# Patient Record
Sex: Male | Born: 1945 | Race: Black or African American | Hispanic: No | Marital: Married | Smoking: Never smoker
Health system: Southern US, Community
[De-identification: ages and names within clinical notes are randomized; demographics above are authoritative.]

## PROBLEM LIST (undated history)

## (undated) DIAGNOSIS — E119 Type 2 diabetes mellitus without complications: Secondary | ICD-10-CM

## (undated) DIAGNOSIS — F32A Depression, unspecified: Secondary | ICD-10-CM

## (undated) DIAGNOSIS — R062 Wheezing: Secondary | ICD-10-CM

## (undated) DIAGNOSIS — K219 Gastro-esophageal reflux disease without esophagitis: Secondary | ICD-10-CM

## (undated) DIAGNOSIS — I255 Ischemic cardiomyopathy: Secondary | ICD-10-CM

## (undated) DIAGNOSIS — Z95810 Presence of automatic (implantable) cardiac defibrillator: Secondary | ICD-10-CM

## (undated) DIAGNOSIS — I10 Essential (primary) hypertension: Secondary | ICD-10-CM

## (undated) DIAGNOSIS — R55 Syncope and collapse: Secondary | ICD-10-CM

## (undated) DIAGNOSIS — I429 Cardiomyopathy, unspecified: Secondary | ICD-10-CM

## (undated) DIAGNOSIS — J449 Chronic obstructive pulmonary disease, unspecified: Secondary | ICD-10-CM

## (undated) DIAGNOSIS — I214 Non-ST elevation (NSTEMI) myocardial infarction: Secondary | ICD-10-CM

## (undated) DIAGNOSIS — N289 Disorder of kidney and ureter, unspecified: Secondary | ICD-10-CM

## (undated) DIAGNOSIS — Z9289 Personal history of other medical treatment: Secondary | ICD-10-CM

## (undated) DIAGNOSIS — E785 Hyperlipidemia, unspecified: Secondary | ICD-10-CM

## (undated) DIAGNOSIS — I509 Heart failure, unspecified: Secondary | ICD-10-CM

## (undated) DIAGNOSIS — I2510 Atherosclerotic heart disease of native coronary artery without angina pectoris: Secondary | ICD-10-CM

## (undated) DIAGNOSIS — F419 Anxiety disorder, unspecified: Secondary | ICD-10-CM

## (undated) DIAGNOSIS — I428 Other cardiomyopathies: Secondary | ICD-10-CM

## (undated) HISTORY — PX: HERNIA REPAIR: SHX51

## (undated) HISTORY — PX: FRACTURE SURGERY: SHX138

## (undated) HISTORY — DX: Personal history of other medical treatment: Z92.89

## (undated) HISTORY — PX: REPAIR, HAMMERTOE: SHX5250

## (undated) HISTORY — PX: OTHER SURGICAL HISTORY: SHX169

## (undated) HISTORY — PX: CORRECTION HAMMER TOE: SUR317

## (undated) HISTORY — DX: Disorder of kidney and ureter, unspecified: N28.9

## (undated) HISTORY — DX: Other cardiomyopathies: I42.8

## (undated) HISTORY — DX: Gastro-esophageal reflux disease without esophagitis: K21.9

## (undated) HISTORY — PX: ECHOCARDIOGRAM, TRANSTHORACIC: SHX3784

## (undated) HISTORY — DX: Non-ST elevation (NSTEMI) myocardial infarction: I21.4

## (undated) HISTORY — PX: CIRCUMCISION: SUR203

## (undated) HISTORY — DX: Cardiomyopathy, unspecified: I42.9

---
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
## 1971-10-20 HISTORY — PX: OTHER SURGICAL HISTORY: SHX169

## 1973-10-19 HISTORY — PX: EGD: SHX3789

## 2007-10-20 HISTORY — PX: COLONOSCOPY: SHX174

## 2009-10-19 DIAGNOSIS — I219 Acute myocardial infarction, unspecified: Secondary | ICD-10-CM

## 2009-10-19 DIAGNOSIS — I2510 Atherosclerotic heart disease of native coronary artery without angina pectoris: Secondary | ICD-10-CM

## 2009-10-19 HISTORY — DX: Atherosclerotic heart disease of native coronary artery without angina pectoris: I25.10

## 2009-10-19 HISTORY — DX: Acute myocardial infarction, unspecified: I21.9

## 2010-03-18 ENCOUNTER — Emergency Department
Admission: EM | Admit: 2010-03-18 | Disposition: A | Discharge status: Home / Self Care | Payer: Self-pay | Source: Emergency Department | Admitting: Emergency Medicine

## 2010-03-19 ENCOUNTER — Inpatient Hospital Stay
Admission: EM | Admit: 2010-03-19 | Disposition: A | Discharge status: Home / Self Care | Payer: Self-pay | Source: Emergency Department

## 2010-03-19 HISTORY — PX: CARDIAC CATHETERIZATION: SHX172

## 2010-03-19 LAB — CK: Creatine Kinase (CK): 162 U/L (ref 19–216)

## 2010-03-19 LAB — COMPREHENSIVE METABOLIC PANEL
ALT: 20 U/L (ref 7–56)
AST (SGOT): 24 U/L (ref 5–40)
Albumin, Synovial: 4.5 g/dL (ref 3.9–5.0)
Alkaline Phosphatase: 108 U/L (ref 38–126)
BUN / Creatinine Ratio: 15 (ref 8–20)
BUN: 16 mg/dL (ref 6–20)
Bilirubin, Total: 0.4 mg/dL (ref 0.2–1.3)
CO2: 28 mmol/L (ref 21.0–31.0)
Calcium: 9.6 mg/dL (ref 8.4–10.2)
Chloride: 98 mmol/L — ABNORMAL LOW (ref 101–111)
Creatinine: 1.04 mg/dL (ref 0.66–1.25)
EGFR: >60 mL/min/{1.73_m2}
EGFR: >60 mL/min/{1.73_m2}
Glucose: 242 mg/dL — ABNORMAL HIGH (ref 70–100)
Potassium: 4.9 mmol/L (ref 3.6–5.0)
Protein, Total: 7.5 g/dL (ref 6.3–8.2)
Sodium: 138 mmol/L (ref 135–145)

## 2010-03-19 LAB — CBC AND DIFFERENTIAL
BASOPHILS %: 0.1 % (ref 0.0–2.0)
Baso(Absolute): 0.01 10*3/uL (ref 0.00–0.20)
Eosinophils %: 2.4 % (ref 0.0–6.0)
Eosinophils Absolute: 0.19 10*3/uL (ref 0.10–0.30)
Hematocrit: 42.8 % (ref 39.0–49.0)
Hemoglobin: 14.8 g/dL (ref 13.2–17.3)
Immature Granulocytes #: 0.01 10*3/uL (ref 0.00–0.05)
Immature Granulocytes %: 0.1 % — ABNORMAL HIGH (ref 0.0–0.0)
Lymphocytes Absolute: 3.5 10*3/uL (ref 1.00–4.80)
Lymphocytes Relative: 44.5 % (ref 25.0–55.0)
MCH: 31.3 pg (ref 27.0–34.0)
MCHC: 34.6 g/dL (ref 32.0–36.0)
MCV: 90.5 fL (ref 80–100)
MPV: 11.2 fL (ref 9.0–13.0)
Monocytes Absolute: 0.4 10*3/uL (ref 0.10–1.20)
Monocytes Relative %: 5.1 % (ref 1.0–8.0)
Neutrophils Absolute: 3.77 10*3/uL (ref 1.80–7.70)
Neutrophils Relative %: 47.9 % — ABNORMAL LOW (ref 49.0–69.0)
Nucleated RBC %: 0 /100WBC (ref 0.0–0.0)
Nucleted RBC #: 0 10*3/uL (ref 0.00–0.00)
Platelets: 158 10*3/uL (ref 150–400)
RBC: 4.73 M/uL (ref 3.80–5.40)
RDW: 12.5 % (ref 11.0–14.0)
WBC: 7.87 10*3/uL (ref 4.80–10.80)

## 2010-03-19 LAB — TROPONIN I: Troponin I: 0.25 ng/mL (ref 0.000–0.034)

## 2010-03-23 LAB — CK
Creatine Kinase (CK): 173 U/L (ref 19–216)
Creatine Kinase (CK): 120 U/L (ref 19–216)
Creatine Kinase (CK): 98 U/L (ref 19–216)
Creatine Kinase (CK): 81 U/L (ref 19–216)

## 2010-03-23 LAB — BASIC METABOLIC PANEL
BUN / Creatinine Ratio: 14 (ref 8–20)
BUN: 12 mg/dL (ref 6–20)
CO2: 25 mmol/L (ref 21.0–31.0)
Calcium: 8.8 mg/dL (ref 8.4–10.2)
Chloride: 103 mmol/L (ref 101–111)
Creatinine: 0.87 mg/dL (ref 0.66–1.25)
EGFR: >60 mL/min/{1.73_m2}
EGFR: >60 mL/min/{1.73_m2}
Glucose: 207 mg/dL — ABNORMAL HIGH (ref 70–100)
Potassium: 4.7 mmol/L (ref 3.6–5.0)
Sodium: 138 mmol/L (ref 135–145)

## 2010-03-23 LAB — GLYCOHEMOGLOBIN
Estimated Average Glucose: 249 mg/dL
Hemoglobin A1C: 10.3 % — ABNORMAL HIGH (ref 4.0–5.6)

## 2010-03-23 LAB — CBC AND DIFFERENTIAL
BASOPHILS %: 0.2 % (ref 0.0–2.0)
Baso(Absolute): 0.01 10*3/uL (ref 0.00–0.20)
Eosinophils %: 1.1 % (ref 0.0–6.0)
Eosinophils Absolute: 0.07 10*3/uL — ABNORMAL LOW (ref 0.10–0.30)
Hematocrit: 42.4 % (ref 39.0–49.0)
Hemoglobin: 14.5 g/dL (ref 13.2–17.3)
Immature Granulocytes #: 0.01 10*3/uL (ref 0.00–0.05)
Immature Granulocytes %: 0.2 % — ABNORMAL HIGH (ref 0.0–0.0)
Lymphocytes Absolute: 2.09 10*3/uL (ref 1.00–4.80)
Lymphocytes Relative: 32.1 % (ref 25.0–55.0)
MCH: 30.9 pg (ref 27.0–34.0)
MCHC: 34.2 g/dL (ref 32.0–36.0)
MCV: 90.4 fL (ref 80–100)
MPV: 12 fL (ref 9.0–13.0)
Monocytes Absolute: 0.4 10*3/uL (ref 0.10–1.20)
Monocytes Relative %: 6.1 % (ref 1.0–8.0)
Neutrophils Absolute: 3.95 10*3/uL (ref 1.80–7.70)
Neutrophils Relative %: 60.5 % (ref 49.0–69.0)
Nucleated RBC %: 0 /100WBC (ref 0.0–0.0)
Nucleted RBC #: 0 10*3/uL (ref 0.00–0.00)
Platelets: 172 10*3/uL (ref 150–400)
RBC: 4.69 M/uL (ref 3.80–5.40)
RDW: 12.8 % (ref 11.0–14.0)
WBC: 6.52 10*3/uL (ref 4.80–10.80)

## 2010-03-23 LAB — COMPREHENSIVE METABOLIC PANEL
ALT: 11 U/L (ref 7–56)
AST (SGOT): 38 U/L (ref 5–40)
Albumin, Synovial: 4.6 g/dL (ref 3.9–5.0)
Alkaline Phosphatase: 104 U/L (ref 38–126)
BUN / Creatinine Ratio: 20 (ref 8–20)
BUN: 16 mg/dL (ref 6–20)
Bilirubin, Total: 0.8 mg/dL (ref 0.2–1.3)
CO2: 26 mmol/L (ref 21.0–31.0)
Calcium: 9.7 mg/dL (ref 8.4–10.2)
Chloride: 100 mmol/L — ABNORMAL LOW (ref 101–111)
Creatinine: 0.81 mg/dL (ref 0.66–1.25)
EGFR: >60 mL/min/{1.73_m2}
EGFR: >60 mL/min/{1.73_m2}
Glucose: 381 mg/dL — ABNORMAL HIGH (ref 70–100)
Potassium: 5.5 mmol/L — ABNORMAL HIGH (ref 3.6–5.0)
Protein, Total: 7.6 g/dL (ref 6.3–8.2)
Sodium: 138 mmol/L (ref 135–145)

## 2010-03-23 LAB — TSH, 3RD GENERATION: TSH, 3rd Generation: 0.533 mIU/L (ref 0.465–4.680)

## 2010-03-23 LAB — LIPID PANEL
Cholesterol: 127 mg/dL — ABNORMAL LOW (ref 140–200)
HDL Cholesterol,  Direct: 36 mg/dL — ABNORMAL LOW (ref 40–200)
LDL: 68 mg/dL (ref 66–178)
Triglycerides: 115 mg/dL (ref 27–125)
VLDL: 23 mg/dL (ref 2–38)

## 2010-03-23 LAB — POTASSIUM: Potassium: 4.8 mmol/L (ref 3.6–5.0)

## 2010-03-23 LAB — TROPONIN I
Troponin I: 0.25 ng/mL (ref 0.000–0.034)
Troponin I: 0.211 ng/mL (ref 0.000–0.034)

## 2010-03-23 LAB — MYOGLOBIN, SERUM: Myoglobin: 57 ng/mL (ref 0–121)

## 2010-04-11 DIAGNOSIS — I214 Non-ST elevation (NSTEMI) myocardial infarction: Secondary | ICD-10-CM | POA: Insufficient documentation

## 2010-04-11 DIAGNOSIS — I119 Hypertensive heart disease without heart failure: Secondary | ICD-10-CM | POA: Insufficient documentation

## 2010-04-11 DIAGNOSIS — I2510 Atherosclerotic heart disease of native coronary artery without angina pectoris: Secondary | ICD-10-CM | POA: Insufficient documentation

## 2010-04-11 DIAGNOSIS — E7800 Pure hypercholesterolemia, unspecified: Secondary | ICD-10-CM | POA: Insufficient documentation

## 2010-05-20 ENCOUNTER — Observation Stay
Admission: EM | Admit: 2010-05-20 | Disposition: A | Discharge status: Home / Self Care | Payer: Self-pay | Source: Emergency Department | Admitting: Hospitalist

## 2010-05-20 LAB — GFR: EGFR: >60

## 2010-05-20 LAB — COMPREHENSIVE METABOLIC PANEL
ALT: 29 U/L (ref 3–36)
AST (SGOT): 26 U/L (ref 10–41)
Albumin/Globulin Ratio: 1.4 (ref 1.1–1.8)
Albumin: 4 g/dL (ref 3.4–4.9)
Alkaline Phosphatase: 97 U/L (ref 43–112)
BUN: 16 mg/dL (ref 8–20)
Bilirubin, Total: 0.9 mg/dL (ref 0.1–1.0)
CO2: 26 mEq/L (ref 21–30)
Calcium: 9 mg/dL (ref 8.6–10.2)
Chloride: 103 mEq/L (ref 98–107)
Creatinine: 0.8 mg/dL (ref 0.6–1.5)
Globulin: 2.9 g/dL (ref 2.0–3.7)
Glucose: 210 mg/dL — ABNORMAL HIGH (ref 70–100)
Potassium: 5.5 mEq/L — ABNORMAL HIGH (ref 3.6–5.0)
Protein, Total: 6.9 g/dL (ref 6.0–8.0)
Sodium: 139 mEq/L (ref 136–146)

## 2010-05-20 LAB — CBC AND DIFFERENTIAL
Baso(Absolute): 0.01 10*3/uL (ref 0.00–0.20)
Basophils: 0 % (ref 0–2)
Eosinophils Absolute: 0.04 10*3/uL (ref 0.00–0.70)
Eosinophils: 1 % (ref 0–5)
Hematocrit: 43 % (ref 42.0–52.0)
Hgb: 14.4 g/dL (ref 13.0–17.0)
Immature Granulocytes Absolute: 0.05 10*3/uL
Immature Granulocytes: 1 % (ref 0–1)
Lymphocytes Absolute: 0.29 10*3/uL — ABNORMAL LOW (ref 0.50–4.40)
Lymphocytes: 5 % — ABNORMAL LOW (ref 15–41)
MCH: 31 pg (ref 28.0–32.0)
MCHC: 33.5 g/dL (ref 32.0–36.0)
MCV: 92.5 fL (ref 80.0–100.0)
MPV: 11.6 fL (ref 9.4–12.3)
Monocytes Absolute: 0.28 10*3/uL (ref 0.00–1.20)
Monocytes: 5 % (ref 0–11)
Neutrophils Absolute: 5.44 10*3/uL
Neutrophils: 89 % — ABNORMAL HIGH (ref 52–75)
Platelets: 179 10*3/uL (ref 140–400)
RBC: 4.65 10*6/uL — ABNORMAL LOW (ref 4.70–6.00)
RDW: 13 % (ref 12–15)
WBC: 6.11 10*3/uL (ref 3.50–10.80)

## 2010-05-20 LAB — CK
Creatine Kinase (CK): 111 U/L (ref 20–297)
Creatine Kinase (CK): 81 U/L (ref 20–297)

## 2010-05-20 LAB — TROPONIN I: Troponin I: 0.01 ng/mL (ref 0.00–0.09)

## 2010-05-20 LAB — URINALYSIS, REFLEX TO MICROSCOPIC EXAM IF INDICATED
Bilirubin, UA: NEGATIVE
Blood, UA: NEGATIVE
Ketones UA: NEGATIVE
Leukocyte Esterase, UA: NEGATIVE
Nitrite, UA: NEGATIVE
Specific Gravity UA POCT: 1.024 (ref 1.001–1.035)
Urine pH: 5.5 (ref 5.0–8.0)
Urobilinogen, UA: NORMAL mg/dL

## 2010-05-20 LAB — PT AND APTT F/C
PT INR: 1 (ref 0.9–1.1)
PT: 13.8 s (ref 12.6–15.0)
PTT: 29 s (ref 23–37)

## 2010-05-20 LAB — I-STAT TROPONIN: i-STAT Troponin: 0.02 ng/mL (ref 0.00–0.09)

## 2010-05-21 LAB — TROPONIN I: Troponin I: 0.02 ng/mL (ref 0.00–0.09)

## 2010-05-21 LAB — CK: Creatine Kinase (CK): 65 U/L (ref 20–297)

## 2010-05-21 LAB — HEMOGLOBIN A1C: Hemoglobin A1C: 10.3 % — ABNORMAL HIGH (ref 0.0–6.0)

## 2010-10-19 DIAGNOSIS — I214 Non-ST elevation (NSTEMI) myocardial infarction: Secondary | ICD-10-CM

## 2010-10-19 HISTORY — DX: Non-ST elevation (NSTEMI) myocardial infarction: I21.4

## 2010-10-27 ENCOUNTER — Observation Stay
Admission: EM | Admit: 2010-10-27 | Disposition: A | Discharge status: Home / Self Care | Payer: Self-pay | Source: Emergency Department | Admitting: Gastroenterology

## 2010-10-27 LAB — GFR
EGFR: >60
EGFR: >60

## 2010-10-27 LAB — BASIC METABOLIC PANEL
BUN: 19 mg/dL (ref 8–20)
BUN: 15 mg/dL (ref 8–20)
CO2: 23 mEq/L (ref 21–30)
CO2: 25 mEq/L (ref 21–30)
Calcium: 9.1 mg/dL (ref 8.6–10.2)
Calcium: 8.4 mg/dL — ABNORMAL LOW (ref 8.6–10.2)
Chloride: 99 mEq/L (ref 98–107)
Chloride: 102 mEq/L (ref 98–107)
Creatinine: 0.9 mg/dL (ref 0.6–1.5)
Creatinine: 0.9 mg/dL (ref 0.6–1.5)
Glucose: 470 mg/dL — ABNORMAL HIGH (ref 70–100)
Glucose: 458 mg/dL — ABNORMAL HIGH (ref 70–100)
Potassium: 5.9 mEq/L — ABNORMAL HIGH (ref 3.6–5.0)
Potassium: 4.6 mEq/L (ref 3.6–5.0)
Sodium: 133 mEq/L — ABNORMAL LOW (ref 136–146)
Sodium: 136 mEq/L (ref 136–146)

## 2010-10-27 LAB — URINALYSIS, REFLEX TO MICROSCOPIC EXAM IF INDICATED
Bilirubin, UA: NEGATIVE
Blood, UA: NEGATIVE
Glucose, UA: >1000 — AB
Ketones UA: NEGATIVE
Leukocyte Esterase, UA: NEGATIVE
Nitrite, UA: NEGATIVE
Protein, UR: NEGATIVE
Specific Gravity UA POCT: 1.029 (ref 1.001–1.035)
Urine pH: 6 (ref 5.0–8.0)
Urobilinogen, UA: NORMAL mg/dL

## 2010-10-27 LAB — CBC AND DIFFERENTIAL
Baso(Absolute): 0.03 10*3/uL (ref 0.00–0.20)
Basophils: 0 % (ref 0–2)
Eosinophils Absolute: 0.09 10*3/uL (ref 0.00–0.70)
Eosinophils: 1 % (ref 0–5)
Hematocrit: 42.6 % (ref 42.0–52.0)
Hgb: 14.3 g/dL (ref 13.0–17.0)
Immature Granulocytes Absolute: 0.02 10*3/uL
Immature Granulocytes: 0 % (ref 0–1)
Lymphocytes Absolute: 1.79 10*3/uL (ref 0.50–4.40)
Lymphocytes: 28 % (ref 15–41)
MCH: 30.6 pg (ref 28.0–32.0)
MCHC: 33.6 g/dL (ref 32.0–36.0)
MCV: 91 fL (ref 80.0–100.0)
MPV: 12.2 fL (ref 9.4–12.3)
Monocytes Absolute: 0.3 10*3/uL (ref 0.00–1.20)
Monocytes: 5 % (ref 0–11)
Neutrophils Absolute: 4.11 10*3/uL
Neutrophils: 65 % (ref 52–75)
Platelets: 167 10*3/uL (ref 140–400)
RBC: 4.68 10*6/uL — ABNORMAL LOW (ref 4.70–6.00)
RDW: 13 % (ref 12–15)
WBC: 6.34 10*3/uL (ref 3.50–10.80)

## 2010-10-27 LAB — I-STAT TROPONIN: i-STAT Troponin: 0.01 ng/mL (ref 0.00–0.09)

## 2010-10-27 LAB — CK: Creatine Kinase (CK): 111 U/L (ref 20–297)

## 2010-10-28 LAB — BASIC METABOLIC PANEL
BUN: 16 mg/dL (ref 8–20)
CO2: 25 mEq/L (ref 21–30)
Calcium: 8.3 mg/dL — ABNORMAL LOW (ref 8.6–10.2)
Chloride: 101 mEq/L (ref 98–107)
Creatinine: 0.8 mg/dL (ref 0.6–1.5)
Glucose: 316 mg/dL — ABNORMAL HIGH (ref 70–100)
Potassium: 3.7 mEq/L (ref 3.6–5.0)
Sodium: 136 mEq/L (ref 136–146)

## 2010-10-28 LAB — CBC
Hematocrit: 39 % — ABNORMAL LOW (ref 42.0–52.0)
Hgb: 12.9 g/dL — ABNORMAL LOW (ref 13.0–17.0)
MCH: 30.4 pg (ref 28.0–32.0)
MCHC: 33.1 g/dL (ref 32.0–36.0)
MCV: 92 fL (ref 80.0–100.0)
MPV: 12.1 fL (ref 9.4–12.3)
Platelets: 150 10*3/uL (ref 140–400)
RBC: 4.24 10*6/uL — ABNORMAL LOW (ref 4.70–6.00)
RDW: 13 % (ref 12–15)
WBC: 6.13 10*3/uL (ref 3.50–10.80)

## 2010-10-28 LAB — PHOSPHORUS: Phosphorus: 3.4 mg/dL (ref 2.5–4.5)

## 2010-10-28 LAB — MAGNESIUM: Magnesium: 1.7 mg/dL (ref 1.6–2.3)

## 2010-10-28 LAB — HEMOGLOBIN A1C: Hemoglobin A1C: 11.2 % — ABNORMAL HIGH (ref 0.0–6.0)

## 2010-10-28 LAB — GFR: EGFR: >60

## 2010-10-29 LAB — CBC
Hematocrit: 39.2 % — ABNORMAL LOW (ref 42.0–52.0)
Hgb: 13.1 g/dL (ref 13.0–17.0)
MCH: 30.5 pg (ref 28.0–32.0)
MCHC: 33.4 g/dL (ref 32.0–36.0)
MCV: 91.2 fL (ref 80.0–100.0)
MPV: 12.5 fL — ABNORMAL HIGH (ref 9.4–12.3)
Platelets: 157 10*3/uL (ref 140–400)
RBC: 4.3 10*6/uL — ABNORMAL LOW (ref 4.70–6.00)
RDW: 13 % (ref 12–15)
WBC: 5.48 10*3/uL (ref 3.50–10.80)

## 2010-10-29 LAB — GFR: EGFR: >60

## 2010-10-29 LAB — BASIC METABOLIC PANEL
BUN: 9 mg/dL (ref 8–20)
CO2: 24 mEq/L (ref 21–30)
Calcium: 8.6 mg/dL (ref 8.6–10.2)
Chloride: 106 mEq/L (ref 98–107)
Creatinine: 0.7 mg/dL (ref 0.6–1.5)
Glucose: 210 mg/dL — ABNORMAL HIGH (ref 70–100)
Potassium: 4.3 mEq/L (ref 3.6–5.0)
Sodium: 138 mEq/L (ref 136–146)

## 2010-10-29 LAB — MAGNESIUM: Magnesium: 1.8 mg/dL (ref 1.6–2.3)

## 2011-06-29 LAB — ECG 12-LEAD
Atrial Rate: 78 {beats}/min
P Axis: 61 degrees
P-R Interval: 138 ms
Q-T Interval: 370 ms
QRS Duration: 86 ms
QTC Calculation (Bezet): 421 ms
R Axis: 16 degrees
T Axis: 31 degrees
Ventricular Rate: 78 {beats}/min

## 2011-07-07 LAB — ECG 12-LEAD
Atrial Rate: 108 {beats}/min
Atrial Rate: 82 {beats}/min
Atrial Rate: 96 {beats}/min
P Axis: 50 degrees
P Axis: 53 degrees
P Axis: 57 degrees
P-R Interval: 128 ms
P-R Interval: 132 ms
P-R Interval: 138 ms
Q-T Interval: 310 ms
Q-T Interval: 324 ms
Q-T Interval: 374 ms
QRS Duration: 82 ms
QRS Duration: 84 ms
QRS Duration: 90 ms
QTC Calculation (Bezet): 391 ms
QTC Calculation (Bezet): 434 ms
QTC Calculation (Bezet): 436 ms
R Axis: -11 degrees
R Axis: 4 degrees
R Axis: 8 degrees
T Axis: 106 degrees
T Axis: 69 degrees
T Axis: 71 degrees
Ventricular Rate: 108 {beats}/min
Ventricular Rate: 82 {beats}/min
Ventricular Rate: 96 {beats}/min

## 2011-07-22 NOTE — Discharge Summary (Signed)
RONITH, BERTI                                                    MRN:          [id number]                                                          Account:      [id number]                                                     Document ID:  [id number]                                                                                                                                   MRN: [id number]  Document ID: [id number]  Admit Date: 03/19/2010  Discharge Date: 03/21/2010     ATTENDING PHYSICIAN:  Sabino Gasser, MD        DISCHARGE DIAGNOSES:  1.  Non ST-segment myocardial infarction.  2.  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  3.  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  4.  Hypertension, uncontrolled.  5.  Diabetes, uncontrolled.  6.  Hypercholesterolemia, Zocor started.     HOSPITAL COURSE:  This is a 65 year old male who was admitted with a complaint of neck pain,  some dizziness and was found to have abnormal EKG and positive troponin.   He complained of dizziness, no vertiginous feeling; no chest pain,  palpitations, numbness, tingling, or any weakness.  He does complain of  posterior neck pain.  He thought he might have strained something but  symptoms persisted.  He came to the ER.  He also had slight fatigue.  On  initial evaluation, he had an abnormal EKG as well as a positive troponin.   He was admitted.       He was started on treatment.  He was treated with beta blocker,  nitroglycerin paste, aspirin, and Lovenox.  He was seen in consultation by  [facility].  He underwent a cardiac catheterization with the above  findings.  In the hospital, he remained asymptomatic only complaining of  mild right shoulder pain with movement; no limitation of activity, no chest  pain, shortness of breath, or any dizziness.  The patient is being  managed  medically with the addition of beta blocker, aspirin, as well as routine  medications and statins.  He is going to be followed by cardiology as an  outpatient.  Cardiology has already seen him and they have cleared him for  discharge.     Regarding hypertension, diabetes mellitus:  His medicines were changed  while in the hospital to sliding scale insulin along with Lantus and Coreg  was added.  I will start him back on Exforge along with glipizide.  I asked  him to hold his metformin for 48 hours before resuming it.  I will also  start him on Zocor for coronary disease.  His lipids:  Total cholesterol                                                                                                           Page 1 of 2  HANEEF, HALLQUIST                                                    MRN:          [id number]                                                          Account:      [id number]                                                     Document ID:  [id number]                                                                                                                                   was 127, LDL 68 and triglyceride 115.      DISCHARGE LABORATORIES:  BMP is unremarkable except glucose 207.  TSH was normal.  CBC was  unremarkable.     DISCHARGE MEDICATIONS:  See medication reconciliation form.     DISPOSITION:  1.  Discharge home and follow up with Dr. Zorita Pang, the patient's PCP,  in 1 week.  2.  Dr. Dorthula Nettles in 2 weeks with [facility].              D:  03/21/2010 11:35 AM by Alisa Graff, MD [phone])  T:  03/22/2010 10:20 AM by Georges Lynch      Everlean Cherry: [id number]) (Doc ID: [id number])           cc: Zorita Pang MD   Dorthula Nettles MD                                                                                                           Page 2 of 2  Authenticated by Alisa Graff, MD On 03/24/2010 11:27:27 AM

## 2011-07-22 NOTE — Op Note (Signed)
[name] [name], [name] [name]                                                    MRN:          [id number]                                                          Account:      [id number]                                                     Document ID:  [id number]                                               Procedure Date: 03/20/2010                                                                                    MRN: [id number]  Document ID: [id number]  Admit Date: 03/19/2010  Order #:      Patient Location: [id number]   Patient Type: I     ATTENDING PHYSICIAN: Sabino Gasser, MD     PROCEDURE PERFORMED BY: Debbe Odea MD        COMPLICATIONS:  None.     TITLE OF PROCEDURE:  Left heart catheterization, coronary arteriography, and left  ventriculography.     PREOPERATIVE DIAGNOSIS:  Coronary disease.     POSTOPERATIVE DIAGNOSIS:  Cardiomyopathy.     HISTORY:  This patient is a 66 year old gentleman without previous cardiac problems.   He does have multiple medical problems including hypertension, diabetes,  hyperlipidemia, and depression.  He was admitted with a several-day history  of intermittent dizziness, vague chest and back discomfort.  He was seen in  the emergency room and was discharged.  After his discharge, it was noted  that his troponin was abnormal.  The patient was called and returned to the  emergency room for admission.  Because of the abnormal troponin, cardiac  catheterization was recommended.     PAST MEDICAL HISTORY:  As noted above.  He has a history of a hernia repair, hammertoe surgery,  gastroesophageal reflux disease, and depression.     CARDIAC RISK FACTORS:  Diabetes, hypertension, hyperlipidemia.     ALLERGIES:  None.  Page 1 of 3  [name] [name], [name] [name]                                                    MRN:          [id number]                                                          Account:      [id number]                                                      Document ID:  [id number]                                               Procedure Date: 03/20/2010                                                                                       HOME MEDICATIONS:  Exforge 10/320 mg daily, Niaspan 500 mg daily, glipizide 10 mg daily,  metformin 1 gram b.i.d.     PHYSICAL EXAMINATION:  VITAL SIGNS:  Stable.  LUNGS:  Clear.  HEART:  S4.  EXTREMITIES:  Negative.     DESCRIPTION OF PROCEDURE:  After informed consent had been obtained, the patient was taken to the  cardiac catheterization lab where the right groin was prepped and draped in  the usual fashion.  After anesthetizing with local Xylocaine, a 6-French  Cordis sheath was placed in the right femoral artery using the Seldinger  technique.  A Judkins #4 left and right coronary catheters were utilized to  obtain selective cineangiograms of the left and right coronaries in several  projections.  A 6-French pigtail catheter was utilized for an RAO  ventriculogram, following which a pullback of pressures was obtained.  The  catheter and sheath were removed and hemostasis obtained using the  Angio-Seal technique.  The patient tolerated the procedure well and  returned to his room in good condition.     SEDATION:    The patient was sedated with Versed and fentanyl.       FINDINGS:  HEMODYNAMICS:  Left ventricular end-diastolic pressure is normal.  There is no gradient  across the aortic valve.     LEFT VENTRICLE:  The left ventricle is mildly enlarged with mild global left ventricular  systolic dysfunction.  Estimated ejection fraction is 40%.  There is no  evidence of mitral regurgitation.     RIGHT CORONARY:  The right groin is a small nondominant  vessel which is free of  atherosclerotic narrowings.     LEFT CORONARY ARTERY:  The left main trunk is normal.  The left anterior descending has a 70% to  80% lesion at the distal end of the vessel at the apex.  There  are  otherwise minor luminal irregularities.  The circumflex and its marginal                                                                                                           Page 2 of 3  [name] [name], [name] [name]                                                    MRN:          [id number]                                                          Account:      [id number]                                                     Document ID:  [id number]                                               Procedure Date: 03/20/2010                                                                                    branches have mild lumen irregularities.  There is a 20% lesion in the mid  circumflex.     FINAL DIAGNOSES:  1.  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  2.  Hypertension.  3.  Diabetes.  4.  Small-vessel coronary artery disease.  5.  Positive troponin due to \"demand ischemia.\"  6.  The patient has a history of poor compliance with his medical therapy.         RECOMMENDATIONS:   Strict blood pressure control.     PLAN:  The patient will continue his current medications.  We will add Coreg  6.25  mg b.i.d.  If all goes well, he will be discharged from the hospital in the  morning to follow up in the office in 2 to 3 weeks.  He will also follow up  with his primary physician, Dr. Zorita Pang.              D:  03/20/2010 15:45 PM by Debbe Odea, MD ([id number])  T:  03/21/2010 10:55 AM by Gwenyth Bender      Everlean Cherry: [id number]) (Doc ID: [id number])        cc: Zorita Pang MD                                                                                                           Page 3 of 3  Authenticated by Gilmer Mor, MD On 03/24/2010 04:20:33 PM

## 2011-07-22 NOTE — H&P (Signed)
WALTON, DIGILIO                                                    MRN:          [id number]                                                          Account:      [id number]                                                     Document ID:  [id number] 11 000000                                                                                                                                   MRN: [id number]  Document ID: [id number]  Admit Date: 03/19/2010     Patient Location: ERLL   Patient Type: E     ATTENDING PHYSICIAN: Melvern Sample, DO        HISTORY OF PRESENT ILLNESS:  The patient is a 65 year old male who comes in with a complaint of neck  pain and some dizziness and was found to have a positive troponin and  abnormal EKG.     The patient reports he was in his usual state of health this weekend when  he started having some dizziness, no vertigo, no chest pain, palpitations,  numbness, tingling, weakness.  He also noticed some posterior neck pain.   He thought it might have strained something, but symptoms persisted and  came to the emergency room yesterday.  He also had slight fatigue.  They  did some laboratories and initial evaluation was notable for abnormal EKG.   CK was negative.  Troponin was pending.  The patient was discharged;  however, troponin came back and was positive, and the patient was called to  return to the emergency room.     PAST MEDICAL HISTORY:  Diabetes, hypertension, depression.  No history of previous heart disease,  stroke, cancer, or lung disease.  He does have a history of peptic ulcer  disease diagnosed in 1973.     PAST SURGICAL HISTORY:  Includes left inguinal hernia repair and right hammertoe surgery.  He also  was treated with EGD and cauterization.     SOCIAL HISTORY:  He is married, works as a Airline pilot at [employer].  No tobacco, alcohol or drug  use.     ALLERGIES:  No known drug allergies.     MEDICATIONS ON ADMISSION:  Exforge 10/320 once a day, Niaspan at 500 mg at bedtime, glipizide 10 mg  at  bedtime, metformin 1000 mg twice a day.                                                                                                              Page 1 of 3  BREYDAN, SHILLINGBURG                                                    MRN:          [id number]                                                          Account:      [id number]                                                     Document ID:  [id number] 11 000000                                                                                                                                   FAMILY HISTORY:  Mother passed away at age 40 from heart disease.  She also had breast  cancer and DVTs.  Father passed away at age 3 secondary to complications  from Alcohol.  One brother had diabetes and calm and died from  complications at age 32.     REVIEW OF SYSTEMS:  A 12-point review of systems is essentially negative.  The patient denies  any recent loss of energy, fever, chills, sweats, nausea, vomiting,  diarrhea.  She has been feeling well until the symptoms this weekend.      PHYSICAL EXAMINATION:  GENERAL:  Well-developed, well-nourished male in no acute distress.  VITAL SIGNS:  Blood pressure 136/87, pulse 84, respiratory rate 18,  temperature is 96.8, pulse oximetry 97%.  HEENT:  Normocephalic, atraumatic.  Extraocular muscles intact.  Sclerae  anicteric.  Conjunctivae pink.  Oropharynx is clear.  NECK:  Supple, full range of motion, no bruit, thyromegaly, or adenopathy.  LUNGS:  Clear bilaterally to auscultation.  CARDIAC:  Regular rate and rhythm, normal S1, S2, no murmurs, rubs or  gallops.  ABDOMEN:  Soft, nontender, nondistended, positive bowel sounds, no  guarding, no rebound, no costovertebral angle tenderness.  EXTREMITIES:  No peripheral edema.  2+ peripheral pulses.  SKIN:  Warm and dry.  NEUROLOGIC:  Nonfocal.     LABORATORY DATA:  EKG done on Mar 18, 2010, shows normal sinus rhythm with inverted T-waves  in V3 through V6.  Nonspecific changes in 1  and aVL.  Cardiac enzymes  notable for troponin of 0.25.  CK and myoglobin negative.  Sodium was  elevated at 5.5, and a glucose of 381, BUN 16, creatinine 0.81.  CBC was  unremarkable.  Chest x-ray showed a questionable air under the right  hemidiaphragm examination is completely unremarkable.     ASSESSMENT AND PLAN:  The patient is a 65 year old male with positive troponin and abnormal EKG  despite no chest pain or other traditional cardiac symptoms which is not so  unusual in diabetic patient's.  He is currently pain free.  He does not  report any back pain, neck pain, dizziness, or other symptoms that he had  over the weekend.  We will admit him to telemetry, complete the serial  enzymes.  Start him on low-dose beta blocker if tolerated, nitro paste,  aspirin, and Lovenox.  Add Protonix for upper GI protection.   [facility] has already been notified by the  emergency room physician, Dr. Ree Edman and they will see him in the                                                                                                           Page 2 of 3  JOVAHN, BREIT                                                    MRN:          [id number]                                                          Account:      [id number]                                                     Document ID:  [id number] 11 000000  morning.  In terms of his diabetes, continue with the glipizide but hold  the dosage in the morning and check his blood sugars q.a.c. and adjust  according to sliding scale of NovoLog.  For risk stratification, check a  TSH and lipid profile in the morning and half schedule patient a 2D  echocardiogram to evaluate left ventricular function.  Blood pressure is  currently stable with current regimen.     No other issues at this time.              D:  03/19/2010 18:10 PM by  Aida Puffer, MD ([id number])  T:  03/19/2010 19:05 PM by Lynnell Grain      Everlean Cherry: [id number]) (Doc ID: [id number])        cc:                                                                                                            Page 3 of 3  Authenticated by Sabino Gasser, MD On 04/11/2010 02:50:14 PM

## 2011-07-22 NOTE — Consults (Signed)
[name] [name], [name] [name]                                                    MRN:          [id number]                                                          Account:      [id number]                                                     Document ID:  [id number]                                               Service Date: 03/20/2010                                                                                    MRN: [id number]  Document ID: [id number]  Admit Date: 03/19/2010     Patient Location: [id number]   Patient Type: I     CONSULTING PHYSICIAN: Joni Reining MD     REFERRING PHYSICIAN:         HISTORY OF PRESENT ILLNESS:  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  Some of it is exertional, while a good bit of it is atypical and  nonexertional.  He has also been experiencing excessive fatigue and  dizziness.  He ran out of his blood pressure medicines and presented to the  emergency room at [facility] on Mar 18, 2010 with significant  hypertension.  He was sent home with a troponin pending.  Subsequently, the  troponin came back negative.  Eventually, he was contacted and he came back  to the emergency department.  He currently is asymptomatic.  He denies any  PND, orthopnea, pedal edema, palpitations, syncope or presyncope.     MEDICATIONS:  At home include Exforge 10/320 daily, Niaspan 500 nightly, glipizide 10  nightly, metformin 1000 b.i.d.     PAST SURGICAL HISTORY:  Hernia repair, hammertoe surgery.     RISK FACTORS:  Type 2 diabetes, hypertension, hyperlipidemia.     FAMILY HISTORY:  Mother had coronary disease in her 64s.     SOCIAL HISTORY:  Occasional alcohol.     ALLERGIES:  None.     REVIEW OF SYSTEMS:  See history of present illness.  Other systems including constitutional,  Page 1 of  3  [name] [name]                                                    MRN:          [id number]                                                          Account:      [id number]                                                     Document ID:  [id number]                                               Service Date: 03/20/2010                                                                                    head, eyes, ears, nose and throat, GI, GU, musculoskeletal, skin,  respiratory, neurologic, psychiatric, endocrinologic systems negative.     PHYSICAL EXAMINATION:  VITAL SIGNS:  Blood pressure 138/84, pulse 62 and regular, respirations 16  and unlabored.  GENERAL:  Well-developed, well-nourished, alert and oriented x3.  HEENT:  No jugular venous distention or carotid bruits.  Conjunctivae pink.  LUNGS:  Clear.  CARDIAC:  PMI normal.  No murmurs, rubs, clicks or gallops.  ABDOMEN:  Nontender.  No hepatosplenomegaly or pulsatile mass.  EXTREMITIES:  No edema.  SKIN:  No cyanosis.  PULSES:  Radial, dorsalis pedis, and posterior tibial pulses 3+ and equal  bilaterally.  MUSCULOSKELETAL:  No deformities.     LABORATORY DATA:  EKG:  Normal sinus rhythm with anterior ST and T-wave changes consistent  with LVH and strain or possibly ischemia.  Chest x-ray:  No acute changes.     LABORATORY DATA:  White count 6,520, hematocrit 42.4, platelets 272,000.  Sodium 138,  potassium 5.5, chloride 100, bicarbonate 26, BUN 16, creatinine 0.81.   Troponin 0.25, 0.25, and 0.211.     IMPRESSION:  1.  Non-ST-segment elevation myocardial  infarction.  2.  Hypertension.  3.  Hyperlipidemia.  4.  Type 2 diabetes.       RECOMMENDATIONS:   In view of his clinical presentation and abnormal blood work and EKG, we  will proceed with cardiac catheterization and possible percutaneous  coronary intervention.  The risks including, but not limited to, death,  myocardial infarction, need for urgent CABG or vascular complications were  discussed  with the patient and he has  agreed to proceed.     Thank you for allowing me to participate in the care of this gentleman.              D:  03/20/2010 09:45 AM by Joni Reining, MD ([id number])                                                                                                           Page 2 of 3  [name] [name], [name] [name]                                                    MRN:          [id number]                                                          Account:      [id number]                                                     Document ID:  [id number]                                               Service Date: 03/20/2010                                                                                    T:  03/20/2010 17:35 PM by Nada Maclachlan      (Conf: [id number]) (Doc ID: [id number])        cc: Zorita Pang MD  Page 3 of 3  Authenticated by Gilmer Mor, MD On 03/24/2010 04:20:31 PM
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
## 2011-08-19 NOTE — H&P (Signed)
[name] [name], [name] [name]      MRN:          [id number]      Account:      [id number]      Document ID:  [id number]                  Admit Date: 10/27/2010            Patient Location: [facility]-01      Patient Type: V            ATTENDING PHYSICIAN: Malissa Hippo, MD                  HISTORY OF PRESENT ILLNESS:      This is a 65 year old male from [place] with a several-year history of      type 2 diabetes mellitus on oral medications, hypertension, hyperlipidemia,      coronary artery disease status post MI in July 2011, status post non-ST      elevation MI 2009, presented to [facility] with a 1-day history      of generalized headache and increased thirst.  The patient first woke up      this morning with a headache mainly frontal then becoming generalized and      also complained of having increased thirst with increased urination.  He is      known to have a history of type 2 diabetes mellitus on Janumet 1 tablet      p.o. every day, so he came to the ER and was noted to have a fingerstick of      411 mg/dL.  Repeat was 470 mg/dL.  He reports missing his Janumet      medication approximately once every week.  He did take his Janumet      yesterday, but none today.  His blood sugars are usually in the 150s.  He      has also been having sodas recently.  He had chocolate milk this morning      and last night and apple juice as well last night.  He received calcium      gluconate 1 gram IV.  Kayexalate 60 p.o. sodium bicarbonate, 6 units of      Novolin R and 50 cc D5W in the ER.            PAST MEDICAL HISTORY:      History of hyperlipidemia for several years on Zocor, dose unknown, takes      once at night.  Hypertension for several years on carvedilol, exact dose      unknown, noted to be b.i.d.  Also history of non-ST elevation MI ___ 2011,      on conservative management, carvedilol and simvastatin.  Diabetes mellitus      type 2 for several years on Janumet 1 tablet p.o. b.i.d.  He takes it once      a  day, and he misses about 1 day's dose a week.  He takes low dose aspirin      and Exforge.            ALLERGIES:      No known allergies.            DRUG HISTORY:      As above, nil else.            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                                         Page 1 of 3      [name], [name]      MRN:          [id number]      Account:      [id number]      Document ID:  [id number]                  PAST SURGICAL HISTORY:      Hernia at age 11, had ventral hernia repaired.  He had bilateral bilateral      leg fracture surgeries in his 24s.            FAMILY HISTORY:      Type 1 diabetes mellitus in brothers, died.  No hypertension,      hyperlipidemia, coronary artery disease he is aware of.            REVIEW OF SYSTEMS:      Reviewed negative except as in HPI.            PHYSICAL EXAMINATION:      GENERAL:  This is a middle-aged male, comfortable.      HEENT:  Mucous membranes pink and dry.      VITAL SIGNS:  Pulse rate was 76 per minute regular, respiratory rate 14 per      minute, blood pressure 158/80, temperature 98 degrees F.      CVS: S1, S2, no S3, no S4, no murmurs. Apex normal. JVP normal.      ABDOMEN:  Soft nontender, bowel sounds normal.      CENTRAL NERVOUS SYSTEM:  5/5 power in all 4 limbs, normal reflexes.            LABORATORY DATA:      WBC 6.2, hemoglobin 14.3, hematocrit 42.6, platelets 167.  Sodium 132,      potassium 4.9, chloride 99, bicarbonate 23, BUN 19 mg/dL, creatinine 0.9      mg/dL, calcium 9.1 mg/dL.  UA is negative.  Marked glucosuria.  EKG showed a      sinus rhythm and T-wave inversion in V4.  CT of head was done without contrast      and was normal.            ASSESSMENT:      This is a middle-aged male, history of type 2 diabetes mellitus,      hypertension, hyperlipidemia, coronary artery disease, status post not      taking his medicine, presenting with uncontrolled  hyperglycemia secondary      to poor diet-control.            PLAN:      We will admit this patient to regular medicine floor and on vital signs      routine.  Two gram sodium, low-fat, 1600 kilocalorie ADA carbohydrate      consistent cardiac diet.  Diabetic teaching to be done.  Nutrition consult.       He will be on Lantus 10 units subcutaneously nightly starting from tonight      with low-dose insulin sliding scale, continue his carvedilol 6.25 p.o.      every day, simvastatin, Zocor 40 p.o. nightly.  Kayexalate was given.  We  will repeat his chem-8 in 4 hours. Intravenous fluids 0.9% normal saline @ 100      to 125 cc an hour.  PT, OT consultation please. Continue aspirin 325 mg 1 p.o.      daily.  Deep venous thrombosis and gastrointestinal prophylaxis, sequential      compression devices.  Lovenox 40 mg subcutaneously daily and Protonix 40 mg      p.o. daily.  His primary medical                                   Page 2 of 3      ELIA, KEENUM      MRN:          [id number]      Account:      [id number]      Document ID:  [id number]                  doctor is Dr. Zorita Pang in [place].            R2:  jg 10/28/2010                        Electronic Signing Provider            D:  10/27/2010 20:00 PM by Dr. Therisa Doyne, MD ([id number])      T:  10/27/2010 20:45 PM by [id number]                  cc:                                   Page 3 of 3      Authenticated and Edited by Olena Leatherwood, MD ([id number]) On 10/29/10 5:55:15 PM

## 2012-07-14 LAB — ECG 12-LEAD
Atrial Rate: 61 {beats}/min
Atrial Rate: 63 {beats}/min
Atrial Rate: 86 {beats}/min
P Axis: 48 degrees
P Axis: 48 degrees
P Axis: 53 degrees
P-R Interval: 106 ms
P-R Interval: 108 ms
P-R Interval: 110 ms
Q-T Interval: 362 ms
Q-T Interval: 398 ms
Q-T Interval: 430 ms
QRS Duration: 80 ms
QRS Duration: 80 ms
QRS Duration: 84 ms
QTC Calculation (Bezet): 400 ms
QTC Calculation (Bezet): 433 ms
QTC Calculation (Bezet): 440 ms
R Axis: 2 degrees
R Axis: 6 degrees
R Axis: 6 degrees
T Axis: -28 degrees
T Axis: 126 degrees
T Axis: 179 degrees
Ventricular Rate: 61 {beats}/min
Ventricular Rate: 63 {beats}/min
Ventricular Rate: 86 {beats}/min

## 2012-10-19 DIAGNOSIS — Z950 Presence of cardiac pacemaker: Secondary | ICD-10-CM

## 2012-10-19 HISTORY — DX: Presence of cardiac pacemaker: Z95.0

## 2012-10-19 HISTORY — PX: CARDIAC PACEMAKER PLACEMENT: SHX583

## 2012-12-17 HISTORY — PX: CARDIAC DEFIBRILLATOR PLACEMENT: SHX171

## 2012-12-17 HISTORY — PX: CARDIAC CATHETERIZATION: SHX172

## 2013-02-14 DIAGNOSIS — I4729 Other ventricular tachycardia: Secondary | ICD-10-CM | POA: Insufficient documentation

## 2013-02-14 DIAGNOSIS — I472 Ventricular tachycardia: Secondary | ICD-10-CM | POA: Insufficient documentation

## 2013-12-29 ENCOUNTER — Observation Stay: Payer: Commercial Managed Care - POS | Admitting: Internal Medicine

## 2013-12-29 ENCOUNTER — Observation Stay
Admission: EM | Admit: 2013-12-29 | Discharge: 2013-12-30 | Disposition: A | Discharge status: Home / Self Care | Payer: Commercial Managed Care - POS | Attending: Internal Medicine | Admitting: Internal Medicine

## 2013-12-29 ENCOUNTER — Emergency Department: Payer: Self-pay

## 2013-12-29 DIAGNOSIS — E119 Type 2 diabetes mellitus without complications: Secondary | ICD-10-CM | POA: Insufficient documentation

## 2013-12-29 DIAGNOSIS — I252 Old myocardial infarction: Secondary | ICD-10-CM | POA: Insufficient documentation

## 2013-12-29 DIAGNOSIS — E118 Type 2 diabetes mellitus with unspecified complications: Secondary | ICD-10-CM | POA: Diagnosis present

## 2013-12-29 DIAGNOSIS — I2589 Other forms of chronic ischemic heart disease: Secondary | ICD-10-CM | POA: Insufficient documentation

## 2013-12-29 DIAGNOSIS — Z8249 Family history of ischemic heart disease and other diseases of the circulatory system: Secondary | ICD-10-CM | POA: Insufficient documentation

## 2013-12-29 DIAGNOSIS — I2510 Atherosclerotic heart disease of native coronary artery without angina pectoris: Secondary | ICD-10-CM | POA: Insufficient documentation

## 2013-12-29 DIAGNOSIS — Z23 Encounter for immunization: Secondary | ICD-10-CM | POA: Insufficient documentation

## 2013-12-29 DIAGNOSIS — R0789 Other chest pain: Principal | ICD-10-CM | POA: Insufficient documentation

## 2013-12-29 DIAGNOSIS — Z833 Family history of diabetes mellitus: Secondary | ICD-10-CM | POA: Insufficient documentation

## 2013-12-29 DIAGNOSIS — I10 Essential (primary) hypertension: Secondary | ICD-10-CM | POA: Insufficient documentation

## 2013-12-29 DIAGNOSIS — R079 Chest pain, unspecified: Secondary | ICD-10-CM | POA: Diagnosis present

## 2013-12-29 DIAGNOSIS — Z794 Long term (current) use of insulin: Secondary | ICD-10-CM | POA: Insufficient documentation

## 2013-12-29 DIAGNOSIS — Z95810 Presence of automatic (implantable) cardiac defibrillator: Secondary | ICD-10-CM | POA: Insufficient documentation

## 2013-12-29 HISTORY — DX: Atherosclerotic heart disease of native coronary artery without angina pectoris: I25.10

## 2013-12-29 HISTORY — DX: Ischemic cardiomyopathy: I25.5

## 2013-12-29 HISTORY — DX: Essential (primary) hypertension: I10

## 2013-12-29 LAB — CK: Creatine Kinase (CK): 224 U/L — ABNORMAL HIGH (ref 30–200)

## 2013-12-29 LAB — CBC AND DIFFERENTIAL
Basophils Absolute Automated: 0.01 10*3/uL (ref 0.00–0.20)
Basophils Automated: 0 %
Eosinophils Absolute Automated: 0.24 10*3/uL (ref 0.00–0.70)
Eosinophils Automated: 3 %
Hematocrit: 41.6 % — ABNORMAL LOW (ref 42.0–52.0)
Hgb: 13.8 g/dL (ref 13.0–17.0)
Immature Granulocytes Absolute: 0.01 10*3/uL
Immature Granulocytes: 0 %
Lymphocytes Absolute Automated: 2.7 10*3/uL (ref 0.50–4.40)
Lymphocytes Automated: 39 %
MCH: 31.3 pg (ref 28.0–32.0)
MCHC: 33.2 g/dL (ref 32.0–36.0)
MCV: 94.3 fL (ref 80.0–100.0)
MPV: 12.7 fL — ABNORMAL HIGH (ref 9.4–12.3)
Monocytes Absolute Automated: 0.67 10*3/uL (ref 0.00–1.20)
Monocytes: 10 %
Neutrophils Absolute: 3.34 10*3/uL (ref 1.80–8.10)
Neutrophils: 48 %
Nucleated RBC: 0 /100 WBC (ref 0–1)
Platelets: 164 10*3/uL (ref 140–400)
RBC: 4.41 10*6/uL — ABNORMAL LOW (ref 4.70–6.00)
RDW: 13 % (ref 12–15)
WBC: 6.97 10*3/uL (ref 3.50–10.80)

## 2013-12-29 LAB — ECG 12-LEAD
Atrial Rate: 69 {beats}/min
P Axis: 54 degrees
P-R Interval: 134 ms
Q-T Interval: 402 ms
QRS Duration: 88 ms
QTC Calculation (Bezet): 430 ms
R Axis: 1 degrees
T Axis: 91 degrees
Ventricular Rate: 69 {beats}/min

## 2013-12-29 LAB — BASIC METABOLIC PANEL
BUN: 12 mg/dL (ref 9.0–21.0)
CO2: 25 mEq/L (ref 22–29)
Calcium: 9.5 mg/dL (ref 8.5–10.5)
Chloride: 101 mEq/L (ref 98–107)
Creatinine: 1 mg/dL (ref 0.7–1.3)
Glucose: 176 mg/dL — ABNORMAL HIGH (ref 70–100)
Potassium: 4.1 mEq/L (ref 3.5–5.1)
Sodium: 134 mEq/L — ABNORMAL LOW (ref 136–145)

## 2013-12-29 LAB — I-STAT TROPONIN: i-STAT Troponin: 0.02 ng/mL (ref 0.00–0.09)

## 2013-12-29 LAB — GLUCOSE WHOLE BLOOD - POCT: Whole Blood Glucose POCT: 241 mg/dL — ABNORMAL HIGH (ref 70–100)

## 2013-12-29 LAB — GFR: EGFR: >60

## 2013-12-29 LAB — TSH: TSH: 0.45 u[IU]/mL (ref 0.35–4.94)

## 2013-12-29 LAB — CKMB: Creatinine Kinase MB (CKMB): 5.4 ng/mL — ABNORMAL HIGH (ref 0.0–4.9)

## 2013-12-29 LAB — TROPONIN I: Troponin I: 0.03 ng/mL (ref 0.00–0.09)

## 2013-12-29 MED ORDER — ACETAMINOPHEN 325 MG PO TABS
650.0000 mg | ORAL_TABLET | ORAL | Status: DC | PRN
Start: 2013-12-29 — End: 2013-12-30
  Administered 2013-12-30: 650 mg via ORAL
  Filled 2013-12-29: qty 2

## 2013-12-29 MED ORDER — INSULIN GLARGINE 100 UNIT/ML SC SOLN
40.0000 [IU] | Freq: Every evening | SUBCUTANEOUS | Status: DC
Start: 2013-12-29 — End: 2013-12-30
  Administered 2013-12-29: 40 [IU] via SUBCUTANEOUS
  Filled 2013-12-29: qty 40

## 2013-12-29 MED ORDER — SODIUM CHLORIDE 0.9 % IJ SOLN
3.00 mL | Freq: Three times a day (TID) | INTRAMUSCULAR | Status: DC
Start: 2013-12-29 — End: 2013-12-30
  Administered 2013-12-29 – 2013-12-30 (×2): 3 mL via INTRAVENOUS

## 2013-12-29 MED ORDER — SITAGLIPTIN PHOSPHATE 50 MG PO TABS
50.0000 mg | ORAL_TABLET | Freq: Every day | ORAL | Status: DC
Start: 2013-12-29 — End: 2013-12-30
  Administered 2013-12-29 – 2013-12-30 (×2): 50 mg via ORAL
  Filled 2013-12-29 (×2): qty 1

## 2013-12-29 MED ORDER — DEXTROSE 50 % IV SOLN
25.0000 mL | INTRAVENOUS | Status: DC | PRN
Start: 2013-12-29 — End: 2013-12-30

## 2013-12-29 MED ORDER — FAMOTIDINE 20 MG PO TABS
20.0000 mg | ORAL_TABLET | Freq: Two times a day (BID) | ORAL | Status: DC
Start: 2013-12-29 — End: 2013-12-30
  Administered 2013-12-29 – 2013-12-30 (×2): 20 mg via ORAL
  Filled 2013-12-29 (×2): qty 1

## 2013-12-29 MED ORDER — PNEUMOCOCCAL VAC POLYVALENT 25 MCG/0.5ML IJ INJ
0.5000 mL | INJECTION | INTRAMUSCULAR | Status: AC | PRN
Start: 2013-12-29 — End: 2013-12-29
  Administered 2013-12-29: 0.5 mL via INTRAMUSCULAR
  Filled 2013-12-29: qty 0.5

## 2013-12-29 MED ORDER — INSULIN ASPART 100 UNIT/ML SC SOLN
1.0000 [IU] | Freq: Three times a day (TID) | SUBCUTANEOUS | Status: DC | PRN
Start: 2013-12-29 — End: 2013-12-30
  Administered 2013-12-29: 2 [IU] via SUBCUTANEOUS

## 2013-12-29 MED ORDER — NITROGLYCERIN 2 % TD OINT
0.50 [in_us] | TOPICAL_OINTMENT | Freq: Once | TRANSDERMAL | Status: AC
Start: 2013-12-29 — End: 2013-12-29
  Administered 2013-12-29: 0.5 [in_us] via TOPICAL
  Filled 2013-12-29: qty 1

## 2013-12-29 MED ORDER — GLUCAGON HCL (RDNA) 1 MG IJ SOLR
1.0000 mg | INTRAMUSCULAR | Status: DC | PRN
Start: 2013-12-29 — End: 2013-12-30

## 2013-12-29 MED ORDER — AMLODIPINE BESYLATE 5 MG PO TABS
10.0000 mg | ORAL_TABLET | Freq: Every day | ORAL | Status: DC
Start: 2013-12-29 — End: 2013-12-30
  Administered 2013-12-30: 10 mg via ORAL
  Filled 2013-12-29 (×2): qty 2

## 2013-12-29 MED ORDER — ASPIRIN 81 MG PO CHEW
81.0000 mg | CHEWABLE_TABLET | Freq: Every day | ORAL | Status: DC
Start: 2013-12-29 — End: 2013-12-30
  Administered 2013-12-29 – 2013-12-30 (×2): 81 mg via ORAL
  Filled 2013-12-29 (×2): qty 1

## 2013-12-29 MED ORDER — VALSARTAN 160 MG PO TABS
320.0000 mg | ORAL_TABLET | Freq: Every day | ORAL | Status: DC
Start: 2013-12-29 — End: 2013-12-30
  Administered 2013-12-29 – 2013-12-30 (×2): 320 mg via ORAL
  Filled 2013-12-29 (×2): qty 2

## 2013-12-29 MED ORDER — GLUCOSE 40 % PO GEL
15.0000 g | ORAL | Status: DC | PRN
Start: 2013-12-29 — End: 2013-12-30

## 2013-12-29 MED ORDER — CARVEDILOL 6.25 MG PO TABS
6.2500 mg | ORAL_TABLET | Freq: Two times a day (BID) | ORAL | Status: DC
Start: 2013-12-29 — End: 2013-12-30
  Administered 2013-12-29 – 2013-12-30 (×2): 6.25 mg via ORAL
  Filled 2013-12-29 (×2): qty 1

## 2013-12-29 NOTE — Progress Notes (Signed)
Received report from noc nurse, states pt was admitted after 6pm.  A/O x4 no noted resp or cardiac distress denies CP/paplapitaqtions/angina 0/10. Went over PTA med list with patient, and versus current meds he is on here. Educated on meds. Is aware will ne NPO after midnight for cardiac consult. To have EKG in AM. Cont troponins and lipid profile.

## 2013-12-29 NOTE — ED Provider Notes (Signed)
Initial brief evaluation done in triage to expedite care. Pt presents to triage with lightheadedness and some SOB, with some chest pain earlier this morning. Recent changes to meds, including insulin at night. Followed by [place] [facility], although multiple procedures including AICD done in [place].     Juliane Poot, MD  12/29/13 [phone]

## 2013-12-29 NOTE — ED Notes (Signed)
Pt states he was in class at 1100 this morning and got light headed and states,  'felt a distinct punch, pt states it did not feel like his HA that he had last year'. Pt presents with stable vitals, alert and oriented x4, no chest pain at this time

## 2013-12-29 NOTE — ED Provider Notes (Signed)
Physician/Midlevel provider first contact with patient: 12/29/13 1401         History     Chief Complaint   Patient presents with   . Chest Pain   . Dizziness     HPI    [name] [name] is a 68 y.o. 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 Last year, he also had mild dizziness and diaphoresis with chest pain, but none today. Today, pain is non-radiating. No palpitations, diaphoresis, headache, new vision changes, dizziness, lightheadedness, or any other concerns. Currently asymptomatic at the ED. He had full strength ASA en route.     ZOX:WRUEAV, [name] Barry, MD  Cardiology: [name] [name]    Past Medical History   Diagnosis Date   . [name] attack    . Diabetes mellitus    . Hypertension        Past Surgical History   Procedure Date   . Bring back open [name]    . Hernia repair    . Duodenal ulcer    . Orthopedic surgery        History reviewed. No pertinent family history.    Social  History   Substance Use Topics   . Smoking status: Never Smoker    . Smokeless tobacco: Not on file   . Alcohol Use: No       .     No Known Allergies    Current/Home Medications    No medications on file        Review of Systems   Constitutional: Negative for fever, chills and diaphoresis.   HENT: Negative for congestion, hearing loss, rhinorrhea and sore throat.    Eyes: Negative for visual disturbance.   Respiratory: Positive for shortness of breath. Negative for cough.    Cardiovascular: Positive for chest pain.   Gastrointestinal: Positive for nausea. Negative for vomiting, abdominal pain, diarrhea and blood in stool.   Genitourinary: Negative for dysuria, frequency and hematuria.    Musculoskeletal: Negative for back pain and neck pain.   Skin: Negative for color change and rash.   Neurological: Negative for dizziness, weakness, light-headedness, numbness and headaches.       Physical Exam    BP: 160/93 mmHg, [name] Rate: 71 , Temp: 98 F (36.7 C), Resp Rate: 18 , SpO2: 98 %, Weight: 97.977 kg    Physical Exam   Nursing note and vitals reviewed.  Constitutional: He is oriented to person, place, and time. He appears well-developed and well-nourished. No distress.   HENT:   Head: Normocephalic and atraumatic.   Mouth/Throat: Oropharynx is clear and moist. No oropharyngeal exudate.   Eyes: EOM are normal. Pupils are equal, round, and reactive to light. Right eye exhibits no discharge. Left eye exhibits no discharge.   Neck: Normal range of motion. Neck supple.        No carotid bruits   Cardiovascular: Normal rate, regular rhythm, normal [name] sounds and intact distal pulses.  Exam reveals no gallop and no friction rub.    No murmur heard.  Pulmonary/Chest: Effort normal and breath sounds normal. No respiratory distress. He has no wheezes. He has no rales.  defib left chest   Abdominal: Soft. Bowel sounds are normal. He exhibits no distension. There is no tenderness. There is no rebound and no guarding.   Musculoskeletal: Normal range of motion. He exhibits no edema and no tenderness.   Neurological: He is alert and oriented to person, place, and time. He exhibits normal muscle tone. Coordination normal.   Skin: Skin is warm and dry. He is not diaphoretic.       MDM and ED Course     ED Medication Orders      Start     Status Ordering Provider    12/29/13 1518   nitroglycerin (NITRO-BID) 2 % ointment 0.5 inch   Once      Route: Topical  Ordered Dose: 0.5 inch         Last MAR action:  Given [name] [name]                 MDM    CP in man w hx cad  Concern for angina/mi.  Other poss etiologies include (not completely inclusive) gerd, pna, ptx,pneumomediastinum, arrhythmia (defibrillator  did not fire per pt)  Cardia monitor, ekg, card enzymes  cxr  nitropasate   anticipate hsopitalization  And this was discussed w patient    EKG  I have reviewed and interpreted the EKG 3:13 PM  NSR at rate of 69  Q waves in inferior leads  Non-Specific ST changes  No significant changes from 10/27/2010    Cardiac monitor  interpreted by me at 3:16 PM:  Sinus bradycardia at rate of 52.    Procedures    Clinical Impression & Disposition     Clinical Impression  Final diagnoses:   Chest pain        ED Disposition     Observation Admitting Physician: [name] [name] [[id number]]  Diagnosis: Chest pain [1190359]  Estimated Length of Stay: < 2 midnights  Tentative Discharge Plan?: Home or Self Care [1]  Patient Class: Observation [104]             New Prescriptions    No medications on file        Treatment Team: Scribe: [name] [name]    I was acting as a Neurosurgeon for [name] Larsson, MD on [facility] E  Treatment Team: Scribe: [name] [name]   I am the first provider for this patient and I personally performed the services documented. Treatment Team: Scribe: [name] [name], Wynelle Link is scribing for me on [facility] E. This note accurately reflects work and decisions made by me.  [name] Larsson, MD        [name] Larsson, MD  01/01/14 [phone]

## 2013-12-29 NOTE — ED Notes (Signed)
Trop: 0.02

## 2013-12-29 NOTE — H&P (Signed)
HISTORY & PHYSICAL     Date Time: 12/29/2013 6:14 PM  Patient Name: [name] [name], [name] [name]  DOB: 18-Nov-1945  MRN: [id number]  ADULT OBSERVATION UNIT [id number]  Attending Physician: Marikay Alar, MD  Primary Care Physician: Zorita Pang, MD  CHIEF COMPLAINT: chest pain  ASSESSMENT:    Atypical chest pain  PLAN:   Admit to Observation Services  CE x 3, telemetry, repeat ecg in AM  Cardiology consult-ML with [facility]  Resume regular medications  Add pepcid  NPO p MN                                                                                       Please See attending internal medicine note that follows this mid-level encounter note  Disposition:   Today's date: 12/29/2013  Admit Date: 12/29/2013  2:35 PM  Anticipated medical stability for discharge:Yellow - maybe tomorrow  Service status: Observation:patient is stable and improving.  Reason for ongoing hospitalization: trend CE, telemetry, [facility] cardiology consult (msg left)  Anticipated discharge needs: none  History of Presenting Illness:   [name] [name] is a 68 y.o. male with DM, HTN, HLP, MI x 2, implanted defibrillator and h/o demand ischemia who presents to the hospital with heartburn like chest pain about 10 min after eating followed by feeling dizzy, drowsy, weak and increased urination. Sx not like MI in past. Had not taken meds this AM. Had pacemaker interrogated by [facility] in Jan, is due for recheck and stress test in May.  Per pt recent labs few wks ago with A1c<7 and nl lipids.   Patient's cardiac risk factors are diabetes mellitus, dyslipidemia, hypertension, male gender.    Patient's risk factors for DVT/PE: none.    Previous cardiac testing includes: cardiac cath 2012.  Patient Active Problem List   Diagnosis   . Chest pain     Past Medical History   Diagnosis Date   . Heart attack 2011     and 2014   . Diabetes mellitus    . Hypertension    . Cardiac defibrillator in situ      Available old records reviewed, including:  EPIC    Past  Surgical History   Procedure Date   . Bring back open heart    . Hernia repair      LIH   . Duodenal ulcer 1973   . Orthopedic surgery      R foot corrective   . Tonsillectomy and adenoidectomy 1954     Family History   Problem Relation Age of Onset   . Breast cancer Mother    . Heart attack Mother 41   . Diabetes Brother      History     Social History   . Marital Status: Unknown     Spouse Name: Octavia     Number of Children: 0   . Years of Education: N/A     Occupational History   . 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 Smoking status: Never Smoker    . Smokeless tobacco: Never Used   . Alcohol Use:  No   . Drug Use: No   . Sexually Active: Not on file     Other Topics Concern   . Not on file     Social History Narrative   . No narrative on file     Allergies:   No Known Allergies  Medications:     Prior to Admission medications    Not on File     Review of Systems:   A comprehensive review of systems was negative except for: still feeling weak  All other systems were reviewed and negative  Physical Exam:   BP 141/90  Pulse 68  Temp 98 F (36.7 C)  Resp 18  Ht 1.829 m (6')  Wt 97.977 kg (216 lb)  BMI 29.29 kg/m2  SpO2 96%  General: awake, alert, oriented x 3; no acute distress.  Cardiovascular: regular rate and rhythm, no murmurs, rubs or gallops  Lungs: clear to auscultation bilaterally, without wheezing, rhonchi, or rales  Abdomen: soft, non-tender, non-distended; normoactive bowel sounds, no rebound or guarding  Extremities: no clubbing, cyanosis, or edema  Neuro: cranial nerves grossly intact, strength 5/5 in upper and lower extremities  Skin: no rashes or lesions noted  Labs/Imaging:   I have Personally Reviewed   Results     Procedure Component Value Units Date/Time    i-Stat Troponin [[id number]] Collected:12/29/13 1458     i-STAT Troponin 0.02 ng/mL Updated:12/29/13 1651    CK-MB [[id number]]  (Abnormal) Collected:12/29/13 1500     Creatinine Kinase MB (CKMB) 5.4 (H) ng/mL  Updated:12/29/13 1559    Basic Metabolic Panel (BMP) [[id number]]  (Abnormal) Collected:12/29/13 1500    Specimen Information:Blood Updated:12/29/13 1538     Glucose 176 (H) mg/dL      BUN 28.4 mg/dL      Creatinine 1.0 mg/dL      CALCIUM 9.5 mg/dL      Sodium 132 (L) mEq/L      Potassium 4.1 mEq/L      Chloride 101 mEq/L      CO2 25 mEq/L     CK with MB if indicated [[id number]]  (Abnormal) Collected:12/29/13 1500    Specimen Information:Blood Updated:12/29/13 1538     Creatine Kinase (CK) 224 (H) U/L     GFR [[id number]] Collected:12/29/13 1500     EGFR >60.0 Updated:12/29/13 1538    CBC with differential [[id number]]  (Abnormal) Collected:12/29/13 1500    Specimen Information:Blood / Blood Updated:12/29/13 1519     WBC 6.97 x10 3/uL      RBC 4.41 (L) x10 6/uL      Hgb 13.8 g/dL      Hematocrit 56.4 (L) %      MCV 94.3 fL      MCH 31.3 pg      MCHC 33.2 g/dL      RDW 13 %      Platelets 164 x10 3/uL      MPV 12.7 (H) fL      Neutrophils 48 %      Lymphocytes Automated 39 %      Monocytes 10 %      Eosinophils Automated 3 %      Basophils Automated 0 %      Immature Granulocyte 0 %      Nucleated RBC 0 /100 WBC      Neutrophils Absolute 3.34 x10 3/uL      Abs Lymph Automated 2.70 x10 3/uL      Abs Mono Automated 0.67 x10 3/uL  Abs Eos Automated 0.24 x10 3/uL      Absolute Baso Automated 0.01 x10 3/uL      Absolute Immature Granulocyte 0.01 x10 3/uL         ECG: NSR  Radiology:   Radiology Results (24 Hour)     Procedure Component Value Units Date/Time    Chest AP Portable [[id number]] Collected:12/29/13 1505    Order Status:Completed  Updated:12/29/13 1519    Narrative:    History: Chest pain.    FINDINGS: Portable chest was compared with 10/27/2010. Heart size and  pulmonary vascularity are normal. Left-sided transvenous pacemaking  device identified with a single lead at the SVC. No focal infiltrates or  pleural effusions are seen. No pneumothorax.      Impression:     No acute process in the chest.    Will Bonnet, MD   12/29/2013 3:15 PM          I have discussed with Attending: Dr Albina Billet    Rockwell [place], NP  Adult Observation Unit Nurse Practitioner  [phone] or [phone]    Attending Attestation:     I have seen and personally examined the patient.  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  It felt different than prior MI.  Was presyncope, and had to sit down.  Friend brought water and he improved over ~10 minutes.      Exam: NAD, AOx3, MMM, RRR s1/s2 no m/g/r, CTAB no w/r/r, abd nt/nd, no LE edema    Assessment:  1. Chest pain, atypical   2. Diabetes, insulin dependent  3. Hypertension  4. CAD s/p MI x2  5. Chronic systolic heart failure - no current EF available to review  6. AICD for low EF     Plan:  #R/o acs with serial CE  #cardiac telemetry  #[facility] overnight line called  #Was previosuly due for stress test in coming weeks.  Will make NPO after midnight in case cardiology wants to complete here.  #EKG in AM    Today's date: 12/29/2013    Marikay Alar, MD

## 2013-12-30 ENCOUNTER — Encounter: Payer: Self-pay | Admitting: Nurse Practitioner

## 2013-12-30 DIAGNOSIS — I219 Acute myocardial infarction, unspecified: Secondary | ICD-10-CM | POA: Insufficient documentation

## 2013-12-30 DIAGNOSIS — Z794 Long term (current) use of insulin: Secondary | ICD-10-CM | POA: Diagnosis present

## 2013-12-30 DIAGNOSIS — I2510 Atherosclerotic heart disease of native coronary artery without angina pectoris: Secondary | ICD-10-CM | POA: Diagnosis present

## 2013-12-30 DIAGNOSIS — E118 Type 2 diabetes mellitus with unspecified complications: Secondary | ICD-10-CM | POA: Diagnosis present

## 2013-12-30 LAB — LIPID PANEL
Cholesterol / HDL Ratio: 4.9
Cholesterol: 152 mg/dL (ref 0–199)
HDL: 31 mg/dL — ABNORMAL LOW (ref >40)
LDL Calculated: 87 mg/dL (ref 0–99)
Triglycerides: 172 mg/dL — ABNORMAL HIGH (ref 34–149)
VLDL Calculated: 34 mg/dL (ref 10–40)

## 2013-12-30 LAB — TROPONIN I: Troponin I: 0.03 ng/mL (ref 0.00–0.09)

## 2013-12-30 LAB — HEMOLYSIS INDEX: Hemolysis Index: 19 — ABNORMAL HIGH (ref 0–18)

## 2013-12-30 LAB — GLUCOSE WHOLE BLOOD - POCT: Whole Blood Glucose POCT: 134 mg/dL — ABNORMAL HIGH (ref 70–100)

## 2013-12-30 MED ORDER — ASPIRIN 81 MG PO CHEW
81.0000 mg | CHEWABLE_TABLET | Freq: Every day | ORAL | Status: DC
Start: 2013-12-30 — End: 2017-08-30

## 2013-12-30 NOTE — Progress Notes (Signed)
A/O x4 no noted resp or cardiac distress. Cont to be SR/paced/AICD, denies CP/paplapitaqtions/angina 0/10.

## 2013-12-30 NOTE — Discharge Instructions (Signed)
Diet: Diabetic Diet  Activity: As tolerated     Lifestyle Recommendations  American Heart Association (AHA)/American College of Cardiology [facility]) guidelines stress the importance of lifestyle modifications to lower cardiovascular disease risk. This includes eating a heart-healthy diet, regular aerobic exercises, maintenance of desirable body weight and avoidance of tobacco products.     May take ibuprofen, Aleve or Advil for chest  discomfort. Follow directions on label.    If you have recurrent severe chest pain, especially if associated with nausea, vomiting or shortness of breath then go to nearest Emergency Room.    Please bring your medication in bottles or a list of medications, with name, dosage, and frequency to all your follow up appointments   Please Follow up with PCP It's important to be seen by a healthcare provider for annual age related screenings.    ]

## 2013-12-30 NOTE — Progress Notes (Signed)
Patient complained of 3/10 headache. Tylenol 650 mg po given. Will monitor for relief. Needs attended to. Advised on NPO for a cardiology consult

## 2013-12-30 NOTE — Consults (Signed)
[facility] CARDIOLOGY CONSULTATION REPORT  [facility]    Date Time: 12/30/2013 8:42 AM  Patient Name: [name] [name]  Requesting Physician: Arizona Constable, MD       Reason for Consultation:   CP      History:   [name] [name] is a 68 y.o. male admitted on 12/29/2013, for whom we are asked to provide cardiac consultation, regarding CP.  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  At the time he had an ICD placed due to syncope.  He also has DM and HTN.  Yesterday while at work, around early afternoon he developed dizziness, lightheadedness, and some burning chest pressure.  Symptoms lasted on and off for around 30 minutes before 911 could be called.  He felt better after NTG and ASA, as well as after he drank some juice.  Currently he is CP free and feeling better.  Overnight he was stable and ruled out for an MI.       Past Medical History:     Past Medical History   Diagnosis Date   . Heart attack 2011     and 2014   . Diabetes mellitus    . Hypertension    . Cardiac defibrillator in situ        Past Surgical History:     Past Surgical History   Procedure Date   . Bring back open heart    . Hernia repair      LIH   . Duodenal ulcer 1973   . Orthopedic surgery      R foot corrective   . Tonsillectomy and adenoidectomy 1954       Family History:     Family History   Problem Relation Age of Onset   . Breast cancer Mother    . Heart attack Mother 8   . Diabetes Brother        Social History:     History     Social History   . Marital Status: Unknown     Spouse Name: Octavia     Number of Children: 0   . Years of Education: N/A     Occupational History   . 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 Smoking status: Never Smoker    . Smokeless tobacco: Never Used   . Alcohol Use: No   . Drug Use: No   . Sexually Active: No     Other Topics Concern   . Not on file     Social History Narrative   . No narrative on file        Allergies:   No Known Allergies    Medications:     Prescriptions prior to admission   Medication Sig   . amlodipine-valsartan (EXFORGE) 10-160 MG per tablet Take 1 tablet by mouth daily.   . carvedilol (COREG) 6.25 MG tablet Take 6.25 mg by mouth 2 (two) times daily with meals.   . insulin glargine (LANTUS) 100 UNIT/ML injection Inject 45 Units into the skin nightly.   . metFORMIN (GLUCOPHAGE) 500 MG tablet Take 500 mg by mouth 2 (two) times daily with meals.       Current Facility-Administered Medications   Medication Dose Route Frequency Provider Last Rate Last Dose   . acetaminophen (TYLENOL) tablet 650 mg  650 mg Oral Q4H PRN Arne [place],  Lance Morin, NP   650 mg at 12/30/13 [phone]   . amLODIPine (NORVASC) tablet 10 mg  10 mg Oral Daily Rockwell [place], NP       . aspirin chewable tablet 81 mg  81 mg Oral Daily Rockwell [place], NP   81 mg at 12/29/13 2250   . carvedilol (COREG) tablet 6.25 mg  6.25 mg Oral Q12H [facility] Rockwell [place], NP   6.25 mg at 12/29/13 2247   . dextrose (GLUCOSE) 40 % oral gel 15 g  15 g Oral PRN Rockwell [place], NP       . dextrose 50 % bolus 25 mL  25 mL Intravenous PRN Rockwell [place], NP       . famotidine (PEPCID) tablet 20 mg  20 mg Oral Q12H [facility] Rockwell [place], NP   20 mg at 12/29/13 2249   . glucagon (rDNA) (GLUCAGEN) injection 1 mg  1 mg Intramuscular PRN Rockwell [place], NP       . insulin aspart (NovoLOG) injection 1-5 Units  1-5 Units Subcutaneous TID AC PRN Rockwell [place], NP   2 Units at 12/29/13 2255   . insulin glargine (LANTUS) injection 40 Units  40 Units Subcutaneous QHS Rockwell [place], NP   40 Units at 12/29/13 2252   . [COMPLETED] nitroglycerin (NITRO-BID) 2 % ointment 0.5 inch  0.5 inch Topical Once Noreene Larsson, MD   0.5 inch at 12/29/13 1540   . [COMPLETED] pneumococcal vaccine-23 (PNEUMOVAX 23) injection 0.5 mL  0.5 mL Intramuscular Prior to discharge Marikay Alar, MD   0.5 mL at 12/29/13 2302   . sitaGLIPtin  (JANUVIA) tablet 50 mg  50 mg Oral Daily Rockwell [place], NP   50 mg at 12/29/13 2249   . sodium chloride (PF) 0.9 % flush 3 mL  3 mL Intravenous Q8H Rockwell , NP   3 mL at 12/29/13 2303   . valsartan (DIOVAN) tablet 320 mg  320 mg Oral Daily Rockwell , NP   320 mg at 12/29/13 2247         Review of Systems:    Comprehensive review of systems including constitutional, eyes, ears, nose, mouth, throat, cardiovascular, GI, GU, musculoskeletal, integumentary, respiratory, neurologic, psychiatric, and endocrine is negative other than what is mentioned already in the history of present illness    Physical Exam:     Filed Vitals:    12/30/13 0320   BP: 138/81   Pulse: 65   Temp: 97.2 F (36.2 C)   Resp: 18   SpO2: 96%     Temp (24hrs), Avg:96.8 F (36 C), Min:95.2 F (35.1 C), Max:98 F (36.7 C)      Intake and Output Summary (Last 24 hours) at Date Time  No intake or output data in the 24 hours ending 12/30/13 0842    GENERAL: Patient is in no acute distress   HEENT: No scleral icterus or conjunctival pallor, moist mucous membranes   NECK: No jugular venous distention or thyromegaly, normal carotid upstrokes without bruits   CARDIAC: Normal apical impulse, regular rate and rhythm, with normal S1 and S2, and no murmurs, rubs, or gallops   CHEST: Clear to auscultation bilaterally, normal respiratory effort  ABDOMEN: No abdominal bruits, masses, or hepatosplenomegaly, nontender, non-distended, good bowel sounds   EXTREMITIES: No clubbing, cyanosis, or edema, 2+ DP, PT, and femoral pulses bilaterally without bruits  SKIN: No rash or jaundice   NEUROLOGIC: Alert and oriented to  time, place and person, normal mood and affect  MUSCULOSKELETAL: Normal muscle strength and tone.      Labs Reviewed:       Lab 12/30/13 0324 12/29/13 2108 12/29/13 1500   CK -- -- 224*   TROPI 0.03 0.03 --   TROPT -- -- --   CKMBINDEX -- -- --     No results found for this basename: DIG in the last 168 hours    Lab  12/30/13 0324   CHOL 152   TRIG 172*   HDL 31*   LDL 87     No results found for this basename: BILITOTAL,BILIDIRECT,PROT,ALB,ALT,AST in the last 168 hours  No results found for this basename: MG in the last 168 hours  No results found for this basename: PT,INR,PTT in the last 168 hours    Lab 12/29/13 1500   WBC 6.97   HGB 13.8   HCT 41.6*   PLT 164       Lab 12/29/13 1500   NA 134*   K 4.1   CL 101   CO2 25   BUN 12.0   CREAT 1.0   EGFR >60.0   GLU 176*   CA 9.5         Radiology   Radiological Procedure reviewed.      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  No changes to medications for now            Signed by: Coralyn Pear, MD      [facility]  NP Spectralink [phone] (8am-5pm)  MD Spectralink [phone] (8am-5pm)  After hours, non urgent consult line [phone]  After Hours, urgent consults [phone]

## 2013-12-30 NOTE — Discharge Summary (Signed)
Pt seen and examined seen by cardiology cleared agree with the plan

## 2013-12-30 NOTE — Progress Notes (Signed)
Pt d/c home today with ASA 81 mg daily. D/c instructions given and pt  verbalized understanding. Tele, PIV d/c'ed. W\c requested to escort to lobby and driven home by spouse.

## 2013-12-30 NOTE — Discharge Summary (Signed)
PROGRESS NOTE/DISCHARGE SUMMARY  Date Time  12/30/2013 9:29 AM   Patient Name  [name] [name], [name] [name]   DOB  03-08-1946   [id number]   MRN  [id number]   Attending Physician:  Ari[place] Constable, MD   Primary Care Physician  Zorita Pang, MD     Date of Admission:   12/29/2013  Date of Discharge:   12/30/2013   Discharge Dx:    Principal Problem:   *Chest pain  Active Problems:   Diabetes mellitus   Ischemic cardiomyopathy   CAD (coronary artery disease)   Hypertension    Disposition:   home with family  Current Discharge Medication List      START taking these medications    Details   aspirin 81 MG chewable tablet Chew 1 tablet (81 mg total) by mouth daily.  Refills: 0         CONTINUE these medications which have NOT CHANGED    Details   amlodipine-valsartan (EXFORGE) 10-160 MG per tablet Take 1 tablet by mouth daily.      carvedilol (COREG) 6.25 MG tablet Take 6.25 mg by mouth 2 (two) times daily with meals.      insulin glargine (LANTUS) 100 UNIT/ML injection Inject 45 Units into the skin nightly.      metFORMIN (GLUCOPHAGE) 500 MG tablet Take 500 mg by mouth 2 (two) times daily with meals.                             Consultations:   Treatment Team: Attending Provider: Arizona Constable, MD; Registered Nurse: Delmer Islam, RN   Pending Results, Recommendations & Instructions to providers after discharge:   1. Cardiology Follow up For Stress Testing  Procedures/Radiology performed:     Results     Procedure Component Value Units Date/Time    Glucose Whole Blood - POCT [[id number]]  (Abnormal) Collected:12/30/13 0839     POCT - Glucose Whole blood 134 (H) mg/dL MVHQION:62/95/28 4132    Lipid panel [[id number]]  (Abnormal) Collected:12/30/13 0324    Specimen Information:Blood Updated:12/30/13 0545     Cholesterol 152 mg/dL      Triglycerides 366 (H) mg/dL      HDL 31 (L) mg/dL      LDL Calculated 87 mg/dL      VLDL Cholesterol Cal 34 mg/dL      CHOL/HDL Ratio 4.9     Hemolysis index [[id number]]  (Abnormal) Collected:12/30/13  0324     Hemolysis Index 19 (H) Updated:12/30/13 0545    Troponin I [[id number]] Collected:12/30/13 0324    Specimen Information:Blood Updated:12/30/13 0418     Troponin I 0.03 ng/mL     Glucose Whole Blood - POCT [[id number]]  (Abnormal) Collected:12/29/13 2242     POCT - Glucose Whole blood 241 (H) mg/dL ZYSAYTK:16/01/09 3235    Troponin I [[id number]] Collected:12/29/13 2108    Specimen Information:Blood Updated:12/29/13 2159     Troponin I 0.03 ng/mL     TSH [[id number]] Collected:12/29/13 1500    Specimen Information:Blood Updated:12/29/13 1907     Thyroid Stimulating Hormone 0.45 uIU/mL     i-Stat Troponin [[id number]] Collected:12/29/13 1458     i-STAT Troponin 0.02 ng/mL Updated:12/29/13 1651    CK-MB [[id number]]  (Abnormal) Collected:12/29/13 1500     Creatinine Kinase MB (CKMB) 5.4 (H) ng/mL Updated:12/29/13 1559    Basic Metabolic Panel (BMP) [[id number]]  (Abnormal) Collected:12/29/13 1500    Specimen Information:Blood Updated:12/29/13 1538     Glucose  176 (H) mg/dL      BUN 28.4 mg/dL      Creatinine 1.0 mg/dL      CALCIUM 9.5 mg/dL      Sodium 132 (L) mEq/L      Potassium 4.1 mEq/L      Chloride 101 mEq/L      CO2 25 mEq/L     CK with MB if indicated [[id number]]  (Abnormal) Collected:12/29/13 1500    Specimen Information:Blood Updated:12/29/13 1538     Creatine Kinase (CK) 224 (H) U/L     GFR [[id number]] Collected:12/29/13 1500     EGFR >60.0 Updated:12/29/13 1538    CBC with differential [[id number]]  (Abnormal) Collected:12/29/13 1500    Specimen Information:Blood / Blood Updated:12/29/13 1519     WBC 6.97 x10 3/uL      RBC 4.41 (L) x10 6/uL      Hgb 13.8 g/dL      Hematocrit 56.4 (L) %      MCV 94.3 fL      MCH 31.3 pg      MCHC 33.2 g/dL      RDW 13 %      Platelets 164 x10 3/uL      MPV 12.7 (H) fL      Neutrophils 48 %      Lymphocytes Automated 39 %      Monocytes 10 %      Eosinophils Automated 3 %      Basophils Automated 0 %      Immature Granulocyte 0 %      Nucleated RBC 0 /100 WBC       Neutrophils Absolute 3.34 x10 3/uL      Abs Lymph Automated 2.70 x10 3/uL      Abs Mono Automated 0.67 x10 3/uL      Abs Eos Automated 0.24 x10 3/uL      Absolute Baso Automated 0.01 x10 3/uL      Absolute Immature Granulocyte 0.01 x10 3/uL          IMAGING  Radiology Results (24 Hour)     Procedure Component Value Units Date/Time    Chest AP Portable [[id number]] Collected:12/29/13 1505    Order Status:Completed  Updated:12/29/13 1519    Narrative:    History: Chest pain.    FINDINGS: Portable chest was compared with 10/27/2010. Heart size and  pulmonary vascularity are normal. Left-sided transvenous pacemaking  device identified with a single lead at the SVC. No focal infiltrates or  pleural effusions are seen. No pneumothorax.      Impression:     No acute process in the chest.    Will Bonnet, MD   12/29/2013 3:15 PM                Hospital Course:   Reason for admission/ HPI:  SRIRAM FEBLES is a 68 y.o. male was admitted under observation for chest pain  Hospital Course:Camara E Gama he had serial cardiac enzymes which were negative for ischemia. He was sen in consult by cardiology who recommended outpatient follow up for stress testing.   Currently the patient is hemodynamically stable for discharge with outpatient follow up as outlined below.  DISCHARGE DAY EXAM:  BP 124/84  Pulse 74  Temp 97 F (36.1 C) (Oral)  Resp 18  Ht 1.829 m (6')  Wt 97.977 kg (216 lb)  BMI 29.29 kg/m2  SpO2 98%  Body mass index is 29.29 kg/(m^2).   General: awake, alert, oriented x 3;  no acute distress.     Discharge Instructions:   Diet: Cardiac Diet  Activity: as tolerated  Follow up with Zorita Pang, MD. In 1 week. Specialty: Family Medicine Contact information: [phone]   [address]   [place] [address]   [phone]   Follow up with Coralyn Pear, MD. (office will contact you for follow up testing) Specialties: Cardiology, Internal Medicine Contact information: [phone] [facility]   [address]   [place] [address]    [phone]   Signed by: Silvano Rusk, ACNP  [facility]   ADULT OBSERVATION UNIT [phone] F: [phone]  CC: Zorita Pang, MD  Please see attending note that follows this mid-level encounter note.

## 2014-03-31 IMAGING — CR DG CHEST 2V
2 series · 2 of 2 positions shown · non-contrast
Comparison: Chest x-ray 12/31/2012.

CLINICAL DATA: Pacemaker recently placed.

CHEST - 2 VIEW

[w chest pa]
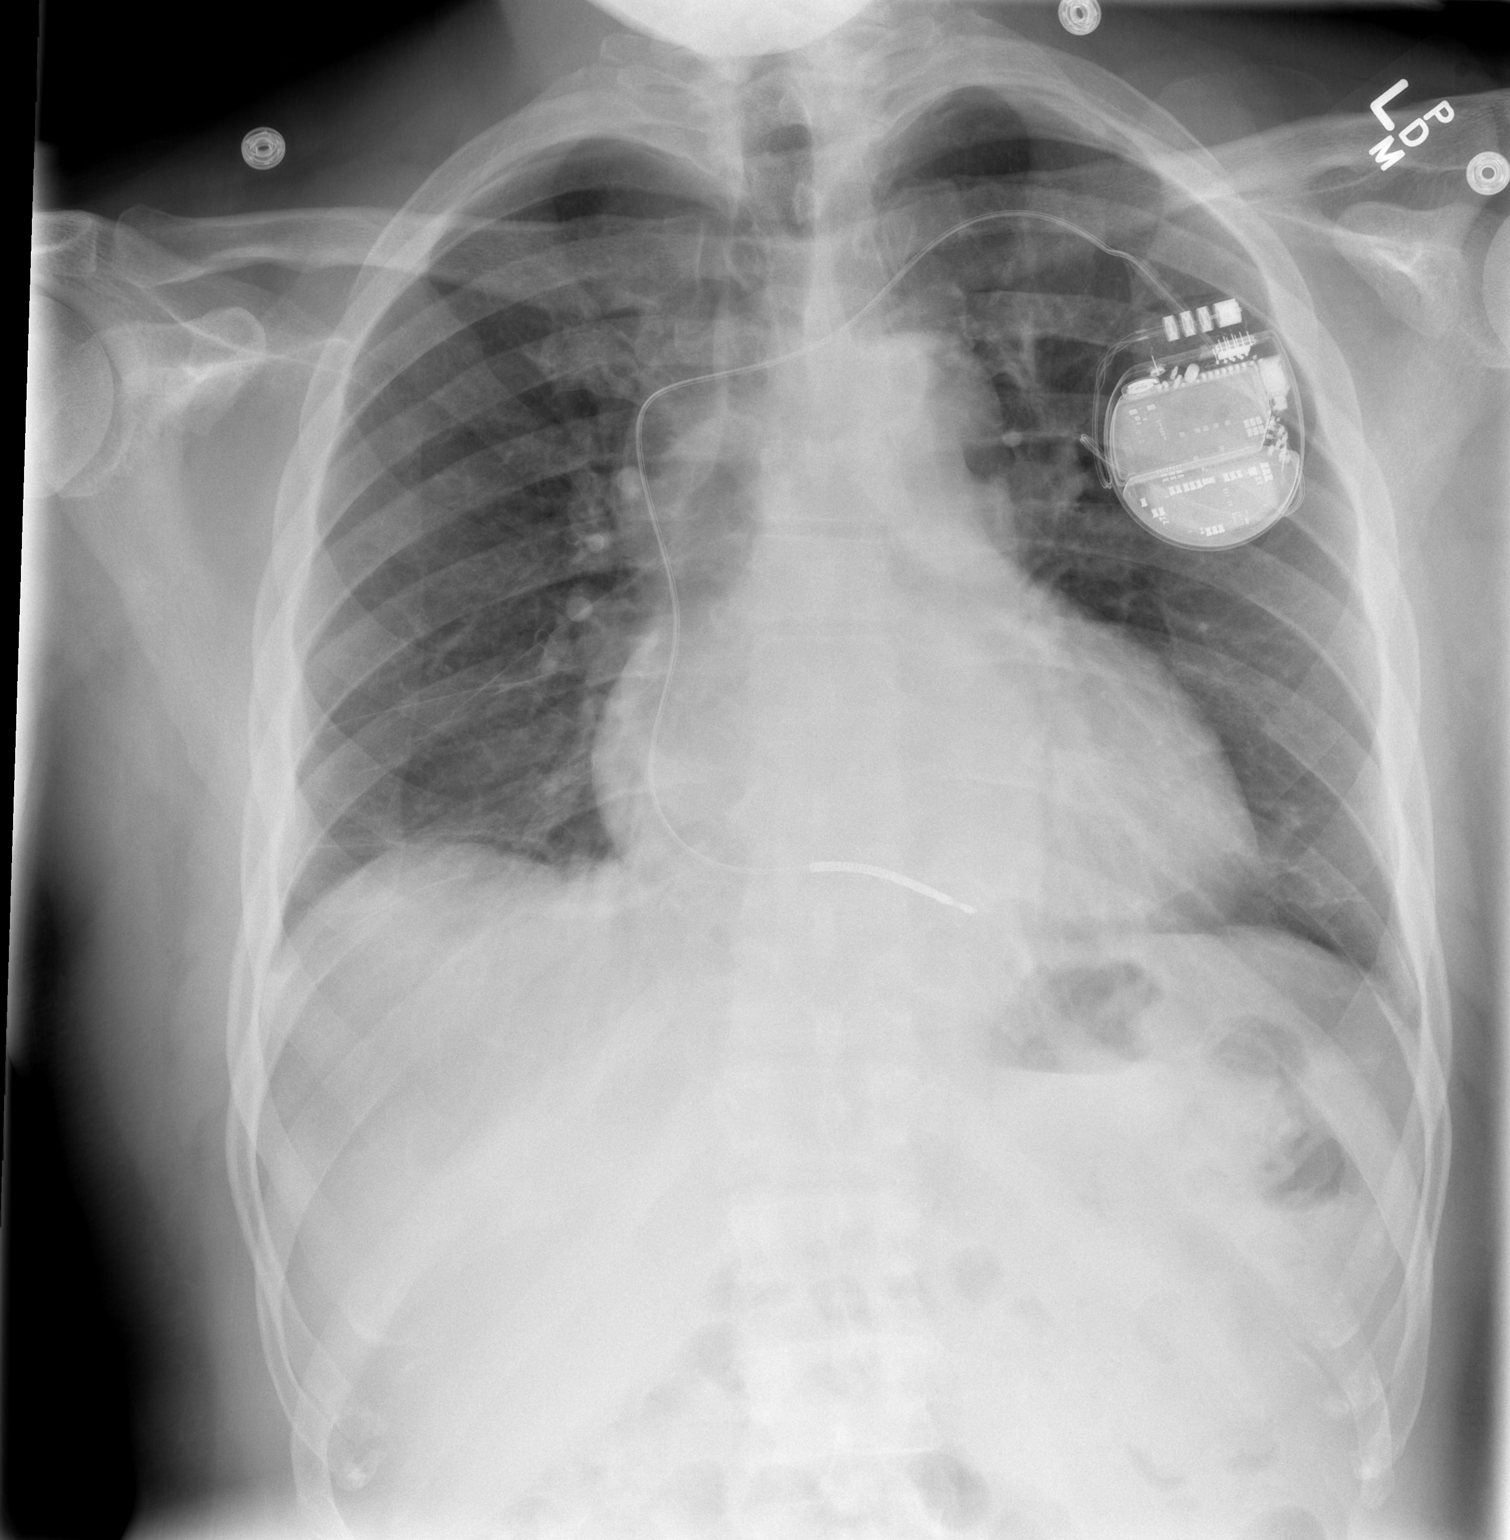

[w chest lat *]
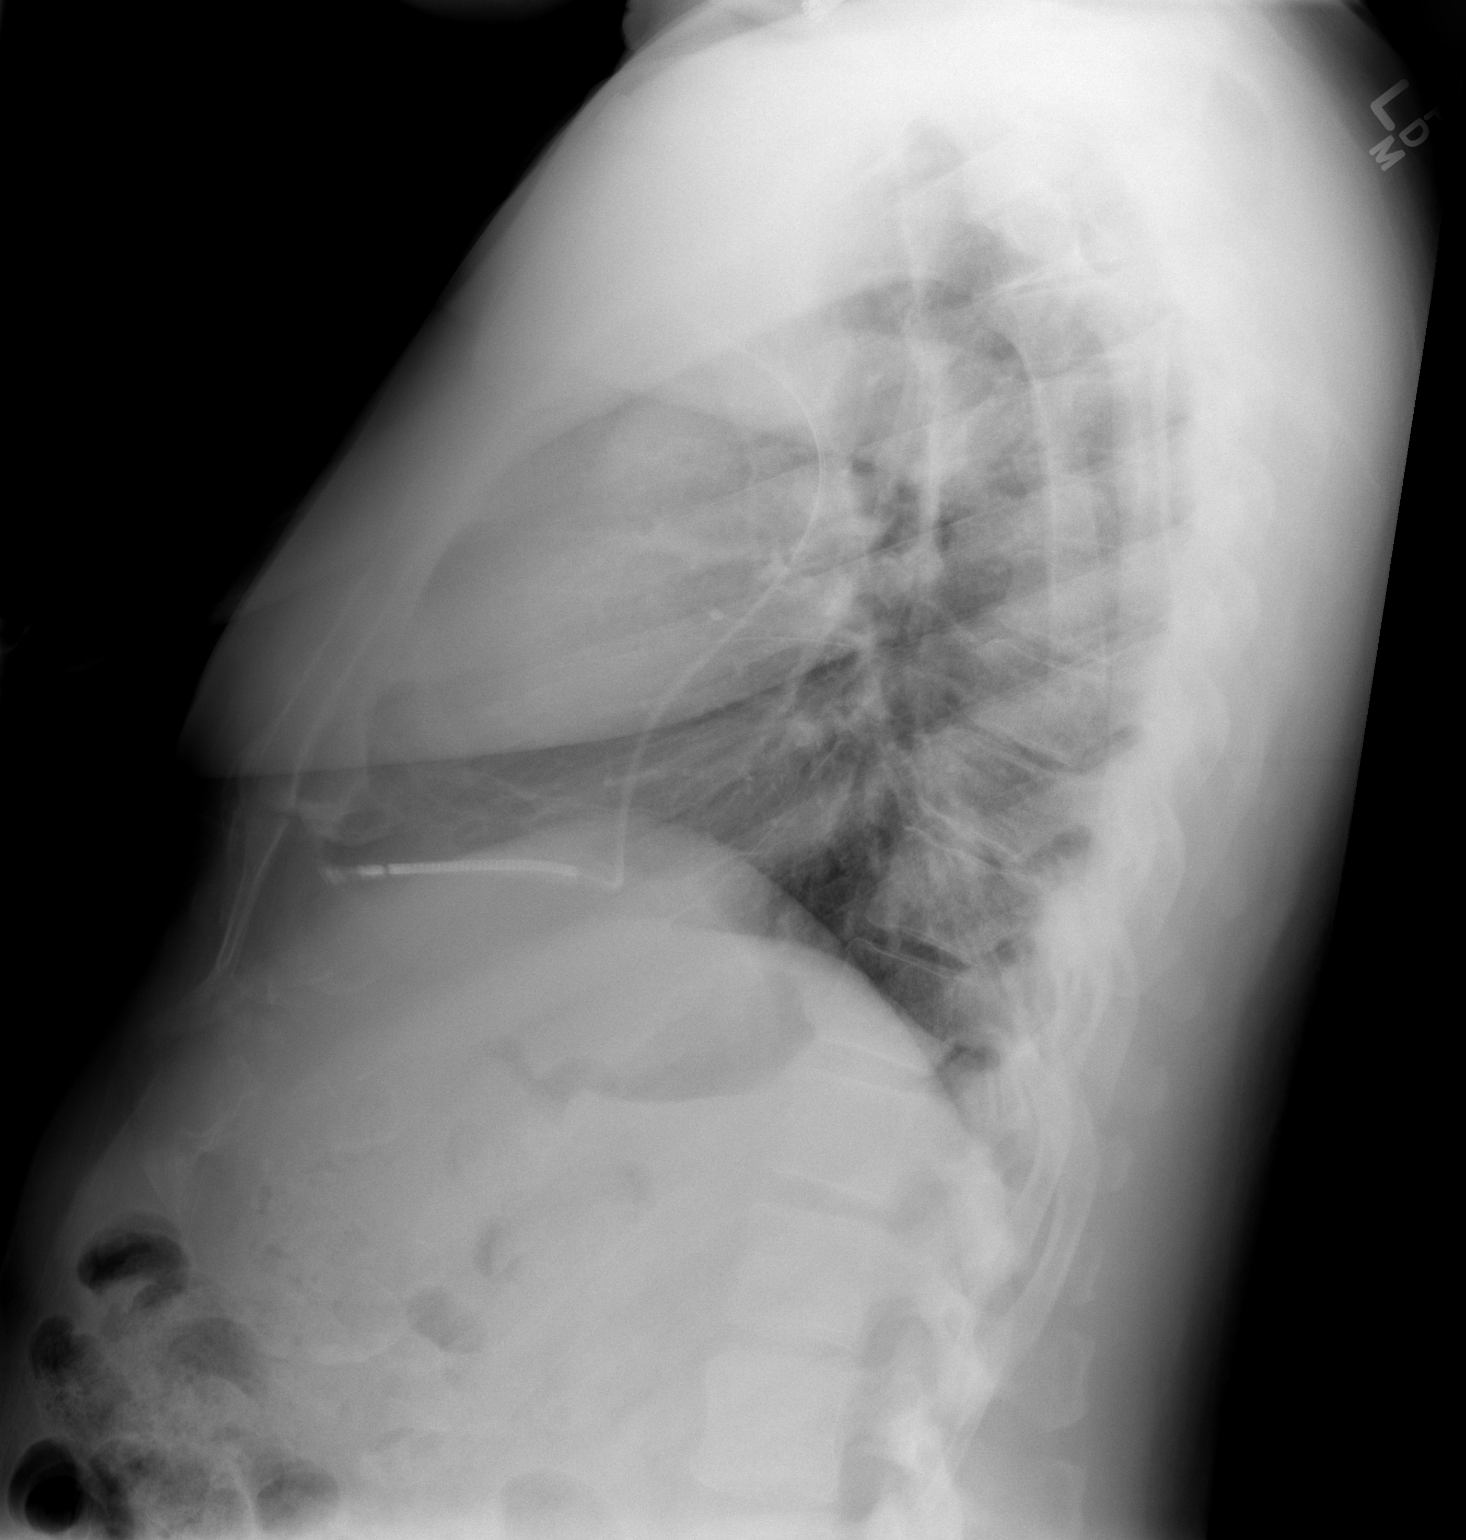

[2 of 2 positions shown; findings below may reference images not displayed]

FINDINGS: There is a new left sided pacemaker/AICD with lead tip
projecting in the expected location of the right ventricular apex.
No definite pneumothorax.  No acute consolidative air space
disease.  No pleural effusions.  Linear opacities in the lung bases
bilaterally are most compatible with subsegmental atelectasis.
Mild cardiomegaly is unchanged.  Pulmonary vasculature is normal.
The patient is rotated to the right on today's exam, resulting in
distortion of the mediastinal contours and reduced diagnostic
sensitivity and specificity for mediastinal pathology.
IMPRESSION: 1.  New left-sided pacemaker / AICD appears appropriately
positioned, and there are no immediate complicating features.
2.  Mild bibasilar subsegmental atelectasis.
3.  Mild cardiomegaly.

## 2014-06-21 ENCOUNTER — Other Ambulatory Visit: Payer: Self-pay | Admitting: Family Medicine

## 2014-06-21 DIAGNOSIS — R109 Unspecified abdominal pain: Secondary | ICD-10-CM

## 2014-06-21 DIAGNOSIS — R197 Diarrhea, unspecified: Secondary | ICD-10-CM

## 2014-06-26 ENCOUNTER — Ambulatory Visit: Payer: Commercial Managed Care - POS

## 2014-06-28 ENCOUNTER — Ambulatory Visit
Admission: RE | Admit: 2014-06-28 | Discharge: 2014-06-28 | Disposition: A | Discharge status: Home / Self Care | Payer: Commercial Managed Care - POS | Source: Ambulatory Visit | Attending: Family Medicine | Admitting: Family Medicine

## 2014-06-28 DIAGNOSIS — R109 Unspecified abdominal pain: Secondary | ICD-10-CM | POA: Insufficient documentation

## 2014-06-28 DIAGNOSIS — IMO0002 Reserved for concepts with insufficient information to code with codable children: Secondary | ICD-10-CM | POA: Insufficient documentation

## 2014-06-28 DIAGNOSIS — N281 Cyst of kidney, acquired: Secondary | ICD-10-CM | POA: Insufficient documentation

## 2014-06-28 DIAGNOSIS — R197 Diarrhea, unspecified: Secondary | ICD-10-CM | POA: Insufficient documentation

## 2014-06-28 DIAGNOSIS — K7689 Other specified diseases of liver: Secondary | ICD-10-CM | POA: Insufficient documentation

## 2014-06-28 DIAGNOSIS — K8020 Calculus of gallbladder without cholecystitis without obstruction: Secondary | ICD-10-CM | POA: Insufficient documentation

## 2014-06-28 MED ORDER — IODIXANOL 320 MG/ML IV SOLN
100.0000 mL | Freq: Once | INTRAVENOUS | Status: AC | PRN
Start: 2014-06-28 — End: 2014-06-28
  Administered 2014-06-28: 100 mL via INTRAVENOUS

## 2014-08-12 ENCOUNTER — Inpatient Hospital Stay: Payer: Commercial Managed Care - POS | Admitting: Family Medicine

## 2014-08-12 ENCOUNTER — Observation Stay: Payer: Medicare Other

## 2014-08-12 ENCOUNTER — Emergency Department: Payer: Medicare Other

## 2014-08-12 ENCOUNTER — Inpatient Hospital Stay
Admission: EM | Admit: 2014-08-12 | Discharge: 2014-08-16 | DRG: 292 | Disposition: A | Discharge status: Home Health | Payer: Medicare Other | Attending: Internal Medicine | Admitting: Internal Medicine

## 2014-08-12 ENCOUNTER — Other Ambulatory Visit: Payer: Commercial Managed Care - POS

## 2014-08-12 DIAGNOSIS — I2510 Atherosclerotic heart disease of native coronary artery without angina pectoris: Secondary | ICD-10-CM | POA: Diagnosis present

## 2014-08-12 DIAGNOSIS — K279 Peptic ulcer, site unspecified, unspecified as acute or chronic, without hemorrhage or perforation: Secondary | ICD-10-CM

## 2014-08-12 DIAGNOSIS — K761 Chronic passive congestion of liver: Secondary | ICD-10-CM | POA: Diagnosis present

## 2014-08-12 DIAGNOSIS — R197 Diarrhea, unspecified: Secondary | ICD-10-CM | POA: Diagnosis present

## 2014-08-12 DIAGNOSIS — Z95810 Presence of automatic (implantable) cardiac defibrillator: Secondary | ICD-10-CM

## 2014-08-12 DIAGNOSIS — I081 Rheumatic disorders of both mitral and tricuspid valves: Secondary | ICD-10-CM | POA: Diagnosis present

## 2014-08-12 DIAGNOSIS — R0902 Hypoxemia: Secondary | ICD-10-CM | POA: Diagnosis present

## 2014-08-12 DIAGNOSIS — I10 Essential (primary) hypertension: Secondary | ICD-10-CM | POA: Diagnosis present

## 2014-08-12 DIAGNOSIS — K208 Other esophagitis: Secondary | ICD-10-CM | POA: Diagnosis present

## 2014-08-12 DIAGNOSIS — Z8249 Family history of ischemic heart disease and other diseases of the circulatory system: Secondary | ICD-10-CM

## 2014-08-12 DIAGNOSIS — E119 Type 2 diabetes mellitus without complications: Secondary | ICD-10-CM

## 2014-08-12 DIAGNOSIS — R1033 Periumbilical pain: Secondary | ICD-10-CM | POA: Diagnosis present

## 2014-08-12 DIAGNOSIS — Z7982 Long term (current) use of aspirin: Secondary | ICD-10-CM

## 2014-08-12 DIAGNOSIS — E785 Hyperlipidemia, unspecified: Secondary | ICD-10-CM | POA: Diagnosis present

## 2014-08-12 DIAGNOSIS — I428 Other cardiomyopathies: Secondary | ICD-10-CM | POA: Diagnosis present

## 2014-08-12 DIAGNOSIS — K8020 Calculus of gallbladder without cholecystitis without obstruction: Secondary | ICD-10-CM | POA: Diagnosis present

## 2014-08-12 DIAGNOSIS — I5021 Acute systolic (congestive) heart failure: Principal | ICD-10-CM | POA: Diagnosis present

## 2014-08-12 DIAGNOSIS — I472 Ventricular tachycardia: Secondary | ICD-10-CM | POA: Diagnosis not present

## 2014-08-12 DIAGNOSIS — I252 Old myocardial infarction: Secondary | ICD-10-CM

## 2014-08-12 DIAGNOSIS — I509 Heart failure, unspecified: Secondary | ICD-10-CM

## 2014-08-12 DIAGNOSIS — R071 Chest pain on breathing: Secondary | ICD-10-CM | POA: Diagnosis present

## 2014-08-12 DIAGNOSIS — Z794 Long term (current) use of insulin: Secondary | ICD-10-CM

## 2014-08-12 DIAGNOSIS — R7989 Other specified abnormal findings of blood chemistry: Secondary | ICD-10-CM | POA: Diagnosis present

## 2014-08-12 DIAGNOSIS — R1084 Generalized abdominal pain: Secondary | ICD-10-CM

## 2014-08-12 DIAGNOSIS — Z833 Family history of diabetes mellitus: Secondary | ICD-10-CM

## 2014-08-12 DIAGNOSIS — E1165 Type 2 diabetes mellitus with hyperglycemia: Secondary | ICD-10-CM | POA: Diagnosis present

## 2014-08-12 DIAGNOSIS — Z8711 Personal history of peptic ulcer disease: Secondary | ICD-10-CM

## 2014-08-12 DIAGNOSIS — K828 Other specified diseases of gallbladder: Secondary | ICD-10-CM | POA: Diagnosis present

## 2014-08-12 DIAGNOSIS — R0600 Dyspnea, unspecified: Secondary | ICD-10-CM | POA: Diagnosis present

## 2014-08-12 DIAGNOSIS — I420 Dilated cardiomyopathy: Secondary | ICD-10-CM | POA: Diagnosis present

## 2014-08-12 DIAGNOSIS — R1010 Upper abdominal pain, unspecified: Secondary | ICD-10-CM | POA: Diagnosis present

## 2014-08-12 HISTORY — DX: Heart failure, unspecified: I50.9

## 2014-08-12 LAB — COMPREHENSIVE METABOLIC PANEL
ALT: 75 U/L — ABNORMAL HIGH (ref 0–55)
AST (SGOT): 44 U/L — ABNORMAL HIGH (ref 5–34)
Albumin/Globulin Ratio: 1.4 (ref 0.9–2.2)
Albumin: 3.7 g/dL (ref 3.5–5.0)
Alkaline Phosphatase: 64 U/L (ref 38–106)
Anion Gap: 11 (ref 5.0–15.0)
BUN: 15.2 mg/dL (ref 9.0–28.0)
Bilirubin, Total: 1.8 mg/dL — ABNORMAL HIGH (ref 0.2–1.2)
CO2: 22 meq/L (ref 22–29)
Calcium: 9 mg/dL (ref 8.5–10.5)
Chloride: 108 meq/L (ref 100–111)
Creatinine: 0.9 mg/dL (ref 0.7–1.3)
Globulin: 2.7 g/dL (ref 2.0–3.6)
Glucose: 142 mg/dL — ABNORMAL HIGH (ref 70–100)
Potassium: 5 meq/L (ref 3.5–5.1)
Protein, Total: 6.4 g/dL (ref 6.0–8.3)
Sodium: 141 meq/L (ref 136–145)

## 2014-08-12 LAB — PT AND APTT
PT INR: 1.1
PT: 14.4 s (ref 12.6–15.0)
PTT: 30 s (ref 23–37)

## 2014-08-12 LAB — CBC AND DIFFERENTIAL
Basophils Absolute Automated: 0.01 10*3/uL (ref 0.00–0.20)
Basophils Automated: 0 %
Eosinophils Absolute Automated: 0.2 10*3/uL (ref 0.00–0.70)
Eosinophils Automated: 2 %
Hematocrit: 46.8 % (ref 42.0–52.0)
Hgb: 15.5 g/dL (ref 13.0–17.0)
Immature Granulocytes Absolute: 0.03 10*3/uL
Immature Granulocytes: 0 %
Lymphocytes Absolute Automated: 2.92 10*3/uL (ref 0.50–4.40)
Lymphocytes Automated: 35 %
MCH: 30.9 pg (ref 28.0–32.0)
MCHC: 33.1 g/dL (ref 32.0–36.0)
MCV: 93.2 fL (ref 80.0–100.0)
MPV: 12.2 fL (ref 9.4–12.3)
Monocytes Absolute Automated: 0.55 10*3/uL (ref 0.00–1.20)
Monocytes: 7 %
Neutrophils Absolute: 4.59 10*3/uL (ref 1.80–8.10)
Neutrophils: 56 %
Platelets: 143 10*3/uL (ref 140–400)
RBC: 5.02 10*6/uL (ref 4.70–6.00)
RDW: 14 % (ref 12–15)
WBC: 8.27 10*3/uL (ref 3.50–10.80)

## 2014-08-12 LAB — GFR: EGFR: >60

## 2014-08-12 LAB — HEMOGLOBIN A1C: Hemoglobin A1C: 7.5 % — ABNORMAL HIGH (ref 0.0–6.0)

## 2014-08-12 LAB — URINALYSIS
Glucose, UA: NEGATIVE
Ketones UA: 15 — AB
Leukocyte Esterase, UA: NEGATIVE
Nitrite, UA: NEGATIVE
Protein, UR: 100 — AB
Specific Gravity UA: 1.033 (ref 1.001–1.035)
Urine pH: 6 (ref 5.0–8.0)
Urobilinogen, UA: 0.2 mg/dL (ref 0.2–2.0)

## 2014-08-12 LAB — HEPATIC FUNCTION PANEL
ALT: 74 U/L — ABNORMAL HIGH (ref 0–55)
AST (SGOT): 41 U/L — ABNORMAL HIGH (ref 5–34)
Albumin/Globulin Ratio: 1.5 (ref 0.9–2.2)
Albumin: 3.7 g/dL (ref 3.5–5.0)
Alkaline Phosphatase: 64 U/L (ref 38–106)
Bilirubin Direct: 0.6 mg/dL — ABNORMAL HIGH (ref 0.0–0.5)
Bilirubin Indirect: 1.3 mg/dL — ABNORMAL HIGH (ref 0.0–1.1)
Bilirubin, Total: 1.9 mg/dL — ABNORMAL HIGH (ref 0.2–1.2)
Globulin: 2.5 g/dL (ref 2.0–3.6)
Protein, Total: 6.2 g/dL (ref 6.0–8.3)

## 2014-08-12 LAB — MAGNESIUM: Magnesium: 1.8 mg/dL (ref 1.6–2.6)

## 2014-08-12 LAB — GLUCOSE WHOLE BLOOD - POCT
Whole Blood Glucose POCT: 109 mg/dL — ABNORMAL HIGH (ref 70–100)
Whole Blood Glucose POCT: 166 mg/dL — ABNORMAL HIGH (ref 70–100)
Whole Blood Glucose POCT: 213 mg/dL — ABNORMAL HIGH (ref 70–100)

## 2014-08-12 LAB — ECG 12-LEAD
Atrial Rate: 83 {beats}/min
P Axis: 72 degrees
P-R Interval: 148 ms
Q-T Interval: 396 ms
QRS Duration: 88 ms
QTC Calculation (Bezet): 465 ms
R Axis: 17 degrees
T Axis: 51 degrees
Ventricular Rate: 83 {beats}/min

## 2014-08-12 LAB — TSH: TSH: 0.74 u[IU]/mL (ref 0.35–4.94)

## 2014-08-12 LAB — B-TYPE NATRIURETIC PEPTIDE: B-Natriuretic Peptide: 1229.5 pg/mL — ABNORMAL HIGH (ref 0.0–100.0)

## 2014-08-12 LAB — LIPASE: Lipase: 21 U/L (ref 8–78)

## 2014-08-12 LAB — TROPONIN I
Troponin I: 0.04 ng/mL (ref 0.00–0.09)
Troponin I: 0.04 ng/mL (ref 0.00–0.09)
Troponin I: 0.04 ng/mL (ref 0.00–0.09)

## 2014-08-12 LAB — URINE MICROSCOPIC

## 2014-08-12 LAB — IHS D-DIMER: D-Dimer: 0.61 ug/mL FEU — ABNORMAL HIGH (ref 0.00–0.51)

## 2014-08-12 LAB — LACTIC ACID, PLASMA
Lactic Acid: 1.2 mmol/L (ref 0.5–2.2)
Lactic Acid: 1 mmol/L (ref 0.5–2.2)

## 2014-08-12 MED ORDER — FUROSEMIDE 10 MG/ML IJ SOLN
40.0000 mg | Freq: Once | INTRAMUSCULAR | Status: AC
Start: 2014-08-12 — End: 2014-08-12
  Administered 2014-08-12: 40 mg via INTRAVENOUS
  Filled 2014-08-12: qty 4

## 2014-08-12 MED ORDER — SODIUM CHLORIDE 0.9 % IV BOLUS
1000.0000 mL | Freq: Once | INTRAVENOUS | Status: AC
Start: 2014-08-12 — End: 2014-08-12
  Administered 2014-08-12: 1000 mL via INTRAVENOUS

## 2014-08-12 MED ORDER — ENOXAPARIN SODIUM 100 MG/ML SC SOLN
1.0000 mg/kg | Freq: Once | SUBCUTANEOUS | Status: AC
Start: 2014-08-12 — End: 2014-08-12
  Administered 2014-08-12: 100 mg via SUBCUTANEOUS
  Filled 2014-08-12: qty 1

## 2014-08-12 MED ORDER — AMLODIPINE BESYLATE 5 MG PO TABS
10.0000 mg | ORAL_TABLET | Freq: Every day | ORAL | Status: DC
Start: 2014-08-12 — End: 2014-08-16
  Administered 2014-08-12 – 2014-08-14 (×3): 10 mg via ORAL
  Filled 2014-08-12 (×5): qty 2

## 2014-08-12 MED ORDER — IOHEXOL 240 MG/ML IJ SOLN
50.0000 mL | Freq: Once | INTRAMUSCULAR | Status: AC
Start: 2014-08-12 — End: 2014-08-12
  Administered 2014-08-12: 50 mL via ORAL

## 2014-08-12 MED ORDER — NITROGLYCERIN 2 % TD OINT
0.5000 [in_us] | TOPICAL_OINTMENT | TRANSDERMAL | Status: DC
Start: 2014-08-12 — End: 2014-08-13
  Administered 2014-08-12 – 2014-08-13 (×2): 0.5 [in_us] via TOPICAL
  Filled 2014-08-12 (×2): qty 1

## 2014-08-12 MED ORDER — MORPHINE SULFATE 2 MG/ML IJ/IV SOLN (WRAP)
2.0000 mg | Status: DC | PRN
Start: 2014-08-12 — End: 2014-08-16

## 2014-08-12 MED ORDER — NITROGLYCERIN 2 % TD OINT
1.0000 [in_us] | TOPICAL_OINTMENT | Freq: Once | TRANSDERMAL | Status: AC
Start: 2014-08-12 — End: 2014-08-12
  Administered 2014-08-12: 1 [in_us] via TOPICAL
  Filled 2014-08-12: qty 1

## 2014-08-12 MED ORDER — GLUCAGON 1 MG IJ SOLR (WRAP)
1.0000 mg | INTRAMUSCULAR | Status: DC | PRN
Start: 2014-08-12 — End: 2014-08-12

## 2014-08-12 MED ORDER — ONDANSETRON HCL 4 MG/2ML IJ SOLN
4.0000 mg | Freq: Four times a day (QID) | INTRAMUSCULAR | Status: DC | PRN
Start: 2014-08-12 — End: 2014-08-16

## 2014-08-12 MED ORDER — ACETAMINOPHEN 325 MG PO TABS
650.0000 mg | ORAL_TABLET | ORAL | Status: DC | PRN
Start: 2014-08-12 — End: 2014-08-16
  Administered 2014-08-14 – 2014-08-15 (×4): 650 mg via ORAL

## 2014-08-12 MED ORDER — INSULIN ASPART 100 UNIT/ML SC SOLN
1.0000 [IU] | SUBCUTANEOUS | Status: DC | PRN
Start: 2014-08-12 — End: 2014-08-16
  Administered 2014-08-12: 1 [IU] via SUBCUTANEOUS
  Administered 2014-08-12: 2 [IU] via SUBCUTANEOUS
  Administered 2014-08-13 – 2014-08-14 (×5): 1 [IU] via SUBCUTANEOUS
  Administered 2014-08-14: 2 [IU] via SUBCUTANEOUS
  Administered 2014-08-16 (×2): 1 [IU] via SUBCUTANEOUS
  Filled 2014-08-12 (×5): qty 1
  Filled 2014-08-12: qty 2
  Filled 2014-08-12: qty 1
  Filled 2014-08-12: qty 2
  Filled 2014-08-12 (×2): qty 1

## 2014-08-12 MED ORDER — ALBUTEROL-IPRATROPIUM 2.5-0.5 (3) MG/3ML IN SOLN
3.0000 mL | RESPIRATORY_TRACT | Status: DC | PRN
Start: 2014-08-12 — End: 2014-08-16
  Administered 2014-08-12 – 2014-08-14 (×2): 3 mL via RESPIRATORY_TRACT
  Filled 2014-08-12 (×2): qty 3

## 2014-08-12 MED ORDER — DEXTROSE 50 % IV SOLN
25.0000 mL | INTRAVENOUS | Status: DC | PRN
Start: 2014-08-12 — End: 2014-08-16

## 2014-08-12 MED ORDER — CARVEDILOL 3.125 MG PO TABS
6.2500 mg | ORAL_TABLET | Freq: Two times a day (BID) | ORAL | Status: DC
Start: 2014-08-12 — End: 2014-08-12

## 2014-08-12 MED ORDER — ONDANSETRON HCL 4 MG/2ML IJ SOLN
4.0000 mg | Freq: Once | INTRAMUSCULAR | Status: DC | PRN
Start: 2014-08-12 — End: 2014-08-16

## 2014-08-12 MED ORDER — FUROSEMIDE 10 MG/ML IJ SOLN
40.0000 mg | Freq: Two times a day (BID) | INTRAMUSCULAR | Status: AC
Start: 2014-08-12 — End: 2014-08-13
  Administered 2014-08-12 – 2014-08-13 (×3): 40 mg via INTRAVENOUS
  Filled 2014-08-12 (×3): qty 4

## 2014-08-12 MED ORDER — GLUCAGON 1 MG IJ SOLR (WRAP)
1.0000 mg | INTRAMUSCULAR | Status: DC | PRN
Start: 2014-08-12 — End: 2014-08-16

## 2014-08-12 MED ORDER — INSULIN GLARGINE 100 UNIT/ML SC SOLN
20.0000 [IU] | Freq: Every evening | SUBCUTANEOUS | Status: DC
Start: 2014-08-12 — End: 2014-08-13
  Administered 2014-08-12: 20 [IU] via SUBCUTANEOUS
  Filled 2014-08-12: qty 20

## 2014-08-12 MED ORDER — PANTOPRAZOLE SODIUM 40 MG IV SOLR
40.0000 mg | Freq: Every day | INTRAVENOUS | Status: DC
Start: 2014-08-12 — End: 2014-08-13
  Administered 2014-08-12 – 2014-08-13 (×2): 40 mg via INTRAVENOUS
  Filled 2014-08-12 (×2): qty 40

## 2014-08-12 MED ORDER — VALSARTAN 160 MG PO TABS
160.0000 mg | ORAL_TABLET | Freq: Every day | ORAL | Status: DC
Start: 2014-08-12 — End: 2014-08-16
  Administered 2014-08-12 – 2014-08-14 (×3): 160 mg via ORAL
  Filled 2014-08-12 (×5): qty 1

## 2014-08-12 MED ORDER — ASPIRIN 81 MG PO CHEW
81.0000 mg | CHEWABLE_TABLET | Freq: Every day | ORAL | Status: DC
Start: 2014-08-12 — End: 2014-08-16
  Administered 2014-08-12 – 2014-08-16 (×5): 81 mg via ORAL
  Filled 2014-08-12 (×5): qty 1

## 2014-08-12 MED ORDER — CARVEDILOL 12.5 MG PO TABS
12.5000 mg | ORAL_TABLET | Freq: Two times a day (BID) | ORAL | Status: DC
Start: 2014-08-12 — End: 2014-08-13
  Administered 2014-08-12 – 2014-08-13 (×2): 12.5 mg via ORAL
  Filled 2014-08-12 (×2): qty 1

## 2014-08-12 MED ORDER — ACETAMINOPHEN 325 MG PO TABS
650.0000 mg | ORAL_TABLET | ORAL | Status: DC | PRN
Start: 2014-08-12 — End: 2014-08-15
  Administered 2014-08-12: 650 mg via ORAL
  Filled 2014-08-12 (×5): qty 2

## 2014-08-12 MED ORDER — DEXTROSE 50 % IV SOLN
25.0000 mL | INTRAVENOUS | Status: DC | PRN
Start: 2014-08-12 — End: 2014-08-12

## 2014-08-12 NOTE — Consults (Addendum)
[place] [facility] CARDIOLOGY CONSULTATION REPORT  [facility]    Date Time: 08/12/2014 1:46 PM  Patient Name: [name] [name]  Requesting Physician: Anselmo Pickler, MD           History:   CAIDE CAMPI is a 68 y.o. male admitted on 08/12/2014, for whom we are asked to provide cardiac consultation, regarding chf. Patient presents with symptoms of shortness of breath for 2 days. He denies edema but has complained of abdominal bloating and diarrhea for 3 days. He also describes chest pressure like it is difficult taking a deep breath. He is no longer on exforge. He states his bps are usually around 130s. He does not complain of fevers, chills, palpitations, or icd shocks.     Past Medical History:     Past Medical History   Diagnosis Date   . Heart attack 2011     and 2014   . Diabetes mellitus    . Hypertension    . Cardiac defibrillator in situ    . CAD (coronary artery disease)    . Ischemic cardiomyopathy      20%   . CHF (congestive heart failure) 08/12/2014       Past Surgical History:     Past Surgical History   Procedure Laterality Date   . Bring back open heart     . Hernia repair       LIH   . Duodenal ulcer  1973   . Orthopedic surgery       R foot corrective   . Tonsillectomy and adenoidectomy  1954       Family History:     Family History   Problem Relation Age of Onset   . Breast cancer Mother    . Heart attack Mother 8   . Diabetes Brother        Social History:     History     Social History   . Marital Status: Married     Spouse Name: Lajoyce Corners     Number of Children: 0   . Years of Education: N/A     Occupational History   . 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 Smoking status: Never Smoker    . Smokeless tobacco: Never Used   . Alcohol Use: No   . Drug Use: No   . Sexual Activity: No     Other Topics Concern   . Not on file     Social History Narrative       Allergies:   No Known Allergies    Medications:     No current facility-administered medications on file  prior to encounter.     Current Outpatient Prescriptions on File Prior to Encounter   Medication Sig Dispense Refill   . amlodipine-valsartan (EXFORGE) 10-160 MG per tablet Take 1 tablet by mouth daily.     Marland Kitchen aspirin 81 MG chewable tablet Chew 1 tablet (81 mg total) by mouth daily.  0   . carvedilol (COREG) 6.25 MG tablet Take 6.25 mg by mouth 2 (two) times daily with meals.     . insulin glargine (LANTUS) 100 UNIT/ML injection Inject 45 Units into the skin nightly.     . metFORMIN (GLUCOPHAGE) 500 MG tablet Take 500 mg by mouth 2 (two) times daily with meals.           Review of Systems:    Comprehensive review of systems including  constitutional, eyes, ears, nose, mouth, throat, cardiovascular, GI, GU, musculoskeletal, integumentary, respiratory, neurologic, psychiatric, and endocrine is negative other than what is mentioned already in the history of present illness    Physical Exam:     Filed Vitals:    08/12/14 1314   BP: 138/88   Pulse: 82   Temp: 97.5 F (36.4 C)   Resp: 18   SpO2: 96%     Temp (24hrs), Avg:97.4 F (36.3 C), Min:97.3 F (36.3 C), Max:97.5 F (36.4 C)      GENERAL: Patient is in no acute distress   HEENT: No conjunctival lesions, no pallor of the oral mucosa  NECK:+ jugular venous distention, normal carotid upstrokes without bruits   CARDIAC: Normal apical impulse, regular rate and rhythm, with normal S1 and S2, no murmurs, rubs, or gallops   CHEST: Clear to auscultation bilaterally, normal respiratory effort  ABDOMEN: No abdominal bruits, distended, nontender, no increased abdominal aorta  EXTREMITIES: No clubbing, cyanosis, 1+ bilateral pretibial edema, 2+ femoral pulses bilaterally without bruits  SKIN: No lesions  NEUROLOGIC: Alert and oriented to time, place and person, normal mood and affect  MUSCULOSKELETAL: Normal muscle strength and tone.      Labs Reviewed:       Recent Labs  Lab 08/12/14  1004   TROPONIN I 0.04       Recent Labs  Lab 08/12/14  1150   ALT 74*   AST (SGOT) 41*        Recent Labs  Lab 08/12/14  1150   MAGNESIUM 1.8       Recent Labs  Lab 08/12/14  1150   PT 14.4   PT INR 1.1   PTT 30       Recent Labs  Lab 08/12/14  0843   WBC 8.27   HEMOGLOBIN 15.5   HEMATOCRIT 46.8   PLATELETS 143       Recent Labs  Lab 08/12/14  0843   SODIUM 141   POTASSIUM 5.0   CHLORIDE 108   CO2 22   BUN 15.2   CREATININE 0.9   EGFR >60.0   GLUCOSE 142*     Lab Results   Component Value Date    BNP 1229.5* 08/12/2014     Estimated Creatinine Clearance: 102.7 mL/min (based on Cr of 0.9).  EKG: normal sinus rhythm, lae, nonspecific t wave changes.  Radiology      Radiology Results (24 Hour)     Procedure Component Value Units Date/Time    US Venous Low Extrem Duplx Dopp Comp Bilat [[id number]] Collected:  08/12/14 1313    Order Status:  Completed Updated:  08/12/14 1318    Narrative:      INDICATION: Dyspnea. Elevated d-dimer.    PROCEDURE: Ultrasound of the deep venous systems of both lower  extremities.    FINDINGS: The deep venous systems of the lower extremities were imaged  from the groins to the popliteal fossae. The veins were compressible at  all levels. Normal waveforms were seen with normal respiratory variation  of blood flow and normal augmentation of blood flow with calf  compression. Limited evaluation of the calf veins was also performed.   Visualized calf veins were completely compressible.      Impression:       No evidence of lower extremity venous thrombosis.    Wynema Birch, MD   08/12/2014 1:14 PM      CT Abd/Pelvis with PO Contrast [[id number]] Collected:  08/12/14 1241  Order Status:  Completed Updated:  08/12/14 1251    Narrative:      Indication: Abdominal pain.    Procedure: Unenhanced CT of the abdomen and pelvis. Oral contrast  material is present. No IV contrast material.    Comparison: Previous CT September 9.    Findings: Small bilateral pleural effusions. Mild cardiomegaly. No  pericardial effusion. Small foci of atelectasis are seen in the  posterior lung bases. No  ascites. Mild edema in the subcutaneous fat of  the back and in the intra-abdominal fat. Several large exophytic renal  cysts, unchanged from previous scan. No hydronephrosis. No urinary tract  calculi. Sensitivity for some abnormalities is limited without IV  contrast material. No focal liver lesions are visible. Unremarkable  gallbladder. No dilatation of the biliary tree. Unremarkable spleen,  pancreas, adrenals, and abdominal aorta. No dilated bowel loops or bowel  wall thickening. No free intraperitoneal gas. Normal appendix. No  definite colonic diverticula. Degenerative changes of the spine are  seen.      Impression:      Impression:  1. Bilateral pleural effusions.  2. Bilateral renal cysts.  3. No acute abnormal findings are seen in the abdomen.    Wynema Birch, MD   08/12/2014 12:47 PM      Chest 2 Views [[id number]] Collected:  08/12/14 0906    Order Status:  Completed Updated:  08/12/14 0912    Narrative:      Indication: Shortness of breath.    Procedure: PA and lateral views of the chest.    Comparison: Previous exam March 13.    Findings: There is a left-sided defibrillator generator with lead  extending into the right atrium, unchanged in position compared with  previous exam. There is mild enlargement of the heart shadow, slightly  increased from previous exam. Trace by lateral pleural effusions. Mild  evidence of interstitial edema including thickening of the fissures and  indistinctness of the pulmonary vasculature.      Impression:      Impression:  1. Mildly increased size of the heart shadow consistent with  cardiomegaly or pericardial effusion.  2. New pulmonary edema and small bilateral pleural effusions.    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

## 2014-08-12 NOTE — ED Provider Notes (Signed)
Physician/Midlevel provider first contact with patient: 08/12/14 0719         History     Chief Complaint   Patient presents with   . Abdominal Pain   . Diarrhea     Patient presents with two month history of abdominal pain.  He states that it onset of signs and symptoms, he was evaluated at urgent care and treated with an unknown antibiotic.  He was evaluated by primary care and had CT scan of abdomen and pelvis on June 28, 2014 which showed no evidence of acute pathology.  Signs and symptoms have been mostly intermittent, but are more persistent over the last several days.  He states that he feels bloated and with abdominal distention.  He also reports diarrhea.  Patient has history of diabetes, coronary artery disease and ischemic cardiomyopathy states that he is also had increasing shortness of breath over the past 2 days.  He denies chest pain, dizziness, fevers, chills, nausea, vomiting, or other signs or symptoms.    Past Medical History   Diagnosis Date   . Heart attack 2011     and 2014   . Diabetes mellitus    . Hypertension    . Cardiac defibrillator in situ    . CAD (coronary artery disease)    . Ischemic cardiomyopathy      20%       Past Surgical History   Procedure Laterality Date   . Bring back open heart     . Hernia repair       LIH   . Duodenal ulcer  1973   . Orthopedic surgery       R foot corrective   . Tonsillectomy and adenoidectomy  1954       Family History   Problem Relation Age of Onset   . Breast cancer Mother    . Heart attack Mother 2   . Diabetes Brother        Social  History   Substance Use Topics   . Smoking status: Never Smoker    . Smokeless tobacco: Never Used   . Alcohol Use: No       .     No Known Allergies    Current/Home Medications    AMLODIPINE-VALSARTAN (EXFORGE) 10-160 MG PER TABLET    Take 1 tablet by mouth daily.    ASPIRIN 81 MG CHEWABLE TABLET    Chew 1 tablet (81 mg total) by mouth daily.    CARVEDILOL (COREG) 6.25 MG TABLET    Take 6.25 mg by mouth 2  (two) times daily with meals.    INSULIN GLARGINE (LANTUS) 100 UNIT/ML INJECTION    Inject 45 Units into the skin nightly.    METFORMIN (GLUCOPHAGE) 500 MG TABLET    Take 500 mg by mouth 2 (two) times daily with meals.    NIACIN PO    Take by mouth.    SITAGLIPTIN-METFORMIN HCL (JANUMET PO)    Take by mouth.        Review of Systems   Constitutional: Negative for fever, chills and fatigue.   Respiratory: Positive for shortness of breath. Negative for cough.    Cardiovascular: Negative for chest pain, palpitations and leg swelling.   Gastrointestinal: Positive for abdominal pain and diarrhea. Negative for nausea and vomiting.   Genitourinary: Negative for dysuria, urgency, hematuria and flank pain.   Musculoskeletal: Negative for myalgias, back pain, arthralgias and neck pain.   Skin: Negative for color change and rash.  Neurological: Negative for dizziness and headaches.   Psychiatric/Behavioral: Negative for confusion and agitation.   All other systems reviewed and are negative.      Physical Exam    BP: (!) 148/102 mmHg, Heart Rate: 93, Temp: 97.3 F (36.3 C), Resp Rate: (!) 24, SpO2: 98 %, Weight: 102.513 kg     Physical Exam   Constitutional: He is oriented to person, place, and time. He appears well-developed and well-nourished. No distress.   Neck: Normal range of motion. Neck supple.   Cardiovascular: Normal rate, regular rhythm and normal heart sounds.    Pulmonary/Chest: Effort normal and breath sounds normal.   Conversational dyspnea   Abdominal: Soft. Bowel sounds are normal. He exhibits distension. He exhibits no pulsatile midline mass and no mass. There is tenderness (central abdominal tenderness). There is no rebound and no guarding.   Musculoskeletal: He exhibits no edema or tenderness.   Neurological: He is alert and oriented to person, place, and time. He exhibits normal muscle tone. Coordination normal.   Skin: Skin is warm and dry. No rash noted.   Psychiatric: He has a normal mood and affect.  His behavior is normal. Thought content normal.   Nursing note and vitals reviewed.      MDM and ED Course     ED Medication Orders     Start     Status Ordering Provider    08/12/14 1053  iohexol (OMNIPAQUE) 240 MG/ML IV/ORAL solution 50 mL   Once     Route: Oral  Ordered Dose: 50 mL     Ordered Natallia Stellmach C    08/12/14 1045  furosemide (LASIX) injection 40 mg   Once     Route: Intravenous  Ordered Dose: 40 mg     Ordered Harish Bram C    08/12/14 1045  nitroglycerin (NITRO-BID) 2 % ointment 1 inch   Once     Route: Topical  Ordered Dose: 1 inch     Ordered Thora Scherman C    08/12/14 0745  sodium chloride 0.9 % bolus 1,000 mL   Once     Route: Intravenous  Ordered Dose: 1,000 mL     Last MAR action:  Discontinued Comfort Iversen C       Patient received approximately 400 mL IV fluids.       MDM  Number of Diagnoses or Management Options  Acute on chronic congestive heart failure, unspecified congestive heart failure type:   Generalized abdominal pain:   Diagnosis management comments: I, Alasia Enge C. Kizzie Bane, MD, have been the primary provider for Bevely Palmer during this Emergency Dept visit.    Oxygen saturation by pulse oximetry is 95%-100%, Normal.  Interventions: Oxygen Administration    EKG Interpretation  EKG interpreted by ED physician    Rate: Normal  Rhythm: sinus rhythm  Axis: Normal  ST Segments: Normal ST segments  T waves: Non-specific T Wave change(s)   Conduction: No blocks  Impression: Non-specific EKG  Erling Conte, MD    10:57 AM   Patient presents with primary complaint of abdominal pain and diarrhea.  The signs and symptoms of been occurring over the past 2 months.  Workup thus far has been negative.  Patient appears short of breath with conversational dyspnea on initial evaluation.  Chest x-ray shows pulmonary edema and BNP is greater than 1200.  This is consistent with acute congestive heart failure.  Patient is treated with Lasix and nitroglycerin and is admitted for further supportive care.  CT scan of the abdomen and pelvis is pending.    Discussed with Dr. Dwaine Gale for admission.               Amount and/or Complexity of Data Reviewed  Clinical lab tests: ordered and reviewed  Tests in the radiology section of CPT: ordered and reviewed           Procedures    Clinical Impression & Disposition     Clinical Impression  Final diagnoses:   Acute on chronic congestive heart failure, unspecified congestive heart failure type   Generalized abdominal pain        ED Disposition     Admit Bed Type: Telemetry [5]  Admitting Physician: Anselmo Pickler [[id number]]  Patient Class: Observation [104]             Current Discharge Medication List                  Erling Conte, MD  08/12/14 2122

## 2014-08-12 NOTE — ED Notes (Signed)
LUQ Abd pain, Diarrhea, since Wed, Pain geetting worse, seen at Dr Tawana Scale,

## 2014-08-12 NOTE — Plan of Care (Signed)
Problem: Safety  Goal: Patient will be free from injury during hospitalization  Outcome: Progressing  Maintain a safe environment. Use appropriate transfer devices. Implement fall risk precautions. Hourly rounding.    Problem: Pain  Goal: Patient's pain/discomfort is manageable  Outcome: Progressing  Assess and reassess for pain. Offer non pharmacological pain management PRN.

## 2014-08-12 NOTE — H&P (Signed)
ADMISSION HISTORY AND PHYSICAL EXAM    Date Time: 08/12/2014 11:58 AM  Patient Name: [name] [name]  Admitting Physician:   Primary Care Physician: Zorita Pang, MD    CC: abdominal pain, diarrhea, shortness of breath    History of Present Illness:   This patient  is a 68 y.o. 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 He presented to the ED today complaints of acute on chronic periumbilical pain, X 2 months, shortness of breath X 2 days. He has had pain, intermittently, had CT abd/pelvis w IV contrast, no acute pathology. He was prescribed antobiotic (unknown) by PCP; then developed diarrhea this week; increasing abdominal distention; pain does not radiate, no associated nausea, vomiting. He has had fair appetite, but food tastes "metallic". He then has had worsening shortness of breath, central chest pressure with taking deep breath, X 2 days. He finally presented to ED today, CXR showing pulmonary vascular congestion, B small pleural effusions; BNP >1100; A d-dimer was drawn, slightly elevated at 0.6. He states he had a a follow up ICD appointment scheduled this week. He has had EGD and colonoscopy at [facility], cannot recall which physician. He denies headache, dizziness, chest pain radiating to arm, neck or jaw, palpitations, diaphoresis, hemoptysis, hematemesis, hematochezia, melena, dysuria, hematuria, joint pain, fever, chills, rash, recent travel, sick contacts, unusual foods.          Past Medical History   Diagnosis Date   . Heart attack 2011     and 2014   . Diabetes mellitus    . Hypertension    . Cardiac defibrillator in situ    . CAD (coronary artery disease)    . Ischemic cardiomyopathy      20%   . CHF (congestive heart failure) 08/12/2014         Past Surgical History   Procedure Laterality Date   . Bring back open heart     . Hernia repair       [facility]   . Duodenal ulcer  1973   . Orthopedic surgery        R foot corrective   . Tonsillectomy and adenoidectomy  1954       Allergies:   No Known Allergies      Prior to Admission medications    Medication Sig Start Date End Date Taking? Authorizing Provider   amlodipine-valsartan (EXFORGE) 10-160 MG per tablet Take 1 tablet by mouth daily.   Yes [provider]   aspirin 81 MG chewable tablet Chew 1 tablet (81 mg total) by mouth daily. 12/30/13  Yes McCoy, Dorice Lamas, NP   carvedilol (COREG) 6.25 MG tablet Take 6.25 mg by mouth 2 (two) times daily with meals.   Yes [provider]   insulin glargine (LANTUS) 100 UNIT/ML injection Inject 45 Units into the skin nightly.   Yes [provider]   metFORMIN (GLUCOPHAGE) 500 MG tablet Take 500 mg by mouth 2 (two) times daily with meals.   Yes [provider]   NIACIN PO Take by mouth.   Yes [provider]   SitaGLIPtin-MetFORMIN HCl (JANUMET PO) Take by mouth.   Yes [provider]       Family History:   Mother with Breast CA, CAD; bother DM    Social History:   Denies tobacco alcohol or illicit drugs; lives with wife    Review of Systems:   All other systems  were reviewed and are negative    Physical Exam:   Patient Vitals for the past 24 hrs:   BP Temp Pulse Resp SpO2 Height Weight   08/12/14 1143 (!) 157/97 mmHg - 92 16 97 % - -   08/12/14 1103 (!) 145/92 mmHg - 92 18 97 % - -   08/12/14 1043 (!) 152/95 mmHg - 94 17 97 % - -   08/12/14 1023 (!) 148/117 mmHg - 92 16 96 % - -   08/12/14 0729 (!) 148/102 mmHg 97.3 F (36.3 C) 93 (!) 24 98 % 1.829 m (6') 102.513 kg (226 lb)       General: very pleasant, slighly distressed [place] American male, well-developed, well-nourished, in mild respiratory distress  HEENT: perrla, eomi , sclera anicteric, conjunctivae clear,  oropharynx clear without lesions, mucous membranes moist  Neck: supple, non-tender, full range of motion, no lymphadenopathy, no nuchal rigidity,  no JVD, no carotid bruits, no thyromegaly    Cardiovascular: tachycardic, regular rate and rhythm, S1 S2,  no murmurs, rubs or gallops  Lungs: diminished bilaterally, upper airway expiratory wheezing, bibasilar crackles, moderate-labored respirations  Abdomen: tenderness in middle quadrant, periumbilical region, distended; no palpable masses, no hepatosplenomegaly, tympanic bowel sounds, no rebound tenderness or guarding  Extremities: no clubbing, cyanosis,  edema or calf tenderness, normal muscle tone and strength  Neuro:Alert and oriented X 3, cranial nerves II-XII intact,  normal strength upper and lower extremities, sensation intact, no motor or focal deficits  Skin: no rashes, or lesions noted, adequate turgor  Psych: Good eye contact, normal mood, affect, speech, well- groomed      Diagnostics:     Results     Procedure Component Value Units Date/Time    Hepatic function panel (LFT) [[id number]] Collected:  08/12/14 1150    Specimen Information:  Blood Updated:  08/12/14 1155    TSH [[id number]] Collected:  08/12/14 1150    Specimen Information:  Blood Updated:  08/12/14 1155    Magnesium [[id number]] Collected:  08/12/14 1150    Specimen Information:  Blood Updated:  08/12/14 1155    PT/APTT [[id number]] Collected:  08/12/14 1150     Updated:  08/12/14 1155    Hemoglobin A1C [[id number]] Collected:  08/12/14 1150    Specimen Information:  Blood Updated:  08/12/14 1155    Lactic acid, plasma [[id number]] Collected:  08/12/14 1150    Specimen Information:  Blood Updated:  08/12/14 1154    Narrative:      If not done in the ER  No tourniquet;on ice    Troponin I [[id number]] Collected:  08/12/14 1004    Specimen Information:  Blood Updated:  08/12/14 1037     Troponin I 0.04 ng/mL     Urinalysis [[id number]]  (Abnormal) Collected:  08/12/14 0943    Specimen Information:  Urine Updated:  08/12/14 1000     Urine Type Clean Catch      Color, UA YELLOW      Clarity, UA CLEAR      Specific Gravity UA 1.033      Urine pH 6.0      Leukocyte Esterase, UA NEGATIVE       Nitrite, UA NEGATIVE      Protein, UR 100 (A)      Glucose, UA NEGATIVE      Ketones UA 15 (A)      Urobilinogen, UA 0.2 mg/dL      Bilirubin, UA SMALL (A)      Blood, UA TRACE-LYSED (  A)     Microscopic, Urine [[id number]]  (Abnormal) Collected:  08/12/14 0943     RBC, UA 0 - 5 /hpf Updated:  08/12/14 1000     WBC, UA 0 - 5 /hpf      Squamous Epithelial Cells, Urine 0 - 5 /hpf      Urine Bacteria Occasional (A) /hpf      Urine Mucus Present     Comprehensive metabolic panel [[id number]]  (Abnormal) Collected:  08/12/14 0843    Specimen Information:  Blood Updated:  08/12/14 0915     Glucose 142 (H) mg/dL      BUN 78.2 mg/dL      Creatinine 0.9 mg/dL      Sodium 956 mEq/L      Potassium 5.0 mEq/L      Chloride 108 mEq/L      CO2 22 mEq/L      CALCIUM 9.0 mg/dL      Protein, Total 6.4 g/dL      Albumin 3.7 g/dL      AST (SGOT) 44 (H) U/L      ALT 75 (H) U/L      Alkaline Phosphatase 64 U/L      Bilirubin, Total 1.8 (H) mg/dL      Globulin 2.7 g/dL      Albumin/Globulin Ratio 1.4      Anion Gap 11.0     Lipase [[id number]] Collected:  08/12/14 0843    Specimen Information:  Blood Updated:  08/12/14 0915     Lipase 21 U/L     B-type Natriuretic Peptide (BNP) [[id number]]  (Abnormal) Collected:  08/12/14 0843    Specimen Information:  Blood Updated:  08/12/14 0915     B-Natriuretic Peptide 1229.5 (H) pg/mL     GFR [[id number]] Collected:  08/12/14 0843     EGFR >60.0 Updated:  08/12/14 0915    D-Dimer [[id number]]  (Abnormal) Collected:  08/12/14 0843     D-Dimer 0.61 (H) ug/mL FEU Updated:  08/12/14 0904    CBC with differential [[id number]] Collected:  08/12/14 0843    Specimen Information:  Blood / Blood Updated:  08/12/14 0852     WBC 8.27 x10 3/uL      RBC 5.02 x10 6/uL      Hgb 15.5 g/dL      Hematocrit 63.8 %      MCV 93.2 fL      MCH 30.9 pg      MCHC 33.1 g/dL      RDW 14 %      Platelets 143 x10 3/uL      MPV 12.2 fL      Neutrophils 56 %      Lymphocytes Automated 35 %      Monocytes 7 %      Eosinophils Automated 2  %      Basophils Automated 0 %      Immature Granulocyte 0 %      Neutrophils Absolute 4.59 x10 3/uL      Abs Lymph Automated 2.92 x10 3/uL      Abs Mono Automated 0.55 x10 3/uL      Abs Eos Automated 0.20 x10 3/uL      Absolute Baso Automated 0.01 x10 3/uL      Absolute Immature Granulocyte 0.03 x10 3/uL          Radiology:   CXR, reviewed by me:   Impression:  1. Mildly increased size of the heart shadow consistent with  cardiomegaly  or pericardial effusion.  2. New pulmonary edema and small bilateral pleural effusions.    CT abd/pelvis, pending    12-lead EKG: NSR, non specific st seg abnormalities      Assessment/Plan:   Admit to observation status because of acute CHF exacerbation, IV diuresis, abdominal pain/diarrhea, frequent labs and tele    Acute CHF exacerbation, secondary to non-ischemic cardiomyopathy, ED 20%; BNP>1100, pulmonary edema and B pleural effusions on CXR; IV Lasix 40 mg given in ED; cont daily Lasix 40 mg (not previously on diuretics, renal function wnl) or per Cardiology rec. Cont BB, ARB, Norvasc, Nitro paste for now; strict I+O, daily weights; telemetry; appreciate consultation from [facility].     Abdominal pain, acute on chronic; worsening symptoms with associated diarrhea, distention. Unclear etiology; colitis possibly infectious or ischemic; possible cholecystitis. CT pending. Check lactic acid; Stool studies C diff, O+P, guiac, culture. Monitor LFTS and CBC closely. Keep NPO until results of CT; Possible GI consult; hold off further Abx for now, no fever/leukocytosis.     Acute dyspnea, no hypoxia; sec to CHF exacerbation; 02 NC, IS, lasix given. Due to elevated ddimer, will give empiric Lovenox X1 dose now; no CTA due to poor IV access, possible VQ scan if able to tolerate, check BLE [place] r/o DVT    Chest pain, upper sternal pressure w respirations; r/o ACS, possible GI etiology, pleuritic component; Nitro prn, serial CE, cont med management; Cardiology to assess; telemetry, full dose  Lovenox given X1.     HTN with hypertensive urgency, sec to pain CHF exacerbation, missed meds; restart home meds w holding param; Nitro paste    NIDDM2, controlled, hold metformin; cont Lantus at 1/2 dose 20 units qhs; if NPO. Low dose insulin ss q4H accu checks if NPO    H/O PUD, GI proph PPI    VTE proph w Lovenox      Signed by: Zetta Bills, NP      ZO:[id number], Bonnetta Barry, MD

## 2014-08-13 ENCOUNTER — Observation Stay: Payer: Medicare Other

## 2014-08-13 DIAGNOSIS — I509 Heart failure, unspecified: Secondary | ICD-10-CM | POA: Diagnosis present

## 2014-08-13 LAB — ECG 12-LEAD
Atrial Rate: 81 {beats}/min
P Axis: 71 degrees
P-R Interval: 142 ms
Q-T Interval: 410 ms
QRS Duration: 90 ms
QTC Calculation (Bezet): 476 ms
R Axis: 22 degrees
T Axis: 108 degrees
Ventricular Rate: 81 {beats}/min

## 2014-08-13 LAB — COMPREHENSIVE METABOLIC PANEL
ALT: 59 U/L — ABNORMAL HIGH (ref 0–55)
AST (SGOT): 30 U/L (ref 5–34)
Albumin/Globulin Ratio: 1.2 (ref 0.9–2.2)
Albumin: 3.2 g/dL — ABNORMAL LOW (ref 3.5–5.0)
Alkaline Phosphatase: 54 U/L (ref 38–106)
Anion Gap: 11 (ref 5.0–15.0)
BUN: 12.8 mg/dL (ref 9.0–28.0)
Bilirubin, Total: 1.2 mg/dL (ref 0.2–1.2)
CO2: 23 mEq/L (ref 22–29)
Calcium: 8.7 mg/dL (ref 8.5–10.5)
Chloride: 107 mEq/L (ref 100–111)
Creatinine: 1.1 mg/dL (ref 0.7–1.3)
Globulin: 2.6 g/dL (ref 2.0–3.6)
Glucose: 140 mg/dL — ABNORMAL HIGH (ref 70–100)
Potassium: 4.2 mEq/L (ref 3.5–5.1)
Protein, Total: 5.8 g/dL — ABNORMAL LOW (ref 6.0–8.3)
Sodium: 141 mEq/L (ref 136–145)

## 2014-08-13 LAB — LIPID PANEL
Cholesterol / HDL Ratio: 5.4
Cholesterol: 174 mg/dL (ref 0–199)
HDL: 32 mg/dL — ABNORMAL LOW (ref >40)
LDL Calculated: 122 mg/dL — ABNORMAL HIGH (ref 0–99)
Triglycerides: 98 mg/dL (ref 34–149)
VLDL Calculated: 20 mg/dL (ref 10–40)

## 2014-08-13 LAB — CBC AND DIFFERENTIAL
Basophils Absolute Automated: 0.02 10*3/uL (ref 0.00–0.20)
Basophils Automated: 0 %
Eosinophils Absolute Automated: 0.32 10*3/uL (ref 0.00–0.70)
Eosinophils Automated: 4 %
Hematocrit: 45.5 % (ref 42.0–52.0)
Hgb: 15 g/dL (ref 13.0–17.0)
Immature Granulocytes Absolute: 0.02 10*3/uL
Immature Granulocytes: 0 %
Lymphocytes Absolute Automated: 3.91 10*3/uL (ref 0.50–4.40)
Lymphocytes Automated: 47 %
MCH: 30.6 pg (ref 28.0–32.0)
MCHC: 33 g/dL (ref 32.0–36.0)
MCV: 92.9 fL (ref 80.0–100.0)
MPV: 12.8 fL — ABNORMAL HIGH (ref 9.4–12.3)
Monocytes Absolute Automated: 0.64 10*3/uL (ref 0.00–1.20)
Monocytes: 8 %
Neutrophils Absolute: 3.36 10*3/uL (ref 1.80–8.10)
Neutrophils: 41 %
Platelets: 152 10*3/uL (ref 140–400)
RBC: 4.9 10*6/uL (ref 4.70–6.00)
RDW: 14 % (ref 12–15)
WBC: 8.25 10*3/uL (ref 3.50–10.80)

## 2014-08-13 LAB — GLUCOSE WHOLE BLOOD - POCT
Whole Blood Glucose POCT: 194 mg/dL — ABNORMAL HIGH (ref 70–100)
Whole Blood Glucose POCT: 170 mg/dL — ABNORMAL HIGH (ref 70–100)
Whole Blood Glucose POCT: 200 mg/dL — ABNORMAL HIGH (ref 70–100)
Whole Blood Glucose POCT: 197 mg/dL — ABNORMAL HIGH (ref 70–100)

## 2014-08-13 LAB — HEMOLYSIS INDEX: Hemolysis Index: 48 — ABNORMAL HIGH (ref 0–18)

## 2014-08-13 LAB — B-TYPE NATRIURETIC PEPTIDE: B-Natriuretic Peptide: 971.2 pg/mL — ABNORMAL HIGH (ref 0.0–100.0)

## 2014-08-13 LAB — GFR: EGFR: >60

## 2014-08-13 LAB — STOOL OCCULT BLOOD: Stool Occult Blood: NEGATIVE

## 2014-08-13 LAB — STOOL FOR WBC: Neutrophils Wright Stain: NONE SEEN

## 2014-08-13 LAB — MAGNESIUM: Magnesium: 2.1 mg/dL (ref 1.6–2.6)

## 2014-08-13 MED ORDER — HYDRALAZINE HCL 20 MG/ML IJ SOLN
10.0000 mg | Freq: Four times a day (QID) | INTRAMUSCULAR | Status: DC | PRN
Start: 2014-08-13 — End: 2014-08-16

## 2014-08-13 MED ORDER — FAMOTIDINE 10 MG/ML IV SOLN (WRAP)
20.0000 mg | Freq: Two times a day (BID) | INTRAVENOUS | Status: DC
Start: 2014-08-13 — End: 2014-08-16
  Administered 2014-08-13 – 2014-08-16 (×6): 20 mg via INTRAVENOUS
  Filled 2014-08-13 (×6): qty 2

## 2014-08-13 MED ORDER — CARVEDILOL 12.5 MG PO TABS
25.0000 mg | ORAL_TABLET | Freq: Two times a day (BID) | ORAL | Status: DC
Start: 2014-08-13 — End: 2014-08-16
  Administered 2014-08-13 – 2014-08-16 (×4): 25 mg via ORAL
  Filled 2014-08-13 (×4): qty 2

## 2014-08-13 MED ORDER — INSULIN GLARGINE 100 UNIT/ML SC SOLN
10.0000 [IU] | Freq: Once | SUBCUTANEOUS | Status: AC
Start: 2014-08-13 — End: 2014-08-13
  Administered 2014-08-13: 10 [IU] via SUBCUTANEOUS
  Filled 2014-08-13: qty 10

## 2014-08-13 MED ORDER — CARVEDILOL 12.5 MG PO TABS
12.5000 mg | ORAL_TABLET | Freq: Once | ORAL | Status: AC
Start: 2014-08-13 — End: 2014-08-13
  Administered 2014-08-13: 12.5 mg via ORAL
  Filled 2014-08-13: qty 1

## 2014-08-13 MED ORDER — INSULIN GLARGINE 100 UNIT/ML SC SOLN
30.0000 [IU] | Freq: Every evening | SUBCUTANEOUS | Status: DC
Start: 2014-08-13 — End: 2014-08-16
  Administered 2014-08-13 – 2014-08-15 (×3): 30 [IU] via SUBCUTANEOUS
  Filled 2014-08-13 (×3): qty 30

## 2014-08-13 NOTE — Consults (Signed)
Case Management Initial Discharge Planning Assessment     Psychosocial/Demographic Information   Name of interviewee/s: Patient.    Orientation and decision making abilities of patient (ie a&ox3 able to make decisions, demented patient, patient on vent, etc)   A & O X 3 and able to make his own decisions.    Does the patient have an Advance Directive? Location? (home/on chart, if home-advised to bring in copy?) <no information>  Advance Directive: Patient does not have advance directive], "Right to Decide" booklet provided.    Healthcare Decision Maker (HDM) (if other than the patient) Include relationship and contact information.  Self.    Any additional emergency contacts? Extended Emergency Contact Information  Primary Emergency Contact: [facility]  Address: [address]           [place], [place] [address] [place]  Mobile Phone: [phone]  Relation: Spouse   Pt lives with:  Living Arrangements: Spouse/significant other]lives with wife.    Type of residence where patient lives:    ]   ]   ]3 level TH with 10-15 steps to enter the middle level and about the same to go to the BR level on the 3rd level and the lower level.    ]   ]   Prior level of functioning (ambulation & ADL's)   ]independent in all spheres, including ambulation, ADLs, driving and working.    Support system-list  (i.e.church, friends, extended family, friends?) Wife. His kids are all out of state but has another family member in the area that can help if needed.      Do you want to designate an individual who will care for or assist you upon discharge? No    If yes: Please list the name, relationship, phone number, and address of the designated individual. Name:  Relationship:  Phone Number:  Address:       Correct Insurance listed on face sheet - verified with the patient/HDM Yes- [employer].       Discharge Planning Services in Place  Current LACE score? 9   Name of Primary Care Physician verified in patient banner  (update in patient banner if not listed) Zorita Pang, MD  [phone]   PCP Follow up apptmt offered/set up Patient prefers to schedule his own appt.    What DME does the patient currently own? (rolling walker, hospital bed, home O2, BiPAP/CPAP, bedside commode, cane, hoyer lift)    ]   ]   ]none.    Are PT/OT services indicated? If so, has it been ordered?  No, no    Has the patient been to an Acute Rehab or SNF in the past?  If so, where?   No    Does the patient currently have home health or hospice/palliative services in place?  If so, list agency name.   No    Does the patient already have community dialysis set up?  If so, where? No       Readmission Assessment  Is this patient an inpatient to inpatient 30 day readmission?  no    Previous admission discharge diagnosis N/A    Was patient readmitted from a facility?        Patient active with Home Health?        Patient active with Home Hospice?       Contributing factors to readmission (i.e., no follow up appt on previous d/c, unable to get meds, no insurance, no social support, etc.)  Did patient/family understand what medication was for and how to administer, symptoms to indicate worsening condition, activity and diet restrictions at time of previous d/c?               Anticipated Discharge Plan  Anticipated Disposition: Option A Home with no anticipated d/c needs at this time. CM will cont to f/u.    Anticipated Disposition: Option B     Who will transport the patient upon discharge? Wife.    If applicable, were SNF or Hospice choices provided?     Palliative Care Consult needed? (if yes, contact attending MD)   ([facility], [facility], [facility], [facility]) Consider Palliative Care Consult?: YES (08/12/14 1400)     ([facility] ONLY) Consider Palliative Care Consult?: NO (08/12/14 1400)      IInpatient Medicare/Medicare HMO Patients Only  Was an initial IMM signed within 24 hours of admission?  (Look in Media Tab, Documents Table or Shadow Chart)  n/A

## 2014-08-13 NOTE — Progress Notes (Signed)
[facility] Progress Note    Name:  [name] [name]  [birth year]  Medical Record Number:  [id number]    Date:  08/13/2014        SUBJECTIVE:   [name] [name]     Patient Active Problem List   Diagnosis   . Chest pain   . Diabetes mellitus   . Heart attack   . CAD (coronary artery disease)   . Hypertension   . Diarrhea   . Non-ischemic cardiomyopathy   . Acute exacerbation of CHF (congestive heart failure)   . Periumbilical abdominal pain   . PUD (peptic ulcer disease)   . Cholelithiasis   . Elevated LFTs   . Acute dyspnea   . Elevated d-dimer   . Chest pain on breathing   . Acute on chronic congestive heart failure, unspecified congestive heart failure type   . CHF (congestive heart failure)       68 yo male admitted with abdominal pain and diarrhea for almost 1 week prior to arrival.  He also has a history of 2 previous MI's and CHF.  Cardiac rhythm disturbance with placement of ICD.  Pain is described as generalized to left mid and LUQ.  Stool has been collected for studies.  US of the abdomen was neg. For cholelithiasis or signs of cholecystitis.  HIDA pending tomorrow.  His symptoms have slowly improved with less pain and diarrhea since he was admitted.       OBJECTIVE:      Scheduled Meds: PRN Meds:      amLODIPine 10 mg Oral Daily   aspirin 81 mg Oral Daily   carvedilol 25 mg Oral BID Meals   famotidine 20 mg Intravenous Q12H SCH   insulin glargine 30 Units Subcutaneous QHS   valsartan 160 mg Oral Daily       Continuous Infusions:      acetaminophen 650 mg Q4H PRN   acetaminophen 650 mg Q4H PRN   albuterol-ipratropium 3 mL Q4H PRN   dextrose 25 mL PRN   glucagon (rDNA) 1 mg PRN   hydrALAZINE 10 mg Q6H PRN   insulin aspart 1-5 Units PRN   morphine 2 mg Q4H PRN   morphine 2 mg Q4H PRN   ondansetron 4 mg Once PRN   ondansetron 4 mg Q6H PRN       BP 141/97 mmHg  Pulse 81  Temp(Src) 95.2 F (35.1 C) (Temporal Artery)  Resp 22  Ht 1.899 m (6' 2.76")  Wt 102.059 kg (225 lb)  BMI 28.30 kg/m2  SpO2 98%      Results     Procedure Component Value Units Date/Time    Glucose Whole Blood - POCT [[id number]]  (Abnormal) Collected:  08/13/14 1646     POCT - Glucose Whole blood 200 (H) mg/dL Updated:  47/82/95 6213    Ova and parasite screen [[id number]] Collected:  08/13/14 1116    Specimen Information:  Stool / Stool Updated:  08/13/14 1730    Narrative:      ORDER#: [id number]                                    ORDERED BY: Willow Ora, HEATH  SOURCE: Stool  COLLECTED:  08/13/14 11:16  ANTIBIOTICS AT COLL.:                                RECEIVED :  08/13/14 14:29  O and P Screen-Cryptosporidium/Giardia Ag  FINAL       08/13/14 17:30  08/13/14   Negative for Cryptosporidium parvum antigen             Negative for Giardia lamblia antigen      Magnesium [[id number]] Collected:  08/13/14 0416    Specimen Information:  Blood Updated:  08/13/14 1620     Magnesium 2.1 mg/dL     Clostridium difficile toxin B PCR [[id number]] Collected:  08/13/14 1116    Specimen Information:  Stool / Stool Updated:  08/13/14 1429    Narrative:      Patient's current admission began:->less than or equal to 3  days ago  Indication:->History of antibiotic use within the past 3  months  ??called todd he is sending soon,  08/13/2014  10:51 bw  DELAY AFFECTS RESULT    Culture, Stool, SALM/SHIGELLA/CAMPY/EHEC [[id number]] Collected:  08/13/14 1116    Specimen Information:  Stool / Stool Updated:  08/13/14 1429    Narrative:      DELAY AFFECTS RESULT    Glucose Whole Blood - POCT [[id number]]  (Abnormal) Collected:  08/13/14 1106     POCT - Glucose Whole blood 170 (H) mg/dL Updated:  62/95/28 4132    WBC, Stool [[id number]] Collected:  08/13/14 1116    Specimen Information:  Stool Updated:  08/13/14 1202     Segs Wright Stain None Seen     Narrative:      Q6H x 3 or if chest pain continues.  Send first STAT if last troponin was done greater than 4  hours ago.    Stool Occult Blood [[id number]] Collected:  08/13/14 1116     Specimen Information:  Stool Updated:  08/13/14 1141     Stool Occult Blood Negative     Narrative:      Q6H x 3 or if chest pain continues.  Send first STAT if last troponin was done greater than 4  hours ago.    Glucose Whole Blood - POCT [[id number]]  (Abnormal) Collected:  08/13/14 0734     POCT - Glucose Whole blood 194 (H) mg/dL Updated:  56/43/32 9518    Lipid panel  [[id number]]  (Abnormal) Collected:  08/13/14 0417    Specimen Information:  Blood Updated:  08/13/14 0717     Cholesterol 174 mg/dL      Triglycerides 98 mg/dL      HDL 32 (L) mg/dL      LDL Calculated 160 (H) mg/dL      VLDL Cholesterol Cal 20 mg/dL      CHOL/HDL Ratio 5.4     Hemolysis index [[id number]]  (Abnormal) Collected:  08/13/14 0417     Hemolysis Index 48 (H) Updated:  08/13/14 0717    Comprehensive metabolic panel [[id number]]  (Abnormal) Collected:  08/13/14 0417    Specimen Information:  Blood Updated:  08/13/14 0549     Glucose 140 (H) mg/dL      BUN 06.2 mg/dL      Creatinine 1.1 mg/dL      Sodium 376 mEq/L      Potassium 4.2 mEq/L      Chloride 107 mEq/L      CO2 23 mEq/L  CALCIUM 8.7 mg/dL      Protein, Total 5.8 (L) g/dL      Albumin 3.2 (L) g/dL      AST (SGOT) 30 U/L      ALT 59 (H) U/L      Alkaline Phosphatase 54 U/L      Bilirubin, Total 1.2 mg/dL      Globulin 2.6 g/dL      Albumin/Globulin Ratio 1.2      Anion Gap 11.0     GFR [[id number]] Collected:  08/13/14 0417     EGFR >60.0 Updated:  08/13/14 0549    B-type Natriuretic Peptide [[id number]]  (Abnormal) Collected:  08/13/14 0417    Specimen Information:  Blood Updated:  08/13/14 0545     B-Natriuretic Peptide 971.2 (H) pg/mL     CBC with differential [[id number]]  (Abnormal) Collected:  08/13/14 0417    Specimen Information:  Blood / Blood Updated:  08/13/14 0518     WBC 8.25 x10 3/uL      RBC 4.90 x10 6/uL      Hgb 15.0 g/dL      Hematocrit 84.6 %      MCV 92.9 fL      MCH 30.6 pg      MCHC 33.0 g/dL      RDW 14 %      Platelets 152 x10 3/uL      MPV 12.8 (H) fL       Neutrophils 41 %      Lymphocytes Automated 47 %      Monocytes 8 %      Eosinophils Automated 4 %      Basophils Automated 0 %      Immature Granulocyte 0 %      Neutrophils Absolute 3.36 x10 3/uL      Abs Lymph Automated 3.91 x10 3/uL      Abs Mono Automated 0.64 x10 3/uL      Abs Eos Automated 0.32 x10 3/uL      Absolute Baso Automated 0.02 x10 3/uL      Absolute Immature Granulocyte 0.02 x10 3/uL     Glucose Whole Blood - POCT [[id number]]  (Abnormal) Collected:  08/12/14 2209     POCT - Glucose Whole blood 213 (H) mg/dL Updated:  32/44/01 0272    Troponin I (Q6H x 3) [[id number]] Collected:  08/12/14 2014    Specimen Information:  Blood Updated:  08/12/14 2109     Troponin I 0.04 ng/mL     Narrative:      Q6H x 3 or if chest pain continues.          Radiology Results (24 Hour)     Procedure Component Value Units Date/Time    US Abdomen Limited Ruq [[id number]] Collected:  08/13/14 0358    Order Status:  Completed Updated:  08/13/14 0405    Narrative:      HISTORY: 2 months of upper abdominal pain.    PROCEDURE: Limited sonographic examination of the upper outer pelvis  performed using transabdominal real-time grayscale imaging.    FINDINGS: Proximal aorta is normal in caliber. Mid and distal aorta are  secured by bowel gas shadowing. Pancreas is obscured by bowel gas  shadowing. Included portion of the inferior vena cava is normal in  caliber. Right pleural effusion is present. There is no ascites. The  right kidney measures 12.7 cm in length. There is normal cortical  medullary differentiation. Multiple renal cysts are present. Largest  measures 4.6  cm in diameter. There are no focal liver lesions or  abnormally dilated biliary ducts. There is no cholelithiasis,  gallbladder wall thickening or pericholecystic fluid. Common bile duct  is normal in caliber. No sonographic Murphy's sign was elicited.      Impression:        1. Limited study due to bowel gas shadowing as described above.  2. No evidence of  cholelithiasis or acute cholecystitis.  3. Right pleural effusion.    Olen Pel, MD   08/13/2014 4:01 AM           @PHYSICAL  JXBJ@    Abd: mild/mod. Distention. Active BS and passing gas. Mild tenderness diffusely.  NO focal tenderness over GB. No peritoneal signs.   ASSESSMENT:   Abd. Pain, diarrhea, ?etiology.  His symptoms and presentation initially suggest GB disease is a remote possibility, atypical at best.  Would favor continuing management as you are, await HIDA results, although if he is requiring analgesics, and in the setting of CHF, the study may result in a false positive.        PLAN:   Will follow with you.

## 2014-08-13 NOTE — Progress Notes (Addendum)
[name] [name]        Date Time: 08/13/2014  2:28 PM  Patient Name:[name] [name]  ZOX:[id number]  PCP: [name] Pang, MD  Attending Physician:[name] Delgadillo Glenis Smoker M.D.      Chief Complaint:      Chief Complaint   Patient presents with   . Abdominal Pain   . Diarrhea       Subjective:     [name] [name] states that his shortness of breath is significantly improved.  However, he continues to have an abdominal pain, more precisely in the right upper quadrant and epigastric area  He denies any chest pain,  No new weakness tingling or numbness, No Cough  Assessment/Plan   Acute on chronic systolic congestive heart failure. 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  Patient negative by 850 mL   Appreciate cardiology input; await further recommendations  Strict intake and output and daily weights  Abdominal pain for 2 months; right upper quadrant/epigastric   Ultrasound of gallbladder and CT scan of the abdomen negative  Patient still wife is frustrated 20 Consult.  Therefore, Patient Will Be Seen by Dr. Welton Flakes; I have discussed the case briefly with him.  He recommends HIDA scan which is being ordered  Consult surgery to assess for possible cholecystectomy  Abnormal LFTs   Most likely secondary to hepatic congestion from congestive heart failure  Trending down  Continue to monitor  Hypertension  Resume current medications IV hydralazine as needed  Hyperlipidemia  Resume current medication  Insulin-dependent diabetes. 2  Hemoglobin A1c 7.5.  Continue Lantus at 35 minutes (patient takes 45 units at home) with insulin sliding scale and titrate dose as needed  Oral hypoglycemics on hold  History of peptic ulcer disease  Proton pump inhibitor    DVT Prohylaxis: Sequential compression devices pending surgical evaluation    Code Status: Full Code   Prognosis:guarded  Type of Admission: Inpatient   Estimated Length of Stay (including stay in the ER receiving treatment): Greater than 2 midnights  Medical Necessity for stay: Acute on chronic systolic congestive heart failure, acute cholecystitis, abnormal LFTs    Allergies:   No Known Allergies    Physical Exam:   Vitals reviewed:   height is 1.899 m (6' 2.76") and weight is 102.059 kg (225 lb). His temporal artery temperature is 98.8 F (37.1 C). His blood pressure is 114/76 and his pulse is 83. His respiration is 18 and oxygen saturation is 96%.   Body mass index is 28.3 kg/(m^2).  Filed Vitals:    08/13/14 0516 08/13/14 0833 08/13/14 0944 08/13/14 1413   BP: 118/78 146/107 155/95 114/76   Pulse: 79  95 83   Temp: 98.1 F (36.7 C)  97.3 F (36.3 C) 98.8 F (37.1 C)   TempSrc: Temporal Artery  Temporal Artery    Resp: 16  22 18    Height:       Weight:       SpO2: 96%  96% 96%     Intake and Output Summary (Last 24 hours) at Date Time    Intake/Output Summary (Last 24 hours) at 08/13/14 1428  Last data filed at 08/13/14 0516   Gross per 24 hour   Intake      0 ml   Output    850 ml   Net   -850 ml  Exam  Awake Alert, Oriented *3, No new F.N deficits, Normal affect  NC.AT,PERRAL  Supple Neck,No JVD, No cervical lymphadenopathy appriciated.   Symmetrical Chest wall movement, Good air movement bilaterally, crackles at lung bases  RRR,No Gallops,Rubs or new Murmurs, No Parasternal Heave  +ve B.Sounds, Abd Soft, right upper quadrant tenderness, No rebound -guarding or rigidity.  Trace edema; No new Rash or bruise    Consult Input/Plan     Plan  IP CONSULT TO CARDIOLOGY  IP CONSULT TO GENERAL SURGERY    Medications:     Current Facility-Administered Medications   Medication Dose Route Frequency Last Rate Last Dose   . acetaminophen (TYLENOL) tablet 650 mg  650 mg Oral Q4H PRN   650 mg at 08/12/14 1755   . acetaminophen (TYLENOL) tablet 650 mg  650 mg Oral Q4H PRN       .  albuterol-ipratropium (DUO-NEB) 2.5-0.5(3) mg/3 mL nebulizer 3 mL  3 mL Nebulization Q4H PRN   3 mL at 08/12/14 1157   . amLODIPine (NORVASC) tablet 10 mg  10 mg Oral Daily   10 mg at 08/13/14 1015   . aspirin chewable tablet 81 mg  81 mg Oral Daily   81 mg at 08/13/14 1015   . carvedilol (COREG) tablet 25 mg  25 mg Oral BID Meals       . dextrose 50 % bolus 25 mL  25 mL Intravenous PRN       . furosemide (LASIX) injection 40 mg  40 mg Intravenous BID   40 mg at 08/13/14 1015   . glucagon (rDNA) (GLUCAGEN) injection 1 mg  1 mg Intramuscular PRN       . hydrALAZINE (APRESOLINE) injection 10 mg  10 mg Intravenous Q6H PRN       . insulin aspart (NovoLOG) injection 1-5 Units  1-5 Units Subcutaneous PRN   1 Units at 08/13/14 1205   . insulin glargine (LANTUS) injection 30 Units  30 Units Subcutaneous QHS       . morphine injection 2 mg  2 mg Intravenous Q4H PRN       . morphine injection 2 mg  2 mg Intravenous Q4H PRN       . ondansetron (ZOFRAN) injection 4 mg  4 mg Intravenous Once PRN       . ondansetron (ZOFRAN) injection 4 mg  4 mg Intravenous Q6H PRN       . pantoprazole (PROTONIX) injection 40 mg  40 mg Intravenous Daily   40 mg at 08/13/14 1015   . valsartan (DIOVAN) tablet 160 mg  160 mg Oral Daily   160 mg at 08/13/14 1015          Labs:reviewed     Results     Procedure Component Value Units Date/Time    Glucose Whole Blood - POCT [[id number]]  (Abnormal) Collected:  08/13/14 1106     POCT - Glucose Whole blood 170 (H) mg/dL Updated:  52/84/13 2440    WBC, Stool [[id number]] Collected:  08/13/14 1116    Specimen Information:  Stool Updated:  08/13/14 1202     Segs Wright Stain None Seen     Narrative:      Q6H x 3 or if chest pain continues.  Send first STAT if last troponin was done greater than 4  hours ago.    Stool Occult Blood [[id number]] Collected:  08/13/14 1116    Specimen Information:  Stool Updated:  08/13/14 1141     Stool Occult  Blood Negative     Narrative:      Q6H x 3 or if chest pain  continues.  Send first STAT if last troponin was done greater than 4  hours ago.    Clostridium difficile toxin B PCR [[id number]] Collected:  08/13/14 1116    Specimen Information:  Stool / Stool Updated:  08/13/14 1130    Narrative:      Patient's current admission began:->less than or equal to 3  days ago  Indication:->History of antibiotic use within the past 3  months  ??called todd he is sending soon,  08/13/2014  10:51 bw  DELAY AFFECTS RESULT    Culture, Stool, SALM/SHIGELLA/CAMPY/EHEC [[id number]] Collected:  08/13/14 1116    Specimen Information:  Stool / Stool Updated:  08/13/14 1130    Narrative:      DELAY AFFECTS RESULT    Ova and parasite screen [[id number]] Collected:  08/13/14 1116    Specimen Information:  Stool / Stool Updated:  08/13/14 1127    Narrative:      Parasite Screen    Glucose Whole Blood - POCT [[id number]]  (Abnormal) Collected:  08/13/14 0734     POCT - Glucose Whole blood 194 (H) mg/dL Updated:  28/41/32 4401    Lipid panel  [[id number]]  (Abnormal) Collected:  08/13/14 0417    Specimen Information:  Blood Updated:  08/13/14 0717     Cholesterol 174 mg/dL      Triglycerides 98 mg/dL      HDL 32 (L) mg/dL      LDL Calculated 403 (H) mg/dL      VLDL Cholesterol Cal 20 mg/dL      CHOL/HDL Ratio 5.4     Hemolysis index [[id number]]  (Abnormal) Collected:  08/13/14 0417     Hemolysis Index 48 (H) Updated:  08/13/14 0717    Comprehensive metabolic panel [[id number]]  (Abnormal) Collected:  08/13/14 0417    Specimen Information:  Blood Updated:  08/13/14 0549     Glucose 140 (H) mg/dL      BUN 51.8 mg/dL      Creatinine 1.1 mg/dL      Sodium 841 mEq/L      Potassium 4.2 mEq/L      Chloride 107 mEq/L      CO2 23 mEq/L      CALCIUM 8.7 mg/dL      Protein, Total 5.8 (L) g/dL      Albumin 3.2 (L) g/dL      AST (SGOT) 30 U/L      ALT 59 (H) U/L      Alkaline Phosphatase 54 U/L      Bilirubin, Total 1.2 mg/dL      Globulin 2.6 g/dL      Albumin/Globulin Ratio 1.2      Anion Gap 11.0     GFR [[id number]]  Collected:  08/13/14 0417     EGFR >60.0 Updated:  08/13/14 0549    B-type Natriuretic Peptide [[id number]]  (Abnormal) Collected:  08/13/14 0417    Specimen Information:  Blood Updated:  08/13/14 0545     B-Natriuretic Peptide 971.2 (H) pg/mL     CBC with differential [[id number]]  (Abnormal) Collected:  08/13/14 0417    Specimen Information:  Blood / Blood Updated:  08/13/14 0518     WBC 8.25 x10 3/uL      RBC 4.90 x10 6/uL      Hgb 15.0 g/dL      Hematocrit 06.2 %  MCV 92.9 fL      MCH 30.6 pg      MCHC 33.0 g/dL      RDW 14 %      Platelets 152 x10 3/uL      MPV 12.8 (H) fL      Neutrophils 41 %      Lymphocytes Automated 47 %      Monocytes 8 %      Eosinophils Automated 4 %      Basophils Automated 0 %      Immature Granulocyte 0 %      Neutrophils Absolute 3.36 x10 3/uL      Abs Lymph Automated 3.91 x10 3/uL      Abs Mono Automated 0.64 x10 3/uL      Abs Eos Automated 0.32 x10 3/uL      Absolute Baso Automated 0.02 x10 3/uL      Absolute Immature Granulocyte 0.02 x10 3/uL     Glucose Whole Blood - POCT [[id number]]  (Abnormal) Collected:  08/12/14 2209     POCT - Glucose Whole blood 213 (H) mg/dL Updated:  40/98/11 9147    Troponin I (Q6H x 3) [[id number]] Collected:  08/12/14 2014    Specimen Information:  Blood Updated:  08/12/14 2109     Troponin I 0.04 ng/mL     Narrative:      Q6H x 3 or if chest pain continues.    Glucose Whole Blood - POCT [[id number]]  (Abnormal) Collected:  08/12/14 1646     POCT - Glucose Whole blood 166 (H) mg/dL Updated:  29/52/84 1324    Glucose Whole Blood - POCT [[id number]]  (Abnormal) Collected:  08/12/14 1446     POCT - Glucose Whole blood 109 (H) mg/dL Updated:  66/44/03 4742    Troponin I (Q6H x 3) [[id number]] Collected:  08/12/14 1417    Specimen Information:  Blood Updated:  08/12/14 1451     Troponin I 0.04 ng/mL     Narrative:      Q6H x 3 or if chest pain continues.  Send first STAT if last troponin was done greater than 4  hours ago.    Lactic acid, plasma [[id number]]  Collected:  08/12/14 1417    Specimen Information:  Blood Updated:  08/12/14 1435     Lactic acid 1.0 mmol/L     Hemoglobin A1C [[id number]]  (Abnormal) Collected:  08/12/14 1150    Specimen Information:  Blood Updated:  08/12/14 1433     Hemoglobin A1C 7.5 (H) %           Rads:reviewed   Radiological Procedure reviewed.  Radiology Results (24 Hour)     Procedure Component Value Units Date/Time    US Abdomen Limited Ruq [[id number]] Collected:  08/13/14 0358    Order Status:  Completed Updated:  08/13/14 0405    Narrative:      HISTORY: 2 months of upper abdominal pain.    PROCEDURE: Limited sonographic examination of the upper outer pelvis  performed using transabdominal real-time grayscale imaging.    FINDINGS: Proximal aorta is normal in caliber. Mid and distal aorta are  secured by bowel gas shadowing. Pancreas is obscured by bowel gas  shadowing. Included portion of the inferior vena cava is normal in  caliber. Right pleural effusion is present. There is no ascites. The  right kidney measures 12.7 cm in length. There is normal cortical  medullary differentiation. Multiple renal cysts are present. Largest  measures 4.6 cm in  diameter. There are no focal liver lesions or  abnormally dilated biliary ducts. There is no cholelithiasis,  gallbladder wall thickening or pericholecystic fluid. Common bile duct  is normal in caliber. No sonographic Murphy's sign was elicited.      Impression:        1. Limited study due to bowel gas shadowing as described above.  2. No evidence of cholelithiasis or acute cholecystitis.  3. Right pleural effusion.    Olen Pel, MD   08/13/2014 4:01 AM            Multi-Disciplinary Care Input:   Case management notes: No Patient Care Coordination [name] on file.     PT/OT/ST notes:          Signed by: Anselmo Pickler  08/13/2014 2:28 PM

## 2014-08-13 NOTE — Progress Notes (Addendum)
[place] [facility]  PROGRESS NOTE  [facility]    Date Time: 08/13/2014 4:35 PM  Patient Name: [facility] E       Patient Active Problem List   Diagnosis   . Chest pain   . Diabetes mellitus   . Heart attack   . CAD (coronary artery disease)   . Hypertension   . Diarrhea   . Non-ischemic cardiomyopathy   . Acute exacerbation of CHF (congestive heart failure)   . Periumbilical abdominal pain   . PUD (peptic ulcer disease)   . Cholelithiasis   . Elevated LFTs   . Acute dyspnea   . Elevated d-dimer   . Chest pain on breathing   . Acute on chronic congestive heart failure, unspecified congestive heart failure type   . CHF (congestive heart failure)       Assessment:      Acute systolic CHF   Nonischemic cmp ef of 20% by cath 3/14   abd pain   Mild cad without angina by cath 3/14   ICD, last interrogation 6/15   DM   HTN   Hyperlipidemia, intolerant of statins   H/o pud   Elevated lfts due to liver congestion from chf   Diarrhea    Recommendations:    Pt clinically is improving with IV diuretics, LFTs and BNP trending down nicely   For HIDA scan tomorrow to eval abd pain   Continue ASA and coreg, diovan.  On Norvasc as well for BP.    Coreg just increased for HTN, if still elevated tomorrow Will add aldactone   Pt with NSVT, ICD in place.  Electrolytes this am where adequate.  Follow, pt is asymptomatic didn't require ATP.    Medications:      Scheduled Meds: PRN Meds:      amLODIPine 10 mg Oral Daily   aspirin 81 mg Oral Daily   carvedilol 25 mg Oral BID Meals   furosemide 40 mg Intravenous BID   insulin glargine 30 Units Subcutaneous QHS   pantoprazole 40 mg Intravenous Daily   valsartan 160 mg Oral Daily       Continuous Infusions:      acetaminophen 650 mg Q4H PRN   acetaminophen 650 mg Q4H PRN   albuterol-ipratropium 3 mL Q4H PRN   dextrose 25 mL PRN   glucagon (rDNA) 1 mg PRN   hydrALAZINE 10 mg Q6H PRN   insulin aspart 1-5 Units PRN   morphine 2 mg Q4H PRN   morphine 2 mg Q4H PRN   ondansetron  4 mg Once PRN   ondansetron 4 mg Q6H PRN             Subjective:   Feels better, although still wheezing.        Physical Exam:     Filed Vitals:    08/13/14 1413   BP: 114/76   Pulse: 83   Temp: 98.8 F (37.1 C)   Resp: 18   SpO2: 96%     Temp (24hrs), Avg:97.8 F (36.6 C), Min:97.2 F (36.2 C), Max:98.8 F (37.1 C)      Telemetry reviewed no changes.  SR with 10 beat run of NSVT this am     Intake and Output Summary (Last 24 hours) at Date Time    Intake/Output Summary (Last 24 hours) at 08/13/14 1635  Last data filed at 08/13/14 0516   Gross per 24 hour   Intake      0 ml   Output  850 ml   Net   -850 ml     General Appearance:  Breathing comfortable, no acute distress  Neck:  No carotid bruit or jugular venous distension, brisk carotid upstroke  Lungs:  Decreased BS at right base.  clear to auscultation in upper fields bilaterally, some crackles at the L base.  good respiratory effort; wheezing bilaterally    Heart:  S1, S2 normal, no S3, no S4, no murmur, PMI not displaced, no rub   Abdomen:  Soft, non-tender, positive bowel sounds, no hepatojugular reflux  Extremities:  No cyanosis, clubbing or edema  Pulses:  Equal radial pulses, 4/4 symmetric  Neurologic:  Alert and oriented x3, mood and affect normal    Labs:     Recent Labs  Lab 08/12/14  2014 08/12/14  1417 08/12/14  1004   TROPONIN I 0.04 0.04 0.04     No results for input(s): DIG in the last 168 hours.    Recent Labs  Lab 08/13/14  0417   CHOLESTEROL 174   TRIGLYCERIDES 98   HDL 32*   CALCULATED LDL 122*       Recent Labs  Lab 08/13/14  0417 08/12/14  1150   BILIRUBIN, TOTAL 1.2 1.9*   BILIRUBIN, DIRECT  --  0.6*   PROTEIN, TOTAL 5.8* 6.2   ALBUMIN 3.2* 3.7   ALT 59* 74*   AST (SGOT) 30 41*       Recent Labs  Lab 08/13/14  0416   MAGNESIUM 2.1       Recent Labs  Lab 08/12/14  1150   PT 14.4   PT INR 1.1   PTT 30       Recent Labs  Lab 08/13/14  0417 08/12/14  0843   WBC 8.25 8.27   HEMOGLOBIN 15.0 15.5   HEMATOCRIT 45.5 46.8   PLATELETS 152 143        Recent Labs  Lab 08/13/14  0417 08/12/14  0843   SODIUM 141 141   POTASSIUM 4.2 5.0   CHLORIDE 107 108   CO2 23 22   BUN 12.8 15.2   CREATININE 1.1 0.9   EGFR >60.0 >60.0   GLUCOSE 140* 142*   CALCIUM 8.7 9.0       Recent Labs  Lab 08/12/14  1150   THYROID-STIMULATING HORMONE (TSH) BASELINE 0.74     Estimated Creatinine Clearance: 84.1 mL/min (based on Cr of 1.1).      Lab Results   Component Value Date    BNP 971.2* 08/13/2014      Imaging:   Radiological Procedure reviewed.        Signed by: Amado Nash, PA      Patient seen and examined, my assessment and plan as above.      [place] [facility]  NP Spectral[facility] [phone] (8am-5pm)  MD [employer] [phone] (8am-5pm)  After hours, non urgent consult line [phone]  After Hours, urgent consults [phone]

## 2014-08-13 NOTE — Consults (Signed)
GASTROENTEROLOGY CONSULTATION    Date Time: 08/13/2014 5:35 PM  Patient Name: [name] [name]  Attending Physician: Anselmo Pickler, MD  PCP:  Zorita Pang, MD    Assessment:    Diffuse,episodic abdominal pain of uncertain etiology with prior history of peptic ulcer disease. Recurrent peptic ulcer,gallbladder disease, low-flow state related to cardiomyopathy and low perfusion and lower GI allergies cannot be excluded.   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   Multiple other medical problems including diabetes, hypertension, hyperlipidemia.    Recommendations:    Agree with optimization of cardiac status and treatment of congestive heart failure/pleural effusions   Stool workup for infectious causes of diarrhea including stool for C. Difficile and enteric pathogens.   Switch PPI to Pepcid as the patient has history of diarrhea   [facility] HIDA scan was ordered to further evaluate gallbladder disease as an etiology of his symptoms.    If he has persistent symptoms of recurrent abdominal pain, further evaluation with upper endoscopy would be recommended, after the optimization of her cardiac status.   Obtain hepatitis profile    Problem List:   Principal Problem:    Acute exacerbation of CHF (congestive heart failure)  Active Problems:    Diarrhea    Non-ischemic cardiomyopathy    Periumbilical abdominal pain    PUD (peptic ulcer disease)    Cholelithiasis    Elevated LFTs    Acute dyspnea    Elevated d-dimer    Chest pain on breathing    Acute on chronic congestive heart failure, unspecified congestive heart failure type    CHF (congestive heart failure)    History of Present Illness:   [name] [name] is a 68 y.o. male who presents to the hospital with about 2 months  history of abdominal pain, discomfort and change in his bowel habits. He underwent workup including stool studies and apparently was treated with ciprofloxacin for about 10 days without significant relief in her symptoms. Subsequent CT scan in September of 2015 revealed no acute disease. The patient has prior history of gastric ulcer/duodenal ulcer diagnosed by upper GI series many years ago. He is complaining of episodic abdominal pain involving the mid abdomen and sometimes radiating to the upper abdomen, associated with loose stool and at times diarrhea. He denies any melena or rectal bleeding. He has no fever or chills. He denies any dysphagia or odynophagia. He does complain of some dyspepsia and occasional heartburn symptoms. He denies any hematemesis. He has no cough or sputum production. He denies any urinary complaints. The patient has significant cardiac history with nonischemic cardiomyopathy and ejection fraction of 20%. He does complain of shortness of breath and exertional dyspnea and orthopnea.  Additional information may have been entered in the problem list and other structured lists imported into this document.    Past Medical History:     Past Medical History   Diagnosis Date   . Heart attack 2011     and 2014   . Diabetes mellitus    . Hypertension    . Cardiac defibrillator in situ    . CAD (coronary artery disease)    . Ischemic cardiomyopathy      20%   . CHF (congestive heart failure) 08/12/2014     Past Surgical History:  Past Surgical History   Procedure Laterality Date   . Bring back open heart     . Hernia repair       LIH   . Duodenal ulcer  1973   . Orthopedic surgery       R foot corrective   . Tonsillectomy and adenoidectomy  1954     Family History:     Family History   Problem Relation Age of Onset   . Breast cancer Mother    . Heart attack Mother 18   . Diabetes Brother      Social History:     History     Social History   . Marital Status: Married     Spouse Name: Lajoyce Corners      Number of Children: 0   . Years of Education: N/A     Occupational History   . 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 Smoking status: Never Smoker    . Smokeless tobacco: Never Used   . Alcohol Use: No   . Drug Use: No   . Sexual Activity: No     Other Topics Concern   . Not on file     Social History Narrative     Allergies:   No Known Allergies  Medications:     Prescriptions prior to admission   Medication Sig   . amlodipine-valsartan (EXFORGE) 10-160 MG per tablet Take 1 tablet by mouth daily.   Marland Kitchen aspirin 81 MG chewable tablet Chew 1 tablet (81 mg total) by mouth daily.   . carvedilol (COREG) 6.25 MG tablet Take 6.25 mg by mouth 2 (two) times daily with meals.   . insulin glargine (LANTUS) 100 UNIT/ML injection Inject 45 Units into the skin nightly.   . metFORMIN (GLUCOPHAGE) 500 MG tablet Take 500 mg by mouth 2 (two) times daily with meals.   Marland Kitchen NIACIN PO Take 500 mg by mouth daily.      . SitaGLIPtin-MetFORMIN HCl (JANUMET PO) Take by mouth.     Review of Systems:   Psychological: negative for - depression, memory difficulties, mood swings or sleep disturbances  Ophthalmic: negative for - eye pain or loss of vision  ENT: negative for - oral lesions, sore throat or vocal changes  Respiratory:shortness of breath with minimal exertion and laying down.  Cardiovascular: negative for - chest pain, dyspnea on exertion, edema or rapid heart rate  Genito-Urinary: negative for - change in urinary stream, hematuria, incontinence or urinary frequency/urgency  Musculoskeletal: negative for - joint stiffness, joint swelling, joint or back pain  Neurological: negative for - confusion, dizziness, headaches, memory loss, seizures or weakness  Dermatological: negative for rash  Physical Exam:     Filed Vitals:    08/13/14 1413   BP: 114/76   Pulse: 83   Temp: 98.8 F (37.1 C)   Resp: 18   SpO2: 96%     Intake and Output Summary (Last 24 hours) at Date Time    Intake/Output Summary (Last 24 hours) at  08/13/14 1735  Last data filed at 08/13/14 0516   Gross per 24 hour   Intake      0 ml   Output    850 ml   Net   -850 ml     Mental status - alert, oriented to person, place, and time, normal mood, behavior, speech, dress, motor activity, and thought processes  Eyes - pupils equal and reactive, extraocular  eye movements intact  Mouth - mucous membranes moist, pharynx normal without lesions  Neck - supple, no significant adenopathy, mild JVD  Lymphatics - no palpable lymphadenopathy  Chest - clear to auscultation, with the bilateral crackles and decreased breath sound at the bases  Heart - normal rate, regular rhythm, normal S1, S2, no murmurs, rubs, clicks or gallops  Abdomen - soft, obese,nontender, nondistended, no masses or organomegaly. There is no masses no free fluid.  Neurological - alert, oriented, normal speech, no focal findings or movement disorder noted  Musculoskeletal - no joint tenderness, deformity or swelling  Extremities - peripheral pulses normal, no pedal edema, no clubbing or cyanosis  Skin - normal coloration and turgor, no rashes, no suspicious skin lesions noted  Labs:     Recent Labs      08/13/14   0417  08/12/14   1150  08/12/14   0843   WBC  8.25   --   8.27   HEMOGLOBIN  15.0   --   15.5   HEMATOCRIT  45.5   --   46.8   PLATELETS  152   --   143   PT   --   14.4   --    PT INR   --   1.1   --    PTT   --   30   --    ALBUMIN  3.2*  3.7  3.7   CALCIUM  8.7   --   9.0   LIPASE   --    --   21    Recent Labs      08/13/14   0417  08/12/14   1150  08/12/14   0843   SODIUM  141   --   141   POTASSIUM  4.2   --   5.0   CHLORIDE  107   --   108   CO2  23   --   22   BUN  12.8   --   15.2   CREATININE  1.1   --   0.9   GLUCOSE  140*   --   142*   ALBUMIN  3.2*  3.7  3.7   ALT  59*  74*  75*   AST (SGOT)  30  41*  44*   ALKALINE PHOSPHATASE  54  64  64   BILIRUBIN, TOTAL  1.2  1.9*  1.8*               Rads:   Echocardiogram Adult Complete W Clr/ Dopp Waveform    08/13/2014   ECHOCARDIOGRAM   Sonographer:  Steele Berg  Indications:  Dilated cardiomyopathy  Height (in):  74 Weight (lb):  225 Blood Pressure:  146/107    2-D Measurements  Left Ventricle                                           Diastolic Dimension:  68  (40-56 mm) Systolic Dimension:  62  (25-40 mm)      Septal Diastolic Thickness:  9  (6-11 mm)     Posterior Wall Thickness:  9  (6-11 mm)  Fractional Shortening Percentage:  9%  (24-46 %)                        Visually Estimated Ejection  Fraction:  15-20%  (55-75 %)                           Right Ventricle Diastolic Dimension:  30  (7-26 mm)                             Left Atrium End Systolic Dimension:  44  (19-40 mm)                      Aortic Root:  30  (20-37 mm)                         Doppler Measurements and Color Flow Imaging Valves                                          Aortic Valve:  1.1  (0.9-1.8 m/s).          Regurgitation:       Pulmonic Valve:  0.6  (0.6-0.9 m/s).      Regurgitation:  Mild  Mitral Valve:  0.9  (0.6-1.4 m/s).           Regurgitation:  Moderate  Tricuspid Valve:  0.5  (0.4-0.8 m/s.      Regurgitation:  30  Left Ventricular Outflow Tract Velocity:  0.8 m/s.   E/A Ratio:  0.8 Est. PASP:  37 Est. RA Mean Pressure:  5  Echocardiogram M-mode, 2D, spectral Doppler and color flow imaging were performed and interpreted.     08/13/2014     1.  The quality of the study is technically adequate for interpretation. 2.  The left ventricle is enlarged in size. Left ventricular systolic function is severely reduced. There is global hypokinesis. Estimated EF is 15-20%. There is no left ventricular hypertrophy. There is  evidence of abnormal diastolic LV function, consistent with at least grade 3 diastolic dysfunction 3.  The left atrium is enlarged in size. 4.  The aortic valve is trileaflet. Normal valve excursion is present. There is no significant aortic insufficiency. No aortic stenosis is present. The aortic root is normal in size. 5.  The mitral valve is structurally  normal. Moderate mitral insufficiency is present. 6.  The right ventricle is normal in size and function. 7.  The right atrium is normal in size. 8. The tricuspid valve is structurally normal. There is moderate tricuspid insufficiency. 9. The pulmonic valve is structurally normal. There is no significant pulmonic insufficiency. 10. There is no evidence of pulmonary hypertension. 11. No pericardial effusion, intracardiac thrombi, or masses are seen. 12. The atrial septum is well visualized.   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  Moderate mitral and tricuspid regurgitation.  Grade 3 diastolic dysfunction.   Audley Hose, MD  08/13/2014 4:46 PM     Chest 2 Views    08/12/2014   Indication: Shortness of breath.  Procedure: PA and lateral views of the chest.  Comparison: Previous exam March 13.  Findings: There is a left-sided defibrillator generator with lead extending into the right atrium, unchanged in position compared with previous exam. There is mild enlargement of the heart shadow, slightly increased from previous exam. Trace by lateral pleural effusions. Mild evidence of interstitial edema  including thickening of the fissures and indistinctness of the pulmonary vasculature.     08/12/2014   Impression: 1. Mildly increased size of the heart shadow consistent with cardiomegaly or pericardial effusion. 2. New pulmonary edema and small bilateral pleural effusions.  Wynema Birch, MD  08/12/2014 9:08 AM     US Abdomen Limited Ruq    08/13/2014   HISTORY: 2 months of upper abdominal pain.  PROCEDURE: Limited sonographic examination of the upper outer pelvis performed using transabdominal real-time grayscale imaging.  FINDINGS: Proximal aorta is normal in caliber. Mid and distal aorta are secured by bowel gas shadowing. Pancreas is obscured by bowel gas shadowing. Included portion of the inferior vena cava is normal in caliber.  Right pleural effusion is present. There is no ascites. The right kidney measures 12.7 cm in length. There is normal cortical medullary differentiation. Multiple renal cysts are present. Largest measures 4.6 cm in diameter. There are no focal liver lesions or abnormally dilated biliary ducts. There is no cholelithiasis, gallbladder wall thickening or pericholecystic fluid. Common bile duct is normal in caliber. No sonographic Murphy's sign was elicited.     08/13/2014    1. Limited study due to bowel gas shadowing as described above. 2. No evidence of cholelithiasis or acute cholecystitis. 3. Right pleural effusion.  Olen Pel, MD  08/13/2014 4:01 AM     US Venous Low Extrem Duplx Dopp Comp Bilat    08/12/2014   INDICATION: Dyspnea. Elevated d-dimer.  PROCEDURE: Ultrasound of the deep venous systems of both lower extremities.  FINDINGS: The deep venous systems of the lower extremities were imaged from the groins to the popliteal fossae. The veins were compressible at all levels. Normal waveforms were seen with normal respiratory variation of blood flow and normal augmentation of blood flow with calf compression. Limited evaluation of the calf veins was also performed.  Visualized calf veins were completely compressible.     08/12/2014    No evidence of lower extremity venous thrombosis.  Wynema Birch, MD  08/12/2014 1:14 PM     Ct Abd/pelvis With Po Contrast    08/12/2014   Indication: Abdominal pain.  Procedure: Unenhanced CT of the abdomen and pelvis. Oral contrast material is present. No IV contrast material.  Comparison: Previous CT September 9.  Findings: Small bilateral pleural effusions. Mild cardiomegaly. No pericardial effusion. Small foci of atelectasis are seen in the posterior lung bases. No ascites. Mild edema in the subcutaneous fat of the back and in the intra-abdominal fat. Several large exophytic renal cysts, unchanged from previous scan. No hydronephrosis. No urinary tract calculi.  Sensitivity for some abnormalities is limited without IV contrast material. No focal liver lesions are visible. Unremarkable gallbladder. No dilatation of the biliary tree. Unremarkable spleen, pancreas, adrenals, and abdominal aorta. No dilated bowel loops or bowel wall thickening. No free intraperitoneal gas. Normal appendix. No definite colonic diverticula. Degenerative changes of the spine are seen.     08/12/2014   Impression: 1. Bilateral pleural effusions. 2. Bilateral renal cysts. 3. No acute abnormal findings are seen in the abdomen.  Wynema Birch, MD  08/12/2014 12:47 PM     Signed by: Colon Branch, M.D.

## 2014-08-13 NOTE — Plan of Care (Signed)
Received pt from [facility], pt alert & verbally responsive, denies chest pain, no sob. Vitals stable at this time. Wife at the bedside. Needs attended. Call light within reach. Will continue to monitor.

## 2014-08-13 NOTE — Plan of Care (Signed)
Problem: Safety  Goal: Patient will be free from injury during hospitalization  Outcome: Progressing  Intervention: Assess patient's risk for falls and implement fall prevention plan of care per policy  Refuses bed alarm  Intervention: Provide and maintain safe environment  .      Intervention: Use appropriate transfer methods  independent  Intervention: Ensure appropriate safety devices are available at the bedside  Call bell in reach, bedside table in reach, bed in lowest position  Intervention: Include patient/family/caregiver in decisions related to safety  Wife at bedside  Intervention: Hourly rounding.  Marland Kitchen

## 2014-08-14 ENCOUNTER — Inpatient Hospital Stay: Payer: Medicare Other

## 2014-08-14 DIAGNOSIS — I429 Cardiomyopathy, unspecified: Secondary | ICD-10-CM

## 2014-08-14 DIAGNOSIS — R1033 Periumbilical pain: Secondary | ICD-10-CM

## 2014-08-14 DIAGNOSIS — R7989 Other specified abnormal findings of blood chemistry: Secondary | ICD-10-CM

## 2014-08-14 DIAGNOSIS — R0600 Dyspnea, unspecified: Secondary | ICD-10-CM

## 2014-08-14 LAB — COMPREHENSIVE METABOLIC PANEL
ALT: 40 U/L (ref 0–55)
AST (SGOT): 17 U/L (ref 5–34)
Albumin/Globulin Ratio: 1.3 (ref 0.9–2.2)
Albumin: 3 g/dL — ABNORMAL LOW (ref 3.5–5.0)
Alkaline Phosphatase: 57 U/L (ref 38–106)
Anion Gap: 9 (ref 5.0–15.0)
BUN: 14 mg/dL (ref 9.0–28.0)
Bilirubin, Total: 1 mg/dL (ref 0.2–1.2)
CO2: 27 mEq/L (ref 22–29)
Calcium: 8.6 mg/dL (ref 8.5–10.5)
Chloride: 103 mEq/L (ref 100–111)
Creatinine: 1.1 mg/dL (ref 0.7–1.3)
Globulin: 2.3 g/dL (ref 2.0–3.6)
Glucose: 139 mg/dL — ABNORMAL HIGH (ref 70–100)
Potassium: 4.1 mEq/L (ref 3.5–5.1)
Protein, Total: 5.3 g/dL — ABNORMAL LOW (ref 6.0–8.3)
Sodium: 139 mEq/L (ref 136–145)

## 2014-08-14 LAB — CBC AND DIFFERENTIAL
Basophils Absolute Automated: 0.01 10*3/uL (ref 0.00–0.20)
Basophils Automated: 0 %
Eosinophils Absolute Automated: 0.31 10*3/uL (ref 0.00–0.70)
Eosinophils Automated: 4 %
Hematocrit: 41.6 % — ABNORMAL LOW (ref 42.0–52.0)
Hgb: 13.4 g/dL (ref 13.0–17.0)
Immature Granulocytes Absolute: 0.01 10*3/uL
Immature Granulocytes: 0 %
Lymphocytes Absolute Automated: 3.12 10*3/uL (ref 0.50–4.40)
Lymphocytes Automated: 45 %
MCH: 30 pg (ref 28.0–32.0)
MCHC: 32.2 g/dL (ref 32.0–36.0)
MCV: 93.1 fL (ref 80.0–100.0)
MPV: 12.3 fL (ref 9.4–12.3)
Monocytes Absolute Automated: 0.47 10*3/uL (ref 0.00–1.20)
Monocytes: 7 %
Neutrophils Absolute: 3.08 10*3/uL (ref 1.80–8.10)
Neutrophils: 44 %
Platelets: 156 10*3/uL (ref 140–400)
RBC: 4.47 10*6/uL — ABNORMAL LOW (ref 4.70–6.00)
RDW: 14 % (ref 12–15)
WBC: 6.99 10*3/uL (ref 3.50–10.80)

## 2014-08-14 LAB — GFR: EGFR: >60

## 2014-08-14 LAB — GLUCOSE WHOLE BLOOD - POCT
Whole Blood Glucose POCT: 120 mg/dL — ABNORMAL HIGH (ref 70–100)
Whole Blood Glucose POCT: 228 mg/dL — ABNORMAL HIGH (ref 70–100)
Whole Blood Glucose POCT: 161 mg/dL — ABNORMAL HIGH (ref 70–100)

## 2014-08-14 LAB — HEMOLYSIS INDEX: Hemolysis Index: 7 (ref 0–18)

## 2014-08-14 LAB — HEPATITIS B SURFACE ANTIGEN W/ REFLEX TO CONFIRMATION: Hepatitis B Surface Antigen: NONREACTIVE

## 2014-08-14 LAB — B-TYPE NATRIURETIC PEPTIDE: B-Natriuretic Peptide: 568.1 pg/mL — ABNORMAL HIGH (ref 0.0–100.0)

## 2014-08-14 LAB — HEPATITIS C ANTIBODY: Hepatitis C, AB: NONREACTIVE

## 2014-08-14 LAB — HEPATITIS B CORE ANTIBODY, IGM: Hepatitis B Core IgM: NONREACTIVE

## 2014-08-14 MED ORDER — TECHNETIUM TC 99M MEBROFENIN
6.0000 | Freq: Once | Status: AC | PRN
Start: 2014-08-14 — End: 2014-08-14
  Administered 2014-08-14: 6 via INTRAVENOUS
  Filled 2014-08-14: qty 15

## 2014-08-14 MED ORDER — SPIRONOLACTONE 25 MG PO TABS
25.0000 mg | ORAL_TABLET | Freq: Every day | ORAL | Status: DC
Start: 2014-08-14 — End: 2014-08-16
  Administered 2014-08-14 – 2014-08-16 (×3): 25 mg via ORAL
  Filled 2014-08-14 (×3): qty 1

## 2014-08-14 MED ORDER — SINCALIDE 5 MCG IJ SOLR
0.0200 ug/kg | Freq: Once | INTRAMUSCULAR | Status: AC | PRN
Start: 2014-08-14 — End: 2014-08-14
  Administered 2014-08-14: 2 ug via INTRAVENOUS
  Filled 2014-08-14: qty 5

## 2014-08-14 NOTE — Progress Notes (Signed)
Patient glucose before lunch was 108.

## 2014-08-14 NOTE — Progress Notes (Signed)
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  Grade 2 diastolic dysfunction.  Came in with shortness of breath, abdominal pain surgery be consulted gastroenterology been consulted.  So far, does not look like acute cholecystitis, possible EGD tomorrow morning.  Nothing by mouth from midnight.    MEDICATIONS     Current Facility-Administered Medications   Medication Dose Route Frequency   . amLODIPine  10 mg Oral Daily   . aspirin  81 mg Oral Daily   . carvedilol  25 mg Oral BID Meals   . famotidine  20 mg Intravenous Q12H SCH   . insulin glargine  30 Units Subcutaneous QHS   . spironolactone  25 mg Oral Daily   . valsartan  160 mg Oral Daily          ROS   No abdominal pain no nausea vomiting, no diarrhea   No chest pain no palpitation  No fever no chills  No dysuria no hematuria  No dizziness no lightheadedness   No hallucinations or delusions   Shortness of breath improved.  No  PHYSICAL EXAM     Objective:  I/O last 3 completed shifts:  In: 1010 [P.O.:1010]  Out: 2250 [Urine:2250]   Patient reviewed by myself  Filed Vitals:    08/14/14 0939 08/14/14 1400 08/14/14 1455 08/14/14 1737   BP: 127/87 130/82 112/76 150/91   Pulse: 82 82  81   Temp: 97 F (36.1 C) 97.1 F (36.2 C)  97.2 F (36.2 C)   TempSrc: Temporal Artery      Resp: 18 18  18    Height:       Weight:       SpO2: 95% 95%  95%       Exam: Patient is awake, alert and oriented x 3, in no acute distress  HEENT: PERRLA, EOMI, Oral cavity well hydrated.  Neck: supple, without JVD  Chest: Mild basal crackles, no rhonchi  CVS: S1, S2 normal, no rubs, no murmurs, no gallops.  Regular rate and rhythm.  Abdomen: soft and non tender with good bowel sounds.  Musculoskeletal: No pitting edema,  no cyanosis/clubbing  NEURO:  No Focal neurological deficits      LABS   Patient labs reviewed by myself     Recent Labs  Lab 08/14/14  0454 08/13/14  0417 08/12/14  0843   WBC 6.99 8.25 8.27   RBC 4.47* 4.90 5.02   HEMOGLOBIN 13.4 15.0 15.5   HEMATOCRIT 41.6* 45.5 46.8   MCV 93.1 92.9 93.2   PLATELETS 156 152 143       Recent Labs  Lab 08/14/14  0454 08/13/14  0417 08/13/14  0416 08/12/14  1150 08/12/14  0843   SODIUM 139 141  --   --  141   POTASSIUM 4.1 4.2  --   --  5.0   CHLORIDE 103 107  --   --  108   CO2 27 23  --   --  22   BUN 14.0 12.8  --   --  15.2   CREATININE 1.1 1.1  --   --  0.9   GLUCOSE 139* 140*  --   --  142*   CALCIUM 8.6 8.7  --   --  9.0   MAGNESIUM  --   --  2.1 1.8  --      No results for input(s): OSMOLUR, NAUR in the last 168 hours.    Invalid input(s): OSMOL    Recent Labs  Lab 08/14/14  0454 08/13/14  0417 08/12/14  1150   ALT 40 59* 74*   AST (SGOT) 17 30 41*   BILIRUBIN, TOTAL 1.0 1.2 1.9*   BILIRUBIN, DIRECT  --   --  0.6*   ALBUMIN 3.0* 3.2* 3.7   ALKALINE PHOSPHATASE 57 54 64       Recent Labs  Lab 08/12/14  2014 08/12/14  1417 08/12/14  1004   TROPONIN I 0.04 0.04 0.04       Recent Labs  Lab 08/12/14  1150   PT INR 1.1   PT 14.4   PTT 30     228  Invalid input(s): BLOODCULTURE      RADIOLOGY   Patient radiological tests reviewed by myself  Radiological Procedure reviewed.  NM Hepatobiliary W CCK   Final Result   CLINICAL HISTORY: Persistent right upper quadrant pain.      FINDINGS: The patient was injected with 6 mCi of Tc 35m Choletec   intravenously. Subsequently, sequential imaging of the abdomen was   performed in the anterior projection, supine position over the course of   60 minutes.      There is normal hepatic uptake of radiotracer with normal excretion into   the biliary system. Tracer is visualized within the intrahepatic bile   ducts, common hepatic duct, and common bile duct within 15 minutes after   tracer administration. Gallbladder activity progresses throughout  the   remainder of the acquisition. There is no small bowel activity at 60   minutes. The clearance of activity from the liver parenchyma is normal.      The patient then received 2.0 mcg of CCK over a slow intravenous   infusion. The patient experienced no abdominal discomfort during CCK   administration. Sequential imaging obtained of the gallbladder over the   course of 60 minutes demonstrates a gallbladder ejection fraction of 13%   which is below normal. Normal is at least 38%. There is small bowel   activity shortly after CCK administration.      IMPRESSION:       1. No scintigraphic evidence for acute cholecystitis.   2. Below normal GBEF of 13%. This may be indicative of biliary   dyskinesia in the appropriate clinical setting.      Collene Schlichter, MD    08/14/2014 2:57 PM         Echocardiogram Adult Complete W Clr/ Dopp Waveform   Final Result   ECHOCARDIOGRAM      Sonographer:  Steele Berg      Indications:  Dilated cardiomyopathy      Height (in):  74   Weight (lb):  225   Blood Pressure:  146/107        2-D Measurements      Left Ventricle                                             Diastolic Dimension:  68  (40-56 mm)   Systolic Dimension:  62  (25-40 mm)  Septal Diastolic Thickness:  9  (6-11 mm)       Posterior Wall Thickness:  9  (6-11 mm)    Fractional Shortening Percentage:  9%  (24-46 %)                          Visually Estimated Ejection Fraction:  15-20%  (55-75 %)                                 Right Ventricle   Diastolic Dimension:  30  (7-26 mm)                                 Left Atrium   End Systolic Dimension:  44  (19-40 mm)                          Aortic Root:  30  (20-37 mm)                             Doppler Measurements and Color Flow Imaging   Valves                                              Aortic Valve:  1.1  (0.9-1.8 m/s).            Regurgitation:           Pulmonic Valve:  0.6  (0.6-0.9 m/s).        Regurgitation:  Mild      Mitral Valve:  0.9  (0.6-1.4 m/s).              Regurgitation:  Moderate      Tricuspid Valve:  0.5  (0.4-0.8 m/s.        Regurgitation:  30      Left Ventricular Outflow Tract Velocity:  0.8 m/s.       E/A Ratio:  0.8   Est. PASP:  37   Est. RA Mean Pressure:  5      Echocardiogram M-mode, 2D, spectral Doppler and color flow imaging were   performed and interpreted.      IMPRESSION:          1.  The quality of the study is technically adequate for interpretation.   2.  The left ventricle is enlarged in size. Left ventricular systolic   function is severely reduced. There is global hypokinesis. Estimated EF   is 15-20%. There is no left ventricular hypertrophy. There is  evidence   of abnormal diastolic LV function, consistent with at least grade 3   diastolic dysfunction   3.  The left atrium is enlarged in size.   4.  The aortic valve is trileaflet. Normal valve excursion is present.   There is no significant aortic insufficiency. No aortic stenosis is   present. The aortic root is normal in size.   5.  The mitral valve is structurally normal. Moderate mitral   insufficiency is present.   6.  The right ventricle is normal in size and function.   7.  The right atrium is normal in size.   8. The tricuspid valve is  structurally normal. There is moderate   tricuspid insufficiency.   9. The pulmonic valve is structurally normal. There is no significant   pulmonic insufficiency.   10. There is no evidence of pulmonary hypertension.   11. No pericardial effusion, intracardiac thrombi, or masses are seen.   12. The atrial septum is well visualized.       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      Moderate mitral and tricuspid regurgitation.      Grade 3 diastolic dysfunction.         Audley Hose, MD    08/13/2014 4:46 PM         US Abdomen Limited Ruq   Final Result   HISTORY: 2 months of upper abdominal pain.      PROCEDURE: Limited sonographic examination of the upper  outer pelvis   performed using transabdominal real-time grayscale imaging.      FINDINGS: Proximal aorta is normal in caliber. Mid and distal aorta are   secured by bowel gas shadowing. Pancreas is obscured by bowel gas   shadowing. Included portion of the inferior vena cava is normal in   caliber. Right pleural effusion is present. There is no ascites. The   right kidney measures 12.7 cm in length. There is normal cortical   medullary differentiation. Multiple renal cysts are present. Largest   measures 4.6 cm in diameter. There are no focal liver lesions or   abnormally dilated biliary ducts. There is no cholelithiasis,   gallbladder wall thickening or pericholecystic fluid. Common bile duct   is normal in caliber. No sonographic Murphy's sign was elicited.      IMPRESSION:       1. Limited study due to bowel gas shadowing as described above.   2. No evidence of cholelithiasis or acute cholecystitis.   3. Right pleural effusion.      Olen Pel, MD    08/13/2014 4:01 AM         US Venous Low Extrem Duplx Dopp Comp Bilat   Final Result   INDICATION: Dyspnea. Elevated d-dimer.      PROCEDURE: Ultrasound of the deep venous systems of both lower   extremities.      FINDINGS: The deep venous systems of the lower extremities were imaged   from the groins to the popliteal fossae. The veins were compressible at   all levels. Normal waveforms were seen with normal respiratory variation   of blood flow and normal augmentation of blood flow with calf   compression. Limited evaluation of the calf veins was also performed.    Visualized calf veins were completely compressible.      IMPRESSION:     No evidence of lower extremity venous thrombosis.      Wynema Birch, MD    08/12/2014 1:14 PM         CT Abd/Pelvis with PO Contrast   Final Result   Indication: Abdominal pain.      Procedure: Unenhanced CT of the abdomen and pelvis. Oral contrast   material is present. No IV contrast material.      Comparison: Previous CT  September 9.      Findings: Small bilateral pleural effusions. Mild cardiomegaly. No   pericardial effusion. Small foci of atelectasis are seen in the   posterior lung bases. No ascites. Mild edema in the subcutaneous fat of  the back and in the intra-abdominal fat. Several large exophytic renal   cysts, unchanged from previous scan. No hydronephrosis. No urinary tract   calculi. Sensitivity for some abnormalities is limited without IV   contrast material. No focal liver lesions are visible. Unremarkable   gallbladder. No dilatation of the biliary tree. Unremarkable spleen,   pancreas, adrenals, and abdominal aorta. No dilated bowel loops or bowel   wall thickening. No free intraperitoneal gas. Normal appendix. No   definite colonic diverticula. Degenerative changes of the spine are   seen.      IMPRESSION:    Impression:   1. Bilateral pleural effusions.   2. Bilateral renal cysts.   3. No acute abnormal findings are seen in the abdomen.      Wynema Birch, MD    08/12/2014 12:47 PM         Chest 2 Views   Final Result   Indication: Shortness of breath.      Procedure: PA and lateral views of the chest.      Comparison: Previous exam March 13.      Findings: There is a left-sided defibrillator generator with lead   extending into the right atrium, unchanged in position compared with   previous exam. There is mild enlargement of the heart shadow, slightly   increased from previous exam. Trace by lateral pleural effusions. Mild   evidence of interstitial edema including thickening of the fissures and   indistinctness of the pulmonary vasculature.      IMPRESSION:    Impression:   1. Mildly increased size of the heart shadow consistent with   cardiomegaly or pericardial effusion.   2. New pulmonary edema and small bilateral pleural effusions.      Wynema Birch, MD    08/12/2014 9:08 AM               Patient Active Problem List    Diagnosis Date Noted   . CHF (congestive heart failure) 08/13/2014   . Diarrhea  08/12/2014   . 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 Acute exacerbation of CHF (congestive heart failure) 08/12/2014   . Periumbilical abdominal pain 08/12/2014   . PUD (peptic ulcer disease) 08/12/2014   . Cholelithiasis 08/12/2014   . Elevated LFTs 08/12/2014   . Acute dyspnea 08/12/2014   . Elevated d-dimer 08/12/2014   . Chest pain on breathing 08/12/2014   . Acute on chronic congestive heart failure, unspecified congestive heart failure type    . Diabetes mellitus    . Heart attack      and 2014     . CAD (coronary artery disease)    . Hypertension    . Chest pain 12/29/2013       Assessment and Plan:       Patient was admitted with acute systolic congestive heart failure, hypoxia.  He was on nasal Oxygenation.  He Was Not Able to Lay down Flat.  Today, Patient Is off Oxygen.  He Can Lay down Flat without Any Problem.  He Did Walk around in His Room and Also Slightly in the hallway with no significant shortness of breath.  Continue Coreg, spironolactone, Diovan-patient will be nothing by mouth from midnight for possible EGD.  Clinically patient seems to be cleared cardiac wise with moderate risk.    -Abdominal pain going on for 2 months surgeon being consulted.  Imaging done along with HIDA scan does not look like  patient has acute cholecystitis.  Patient will go for EGD tomorrow.  Nothing by mouth from midnight.  -And normal LFTs secondary to congestion.  Congestive heart failure related.  -.  Hypertension, controlled.  Continue home medications.  -Dyslipidemia.  Statin on hold secondary to elevated LFTs.  -Diabetes mellitus, uncontrolled on Lantus 30 units at bedtime.  I will decrease it to 20 units.  Secondary patient will be nothing by mouth from midnight.  Continue sliding scale insulin.  Patient will need to be on DVT prophylaxis.  I will start from tomorrow.  Patient going for EGD tomorrow.    DVT prophylaxis:  SCDs  GI Prophylaxis:  pepcid   Type of admission..inpatient  Estimate  length of stay 1-2 days  Code status full    Zada Finders, MD  08/14/2014 5:45 PM

## 2014-08-14 NOTE — Consults (Signed)
Called [facility] and spoke with Shanda Bumps and referral made for SN due to LS 10 and CHF.

## 2014-08-14 NOTE — Plan of Care (Signed)
Problem: Health Promotion  Goal: Knowledge - disease process  Extent of understanding conveyed about a specific disease process.   Outcome: Progressing  Pt alert & verbally responsive. Assessment done, denies chest pain, no sob. Vitals stable at this time. Due meds given & well tolerated. Needs attended. Call light within reach. Will continue to monitor.

## 2014-08-14 NOTE — Progress Notes (Signed)
Received referral from CM Clista Bernhardt for home health services.  Attempted to see patient several times; however patient unavailable, down for HIDA scan.  Will continue to follow patient at a later time.

## 2014-08-14 NOTE — Plan of Care (Signed)
Problem: Safety  Goal: Patient will be free from injury during hospitalization  Outcome: Progressing  Patient free from injury this shift.  Fall precautions in place. Bed kept in low position. Call bell and patient items within reach at all times.  Hourly rounding in place.    Intervention: Assess patient's risk for falls and implement fall prevention plan of care per policy  .  Intervention: Provide and maintain safe environment  .  Intervention: Use appropriate transfer methods  .  Intervention: Ensure appropriate safety devices are available at the bedside  .  Intervention: Include patient/family/caregiver in decisions related to safety  .  Intervention: Hourly rounding.  .  Intervention: Assess for patient's risk for elopement and implement Elopement Risk plan per policy  .      Problem: Pain  Goal: Patient's pain/discomfort is manageable  Outcome: Progressing  Patient reports mild pain relieved with medications given per orders.  No pain medications needed this shift so far.  Will monitor closely and will administer medications per orders as needed.   Intervention: Include patient/family/caregiver in decisions related to pain management  .  Intervention: Offer non-pharmocological pain management interventions  .

## 2014-08-14 NOTE — Progress Notes (Signed)
[facility]  PROGRESS NOTE  [facility]    Date Time: 08/14/2014 1:19 PM  Patient Name: [facility]       Patient Active Problem List   Diagnosis   . Chest pain   . Diabetes mellitus   . Heart attack   . CAD (coronary artery disease)   . Hypertension   . Diarrhea   . Non-ischemic cardiomyopathy   . Acute exacerbation of CHF (congestive heart failure)   . Periumbilical abdominal pain   . PUD (peptic ulcer disease)   . Cholelithiasis   . Elevated LFTs   . Acute dyspnea   . Elevated d-dimer   . Chest pain on breathing   . Acute on chronic congestive heart failure, unspecified congestive heart failure type   . CHF (congestive heart failure)       Assessment:      Acute systolic CHF, improved - EF 15-20% on echo 08/13/14   Moderate MR, TR; grade 3 diastolic dysfunction   abd pain   Mild cad without angina by cath 3/14   ICD, last interrogation 6/15   DM   HTN   Hyperlipidemia, intolerant of statins   H/o pud   Elevated lfts due to liver congestion from chf   Diarrhea    Recommendations:    Continue ASA and coreg, diovan.  On Norvasc as well for BP.    Add aldactone    Medications:      Scheduled Meds: PRN Meds:        amLODIPine 10 mg Oral Daily   aspirin 81 mg Oral Daily   carvedilol 25 mg Oral BID Meals   famotidine 20 mg Intravenous Q12H SCH   insulin glargine 30 Units Subcutaneous QHS   valsartan 160 mg Oral Daily       Continuous Infusions:      acetaminophen 650 mg Q4H PRN   acetaminophen 650 mg Q4H PRN   albuterol-ipratropium 3 mL Q4H PRN   dextrose 25 mL PRN   glucagon (rDNA) 1 mg PRN   hydrALAZINE 10 mg Q6H PRN   insulin aspart 1-5 Units PRN   morphine 2 mg Q4H PRN   morphine 2 mg Q4H PRN   ondansetron 4 mg Once PRN   ondansetron 4 mg Q6H PRN             Subjective:   Feels well. No chest pain or dyspnea.        Physical Exam:     Filed Vitals:    08/14/14 0939   BP: 127/87   Pulse: 82   Temp: 97 F (36.1 C)   Resp: 18   SpO2: 95%     Temp (24hrs), Avg:96.9 F (36.1 C), Min:95.2 F  (35.1 C), Max:98.8 F (37.1 C)      Telemetry reviewed no changes.  SR with 10 beat run of NSVT this am     Intake and Output Summary (Last 24 hours) at Date Time    Intake/Output Summary (Last 24 hours) at 08/14/14 1319  Last data filed at 08/14/14 0600   Gross per 24 hour   Intake    890 ml   Output   1400 ml   Net   -510 ml     General Appearance:  Breathing comfortable, no acute distress  Neck:  No carotid bruit or jugular venous distension, brisk carotid upstroke  Lungs:  Decreased BS at right base.  clear to auscultation bilaterally,good respiratory effort    Heart:  S1, S2 normal, no S3, no S4, no murmur, PMI not displaced, no rub   Abdomen:  Soft, non-tender, positive bowel sounds  Extremities:  No cyanosis, clubbing or edema  Pulses:  Equal radial pulses, 4/4 symmetric  Neurologic:  Alert and oriented x3, mood and affect normal    Labs:       Recent Labs  Lab 08/12/14  2014 08/12/14  1417 08/12/14  1004   TROPONIN I 0.04 0.04 0.04     No results for input(s): DIG in the last 168 hours.    Recent Labs  Lab 08/13/14  0417   CHOLESTEROL 174   TRIGLYCERIDES 98   HDL 32*   CALCULATED LDL 122*       Recent Labs  Lab 08/14/14  0454  08/12/14  1150   BILIRUBIN, TOTAL 1.0  < > 1.9*   BILIRUBIN, DIRECT  --   --  0.6*   PROTEIN, TOTAL 5.3*  < > 6.2   ALBUMIN 3.0*  < > 3.7   ALT 40  < > 74*   AST (SGOT) 17  < > 41*   < > = values in this interval not displayed.    Recent Labs  Lab 08/13/14  0416   MAGNESIUM 2.1       Recent Labs  Lab 08/12/14  1150   PT 14.4   PT INR 1.1   PTT 30       Recent Labs  Lab 08/14/14  0454 08/13/14  0417 08/12/14  0843   WBC 6.99 8.25 8.27   HEMOGLOBIN 13.4 15.0 15.5   HEMATOCRIT 41.6* 45.5 46.8   PLATELETS 156 152 143       Recent Labs  Lab 08/14/14  0454 08/13/14  0417 08/12/14  0843   SODIUM 139 141 141   POTASSIUM 4.1 4.2 5.0   CHLORIDE 103 107 108   CO2 27 23 22    BUN 14.0 12.8 15.2   CREATININE 1.1 1.1 0.9   EGFR >60.0 >60.0 >60.0   GLUCOSE 139* 140* 142*   CALCIUM 8.6 8.7 9.0        Recent Labs  Lab 08/12/14  1150   THYROID-STIMULATING HORMONE (TSH) BASELINE 0.74     Estimated Creatinine Clearance: 84.1 mL/min (based on Cr of 1.1).      Lab Results   Component Value Date    BNP 568.1* 08/14/2014      Imaging:   Radiological Procedure reviewed.        Signed by: Encarnacion Slates, MD      [facility]  NP [employer] [phone] (8am-5pm)  MD [employer] [phone] (8am-5pm)  After hours, non urgent consult line [phone]  After Hours, urgent consults [phone]

## 2014-08-14 NOTE — Progress Notes (Signed)
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 This is less likely etiology of his abdominal pain symptoms.   Intermittent abdominal pain with diarrhea and prior history of peptic ulcer disease. Recurrent peptic ulcer disease need to be ruled out.   Congestive heart failure with nonischemic cardiomyopathy   Multiple other medical problems including diabetes, hypertension and hyperlipidemia    Plan:    Agree with conservative approach as recommended by surgical evaluation.   Upper endoscopy if cleared by cardiology to rule out recurrent peptic ulcer disease as an etiology of abdominal pain.   Continue with Pepcid   Low-fat diet as tolerable   I discussed the risks, benefits and alternatives of procedure upper anoscopy in detail with the patient and he was agreeable.    Problem List:     Patient Active Problem List   Diagnosis   . Chest pain   . Diabetes mellitus   . Heart attack   . CAD (coronary artery disease)   . Hypertension   . Diarrhea   . Non-ischemic cardiomyopathy   . Acute exacerbation of CHF (congestive heart failure)   . Periumbilical abdominal pain   . PUD (peptic ulcer disease)   . Cholelithiasis   . Elevated LFTs   . Acute dyspnea   . Elevated d-dimer   . Chest pain on breathing   . Acute on chronic congestive heart failure, unspecified congestive heart failure type   . CHF (congestive heart failure)       Subjective:   The patient had no further symptoms of diarrhea. He is feeling better today and was able to eat well without significant abdominal pain. His breathing is better and he denies any wheezing or cough. He denies any fever or chills there he has had 2 bowel movements which was solid.    Medications:     Current Facility-Administered Medications   Medication Dose Route  Frequency   . amLODIPine  10 mg Oral Daily   . aspirin  81 mg Oral Daily   . carvedilol  25 mg Oral BID Meals   . famotidine  20 mg Intravenous Q12H SCH   . insulin glargine  30 Units Subcutaneous QHS   . spironolactone  25 mg Oral Daily   . valsartan  160 mg Oral Daily         Physical Exam:     Filed Vitals:    08/14/14 1737   BP: 150/91   Pulse: 81   Temp: 97.2 F (36.2 C)   Resp: 18   SpO2: 95%       Intake and Output Summary (Last 24 hours) at Date Time    Intake/Output Summary (Last 24 hours) at 08/14/14 1924  Last data filed at 08/14/14 1400   Gross per 24 hour   Intake    950 ml   Output   1450 ml   Net   -500 ml       Temp (24hrs), Avg:97 F (36.1 C), Min:96.8 F (36 C), Max:97.2 F (36.2 C)      GENERAL: Patient is in no acute distress   HEENT: No scleral icterus or conjunctival pallor, moist mucous membranes   NECK: No jugular venous distention or thyromegaly, normal carotid upstrokes without bruits   CARDIAC:  S1 & S2 regular,   CHEST: Clear to auscultation bilaterally, normal respiratory effort  ABDOMEN:  Nontender,obese, non-distended, good bowel sounds, no masses, or hepatosplenomegaly, No abdominal bruits  EXTREMITIES: No clubbing, cyanosis, positive trace edema   SKIN: No rash or jaundice   NEUROLOGIC: Alert and oriented to time, place and person, normal mood and affect   MUSCULOSKELETAL: Normal muscle strength and tone.    Labs:     Results     Procedure Component Value Units Date/Time    Glucose Whole Blood - POCT [[id number]]  (Abnormal) Collected:  08/14/14 1710     POCT - Glucose Whole blood 228 (H) mg/dL Updated:  40/98/11 9147    Hepatitis B (HBV) Core antibody, IgM [[id number]] Collected:  08/14/14 0454    Specimen Information:  Blood Updated:  08/14/14 1423     Hep B C IgM NonReactive     Culture, Stool, SALM/SHIGELLA/CAMPY/EHEC [[id number]] Collected:  08/13/14 1116    Specimen Information:  Stool / Stool Updated:  08/14/14 1328    Narrative:      ORDER#: [id number]                                     ORDERED BY: Willow Ora, HEATH  SOURCE: Stool                                        COLLECTED:  08/13/14 11:16  ANTIBIOTICS AT COLL.:                                RECEIVED :  08/13/14 14:29  Culture, Stool, SALM/SHIGELLA/CAMPY/EHEC   FINAL       08/14/14 11:09  08/14/14   Normal enteric flora             No Salmonella or Shigella isolated  Shiga Toxin Assay                          FINAL       08/14/14 13:28  08/14/14   Negative for Shiga toxin 1 and 2  Campylobacter Antigen Assay                FINAL       08/13/14 21:40  08/13/14   Negative for Campylobacter Antigen      Hepatitis C (HCV) antibody, Total [[id number]] Collected:  08/14/14 0454    Specimen Information:  Blood Updated:  08/14/14 1101     Hepatitis C, AB Non-Reactive     Hepatitis B (HBV) Surface Antigen [[id number]] Collected:  08/14/14 0454    Specimen Information:  Blood Updated:  08/14/14 1101     Hepatitis B Surface AG Non-Reactive     Hemolysis index [[id number]] Collected:  08/14/14 0454     Hemolysis Index 7 Updated:  08/14/14 1032    Glucose Whole Blood - POCT [[id number]]  (Abnormal) Collected:  08/14/14 0725     POCT - Glucose Whole blood 120 (H) mg/dL Updated:  88/41/66 0630    Comprehensive metabolic panel [[id number]]  (Abnormal) Collected:  08/14/14 0454    Specimen Information:  Blood Updated:  08/14/14 0602     Glucose 139 (H) mg/dL      BUN 55.7 mg/dL  Creatinine 1.1 mg/dL      Sodium 161 mEq/L      Potassium 4.1 mEq/L      Chloride 103 mEq/L      CO2 27 mEq/L      CALCIUM 8.6 mg/dL      Protein, Total 5.3 (L) g/dL      Albumin 3.0 (L) g/dL      AST (SGOT) 17 U/L      ALT 40 U/L      Alkaline Phosphatase 57 U/L      Bilirubin, Total 1.0 mg/dL      Globulin 2.3 g/dL      Albumin/Globulin Ratio 1.3      Anion Gap 9.0     GFR [[id number]] Collected:  08/14/14 0454     EGFR >60.0 Updated:  08/14/14 0602    B-type Natriuretic Peptide [[id number]]  (Abnormal) Collected:  08/14/14 0454    Specimen Information:  Blood Updated:   08/14/14 0602     B-Natriuretic Peptide 568.1 (H) pg/mL     CBC with differential [[id number]]  (Abnormal) Collected:  08/14/14 0454    Specimen Information:  Blood / Blood Updated:  08/14/14 0529     WBC 6.99 x10 3/uL      RBC 4.47 (L) x10 6/uL      Hgb 13.4 g/dL      Hematocrit 57.8 (L) %      MCV 93.1 fL      MCH 30.0 pg      MCHC 32.2 g/dL      RDW 14 %      Platelets 156 x10 3/uL      MPV 12.3 fL      Neutrophils 44 %      Lymphocytes Automated 45 %      Monocytes 7 %      Eosinophils Automated 4 %      Basophils Automated 0 %      Immature Granulocyte 0 %      Neutrophils Absolute 3.08 x10 3/uL      Abs Lymph Automated 3.12 x10 3/uL      Abs Mono Automated 0.47 x10 3/uL      Abs Eos Automated 0.31 x10 3/uL      Absolute Baso Automated 0.01 x10 3/uL      Absolute Immature Granulocyte 0.01 x10 3/uL     Clostridium difficile toxin B PCR [[id number]] Collected:  08/13/14 1116    Specimen Information:  Stool / Stool Updated:  08/13/14 2303    Narrative:      Patient's current admission began:->less than or equal to 3  days ago  Indication:->History of antibiotic use within the past 3  months  ORDER#: [id number]                                    ORDERED BY: [facility], HEATH  SOURCE: Stool                                        COLLECTED:  08/13/14 11:16  ANTIBIOTICS AT COLL.:                                RECEIVED :  08/13/14 14:29  Clostridium difficile toxin B PCR  FINAL       08/13/14 23:03  08/13/14   Negative             Test performed using a BDMax PCR assay.             Due to the high sensitivity of the assay, duplicate testing             is not recommended. Testing will be restricted to one             specimen per 7 days (contact the laboratory to request             additional testing on a case by case basis).             Molecular detection of the C. difficile toxin B gene is not             recommended as a test of cure or for surveillance purposes.             Testing will not be performed on  formed stool.      Glucose Whole Blood - POCT [[id number]]  (Abnormal) Collected:  08/13/14 2103     POCT - Glucose Whole blood 197 (H) mg/dL Updated:  47/82/95 6213              Rads:   Radiological Procedure reviewed.    Signed by: Colon Branch

## 2014-08-14 NOTE — Progress Notes (Addendum)
Home Health Referral          Referral from Clista Bernhardt (Case Manager) for home health care upon discharge.    By [employer], the patient has the right to freely choose a home care provider.  Arrangements have been made with:     A company of the patients choosing. We have supplied the patient with a listing of providers in your area who asked to be included and participate in Medicare.   [facility], a home care agency that provides both adult home care services which is a wholly owned and operated by [employer] and participates in [employer]   The preferred provider of your insurance company. Choosing a home care provider other than your insurance company's preferred provider may affect your insurance coverage.    The Home Health Care Referral Form acknowledging the voluntary selection of the home care company has been completed, signed, and is on file.      Home Health Discharge Information     Your doctor has ordered Skilled Nursing in-home service(s) for you while you recuperate at home, to assist you in the transition from hospital to home.      The agency that you or your representative chose to provide the service:  Name of Home Health Agency: [facility] [phone]      The above services were set up by:  Julien Girt  [facility] Liaison)   Phone         [phone]                                    Additional comments: If you have not heard from your home health agency within 24-48 hours after discharge please call your agency to arrange a time for your first visit.  For any scheduling concerns or questions related to home health, such as time or date please contact your home health agency at the number listed above.       Signed by: Berton Mount Uy-Le  Date Time: 08/14/2014 4:15 PM

## 2014-08-15 ENCOUNTER — Inpatient Hospital Stay: Payer: Medicare Other | Admitting: Anesthesiology

## 2014-08-15 ENCOUNTER — Encounter
Admission: EM | Disposition: A | Discharge status: Home Health | Payer: Self-pay | Source: Home / Self Care | Attending: Internal Medicine

## 2014-08-15 HISTORY — PX: EGD: SHX3789

## 2014-08-15 LAB — GFR: EGFR: >60

## 2014-08-15 LAB — CBC AND DIFFERENTIAL
Basophils Absolute Automated: 0.02 10*3/uL (ref 0.00–0.20)
Basophils Automated: 0 %
Eosinophils Absolute Automated: 0.27 10*3/uL (ref 0.00–0.70)
Eosinophils Automated: 4 %
Hematocrit: 44.8 % (ref 42.0–52.0)
Hgb: 14.4 g/dL (ref 13.0–17.0)
Immature Granulocytes Absolute: 0.02 10*3/uL
Immature Granulocytes: 0 %
Lymphocytes Absolute Automated: 3.13 10*3/uL (ref 0.50–4.40)
Lymphocytes Automated: 43 %
MCH: 30.3 pg (ref 28.0–32.0)
MCHC: 32.1 g/dL (ref 32.0–36.0)
MCV: 94.1 fL (ref 80.0–100.0)
MPV: 12.5 fL — ABNORMAL HIGH (ref 9.4–12.3)
Monocytes Absolute Automated: 0.45 10*3/uL (ref 0.00–1.20)
Monocytes: 6 %
Neutrophils Absolute: 3.4 10*3/uL (ref 1.80–8.10)
Neutrophils: 47 %
Platelets: 163 10*3/uL (ref 140–400)
RBC: 4.76 10*6/uL (ref 4.70–6.00)
RDW: 14 % (ref 12–15)
WBC: 7.27 10*3/uL (ref 3.50–10.80)

## 2014-08-15 LAB — GLUCOSE WHOLE BLOOD - POCT
Whole Blood Glucose POCT: 155 mg/dL — ABNORMAL HIGH (ref 70–100)
Whole Blood Glucose POCT: 148 mg/dL — ABNORMAL HIGH (ref 70–100)
Whole Blood Glucose POCT: 132 mg/dL — ABNORMAL HIGH (ref 70–100)
Whole Blood Glucose POCT: 80 mg/dL (ref 70–100)
Whole Blood Glucose POCT: 134 mg/dL — ABNORMAL HIGH (ref 70–100)

## 2014-08-15 LAB — COMPREHENSIVE METABOLIC PANEL
ALT: 37 U/L (ref 0–55)
AST (SGOT): 23 U/L (ref 5–34)
Albumin/Globulin Ratio: 1.2 (ref 0.9–2.2)
Albumin: 3.3 g/dL — ABNORMAL LOW (ref 3.5–5.0)
Alkaline Phosphatase: 61 U/L (ref 38–106)
Anion Gap: 11 (ref 5.0–15.0)
BUN: 14 mg/dL (ref 9.0–28.0)
Bilirubin, Total: 1 mg/dL (ref 0.2–1.2)
CO2: 24 mEq/L (ref 22–29)
Calcium: 8.8 mg/dL (ref 8.5–10.5)
Chloride: 105 mEq/L (ref 100–111)
Creatinine: 1 mg/dL (ref 0.7–1.3)
Globulin: 2.8 g/dL (ref 2.0–3.6)
Glucose: 145 mg/dL — ABNORMAL HIGH (ref 70–100)
Potassium: 4.9 mEq/L (ref 3.5–5.1)
Protein, Total: 6.1 g/dL (ref 6.0–8.3)
Sodium: 140 mEq/L (ref 136–145)

## 2014-08-15 LAB — B-TYPE NATRIURETIC PEPTIDE: B-Natriuretic Peptide: 594.6 pg/mL — ABNORMAL HIGH (ref 0.0–100.0)

## 2014-08-15 SURGERY — DONT USE, USE 1095-ESOPHAGOGASTRODUODENOSCOPY (EGD), DIAGNOSTIC
Anesthesia: Anesthesia General | Site: Anus | Wound class: Clean Contaminated

## 2014-08-15 MED ORDER — BUTALBITAL-APAP-CAFFEINE 50-325-40 MG PO TABS
1.0000 | ORAL_TABLET | ORAL | Status: DC | PRN
Start: 2014-08-15 — End: 2014-08-16
  Administered 2014-08-15: 1 via ORAL
  Filled 2014-08-15: qty 1

## 2014-08-15 MED ORDER — EPHEDRINE SULFATE 50 MG/ML IJ SOLN
INTRAMUSCULAR | Status: DC | PRN
Start: 2014-08-15 — End: 2014-08-15
  Administered 2014-08-15: 5 mg via INTRAVENOUS

## 2014-08-15 MED ORDER — PHENYLEPHRINE 10 MCG/ML IV BOLUS (ANESTHESIA)
INTRAVENOUS | Status: DC | PRN
Start: 2014-08-15 — End: 2014-08-15
  Administered 2014-08-15 (×2): 100 ug via INTRAVENOUS

## 2014-08-15 MED ORDER — FUROSEMIDE 10 MG/ML IJ SOLN
20.0000 mg | Freq: Once | INTRAMUSCULAR | Status: AC
Start: 2014-08-15 — End: 2014-08-15
  Administered 2014-08-15: 20 mg via INTRAVENOUS
  Filled 2014-08-15: qty 2

## 2014-08-15 MED ORDER — HYDROMORPHONE HCL 1 MG/ML IJ SOLN
0.5000 mg | INTRAMUSCULAR | Status: DC | PRN
Start: 2014-08-15 — End: 2014-08-15

## 2014-08-15 MED ORDER — MEPERIDINE HCL 25 MG/ML IJ SOLN
25.0000 mg | INTRAMUSCULAR | Status: DC | PRN
Start: 2014-08-15 — End: 2014-08-15

## 2014-08-15 MED ORDER — SPIRONOLACTONE 25 MG PO TABS
25.0000 mg | ORAL_TABLET | Freq: Every day | ORAL | Status: DC
Start: 2014-08-15 — End: 2014-08-15

## 2014-08-15 MED ORDER — CARVEDILOL 25 MG PO TABS
25.0000 mg | ORAL_TABLET | Freq: Two times a day (BID) | ORAL | Status: DC
Start: 2014-08-15 — End: 2015-05-08

## 2014-08-15 MED ORDER — ACETAMINOPHEN 325 MG PO TABS
650.0000 mg | ORAL_TABLET | Freq: Once | ORAL | Status: DC | PRN
Start: 2014-08-15 — End: 2014-08-15

## 2014-08-15 MED ORDER — FAMOTIDINE 20 MG PO TABS
20.0000 mg | ORAL_TABLET | Freq: Two times a day (BID) | ORAL | Status: DC
Start: 2014-08-15 — End: 2014-08-15

## 2014-08-15 MED ORDER — SPIRONOLACTONE 25 MG PO TABS
25.0000 mg | ORAL_TABLET | Freq: Every day | ORAL | Status: DC
Start: 2014-08-15 — End: 2015-05-30

## 2014-08-15 MED ORDER — CARVEDILOL 25 MG PO TABS
25.0000 mg | ORAL_TABLET | Freq: Two times a day (BID) | ORAL | Status: DC
Start: 2014-08-15 — End: 2014-08-15

## 2014-08-15 MED ORDER — LACTATED RINGERS IV SOLN
INTRAVENOUS | Status: DC
Start: 2014-08-15 — End: 2014-08-15
  Administered 2014-08-15: 1000 mL via INTRAVENOUS

## 2014-08-15 MED ORDER — PROMETHAZINE HCL 25 MG/ML IJ SOLN
6.2500 mg | Freq: Once | INTRAMUSCULAR | Status: DC | PRN
Start: 2014-08-15 — End: 2014-08-15

## 2014-08-15 MED ORDER — PROPOFOL INFUSION 10 MG/ML
INTRAVENOUS | Status: DC | PRN
Start: 2014-08-15 — End: 2014-08-15
  Administered 2014-08-15: 50 mg via INTRAVENOUS
  Administered 2014-08-15 (×2): 10 mg via INTRAVENOUS

## 2014-08-15 MED ORDER — FUROSEMIDE 40 MG PO TABS
40.0000 mg | ORAL_TABLET | Freq: Every day | ORAL | Status: DC
Start: 2014-08-15 — End: 2016-02-21

## 2014-08-15 MED ORDER — LIDOCAINE HCL 2 % IJ SOLN
INTRAMUSCULAR | Status: DC | PRN
Start: 2014-08-15 — End: 2014-08-15
  Administered 2014-08-15: 100 mg via INTRAVENOUS

## 2014-08-15 MED ORDER — LACTATED RINGERS IV SOLN
INTRAVENOUS | Status: DC | PRN
Start: 2014-08-15 — End: 2014-08-15

## 2014-08-15 MED ORDER — FAMOTIDINE 20 MG PO TABS
20.0000 mg | ORAL_TABLET | Freq: Two times a day (BID) | ORAL | Status: DC
Start: 2014-08-15 — End: 2015-04-09

## 2014-08-15 MED ORDER — FUROSEMIDE 40 MG PO TABS
40.0000 mg | ORAL_TABLET | Freq: Every day | ORAL | Status: DC
Start: 2014-08-15 — End: 2014-08-15

## 2014-08-15 SURGICAL SUPPLY — 45 items
BASIN EME PLS 700ML LF GRAD TRNLU PGMNT (Patient Supply) ×2
BASIN EMESIS 700 ML GRADUATED TRANSLUCENT PIGMENT FREE PLASTIC (Patient Supply) ×1 IMPLANT
CATHETER BALLOON DILATATION CRE PEBAX (Balloon) IMPLANT
CATHETER OD6 FR ODSEC12-13.5-15 MM L180 CM CREâ„¢ BALLOON DILATATION L8 (Balloon) IMPLANT
CLIP QUICKCLIP2 GI FIXATN LONG (Clip) IMPLANT
CONTAINER HISTOLOGY 60 ML 30 ML GRADUATE LEAK RESISTANT O RING PREFILL (Procedure Accessories) IMPLANT
DILATOR ESCP PEBAX CRE 6FR 12-13.5-15MM (Balloon) IMPLANT
FORCEPS BIOPSY L240 CM +2.8 MM HOT OD2.2 (Endoscopic Supplies)
FORCEPS BIOPSY L240 CM +2.8 MM HOT OD2.2 MM RADIAL JAW (Endoscopic Supplies) IMPLANT
FORCEPS BIOPSY L240 CM JUMBO MICROMESH (Instrument)
FORCEPS BIOPSY L240 CM JUMBO MICROMESH TEETH STREAMLINE CATHETER (Instrument) IMPLANT
FORCEPS BIOPSY L240 CM LARGE CAPACITY (Instrument)
FORCEPS BIOPSY L240 CM MICROMESH TEETH STREAMLINE CATHETER NEEDLE (Instrument) ×1 IMPLANT
FORCEPS BIOPSY L240 CM STANDARD CAPACITY (Instrument) ×1
FORCEPS BX +2.8MM RJ 4 2.2MM 240CM HOT (Endoscopic Supplies)
FORCEPS BX SS JMB RJ 4 2.8MM 240CM STRL (Instrument)
FORCEPS BX SS LG CPC RJ 4 2.4MM 240CM (Instrument)
FORCEPS BX STD CPC RJ 4 2.2MM 240CM STRL (Instrument) ×1
FORCEPS RADIAL JAW 3 W/ NEEDLE (Endoscopic Supplies) IMPLANT
GOWN ISO YELLOW UNIVERSAL (Gown) ×4 IMPLANT
KIT SMARTPREP APC 30 30ML (Kits) IMPLANT
LINER SCT 1500CC THNWL MDVC FLX ADV LF (Suction) IMPLANT
MARKER ENDOSCOPIC PERMANENT INDICATION (Syringes, Needles)
MARKER ENDOSCOPIC PERMANENT INDICATION DARK SYRINGE SPOT EX 5 ML (Syringes, Needles) IMPLANT
MARKER ESCP 5ML SPOT EX PERM INDCT DRK (Syringes, Needles)
NEEDLE INJC DISP NM LOWER GI (Needles) IMPLANT
NET SPEC RTRVL STD RTHNT 2.5MM 230CM LF (Urology Supply)
NET SPECIMEN RETRIEVAL L230 CM STANDARD (Urology Supply)
NET SPECIMEN RETRIEVAL L230 CM STANDARD SHEATH OD2.5 MM L6 CM X W3 CM (Urology Supply) IMPLANT
PAD ELECTROSRG GRND REM W CRD (Procedure Accessories) IMPLANT
SNARE ESCP MED OVL CPTFLX 2.4MM 240CM (Endoscopic Supplies)
SNARE ESCP MIC CPTVTR 13MM 240IN STRL (GE Lab Supplies)
SNARE MD OVAL 240CM 2.4 MM CPTFLX LP FLXBL ENDOSCOPIC POLYPECTOMY 27MM (Endoscopic Supplies) IMPLANT
SNARE MEDIUM OVAL L240 CM OD2.4 MM (Endoscopic Supplies)
SNARE SMALL HEXAGON CAPTIVATOR STIFF ENDOSCOPIC POLYPECTOMY (GE Lab Supplies) IMPLANT
SOL FORMALIN 10% PREFILL 30ML (Procedure Accessories)
SOLN LUBRICATING JELLY 4.25OZ (Irrigation Solutions) ×2 IMPLANT
SPEEDBAND SUPERVIEW SUPER (Endoscopic Supplies) IMPLANT
SPONGE GAUZE L4 IN X W4 IN 4 PLY HIGH (Sponge) ×1
SPONGE GAUZE L4 IN X W4 IN 4 PLY NONWOVEN LINT FREE CURITY RAYON (Sponge) ×1 IMPLANT
SPONGE GZE RYN PLSTR CRTY 4X4IN LF NS 4 (Sponge) ×1
SYRINGE 50 ML GRADUATE NONPYROGENIC DEHP (Syringes, Needles)
SYRINGE 50 ML GRADUATE NONPYROGENIC DEHP FREE PVC FREE BD MEDICAL (Syringes, Needles) IMPLANT
SYRINGE MED 50ML LF STRL GRAD N-PYRG (Syringes, Needles)
TRAP SPECIMEN LF (Procedure Accessories) IMPLANT

## 2014-08-15 NOTE — Anesthesia Preprocedure Evaluation (Addendum)
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

## 2014-08-15 NOTE — PACU (Signed)
Pt going back to original room. Report given to receiving rn. Will continue to monitor closely.

## 2014-08-15 NOTE — Interval H&P Note (Signed)
The risks, benefits and alternatives of the procedure were discussed with the patient and family members .  Their questions were answered to their satisfaction and informed consent was obtained and witnessed. The patient was seen and evaluated and was deemed suitable for the test.

## 2014-08-15 NOTE — H&P (View-Only) (Signed)
GASTROENTEROLOGY CONSULTATION    Date Time: 08/13/2014 5:35 PM  Patient Name: [name],[name]  Attending Physician: Jadoon, Muhammad S, MD  PCP:  Noelle, Holger, MD    Assessment:    Diffuse,episodic abdominal pain of uncertain etiology with prior history of peptic ulcer disease. Recurrent peptic ulcer,gallbladder disease, low-flow state related to cardiomyopathy and low perfusion and lower GI allergies cannot be excluded.   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   Multiple other medical problems including diabetes, hypertension, hyperlipidemia.    Recommendations:    Agree with optimization of cardiac status and treatment of congestive heart failure/pleural effusions   Stool workup for infectious causes of diarrhea including stool for C. Difficile and enteric pathogens.   Switch PPI to Pepcid as the patient has history of diarrhea   [facility] HIDA scan was ordered to further evaluate gallbladder disease as an etiology of his symptoms.    If he has persistent symptoms of recurrent abdominal pain, further evaluation with upper endoscopy would be recommended, after the optimization of her cardiac status.   Obtain hepatitis profile    Problem List:   Principal Problem:    Acute exacerbation of CHF (congestive heart failure)  Active Problems:    Diarrhea    Non-ischemic cardiomyopathy    Periumbilical abdominal pain    PUD (peptic ulcer disease)    Cholelithiasis    Elevated LFTs    Acute dyspnea    Elevated d-dimer    Chest pain on breathing    Acute on chronic congestive heart failure, unspecified congestive heart failure type    CHF (congestive heart failure)    History of Present Illness:   [name] [name] is a 68 y.o. male who presents to the hospital with about 2 months  history of abdominal pain, discomfort and change in his bowel habits. He underwent workup including stool studies and apparently was treated with ciprofloxacin for about 10 days without significant relief in her symptoms. Subsequent CT scan in September of 2015 revealed no acute disease. The patient has prior history of gastric ulcer/duodenal ulcer diagnosed by upper GI series many years ago. He is complaining of episodic abdominal pain involving the mid abdomen and sometimes radiating to the upper abdomen, associated with loose stool and at times diarrhea. He denies any melena or rectal bleeding. He has no fever or chills. He denies any dysphagia or odynophagia. He does complain of some dyspepsia and occasional heartburn symptoms. He denies any hematemesis. He has no cough or sputum production. He denies any urinary complaints. The patient has significant cardiac history with nonischemic cardiomyopathy and ejection fraction of 20%. He does complain of shortness of breath and exertional dyspnea and orthopnea.  Additional information may have been entered in the problem list and other structured lists imported into this document.    Past Medical History:     Past Medical History   Diagnosis Date   . Heart attack 2011     and 2014   . Diabetes mellitus    . Hypertension    . Cardiac defibrillator in situ    . CAD (coronary artery disease)    . Ischemic cardiomyopathy      20%   . CHF (congestive heart failure) 08/12/2014     Past Surgical History:       Past Surgical History   Procedure Laterality Date   . Bring back open heart     . Hernia repair       LIH   . Duodenal ulcer  1973   . Orthopedic surgery       R foot corrective   . Tonsillectomy and adenoidectomy  1954     Family History:     Family History   Problem Relation Age of Onset   . Breast cancer Mother    . Heart attack Mother 61   . Diabetes Brother      Social History:     History     Social History   . Marital Status: Married     Spouse Name: Octavia      Number of Children: 0   . Years of Education: N/A     Occupational History   . 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 Smoking status: Never Smoker    . Smokeless tobacco: Never Used   . Alcohol Use: No   . Drug Use: No   . Sexual Activity: No     Other Topics Concern   . Not on file     Social History Narrative     Allergies:   No Known Allergies  Medications:     Prescriptions prior to admission   Medication Sig   . amlodipine-valsartan (EXFORGE) 10-160 MG per tablet Take 1 tablet by mouth daily.   . aspirin 81 MG chewable tablet Chew 1 tablet (81 mg total) by mouth daily.   . carvedilol (COREG) 6.25 MG tablet Take 6.25 mg by mouth 2 (two) times daily with meals.   . insulin glargine (LANTUS) 100 UNIT/ML injection Inject 45 Units into the skin nightly.   . metFORMIN (GLUCOPHAGE) 500 MG tablet Take 500 mg by mouth 2 (two) times daily with meals.   . NIACIN PO Take 500 mg by mouth daily.      . SitaGLIPtin-MetFORMIN HCl (JANUMET PO) Take by mouth.     Review of Systems:   Psychological: negative for - depression, memory difficulties, mood swings or sleep disturbances  Ophthalmic: negative for - eye pain or loss of vision  ENT: negative for - oral lesions, sore throat or vocal changes  Respiratory:shortness of breath with minimal exertion and laying down.  Cardiovascular: negative for - chest pain, dyspnea on exertion, edema or rapid heart rate  Genito-Urinary: negative for - change in urinary stream, hematuria, incontinence or urinary frequency/urgency  Musculoskeletal: negative for - joint stiffness, joint swelling, joint or back pain  Neurological: negative for - confusion, dizziness, headaches, memory loss, seizures or weakness  Dermatological: negative for rash  Physical Exam:     Filed Vitals:    08/13/14 1413   BP: 114/76   Pulse: 83   Temp: 98.8 F (37.1 C)   Resp: 18   SpO2: 96%     Intake and Output Summary (Last 24 hours) at Date Time    Intake/Output Summary (Last 24 hours) at  08/13/14 1735  Last data filed at 08/13/14 0516   Gross per 24 hour   Intake      0 ml   Output    850 ml   Net   -850 ml     Mental status - alert, oriented to person, place, and time, normal mood, behavior, speech, dress, motor activity, and thought processes  Eyes - pupils equal and reactive, extraocular   eye movements intact  Mouth - mucous membranes moist, pharynx normal without lesions  Neck - supple, no significant adenopathy, mild JVD  Lymphatics - no palpable lymphadenopathy  Chest - clear to auscultation, with the bilateral crackles and decreased breath sound at the bases  Heart - normal rate, regular rhythm, normal S1, S2, no murmurs, rubs, clicks or gallops  Abdomen - soft, obese,nontender, nondistended, no masses or organomegaly. There is no masses no free fluid.  Neurological - alert, oriented, normal speech, no focal findings or movement disorder noted  Musculoskeletal - no joint tenderness, deformity or swelling  Extremities - peripheral pulses normal, no pedal edema, no clubbing or cyanosis  Skin - normal coloration and turgor, no rashes, no suspicious skin lesions noted  Labs:     Recent Labs      08/13/14   0417  08/12/14   1150  08/12/14   0843   WBC  8.25   --   8.27   HEMOGLOBIN  15.0   --   15.5   HEMATOCRIT  45.5   --   46.8   PLATELETS  152   --   143   PT   --   14.4   --    PT INR   --   1.1   --    PTT   --   30   --    ALBUMIN  3.2*  3.7  3.7   CALCIUM  8.7   --   9.0   LIPASE   --    --   21    Recent Labs      08/13/14   0417  08/12/14   1150  08/12/14   0843   SODIUM  141   --   141   POTASSIUM  4.2   --   5.0   CHLORIDE  107   --   108   CO2  23   --   22   BUN  12.8   --   15.2   CREATININE  1.1   --   0.9   GLUCOSE  140*   --   142*   ALBUMIN  3.2*  3.7  3.7   ALT  59*  74*  75*   AST (SGOT)  30  41*  44*   ALKALINE PHOSPHATASE  54  64  64   BILIRUBIN, TOTAL  1.2  1.9*  1.8*               Rads:   Echocardiogram Adult Complete W Clr/ Dopp Waveform    08/13/2014   ECHOCARDIOGRAM   Sonographer:  Stuart Levy  Indications:  Dilated cardiomyopathy  Height (in):  74 Weight (lb):  225 Blood Pressure:  146/107    2-D Measurements  Left Ventricle                                           Diastolic Dimension:  68  (40-56 mm) Systolic Dimension:  62  (25-40 mm)      Septal Diastolic Thickness:  9  (6-11 mm)     Posterior Wall Thickness:  9  (6-11 mm)  Fractional Shortening Percentage:  9%  (24-46 %)                        Visually Estimated Ejection   Fraction:  15-20%  (55-75 %)                           Right Ventricle Diastolic Dimension:  30  (7-26 mm)                             Left Atrium End Systolic Dimension:  44  (19-40 mm)                      Aortic Root:  30  (20-37 mm)                         Doppler Measurements and Color Flow Imaging Valves                                          Aortic Valve:  1.1  (0.9-1.8 m/s).          Regurgitation:       Pulmonic Valve:  0.6  (0.6-0.9 m/s).      Regurgitation:  Mild  Mitral Valve:  0.9  (0.6-1.4 m/s).           Regurgitation:  Moderate  Tricuspid Valve:  0.5  (0.4-0.8 m/s.      Regurgitation:  30  Left Ventricular Outflow Tract Velocity:  0.8 m/s.   E/A Ratio:  0.8 Est. PASP:  37 Est. RA Mean Pressure:  5  Echocardiogram M-mode, 2D, spectral Doppler and color flow imaging were performed and interpreted.     08/13/2014     1.  The quality of the study is technically adequate for interpretation. 2.  The left ventricle is enlarged in size. Left ventricular systolic function is severely reduced. There is global hypokinesis. Estimated EF is 15-20%. There is no left ventricular hypertrophy. There is  evidence of abnormal diastolic LV function, consistent with at least grade 3 diastolic dysfunction 3.  The left atrium is enlarged in size. 4.  The aortic valve is trileaflet. Normal valve excursion is present. There is no significant aortic insufficiency. No aortic stenosis is present. The aortic root is normal in size. 5.  The mitral valve is structurally  normal. Moderate mitral insufficiency is present. 6.  The right ventricle is normal in size and function. 7.  The right atrium is normal in size. 8. The tricuspid valve is structurally normal. There is moderate tricuspid insufficiency. 9. The pulmonic valve is structurally normal. There is no significant pulmonic insufficiency. 10. There is no evidence of pulmonary hypertension. 11. No pericardial effusion, intracardiac thrombi, or masses are seen. 12. The atrial septum is well visualized.   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  Moderate mitral and tricuspid regurgitation.  Grade 3 diastolic dysfunction.   Jeffrey  Luy, MD  08/13/2014 4:46 PM     Chest 2 Views    08/12/2014   Indication: Shortness of breath.  Procedure: PA and lateral views of the chest.  Comparison: Previous exam March 13.  Findings: There is a left-sided defibrillator generator with lead extending into the right atrium, unchanged in position compared with previous exam. There is mild enlargement of the heart shadow, slightly increased from previous exam. Trace by lateral pleural effusions. Mild evidence of interstitial edema   including thickening of the fissures and indistinctness of the pulmonary vasculature.     08/12/2014   Impression: 1. Mildly increased size of the heart shadow consistent with cardiomegaly or pericardial effusion. 2. New pulmonary edema and small bilateral pleural effusions.  Paul  Cunningham, MD  08/12/2014 9:08 AM     Us Abdomen Limited Ruq    08/13/2014   HISTORY: 2 months of upper abdominal pain.  PROCEDURE: Limited sonographic examination of the upper outer pelvis performed using transabdominal real-time grayscale imaging.  FINDINGS: Proximal aorta is normal in caliber. Mid and distal aorta are secured by bowel gas shadowing. Pancreas is obscured by bowel gas shadowing. Included portion of the inferior vena cava is normal in caliber.  Right pleural effusion is present. There is no ascites. The right kidney measures 12.7 cm in length. There is normal cortical medullary differentiation. Multiple renal cysts are present. Largest measures 4.6 cm in diameter. There are no focal liver lesions or abnormally dilated biliary ducts. There is no cholelithiasis, gallbladder wall thickening or pericholecystic fluid. Common bile duct is normal in caliber. No sonographic Murphy's sign was elicited.     08/13/2014    1. Limited study due to bowel gas shadowing as described above. 2. No evidence of cholelithiasis or acute cholecystitis. 3. Right pleural effusion.  Donald  Cerva, MD  08/13/2014 4:01 AM     Us Venous Low Extrem Duplx Dopp Comp Bilat    08/12/2014   INDICATION: Dyspnea. Elevated d-dimer.  PROCEDURE: Ultrasound of the deep venous systems of both lower extremities.  FINDINGS: The deep venous systems of the lower extremities were imaged from the groins to the popliteal fossae. The veins were compressible at all levels. Normal waveforms were seen with normal respiratory variation of blood flow and normal augmentation of blood flow with calf compression. Limited evaluation of the calf veins was also performed.  Visualized calf veins were completely compressible.     08/12/2014    No evidence of lower extremity venous thrombosis.  Paul  Cunningham, MD  08/12/2014 1:14 PM     Ct Abd/pelvis With Po Contrast    08/12/2014   Indication: Abdominal pain.  Procedure: Unenhanced CT of the abdomen and pelvis. Oral contrast material is present. No IV contrast material.  Comparison: Previous CT September 9.  Findings: Small bilateral pleural effusions. Mild cardiomegaly. No pericardial effusion. Small foci of atelectasis are seen in the posterior lung bases. No ascites. Mild edema in the subcutaneous fat of the back and in the intra-abdominal fat. Several large exophytic renal cysts, unchanged from previous scan. No hydronephrosis. No urinary tract calculi.  Sensitivity for some abnormalities is limited without IV contrast material. No focal liver lesions are visible. Unremarkable gallbladder. No dilatation of the biliary tree. Unremarkable spleen, pancreas, adrenals, and abdominal aorta. No dilated bowel loops or bowel wall thickening. No free intraperitoneal gas. Normal appendix. No definite colonic diverticula. Degenerative changes of the spine are seen.     08/12/2014   Impression: 1. Bilateral pleural effusions. 2. Bilateral renal cysts. 3. No acute abnormal findings are seen in the abdomen.  Paul  Cunningham, MD  08/12/2014 12:47 PM     Signed by: Ryu Cerreta A Marykatherine Sherwood, M.D.

## 2014-08-15 NOTE — Plan of Care (Signed)
Problem: Health Promotion  Goal: Knowledge - disease process  Extent of understanding conveyed about a specific disease process.   Outcome: Progressing  Pt alert & verbally responsive. Assessment done, deneis chest pain, no sob. Vital stable at this time. duie emsd given & well tolerated. Needs attended. Call light within reach. Will continue to monitor.

## 2014-08-15 NOTE — Discharge Instructions (Signed)
Home Health Discharge Information    Your doctor has ordered Skilled Nursing in-home service(s) for you while you recuperate at home, to assist you in the transition from hospital to home.      The agency that you or your representative chose to provide the service:  Name of Home Health Agency: [facility] [phone]      The above services were set up by:  Julien Girt  [facility] Liaison) Phone [phone]     Additional comments: If you have not heard from your home health agency within 24-48 hours after discharge please call your agency to arrange a time for your first visit. For any scheduling concerns or questions related to home health, such as time or date please contact your home health agency at the number listed above.

## 2014-08-15 NOTE — Progress Notes (Signed)
Pt feeling wobbly after EGD. Pt is also very SOB. Crackles ausculated. Pt was on LR during procedure. Stopped fluid. Called MD. MD stated to hold . IV lasix ordered.

## 2014-08-15 NOTE — Plan of Care (Incomplete)
Problem: Heart Failure  Goal: Stable vital signs and fluid balance  Outcome: Progressing  Intervention: Weight on admission and record.  .  Intervention: Monitor intake and output every shift  .  Intervention: Monitor and assess vital signs and oxygen saturation.  Patient was 96% on RA while ambulating.  Intervention: Decreased shortness of breath  SOB noted on exertion.     Goal: Mobility/activity is maintained at optimum level for patient  Outcome: Progressing  Intervention: Encourage independent activity per ability.  .  Intervention: Maintain proper body alignment.  Marland Kitchen

## 2014-08-15 NOTE — Discharge Summary (Addendum)
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 Grade 2 diastolic dysfunction. Came in with shortness of breath, abdominal pain surgery be consulted gastroenterology been consulted. So far, does not look like acute cholecystitis,egd done no acute process continue Pepcid bid follow with GI 1-2 weeks.  Patient was admitted with acute systolic congestive heart failure, hypoxia. He was on nasal Oxygenation. He Was Not Able to Lay down Flat. I will discharge him on lasix and aldactone follow with cardiologist in 1 week  -Abdominal pain going on for 2 months surgeon being consulted. Imaging done along with HIDA scan does not look like patient has acute cholecystitis.patient did get EGD also no more pain looks like from sob musculoskeletal   -abnormal LFTs secondary to congestion. Congestive heart failure related.  -. Hypertension, controlled. Continue home medications.  -Dyslipidemia. Statin on hold secondary to elevated LFTs.  -Diabetes mellitus  We will discharge him in stable condition  Discharge Medications:     Medication List      START taking these medications          famotidine 20 MG tablet   Commonly known as:  PEPCID   Take 1 tablet (20 mg total) by mouth 2 (two) times daily.       furosemide 40 MG tablet   Commonly known as:  LASIX   Take 1 tablet (40 mg total) by mouth daily.       spironolactone 25 MG tablet   Commonly known as:  ALDACTONE   Take 1 tablet (25 mg total) by mouth daily.         CHANGE how you take these medications          carvedilol 25 MG tablet   Commonly known as:  COREG   Take 1 tablet (25 mg total)  by mouth 2 (two) times daily with meals.   What changed:    - medication strength  - how much to take         CONTINUE taking these medications          amlodipine-valsartan 10-160 MG per tablet   Commonly known as:  EXFORGE       aspirin 81 MG chewable tablet   Chew 1 tablet (81 mg total) by mouth daily.       insulin glargine 100 UNIT/ML injection   Commonly known as:  LANTUS       JANUMET PO       metFORMIN 500 MG tablet   Commonly known as:  GLUCOPHAGE       NIACIN PO         Where to Get Your Medications     These are the prescriptions that you need to pick up.         You may get the following medications from any pharmacy   -  carvedilol 25 MG tablet   -  famotidine 20 MG tablet   -  furosemide 40 MG tablet   -  spironolactone 25 MG tablet  Hospital Course   Hospital Course (3 Days)     [name] [name] is a 68 y.o. male with the following    Past Medical History   Diagnosis Date   . Heart attack 2011     and 2014   . Diabetes mellitus    . Hypertension    . Cardiac defibrillator in situ    . CAD (coronary artery disease)    . Ischemic cardiomyopathy      20%   . CHF (congestive heart failure) 08/12/2014   . AICD (automatic cardioverter/defibrillator) present 2014       Past Surgical History   Procedure Laterality Date   . Bring back open heart     . Hernia repair       [facility]   . Duodenal ulcer  1973   . Orthopedic surgery       R foot corrective   . Tonsillectomy and adenoidectomy  1954        who presented with .      Procedures/Imaging:   NM Hepatobiliary W CCK   Final Result   CLINICAL HISTORY: Persistent right upper quadrant pain.      FINDINGS: The patient was injected with 6 mCi of Tc 45m Choletec   intravenously. Subsequently, sequential imaging of the abdomen was   performed in the anterior projection, supine position over the course of   60 minutes.      There is normal hepatic uptake of radiotracer with normal excretion into   the biliary system. Tracer is visualized within  the intrahepatic bile   ducts, common hepatic duct, and common bile duct within 15 minutes after   tracer administration. Gallbladder activity progresses throughout the   remainder of the acquisition. There is no small bowel activity at 60   minutes. The clearance of activity from the liver parenchyma is normal.      The patient then received 2.0 mcg of CCK over a slow intravenous   infusion. The patient experienced no abdominal discomfort during CCK   administration. Sequential imaging obtained of the gallbladder over the   course of 60 minutes demonstrates a gallbladder ejection fraction of 13%   which is below normal. Normal is at least 38%. There is small bowel   activity shortly after CCK administration.      IMPRESSION:       1. No scintigraphic evidence for acute cholecystitis.   2. Below normal GBEF of 13%. This may be indicative of biliary   dyskinesia in the appropriate clinical setting.      Collene Schlichter, MD    08/14/2014 2:57 PM         Echocardiogram Adult Complete W Clr/ Dopp Waveform   Final Result   ECHOCARDIOGRAM      Sonographer:  Steele Berg      Indications:  Dilated cardiomyopathy      Height (in):  74   Weight (lb):  225   Blood Pressure:  146/107        2-D Measurements      Left Ventricle                                             Diastolic Dimension:  68  (40-56 mm)   Systolic Dimension:  62  (25-40 mm)        Septal Diastolic Thickness:  9  (6-11 mm)       Posterior Wall Thickness:  9  (6-11 mm)    Fractional Shortening Percentage:  9%  (24-46 %)                          Visually Estimated Ejection Fraction:  15-20%  (55-75 %)                                 Right Ventricle   Diastolic Dimension:  30  (7-26 mm)                                 Left Atrium   End Systolic Dimension:  44  (19-40 mm)                          Aortic Root:  30  (20-37 mm)                             Doppler Measurements and Color Flow Imaging   Valves                                              Aortic Valve:   1.1  (0.9-1.8 m/s).            Regurgitation:           Pulmonic Valve:  0.6  (0.6-0.9 m/s).        Regurgitation:  Mild      Mitral Valve:  0.9  (0.6-1.4 m/s).             Regurgitation:  Moderate      Tricuspid Valve:  0.5  (0.4-0.8 m/s.        Regurgitation:  30      Left Ventricular Outflow Tract Velocity:  0.8 m/s.       E/A Ratio:  0.8   Est. PASP:  37   Est. RA Mean Pressure:  5      Echocardiogram M-mode, 2D, spectral Doppler and color flow imaging were   performed and interpreted.      IMPRESSION:          1.  The quality of the study is technically adequate for interpretation.   2.  The left ventricle is enlarged in size. Left ventricular systolic   function is severely reduced. There is global hypokinesis. Estimated EF   is 15-20%. There is no left ventricular hypertrophy. There is  evidence   of abnormal diastolic LV function, consistent with at least grade 3   diastolic dysfunction   3.  The left atrium is enlarged in size.   4.  The aortic valve is trileaflet. Normal valve excursion is present.   There is no significant aortic insufficiency. No aortic stenosis is   present. The aortic root is normal in size.   5.  The mitral valve is structurally normal. Moderate mitral   insufficiency is present.   6.  The right ventricle is normal in size and function.   7.  The right atrium is normal in size.   8. The tricuspid valve is structurally normal. There is  moderate   tricuspid insufficiency.   9. The pulmonic valve is structurally normal. There is no significant   pulmonic insufficiency.   10. There is no evidence of pulmonary hypertension.   11. No pericardial effusion, intracardiac thrombi, or masses are seen.   12. The atrial septum is well visualized.       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      Moderate mitral and tricuspid regurgitation.      Grade 3 diastolic dysfunction.         Audley Hose, MD     08/13/2014 4:46 PM         US Abdomen Limited Ruq   Final Result   HISTORY: 2 months of upper abdominal pain.      PROCEDURE: Limited sonographic examination of the upper outer pelvis   performed using transabdominal real-time grayscale imaging.      FINDINGS: Proximal aorta is normal in caliber. Mid and distal aorta are   secured by bowel gas shadowing. Pancreas is obscured by bowel gas   shadowing. Included portion of the inferior vena cava is normal in   caliber. Right pleural effusion is present. There is no ascites. The   right kidney measures 12.7 cm in length. There is normal cortical   medullary differentiation. Multiple renal cysts are present. Largest   measures 4.6 cm in diameter. There are no focal liver lesions or   abnormally dilated biliary ducts. There is no cholelithiasis,   gallbladder wall thickening or pericholecystic fluid. Common bile duct   is normal in caliber. No sonographic Murphy's sign was elicited.      IMPRESSION:       1. Limited study due to bowel gas shadowing as described above.   2. No evidence of cholelithiasis or acute cholecystitis.   3. Right pleural effusion.      Olen Pel, MD    08/13/2014 4:01 AM         US Venous Low Extrem Duplx Dopp Comp Bilat   Final Result   INDICATION: Dyspnea. Elevated d-dimer.      PROCEDURE: Ultrasound of the deep venous systems of both lower   extremities.      FINDINGS: The deep venous systems of the lower extremities were imaged   from the groins to the popliteal fossae. The veins were compressible at   all levels. Normal waveforms were seen with normal respiratory variation   of blood flow and normal augmentation of blood flow with calf   compression. Limited evaluation of the calf veins was also performed.    Visualized calf veins were completely compressible.      IMPRESSION:     No evidence of lower extremity venous thrombosis.      Wynema Birch, MD    08/12/2014 1:14 PM         CT Abd/Pelvis with PO Contrast   Final Result    Indication: Abdominal pain.      Procedure: Unenhanced CT of the abdomen and pelvis. Oral contrast   material is present. No IV contrast material.      Comparison: Previous CT September 9.      Findings: Small bilateral pleural effusions. Mild cardiomegaly. No   pericardial effusion. Small foci of atelectasis are seen in the   posterior lung bases. No ascites. Mild edema in the subcutaneous fat of   the back and in  the intra-abdominal fat. Several large exophytic renal   cysts, unchanged from previous scan. No hydronephrosis. No urinary tract   calculi. Sensitivity for some abnormalities is limited without IV   contrast material. No focal liver lesions are visible. Unremarkable   gallbladder. No dilatation of the biliary tree. Unremarkable spleen,   pancreas, adrenals, and abdominal aorta. No dilated bowel loops or bowel   wall thickening. No free intraperitoneal gas. Normal appendix. No   definite colonic diverticula. Degenerative changes of the spine are   seen.      IMPRESSION:    Impression:   1. Bilateral pleural effusions.   2. Bilateral renal cysts.   3. No acute abnormal findings are seen in the abdomen.      Wynema Birch, MD    08/12/2014 12:47 PM         Chest 2 Views   Final Result   Indication: Shortness of breath.      Procedure: PA and lateral views of the chest.      Comparison: Previous exam March 13.      Findings: There is a left-sided defibrillator generator with lead   extending into the right atrium, unchanged in position compared with   previous exam. There is mild enlargement of the heart shadow, slightly   increased from previous exam. Trace by lateral pleural effusions. Mild   evidence of interstitial edema including thickening of the fissures and   indistinctness of the pulmonary vasculature.      IMPRESSION:    Impression:   1. Mildly increased size of the heart shadow consistent with   cardiomegaly or pericardial effusion.   2. New pulmonary edema and small bilateral pleural  effusions.      Wynema Birch, MD    08/12/2014 9:08 AM             Treatment Team:   Attending Provider: Zada Finders, MD  Consulting Physician: Amado Nash, PA  Consulting Physician: Buelah Manis., MD  Consulting Physician: Colon Branch, MD         Progress Note/Physical Exam at Discharge     Subjective:     Filed Vitals:    08/15/14 1349 08/15/14 1525 08/15/14 1711 08/15/14 1720   BP: 111/76  105/62 108/66   Pulse: 76  75 72   Temp: 97.5 F (36.4 C)  97.7 F (36.5 C)    TempSrc: Temporal Artery  Temporal Artery    Resp: 16  15 20    Height:  1.829 m (6')     Weight:  98.431 kg (217 lb)     SpO2: 93%  99% 99%       General: NAD, AAOx3  HEENT: perrla, eomi, sclera anicteric, OP: Clear, MMM  Neck: supple, FROM, no LAD  Cardiovascular: RRR, no m/r/g  Lungs: CTAB, no w/r/r  Abdomen: soft, +BS, NT/ND, no masses, no g/r  Extremities: no C/C/E  Skin: no rashes or lesions noted       Diagnostics     Labs/Studies Pending at Discharge: No        Unresulted Labs     None        Last Labs     Recent Labs  Lab 08/15/14  0730 08/14/14  0454 08/13/14  0417   WBC 7.27 6.99 8.25   RBC 4.76 4.47* 4.90   HEMOGLOBIN 14.4 13.4 15.0   HEMATOCRIT 44.8 41.6* 45.5   MCV 94.1 93.1 92.9   PLATELETS 163 156  152         Recent Labs  Lab 08/15/14  0730 08/14/14  0454 08/13/14  0417 08/13/14  0416 08/12/14  1150 08/12/14  0843   SODIUM 140 139 141  --   --  141   POTASSIUM 4.9 4.1 4.2  --   --  5.0   CHLORIDE 105 103 107  --   --  108   CO2 24 27 23   --   --  22   BUN 14.0 14.0 12.8  --   --  15.2   CREATININE 1.0 1.1 1.1  --   --  0.9   GLUCOSE 145* 139* 140*  --   --  142*   CALCIUM 8.8 8.6 8.7  --   --  9.0   MAGNESIUM  --   --   --  2.1 1.8  --        Echocardiogram Adult Complete W Clr/ Dopp Waveform    08/13/2014   ECHOCARDIOGRAM  Sonographer:  Steele Berg  Indications:  Dilated cardiomyopathy  Height (in):  74 Weight (lb):  225 Blood Pressure:  146/107    2-D Measurements  Left Ventricle                                            Diastolic Dimension:  68  (40-56 mm) Systolic Dimension:  62  (25-40 mm)      Septal Diastolic Thickness:  9  (6-11 mm)     Posterior Wall Thickness:  9  (6-11 mm)  Fractional Shortening Percentage:  9%  (24-46 %)                        Visually Estimated Ejection Fraction:  15-20%  (55-75 %)                           Right Ventricle Diastolic Dimension:  30  (7-26 mm)                             Left Atrium End Systolic Dimension:  44  (19-40 mm)                      Aortic Root:  30  (20-37 mm)                         Doppler Measurements and Color Flow Imaging Valves                                          Aortic Valve:  1.1  (0.9-1.8 m/s).          Regurgitation:       Pulmonic Valve:  0.6  (0.6-0.9 m/s).      Regurgitation:  Mild  Mitral Valve:  0.9  (0.6-1.4 m/s).           Regurgitation:  Moderate  Tricuspid Valve:  0.5  (0.4-0.8 m/s.      Regurgitation:  30  Left Ventricular Outflow Tract Velocity:  0.8 m/s.   E/A Ratio:  0.8 Est. PASP:  37 Est. RA Mean Pressure:  5  Echocardiogram M-mode, 2D, spectral Doppler and color flow imaging were performed and interpreted.     08/13/2014     1.  The quality of the study is technically adequate for interpretation. 2.  The left ventricle is enlarged in size. Left ventricular systolic function is severely reduced. There is global hypokinesis. Estimated EF is 15-20%. There is no left ventricular hypertrophy. There is  evidence of abnormal diastolic LV function, consistent with at least grade 3 diastolic dysfunction 3.  The left atrium is enlarged in size. 4.  The aortic valve is trileaflet. Normal valve excursion is present. There is no significant aortic insufficiency. No aortic stenosis is present. The aortic root is normal in size. 5.  The mitral valve is structurally normal. Moderate mitral insufficiency is present. 6.  The right ventricle is normal in size and function. 7.  The right atrium is normal in size. 8. The tricuspid valve is structurally  normal. There is moderate tricuspid insufficiency. 9. The pulmonic valve is structurally normal. There is no significant pulmonic insufficiency. 10. There is no evidence of pulmonary hypertension. 11. No pericardial effusion, intracardiac thrombi, or masses are seen. 12. The atrial septum is well visualized.   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  Moderate mitral and tricuspid regurgitation.  Grade 3 diastolic dysfunction.   Audley Hose, MD  08/13/2014 4:46 PM     Chest 2 Views    08/12/2014   Indication: Shortness of breath.  Procedure: PA and lateral views of the chest.  Comparison: Previous exam March 13.  Findings: There is a left-sided defibrillator generator with lead extending into the right atrium, unchanged in position compared with previous exam. There is mild enlargement of the heart shadow, slightly increased from previous exam. Trace by lateral pleural effusions. Mild evidence of interstitial edema including thickening of the fissures and indistinctness of the pulmonary vasculature.     08/12/2014   Impression: 1. Mildly increased size of the heart shadow consistent with cardiomegaly or pericardial effusion. 2. New pulmonary edema and small bilateral pleural effusions.  Wynema Birch, MD  08/12/2014 9:08 AM     US Abdomen Limited Ruq    08/13/2014   HISTORY: 2 months of upper abdominal pain.  PROCEDURE: Limited sonographic examination of the upper outer pelvis performed using transabdominal real-time grayscale imaging.  FINDINGS: Proximal aorta is normal in caliber. Mid and distal aorta are secured by bowel gas shadowing. Pancreas is obscured by bowel gas shadowing. Included portion of the inferior vena cava is normal in caliber. Right pleural effusion is present. There is no ascites. The right kidney measures 12.7 cm in length. There is normal cortical medullary differentiation. Multiple renal cysts are present.  Largest measures 4.6 cm in diameter. There are no focal liver lesions or abnormally dilated biliary ducts. There is no cholelithiasis, gallbladder wall thickening or pericholecystic fluid. Common bile duct is normal in caliber. No sonographic Murphy's sign was elicited.     08/13/2014    1. Limited study due to bowel gas shadowing as described above. 2. No evidence of cholelithiasis or acute cholecystitis. 3. Right pleural effusion.  Olen Pel, MD  08/13/2014 4:01 AM     Nm Hepatobiliary W Cck    08/14/2014   CLINICAL HISTORY: Persistent right upper quadrant pain.  FINDINGS: The patient was injected with 6 mCi of Tc 24m Choletec intravenously. Subsequently, sequential imaging of the abdomen was performed in the anterior projection,  supine position over the course of 60 minutes.  There is normal hepatic uptake of radiotracer with normal excretion into the biliary system. Tracer is visualized within the intrahepatic bile ducts, common hepatic duct, and common bile duct within 15 minutes after tracer administration. Gallbladder activity progresses throughout the remainder of the acquisition. There is no small bowel activity at 60 minutes. The clearance of activity from the liver parenchyma is normal.  The patient then received 2.0 mcg of CCK over a slow intravenous infusion. The patient experienced no abdominal discomfort during CCK administration. Sequential imaging obtained of the gallbladder over the course of 60 minutes demonstrates a gallbladder ejection fraction of 13% which is below normal. Normal is at least 38%. There is small bowel activity shortly after CCK administration.     08/14/2014    1. No scintigraphic evidence for acute cholecystitis. 2. Below normal GBEF of 13%. This may be indicative of biliary dyskinesia in the appropriate clinical setting.  Collene Schlichter, MD  08/14/2014 2:57 PM     US Venous Low Extrem Duplx Dopp Comp Bilat    08/12/2014   INDICATION: Dyspnea. Elevated d-dimer.   PROCEDURE: Ultrasound of the deep venous systems of both lower extremities.  FINDINGS: The deep venous systems of the lower extremities were imaged from the groins to the popliteal fossae. The veins were compressible at all levels. Normal waveforms were seen with normal respiratory variation of blood flow and normal augmentation of blood flow with calf compression. Limited evaluation of the calf veins was also performed.  Visualized calf veins were completely compressible.     08/12/2014    No evidence of lower extremity venous thrombosis.  Wynema Birch, MD  08/12/2014 1:14 PM     Ct Abd/pelvis With Po Contrast    08/12/2014   Indication: Abdominal pain.  Procedure: Unenhanced CT of the abdomen and pelvis. Oral contrast material is present. No IV contrast material.  Comparison: Previous CT September 9.  Findings: Small bilateral pleural effusions. Mild cardiomegaly. No pericardial effusion. Small foci of atelectasis are seen in the posterior lung bases. No ascites. Mild edema in the subcutaneous fat of the back and in the intra-abdominal fat. Several large exophytic renal cysts, unchanged from previous scan. No hydronephrosis. No urinary tract calculi. Sensitivity for some abnormalities is limited without IV contrast material. No focal liver lesions are visible. Unremarkable gallbladder. No dilatation of the biliary tree. Unremarkable spleen, pancreas, adrenals, and abdominal aorta. No dilated bowel loops or bowel wall thickening. No free intraperitoneal gas. Normal appendix. No definite colonic diverticula. Degenerative changes of the spine are seen.     08/12/2014   Impression: 1. Bilateral pleural effusions. 2. Bilateral renal cysts. 3. No acute abnormal findings are seen in the abdomen.  Wynema Birch, MD  08/12/2014 12:47 PM        Patient Instructions   Discharge Diet: cardiac diet  Discharge Activity:  activity as tolerated    Follow Up Appointment:  Follow-up Information     Follow up with [facility]. Call in 4 weeks.    Contact information:    [address]  [place] [address]  [phone]        Follow up with Zorita Pang, MD.    Specialty:  Family Medicine    Contact information:    [phone]  [address]  [place] [address]  [phone]          Follow up with Zorita Pang, MD. Schedule  an appointment as soon as possible for a visit in 3 days.    Specialty:  Family Medicine    Contact information:    [phone]  [address]  [place] [address]  [phone]          Follow up with Encarnacion Slates, MD. Schedule an appointment as soon as possible for a visit in 1 week.    Specialty:  Cardiology    Contact information:    [address]  [address]  [place] [address]  [phone]             Time spent examining patient, discussing with patient/family regarding hospital course, chart review, reconciling medications and discharge planning: 40 minutes.    Signed,  Euretha Najarro  11:00 AM 08/16/2014     Cc: PCP and all consultants on listed above

## 2014-08-15 NOTE — Transfer of Care (Signed)
Anesthesia Transfer of Care Note    Patient: [name] [name]    Procedures performed: Procedure(s) with comments:  EGD - egd w/ bx    Anesthesia type: General TIVA    Patient location:Phase I PACU    Last vitals:   Filed Vitals:    08/15/14 1711   BP: 105/62   Pulse: 75   Temp: 36.5 C (97.7 F)   Resp: 15   SpO2: 99%       Post pain: Patient not complaining of pain, continue current therapy      Mental Status:sedated    Respiratory Function: tolerating nasal cannula    Cardiovascular: 5 mg of ephedrine given in PACU for a pressure of 90/57    Nausea/Vomiting: patient not complaining of nausea or vomiting    Hydration Status: adequate    Post assessment: no apparent anesthetic complications

## 2014-08-15 NOTE — Anesthesia Postprocedure Evaluation (Signed)
At the conclusion of the procedure, the patient was uneventfully transported to the PACU in stable condition with good ventilatory exchange. Uneventful transition to PACU care.    At this time the patient is awake/easily arousable. The patient's respirations, and cardiovascular status have been evaluated and deemed stable. Post op nausea, vomiting and pain are being evaluated, treated and controlled as effectively as possible without compromising the patients respiratory and cardiovascular status.    Review of input and output information, assessment of cardiovascular course and current physical findings are consistent with adequate hydrational support. Please refer to PACU nursing documentation for confirmation of attainment of normothermia.    There were no anesthetic-related complications evident at this time.    The patient is recovering well and a smooth transition to the next phase of care is anticipated.

## 2014-08-15 NOTE — Brief Op Note (Signed)
FINDINGS:   Esophagitis   Antral Erosions   No Active ulcer disease   No Gastric outlet obstruction  Recommendations:   Avoid NSAIDs   F/U Bx results   Cont. Pepcid   F/U GI as outpatient.

## 2014-08-15 NOTE — Plan of Care (Signed)
Problem: Heart Failure  Goal: Stable vital signs and fluid balance  Outcome: Progressing  .  Intervention: Weight on admission and record.  .  Intervention: Monitor intake and output every shift  .  Intervention: Monitor and assess vital signs and oxygen saturation.  Pt o2 is 96% on RA  Intervention: Decreased shortness of breath  Slight SOB noted on exertion.     Goal: Mobility/activity is maintained at optimum level for patient  Outcome: Progressing  .  Goal: Nutritional Intake is Adequate  Outcome: Progressing  .  Intervention: Assist patient with eating.  Marland Kitchen

## 2014-08-15 NOTE — Consults (Signed)
Called [facility] and left a msg to make sure we have a Home Health Agency prior to his d/c possibly today if his EGD is negative.     CM will cont to f/u.

## 2014-08-15 NOTE — Progress Notes (Signed)
Pt o2 sat is 96% while ambulating on RA.

## 2014-08-16 ENCOUNTER — Encounter: Payer: Self-pay | Admitting: Gastroenterology

## 2014-08-16 LAB — BASIC METABOLIC PANEL
Anion Gap: 9 (ref 5.0–15.0)
BUN: 15.6 mg/dL (ref 9.0–28.0)
CO2: 24 mEq/L (ref 22–29)
Calcium: 8.9 mg/dL (ref 8.5–10.5)
Chloride: 104 mEq/L (ref 100–111)
Creatinine: 1.1 mg/dL (ref 0.7–1.3)
Glucose: 164 mg/dL — ABNORMAL HIGH (ref 70–100)
Potassium: 4.5 mEq/L (ref 3.5–5.1)
Sodium: 137 mEq/L (ref 136–145)

## 2014-08-16 LAB — ECG 12-LEAD
Atrial Rate: 76 {beats}/min
Atrial Rate: 71 {beats}/min
P Axis: 67 degrees
P Axis: 77 degrees
P-R Interval: 144 ms
P-R Interval: 146 ms
Q-T Interval: 418 ms
Q-T Interval: 422 ms
QRS Duration: 88 ms
QRS Duration: 90 ms
QTC Calculation (Bezet): 470 ms
QTC Calculation (Bezet): 458 ms
R Axis: 16 degrees
R Axis: 13 degrees
T Axis: 150 degrees
T Axis: 118 degrees
Ventricular Rate: 76 {beats}/min
Ventricular Rate: 71 {beats}/min

## 2014-08-16 LAB — GLUCOSE WHOLE BLOOD - POCT
Whole Blood Glucose POCT: 161 mg/dL — ABNORMAL HIGH (ref 70–100)
Whole Blood Glucose POCT: 190 mg/dL — ABNORMAL HIGH (ref 70–100)

## 2014-08-16 LAB — MAGNESIUM: Magnesium: 2.6 mg/dL (ref 1.6–2.6)

## 2014-08-16 LAB — GFR: EGFR: >60

## 2014-08-16 MED ORDER — BUTALBITAL-APAP-CAFFEINE 50-325-40 MG PO TABS
1.0000 | ORAL_TABLET | ORAL | Status: DC | PRN
Start: 2014-08-16 — End: 2016-10-01

## 2014-08-16 NOTE — Progress Notes (Addendum)
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  Grade 2 diastolic dysfunction.  Came in with shortness of breath, abdominal pain surgery be consulted gastroenterology been consulted.  So far, does not look like acute cholecystitis, possible EGD today evening    MEDICATIONS     Current Facility-Administered Medications   Medication Dose Route Frequency   . amLODIPine  10 mg Oral Daily   . aspirin  81 mg Oral Daily   . carvedilol  25 mg Oral BID Meals   . famotidine  20 mg Intravenous Q12H SCH   . insulin glargine  30 Units Subcutaneous QHS   . spironolactone  25 mg Oral Daily   . valsartan  160 mg Oral Daily          ROS   No abdominal pain no nausea vomiting, no diarrhea   No chest pain no palpitation  No fever no chills  No dysuria no hematuria  No dizziness no lightheadedness   No hallucinations or delusions   Shortness of breath improved.  No  PHYSICAL EXAM     Objective:  I/O last 3 completed shifts:  In: 1262 [P.O.:762; I.V.:500]  Out: 600 [Urine:600]   Patient reviewed by myself  Filed Vitals:    08/16/14 0107 08/16/14 0522 08/16/14 0735 08/16/14 0914   BP: 107/67 133/95  110/79   Pulse: 65 70  82   Temp: 98.1 F (36.7 C) 98.1 F (36.7 C)  96.1 F (35.6 C)   TempSrc: Temporal Artery Temporal Artery  Temporal Artery   Resp: 19 20  20    Height:       Weight:       SpO2: 99% 98% 95% 96%       Exam: Patient is awake, alert and oriented x 3, in no acute distress  HEENT: PERRLA, EOMI, Oral cavity well hydrated.  Neck: supple, without JVD  Chest: Mild basal crackles, no rhonchi  CVS: S1, S2 normal, no rubs, no murmurs, no gallops.  Regular rate and rhythm.  Abdomen: soft and non tender with good bowel sounds.  Musculoskeletal: No pitting edema, no  cyanosis/clubbing  NEURO:  No Focal neurological deficits      LABS   Patient labs reviewed by myself     Recent Labs  Lab 08/15/14  0730 08/14/14  0454 08/13/14  0417 08/12/14  0843   WBC 7.27 6.99 8.25 8.27   RBC 4.76 4.47* 4.90 5.02   HEMOGLOBIN 14.4 13.4 15.0 15.5   HEMATOCRIT 44.8 41.6* 45.5 46.8   MCV 94.1 93.1 92.9 93.2   PLATELETS 163 156 152 143       Recent Labs  Lab 08/16/14  0615 08/15/14  0730 08/14/14  0454 08/13/14  0417 08/13/14  0416 08/12/14  1150 08/12/14  0843   SODIUM 137 140 139 141  --   --  141   POTASSIUM 4.5 4.9 4.1 4.2  --   --  5.0   CHLORIDE 104 105 103 107  --   --  108   CO2 24 24 27 23   --   --  22   BUN 15.6 14.0 14.0 12.8  --   --  15.2   CREATININE 1.1 1.0 1.1 1.1  --   --  0.9   GLUCOSE 164* 145* 139* 140*  --   --  142*   CALCIUM 8.9 8.8 8.6 8.7  --   --  9.0   MAGNESIUM 2.6  --   --   --  2.1 1.8  --      No results for input(s): OSMOLUR, NAUR in the last 168 hours.    Invalid input(s): OSMOL    Recent Labs  Lab 08/15/14  0730 08/14/14  0454 08/13/14  0417 08/12/14  1150   ALT 37 40 59* 74*   AST (SGOT) 23 17 30  41*   BILIRUBIN, TOTAL 1.0 1.0 1.2 1.9*   BILIRUBIN, DIRECT  --   --   --  0.6*   ALBUMIN 3.3* 3.0* 3.2* 3.7   ALKALINE PHOSPHATASE 61 57 54 64       Recent Labs  Lab 08/12/14  2014 08/12/14  1417 08/12/14  1004   TROPONIN I 0.04 0.04 0.04       Recent Labs  Lab 08/12/14  1150   PT INR 1.1   PT 14.4   PTT 30     161  Invalid input(s): BLOODCULTURE      RADIOLOGY   Patient radiological tests reviewed by myself  Radiological Procedure reviewed.  NM Hepatobiliary W CCK   Final Result   CLINICAL HISTORY: Persistent right upper quadrant pain.      FINDINGS: The patient was injected with 6 mCi of Tc 49m Choletec   intravenously. Subsequently, sequential imaging of the abdomen was   performed in the anterior projection, supine position over the course of   60 minutes.      There is normal hepatic uptake of radiotracer with normal excretion into   the biliary system. Tracer is  visualized within the intrahepatic bile   ducts, common hepatic duct, and common bile duct within 15 minutes after   tracer administration. Gallbladder activity progresses throughout the   remainder of the acquisition. There is no small bowel activity at 60   minutes. The clearance of activity from the liver parenchyma is normal.      The patient then received 2.0 mcg of CCK over a slow intravenous   infusion. The patient experienced no abdominal discomfort during CCK   administration. Sequential imaging obtained of the gallbladder over the   course of 60 minutes demonstrates a gallbladder ejection fraction of 13%   which is below normal. Normal is at least 38%. There is small bowel   activity shortly after CCK administration.      IMPRESSION:       1. No scintigraphic evidence for acute cholecystitis.   2. Below normal GBEF of 13%. This may be indicative of biliary   dyskinesia in the appropriate clinical setting.      Collene Schlichter, MD    08/14/2014 2:57 PM         Echocardiogram Adult Complete W Clr/ Dopp Waveform   Final Result   ECHOCARDIOGRAM      Sonographer:  Steele Berg      Indications:  Dilated cardiomyopathy      Height (in):  74   Weight (lb):  225   Blood Pressure:  146/107        2-D Measurements      Left Ventricle  Diastolic Dimension:  68  (40-56 mm)   Systolic Dimension:  62  (25-40 mm)        Septal Diastolic Thickness:  9  (6-11 mm)       Posterior Wall Thickness:  9  (6-11 mm)    Fractional Shortening Percentage:  9%  (24-46 %)                          Visually Estimated Ejection Fraction:  15-20%  (55-75 %)                                 Right Ventricle   Diastolic Dimension:  30  (7-26 mm)                                 Left Atrium   End Systolic Dimension:  44  (19-40 mm)                          Aortic Root:  30  (20-37 mm)                             Doppler Measurements and Color Flow Imaging   Valves                                               Aortic Valve:  1.1  (0.9-1.8 m/s).            Regurgitation:           Pulmonic Valve:  0.6  (0.6-0.9 m/s).        Regurgitation:  Mild      Mitral Valve:  0.9  (0.6-1.4 m/s).             Regurgitation:  Moderate      Tricuspid Valve:  0.5  (0.4-0.8 m/s.        Regurgitation:  30      Left Ventricular Outflow Tract Velocity:  0.8 m/s.       E/A Ratio:  0.8   Est. PASP:  37   Est. RA Mean Pressure:  5      Echocardiogram M-mode, 2D, spectral Doppler and color flow imaging were   performed and interpreted.      IMPRESSION:          1.  The quality of the study is technically adequate for interpretation.   2.  The left ventricle is enlarged in size. Left ventricular systolic   function is severely reduced. There is global hypokinesis. Estimated EF   is 15-20%. There is no left ventricular hypertrophy. There is  evidence   of abnormal diastolic LV function, consistent with at least grade 3   diastolic dysfunction   3.  The left atrium is enlarged in size.   4.  The aortic valve is trileaflet. Normal valve excursion is present.   There is no significant aortic insufficiency. No aortic stenosis is   present. The aortic root is normal in size.   5.  The mitral valve is structurally normal. Moderate mitral   insufficiency is present.   6.  The right ventricle is  normal in size and function.   7.  The right atrium is normal in size.   8. The tricuspid valve is structurally normal. There is moderate   tricuspid insufficiency.   9. The pulmonic valve is structurally normal. There is no significant   pulmonic insufficiency.   10. There is no evidence of pulmonary hypertension.   11. No pericardial effusion, intracardiac thrombi, or masses are seen.   12. The atrial septum is well visualized.       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      Moderate mitral and tricuspid regurgitation.      Grade 3 diastolic dysfunction.         Audley Hose, MD    08/13/2014 4:46 PM         US Abdomen Limited Ruq   Final Result   HISTORY: 2 months of upper abdominal pain.      PROCEDURE: Limited sonographic examination of the upper outer pelvis   performed using transabdominal real-time grayscale imaging.      FINDINGS: Proximal aorta is normal in caliber. Mid and distal aorta are   secured by bowel gas shadowing. Pancreas is obscured by bowel gas   shadowing. Included portion of the inferior vena cava is normal in   caliber. Right pleural effusion is present. There is no ascites. The   right kidney measures 12.7 cm in length. There is normal cortical   medullary differentiation. Multiple renal cysts are present. Largest   measures 4.6 cm in diameter. There are no focal liver lesions or   abnormally dilated biliary ducts. There is no cholelithiasis,   gallbladder wall thickening or pericholecystic fluid. Common bile duct   is normal in caliber. No sonographic Murphy's sign was elicited.      IMPRESSION:       1. Limited study due to bowel gas shadowing as described above.   2. No evidence of cholelithiasis or acute cholecystitis.   3. Right pleural effusion.      Olen Pel, MD    08/13/2014 4:01 AM         US Venous Low Extrem Duplx Dopp Comp Bilat   Final Result   INDICATION: Dyspnea. Elevated d-dimer.      PROCEDURE: Ultrasound of the deep venous systems of both lower   extremities.      FINDINGS: The deep venous systems of the lower extremities were imaged   from the groins to the popliteal fossae. The veins were compressible at   all levels. Normal waveforms were seen with normal respiratory variation   of blood flow and normal augmentation of blood flow with calf   compression. Limited evaluation of the calf veins was also performed.    Visualized calf veins were completely compressible.      IMPRESSION:     No evidence of lower extremity venous thrombosis.      Wynema Birch, MD    08/12/2014 1:14 PM         CT Abd/Pelvis with PO Contrast   Final  Result   Indication: Abdominal pain.      Procedure: Unenhanced CT of the abdomen and pelvis. Oral contrast   material is present. No IV contrast material.      Comparison: Previous CT September 9.      Findings: Small bilateral pleural effusions. Mild cardiomegaly. No   pericardial effusion. Small  foci of atelectasis are seen in the   posterior lung bases. No ascites. Mild edema in the subcutaneous fat of   the back and in the intra-abdominal fat. Several large exophytic renal   cysts, unchanged from previous scan. No hydronephrosis. No urinary tract   calculi. Sensitivity for some abnormalities is limited without IV   contrast material. No focal liver lesions are visible. Unremarkable   gallbladder. No dilatation of the biliary tree. Unremarkable spleen,   pancreas, adrenals, and abdominal aorta. No dilated bowel loops or bowel   wall thickening. No free intraperitoneal gas. Normal appendix. No   definite colonic diverticula. Degenerative changes of the spine are   seen.      IMPRESSION:    Impression:   1. Bilateral pleural effusions.   2. Bilateral renal cysts.   3. No acute abnormal findings are seen in the abdomen.      Wynema Birch, MD    08/12/2014 12:47 PM         Chest 2 Views   Final Result   Indication: Shortness of breath.      Procedure: PA and lateral views of the chest.      Comparison: Previous exam March 13.      Findings: There is a left-sided defibrillator generator with lead   extending into the right atrium, unchanged in position compared with   previous exam. There is mild enlargement of the heart shadow, slightly   increased from previous exam. Trace by lateral pleural effusions. Mild   evidence of interstitial edema including thickening of the fissures and   indistinctness of the pulmonary vasculature.      IMPRESSION:    Impression:   1. Mildly increased size of the heart shadow consistent with   cardiomegaly or pericardial effusion.   2. New pulmonary edema and small bilateral pleural  effusions.      Wynema Birch, MD    08/12/2014 9:08 AM               Patient Active Problem List    Diagnosis Date Noted   . CHF (congestive heart failure) 08/13/2014   . Diarrhea 08/12/2014   . 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 Acute exacerbation of CHF (congestive heart failure) 08/12/2014   . Periumbilical abdominal pain 08/12/2014   . PUD (peptic ulcer disease) 08/12/2014   . Cholelithiasis 08/12/2014   . Elevated LFTs 08/12/2014   . Acute dyspnea 08/12/2014   . Elevated d-dimer 08/12/2014   . Chest pain on breathing 08/12/2014   . Acute on chronic congestive heart failure, unspecified congestive heart failure type    . Diabetes mellitus    . Heart attack      and 2014     . CAD (coronary artery disease)    . Hypertension    . Chest pain 12/29/2013       Assessment and Plan:       Patient was admitted with acute systolic congestive heart failure, hypoxia.  He was on nasal Oxygenation.  He was not able to lay down Flat.  Today, Patient Is off Oxygen.  He Can Lay down Flat without Any Problem.  Looks like patient will need oral Lasix 40 mg upon discharge.  Once he is done with his EGD.  -Abdominal pain going on for 2 months surgeon being consulted.  Imaging done along with HIDA scan does not look like patient has acute cholecystitis.  Patient  will go for EGD today.  Nothing by mouth  -Abnormal LFTs secondary to congestion.  Congestive heart failure related.  -.  Hypertension, controlled.  Continue home medications.  -Dyslipidemia.  Statin on hold secondary to elevated LFTs.  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

## 2014-08-16 NOTE — Plan of Care (Signed)
[id number] Program    Education about New Medications and their Side effects    Dear [name] [name],    Its been a pleasure taking care of you during your hospitalization here at [facility]. We have initiated a new program to educate our patients and/or their family members or designated personnel about the new medications started by your physicians and their indications along with the possible side effects. Multiple studies have shown that patients started on new medications are often unaware of the names of the medication along with the indications and their side effects which leads to decreased compliance with the medications.    During our conversation today on 08/16/2014  1:02 PM I have explained to you the name of the new medication and the indication along with some possible common side effects. Listed below are some of the new medications started during this hospitalization.     Please call the Nurse if you have any side effects while in hospital.     Please call [phone] if you have any life threatening symptoms after you are discharged from the hospital.    Please inform your Primary care physician for common side effects which are not life threatening after discharge.    Medication Name: Spironolactone(Aldactone)   This Medication is used for:   Excess fluid   Heart failure   High blood pressure  Common Side Effects are:   Increased potassium  A note from your nurse:  Call your nurse immediately if you notice itching, hives, swelling or trouble breathing     Medication Name: Furosemide(Lasix)   This Medication is used for:   Congestive Heart Failure   Excess Fluid     Common Side Effects are:   Dizziness/Low Blood Pressure(especially when standing up)    Frequent Urination   Low Potassium Levels   Muscle Cramps   Dehydration     A note from your nurse:  Call your nurse immediately if you notice itching, hives, swelling or trouble breathing         Medication Name: Carvedilol(Coreg)    This Medication is used for:   High Blood Pressure   Congestive Heart Failure   Heart attack   Atrial Fibrillation(irregular heart beat)  Common Side Effects are:   Dizziness/Low Blood Pressure    High Blood Sugar    Worsening of Shortness of Breath with Asthma or COPD   Fatigue    Change in Heart Rhythm (Heart Block)  A note from your nurse:  Call your nurse immediately if you notice itching, hives, swelling or trouble breathing     Medication Name: Famotidine(pepcid)   This Medication is used for:   Reduce stomach acidity   Reflux disease(GERD)   Peptic ulcer disease    Common Side Effects are:   Dizziness   Headache   Constipation    A note from your nurse:  Call your nurse immediately if you notice itching, hives, swelling or trouble breathing       Thank you for your time.    Myrna Blazer, RN  08/16/2014  1:02 PM  [facility] [place] [facility]  [address]  [place], [place]  [address]

## 2014-08-16 NOTE — Plan of Care (Signed)
Problem: Safety  Goal: Patient will be free from injury during hospitalization  Outcome: Progressing  Patient is alert and oriented. Ambulatory and independent. Denies any c/p, SOB or abdominal pain. Demonstrates good safety awareness. No distress noted. Hourly rounding ongoing. Will continue to monitor. Call bell within reach.    Problem: Heart Failure  Goal: Stable vital signs and fluid balance  Outcome: Progressing  VSS, patient denies SOB, breathing unlabored. 95%on RA at rest. Still being diuresed with IV lasix. Will continue to monitor urine output.

## 2014-08-16 NOTE — Progress Notes (Signed)
[employer] Jude single chamber ICD was interrogated today for routine follow up as pt missed his apt due to hospitalization.      Interrogation showed NL function, no events, slightly higher RV/ICD lead threshold, however, no programming changes where required.  Good battery.      Noted plans for D/C per Dr Alinda Sierras.  We will arrange hospital follow up in our office in 7-10 days.    [name] Nash, PA  [phone]

## 2014-08-16 NOTE — Plan of Care (Signed)
Problem: Pain  Goal: Patient's pain/discomfort is manageable  Outcome: Progressing  Problem: Pain   Goal: Patient's pain/discomfort is manageable   Outcome: Progressing   Pt. Alert and oriented x 3. Pain management at a tolerable level.

## 2014-08-16 NOTE — Discharge Summary -  Nursing (Signed)
Patient discharged to home via wheelchair. Discussed d/c instructions with patient and wife, verbalized understanding. Rx given to patient.

## 2014-09-10 LAB — GLUCOSE WHOLE BLOOD - POCT: Whole Blood Glucose POCT: 108 mg/dL — ABNORMAL HIGH (ref 70–100)

## 2015-02-23 ENCOUNTER — Other Ambulatory Visit (INDEPENDENT_AMBULATORY_CARE_PROVIDER_SITE_OTHER): Payer: Self-pay | Admitting: Family Medicine

## 2015-03-14 ENCOUNTER — Other Ambulatory Visit (INDEPENDENT_AMBULATORY_CARE_PROVIDER_SITE_OTHER): Payer: Self-pay | Admitting: Family Medicine

## 2015-03-14 NOTE — Telephone Encounter (Signed)
Patient entered [name] [name] requesting refill. Call placed to Dr. Altamease Oiler in [name].  Received verbal authorization from Dr. Altamease Oiler to call in this medication as written above.  Rx called into [facility] (pharmacist) at [facility] [phone]. Pt aware of same.

## 2015-03-14 NOTE — Telephone Encounter (Signed)
Sign only Dr. Altamease Oiler, thanks.

## 2015-04-05 ENCOUNTER — Encounter (INDEPENDENT_AMBULATORY_CARE_PROVIDER_SITE_OTHER): Payer: Self-pay | Admitting: Family Medicine

## 2015-04-09 ENCOUNTER — Other Ambulatory Visit (INDEPENDENT_AMBULATORY_CARE_PROVIDER_SITE_OTHER): Payer: Self-pay | Admitting: Family Medicine

## 2015-05-08 ENCOUNTER — Other Ambulatory Visit (INDEPENDENT_AMBULATORY_CARE_PROVIDER_SITE_OTHER): Payer: Self-pay | Admitting: Family Medicine

## 2015-05-29 ENCOUNTER — Other Ambulatory Visit (INDEPENDENT_AMBULATORY_CARE_PROVIDER_SITE_OTHER): Payer: Self-pay | Admitting: Family Medicine

## 2015-05-29 NOTE — Telephone Encounter (Signed)
lv 01/14/15

## 2015-05-30 MED ORDER — AMLODIPINE BESYLATE-VALSARTAN 10-320 MG PO TABS
1.0000 | ORAL_TABLET | Freq: Every day | ORAL | Status: DC
Start: 2015-05-30 — End: 2016-02-21

## 2015-06-03 ENCOUNTER — Other Ambulatory Visit (INDEPENDENT_AMBULATORY_CARE_PROVIDER_SITE_OTHER): Payer: Self-pay

## 2015-06-03 MED ORDER — SPIRONOLACTONE 25 MG PO TABS
25.0000 mg | ORAL_TABLET | Freq: Every day | ORAL | Status: DC
Start: 2015-06-03 — End: 2016-04-11

## 2015-06-03 NOTE — Telephone Encounter (Signed)
lv 01/14/15

## 2015-06-05 ENCOUNTER — Encounter (INDEPENDENT_AMBULATORY_CARE_PROVIDER_SITE_OTHER): Payer: Commercial Managed Care - POS | Admitting: Family Medicine

## 2015-06-06 ENCOUNTER — Other Ambulatory Visit (INDEPENDENT_AMBULATORY_CARE_PROVIDER_SITE_OTHER): Payer: Self-pay | Admitting: Family Medicine

## 2015-06-21 ENCOUNTER — Other Ambulatory Visit (INDEPENDENT_AMBULATORY_CARE_PROVIDER_SITE_OTHER): Payer: Self-pay | Admitting: Family Medicine

## 2015-07-15 ENCOUNTER — Other Ambulatory Visit (INDEPENDENT_AMBULATORY_CARE_PROVIDER_SITE_OTHER): Payer: Self-pay | Admitting: Family Medicine

## 2015-07-21 ENCOUNTER — Other Ambulatory Visit (INDEPENDENT_AMBULATORY_CARE_PROVIDER_SITE_OTHER): Payer: Self-pay | Admitting: Family Medicine

## 2015-07-29 ENCOUNTER — Ambulatory Visit (INDEPENDENT_AMBULATORY_CARE_PROVIDER_SITE_OTHER): Payer: Commercial Managed Care - POS | Admitting: Family

## 2015-07-29 ENCOUNTER — Encounter (INDEPENDENT_AMBULATORY_CARE_PROVIDER_SITE_OTHER): Payer: Self-pay

## 2015-07-29 VITALS — BP 146/91 | HR 64 | Temp 97.4°F | Resp 16 | Ht 72.0 in | Wt 287.0 lb

## 2015-07-29 DIAGNOSIS — R51 Headache: Secondary | ICD-10-CM

## 2015-07-29 DIAGNOSIS — Z794 Long term (current) use of insulin: Secondary | ICD-10-CM

## 2015-07-29 DIAGNOSIS — E118 Type 2 diabetes mellitus with unspecified complications: Secondary | ICD-10-CM

## 2015-07-29 DIAGNOSIS — R519 Headache, unspecified: Secondary | ICD-10-CM

## 2015-07-29 LAB — UC POCT URINALYSIS DIPSTIX (10) (MULTI-TEST)
Bilirubin, UA POCT: NEGATIVE
Blood, UA POCT: NEGATIVE
Glucose, UA POCT: 100 mg/dL — AB
Ketones, UA POCT: NEGATIVE mg/dL
Nitrite, UA POCT: NEGATIVE
POCT Leukocytes, UA: NEGATIVE
POCT Spec Gravity, UA: 1.025 (ref 1.001–1.035)
POCT pH, UA: 6 (ref 5–8)
Urobilinogen, UA: 2 mg/dL

## 2015-07-29 LAB — POCT GLUCOSE: Whole Blood Glucose POCT: 182 mg/dL — AB (ref 70–100)

## 2015-07-29 NOTE — Progress Notes (Signed)
Quick Note:    Reviewed with pt.  ______
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
## 2015-08-07 ENCOUNTER — Ambulatory Visit (INDEPENDENT_AMBULATORY_CARE_PROVIDER_SITE_OTHER): Payer: Commercial Managed Care - POS | Admitting: Family Medicine

## 2015-08-07 VITALS — BP 154/73 | HR 69 | Temp 97.9°F | Ht 72.0 in | Wt 220.6 lb

## 2015-08-07 DIAGNOSIS — I25700 Atherosclerosis of coronary artery bypass graft(s), unspecified, with unstable angina pectoris: Secondary | ICD-10-CM

## 2015-08-07 DIAGNOSIS — Z23 Encounter for immunization: Secondary | ICD-10-CM

## 2015-08-07 DIAGNOSIS — S0990XS Unspecified injury of head, sequela: Secondary | ICD-10-CM

## 2015-08-07 DIAGNOSIS — I10 Essential (primary) hypertension: Secondary | ICD-10-CM

## 2015-08-07 DIAGNOSIS — G44309 Post-traumatic headache, unspecified, not intractable: Secondary | ICD-10-CM

## 2015-08-07 DIAGNOSIS — E119 Type 2 diabetes mellitus without complications: Secondary | ICD-10-CM

## 2015-08-07 DIAGNOSIS — Z794 Long term (current) use of insulin: Secondary | ICD-10-CM

## 2015-08-07 NOTE — Patient Instructions (Signed)
Check BP from time to time . If always below 140/90, than BP is considered controlled .If above f/u. Concentrate on exercise and diet .    Recommend to continue to push exercise and stay on a good diet . Try to get to the perfect BMI of 25. Start your day with a high fiber diet and  involve the family in your exercise regiment .
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
## 2015-08-07 NOTE — Addendum Note (Signed)
Addended by: Aretta Nip on: 08/07/2015 05:05 PM     Modules accepted: Orders

## 2015-08-08 NOTE — Addendum Note (Signed)
Addended byZorita Pang on: 08/08/2015 12:04 AM     Modules accepted: Orders

## 2015-08-10 ENCOUNTER — Ambulatory Visit (INDEPENDENT_AMBULATORY_CARE_PROVIDER_SITE_OTHER): Payer: Commercial Managed Care - POS

## 2015-08-10 DIAGNOSIS — I10 Essential (primary) hypertension: Secondary | ICD-10-CM

## 2015-08-10 DIAGNOSIS — I25700 Atherosclerosis of coronary artery bypass graft(s), unspecified, with unstable angina pectoris: Secondary | ICD-10-CM

## 2015-08-10 NOTE — Progress Notes (Signed)
Patient presented to the office for FASTING blood work.  Labs completed by me, Benjie Karvonen, RN.  Patient tolerated procedure well and left in good condition.

## 2015-08-11 ENCOUNTER — Other Ambulatory Visit (INDEPENDENT_AMBULATORY_CARE_PROVIDER_SITE_OTHER): Payer: Self-pay | Admitting: Family Medicine

## 2015-08-11 DIAGNOSIS — E7800 Pure hypercholesterolemia, unspecified: Secondary | ICD-10-CM

## 2015-08-11 DIAGNOSIS — R7309 Other abnormal glucose: Secondary | ICD-10-CM

## 2015-08-11 LAB — LIPID PANEL
Cholesterol / HDL Ratio: 4.3 (calc) (ref 0.0–5.0)
Cholesterol: 146 mg/dL (ref 125–200)
HDL: 34 mg/dL — AB (ref >=40)
LDL Calculated: 79 mg/dL (ref <130)
Non HDL Cholesterol (LDL and VLDL): 112 mg/dL
Triglycerides: 167 mg/dL — ABNORMAL HIGH (ref <150)

## 2015-08-11 LAB — COMPREHENSIVE METABOLIC PANEL
ALT: 11 U/L (ref 9–46)
AST (SGOT): 10 U/L (ref 10–35)
Albumin/Globulin Ratio: 1.6 (ref 1.0–2.5)
Albumin: 4.5 G/DL (ref 3.6–5.1)
Alkaline Phosphatase: 78 U/L (ref 40–115)
BUN: 15 MG/DL (ref 7–25)
Bilirubin, Total: 0.7 MG/DL (ref 0.2–1.2)
CO2: 30 mmol/L (ref 20–31)
Calcium: 9.4 MG/DL (ref 8.6–10.3)
Chloride: 99 mmol/L (ref 98–110)
Creatinine: 1.1 mg/dL (ref 0.70–1.25)
EGFR African American: 80 mL/min/{1.73_m2} (ref >=60)
EGFR: 69 mL/min/{1.73_m2} (ref >=60)
Globulin: 2.8 G/DL (ref 1.9–3.7)
Glucose: 263 MG/DL — ABNORMAL HIGH (ref 65–99)
Potassium: 5.2 mmol/L (ref 3.5–5.3)
Protein, Total: 7.3 G/DL (ref 6.1–8.1)
Sodium: 135 mmol/L (ref 135–146)

## 2015-08-12 ENCOUNTER — Encounter (INDEPENDENT_AMBULATORY_CARE_PROVIDER_SITE_OTHER): Payer: Self-pay | Admitting: Family Medicine

## 2015-08-14 ENCOUNTER — Encounter (INDEPENDENT_AMBULATORY_CARE_PROVIDER_SITE_OTHER): Payer: Self-pay | Admitting: Family Medicine

## 2015-08-23 ENCOUNTER — Encounter (INDEPENDENT_AMBULATORY_CARE_PROVIDER_SITE_OTHER): Payer: Self-pay | Admitting: Family Medicine

## 2015-09-10 ENCOUNTER — Encounter (INDEPENDENT_AMBULATORY_CARE_PROVIDER_SITE_OTHER): Payer: Self-pay | Admitting: Family Medicine

## 2015-09-12 ENCOUNTER — Other Ambulatory Visit (INDEPENDENT_AMBULATORY_CARE_PROVIDER_SITE_OTHER): Payer: Self-pay | Admitting: Family Medicine

## 2015-10-08 ENCOUNTER — Other Ambulatory Visit (INDEPENDENT_AMBULATORY_CARE_PROVIDER_SITE_OTHER): Payer: Self-pay | Admitting: Family Medicine

## 2015-10-23 ENCOUNTER — Ambulatory Visit (INDEPENDENT_AMBULATORY_CARE_PROVIDER_SITE_OTHER): Payer: Commercial Managed Care - POS | Admitting: Family Medicine

## 2015-10-23 ENCOUNTER — Encounter (INDEPENDENT_AMBULATORY_CARE_PROVIDER_SITE_OTHER): Payer: Self-pay | Admitting: Family Medicine

## 2015-10-23 VITALS — BP 128/77 | HR 79 | Temp 97.5°F | Resp 18 | Wt 222.0 lb

## 2015-10-23 DIAGNOSIS — I10 Essential (primary) hypertension: Secondary | ICD-10-CM

## 2015-10-23 DIAGNOSIS — I25700 Atherosclerosis of coronary artery bypass graft(s), unspecified, with unstable angina pectoris: Secondary | ICD-10-CM

## 2015-10-23 DIAGNOSIS — E119 Type 2 diabetes mellitus without complications: Secondary | ICD-10-CM

## 2015-10-23 MED ORDER — NIACIN 500 MG PO TABS
500.0000 mg | ORAL_TABLET | Freq: Every day | ORAL | Status: DC
Start: 2015-10-23 — End: 2015-12-26

## 2015-10-23 MED ORDER — SITAGLIPTIN PHOS-METFORMIN HCL 50-1000 MG PO TABS
1.0000 | ORAL_TABLET | Freq: Two times a day (BID) | ORAL | Status: DC
Start: 2015-10-23 — End: 2016-11-22

## 2015-10-23 NOTE — Progress Notes (Signed)
Subjective:      Date: 10/23/2015 8:51 AM   Patient ID: [name] [name] is a 70 y.o. male.    Chief Complaint:  Chief Complaint   Patient presents with   . Diabetes       HPI:  Diabetes  He presents for his follow-up diabetic visit. He has type 2 diabetes mellitus. Hypoglycemia symptoms include confusion, headaches and tremors. Pertinent negatives for hypoglycemia include no hunger, mood changes, nervousness/anxiousness, seizures, sleepiness, speech difficulty or sweats. Associated symptoms include fatigue. Pertinent negatives for diabetes include no blurred vision, no chest pain, no foot paresthesias, no foot ulcerations, no polydipsia, no polyphagia, no polyuria and no weakness. There are no hypoglycemic complications. There are no diabetic complications. Risk factors for coronary artery disease include diabetes mellitus, family history and hypertension. Current diabetic treatment includes oral agent (dual therapy) and insulin injections. He is compliant with treatment all of the time. His weight is stable. When asked about meal planning, he reported none. He has not had a previous visit with a dietitian. He rarely participates in exercise. His breakfast blood glucose is taken between 6-7 am. His breakfast blood glucose range is generally 70-90 mg/dl. He does not see a podiatrist.Eye exam is not current.       Problem List:  Patient Active Problem List   Diagnosis   . Chest pain   . Diabetes mellitus   . Heart attack   . CAD (coronary artery disease)   . Hypertension   . Diarrhea   . Non-ischemic cardiomyopathy   . Acute exacerbation of CHF (congestive heart failure)   . Periumbilical abdominal pain   . PUD (peptic ulcer disease)   . Cholelithiasis   . Elevated LFTs   . Acute dyspnea   . Elevated d-dimer   . Chest pain on breathing   . Acute on chronic congestive heart failure, unspecified congestive heart failure type   . CHF (congestive heart failure)   . Headaches due to old head injury   . Essential  hypertension       Current Medications:  Current Outpatient Prescriptions   Medication Sig Dispense Refill   . amlodipine-valsartan (EXFORGE) 10-320 MG per tablet Take 1 tablet by mouth daily. 90 tablet 3   . aspirin 81 MG chewable tablet Chew 1 tablet (81 mg total) by mouth daily.  0   . butalbital-acetaminophen-caffeine (FIORICET, ESGIC) 50-325-40 MG per tablet Take 1 tablet by mouth every 4 (four) hours as needed. 10 tablet 0   . carvedilol (COREG) 25 MG tablet TAKE 1 TABLET BY MOUTH TWICE A DAY WITH MEALS 60 tablet 0   . famotidine (PEPCID) 20 MG tablet TAKE 1 TABLET BY MOUTH TWICE A DAY 60 tablet 0   . furosemide (LASIX) 40 MG tablet Take 1 tablet (40 mg total) by mouth daily. 30 tablet 0   . LANTUS SOLOSTAR 100 UNIT/ML injection pen INJECT 40 TO 50 UNITS DAILY PER TESTS AS DIRECTED ONCE A DAY SUBCUTANEOUS 90 DAYS  3   . niacin 500 MG tablet Take 1 tablet (500 mg total) by mouth daily. 90 tablet 3   . ONE TOUCH ULTRA TEST test strip TEST BLOOD SUGAR 2-3 TIMES A DAY 300 each 1   . SITagliptin-metFORMIN (JANUMET) 50-1000 MG per tablet Take 1 tablet by mouth 2 (two) times daily with meals. 180 tablet 3     No current facility-administered medications for this visit.       Allergies:  No Known Allergies  Past Medical History:  Past Medical History   Diagnosis Date   . Heart attack 2011     and 2014   . Diabetes mellitus    . Hypertension    . Cardiac defibrillator in situ    . CAD (coronary artery disease)    . Ischemic cardiomyopathy      20%   . CHF (congestive heart failure) 08/12/2014   . AICD (automatic cardioverter/defibrillator) present 2014       Past Surgical History:  Past Surgical History   Procedure Laterality Date   . Bring back open heart     . Hernia repair       LIH   . Duodenal ulcer  1973   . Orthopedic surgery       R foot corrective   . Tonsillectomy and adenoidectomy  1954   . Egd N/A 08/15/2014     Procedure: EGD;  Surgeon: Colon Branch, MD;  Location: [facility];  Service:  Gastroenterology;  Laterality: N/A;  egd w/ bx       Family History:  Family History   Problem Relation Age of Onset   . Breast cancer Mother    . Heart attack Mother 58   . Diabetes Brother        Social History:  Social History     Social History   . Marital Status: Married     Spouse Name: Lajoyce Corners   . Number of Children: 0   . Years of Education: N/A     Occupational History   . 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 Smoking status: Never Smoker    . Smokeless tobacco: Never Used   . Alcohol Use: No   . Drug Use: No   . Sexual Activity: No     Other Topics Concern   . Not on file     Social History Narrative       The following portions of the patient's history were reviewed and updated as appropriate: allergies, current medications, past family history, past medical history, past social history, past surgical history and problem list.    Vitals:  BP 128/77 mmHg  Pulse 79  Temp(Src) 97.5 F (36.4 C) (Oral)  Resp 18  Wt 100.699 kg (222 lb)  SpO2 97%         ROS:  Constitutional: Negative for fever, chills and malaise/fatigue.   Respiratory: Negative for cough, shortness of breath and wheezing.    Cardiovascular: Negative for chest pain and palpitations.   Gastrointestinal: Negative for abdominal pain, diarrhea and constipation.   Musculoskeletal: Negative for joint pain.   Skin: Negative for rash.   Neurological: Negative for dizziness and headaches.   Psychiatric/Behavioral: The patient does not have insomnia.        Objective:     Physical Exam:  Constitutional: Patient is oriented to person, place, and time. Appears well-developed and well-nourished.   HENT:   Head: Normocephalic and atraumatic.   Eyes: Conjunctivae and EOM are normal. Pupils are equal, round, and reactive to light.   Cardiovascular: Normal rate, regular rhythm and normal heart sounds.  Exam reveals no gallop and no friction rub.    No murmur heard.  Pulmonary/Chest: Effort normal and breath sounds normal. Has no  wheezes.   Musculoskeletal: Normal range of motion. Exhibits no edema or tenderness.   Neurological: Patient is alert and oriented to person, place, and time.  Psychiatric: Patient has a normal mood and affect.       Assessment/Plan:       1. Essential hypertension  - Comprehensive metabolic panel; [place]  Well controlled with exforge     2. Coronary artery disease involving coronary bypass graft of native heart with unstable angina pectoris  - Lipid panel; Future  - niacin 500 MG tablet; Take 1 tablet (500 mg total) by mouth daily.  Dispense: 90 tablet; Refill: 3  Chol is well controlled     3. Type 2 diabetes mellitus without complication, without long-term current use of insulin  - Hemoglobin A1C; Future  - Protein / creatinine ratio, urine; Future  - SITagliptin-metFORMIN (JANUMET) 50-1000 MG per tablet; Take 1 tablet by mouth 2 (two) times daily with meals.  Dispense: 180 tablet; Refill: 3  Patient tells me that he took Janumet Q day. Recommend to take it bid , considering patient checks his glucose 3 x day and is in the 150-200 range . Please call me if BG goes below 100         Zorita Pang, MD

## 2015-10-23 NOTE — Patient Instructions (Signed)
Recommend to continue to push exercise and stay on a good diet . Try to get to the perfect BMI of 25. Start your day with a high fiber diet and  involve the family in your exercise regiment .

## 2015-11-08 ENCOUNTER — Other Ambulatory Visit (INDEPENDENT_AMBULATORY_CARE_PROVIDER_SITE_OTHER): Payer: Self-pay | Admitting: Family Medicine

## 2015-11-09 ENCOUNTER — Encounter (INDEPENDENT_AMBULATORY_CARE_PROVIDER_SITE_OTHER): Payer: Self-pay | Admitting: Family Medicine

## 2015-11-09 DIAGNOSIS — R7309 Other abnormal glucose: Secondary | ICD-10-CM

## 2015-11-09 DIAGNOSIS — E7800 Pure hypercholesterolemia, unspecified: Secondary | ICD-10-CM

## 2015-12-26 ENCOUNTER — Ambulatory Visit (INDEPENDENT_AMBULATORY_CARE_PROVIDER_SITE_OTHER): Payer: Commercial Managed Care - POS | Admitting: Family Medicine

## 2015-12-26 ENCOUNTER — Encounter (INDEPENDENT_AMBULATORY_CARE_PROVIDER_SITE_OTHER): Payer: Self-pay | Admitting: Family Medicine

## 2015-12-26 VITALS — BP 128/87 | HR 88 | Temp 97.9°F | Resp 16 | Ht 73.0 in | Wt 217.0 lb

## 2015-12-26 DIAGNOSIS — J020 Streptococcal pharyngitis: Secondary | ICD-10-CM

## 2015-12-26 LAB — POCT RAPID STREP A: Rapid Strep A Screen POCT: POSITIVE — AB

## 2015-12-26 LAB — POCT INFLUENZA A/B
POCT Rapid Influenza A AG: NEGATIVE
POCT Rapid Influenza B AG: NEGATIVE

## 2015-12-26 MED ORDER — AMOXICILLIN-POT CLAVULANATE 875-125 MG PO TABS
1.0000 | ORAL_TABLET | Freq: Two times a day (BID) | ORAL | Status: AC
Start: 2015-12-26 — End: 2016-01-02

## 2015-12-26 MED ORDER — HYDROCOD POLST-CPM POLST ER 10-8 MG/5ML PO SUER
5.0000 mL | Freq: Every evening | ORAL | Status: DC | PRN
Start: 2015-12-26 — End: 2016-01-06

## 2015-12-26 NOTE — Progress Notes (Signed)
Subjective:      Date: 12/26/2015 7:57 AM   Patient ID: [name] [name] is a 70 y.o. male.    Chief Complaint:  Chief Complaint   Patient presents with   . URI       HPI:  Nursing Documentation:  [name] alert status: Patient asked and denied any [name] restrictions for blood pressure/blood draws.  Has the patient seen any other providers since their last visit: no  The patient is due for nothing at this time, HM is up-to-date.    URI   This is a new problem. Episode onset: 2 weeks  The problem has been gradually worsening. Maximum temperature: not checked. Associated symptoms include chest pain, congestion, coughing, headaches, nausea, neck pain, sinus pain, a sore throat and vomiting. Pertinent negatives include no ear pain. Associated symptoms comments: vertigo. Treatments tried: otc medication. The treatment provided no relief.       Problem List:  Patient Active Problem List   Diagnosis   . Chest pain   . Diabetes mellitus   . Heart attack   . CAD (coronary artery disease)   . Hypertension   . Diarrhea   . Non-ischemic cardiomyopathy   . Acute exacerbation of CHF (congestive heart failure)   . Periumbilical abdominal pain   . PUD (peptic ulcer disease)   . Cholelithiasis   . Elevated LFTs   . Acute dyspnea   . Elevated d-dimer   . Chest pain on breathing   . Acute on chronic congestive heart failure, unspecified congestive heart failure type   . CHF (congestive heart failure)   . Headaches due to old head injury   . Essential hypertension       Current Medications:  Current Outpatient Prescriptions   Medication Sig Dispense Refill   . amlodipine-valsartan (EXFORGE) 10-320 MG per tablet Take 1 tablet by mouth daily. 90 tablet 3   . aspirin 81 MG chewable tablet Chew 1 tablet (81 mg total) by mouth daily.  0   . butalbital-acetaminophen-caffeine (FIORICET, ESGIC) 50-325-40 MG per tablet Take 1 tablet by mouth every 4 (four) hours as needed. 10 tablet 0   . carvedilol (COREG) 25 MG tablet TAKE 1 TABLET BY MOUTH TWICE A  DAY WITH MEALS 60 tablet 0   . famotidine (PEPCID) 20 MG tablet TAKE 1 TABLET BY MOUTH TWICE A DAY 60 tablet 0   . furosemide (LASIX) 40 MG tablet Take 1 tablet (40 mg total) by mouth daily. 30 tablet 0   . LANTUS SOLOSTAR 100 UNIT/ML injection pen INJECT 40 TO 50 UNITS DAILY PER TESTS AS DIRECTED ONCE A DAY SUBCUTANEOUS 90 DAYS 15 pen 3   . niacin (NIASPAN) 500 MG CR tablet TAKE 1 TABLET BY MOUTH EVERY DAY AT BEDTIME  3   . niacin 500 MG tablet Take 1 tablet (500 mg total) by mouth daily. 90 tablet 3   . ONE TOUCH ULTRA TEST test strip TEST BLOOD SUGAR 2-3 TIMES A DAY 300 each 1   . SITagliptin-metFORMIN (JANUMET) 50-1000 MG per tablet Take 1 tablet by mouth 2 (two) times daily with meals. 180 tablet 3   . spironolactone (ALDACTONE) 25 MG tablet        No current facility-administered medications for this visit.       Allergies:  No Known Allergies    Past Medical History:  Past Medical History   Diagnosis Date   . Heart attack 2011     and 2014   . Diabetes  mellitus    . Hypertension    . Cardiac defibrillator in situ    . CAD (coronary artery disease)    . Ischemic cardiomyopathy      20%   . CHF (congestive heart failure) 08/12/2014   . AICD (automatic cardioverter/defibrillator) present 2014       Past Surgical History:  Past Surgical History   Procedure Laterality Date   . Bring back open heart     . Hernia repair       LIH   . Duodenal ulcer  1973   . Orthopedic surgery       R foot corrective   . Tonsillectomy and adenoidectomy  1954   . Egd N/A 08/15/2014     Procedure: EGD;  Surgeon: Colon Branch, MD;  Location: [facility];  Service: Gastroenterology;  Laterality: N/A;  egd w/ bx       Family History:  Family History   Problem Relation Age of Onset   . Breast cancer Mother    . Heart attack Mother 52   . Diabetes Brother        Social History:  Social History     Social History   . Marital Status: Married     Spouse Name: Lajoyce Corners   . Number of Children: 0   . Years of Education: N/A      Occupational History   . 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 Smoking status: Never Smoker    . Smokeless tobacco: Never Used   . Alcohol Use: No   . Drug Use: No   . Sexual Activity: No     Other Topics Concern   . Not on file     Social History Narrative       The following portions of the patient's history were reviewed and updated as appropriate: allergies, current medications, past family history, past medical history, past social history, past surgical history and problem list.    ROS:   General/Constitutional:   Denies Chills. Admits Fatigue. Admits Fever.   Ophthalmologic:   Denies Eye Discharge. Denies Eye Redness.   ENT:   Admits Nasal Discharge. Denies Ear pain. Admits Sinus pain.   Respiratory:   Admits Cough. Denies Shortness of breath. Denies Wheezing.   Cardiovascular:   Denies Chest pain. Denies Swelling in hands/feet.   Gastrointestinal:   Denies Diarrhea. Denies Nausea. Admits post tussive emesis.  Hematology:   Admits Swollen glands.   Skin:   Denies Rash. Denies Skin lesion(s).   Neurologic:   Denies Dizziness. Admits Headache.        Objective:   BP 128/87 mmHg  Pulse 88  Temp(Src) 97.9 F (36.6 C) (Oral)  Resp 16  Ht 1.854 m (6\' 1" )  Wt 98.431 kg (217 lb)  BMI 28.64 kg/m2  SpO2 95%    Examination:   General Examination:   GENERAL APPEARANCE: alert, well hydrated, , uncomfortable due to sore throat.   HEAD: normal appearance, no sinus tenderness.   EYES: conjunctiva clear, no discharge.   EARS: EAC's intact B/L, tympanic membranes normal bilaterally no erythema.   NOSE: nares patent, no lesions.   THROAT: tonsils red, swollen, white exudate on tonsils.   NECK/THYROID: neck supple.   LYMPH NODES: palpable, tender cervical adenopathy.   SKIN: no rashes.   HEART: S1, S2 normal, no murmurs, rubs, gallops, regular rate and rhythm.   LUNGS: normal effort / no  distress, normal breath sounds, clear to auscultation bilaterally, no wheezes, rales, rhonchi.    EXTREMITIES: no clubbing, cyanosis, or edema.   NEUROLOGIC: alert and oriented.         Assessment/Plan:       1. Strep pharyngitis  - POCT Rapid Group A Strep  - POCT Influenza A/B  - amoxicillin-clavulanate (AUGMENTIN) 875-125 MG per tablet; Take 1 tablet by mouth 2 (two) times daily.  Dispense: 14 tablet; Refill: 0  - hydrocodone-chlorpheniramine (TUSSIONEX) 10-8 MG/5ML suspension; Take 5 mLs by mouth nightly as needed.  Dispense: 115 mL; Refill: 0  Notes: Positive Rapid Strep test reviewed with patient. Wash hands frequently. Change toothbrush after 3 days of taking antibiotic. Acetaminophen ( Tylenol ) as needed for fever.  Recommend initiating Amoxicillin/Clavulanate antibiotic therapy.  Take the full course of antibiotic therapy. Side effects of Amoxicillin/Clavulanate  were discussed with the patient including rash, nausea, GI upset, or diarrhea. Please do not take cough syrup prior to driving or operating heavy machinery.  Use Zyrtec x 7 days to help clear up congestion. Follow-up if any worsening or persistent fever, vomiting, shortness or breath, neck pain or profound headache.     Clinical Notes: The patient was instructed to complete entire course of antibiotic to reduce transmission of infection to others and reduce the risk of complications including post-Strep GN, Rheumatic Fever, otitis media or sinusitis.     Carney Bern, MD

## 2015-12-30 ENCOUNTER — Other Ambulatory Visit (INDEPENDENT_AMBULATORY_CARE_PROVIDER_SITE_OTHER): Payer: Self-pay | Admitting: Family Medicine

## 2016-01-06 ENCOUNTER — Encounter (INDEPENDENT_AMBULATORY_CARE_PROVIDER_SITE_OTHER): Payer: Self-pay | Admitting: Family Medicine

## 2016-01-06 ENCOUNTER — Ambulatory Visit (INDEPENDENT_AMBULATORY_CARE_PROVIDER_SITE_OTHER): Payer: Commercial Managed Care - POS | Admitting: Family Medicine

## 2016-01-06 ENCOUNTER — Ambulatory Visit (INDEPENDENT_AMBULATORY_CARE_PROVIDER_SITE_OTHER): Payer: Commercial Managed Care - POS

## 2016-01-06 VITALS — BP 122/79 | HR 86 | Temp 97.7°F | Resp 18 | Ht 72.0 in | Wt 207.0 lb

## 2016-01-06 DIAGNOSIS — R059 Cough, unspecified: Secondary | ICD-10-CM

## 2016-01-06 DIAGNOSIS — J020 Streptococcal pharyngitis: Secondary | ICD-10-CM

## 2016-01-06 DIAGNOSIS — R05 Cough: Secondary | ICD-10-CM

## 2016-01-06 DIAGNOSIS — R062 Wheezing: Secondary | ICD-10-CM

## 2016-01-06 DIAGNOSIS — R0981 Nasal congestion: Secondary | ICD-10-CM

## 2016-01-06 DIAGNOSIS — R0602 Shortness of breath: Secondary | ICD-10-CM

## 2016-01-06 DIAGNOSIS — J029 Acute pharyngitis, unspecified: Secondary | ICD-10-CM

## 2016-01-06 DIAGNOSIS — Z0000 Encounter for general adult medical examination without abnormal findings: Secondary | ICD-10-CM

## 2016-01-06 LAB — POCT INFLUENZA A/B
POCT Rapid Influenza A AG: NEGATIVE
POCT Rapid Influenza B AG: NEGATIVE

## 2016-01-06 LAB — POCT RAPID STREP A: Rapid Strep A Screen POCT: POSITIVE — AB

## 2016-01-06 MED ORDER — AMOXICILLIN-POT CLAVULANATE 875-125 MG PO TABS
1.0000 | ORAL_TABLET | Freq: Two times a day (BID) | ORAL | Status: AC
Start: 2016-01-06 — End: 2016-01-13

## 2016-01-06 MED ORDER — LEVALBUTEROL HCL 1.25 MG/3ML IN NEBU
1.2500 mg | INHALATION_SOLUTION | Freq: Once | RESPIRATORY_TRACT | Status: AC
Start: 2016-01-06 — End: 2016-01-06
  Administered 2016-01-06: 1.25 mg via RESPIRATORY_TRACT

## 2016-01-06 MED ORDER — HYDROCOD POLST-CPM POLST ER 10-8 MG/5ML PO SUER
5.0000 mL | Freq: Two times a day (BID) | ORAL | Status: DC
Start: 2016-01-06 — End: 2016-02-14

## 2016-01-06 MED ORDER — FLUTICASONE-SALMETEROL 100-50 MCG/DOSE IN AEPB
1.0000 | INHALATION_SPRAY | Freq: Two times a day (BID) | RESPIRATORY_TRACT | Status: DC
Start: 2016-01-06 — End: 2016-11-10

## 2016-01-06 NOTE — Progress Notes (Signed)
Subjective:      Date: 01/06/2016 11:48 AM   Patient ID: [name] [name] is a 70 y.o. male.    Chief Complaint:  Chief Complaint   Patient presents with   . Cough     x 3 weeks    Nursing Documentation:  Limb alert status: Patient asked and denied any limb restrictions for blood pressure/blood draws.  Has the patient seen any other providers since their last visit: no  The patient is due for eye exam, foot exam and pneumonia vaccine    HPI:  Cough  This is a new problem. Episode onset: 3 wekks ago. The problem has been gradually worsening. The problem occurs constantly. The cough is productive of sputum. Associated symptoms include chest pain, chills, headaches, postnasal drip, rhinorrhea, shortness of breath, sweats and wheezing. Associated symptoms comments: Dizziness. The symptoms are aggravated by lying down. He has tried prescription cough suppressant (Pt was perscribed antibiotics he did not finish course) for the symptoms. The treatment provided moderate relief.       Patient feels miserable for the last 3 weeks . Was  Started on on tibiotics for strep throat on the the first but did not make a big difference .    Problem List:  Patient Active Problem List   Diagnosis   . Chest pain   . Diabetes mellitus   . Heart attack   . CAD (coronary artery disease)   . Hypertension   . Diarrhea   . Non-ischemic cardiomyopathy   . Acute exacerbation of CHF (congestive heart failure)   . Periumbilical abdominal pain   . PUD (peptic ulcer disease)   . Cholelithiasis   . Elevated LFTs   . Acute dyspnea   . Elevated d-dimer   . Chest pain on breathing   . Acute on chronic congestive heart failure, unspecified congestive heart failure type   . CHF (congestive heart failure)   . Headaches due to old head injury   . Essential hypertension       Current Medications:  Current Outpatient Prescriptions   Medication Sig Dispense Refill   . amlodipine-valsartan (EXFORGE) 10-320 MG per tablet Take 1 tablet by mouth daily. 90 tablet 3    . aspirin 81 MG chewable tablet Chew 1 tablet (81 mg total) by mouth daily.  0   . carvedilol (COREG) 25 MG tablet TAKE 1 TABLET BY MOUTH TWICE A DAY WITH MEALS 60 tablet 0   . famotidine (PEPCID) 20 MG tablet TAKE 1 TABLET BY MOUTH TWICE A DAY 60 tablet 0   . furosemide (LASIX) 40 MG tablet Take 1 tablet (40 mg total) by mouth daily. 30 tablet 0   . LANTUS SOLOSTAR 100 UNIT/ML injection pen INJECT 40 TO 50 UNITS DAILY PER TESTS AS DIRECTED ONCE A DAY SUBCUTANEOUS 90 DAYS 15 pen 3   . niacin (NIASPAN) 500 MG CR tablet TAKE 1 TABLET BY MOUTH EVERY DAY AT BEDTIME  3   . ONE TOUCH ULTRA TEST test strip TEST BLOOD SUGAR 2-3 TIMES A DAY 300 each 1   . SITagliptin-metFORMIN (JANUMET) 50-1000 MG per tablet Take 1 tablet by mouth 2 (two) times daily with meals. 180 tablet 3   . spironolactone (ALDACTONE) 25 MG tablet      . amoxicillin-clavulanate (AUGMENTIN) 875-125 MG per tablet Take 1 tablet by mouth 2 (two) times daily. 14 tablet 0   . butalbital-acetaminophen-caffeine (FIORICET, ESGIC) 50-325-40 MG per tablet Take 1 tablet by mouth every 4 (four) hours  as needed. 10 tablet 0   . fluticasone-salmeterol (ADVAIR DISKUS) 100-50 MCG/DOSE Aerosol Powder, Breath Activtivatede Inhale 1 puff into the lungs 2 (two) times daily. 1 each 5   . hydrocodone-chlorpheniramine (TUSSIONEX) 10-8 MG/5ML suspension Take 5 mLs by mouth 2 (two) times daily. 115 mL 0     No current facility-administered medications for this visit.       Allergies:  No Known Allergies    Past Medical History:  Past Medical History   Diagnosis Date   . Heart attack 2011     and 2014   . Diabetes mellitus    . Hypertension    . Cardiac defibrillator in situ    . CAD (coronary artery disease)    . Ischemic cardiomyopathy      20%   . CHF (congestive heart failure) 08/12/2014   . AICD (automatic cardioverter/defibrillator) present 2014       Past Surgical History:  Past Surgical History   Procedure Laterality Date   . Bring back open heart     . Hernia repair        LIH   . Duodenal ulcer  1973   . Orthopedic surgery       R foot corrective   . Tonsillectomy and adenoidectomy  1954   . Egd N/A 08/15/2014     Procedure: EGD;  Surgeon: Colon Branch, MD;  Location: [facility];  Service: Gastroenterology;  Laterality: N/A;  egd w/ bx       Family History:  Family History   Problem Relation Age of Onset   . Breast cancer Mother    . Heart attack Mother 35   . Diabetes Brother        Social History:  Social History     Social History   . Marital Status: Married     Spouse Name: Lajoyce Corners   . Number of Children: 0   . Years of Education: N/A     Occupational History   . 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 Smoking status: Never Smoker    . Smokeless tobacco: Never Used   . Alcohol Use: No   . Drug Use: No   . Sexual Activity: No     Other Topics Concern   . Not on file     Social History Narrative       The following portions of the patient's history were reviewed and updated as appropriate: allergies, current medications, past family history, past medical history, past social history, past surgical history and problem list.    Vitals:  BP 122/79 mmHg  Pulse 86  Temp(Src) 97.7 F (36.5 C) (Oral)  Resp 18  Ht 1.829 m (6')  Wt 93.895 kg (207 lb)  BMI 28.07 kg/m2  SpO2 95%       ROS:  Constitutional: Pos for fever, chills and malaise/fatigue.   Respiratory: Pos for cough, shortness of breath and wheezing.    Cardiovascular: Negative for chest pain and palpitations.   Gastrointestinal: Pos for abdominal pain, diarrhea .   Musculoskeletal: Negative for joint pain.   Skin: Negative for rash.   Neurological: Pos for dizziness and headaches.   Psychiatric/Behavioral: The patient does not have insomnia.        Objective:     Physical Exam:  Constitutional: Patient is oriented to person, place, and time. Appears well-developed and well-nourished.   HENT:   Head: Normocephalic and atraumatic.  Eyes: Conjunctivae and EOM are normal. Pupils are  equal, round, and reactive to light.   Cardiovascular: Normal rate, regular rhythm and normal heart sounds.  Exam reveals no gallop and no friction rub.    No murmur heard.  Pulmonary/Chest: Effort normal and breath sounds wheezing and rhonchi both sides.  Musculoskeletal: Normal range of motion. Exhibits no edema or tenderness.   Neurological: Patient is alert and oriented to person, place, and time.   Psychiatric: Patient has a normal mood and affect.   HIgh BMI Follow Up   Encouragement to Exercise      Assessment/Plan:       1. Cough  - POCT Rapid Group A Strep  - POCT Influenza A/B  - XR Chest 2 Views  - levalbuterol (XOPENEX) nebulizer solution 1.25 mg; Take 3 mLs (1.25 mg total) by nebulization once.    - amoxicillin-clavulanate (AUGMENTIN) 875-125 MG per tablet; Take 1 tablet by mouth 2 (two) times daily.  Dispense: 14 tablet; Refill: 0  Start Vitamin C 500 mg up to 6 tablets every day, for the next 3-4 days. Place humidifier right in front of bed, and rest.  Please f/u if symptoms persist or getting worse .  2. Strep throat  - amoxicillin-clavulanate (AUGMENTIN) 875-125 MG per tablet; Take 1 tablet by mouth 2 (two) times daily.  Dispense: 14 tablet; Refill: 0        Zorita Pang, MD

## 2016-01-06 NOTE — Patient Instructions (Signed)
Adult Self-Care for Colds  Colds are caused by viruses. They can't be cured with antibiotics. However, you can relieve symptoms and support your body's efforts to heal itself. No matter which symptoms you have, be sure to drink plenty of fluids (water or clear soup); stop smoking and drinking alcohol; and get plenty of rest.   Understand a fever   Take your temperature several times a day. If your fever is100.4F(38.0C) for more than a day, call your doctor.   Relax, lie down. Go to bed if you want. Just get off your feet and rest. Also, drink plenty of fluids to avoid dehydration.   Take acetaminophen or a nonsteroidal anti-inflammatory agent (NSAID), such as ibuprofen.  Treat a troubled nose kindly   Breathe steam or heated humidified air to open blocked nasal passages. Stand in a hot shower or use a vaporizer. Be careful not to get burned by the steam.   Saline nasal sprays and decongestant tablets help open a stuffy nose. Antihistamines can also help, but they can cause side effects such as drowsiness and drying of the eyes, nose, and mouth.  Soothe a sore throat and cough   Gargle every2hours with1/4teaspoon of salt dissolved in1/2 cup of warm water. Suck on throat lozenges and cough drops to moisten your throat.   Cough medicines are available but it is unclear how effective they actually are.   Take acetaminophen or an NSAID, such as ibuprofen to ease throat pain  Ease digestive problems   Put fluid back into your body. Take frequent sips of clear liquids such as water or broth. Do not drink beverages with a lot of sugar in them, such as juices and sodas. These can make diarrhea worse. Older children and adults can drink sports drinks.   As your appetite returns, you can resume your normal diet. Ask your doctor whether there are any foods you should avoid.    When to seek medical care  When you first notice symptoms, ask your health care provider if antiviral medications are  appropriate.Antibiotics should not be taken for colds or flu. Also, call your doctor if you have any of the following symptoms or if you aren't feeling better after 7days:   Shortness of breath   Pain or pressure in the chest or abdomen   Worsening symptoms, especially after a period of improvement   Fever of100.4F (38.0C) or higher, or fever that doesn't go down with medication   Sudden dizziness or confusion   Severe or continued vomiting   Signs of dehydration, including extreme thirst, dark urine, infrequent urination, dry mouth   Spotted, red, or very sore throat   Date Last Reviewed: 04/06/2013   2000-2016 [employer] [address], [place] [address]. All rights reserved. This information is not intended as a substitute for professional medical care. Always follow your healthcare professional's instructions.

## 2016-01-06 NOTE — Addendum Note (Signed)
Addended byZorita Pang on: 01/06/2016 11:51 AM     Modules accepted: Orders

## 2016-01-11 ENCOUNTER — Ambulatory Visit (INDEPENDENT_AMBULATORY_CARE_PROVIDER_SITE_OTHER): Payer: Commercial Managed Care - POS

## 2016-01-13 ENCOUNTER — Ambulatory Visit (INDEPENDENT_AMBULATORY_CARE_PROVIDER_SITE_OTHER): Payer: Commercial Managed Care - POS

## 2016-01-15 ENCOUNTER — Encounter (INDEPENDENT_AMBULATORY_CARE_PROVIDER_SITE_OTHER): Payer: Self-pay | Admitting: Family Medicine

## 2016-01-15 ENCOUNTER — Ambulatory Visit (INDEPENDENT_AMBULATORY_CARE_PROVIDER_SITE_OTHER): Payer: Commercial Managed Care - POS

## 2016-01-15 ENCOUNTER — Ambulatory Visit (INDEPENDENT_AMBULATORY_CARE_PROVIDER_SITE_OTHER): Payer: Commercial Managed Care - POS | Admitting: Family Medicine

## 2016-01-15 VITALS — BP 120/76 | HR 75 | Temp 98.7°F | Resp 18 | Ht 72.0 in | Wt 204.0 lb

## 2016-01-15 DIAGNOSIS — R059 Cough, unspecified: Secondary | ICD-10-CM

## 2016-01-15 DIAGNOSIS — R05 Cough: Secondary | ICD-10-CM

## 2016-01-15 DIAGNOSIS — Z0000 Encounter for general adult medical examination without abnormal findings: Secondary | ICD-10-CM

## 2016-01-15 NOTE — Progress Notes (Signed)
Subjective:      Date: 01/15/2016 9:01 AM   Patient ID: [name] [name] is a 70 y.o. male.    Chief Complaint:  Chief Complaint   Patient presents with   . Annual Exam     blood work    Patient feels overall well . Is still coughing a lot . Cough is going on for the last 4-6 weeks . Otherwise is on good diet and is active .      Nursing Documentation:  Limb alert status: Patient asked and denied any limb restrictions for blood pressure/blood draws.  Has the patient seen any other providers since their last visit: yes  The patient is due for nothing at this time, HM is up-to-date.  HPI  Visit Type: Health Maintenance Visit  Work Status: working full-time  Reported Health: good health  Diet: compliant with  Exercise: regularly  Dental: regular dental visits twice a year  Vision: glasses and regular eye exams   Hearing: normal hearing  Immunization Status: immunizations up to date  Reproductive Health: sexually active  Prior Screening Tests: unsure last PSA testing and colonoscopy within past 10 years  General Health Risks: no family history of prostate cancer, no family history of colon cancer and family history of breast cancer  Safety Elements Used: uses seat belts, smoke detectors in household, carbon monoxide detectors in household and sunscreen use    Problem List:  Patient Active Problem List   Diagnosis   . Chest pain   . Diabetes mellitus   . Heart attack   . CAD (coronary artery disease)   . Hypertension   . Diarrhea   . Non-ischemic cardiomyopathy   . Acute exacerbation of CHF (congestive heart failure)   . Periumbilical abdominal pain   . PUD (peptic ulcer disease)   . Cholelithiasis   . Elevated LFTs   . Acute dyspnea   . Elevated d-dimer   . Chest pain on breathing   . Acute on chronic congestive heart failure, unspecified congestive heart failure type   . CHF (congestive heart failure)   . Headaches due to old head injury   . Essential hypertension       Current Medications:  Current Outpatient  Prescriptions   Medication Sig Dispense Refill   . amlodipine-valsartan (EXFORGE) 10-320 MG per tablet Take 1 tablet by mouth daily. 90 tablet 3   . aspirin 81 MG chewable tablet Chew 1 tablet (81 mg total) by mouth daily.  0   . butalbital-acetaminophen-caffeine (FIORICET, ESGIC) 50-325-40 MG per tablet Take 1 tablet by mouth every 4 (four) hours as needed. 10 tablet 0   . carvedilol (COREG) 25 MG tablet TAKE 1 TABLET BY MOUTH TWICE A DAY WITH MEALS 60 tablet 0   . famotidine (PEPCID) 20 MG tablet TAKE 1 TABLET BY MOUTH TWICE A DAY 60 tablet 0   . fluticasone-salmeterol (ADVAIR DISKUS) 100-50 MCG/DOSE Aerosol Powder, Breath Activtivatede Inhale 1 puff into the lungs 2 (two) times daily. 1 each 5   . furosemide (LASIX) 40 MG tablet Take 1 tablet (40 mg total) by mouth daily. 30 tablet 0   . hydrocodone-chlorpheniramine (TUSSIONEX) 10-8 MG/5ML suspension Take 5 mLs by mouth 2 (two) times daily. 115 mL 0   . LANTUS SOLOSTAR 100 UNIT/ML injection pen INJECT 40 TO 50 UNITS DAILY PER TESTS AS DIRECTED ONCE A DAY SUBCUTANEOUS 90 DAYS 15 pen 3   . niacin (NIASPAN) 500 MG CR tablet TAKE 1 TABLET BY MOUTH EVERY DAY  AT BEDTIME  3   . ONE TOUCH ULTRA TEST test strip TEST BLOOD SUGAR 2-3 TIMES A DAY 300 each 1   . SITagliptin-metFORMIN (JANUMET) 50-1000 MG per tablet Take 1 tablet by mouth 2 (two) times daily with meals. 180 tablet 3   . spironolactone (ALDACTONE) 25 MG tablet        No current facility-administered medications for this visit.       Allergies:  No Known Allergies    Past Medical History:  Past Medical History   Diagnosis Date   . Heart attack 2011     and 2014   . Diabetes mellitus    . Hypertension    . Cardiac defibrillator in situ    . CAD (coronary artery disease)    . Ischemic cardiomyopathy      20%   . CHF (congestive heart failure) 08/12/2014   . AICD (automatic cardioverter/defibrillator) present 2014       Past Surgical History:  Past Surgical History   Procedure Laterality Date   . Bring back open  heart     . Hernia repair       LIH   . Duodenal ulcer  1973   . Orthopedic surgery       R foot corrective   . Tonsillectomy and adenoidectomy  1954   . Egd N/A 08/15/2014     Procedure: EGD;  Surgeon: Colon Branch, MD;  Location: [facility];  Service: Gastroenterology;  Laterality: N/A;  egd w/ bx       Family History:  Family History   Problem Relation Age of Onset   . Breast cancer Mother    . Heart attack Mother 63   . Diabetes Brother        Social History:  Social History     Social History   . Marital Status: Married     Spouse Name: Lajoyce Corners   . Number of Children: 0   . Years of Education: N/A     Occupational History   . 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 Smoking status: Never Smoker    . Smokeless tobacco: Never Used   . Alcohol Use: No   . Drug Use: No   . Sexual Activity: No     Other Topics Concern   . Not on file     Social History Narrative       The following portions of the patient's history were reviewed and updated as appropriate: allergies, current medications, past family history, past medical history, past social history, past surgical history and problem list.    Vitals:  BP 120/76 mmHg  Pulse 75  Temp(Src) 98.7 F (37.1 C) (Oral)  Resp 18  Ht 1.829 m (6')  Wt 92.534 kg (204 lb)  BMI 27.66 kg/m2  SpO2 94%    ROS:   General/Constitutional:   Denies Change in appetite. Denies Chills. Denies Fatigue. Denies Fever. Denies Weight gain. Denies Weight loss.   Ophthalmologic:   Denies Blurred vision. Denies Diminished visual acuity. Denies Eye Pain.   ENT:   Denies Hearing Loss. Denies Nasal Discharge. Denies Hoarseness. Denies Ear pain. Denies Nosebleed. Denies Sinus pain. Denies Sore throat. Denies Sneezing.   Endocrine:   Denies Polydipsia. Denies Polyuria.   Cardiovascular:   Denies Chest pain. Denies Chest pain with exertion. Denies Leg Claudication. Denies Palpitations. Denies Swelling in hands/feet.   Respiratory:   Denies Paroxysmal Nocturnal  Dyspnea. Denies Cough. Denies Orthopnea. Denies Shortness of breath. Denies Daytime Hypersomnolence. Denies Snoring. Denies Witness Apnea. Denies Wheezing.   Gastrointestinal:   Denies Abdominal pain. Denies Blood in stool. Denies Constipation. Denies Diarrhea. Denies Heartburn. Denies Nausea. Denies Vomiting.   Hematology:   Denies Easy bruising. Denies Easy Bleeding.   Genitourinary:   Denies Blood in urine. Denies Nocturia.   Musculoskeletal:   Denies Joint pain. Denies Leg cramps. Denies Weakness in LE. Denies Swollen joints.   Peripheral Vascular:   Denies Cold extremities. Denies Decreased sensation in extremities. Denies Painful extremities.   Skin:   Denies Itching. Denies Change in Mole(s). Denies Rash.   Neurologic:   Denies Balance difficulty. Denies Dizziness. Denies Headache. Denies Pre-Syncope. Denies Memory loss.   Psychiatric:   Denies Anxiety. Denies Depressed mood. Denies Difficulty sleeping. Denies Mood Swings.       Objective:     Examination:   General Examination:   GENERAL APPEARANCE: alert, in no acute distress, well developed, well nourished, oriented to time, place, and person.   HEAD: normal appearance, atraumatic.   EYES: extraocular movement intact (EOMI), pupils equal, round, reactive to light and accommodation, sclera anicteric, conjunctiva clear.   EARS: tympanic membranes normal bilaterally, external canals normal .   NOSE: normal nasal mucosa, no lesions.   ORAL CAVITY: normal oropharynx, normal lips, mucosa moist, no lesions.   THROAT: normal appearance, clear, no erythema.   NECK/THYROID: neck supple, no carotid bruit, carotid pulse 2+ bilaterally, no cervical lymphadenopathy, no neck mass palpated, no jugular venous distention, no thyromegaly.   LYMPH NODES: no palpable adenopathy.   SKIN: good turgor, no rashes, no suspicious lesions.   HEART: S1, S2 normal, no murmurs, rubs, gallops, regular rate and rhythm.   LUNGS: normal effort / no distress, normal breath sounds, clear to  auscultation bilaterally, no wheezes, rales, rhonchi.   ABDOMEN: bowel sounds present, no hepatosplenomegaly, soft, nontender, nondistended.   MALE GENITOURINARY: no penile lesions or discharge, no testicular mass.   MUSCULOSKELETAL: full range of motion, no swelling or deformity.   EXTREMITIES: no edema, no clubbing, cyanosis, or edema.   PERIPHERAL PULSES: 2+ dorsalis pedis, 2+ posterior tibial.   NEUROLOGIC: nonfocal, cranial nerves 2-12 grossly intact, deep tendon reflexes 2+ symmetrical, normal strength, tone and reflexes, sensory exam intact.   PSYCH: cognitive function intact, mood/affect full range, speech clear.       Assessment/Plan:       1. Well adult exam  Recommend to continue to push exercise and stay on a good diet . Try to get to the perfect BMI of 25. Start your day with a high fiber diet and  involve the family in your exercise regiment .          Zorita Pang, MD

## 2016-01-15 NOTE — Patient Instructions (Signed)
Recommend to continue to push exercise and stay on a good diet . Try to get to the perfect BMI of 25. Start your day with a high fiber diet and  involve the family in your exercise regiment .

## 2016-01-16 LAB — COMPREHENSIVE METABOLIC PANEL
ALT: 13 U/L (ref 9–46)
AST (SGOT): 16 U/L (ref 10–35)
Albumin/Globulin Ratio: 1.2 (ref 1.0–2.5)
Albumin: 4.2 G/DL (ref 3.6–5.1)
Alkaline Phosphatase: 84 U/L (ref 40–115)
BUN: 14 MG/DL (ref 7–25)
Bilirubin, Total: 0.8 MG/DL (ref 0.2–1.2)
CO2: 25 mmol/L (ref 20–31)
Calcium: 9.6 MG/DL (ref 8.6–10.3)
Chloride: 106 mmol/L (ref 98–110)
Creatinine: 1.06 mg/dL (ref 0.70–1.25)
EGFR African American: 83 mL/min/{1.73_m2} (ref >=60)
EGFR: 71 mL/min/{1.73_m2} (ref >=60)
Globulin: 3.5 G/DL (ref 1.9–3.7)
Glucose: 169 MG/DL — ABNORMAL HIGH (ref 65–99)
Potassium: 5.2 mmol/L (ref 3.5–5.3)
Protein, Total: 7.7 G/DL (ref 6.1–8.1)
Sodium: 142 mmol/L (ref 135–146)

## 2016-01-16 LAB — CBC AND DIFFERENTIAL
Atypical Lymphocytes %: 0 %
Baso(Absolute): 33 cells/uL (ref 0–200)
Basophils: 0.3 %
Eosinophils Absolute: 1034 cells/uL — ABNORMAL HIGH (ref 15–500)
Eosinophils: 9.4 %
Hematocrit: 43.6 % (ref 38.5–50.0)
Hemoglobin: 13.6 g/dL (ref 13.2–17.1)
Lymphocytes Absolute: 4631 cells/uL — ABNORMAL HIGH (ref 850–3900)
Lymphocytes: 42.1 %
MCH: 29.3 pg (ref 27–33)
MCHC: 31.2 g/dL — ABNORMAL LOW (ref 32–36)
MCV: 94 fL (ref 80–100)
MPV: 10.2 fL (ref 7.5–12.5)
Monocytes Absolute: 638 cells/uL (ref 200–950)
Monocytes: 5.8 %
Neutrophils Absolute: 4664 cells/uL (ref 1500–7800)
Neutrophils: 42.4 %
Platelets: 174 10*3/uL (ref 140–400)
RBC: 4.64 10*6/uL (ref 4.20–5.80)
RDW: 15.6 % — ABNORMAL HIGH (ref 11.0–15.0)
WBC: 11 10*3/uL — ABNORMAL HIGH (ref 3.8–10.8)

## 2016-01-16 LAB — LIPID PANEL
Cholesterol / HDL Ratio: 4.1 (calc) (ref 0.0–5.0)
Cholesterol: 146 mg/dL (ref 125–200)
HDL: 36 mg/dL — AB (ref >=40)
LDL Calculated: 90 mg/dL (ref <130)
Non HDL Cholesterol (LDL and VLDL): 110 mg/dL
Triglycerides: 102 mg/dL (ref <150)

## 2016-01-16 LAB — HEMOGLOBIN A1C: Hemoglobin A1C: 8.6 % of total Hgb — ABNORMAL HIGH (ref <5.7)

## 2016-01-21 ENCOUNTER — Encounter (INDEPENDENT_AMBULATORY_CARE_PROVIDER_SITE_OTHER): Payer: Self-pay | Admitting: Family Medicine

## 2016-01-21 DIAGNOSIS — I10 Essential (primary) hypertension: Secondary | ICD-10-CM

## 2016-01-21 DIAGNOSIS — I25700 Atherosclerosis of coronary artery bypass graft(s), unspecified, with unstable angina pectoris: Secondary | ICD-10-CM

## 2016-01-21 DIAGNOSIS — E119 Type 2 diabetes mellitus without complications: Secondary | ICD-10-CM

## 2016-02-03 ENCOUNTER — Other Ambulatory Visit (INDEPENDENT_AMBULATORY_CARE_PROVIDER_SITE_OTHER): Payer: Self-pay | Admitting: Family Medicine

## 2016-02-03 DIAGNOSIS — I10 Essential (primary) hypertension: Secondary | ICD-10-CM

## 2016-02-03 DIAGNOSIS — I25700 Atherosclerosis of coronary artery bypass graft(s), unspecified, with unstable angina pectoris: Secondary | ICD-10-CM

## 2016-02-03 DIAGNOSIS — E119 Type 2 diabetes mellitus without complications: Secondary | ICD-10-CM

## 2016-02-05 ENCOUNTER — Encounter (INDEPENDENT_AMBULATORY_CARE_PROVIDER_SITE_OTHER): Payer: Self-pay | Admitting: Family Medicine

## 2016-02-08 ENCOUNTER — Other Ambulatory Visit (FREE_STANDING_LABORATORY_FACILITY): Payer: Commercial Managed Care - POS

## 2016-02-08 DIAGNOSIS — E119 Type 2 diabetes mellitus without complications: Secondary | ICD-10-CM

## 2016-02-08 LAB — PROTEIN / CREATININE RATIO, URINE
Urine Creatinine, Random: 186.2 mg/dL
Urine Protein Random: 27.5 mg/dL — ABNORMAL HIGH (ref 1.0–14.0)
Urine Protein/Creatinine Ratio: 0.1

## 2016-02-09 NOTE — Progress Notes (Signed)
Quick Note:    Result is in normal range. Please f/u as recommended at last visit. Earlier if any symptome's persist or getting worse .  ______

## 2016-02-10 ENCOUNTER — Encounter (INDEPENDENT_AMBULATORY_CARE_PROVIDER_SITE_OTHER): Payer: Self-pay

## 2016-02-14 ENCOUNTER — Ambulatory Visit (INDEPENDENT_AMBULATORY_CARE_PROVIDER_SITE_OTHER): Payer: Commercial Managed Care - POS | Admitting: Family Medicine

## 2016-02-14 ENCOUNTER — Encounter (INDEPENDENT_AMBULATORY_CARE_PROVIDER_SITE_OTHER): Payer: Self-pay | Admitting: Family Medicine

## 2016-02-14 ENCOUNTER — Ambulatory Visit (INDEPENDENT_AMBULATORY_CARE_PROVIDER_SITE_OTHER): Payer: Commercial Managed Care - POS

## 2016-02-14 VITALS — BP 134/71 | HR 90 | Temp 98.8°F | Resp 18 | Ht 72.0 in | Wt 200.0 lb

## 2016-02-14 DIAGNOSIS — R05 Cough: Secondary | ICD-10-CM

## 2016-02-14 DIAGNOSIS — J189 Pneumonia, unspecified organism: Secondary | ICD-10-CM

## 2016-02-14 DIAGNOSIS — R059 Cough, unspecified: Secondary | ICD-10-CM

## 2016-02-14 MED ORDER — HYDROCOD POLST-CPM POLST ER 10-8 MG/5ML PO SUER
5.0000 mL | Freq: Two times a day (BID) | ORAL | Status: DC
Start: 2016-02-14 — End: 2016-02-25

## 2016-02-14 MED ORDER — LEVOFLOXACIN 500 MG PO TABS
500.0000 mg | ORAL_TABLET | Freq: Every day | ORAL | Status: AC
Start: 2016-02-14 — End: 2016-02-21

## 2016-02-14 NOTE — Progress Notes (Addendum)
Subjective:      Date: 02/14/2016 5:08 PM   Patient ID: [name] [name] is a 70 y.o. male.    Chief Complaint:  Chief Complaint   Patient presents with   . Sinusitis     Nursing Documentation:  Limb alert status: Patient asked and denied any limb restrictions for blood pressure/blood draws.  Has the patient seen any other providers since their last visit: no  The patient is due for colonoscopy  And ophthalmology.  HPI:  Sinusitis  This is a chronic problem. Episode onset: 3 months  The problem has been gradually worsening since onset. There has been no fever. His pain is at a severity of 10/10. The pain is severe. Associated symptoms include chills, congestion, coughing, headaches, a hoarse voice, neck pain, shortness of breath, sinus pressure, sneezing and a sore throat. Pertinent negatives include no swollen glands. Past treatments include oral decongestants. The treatment provided no relief.     Patient has severe cough since over TWO week . Problems breathing and very fatigue.  Problem List:  Patient Active Problem List   Diagnosis   . Chest pain   . Diabetes mellitus   . Heart attack   . CAD (coronary artery disease)   . Hypertension   . Diarrhea   . Non-ischemic cardiomyopathy   . Acute exacerbation of CHF (congestive heart failure)   . Periumbilical abdominal pain   . PUD (peptic ulcer disease)   . Cholelithiasis   . Elevated LFTs   . Acute dyspnea   . Elevated d-dimer   . Chest pain on breathing   . Acute on chronic congestive heart failure, unspecified congestive heart failure type   . CHF (congestive heart failure)   . Headaches due to old head injury   . Essential hypertension       Current Medications:  Current Outpatient Prescriptions   Medication Sig Dispense Refill   . amlodipine-valsartan (EXFORGE) 10-320 MG per tablet Take 1 tablet by mouth daily. 90 tablet 3   . aspirin 81 MG chewable tablet Chew 1 tablet (81 mg total) by mouth daily.  0   . butalbital-acetaminophen-caffeine (FIORICET, ESGIC)  50-325-40 MG per tablet Take 1 tablet by mouth every 4 (four) hours as needed. 10 tablet 0   . carvedilol (COREG) 25 MG tablet TAKE 1 TABLET BY MOUTH TWICE A DAY WITH MEALS 60 tablet 0   . fluticasone-salmeterol (ADVAIR DISKUS) 100-50 MCG/DOSE Aerosol Powder, Breath Activtivatede Inhale 1 puff into the lungs 2 (two) times daily. 1 each 5   . furosemide (LASIX) 40 MG tablet Take 1 tablet (40 mg total) by mouth daily. 30 tablet 0   . LANTUS SOLOSTAR 100 UNIT/ML injection pen INJECT 40 TO 50 UNITS DAILY PER TESTS AS DIRECTED ONCE A DAY SUBCUTANEOUS 90 DAYS 15 pen 3   . niacin (NIASPAN) 500 MG CR tablet TAKE 1 TABLET BY MOUTH EVERY DAY AT BEDTIME  3   . ONE TOUCH ULTRA TEST test strip TEST BLOOD SUGAR 2-3 TIMES A DAY 300 each 1   . SITagliptin-metFORMIN (JANUMET) 50-1000 MG per tablet Take 1 tablet by mouth 2 (two) times daily with meals. 180 tablet 3   . spironolactone (ALDACTONE) 25 MG tablet      . famotidine (PEPCID) 20 MG tablet TAKE 1 TABLET BY MOUTH TWICE A DAY 60 tablet 0   . hydrocodone-chlorpheniramine (TUSSIONEX) 10-8 MG/5ML suspension Take 5 mLs by mouth 2 (two) times daily. 115 mL 0   . levoFLOXacin (LEVAQUIN)  500 MG tablet Take 1 tablet (500 mg total) by mouth daily. 7 tablet 0     No current facility-administered medications for this visit.       Allergies:  No Known Allergies    Past Medical History:  Past Medical History   Diagnosis Date   . Heart attack 2011     and 2014   . Diabetes mellitus    . Hypertension    . Cardiac defibrillator in situ    . CAD (coronary artery disease)    . Ischemic cardiomyopathy      20%   . CHF (congestive heart failure) 08/12/2014   . AICD (automatic cardioverter/defibrillator) present 2014       Past Surgical History:  Past Surgical History   Procedure Laterality Date   . Bring back open heart     . Hernia repair       LIH   . Duodenal ulcer  1973   . Orthopedic surgery       R foot corrective   . Tonsillectomy and adenoidectomy  1954   . Egd N/A 08/15/2014      Procedure: EGD;  Surgeon: Colon Branch, MD;  Location: [facility];  Service: Gastroenterology;  Laterality: N/A;  egd w/ bx       Family History:  Family History   Problem Relation Age of Onset   . Breast cancer Mother    . Heart attack Mother 87   . Diabetes Brother        Social History:  Social History     Social History   . Marital Status: Married     Spouse Name: Lajoyce Corners   . Number of Children: 0   . Years of Education: N/A     Occupational History   . 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 Smoking status: Never Smoker    . Smokeless tobacco: Never Used   . Alcohol Use: No   . Drug Use: No   . Sexual Activity: No     Other Topics Concern   . Not on file     Social History Narrative       The following portions of the patient's history were reviewed and updated as appropriate: allergies, current medications, past family history, past medical history, past social history, past surgical history and problem list.    Vitals:  BP 134/71 mmHg  Pulse 90  Temp(Src) 98.8 F (37.1 C) (Oral)  Resp 18  Ht 1.829 m (6')  Wt 90.719 kg (200 lb)  BMI 27.12 kg/m2  SpO2 95%       ROS:  Constitutional: Negative for fever, chills and malaise/fatigue.   Respiratory: Pos for cough, shortness of breath and wheezing.    Cardiovascular: Negative for chest pain and palpitations.   Gastrointestinal: Negative for abdominal pain, slight diarrhea and no constipation.   Musculoskeletal: Negative for joint pain.   Skin: Negative for rash.   Neurological: POS for dizziness and headaches.   Psychiatric/Behavioral: The patient does  have insomnia.   HIgh BMI Follow Up  Encouragement to Exercise Foot Exam: 1 PODIATRIC: Inspection: B/L feet - normal appearance, normal coloration, no fissures or  macerations, No evidence of nail dystrophy, No evidence of paronychia, Normal  skin pigmentation / coloration, Monofilament testing demonstrates normal  sensation B/L feet.         Objective:     Physical  Exam:  Constitutional: Patient  is oriented to person, place, and time. Appears well-developed and well-nourished.   HENT:   Head: Normocephalic and atraumatic.   Eyes: Conjunctivae and EOM are normal. Pupils are equal, round, and reactive to light.   Cardiovascular: Normal rate, regular rhythm and normal heart sounds.  Exam reveals no gallop and no friction rub.    No murmur heard.  Pulmonary/Chest: Effort normal and breath sounds normal. Has no wheezes.   Musculoskeletal: Normal range of motion. Exhibits no edema or tenderness.   Neurological: Patient is alert and oriented to person, place, and time.   Psychiatric: Patient has a normal mood and affect.       Assessment/Plan:       1. Cough  - XR Chest 2 Views  - hydrocodone-chlorpheniramine (TUSSIONEX) 10-8 MG/5ML suspension; Take 5 mLs by mouth 2 (two) times daily.  Dispense: 115 mL; Refill: 0    2. Pneumonia of left lower lobe due to infectious organism  - levoFLOXacin (LEVAQUIN) 500 MG tablet; Take 1 tablet (500 mg total) by mouth daily.  Dispense: 7 tablet; Refill: 0        Zorita Pang, MD

## 2016-02-19 ENCOUNTER — Ambulatory Visit (INDEPENDENT_AMBULATORY_CARE_PROVIDER_SITE_OTHER): Payer: Commercial Managed Care - POS | Admitting: Family Medicine

## 2016-02-19 ENCOUNTER — Encounter (INDEPENDENT_AMBULATORY_CARE_PROVIDER_SITE_OTHER): Payer: Self-pay | Admitting: Family Medicine

## 2016-02-19 VITALS — BP 109/68 | HR 80 | Temp 98.6°F | Resp 18 | Wt 201.0 lb

## 2016-02-19 DIAGNOSIS — E119 Type 2 diabetes mellitus without complications: Secondary | ICD-10-CM

## 2016-02-19 DIAGNOSIS — J189 Pneumonia, unspecified organism: Secondary | ICD-10-CM | POA: Insufficient documentation

## 2016-02-19 NOTE — Patient Instructions (Signed)
Adult Self-Care for Colds  Colds are caused by viruses. They can't be cured with antibiotics. However, you can relieve symptoms and support your body's efforts to heal itself. No matter which symptoms you have, be sure to drink plenty of fluids (water or clear soup); stop smoking and drinking alcohol; and get plenty of rest.   Understand a fever   Take your temperature several times a day. If your fever is100.4F(38.0C) for more than a day, call your doctor.   Relax, lie down. Go to bed if you want. Just get off your feet and rest. Also, drink plenty of fluids to avoid dehydration.   Take acetaminophen or a nonsteroidal anti-inflammatory agent (NSAID), such as ibuprofen.  Treat a troubled nose kindly   Breathe steam or heated humidified air to open blocked nasal passages. Stand in a hot shower or use a vaporizer. Be careful not to get burned by the steam.   Saline nasal sprays and decongestant tablets help open a stuffy nose. Antihistamines can also help, but they can cause side effects such as drowsiness and drying of the eyes, nose, and mouth.  Soothe a sore throat and cough   Gargle every2hours with1/4teaspoon of salt dissolved in1/2 cup of warm water. Suck on throat lozenges and cough drops to moisten your throat.   Cough medicines are available but it is unclear how effective they actually are.   Take acetaminophen or an NSAID, such as ibuprofen to ease throat pain  Ease digestive problems   Put fluid back into your body. Take frequent sips of clear liquids such as water or broth. Do not drink beverages with a lot of sugar in them, such as juices and sodas. These can make diarrhea worse. Older children and adults can drink sports drinks.   As your appetite returns, you can resume your normal diet. Ask your doctor whether there are any foods you should avoid.    When to seek medical care  When you first notice symptoms, ask your health care provider if antiviral medications are  appropriate.Antibiotics should not be taken for colds or flu. Also, call your doctor if you have any of the following symptoms or if you aren't feeling better after 7days:   Shortness of breath   Pain or pressure in the chest or abdomen   Worsening symptoms, especially after a period of improvement   Fever of100.4F (38.0C) or higher, or fever that doesn't go down with medication   Sudden dizziness or confusion   Severe or continued vomiting   Signs of dehydration, including extreme thirst, dark urine, infrequent urination, dry mouth   Spotted, red, or very sore throat   Date Last Reviewed: 04/06/2013   2000-2016 [employer] [address], [place] [address]. All rights reserved. This information is not intended as a substitute for professional medical care. Always follow your healthcare professional's instructions.

## 2016-02-19 NOTE — Progress Notes (Signed)
Subjective:      Date: 02/19/2016 9:51 AM   Patient ID: [name] [name] is a 70 y.o. male.    Chief Complaint:  Chief Complaint   Patient presents with   . Diabetes      Nursing Documentation:  Limb alert status: Patient asked and denied any limb restrictions for blood pressure/blood draws.  Has the patient seen any other providers since their last visit: no  The patient is due for colonoscopy  HPI:  Diabetes  He presents for his follow-up diabetic visit. He has type 2 diabetes mellitus. His disease course has been improving. There are no hypoglycemic associated symptoms. There are no diabetic associated symptoms. There are no hypoglycemic complications. Risk factors for coronary artery disease include diabetes mellitus, family history and hypertension. Current diabetic treatment includes oral agent (triple therapy). He is compliant with treatment all of the time. His weight is stable. When asked about meal planning, he reported none. He has not had a previous visit with a dietitian. He participates in exercise three times a week. His breakfast blood glucose range is generally 90-110 mg/dl. Eye exam is current.    Since patient is on antibiotics feels fare better .  Still sweating , but all other symptomes are getting better .    Problem List:  Patient Active Problem List   Diagnosis   . Chest pain   . Diabetes mellitus   . Heart attack   . CAD (coronary artery disease)   . Hypertension   . Diarrhea   . Non-ischemic cardiomyopathy   . Acute exacerbation of CHF (congestive heart failure)   . Periumbilical abdominal pain   . PUD (peptic ulcer disease)   . Cholelithiasis   . Elevated LFTs   . Acute dyspnea   . Elevated d-dimer   . Chest pain on breathing   . Acute on chronic congestive heart failure, unspecified congestive heart failure type   . CHF (congestive heart failure)   . Headaches due to old head injury   . Essential hypertension   . Pneumonia due to infectious organism, unspecified laterality, unspecified part  of lung       Current Medications:  Current Outpatient Prescriptions   Medication Sig Dispense Refill   . amlodipine-valsartan (EXFORGE) 10-320 MG per tablet Take 1 tablet by mouth daily. 90 tablet 3   . aspirin 81 MG chewable tablet Chew 1 tablet (81 mg total) by mouth daily.  0   . butalbital-acetaminophen-caffeine (FIORICET, ESGIC) 50-325-40 MG per tablet Take 1 tablet by mouth every 4 (four) hours as needed. 10 tablet 0   . carvedilol (COREG) 25 MG tablet TAKE 1 TABLET BY MOUTH TWICE A DAY WITH MEALS 60 tablet 0   . famotidine (PEPCID) 20 MG tablet TAKE 1 TABLET BY MOUTH TWICE A DAY 60 tablet 0   . fluticasone-salmeterol (ADVAIR DISKUS) 100-50 MCG/DOSE Aerosol Powder, Breath Activtivatede Inhale 1 puff into the lungs 2 (two) times daily. 1 each 5   . furosemide (LASIX) 40 MG tablet Take 1 tablet (40 mg total) by mouth daily. 30 tablet 0   . hydrocodone-chlorpheniramine (TUSSIONEX) 10-8 MG/5ML suspension Take 5 mLs by mouth 2 (two) times daily. 115 mL 0   . LANTUS SOLOSTAR 100 UNIT/ML injection pen INJECT 40 TO 50 UNITS DAILY PER TESTS AS DIRECTED ONCE A DAY SUBCUTANEOUS 90 DAYS 15 pen 3   . levoFLOXacin (LEVAQUIN) 500 MG tablet Take 1 tablet (500 mg total) by mouth daily. 7 tablet 0   .  niacin (NIASPAN) 500 MG CR tablet TAKE 1 TABLET BY MOUTH EVERY DAY AT BEDTIME  3   . ONE TOUCH ULTRA TEST test strip TEST BLOOD SUGAR 2-3 TIMES A DAY 300 each 1   . SITagliptin-metFORMIN (JANUMET) 50-1000 MG per tablet Take 1 tablet by mouth 2 (two) times daily with meals. 180 tablet 3   . spironolactone (ALDACTONE) 25 MG tablet        No current facility-administered medications for this visit.       Allergies:  No Known Allergies    Past Medical History:  Past Medical History   Diagnosis Date   . Heart attack 2011     and 2014   . Diabetes mellitus    . Hypertension    . Cardiac defibrillator in situ    . CAD (coronary artery disease)    . Ischemic cardiomyopathy      20%   . CHF (congestive heart failure) 08/12/2014   . AICD  (automatic cardioverter/defibrillator) present 2014       Past Surgical History:  Past Surgical History   Procedure Laterality Date   . Bring back open heart     . Hernia repair       LIH   . Duodenal ulcer  1973   . Orthopedic surgery       R foot corrective   . Tonsillectomy and adenoidectomy  1954   . Egd N/A 08/15/2014     Procedure: EGD;  Surgeon: Colon Branch, MD;  Location: [facility];  Service: Gastroenterology;  Laterality: N/A;  egd w/ bx       Family History:  Family History   Problem Relation Age of Onset   . Breast cancer Mother    . Heart attack Mother 47   . Diabetes Brother        Social History:  Social History     Social History   . Marital Status: Married     Spouse Name: Lajoyce Corners   . Number of Children: 0   . Years of Education: N/A     Occupational History   . 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 Smoking status: Never Smoker    . Smokeless tobacco: Never Used   . Alcohol Use: No   . Drug Use: No   . Sexual Activity: Yes     Other Topics Concern   . Not on file     Social History Narrative       The following portions of the patient's history were reviewed and updated as appropriate: allergies, current medications, past family history, past medical history, past social history, past surgical history and problem list.    Vitals:  BP 109/68 mmHg  Pulse 80  Temp(Src) 98.6 F (37 C) (Oral)  Resp 18  Wt 91.173 kg (201 lb)  SpO2 96%       ROS:  Constitutional: Negative for fever, chills and malaise/fatigue.   Respiratory: Pos for cough, shortness of breath and wheezing. But is all getting better .    Cardiovascular: Negative for chest pain and palpitations.   Gastrointestinal: Negative for abdominal pain, diarrhea and constipation.   Musculoskeletal: Negative for joint pain.   Skin: Negative for rash.   Neurological: Negative for dizziness and headaches. Better   Psychiatric/Behavioral: The patient does not have insomnia.  Better .      Objective:      Physical Exam:  Constitutional:  Patient is oriented to person, place, and time. Appears well-developed and well-nourished.   HENT:   Head: Normocephalic and atraumatic.   Eyes: Conjunctivae and EOM are normal. Pupils are equal, round, and reactive to light.   Cardiovascular: Normal rate, regular rhythm and normal heart sounds.  Exam reveals no gallop and no friction rub.    No murmur heard.  Pulmonary/Chest: Effort normal and breath sounds normal. Has no wheezes.   Musculoskeletal: Normal range of motion. Exhibits no edema or tenderness.   Neurological: Patient is alert and oriented to person, place, and time.   Psychiatric: Patient has a normal mood and affect.   HIgh BMI Follow Up   Encouragement to Exercise    Assessment/Plan:       1. Type 2 diabetes mellitus without complication, without long-term current use of insulin  At home in 100 range over the last week     2. Pneumonia due to infectious organism, unspecified laterality, unspecified part of lung  Patient is getting better . Do not recommend to go to work this week .          Zorita Pang, MD

## 2016-02-20 ENCOUNTER — Observation Stay: Payer: Commercial Managed Care - POS | Admitting: Internal Medicine

## 2016-02-20 ENCOUNTER — Observation Stay
Admission: EM | Admit: 2016-02-20 | Discharge: 2016-02-21 | Disposition: A | Discharge status: Home / Self Care | Payer: Commercial Managed Care - POS | Attending: Internal Medicine | Admitting: Internal Medicine

## 2016-02-20 ENCOUNTER — Emergency Department: Payer: Commercial Managed Care - POS

## 2016-02-20 ENCOUNTER — Encounter: Payer: Self-pay | Admitting: Nurse Practitioner

## 2016-02-20 DIAGNOSIS — I5022 Chronic systolic (congestive) heart failure: Secondary | ICD-10-CM

## 2016-02-20 DIAGNOSIS — J219 Acute bronchiolitis, unspecified: Secondary | ICD-10-CM | POA: Insufficient documentation

## 2016-02-20 DIAGNOSIS — I255 Ischemic cardiomyopathy: Secondary | ICD-10-CM | POA: Insufficient documentation

## 2016-02-20 DIAGNOSIS — Z7982 Long term (current) use of aspirin: Secondary | ICD-10-CM | POA: Insufficient documentation

## 2016-02-20 DIAGNOSIS — I272 Other secondary pulmonary hypertension: Secondary | ICD-10-CM | POA: Insufficient documentation

## 2016-02-20 DIAGNOSIS — R55 Syncope and collapse: Secondary | ICD-10-CM | POA: Diagnosis present

## 2016-02-20 DIAGNOSIS — Z7984 Long term (current) use of oral hypoglycemic drugs: Secondary | ICD-10-CM | POA: Insufficient documentation

## 2016-02-20 DIAGNOSIS — I340 Nonrheumatic mitral (valve) insufficiency: Secondary | ICD-10-CM | POA: Insufficient documentation

## 2016-02-20 DIAGNOSIS — I6782 Cerebral ischemia: Secondary | ICD-10-CM | POA: Insufficient documentation

## 2016-02-20 DIAGNOSIS — Z794 Long term (current) use of insulin: Secondary | ICD-10-CM | POA: Diagnosis present

## 2016-02-20 DIAGNOSIS — I252 Old myocardial infarction: Secondary | ICD-10-CM | POA: Insufficient documentation

## 2016-02-20 DIAGNOSIS — I509 Heart failure, unspecified: Secondary | ICD-10-CM | POA: Diagnosis present

## 2016-02-20 DIAGNOSIS — Z95810 Presence of automatic (implantable) cardiac defibrillator: Secondary | ICD-10-CM

## 2016-02-20 DIAGNOSIS — I428 Other cardiomyopathies: Secondary | ICD-10-CM | POA: Diagnosis present

## 2016-02-20 DIAGNOSIS — E119 Type 2 diabetes mellitus without complications: Secondary | ICD-10-CM | POA: Insufficient documentation

## 2016-02-20 DIAGNOSIS — I219 Acute myocardial infarction, unspecified: Secondary | ICD-10-CM | POA: Diagnosis present

## 2016-02-20 DIAGNOSIS — I2510 Atherosclerotic heart disease of native coronary artery without angina pectoris: Secondary | ICD-10-CM | POA: Diagnosis present

## 2016-02-20 DIAGNOSIS — I10 Essential (primary) hypertension: Secondary | ICD-10-CM | POA: Diagnosis present

## 2016-02-20 DIAGNOSIS — I517 Cardiomegaly: Secondary | ICD-10-CM | POA: Insufficient documentation

## 2016-02-20 DIAGNOSIS — E118 Type 2 diabetes mellitus with unspecified complications: Secondary | ICD-10-CM | POA: Diagnosis present

## 2016-02-20 DIAGNOSIS — J189 Pneumonia, unspecified organism: Secondary | ICD-10-CM | POA: Diagnosis present

## 2016-02-20 DIAGNOSIS — S0990XA Unspecified injury of head, initial encounter: Principal | ICD-10-CM | POA: Insufficient documentation

## 2016-02-20 LAB — CBC AND DIFFERENTIAL
Basophils Absolute Automated: 0.03 10*3/uL (ref 0.00–0.20)
Basophils Automated: 0 %
Eosinophils Absolute Automated: 0.19 10*3/uL (ref 0.00–0.70)
Eosinophils Automated: 2 %
Hematocrit: 40.4 % — ABNORMAL LOW (ref 42.0–52.0)
Hgb: 13.3 g/dL (ref 13.0–17.0)
Lymphocytes Absolute Automated: 2.99 10*3/uL (ref 0.50–4.40)
Lymphocytes Automated: 34 %
MCH: 30.4 pg (ref 28.0–32.0)
MCHC: 32.9 g/dL (ref 32.0–36.0)
MCV: 92.2 fL (ref 80.0–100.0)
MPV: 10.9 fL (ref 9.4–12.3)
Monocytes Absolute Automated: 0.88 10*3/uL (ref 0.00–1.20)
Monocytes: 10 %
Neutrophils Absolute: 4.67 10*3/uL (ref 1.80–8.10)
Neutrophils: 53 %
Platelets: 287 10*3/uL (ref 140–400)
RBC: 4.38 10*6/uL — ABNORMAL LOW (ref 4.70–6.00)
RDW: 14 % (ref 12–15)
WBC: 8.76 10*3/uL (ref 3.50–10.80)

## 2016-02-20 LAB — ECG 12-LEAD
Atrial Rate: 72 {beats}/min
P Axis: 77 degrees
P-R Interval: 130 ms
Q-T Interval: 416 ms
QRS Duration: 94 ms
QTC Calculation (Bezet): 455 ms
R Axis: 32 degrees
T Axis: 152 degrees
Ventricular Rate: 72 {beats}/min

## 2016-02-20 LAB — COMPREHENSIVE METABOLIC PANEL
ALT: 23 U/L (ref 0–55)
AST (SGOT): 30 U/L (ref 5–34)
Albumin/Globulin Ratio: 0.7 — ABNORMAL LOW (ref 0.9–2.2)
Albumin: 3.4 g/dL — ABNORMAL LOW (ref 3.5–5.0)
Alkaline Phosphatase: 87 U/L (ref 38–106)
Anion Gap: 15 (ref 5.0–15.0)
BUN: 12 mg/dL (ref 9.0–28.0)
Bilirubin, Total: 0.6 mg/dL (ref 0.2–1.2)
CO2: 21 mEq/L — ABNORMAL LOW (ref 22–29)
Calcium: 9.7 mg/dL (ref 8.5–10.5)
Chloride: 102 mEq/L (ref 100–111)
Creatinine: 1 mg/dL (ref 0.7–1.3)
Globulin: 4.9 g/dL — ABNORMAL HIGH (ref 2.0–3.6)
Glucose: 127 mg/dL — ABNORMAL HIGH (ref 70–100)
Potassium: 5.3 mEq/L — ABNORMAL HIGH (ref 3.5–5.1)
Protein, Total: 8.3 g/dL (ref 6.0–8.3)
Sodium: 138 mEq/L (ref 136–145)

## 2016-02-20 LAB — GFR: EGFR: >60

## 2016-02-20 LAB — CBC
Hematocrit: 39.4 % — ABNORMAL LOW (ref 42.0–52.0)
Hgb: 13 g/dL (ref 13.0–17.0)
MCH: 30.6 pg (ref 28.0–32.0)
MCHC: 33 g/dL (ref 32.0–36.0)
MCV: 92.7 fL (ref 80.0–100.0)
MPV: 10.6 fL (ref 9.4–12.3)
Platelets: 305 10*3/uL (ref 140–400)
RBC: 4.25 10*6/uL — ABNORMAL LOW (ref 4.70–6.00)
RDW: 15 % (ref 12–15)
WBC: 9.81 10*3/uL (ref 3.50–10.80)

## 2016-02-20 LAB — PT AND APTT
PT INR: 1.1
PT: 14.3 s (ref 12.6–15.0)
PTT: 53 s — ABNORMAL HIGH (ref 23–37)

## 2016-02-20 LAB — GLUCOSE WHOLE BLOOD - POCT: Whole Blood Glucose POCT: 150 mg/dL — ABNORMAL HIGH (ref 70–100)

## 2016-02-20 LAB — B-TYPE NATRIURETIC PEPTIDE: B-Natriuretic Peptide: 311 pg/mL — ABNORMAL HIGH (ref 0–100)

## 2016-02-20 LAB — TROPONIN I
Troponin I: 0.03 ng/mL (ref 0.00–0.09)
Troponin I: 0.03 ng/mL (ref 0.00–0.09)

## 2016-02-20 LAB — CELL MORPHOLOGY
Cell Morphology: NORMAL
Platelet Estimate: NORMAL

## 2016-02-20 MED ORDER — DEXTROSE 10 % IV BOLUS
125.0000 mL | INTRAVENOUS | Status: DC | PRN
Start: 2016-02-20 — End: 2016-02-21

## 2016-02-20 MED ORDER — ASPIRIN 81 MG PO CHEW
81.0000 mg | CHEWABLE_TABLET | Freq: Every day | ORAL | Status: DC
Start: 2016-02-21 — End: 2016-02-21
  Administered 2016-02-21: 81 mg via ORAL
  Filled 2016-02-20: qty 1

## 2016-02-20 MED ORDER — ENOXAPARIN SODIUM 40 MG/0.4ML SC SOLN
40.0000 mg | Freq: Every day | SUBCUTANEOUS | Status: DC
Start: 2016-02-20 — End: 2016-02-21
  Administered 2016-02-21: 40 mg via SUBCUTANEOUS
  Filled 2016-02-20: qty 0.4

## 2016-02-20 MED ORDER — AMLODIPINE BESYLATE 5 MG PO TABS
10.0000 mg | ORAL_TABLET | Freq: Every day | ORAL | Status: DC
Start: 2016-02-21 — End: 2016-02-21
  Administered 2016-02-21: 10 mg via ORAL
  Filled 2016-02-20: qty 2

## 2016-02-20 MED ORDER — SODIUM CHLORIDE 0.9 % IJ SOLN
3.0000 mL | Freq: Three times a day (TID) | INTRAMUSCULAR | Status: DC
Start: 2016-02-20 — End: 2016-02-21

## 2016-02-20 MED ORDER — GLUCOSE 40 % PO GEL
15.0000 g | ORAL | Status: DC | PRN
Start: 2016-02-20 — End: 2016-02-21

## 2016-02-20 MED ORDER — FUROSEMIDE 40 MG PO TABS
40.0000 mg | ORAL_TABLET | Freq: Every day | ORAL | Status: DC
Start: 2016-02-21 — End: 2016-02-21
  Administered 2016-02-21: 40 mg via ORAL
  Filled 2016-02-20: qty 1

## 2016-02-20 MED ORDER — ALBUTEROL-IPRATROPIUM 2.5-0.5 (3) MG/3ML IN SOLN
3.0000 mL | Freq: Four times a day (QID) | RESPIRATORY_TRACT | Status: DC | PRN
Start: 2016-02-20 — End: 2016-02-21

## 2016-02-20 MED ORDER — FAMOTIDINE 20 MG PO TABS
20.0000 mg | ORAL_TABLET | Freq: Every day | ORAL | Status: DC
Start: 2016-02-21 — End: 2016-02-21
  Administered 2016-02-21: 20 mg via ORAL
  Filled 2016-02-20: qty 1

## 2016-02-20 MED ORDER — FLUTICASONE-SALMETEROL 100-50 MCG/DOSE IN AEPB
1.0000 | INHALATION_SPRAY | Freq: Two times a day (BID) | RESPIRATORY_TRACT | Status: DC
Start: 2016-02-21 — End: 2016-02-21
  Administered 2016-02-21: 1 via RESPIRATORY_TRACT
  Filled 2016-02-20: qty 14

## 2016-02-20 MED ORDER — CARBAMIDE PEROXIDE 6.5 % OT SOLN
5.0000 [drp] | Freq: Two times a day (BID) | OTIC | Status: DC
Start: 2016-02-20 — End: 2016-02-21
  Administered 2016-02-21: 5 [drp] via OTIC
  Filled 2016-02-20: qty 15

## 2016-02-20 MED ORDER — VALSARTAN 160 MG PO TABS
320.0000 mg | ORAL_TABLET | Freq: Every day | ORAL | Status: DC
Start: 2016-02-21 — End: 2016-02-21
  Administered 2016-02-21: 320 mg via ORAL
  Filled 2016-02-20: qty 2

## 2016-02-20 MED ORDER — NALOXONE HCL 0.4 MG/ML IJ SOLN (WRAP)
0.2000 mg | INTRAMUSCULAR | Status: DC | PRN
Start: 2016-02-20 — End: 2016-02-20

## 2016-02-20 MED ORDER — CARVEDILOL 12.5 MG PO TABS
25.0000 mg | ORAL_TABLET | Freq: Two times a day (BID) | ORAL | Status: DC
Start: 2016-02-21 — End: 2016-02-21
  Administered 2016-02-21 (×2): 25 mg via ORAL
  Filled 2016-02-20 (×2): qty 2

## 2016-02-20 MED ORDER — INSULIN REGULAR HUMAN 100 UNIT/ML IJ SOLN
1.0000 [IU] | Freq: Every evening | INTRAMUSCULAR | Status: DC | PRN
Start: 2016-02-20 — End: 2016-02-21

## 2016-02-20 MED ORDER — INSULIN REGULAR HUMAN 100 UNIT/ML IJ SOLN
1.0000 [IU] | Freq: Three times a day (TID) | INTRAMUSCULAR | Status: DC | PRN
Start: 2016-02-20 — End: 2016-02-21
  Administered 2016-02-21 (×2): 2 [IU] via SUBCUTANEOUS
  Filled 2016-02-20 (×2): qty 6

## 2016-02-20 MED ORDER — BUTALBITAL-APAP-CAFFEINE 50-325-40 MG PO TABS
1.0000 | ORAL_TABLET | ORAL | Status: DC | PRN
Start: 2016-02-20 — End: 2016-02-21

## 2016-02-20 MED ORDER — INSULIN GLARGINE 100 UNIT/ML SC SOLN
40.0000 [IU] | Freq: Every evening | SUBCUTANEOUS | Status: DC
Start: 2016-02-21 — End: 2016-02-21

## 2016-02-20 MED ORDER — NIACIN ER (ANTIHYPERLIPIDEMIC) 500 MG PO TBCR
500.0000 mg | EXTENDED_RELEASE_TABLET | Freq: Every evening | ORAL | Status: DC
Start: 2016-02-21 — End: 2016-02-21
  Filled 2016-02-20 (×3): qty 1

## 2016-02-20 MED ORDER — MORPHINE SULFATE 2 MG/ML IJ/IV SOLN (WRAP)
1.0000 mg | Status: DC | PRN
Start: 2016-02-20 — End: 2016-02-21

## 2016-02-20 MED ORDER — ACETAMINOPHEN 325 MG PO TABS
325.0000 mg | ORAL_TABLET | Freq: Four times a day (QID) | ORAL | Status: DC | PRN
Start: 2016-02-20 — End: 2016-02-21
  Administered 2016-02-21: 325 mg via ORAL
  Filled 2016-02-20: qty 1

## 2016-02-20 MED ORDER — GLUCAGON 1 MG IJ SOLR (WRAP)
1.0000 mg | INTRAMUSCULAR | Status: DC | PRN
Start: 2016-02-20 — End: 2016-02-21

## 2016-02-20 MED ORDER — NALOXONE HCL 0.4 MG/ML IJ SOLN (WRAP)
0.2000 mg | INTRAMUSCULAR | Status: DC | PRN
Start: 2016-02-20 — End: 2016-02-21

## 2016-02-20 MED ORDER — CARBAMIDE PEROXIDE 6.5 % OT SOLN
OTIC | Status: AC
Start: 2016-02-20 — End: 2016-02-20
  Administered 2016-02-20: 5 [drp] via OTIC
  Filled 2016-02-20: qty 15

## 2016-02-20 NOTE — ED Provider Notes (Signed)
Physician/Midlevel provider first contact with patient: 02/20/16 1315         History     Chief Complaint   Patient presents with   . Loss of Consciousness   . Dizziness     HPI     Nursing (triage) note reviewed for the following pertinent information:  pt states that while removing wax from ear last night, apparently had LOC, witnessed by spouse. Also had similar episode of dizziness with LOC @ 0230. States that he believes he struck head  with both falls.     Patient Name: [name] [name], [name] [name], 70 y.o., male      DR. Aryana Wonnacott  is the primary attending for this patient and performed the HPI, PE, and medical decision making for this patient.    History obtained by: patient  Chief Complaint/Onset/Duration/Quality/Location/Severity/Context: Patient with multiple bouts of syncope the 1st one occurring at 8:30 PM yesterday while he was trying to clean his left ear.  He states he felt vertiginous and that had a syncope for approximately 3-4 minutes with head trauma.  There are 2:30 AM he tried cleaning his left ear out again and felt dizzy and syncopized again for approximately 3-4 minutes again.  Reported head trauma again.  Close primary care doctor today and was told to come in for evaluation.  Component of a mild headache only.  Also states he has some difficulty hearing out of his left ear.  Aggravating Factors/Alleviating Factors: Aggravated by cleaning out his left ear. Alleviated by laying flat.  Associated Symptoms: Denies chest pain, shortness of breath, nausea, vomiting, focal neurological deficits.        *This note was generated by the Epic EMR system/ Dragon speech recognition and may contain inherent errors or omissions not intended by the user. Grammatical errors, random word insertions, deletions, pronoun errors and incomplete sentences are occasional consequences of this technology due to software limitations. Not all errors are caught or corrected. If there are questions or concerns about the  content of this note or information contained within the body of this dictation they should be addressed directly with the author for clarification          Past Medical History   Diagnosis Date   . Heart attack 2011     and 2014   . Diabetes mellitus    . Hypertension    . Cardiac defibrillator in situ    . CAD (coronary artery disease)    . Ischemic cardiomyopathy      20%   . CHF (congestive heart failure) 08/12/2014   . AICD (automatic cardioverter/defibrillator) present 2014       Past Surgical History   Procedure Laterality Date   . Bring back open heart     . Hernia repair       LIH   . Duodenal ulcer  1973   . Orthopedic surgery       R foot corrective   . Tonsillectomy and adenoidectomy  1954   . Egd N/A 08/15/2014     Procedure: EGD;  Surgeon: Colon Branch, MD;  Location: [facility];  Service: Gastroenterology;  Laterality: N/A;  egd w/ bx       Family History   Problem Relation Age of Onset   . Breast cancer Mother    . Heart attack Mother 88   . Diabetes Brother        Social  Social History   Substance Use Topics   . Smoking  status: Never Smoker    . Smokeless tobacco: Never Used   . Alcohol Use: No       .     No Known Allergies    Home Medications                   amlodipine-valsartan (EXFORGE) 10-320 MG per tablet     Take 1 tablet by mouth daily.     aspirin 81 MG chewable tablet     Chew 1 tablet (81 mg total) by mouth daily.     butalbital-acetaminophen-caffeine (FIORICET, ESGIC) 50-325-40 MG per tablet     Take 1 tablet by mouth every 4 (four) hours as needed.     carvedilol (COREG) 25 MG tablet     TAKE 1 TABLET BY MOUTH TWICE A DAY WITH MEALS     famotidine (PEPCID) 20 MG tablet     TAKE 1 TABLET BY MOUTH TWICE A DAY     fluticasone-salmeterol (ADVAIR DISKUS) 100-50 MCG/DOSE Aerosol Powder, Breath Activtivatede     Inhale 1 puff into the lungs 2 (two) times daily.     furosemide (LASIX) 40 MG tablet     Take 1 tablet (40 mg total) by mouth daily.     hydrocodone-chlorpheniramine  (TUSSIONEX) 10-8 MG/5ML suspension     Take 5 mLs by mouth 2 (two) times daily.     LANTUS SOLOSTAR 100 UNIT/ML injection pen     INJECT 40 TO 50 UNITS DAILY PER TESTS AS DIRECTED ONCE A DAY SUBCUTANEOUS 90 DAYS     levoFLOXacin (LEVAQUIN) 500 MG tablet     Take 1 tablet (500 mg total) by mouth daily.     niacin (NIASPAN) 500 MG CR tablet     TAKE 1 TABLET BY MOUTH EVERY DAY AT BEDTIME     ONE TOUCH ULTRA TEST test strip     TEST BLOOD SUGAR 2-3 TIMES A DAY     SITagliptin-metFORMIN (JANUMET) 50-1000 MG per tablet     Take 1 tablet by mouth 2 (two) times daily with meals.     spironolactone (ALDACTONE) 25 MG tablet                Review of Systems   Constitutional: Negative for fever and chills.   HENT: Negative for sore throat.    Respiratory: Negative for shortness of breath.    Cardiovascular: Negative for chest pain.   Gastrointestinal: Negative for nausea, vomiting and abdominal pain.   Musculoskeletal: Negative for back pain and neck pain.   Skin: Negative for pallor and rash.   Neurological: Positive for syncope. Negative for headaches.   All other systems reviewed and are negative.      Physical Exam    BP: 131/83 mmHg, Heart Rate: 81, Temp: 97.5 F (36.4 C), Resp Rate: 18, SpO2: 97 %    Physical Exam   Constitutional: He is oriented to person, place, and time. He appears distressed.   HENT:   Head: Normocephalic and atraumatic.   Mouth/Throat: Oropharynx is clear and moist.   Right tympanic membrane and external canal normal.  Left tympanic membrane obscured with wax located deep.  Left tympanic canal appears uninfected.   Eyes: Conjunctivae and EOM are normal.   Neck: Normal range of motion. Neck supple.   Cardiovascular: Normal rate, regular rhythm, normal heart sounds and intact distal pulses.    Pulmonary/Chest: Effort normal and breath sounds normal. No respiratory distress.   Abdominal: Soft. Bowel sounds are normal. He  exhibits no distension. There is no tenderness. There is no CVA tenderness.    Musculoskeletal: Normal range of motion. He exhibits no edema.   Neurological: He is alert and oriented to person, place, and time. He has normal strength. No cranial nerve deficit. Coordination normal.   No pronator drift.  No facial droop.  Finger-nose normal bilateral upper extremities.  No significant gait abnormality appreciated.    Skin: Skin is warm and dry. No rash noted.   Nursing note and vitals reviewed.        MDM and ED Course     ED Medication Orders     Start Ordered     Status Ordering Provider    02/20/16 1800 02/20/16 1325  carbamide peroxide (DEBROX) otic solution 5 drop   2 times daily     Route: Left Ear  Ordered Dose: 5 drop     Last MAR action:  Hold Renaud Celli             MDM  Number of Diagnoses or Management Options  Diagnosis management comments: DR. Helayne Seminole  is the primary attending for this patient and has obtained and performed the history, PE, and medical decision making for this patient.    Differential diagnosis to include but not limited to: vasovagal, arrythmia, dehydration, cad.      Oxygen saturation by pulse oximetry is 95%-100%, Normal.  Interventions: None Needed.    Attending Dr. Helayne Seminole           Amount and/or Complexity of Data Reviewed  Clinical lab tests: ordered and reviewed  Tests in the radiology section of CPT: ordered and reviewed  Independent visualization of images, tracings, or specimens: yes    Risk of Complications, Morbidity, and/or Mortality  Presenting problems: high  Diagnostic procedures: high  Management options: high        Oxygen saturation by pulse oximetry is 95%-100%, Normal.  Interventions: None Needed.    Attending Dr. Helayne Seminole    EKG Interpretation  EKG interpreted independently by Dr. Helayne Seminole    Rate: Normal  Rhythm: sinus rhythm  Axis: Normal  ST-T Segments: no st elevation, nonspec lateral changes   Conduction: No blocks  Impression: Non-specific EKG    No significant change from previous EKG on  record.    Helayne Seminole, MD               signout to dr. Nonda Lou at 2p, labs, ct head, cxr pending. Pt stable. Multiple risk factors with 2 episodes of syncope. Admission for overnight cardiac monitoring. Has seen [facility] in the past.     Results     Procedure Component Value Units Date/Time    Basic Metabolic Panel [[id number]]  (Abnormal) Collected:  02/21/16 0535    Specimen Information:  Blood Updated:  02/21/16 0603     Glucose 178 (H) mg/dL      BUN 40.9 mg/dL      Creatinine 0.9 mg/dL      Calcium 8.8 mg/dL      Sodium 811 mEq/L      Potassium 4.4 mEq/L      Chloride 104 mEq/L      CO2 23 mEq/L      Anion Gap 9.0     Magnesium [[id number]] Collected:  02/21/16 0535    Specimen Information:  Blood Updated:  02/21/16 0603     Magnesium 1.9 mg/dL     GFR [[id number]] Collected:  02/21/16 0535  EGFR >60.0 Updated:  02/21/16 0603    CBC with differential [[id number]]  (Abnormal) Collected:  02/21/16 0535    Specimen Information:  Blood from Blood Updated:  02/21/16 0547     WBC 9.34 x10 3/uL      Hgb 12.3 (L) g/dL      Hematocrit 40.9 (L) %      Platelets 264 x10 3/uL      RBC 4.11 (L) x10 6/uL      MCV 93.2 fL      MCH 29.9 pg      MCHC 32.1 g/dL      RDW 15 %      MPV 10.4 fL      Neutrophils 51 %      Lymphocytes Automated 38 %      Monocytes 9 %      Eosinophils Automated 2 %      Basophils Automated 0 %      Immature Granulocyte 0 %      Neutrophils Absolute 4.74 x10 3/uL      Abs Lymph Automated 3.53 x10 3/uL      Abs Mono Automated 0.86 x10 3/uL      Abs Eos Automated 0.19 x10 3/uL      Absolute Baso Automated 0.02 x10 3/uL      Absolute Immature Granulocyte 0.03 x10 3/uL     Lipid panel (Fasting) [[id number]] Collected:  02/21/16 0535    Specimen Information:  Blood Updated:  02/21/16 0535    Narrative:      Fasting specimen    Troponin I [[id number]] Collected:  02/21/16 0012    Specimen Information:  Blood Updated:  02/21/16 0110     Troponin I 0.03 ng/mL     Glucose Whole Blood - POCT [[id number]]   (Abnormal) Collected:  02/20/16 2124     POCT - Glucose Whole blood 150 (H) mg/dL Updated:  52/84/13 2440    Troponin I [[id number]] Collected:  02/20/16 2148    Specimen Information:  Blood Updated:  02/20/16 2223     Troponin I 0.03 ng/mL     CBC [[id number]]  (Abnormal) Collected:  02/20/16 2148    Specimen Information:  Blood from Blood Updated:  02/20/16 2155     WBC 9.81 x10 3/uL      Hgb 13.0 g/dL      Hematocrit 95.6 (L) %      Platelets 305 x10 3/uL      RBC 4.25 (L) x10 6/uL      MCV 92.7 fL      MCH 30.6 pg      MCHC 33.0 g/dL      RDW 15 %      MPV 10.6 fL     Narrative:      If not ordered in ED    PT/APTT [[id number]]  (Abnormal) Collected:  02/20/16 1425     PT 14.3 sec Updated:  02/20/16 1445     PT INR 1.1      PT Anticoag. Given Within 48 hrs. Unknown      PTT 53 (H) sec     Cell MorpHology [[id number]] Collected:  02/20/16 1340     Cell Morphology: Normal Updated:  02/20/16 1431     Platelet Estimate Normal     CBC with differential [[id number]]  (Abnormal) Collected:  02/20/16 1340    Specimen Information:  Blood from Blood Updated:  02/20/16 1431     WBC 8.76 x10 3/uL  Hgb 13.3 g/dL      Hematocrit 24.2 (L) %      Platelets 287 x10 3/uL      RBC 4.38 (L) x10 6/uL      MCV 92.2 fL      MCH 30.4 pg      MCHC 32.9 g/dL      RDW 14 %      MPV 10.9 fL      Neutrophils 53 %      Lymphocytes Automated 34 %      Monocytes 10 %      Eosinophils Automated 2 %      Basophils Automated 0 %      Immature Granulocyte Unmeasured %      Nucleated RBC Unmeasured /100 WBC      Neutrophils Absolute 4.67 x10 3/uL      Abs Lymph Automated 2.99 x10 3/uL      Abs Mono Automated 0.88 x10 3/uL      Abs Eos Automated 0.19 x10 3/uL      Absolute Baso Automated 0.03 x10 3/uL      Absolute Immature Granulocyte Unmeasured x10 3/uL     B-type Natriuretic Peptide [[id number]]  (Abnormal) Collected:  02/20/16 1340    Specimen Information:  Blood Updated:  02/20/16 1421     B-Natriuretic Peptide 311 (H) pg/mL     Troponin I  [[id number]] Collected:  02/20/16 1340    Specimen Information:  Blood Updated:  02/20/16 1421     Troponin I 0.03 ng/mL     Comprehensive metabolic panel [[id number]]  (Abnormal) Collected:  02/20/16 1340    Specimen Information:  Blood Updated:  02/20/16 1421     Glucose 127 (H) mg/dL      BUN 24.5 mg/dL      Creatinine 1.0 mg/dL      Sodium 809 mEq/L      Potassium 5.3 (H) mEq/L      Chloride 102 mEq/L      CO2 21 (L) mEq/L      Calcium 9.7 mg/dL      Protein, Total 8.3 g/dL      Albumin 3.4 (L) g/dL      AST (SGOT) 30 U/L      ALT 23 U/L      Alkaline Phosphatase 87 U/L      Bilirubin, Total 0.6 mg/dL      Globulin 4.9 (H) g/dL      Albumin/Globulin Ratio 0.7 (L)      Anion Gap 15.0     GFR [[id number]] Collected:  02/20/16 1340     EGFR >60.0 Updated:  02/20/16 1421          Radiology Results (24 Hour)     Procedure Component Value Units Date/Time    XR Chest 2 Views [[id number]] Collected:  02/20/16 1427    Order Status:  Completed Updated:  02/20/16 1434    Narrative:      Clinical History: Syncope.    Findings: PA and lateral views of the chest. Compared with prior studies  including 12/29/2013 and 02/14/2016.    Mild tortuosity of the thoracic aorta. Single lead pacemaker wire tip  lies over or just beyond the level of the cavoatrial junction and is  unchanged in position. Cardiac silhouette is not enlarged. Pulmonary  vascularity is normal.     Bilateral peribronchial thickening compatible with nonspecific  bronchiolitis. New patchy bilateral lower lobe airspace disease, more  pronounced on the left than right, suspicious  for pneumonia. No pleural  effusion.    Mild rightward thoracolumbar spinal curvature and multilevel  spondylosis.      Impression:       Bronchiolitis and new lower lobe airspace disease suspicious  for pneumonia. Recommend short-term follow study to confirm resolution.    Please see above for additional findings.    Darra Lis, MD   02/20/2016 2:30 PM      CT Head WO Contrast  [[id number]] Collected:  02/20/16 1404    Order Status:  Completed Updated:  02/20/16 1411    Narrative:      HISTORY: Syncope, trauma    TECHNIQUE: Axial CT images of the head were obtained, without  intravenous contrast.    The following dose reduction techniques were utilized: automated  exposure control and/or adjustment of the mA and/or kV according to  patient size, and the use of iterative reconstruction technique.    COMPARISON: 10/27/2010    FINDINGS: There are mild periventricular hypodensities, suggesting  chronic small vessel ischemic changes. Atherosclerotic vascular  calcifications are identified. There is no mass effect or midline shift.  There is no evidence of an acute intracranial hemorrhage or territorial  infarct. The osseous structures are unremarkable. There are inflammatory  changes in the visualized paranasal sinuses.      Impression:         1. No acute intracranial hemorrhage or mass effect.  2. Mild chronic appearing small vessel ischemic changes.    Nicoletta Dress, MD   02/20/2016 2:07 PM                  Procedures    Clinical Impression & Disposition     Clinical Impression  Final diagnoses:   Syncope and collapse   Ischemic cardiomyopathy   Head injury, initial encounter   AICD (automatic cardioverter/defibrillator) present        ED Disposition     Observation Admitting Physician: Judson Roch [[id number]]  Diagnosis: Syncope and collapse [780.2.ICD-9-CM]  Estimated Length of Stay: < 2 midnights  Tentative Discharge Plan?: Home or Self Care [1]  Patient Class: Observation [104]             Current Discharge Medication List                      Helayne Seminole, MD  02/21/16 [phone]

## 2016-02-20 NOTE — ED Provider Notes (Addendum)
EMERGENCY DEPARTMENT TRANSITION OF CARE    70 y.o. male presents to the ED with syncope X 2 over the last 24 hours.  Patient signed out to me by Dr. Drue Second pending further eval.  Please see his note for full details.    FOCUSED PHYSICAL EXAM      Filed Vitals:    02/20/16 1724   BP: 138/92   Pulse: 79   Temp:    Resp: 16   SpO2: 96%   Nursing note and vitals reviewed.  Constitutional:  Well developed, well nourished.  Awake & alert.    Head:  Atraumatic.  Normocephalic.    Eyes:  PERRL.  EOMI.  Conjunctivae are not pale.  ENT:  Mucous membranes are dry but intact.  Oropharynx is clear and symmetric.  Patent airway.  Neck:  Supple.  Full ROM.  No JVD.  No lymphadenopathy. No carotid bruit bilaterally. No c-spine ttp or bony deformity.  Cardiovascular:  Regular rate.  Regular rhythm.   Pulmonary/Chest:  No evidence of respiratory distress.  Clear to auscultation bilaterally.  No wheezing, rales or rhonchi. Chest non-tender.  Abdominal:  Soft and non-distended.  There is no tenderness.  No rebound, guarding, or rigidity.  No pulsatile mass.  No abdominal bruit.  Back:  No CVA tenderness. No TL spine ttp or bony deformity.  Extremities:  No edema.   No cyanosis.  No clubbing.  No ttp or deformity. No calf tenderness.  Skin:  Skin is warm and dry.  No diaphoresis.  Neurological:  B/l UE and LE motor function 5/5, B/l LE and UE sensation intact, cranial nerves 2-12 intact, normal speech. Normal gate.  Psychiatric:  Good eye contact.  Normal interaction, affect, and behavior    MEDICAL DECISION MAKING      DISCUSSION    Plan to admit patient.  Patient initially refusing.  D/w Dr. Marnette Burgess [facility] who recommends patient have his ACID interrogated.  I spoke to [facility] Judes rep and it ill be some hours before he can get here and he will still not have a cardiology consult at [facility]. Dr. Marnette Burgess further states patient has not been seen by them for over 2 years which given ischemic cardiomyopathy with EF of 20% that is  certainly concerning. CXR with questionable PNA but patient just completed course of abx and now with no cough/fever likely pending radiographic resolution but clinically not with PNA.  This is NOT sepsis. On further d/w patient he is agreeable to admission for further eval. Dr. Murvin Natal accepts to his service at [facility] on tele obs for further care.  Patient stable for transfer and agrees to transfer via PTS.    RESULTS    FOR FULL LABORATORY AND RADIOLOGY RESULTS PLEASE SEE CHART  Results for orders placed or performed during the hospital encounter of 02/20/16   CBC with differential   Result Value Ref Range    WBC 8.76 3.50 - 10.80 x10 3/uL    Hgb 13.3 13.0 - 17.0 g/dL    Hematocrit 86.5 (L) 42.0 - 52.0 %    Platelets 287 140 - 400 x10 3/uL    RBC 4.38 (L) 4.70 - 6.00 x10 6/uL    MCV 92.2 80.0 - 100.0 fL    MCH 30.4 28.0 - 32.0 pg    MCHC 32.9 32.0 - 36.0 g/dL    RDW 14 12 - 15 %    MPV 10.9 9.4 - 12.3 fL    Neutrophils 53 None %  Lymphocytes Automated 34 None %    Monocytes 10 None %    Eosinophils Automated 2 None %    Basophils Automated 0 None %    Immature Granulocyte Unmeasured None %    Nucleated RBC Unmeasured 0 - 1 /100 WBC    Neutrophils Absolute 4.67 1.80 - 8.10 x10 3/uL    Abs Lymph Automated 2.99 0.50 - 4.40 x10 3/uL    Abs Mono Automated 0.88 0.00 - 1.20 x10 3/uL    Abs Eos Automated 0.19 0.00 - 0.70 x10 3/uL    Absolute Baso Automated 0.03 0.00 - 0.20 x10 3/uL    Absolute Immature Granulocyte Unmeasured 0 x10 3/uL   Comprehensive metabolic panel   Result Value Ref Range    Glucose 127 (H) 70 - 100 mg/dL    BUN 16.1 9.0 - 09.6 mg/dL    Creatinine 1.0 0.7 - 1.3 mg/dL    Sodium 045 409 - 811 mEq/L    Potassium 5.3 (H) 3.5 - 5.1 mEq/L    Chloride 102 100 - 111 mEq/L    CO2 21 (L) 22 - 29 mEq/L    Calcium 9.7 8.5 - 10.5 mg/dL    Protein, Total 8.3 6.0 - 8.3 g/dL    Albumin 3.4 (L) 3.5 - 5.0 g/dL    AST (SGOT) 30 5 - 34 U/L    ALT 23 0 - 55 U/L    Alkaline Phosphatase 87 38 - 106 U/L    Bilirubin, Total 0.6  0.2 - 1.2 mg/dL    Globulin 4.9 (H) 2.0 - 3.6 g/dL    Albumin/Globulin Ratio 0.7 (L) 0.9 - 2.2    Anion Gap 15.0 5.0 - 15.0   B-type Natriuretic Peptide   Result Value Ref Range    B-Natriuretic Peptide 311 (H) 0 - 100 pg/mL   Troponin I   Result Value Ref Range    Troponin I 0.03 0.00 - 0.09 ng/mL   GFR   Result Value Ref Range    EGFR >60.0    PT/APTT   Result Value Ref Range    PT 14.3 12.6 - 15.0 sec    PT INR 1.1     PT Anticoag. Given Within 48 hrs. Unknown     PTT 53 (H) 23 - 37 sec   Cell MorpHology   Result Value Ref Range    Cell Morphology: Normal     Platelet Estimate Normal    ECG 12 Lead   Result Value Ref Range    Ventricular Rate 72 BPM    Atrial Rate 72 BPM    P-R Interval 130 ms    QRS Duration 94 ms    Q-T Interval 416 ms    QTC Calculation (Bezet) 455 ms    P Axis 77 degrees    R Axis 32 degrees    T Axis 152 degrees           DISPOSITION    DIAGNOSIS:  1. Syncope and collapse    2. Ischemic cardiomyopathy    3. Head injury, initial encounter    4. AICD (automatic cardioverter/defibrillator) present        DISPOSITION:  ED Disposition     Observation Admitting Physician: Judson Roch [[id number]]  Diagnosis: Syncope and collapse [780.2.ICD-9-CM]  Estimated Length of Stay: < 2 midnights  Tentative Discharge Plan?: Home or Self Care [1]  Patient Class: Observation [104]            PRESCRIPTIONS:  Patient's Medications  New Prescriptions    No medications on file   Previous Medications    AMLODIPINE-VALSARTAN (EXFORGE) 10-320 MG PER TABLET    Take 1 tablet by mouth daily.    ASPIRIN 81 MG CHEWABLE TABLET    Chew 1 tablet (81 mg total) by mouth daily.    BUTALBITAL-ACETAMINOPHEN-CAFFEINE (FIORICET, ESGIC) 50-325-40 MG PER TABLET    Take 1 tablet by mouth every 4 (four) hours as needed.    CARVEDILOL (COREG) 25 MG TABLET    TAKE 1 TABLET BY MOUTH TWICE A DAY WITH MEALS    FAMOTIDINE (PEPCID) 20 MG TABLET    TAKE 1 TABLET BY MOUTH TWICE A DAY    FLUTICASONE-SALMETEROL (ADVAIR DISKUS) 100-50 MCG/DOSE  AEROSOL POWDER, BREATH ACTIVTIVATEDE    Inhale 1 puff into the lungs 2 (two) times daily.    FUROSEMIDE (LASIX) 40 MG TABLET    Take 1 tablet (40 mg total) by mouth daily.    HYDROCODONE-CHLORPHENIRAMINE (TUSSIONEX) 10-8 MG/5ML SUSPENSION    Take 5 mLs by mouth 2 (two) times daily.    LANTUS SOLOSTAR 100 UNIT/ML INJECTION PEN    INJECT 40 TO 50 UNITS DAILY PER TESTS AS DIRECTED ONCE A DAY SUBCUTANEOUS 90 DAYS    LEVOFLOXACIN (LEVAQUIN) 500 MG TABLET    Take 1 tablet (500 mg total) by mouth daily.    NIACIN (NIASPAN) 500 MG CR TABLET    TAKE 1 TABLET BY MOUTH EVERY DAY AT BEDTIME    ONE TOUCH ULTRA TEST TEST STRIP    TEST BLOOD SUGAR 2-3 TIMES A DAY    SITAGLIPTIN-METFORMIN (JANUMET) 50-1000 MG PER TABLET    Take 1 tablet by mouth 2 (two) times daily with meals.    SPIRONOLACTONE (ALDACTONE) 25 MG TABLET       Modified Medications    No medications on file   Discontinued Medications    No medications on file         Dr. Altamease Oiler  Dr. Marnette Burgess recommends [facility]. Jude interrogation  [phone]    Shela Nevin, MD  02/20/16 1755    Shela Nevin, MD  02/20/16 1800

## 2016-02-20 NOTE — Plan of Care (Signed)
Problem: Safety  Goal: Patient will be free from injury during hospitalization  Outcome: Progressing  Hourly rounding. Call light and side table in reaching distance. Nonskid socks on. Side rails up. Wife at bedside  Intervention: Assess patient's risk for falls and implement fall prevention plan of care per policy  .  Intervention: Provide and maintain safe environment  .  Intervention: Include patient/family/caregiver in decisions related to safety  .  Intervention: Hourly rounding.  Marland Kitchen

## 2016-02-21 ENCOUNTER — Other Ambulatory Visit (INDEPENDENT_AMBULATORY_CARE_PROVIDER_SITE_OTHER): Payer: Self-pay

## 2016-02-21 ENCOUNTER — Observation Stay: Payer: Commercial Managed Care - POS

## 2016-02-21 DIAGNOSIS — I10 Essential (primary) hypertension: Secondary | ICD-10-CM

## 2016-02-21 DIAGNOSIS — Z794 Long term (current) use of insulin: Secondary | ICD-10-CM

## 2016-02-21 DIAGNOSIS — E119 Type 2 diabetes mellitus without complications: Secondary | ICD-10-CM

## 2016-02-21 DIAGNOSIS — I5022 Chronic systolic (congestive) heart failure: Secondary | ICD-10-CM

## 2016-02-21 DIAGNOSIS — R55 Syncope and collapse: Secondary | ICD-10-CM

## 2016-02-21 LAB — CBC AND DIFFERENTIAL
Basophils Absolute Automated: 0.02 10*3/uL (ref 0.00–0.20)
Basophils Automated: 0 %
Eosinophils Absolute Automated: 0.19 10*3/uL (ref 0.00–0.70)
Eosinophils Automated: 2 %
Hematocrit: 38.3 % — ABNORMAL LOW (ref 42.0–52.0)
Hgb: 12.3 g/dL — ABNORMAL LOW (ref 13.0–17.0)
Immature Granulocytes Absolute: 0.03 10*3/uL
Immature Granulocytes: 0 %
Lymphocytes Absolute Automated: 3.53 10*3/uL (ref 0.50–4.40)
Lymphocytes Automated: 38 %
MCH: 29.9 pg (ref 28.0–32.0)
MCHC: 32.1 g/dL (ref 32.0–36.0)
MCV: 93.2 fL (ref 80.0–100.0)
MPV: 10.4 fL (ref 9.4–12.3)
Monocytes Absolute Automated: 0.86 10*3/uL (ref 0.00–1.20)
Monocytes: 9 %
Neutrophils Absolute: 4.74 10*3/uL (ref 1.80–8.10)
Neutrophils: 51 %
Platelets: 264 10*3/uL (ref 140–400)
RBC: 4.11 10*6/uL — ABNORMAL LOW (ref 4.70–6.00)
RDW: 15 % (ref 12–15)
WBC: 9.34 10*3/uL (ref 3.50–10.80)

## 2016-02-21 LAB — LIPID PANEL
Cholesterol / HDL Ratio: 4.8
Cholesterol: 121 mg/dL (ref 0–199)
HDL: 25 mg/dL — ABNORMAL LOW (ref 40–9999)
LDL Calculated: 74 mg/dL (ref 0–99)
Triglycerides: 109 mg/dL (ref 34–149)
VLDL Calculated: 22 mg/dL (ref 10–40)

## 2016-02-21 LAB — GLUCOSE WHOLE BLOOD - POCT
Whole Blood Glucose POCT: 215 mg/dL — ABNORMAL HIGH (ref 70–100)
Whole Blood Glucose POCT: 227 mg/dL — ABNORMAL HIGH (ref 70–100)

## 2016-02-21 LAB — BASIC METABOLIC PANEL
Anion Gap: 9 (ref 5.0–15.0)
BUN: 10.8 mg/dL (ref 9.0–28.0)
CO2: 23 mEq/L (ref 22–29)
Calcium: 8.8 mg/dL (ref 8.5–10.5)
Chloride: 104 mEq/L (ref 100–111)
Creatinine: 0.9 mg/dL (ref 0.7–1.3)
Glucose: 178 mg/dL — ABNORMAL HIGH (ref 70–100)
Potassium: 4.4 mEq/L (ref 3.5–5.1)
Sodium: 136 mEq/L (ref 136–145)

## 2016-02-21 LAB — MAGNESIUM: Magnesium: 1.9 mg/dL (ref 1.6–2.6)

## 2016-02-21 LAB — GFR: EGFR: >60

## 2016-02-21 LAB — HEMOLYSIS INDEX: Hemolysis Index: 44 — ABNORMAL HIGH (ref 0–18)

## 2016-02-21 LAB — TROPONIN I: Troponin I: 0.03 ng/mL (ref 0.00–0.09)

## 2016-02-21 MED ORDER — CARBAMIDE PEROXIDE 6.5 % OT SOLN
5.0000 [drp] | Freq: Two times a day (BID) | OTIC | Status: DC
Start: 2016-02-21 — End: 2016-11-09

## 2016-02-21 MED ORDER — ALBUTEROL-IPRATROPIUM 2.5-0.5 (3) MG/3ML IN SOLN
3.0000 mL | Freq: Four times a day (QID) | RESPIRATORY_TRACT | Status: DC
Start: 2016-02-21 — End: 2016-02-21
  Administered 2016-02-21 (×2): 3 mL via RESPIRATORY_TRACT
  Filled 2016-02-21: qty 3

## 2016-02-21 MED ORDER — VALSARTAN 160 MG PO TABS
160.0000 mg | ORAL_TABLET | Freq: Two times a day (BID) | ORAL | Status: DC
Start: 2016-02-21 — End: 2016-07-05

## 2016-02-21 MED ORDER — NIACIN ER (ANTIHYPERLIPIDEMIC) 500 MG PO TBCR
500.0000 mg | EXTENDED_RELEASE_TABLET | Freq: Once | ORAL | Status: AC
Start: 2016-02-21 — End: 2016-02-21
  Administered 2016-02-21: 500 mg via ORAL

## 2016-02-21 MED ORDER — LEVOFLOXACIN 500 MG PO TABS
750.0000 mg | ORAL_TABLET | Freq: Every day | ORAL | Status: DC
Start: 2016-02-21 — End: 2016-02-21
  Administered 2016-02-21: 750 mg via ORAL
  Filled 2016-02-21 (×2): qty 1

## 2016-02-21 MED ORDER — PATIENT SUPPLIED NON FORMULARY
500.0000 mg | Freq: Once | Status: DC
Start: 2016-02-21 — End: 2016-02-21

## 2016-02-21 MED ORDER — RISAQUAD PO CAPS
1.0000 | ORAL_CAPSULE | Freq: Every day | ORAL | Status: DC
Start: 2016-02-21 — End: 2016-02-21
  Administered 2016-02-21: 1 via ORAL

## 2016-02-21 MED ORDER — RISAQUAD PO CAPS
1.0000 | ORAL_CAPSULE | Freq: Every day | ORAL | Status: DC
Start: 2016-02-21 — End: 2016-02-25

## 2016-02-21 NOTE — Consults (Signed)
[facility] CARDIOLOGY CONSULTATION REPORT  [facility]    Date Time: 02/21/2016 9:34 AM  Patient Name: [name] [name]  Requesting Physician: Brynda Rim, MD           History:   [name] [name] is a 70 y.o. male admitted on 02/20/2016, for whom we are asked to provide cardiac consultation, regarding syncope. He presented with 2 episodes of syncope preceded by lightheadedness. He did not have chest pain, sob, palpitations, nausea, vomiting, diarrhea, icd shocks, low bp or poor appetite. He has been treated for a pneumonia recently but is still sob and wheezy. His last icd check is 2015.     Past Medical History:     Past Medical History   Diagnosis Date   . Heart attack 2011     and 2014   . Diabetes mellitus    . Hypertension    . Cardiac defibrillator in situ    . CAD (coronary artery disease)    . Ischemic cardiomyopathy      20%   . CHF (congestive heart failure) 08/12/2014   . AICD (automatic cardioverter/defibrillator) present 2014       Past Surgical History:     Past Surgical History   Procedure Laterality Date   . Bring back open heart     . Hernia repair       LIH   . Duodenal ulcer  1973   . Orthopedic surgery       R foot corrective   . Tonsillectomy and adenoidectomy  1954   . Egd N/A 08/15/2014     Procedure: EGD;  Surgeon: Colon Branch, MD;  Location[facility];  Service: Gastroenterology;  Laterality: N/A;  egd w/ bx       Family History:     Family History   Problem Relation Age of Onset   . Breast cancer Mother    . Heart attack Mother 70   . Diabetes Brother        Social History:     Social History     Social History   . Marital Status: Married     Spouse Name: Lajoyce Corners   . Number of Children: 0   . Years of Education: N/A     Occupational History   . 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 Smoking status: Never Smoker    . Smokeless tobacco: Never Used   . Alcohol Use: No   . Drug Use: No   . Sexual Activity: Yes     Other Topics Concern   .  Not on file     Social History Narrative       Allergies:   No Known Allergies    Medications:     No current facility-administered medications on file prior to encounter.     Current Outpatient Prescriptions on File Prior to Encounter   Medication Sig Dispense Refill   . carvedilol (COREG) 25 MG tablet TAKE 1 TABLET BY MOUTH TWICE A DAY WITH MEALS 60 tablet 0   . hydrocodone-chlorpheniramine (TUSSIONEX) 10-8 MG/5ML suspension Take 5 mLs by mouth 2 (two) times daily. 115 mL 0   . levoFLOXacin (LEVAQUIN) 500 MG tablet Take 1 tablet (500 mg total) by mouth daily. 7 tablet 0   . SITagliptin-metFORMIN (JANUMET) 50-1000 MG per tablet Take 1 tablet by mouth 2 (two) times daily with meals. 180 tablet 3   . spironolactone (ALDACTONE)  25 MG tablet      . amlodipine-valsartan (EXFORGE) 10-320 MG per tablet Take 1 tablet by mouth daily. 90 tablet 3   . aspirin 81 MG chewable tablet Chew 1 tablet (81 mg total) by mouth daily.  0   . butalbital-acetaminophen-caffeine (FIORICET, ESGIC) 50-325-40 MG per tablet Take 1 tablet by mouth every 4 (four) hours as needed. 10 tablet 0   . famotidine (PEPCID) 20 MG tablet TAKE 1 TABLET BY MOUTH TWICE A DAY 60 tablet 0   . fluticasone-salmeterol (ADVAIR DISKUS) 100-50 MCG/DOSE Aerosol Powder, Breath Activtivatede Inhale 1 puff into the lungs 2 (two) times daily. 1 each 5   . furosemide (LASIX) 40 MG tablet Take 1 tablet (40 mg total) by mouth daily. 30 tablet 0   . LANTUS SOLOSTAR 100 UNIT/ML injection pen INJECT 40 TO 50 UNITS DAILY PER TESTS AS DIRECTED ONCE A DAY SUBCUTANEOUS 90 DAYS 15 pen 3   . niacin (NIASPAN) 500 MG CR tablet TAKE 1 TABLET BY MOUTH EVERY DAY AT BEDTIME  3   . ONE TOUCH ULTRA TEST test strip TEST BLOOD SUGAR 2-3 TIMES A DAY 300 each 1         Review of Systems:    Comprehensive review of systems including constitutional, eyes, ears, nose, mouth, throat, cardiovascular, GI, GU, musculoskeletal, integumentary, respiratory, neurologic, psychiatric, and endocrine is  negative other than what is mentioned already in the history of present illness    Physical Exam:     Filed Vitals:    02/21/16 0929   BP: 117/70   Pulse: 73   Temp: 96.2 F (35.7 C)   Resp: 20   SpO2: 97%     Temp (24hrs), Avg:97.2 F (36.2 C), Min:96.2 F (35.7 C), Max:97.8 F (36.6 C)      GENERAL: Patient is in no acute distress   HEENT: No conjunctival lesions, no pallor of the oral mucosa  NECK: No jugular venous distention, normal carotid upstrokes without bruits   CARDIAC: Normal apical impulse, regular rate and rhythm, with normal S1 and S2, no murmurs, rubs, or gallops   CHEST: wheezes bilaterally,  normal respiratory effort  ABDOMEN: No abdominal bruits, soft, nontender, no increased abdominal aorta  EXTREMITIES: No clubbing, cyanosis, or edema, 2+ femoral pulses bilaterally without bruits  SKIN: No lesions  NEUROLOGIC: Alert and oriented to time, place and person, normal mood and affect  MUSCULOSKELETAL: Normal muscle strength and tone.      Labs Reviewed:       Recent Labs  Lab 02/21/16  0012 02/20/16  2148 02/20/16  1340   TROPONIN I 0.03 0.03 0.03               Recent Labs  Lab 02/20/16  1340   ALT 23   AST (SGOT) 30       Recent Labs  Lab 02/21/16  0535   MAGNESIUM 1.9       Recent Labs  Lab 02/20/16  1425   PT 14.3   PT INR 1.1   PTT 53*       Recent Labs  Lab 02/21/16  0535 02/20/16  2148 02/20/16  1340   WBC 9.34 9.81 8.76   HGB 12.3* 13.0 13.3   HEMATOCRIT 38.3* 39.4* 40.4*   PLATELETS 264 305 287       Recent Labs  Lab 02/21/16  0535 02/20/16  1340   SODIUM 136 138   POTASSIUM 4.4 5.3*   CHLORIDE 104 102  CO2 23 21*   BUN 10.8 12.0   CREATININE 0.9 1.0   EGFR >60.0 >60.0   GLUCOSE 178* 127*     Lab Results   Component Value Date    BNP 311* 02/20/2016     Estimated Creatinine Clearance: 85 mL/min (based on Cr of 0.9).  EKG: normal sinus rhythm, 1st degree AV block, lateral t wave changes. .  Radiology      Radiology Results (24 Hour)     Procedure Component Value Units Date/Time    XR Chest  2 Views [[id number]] Collected:  02/20/16 1427    Order Status:  Completed Updated:  02/20/16 1434    Narrative:      Clinical History: Syncope.    Findings: PA and lateral views of the chest. Compared with prior studies  including 12/29/2013 and 02/14/2016.    Mild tortuosity of the thoracic aorta. Single lead pacemaker wire tip  lies over or just beyond the level of the cavoatrial junction and is  unchanged in position. Cardiac silhouette is not enlarged. Pulmonary  vascularity is normal.     Bilateral peribronchial thickening compatible with nonspecific  bronchiolitis. New patchy bilateral lower lobe airspace disease, more  pronounced on the left than right, suspicious for pneumonia. No pleural  effusion.    Mild rightward thoracolumbar spinal curvature and multilevel  spondylosis.      Impression:       Bronchiolitis and new lower lobe airspace disease suspicious  for pneumonia. Recommend short-term follow study to confirm resolution.    Please see above for additional findings.    Darra Lis, MD   02/20/2016 2:30 PM      CT Head WO Contrast [[id number]] Collected:  02/20/16 1404    Order Status:  Completed Updated:  02/20/16 1411    Narrative:      HISTORY: Syncope, trauma    TECHNIQUE: Axial CT images of the head were obtained, without  intravenous contrast.    The following dose reduction techniques were utilized: automated  exposure control and/or adjustment of the mA and/or kV according to  patient size, and the use of iterative reconstruction technique.    COMPARISON: 10/27/2010    FINDINGS: There are mild periventricular hypodensities, suggesting  chronic small vessel ischemic changes. Atherosclerotic vascular  calcifications are identified. There is no mass effect or midline shift.  There is no evidence of an acute intracranial hemorrhage or territorial  infarct. The osseous structures are unremarkable. There are inflammatory  changes in the visualized paranasal sinuses.      Impression:         1.  No acute intracranial hemorrhage or mass effect.  2. Mild chronic appearing small vessel ischemic changes.    Nicoletta Dress, MD   02/20/2016 2:07 PM          Assessment:    Syncope: etiology?   [employer] icd with last check 6/15   nicmp ef of 25%: euvolemic   Dm   htn   Nonocclusive cad by casth 3/14   Pneumonia    Recommendations:    ICD  Check   Check echo   Iv abx            Signed by: Marian Sorrow, M.D., [facility]      [facility]  NP Spectral[employer] [phone] (8am-5pm)  MD Spectralink [phone] (8am-5pm)  After hours, non urgent consult line [phone]  After Hours, urgent consults [phone]

## 2016-02-21 NOTE — H&P (Signed)
[name] HOSPITALIST  H&P    Patient Info:   Date Time: 02/21/2016  12:03 AM   Patient Name:[name] [name]   ZOX:[id number]    PCP: Zorita Pang, MD   Admit Date:02/20/2016   Attending Physician:Horani, Rami, MD      Assessment and Plan:   1.  Syncope and Collapse, patient has had 2 separate episodes of syncope once Wednesday evening and again Thursday afternoon, possibly secondary to cardiac etiology verses orthostatic hypotension versus vasovagal.  Patient has automatic implantable cardiac defibrillator in place.  Has not had interrogation for approximately 2 years.    [facility] consulted by ED,  appreciate recommendations.   Fall precautions   Orthostatic blood pressures    2.  History of recent pneumonia.  Per patient, he initially was treated for bronchitis with Augmentin in March, however, symptoms have persisted and he was recently placed on Levaquin.  A chest x-ray shows Bronchiolitis and new lower lobe airspace disease suspicious for pneumonia, patient's white blood cell count is within normal limits.  He denies fever or chills.     We will continue to monitor for now.   Duo nebulizers every 6 hours as needed     3.  Type 2 diabetes mellitus, patient's most recent hemoglobin A1c 8.6 on January 15, 2016.  History takes 40 units of Lantus at home, we will continue while here and start low-dose sliding scale insulin and monitor blood sugars before meals, at bedtime.  We will hold metformin, sitagliptin for now     4.  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  Continue Coreg, Lasix, Norvasc and valsartan.  We will consult  heart, appreciate recommendations.    5.  Hypertension, chronic.  Patient's blood pressure appears well controlled at this time, currently 139/68.  Patient takes Exforge at home, however we do not carry combination dose.  We will continue Norvasc 10 mg by mouth daily,  Diovan 320 mg by mouth daily    6.  History of congestive heart failure, patient does not appear to be in acute exacerbation at this time.  BNP is elevated at 311, continue Lasix 40 mg by mouth daily .  Monitor I's and O's, daily weights. Potassium is elevated at 5.3.  We will hold spirolactone for now     7.  History of an Coronary artery disease, and non-STEMI in 2011.  Continue Coreg, aspirin      Hospital Problems:  Active Problems:    Diabetes mellitus    Heart attack    CAD (coronary artery disease)    Non-ischemic cardiomyopathy    CHF (congestive heart failure)    Essential hypertension    Pneumonia due to infectious organism, unspecified laterality, unspecified part of lung    Syncope and collapse    AICD (automatic cardioverter/defibrillator) present    Syncope, unspecified syncope type     DVT Prohylaxis:lovenox    Code Status: Full Code   Disposition:home   Condition on admission and Prognosis: Stable    Type of Admission:Observation   Estimated Length of Stay (including stay in the ER receiving treatment): Less than 2 midnights    Medical Necessity for stay: Syncope        Clinical Presentation   History of Presenting Illness:   [name] [name] is a 70 y.o. male who has history of   Past Surgical History   Procedure Laterality Date   . Bring back open heart     .  Hernia repair       LIH   . Duodenal ulcer  1973   . Orthopedic surgery       R foot corrective   . Tonsillectomy and adenoidectomy  1954   . Egd N/A 08/15/2014     Procedure: EGD;  Surgeon: Colon Branch, MD;  [facility];  Service: Gastroenterology;  Laterality: N/A;  egd w/ bx    Past Medical History   Diagnosis Date   . Heart attack 2011     and 2014   . Diabetes mellitus    . Hypertension    . Cardiac defibrillator in situ    . CAD (coronary artery disease)    . Ischemic cardiomyopathy      20%   . CHF (congestive heart failure) 08/12/2014   . AICD (automatic cardioverter/defibrillator) present 2014     This  70 year old male with past medical history of hypertension, diabetes mellitus non-STEMI.  Coronary artery disease and nonischemic cardiomyopathy with automatic implantable cardiac defibrillator placement, presents to the ED with syncope.  Patient reports he has had 2 episodes of syncope.  The 1st episode of syncope ws Wednesday evening, at that time patient was standing up from the laying down position in the bed and syncopized, he is unsure for how long his was down, he recalls he woke up and wondered what happened.  Prior to syncopal episode patient reports he did feel dizzy.  The 2nd episode occurred this afternoon while in the bathroom, at that time, he was cleaning his left ear out with a Q-tip when he had a syncopal episode, prior to syncopal episode patient recalls he again felt dizzy .  During both episode patient believes he may have hit his head.  Patient denies chest pain or palpitations, he does have automatic implantable cardiac defibrillator in place, denies any shocks.  Patient does have past medical history of Congestive heart failure, patient denies recent weight gain, swelling to bilateral lower extremities or increased shortness of breath  Patient has been recently treated for pneumonia.  Patient was diagnosed with bronchitis in March and treated with Augmentin, however, patient's symptoms of cough persisted, he was diagnosed with pneumonia April 28 and started on a course of Levaquin as well as Tussionex for the cough.  Patient reports taking medications as ordered.  Patient reports his cough has been getting better but does report a productive cough producing thick gray sputum.    Patient denies chest pain, shortness of breath, nausea, vomiting, diarrhea, frequency, urgency, hematuria, melena, hematochezia, numbness, tingling, weakness, focal deficits, fever, chills, recent travel     Review of Systems:  Review of Systems   Constitutional: Negative for fever, chills, weight loss, malaise/fatigue  and diaphoresis.   HENT: Negative for congestion, ear pain and sore throat.    Eyes: Negative for blurred vision, double vision and pain.   Respiratory: Positive for cough and sputum production. Negative for hemoptysis, shortness of breath, wheezing and stridor.    Cardiovascular: Negative for chest pain, palpitations, orthopnea, claudication, leg swelling and PND.   Gastrointestinal: Negative for heartburn, nausea, vomiting, abdominal pain, diarrhea, constipation, blood in stool and melena.   Genitourinary: Negative for dysuria, urgency, frequency, hematuria and flank pain.   Musculoskeletal: Negative for myalgias, back pain, joint pain, falls and neck pain.   Neurological: Positive for headaches. Negative for dizziness, tingling, tremors, sensory change, speech change, focal weakness, seizures, loss of consciousness and weakness.   Psychiatric/Behavioral: Negative for depression and substance  abuse. The patient is not nervous/anxious.       Vitals:   Vitals reviewed temporal artery temperature is 97.3 F (36.3 C). His blood pressure is 139/68 and his pulse is 80. His respiration is 19 and oxygen saturation is 98%. There is no weight on file to calculate BMI.  Filed Vitals:    02/20/16 1534 02/20/16 1724 02/20/16 1950 02/20/16 2300   BP: 134/90 138/92 134/88 139/68   Pulse: 88 79 77 80   Temp:   97.8 F (36.6 C) 97.3 F (36.3 C)   TempSrc:   Temporal Artery Temporal Artery   Resp: 15 16 16 19    SpO2: 96% 96% 97% 98%     Intake and Output Summary (Last 24 hours) at Date Time   Intake/Output Summary (Last 24 hours) at 02/21/16 0003  Last data filed at 02/20/16 2300   Gross per 24 hour   Intake      0 ml   Output    200 ml   Net   -200 ml      Physical Exam:   Physical Exam   Constitutional: He is oriented to person, place, and time and well-developed, well-nourished, and in no distress. No distress.   HENT:   Head: Normocephalic and atraumatic.   Right Ear: External ear normal.   Left Ear: External ear normal.  Decreased hearing is noted.   Nose: Nose normal.   Mouth/Throat: Oropharynx is clear and moist. No oropharyngeal exudate.   Eyes: Conjunctivae and EOM are normal. Pupils are equal, round, and reactive to light. Right eye exhibits no discharge. Left eye exhibits no discharge. No scleral icterus.   Neck: Normal range of motion. Neck supple. No JVD present. No tracheal deviation present.   Cardiovascular: Normal rate, regular rhythm, normal heart sounds and intact distal pulses.  Exam reveals no gallop and no friction rub.    No murmur heard.  Pulmonary/Chest: Effort normal. No stridor. No respiratory distress. He has no wheezes. He has rhonchi in the right middle field, the right lower field, the left middle field and the left lower field. He has no rales. He exhibits no tenderness.   Abdominal: Soft. Bowel sounds are normal. He exhibits no distension and no mass. There is no tenderness. There is no rebound and no guarding.   Musculoskeletal: Normal range of motion. He exhibits no edema or tenderness.   Lymphadenopathy:     He has no cervical adenopathy.   Neurological: He is alert and oriented to person, place, and time. He has normal reflexes. He displays normal reflexes. No cranial nerve deficit. He exhibits normal muscle tone. Coordination normal. GCS score is 15.   Skin: Skin is warm and dry. No rash noted. He is not diaphoretic. No erythema. No pallor.   Psychiatric: Mood, memory, affect and judgment normal.          Clinical Information   Chief Complaint:  Chief Complaint   Patient presents with   . Loss of Consciousness   . Dizziness      Past Medical History:  Past Medical History   Diagnosis Date   . Heart attack 2011     and 2014   . Diabetes mellitus    . Hypertension    . Cardiac defibrillator in situ    . CAD (coronary artery disease)    . Ischemic cardiomyopathy      20%   . CHF (congestive heart failure) 08/12/2014   . AICD (automatic cardioverter/defibrillator) present 2014  Past Surgical  History:  Past Surgical History   Procedure Laterality Date   . Bring back open heart     . Hernia repair       LIH   . Duodenal ulcer  1973   . Orthopedic surgery       R foot corrective   . Tonsillectomy and adenoidectomy  1954   . Egd N/A 08/15/2014     Procedure: EGD;  Surgeon: Colon Branch, MD;  Location: [facility];  Service: Gastroenterology;  Laterality: N/A;  egd w/ bx      Family History:  Family History   Problem Relation Age of Onset   . Breast cancer Mother    . Heart attack Mother 53   . Diabetes Brother       Social History:  History   Alcohol Use No     History   Drug Use No     History   Smoking status   . Never Smoker    Smokeless tobacco   . Never Used     Social History     Social History   . Marital Status: Married     Spouse Name: Lajoyce Corners   . Number of Children: 0   . Years of Education: N/A     Occupational History   . 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 Smoking status: Never Smoker    . Smokeless tobacco: Never Used   . Alcohol Use: No   . Drug Use: No   . Sexual Activity: Yes     Other Topics Concern   . None     Social History Narrative      Allergies:No Known Allergies   Medications:  Prescriptions prior to admission   Medication Sig Dispense Refill Last Dose   . carvedilol (COREG) 25 MG tablet TAKE 1 TABLET BY MOUTH TWICE A DAY WITH MEALS 60 tablet 0 02/20/2016 at Unknown time   . hydrocodone-chlorpheniramine (TUSSIONEX) 10-8 MG/5ML suspension Take 5 mLs by mouth 2 (two) times daily. 115 mL 0 02/20/2016 at Unknown time   . levoFLOXacin (LEVAQUIN) 500 MG tablet Take 1 tablet (500 mg total) by mouth daily. 7 tablet 0 02/20/2016 at Unknown time   . SITagliptin-metFORMIN (JANUMET) 50-1000 MG per tablet Take 1 tablet by mouth 2 (two) times daily with meals. 180 tablet 3 02/20/2016 at Unknown time   . spironolactone (ALDACTONE) 25 MG tablet    02/20/2016 at Unknown time   . amlodipine-valsartan (EXFORGE) 10-320 MG per tablet Take 1 tablet by mouth daily. 90 tablet  3 Taking   . aspirin 81 MG chewable tablet Chew 1 tablet (81 mg total) by mouth daily.  0 Taking   . butalbital-acetaminophen-caffeine (FIORICET, ESGIC) 50-325-40 MG per tablet Take 1 tablet by mouth every 4 (four) hours as needed. 10 tablet 0 Taking   . famotidine (PEPCID) 20 MG tablet TAKE 1 TABLET BY MOUTH TWICE A DAY 60 tablet 0 Taking   . fluticasone-salmeterol (ADVAIR DISKUS) 100-50 MCG/DOSE Aerosol Powder, Breath Activtivatede Inhale 1 puff into the lungs 2 (two) times daily. 1 each 5 Taking   . furosemide (LASIX) 40 MG tablet Take 1 tablet (40 mg total) by mouth daily. 30 tablet 0 Taking   . LANTUS SOLOSTAR 100 UNIT/ML injection pen INJECT 40 TO 50 UNITS DAILY PER TESTS AS DIRECTED ONCE A DAY SUBCUTANEOUS 90 DAYS 15 pen 3 Taking   . niacin (NIASPAN) 500  MG CR tablet TAKE 1 TABLET BY MOUTH EVERY DAY AT BEDTIME  3 Taking   . ONE TOUCH ULTRA TEST test strip TEST BLOOD SUGAR 2-3 TIMES A DAY 300 each 1 Taking          Results of Labs/imaging   Labs have been reviewed:   Coagulation Profile:   Recent Labs  Lab 02/20/16  1425   PT 14.3   PT INR 1.1   PTT 53*        CBC review:   Recent Labs  Lab 02/20/16  2148 02/20/16  1340   WBC 9.81 8.76   HGB 13.0 13.3   HEMATOCRIT 39.4* 40.4*   PLATELETS 305 287   MCV 92.7 92.2   RDW 15 14   NEUTROPHILS  --  53   LYMPHOCYTES AUTOMATED  --  34   EOSINOPHILS AUTOMATED  --  2   IMMATURE GRANULOCYTE  --  Unmeasured   NEUTROPHILS ABSOLUTE  --  4.67   ABSOLUTE IMMATURE GRANULOCYTE  --  Unmeasured      Chem Review:  Recent Labs  Lab 02/20/16  1340   SODIUM 138   POTASSIUM 5.3*   CHLORIDE 102   CO2 21*   BUN 12.0   CREATININE 1.0   GLUCOSE 127*   CALCIUM 9.7   BILIRUBIN, TOTAL 0.6   AST (SGOT) 30   ALT 23   ALKALINE PHOSPHATASE 87      Results     Procedure Component Value Units Date/Time    Glucose Whole Blood - POCT [[id number]]  (Abnormal) Collected:  02/20/16 2124     POCT - Glucose Whole blood 150 (H) mg/dL Updated:  40/98/11 9147    Troponin I [[id number]] Collected:  02/20/16  2148    Specimen Information:  Blood Updated:  02/20/16 2223     Troponin I 0.03 ng/mL     CBC [[id number]]  (Abnormal) Collected:  02/20/16 2148    Specimen Information:  Blood from Blood Updated:  02/20/16 2155     WBC 9.81 x10 3/uL      Hgb 13.0 g/dL      Hematocrit 29.5 (L) %      Platelets 305 x10 3/uL      RBC 4.25 (L) x10 6/uL      MCV 92.7 fL      MCH 30.6 pg      MCHC 33.0 g/dL      RDW 15 %      MPV 10.6 fL     Narrative:      If not ordered in ED    PT/APTT [[id number]]  (Abnormal) Collected:  02/20/16 1425     PT 14.3 sec Updated:  02/20/16 1445     PT INR 1.1      PT Anticoag. Given Within 48 hrs. Unknown      PTT 53 (H) sec     Cell MorpHology [[id number]] Collected:  02/20/16 1340     Cell Morphology: Normal Updated:  02/20/16 1431     Platelet Estimate Normal     CBC with differential [[id number]]  (Abnormal) Collected:  02/20/16 1340    Specimen Information:  Blood from Blood Updated:  02/20/16 1431     WBC 8.76 x10 3/uL      Hgb 13.3 g/dL      Hematocrit 95.6 (L) %      Platelets 287 x10 3/uL      RBC 4.38 (L) x10 6/uL      MCV  92.2 fL      MCH 30.4 pg      MCHC 32.9 g/dL      RDW 14 %      MPV 10.9 fL      Neutrophils 53 %      Lymphocytes Automated 34 %      Monocytes 10 %      Eosinophils Automated 2 %      Basophils Automated 0 %      Immature Granulocyte Unmeasured %      Nucleated RBC Unmeasured /100 WBC      Neutrophils Absolute 4.67 x10 3/uL      Abs Lymph Automated 2.99 x10 3/uL      Abs Mono Automated 0.88 x10 3/uL      Abs Eos Automated 0.19 x10 3/uL      Absolute Baso Automated 0.03 x10 3/uL      Absolute Immature Granulocyte Unmeasured x10 3/uL     B-type Natriuretic Peptide [[id number]]  (Abnormal) Collected:  02/20/16 1340    Specimen Information:  Blood Updated:  02/20/16 1421     B-Natriuretic Peptide 311 (H) pg/mL     Troponin I [[id number]] Collected:  02/20/16 1340    Specimen Information:  Blood Updated:  02/20/16 1421     Troponin I 0.03 ng/mL     Comprehensive metabolic panel  [[id number]]  (Abnormal) Collected:  02/20/16 1340    Specimen Information:  Blood Updated:  02/20/16 1421     Glucose 127 (H) mg/dL      BUN 08.6 mg/dL      Creatinine 1.0 mg/dL      Sodium 578 mEq/L      Potassium 5.3 (H) mEq/L      Chloride 102 mEq/L      CO2 21 (L) mEq/L      Calcium 9.7 mg/dL      Protein, Total 8.3 g/dL      Albumin 3.4 (L) g/dL      AST (SGOT) 30 U/L      ALT 23 U/L      Alkaline Phosphatase 87 U/L      Bilirubin, Total 0.6 mg/dL      Globulin 4.9 (H) g/dL      Albumin/Globulin Ratio 0.7 (L)      Anion Gap 15.0     GFR [[id number]] Collected:  02/20/16 1340     EGFR >60.0 Updated:  02/20/16 1421         Radiology reports have been reviewed:  Radiology Results (24 Hour)     Procedure Component Value Units Date/Time    XR Chest 2 Views [[id number]] Collected:  02/20/16 1427    Order Status:  Completed Updated:  02/20/16 1434    Narrative:      Clinical History: Syncope.    Findings: PA and lateral views of the chest. Compared with prior studies  including 12/29/2013 and 02/14/2016.    Mild tortuosity of the thoracic aorta. Single lead pacemaker wire tip  lies over or just beyond the level of the cavoatrial junction and is  unchanged in position. Cardiac silhouette is not enlarged. Pulmonary  vascularity is normal.     Bilateral peribronchial thickening compatible with nonspecific  bronchiolitis. New patchy bilateral lower lobe airspace disease, more  pronounced on the left than right, suspicious for pneumonia. No pleural  effusion.    Mild rightward thoracolumbar spinal curvature and multilevel  spondylosis.      Impression:       Bronchiolitis and  new lower lobe airspace disease suspicious  for pneumonia. Recommend short-term follow study to confirm resolution.    Please see above for additional findings.    Darra Lis, MD   02/20/2016 2:30 PM      CT Head WO Contrast [[id number]] Collected:  02/20/16 1404    Order Status:  Completed Updated:  02/20/16 1411    Narrative:      HISTORY:  Syncope, trauma    TECHNIQUE: Axial CT images of the head were obtained, without  intravenous contrast.    The following dose reduction techniques were utilized: automated  exposure control and/or adjustment of the mA and/or kV according to  patient size, and the use of iterative reconstruction technique.    COMPARISON: 10/27/2010    FINDINGS: There are mild periventricular hypodensities, suggesting  chronic small vessel ischemic changes. Atherosclerotic vascular  calcifications are identified. There is no mass effect or midline shift.  There is no evidence of an acute intracranial hemorrhage or territorial  infarct. The osseous structures are unremarkable. There are inflammatory  changes in the visualized paranasal sinuses.      Impression:         1. No acute intracranial hemorrhage or mass effect.  2. Mild chronic appearing small vessel ischemic changes.    Nicoletta Dress, MD   02/20/2016 2:07 PM           EKG: EKG reviewed , normal sinus rhythm, 72 bpm, no acute ST changes noted           Hospitalist   Signed by: Patria Mane Tamieka Rancourt   02/21/2016 12:03 AM

## 2016-02-21 NOTE — Discharge Instructions (Signed)
For the next few days, please have someone with you all the time. I have held one of your BP medicines. You need to follow up with your PCP on Monday.   Make sure you do not dehydrate yourself yet dont drink too much fluids. May be 1.5 L per day.   You need to follow up with your cardiologist for your low BP when you are standing up.     [email or web address] Program    Education about New Medications and their Side effects    Dear [name] [name],    Its been a pleasure taking care of you during your hospitalization here at [facility]. We have initiated a new program to educate our patients and/or their family members or designated personnel about the new medications started by your physicians and their indications along with the possible side effects. Multiple studies have shown that patients started on new medications are often unaware of the names of the medication along with the indications and their side effects which leads to decreased compliance with the medications.    During our conversation today on 02/21/2016  3:57 PM I have explained to you the name of the new medication and the indication along with some possible common side effects. Listed below are some of the new medications started during this hospitalization.     Please call the Nurse if you have any side effects while in hospital.     Please call [phone] if you have any life threatening symptoms after you are discharged from the hospital.    Please inform your Primary care physician for common side effects which are not life threatening after discharge.    Medication Name: Valsartan(Diovan)   This Medication is used for:   Blood Pressure   Congestive Heart Failure     Common Side Effects are:   Dizziness   Low Blood Pressure    Increased Potassium     A note from your nurse:  Call your nurse immediately if you notice itching, hives, swelling or trouble breathing     Medication: Lactobacillus(Risaquad)   This Medication is used for:   To  promote good bacteria in the intestines    Common Side Effects are:   Gas   Bloating    A note from your nurse:  Call your nurse immediately if you notice itching, hives, swelling or trouble breathing       Thank you for your time.    Donnisha Besecker [name], RN  02/21/2016  3:57 PM  [facility]  [address]  [place], [place]  [address]

## 2016-02-21 NOTE — Progress Notes (Signed)
02/21/16 0100 02/21/16 0105 02/21/16 0110   Vital Signs   Heart Rate 90 80 82   BP 148/90 mmHg (!) 148/91 mmHg 105/71 mmHg       Orthostatics done by tech. Pt denies dizziness or lightheadedness.

## 2016-02-21 NOTE — UM Notes (Signed)
Hello    REF# [id number]  Admission clinicals for OBSERVATION stay 02/20/16 - 02/21/16  C/b to [name] [phone]    Thank-you!      70 yo male to ED 02/20/16 1315. Pt states that while removing wax from ear last night, apparently had LOC, witnessed by spouse. Also had similar episode of dizziness with LOC @ 0230.   97.5, HR 88, RR 18, BP 138/92, sats 96%  PMH : heart attack, DM, HTN, CAD, cardiac defib, CHF  HCrit 40.4, RBC 4.38, Glucose 127, K 5.3, Co2 21, BNP 311  CT head : no acute abn  CXRY : Bronchiolitis and new lower lobe airspace disease suspicious for pneumonia.  ABN EKG : NORMAL SINUS RHYTHM WITH SINUS ARRHYTHMIA. ST & T WAVE ABNORMALITY, CONSIDER LATERAL ISCHEMIA.  On exam : + wheezing  In ED : 5 drops Carbamide peroxide L ear  Admit : Syncope and collapse w/ LOC, poss PNA    MD assessment/plan :  -Syncope-patient with cardiac risk factors. We will consult cardiology. Patient does have pulmonary issues as well. Part of it may be related to his coughing.  -Pneumonia-patient states he feels better once they switched his antibiotics to levofloxacin. We will continue the hospital. As patient is having wheezing on the L, will have the patient on breathing treatments.  -Type 2 diabetes mellitus-we will have the patient on sliding scale insulin the hospital  -Nonischemic cardiomyopathy-patient's last ejection fraction was 20 percent.  Patient has a automatic implantable cardiac defibrillator. We will continue his carvedilol, furosemide, amlodipine, valsartan. [place] heart will be involved. He is not showing any signs of overload, and his BNP is 311  -Hypertension-we will continue the above medications.      OBS admit order 02/20/16 1711, tele, DuoNeb q6, po Amlodipine, ASA, 5 drop L ear Carbamide peroxide BID, po Coreg, SQ Lovenox 40mg  qd, po Pepcid, Lasix, Risaquad, Levaquin, Niacin, Diovan, prn Tylenol, prn IV Morphine, prn IV Narcan, sliding scale insulin, Cardio          On tele 02/21/16 :  +  Orthostatics this am : SBP 148 lying - 148 sitting - to 105 standing  Still SOB and wheezy  C/o severe HA pain 7/10    96.2, HR 90, RR 21, BP to 140s/90s, sats 97% O2 2L NC  Glucose to 220s, H&H 12.3/38.3, RBC 4.11 - 2units + 2units SQ HumuLin given this am    Cardio consult 02/21/16 :  Assessment:    Syncope: etiology?   [employer] Jude icd with last check 6/15   nicmp ef of 25%: euvolemic   Dm   htn   Nonocclusive cad by casth 3/14   Pneumonia    Recommendations:    ICD Check   Check echo   Iv abx             ECHO 02/21/16 :  Dilated left ventricle with reduced left ventricular systolic function.  Estimated ejection fraction of 20%.  Mild to moderate mitral regurgitation.  Grade 2 diastolic dysfunction.  Mild pulmonary hypertension.

## 2016-02-21 NOTE — Plan of Care (Signed)
Tele removed. Saline loc removed. Reviewed discharge instructions with patient. Patient verbalized understanding of all discharge instructions. Copy of all discharge instructions given to patient. Patient discharged to home via private vehicle.

## 2016-02-21 NOTE — Progress Notes (Signed)
02/21/16 1256   Patient Type   Within 30 Days of Previous Admission? No   Medicare focused diagnosis patient? Not a Medicare focused diagnosis patient   Bundle patient? Not a bundle patient   Healthcare Decisions   Interviewed: Patient   Orientation/Decision Making Abilities of Patient Alert and Oriented x3, able to make decisions   Advance Directive Patient does not have advance directive   Healthcare Agent Appointed Yes   Healthcare Agent's Name Vitaliy Eisenhour wife   Healthcare Agent's Phone Number [phone]   Prior to admission   Prior level of function Independent with ADLs;Ambulates independently   Type of Residence Private residence   Home Layout Two level;Bed/bath upstairs   Living Arrangements Spouse/significant other   How do you get to your MD appointments? self; patient works full time, drives and is independent in all spheres   Dressing Independent   Grooming Independent   Feeding Independent   Bathing Independent   Toileting Independent   Home Care/Community Services None   Prior SNF admission? (Detail) no   Prior Rehab admission? (Detail) no   Discharge Planning   Support Systems Spouse/significant other   Patient expects to be discharged to: home with pcp appt   Anticipated [place] plan discussed with: Same as interviewed   Follow up appointment scheduled? Yes   Follow up appointment scheduled with: PCP   Mode of transportation: Private car (family member)   Consults/Providers   PT Evaluation Needed 2  (no)   OT Evalulation Needed 2  (no)   SLP Evaluation Needed 2  (no)   Outcome Palliative Care Screen Screened but did not meet criteria for intervention   Correct PCP listed in Epic? Yes   Important Message from [facility] Notice   Patient received 1st IMM Letter? Yes   Date of most recent IMM given: 02/20/16

## 2016-02-21 NOTE — Progress Notes (Signed)
02/21/16 1533   Discharge Disposition   Patient preference/choice provided? Yes   Physical Discharge Disposition Home, No Needs   Mode of Transportation Car   Patient/Family/POA notified of transfer plan Patient informed only   Patient agreeable to discharge plan/expected d/c date? Yes   Bedside nurse notified of transport plan? Yes   CM Interventions   Follow up appointment scheduled? Yes   Follow up appointment scheduled with: PCP   Referral made for home health RN visit? Does not meet home bound criteria   Multidisciplinary rounds/family meeting before d/c? Yes   Medicare Checklist   Is this a Medicare patient? Yes   Patient received 1st IMM Letter? Yes   If LOS 3 days or greater, did patient received 2nd IMM Letter? Yes

## 2016-02-21 NOTE — Discharge Summary (Signed)
[name] HOSPITALIST   [place] Summary   Patient Info:   Date/Time: 02/21/2016 / 2:48 PM   Admit Date:02/20/2016  Patient Name:[name] [name]   ZOX:[id number]   PCP: Zorita Pang, MD  Attending Physician:Ruth Kovich, Michiel Sites, MD     Hospital Course:   Please see H&P for complete details of HPI and ROS. The patient was admitted to [facility] and has been diagnosed with Hospital Problems:  Active Problems:    Diabetes mellitus    Heart attack    CAD (coronary artery disease)    Non-ischemic cardiomyopathy    CHF (congestive heart failure)    Essential hypertension    Pneumonia due to infectious organism, unspecified laterality, unspecified part of lung    Syncope and collapse    AICD (automatic cardioverter/defibrillator) present    Syncope, unspecified syncope type   and has been taken care as mentioned below.   70 year old male with past history of cardiomyopathy, status post automatic implantable cardiac defibrillator placement came in with a syncopal attack.  Patient was admitted, seen by cardiology, had the automatic implantable cardiac defibrillator interrogated cleared by cardiology.  Patient was hemodynamically stable and was stable to be discharged home with close outpatient follow-up with his cardiology.   Syncope: The 1st episode appeared to be most likely orthostatic.  Given his position.  The 2nd one, most likely related to positional.  Patient was hemodynamically stable during his hospital stay.  He did not have anymore episodes of the syncope.  He was ambulating without difficulty and was stable to be discharged home with close outpatient follow-up.   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  His ACD interrogation was unremarkable and he was deemed stable to be discharged home.   Diabetes mellitus type 2: Continued on his home medications.   Hypertension:  Blood pressure was fairly controlled and he was continued on his home medications.  Given the mild orthostatic changes.  He was asked to change the blood pressure medication from amlodipine was stopped on 10/322, just wants that done one 60 mg twice a day.  He was asked to follow-up with his PCP upon discharge for adjustment of the medications.   Recent history of pneumonia: Continue on the Levaquin for 7 more days.     Admission Date:02/20/2016  Discharge Date: 02/21/2016  Type of Admission:Observation  Code Status: Full Code  Subjective at the time of discharge:   Patient denied having any new complaints.  He denied having any dizziness while ambulating  Chief Complaint:  Loss of Consciousness and Dizziness    Objective:     Filed Vitals:    02/21/16 0300 02/21/16 0600 02/21/16 0929 02/21/16 1321   BP: 136/52 146/92 117/70 119/72   Pulse: 81 80 73 76   Temp: 97.4 F (36.3 C) 97.2 F (36.2 C) 96.2 F (35.7 C) 96.5 F (35.8 C)   TempSrc: Temporal Artery Temporal Artery Temporal Artery Temporal Artery   Resp: 20 21 20 20    Height:  1.829 m (6')     Weight:  91.178 kg (201 lb 0.2 oz)     SpO2: 97% 98% 97% 96%     Physical Exam:   Physical Exam patient seen and examined.  Vital signs reviewed  Clinical Presentation:   History of Presenting Illness: Please refer to HPI in the Detailed H&P  Discharge Diagnosis and Instructions:   Hospital Problems:Active Problems:  Diabetes mellitus    Heart attack    CAD (coronary artery disease)    Non-ischemic cardiomyopathy    CHF (congestive heart failure)    Essential hypertension    Pneumonia due to infectious organism, unspecified laterality, unspecified part of lung    Syncope and collapse    AICD (automatic cardioverter/defibrillator) present    Syncope, unspecified syncope type    Lists the present on admission hospital problems:Present on Admission:   . Syncope and collapse  . CAD (coronary artery disease)  . Diabetes mellitus  . Essential hypertension  . Non-ischemic  cardiomyopathy  . Pneumonia due to infectious organism, unspecified laterality, unspecified part of lung  . Heart attack  . CHF (congestive heart failure)  Consultants:Plan IP CONSULT TO CARDIOLOGY  Discharge Medications:   Discharge Medications      Discharge Medication List      Taking          aspirin 81 MG chewable tablet   Dose:  81 mg   Chew 1 tablet (81 mg total) by mouth daily.       butalbital-acetaminophen-caffeine 50-325-40 MG per tablet   Dose:  1 tablet   Commonly known as:  FIORICET, ESGIC   Take 1 tablet by mouth every 4 (four) hours as needed.       carbamide peroxide 6.5 % otic solution   Dose:  5 drop   Commonly known as:  DEBROX   Place 5 drops into the left ear 2 (two) times daily. Follow up with your PCP for cleaning on Monday.       carvedilol 25 MG tablet   Commonly known as:  COREG   TAKE 1 TABLET BY MOUTH TWICE A DAY WITH MEALS       famotidine 20 MG tablet   Commonly known as:  PEPCID   TAKE 1 TABLET BY MOUTH TWICE A DAY       fluticasone-salmeterol 100-50 MCG/DOSE Aepb   Dose:  1 puff   What changed:  when to take this   Commonly known as:  ADVAIR DISKUS   Inhale 1 puff into the lungs 2 (two) times daily.       hydrocodone-chlorpheniramine 10-8 MG/5ML suspension   Dose:  5 mL   Commonly known as:  TUSSIONEX   Take 5 mLs by mouth 2 (two) times daily.       insulin glargine 100 UNIT/ML injection pen   INJECT 40 TO 50 UNITS DAILY PER TESTS AS DIRECTED ONCE A DAY SUBCUTANEOUS 90 DAYS       lactobacillus/streptococcus Caps   Dose:  1 capsule   Take 1 capsule by mouth daily.       levoFLOXacin 500 MG tablet   Dose:  500 mg   Commonly known as:  LEVAQUIN   Take 1 tablet (500 mg total) by mouth daily.       niacin 500 MG CR tablet   Commonly known as:  NIASPAN   TAKE 1 TABLET BY MOUTH EVERY DAY AT BEDTIME       ONE TOUCH ULTRA TEST test strip   Generic drug:  glucose blood   TEST BLOOD SUGAR 2-3 TIMES A DAY       SITagliptin-metFORMIN 50-1000 MG per tablet   Dose:  1 tablet   Commonly known as:   JANUMET   Take 1 tablet by mouth 2 (two) times daily with meals.       spironolactone 25 MG tablet   Dose:  25 mg  Commonly known as:  ALDACTONE   Take 25 mg by mouth daily.       valsartan 160 MG tablet   Dose:  160 mg   Commonly known as:  DIOVAN   Take 1 tablet (160 mg total) by mouth 2 (two) times daily.         STOP taking these medications          amlodipine-valsartan 10-320 MG per tablet   Commonly known as:  EXFORGE           Follow up recommendations:   Follow up:         Follow-up Information     Follow up with Zorita Pang, MD. Go on 02/25/2016.    Specialty:  Family Medicine    Why:  This appointment has been scheduled for Tuesday at 5:30 pm    Contact information:    [address]  [address]  [place] [address]  [phone]          Follow up with Coralyn Pear, MD In 1 week.    Specialty:  Cardiology    Contact information:    [address]  [address]  [place] [address]  [phone]           Results of Labs/imaging:   Labs have been reviewed:   Coagulation Profile:   Recent Labs  Lab 02/20/16  1425   PT 14.3   PT INR 1.1   PTT 53*       CBC review:   Recent Labs  Lab 02/21/16  0535 02/20/16  2148 02/20/16  1340   WBC 9.34 9.81 8.76   HGB 12.3* 13.0 13.3   HEMATOCRIT 38.3* 39.4* 40.4*   PLATELETS 264 305 287   MCV 93.2 92.7 92.2   RDW 15 15 14    NEUTROPHILS 51  --  53   LYMPHOCYTES AUTOMATED 38  --  34   EOSINOPHILS AUTOMATED 2  --  2   IMMATURE GRANULOCYTE 0  --  Unmeasured   NEUTROPHILS ABSOLUTE 4.74  --  4.67   ABSOLUTE IMMATURE GRANULOCYTE 0.03  --  Unmeasured     Chem Review:  Recent Labs  Lab 02/21/16  0535 02/20/16  1340   SODIUM 136 138   POTASSIUM 4.4 5.3*   CHLORIDE 104 102   CO2 23 21*   BUN 10.8 12.0   CREATININE 0.9 1.0   GLUCOSE 178* 127*   CALCIUM 8.8 9.7   MAGNESIUM 1.9  --    BILIRUBIN, TOTAL  --  0.6   AST (SGOT)  --  30   ALT  --  23   ALKALINE PHOSPHATASE  --  87     Results     Procedure Component Value Units Date/Time    Lipid panel (Fasting) [[id number]]  (Abnormal)  Collected:  02/21/16 0535    Specimen Information:  Blood Updated:  02/21/16 1241     Cholesterol 121 mg/dL      Triglycerides 528 mg/dL      HDL 25 (L) mg/dL      LDL Calculated 74 mg/dL      VLDL Cholesterol Cal 22 mg/dL      CHOL/HDL Ratio 4.8     Glucose Whole Blood - POCT [[id number]]  (Abnormal) Collected:  02/21/16 1126     POCT - Glucose Whole blood 227 (H) mg/dL Updated:  27/25/36 6440    Glucose Whole Blood - POCT [[id number]]  (Abnormal) Collected:  02/21/16 3875  POCT - Glucose Whole blood 215 (H) mg/dL Updated:  16/10/96 0454    Hemolysis index [[id number]]  (Abnormal) Collected:  02/21/16 0535     Hemolysis Index 44 (H) Updated:  02/21/16 0744    Basic Metabolic Panel [[id number]]  (Abnormal) Collected:  02/21/16 0535    Specimen Information:  Blood Updated:  02/21/16 0603     Glucose 178 (H) mg/dL      BUN 86.5 mg/dL      Creatinine 0.9 mg/dL      Calcium 8.8 mg/dL      Sodium 784 mEq/L      Potassium 4.4 mEq/L      Chloride 104 mEq/L      CO2 23 mEq/L      Anion Gap 9.0     Magnesium [[id number]] Collected:  02/21/16 0535    Specimen Information:  Blood Updated:  02/21/16 0603     Magnesium 1.9 mg/dL     GFR [[id number]] Collected:  02/21/16 0535     EGFR >60.0 Updated:  02/21/16 0603    CBC with differential [[id number]]  (Abnormal) Collected:  02/21/16 0535    Specimen Information:  Blood from Blood Updated:  02/21/16 0547     WBC 9.34 x10 3/uL      Hgb 12.3 (L) g/dL      Hematocrit 34.7 (L) %      Platelets 264 x10 3/uL      RBC 4.11 (L) x10 6/uL      MCV 93.2 fL      MCH 29.9 pg      MCHC 32.1 g/dL      RDW 15 %      MPV 10.4 fL      Neutrophils 51 %      Lymphocytes Automated 38 %      Monocytes 9 %      Eosinophils Automated 2 %      Basophils Automated 0 %      Immature Granulocyte 0 %      Neutrophils Absolute 4.74 x10 3/uL      Abs Lymph Automated 3.53 x10 3/uL      Abs Mono Automated 0.86 x10 3/uL      Abs Eos Automated 0.19 x10 3/uL      Absolute Baso Automated 0.02 x10 3/uL      Absolute  Immature Granulocyte 0.03 x10 3/uL     Troponin I [[id number]] Collected:  02/21/16 0012    Specimen Information:  Blood Updated:  02/21/16 0110     Troponin I 0.03 ng/mL     Glucose Whole Blood - POCT [[id number]]  (Abnormal) Collected:  02/20/16 2124     POCT - Glucose Whole blood 150 (H) mg/dL Updated:  88/41/66 0630    Troponin I [[id number]] Collected:  02/20/16 2148    Specimen Information:  Blood Updated:  02/20/16 2223     Troponin I 0.03 ng/mL     CBC [[id number]]  (Abnormal) Collected:  02/20/16 2148    Specimen Information:  Blood from Blood Updated:  02/20/16 2155     WBC 9.81 x10 3/uL      Hgb 13.0 g/dL      Hematocrit 42.7 (L) %      Platelets 305 x10 3/uL      RBC 4.25 (L) x10 6/uL      MCV 92.7 fL      MCH 30.6 pg      MCHC 33.0 g/dL      RDW 15 %  MPV 10.6 fL     Narrative:      If not ordered in ED        Radiology reports have been reviewed:  Radiology Results (24 Hour)     ** No results found for the last 24 hours. **        Echocardiogram Adult Complete W Clr/ Dopp Waveform    02/21/2016  ECHOCARDIOGRAM Sonographer:  Steele Berg Indications:  Cardiomyopathy Height (in):  72 Weight (lb):  201 Blood Pressure:  117/70   2-D Measurements Left Ventricle                                          Diastolic Dimension:  66  (40-56 mm) Systolic Dimension:  59  (25-40 mm)     Septal Diastolic Thickness:  10  (6-11 mm)    Posterior Wall Thickness:  10  (6-11 mm) Fractional Shortening Percentage:  10%  (24-46 %)                       Visually Estimated Ejection Fraction:  20%  (55-75 %)                        Right Ventricle Diastolic Dimension:  31  (7-26 mm)                           Left Atrium End Systolic Dimension:  37  (19-40 mm)                    Aortic Root:  36  (20-37 mm)                       Doppler Measurements and Color Flow Imaging Valves                                        Aortic Valve:  1.3  (0.9-1.8 m/s).         Regurgitation:     Pulmonic Valve:  0.8  (0.6-0.9 m/s).     Regurgitation:      Mitral Valve:  0.7  (0.6-1.4 m/s).          Regurgitation:  Mild to moderate Tricuspid Valve:  0.4  (0.4-0.8 m/s.     Regurgitation:  Mild Left Ventricular Outflow Tract Velocity:  .0 m/s. E/A Ratio:  0.7 Est. PASP:  40 Est. RA Mean Pressure:  5 LA Volume     (18-58 ml) LA Volume Index     (16-28 ml/m2) TAPSE 18 (> or =16 mm) Echocardiogram M-mode, 2D, spectral Doppler and color flow imaging were performed and interpreted.     02/21/2016  1.  The quality of the study is technically adequate for interpretation. 2.  The left ventricle is enlarged in size. Left ventricular systolic function is severely reduced.. There is global hypokinesis.. Estimated EF is 20%. There is no left ventricular hypertrophy. There is  evidence of abnormal diastolic LV function, consistent with grade 2 diastolic dysfunction 3.  The left atrium is normal in size. 4.  The aortic valve is trileaflet. Normal valve excursion is present. There is no significant aortic insufficiency. No aortic stenosis is present. The aortic  root is normal in size. 5.  The mitral valve is structurally normal. Mild-to-moderate mitral insufficiency is present. 6.  The right ventricle is normal in size and function. 7.  The right atrium is normal in size. 8. The tricuspid valve is structurally normal. There is no significant tricuspid insufficiency. 9. The pulmonic valve is structurally normal. There is no significant pulmonic insufficiency. 10. There is  evidence of pulmonary hypertension. 11. No pericardial effusion, intracardiac thrombi, or masses are seen. 12. The atrial septum is structurally normal. No shunt by color flow doppler. CONCLUSION: Dilated left ventricle with reduced left ventricular systolic function. Estimated ejection fraction of 20%. Mild to moderate mitral regurgitation. 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 Audley Hose, MD 02/21/2016 2:00 PM     Xr Chest 2 Views    02/20/2016  Clinical History: Syncope. Findings: PA and  lateral views of the chest. Compared with prior studies including 12/29/2013 and Feb 20, 2016. Mild tortuosity of the thoracic aorta. Single lead pacemaker wire tip lies over or just beyond the level of the cavoatrial junction and is unchanged in position. Cardiac silhouette is not enlarged. Pulmonary vascularity is normal. Bilateral peribronchial thickening compatible with nonspecific bronchiolitis. New patchy bilateral lower lobe airspace disease, more pronounced on the left than right, suspicious for pneumonia. No pleural effusion. Mild rightward thoracolumbar spinal curvature and multilevel spondylosis.     02/20/2016   Bronchiolitis and new lower lobe airspace disease suspicious for pneumonia. Recommend short-term follow study to confirm resolution. Please see above for additional findings. Darra Lis, MD 02/20/2016 2:30 PM     Xr Chest 2 Views    Feb 20, 2016  History: cough Technique: PA and Lateral Comparison: 01/15/2016 Findings: There is mild patchy interstitial lung disease seen in the retrocardiac region on the lateral film which was not noted on the prior study. There is increasing inferior bibasilar peribronchial thickening There is no pneumothorax. The heart is normal in size.    The mediastinum is within normal limits. Pacemaker overlies left hemithorax with a single lead terminating over the SVC without change from prior     Feb 20, 2016   Increasing bibasilar peribronchial thickening with patchy airspace disease in the retrocardiac region of the left lower lobe this may represent a subacute bronchitis with left lower lobe atelectasis or ongoing bronchitis with left lower lobe bronchopneumonia Laurena Slimmer, MD 2016-02-20 9:51 AM     Ct Head Wo Contrast    02/20/2016  HISTORY: Syncope, trauma TECHNIQUE: Axial CT images of the head were obtained, without intravenous contrast. The following dose reduction techniques were utilized: automated exposure control and/or adjustment of the mA and/or kV according to  patient size, and the use of iterative reconstruction technique. COMPARISON: 10/27/2010 FINDINGS: There are mild periventricular hypodensities, suggesting chronic small vessel ischemic changes. Atherosclerotic vascular calcifications are identified. There is no mass effect or midline shift. There is no evidence of an acute intracranial hemorrhage or territorial infarct. The osseous structures are unremarkable. There are inflammatory changes in the visualized paranasal sinuses.     02/20/2016   1. No acute intracranial hemorrhage or mass effect. 2. Mild chronic appearing small vessel ischemic changes. Nicoletta Dress, MD 02/20/2016 2:07 PM     Pathology:   Specimens     None        Pending Lab Results:   Labs/Images to be followed at your PCP office: Unresulted Labs     None         Hospitalist:   Signed  by: Brynda Rim  02/21/2016 2:48 PM      *This note was generated by the Epic EMR system/ Dragon speech recognition and may contain inherent errors or omissions not intended by the user. Grammatical errors, random word insertions, deletions, pronoun errors and incomplete sentences are occasional consequences of this technology due to software limitations. Not all errors are caught or corrected. If there are questions or concerns about the content of this note or information contained within the body of this dictation they should be addressed directly with the author for clarification

## 2016-02-24 ENCOUNTER — Telehealth (INDEPENDENT_AMBULATORY_CARE_PROVIDER_SITE_OTHER): Payer: Self-pay | Admitting: Family Medicine

## 2016-02-24 NOTE — Telephone Encounter (Signed)
Post Acute Discharge Call    Hospital /  ED Discharge Date: 02/21/16  Primary Discharge Dx: Syncope and collapse   Secondary Dx:  Hospital / ED Discharge Appt with PCP: 02/25/16.    LPN II placed call to patient to follow up on recent hospital discharge, assess current status, address questions/concerns regarding discharge instructions / medications and encourage patient to schedule Hospital Discharge follow up appt with PCP.Unable to get through to number in chart. Left message at emergency contact number [facility] [address]) with contact information and request for patient to return call. Patient already scheduled to see Dr. Nedra Hai on 02/25/16. Not TCM eligible because call was not made within 2 business days of being discharged.

## 2016-02-25 ENCOUNTER — Encounter (INDEPENDENT_AMBULATORY_CARE_PROVIDER_SITE_OTHER): Payer: Self-pay | Admitting: Internal Medicine

## 2016-02-25 ENCOUNTER — Ambulatory Visit (INDEPENDENT_AMBULATORY_CARE_PROVIDER_SITE_OTHER): Payer: Commercial Managed Care - POS | Admitting: Internal Medicine

## 2016-02-25 VITALS — BP 122/83 | HR 81 | Temp 98.5°F | Resp 15 | Ht 72.0 in | Wt 198.0 lb

## 2016-02-25 DIAGNOSIS — J189 Pneumonia, unspecified organism: Secondary | ICD-10-CM

## 2016-02-25 DIAGNOSIS — I509 Heart failure, unspecified: Secondary | ICD-10-CM

## 2016-02-25 NOTE — Progress Notes (Signed)
Subjective:      Date: 02/25/2016 5:19 PM   Patient ID: [name] [name] is a 70 y.o. male.  Nursing Documentation:  Limb alert status: Patient asked and denied any limb restrictions for blood pressure/blood draws.  Has the patient seen any other providers since their last visit: no  The patient is due for nothing at this time, HM is up-to-date.  Chief Complaint:  Chief Complaint   Patient presents with   . Pneumonia     er f/u refill on spironolactin       Here for follow-up from recent hospitalization.   Inpatient facility: [facility]  Date of admission: May 4  Date of discharge: May 5  Discharge diagnosis: pneumonia  Hospital course: IV antibioitics  Prior hospitalization within past 30 days: No   Prior hospitalization within past 6 months: No  Comorbid conditions: diabetes and hypertension  Medical procedure(s) performed: IV hydration  Surgical procedure(s) performed: none  Diagnostic tests performed: chest x-ray  Pending tests: none  Home care: family assisting with home health needs  Specialist follow-up: cardiology  Post-hospital monitoring: none  Mobility status: normal mobility     Medication Reconciliation(required):  Hospital discharge medications reviewed and reconciled with current medication list.      Problem List:  Patient Active Problem List   Diagnosis   . Chest pain   . Diabetes mellitus   . Heart attack   . CAD (coronary artery disease)   . Non-ischemic cardiomyopathy   . Acute exacerbation of CHF (congestive heart failure)   . PUD (peptic ulcer disease)   . Elevated LFTs   . Acute dyspnea   . Elevated d-dimer   . CHF (congestive heart failure)   . Headaches due to old head injury   . Essential hypertension   . Pneumonia due to infectious organism, unspecified laterality, unspecified part of lung   . Syncope and collapse   . AICD (automatic cardioverter/defibrillator) present   . Syncope, unspecified syncope type       Current Medications:  Current Outpatient Prescriptions   Medication  Sig Dispense Refill   . aspirin 81 MG chewable tablet Chew 1 tablet (81 mg total) by mouth daily.  0   . butalbital-acetaminophen-caffeine (FIORICET, ESGIC) 50-325-40 MG per tablet Take 1 tablet by mouth every 4 (four) hours as needed. 10 tablet 0   . carbamide peroxide (DEBROX) 6.5 % otic solution Place 5 drops into the left ear 2 (two) times daily. Follow up with your PCP for cleaning on Monday. 15 mL 0   . carvedilol (COREG) 25 MG tablet TAKE 1 TABLET BY MOUTH TWICE A DAY WITH MEALS 60 tablet 0   . famotidine (PEPCID) 20 MG tablet TAKE 1 TABLET BY MOUTH TWICE A DAY 60 tablet 0   . fluticasone-salmeterol (ADVAIR DISKUS) 100-50 MCG/DOSE Aerosol Powder, Breath Activtivatede Inhale 1 puff into the lungs 2 (two) times daily. (Patient taking differently: Inhale 1 puff into the lungs daily.   ) 1 each 5   . LANTUS SOLOSTAR 100 UNIT/ML injection pen INJECT 40 TO 50 UNITS DAILY PER TESTS AS DIRECTED ONCE A DAY SUBCUTANEOUS 90 DAYS 15 pen 3   . niacin (NIASPAN) 500 MG CR tablet TAKE 1 TABLET BY MOUTH EVERY DAY AT BEDTIME  3   . ONE TOUCH ULTRA TEST test strip TEST BLOOD SUGAR 2-3 TIMES A DAY 300 each 1   . SITagliptin-metFORMIN (JANUMET) 50-1000 MG per tablet Take 1 tablet by mouth 2 (two) times daily  with meals. 180 tablet 3   . spironolactone (ALDACTONE) 25 MG tablet Take 25 mg by mouth daily.        . valsartan (DIOVAN) 160 MG tablet Take 1 tablet (160 mg total) by mouth 2 (two) times daily. 60 tablet 0     No current facility-administered medications for this visit.       Allergies:  No Known Allergies    Past Medical History:  Past Medical History   Diagnosis Date   . Heart attack 2011     and 2014   . Diabetes mellitus    . Hypertension    . Cardiac defibrillator in situ    . CAD (coronary artery disease)    . Ischemic cardiomyopathy      20%   . CHF (congestive heart failure) 08/12/2014   . AICD (automatic cardioverter/defibrillator) present 2014       Past Surgical History:  Past Surgical History   Procedure  Laterality Date   . Bring back open heart     . Hernia repair       LIH   . Duodenal ulcer  1973   . Orthopedic surgery       R foot corrective   . Tonsillectomy and adenoidectomy  1954   . Egd N/A 08/15/2014     Procedure: EGD;  Surgeon: Colon Branch, MD;  [facility];  Service: Gastroenterology;  Laterality: N/A;  egd w/ bx       Family History:  Family History   Problem Relation Age of Onset   . Breast cancer Mother    . Heart attack Mother 96   . Diabetes Brother        Social History:  Social History     Social History   . Marital Status: Married     Spouse Name: Lajoyce Corners   . Number of Children: 0   . Years of Education: N/A     Occupational History   . 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 Smoking status: Never Smoker    . Smokeless tobacco: Never Used   . Alcohol Use: No   . Drug Use: No   . Sexual Activity: Yes     Other Topics Concern   . Not on file     Social History Narrative       The following portions of the patient's history were reviewed and updated as appropriate: allergies, current medications, past family history, past medical history, past social history, past surgical history and problem list.    Vitals:  BP 122/83 mmHg  Pulse 81  Temp(Src) 98.5 F (36.9 C) (Oral)  Resp 15  Ht 1.829 m (6')  Wt 89.812 kg (198 lb)  BMI 26.85 kg/m2  SpO2 93%    Review of Systems   Constitutional: Negative for fever.   Respiratory: Positive for cough. Negative for shortness of breath.    Cardiovascular: Negative for chest pain and palpitations.   Gastrointestinal: Negative for nausea, vomiting and diarrhea.     General Examination:   GENERAL APPEARANCE: alert, in no acute distress, well developed, well nourished, oriented to time, place, and person.   HEAD: normal appearance, atraumatic.   EYES: extraocular movement intact (EOMI), sclera anicteric, conjunctiva clear.   ORAL CAVITY: normal oropharynx, normal lips, mucosa moist, no lesions.   THROAT: normal  appearance, clear, no erythema.   NECK/THYROID: neck supple,  no cervical lymphadenopathy, no  neck mass palpated, no thyromegaly.   LYMPH NODES: no palpable adenopathy.   SKIN: good turgor, no rashes, no suspicious lesions.   HEART: S1, S2 normal, no murmurs, rubs, gallops, regular rate and rhythm.   LUNGS: normal effort / no distress, normal breath sounds, clear to auscultation bilaterally, no wheezes, rales, rhonchi.   MUSCULOSKELETAL: full range of motion, no swelling or deformity.   EXTREMITIES: no edema  NEUROLOGIC: nonfocal, cranial nerves 2-12 grossly intact.   PSYCH: cognitive function intact, mood/affect full range, speech clear.    1. Pneumonia due to infectious organism, unspecified laterality, unspecified part of lung  Doing well s/p antibiotics    2. Chronic heart failure, unspecified heart failure type  Pt does not have appt with cardiology.  Advised pt to call cardiology and make f/u appt.  Addressed pt questions re: reduced EF. 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  Advised pt to avoid extensive physical activity, pt will discuss with cardiologist    Reviewed hospital records.    Beacher May, MD

## 2016-02-26 ENCOUNTER — Other Ambulatory Visit (INDEPENDENT_AMBULATORY_CARE_PROVIDER_SITE_OTHER): Payer: Self-pay | Admitting: Family Medicine

## 2016-02-27 NOTE — Telephone Encounter (Signed)
He doesn't need them. This must have been an automatic request from the pharmacy. Patient doing better after first dose of antibiotics.      Disregard request.

## 2016-02-27 NOTE — Telephone Encounter (Signed)
Why does he need more antibiotics ??

## 2016-03-18 ENCOUNTER — Other Ambulatory Visit (INDEPENDENT_AMBULATORY_CARE_PROVIDER_SITE_OTHER): Payer: Self-pay | Admitting: Family Medicine

## 2016-03-29 ENCOUNTER — Other Ambulatory Visit (INDEPENDENT_AMBULATORY_CARE_PROVIDER_SITE_OTHER): Payer: Self-pay | Admitting: Family Medicine

## 2016-04-03 LAB — VAHRT HISTORIC LVEF: Ejection Fraction: 20 %

## 2016-04-06 ENCOUNTER — Ambulatory Visit (INDEPENDENT_AMBULATORY_CARE_PROVIDER_SITE_OTHER): Payer: Self-pay | Admitting: Cardiovascular Disease

## 2016-04-06 ENCOUNTER — Encounter (INDEPENDENT_AMBULATORY_CARE_PROVIDER_SITE_OTHER): Payer: Self-pay | Admitting: Internal Medicine

## 2016-04-06 DIAGNOSIS — I428 Other cardiomyopathies: Secondary | ICD-10-CM | POA: Insufficient documentation

## 2016-04-07 ENCOUNTER — Encounter (INDEPENDENT_AMBULATORY_CARE_PROVIDER_SITE_OTHER): Payer: Self-pay | Admitting: Family Medicine

## 2016-04-08 ENCOUNTER — Other Ambulatory Visit: Payer: Self-pay | Admitting: Cardiovascular Disease

## 2016-04-08 DIAGNOSIS — I2510 Atherosclerotic heart disease of native coronary artery without angina pectoris: Secondary | ICD-10-CM

## 2016-04-11 ENCOUNTER — Other Ambulatory Visit (INDEPENDENT_AMBULATORY_CARE_PROVIDER_SITE_OTHER): Payer: Self-pay | Admitting: Family Medicine

## 2016-04-15 ENCOUNTER — Other Ambulatory Visit: Payer: Commercial Managed Care - POS

## 2016-04-28 ENCOUNTER — Encounter (INDEPENDENT_AMBULATORY_CARE_PROVIDER_SITE_OTHER): Payer: Self-pay

## 2016-05-04 ENCOUNTER — Other Ambulatory Visit (INDEPENDENT_AMBULATORY_CARE_PROVIDER_SITE_OTHER): Payer: Self-pay | Admitting: Family Medicine

## 2016-05-06 ENCOUNTER — Other Ambulatory Visit: Payer: Commercial Managed Care - POS

## 2016-05-14 ENCOUNTER — Other Ambulatory Visit: Payer: Commercial Managed Care - POS

## 2016-05-26 ENCOUNTER — Ambulatory Visit (INDEPENDENT_AMBULATORY_CARE_PROVIDER_SITE_OTHER): Payer: Self-pay

## 2016-05-29 ENCOUNTER — Other Ambulatory Visit (INDEPENDENT_AMBULATORY_CARE_PROVIDER_SITE_OTHER): Payer: Self-pay | Admitting: Family Medicine

## 2016-06-19 DIAGNOSIS — Z9289 Personal history of other medical treatment: Secondary | ICD-10-CM

## 2016-06-19 HISTORY — PX: OTHER SURGICAL HISTORY: SHX169

## 2016-06-19 HISTORY — DX: Personal history of other medical treatment: Z92.89

## 2016-06-26 ENCOUNTER — Other Ambulatory Visit: Payer: Commercial Managed Care - POS

## 2016-06-29 ENCOUNTER — Ambulatory Visit (INDEPENDENT_AMBULATORY_CARE_PROVIDER_SITE_OTHER): Payer: Self-pay | Admitting: Cardiovascular Disease

## 2016-06-29 ENCOUNTER — Inpatient Hospital Stay
Admission: RE | Admit: 2016-06-29 | Discharge: 2016-06-29 | Disposition: A | Discharge status: Home / Self Care | Payer: Commercial Managed Care - POS | Source: Ambulatory Visit | Attending: Cardiovascular Disease | Admitting: Cardiovascular Disease

## 2016-06-29 ENCOUNTER — Other Ambulatory Visit (INDEPENDENT_AMBULATORY_CARE_PROVIDER_SITE_OTHER): Payer: Self-pay

## 2016-06-29 DIAGNOSIS — I5189 Other ill-defined heart diseases: Secondary | ICD-10-CM | POA: Insufficient documentation

## 2016-06-29 DIAGNOSIS — I2510 Atherosclerotic heart disease of native coronary artery without angina pectoris: Secondary | ICD-10-CM | POA: Insufficient documentation

## 2016-06-29 DIAGNOSIS — I420 Dilated cardiomyopathy: Secondary | ICD-10-CM | POA: Insufficient documentation

## 2016-06-29 MED ORDER — TECHNETIUM TC 99M TETROFOSMIN INJECTION
1.0000 | Freq: Once | Status: AC | PRN
Start: 2016-06-29 — End: 2016-06-29
  Administered 2016-06-29: 1 via INTRAVENOUS
  Filled 2016-06-29: qty 1

## 2016-06-29 MED ORDER — TECHNETIUM TC 99M TETROFOSMIN INJECTION
1.0000 | Freq: Once | Status: AC | PRN
Start: 2016-06-29 — End: 2016-06-30
  Administered 2016-06-29 – 2016-06-30 (×2): 1 via INTRAVENOUS
  Filled 2016-06-29: qty 1

## 2016-06-30 MED ORDER — TECHNETIUM TC 99M TETROFOSMIN INJECTION
1.0000 | Freq: Once | Status: DC | PRN
Start: 2016-06-30 — End: 2016-07-01
  Filled 2016-06-30: qty 1

## 2016-07-05 ENCOUNTER — Other Ambulatory Visit (INDEPENDENT_AMBULATORY_CARE_PROVIDER_SITE_OTHER): Payer: Self-pay | Admitting: Family Medicine

## 2016-07-11 ENCOUNTER — Encounter (INDEPENDENT_AMBULATORY_CARE_PROVIDER_SITE_OTHER): Payer: Self-pay | Admitting: Internal Medicine

## 2016-07-23 ENCOUNTER — Other Ambulatory Visit (INDEPENDENT_AMBULATORY_CARE_PROVIDER_SITE_OTHER): Payer: Self-pay | Admitting: Family Medicine

## 2016-08-05 ENCOUNTER — Encounter (INDEPENDENT_AMBULATORY_CARE_PROVIDER_SITE_OTHER): Payer: Self-pay | Admitting: Family Medicine

## 2016-08-14 ENCOUNTER — Other Ambulatory Visit (INDEPENDENT_AMBULATORY_CARE_PROVIDER_SITE_OTHER): Payer: Self-pay

## 2016-08-14 ENCOUNTER — Telehealth (INDEPENDENT_AMBULATORY_CARE_PROVIDER_SITE_OTHER): Payer: Self-pay | Admitting: Family Medicine

## 2016-08-14 DIAGNOSIS — E0800 Diabetes mellitus due to underlying condition with hyperosmolarity without nonketotic hyperglycemic-hyperosmolar coma (NKHHC): Secondary | ICD-10-CM

## 2016-08-14 MED ORDER — GLUCOSE BLOOD VI STRP
1.0000 | ORAL_STRIP | Freq: Four times a day (QID) | 1 refills | Status: DC
Start: 2016-08-14 — End: 2016-08-15

## 2016-08-14 NOTE — Telephone Encounter (Signed)
Name Dose/Strength (i.e. mg) Directions (how patient is taking medication currently - i.e. One tab daily) 30 or 90 day supply requested?   Medication 1 ONE TOUCH ULTRA TEST test strip  Twice a Day 90 Day Supply   Medication 2       Medication 3       Medication 4       Medication 5       Medication 6         Prescription Management:  Local pharmacy: [facility]    Please mark "X" next to the preferred call back number:  Mobile:   Telephone Information:   Mobile [phone]       Home: [phone] X    Work:       Last visit: 08/05/2016     Next visit: Visit date not found     Patient calling for refill on his glucose test strips. Stated that [facility] faxed in a refill request a week ago. Patient currently out of strips

## 2016-08-15 ENCOUNTER — Other Ambulatory Visit (INDEPENDENT_AMBULATORY_CARE_PROVIDER_SITE_OTHER): Payer: Self-pay | Admitting: Family Medicine

## 2016-08-15 DIAGNOSIS — E0800 Diabetes mellitus due to underlying condition with hyperosmolarity without nonketotic hyperglycemic-hyperosmolar coma (NKHHC): Secondary | ICD-10-CM

## 2016-08-15 MED ORDER — GLUCOSE BLOOD VI STRP
1.0000 | ORAL_STRIP | Freq: Two times a day (BID) | 1 refills | Status: DC
Start: 2016-08-15 — End: 2018-12-20

## 2016-08-15 NOTE — Telephone Encounter (Signed)
Done

## 2016-08-21 ENCOUNTER — Other Ambulatory Visit (INDEPENDENT_AMBULATORY_CARE_PROVIDER_SITE_OTHER): Payer: Self-pay | Admitting: Family Medicine

## 2016-09-04 LAB — VAHRT HISTORIC LVEF: Ejection Fraction: 28 %

## 2016-09-08 ENCOUNTER — Encounter (INDEPENDENT_AMBULATORY_CARE_PROVIDER_SITE_OTHER): Payer: Self-pay | Admitting: Family Medicine

## 2016-09-18 DIAGNOSIS — J189 Pneumonia, unspecified organism: Secondary | ICD-10-CM

## 2016-09-18 HISTORY — DX: Pneumonia, unspecified organism: J18.9

## 2016-09-24 ENCOUNTER — Telehealth (INDEPENDENT_AMBULATORY_CARE_PROVIDER_SITE_OTHER): Payer: Self-pay | Admitting: Family Medicine

## 2016-09-24 NOTE — Telephone Encounter (Signed)
Sorry but if my schedule is full there is nothing I can do because it is not fair to double book . I do not do those visits over the phone. If there is an urgent problem please have him f/u with any Dr. Claiborne Billings why we have this great service where you can just walk in  . Otherwise I will get him in as soon as I have an opening.

## 2016-09-24 NOTE — Telephone Encounter (Signed)
Patient called requesting an appointment for tomorrow with you but your schedule is Full; he wants to see you and you only because you take care of his heart medications.  Can you please call patient back if you can accommodate him on your busy schedule. He has a cold.  Thank you!

## 2016-09-28 ENCOUNTER — Ambulatory Visit (INDEPENDENT_AMBULATORY_CARE_PROVIDER_SITE_OTHER): Payer: Commercial Managed Care - POS | Admitting: Family Medicine

## 2016-09-28 ENCOUNTER — Encounter (INDEPENDENT_AMBULATORY_CARE_PROVIDER_SITE_OTHER): Payer: Self-pay | Admitting: Family Medicine

## 2016-09-28 VITALS — BP 144/82 | HR 90 | Temp 97.5°F | Resp 16 | Ht 72.0 in | Wt 212.0 lb

## 2016-09-28 DIAGNOSIS — R059 Cough, unspecified: Secondary | ICD-10-CM

## 2016-09-28 DIAGNOSIS — Z23 Encounter for immunization: Secondary | ICD-10-CM

## 2016-09-28 DIAGNOSIS — J189 Pneumonia, unspecified organism: Secondary | ICD-10-CM | POA: Insufficient documentation

## 2016-09-28 DIAGNOSIS — R05 Cough: Secondary | ICD-10-CM

## 2016-09-28 MED ORDER — AMOXICILLIN-POT CLAVULANATE 875-125 MG PO TABS
1.0000 | ORAL_TABLET | Freq: Two times a day (BID) | ORAL | 0 refills | Status: DC
Start: 2016-09-28 — End: 2016-10-05

## 2016-09-28 MED ORDER — LEVALBUTEROL HCL 1.25 MG/3ML IN NEBU
1.2500 mg | INHALATION_SOLUTION | Freq: Three times a day (TID) | RESPIRATORY_TRACT | Status: DC
Start: 2016-09-28 — End: 2018-03-31

## 2016-09-28 MED ORDER — HYDROCOD POLST-CPM POLST ER 10-8 MG/5ML PO SUER
5.0000 mL | Freq: Two times a day (BID) | ORAL | 0 refills | Status: DC
Start: 2016-09-28 — End: 2016-10-22

## 2016-09-28 MED ORDER — ALBUTEROL SULFATE HFA 108 (90 BASE) MCG/ACT IN AERS
2.0000 | INHALATION_SPRAY | RESPIRATORY_TRACT | 5 refills | Status: DC | PRN
Start: 2016-09-28 — End: 2016-10-01

## 2016-09-28 NOTE — Progress Notes (Signed)
Subjective:      Date: 09/28/2016 5:13 PM   Patient ID: [name] [name] is a 70 y.o. male.    Chief Complaint:  Chief Complaint   Patient presents with   . Wheezing   . Cough       HPI:  HPI   Nursing Documentation:  Limb alert status: Patient asked and denied any limb restrictions for blood pressure/blood draws.  Has the patient seen any other providers since their last visit: no  The patient is due for eye exam and influenza vaccine     Pt states symptoms began three weeks ago - coughing, SOB and wheezing and difficulty walking up stairs. Pt states he blacks out when coughs.  Patient is wheesing and gets dizzy when walking up stairs.      Problem List:  Patient Active Problem List   Diagnosis   . Chest pain   . Diabetes mellitus   . Heart attack   . CAD (coronary artery disease)   . Non-ischemic cardiomyopathy   . Acute exacerbation of CHF (congestive heart failure)   . PUD (peptic ulcer disease)   . Elevated LFTs   . Acute dyspnea   . Elevated d-dimer   . CHF (congestive heart failure)   . Headaches due to old head injury   . Essential hypertension   . Pneumonia due to infectious organism, unspecified laterality, unspecified part of lung   . Syncope and collapse   . AICD (automatic cardioverter/defibrillator) present   . Syncope, unspecified syncope type       Current Medications:  Current Outpatient Prescriptions   Medication Sig Dispense Refill   . amlodipine-valsartan (EXFORGE) 10-320 MG per tablet TAKE 1 TABLET BY MOUTH DAILY. 90 tablet 2   . aspirin 81 MG chewable tablet Chew 1 tablet (81 mg total) by mouth daily.  0   . atorvastatin (LIPITOR) 40 MG tablet Take 40 mg by mouth daily.     . butalbital-acetaminophen-caffeine (FIORICET, ESGIC) 50-325-40 MG per tablet Take 1 tablet by mouth every 4 (four) hours as needed. 10 tablet 0   . carbamide peroxide (DEBROX) 6.5 % otic solution Place 5 drops into the left ear 2 (two) times daily. Follow up with your PCP for cleaning on Monday. 15 mL 0   . carvedilol  (COREG) 25 MG tablet TAKE 1 TABLET BY MOUTH TWICE A DAY WITH MEALS 60 tablet 0   . famotidine (PEPCID) 20 MG tablet TAKE 1 TABLET BY MOUTH TWICE A DAY 60 tablet 0   . fluticasone-salmeterol (ADVAIR DISKUS) 100-50 MCG/DOSE Aerosol Powder, Breath Activtivatede Inhale 1 puff into the lungs 2 (two) times daily. (Patient taking differently: Inhale 1 puff into the lungs daily.   ) 1 each 5   . glucose blood (ONE TOUCH ULTRA TEST) test strip 1 each by Other route 2 (two) times daily.Use as instructed 300 each 1   . ibuprofen (ADVIL,MOTRIN) 200 MG tablet Take 200 mg by mouth every 6 (six) hours as needed for Pain.     Marland Kitchen LANTUS SOLOSTAR 100 UNIT/ML injection pen INJECT 40 TO 50 UNITS DAILY PER TESTS AS DIRECTED ONCE A DAY SUBCUTANEOUS 90 DAYS (Patient taking differently: 50 units SQ at HS per pt) 15 pen 3   . niacin (NIASPAN) 500 MG CR tablet TAKE 1 TABLET BY MOUTH EVERY DAY AT BEDTIME  3   . SITagliptin-metFORMIN (JANUMET) 50-1000 MG per tablet Take 1 tablet by mouth 2 (two) times daily with meals. 180 tablet 3   .  spironolactone (ALDACTONE) 25 MG tablet Take 25 mg by mouth 2 (two) times daily.         Marland Kitchen zolpidem (AMBIEN) 10 MG tablet Take 10 mg by mouth nightly as needed for Sleep.     Marland Kitchen albuterol (PROAIR HFA) 108 (90 Base) MCG/ACT inhaler Inhale 2 puffs into the lungs every 4 (four) hours as needed for Wheezing or Shortness of Breath. 1 Inhaler 5   . amoxicillin-clavulanate (AUGMENTIN) 875-125 MG per tablet Take 1 tablet by mouth 2 (two) times daily.for 7 days 14 tablet 0   . hydrocodone-chlorpheniramine (TUSSIONEX) 10-8 MG/5ML suspension Take 5 mLs by mouth 2 (two) times daily. 115 mL 0     Current Facility-Administered Medications   Medication Dose Route Frequency Provider Last Rate Last Dose   . levalbuterol (XOPENEX) nebulizer solution 1.25 mg  1.25 mg Nebulization TID Zorita Pang, MD           Allergies:  No Known Allergies    Past Medical History:  Past Medical History:   Diagnosis Date   . AICD (automatic  cardioverter/defibrillator) present 2014   . CAD (coronary artery disease)    . Cardiac defibrillator in situ    . CHF (congestive heart failure) 08/12/2014   . Diabetes mellitus    . Heart attack 2011    and 2014   . Hypertension    . Ischemic cardiomyopathy     20%       Past Surgical History:  Past Surgical History:   Procedure Laterality Date   . BRING BACK OPEN HEART     . CARDIAC CATHETERIZATION     . duodenal ulcer  1973   . EGD N/A 08/15/2014    Procedure: EGD;  Surgeon: Colon Branch, MD;  Location: [facility];  Service: Gastroenterology;  Laterality: N/A;  egd w/ bx   . HERNIA REPAIR      LIH   . orthopedic surgery      R foot corrective   . TONSILLECTOMY AND ADENOIDECTOMY  1954       Family History:  Family History   Problem Relation Age of Onset   . Breast cancer Mother    . Heart attack Mother 63   . Diabetes Brother        Social History:  Social History     Social History   . Marital status: Married     Spouse name: Lajoyce Corners   . Number of children: 0   . Years of education: N/A     Occupational History   . 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 Smoking status: Never Smoker   . Smokeless tobacco: Never Used   . Alcohol use No   . Drug use: No   . Sexual activity: Yes     Other Topics Concern   . Not on file     Social History Narrative   . No narrative on file       The following sections were reviewed this encounter by the provider:        Vitals:  BP 144/82   Pulse 90   Temp 97.5 F (36.4 C) (Oral)   Resp 16   Ht 1.829 m (6')   Wt 96.2 kg (212 lb)   SpO2 92%   BMI 28.75 kg/m         ROS:  Constitutional: Negative for fever, chills and malaise/fatigue.   Respiratory: Pos for  cough, shortness of breath and wheezing.    Cardiovascular: Negative for chest pain and palpitations.   Gastrointestinal: Negative for abdominal pain, diarrhea and constipation.   Musculoskeletal: Negative for joint pain.   Skin: Negative for rash.   Neurological: Negative for dizziness  and headaches.   Psychiatric/Behavioral: The patient does not have insomnia.        Objective:     Physical Exam:  Constitutional: Patient is oriented to person, place, and time. Appears well-developed and well-nourished.   HENT:   Head: Normocephalic and atraumatic.   Eyes: Conjunctivae and EOM are normal. Pupils are equal, round, and reactive to light.   Cardiovascular: Normal rate, regular rhythm and normal heart sounds.  Exam reveals no gallop and no friction rub.    No murmur heard.  Pulmonary/Chest: Effort normal and breath sounds normal. Has no wheezes.   Musculoskeletal: Normal range of motion. Exhibits no edema or tenderness.   Neurological: Patient is alert and oriented to person, place, and time.   Psychiatric: Patient has a normal mood and affect.       Assessment/Plan:       1. Flu vaccine need    2. Cough  - albuterol (PROAIR HFA) 108 (90 Base) MCG/ACT inhaler; Inhale 2 puffs into the lungs every 4 (four) hours as needed for Wheezing or Shortness of Breath.  Dispense: 1 Inhaler; Refill: 5  - amoxicillin-clavulanate (AUGMENTIN) 875-125 MG per tablet; Take 1 tablet by mouth 2 (two) times daily.for 7 days  Dispense: 14 tablet; Refill: 0  - hydrocodone-chlorpheniramine (TUSSIONEX) 10-8 MG/5ML suspension; Take 5 mLs by mouth 2 (two) times daily.  Dispense: 115 mL; Refill: 0  - levalbuterol (XOPENEX) nebulizer solution 1.25 mg; Take 3 mLs (1.25 mg total) by nebulization 3 (three) times daily.              Zorita Pang, MD

## 2016-09-28 NOTE — Telephone Encounter (Signed)
Unable to find an app with PCP/pt wanted only DR Altamease Oiler on Friday.

## 2016-09-28 NOTE — Addendum Note (Signed)
Addended byZorita Pang on: 09/28/2016 06:04 PM     Modules accepted: Orders

## 2016-09-28 NOTE — Patient Instructions (Signed)
Adult Self-Care for Colds  Colds are caused by viruses. They can't be cured with antibiotics. However, you can relieve symptoms and support your body's efforts to heal itself. No matter which symptoms you have, be sure to drink plenty of fluids (water or clear soup); stop smoking and drinking alcohol; and get plenty of rest.   Understand a fever   Take your temperature several times a day. If your fever is100.4F(38.0C) for more than a day, call your doctor.   Relax, lie down. Go to bed if you want. Just get off your feet and rest. Also, drink plenty of fluids to avoid dehydration.   Take acetaminophen or a nonsteroidal anti-inflammatory agent (NSAID), such as ibuprofen.  Treat a troubled nose kindly   Breathe steam or heated humidified air to open blocked nasal passages. Stand in a hot shower or use a vaporizer. Be careful not to get burned by the steam.   Saline nasal sprays and decongestant tablets help open a stuffy nose. Antihistamines can also help, but they can cause side effects such as drowsiness and drying of the eyes, nose, and mouth.  Soothe a sore throat and cough   Gargle every2hours with1/4teaspoon of salt dissolved in1/2 cup of warm water. Suck on throat lozenges and cough drops to moisten your throat.   Cough medicines are available but it is unclear how effective they actually are.   Take acetaminophen or an NSAID, such as ibuprofen to ease throat pain  Ease digestive problems   Put fluid back into your body. Take frequent sips of clear liquids such as water or broth. Do not drink beverages with a lot of sugar in them, such as juices and sodas. These can make diarrhea worse. Older children and adults can drink sports drinks.   As your appetite returns, you can resume your normal diet. Ask your doctor whether there are any foods you should avoid.    When to seek medical care  When you first notice symptoms, ask your health care provider if antiviral medications are  appropriate.Antibiotics should not be taken for colds or flu. Also, call your doctor if you have any of the following symptoms or if you aren't feeling better after 7days:   Shortness of breath   Pain or pressure in the chest or abdomen   Worsening symptoms, especially after a period of improvement   Fever of100.4F (38.0C) or higher, or fever that doesn't go down with medication   Sudden dizziness or confusion   Severe or continued vomiting   Signs of dehydration, including extreme thirst, dark urine, infrequent urination, dry mouth   Spotted, red, or very sore throat   Date Last Reviewed: 04/06/2013   2000-2016 [employer] [address], [place] [address]. All rights reserved. This information is not intended as a substitute for professional medical care. Always follow your healthcare professional's instructions.

## 2016-09-30 ENCOUNTER — Telehealth (INDEPENDENT_AMBULATORY_CARE_PROVIDER_SITE_OTHER): Payer: Self-pay | Admitting: Family Medicine

## 2016-09-30 ENCOUNTER — Ambulatory Visit
Admission: RE | Admit: 2016-09-30 | Discharge: 2016-09-30 | Disposition: A | Discharge status: Home / Self Care | Payer: Commercial Managed Care - POS | Source: Ambulatory Visit | Attending: Family Medicine | Admitting: Family Medicine

## 2016-09-30 DIAGNOSIS — R05 Cough: Secondary | ICD-10-CM | POA: Insufficient documentation

## 2016-09-30 DIAGNOSIS — R059 Cough, unspecified: Secondary | ICD-10-CM

## 2016-09-30 DIAGNOSIS — J189 Pneumonia, unspecified organism: Secondary | ICD-10-CM | POA: Insufficient documentation

## 2016-09-30 NOTE — Telephone Encounter (Signed)
LM for Dr Gershon Crane to call back x 1 (09/30/2016)

## 2016-09-30 NOTE — Telephone Encounter (Signed)
Dr. Gershon Crane from [facility] called to speak directly with Dr. Altamease Oiler in reference to the pt's abnormal chest xray. He can be reached at [phone].

## 2016-09-30 NOTE — Telephone Encounter (Signed)
Dr Gershon Crane from [facility] called and talked to Dr Altamease Oiler this evening on 09/30/2016 around 1645

## 2016-10-01 ENCOUNTER — Emergency Department: Payer: Commercial Managed Care - POS

## 2016-10-01 ENCOUNTER — Inpatient Hospital Stay
Admission: EM | Admit: 2016-10-01 | Discharge: 2016-10-05 | DRG: 194 | Disposition: A | Discharge status: Home / Self Care | Payer: Commercial Managed Care - POS | Attending: Internal Medicine | Admitting: Internal Medicine

## 2016-10-01 ENCOUNTER — Inpatient Hospital Stay: Payer: Commercial Managed Care - POS | Admitting: Internal Medicine

## 2016-10-01 DIAGNOSIS — T380X5A Adverse effect of glucocorticoids and synthetic analogues, initial encounter: Secondary | ICD-10-CM | POA: Diagnosis not present

## 2016-10-01 DIAGNOSIS — I272 Pulmonary hypertension, unspecified: Secondary | ICD-10-CM | POA: Diagnosis present

## 2016-10-01 DIAGNOSIS — I110 Hypertensive heart disease with heart failure: Secondary | ICD-10-CM | POA: Diagnosis present

## 2016-10-01 DIAGNOSIS — J189 Pneumonia, unspecified organism: Principal | ICD-10-CM | POA: Diagnosis present

## 2016-10-01 DIAGNOSIS — E860 Dehydration: Secondary | ICD-10-CM | POA: Diagnosis present

## 2016-10-01 DIAGNOSIS — J040 Acute laryngitis: Secondary | ICD-10-CM | POA: Diagnosis present

## 2016-10-01 DIAGNOSIS — R0600 Dyspnea, unspecified: Secondary | ICD-10-CM | POA: Diagnosis present

## 2016-10-01 DIAGNOSIS — J40 Bronchitis, not specified as acute or chronic: Secondary | ICD-10-CM | POA: Diagnosis present

## 2016-10-01 DIAGNOSIS — N179 Acute kidney failure, unspecified: Secondary | ICD-10-CM | POA: Diagnosis present

## 2016-10-01 DIAGNOSIS — I252 Old myocardial infarction: Secondary | ICD-10-CM

## 2016-10-01 DIAGNOSIS — J45909 Unspecified asthma, uncomplicated: Secondary | ICD-10-CM | POA: Diagnosis present

## 2016-10-01 DIAGNOSIS — I255 Ischemic cardiomyopathy: Secondary | ICD-10-CM | POA: Diagnosis present

## 2016-10-01 DIAGNOSIS — I509 Heart failure, unspecified: Secondary | ICD-10-CM | POA: Diagnosis present

## 2016-10-01 DIAGNOSIS — E119 Type 2 diabetes mellitus without complications: Secondary | ICD-10-CM | POA: Diagnosis present

## 2016-10-01 DIAGNOSIS — E875 Hyperkalemia: Secondary | ICD-10-CM | POA: Diagnosis not present

## 2016-10-01 DIAGNOSIS — E872 Acidosis: Secondary | ICD-10-CM | POA: Diagnosis not present

## 2016-10-01 DIAGNOSIS — Z95810 Presence of automatic (implantable) cardiac defibrillator: Secondary | ICD-10-CM

## 2016-10-01 DIAGNOSIS — I2510 Atherosclerotic heart disease of native coronary artery without angina pectoris: Secondary | ICD-10-CM | POA: Diagnosis present

## 2016-10-01 DIAGNOSIS — R0902 Hypoxemia: Secondary | ICD-10-CM

## 2016-10-01 LAB — CBC AND DIFFERENTIAL
Absolute NRBC: 0 10*3/uL
Hematocrit: 41.5 % — ABNORMAL LOW (ref 42.0–52.0)
Hgb: 14.8 g/dL (ref 13.0–17.0)
MCH: 34.7 pg — ABNORMAL HIGH (ref 28.0–32.0)
MCHC: 35.7 g/dL (ref 32.0–36.0)
MCV: 97.2 fL (ref 80.0–100.0)
MPV: 11.5 fL (ref 9.4–12.3)
Nucleated RBC: 0 /100 WBC (ref 0.0–1.0)
Platelets: 253 10*3/uL (ref 140–400)
RBC: 4.27 10*6/uL — ABNORMAL LOW (ref 4.70–6.00)
RDW: 15 % (ref 12–15)
WBC: 10.38 10*3/uL (ref 3.50–10.80)

## 2016-10-01 LAB — URINALYSIS, REFLEX TO MICROSCOPIC EXAM IF INDICATED
Bilirubin, UA: NEGATIVE
Blood, UA: NEGATIVE
Glucose, UA: 50 — AB
Ketones UA: NEGATIVE
Leukocyte Esterase, UA: NEGATIVE
Nitrite, UA: NEGATIVE
Protein, UR: 30 — AB
Specific Gravity UA: 1.029 (ref 1.001–1.035)
Urine pH: 5 (ref 5.0–8.0)
Urobilinogen, UA: 2 mg/dL

## 2016-10-01 LAB — GLUCOSE WHOLE BLOOD - POCT
Whole Blood Glucose POCT: 230 mg/dL — ABNORMAL HIGH (ref 70–100)
Whole Blood Glucose POCT: 270 mg/dL — ABNORMAL HIGH (ref 70–100)
Whole Blood Glucose POCT: 234 mg/dL — ABNORMAL HIGH (ref 70–100)

## 2016-10-01 LAB — COMPREHENSIVE METABOLIC PANEL
ALT: 15 U/L (ref 0–55)
AST (SGOT): 17 U/L (ref 5–34)
Albumin/Globulin Ratio: 1 (ref 0.9–2.2)
Albumin: 3.8 g/dL (ref 3.5–5.0)
Alkaline Phosphatase: 93 U/L (ref 38–106)
Anion Gap: 10 (ref 5.0–15.0)
BUN: 20.1 mg/dL (ref 9.0–28.0)
Bilirubin, Total: 0.7 mg/dL (ref 0.2–1.2)
CO2: 25 mEq/L (ref 22–29)
Calcium: 9.5 mg/dL (ref 8.5–10.5)
Chloride: 104 mEq/L (ref 100–111)
Creatinine: 1.4 mg/dL — ABNORMAL HIGH (ref 0.7–1.3)
Globulin: 3.9 g/dL — ABNORMAL HIGH (ref 2.0–3.6)
Glucose: 191 mg/dL — ABNORMAL HIGH (ref 70–100)
Potassium: 5 mEq/L (ref 3.5–5.1)
Protein, Total: 7.7 g/dL (ref 6.0–8.3)
Sodium: 139 mEq/L (ref 136–145)

## 2016-10-01 LAB — MAN DIFF ONLY
Atypical Lymphocytes %: 3 %
Atypical Lymphocytes Absolute: 0.31 10*3/uL — ABNORMAL HIGH
Band Neutrophils Absolute: 0.52 10*3/uL (ref 0.00–1.00)
Band Neutrophils: 5 %
Basophils Absolute Manual: 0.1 10*3/uL (ref 0.00–0.20)
Basophils Manual: 1 %
Eosinophils Absolute Manual: 0.62 10*3/uL (ref 0.00–0.70)
Eosinophils Manual: 6 %
Lymphocytes Absolute Manual: 2.28 10*3/uL (ref 0.50–4.40)
Lymphocytes Manual: 22 %
Monocytes Absolute: 1.25 10*3/uL — ABNORMAL HIGH (ref 0.00–1.20)
Monocytes Manual: 12 %
Neutrophils Absolute Manual: 5.29 10*3/uL (ref 1.80–8.10)
Segmented Neutrophils: 51 %

## 2016-10-01 LAB — GROUP A STREP, RAPID ANTIGEN: Group A Strep, Rapid Antigen: NEGATIVE

## 2016-10-01 LAB — LACTIC ACID, PLASMA
Lactic Acid: 1.6 mmol/L (ref 0.2–2.0)
Lactic Acid: 2.2 mmol/L — ABNORMAL HIGH (ref 0.2–2.0)

## 2016-10-01 LAB — TROPONIN I
Troponin I: 0.03 ng/mL (ref 0.00–0.09)
Troponin I: 0.03 ng/mL (ref 0.00–0.09)
Troponin I: 0.02 ng/mL (ref 0.00–0.09)

## 2016-10-01 LAB — B-TYPE NATRIURETIC PEPTIDE: B-Natriuretic Peptide: 195.7 pg/mL — ABNORMAL HIGH (ref 0.0–100.0)

## 2016-10-01 LAB — CELL MORPHOLOGY
Cell Morphology: NORMAL
Platelet Estimate: NORMAL

## 2016-10-01 LAB — GFR: EGFR: >60

## 2016-10-01 MED ORDER — GLUCOSE 40 % PO GEL
15.0000 g | ORAL | Status: DC | PRN
Start: 2016-10-01 — End: 2016-10-02

## 2016-10-01 MED ORDER — CEFTRIAXONE SODIUM-DEXTROSE 1-3.74 GM-% IV SOLR
1.0000 g | INTRAVENOUS | Status: DC
Start: 2016-10-02 — End: 2016-10-05
  Administered 2016-10-02 – 2016-10-05 (×4): 1 g via INTRAVENOUS
  Filled 2016-10-01 (×5): qty 50

## 2016-10-01 MED ORDER — METHYLPREDNISOLONE SODIUM SUCC 125 MG IJ SOLR
80.0000 mg | Freq: Three times a day (TID) | INTRAMUSCULAR | Status: DC
Start: 2016-10-01 — End: 2016-10-01
  Administered 2016-10-01: 80 mg via INTRAVENOUS
  Filled 2016-10-01: qty 2

## 2016-10-01 MED ORDER — SODIUM CHLORIDE 0.9 % IV SOLN
500.0000 mg | INTRAVENOUS | Status: DC
Start: 2016-10-02 — End: 2016-10-05
  Administered 2016-10-02 – 2016-10-05 (×4): 500 mg via INTRAVENOUS
  Filled 2016-10-01 (×5): qty 500

## 2016-10-01 MED ORDER — ATORVASTATIN CALCIUM 20 MG PO TABS
40.0000 mg | ORAL_TABLET | Freq: Every day | ORAL | Status: DC
Start: 2016-10-01 — End: 2016-10-01
  Filled 2016-10-01: qty 2

## 2016-10-01 MED ORDER — SITAGLIPTIN PHOSPHATE 50 MG PO TABS
50.0000 mg | ORAL_TABLET | Freq: Every day | ORAL | Status: DC
Start: 2016-10-01 — End: 2016-10-05
  Administered 2016-10-01 – 2016-10-05 (×5): 50 mg via ORAL
  Filled 2016-10-01 (×6): qty 1

## 2016-10-01 MED ORDER — ALBUTEROL-IPRATROPIUM 2.5-0.5 (3) MG/3ML IN SOLN
3.0000 mL | Freq: Four times a day (QID) | RESPIRATORY_TRACT | Status: DC
Start: 2016-10-01 — End: 2016-10-01
  Administered 2016-10-01: 3 mL via RESPIRATORY_TRACT

## 2016-10-01 MED ORDER — NALOXONE HCL 0.4 MG/ML IJ SOLN (WRAP)
0.2000 mg | INTRAMUSCULAR | Status: DC | PRN
Start: 2016-10-01 — End: 2016-10-05

## 2016-10-01 MED ORDER — ENOXAPARIN SODIUM 40 MG/0.4ML SC SOLN
40.0000 mg | Freq: Every day | SUBCUTANEOUS | Status: DC
Start: 2016-10-01 — End: 2016-10-05
  Administered 2016-10-01 – 2016-10-05 (×5): 40 mg via SUBCUTANEOUS
  Filled 2016-10-01 (×5): qty 0.4

## 2016-10-01 MED ORDER — CEFTRIAXONE SODIUM 1 G IJ SOLR
1.0000 g | INTRAMUSCULAR | Status: DC
Start: 2016-10-01 — End: 2016-10-01

## 2016-10-01 MED ORDER — IPRATROPIUM BROMIDE 0.02 % IN SOLN
0.5000 mg | Freq: Once | RESPIRATORY_TRACT | Status: AC
Start: 2016-10-01 — End: 2016-10-01
  Administered 2016-10-01: 0.5 mg via RESPIRATORY_TRACT
  Filled 2016-10-01: qty 2.5

## 2016-10-01 MED ORDER — DEXTROSE 10 % IV BOLUS
125.0000 mL | INTRAVENOUS | Status: DC | PRN
Start: 2016-10-01 — End: 2016-10-02

## 2016-10-01 MED ORDER — SODIUM CHLORIDE 0.9 % IV MBP
1.0000 g | INTRAVENOUS | Status: DC
Start: 2016-10-01 — End: 2016-10-01
  Administered 2016-10-01: 1 g via INTRAVENOUS
  Filled 2016-10-01: qty 100
  Filled 2016-10-01: qty 1000

## 2016-10-01 MED ORDER — SODIUM CHLORIDE 0.9 % IV SOLN
500.0000 mg | INTRAVENOUS | Status: DC
Start: 2016-10-01 — End: 2016-10-01
  Administered 2016-10-01: 500 mg via INTRAVENOUS
  Filled 2016-10-01: qty 500

## 2016-10-01 MED ORDER — RISAQUAD PO CAPS
1.0000 | ORAL_CAPSULE | Freq: Every day | ORAL | Status: DC
Start: 2016-10-01 — End: 2016-10-05
  Administered 2016-10-01 – 2016-10-05 (×5): 1 via ORAL
  Filled 2016-10-01 (×5): qty 1

## 2016-10-01 MED ORDER — ALBUTEROL SULFATE (2.5 MG/3ML) 0.083% IN NEBU
2.5000 mg | INHALATION_SOLUTION | Freq: Once | RESPIRATORY_TRACT | Status: AC
Start: 2016-10-01 — End: 2016-10-01
  Administered 2016-10-01: 2.5 mg via RESPIRATORY_TRACT
  Filled 2016-10-01: qty 3

## 2016-10-01 MED ORDER — AMLODIPINE BESYLATE 5 MG PO TABS
ORAL_TABLET | Freq: Every day | ORAL | Status: DC
Start: 2016-10-01 — End: 2016-10-05
  Filled 2016-10-01 (×10): qty 1

## 2016-10-01 MED ORDER — ASPIRIN 81 MG PO CHEW
81.0000 mg | CHEWABLE_TABLET | Freq: Every day | ORAL | Status: DC
Start: 2016-10-01 — End: 2016-10-05
  Administered 2016-10-02 – 2016-10-05 (×4): 81 mg via ORAL
  Filled 2016-10-01 (×5): qty 1

## 2016-10-01 MED ORDER — INSULIN REGULAR HUMAN 100 UNIT/ML IJ SOLN
1.0000 [IU] | Freq: Every evening | INTRAMUSCULAR | Status: DC | PRN
Start: 2016-10-01 — End: 2016-10-02
  Administered 2016-10-01: 1 [IU] via SUBCUTANEOUS
  Filled 2016-10-01: qty 3

## 2016-10-01 MED ORDER — FAMOTIDINE 20 MG PO TABS
20.0000 mg | ORAL_TABLET | Freq: Two times a day (BID) | ORAL | Status: DC
Start: 2016-10-01 — End: 2016-10-05
  Administered 2016-10-01 – 2016-10-05 (×9): 20 mg via ORAL
  Filled 2016-10-01 (×9): qty 1

## 2016-10-01 MED ORDER — INSULIN REGULAR HUMAN 100 UNIT/ML IJ SOLN
1.0000 [IU] | Freq: Three times a day (TID) | INTRAMUSCULAR | Status: DC | PRN
Start: 2016-10-01 — End: 2016-10-02
  Administered 2016-10-01: 3 [IU] via SUBCUTANEOUS
  Administered 2016-10-02: 2 [IU] via SUBCUTANEOUS
  Administered 2016-10-02: 5 [IU] via SUBCUTANEOUS
  Filled 2016-10-01: qty 9
  Filled 2016-10-01: qty 6
  Filled 2016-10-01: qty 15

## 2016-10-01 MED ORDER — ZOLPIDEM TARTRATE 5 MG PO TABS
10.0000 mg | ORAL_TABLET | Freq: Every evening | ORAL | Status: DC | PRN
Start: 2016-10-01 — End: 2016-10-05
  Administered 2016-10-04: 10 mg via ORAL
  Filled 2016-10-01: qty 2

## 2016-10-01 MED ORDER — NIACIN ER (ANTIHYPERLIPIDEMIC) 500 MG PO TBCR
500.0000 mg | EXTENDED_RELEASE_TABLET | Freq: Every evening | ORAL | Status: DC
Start: 2016-10-01 — End: 2016-10-05
  Administered 2016-10-01 – 2016-10-04 (×4): 500 mg via ORAL
  Filled 2016-10-01 (×6): qty 1

## 2016-10-01 MED ORDER — GLUCAGON 1 MG IJ SOLR (WRAP)
1.0000 mg | INTRAMUSCULAR | Status: DC | PRN
Start: 2016-10-01 — End: 2016-10-02

## 2016-10-01 MED ORDER — ALBUTEROL-IPRATROPIUM 2.5-0.5 (3) MG/3ML IN SOLN
3.0000 mL | RESPIRATORY_TRACT | Status: DC
Start: 2016-10-01 — End: 2016-10-05
  Administered 2016-10-01 – 2016-10-05 (×16): 3 mL via RESPIRATORY_TRACT
  Filled 2016-10-01 (×16): qty 3

## 2016-10-01 MED ORDER — METHYLPREDNISOLONE SODIUM SUCC 40 MG IJ SOLR
40.0000 mg | Freq: Four times a day (QID) | INTRAMUSCULAR | Status: DC
Start: 2016-10-02 — End: 2016-10-02
  Administered 2016-10-02 (×4): 40 mg via INTRAVENOUS
  Filled 2016-10-01 (×4): qty 1

## 2016-10-01 MED ORDER — CARVEDILOL 12.5 MG PO TABS
25.0000 mg | ORAL_TABLET | Freq: Two times a day (BID) | ORAL | Status: DC
Start: 2016-10-01 — End: 2016-10-05
  Administered 2016-10-01 – 2016-10-05 (×8): 25 mg via ORAL
  Filled 2016-10-01 (×8): qty 2

## 2016-10-01 MED ORDER — ALBUTEROL-IPRATROPIUM 2.5-0.5 (3) MG/3ML IN SOLN
3.0000 mL | Freq: Four times a day (QID) | RESPIRATORY_TRACT | Status: DC | PRN
Start: 2016-10-01 — End: 2016-10-01

## 2016-10-01 MED ORDER — ALBUTEROL-IPRATROPIUM 2.5-0.5 (3) MG/3ML IN SOLN
3.0000 mL | RESPIRATORY_TRACT | Status: DC
Start: 2016-10-01 — End: 2016-10-01
  Filled 2016-10-01: qty 3

## 2016-10-01 MED ORDER — SPIRONOLACTONE 25 MG PO TABS
25.0000 mg | ORAL_TABLET | Freq: Two times a day (BID) | ORAL | Status: DC
Start: 2016-10-01 — End: 2016-10-03
  Administered 2016-10-01 – 2016-10-02 (×3): 25 mg via ORAL
  Filled 2016-10-01 (×3): qty 1

## 2016-10-01 MED ORDER — ALBUTEROL-IPRATROPIUM 2.5-0.5 (3) MG/3ML IN SOLN
3.0000 mL | RESPIRATORY_TRACT | Status: DC | PRN
Start: 2016-10-01 — End: 2016-10-05
  Administered 2016-10-01 – 2016-10-02 (×4): 3 mL via RESPIRATORY_TRACT
  Filled 2016-10-01 (×4): qty 3

## 2016-10-01 NOTE — ED Triage Notes (Signed)
Patient presents with EMS with general SOB that has gotten worse in the last week and with exertion. He called EMS at his job when he developed some chest pain in his sternum, he took 162mg  of ASA prior to EMS. He denies any chest pain. Room air sat 90%, 95% with 2 liters.

## 2016-10-01 NOTE — ED Provider Notes (Signed)
Physician/Midlevel provider first contact with patient: 10/01/16 1610         History     Chief Complaint   Patient presents with   . Chest Pain   . Shortness of Breath     Historian: patient    Chief Complaint:  Dyspnea, chest tightness  Location: chest  Timing: dyspnea over the past week, CP this AM  Severity: none at present  Quality:  tightness  Modifying Factors:  Worsened by activity   Associated signs and symptoms:  No lower extremity edema, no fever, no chills  Context:      HPI:  70 year old male with extensive cardiac history presenting with complaint of dyspnea and exertion over the past week as well as chest tightness noted today after going upstairs.  Patient reports having had a stress test approximately 2 months ago.  No fever.  No pain at present.          Past Medical History:   Diagnosis Date   . AICD (automatic cardioverter/defibrillator) present 2014   . CAD (coronary artery disease)    . Cardiac defibrillator in situ    . CHF (congestive heart failure) 08/12/2014   . Diabetes mellitus    . Heart attack 2011    and 2014   . Hypertension    . Ischemic cardiomyopathy     20%       Past Surgical History:   Procedure Laterality Date   . BRING BACK OPEN HEART     . CARDIAC CATHETERIZATION     . duodenal ulcer  1973   . EGD N/A 08/15/2014    Procedure: EGD;  Surgeon: Colon Branch, MD;  Location: [facility];  Service: Gastroenterology;  Laterality: N/A;  egd w/ bx   . HERNIA REPAIR      [facility]   . orthopedic surgery      R foot corrective   . TONSILLECTOMY AND ADENOIDECTOMY  1954       Family History   Problem Relation Age of Onset   . Breast cancer Mother    . Heart attack Mother 77   . Diabetes Brother        Social  Social History   Substance Use Topics   . Smoking status: Never Smoker   . Smokeless tobacco: Never Used   . Alcohol use No       .     No Known Allergies    Home Medications     Med List Status:  Pharmacy Completed Set By: Barnet Glasgow at 10/01/2016  2:39 PM         Status Comment         10/01/2016  9:26 AM    Has not taken any of his daily meds yet                amlodipine-valsartan (EXFORGE) 10-320 MG per tablet     TAKE 1 TABLET BY MOUTH DAILY.     amoxicillin-clavulanate (AUGMENTIN) 875-125 MG per tablet     Take 1 tablet by mouth 2 (two) times daily.for 7 days     aspirin 81 MG chewable tablet     Chew 1 tablet (81 mg total) by mouth daily.     carbamide peroxide (DEBROX) 6.5 % otic solution     Place 5 drops into the left ear 2 (two) times daily. Follow up with your PCP for cleaning on Monday.     carvedilol (COREG) 25 MG tablet  TAKE 1 TABLET BY MOUTH TWICE A DAY WITH MEALS     famotidine (PEPCID) 20 MG tablet     TAKE 1 TABLET BY MOUTH TWICE A DAY     fluticasone-salmeterol (ADVAIR DISKUS) 100-50 MCG/DOSE Aerosol Powder, Breath Activtivatede     Inhale 1 puff into the lungs 2 (two) times daily.     glucose blood (ONE TOUCH ULTRA TEST) test strip     1 each by Other route 2 (two) times daily.Use as instructed     hydrocodone-chlorpheniramine (TUSSIONEX) 10-8 MG/5ML suspension     Take 5 mLs by mouth 2 (two) times daily.     ibuprofen (ADVIL,MOTRIN) 200 MG tablet     Take 200 mg by mouth every 6 (six) hours as needed for Pain.     LANTUS SOLOSTAR 100 UNIT/ML injection pen     INJECT 40 TO 50 UNITS DAILY PER TESTS AS DIRECTED ONCE A DAY SUBCUTANEOUS 90 DAYS     Patient taking differently:  50 units SQ at HS per pt     levalbuterol (XOPENEX) nebulizer solution 1.25 mg     1.25 mg, Nebulization, RT - 3 times daily, First dose on Mon 09/28/16 at 2000     niacin (NIASPAN) 500 MG CR tablet     TAKE 1 TABLET BY MOUTH EVERY DAY AT BEDTIME     SITagliptin-metFORMIN (JANUMET) 50-1000 MG per tablet     Take 1 tablet by mouth 2 (two) times daily with meals.     Patient taking differently:  Take 1 tablet by mouth daily.         spironolactone (ALDACTONE) 25 MG tablet     Take 25 mg by mouth 2 (two) times daily.         zolpidem (AMBIEN) 10 MG tablet     Take 10 mg by mouth  nightly as needed for Sleep.                     Review of Systems   Respiratory: Positive for chest tightness and shortness of breath. Negative for cough.    Cardiovascular: Positive for chest pain. Negative for palpitations and leg swelling.   Gastrointestinal: Negative for abdominal pain, nausea and vomiting.   Musculoskeletal: Negative for back pain and myalgias.   All other systems reviewed and are negative.      Physical Exam    BP: 155/90, Heart Rate: (!) 102, Temp: 98 F (36.7 C), Resp Rate: 20, SpO2: 90 %, Weight: 99 kg     Physical Exam   Constitutional: He is oriented to person, place, and time. He appears well-developed and well-nourished.   HENT:   Head: Normocephalic and atraumatic.   Eyes: Conjunctivae and EOM are normal. Right eye exhibits no discharge. Left eye exhibits no discharge. No scleral icterus.   Neck: No JVD present. No tracheal deviation present.   Cardiovascular: Regular rhythm and normal heart sounds.  Tachycardia present.  Exam reveals no gallop and no friction rub.    No murmur heard.  Pulmonary/Chest: Effort normal. No stridor. No respiratory distress. He has no wheezes. He has rales (bilateral).   Abdominal: Soft. Bowel sounds are normal. He exhibits no distension and no mass. There is no tenderness. There is no rebound and no guarding.   Musculoskeletal: Normal range of motion. He exhibits no edema or tenderness.   Neurological: He is alert and oriented to person, place, and time. He has normal strength. No sensory deficit. GCS eye subscore  is 4. GCS verbal subscore is 5. GCS motor subscore is 6.   Skin: Skin is warm, dry and intact.   Psychiatric: He has a normal mood and affect. His speech is normal and behavior is normal. Judgment and thought content normal.         MDM and ED Course     ED Medication Orders     Start Ordered     Status Ordering Provider    10/01/16 1143 10/01/16 1142  cefTRIAXone (ROCEPHIN) 1 g in sodium chloride 0.9 % 100 mL IVPB mini-bag plus  Every 24 hours  scheduled     Route: Intravenous  Ordered Dose: 1 g     Last MAR action:  Stopped Malu Pellegrini    10/01/16 1143 10/01/16 1142  azithromycin (ZITHROMAX) 500 mg in sodium chloride 0.9 % 250 mL IVPB  Every 24 hours scheduled     Route: Intravenous  Ordered Dose: 500 mg     Last [facility] action:  New Bag Chardai Gangemi    10/01/16 0932 10/01/16 0931  albuterol (PROVENTIL) nebulizer solution 2.5 mg  RT - Once     Route: Nebulization  Ordered Dose: 2.5 mg     Last MAR action:  Given Kristyne Woodring    10/01/16 0932 10/01/16 0931  ipratropium (ATROVENT) 0.02 % nebulizer solution 0.5 mg  RT - Once     Route: Nebulization  Ordered Dose: 0.5 mg     Last MAR action:  Given Ramiyah Mcclenahan             MDM  Number of Diagnoses or Management Options  Diagnosis management comments: I, Earline Mayotte MD, have been the primary provider for [name] during this Emergency Dept visit.      DDx includes, but is not limited to: CHF, bronchitis, pneumonia, angina    Initial Plan: Labs, EKG, chest x-ray, albuterol, Atrovent    EKG Interpretation    This EKG Interpretation was documented by Earline Mayotte, the ED physician    Rhythm: Sinus tachycardia  Rate: 101  Axis: normal  Ectopy: none  Conduction: normal  ST Segments: no acute change  T Waves: Nonspecific T-wave flattening and inversions noted in V5 and V6  Q Waves: none    Clinical Impression: Nonspecific EKG, no change from Feb 19, 2069      ED Course      Patient with clinical evidence of pneumonia.  Requires admission for supplemental oxygen, IV antibiotics.  Patient is at risk for respiratory decompensation and sepsis.    Results     Procedure Component Value Units Date/Time    Procalcitonin [[id number]] Collected:  10/01/16 0943     Updated:  10/01/16 1242    Blood Culture Aerobic/Anaerobic #1 [[id number]] Collected:  10/01/16 0958    Specimen:  Arm from Blood Updated:  10/01/16 1212    Narrative:       1 BLUE+1 PURPLE    Blood Culture Aerobic/Anaerobic #2  [[id number]] Collected:  10/01/16 0949    Specimen:  Arm from Blood Updated:  10/01/16 1212    Narrative:       1 BLUE+1 PURPLE    Comprehensive metabolic panel [[id number]]  (Abnormal) Collected:  10/01/16 0949    Specimen:  Blood Updated:  10/01/16 1104     Glucose 191 (H) mg/dL      BUN 62.9 mg/dL      Creatinine 1.4 (H) mg/dL      Sodium 528 mEq/L  Potassium 5.0 mEq/L      Chloride 104 mEq/L      CO2 25 mEq/L      Calcium 9.5 mg/dL      Protein, Total 7.7 g/dL      Albumin 3.8 g/dL      AST (SGOT) 17 U/L      ALT 15 U/L      Alkaline Phosphatase 93 U/L      Bilirubin, Total 0.7 mg/dL      Globulin 3.9 (H) g/dL      Albumin/Globulin Ratio 1.0     Anion Gap 10.0    Manual Differential [[id number]]  (Abnormal) Collected:  10/01/16 0949     Updated:  10/01/16 1100     Segmented Neutrophils 51 %      Band Neutrophils 5 %      Lymphocytes Manual 22 %      Monocytes Manual 12 %      Eosinophils Manual 6 %      Basophils Manual 1 %      Atypical Lymph % 3 %      Abs Seg Manual 5.29 x10 3/uL      Bands Absolute 0.52 x10 3/uL      Absolute Lymph Manual 2.28 x10 3/uL      Monocytes Absolute 1.25 (H) x10 3/uL      Absolute Eos Manual 0.62 x10 3/uL      Absolute Baso Manual 0.10 x10 3/uL      Atypical Lymph Absolute 0.31 (H) x10 3/uL     Cell MorpHology [[id number]] Collected:  10/01/16 0949     Updated:  10/01/16 1100     Cell Morphology: Normal     Platelet Estimate Normal    CBC with differential [[id number]]  (Abnormal) Collected:  10/01/16 0949    Specimen:  Blood from Blood Updated:  10/01/16 1059     WBC 10.38 x10 3/uL      Hgb 14.8 g/dL      Hematocrit 08.6 (L) %      Platelets 253 x10 3/uL      RBC 4.27 (L) x10 6/uL      MCV 97.2 fL      MCH 34.7 (H) pg      MCHC 35.7 g/dL      RDW 15 %      MPV 11.5 fL      Nucleated RBC 0.0 /100 WBC      Absolute NRBC 0.00 x10 3/uL     Lactic acid x2, sepsis [[id number]] Collected:  10/01/16 1028    Specimen:  Blood Updated:  10/01/16 1046     Lactic acid 1.6 mmol/L     Narrative:        Cancel if the initial lactate level is < 2.0 mmol/L.    Troponin I [[id number]] Collected:  10/01/16 0949    Specimen:  Blood Updated:  10/01/16 1037     Troponin I 0.03 ng/mL     B-type Natriuretic Peptide [[id number]]  (Abnormal) Collected:  10/01/16 0949    Specimen:  Blood Updated:  10/01/16 1036     B-Natriuretic Peptide 195.7 (H) pg/mL     GFR [[id number]] Collected:  10/01/16 0949     Updated:  10/01/16 1035     EGFR >60.0    Rapid influenza A/B antigens [[id number]] Collected:  10/01/16 0949    Specimen:  Nasopharyngeal from Nasal Aspirate Updated:  10/01/16 1014    Narrative:       ORDER#:  [id number]                                    ORDERED BY: Willodean Leven, JAGADI  SOURCE: Nasal Aspirate                               COLLECTED:  10/01/16 09:49  ANTIBIOTICS AT COLL.:                                RECEIVED :  10/01/16 09:57  Influenza Rapid Antigen A&B                FINAL       10/01/16 10:14  10/01/16   Negative for Influenza A and B             Reference Range: Negative          Radiology Results (24 Hour)     Procedure Component Value Units Date/Time    XR Chest  AP Portable [[id number]] Collected:  10/01/16 0959    Order Status:  Completed Updated:  10/01/16 1004    Narrative:       CLINICAL HISTORY: Chest pain    COMPARISON: Chest x-ray dated 09/30/2016    AP view of the chest was performed.    FINDINGS:   There is a left-sided pacemaker with the lead wires in appropriate  position.    The lungs are clear.  The heart size and bones are within normal limits.      Impression:         No acute cardiopulmonary disease.    Annamary Carolin, MD   10/01/2016 10:00 AM                Procedures    Clinical Impression & Disposition     Clinical Impression  Final diagnoses:   Dyspnea, unspecified type   Hypoxia   Pneumonia due to infectious organism, unspecified laterality, unspecified part of lung        ED Disposition     ED Disposition Condition Date/Time Comment    Admit  Thu Oct 01, 2016  1:23 PM Admitting  Physician: Marguerite Olea THAW DAR [[id number]]   Diagnosis: Dyspnea [621308]   Estimated Length of Stay: > or = to 2 midnights   Tentative Discharge Plan?: Home or Self Care [1]   Patient Class: Inpatient [101]             New Prescriptions    No medications on file                 Earline Mayotte, MD  10/01/16 1446

## 2016-10-01 NOTE — Consults (Signed)
PULMONARY CONSULTATION                              "[facility]"    Date Time: 10/01/16 5:42 PM  Patient Name: [name] [name]  Attending Physician: Sunny Schlein, MD  Reason for Consultation: Wheezing and suspected pneumonia    Assessment:     -Suspect pneumonia, likely CAP.  -Bronchospasm and significant wheezing and dyspnea related to above.  Baseline reactive airway disease/asthma cannot be excluded.  -Tested negative for influenza and reports has not received flu shot.  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  Clinically seems to be euvolemic.  -Currently afebrile  -Mildly elevated lactic acid.  -History of nonocclusive coronary artery disease.  -Hypertension  -Previous pneumonia and bronchospasm over a year ago.  -Lifetime nonsmoker.  -Status post automatic implantable cardiac defibrillator placement.  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  -Type 2 diabetes    Plan:     -Continue daily antibiotic regimen including Rocephin and azithromycin, and follow-up cultures.  -Check pro-calcitonin level.  -Sputum culture  -DuoNeb every 4 hours while awake and every 2 hours as needed.  -IV steroids, I lowered the dose due to the patient's history of diabetes  -Wean O2 as tolerated.  -Bedside spirometry  -Eventually he will benefit from outpatient evaluation for possible asthma and having a pulmonary function test.    -Suspect possible discharge in the next 48-72 hours   -We will follow.    History of Present Illness:   [name] [name] is a 70 y.o. male who presents to the hospital with 2 weeks history of productive cough with brown color sputum, reports gradual worsening of dyspnea, low-grade temperature of 104 days ago, progressive increase in wheezing and dyspnea on exertion.  Patient was evaluated by primary care physician as outpatient yesterday.  Chest x-ray showed  right lower lobe infiltrate, patient was started on Augmentin and was given prescription for codeine cough syrup.  Due to the worsening of symptoms, patient presented to the ED.  Chest x-ray showed no acute disease, patient has no leukocytosis and has been afebrile.  Per report, patient has been wheezing significantly with significant dyspnea which improved after receiving DuoNeb.  Patient was given empirically azithromycin and ceftriaxone.  He was placed on 2 L of nasal cannula oxygen, IV steroids, and admitted to the medical floor.  At the time of my evaluation, patient is feeling better but continues to have wheezing and dyspnea.  Denies any current chest pain.  Denies any contact with sick individuals.  Patient reports vomiting and diarrhea last week which resolved spontaneously.  Denies any recent traveling or taking antibiotics.  Patient has no previous documented history of asthma, chronic obstructive pulmonary disease, but has had previous pneumonia over a year ago with similar presentation that required treatment with albuterol inhaler.  Patient has past medical history of nonischemic cardiomyopathy with ejection fraction of 20 percent, reports compliance with salt intake and follow-up with cardiology.  He has automatic implantable cardiac defibrillator in place.  He is a lifetime nonsmoker and denies history of exposure to environmental hazards such as asbestosis, secondhand smoke, or significant occupational exposure.  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  He is originally from [place], [place] and has  been living in [place] since 2002, living with his family, he does not drink alcohol, is a lifetime nonsmoker, and does not have a pet.  His family history notable for history of asthma in mother and diabetes in brother.  He denies any history of sleep apnea or non-refreshing sleep.  Denies any loud snoring or witnessed apnea.    Past  Medical History:     Past Medical History:   Diagnosis Date   . AICD (automatic cardioverter/defibrillator) present 2014   . CAD (coronary artery disease)    . Cardiac defibrillator in situ    . CHF (congestive heart failure) 08/12/2014   . Diabetes mellitus    . Heart attack 2011    and 2014   . Hypertension    . Ischemic cardiomyopathy     20%       Past Surgical History:     Past Surgical History:   Procedure Laterality Date   . BRING BACK OPEN HEART     . CARDIAC CATHETERIZATION     . duodenal ulcer  1973   . EGD N/A 08/15/2014    Procedure: EGD;  Surgeon: Colon Branch, MD;  Location: [facility];  Service: Gastroenterology;  Laterality: N/A;  egd w/ bx   . HERNIA REPAIR      [facility]   . orthopedic surgery      R foot corrective   . TONSILLECTOMY AND ADENOIDECTOMY  1954       Family History:     Family History   Problem Relation Age of Onset   . Breast cancer Mother    . Heart attack Mother 63   . Diabetes Brother        Social History:     Social History     Social History   . Marital status: Married     Spouse name: Lajoyce Corners   . Number of children: 0   . Years of education: N/A     Occupational History   . 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 Smoking status: Never Smoker   . Smokeless tobacco: Never Used   . Alcohol use No   . Drug use: No   . Sexual activity: Yes     Other Topics Concern   . Not on file     Social History Narrative   . No narrative on file       Allergies:   No Known Allergies    Medications:     Facility-Administered Medications Prior to Admission   Medication   . levalbuterol (XOPENEX) nebulizer solution 1.25 mg     Prescriptions Prior to Admission   Medication Sig   . amlodipine-valsartan (EXFORGE) 10-320 MG per tablet TAKE 1 TABLET BY MOUTH DAILY.   Marland Kitchen amoxicillin-clavulanate (AUGMENTIN) 875-125 MG per tablet Take 1 tablet by mouth 2 (two) times daily.for 7 days   . aspirin 81 MG chewable tablet Chew 1 tablet (81 mg total) by mouth daily.   . carbamide  peroxide (DEBROX) 6.5 % otic solution Place 5 drops into the left ear 2 (two) times daily. Follow up with your PCP for cleaning on Monday.   . carvedilol (COREG) 25 MG tablet TAKE 1 TABLET BY MOUTH TWICE A DAY WITH MEALS   . famotidine (PEPCID) 20 MG tablet TAKE 1 TABLET BY MOUTH TWICE A DAY   . fluticasone-salmeterol (ADVAIR DISKUS) 100-50 MCG/DOSE Aerosol Powder, Breath Activtivatede Inhale 1 puff  into the lungs 2 (two) times daily.   . hydrocodone-chlorpheniramine (TUSSIONEX) 10-8 MG/5ML suspension Take 5 mLs by mouth 2 (two) times daily.   Marland Kitchen ibuprofen (ADVIL,MOTRIN) 200 MG tablet Take 200 mg by mouth every 6 (six) hours as needed for Pain.   Marland Kitchen LANTUS SOLOSTAR 100 UNIT/ML injection pen INJECT 40 TO 50 UNITS DAILY PER TESTS AS DIRECTED ONCE A DAY SUBCUTANEOUS 90 DAYS (Patient taking differently: 50 units SQ at HS per pt)   . niacin (NIASPAN) 500 MG CR tablet TAKE 1 TABLET BY MOUTH EVERY DAY AT BEDTIME   . SITagliptin-metFORMIN (JANUMET) 50-1000 MG per tablet Take 1 tablet by mouth 2 (two) times daily with meals. (Patient taking differently: Take 1 tablet by mouth daily.    )   . spironolactone (ALDACTONE) 25 MG tablet Take 25 mg by mouth 2 (two) times daily.       Marland Kitchen zolpidem (AMBIEN) 10 MG tablet Take 10 mg by mouth nightly as needed for Sleep.   Marland Kitchen glucose blood (ONE TOUCH ULTRA TEST) test strip 1 each by Other route 2 (two) times daily.Use as instructed       Review of Systems:     CONSTITUTIONAL:Reports minimal decrease in appetite, had fever and chills a few days ago.  Denies any recent weight change.  EYES: No Visual changes; No Eye pain   ENT: Denies any hearing loss, reports postnasal drainage and runny nose which is mostly clear.  CARDIOVASCULAR: No chest pain; No palpitation; No Peripheral edema   RESPIRATORY:Positive for productive cough, brown color sputum, dyspnea on exertion and significant wheezing as per HPI.  Negative for hemoptysis.  ABDOMINAL: No Abdominal pain; reports diarrhea and vomiting last  week which is now resolved.  GENITOURINARY: No Dysuria; No Urgency; No Incontinence.  MUSCULOSKELETAL: No Joint pain/swelling; No Musculoskeletal deformities   SKIN: No Rash   NEUROLOGICAL: No Mental status changes; No Motor weakness; No Sensory changes   PSYCHOLOGICAL: No Depression; No Anxiety   ENDOCRINE: No Polyuria/polydipsia; No Heat/Cold intolerance   HEMATOLOGY/LYMPHATIC: No Easy bleeding/bruising; No Swollen nodes   ALLERGY/IMMUNOLOGY: No Allergic reaction      Physical Exam:     Vitals:    10/01/16 1543   BP:    Pulse: 97   Resp: 19   Temp:    SpO2: 93%     Body mass index is 29.6 kg/m.    GENERAL: No acute distress.Patient has audible wheezing and has difficulty speaking in full sentences.    HEENT: Neck supple, oropharynx clear, No lymphadenopathy.   LUNGS: Patient has inspiratory crackles predominantly on the right side, moderate to significant expiratory wheezing and overall slightly diminished breath sounds throughout the lungs.  CARDIOVASCULAR: Regular rate without audible murmurs. He has no JVD   ABDOMEN: Bowel sounds present. Soft, non-tender, no organomegaly noted.   EXTREMITIES: no edema, no cyanosis or clubbing noted.   SKIN: No rashes.   NEURO: Unremarkable   MENTAL STATUS: Alert   LYMPHATIC: No enlarged nodes    Intake and Output Summary (Last 24 hours) at Date Time  No intake or output data in the 24 hours ending 10/01/16 1742    Labs:     Results     Procedure Component Value Units Date/Time    Troponin I [[id number]] Collected:  10/01/16 1541    Specimen:  Blood Updated:  10/01/16 1633     Troponin I 0.03 ng/mL     Glucose Whole Blood - POCT [[id number]]  (Abnormal) Collected:  10/01/16  1607     Updated:  10/01/16 1619     POCT - Glucose Whole blood 270 (H) mg/dL     Lactic acid, plasma [[id number]]  (Abnormal) Collected:  10/01/16 1541     Updated:  10/01/16 1617     Lactic acid 2.2 (H) mmol/L     Narrative:       Cancel if the initial lactate level is < 2.0 mmol/L.  Specimen slightly  hemolyzed    Glucose Whole Blood - POCT [[id number]]  (Abnormal) Collected:  10/01/16 1500     Updated:  10/01/16 1506     POCT - Glucose Whole blood 230 (H) mg/dL     Procalcitonin [[id number]] Collected:  10/01/16 0943     Updated:  10/01/16 1242    Blood Culture Aerobic/Anaerobic #1 [[id number]] Collected:  10/01/16 0958    Specimen:  Arm from Blood Updated:  10/01/16 1212    Narrative:       1 BLUE+1 PURPLE    Blood Culture Aerobic/Anaerobic #2 [[id number]] Collected:  10/01/16 0949    Specimen:  Arm from Blood Updated:  10/01/16 1212    Narrative:       1 BLUE+1 PURPLE    Comprehensive metabolic panel [[id number]]  (Abnormal) Collected:  10/01/16 0949    Specimen:  Blood Updated:  10/01/16 1104     Glucose 191 (H) mg/dL      BUN 17.6 mg/dL      Creatinine 1.4 (H) mg/dL      Sodium 160 mEq/L      Potassium 5.0 mEq/L      Chloride 104 mEq/L      CO2 25 mEq/L      Calcium 9.5 mg/dL      Protein, Total 7.7 g/dL      Albumin 3.8 g/dL      AST (SGOT) 17 U/L      ALT 15 U/L      Alkaline Phosphatase 93 U/L      Bilirubin, Total 0.7 mg/dL      Globulin 3.9 (H) g/dL      Albumin/Globulin Ratio 1.0     Anion Gap 10.0    Manual Differential [[id number]]  (Abnormal) Collected:  10/01/16 0949     Updated:  10/01/16 1100     Segmented Neutrophils 51 %      Band Neutrophils 5 %      Lymphocytes Manual 22 %      Monocytes Manual 12 %      Eosinophils Manual 6 %      Basophils Manual 1 %      Atypical Lymph % 3 %      Abs Seg Manual 5.29 x10 3/uL      Bands Absolute 0.52 x10 3/uL      Absolute Lymph Manual 2.28 x10 3/uL      Monocytes Absolute 1.25 (H) x10 3/uL      Absolute Eos Manual 0.62 x10 3/uL      Absolute Baso Manual 0.10 x10 3/uL      Atypical Lymph Absolute 0.31 (H) x10 3/uL     Cell MorpHology [[id number]] Collected:  10/01/16 0949     Updated:  10/01/16 1100     Cell Morphology: Normal     Platelet Estimate Normal    CBC with differential [[id number]]  (Abnormal) Collected:  10/01/16 0949    Specimen:  Blood from Blood  Updated:  10/01/16 1059     WBC 10.38 x10 3/uL  Hgb 14.8 g/dL      Hematocrit 60.4 (L) %      Platelets 253 x10 3/uL      RBC 4.27 (L) x10 6/uL      MCV 97.2 fL      MCH 34.7 (H) pg      MCHC 35.7 g/dL      RDW 15 %      MPV 11.5 fL      Nucleated RBC 0.0 /100 WBC      Absolute NRBC 0.00 x10 3/uL     Lactic acid x2, sepsis [[id number]] Collected:  10/01/16 1028    Specimen:  Blood Updated:  10/01/16 1046     Lactic acid 1.6 mmol/L     Narrative:       Cancel if the initial lactate level is < 2.0 mmol/L.    Troponin I [[id number]] Collected:  10/01/16 0949    Specimen:  Blood Updated:  10/01/16 1037     Troponin I 0.03 ng/mL     B-type Natriuretic Peptide [[id number]]  (Abnormal) Collected:  10/01/16 0949    Specimen:  Blood Updated:  10/01/16 1036     B-Natriuretic Peptide 195.7 (H) pg/mL     GFR [[id number]] Collected:  10/01/16 0949     Updated:  10/01/16 1035     EGFR >60.0    Rapid influenza A/B antigens [[id number]] Collected:  10/01/16 0949    Specimen:  Nasopharyngeal from Nasal Aspirate Updated:  10/01/16 1014    Narrative:       ORDER#: [id number]                                    ORDERED BY: Tiajuana Amass  SOURCE: Nasal Aspirate                               COLLECTED:  10/01/16 09:49  ANTIBIOTICS AT COLL.:                                RECEIVED :  10/01/16 09:57  Influenza Rapid Antigen A&B                FINAL       10/01/16 10:14  10/01/16   Negative for Influenza A and B             Reference Range: Negative            Rads:   Radiological Procedure reviewed.   Radiology Results (24 Hour)     Procedure Component Value Units Date/Time    XR Chest  AP Portable [[id number]] Collected:  10/01/16 0959    Order Status:  Completed Updated:  10/01/16 1004    Narrative:       CLINICAL HISTORY: Chest pain    COMPARISON: Chest x-ray dated 09/30/2016    AP view of the chest was performed.    FINDINGS:   There is a left-sided pacemaker with the lead wires in appropriate  position.    The lungs are clear.  The  heart size and bones are within normal limits.      Impression:         No acute cardiopulmonary disease.    Annamary Carolin, MD   10/01/2016 10:00 AM  Signed by: Tyna Jaksch, MD 10/01/2016 5:42 PM

## 2016-10-01 NOTE — UM Notes (Signed)
70y/o male came into ER on 12/14 and admitted IP to medical on same day   Pnt w cough for a few days with exertional shortness of breath, cough, which is not very productive and chest tightness.  He also noticed husky voice, nasal drip and congestion, wheezing.  Patient reported vomiting last week after coughing spell.  The time of examination Patient went to see primary care physician, Dr. Altamease Oiler and started on Augmentin, inhaler and cough syrup with less tightness of the chest.  However, symptoms persisted and patient decided to come into the emergency department.  Patient denied any orthopnea or PND.  Patient stated that he was away at primary care physician office 213 pounds, which pretty much at his baseline.    Past Medical History:   Diagnosis Date   . AICD (automatic cardioverter/defibrillator) present 2014   . CAD (coronary artery disease)    . Cardiac defibrillator in situ    . CHF (congestive heart failure) 08/12/2014   . Diabetes mellitus    . Heart attack 2011    and 2014   . Hypertension    . Ischemic cardiomyopathy     20%     Past Surgical History:   Procedure Laterality Date   . BRING BACK OPEN HEART     . CARDIAC CATHETERIZATION     . duodenal ulcer  1973   . EGD N/A 08/15/2014    Procedure: EGD;  Surgeon: Colon Branch, MD;  Location: [facility];  Service: Gastroenterology;  Laterality: N/A;  egd w/ bx   . HERNIA REPAIR      [facility]   . orthopedic surgery      R foot corrective   . TONSILLECTOMY AND ADENOIDECTOMY  1954     VS: 98; p102; 90%RA currently on 2L; r20; 155/90; 0/10 pain     ER meds  Neb albuteral   Neb atrovent   IV rocephin   IV zithromax   PO risaquad   Sq lovenox     Abn Labs  hct 41.5   Rbc 4.27  Glu 191   Creat 1.4   glo 3.9  bnp 195.7  CXR: no acute abn     SINUS TACHYCARDIA  POSSIBLE ANTERIOR MYOCARDIAL INFARCTION , AGE UNDETERMINED  T WAVE ABNORMALITY, CONSIDER LATERAL ISCHEMIA  ABNORMAL ECG  WHEN COMPARED WITH ECG OF 20-Feb-2016 13:19,  NO SIGNIFICANT CHANGE WAS  FOUND    Pnt being admitted IP to medical for Bronchitis w possible CAP w mild respiratory insufficiency; AKI; elevated lactic acid level likely r/t dehydration giving AKI  duoneb q4   IV zithromax   IV rocephin   Sq lovenox  IV solumedral   Telemetry 24h   vsq4

## 2016-10-01 NOTE — Progress Notes (Signed)
patient admitted from ER at 1500. VSS. Patient wheezing. Neb tx done. Pul consult. No pain issues. Call bell within reach. All needs address. Wife at bedside and updated with plan of care which includes IV abx, nebs, steroids.

## 2016-10-01 NOTE — H&P (Signed)
[name] HOSPITALIST  H&P  Patient Info:   Date/Time: 10/01/2016 / 1:25 PM   Admit Date:10/01/2016  Patient Name:[name] [name]   KGU:[id number]   PCP: Zorita Pang, MD  Attending Physician:Potluri, Laroy Apple, MD     Assessment and Plan:   1. Bronchitis with possible community-acquired pneumonia with mild respiratory insufficiency  2 L nasal cannula.  No fever or leukocytosis  --- Continue antibiotic with Rocephin and azithromycin, probiotics  --- IV Solu-Medrol  --- Influenza test negative.  We will send for sputum culture, urine for Legionella antigen, mycoplasma titer  --- Pulmonary recommending outpatient pulmonary function tests to evaluate for asthma  --- Flonase nasal spray, Zyrtec  --- Check pro-calcitonin  --- Check troponins.  BNP only slightly elevated at 195  ---EKG showed T wave inversion on V5, appear chronic  --- Chest x-ray on September 30, 2016 showed upper lobe vascular prominence with peribronchial cuffing.  Asymmetry hazy right lower lung as type obesity may represent pneumonia.  Repeat chest x-ray on October 01, 2016 showed no acute pulmonary cardio process    2.  Acute kidney injury.  Creatinine of 1.4.  Patient baseline creatinine is 0.9  --We will monitor    3.  Elevated lactic acid level, likely due to dehydration giving acute kidney injury  --- Blood culture drawn  --- Check urinalysis  --- Follow-up lactic acid level  ---.  Encourage by mouth fluid.  Will be cautious with IV fluid giving his very low ejection fraction    4.  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  Mild to moderate mitral regurgitation.  Grade 2 diastolic dysfunction.  Mild pulmonary hypertension.  --- Check troponins  --On Coreg, spironolectone    5.  History of diabetes mellitus.  Patient take Janumet at home  --- We will continue Januvia 50 mg with Lantus  50 units evening  --- 1800 ADA diet and Accu-Chek.  HEENT chest slightly scaly insulin    6.  History of myocardial infarction in 2011.  Patient currently on aspirin, statin and beta blocker    7.  History of hypertension.  Blood pressure stays between 140-150  --- Continue normal max Coreg    Disposition continue steroids, antibiotics.  Follow-up lactic acid level.  Monitor renal function  Finding and plan discussed with patient          DVT Prohylaxis: Lovenox  Code Status: Prior  Disposition: home possible rehab  Type of Admission:Inpatient  Estimated Length of Stay (including stay in the ER receiving treatment): > 2 midnights  Medical Necessity for stay: Acute bronchitis with possible pneumonia  Hospital Problems:   Active Problems:    Dyspnea    Clinical Presentation:   History of Presenting Illness:   [name] [name] is a 70 y.o. male who has history of   Past Surgical History:   Procedure Laterality Date   . BRING BACK OPEN HEART     . CARDIAC CATHETERIZATION     . duodenal ulcer  1973   . EGD N/A 08/15/2014    Procedure: EGD;  Surgeon: Colon Branch, MD;  Location: [facility];  Service: Gastroenterology;  Laterality: N/A;  egd w/ bx   . HERNIA REPAIR      [facility]   . orthopedic surgery      R foot corrective   . TONSILLECTOMY AND ADENOIDECTOMY  1954  Past Medical History:   Diagnosis Date   . AICD (automatic cardioverter/defibrillator) present 2014   . CAD (coronary artery disease)    . Cardiac defibrillator in situ    . CHF (congestive heart failure) 08/12/2014   . Diabetes mellitus    . Heart attack 2011    and 2014   . Hypertension    . Ischemic cardiomyopathy     20%    came with the chief complaint of Chest Pain and Shortness of Breath    This is a very pleasant 70 year old gentleman with past medical history of 2.  Myopathy, ejection fraction of 20 percent status post implantable cardiac defibrillator placement, diabetes mellitus, coronary artery disease status post MI in 2011 and 2014,  hypertension, came in with cough for a few days with exertional shortness of breath, cough, which is not very productive and chest tightness.  He also noticed husky voice, nasal drip and congestion, wheezing.  Deny any fevers or chills, sore throat, chest pain.  Patient reported vomiting last week after coughing spell.  The time of examination Patient went to see primary care physician, Dr. Altamease Oiler and started on Augmentin, inhaler and cough syrup with less tightness of the chest.  However, symptoms persisted and patient decided to come into the emergency department.  Patient denied any orthopnea or PND.  Patient stated that he was away at primary care physician office 213 pounds, which pretty much at his baseline.  No sick contact or recent travel.  No leg swelling or pain.  Patient had a pneumonia shot last year, but has not have any flu shot just what this ER.  No dizziness, headache.  Syncope.  No more vomiting, abdominal pain, urinary symptom, diarrhea          Review of Systems:   Review of Systems   Constitutional: Negative for chills, fever and malaise/fatigue.   Respiratory: Positive for cough, sputum production, shortness of breath and wheezing.    Cardiovascular: Negative for chest pain, orthopnea and leg swelling.   Gastrointestinal: Negative for abdominal pain, constipation, diarrhea, nausea and vomiting.   Genitourinary: Negative for dysuria.   Neurological: Negative for dizziness, tingling, tremors, sensory change, speech change and headaches.   Psychiatric/Behavioral: Negative for depression and suicidal ideas. The patient is not nervous/anxious.      Physical Exam:     Vitals:    10/01/16 1053 10/01/16 1123 10/01/16 1225 10/01/16 1235   BP: 148/85 147/87 143/80 143/80   Pulse: 96 94 92 92   Resp:   18 16   Temp:    98 F (36.7 C)   TempSrc:       SpO2: 94% 94% 95% 95%   Weight:       Height:         Physical Exam   Constitutional: He is oriented to person, place, and time. No distress.   Eyes: No  scleral icterus.   Neck: No JVD present. No tracheal deviation present.   Cardiovascular: Normal rate and regular rhythm.    Murmur heard.  Pulmonary/Chest: He has wheezes. He has no rales.   Mild respiratory effort   Abdominal: Soft. Bowel sounds are normal. He exhibits no distension. There is no tenderness. There is no rebound.   Musculoskeletal: He exhibits no edema or tenderness.   Neurological: He is alert and oriented to person, place, and time. No cranial nerve deficit. GCS score is 15.   Psychiatric: Mood, memory, affect and judgment normal.  Clinical Information and History:   Chief Complaint:  Chief Complaint   Patient presents with   . Chest Pain   . Shortness of Breath     Past Medical History:  Past Medical History:   Diagnosis Date   . AICD (automatic cardioverter/defibrillator) present 2014   . CAD (coronary artery disease)    . Cardiac defibrillator in situ    . CHF (congestive heart failure) 08/12/2014   . Diabetes mellitus    . Heart attack 2011    and 2014   . Hypertension    . Ischemic cardiomyopathy     20%     Past Surgical History:  Past Surgical History:   Procedure Laterality Date   . BRING BACK OPEN HEART     . CARDIAC CATHETERIZATION     . duodenal ulcer  1973   . EGD N/A 08/15/2014    Procedure: EGD;  Surgeon: Colon Branch, MD;  Location: [facility];  Service: Gastroenterology;  Laterality: N/A;  egd w/ bx   . HERNIA REPAIR      [facility]   . orthopedic surgery      R foot corrective   . TONSILLECTOMY AND ADENOIDECTOMY  1954     Family History:  Family History   Problem Relation Age of Onset   . Breast cancer Mother    . Heart attack Mother 50   . Diabetes Brother      Social History:  History   Alcohol Use No     History   Drug Use No     History   Smoking Status   . Never Smoker   Smokeless Tobacco   . Never Used     Social History     Social History   . Marital status: Married     Spouse name: Lajoyce Corners   . Number of children: 0   . Years of education: N/A     Occupational  History   . 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 Smoking status: Never Smoker   . Smokeless tobacco: Never Used   . Alcohol use No   . Drug use: No   . Sexual activity: Yes     Other Topics Concern   . None     Social History Narrative   . None     Allergies:No Known Allergies  Medications:  (Not in a hospital admission)  Results of Labs/imaging   Labs have been reviewed:   Coagulation Profile:       CBC review:   Recent Labs  Lab 10/01/16  0949   WBC 10.38   Hgb 14.8   Hematocrit 41.5*   Platelets 253   MCV 97.2   RDW 15   Segmented Neutrophils 51     Chem Review:  Recent Labs  Lab 10/01/16  0949   Sodium 139   Potassium 5.0   Chloride 104   CO2 25   BUN 20.1   Creatinine 1.4*   Glucose 191*   Calcium 9.5   Bilirubin, Total 0.7   AST (SGOT) 17   ALT 15   Alkaline Phosphatase 93     Results     Procedure Component Value Units Date/Time    Procalcitonin [[id number]] Collected:  10/01/16 0943     Updated:  10/01/16 1242    Blood Culture Aerobic/Anaerobic #1 [[id number]] Collected:  10/01/16 0958    Specimen:  Arm from Blood Updated:  10/01/16 1212    Narrative:       1 BLUE+1 PURPLE    Blood Culture Aerobic/Anaerobic #2 [[id number]] Collected:  10/01/16 0949    Specimen:  Arm from Blood Updated:  10/01/16 1212    Narrative:       1 BLUE+1 PURPLE    Comprehensive metabolic panel [[id number]]  (Abnormal) Collected:  10/01/16 0949    Specimen:  Blood Updated:  10/01/16 1104     Glucose 191 (H) mg/dL      BUN 30.8 mg/dL      Creatinine 1.4 (H) mg/dL      Sodium 657 mEq/L      Potassium 5.0 mEq/L      Chloride 104 mEq/L      CO2 25 mEq/L      Calcium 9.5 mg/dL      Protein, Total 7.7 g/dL      Albumin 3.8 g/dL      AST (SGOT) 17 U/L      ALT 15 U/L      Alkaline Phosphatase 93 U/L      Bilirubin, Total 0.7 mg/dL      Globulin 3.9 (H) g/dL      Albumin/Globulin Ratio 1.0     Anion Gap 10.0    Manual Differential [[id number]]  (Abnormal) Collected:  10/01/16 0949     Updated:  10/01/16 1100      Segmented Neutrophils 51 %      Band Neutrophils 5 %      Lymphocytes Manual 22 %      Monocytes Manual 12 %      Eosinophils Manual 6 %      Basophils Manual 1 %      Atypical Lymph % 3 %      Abs Seg Manual 5.29 x10 3/uL      Bands Absolute 0.52 x10 3/uL      Absolute Lymph Manual 2.28 x10 3/uL      Monocytes Absolute 1.25 (H) x10 3/uL      Absolute Eos Manual 0.62 x10 3/uL      Absolute Baso Manual 0.10 x10 3/uL      Atypical Lymph Absolute 0.31 (H) x10 3/uL     Cell MorpHology [[id number]] Collected:  10/01/16 0949     Updated:  10/01/16 1100     Cell Morphology: Normal     Platelet Estimate Normal    CBC with differential [[id number]]  (Abnormal) Collected:  10/01/16 0949    Specimen:  Blood from Blood Updated:  10/01/16 1059     WBC 10.38 x10 3/uL      Hgb 14.8 g/dL      Hematocrit 40.3 (L) %      Platelets 253 x10 3/uL      RBC 4.27 (L) x10 6/uL      MCV 97.2 fL      MCH 34.7 (H) pg      MCHC 35.7 g/dL      RDW 15 %      MPV 11.5 fL      Nucleated RBC 0.0 /100 WBC      Absolute NRBC 0.00 x10 3/uL     Lactic acid x2, sepsis [[id number]] Collected:  10/01/16 1028    Specimen:  Blood Updated:  10/01/16 1046     Lactic acid 1.6 mmol/L     Narrative:       Cancel if the initial lactate level is < 2.0 mmol/L.    Troponin I [[id number]] Collected:  10/01/16 0949    Specimen:  Blood Updated:  10/01/16 1037     Troponin I 0.03 ng/mL     B-type Natriuretic Peptide [[id number]]  (Abnormal) Collected:  10/01/16 0949    Specimen:  Blood Updated:  10/01/16 1036     B-Natriuretic Peptide 195.7 (H) pg/mL     GFR [[id number]] Collected:  10/01/16 0949     Updated:  10/01/16 1035     EGFR >60.0    Rapid influenza A/B antigens [[id number]] Collected:  10/01/16 0949    Specimen:  Nasopharyngeal from Nasal Aspirate Updated:  10/01/16 1014    Narrative:       ORDER#: [id number]                                    ORDERED BY: Tiajuana Amass  SOURCE: Nasal Aspirate                               COLLECTED:  10/01/16 09:49  ANTIBIOTICS AT  COLL.:                                RECEIVED :  10/01/16 09:57  Influenza Rapid Antigen A&B                FINAL       10/01/16 10:14  10/01/16   Negative for Influenza A and B             Reference Range: Negative          Radiology reports have been reviewed:  Radiology Results (24 Hour)     Procedure Component Value Units Date/Time    XR Chest  AP Portable [[id number]] Collected:  10/01/16 0959    Order Status:  Completed Updated:  10/01/16 1004    Narrative:       CLINICAL HISTORY: Chest pain    COMPARISON: Chest x-ray dated 09/30/2016    AP view of the chest was performed.    FINDINGS:   There is a left-sided pacemaker with the lead wires in appropriate  position.    The lungs are clear.  The heart size and bones are within normal limits.      Impression:         No acute cardiopulmonary disease.    Annamary Carolin, MD   10/01/2016 10:00 AM        EKG: EKG reviewed , Normal sinus rhythm, T-wave abnormality in V5 and V6, but appeared to be chronic  Last EKG Result     Procedure Component Value Units Date/Time    ECG 12 Lead [[id number]] Collected:  10/01/16 0923     Updated:  10/01/16 0923     Ventricular Rate 101 BPM      Atrial Rate 101 BPM      P-R Interval 128 ms      QRS Duration 92 ms      Q-T Interval 368 ms      QTC Calculation (Bezet) 477 ms      P Axis 75 degrees      R Axis 28 degrees      T Axis 104 degrees     Narrative:       SINUS TACHYCARDIA  POSSIBLE ANTERIOR MYOCARDIAL INFARCTION , AGE UNDETERMINED  T WAVE ABNORMALITY, CONSIDER  LATERAL ISCHEMIA  ABNORMAL ECG  WHEN COMPARED WITH ECG OF 20-Feb-2016 13:19,  NO SIGNIFICANT CHANGE WAS FOUND    EKG SCAN [[id number]] Resulted:  10/01/16 1008     Updated:  10/01/16 1008           Hospitalist   Signed by:   Woodridge Dar Olsen Mccutchan  10/01/2016 1:25 PM    *This note was generated by the Epic EMR system/ Dragon speech recognition and may contain inherent errors or omissions not intended by the user. Grammatical errors, random word insertions, deletions, pronoun  errors and incomplete sentences are occasional consequences of this technology due to software limitations. Not all errors are caught or corrected. If there are questions or concerns about the content of this note or information contained within the body of this dictation they should be addressed directly with the author for clarification

## 2016-10-02 ENCOUNTER — Ambulatory Visit (INDEPENDENT_AMBULATORY_CARE_PROVIDER_SITE_OTHER): Payer: Commercial Managed Care - POS | Admitting: Family Medicine

## 2016-10-02 ENCOUNTER — Encounter (INDEPENDENT_AMBULATORY_CARE_PROVIDER_SITE_OTHER): Payer: Self-pay

## 2016-10-02 LAB — ECG 12-LEAD
Atrial Rate: 101 {beats}/min
P Axis: 75 degrees
P-R Interval: 128 ms
Q-T Interval: 368 ms
QRS Duration: 92 ms
QTC Calculation (Bezet): 477 ms
R Axis: 28 degrees
T Axis: 104 degrees
Ventricular Rate: 101 {beats}/min

## 2016-10-02 LAB — GLUCOSE WHOLE BLOOD - POCT
Whole Blood Glucose POCT: 277 mg/dL — ABNORMAL HIGH (ref 70–100)
Whole Blood Glucose POCT: 406 mg/dL — ABNORMAL HIGH (ref 70–100)
Whole Blood Glucose POCT: 258 mg/dL — ABNORMAL HIGH (ref 70–100)
Whole Blood Glucose POCT: 402 mg/dL — ABNORMAL HIGH (ref 70–100)

## 2016-10-02 LAB — PROCALCITONIN
Procalcitonin: 0.1 (ref 0.0–0.1)
Procalcitonin: 4.5 — ABNORMAL HIGH (ref 0.0–0.1)

## 2016-10-02 MED ORDER — GLUCOSE 40 % PO GEL
15.0000 g | ORAL | Status: DC | PRN
Start: 2016-10-02 — End: 2016-10-05

## 2016-10-02 MED ORDER — DEXTROSE 10 % IV BOLUS
125.0000 mL | INTRAVENOUS | Status: DC | PRN
Start: 2016-10-02 — End: 2016-10-05

## 2016-10-02 MED ORDER — GUAIFENESIN-DM 100-10 MG/5ML PO SYRP
5.0000 mL | ORAL_SOLUTION | ORAL | Status: DC | PRN
Start: 2016-10-02 — End: 2016-10-02
  Administered 2016-10-02: 5 mL via ORAL
  Filled 2016-10-02: qty 5

## 2016-10-02 MED ORDER — BENZOCAINE-MENTHOL 15-3.6 MG MT LOZG
1.0000 | LOZENGE | OROMUCOSAL | Status: DC | PRN
Start: 2016-10-02 — End: 2016-10-05
  Administered 2016-10-02 – 2016-10-04 (×3): 1 via BUCCAL
  Filled 2016-10-02 (×3): qty 1

## 2016-10-02 MED ORDER — GLUCAGON 1 MG IJ SOLR (WRAP)
1.0000 mg | INTRAMUSCULAR | Status: DC | PRN
Start: 2016-10-02 — End: 2016-10-05

## 2016-10-02 MED ORDER — INSULIN GLARGINE 100 UNIT/ML SC SOLN
15.0000 [IU] | Freq: Every evening | SUBCUTANEOUS | Status: DC
Start: 2016-10-02 — End: 2016-10-03
  Administered 2016-10-02: 15 [IU] via SUBCUTANEOUS
  Filled 2016-10-02: qty 15

## 2016-10-02 MED ORDER — INSULIN LISPRO 100 UNIT/ML SC SOLN
2.0000 [IU] | Freq: Three times a day (TID) | SUBCUTANEOUS | Status: DC | PRN
Start: 2016-10-02 — End: 2016-10-05
  Administered 2016-10-02: 6 [IU] via SUBCUTANEOUS
  Administered 2016-10-03 – 2016-10-04 (×3): 8 [IU] via SUBCUTANEOUS
  Administered 2016-10-04: 4 [IU] via SUBCUTANEOUS
  Administered 2016-10-05 (×2): 6 [IU] via SUBCUTANEOUS
  Administered 2016-10-05: 2 [IU] via SUBCUTANEOUS
  Filled 2016-10-02: qty 24
  Filled 2016-10-02: qty 18
  Filled 2016-10-02: qty 24
  Filled 2016-10-02: qty 12
  Filled 2016-10-02: qty 6
  Filled 2016-10-02: qty 24
  Filled 2016-10-02 (×2): qty 18

## 2016-10-02 MED ORDER — HYDROCODONE-HOMATROPINE 5-1.5 MG/5ML PO SYRP
5.0000 mL | ORAL_SOLUTION | Freq: Four times a day (QID) | ORAL | Status: AC
Start: 2016-10-02 — End: 2016-10-04
  Administered 2016-10-02 – 2016-10-04 (×7): 5 mL via ORAL
  Filled 2016-10-02 (×6): qty 5

## 2016-10-02 MED ORDER — GUAIFENESIN ER 600 MG PO TB12
600.0000 mg | ORAL_TABLET | Freq: Two times a day (BID) | ORAL | Status: DC
Start: 2016-10-02 — End: 2016-10-05
  Administered 2016-10-02 – 2016-10-05 (×6): 600 mg via ORAL
  Filled 2016-10-02 (×6): qty 1

## 2016-10-02 MED ORDER — INSULIN LISPRO 100 UNIT/ML SC SOLN
1.0000 [IU] | Freq: Every evening | SUBCUTANEOUS | Status: DC | PRN
Start: 2016-10-02 — End: 2016-10-05
  Administered 2016-10-02: 6 [IU] via SUBCUTANEOUS
  Administered 2016-10-03: 5 [IU] via SUBCUTANEOUS
  Administered 2016-10-04: 6 [IU] via SUBCUTANEOUS
  Filled 2016-10-02: qty 15
  Filled 2016-10-02 (×2): qty 18

## 2016-10-02 MED ORDER — METHYLPREDNISOLONE SODIUM SUCC 40 MG IJ SOLR
40.0000 mg | Freq: Three times a day (TID) | INTRAMUSCULAR | Status: DC
Start: 2016-10-03 — End: 2016-10-03
  Administered 2016-10-03 (×2): 40 mg via INTRAVENOUS
  Filled 2016-10-02 (×2): qty 1

## 2016-10-02 NOTE — Plan of Care (Signed)
Problem: Safety  Goal: Patient will be free from injury during hospitalization  Outcome: Progressing  Sr's up x2. BA on. Call bell within reach. Hourly rounding for safety.     Problem: Pain  Goal: Pain at adequate level as identified by patient  Pt denies pain. Continuing to monitor pt for s/s of pain and discomfort.

## 2016-10-02 NOTE — UM Notes (Signed)
Hello    REF# [id number]  Admission clinicals 10/01/16 - 10/02/16  C/b to [name] [phone]    Thank-you!        70y/o male came into ER on 12/14 and admitted IP to medical on same day   Pnt w cough for a few days with exertional shortness of breath, cough, which is not very productive and chest tightness. He also noticed husky voice, nasal drip and congestion, wheezing.  Patient reported vomiting last week after coughing spell.  The time of examination Patient went to see primary care physician, Dr. Lelon Frohlich started on Augmentin, inhaler and cough syrup with less tightness of the chest. However, symptoms persisted and patient decided to come into the emergency department. Patient denied any orthopnea or PND.    Past Medical History        Past Medical History:   Diagnosis Date   . AICD (automatic cardioverter/defibrillator) present 2014   . CAD (coronary artery disease)    . Cardiac defibrillator in situ    . CHF (congestive heart failure) 08/12/2014   . Diabetes mellitus    . Heart attack 2011    and 2014   . Hypertension    . Ischemic cardiomyopathy     20%        Past Surgical History         Past Surgical History:   Procedure Laterality Date   . BRING BACK OPEN HEART     . CARDIAC CATHETERIZATION     . duodenal ulcer  1973   . EGD N/A 08/15/2014    Procedure: EGD;  Surgeon: Colon Branch, MD;  Location: [facility];  Service: Gastroenterology;  Laterality: N/A;  egd w/ bx   . HERNIA REPAIR      [facility]   . orthopedic surgery      R foot corrective   . TONSILLECTOMY AND ADENOIDECTOMY  1954        VS: 98; p102; 90%RA currently on 2L; r20; 155/90; 0/10 pain     Height : 6' / Weight : 218 lbs    ER meds  Neb albuteral 2.5mg   Neb atrovent .5mg   IV rocephin 1g  IV zithromax 500mg   PO risaquad   Sq lovenox 40mg   02 2L NC    Abn Labs  hct 41.5   Rbc 4.27  Glu 191   Creat 1.4   glo 3.9  bnp 195.7  Lactic acid 2.2  CXR: no acute abn     Blood cx xs 2 : pending    SINUS  TACHYCARDIA  POSSIBLE ANTERIOR MYOCARDIAL INFARCTION , AGE UNDETERMINED  T WAVE ABNORMALITY, CONSIDER LATERAL ISCHEMIA  ABNORMAL ECG  WHEN COMPARED WITH ECG OF 20-Feb-2016 13:19,  NO SIGNIFICANT CHANGE WAS FOUND    Pnt being admitted IP to medical for Bronchitis w possible CAP w mild respiratory insufficiency; AKI; elevated lactic acid level likely r/t dehydration giving AKI  duoneb q4   IV zithromax 500mg  q24  IV rocephin 1g q24  Sq lovenox 40mg  daily  IV solumedral 40mg  QID  Telemetry 24h   vsq4  Diet as tol  supp O2  I/O  VS q4  Po Amlodipine, ASA, Coreg, Pepcid, Risaquad, Januvia, Aldactone  Prn DuoNeb, SS Insulin, prn IV Narcan  PULM consult          On tele 10/02/16 :  A/o, + coughing and SOB worse with exertion.    98.6, HR 89, RR 21, BP 126/86, sats 93% O2 2L NC  Glucose 406- 2units SQ HumuLin    DuoNeb given xs 2 so far today      Per PULM 10/02/16 :  Still has severe non productive cough with light headedness. Has less wheezing.   Plan:   Will decrease steroids.  Add Hycodan 5 ml q 6h scheduled x 48 hours.  Would switch him to Ceftin and Zithromax.  Wean of prednisone.   Hycodan prn after 48 hours.       MD assessment/plan 10/02/16 :  1. Bronchitis with community-acquired pneumonia with mild respiratory insufficiency 2 L nasal cannula. No fever or leukocytosis  ---Continue antibiotic with Rocephin and azithromycin, probiotics  ---IV Solu-Medrol  ---Influenza test negative. Sputum culture showed a few mixed respiratory flora, urine for Legionella antigen, mycoplasma titer  ---Pulmonary recommending outpatient pulmonary function tests to evaluate for asthma  ---Flonase nasal spray, Zyrtec, add Cepacol lozenges with Robitussin DM and mucinex for cough  ---Elevated  pro-calcitonin 4.5  ---Negative troponins. BNP only slightly elevated at 195  ---EKG showed T wave inversion on V5, appear chronic  ---Chest x-ray on September 30, 2016 showed upper lobe vascular prominence with peribronchial cuffing.  Asymmetry hazy right lower lung as type obesity may represent pneumonia. Repeat chest x-ray on October 01, 2016 showed no acute pulmonary cardio process    2. Acute kidney injury. Creatinine of 1.4. Patient baseline creatinine is 0.9  --We will monitor    3. Elevated lactic acid level, likely due to dehydration giving acute kidney injury  ---Blood culture drawn  ---Negative urinalysis  ---Follow-up lactic acid level  ---. Encourage by mouth fluid. Will be cautious with IV fluid giving his very low ejection fraction    4. 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 Mild to moderate mitral regurgitation. 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  ---negative troponins  --On Coreg, spironolectone    5. History of diabetes mellitus. Patient take Janumet at home  ---continue Januvia 50 mg with Lantus 50 units evening  ---1800 ADA diet and Accu-Chek. HEENT chest slightly scaly insulin    6. History of myocardial infarction in 2011. Patient currently on aspirin, statin and beta blocker    7. History of hypertension. Blood pressure stays between 140-150  ---Continue normal max Coreg

## 2016-10-02 NOTE — Plan of Care (Signed)
Found pt to be awake, alert and oriented sitting upright in bed. Pt coughing this morning noted, especially with exertion. Non-prod cough. Pt sob with exertion. O2 on 2l/m. Am assessment completed. LS CTA, no wheezing at this time.

## 2016-10-02 NOTE — Plan of Care (Signed)
Problem: Safety  Goal: Patient will be free from injury during hospitalization  Outcome: Progressing   10/02/16 0053   Goal/Interventions addressed this shift   Patient will be free from injury during hospitalization  Provide and maintain safe environment;Use appropriate transfer methods;Assess patient's risk for falls and implement fall prevention plan of care per policy;Ensure appropriate safety devices are available at the bedside;Hourly rounding;Include patient/ family/ care giver in decisions related to safety;Assess for patients risk for elopement and implement Elopement Risk Plan per policy   Bed alarm on. Call bell and bedside table within reach. Bed at lowest position. Hourly rounding ongoing.

## 2016-10-02 NOTE — Progress Notes (Signed)
Pulmonary Progress Note  "[facility]"    Date Time: 10/02/16 5:43 PM  Patient Name: [name] [name]  Attending Physician: Sunny Schlein, MD      Assessment:     Patient Active Problem List   Diagnosis   . Chest pain   . Diabetes mellitus   . Heart attack   . CAD (coronary artery disease)   . Non-ischemic cardiomyopathy   . Acute exacerbation of CHF (congestive heart failure)   . PUD (peptic ulcer disease)   . Elevated LFTs   . Acute dyspnea   . Elevated d-dimer   . CHF (congestive heart failure)   . Headaches due to old head injury   . Essential hypertension   . Pneumonia due to infectious organism, unspecified laterality, unspecified part of lung   . Syncope and collapse   . AICD (automatic cardioverter/defibrillator) present   . Syncope, unspecified syncope type   . Pneumonia due to infectious organism   . Dyspnea, unspecified type           Plan:   Will decrease steroids.  Add Hycodan 5 ml q 6h scheduled x 48 hours.  May be ready for discharge in 24- 48 hours if continues to improve.  Would switch him to Ceftin and Zithromax.  Wean of prednisone.   Hycodan prn after 48 hours.       Interval History   HPI: Subjectively better. Remains afebrile. Still has severe non productive cough with light headedness. Has less wheezing.           Physical Exam:   Vital signs for last 24 hours:  Temp:  [97.9 F (36.6 C)-98.6 F (37 C)] 97.9 F (36.6 C)  Heart Rate:  [81-100] 95  Resp Rate:  [18-21] 20  BP: (123-134)/(73-86) 130/73    Intake and Output Summary (Last 24 hours) at Date Time    Intake/Output Summary (Last 24 hours) at 10/02/16 1743  Last data filed at 10/01/16 2100   Gross per 24 hour   Intake              118 ml   Output              450 ml   Net             -332 ml       General: awake, alert, oriented x 3; no acute distress; able to speak in complete sentences without accessory muscle use; in   HEENT: pupils equal with EOMI; no thyromegaly, no cervical LN, no  thrush.  Cardiovascular: regular rate and rhythm, no murmurs, rubs or gallops. No P2/S2 present. No R sided heave. No JVD, no HJR.  Lungs: few bilateral  Rhonchi and  rales.  Abdomen: soft, non-tender, non-distended; no palpable masses, no hepatosplenomegaly, normoactive bowel sounds, no rebound or guarding  Extremities: no clubbing, cyanosis, or edema.  Neuro: Normal sensory and motor systems. Able to ambulate.  Derm: No rashes.  Musculoskeletal: nl ROM, no muscle weakness.      Meds:   Scheduled Meds:  Current Facility-Administered Medications   Medication Dose Route Frequency   . albuterol-ipratropium  3 mL Nebulization Q4H WA   . amLODIPine-valsartan (EXFORGE) combination tablet 10-320 mg   Oral Daily   . aspirin  81 mg Oral Daily   . azithromycin  500 mg Intravenous Q24H SCH   . carvedilol  25 mg Oral Q12H SCH   . cefTRIAXone  1 g Intravenous Q24H  SCH   . enoxaparin  40 mg Subcutaneous Daily   . famotidine  20 mg Oral Q12H SCH   . guaiFENesin  600 mg Oral Q12H SCH   . HYDROcodone-homatropine  5 mL Oral 4 times per day   . insulin glargine  15 Units Subcutaneous QHS   . lactobacillus/streptococcus  1 capsule Oral Daily   . [START ON 10/03/2016] methylPREDNISolone  40 mg Intravenous Q8H   . niacin  500 mg Oral QHS   . SITagliptin  50 mg Oral Daily   . spironolactone  25 mg Oral BID     Continuous Infusions:   PRN Meds:.albuterol-ipratropium, benzocaine-menthol, Nursing communication: Adult Hypoglycemia Treatment Algorithm **AND** dextrose **AND** dextrose **AND** glucagon (rDNA), insulin lispro, insulin lispro, naloxone, zolpidem      Labs:     Results     Procedure Component Value Units Date/Time    Glucose Whole Blood - POCT [[id number]]  (Abnormal) Collected:  10/02/16 1656     Updated:  10/02/16 1713     POCT - Glucose Whole blood 258 (H) mg/dL     Procalcitonin [[id number]] Collected:  10/01/16 0943     Updated:  10/02/16 1413     Procalcitonin 0.1    Procalcitonin [[id number]]  (Abnormal) Collected:  10/02/16  0501     Updated:  10/02/16 1410     Procalcitonin 4.5 (H)    Blood Culture Aerobic/Anaerobic #1 [[id number]] Collected:  10/01/16 0958    Specimen:  Arm from Blood Updated:  10/02/16 1221    Narrative:       ORDER#: [id number]                                    ORDERED BY: POTLURI, JAGADI  SOURCE: Blood LFA                                    COLLECTED:  10/01/16 09:58  ANTIBIOTICS AT COLL.:                                RECEIVED :  10/01/16 12:12  Culture Blood Aerobic and Anaerobic        PRELIM      10/02/16 12:21  10/02/16   No Growth after 1 day/s of incubation.      Blood Culture Aerobic/Anaerobic #2 [[id number]] Collected:  10/01/16 0949    Specimen:  Arm from Blood Updated:  10/02/16 1221    Narrative:       ORDER#: [id number]                                    ORDERED BY: POTLURI, JAGADI  SOURCE: Blood RAC                                    COLLECTED:  10/01/16 09:49  ANTIBIOTICS AT COLL.:                                RECEIVED :  10/01/16 12:12  Culture Blood Aerobic and Anaerobic        PRELIM  10/02/16 12:21  10/02/16   No Growth after 1 day/s of incubation.      Glucose Whole Blood - POCT [[id number]]  (Abnormal) Collected:  10/02/16 1146     Updated:  10/02/16 1203     POCT - Glucose Whole blood 406 (H) mg/dL     Glucose Whole Blood - POCT [[id number]]  (Abnormal) Collected:  10/02/16 0715     Updated:  10/02/16 0742     POCT - Glucose Whole blood 277 (H) mg/dL     Legionella antigen, urine [[id number]] Collected:  10/01/16 2224    Specimen:  Urine from Urine, Clean Catch Updated:  10/02/16 0619    Narrative:       ORDER#: [id number]                                    ORDERED BY: AYE, [place] D  SOURCE: Urine, Clean Catch                           COLLECTED:  10/01/16 22:24  ANTIBIOTICS AT COLL.:                                RECEIVED :  10/02/16 02:41  Legionella, Rapid Urinary Antigen          FINAL       10/02/16 06:19  10/02/16   Negative for Legionella pneumophila Serogroup 1 Antigen              Limitations of Test:             1. Negative results do not exclude infection with Legionella                pneumophila Serogroup 1.             2. Does not detect other serogroups of L. pneumophila                or other Legionella species.             Test Reference Range: Negative      MYCOPLASMA PNEUMONIAE AB IGM, EIA [[id number]] Collected:  10/02/16 0456     Updated:  10/02/16 0619    STREP PNEUMONIA, RAPID URINARY ANTIGEN [[id number]] Collected:  10/01/16 2223    Specimen:  Urine, Clean Catch Updated:  10/02/16 0619    Narrative:       ORDER#: [id number]                                    ORDERED BY: Faythe Casa, [place] D  SOURCE: Urine, Clean Catch                           COLLECTED:  10/01/16 22:23  ANTIBIOTICS AT COLL.:                                RECEIVED :  10/02/16 02:41  S. pneumoniae, Rapid Urinary Antigen       FINAL       10/02/16 06:18  10/02/16   Negative for Streptococcus pneumoniae Urinary Antigen  Note:             This is a presumptive test for the direct qualitative             detection of bacterial antigen. This test is not intended as             a substitute for a gram stain and bacterial culture. Samples             with extremely low levels of antigen may yield negative             results.             Reference Range: Negative      CULTURE + Dierdre Forth [[id number]] Collected:  10/01/16 2110    Specimen:  Sputum from Sputum, Expectorated Updated:  10/02/16 0346    Narrative:       ORDER#: [id number]                                    ORDERED BY: Sharen Counter  SOURCE: Sputum, Expectorated lungs                   COLLECTED:  10/01/16 21:10  ANTIBIOTICS AT COLL.:                                RECEIVED :  10/02/16 02:41  Stain, Gram (Respiratory)                  FINAL       10/02/16 03:46  10/02/16   Few WBC's             No Epithelial cells             Few Mixed Respiratory Flora  Culture and Gram Stain, Aerobic, RespiratorPENDING      Troponin I [[id number]]  Collected:  10/01/16 2221    Specimen:  Blood Updated:  10/01/16 2251     Troponin I 0.02 ng/mL     UA, Reflex to Microscopic [[id number]]  (Abnormal) Collected:  10/01/16 2223     Updated:  10/01/16 2245     Urine Type UNKNOWN     Color, UA Yellow     Clarity, UA Sl Cloudy (A)     Specific Gravity UA 1.029     Urine pH 5.0     Leukocyte Esterase, UA Negative     Nitrite, UA Negative     Protein, UR 30 (A)     Glucose, UA 50 (A)     Ketones UA Negative     Urobilinogen, UA 2.0 mg/dL      Bilirubin, UA Negative     Blood, UA Negative     RBC, UA 3 - 5 /hpf      WBC, UA 0-5 /hpf      Squamous Epithelial Cells, Urine 0-5 /hpf     GROUP A STREP, RAPID ANTIGEN [[id number]] Collected:  10/01/16 2110    Specimen:  Throat Updated:  10/01/16 2122     Group A Strep, Rapid Antigen Negative    Glucose Whole Blood - POCT [[id number]]  (Abnormal) Collected:  10/01/16 2020     Updated:  10/01/16 2044     POCT - Glucose Whole blood 234 (H) mg/dL             Radiology:  Radiology Results (24 Hour)     ** No results found for the last 24 hours. **            Signed by: Kendra Opitz, MD  Date/Time: 10/02/2016 5:43 PM

## 2016-10-02 NOTE — Progress Notes (Signed)
10/02/16 1513   Patient Type   Within 30 Days of Previous Admission? No   Healthcare Decisions   Interviewed: Patient   Orientation/Decision Making Abilities of Patient Alert and Oriented x3, able to make decisions   Additional Emergency Contacts? same on file   Prior to admission   Prior level of function Independent with ADLs;Ambulates independently   Home Layout Two level   Have running water, electricity, heat, etc? Yes   How do you get to your MD appointments? Pateint drives   Adult Management consultant (APS) involved? No   Discharge Planning   Patient expects to be discharged to: home with no need.    Anticipated [place] plan discussed with: Patient   Potential barriers to discharge: Testing/procedure   Mode of transportation: Private car (family member)   Consults/Providers   Correct PCP listed in Epic? Yes   Important Message from [facility] Notice   Patient received 1st IMM Letter? n/a

## 2016-10-02 NOTE — Progress Notes (Signed)
[name]  Progress Note  Patient Info:   Date/Time: 10/02/2016 / 6:01 PM   Admit Date:10/01/2016  Patient Name:[name] [name]   ZHY:[id number]   PCP: Zorita Pang, MD  Attending Physician:Karely Hurtado, Betsey Holiday, MD     Subjective:   10/02/16     Shortness of breath much better but coughing more. No chest pain.      Chief Complaint:  Chest Pain and Shortness of Breath  Update of Review of Systems:  Review of Systems   Constitutional: Negative for chills and fever.   Respiratory: Positive for cough and shortness of breath. Negative for wheezing.    Cardiovascular: Negative for chest pain and leg swelling.   Gastrointestinal: Negative for abdominal pain, nausea and vomiting.     Assessment and Plan:   1. Bronchitis with community-acquired pneumonia with mild respiratory insufficiency  2 L nasal cannula.  No fever or leukocytosis  --- Continue antibiotic with Rocephin and azithromycin, probiotics  --- IV Solu-Medrol  --- Influenza test negative.  Sputum culture showed a few mixed respiratory flora, urine for Legionella antigen, mycoplasma titer  --- Pulmonary recommending outpatient pulmonary function tests to evaluate for asthma  --- Flonase nasal spray, Zyrtec, add Cepacol lozenges with Robitussin DM and mucinex for cough  --- Elevated  pro-calcitonin 4.5  --- Negative troponins.  BNP only slightly elevated at 195  ---EKG showed T wave inversion on V5, appear chronic  --- Chest x-ray on September 30, 2016 showed upper lobe vascular prominence with peribronchial cuffing.  Asymmetry hazy right lower lung as type obesity may represent pneumonia.  Repeat chest x-ray on October 01, 2016 showed no acute pulmonary cardio process    2.  Acute kidney injury.  Creatinine of 1.4.  Patient baseline creatinine is 0.9  --We will monitor    3.  Elevated lactic acid level, likely due to dehydration giving acute kidney injury  --- Blood culture drawn  --- Negative urinalysis  --- Follow-up lactic acid level  ---.   Encourage by mouth fluid.  Will be cautious with IV fluid giving his very low ejection fraction    4.  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  Mild to moderate mitral regurgitation.  Grade 2 diastolic dysfunction.  Mild pulmonary hypertension.  --- negative troponins  --On Coreg, spironolectone    5.  History of diabetes mellitus.  Patient take Janumet at home  ---continue Januvia 50 mg with Lantus 50 units evening  --- 1800 ADA diet and Accu-Chek.  HEENT chest slightly scaly insulin    6.  History of myocardial infarction in 2011.  Patient currently on aspirin, statin and beta blocker    7.  History of hypertension.  Blood pressure stays between 140-150  --- Continue normal max Coreg    Disposition continue steroids, antibiotics.  Follow-up lactic acid level.  Monitor renal function  Finding and plan discussed with patient      DVT Prohylaxis:lovvenox  Central Line/Foley Catheter/PICC line status: none  Code Status: Full Code  Disposition:home  Type of Admission:Inpatient  Anticipated Length of Stay:  > 2 midnights  Medical Necessity for stay: Pneumonia with bronchitis  Hospital Problems:   Active Problems:    Dyspnea, unspecified type    Objective:     Vitals:    10/02/16 0934 10/02/16 1124 10/02/16 1424 10/02/16 1534   BP: 126/82  130/73  Pulse: 83  95    Resp: 20  20    Temp: 97.9 F (36.6 C)  97.9 F (36.6 C)    TempSrc:       SpO2: 93% 97% 94% 94%   Weight:       Height:         Physical Exam:   Physical Exam   Constitutional: He is oriented to person, place, and time.   HENT:   Mouth/Throat: No oropharyngeal exudate.   Cardiovascular: Normal rate and regular rhythm.    No murmur heard.  Pulmonary/Chest: Effort normal. No respiratory distress. He has wheezes. He has no rales.   Abdominal: Soft. Bowel sounds are normal. He exhibits no distension. There is no  tenderness. There is no rebound and no guarding.   Musculoskeletal: He exhibits no edema or tenderness.   Neurological: He is alert and oriented to person, place, and time. No cranial nerve deficit. Coordination normal. GCS score is 15.   Skin: Skin is warm.   Psychiatric: Memory, affect and judgment normal.     Results of Labs/imaging   Labs and radiology reports have been reviewed.    Hospitalist   Signed by:   [place] Dar Nina Mondor  10/02/2016 6:01 PM    *This note was generated by the Epic EMR system/ Dragon speech recognition and may contain inherent errors or omissions not intended by the user. Grammatical errors, random word insertions, deletions, pronoun errors and incomplete sentences are occasional consequences of this technology due to software limitations. Not all errors are caught or corrected. If there are questions or concerns about the content of this note or information contained within the body of this dictation they should be addressed directly with the author for clarification

## 2016-10-03 LAB — CELL MORPHOLOGY
Cell Morphology: ABNORMAL — AB
Platelet Estimate: NORMAL

## 2016-10-03 LAB — CBC AND DIFFERENTIAL
Absolute NRBC: 0 10*3/uL
Hematocrit: 40.4 % — ABNORMAL LOW (ref 42.0–52.0)
Hgb: 13.8 g/dL (ref 13.0–17.0)
MCH: 31.9 pg (ref 28.0–32.0)
MCHC: 34.2 g/dL (ref 32.0–36.0)
MCV: 93.5 fL (ref 80.0–100.0)
MPV: 12.3 fL (ref 9.4–12.3)
Nucleated RBC: 0 /100 WBC (ref 0.0–1.0)
Platelets: 264 10*3/uL (ref 140–400)
RBC: 4.32 10*6/uL — ABNORMAL LOW (ref 4.70–6.00)
RDW: 13 % (ref 12–15)
WBC: 15.76 10*3/uL — ABNORMAL HIGH (ref 3.50–10.80)

## 2016-10-03 LAB — BASIC METABOLIC PANEL
Anion Gap: 9 (ref 5.0–15.0)
BUN: 35.1 mg/dL — ABNORMAL HIGH (ref 9.0–28.0)
CO2: 19 mEq/L — ABNORMAL LOW (ref 22–29)
Calcium: 8.9 mg/dL (ref 8.5–10.5)
Chloride: 106 mEq/L (ref 100–111)
Creatinine: 1.4 mg/dL — ABNORMAL HIGH (ref 0.7–1.3)
Glucose: 343 mg/dL — ABNORMAL HIGH (ref 70–100)
Potassium: 5.6 mEq/L — ABNORMAL HIGH (ref 3.5–5.1)
Sodium: 134 mEq/L — ABNORMAL LOW (ref 136–145)

## 2016-10-03 LAB — GLUCOSE WHOLE BLOOD - POCT
Whole Blood Glucose POCT: 336 mg/dL — ABNORMAL HIGH (ref 70–100)
Whole Blood Glucose POCT: 344 mg/dL — ABNORMAL HIGH (ref 70–100)
Whole Blood Glucose POCT: 413 mg/dL — ABNORMAL HIGH (ref 70–100)
Whole Blood Glucose POCT: 342 mg/dL — ABNORMAL HIGH (ref 70–100)

## 2016-10-03 LAB — MAN DIFF ONLY
Band Neutrophils Absolute: 0.47 10*3/uL (ref 0.00–1.00)
Band Neutrophils: 3 %
Basophils Absolute Manual: 0 10*3/uL (ref 0.00–0.20)
Basophils Manual: 0 %
Eosinophils Absolute Manual: 0 10*3/uL (ref 0.00–0.70)
Eosinophils Manual: 0 %
Lymphocytes Absolute Manual: 2.21 10*3/uL (ref 0.50–4.40)
Lymphocytes Manual: 14 %
Monocytes Absolute: 0 10*3/uL (ref 0.00–1.20)
Monocytes Manual: 0 %
Neutrophils Absolute Manual: 13.08 10*3/uL — ABNORMAL HIGH (ref 1.80–8.10)
Segmented Neutrophils: 83 %

## 2016-10-03 LAB — MAGNESIUM: Magnesium: 2.2 mg/dL (ref 1.6–2.6)

## 2016-10-03 LAB — GFR: EGFR: >60

## 2016-10-03 LAB — POTASSIUM: Potassium: 5.4 mEq/L — ABNORMAL HIGH (ref 3.5–5.1)

## 2016-10-03 MED ORDER — METHYLPREDNISOLONE SODIUM SUCC 40 MG IJ SOLR
40.0000 mg | Freq: Two times a day (BID) | INTRAMUSCULAR | Status: DC
Start: 2016-10-03 — End: 2016-10-05
  Administered 2016-10-03 – 2016-10-04 (×3): 40 mg via INTRAVENOUS
  Filled 2016-10-03 (×3): qty 1

## 2016-10-03 MED ORDER — FUROSEMIDE 10 MG/ML IJ SOLN
20.0000 mg | Freq: Once | INTRAMUSCULAR | Status: DC
Start: 2016-10-03 — End: 2016-10-03

## 2016-10-03 MED ORDER — INSULIN GLARGINE 100 UNIT/ML SC SOLN
15.0000 [IU] | Freq: Two times a day (BID) | SUBCUTANEOUS | Status: DC
Start: 2016-10-03 — End: 2016-10-03
  Administered 2016-10-03: 15 [IU] via SUBCUTANEOUS
  Filled 2016-10-03: qty 15

## 2016-10-03 MED ORDER — INSULIN LISPRO 100 UNIT/ML SC SOLN
10.0000 [IU] | Freq: Once | SUBCUTANEOUS | Status: AC
Start: 2016-10-03 — End: 2016-10-03
  Administered 2016-10-03: 10 [IU] via SUBCUTANEOUS

## 2016-10-03 MED ORDER — INSULIN GLARGINE 100 UNIT/ML SC SOLN
50.0000 [IU] | Freq: Every evening | SUBCUTANEOUS | Status: DC
Start: 2016-10-03 — End: 2016-10-05
  Administered 2016-10-03 – 2016-10-04 (×2): 50 [IU] via SUBCUTANEOUS
  Filled 2016-10-03 (×2): qty 50

## 2016-10-03 MED ORDER — SODIUM POLYSTYRENE SULFONATE 15 GM/60ML PO SUSP
45.0000 g | Freq: Once | ORAL | Status: AC
Start: 2016-10-03 — End: 2016-10-03
  Administered 2016-10-03: 45 g via ORAL
  Filled 2016-10-03: qty 180

## 2016-10-03 NOTE — Plan of Care (Signed)
Problem: Safety  Goal: Patient will be free from injury during hospitalization  Outcome: Progressing   10/03/16 0119   Goal/Interventions addressed this shift   Patient will be free from injury during hospitalization  Assess patient's risk for falls and implement fall prevention plan of care per policy;Provide and maintain safe environment;Use appropriate transfer methods;Ensure appropriate safety devices are available at the bedside;Include patient/ family/ care giver in decisions related to safety;Hourly rounding   Call bell and personal belongings within reach, bed alarms on, non-skid socks on. Family at bedside.    Problem: Pain  Goal: Pain at adequate level as identified by patient   10/03/16 0119   Goal/Interventions addressed this shift   Pain at adequate level as identified by patient Identify patient comfort function goal;Assess for risk of opioid induced respiratory depression, including snoring/sleep apnea. Alert healthcare team of risk factors identified.;Assess pain on admission, during daily assessment and/or before any "as needed" intervention(s);Reassess pain within 30-60 minutes of any procedure/intervention, per Pain Assessment, Intervention, Reassessment (AIR) Cycle;Evaluate if patient comfort function goal is met;Evaluate patient's satisfaction with pain management progress       Problem: Inadequate Gas Exchange  Goal: Adequate oxygenation and improved ventilation  Outcome: Progressing   10/03/16 0119   Goal/Interventions addressed this shift   Adequate oxygenation and improved ventilation Assess lung sounds;Monitor SpO2 and treat as needed;Provide mechanical and oxygen support to facilitate gas exchange;Position for maximum ventilatory efficiency;Plan activities to conserve energy: plan rest periods   Maintained on 2L NC to maintain O2 saturation above 94%, nebulizer treatment as scheduled.

## 2016-10-03 NOTE — Progress Notes (Addendum)
[name]  Progress Note  Patient Info:   Date/Time: 10/03/2016 / 6:05 PM   Admit Date:10/01/2016  Patient Name:[name] [name]   GLO:[id number]   PCP: Zorita Pang, MD  Attending Physician:Araf Clugston, Betsey Holiday, MD     Subjective:   10/03/16     Shortness of breath much better but coughing more. No chest pain.      Chief Complaint:  Chest Pain and Shortness of Breath  Update of Review of Systems:  Review of Systems   Constitutional: Negative for chills and fever.   Respiratory: Positive for cough and shortness of breath. Negative for wheezing.    Cardiovascular: Negative for chest pain and leg swelling.   Gastrointestinal: Negative for abdominal pain, nausea and vomiting.     Assessment and Plan:   1. Bronchitis with community-acquired pneumonia with mild respiratory insufficiency  2 L nasal cannula.  No fever. WBC trending up, likely steroid induced  --- Continue antibiotic with Rocephin and azithromycin, probiotics  --- IV Solu-Medrol.  Reduce dose to 40 mg 3 times a day given.  Patient is better and blood sugar remained high.   --- Influenza test negative.  Sputum culture showed a few mixed respiratory flora, urine for Legionella antigen negative mycoplasma titer pending  --- Pulmonary recommending outpatient pulmonary function tests to evaluate for asthma  --- Flonase nasal spray, Zyrtec, add Cepacol lozenges with Robitussin DM and mucinex for cough, Hycodan  --- Elevated  pro-calcitonin 4.5  --- Negative troponins.  BNP only slightly elevated at 195  ---EKG showed T wave inversion on V5, appear chronic  --- Chest x-ray on September 30, 2016 showed upper lobe vascular prominence with peribronchial cuffing.  Asymmetry hazy right lower lung as type obesity may represent pneumonia.  Repeat chest x-ray on October 01, 2016 showed no acute pulmonary cardio process.  Repeat chest x-ray tomorrow    2.  Hoarseness of voice.  Patient and wife thinks it is his baseline  --- Asked patient and wife to  follow up with ENT to rule out any vocal cord or laryngeal pathology    3.  Acute kidney injury.  Creatinine of 1.4.  Creatinine has been the same at 1.4  Patient baseline creatinine is 0.9  --We will monitor    4.  Hyperkalemia of 5.6.  Mild metabolic acidosis of bicarbonate of 19.  --- Stop Spironolactone  --- Give Kayexalate  --- Repeat level    5.  Elevated lactic acid level, likely due to dehydration giving acute kidney injury  --- Blood culture update negative  --- Negative urinalysis  --- Follow-up lactic acid level  ---.  Encourage by mouth fluid.  Will be cautious with IV fluid giving his very low ejection fraction    6.  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  Mild to moderate mitral regurgitation.  Grade 2 diastolic dysfunction.  Mild pulmonary hypertension.  --- negative troponins  --On Coreg, spironolectone  --- No sign of fluid overload at this time    7.  History of diabetes mellitus.  Uncontrolled as patient is on steroid    ---continue Januvia 50 mg with Lantus 50 units evening.  High scale.  Sliding scale insulin  --- 1800 ADA diet and Accu-Chek.     8.  History of myocardial infarction in 2011.  Patient currently on aspirin, statin and beta blocker  9.  History of hypertension.  Blood pressure stays between 140-150  --- Continue normal max Coreg    Disposition continue steroids, antibiotics.  Follow-up lactic acid level.  Monitor renal function  Finding and plan discussed with patient and wife at bedside.       DVT Prohylaxis:lovenox  Central Line/Foley Catheter/PICC line status: none  Code Status: Full Code  Disposition:home  Type of Admission:Inpatient  Anticipated Length of Stay:  > 2 midnights  Medical Necessity for stay: Pneumonia with bronchitis  Hospital Problems:   Active Problems:    Dyspnea, unspecified type    Objective:     Vitals:     10/03/16 0725 10/03/16 0852 10/03/16 1406 10/03/16 1759   BP:  136/87 123/79 145/81   Pulse: 89 94 84 99   Resp: 20 22 20 18    Temp:  97.9 F (36.6 C) 97.7 F (36.5 C) 97.9 F (36.6 C)   TempSrc:       SpO2: 95% 95% 95% 95%   Weight:       Height:         Physical Exam:   Physical Exam   Constitutional: He is oriented to person, place, and time.   HENT:   Mouth/Throat: No oropharyngeal exudate.   Hoarseness of voice, direct throat exam no sign of acute inflammation   Cardiovascular: Normal rate and regular rhythm.    No murmur heard.  Pulmonary/Chest: Effort normal. No respiratory distress. He has no wheezes. He has rales.   Abdominal: Soft. Bowel sounds are normal. He exhibits no distension. There is no tenderness. There is no rebound and no guarding.   Musculoskeletal: He exhibits no edema or tenderness.   Neurological: He is alert and oriented to person, place, and time. No cranial nerve deficit. Coordination normal. GCS score is 15.   Skin: Skin is warm.   Psychiatric: Memory, affect and judgment normal.     Results of Labs/imaging   Labs and radiology reports have been reviewed.    Hospitalist   Signed by:   [id number] [place] Else Jonovan Boedecker  10/03/2016 6:05 PM    *This note was generated by the Epic EMR system/ Dragon speech recognition and may contain inherent errors or omissions not intended by the user. Grammatical errors, random word insertions, deletions, pronoun errors and incomplete sentences are occasional consequences of this technology due to software limitations. Not all errors are caught or corrected. If there are questions or concerns about the content of this note or information contained within the body of this dictation they should be addressed directly with the author for clarification

## 2016-10-03 NOTE — Plan of Care (Signed)
Problem: Inadequate Gas Exchange  Goal: Adequate oxygenation and improved ventilation  Outcome: Progressing   10/03/16 0848   Goal/Interventions addressed this shift   Adequate oxygenation and improved ventilation Assess lung sounds;Provide mechanical and oxygen support to facilitate gas exchange;Monitor SpO2 and treat as needed;Plan activities to conserve energy: plan rest periods;Position for maximum ventilatory efficiency   Pt stated that he has less pain in his throat and that his cough is improving

## 2016-10-03 NOTE — Progress Notes (Signed)
Pulmonary Progress Note  "[facility]"    Date Time: 10/03/16 3:53 PM  Patient Name: [name] [name]  Attending Physician: Sunny Schlein, MD      Assessment:   -Suspect  CAP.  -Bronchospasm and  wheezing and dyspnea related to above.  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 No known h/o lung disease.   -Hypoxia: on 3 LPM. 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    Patient Active Problem List   Diagnosis   . Chest pain   . Diabetes mellitus   . Heart attack   . CAD (coronary artery disease)   . Non-ischemic cardiomyopathy   . Acute exacerbation of CHF (congestive heart failure)   . PUD (peptic ulcer disease)   . Elevated LFTs   . Acute dyspnea   . Elevated d-dimer   . CHF (congestive heart failure)   . Headaches due to old head injury   . Essential hypertension   . Pneumonia due to infectious organism, unspecified laterality, unspecified part of lung   . Syncope and collapse   . AICD (automatic cardioverter/defibrillator) present   . Syncope, unspecified syncope type   . Pneumonia due to infectious organism   . Dyspnea, unspecified type       Plan:   Taper solumderol to 40 mg iv bid.   Assess for home oxygen  Wean O2 to keep Sat>90%  Give hycodan prn.   PFT as an outpt  Interval History     Interval History/24 hr. Events: less cough and SOB  I spoke with the nursing staff and RT about his condition.     Physical Exam:   Vital signs for last 24 hours:  Temp:  [97.7 F (36.5 C)-97.9 F (36.6 C)] 97.7 F (36.5 C)  Heart Rate:  [84-105] 84  Resp Rate:  [18-22] 20  BP: (107-151)/(67-96) 123/79    Intake and Output Summary (Last 24 hours) at Date Time  No intake or output data in the 24 hours ending 10/03/16 1553    General: awake, alert, oriented x 3; no acute  distress;  HEENT: PERR; no cervical LN, no thrush.  Cardiovascular: regular rate and rhythm, no murmurs, rubs or gallops.  Lungs: Has unlabored breathing. Reduced air entry, + wheezing, crackles in bases  Abdomen: soft, non-tender, non-distended;  no hepatosplenomegaly,   Extremities: no  edema.  Neuro: Normal sensory and motor systems.         Meds:   Scheduled Meds:  Current Facility-Administered Medications   Medication Dose Route Frequency   . albuterol-ipratropium  3 mL Nebulization Q4H WA   . amLODIPine-valsartan (EXFORGE) combination tablet 10-320 mg   Oral Daily   . aspirin  81 mg Oral Daily   . azithromycin  500 mg Intravenous Q24H SCH   . carvedilol  25 mg Oral Q12H SCH   . cefTRIAXone  1 g Intravenous Q24H SCH   . enoxaparin  40 mg Subcutaneous Daily   . famotidine  20 mg Oral Q12H SCH   . guaiFENesin  600 mg Oral Q12H SCH   . HYDROcodone-homatropine  5 mL Oral 4 times per day   . insulin glargine  15 Units Subcutaneous Q12H   . lactobacillus/streptococcus  1 capsule Oral Daily   . methylPREDNISolone  40 mg Intravenous Q8H SCH   . niacin  500 mg Oral QHS   . SITagliptin  50 mg Oral Daily     Continuous Infusions:   PRN Meds:.albuterol-ipratropium, benzocaine-menthol, Nursing communication: Adult Hypoglycemia Treatment Algorithm **AND** dextrose **AND** dextrose **AND** glucagon (rDNA), insulin lispro, insulin lispro, naloxone, zolpidem      Labs:     Results     Procedure Component Value Units Date/Time    Blood Culture Aerobic/Anaerobic #1 [[id number]] Collected:  10/01/16 0958    Specimen:  Arm from Blood Updated:  10/03/16 1221    Narrative:       ORDER#: [id number]                                    ORDERED BY: POTLURI, JAGADI  SOURCE: Blood LFA                                    COLLECTED:  10/01/16 09:58  ANTIBIOTICS AT COLL.:                                RECEIVED :  10/01/16 12:12  Culture Blood Aerobic and Anaerobic        PRELIM      10/03/16 12:21  10/02/16   No Growth after 1 day/s of  incubation.  10/03/16   No Growth after 2 day/s of incubation.      Blood Culture Aerobic/Anaerobic #2 [[id number]] Collected:  10/01/16 0949    Specimen:  Arm from Blood Updated:  10/03/16 1221    Narrative:       ORDER#: [id number]                                    ORDERED BY: POTLURI, JAGADI  SOURCE: Blood RAC                                    COLLECTED:  10/01/16 09:49  ANTIBIOTICS AT COLL.:                                RECEIVED :  10/01/16 12:12  Culture Blood Aerobic and Anaerobic        PRELIM      10/03/16 12:21  10/02/16   No Growth after 1 day/s of incubation.  10/03/16   No Growth after 2 day/s of incubation.      Glucose Whole Blood - POCT [[id number]]  (Abnormal) Collected:  10/03/16 1127     Updated:  10/03/16 1132     POCT - Glucose Whole blood 344 (H) mg/dL     CULTURE + Dierdre Forth [[id number]] Collected:  10/01/16 2110    Specimen:  Sputum from Sputum, Expectorated Updated:  10/03/16 1118    Narrative:       ORDER#: [id number]                                    ORDERED BY: Sharen Counter  SOURCE: Sputum, Expectorated lungs  COLLECTED:  10/01/16 21:10  ANTIBIOTICS AT COLL.:                                RECEIVED :  10/02/16 02:41  Stain, Gram (Respiratory)                  FINAL       10/02/16 03:46  10/02/16   Few WBC's             No Epithelial cells             Few Mixed Respiratory Flora  Culture and Gram Stain, Aerobic, RespiratorPRELIM      10/03/16 11:18  10/03/16   Moderate growth of mixed upper respiratory flora             Further report to follow      Throat Culture [[id number]] Collected:  10/01/16 2110    Specimen:  Throat Updated:  10/03/16 1117    Narrative:       ORDER#: [id number]                                    ORDERED BY: Faythe Casa, [place]  SOURCE: Throat                                       COLLECTED:  10/01/16 21:10  ANTIBIOTICS AT COLL.:                                RECEIVED :  10/02/16 02:43  Urinalysis, with Microscopic if indicated was  cancelled on 10/01/16 at 19:21 by HIS; RBS rule  Throat Culture                             PRELIM      10/03/16 11:17  10/03/16   Culture in progress, final to follow      Cell MorpHology [[id number]]  (Abnormal) Collected:  10/03/16 0735     Updated:  10/03/16 0931     Cell Morphology: Abnormal (A)     Platelet Estimate Normal     Dual RBC population Present (A)     Platelet Clumps Present (A)    Manual Differential [[id number]]  (Abnormal) Collected:  10/03/16 0735     Updated:  10/03/16 0931     Segmented Neutrophils 83 %      Band Neutrophils 3 %      Lymphocytes Manual 14 %      Monocytes Manual 0 %      Eosinophils Manual 0 %      Basophils Manual 0 %      Abs Seg Manual 13.08 (H) x10 3/uL      Bands Absolute 0.47 x10 3/uL      Absolute Lymph Manual 2.21 x10 3/uL      Monocytes Absolute 0.00 x10 3/uL      Absolute Eos Manual 0.00 x10 3/uL      Absolute Baso Manual 0.00 x10 3/uL     CBC and differential [[id number]]  (Abnormal) Collected:  10/03/16 0735     Updated:  10/03/16 0931     WBC 15.76 (H) x10  3/uL      Hgb 13.8 g/dL      Hematocrit 62.9 (L) %      Platelets 264 x10 3/uL      RBC 4.32 (L) x10 6/uL      MCV 93.5 fL      MCH 31.9 pg      MCHC 34.2 g/dL      RDW 13 %      MPV 12.3 fL      Nucleated RBC 0.0 /100 WBC      Absolute NRBC 0.00 x10 3/uL     Glucose Whole Blood - POCT [[id number]]  (Abnormal) Collected:  10/03/16 0731     Updated:  10/03/16 0738     POCT - Glucose Whole blood 336 (H) mg/dL     GFR [[id number]] Collected:  10/03/16 0607     Updated:  10/03/16 0643     EGFR >60.0    Basic Metabolic Panel [[id number]]  (Abnormal) Collected:  10/03/16 0607    Specimen:  Blood Updated:  10/03/16 0643     Glucose 343 (H) mg/dL      BUN 59.5 (H) mg/dL      Creatinine 1.4 (H) mg/dL      Calcium 8.9 mg/dL      Sodium 638 (L) mEq/L      Potassium 5.6 (H) mEq/L      Chloride 106 mEq/L      CO2 19 (L) mEq/L      Anion Gap 9.0    Magnesium [[id number]] Collected:  10/03/16 0607    Specimen:  Blood Updated:   10/03/16 0643     Magnesium 2.2 mg/dL     Glucose Whole Blood - POCT [[id number]]  (Abnormal) Collected:  10/02/16 2056     Updated:  10/02/16 2149     POCT - Glucose Whole blood 402 (H) mg/dL     Glucose Whole Blood - POCT [[id number]]  (Abnormal) Collected:  10/02/16 1656     Updated:  10/02/16 1713     POCT - Glucose Whole blood 258 (H) mg/dL           Radiology:     Radiology Results (24 Hour)     ** No results found for the last 24 hours. **            Signed by: Arthur Holms, MD  Date/Time: 10/03/2016 3:53 PM

## 2016-10-04 ENCOUNTER — Inpatient Hospital Stay: Payer: Commercial Managed Care - POS

## 2016-10-04 LAB — COMPREHENSIVE METABOLIC PANEL
ALT: 9 U/L (ref 0–55)
AST (SGOT): 8 U/L (ref 5–34)
Albumin/Globulin Ratio: 0.9 (ref 0.9–2.2)
Albumin: 3.2 g/dL — ABNORMAL LOW (ref 3.5–5.0)
Alkaline Phosphatase: 81 U/L (ref 38–106)
Anion Gap: 8 (ref 5.0–15.0)
BUN: 27.8 mg/dL (ref 9.0–28.0)
Bilirubin, Total: 0.4 mg/dL (ref 0.2–1.2)
CO2: 24 mEq/L (ref 22–29)
Calcium: 8.8 mg/dL (ref 8.5–10.5)
Chloride: 105 mEq/L (ref 100–111)
Creatinine: 1.3 mg/dL (ref 0.7–1.3)
Globulin: 3.4 g/dL (ref 2.0–3.6)
Glucose: 283 mg/dL — ABNORMAL HIGH (ref 70–100)
Potassium: 5 mEq/L (ref 3.5–5.1)
Protein, Total: 6.6 g/dL (ref 6.0–8.3)
Sodium: 137 mEq/L (ref 136–145)

## 2016-10-04 LAB — CBC
Absolute NRBC: 0 10*3/uL
Hematocrit: 36.2 % — ABNORMAL LOW (ref 42.0–52.0)
Hgb: 13 g/dL (ref 13.0–17.0)
MCH: 35.3 pg — ABNORMAL HIGH (ref 28.0–32.0)
MCHC: 35.9 g/dL (ref 32.0–36.0)
MCV: 98.4 fL (ref 80.0–100.0)
MPV: 12.3 fL (ref 9.4–12.3)
Nucleated RBC: 0 /100 WBC (ref 0.0–1.0)
Platelets: 266 10*3/uL (ref 140–400)
RBC: 3.68 10*6/uL — ABNORMAL LOW (ref 4.70–6.00)
RDW: 14 % (ref 12–15)
WBC: 15.12 10*3/uL — ABNORMAL HIGH (ref 3.50–10.80)

## 2016-10-04 LAB — CELL MORPHOLOGY
Cell Morphology: ABNORMAL — AB
Platelet Estimate: NORMAL

## 2016-10-04 LAB — GLUCOSE WHOLE BLOOD - POCT
Whole Blood Glucose POCT: 245 mg/dL — ABNORMAL HIGH (ref 70–100)
Whole Blood Glucose POCT: 310 mg/dL — ABNORMAL HIGH (ref 70–100)
Whole Blood Glucose POCT: 303 mg/dL — ABNORMAL HIGH (ref 70–100)
Whole Blood Glucose POCT: 378 mg/dL — ABNORMAL HIGH (ref 70–100)

## 2016-10-04 LAB — M. PNEUMONIAE AB IGM, EIA: M. pneumoniae Ab IgM, EIA: 306 (ref <770)

## 2016-10-04 LAB — GFR: EGFR: >60

## 2016-10-04 LAB — LACTIC ACID, PLASMA: Lactic Acid: 2 mmol/L (ref 0.2–2.0)

## 2016-10-04 MED ORDER — INSULIN LISPRO 100 UNIT/ML SC SOLN
5.0000 [IU] | Freq: Three times a day (TID) | SUBCUTANEOUS | Status: DC
Start: 2016-10-04 — End: 2016-10-05
  Administered 2016-10-04: 5 [IU] via SUBCUTANEOUS
  Filled 2016-10-04: qty 15

## 2016-10-04 MED ORDER — FUROSEMIDE 10 MG/ML IJ SOLN
20.0000 mg | Freq: Every day | INTRAMUSCULAR | Status: DC
Start: 2016-10-04 — End: 2016-10-05
  Administered 2016-10-04 – 2016-10-05 (×2): 20 mg via INTRAVENOUS
  Filled 2016-10-04 (×2): qty 2

## 2016-10-04 NOTE — Progress Notes (Signed)
[name]  Progress Note  Patient Info:   Date/Time: 10/04/2016 / 3:52 PM   Admit Date:10/01/2016  Patient Name:[name] [name]   ZOX:[id number]   PCP: Zorita Pang, MD  Attending Physician:Jazmine Longshore, Betsey Holiday, MD     Subjective:   10/04/16     Patient thinks breathing better. Exertional shortness of breath on ambulating as per nurse. Cough and hoarseness voice better. No diarrhea.     Chief Complaint:  Chest Pain and Shortness of Breath  Update of Review of Systems:  Review of Systems   Constitutional: Negative for chills and fever.   HENT:        Hoarseness of voice   Respiratory: Positive for cough and shortness of breath. Negative for wheezing.    Cardiovascular: Negative for chest pain and leg swelling.   Gastrointestinal: Negative for abdominal pain, nausea and vomiting.   Genitourinary: Negative for dysuria.   Neurological: Negative for dizziness, sensory change and focal weakness.     Assessment and Plan:   1. Bronchitis with community-acquired pneumonia with mild respiratory insufficiency  2 L nasal cannula.  No fever. WBC remain elevated, likely steroid induced.  --- Continue antibiotic with Rocephin and azithromycin, probiotics  --- IV Solu-Medrol.  Reduce dose to 40 mg bid. Patient is better and blood sugar remained high.   --- Influenza test negative.  Sputum culture showed a few mixed respiratory flora, urine for Legionella antigen negative mycoplasma titer pending  --- Pulmonary recommending outpatient pulmonary function tests to evaluate for asthma  --- Flonase nasal spray, Zyrtec, add Cepacol lozenges with Robitussin DM and mucinex for cough, Hycodan  --- Elevated  pro-calcitonin 4.5  --- Negative troponins.  BNP only slightly elevated at 195  ---EKG showed T wave inversion on V5, appear chronic  --- Chest x-ray on September 30, 2016 showed upper lobe vascular prominence with peribronchial cuffing.  Asymmetry hazy right lower lung as type obesity may represent pneumonia.  Repeat  chest x-ray on October 01, 2016 showed no acute pulmonary cardio process.  Repeat chest x-ray today showed new borderline cardiomegaly. Mildly improved bilateral Insterstitial /aorspace disease which may represent edema or infiltrate.     2.  Hoarseness of voice.  Patient and wife thinks it is his baseline. May have component of laryngitis with bronchitis as well. Sounds better than admission. No inspiratory stridor.   --- Asked patient and wife to follow up with ENT to rule out any vocal cord or laryngeal pathology    3.  Acute kidney injury.  Creatinine of 1.3 from 1.4.   Patient baseline creatinine is 0.9  --We will monitor    4.  Hyperkalemia of 5.6.  Mild metabolic acidosis of bicarbonate of 19. Resolved.   ---  Will restart Spironolactone tomorrow.   --- Monitor.     5.  Elevated lactic acid level, likely due to dehydration giving acute kidney injury. Normalized.   --- Blood culture update negative  --- Negative urinalysis  ---Encourage by mouth fluid.  Will be cautious with IV fluids    6.  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  Mild to moderate mitral regurgitation.  Grade 2 diastolic dysfunction.  Mild pulmonary hypertension.  --- negative troponins  --On Coreg, spironolectone  --- Now have rales. Will start lasix 20 mg daily and monitor renal function    7.  History of diabetes mellitus.  Uncontrolled as patient is on steroid    ---continue Januvia 50 mg with Lantus 50 units evening.  High scale.  Sliding scale insulin. Add bolus insulin Novolog 5 units with meals.  --- 1800 ADA diet and Accu-Chek.     8.  History of myocardial infarction in 2011.  Patient currently on aspirin, statin and beta blocker    9.  History of hypertension.  Blood pressure stays between 140-150  --- Continue normal max Coreg    Disposition continue steroids, antibiotics.  Monitor  renal function  Finding and plan discussed with patient .       DVT Prohylaxis:lovenox  Central Line/Foley Catheter/PICC line status: none  Code Status: Full Code  Disposition:home  Type of Admission:Inpatient  Anticipated Length of Stay:  > 2 midnights  Medical Necessity for stay: Pneumonia with bronchitis  Hospital Problems:   Active Problems:    Dyspnea, unspecified type    Objective:     Vitals:    10/04/16 0731 10/04/16 0801 10/04/16 0851 10/04/16 1310   BP: 130/88  120/78 115/78   Pulse:  91 79 87   Resp: 16 18 16 19    Temp:   97.9 F (36.6 C) 97.9 F (36.6 C)   TempSrc:       SpO2:  95% 94% 93%   Weight:       Height:         Physical Exam:   Physical Exam   Constitutional: He is oriented to person, place, and time.   HENT:   Mouth/Throat: No oropharyngeal exudate.   Hoarseness of voice, direct throat exam no sign of acute inflammation   Cardiovascular: Normal rate and regular rhythm.    No murmur heard.  Pulmonary/Chest: Effort normal. No respiratory distress. He has no wheezes. He has rales.   Abdominal: Soft. Bowel sounds are normal. He exhibits no distension. There is no tenderness. There is no rebound and no guarding.   Musculoskeletal: He exhibits no edema or tenderness.   Neurological: He is alert and oriented to person, place, and time. No cranial nerve deficit. Coordination normal. GCS score is 15.   Skin: Skin is warm.   Psychiatric: Memory, affect and judgment normal.     Results of Labs/imaging   Labs and radiology reports have been reviewed.    Hospitalist   Signed by:   Clinton Dar Harmani Neto  10/04/2016 3:52 PM    *This note was generated by the Epic EMR system/ Dragon speech recognition and may contain inherent errors or omissions not intended by the user. Grammatical errors, random word insertions, deletions, pronoun errors and incomplete sentences are occasional consequences of this technology due to software limitations. Not all errors are caught or corrected. If there are questions or  concerns about the content of this note or information contained within the body of this dictation they should be addressed directly with the author for clarification

## 2016-10-04 NOTE — Plan of Care (Signed)
Problem: Safety  Goal: Patient will be free from injury during hospitalization   10/03/16 0119   Goal/Interventions addressed this shift   Patient will be free from injury during hospitalization  Assess patient's risk for falls and implement fall prevention plan of care per policy;Provide and maintain safe environment;Use appropriate transfer methods;Ensure appropriate safety devices are available at the bedside;Include patient/ family/ care giver in decisions related to safety;Hourly rounding

## 2016-10-04 NOTE — Progress Notes (Signed)
Tried weaning off O2    -At rest with O2 94%    -walking without O2 91%    -walking with O2 93%  I put the pt back on 2L O2 NC due to worsening SOB and wheezing.

## 2016-10-04 NOTE — Plan of Care (Signed)
Problem: Safety  Goal: Patient will be free from infection during hospitalization  Outcome: Progressing   10/04/16 0128   Goal/Interventions addressed this shift   Free from Infection during hospitalization Assess and monitor for signs and symptoms of infection;Monitor lab/diagnostic results       Problem: Inadequate Gas Exchange  Goal: Adequate oxygenation and improved ventilation  Outcome: Progressing   10/03/16 0848   Goal/Interventions addressed this shift   Adequate oxygenation and improved ventilation Assess lung sounds;Provide mechanical and oxygen support to facilitate gas exchange;Monitor SpO2 and treat as needed;Plan activities to conserve energy: plan rest periods;Position for maximum ventilatory efficiency

## 2016-10-04 NOTE — Plan of Care (Signed)
Problem: Safety  Goal: Patient will be free from injury during hospitalization  Outcome: Progressing   10/03/16 0119   Goal/Interventions addressed this shift   Patient will be free from injury during hospitalization  Assess patient's risk for falls and implement fall prevention plan of care per policy;Provide and maintain safe environment;Use appropriate transfer methods;Ensure appropriate safety devices are available at the bedside;Include patient/ family/ care giver in decisions related to safety;Hourly rounding     Call bell and personal belongings within reach, bed alarm on. Pt needs assist, hourly rounding done    Problem: Pain  Goal: Pain at adequate level as identified by patient  Outcome: Progressing   10/03/16 0119   Goal/Interventions addressed this shift   Pain at adequate level as identified by patient Identify patient comfort function goal;Assess for risk of opioid induced respiratory depression, including snoring/sleep apnea. Alert healthcare team of risk factors identified.;Assess pain on admission, during daily assessment and/or before any "as needed" intervention(s);Reassess pain within 30-60 minutes of any procedure/intervention, per Pain Assessment, Intervention, Reassessment (AIR) Cycle;Evaluate if patient comfort function goal is met;Evaluate patient's satisfaction with pain management progress     Pt denies pain at this time, will continue to monitor.    Problem: Moderate/High Fall Risk Score >5  Goal: Patient will remain free of falls  Outcome: Progressing   10/04/16 0800   OTHER   Moderate Risk (6-13) MOD-([facility] Only) Yellow "Fall Risk" signage     Nonskid socks on, bed at lowest level and locked. Bed alarm on. Pt needs assist, hourly rounding done

## 2016-10-04 NOTE — Progress Notes (Signed)
Pulmonary Progress Note  "[facility]"    Date Time: 10/04/16 1:56 PM  Patient Name: [name] [name]  Attending Physician: Sunny Schlein, MD      Assessment:   -Suspect  CAP.  -Bronchospasm and  wheezing and dyspnea related to above. 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 No known h/o lung disease. Has no wheezing today.   -Hypoxia: on 2 LPM today. . 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  Patient Active Problem List   Diagnosis   . Chest pain   . Diabetes mellitus   . Heart attack   . CAD (coronary artery disease)   . Non-ischemic cardiomyopathy   . Acute exacerbation of CHF (congestive heart failure)   . PUD (peptic ulcer disease)   . Elevated LFTs   . Acute dyspnea   . Elevated d-dimer   . CHF (congestive heart failure)   . Headaches due to old head injury   . Essential hypertension   . Pneumonia due to infectious organism, unspecified laterality, unspecified part of lung   . Syncope and collapse   . AICD (automatic cardioverter/defibrillator) present   . Syncope, unspecified syncope type   . Pneumonia due to infectious organism   . Dyspnea, unspecified type       Plan:   Taper steroid starting tomorrow  Diuretics  Keep net negative  Interval History   Interval History/24 hr. Events:stays  less SOB, still has cough but less.   I spoke with the nursing staff about his condition. Per RN he has increase WOB with activity but sat remains at 92%    Physical Exam:   Vital signs for last 24 hours:  Temp:  [97.3 F (36.3 C)-97.9 F (36.6 C)] 97.9 F (36.6 C)  Heart Rate:  [64-99] 87  Resp Rate:  [16-20] 19  BP: (115-148)/(75-88) 115/78    Intake and Output Summary (Last 24 hours) at Date Time    Intake/Output Summary (Last 24 hours) at 10/04/16 1356  Last data  filed at 10/04/16 0220   Gross per 24 hour   Intake              900 ml   Output                0 ml   Net              900 ml       General: awake, alert, oriented x 3; no acute distress;  HEENT: PERR; no cervical LN, no thrush.  Cardiovascular: regular rate and rhythm, no murmurs, rubs or gallops.  Lungs: Has unlabored breathing. Reduced air entry, no wheezing today, has crackles in bases  Abdomen: soft, non-tender, non-distended;  no hepatosplenomegaly,   Extremities:trace edema.  Neuro: Normal sensory and motor systems.       Meds:   Scheduled Meds:  Current Facility-Administered Medications   Medication Dose Route Frequency   . albuterol-ipratropium  3 mL Nebulization Q4H WA   . amLODIPine-valsartan (EXFORGE) combination tablet 10-320 mg   Oral Daily   . aspirin  81 mg Oral Daily   . azithromycin  500 mg Intravenous Q24H SCH   . carvedilol  25 mg Oral Q12H SCH   . cefTRIAXone  1 g Intravenous Q24H SCH   .  enoxaparin  40 mg Subcutaneous Daily   . famotidine  20 mg Oral Q12H SCH   . guaiFENesin  600 mg Oral Q12H SCH   . HYDROcodone-homatropine  5 mL Oral 4 times per day   . insulin glargine  50 Units Subcutaneous QHS   . lactobacillus/streptococcus  1 capsule Oral Daily   . methylPREDNISolone  40 mg Intravenous BID   . niacin  500 mg Oral QHS   . SITagliptin  50 mg Oral Daily     Continuous Infusions:   PRN Meds:.albuterol-ipratropium, benzocaine-menthol, Nursing communication: Adult Hypoglycemia Treatment Algorithm **AND** dextrose **AND** dextrose **AND** glucagon (rDNA), insulin lispro, insulin lispro, naloxone, zolpidem      Labs:     Results     Procedure Component Value Units Date/Time    Blood Culture Aerobic/Anaerobic #2 [[id number]] Collected:  10/01/16 0949    Specimen:  Arm from Blood Updated:  10/04/16 1221    Narrative:       ORDER#: [id number]                                    ORDERED BY: POTLURI, JAGADI  SOURCE: Blood RAC                                    COLLECTED:  10/01/16 09:49  ANTIBIOTICS AT  COLL.:                                RECEIVED :  10/01/16 12:12  Culture Blood Aerobic and Anaerobic        PRELIM      10/04/16 12:21  10/02/16   No Growth after 1 day/s of incubation.  10/03/16   No Growth after 2 day/s of incubation.  10/04/16   No Growth after 3 day/s of incubation.      Blood Culture Aerobic/Anaerobic #1 [[id number]] Collected:  10/01/16 0958    Specimen:  Arm from Blood Updated:  10/04/16 1221    Narrative:       ORDER#: [id number]                                    ORDERED BY: POTLURI, JAGADI  SOURCE: Blood LFA                                    COLLECTED:  10/01/16 09:58  ANTIBIOTICS AT COLL.:                                RECEIVED :  10/01/16 12:12  Culture Blood Aerobic and Anaerobic        PRELIM      10/04/16 12:21  10/02/16   No Growth after 1 day/s of incubation.  10/03/16   No Growth after 2 day/s of incubation.  10/04/16   No Growth after 3 day/s of incubation.      Glucose Whole Blood - POCT [[id number]]  (Abnormal) Collected:  10/04/16 1112     Updated:  10/04/16 1142     POCT - Glucose Whole blood 310 (H) mg/dL  Throat Culture [[id number]] Collected:  10/01/16 2110    Specimen:  Throat Updated:  10/04/16 1046    Narrative:       ORDER#: [id number]                                    ORDERED BY: Faythe Casa, [place]  SOURCE: Throat                                       COLLECTED:  10/01/16 21:10  ANTIBIOTICS AT COLL.:                                RECEIVED :  10/02/16 02:43  Urinalysis, with Microscopic if indicated was cancelled on 10/01/16 at 19:21 by HIS; RBS rule  Throat Culture                             FINAL       10/04/16 10:46  10/04/16   No Beta Hemolytic Streptococcus Group A, C, or G isolated,             no further work.      CULTURE + Dierdre Forth [[id number]] Collected:  10/01/16 2110    Specimen:  Sputum from Sputum, Expectorated Updated:  10/04/16 1038    Narrative:       ORDER#: [id number]                                    ORDERED BY: Sharen Counter  SOURCE: Sputum, Expectorated lungs                   COLLECTED:  10/01/16 21:10  ANTIBIOTICS AT COLL.:                                RECEIVED :  10/02/16 02:41  Stain, Gram (Respiratory)                  FINAL       10/02/16 03:46  10/02/16   Few WBC's             No Epithelial cells             Few Mixed Respiratory Flora  Culture and Gram Stain, Aerobic, RespiratorFINAL       10/04/16 10:38  10/03/16   Moderate growth of mixed upper respiratory flora      Glucose Whole Blood - POCT [[id number]]  (Abnormal) Collected:  10/04/16 0714     Updated:  10/04/16 0744     POCT - Glucose Whole blood 245 (H) mg/dL     Cell MorpHology [[id number]]  (Abnormal) Collected:  10/04/16 0553     Updated:  10/04/16 6387     Cell Morphology: Abnormal (A)     Platelet Estimate Normal     Dual RBC population Present (A)     Platelet Clumps Present (A)    CBC without differential [[id number]]  (Abnormal) Collected:  10/04/16 0553    Specimen:  Blood from Blood Updated:  10/04/16 0721     WBC 15.12 (  H) x10 3/uL      Hgb 13.0 g/dL      Hematocrit 16.1 (L) %      Platelets 266 x10 3/uL      RBC 3.68 (L) x10 6/uL      MCV 98.4 fL      MCH 35.3 (H) pg      MCHC 35.9 g/dL      RDW 14 %      MPV 12.3 fL      Nucleated RBC 0.0 /100 WBC      Absolute NRBC 0.00 x10 3/uL     Comprehensive metabolic panel [[id number]]  (Abnormal) Collected:  10/04/16 0553    Specimen:  Blood Updated:  10/04/16 0641     Glucose 283 (H) mg/dL      BUN 81.1 mg/dL      Creatinine 1.3 mg/dL      Sodium 914 mEq/L      Potassium 5.0 mEq/L      Chloride 105 mEq/L      CO2 24 mEq/L      Calcium 8.8 mg/dL      Protein, Total 6.6 g/dL      Albumin 3.2 (L) g/dL      AST (SGOT) 8 U/L      ALT 9 U/L      Alkaline Phosphatase 81 U/L      Bilirubin, Total 0.4 mg/dL      Globulin 3.4 g/dL      Albumin/Globulin Ratio 0.9     Anion Gap 8.0    GFR [[id number]] Collected:  10/04/16 0553     Updated:  10/04/16 0641     EGFR >60.0    Lactic acid, plasma [[id number]] Collected:  10/04/16  0553    Specimen:  Blood Updated:  10/04/16 0623     Lactic acid 2.0 mmol/L     Glucose Whole Blood - POCT [[id number]]  (Abnormal) Collected:  10/03/16 2112     Updated:  10/03/16 2126     POCT - Glucose Whole blood 342 (H) mg/dL     Potassium [[id number]]  (Abnormal) Collected:  10/03/16 1910    Specimen:  Blood Updated:  10/03/16 1933     Potassium 5.4 (H) mEq/L     Glucose Whole Blood - POCT [[id number]]  (Abnormal) Collected:  10/03/16 1640     Updated:  10/03/16 1932     POCT - Glucose Whole blood 413 (H) mg/dL           Radiology:     Radiology Results (24 Hour)     Procedure Component Value Units Date/Time    XR Chest 2 Views [[id number]] Collected:  10/04/16 1103    Order Status:  Completed Updated:  10/04/16 1110    Narrative:       Clinical History: Follow-up.    Findings: PA and lateral views of the chest. Compared with prior studies  including 10/01/2016.     Calcification and mild tortuosity of the thoracic aorta. Single lead  pacemaker wire lies over the cavoatrial junction region. Cardiac  silhouette is borderline enlarged. Cardiac silhouette appears larger  than previously. Patchy bilateral interstitial/airspace disease with  perihilar and basilar predominance has mildly improved.      Impression:         1. New borderline cardiomegaly.  2. Mildly improved bilateral interstitial/airspace disease which may  represent edema or infiltrate.    Darra Lis, MD   10/04/2016 11:06 AM    PULMONARY FUNCTION TESTS  SCAN [[id number]] Resulted:  10/03/16 1737    Order Status:  Completed Updated:  10/03/16 1737            Signed by: Arthur Holms, MD  Date/Time: 10/04/2016 1:56 PM

## 2016-10-05 LAB — CBC
Absolute NRBC: 0 10*3/uL
Hematocrit: 39.3 % — ABNORMAL LOW (ref 42.0–52.0)
Hgb: 13.7 g/dL (ref 13.0–17.0)
MCH: 34.5 pg — ABNORMAL HIGH (ref 28.0–32.0)
MCHC: 34.9 g/dL (ref 32.0–36.0)
MCV: 99 fL (ref 80.0–100.0)
MPV: 12.2 fL (ref 9.4–12.3)
Nucleated RBC: 0 /100 WBC (ref 0.0–1.0)
Platelets: 320 10*3/uL (ref 140–400)
RBC: 3.97 10*6/uL — ABNORMAL LOW (ref 4.70–6.00)
RDW: 15 % (ref 12–15)
WBC: 14.99 10*3/uL — ABNORMAL HIGH (ref 3.50–10.80)

## 2016-10-05 LAB — BASIC METABOLIC PANEL
Anion Gap: 9 (ref 5.0–15.0)
BUN: 26.1 mg/dL (ref 9.0–28.0)
CO2: 27 mEq/L (ref 22–29)
Calcium: 9.5 mg/dL (ref 8.5–10.5)
Chloride: 102 mEq/L (ref 100–111)
Creatinine: 1.2 mg/dL (ref 0.7–1.3)
Glucose: 135 mg/dL — ABNORMAL HIGH (ref 70–100)
Potassium: 5 mEq/L (ref 3.5–5.1)
Sodium: 138 mEq/L (ref 136–145)

## 2016-10-05 LAB — CELL MORPHOLOGY
Cell Morphology: ABNORMAL — AB
Platelet Estimate: NORMAL

## 2016-10-05 LAB — GFR: EGFR: >60

## 2016-10-05 LAB — GLUCOSE WHOLE BLOOD - POCT
Whole Blood Glucose POCT: 275 mg/dL — ABNORMAL HIGH (ref 70–100)
Whole Blood Glucose POCT: 260 mg/dL — ABNORMAL HIGH (ref 70–100)
Whole Blood Glucose POCT: 154 mg/dL — ABNORMAL HIGH (ref 70–100)

## 2016-10-05 LAB — MAGNESIUM: Magnesium: 2.2 mg/dL (ref 1.6–2.6)

## 2016-10-05 MED ORDER — GUAIFENESIN ER 600 MG PO TB12
600.0000 mg | ORAL_TABLET | Freq: Two times a day (BID) | ORAL | 0 refills | Status: DC
Start: 2016-10-05 — End: 2016-12-14

## 2016-10-05 MED ORDER — PREDNISONE 10 MG PO TABS
ORAL_TABLET | ORAL | 0 refills | Status: DC
Start: 2016-10-05 — End: 2016-11-09

## 2016-10-05 MED ORDER — LANTUS SOLOSTAR 100 UNIT/ML SC SOPN
PEN_INJECTOR | SUBCUTANEOUS | 3 refills | Status: DC
Start: 2016-10-05 — End: 2016-12-14

## 2016-10-05 MED ORDER — PREDNISONE 20 MG PO TABS
20.0000 mg | ORAL_TABLET | Freq: Every day | ORAL | Status: DC
Start: 2016-10-05 — End: 2016-10-05
  Administered 2016-10-05: 20 mg via ORAL
  Filled 2016-10-05: qty 1

## 2016-10-05 MED ORDER — HYDROCODONE-HOMATROPINE 5-1.5 MG/5ML PO SYRP
5.0000 mL | ORAL_SOLUTION | ORAL | Status: DC | PRN
Start: 2016-10-05 — End: 2016-10-05

## 2016-10-05 MED ORDER — PREDNISONE 10 MG PO TABS
10.0000 mg | ORAL_TABLET | Freq: Every day | ORAL | Status: DC
Start: 2016-10-11 — End: 2016-10-05

## 2016-10-05 MED ORDER — INSULIN LISPRO 100 UNIT/ML SC SOLN
8.0000 [IU] | Freq: Three times a day (TID) | SUBCUTANEOUS | Status: DC
Start: 2016-10-05 — End: 2016-10-05
  Administered 2016-10-05 (×3): 8 [IU] via SUBCUTANEOUS
  Filled 2016-10-05 (×3): qty 24

## 2016-10-05 MED ORDER — PREDNISONE 5 MG PO TABS
15.0000 mg | ORAL_TABLET | Freq: Every day | ORAL | Status: DC
Start: 2016-10-08 — End: 2016-10-05

## 2016-10-05 MED ORDER — RISAQUAD PO CAPS
1.0000 | ORAL_CAPSULE | Freq: Every day | ORAL | 0 refills | Status: AC
Start: 2016-10-06 — End: 2016-11-05

## 2016-10-05 MED ORDER — FUROSEMIDE 20 MG PO TABS
20.0000 mg | ORAL_TABLET | Freq: Every day | ORAL | 0 refills | Status: DC
Start: 2016-10-05 — End: 2016-11-09

## 2016-10-05 MED ORDER — ALBUTEROL SULFATE HFA 108 (90 BASE) MCG/ACT IN AERS
2.0000 | INHALATION_SPRAY | RESPIRATORY_TRACT | 0 refills | Status: DC | PRN
Start: 2016-10-05 — End: 2016-12-02

## 2016-10-05 MED ORDER — FLUTICASONE FUROATE-VILANTEROL 100-25 MCG/INH IN AEPB
1.0000 | INHALATION_SPRAY | Freq: Every morning | RESPIRATORY_TRACT | Status: DC
Start: 2016-10-05 — End: 2016-10-05
  Filled 2016-10-05: qty 14

## 2016-10-05 MED ORDER — CEFUROXIME AXETIL 500 MG PO TABS
500.0000 mg | ORAL_TABLET | Freq: Two times a day (BID) | ORAL | 0 refills | Status: AC
Start: 2016-10-05 — End: 2016-10-10

## 2016-10-05 MED ORDER — PREDNISONE 5 MG PO TABS
5.0000 mg | ORAL_TABLET | Freq: Every day | ORAL | Status: DC
Start: 2016-10-14 — End: 2016-10-05

## 2016-10-05 NOTE — UM Notes (Signed)
[place]    REF# [id number]  Hospital course overview for 10/03/16 - 10/04/16  C/b to [name] [phone]    Thank-you!            10/03/16 : Tried wean off O2, Sat ok but SOB and wheezing worsens, put him back on 2L O2 NC.   10/03/16 : per PULM : taper Solu Medrol to 40mg  BID. Wean O2 as tol to keep sats > 90%.  10/03/16 : K 5.6, Co2 19 - give Kayexelate, stop Spironolactone per MD.          10/04/16 : Less SOB, still has cough but less. Continues to have increased WOB and SOB with activity when tried off O2.  10/04/16 : CXRY today showed new borderline cardiomegaly. Mildly improved bilateral Insterstitial /aorspace disease which may represent edema or infiltrate. Also, now with rales on exam - per MD, start Lasix 20mg  IV daily and monitor renal function.  10/04/16 : 98.2, HR 97, RR to 22, BP 136/87, sats 93% RA - to 95% O2 2L NC  10/04/16 : Cr 1.4 from 1.3, K 5.0, WBC 15.12, Glucose 283      MD assessment/plan 10/04/16 :  1. Bronchitis with community-acquired pneumonia with mild respiratory insufficiency 2 L nasal cannula. No fever. WBC remain elevated, likely steroid induced.  ---Continue antibiotic with Rocephin and azithromycin, probiotics  ---IV Solu-Medrol.  Reduce dose to 40 mg bid. Patient is better and blood sugar remained high.   ---Influenza test negative. Sputum culture showed a few mixed respiratory flora, urine for Legionella antigen negative mycoplasma titer pending  ---Pulmonary recommending outpatient pulmonary function tests to evaluate for asthma  ---Flonase nasal spray, Zyrtec, add Cepacol lozenges with Robitussin DM and mucinex for cough, Hycodan  ---Elevated  pro-calcitonin 4.5  ---Negative troponins. BNP only slightly elevated at 195  ---EKG showed T wave inversion on V5, appear chronic  ---Chest x-ray on September 30, 2016 showed upper lobe vascular prominence with peribronchial cuffing. Asymmetry hazy right lower lung as type obesity may represent pneumonia. Repeat chest  x-ray on October 01, 2016 showed no acute pulmonary cardio process.  Repeat chest x-ray today showed new borderline cardiomegaly. Mildly improved bilateral Insterstitial /aorspace disease which may represent edema or infiltrate.   2.  Hoarseness of voice.  Patient and wife thinks it is his baseline. May have component of laryngitis with bronchitis as well. Sounds better than admission. No inspiratory stridor.   --- Asked patient and wife to follow up with ENT to rule out any vocal cord or laryngeal pathology  3. Acute kidney injury. Creatinine of 1.3 from 1.4.  Patient baseline creatinine is 0.9  --We will monitor  4.  Hyperkalemia of 5.6.  Mild metabolic acidosis of bicarbonate of 19. Resolved.   ---  Will restart Spironolactone tomorrow.   --- Monitor.   5. Elevated lactic acid level, likely due to dehydration giving acute kidney injury. Normalized.   ---Blood culture update negative  ---Negative urinalysis  ---Encourage by mouth fluid. Will be cautious with IV fluids  6. 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 Mild to moderate mitral regurgitation. 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  ---negative troponins  --On Coreg, spironolectone  --- Now have rales. Will start lasix 20 mg daily and monitor renal function  7. History of diabetes mellitus.  Uncontrolled as patient is on steroid   ---continue Januvia 50 mg with  Lantus 50 units evening.  High scale.  Sliding scale insulin. Add bolus insulin Novolog 5 units with meals.  ---1800 ADA diet and Accu-Chek.   8. History of myocardial infarction in 2011. Patient currently on aspirin, statin and beta blocker  9. History of hypertension. Blood pressure stays between 140-150  ---Continue normal max Coreg          ORDERS : tele, PULM, DuoNeb q4, po Amlodipine, ASA, 500mg  IV Zithromax q24,  1g IV Rocephin q24, SQ Lovenox 40mg  daily, po Pepcid, Breo IH, 20mg  IV Lasix daily, po Mucinex, SC Lantus qHS, po Risaquad, Niacin, Prednisone, Januvia, prn Cepacol, prn Hydrocodone, SS Insulin, prn IV Narcan, supp O2, diet as tol, I/O, VS q4

## 2016-10-05 NOTE — Progress Notes (Signed)
Pulmonary Progress Note   "[facility]"    Date Time: 10/05/16 7:42 AM  Patient Name: [name] [name]  Attending Physician: Sunny Schlein, MD      Assessment:     Patient Active Problem List   Diagnosis   . Diabetes mellitus   . CAD (coronary artery disease)   . Non-ischemic cardiomyopathy   . Pneumonia due to infectious organism: patient is slowly improving but able to [facility] without difficulty. No bronchospasm evident on exam. Will convert Solumedrol to prednisone and begin Breo. Will check exercise pulse oximetry to evaluate home oxygen needs. The patient was instructed to f/u in our office in 1-2 weeks.             Plan:   See above      Interval History   Interval History/24 hr. Events: feels better except for persistent dry cough    Physical Exam:   Vital signs for last 24 hours:  Temp:  [97.2 F (36.2 C)-98.2 F (36.8 C)] 97.2 F (36.2 C)  Heart Rate:  [73-97] 83  Resp Rate:  [16-22] 22  BP: (115-136)/(77-87) 117/77    Intake and Output Summary (Last 24 hours) at Date Time    Intake/Output Summary (Last 24 hours) at 10/05/16 0742  Last data filed at 10/04/16 2300   Gross per 24 hour   Intake              500 ml   Output             1500 ml   Net            -1000 ml       GENERAL: No acute distress.  HEENT: Neck supple, oropharynx clear, No lymphadenopathy.   LUNGS: Clear to auscultation bilaterally, no wheezes with forced expiration, respirations unlabored.   CARDIOVASCULAR: Regular rate without audible murmurs.   ABDOMEN: Bowel sounds present. Soft, non-tender, no organomegaly noted.   EXTREMITIES: no edema, no cyanosis or clubbing noted.   SKIN: No rashes.   NEURO: Unremarkable   MENTAL STATUS: Alert   LYMPHATIC: No enlarged nodes      Meds:   Scheduled Meds:  Current Facility-Administered Medications   Medication Dose Route Frequency   . albuterol-ipratropium  3 mL Nebulization Q4H WA   . amLODIPine-valsartan (EXFORGE) combination tablet 10-320 mg   Oral  Daily   . aspirin  81 mg Oral Daily   . azithromycin  500 mg Intravenous Q24H SCH   . carvedilol  25 mg Oral Q12H SCH   . cefTRIAXone  1 g Intravenous Q24H SCH   . enoxaparin  40 mg Subcutaneous Daily   . famotidine  20 mg Oral Q12H SCH   . fluticasone furoate-vilanterol  1 puff Inhalation QAM   . furosemide  20 mg Intravenous Daily   . guaiFENesin  600 mg Oral Q12H SCH   . insulin glargine  50 Units Subcutaneous QHS   . insulin lispro  8 Units Subcutaneous TID MEALS   . lactobacillus/streptococcus  1 capsule Oral Daily   . niacin  500 mg Oral QHS   . predniSONE  20 mg Oral Daily    Followed by   . [START ON 10/08/2016] predniSONE  15 mg Oral Daily    Followed by   . [START ON 10/11/2016] predniSONE  10 mg Oral Daily    Followed by   . [START ON 10/14/2016] predniSONE  5 mg Oral Daily   .  SITagliptin  50 mg Oral Daily     Continuous Infusions:   PRN Meds:.albuterol-ipratropium, benzocaine-menthol, Nursing communication: Adult Hypoglycemia Treatment Algorithm **AND** dextrose **AND** dextrose **AND** glucagon (rDNA), insulin lispro, insulin lispro, naloxone, zolpidem      Labs:     Results     Procedure Component Value Units Date/Time    Glucose Whole Blood - POCT [[id number]]  (Abnormal) Collected:  10/04/16 2021     Updated:  10/04/16 2052     POCT - Glucose Whole blood 378 (H) mg/dL     MYCOPLASMA PNEUMONIAE AB IGM, EIA [[id number]] Collected:  10/02/16 0456     Updated:  10/04/16 1854     M. pneumoniae Ab IgM, EIA 306    Glucose Whole Blood - POCT [[id number]]  (Abnormal) Collected:  10/04/16 1626     Updated:  10/04/16 1633     POCT - Glucose Whole blood 303 (H) mg/dL     Blood Culture Aerobic/Anaerobic #2 [[id number]] Collected:  10/01/16 0949    Specimen:  Arm from Blood Updated:  10/04/16 1221    Narrative:       ORDER#: [id number]                                    ORDERED BY: POTLURI, JAGADI  SOURCE: Blood RAC                                    COLLECTED:  10/01/16 09:49  ANTIBIOTICS AT COLL.:                                 RECEIVED :  10/01/16 12:12  Culture Blood Aerobic and Anaerobic        PRELIM      10/04/16 12:21  10/02/16   No Growth after 1 day/s of incubation.  10/03/16   No Growth after 2 day/s of incubation.  10/04/16   No Growth after 3 day/s of incubation.      Blood Culture Aerobic/Anaerobic #1 [[id number]] Collected:  10/01/16 0958    Specimen:  Arm from Blood Updated:  10/04/16 1221    Narrative:       ORDER#: [id number]                                    ORDERED BY: POTLURI, JAGADI  SOURCE: Blood LFA                                    COLLECTED:  10/01/16 09:58  ANTIBIOTICS AT COLL.:                                RECEIVED :  10/01/16 12:12  Culture Blood Aerobic and Anaerobic        PRELIM      10/04/16 12:21  10/02/16   No Growth after 1 day/s of incubation.  10/03/16   No Growth after 2 day/s of incubation.  10/04/16   No Growth after 3 day/s of incubation.      Glucose Whole Blood - POCT [[id number]]  (Abnormal) Collected:  10/04/16 1112     Updated:  10/04/16 1142     POCT - Glucose Whole blood 310 (H) mg/dL     Throat Culture [[id number]] Collected:  10/01/16 2110    Specimen:  Throat Updated:  10/04/16 1046    Narrative:       ORDER#: [id number]                                    ORDERED BY: Faythe Casa, [place]  SOURCE: Throat                                       COLLECTED:  10/01/16 21:10  ANTIBIOTICS AT COLL.:                                RECEIVED :  10/02/16 02:43  Urinalysis, with Microscopic if indicated was cancelled on 10/01/16 at 19:21 by HIS; RBS rule  Throat Culture                             FINAL       10/04/16 10:46  10/04/16   No Beta Hemolytic Streptococcus Group A, C, or G isolated,             no further work.      CULTURE + Dierdre Forth [[id number]] Collected:  10/01/16 2110    Specimen:  Sputum from Sputum, Expectorated Updated:  10/04/16 1038    Narrative:       ORDER#: [id number]                                    ORDERED BY: Sharen Counter  SOURCE: Sputum,  Expectorated lungs                   COLLECTED:  10/01/16 21:10  ANTIBIOTICS AT COLL.:                                RECEIVED :  10/02/16 02:41  Stain, Gram (Respiratory)                  FINAL       10/02/16 03:46  10/02/16   Few WBC's             No Epithelial cells             Few Mixed Respiratory Flora  Culture and Gram Stain, Aerobic, RespiratorFINAL       10/04/16 10:38  10/03/16   Moderate growth of mixed upper respiratory flora      Glucose Whole Blood - POCT [[id number]]  (Abnormal) Collected:  10/04/16 0714     Updated:  10/04/16 0744     POCT - Glucose Whole blood 245 (H) mg/dL             Radiology:     Radiology Results (24 Hour)     Procedure Component Value Units Date/Time    XR Chest 2 Views [[id number]] Collected:  10/04/16 1103    Order Status:  Completed Updated:  10/04/16 1110    Narrative:  Clinical History: Follow-up.    Findings: PA and lateral views of the chest. Compared with prior studies  including 10/01/2016.     Calcification and mild tortuosity of the thoracic aorta. Single lead  pacemaker wire lies over the cavoatrial junction region. Cardiac  silhouette is borderline enlarged. Cardiac silhouette appears larger  than previously. Patchy bilateral interstitial/airspace disease with  perihilar and basilar predominance has mildly improved.      Impression:         1. New borderline cardiomegaly.  2. Mildly improved bilateral interstitial/airspace disease which may  represent edema or infiltrate.    Darra Lis, MD   10/04/2016 11:06 AM            Signed by: Elvera Lennox, MD  Date/Time: 10/05/2016 7:42 AM

## 2016-10-05 NOTE — Discharge Summary (Signed)
[name] HOSPITALIST   [place] Summary   Patient Info:   Date/Time: 10/05/2016 / 6:05 PM   Admit Date:10/01/2016  Patient Name:[name] [name]   MWN:[id number]   PCP: Zorita Pang, MD  Attending Physician:Jawana Reagor, Betsey Holiday, MD     Hospital Course:   Please see H&P for complete details of HPI and ROS. The patient was admitted to [facility] and has been diagnosed with Hospital Problems:  Active Problems:    Dyspnea, unspecified type   and has been taken care as mentioned below.    1. Bronchitis with community-acquired pneumonia with mild respiratory insufficiency   2.  Hoarseness of voice possible component of laryngitis with bronchitis   3. Acute kidney injury  4.  Hyperkalemia   5. Elevated lactic acid level, likely due to dehydration giving acute kidney injury. Normalized.   6. Cardiomyopathy, ejection fraction of 20 percent status post implantable cardiac defibrillator placement.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  7. History of diabetes mellitus  8. History of myocardial infarction in 2011  9. History of hypertension  10. Incidental CT finding in October 2015 showed several large exophytic kidney cysts  All of the above condition present on admission     This is a very pleasant 70 year old gentleman with past medical history of cardiomyopathy, ejection fraction of 20 percent status post implantable cardiac defibrillator placement, diabetes mellitus, coronary artery disease status post MI in 2011 and 2014, hypertension, came in with cough for a few days with exertional shortness of breath, cough, which is not very productive and chest tightness.  He also noticed husky voice, nasal drip and congestion, wheezing.  Deny any fevers or chills, sore throat, chest pain.  Patient reported vomiting last week after coughing spell.  The time of examination Patient went to see primary care physician, Dr. Altamease Oiler and started on Augmentin, inhaler and cough syrup with less  tightness of the chest.  However, symptoms persisted and patient decided to come into the emergency department.  Patient denied any orthopnea or PND.  Patient stated that he was away at primary care physician office 213 pounds, which pretty much at his baseline.  No sick contact or recent travel.  No leg swelling or pain.  Patient had a pneumonia shot last year, but has not have any flu shot just what this ER.  No dizziness, headache.  Syncope.  No more vomiting, abdominal pain, urinary symptom, diarrhea      1. Bronchitis with community-acquired pneumonia with mild respiratory insufficiency. Now stable on room air. No fever. WBC remain elevated, likely steroid induced.  --- Received  Rocephin and azithromycin, probiotics. Will be discharged with Ceftin.   ---IV Solu-Medrol to taper PO Prednisone.    ---Influenza test negative. Sputum culture showed a few mixed respiratory flora, urine for Legionella antigen, pneumococcal antigen negative mycoplasma IgM not elevated. Adenovirus, Bordetella pertussis, Chlamydophila pneumoniae, Corona virus HKU1, NL63, OC 43, Human RHina virus, Influenza A, A/H1, AH1-2009, Influenza B, Parainfluenza 1,2,3,4 not detected.   ---Pulmonary recommending outpatient pulmonary function tests to evaluate for asthma  ---Flonase nasal spray, Zyrtec, add Cepacol lozenges with Robitussin DM and mucinex for cough, Hycodan  ---Elevated  pro-calcitonin 4.5  ---Negative troponins. BNP only slightly elevated at 195  ---EKG showed T wave inversion on V5, appear chronic  ---Chest x-ray on September 30, 2016 showed upper lobe vascular prominence with peribronchial cuffing. Asymmetry hazy right lower lung as type obesity may represent pneumonia. Repeat chest x-ray on October 01, 2016  showed no acute pulmonary cardio process.  Repeat chest x-ray today showed new borderline cardiomegaly. Mildly improved bilateral Insterstitial /aorspace disease which may represent edema or infiltrate.     2.   Hoarseness of voice.  Patient and wife thinks it is his baseline. May have component of laryngitis with bronchitis as well. Sounds better than admission. No inspiratory stridor.   --- Asked patient and wife to follow up with ENT to rule out any vocal cord or laryngeal pathology if persist.     3. Acute kidney injury. Creatinine of 1.3 from 1.4.  Patient baseline creatinine is 0.9  --Discharge creatinine 1.2    4.  Hyperkalemia of 5.6.  Mild metabolic acidosis of bicarbonate of 19. Resolved.   ---  Hold Spironolactone. Discussed with patient and wife to recheck potassium level again with PCP and could restart if stable.      5. Elevated lactic acid level, likely due to dehydration giving acute kidney injury. Normalized.   ---Blood culture update negative  ---Negative urinalysis  ---Encourage by mouth fluid. Will be cautious with IV fluids    6. 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 Mild to moderate mitral regurgitation. 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  ---negative troponins  --On Coreg, spironolectone  --- Now have rales. Will start lasix 20 mg daily and monitor renal function    7. History of diabetes mellitus.  Uncontrolled as patient is on steroid   ---continue Januvia 50 mg with Lantus 50 units evening.  High scale.  Sliding scale insulin. Add bolus insulin Novolog 5 units with meals.  ---1800 ADA diet and Accu-Chek.     8. History of myocardial infarction in 2011. Patient currently on aspirin, statin and beta blocker    9. History of hypertension. Blood pressure stays between 140-150  ---Continue normal max Coreg    10. Incidental CT finding in October 2015 showed several large exophytic kidney cysts. Findings discussed with patient and wife at bedside to follow up closely with PCP Dr. Altamease Oiler as cancer could not be  excluded.     Disposition continue steroids, antibiotics. Monitor renal function Finding and plan discussed with patient . Patient high risk for clinical decompensation and readmission.    Disposition: Home  Condition at Discharge and Prognosis: stable, prognosis guarded  Admission Date:10/01/2016  Discharge Date: 10/05/16  Type of Admission:Inpatient  Medical Necessity for stay: Acute bronchitis and pneumonia  Code Status: Full Code  Subjective at the time of discharge:     Chief Complaint:  Chest Pain and Shortness of Breath  Appear comfortable. Cough much better. No wheezing. No diarrhea  Objective:     Vitals:    10/05/16 0827 10/05/16 0931 10/05/16 1251 10/05/16 1747   BP:  107/70 126/83 129/86   Pulse:  96 86 85   Resp: 20 19 22 20    Temp:  97.7 F (36.5 C) 97.9 F (36.6 C) 98.2 F (36.8 C)   TempSrc:       SpO2: 92% 93% 94% 91%   Weight:       Height:         Physical Exam:   Physical Exam   Constitutional: He is oriented to person, place, and time. No distress.   Eyes: No scleral icterus.   Cardiovascular: Normal rate and regular rhythm.    No murmur heard.  Pulmonary/Chest: Effort normal and breath sounds  normal. No respiratory distress. He has no wheezes. He has no rales.   Abdominal: Soft. Bowel sounds are normal. He exhibits no distension. There is no tenderness. There is no rebound and no guarding.   Musculoskeletal: He exhibits no edema.   Neurological: He is alert and oriented to person, place, and time. No cranial nerve deficit. GCS score is 15.   Skin: Skin is warm.   Psychiatric: Mood, memory, affect and judgment normal.     Clinical Presentation:   History of Presenting Illness: Please refer to HPI in the Detailed H&P  Discharge Diagnosis and Instructions:   Hospital Problems:Active Problems:    Dyspnea, unspecified type    Lists the present on admission hospital problems:Present on Admission:  . Dyspnea, unspecified type    Consultants: Dr. Tyna Jaksch, Pulmonary      Discharge  Medications:   Discharge Medications:    Chirag, Krueger   Home Medication Instructions ZOX:[id number]    Printed on:10/12/16 2202   Medication Information                      albuterol (PROVENTIL HFA;VENTOLIN HFA) 108 (90 Base) MCG/ACT inhaler  Inhale 2 puffs into the lungs every 4 (four) hours as needed for Wheezing.             amlodipine-valsartan (EXFORGE) 10-320 MG per tablet  TAKE 1 TABLET BY MOUTH DAILY.             aspirin 81 MG chewable tablet  Chew 1 tablet (81 mg total) by mouth daily.             carbamide peroxide (DEBROX) 6.5 % otic solution  Place 5 drops into the left ear 2 (two) times daily. Follow up with your PCP for cleaning on Monday.             famotidine (PEPCID) 20 MG tablet  TAKE 1 TABLET BY MOUTH TWICE A DAY             fluticasone-salmeterol (ADVAIR DISKUS) 100-50 MCG/DOSE Aerosol Powder, Breath Activtivatede  Inhale 1 puff into the lungs 2 (two) times daily.             furosemide (LASIX) 20 MG tablet  Take 1 tablet (20 mg total) by mouth daily.             glucose blood (ONE TOUCH ULTRA TEST) test strip  1 each by Other route 2 (two) times daily.Use as instructed             guaiFENesin (MUCINEX) 600 MG 12 hr tablet  Take 1 tablet (600 mg total) by mouth every 12 (twelve) hours.             hydrocodone-chlorpheniramine (TUSSIONEX) 10-8 MG/5ML suspension  Take 5 mLs by mouth 2 (two) times daily.             insulin glargine (LANTUS SOLOSTAR) 100 UNIT/ML injection pen  50 units SQ at HS per pt             lactobacillus/streptococcus (RISAQUAD) Cap  Take 1 capsule by mouth daily.             niacin (NIASPAN) 500 MG CR tablet  TAKE 1 TABLET BY MOUTH EVERY DAY AT BEDTIME             predniSONE (DELTASONE) 10 MG tablet  Take 4 tablet daily by mouthx2 day then 3 tab dailyx3 day then 2 tab dailyx3day then 1  tab dailyx3 day then half tab dailyx3 day then stop             SITagliptin-metFORMIN (JANUMET) 50-1000 MG per tablet  Take 1 tablet by mouth 2 (two) times daily with meals.              zolpidem (AMBIEN) 10 MG tablet  Take 10 mg by mouth nightly as needed for Sleep.               Follow up recommendations:   Follow up:   Follow-up Information     Zorita Pang, MD. Schedule an appointment as soon as possible for a visit in 3 day(s).    Specialty:  Family Medicine  Why:  check CBC, BMP, magnesium  Contact information:  [address]  [address]  [place] [address]  [phone]             Coralyn Pear, MD. Schedule an appointment as soon as possible for a visit in 7 day(s).    Specialty:  Cardiology  Why:  Cardiology  Contact information:  [address]  [address]  [place] [address]  [phone]             Elvera Lennox, MD. Schedule an appointment as soon as possible for a visit in 7 day(s).    Specialties:  Pulmonary Disease, Critical Care Medicine, Internal Medicine  Why:  Lung doctor  Contact information:  [address]  [address]  [place] [address]  [phone]                  Results of Labs/imaging:   Labs have been reviewed:   Coagulation Profile:       CBC review:   Recent Labs  Lab 10/05/16  1559 10/04/16  0553 10/03/16  0735 10/01/16  0949   WBC 14.99* 15.12* 15.76* 10.38   Hgb 13.7 13.0 13.8 14.8   Hematocrit 39.3* 36.2* 40.4* 41.5*   Platelets 320 266 264 253   MCV 99.0 98.4 93.5 97.2   RDW 15 14 13 15    Segmented Neutrophils  --   --  83 51     Chem Review:  Recent Labs  Lab 10/05/16  1559 10/04/16  0553 10/03/16  1910 10/03/16  0607 10/01/16  0949   Sodium 138 137  --  134* 139   Potassium 5.0 5.0 5.4* 5.6* 5.0   Chloride 102 105  --  106 104   CO2 27 24  --  19* 25   BUN 26.1 27.8  --  35.1* 20.1   Creatinine 1.2 1.3  --  1.4* 1.4*   Glucose 135* 283*  --  343* 191*   Calcium 9.5 8.8  --  8.9 9.5   Magnesium 2.2  --   --  2.2  --    Bilirubin, Total  --  0.4  --   --  0.7   AST (SGOT)  --  8  --   --  17   ALT  --  9  --   --  15   Alkaline Phosphatase  --  81  --   --  93     Results     Procedure Component Value Units Date/Time    Respiratory Pathogen Panel, PCR  (Film Array) [[id number]] Collected:  10/05/16 1731     Updated:  10/05/16 1731    Narrative:       Viral Transport Media (VTM)    Cell  MorpHology [[id number]]  (Abnormal) Collected:  10/05/16 1559     Updated:  10/05/16 1720     Cell Morphology: Abnormal (A)     Platelet Estimate Normal     Dual RBC population Present (A)    CBC without differential [[id number]]  (Abnormal) Collected:  10/05/16 1559    Specimen:  Blood from Blood Updated:  10/05/16 1719     WBC 14.99 (H) x10 3/uL      Hgb 13.7 g/dL      Hematocrit 78.2 (L) %      Platelets 320 x10 3/uL      RBC 3.97 (L) x10 6/uL      MCV 99.0 fL      MCH 34.5 (H) pg      MCHC 34.9 g/dL      RDW 15 %      MPV 12.2 fL      Nucleated RBC 0.0 /100 WBC      Absolute NRBC 0.00 x10 3/uL     Magnesium [[id number]] Collected:  10/05/16 1559    Specimen:  Blood Updated:  10/05/16 1650     Magnesium 2.2 mg/dL     GFR [[id number]] Collected:  10/05/16 1559     Updated:  10/05/16 1650     EGFR >60.0    Basic Metabolic Panel [[id number]]  (Abnormal) Collected:  10/05/16 1559    Specimen:  Blood Updated:  10/05/16 1650     Glucose 135 (H) mg/dL      BUN 01.0 mg/dL      Creatinine 1.2 mg/dL      Calcium 9.5 mg/dL      Sodium 272 mEq/L      Potassium 5.0 mEq/L      Chloride 102 mEq/L      CO2 27 mEq/L      Anion Gap 9.0    Glucose Whole Blood - POCT [[id number]]  (Abnormal) Collected:  10/05/16 1602     Updated:  10/05/16 1621     POCT - Glucose Whole blood 154 (H) mg/dL     Blood Culture Aerobic/Anaerobic #2 [[id number]] Collected:  10/01/16 0949    Specimen:  Arm from Blood Updated:  10/05/16 1221    Narrative:       ORDER#: [id number]                                    ORDERED BY: POTLURI, JAGADI  SOURCE: Blood RAC                                    COLLECTED:  10/01/16 09:49  ANTIBIOTICS AT COLL.:                                RECEIVED :  10/01/16 12:12  Culture Blood Aerobic and Anaerobic        PRELIM      10/05/16 12:21  10/02/16   No Growth after 1 day/s of incubation.  10/03/16    No Growth after 2 day/s of incubation.  10/04/16   No Growth after 3 day/s of incubation.  10/05/16   No Growth after 4 day/s of incubation.      Blood Culture Aerobic/Anaerobic #1 [[id number]] Collected:  10/01/16 0958    Specimen:  Arm from Blood Updated:  10/05/16 1221    Narrative:       ORDER#: [id number]                                    ORDERED BY: POTLURI, JAGADI  SOURCE: Blood LFA                                    COLLECTED:  10/01/16 09:58  ANTIBIOTICS AT COLL.:                                RECEIVED :  10/01/16 12:12  Culture Blood Aerobic and Anaerobic        PRELIM      10/05/16 12:21  10/02/16   No Growth after 1 day/s of incubation.  10/03/16   No Growth after 2 day/s of incubation.  10/04/16   No Growth after 3 day/s of incubation.  10/05/16   No Growth after 4 day/s of incubation.      Glucose Whole Blood - POCT [[id number]]  (Abnormal) Collected:  10/05/16 1112     Updated:  10/05/16 1119     POCT - Glucose Whole blood 260 (H) mg/dL     Glucose Whole Blood - POCT [[id number]]  (Abnormal) Collected:  10/05/16 0730     Updated:  10/05/16 0746     POCT - Glucose Whole blood 275 (H) mg/dL     Glucose Whole Blood - POCT [[id number]]  (Abnormal) Collected:  10/04/16 2021     Updated:  10/04/16 2052     POCT - Glucose Whole blood 378 (H) mg/dL     MYCOPLASMA PNEUMONIAE AB IGM, EIA [[id number]] Collected:  10/02/16 0456     Updated:  10/04/16 1854     M. pneumoniae Ab IgM, EIA 306        Radiology reports have been reviewed:  Radiology Results (24 Hour)     ** No results found for the last 24 hours. **        Xr Chest 2 Views    Result Date: 10/04/2016  Clinical History: Follow-up. Findings: PA and lateral views of the chest. Compared with prior studies including 10/01/2016. Calcification and mild tortuosity of the thoracic aorta. Single lead pacemaker wire lies over the cavoatrial junction region. Cardiac silhouette is borderline enlarged. Cardiac silhouette appears larger than previously. Patchy bilateral  interstitial/airspace disease with perihilar and basilar predominance has mildly improved.     1. New borderline cardiomegaly. 2. Mildly improved bilateral interstitial/airspace disease which may represent edema or infiltrate. Darra Lis, MD 10/04/2016 11:06 AM    Xr Chest 2 Views    Result Date: 09/30/2016  CLINICAL INFORMATION: 70 year old. Cough and shortness of breath for one week. TECHNIQUE AND FINDINGS: Chest: PA and lateral. Comparison with 02/14/2016. Stable ICD with a single lead terminating in the SVC. Heart size is within normal limits. Upper lobe veins are prominent and there is mild peribronchial cuffing such a there could be mild CHF with early interstitial edema. There is a focal hazy asymmetric groundglass type opacity in the right lower lung medially on the frontal film which is difficult to precisely localize on the lateral film but likely is in the lower lobe. This could represent pneumonia in the proper clinical setting. Given the history of underlying  heart issues the finding could also represent asymmetric edema. Clinical correlation will be required in this regard. No Pleural effusion or evidence of adenopathy. No significant abnormality of the aorta. No subdiaphragmatic free air.     1. Upper lobe vascular prominence with peribronchial cuffing in the setting of an ICD raises the possibility of mild CHF with interstitial edema. 2. Asymmetric hazy right lower lung airspace type opacity may represent pneumonia in the proper clinical setting or asymmetric edema. Clinical correlation required for differentiation. Annabell Sabal, MD 09/30/2016 3:20 PM    Xr Chest  Ap Portable    Result Date: 10/01/2016  CLINICAL HISTORY: Chest pain COMPARISON: Chest x-ray dated 09/30/2016 AP view of the chest was performed. FINDINGS: There is a left-sided pacemaker with the lead wires in appropriate position. The lungs are clear.  The heart size and bones are within normal limits.       No acute  cardiopulmonary disease. Annamary Carolin, MD 10/01/2016 10:00 AM    Pulmonary Function Tests Scan    Result Date: 10/03/2016  Ordered by an unspecified provider.    Pathology:   Specimens     None        Pending Lab Results:   Labs/Images to be followed at your PCP office: Unresulted Labs     Procedure . . . Date/Time    Respiratory Pathogen Panel, PCR (Film Array) [[id number]] Collected:  10/05/16 1731     Updated:  10/05/16 1731    Narrative:       Viral Transport Media (VTM)         Hospitalist:   Signed by: Venancio Chenier Elmon Else Zenola Dezarn  10/05/2016 6:05 PM  Time spent for discharge: 42 minutes      *This note was generated by the Epic EMR system/ Dragon speech recognition and may contain inherent errors or omissions not intended by the user. Grammatical errors, random word insertions, deletions, pronoun errors and incomplete sentences are occasional consequences of this technology due to software limitations. Not all errors are caught or corrected. If there are questions or concerns about the content of this note or information contained within the body of this dictation they should be addressed directly with the author for clarification

## 2016-10-05 NOTE — Plan of Care (Signed)
Problem: Safety  Goal: Patient will be free from injury during hospitalization  Outcome: Progressing   10/05/16 1808   Goal/Interventions addressed this shift   Patient will be free from injury during hospitalization  Assess patient's risk for falls and implement fall prevention plan of care per policy;Provide and maintain safe environment;Use appropriate transfer methods;Ensure appropriate safety devices are available at the bedside;Include patient/ family/ care giver in decisions related to safety;Hourly rounding;Assess for patients risk for elopement and implement Elopement Risk Plan per policy     Call bell within reach. Bed in low locked position with bed alarm set. RN instructed pt to call for assistance.     Problem: Discharge Barriers  Goal: Patient will be discharged home or other facility with appropriate resources  Outcome: Progressing   10/05/16 1808   Goal/Interventions addressed this shift   Discharge to home or other facility with appropriate resources Provide appropriate patient education;Provide information on available health resources;Initiate discharge planning       Problem: Psychosocial and Spiritual Needs  Goal: Demonstrates ability to cope with hospitalization/illness  Outcome: Progressing   10/05/16 1808   Goal/Interventions addressed this shift   Demonstrates ability to cope with hospitalizations/illness Encourage verbalization of feelings/concerns/expectations;Provide quiet environment;Assist patient to identify own strengths and abilities;Encourage patient to set small goals for self;Encourage participation in diversional activity;Include patient/ patient care companion in decisions       Problem: Inadequate Gas Exchange  Goal: Adequate oxygenation and improved ventilation  Outcome: Progressing   10/05/16 1808   Goal/Interventions addressed this shift   Adequate oxygenation and improved ventilation Assess lung sounds;Monitor SpO2 and treat as needed;Teach/reinforce use of incentive  spirometer 10 times per hour while awake, cough and deep breath as needed;Plan activities to conserve energy: plan rest periods;Increase activity as tolerated/progressive mobility;Consult/collaborate with Respiratory Therapy

## 2016-10-05 NOTE — Progress Notes (Signed)
Home O2 eval  At rest on RA - 94%  O2 sat with ambulation 95% on RA  Ambulation with O2 97% on 2L NC

## 2016-10-05 NOTE — Discharge Instr - AVS First Page (Signed)
Reason for your Hospital Admission:  Pneumonia with acute bronchitis  Hyperkalemia      Instructions for after your discharge:  Activity as tolerated  Diet: Low salt, low sugar, low carbohydrate, fluid restriction not more than 1.8 liters per day  Daily weight, if you gain weight rapidly could be sign of fluid building up and call your doctor  Spironolactone temporally stop due to high potassium. Discuss with your doctor when to restart  Please return to emergency department if you have symptom including but not limited to fever, worsening cough, sputum, shortness of breath, chest pain, profuse diarrhea, swelling of legs, low urine output with rapid weight gain or any other concerning symptoms

## 2016-10-06 ENCOUNTER — Encounter (INDEPENDENT_AMBULATORY_CARE_PROVIDER_SITE_OTHER): Payer: Self-pay | Admitting: Family Medicine

## 2016-10-06 ENCOUNTER — Telehealth: Payer: Self-pay

## 2016-10-06 LAB — RESPIRATORY PATHOGEN PANEL, PCR (FILMARRAY) (SOFT)
Adenovirus: NOT DETECTED
Bordetella pertussis: NOT DETECTED
Chlamydophila pneumoniae: NOT DETECTED
Coronavirus 229E: NOT DETECTED
Coronavirus HKU1: NOT DETECTED
Coronavirus NL63: NOT DETECTED
Coronavirus OC43: NOT DETECTED
Human Metapneumovirus: NOT DETECTED
Human Rhinovirus/Enterovirus: NOT DETECTED
Influenza A/H1: NOT DETECTED
Influenza A/H3: NOT DETECTED
Influenza A: NOT DETECTED
Influenza AH1 - 2009: NOT DETECTED
Influenza B: NOT DETECTED
Mycoplasma pneumoniae: NOT DETECTED
Parainfluenza Virus 1: NOT DETECTED
Parainfluenza Virus 2: NOT DETECTED
Parainfluenza Virus 3: NOT DETECTED
Parainfluenza Virus 4: NOT DETECTED
Respiratory Syncytial Virus: NOT DETECTED

## 2016-10-06 NOTE — Telephone Encounter (Signed)
Post Acute Discharge Call    Hospital /  ED Discharge Date: [facility] / 10/05/16  Primary Discharge Dx: Hypoxia  Secondary Dx:   Hospital / ED Discharge Appt with PCP: NO       Placed call to patient to follow up on recent hospital discharge, assess current status, address questions/concerns regarding discharge instructions / medications and encourage patient to schedule Hospital Discharge follow up appt with PCP; No Answer; LVMM with contact information and request for return call.  Will continue to follow.

## 2016-10-06 NOTE — Procedures (Signed)
Service Date: 10/03/2016     Patient Type: I     PHYSICIAN/PROVIDER: Elvera Lennox MD     REFERRING PHYSICIAN:      REASON FOR PULMONARY FUNCTION TEST REPORT:  Shortness of breath.     Initial spirometry showed a severe reduction in the FEV1 and FVC with a  mild reduction in the FEV1/FVC ratio.  Review of the flow volume loop  revealed markedly suboptimal effort.  Post-bronchodilator, the patient had  a severe reduction in the FEV1, a moderate reduction in the FVC, and a mild  to moderate reduction in the FEV1/FVC ratio.  The post-bronchodilator  effort was much improved.       In conclusion, we have a patient who at the very least has severe air flow  obstruction (post-bronchodilator).  Given the poor effort pre-bronchodilator,  the bronchodilator response could not be reliably evaluated. A full study is  recommended to further evaluate the patient's obstruction and restriction.           D:  10/05/2016 13:07 PM by Dr. Riley Lam A. Lanora Manis, MD [phone])  T:  10/06/2016 13:04 PM by Susa Day      (Conf: [id number]) (Doc ID: [id number])

## 2016-10-07 ENCOUNTER — Telehealth: Payer: Self-pay

## 2016-10-07 NOTE — Telephone Encounter (Signed)
Placed a call again to follow up with pt. But no one answered the phone again so left a message with a request to call us back.

## 2016-10-11 ENCOUNTER — Other Ambulatory Visit (INDEPENDENT_AMBULATORY_CARE_PROVIDER_SITE_OTHER): Payer: Self-pay | Admitting: Family Medicine

## 2016-10-21 ENCOUNTER — Telehealth (INDEPENDENT_AMBULATORY_CARE_PROVIDER_SITE_OTHER): Payer: Self-pay | Admitting: Family Medicine

## 2016-10-21 ENCOUNTER — Ambulatory Visit (INDEPENDENT_AMBULATORY_CARE_PROVIDER_SITE_OTHER): Payer: Self-pay | Admitting: Cardiovascular Disease

## 2016-10-21 NOTE — Telephone Encounter (Signed)
Pt called for a refill for hydrocodone-chlorpheniramine (TUSSIONEX) 10-8 MG/5ML suspension         Preferred Pharmacies     [facility] - [place], [place] - [address] AT [phone]  ([phone] (Fax)

## 2016-10-22 ENCOUNTER — Other Ambulatory Visit (INDEPENDENT_AMBULATORY_CARE_PROVIDER_SITE_OTHER): Payer: Self-pay

## 2016-10-22 ENCOUNTER — Other Ambulatory Visit (INDEPENDENT_AMBULATORY_CARE_PROVIDER_SITE_OTHER): Payer: Self-pay | Admitting: Family Medicine

## 2016-10-22 ENCOUNTER — Telehealth (INDEPENDENT_AMBULATORY_CARE_PROVIDER_SITE_OTHER): Payer: Self-pay | Admitting: Family Medicine

## 2016-10-22 DIAGNOSIS — R059 Cough, unspecified: Secondary | ICD-10-CM

## 2016-10-22 MED ORDER — HYDROCOD POLST-CPM POLST ER 10-8 MG/5ML PO SUER
5.0000 mL | Freq: Two times a day (BID) | ORAL | 0 refills | Status: DC
Start: 2016-10-22 — End: 2016-10-23

## 2016-10-22 MED ORDER — HYDROCOD POLST-CPM POLST ER 10-8 MG/5ML PO SUER
5.0000 mL | Freq: Two times a day (BID) | ORAL | 0 refills | Status: DC
Start: 2016-10-22 — End: 2016-10-22

## 2016-10-22 NOTE — Telephone Encounter (Signed)
tussinex RX needs to be e-scripted to [facility] - cannot be faxed.

## 2016-10-22 NOTE — Addendum Note (Signed)
Addended byZorita Pang on: 10/22/2016 02:24 PM     Modules accepted: Orders

## 2016-10-22 NOTE — Telephone Encounter (Signed)
Pt is requesting a refill on his Tussionex.  Last office visit for his cough was 09/28/16.

## 2016-10-22 NOTE — Telephone Encounter (Signed)
Rx faxed to [facility]. 

## 2016-10-23 ENCOUNTER — Other Ambulatory Visit (INDEPENDENT_AMBULATORY_CARE_PROVIDER_SITE_OTHER): Payer: Self-pay

## 2016-10-23 DIAGNOSIS — R059 Cough, unspecified: Secondary | ICD-10-CM

## 2016-10-23 MED ORDER — HYDROCOD POLST-CPM POLST ER 10-8 MG/5ML PO SUER
5.0000 mL | Freq: Two times a day (BID) | ORAL | 0 refills | Status: DC
Start: 2016-10-23 — End: 2016-10-26

## 2016-10-23 NOTE — Telephone Encounter (Signed)
LOV was on 09/28/2016

## 2016-10-26 ENCOUNTER — Ambulatory Visit (INDEPENDENT_AMBULATORY_CARE_PROVIDER_SITE_OTHER): Payer: Commercial Managed Care - POS | Admitting: Family Medicine

## 2016-10-26 ENCOUNTER — Encounter (INDEPENDENT_AMBULATORY_CARE_PROVIDER_SITE_OTHER): Payer: Self-pay | Admitting: Family Medicine

## 2016-10-26 VITALS — BP 110/65 | HR 75 | Temp 96.6°F | Resp 24 | Wt 202.4 lb

## 2016-10-26 DIAGNOSIS — N281 Cyst of kidney, acquired: Secondary | ICD-10-CM

## 2016-10-26 DIAGNOSIS — R059 Cough, unspecified: Secondary | ICD-10-CM

## 2016-10-26 DIAGNOSIS — R05 Cough: Secondary | ICD-10-CM

## 2016-10-26 MED ORDER — HYDROCOD POLST-CPM POLST ER 10-8 MG/5ML PO SUER
5.0000 mL | Freq: Two times a day (BID) | ORAL | 0 refills | Status: DC
Start: 2016-10-26 — End: 2016-11-10

## 2016-10-26 NOTE — Progress Notes (Signed)
Subjective:      Date: 10/26/2016 5:57 PM   Patient ID: [name] [name] is a 71 y.o. male.    Chief Complaint:  Chief Complaint   Patient presents with   . Cough     ER follow up        Here for follow-up from recent hospitalization.   Inpatient facility: [facility]  Date of admission: December 14  Date of discharge: December 18  Discharge diagnosis: bronchitis and pneumonia  Hospital course: bronchitis, hoarseness of voice, kidney injury, hyperkalemia, elevated lactic acid level, nonischemic cardiomyopathy,  Prior hospitalization within past 30 days: No   Prior hospitalization within past 6 months: No  Comorbid conditions: diabetes and hypertension  Medical procedure(s) performed: IV hydration and IV antibiotic  Surgical procedure(s) performed: none  Diagnostic tests performed: chest x-ray  Pending tests: none  Home care: ADL's intact  Specialist follow-up: PCP, Dr Danton Clap Tressie Stalker- cardiologist, Dr Lang Snow - Pulmonary   Post-hospital monitoring: none  Mobility status: normal mobility     Medication Reconciliation(required):  Hospital discharge medications reviewed and reconciled with current medication list.     Patient feels fare better . Still coughing ,but better than before . Cough keeps him up at night .  BP and DM is controlled well at home .    Problem List:  Patient Active Problem List   Diagnosis   . Chest pain   . Diabetes mellitus   . Heart attack   . CAD (coronary artery disease)   . Non-ischemic cardiomyopathy   . Acute exacerbation of CHF (congestive heart failure)   . PUD (peptic ulcer disease)   . Elevated LFTs   . Acute dyspnea   . Elevated d-dimer   . CHF (congestive heart failure)   . Headaches due to old head injury   . Essential hypertension   . Pneumonia due to infectious organism, unspecified laterality, unspecified part of lung   . Syncope and collapse   . AICD (automatic cardioverter/defibrillator) present   . Syncope, unspecified syncope type   . Pneumonia due  to infectious organism   . Dyspnea, unspecified type       Current Medications:  Current Outpatient Prescriptions   Medication Sig Dispense Refill   . albuterol (PROVENTIL HFA;VENTOLIN HFA) 108 (90 Base) MCG/ACT inhaler Inhale 2 puffs into the lungs every 4 (four) hours as needed for Wheezing. 1 Inhaler 0   . amlodipine-valsartan (EXFORGE) 10-320 MG per tablet TAKE 1 TABLET BY MOUTH DAILY. 90 tablet 2   . aspirin 81 MG chewable tablet Chew 1 tablet (81 mg total) by mouth daily.  0   . carbamide peroxide (DEBROX) 6.5 % otic solution Place 5 drops into the left ear 2 (two) times daily. Follow up with your PCP for cleaning on Monday. 15 mL 0   . carvedilol (COREG) 25 MG tablet TAKE 1 TABLET BY MOUTH TWICE A DAY WITH MEALS 60 tablet 0   . famotidine (PEPCID) 20 MG tablet TAKE 1 TABLET BY MOUTH TWICE A DAY 60 tablet 0   . fluticasone-salmeterol (ADVAIR DISKUS) 100-50 MCG/DOSE Aerosol Powder, Breath Activtivatede Inhale 1 puff into the lungs 2 (two) times daily. 1 each 5   . furosemide (LASIX) 20 MG tablet Take 1 tablet (20 mg total) by mouth daily. 10 tablet 0   . glucose blood (ONE TOUCH ULTRA TEST) test strip 1 each by Other route 2 (two) times daily.Use as instructed 300 each 1   . guaiFENesin (MUCINEX)  600 MG 12 hr tablet Take 1 tablet (600 mg total) by mouth every 12 (twelve) hours. 20 tablet 0   . insulin glargine (LANTUS SOLOSTAR) 100 UNIT/ML injection pen 50 units SQ at HS per pt 15 pen 3   . lactobacillus/streptococcus (RISAQUAD) Cap Take 1 capsule by mouth daily. 30 capsule 0   . niacin (NIASPAN) 500 MG CR tablet TAKE 1 TABLET BY MOUTH EVERY DAY AT BEDTIME  3   . predniSONE (DELTASONE) 10 MG tablet Take 4 tablet daily by mouthx2 day then 3 tab dailyx3 day then 2 tab dailyx3day then 1 tab dailyx3 day then half tab dailyx3 day then stop 30 tablet 0   . SITagliptin-metFORMIN (JANUMET) 50-1000 MG per tablet Take 1 tablet by mouth 2 (two) times daily with meals. (Patient taking differently: Take 1 tablet by mouth  daily.    ) 180 tablet 3   . zolpidem (AMBIEN) 10 MG tablet Take 10 mg by mouth nightly as needed for Sleep.     . hydrocodone-chlorpheniramine (TUSSIONEX) 10-8 MG/5ML suspension Take 5 mLs by mouth 2 (two) times daily. 115 mL 0     Current Facility-Administered Medications   Medication Dose Route Frequency Provider Last Rate Last Dose   . levalbuterol (XOPENEX) nebulizer solution 1.25 mg  1.25 mg Nebulization TID Zorita Pang, MD           Allergies:  No Known Allergies    Past Medical History:  Past Medical History:   Diagnosis Date   . AICD (automatic cardioverter/defibrillator) present 2014   . CAD (coronary artery disease)    . Cardiac defibrillator in situ    . CHF (congestive heart failure) 08/12/2014   . Diabetes mellitus    . Heart attack 2011    and 2014   . Hypertension    . Ischemic cardiomyopathy     20%       Past Surgical History:  Past Surgical History:   Procedure Laterality Date   . BRING BACK OPEN HEART     . CARDIAC CATHETERIZATION     . duodenal ulcer  1973   . EGD N/A 08/15/2014    Procedure: EGD;  Surgeon: Colon Branch, MD;  [facility];  Service: Gastroenterology;  Laterality: N/A;  egd w/ bx   . HERNIA REPAIR      [facility]   . orthopedic surgery      R foot corrective   . TONSILLECTOMY AND ADENOIDECTOMY  1954       Family History:  Family History   Problem Relation Age of Onset   . Breast cancer Mother    . Heart attack Mother 59   . Diabetes Brother        Social History:  Social History     Social History   . Marital status: Married     Spouse name: Lajoyce Corners   . Number of children: 0   . Years of education: N/A     Occupational History   . 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 Smoking status: Never Smoker   . Smokeless tobacco: Never Used   . Alcohol use No   . Drug use: No   . Sexual activity: Yes     Other Topics Concern   . Not on file     Social History Narrative   . No narrative on file       The following sections were reviewed  this  encounter by the provider:   Tobacco  Allergies  Meds  Problems  Med Hx  Surg Hx  Fam Hx  Soc Hx            Vitals:  BP 110/65   Pulse 75   Temp (!) 96.6 F (35.9 C) (Tympanic)   Resp (!) 24   Wt 91.8 kg (202 lb 6.4 oz)   SpO2 94%   BMI 27.45 kg/m         ROS:  Constitutional: Negative for fever, chills and malaise/fatigue.   Respiratory: Negative for cough, shortness of breath and wheezing.    Cardiovascular: Negative for chest pain and palpitations.   Gastrointestinal: Negative for abdominal pain, diarrhea and constipation.   Musculoskeletal: Negative for joint pain.   Skin: Negative for rash.   Neurological: Negative for dizziness and headaches.   Psychiatric/Behavioral: The patient does have insomnia.  Mostly because of cough .       Objective:     Physical Exam:  Constitutional: Patient is oriented to person, place, and time. Appears well-developed and well-nourished.   HENT:   Head: Normocephalic and atraumatic.   Eyes: Conjunctivae and EOM are normal. Pupils are equal, round, and reactive to light.   Cardiovascular: Normal rate, regular rhythm and normal heart sounds.  Exam reveals no gallop and no friction rub.    No murmur heard.  Pulmonary/Chest: Effort normal and breath sounds normal. Has no wheezes.   Musculoskeletal: Normal range of motion. Exhibits no edema or tenderness.   Neurological: Patient is alert and oriented to person, place, and time.   Psychiatric: Patient has a normal mood and affect.        Assessment/Plan:       1. Cough  - hydrocodone-chlorpheniramine (TUSSIONEX) 10-8 MG/5ML suspension; Take 5 mLs by mouth 2 (two) times daily.  Dispense: 115 mL; Refill: 0  Start Vitamin C 500 mg up to 6 tablets every day, for the next 3-4 days. Place humidifier right in front of bed, and rest.  Please f/u if symptoms persist or getting worse .        Zorita Pang, MD

## 2016-10-26 NOTE — Patient Instructions (Signed)
Start Vitamin C 500 mg up to 6 tablets every day, for the next 3-4 days. Place humidifier right in front of bed, and rest.  Please f/u if symptoms persist or getting worse .

## 2016-10-28 ENCOUNTER — Other Ambulatory Visit (FREE_STANDING_LABORATORY_FACILITY): Payer: Commercial Managed Care - POS

## 2016-10-28 DIAGNOSIS — Z4502 Encounter for adjustment and management of automatic implantable cardiac defibrillator: Secondary | ICD-10-CM

## 2016-10-28 LAB — CBC AND DIFFERENTIAL
Absolute NRBC: 0 10*3/uL
Basophils Absolute Automated: 0.02 10*3/uL (ref 0.00–0.20)
Basophils Automated: 0.3 %
Eosinophils Absolute Automated: 0.65 10*3/uL (ref 0.00–0.70)
Eosinophils Automated: 8.9 %
Hematocrit: 39.3 % — ABNORMAL LOW (ref 42.0–52.0)
Hgb: 13.7 g/dL (ref 13.0–17.0)
Immature Granulocytes Absolute: 0.03 10*3/uL
Immature Granulocytes: 0.4 %
Lymphocytes Absolute Automated: 3.7 10*3/uL (ref 0.50–4.40)
Lymphocytes Automated: 50.8 %
MCH: 34.8 pg — ABNORMAL HIGH (ref 28.0–32.0)
MCHC: 34.9 g/dL (ref 32.0–36.0)
MCV: 99.7 fL (ref 80.0–100.0)
MPV: 11.6 fL (ref 9.4–12.3)
Monocytes Absolute Automated: 0.56 10*3/uL (ref 0.00–1.20)
Monocytes: 7.7 %
Neutrophils Absolute: 2.32 10*3/uL (ref 1.80–8.10)
Neutrophils: 31.9 %
Nucleated RBC: 0 /100 WBC (ref 0.0–1.0)
Platelets: 138 10*3/uL — ABNORMAL LOW (ref 140–400)
RBC: 3.94 10*6/uL — ABNORMAL LOW (ref 4.70–6.00)
RDW: 15 % (ref 12–15)
WBC: 7.28 10*3/uL (ref 3.50–10.80)

## 2016-10-28 LAB — GFR: EGFR: >60

## 2016-10-28 LAB — BASIC METABOLIC PANEL
BUN: 15 mg/dL (ref 9.0–28.0)
CO2: 26 mEq/L (ref 21–29)
Calcium: 9.4 mg/dL (ref 8.5–10.5)
Chloride: 102 mEq/L (ref 100–111)
Creatinine: 1.2 mg/dL (ref 0.5–1.5)
Glucose: 250 mg/dL — ABNORMAL HIGH (ref 70–100)
Potassium: 5 mEq/L (ref 3.5–5.1)
Sodium: 137 mEq/L (ref 136–145)

## 2016-10-28 LAB — HEMOLYSIS INDEX: Hemolysis Index: 10 (ref 0–18)

## 2016-10-29 ENCOUNTER — Other Ambulatory Visit (INDEPENDENT_AMBULATORY_CARE_PROVIDER_SITE_OTHER): Payer: Self-pay | Admitting: Family Medicine

## 2016-10-29 DIAGNOSIS — R059 Cough, unspecified: Secondary | ICD-10-CM

## 2016-11-03 ENCOUNTER — Encounter (INDEPENDENT_AMBULATORY_CARE_PROVIDER_SITE_OTHER): Payer: Self-pay | Admitting: Family Medicine

## 2016-11-03 ENCOUNTER — Ambulatory Visit (INDEPENDENT_AMBULATORY_CARE_PROVIDER_SITE_OTHER): Payer: Commercial Managed Care - POS | Admitting: Family Medicine

## 2016-11-03 VITALS — BP 114/83 | HR 72 | Temp 98.4°F | Resp 16 | Ht 72.0 in | Wt 202.0 lb

## 2016-11-03 DIAGNOSIS — Z794 Long term (current) use of insulin: Secondary | ICD-10-CM

## 2016-11-03 DIAGNOSIS — R059 Cough, unspecified: Secondary | ICD-10-CM

## 2016-11-03 DIAGNOSIS — R05 Cough: Secondary | ICD-10-CM

## 2016-11-03 DIAGNOSIS — E119 Type 2 diabetes mellitus without complications: Secondary | ICD-10-CM

## 2016-11-03 LAB — POCT HEMOGLOBIN A1C: POCT Hgb A1C: 8.9 — AB (ref 3.9–5.9)

## 2016-11-03 NOTE — Patient Instructions (Signed)
Recommend to continue to push exercise and stay on a good diet . Try to get to the perfect BMI of 25. Start your day with a high fiber diet and  involve the family in your exercise regiment .

## 2016-11-03 NOTE — Addendum Note (Signed)
Addended byZorita Pang on: 11/03/2016 04:29 PM     Modules accepted: Orders

## 2016-11-03 NOTE — Progress Notes (Signed)
Subjective:      Date: 11/03/2016 4:21 PM   Patient ID: [name] [name] is a 71 y.o. male.    Chief Complaint:  Chief Complaint   Patient presents with   . Cough       HPI:  Cough   The current episode started more than 1 month ago. The problem has been gradually improving. The cough is productive of sputum. Pertinent negatives include no fever, nasal congestion, shortness of breath or wheezing. Nothing aggravates the symptoms. He has tried prescription cough suppressant and OTC cough suppressant for the symptoms. The treatment provided moderate relief. His past medical history is significant for bronchitis and pneumonia.     Patient feels overall good . Cough is nearly gone . Checking glucose every day . Lately down to 120    Problem List:  Patient Active Problem List   Diagnosis   . Chest pain   . Diabetes mellitus   . Heart attack   . CAD (coronary artery disease)   . Non-ischemic cardiomyopathy   . Acute exacerbation of CHF (congestive heart failure)   . PUD (peptic ulcer disease)   . Elevated LFTs   . Acute dyspnea   . Elevated d-dimer   . CHF (congestive heart failure)   . Headaches due to old head injury   . Essential hypertension   . Pneumonia due to infectious organism, unspecified laterality, unspecified part of lung   . Syncope and collapse   . AICD (automatic cardioverter/defibrillator) present   . Syncope, unspecified syncope type   . Pneumonia due to infectious organism   . Dyspnea, unspecified type       Current Medications:  Current Outpatient Prescriptions   Medication Sig Dispense Refill   . albuterol (PROVENTIL HFA;VENTOLIN HFA) 108 (90 Base) MCG/ACT inhaler Inhale 2 puffs into the lungs every 4 (four) hours as needed for Wheezing. 1 Inhaler 0   . amlodipine-valsartan (EXFORGE) 10-320 MG per tablet TAKE 1 TABLET BY MOUTH DAILY. 90 tablet 2   . amoxicillin-clavulanate (AUGMENTIN) 875-125 MG per tablet TAKE 1 TABLET BY MOUTH TWICE DAILY FOR 7 DAYS 14 tablet 0   . aspirin 81 MG chewable tablet  Chew 1 tablet (81 mg total) by mouth daily.  0   . carbamide peroxide (DEBROX) 6.5 % otic solution Place 5 drops into the left ear 2 (two) times daily. Follow up with your PCP for cleaning on Monday. 15 mL 0   . carvedilol (COREG) 25 MG tablet TAKE 1 TABLET BY MOUTH TWICE A DAY WITH MEALS 60 tablet 0   . famotidine (PEPCID) 20 MG tablet TAKE 1 TABLET BY MOUTH TWICE A DAY 60 tablet 0   . fluticasone-salmeterol (ADVAIR DISKUS) 100-50 MCG/DOSE Aerosol Powder, Breath Activtivatede Inhale 1 puff into the lungs 2 (two) times daily. 1 each 5   . furosemide (LASIX) 20 MG tablet Take 1 tablet (20 mg total) by mouth daily. 10 tablet 0   . glucose blood (ONE TOUCH ULTRA TEST) test strip 1 each by Other route 2 (two) times daily.Use as instructed 300 each 1   . guaiFENesin (MUCINEX) 600 MG 12 hr tablet Take 1 tablet (600 mg total) by mouth every 12 (twelve) hours. 20 tablet 0   . hydrocodone-chlorpheniramine (TUSSIONEX) 10-8 MG/5ML suspension Take 5 mLs by mouth 2 (two) times daily. 115 mL 0   . insulin glargine (LANTUS SOLOSTAR) 100 UNIT/ML injection pen 50 units SQ at HS per pt 15 pen 3   . lactobacillus/streptococcus (RISAQUAD)  Cap Take 1 capsule by mouth daily. 30 capsule 0   . niacin (NIASPAN) 500 MG CR tablet TAKE 1 TABLET BY MOUTH EVERY DAY AT BEDTIME  3   . predniSONE (DELTASONE) 10 MG tablet Take 4 tablet daily by mouthx2 day then 3 tab dailyx3 day then 2 tab dailyx3day then 1 tab dailyx3 day then half tab dailyx3 day then stop 30 tablet 0   . SITagliptin-metFORMIN (JANUMET) 50-1000 MG per tablet Take 1 tablet by mouth 2 (two) times daily with meals. (Patient taking differently: Take 1 tablet by mouth daily.    ) 180 tablet 3   . zolpidem (AMBIEN) 10 MG tablet Take 10 mg by mouth nightly as needed for Sleep.       Current Facility-Administered Medications   Medication Dose Route Frequency Provider Last Rate Last Dose   . levalbuterol (XOPENEX) nebulizer solution 1.25 mg  1.25 mg Nebulization TID Zorita Pang, MD            Allergies:  No Known Allergies    Past Medical History:  Past Medical History:   Diagnosis Date   . AICD (automatic cardioverter/defibrillator) present 2014   . CAD (coronary artery disease)    . Cardiac defibrillator in situ    . CHF (congestive heart failure) 08/12/2014   . Diabetes mellitus    . Heart attack 2011    and 2014   . Hypertension    . Ischemic cardiomyopathy     20%       Past Surgical History:  Past Surgical History:   Procedure Laterality Date   . BRING BACK OPEN HEART     . CARDIAC CATHETERIZATION     . duodenal ulcer  1973   . EGD N/A 08/15/2014    Procedure: EGD;  Surgeon: Colon Branch, MD;  Location[facility];  Service: Gastroenterology;  Laterality: N/A;  egd w/ bx   . HERNIA REPAIR      LIH   . orthopedic surgery      R foot corrective   . TONSILLECTOMY AND ADENOIDECTOMY  1954       Family History:  Family History   Problem Relation Age of Onset   . Breast cancer Mother    . Heart attack Mother 41   . Diabetes Brother        Social History:  Social History     Social History   . Marital status: Married     Spouse name: Lajoyce Corners   . Number of children: 0   . Years of education: N/A     Occupational History   . 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 Smoking status: Never Smoker   . Smokeless tobacco: Never Used   . Alcohol use No   . Drug use: No   . Sexual activity: Yes     Other Topics Concern   . Not on file     Social History Narrative   . No narrative on file       The following sections were reviewed this encounter by the provider:   Tobacco  Allergies  Meds  Problems  Med Hx  Surg Hx  Fam Hx  Soc Hx          Vitals:  BP 114/83   Pulse 72   Temp 98.4 F (36.9 C) (Oral)   Resp 16   Ht 1.829 m (6')   Wt 91.6 kg (  202 lb)   SpO2 97%   BMI 27.40 kg/m        ROS:  Constitutional: Negative for fever, chills and malaise/fatigue.   Respiratory: Negative for cough, shortness of breath and wheezing.    Cardiovascular: Negative for chest  pain and palpitations.   Gastrointestinal: Negative for abdominal pain, diarrhea and constipation.   Musculoskeletal: Negative for joint pain.   Skin: Negative for rash.   Neurological: Negative for dizziness and headaches.   Psychiatric/Behavioral: The patient does not have insomnia.         Objective:     Physical Exam:  Constitutional: Patient is oriented to person, place, and time. Appears well-developed and well-nourished.   HENT:   Head: Normocephalic and atraumatic.   Eyes: Conjunctivae and EOM are normal. Pupils are equal, round, and reactive to light.   Cardiovascular: Normal rate, regular rhythm and normal heart sounds.  Exam reveals no gallop and no friction rub.    No murmur heard.  Pulmonary/Chest: Effort normal and breath sounds normal. Has no wheezes.   Musculoskeletal: Normal range of motion. Exhibits no edema or tenderness.   Neurological: Patient is alert and oriented to person, place, and time.   Psychiatric: Patient has a normal mood and affect.        Assessment/Plan:       1. Cough  Mostly resolved .        Zorita Pang, MD

## 2016-11-10 ENCOUNTER — Ambulatory Visit
Admission: RE | Admit: 2016-11-10 | Discharge: 2016-11-11 | Disposition: A | Discharge status: Home / Self Care | Payer: Commercial Managed Care - POS | Source: Ambulatory Visit | Attending: Cardiovascular Disease | Admitting: Cardiovascular Disease

## 2016-11-10 ENCOUNTER — Ambulatory Visit (INDEPENDENT_AMBULATORY_CARE_PROVIDER_SITE_OTHER): Payer: Self-pay

## 2016-11-10 ENCOUNTER — Ambulatory Visit: Payer: Commercial Managed Care - POS | Admitting: Certified Registered"

## 2016-11-10 ENCOUNTER — Encounter
Admission: RE | Disposition: A | Discharge status: Home / Self Care | Payer: Self-pay | Source: Ambulatory Visit | Attending: Cardiovascular Disease

## 2016-11-10 ENCOUNTER — Ambulatory Visit: Payer: Commercial Managed Care - POS

## 2016-11-10 ENCOUNTER — Ambulatory Visit: Payer: Commercial Managed Care - POS | Admitting: Cardiovascular Disease

## 2016-11-10 DIAGNOSIS — E669 Obesity, unspecified: Secondary | ICD-10-CM | POA: Insufficient documentation

## 2016-11-10 DIAGNOSIS — I2510 Atherosclerotic heart disease of native coronary artery without angina pectoris: Secondary | ICD-10-CM | POA: Insufficient documentation

## 2016-11-10 DIAGNOSIS — R059 Cough, unspecified: Secondary | ICD-10-CM

## 2016-11-10 DIAGNOSIS — Z794 Long term (current) use of insulin: Secondary | ICD-10-CM | POA: Insufficient documentation

## 2016-11-10 DIAGNOSIS — Z8249 Family history of ischemic heart disease and other diseases of the circulatory system: Secondary | ICD-10-CM | POA: Insufficient documentation

## 2016-11-10 DIAGNOSIS — I5022 Chronic systolic (congestive) heart failure: Secondary | ICD-10-CM | POA: Insufficient documentation

## 2016-11-10 DIAGNOSIS — Z7982 Long term (current) use of aspirin: Secondary | ICD-10-CM | POA: Insufficient documentation

## 2016-11-10 DIAGNOSIS — Z6827 Body mass index (BMI) 27.0-27.9, adult: Secondary | ICD-10-CM | POA: Insufficient documentation

## 2016-11-10 DIAGNOSIS — I429 Cardiomyopathy, unspecified: Secondary | ICD-10-CM

## 2016-11-10 DIAGNOSIS — I252 Old myocardial infarction: Secondary | ICD-10-CM | POA: Insufficient documentation

## 2016-11-10 DIAGNOSIS — K219 Gastro-esophageal reflux disease without esophagitis: Secondary | ICD-10-CM | POA: Insufficient documentation

## 2016-11-10 DIAGNOSIS — T82120A Displacement of cardiac electrode, initial encounter: Secondary | ICD-10-CM | POA: Insufficient documentation

## 2016-11-10 DIAGNOSIS — I472 Ventricular tachycardia: Secondary | ICD-10-CM | POA: Insufficient documentation

## 2016-11-10 DIAGNOSIS — E7800 Pure hypercholesterolemia, unspecified: Secondary | ICD-10-CM | POA: Insufficient documentation

## 2016-11-10 DIAGNOSIS — I428 Other cardiomyopathies: Secondary | ICD-10-CM | POA: Insufficient documentation

## 2016-11-10 DIAGNOSIS — E119 Type 2 diabetes mellitus without complications: Secondary | ICD-10-CM | POA: Insufficient documentation

## 2016-11-10 DIAGNOSIS — Y831 Surgical operation with implant of artificial internal device as the cause of abnormal reaction of the patient, or of later complication, without mention of misadventure at the time of the procedure: Secondary | ICD-10-CM | POA: Insufficient documentation

## 2016-11-10 DIAGNOSIS — I110 Hypertensive heart disease with heart failure: Secondary | ICD-10-CM | POA: Insufficient documentation

## 2016-11-10 HISTORY — DX: Hyperlipidemia, unspecified: E78.5

## 2016-11-10 HISTORY — PX: OTHER SURGICAL HISTORY: SHX169

## 2016-11-10 LAB — GLUCOSE WHOLE BLOOD - POCT
Whole Blood Glucose POCT: 108 mg/dL — ABNORMAL HIGH (ref 70–100)
Whole Blood Glucose POCT: 103 mg/dL — ABNORMAL HIGH (ref 70–100)
Whole Blood Glucose POCT: 148 mg/dL — ABNORMAL HIGH (ref 70–100)
Whole Blood Glucose POCT: 184 mg/dL — ABNORMAL HIGH (ref 70–100)

## 2016-11-10 SURGERY — ICD UGRADE TO ICD BIV
Anesthesia: Anesthesia General

## 2016-11-10 MED ORDER — ONDANSETRON HCL 4 MG/2ML IJ SOLN
INTRAMUSCULAR | Status: DC | PRN
Start: 2016-11-10 — End: 2016-11-10
  Administered 2016-11-10: 4 mg via INTRAVENOUS

## 2016-11-10 MED ORDER — FENTANYL CITRATE (PF) 50 MCG/ML IJ SOLN (WRAP)
INTRAMUSCULAR | Status: AC
Start: 2016-11-10 — End: 2016-11-10
  Filled 2016-11-10: qty 2

## 2016-11-10 MED ORDER — FENTANYL CITRATE (PF) 50 MCG/ML IJ SOLN (WRAP)
INTRAMUSCULAR | Status: DC | PRN
Start: 2016-11-10 — End: 2016-11-10
  Administered 2016-11-10 (×2): 50 ug via INTRAVENOUS

## 2016-11-10 MED ORDER — ATORVASTATIN CALCIUM 20 MG PO TABS
40.0000 mg | ORAL_TABLET | Freq: Every day | ORAL | Status: DC
Start: 2016-11-10 — End: 2016-11-11
  Administered 2016-11-10 – 2016-11-11 (×2): 40 mg via ORAL
  Filled 2016-11-10 (×2): qty 2

## 2016-11-10 MED ORDER — AMOXICILLIN-POT CLAVULANATE 875-125 MG PO TABS
1.0000 | ORAL_TABLET | Freq: Two times a day (BID) | ORAL | Status: DC
Start: 2016-11-10 — End: 2016-11-11
  Administered 2016-11-10 – 2016-11-11 (×2): 1 via ORAL
  Filled 2016-11-10 (×5): qty 1

## 2016-11-10 MED ORDER — ZOLPIDEM TARTRATE 5 MG PO TABS
10.0000 mg | ORAL_TABLET | Freq: Every evening | ORAL | Status: DC | PRN
Start: 2016-11-10 — End: 2016-11-11

## 2016-11-10 MED ORDER — ONDANSETRON HCL 4 MG/2ML IJ SOLN
INTRAMUSCULAR | Status: AC
Start: 2016-11-10 — End: 2016-11-10
  Filled 2016-11-10: qty 2

## 2016-11-10 MED ORDER — IODIXANOL 320 MG/ML IV SOLN
30.0000 mL | Freq: Once | INTRAVENOUS | Status: AC | PRN
Start: 2016-11-10 — End: 2016-11-10
  Administered 2016-11-10: 30 mL via INTRAVENOUS

## 2016-11-10 MED ORDER — ACETAMINOPHEN 325 MG PO TABS
650.0000 mg | ORAL_TABLET | Freq: Four times a day (QID) | ORAL | Status: DC | PRN
Start: 2016-11-10 — End: 2016-11-11

## 2016-11-10 MED ORDER — PROPOFOL 10 MG/ML IV EMUL (WRAP)
INTRAVENOUS | Status: DC | PRN
Start: 2016-11-10 — End: 2016-11-10
  Administered 2016-11-10 (×2): 50 mg via INTRAVENOUS

## 2016-11-10 MED ORDER — MIDAZOLAM HCL 2 MG/2ML IJ SOLN
INTRAMUSCULAR | Status: DC | PRN
Start: 2016-11-10 — End: 2016-11-10
  Administered 2016-11-10: 2 mg via INTRAVENOUS

## 2016-11-10 MED ORDER — INSULIN REGULAR HUMAN 100 UNIT/ML IJ SOLN
1.0000 [IU] | Freq: Four times a day (QID) | INTRAMUSCULAR | Status: DC | PRN
Start: 2016-11-10 — End: 2016-11-11
  Filled 2016-11-10: qty 0.05

## 2016-11-10 MED ORDER — GUAIFENESIN ER 600 MG PO TB12
600.0000 mg | ORAL_TABLET | Freq: Two times a day (BID) | ORAL | Status: DC
Start: 2016-11-10 — End: 2016-11-11
  Administered 2016-11-10 – 2016-11-11 (×3): 600 mg via ORAL
  Filled 2016-11-10 (×3): qty 1

## 2016-11-10 MED ORDER — ALBUTEROL SULFATE (2.5 MG/3ML) 0.083% IN NEBU
2.5000 mg | INHALATION_SOLUTION | Freq: Four times a day (QID) | RESPIRATORY_TRACT | Status: DC
Start: 2016-11-10 — End: 2016-11-11
  Administered 2016-11-10 – 2016-11-11 (×4): 2.5 mg via RESPIRATORY_TRACT
  Filled 2016-11-10 (×4): qty 3

## 2016-11-10 MED ORDER — EPHEDRINE SULFATE 50 MG/ML IJ/IV SOLN (WRAP)
Status: AC
Start: 2016-11-10 — End: 2016-11-10
  Filled 2016-11-10: qty 1

## 2016-11-10 MED ORDER — ALBUTEROL SULFATE (2.5 MG/3ML) 0.083% IN NEBU
INHALATION_SOLUTION | RESPIRATORY_TRACT | Status: AC
Start: 2016-11-10 — End: 2016-11-10
  Administered 2016-11-10: 2.5 mg via RESPIRATORY_TRACT
  Filled 2016-11-10: qty 3

## 2016-11-10 MED ORDER — MIDAZOLAM HCL 2 MG/2ML IJ SOLN
INTRAMUSCULAR | Status: AC
Start: 2016-11-10 — End: 2016-11-10
  Filled 2016-11-10: qty 2

## 2016-11-10 MED ORDER — GLUCOSE 40 % PO GEL
15.0000 g | ORAL | Status: DC | PRN
Start: 2016-11-10 — End: 2016-11-11

## 2016-11-10 MED ORDER — CEFAZOLIN SODIUM-DEXTROSE 1-4 GM-% IV SOLR
1.0000 g | Freq: Three times a day (TID) | INTRAVENOUS | Status: AC
Start: 2016-11-10 — End: 2016-11-11
  Administered 2016-11-10 – 2016-11-11 (×3): 1 g via INTRAVENOUS
  Filled 2016-11-10 (×3): qty 50

## 2016-11-10 MED ORDER — EPHEDRINE SULFATE 50 MG/ML IJ/IV SOLN (WRAP)
Status: DC | PRN
Start: 2016-11-10 — End: 2016-11-10
  Administered 2016-11-10: 5 mg via INTRAVENOUS
  Administered 2016-11-10: 10 mg via INTRAVENOUS
  Administered 2016-11-10: 5 mg via INTRAVENOUS
  Administered 2016-11-10: 10 mg via INTRAVENOUS

## 2016-11-10 MED ORDER — BACITRACIN 50000 UNITS IM SOLR
INTRAMUSCULAR | Status: AC
Start: 2016-11-10 — End: 2016-11-10
  Administered 2016-11-10: 50000 [IU]
  Filled 2016-11-10: qty 50000

## 2016-11-10 MED ORDER — SODIUM CHLORIDE 0.9 % IV SOLN
INTRAVENOUS | Status: DC | PRN
Start: 2016-11-10 — End: 2016-11-10

## 2016-11-10 MED ORDER — ASPIRIN 81 MG PO CHEW
81.0000 mg | CHEWABLE_TABLET | Freq: Every day | ORAL | Status: DC
Start: 2016-11-10 — End: 2016-11-11
  Administered 2016-11-10 – 2016-11-11 (×2): 81 mg via ORAL
  Filled 2016-11-10 (×2): qty 1

## 2016-11-10 MED ORDER — ALBUTEROL SULFATE (2.5 MG/3ML) 0.083% IN NEBU
2.5000 mg | INHALATION_SOLUTION | Freq: Once | RESPIRATORY_TRACT | Status: AC
Start: 2016-11-10 — End: 2016-11-10

## 2016-11-10 MED ORDER — SPIRONOLACTONE 25 MG PO TABS
25.0000 mg | ORAL_TABLET | Freq: Every day | ORAL | Status: DC
Start: 2016-11-10 — End: 2016-11-11
  Administered 2016-11-10 – 2016-11-11 (×2): 25 mg via ORAL
  Filled 2016-11-10 (×2): qty 1

## 2016-11-10 MED ORDER — VALSARTAN 160 MG PO TABS
320.0000 mg | ORAL_TABLET | Freq: Every day | ORAL | Status: DC
Start: 2016-11-10 — End: 2016-11-11
  Administered 2016-11-10 – 2016-11-11 (×2): 320 mg via ORAL
  Filled 2016-11-10 (×2): qty 2

## 2016-11-10 MED ORDER — CEFAZOLIN SODIUM 1 G IJ SOLR
INTRAMUSCULAR | Status: DC | PRN
Start: 2016-11-10 — End: 2016-11-10
  Administered 2016-11-10: 2 g via INTRAVENOUS

## 2016-11-10 MED ORDER — FAMOTIDINE 20 MG PO TABS
20.0000 mg | ORAL_TABLET | Freq: Two times a day (BID) | ORAL | Status: DC
Start: 2016-11-10 — End: 2016-11-11
  Administered 2016-11-10 – 2016-11-11 (×3): 20 mg via ORAL
  Filled 2016-11-10 (×3): qty 1

## 2016-11-10 MED ORDER — CARVEDILOL 12.5 MG PO TABS
25.0000 mg | ORAL_TABLET | Freq: Two times a day (BID) | ORAL | Status: DC
Start: 2016-11-10 — End: 2016-11-11
  Administered 2016-11-10 – 2016-11-11 (×2): 25 mg via ORAL
  Filled 2016-11-10 (×2): qty 2

## 2016-11-10 MED ORDER — ONDANSETRON HCL 4 MG/2ML IJ SOLN
4.0000 mg | Freq: Every day | INTRAMUSCULAR | Status: DC | PRN
Start: 2016-11-10 — End: 2016-11-11

## 2016-11-10 MED ORDER — LIDOCAINE HCL (PF) 1 % IJ SOLN
INTRAMUSCULAR | Status: AC
Start: 2016-11-10 — End: 2016-11-10
  Administered 2016-11-10: 15 mg via SUBCONJUNCTIVAL
  Filled 2016-11-10: qty 30

## 2016-11-10 MED ORDER — ALBUTEROL SULFATE (2.5 MG/3ML) 0.083% IN NEBU
2.5000 mg | INHALATION_SOLUTION | Freq: Four times a day (QID) | RESPIRATORY_TRACT | Status: DC
Start: 2016-11-10 — End: 2016-11-10

## 2016-11-10 MED ORDER — ACETAMINOPHEN-CODEINE #3 300-30 MG PO TABS
1.0000 | ORAL_TABLET | Freq: Four times a day (QID) | ORAL | Status: DC | PRN
Start: 2016-11-10 — End: 2016-11-11
  Administered 2016-11-10 – 2016-11-11 (×2): 1 via ORAL
  Filled 2016-11-10 (×2): qty 1

## 2016-11-10 MED ORDER — GLUCAGON 1 MG IJ SOLR (WRAP)
1.0000 mg | INTRAMUSCULAR | Status: DC | PRN
Start: 2016-11-10 — End: 2016-11-11

## 2016-11-10 MED ORDER — DEXTROSE 50 % IV SOLN
12.5000 g | INTRAVENOUS | Status: DC | PRN
Start: 2016-11-10 — End: 2016-11-11

## 2016-11-10 MED ORDER — NIACIN ER (ANTIHYPERLIPIDEMIC) 500 MG PO TBCR
500.0000 mg | EXTENDED_RELEASE_TABLET | Freq: Every evening | ORAL | Status: DC
Start: 2016-11-10 — End: 2016-11-11
  Administered 2016-11-10: 500 mg via ORAL
  Filled 2016-11-10 (×3): qty 1

## 2016-11-10 MED ORDER — CEFAZOLIN 1 GM MBP (CNR)
Status: AC
Start: 2016-11-10 — End: 2016-11-10
  Filled 2016-11-10: qty 200

## 2016-11-10 MED ORDER — AMLODIPINE BESYLATE 5 MG PO TABS
10.0000 mg | ORAL_TABLET | Freq: Every day | ORAL | Status: DC
Start: 2016-11-10 — End: 2016-11-11
  Administered 2016-11-10 – 2016-11-11 (×2): 10 mg via ORAL
  Filled 2016-11-10 (×2): qty 2

## 2016-11-10 MED ORDER — PROPOFOL 10 MG/ML IV EMUL (WRAP)
INTRAVENOUS | Status: AC
Start: 2016-11-10 — End: 2016-11-10
  Filled 2016-11-10: qty 20

## 2016-11-10 NOTE — Anesthesia Preprocedure Evaluation (Signed)
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 Reportedly pronounced improving by PCP Dr. Altamease Oiler. Some expiratory wheezes on auscultation.               Anesthesia Plan    ASA 3     general               (Albuterol nebulizer to be administered prior to induction of anesthesia)              Post op pain management: per surgeon    informed consent obtained                   Signed by: Verdis Prime 11/10/16 8:19 AM

## 2016-11-10 NOTE — Plan of Care (Signed)
Pain Management Plan    Education about your Pain Management.    Dear [name] [name],    It is my pleasure to care for you during your hospitalization here at [facility]. We have initiated a new program to educate our patients and/or your family members about your pain management plan.    At [facility], we are committed to providing patients the best patient care possible.  We are seeking every opportunity to meet your unique needs.     Pain is the most common medical problem that requires urgent attention.We understand how difficult it is to have pain and will work with you and your physicians to develop an effective pain management solution.    You are experiencing pain due to your defibrillator placement  and our goal is to control your pain to a tolerable level where you are able to perform activities of daily living.    This is what you have identified that helps you manage pain at home:  Not applicable    We will administer medications to control your pain as prescribed by your provider.  We will offer to provide other comfort measures as needed. We will follow up frequently to assess your pain and the effectiveness of your  pain management plan. We will communicate with your Physicians frequently to change the medications if needed.    Thank you for your time.    Rosemary Holms, RN  11/10/2016  2:45 PM  [facility]  [address]  [place], [place]  [address]

## 2016-11-10 NOTE — Brief Op Note (Signed)
[facility] EP PROCEDURE NOTE    Date Time: 11/10/16 11:13 AM    Patient Name:   [name] [name]    Date of Procedure:   11/10/2016    Provider(s) Performing:   Surgeon(s):  Shelbie Ammons, MD    Assistant (s):   * No surgical staff found *    Procedure Title:   Procedures:  BiV ICD Implantation  ICD Generator Removal    Preoperative Diagnosis:   1) ICD Malfunction, 2) CM, 3) CHF, 4) ICD Battery Depletion    Postoperative Diagnosis:   1) Chronic ICD Lead Dislodgement, 2) CM, 3) CHF, 4) ICD Battery Depletion      Anesthesia:   GA    Vascular Access:   Left SCV    Findings:   Fluoroscopy revealed that the chronic ICD lead had dislodged and the tip was all the way up in the superior vena cava. This lead was attempted to be extracted, but it was too adherent to the SVC from scarring so the lead was cut in the pocket and capped.  A new BiV ICD system was implanted. Good lead parameters.  Settings: VF 200, VT monitor 171 bpm, DDDR 60-130 bpm    Estimated Blood Loss:   10-20 mL    Complications:   None    Recommendations:   1) 24 hrs IV Abx, 2) Home on Ceftin 500 mg po bid x7 days    Signed by: Shelbie Ammons, MD                                                                           [facility] CATH/EP

## 2016-11-10 NOTE — Transfer of Care (Signed)
Anesthesia Transfer of Care Note    Patient: [name] [name]    Procedures performed: Procedure(s):  ICD Ugrade to ICD BiV  ICD Pocket Revision-(LEAD REVISION)    Anesthesia type: General LMA    Patient location:Radiology PACU    Last vitals:   Vitals:    11/10/16 1134   BP: 123/89   Pulse: 78   Resp: 18   Temp: (!) 35.9 C (96.6 F)   SpO2: 98%       Post pain: Patient not complaining of pain, continue current therapy      Mental Status:alert     Respiratory Function: tolerating nasal cannula    Cardiovascular: stable    Nausea/Vomiting: patient not complaining of nausea or vomiting    Hydration Status: adequate    Post assessment: no apparent anesthetic complications    Signed by: Jill Side  11/10/16 11:34 AM

## 2016-11-10 NOTE — Progress Notes (Signed)
Admitted from heart and vascular Dept via stretcher accompanied by transporter. Oriented to the unit and plan of care.

## 2016-11-10 NOTE — Plan of Care (Signed)
Problem: Hemodynamic Status: Cardiac  Goal: Stable vital signs and fluid balance  Able to use the call light for assistance when needed    Problem: Impaired Mobility  Goal: Mobility/Activity is maintained at optimal level for patient  V paced. Bedrest until 14:20 Able to move all extremities. Left arm sling on.

## 2016-11-10 NOTE — Anesthesia Postprocedure Evaluation (Addendum)
Anesthesia Post Evaluation    Patient: [name] [name]    Procedure(s):  ICD Ugrade to ICD BiV  ICD Pocket Revision-(LEAD REVISION)    Anesthesia type: General LMA    Last Vitals:   Vitals:    11/10/16 1134   BP: 123/89   Pulse: 78   Resp: 18   Temp: (!) 35.9 C (96.6 F)   SpO2: 98%         Patient Location: Radiology PACU    Post Pain: Patient not complaining of pain, continue current therapy    Mental Status: alert    Respiratory Function: tolerating nasal cannula    Cardiovascular: stable    Nausea/Vomiting: patient not complaining of nausea or vomiting    Hydration Status: adequate    Post Assessment: no apparent anesthetic complications, no reportable events and no evidence of recall          Anesthesia Qualified Clinical Data Registry 2018    PACU Reintubation  Did the Patient have general anesthesia with intubation: No        PONV Adult  Is the patient aged 70 or older: Yes  Did the patient receive recieve a general anesthestic: Yes  Does the patient have 3 or more risk factors for PONV? No        PONV Pediatric  Is the patient aged 6-17? No            PACU Transfer Checklist Protocol  Was the patient transferred to the PACU at the conclusion of surgery? Yes  Was a checklist or transfer protocol used? Yes    ICU Transfer Checklist Protocol  Was the patient transferred to the ICU at the conclusion of surgery? No      Post-op Pain Assessment Prior to Anesthesia Care End  Age >=18 and assessed for pain in PACU: Yes  Pacu pain score <7/10: Yes      Perioperative Mortality  Perioperative mortality prior to Anesthesia end time: No    Perioperative Cardiac Arrest  Did the patient have an unanticipated intraoperative cardiac arrest between anesthesia start time and anesthesia end time? No    Unplanned Admission to ICU  Did the patient have an unplanned admission to the ICU (not initially anticipated at anesthesia start time)? No      Signed by: Jill Side, 11/10/2016 11:35 AM
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
## 2016-11-10 NOTE — Plan of Care (Signed)
Problem: Safety  Goal: Patient will be free from injury during hospitalization  Outcome: Progressing  Call bell within reach   11/10/16 2030   Goal/Interventions addressed this shift   Patient will be free from injury during hospitalization  Assess patient's risk for falls and implement fall prevention plan of care per policy;Provide and maintain safe environment;Use appropriate transfer methods;Ensure appropriate safety devices are available at the bedside;Include patient/ family/ care giver in decisions related to safety;Hourly rounding       Problem: Pain  Goal: Pain at adequate level as identified by patient  Outcome: Progressing  Pt denies any pain or discomfort at this time,sling kept on left arm,will monitor   11/10/16 2030   Goal/Interventions addressed this shift   Pain at adequate level as identified by patient Identify patient comfort function goal;Assess for risk of opioid induced respiratory depression, including snoring/sleep apnea. Alert healthcare team of risk factors identified.;Assess pain on admission, during daily assessment and/or before any "as needed" intervention(s);Reassess pain within 30-60 minutes of any procedure/intervention, per Pain Assessment, Intervention, Reassessment (AIR) Cycle;Evaluate if patient comfort function goal is met;Offer non-pharmacological pain management interventions;Include patient/patient care companion in decisions related to pain management as needed

## 2016-11-10 NOTE — Plan of Care (Signed)
Ask3Teach3 Program    Education about New Medications and their Side effects    Dear [name] [name],    Its been a pleasure taking care of you during your hospitalization here at [facility]. We have initiated a new program to educate our patients and/or their family members or designated personnel about the new medications started by your physicians and their indications along with the possible side effects. Multiple studies have shown that patients started on new medications are often unaware of the names of the medication along with the indications and their side effects which leads to decreased compliance with the medications.    During our conversation today on 11/10/2016  3:52 PM I have explained to you the name of the new medication and the indication along with some possible common side effects. Listed below are some of the new medications started during this hospitalization.     Please call the Nurse if you have any side effects while in hospital.     Please call 911 if you have any life threatening symptoms after you are discharged from the hospital.    Please inform your Primary care physician for common side effects which are not life threatening after discharge.    Medication:Albuterol(Ventolin/Proventil)   This Medication is used for:   COPD   Asthma   Bronchospasm    Common Side Effects are:   Headache   Tremor   Nervousness   Sleeplessness    A note from your nurse:  Call your nurse immediately if you notice itching, hives, swelling or trouble breathing       Thank you for your time.    Alice Reichert, RT  11/10/2016  3:52 PM  [facility]  [address]  [place], [place]  [address]

## 2016-11-10 NOTE — Plan of Care (Signed)
Problem: Hemodynamic Status: Cardiac  Goal: Stable vital signs and fluid balance  Outcome: Progressing   11/10/16 2030   Goal/Interventions addressed this shift   Stable vital signs and fluid balance Monitor/assess vital signs and telemetry per unit protocol;Weigh on admission and record weight daily;Assess signs and symptoms associated with cardiac rhythm changes;Monitor intake/output per unit protocol and/or LIP order;Monitor for leg swelling/edema and report to LIP if abnormal;Monitor lab values

## 2016-11-10 NOTE — UM Notes (Signed)
[facility] Utilization Review  NPI # [id number]  Please call Latwan Luchsinger at [phone] with any questions or concerns.   Fax final authorization and request for additional information to [phone].    Planned procedures i42.9 [phone] [phone] [id number]    [facility] EP PROCEDURE NOTE    Date Time: 11/10/16 11:13 AM    Patient Name:   [name] [name]    Date of Procedure:   11/10/2016    Provider(s) Performing:   Surgeon(s):  Shelbie Ammons, MD    Assistant (s):   * No surgical staff found *    Procedure Title:   Procedures:  BiV ICD Implantation  ICD Generator Removal    Preoperative Diagnosis:   1) ICD Malfunction, 2) CM, 3) CHF, 4) ICD Battery Depletion    Postoperative Diagnosis:   1) Chronic ICD Lead Dislodgement, 2) CM, 3) CHF, 4) ICD Battery Depletion      Anesthesia:   GA    Vascular Access:   Left SCV    Findings:   Fluoroscopy revealed that the chronic ICD lead had dislodged and the tip was all the way up in the superior vena cava. This lead was attempted to be extracted, but it was too adherent to the SVC from scarring so the lead was cut in the pocket and capped.  A new BiV ICD system was implanted. Good lead parameters.  Settings: VF 200, VT monitor 171 bpm, DDDR 60-130 bpm    Estimated Blood Loss:   10-20 mL    Complications:   None    Recommendations:   1) 24 hrs IV Abx, 2) Home on Ceftin 500 mg po bid x7 days

## 2016-11-11 LAB — ECG 12-LEAD
Atrial Rate: 81 {beats}/min
P Axis: 70 degrees
P-R Interval: 142 ms
Q-T Interval: 362 ms
QRS Duration: 86 ms
QTC Calculation (Bezet): 420 ms
R Axis: 7 degrees
T Axis: 106 degrees
Ventricular Rate: 81 {beats}/min

## 2016-11-11 LAB — GLUCOSE WHOLE BLOOD - POCT: Whole Blood Glucose POCT: 184 mg/dL — ABNORMAL HIGH (ref 70–100)

## 2016-11-11 MED ORDER — ACETAMINOPHEN-CODEINE #3 300-30 MG PO TABS
1.0000 | ORAL_TABLET | Freq: Four times a day (QID) | ORAL | 0 refills | Status: DC | PRN
Start: 2016-11-11 — End: 2016-12-14

## 2016-11-11 MED ORDER — CEFUROXIME AXETIL 500 MG PO TABS
500.0000 mg | ORAL_TABLET | Freq: Two times a day (BID) | ORAL | 0 refills | Status: AC
Start: 2016-11-11 — End: 2016-11-18

## 2016-11-11 MED ORDER — ACETAMINOPHEN 325 MG PO TABS
650.0000 mg | ORAL_TABLET | Freq: Four times a day (QID) | ORAL | Status: DC | PRN
Start: 2016-11-11 — End: 2016-12-14

## 2016-11-11 NOTE — Plan of Care (Signed)
Problem: Safety  Goal: Patient will be free from injury during hospitalization  Outcome: Progressing   11/11/16 0821   Goal/Interventions addressed this shift   Patient will be free from injury during hospitalization  Assess patient's risk for falls and implement fall prevention plan of care per policy;Provide and maintain safe environment;Use appropriate transfer methods;Ensure appropriate safety devices are available at the bedside;Include patient/ family/ care giver in decisions related to safety;Hourly rounding;Assess for patients risk for elopement and implement Elopement Risk Plan per policy;Provide alternative method of communication if needed (communication boards, writing)     Patient alert and oriented x4 with good judgement and safety awareness. Bed in lowest position and room is clean and clutter free. Call bell in reach and hourly rounding ongoing. Will continue to monitor      Problem: Pain  Goal: Pain at adequate level as identified by patient  Outcome: Progressing   11/11/16 0821   Goal/Interventions addressed this shift   Pain at adequate level as identified by patient Identify patient comfort function goal;Assess pain on admission, during daily assessment and/or before any "as needed" intervention(s);Assess for risk of opioid induced respiratory depression, including snoring/sleep apnea. Alert healthcare team of risk factors identified.;Reassess pain within 30-60 minutes of any procedure/intervention, per Pain Assessment, Intervention, Reassessment (AIR) Cycle;Evaluate if patient comfort function goal is met;Evaluate patient's satisfaction with pain management progress;Offer non-pharmacological pain management interventions     Patient denies pain at this time. Will monitor and help manage.       Problem: Compromised Tissue integrity  Goal: Damaged tissue is healing and protected  Outcome: Progressing   11/11/16 0821   Goal/Interventions addressed this shift   Damaged tissue is healing and  protected  Monitor/assess Braden scale every shift;Keep intact skin clean and dry     Incision site clean, dry and intact. Will monitor.

## 2016-11-11 NOTE — Discharge Instructions (Signed)
Discharge Instructions for Implantable Cardioverter-Defibrillator (ICD)  You had a procedure to insert an implantable cardioverter-defibrillator (ICD). Once inside your body, an ICD monitors your heart rhythm (the speed and pattern of your heartbeat). If this rhythm becomes too fast or too slow, the ICD sends out electrical signals. These help bring the rhythm back to normal.As you recover, follow the instructions below. Also follow any other directions you're given.  Activity   Don't drive until your doctor says it's OK. It is recommended that you avoid driving for 6 months after a defibrillator is implanted or if the device fires. The life threatening heart rhythms these devices treat can cause you to lose consciousness, which would be very dangerous if you are driving.   Limit your activity as instructed.   If you are fitted with an arm sling, keep your arm in the sling for as long as your doctor tells you to. However, make sure that your arm is not completely immobilized for more than a week. This can lead to a stiff shoulder.   Do not raise your arm on the incision side above shoulder level or stretch the arm behind your back for as long as your doctor recommends. This gives the device lead wires time to attach securely inside your heart.   Ask your doctor when you can expect to return to work and if you will have any restrictions in your work duties for any period of time. 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  Other precautions   Every day, take your temperature and check your incision for signs of infection (redness, swelling, drainage, or warmth). Do this for7days.   Take your medicines exactly as directed. Don't skip doses or stop medicines without discussing this first with your doctor. Tell your doctor if you are having any new symptoms that might be a side effect.   Carry an ID  card that contains information about your ICD. You should have been given a temporary ID card with information about your ICD on it. You will get a permanent onein 4 to 6 weeks. Carry this card with you.You can show this card if your ICD sets off a metal detector. You should also show it to avoid screening with a hand-held security wand.   Before you receive any treatment, tell all healthcare providers (including your dentist) that you have an ICD.   Keep your cell phone away from your ICD. Don'tcarry your phone in your shirt pocket, even when it's turned off.   Avoid strong magnets. Examples are those used in MRIs or in hand-held security wands.   Avoid strong electrical fields. Examples are those made by radio transmitting towers,"ham"radios, and heavy-duty electrical equipment.   Avoid leaning over the open hood of a running car. A running engine creates an electrical field. Other than your car, most items around the house, such as your microwave, are perfectly safe. Most common yard work equipment, such as your lawn mower, are safe. If you use commercial-grade tools, such as an arc welder, check with your doctor for recommendations.   Make regular appointments with your doctor. He or she will check the device to make sure it continues to work properly.  Follow-up care  Follow up with your healthcare provider, or as advised.  Ask your doctor about remote monitoring of your ICD. Your ICD may have a remote monitoring   system that can transmit information over the phone or internet to your doctor.  If you are not able to have your device monitored remotely, you will have periodic checkups in your cardiologist's office to check the function and battery life of your ICD. On average, plan to have your device checked every 6 months. The generator battery can last as long as 5 to 7 years.  When to call your healthcare provider  Call your healthcare provider right away if you have any of these:   A "shock"  sensation from your ICD. This may feel like being kicked in the chest.   Fever above100.4F (38C)   Signs of infection at your incision site (redness, swelling, drainage, or warmth)   Twitching in your chest or abdominal muscles   Increased pain around your ICD   Bleeding atthe incision site   Arm swelling on the side of the incision   Uncontrolled hiccups   Date Last Reviewed: 02/17/2015   2000-2017 [employer] [address], [place] [address]. All rights reserved. This information is not intended as a substitute for professional medical care. Always follow your healthcare professional's instructions.

## 2016-11-11 NOTE — Progress Notes (Signed)
Patient discharged to home in stable condition. Discharge instructions reviewed with patient and his wife at the bedside. Discussed medication list, including new medications- why, how, and when to take them in addition to possible side effects. Patient verbalizes understanding. We also talked about how and when he should follow up. Again, he understands and agrees to the plan.     IV removed with catheter intact. Telemetry discontinued.     All personal belongings with patient at time of discharge. He was wheel-chaired to lobby and will drive home with wife.

## 2016-11-11 NOTE — Plan of Care (Signed)
Pt being discharged home, status post AICD placement, tolerating well, instructions given

## 2016-11-11 NOTE — Discharge Summary (Addendum)
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   ICD lead fracture   Chronic systolic congestive heart failure with NYHA Functional Class II-III symptoms.   Recurrent syncope, but the etiology is unclear since the diagnostics of his device had been cleared since the events in March 2017.   Hypertension.   Diabetes mellitus.   Coronary artery disease with prior cardiac catheterization in June 2011 showing a 70% to 80% distal LAD lesion at the apex with otherwise no significant obstructive disease.    Hospital course:   Pt was admitted via EP lab s/p successful upgrade to Medtronic BIV ICD on 11/10/16.  Original RV ICD lead had dislodged and was scared into the wall of the SVC so an entire new system was placed.  Pt tolerated procedure well without complication.  Post procedure CXR 11/10/16 shows no PTX, R basilar scarring with bibasilar atelectasis.  Post procedure device interrogation 11/11/16 shows NL lead parameters, NL function.      Pts pain is well controlled with tylenol #3, he will be given an Rx for this 20 tabs, no refills.      Home on 7 days of BID ancef (home augmentin course has been completed)       Procedures performed:   BIV ICD upgrade (medtronic) 11/10/16    Discharge Instructions:   ceftin 500 BID x 7 days  Outpatient augmentin course has been completed  Tylenol #3 for pain (20 tabs)   No change in PTA meds  Site check in 7-10  days with device check in 4-6 weeks     Physical Exam:     Vitals:    11/11/16 0549   BP: 119/73   Pulse: 62   Resp: 16   Temp: (!) 96.8 F (36 C)   SpO2: 97%     Temp (24hrs), Avg:97.4 F (36.3 C), Min:96.6 F (35.9 C), Max:98.6 F (37 C)      Telemetry reviewed no changes.   SR, V paced, rare couplets     Intake and Output Summary (Last 24 hours) at Date Time    Intake/Output Summary (Last 24 hours) at 11/11/16 0840  Last data filed at 11/11/16 0600   Gross per 24 hour   Intake             1450 ml   Output              940 ml   Net              510 ml       General Appearance:  Breathing comfortable, no acute distress  Neck:  No carotid bruit or jugular venous distension, brisk carotid upstroke  Chest Wall: incision is C/D/I, mild edema of the device pocket, it is soft, no evidence of hematoma.  No erythema, minimal ecchymosis   Lungs:  Clear to auscultation throughout, no wheezes, rhonchi  or rales, good respiratory effort   Heart:  S1, S2 normal, no S3, no S4, no murmur, PMI not displaced, no rub   Abdomen:  Soft, non-tender, positive bowel sounds, no hepatojugular reflux  Extremities:  No cyanosis, clubbing or edema  Pulses:  Equal pulses, 4/4 symmetric  Neurologic:  Alert and oriented x3, mood and affect normal        Discharge Medications:     Current Discharge Medication List      START taking these medications    Details   acetaminophen (TYLENOL) 325 MG tablet Take 2 tablets (650 mg total) by mouth every 6 (six) hours as needed for Pain.      acetaminophen-codeine (TYLENOL #3) 300-30 MG per tablet Take 1-2 tablets by mouth every 6 (six) hours as needed for Pain.  Qty: 20 tablet, Refills: 0      cefuroxime (CEFTIN) 500 MG tablet Take 1 tablet (500 mg total) by mouth 2 (two) times daily.for 7 days  Qty: 14 tablet, Refills: 0         CONTINUE these medications which have NOT CHANGED    Details   albuterol (PROVENTIL HFA;VENTOLIN HFA) 108 (90 Base) MCG/ACT inhaler Inhale 2 puffs into the lungs every 4 (four)  hours as needed for Wheezing.  Qty: 1 Inhaler, Refills: 0      amlodipine-valsartan (EXFORGE) 10-320 MG per tablet TAKE 1 TABLET BY MOUTH DAILY.  Qty: 90 tablet, Refills: 2      aspirin 81 MG chewable tablet Chew 1 tablet (81 mg total) by mouth daily.  Refills: 0      atorvastatin (LIPITOR) 40 MG tablet Take 40 mg by mouth daily.      carvedilol (COREG) 25 MG tablet TAKE 1 TABLET BY MOUTH TWICE A DAY WITH MEALS  Qty: 60 tablet, Refills: 0      famotidine (PEPCID) 20 MG tablet TAKE 1 TABLET BY MOUTH TWICE A DAY  Qty: 60 tablet, Refills: 0      guaiFENesin (MUCINEX) 600 MG 12 hr tablet Take 1 tablet (600 mg total) by mouth every 12 (twelve) hours.  Qty: 20 tablet, Refills: 0      insulin glargine (LANTUS SOLOSTAR) 100 UNIT/ML injection pen 50 units SQ at HS per pt  Qty: 15 pen, Refills: 3      niacin (NIASPAN) 500 MG CR tablet TAKE 1 TABLET BY MOUTH EVERY DAY AT BEDTIME  Refills: 3      SITagliptin-metFORMIN (JANUMET) 50-1000 MG per tablet Take 1 tablet by mouth 2 (two) times daily with meals.  Qty: 180 tablet, Refills: 3    Associated Diagnoses: Type 2 diabetes mellitus without complication, without long-term current use of insulin      spironolactone (ALDACTONE) 25 MG tablet Take 25 mg by mouth daily.      zolpidem (AMBIEN) 10 MG tablet Take 10 mg by mouth nightly as needed for Sleep.      glucose blood (ONE TOUCH ULTRA TEST) test strip 1 each by Other route 2 (two) times daily.Use as instructed  Qty: 300 each, Refills: 1    Associated Diagnoses: Diabetes mellitus due to underlying condition with hyperosmolarity without coma, without long-term current use of insulin         STOP taking these medications       amoxicillin-clavulanate (AUGMENTIN) 875-125 MG per tablet                Signed by: Amado Nash, PA-C    .  Patient seen and examined, my assessment and plan as above.

## 2016-11-11 NOTE — Discharge Instr - AVS First Page (Signed)
Reason for your Hospital Admission:  For ICD revision and upgrade to BIV device      Instructions for after your discharge:         [facility]   [facility]    Discharge Instructions  after Pacemaker or Defibrillator Insertion    Before you leave the hospital, be sure you have your new device's Temporary ID Card and Information Booklet.  Ask your nurse if you do not have them. You will receive the Permanent ID Card through the mail within 4-6 weeks. Carry your device ID Card with you at all times.     If the large bandage has not been removed, it should be removed when you arrive home.  Do not remove the steri-strips (strips of tape) on the incision.  No new bandages should be placed over the site; it should be left open to air.     If you have received a Medtronic Device, either Pacemaker or Defibrillator a remote monitoring box will automatically be sent to you, via mail.  The remote monitor may arrive prior to your first follow up appointment with [facility].  All questions regarding remote monitoring will be addressed at the first follow up appointment.    Restrictions:   . Do not raise elbow on the affected side of your body over your shoulder, for at least 4 weeks.  Marland Kitchen No swimming, golfing, or exercises that require extensive arm range of motion, or lifting more than 10 lbs with the affected side for at least 4 weeks.  . You are advised not to drive for 48 hours after anesthesia, or while on narcotic pain meds  . Avoid creams, ointments, etc. on the skin at the wound site.   . If you have Dermabond (surgical glue), you may occasionally and briefly wet the site.  However, DO NOT wet with a direct stream of water, soak or scrub.  Do not scratch, rub or pull at the glue.      Follow-Up Appointment:   When you arrive home, call your [facility] cardiologist at [phone], to schedule a visit for a "site-check" in 7 to 14 days after your procedure.  You will see a nurse at this visit.       Medications:   []  Tylenol for pain, if needed         []  Resume all medications     [x]  See separate hospital discharge medications   []  New Medication(s)      Call Our Office For:  Please note the symptoms that you should call the office for immediately:  . Pain, redness, swelling, drainage or bleeding at the wound site.  . Significant chest discomfort, shortness of breath, dizziness, or lightheadedness.  . Fever greater than 100 degrees.           In addition, please call the [facility] at [phone], if you have questions or concerns about your device or procedure.     Please log on to our website at [email or web address] to learn more about your conditions, tests, treatments, and our practice.   Practice code: [email or web address]

## 2016-11-17 ENCOUNTER — Other Ambulatory Visit (INDEPENDENT_AMBULATORY_CARE_PROVIDER_SITE_OTHER): Payer: Self-pay | Admitting: Family Medicine

## 2016-11-18 ENCOUNTER — Other Ambulatory Visit (INDEPENDENT_AMBULATORY_CARE_PROVIDER_SITE_OTHER): Payer: Self-pay | Admitting: Family Medicine

## 2016-11-18 ENCOUNTER — Telehealth (INDEPENDENT_AMBULATORY_CARE_PROVIDER_SITE_OTHER): Payer: Self-pay | Admitting: Family Medicine

## 2016-11-18 NOTE — Telephone Encounter (Signed)
I dont see this medication in his medication list . ??

## 2016-11-18 NOTE — Telephone Encounter (Signed)
Name Dose/Strength (i.e. mg) Directions (how patient is taking medication currently - i.e. One tab daily) 30 or 90 day supply requested?   Medication 1 hydrocodone 5 ml     Medication 2       Medication 3       Medication 4       Medication 5       Medication 6         Prescription Management:  Local pharmacy: [facility] at [address]    Please mark "X" next to the preferred call back number:  Mobile:   Telephone Information:   Mobile [phone]       Home: [phone]    Work:       Last visit: 11/17/2016     Next visit: Visit date not found   Pt is requesting refill on the above med.Please have nurse call pt if there is any issue with renewing it.Pt wants it soon.  Thanks

## 2016-11-22 ENCOUNTER — Other Ambulatory Visit (INDEPENDENT_AMBULATORY_CARE_PROVIDER_SITE_OTHER): Payer: Self-pay | Admitting: Family Medicine

## 2016-11-22 DIAGNOSIS — E119 Type 2 diabetes mellitus without complications: Secondary | ICD-10-CM

## 2016-11-24 NOTE — Telephone Encounter (Signed)
Ok to call in one refill , considering his age.,

## 2016-11-24 NOTE — Telephone Encounter (Signed)
Spoke with patient and he states that he is requesting a refill of Tussinex. He states that he was given an rx for this by you back in December for cough and that cough is still present. He was being given this when he was in the hospital for pneumonia recently as well.     Advised patient that since this is a controlled substance and this cough has been present now and persisting for over 1 month, you may want to see him before refilling. He understands, but would like to see if you will refill without an appointment first.

## 2016-11-25 ENCOUNTER — Ambulatory Visit (INDEPENDENT_AMBULATORY_CARE_PROVIDER_SITE_OTHER): Payer: Self-pay

## 2016-11-30 ENCOUNTER — Ambulatory Visit (INDEPENDENT_AMBULATORY_CARE_PROVIDER_SITE_OTHER): Payer: Self-pay | Admitting: Cardiovascular Disease

## 2016-11-30 ENCOUNTER — Other Ambulatory Visit (INDEPENDENT_AMBULATORY_CARE_PROVIDER_SITE_OTHER): Payer: Self-pay | Admitting: Family Medicine

## 2016-11-30 ENCOUNTER — Ambulatory Visit (INDEPENDENT_AMBULATORY_CARE_PROVIDER_SITE_OTHER): Payer: Commercial Managed Care - POS

## 2016-11-30 DIAGNOSIS — R059 Cough, unspecified: Secondary | ICD-10-CM

## 2016-11-30 DIAGNOSIS — Z23 Encounter for immunization: Secondary | ICD-10-CM

## 2016-11-30 NOTE — Progress Notes (Signed)
Patient presents to office for flu vaccine.     High dose flu shot given as patient is 71 years old.     Patient tolerated injection well. bandaid applied to site.

## 2016-11-30 NOTE — Telephone Encounter (Signed)
Notified patient.

## 2016-12-02 ENCOUNTER — Encounter (INDEPENDENT_AMBULATORY_CARE_PROVIDER_SITE_OTHER): Payer: Self-pay | Admitting: Family Medicine

## 2016-12-02 ENCOUNTER — Ambulatory Visit (INDEPENDENT_AMBULATORY_CARE_PROVIDER_SITE_OTHER): Payer: Commercial Managed Care - POS | Admitting: Family Medicine

## 2016-12-02 DIAGNOSIS — R059 Cough, unspecified: Secondary | ICD-10-CM | POA: Insufficient documentation

## 2016-12-02 DIAGNOSIS — R05 Cough: Secondary | ICD-10-CM

## 2016-12-02 MED ORDER — HYDROCOD POLST-CPM POLST ER 10-8 MG/5ML PO SUER
5.0000 mL | Freq: Two times a day (BID) | ORAL | 0 refills | Status: DC
Start: 2016-12-02 — End: 2017-01-22

## 2016-12-02 NOTE — Patient Instructions (Signed)
Recommend to continue to push exercise and stay on a good diet . Try to get to the perfect BMI of 25. Start your day with a high fiber diet and  involve the family in your exercise regiment .

## 2016-12-02 NOTE — Progress Notes (Signed)
Subjective:      Date: 12/02/2016 1:56 PM   Patient ID: [name] [name] is a 71 y.o. male.    Chief Complaint:  Chief Complaint   Patient presents with   . Hypertension     medication questions concerning cough        HPI:  Patient reports recurrent cough. Patient is seeing a cardiologist, and vascular doctor. Patient states Dr Eliseo Gum is requesting patient to take blood pressure medication, HTCZ, but need approval from primary care physician. Patient was recently seen at [facility] ED for recent bronchitis and pneumonia.     Patient reports cough since 09/2016. Symptoms include productive cough, wheezing. Symptoms are aggravated when laying down. Symptoms are unchanged since onset.  Patient has tried mucinex and tussinex with moderate relief, patient also tried albuterol neb treatment which helps with relief, patient has a nebulizer machine at home. Patient is requesting to have chest xray done.     Patient has lots of night sweats .    Problem List:  Patient Active Problem List   Diagnosis   . Chest pain   . Diabetes mellitus   . Heart attack   . CAD (coronary artery disease)   . Non-ischemic cardiomyopathy   . Acute exacerbation of CHF (congestive heart failure)   . PUD (peptic ulcer disease)   . Elevated LFTs   . Acute dyspnea   . Elevated d-dimer   . CHF (congestive heart failure)   . Headaches due to old head injury   . Essential hypertension   . Pneumonia due to infectious organism, unspecified laterality, unspecified part of lung   . Syncope and collapse   . AICD (automatic cardioverter/defibrillator) present   . Syncope, unspecified syncope type   . Pneumonia due to infectious organism   . Dyspnea, unspecified type   . Cardiomyopathy       Current Medications:  Current Outpatient Prescriptions   Medication Sig Dispense Refill   . acetaminophen (TYLENOL) 325 MG tablet Take 2 tablets (650 mg total) by mouth every 6 (six) hours as needed for Pain.     Marland Kitchen acetaminophen-codeine (TYLENOL #3) 300-30 MG per tablet  Take 1-2 tablets by mouth every 6 (six) hours as needed for Pain. 20 tablet 0   . amlodipine-valsartan (EXFORGE) 10-320 MG per tablet TAKE 1 TABLET BY MOUTH DAILY. 90 tablet 2   . aspirin 81 MG chewable tablet Chew 1 tablet (81 mg total) by mouth daily.  0   . atorvastatin (LIPITOR) 40 MG tablet Take 40 mg by mouth daily.     . carvedilol (COREG) 25 MG tablet TAKE 1 TABLET BY MOUTH TWICE A DAY WITH MEALS 60 tablet 0   . glucose blood (ONE TOUCH ULTRA TEST) test strip 1 each by Other route 2 (two) times daily.Use as instructed 300 each 1   . guaiFENesin (MUCINEX) 600 MG 12 hr tablet Take 1 tablet (600 mg total) by mouth every 12 (twelve) hours. 20 tablet 0   . insulin glargine (LANTUS SOLOSTAR) 100 UNIT/ML injection pen 50 units SQ at HS per pt 15 pen 3   . JANUMET 50-1000 MG tablet TAKE 1 TABLET BY MOUTH 2 (TWO) TIMES DAILY WITH MEALS. 180 tablet 3   . niacin (NIASPAN) 500 MG CR tablet TAKE 1 TABLET BY MOUTH EVERY DAY AT BEDTIME  3   . spironolactone (ALDACTONE) 25 MG tablet Take 25 mg by mouth daily.     . famotidine (PEPCID) 20 MG tablet TAKE 1 TABLET  BY MOUTH TWICE A DAY 60 tablet 0   . zolpidem (AMBIEN) 10 MG tablet Take 10 mg by mouth nightly as needed for Sleep.       Current Facility-Administered Medications   Medication Dose Route Frequency Provider Last Rate Last Dose   . levalbuterol (XOPENEX) nebulizer solution 1.25 mg  1.25 mg Nebulization TID Zorita Pang, MD           Allergies:  No Known Allergies    Past Medical History:  Past Medical History:   Diagnosis Date   . AICD (automatic cardioverter/defibrillator) present 2014   . CAD (coronary artery disease)    . Cardiac defibrillator in situ    . CHF (congestive heart failure) 08/12/2014   . Diabetes mellitus    . Heart attack 2011    and 2014   . Hyperlipemia    . Hypertension    . Ischemic cardiomyopathy     20%   . Pneumonia 09/2016       Past Surgical History:  Past Surgical History:   Procedure Laterality Date   . CARDIAC CATHETERIZATION     .  CARDIAC DEFIBRILLATOR PLACEMENT     . CORONARY STENT PLACEMENT  2011   . duodenal ulcer  1973   . EGD N/A 08/15/2014    Procedure: EGD;  Surgeon: Colon Branch, MD;  Location[facility];  Service: Gastroenterology;  Laterality: N/A;  egd w/ bx   . HERNIA REPAIR      [facility]   . orthopedic surgery      R foot corrective   . TONSILLECTOMY AND ADENOIDECTOMY  1954       Family History:  Family History   Problem Relation Age of Onset   . Breast cancer Mother    . Heart attack Mother 36   . Diabetes Brother        Social History:  Social History     Social History   . Marital status: Married     Spouse name: Lajoyce Corners   . Number of children: 0   . Years of education: N/A     Occupational History   . 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 Smoking status: Never Smoker   . Smokeless tobacco: Never Used   . Alcohol use No   . Drug use: No   . Sexual activity: Yes     Other Topics Concern   . Not on file     Social History Narrative   . No narrative on file       The following sections were reviewed this encounter by the provider:        Vitals:  BP 126/77   Pulse 84   Temp 97.7 F (36.5 C) (Oral)   Resp 22   Wt 93.6 kg (206 lb 6.4 oz)   SpO2 97%   BMI 27.99 kg/m         ROS:  Constitutional: Negative for fever, chills and malaise/fatigue.   Respiratory: Negative for cough, shortness of breath and wheezing.    Cardiovascular: Negative for chest pain and palpitations.   Gastrointestinal: Negative for abdominal pain, diarrhea and constipation.   Musculoskeletal: Negative for joint pain.   Skin: Negative for rash.   Neurological: Negative for dizziness and headaches.   Psychiatric/Behavioral: The patient does not have insomnia.         Objective:     Physical Exam:  Constitutional: Patient  is oriented to person, place, and time. Appears well-developed and well-nourished.   HENT:   Head: Normocephalic and atraumatic.   Eyes: Conjunctivae and EOM are normal. Pupils are equal, round, and  reactive to light.   Cardiovascular: Normal rate, regular rhythm and normal heart sounds.  Exam reveals no gallop and no friction rub.    No murmur heard.  Pulmonary/Chest: Effort normal and breath sounds normal. Has no wheezes.   Musculoskeletal: Normal range of motion. Exhibits no edema or tenderness.   Neurological: Patient is alert and oriented to person, place, and time.   Psychiatric: Patient has a normal mood and affect.        Assessment/Plan:       1. Cough  - hydrocodone-chlorpheniramine (TUSSIONEX) 10-8 MG/5ML suspension; Take 5 mLs by mouth 2 (two) times daily.  Dispense: 115 mL; Refill: 0  - XR Chest 2 Views; Future  Recommend to continue to push exercise and stay on a good diet . Try to get to the perfect BMI of 25. Start your day with a high fiber diet and  involve the family in your exercise regiment .          Zorita Pang, MD

## 2016-12-03 ENCOUNTER — Other Ambulatory Visit: Payer: Self-pay | Admitting: Cardiovascular Disease

## 2016-12-03 DIAGNOSIS — I429 Cardiomyopathy, unspecified: Secondary | ICD-10-CM

## 2016-12-04 LAB — QUANTIFERON(R)-TB GOLD ITM (HLAB)
Mitogen-NIL: >10
NIL: 0.03
Quantiferon: NEGATIVE
TB Ag-Nil: 0.01

## 2016-12-07 ENCOUNTER — Encounter (INDEPENDENT_AMBULATORY_CARE_PROVIDER_SITE_OTHER): Payer: Self-pay

## 2016-12-07 NOTE — Progress Notes (Signed)
Result is in normal range. Please f/u as recommended at  last visit. This basically r/o Tb

## 2016-12-12 ENCOUNTER — Ambulatory Visit
Admission: RE | Admit: 2016-12-12 | Discharge: 2016-12-12 | Disposition: A | Discharge status: Home / Self Care | Payer: Commercial Managed Care - POS | Source: Ambulatory Visit | Attending: Family Medicine | Admitting: Family Medicine

## 2016-12-12 ENCOUNTER — Other Ambulatory Visit (INDEPENDENT_AMBULATORY_CARE_PROVIDER_SITE_OTHER): Payer: Self-pay | Admitting: Family Medicine

## 2016-12-12 DIAGNOSIS — R059 Cough, unspecified: Secondary | ICD-10-CM

## 2016-12-12 DIAGNOSIS — R05 Cough: Secondary | ICD-10-CM | POA: Insufficient documentation

## 2016-12-14 ENCOUNTER — Ambulatory Visit (INDEPENDENT_AMBULATORY_CARE_PROVIDER_SITE_OTHER): Payer: Commercial Managed Care - POS | Admitting: Family Medicine

## 2016-12-14 ENCOUNTER — Encounter (INDEPENDENT_AMBULATORY_CARE_PROVIDER_SITE_OTHER): Payer: Self-pay | Admitting: Family Medicine

## 2016-12-14 ENCOUNTER — Encounter (INDEPENDENT_AMBULATORY_CARE_PROVIDER_SITE_OTHER): Payer: Self-pay

## 2016-12-14 VITALS — BP 126/79 | HR 92 | Temp 98.0°F | Resp 20 | Ht 72.0 in | Wt 204.2 lb

## 2016-12-14 DIAGNOSIS — Z794 Long term (current) use of insulin: Secondary | ICD-10-CM

## 2016-12-14 DIAGNOSIS — Z0000 Encounter for general adult medical examination without abnormal findings: Secondary | ICD-10-CM

## 2016-12-14 DIAGNOSIS — E119 Type 2 diabetes mellitus without complications: Secondary | ICD-10-CM

## 2016-12-14 DIAGNOSIS — R059 Cough, unspecified: Secondary | ICD-10-CM

## 2016-12-14 MED ORDER — LANTUS SOLOSTAR 100 UNIT/ML SC SOPN
PEN_INJECTOR | SUBCUTANEOUS | 3 refills | Status: DC
Start: 2016-12-14 — End: 2017-07-12

## 2016-12-14 NOTE — Progress Notes (Signed)
Subjective:      Date: 12/14/2016 5:49 PM   Patient ID: [name] [name] is a 71 y.o. male.    Chief Complaint:  Chief Complaint   Patient presents with   . Annual Exam     patient is NOT fasting        HPI  Visit Type: Health Maintenance Visit  Work Status: part-time, Runner, 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Reported Health: good health  Diet: moderate compliance with and recommended diet  Exercise: 1-2x/week, 15-30 minutes/day and walking  Dental: regular dental visits twice a year  Vision: glasses and regular eye exams   Hearing: normal hearing  Immunization Status: immunizations up to date  Reproductive Health: sexually active  Prior Screening Tests: last PSA 1 year ago and colonoscopy within past 10 years  General Health Risks: no family history of prostate cancer, no family history of colon cancer, family history of breast cancer and mother had breast cancer  Safety Elements Used: uses seat belts and smoke detectors in household       Additional concerns: patient still complains of cough. Patient was diagnosed with bronchitis and pneumonia in December 2017      Problem List:  Patient Active Problem List   Diagnosis   . Chest pain   . Diabetes mellitus   . Heart attack   . CAD (coronary artery disease)   . Non-ischemic cardiomyopathy   . Acute exacerbation of CHF (congestive heart failure)   . PUD (peptic ulcer disease)   . Elevated LFTs   . Acute dyspnea   . Elevated d-dimer   . CHF (congestive heart failure)   . Headaches due to old head injury   . Essential hypertension   . Pneumonia due to infectious organism, unspecified laterality, unspecified part of lung   . Syncope and collapse   . AICD (automatic cardioverter/defibrillator) present   . Syncope, unspecified syncope type   . Pneumonia due to infectious organism   . Dyspnea, unspecified type   . Cardiomyopathy   . Cough       Current Medications:  Current Outpatient Prescriptions   Medication Sig Dispense Refill   . amlodipine-valsartan (EXFORGE) 10-320 MG per tablet TAKE 1 TABLET  BY MOUTH DAILY. 90 tablet 2   . aspirin 81 MG chewable tablet Chew 1 tablet (81 mg total) by mouth daily.  0   . atorvastatin (LIPITOR) 40 MG tablet Take 40 mg by mouth daily.     . carvedilol (COREG) 25 MG tablet TAKE 1 TABLET BY MOUTH TWICE A DAY WITH MEALS 60 tablet 0   . famotidine (PEPCID) 20 MG tablet TAKE 1 TABLET BY MOUTH TWICE A DAY 60 tablet 0   . glucose blood (ONE TOUCH ULTRA TEST) test strip 1 each by Other route 2 (two) times daily.Use as instructed 300 each 1   . hydrocodone-chlorpheniramine (TUSSIONEX) 10-8 MG/5ML suspension Take 5 mLs by mouth 2 (two) times daily. 115 mL 0   . insulin glargine (LANTUS SOLOSTAR) 100 UNIT/ML injection pen 50 units SQ at HS per pt 15 pen 3   . JANUMET 50-1000 MG tablet TAKE 1 TABLET BY MOUTH 2 (TWO) TIMES DAILY WITH MEALS. 180 tablet 3   . niacin (NIASPAN) 500 MG CR tablet TAKE 1 TABLET BY MOUTH EVERY DAY AT BEDTIME  3   . spironolactone (ALDACTONE) 25 MG tablet Take 25 mg by mouth daily.     Marland Kitchen zolpidem (AMBIEN) 10 MG tablet Take 10 mg by mouth nightly as needed for Sleep.  Current Facility-Administered Medications   Medication Dose Route Frequency Provider Last Rate Last Dose   . levalbuterol (XOPENEX) nebulizer solution 1.25 mg  1.25 mg Nebulization TID Zorita Pang, MD           Allergies:  No Known Allergies    Past Medical History:  Past Medical History:   Diagnosis Date   . AICD (automatic cardioverter/defibrillator) present 2014   . CAD (coronary artery disease)    . Cardiac defibrillator in situ    . CHF (congestive heart failure) 08/12/2014   . Diabetes mellitus    . Heart attack 2011    and 2014   . Hyperlipemia    . Hypertension    . Ischemic cardiomyopathy     20%   . Pneumonia 09/2016       Past Surgical History:  Past Surgical History:   Procedure Laterality Date   . CARDIAC CATHETERIZATION     . CARDIAC DEFIBRILLATOR PLACEMENT     . CORONARY STENT PLACEMENT  2011   . duodenal ulcer  1973   . EGD N/A 08/15/2014    Procedure: EGD;  Surgeon: Colon Branch, MD;  Location: [facility];  Service: Gastroenterology;  Laterality: N/A;  egd w/ bx   . HERNIA REPAIR      [facility]   . orthopedic surgery      R foot corrective   . TONSILLECTOMY AND ADENOIDECTOMY  1954       Family History:  Family History   Problem Relation Age of Onset   . Breast cancer Mother    . Heart attack Mother 52   . Diabetes Brother        Social History:  Social History     Social History   . Marital status: Married     Spouse name: Lajoyce Corners   . Number of children: 0   . Years of education: N/A     Occupational History   . 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 Smoking status: Never Smoker   . Smokeless tobacco: Never Used   . Alcohol use No   . Drug use: No   . Sexual activity: Yes     Other Topics Concern   . Not on file     Social History Narrative   . No narrative on file       The following sections were reviewed this encounter by the provider:   Tobacco  Med Hx  Surg Hx  Fam Hx  Soc Hx          Vitals:  BP 126/79   Pulse 92   Temp 98 F (36.7 C) (Oral)   Resp 20   Ht 1.829 m (6')   Wt 92.6 kg (204 lb 3.2 oz)   SpO2 95%   BMI 27.69 kg/m     ROS:   General/Constitutional:   Denies Change in appetite. Denies Chills. Denies Fatigue. Denies Fever. Denies Weight gain. Denies Weight loss.   Ophthalmologic:   Denies Blurred vision. Denies Diminished visual acuity. Denies Eye Pain.   ENT:   Denies Hearing Loss. Denies Nasal Discharge. Denies Hoarseness. Denies Ear pain. Denies Nosebleed. Denies Sinus pain. Denies Sore throat. Denies Sneezing.   Endocrine:   Denies Polydipsia. Denies Polyuria.   Cardiovascular:   Denies Chest pain. Denies Chest pain with exertion. Denies Leg Claudication. Denies Palpitations. Denies Swelling in hands/feet.   Respiratory:   Denies Paroxysmal  Nocturnal Dyspnea. Denies Cough. Denies Orthopnea. Denies Shortness of breath. Denies Daytime Hypersomnolence. Denies Snoring. Denies Witness Apnea. Denies Wheezing.    Gastrointestinal:   Denies Abdominal pain. Denies Blood in stool. Denies Constipation. Denies Diarrhea. Denies Heartburn. Denies Nausea. Denies Vomiting.   Hematology:   Denies Easy bruising. Denies Easy Bleeding.   Genitourinary:   Denies Blood in urine. Denies Nocturia.   Musculoskeletal:   Denies Joint pain. Denies Leg cramps. Denies Weakness in LE. Denies Swollen joints.   Peripheral Vascular:   Denies Cold extremities. Denies Decreased sensation in extremities. Denies Painful extremities.   Skin:   Denies Itching. Denies Change in Mole(s). Denies Rash.   Neurologic:   Denies Balance difficulty. Denies Dizziness. Denies Headache. Denies Pre-Syncope. Denies Memory loss.   Psychiatric:   Denies Anxiety. Denies Depressed mood. Denies Difficulty sleeping. Denies Mood Swings.        Objective:     Examination:   General Examination:   GENERAL APPEARANCE: alert, in no acute distress, well developed, well nourished, oriented to time, place, and person.   HEAD: normal appearance, atraumatic.   EYES: extraocular movement intact (EOMI), pupils equal, round, reactive to light and accommodation, sclera anicteric, conjunctiva clear.   EARS: tympanic membranes normal bilaterally, external canals normal .   NOSE: normal nasal mucosa, no lesions.   ORAL CAVITY: normal oropharynx, normal lips, mucosa moist, no lesions.   THROAT: normal appearance, clear, no erythema.   NECK/THYROID: neck supple, no carotid bruit, carotid pulse 2+ bilaterally, no cervical lymphadenopathy, no neck mass palpated, no jugular venous distention, no thyromegaly.   LYMPH NODES: no palpable adenopathy.   SKIN: good turgor, no rashes, no suspicious lesions.   HEART: S1, S2 normal, no murmurs, rubs, gallops, regular rate and rhythm.   LUNGS: normal effort / no distress, normal breath sounds, clear to auscultation bilaterally, no wheezes, rales, rhonchi.   ABDOMEN: bowel sounds present, no hepatosplenomegaly, soft, nontender, nondistended.   RECTAL: not  examined.   MALE GENITOURINARY: no penile lesions or discharge, no testicular mass.   MUSCULOSKELETAL: full range of motion, no swelling or deformity.   EXTREMITIES: no edema, no clubbing, cyanosis, or edema.   PERIPHERAL PULSES: 2+ dorsalis pedis, 2+ posterior tibial.   NEUROLOGIC: nonfocal, cranial nerves 2-12 grossly intact, deep tendon reflexes 2+ symmetrical, normal strength, tone and reflexes, sensory exam intact.   PSYCH: cognitive function intact, mood/affect full range, speech clear.        Assessment/Plan:       1. Well adult exam  - CBC and differential; Future  - Comprehensive metabolic panel; Future  - Lipid panel; Future  - Hemoglobin A1C; Future  - Microalbumin, Random Urine; Future  Recommend to continue to push exercise and stay on a good diet . Try to get to the perfect BMI of 25. Start your day with a high fiber diet and  involve the family in your exercise regiment .

## 2016-12-14 NOTE — Patient Instructions (Signed)
Recommend to continue to push exercise and stay on a good diet . Try to get to the perfect BMI of 25. Start your day with a high fiber diet and  involve the family in your exercise regiment .

## 2016-12-15 ENCOUNTER — Ambulatory Visit
Admission: RE | Admit: 2016-12-15 | Discharge: 2016-12-15 | Disposition: A | Discharge status: Home / Self Care | Payer: Commercial Managed Care - POS | Source: Ambulatory Visit | Attending: Cardiovascular Disease | Admitting: Cardiovascular Disease

## 2016-12-15 DIAGNOSIS — I429 Cardiomyopathy, unspecified: Secondary | ICD-10-CM | POA: Insufficient documentation

## 2016-12-16 ENCOUNTER — Other Ambulatory Visit (INDEPENDENT_AMBULATORY_CARE_PROVIDER_SITE_OTHER): Payer: Self-pay

## 2016-12-16 LAB — VAHRT HISTORIC LVEF: Ejection Fraction: 30 %

## 2016-12-29 ENCOUNTER — Other Ambulatory Visit (FREE_STANDING_LABORATORY_FACILITY): Payer: Commercial Managed Care - POS

## 2016-12-29 DIAGNOSIS — Z0000 Encounter for general adult medical examination without abnormal findings: Secondary | ICD-10-CM

## 2016-12-29 LAB — CBC AND DIFFERENTIAL
Absolute NRBC: 0 10*3/uL
Basophils Absolute Automated: 0.01 10*3/uL (ref 0.00–0.20)
Basophils Automated: 0.1 %
Eosinophils Absolute Automated: 0.42 10*3/uL (ref 0.00–0.70)
Eosinophils Automated: 5.9 %
Hematocrit: 44.1 % (ref 42.0–52.0)
Hgb: 14.2 g/dL (ref 13.0–17.0)
Immature Granulocytes Absolute: 0.01 10*3/uL
Immature Granulocytes: 0.1 %
Lymphocytes Absolute Automated: 3.79 10*3/uL (ref 0.50–4.40)
Lymphocytes Automated: 53.2 %
MCH: 33.1 pg — ABNORMAL HIGH (ref 28.0–32.0)
MCHC: 32.2 g/dL (ref 32.0–36.0)
MCV: 102.8 fL — ABNORMAL HIGH (ref 80.0–100.0)
MPV: 12.8 fL — ABNORMAL HIGH (ref 9.4–12.3)
Monocytes Absolute Automated: 0.38 10*3/uL (ref 0.00–1.20)
Monocytes: 5.3 %
Neutrophils Absolute: 2.51 10*3/uL (ref 1.80–8.10)
Neutrophils: 35.4 %
Nucleated RBC: 0 /100 WBC (ref 0.0–1.0)
Platelets: 137 10*3/uL — ABNORMAL LOW (ref 140–400)
RBC: 4.29 10*6/uL — ABNORMAL LOW (ref 4.70–6.00)
RDW: 13 % (ref 12–15)
WBC: 7.12 10*3/uL (ref 3.50–10.80)

## 2016-12-29 LAB — COMPREHENSIVE METABOLIC PANEL
ALT: 9 U/L (ref 0–55)
AST (SGOT): 12 U/L (ref 5–34)
Albumin/Globulin Ratio: 1.3 (ref 0.9–2.2)
Albumin: 4.2 g/dL (ref 3.5–5.0)
Alkaline Phosphatase: 83 U/L (ref 38–106)
BUN: 11 mg/dL (ref 9.0–28.0)
Bilirubin, Total: 0.7 mg/dL (ref 0.1–1.2)
CO2: 25 mEq/L (ref 21–29)
Calcium: 9.4 mg/dL (ref 7.9–10.2)
Chloride: 106 mEq/L (ref 100–111)
Creatinine: 1 mg/dL (ref 0.5–1.5)
Globulin: 3.2 g/dL (ref 2.0–3.7)
Glucose: 148 mg/dL — ABNORMAL HIGH (ref 70–100)
Potassium: 4.7 mEq/L (ref 3.5–5.1)
Protein, Total: 7.4 g/dL (ref 6.0–8.3)
Sodium: 141 mEq/L (ref 136–145)

## 2016-12-29 LAB — LIPID PANEL
Cholesterol / HDL Ratio: 2.8
Cholesterol: 129 mg/dL (ref 0–199)
HDL: 46 mg/dL (ref 40–9999)
LDL Calculated: 72 mg/dL (ref 0–99)
Triglycerides: 57 mg/dL (ref 34–149)
VLDL Calculated: 11 mg/dL (ref 10–40)

## 2016-12-29 LAB — GFR: EGFR: >60

## 2016-12-29 LAB — MICROALBUMIN, RANDOM URINE
Urine Creatinine, Random: 120 mg/dL
Urine Microalbumin, Random: 40 — ABNORMAL HIGH (ref 0.0–30.0)
Urine Microalbumin/Creatinine Ratio: 33 ug/mg — ABNORMAL HIGH (ref 0–30)

## 2016-12-29 LAB — HEMOGLOBIN A1C
Average Estimated Glucose: 151.3 mg/dL
Hemoglobin A1C: 6.9 % — ABNORMAL HIGH (ref 4.6–5.9)

## 2016-12-29 LAB — HEMOLYSIS INDEX: Hemolysis Index: 14 (ref 0–18)

## 2016-12-29 NOTE — Progress Notes (Signed)
Excellent improvement . Results are not in normal range,but fare better than last time . Please continue current treatment and follow up  in 3 month

## 2016-12-30 ENCOUNTER — Encounter (INDEPENDENT_AMBULATORY_CARE_PROVIDER_SITE_OTHER): Payer: Self-pay

## 2017-01-04 ENCOUNTER — Encounter (INDEPENDENT_AMBULATORY_CARE_PROVIDER_SITE_OTHER): Payer: Self-pay | Admitting: Family Medicine

## 2017-01-18 ENCOUNTER — Other Ambulatory Visit (INDEPENDENT_AMBULATORY_CARE_PROVIDER_SITE_OTHER): Payer: Self-pay | Admitting: Family Medicine

## 2017-01-18 DIAGNOSIS — R059 Cough, unspecified: Secondary | ICD-10-CM

## 2017-01-20 ENCOUNTER — Telehealth (INDEPENDENT_AMBULATORY_CARE_PROVIDER_SITE_OTHER): Payer: Self-pay | Admitting: Family Medicine

## 2017-01-20 NOTE — Telephone Encounter (Signed)
Spoke with patient. Patient wanted to get into see Dr Altamease Oiler earlier for recurrent cough. appt booked for Friday 01/22/2017 at 4 pm as a follow up. Pt verbalized understanding.      As for refill, patient was requesting refill on ABX. Patient needs to follow up with PCP prior to refill.

## 2017-01-22 ENCOUNTER — Ambulatory Visit (INDEPENDENT_AMBULATORY_CARE_PROVIDER_SITE_OTHER): Payer: Commercial Managed Care - POS | Admitting: Family Medicine

## 2017-01-22 ENCOUNTER — Encounter (INDEPENDENT_AMBULATORY_CARE_PROVIDER_SITE_OTHER): Payer: Self-pay | Admitting: Family Medicine

## 2017-01-22 VITALS — BP 126/78 | HR 84 | Temp 97.3°F | Resp 22 | Wt 212.8 lb

## 2017-01-22 DIAGNOSIS — R05 Cough: Secondary | ICD-10-CM

## 2017-01-22 DIAGNOSIS — R059 Cough, unspecified: Secondary | ICD-10-CM

## 2017-01-22 MED ORDER — HYDROCOD POLST-CPM POLST ER 10-8 MG/5ML PO SUER
5.0000 mL | Freq: Two times a day (BID) | ORAL | 0 refills | Status: DC
Start: 2017-01-22 — End: 2017-02-10

## 2017-01-22 NOTE — Patient Instructions (Signed)
Adult Self-Care for Colds    Colds are caused by viruses. They can't be cured with antibiotics. However, you can ease symptoms and support your body's efforts to heal itself. No matter which symptoms you have, be sure to:   Drink plenty of fluids (water or clear soup)   Stop smoking and drinking alcohol   Get plenty of rest  Understand a fever   Take your temperature several times a day. If your fever is100.4F(38.0C) for more than a day, call your healthcare provider.   Relax, lie down. Go to bed if you want. Just get off your feet and rest. Also, drink plenty of fluids to avoid dehydration.   Take acetaminophen or a nonsteroidal anti-inflammatory agent (NSAID), such as ibuprofen.  Treat a troubled nose kindly   Breathe steam or heated humidified air to open blocked nasal passages. Stand in a hot shower or use a vaporizer. Be careful not to get burned by the steam.   Saline nasal sprays and decongestant tablets help open a stuffy nose. Antihistamines can also help, but they can cause side effects such as drowsiness and drying of the eyes, nose, and mouth.  Soothe a sore throat and cough   Gargle every2hours with1/4teaspoon of salt dissolved in1/2 cup of warm water. Suck on throat lozenges and cough drops to moisten your throat.   Cough medicines are available but it is unclear how well they actually work.   Take acetaminophen or an NSAID, such as ibuprofen, to ease throat pain  Ease digestive problems   Put fluids back into your body. Take frequent sips of clear liquids such as water or broth. Avoid drinks that have a lot of sugar in them, such as juices and sodas. These can make diarrhea worse. Older children and adults can drink sports drinks.   As your appetite returns, you can resume your normal diet. Ask your healthcare provider if there are any foods you should avoid.  When to seek medical care  When you first notice symptoms, ask your healthcare provider if antiviral medicines are  appropriate.Antibiotics should not be taken for colds or flu. Also, call your healthcare provider if you have any of the following symptoms or if you aren't feeling better after 7 days:   Shortness of breath   Pain or pressure in the chest or belly (abdomen)   Worsening symptoms, especially after a period of improvement   Fever of100.4F (38.0C) or higher, or fever that doesn't go down with medicine   Sudden dizziness or confusion   Severe or continued vomiting   Signs of dehydration, including extreme thirst, dark urine, infrequent urination, dry mouth   Spotted, red, or very sore throat   Date Last Reviewed: 09/19/2015   2000-2017 [employer] [address], [place] [address]. All rights reserved. This information is not intended as a substitute for professional medical care. Always follow your healthcare professional's instructions.

## 2017-01-22 NOTE — Progress Notes (Signed)
Subjective:      Date: 01/22/2017 4:16 PM   Patient ID: [name] [name] is a 71 y.o. male.    Chief Complaint:  Chief Complaint   Patient presents with   . Cough     since 09/2016, pnuemonia and brochitis        HPI:  Patient presents for cough follow up.  Back in 09/2016 patient was diagnosed with pneumonia and bronchitis. Patient states his cough is improving and has an appointment with Dr Pamelia Hoit on 02/01/2017. Patient is also requesting a medication refill for Tussionex. Patient states he doesn't know if mold is present in his home and could be contributing to his cough. Patient states laying down/sleeping aggravates his cough and sleeps downstairs on the couch sitting up, so he doesn't bother his wife with his cough.       Cough   This is a recurrent problem. Episode onset: since 09/2015. The problem has been gradually improving. The problem occurs constantly. The cough is productive of sputum. Associated symptoms include nasal congestion and wheezing. The symptoms are aggravated by lying down. Treatments tried: tussionex  The treatment provided significant relief. His past medical history is significant for bronchitis and pneumonia.       Problem List:  Patient Active Problem List   Diagnosis   . Chest pain   . Diabetes mellitus   . Heart attack   . CAD (coronary artery disease)   . Non-ischemic cardiomyopathy   . Acute exacerbation of CHF (congestive heart failure)   . PUD (peptic ulcer disease)   . Elevated LFTs   . Acute dyspnea   . Elevated d-dimer   . CHF (congestive heart failure)   . Headaches due to old head injury   . Essential hypertension   . Pneumonia due to infectious organism, unspecified laterality, unspecified part of lung   . Syncope and collapse   . AICD (automatic cardioverter/defibrillator) present   . Syncope, unspecified syncope type   . Pneumonia due to infectious organism   . Dyspnea, unspecified type   . Cardiomyopathy   . Cough       Current Medications:  Current Outpatient  Prescriptions   Medication Sig Dispense Refill   . amlodipine-valsartan (EXFORGE) 10-320 MG per tablet TAKE 1 TABLET BY MOUTH DAILY. 90 tablet 2   . aspirin 81 MG chewable tablet Chew 1 tablet (81 mg total) by mouth daily.  0   . atorvastatin (LIPITOR) 40 MG tablet Take 40 mg by mouth daily.     . carvedilol (COREG) 25 MG tablet TAKE 1 TABLET BY MOUTH TWICE A DAY WITH MEALS 60 tablet 0   . famotidine (PEPCID) 20 MG tablet TAKE 1 TABLET BY MOUTH TWICE A DAY 60 tablet 0   . glucose blood (ONE TOUCH ULTRA TEST) test strip 1 each by Other route 2 (two) times daily.Use as instructed 300 each 1   . hydrocodone-chlorpheniramine (TUSSIONEX) 10-8 MG/5ML suspension Take 5 mLs by mouth 2 (two) times daily. 115 mL 0   . insulin glargine (LANTUS SOLOSTAR) 100 UNIT/ML injection pen 50 units SQ at HS per pt 15 pen 3   . JANUMET 50-1000 MG tablet TAKE 1 TABLET BY MOUTH 2 (TWO) TIMES DAILY WITH MEALS. 180 tablet 3   . niacin (NIASPAN) 500 MG CR tablet TAKE 1 TABLET BY MOUTH EVERY DAY AT BEDTIME  3   . spironolactone (ALDACTONE) 25 MG tablet Take 25 mg by mouth daily.     Marland Kitchen zolpidem (AMBIEN)  10 MG tablet Take 10 mg by mouth nightly as needed for Sleep.       Current Facility-Administered Medications   Medication Dose Route Frequency Provider Last Rate Last Dose   . levalbuterol (XOPENEX) nebulizer solution 1.25 mg  1.25 mg Nebulization TID Zorita Pang, MD           Allergies:  No Known Allergies    Past Medical History:  Past Medical History:   Diagnosis Date   . AICD (automatic cardioverter/defibrillator) present 2014   . CAD (coronary artery disease)    . Cardiac defibrillator in situ    . CHF (congestive heart failure) 08/12/2014   . Diabetes mellitus    . Heart attack 2011    and 2014   . Hyperlipemia    . Hypertension    . Ischemic cardiomyopathy     20%   . Pneumonia 09/2016       Past Surgical History:  Past Surgical History:   Procedure Laterality Date   . CARDIAC CATHETERIZATION     . CARDIAC DEFIBRILLATOR PLACEMENT     .  CORONARY STENT PLACEMENT  2011   . duodenal ulcer  1973   . EGD N/A 08/15/2014    Procedure: EGD;  Surgeon: Colon Branch, MD;  Location: [facility];  Service: Gastroenterology;  Laterality: N/A;  egd w/ bx   . HERNIA REPAIR      [facility]   . orthopedic surgery      R foot corrective   . TONSILLECTOMY AND ADENOIDECTOMY  1954       Family History:  Family History   Problem Relation Age of Onset   . Breast cancer Mother    . Heart attack Mother 65   . Diabetes Brother        Social History:  Social History     Social History   . Marital status: Married     Spouse name: Lajoyce Corners   . Number of children: 0   . Years of education: N/A     Occupational History   . 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 Smoking status: Never Smoker   . Smokeless tobacco: Never Used   . Alcohol use No   . Drug use: No   . Sexual activity: Yes     Other Topics Concern   . Not on file     Social History Narrative   . No narrative on file       The following sections were reviewed this encounter by the provider:   Tobacco  Med Hx  Surg Hx  Fam Hx  Soc Hx        Vitals:  BP 126/78   Pulse 84   Temp 97.3 F (36.3 C) (Oral)   Resp 22   Wt 96.5 kg (212 lb 12.8 oz)   SpO2 96%   BMI 28.86 kg/m         ROS:  Constitutional: Negative for fever, chills and malaise/fatigue.   Respiratory: Pos for cough, shortness of breath and wheezing.    Cardiovascular: Negative for chest pain and palpitations.   Gastrointestinal: Negative for abdominal pain, diarrhea and constipation.   Musculoskeletal: Negative for joint pain.   Skin: Negative for rash.   Neurological: Negative for dizziness and headaches.   Psychiatric/Behavioral: The patient does not have insomnia.         Objective:     Physical Exam:  Constitutional: Patient is oriented to person, place, and time. Appears well-developed and well-nourished.   HENT:   Head: Normocephalic and atraumatic.   Eyes: Conjunctivae and EOM are normal. Pupils are equal, round, and  reactive to light.   Cardiovascular: Normal rate, regular rhythm and normal heart sounds.  Exam reveals no gallop and no friction rub.    No murmur heard.  Pulmonary/Chest: Effort normal and breath sounds normal. Has no wheezes.   Musculoskeletal: Normal range of motion. Exhibits no edema or tenderness.   Neurological: Patient is alert and oriented to person, place, and time.   Psychiatric: Patient has a normal mood and affect.        Assessment/Plan:       1. Cough  - hydrocodone-chlorpheniramine (TUSSIONEX) 10-8 MG/5ML suspension; Take 5 mLs by mouth 2 (two) times daily.  Dispense: 115 mL; Refill: 0  Will see pulmonologist on 16        Zorita Pang, MD

## 2017-01-28 ENCOUNTER — Other Ambulatory Visit (INDEPENDENT_AMBULATORY_CARE_PROVIDER_SITE_OTHER): Payer: Self-pay | Admitting: Family Medicine

## 2017-01-28 DIAGNOSIS — R059 Cough, unspecified: Secondary | ICD-10-CM

## 2017-01-31 NOTE — Telephone Encounter (Signed)
Patient sees pulmonologist on the 16 . Would discuss with pulmonologist if it makes sense to take an antibiotic.

## 2017-02-01 ENCOUNTER — Encounter (INDEPENDENT_AMBULATORY_CARE_PROVIDER_SITE_OTHER): Payer: Self-pay

## 2017-02-02 ENCOUNTER — Encounter (INDEPENDENT_AMBULATORY_CARE_PROVIDER_SITE_OTHER): Payer: Self-pay | Admitting: Family Medicine

## 2017-02-10 ENCOUNTER — Ambulatory Visit (INDEPENDENT_AMBULATORY_CARE_PROVIDER_SITE_OTHER): Payer: Commercial Managed Care - POS | Admitting: Family Medicine

## 2017-02-10 ENCOUNTER — Encounter (INDEPENDENT_AMBULATORY_CARE_PROVIDER_SITE_OTHER): Payer: Self-pay | Admitting: Family Medicine

## 2017-02-10 VITALS — BP 121/77 | HR 84 | Temp 97.8°F | Resp 20 | Wt 209.2 lb

## 2017-02-10 DIAGNOSIS — R05 Cough: Secondary | ICD-10-CM

## 2017-02-10 DIAGNOSIS — R062 Wheezing: Secondary | ICD-10-CM

## 2017-02-10 DIAGNOSIS — R059 Cough, unspecified: Secondary | ICD-10-CM

## 2017-02-10 MED ORDER — HYDROCOD POLST-CPM POLST ER 10-8 MG/5ML PO SUER
5.0000 mL | Freq: Two times a day (BID) | ORAL | 0 refills | Status: DC
Start: 2017-02-10 — End: 2017-07-12

## 2017-02-10 MED ORDER — FLUTICASONE-SALMETEROL 100-50 MCG/DOSE IN AEPB
1.0000 | INHALATION_SPRAY | Freq: Two times a day (BID) | RESPIRATORY_TRACT | 2 refills | Status: DC
Start: 2017-02-10 — End: 2018-03-31

## 2017-02-10 NOTE — Patient Instructions (Signed)
Recommend to continue to push exercise and stay on a good diet . Try to get to the perfect BMI of 25. Start your day with a high fiber diet and  involve the family in your exercise regiment .

## 2017-02-10 NOTE — Progress Notes (Signed)
Subjective:      Date: 02/10/2017 3:17 PM   Patient ID: [name] [name] is a 71 y.o. male.    Chief Complaint:  Chief Complaint   Patient presents with   . Cough     follow up        HPI:  Cough   This is a recurrent problem. Episode onset: since 11/2016. The problem has been gradually improving. The problem occurs hourly. The cough is productive of sputum. Associated symptoms include wheezing. The symptoms are aggravated by lying down. His past medical history is significant for bronchitis and pneumonia.       Patient states he was seen by Dr Teena Dunk, cardiologist and Dr Shawnee Knapp, ENT doctor. Dr Teena Dunk and Dr Shawnee Knapp recommended the patient to be seen by his primary care doctor to follow up on cough. Patient states his cough is improving but still has some wheezing.       Problem List:  Patient Active Problem List   Diagnosis   . Chest pain   . Diabetes mellitus   . Heart attack   . CAD (coronary artery disease)   . Non-ischemic cardiomyopathy   . Acute exacerbation of CHF (congestive heart failure)   . PUD (peptic ulcer disease)   . Elevated LFTs   . Acute dyspnea   . Elevated d-dimer   . CHF (congestive heart failure)   . Headaches due to old head injury   . Essential hypertension   . Pneumonia due to infectious organism, unspecified laterality, unspecified part of lung   . Syncope and collapse   . AICD (automatic cardioverter/defibrillator) present   . Syncope, unspecified syncope type   . Pneumonia due to infectious organism   . Dyspnea, unspecified type   . Cardiomyopathy   . Cough       Current Medications:  Current Outpatient Prescriptions   Medication Sig Dispense Refill   . amlodipine-valsartan (EXFORGE) 10-320 MG per tablet TAKE 1 TABLET BY MOUTH DAILY. 90 tablet 2   . aspirin 81 MG chewable tablet Chew 1 tablet (81 mg total) by mouth daily.  0   . atorvastatin (LIPITOR) 40 MG tablet Take 40 mg by mouth daily.     . carvedilol (COREG) 25 MG tablet TAKE 1 TABLET BY MOUTH TWICE A DAY WITH MEALS 60 tablet 0   .  glucose blood (ONE TOUCH ULTRA TEST) test strip 1 each by Other route 2 (two) times daily.Use as instructed 300 each 1   . hydrocodone-chlorpheniramine (TUSSIONEX) 10-8 MG/5ML suspension Take 5 mLs by mouth 2 (two) times daily. 115 mL 0   . insulin glargine (LANTUS SOLOSTAR) 100 UNIT/ML injection pen 50 units SQ at HS per pt 15 pen 3   . JANUMET 50-1000 MG tablet TAKE 1 TABLET BY MOUTH 2 (TWO) TIMES DAILY WITH MEALS. 180 tablet 3   . niacin (NIASPAN) 500 MG CR tablet TAKE 1 TABLET BY MOUTH EVERY DAY AT BEDTIME  3   . zolpidem (AMBIEN) 10 MG tablet Take 10 mg by mouth nightly as needed for Sleep.     . fluticasone-salmeterol (ADVAIR DISKUS) 100-50 MCG/DOSE Aerosol Powder, Breath Activtivatede Inhale 1 puff into the lungs 2 (two) times daily. 1 each 2     Current Facility-Administered Medications   Medication Dose Route Frequency Provider Last Rate Last Dose   . levalbuterol (XOPENEX) nebulizer solution 1.25 mg  1.25 mg Nebulization TID Zorita Pang, MD           Allergies:  No  Known Allergies    Past Medical History:  Past Medical History:   Diagnosis Date   . AICD (automatic cardioverter/defibrillator) present 2014   . CAD (coronary artery disease)    . Cardiac defibrillator in situ    . CHF (congestive heart failure) 08/12/2014   . Diabetes mellitus    . Heart attack 2011    and 2014   . Hyperlipemia    . Hypertension    . Ischemic cardiomyopathy     20%   . Pneumonia 09/2016       Past Surgical History:  Past Surgical History:   Procedure Laterality Date   . CARDIAC CATHETERIZATION     . CARDIAC DEFIBRILLATOR PLACEMENT     . CORONARY STENT PLACEMENT  2011   . duodenal ulcer  1973   . EGD N/A 08/15/2014    Procedure: EGD;  Surgeon: Colon Branch, MD;  Location: [facility];  Service: Gastroenterology;  Laterality: N/A;  egd w/ bx   . HERNIA REPAIR      [facility]   . orthopedic surgery      R foot corrective   . TONSILLECTOMY AND ADENOIDECTOMY  1954       Family History:  Family History   Problem Relation Age  of Onset   . Breast cancer Mother    . Heart attack Mother 66   . Diabetes Brother        Social History:  Social History     Social History   . Marital status: Married     Spouse name: Lajoyce Corners   . Number of children: 0   . Years of education: N/A     Occupational History   . 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 Smoking status: Never Smoker   . Smokeless tobacco: Never Used   . Alcohol use No   . Drug use: No   . Sexual activity: Yes     Other Topics Concern   . Not on file     Social History Narrative   . No narrative on file       The following sections were reviewed this encounter by the provider:        Vitals:  BP 121/77   Pulse 84   Temp 97.8 F (36.6 C) (Oral)   Resp 20   Wt 94.9 kg (209 lb 3.2 oz)   SpO2 96%   BMI 28.37 kg/m         ROS:  Constitutional: Negative for fever, chills and malaise/fatigue.   Respiratory: POS for cough, shortness of breath and wheezing.    Cardiovascular: Negative for chest pain and palpitations.   Gastrointestinal: Negative for abdominal pain, diarrhea and constipation.   Musculoskeletal: Negative for joint pain.   Skin: Negative for rash.   Neurological: Negative for dizziness and headaches.   Psychiatric/Behavioral: The patient does not have insomnia.         Objective:     Physical Exam:  Constitutional: Patient is oriented to person, place, and time. Appears well-developed and well-nourished.   HENT:   Head: Normocephalic and atraumatic.   Eyes: Conjunctivae and EOM are normal. Pupils are equal, round, and reactive to light.   Cardiovascular: Normal rate, regular rhythm and normal heart sounds.  Exam reveals no gallop and no friction rub.    No murmur heard.  Pulmonary/Chest: Effort normal and breath sounds normal. Has no wheezes.   Musculoskeletal:  Normal range of motion. Exhibits no edema or tenderness.   Neurological: Patient is alert and oriented to person, place, and time.   Psychiatric: Patient has a normal mood and affect.         Assessment/Plan:       1. Cough  - hydrocodone-chlorpheniramine (TUSSIONEX) 10-8 MG/5ML suspension; Take 5 mLs by mouth 2 (two) times daily.  Dispense: 115 mL; Refill: 0  - fluticasone-salmeterol (ADVAIR DISKUS) 100-50 MCG/DOSE Aerosol Powder, Breath Activtivatede; Inhale 1 puff into the lungs 2 (two) times daily.  Dispense: 1 each; Refill: 2  - Spirometry with bronchodilator; Future    2. Wheezing  - Spirometry with bronchodilator; Future          Zorita Pang, MD

## 2017-02-11 ENCOUNTER — Ambulatory Visit (INDEPENDENT_AMBULATORY_CARE_PROVIDER_SITE_OTHER): Payer: Commercial Managed Care - POS

## 2017-02-12 ENCOUNTER — Other Ambulatory Visit (INDEPENDENT_AMBULATORY_CARE_PROVIDER_SITE_OTHER): Payer: Self-pay

## 2017-02-12 ENCOUNTER — Ambulatory Visit (INDEPENDENT_AMBULATORY_CARE_PROVIDER_SITE_OTHER): Payer: Commercial Managed Care - POS

## 2017-02-12 ENCOUNTER — Encounter (INDEPENDENT_AMBULATORY_CARE_PROVIDER_SITE_OTHER): Payer: Commercial Managed Care - POS | Admitting: Family Medicine

## 2017-02-12 ENCOUNTER — Ambulatory Visit (INDEPENDENT_AMBULATORY_CARE_PROVIDER_SITE_OTHER): Payer: Commercial Managed Care - POS | Admitting: Family Medicine

## 2017-02-12 DIAGNOSIS — R062 Wheezing: Secondary | ICD-10-CM

## 2017-02-12 DIAGNOSIS — R059 Cough, unspecified: Secondary | ICD-10-CM

## 2017-02-12 DIAGNOSIS — R05 Cough: Secondary | ICD-10-CM

## 2017-02-12 NOTE — Progress Notes (Signed)
Patient presents for PFT test done today in office.     Patient completed test and left the office in good standing, no other complaints.

## 2017-02-12 NOTE — Addendum Note (Signed)
Addended by: Claude Manges on: 02/12/2017 04:21 PM     Modules accepted: Level of Service

## 2017-02-15 NOTE — Progress Notes (Signed)
Result is in normal range. Please f/u as recommended at  last visit.

## 2017-02-17 ENCOUNTER — Encounter (INDEPENDENT_AMBULATORY_CARE_PROVIDER_SITE_OTHER): Payer: Self-pay

## 2017-02-28 ENCOUNTER — Other Ambulatory Visit (INDEPENDENT_AMBULATORY_CARE_PROVIDER_SITE_OTHER): Payer: Self-pay | Admitting: Family Medicine

## 2017-03-03 ENCOUNTER — Ambulatory Visit (INDEPENDENT_AMBULATORY_CARE_PROVIDER_SITE_OTHER): Payer: Self-pay

## 2017-03-05 ENCOUNTER — Telehealth (INDEPENDENT_AMBULATORY_CARE_PROVIDER_SITE_OTHER): Payer: Self-pay | Admitting: Family Medicine

## 2017-03-05 NOTE — Telephone Encounter (Signed)
[facility] calling for refill of     niacin (NIASPAN) 500 MG CR tablet

## 2017-03-08 ENCOUNTER — Other Ambulatory Visit (INDEPENDENT_AMBULATORY_CARE_PROVIDER_SITE_OTHER): Payer: Self-pay | Admitting: Family Medicine

## 2017-03-08 MED ORDER — NIACIN ER (ANTIHYPERLIPIDEMIC) 500 MG PO TBCR
500.0000 mg | EXTENDED_RELEASE_TABLET | Freq: Every day | ORAL | 3 refills | Status: DC
Start: 2017-03-08 — End: 2017-07-12

## 2017-03-08 NOTE — Telephone Encounter (Signed)
Refill request has been sent to the provider.

## 2017-03-08 NOTE — Telephone Encounter (Signed)
[facility] would like refill of     niacin (NIASPAN) 500 MG CR tablet    Thank you.

## 2017-03-08 NOTE — Telephone Encounter (Signed)
Pharmacy called for refill of Niacin 500 mg CR that was prescribed for a year on 10/23/15. Called pharmacy to verify last prescribing provider and although the chart looks like it was an outside provider, historic provider, they advised that [name] [name] was the last prescribing provider. Patient was last seen 02/10/17 for cough and 12/14/16 for annual wellness. No pending appointment. Would you like to prescribe this? Please advise. Thank you.    Diagnosis associated with medication was coronary artery disease involving coronary bypass graft of native heart with unstable angina pectoris.

## 2017-03-11 ENCOUNTER — Encounter (INDEPENDENT_AMBULATORY_CARE_PROVIDER_SITE_OTHER): Payer: Self-pay | Admitting: Family Medicine

## 2017-03-17 ENCOUNTER — Telehealth (INDEPENDENT_AMBULATORY_CARE_PROVIDER_SITE_OTHER): Payer: Self-pay | Admitting: Family Medicine

## 2017-03-17 NOTE — Telephone Encounter (Signed)
Marcelino Duster with [facility] has not been able to reach the patient for a couple of months. She can be reached at [phone]

## 2017-03-18 NOTE — Telephone Encounter (Signed)
See scanned letter dated 03/11/17.Marland KitchenMarland Kitchen

## 2017-05-04 ENCOUNTER — Encounter (INDEPENDENT_AMBULATORY_CARE_PROVIDER_SITE_OTHER): Payer: Self-pay | Admitting: Family Medicine

## 2017-05-06 ENCOUNTER — Encounter (INDEPENDENT_AMBULATORY_CARE_PROVIDER_SITE_OTHER): Payer: Self-pay | Admitting: Family Medicine

## 2017-06-07 ENCOUNTER — Other Ambulatory Visit (INDEPENDENT_AMBULATORY_CARE_PROVIDER_SITE_OTHER): Payer: Self-pay | Admitting: Family Medicine

## 2017-06-10 DIAGNOSIS — R5383 Other fatigue: Secondary | ICD-10-CM | POA: Insufficient documentation

## 2017-06-10 DIAGNOSIS — R55 Syncope and collapse: Secondary | ICD-10-CM | POA: Insufficient documentation

## 2017-06-10 DIAGNOSIS — R142 Eructation: Secondary | ICD-10-CM | POA: Insufficient documentation

## 2017-06-10 DIAGNOSIS — R0981 Nasal congestion: Secondary | ICD-10-CM | POA: Insufficient documentation

## 2017-06-10 DIAGNOSIS — R42 Dizziness and giddiness: Secondary | ICD-10-CM | POA: Insufficient documentation

## 2017-06-10 DIAGNOSIS — G479 Sleep disorder, unspecified: Secondary | ICD-10-CM | POA: Insufficient documentation

## 2017-06-12 ENCOUNTER — Ambulatory Visit (INDEPENDENT_AMBULATORY_CARE_PROVIDER_SITE_OTHER): Payer: Self-pay

## 2017-07-12 ENCOUNTER — Encounter (INDEPENDENT_AMBULATORY_CARE_PROVIDER_SITE_OTHER): Payer: Self-pay | Admitting: Family Medicine

## 2017-07-12 ENCOUNTER — Ambulatory Visit (INDEPENDENT_AMBULATORY_CARE_PROVIDER_SITE_OTHER): Payer: Commercial Managed Care - POS | Admitting: Family Medicine

## 2017-07-12 ENCOUNTER — Other Ambulatory Visit (INDEPENDENT_AMBULATORY_CARE_PROVIDER_SITE_OTHER): Payer: Self-pay | Admitting: Family Medicine

## 2017-07-12 VITALS — BP 123/73 | HR 99 | Temp 99.6°F | Wt 215.8 lb

## 2017-07-12 DIAGNOSIS — R059 Cough, unspecified: Secondary | ICD-10-CM

## 2017-07-12 DIAGNOSIS — B9689 Other specified bacterial agents as the cause of diseases classified elsewhere: Secondary | ICD-10-CM

## 2017-07-12 DIAGNOSIS — J069 Acute upper respiratory infection, unspecified: Secondary | ICD-10-CM

## 2017-07-12 DIAGNOSIS — Z23 Encounter for immunization: Secondary | ICD-10-CM

## 2017-07-12 DIAGNOSIS — R05 Cough: Secondary | ICD-10-CM

## 2017-07-12 MED ORDER — AMLODIPINE BESYLATE-VALSARTAN 10-320 MG PO TABS
1.0000 | ORAL_TABLET | Freq: Every day | ORAL | 3 refills | Status: DC
Start: 2017-07-12 — End: 2017-07-12

## 2017-07-12 MED ORDER — AMOXICILLIN-POT CLAVULANATE 875-125 MG PO TABS
1.0000 | ORAL_TABLET | Freq: Two times a day (BID) | ORAL | 0 refills | Status: AC
Start: 2017-07-12 — End: 2017-07-19

## 2017-07-12 MED ORDER — HYDROCOD POLST-CPM POLST ER 10-8 MG/5ML PO SUER
5.0000 mL | Freq: Two times a day (BID) | ORAL | 0 refills | Status: DC
Start: 2017-07-12 — End: 2017-09-16

## 2017-07-12 NOTE — Progress Notes (Signed)
Subjective:      Date: 07/12/2017 5:46 PM   Patient ID: [name] [name] is a 71 y.o. male.    Chief Complaint:  Chief Complaint   Patient presents with   . URI     recurrent symptoms    . Fatigue       HPI:  URI    This is a recurrent problem. The problem has been waxing and waning. There has been no fever. Associated symptoms include chest pain, congestion, coughing, headaches, rhinorrhea, sinus pain, sneezing and wheezing. Treatments tried: hydrocondodne- chlorphoniramine. The treatment provided moderate relief.   Fatigue   This is a recurrent problem. The problem occurs constantly. The problem has been gradually worsening. Associated symptoms include chest pain, congestion, coughing and headaches.       Patient is seeing a pulmonologist for possible COPD.     Patient is requesting a medication refill for hydrocodone-chlorphiramine and mudimucil     Problem List:  Patient Active Problem List   Diagnosis   . Chest pain   . Diabetes mellitus   . Heart attack   . CAD (coronary artery disease)   . Non-ischemic cardiomyopathy   . Acute exacerbation of CHF (congestive heart failure)   . PUD (peptic ulcer disease)   . Elevated LFTs   . Acute dyspnea   . Elevated d-dimer   . CHF (congestive heart failure)   . Headaches due to old head injury   . Essential hypertension   . Pneumonia due to infectious organism, unspecified laterality, unspecified part of lung   . Syncope and collapse   . AICD (automatic cardioverter/defibrillator) present   . Syncope, unspecified syncope type   . Pneumonia due to infectious organism   . Dyspnea, unspecified type   . Cardiomyopathy   . Cough       Current Medications:  Current Outpatient Prescriptions   Medication Sig Dispense Refill   . amitriptyline (ELAVIL) 25 MG tablet amitriptyline 25 mg tablet   Take 1 tablet every day by oral route.     Marland Kitchen amlodipine-valsartan (EXFORGE) 10-320 MG per tablet amlodipine 10 mg-valsartan 320 mg tablet   Take 1 tablet every day by oral route.     Marland Kitchen  aspirin 81 MG chewable tablet Chew 1 tablet (81 mg total) by mouth daily.  0   . atorvastatin (LIPITOR) 40 MG tablet atorvastatin 40 mg tablet   Take 1 tablet every day by oral route.     . carvedilol (COREG) 25 MG tablet TAKE 1 TABLET BY MOUTH TWICE DAILY WITH MEALS. 60 tablet 0   . esomeprazole (NEXIUM) 40 MG capsule      . fluticasone-salmeterol (ADVAIR DISKUS) 100-50 MCG/DOSE Aerosol Powder, Breath Activtivatede Inhale 1 puff into the lungs 2 (two) times daily. 1 each 2   . glucose blood (ONE TOUCH ULTRA TEST) test strip 1 each by Other route 2 (two) times daily.Use as instructed 300 each 1   . hydrocodone-chlorpheniramine (TUSSIONEX) 10-8 MG/5ML suspension Take 5 mLs by mouth 2 (two) times daily. 115 mL 0   . insulin glargine (LANTUS SOLOSTAR) 100 UNIT/ML injection pen Lantus Solostar U-100 Insulin 100 unit/mL (3 mL) subcutaneous pen     . JANUMET 50-1000 MG tablet TAKE 1 TABLET BY MOUTH 2 (TWO) TIMES DAILY WITH MEALS. 180 tablet 3   . niacin (NIASPAN) 500 MG CR tablet niacin ER 500 mg tablet,extended release 24 hr     . SYMBICORT 80-4.5 MCG/ACT inhaler      . zolpidem (  AMBIEN) 10 MG tablet Take 10 mg by mouth nightly as needed for Sleep.     Marland Kitchen amoxicillin-clavulanate (AUGMENTIN) 875-125 MG per tablet Take 1 tablet by mouth 2 (two) times daily.for 7 days 14 tablet 0     Current Facility-Administered Medications   Medication Dose Route Frequency Provider Last Rate Last Dose   . levalbuterol (XOPENEX) nebulizer solution 1.25 mg  1.25 mg Nebulization TID Zorita Pang, MD           Allergies:  No Known Allergies    Past Medical History:  Past Medical History:   Diagnosis Date   . AICD (automatic cardioverter/defibrillator) present 2014   . CAD (coronary artery disease)    . Cardiac defibrillator in situ    . CHF (congestive heart failure) 08/12/2014   . Diabetes mellitus    . Heart attack 2011    and 2014   . Hyperlipemia    . Hypertension    . Ischemic cardiomyopathy     20%   . Pneumonia 09/2016       Past  Surgical History:  Past Surgical History:   Procedure Laterality Date   . CARDIAC CATHETERIZATION     . CARDIAC DEFIBRILLATOR PLACEMENT     . CORONARY STENT PLACEMENT  2011   . duodenal ulcer  1973   . EGD N/A 08/15/2014    Procedure: EGD;  Surgeon: Colon Branch, MD;  Location: [facility];  Service: Gastroenterology;  Laterality: N/A;  egd w/ bx   . HERNIA REPAIR      [facility]   . orthopedic surgery      R foot corrective   . TONSILLECTOMY AND ADENOIDECTOMY  1954       Family History:  Family History   Problem Relation Age of Onset   . Breast cancer Mother    . Heart attack Mother 33   . Diabetes Brother        Social History:  Social History     Social History   . Marital status: Married     Spouse name: Lajoyce Corners   . Number of children: 0   . Years of education: N/A     Occupational History   . 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 Smoking status: Never Smoker   . Smokeless tobacco: Never Used   . Alcohol use No   . Drug use: No   . Sexual activity: Yes     Other Topics Concern   . Not on file     Social History Narrative   . No narrative on file       The following sections were reviewed this encounter by the provider:   Tobacco  Allergies  Meds  Problems  Med Hx  Surg Hx  Fam Hx  Soc Hx          Vitals:  BP 123/73   Pulse 99   Temp 99.6 F (37.6 C) (Oral)   Wt 97.9 kg (215 lb 12.8 oz)   SpO2 93%   BMI 29.27 kg/m         ROS:  Constitutional: Negative for fever, chills and malaise/fatigue.   Respiratory: Negative for cough, shortness of breath and wheezing.    Cardiovascular: Negative for chest pain and palpitations.   Gastrointestinal: Negative for abdominal pain, diarrhea and constipation.   Musculoskeletal: Negative for joint pain.   Skin: Negative for rash.   Neurological: Negative for  dizziness and headaches.   Psychiatric/Behavioral: The patient does not have insomnia.         Objective:     Physical Exam:  Constitutional: Patient is oriented to person, place,  and time. Appears well-developed and well-nourished.   HENT:   Head: Normocephalic and atraumatic.   Eyes: Conjunctivae and EOM are normal. Pupils are equal, round, and reactive to light.   Cardiovascular: Normal rate, regular rhythm and normal heart sounds.  Exam reveals no gallop and no friction rub.    No murmur heard.  Pulmonary/Chest: Effort normal and breath sounds normal. Has no wheezes.   Musculoskeletal: Normal range of motion. Exhibits no edema or tenderness.   Neurological: Patient is alert and oriented to person, place, and time.   Psychiatric: Patient has a normal mood and affect.        Assessment/Plan:       1. Need for prophylactic vaccination and inoculation against influenza  - Flu Vaccine High Dose, 65 yrs+ (IMM85)    2. Cough  - hydrocodone-chlorpheniramine (TUSSIONEX) 10-8 MG/5ML suspension; Take 5 mLs by mouth 2 (two) times daily.  Dispense: 115 mL; Refill: 0    3. Bacterial URI  - amoxicillin-clavulanate (AUGMENTIN) 875-125 MG per tablet; Take 1 tablet by mouth 2 (two) times daily.for 7 days  Dispense: 14 tablet; Refill: 0          Zorita Pang, MD

## 2017-07-12 NOTE — Patient Instructions (Signed)
Adult Self-Care for Colds    Colds are caused by viruses. They can't be cured with antibiotics. However, you can ease symptoms and support your body's efforts to heal itself. No matter which symptoms you have, be sure to:   Drink plenty of fluids (water or clear soup)   Stop smoking and drinking alcohol   Get plenty of rest  Understand a fever   Take your temperature several times a day. If your fever is100.4F(38.0C) for more than a day, call your healthcare provider.   Relax, lie down. Go to bed if you want. Just get off your feet and rest. Also, drink plenty of fluids to avoid dehydration.   Take acetaminophen or a nonsteroidal anti-inflammatory agent (NSAID), such as ibuprofen.  Treat a troubled nose kindly   Breathe steam or heated humidified air to open blocked nasal passages. Stand in a hot shower or use a vaporizer. Be careful not to get burned by the steam.   Saline nasal sprays and decongestant tablets help open a stuffy nose. Antihistamines can also help, but they can cause side effects such as drowsiness and drying of the eyes, nose, and mouth.  Soothe a sore throat and cough   Gargle every2hours with1/4teaspoon of salt dissolved in1/2 cup of warm water. Suck on throat lozenges and cough drops to moisten your throat.   Cough medicines are available but it is unclear how well they actually work.   Take acetaminophen or an NSAID, such as ibuprofen, to ease throat pain  Ease digestive problems   Put fluids back into your body. Take frequent sips of clear liquids such as water or broth. Avoid drinks that have a lot of sugar in them, such as juices and sodas. These can make diarrhea worse. Older children and adults can drink sports drinks.   As your appetite returns, you can resume your normal diet. Ask your healthcare provider if there are any foods you should avoid.  When to seek medical care  When you first notice symptoms, ask your healthcare provider if antiviral medicines are  appropriate.Antibiotics should not be taken for colds or flu. Also, call your healthcare provider if you have any of the following symptoms or if you aren't feeling better after 7 days:   Shortness of breath   Pain or pressure in the chest or belly (abdomen)   Worsening symptoms, especially after a period of improvement   Fever of100.4F (38.0C) or higher, or fever that doesn't go down with medicine   Sudden dizziness or confusion   Severe or continued vomiting   Signs of dehydration, including extreme thirst, dark urine, infrequent urination, dry mouth   Spotted, red, or very sore throat   Date Last Reviewed: 09/19/2015   2000-2018 [employer] [address], [place] [address]. All rights reserved. This information is not intended as a substitute for professional medical care. Always follow your healthcare professional's instructions.

## 2017-07-12 NOTE — Telephone Encounter (Signed)
[facility] pharmacy called to get pt medication refill for   amlodipine-valsartan (EXFORGE) 10-320 MG per tablet

## 2017-07-12 NOTE — Telephone Encounter (Signed)
LOV 02/10/2017

## 2017-07-12 NOTE — Progress Notes (Signed)
Have you seen any specialists/other providers since your last visit with [place]?      Yes, pulmonologist       Arm preference verified?     Yes    The patient is due for eye exam and influenza vaccine

## 2017-07-14 ENCOUNTER — Other Ambulatory Visit (INDEPENDENT_AMBULATORY_CARE_PROVIDER_SITE_OTHER): Payer: Self-pay

## 2017-07-14 NOTE — Telephone Encounter (Signed)
Last OV on 02/10/2017

## 2017-07-15 ENCOUNTER — Telehealth (INDEPENDENT_AMBULATORY_CARE_PROVIDER_SITE_OTHER): Payer: Self-pay

## 2017-07-15 MED ORDER — AMLODIPINE BESYLATE-VALSARTAN 10-320 MG PO TABS
ORAL_TABLET | ORAL | 1 refills | Status: DC
Start: 2017-07-15 — End: 2017-08-30

## 2017-07-15 NOTE — Telephone Encounter (Signed)
Called in prescription refill for amlodipine-valsartan 10-320 mg that accidentally printed. Left message on the pharmacy voicemail authorizing refills.

## 2017-07-21 ENCOUNTER — Encounter (INDEPENDENT_AMBULATORY_CARE_PROVIDER_SITE_OTHER): Payer: Self-pay | Admitting: Family Medicine

## 2017-07-21 ENCOUNTER — Ambulatory Visit (INDEPENDENT_AMBULATORY_CARE_PROVIDER_SITE_OTHER): Payer: Commercial Managed Care - POS | Admitting: Family Medicine

## 2017-07-21 VITALS — BP 107/69 | HR 91 | Temp 98.1°F | Wt 211.6 lb

## 2017-07-21 DIAGNOSIS — Z23 Encounter for immunization: Secondary | ICD-10-CM

## 2017-07-21 NOTE — Patient Instructions (Signed)
Recommend to continue to push exercise and stay on a good diet . Try to get to the perfect BMI of 25. Start your day with a high fiber diet and  involve the family in your exercise regiment .

## 2017-07-21 NOTE — Progress Notes (Signed)
Have you seen any specialists/other providers since your last visit with us?     Yes, pulmonologist     Arm preference verified?   Yes    The patient is due for nothing at this time, HM is up-to-date.

## 2017-07-21 NOTE — Progress Notes (Signed)
Subjective:      Date: 07/21/2017 3:45 PM   Patient ID: [name] [name] is a 71 y.o. male.    Chief Complaint:  Chief Complaint   Patient presents with   . Cough     medication follow up        HPI:  Patient presents for follow up on cough. Patient was last seen on 07/12/2017 and was given tussinex. Patient states symptoms of cough have greatly improved, O2 levels are at 100%. Patient is seeing a pulmonologist and has a possible history of COPD. Patient is awaiting on test to determine COPD.        Cough   This is a recurrent problem. The problem has been gradually improving. The problem occurs every few hours. The cough is non-productive. Treatments tried: Tussinex. The treatment provided significant relief. His past medical history is significant for COPD.       Problem List:  Patient Active Problem List   Diagnosis   . Chest pain   . Diabetes mellitus   . Heart attack   . CAD (coronary artery disease)   . Non-ischemic cardiomyopathy   . Acute exacerbation of CHF (congestive heart failure)   . PUD (peptic ulcer disease)   . Elevated LFTs   . Acute dyspnea   . Elevated d-dimer   . CHF (congestive heart failure)   . Headaches due to old head injury   . Essential hypertension   . Pneumonia due to infectious organism, unspecified laterality, unspecified part of lung   . Syncope and collapse   . AICD (automatic cardioverter/defibrillator) present   . Syncope, unspecified syncope type   . Pneumonia due to infectious organism   . Dyspnea, unspecified type   . Cardiomyopathy   . Cough       Current Medications:  Current Outpatient Prescriptions   Medication Sig Dispense Refill   . amitriptyline (ELAVIL) 25 MG tablet amitriptyline 25 mg tablet   Take 1 tablet every day by oral route.     Marland Kitchen amlodipine-valsartan (EXFORGE) 10-320 MG per tablet amlodipine 10 mg-valsartan 320 mg tablet   Take 1 tablet every day by oral route. 90 tablet 1   . aspirin 81 MG chewable tablet Chew 1 tablet (81 mg total) by mouth daily.  0   .  atorvastatin (LIPITOR) 40 MG tablet atorvastatin 40 mg tablet   Take 1 tablet every day by oral route.     . carvedilol (COREG) 25 MG tablet TAKE 1 TABLET BY MOUTH TWICE DAILY WITH MEALS. 60 tablet 0   . esomeprazole (NEXIUM) 40 MG capsule      . fluticasone-salmeterol (ADVAIR DISKUS) 100-50 MCG/DOSE Aerosol Powder, Breath Activtivatede Inhale 1 puff into the lungs 2 (two) times daily. 1 each 2   . glucose blood (ONE TOUCH ULTRA TEST) test strip 1 each by Other route 2 (two) times daily.Use as instructed 300 each 1   . hydrocodone-chlorpheniramine (TUSSIONEX) 10-8 MG/5ML suspension Take 5 mLs by mouth 2 (two) times daily. 115 mL 0   . insulin glargine (LANTUS SOLOSTAR) 100 UNIT/ML injection pen Lantus Solostar U-100 Insulin 100 unit/mL (3 mL) subcutaneous pen     . JANUMET 50-1000 MG tablet TAKE 1 TABLET BY MOUTH 2 (TWO) TIMES DAILY WITH MEALS. 180 tablet 3   . niacin (NIASPAN) 500 MG CR tablet niacin ER 500 mg tablet,extended release 24 hr     . SYMBICORT 80-4.5 MCG/ACT inhaler      . zolpidem (AMBIEN) 10 MG tablet  Take 10 mg by mouth nightly as needed for Sleep.       Current Facility-Administered Medications   Medication Dose Route Frequency Provider Last Rate Last Dose   . levalbuterol (XOPENEX) nebulizer solution 1.25 mg  1.25 mg Nebulization TID Zorita Pang, MD           Allergies:  No Known Allergies    Past Medical History:  Past Medical History:   Diagnosis Date   . AICD (automatic cardioverter/defibrillator) present 2014   . CAD (coronary artery disease)    . Cardiac defibrillator in situ    . CHF (congestive heart failure) 08/12/2014   . Diabetes mellitus    . Heart attack 2011    and 2014   . Hyperlipemia    . Hypertension    . Ischemic cardiomyopathy     20%   . Pneumonia 09/2016       Past Surgical History:  Past Surgical History:   Procedure Laterality Date   . CARDIAC CATHETERIZATION     . CARDIAC DEFIBRILLATOR PLACEMENT     . CORONARY STENT PLACEMENT  2011   . duodenal ulcer  1973   . EGD N/A  08/15/2014    Procedure: EGD;  Surgeon: Colon Branch, MD;  Location: [facility];  Service: Gastroenterology;  Laterality: N/A;  egd w/ bx   . HERNIA REPAIR      [facility]   . orthopedic surgery      R foot corrective   . TONSILLECTOMY AND ADENOIDECTOMY  1954       Family History:  Family History   Problem Relation Age of Onset   . Breast cancer Mother    . Heart attack Mother 50   . Diabetes Brother        Social History:  Social History     Social History   . Marital status: Married     Spouse name: Lajoyce Corners   . Number of children: 0   . Years of education: N/A     Occupational History   . 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 Smoking status: Never Smoker   . Smokeless tobacco: Never Used   . Alcohol use No   . Drug use: No   . Sexual activity: Yes     Other Topics Concern   . Not on file     Social History Narrative   . No narrative on file       The following sections were reviewed this encounter by the provider:        Vitals:  BP 107/69   Pulse 91   Temp 98.1 F (36.7 C) (Oral)   Wt 96 kg (211 lb 9.6 oz)   SpO2 100%   BMI 28.70 kg/m         ROS:  Constitutional: Negative for fever, chills and malaise/fatigue.   Respiratory: Negative for cough, shortness of breath and wheezing.    Cardiovascular: Negative for chest pain and palpitations.   Gastrointestinal: Negative for abdominal pain, diarrhea and constipation.   Musculoskeletal: Negative for joint pain.   Skin: Negative for rash.   Neurological: Negative for dizziness and headaches.   Psychiatric/Behavioral: The patient does not have insomnia.         Objective:     Physical Exam:  Constitutional: Patient is oriented to person, place, and time. Appears well-developed and well-nourished.   Head: Normocephalic and atraumatic.   Eyes:  Conjunctivae and EOM are normal. Pupils are equal, round, and reactive to light.   Cardiovascular: Normal rate, regular rhythm and normal heart sounds.  Exam reveals no gallop and no friction  rub.    No murmur heard.  Pulmonary/Chest: Effort normal and breath sounds normal. Has no wheezes.   Musculoskeletal: Normal range of motion. Exhibits no edema or tenderness.   Neurological: Patient is alert and oriented to person, place, and time.   Psychiatric: Patient has a normal mood and affect.        Assessment/Plan:       1. Need for prophylactic vaccination and inoculation against influenza  - Flu Vaccine High Dose, 65 yrs+ ([id number])          Zorita Pang, MD

## 2017-07-23 ENCOUNTER — Encounter (INDEPENDENT_AMBULATORY_CARE_PROVIDER_SITE_OTHER): Payer: Self-pay | Admitting: Family Medicine

## 2017-07-28 ENCOUNTER — Ambulatory Visit (INDEPENDENT_AMBULATORY_CARE_PROVIDER_SITE_OTHER): Payer: Commercial Managed Care - POS

## 2017-07-28 DIAGNOSIS — Z23 Encounter for immunization: Secondary | ICD-10-CM

## 2017-07-28 NOTE — Progress Notes (Signed)
Patient presented to the office for flu shot administration.  Received injection in the Left arm. VIS given to pt. No reaction was noted and patient left in good condition. -Suraya Vidrine, LPN 2

## 2017-08-05 ENCOUNTER — Encounter (INDEPENDENT_AMBULATORY_CARE_PROVIDER_SITE_OTHER): Payer: Self-pay | Admitting: Family Medicine

## 2017-08-09 ENCOUNTER — Encounter (INDEPENDENT_AMBULATORY_CARE_PROVIDER_SITE_OTHER): Payer: Self-pay | Admitting: Family Medicine

## 2017-08-19 DIAGNOSIS — I5021 Acute systolic (congestive) heart failure: Secondary | ICD-10-CM

## 2017-08-19 DIAGNOSIS — A419 Sepsis, unspecified organism: Secondary | ICD-10-CM

## 2017-08-19 HISTORY — DX: Acute systolic (congestive) heart failure: I50.21

## 2017-08-19 HISTORY — DX: Sepsis, unspecified organism: A41.9

## 2017-08-22 ENCOUNTER — Emergency Department: Payer: Commercial Managed Care - POS

## 2017-08-22 ENCOUNTER — Inpatient Hospital Stay
Admission: EM | Admit: 2017-08-22 | Discharge: 2017-08-30 | DRG: 871 | Disposition: A | Discharge status: Skilled Nursing Facility | Payer: Commercial Managed Care - POS | Attending: Internal Medicine | Admitting: Internal Medicine

## 2017-08-22 ENCOUNTER — Ambulatory Visit (INDEPENDENT_AMBULATORY_CARE_PROVIDER_SITE_OTHER): Payer: Self-pay

## 2017-08-22 DIAGNOSIS — Z95810 Presence of automatic (implantable) cardiac defibrillator: Secondary | ICD-10-CM

## 2017-08-22 DIAGNOSIS — T508X5A Adverse effect of diagnostic agents, initial encounter: Secondary | ICD-10-CM | POA: Diagnosis present

## 2017-08-22 DIAGNOSIS — I5043 Acute on chronic combined systolic (congestive) and diastolic (congestive) heart failure: Secondary | ICD-10-CM | POA: Diagnosis present

## 2017-08-22 DIAGNOSIS — A419 Sepsis, unspecified organism: Principal | ICD-10-CM | POA: Diagnosis present

## 2017-08-22 DIAGNOSIS — E785 Hyperlipidemia, unspecified: Secondary | ICD-10-CM | POA: Diagnosis present

## 2017-08-22 DIAGNOSIS — Z7951 Long term (current) use of inhaled steroids: Secondary | ICD-10-CM

## 2017-08-22 DIAGNOSIS — Z833 Family history of diabetes mellitus: Secondary | ICD-10-CM

## 2017-08-22 DIAGNOSIS — R0689 Other abnormalities of breathing: Secondary | ICD-10-CM

## 2017-08-22 DIAGNOSIS — Z955 Presence of coronary angioplasty implant and graft: Secondary | ICD-10-CM

## 2017-08-22 DIAGNOSIS — I252 Old myocardial infarction: Secondary | ICD-10-CM

## 2017-08-22 DIAGNOSIS — Z794 Long term (current) use of insulin: Secondary | ICD-10-CM

## 2017-08-22 DIAGNOSIS — G9341 Metabolic encephalopathy: Secondary | ICD-10-CM | POA: Diagnosis present

## 2017-08-22 DIAGNOSIS — J45901 Unspecified asthma with (acute) exacerbation: Secondary | ICD-10-CM | POA: Diagnosis present

## 2017-08-22 DIAGNOSIS — N183 Chronic kidney disease, stage 3 (moderate): Secondary | ICD-10-CM | POA: Diagnosis present

## 2017-08-22 DIAGNOSIS — J189 Pneumonia, unspecified organism: Secondary | ICD-10-CM

## 2017-08-22 DIAGNOSIS — E1122 Type 2 diabetes mellitus with diabetic chronic kidney disease: Secondary | ICD-10-CM | POA: Diagnosis present

## 2017-08-22 DIAGNOSIS — I255 Ischemic cardiomyopathy: Secondary | ICD-10-CM | POA: Diagnosis present

## 2017-08-22 DIAGNOSIS — J209 Acute bronchitis, unspecified: Secondary | ICD-10-CM | POA: Diagnosis present

## 2017-08-22 DIAGNOSIS — Z7982 Long term (current) use of aspirin: Secondary | ICD-10-CM

## 2017-08-22 DIAGNOSIS — K219 Gastro-esophageal reflux disease without esophagitis: Secondary | ICD-10-CM | POA: Diagnosis present

## 2017-08-22 DIAGNOSIS — I130 Hypertensive heart and chronic kidney disease with heart failure and stage 1 through stage 4 chronic kidney disease, or unspecified chronic kidney disease: Secondary | ICD-10-CM | POA: Diagnosis present

## 2017-08-22 DIAGNOSIS — E872 Acidosis: Secondary | ICD-10-CM | POA: Diagnosis not present

## 2017-08-22 DIAGNOSIS — G4733 Obstructive sleep apnea (adult) (pediatric): Secondary | ICD-10-CM | POA: Diagnosis present

## 2017-08-22 DIAGNOSIS — N141 Nephropathy induced by other drugs, medicaments and biological substances: Secondary | ICD-10-CM | POA: Diagnosis present

## 2017-08-22 DIAGNOSIS — I2510 Atherosclerotic heart disease of native coronary artery without angina pectoris: Secondary | ICD-10-CM | POA: Diagnosis present

## 2017-08-22 DIAGNOSIS — E1165 Type 2 diabetes mellitus with hyperglycemia: Secondary | ICD-10-CM | POA: Diagnosis not present

## 2017-08-22 DIAGNOSIS — N179 Acute kidney failure, unspecified: Secondary | ICD-10-CM | POA: Diagnosis present

## 2017-08-22 DIAGNOSIS — I420 Dilated cardiomyopathy: Secondary | ICD-10-CM | POA: Diagnosis present

## 2017-08-22 DIAGNOSIS — J9601 Acute respiratory failure with hypoxia: Secondary | ICD-10-CM | POA: Diagnosis present

## 2017-08-22 DIAGNOSIS — E875 Hyperkalemia: Secondary | ICD-10-CM | POA: Diagnosis not present

## 2017-08-22 DIAGNOSIS — Z8701 Personal history of pneumonia (recurrent): Secondary | ICD-10-CM

## 2017-08-22 DIAGNOSIS — J470 Bronchiectasis with acute lower respiratory infection: Secondary | ICD-10-CM | POA: Diagnosis present

## 2017-08-22 DIAGNOSIS — Z8711 Personal history of peptic ulcer disease: Secondary | ICD-10-CM

## 2017-08-22 LAB — COMPREHENSIVE METABOLIC PANEL
ALT: 51 U/L (ref 0–55)
AST (SGOT): 39 U/L — ABNORMAL HIGH (ref 5–34)
Albumin/Globulin Ratio: 1.3 (ref 0.9–2.2)
Albumin: 3.9 g/dL (ref 3.5–5.0)
Alkaline Phosphatase: 99 U/L (ref 38–106)
Anion Gap: 13 (ref 5.0–15.0)
BUN: 15 mg/dL (ref 9.0–28.0)
Bilirubin, Total: 1.9 mg/dL — ABNORMAL HIGH (ref 0.2–1.2)
CO2: 19 mEq/L — ABNORMAL LOW (ref 22–29)
Calcium: 9.1 mg/dL (ref 7.9–10.2)
Chloride: 109 mEq/L (ref 100–111)
Creatinine: 1.1 mg/dL (ref 0.7–1.3)
Globulin: 3 g/dL (ref 2.0–3.6)
Glucose: 185 mg/dL — ABNORMAL HIGH (ref 70–100)
Potassium: 5 mEq/L (ref 3.5–5.1)
Protein, Total: 6.9 g/dL (ref 6.0–8.3)
Sodium: 141 mEq/L (ref 136–145)

## 2017-08-22 LAB — URINALYSIS, REFLEX TO MICROSCOPIC EXAM IF INDICATED
Bilirubin, UA: NEGATIVE
Blood, UA: NEGATIVE
Glucose, UA: NEGATIVE
Ketones UA: NEGATIVE
Leukocyte Esterase, UA: NEGATIVE
Nitrite, UA: NEGATIVE
Protein, UR: 30 — AB
Specific Gravity UA: >1.035 — AB (ref 1.001–1.035)
Urine pH: 5 (ref 5.0–8.0)
Urobilinogen, UA: NORMAL mg/dL

## 2017-08-22 LAB — B-TYPE NATRIURETIC PEPTIDE: B-Natriuretic Peptide: 1828 pg/mL — ABNORMAL HIGH (ref 0–100)

## 2017-08-22 LAB — LACTIC ACID, PLASMA
Lactic Acid: 3 mmol/L — ABNORMAL HIGH (ref 0.2–2.0)
Lactic Acid: 4 mmol/L (ref 0.2–2.0)
Lactic Acid: 2.6 mmol/L — ABNORMAL HIGH (ref 0.2–2.0)

## 2017-08-22 LAB — CBC AND DIFFERENTIAL
Hematocrit: 44.6 % (ref 42.0–52.0)
Hgb: 14.3 g/dL (ref 13.0–17.0)
MCH: 30.2 pg (ref 28.0–32.0)
MCHC: 32.1 g/dL (ref 32.0–36.0)
MCV: 94.3 fL (ref 80.0–100.0)
MPV: 12.5 fL — ABNORMAL HIGH (ref 9.4–12.3)
Platelets: 161 10*3/uL (ref 140–400)
RBC: 4.73 10*6/uL (ref 4.70–6.00)
RDW: 16 % — ABNORMAL HIGH (ref 12–15)
WBC: 10.27 10*3/uL (ref 3.50–10.80)

## 2017-08-22 LAB — MAN DIFF ONLY
Atypical Lymphocytes %: 9 %
Atypical Lymphocytes Absolute: 0.92 10*3/uL — ABNORMAL HIGH
Band Neutrophils Absolute: 0.1 10*3/uL (ref 0.00–1.00)
Band Neutrophils: 1 %
Basophils Absolute Manual: 0 10*3/uL (ref 0.00–0.20)
Basophils Manual: 0 %
Eosinophils Absolute Manual: 0 10*3/uL (ref 0.00–0.70)
Eosinophils Manual: 0 %
Lymphocytes Absolute Manual: 4.01 10*3/uL (ref 0.50–4.40)
Lymphocytes Manual: 39 %
Metamyelocytes Absolute: 0.1 10*3/uL — ABNORMAL HIGH
Metamyelocytes: 1 %
Monocytes Absolute: 0 10*3/uL (ref 0.00–1.20)
Monocytes Manual: 0 %
Myelocytes Absolute: 0.1 10*3/uL — ABNORMAL HIGH
Myelocytes: 1 %
Neutrophils Absolute Manual: 5.03 10*3/uL (ref 1.80–8.10)
Segmented Neutrophils: 49 %

## 2017-08-22 LAB — ARTERIAL BLOOD GAS (~~LOC~~)
ARTERIAL PCO2 CORRECTED: 36.9 mmHg (ref 35.0–45.0)
ARTERIAL PH CORRECTED: 7.27 — ABNORMAL LOW (ref 7.35–7.45)
Arterial Total CO2: 18 mEq/L — ABNORMAL LOW (ref 23.0–30.0)
Arterial pO2 Corrected: 57 mmHg — CL (ref 80.0–100.0)
Base Excess, Arterial: -10 mEq/L — ABNORMAL LOW (ref <=2.0)
FIO2: 36 %
HCO3, Arterial: 17.2 mEq/L — ABNORMAL LOW (ref 22.0–28.0)
O2 Flow: 4 L/min
O2 Sat, Arterial: 87 % — ABNORMAL LOW (ref 95.0–100.0)
Temperature: 97.2
pCO2, Arterial: 38.1 mmHg (ref 35.0–45.0)
pH, Arterial: 7.26 — ABNORMAL LOW (ref 7.35–7.45)
pO2, Arterial: 60 mmHg — ABNORMAL LOW (ref 80.0–100.0)

## 2017-08-22 LAB — CELL MORPHOLOGY
Cell Morphology: ABNORMAL — AB
Ovalocytes: 1
Platelet Estimate: NORMAL
Toxic Granulation: 1
Toxic Vacuolation: 1

## 2017-08-22 LAB — MAGNESIUM: Magnesium: 1.9 mg/dL (ref 1.6–2.6)

## 2017-08-22 LAB — PT AND APTT
PT INR: 1.2 — ABNORMAL HIGH (ref 0.9–1.1)
PT: 15.1 s — ABNORMAL HIGH (ref 12.6–15.0)
PTT: 27 s (ref 23–37)

## 2017-08-22 LAB — TROPONIN I
Troponin I: 0.05 ng/mL (ref 0.00–0.09)
Troponin I: 0.04 ng/mL (ref 0.00–0.09)
Troponin I: 0.04 ng/mL (ref 0.00–0.09)

## 2017-08-22 LAB — GFR: EGFR: >60

## 2017-08-22 LAB — LIPASE: Lipase: 32 U/L (ref 8–78)

## 2017-08-22 LAB — PROCALCITONIN: Procalcitonin: 0.12 — ABNORMAL HIGH (ref 0.00–0.10)

## 2017-08-22 LAB — GLUCOSE WHOLE BLOOD - POCT: Whole Blood Glucose POCT: 212 mg/dL — ABNORMAL HIGH (ref 70–100)

## 2017-08-22 MED ORDER — ENOXAPARIN SODIUM 40 MG/0.4ML SC SOLN
40.0000 mg | Freq: Every day | SUBCUTANEOUS | Status: DC
Start: 2017-08-23 — End: 2017-08-24
  Administered 2017-08-23: 40 mg via SUBCUTANEOUS
  Filled 2017-08-22: qty 0.4

## 2017-08-22 MED ORDER — ACETAMINOPHEN 325 MG PO TABS
650.0000 mg | ORAL_TABLET | ORAL | Status: DC | PRN
Start: 2017-08-22 — End: 2017-08-30
  Administered 2017-08-23 – 2017-08-25 (×4): 650 mg via ORAL
  Filled 2017-08-22 (×4): qty 2

## 2017-08-22 MED ORDER — SODIUM CHLORIDE 0.9 % IV BOLUS
1000.0000 mL | Freq: Once | INTRAVENOUS | Status: AC
Start: 2017-08-22 — End: 2017-08-22
  Administered 2017-08-22: 1000 mL via INTRAVENOUS

## 2017-08-22 MED ORDER — ALBUTEROL SULFATE (2.5 MG/3ML) 0.083% IN NEBU
2.5000 mg | INHALATION_SOLUTION | Freq: Once | RESPIRATORY_TRACT | Status: AC
Start: 2017-08-22 — End: 2017-08-22

## 2017-08-22 MED ORDER — CARVEDILOL 12.5 MG PO TABS
25.0000 mg | ORAL_TABLET | Freq: Two times a day (BID) | ORAL | Status: DC
Start: 2017-08-22 — End: 2017-08-25
  Administered 2017-08-22 – 2017-08-23 (×2): 25 mg via ORAL
  Filled 2017-08-22 (×4): qty 2

## 2017-08-22 MED ORDER — NIACIN ER (ANTIHYPERLIPIDEMIC) 500 MG PO TBCR
500.0000 mg | EXTENDED_RELEASE_TABLET | Freq: Every evening | ORAL | Status: DC
Start: 2017-08-22 — End: 2017-08-24
  Administered 2017-08-22 – 2017-08-23 (×2): 500 mg via ORAL
  Filled 2017-08-22 (×3): qty 1

## 2017-08-22 MED ORDER — IOHEXOL 350 MG/ML IV SOLN
100.0000 mL | Freq: Once | INTRAVENOUS | Status: AC | PRN
Start: 2017-08-22 — End: 2017-08-22
  Administered 2017-08-22: 100 mL via INTRAVENOUS

## 2017-08-22 MED ORDER — PANTOPRAZOLE SODIUM 40 MG PO TBEC
40.0000 mg | DELAYED_RELEASE_TABLET | Freq: Every morning | ORAL | Status: DC
Start: 2017-08-23 — End: 2017-08-30
  Administered 2017-08-23 – 2017-08-30 (×8): 40 mg via ORAL
  Filled 2017-08-22 (×8): qty 1

## 2017-08-22 MED ORDER — INSULIN REGULAR HUMAN 100 UNIT/ML IJ SOLN
1.0000 [IU] | Freq: Three times a day (TID) | INTRAMUSCULAR | Status: DC | PRN
Start: 2017-08-22 — End: 2017-08-24
  Administered 2017-08-23: 3 [IU] via SUBCUTANEOUS
  Administered 2017-08-23: 5 [IU] via SUBCUTANEOUS
  Administered 2017-08-23: 3 [IU] via SUBCUTANEOUS
  Filled 2017-08-22: qty 9
  Filled 2017-08-22: qty 15
  Filled 2017-08-22: qty 9
  Filled 2017-08-22: qty 3

## 2017-08-22 MED ORDER — FLUTICASONE FUROATE-VILANTEROL 100-25 MCG/INH IN AEPB
1.0000 | INHALATION_SPRAY | Freq: Every morning | RESPIRATORY_TRACT | Status: DC
Start: 2017-08-22 — End: 2017-08-30
  Administered 2017-08-23 – 2017-08-30 (×7): 1 via RESPIRATORY_TRACT
  Filled 2017-08-22 (×2): qty 14

## 2017-08-22 MED ORDER — GLUCOSE 40 % PO GEL
15.0000 g | ORAL | Status: DC | PRN
Start: 2017-08-22 — End: 2017-08-30

## 2017-08-22 MED ORDER — METHYLPREDNISOLONE SODIUM SUCC 40 MG IJ SOLR
40.0000 mg | Freq: Four times a day (QID) | INTRAMUSCULAR | Status: DC
Start: 2017-08-22 — End: 2017-08-24
  Administered 2017-08-22 – 2017-08-24 (×7): 40 mg via INTRAVENOUS
  Filled 2017-08-22 (×7): qty 1

## 2017-08-22 MED ORDER — METHYLPREDNISOLONE SODIUM SUCC 125 MG IJ SOLR
125.0000 mg | Freq: Once | INTRAMUSCULAR | Status: AC
Start: 2017-08-22 — End: 2017-08-22
  Administered 2017-08-22: 125 mg via INTRAVENOUS
  Filled 2017-08-22: qty 2

## 2017-08-22 MED ORDER — ENOXAPARIN SODIUM 100 MG/ML SC SOLN
100.0000 mg | Freq: Once | SUBCUTANEOUS | Status: AC
Start: 2017-08-22 — End: 2017-08-22
  Administered 2017-08-22: 100 mg via SUBCUTANEOUS
  Filled 2017-08-22: qty 1

## 2017-08-22 MED ORDER — SODIUM CHLORIDE 0.9 % IV MBP
1.0000 g | INTRAVENOUS | Status: DC
Start: 2017-08-22 — End: 2017-08-24
  Administered 2017-08-22 – 2017-08-23 (×2): 1 g via INTRAVENOUS
  Filled 2017-08-22 (×2): qty 1000
  Filled 2017-08-22: qty 100
  Filled 2017-08-22: qty 1000

## 2017-08-22 MED ORDER — DEXTROSE 50 % IV SOLN
12.5000 g | INTRAVENOUS | Status: DC | PRN
Start: 2017-08-22 — End: 2017-08-30

## 2017-08-22 MED ORDER — ACETAMINOPHEN 650 MG RE SUPP
650.0000 mg | RECTAL | Status: DC | PRN
Start: 2017-08-22 — End: 2017-08-30

## 2017-08-22 MED ORDER — AMLODIPINE BESYLATE 5 MG PO TABS
ORAL_TABLET | Freq: Every day | ORAL | Status: DC
Start: 2017-08-22 — End: 2017-08-23
  Filled 2017-08-22 (×4): qty 1

## 2017-08-22 MED ORDER — GLUCAGON 1 MG IJ SOLR (WRAP)
1.0000 mg | INTRAMUSCULAR | Status: DC | PRN
Start: 2017-08-22 — End: 2017-08-30

## 2017-08-22 MED ORDER — SITAGLIPTIN PHOSPHATE 50 MG PO TABS
ORAL_TABLET | Freq: Two times a day (BID) | ORAL | Status: DC
Start: 2017-08-22 — End: 2017-08-23
  Filled 2017-08-22 (×7): qty 1

## 2017-08-22 MED ORDER — INSULIN REGULAR HUMAN 100 UNIT/ML IJ SOLN
1.0000 [IU] | Freq: Every evening | INTRAMUSCULAR | Status: DC | PRN
Start: 2017-08-22 — End: 2017-08-24
  Administered 2017-08-23: 1 [IU] via SUBCUTANEOUS

## 2017-08-22 MED ORDER — SODIUM CHLORIDE 0.9 % IV SOLN
INTRAVENOUS | Status: DC
Start: 2017-08-22 — End: 2017-08-23

## 2017-08-22 MED ORDER — ALBUTEROL SULFATE (2.5 MG/3ML) 0.083% IN NEBU
INHALATION_SOLUTION | RESPIRATORY_TRACT | Status: AC
Start: 2017-08-22 — End: 2017-08-22
  Administered 2017-08-22: 2.5 mg via RESPIRATORY_TRACT
  Filled 2017-08-22: qty 3

## 2017-08-22 MED ORDER — INSULIN GLARGINE 100 UNIT/ML SC SOLN
50.0000 [IU] | Freq: Every evening | SUBCUTANEOUS | Status: DC
Start: 2017-08-22 — End: 2017-08-30
  Administered 2017-08-22 – 2017-08-23 (×2): 50 [IU] via SUBCUTANEOUS
  Administered 2017-08-25: 20 [IU] via SUBCUTANEOUS
  Administered 2017-08-26 – 2017-08-29 (×4): 50 [IU] via SUBCUTANEOUS
  Filled 2017-08-22 (×2): qty 50
  Filled 2017-08-22: qty 20
  Filled 2017-08-22 (×4): qty 50

## 2017-08-22 MED ORDER — LORAZEPAM 2 MG/ML IJ SOLN
0.5000 mg | Freq: Once | INTRAMUSCULAR | Status: AC
Start: 2017-08-22 — End: 2017-08-22
  Administered 2017-08-22: 0.5 mg via INTRAVENOUS
  Filled 2017-08-22: qty 1

## 2017-08-22 MED ORDER — ALBUTEROL-IPRATROPIUM 2.5-0.5 (3) MG/3ML IN SOLN
3.0000 mL | Freq: Four times a day (QID) | RESPIRATORY_TRACT | Status: DC
Start: 2017-08-22 — End: 2017-08-24
  Administered 2017-08-22 – 2017-08-24 (×7): 3 mL via RESPIRATORY_TRACT
  Filled 2017-08-22 (×8): qty 3

## 2017-08-22 MED ORDER — NALOXONE HCL 0.4 MG/ML IJ SOLN (WRAP)
0.2000 mg | INTRAMUSCULAR | Status: DC | PRN
Start: 2017-08-22 — End: 2017-08-30

## 2017-08-22 MED ORDER — AMITRIPTYLINE HCL 25 MG PO TABS
25.0000 mg | ORAL_TABLET | Freq: Every evening | ORAL | Status: DC
Start: 2017-08-22 — End: 2017-08-30
  Administered 2017-08-22 – 2017-08-29 (×8): 25 mg via ORAL
  Filled 2017-08-22 (×2): qty 3
  Filled 2017-08-22 (×4): qty 1
  Filled 2017-08-22: qty 3
  Filled 2017-08-22 (×2): qty 1

## 2017-08-22 MED ORDER — IPRATROPIUM BROMIDE 0.02 % IN SOLN
0.5000 mg | Freq: Once | RESPIRATORY_TRACT | Status: AC
Start: 2017-08-22 — End: 2017-08-22
  Administered 2017-08-22: 0.5 mg via RESPIRATORY_TRACT
  Filled 2017-08-22: qty 2.5

## 2017-08-22 MED ORDER — FUROSEMIDE 10 MG/ML IJ SOLN
40.0000 mg | Freq: Once | INTRAMUSCULAR | Status: AC
Start: 2017-08-22 — End: 2017-08-22
  Administered 2017-08-22: 40 mg via INTRAVENOUS
  Filled 2017-08-22: qty 4

## 2017-08-22 MED ORDER — ONDANSETRON HCL 4 MG/2ML IJ SOLN
4.0000 mg | Freq: Once | INTRAMUSCULAR | Status: AC
Start: 2017-08-22 — End: 2017-08-22
  Administered 2017-08-22: 4 mg via INTRAVENOUS
  Filled 2017-08-22: qty 2

## 2017-08-22 MED ORDER — ASPIRIN 81 MG PO CHEW
81.0000 mg | CHEWABLE_TABLET | Freq: Every day | ORAL | Status: DC
Start: 2017-08-22 — End: 2017-08-24
  Administered 2017-08-22 – 2017-08-23 (×2): 81 mg via ORAL
  Filled 2017-08-22 (×2): qty 1

## 2017-08-22 MED ORDER — AZITHROMYCIN 250 MG PO TABS
500.0000 mg | ORAL_TABLET | ORAL | Status: DC
Start: 2017-08-22 — End: 2017-08-24
  Administered 2017-08-22 – 2017-08-23 (×2): 500 mg via ORAL
  Filled 2017-08-22 (×3): qty 2

## 2017-08-22 MED ORDER — SODIUM CHLORIDE 0.9 % IV MBP
4.5000 g | Freq: Once | INTRAVENOUS | Status: AC
Start: 2017-08-22 — End: 2017-08-22
  Administered 2017-08-22: 4.5 g via INTRAVENOUS
  Filled 2017-08-22: qty 20
  Filled 2017-08-22: qty 100

## 2017-08-22 MED ORDER — ATORVASTATIN CALCIUM 20 MG PO TABS
40.0000 mg | ORAL_TABLET | Freq: Every evening | ORAL | Status: DC
Start: 2017-08-22 — End: 2017-08-30
  Administered 2017-08-22 – 2017-08-29 (×8): 40 mg via ORAL
  Filled 2017-08-22 (×8): qty 2

## 2017-08-22 NOTE — ED Triage Notes (Signed)
Patient arrived by ambulance, patient states he has not been feeling well x 4 days, complains of abdominal pain. Patient states he started having chest pain last night and earlier today, denies now. Patient states he started feeling lightheaded this morning, trouble breathing. EMS reports patient was tachypnic, labored breathing, audible wheezing, room air oxygen was 85%, low BP systolic 85. EMS administered 1 A&A neb. History of CHF, followed by pulmonologist for possible COPD.

## 2017-08-22 NOTE — ED Notes (Signed)
Dr. Bufford Buttner informed that pt is hard IV stick, not able to obtain second set of blood cultures.

## 2017-08-22 NOTE — ED Notes (Signed)
According to previous shift RN Gracelyn Nurse she told the admitting doctor that patient is a hard stick so getting a second set of blood culture will be difficult, said to not to do it anymore if really hard to get bloods

## 2017-08-22 NOTE — ED Notes (Signed)
Received patient from PTS. Patient transferred self to our stretcher. Report given. Patient denies any pain at this time. Patient assisted to standing to use the urinal. Patient placed back in bed. Patient reports increased SOB with exertion. Patient oxygen remains stable.

## 2017-08-22 NOTE — ED Notes (Signed)
CT tech called, MD and RN went over to CT scan and sonosite placed IV in right upper arm now infiltrated. Warm pack applied, IV removed.

## 2017-08-22 NOTE — ED Notes (Signed)
Pt. [name] [name].o male  Dx: Sepsis and pneumonia  Total Care: pt is independent; but orders for bed rest  Isolation: No  Bed: Intermediate Care

## 2017-08-22 NOTE — ED Provider Notes (Addendum)
EMERGENCY DEPARTMENT NOTE    Physician/Midlevel provider first contact with patient: 08/22/17 1141         Dr Karle Plumber  is the primary attending for this patient and has obtained and performed the history, PE, and medical decision making for this patient.    MEDICAL DECISION MAKING/ED COURSE   -->I personally reviewed Nursing Notes, vital signs and pulse oximetry:   -->I personally reviewed previous/outside records as available  -->I independently reviewed and interpreted the EKG:  "Cardiac Studies" below    -->Oxygen Saturation by Pulse Oximetry: 99%  On 4L NC  Indicating low oxygenation;  Interventions needed: Nasal canula    -->Pt seen/examined on arrival to the ED;  --pt with abd pain for few days and now with SOB/Wheezing; pt in moderate resp distress;  --IVF, Lactate, labs, CXR, etc;     --Lactate noted; Pt has SOB and elevated BNP and hence concern that giving IVF at 30cc/kg bolus would be detrimental to the pt's medical condition and would worsen the pt's respiratory status; Pt/family agrees with mgmt and limiting IVF    --Lasix and IV Abx ordered    ED Peripheral IV Note  -Multiple attempts made by RN Staff and No IV Access (for CT Scan)  Indications: CT Scan   Approach:  Under US Guidance  Preparation: Topical alcohol; Sterile technique  Anesthesia: None  Technique: 20G to the right brachial vein, under US guidance   (+)Blood withdrawn and IV flushed with 10cc NS; No infiltration and no pain;   IV checked by myself and by RN and flushed well w/o any pain; Good blood draw back  (+)IV Secured   Post Procedure distal CMS: normal   Patient tolerated procedure: Well     2:12 PM--pt went to CT Chest r/o PE, CT A/P; Informed by the CT Tech that the contrast infiltrated the right upper arm;  I evaluated the patient and agree that the right brachial vein IV no longer working and probably infiltrated; Right upper arm swelling; Normal n/v exam to the RUE; No signs/symptoms of compartment syndrome; Will keep the  arm elevated and start warm compresses; IV Removed; pt and wife informed;     3:23 PM--repeat lactate noted; CTs noted; IV Abx have been ordered;   Entire case d/w Dr Sharma Covert (covering ICU/IMCU) and will admit to the IMCU;  Possibility of PE discussed, but unable to get CT Chest due to IV limitations; Will give dose of Lovenox;  If bed not available in the IMCU, then recommends transfer to [facility] ED so pt can be seen by him;       3:48 PM--I spoke to the Nursing Supervisor and  Will attempt to get ICU/IMCU bed for the patient, but may take some time; Agrees that the pt can be moved from [place] ER to [facility] ER to expedite care;  Case d/w Dr Randa Lynn at [facility] ED      CRITICAL CARE:  65 min     Due to the high risk of critical illness or multi-organ failure at initial presentation and/or during ED course.    System(s) at risk for compromise:  respiratory   Critical Diagnosis:  Sepsis, pneumonia   The patient was Hypotense:  no   The patient was Hypoxic:  yes   This does not including time spent performing other reported procedures or services.   Critical care time involved full attention to the patient's condition and included:     (+)Review of nursing notes and/or old  charts  (+)Review of medications, allergies, and vital signs    (+)Documentation time  (+)Consultant collaboration on findings and treatment options    (+)Care, transfer of care, and discharge plans  (+) Ordering, interpreting, and reviewing diagnostic studies/tab tests    (+)Obtaining necessary history from family, EMS, nursing home staff and/or treating physicians.               MDM  Number of Diagnoses or Management Options  Acute respiratory insufficiency: new, needed workup  Pneumonia of right lower lobe due to infectious organism: new, needed workup  Sepsis, due to unspecified organism: new, needed workup  Diagnosis management comments: Differential diagnosis to include but not limited to: Pneumonia, Bronchitis,  R/O COPD, CHF, Anemia, ACS, MI, PE. PTX,  Pleural Effusion, acute resp insufficiency, abd pain, diverticulitis/perforation, abscess, appendicitis,          Amount and/or Complexity of Data Reviewed  Clinical lab tests: ordered and reviewed  Tests in the radiology section of CPT: ordered and reviewed  Tests in the medicine section of CPT: ordered and reviewed  Obtain history from someone other than the patient: yes (Medics, wife)  Review and summarize past medical records: yes  Discuss the patient with other providers: yes (ICU Doctor)  Independent visualization of images, tracings, or specimens: yes    Risk of Complications, Morbidity, and/or Mortality  Presenting problems: high  Diagnostic procedures: high  Management options: high          Procedures            Diagnosis:  Final diagnoses:   Sepsis, due to unspecified organism   Pneumonia of right lower lobe due to infectious organism   Acute respiratory insufficiency       Disposition:  ED Disposition     ED Disposition Condition Date/Time Comment    Admit  Sun Aug 22, 2017  3:19 PM Admitting Physician: Lynann Bologna [[id number]]   Diagnosis: Sepsis [[id number]]   Estimated Length of Stay: > or = to 2 midnights   Tentative Discharge Plan?: Home or Self Care [1]   Patient Class: Inpatient [101]            Prescriptions:  Patient's Medications   New Prescriptions    No medications on file   Previous Medications    AMITRIPTYLINE (ELAVIL) 25 MG TABLET    amitriptyline 25 mg tablet   Take 1 tablet every day by oral route.    AMLODIPINE-VALSARTAN (EXFORGE) 10-320 MG PER TABLET    amlodipine 10 mg-valsartan 320 mg tablet   Take 1 tablet every day by oral route.    ASPIRIN 81 MG CHEWABLE TABLET    Chew 1 tablet (81 mg total) by mouth daily.    ATORVASTATIN (LIPITOR) 40 MG TABLET    atorvastatin 40 mg tablet   Take 1 tablet every day by oral route.    CARVEDILOL (COREG) 25 MG TABLET    TAKE 1 TABLET BY MOUTH TWICE DAILY WITH MEALS.    ESOMEPRAZOLE (NEXIUM) 40 MG CAPSULE        FLUTICASONE-SALMETEROL (ADVAIR DISKUS)  100-50 MCG/DOSE AEROSOL POWDER, BREATH ACTIVTIVATEDE    Inhale 1 puff into the lungs 2 (two) times daily.    GLUCOSE BLOOD (ONE TOUCH ULTRA TEST) TEST STRIP    1 each by Other route 2 (two) times daily.Use as instructed    HYDROCODONE-CHLORPHENIRAMINE (TUSSIONEX) 10-8 MG/5ML SUSPENSION    Take 5 mLs by mouth 2 (two) times daily.    INSULIN GLARGINE (LANTUS  SOLOSTAR) 100 UNIT/ML INJECTION PEN    Lantus Solostar U-100 Insulin 100 unit/mL (3 mL) subcutaneous pen    JANUMET 50-1000 MG TABLET    TAKE 1 TABLET BY MOUTH 2 (TWO) TIMES DAILY WITH MEALS.    NIACIN (NIASPAN) 500 MG CR TABLET    niacin ER 500 mg tablet,extended release 24 hr    SYMBICORT 80-4.5 MCG/ACT INHALER        ZOLPIDEM (AMBIEN) 10 MG TABLET    Take 10 mg by mouth nightly as needed for Sleep.   Modified Medications    No medications on file   Discontinued Medications    No medications on file           HISTORY OF PRESENT ILLNESS     Translator Used: as per HPI    Chief Complaint: Shortness of Breath; Abdominal Pain; and Chest Pain     Mechanism of Injury:  as per HPI      [name] [name] is a 71 y.o. male who presents to the ED with SOB/Abd pain;  Patient moderate respiratory distress and hence.  History is limited.  Reports that last 4-5 days, he has been having diffuse lower abdominal pain--- points to the periumbilical region.  Over the last few days he has been having trouble breathing, intermittently today started having worsening shortness of breath and hence, EMS was called.  On EMS arrival, patient was to, grunting, respiratory distress, diffuse wheezing, 85 percent on room air; albuterol, Atrovent nebulizers were started and patient was brought to the ER.  Patient denies any recent fevers; (+)cough; no nausea or vomiting.  No diarrhea.  Patient reports that he did have chest pain earlier but that has resolved.    History obtained from: Patient; Wife; Medics   Onset/Duration: 4-5days  Quality: dull  Location: lower abd  Severity:mild to  mod  Aggravating Factors: none  Alleviating Factors: none  Associated Symptoms: SOB     Nursing notes & Vitals from this date of service were reviewed.      PMD:   Zorita Pang, MD  ALL: No Known Allergies      PMH: Confirmed with the patient and updated as below  PSH: Confirmed with the patient and updated as below  Social History: Confirmed with the patient and updated as below      MEDICAL HISTORY     Past Medical History:  Past Medical History:   Diagnosis Date   . AICD (automatic cardioverter/defibrillator) present 2014   . CAD (coronary artery disease)    . Cardiac defibrillator in situ    . CHF (congestive heart failure) 08/12/2014   . Diabetes mellitus    . Heart attack 2011    and 2014   . Hyperlipemia    . Hypertension    . Ischemic cardiomyopathy     20%   . Pneumonia 09/2016       Past Surgical History:  Past Surgical History:   Procedure Laterality Date   . CARDIAC CATHETERIZATION     . CARDIAC DEFIBRILLATOR PLACEMENT     . CORONARY STENT PLACEMENT  2011   . duodenal ulcer  1973   . EGD N/A 08/15/2014    Procedure: EGD;  Surgeon: Colon Branch, MD;  [facility];  Service: Gastroenterology;  Laterality: N/A;  egd w/ bx   . HERNIA REPAIR      [facility]   . orthopedic surgery      R foot corrective   . TONSILLECTOMY  AND ADENOIDECTOMY  1954       Social History:  Social History     Social History   . Marital status: Married     Spouse name: Lajoyce Corners   . Number of children: 0   . Years of education: N/A     Occupational History   . 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 Smoking status: Never Smoker   . Smokeless tobacco: Never Used   . Alcohol use No   . Drug use: No   . Sexual activity: Yes     Other Topics Concern   . Not on file     Social History Narrative   . No narrative on file       Family History:  Family History   Problem Relation Age of Onset   . Breast cancer Mother    . Heart attack Mother 79   . Diabetes Brother        Outpatient Medication:  Previous  Medications    AMITRIPTYLINE (ELAVIL) 25 MG TABLET    amitriptyline 25 mg tablet   Take 1 tablet every day by oral route.    AMLODIPINE-VALSARTAN (EXFORGE) 10-320 MG PER TABLET    amlodipine 10 mg-valsartan 320 mg tablet   Take 1 tablet every day by oral route.    ASPIRIN 81 MG CHEWABLE TABLET    Chew 1 tablet (81 mg total) by mouth daily.    ATORVASTATIN (LIPITOR) 40 MG TABLET    atorvastatin 40 mg tablet   Take 1 tablet every day by oral route.    CARVEDILOL (COREG) 25 MG TABLET    TAKE 1 TABLET BY MOUTH TWICE DAILY WITH MEALS.    ESOMEPRAZOLE (NEXIUM) 40 MG CAPSULE        FLUTICASONE-SALMETEROL (ADVAIR DISKUS) 100-50 MCG/DOSE AEROSOL POWDER, BREATH ACTIVTIVATEDE    Inhale 1 puff into the lungs 2 (two) times daily.    GLUCOSE BLOOD (ONE TOUCH ULTRA TEST) TEST STRIP    1 each by Other route 2 (two) times daily.Use as instructed    HYDROCODONE-CHLORPHENIRAMINE (TUSSIONEX) 10-8 MG/5ML SUSPENSION    Take 5 mLs by mouth 2 (two) times daily.    INSULIN GLARGINE (LANTUS SOLOSTAR) 100 UNIT/ML INJECTION PEN    Lantus Solostar U-100 Insulin 100 unit/mL (3 mL) subcutaneous pen    JANUMET 50-1000 MG TABLET    TAKE 1 TABLET BY MOUTH 2 (TWO) TIMES DAILY WITH MEALS.    NIACIN (NIASPAN) 500 MG CR TABLET    niacin ER 500 mg tablet,extended release 24 hr    SYMBICORT 80-4.5 MCG/ACT INHALER        ZOLPIDEM (AMBIEN) 10 MG TABLET    Take 10 mg by mouth nightly as needed for Sleep.         REVIEW OF SYSTEMS     Review of Systems   Unable to perform ROS: Acuity of condition       PHYSICAL EXAM     ED Triage Vitals [08/22/17 1142]   Enc Vitals Group      BP (!) 126/96      Heart Rate 79      Resp Rate (!) 39      Temp       Temp src       SpO2 99 %      Weight       Height       Head Circumference  Peak Flow       Pain Score 9      Pain Loc       Pain Edu?       Excl. in GC?        Vitals:    08/22/17 1300 08/22/17 1422 08/22/17 1430 08/22/17 1500   BP: (!) 129/98  106/78 114/77   Pulse: 80  78 75   Resp: (!) 43  (!) 32 (!) 29    Temp:  97 F (36.1 C)  97.2 F (36.2 C)   SpO2: 100%  93% 94%         Physical Exam   Constitutional: He is oriented to person, place, and time. He appears distressed (moderate resp distress).   Awake and alert,   Non-toxic; (+) resp distress;   Answers all questions appropriately       HENT:   Head: Normocephalic and atraumatic.   Eyes: Pupils are equal, round, and reactive to light. Conjunctivae and EOM are normal. No scleral icterus.   Neck: Normal range of motion. Neck supple.   Neck supple and no meningismus     Cardiovascular: Normal rate, regular rhythm, normal heart sounds and intact distal pulses.    No murmur heard.  Pulmonary/Chest: Effort normal and breath sounds normal. No stridor. No respiratory distress. He has no wheezes. He has no rales.   Abdominal: Soft. He exhibits no distension. There is no tenderness. There is no rebound and no guarding.   Musculoskeletal: Normal range of motion. He exhibits no edema or tenderness.   No calf tenderness   Neurological: He is alert and oriented to person, place, and time. GCS score is 15.   Skin: Skin is warm and dry. No rash noted. He is not diaphoretic. No erythema. No pallor.   Nursing note and vitals reviewed.            DATA REVIEWED     Vital Signs: Reviewed the patient?s vital signs.   Nursing Notes: Reviewed and utilized available nursing notes.  Medical Records Reviewed: Reviewed available past medical records if available.       CARDIAC STUDIES    The following cardiac studies were independently interpreted by the Emergency Medicine Physician.  For full cardiac study results please see chart.    Cardiac Monitor Strip: Paced;  No ectopy;  Interpreted by Karle Plumber, MD    EKG Interpretation   EKG interpreted by ED physician  Rate: Normal for age.  85bpm  Rhythm: Paced  Axis: Normal for age  PR, QRS and QT intervals:  normal for age and rate  ST Segments: No STEMI; Non-specific T-wave changes;   Impression: Abnormal ECG     Attending : Dr. Karle Plumber      RADIOLOGY IMAGING STUDIES      Ct Chest Wo Contrast    Result Date: 08/22/2017   1. Stable small layering pleural effusions with overlying atelectasis. 2. Numerous prominent mediastinal, axillary and suspect supraclavicular lymph nodes. Attention on follow-up. 3. Subpleural triangular benign-appearing left upper lobe nodule. In a low risk patient no further workup is needed. Mills Koller, MD 08/22/2017 2:24 PM    Ct Abd/pelvis With Iv Contrast Only    Result Date: 08/22/2017   1. Small amount of scattered ascites with no localized inflammation. No bowel obstruction. 2. 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 3. Numerous bilateral renal cysts. Mills Koller, MD 08/22/2017 2:45 PM    Xr  Chest Ap Portable    Result Date: 08/22/2017   Right basilar airspace opacities indicative of atelectasis or pneumonia in the correct clinical setting. Radiographic follow-up to full resolution recommended. Elizebeth Koller, MD 08/22/2017 12:00 PM      LABORATORY RESULTS    Ordered and independently interpreted AVAILABLE laboratory tests. Please see results section in chart for full details.    Results     Procedure Component Value Units Date/Time    Arterial Blood Gas (ABG) [[id number]]  (Abnormal) Collected:  08/22/17 1451     Updated:  08/22/17 1520     Status Oxygen     ABG CollectionSite " RIGHT WRIST"     Allen's Test Yes     Temperature 97.2     ARTERIAL PH CORRECTED 7.27 (L)     ARTERIAL PCO2 CORRECTED 36.9 mmHg      Arterial pO2 Corrected 57.0 (LL) mmHg      pH, Arterial 7.26 (L)     pCO2, Arterial 38.1 mmHg      pO2, Arterial 60.0 (L) mmHg      HCO3, Arterial 17.2 (L) mEq/L      Base Excess, Arterial -10.0 (L) mEq/L      O2 Sat, Arterial 87.0 (L) %      Arterial Total CO2 18.0 (L) mEq/L      FIO2 36.0 %      O2 Delivery Nasal Cannula     O2 Flow 4.0 L/min     Lactic acid, plasma [[id number]]  (Abnormal) Collected:  08/22/17 1433    Specimen:  Blood Updated:  08/22/17 1451     Lactic acid  4.0 (HH) mmol/L     Narrative:       Cancel if the initial lactate level is < 2.0 mmol/L.    Blood Culture Aerobic/Anaerobic #1 [[id number]] Collected:  08/22/17 1433    Specimen:  Arm from Blood, Intravenous Line Updated:  08/22/17 1433    Narrative:       1 BLUE+1 PURPLE    B-type Natriuretic Peptide [[id number]]  (Abnormal) Collected:  08/22/17 1155    Specimen:  Blood Updated:  08/22/17 1255     B-Natriuretic Peptide 1,828 (H) pg/mL     Lactic acid, plasma [[id number]]  (Abnormal) Collected:  08/22/17 1212    Specimen:  Blood Updated:  08/22/17 1237     Lactic acid 3.0 (H) mmol/L     Narrative:       Cancel if the initial lactate level is < 2.0 mmol/L.    Comprehensive metabolic panel [[id number]]  (Abnormal) Collected:  08/22/17 1155    Specimen:  Blood Updated:  08/22/17 1231     Glucose 185 (H) mg/dL      BUN 03.4 mg/dL      Creatinine 1.1 mg/dL      Sodium 742 mEq/L      Potassium 5.0 mEq/L      Chloride 109 mEq/L      CO2 19 (L) mEq/L      Calcium 9.1 mg/dL      Protein, Total 6.9 g/dL      Albumin 3.9 g/dL      AST (SGOT) 39 (H) U/L      ALT 51 U/L      Alkaline Phosphatase 99 U/L      Bilirubin, Total 1.9 (H) mg/dL      Globulin 3.0 g/dL      Albumin/Globulin Ratio 1.3     Anion Gap 13.0    Magnesium [  [id number]] Collected:  08/22/17 1155    Specimen:  Blood Updated:  08/22/17 1231     Magnesium 1.9 mg/dL     Lipase [[id number]] Collected:  08/22/17 1155    Specimen:  Blood Updated:  08/22/17 1231     Lipase 32 U/L     GFR [[id number]] Collected:  08/22/17 1155     Updated:  08/22/17 1231     EGFR >60.0    Manual Differential [[id number]]  (Abnormal) Collected:  08/22/17 1155     Updated:  08/22/17 1231     Segmented Neutrophils 49 %      Band Neutrophils 1 %      Lymphocytes Manual 39 %      Monocytes Manual 0 %      Eosinophils Manual 0 %      Basophils Manual 0 %      Metamyelocytes 1 %      Myelocytes 1 %      Atypical Lymph % 9 %      Abs Seg Manual 5.03 x10 3/uL      Bands Absolute 0.10 x10 3/uL       Absolute Lymph Manual 4.01 x10 3/uL      Monocytes Absolute 0.00 x10 3/uL      Absolute Eos Manual 0.00 x10 3/uL      Absolute Baso Manual 0.00 x10 3/uL      Metamyelocytes Absolute 0.10 (H) x10 3/uL      Absolute Myelocyte 0.10 (H) x10 3/uL      Atypical Lymph Absolute 0.92 (H) x10 3/uL     Cell MorpHology [[id number]]  (Abnormal) Collected:  08/22/17 1155     Updated:  08/22/17 1231     Cell Morphology: Abnormal (A)     Platelet Estimate Normal     Macrocytic 1+ (A)     Ovalocytes 1     Toxic Granulation 1     Toxic Vacuolation 1    CBC and differential [[id number]]  (Abnormal) Collected:  08/22/17 1155    Specimen:  Blood from Blood Updated:  08/22/17 1231     WBC 10.27 x10 3/uL      Hgb 14.3 g/dL      Hematocrit 74.2 %      Platelets 161 x10 3/uL      RBC 4.73 x10 6/uL      MCV 94.3 fL      MCH 30.2 pg      MCHC 32.1 g/dL      RDW 16 (H) %      MPV 12.5 (H) fL      Nucleated RBC Unmeasured /100 WBC      Absolute NRBC Unmeasured x10 3/uL     Troponin I [[id number]] Collected:  08/22/17 1155    Specimen:  Blood Updated:  08/22/17 1228     Troponin I 0.05 ng/mL     PT/APTT [[id number]]  (Abnormal) Collected:  08/22/17 1155     Updated:  08/22/17 1212     PT 15.1 (H) sec      PT INR 1.2 (H)     PT Anticoag. Given Within 48 hrs. None     PTT 27 sec               EMERGENCY DEPT. MEDICATIONS      ED Medication Orders     Start Ordered     Status Ordering Provider    08/22/17 1545 08/22/17 1531  albuterol (PROVENTIL) nebulizer solution 2.5 mg  RT - Once     Route: Nebulization  Ordered Dose: 2.5 mg     Last MAR action:  Given Peggie Hornak H    08/22/17 1545 08/22/17 1531  ipratropium (ATROVENT) 0.02 % nebulizer solution 0.5 mg  RT - Once     Route: Nebulization  Ordered Dose: 0.5 mg     Last MAR action:  Given Denni France H    08/22/17 1530 08/22/17 1522  enoxaparin (LOVENOX) syringe 100 mg  Once     Route: Subcutaneous  Ordered Dose: 100 mg     Last MAR action:  Given Zeev Deakins H    08/22/17 1530 08/22/17 1524   sodium chloride 0.9 % bolus 1,000 mL  Once     Route: Intravenous  Ordered Dose: 1,000 mL     Last MAR action:  New Bag Britlyn Martine H    08/22/17 1345 08/22/17 1330  furosemide (LASIX) injection 40 mg  Once     Route: Intravenous  Ordered Dose: 40 mg     Last MAR action:  Given Bevan Disney H    08/22/17 1330 08/22/17 1329  ondansetron (ZOFRAN) injection 4 mg  Once     Route: Intravenous  Ordered Dose: 4 mg     Last MAR action:  Given Wheeler Incorvaia H    08/22/17 1330 08/22/17 1329  piperacillin-tazobactam (ZOSYN) 4.5 g in sodium chloride 0.9 % 100 mL IVPB mini-bag plus  Once     Route: Intravenous  Ordered Dose: 4.5 g     Last MAR action:  Stopped Shavonn Convey H    08/22/17 1200 08/22/17 1158  methylPREDNISolone sodium succinate (Solu-MEDROL) injection 125 mg  Once     Route: Intravenous  Ordered Dose: 125 mg     Last MAR action:  Given Navon Kotowski H    08/22/17 1200 08/22/17 1158  LORazepam (ATIVAN) injection 0.5 mg  Once     Route: Intravenous  Ordered Dose: 0.5 mg     Last MAR action:  Given Elyjah Hazan H             *This note was generated by the Epic EMR system/ Dragon speech recognition and may contain inherent errors or omissions not intended by the user. Grammatical errors, random word insertions, deletions, pronoun errors and incomplete sentences are occasional consequences of this technology due to software limitations. Not all errors are caught or corrected. If there are questions or concerns about the content of this note or information contained within the body of this dictation they should be addressed directly with the author for clarification.       Horatio Pel, MD  08/22/17 1534       Horatio Pel, MD  08/22/17 [phone]

## 2017-08-22 NOTE — Plan of Care (Signed)
Ask3Teach3 Program    Education about New Medications and their Side effects    Dear [name] [name],    Its been a pleasure taking care of you during your hospitalization here at [facility]. We have initiated a new program to educate our patients and/or their family members or designated personnel about the new medications started by your physicians and their indications along with the possible side effects. Multiple studies have shown that patients started on new medications are often unaware of the names of the medication along with the indications and their side effects which leads to decreased compliance with the medications.    During our conversation today on 08/22/2017  8:29 PM I have explained to you the name of the new medication and the indication along with some possible common side effects. Listed below are some of the new medications started during this hospitalization.     Please call the Nurse if you have any side effects while in hospital.     Please call 911 if you have any life threatening symptoms after you are discharged from the hospital.    Please inform your Primary care physician for common side effects which are not life threatening after discharge.    Medication:Albuterol(Ventolin/Proventil)   This Medication is used for:   COPD   Asthma   Bronchospasm    Common Side Effects are:   Headache   Tremor   Nervousness   Sleeplessness    A note from your nurse:  Call your nurse immediately if you notice itching, hives, swelling or trouble breathing     Assessment & Plan      Thank you for your time.    Bonnita Hollow, RT  08/22/2017  8:29 PM  [facility]  [address]  [place], [place]  [address]

## 2017-08-22 NOTE — ED Notes (Signed)
Dx: Sepsis, PNA, IMC, no isol, mod assist

## 2017-08-22 NOTE — Plan of Care (Signed)
Problem: Safety  Goal: Patient will be free from injury during hospitalization  Outcome: Progressing  Goal: Patient will be free from infection during hospitalization  Outcome: Progressing     Problem: Pain  Goal: Pain at adequate level as identified by patient  Outcome: Progressing     Problem: Side Effects from Pain Analgesia  Goal: Patient will experience minimal side effects of analgesic therapy  Outcome: Progressing     Problem: Psychosocial and Spiritual Needs  Goal: Demonstrates ability to cope with hospitalization/illness  Outcome: Progressing     Problem: Moderate/High Fall Risk Score >5  Goal: Patient will remain free of falls  Outcome: Progressing

## 2017-08-22 NOTE — H&P (Signed)
[facility]- Critical Care Note      ADMISSION- HISTORY AND PHYSICAL EXAM      Date Time: 08/22/17 6:27 PM  Patient Name: [name] [name]  Attending Physician: Lynann Bologna, MD  Primary Care Physician: Zorita Pang, MD  Location/Room: 09/09     I have reviewed the flowsheet and notes. Events, vitals, medications and notes from last 24 hours reviewed. Care plan discussed with staff    IMPRESSION and PLAN:  Problem List:   Patient Active Problem List   Diagnosis   . Chest pain   . Diabetes mellitus   . Heart attack   . CAD (coronary artery disease)   . Non-ischemic cardiomyopathy   . Acute exacerbation of CHF (congestive heart failure)   . PUD (peptic ulcer disease)   . Elevated LFTs   . Acute dyspnea   . Elevated d-dimer   . CHF (congestive heart failure)   . Headaches due to old head injury   . Essential hypertension   . Pneumonia due to infectious organism, unspecified laterality, unspecified part of lung   . Syncope and collapse   . AICD (automatic cardioverter/defibrillator) present   . Syncope, unspecified syncope type   . Pneumonia due to infectious organism   . Dyspnea, unspecified type   . Cardiomyopathy   . Cough   . Sepsis     Acute resp failure with hypoxia - Titrate FiO2 to keep Sat >90%  Ac exac of Asthma/RAD - Start solumedrol, duonebs, Breo  Poss RLL Pneumonia - Pt's LA is elevated. He was not given fluid bolus due to his hx of severe cardiomyopathy. Plus pt's WBC count is also normal. Will send Procalcitonin level. 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 Diurese with lasix carefully. Check Troponin I panel  Hx of HTN - Cont Norvasc, Coreg  Hx of DM - Cont his home meds Lantus, Januvia. Add ISS  Dyslipedemia - Cont Lipitor  Hx of CAD s/p PCI and stent - Cont home meds, check troponin I  Hx of GERD - PPI  DVT proph - Lovenox sq    Code status: Full    Quality Care: Stress ulcer  prophylaxis, DVT prophylaxis, HOB elevated, Infection control all reviewed and addressed.  Events and notes from last 24 hours reviewed. Care plan discussed with nursing.  D/w patient: above medical problems and answered all questions to his satisfaction.   See my orders for detail.     CC TIME: >45 min   Further management depending on test results and work up as outlined above.    History of Present Illness:    [name] [name] has been seen and evaluated in ER.  Patient is a 71 y.o. male with PMHx significant for CAD, CHF, DM, HTN who comes in with c/o dyspnea for past 4 days. Pt reports he is dyspneic with minimal exertion. NO c/o chest pain, cough, fever, chills, sick contacts    Review of Systems:  HEENT: No epistaxis, no nasal drainage  Respiratory: as above  Cardiovascular: no chest pain, no palpitations  Gastrointestinal: no abd pain, no vomiting, no diarrhea  Genitourinary: No urinary symptoms  Musculoskeletal: Joint pain  Neurological: No focal weakness, no seizures, no headaches  Constitutional: No fever, no chills  Skin: no rash    Medication Reviewed    No Known Allergies   Past Medical History:  Diagnosis Date   . AICD (automatic cardioverter/defibrillator) present 2014   . CAD (coronary artery disease)    . Cardiac defibrillator in situ    . CHF (congestive heart failure) 08/12/2014   . Diabetes mellitus    . Heart attack 2011    and 2014   . Hyperlipemia    . Hypertension    . Ischemic cardiomyopathy     20%   . Pneumonia 09/2016      Past Surgical History:   Procedure Laterality Date   . CARDIAC CATHETERIZATION     . CARDIAC DEFIBRILLATOR PLACEMENT     . CORONARY STENT PLACEMENT  2011   . duodenal ulcer  1973   . EGD N/A 08/15/2014    Procedure: EGD;  Surgeon: Colon Branch, MD;  Location: [facility];  Service: Gastroenterology;  Laterality: N/A;  egd w/ bx   . HERNIA REPAIR      [facility]   . orthopedic surgery      R foot corrective   . TONSILLECTOMY AND ADENOIDECTOMY  1954      Social  History   Substance Use Topics   . Smoking status: Never Smoker   . Smokeless tobacco: Never Used   . Alcohol use No      Family History   Problem Relation Age of Onset   . Breast cancer Mother    . Heart attack Mother 60   . Diabetes Brother       Prior to Admission medications    Medication Sig Start Date End Date Taking? Authorizing Provider   amitriptyline (ELAVIL) 25 MG tablet amitriptyline 25 mg tablet   Take 1 tablet every day by oral route. 06/10/17   [provider]   amlodipine-valsartan (EXFORGE) 10-320 MG per tablet amlodipine 10 mg-valsartan 320 mg tablet   Take 1 tablet every day by oral route. 07/15/17   Zorita Pang, MD   aspirin 81 MG chewable tablet Chew 1 tablet (81 mg total) by mouth daily. 12/30/13   McCoy, Dorice Lamas, NP   atorvastatin (LIPITOR) 40 MG tablet atorvastatin 40 mg tablet   Take 1 tablet every day by oral route. 06/10/17   [provider]   carvedilol (COREG) 25 MG tablet TAKE 1 TABLET BY MOUTH TWICE DAILY WITH MEALS. 06/08/17   Zorita Pang, MD   esomeprazole (NEXIUM) 40 MG capsule  06/14/17   [provider]   fluticasone-salmeterol (ADVAIR DISKUS) 100-50 MCG/DOSE Aerosol Powder, Breath Activtivatede Inhale 1 puff into the lungs 2 (two) times daily. 02/10/17   Zorita Pang, MD   glucose blood (ONE TOUCH ULTRA TEST) test strip 1 each by Other route 2 (two) times daily.Use as instructed 08/15/16   Zorita Pang, MD   hydrocodone-chlorpheniramine (TUSSIONEX) 10-8 MG/5ML suspension Take 5 mLs by mouth 2 (two) times daily. 07/12/17   Zorita Pang, MD   insulin glargine (LANTUS SOLOSTAR) 100 UNIT/ML injection pen Lantus Solostar U-100 Insulin 100 unit/mL (3 mL) subcutaneous pen 06/10/17   [provider]   JANUMET 50-1000 MG tablet TAKE 1 TABLET BY MOUTH 2 (TWO) TIMES DAILY WITH MEALS. 11/22/16   Zorita Pang, MD   niacin (NIASPAN) 500 MG CR tablet niacin ER 500 mg tablet,extended release 24 hr    [provider]   SYMBICORT 80-4.5  MCG/ACT inhaler  07/02/17   [provider]   zolpidem (AMBIEN) 10 MG tablet Take 10 mg by mouth nightly as needed for Sleep.    [provider]  Current Facility-Administered Medications   Medication Dose Route Frequency       Peripheral Intravenous Line:  Peripheral IV 08/22/17 Right Hand (Active)   Site Assessment Clean;Dry;Intact 08/22/2017 12:42 PM   Line Status Saline Locked 08/22/2017 12:42 PM   Dressing Status Clean;Dry;Intact 08/22/2017 12:42 PM     Objective:  Vital Signs:    BP (!) 131/105   Pulse 89   Temp 97.2 F (36.2 C)   Resp (!) 25   Wt 100.1 kg (220 lb 10.9 oz)   SpO2 99%   BMI 29.93 kg/m       Prehospital Care  O2 Device: Venturi mask  FiO2: 40 %  O2 Flow Rate (L/min): 8 L/min  Temp (24hrs), Avg:97.1 F (36.2 C), Min:97 F (36.1 C), Max:97.2 F (36.2 C)    Physical Exam:   General/Neurology: Alert, Awake,   Head:   Normocephalic, without obvious abnormality  Eye:   PERRL, EOM intact, no scleral icterus, no pallor  Oral:   Mucus membranes moist  Neck:   Supple  Lung:   B/l diffuse rhonchi, wheeze present  Heart:   Regular rate & rhythm. S1 S2 present.  Abdomen: Soft, non tender, BS+nt  Extremities:  No pedal edema  Skin:   Dry, intact    Data:      Recent Results (from the past 24 hour(s))   B-type Natriuretic Peptide    Collection Time: 08/22/17 11:55 AM   Result Value Ref Range    B-Natriuretic Peptide 1,828 (H) 0 - 100 pg/mL   CBC and differential    Collection Time: 08/22/17 11:55 AM   Result Value Ref Range    WBC 10.27 3.50 - 10.80 x10 3/uL    Hgb 14.3 13.0 - 17.0 g/dL    Hematocrit 16.1 09.6 - 52.0 %    Platelets 161 140 - 400 x10 3/uL    RBC 4.73 4.70 - 6.00 x10 6/uL    MCV 94.3 80.0 - 100.0 fL    MCH 30.2 28.0 - 32.0 pg    MCHC 32.1 32.0 - 36.0 g/dL    RDW 16 (H) 12 - 15 %    MPV 12.5 (H) 9.4 - 12.3 fL    Nucleated RBC Unmeasured 0.0 - 1.0 /100 WBC    Absolute NRBC Unmeasured 0 x10 3/uL   Comprehensive metabolic panel    Collection Time: 08/22/17 11:55 AM    Result Value Ref Range    Glucose 185 (H) 70 - 100 mg/dL    BUN 04.5 9.0 - 40.9 mg/dL    Creatinine 1.1 0.7 - 1.3 mg/dL    Sodium 811 914 - 782 mEq/L    Potassium 5.0 3.5 - 5.1 mEq/L    Chloride 109 100 - 111 mEq/L    CO2 19 (L) 22 - 29 mEq/L    Calcium 9.1 7.9 - 10.2 mg/dL    Protein, Total 6.9 6.0 - 8.3 g/dL    Albumin 3.9 3.5 - 5.0 g/dL    AST (SGOT) 39 (H) 5 - 34 U/L    ALT 51 0 - 55 U/L    Alkaline Phosphatase 99 38 - 106 U/L    Bilirubin, Total 1.9 (H) 0.2 - 1.2 mg/dL    Globulin 3.0 2.0 - 3.6 g/dL    Albumin/Globulin Ratio 1.3 0.9 - 2.2    Anion Gap 13.0 5.0 - 15.0   Magnesium    Collection Time: 08/22/17 11:55 AM   Result Value Ref Range    Magnesium  1.9 1.6 - 2.6 mg/dL   PT/APTT    Collection Time: 08/22/17 11:55 AM   Result Value Ref Range    PT 15.1 (H) 12.6 - 15.0 sec    PT INR 1.2 (H) 0.9 - 1.1    PT Anticoag. Given Within 48 hrs. None     PTT 27 23 - 37 sec   Troponin I    Collection Time: 08/22/17 11:55 AM   Result Value Ref Range    Troponin I 0.05 0.00 - 0.09 ng/mL   Lipase    Collection Time: 08/22/17 11:55 AM   Result Value Ref Range    Lipase 32 8 - 78 U/L   GFR    Collection Time: 08/22/17 11:55 AM   Result Value Ref Range    EGFR >60.0    Manual Differential    Collection Time: 08/22/17 11:55 AM   Result Value Ref Range    Segmented Neutrophils 49 None %    Band Neutrophils 1 None %    Lymphocytes Manual 39 None %    Monocytes Manual 0 None %    Eosinophils Manual 0 None %    Basophils Manual 0 None %    Metamyelocytes 1 None %    Myelocytes 1 None %    Atypical Lymph % 9 None %    Abs Seg Manual 5.03 1.80 - 8.10 x10 3/uL    Bands Absolute 0.10 0.00 - 1.00 x10 3/uL    Absolute Lymph Manual 4.01 0.50 - 4.40 x10 3/uL    Monocytes Absolute 0.00 0.00 - 1.20 x10 3/uL    Absolute Eos Manual 0.00 0.00 - 0.70 x10 3/uL    Absolute Baso Manual 0.00 0.00 - 0.20 x10 3/uL    Metamyelocytes Absolute 0.10 (H) 0 x10 3/uL    Absolute Myelocyte 0.10 (H) 0 x10 3/uL    Atypical Lymph Absolute 0.92 (H) 0 x10 3/uL    Cell MorpHology    Collection Time: 08/22/17 11:55 AM   Result Value Ref Range    Cell Morphology: Abnormal (A)     Platelet Estimate Normal     Macrocytic 1+ (A)     Ovalocytes 1     Toxic Granulation 1     Toxic Vacuolation 1    Lactic acid, plasma    Collection Time: 08/22/17 12:12 PM   Result Value Ref Range    Lactic acid 3.0 (H) 0.2 - 2.0 mmol/L   Lactic acid, plasma    Collection Time: 08/22/17  2:33 PM   Result Value Ref Range    Lactic acid 4.0 (HH) 0.2 - 2.0 mmol/L   Arterial Blood Gas (ABG)    Collection Time: 08/22/17  2:51 PM   Result Value Ref Range    Status Oxygen     ABG CollectionSite " RIGHT WRIST"     Allen's Test Yes     Temperature 97.2     ARTERIAL PH CORRECTED 7.27 (L) 7.35 - 7.45    ARTERIAL PCO2 CORRECTED 36.9 35.0 - 45.0 mmHg    Arterial pO2 Corrected 57.0 (LL) 80.0 - 100.0 mmHg    pH, Arterial 7.26 (L) 7.35 - 7.45    pCO2, Arterial 38.1 35.0 - 45.0 mmHg    pO2, Arterial 60.0 (L) 80.0 - 100.0 mmHg    HCO3, Arterial 17.2 (L) 22.0 - 28.0 mEq/L    Base Excess, Arterial -10.0 (L) -2.0 - 2.0 mEq/L    O2 Sat, Arterial 87.0 (L) 95.0 - 100.0 %  Arterial Total CO2 18.0 (L) 23.0 - 30.0 mEq/L    FIO2 36.0 %    O2 Delivery Nasal Cannula     O2 Flow 4.0 L/min   ECG 12 Lead    Collection Time: 08/22/17 11:46 PM   Result Value Ref Range    Ventricular Rate 85 BPM    Atrial Rate 85 BPM    P-R Interval 146 ms    QRS Duration 84 ms    Q-T Interval 402 ms    QTC Calculation (Bezet) 478 ms    P Axis 83 degrees    R Axis 25 degrees    T Axis 113 degrees         Chemistry Recent Labs      08/22/17   1155   Glucose  185*   Sodium  141   Potassium  5.0   Chloride  109   CO2  19*   BUN  15.0   Calcium  9.1   Magnesium  1.9   Albumin  3.9   Globulin  3.0       CBC w/Diff Recent Labs      08/22/17   1155   WBC  10.27   RBC  4.73   Hgb  14.3   Hematocrit  44.6   Platelets  161       ABG    Recent Labs  Lab 08/22/17  1451   ABG CollectionSite " RIGHT WRIST"   Allen's Test Yes   Temperature 97.2   Arterial pO2  Corrected 57.0*   pH, Arterial 7.26*   pCO2, Arterial 38.1   HCO3, Arterial 17.2*   Base Excess, Arterial -10.0*   O2 Sat, Arterial 87.0*   FIO2 36.0   O2 Delivery Nasal Cannula     Recent Labs      08/22/17   1451   FIO2  36.0       Micro   Invalid input(s): SDES, CULT  Invalid input(s): CULT    Ct Chest Wo Contrast    Result Date: 08/22/2017   1. Stable small layering pleural effusions with overlying atelectasis. 2. Numerous prominent mediastinal, axillary and suspect supraclavicular lymph nodes. Attention on follow-up. 3. Subpleural triangular benign-appearing left upper lobe nodule. In a low risk patient no further workup is needed. Mills Koller, MD 08/22/2017 2:24 PM    Ct Abd/pelvis With Iv Contrast Only    Result Date: 08/22/2017   1. Small amount of scattered ascites with no localized inflammation. No bowel obstruction. 2. 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 3. Numerous bilateral renal cysts. Mills Koller, MD 08/22/2017 2:45 PM    Xr Chest Ap Portable    Result Date: 08/22/2017   Right basilar airspace opacities indicative of atelectasis or pneumonia in the correct clinical setting. Radiographic follow-up to full resolution recommended. Elizebeth Koller, MD 08/22/2017 12:00 PM      XR (Most Recent). CXR reviewed by me and compared with previous CXR     High complexity decision making was performed during the evaluation of this patient at high risk for decompensation with multiple organ involvement     Above mentioned total time spent on reviewing the case/medical record/data/notes/EMR/patient examination/documentation/coordinating care with nurse/consultants, exclusive of procedures with complex decision making performed and > 50% time spent in face to face evaluation.    Lynann Bologna  08/22/2017

## 2017-08-22 NOTE — ED Notes (Signed)
Pt stuck multiple times to try and place IV for CT angio, Dr. Bufford Buttner finally placed a 20 g in right upper arm.

## 2017-08-23 ENCOUNTER — Inpatient Hospital Stay: Payer: Commercial Managed Care - POS

## 2017-08-23 ENCOUNTER — Ambulatory Visit (INDEPENDENT_AMBULATORY_CARE_PROVIDER_SITE_OTHER): Payer: Commercial Managed Care - POS | Admitting: Family Medicine

## 2017-08-23 LAB — ECG 12-LEAD
Atrial Rate: 71 {beats}/min
Atrial Rate: 85 {beats}/min
P Axis: 63 degrees
P Axis: 83 degrees
P-R Interval: 144 ms
P-R Interval: 146 ms
Q-T Interval: 402 ms
Q-T Interval: 428 ms
QRS Duration: 84 ms
QRS Duration: 86 ms
QTC Calculation (Bezet): 465 ms
QTC Calculation (Bezet): 478 ms
R Axis: 25 degrees
R Axis: 9 degrees
T Axis: 113 degrees
T Axis: 115 degrees
Ventricular Rate: 71 {beats}/min
Ventricular Rate: 85 {beats}/min

## 2017-08-23 LAB — CBC AND DIFFERENTIAL
Absolute NRBC: 0.02 10*3/uL — ABNORMAL HIGH
Basophils Absolute Automated: 0.01 10*3/uL (ref 0.00–0.20)
Basophils Automated: 0.1 %
Eosinophils Absolute Automated: 0 10*3/uL (ref 0.00–0.70)
Eosinophils Automated: 0 %
Hematocrit: 44.4 % (ref 42.0–52.0)
Hgb: 13.8 g/dL (ref 13.0–17.0)
Immature Granulocytes Absolute: 0.05 10*3/uL
Immature Granulocytes: 0.5 %
Lymphocytes Absolute Automated: 4.02 10*3/uL (ref 0.50–4.40)
Lymphocytes Automated: 37.9 %
MCH: 30.3 pg (ref 28.0–32.0)
MCHC: 31.1 g/dL — ABNORMAL LOW (ref 32.0–36.0)
MCV: 97.6 fL (ref 80.0–100.0)
MPV: 12.4 fL — ABNORMAL HIGH (ref 9.4–12.3)
Monocytes Absolute Automated: 0.25 10*3/uL (ref 0.00–1.20)
Monocytes: 2.4 %
Neutrophils Absolute: 6.27 10*3/uL (ref 1.80–8.10)
Neutrophils: 59.1 %
Nucleated RBC: 0.2 /100 WBC (ref 0.0–1.0)
Platelets: 147 10*3/uL (ref 140–400)
RBC: 4.55 10*6/uL — ABNORMAL LOW (ref 4.70–6.00)
RDW: 16 % — ABNORMAL HIGH (ref 12–15)
WBC: 10.6 10*3/uL (ref 3.50–10.80)

## 2017-08-23 LAB — URINALYSIS WITH MICROSCOPIC
Bilirubin, UA: NEGATIVE
Glucose, UA: 50 — AB
Ketones UA: NEGATIVE
Leukocyte Esterase, UA: NEGATIVE
Nitrite, UA: NEGATIVE
Protein, UR: 100 — AB
Specific Gravity UA: >1.035 — AB (ref 1.001–1.035)
Urine pH: 5 (ref 5.0–8.0)
Urobilinogen, UA: NORMAL mg/dL

## 2017-08-23 LAB — GLUCOSE WHOLE BLOOD - POCT
Whole Blood Glucose POCT: 241 mg/dL — ABNORMAL HIGH (ref 70–100)
Whole Blood Glucose POCT: 262 mg/dL — ABNORMAL HIGH (ref 70–100)
Whole Blood Glucose POCT: 226 mg/dL — ABNORMAL HIGH (ref 70–100)
Whole Blood Glucose POCT: 190 mg/dL — ABNORMAL HIGH (ref 70–100)

## 2017-08-23 LAB — COMPREHENSIVE METABOLIC PANEL
ALT: 61 U/L — ABNORMAL HIGH (ref 0–55)
AST (SGOT): 54 U/L — ABNORMAL HIGH (ref 5–34)
Albumin/Globulin Ratio: 1.4 (ref 0.9–2.2)
Albumin: 2.8 g/dL — ABNORMAL LOW (ref 3.5–5.0)
Alkaline Phosphatase: 69 U/L (ref 38–106)
Anion Gap: 11 (ref 5.0–15.0)
BUN: 25 mg/dL (ref 9.0–28.0)
Bilirubin, Total: 1.2 mg/dL (ref 0.2–1.2)
CO2: 14 mEq/L — ABNORMAL LOW (ref 22–29)
Calcium: 7 mg/dL — ABNORMAL LOW (ref 7.9–10.2)
Chloride: 115 mEq/L — ABNORMAL HIGH (ref 100–111)
Creatinine: 1.8 mg/dL — ABNORMAL HIGH (ref 0.7–1.3)
Globulin: 2 g/dL (ref 2.0–3.6)
Glucose: 206 mg/dL — ABNORMAL HIGH (ref 70–100)
Potassium: 4.9 mEq/L (ref 3.5–5.1)
Protein, Total: 4.8 g/dL — ABNORMAL LOW (ref 6.0–8.3)
Sodium: 140 mEq/L (ref 136–145)

## 2017-08-23 LAB — PROTEIN / CREATININE RATIO, URINE
Urine Creatinine, Random: 165.9 mg/dL
Urine Protein Random: 55.8 mg/dL — ABNORMAL HIGH (ref 1.0–14.0)
Urine Protein/Creatinine Ratio: 0.3

## 2017-08-23 LAB — GFR: EGFR: 45.3

## 2017-08-23 LAB — MAGNESIUM: Magnesium: 1.4 mg/dL — ABNORMAL LOW (ref 1.6–2.6)

## 2017-08-23 LAB — LACTIC ACID, PLASMA: Lactic Acid: 3.2 mmol/L — ABNORMAL HIGH (ref 0.2–2.0)

## 2017-08-23 MED ORDER — IPRATROPIUM BROMIDE 0.02 % IN SOLN
0.5000 mg | Freq: Once | RESPIRATORY_TRACT | Status: DC
Start: 2017-08-23 — End: 2017-08-23

## 2017-08-23 MED ORDER — GUAIFENESIN ER 600 MG PO TB12
600.0000 mg | ORAL_TABLET | Freq: Two times a day (BID) | ORAL | Status: DC
Start: 2017-08-23 — End: 2017-08-24
  Administered 2017-08-23 (×2): 600 mg via ORAL
  Filled 2017-08-23 (×2): qty 1

## 2017-08-23 MED ORDER — SIMETHICONE 80 MG PO CHEW
80.0000 mg | CHEWABLE_TABLET | Freq: Four times a day (QID) | ORAL | Status: DC | PRN
Start: 2017-08-23 — End: 2017-08-23
  Filled 2017-08-23: qty 2

## 2017-08-23 MED ORDER — ALPRAZOLAM 0.25 MG PO TABS
0.2500 mg | ORAL_TABLET | Freq: Two times a day (BID) | ORAL | Status: DC | PRN
Start: 2017-08-23 — End: 2017-08-30
  Administered 2017-08-23 – 2017-08-25 (×3): 0.25 mg via ORAL
  Filled 2017-08-23 (×3): qty 1

## 2017-08-23 MED ORDER — BENZONATATE 100 MG PO CAPS
100.0000 mg | ORAL_CAPSULE | Freq: Two times a day (BID) | ORAL | Status: DC | PRN
Start: 2017-08-23 — End: 2017-08-23

## 2017-08-23 MED ORDER — ZOLPIDEM TARTRATE 5 MG PO TABS
5.0000 mg | ORAL_TABLET | Freq: Every evening | ORAL | Status: DC | PRN
Start: 2017-08-23 — End: 2017-08-30

## 2017-08-23 MED ORDER — HYDROMORPHONE HCL 1 MG/ML IJ SOLN
0.4000 mg | Freq: Once | INTRAMUSCULAR | Status: AC
Start: 2017-08-23 — End: 2017-08-23
  Administered 2017-08-23: 0.4 mg via INTRAVENOUS
  Filled 2017-08-23: qty 1

## 2017-08-23 MED ORDER — GUAIFENESIN-CODEINE 100-10 MG/5ML PO SYRP
5.0000 mL | ORAL_SOLUTION | Freq: Four times a day (QID) | ORAL | Status: DC | PRN
Start: 2017-08-23 — End: 2017-08-24
  Administered 2017-08-23: 5 mL via ORAL
  Filled 2017-08-23: qty 10

## 2017-08-23 MED ORDER — SODIUM BICARBONATE 8.4 % IV SOLN
INTRAVENOUS | Status: DC
Start: 2017-08-23 — End: 2017-08-24
  Filled 2017-08-23 (×2): qty 1000

## 2017-08-23 MED ORDER — ONDANSETRON HCL 4 MG/2ML IJ SOLN
4.0000 mg | INTRAMUSCULAR | Status: DC
Start: 2017-08-23 — End: 2017-08-23
  Administered 2017-08-23: 4 mg via INTRAVENOUS
  Filled 2017-08-23: qty 2

## 2017-08-23 MED ORDER — BENZONATATE 100 MG PO CAPS
100.0000 mg | ORAL_CAPSULE | Freq: Three times a day (TID) | ORAL | Status: DC | PRN
Start: 2017-08-23 — End: 2017-08-30
  Administered 2017-08-23: 100 mg via ORAL
  Filled 2017-08-23: qty 1

## 2017-08-23 MED ORDER — MAGNESIUM OXIDE 400 MG TABS (WRAP)
400.0000 mg | ORAL_TABLET | Freq: Two times a day (BID) | ORAL | Status: DC
Start: 2017-08-23 — End: 2017-08-24
  Administered 2017-08-23: 400 mg via ORAL
  Filled 2017-08-23: qty 1

## 2017-08-23 MED ORDER — ACETYLCYSTEINE 10 % IN SOLN
3.0000 mL | Freq: Three times a day (TID) | RESPIRATORY_TRACT | Status: DC
Start: 2017-08-23 — End: 2017-08-24
  Administered 2017-08-23 – 2017-08-24 (×2): 3 mL via RESPIRATORY_TRACT
  Filled 2017-08-23 (×2): qty 4

## 2017-08-23 MED ORDER — FUROSEMIDE 10 MG/ML IJ SOLN
40.0000 mg | Freq: Two times a day (BID) | INTRAMUSCULAR | Status: DC
Start: 2017-08-23 — End: 2017-08-24
  Administered 2017-08-23: 40 mg via INTRAVENOUS
  Filled 2017-08-23: qty 4

## 2017-08-23 MED ORDER — ALBUTEROL-IPRATROPIUM 2.5-0.5 (3) MG/3ML IN SOLN
3.0000 mL | Freq: Once | RESPIRATORY_TRACT | Status: AC
Start: 2017-08-23 — End: 2017-08-23
  Administered 2017-08-23: 3 mL via RESPIRATORY_TRACT

## 2017-08-23 MED ORDER — TAMSULOSIN HCL 0.4 MG PO CAPS
0.4000 mg | ORAL_CAPSULE | Freq: Every day | ORAL | Status: DC
Start: 2017-08-23 — End: 2017-08-30
  Administered 2017-08-23 – 2017-08-30 (×8): 0.4 mg via ORAL
  Filled 2017-08-23 (×8): qty 1

## 2017-08-23 MED ORDER — ONDANSETRON HCL 4 MG/2ML IJ SOLN
4.0000 mg | Freq: Two times a day (BID) | INTRAMUSCULAR | Status: DC | PRN
Start: 2017-08-23 — End: 2017-08-30

## 2017-08-23 MED ORDER — SODIUM CHLORIDE 0.9 % IV SOLN
INTRAVENOUS | Status: DC
Start: 2017-08-23 — End: 2017-08-23

## 2017-08-23 MED ORDER — SODIUM BICARBONATE 8.4 % IV SOLN
65.0000 mL/h | INTRAVENOUS | Status: DC
Start: 2017-08-23 — End: 2017-08-23
  Filled 2017-08-23: qty 1000

## 2017-08-23 MED ORDER — AMLODIPINE BESYLATE 5 MG PO TABS
5.0000 mg | ORAL_TABLET | Freq: Every day | ORAL | Status: DC
Start: 2017-08-24 — End: 2017-08-24

## 2017-08-23 MED ORDER — CALCIUM CARBONATE ANTACID 500 MG PO CHEW
500.0000 mg | CHEWABLE_TABLET | Freq: Four times a day (QID) | ORAL | Status: DC | PRN
Start: 2017-08-23 — End: 2017-08-30
  Administered 2017-08-23: 500 mg via ORAL
  Filled 2017-08-23 (×4): qty 1

## 2017-08-23 MED ORDER — ALBUTEROL-IPRATROPIUM 2.5-0.5 (3) MG/3ML IN SOLN
3.0000 mL | Freq: Four times a day (QID) | RESPIRATORY_TRACT | Status: DC
Start: 2017-08-23 — End: 2017-08-23

## 2017-08-23 NOTE — Progress Notes (Signed)
Patient states he feels full and unable to urinate since given Lasix 40 mg IV Foley inserted and got out about 150-200 cc dark yellow urine.

## 2017-08-23 NOTE — Progress Notes (Signed)
Chaplaincy Services volunteer provided spiritual care through visitation.  Chaplain available for additional support if needed/desired.    Cai Anfinson, BCC, MDiv, DMin   Chaplaincy Manager  [facility]  Chaplaincy Services  [phone]  **Page Chaplain 24/7 at [phone]**

## 2017-08-23 NOTE — Progress Notes (Signed)
[id number] Program    Education about New Medications and their Side effects    Dear [name] [name],    Its been a pleasure taking care of you during your hospitalization here at [facility]. We have initiated a new program to educate our patients and/or their family members or designated personnel about the new medications started by your physicians and their indications along with the possible side effects. Multiple studies have shown that patients started on new medications are often unaware of the names of the medication along with the indications and their side effects which leads to decreased compliance with the medications.    During our conversation today on 08/23/2017  10:36 AM I have explained to you the name of the new medication and the indication along with some possible common side effects. Listed below are some of the new medications started during this hospitalization.     Please call the Nurse if you have any side effects while in hospital.     Please call 911 if you have any life threatening symptoms after you are discharged from the hospital.    Please inform your Primary care physician for common side effects which are not life threatening after discharge.      Medication: fluticasone/vilanterol (Breo)   This Medication is used for:   COPD or Asthma   Decreases airway constriction and inflammation    Common Side Effects are:   Headache   Sore throat    A note from your nurse:  Rinse your mouth with water and spit out after using.  Call your nurse immediately if you notice itching, hives, swelling or trouble breathing       Thank you for your time.    Gailen Shelter, RRT, [facility]  08/23/2017  10:36 AM  [facility]  [address]  [place], [place]  [address]

## 2017-08-23 NOTE — Plan of Care (Signed)
Bedside chest xray completed per Dr. Cherlynn Polo order.

## 2017-08-23 NOTE — Progress Notes (Signed)
[facility]  Pulmonary, Critical Care, and Sleep Medicine    Name: Hilary Pundt MRN: [id number]  DOB: April 05, 1946 Hospital: [facility]  Date: 08/23/2017       IMPRESSION:  Patient Active Problem List   Diagnosis   . Chest pain   . Diabetes mellitus   . Heart attack   . CAD (coronary artery disease)   . Non-ischemic cardiomyopathy   . Acute exacerbation of CHF (congestive heart failure)   . PUD (peptic ulcer disease)   . Elevated LFTs   . Acute dyspnea   . Elevated d-dimer   . CHF (congestive heart failure)   . Headaches due to old head injury   . Essential hypertension   . Pneumonia due to infectious organism, unspecified laterality, unspecified part of lung   . Syncope and collapse   . AICD (automatic cardioverter/defibrillator) present   . Syncope, unspecified syncope type   . Pneumonia due to infectious organism   . Dyspnea, unspecified type   . Cardiomyopathy   . Cough   . Sepsis       PLAN:  Acute resp failure with hypoxia - Titrate FiO2 to keep Sat >90%  Ac exac of Asthma/RAD - Cont solumedrol, duonebs, Breo.   Poss RLL Pneumonia - Pt's LA is elevated but coming down. He was not given fluid bolus due to his hx of severe cardiomyopathy. Procalcitonin is normal. WBC count is normal. 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 Troponin I is neg X 2.   AKI - Will gently hydrate pt and consult nephrology  Hx of HTN - Cont Norvasc, Coreg  Hx of DM - Cont his home meds Lantus, Januvia.Cont ISS  Dyslipedemia - Cont Lipitor  Hx of CAD s/p PCI and stent - Cont home meds  Hx of GERD - PPI  DVT proph - Lovenox sq    FiO2 to keep SpO2 >=90%, HOB >=30 degree, aspiration precautions, aggressive pulmonary toileting, incentive spirometry.  Other issues management by primary team and respective consultants.    Events and notes from last 24 hours reviewed.   Discussed with patient,  answered all questions to his satisfaction.   Care plan discussed with nursing.     Labs and images personally seen and available reports reviewed  All current medicines are reviewed     Subjective/Interval History:  Pt feels slightly better. C/o dyspnea and cough. No chest pain    Medications:  Prior to Admission medications    Medication Sig Start Date End Date Taking? Authorizing Provider   amitriptyline (ELAVIL) 25 MG tablet amitriptyline 25 mg tablet   Take 1 tablet every day by oral route. 06/10/17  Yes [provider]   amlodipine-valsartan (EXFORGE) 10-320 MG per tablet amlodipine 10 mg-valsartan 320 mg tablet   Take 1 tablet every day by oral route. 07/15/17  Yes Zorita Pang, MD   aspirin 81 MG chewable tablet Chew 1 tablet (81 mg total) by mouth daily. 12/30/13  Yes McCoy, Dorice Lamas, NP   carvedilol (COREG) 25 MG tablet TAKE 1 TABLET BY MOUTH TWICE DAILY WITH MEALS. 06/08/17  Yes Zorita Pang, MD   esomeprazole (NEXIUM) 40 MG capsule  06/14/17  Yes [provider]   fluticasone-salmeterol (ADVAIR DISKUS) 100-50 MCG/DOSE Aerosol Powder, Breath Activtivatede Inhale 1 puff into the lungs 2 (two) times daily. 02/10/17  Yes Noelle,  Holger, MD   hydrocodone-chlorpheniramine (TUSSIONEX) 10-8 MG/5ML suspension Take 5 mLs by mouth 2 (two) times daily. 07/12/17  Yes Zorita Pang, MD   insulin glargine (LANTUS SOLOSTAR) 100 UNIT/ML injection pen Lantus Solostar U-100 Insulin 100 unit/mL (3 mL) subcutaneous pen 06/10/17  Yes [provider]   JANUMET 50-1000 MG tablet TAKE 1 TABLET BY MOUTH 2 (TWO) TIMES DAILY WITH MEALS. 11/22/16  Yes Zorita Pang, MD   niacin (NIASPAN) 500 MG CR tablet niacin ER 500 mg tablet,extended release 24 hr   Yes [provider]   SYMBICORT 80-4.5 MCG/ACT inhaler  07/02/17  Yes [provider]   glucose blood (ONE TOUCH ULTRA TEST) test strip 1 each by Other route 2 (two) times daily.Use as instructed 08/15/16   Zorita Pang, MD   zolpidem  (AMBIEN) 10 MG tablet Take 10 mg by mouth nightly as needed for Sleep.    [provider]     Current Facility-Administered Medications   Medication Dose Route Frequency   . albuterol-ipratropium  3 mL Nebulization Q6H SCH   . amitriptyline  25 mg Oral QHS   . amLODIPine-valsartan (EXFORGE) combination tablet 10-320 mg   Oral Daily   . aspirin  81 mg Oral Daily   . atorvastatin  40 mg Oral QHS   . cefTRIAXone  1 g Intravenous Q24H    And   . azithromycin  500 mg Oral Q24H   . carvedilol  25 mg Oral Q12H SCH   . enoxaparin  40 mg Subcutaneous Daily   . fluticasone furoate-vilanterol  1 puff Inhalation QAM   . insulin glargine  50 Units Subcutaneous QHS   . methylPREDNISolone  40 mg Intravenous Q6H   . niacin  500 mg Oral QHS   . pantoprazole  40 mg Oral QAM AC   . sitagliptin-metformin (JANUMET) combination tablet 50-1000 mg   Oral BID Meals     . sodium chloride 50 mL/hr at 08/22/17 2045       Objective:  Vital Signs:    BP 103/82   Pulse 78   Temp 97.2 F (36.2 C) (Temporal Artery)   Resp (!) 32   Ht 1.829 m (6')   Wt 102.3 kg (225 lb 8.5 oz)   SpO2 95%   BMI 30.59 kg/m       Prehospital Care  O2 Device: Nasal cannula  FiO2: 40 %  O2 Flow Rate (L/min): 5 L/min  Temp (24hrs), Avg:97.2 F (36.2 C), Min:97 F (36.1 C), Max:97.8 F (36.6 C)      Intake/Output:   Last shift:      No intake/output data recorded.  Last 3 shifts: 11/03 1901 - 11/05 0700  In: 2200 [P.O.:1800; I.V.:400]  Out: 450 [Urine:450]    Intake/Output Summary (Last 24 hours) at 08/23/17 1610  Last data filed at 08/23/17 0600   Gross per 24 hour   Intake             2200 ml   Output              450 ml   Net             1750 ml       Physical Exam:     General/Neurology: Alert, Awake  Head:   Normocephalic, without obvious abnormality  Eye:   PERRL, EOM intact  Oral:  Mucus membranes moist  Lung:   B/l air entry fair. No wheezing today  Heart:   S1 S2 present  Abdomen:  Soft, non tender, BS+nt   Extremities:  No pedal edema.    Skin:   Dry, intact  Data:      Recent Results (from the past 24 hour(s))   B-type Natriuretic Peptide    Collection Time: 08/22/17 11:55 AM   Result Value Ref Range    B-Natriuretic Peptide 1,828 (H) 0 - 100 pg/mL   CBC and differential    Collection Time: 08/22/17 11:55 AM   Result Value Ref Range    WBC 10.27 3.50 - 10.80 x10 3/uL    Hgb 14.3 13.0 - 17.0 g/dL    Hematocrit 16.1 09.6 - 52.0 %    Platelets 161 140 - 400 x10 3/uL    RBC 4.73 4.70 - 6.00 x10 6/uL    MCV 94.3 80.0 - 100.0 fL    MCH 30.2 28.0 - 32.0 pg    MCHC 32.1 32.0 - 36.0 g/dL    RDW 16 (H) 12 - 15 %    MPV 12.5 (H) 9.4 - 12.3 fL    Nucleated RBC Unmeasured 0.0 - 1.0 /100 WBC    Absolute NRBC Unmeasured 0 x10 3/uL   Comprehensive metabolic panel    Collection Time: 08/22/17 11:55 AM   Result Value Ref Range    Glucose 185 (H) 70 - 100 mg/dL    BUN 04.5 9.0 - 40.9 mg/dL    Creatinine 1.1 0.7 - 1.3 mg/dL    Sodium 811 914 - 782 mEq/L    Potassium 5.0 3.5 - 5.1 mEq/L    Chloride 109 100 - 111 mEq/L    CO2 19 (L) 22 - 29 mEq/L    Calcium 9.1 7.9 - 10.2 mg/dL    Protein, Total 6.9 6.0 - 8.3 g/dL    Albumin 3.9 3.5 - 5.0 g/dL    AST (SGOT) 39 (H) 5 - 34 U/L    ALT 51 0 - 55 U/L    Alkaline Phosphatase 99 38 - 106 U/L    Bilirubin, Total 1.9 (H) 0.2 - 1.2 mg/dL    Globulin 3.0 2.0 - 3.6 g/dL    Albumin/Globulin Ratio 1.3 0.9 - 2.2    Anion Gap 13.0 5.0 - 15.0   Magnesium    Collection Time: 08/22/17 11:55 AM   Result Value Ref Range    Magnesium 1.9 1.6 - 2.6 mg/dL   PT/APTT    Collection Time: 08/22/17 11:55 AM   Result Value Ref Range    PT 15.1 (H) 12.6 - 15.0 sec    PT INR 1.2 (H) 0.9 - 1.1    PT Anticoag. Given Within 48 hrs. None     PTT 27 23 - 37 sec   Troponin I    Collection Time: 08/22/17 11:55 AM   Result Value Ref Range    Troponin I 0.05 0.00 - 0.09 ng/mL   Lipase    Collection Time: 08/22/17 11:55 AM   Result Value Ref Range    Lipase 32 8 - 78 U/L   GFR    Collection Time: 08/22/17 11:55 AM   Result Value Ref Range    EGFR >60.0     Manual Differential    Collection Time: 08/22/17 11:55 AM   Result Value Ref Range    Segmented Neutrophils 49 None %    Band Neutrophils 1 None %    Lymphocytes Manual 39 None %    Monocytes Manual 0 None %    Eosinophils Manual 0 None %  Basophils Manual 0 None %    Metamyelocytes 1 None %    Myelocytes 1 None %    Atypical Lymph % 9 None %    Abs Seg Manual 5.03 1.80 - 8.10 x10 3/uL    Bands Absolute 0.10 0.00 - 1.00 x10 3/uL    Absolute Lymph Manual 4.01 0.50 - 4.40 x10 3/uL    Monocytes Absolute 0.00 0.00 - 1.20 x10 3/uL    Absolute Eos Manual 0.00 0.00 - 0.70 x10 3/uL    Absolute Baso Manual 0.00 0.00 - 0.20 x10 3/uL    Metamyelocytes Absolute 0.10 (H) 0 x10 3/uL    Absolute Myelocyte 0.10 (H) 0 x10 3/uL    Atypical Lymph Absolute 0.92 (H) 0 x10 3/uL   Cell MorpHology    Collection Time: 08/22/17 11:55 AM   Result Value Ref Range    Cell Morphology: Abnormal (A)     Platelet Estimate Normal     Macrocytic 1+ (A)     Ovalocytes 1     Toxic Granulation 1     Toxic Vacuolation 1    Lactic acid, plasma    Collection Time: 08/22/17 12:12 PM   Result Value Ref Range    Lactic acid 3.0 (H) 0.2 - 2.0 mmol/L   Lactic acid, plasma    Collection Time: 08/22/17  2:33 PM   Result Value Ref Range    Lactic acid 4.0 (HH) 0.2 - 2.0 mmol/L   Arterial Blood Gas (ABG)    Collection Time: 08/22/17  2:51 PM   Result Value Ref Range    Status Oxygen     ABG CollectionSite " RIGHT WRIST"     Allen's Test Yes     Temperature 97.2     ARTERIAL PH CORRECTED 7.27 (L) 7.35 - 7.45    ARTERIAL PCO2 CORRECTED 36.9 35.0 - 45.0 mmHg    Arterial pO2 Corrected 57.0 (LL) 80.0 - 100.0 mmHg    pH, Arterial 7.26 (L) 7.35 - 7.45    pCO2, Arterial 38.1 35.0 - 45.0 mmHg    pO2, Arterial 60.0 (L) 80.0 - 100.0 mmHg    HCO3, Arterial 17.2 (L) 22.0 - 28.0 mEq/L    Base Excess, Arterial -10.0 (L) -2.0 - 2.0 mEq/L    O2 Sat, Arterial 87.0 (L) 95.0 - 100.0 %    Arterial Total CO2 18.0 (L) 23.0 - 30.0 mEq/L    FIO2 36.0 %    O2 Delivery Nasal Cannula      O2 Flow 4.0 L/min   UA, Reflex to Microscopic    Collection Time: 08/22/17  5:55 PM   Result Value Ref Range    Urine Type random     Color, UA Yellow Clear - Yellow    Clarity, UA Sl Cloudy (A) Clear - Hazy    Specific Gravity UA >1.035 (A) 1.001 - 1.035    Urine pH 5.0 5.0 - 8.0    Leukocyte Esterase, UA Negative Negative    Nitrite, UA Negative Negative    Protein, UR 30 (A) Negative    Glucose, UA Negative Negative    Ketones UA Negative Negative    Urobilinogen, UA Normal 0.2 - 2.0 mg/dL    Bilirubin, UA Negative Negative    Blood, UA Negative Negative    RBC, UA 3 - 5 0 - 5 /hpf    WBC, UA 0-5 0 - 5 /hpf    Sperm, UA Present (A) None    Hyaline Casts, UA 0-3 0 -  5 /lpf   Lactic Acid    Collection Time: 08/22/17  6:23 PM   Result Value Ref Range    Lactic acid 2.6 (H) 0.2 - 2.0 mmol/L   Troponin I    Collection Time: 08/22/17  7:32 PM   Result Value Ref Range    Troponin I 0.04 0.00 - 0.09 ng/mL   Troponin I    Collection Time: 08/22/17  8:30 PM   Result Value Ref Range    Troponin I 0.04 0.00 - 0.09 ng/mL   Procalcitonin    Collection Time: 08/22/17  8:30 PM   Result Value Ref Range    Procalcitonin 0.12 (H) 0.00 - 0.10   Glucose Whole Blood - POCT    Collection Time: 08/22/17  9:29 PM   Result Value Ref Range    POCT - Glucose Whole blood 212 (H) 70 - 100 mg/dL   ECG 12 Lead    Collection Time: 08/22/17 11:46 PM   Result Value Ref Range    Ventricular Rate 85 BPM    Atrial Rate 85 BPM    P-R Interval 146 ms    QRS Duration 84 ms    Q-T Interval 402 ms    QTC Calculation (Bezet) 478 ms    P Axis 83 degrees    R Axis 25 degrees    T Axis 113 degrees   Lactic acid, plasma    Collection Time: 08/23/17  3:51 AM   Result Value Ref Range    Lactic acid 3.2 (H) 0.2 - 2.0 mmol/L   CBC and differential    Collection Time: 08/23/17  3:53 AM   Result Value Ref Range    WBC 10.60 3.50 - 10.80 x10 3/uL    Hgb 13.8 13.0 - 17.0 g/dL    Hematocrit 91.4 78.2 - 52.0 %    Platelets 147 140 - 400 x10 3/uL    RBC 4.55 (L) 4.70 -  6.00 x10 6/uL    MCV 97.6 80.0 - 100.0 fL    MCH 30.3 28.0 - 32.0 pg    MCHC 31.1 (L) 32.0 - 36.0 g/dL    RDW 16 (H) 12 - 15 %    MPV 12.4 (H) 9.4 - 12.3 fL    Neutrophils 59.1 None %    Lymphocytes Automated 37.9 None %    Monocytes 2.4 None %    Eosinophils Automated 0.0 None %    Basophils Automated 0.1 None %    Immature Granulocyte 0.5 None %    Nucleated RBC 0.2 0.0 - 1.0 /100 WBC    Neutrophils Absolute 6.27 1.80 - 8.10 x10 3/uL    Abs Lymph Automated 4.02 0.50 - 4.40 x10 3/uL    Abs Mono Automated 0.25 0.00 - 1.20 x10 3/uL    Abs Eos Automated 0.00 0.00 - 0.70 x10 3/uL    Absolute Baso Automated 0.01 0.00 - 0.20 x10 3/uL    Absolute Immature Granulocyte 0.05 0 x10 3/uL    Absolute NRBC 0.02 (H) 0 x10 3/uL   Comprehensive metabolic panel    Collection Time: 08/23/17  4:52 AM   Result Value Ref Range    Glucose 206 (H) 70 - 100 mg/dL    BUN 95.6 9.0 - 21.3 mg/dL    Creatinine 1.8 (H) 0.7 - 1.3 mg/dL    Sodium 086 578 - 469 mEq/L    Potassium 4.9 3.5 - 5.1 mEq/L    Chloride 115 (H) 100 - 111 mEq/L  CO2 14 (L) 22 - 29 mEq/L    Calcium 7.0 (L) 7.9 - 10.2 mg/dL    Protein, Total 4.8 (L) 6.0 - 8.3 g/dL    Albumin 2.8 (L) 3.5 - 5.0 g/dL    AST (SGOT) 54 (H) 5 - 34 U/L    ALT 61 (H) 0 - 55 U/L    Alkaline Phosphatase 69 38 - 106 U/L    Bilirubin, Total 1.2 0.2 - 1.2 mg/dL    Globulin 2.0 2.0 - 3.6 g/dL    Albumin/Globulin Ratio 1.4 0.9 - 2.2    Anion Gap 11.0 5.0 - 15.0   Magnesium    Collection Time: 08/23/17  4:52 AM   Result Value Ref Range    Magnesium 1.4 (L) 1.6 - 2.6 mg/dL   GFR    Collection Time: 08/23/17  4:52 AM   Result Value Ref Range    EGFR 45.3    Glucose Whole Blood - POCT    Collection Time: 08/23/17  7:56 AM   Result Value Ref Range    POCT - Glucose Whole blood 241 (H) 70 - 100 mg/dL         Chemistry Recent Labs      08/23/17   0452  08/22/17   1155   Glucose  206*  185*   Sodium  140  141   Potassium  4.9  5.0   Chloride  115*  109   CO2  14*  19*   BUN  25.0  15.0   Calcium  7.0*  9.1    Magnesium  1.4*  1.9   Albumin  2.8*  3.9   Globulin  2.0  3.0       CBC w/Diff Recent Labs      08/23/17   0353  08/22/17   1155   WBC  10.60  10.27   RBC  4.55*  4.73   Hgb  13.8  14.3   Hematocrit  44.4  44.6   Platelets  147  161       ABG     Recent Labs  Lab 08/22/17  1451   ABG CollectionSite " RIGHT WRIST"   Allen's Test Yes   Temperature 97.2   Arterial pO2 Corrected 57.0*   pH, Arterial 7.26*   pCO2, Arterial 38.1   HCO3, Arterial 17.2*   Base Excess, Arterial -10.0*   O2 Sat, Arterial 87.0*   FIO2 36.0   O2 Delivery Nasal Cannula       Micro  Invalid input(s): SDES, CULT  Invalid input(s): CULT    Ct Chest Wo Contrast    Result Date: 08/22/2017   1. Stable small layering pleural effusions with overlying atelectasis. 2. Numerous prominent mediastinal, axillary and suspect supraclavicular lymph nodes. Attention on follow-up. 3. Subpleural triangular benign-appearing left upper lobe nodule. In a low risk patient no further workup is needed. Mills Koller, MD 08/22/2017 2:24 PM    Ct Abd/pelvis With Iv Contrast Only    Result Date: 08/22/2017   1. Small amount of scattered ascites with no localized inflammation. No bowel obstruction. 2. 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 3. Numerous bilateral renal cysts. Mills Koller, MD 08/22/2017 2:45 PM    Xr Chest Ap Portable    Result Date: 08/22/2017   Right basilar airspace opacities indicative of atelectasis or pneumonia in the correct clinical setting. Radiographic follow-up to full resolution recommended. Elizebeth Koller, MD 08/22/2017 12:00 PM  XR (Most Recent). CXR reviewed by me and compared with previous CXR    See my orders for details     Total care time exclusive of procedures with complex decision making, coordination of care and counseling patient performed and > 50% time spent in face to face evaluation as mentioned above.    Lynann Bologna  08/23/2017

## 2017-08-23 NOTE — Consults (Addendum)
[facility] NE[facility]Y CONSULTATION    Date Time: 08/23/17 5:57 PM  Patient Name: [name] [name]  Requesting Physician: Lynann Bologna, MD      Reason for Consultation:   AKI    Assessment:   -NWG:NFAOZHYQ started this MV:HQION has been just placed with return of 150 cc dark urine and UOP since 1 pm is 150 cc only :hypotensive and got IV contrast last evening  -Met acidosis from AKI and lactic aciudosis  -Acute resp distress/failure:etiology is unclear but thought to be from pneumonia vs pulm edema overlay  -Bilateral renal cysts on imaging:no prior hx of PCKD or family GE:XBMWU be acquired cystic disease vs type II late onset ADPKD:Will need renal sonogram adn OP vol assessment down the road  -Hypomagnesemia  -Acute urinary retention s/p foley with severe urethral and bladder spasms  -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  -Hx of Chronic CHF  -DM II  -HTN    Plan:   -Would d/c foley in am  -one dose of dilaudid as pt is in major distress with pain related to foley placement  -Would d/c Exforge in light of AKI and change to only amlodipine at 5 mg daily as BP is 90-100 systolic  -Will get urine sodium,repeat UA and cx  -Add flomax given his bladder pain and can consider opium belladona suppository for bladder spasms if worse cautiously with CM  -Abx renally dosed as appropriate per critical care team  -Would hold metformin and only Januvia during acute phase  -Would r/o mesenteric ischemia if no improvement with CM and low flow situation with mesenteric dopplers(CT abd did not show any significant bowel ischemia on imaging so far  -On lasix 40 mg IV BID    D/W Wife and his nurse  Thank you Dr.Dubey for involving me in the care of this pt.  D/W Dr.Mendiguren,make him NPO,watch UOP and his condition closely    Gokul Waybright  [facility] [place]    History:   [name] [name] is a 71 y.o. male who presents to the hospital on 08/22/2017 with hx of DCM with EF of 20%,DM II,HTN and MMP  presented with abdominal pain and progressive shortness of breath over past 4 days.Found to be in hypoxic resp failure and AKI now since hospitalization.C/O on and of chest pain and still persistent supra pubic abdominal pain.Underwent a CT abd with IV Contrast last night with progressive AKi now and oliguria since am.c/o severe pain after foley is placed.     Past Medical History:     Past Medical History:   Diagnosis Date   . AICD (automatic cardioverter/defibrillator) present 2014   . CAD (coronary artery disease)    . Cardiac defibrillator in situ    . CHF (congestive heart failure) 08/12/2014   . Diabetes mellitus    . Heart attack 2011    and 2014   . Hyperlipemia    . Hypertension    . Ischemic cardiomyopathy     20%   . Pneumonia 09/2016       Past Surgical History:     Past Surgical History:   Procedure Laterality Date   . CARDIAC CATHETERIZATION     . CARDIAC DEFIBRILLATOR PLACEMENT     . CARDIAC PACEMAKER PLACEMENT     . CIRCUMCISION     . CORONARY STENT PLACEMENT  2011   . duodenal ulcer  1973   . EGD N/A 08/15/2014    Procedure: EGD;  Surgeon:  Colon Branch, MD;  Location: [facility];  Service: Gastroenterology;  Laterality: N/A;  egd w/ bx   . HERNIA REPAIR      [facility]   . orthopedic surgery      R foot corrective   . TONSILLECTOMY AND ADENOIDECTOMY  1954       Family History:     Family History   Problem Relation Age of Onset   . Breast cancer Mother    . Heart attack Mother 42   . Hypertension Mother    . Diabetes Mother    . Diabetes Brother    . Heart attack Father    . Hypertension Father        Social History:     Social History     Social History   . Marital status: Married     Spouse name: Lajoyce Corners   . Number of children: 0   . Years of education: N/A     Occupational History   . 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 Smoking status: Never Smoker   . Smokeless tobacco: Never Used   . Alcohol use No   . Drug use: No   . Sexual activity: Yes     Partners: Female      Other Topics Concern   . Not on file     Social History Narrative   . No narrative on file       Allergies:   No Known Allergies    Medications:     Current Facility-Administered Medications   Medication Dose Route Frequency   . albuterol-ipratropium  3 mL Nebulization Q6H SCH   . amitriptyline  25 mg Oral QHS   . amLODIPine-valsartan (EXFORGE) combination tablet 10-320 mg   Oral Daily   . aspirin  81 mg Oral Daily   . atorvastatin  40 mg Oral QHS   . cefTRIAXone  1 g Intravenous Q24H    And   . azithromycin  500 mg Oral Q24H   . carvedilol  25 mg Oral Q12H SCH   . enoxaparin  40 mg Subcutaneous Daily   . fluticasone furoate-vilanterol  1 puff Inhalation QAM   . furosemide  40 mg Intravenous BID   . guaiFENesin  600 mg Oral Q12H SCH   . insulin glargine  50 Units Subcutaneous QHS   . magnesium oxide  400 mg Oral BID   . methylPREDNISolone  40 mg Intravenous Q6H   . niacin  500 mg Oral QHS   . pantoprazole  40 mg Oral QAM AC   . sitagliptin-metformin (JANUMET) combination tablet 50-1000 mg   Oral BID Meals       Review of Systems:   A comprehensive review of systems was: as noted in HPI    Physical Exam:     Vitals:    08/23/17 1600   BP: 99/72   Pulse: 75   Resp: (!) 30   Temp:    SpO2: 99%       Intake and Output Summary (Last 24 hours) at Date Time  Alert and ox3,in moderate distress  grunting with shortness of breath  NO JVD  Chest:transmitted rhonchi and coarse BS+  ZOX:WRUEA  VWU:JWJX,BJYNWGNFAO tenderness  Ext:no edema          Labs Reviewed:     Results     Procedure Component Value Units Date/Time    MRSA culture [[id number]] Collected:  08/22/17  2030    Specimen:  Body Fluid from Nasal Swab Updated:  08/23/17 1735    Narrative:       ORDER#: [id number]                                    ORDERED BY: DUBEY, ADITYA  SOURCE: Nares                                        COLLECTED:  08/22/17 20:30  ANTIBIOTICS AT COLL.:                                RECEIVED :  08/22/17 23:11  Culture MRSA Surveillance                   FINAL       08/23/17 17:35  08/23/17   Negative for Methicillin Resistant Staph aureus      MRSA culture [[id number]] Collected:  08/22/17 2030    Specimen:  Body Fluid from Throat Updated:  08/23/17 1735    Narrative:       ORDER#: [id number]                                    ORDERED BY: DUBEY, ADITYA  SOURCE: Throat                                       COLLECTED:  08/22/17 20:30  ANTIBIOTICS AT COLL.:                                RECEIVED :  08/22/17 23:11  Culture MRSA Surveillance                  FINAL       08/23/17 17:35  08/23/17   Negative for Methicillin Resistant Staph aureus      Glucose Whole Blood - POCT [[id number]]  (Abnormal) Collected:  08/23/17 1650     Updated:  08/23/17 1705     POCT - Glucose Whole blood 226 (H) mg/dL     Glucose Whole Blood - POCT [[id number]]  (Abnormal) Collected:  08/23/17 1154     Updated:  08/23/17 1157     POCT - Glucose Whole blood 262 (H) mg/dL     Glucose Whole Blood - POCT [[id number]]  (Abnormal) Collected:  08/23/17 0756     Updated:  08/23/17 0820     POCT - Glucose Whole blood 241 (H) mg/dL     Comprehensive metabolic panel [[id number]]  (Abnormal) Collected:  08/23/17 0452     Updated:  08/23/17 0635     Glucose 206 (H) mg/dL      BUN 18.8 mg/dL      Creatinine 1.8 (H) mg/dL      Sodium 416 mEq/L      Potassium 4.9 mEq/L      Chloride 115 (H) mEq/L      CO2 14 (L) mEq/L      Calcium 7.0 (L) mg/dL      Protein, Total  4.8 (L) g/dL      Albumin 2.8 (L) g/dL      AST (SGOT) 54 (H) U/L      ALT 61 (H) U/L      Alkaline Phosphatase 69 U/L      Bilirubin, Total 1.2 mg/dL      Globulin 2.0 g/dL      Albumin/Globulin Ratio 1.4     Anion Gap 11.0    Magnesium [[id number]]  (Abnormal) Collected:  08/23/17 0452     Updated:  08/23/17 0635     Magnesium 1.4 (L) mg/dL     GFR [[id number]] Collected:  08/23/17 0452     Updated:  08/23/17 0635     EGFR 45.3    Rapid influenza A/B antigens [[id number]] Collected:  08/23/17 0352    Specimen:  Nasopharyngeal from Nasal  Aspirate Updated:  08/23/17 0420    Narrative:       ORDER#: [id number]                                    ORDERED BY: DUBEY, ADITYA  SOURCE: Nasal Aspirate                               COLLECTED:  08/23/17 03:52  ANTIBIOTICS AT COLL.:                                RECEIVED :  08/23/17 04:03  Influenza Rapid Antigen A&B                FINAL       08/23/17 04:20  08/23/17   Negative for Influenza A and B             Reference Range: Negative      Lactic acid, plasma [[id number]]  (Abnormal) Collected:  08/23/17 0351    Specimen:  Blood Updated:  08/23/17 0415     Lactic acid 3.2 (H) mmol/L     CBC and differential [[id number]]  (Abnormal) Collected:  08/23/17 0353    Specimen:  Blood from Blood Updated:  08/23/17 0406     WBC 10.60 x10 3/uL      Hgb 13.8 g/dL      Hematocrit 53.6 %      Platelets 147 x10 3/uL      RBC 4.55 (L) x10 6/uL      MCV 97.6 fL      MCH 30.3 pg      MCHC 31.1 (L) g/dL      RDW 16 (H) %      MPV 12.4 (H) fL      Neutrophils 59.1 %      Lymphocytes Automated 37.9 %      Monocytes 2.4 %      Eosinophils Automated 0.0 %      Basophils Automated 0.1 %      Immature Granulocyte 0.5 %      Nucleated RBC 0.2 /100 WBC      Neutrophils Absolute 6.27 x10 3/uL      Abs Lymph Automated 4.02 x10 3/uL      Abs Mono Automated 0.25 x10 3/uL      Abs Eos Automated 0.00 x10 3/uL      Absolute Baso Automated 0.01 x10 3/uL      Absolute  Immature Granulocyte 0.05 x10 3/uL      Absolute NRBC 0.02 (H) x10 3/uL     Procalcitonin [[id number]]  (Abnormal) Collected:  08/22/17 2030     Updated:  08/22/17 2332     Procalcitonin 0.12 (H)    Blood Culture Aerobic/Anaerobic #1 [[id number]] Collected:  08/22/17 1433    Specimen:  Arm from Blood, Intravenous Line Updated:  08/22/17 2325    Narrative:       1 BLUE+1 PURPLE    Glucose Whole Blood - POCT [[id number]]  (Abnormal) Collected:  08/22/17 2129     Updated:  08/22/17 2137     POCT - Glucose Whole blood 212 (H) mg/dL     Troponin I [[id number]] Collected:  08/22/17 2030     Specimen:  Blood Updated:  08/22/17 2101     Troponin I 0.04 ng/mL     Troponin I [[id number]] Collected:  08/22/17 1932    Specimen:  Blood Updated:  08/22/17 1959     Troponin I 0.04 ng/mL     Lactic Acid [[id number]]  (Abnormal) Collected:  08/22/17 1823    Specimen:  Blood Updated:  08/22/17 1842     Lactic acid 2.6 (H) mmol/L     UA, Reflex to Microscopic [[id number]]  (Abnormal) Collected:  08/22/17 1755     Updated:  08/22/17 1833     Urine Type random     Color, UA Yellow     Clarity, UA Sl Cloudy (A)     Specific Gravity UA >1.035 (A)     Urine pH 5.0     Leukocyte Esterase, UA Negative     Nitrite, UA Negative     Protein, UR 30 (A)     Glucose, UA Negative     Ketones UA Negative     Urobilinogen, UA Normal mg/dL      Bilirubin, UA Negative     Blood, UA Negative     RBC, UA 3 - 5 /hpf      WBC, UA 0-5 /hpf      Sperm, UA Present (A)     Hyaline Casts, UA 0-3 /lpf               Rads:   Radiological Procedure reviewed.     Signed by:  Eddie North, M.D  [facility] [phone]

## 2017-08-23 NOTE — UM Notes (Addendum)
ref# [id number]    [facility] Utilization Review  NPI # [id number]  Please call Tala Eber at [phone] with any questions or concerns.   Fax final authorization and request for additional information to [phone].    71 yr old male presenting to ED 08/22/17 with c/o SOB, Abd pain and respiratory distress. On EMS arrival patient with grunting, respiratory distress, diffuse wheezing, 85% on RA.     Patient admitted to inpatient same date @ 1519. Dx: Sepsis    VS: Temp 97.0, HR 79, RR 39, RR 126/96, SpO2 86% up to 99% on 4 L/min  NC  Tachypnea 30's    Past Medical History:   Diagnosis Date   . AICD (automatic cardioverter/defibrillator) present 2014   . CAD (coronary artery disease)    . Cardiac defibrillator in situ    . CHF (congestive heart failure) 08/12/2014   . Diabetes mellitus    . Heart attack 2011    and 2014   . Hyperlipemia    . Hypertension    . Ischemic cardiomyopathy     20%   . Pneumonia 09/2016     Abn labs  Glucose 185  CO2 19  AST 39  Total bilirubin 1.9  BNP 1,828  Lactic acid 3.0, repeat 4.0    UA: Sl cloudy, specific >1.035, +protein      ABG: pH 7.27, pO2 57, HCO3 17.2, Base excess -10.0, O2 sat 87, Arterial total CO2 18    CT abd/pelvis: 1. Small amount of scattered ascites with no localized inflammation. No  bowel obstruction.    2. 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    3. Numerous bilateral renal cysts.      CT Chest: 1. Stable small layering pleural effusions with overlying atelectasis.    2. Numerous prominent mediastinal, axillary and suspect supraclavicular  lymph nodes. Attention on follow-up.    3. Subpleural triangular benign-appearing left upper lobe nodule. In a  low risk patient no further workup is needed    CXR:    Right basilar airspace opacities indicative of atelectasis  or pneumonia in the correct clinical setting.        ED meds  Solu-medrol iv  Ativan iv  Zofran iv  Zosyn iv  Lasix iv  Lovenox sc  0.9% ns  iv  Albuterol/Atrovent neb  Rocephin iv    ----Admit to [facility] inpatient  Orders  Duo-nebs  Elavil po  Norvasc po  Zithromax po  Coreg po  Rocephin iv  Lovenox sc  Breo-ellipta puff  Lantus sc  Atrovent neb  Solu-medrol iv  Zofran iv  Protonix po  0.9% ns iv @ 50 ml/hr  Tylenol po prn  D40% po prn  Humulin R sc prn    Attending H&P  IMPRESSION and PLAN:  Problem List:       Patient Active Problem List   Diagnosis   . Chest pain   . Cardiomyopathy   . Cough   . Sepsis     Acute resp failure with hypoxia - Titrate FiO2 to keep Sat >90%  Ac exac of Asthma/RAD - Start solumedrol, duonebs, Breo  Poss RLL Pneumonia - Pt's LA is elevated. He was not given fluid bolus due to his hx of severe cardiomyopathy. Plus pt's WBC count is also normal. Will send Procalcitonin level. Start emp Rocephin and Zithro  Non ischemic cardiomyopathy with EF 20% s/p BiV ICD in 01/18 - Cont his home meds including  Norvasc, ASA, Coreg. Diurese with lasix carefully. Check Troponin I panel    ----08/23/17 in [facility]    VS: Temp 97.2, HR 103, RR 22, BP 114/96, SpO2 97% on 4 L/min    Abn labs  RBC 4.55  Glucose 206  Creat 1.8  CO2 14  Calcium 7.0  Magnesium 1.4  AST 54  ALT 61  Lactic acid 3.2    Attending H&P  IMPRESSION:      Patient Active Problem List   Diagnosis   . Chest pain   . Pneumonia due to infectious organism, unspecified laterality, unspecified part of lung   . Pneumonia due to infectious organism   . Dyspnea, unspecified type   . Cardiomyopathy   . Cough   . Sepsis     PLAN:  Acute resp failure with hypoxia - Titrate FiO2 to keep Sat >90%  Ac exac of Asthma/RAD - Cont solumedrol, duonebs, Breo.   Poss RLL Pneumonia - Pt's LA is elevated but coming down. He was not given fluid bolus due to his hx of severe cardiomyopathy. Procalcitonin is normal. WBC count is normal. 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 Troponin I is neg X 2.   AKI -  Will gently hydrate pt and consult nephrology

## 2017-08-23 NOTE — Consults (Signed)
HEART CARDIOLOGY CONSULTATION REPORT  [facility]    Date Time: 08/23/17 1:55 PM  Patient Name: [name] [name]  Requesting Physician: Lynann Bologna, MD       Reason for Consultation:   Chest pain      History:   [name] [name] is a 71 y.o. male admitted on 08/22/2017. We have been asked by Lynann Bologna, MD, to provide cardiac consultation, regarding chest pain. Pt was admitted with acute respiratory failure with hypoxia, treated for asthma exacerbation and for possible PNA and we are consulted for left sided chest pain.    On exam the pt has almost continuous cough. His left sided chest pain is worse with direct palpation. He has been receiving gentle IV fluids. He feels that his breathing has gotten worse not better since admission. There are no acute EKG changes. 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 Last OV 11/2016.        Past Medical History:     Past Medical History:   Diagnosis Date   . AICD (automatic cardioverter/defibrillator) present 2014   . CAD (coronary artery disease)    . Cardiac defibrillator in situ    . CHF (congestive heart failure) 08/12/2014   . Diabetes mellitus    . Heart attack 2011    and 2014   . Hyperlipemia    . Hypertension    . Ischemic cardiomyopathy     20%   . Pneumonia 09/2016       Past Surgical History:     Past Surgical History:   Procedure Laterality Date   . CARDIAC CATHETERIZATION     . CARDIAC DEFIBRILLATOR PLACEMENT     . CARDIAC PACEMAKER PLACEMENT     . CIRCUMCISION     . CORONARY STENT PLACEMENT  2011   . duodenal ulcer  1973   . EGD N/A 08/15/2014    Procedure: EGD;  Surgeon: Colon Branch, MD;  [facility];  Service: Gastroenterology;  Laterality: N/A;  egd w/ bx   . HERNIA REPAIR      [facility]   . orthopedic surgery      R foot corrective   . TONSILLECTOMY AND ADENOIDECTOMY  1954       Family History:     Family History   Problem Relation Age of Onset   . Breast cancer Mother    . Heart attack Mother 26   .  Hypertension Mother    . Diabetes Mother    . Diabetes Brother    . Heart attack Father    . Hypertension Father        Social History:     Social History     Social History   . Marital status: Married     Spouse name: Lajoyce Corners   . Number of children: 0   . Years of education: N/A     Occupational History   . 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 Smoking status: Never Smoker   . Smokeless tobacco: Never Used   . Alcohol use No   . Drug use: No   . Sexual activity: Yes     Partners: Female     Other Topics Concern   . Not on file     Social History Narrative   . No narrative on file       Allergies:   No Known Allergies  Medications:     Facility-Administered Medications Prior to Admission   Medication   . levalbuterol (XOPENEX) nebulizer solution 1.25 mg     Prescriptions Prior to Admission   Medication Sig   . amitriptyline (ELAVIL) 25 MG tablet amitriptyline 25 mg tablet   Take 1 tablet every day by oral route.   Marland Kitchen amlodipine-valsartan (EXFORGE) 10-320 MG per tablet amlodipine 10 mg-valsartan 320 mg tablet   Take 1 tablet every day by oral route.   Marland Kitchen aspirin 81 MG chewable tablet Chew 1 tablet (81 mg total) by mouth daily.   . carvedilol (COREG) 25 MG tablet TAKE 1 TABLET BY MOUTH TWICE DAILY WITH MEALS.   Marland Kitchen esomeprazole (NEXIUM) 40 MG capsule    . fluticasone-salmeterol (ADVAIR DISKUS) 100-50 MCG/DOSE Aerosol Powder, Breath Activtivatede Inhale 1 puff into the lungs 2 (two) times daily.   . hydrocodone-chlorpheniramine (TUSSIONEX) 10-8 MG/5ML suspension Take 5 mLs by mouth 2 (two) times daily.   . insulin glargine (LANTUS SOLOSTAR) 100 UNIT/ML injection pen Lantus Solostar U-100 Insulin 100 unit/mL (3 mL) subcutaneous pen   . JANUMET 50-1000 MG tablet TAKE 1 TABLET BY MOUTH 2 (TWO) TIMES DAILY WITH MEALS.   . niacin (NIASPAN) 500 MG CR tablet niacin ER 500 mg tablet,extended release 24 hr   . SYMBICORT 80-4.5 MCG/ACT inhaler    . glucose blood (ONE TOUCH ULTRA TEST) test strip 1 each  by Other route 2 (two) times daily.Use as instructed   . zolpidem (AMBIEN) 10 MG tablet Take 10 mg by mouth nightly as needed for Sleep.       Current Facility-Administered Medications   Medication Dose Route Frequency Provider Last Rate Last Dose   . 0.9%  NaCl infusion   Intravenous Continuous Lynann Bologna, MD 50 mL/hr at 08/23/17 0931     . acetaminophen (TYLENOL) tablet 650 mg  650 mg Oral Q4H PRN Lynann Bologna, MD   650 mg at 08/23/17 1303    Or   . acetaminophen (TYLENOL) suppository 650 mg  650 mg Rectal Q4H PRN Lynann Bologna, MD       . albuterol-ipratropium (DUO-NEB) 2.5-0.5(3) mg/3 mL nebulizer 3 mL  3 mL Nebulization Q6H SCH Lynann Bologna, MD   3 mL at 08/23/17 0755   . amitriptyline (ELAVIL) tablet 25 mg  25 mg Oral QHS Lynann Bologna, MD   25 mg at 08/22/17 2206   . amLODIPine (NORVASC), valsartan (DIOVAN) for EXFORGE   Oral Daily Lynann Bologna, MD       . aspirin chewable tablet 81 mg  81 mg Oral Daily Lynann Bologna, MD   81 mg at 08/23/17 1008   . atorvastatin (LIPITOR) tablet 40 mg  40 mg Oral QHS Lynann Bologna, MD   40 mg at 08/22/17 2205   . cefTRIAXone (ROCEPHIN) 1 g in sodium chloride 0.9 % 100 mL IVPB mini-bag plus  1 g Intravenous Q24H Lynann Bologna, MD   1 g at 08/22/17 1937    And   . azithromycin (ZITHROMAX) tablet 500 mg  500 mg Oral Q24H Lynann Bologna, MD   500 mg at 08/22/17 2045   . carvedilol (COREG) tablet 25 mg  25 mg Oral Q12H [facility] Lynann Bologna, MD   25 mg at 08/23/17 0929   . dextrose (GLUCOSE) 40 % oral gel 15 g of glucose  15 g of glucose Oral PRN Lynann Bologna, MD        And   .  dextrose 50 % bolus 12.5 g  12.5 g Intravenous PRN Lynann Bologna, MD        And   . glucagon (rDNA) (GLUCAGEN) injection 1 mg  1 mg Intramuscular PRN Lynann Bologna, MD       . enoxaparin (LOVENOX) syringe 40 mg  40 mg Subcutaneous Daily Lynann Bologna, MD   40 mg at 08/23/17 1008   . fluticasone furoate-vilanterol (BREO ELLIPTA) 100-25 MCG/INH 1 puff  1 puff Inhalation QAM  Lynann Bologna, MD   1 puff at 08/23/17 0755   . guaiFENesin-codeine (ROBITUSSIN AC) 100-10 MG/5ML syrup 5 mL  5 mL Oral Q6H PRN Lynann Bologna, MD   5 mL at 08/23/17 1145   . insulin glargine (LANTUS) injection 50 Units  50 Units Subcutaneous QHS Lynann Bologna, MD   50 Units at 08/22/17 2204   . insulin regular (HumuLIN R,NovoLIN R) injection 1-4 Units  1-4 Units Subcutaneous QHS PRN Lynann Bologna, MD       . insulin regular (HumuLIN R,NovoLIN R) injection 1-8 Units  1-8 Units Subcutaneous TID AC PRN Lynann Bologna, MD   5 Units at 08/23/17 1235   . methylPREDNISolone sodium succinate (Solu-MEDROL) injection 40 mg  40 mg Intravenous Q6H Lynann Bologna, MD   40 mg at 08/23/17 1228   . naloxone [facility]) injection 0.2 mg  0.2 mg Intravenous PRN Lynann Bologna, MD       . niacin (NIASPAN) CR tablet 500 mg  500 mg Oral QHS Lynann Bologna, MD   500 mg at 08/22/17 2206   . ondansetron (ZOFRAN) injection 4 mg  4 mg Intravenous Q24H Lynann Bologna, MD   4 mg at 08/23/17 1228   . pantoprazole (PROTONIX) EC tablet 40 mg  40 mg Oral QAM AC Lynann Bologna, MD   40 mg at 08/23/17 0929   . SITagliptin (JANUVIA), metFORMIN (GLUCOPHAGE) for JANUMET   Oral BID Meals Lynann Bologna, MD       . zolpidem [facility]) tablet 5 mg  5 mg Oral QHS PRN Phillips Climes, MD             Review of Systems:    Comprehensive review of systems including constitutional, eyes, ears, nose, mouth, throat, cardiovascular, GI, GU, musculoskeletal, integumentary, respiratory, neurologic, psychiatric, and endocrine is negative other than what is mentioned already in the history of present illness    Physical Exam:     Vitals:    08/23/17 1300   BP: 111/87   Pulse: 76   Resp: (!) 28   Temp:    SpO2: (!) 86%     Temp (24hrs), Avg:97.2 F (36.2 C), Min:97 F (36.1 C), Max:97.8 F (36.6 C)      Intake and Output Summary (Last 24 hours) at Date Time    Intake/Output Summary (Last 24 hours) at 08/23/17 1355  Last data filed at 08/23/17 1000    Gross per 24 hour   Intake             2200 ml   Output              500 ml   Net             1700 ml       GENERAL: Patient has continuous dry cough   HEENT: No scleral icterus or conjunctival pallor, moist mucous membranes   NECK: + jugular venous distention  CARDIAC: Normal apical impulse, regular rate and rhythm, with normal S1 and S2, and no murmurs, rubs, or gallops   CHEST: Crackles left posterior base  ABDOMEN: + abdominal distention, nontender, non-distended, good bowel sounds, + HJR  EXTREMITIES: No clubbing, cyanosis, or edema   NEUROLOGIC: Alert and oriented to time, place and person, normal mood and affect  MUSCULOSKELETAL: Normal muscle strength and tone.      Labs Reviewed:       Recent Labs  Lab 08/22/17  2030 08/22/17  1932 08/22/17  1155   Troponin I 0.04 0.04 0.05               Recent Labs  Lab 08/23/17  0452   Bilirubin, Total 1.2   Protein, Total 4.8*   Albumin 2.8*   ALT 61*   AST (SGOT) 54*       Recent Labs  Lab 08/23/17  0452   Magnesium 1.4*       Recent Labs  Lab 08/22/17  1155   PT 15.1*   PT INR 1.2*   PTT 27       Recent Labs  Lab 08/23/17  0353 08/22/17  1155   WBC 10.60 10.27   Hgb 13.8 14.3   Hematocrit 44.4 44.6   Platelets 147 161       Recent Labs  Lab 08/23/17  0452 08/22/17  1155   Sodium 140 141   Potassium 4.9 5.0   Chloride 115* 109   CO2 14* 19*   BUN 25.0 15.0   Creatinine 1.8* 1.1   EGFR 45.3 >60.0   Glucose 206* 185*   Calcium 7.0* 9.1         Radiology   Radiological Procedure reviewed.      chest X-ray  Assessment:    Admitted with acute respiratory failure presumed asthma flare and possible PNA. 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   EF 30% ECHO 11/2016 ECHO   ARF-cardiorenal   Nonobstructive coronary artery disease by catheterization  12/2012   Normal myocardial perfusion by nuclear stress testing 06/2016.    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   History of syncope in the past that has never been proven to represent ventricular arrhythmias by device interrogations.  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

## 2017-08-23 NOTE — Plan of Care (Signed)
Problem: Safety  Goal: Patient will be free from injury during hospitalization  Outcome: Progressing      Comments: Patient aware of high falls risk due to multiple equipment attachments

## 2017-08-23 NOTE — Progress Notes (Signed)
[name] [name] is a 71 y.o. male who presents to the ED with SOB/Abd pain;  Patient moderate respiratory distress and hence.  History is limited.  Reports that last 4-5 days, he has been having diffuse lower abdominal pain--- points to the periumbilical region.  Over the last few days he has been having trouble breathing, intermittently today started having worsening shortness of breath and hence, EMS was called.  On EMS arrival, patient was to, grunting, respiratory distress, diffuse wheezing, 85 percent on room air; albuterol, Atrovent nebulizers were started and patient was brought to the ER.    Patient lives with wife and may need PT/OT evaluation to determine discharge recommendations.        08/23/17 1513   Patient Type   Within 30 Days of Previous Admission? No   Healthcare Decisions   Interviewed: Spouse;Patient   Name of interviewee if other than the pt: [name] [name], wife, [phone] cell    Interviewee Contact Information: [name] [name], wife, [phone] cell    Orientation/Decision Making Abilities of Patient Alert and Oriented x3, able to make decisions   Advance Directive Patient does not have advance directive   Healthcare Agent Appointed No   Additional Emergency Contacts? [name] [name], wife, [phone] cell    Prior to admission   Prior level of function Independent with ADLs;Ambulates independently   Type of Residence Private residence   Home Layout Multi-level;Bed/bath upstairs;Performs ADL's on one level  (town house, 1 step outside to enter and 28-30 steps to go to his bedroom)   Have running water, electricity, heat, etc? Yes   Living Arrangements Spouse/significant other   How do you get to your MD appointments? himself    How do you get your groceries? himself or wife    Who fixes your meals? wife    Who does your laundry? wife   Who picks up your prescriptions? wife    Dressing Independent   Grooming Independent   Feeding Independent   Bathing Independent   Toileting  Independent   DME Currently at Home Other (Comment)  (none)   Home Care/Community Services None  (patient had home health in the past, but wife does not recall the name of the agency)   Prior SNF admission? (Detail) none   Prior Rehab admission? (Detail) none   Adult Protective Services (APS) involved? No   Discharge Planning   Support Systems Spouse/significant other;Children   Patient expects to be discharged to: home   Anticipated [place] plan discussed with: Spouse;Patient   [place] discussion contact information: [name] [name], wife, [phone] cell    Potential barriers to discharge: Testing/procedure   Mode of transportation: Private car (family member)   Consults/Providers   PT Evaluation Needed 2   OT Evalulation Needed 2   SLP Evaluation Needed 2   Outcome Palliative Care Screen Screened but did not meet criteria for intervention   Correct PCP listed in Epic? Yes   Important Message from [facility] Notice   Patient received 1st IMM Letter? n/a

## 2017-08-24 ENCOUNTER — Encounter
Admission: EM | Disposition: A | Discharge status: Skilled Nursing Facility | Payer: Self-pay | Source: Home / Self Care | Attending: Critical Care Medicine

## 2017-08-24 ENCOUNTER — Inpatient Hospital Stay: Payer: Commercial Managed Care - POS

## 2017-08-24 ENCOUNTER — Other Ambulatory Visit: Payer: Commercial Managed Care - POS

## 2017-08-24 LAB — GLUCOSE WHOLE BLOOD - POCT
Whole Blood Glucose POCT: 116 mg/dL — ABNORMAL HIGH (ref 70–100)
Whole Blood Glucose POCT: 118 mg/dL — ABNORMAL HIGH (ref 70–100)
Whole Blood Glucose POCT: 159 mg/dL — ABNORMAL HIGH (ref 70–100)
Whole Blood Glucose POCT: 166 mg/dL — ABNORMAL HIGH (ref 70–100)
Whole Blood Glucose POCT: 203 mg/dL — ABNORMAL HIGH (ref 70–100)

## 2017-08-24 LAB — COMPREHENSIVE METABOLIC PANEL
ALT: 214 U/L — ABNORMAL HIGH (ref 0–55)
AST (SGOT): 181 U/L — ABNORMAL HIGH (ref 5–34)
Albumin/Globulin Ratio: 1.3 (ref 0.9–2.2)
Albumin: 3.7 g/dL (ref 3.5–5.0)
Alkaline Phosphatase: 91 U/L (ref 38–106)
Anion Gap: 16 — ABNORMAL HIGH (ref 5.0–15.0)
BUN: 54.4 mg/dL — ABNORMAL HIGH (ref 9.0–28.0)
Bilirubin, Total: 0.6 mg/dL (ref 0.2–1.2)
CO2: 12 mEq/L — ABNORMAL LOW (ref 22–29)
Calcium: 9 mg/dL (ref 7.9–10.2)
Chloride: 104 mEq/L (ref 100–111)
Creatinine: 3.7 mg/dL — ABNORMAL HIGH (ref 0.7–1.3)
Globulin: 2.9 g/dL (ref 2.0–3.6)
Glucose: 188 mg/dL — ABNORMAL HIGH (ref 70–100)
Potassium: 7 mEq/L (ref 3.5–5.1)
Protein, Total: 6.6 g/dL (ref 6.0–8.3)
Sodium: 132 mEq/L — ABNORMAL LOW (ref 136–145)

## 2017-08-24 LAB — PT AND APTT
PT INR: 1.4 — ABNORMAL HIGH (ref 0.9–1.1)
PT: 17.4 s — ABNORMAL HIGH (ref 12.6–15.0)
PTT: 32 s (ref 23–37)

## 2017-08-24 LAB — ECG 12-LEAD
Atrial Rate: 72 {beats}/min
P Axis: 55 degrees
P-R Interval: 152 ms
Q-T Interval: 418 ms
QRS Duration: 90 ms
QTC Calculation (Bezet): 457 ms
R Axis: -13 degrees
T Axis: 103 degrees
Ventricular Rate: 72 {beats}/min

## 2017-08-24 LAB — CBC AND DIFFERENTIAL
Absolute NRBC: 0.09 10*3/uL — ABNORMAL HIGH
Basophils Absolute Automated: 0.01 10*3/uL (ref 0.00–0.20)
Basophils Automated: 0.1 %
Eosinophils Absolute Automated: 0 10*3/uL (ref 0.00–0.70)
Eosinophils Automated: 0 %
Hematocrit: 42.2 % (ref 42.0–52.0)
Hgb: 13.4 g/dL (ref 13.0–17.0)
Immature Granulocytes Absolute: 0.14 10*3/uL — ABNORMAL HIGH
Immature Granulocytes: 0.8 %
Lymphocytes Absolute Automated: 5.05 10*3/uL — ABNORMAL HIGH (ref 0.50–4.40)
Lymphocytes Automated: 27.8 %
MCH: 30.3 pg (ref 28.0–32.0)
MCHC: 31.8 g/dL — ABNORMAL LOW (ref 32.0–36.0)
MCV: 95.5 fL (ref 80.0–100.0)
MPV: 13.1 fL — ABNORMAL HIGH (ref 9.4–12.3)
Monocytes Absolute Automated: 0.91 10*3/uL (ref 0.00–1.20)
Monocytes: 5 %
Neutrophils Absolute: 12.06 10*3/uL — ABNORMAL HIGH (ref 1.80–8.10)
Neutrophils: 66.3 %
Nucleated RBC: 0.5 /100 WBC (ref 0.0–1.0)
Platelets: 160 10*3/uL (ref 140–400)
RBC: 4.42 10*6/uL — ABNORMAL LOW (ref 4.70–6.00)
RDW: 15 % (ref 12–15)
WBC: 18.17 10*3/uL — ABNORMAL HIGH (ref 3.50–10.80)

## 2017-08-24 LAB — CBC
Absolute NRBC: 0.05 10*3/uL — ABNORMAL HIGH
Absolute NRBC: 0.09 10*3/uL — ABNORMAL HIGH
Hematocrit: 37.9 % — ABNORMAL LOW (ref 42.0–52.0)
Hematocrit: 40.1 % — ABNORMAL LOW (ref 42.0–52.0)
Hgb: 12.2 g/dL — ABNORMAL LOW (ref 13.0–17.0)
Hgb: 12.9 g/dL — ABNORMAL LOW (ref 13.0–17.0)
MCH: 30.6 pg (ref 28.0–32.0)
MCH: 30.4 pg (ref 28.0–32.0)
MCHC: 32.2 g/dL (ref 32.0–36.0)
MCHC: 32.2 g/dL (ref 32.0–36.0)
MCV: 95 fL (ref 80.0–100.0)
MCV: 94.4 fL (ref 80.0–100.0)
MPV: 12.6 fL — ABNORMAL HIGH (ref 9.4–12.3)
MPV: 12.1 fL (ref 9.4–12.3)
Nucleated RBC: 0.3 /100 WBC (ref 0.0–1.0)
Nucleated RBC: 0.5 /100 WBC (ref 0.0–1.0)
Platelets: 132 10*3/uL — ABNORMAL LOW (ref 140–400)
Platelets: 151 10*3/uL (ref 140–400)
RBC: 3.99 10*6/uL — ABNORMAL LOW (ref 4.70–6.00)
RBC: 4.25 10*6/uL — ABNORMAL LOW (ref 4.70–6.00)
RDW: 15 % (ref 12–15)
RDW: 15 % (ref 12–15)
WBC: 16.71 10*3/uL — ABNORMAL HIGH (ref 3.50–10.80)
WBC: 17.09 10*3/uL — ABNORMAL HIGH (ref 3.50–10.80)

## 2017-08-24 LAB — ARTERIAL BLOOD GAS (~~LOC~~)
ARTERIAL PCO2 CORRECTED: 36 mmHg (ref 35.0–45.0)
ARTERIAL PH CORRECTED: 7.29 — ABNORMAL LOW (ref 7.35–7.45)
Arterial pO2 Corrected: 74 mmHg — ABNORMAL LOW (ref 80.0–100.0)
Base Excess, Arterial: -8.5 mEq/L — ABNORMAL LOW (ref <=2.0)
HCO3, Arterial: 17.3 mEq/L — ABNORMAL LOW (ref 22.0–28.0)
O2 Flow: 5 L/min
O2 Sat, Arterial: 92.8 % — ABNORMAL LOW (ref 95.0–100.0)
Temperature: 98.6
pCO2, Arterial: 36 mmHg (ref 35.0–45.0)
pH, Arterial: 7.29 — ABNORMAL LOW (ref 7.35–7.45)
pO2, Arterial: 74 mmHg — ABNORMAL LOW (ref 80.0–100.0)

## 2017-08-24 LAB — PT/INR
PT INR: 1.5 — ABNORMAL HIGH (ref 0.9–1.1)
PT: 17.7 s — ABNORMAL HIGH (ref 12.6–15.0)

## 2017-08-24 LAB — BASIC METABOLIC PANEL
Anion Gap: 14 (ref 5.0–15.0)
BUN: 60 mg/dL — ABNORMAL HIGH (ref 9.0–28.0)
CO2: 17 mEq/L — ABNORMAL LOW (ref 22–29)
Calcium: 8.7 mg/dL (ref 7.9–10.2)
Chloride: 103 mEq/L (ref 100–111)
Creatinine: 4.1 mg/dL — ABNORMAL HIGH (ref 0.7–1.3)
Glucose: 167 mg/dL — ABNORMAL HIGH (ref 70–100)
Potassium: 5.5 mEq/L — ABNORMAL HIGH (ref 3.5–5.1)
Sodium: 134 mEq/L — ABNORMAL LOW (ref 136–145)

## 2017-08-24 LAB — ALBUMIN: Albumin: 3.2 g/dL — ABNORMAL LOW (ref 3.5–5.0)

## 2017-08-24 LAB — MAGNESIUM
Magnesium: 2.4 mg/dL (ref 1.6–2.6)
Magnesium: 2 mg/dL (ref 1.6–2.6)
Magnesium: 1.8 mg/dL (ref 1.6–2.6)

## 2017-08-24 LAB — RENAL FUNCTION PANEL
Albumin: 3.3 g/dL — ABNORMAL LOW (ref 3.5–5.0)
Anion Gap: 13 (ref 5.0–15.0)
BUN: 47 mg/dL — ABNORMAL HIGH (ref 9.0–28.0)
CO2: 20 mEq/L — ABNORMAL LOW (ref 22–29)
Calcium: 9.1 mg/dL (ref 7.9–10.2)
Chloride: 102 mEq/L (ref 100–111)
Creatinine: 2.8 mg/dL — ABNORMAL HIGH (ref 0.7–1.3)
Glucose: 141 mg/dL — ABNORMAL HIGH (ref 70–100)
Phosphorus: 6.5 mg/dL — ABNORMAL HIGH (ref 2.3–4.7)
Potassium: 4.4 mEq/L (ref 3.5–5.1)
Sodium: 135 mEq/L — ABNORMAL LOW (ref 136–145)

## 2017-08-24 LAB — LACTIC ACID, PLASMA: Lactic Acid: 1.9 mmol/L (ref 0.2–2.0)

## 2017-08-24 LAB — SODIUM, URINE, RANDOM: Urine Sodium Random: <20 mEq/L

## 2017-08-24 LAB — CALCIUM, IONIZED
Calcium, Ionized: 2.2 mEq/L (ref 2.20–2.60)
Calcium, Ionized: 0.9 mEq/L — ABNORMAL LOW (ref 2.20–2.60)
Calcium, Ionized: 1.9 mEq/L — ABNORMAL LOW (ref 2.20–2.60)

## 2017-08-24 LAB — GFR
EGFR: 19.7
EGFR: 17.5
EGFR: 27.2

## 2017-08-24 LAB — PHOSPHORUS: Phosphorus: 9.1 mg/dL — ABNORMAL HIGH (ref 2.3–4.7)

## 2017-08-24 LAB — B-TYPE NATRIURETIC PEPTIDE: B-Natriuretic Peptide: 1551.7 pg/mL — ABNORMAL HIGH (ref 0.0–100.0)

## 2017-08-24 LAB — APTT: PTT: 26 s (ref 23–37)

## 2017-08-24 SURGERY — NON-TUNNELED CATH PLACEMENT
Site: Neck

## 2017-08-24 SURGERY — NON-TUNNELED CATH PLACEMENT
Anesthesia: Local | Laterality: Right

## 2017-08-24 SURGERY — TRIPLE LUMEN CATH (TLC)
Site: Chest

## 2017-08-24 SURGERY — NON-TUNNELED CATH PLACEMENT

## 2017-08-24 MED ORDER — POTASSIUM CHLORIDE 10 MEQ/100ML IV SOLN (WRAP)
10.0000 meq | INTRAVENOUS | Status: DC | PRN
Start: 2017-08-24 — End: 2017-08-24

## 2017-08-24 MED ORDER — INSULIN REGULAR HUMAN 100 UNIT/ML IJ SOLN
1.0000 [IU] | INTRAMUSCULAR | Status: DC
Start: 2017-08-24 — End: 2017-08-26
  Administered 2017-08-25: 3 [IU] via SUBCUTANEOUS
  Administered 2017-08-25: 1 [IU] via SUBCUTANEOUS
  Administered 2017-08-25: 3 [IU] via SUBCUTANEOUS
  Administered 2017-08-25: 1 [IU] via SUBCUTANEOUS
  Administered 2017-08-25: 3 [IU] via SUBCUTANEOUS
  Administered 2017-08-25: 5 [IU] via SUBCUTANEOUS
  Administered 2017-08-26: 3 [IU] via SUBCUTANEOUS
  Administered 2017-08-26: 1 [IU] via SUBCUTANEOUS
  Filled 2017-08-24: qty 9
  Filled 2017-08-24: qty 15
  Filled 2017-08-24 (×2): qty 3
  Filled 2017-08-24: qty 9
  Filled 2017-08-24: qty 3
  Filled 2017-08-24 (×2): qty 9

## 2017-08-24 MED ORDER — POTASSIUM CHLORIDE 20 MEQ/50ML IV SOLN
20.0000 meq | INTRAVENOUS | Status: DC | PRN
Start: 2017-08-24 — End: 2017-08-26
  Administered 2017-08-24 – 2017-08-26 (×6): 20 meq via INTRAVENOUS
  Filled 2017-08-24: qty 50
  Filled 2017-08-24: qty 150
  Filled 2017-08-24 (×3): qty 50

## 2017-08-24 MED ORDER — PRISMASATE BGK 4/2.5 CRRT FLUID
Status: DC
Start: 2017-08-24 — End: 2017-08-24

## 2017-08-24 MED ORDER — PRISMASATE 0/3.5 CRRT FLUID
Status: DC
Start: 2017-08-24 — End: 2017-08-26
  Administered 2017-08-24 – 2017-08-26 (×12): 1500 mL/h via INTRAVENOUS_CENTRAL

## 2017-08-24 MED ORDER — POTASSIUM CHLORIDE 10 MEQ/100ML IV SOLN (WRAP)
10.0000 meq | INTRAVENOUS | Status: DC | PRN
Start: 2017-08-24 — End: 2017-08-26
  Administered 2017-08-25: 10 meq via INTRAVENOUS
  Filled 2017-08-24: qty 100

## 2017-08-24 MED ORDER — PHENYLEPHRINE HCL-NACL 100-0.9 MG/250ML-% IV SOLN
50.0000 ug/min | INTRAVENOUS | Status: DC
Start: 2017-08-24 — End: 2017-08-26
  Administered 2017-08-24: 50 ug/min via INTRAVENOUS

## 2017-08-24 MED ORDER — DEXTROSE 5 % IV SOLN
30.0000 mmol | INTRAVENOUS | Status: DC | PRN
Start: 2017-08-24 — End: 2017-08-26

## 2017-08-24 MED ORDER — DEXTROSE 50 % IV SOLN
50.0000 mL | Freq: Once | INTRAVENOUS | Status: AC
Start: 2017-08-24 — End: 2017-08-24
  Administered 2017-08-24: 50 mL via INTRAVENOUS
  Filled 2017-08-24: qty 50

## 2017-08-24 MED ORDER — PRISMASATE 2/0 DIALYSATE FLUID
INTRAVENOUS | Status: DC
Start: 2017-08-24 — End: 2017-08-26
  Administered 2017-08-24 – 2017-08-26 (×12): 1500 mL/h via INTRAVENOUS_CENTRAL

## 2017-08-24 MED ORDER — INSULIN REGULAR HUMAN 100 UNIT/ML IJ SOLN
1.0000 [IU] | INTRAMUSCULAR | Status: DC
Start: 2017-08-24 — End: 2017-08-24
  Administered 2017-08-24: 1 [IU] via SUBCUTANEOUS
  Filled 2017-08-24: qty 3

## 2017-08-24 MED ORDER — POTASSIUM CHLORIDE 20 MEQ/50ML IV SOLN
20.0000 meq | INTRAVENOUS | Status: DC | PRN
Start: 2017-08-24 — End: 2017-08-24

## 2017-08-24 MED ORDER — SODIUM CHLORIDE 0.9 % IV SOLN
60.0000 mL/h | INTRAVENOUS | Status: DC
Start: 2017-08-24 — End: 2017-08-26
  Administered 2017-08-24: 70 mL/h via INTRAVENOUS
  Administered 2017-08-24 – 2017-08-25 (×3): 80 mL/h via INTRAVENOUS
  Administered 2017-08-25 – 2017-08-26 (×3): 70 mL/h via INTRAVENOUS
  Filled 2017-08-24 (×7): qty 100

## 2017-08-24 MED ORDER — DEXTROSE 5 % IV SOLN
20.0000 mmol | INTRAVENOUS | Status: DC | PRN
Start: 2017-08-24 — End: 2017-08-24

## 2017-08-24 MED ORDER — METHYLPREDNISOLONE SODIUM SUCC 40 MG IJ SOLR
20.0000 mg | Freq: Four times a day (QID) | INTRAMUSCULAR | Status: DC
Start: 2017-08-24 — End: 2017-08-28
  Administered 2017-08-24 – 2017-08-28 (×17): 20 mg via INTRAVENOUS
  Filled 2017-08-24 (×17): qty 1

## 2017-08-24 MED ORDER — SODIUM BICARBONATE 8.4 % IV SOLN
50.0000 meq | Freq: Once | INTRAVENOUS | Status: AC
Start: 2017-08-24 — End: 2017-08-24
  Administered 2017-08-24: 50 meq via INTRAVENOUS
  Filled 2017-08-24: qty 50

## 2017-08-24 MED ORDER — ACD FORMULA A 0.73-2.45-2.2 GM/100ML VI SOLN
Status: DC
Start: 2017-08-24 — End: 2017-08-26
  Administered 2017-08-25 – 2017-08-26 (×3): 150 mL
  Filled 2017-08-24 (×8): qty 1000

## 2017-08-24 MED ORDER — SODIUM CHLORIDE 0.9 % IJ SOLN
10.0000 mL | INTRAMUSCULAR | Status: DC | PRN
Start: 2017-08-24 — End: 2017-08-26

## 2017-08-24 MED ORDER — DEXTROSE 5 % IV SOLN
30.0000 mmol | INTRAVENOUS | Status: DC | PRN
Start: 2017-08-24 — End: 2017-08-24

## 2017-08-24 MED ORDER — INSULIN REGULAR HUMAN 100 UNIT/ML IJ SOLN
10.0000 [IU] | Freq: Once | INTRAMUSCULAR | Status: AC
Start: 2017-08-24 — End: 2017-08-24
  Administered 2017-08-24: 10 [IU] via INTRAVENOUS
  Filled 2017-08-24: qty 30

## 2017-08-24 MED ORDER — CALCIUM GLUCONATE 10 % IV SOLN
60.0000 mL/h | INTRAVENOUS | Status: DC
Start: 2017-08-24 — End: 2017-08-24
  Filled 2017-08-24: qty 100

## 2017-08-24 MED ORDER — SODIUM CHLORIDE 0.9 % IJ SOLN
10.0000 mL | INTRAMUSCULAR | Status: DC | PRN
Start: 2017-08-24 — End: 2017-08-24

## 2017-08-24 MED ORDER — CALCIUM GLUCONATE 10 % IV SOLN
1.0000 g | Freq: Once | INTRAVENOUS | Status: DC
Start: 2017-08-24 — End: 2017-08-24

## 2017-08-24 MED ORDER — DEXTROSE 5 % IV SOLN
20.0000 mmol | INTRAVENOUS | Status: DC | PRN
Start: 2017-08-24 — End: 2017-08-26

## 2017-08-24 MED ORDER — SODIUM CHLORIDE 0.9 % IV MBP
2.2500 g | Freq: Three times a day (TID) | INTRAVENOUS | Status: DC
Start: 2017-08-24 — End: 2017-08-25
  Administered 2017-08-24 – 2017-08-25 (×3): 2.25 g via INTRAVENOUS
  Filled 2017-08-24 (×5): qty 2.25

## 2017-08-24 MED ORDER — ACD FORMULA A 0.73-2.45-2.2 GM/100ML VI SOLN
Status: DC
Start: 2017-08-24 — End: 2017-08-24
  Filled 2017-08-24: qty 1000

## 2017-08-24 MED ORDER — SODIUM POLYSTYRENE SULFONATE 15 GM/60ML PO SUSP
15.0000 g | Freq: Once | ORAL | Status: AC
Start: 2017-08-24 — End: 2017-08-24
  Administered 2017-08-24: 15 g via ORAL
  Filled 2017-08-24: qty 60

## 2017-08-24 MED ORDER — MAGNESIUM SULFATE IN D5W 1-5 GM/100ML-% IV SOLN
1.0000 g | INTRAVENOUS | Status: DC | PRN
Start: 2017-08-24 — End: 2017-08-26
  Administered 2017-08-25: 1 g via INTRAVENOUS
  Filled 2017-08-24: qty 100

## 2017-08-24 MED ORDER — MAGNESIUM SULFATE IN D5W 1-5 GM/100ML-% IV SOLN
1.0000 g | INTRAVENOUS | Status: DC | PRN
Start: 2017-08-24 — End: 2017-08-24

## 2017-08-24 MED ORDER — SODIUM CHLORIDE 0.9 % IV SOLN
1.0000 g | Freq: Once | INTRAVENOUS | Status: AC
Start: 2017-08-24 — End: 2017-08-24
  Administered 2017-08-24: 1 g via INTRAVENOUS
  Filled 2017-08-24: qty 10

## 2017-08-24 NOTE — Plan of Care (Signed)
Problem: Ineffective Gas Exchange  Goal: Effective breathing pattern  Outcome: Not Progressing   08/24/17 0316   Goal/Interventions addressed this shift   Effective breathing pattern Maintain O2 saturation level per LIP order;Maintain CO2 level per LIP order;Teach/reinforce use of ordered respiratory interventions (ie. CPAP, BiPAP, Incentive Spirometer, Acapella);Monitor for sleep apnea;Monitor for medication induced respiratory depression

## 2017-08-24 NOTE — Plan of Care (Signed)
Problem: Renal Instability  Goal: Fluid and electrolyte balance are achieved/maintained  Outcome: Progressing   08/24/17 2233   Goal/Interventions addressed this shift   Fluid and electrolyte balance are achieved/maintained  Monitor intake and output every shift;Monitor/assess lab values and report abnormal values;Monitor daily weight;Provide adequate hydration;Assess for confusion/personality changes;Assess and reassess fluid and electrolyte status;Observe for seizure activity and initiate seizure precautions if indicated;Observe for cardiac arrhythmias;Monitor for muscle weakness;Follow fluid restrictions/IV/PO parameters     CRRT treatment in progress. Patient tolerating well and Neo gtt off at this time.  Patient remains on 2L NC with o2 sats >96%.  See flow sheet for full assessment and I+O's.

## 2017-08-24 NOTE — Progress Notes (Signed)
Dr. Sharma Covert, during rounds, told the Case Manager that patient will be transferred to the ICU due to renal failure.  Hohn Cath is ordered and may need CRRT treatment.  Discharge needs are pending at this time and CM will follow up as needed.

## 2017-08-24 NOTE — Progress Notes (Signed)
Lab called to notify this RN of an elevated K+ of 6.6 but that the specimen needs to be re-collected for verification of results.  The phlebotomist is to return to the unit to recollect the sample to be re-run as soon as possible (phlebotomy not obtainable by unit clinical staff due to poor venous access).  The MD is to be notified as soon as the result is verified by the lab.        08/24/17 0545   Provider Notification   Reason for Communication Other (Comment)  (Lab Communication)   Critical Lab Chemistry   Critical Lab Value K+ Elevated 6.6   Method of Communication Call   Readback Results Yes

## 2017-08-24 NOTE — Progress Notes (Signed)
TLC and Quinton catheter with pigtail placed at bedside by Dr George Hugh.  Placement verified by xray.  Procedural report given to [facility], pt's RN.  Pt tolerated procedure well.

## 2017-08-24 NOTE — Progress Notes (Signed)
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  -Requested IR to place Quinton and cleared for central line given difficulty getting IV line and need for possible pressors  -s/p insulin+D50,sodium bicarb one amp and drip ongoing,no kayexalate given abdominal pain and possible ischemic colitis/mesentericischemia  -Hold all antihypertensives,d/c'd lasix,exforge and metformin yesterday  -Cotninue IV bicarb drip until after CRRT starts and will repeat labs this evening.  -Pressors as required to keep MAP>65  -repeat lactate and abdomina limaging if increasing with GS consult  -Called wife Lajoyce Corners several times and left message  -Pt consented for IV line and CRRT.      Eddie North, M.D  [facility] [place]  [phone]  Subjective:   Noted overnight events  Increasingly short of breath and confused  Hyperkalemia this am  On bicarb drip  remains anuric    Objective:   Vital signs in last 24 hours:  Temp:  [96.9 F (36.1 C)-98.2 F (36.8 C)] 97.3 F (36.3 C)  Heart Rate:  [66-103] 74  Resp Rate:  [11-34] 28  BP: (92-122)/(58-96) 96/77     General:    In distress and on oxygen,somewhat altered,easily re oriented    HEENT:   Conjunctiva pale, Dry mucosa          CVS: tachy, no rub         Chest:  Rhonchi,diminished,rales LLL+  Abdomen:   Soft, distended,BS sluggish and suprapubic tenderness,no rebound           Ext.:   No edema      Intake/Output:   Intake/Output last 24 hours:    Intake/Output Summary (Last 24 hours) at 08/24/17 0747  Last data filed at 08/24/17 0600   Gross per 24 hour   Intake              772 ml   Output              480 ml   Net              292 ml     Intake/Output this shift:  No intake/output data recorded.      Labs:       Recent Labs  Lab 08/23/17  0452 08/22/17  1155   Sodium 140 141   Potassium 4.9 5.0   Chloride 115* 109   CO2 14* 19*   BUN 25.0 15.0   Creatinine 1.8* 1.1   Glucose 206* 185*   Calcium 7.0* 9.1   Magnesium 1.4*  1.9   EGFR 45.3 >60.0       Recent Labs  Lab 08/24/17  0419 08/23/17  0353 08/22/17  1155   WBC 18.17* 10.60 10.27   Hgb 13.4 13.8 14.3   Hematocrit 42.2 44.4 44.6   Platelets 160 147 161                   Medications:   Scheduled Meds:       acetylcysteine 3 mL Nebulization Q8H SCH   albuterol-ipratropium 3 mL Nebulization Q6H SCH   amitriptyline 25 mg Oral QHS   amLODIPine 5 mg Oral Daily   aspirin 81 mg Oral Daily   atorvastatin 40 mg Oral QHS   cefTRIAXone 1 g Intravenous Q24H   And      azithromycin 500 mg Oral Q24H   carvedilol 25 mg Oral Q12H SCH   enoxaparin 40 mg Subcutaneous Daily   fluticasone furoate-vilanterol 1 puff Inhalation QAM   guaiFENesin 600 mg Oral Q12H SCH   insulin glargine 50 Units Subcutaneous QHS   magnesium oxide 400 mg Oral BID   methylPREDNISolone 40 mg Intravenous Q6H   niacin 500 mg Oral QHS   pantoprazole 40 mg Oral QAM AC   tamsulosin 0.4 mg Oral QD after dinner         Continuous Infusions:  . custom IV infusion builder 65 mL/hr at 08/23/17 2214         PRN Meds:acetaminophen **OR** acetaminophen, ALPRAZolam, benzonatate, calcium carbonate, Nursing communication: Adult Hypoglycemia Treatment Algorithm **AND** dextrose **AND** dextrose **AND** glucagon (rDNA), guaiFENesin-codeine, insulin regular, insulin regular,  naloxone, ondansetron, zolpidem

## 2017-08-24 NOTE — Plan of Care (Signed)
Problem: Renal Instability  Goal: Fluid and electrolyte balance are achieved/maintained  Outcome: Progressing   08/24/17 1843   Goal/Interventions addressed this shift   Fluid and electrolyte balance are achieved/maintained  Monitor intake and output every shift;Provide adequate hydration;Monitor daily weight;Assess for confusion/personality changes;Assess and reassess fluid and electrolyte status;Observe for seizure activity and initiate seizure precautions if indicated;Observe for cardiac arrhythmias;Monitor for muscle weakness;Follow fluid restrictions/IV/PO parameters;Monitor/assess lab values and report abnormal values

## 2017-08-24 NOTE — Progress Notes (Signed)
[facility]  Pulmonary, Critical Care, and Sleep Medicine    Name: [name] [name] MRN: [id number]  DOB: [birth year] Hospital: [facility]  Date: 08/24/2017       IMPRESSION:  Patient Active Problem List   Diagnosis   . Chest pain   . Diabetes mellitus   . Heart attack   . CAD (coronary artery disease)   . Non-ischemic cardiomyopathy   . Acute exacerbation of CHF (congestive heart failure)   . PUD (peptic ulcer disease)   . Elevated LFTs   . Acute dyspnea   . Elevated d-dimer   . CHF (congestive heart failure)   . Headaches due to old head injury   . Essential hypertension   . Pneumonia due to infectious organism, unspecified laterality, unspecified part of lung   . Syncope and collapse   . AICD (automatic cardioverter/defibrillator) present   . Syncope, unspecified syncope type   . Pneumonia due to infectious organism   . Dyspnea, unspecified type   . Cardiomyopathy   . Cough   . Sepsis   . AKI (acute kidney injury)       PLAN:  AKI - Pt's Cr has gone up today and his UOP is very low. Has sig hyperkalemia. D/w Nephrology, Dr.Tandra. Pt is being planned for CRRT. Tx pt to ICU for it  Acute resp failure with hypoxia - Titrate FiO2 to keep Sat >90%  Ac exac of Asthma/RAD - Cont solumedrol, duonebs, Breo.   Poss RLL Pneumonia - Pt's WBC count is going up. 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 Troponin I is neg X 2.   Hx of HTN - Cont Norvasc, Coreg  Hx of DM - Cont his home meds Lantus, Januvia.Cont ISS  Dyslipedemia - Cont Lipitor  Hx of CAD s/p PCI and stent - Cont home meds  Hx of GERD - PPI  DVT proph - Lovenox sq    FiO2 to keep SpO2 >=90%, HOB >=30 degree, aspiration precautions, aggressive pulmonary toileting, incentive spirometry.  Other issues management by primary team and respective consultants.    Events and notes from last 24 hours reviewed.   Discussed with  patient, answered all questions to his satisfaction.   Care plan discussed with nursing.     Labs and images personally seen and available reports reviewed  All current medicines are reviewed     Subjective/Interval History:  Pt is more sleepy today. Does not c/o dyspnea today. No c/o chest pain, abd pain    Medications:  Prior to Admission medications    Medication Sig Start Date End Date Taking? Authorizing Provider   amitriptyline (ELAVIL) 25 MG tablet amitriptyline 25 mg tablet   Take 1 tablet every day by oral route. 06/10/17  Yes [provider]   amlodipine-valsartan (EXFORGE) 10-320 MG per tablet amlodipine 10 mg-valsartan 320 mg tablet   Take 1 tablet every day by oral route. 07/15/17  Yes Zorita Pang, MD   aspirin 81 MG chewable tablet Chew 1 tablet (81 mg total) by mouth daily. 12/30/13  Yes McCoy, Dorice Lamas, NP   carvedilol (COREG) 25 MG tablet TAKE 1 TABLET BY MOUTH TWICE DAILY WITH MEALS. 06/08/17  Yes Zorita Pang, MD   esomeprazole (NEXIUM) 40 MG capsule  06/14/17  Yes [provider]   fluticasone-salmeterol (ADVAIR DISKUS) 100-50 MCG/DOSE Aerosol Powder, Breath Activtivatede Inhale 1  puff into the lungs 2 (two) times daily. 02/10/17  Yes Zorita Pang, MD   hydrocodone-chlorpheniramine (TUSSIONEX) 10-8 MG/5ML suspension Take 5 mLs by mouth 2 (two) times daily. 07/12/17  Yes Zorita Pang, MD   insulin glargine (LANTUS SOLOSTAR) 100 UNIT/ML injection pen Lantus Solostar U-100 Insulin 100 unit/mL (3 mL) subcutaneous pen 06/10/17  Yes [provider]   JANUMET 50-1000 MG tablet TAKE 1 TABLET BY MOUTH 2 (TWO) TIMES DAILY WITH MEALS. 11/22/16  Yes Zorita Pang, MD   niacin (NIASPAN) 500 MG CR tablet niacin ER 500 mg tablet,extended release 24 hr   Yes [provider]   SYMBICORT 80-4.5 MCG/ACT inhaler  07/02/17  Yes [provider]   glucose blood (ONE TOUCH ULTRA TEST) test strip 1 each by Other route 2 (two) times daily.Use as instructed 08/15/16    Zorita Pang, MD   zolpidem (AMBIEN) 10 MG tablet Take 10 mg by mouth nightly as needed for Sleep.    [provider]     Current Facility-Administered Medications   Medication Dose Route Frequency   . amitriptyline  25 mg Oral QHS   . aspirin  81 mg Oral Daily   . atorvastatin  40 mg Oral QHS   . cefTRIAXone  1 g Intravenous Q24H    And   . azithromycin  500 mg Oral Q24H   . calcium GLUConate  1 g Intravenous Once   . carvedilol  25 mg Oral Q12H SCH   . fluticasone furoate-vilanterol  1 puff Inhalation QAM   . guaiFENesin  600 mg Oral Q12H SCH   . insulin glargine  50 Units Subcutaneous QHS   . magnesium oxide  400 mg Oral BID   . methylPREDNISolone  20 mg Intravenous Q6H   . niacin  500 mg Oral QHS   . pantoprazole  40 mg Oral QAM AC   . tamsulosin  0.4 mg Oral QD after dinner     . custom IV infusion builder 65 mL/hr at 08/23/17 2214       Objective:  Vital Signs:    BP 97/86   Pulse 74   Temp 97.3 F (36.3 C) (Temporal Artery)   Resp 22   Ht 1.829 m (6')   Wt 102.3 kg (225 lb 8.5 oz)   SpO2 100%   BMI 30.59 kg/m       Prehospital Care  O2 Device: Nasal cannula  FiO2: 40 %  O2 Flow Rate (L/min): 5 L/min  Temp (24hrs), Avg:97.3 F (36.3 C), Min:96.9 F (36.1 C), Max:98.2 F (36.8 C)      Intake/Output:   Last shift:      No intake/output data recorded.  Last 3 shifts: 11/04 1901 - 11/06 0700  In: 2972 [P.O.:2040; I.V.:932]  Out: 930 [Urine:930]    Intake/Output Summary (Last 24 hours) at 08/24/17 0947  Last data filed at 08/24/17 0600   Gross per 24 hour   Intake              772 ml   Output              230 ml   Net              542 ml       Physical Exam:     General/Neurology: Alert, Awake  Head:   Normocephalic, without obvious abnormality  Eye:   PERRL, EOM intact  Oral:  Mucus membranes moist  Lung:   B/l air entry fair.  No wheezing today  Heart:   S1 S2 present  Abdomen:  Soft, non tender, BS+nt   Extremities:  No pedal edema.   Skin:   Dry, intact  Data:      Recent Results (from the  past 24 hour(s))   Glucose Whole Blood - POCT    Collection Time: 08/23/17 11:54 AM   Result Value Ref Range    POCT - Glucose Whole blood 262 (H) 70 - 100 mg/dL   ECG 12 lead    Collection Time: 08/23/17 12:20 PM   Result Value Ref Range    Ventricular Rate 71 BPM    Atrial Rate 71 BPM    P-R Interval 144 ms    QRS Duration 86 ms    Q-T Interval 428 ms    QTC Calculation (Bezet) 465 ms    P Axis 63 degrees    R Axis 9 degrees    T Axis 115 degrees   Glucose Whole Blood - POCT    Collection Time: 08/23/17  4:50 PM   Result Value Ref Range    POCT - Glucose Whole blood 226 (H) 70 - 100 mg/dL   Protein / creatinine ratio, urine    Collection Time: 08/23/17  8:40 PM   Result Value Ref Range    Urine Protein Random 55.8 (H) 1.0 - 14.0 mg/dL    Urine Creatinine, Random 165.9 None Established mg/dL    Urine Protein/Creatinine Ratio 0.3    Urinalysis with microscopic    Collection Time: 08/23/17  8:40 PM   Result Value Ref Range    Urine Type Catheterized, F     Color, UA Amber (A) Clear - Yellow    Clarity, UA Cloudy (A) Clear - Hazy    Specific Gravity UA >1.035 (A) 1.001 - 1.035    Urine pH 5.0 5.0 - 8.0    Leukocyte Esterase, UA Negative Negative    Nitrite, UA Negative Negative    Protein, UR 100 (A) Negative    Glucose, UA 50 (A) Negative    Ketones UA Negative Negative    Urobilinogen, UA Normal 0.2 - 2.0 mg/dL    Bilirubin, UA Negative Negative    Blood, UA Large (A) Negative    RBC, UA TNTC (A) 0 - 5 /hpf    WBC, UA 0-5 0 - 5 /hpf    Squamous Epithelial Cells, Urine 0-5 0 - 25 /hpf    Urine Mucus Present None   Glucose Whole Blood - POCT    Collection Time: 08/23/17 10:03 PM   Result Value Ref Range    POCT - Glucose Whole blood 190 (H) 70 - 100 mg/dL   CBC and differential    Collection Time: 08/24/17  4:19 AM   Result Value Ref Range    WBC 18.17 (H) 3.50 - 10.80 x10 3/uL    Hgb 13.4 13.0 - 17.0 g/dL    Hematocrit 60.4 54.0 - 52.0 %    Platelets 160 140 - 400 x10 3/uL    RBC 4.42 (L) 4.70 - 6.00 x10 6/uL    MCV  95.5 80.0 - 100.0 fL    MCH 30.3 28.0 - 32.0 pg    MCHC 31.8 (L) 32.0 - 36.0 g/dL    RDW 15 12 - 15 %    MPV 13.1 (H) 9.4 - 12.3 fL    Neutrophils 66.3 None %    Lymphocytes Automated 27.8 None %    Monocytes 5.0 None %  Eosinophils Automated 0.0 None %    Basophils Automated 0.1 None %    Immature Granulocyte 0.8 None %    Nucleated RBC 0.5 0.0 - 1.0 /100 WBC    Neutrophils Absolute 12.06 (H) 1.80 - 8.10 x10 3/uL    Abs Lymph Automated 5.05 (H) 0.50 - 4.40 x10 3/uL    Abs Mono Automated 0.91 0.00 - 1.20 x10 3/uL    Abs Eos Automated 0.00 0.00 - 0.70 x10 3/uL    Absolute Baso Automated 0.01 0.00 - 0.20 x10 3/uL    Absolute Immature Granulocyte 0.14 (H) 0 x10 3/uL    Absolute NRBC 0.09 (H) 0 x10 3/uL   B-type Natriuretic Peptide    Collection Time: 08/24/17  4:19 AM   Result Value Ref Range    B-Natriuretic Peptide 1,551.7 (H) 0.0 - 100.0 pg/mL   Glucose Whole Blood - POCT    Collection Time: 08/24/17  7:35 AM   Result Value Ref Range    POCT - Glucose Whole blood 203 (H) 70 - 100 mg/dL   Comprehensive metabolic panel    Collection Time: 08/24/17  7:48 AM   Result Value Ref Range    Glucose 188 (H) 70 - 100 mg/dL    BUN 16.1 (H) 9.0 - 09.6 mg/dL    Creatinine 3.7 (H) 0.7 - 1.3 mg/dL    Sodium 045 (L) 409 - 145 mEq/L    Potassium 7.0 (HH) 3.5 - 5.1 mEq/L    Chloride 104 100 - 111 mEq/L    CO2 12 (L) 22 - 29 mEq/L    Calcium 9.0 7.9 - 10.2 mg/dL    Protein, Total 6.6 6.0 - 8.3 g/dL    Albumin 3.7 3.5 - 5.0 g/dL    AST (SGOT) 811 (H) 5 - 34 U/L    ALT 214 (H) 0 - 55 U/L    Alkaline Phosphatase 91 38 - 106 U/L    Bilirubin, Total 0.6 0.2 - 1.2 mg/dL    Globulin 2.9 2.0 - 3.6 g/dL    Albumin/Globulin Ratio 1.3 0.9 - 2.2    Anion Gap 16.0 (H) 5.0 - 15.0   GFR    Collection Time: 08/24/17  7:48 AM   Result Value Ref Range    EGFR 19.7    ECG 12 lead    Collection Time: 08/24/17  8:33 AM   Result Value Ref Range    Ventricular Rate 74 BPM    Atrial Rate 74 BPM    P-R Interval 154 ms    QRS Duration 90 ms    Q-T Interval  416 ms    QTC Calculation (Bezet) 461 ms    P Axis 61 degrees    R Axis -4 degrees    T Axis 107 degrees   ECG 12 lead    Collection Time: 08/24/17  8:34 AM   Result Value Ref Range    Ventricular Rate 72 BPM    Atrial Rate 72 BPM    P-R Interval 152 ms    QRS Duration 90 ms    Q-T Interval 418 ms    QTC Calculation (Bezet) 457 ms    P Axis 55 degrees    R Axis -13 degrees    T Axis 103 degrees         Chemistry   Recent Labs      08/24/17   0748  08/23/17   0452  08/22/17   1155   Glucose  188*  206*  185*   Sodium  132*  140  141   Potassium  7.0*  4.9  5.0   Chloride  104  115*  109   CO2  12*  14*  19*   BUN  54.4*  25.0  15.0   Calcium  9.0  7.0*  9.1   Magnesium   --   1.4*  1.9   Albumin  3.7  2.8*  3.9   Globulin  2.9  2.0  3.0       CBC w/Diff   Recent Labs      08/24/17   0419  08/23/17   0353  08/22/17   1155   WBC  18.17*  10.60  10.27   RBC  4.42*  4.55*  4.73   Hgb  13.4  13.8  14.3   Hematocrit  42.2  44.4  44.6   Platelets  160  147  161       ABG     Recent Labs  Lab 08/22/17  1451   ABG CollectionSite " RIGHT WRIST"   Allen's Test Yes   Temperature 97.2   Arterial pO2 Corrected 57.0*   pH, Arterial 7.26*   pCO2, Arterial 38.1   HCO3, Arterial 17.2*   Base Excess, Arterial -10.0*   O2 Sat, Arterial 87.0*   FIO2 36.0   O2 Delivery Nasal Cannula       Micro  Invalid input(s): SDES, CULT  Invalid input(s): CULT    Xr Chest Ap Portable    Result Date: 08/23/2017  1.  Slightly increased hazy opacity in the left lower lung zone, suggestive of increased left posterior layering pleural effusion. 2.  Mild right lung base atelectasis. 3.  Small lung volumes. No frank infiltrate or significant interstitial edema. 4.  Unchanged cardiomegaly. Candi Leash, MD 08/23/2017 8:27 PM      XR (Most Recent). CXR reviewed by me and compared with previous CXR    See my orders for details     Total care time exclusive of procedures with complex decision making, coordination of care and counseling patient performed and > 50% time  spent in face to face evaluation as mentioned above.    Lynann Bologna  08/24/2017

## 2017-08-24 NOTE — Progress Notes (Signed)
[name] has continued through the night with minimal cloudy amber urine output. The eICU physician, Dr. Laural Benes, has been updated regarding the pt's status.  Plan to follow Dr. Henriette Combs recommendation to continue to monitor, check AM labs, and refer to the nephrologist's note regarding plan of care and removal of foley in the AM.         08/24/17 0245   Provider Notification   Reason for Communication Evaluate  (Diminished urine production continues. Nephro following. )   Provider Name Dr. Laural Benes   Provider Role eICU Physician   Method of Communication Call   Readback Results Yes   Response No new orders

## 2017-08-24 NOTE — Plan of Care (Signed)
Problem: Renal Instability  Goal: Perineal skin integrity is maintained or improved  Outcome: Progressing   08/24/17 0315   Goal/Interventions addressed this shift   Perineal skin integrity is maintained or improved Keep intact skin clean and dry;Apply urinary containment device as appropriate and/or per order

## 2017-08-24 NOTE — Progress Notes (Addendum)
[place] [facility]  PROGRESS NOTE  [facility]    Date Time: 08/24/17 9:52 AM  Patient Name: [name] [name], [name] [name]           Assessment:      Admitted with acute respiratory failure presumed asthma flare and possible PNA. No leukocytosis, procalcitonin negative, afebrile   Acute systolic chf, elevated BNP (1800)   ? 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  ? 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 Normal myocardial perfusion by nuclear stress testing 06/2016.    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   History of syncope in the past that has never been proven to represent ventricular arrhythmias by device interrogations.  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   Chronic dry cough   Hypomagnesemia    Recommendations:    Pt being transferred to ICU due to worsening respiratory and renal status   Planned for CRRT, pt has had limited output    Continue with daily weights   Low salt and restricted fluid diet        Medications:      Scheduled Meds: PRN Meds:      amitriptyline 25 mg Oral QHS   aspirin 81 mg Oral Daily   atorvastatin 40 mg Oral QHS   azithromycin 500 mg Oral Q24H   calcium GLUConate 1 g Intravenous Once   carvedilol 25 mg Oral Q12H SCH   fluticasone furoate-vilanterol 1 puff Inhalation QAM   guaiFENesin 600 mg Oral Q12H SCH   insulin glargine 50 Units Subcutaneous QHS   magnesium oxide 400 mg Oral BID   methylPREDNISolone 20 mg Intravenous Q6H   niacin 500 mg Oral QHS   pantoprazole 40 mg Oral QAM AC   piperacillin-tazobactam 2.25 g Intravenous Q8H   sodium bicarbonate 50 mEq Intravenous Once   tamsulosin 0.4 mg Oral QD after dinner       Continuous Infusions:  . custom IV infusion builder 65  mL/hr at 08/23/17 2214      acetaminophen 650 mg Q4H PRN   Or     acetaminophen 650 mg Q4H PRN   ALPRAZolam 0.25 mg BID PRN   benzonatate 100 mg TID PRN   calcium carbonate 500 mg 4X Daily PRN   dextrose 15 g of glucose PRN   And     dextrose 12.5 g PRN   And     glucagon (rDNA) 1 mg PRN   guaiFENesin-codeine 5 mL Q6H PRN   insulin regular 1-4 Units QHS PRN   insulin regular 1-8 Units TID AC PRN   naloxone 0.2 mg PRN   ondansetron 4 mg Q12H PRN   zolpidem 5 mg QHS PRN             Subjective:   Pt planned for transfer to ICU given worsening respiratory and kidney function with hypotension.    Physical Exam:     Vitals:    08/24/17 0900   BP: 97/86   Pulse: 74   Resp: 22   Temp:    SpO2: 100%     Temp (24hrs), Avg:97.3 F (36.3 C), Min:96.9 F (36.1 C), Max:98.2 F (36.8 C)    Weight Monitoring 01/22/2017 02/10/2017 07/12/2017  07/21/2017 08/22/2017 08/22/2017 08/23/2017   Height - - - - - 182.9 cm -   Height Method - - - - - Stated -   Weight 96.525 kg 94.892 kg 97.886 kg 95.981 kg 100.1 kg 99.2 kg 102.3 kg   Weight Method - - - - Bed Scale Bed Scale Bed Scale   BMI (calculated) - - - - - 29.7 kg/m2 -          Telemetry reviewed no changes.     Intake and Output Summary (Last 24 hours) at Date Time    Intake/Output Summary (Last 24 hours) at 08/24/17 0952  Last data filed at 08/24/17 0600   Gross per 24 hour   Intake              772 ml   Output              230 ml   Net              542 ml     General Appearance:  Breathing comfortable, no acute distress  Head:  normocephalic  Eyes:  EOM's intact  Neck:  No carotid bruit or jugular venous distension, brisk carotid upstroke  Lungs:  + rhonchi / rales, increased respiratory effort   Chest Wall:  No tenderness or deformity  Heart:  S1, S2 normal, no S3, no S4, no murmur, PMI not displaced, no rub   Abdomen:  Soft, non-tender, positive bowel sounds, no hepatojugular reflux  Extremities:  No cyanosis, clubbing or edema  Pulses:  Equal radial pulses, 4/4 symmetric  Neurologic:   Sleepy, awakens to questioning     Labs:     Recent Labs  Lab 08/22/17  2030 08/22/17  1932 08/22/17  1155   Troponin I 0.04 0.04 0.05               Recent Labs  Lab 08/24/17  0748   Bilirubin, Total 0.6   Protein, Total 6.6   Albumin 3.7   ALT 214*   AST (SGOT) 181*       Recent Labs  Lab 08/23/17  0452   Magnesium 1.4*       Recent Labs  Lab 08/22/17  1155   PT 15.1*   PT INR 1.2*   PTT 27       Recent Labs  Lab 08/24/17  0419 08/23/17  0353 08/22/17  1155   WBC 18.17* 10.60 10.27   Hgb 13.4 13.8 14.3   Hematocrit 42.2 44.4 44.6   Platelets 160 147 161       Recent Labs  Lab 08/24/17  0748 08/23/17  0452 08/22/17  1155   Sodium 132* 140 141   Potassium 7.0* 4.9 5.0   Chloride 104 115* 109   CO2 12* 14* 19*   BUN 54.4* 25.0 15.0   Creatinine 3.7* 1.8* 1.1   EGFR 19.7 45.3 >60.0   Glucose 188* 206* 185*   Calcium 9.0 7.0* 9.1     Estimated Creatinine Clearance: 23 mL/min (A) (based on SCr of 3.7 mg/dL (H)).      .  Lab Results   Component Value Date    BNP 1,551.7 (H) 08/24/2017      Imaging:         Ct Chest Wo Contrast    Result Date: 08/22/2017   1. Stable small layering pleural effusions with overlying atelectasis. 2. Numerous prominent mediastinal, axillary and suspect supraclavicular lymph nodes. Attention on follow-up. 3. Subpleural  triangular benign-appearing left upper lobe nodule. In a low risk patient no further workup is needed. Mills Koller, MD 08/22/2017 2:24 PM    Ct Abd/pelvis With Iv Contrast Only    Result Date: 08/22/2017   1. Small amount of scattered ascites with no localized inflammation. No bowel obstruction. 2. 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 3. Numerous bilateral renal cysts. Mills Koller, MD 08/22/2017 2:45 PM    Xr Chest Ap Portable    Result Date: 08/23/2017  1.  Slightly increased hazy opacity in the left lower lung zone, suggestive of increased left posterior layering pleural effusion. 2.  Mild right lung base atelectasis. 3.  Small lung volumes. No  frank infiltrate or significant interstitial edema. 4.  Unchanged cardiomegaly. Candi Leash, MD 08/23/2017 8:27 PM    Xr Chest Ap Portable    Result Date: 08/22/2017   Right basilar airspace opacities indicative of atelectasis or pneumonia in the correct clinical setting. Radiographic follow-up to full resolution recommended. Elizebeth Koller, MD 08/22/2017 12:00 PM            Signed by: Wenda Overland, PA      Patient seen and examined.  My assessment and plan as above. Heart exam: RRR, Lungs rhonchi.    Marian Sorrow, M.D., [facility]      [place] [facility]  NP Spectralink [phone] (8am-5pm)  MD Spectralink [phone] (8am-5pm)  After hours, non urgent consult line [phone]  After Hours, urgent consults [phone]

## 2017-08-24 NOTE — Brief Op Note (Signed)
BRIEF VIR PROCEDURE NOTE    Date Time: 08/24/17 12:04 PM    Patient Name:   [name] [name]    Date of Operation:   08/24/2017    Providers Performing:   Surgeon(s):  Alda Gaultney, Katherina Right, MD    Assistant (s): RT    Operative Procedure:   Procedure(s):  TRIPLE LUMEN CATH (TLC)    Preoperative Diagnosis:   Pre-Op Diagnosis Codes:     * Sepsis, due to unspecified organism [A41.9]    Postoperative Diagnosis:   * No post-op diagnosis entered *    Anesthesia:   local    Estimated Blood Loss:   Minimal    Implants:   Right IJ TLC and TDC.     Findings:   Bedside with u/s. cxr pending.     Complications:   None      Signed by: Chrystie Nose, MD                                                                [facility] CARDIAC CATH/EP    [facility]  [facility]- Division of Vascular & Interventional Radiology  [phone]  [place] - [phone]  [phone] [id number]

## 2017-08-24 NOTE — Progress Notes (Signed)
CRRT started without complication

## 2017-08-25 LAB — ECG 12-LEAD
Atrial Rate: 72 {beats}/min
Atrial Rate: 74 {beats}/min
P Axis: 55 degrees
P Axis: 61 degrees
P-R Interval: 152 ms
P-R Interval: 154 ms
Q-T Interval: 416 ms
Q-T Interval: 418 ms
QRS Duration: 90 ms
QRS Duration: 90 ms
QTC Calculation (Bezet): 457 ms
QTC Calculation (Bezet): 461 ms
R Axis: -13 degrees
R Axis: -4 degrees
T Axis: 103 degrees
T Axis: 107 degrees
Ventricular Rate: 72 {beats}/min
Ventricular Rate: 74 {beats}/min

## 2017-08-25 LAB — RENAL FUNCTION PANEL
Albumin: 2.9 g/dL — ABNORMAL LOW (ref 3.5–5.0)
Albumin: 2.8 g/dL — ABNORMAL LOW (ref 3.5–5.0)
Albumin: 3.1 g/dL — ABNORMAL LOW (ref 3.5–5.0)
Albumin: 2.9 g/dL — ABNORMAL LOW (ref 3.5–5.0)
Anion Gap: 11 (ref 5.0–15.0)
Anion Gap: 9 (ref 5.0–15.0)
Anion Gap: 13 (ref 5.0–15.0)
Anion Gap: 9 (ref 5.0–15.0)
BUN: 39.5 mg/dL — ABNORMAL HIGH (ref 9.0–28.0)
BUN: 35.1 mg/dL — ABNORMAL HIGH (ref 9.0–28.0)
BUN: 30.7 mg/dL — ABNORMAL HIGH (ref 9.0–28.0)
BUN: 26.8 mg/dL (ref 9.0–28.0)
CO2: 22 mEq/L (ref 22–29)
CO2: 26 mEq/L (ref 22–29)
CO2: 23 mEq/L (ref 22–29)
CO2: 25 mEq/L (ref 22–29)
Calcium: 9.2 mg/dL (ref 7.9–10.2)
Calcium: 9.4 mg/dL (ref 7.9–10.2)
Calcium: 9.4 mg/dL (ref 7.9–10.2)
Calcium: 9.7 mg/dL (ref 7.9–10.2)
Chloride: 103 mEq/L (ref 100–111)
Chloride: 101 mEq/L (ref 100–111)
Chloride: 101 mEq/L (ref 100–111)
Chloride: 101 mEq/L (ref 100–111)
Creatinine: 2.5 mg/dL — ABNORMAL HIGH (ref 0.7–1.3)
Creatinine: 2.2 mg/dL — ABNORMAL HIGH (ref 0.7–1.3)
Creatinine: 1.9 mg/dL — ABNORMAL HIGH (ref 0.7–1.3)
Creatinine: 1.7 mg/dL — ABNORMAL HIGH (ref 0.7–1.3)
Glucose: 155 mg/dL — ABNORMAL HIGH (ref 70–100)
Glucose: 202 mg/dL — ABNORMAL HIGH (ref 70–100)
Glucose: 270 mg/dL — ABNORMAL HIGH (ref 70–100)
Glucose: 266 mg/dL — ABNORMAL HIGH (ref 70–100)
Phosphorus: 5.2 mg/dL — ABNORMAL HIGH (ref 2.3–4.7)
Phosphorus: 4.4 mg/dL (ref 2.3–4.7)
Phosphorus: 3.6 mg/dL (ref 2.3–4.7)
Phosphorus: 3.3 mg/dL (ref 2.3–4.7)
Potassium: 4.4 mEq/L (ref 3.5–5.1)
Potassium: 4.2 mEq/L (ref 3.5–5.1)
Potassium: 4.4 mEq/L (ref 3.5–5.1)
Potassium: 4.3 mEq/L (ref 3.5–5.1)
Sodium: 136 mEq/L (ref 136–145)
Sodium: 136 mEq/L (ref 136–145)
Sodium: 137 mEq/L (ref 136–145)
Sodium: 135 mEq/L — ABNORMAL LOW (ref 136–145)

## 2017-08-25 LAB — CBC
Absolute NRBC: 0.07 10*3/uL — ABNORMAL HIGH
Hematocrit: 41.9 % — ABNORMAL LOW (ref 42.0–52.0)
Hgb: 13.7 g/dL (ref 13.0–17.0)
MCH: 30.4 pg (ref 28.0–32.0)
MCHC: 32.7 g/dL (ref 32.0–36.0)
MCV: 93.1 fL (ref 80.0–100.0)
MPV: 12.6 fL — ABNORMAL HIGH (ref 9.4–12.3)
Nucleated RBC: 0.5 /100 WBC (ref 0.0–1.0)
Platelets: 150 10*3/uL (ref 140–400)
RBC: 4.5 10*6/uL — ABNORMAL LOW (ref 4.70–6.00)
RDW: 15 % (ref 12–15)
WBC: 14.92 10*3/uL — ABNORMAL HIGH (ref 3.50–10.80)

## 2017-08-25 LAB — CALCIUM, IONIZED
Calcium, Ionized: 0.9 mEq/L — ABNORMAL LOW (ref 2.20–2.60)
Calcium, Ionized: 1.9 mEq/L — ABNORMAL LOW (ref 2.20–2.60)
Calcium, Ionized: 0.9 mEq/L — ABNORMAL LOW (ref 2.20–2.60)
Calcium, Ionized: 2.1 mEq/L — ABNORMAL LOW (ref 2.20–2.60)
Calcium, Ionized: 0.9 mEq/L — ABNORMAL LOW (ref 2.20–2.60)
Calcium, Ionized: 2 mEq/L — ABNORMAL LOW (ref 2.20–2.60)
Calcium, Ionized: 0.9 mEq/L — ABNORMAL LOW (ref 2.20–2.60)
Calcium, Ionized: 2.2 mEq/L (ref 2.20–2.60)

## 2017-08-25 LAB — MAGNESIUM
Magnesium: 1.5 mg/dL — ABNORMAL LOW (ref 1.6–2.6)
Magnesium: 1.8 mg/dL (ref 1.6–2.6)
Magnesium: 1.7 mg/dL (ref 1.6–2.6)
Magnesium: 1.7 mg/dL (ref 1.6–2.6)

## 2017-08-25 LAB — GLUCOSE WHOLE BLOOD - POCT
Whole Blood Glucose POCT: 163 mg/dL — ABNORMAL HIGH (ref 70–100)
Whole Blood Glucose POCT: 197 mg/dL — ABNORMAL HIGH (ref 70–100)
Whole Blood Glucose POCT: 204 mg/dL — ABNORMAL HIGH (ref 70–100)
Whole Blood Glucose POCT: 215 mg/dL — ABNORMAL HIGH (ref 70–100)
Whole Blood Glucose POCT: 236 mg/dL — ABNORMAL HIGH (ref 70–100)
Whole Blood Glucose POCT: 272 mg/dL — ABNORMAL HIGH (ref 70–100)

## 2017-08-25 LAB — PT/INR
PT INR: 1.5 — ABNORMAL HIGH (ref 0.9–1.1)
PT INR: 1.4 — ABNORMAL HIGH (ref 0.9–1.1)
PT INR: 1.5 — ABNORMAL HIGH (ref 0.9–1.1)
PT: 18 s — ABNORMAL HIGH (ref 12.6–15.0)
PT: 17.4 s — ABNORMAL HIGH (ref 12.6–15.0)
PT: 17.6 s — ABNORMAL HIGH (ref 12.6–15.0)

## 2017-08-25 LAB — APTT
PTT: 32 s (ref 23–37)
PTT: 33 s (ref 23–37)
PTT: 31 s (ref 23–37)
PTT: 33 s (ref 23–37)

## 2017-08-25 LAB — PT AND APTT
PT INR: 1.5 — ABNORMAL HIGH (ref 0.9–1.1)
PT: 18 s — ABNORMAL HIGH (ref 12.6–15.0)

## 2017-08-25 LAB — GFR
EGFR: 31
EGFR: 35.9
EGFR: 42.5
EGFR: 48.4

## 2017-08-25 MED ORDER — RISAQUAD PO CAPS
1.0000 | ORAL_CAPSULE | Freq: Every day | ORAL | Status: DC
Start: 2017-08-25 — End: 2017-08-25

## 2017-08-25 MED ORDER — CARVEDILOL 3.125 MG PO TABS
3.1250 mg | ORAL_TABLET | Freq: Two times a day (BID) | ORAL | Status: DC
Start: 2017-08-25 — End: 2017-08-27
  Administered 2017-08-25 – 2017-08-27 (×4): 3.125 mg via ORAL
  Filled 2017-08-25 (×4): qty 1

## 2017-08-25 MED ORDER — RISAQUAD PO CAPS
1.0000 | ORAL_CAPSULE | Freq: Every day | ORAL | Status: DC
Start: 2017-08-25 — End: 2017-08-30
  Administered 2017-08-25 – 2017-08-30 (×6): 1 via ORAL
  Filled 2017-08-25 (×6): qty 1

## 2017-08-25 MED ORDER — LIDOCAINE(URO-JET) 2% JELLY (WRAP)
Freq: Once | CUTANEOUS | Status: AC
Start: 2017-08-25 — End: 2017-08-25
  Filled 2017-08-25: qty 0.6

## 2017-08-25 MED ORDER — SODIUM CHLORIDE 0.9 % IV MBP
2.2500 g | Freq: Four times a day (QID) | INTRAVENOUS | Status: DC
Start: 2017-08-25 — End: 2017-08-28
  Administered 2017-08-25 – 2017-08-28 (×14): 2.25 g via INTRAVENOUS
  Filled 2017-08-25 (×14): qty 2.25

## 2017-08-25 NOTE — Progress Notes (Signed)
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    No SOB  No Chest pain  No Abdominal pain  No Nausea/Vomiting  No Diarrhea      Objective:   Vital signs in last 24 hours:  Temp:  [96 F (35.6 C)-98.2 F (36.8 C)] 97 F (36.1 C)  Heart Rate:  [61-91] 80  Resp Rate:  [12-29] 18  BP: (92-132)/(55-99) 132/83     General:    In NAD    HEENT:   Conjunctiva pale, Dry mucosa          CVS:  RRR, no rub        Chest:  CTA/Bilaterally,rhonchi  Abdomen:   Soft, nontender           Ext.:  Edema+      Intake/Output:   Intake/Output last 24 hours:    Intake/Output Summary (Last 24 hours) at 08/25/17 1549  Last data filed at 08/25/17 1500   Gross per 24 hour   Intake           4628.7 ml   Output             4550 ml   Net             78.7 ml     Intake/Output this shift:  I/O this shift:  In: 1530.9 [P.O.:648; I.V.:882.9]  Out: 1991 [Urine:715; Other:1276]      Labs:       Recent Labs  Lab 08/25/17  1420 08/25/17  0805 08/25/17  0212  08/24/17  2008 08/24/17  1211   Sodium 137 136 136 135* 134*   Potassium 4.4 4.2 4.4 4.4 5.5*   Chloride 101 101 103 102 103   CO2 23 26 22  20* 17*   BUN 30.7* 35.1* 39.5* 47.0* 60.0*   Creatinine 1.9* 2.2* 2.5* 2.8* 4.1*   Glucose 270* 202* 155* 141* 167*   Calcium 9.4 9.4 9.2 9.1 8.7   Magnesium 1.7 1.8 1.5* 1.8 2.0   Phosphorus 3.6 4.4 5.2* 6.5* 9.1*   EGFR 42.5 35.9 31.0 27.2 17.5  Recent Labs  Lab 08/25/17  1420 08/24/17  2008 08/24/17  1211   WBC 14.92* 17.09* 16.71*   Hgb 13.7 12.9* 12.2*   Hematocrit 41.9* 40.1* 37.9*   Platelets 150 151 132*                   Medications:   Scheduled Meds:       amitriptyline 25 mg Oral QHS   atorvastatin 40 mg Oral QHS   carvedilol 3.125 mg Oral Q12H SCH   fluticasone furoate-vilanterol 1 puff Inhalation QAM   insulin glargine 50 Units Subcutaneous QHS   insulin regular 1-8 Units Subcutaneous Q4H   lactobacillus/streptococcus 1 capsule Oral Daily   methylPREDNISolone 20 mg Intravenous Q6H   pantoprazole 40 mg Oral QAM AC   piperacillin-tazobactam 2.25 g Intravenous Q6H   tamsulosin 0.4 mg Oral QD after dinner         Continuous Infusions:  . calcium GLUConate CRRT Infusion 80 mL/hr (08/25/17 1458)   . citrate dextrose 150 mL/hr at 08/25/17 1506   . phenylephrine Stopped (08/24/17 2150)   . prismasate 0/3.5 1,500 mL/hr (08/25/17 1541)   . prismasate 2/0 1,500 mL/hr (08/25/17 1538)         PRN Meds:acetaminophen **OR** acetaminophen, ALPRAZolam, benzonatate, calcium carbonate, Nursing communication: Adult Hypoglycemia Treatment Algorithm **AND** dextrose **AND** dextrose **AND** glucagon (rDNA), magnesium sulfate, naloxone, ondansetron, potassium chloride, potassium chloride, sodium chloride (PF), sodium phosphate IVPB, sodium phosphate IVPB, zolpidem

## 2017-08-25 NOTE — Progress Notes (Signed)
[facility]- Critical Care Note     ICU Daily Progress Note        Date Time: 08/25/17 11:40 PM  Patient Name: [name] [name]  Attending Physician: Lynann Bologna, MD  Room: IC03/IC03-A   Admit Date: 08/22/2017  LOS: 3 days            Assessment:     Patient Active Problem List   Diagnosis   . Chest pain   . Diabetes mellitus   . Heart attack   . CAD (coronary artery disease)   . Non-ischemic cardiomyopathy   . Acute exacerbation of CHF (congestive heart failure)   . PUD (peptic ulcer disease)   . Elevated LFTs   . Acute dyspnea   . Elevated d-dimer   . CHF (congestive heart failure)   . Headaches due to old head injury   . Essential hypertension   . Pneumonia due to infectious organism, unspecified laterality, unspecified part of lung   . Syncope and collapse   . AICD (automatic cardioverter/defibrillator) present   . Syncope, unspecified syncope type   . Pneumonia due to infectious organism   . Dyspnea, unspecified type   . Cardiomyopathy   . Cough   . Sepsis   . AKI (acute kidney injury)   . Sepsis, due to unspecified organism     Acute renal failure undergoing CRRT.  Up-to-date greater than 600 mL required Foley catheterization  Cardiomyopathy  Hypertension, diabetes    Plan:   Treatment as outlined. CRRT  Coreg dose decrease per cardiology    OTHER:  Glycemic Control. Glucose stabilizer per ICU protocol when on insulin drip. Maintain blood glucose 140-180.   Replace electrolytes per ICU electrolyte replacement protocol  Ventilator bundle & Sedation protocol followed. Daily morning sedation holiday, assessment for readiness for SBT & weaning from ventilator; and then re-titrate if required. Aim to keep peak plateau pressure 25-30cm H2O in ARDS patient. Cass tube to suction at 20-30 cm H2O, Maintain Cass tube with 5-62ml air every 4 hours. Chlorhexidine mouth washes and routine oral care every 4 hours. Stress ulcer and DVT prophylaxis. HOB >=30 degree elevation all the time.  HOB >=30 degree  elevation all the time. Aggressive pulmonary toileting. Incentive spirometry when appropriate. Aspiration precautions.     Quality Care: Stress ulcer prophylaxis, DVT prophylaxis, HOB elevated, Infection control all reviewed and addressed.  Events and notes from last 24 hours reviewed. Care plan discussed with nursing.       CC TIME: >50 min         Subjective:   Fairly comfortable respirations, no acute distress    Medications:       Scheduled Meds: PRN Meds:      amitriptyline 25 mg Oral QHS   atorvastatin 40 mg Oral QHS   carvedilol 3.125 mg Oral Q12H SCH   fluticasone furoate-vilanterol 1 puff Inhalation QAM   insulin glargine 50 Units Subcutaneous QHS   insulin regular 1-8 Units Subcutaneous Q4H   lactobacillus/streptococcus 1 capsule Oral Daily   methylPREDNISolone 20 mg Intravenous Q6H   pantoprazole 40 mg Oral QAM AC   piperacillin-tazobactam 2.25 g Intravenous Q6H   tamsulosin 0.4 mg Oral QD after dinner       Continuous Infusions:  . calcium GLUConate CRRT Infusion 70 mL/hr (08/25/17 2031)   . citrate dextrose 150 mL (08/25/17 2113)   . phenylephrine Stopped (08/24/17 2150)   . prismasate 0/3.5 1,500 mL/hr (08/25/17 2238)   . prismasate 2/0 1,500 mL/hr (  08/25/17 2241)      acetaminophen 650 mg Q4H PRN   Or     acetaminophen 650 mg Q4H PRN   ALPRAZolam 0.25 mg BID PRN   benzonatate 100 mg TID PRN   calcium carbonate 500 mg 4X Daily PRN   dextrose 15 g of glucose PRN   And     dextrose 12.5 g PRN   And     glucagon (rDNA) 1 mg PRN   magnesium sulfate 1 g PRN   naloxone 0.2 mg PRN   ondansetron 4 mg Q12H PRN   potassium chloride 10 mEq PRN   potassium chloride 20 mEq PRN   sodium chloride (PF) 10 mL PRN   sodium phosphate IVPB 20 mmol PRN   sodium phosphate IVPB 30 mmol PRN   zolpidem 5 mg QHS PRN             Physical Exam:     Vitals:    08/25/17 2029 08/25/17 2100 08/25/17 2200 08/25/17 2300   BP: 120/80 123/67 121/79 102/74   Pulse: 71 73 76 79   Resp:  13 12 13    Temp:       TempSrc:       SpO2:  100%  100% 100%   Weight:       Height:         Temp (24hrs), Avg:97.3 F (36.3 C), Min:96.4 F (35.8 C), Max:98.2 F (36.8 C)           11/06 0701 - 11/07 0700  In: 3097.8 [P.O.:880; I.V.:2217.8]  Out: 2679        General Appearance: Alert  Mental status: Responsive  Neuro:  Nonfocal  H & N: No JVD  Lungs: Basilar rhonchi  Cardiac: Regular  Abdomen:  Soft  Extremities: No edema  Skin: Warm      Data:       Vent Settings:    Vent Settings  FiO2: 40 %      Labs:     Recent CBC   Recent Labs  Lab 08/25/17  1420 08/24/17  2008 08/24/17  1211   WBC 14.92* 17.09* 16.71*   RBC 4.50* 4.25* 3.99*   Hgb 13.7 12.9* 12.2*   Hematocrit 41.9* 40.1* 37.9*   MCV 93.1 94.4 95.0   Platelets 150 151 132*         Recent Labs  Lab 08/25/17  2001 08/25/17  1420 08/25/17  0805  08/24/17  0748  08/24/17  0419 08/23/17  0452 08/22/17  2030 08/22/17  1932 08/22/17  1155   Sodium 135* 137 136 More results in Results Review 132*  --   --  140  --   --  141   Potassium 4.3 4.4 4.2 More results in Results Review 7.0*  --   --  4.9  --   --  5.0   Chloride 101 101 101 More results in Results Review 104  --   --  115*  --   --  109   CO2 25 23 26  More results in Results Review 12*  --   --  14*  --   --  19*   Glucose 266* 270* 202* More results in Results Review 188*  --   --  206*  --   --  185*   BUN 26.8 30.7* 35.1* More results in Results Review 54.4*  --   --  25.0  --   --  15.0   Creatinine 1.7*  1.9* 2.2* More results in Results Review 3.7*  --   --  1.8*  --   --  1.1   Magnesium 1.7 1.7 1.8 More results in Results Review  --  More results in Results Review  --  1.4*  --   --  1.9   Phosphorus 3.3 3.6 4.4 More results in Results Review  --   --   --   --   --   --   --    AST (SGOT)  --   --   --   --  181*  --   --  54*  --   --  39*   ALT  --   --   --   --  214*  --   --  61*  --   --  51   Alkaline Phosphatase  --   --   --   --  91  --   --  69  --   --  99   Bilirubin, Total  --   --   --   --  0.6  --   --  1.2  --   --  1.9*    Lipase  --   --   --   --   --   --   --   --   --   --  32   B-Natriuretic Peptide  --   --   --   --   --   --  1,551.7*  --   --   --  1,828*   PT 17.6* 17.4* 18.0* More results in Results Review  --   --   --   --   --   --  15.1*   PT INR 1.5* 1.4* 1.5* More results in Results Review  --   --   --   --   --   --  1.2*   PTT 33 31 33 More results in Results Review  --   --   --   --   --   --  27   Troponin I  --   --   --   --   --   --   --   --  0.04 0.04 0.05   More results in Results Review = values in this interval not displayed.        Rads:     Radiology Results (24 Hour)     ** No results found for the last 24 hours. **          Radiological Imaging personally reviewed.    I have personally reviewed the patient's history and 24 hour interval events, along with vitals, labs, radiology images and  02 settings and additional findings found in detail within ICU team notes, with their care plans developed with and reviewed by me.     Time spent in patient evaluation and treatment in critical care excluding procedures, and not overlapping any other providers:  55   minutes.    Signed by: Durward Fortes, MD  Date/Time: 08/25/17 11:40 PM

## 2017-08-25 NOTE — Consults (Signed)
CONSULTATION    Date Time: 08/25/17 2:44 PM  Patient Name: [name] [name]  Requesting Physician: Lynann Bologna, MD        Reason for Consultation:   Rt # 2 toe abrasion    Assessment:   71 yr old male admit w/ PNA  HX:  HTN, MI, CAD, AICD, PTCA w/ stents, HLD, CHF, DM, Lt Ing Hernia repair  Insignificant shallow abrasion to Rt # 2 toe dorsal surface proximal phalanx w/ resolving eschar  Not open, No drainage    Intervention(s):   [place] consult    Plan:   Will sign off      Recent Labs  Lab 08/25/17  1420   WBC 14.92*   Hgb 13.7   Hematocrit 41.9*   Platelets 150       Recent Labs  Lab 08/25/17  0805  08/24/17  0748   Sodium 136 More results in Results Review 132*   Potassium 4.2 More results in Results Review 7.0*   Chloride 101 More results in Results Review 104   CO2 26 More results in Results Review 12*   BUN 35.1* More results in Results Review 54.4*   Creatinine 2.2* More results in Results Review 3.7*   Calcium 9.4 More results in Results Review 9.0   Albumin 2.8* More results in Results Review 3.7   Protein, Total  --   --  6.6   Bilirubin, Total  --   --  0.6   Alkaline Phosphatase  --   --  91   ALT  --   --  214*   AST (SGOT)  --   --  181*   Glucose 202* More results in Results Review 188*   More results in Results Review = values in this interval not displayed.                Specialty Bed:    [facility]  Summary:   [name] [name] is a 71 y.o. male who presents to the hospital on 08/22/2017 with   Patient Active Problem List   Diagnosis   . Chest pain   . Diabetes mellitus   . Heart attack   . CAD (coronary artery disease)   . Non-ischemic cardiomyopathy   . Acute exacerbation of CHF (congestive heart failure)   . PUD (peptic ulcer disease)   . Elevated LFTs   . Acute dyspnea   . Elevated d-dimer   . CHF (congestive heart failure)   . Headaches due to old head injury   . Essential hypertension   . Pneumonia due to infectious organism, unspecified laterality, unspecified part of lung   . Syncope  and collapse   . AICD (automatic cardioverter/defibrillator) present   . Syncope, unspecified syncope type   . Pneumonia due to infectious organism   . Dyspnea, unspecified type   . Cardiomyopathy   . Cough   . Sepsis   . AKI (acute kidney injury)   . Sepsis, due to unspecified organism       Past Medical History:     Past Medical History:   Diagnosis Date   . AICD (automatic cardioverter/defibrillator) present 2014   . CAD (coronary artery disease)    . Cardiac defibrillator in situ    . CHF (congestive heart failure) 08/12/2014   . Diabetes mellitus    . Heart attack 2011    and 2014   . Hyperlipemia    . Hypertension    . Ischemic cardiomyopathy  20%   . Pneumonia 09/2016       Past Surgical History:     Past Surgical History:   Procedure Laterality Date   . CARDIAC CATHETERIZATION     . CARDIAC DEFIBRILLATOR PLACEMENT     . CARDIAC PACEMAKER PLACEMENT     . CIRCUMCISION     . CORONARY STENT PLACEMENT  2011   . duodenal ulcer  1973   . EGD N/A 08/15/2014    Procedure: EGD;  Surgeon: Colon Branch, MD;  Location[facility];  Service: Gastroenterology;  Laterality: N/A;  egd w/ bx   . HERNIA REPAIR      [facility]   . orthopedic surgery      R foot corrective   . TONSILLECTOMY AND ADENOIDECTOMY  1954       Allergies:   No Known Allergies      Maretta Bees, RN  [facility]  Pager # [phone]

## 2017-08-25 NOTE — UM Notes (Signed)
Hello    REF# [id number]  Clinical update / change in status - to ICU 08/24/17 - 08/25/17  C/b to [name] [phone]    Thank-you!        To ICU 08/24/17 with progressive anuric acute renal failure (Cr 3.7 - 4.1) associated with severe acidosis (CO2 12) and hyperkalemia (K 7.0) req CRRT and s/p IR placement of TLC and Quinton cath.        In ICU 08/25/17 :  96.8, HR 91, RR 24-29, BP 132/83, sats 98% O2 2L NC  Mg 1.5, Glucose 270, BUN 39.5, Cr 2.5, PTINR 17.4/1.4, WBC 14.92, Hcrit 41.9, RBC 4.50 - replace Mg : 1g IV Mg xs 1 rider      Per Cardio 08/25/17 :  Assessment:      Admitted with acute respiratory failure presumed asthma flare and possible PNA. No leukocytosis, procalcitonin negative, afebrile   Acute systolic chf, elevated BNP (1800)   ? 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  ? 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 Normal myocardial perfusion by nuclear stress testing 06/2016.    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   History of syncope in the past that has never been proven to represent ventricular arrhythmias by device interrogations. 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   Chronic dry cough   Hypomagnesemia    Recommendations:    Decrease carvedilol dose and will increase back up as BP increases   Continue to remove fluid with CRRT      MD assessment/plan 08/25/17 :  Acute renal failure undergoing CRRT.  Up-to-date greater than 600 mL required Foley catheterization  Cardiomyopathy  Hypertension, diabetes    Plan:   Treatment as outlined. CRRT  Coreg dose decrease per cardiology        ORDERS : ICU, Nephro, Cardio, po Elavil, Lipitor, Coreg, Breo, SC Lantus 50units daily, SC HumuLin  1-8units q4, po Risaquad, 20mg  IV Solu Medrol q6, po Protonix, 2.25g IV Zosyn q6, po Flomax, CRRT with citrate, IV calcium gluconate gtt, IV Citrate dextrose, prn Tylenol, prn Xanax, prn Tessalon, diet as tol, bedrest, daily weights, I/O, supp O2, VS q4

## 2017-08-25 NOTE — Progress Notes (Signed)
Pt sleepy with little PO intake, advised Dr. Lesle Reek due to concern for giving full Lantus dose. Pt given 20 units of Lantus instead per Dr. Lesle Reek.

## 2017-08-25 NOTE — Progress Notes (Addendum)
1113-Called Dr. Roswell Nickel to notify her that pt's BP is stable off vasopressors and inquired if she would like to begin fluid removal. Per Dr. Roswell Nickel, begin a net negative of 50 mL per hour.

## 2017-08-25 NOTE — Progress Notes (Signed)
[place] [facility]  PROGRESS NOTE  [facility]    Date Time: 08/25/17 10:02 AM  Patient Name: WADDELL, ITEN           Assessment:      Admitted with acute respiratory failure presumed asthma flare and possible PNA. No leukocytosis, procalcitonin negative, afebrile   Acute systolic chf, elevated BNP (1800)   ? 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  ? 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 Normal myocardial perfusion by nuclear stress testing 06/2016.    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   History of syncope in the past that has never been proven to represent ventricular arrhythmias by device interrogations.  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   Chronic dry cough   Hypomagnesemia    Recommendations:    Decrease carvedilol dose and will increase back up as BP increases   Continue to remove fluid with CRRT        Medications:      Scheduled Meds: PRN Meds:        amitriptyline 25 mg Oral QHS   atorvastatin 40 mg Oral QHS   carvedilol 25 mg Oral Q12H SCH   fluticasone furoate-vilanterol 1 puff Inhalation QAM   insulin glargine 50 Units Subcutaneous QHS   insulin regular 1-8 Units Subcutaneous Q4H   methylPREDNISolone 20 mg Intravenous Q6H   pantoprazole 40 mg Oral QAM AC   piperacillin-tazobactam 2.25 g Intravenous Q8H [facility]   tamsulosin 0.4 mg Oral QD after dinner       Continuous Infusions:  . calcium GLUConate CRRT Infusion 70 mL/hr (08/25/17 0855)   . citrate dextrose 150 mL/hr at 08/25/17 0851   . phenylephrine Stopped (08/24/17 2150)   . prismasate 0/3.5 1,500 mL/hr (08/25/17 0816)   . prismasate 2/0 1,500 mL/hr (08/25/17 0819)      acetaminophen 650 mg Q4H PRN   Or     acetaminophen 650  mg Q4H PRN   ALPRAZolam 0.25 mg BID PRN   benzonatate 100 mg TID PRN   calcium carbonate 500 mg 4X Daily PRN   dextrose 15 g of glucose PRN   And     dextrose 12.5 g PRN   And     glucagon (rDNA) 1 mg PRN   magnesium sulfate 1 g PRN   naloxone 0.2 mg PRN   ondansetron 4 mg Q12H PRN   potassium chloride 10 mEq PRN   potassium chloride 20 mEq PRN   sodium chloride (PF) 10 mL PRN   sodium phosphate IVPB 20 mmol PRN   sodium phosphate IVPB 30 mmol PRN   zolpidem 5 mg QHS PRN             Subjective:   Pt planned for transfer to ICU given worsening respiratory and kidney function with hypotension.    Physical Exam:     Vitals:    08/25/17 0916   BP:    Pulse: 86   Resp: (!) 24   Temp: (!) 96.9 F (36.1 C)   SpO2: 98%     Temp (24hrs), Avg:97.1 F (36.2 C), Min:96 F (35.6 C), Max:98.2 F (36.8 C)    Weight Monitoring 02/10/2017 07/12/2017 07/21/2017  08/22/2017 08/22/2017 08/23/2017 08/25/2017   Height - - - - 182.9 cm - -   Height Method - - - - Stated - -   Weight 94.892 kg 97.886 kg 95.981 kg 100.1 kg 99.2 kg 102.3 kg 104.3 kg   Weight Method - - - Bed Scale Bed Scale Bed Scale Bed Scale   BMI (calculated) - - - - 29.7 kg/m2 - -          Telemetry reviewed no changes.     Intake and Output Summary (Last 24 hours) at Date Time    Intake/Output Summary (Last 24 hours) at 08/25/17 1002  Last data filed at 08/25/17 0900   Gross per 24 hour   Intake           3381.4 ml   Output             2772 ml   Net            609.4 ml     General Appearance:  Breathing comfortable, no acute distress  Head:  normocephalic  Eyes:  EOM's intact  Neck:  No carotid bruit or jugular venous distension, brisk carotid upstroke  Lungs:  + rhonchi / rales, increased respiratory effort   Chest Wall:  No tenderness or deformity  Heart:  S1, S2 normal, no S3, no S4, no murmur, PMI not displaced, no rub   Abdomen:  Soft, non-tender, positive bowel sounds, no hepatojugular reflux  Extremities:  No cyanosis, clubbing or edema  Pulses:  Equal radial pulses,  4/4 symmetric  Neurologic:  Sleepy, awakens to questioning     Labs:       Recent Labs  Lab 08/22/17  2030 08/22/17  1932 08/22/17  1155   Troponin I 0.04 0.04 0.05               Recent Labs  Lab 08/25/17  0805  08/24/17  0748   Bilirubin, Total  --   --  0.6   Protein, Total  --   --  6.6   Albumin 2.8* More results in Results Review 3.7   ALT  --   --  214*   AST (SGOT)  --   --  181*   More results in Results Review = values in this interval not displayed.    Recent Labs  Lab 08/25/17  0805   Magnesium 1.8       Recent Labs  Lab 08/25/17  0805   PT 18.0*   PT INR 1.5*   PTT 33       Recent Labs  Lab 08/24/17  2008 08/24/17  1211 08/24/17  0419   WBC 17.09* 16.71* 18.17*   Hgb 12.9* 12.2* 13.4   Hematocrit 40.1* 37.9* 42.2   Platelets 151 132* 160       Recent Labs  Lab 08/25/17  0805 08/25/17  0212 08/24/17  2008   Sodium 136 136 135*   Potassium 4.2 4.4 4.4   Chloride 101 103 102   CO2 26 22 20*   BUN 35.1* 39.5* 47.0*   Creatinine 2.2* 2.5* 2.8*   EGFR 35.9 31.0 27.2   Glucose 202* 155* 141*   Calcium 9.4 9.2 9.1     Estimated Creatinine Clearance: 39 mL/min (A) (based on SCr of 2.2 mg/dL (H)).      .  Lab Results   Component Value Date    BNP 1,551.7 (H) 08/24/2017      Imaging:  Ct Chest Wo Contrast    Result Date: 08/22/2017   1. Stable small layering pleural effusions with overlying atelectasis. 2. Numerous prominent mediastinal, axillary and suspect supraclavicular lymph nodes. Attention on follow-up. 3. Subpleural triangular benign-appearing left upper lobe nodule. In a low risk patient no further workup is needed. Mills Koller, MD 08/22/2017 2:24 PM    Ct Abd/pelvis With Iv Contrast Only    Result Date: 08/22/2017   1. Small amount of scattered ascites with no localized inflammation. No bowel obstruction. 2. 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 3. Numerous bilateral renal cysts. Mills Koller, MD 08/22/2017 2:45 PM    Xr Chest Ap Portable    Result Date: 08/24/2017    New right-sided central lines. Wynema Birch, MD 08/24/2017 12:58 PM    Xr Chest Ap Portable    Result Date: 08/24/2017   Congestive heart failure. Small pleural effusions are suspected layering posteriorly because of supine positioning. Wynema Birch, MD 08/24/2017 10:16 AM    Xr Chest Ap Portable    Result Date: 08/23/2017  1.  Slightly increased hazy opacity in the left lower lung zone, suggestive of increased left posterior layering pleural effusion. 2.  Mild right lung base atelectasis. 3.  Small lung volumes. No frank infiltrate or significant interstitial edema. 4.  Unchanged cardiomegaly. Candi Leash, MD 08/23/2017 8:27 PM    Xr Chest Ap Portable    Result Date: 08/22/2017   Right basilar airspace opacities indicative of atelectasis or pneumonia in the correct clinical setting. Radiographic follow-up to full resolution recommended. Elizebeth Koller, MD 08/22/2017 12:00 PM    Non-tunneled Cath Placement (tdc)    Result Date: 08/24/2017    Successful placement of a 16 cm temporary hemodialysis access catheter via right internal jugular venous approach under ultrasound guidance.  A chest x-ray will be obtained to assess tip position.   Lemar Livings, MD 08/24/2017 4:11 PM    Triple Lumen Cath (tlc)    Result Date: 08/24/2017   Successful placement of a 7 French triple lumen central venous access catheter via right internal jugular  approach under ultrasound guidance.  A chest x-ray will be obtained to assess tip position.  Lemar Livings, MD 08/24/2017 4:12 PM            Sherlean Foot, M.D., [facility]      [place] [facility]  NP Spectralink [phone] (8am-5pm)  MD Spectralink [phone] (8am-5pm)  After hours, non urgent consult line [phone]  After Hours, urgent consults [phone]

## 2017-08-25 NOTE — Plan of Care (Signed)
Problem: Hemodynamic Status: Cardiac  Goal: Stable vital signs and fluid balance  Outcome: Progressing   08/25/17 1709   Goal/Interventions addressed this shift   Stable vital signs and fluid balance Monitor/assess vital signs and telemetry per unit protocol;Weigh on admission and record weight daily;Assess signs and symptoms associated with cardiac rhythm changes;Monitor for leg swelling/edema and report to LIP if abnormal;Monitor lab values;Monitor intake/output per unit protocol and/or LIP order       Problem: Renal Instability  Goal: Fluid and electrolyte balance are achieved/maintained  Outcome: Progressing   08/25/17 1709   Goal/Interventions addressed this shift   Fluid and electrolyte balance are achieved/maintained  Monitor intake and output every shift;Monitor/assess lab values and report abnormal values;Monitor daily weight;Provide adequate hydration;Assess for confusion/personality changes;Assess and reassess fluid and electrolyte status;Observe for seizure activity and initiate seizure precautions if indicated;Monitor for muscle weakness;Follow fluid restrictions/IV/PO parameters;Observe for cardiac arrhythmias       Comments: Continuing CRRT. Pt tolerating well. VSS. No vasopressors needed today. Pt alert and oriented. Foley placed for urinary retention with adequate urine output post placement. Wife updated at bedside.

## 2017-08-25 NOTE — Plan of Care (Signed)
Notified MK in lab that ionized calcium post filter result not seen in epic. She stated result for this time is 0.90.

## 2017-08-25 NOTE — Progress Notes (Signed)
1050-Notified Dr. Lesle Reek that pt had a bladder scan result of 567 mL in bladder and pt only able to void 5 mL and that he has pain starting stream. Per MD, will reevaluate this afternoon and do a straight cath if needed. He discussed all above with pt.   1400- Notified Dr. Lesle Reek that pt still has voided very little and bladder scan reveals >600 mL. Per Dr. Lesle Reek RN to place foley catheter and utilize urojet.

## 2017-08-26 LAB — MAGNESIUM
Magnesium: 1.7 mg/dL (ref 1.6–2.6)
Magnesium: 1.7 mg/dL (ref 1.6–2.6)
Magnesium: 1.9 mg/dL (ref 1.6–2.6)

## 2017-08-26 LAB — CALCIUM, IONIZED
Calcium, Ionized: 0.9 mEq/L — ABNORMAL LOW (ref 2.20–2.60)
Calcium, Ionized: 2.1 mEq/L — ABNORMAL LOW (ref 2.20–2.60)
Calcium, Ionized: 1 mEq/L — ABNORMAL LOW (ref 2.20–2.60)
Calcium, Ionized: 2.3 mEq/L (ref 2.20–2.60)

## 2017-08-26 LAB — GFR
EGFR: 55.9
EGFR: >60
EGFR: 55.9

## 2017-08-26 LAB — RENAL FUNCTION PANEL
Albumin: 2.8 g/dL — ABNORMAL LOW (ref 3.5–5.0)
Albumin: 2.8 g/dL — ABNORMAL LOW (ref 3.5–5.0)
Anion Gap: 13 (ref 5.0–15.0)
Anion Gap: 6 (ref 5.0–15.0)
BUN: 23.5 mg/dL (ref 9.0–28.0)
BUN: 17.9 mg/dL (ref 9.0–28.0)
CO2: 23 mEq/L (ref 22–29)
CO2: 30 mEq/L — ABNORMAL HIGH (ref 22–29)
Calcium: 9.5 mg/dL (ref 7.9–10.2)
Calcium: 10 mg/dL (ref 7.9–10.2)
Chloride: 101 mEq/L (ref 100–111)
Chloride: 102 mEq/L (ref 100–111)
Creatinine: 1.5 mg/dL — ABNORMAL HIGH (ref 0.7–1.3)
Creatinine: 1.2 mg/dL (ref 0.7–1.3)
Glucose: 245 mg/dL — ABNORMAL HIGH (ref 70–100)
Glucose: 137 mg/dL — ABNORMAL HIGH (ref 70–100)
Phosphorus: 3 mg/dL (ref 2.3–4.7)
Phosphorus: 2.9 mg/dL (ref 2.3–4.7)
Potassium: 4 mEq/L (ref 3.5–5.1)
Potassium: 3.5 mEq/L (ref 3.5–5.1)
Sodium: 137 mEq/L (ref 136–145)
Sodium: 138 mEq/L (ref 136–145)

## 2017-08-26 LAB — GLUCOSE WHOLE BLOOD - POCT
Whole Blood Glucose POCT: 210 mg/dL — ABNORMAL HIGH (ref 70–100)
Whole Blood Glucose POCT: 151 mg/dL — ABNORMAL HIGH (ref 70–100)
Whole Blood Glucose POCT: 181 mg/dL — ABNORMAL HIGH (ref 70–100)
Whole Blood Glucose POCT: 229 mg/dL — ABNORMAL HIGH (ref 70–100)
Whole Blood Glucose POCT: 288 mg/dL — ABNORMAL HIGH (ref 70–100)
Whole Blood Glucose POCT: 327 mg/dL — ABNORMAL HIGH (ref 70–100)

## 2017-08-26 LAB — CBC AND DIFFERENTIAL
Absolute NRBC: 0.06 10*3/uL — ABNORMAL HIGH
Basophils Absolute Automated: 0.01 10*3/uL (ref 0.00–0.20)
Basophils Automated: 0.1 %
Eosinophils Absolute Automated: 0.01 10*3/uL (ref 0.00–0.70)
Eosinophils Automated: 0.1 %
Hematocrit: 38.9 % — ABNORMAL LOW (ref 42.0–52.0)
Hgb: 12.7 g/dL — ABNORMAL LOW (ref 13.0–17.0)
Immature Granulocytes Absolute: 0.13 10*3/uL — ABNORMAL HIGH
Immature Granulocytes: 1 %
Lymphocytes Absolute Automated: 1.28 10*3/uL (ref 0.50–4.40)
Lymphocytes Automated: 10.3 %
MCH: 30.7 pg (ref 28.0–32.0)
MCHC: 32.6 g/dL (ref 32.0–36.0)
MCV: 94 fL (ref 80.0–100.0)
MPV: 13 fL — ABNORMAL HIGH (ref 9.4–12.3)
Monocytes Absolute Automated: 0.72 10*3/uL (ref 0.00–1.20)
Monocytes: 5.8 %
Neutrophils Absolute: 10.25 10*3/uL — ABNORMAL HIGH (ref 1.80–8.10)
Neutrophils: 82.7 %
Nucleated RBC: 0.5 /100 WBC (ref 0.0–1.0)
Platelets: 130 10*3/uL — ABNORMAL LOW (ref 140–400)
RBC: 4.14 10*6/uL — ABNORMAL LOW (ref 4.70–6.00)
RDW: 15 % (ref 12–15)
WBC: 12.4 10*3/uL — ABNORMAL HIGH (ref 3.50–10.80)

## 2017-08-26 LAB — PT AND APTT
PT INR: 1.4 — ABNORMAL HIGH (ref 0.9–1.1)
PT: 17.1 s — ABNORMAL HIGH (ref 12.6–15.0)
PTT: 35 s (ref 23–37)

## 2017-08-26 LAB — BASIC METABOLIC PANEL
Anion Gap: 6 (ref 5.0–15.0)
BUN: 22.6 mg/dL (ref 9.0–28.0)
CO2: 28 mEq/L (ref 22–29)
Calcium: 9.4 mg/dL (ref 7.9–10.2)
Chloride: 101 mEq/L (ref 100–111)
Creatinine: 1.5 mg/dL — ABNORMAL HIGH (ref 0.7–1.3)
Glucose: 237 mg/dL — ABNORMAL HIGH (ref 70–100)
Potassium: 4.6 mEq/L (ref 3.5–5.1)
Sodium: 135 mEq/L — ABNORMAL LOW (ref 136–145)

## 2017-08-26 LAB — PT/INR
PT INR: 1.4 — ABNORMAL HIGH (ref 0.9–1.1)
PT: 16.9 s — ABNORMAL HIGH (ref 12.6–15.0)

## 2017-08-26 LAB — APTT: PTT: 32 s (ref 23–37)

## 2017-08-26 MED ORDER — DOCUSATE SODIUM 100 MG PO CAPS
100.0000 mg | ORAL_CAPSULE | Freq: Two times a day (BID) | ORAL | Status: DC
Start: 2017-08-26 — End: 2017-08-30
  Administered 2017-08-26 – 2017-08-30 (×9): 100 mg via ORAL
  Filled 2017-08-26 (×9): qty 1

## 2017-08-26 MED ORDER — INSULIN LISPRO 100 UNIT/ML SC SOLN
1.0000 [IU] | Freq: Every evening | SUBCUTANEOUS | Status: DC
Start: 2017-08-26 — End: 2017-08-30
  Administered 2017-08-26 – 2017-08-29 (×4): 4 [IU] via SUBCUTANEOUS
  Filled 2017-08-26: qty 12
  Filled 2017-08-26: qty 21
  Filled 2017-08-26 (×3): qty 12

## 2017-08-26 MED ORDER — MAGNESIUM SULFATE IN D5W 1-5 GM/100ML-% IV SOLN
1.0000 g | Freq: Once | INTRAVENOUS | Status: AC
Start: 2017-08-26 — End: 2017-08-26
  Administered 2017-08-26: 1 g via INTRAVENOUS
  Filled 2017-08-26: qty 100

## 2017-08-26 MED ORDER — MAGNESIUM SULFATE IN D5W 1-5 GM/100ML-% IV SOLN
1.0000 g | INTRAVENOUS | Status: AC
Start: 2017-08-26 — End: 2017-08-26
  Administered 2017-08-26 (×2): 1 g via INTRAVENOUS
  Filled 2017-08-26: qty 200

## 2017-08-26 MED ORDER — INSULIN LISPRO 100 UNIT/ML SC SOLN
1.0000 [IU] | Freq: Three times a day (TID) | SUBCUTANEOUS | Status: DC
Start: 2017-08-26 — End: 2017-08-30
  Administered 2017-08-26: 1 [IU] via SUBCUTANEOUS
  Administered 2017-08-26 – 2017-08-27 (×2): 3 [IU] via SUBCUTANEOUS
  Administered 2017-08-27: 12:00:00 5 [IU] via SUBCUTANEOUS
  Administered 2017-08-27 – 2017-08-28 (×3): 7 [IU] via SUBCUTANEOUS
  Administered 2017-08-28 – 2017-08-29 (×3): 3 [IU] via SUBCUTANEOUS
  Administered 2017-08-30: 08:00:00 1 [IU] via SUBCUTANEOUS
  Administered 2017-08-30: 17:00:00 7 [IU] via SUBCUTANEOUS
  Administered 2017-08-30: 3 [IU] via SUBCUTANEOUS
  Filled 2017-08-26: qty 21
  Filled 2017-08-26: qty 9
  Filled 2017-08-26: qty 3
  Filled 2017-08-26 (×3): qty 9
  Filled 2017-08-26: qty 21
  Filled 2017-08-26: qty 9
  Filled 2017-08-26: qty 3
  Filled 2017-08-26: qty 15
  Filled 2017-08-26: qty 21
  Filled 2017-08-26: qty 9

## 2017-08-26 NOTE — Progress Notes (Signed)
[facility]- Critical Care Note     ICU Daily Progress Note        Date Time: 08/26/17 11:04 AM  Patient Name: [name] [name]  Attending Physician: Lynann Bologna, MD  Room: IC03/IC03-A   Admit Date: 08/22/2017  LOS: 4 days        Assessment/Plan:   #Neuro: Metabolic encephalopathy, slowly improving.     #Cardio: Nonischemic cardiomyopathy EF30%, acute on chronic systolic congestive heart failure, chronic diastolic heart failure,  automatic implantable cardiac defibrillator placement, hypertension, hyperlipidemia, patient has intermittent ventricular ectopy.  Replace electrolytes.  Currently tolerating Coreg.     #Resp: Suspect patient has underlying sleep apnea based on episodes of hypoxia during sleep.  Patient will benefit from outpatient sleep study when stable.   Aspiration pneumonia, cont abx    #GI: no current abd pain. abd ct indicate no acute processes.     #Infectious Disease (ID): Aspiration pneumonia.  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  No signs of active bleed.  cont to monitor  Thrombocytopenia, in setting of CRRT. Will see if plt comes back up, off CRRT.     #Renal /Fluid, Electrolytes : Acute kidney injury, and a setting of CKD, stage III. 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  Finished CRRT. Kidneys recovering.      #Endo:  Diabetes mellitus type II, cont SSI and cont lantus    #Nutrition: tolerating po intake    #Prophylaxis:   VTE Prophylaxis:hold for thrombocytopenia and coagulopathy      Subjective:       71 yo AAM h/o HTN, HLD, DM, CAD, AICD (2014), ischemic cardiomyopathy 20%, presents with worsening shortness of breath and abdominal pain.  Pt had hypotension and iv contrast for evaluation of abdominal pain.  He developed AKI requiring CRRT.  Patient improved kidney function and able to come off  CRRT.      Patient awake, interactive. He states he feels better. No abdominal pain during my evaluation.     Hospital course  11/04 IMC-- Sepsis/ PNA (wbc=10.27, lactic=3?4,   cxr= Rt  basilar airspace opacities)  , CHF (bnp=1828)  w/ resp distress  ~ Lasix , IV bolus given , AKI (bun=25, cr=1.8)   . 11/4 CT abd & pelvis: Small amt  scattered ascites w/  no localized inflammation. No bowel obstruction. Rt heart disease w/  reflux of contrast into the hepatic veins. Numerous bilateral renal cysts.  11/6  ICU  worsening Renal Failure (bun=60, cr=4.1) , Elevated LFT's (ast= 181alt=-214)   Repeat BC  x 1 set.  CRRT started   11/8 CRRT stopped       Medications:   Scheduled Meds:  Current Facility-Administered Medications   Medication Dose Route Frequency   . amitriptyline  25 mg Oral QHS   . atorvastatin  40 mg Oral QHS   . carvedilol  3.125 mg Oral Q12H SCH   . fluticasone furoate-vilanterol  1 puff Inhalation QAM   . insulin glargine  50 Units Subcutaneous QHS   . insulin regular  1-8 Units Subcutaneous Q4H   . lactobacillus/streptococcus  1 capsule Oral Daily   . methylPREDNISolone  20 mg Intravenous Q6H   . pantoprazole  40 mg Oral QAM AC   . piperacillin-tazobactam  2.25 g Intravenous Q6H   . tamsulosin  0.4 mg  Oral QD after dinner         Continuous Infusions:  . calcium GLUConate CRRT Infusion 60 mL/hr (08/26/17 0908)   . citrate dextrose 150 mL (08/26/17 0933)   . phenylephrine Stopped (08/24/17 2150)   . prismasate 0/3.5 1,500 mL/hr (08/26/17 0538)   . prismasate 2/0 1,500 mL/hr (08/26/17 0540)          Physical Exam:     Vitals:    08/26/17 1000   BP: (!) 132/105   Pulse: 84   Resp: 20   Temp:    SpO2: 100%         Intake/Output Summary (Last 24 hours) at 08/26/17 1104  Last data filed at 08/26/17 1000   Gross per 24 hour   Intake           3786.1 ml   Output             5139 ml   Net          -1352.9 ml     General Appearance: improved mental status compared to chart review. Alert and interactive, oriented  x3  CV: ns1, ns2, no m/r/g   Pulm: Clear to auscultation bilaterally  Abd: Nontender, nondistended, positive bowel sounds, central obesity  Extremities: no lower extremity edema      Data:     Vent Settings:    Vent Settings  FiO2: 40 %        Labs:     Labs (last 72 hours):  Recent Labs      08/26/17   0145  08/25/17   1420  08/24/17   2008   WBC  12.40*  14.92*  17.09*   Hgb  12.7*  13.7  12.9*   Hematocrit  38.9*  41.9*  40.1*     Recent Labs      08/26/17   0812  08/26/17   0142  08/25/17   2001   PT  16.9*  17.1*  17.6*   PT INR  1.4*  1.4*  1.5*   PTT  32  35  33    Recent Labs      08/26/17   0812  08/26/17   0142  08/25/17   2001   Sodium  138  137  135*   Potassium  3.5  4.0  4.3   Chloride  102  101  101   CO2  30*  23  25   BUN  17.9  23.5  26.8   Creatinine  1.2  1.5*  1.7*   Glucose  137*  245*  266*   Calcium  10.0  9.5  9.7   Magnesium  1.7  1.7  1.7   Phosphorus  2.9  3.0  3.3                 Rads:       Radiological Imaging personally reviewed,and agree with radiology report including:         CXR    08/24/2017    "Congestive heart failure. Small pleural effusions are suspected layering posteriorly because of supine positioning."      Abdomen pelvic CT     08/22/2017    "1. Small amount of scattered ascites with no localized inflammation. No  bowel obstruction.    2. 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    3. Numerous bilateral renal cysts."       Chest Ct without contrast  08/22/2017    "1. Stable small layering pleural effusions with overlying atelectasis.    2. Numerous prominent mediastinal, axillary and suspect supraclavicular  lymph nodes. Attention on follow-up.    3. Subpleural triangular benign-appearing left upper lobe nodule. In a  low risk patient no further workup is needed. "    I have personally reviewed the patient's history and 24 hour interval events, along with vitals, labs, radiology images and nursing. So far today I have spent _43__ minutes  providing care for this patient excluding teaching and billable procedures, and not overlapping with any other providers.          Signed by: Lawernce Ion, MD  Date/Time: 08/26/17 11:04 AM    *This note was generated by the Epic EMR system/ Dragon speech recognition and may contain inherent errors or omissions not intended by the user. Grammatical errors, random word insertions, deletions, pronoun errors and incomplete sentences are occasional consequences of this technology due to software limitations. Not all errors are caught or corrected. If there are questions or concerns about the content of this note or information contained within the body of this dictation they should be addressed directly with the author for clarification

## 2017-08-26 NOTE — Plan of Care (Signed)
Problem: Renal Instability  Goal: Fluid and electrolyte balance are achieved/maintained  Outcome: Progressing  CRRT stopped at 1000 per Dr. Roswell Nickel nephrologist.  Patients vitals have been stable with some cardiac ectopy.  Replaced potassium of 3.5 w/ and Mg of 1.7 with 2gm.Patient is alert oriented and has had good appetite.  Patient will need some education of diabetes management especially in the area of diet.  Updated patients wife on patients current condition, she will visiting this evening.    08/26/17 1337   Goal/Interventions addressed this shift   Fluid and electrolyte balance are achieved/maintained  Monitor intake and output every shift;Monitor/assess lab values and report abnormal values;Provide adequate hydration;Monitor daily weight;Assess for confusion/personality changes;Assess and reassess fluid and electrolyte status;Observe for seizure activity and initiate seizure precautions if indicated;Observe for cardiac arrhythmias;Monitor for muscle weakness;Follow fluid restrictions/IV/PO parameters

## 2017-08-26 NOTE — Progress Notes (Signed)
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  -Will leave dialysis cath in for now.  -Daily renal fx  -will start lasix 40 mg IV BID  -No ACEI,can increase coreg as BP allows  D/WDr.Lee and nurse          Eddie North, M.D  [facility] [place]  [phone]  Subjective:   No new complaints.  He is in good spirits and feels well    No SOB  No Chest pain  No Abdominal pain  No Nausea/Vomiting  No Diarrhea      Objective:   Vital signs in last 24 hours:  Temp:  [96.4 F (35.8 C)-97 F (36.1 C)] 96.4 F (35.8 C)  Heart Rate:  [71-91] 84  Resp Rate:  [12-29] 20  BP: (102-146)/(60-106) 132/105     General:    In NAD,IJ cath    HEENT:   Conjunctiva pale, Dry mucosa          CVS:  RRR, no rub        Chest:  CTA/Bilaterally  Abdomen:   Soft, nontender           Ext.:   +edema      Intake/Output:   Intake/Output last 24 hours:    Intake/Output Summary (Last 24 hours) at 08/26/17 1015  Last data filed at 08/26/17 1000   Gross per 24 hour   Intake           3904.2 ml   Output             5278 ml   Net          -1373.8 ml     Intake/Output this shift:  I/O this shift:  In: 302.8 [I.V.:302.8]  Out: 385 [Urine:245; Other:140]      Labs:       Recent Labs  Lab  08/26/17  0812 08/26/17  0142 08/25/17  2001 08/25/17  1420 08/25/17  0805   Sodium 138 137 135* 137 136   Potassium 3.5 4.0 4.3 4.4 4.2   Chloride 102 101 101 101 101   CO2 30* 23 25 23 26    BUN 17.9 23.5 26.8 30.7* 35.1*   Creatinine 1.2 1.5* 1.7* 1.9* 2.2*   Glucose 137* 245* 266* 270* 202*   Calcium 10.0 9.5 9.7 9.4 9.4   Magnesium 1.7 1.7 1.7 1.7 1.8   Phosphorus 2.9 3.0 3.3 3.6 4.4   EGFR >60.0 55.9 48.4 42.5 35.9  Recent Labs  Lab 08/26/17  0145 08/25/17  1420 08/24/17  2008   WBC 12.40* 14.92* 17.09*   Hgb 12.7* 13.7 12.9*   Hematocrit 38.9* 41.9* 40.1*   Platelets 130* 150 151                   Medications:   Scheduled Meds:       amitriptyline 25 mg Oral QHS   atorvastatin 40 mg Oral QHS   carvedilol 3.125 mg Oral Q12H SCH   fluticasone furoate-vilanterol 1 puff Inhalation QAM   insulin glargine 50 Units Subcutaneous QHS   insulin regular 1-8 Units Subcutaneous Q4H   lactobacillus/streptococcus 1 capsule Oral Daily   methylPREDNISolone 20 mg Intravenous Q6H   pantoprazole 40 mg Oral QAM AC   piperacillin-tazobactam 2.25 g Intravenous Q6H   tamsulosin 0.4 mg Oral QD after dinner         Continuous Infusions:  . calcium GLUConate CRRT Infusion 60 mL/hr (08/26/17 0908)   . citrate dextrose 150 mL (08/26/17 0933)   . phenylephrine Stopped (08/24/17 2150)   . prismasate 0/3.5 1,500 mL/hr (08/26/17 0538)   . prismasate 2/0 1,500 mL/hr (08/26/17 0540)         PRN Meds:acetaminophen **OR** acetaminophen, ALPRAZolam, benzonatate, calcium carbonate, Nursing communication: Adult Hypoglycemia Treatment Algorithm **AND** dextrose **AND** dextrose **AND** glucagon (rDNA), magnesium sulfate, naloxone, ondansetron, potassium chloride, potassium chloride, sodium chloride (PF), sodium phosphate IVPB, sodium phosphate IVPB, zolpidem

## 2017-08-26 NOTE — Plan of Care (Signed)
Problem: Safety  Goal: Patient will be free from injury during hospitalization  Outcome: Progressing   08/26/17 0422   Goal/Interventions addressed this shift   Patient will be free from injury during hospitalization  Assess patient's risk for falls and implement fall prevention plan of care per policy;Provide and maintain safe environment;Use appropriate transfer methods;Ensure appropriate safety devices are available at the bedside;Include patient/ family/ care giver in decisions related to safety;Hourly rounding       Problem: Renal Instability  Goal: Fluid and electrolyte balance are achieved/maintained  Outcome: Progressing   08/26/17 0422   Goal/Interventions addressed this shift   Fluid and electrolyte balance are achieved/maintained  Monitor intake and output every shift;Monitor/assess lab values and report abnormal values;Provide adequate hydration;Assess for confusion/personality changes;Monitor daily weight;Assess and reassess fluid and electrolyte status;Observe for seizure activity and initiate seizure precautions if indicated;Monitor for muscle weakness;Observe for cardiac arrhythmias;Follow fluid restrictions/IV/PO parameters   Pt remains on CRRT with net fluid removal of 50cc/hr. Pt tolerating CRRT with stable blood pressures. No signs of distress noted. CBC/Electrolytes/coags followed.

## 2017-08-27 LAB — CBC
Absolute NRBC: 0.02 10*3/uL — ABNORMAL HIGH
Hematocrit: 37.9 % — ABNORMAL LOW (ref 42.0–52.0)
Hgb: 12.4 g/dL — ABNORMAL LOW (ref 13.0–17.0)
MCH: 29.9 pg (ref 28.0–32.0)
MCHC: 32.7 g/dL (ref 32.0–36.0)
MCV: 91.3 fL (ref 80.0–100.0)
MPV: 12.6 fL — ABNORMAL HIGH (ref 9.4–12.3)
Nucleated RBC: 0.2 /100 WBC (ref 0.0–1.0)
Platelets: 137 10*3/uL — ABNORMAL LOW (ref 140–400)
RBC: 4.15 10*6/uL — ABNORMAL LOW (ref 4.70–6.00)
RDW: 15 % (ref 12–15)
WBC: 11.56 10*3/uL — ABNORMAL HIGH (ref 3.50–10.80)

## 2017-08-27 LAB — BASIC METABOLIC PANEL
Anion Gap: 5 (ref 5.0–15.0)
BUN: 25.7 mg/dL (ref 9.0–28.0)
CO2: 29 mEq/L (ref 22–29)
Calcium: 8.9 mg/dL (ref 7.9–10.2)
Chloride: 103 mEq/L (ref 100–111)
Creatinine: 1.4 mg/dL — ABNORMAL HIGH (ref 0.7–1.3)
Glucose: 257 mg/dL — ABNORMAL HIGH (ref 70–100)
Potassium: 4.7 mEq/L (ref 3.5–5.1)
Sodium: 137 mEq/L (ref 136–145)

## 2017-08-27 LAB — GLUCOSE WHOLE BLOOD - POCT
Whole Blood Glucose POCT: 202 mg/dL — ABNORMAL HIGH (ref 70–100)
Whole Blood Glucose POCT: 269 mg/dL — ABNORMAL HIGH (ref 70–100)
Whole Blood Glucose POCT: 338 mg/dL — ABNORMAL HIGH (ref 70–100)
Whole Blood Glucose POCT: 347 mg/dL — ABNORMAL HIGH (ref 70–100)

## 2017-08-27 LAB — GFR: EGFR: >60

## 2017-08-27 LAB — MAGNESIUM: Magnesium: 2.1 mg/dL (ref 1.6–2.6)

## 2017-08-27 MED ORDER — ALBUTEROL-IPRATROPIUM 2.5-0.5 (3) MG/3ML IN SOLN
3.0000 mL | Freq: Four times a day (QID) | RESPIRATORY_TRACT | Status: DC
Start: 2017-08-27 — End: 2017-08-30
  Administered 2017-08-27 – 2017-08-30 (×9): 3 mL via RESPIRATORY_TRACT
  Filled 2017-08-27 (×11): qty 3

## 2017-08-27 MED ORDER — CARVEDILOL 3.125 MG PO TABS
6.2500 mg | ORAL_TABLET | Freq: Two times a day (BID) | ORAL | Status: DC
Start: 2017-08-27 — End: 2017-08-30
  Administered 2017-08-27 – 2017-08-30 (×6): 6.25 mg via ORAL
  Filled 2017-08-27 (×7): qty 2

## 2017-08-27 NOTE — Consults (Signed)
PULMONARY CONSULTATION                              "[facility]"    Date Time: 08/27/17 5:41 PM  Patient Name: [name] [name]  Attending Physician: Dorthula Nettles, MD  Reason for Consultation: Presumed pneumonia    Assessment:   -Status post hospitalization for suspected pneumonia, now improved.  Patient had elevated pro-calcitonin level, but no leukocytosis, bandemia or fever.  Patient had elevated BNP, bilateral moderate pleural effusion which is likely due to congestive heart failure exacerbation.  -Acute renal failure, required CRRT for 1 day and has been off dialysis.  Renal failure, improved.  -Severe metabolic acidosis likely due to combination of renal failure and possible sepsis, now improved.  -History of asthma.  (Patient does not have COPD), also history of bilateral mild bronchiectasis.  -Severe OSA, was expected to have CPAP titration study in the upcoming weeks.  -History of previous pneumonia.  -Urinary retention, Foley catheter was reinserted.  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  -Coronary artery disease with previous MI.  -Type 2 diabetes.  -Mild pulmonary hypertension    Plan:   Continue current low-dose tapering IV steroids, nebulized albuterol/ipratropium bromide.  -Continue Breo.  -Continue Zosyn for now for suspected pneumonia for total of 5 days.  -Monitor renal function.  -Incentive spirometry.  -Acapella valve.  -Ambulate as tolerated.  -Recommend discontinue central line and dialysis catheter prior to the transfer to the floor.  -Management of heart failure per cardiology team.  Patient remains on diuretic therapy, beta blocker and followed by cardiology.  -We will follow.  Thank you for this kind consultation.    History of Present Illness:   [name] [name] is a 71 y.o. male who presents to the hospital with Few days history of increased dyspnea, admitted on August 22, 2017 which shortness of breath for 4 days  without any fever, chills or chest pain.  Patient was noted to have bilateral moderate pleural effusion and basilar atelectasis, elevated lactic acid, calcitonin level was mildly elevated at 0.12, had no leukocytosis or bandemia.  BNP was 1800.  Initially placed on combination of azithromycin and Rocephin for 2 days followed by switch to Zosyn.  Patient developed presumed contrast-induced nephropathy and required CRRT.  Patient has received tapering IV steroids, currently on 20 mg every 6 hours and receives DuoNeb.  Reports improvement in symptoms and denies any wheezing.  Patient denies any recent sick contact, traveling, or receiving any antibiotics in the past 3 months.  Denies any noncompliance with the medications.  Had no wheezing.  Prior to the admission, but reports increased lower extremity edema and some orthopnea.    Patient is known to our group and follows with Dr. Romie Minus in our office for management of asthma, chronic cough, mild bronchiectasis, and severe OSA.  Patient has had history of previous pneumonia that required hospitalization.  He is known to have nonischemic cardiomyopathy, although has history of coronary artery disease and previous MI.  LVEF has been 20 percent and patient followed by [place] [facility].  He had severe OSA and was expected to have CPAP titration study later this month.  Denies any prior knowledge of chronic kidney disease.  Patient is a high school history Runner, 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video, lifetime nonsmoker and originally from [place], has been living in [place] for many years.        Past Medical History:  Past Medical History:   Diagnosis Date   . AICD (automatic cardioverter/defibrillator) present 2014   . CAD (coronary artery disease)    . Cardiac defibrillator in situ    . CHF (congestive heart failure) 08/12/2014   . Diabetes mellitus    . Heart attack 2011    and 2014   . Hyperlipemia    . Hypertension    . Ischemic cardiomyopathy     20%   . Pneumonia 09/2016       Past Surgical History:      Past Surgical History:   Procedure Laterality Date   . CARDIAC CATHETERIZATION     . CARDIAC DEFIBRILLATOR PLACEMENT     . CARDIAC PACEMAKER PLACEMENT     . CIRCUMCISION     . CORONARY STENT PLACEMENT  2011   . duodenal ulcer  1973   . EGD N/A 08/15/2014    Procedure: EGD;  Surgeon: Colon Branch, MD;  Location: [facility];  Service: Gastroenterology;  Laterality: N/A;  egd w/ bx   . HERNIA REPAIR      [facility]   . orthopedic surgery      R foot corrective   . TONSILLECTOMY AND ADENOIDECTOMY  1954       Family History:     Family History   Problem Relation Age of Onset   . Breast cancer Mother    . Heart attack Mother 66   . Hypertension Mother    . Diabetes Mother    . Diabetes Brother    . Heart attack Father    . Hypertension Father        Social History:     Social History     Social History   . Marital status: Married     Spouse name: Lajoyce Corners   . Number of children: 0   . Years of education: N/A     Occupational History   . 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 Smoking status: Never Smoker   . Smokeless tobacco: Never Used   . Alcohol use No   . Drug use: No   . Sexual activity: Yes     Partners: Female     Other Topics Concern   . Not on file     Social History Narrative   . No narrative on file       Allergies:   No Known Allergies    Medications:     Facility-Administered Medications Prior to Admission   Medication   . levalbuterol (XOPENEX) nebulizer solution 1.25 mg     Prescriptions Prior to Admission   Medication Sig   . amitriptyline (ELAVIL) 25 MG tablet amitriptyline 25 mg tablet   Take 1 tablet every day by oral route.   Marland Kitchen amlodipine-valsartan (EXFORGE) 10-320 MG per tablet amlodipine 10 mg-valsartan 320 mg tablet   Take 1 tablet every day by oral route.   Marland Kitchen aspirin 81 MG chewable tablet Chew 1 tablet (81 mg total) by mouth daily.   . carvedilol (COREG) 25 MG tablet TAKE 1 TABLET BY MOUTH TWICE DAILY WITH MEALS.   Marland Kitchen esomeprazole (NEXIUM) 40 MG capsule    .  fluticasone-salmeterol (ADVAIR DISKUS) 100-50 MCG/DOSE Aerosol Powder, Breath Activtivatede Inhale 1 puff into the lungs 2 (two) times daily.   . hydrocodone-chlorpheniramine (TUSSIONEX) 10-8 MG/5ML suspension Take 5 mLs by mouth 2 (two) times daily.   . insulin glargine (LANTUS SOLOSTAR) 100 UNIT/ML  injection pen Lantus Solostar U-100 Insulin 100 unit/mL (3 mL) subcutaneous pen   . JANUMET 50-1000 MG tablet TAKE 1 TABLET BY MOUTH 2 (TWO) TIMES DAILY WITH MEALS.   . niacin (NIASPAN) 500 MG CR tablet niacin ER 500 mg tablet,extended release 24 hr   . SYMBICORT 80-4.5 MCG/ACT inhaler    . glucose blood (ONE TOUCH ULTRA TEST) test strip 1 each by Other route 2 (two) times daily.Use as instructed   . zolpidem (AMBIEN) 10 MG tablet Take 10 mg by mouth nightly as needed for Sleep.       Review of Systems:     CONSTITUTIONAL: Appetite is Improving.  Denies any significant fever or chills or recent weight change.Marland Kitchen  EYES: No Visual changes; No Eye pain   ENT: No Hearing difficulties; No Ear pain; denies sinus pain or congestion.   CARDIOVASCULAR: No chest pain; No palpitation; positive for recent lower 17 edema  RESPIRATORY: Minimal nonproductive cough, increased dyspnea on exertion, no wheezing or hemoptysis.  GASTROINTESTINAL: No Abdominal pain; No Nausea/vomiting; No Change in bowel habits   GENITOURINARY: No Dysuria; No Urgency; No Incontinence.  MUSCULOSKELETAL: No Joint pain/swelling; No Musculoskeletal deformities   SKIN: No Rash   NEUROLOGICAL: No Mental status changes; No Motor weakness; No Sensory changes   PSYCHOLOGICAL: No Depression; No Anxiety   ENDOCRINE: No Polyuria/polydipsia; No Heat/Cold intolerance   HEMATOLOGY/LYMPHATIC: No Easy bleeding/bruising; No Swollen nodes   ALLERGY/IMMUNOLOGY: No Allergic reaction      Physical Exam:     Vitals:    08/27/17 1600   BP: 140/85   Pulse: 87   Resp: 15   Temp:    SpO2: 94%     Body mass index is 32.11 kg/m.    GENERAL: No acute distress.On 2 L of nasal cannula  oxygen, able to speak in full sentences.  HEENT: Neck supple, oropharynx clear, No lymphadenopathy.   NECK: Right IJ central line and dialysis catheter in place.  LUNGS: Diminished breath sounds on both bases with no audible crackles or wheezing.  CARDIOVASCULAR: Regular rate without audible murmurs.   ABDOMEN: Bowel sounds present. Soft, non-tender, no organomegaly noted.   EXTREMITIES: no edema, no cyanosis or clubbing noted.   SKIN: No rashes.   NEURO: Unremarkable   MENTAL STATUS: Alert   LYMPHATIC: No enlarged nodes    Intake and Output Summary (Last 24 hours) at Date Time    Intake/Output Summary (Last 24 hours) at 08/27/17 1741  Last data filed at 08/27/17 1400   Gross per 24 hour   Intake             1495 ml   Output             1500 ml   Net               -5 ml       Labs:     Results     Procedure Component Value Units Date/Time    Glucose Whole Blood - POCT [[id number]]  (Abnormal) Collected:  08/27/17 1539     Updated:  08/27/17 1553     POCT - Glucose Whole blood 338 (H) mg/dL     Glucose Whole Blood - POCT [[id number]]  (Abnormal) Collected:  08/27/17 1148     Updated:  08/27/17 1153     POCT - Glucose Whole blood 269 (H) mg/dL     Glucose Whole Blood - POCT [[id number]]  (Abnormal) Collected:  08/27/17 9629  Updated:  08/27/17 0741     POCT - Glucose Whole blood 202 (H) mg/dL     Basic Metabolic Panel [[id number]]  (Abnormal) Collected:  08/27/17 0425    Specimen:  Blood Updated:  08/27/17 0452     Glucose 257 (H) mg/dL      BUN 57.8 mg/dL      Creatinine 1.4 (H) mg/dL      Calcium 8.9 mg/dL      Sodium 469 mEq/L      Potassium 4.7 mEq/L      Chloride 103 mEq/L      CO2 29 mEq/L      Anion Gap 5.0    Magnesium [[id number]] Collected:  08/27/17 0425    Specimen:  Blood Updated:  08/27/17 0452     Magnesium 2.1 mg/dL     GFR [[id number]] Collected:  08/27/17 0425     Updated:  08/27/17 0452     EGFR >60.0    CBC without differential [[id number]]  (Abnormal) Collected:  08/27/17 0425    Specimen:  Blood from  Blood Updated:  08/27/17 0436     WBC 11.56 (H) x10 3/uL      Hgb 12.4 (L) g/dL      Hematocrit 74.2 (L) %      Platelets 137 (L) x10 3/uL      RBC 4.15 (L) x10 6/uL      MCV 91.3 fL      MCH 29.9 pg      MCHC 32.7 g/dL      RDW 15 %      MPV 12.6 (H) fL      Nucleated RBC 0.2 /100 WBC      Absolute NRBC 0.02 (H) x10 3/uL     Blood Culture Aerobic/Anaerobic #1 [[id number]] Collected:  08/22/17 1433    Specimen:  Arm from Blood, Intravenous Line Updated:  08/27/17 0021    Narrative:       ORDER#: [id number]                                    ORDERED BY: MEHTA, SAMEER  SOURCE: Blood, Intravenous Line "right hand"         COLLECTED:  08/22/17 14:33  ANTIBIOTICS AT COLL.:                                RECEIVED :  08/22/17 23:25  Culture Blood Aerobic and Anaerobic        PRELIM      08/27/17 00:21  08/24/17   No Growth after 1 day/s of incubation.  08/25/17   No Growth after 2 day/s of incubation.  08/26/17   No Growth after 3 day/s of incubation.  08/27/17   No Growth after 4 day/s of incubation.      Glucose Whole Blood - POCT [[id number]]  (Abnormal) Collected:  08/26/17 2201     Updated:  08/26/17 2203     POCT - Glucose Whole blood 327 (H) mg/dL     Glucose Whole Blood - POCT [[id number]]  (Abnormal) Collected:  08/26/17 2001     Updated:  08/26/17 2008     POCT - Glucose Whole blood 288 (H) mg/dL     Culture Blood Aerobic and Anaerobic [[id number]] Collected:  08/24/17 1211    Specimen:  Blood, Intravenous Line Updated:  08/26/17 1921  Narrative:       Indications:->Pneumonia  ORDER#: [id number]                                    ORDERED BY: Eddie North  SOURCE: Blood, Intravenous Line c line               COLLECTED:  08/24/17 12:11  ANTIBIOTICS AT COLL.:                                RECEIVED :  08/24/17 19:19  Culture Blood Aerobic and Anaerobic        PRELIM      08/26/17 19:21  08/25/17   No Growth after 1 day/s of incubation.  08/26/17   No Growth after 2 day/s of incubation.            Rads:   Radiological  Procedure reviewed.   Radiology Results (24 Hour)     ** No results found for the last 24 hours. **          Signed by: Tyna Jaksch, MD 08/27/2017 5:41 PM

## 2017-08-27 NOTE — Plan of Care (Signed)
Problem: Safety  Goal: Patient will be free from injury during hospitalization  Outcome: Progressing   08/27/17 0511   Goal/Interventions addressed this shift   Patient will be free from injury during hospitalization  Assess patient's risk for falls and implement fall prevention plan of care per policy;Provide and maintain safe environment;Use appropriate transfer methods;Ensure appropriate safety devices are available at the bedside;Include patient/ family/ care giver in decisions related to safety;Hourly rounding       Comments: Pt being transferred to room 217A.  Pt report called to new RN.  All questions answered.  Pt and wife updated on transfer.  Belongings packed.  Tele monitor box and transport requested.  Will monitor pt until he's transferred off unit.

## 2017-08-27 NOTE — Plan of Care (Signed)
Problem: Safety  Goal: Patient will be free from injury during hospitalization  Outcome: Progressing   08/27/17 0511   Goal/Interventions addressed this shift   Patient will be free from injury during hospitalization  Assess patient's risk for falls and implement fall prevention plan of care per policy;Provide and maintain safe environment;Use appropriate transfer methods;Ensure appropriate safety devices are available at the bedside;Include patient/ family/ care giver in decisions related to safety;Hourly rounding       Problem: Hemodynamic Status: Cardiac  Goal: Stable vital signs and fluid balance  Outcome: Progressing   08/27/17 0511   Goal/Interventions addressed this shift   Stable vital signs and fluid balance Monitor/assess vital signs and telemetry per unit protocol;Weigh on admission and record weight daily;Assess signs and symptoms associated with cardiac rhythm changes;Monitor intake/output per unit protocol and/or LIP order;Monitor lab values;Monitor for leg swelling/edema and report to LIP if abnormal

## 2017-08-27 NOTE — Progress Notes (Signed)
PCCM Progress Note                                              I have reviewed the flowsheet and previous day's notes. Events, vitals, medications and notes from last 24 hours reviewed. Care plan discussed with staff.    Subjective:  08/27/2017   Pt says he feels better. His dyspnea is improving. No chest pain.     Impression and Plan:  Problem List:   Patient Active Problem List   Diagnosis   . Chest pain   . Diabetes mellitus   . Heart attack   . CAD (coronary artery disease)   . Non-ischemic cardiomyopathy   . Acute exacerbation of CHF (congestive heart failure)   . PUD (peptic ulcer disease)   . Elevated LFTs   . Acute dyspnea   . Elevated d-dimer   . CHF (congestive heart failure)   . Headaches due to old head injury   . Essential hypertension   . Pneumonia due to infectious organism, unspecified laterality, unspecified part of lung   . Syncope and collapse   . AICD (automatic cardioverter/defibrillator) present   . Syncope, unspecified syncope type   . Pneumonia due to infectious organism   . Dyspnea, unspecified type   . Cardiomyopathy   . Cough   . Sepsis   . AKI (acute kidney injury)   . Sepsis, due to unspecified organism     AKI - Pt had CRRT X 1. His UOP has improved. He is on lasix. Cr is improving. Defer to nephrology  Acute resp failure with hypoxia - Improving. Titrate FiO2 to keep Sat >90%  Ac exac of Asthma/RAD - Cont solumedrol, duonebs, Breo.   Poss RLL Pneumonia - Pt is on Zosyn, WBC count is coming down. 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 F/u with cardiology  Hx of HTN - Coreg  Hx of DM - Lantus and ISS  Dyslipedemia - Cont Lipitor  Hx of CAD s/p PCI and stent - Cont home meds, f/u with cardiology  Hx of GERD - PPI  Poss OSA - Rec outpt sleep study  DVT proph - Lovenox sq    OTHER:  Glycemic Control. Glucose stabilizer per ICU protocol when on insulin drip. Maintain blood glucose 140-180.   Replace electrolytes per ICU electrolyte  replacement protocol  HOB >=30 degree elevation all the time. Aggressive pulmonary toileting. Incentive spirometry when appropriate. Aspiration precautions.   Sepsis bundle and protocol followed. Deescalate antibiotic when appropriate.   Central Line bundle followed, remove when not needed. Large bore IV line or CVP when appropriate.   Foley bundle followed, remove foley catheter when not critically ill.  PT/OT eval and treat. OOB/IS when appropriate.     Quality Care: Stress ulcer prophylaxis, DVT prophylaxis, HOB elevated, Infection control all reviewed and addressed.  Events and notes from last 24 hours reviewed. Care plan discussed with nursing.   D/w patient above medical problems and answered all questions to his satisfaction.     CC TIME: >35 min     Medication Reviewed:    No Known Allergies   Past Medical History:   Diagnosis Date   . AICD (automatic cardioverter/defibrillator) present 2014   . CAD (coronary artery disease)    .  Cardiac defibrillator in situ    . CHF (congestive heart failure) 08/12/2014   . Diabetes mellitus    . Heart attack 2011    and 2014   . Hyperlipemia    . Hypertension    . Ischemic cardiomyopathy     20%   . Pneumonia 09/2016      Past Surgical History:   Procedure Laterality Date   . CARDIAC CATHETERIZATION     . CARDIAC DEFIBRILLATOR PLACEMENT     . CARDIAC PACEMAKER PLACEMENT     . CIRCUMCISION     . CORONARY STENT PLACEMENT  2011   . duodenal ulcer  1973   . EGD N/A 08/15/2014    Procedure: EGD;  Surgeon: Colon Branch, MD;  Location: [facility];  Service: Gastroenterology;  Laterality: N/A;  egd w/ bx   . HERNIA REPAIR      [facility]   . orthopedic surgery      R foot corrective   . TONSILLECTOMY AND ADENOIDECTOMY  1954      Social History   Substance Use Topics   . Smoking status: Never Smoker   . Smokeless tobacco: Never Used   . Alcohol use No      Family History   Problem Relation Age of Onset   . Breast cancer Mother    . Heart attack Mother 62   . Hypertension  Mother    . Diabetes Mother    . Diabetes Brother    . Heart attack Father    . Hypertension Father       Prior to Admission medications    Medication Sig Start Date End Date Taking? Authorizing Provider   amitriptyline (ELAVIL) 25 MG tablet amitriptyline 25 mg tablet   Take 1 tablet every day by oral route. 06/10/17  Yes [provider]   amlodipine-valsartan (EXFORGE) 10-320 MG per tablet amlodipine 10 mg-valsartan 320 mg tablet   Take 1 tablet every day by oral route. 07/15/17  Yes Zorita Pang, MD   aspirin 81 MG chewable tablet Chew 1 tablet (81 mg total) by mouth daily. 12/30/13  Yes McCoy, Dorice Lamas, NP   carvedilol (COREG) 25 MG tablet TAKE 1 TABLET BY MOUTH TWICE DAILY WITH MEALS. 06/08/17  Yes Zorita Pang, MD   esomeprazole (NEXIUM) 40 MG capsule  06/14/17  Yes [provider]   fluticasone-salmeterol (ADVAIR DISKUS) 100-50 MCG/DOSE Aerosol Powder, Breath Activtivatede Inhale 1 puff into the lungs 2 (two) times daily. 02/10/17  Yes Zorita Pang, MD   hydrocodone-chlorpheniramine (TUSSIONEX) 10-8 MG/5ML suspension Take 5 mLs by mouth 2 (two) times daily. 07/12/17  Yes Zorita Pang, MD   insulin glargine (LANTUS SOLOSTAR) 100 UNIT/ML injection pen Lantus Solostar U-100 Insulin 100 unit/mL (3 mL) subcutaneous pen 06/10/17  Yes [provider]   JANUMET 50-1000 MG tablet TAKE 1 TABLET BY MOUTH 2 (TWO) TIMES DAILY WITH MEALS. 11/22/16  Yes Zorita Pang, MD   niacin (NIASPAN) 500 MG CR tablet niacin ER 500 mg tablet,extended release 24 hr   Yes [provider]   SYMBICORT 80-4.5 MCG/ACT inhaler  07/02/17  Yes [provider]   glucose blood (ONE TOUCH ULTRA TEST) test strip 1 each by Other route 2 (two) times daily.Use as instructed 08/15/16   Zorita Pang, MD   zolpidem (AMBIEN) 10 MG tablet Take 10 mg by mouth nightly as needed for Sleep.    [provider]     Current Facility-Administered Medications   Medication Dose Route  Frequency   .  amitriptyline  25 mg Oral QHS   . atorvastatin  40 mg Oral QHS   . carvedilol  6.25 mg Oral Q12H SCH   . docusate sodium  100 mg Oral BID   . fluticasone furoate-vilanterol  1 puff Inhalation QAM   . insulin glargine  50 Units Subcutaneous QHS   . insulin lispro  1-4 Units Subcutaneous QHS   . insulin lispro  1-8 Units Subcutaneous TID AC   . lactobacillus/streptococcus  1 capsule Oral Daily   . methylPREDNISolone  20 mg Intravenous Q6H   . pantoprazole  40 mg Oral QAM AC   . piperacillin-tazobactam  2.25 g Intravenous Q6H   . tamsulosin  0.4 mg Oral QD after dinner     Central Venous Catheter:  CVC Triple Lumen 08/24/17 Right Internal jugular Non-Tunneled (Active)   Line necessity reviewed? Dialysis 08/27/2017 10:00 AM   Site Assessment Clean;Dry;Intact 08/27/2017 10:00 AM   Proximal Lumen Status Capped;Flushed 08/27/2017 10:00 AM   Medial Lumen Status Capped;Flushed 08/27/2017 10:00 AM   Distal Lumen Status Capped;Flushed 08/27/2017 10:00 AM   Dressing Type Transparent 08/27/2017 10:00 AM   Dressing Status Clean;Dry;Intact 08/27/2017 10:00 AM   Biopatch Used? ([facility] Only) Yes 08/27/2017 10:00 AM   Curos Caps Used? ([facility] Only) Yes 08/27/2017 10:00 AM   End Caps Free From Blood Yes 08/27/2017 10:00 AM   Line Used For Blood Draw Yes 08/25/2017  2:00 PM   Dressing Change Due 08/31/17 08/27/2017  8:00 AM   Cap Change Due 08/28/17 08/27/2017  8:00 AM       Permacath/Temporary Catheter 08/24/17 Temporary Catheter Right (Active)   Line necessity reviewed? Dialysis 08/27/2017 10:00 AM   Dressing Status and Intervention Dressing Intact 08/27/2017 10:00 AM   [place] Caps on Catheter Yes 08/27/2017 10:00 AM   End Caps Free From Blood Yes 08/27/2017 10:00 AM   Line Care Connections checked and tightened 08/27/2017 10:00 AM   Dressing Type Transparent 08/27/2017 10:00 AM   Dressing Status Clean;Dry;Intact 08/27/2017 10:00 AM   Biopatch Used? ([facility] Only) Yes 08/27/2017 10:00 AM   Curos Caps Used? ([facility] Only) Yes 08/27/2017 10:00 AM   Line Used For Blood Draw  No 08/27/2017 10:00 AM   Dressing Change Due 08/31/17 08/27/2017  8:00 AM     Drain(s):  Urethral Catheter Non-latex 14 Fr. (Active)   Catheter necessity reviewed? Yes 08/27/2017 10:00 AM   Site Assessment Clean;Intact 08/27/2017 10:00 AM   Pericare (With foley) Yes 08/26/2017  8:00 PM   Urine Catheter Care Yes 08/26/2017  8:00 PM   Collection Container Standard drainage bag 08/27/2017 10:00 AM   Securement Method Stat lock 08/27/2017 10:00 AM   Reason for Continuing Urinary Catheterization past POD 1 Accurate I/O for ICU patient who is hemodynamically unstable 08/27/2017 10:00 AM   Positioned catheter tubing for unobstructed urine flow: Yes 08/27/2017 10:00 AM   Output (mL) 300 mL 08/27/2017  6:00 AM     Objective:  Vital Signs:    BP (!) 135/94   Pulse 86   Temp 97 F (36.1 C) (Temporal Artery)   Resp 20   Ht 1.829 m (6')   Wt 107.4 kg (236 lb 12.4 oz)   SpO2 94%   BMI 32.11 kg/m       Prehospital Care  O2 Device: None (Room air)  FiO2: 40 %  O2 Flow Rate (L/min): 2 L/min  Temp (24hrs), Avg:97.9 F (36.6 C), Min:97 F (36.1 C), Max:98.6 F (  37 C)      Intake/Output:   Last shift:      No intake/output data recorded.    Last 3 shifts: 11/07 1901 - 11/09 0700  In: 2364.7 [P.O.:420; I.V.:1944.7]  Out: 4121 [Urine:2526]      Intake/Output Summary (Last 24 hours) at 08/27/17 1040  Last data filed at 08/27/17 0600   Gross per 24 hour   Intake              597 ml   Output             1750 ml   Net            -1153 ml     Physical Exam:     General/Neurology: Alert, Awake,   Head:   Normocephalic, without obvious abnormality  Eye:   PERRL, EOM intact, no scleral icterus, no pallor  Oral:   Mucus membranes moist  Neck:   Supple  Lung:   Few scattered wheezes present b/l  Heart:   Regular rate & rhythm. S1 S2 present.   Abdomen/GU: Soft, non tender, BS +nt  Extremities:  Min pedal edema  Skin:   Dry, intact    Data:      Recent Results (from the past 24 hour(s))   Glucose Whole Blood - POCT    Collection Time: 08/26/17  11:21 AM   Result Value Ref Range    POCT - Glucose Whole blood 181 (H) 70 - 100 mg/dL   Basic Metabolic Panel    Collection Time: 08/26/17  3:52 PM   Result Value Ref Range    Glucose 237 (H) 70 - 100 mg/dL    BUN 16.1 9.0 - 09.6 mg/dL    Creatinine 1.5 (H) 0.7 - 1.3 mg/dL    Calcium 9.4 7.9 - 04.5 mg/dL    Sodium 409 (L) 811 - 145 mEq/L    Potassium 4.6 3.5 - 5.1 mEq/L    Chloride 101 100 - 111 mEq/L    CO2 28 22 - 29 mEq/L    Anion Gap 6.0 5.0 - 15.0   Magnesium    Collection Time: 08/26/17  3:52 PM   Result Value Ref Range    Magnesium 1.9 1.6 - 2.6 mg/dL   GFR    Collection Time: 08/26/17  3:52 PM   Result Value Ref Range    EGFR 55.9    Glucose Whole Blood - POCT    Collection Time: 08/26/17  4:39 PM   Result Value Ref Range    POCT - Glucose Whole blood 229 (H) 70 - 100 mg/dL   Glucose Whole Blood - POCT    Collection Time: 08/26/17  8:01 PM   Result Value Ref Range    POCT - Glucose Whole blood 288 (H) 70 - 100 mg/dL   Glucose Whole Blood - POCT    Collection Time: 08/26/17 10:01 PM   Result Value Ref Range    POCT - Glucose Whole blood 327 (H) 70 - 100 mg/dL   CBC without differential    Collection Time: 08/27/17  4:25 AM   Result Value Ref Range    WBC 11.56 (H) 3.50 - 10.80 x10 3/uL    Hgb 12.4 (L) 13.0 - 17.0 g/dL    Hematocrit 91.4 (L) 42.0 - 52.0 %    Platelets 137 (L) 140 - 400 x10 3/uL    RBC 4.15 (L) 4.70 - 6.00 x10 6/uL    MCV 91.3 80.0 - 100.0 fL  MCH 29.9 28.0 - 32.0 pg    MCHC 32.7 32.0 - 36.0 g/dL    RDW 15 12 - 15 %    MPV 12.6 (H) 9.4 - 12.3 fL    Nucleated RBC 0.2 0.0 - 1.0 /100 WBC    Absolute NRBC 0.02 (H) 0 x10 3/uL   Basic Metabolic Panel    Collection Time: 08/27/17  4:25 AM   Result Value Ref Range    Glucose 257 (H) 70 - 100 mg/dL    BUN 16.1 9.0 - 09.6 mg/dL    Creatinine 1.4 (H) 0.7 - 1.3 mg/dL    Calcium 8.9 7.9 - 04.5 mg/dL    Sodium 409 811 - 914 mEq/L    Potassium 4.7 3.5 - 5.1 mEq/L    Chloride 103 100 - 111 mEq/L    CO2 29 22 - 29 mEq/L    Anion Gap 5.0 5.0 - 15.0    Magnesium    Collection Time: 08/27/17  4:25 AM   Result Value Ref Range    Magnesium 2.1 1.6 - 2.6 mg/dL   GFR    Collection Time: 08/27/17  4:25 AM   Result Value Ref Range    EGFR >60.0    Glucose Whole Blood - POCT    Collection Time: 08/27/17  7:38 AM   Result Value Ref Range    POCT - Glucose Whole blood 202 (H) 70 - 100 mg/dL         Chemistry Recent Labs      08/27/17   0425  08/26/17   1552  08/26/17   0812  08/26/17   0142  08/25/17   2001   Glucose  257*  237*  137*  245*  266*   Sodium  137  135*  138  137  135*   Potassium  4.7  4.6  3.5  4.0  4.3   Chloride  103  101  102  101  101   CO2  29  28  30*  23  25   BUN  25.7  22.6  17.9  23.5  26.8   Calcium  8.9  9.4  10.0  9.5  9.7   Magnesium  2.1  1.9  1.7  1.7  1.7   Phosphorus   --    --   2.9  3.0  3.3   Albumin   --    --   2.8*  2.8*  2.9*       CBC w/Diff Recent Labs      08/27/17   0425  08/26/17   0145  08/25/17   1420   WBC  11.56*  12.40*  14.92*   RBC  4.15*  4.14*  4.50*   Hgb  12.4*  12.7*  13.7   Hematocrit  37.9*  38.9*  41.9*   Platelets  137*  130*  150          Recent Labs  Lab 08/24/17  1036 08/22/17  1451   ABG CollectionSite Right Radl " RIGHT WRIST"   Allen's Test Yes Yes   Temperature 98.6 97.2   Arterial pO2 Corrected 74.0* 57.0*   pH, Arterial 7.29* 7.26*   pCO2, Arterial 36.0 38.1   HCO3, Arterial 17.3* 17.2*   Base Excess, Arterial -8.5* -10.0*   O2 Sat, Arterial 92.8* 87.0*   FIO2  --  36.0   O2 Delivery Nasal Cannula Nasal Cannula       Microbiology Results  Procedure Component Value Units Date/Time    Blood Culture Aerobic/Anaerobic #1 [[id number]] Collected:  08/22/17 1433    Specimen:  Arm from Blood, Intravenous Line Updated:  08/27/17 0021    Narrative:       ORDER#: [id number]                                    ORDERED BY: MEHTA, SAMEER  SOURCE: Blood, Intravenous Line "right hand"         COLLECTED:  08/22/17 14:33  ANTIBIOTICS AT COLL.:                                RECEIVED :  08/22/17 23:25  Culture Blood  Aerobic and Anaerobic        PRELIM      08/27/17 00:21  08/24/17   No Growth after 1 day/s of incubation.  08/25/17   No Growth after 2 day/s of incubation.  08/26/17   No Growth after 3 day/s of incubation.  08/27/17   No Growth after 4 day/s of incubation.      Culture Blood Aerobic and Anaerobic [[id number]] Collected:  08/24/17 1211    Specimen:  Blood, Intravenous Line Updated:  08/26/17 1921    Narrative:       Indications:->Pneumonia  ORDER#: [id number]                                    ORDERED BY: Eddie North  SOURCE: Blood, Intravenous Line c line               COLLECTED:  08/24/17 12:11  ANTIBIOTICS AT COLL.:                                RECEIVED :  08/24/17 19:19  Culture Blood Aerobic and Anaerobic        PRELIM      08/26/17 19:21  08/25/17   No Growth after 1 day/s of incubation.  08/26/17   No Growth after 2 day/s of incubation.      Legionella antigen, urine [[id number]] Collected:  08/23/17 2030    Specimen:  Urine from Urine, Clean Catch Updated:  08/24/17 0642    Narrative:       ORDER#: [id number]                                    ORDERED BY: Arbell Wycoff  SOURCE: Urine, Clean Catch                           COLLECTED:  08/23/17 20:30  ANTIBIOTICS AT COLL.:                                RECEIVED :  08/23/17 23:58  Legionella, Rapid Urinary Antigen          FINAL       08/24/17 06:42  08/24/17   Negative for Legionella pneumophila Serogroup 1 Antigen             Limitations of Test:  1. Negative results do not exclude infection with Legionella                pneumophila Serogroup 1.             2. Does not detect other serogroups of L. pneumophila                or other Legionella species.             Test Reference Range: Negative      MRSA culture [[id number]] Collected:  08/22/17 2030    Specimen:  Body Fluid from Nasal Swab Updated:  08/23/17 1735    Narrative:       ORDER#: [id number]                                    ORDERED BY: Ramonita Koenig  SOURCE: Nares                                         COLLECTED:  08/22/17 20:30  ANTIBIOTICS AT COLL.:                                RECEIVED :  08/22/17 23:11  Culture MRSA Surveillance                  FINAL       08/23/17 17:35  08/23/17   Negative for Methicillin Resistant Staph aureus      MRSA culture [[id number]] Collected:  08/22/17 2030    Specimen:  Body Fluid from Throat Updated:  08/23/17 1735    Narrative:       ORDER#: [id number]                                    ORDERED BY: Dawson Albers  SOURCE: Throat                                       COLLECTED:  08/22/17 20:30  ANTIBIOTICS AT COLL.:                                RECEIVED :  08/22/17 23:11  Culture MRSA Surveillance                  FINAL       08/23/17 17:35  08/23/17   Negative for Methicillin Resistant Staph aureus      Rapid influenza A/B antigens [[id number]] Collected:  08/23/17 0352    Specimen:  Nasopharyngeal from Nasal Aspirate Updated:  08/23/17 0420    Narrative:       ORDER#: [id number]                                    ORDERED BY: Ananda Caya  SOURCE: Nasal Aspirate  COLLECTED:  08/23/17 03:52  ANTIBIOTICS AT COLL.:                                RECEIVED :  08/23/17 04:03  Influenza Rapid Antigen A&B                FINAL       08/23/17 04:20  08/23/17   Negative for Influenza A and B             Reference Range: Negative      STREP PNEUMONIA, RAPID URINARY ANTIGEN [[id number]] Collected:  08/23/17 2040    Specimen:  Urine, Clean Catch Updated:  08/24/17 0642    Narrative:       ORDER#: [id number]                                    ORDERED BY: Thang Flett  SOURCE: Urine, Clean Catch                           COLLECTED:  08/23/17 20:40  ANTIBIOTICS AT COLL.:                                RECEIVED :  08/23/17 23:58  S. pneumoniae, Rapid Urinary Antigen       FINAL       08/24/17 06:42  08/24/17   Negative for Streptococcus pneumoniae Urinary Antigen             Note:             This is a presumptive test for the direct  qualitative             detection of bacterial antigen. This test is not intended as             a substitute for a gram stain and bacterial culture. Samples             with extremely low levels of antigen may yield negative             results.             Reference Range: Negative          High complexity decision making was performed during the evaluation of this patient at high risk for decompensation with multiple organ involvement     Above mentioned total time spent on reviewing the case/medical record/data/notes/EMR/patient examination/documentation/coordinating care with nurse/consultants, exclusive of procedures with complex decision making performed and > 50% time spent in face to face evaluation.    Lynann Bologna  08/27/2017

## 2017-08-27 NOTE — Plan of Care (Deleted)
Problem: Safety  Goal: Patient will be free from injury during hospitalization  Outcome: Progressing   08/27/17 0459   Goal/Interventions addressed this shift   Patient will be free from injury during hospitalization  Ensure appropriate safety devices are available at the bedside;Assess patient's risk for falls and implement fall prevention plan of care per policy;Provide and maintain safe environment;Use appropriate transfer methods;Include patient/ family/ care giver in decisions related to safety;Hourly rounding;Assess for patients risk for elopement and implement Elopement Risk Plan per policy       Problem: Pain  Goal: Pain at adequate level as identified by patient  Outcome: Progressing   08/27/17 0459   Goal/Interventions addressed this shift   Pain at adequate level as identified by patient Identify patient comfort function goal;Assess for risk of opioid induced respiratory depression, including snoring/sleep apnea. Alert healthcare team of risk factors identified.;Assess pain on admission, during daily assessment and/or before any "as needed" intervention(s);Reassess pain within 30-60 minutes of any procedure/intervention, per Pain Assessment, Intervention, Reassessment (AIR) Cycle;Evaluate if patient comfort function goal is met;Evaluate patient's satisfaction with pain management progress;Offer non-pharmacological pain management interventions   Pt given PRN dialudid 1mg  which has been effective.     Problem: Hemodynamic Status: Cardiac  Goal: Stable vital signs and fluid balance  Outcome: Progressing   08/27/17 0459   Goal/Interventions addressed this shift   Stable vital signs and fluid balance Monitor/assess vital signs and telemetry per unit protocol;Weigh on admission and record weight daily;Assess signs and symptoms associated with cardiac rhythm changes;Monitor intake/output per unit protocol and/or LIP order;Monitor lab values;Monitor for leg swelling/edema and report to LIP if abnormal   Pt given  PRN BP medication for SBP >150. Pt maintained on strict bed rest.

## 2017-08-27 NOTE — Plan of Care (Signed)
Problem: Renal Instability  Goal: Fluid and electrolyte balance are achieved/maintained  Outcome: Progressing   08/27/17 0824   Goal/Interventions addressed this shift   Fluid and electrolyte balance are achieved/maintained  Monitor intake and output every shift;Monitor/assess lab values and report abnormal values;Provide adequate hydration;Monitor daily weight;Assess for confusion/personality changes;Assess and reassess fluid and electrolyte status;Observe for seizure activity and initiate seizure precautions if indicated;Observe for cardiac arrhythmias;Monitor for muscle weakness;Follow fluid restrictions/IV/PO parameters

## 2017-08-27 NOTE — PT Eval Note (Signed)
[facility]  [address]  [place], [place]. [address]    Department of Rehabilitation  [phone]    Physical Therapy Evaluation    Patient: [name] [name]    MRN#: [id number]     IC03/IC03-A    Time of treatment: Time Calculation  PT Received On: 08/27/17  Start Time: 1041  Stop Time: 1105  Time Calculation (min): 24 min    PT Visit Number: 1    Consult received for [name] [name] for PT Evaluation and Treatment.  Patient's medical condition is appropriate for Physical therapy intervention at this time.      Assessment:   [name] [name] is a 71 y.o. male admitted 08/22/2017.  Pt's functional mobility is impacted by:  decreased activity tolerance, decreased balance and decreased strength.  There are a few comorbidities or other factors that affect plan of care and require modification of task including: has stairs to manage.  Standardized tests and exams incorporated into evaluation include AMPAC mobility.  Pt demonstrates a stable clinical presentation due to no adverse response to activity.   Pt would continue to benefit from PT to address these deficits and increase functional independence.     Complexity Level Hx and Co  morbidites Examination Clinical Decision Making Clinical Presentation   Low no impact 1-2 elements Limited options Stable   Moderate   1-2 factors 3 or more   Several options Evolving, plan may alter   High 3 or more 4 or more Multiple options Unstable, unpredictable       Impairments: Assessment: Decreased LE strength;Decreased endurance/activity tolerance;Decreased functional mobility;Decreased balance.     Therapy Diagnosis: generalized weakness, decreased functional mobility  and decreased balance due to current illness, prolonged hospitalization. Without therapy interventions, patient is at risk for falls, dependence on caregivers for mobility and failure to return to PLOF.    Rehabilitation Potential: Prognosis: Good;With continued PT status post acute  discharge      Plan:    Treatment/Interventions: Gait training;Neuromuscular re-education;Functional transfer training;LE strengthening/ROM;Endurance training;Bed mobility PT Frequency: 4-5x/wk    Risks/Benefits/POC Discussed with Pt/Family: With patient          Goals:   Goals  Goal Formulation: With patient  Time for Goal Acheivement: By time of discharge  Goals: Select goal  Pt Will Go Supine To Sit: modified independent;to maximize functional mobility and independence;5 visits  Pt Will Perform Sit to Stand: modified independent;to maximize functional mobility and independence;5 visits  Pt Will Demo Standing Balance: 4/5 indep, moves/returns center of grav 1-2"/1 plane;to maximize functional mobility and independence;5 visits  Pt Will Transfer Bed/Chair: with supervision;to maximize functional mobility and independence;5 visits  Pt Will Ambulate: 101-150 feet;with minimal assist;to maximize functional mobility and independence;5 visits      Discharge Recommendations:   Based on today's session patient's discharge recommendation is the following: Acute Rehab     If Discharge Recommendation: Acute Rehab is not available, then the patient will need home health services, increase supervision , assistance with mobility, assistance with ADL's and RW.         Precautions and Contraindications: Fall risk       Medical Diagnosis: Acute respiratory insufficiency [R06.89]  Sepsis, due to unspecified organism [A41.9]  Pneumonia of right lower lobe due to infectious organism [J18.1]    History of Present Illness: [name] [name] is a 71 y.o. male admitted on 08/22/2017   Per Dr. Pearla Dubonnet course  11/04 IMC-- Sepsis/ PNA (wbc=10.27, lactic=3?4,  cxr= Rt  basilar airspace opacities)  , CHF (bnp=1828)  w/ resp distress  ~ Lasix , IV bolus given , AKI (bun=25, cr=1.8)   . 11/4 CT abd & pelvis: Small amt  scattered ascites w/  no localized inflammation. No bowel obstruction. Rt heart disease w/  reflux of contrast into  the hepatic veins. Numerous bilateral renal cysts.  11/6  ICU  worsening Renal Failure (bun=60, cr=4.1) , Elevated LFT's (ast= 181alt=-214)   Repeat BC  x 1 set.  CRRT started   11/8 CRRT stopped       Patient Active Problem List   Diagnosis   . Chest pain   . Diabetes mellitus   . Heart attack   . CAD (coronary artery disease)   . Non-ischemic cardiomyopathy   . Acute exacerbation of CHF (congestive heart failure)   . PUD (peptic ulcer disease)   . Elevated LFTs   . Acute dyspnea   . Elevated d-dimer   . CHF (congestive heart failure)   . Headaches due to old head injury   . Essential hypertension   . Pneumonia due to infectious organism, unspecified laterality, unspecified part of lung   . Syncope and collapse   . AICD (automatic cardioverter/defibrillator) present   . Syncope, unspecified syncope type   . Pneumonia due to infectious organism   . Dyspnea, unspecified type   . Cardiomyopathy   . Cough   . Sepsis   . AKI (acute kidney injury)   . Sepsis, due to unspecified organism        Past Medical/Surgical History:  Past Medical History:   Diagnosis Date   . AICD (automatic cardioverter/defibrillator) present 2014   . CAD (coronary artery disease)    . Cardiac defibrillator in situ    . CHF (congestive heart failure) 08/12/2014   . Diabetes mellitus    . Heart attack 2011    and 2014   . Hyperlipemia    . Hypertension    . Ischemic cardiomyopathy     20%   . Pneumonia 09/2016      Past Surgical History:   Procedure Laterality Date   . CARDIAC CATHETERIZATION     . CARDIAC DEFIBRILLATOR PLACEMENT     . CARDIAC PACEMAKER PLACEMENT     . CIRCUMCISION     . CORONARY STENT PLACEMENT  2011   . duodenal ulcer  1973   . EGD N/A 08/15/2014    Procedure: EGD;  Surgeon: Colon Branch, MD;  Location: [facility];  Service: Gastroenterology;  Laterality: N/A;  egd w/ bx   . HERNIA REPAIR      [facility]   . orthopedic surgery      R foot corrective   . TONSILLECTOMY AND ADENOIDECTOMY  1954         Social History:  Prior  Level of Function  Prior level of function: Independent with ADLs, Ambulates independently  Baseline Activity Level: Community ambulation  Driving: independent  Cooking: light meal prep  DME Currently at Home: Other (Comment) (none)  Home Living Arrangements  Living Arrangements: Spouse/significant other  Type of Home: House  Home Layout: Multi-level, Bed/bath upstairs  Bathroom Shower/Tub: Event organiser: Paediatric nurse  DME Currently at Home: Other (Comment) (none)      Subjective:    Patient is agreeable to participation in the therapy session. Nursing clears patient for therapy. No c/o pain currently.    Patient Goal  Patient Goal: To return home  Objective:   Observation of Patient/Vital Signs:  Patient is in bed with telemetry, central line, O2 via nasal cannula and indwelling urinary catheter in place.    Cognition/Neuro Status  Arousal/Alertness: Appropriate responses to stimuli  Attention Span: Appears intact  Orientation Level: Oriented to person;Oriented to place;Disoriented to time  Memory: Appears intact  Following Commands: Follows one step commands with increased time  Safety Awareness: minimal verbal instruction  Insights: Decreased awareness of deficits  Behavior: attentive;calm;cooperative    Sensation: intact light touch BLE    Gross ROM  Right Lower Extremity ROM: within functional limits  Left Lower Extremity ROM: within functional limits  Gross Strength  Right Lower Extremity Strength: 4/5  Left Lower Extremity Strength: 4/5       Functional Mobility  Supine to Sit: Minimal Assist;HOB raised  Sit to Stand: Minimal Assist (Posterior LOB upon standing, min-mod A to correct)  Stand to Sit: Minimal Assist  Transfers  Bed to Chair: Minimal Assist (stand stepping, dec foot clearance B, wide BOS)        PMP Activity: Step 5 - Chair     Balance  Balance: needs focused assessment  Sitting - Static: Good  Sitting - Dynamic: Fair  Standing - Static: Fair  Standing - Dynamic:  Fair    Participation and Endurance  Participation Effort: good  Endurance: Tolerates 10 - 20 min exercise with multiple rests. Reported dizziness with initial EOB sitting, improved with static sitting and LE movement. No SOB noted.    AM-PACT Inpatient Short Forms  Inpatient AM-PACT Performed? (PT): Basic Mobility Inpatient Short Form  AM-PACT "6 Clicks" Basic Mobility Inpatient Short Form  Turning Over in Bed: A little  Sitting Down On/Standing From Armchair: A little  Lying on Back to Sitting on Side of Bed: A little  Assist Moving to/from Bed to Chair: A little  Assist to Walk in Hospital Room: A little  Assist to Climb 3-5 Steps with Railing: A little  PT Basic Mobility Raw Score: 18  CMS 0-100% Score: 46.58%    Treatment Activities: Educated in and performed AP, LAQ and seated marching Bx10. Encouraged to perform LE therex throughout the day to decrease effects of immobility. Encouraged to sit OOB as tolerated for pulmonary hygiene. Instructed to use RW for ambulation at this time due to decreased balance with transfer OOB to chair. Instructed not to get up without assistance for safety. Pt verbalized understanding for all education provided.    Educated the patient to role of physical therapy, plan of care, goals of therapy and HEP, safety with mobility and ADLs, home safety.    At end of session pt reclined in chair, call bell and items in reach. RN aware. Chair alarm on.      Delfin Edis, PT, DPT  Pager #: [phone]

## 2017-08-27 NOTE — Progress Notes (Signed)
[facility]  PROGRESS NOTE  [facility]    Date Time: 08/27/17 8:24 AM  Patient Name: [name] [name], [name] [name]       Patient Active Problem List   Diagnosis   . Chest pain   . Diabetes mellitus   . Heart attack   . CAD (coronary artery disease)   . Non-ischemic cardiomyopathy   . Acute exacerbation of CHF (congestive heart failure)   . PUD (peptic ulcer disease)   . Elevated LFTs   . Acute dyspnea   . Elevated d-dimer   . CHF (congestive heart failure)   . Headaches due to old head injury   . Essential hypertension   . Pneumonia due to infectious organism, unspecified laterality, unspecified part of lung   . Syncope and collapse   . AICD (automatic cardioverter/defibrillator) present   . Syncope, unspecified syncope type   . Pneumonia due to infectious organism   . Dyspnea, unspecified type   . Cardiomyopathy   . Cough   . Sepsis   . AKI (acute kidney injury)   . Sepsis, due to unspecified organism       Assessment:   Nonsustained vt yesterday      Admitted with acute respiratory failure presumed asthma flare and possible PNA. No leukocytosis, procalcitonin negative, afebrile   Acute systolic chf, elevated BNP (1800)   ? 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  ? 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 Normal myocardial perfusion by nuclear stress testing 06/2016.    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   History of syncope in the past that has never been proven to represent ventricular arrhythmias by device interrogations. 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   Increase coreg       Medications:       Scheduled Meds: PRN Meds:      amitriptyline 25 mg Oral QHS   atorvastatin 40 mg Oral QHS   carvedilol 3.125 mg Oral Q12H SCH   docusate sodium 100 mg Oral BID   fluticasone furoate-vilanterol 1 puff Inhalation QAM   insulin glargine 50 Units Subcutaneous QHS   insulin lispro 1-4 Units Subcutaneous QHS   insulin lispro 1-8 Units Subcutaneous TID AC   lactobacillus/streptococcus 1 capsule Oral Daily   methylPREDNISolone 20 mg Intravenous Q6H   pantoprazole 40 mg Oral QAM AC   piperacillin-tazobactam 2.25 g Intravenous Q6H   tamsulosin 0.4 mg Oral QD after dinner       Continuous Infusions:     acetaminophen 650 mg Q4H PRN   Or     acetaminophen 650 mg Q4H PRN   ALPRAZolam 0.25 mg BID PRN   benzonatate 100 mg TID PRN   calcium carbonate 500 mg 4X Daily PRN   dextrose 15 g of glucose PRN   And     dextrose 12.5 g PRN   And     glucagon (rDNA) 1 mg PRN   naloxone 0.2 mg PRN   ondansetron 4 mg Q12H PRN   zolpidem 5 mg QHS PRN             Subjective:  Denies chest pain, SOB or palpitations.      Physical Exam:     Vitals:    08/27/17 0800   BP: 130/90   Pulse: 81   Resp: 13   Temp:    SpO2: 98%     Temp (24hrs), Avg:97.9 F (36.6 C), Min:97 F (36.1 C), Max:98.6 F (37 C)      Telemetry reviewed no changes.     Intake and Output Summary (Last 24 hours) at Date Time    Intake/Output Summary (Last 24 hours) at 08/27/17 0824  Last data filed at 08/27/17 0600   Gross per 24 hour   Intake            795.2 ml   Output             1979 ml   Net          -1183.8 ml       General Appearance:  Breathing comfortable, no acute distress  Head:  normocephalic  Eyes:  EOM's intact  Neck:  No carotid bruit or jugular venous distension, brisk carotid upstroke  Lungs:  Clear to auscultation throughout, no wheezes, rhonchi or rales, good respiratory effort   Chest Wall:  No tenderness or deformity  Heart:  S1, S2 normal, no S3, no S4, no murmur, PMI not displaced, no rub   Abdomen:  Soft, non-tender, positive bowel sounds, no  hepatojugular reflux  Extremities:  No cyanosis, clubbing or edema  Pulses:  Equal radial pulses, 4/4 symmetric  Neurologic:  Alert and oriented x3, mood and affect normal  Musculoskeletal: normal strength and tone    Labs:     Recent Labs  Lab 08/22/17  2030 08/22/17  1932 08/22/17  1155   Troponin I 0.04 0.04 0.05               Recent Labs  Lab 08/26/17  0812  08/24/17  0748   Bilirubin, Total  --   --  0.6   Protein, Total  --   --  6.6   Albumin 2.8* More results in Results Review 3.7   ALT  --   --  214*   AST (SGOT)  --   --  181*   More results in Results Review = values in this interval not displayed.    Recent Labs  Lab 08/27/17  0425   Magnesium 2.1       Recent Labs  Lab 08/26/17  0812   PT 16.9*   PT INR 1.4*   PTT 32       Recent Labs  Lab 08/27/17  0425 08/26/17  0145 08/25/17  1420   WBC 11.56* 12.40* 14.92*   Hgb 12.4* 12.7* 13.7   Hematocrit 37.9* 38.9* 41.9*   Platelets 137* 130* 150       Recent Labs  Lab 08/27/17  0425 08/26/17  1552 08/26/17  0812   Sodium 137 135* 138   Potassium 4.7 4.6 3.5   Chloride 103 101 102   CO2 29 28 30*   BUN 25.7 22.6 17.9   Creatinine 1.4* 1.5* 1.2   EGFR >60.0 55.9 >60.0   Glucose 257* 237* 137*   Calcium 8.9 9.4 10.0           Invalid input(s): FREET4    .  Lab Results   Component Value Date    BNP 1,551.7 (H) 08/24/2017      Estimated Creatinine Clearance: 62.2 mL/min (A) (based on SCr  of 1.4 mg/dL (H)).    Weight Monitoring 07/21/2017 08/22/2017 08/22/2017 08/23/2017 08/25/2017 08/26/2017 08/27/2017   Height - - 182.9 cm - - - -   Height Method - - Stated - - - -   Weight 95.981 kg 100.1 kg 99.2 kg 102.3 kg 104.3 kg 104.1 kg 107.4 kg   Weight Method - Bed Scale Bed Scale Bed Scale Bed Scale Bed Scale Bed Scale   BMI (calculated) - - 29.7 kg/m2 - - - -         Imaging:   Radiological Procedure reviewed.              Signed by: Amedeo Gory, MD        [facility]  NP Spectralink [phone] (8am-5pm)  MD Spectralink [phone] (8am-5pm)  After hours, non urgent consult  line [phone]  After Hours, urgent consults [phone]

## 2017-08-27 NOTE — Progress Notes (Signed)
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    No SOB  No Chest pain  No Abdominal pain  No Nausea/Vomiting  No Diarrhea      Objective:   Vital signs in last 24 hours:  Temp:  [97 F (36.1 C)-98.1 F (36.7 C)] 97.4 F (36.3 C)  Heart Rate:  [80-95] 91  Resp Rate:  [13-25] 22  BP: (118-159)/(72-97) 125/91     General:    In NAD    HEENT:   Conjunctiva pale, Dry mucosa          CVS:  RRR, no rub        Chest:  CTA/Bilaterally  Abdomen:   Soft, nontender           Ext.:   No edema      Intake/Output:   Intake/Output last 24 hours:    Intake/Output Summary (Last 24 hours) at 08/27/17 2118  Last data filed at 08/27/17 1800   Gross per 24 hour   Intake             1895 ml   Output             2100 ml   Net             -205 ml     Intake/Output this shift:  No intake/output data recorded.      Labs:       Recent Labs  Lab 08/27/17  0425 08/26/17  1552 08/26/17  0812 08/26/17  0142 08/25/17  2001 08/25/17  1420 08/25/17  0805   Sodium 137 135* 138 137 135* 137 136    Potassium 4.7 4.6 3.5 4.0 4.3 4.4 4.2   Chloride 103 101 102 101 101 101 101   CO2 29 28 30* 23 25 23 26    BUN 25.7 22.6 17.9 23.5 26.8 30.7* 35.1*   Creatinine 1.4* 1.5* 1.2 1.5* 1.7* 1.9* 2.2*   Glucose 257* 237* 137* 245* 266* 270* 202*   Calcium 8.9 9.4 10.0 9.5 9.7 9.4 9.4   Magnesium 2.1 1.9 1.7 1.7 1.7 1.7 1.8   Phosphorus  --   --  2.9 3.0 3.3 3.6 4.4   EGFR >60.0 55.9 >60.0 55.9 48.4 42.5 35.9  Recent Labs  Lab 08/27/17  0425 08/26/17  0145 08/25/17  1420   WBC 11.56* 12.40* 14.92*   Hgb 12.4* 12.7* 13.7   Hematocrit 37.9* 38.9* 41.9*   Platelets 137* 130* 150                   Medications:   Scheduled Meds:       albuterol-ipratropium 3 mL Nebulization Q6H SCH   amitriptyline 25 mg Oral QHS   atorvastatin 40 mg Oral QHS   carvedilol 6.25 mg Oral Q12H SCH   docusate sodium 100 mg Oral BID   fluticasone furoate-vilanterol 1 puff Inhalation QAM   insulin glargine 50 Units Subcutaneous QHS   insulin lispro 1-4 Units Subcutaneous QHS   insulin lispro 1-8 Units Subcutaneous TID AC   lactobacillus/streptococcus 1 capsule Oral Daily   methylPREDNISolone 20 mg Intravenous Q6H   pantoprazole 40 mg Oral QAM AC   piperacillin-tazobactam 2.25 g Intravenous Q6H   tamsulosin 0.4 mg Oral QD after dinner         Continuous Infusions:        PRN Meds:acetaminophen **OR** acetaminophen, ALPRAZolam, benzonatate, calcium carbonate, Nursing communication: Adult Hypoglycemia Treatment Algorithm **AND** dextrose **AND** dextrose **AND** glucagon (rDNA), naloxone, ondansetron, zolpidem

## 2017-08-27 NOTE — Progress Notes (Signed)
Dr. Sharma Covert, during rounds, told the Case Manager that patient may be transferred out of the ICU today, but will stay in the hospital over the weekend.      [facility] cath will stay in and nephrology will continue to monitor patient's renal function as documented.      PT recommends acute rehab.  OT eval is pending at this time.  CM has discussed this information with the patient and he prefers to discuss with his wife and then make his decision.  CM will follow up as needed.

## 2017-08-27 NOTE — Progress Notes (Signed)
Pt transported to room 217A via wheelchair operated by hospital transporter.  Confirmed with tele tech prior to leaving unit that pt showing on their monitor.  All belongings packed and sent with pt including pt chart and medications.  Pt accompanied by wife during transport.

## 2017-08-28 ENCOUNTER — Inpatient Hospital Stay: Payer: Commercial Managed Care - POS

## 2017-08-28 DIAGNOSIS — N179 Acute kidney failure, unspecified: Secondary | ICD-10-CM

## 2017-08-28 DIAGNOSIS — A419 Sepsis, unspecified organism: Principal | ICD-10-CM

## 2017-08-28 LAB — BASIC METABOLIC PANEL
Anion Gap: 8 (ref 5.0–15.0)
BUN: 28.1 mg/dL — ABNORMAL HIGH (ref 9.0–28.0)
CO2: 26 mEq/L (ref 22–29)
Calcium: 8.8 mg/dL (ref 7.9–10.2)
Chloride: 103 mEq/L (ref 100–111)
Creatinine: 1.3 mg/dL (ref 0.7–1.3)
Glucose: 267 mg/dL — ABNORMAL HIGH (ref 70–100)
Potassium: 4.9 mEq/L (ref 3.5–5.1)
Sodium: 137 mEq/L (ref 136–145)

## 2017-08-28 LAB — GFR: EGFR: >60

## 2017-08-28 LAB — GLUCOSE WHOLE BLOOD - POCT
Whole Blood Glucose POCT: 238 mg/dL — ABNORMAL HIGH (ref 70–100)
Whole Blood Glucose POCT: 307 mg/dL — ABNORMAL HIGH (ref 70–100)
Whole Blood Glucose POCT: 303 mg/dL — ABNORMAL HIGH (ref 70–100)
Whole Blood Glucose POCT: 376 mg/dL — ABNORMAL HIGH (ref 70–100)

## 2017-08-28 LAB — CBC
Absolute NRBC: 0 10*3/uL
Hematocrit: 40.5 % — ABNORMAL LOW (ref 42.0–52.0)
Hgb: 13.4 g/dL (ref 13.0–17.0)
MCH: 30.7 pg (ref 28.0–32.0)
MCHC: 33.1 g/dL (ref 32.0–36.0)
MCV: 92.7 fL (ref 80.0–100.0)
MPV: 12.3 fL (ref 9.4–12.3)
Nucleated RBC: 0 /100 WBC (ref 0.0–1.0)
Platelets: 139 10*3/uL — ABNORMAL LOW (ref 140–400)
RBC: 4.37 10*6/uL — ABNORMAL LOW (ref 4.70–6.00)
RDW: 15 % (ref 12–15)
WBC: 12.01 10*3/uL — ABNORMAL HIGH (ref 3.50–10.80)

## 2017-08-28 LAB — MAGNESIUM: Magnesium: 1.7 mg/dL (ref 1.6–2.6)

## 2017-08-28 MED ORDER — MAGNESIUM SULFATE IN D5W 1-5 GM/100ML-% IV SOLN
1.0000 g | Freq: Once | INTRAVENOUS | Status: AC
Start: 2017-08-28 — End: 2017-08-28
  Administered 2017-08-28: 1 g via INTRAVENOUS
  Filled 2017-08-28: qty 100

## 2017-08-28 MED ORDER — FUROSEMIDE 40 MG PO TABS
40.0000 mg | ORAL_TABLET | Freq: Every day | ORAL | Status: DC
Start: 2017-08-29 — End: 2017-08-30
  Administered 2017-08-29 – 2017-08-30 (×2): 40 mg via ORAL
  Filled 2017-08-28 (×2): qty 1

## 2017-08-28 MED ORDER — PREDNISONE 20 MG PO TABS
20.0000 mg | ORAL_TABLET | Freq: Two times a day (BID) | ORAL | Status: DC
Start: 2017-08-29 — End: 2017-08-29
  Administered 2017-08-29: 20 mg via ORAL
  Filled 2017-08-28: qty 1

## 2017-08-28 NOTE — Plan of Care (Signed)
Problem: Safety  Goal: Patient will be free from injury during hospitalization  Outcome: Progressing   08/27/17 0511   Goal/Interventions addressed this shift   Patient will be free from injury during hospitalization  Assess patient's risk for falls and implement fall prevention plan of care per policy;Provide and maintain safe environment;Use appropriate transfer methods;Ensure appropriate safety devices are available at the bedside;Include patient/ family/ care giver in decisions related to safety;Hourly rounding       Problem: Renal Instability  Goal: Fluid and electrolyte balance are achieved/maintained  Outcome: Progressing   08/27/17 0824   Goal/Interventions addressed this shift   Fluid and electrolyte balance are achieved/maintained  Monitor intake and output every shift;Monitor/assess lab values and report abnormal values;Provide adequate hydration;Monitor daily weight;Assess for confusion/personality changes;Assess and reassess fluid and electrolyte status;Observe for seizure activity and initiate seizure precautions if indicated;Observe for cardiac arrhythmias;Monitor for muscle weakness;Follow fluid restrictions/IV/PO parameters       Problem: Inadequate Gas Exchange  Goal: Adequate oxygenation and improved ventilation  Outcome: Progressing   08/28/17 0028   Goal/Interventions addressed this shift   Adequate oxygenation and improved ventilation Assess lung sounds;Monitor SpO2 and treat as needed;Provide mechanical and oxygen support to facilitate gas exchange;Position for maximum ventilatory efficiency;Teach/reinforce use of incentive spirometer 10 times per hour while awake, cough and deep breath as needed;Plan activities to conserve energy: plan rest periods

## 2017-08-28 NOTE — Progress Notes (Signed)
Pt transferred from ICU to PCU room 217 via wheelchair accompanied with his wife. Pt. Is alert and orentied x4. Room air. No c/o CP/SOb at this time. TodayValues.uy with foley for retention. Safety measures in place. Will continue plan of care.

## 2017-08-28 NOTE — PT Progress Note (Signed)
Physical Therapy Note    [facility]  [address]  [place], [place]. [address]    Department of Rehabilitation  [phone]    Physical Therapy Daily Treatment Note    Patient: [name] [name]    MRN#: [id number]     [id number]/[id number]    Time of Treatment: Start Time: 1406 Stop Time: 1439 Time Calculation (min): 33 min    PT Visit Number: 2    Patient's medical condition is appropriate for Physical Therapy intervention at this time.    Precautions and Contraindications:   Precautions  Other Precautions: fall risk, SOB    Assessment: Pt making progress towards goals - improved gait distance, requiring less assist with bed mobility, transfers and gait, improved activity tolerance. Pt continues with generalized weakness limiting functional tolerance for ambulation and completing standing activities.Patient will benefit from rehab to maximize functional outcome.  Pt would benefit from continues skilled PT to maximize safety and independence with functional tasks.    Assessment: Decreased LE strength;Decreased safety/judgement during functional mobility;Decreased endurance/activity tolerance;Decreased functional mobility;Decreased balance;Gait impairment Prognosis: Good;With continued PT status post acute discharge   Progress: Progressing toward goals     Patient Goal: "to get my strength back"      Plan:   Continue with Physical Therapy services to address the above listed impairments. Focus next session on functional mobility, strengthening, balance and gait.  Treatment/Interventions: Gait training;Neuromuscular re-education;Functional transfer training;LE strengthening/ROM;Endurance training;Bed mobility   PT Frequency: 4-5x/wk     Based on today's session patient's discharge recommendation is the following: Discharge Recommendation: Acute Rehab If Discharge Recommendation: Acute Rehab   DME Recommended for Discharge:  (TBD post-rehab)          Subjective: Patient is agreeable to participation in the  therapy session. Nursing clears patient for therapy.         Objective:  Observation of Patient/Vital Signs:  Patient is in bed with dressings, telemetry and central line in place. SOB with ambulation on room air.  SATs at 100%.  Pt instructed on pursed lip breathing pattern.      Cognition/Neuro Status  Arousal/Alertness: Appropriate responses to stimuli  Attention Span: Appears intact  Orientation Level: Oriented to person;Oriented to place;Disoriented to time  Memory: Appears intact  Following Commands: Follows one step commands with increased time  Safety Awareness: minimal verbal instruction  Insights: Decreased awareness of deficits  Behavior: attentive;calm;cooperative    Functional Mobility  Supine to Sit: Contact Guard Assist;Increased Time;Increased Effort;HOB raised  Scooting to EOB: Stand by Assist;Increased Time;Increased Effort  Sit to Stand: Contact Guard Assist;Increased Time;Increased Effort;bed elevated;with instruction for hand placement to increase safety  Stand to Sit: Stand by Assist (verbal instruction for hand placement)     Locomotion  Ambulation: Contact Guard Assist;Minimal Assist;with front-wheeled walker (mostly CGA with mild unsteadiness)  Pattern: decreased cadence;decreased step length;R foot flat;L foot flat;Step through  [employer] (ft) (Step 6,7): 175 Feet (several rest breaks required with SOB )          Therapeutic Exercise  Hip Flexion: B x 10 (seated marching)  Hip Abduction: B x 10 (lateral marching)  Knee AROM : B x 10  (LAQ's)  Ankle Pumps: B x 30         Neuro Re-Ed  Sitting Balance: without instruction;without support;supervision  Standing Balance: dynamic gait training;with instruction;with support;contact guard assist;minimal assist     Treatment Activities:  Pt educated on safety with all mobility.  Pt instructed to self assess for dizziness  or unsteadiness during transitional movements and if symptoms are present to wait until symptoms subside before standing or  walking. Pt instructed for proper hand placement for transfers for increased safety awareness. Pt instructed to ensure he stays closer to the walker and stand up to maintain postural alignment and balance.  Pt was instructed in there ex per above with focus and facilitation on correct positioning and cadence to maximize quality of each exercise. Pt was instructed to perform ex's 2x a day, 10-20 reps each for strengthening.       Educated the patient to role of physical therapy, plan of care, goals of therapy and HEP, safety with mobility and ADLs, pursed lip breathing.    Patient left without needs and call bell within reach. Bed Alarm set.  RN notified of session outcome.    Rosamaria Lints MPT, CEAS

## 2017-08-28 NOTE — Progress Notes (Addendum)
[name]  Progress Note  Patient Info:   Date/Time: 08/28/2017 / 3:20 PM   Admit Date:08/22/2017  Patient Name:[name] [name]   TKZ:[id number]   PCP: Zorita Pang, MD  Attending Physician:Espen Bethel, Donnita [place], MD     Assessment and Plan:    Acute respiratory failure secondary to presumed Asthma flareup vs Suspected pneumonia, elevated pro-calcitonin 0.12--treating with IV Zosyn for total 5 days, continue low-dose IV steroids, given nebulizers when necessary, breo , placed on incentive spirometry,Acapella valve    Acute kidney injury status post 1 day CRRT , has been off dialysis.  Nephrology following, planted hemodialysis catheters, possibly today   Severe metabolic acidosis--combination of renal failure, possibly sepsis--now improved   Severe obstructive sleep apnea.   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--continue supportive care, Coreg, on home medications, diuresed and is now clinically stable   History of diabetes mellitus, continue sliding scale insulin, Lantus   Hypomagnesemia, replaced with 1 g Magnesium sulfate     Plan of care discussed with the patient, nursing staff  PT, OT recommended acute rehabilitation  Out of bed to chair      DVT Prohylaxis:scd   Central Line/Foley Catheter/PICC line status: None  Code Status: Full Code  Disposition:Home  Type of Admission:Inpatient  Expected Date of Discharge: 2 days  Milestones required for discharge:Pneumonia  Hospital Problems:   Principal Problem:    AKI (acute kidney injury)  Active Problems:    Sepsis    Sepsis, due to unspecified organism    Subjective:   08/28/17   Seen and examined this morning  Denied chest pain, shortness of breath, nausea, vomiting, abdominal pain, diarrhea, constipation  Chief Complaint:  Shortness of Breath; Abdominal Pain; and Chest Pain    Review of Systems   Constitutional: Positive for malaise/fatigue.  Negative for chills, diaphoresis, fever and weight loss.   HENT: Negative for congestion, ear discharge, ear pain, hearing loss, nosebleeds, sinus pain, sore throat and tinnitus.    Eyes: Negative for blurred vision, double vision, photophobia and pain.   Respiratory: Negative for cough, hemoptysis, sputum production, shortness of breath, wheezing and stridor.    Cardiovascular: Negative for chest pain, palpitations, orthopnea, claudication, leg swelling and PND.   Gastrointestinal: Negative for abdominal pain, blood in stool, constipation, diarrhea, heartburn, melena, nausea and vomiting.   Genitourinary: Negative for dysuria, flank pain, frequency, hematuria and urgency.   Musculoskeletal: Positive for back pain. Negative for falls, joint pain, myalgias and neck pain.   Skin: Negative for itching and rash.   Neurological: Positive for weakness. Negative for dizziness, tingling, tremors, sensory change, speech change, focal weakness, seizures, loss of consciousness and headaches.   Psychiatric/Behavioral: Negative for depression, hallucinations, memory loss, substance abuse and suicidal ideas. The patient is nervous/anxious. The patient does not have insomnia.      Objective:     Vitals:    08/28/17 0844 08/28/17 0900 08/28/17 1037 08/28/17 1317   BP: (!) 137/93  (!) 147/91 (!) 141/91   Pulse: 94 97 95 90   Resp:  17 16 19    Temp:   97.8 F (36.6 C) 98.2 F (36.8 C)   TempSrc:   Temporal Artery Oral   SpO2:   95% 97%   Weight:       Height:         Physical Exam:   Physical Exam   Patient is awake, alert, oriented 3, not  in acute distress.  HEENT pale conjunctiva noted to have dry mucosa.  Has dialysis catheters noted on the right side of the chest.  Chest clear to auscultation bilaterally  CVS S1, S2 present.  No murmurs, rubs or gallops.  Abdomen soft, nontender, no organomegaly.  Bowel sounds present  Results of Labs/imaging   Labs and radiology reports have been reviewed.    Hospitalist   Signed by:    Marella Chimes  08/28/2017 3:20 PM    *This note was generated by the Epic EMR system/ Dragon speech recognition and may contain inherent errors or omissions not intended by the user. Grammatical errors, random word insertions, deletions, pronoun errors and incomplete sentences are occasional consequences of this technology due to software limitations. Not all errors are caught or corrected. If there are questions or concerns about the content of this note or information contained within the body of this dictation they should be addressed directly with the author for clarification

## 2017-08-28 NOTE — Plan of Care (Signed)
Problem: Safety  Goal: Patient will be free from injury during hospitalization  Outcome: Progressing  Fall risk assessment q shift. Call bell, overhead table, and assistive devices within reach. Pt instructed to call for assistance. Bed alarm activated. Bed in lowest position. No-slip socks on.  Goal: Patient will be free from infection during hospitalization  Outcome: Progressing  VS q 4 hours. Monitor labs. See AM assessment for SIRS screening. Pt on IV ABX and IV steroids.    Problem: Pain  Goal: Pain at adequate level as identified by patient  Outcome: Progressing  Pain assessment q shift and prn. Pain interventions available upon request. PT/OT on board.    Problem: Moderate/High Fall Risk Score >5  Goal: Patient will remain free of falls  Outcome: Progressing  Fall risk assessment q shift. Call bell, overhead table, and assistive devices within reach. Pt instructed to call for assistance. Bed alarm activated. Bed in lowest position. No-slip socks on.    Problem: Hemodynamic Status: Cardiac  Goal: Stable vital signs and fluid balance  Outcome: Progressing  VS q 4 hours. Daily weights. Pt place on tele monitor. I/O q shift. Monitor labs. See AM assessment for cardiovascular and neurovascular statuses.    Problem: Inadequate Tissue Perfusion  Goal: Adequate tissue perfusion will be maintained  Outcome: Progressing  VS q 4 hours. Monitor labs. I/O q shift. Pt minimal assist to chair. Wound Care on board. See AM assessment for integumentary, neurovascular, and musculoskeletal statuses.    Problem: Nutrition  Goal: Nutritional intake is adequate  Outcome: Progressing  Daily weights. Pt tolerating cardiac/consistent-carb diet.    Problem: Impaired Mobility  Goal: Mobility/Activity is maintained at optimal level for patient  Outcome: Progressing  Pt minimal assist to chair. Assist pt with repositioning q 2 hours and prn. VS q 4 hours. Pt tolerating RA. PT/OT on board. See AM assessment for respiratory and  musculoskeletal statuses.    Problem: Renal Instability  Goal: Fluid and electrolyte balance are achieved/maintained  Outcome: Progressing  I/O q shift. Monitor labs; electrolytes. Daily weights. Pt placed on tele monitor. See AM assessment for neurological, cardiovascular, and musculoskeletal statuses.  Goal: Perineal skin integrity is maintained or improved  Outcome: Progressing  See AM assessment for integumentary and genitourinary statuses. Wound Care on board. Foley care in place.  Goal: Free from infection  Outcome: Progressing  VS q 4 hours. Monitor labs. See AM assessment for SIRS screening. Pt on IV ABX and IV steroids. Foley care in place. See AM assessment for genitourinary status.    Problem: Compromised Tissue integrity  Goal: Damaged tissue is healing and protected  Outcome: Progressing  See AM assessment for Braden Scale, integumentary, and musculoskeletal statuses. Assist with repositioning q 2 hours and prn. Pt minimal assist to chair. Foley care in place. CVC and permacath care in place. Wound Care on board.  Goal: Nutritional status is improving  Outcome: Progressing  Pt tolerating cardiac/consistent-carb diet.

## 2017-08-28 NOTE — Progress Notes (Signed)
Pulmonary Progress Note  "[facility]"    Date Time: 08/28/17 6:11 PM  Patient Name: [name] [name]  Attending Physician: Marella Chimes, MD      Assessment:     Patient Active Problem List   Diagnosis   . Chest pain   . Diabetes mellitus   . Heart attack   . CAD (coronary artery disease)   . Non-ischemic cardiomyopathy   . Acute exacerbation of CHF (congestive heart failure)   . PUD (peptic ulcer disease)   . Elevated LFTs   . Acute dyspnea   . Elevated d-dimer   . CHF (congestive heart failure)   . Headaches due to old head injury   . Essential hypertension   . Pneumonia due to infectious organism, unspecified laterality, unspecified part of lung   . Syncope and collapse   . AICD (automatic cardioverter/defibrillator) present   . Syncope, unspecified syncope type   . Pneumonia due to infectious organism   . Dyspnea, unspecified type   . Cardiomyopathy   . Cough   . Sepsis   . AKI (acute kidney injury)   . Sepsis, due to unspecified organism           Plan:   Will switch to  Prednisone,  Discontinue Zosyn.   Recommend diuretic- check with cardiologist and renal.  May be ready to be discharged home soon.  HD catheter can be removed.        Interval History   HPI: Patient is subjectively much better. He is ambulating in the hall. No orthopnea present   Recent chest x-ray was clear. Note that the PCT was only very minimally elevated where as BNO was markedly elevated. His symptoms leading up to admission were suggestive of CHF.       Interval History/24 hr. Events: nonre    Physical Exam:   Vital signs for last 24 hours:  Temp:  [97.2 F (36.2 C)-98.2 F (36.8 C)] 98.2 F (36.8 C)  Heart Rate:  [81-97] 90  Resp Rate:  [16-25] 19  BP: (125-159)/(81-97) 141/91    Intake and Output Summary (Last 24 hours) at Date Time    Intake/Output Summary (Last 24 hours) at 08/28/17 1811  Last data filed at 08/28/17 1415   Gross per 24 hour   Intake             1130 ml   Output             1850 ml    Net             -720 ml       General: awake, alert, oriented x 3; no acute distress; able to speak in complete sentences without accessory muscle use; in   HEENT: pupils equal with EOMI; no thyromegaly, no cervical LN, no thrush.  Cardiovascular: regular rate and rhythm, no murmurs, rubs or gallops. No P2/S2 present. No R sided heave. No JVD, no HJR.  Lungs: clear to auscultation bilaterally, without wheezing, rhonchi, or rales.  Abdomen: soft, non-tender, non-distended; no palpable masses, no hepatosplenomegaly, normoactive bowel sounds, no rebound or guarding  Extremities: no clubbing, cyanosis, or edema.  Neuro: Normal sensory and motor systems. Able to ambulate.  Derm: No rashes.  Musculoskeletal: nl ROM, no muscle weakness.      Meds:   Scheduled Meds:  Current Facility-Administered Medications   Medication Dose Route Frequency   . albuterol-ipratropium  3 mL Nebulization Q6H SCH   . amitriptyline  25  mg Oral QHS   . atorvastatin  40 mg Oral QHS   . carvedilol  6.25 mg Oral Q12H SCH   . docusate sodium  100 mg Oral BID   . fluticasone furoate-vilanterol  1 puff Inhalation QAM   . insulin glargine  50 Units Subcutaneous QHS   . insulin lispro  1-4 Units Subcutaneous QHS   . insulin lispro  1-8 Units Subcutaneous TID AC   . lactobacillus/streptococcus  1 capsule Oral Daily   . methylPREDNISolone  20 mg Intravenous Q6H   . pantoprazole  40 mg Oral QAM AC   . piperacillin-tazobactam  2.25 g Intravenous Q6H   . tamsulosin  0.4 mg Oral QD after dinner     Continuous Infusions:  PRN Meds:.acetaminophen **OR** acetaminophen, ALPRAZolam, benzonatate, calcium carbonate, Nursing communication: Adult Hypoglycemia Treatment Algorithm **AND** dextrose **AND** dextrose **AND** glucagon (rDNA), naloxone, ondansetron, zolpidem      Labs:     Results     Procedure Component Value Units Date/Time    Glucose Whole Blood - POCT [[id number]]  (Abnormal) Collected:  08/28/17 1639     Updated:  08/28/17 1648     POCT - Glucose Whole  blood 303 (H) mg/dL     Glucose Whole Blood - POCT [[id number]]  (Abnormal) Collected:  08/28/17 1119     Updated:  08/28/17 1128     POCT - Glucose Whole blood 307 (H) mg/dL     Glucose Whole Blood - POCT [[id number]]  (Abnormal) Collected:  08/28/17 0737     Updated:  08/28/17 0740     POCT - Glucose Whole blood 238 (H) mg/dL     Basic Metabolic Panel [[id number]]  (Abnormal) Collected:  08/28/17 0340    Specimen:  Blood Updated:  08/28/17 0410     Glucose 267 (H) mg/dL      BUN 62.9 (H) mg/dL      Creatinine 1.3 mg/dL      Calcium 8.8 mg/dL      Sodium 528 mEq/L      Potassium 4.9 mEq/L      Chloride 103 mEq/L      CO2 26 mEq/L      Anion Gap 8.0    Magnesium [[id number]] Collected:  08/28/17 0340    Specimen:  Blood Updated:  08/28/17 0410     Magnesium 1.7 mg/dL     GFR [[id number]] Collected:  08/28/17 0340     Updated:  08/28/17 0410     EGFR >60.0    CBC without differential [[id number]]  (Abnormal) Collected:  08/28/17 0340    Specimen:  Blood from Blood Updated:  08/28/17 0356     WBC 12.01 (H) x10 3/uL      Hgb 13.4 g/dL      Hematocrit 63.8 (L) %      Platelets 139 (L) x10 3/uL      RBC 4.37 (L) x10 6/uL      MCV 92.7 fL      MCH 30.7 pg      MCHC 33.1 g/dL      RDW 15 %      MPV 12.3 fL      Nucleated RBC 0.0 /100 WBC      Absolute NRBC 0.00 x10 3/uL     Blood Culture Aerobic/Anaerobic #1 [[id number]] Collected:  08/22/17 1433    Specimen:  Arm from Blood, Intravenous Line Updated:  08/28/17 0221    Narrative:       ORDER#: [id number]  ORDERED BY: MEHTA, SAMEER  SOURCE: Blood, Intravenous Line "right hand"         COLLECTED:  08/22/17 14:33  ANTIBIOTICS AT COLL.:                                RECEIVED :  08/22/17 23:25  Culture Blood Aerobic and Anaerobic        FINAL       08/28/17 02:21  08/28/17   No growth after 5 days of incubation.      Glucose Whole Blood - POCT [[id number]]  (Abnormal) Collected:  08/27/17 2124     Updated:  08/27/17 2127     POCT - Glucose Whole blood  347 (H) mg/dL     Culture Blood Aerobic and Anaerobic [[id number]] Collected:  08/24/17 1211    Specimen:  Blood, Intravenous Line Updated:  08/27/17 1921    Narrative:       Indications:->Pneumonia  ORDER#: [id number]                                    ORDERED BY: Eddie North  SOURCE: Blood, Intravenous Line c line               COLLECTED:  08/24/17 12:11  ANTIBIOTICS AT COLL.:                                RECEIVED :  08/24/17 19:19  Culture Blood Aerobic and Anaerobic        PRELIM      08/27/17 19:21  08/25/17   No Growth after 1 day/s of incubation.  08/26/17   No Growth after 2 day/s of incubation.  08/27/17   No Growth after 3 day/s of incubation.              Radiology:     Radiology Results (24 Hour)     Procedure Component Value Units Date/Time    XR Chest AP Portable [[id number]] Collected:  08/28/17 1120    Order Status:  Completed Updated:  08/28/17 1125    Narrative:       HISTORY: Pneumonia.    COMPARISON: Examination 4 days ago.    FINDINGS: Single portable view of the chest was obtained. Perihilar and  basilar predominant airspace opacities have resolved. The lungs are  clear. The heart remains enlarged with an AICD in place. Right internal  jugular central venous catheters are unchanged. There is no  pneumothorax.      Impression:        Clear lungs.    Elizebeth Koller, MD   08/28/2017 11:20 AM            Signed by: Kendra Opitz, MD  Date/Time: 08/28/2017 6:11 PM

## 2017-08-28 NOTE — Progress Notes (Signed)
Pt provided with educational materials on pneumonia, as well as Commitment to Care sheet.

## 2017-08-28 NOTE — Progress Notes (Signed)
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%,S/P AICD:followed by [facility]  -Hypotension:resolved  -VWU:[id number]  -SIRS:better  -Acute resp failure with hypoxia  -Abdominal pain of unclear etiology  -DM II with hyperglycemia      Plan:   -Renal fx recovered,cr 1.3 and non oliguric  -D/C central line and dialysis cath  -Will need diuretic on board given the cardiomyopathy  -D/W Dr/.Devan and pt has no evidence of COPD/never been a  Smoker or pneumonia.Agree with d/c abx  -Lasix 40 mg IV now and lasix 40 mg po daily from am  -Low sodium diet  -Increase activity  D/W pt's nurse and wife    Eddie North, M.D  [facility]  [phone]  Subjective:   No new complaints.    Some increased dyspnea with exertion  No Chest pain  No Abdominal pain  No Nausea/Vomiting  No Diarrhea      Objective:   Vital signs in last 24 hours:  Temp:  [97.2 F (36.2 C)-98.2 F (36.8 C)] 97.7 F (36.5 C)  Heart Rate:  [81-97] 92  Resp Rate:  [16-22] 20  BP: (125-159)/(81-97) 139/93     General:    In NAD    HEENT:   Conjunctiva pale, Dry mucosa          CVS:  RRR, no rub        Chest: rales at bases  Abdomen:   Soft, nontender           Ext.:  Edema+      Intake/Output:   Intake/Output last 24 hours:    Intake/Output Summary (Last 24 hours) at 08/28/17 1908  Last data filed at 08/28/17 1853   Gross per 24 hour   Intake             1370 ml   Output             2030 ml   Net             -660 ml     Intake/Output this shift:  No intake/output data recorded.      Labs:       Recent Labs  Lab 08/28/17  0340  08/27/17  0425 08/26/17  1552 08/26/17  0812 08/26/17  0142 08/25/17  2001 08/25/17  1420 08/25/17  0805   Sodium 137 137 135* 138 137 135* 137 136   Potassium 4.9 4.7 4.6 3.5 4.0 4.3 4.4 4.2   Chloride 103 103 101 102 101 101 101 101   CO2 26 29 28  30* 23 25 23 26    BUN 28.1* 25.7 22.6 17.9 23.5 26.8 30.7* 35.1*   Creatinine 1.3 1.4* 1.5* 1.2 1.5* 1.7* 1.9* 2.2*   Glucose 267* 257* 237* 137* 245* 266* 270* 202*   Calcium 8.8 8.9 9.4 10.0 9.5 9.7 9.4 9.4   Magnesium 1.7 2.1 1.9  1.7 1.7 1.7 1.7 1.8   Phosphorus  --   --   --  2.9 3.0 3.3 3.6 4.4   EGFR >60.0 >60.0 55.9 >60.0 55.9 48.4 42.5 35.9       Recent Labs  Lab 08/28/17  0340 08/27/17  0425 08/26/17  0145   WBC 12.01* 11.56* 12.40*   Hgb 13.4 12.4* 12.7*   Hematocrit 40.5* 37.9* 38.9*   Platelets 139* 137* 130*                   Medications:   Scheduled Meds:       albuterol-ipratropium 3 mL Nebulization Q6H SCH   amitriptyline 25 mg Oral QHS   atorvastatin 40 mg Oral QHS   carvedilol 6.25 mg Oral Q12H SCH   docusate sodium 100 mg Oral BID   fluticasone furoate-vilanterol 1 puff Inhalation QAM   [START ON 08/29/2017] furosemide 40 mg Oral Daily   insulin glargine 50 Units Subcutaneous QHS   insulin lispro 1-4 Units Subcutaneous QHS   insulin lispro 1-8 Units Subcutaneous TID AC   lactobacillus/streptococcus 1 capsule Oral Daily   pantoprazole 40 mg Oral QAM AC   [START ON 08/29/2017] predniSONE 20 mg Oral BID Meals   tamsulosin 0.4 mg Oral QD after dinner         Continuous Infusions:        PRN Meds:acetaminophen **OR** acetaminophen, ALPRAZolam, benzonatate, calcium carbonate, Nursing communication: Adult Hypoglycemia Treatment Algorithm **AND** dextrose **AND** dextrose **AND** glucagon (rDNA), naloxone, ondansetron, zolpidem

## 2017-08-29 LAB — CBC
Absolute NRBC: 0.02 10*3/uL — ABNORMAL HIGH
Hematocrit: 41.4 % — ABNORMAL LOW (ref 42.0–52.0)
Hgb: 13.7 g/dL (ref 13.0–17.0)
MCH: 30.7 pg (ref 28.0–32.0)
MCHC: 33.1 g/dL (ref 32.0–36.0)
MCV: 92.8 fL (ref 80.0–100.0)
MPV: 12.1 fL (ref 9.4–12.3)
Nucleated RBC: 0.1 /100 WBC (ref 0.0–1.0)
Platelets: 148 10*3/uL (ref 140–400)
RBC: 4.46 10*6/uL — ABNORMAL LOW (ref 4.70–6.00)
RDW: 15 % (ref 12–15)
WBC: 15.54 10*3/uL — ABNORMAL HIGH (ref 3.50–10.80)

## 2017-08-29 LAB — BASIC METABOLIC PANEL
Anion Gap: 4 — ABNORMAL LOW (ref 5.0–15.0)
BUN: 26.4 mg/dL (ref 9.0–28.0)
CO2: 29 mEq/L (ref 22–29)
Calcium: 8.8 mg/dL (ref 7.9–10.2)
Chloride: 105 mEq/L (ref 100–111)
Creatinine: 1.2 mg/dL (ref 0.7–1.3)
Glucose: 176 mg/dL — ABNORMAL HIGH (ref 70–100)
Potassium: 4.6 mEq/L (ref 3.5–5.1)
Sodium: 138 mEq/L (ref 136–145)

## 2017-08-29 LAB — MAGNESIUM: Magnesium: 2 mg/dL (ref 1.6–2.6)

## 2017-08-29 LAB — GLUCOSE WHOLE BLOOD - POCT
Whole Blood Glucose POCT: 131 mg/dL — ABNORMAL HIGH (ref 70–100)
Whole Blood Glucose POCT: 205 mg/dL — ABNORMAL HIGH (ref 70–100)
Whole Blood Glucose POCT: 221 mg/dL — ABNORMAL HIGH (ref 70–100)
Whole Blood Glucose POCT: 358 mg/dL — ABNORMAL HIGH (ref 70–100)

## 2017-08-29 LAB — GFR: EGFR: >60

## 2017-08-29 MED ORDER — MAGNESIUM CITRATE 1.745 GM/30ML PO SOLN
296.0000 mL | Freq: Once | ORAL | Status: AC
Start: 2017-08-29 — End: 2017-08-29
  Administered 2017-08-29: 296 mL via ORAL
  Filled 2017-08-29: qty 296

## 2017-08-29 MED ORDER — BISACODYL 5 MG PO TBEC
10.0000 mg | DELAYED_RELEASE_TABLET | Freq: Every day | ORAL | Status: DC | PRN
Start: 2017-08-29 — End: 2017-08-30

## 2017-08-29 MED ORDER — PREDNISONE 10 MG PO TABS
10.0000 mg | ORAL_TABLET | Freq: Two times a day (BID) | ORAL | Status: DC
Start: 2017-08-29 — End: 2017-08-30
  Administered 2017-08-29 – 2017-08-30 (×3): 10 mg via ORAL
  Filled 2017-08-29 (×3): qty 1

## 2017-08-29 NOTE — Progress Notes (Signed)
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%,S/P AICD:followed by [facility]  -Hypotension:resolved  -VWU:[id number]  -SIRS:better  -Acute resp failure with hypoxia  -Abdominal pain of unclear etiology  -DM II with hyperglycemia    Plan:   -Renal fx recovered,cr 1.2 and non oliguric  -D/C'd central line and dialysis cath  -Will need diuretic on board given the cardiomyopathy  -D/W Dr/.Devan and pt has no evidence of COPD/never been a  Smoker or pneumonia.Agree with d/c abx  -lasix 40 mg po daily from am  -Low sodium diet  -Increase activity and acute rehab transfer when bed available  -Would stop steroids  -OP follow up with me in 2-4 weeks after acute rehab  D/W pt's nurse and wife    Eddie North, M.D  [facility] [place]  [phone]  Subjective:   No new complaints.    No SOB  No Chest pain  No Abdominal pain  No Nausea/Vomiting  No Diarrhea      Objective:   Vital signs in last 24 hours:  Temp:  [97.2 F (36.2 C)-97.9 F (36.6 C)] 97.3 F (36.3 C)  Heart Rate:  [82-96] 96  Resp Rate:  [16-20] 16  BP: (127-148)/(84-98) 130/98     General:    In NAD    HEENT:   Conjunctiva pale, Dry mucosa          CVS:  RRR, no rub        Chest:  CTA/Bilaterally  Abdomen:   Soft, nontender           Ext.:   No edema      Intake/Output:   Intake/Output last 24 hours:    Intake/Output Summary (Last 24 hours) at 08/29/17 1508  Last data filed at 08/29/17 1336   Gross per 24 hour   Intake              720 ml   Output             1805 ml   Net            -1085 ml     Intake/Output this shift:  I/O this  shift:  In: 120 [P.O.:120]  Out: 825 [Urine:825]      Labs:       Recent Labs  Lab 08/29/17  0540 08/28/17  0340 08/27/17  0425 08/26/17  1552 08/26/17  0812 08/26/17  0142 08/25/17  2001 08/25/17  1420 08/25/17  0805   Sodium 138 137 137 135* 138 137 135* 137 136   Potassium 4.6 4.9 4.7 4.6 3.5 4.0 4.3 4.4 4.2   Chloride 105 103 103 101 102 101 101 101 101   CO2 29 26 29 28  30* 23 25 23 26    BUN 26.4 28.1* 25.7 22.6 17.9 23.5 26.8 30.7* 35.1*   Creatinine 1.2 1.3 1.4* 1.5* 1.2 1.5* 1.7* 1.9*  2.2*   Glucose 176* 267* 257* 237* 137* 245* 266* 270* 202*   Calcium 8.8 8.8 8.9 9.4 10.0 9.5 9.7 9.4 9.4   Magnesium 2.0 1.7 2.1 1.9 1.7 1.7 1.7 1.7 1.8   Phosphorus  --   --   --   --  2.9 3.0 3.3 3.6 4.4   EGFR >60.0 >60.0 >60.0 55.9 >60.0 55.9 48.4 42.5 35.9       Recent Labs  Lab 08/29/17  0540 08/28/17  0340 08/27/17  0425   WBC 15.54* 12.01* 11.56*   Hgb 13.7 13.4 12.4*   Hematocrit 41.4* 40.5* 37.9*   Platelets 148 139* 137*                   Medications:   Scheduled Meds:       albuterol-ipratropium 3 mL Nebulization Q6H SCH   amitriptyline 25 mg Oral QHS   atorvastatin 40 mg Oral QHS   carvedilol 6.25 mg Oral Q12H SCH   docusate sodium 100 mg Oral BID   fluticasone furoate-vilanterol 1 puff Inhalation QAM   furosemide 40 mg Oral Daily   insulin glargine 50 Units Subcutaneous QHS   insulin lispro 1-4 Units Subcutaneous QHS   insulin lispro 1-8 Units Subcutaneous TID AC   lactobacillus/streptococcus 1 capsule Oral Daily   pantoprazole 40 mg Oral QAM AC   predniSONE 20 mg Oral BID Meals   tamsulosin 0.4 mg Oral QD after dinner         Continuous Infusions:        PRN Meds:acetaminophen **OR** acetaminophen, ALPRAZolam, benzonatate, calcium carbonate, Nursing communication: Adult Hypoglycemia Treatment Algorithm **AND** dextrose **AND** dextrose **AND** glucagon (rDNA), naloxone, ondansetron, zolpidem

## 2017-08-29 NOTE — Plan of Care (Signed)
Problem: Safety  Goal: Patient will be free from injury during hospitalization  Outcome: Progressing   08/29/17 0041   Goal/Interventions addressed this shift   Patient will be free from injury during hospitalization  Assess patient's risk for falls and implement fall prevention plan of care per policy;Provide and maintain safe environment;Use appropriate transfer methods;Ensure appropriate safety devices are available at the bedside;Include patient/ family/ care giver in decisions related to safety;Hourly rounding;Assess for patients risk for elopement and implement Elopement Risk Plan per policy;Provide alternative method of communication if needed (communication boards, writing)

## 2017-08-29 NOTE — Progress Notes (Signed)
Spoke to Dr Vladimir Faster and asked if central line could be removed at the time that [place] catheter was removed. States central line can be removed.

## 2017-08-29 NOTE — Plan of Care (Signed)
Problem: Safety  Goal: Patient will be free from injury during hospitalization  Outcome: Progressing   08/29/17 0041   Goal/Interventions addressed this shift   Patient will be free from injury during hospitalization  Assess patient's risk for falls and implement fall prevention plan of care per policy;Provide and maintain safe environment;Use appropriate transfer methods;Ensure appropriate safety devices are available at the bedside;Include patient/ family/ care giver in decisions related to safety;Hourly rounding;Assess for patients risk for elopement and implement Elopement Risk Plan per policy;Provide alternative method of communication if needed (communication boards, writing)       Problem: Hemodynamic Status: Cardiac  Goal: Stable vital signs and fluid balance  Outcome: Progressing   08/28/17 1626   Goal/Interventions addressed this shift   Stable vital signs and fluid balance Monitor/assess vital signs and telemetry per unit protocol;Weigh on admission and record weight daily;Assess signs and symptoms associated with cardiac rhythm changes;Monitor intake/output per unit protocol and/or LIP order;Monitor lab values;Monitor for leg swelling/edema and report to LIP if abnormal

## 2017-08-29 NOTE — Progress Notes (Signed)
Pulmonary Progress Note  "[facility]"    Date Time: 08/29/17 7:37 PM  Patient Name: [name] [name]  Attending Physician: Marella Chimes, MD      Assessment:     Patient Active Problem List   Diagnosis   . Chest pain   . Diabetes mellitus   . Heart attack   . CAD (coronary artery disease)   . Non-ischemic cardiomyopathy   . Acute exacerbation of CHF (congestive heart failure)   . PUD (peptic ulcer disease)   . Elevated LFTs   . Acute dyspnea   . Elevated d-dimer   . CHF (congestive heart failure)   . Headaches due to old head injury   . Essential hypertension   . Pneumonia due to infectious organism, unspecified laterality, unspecified part of lung   . Syncope and collapse   . AICD (automatic cardioverter/defibrillator) present   . Syncope, unspecified syncope type   . Pneumonia due to infectious organism   . Dyspnea, unspecified type   . Cardiomyopathy   . Cough   . Sepsis   . AKI (acute kidney injury)   . Sepsis, due to unspecified organism           Plan:   Continue Prednisone.  Wean off Prednisone over the ned 4-5 days.  Will need to have CPAP titration on Nov 22 for his severe Central sleep Apnea  In our sleep lab.  Continue Breo.      Interval History   HPI: Patient is subjectively much better. Lasix was started today. He claims to be having a good response. Off antibiotics.  On Prednisone 20 mg daily.  Patient anticipated going to rehab in AM.      Interval History/24 hr. Events: none.    Physical Exam:   Vital signs for last 24 hours:  Temp:  [97.2 F (36.2 C)-97.9 F (36.6 C)] 97.7 F (36.5 C)  Heart Rate:  [82-102] 102  Resp Rate:  [16-20] 20  BP: (127-148)/(84-98) 133/86    Intake and Output Summary (Last 24 hours) at Date Time    Intake/Output Summary (Last 24 hours) at 08/29/17 1937  Last data filed at 08/29/17 1621   Gross per 24 hour   Intake              480 ml   Output             1825 ml   Net            -1345 ml       General: awake, alert, oriented x 3; no  acute distress; able to speak in complete sentences without accessory muscle use; in   HEENT: pupils equal with EOMI; no thyromegaly, no cervical LN, no thrush.  Cardiovascular: regular rate and rhythm, no murmurs, rubs or gallops. No P2/S2 present. No R sided heave. No JVD, no HJR.  Lungs: clear to auscultation bilaterally, without wheezing, rhonchi, or rales.  Abdomen: soft, non-tender, non-distended; no palpable masses, no hepatosplenomegaly, normoactive bowel sounds, no rebound or guarding  Extremities: no clubbing, cyanosis, or edema.      Meds:   Scheduled Meds:  Current Facility-Administered Medications   Medication Dose Route Frequency   . albuterol-ipratropium  3 mL Nebulization Q6H SCH   . amitriptyline  25 mg Oral QHS   . atorvastatin  40 mg Oral QHS   . carvedilol  6.25 mg Oral Q12H SCH   . docusate sodium  100 mg Oral BID   .  fluticasone furoate-vilanterol  1 puff Inhalation QAM   . furosemide  40 mg Oral Daily   . insulin glargine  50 Units Subcutaneous QHS   . insulin lispro  1-4 Units Subcutaneous QHS   . insulin lispro  1-8 Units Subcutaneous TID AC   . lactobacillus/streptococcus  1 capsule Oral Daily   . pantoprazole  40 mg Oral QAM AC   . predniSONE  10 mg Oral BID Meals   . tamsulosin  0.4 mg Oral QD after dinner     Continuous Infusions:  PRN Meds:.acetaminophen **OR** acetaminophen, ALPRAZolam, benzonatate, calcium carbonate, Nursing communication: Adult Hypoglycemia Treatment Algorithm **AND** dextrose **AND** dextrose **AND** glucagon (rDNA), naloxone, ondansetron, zolpidem      Labs:     Results     Procedure Component Value Units Date/Time    Culture Blood Aerobic and Anaerobic [[id number]] Collected:  08/24/17 1211    Specimen:  Blood, Intravenous Line Updated:  08/29/17 1921    Narrative:       Indications:->Pneumonia  ORDER#: [id number]                                    ORDERED BY: Eddie North  SOURCE: Blood, Intravenous Line c line               COLLECTED:  08/24/17  12:11  ANTIBIOTICS AT COLL.:                                RECEIVED :  08/24/17 19:19  Culture Blood Aerobic and Anaerobic        PRELIM      08/29/17 19:21  08/25/17   No Growth after 1 day/s of incubation.  08/26/17   No Growth after 2 day/s of incubation.  08/27/17   No Growth after 3 day/s of incubation.  08/28/17   No Growth after 4 day/s of incubation.  08/29/17   No Growth after 5 day/s of incubation.      CULTURE + Dierdre Forth [[id number]] Collected:  08/29/17 1057    Specimen:  Sputum, Expectorated Updated:  08/29/17 1840    Narrative:       ORDER#: [id number]                                    ORDERED BY: DUBEY, ADITYA  SOURCE: Sputum, Expectorated Expectorated            COLLECTED:  08/29/17 10:57  ANTIBIOTICS AT COLL.:                                RECEIVED :  08/29/17 17:45  Stain, Gram (Respiratory)                  FINAL       08/29/17 18:40  08/29/17   Rare WBC's             Few Mixed Respiratory Flora             No Squamous epithelial cells  Culture and Gram Stain, Aerobic, RespiratorPENDING      Glucose Whole Blood - POCT [[id number]]  (Abnormal) Collected:  08/29/17 1620     Updated:  08/29/17 1626     POCT -  Glucose Whole blood 221 (H) mg/dL     Glucose Whole Blood - POCT [[id number]]  (Abnormal) Collected:  08/29/17 1109     Updated:  08/29/17 1119     POCT - Glucose Whole blood 205 (H) mg/dL     Glucose Whole Blood - POCT [[id number]]  (Abnormal) Collected:  08/29/17 0718     Updated:  08/29/17 0731     POCT - Glucose Whole blood 131 (H) mg/dL     GFR [[id number]] Collected:  08/29/17 0540     Updated:  08/29/17 0615     EGFR >60.0    Basic Metabolic Panel [[id number]]  (Abnormal) Collected:  08/29/17 0540    Specimen:  Blood Updated:  08/29/17 0615     Glucose 176 (H) mg/dL      BUN 62.9 mg/dL      Creatinine 1.2 mg/dL      Calcium 8.8 mg/dL      Sodium 528 mEq/L      Potassium 4.6 mEq/L      Chloride 105 mEq/L      CO2 29 mEq/L      Anion Gap 4.0 (L)    Magnesium [[id number]]  Collected:  08/29/17 0540    Specimen:  Blood Updated:  08/29/17 0615     Magnesium 2.0 mg/dL     CBC without differential [[id number]]  (Abnormal) Collected:  08/29/17 0540    Specimen:  Blood from Blood Updated:  08/29/17 0558     WBC 15.54 (H) x10 3/uL      Hgb 13.7 g/dL      Hematocrit 03.4 (L) %      Platelets 148 x10 3/uL      RBC 4.46 (L) x10 6/uL      MCV 92.8 fL      MCH 30.7 pg      MCHC 33.1 g/dL      RDW 15 %      MPV 12.1 fL      Nucleated RBC 0.1 /100 WBC      Absolute NRBC 0.02 (H) x10 3/uL     Glucose Whole Blood - POCT [[id number]]  (Abnormal) Collected:  08/28/17 2113     Updated:  08/28/17 2117     POCT - Glucose Whole blood 376 (H) mg/dL             Radiology:     Radiology Results (24 Hour)     ** No results found for the last 24 hours. **            Signed by: Kendra Opitz, MD  Date/Time: 08/29/2017 7:37 PM

## 2017-08-29 NOTE — Progress Notes (Addendum)
11/11 RNCM: CM met with pt today per request of Dr. Vladimir Faster to discuss acute rehab transfer. CM discussed in/outs of acute rehab in length. Pt wanted to discuss with spouse this afternoon around lunch. CM checked back with pt this afternoon and pt has decided to transfer to [facility]. CM placed referral in [facility]. Pt will be ready for [facility] tomorrow. Weekday CM to FU with [facility] in am.     Spouse wants to transport pt to rehab center.

## 2017-08-29 NOTE — Progress Notes (Signed)
[name] HOSPITAL[facility]  Progress Note  Patient Info:   Date/Time: 08/29/2017 / 3:24 PM   Admit Date:08/22/2017  Patient Name:[name] [name]   YQM:[id number]   PCP: Zorita Pang, MD  Attending Physician:[place], [place], MD     Assessment and Plan:    Acute respiratory failure resolved    Suspected  presumed Asthma flareup ,no evidence of  pneumonia, elevated pro-calcitonin 0.12--s/p  IV Zosyn for  4 days, taper po   steroids, given nebulizers when necessary, breo , placed on incentive spirometry,Acapella valve    Acute kidney injury status post 1 day CRRT , has been off dialysis.  Nephrology following, removed central line and  hemodialysis catheters   Severe metabolic acidosis--combination of renal failure, possibly sepsis--now improved   Severe obstructive sleep apnea.   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--continue supportive care, Coreg, plan to start  lasix in am    History of diabetes mellitus, continue sliding scale insulin, Lantus   Hypomagnesemia, replaced with 1 g Magnesium sulfate     Plan of care discussed with the patient, nursing staff  PT, OT recommended acute rehabilitation  Out of bed to chair  Case manager informed of need of placement       DVT Prohylaxis:scd   Central Line/Foley Catheter/PICC line status: None  Code Status: Full Code  Disposition:Home  Type of Admission:Inpatient  Expected Date of Discharge: 2 days  Milestones required for discharge:Pneumonia  Hospital Problems:   Principal Problem:    AKI (acute kidney injury)  Active Problems:    Sepsis    Sepsis, due to unspecified organism    Subjective:   08/29/17   Seen and examined this morning  No new compliants   Denied chest pain, shortness of breath, nausea, vomiting, abdominal pain, diarrhea, constipation  Chief Complaint:  Shortness of Breath; Abdominal Pain; and Chest Pain    Review of Systems   Constitutional:  Positive for malaise/fatigue. Negative for chills, diaphoresis, fever and weight loss.   HENT: Negative for congestion, ear discharge, ear pain, hearing loss, nosebleeds, sinus pain, sore throat and tinnitus.    Eyes: Negative for blurred vision, double vision, photophobia and pain.   Respiratory: Negative for cough, hemoptysis, sputum production, shortness of breath, wheezing and stridor.    Cardiovascular: Negative for chest pain, palpitations, orthopnea, claudication, leg swelling and PND.   Gastrointestinal: Negative for abdominal pain, blood in stool, constipation, diarrhea, heartburn, melena, nausea and vomiting.   Genitourinary: Negative for dysuria, flank pain, frequency, hematuria and urgency.   Musculoskeletal: Positive for back pain. Negative for falls, joint pain, myalgias and neck pain.   Skin: Negative for itching and rash.   Neurological: Positive for weakness. Negative for dizziness, tingling, tremors, sensory change, speech change, focal weakness, seizures, loss of consciousness and headaches.   Psychiatric/Behavioral: Negative for depression, hallucinations, memory loss, substance abuse and suicidal ideas. The patient is nervous/anxious. The patient does not have insomnia.      Objective:     Vitals:    08/29/17 0154 08/29/17 0530 08/29/17 0849 08/29/17 1336   BP: 134/90 127/86 135/84 (!) 130/98   Pulse: 85 82 86 96   Resp: 18 20 20 16    Temp: 97.9 F (36.6 C) 97.3 F (36.3 C) 97.2 F (36.2 C) 97.3 F (36.3 C)   TempSrc:   Oral    SpO2: 96% 97% 99% 98%   Weight:  104.3 kg (229 lb  15 oz)     Height:         Physical Exam:   Physical Exam   Patient is awake, alert, oriented 3, not in acute distress.  HEENT pale conjunctiva noted to have dry mucosa.  Has dialysis catheters noted on the right side of the chest.  Chest clear to auscultation bilaterally  CVS S1, S2 present.  No murmurs, rubs or gallops.  Abdomen soft, nontender, no organomegaly.  Bowel sounds present  Results of Labs/imaging   Labs  and radiology reports have been reviewed.    Hospitalist   Signed by:   Marella Chimes  08/29/2017 3:24 PM    *This note was generated by the Epic EMR system/ Dragon speech recognition and may contain inherent errors or omissions not intended by the user. Grammatical errors, random word insertions, deletions, pronoun errors and incomplete sentences are occasional consequences of this technology due to software limitations. Not all errors are caught or corrected. If there are questions or concerns about the content of this note or information contained within the body of this dictation they should be addressed directly with the author for clarification

## 2017-08-30 LAB — GLUCOSE WHOLE BLOOD - POCT
Whole Blood Glucose POCT: 150 mg/dL — ABNORMAL HIGH (ref 70–100)
Whole Blood Glucose POCT: 232 mg/dL — ABNORMAL HIGH (ref 70–100)
Whole Blood Glucose POCT: 309 mg/dL — ABNORMAL HIGH (ref 70–100)

## 2017-08-30 MED ORDER — BISACODYL 5 MG PO TBEC
10.0000 mg | DELAYED_RELEASE_TABLET | Freq: Every day | ORAL | 0 refills | Status: DC | PRN
Start: 2017-08-30 — End: 2017-09-16

## 2017-08-30 MED ORDER — ALBUTEROL-IPRATROPIUM 2.5-0.5 (3) MG/3ML IN SOLN
3.0000 mL | Freq: Four times a day (QID) | RESPIRATORY_TRACT | Status: DC | PRN
Start: 2017-08-30 — End: 2017-08-30

## 2017-08-30 MED ORDER — INSULIN GLARGINE 100 UNIT/ML SC SOLN
50.0000 [IU] | Freq: Every evening | SUBCUTANEOUS | 0 refills | Status: DC
Start: 2017-08-30 — End: 2017-09-21

## 2017-08-30 MED ORDER — ATORVASTATIN CALCIUM 40 MG PO TABS
40.0000 mg | ORAL_TABLET | Freq: Every evening | ORAL | 0 refills | Status: DC
Start: 2017-08-30 — End: 2020-03-20

## 2017-08-30 MED ORDER — TAMSULOSIN HCL 0.4 MG PO CAPS
0.4000 mg | ORAL_CAPSULE | Freq: Every day | ORAL | 0 refills | Status: DC
Start: 2017-08-30 — End: 2017-12-01

## 2017-08-30 MED ORDER — CARVEDILOL 6.25 MG PO TABS
6.2500 mg | ORAL_TABLET | Freq: Two times a day (BID) | ORAL | 0 refills | Status: DC
Start: 2017-08-30 — End: 2017-10-01

## 2017-08-30 MED ORDER — PREDNISONE 10 MG PO TABS
ORAL_TABLET | ORAL | 0 refills | Status: DC
Start: 2017-08-30 — End: 2017-09-16

## 2017-08-30 MED ORDER — FUROSEMIDE 40 MG PO TABS
40.0000 mg | ORAL_TABLET | Freq: Every day | ORAL | 0 refills | Status: DC
Start: 2017-08-31 — End: 2017-09-21

## 2017-08-30 NOTE — Progress Notes (Addendum)
Case reviewed and noted [facility] has declined patient, noting patient is too high functioning. Met with patient and his wife this afternoon to inform of above. Provided SNF list for review and requested choices for referral. Wife wants a facility close to home. Has chosen [facility] for now. Requested she continue to review list and provide additional choices. Clinical uploaded into [facility] and referral started. Left message for [facility] to inform of referral and inform patient ready for d/c when insurance auth obtained. CM will continue to follow.    Received return call from [facility] stating insurance Berkley Harvey has been started, however, they are uncertain a same-day decision will be received from insurance.    Marlissa Emerick  Case Manager  [phone]

## 2017-08-30 NOTE — Discharge Summary -  Nursing (Signed)
Patient discharged to [facility] in stable condition accompanied by spouse. Discharge instructions, prescriptions given patient and family verbalized understanding. Report given to [facility]. Patient wheeled off the floor by Nursing staff.

## 2017-08-30 NOTE — Plan of Care (Signed)
Problem: Safety  Goal: Patient will be free from injury during hospitalization  Outcome: Progressing   08/30/17 1114   Goal/Interventions addressed this shift   Patient will be free from injury during hospitalization  Assess patient's risk for falls and implement fall prevention plan of care per policy;Provide and maintain safe environment;Use appropriate transfer methods;Ensure appropriate safety devices are available at the bedside;Include patient/ family/ care giver in decisions related to safety;Hourly rounding;Assess for patients risk for elopement and implement Elopement Risk Plan per policy       Problem: Pain  Goal: Pain at adequate level as identified by patient  Outcome: Progressing   08/30/17 1114   Goal/Interventions addressed this shift   Pain at adequate level as identified by patient Identify patient comfort function goal;Assess for risk of opioid induced respiratory depression, including snoring/sleep apnea. Alert healthcare team of risk factors identified.;Assess pain on admission, during daily assessment and/or before any "as needed" intervention(s);Reassess pain within 30-60 minutes of any procedure/intervention, per Pain Assessment, Intervention, Reassessment (AIR) Cycle       Problem: Hemodynamic Status: Cardiac  Goal: Stable vital signs and fluid balance  Outcome: Progressing   08/30/17 1114   Goal/Interventions addressed this shift   Stable vital signs and fluid balance Monitor/assess vital signs and telemetry per unit protocol;Weigh on admission and record weight daily;Assess signs and symptoms associated with cardiac rhythm changes;Monitor intake/output per unit protocol and/or LIP order;Monitor lab values       Problem: Renal Instability  Goal: Fluid and electrolyte balance are achieved/maintained  Outcome: Progressing   08/30/17 1114   Goal/Interventions addressed this shift   Fluid and electrolyte balance are achieved/maintained  Monitor intake and output every shift;Monitor daily  weight;Provide adequate hydration;Monitor/assess lab values and report abnormal values;Assess and reassess fluid and electrolyte status;Observe for seizure activity and initiate seizure precautions if indicated;Observe for cardiac arrhythmias;Follow fluid restrictions/IV/PO parameters;Monitor for muscle weakness

## 2017-08-30 NOTE — Progress Notes (Signed)
Nutritional Support Services  Nutrition Screening    [name] [name] 72 y.o. male                                 MRN: [id number]    Referral Source: Per policy  Reason for Referral: LOS    Orders Placed This Encounter   Procedures   . Diet Consistent Carbohydrate and Heart Healthy      PO intake: pt reports eating between 75-100% of his meals in the hospital, nursing documentation confirms    Chart review completed.  Pt reports his appetite has been good and he has been eating well in the hospital. Pt reports no recent unintentional wt loss, said his UBW is 206#. Pt reports no nausea and had his first bm in the hospital yesterday. Pt reports he tries to watch his sugar intake at home, but otherwise does not follow a specific diet. Provided pt with Heart Healthy, Consistent Carbohydrate Nutrition Therapy handout. Discussed strategies to follow a low sodium diet and choosing healthy carbohydrate choices. Pt reports he loves [employer] fries, but is willing to give these up. He and his wife are trying to eat healthier and he reports feeling motivated to do so. Labs and meds noted.  No nutrition diagnosis or intervention at this time.  Please consult dietitian if needed.      Rodell Perna, [place]  Clinical Dietitian  [phone]

## 2017-08-30 NOTE — Plan of Care (Signed)
Problem: Moderate/High Fall Risk Score >5  Goal: Patient will remain free of falls   08/30/17 0100   OTHER   High (Greater than 13) HIGH-Activate bed/chair exit alarm where available;HIGH-Apply yellow "Fall Risk" arm band;HIGH-Initiate use of floor mats as appropriate       Problem: Renal Instability  Goal: Fluid and electrolyte balance are achieved/maintained  Outcome: Progressing   08/28/17 1626   Goal/Interventions addressed this shift   Fluid and electrolyte balance are achieved/maintained  Monitor intake and output every shift;Monitor/assess lab values and report abnormal values;Provide adequate hydration;Monitor daily weight;Assess for confusion/personality changes;Assess and reassess fluid and electrolyte status;Observe for seizure activity and initiate seizure precautions if indicated;Observe for cardiac arrhythmias;Monitor for muscle weakness     Goal: Perineal skin integrity is maintained or improved  Outcome: Progressing   08/28/17 1626   Goal/Interventions addressed this shift   Perineal skin integrity is maintained or improved Keep intact skin clean and dry;Apply urinary containment device as appropriate and/or per order;Use protective skin barriers to decrease potential skin breakdown;Consult/collaborate with Wound Care Nurse       Problem: Inadequate Tissue Perfusion-Venous  Goal: Tissue perfusion is adequate-venous  Outcome: Progressing   08/30/17 0145   Goal/Interventions addressed this shift   Tissue perfusion is adequate-venous  Increase activity as tolerated / progressive mobility;Elevate feet when in chair;Teach/review/reinforce ankle pump exercises;VTE prevention: Administer anticoagulant(s) and/or apply anti-embolism stockings/devices as ordered       Problem: Compromised Tissue integrity  Goal: Damaged tissue is healing and protected  Outcome: Progressing   08/30/17 0145   Goal/Interventions addressed this shift   Damaged tissue is healing and protected  Monitor/assess Braden scale every  shift;Provide wound care per wound care algorithm;Reposition patient every 2 hours and as needed unless able to reposition self;Increase activity as tolerated/progressive mobility;Keep intact skin clean and dry;Encourage use of lotion/moisturizer on skin;Monitor patient's hygiene practices

## 2017-08-30 NOTE — Progress Notes (Signed)
Patient for d/c today. Confirmed with [facility]ie/[place] this evening insurance Berkley Harvey has been received, patient has been accepted, and bed is available today. Updated clinical uploaded into [facility] and d/c clinical faxed directly to facility. Wife is present in the room and will transport at d/c (around 6 PM). RN to coordinate d/c. All parties aware of d/c plan.       08/30/17 1715   Discharge Disposition   Patient preference/choice provided? Yes   Physical Discharge Disposition SNF   Receiving facility, unit and room number: [facility], Room 217 P   Nursing report phone number: [phone]   Facility fax number: [phone]   Mode of Transportation Car  (wife will transport at d/c)   Pick up time 6 PM   Patient/Family/POA notified of transfer plan Yes   Patient agreeable to discharge plan/expected d/c date? Yes   Family/POA agreeable to discharge plan/expected d/c date? Yes   Bedside nurse notified of transport plan? Yes   Hard copy of narcotic RX sent with patient? N/A   IV antibiotics post discharge? N/A   Wound care post discharge? N/A   Medicare Checklist   Is this a Medicare patient? No   Patient received 1st IMM Letter? n/a   Medicaid/DMAS   DMAS 95 MI/MR completed and faxed Yes

## 2017-08-30 NOTE — Progress Notes (Signed)
Pulmonary Progress Note  "[facility]"    Date Time: 08/30/17 7:21 AM  Patient Name: [name] [name]  Attending Physician: Marella Chimes, MD      Assessment:   Resolved respiratory failure:    Congestive heart failure exacerbation.    History of asthma and mild bilateral bronchiectasis: stable .    OSA:  scheduled to have CPAP titration as an outpatient  Patient Active Problem List   Diagnosis   . Chest pain   . Diabetes mellitus   . Heart attack   . CAD (coronary artery disease)   . Non-ischemic cardiomyopathy   . Acute exacerbation of CHF (congestive heart failure)   . PUD (peptic ulcer disease)   . Elevated LFTs   . Acute dyspnea   . Elevated d-dimer   . CHF (congestive heart failure)   . Headaches due to old head injury   . Essential hypertension   . Pneumonia due to infectious organism, unspecified laterality, unspecified part of lung   . Syncope and collapse   . AICD (automatic cardioverter/defibrillator) present   . Syncope, unspecified syncope type   . Pneumonia due to infectious organism   . Dyspnea, unspecified type   . Cardiomyopathy   . Cough   . Sepsis   . AKI (acute kidney injury)   . Sepsis, due to unspecified organism       Plan:   To have CPAP titration on 09/09/17  From pulmonary standpoint, patient can be discharged to rehabilitation.  Please taper and stop prednisone over the next 5-7 days    Interval History       Interval History/24 hr. Events:Doing very well and does not have shortness of breath or cough.  I spoke with the nursing staff about his condition.     Physical Exam:   Vital signs for last 24 hours:  Temp:  [97.2 F (36.2 C)-98.4 F (36.9 C)] 98.4 F (36.9 C)  Heart Rate:  [85-102] 85  Resp Rate:  [16-20] 17  BP: (109-135)/(73-98) 109/73    Intake and Output Summary (Last 24 hours) at Date Time    Intake/Output Summary (Last 24 hours) at 08/30/17 0721  Last data filed at 08/29/17 2300   Gross per 24 hour   Intake              120 ml   Output              1425 ml   Net            -1305 ml       General: awake, alert, oriented x 3; no acute distress;  HEENT: PERR; no cervical LN, no thrush.  Cardiovascular: regular rate and rhythm, no murmurs, rubs or gallops.  Lungs: Has unlabored breathing. Reduced air entry, Does not have any wheezing  Abdomen: soft, non-tender, non-distended;  no hepatosplenomegaly,   Extremities: +   edema.  Neuro: Normal sensory and motor systems.         Meds:   Scheduled Meds:  Current Facility-Administered Medications   Medication Dose Route Frequency   . albuterol-ipratropium  3 mL Nebulization Q6H SCH   . amitriptyline  25 mg Oral QHS   . atorvastatin  40 mg Oral QHS   . carvedilol  6.25 mg Oral Q12H SCH   . docusate sodium  100 mg Oral BID   . fluticasone furoate-vilanterol  1 puff Inhalation QAM   . furosemide  40 mg Oral Daily   .  insulin glargine  50 Units Subcutaneous QHS   . insulin lispro  1-4 Units Subcutaneous QHS   . insulin lispro  1-8 Units Subcutaneous TID AC   . lactobacillus/streptococcus  1 capsule Oral Daily   . pantoprazole  40 mg Oral QAM AC   . predniSONE  10 mg Oral BID Meals   . tamsulosin  0.4 mg Oral QD after dinner     Continuous Infusions:  PRN Meds:.acetaminophen **OR** acetaminophen, ALPRAZolam, benzonatate, bisacodyl, calcium carbonate, Nursing communication: Adult Hypoglycemia Treatment Algorithm **AND** dextrose **AND** dextrose **AND** glucagon (rDNA), naloxone, ondansetron, zolpidem      Labs:     Results     Procedure Component Value Units Date/Time    Glucose Whole Blood - POCT [[id number]]  (Abnormal) Collected:  08/29/17 2213     Updated:  08/29/17 2216     POCT - Glucose Whole blood 358 (H) mg/dL     Culture Blood Aerobic and Anaerobic [[id number]] Collected:  08/24/17 1211    Specimen:  Blood, Intravenous Line Updated:  08/29/17 2121    Narrative:       Indications:->Pneumonia  ORDER#: [id number]                                    ORDERED BY: Eddie North  SOURCE: Blood, Intravenous Line c line                COLLECTED:  08/24/17 12:11  ANTIBIOTICS AT COLL.:                                RECEIVED :  08/24/17 19:19  Culture Blood Aerobic and Anaerobic        FINAL       08/29/17 21:21  08/29/17   No growth after 5 days of incubation.      CULTURE + Dierdre Forth [[id number]] Collected:  08/29/17 1057    Specimen:  Sputum, Expectorated Updated:  08/29/17 1840    Narrative:       ORDER#: [id number]                                    ORDERED BY: DUBEY, ADITYA  SOURCE: Sputum, Expectorated Expectorated            COLLECTED:  08/29/17 10:57  ANTIBIOTICS AT COLL.:                                RECEIVED :  08/29/17 17:45  Stain, Gram (Respiratory)                  FINAL       08/29/17 18:40  08/29/17   Rare WBC's             Few Mixed Respiratory Flora             No Squamous epithelial cells  Culture and Gram Stain, Aerobic, RespiratorPENDING      Glucose Whole Blood - POCT [[id number]]  (Abnormal) Collected:  08/29/17 1620     Updated:  08/29/17 1626     POCT - Glucose Whole blood 221 (H) mg/dL     Glucose Whole Blood - POCT [[id number]]  (Abnormal) Collected:  08/29/17 1109  Updated:  08/29/17 1119     POCT - Glucose Whole blood 205 (H) mg/dL     Glucose Whole Blood - POCT [[id number]]  (Abnormal) Collected:  08/29/17 0718     Updated:  08/29/17 0731     POCT - Glucose Whole blood 131 (H) mg/dL           Radiology:     Radiology Results (24 Hour)     ** No results found for the last 24 hours. **            Signed by: Arthur Holms, MD  Date/Time: 08/30/2017 7:21 AM

## 2017-08-30 NOTE — PT Progress Note (Signed)
Physical Therapy Note    [facility]  [address]  [place], [place]. [address]    Department of Rehabilitation  [phone]    Physical Therapy Daily Treatment Note    Patient: [name] [name]    MRN#: [id number]     [id number]/[id number]    Time of Treatment: Start Time: 1622 Stop Time: 1656 Time Calculation (min): 34 min    PT Visit Number: 3    Patient's medical condition is appropriate for Physical Therapy intervention at this time.    Precautions and Contraindications:   Precautions  Other Precautions: fall risk, SOB    Assessment: Pt making progress towards goals,increse ambulation distance w/ less SOB,multiple stops during walk. Pt continues with generalized weakness limiting functional tolerance for ambulation and completing standing activities.Patient will benefit from rehab to maximize functional outcome.  Pt will benefit from continued in-patient PT to address the following deficits:Assessment: Gait impairment;Decreased functional mobility;Decreased endurance/activity tolerance;Decreased LE strength    Prognosis: Good;With continued PT status post acute discharge   Progress: Progressing toward goals     Patient Goal: "to get my strength back"      Plan:   Continue with Physical Therapy services to address dynamic standing,mobility deficits,strength Focus next session on gait,stairs  Treatment/Interventions: Gait training;Neuromuscular re-education;Functional transfer training;LE strengthening/ROM;Endurance training;Stair training;Bed mobility   PT Frequency: 4-5x/wk     Based on today's session patient's discharge recommendation is the following: Discharge Recommendation: Acute Rehab If Discharge Recommendation: Acute Rehab is not available, then the patient will need SNF.  DME Recommended for Discharge:  (TBD post-rehab)          Subjective: Patient is agreeable to participation in the therapy session. Nursing clears patient for therapy. Patient's wife present at bedside for duration of  session and for all education provided. Denies any c/o pain today.    Objective:  Observation of Patient/Vital Signs:  Patient is in bed with dressings and peripheral IV in place.    Cognition/Neuro Status  Arousal/Alertness: Appropriate responses to stimuli  Attention Span: Appears intact  Orientation Level: Oriented to person;Oriented to place;Disoriented to time  Memory: Appears intact  Following Commands: Follows one step commands without difficulty  Safety Awareness: minimal verbal instruction  Insights: Educated in Engineer, building services  Problem Solving: Assistance required to identify errors made;supervision  Behavior: attentive;calm;cooperative  Motor Planning: intact  Coordination: intact  Hand Dominance: right handed    Functional Mobility  Supine to Sit: Supervision;HOB raised;to Right  Scooting to EOB: Supervision  Sit to Supine: Stand by Assist;Supervision to Left  Sit to Stand: bed elevated;with instruction for hand placement to increase safety;Contact Guard Assist (bed ht to simulate home enviroment)  Stand to Sit: Stand by Assist;Contact Guard Assist (verbal for safe hand placement,turn completely around ,keep RW w/ self until ready to sit down.)   Transfers  Posterior Scoot Transfers: Supervision  Device Used for Functional Transfer: front-wheeled walker  Locomotion  Ambulation: with front-wheeled walker;Minimal Assist;Contact Guard Assist  Pattern: decreased cadence;decreased step length;Step through (forward flexion of upper trunk)  Distance Walked (ft) (Step 6,7): 200 Feet (several rest breaks required with SOB )   Verbal instruction for step through sequencing to include correct RW placement with advancement of bilateral LE and proper use of both arms to help compensate for LE weakness and unsteady gait.Facilitated postural alignment with initial standing to ensure upright posture with shoulder and hip alignment. Educated patient on the importance of coming to a complete stand and establishing  upright posture prior  to attempting ambulation or transfers. Facilitated lateral weight shifting through hip and pelvis to facilitate natural postural adjustments during gait.         Therapeutic Exercise  Hip Flexion: seated: marches x 10,B  Hip Abduction: seated: Hip add w/ pillow x 10  Knee AROM : seated: Bx10  Ankle Pumps: seated: Bx15  Strengthening: patient education   Pt was instructed in there ex per above with focus and facilitation on correct positioning and cadence to maximize quality of each exercise. Pt was instructed to perform ex's 2x a day, 10-20 reps each for strengthening.          Neuro Re-Ed  Sitting Balance: supervision;without support;without instruction (@ EOB,performiing seated TE,adjusting clothing prior to walk)  Standing Balance: dynamic gait training;with instruction;with support;contact guard assist;stand by assist;patient education;variable environment/backgrounds (w/ Rw)     Treatment Activities: Patient was instructed in therapeutic activities per above for out of bed and gait training as well as continuous ankle pumps, seated march, long arc quads and hip abduction slides (with 2 second hold per rep, 2 X 10-20 reps) for LE strengthening with focus and facilitation on correct LE positioning and cadence to maximize quality of each exercise . Educated patient in pursed lip breathing technique with focusing on taking deep breath in through their nose and exhaling through their mouth. Instructed patient on full inhale for a count of 3-5 and a full exhale for 5-8 seconds . Instructed patient on the importance of pushing carbon dioxide from lung bases which will allow for increase oxygen intake. Advised patient to continue with pursed lip breathing until SOB or decreased oxygen saturations resolves. Instructed in proper use of spirometer(10x per hour or 2-3x every 6-10 minutes) to help maximize pulmonary hygiene and advised patient sit at EOB or OOB frequently through the day to ensure the  usage of the lower lobes of the lungs.Encouraged to perform LE therex throughout the day to decrease effects of immobility. Encouraged to sit OOB as tolerated for pulmonary hygiene. Instructed not to get up without assistance for safety.     Educated the patient to role of physical therapy, plan of care, goals of therapy and HEP, safety with mobility and ADLs, pursed lip breathing, home safety.    Patient left without needs and call bell within reach. bed Alarm set.  RN notified of session outcome.      Trude Mcburney  PM&R  [phone]

## 2017-08-30 NOTE — Progress Notes (Signed)
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    No SOB  No Chest pain  No Abdominal pain  No Nausea/Vomiting  No Diarrhea      Objective:   Vital signs in last 24 hours:  Temp:  [97.3 F (36.3 C)-98.4 F (36.9 C)] 98.2 F (36.8 C)  Heart Rate:  [85-102] 85  Resp Rate:  [16-20] 18  BP: (109-134)/(73-98) 130/89     General:    In NAD    HEENT:   Conjunctiva pale, Dry mucosa          CVS:  RRR, no rub        Chest:  CTA/Bilaterally  Abdomen:   Soft, nontender           Ext.:   No edema      Intake/Output:   Intake/Output last 24 hours:    Intake/Output Summary (Last 24 hours) at 08/30/17 1302  Last data filed at 08/30/17 1121   Gross per 24 hour   Intake              360 ml   Output             1975 ml   Net            -1615 ml     Intake/Output this shift:  I/O this shift:  In: 360 [P.O.:360]  Out: 550 [Urine:550]      Labs:       Recent Labs  Lab 08/29/17  0540 08/28/17  0340 08/27/17  0425 08/26/17  1552 08/26/17  0812 08/26/17  0142 08/25/17  2001 08/25/17  1420 08/25/17  0805   Sodium 138 137 137 135* 138 137 135* 137 136   Potassium 4.6 4.9 4.7 4.6 3.5 4.0 4.3 4.4 4.2   Chloride  105 103 103 101 102 101 101 101 101   CO2 29 26 29 28  30* 23 25 23 26    BUN 26.4 28.1* 25.7 22.6 17.9 23.5 26.8 30.7* 35.1*   Creatinine 1.2 1.3 1.4* 1.5* 1.2 1.5* 1.7* 1.9* 2.2*   Glucose 176* 267* 257* 237* 137* 245* 266* 270* 202*   Calcium 8.8 8.8 8.9 9.4 10.0 9.5 9.7 9.4 9.4   Magnesium 2.0 1.7 2.1 1.9 1.7 1.7 1.7 1.7 1.8   Phosphorus  --   --   --   --  2.9  3.0 3.3 3.6 4.4   EGFR >60.0 >60.0 >60.0 55.9 >60.0 55.9 48.4 42.5 35.9       Recent Labs  Lab 08/29/17  0540 08/28/17  0340 08/27/17  0425   WBC 15.54* 12.01* 11.56*   Hgb 13.7 13.4 12.4*   Hematocrit 41.4* 40.5* 37.9*   Platelets 148 139* 137*                   Medications:   Scheduled Meds:       albuterol-ipratropium 3 mL Nebulization Q6H SCH   amitriptyline 25 mg Oral QHS   atorvastatin 40 mg Oral QHS   carvedilol 6.25 mg Oral Q12H SCH   docusate sodium 100 mg Oral BID   fluticasone furoate-vilanterol 1 puff Inhalation QAM   furosemide 40 mg Oral Daily   insulin glargine 50 Units Subcutaneous QHS   insulin lispro 1-4 Units Subcutaneous QHS   insulin lispro 1-8 Units Subcutaneous TID AC   lactobacillus/streptococcus 1 capsule Oral Daily   pantoprazole 40 mg Oral QAM AC   predniSONE 10 mg Oral BID Meals   tamsulosin 0.4 mg Oral QD after dinner         Continuous Infusions:        PRN Meds:acetaminophen **OR** acetaminophen, ALPRAZolam, benzonatate, bisacodyl, calcium carbonate, Nursing communication: Adult Hypoglycemia Treatment Algorithm **AND** dextrose **AND** dextrose **AND** glucagon (rDNA), naloxone, ondansetron, zolpidem

## 2017-08-30 NOTE — Discharge Instr - AVS First Page (Addendum)
Reason for your Hospital Admission:  Heart failure   Renal dysfunction       Instructions for after your discharge:  Follow with cardiology and nephrology as outpt

## 2017-08-30 NOTE — Discharge Summary (Addendum)
[name] HOSPITALIST   [place] Summary   Patient Info:   Date/Time: 08/30/2017 / 5:10 PM   Admit Date:08/22/2017  Patient Name:[name] [name]   ZOX:[id number]   PCP: Zorita Pang, MD  Attending Physician:Dougles Kimmey, Donnita [place], MD     Hospital Course:   Please see H&P for complete details of HPI and ROS. The patient was admitted to [facility] and has been diagnosed with Hospital Problems:  Principal Problem:    AKI (acute kidney injury)  Active Problems:    Sepsis    Sepsis, due to unspecified organism   and has been taken care as mentioned below.     71 y.o. male with PMHx significant for CAD, CHF, DM, HTN who comes in with c/o dyspnea for past 4 days. Pt reports he is dyspneic with minimal exertion .He was admitted to ICU on 11/04/ 2018 for acute respiratory failure secondary to presumed asthma flare up and bronchiectasis and acute systolic congestive heart failure      Acute respiratory failure with altered mental status/SIRS/hypoxia--presumed  secondary to asthma flareup and acute systolic congestive heart failure.  Patient does not have any evidence of chronic obstructive pulmonary disease, never been a smoker.  Pro-calcitonin, borderline.  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  Treated with IV Solu-Medrol, duo nebulizers, Breo .  Hypoxia, improved significantly, and required  Lasix ,CRRT X 1 as recommended by nephrology.  He was stabilized, transferred to medical floor on 08/28/17.   Treated with empiric IV antibiotic Zosyn for acute bronchitis/Asthma flare up .  WBC trended down.  Lactic acidosis, resolved.  As per pulmonology recommendation, tapered steroids to by mouth prednisone, no need of antibiotics on discharge   Acute systolic congestive heart failure, noted to have elevated BNP 1800 --resolved with  Lasix ,CRRT X 1 ,cardiology recommended to continue Coreg has had history of cardiomyopathy.   Cardiology/nephrologist recommends to  discharge  home on Lasix. ,d/c  exforge   Acute kidney injury--most likely secondary to hemodynamic instability and contrast-induced nephropathy, treated with CRRT 11/6-11/8  while ICU. Patient improved significantly, currently nonoliguric, creatinine stabilized, metabolic acidosis, improved, severe hypokalemia resolved.  Dialysis catheter and central line removed.  Nephrology recommended to discharge home on 40 mg by mouth daily, Lasix medication   Severe metabolic acidosis, most likely a combination of renal failure with systemic inflammatory S Conn syndrome--improved significantly   Severe obstructive sleep apnea, recommended to follow-up with PCP as outpatient   History of diabetes mellitus--continue sliding scale insulin as well as Lantus   Hypomagnesemia, resolved with repletion    PT, OT recommended acute rehabilitation  Case manager working on placement  Patient is awake, alert, oriented 3, not in acute distress, medically stable to be discharged to rehabilitation facility    Disposition: snf   Condition at Discharge and Prognosis: stable   Admission Date:08/22/2017  Discharge Date: 08/30/17  Type of Admission:Inpatient  Medical Necessity for stay:AKI   Code Status: Full Code  Subjective at the time of discharge:   Wanted to go to rehab   Chief Complaint:  Shortness of Breath; Abdominal Pain; and Chest Pain    Objective:     Vitals:    08/30/17 0631 08/30/17 0812 08/30/17 0825 08/30/17 1330   BP: 109/73  130/89 130/87   Pulse: 85  85 90   Resp: 17  18    Temp: 98.4 F (36.9 C)  98.2 F (36.8 C) 98.4 F (  36.9 C)   TempSrc: Oral  Oral Oral   SpO2: 99% 95% 99% 97%   Weight: 104 kg (229 lb 4.8 oz)      Height:         Physical Exam:   Patient is awake, alert, oriented 3, not in acute distress.  HEENT pale conjunctiva noted to have dry mucosa.  Chest clear to auscultation bilaterally  CVS S1, S2 present.  No murmurs, rubs or gallops.  Abdomen soft, nontender, no  organomegaly.  Bowel sounds present  Clinical Presentation:   History of Presenting Illness: Please refer to HPI in the Detailed H&P  Discharge Medications:   Discharge Medications:     Discharge Medication List      Taking    amitriptyline 25 MG tablet  Commonly known as:  ELAVIL  amitriptyline 25 mg tablet  Take 1 tablet every day by oral route.     atorvastatin 40 MG tablet  Dose:  40 mg  What changed:  See the new instructions.  Commonly known as:  LIPITOR  Take 1 tablet (40 mg total) by mouth nightly.     bisacodyl 5 MG EC tablet  Dose:  10 mg  Commonly known as:  DULCOLAX  Take 2 tablets (10 mg total) by mouth daily as needed for Constipation.     carvedilol 6.25 MG tablet  Dose:  6.25 mg  What changed:   medication strength   See the new instructions.  Commonly known as:  COREG  Take 1 tablet (6.25 mg total) by mouth every 12 (twelve) hours.     esomeprazole 40 MG capsule  Commonly known as:  NexIUM     fluticasone-salmeterol 100-50 MCG/DOSE Aepb  Dose:  1 puff  Commonly known as:  ADVAIR DISKUS  Inhale 1 puff into the lungs 2 (two) times daily.     furosemide 40 MG tablet  Dose:  40 mg  Commonly known as:  LASIX  Start taking on:  08/31/2017  Take 1 tablet (40 mg total) by mouth daily.     glucose blood test strip  Dose:  1 each  Commonly known as:  ONE TOUCH ULTRA TEST  1 each by Other route 2 (two) times daily.Use as instructed     hydrocodone-chlorpheniramine 10-8 MG/5ML suspension  Dose:  5 mL  Commonly known as:  TUSSIONEX  Take 5 mLs by mouth 2 (two) times daily.     insulin glargine 100 UNIT/ML injection  Dose:  50 Units  Commonly known as:  LANTUS  Inject 50 Units into the skin nightly.  Replaces:  insulin glargine 100 UNIT/ML injection pen     niacin 500 MG CR tablet  Commonly known as:  NIASPAN  niacin ER 500 mg tablet,extended release 24 hr     predniSONE 10 MG tablet  Commonly known as:  DELTASONE  Prednisone 20 mg po q day ,taper decreasing by 10 mg every 3 days until its off     SYMBICORT  80-4.5 MCG/ACT inhaler  Generic drug:  budesonide-formoterol     tamsulosin 0.4 MG Caps  Dose:  0.4 mg  Commonly known as:  FLOMAX  Take 1 capsule (0.4 mg total) by mouth Daily after dinner.        STOP taking these medications    AMBIEN 10 MG tablet  Generic drug:  zolpidem     amlodipine-valsartan 10-320 MG per tablet  Commonly known as:  EXFORGE     aspirin 81 MG chewable tablet  insulin glargine 100 UNIT/ML injection pen  Replaced by:  insulin glargine 100 UNIT/ML injection     JANUMET 50-1000 MG tablet  Generic drug:  SITagliptin-metFORMIN          Follow up recommendations:   Follow up:   Follow-up Information     Zorita Pang, MD Follow up in 1 week(s).    Specialty:  Family Medicine  Contact information:  [phone]  [address]  [place] [address]  [phone]             Eddie North, MD Follow up in 10 day(s).    Specialty:  Nephrology  Contact information:  [address]  [address]  [place] [address]  [phone]                  Results of Labs/imaging:   Labs have been reviewed:   Coagulation Profile:     Recent Labs  Lab 08/26/17  0812   PT 16.9*   PT INR 1.4*   PTT 32       CBC review:   Recent Labs  Lab 08/29/17  0540 08/28/17  0340 08/27/17  0425 08/26/17  0145 08/25/17  1420  08/24/17  0419   WBC 15.54* 12.01* 11.56* 12.40* 14.92* More results in Results Review 18.17*   Hgb 13.7 13.4 12.4* 12.7* 13.7 More results in Results Review 13.4   Hematocrit 41.4* 40.5* 37.9* 38.9* 41.9* More results in Results Review 42.2   Platelets 148 139* 137* 130* 150 More results in Results Review 160   MCV 92.8 92.7 91.3 94.0 93.1 More results in Results Review 95.5   RDW 15 15 15 15 15  More results in Results Review 15   Neutrophils  --   --   --  82.7  --   --  66.3   Lymphocytes Automated  --   --   --  10.3  --   --  27.8   Eosinophils Automated  --   --   --  0.1  --   --  0.0   Immature Granulocyte  --   --   --  1.0  --   --  0.8   Neutrophils Absolute  --   --   --  10.25*  --   --  12.06*   Absolute  Immature Granulocyte  --   --   --  0.13*  --   --  0.14*   More results in Results Review = values in this interval not displayed.  Chem Review:  Recent Labs  Lab 08/29/17  0540 08/28/17  0340 08/27/17  0425 08/26/17  1552 08/26/17  0812 08/26/17  0142 08/25/17  2001 08/25/17  1420 08/25/17  0805  08/24/17  0748   Sodium 138 137 137 135* 138 137 135* 137 136 More results in Results Review 132*   Potassium 4.6 4.9 4.7 4.6 3.5 4.0 4.3 4.4 4.2 More results in Results Review 7.0*   Chloride 105 103 103 101 102 101 101 101 101 More results in Results Review 104   CO2 29 26 29 28  30* 23 25 23 26  More results in Results Review 12*   BUN 26.4 28.1* 25.7 22.6 17.9 23.5 26.8 30.7* 35.1* More results in Results Review 54.4*   Creatinine 1.2 1.3 1.4* 1.5* 1.2 1.5* 1.7* 1.9* 2.2* More results in Results Review 3.7*   Glucose 176* 267* 257* 237* 137* 245* 266* 270* 202* More results in Results Review 188*  Calcium 8.8 8.8 8.9 9.4 10.0 9.5 9.7 9.4 9.4 More results in Results Review 9.0   Magnesium 2.0 1.7 2.1 1.9 1.7 1.7 1.7 1.7 1.8 More results in Results Review  --    Phosphorus  --   --   --   --  2.9 3.0 3.3 3.6 4.4 More results in Results Review  --    Bilirubin, Total  --   --   --   --   --   --   --   --   --   --  0.6   AST (SGOT)  --   --   --   --   --   --   --   --   --   --  181*   ALT  --   --   --   --   --   --   --   --   --   --  214*   Alkaline Phosphatase  --   --   --   --   --   --   --   --   --   --  91   More results in Results Review = values in this interval not displayed.  Results     Procedure Component Value Units Date/Time    Glucose Whole Blood - POCT [[id number]]  (Abnormal) Collected:  08/30/17 1613     Updated:  08/30/17 1618     POCT - Glucose Whole blood 309 (H) mg/dL     CULTURE + Dierdre Forth [[id number]] Collected:  08/29/17 1057    Specimen:  Sputum, Expectorated Updated:  08/30/17 1534    Narrative:       ORDER#: [id number]                                    ORDERED BY:  DUBEY, ADITYA  SOURCE: Sputum, Expectorated Expectorated            COLLECTED:  08/29/17 10:57  ANTIBIOTICS AT COLL.:                                RECEIVED :  08/29/17 17:45  Stain, Gram (Respiratory)                  FINAL       08/29/17 18:40  08/29/17   Rare WBC's             Few Mixed Respiratory Flora             No Squamous epithelial cells  Culture and Gram Stain, Aerobic, RespiratorPRELIM      08/30/17 15:34  08/30/17   Heavy growth of mixed upper respiratory flora             Further report to follow      Glucose Whole Blood - POCT [[id number]]  (Abnormal) Collected:  08/30/17 1154     Updated:  08/30/17 1159     POCT - Glucose Whole blood 232 (H) mg/dL     Glucose Whole Blood - POCT [[id number]]  (Abnormal) Collected:  08/30/17 0752     Updated:  08/30/17 0754     POCT - Glucose Whole blood 150 (H) mg/dL     Glucose Whole Blood - POCT [[id number]]  (Abnormal) Collected:  08/29/17 2213  Updated:  08/29/17 2216     POCT - Glucose Whole blood 358 (H) mg/dL     Culture Blood Aerobic and Anaerobic [[id number]] Collected:  08/24/17 1211    Specimen:  Blood, Intravenous Line Updated:  08/29/17 2121    Narrative:       Indications:->Pneumonia  ORDER#: [id number]                                    ORDERED BY: Eddie North  SOURCE: Blood, Intravenous Line c line               COLLECTED:  08/24/17 12:11  ANTIBIOTICS AT COLL.:                                RECEIVED :  08/24/17 19:19  Culture Blood Aerobic and Anaerobic        FINAL       08/29/17 21:21  08/29/17   No growth after 5 days of incubation.          Radiology reports have been reviewed:  Radiology Results (24 Hour)     ** No results found for the last 24 hours. **        Ct Chest Wo Contrast    Result Date: 08/22/2017  CLINICAL HISTORY: 71 year old man with shortness of breath, chest discomfort. COMPARISON: CT abdomen/pelvis 08/12/2014. TECHNIQUE: CT chest without contrast. The following dose reduction techniques were utilized: automated exposure control  and/or adjustment of the mA and/or kV according to patient size, and the use of iterative reconstructive technique FINDINGS: Chest CT: Heart size enlarged with pacemaker. Aorta normal in size. Coronary artery calcification. Small foci of air right neck, right atrium and right ventricle likely due to recent IV placement. Suspect small supraclavicular lymph nodes limited without IV contrast. Numerous prominent mediastinal, bilateral axillary lymph nodes largest in a precarinal location measuring 1.3 cm. No pericardial fluid. Stable small layering bilateral pleural effusions with overlying atelectasis.  Vascularity is normal. Left upper lobe subpleural triangular 3 mm pulmonary nodule with no suspicious nodularity. No pneumothorax. No bronchiectasis. Central airways are patent. No suspicious bone lesion.      1. Stable small layering pleural effusions with overlying atelectasis. 2. Numerous prominent mediastinal, axillary and suspect supraclavicular lymph nodes. Attention on follow-up. 3. Subpleural triangular benign-appearing left upper lobe nodule. In a low risk patient no further workup is needed. Mills Koller, MD 08/22/2017 2:24 PM    Ct Abd/pelvis With Iv Contrast Only    Result Date: 08/22/2017  CLINICAL HISTORY: 71 year old man with abdominal pain. COMPARISON: CT abdomen/pelvis 08/12/2014. TECHNIQUE: CT abdomen and pelvis following 100 cc of IV Omnipaque 350. The following dose reduction techniques were utilized: automated exposure control and/or adjustment of the mA and/or kV according to patient size, and the use of iterative reconstructive technique FINDINGS: There is reflux of contrast into the hepatic veins from the right atrium, consistent with right heart disease. No focal liver lesion or ductal dilatation. Spleen, pancreas, adrenal glands unremarkable. No calcified stones within the gallbladder. There is fluid along the gallbladder though there is scattered ascites throughout the abdomen. Kidneys  enhance symmetrically without hydronephrosis with numerous bilateral cysts. Largest cyst off the left kidney midpole measures 7.7 x 5.5 cm and largest off the right kidney upper pole measures 6.0 x 5.3 cm. Bowel is mostly collapsed without obstruction  with no localizing inflammatory changes. Appendix not distinctly identified. Bladder not distended. Fat-containing inguinal hernias. No bulky adenopathy appreciated with small retroperitoneal and pelvic sidewall lymph nodes. No free air or localized abscess. Prostate mildly enlarged. Stable right lower chest subcutaneous 8mm nodule. Aorta and iliac arteries are normal in size. There is flow in the celiac trunk, SMA, IMA. Portal vein not opacified due to timing of injection. There is no free air or abscess. No suspicious bone lesion with severe degenerative changes lumbar spine including air from a vacuum disc L5-S1. Subcutaneous edema lower back.      1. Small amount of scattered ascites with no localized inflammation. No bowel obstruction. 2. 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 3. Numerous bilateral renal cysts. Mills Koller, MD 08/22/2017 2:45 PM    Xr Chest Ap Portable    Result Date: 08/28/2017  HISTORY: Pneumonia. COMPARISON: Examination 4 days ago. FINDINGS: Single portable view of the chest was obtained. Perihilar and basilar predominant airspace opacities have resolved. The lungs are clear. The heart remains enlarged with an AICD in place. Right internal jugular central venous catheters are unchanged. There is no pneumothorax.      Clear lungs. Elizebeth Koller, MD 08/28/2017 11:20 AM    Xr Chest Ap Portable    Result Date: 08/24/2017  Indication: Verify placement of triple lumen catheter and temporary dialysis catheter. Procedure: Portable AP view of the chest. Comparison: Prior exam earlier on November 6. Findings: New right internal jugular central lines with tips in the region of the caval atrial junction. No visible  pneumothorax. Left-sided defibrillator generator with leads extending into the right atrium, right ventricle, and coronary sinus. Moderate cardiomegaly. Unchanged predominant perihilar and inferior lung opacities, nonspecific but likely due to pulmonary edema and atelectasis. Small effusions may be present layering posteriorly because of supine positioning.      New right-sided central lines. Wynema Birch, MD 08/24/2017 12:58 PM    Xr Chest Ap Portable    Result Date: 08/24/2017  Indication: CHF. Acute kidney injury. Procedure: Portable AP view of the chest. Comparison: Prior exam October 5. Findings: Left-sided defibrillator generator with leads extending into the right atrium, right ventricle, and coronary sinus. Moderate cardiomegaly. Small basilar opacities are likely to be foci of atelectasis. Small pleural effusions may be present, layering posteriorly because of supine positioning. Mild evidence of interstitial edema, without significant change. No pneumothorax.      Congestive heart failure. Small pleural effusions are suspected layering posteriorly because of supine positioning. Wynema Birch, MD 08/24/2017 10:16 AM    Xr Chest Ap Portable    Result Date: 08/23/2017  HISTORY: Congestive heart failure. COMPARISON: 08/22/2017 radiographs and CT of the chest FINDINGS:  AP upright radiograph of the chest. There is slight increased hazy opacity in the left lower lung zone, suggestive of increased left posterior layering pleural effusion. Mild right lung base atelectasis also noted. No frank infiltrate or significant interstitial edema. No pneumothorax. Small lung volumes. Unchanged cardiomegaly. No pneumothorax. Cardiac pacer/defibrillator with 3 intravenous leads, intact.     1.  Slightly increased hazy opacity in the left lower lung zone, suggestive of increased left posterior layering pleural effusion. 2.  Mild right lung base atelectasis. 3.  Small lung volumes. No frank infiltrate or significant  interstitial edema. 4.  Unchanged cardiomegaly. Candi Leash, MD 08/23/2017 8:27 PM    Xr Chest Ap Portable    Result Date: 08/22/2017  HISTORY: Chest pain. COMPARISON:  Chest exam 12/12/2016. FINDINGS: Single portable view of the chest was obtained. Left-sided AICD remains in place with intact leads. The heart is mildly enlarged, which may be due to positioning and/or low lung volumes. There are patchy right basilar pulmonary opacities projecting over the heart border. No large pleural effusion or pneumothorax is evident.      Right basilar airspace opacities indicative of atelectasis or pneumonia in the correct clinical setting. Radiographic follow-up to full resolution recommended. Elizebeth Koller, MD 08/22/2017 12:00 PM    Non-tunneled Cath Placement (tdc)    Result Date: 08/24/2017  HISTORY: Acute renal failure, requiring access for hemodialysis. Temporary hemodialysis access catheter placement performed under ultrasound guidance:: PROCEDURE:  The nature of the procedure, risks, benefits, and alternatives were discussed with the patient. All questions were answered and consent was obtained. Ultrasound was performed to assess the jugular veins prior to catheter placement. The right internal jugular vein is normally compressible .    The procedure was performed using standard sterile technique. The intended puncture site was infiltrated with local anesthetic injected subcutaneously.  Puncture of the vein was performed at bedside under direct sonographic guidance with a 21 gauge single wall needle. Sonographic images were recorded pre- and post- puncture for documentation.  A 0.018 inch guidewire was advanced into the vessel. The needle was removed and a 5 French coaxial dilator system was placed. The outer dilator was exchanged over a 3 mm J-wire for series of fascial dilators to accommodate a temporary dialysis catheter. The catheter was then placed.  Excellent blood return and fluid infusion was confirmed. The lumina  were flushed and capped. The catheter was secured with 0 Prolene suture material and a sterile dressing was applied.       Successful placement of a 16 cm temporary hemodialysis access catheter via right internal jugular venous approach under ultrasound guidance.  A chest x-ray will be obtained to assess tip position.   Lemar Livings, MD 08/24/2017 4:11 PM    Triple Lumen Cath (tlc)    Result Date: 08/24/2017  HISTORY: Urgent need for central venous access.  Requires intravenous access for fluids. Triple-lumen central venous catheter placement performed under ultrasound guidance:: PROCEDURE:  The nature of the procedure, risks, benefits, and alternatives were discussed with the patient.  All questions were answered and consent was obtained. Ultrasound was performed to assess the jugular veins prior to catheter placement. The right internal jugular vein is normally compressible .    The procedure was performed using standard sterile technique. The intended puncture site was infiltrated with local anesthetic injected subcutaneously.  Puncture of the vein was performed at bedside under direct sonographic guidance with a 21 gauge single wall needle. Sonographic images were obtained pre- and post- puncture for documentation.  A 0.018 inch guidewire was advanced into the vessel. The needle was removed and a 5 French coaxial dilator system was placed. The outer dilator was exchanged over a 3 mm J-wire for a 7 French dilator to accommodate a triple lumen central venous access catheter. The catheter was then placed.  Excellent blood return and fluid infusion was confirmed.  The lumina were flushed and capped. The catheter was secured with two 0-silk  sutures and a sterile dressing was applied.      Successful placement of a 7 [place] triple lumen central venous access catheter via right internal jugular  approach under ultrasound guidance.  A chest x-ray will be obtained to assess tip position.  Lemar Livings, MD 08/24/2017  4:12  PM    Pathology:   Specimens     None        Pending Lab Results:   Labs/Images to be followed at your PCP office:   Unresulted Labs     None         Hospitalist:   Signed by: Marella Chimes  08/30/2017 5:10 PM  Time spent for discharge: 32  minutes      *This note was generated by the Epic EMR system/ Dragon speech recognition and may contain inherent errors or omissions not intended by the user. Grammatical errors, random word insertions, deletions, pronoun errors and incomplete sentences are occasional consequences of this technology due to software limitations. Not all errors are caught or corrected. If there are questions or concerns about the content of this note or information contained within the body of this dictation they should be addressed directly with the author for clarification

## 2017-08-30 NOTE — OT Eval Note (Signed)
[facility]  [address]  [place], [place]. [address]    Department of Rehabilitation Services  [phone]    Occupational Therapy Evaluation    Patient: [name] [name]    MRN#: [id number]     [id number]    Time of treatment: Time Calculation  OT Received On: 08/30/17  Start Time: 1418  Stop Time: 1457  Time Calculation (min): 39 min  OT Visit Number: 1    Consult received for [name] [name] for OT Evaluation and Treatment.  Patient's medical condition is appropriate for Occupational therapy intervention at this time.    Assessment:   [name] [name] is a 71 y.o. male admitted 08/22/2017.   Brief chart review completed including review of labs, review of imaging, review of vitals, time spent in ICU and review of H&P and physician progress notes.  Pt's ability to complete ADLs and functional transfers is impaired due to the following deficits:  decreased activity tolerance, decreased balance, decreased insight, decreased memory, decreased safety awareness and decreased strength.  Pt demonstrates performance deficits with grooming, bathing, dressing, toileting and functional mobility. There are a few comorbidities or other factors that affect plan of care and require modification of task including: has stairs to manage and work demands.  Pt would continue to benefit from OT to address these deficits and increase functional independence.    Assessment: decreased strength;balance deficits;decreased independence with ADLs;decreased safety awareness;decreased cognition;decreased independence with IADLs     Complexity Chart Review Performance Deficits Clinical Decision Making Hx/Comorbidities Assistance needed   Low Brief 1-3 Limited options None None (or at baseline)   Moderate Expanded 3-5 Several Options 1-2 Min/Mod assist (not at baseline)   High Extensive 5 or more Multiple options 3 or more Max/dependent (not at baseline     Therapy Diagnosis: decreased functional mobility ,  decreased independence with ADL's and decreased safety awareness due to current illness. Without therapy interventions, patient is at risk for falls, dependence on caregivers for ADL's, decreased independence and failure to return to PLOF.    Rehabilitation Potential: Prognosis: Good;With continued OT s/p acute discharge      Plan:   OT Frequency Recommended: 3-4x/wk   Treatment Interventions: ADL retraining;Functional transfer training;UE strengthening/ROM;Patient/Family training;Equipment eval/education     Patient Goal  Patient Goal: To go to rehab    Risks/Benefits/POC Discussed with Pt/Family: With patient/family    Goals:   Goal Formulation: Patient;Family  Time For Goal Achievement: by time of discharge  ADL Goals  Patient will groom self: Supervision;at sinkside;5 visits  Patient will toilet: Supervision;5 visits  Mobility and Transfer Goals  Pt will transfer bed to toilet: Supervision;with rolling walker;5 visits                         Discharge Recommendations:   Based on today's session patient's discharge recommendation is the following: Discharge Recommendation: SNF.     If Discharge Recommendation: SNF is not available, then the patient will need home health services, 24/7 supervision, assistance with mobility and assistance with ADL's for safety due to current balance deficits impacting functional mobility.           Precautions and Contraindications: Falls Risk         Medical Diagnosis: Acute respiratory insufficiency [R06.89]  Sepsis, due to unspecified organism [A41.9]  Pneumonia of right lower lobe due to infectious organism [J18.1]    History of Present Illness: [name] [name] is a 71 y.o. male admitted  on 08/22/2017 with "Hospital course  11/04 [facility]-- Sepsis/ PNA (wbc=10.27, lactic=3?4,   cxr= Rt  basilar airspace opacities)  , CHF (bnp=1828)  w/ resp distress  ~ Lasix , IV bolus given , AKI (bun=25, cr=1.8)   . 11/4 CT abd & pelvis: Small amt  scattered ascites w/  no localized  inflammation. No bowel obstruction. Rt heart disease w/  reflux of contrast into the hepatic veins. Numerous bilateral renal cysts.  11/6  ICU  worsening Renal Failure (bun=60, cr=4.1) , Elevated LFT's (ast= 181alt=-214)   Repeat BC  x 1 set.  CRRT started   11/8 CRRT stopped" -- as per Dr. Marigene Ehlers note.    Patient Active Problem List   Diagnosis   . Chest pain   . Diabetes mellitus   . Heart attack   . CAD (coronary artery disease)   . Non-ischemic cardiomyopathy   . Acute exacerbation of CHF (congestive heart failure)   . PUD (peptic ulcer disease)   . Elevated LFTs   . Acute dyspnea   . Elevated d-dimer   . CHF (congestive heart failure)   . Headaches due to old head injury   . Essential hypertension   . Pneumonia due to infectious organism, unspecified laterality, unspecified part of lung   . Syncope and collapse   . AICD (automatic cardioverter/defibrillator) present   . Syncope, unspecified syncope type   . Pneumonia due to infectious organism   . Dyspnea, unspecified type   . Cardiomyopathy   . Cough   . Sepsis   . AKI (acute kidney injury)   . Sepsis, due to unspecified organism        Past Medical/Surgical History:  Past Medical History:   Diagnosis Date   . AICD (automatic cardioverter/defibrillator) present 2014   . CAD (coronary artery disease)    . Cardiac defibrillator in situ    . CHF (congestive heart failure) 08/12/2014   . Diabetes mellitus    . Heart attack 2011    and 2014   . Hyperlipemia    . Hypertension    . Ischemic cardiomyopathy     20%   . Pneumonia 09/2016      Past Surgical History:   Procedure Laterality Date   . CARDIAC CATHETERIZATION     . CARDIAC DEFIBRILLATOR PLACEMENT     . CARDIAC PACEMAKER PLACEMENT     . CIRCUMCISION     . CORONARY STENT PLACEMENT  2011   . duodenal ulcer  1973   . EGD N/A 08/15/2014    Procedure: EGD;  Surgeon: Colon Branch, MD;  [facility];  Service: Gastroenterology;  Laterality: N/A;  egd w/ bx   . HERNIA REPAIR      [facility]   . orthopedic  surgery      R foot corrective   . 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 Nursing clears patient for therapy.  Subjective: Patient's wife present at bedside for duration of session and for all education provided.  Pain Assessment  Pain Assessment: No/denies pain.      Objective:   Observation of Patient/Vital Signs:  Patient is in bed with telemetry and peripheral IV in place.    Posture: slightly forward flexed     Cognition/Neuro Status  Arousal/Alertness: Appropriate responses to stimuli  Attention Span: Appears intact  Orientation Level: Oriented X4  Memory: Decreased recall of biographical information (difficulty with recall of home set up)  Following Commands: minimal verbal instruction  Safety Awareness: minimal verbal instruction (regarding safe rw use)  Insights: Decreased awareness of deficits;Educated in safety awareness  Behavior: attentive;calm;cooperative  Motor Planning: intact  Coordination: intact (for serial opposition)  Hand Dominance: right handed    Gross ROM  Right Upper Extremity ROM: within functional limits  Left Upper Extremity ROM: within functional limits  Gross Strength  Right Upper Extremity Strength: 4+/5 (except shoulder 4/5; grip good)  Left Upper Extremity Strength: 4+/5 (except  shoulder 4/5; grip good)        Sensory  Auditory: intact (Per patient report)  Tactile - Light Touch: intact (Per Patient report)  Visual Acuity: intact;wears glasses     Self-care and Home Management  Grooming: Minimal Assist;standing at sink;steadying;verbal prompting;standing with assistive device;wash/dry hands  LB Dressing: Supervision;sitting;edge of bed;Supervision/safety;Don/doff R sock  Toileting: Minimal Assist;standing;steadying;verbal prompting;use of bedpan/urinal setup;clothing management up;clothing management down  Functional Transfers: Minimal Assist;steadying;verbal prompting;toilet transfer (grab bar use; instructions for hand placement)    Mobility and Transfers  Supine to Sit: Supervision;HOB raised  Sit to Supine: Supervision  Sit to Stand: Minimal Assist;with instruction for hand placement to increase safety (from bed)  Bed <--> Toilet: Minimal Assist (w/ rw; step by step verbal instructions once in bathroom for rw mgmt and body alignment)     Balance  Static Sitting Balance: good  Dyanamic Sitting Balance: good  Static Standing Balance: good (w/ rw)  Dynamic Standing Balance: fair (w/ rw)    Participation and Endurance  Participation Effort: good  Endurance: Tolerates 30+ min exercise without fatigue    AM-PACT "6 Clicks" Daily Activity Inpatient Short Form  Inpatient AM-PACT Performed?: yes  Put On/Take Off Lower Body Clothing: A little  Assist with Bathing: A little  Assist with Toileting: A little  Put On/Take Off Upper Body Clothing: A little  Assist with Grooming: A little  Assist with Eating: None  OT Daily Activity Raw Score: 19  CMS 0-100% Score: 42.80%    Treatment Activities: Patient appeared sweaty upon arrival with a soiled/wet gown. Patient denied feeling dizzy/lightheaded, therefore vitals taken, BP 124/84 SpO2 97% and HR 88 bpm. Patient instructed to pace self with transitional movements (supine to sit, sit to stand, etc.), encouraging Patient to wait to notice if he feels  lightheaded or dizzy prior to beginning to stand/walk for fall prevention. Upon approaching toilet, Patient required verbal step-by-step instructions for sequencing side stepping with rw, along with safe body alignment in front of toilet. Patient performed simulated toilet transfer, then reported having to urinate upon standing from toilet, therefore used urinal in standing with Min Assist for steadying and clothing mgmt while at rw. Patient educated on safe rw use in small spaces in bathroom including keeping rw infront of self upon approaching sinkside to engage in grooming tasks for increased safety. Discussed  with Patient and wife home safety recommendations for bathroom use. Recommendations made to use available shower seat with back and grab bars in shower for energy conservation and fall prevention. Also advised Patient to have Supervision with shower transfers and showering upon d/c to ensure safety. Patient educated on importance of OOB sitting, and encouraged Patient to sit up for all meals to increase endurance and strength for activities and for pulmonary hygiene and skin integrity. Patient denied SOB after activity, however appeared to have some SOB while resting in bed at end of session.  Patient denied feeling lightheaded or dizzy during the session. Patient in bed at end of session with all needs within reach. Patient instructed to ring for nursing for all needs and appeared receptive to all education provided. Bed Alarm activated for Patient's safety and RN notified of session outcome.    Educated the patient to role of occupational therapy, plan of care, goals of therapy and safety with mobility and ADLs, home safety.    Jolee Ewing, OTS  This note was reviewed and cosigned by the following Occupational Therapist:   Tennis Ship. Trixie Deis, MS,OTR/L  Pager # [phone]  [phone]

## 2017-08-30 NOTE — Progress Notes (Signed)
After Visit Summary      Hamed E. Vasil ZOX:[id number]   AKI (acute kidney injury)  08/22/2017 - 08/30/2017  [name]  [facility]  [id number]   After Visit Summary   Most Important        About Your Stay       Reason for your Hospital Admission:  Heart failure   Renal dysfunction       Instructions for after your discharge:  Follow with cardiology and nephrology as outpt        Things To Do     Do        Pick up 7 medications from any pharmacy with your printed prescription   Doctor in charge of your hospital stay     Marella Chimes, MD    Phone: [phone]  The following tests are still processing. Please ask your doctor.     Order Current Status   CULTURE + GRAM STAIN,AEROBIC,RESPIRATORY Preliminary result   What's next     What's next    Follow up with Zorita Pang, MD in 1 week(s)  [facility]   [phone]   [address]   [place] [address]   [phone]     Follow up with Eddie North, MD in 10 day(s)  [facility]   [address]   [address]   [place] [address]   [phone]    Medications You Will Be Given     Medications You Will Be Given   [id number] levalbuterol Pauline Aus)   Next due Tuesday December 12 (Overdue)   Expected: 3 (three) times daily    You are allergic to the following     You are allergic to the following   No active allergies   Immunizations Administered for This Admission     No immunizations on file.   Medication List Instructions     Medication Lists help reduce medication errors and help prevent harmful drug interactions. Below is your Discharge Medication List that was reviewed by the physician today. Please maintain and update your medication list and share it with your health care providers at every visit.  Discharge Medication List     STOP taking these medications     STOP taking these medications   amlodipine-valsartan 10-320 MG per tablet   Common Name:  EXFORGE       aspirin 81 MG chewable tablet       insulin glargine 100 UNIT/ML injection pen   Replaced by: insulin glargine 100 UNIT/ML injection       JANUMET 50-1000 MG tablet   Generic drug: SITagliptin-metFORMIN       TAKE these medications     TAKE these medications    Instructions AM Noon PM Bed As Needed   AMBIEN 10 MG tablet   Generic drug: zolpidem     Take 10 mg by mouth nightly as needed for Sleep.              amitriptyline 25 MG tablet   Common Name: ELAVIL     amitriptyline 25 mg tablet Take 1 tablet every day by oral route.              atorvastatin 40 MG tablet   Common Name: LIPITOR   What changed: See the new instructions.     Take 1 tablet (40 mg total) by mouth nightly.  bisacodyl 5 MG EC tablet   Common Name: DULCOLAX     Take 2 tablets (10 mg total) by mouth daily as needed for Constipation.              carvedilol 6.25 MG tablet   Common Name: COREG   What changed:   0 medication strength   0 See the new instructions.     Take 1 tablet (6.25 mg total) by mouth every 12 (twelve) hours.              esomeprazole 40 MG capsule   Common Name: NexIUM                  fluticasone-salmeterol 100-50 MCG/DOSE Aepb   Common Name: ADVAIR DISKUS   For diagnoses: Cough     Inhale 1 puff into the lungs 2 (two) times daily.              furosemide 40 MG tablet   Common Name: LASIX   Start Date: 08/31/2017     Take 1 tablet (40 mg total) by mouth daily.              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              insulin glargine 100 UNIT/ML injection    Common Name: LANTUS   Replaces: insulin glargine 100 UNIT/ML injection pen     Inject 50 Units into the skin nightly.              niacin 500 MG CR tablet   Common Name: NIASPAN     niacin ER 500 mg tablet,extended release 24 hr              predniSONE 10 MG tablet   Common Name: DELTASONE     Prednisone 20 mg po q day ,taper decreasing by 10 mg every 3 days until its off              SYMBICORT 80-4.5 MCG/ACT inhaler   Generic drug: budesonide-formoterol                  tamsulosin 0.4 MG Caps   Common Name: FLOMAX     Take 1 capsule (0.4 mg total) by mouth Daily after dinner.                    Where to pick up your medications        Pick up these medications from any pharmacy with your printed prescription      atorvastatin . bisacodyl . carvedilol . furosemide . insulin glargine . predniSONE . tamsulosin    MyChart Sign Up         MyChart - Take control of your medical records  MyChart allows you immediate, secure and confidential access to your medical records.  Patients like you who use electronic medical record receive care that is:   Safer - More accurate prescriptions and less complications   Faster - Renew prescriptions,  Make appointments   Prevention Focused - Helps physicians to stay on top of preventive   services for you, so that you stay healthy            Sign in using three easy steps: We've started for you!      1. Log on to [email or web address] or [email or web address]  2. Click on Activate Your Account to access the new member sign up page  3. Enter your unique MyChart Access Code exactly as it appears below to complete the sign-up process.  4. If you experience issues with activating your account, the information you have provided does not completely match our records. Please contact your provider's office or any Hospital Registration Department to correct the information required.    MyChart Access Code: [email or web address]  Expires: 09/08/2017  7:30 PM   (If your code has expired, please contact us to receive a new one)     MyChart is NOT to be used for urgent needs. For medical emergencies, dial 911    Questions  If you have questions please refer to the FAQs located on the MyChart home page. You can also call [phone] to talk to our MyChart staff.           Heart Failure Discharge Instructions      Follow Up:           Most likely there are specific follow up instructions listed in another section of these discharge instructions. If we have not already scheduled a follow-up appointment with your physician (Cardiologist or (PCP) Primary Care Physician),  a good rule of thumb is to call your PCP as soon as you are home to make an appointment to be seen within 3 - 5 days of discharge. If you do not have a PCP,please call [phone] for assistance 24 hours a day, 7 days a week.    Activity:               Unless otherwise instructed, gradually increase your activity as tolerated.  Let your symptoms guide you.  If your doctor has not discussed about enrolling you in cardiac rehab, bring it up at your next appointment     Diet:         Follow a heart healthy diet low in saturated fat and cholesterol.   Limit salt (sodium) in your diet to less than 2000 mg daily (1 teaspoon salt = 2400 mg sodium).    Consult with your physician before using a salt substitute, which may be high in potassium.    Fluid restriction:  Limit your daily fluid intake to 1.5 liters or 48 ounces.    Risk Factors:        All patients are advised to not smoke or use any tobacco products.    If you use tobacco products and want help in quitting, call the QuitLine:1-800-QUIT-NOW (684-478-7027).   Know the symptoms of heart failure use the Heart Failure Zone information below to manage symptoms.    Weight Monitoring:     Rapid changes in weigh may reflect retention of fluid, which will worsen your heart failure symptoms.    Weigh yourself each morning before breakfast.      To ensure consistent results, place your scale on a section of bare floor without rug or carpeting.     Record date, time, and weight in your notebook and bring it with you to appointments to review with your doctor.     Review the Heart Failure Zone  instructions for when to call your doctor.                              Managing your Heart Failure Using the "Zones"    @LASTWEIGHT (1)@      Green Zone - All Clear  Action      No new or worsening shortness of breath   No new or worsening swelling of your hands, abdomen, legs or ankles   No Weight gain   No chest pain or tightness   No decrease in your ability to maintain your activity level      Continue taking your medications as ordered   Continue your daily weights   Follow a low salt diet   Keep all physician appointments and follow-ups     Yellow Zone - Caution Action      Weight gain of 2 pounds in a day   Increased swelling of your hands, abdomen, legs, or ankles   Increased in shortness of breath with activity   Increase in the number of pillows needed to sleep at night   New or more frequent chest pain or tightness   New onset of dizziness or lightheadedness after standing up      Call your physician's office      Do not wait until you are RED to call.    Call for even one symptom!     Red Zone - "Emergency" Action      Unrelieved shortness of breath or shortness of breath at rest   Unrelieved chest pain   Wheezing or chest tightness at rest   Need to sit in a chair to sleep   Weight gain of more than 3 pounds in a day or 5 pounds in a week   A fall related to dizziness or lightheadedness      CALL your physician's office right away!      Call 911 if you:   Faint or pass out   Become extremely short of breath or are unable to talk due to breathlessness   Have severe chest pain     Learn more at [email or web address]                                  Heart Failure Education Checklist      I understand that my heart has structural and/or functional problems which cause the symptoms of heart failure.     I know what my Ejection Fraction (EF), a measure of heart function, is:________%     I know what medications I take for heart failure and understand how to take them     I understand the importance of taking my medications, exactly as they are prescribed so that I can feel better, live longer, and stay out of the hospital     I understand the importance of weighing myself daily and that I should contact my physician if I have unexpected weight gain (2 pounds in one day)     I know what symptoms to look for to make sure my heart failure is not getting worse such as swelling of legs or ankles, more trouble breathing, dizziness, or feeling tired     I understand that physical activity is good for me and I have been given instructions on what type of exercises to do     I understand  that I should maintain a low salt diet, monitor my fluid intake, and follow dietary instructions provided by my healthcare provider     I understand the heart failure zones and know what to do if my symptoms are in the yellow or red zone    Acute Myocardial Infarction Discharge Instructions    Medication - Anti platelet agents:    Some patients after a heart attack get a coronary stent.  If you are one of these patients, then it is common to take both aspirin and anti-platelet medication (clopidogrel, prasugrel, ticlopidine, or ticagrelor) for at least one year.  Never stop this combination of medication without discussing first with your cardiologist.      Follow up:   Most likely there are specific follow up instructions listed in another section of these discharge instructions.  If we have not already scheduled a follow-up appointment with your physician (Cardiologist or PCP (Primary Care Physician), a good rule of thumb is to call your PCP as soon as you are home to see this doctor within 3-5 days of  discharge.  If you do not have a PCP, please call [phone] for assistance 24 hours a day, 7 days a week.    Risk Factors:             All patients are advised to not smoke or use any tobacco products. If you use tobacco products and want help in quitting, call the QuitLine: 1-800-QUIT-NOW (737-380-2282).    Diet:  Follow a heart healthy diet low in saturated fat and cholesterol. You may have a more specific diet personalized for you in the patient instructions section.  Weighing yourself daily can alert you to fluid gain and may motivate you to lose weight.    Activity:           Unless otherwise instructed, in the patient instructions section, gradually increase your activity as tolerated.  Remember that recovery after a heart attack takes time. Plan to rest for at least 4-8 weeks while you recover. Then return to normal activity when your doctor says it's okay. Talk to your physician about an exercise program and or cardiac rehabilitation. Should your warning symptoms worsen or recur, especially with exercise; please contact your physician.    Heart Attack Warning Signs:   Chest Discomfort: Most heart attacks involve discomfort in the center of the chest that lasts more than a few minutes, or that goes away and comes back.  It can feel like uncomfortable pressure, squeezing, fullness or pain.   Discomfort in other areas of the upper body   Shortness of breath   Other signs  When to Seek Medical Attention  Symptoms Action    Chest Pain not relieved by medication*   Shortness of breath   Lightheadedness Call 9-1-1 immediately    Dizziness   Fainting   Feeling irregular heartbeat or fast pulse Call your doctor immediately     *IF you experience chest pain or symptoms similar to what brought you in to the hospital: take your medication nitroglycerin by mouth as prescribed.                                              Our patients are the reason for all we do: we want to improve and  you can help! You may receive a survey asking about your visit -  this will come from [place] through postal mail or via email from our Product manager. Please take the time to complete it; your valuable input will be used to recognize exceptional members of the care team and improve the quality of our service. Thank you!  Patient Signature: ____________________________________________________________   Date and Time: ____________________________________________________________   Responsible Adult: ____________________________________________________________   Date and Time: ____________________________________________________________   Nurse Signature: ____________________________________________________________   Date: ____________________________________________________________

## 2017-08-30 NOTE — Progress Notes (Addendum)
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  ? 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 Normal MPI 06/2016.    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   History of syncope in the past that has never been proven to represent ventricular arrhythmias by device interrogations. 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  Was on Exforge as OP which has been discontinued   Otherwise appears stable for d/c to rehab   Will arrange follow up in 1 week after d/c from rehab      Medications:      Scheduled Meds: PRN Meds:      albuterol-ipratropium 3 mL Nebulization Q6H SCH   amitriptyline 25 mg Oral QHS   atorvastatin 40 mg Oral QHS   carvedilol 6.25 mg Oral Q12H SCH   docusate sodium 100 mg Oral BID   fluticasone furoate-vilanterol 1 puff Inhalation QAM   furosemide 40 mg Oral Daily   insulin glargine 50 Units Subcutaneous QHS   insulin lispro 1-4 Units Subcutaneous QHS   insulin lispro 1-8 Units Subcutaneous TID AC   lactobacillus/streptococcus 1 capsule Oral Daily   pantoprazole 40 mg Oral QAM AC   predniSONE 10 mg Oral BID Meals   tamsulosin 0.4 mg Oral QD after dinner       Continuous Infusions:     acetaminophen 650 mg Q4H PRN   Or     acetaminophen 650 mg Q4H PRN   ALPRAZolam 0.25 mg BID PRN   benzonatate 100 mg TID PRN   bisacodyl 10 mg QD PRN   calcium carbonate 500 mg 4X Daily PRN   dextrose 15 g of glucose PRN   And     dextrose  12.5 g PRN   And     glucagon (rDNA) 1 mg PRN   naloxone 0.2 mg PRN   ondansetron 4 mg Q12H PRN   zolpidem 5 mg QHS PRN             Subjective:   Denies chest pain, SOB or palpitations.      Physical Exam:     Vitals:    08/30/17 0825   BP: 130/89   Pulse: 85   Resp: 18   Temp: 98.2 F (36.8 C)   SpO2: 99%     Temp (24hrs), Avg:97.9 F (36.6 C), Min:97.3 F (36.3 C), Max:98.4 F (36.9 C)      Telemetry reviewed, paced, no further NSVT, occ PVC     Intake and Output Summary (Last 24 hours) at Date Time    Intake/Output Summary (Last 24 hours) at 08/30/17 1124  Last data filed at 08/30/17 1121   Gross per 24 hour   Intake              480 ml   Output  1975 ml   Net            -1495 ml       General Appearance:  Breathing comfortable, no acute distress  Head:  normocephalic  Eyes:  EOM's intact  Neck:  No jugular venous distension  Lungs:  Clear to auscultation throughout, no wheezes, rhonchi or rales, good respiratory effort   Chest Wall:  No tenderness or deformity  Heart:  S1, S2 normal, RRR, 2/6 SEM   Abdomen:  Soft, non-tender, positive bowel sounds, + hepatojugular reflux  Extremities:  No cyanosis or clubbing, + trace edema BLE  Pulses:  Equal radial pulses, 4/4 symmetric  Neurologic:  Alert and oriented x3, mood and affect normal  Musculoskeletal: normal strength and tone    Labs:                 Recent Labs  Lab 08/26/17  0812  08/24/17  0748   Bilirubin, Total  --   --  0.6   Protein, Total  --   --  6.6   Albumin 2.8* More results in Results Review 3.7   ALT  --   --  214*   AST (SGOT)  --   --  181*   More results in Results Review = values in this interval not displayed.    Recent Labs  Lab 08/29/17  0540   Magnesium 2.0       Recent Labs  Lab 08/26/17  0812   PT 16.9*   PT INR 1.4*   PTT 32       Recent Labs  Lab 08/29/17  0540 08/28/17  0340 08/27/17  0425   WBC 15.54* 12.01* 11.56*   Hgb 13.7 13.4 12.4*   Hematocrit 41.4* 40.5* 37.9*   Platelets 148 139* 137*       Recent Labs  Lab  08/29/17  0540 08/28/17  0340 08/27/17  0425   Sodium 138 137 137   Potassium 4.6 4.9 4.7   Chloride 105 103 103   CO2 29 26 29    BUN 26.4 28.1* 25.7   Creatinine 1.2 1.3 1.4*   EGFR >60.0 >60.0 >60.0   Glucose 176* 267* 257*   Calcium 8.8 8.8 8.9           Invalid input(s): FREET4    .  Lab Results   Component Value Date    BNP 1,551.7 (H) 08/24/2017      Estimated Creatinine Clearance: 71.5 mL/min (based on SCr of 1.2 mg/dL).    Weight Monitoring 08/25/2017 08/26/2017 08/27/2017 08/27/2017 08/28/2017 08/29/2017 08/30/2017   Height - - - 182.9 cm - - -   Height Method - - - - - - -   Weight 104.3 kg 104.1 kg 107.4 kg 108.41 kg 108.41 kg 104.3 kg 104.01 kg   Weight Method Bed Scale Bed Scale Bed Scale Bed Scale Bed Scale Bed Scale Bed Scale   BMI (calculated) - - - 32.5 kg/m2 - - -         Imaging:   Radiological Procedure reviewed.              Signed by: Cindee Lame, NP    Patient seen and examined.  Agree with NP/PA note, exam, and plan as above with changes in italics.    [name] [name] A. Marnette Burgess, MD, [facility]    [facility]  NP Spectralink [phone] (8am-5pm)  MD Spectralink [phone] (8am-5pm)  After hours, non urgent consult line [phone]  After Hours, urgent consults [phone]

## 2017-08-31 DIAGNOSIS — I509 Heart failure, unspecified: Secondary | ICD-10-CM | POA: Insufficient documentation

## 2017-09-03 NOTE — Retrospective Coding Query (Signed)
PHYSICIAN'S DOCUMENTATION                                                                      REQUEST                                                                         Date of Request:  09/03/2017  Type of Request:  DOCUMENTATION CLARIFICATION                                         Patient Name: [name], [name]  Account #: [id number]  MR #: [id number]  Discharge Date: 08/30/2017      Dear Dr. Vladimir Faster,      Medical Record Reflects    71 year old adm for hypoxia/sepsis, Vital signs in ER temp 97.2, B/P 126/96, resp 39, hr 79.  Acute respiratory failure with altered mental status/SIRS/hypoxia--presumed  secondary to asthma flare up and acute systolic congestive heart failure.   Severe metabolic acidosis, most likely a combination of renal failure with systemic inflammatory S Conn syndrome--improved significantly     D/C summary (Active Problems)  Sepsis, due to unspecified organism and has been taken care as mentioned below ; no clarity to Sepsis in hospital course.    Request to Physician       Please clarify the status of the diagnosis of sepsis for this patient encounter:      ___   Sepsis ruled in   ___   Sepsis ruled out  ___   Systemic Inflammatory Response  only   ___   Other (please specify)    Important Note:  In the inpatient setting, you may document "Possible" "Probable" diagnoses that you have not ruled out at point of discharge. Any conditions ruled out during the hospital stay will not be coded. Documentation should include the medical decision making process and  PHYSICIAN RESPONSE:      Systemic inflammatory response syndrome only     Signed,  Dr.Naleyah Ohlinger                   Coder Benjamine Mola  Date 09/03/2017

## 2017-09-16 ENCOUNTER — Ambulatory Visit (INDEPENDENT_AMBULATORY_CARE_PROVIDER_SITE_OTHER): Payer: Self-pay | Admitting: Medical

## 2017-09-16 ENCOUNTER — Emergency Department: Payer: Commercial Managed Care - POS

## 2017-09-16 ENCOUNTER — Inpatient Hospital Stay
Admission: EM | Admit: 2017-09-16 | Discharge: 2017-09-21 | DRG: 291 | Disposition: A | Discharge status: Home Health | Payer: Commercial Managed Care - POS | Attending: Internal Medicine | Admitting: Internal Medicine

## 2017-09-16 DIAGNOSIS — N183 Chronic kidney disease, stage 3 unspecified: Secondary | ICD-10-CM | POA: Diagnosis present

## 2017-09-16 DIAGNOSIS — Z7951 Long term (current) use of inhaled steroids: Secondary | ICD-10-CM

## 2017-09-16 DIAGNOSIS — R0600 Dyspnea, unspecified: Secondary | ICD-10-CM

## 2017-09-16 DIAGNOSIS — R0602 Shortness of breath: Secondary | ICD-10-CM

## 2017-09-16 DIAGNOSIS — D72820 Lymphocytosis (symptomatic): Secondary | ICD-10-CM

## 2017-09-16 DIAGNOSIS — I2510 Atherosclerotic heart disease of native coronary artery without angina pectoris: Secondary | ICD-10-CM | POA: Diagnosis present

## 2017-09-16 DIAGNOSIS — R778 Other specified abnormalities of plasma proteins: Secondary | ICD-10-CM

## 2017-09-16 DIAGNOSIS — I420 Dilated cardiomyopathy: Secondary | ICD-10-CM | POA: Diagnosis present

## 2017-09-16 DIAGNOSIS — Z794 Long term (current) use of insulin: Secondary | ICD-10-CM

## 2017-09-16 DIAGNOSIS — Z955 Presence of coronary angioplasty implant and graft: Secondary | ICD-10-CM

## 2017-09-16 DIAGNOSIS — I5023 Acute on chronic systolic (congestive) heart failure: Secondary | ICD-10-CM | POA: Diagnosis present

## 2017-09-16 DIAGNOSIS — I10 Essential (primary) hypertension: Secondary | ICD-10-CM | POA: Diagnosis present

## 2017-09-16 DIAGNOSIS — I509 Heart failure, unspecified: Secondary | ICD-10-CM | POA: Diagnosis present

## 2017-09-16 DIAGNOSIS — E785 Hyperlipidemia, unspecified: Secondary | ICD-10-CM | POA: Diagnosis present

## 2017-09-16 DIAGNOSIS — I255 Ischemic cardiomyopathy: Secondary | ICD-10-CM

## 2017-09-16 DIAGNOSIS — G4733 Obstructive sleep apnea (adult) (pediatric): Secondary | ICD-10-CM | POA: Diagnosis present

## 2017-09-16 DIAGNOSIS — J45909 Unspecified asthma, uncomplicated: Secondary | ICD-10-CM | POA: Diagnosis present

## 2017-09-16 DIAGNOSIS — I130 Hypertensive heart and chronic kidney disease with heart failure and stage 1 through stage 4 chronic kidney disease, or unspecified chronic kidney disease: Principal | ICD-10-CM | POA: Diagnosis present

## 2017-09-16 DIAGNOSIS — R7989 Other specified abnormal findings of blood chemistry: Secondary | ICD-10-CM | POA: Diagnosis present

## 2017-09-16 DIAGNOSIS — N179 Acute kidney failure, unspecified: Secondary | ICD-10-CM | POA: Diagnosis present

## 2017-09-16 DIAGNOSIS — R000 Tachycardia, unspecified: Secondary | ICD-10-CM | POA: Insufficient documentation

## 2017-09-16 DIAGNOSIS — I428 Other cardiomyopathies: Secondary | ICD-10-CM | POA: Diagnosis present

## 2017-09-16 DIAGNOSIS — D649 Anemia, unspecified: Secondary | ICD-10-CM

## 2017-09-16 DIAGNOSIS — R059 Cough, unspecified: Secondary | ICD-10-CM

## 2017-09-16 DIAGNOSIS — I429 Cardiomyopathy, unspecified: Secondary | ICD-10-CM | POA: Diagnosis present

## 2017-09-16 DIAGNOSIS — E875 Hyperkalemia: Secondary | ICD-10-CM | POA: Diagnosis present

## 2017-09-16 DIAGNOSIS — R063 Periodic breathing: Secondary | ICD-10-CM

## 2017-09-16 DIAGNOSIS — I472 Ventricular tachycardia: Secondary | ICD-10-CM | POA: Diagnosis present

## 2017-09-16 DIAGNOSIS — J9601 Acute respiratory failure with hypoxia: Secondary | ICD-10-CM | POA: Diagnosis present

## 2017-09-16 DIAGNOSIS — Z95810 Presence of automatic (implantable) cardiac defibrillator: Secondary | ICD-10-CM

## 2017-09-16 DIAGNOSIS — K761 Chronic passive congestion of liver: Secondary | ICD-10-CM | POA: Diagnosis present

## 2017-09-16 DIAGNOSIS — Z8701 Personal history of pneumonia (recurrent): Secondary | ICD-10-CM

## 2017-09-16 DIAGNOSIS — E1122 Type 2 diabetes mellitus with diabetic chronic kidney disease: Secondary | ICD-10-CM | POA: Diagnosis present

## 2017-09-16 DIAGNOSIS — E872 Acidosis: Secondary | ICD-10-CM | POA: Diagnosis present

## 2017-09-16 DIAGNOSIS — I248 Other forms of acute ischemic heart disease: Secondary | ICD-10-CM | POA: Diagnosis present

## 2017-09-16 DIAGNOSIS — K5900 Constipation, unspecified: Secondary | ICD-10-CM | POA: Diagnosis not present

## 2017-09-16 DIAGNOSIS — Z79899 Other long term (current) drug therapy: Secondary | ICD-10-CM

## 2017-09-16 LAB — CBC AND DIFFERENTIAL
Absolute NRBC: 0 10*3/uL
Hematocrit: 42.2 % (ref 42.0–52.0)
Hgb: 13.9 g/dL (ref 13.0–17.0)
MCH: 30.5 pg (ref 28.0–32.0)
MCHC: 32.9 g/dL (ref 32.0–36.0)
MCV: 92.7 fL (ref 80.0–100.0)
MPV: 12.4 fL — ABNORMAL HIGH (ref 9.4–12.3)
Nucleated RBC: 0 /100 WBC (ref 0.0–1.0)
Platelets: 161 10*3/uL (ref 140–400)
RBC: 4.55 10*6/uL — ABNORMAL LOW (ref 4.70–6.00)
RDW: 16 % — ABNORMAL HIGH (ref 12–15)
WBC: 11.21 10*3/uL — ABNORMAL HIGH (ref 3.50–10.80)

## 2017-09-16 LAB — ARTERIAL BLOOD GAS (~~LOC~~)
ARTERIAL PCO2 CORRECTED: 38 mmHg (ref 35.0–45.0)
ARTERIAL PH CORRECTED: 7.4 (ref 7.35–7.45)
Arterial pO2 Corrected: 171 mmHg — ABNORMAL HIGH (ref 80.0–100.0)
BIPAP(E): 5 cmH2O
BIPAP(I): 10 cmH2O
Base Excess, Arterial: -1.1 mEq/L (ref <=2.0)
FIO2: 40 %
HCO3, Arterial: 23.5 mEq/L (ref 22.0–28.0)
O2 Sat, Arterial: 99.5 % (ref 95.0–100.0)
Rate: 12 {beats}/min
Temperature: 98.6
pCO2, Arterial: 38 mmHg (ref 35.0–45.0)
pH, Arterial: 7.4 (ref 7.35–7.45)
pO2, Arterial: 171 mmHg — ABNORMAL HIGH (ref 80.0–100.0)

## 2017-09-16 LAB — MAN DIFF ONLY
Band Neutrophils Absolute: 0 10*3/uL (ref 0.00–1.00)
Band Neutrophils: 0 %
Basophils Absolute Manual: 0.11 10*3/uL (ref 0.00–0.20)
Basophils Manual: 1 %
Eosinophils Absolute Manual: 0 10*3/uL (ref 0.00–0.70)
Eosinophils Manual: 0 %
Lymphocytes Absolute Manual: 7.17 10*3/uL — ABNORMAL HIGH (ref 0.50–4.40)
Lymphocytes Manual: 64 %
Monocytes Absolute: 0.22 10*3/uL (ref 0.00–1.20)
Monocytes Manual: 2 %
Neutrophils Absolute Manual: 3.7 10*3/uL (ref 1.80–8.10)
Segmented Neutrophils: 33 %

## 2017-09-16 LAB — COMPREHENSIVE METABOLIC PANEL
ALT: 116 U/L — ABNORMAL HIGH (ref 0–55)
AST (SGOT): 73 U/L — ABNORMAL HIGH (ref 5–34)
Albumin/Globulin Ratio: 1.3 (ref 0.9–2.2)
Albumin: 3.6 g/dL (ref 3.5–5.0)
Alkaline Phosphatase: 185 U/L — ABNORMAL HIGH (ref 38–106)
Anion Gap: 12 (ref 5.0–15.0)
BUN: 23.9 mg/dL (ref 9.0–28.0)
Bilirubin, Total: 1.5 mg/dL — ABNORMAL HIGH (ref 0.2–1.2)
CO2: 19 mEq/L — ABNORMAL LOW (ref 22–29)
Calcium: 9.2 mg/dL (ref 7.9–10.2)
Chloride: 108 mEq/L (ref 100–111)
Creatinine: 1.7 mg/dL — ABNORMAL HIGH (ref 0.7–1.3)
Globulin: 2.8 g/dL (ref 2.0–3.6)
Glucose: 200 mg/dL — ABNORMAL HIGH (ref 70–100)
Potassium: 5.2 mEq/L — ABNORMAL HIGH (ref 3.5–5.1)
Protein, Total: 6.4 g/dL (ref 6.0–8.3)
Sodium: 139 mEq/L (ref 136–145)

## 2017-09-16 LAB — CELL MORPHOLOGY
Cell Morphology: ABNORMAL — AB
Platelet Estimate: NORMAL

## 2017-09-16 LAB — URINALYSIS WITH MICROSCOPIC
Bilirubin, UA: NEGATIVE
Blood, UA: NEGATIVE
Glucose, UA: NEGATIVE
Ketones UA: NEGATIVE
Leukocyte Esterase, UA: NEGATIVE
Nitrite, UA: NEGATIVE
Protein, UR: NEGATIVE
Specific Gravity UA: 1.006 (ref 1.001–1.035)
Urine pH: 7 (ref 5.0–8.0)
Urobilinogen, UA: NORMAL mg/dL

## 2017-09-16 LAB — SODIUM, URINE, RANDOM: Urine Sodium Random: 125 mEq/L

## 2017-09-16 LAB — GLUCOSE WHOLE BLOOD - POCT: Whole Blood Glucose POCT: 125 mg/dL — ABNORMAL HIGH (ref 70–100)

## 2017-09-16 LAB — B-TYPE NATRIURETIC PEPTIDE: B-Natriuretic Peptide: 1792.4 pg/mL — ABNORMAL HIGH (ref 0.0–100.0)

## 2017-09-16 LAB — MAGNESIUM: Magnesium: 2.1 mg/dL (ref 1.6–2.6)

## 2017-09-16 LAB — TROPONIN I: Troponin I: 0.11 ng/mL — ABNORMAL HIGH (ref 0.00–0.09)

## 2017-09-16 LAB — GFR: EGFR: 48.4

## 2017-09-16 LAB — PHOSPHORUS: Phosphorus: 4.3 mg/dL (ref 2.3–4.7)

## 2017-09-16 MED ORDER — FLUTICASONE-SALMETEROL 100-50 MCG/DOSE IN AEPB
1.00 | INHALATION_SPRAY | Freq: Two times a day (BID) | RESPIRATORY_TRACT | Status: DC
Start: 2017-09-16 — End: 2017-09-21
  Administered 2017-09-17 – 2017-09-21 (×9): 1 via RESPIRATORY_TRACT
  Filled 2017-09-16: qty 14

## 2017-09-16 MED ORDER — FUROSEMIDE 10 MG/ML IJ SOLN
40.00 mg | Freq: Once | INTRAMUSCULAR | Status: DC
Start: 2017-09-16 — End: 2017-09-16
  Administered 2017-09-16: 40 mg via INTRAVENOUS
  Filled 2017-09-16: qty 4

## 2017-09-16 MED ORDER — FLUTICASONE FUROATE-VILANTEROL 100-25 MCG/INH IN AEPB
1.00 | INHALATION_SPRAY | Freq: Every morning | RESPIRATORY_TRACT | Status: DC
Start: 2017-09-17 — End: 2017-09-16

## 2017-09-16 MED ORDER — DEXTROSE 50 % IV SOLN
12.50 g | INTRAVENOUS | Status: DC | PRN
Start: 2017-09-16 — End: 2017-09-21

## 2017-09-16 MED ORDER — AMITRIPTYLINE HCL 25 MG PO TABS
25.00 mg | ORAL_TABLET | Freq: Every evening | ORAL | Status: DC
Start: 2017-09-16 — End: 2017-09-21
  Administered 2017-09-16 – 2017-09-20 (×5): 25 mg via ORAL
  Filled 2017-09-16 (×5): qty 1

## 2017-09-16 MED ORDER — PANTOPRAZOLE SODIUM 40 MG PO TBEC
40.00 mg | DELAYED_RELEASE_TABLET | Freq: Every morning | ORAL | Status: DC
Start: 2017-09-17 — End: 2017-09-21
  Administered 2017-09-17 – 2017-09-21 (×5): 40 mg via ORAL
  Filled 2017-09-16 (×5): qty 1

## 2017-09-16 MED ORDER — HEPARIN SODIUM (PORCINE) 5000 UNIT/ML IJ SOLN
5000.00 [IU] | Freq: Two times a day (BID) | INTRAMUSCULAR | Status: DC
Start: 2017-09-16 — End: 2017-09-21
  Administered 2017-09-16 – 2017-09-21 (×10): 5000 [IU] via SUBCUTANEOUS
  Filled 2017-09-16 (×11): qty 1

## 2017-09-16 MED ORDER — BISACODYL 5 MG PO TBEC
10.00 mg | DELAYED_RELEASE_TABLET | Freq: Every day | ORAL | Status: DC | PRN
Start: 2017-09-16 — End: 2017-09-21

## 2017-09-16 MED ORDER — FUROSEMIDE 10 MG/ML IJ SOLN
60.00 mg | Freq: Three times a day (TID) | INTRAMUSCULAR | Status: DC
Start: 2017-09-16 — End: 2017-09-18
  Administered 2017-09-16 – 2017-09-17 (×4): 60 mg via INTRAVENOUS
  Filled 2017-09-16 (×2): qty 6
  Filled 2017-09-16: qty 8
  Filled 2017-09-16: qty 6

## 2017-09-16 MED ORDER — FUROSEMIDE 10 MG/ML IJ SOLN
40.00 mg | Freq: Two times a day (BID) | INTRAMUSCULAR | Status: DC
Start: 2017-09-17 — End: 2017-09-16

## 2017-09-16 MED ORDER — TAMSULOSIN HCL 0.4 MG PO CAPS
0.40 mg | ORAL_CAPSULE | Freq: Every day | ORAL | Status: DC
Start: 2017-09-16 — End: 2017-09-21
  Administered 2017-09-16 – 2017-09-20 (×5): 0.4 mg via ORAL
  Filled 2017-09-16 (×5): qty 1

## 2017-09-16 MED ORDER — GLUCOSE 40 % PO GEL
15.00 g | ORAL | Status: DC | PRN
Start: 2017-09-16 — End: 2017-09-21

## 2017-09-16 MED ORDER — NIACIN ER (ANTIHYPERLIPIDEMIC) 500 MG PO TBCR
500.00 mg | EXTENDED_RELEASE_TABLET | Freq: Every evening | ORAL | Status: DC
Start: 2017-09-16 — End: 2017-09-21
  Administered 2017-09-16 – 2017-09-20 (×5): 500 mg via ORAL
  Filled 2017-09-16 (×6): qty 1

## 2017-09-16 MED ORDER — ACETAMINOPHEN 325 MG PO TABS
650.00 mg | ORAL_TABLET | ORAL | Status: DC | PRN
Start: 2017-09-16 — End: 2017-09-21
  Administered 2017-09-17: 650 mg via ORAL
  Filled 2017-09-16: qty 2

## 2017-09-16 MED ORDER — GLUCAGON 1 MG IJ SOLR (WRAP)
1.00 mg | INTRAMUSCULAR | Status: DC | PRN
Start: 2017-09-16 — End: 2017-09-21

## 2017-09-16 MED ORDER — ATORVASTATIN CALCIUM 20 MG PO TABS
40.00 mg | ORAL_TABLET | Freq: Every evening | ORAL | Status: DC
Start: 2017-09-16 — End: 2017-09-21
  Administered 2017-09-16 – 2017-09-20 (×5): 40 mg via ORAL
  Filled 2017-09-16 (×5): qty 2

## 2017-09-16 MED ORDER — INSULIN LISPRO 100 UNIT/ML SC SOLN
1.00 [IU] | Freq: Three times a day (TID) | SUBCUTANEOUS | Status: DC | PRN
Start: 2017-09-16 — End: 2017-09-21
  Administered 2017-09-17: 2 [IU] via SUBCUTANEOUS
  Administered 2017-09-18: 12:00:00 3 [IU] via SUBCUTANEOUS
  Administered 2017-09-19: 2 [IU] via SUBCUTANEOUS
  Administered 2017-09-19: 18:00:00 1 [IU] via SUBCUTANEOUS
  Administered 2017-09-20 (×2): 2 [IU] via SUBCUTANEOUS
  Administered 2017-09-21: 12:00:00 1 [IU] via SUBCUTANEOUS
  Filled 2017-09-16: qty 3
  Filled 2017-09-16: qty 6
  Filled 2017-09-16: qty 9
  Filled 2017-09-16: qty 6
  Filled 2017-09-16: qty 3
  Filled 2017-09-16: qty 6

## 2017-09-16 MED ORDER — ALBUTEROL SULFATE (2.5 MG/3ML) 0.083% IN NEBU
2.50 mg | INHALATION_SOLUTION | Freq: Two times a day (BID) | RESPIRATORY_TRACT | Status: DC | PRN
Start: 2017-09-16 — End: 2017-09-21

## 2017-09-16 MED ORDER — CARVEDILOL 3.125 MG PO TABS
6.25 mg | ORAL_TABLET | Freq: Two times a day (BID) | ORAL | Status: DC
Start: 2017-09-16 — End: 2017-09-21
  Administered 2017-09-16 – 2017-09-21 (×10): 6.25 mg via ORAL
  Filled 2017-09-16 (×10): qty 2

## 2017-09-16 MED ORDER — INSULIN GLARGINE 100 UNIT/ML SC SOLN
20.00 [IU] | Freq: Every evening | SUBCUTANEOUS | Status: DC
Start: 2017-09-16 — End: 2017-09-18
  Administered 2017-09-16 – 2017-09-17 (×2): 20 [IU] via SUBCUTANEOUS
  Filled 2017-09-16 (×2): qty 20

## 2017-09-16 NOTE — Consults (Addendum)
Respiratory therapy called --  Pt has long apnea as soon as he fell asleep -- also has episode of VT --   Pt was breathing OK while awake but placed back on BPAP with back up rate 2/2 apnea -- pt has recently diagnosed OSA per pt and he is waiting for his CPAP machine --   Recommend : continue BPAP while asleep -- may use prn WA - consult pts primary Pulmonary team .   D/w admitting team -- NSVT -- pt has severe cardiomyopathy - K is OK   Check Mag   Pt has ICD devise in place --         Critical Care Consult       Date Time: 09/16/17 5:47 PM  Patient Name: [name] [name]  Attending Physician: Helayne Seminole, MD      Subjective:   Patient Seen and Examined. The notes, labs and  X-rays  were reviewed.     Critical care consult as pt in the ER send by his Cardiologist for SOB . He was noted to have long apneas especially when he doses off -- he was placed on BPAP and critical Care consult is kindly requested .   Pt is aao / afebrile/labs checked  CXR suggestive of CHF         Assessment:     Hypoxia   Apneas   Severe Cardiomyopathy / CHF   AKI / CKD  OSA          Plan:n:     Pt on BPAP  ABG on NC reviewed  ( noted BPAP incomputer-Nurse told me it was NC )   Pt probably has CSB with apneas noted by staff even while he is awake -- and especially worse when he doses off   Pt is awake now   On BPAP -- I took it off and he is talking ( complete sentences )  Breathing is not labored . Oxygenating OK   He can be admitted on Cardiac tele with BPAP while asleep   Consult pts Pulmonologist   D/.w ER staff/ Doctor                 Review of Systems:   General ROS: negative, no weight loss/gain, no fever, no chills, no rigor   ENT ROS: negative   Endocrine ROS: negative, no fatigue, no polydipsia   Respiratory ROS: no cough, +shortness of breath, or no wheezing   Cardiovascular ROS: no chest pain or +dyspnea on exertion   Gastrointestinal ROS: no abdominal pain, change in bowel habits, or black or bloody stools    Genito-Urinary ROS: no dysuria, trouble voiding, or hematuria   Musculoskeletal ROS: negative   Neurological ROS: no encephalopathy   Dermatological ROS: negative, no rash, no ulcer  Psych:    Cooperative    Past Medical History:   Diagnosis Date   . AICD (automatic cardioverter/defibrillator) present 2014   . CAD (coronary artery disease)    . Cardiac defibrillator in situ    . CHF (congestive heart failure) 08/12/2014   . Diabetes mellitus    . Heart attack 2011    and 2014   . Hyperlipemia    . Hypertension    . Ischemic cardiomyopathy     20%   . Pneumonia 09/2016      History   Alcohol Use No      History   Smoking Status   . Never Smoker   Smokeless Tobacco   .  Never Used      History   Drug Use No        Physical Exam:   BP (!) 140/97   Pulse 100   Temp 98.7 F (37.1 C) (Temporal Artery)   Resp 20   Ht 1.829 m (6')   Wt 104 kg (229 lb 4.5 oz)   SpO2 100%   BMI 31.10 kg/m   General appearance - alert, comfortable appearing, and in no distress and well hydrated  Mental status - alert, oriented to person, place, and time  Eyes - pupils equal and reactive, extraocular eye movements intact, sclera anicteric  Ears - generally normal looking, no errythema  Nose - normal and patent, no erythema, discharge or polyps  Mouth - mucous membranes moist, pharynx normal without lesions and tongue normal  Neck - supple, no significant adenopathy and carotids upstroke normal bilaterally, no bruits  Lymphatics - no palpable lymphadenopathy  Chest -   auscultation, no wheezes, few basilar rales , symmetric air entry /   Heart - normal rate and regular rhythm, S1 and S2 normal, P2 not loud , No RV heave / esm   Abdomen - soft, nontender, nondistended, no masses or organomegaly  bowel sounds normal  Neurological - alert, oriented, normal speech, no focal findings or movement disorder noted, neck supple without rigidity, Detail exam not done  Extremities - peripheral pulses normal, + pedal edema, no clubbing or cyanosis,     Skin - normal coloration and turgor, no rashes, no suspicious skin lesions noted     NKDA            Meds:      Scheduled Meds: PRN Meds:      furosemide 60 mg Intravenous TID         Continuous Infusions:          I personally reviewed all of the medications      Labs:     Recent Labs  Lab 09/16/17  1429   Glucose 200*   BUN 23.9   Creatinine 1.7*   Calcium 9.2   Sodium 139   Potassium 5.2*   Chloride 108   CO2 19*   Albumin 3.6   EGFR 48.4         Recent Labs  Lab 09/16/17  1429   AST (SGOT) 73*   ALT 116*   Alkaline Phosphatase 185*   Albumin 3.6   Bilirubin, Total 1.5*         Recent Labs  Lab 09/16/17  1429   WBC 11.21*   Hgb 13.9   Hematocrit 42.2   MCV 92.7   MCH 30.5   MCHC 32.9   RDW 16*   MPV 12.4*   Platelets 161             Invalid input(s): FREET4          Radiology Results (24 Hour)     Procedure Component Value Units Date/Time    XR Chest  AP Portable [[id number]] Collected:  09/16/17 1508    Order Status:  Completed Updated:  09/16/17 1514    Narrative:       HISTORY: Shortness of breath    COMPARISON: 08/28/2017    FINDINGS:   Cardiomegaly with triple lead left-sided pacer device in stable  position. New pulmonary edema. No pneumothorax. Small effusions not  excluded.      Impression:          Cardiomegaly with findings suggestive of pulmonary  edema.    Shelly Flatten, MD   09/16/2017 3:10 PM             Case discussed with:  Team         Evon Slack, MD     11/29/185:47 PM

## 2017-09-16 NOTE — ED Provider Notes (Signed)
Physician/Midlevel provider first contact with patient: 09/16/17 1356         History     Chief Complaint   Patient presents with   . Shortness of Breath   . Congestive Heart Failure     71 yo male here with SOB and CP.  Pt recently admitted earlier this month for acute asthma exacerbation and CHF exacerbation.  Patient was sent to cardiac rehabilitation and was seen today in the office with [facility] were to noted him to be short of breath and concern for acute on chronic congestive heart failure.  Patient is safe for last 3-4 days he has had increased shortness of breath, intermittent chest pain, chills and increased cough.  Pt is currently on Lasix 40 mg and has been compliant.      The history is provided by the patient. No language interpreter was used.            Past Medical History:   Diagnosis Date   . AICD (automatic cardioverter/defibrillator) present 2014   . CAD (coronary artery disease)    . Cardiac defibrillator in situ    . CHF (congestive heart failure) 08/12/2014   . Diabetes mellitus    . Heart attack 2011    and 2014   . Hyperlipemia    . Hypertension    . Ischemic cardiomyopathy     20%   . Pneumonia 09/2016       Past Surgical History:   Procedure Laterality Date   . CARDIAC CATHETERIZATION     . CARDIAC DEFIBRILLATOR PLACEMENT     . CARDIAC PACEMAKER PLACEMENT     . CIRCUMCISION     . CORONARY STENT PLACEMENT  2011   . duodenal ulcer  1973   . EGD N/A 08/15/2014    Procedure: EGD;  Surgeon: Colon Branch, MD;  Location: [facility];  Service: Gastroenterology;  Laterality: N/A;  egd w/ bx   . HERNIA REPAIR      [facility]   . orthopedic surgery      R foot corrective   . TONSILLECTOMY AND ADENOIDECTOMY  1954       Family History   Problem Relation Age of Onset   . Breast cancer Mother    . Heart attack Mother 69   . Hypertension Mother    . Diabetes Mother    . Diabetes Brother    . Heart attack Father    . Hypertension Father        Social  Social History   Substance Use Topics   .  Smoking status: Never Smoker   . Smokeless tobacco: Never Used   . Alcohol use No       .     No Known Allergies    Home Medications     Med List Status:  In Progress Set By: Sharlyne Cai, RN at 09/16/2017  2:13 PM                amitriptyline (ELAVIL) 25 MG tablet     amitriptyline 25 mg tablet   Take 1 tablet every day by oral route.     atorvastatin (LIPITOR) 40 MG tablet     Take 1 tablet (40 mg total) by mouth nightly.     bisacodyl (DULCOLAX) 5 MG EC tablet     Take 2 tablets (10 mg total) by mouth daily as needed for Constipation.     carvedilol (COREG) 6.25 MG tablet  Take 1 tablet (6.25 mg total) by mouth every 12 (twelve) hours.     esomeprazole (NEXIUM) 40 MG capsule          fluticasone-salmeterol (ADVAIR DISKUS) 100-50 MCG/DOSE Aerosol Powder, Breath Activtivatede     Inhale 1 puff into the lungs 2 (two) times daily.     furosemide (LASIX) 40 MG tablet     Take 1 tablet (40 mg total) by mouth daily.     glucose blood (ONE TOUCH ULTRA TEST) test strip     1 each by Other route 2 (two) times daily.Use as instructed     hydrocodone-chlorpheniramine (TUSSIONEX) 10-8 MG/5ML suspension     Take 5 mLs by mouth 2 (two) times daily.     insulin glargine (LANTUS) 100 UNIT/ML injection     Inject 50 Units into the skin nightly.     levalbuterol (XOPENEX) nebulizer solution 1.25 mg     1.25 mg, Nebulization, RT - 3 times daily, First dose on Mon 09/28/16 at 2000     niacin (NIASPAN) 500 MG CR tablet     niacin ER 500 mg tablet,extended release 24 hr     predniSONE (DELTASONE) 10 MG tablet     Prednisone 20 mg po q day ,taper decreasing by 10 mg every 3 days until its off     SYMBICORT 80-4.5 MCG/ACT inhaler          tamsulosin (FLOMAX) 0.4 MG Cap     Take 1 capsule (0.4 mg total) by mouth Daily after dinner.           Review of Systems   Constitutional: Positive for chills. Negative for fever.   HENT: Negative.    Eyes: Negative.    Respiratory: Positive for cough and shortness of breath.    Cardiovascular:  Positive for chest pain and leg swelling.   Gastrointestinal: Negative.    Genitourinary: Negative.    Musculoskeletal: Negative.    Skin: Negative.    Neurological: Negative.    Hematological: Negative.    Psychiatric/Behavioral: Negative.    All other systems reviewed and are negative.      Physical Exam    BP: (!) 139/99, Heart Rate: 93, Temp: 98.7 F (37.1 C), Resp Rate: (!) 30, SpO2: 90 %, Weight: 104 kg    Physical Exam   Constitutional: He is oriented to person, place, and time. He appears well-developed and well-nourished. He appears distressed.   HENT:   Head: Normocephalic and atraumatic.   Mouth/Throat: Oropharynx is clear and moist.   Eyes: Pupils are equal, round, and reactive to light. EOM are normal.   Neck: Normal range of motion. Neck supple.   Cardiovascular: Intact distal pulses.  Tachycardia present.    Pulmonary/Chest: No accessory muscle usage. Tachypnea noted. He has decreased breath sounds.   Abdominal: Soft. Bowel sounds are normal.   Musculoskeletal: He exhibits edema.   2+ pitting edema bilaterally   Neurological: He is alert and oriented to person, place, and time.   Skin: Skin is warm and dry. Capillary refill takes less than 2 seconds.   Psychiatric: He has a normal mood and affect. His behavior is normal. Judgment and thought content normal.   Nursing note and vitals reviewed.        MDM and ED Course     ED Medication Orders     Start Ordered     Status Ordering Provider    09/16/17 1552 09/16/17 1551  furosemide (LASIX) injection 40 mg  Once  Route: Intravenous  Ordered Dose: 40 mg     Last MAR action:  Given Saidi Santacroce HOANG             MDM  Number of Diagnoses or Management Options  Acute on chronic systolic CHF (congestive heart failure):   Elevated troponin I level:   SOB (shortness of breath):   Diagnosis management comments: 3:48 PM spoke with cardiology who has seen the patient and agreed that this is likely acute on chronic systolic heart failure.  Recommend diuresis  with Lasix.  Elevated troponin, likely demand ischemia.    EKG Interpretation  EKG interpreted/review by ED Physician Assistant  Atrial-sensed ventricular paced rhythm  Axis: Normal for age  PR, QRS and QT intervals:  normal for age and rate  ST Segments: No deviations suggestive of ischemia  Impression: Abnormal ECG with no evidence of ischemia.    *This note was generated by the Epic EMR system/ Dragon speech recognition and may contain inherent errors or omissions not intended by the user. Grammatical errors, random word insertions, deletions, pronoun errors and incomplete sentences are occasional consequences of this technology due to software limitations. Not all errors are caught or corrected. If there are questions or concerns about the content of this note or information contained within the body of this dictation they should be addressed directly with the author for clarification    The attending signature signifies review and agreement of the history , PE, evaluation, clinical impression and discharge plan except as otherwise noted.    I, Hersel Mcmeen Albertine Grates, have been the primary provider for [name] during this Emergency Dept visit.    I reviewed nursing recorded vitals and history including PMSFHX    Oxygen saturation by pulse oximetry is 95%-100%, Normal.  Interventions: None Needed.    Plan:  Patient with episode of apnea when he is falling asleep.  This occur quite frequently while he is done here in the ER, unless we are in the room talking with the patient.  Therefore, we decided to use BiPAP for his sleep apnea.  Spoke with Dr. Welton Flakes and Dr. Nedra Hai and if we are using the BiPAP due to sleep apnea, not because he is in respiratory distress.  Patient can go to the floor.  Spoke with Morrie Sheldon, NP, and plan to observe for acute on chronic congestive heart failure.         Amount and/or Complexity of Data Reviewed  Clinical lab tests: ordered and reviewed  Tests in the radiology section of CPT:  ordered and reviewed  Tests in the medicine section of CPT: ordered and reviewed  Review and summarize past medical records: yes  Discuss the patient with other providers: yes    Risk of Complications, Morbidity, and/or Mortality  Presenting problems: high  Diagnostic procedures: high  Management options: high    Patient Progress  Patient progress: stable                   Procedures    Results     Procedure Component Value Units Date/Time    Arterial Blood Gas (ABG) [[id number]]  (Abnormal) Collected:  09/16/17 1528     Updated:  09/16/17 1542     ABG CollectionSite Right Radl     Allen's Test No PT NOT ABLE     Temperature 98.6     ARTERIAL PH CORRECTED 7.40     ARTERIAL PCO2 CORRECTED 38.0 mmHg      Arterial pO2 Corrected  171.0 (H) mmHg      pH, Arterial 7.40     pCO2, Arterial 38.0 mmHg      pO2, Arterial 171.0 (H) mmHg      HCO3, Arterial 23.5 mEq/L      Base Excess, Arterial -1.1 mEq/L      O2 Sat, Arterial 99.5 %      FIO2 40.0 %      O2 Delivery BiPAP     Rate 12 BPM      BIPAP(I) 10.0 cmH2O      BIPAP(E) 5.0 cmH2O     B-type Natriuretic Peptide [[id number]]  (Abnormal) Collected:  09/16/17 1429    Specimen:  Blood Updated:  09/16/17 1526     B-Natriuretic Peptide 1,792.4 (H) pg/mL     Manual Differential [[id number]]  (Abnormal) Collected:  09/16/17 1429     Updated:  09/16/17 1504     Segmented Neutrophils 33 %      Band Neutrophils 0 %      Lymphocytes Manual 64 %      Monocytes Manual 2 %      Eosinophils Manual 0 %      Basophils Manual 1 %      Abs Seg Manual 3.70 x10 3/uL      Bands Absolute 0.00 x10 3/uL      Absolute Lymph Manual 7.17 (H) x10 3/uL      Monocytes Absolute 0.22 x10 3/uL      Absolute Eos Manual 0.00 x10 3/uL      Absolute Baso Manual 0.11 x10 3/uL     Cell MorpHology [[id number]]  (Abnormal) Collected:  09/16/17 1429     Updated:  09/16/17 1504     Cell Morphology: Abnormal (A)     Platelet Estimate Normal     Polychromasia Present (A)     Dual RBC population Present (A)     Platelet  Clumps Present (A)    CBC with differential [[id number]]  (Abnormal) Collected:  09/16/17 1429    Specimen:  Blood from Blood Updated:  09/16/17 1504     WBC 11.21 (H) x10 3/uL      Hgb 13.9 g/dL      Hematocrit 62.9 %      Platelets 161 x10 3/uL      RBC 4.55 (L) x10 6/uL      MCV 92.7 fL      MCH 30.5 pg      MCHC 32.9 g/dL      RDW 16 (H) %      MPV 12.4 (H) fL      Nucleated RBC 0.0 /100 WBC      Absolute NRBC 0.00 x10 3/uL     Troponin I [[id number]]  (Abnormal) Collected:  09/16/17 1429    Specimen:  Blood Updated:  09/16/17 1500     Troponin I 0.11 (H) ng/mL     Comprehensive metabolic panel [[id number]]  (Abnormal) Collected:  09/16/17 1429    Specimen:  Blood Updated:  09/16/17 1458     Glucose 200 (H) mg/dL      BUN 64.4 mg/dL      Creatinine 1.7 (H) mg/dL      Sodium 034 mEq/L      Potassium 5.2 (H) mEq/L      Chloride 108 mEq/L      CO2 19 (L) mEq/L      Calcium 9.2 mg/dL      Protein, Total 6.4 g/dL      Albumin 3.6  g/dL      AST (SGOT) 73 (H) U/L      ALT 116 (H) U/L      Alkaline Phosphatase 185 (H) U/L      Bilirubin, Total 1.5 (H) mg/dL      Globulin 2.8 g/dL      Albumin/Globulin Ratio 1.3     Anion Gap 12.0    GFR [[id number]] Collected:  09/16/17 1429     Updated:  09/16/17 1458     EGFR 48.4          Radiology Results (24 Hour)     Procedure Component Value Units Date/Time    XR Chest  AP Portable [[id number]] Collected:  09/16/17 1508    Order Status:  Completed Updated:  09/16/17 1514    Narrative:       HISTORY: Shortness of breath    COMPARISON: 08/28/2017    FINDINGS:   Cardiomegaly with triple lead left-sided pacer device in stable  position. New pulmonary edema. No pneumothorax. Small effusions not  excluded.      Impression:          Cardiomegaly with findings suggestive of pulmonary edema.    Shelly Flatten, MD   09/16/2017 3:10 PM          I have reviewed all labs and/or radiological studies. I have reviewed all xrays if any myself on the PACS system.    Clinical Impression & Disposition      Clinical Impression  Final diagnoses:   SOB (shortness of breath)   Elevated troponin I level   Acute on chronic systolic CHF (congestive heart failure)        ED Disposition     ED Disposition Condition Date/Time Comment    Observation  Thu Sep 16, 2017  4:46 PM Admitting Physician: Deloris Ping [[id number]]   Diagnosis: SOB (shortness of breath) [621308]   Estimated Length of Stay: < 2 midnights   Tentative Discharge Plan?: Home or Self Care [1]   Patient Class: Observation [104]             New Prescriptions    No medications on file                 Carney Harder [place], [place]  09/16/17 1648       Helayne Seminole, MD  09/16/17 2015

## 2017-09-16 NOTE — H&P (Signed)
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 primary prevention defibrillator which was just upgraded to a [employer] biventricular ICD device in January of 2018.  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  Normal myocardial perfusion by nuclear stress testing in September of 2017.   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  Recent AKI with decompensated CHF and resp failure with requiring CRRT transiently   Metabolic acidosis   Mild Hyperkalemia   - Monitor BMP while on diuretics   - Avoid Nephrotoxic agents   - Nephrology eval appreciated - Discussed with Dr Roswell Nickel    Elevated LFT  - Likely due to hepatic congestion  - Monitor LFT    Hypertension  - Continue Coreg  - Monitor BP    Type 2 diabetes mellitus  - On 50 units Lantus HS - reduced to 20 HS  - Sliding scale insulin      DVT Prohylaxis:heparin   Code Status: Full  Disposition:Likely cardiac rehab  Type of Admission:Observation  Estimated Length of Stay (including stay in the ER receiving treatment): 1-2 days   Milestones required  for discharge: Clinical improvement  Hospital Problems:   Active Problems:    SOB (shortness of breath)    Clinical Presentation:   History of Presenting Illness:   [name] [name] is a 71 y.o. male who has history of Multiple medical problems as mentioned above, presents with shortness of breath.   Patient was admitted from 08/22/17 through 08/30/17 for acute respiratory failure due to acute systolic congestive heart failure, AKI, treated with transient CRRT.  Patient improved with IV diuretics and sent to cardiac rehabilitation.  Patient informs he was doing well until one week ago when he started wheezing, says he is not able to lie flat and uses 2 pillows to sleep for past 4 days, for last 2 days, he is getting more short of breath, says he is uncomfortable even at rest.  Associated with substernal pressure-like chest pain off and on, improved with Tylenol.  Occasional palpitations and dizziness.  Informs his lower extremity edema is actually improving since discharge.  Denies any nausea, vomiting, abdominal pain.  Denies any fever or chills    Past Surgical History:   Procedure Laterality Date   . CARDIAC CATHETERIZATION     . CARDIAC DEFIBRILLATOR PLACEMENT     . CARDIAC PACEMAKER PLACEMENT     . CIRCUMCISION     . CORONARY STENT PLACEMENT  2011   .  duodenal ulcer  1973   . EGD N/A 08/15/2014    Procedure: EGD;  Surgeon: Colon Branch, MD;  Location: [facility];  Service: Gastroenterology;  Laterality: N/A;  egd w/ bx   . HERNIA REPAIR      [facility]   . orthopedic surgery      R foot corrective   . TONSILLECTOMY AND ADENOIDECTOMY  1954    Past Medical History:   Diagnosis Date   . AICD (automatic cardioverter/defibrillator) present 2014   . CAD (coronary artery disease)    . Cardiac defibrillator in situ    . CHF (congestive heart failure) 08/12/2014   . Diabetes mellitus    . Heart attack 2011    and 2014   . Hyperlipemia    . Hypertension    . Ischemic cardiomyopathy     20%   . Pneumonia 09/2016     came with the chief complaint of Shortness of Breath and Congestive Heart Failure    Review of Systems:   Review of Systems   Constitutional: Positive for malaise/fatigue. Negative for chills and fever.   HENT: Negative for hearing loss and tinnitus.    Respiratory: Positive for shortness of breath and wheezing.    Cardiovascular: Positive for chest pain, palpitations, leg swelling and PND.   Gastrointestinal: Negative for abdominal pain, heartburn, nausea and vomiting.   Genitourinary: Negative for dysuria and urgency.   Skin: Negative for itching and rash.   Neurological: Positive for dizziness. Negative for headaches.     Physical Exam:     Vitals:    09/16/17 1410 09/16/17 1521 09/16/17 1603 09/16/17 1633   BP: (!) 139/99  (!) 155/114 (!) 155/115   Pulse: 93  (!) 106 (!) 102   Resp: (!) 30   16   Temp: 98.7 F (37.1 C)      TempSrc: Temporal Artery      SpO2: 90% 100% 100% 100%   Weight: 104 kg (229 lb 4.5 oz)      Height: 1.829 m (6')        Physical Exam   Constitutional: He is oriented to person, place, and time. He appears distressed.   HENT:   Head: Normocephalic and atraumatic.   Eyes: Pupils are equal, round, and reactive to light. Conjunctivae are normal.   Neck: Normal range of motion. Neck supple.   Cardiovascular: Normal rate and regular rhythm.    Pulmonary/Chest: He is in respiratory distress. He has wheezes. He has rales. He exhibits no tenderness.   Abdominal: Soft. Bowel sounds are normal.   Musculoskeletal: Normal range of motion. He exhibits edema (3+).   Neurological: He is alert and oriented to person, place, and time.   Skin: Skin is warm and dry. He is not diaphoretic.   Psychiatric: Affect and judgment normal.   Nursing note and vitals reviewed.    Clinical Information and History:   Chief Complaint:  Chief Complaint   Patient presents with   . Shortness of Breath   . Congestive Heart Failure     Past Medical History:  Past Medical History:   Diagnosis Date   . AICD (automatic  cardioverter/defibrillator) present 2014   . CAD (coronary artery disease)    . Cardiac defibrillator in situ    . CHF (congestive heart failure) 08/12/2014   . Diabetes mellitus    . Heart attack 2011    and 2014   . Hyperlipemia    . Hypertension    .  Ischemic cardiomyopathy     20%   . Pneumonia 09/2016     Past Surgical History:  Past Surgical History:   Procedure Laterality Date   . CARDIAC CATHETERIZATION     . CARDIAC DEFIBRILLATOR PLACEMENT     . CARDIAC PACEMAKER PLACEMENT     . CIRCUMCISION     . CORONARY STENT PLACEMENT  2011   . duodenal ulcer  1973   . EGD N/A 08/15/2014    Procedure: EGD;  Surgeon: Colon Branch, MD;  Location: [facility];  Service: Gastroenterology;  Laterality: N/A;  egd w/ bx   . HERNIA REPAIR      [facility]   . orthopedic surgery      R foot corrective   . TONSILLECTOMY AND ADENOIDECTOMY  1954     Family History:  Family History   Problem Relation Age of Onset   . Breast cancer Mother    . Heart attack Mother 4   . Hypertension Mother    . Diabetes Mother    . Diabetes Brother    . Heart attack Father    . Hypertension Father      Social History:  History   Alcohol Use No     History   Drug Use No     History   Smoking Status   . Never Smoker   Smokeless Tobacco   . Never Used     Social History     Social History   . Marital status: Married     Spouse name: Lajoyce Corners   . Number of children: 0   . Years of education: N/A     Occupational History   . 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 Smoking status: Never Smoker   . Smokeless tobacco: Never Used   . Alcohol use No   . Drug use: No   . Sexual activity: Yes     Partners: Female     Other Topics Concern   . None     Social History Narrative   . None     Allergies:No Known Allergies  Medications:  (Not in a hospital admission)  Results of Labs/imaging   Labs have been reviewed:   Coagulation Profile:       CBC review:   Recent Labs  Lab 09/16/17  1429   WBC 11.21*   Hgb 13.9   Hematocrit 42.2    Platelets 161   MCV 92.7   RDW 16*   Segmented Neutrophils 33     Chem Review:  Recent Labs  Lab 09/16/17  1429   Sodium 139   Potassium 5.2*   Chloride 108   CO2 19*   BUN 23.9   Creatinine 1.7*   Glucose 200*   Calcium 9.2   Bilirubin, Total 1.5*   AST (SGOT) 73*   ALT 116*   Alkaline Phosphatase 185*     Results     Procedure Component Value Units Date/Time    Arterial Blood Gas (ABG) [[id number]]  (Abnormal) Collected:  09/16/17 1528     Updated:  09/16/17 1542     ABG CollectionSite Right Radl     Allen's Test No PT NOT ABLE     Temperature 98.6     ARTERIAL PH CORRECTED 7.40     ARTERIAL PCO2 CORRECTED 38.0 mmHg      Arterial pO2 Corrected 171.0 (H) mmHg      pH,  Arterial 7.40     pCO2, Arterial 38.0 mmHg      pO2, Arterial 171.0 (H) mmHg      HCO3, Arterial 23.5 mEq/L      Base Excess, Arterial -1.1 mEq/L      O2 Sat, Arterial 99.5 %      FIO2 40.0 %      O2 Delivery BiPAP     Rate 12 BPM      BIPAP(I) 10.0 cmH2O      BIPAP(E) 5.0 cmH2O     B-type Natriuretic Peptide [[id number]]  (Abnormal) Collected:  09/16/17 1429    Specimen:  Blood Updated:  09/16/17 1526     B-Natriuretic Peptide 1,792.4 (H) pg/mL     Manual Differential [[id number]]  (Abnormal) Collected:  09/16/17 1429     Updated:  09/16/17 1504     Segmented Neutrophils 33 %      Band Neutrophils 0 %      Lymphocytes Manual 64 %      Monocytes Manual 2 %      Eosinophils Manual 0 %      Basophils Manual 1 %      Abs Seg Manual 3.70 x10 3/uL      Bands Absolute 0.00 x10 3/uL      Absolute Lymph Manual 7.17 (H) x10 3/uL      Monocytes Absolute 0.22 x10 3/uL      Absolute Eos Manual 0.00 x10 3/uL      Absolute Baso Manual 0.11 x10 3/uL     Cell MorpHology [[id number]]  (Abnormal) Collected:  09/16/17 1429     Updated:  09/16/17 1504     Cell Morphology: Abnormal (A)     Platelet Estimate Normal     Polychromasia Present (A)     Dual RBC population Present (A)     Platelet Clumps Present (A)    CBC with differential [[id number]]  (Abnormal) Collected:   09/16/17 1429    Specimen:  Blood from Blood Updated:  09/16/17 1504     WBC 11.21 (H) x10 3/uL      Hgb 13.9 g/dL      Hematocrit 62.9 %      Platelets 161 x10 3/uL      RBC 4.55 (L) x10 6/uL      MCV 92.7 fL      MCH 30.5 pg      MCHC 32.9 g/dL      RDW 16 (H) %      MPV 12.4 (H) fL      Nucleated RBC 0.0 /100 WBC      Absolute NRBC 0.00 x10 3/uL     Troponin I [[id number]]  (Abnormal) Collected:  09/16/17 1429    Specimen:  Blood Updated:  09/16/17 1500     Troponin I 0.11 (H) ng/mL     Comprehensive metabolic panel [[id number]]  (Abnormal) Collected:  09/16/17 1429    Specimen:  Blood Updated:  09/16/17 1458     Glucose 200 (H) mg/dL      BUN 64.4 mg/dL      Creatinine 1.7 (H) mg/dL      Sodium 034 mEq/L      Potassium 5.2 (H) mEq/L      Chloride 108 mEq/L      CO2 19 (L) mEq/L      Calcium 9.2 mg/dL      Protein, Total 6.4 g/dL      Albumin 3.6 g/dL      AST (SGOT) 73 (  H) U/L      ALT 116 (H) U/L      Alkaline Phosphatase 185 (H) U/L      Bilirubin, Total 1.5 (H) mg/dL      Globulin 2.8 g/dL      Albumin/Globulin Ratio 1.3     Anion Gap 12.0    GFR [[id number]] Collected:  09/16/17 1429     Updated:  09/16/17 1458     EGFR 48.4        Radiology reports have been reviewed:  Radiology Results (24 Hour)     Procedure Component Value Units Date/Time    XR Chest  AP Portable [[id number]] Collected:  09/16/17 1508    Order Status:  Completed Updated:  09/16/17 1514    Narrative:       HISTORY: Shortness of breath    COMPARISON: 08/28/2017    FINDINGS:   Cardiomegaly with triple lead left-sided pacer device in stable  position. New pulmonary edema. No pneumothorax. Small effusions not  excluded.      Impression:          Cardiomegaly with findings suggestive of pulmonary edema.    Shelly Flatten, MD   09/16/2017 3:10 PM        EKG: EKG reviewed   Last EKG Result     Procedure Component Value Units Date/Time    ECG 12 lead [[id number]] Collected:  09/16/17 1409     Updated:  09/16/17 1412     Ventricular Rate 101 BPM       Atrial Rate 101 BPM      P-R Interval 116 ms      QRS Duration 98 ms      Q-T Interval 394 ms      QTC Calculation (Bezet) 510 ms      P Axis 62 degrees      R Axis -13 degrees      T Axis 95 degrees     Narrative:        Suspect unspecified pacemaker failure  Atrial-sensed ventricular-paced rhythm  ABNORMAL ECG  WHEN COMPARED WITH ECG OF 24-Aug-2017 08:34,  VENTRICULAR RATE HAS INCREASED BY  29 BPM    EKG SCAN [[id number]] Resulted:  09/16/17 1412     Updated:  09/16/17 1412           Hospitalist   Signed by:   Deloris Ping  09/16/2017 4:52 PM    *This note was generated by the Epic EMR system/ Dragon speech recognition and may contain inherent errors or omissions not intended by the user. Grammatical errors, random word insertions, deletions, pronoun errors and incomplete sentences are occasional consequences of this technology due to software limitations. Not all errors are caught or corrected. If there are questions or concerns about the content of this note or information contained within the body of this dictation they should be addressed directly with the author for clarification

## 2017-09-16 NOTE — Consults (Signed)
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 male who presents to the hospital on 09/16/2017 with hx of dilated CM with EF of 20% followed by [facility] recently admitted with acute resp failure and AKI secondary to decompensated HF presented with progressive shortness of breath and now in reps failure and AKI again.He had needed CRRT for anuric AKI in recent admission 4 weeks ago and recovered ,discharged on lasix 40 mg daily to rehab.     Past Medical History:     Past Medical History:   Diagnosis Date   . AICD (automatic cardioverter/defibrillator) present 2014   . CAD (coronary artery disease)    . Cardiac defibrillator in situ    . CHF (congestive heart failure) 08/12/2014   . Diabetes mellitus    . Heart attack 2011    and 2014   . Hyperlipemia    . Hypertension    . Ischemic cardiomyopathy      20%   . Pneumonia 09/2016       Past Surgical History:     Past Surgical History:   Procedure Laterality Date   . CARDIAC CATHETERIZATION     . CARDIAC DEFIBRILLATOR PLACEMENT     . CARDIAC PACEMAKER PLACEMENT     . CIRCUMCISION     . CORONARY STENT PLACEMENT  2011   . duodenal ulcer  1973   . EGD N/A 08/15/2014    Procedure: EGD;  Surgeon: Colon Branch, MD;  Location: [facility];  Service: Gastroenterology;  Laterality: N/A;  egd w/ bx   . HERNIA REPAIR      [facility]   . orthopedic surgery      R foot corrective   . TONSILLECTOMY AND ADENOIDECTOMY  1954       Family History:     Family History   Problem Relation Age of Onset   . Breast cancer Mother    . Heart attack Mother 64   . Hypertension Mother    . Diabetes Mother    . Diabetes Brother    . Heart attack Father    . Hypertension Father        Social History:     Social History     Social History   .  Marital status: Married     Spouse name: Lajoyce Corners   . Number of children: 0   . Years of education: N/A     Occupational History   . 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 Smoking status: Never Smoker   . Smokeless tobacco: Never Used   . Alcohol use No   . Drug use: No   . Sexual activity: Yes     Partners: Female     Other Topics Concern   . Not on file     Social History Narrative   . No narrative on file       Allergies:   No Known Allergies    Medications:     Current Facility-Administered Medications   Medication Dose Route Frequency       Review of Systems:   A comprehensive review of systems was: as in HPI    Physical Exam:     Vitals:    09/16/17 1633   BP: (!) 155/115   Pulse: (!) 102   Resp: 16   Temp:    SpO2: 100%       Intake and Output Summary (Last 24 hours) at Date Time  No intake or output data in the 24 hours ending 09/16/17 1713    Alert,anxious and moderate dsitress   tachypneic  JVD+  HJR+  Chest:rales and decreased BS at bases  ZOX:WRUEA  -VWU:JWJX NTND  Ext:1+edema    Labs Reviewed:     Results      Procedure Component Value Units Date/Time    Arterial Blood Gas (ABG) [[id number]]  (Abnormal) Collected:  09/16/17 1528     Updated:  09/16/17 1542     ABG CollectionSite Right Radl     Allen's Test No PT NOT ABLE     Temperature 98.6     ARTERIAL PH CORRECTED 7.40     ARTERIAL PCO2 CORRECTED 38.0 mmHg      Arterial pO2 Corrected 171.0 (H) mmHg      pH, Arterial 7.40     pCO2, Arterial 38.0 mmHg      pO2, Arterial 171.0 (H) mmHg      HCO3, Arterial 23.5 mEq/L      Base Excess, Arterial -1.1 mEq/L      O2 Sat, Arterial 99.5 %      FIO2 40.0 %      O2 Delivery BiPAP     Rate 12 BPM      BIPAP(I) 10.0 cmH2O      BIPAP(E) 5.0 cmH2O     B-type Natriuretic Peptide [[id number]]  (Abnormal) Collected:  09/16/17 1429    Specimen:  Blood Updated:  09/16/17 1526     B-Natriuretic Peptide 1,792.4 (H) pg/mL     Manual Differential [[id number]]  (Abnormal) Collected:  09/16/17 1429     Updated:  09/16/17 1504     Segmented Neutrophils 33 %      Band Neutrophils 0 %      Lymphocytes Manual 64 %      Monocytes Manual 2 %      Eosinophils Manual 0 %      Basophils Manual 1 %      Abs Seg Manual 3.70 x10 3/uL      Bands Absolute 0.00 x10 3/uL      Absolute Lymph Manual 7.17 (H) x10 3/uL      Monocytes Absolute 0.22 x10 3/uL      Absolute Eos  Manual 0.00 x10 3/uL      Absolute Baso Manual 0.11 x10 3/uL     Cell MorpHology [[id number]]  (Abnormal) Collected:  09/16/17 1429     Updated:  09/16/17 1504     Cell Morphology: Abnormal (A)     Platelet Estimate Normal     Polychromasia Present (A)     Dual RBC population Present (A)     Platelet Clumps Present (A)    CBC with differential [[id number]]  (Abnormal) Collected:  09/16/17 1429    Specimen:  Blood from Blood Updated:  09/16/17 1504     WBC 11.21 (H) x10 3/uL      Hgb 13.9 g/dL      Hematocrit 47.4 %      Platelets 161 x10 3/uL      RBC 4.55 (L) x10 6/uL      MCV 92.7 fL      MCH 30.5 pg      MCHC 32.9 g/dL      RDW 16 (H) %      MPV 12.4 (H) fL      Nucleated RBC 0.0 /100 WBC       Absolute NRBC 0.00 x10 3/uL     Troponin I [[id number]]  (Abnormal) Collected:  09/16/17 1429    Specimen:  Blood Updated:  09/16/17 1500     Troponin I 0.11 (H) ng/mL     Comprehensive metabolic panel [[id number]]  (Abnormal) Collected:  09/16/17 1429    Specimen:  Blood Updated:  09/16/17 1458     Glucose 200 (H) mg/dL      BUN 84.1 mg/dL      Creatinine 1.7 (H) mg/dL      Sodium 660 mEq/L      Potassium 5.2 (H) mEq/L      Chloride 108 mEq/L      CO2 19 (L) mEq/L      Calcium 9.2 mg/dL      Protein, Total 6.4 g/dL      Albumin 3.6 g/dL      AST (SGOT) 73 (H) U/L      ALT 116 (H) U/L      Alkaline Phosphatase 185 (H) U/L      Bilirubin, Total 1.5 (H) mg/dL      Globulin 2.8 g/dL      Albumin/Globulin Ratio 1.3     Anion Gap 12.0    GFR [[id number]] Collected:  09/16/17 1429     Updated:  09/16/17 1458     EGFR 48.4            Rads:   Radiological Procedure reviewed.     Signed by:  Eddie North, M.D  [facility] of [phone]

## 2017-09-16 NOTE — ED Provider Notes (Signed)
ATTENDING : Helayne Seminole, MD. I HAVE PERSONALLY SEEN AND EXAMINED PATIENT. I have personally obtained a history, performed a focused PE, and participated in the medical decision making of this patient.     I have discussed and agree with the care plan of the Advanced Care Provider.    Brief Note - sob x 4-5 days. Getting worse.    Focal exam - CV - S1S2, no murmur, distal pulses intact.  Lungs- clear to ausculation bilaterally, Abd- nontender, ND. Neuro exam has no focal weakness.    Pt has periods of gasping respiration.       Helayne Seminole, MD  09/16/17 [phone]

## 2017-09-16 NOTE — Progress Notes (Signed)
Was called earlier for 8B NSVT- checked lytes from this afternoon which were normal. Was called again for 12B NSVT, pt asymp, came to see pt. He is currentlly sleeping on bipap. When the event occurred, he was lying on bed off bipap. He denies any compl. He is S/P AICD.     Plan:   -Multiple etio for his NSVT- CHF, hypoxia, ? Ischemia- check trops.   -recheck lytes  -Cont pulse ox with tele.    -Spioke with RN. Will monitor closely.

## 2017-09-16 NOTE — Consults (Signed)
[facility] CARDI[facility] CONSULTATION REPORT  [facility]    Date Time: 09/16/17 1:58 PM  Patient Name: [name]  Requesting Physician: No att. providers found       Reason for Consultation:   CHF      History:   Uriyah Raska is a 71 y.o. male admitted on 09/16/2017, for whom we are asked to provide cardiac consultation, regarding acute on chronic systolic CHF. Pt presented to our office today as a hospital follow up. Pt was recently hospitalized for acute on chronic systolic CHF, sepsis, acute respiratory failure and A/CKD requiring CRRT. Pt was d/c to rehab and has been on Lasix 40 mg daily. Over the past 4 days pt has noticed worsening SOB, LE edema, and fatigue. Pt is currently SOB talking with me. O2 sats are in the low 90s with a stable BP and sinus tach noted on EKG. Pt's weight is up 12 lbs from last OV. Pt admits to feeling very unwell.      Past Medical History:     Past Medical History:   Diagnosis Date   . AICD (automatic cardioverter/defibrillator) present 2014   . CAD (coronary artery disease)    . Cardiac defibrillator in situ    . CHF (congestive heart failure) 08/12/2014   . Diabetes mellitus    . Heart attack 2011    and 2014   . Hyperlipemia    . Hypertension    . Ischemic cardiomyopathy     20%   . Pneumonia 09/2016       Past Surgical History:     Past Surgical History:   Procedure Laterality Date   . CARDIAC CATHETERIZATION     . CARDIAC DEFIBRILLATOR PLACEMENT     . CARDIAC PACEMAKER PLACEMENT     . CIRCUMCISION     . CORONARY STENT PLACEMENT  2011   . duodenal ulcer  1973   . EGD N/A 08/15/2014    Procedure: EGD;  Surgeon: Colon Branch, MD;  [facility];  Service: Gastroenterology;  Laterality: N/A;  egd w/ bx   . HERNIA REPAIR      [facility]   . orthopedic surgery      R foot corrective   . TONSILLECTOMY AND ADENOIDECTOMY  1954       Family History:     Family History   Problem Relation Age of Onset   . Breast cancer Mother    . Heart attack Mother  37   . Hypertension Mother    . Diabetes Mother    . Diabetes Brother    . Heart attack Father    . Hypertension Father        Social History:     Social History     Social History   . Marital status: Married     Spouse name: Lajoyce Corners   . Number of children: 0   . Years of education: N/A     Occupational History   . 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 Smoking status: Never Smoker   . Smokeless tobacco: Never Used   . Alcohol use No   . Drug use: No   . Sexual activity: Yes     Partners: Female     Other Topics Concern   . Not on file     Social History Narrative   . No narrative on file       Allergies:  No Known Allergies    Medications:     (Not in a hospital admission)      Current Facility-Administered Medications   Medication Dose Route Frequency Provider Last Rate Last Dose   . levalbuterol (XOPENEX) nebulizer solution 1.25 mg  1.25 mg Nebulization TID Zorita Pang, MD         Current Outpatient Prescriptions   Medication Sig Dispense Refill   . amitriptyline (ELAVIL) 25 MG tablet amitriptyline 25 mg tablet   Take 1 tablet every day by oral route.     Marland Kitchen atorvastatin (LIPITOR) 40 MG tablet Take 1 tablet (40 mg total) by mouth nightly. 30 tablet 0   . bisacodyl (DULCOLAX) 5 MG EC tablet Take 2 tablets (10 mg total) by mouth daily as needed for Constipation. 20 tablet 0   . carvedilol (COREG) 6.25 MG tablet Take 1 tablet (6.25 mg total) by mouth every 12 (twelve) hours. 30 tablet 0   . esomeprazole (NEXIUM) 40 MG capsule      . fluticasone-salmeterol (ADVAIR DISKUS) 100-50 MCG/DOSE Aerosol Powder, Breath Activtivatede Inhale 1 puff into the lungs 2 (two) times daily. 1 each 2   . furosemide (LASIX) 40 MG tablet Take 1 tablet (40 mg total) by mouth daily. 30 tablet 0   . glucose blood (ONE TOUCH ULTRA TEST) test strip 1 each by Other route 2 (two) times daily.Use as instructed 300 each 1   . hydrocodone-chlorpheniramine (TUSSIONEX) 10-8 MG/5ML suspension Take 5 mLs by mouth 2 (two)  times daily. 115 mL 0   . insulin glargine (LANTUS) 100 UNIT/ML injection Inject 50 Units into the skin nightly. 10 mL 0   . niacin (NIASPAN) 500 MG CR tablet niacin ER 500 mg tablet,extended release 24 hr     . predniSONE (DELTASONE) 10 MG tablet Prednisone 20 mg po q day ,taper decreasing by 10 mg every 3 days until its off 9 tablet 0   . SYMBICORT 80-4.5 MCG/ACT inhaler      . tamsulosin (FLOMAX) 0.4 MG Cap Take 1 capsule (0.4 mg total) by mouth Daily after dinner. 30 capsule 0         Review of Systems:    Comprehensive review of systems including constitutional, eyes, ears, nose, mouth, throat, cardiovascular, GI, GU, musculoskeletal, integumentary, respiratory, neurologic, psychiatric, and endocrine is negative other than what is mentioned already in the history of present illness    Physical Exam:   There were no vitals filed for this visit.  No data recorded.      Telemetry/EKG    Intake and Output Summary (Last 24 hours) at Date Time  No intake or output data in the 24 hours ending 09/16/17 1358     GENERAL: Patient is in no acute distress   HEENT: No scleral icterus or conjunctival pallor, moist mucous membranes   NECK: + JVD  CARDIAC: Normal apical impulse, regular rate and rhythm, tachy with normal S1 and S2, and no murmurs, rubs, or gallops   CHEST: RLL diminished lung sounds, LLL crackles, increased respiratory effort  ABDOMEN: soft   EXTREMITIES: +2 BL LE edema  SKIN: No rash or jaundice   NEUROLOGIC: Alert and oriented to time, place and person, normal mood and affect    MUSCULOSKELETAL: Normal muscle strength and tone.      Labs Reviewed:  CrCl cannot be calculated (Unknown ideal weight.).        Radiology        chest X-ray- pending   Assessment:    Acute on chronic systolic CHF, significantly volume overloaded.   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   Normal myocardial perfusion by nuclear stress testing in September of 2017.    Status post St. Jude primary prevention defibrillator which was just upgraded to a Medtronic     biventricular ICD device in January of 2018.   History of syncope in the past that has never been proven to represent ventricular arrhythmias     by device interrogations.  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    Hypertension, well controlled.    Type 2 diabetes mellitus, on insulin.    CKD, most recent admission pt recieved CRRT therapy.     Recommendations:    Admit for IV diuresis   Recommend nephrology input, as pt needed CRRT during last admission.   Low salt   Fluid restriction 1.5 L   Daily weights    I have reviewed the notes, assessments, and/or procedures performed by Demetrius Charity, I concur with her/his documentation of Heinrich Fertig.  Pt seen + le edema  Cont diuresis.  Bary Richard MD    Signed by: Wenda Overland, PA-C      [facility]  NP Spectralink [phone] (8am-5pm)  MD Spectralink [phone] (8am-5pm)  After hours, non urgent consult line [phone]  After Hours, urgent consults [phone]

## 2017-09-16 NOTE — UM Notes (Signed)
UR Review 09/16/17    71 yr old presented to ED on 09/16/17 and placed on OBS status @ 1646 for SOB        MD Summary:      Assessment:  sob x 4-5 days. Getting worse.    Focal exam - CV - S1S2, no murmur, distal pulses intact.  Lungs- clear to ausculation bilaterally, Abd- nontender, ND. Neuro exam has no focal weakness.    Pt has periods of gasping respiration.    VS: t 98.7, RR 106, RR 30, 155/115    Labs: wbc 11.21, rbc 4.55, glucose 200, cr 1.7, K 5.2, co2 19, AST 73, ALT 116, alk phos 185, bili 1.5, BNP 1792.4, troponin 0.111    EKG:  Atrial-sensed ventricular-paced rhythm  ABNORMAL ECG    CXR: Cardiomegaly with findings suggestive of pulmonary edema.    ED Meds:  Lasix 40mg  IV    DX:  SOB (shortness of breath)    Elevated troponin I level    Acute on chronic systolic CHF (congestive heart failure)          Cardiology consult:  Assessment:    Acute on chronic systolic CHF, significantly volume overloaded.   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   Normal myocardial perfusion by nuclear stress testing in September of 2017.    Status post St. Jude primary prevention defibrillator which was just upgraded to a Medtronic                biventricular ICD device in January of 2018.   History of syncope in the past that has never been proven to represent ventricular arrhythmias                by device interrogations.  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    Hypertension, well controlled.    Type 2 diabetes mellitus, on insulin.    CKD, most recent admission pt recieved CRRT therapy.     Recommendations:    Admit for IV diuresis   Recommend nephrology input, as pt needed CRRT during last admission.   Low salt   Fluid restriction 1.5 L   Daily weights

## 2017-09-16 NOTE — Plan of Care (Signed)
Problem: Moderate/High Fall Risk Score >5  Goal: Patient will remain free of falls  Outcome: Progressing   09/16/17 2200   OTHER   Moderate Risk (6-13) MOD-Consider activation of bed alarm if appropriate;MOD-Use of chair-pad alarm when appropriate;MOD-Remain with patient during toileting;MOD-Place Fall Risk level on whiteboard in room       Problem: Safety  Goal: Patient will be free from injury during hospitalization  Outcome: Progressing   09/16/17 2303   Goal/Interventions addressed this shift   Patient will be free from injury during hospitalization  Assess patient's risk for falls and implement fall prevention plan of care per policy;Provide and maintain safe environment;Use appropriate transfer methods;Ensure appropriate safety devices are available at the bedside;Include patient/ family/ care giver in decisions related to safety;Hourly rounding       Problem: Pain  Goal: Pain at adequate level as identified by patient  Outcome: Progressing   09/16/17 2303   Goal/Interventions addressed this shift   Pain at adequate level as identified by patient Identify patient comfort function goal;Assess for risk of opioid induced respiratory depression, including snoring/sleep apnea. Alert healthcare team of risk factors identified.;Assess pain on admission, during daily assessment and/or before any "as needed" intervention(s);Reassess pain within 30-60 minutes of any procedure/intervention, per Pain Assessment, Intervention, Reassessment (AIR) Cycle;Evaluate patient's satisfaction with pain management progress;Evaluate if patient comfort function goal is met;Offer non-pharmacological pain management interventions

## 2017-09-16 NOTE — ED Notes (Signed)
Isolation in rehab center needs 1 assist

## 2017-09-16 NOTE — ED Triage Notes (Signed)
Patient brought in to the ED for SOB and CHF. He was seen by his cardiologist and brought to the ED for the increasing SOB. Patients room air sat read 87% on room air. Patients respiration are in the 30's. Patients HR 101, patient screen SEPSIS Positive. Denies any pain, denies any SI/HI or any weapons.

## 2017-09-16 NOTE — Plan of Care (Addendum)
RT called Dr Welton Flakes with concerns regarding the episodes of apnea, and possibly reconsidering patient for IMC/ICU, Dr Welton Flakes will continue to admit patient to PCU

## 2017-09-16 NOTE — ED Notes (Signed)
Family called by RN

## 2017-09-17 LAB — PT AND APTT
PT INR: 1.4 — ABNORMAL HIGH (ref 0.9–1.1)
PT: 17 s — ABNORMAL HIGH (ref 12.6–15.0)
PTT: 31 s (ref 23–37)

## 2017-09-17 LAB — BASIC METABOLIC PANEL
Anion Gap: 10 (ref 5.0–15.0)
BUN: 20.2 mg/dL (ref 9.0–28.0)
CO2: 24 mEq/L (ref 22–29)
Calcium: 8.9 mg/dL (ref 7.9–10.2)
Chloride: 107 mEq/L (ref 100–111)
Creatinine: 1.3 mg/dL (ref 0.7–1.3)
Glucose: 127 mg/dL — ABNORMAL HIGH (ref 70–100)
Potassium: 4 mEq/L (ref 3.5–5.1)
Sodium: 141 mEq/L (ref 136–145)

## 2017-09-17 LAB — ECG 12-LEAD
Atrial Rate: 101 {beats}/min
Atrial Rate: 93 {beats}/min
P Axis: 62 degrees
P Axis: 67 degrees
P-R Interval: 116 ms
P-R Interval: 140 ms
Q-T Interval: 394 ms
Q-T Interval: 394 ms
QRS Duration: 98 ms
QRS Duration: 88 ms
QTC Calculation (Bezet): 510 ms
QTC Calculation (Bezet): 489 ms
R Axis: -13 degrees
R Axis: 7 degrees
T Axis: 95 degrees
T Axis: 111 degrees
Ventricular Rate: 101 {beats}/min
Ventricular Rate: 93 {beats}/min

## 2017-09-17 LAB — CBC PATHOLOGIST REVIEW

## 2017-09-17 LAB — GFR: EGFR: >60

## 2017-09-17 LAB — CBC
Absolute NRBC: 0 10*3/uL
Hematocrit: 39.5 % — ABNORMAL LOW (ref 42.0–52.0)
Hgb: 13.2 g/dL (ref 13.0–17.0)
MCH: 30.4 pg (ref 28.0–32.0)
MCHC: 33.4 g/dL (ref 32.0–36.0)
MCV: 91 fL (ref 80.0–100.0)
MPV: 12.4 fL — ABNORMAL HIGH (ref 9.4–12.3)
Nucleated RBC: 0 /100 WBC (ref 0.0–1.0)
Platelets: 143 10*3/uL (ref 140–400)
RBC: 4.34 10*6/uL — ABNORMAL LOW (ref 4.70–6.00)
RDW: 16 % — ABNORMAL HIGH (ref 12–15)
WBC: 8.14 10*3/uL (ref 3.50–10.80)

## 2017-09-17 LAB — GLUCOSE WHOLE BLOOD - POCT
Whole Blood Glucose POCT: 151 mg/dL — ABNORMAL HIGH (ref 70–100)
Whole Blood Glucose POCT: 226 mg/dL — ABNORMAL HIGH (ref 70–100)
Whole Blood Glucose POCT: 294 mg/dL — ABNORMAL HIGH (ref 70–100)

## 2017-09-17 LAB — TROPONIN I
Troponin I: 0.1 ng/mL — ABNORMAL HIGH (ref 0.00–0.09)
Troponin I: 0.11 ng/mL — ABNORMAL HIGH (ref 0.00–0.09)

## 2017-09-17 LAB — MAGNESIUM
Magnesium: 1.5 mg/dL — ABNORMAL LOW (ref 1.6–2.6)
Magnesium: 2.2 mg/dL (ref 1.6–2.6)

## 2017-09-17 LAB — POTASSIUM: Potassium: 3.8 mEq/L (ref 3.5–5.1)

## 2017-09-17 MED ORDER — MAGNESIUM SULFATE IN D5W 1-5 GM/100ML-% IV SOLN
1.0000 g | INTRAVENOUS | Status: AC
Start: 2017-09-17 — End: 2017-09-17
  Administered 2017-09-17 (×2): 1 g via INTRAVENOUS
  Filled 2017-09-17: qty 200

## 2017-09-17 NOTE — Progress Notes (Signed)
At 2030 Patient  Admitted from ED  To  Room 203 With  Dx  CHF Exacerbation.Patient was on BIPAP . Patient  Alert & oriented x3. Patient  & Family Oriented to room & call light system.Denies Pain. Vss.Tele Applied  V-paced  On Monitor. Patient  has Frequent  V-tach. Dr Audry Pili  Notify. Recheck electrolytes.  Magnesium Low Replaced..Patient continue on Pulse- ox. Safety Maintained Per unit protocol.Call bell within reach.bed in low position.bed alarm on.

## 2017-09-17 NOTE — Plan of Care (Signed)
Problem: Psychosocial and Spiritual Needs  Goal: Demonstrates ability to cope with hospitalization/illness  Outcome: Progressing

## 2017-09-17 NOTE — UM Notes (Signed)
Hello    REF# [id number]  Admission clinicals 09/16/17 - 09/17/17 (OBS admit order placed in error on 09/16/17 per MD - shoud have been ord'd inpatient 09/16/17 -  INPATIENT order placed 09/17/17)  C/b to [name] [phone]    Thank-you!        71 yo male to ED 09/16/17 1356. Patient brought in to the ED for SOB and CHF. He was seen by his cardiologist and brought to the ED for the increasing SOB. Patients room air sat read 87% on room air. Patients respiration are in the 30's. Patients HR 101, patient screen SEPSIS Positive.  Height : 6' / Weight : 216 lbs  98.7, HR 106-102, RR 30, BP 139/99 - 155/115 - 142/104, sats 87% RA    Past Medical History:   Diagnosis Date   . AICD (automatic cardioverter/defibrillator) present 2014   . CAD (coronary artery disease)    . Cardiac defibrillator in situ    . CHF (congestive heart failure) 08/12/2014   . Diabetes mellitus    . Heart attack 2011    and 2014   . Hyperlipemia    . Hypertension    . Ischemic cardiomyopathy     20%   . Pneumonia 09/2016     WBC 11.21, RBC 4.55, Glucose 200, Cr 1.7, K 5.2, Co2 19, AST 73, ALT 116, Alk Phos 185, Bili total 1.5, BNP 1792.4, Trop .11  ABGs : art pO2 171.0  CXRY : Cardiomegaly with findings suggestive of pulmonary edema.  ABN EKG : Suspect unspecified pacemaker failure Atrial-sensed ventricular-paced rhythm  On exam : accessory muscle usage, tachypneic, 2+ pitting edema bilat, periods of gasping respirations  In ED : 40mg  IV Lasix, 60mg  IV Lasix, BiPap initiated PEEP 5 FiO2 40%  From ED to TELE  Admit : Acute on chronic systolicCHF Exacerbation, significantly volume overloaded with respiratory insuff : tachypnea and hypoxemia    MD assessment/plan :  Acute on chronic systolicCHF Exacerbation, significantly volume overloaded.  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 Jude primary prevention defibrillator which was just upgraded to a  Medtronic biventricular ICD device in January of 2018.  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  Normal myocardial perfusion by nuclear stress testing in September of 2017.   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  Recent AKI with decompensated CHF and resp failure with requiring CRRT transiently   Metabolic acidosis   Mild Hyperkalemia   - Monitor BMP while on diuretics   - Avoid Nephrotoxic agents   - Nephrology eval appreciated - Discussed with Dr Roswell Nickel  Elevated LFT  - Likely due to hepatic congestion  - Monitor LFT  Hypertension  - Continue Coreg  - Monitor BP  Type 2 diabetes mellitus  - On 50 units Lantus HS - reduced to 20 HS  - Sliding  scale insulin  DVT Prohylaxis:heparin     OBS admit order placed in error on 09/16/17 - INPATIENT order placed 09/17/17, ICU, po Elavil, Lipitor, Coreg, Advair IN BID, 60mg  IV Lasix TID, 5000units SQ Heparin q12, SC Lantus 20units qHS, po Niacin, Protonix, Flomax, prn Tylenol, prn ALB neb, SS Insulin, Cardio, Nephro, fluid rest 1.5L, daily weights, PULM        Cardio consult 09/16/17 :  Assessment:    Acute on chronic systolic CHF, significantly volume overloaded.   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   Normal myocardial perfusion by nuclear stress testing in September of 2017.    Status post St. Jude primary  prevention defibrillator which was just upgraded to a Medtronic                biventricular ICD device in January of 2018.   History of syncope in the past that has never been proven to represent ventricular arrhythmias                by device interrogations.  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    Hypertension, well controlled.    Type 2 diabetes mellitus, on insulin.    CKD, most recent admission pt recieved CRRT therapy.   Recommendations:    Admit for IV diuresis   Recommend nephrology input, as pt needed CRRT during last admission.   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 He was noted to have long apneas especially when he doses off -- he was placed on BPAP and critical Care consult is kindly requested .   Pt is aao / afebrile/labs checked  CXR suggestive of CHF   Assessment:     Hypoxia   Apneas   Severe Cardiomyopathy / CHF   AKI /  CKD  OSA  Plan:n:     Pt on BPAP  ABG on NC reviewed  ( noted BPAP incomputer-Nurse told me it was NC )   Pt probably has CSB with apneas noted by staff even while he is awake -- and especially worse when he doses off   Pt is awake now   On BPAP   Breathing is not labored .  Oxygenating OK   He can be admitted on Cardiac tele with BPAP while asleep   Consult pts Pulmonologist   D/.w ER staff/ Doctor           To tele 09/17/17 430am :   Admitted from ED  To  Room 203 With  Dx  CHF Exacerbation.  Patient was on BIPAP .   Patient  Alert & oriented x3.  Patient  has Frequent  V-tach. Dr Audry Pili  Notify. Recheck electrolytes.  Magnesium Low (1.5) Replaced.  Patient continue on Pulse- ox.       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  Did not have Cheyne-Stokes breathing.  Plan:   I spoke with the respiratory therapy .The patient is on BiPAP 15/5 with backup rate of 12 and 30% FiO2.  Patient definitely needs to have backup rate on the BiPAP.  Patient did not have obstructive event and this setting last night.  We will continue the current setting wall being admitted in the hospital.  I suggest to schedule patient for BiPAP titration as an outpatient instead of CPAP titration .  With ejection fraction of 30% ,  patient is not a candidate off ASV titration.      Per Cardio 09/17/17 :  Assessment:      Acute on chronic systolic CHF, significantly volume overloaded.   Flat troponin secondary to demand ischemia, not ACS   CKD, most recent admission pt recieved CRRT therapy.  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  ? Normal myocardial perfusion by nuclear stress testing in September of 2017.    Status post St. Jude primary prevention defibrillator which was just upgraded to a Medtronic biventricular ICD device in January of 2018.   History of syncope in the past that has never been proven to represent ventricular arrhythmias by device interrogations. 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    Hypertension, well controlled.    Type 2 diabetes mellitus, on insulin.     Recommendations:    Continue IV diuresis   Follow up LFTs and BNP in AM   Long discussion with patient regarding fluid restriction and Na+ restriction.  (patient asking questions about [employer] and [employer]) needs a great deal of education regarding this.  Will place a nutrition consult today    Please change to daily standing weights

## 2017-09-17 NOTE — Progress Notes (Addendum)
[name] [name] HEART  PROGRESS NOTE  [facility]    Date Time: 09/17/17 10:13 AM  Patient Name: [name] [name]           Assessment:      Acute on chronic systolic CHF, significantly volume overloaded.   Flat troponin secondary to demand ischemia, not ACS   CKD, most recent admission pt recieved CRRT therapy.  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   Normal myocardial perfusion by nuclear stress testing in September of 2017.    Status post [employer] Jude primary prevention defibrillator which was just upgraded to a Medtronic biventricular ICD device in January of 2018.   History of syncope in the past that has never been proven to represent ventricular arrhythmias by device interrogations.  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    Hypertension, well controlled.    Type 2 diabetes mellitus, on insulin.     Recommendations:    Continue IV diuresis   Follow up LFTs and BNP in AM   Long discussion with patient regarding fluid restriction and Na+ restriction.  (patient asking questions about [employer] and [employer]) needs a great deal of education regarding this.  Will place a nutrition consult today    Please change to daily standing weights       Medications:      Scheduled Meds: PRN Meds:      amitriptyline 25 mg Oral QHS   atorvastatin 40 mg Oral QHS   carvedilol 6.25 mg Oral Q12H SCH   fluticasone-salmeterol 1 puff Inhalation BID   furosemide 60 mg Intravenous TID   heparin (porcine) 5,000 Units Subcutaneous Q12H SCH   insulin glargine 20 Units Subcutaneous QHS   niacin 500 mg Oral QHS   pantoprazole 40 mg Oral QAM AC   tamsulosin 0.4 mg Oral QD after dinner       Continuous Infusions:     acetaminophen 650 mg Q4H PRN   albuterol 2.5 mg BID PRN   bisacodyl 10 mg QD PRN   dextrose 15 g of  glucose PRN   And     dextrose 12.5 g PRN   And     glucagon (rDNA) 1 mg PRN   insulin lispro 1-5 Units TID AC PRN             Subjective:   Denies chest pain, SOB or palpitations.      Physical Exam:     Vitals:    09/17/17 0503 09/17/17 0802 09/17/17 0805 09/17/17 0913   BP: (!) 130/92 126/90     Pulse: 95  89 92   Resp: 22 17  18    Temp: 97.2 F (36.2 C) 97.5 F (36.4 C)     TempSrc:  Axillary     SpO2: 100%   93%   Weight: 98 kg (216 lb 0.8 oz)      Height:           Temp (24hrs), Avg:97.5 F (36.4 C), Min:97 F (36.1 C), Max:98.7 F (37.1 C)    Weight Monitoring 08/27/2017 08/28/2017 08/29/2017 08/30/2017 09/16/2017 09/16/2017 09/17/2017   Height 182.9 cm - - - 182.9 cm 182.9 cm -   Height Method - - - - Stated - -   Weight 108.41 kg 108.41 kg 104.3 kg 104.01 kg 104 kg 98.2 kg  98 kg   Weight Method Bed Scale Bed Scale Bed Scale Bed Scale Stated Bed Scale Bed Scale   BMI (calculated) 32.5 kg/m2 - - - 31.2 kg/m2 29.4 kg/m2 -          Telemetry reviewed no changes.       Intake and Output Summary (Last 24 hours) at Date Time    Intake/Output Summary (Last 24 hours) at 09/17/17 1013  Last data filed at 09/17/17 0958   Gross per 24 hour   Intake              770 ml   Output             2950 ml   Net            -2180 ml       General Appearance:  Breathing comfortable, no acute distress  Neck:  No carotid bruit or jugular venous distension, brisk carotid upstroke  Lungs:  Crackles at the bases bilaterally R>L, decreased bilaterally, short shallow breaths.   Heart:  S1, S2 normal, no S3, no S4, no murmur, PMI not displaced, no rub   Abdomen:  Soft, non-tender, positive bowel sounds, no hepatojugular reflux  Extremities:  No cyanosis, clubbing or edema  Pulses:  Equal radial pulses, 4/4 symmetric   Neurologic:  Alert and oriented x3, mood and affect normal    Labs:     Recent Labs  Lab 09/17/17  0810 09/16/17  2354 09/16/17  1429   Troponin I 0.10* 0.11* 0.11*     Recent Labs  Lab 09/16/17  1429   Bilirubin, Total  1.5*   Protein, Total 6.4   Albumin 3.6   ALT 116*   AST (SGOT) 73*       Recent Labs  Lab 09/17/17  0810   Magnesium 2.2       Recent Labs  Lab 09/17/17  0810   PT 17.0*   PT INR 1.4*   PTT 31       Recent Labs  Lab 09/17/17  0810 09/16/17  1429   WBC 8.14 11.21*   Hgb 13.2 13.9   Hematocrit 39.5* 42.2   Platelets 143 161       Recent Labs  Lab 09/17/17  0810 09/16/17  2354 09/16/17  1429   Sodium 141  --  139   Potassium 4.0 3.8 5.2*   Chloride 107  --  108   CO2 24  --  19*   BUN 20.2  --  23.9   Creatinine 1.3  --  1.7*   EGFR >60.0  --  48.4   Glucose 127*  --  200*   Calcium 8.9  --  9.2       Estimated Creatinine Clearance: 64.2 mL/min (based on SCr of 1.3 mg/dL).      Lab Results   Component Value Date    BNP 1,792.4 (H) 09/16/2017      Imaging:       Xr Chest  Ap Portable    Result Date: 09/16/2017   Cardiomegaly with findings suggestive of pulmonary edema. Shelly Flatten, MD 09/16/2017 3:10 PM        Signed by: Amado Nash, PA    Patient seen and examined.  Agree with NP/PA note, exam, and plan as above with changes in italics.    Arvetta Araque A. Marnette Burgess, MD, [facility]     Heart  NP Spectralink [phone] (8am-5pm)  MD Spectralink [phone] (8am-5pm)  After hours, non urgent consult line [phone]  After Hours, urgent consults [phone]

## 2017-09-17 NOTE — Progress Notes (Signed)
[name] [name] HOSPITALIST  Progress Note  Patient Info:   Date/Time: 09/17/2017 / 5:02 PM   Admit Date:09/16/2017  Patient Name:[name] [name]   UEA:[id number]   PCP: Zorita Pang, MD  Attending Physician:Miller Limehouse, Michiel Sites, MD     Assessment and Plan:   71 y/o male with past h/o severe isc cardiomyopathy CAD,CHF,DM,HTN, came in with a chief C/o Shortness of breath and fluid overload.  1.  Acute on chronic systolic congestive heart failure:   Severe ischemic cardiomyopathy with EF of 30%. Per Echo on 11/2016.   Volume overloaded. Seen at [facility] office and sent to ER.   Shortness of breath with minimal exertion.  Management per cardio with IV diuresis, daily weights, strict I's and O's follow BNP daily.   2. CAD:   Cardiac cath 2014 with nonobstructive coronary artery disease.  September 2017, with normal nuclear stress test.  Elevated trop with demand ischemia.  Lipitor, Coreg, niacin. Management per cardiology.   3. 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   4.  Chronic kidney disease: On CRRT recently.  Appreciate Dr. Roswell Nickel input. Agree with decreasing the dose of lasix. Renal function much better.   5.  Diabetes mellitus type 2:   At home, managed on Lantus 50 units daily at bedtime.  He had blood sugars have been slightly on the higher end of normal.  Started initially on 20 units we will increase to 30 units today.  6.  Hypertension:  Blood pressure seems to be fairly controlled.  Continue Coreg, Lasix.  7.  History of sleep disorder breathing:   Pulmonary consult called in.    Appreciate pulmonary, Dr. Twana First input.  8.  History of asthma: Patient seems to be stable.  No wheezing.  Does seem to be shortness of breath with minimal exertion.      DVT Prohylaxis:heparin and SEDs   Central Line/Foley Catheter/PICC line status: none  Code Status: Full Code  Disposition:to be determined  Type of Admission:Inpatient  Expected Date of Discharge: 2 midnights atleast  Milestones  required for discharge:achieve euvolemic stage, not SOB with min exertion, creatinine basline with po meds.   Hospital Problems:   Active Problems:    SOB (shortness of breath)    Subjective:   09/17/17 continues to complain of shortness of breath with minimal exertion.  Continues to complain of abdominal discomfort, distention.  Bilateral pedal edema.  Chief Complaint:  Shortness of Breath and Congestive Heart Failure    Review of Systems   Constitutional: Negative for chills, fever and malaise/fatigue.   Respiratory: Positive for cough and shortness of breath. Negative for sputum production and wheezing.    Cardiovascular: Positive for leg swelling. Negative for chest pain and palpitations.   Gastrointestinal: Negative for abdominal pain, constipation, nausea and vomiting.   Genitourinary: Negative for dysuria.   Musculoskeletal: Negative for myalgias.   Neurological: Negative for dizziness, sensory change, focal weakness and weakness.     Objective:     Vitals:    09/17/17 0802 09/17/17 0805 09/17/17 0913 09/17/17 1325   BP: 126/90   118/81   Pulse:  89 92 96   Resp: 17  18 18    Temp: 97.5 F (36.4 C)   98.8 F (37.1 C)   TempSrc: Axillary      SpO2:   93% 98%   Weight:       Height:         Physical Exam:   Physical Exam   Constitutional: He  appears not malnourished and not dehydrated. He appears unhealthy.  Non-toxic appearance. He does not have a sickly appearance.   Elderly male in mild resp distress with min exertion.    HENT:   Head: Normocephalic and atraumatic.   Neck: Normal range of motion. Neck supple. JVD present.   Cardiovascular: Normal rate and regular rhythm.    Murmur heard.  Pulmonary/Chest: He is in respiratory distress. He has no wheezes. He has rales. He exhibits no tenderness.   Abdominal: Soft. Bowel sounds are normal. He exhibits distension. There is no tenderness. There is no rebound and no guarding.   Musculoskeletal: Normal range of motion. He exhibits edema. He exhibits no deformity.      Results of Labs/imaging   Labs and radiology reports have been reviewed.    Hospitalist   Signed by:   Brynda Rim  09/17/2017 5:02 PM    *This note was generated by the Epic EMR system/ Dragon speech recognition and may contain inherent errors or omissions not intended by the user. Grammatical errors, random word insertions, deletions, pronoun errors and incomplete sentences are occasional consequences of this technology due to software limitations. Not all errors are caught or corrected. If there are questions or concerns about the content of this note or information contained within the body of this dictation they should be addressed directly with the author for clarification

## 2017-09-17 NOTE — Plan of Care (Signed)
Ask3Teach3 Program    Education about New Medications and their Side effects    Dear [name] [name],    Its been a pleasure taking care of you during your hospitalization here at [facility]. We have initiated a new program to educate our patients and/or their family members or designated personnel about the new medications started by your physicians and their indications along with the possible side effects. Multiple studies have shown that patients started on new medications are often unaware of the names of the medication along with the indications and their side effects which leads to decreased compliance with the medications.    During our conversation today on 09/17/2017  9:33 AM I have explained to you the name of the new medication and the indication along with some possible common side effects. Listed below are some of the new medications started during this hospitalization.     Please call the Nurse if you have any side effects while in hospital.     Please call 911 if you have any life threatening symptoms after you are discharged from the hospital.    Please inform your Primary care physician for common side effects which are not life threatening after discharge.    Corticosteroids:Advair (Combination Flovent and Salmeterol)    Side Effects:    - Cough/Brochospasm  - Hoarseness/Sore Throat  -Thrush/Candida    Thank you for your time.    Kyung Bacca, RT  09/17/2017  9:33 AM  [facility]  [address]  [place], [place]  [address]

## 2017-09-17 NOTE — Progress Notes (Signed)
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    No SOB,feels better  No Chest pain  No Abdominal pain  No Nausea/Vomiting  No Diarrhea      Objective:   Vital signs in last 24 hours:  Temp:  [97 F (36.1 C)-98.7 F (37.1 C)] 97.5 F (36.4 C)  Heart Rate:  [86-106] 89  Resp Rate:  [16-30] 17  BP: (126-155)/(87-115) 126/90  FiO2:  [30 %-40 %] 30 %     General:    In NAD    HEENT:   Conjunctiva pale, Dry mucosa          CVS:  RRR, no rub        Chest:  CTA/Bilaterally  Abdomen:   Soft, nontender           Ext.:   + edema      Intake/Output:   Intake/Output last 24 hours:    Intake/Output Summary (Last 24 hours) at 09/17/17 0905  Last data filed at 09/17/17 0856   Gross per 24 hour   Intake              770 ml   Output             2650 ml   Net            -1880 ml     Intake/Output this shift:  I/O this shift:  In: 360 [P.O.:360]  Out: 450 [Urine:450]      Labs:       Recent Labs  Lab 09/17/17  0810 09/16/17  2354 09/16/17  1430 09/16/17  1429   Sodium 141  --   --  139   Potassium 4.0 3.8  --  5.2*   Chloride 107  --   --  108   CO2 24  --   --  19*   BUN 20.2  --   --  23.9   Creatinine 1.3  --   --   1.7*   Glucose 127*  --   --  200*   Calcium 8.9  --   --  9.2   Magnesium 2.2 1.5* 2.1  --    Phosphorus  --   --  4.3  --    EGFR >60.0  --   --  48.4       Recent Labs  Lab 09/17/17  0810 09/16/17  1429   WBC 8.14 11.21*   Hgb 13.2 13.9   Hematocrit 39.5* 42.2   Platelets 143 161                   Medications:   Scheduled Meds:       amitriptyline 25 mg Oral QHS   atorvastatin 40 mg Oral QHS   carvedilol 6.25 mg Oral Q12H SCH   fluticasone-salmeterol 1 puff Inhalation BID   furosemide 60 mg Intravenous TID   heparin (porcine) 5,000 Units Subcutaneous Q12H SCH   insulin glargine 20 Units Subcutaneous QHS   niacin 500 mg Oral QHS   pantoprazole 40 mg Oral QAM AC   tamsulosin 0.4 mg Oral QD after dinner         Continuous Infusions:        PRN Meds:acetaminophen, albuterol, bisacodyl, Nursing communication: Adult Hypoglycemia Treatment Algorithm **AND** dextrose **AND** dextrose **AND** glucagon (rDNA), insulin lispro

## 2017-09-17 NOTE — Progress Notes (Signed)
09/17/17 2250   Patient Type   Within 30 Days of Previous Admission? Yes  (IP 11/4 to 11/12 )   Healthcare Decisions   Interviewed: Patient   Orientation/Decision Making Abilities of Patient Alert and Oriented x3, able to make decisions   Advance Directive Patient does not have advance directive  (Pnt does not want information at this time. )   Healthcare Agent Appointed Yes   Healthcare Agent's Name Pnt verbalized to CM Houa Ackert, wife    Healthcare Agent's Phone Number [phone]   Additional Emergency Contacts? Rozell Searing, [place] in [place] [phone]   Prior to admission   Prior level of function Ambulates with assistive device;Needs assistance with ADLs  (Pnt reports in last 2 weeks at rehab he has been walking w walker but tires easily and will rest in w/c.  They have also been assisting him w ADL care prior to rehab pnt was independent in all spheres )   Type of Residence Nursing home  (Pnt is at SNF [facility] [place], CM sent referral via [facility] to update them on pnt condition. )   Home Layout One level   Have running water, electricity, heat, etc? Yes   Living Arrangements Other (Comment)  (Pnt currently at SNF but prior to that lived at home w his wife. )   How do you get to your MD appointments? NH    How do you get your groceries? NH    Who fixes your meals? NH    Who does your laundry? NH    Who picks up your prescriptions? NH currently    Dressing Needs assistance   Grooming Independent   Feeding Independent   Bathing Needs assistance   Toileting Needs assistance   DME Currently at Home Other (Comment)  (Pnt using DME at facility walker and w/c.  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 )   Health visitor None   Prior SNF admission? (Detail) currently at [facility], most likely on pnt return they will need a new authorization from insurance that may only be able to be obtained during normal business hours such as M-F, pnt is aware.  CM noted to MD need for  PT/OT to get new auth.     Prior Rehab admission? (Detail) NA    Adult Protective Services (APS) involved? No   Discharge Planning   Support Systems Spouse/significant other;Family members;Church/faith community  (Pnt reports strong family support system w his wife, youngest son, Harrell Lark, Celine Ahr in [place] and his Rev don Laural Benes and church family at [employer].  Pnt has no/to little contact w 3 older chn. )   Patient expects to be discharged to: [facility]    Anticipated [place] plan discussed with: Same as interviewed;Patient   Potential barriers to discharge: Decreased mobility   Mode of transportation: Other  (w/c van vs PTS vs family dependent on medical necessity )   Consults/Providers   PT Evaluation Needed 1  (yes)   OT Evalulation Needed 1  (yes)   SLP Evaluation Needed 2  (no)   Outcome Palliative Care Screen Screened but did not meet criteria for intervention   Correct PCP listed in Epic? Yes   Important Message from [facility] Notice   Patient received 1st IMM Letter? n/a

## 2017-09-17 NOTE — Consults (Signed)
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  Did not have Cheyne-Stokes breathing.    Plan:   I spoke with the respiratory therapy .The patient is on BiPAP 15/5 with backup rate of 12 and 30% FiO2.  Patient definitely needs to have backup rate on the BiPAP.  Patient did not have obstructive event and this setting last night.  We will continue the current setting wall being admitted in the hospital.  I suggest to schedule patient for BiPAP titration as an outpatient instead of CPAP titration .  With ejection fraction of 30% ,  patient is not a candidate off ASV titration.    -Severe ischemic cardiomyopathy:    -History of asthma and minimal bronchiectasis:     History of Present Illness:   [name] [name] is a 71 y.o. male who presents to the hospital with Increasing shortness of breath on 09/16/17.  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  Patient has episodes of apnea off of BiPAP when he falls asleep.  She stated that shortness of breath has improved.  He is not having any pain.  No complaints of dyspnea on exertion and is feeling dizzy when stands up.  Does not have any cough.        Past Medical History:     Past Medical History:   Diagnosis Date   . AICD (automatic cardioverter/defibrillator) present 2014   . CAD (coronary artery disease)    . Cardiac defibrillator in situ    . CHF (congestive heart failure) 08/12/2014   . Diabetes mellitus    .  Heart attack 2011    and 2014   . Hyperlipemia    . Hypertension    . Ischemic cardiomyopathy     20%   . Pneumonia 09/2016       Past Surgical History:     Past Surgical History:   Procedure Laterality Date   . CARDIAC CATHETERIZATION     . CARDIAC DEFIBRILLATOR PLACEMENT     . CARDIAC PACEMAKER PLACEMENT     . CIRCUMCISION     . CORONARY STENT PLACEMENT  2011   . duodenal ulcer  1973   . EGD N/A 08/15/2014    Procedure: EGD;  Surgeon: Colon Branch, MD;  Location: [facility];  Service: Gastroenterology;  Laterality: N/A;  egd w/ bx   . HERNIA REPAIR      [facility]   . orthopedic surgery      R foot corrective   . TONSILLECTOMY AND ADENOIDECTOMY  1954       Family History:     Family History   Problem Relation Age of Onset   . Breast cancer Mother    . Heart attack Mother 30   . Hypertension Mother    . Diabetes Mother    . Diabetes Brother    .  Heart attack Father    . Hypertension Father        Social History:     Social History     Social History   . Marital status: Married     Spouse name: Lajoyce Corners   . Number of children: 0   . Years of education: N/A     Occupational History   . 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 Smoking status: Never Smoker   . Smokeless tobacco: Never Used   . Alcohol use No   . Drug use: No   . Sexual activity: Yes     Partners: Female     Other Topics Concern   . Not on file     Social History Narrative   . No narrative on file       Allergies:   No Known Allergies    Medications:     Facility-Administered Medications Prior to Admission   Medication   . levalbuterol (XOPENEX) nebulizer solution 1.25 mg     Prescriptions Prior to Admission   Medication Sig   . amitriptyline (ELAVIL) 25 MG tablet amitriptyline 25 mg tablet   Take 1 tablet every day by oral route.   Marland Kitchen atorvastatin (LIPITOR) 40 MG tablet Take 1 tablet (40 mg total) by mouth nightly.   . carvedilol (COREG) 6.25 MG tablet Take 1 tablet (6.25 mg total) by mouth every 12 (twelve) hours.   Marland Kitchen  esomeprazole (NEXIUM) 40 MG capsule Take 40 mg by mouth every morning before breakfast.       . fluticasone-salmeterol (ADVAIR DISKUS) 100-50 MCG/DOSE Aerosol Powder, Breath Activtivatede Inhale 1 puff into the lungs 2 (two) times daily.   . furosemide (LASIX) 40 MG tablet Take 1 tablet (40 mg total) by mouth daily.   . insulin glargine (LANTUS) 100 UNIT/ML injection Inject 50 Units into the skin nightly.   . niacin (NIASPAN) 500 MG CR tablet niacin ER 500 mg tablet,extended release 24 hr   . SYMBICORT 80-4.5 MCG/ACT inhaler Inhale 2 puffs into the lungs 2 (two) times daily.       . tamsulosin (FLOMAX) 0.4 MG Cap Take 1 capsule (0.4 mg total) by mouth Daily after dinner.   Marland Kitchen glucose blood (ONE TOUCH ULTRA TEST) test strip 1 each by Other route 2 (two) times daily.Use as instructed     Scheduled Meds:  Current Facility-Administered Medications   Medication Dose Route Frequency   . amitriptyline  25 mg Oral QHS   . atorvastatin  40 mg Oral QHS   . carvedilol  6.25 mg Oral Q12H [facility]   . fluticasone-salmeterol  1 puff Inhalation BID   . furosemide  60 mg Intravenous TID   . heparin (porcine)  5,000 Units Subcutaneous Q12H [facility]   . insulin glargine  20 Units Subcutaneous QHS   . niacin  500 mg Oral QHS   . pantoprazole  40 mg Oral QAM AC   . tamsulosin  0.4 mg Oral QD after dinner       Review of Systems:   As mentioned the HPI.    Physical Exam:     Vitals:    09/17/17 1325   BP: 118/81   Pulse: 96   Resp: 18   Temp: 98.8 F (37.1 C)   SpO2: 98%     Body mass index is 29.3 kg/m.  General: awake, alert, oriented x 3; in no acute distress; able to speak in  complete sentences without accessory muscle use;   HEENT: pupils equal with EOMI; no thyromegaly, no cervical LN, no thrush.  Cardiovascular: regular rate and rhythm, no murmurs, rubs or gallops.   Lungs: clear to auscultation bilaterally, without wheezing,Has few crackles in the bases.has reduced breath sounds bilaterally  Abdomen: soft, non-tender, non-distended; no  palpable masses, no hepatosplenomegaly, normoactive bowel sounds, no rebound or guarding  Extremities: no clubbing, cyanosis,, trace edema.  Neuro: Normal sensory and motor systems. .      Intake and Output Summary (Last 24 hours) at Date Time    Intake/Output Summary (Last 24 hours) at 09/17/17 1536  Last data filed at 09/17/17 1337   Gross per 24 hour   Intake             1010 ml   Output             3250 ml   Net            -2240 ml       Labs:     Results     Procedure Component Value Units Date/Time    Glucose Whole Blood - POCT [[id number]]  (Abnormal) Collected:  09/17/17 1122     Updated:  09/17/17 1125     POCT - Glucose Whole blood 226 (H) mg/dL     CBC Pathologist Review [[id number]] Collected:  09/16/17 1429     Updated:  09/17/17 1114     CBC Pathologist Review See Note    Magnesium [[id number]] Collected:  09/17/17 0810    Specimen:  Blood Updated:  09/17/17 0858     Magnesium 2.2 mg/dL     Narrative:       Victorino Dike and Dahlia Client attempted then patient refused.  09/17/2017  07:04    GFR [[id number]] Collected:  09/17/17 0810     Updated:  09/17/17 0858     EGFR >60.0    Narrative:       Victorino Dike and Dahlia Client attempted then patient refused.  09/17/2017  07:04    Basic Metabolic Panel [[id number]]  (Abnormal) Collected:  09/17/17 0810    Specimen:  Blood Updated:  09/17/17 0858     Glucose 127 (H) mg/dL      BUN 25.3 mg/dL      Creatinine 1.3 mg/dL      Calcium 8.9 mg/dL      Sodium 664 mEq/L      Potassium 4.0 mEq/L      Chloride 107 mEq/L      CO2 24 mEq/L      Anion Gap 10.0    Narrative:       Victorino Dike and Dahlia Client attempted then patient refused.  09/17/2017  07:04    Troponin I [[id number]]  (Abnormal) Collected:  09/17/17 0810    Specimen:  Blood Updated:  09/17/17 0857     Troponin I 0.10 (H) ng/mL     Narrative:       HARD STICK, JEN AND HANNAH COULDN'T GET. Notified Manjeet (RN)   09/17/2017  06:45    PT/APTT [[id number]]  (Abnormal) Collected:  09/17/17 0810     Updated:  09/17/17 0839     PT 17.0 (H) sec       PT INR 1.4 (H)     PT Anticoag. Given Within 48 hrs. None     PTT 31 sec     Narrative:       Victorino Dike and Dahlia Client attempted then patient refused.  09/17/2017  07:04  CBC [[id number]]  (Abnormal) Collected:  09/17/17 0810    Specimen:  Blood from Blood Updated:  09/17/17 0828     WBC 8.14 x10 3/uL      Hgb 13.2 g/dL      Hematocrit 30.8 (L) %      Platelets 143 x10 3/uL      RBC 4.34 (L) x10 6/uL      MCV 91.0 fL      MCH 30.4 pg      MCHC 33.4 g/dL      RDW 16 (H) %      MPV 12.4 (H) fL      Nucleated RBC 0.0 /100 WBC      Absolute NRBC 0.00 x10 3/uL     Narrative:       Victorino Dike and Dahlia Client attempted then patient refused.  09/17/2017  07:04    APTT [[id number]] Collected:  09/17/17 0810     Updated:  09/17/17 0824    Narrative:       Victorino Dike and Dahlia Client attempted then patient refused.  09/17/2017  07:04  Victorino Dike and Dahlia Client attempted then patient refused.  09/17/2017  07:04    Glucose Whole Blood - POCT [[id number]]  (Abnormal) Collected:  09/17/17 0723     Updated:  09/17/17 0732     POCT - Glucose Whole blood 151 (H) mg/dL     Magnesium [[id number]]  (Abnormal) Collected:  09/16/17 2354    Specimen:  Blood Updated:  09/17/17 0043     Magnesium 1.5 (L) mg/dL     Potassium [[id number]] Collected:  09/16/17 2354    Specimen:  Blood Updated:  09/17/17 0043     Potassium 3.8 mEq/L     Troponin I [[id number]]  (Abnormal) Collected:  09/16/17 2354    Specimen:  Blood Updated:  09/17/17 0022     Troponin I 0.11 (H) ng/mL     Sodium, urine, random [[id number]] Collected:  09/16/17 1813    Specimen:  Urine Updated:  09/16/17 2136     Sodium, UR 125 mEq/L     Glucose Whole Blood - POCT [[id number]]  (Abnormal) Collected:  09/16/17 2049     Updated:  09/16/17 2129     POCT - Glucose Whole blood 125 (H) mg/dL     Magnesium [[id number]] Collected:  09/16/17 1430    Specimen:  Blood Updated:  09/16/17 2000     Magnesium 2.1 mg/dL     Phosphorus [[id number]] Collected:  09/16/17 1430    Specimen:  Blood Updated:  09/16/17 2000      Phosphorus 4.3 mg/dL     Urinalysis with microscopic [[id number]] Collected:  09/16/17 1813    Specimen:  Urine Updated:  09/16/17 1830     Urine Type Clean Catch     Color, UA Colorless     Clarity, UA Clear     Specific Gravity UA 1.006     Urine pH 7.0     Leukocyte Esterase, UA Negative     Nitrite, UA Negative     Protein, UR Negative     Glucose, UA Negative     Ketones UA Negative     Urobilinogen, UA Normal mg/dL      Bilirubin, UA Negative     Blood, UA Negative     RBC, UA 0-2 /hpf     Arterial Blood Gas (ABG) [[id number]]  (Abnormal) Collected:  09/16/17 1528     Updated:  09/16/17 1542     ABG CollectionSite Right Radl  Allen's Test No PT NOT ABLE     Temperature 98.6     ARTERIAL PH CORRECTED 7.40     ARTERIAL PCO2 CORRECTED 38.0 mmHg      Arterial pO2 Corrected 171.0 (H) mmHg      pH, Arterial 7.40     pCO2, Arterial 38.0 mmHg      pO2, Arterial 171.0 (H) mmHg      HCO3, Arterial 23.5 mEq/L      Base Excess, Arterial -1.1 mEq/L      O2 Sat, Arterial 99.5 %      FIO2 40.0 %      O2 Delivery BiPAP     Rate 12 BPM      BIPAP(I) 10.0 cmH2O      BIPAP(E) 5.0 cmH2O           Rads:   Radiological Procedure reviewed.   Radiology Results (24 Hour)     ** No results found for the last 24 hours. **          Signed by: Arthur Holms, MD 09/17/2017 3:36 PM

## 2017-09-18 DIAGNOSIS — I255 Ischemic cardiomyopathy: Secondary | ICD-10-CM

## 2017-09-18 DIAGNOSIS — R945 Abnormal results of liver function studies: Secondary | ICD-10-CM

## 2017-09-18 DIAGNOSIS — I5023 Acute on chronic systolic (congestive) heart failure: Secondary | ICD-10-CM

## 2017-09-18 DIAGNOSIS — E119 Type 2 diabetes mellitus without complications: Secondary | ICD-10-CM

## 2017-09-18 DIAGNOSIS — Z794 Long term (current) use of insulin: Secondary | ICD-10-CM

## 2017-09-18 DIAGNOSIS — R0600 Dyspnea, unspecified: Secondary | ICD-10-CM

## 2017-09-18 LAB — CBC
Absolute NRBC: 0 10*3/uL
Hematocrit: 39.6 % — ABNORMAL LOW (ref 42.0–52.0)
Hgb: 12.8 g/dL — ABNORMAL LOW (ref 13.0–17.0)
MCH: 30 pg (ref 28.0–32.0)
MCHC: 32.3 g/dL (ref 32.0–36.0)
MCV: 93 fL (ref 80.0–100.0)
MPV: 11.9 fL (ref 9.4–12.3)
Nucleated RBC: 0 /100 WBC (ref 0.0–1.0)
Platelets: 141 10*3/uL (ref 140–400)
RBC: 4.26 10*6/uL — ABNORMAL LOW (ref 4.70–6.00)
RDW: 16 % — ABNORMAL HIGH (ref 12–15)
WBC: 8.45 10*3/uL (ref 3.50–10.80)

## 2017-09-18 LAB — HEPATIC FUNCTION PANEL
ALT: 82 U/L — ABNORMAL HIGH (ref 0–55)
AST (SGOT): 35 U/L — ABNORMAL HIGH (ref 5–34)
Albumin/Globulin Ratio: 1.4 (ref 0.9–2.2)
Albumin: 3.1 g/dL — ABNORMAL LOW (ref 3.5–5.0)
Alkaline Phosphatase: 148 U/L — ABNORMAL HIGH (ref 38–106)
Bilirubin Direct: 0.7 mg/dL — ABNORMAL HIGH (ref 0.0–0.5)
Bilirubin Indirect: 0.9 mg/dL (ref 0.0–1.1)
Bilirubin, Total: 1.6 mg/dL — ABNORMAL HIGH (ref 0.2–1.2)
Globulin: 2.2 g/dL (ref 2.0–3.6)
Protein, Total: 5.3 g/dL — ABNORMAL LOW (ref 6.0–8.3)

## 2017-09-18 LAB — BASIC METABOLIC PANEL
Anion Gap: 9 (ref 5.0–15.0)
BUN: 22.2 mg/dL (ref 9.0–28.0)
CO2: 25 mEq/L (ref 22–29)
Calcium: 8.4 mg/dL (ref 7.9–10.2)
Chloride: 105 mEq/L (ref 100–111)
Creatinine: 1.4 mg/dL — ABNORMAL HIGH (ref 0.7–1.3)
Glucose: 158 mg/dL — ABNORMAL HIGH (ref 70–100)
Potassium: 3.7 mEq/L (ref 3.5–5.1)
Sodium: 139 mEq/L (ref 136–145)

## 2017-09-18 LAB — B-TYPE NATRIURETIC PEPTIDE: B-Natriuretic Peptide: 956.2 pg/mL — ABNORMAL HIGH (ref 0.0–100.0)

## 2017-09-18 LAB — GFR: EGFR: >60

## 2017-09-18 LAB — GLUCOSE WHOLE BLOOD - POCT
Whole Blood Glucose POCT: 153 mg/dL — ABNORMAL HIGH (ref 70–100)
Whole Blood Glucose POCT: 293 mg/dL — ABNORMAL HIGH (ref 70–100)
Whole Blood Glucose POCT: 268 mg/dL — ABNORMAL HIGH (ref 70–100)
Whole Blood Glucose POCT: 375 mg/dL — ABNORMAL HIGH (ref 70–100)

## 2017-09-18 MED ORDER — FUROSEMIDE 10 MG/ML IJ SOLN
60.00 mg | Freq: Two times a day (BID) | INTRAMUSCULAR | Status: DC
Start: 2017-09-18 — End: 2017-09-19
  Administered 2017-09-18 (×2): 60 mg via INTRAVENOUS
  Filled 2017-09-18 (×3): qty 6

## 2017-09-18 MED ORDER — INSULIN GLARGINE 100 UNIT/ML SC SOLN
25.00 [IU] | Freq: Every evening | SUBCUTANEOUS | Status: DC
Start: 2017-09-18 — End: 2017-09-19
  Administered 2017-09-18: 25 [IU] via SUBCUTANEOUS
  Filled 2017-09-18: qty 25

## 2017-09-18 MED ORDER — INSULIN LISPRO 100 UNIT/ML SC SOLN
5.00 [IU] | Freq: Three times a day (TID) | SUBCUTANEOUS | Status: DC
Start: 2017-09-18 — End: 2017-09-21
  Administered 2017-09-18 – 2017-09-21 (×8): 5 [IU] via SUBCUTANEOUS
  Filled 2017-09-18 (×8): qty 15

## 2017-09-18 NOTE — Progress Notes (Signed)
Pulmonary Progress Note  "[facility]"    Date Time: 09/18/17 6:29 PM  Patient Name: [name] [name]  Attending Physician: Camillo Flaming, MD      Assessment:     Patient Active Problem List   Diagnosis   . OSAS: predominantly central. The patient tolerated BiPAP last night with backup rate. The patient was urged to call our office following discharge to reschedule the BiPAP titration study.   . Asthma: doing well on Advair + prn albuterol   . CAD (coronary artery disease)   . Non-ischemic cardiomyopathy: patient unclear as to dietary restrictions. Will request dietary consult for Monday           Plan:   See above      Interval History   Interval History/24 hr. Events: feeling better    Physical Exam:   Vital signs for last 24 hours:  Temp:  [97.3 F (36.3 C)-98.4 F (36.9 C)] 97.7 F (36.5 C)  Heart Rate:  [80-98] 97  Resp Rate:  [18-19] 18  BP: (104-129)/(70-91) 129/91  FiO2:  [30 %] 30 %    Intake and Output Summary (Last 24 hours) at Date Time    Intake/Output Summary (Last 24 hours) at 09/18/17 1829  Last data filed at 09/18/17 1729   Gross per 24 hour   Intake             1090 ml   Output             3220 ml   Net            -2130 ml       General: awake, alert, oriented x 3; no acute distress; able to speak in complete sentences without accessory muscle use; in   HEENT: pupils equal with EOMI; no thyromegaly, no cervical LN, no thrush.  Cardiovascular: regular rate and rhythm, no murmurs, rubs or gallops. No P2/S2 present. No R sided heave. No JVD, no HJR.  Lungs: clear to auscultation bilaterally, without wheezing, rhonchi, or rales.  Abdomen: soft, non-tender, non-distended; no palpable masses, no hepatosplenomegaly, normoactive bowel sounds, no rebound or guarding  Extremities: no clubbing, cyanosis, or edema.        Meds:   Scheduled Meds:  Current Facility-Administered Medications   Medication Dose Route Frequency   . amitriptyline  25 mg Oral QHS   . atorvastatin   40 mg Oral QHS   . carvedilol  6.25 mg Oral Q12H [facility]   . fluticasone-salmeterol  1 puff Inhalation BID   . furosemide  60 mg Intravenous BID   . heparin (porcine)  5,000 Units Subcutaneous Q12H [facility]   . insulin glargine  25 Units Subcutaneous QHS   . insulin lispro  5 Units Subcutaneous TID AC   . niacin  500 mg Oral QHS   . pantoprazole  40 mg Oral QAM AC   . tamsulosin  0.4 mg Oral QD after dinner     Continuous Infusions:  PRN Meds:.acetaminophen, albuterol, bisacodyl, Nursing communication: Adult Hypoglycemia Treatment Algorithm **AND** dextrose **AND** dextrose **AND** glucagon (rDNA), insulin lispro      Labs:     Results     Procedure Component Value Units Date/Time    Glucose Whole Blood - POCT [[id number]]  (Abnormal) Collected:  09/18/17 1610     Updated:  09/18/17 1622     POCT - Glucose Whole blood 268 (H) mg/dL     Glucose Whole Blood - POCT [[id number]]  (Abnormal)  Collected:  09/18/17 1113     Updated:  09/18/17 1130     POCT - Glucose Whole blood 293 (H) mg/dL     Glucose Whole Blood - POCT [[id number]]  (Abnormal) Collected:  09/18/17 0717     Updated:  09/18/17 0749     POCT - Glucose Whole blood 153 (H) mg/dL     B-type Natriuretic Peptide [[id number]]  (Abnormal) Collected:  09/18/17 0614    Specimen:  Blood Updated:  09/18/17 0644     B-Natriuretic Peptide 956.2 (H) pg/mL     Basic Metabolic Panel [[id number]]  (Abnormal) Collected:  09/18/17 0614    Specimen:  Blood Updated:  09/18/17 0642     Glucose 158 (H) mg/dL      BUN 08.6 mg/dL      Creatinine 1.4 (H) mg/dL      Calcium 8.4 mg/dL      Sodium 578 mEq/L      Potassium 3.7 mEq/L      Chloride 105 mEq/L      CO2 25 mEq/L      Anion Gap 9.0    Hepatic function panel (LFT) [[id number]]  (Abnormal) Collected:  09/18/17 0614    Specimen:  Blood Updated:  09/18/17 0642     Bilirubin, Total 1.6 (H) mg/dL      Bilirubin, Direct 0.7 (H) mg/dL      Bilirubin, Indirect 0.9 mg/dL      AST (SGOT) 35 (H) U/L      ALT 82 (H) U/L      Alkaline Phosphatase 148 (H)  U/L      Protein, Total 5.3 (L) g/dL      Albumin 3.1 (L) g/dL      Globulin 2.2 g/dL      Albumin/Globulin Ratio 1.4    GFR [[id number]] Collected:  09/18/17 0614     Updated:  09/18/17 0642     EGFR >60.0    CBC [[id number]]  (Abnormal) Collected:  09/18/17 0614    Specimen:  Blood from Blood Updated:  09/18/17 0624     WBC 8.45 x10 3/uL      Hgb 12.8 (L) g/dL      Hematocrit 03.4 (L) %      Platelets 141 x10 3/uL      RBC 4.26 (L) x10 6/uL      MCV 93.0 fL      MCH 30.0 pg      MCHC 32.3 g/dL      RDW 16 (H) %      MPV 11.9 fL      Nucleated RBC 0.0 /100 WBC      Absolute NRBC 0.00 x10 3/uL     Glucose Whole Blood - POCT [[id number]]  (Abnormal) Collected:  09/17/17 2026     Updated:  09/17/17 2029     POCT - Glucose Whole blood 294 (H) mg/dL             Radiology:     Radiology Results (24 Hour)     ** No results found for the last 24 hours. **            Signed by: Elvera Lennox, MD  Date/Time: 09/18/2017 6:29 PM

## 2017-09-18 NOTE — Plan of Care (Signed)
Problem: Heart Failure  Goal: Stable vital signs and fluid balance  Outcome: Progressing   09/18/17 0932   Goal/Interventions addressed this shift   Stable vital signs and fluid balance Monitor/assess vital signs and telemetry per unit protocol;Weigh on admission and record weight daily;Monitor intake/output per unit protocol and/or LIP order;Monitor lab values

## 2017-09-18 NOTE — Progress Notes (Signed)
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  -DM II with CKD  -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    Plan:   -Strict 2 gm sodium diet  -Needs IV diuresis still as has no significant improvement  -Fluid restriction to 1.2 liters/day  -Strict I/O  -Will likely need re-eval with echo given acute recurrent decompensation  -If stable cardiac wise would get renal dopplers to r/o RAS as a cause of sudden pulm edema  -Daily renal fx  -D/W Wife    Eddie North, M.D  [facility] [place]  [phone]  Subjective:   Still very short of breath  Feels some improvement    Objective:   Vital signs in last 24 hours:  Temp:  [97.3 F (36.3 C)-98.4 F (36.9 C)] 97.7 F (36.5 C)  Heart Rate:  [80-98] 97  Resp Rate:  [18-19] 18  BP: (104-129)/(70-91) 129/91  FiO2:  [30 %] 30 %     General:    In NAD    HEENT:   Conjunctiva pale, Dry mucosa          CVS:  RRR, no rub        Chest:  Rales+  Abdomen:   Soft, nontender           Ext.:  1+edema posteriorly      Intake/Output:   Intake/Output last 24 hours:    Intake/Output Summary (Last 24 hours) at 09/18/17 1848  Last data filed at 09/18/17 1729   Gross per 24 hour   Intake             1090 ml   Output             3220 ml   Net            -2130 ml     Intake/Output this shift:  I/O this shift:  In: 990 [P.O.:990]  Out: 1595 [Urine:1595]      Labs:       Recent Labs  Lab 09/18/17  0614 09/17/17  0810 09/16/17  2354 09/16/17  1430 09/16/17  1429   Sodium  139 141  --   --  139   Potassium 3.7 4.0 3.8  --  5.2*   Chloride 105 107  --   --  108   CO2 25 24  --   --  19*   BUN 22.2 20.2  --   --  23.9   Creatinine 1.4* 1.3  --   --  1.7*   Glucose 158* 127*  --   --  200*   Calcium 8.4 8.9  --   --  9.2   Magnesium  --  2.2 1.5* 2.1  --    Phosphorus  --   --   --  4.3  --    EGFR >60.0 >60.0  --   --  48.4       Recent Labs  Lab 09/18/17  0614 09/17/17  0810 09/16/17  1429   WBC 8.45 8.14 11.21*   Hgb 12.8* 13.2 13.9   Hematocrit 39.6* 39.5* 42.2   Platelets 141 143 161                   Medications:   Scheduled Meds:       amitriptyline 25 mg Oral QHS   atorvastatin 40 mg Oral QHS   carvedilol 6.25 mg Oral Q12H SCH   fluticasone-salmeterol 1 puff Inhalation BID   furosemide 60 mg Intravenous BID   heparin (porcine) 5,000 Units Subcutaneous Q12H [facility]   insulin glargine 25 Units Subcutaneous QHS   insulin lispro 5 Units Subcutaneous TID AC   niacin 500 mg Oral QHS   pantoprazole 40 mg Oral QAM AC   tamsulosin 0.4 mg Oral QD after dinner         Continuous Infusions:        PRN Meds:acetaminophen, albuterol, bisacodyl, Nursing communication: Adult Hypoglycemia Treatment Algorithm **AND** dextrose **AND** dextrose **AND** glucagon (rDNA), insulin lispro

## 2017-09-18 NOTE — Plan of Care (Signed)
Problem: Safety  Goal: Patient will be free from injury during hospitalization  Outcome: Progressing   09/18/17 0932   Goal/Interventions addressed this shift   Patient will be free from injury during hospitalization  Assess patient's risk for falls and implement fall prevention plan of care per policy;Provide and maintain safe environment;Use appropriate transfer methods;Ensure appropriate safety devices are available at the bedside;Include patient/ family/ care giver in decisions related to safety;Hourly rounding

## 2017-09-18 NOTE — Progress Notes (Signed)
12 beats of NSVT; pt asleep, on Bipap; pt has AICD, currently Vpaced on the monitor; will check AM lytes; Hospitalist paged, waiting for response

## 2017-09-18 NOTE — Progress Notes (Addendum)
[facility]  PROGRESS NOTE  [facility]    Date Time: 09/18/17 6:20 AM  Patient Name: [name] [name]         Assessment:      Acute on chronic systolic CHF, significantly volume overloaded.   Flat troponin secondary to demand ischemia, not ACS   CKD, most recent admission pt recieved CRRT therapy.  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  ? Normal myocardial perfusion by nuclear stress testing in September of 2017.    Status post [employer] primary prevention ICD which was just upgraded to a Medtronic biventricular ICD device in January of 2018.   History of syncope in the past that has never been proven to represent ventricular arrhythmias by device interrogations. 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    Hypertension, well controlled.    Type 2 diabetes mellitus, on insulin.    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 here at night    Recommendations:    Continue diuresis but lower dose from TID to BID   Labs pending at this time-replete if needed   Continue daily standing weights   Continue coreg for CMP, ACE contraindicated due to CKD requiring recent CRRT      Medications:      Scheduled Meds: PRN Meds:      amitriptyline 25 mg Oral QHS   atorvastatin 40 mg Oral QHS   carvedilol 6.25 mg Oral Q12H SCH   fluticasone-salmeterol 1 puff Inhalation BID   furosemide 60 mg Intravenous TID   heparin (porcine) 5,000 Units Subcutaneous Q12H SCH   insulin glargine 20 Units Subcutaneous QHS   niacin 500 mg Oral QHS   pantoprazole 40 mg Oral QAM AC   tamsulosin 0.4 mg Oral QD after dinner       Continuous Infusions:     acetaminophen 650 mg Q4H PRN   albuterol 2.5 mg BID PRN   bisacodyl 10 mg QD PRN   dextrose 15 g of glucose PRN   And     dextrose 12.5 g PRN   And     glucagon (rDNA) 1 mg PRN   insulin lispro 1-5 Units TID  AC PRN             Subjective:   Denies chest pain or palpitations. Breathing improving, edema improving, reports good UOP      Physical Exam:     Vitals:    09/18/17 0453   BP: 107/77   Pulse: 80   Resp: 18   Temp: 97.3 F (36.3 C)   SpO2: 100%     Temp (24hrs), Avg:98 F (36.7 C), Min:97.3 F (36.3 C), Max:98.8 F (37.1 C)      Telemetry reviewed, V paced, 12 beats NSVT     Intake and Output Summary (Last 24 hours) at Date Time    Intake/Output Summary (Last 24 hours) at 09/18/17 0620  Last data filed at 09/18/17 0453   Gross per 24 hour   Intake              940 ml   Output             3575 ml   Net            -2635 ml       General Appearance:  Breathing  comfortable, no acute distress  Head:  normocephalic  Eyes:  EOM's intact  Neck:  + jugular venous distension  Lungs:  Clear to auscultation throughout, no wheezes, rhonchi or rales, good respiratory effort   Chest Wall:  No tenderness or deformity  Heart:  S1, S2 normal, SEM LSB   Abdomen:  Soft, non-tender, positive bowel sounds, + hepatojugular reflux  Extremities:  1+ edema BLE  Pulses:  Equal radial pulses, 4/4 symmetric  Neurologic:  Alert and oriented x3, mood and affect normal  Musculoskeletal: normal strength and tone    Labs:     Recent Labs  Lab 09/17/17  0810 09/16/17  2354 09/16/17  1429   Troponin I 0.10* 0.11* 0.11*               Recent Labs  Lab 09/16/17  1429   Bilirubin, Total 1.5*   Protein, Total 6.4   Albumin 3.6   ALT 116*   AST (SGOT) 73*       Recent Labs  Lab 09/17/17  0810   Magnesium 2.2       Recent Labs  Lab 09/17/17  0810   PT 17.0*   PT INR 1.4*   PTT 31       Recent Labs  Lab 09/17/17  0810 09/16/17  1429   WBC 8.14 11.21*   Hgb 13.2 13.9   Hematocrit 39.5* 42.2   Platelets 143 161       Recent Labs  Lab 09/17/17  0810 09/16/17  2354 09/16/17  1429   Sodium 141  --  139   Potassium 4.0 3.8 5.2*   Chloride 107  --  108   CO2 24  --  19*   BUN 20.2  --  23.9   Creatinine 1.3  --  1.7*   EGFR >60.0  --  48.4   Glucose 127*  --   200*   Calcium 8.9  --  9.2           Invalid input(s): FREET4    .  Lab Results   Component Value Date    BNP 1,792.4 (H) 09/16/2017      Estimated Creatinine Clearance: 62.7 mL/min (based on SCr of 1.3 mg/dL).    Weight Monitoring 08/28/2017 08/29/2017 08/30/2017 09/16/2017 09/16/2017 09/17/2017 09/18/2017   Height - - - 182.9 cm 182.9 cm - -   Height Method - - - Stated - - -   Weight 108.41 kg 104.3 kg 104.01 kg 104 kg 98.2 kg 98 kg 93.214 kg   Weight Method Bed Scale Bed Scale Bed Scale Stated Bed Scale Bed Scale Standing Scale   BMI (calculated) - - - 31.2 kg/m2 29.4 kg/m2 - -         Imaging:   Radiological Procedure reviewed.              Signed by: Cindee Lame, NP    Patient seen and examined.  Agree with NP/PA note, exam, and plan as above with changes in italics.    Melaya Hoselton A. Marnette Burgess, MD, [facility]      [facility]  NP Spectralink [phone] (8am-5pm)  MD Spectralink [phone] (8am-5pm)  After hours, non urgent consult line [phone]  After Hours, urgent consults [phone]

## 2017-09-18 NOTE — Progress Notes (Addendum)
[name]PITAL[facility]  Progress Note  Patient Info:   Date/Time: 09/18/2017 / 4:18 PM   Admit Date:09/16/2017  Patient Name:[name] [name]   UUV:[id number]   PCP: Zorita Pang, MD  Attending Physician:Kinsleigh Ludolph, Melina Copa, MD     Assessment and Plan:   71 y/o male with past h/o severe ischemic cardiomyopathy CAD,CHF,DM,HTN, came in with a chief C/o Shortness of breath and fluid overload.  Severe ischemic cardiomyopathy with EF of 30%. Per Echo on 11/2016.   Volume overloaded.   Seen at [facility] office and sent to ER.   Shortness of breath with minimal exertion.    1.  Acute on chronic systolic congestive heart failure: Continue diuresis as per the recommendations of cardiology.     Daily weights, strict I's and O's,.  Patient is not a candidate for ace due to his chronic kidney disease and prior history of CRRT.  Continue with Coreg    2. CAD:   Cardiac cath 2014 with nonobstructive coronary artery disease.  September 2017, with normal nuclear stress test.  Elevated trop with demand ischemia.  Lipitor, Coreg, niacin. Management per cardiology    3. 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     4.  Chronic kidney disease: On CRRT recently.  Appreciate Dr. Roswell Nickel input.   Agree with decreasing the dose of lasix. Renal function much better.     5.  Diabetes mellitus type 2:   At home, managed on Lantus 50 units daily at bedtime.  Started at 20 units while he was here.  Increase the dose of Lantus to 25 units.    6.  Hypertension:  Blood pressure seems to be fairly controlled.  Continue Coreg, Lasix.    7.  History of sleep disorder breathing:   Pulmonary consult called in.    Appreciate pulmonary, Dr. Twana First input.    8.  History of asthma: Patient seems to be stable.  No wheezing.  Does seem to be shortness of breath with minimal exertion.    9.  Episode of nonsustained ventricular tachycardia; electrolytes are within normal limits.  Patient does have automatic implantable cardiac  defibrillator in place.  Patient was not symptomatic when he had the episode today at 3:05 PM, lasted for 12 seconds./30 beats      DVT Prohylaxis:heparin and SEDs   Central Line/Foley Catheter/PICC line status: none  Code Status: Full Code  Disposition:to be determined  Type of Admission:Inpatient  Expected Date of Discharge: 2 midnights atleast  Milestones required for discharge:achieve euvolemic stage, not SOB with min exertion, creatinine basline with po meds.   Hospital Problems:   Active Problems:    SOB (shortness of breath)    Subjective:   09/18/17 continues to complain of shortness of breath with minimal exertion. Abdominal discomfort has improved, swelling in the lower extremities has improved slightly.  Chief Complaint:  Shortness of Breath and Congestive Heart Failure    Review of Systems   Constitutional: Negative for chills, fever and malaise/fatigue.   Respiratory: Positive for cough and shortness of breath. Negative for sputum production and wheezing.    Cardiovascular: Positive for leg swelling. Negative for chest pain and palpitations.   Gastrointestinal: Negative for abdominal pain, constipation, nausea and vomiting.   Genitourinary: Negative for dysuria.   Musculoskeletal: Negative for myalgias.   Neurological: Negative for dizziness, sensory change, focal weakness and weakness.     Objective:     Vitals:    09/18/17 0801 09/18/17 0925 09/18/17 0936 09/18/17  1350   BP:  (!) 128/91 (!) 128/91 121/81   Pulse: 85 96 96 91   Resp: 18 18  18    Temp:  97.5 F (36.4 C)  98 F (36.7 C)   TempSrc:  Oral  Oral   SpO2: 96% 97%  99%   Weight:       Height:         Physical Exam:   Physical Exam   Constitutional: He appears not malnourished and not dehydrated. He appears unhealthy.  Non-toxic appearance. He does not have a sickly appearance.   Elderly male in mild resp distress with min exertion.    HENT:   Head: Normocephalic and atraumatic.   Neck: Normal range of motion. Neck supple. JVD present.    Cardiovascular: Normal rate and regular rhythm.    Murmur heard.  Pulmonary/Chest: He is in respiratory distress. He has no wheezes. He has rales. He exhibits no tenderness.   Abdominal: Soft. Bowel sounds are normal. He exhibits distension. There is no tenderness. There is no rebound and no guarding.   Musculoskeletal: Normal range of motion. He exhibits edema. He exhibits no deformity.   Psychiatric: His mood appears anxious.     Results of Labs/imaging   Labs and radiology reports have been reviewed.    Hospitalist   Signed by:   Camillo Flaming  09/18/2017 4:18 PM    *This note was generated by the Epic EMR system/ Dragon speech recognition and may contain inherent errors or omissions not intended by the user. Grammatical errors, random word insertions, deletions, pronoun errors and incomplete sentences are occasional consequences of this technology due to software limitations. Not all errors are caught or corrected. If there are questions or concerns about the content of this note or information contained within the body of this dictation they should be addressed directly with the author for clarification

## 2017-09-18 NOTE — Progress Notes (Signed)
Patient had 30 beats of vtach for 12 seconds. Dr. Krista Blue informed face to face. No new orders.

## 2017-09-18 NOTE — Plan of Care (Signed)
Problem: Discharge Barriers  Goal: Patient will be discharged home or other facility with appropriate resources  Outcome: Progressing   09/18/17 0256   Goal/Interventions addressed this shift   Discharge to home or other facility with appropriate resources Provide information on available health resources;Initiate discharge planning;Provide appropriate patient education       Problem: Heart Failure  Goal: Stable vital signs and fluid balance  Outcome: Progressing   09/18/17 0256   Goal/Interventions addressed this shift   Stable vital signs and fluid balance Monitor/assess vital signs and telemetry per unit protocol;Weigh on admission and record weight daily;Assess signs and symptoms associated with cardiac rhythm changes;Monitor lab values;Monitor for leg swelling/edema and report to L[facility] if abnormal     Goal: Mobility/Activity is maintained at optimal level for patient  Outcome: Progressing   09/18/17 0256   Goal/Interventions addressed this shift   Mobility/activity is maintained at optimal level for patient Maintain proper body alignment;Increase mobility as tolerated/progressive mobility;Encourage independent activity per ability       Problem: Diabetes: Glucose Imbalance  Goal: Blood glucose stable at established goal  Outcome: Progressing   09/18/17 0256   Goal/Interventions addressed this shift   Blood glucose stable at established goal  Monitor lab values;Include patient/family in decisions related to nutrition/dietary selections;Assess for hypoglycemia /hyperglycemia;Monitor/assess vital signs;Coordinate medication administration with meals, as indicated

## 2017-09-19 LAB — CBC AND DIFFERENTIAL
Absolute NRBC: 0 10*3/uL
Basophils Absolute Automated: 0.02 10*3/uL (ref 0.00–0.20)
Basophils Automated: 0.3 %
Eosinophils Absolute Automated: 0.23 10*3/uL (ref 0.00–0.70)
Eosinophils Automated: 2.9 %
Hematocrit: 41.7 % — ABNORMAL LOW (ref 42.0–52.0)
Hgb: 13.6 g/dL (ref 13.0–17.0)
Immature Granulocytes Absolute: 0.02 10*3/uL
Immature Granulocytes: 0.3 %
Lymphocytes Absolute Automated: 4.02 10*3/uL (ref 0.50–4.40)
Lymphocytes Automated: 50.5 %
MCH: 30.6 pg (ref 28.0–32.0)
MCHC: 32.6 g/dL (ref 32.0–36.0)
MCV: 93.9 fL (ref 80.0–100.0)
MPV: 12.4 fL — ABNORMAL HIGH (ref 9.4–12.3)
Monocytes Absolute Automated: 0.56 10*3/uL (ref 0.00–1.20)
Monocytes: 7 %
Neutrophils Absolute: 3.11 10*3/uL (ref 1.80–8.10)
Neutrophils: 39 %
Nucleated RBC: 0 /100 WBC (ref 0.0–1.0)
Platelets: 156 10*3/uL (ref 140–400)
RBC: 4.44 10*6/uL — ABNORMAL LOW (ref 4.70–6.00)
RDW: 16 % — ABNORMAL HIGH (ref 12–15)
WBC: 7.96 10*3/uL (ref 3.50–10.80)

## 2017-09-19 LAB — BASIC METABOLIC PANEL
Anion Gap: 12 (ref 5.0–15.0)
BUN: 20.3 mg/dL (ref 9.0–28.0)
CO2: 26 mEq/L (ref 22–29)
Calcium: 8.6 mg/dL (ref 7.9–10.2)
Chloride: 102 mEq/L (ref 100–111)
Creatinine: 1.3 mg/dL (ref 0.7–1.3)
Glucose: 224 mg/dL — ABNORMAL HIGH (ref 70–100)
Potassium: 4.1 mEq/L (ref 3.5–5.1)
Sodium: 140 mEq/L (ref 136–145)

## 2017-09-19 LAB — GLUCOSE WHOLE BLOOD - POCT
Whole Blood Glucose POCT: 167 mg/dL — ABNORMAL HIGH (ref 70–100)
Whole Blood Glucose POCT: 176 mg/dL — ABNORMAL HIGH (ref 70–100)
Whole Blood Glucose POCT: 226 mg/dL — ABNORMAL HIGH (ref 70–100)
Whole Blood Glucose POCT: 250 mg/dL — ABNORMAL HIGH (ref 70–100)
Whole Blood Glucose POCT: 338 mg/dL — ABNORMAL HIGH (ref 70–100)

## 2017-09-19 LAB — MAGNESIUM: Magnesium: 1.9 mg/dL (ref 1.6–2.6)

## 2017-09-19 LAB — GFR: EGFR: >60

## 2017-09-19 MED ORDER — FUROSEMIDE 10 MG/ML IJ SOLN
80.00 mg | Freq: Two times a day (BID) | INTRAMUSCULAR | Status: DC
Start: 2017-09-19 — End: 2017-09-19

## 2017-09-19 MED ORDER — FUROSEMIDE 10 MG/ML IJ SOLN
80.00 mg | Freq: Two times a day (BID) | INTRAMUSCULAR | Status: DC
Start: 2017-09-19 — End: 2017-09-20
  Administered 2017-09-19 (×2): 80 mg via INTRAVENOUS
  Filled 2017-09-19 (×3): qty 8

## 2017-09-19 MED ORDER — INSULIN GLARGINE 100 UNIT/ML SC SOLN
35.00 [IU] | Freq: Every evening | SUBCUTANEOUS | Status: DC
Start: 2017-09-19 — End: 2017-09-20
  Administered 2017-09-19: 35 [IU] via SUBCUTANEOUS
  Filled 2017-09-19: qty 35

## 2017-09-19 MED ORDER — INSULIN LISPRO 100 UNIT/ML SC SOLN
1.00 [IU] | Freq: Every evening | SUBCUTANEOUS | Status: DC | PRN
Start: 2017-09-19 — End: 2017-09-21
  Administered 2017-09-19: 2 [IU] via SUBCUTANEOUS
  Administered 2017-09-19 – 2017-09-20 (×2): 1 [IU] via SUBCUTANEOUS
  Filled 2017-09-19: qty 6
  Filled 2017-09-19 (×2): qty 3

## 2017-09-19 NOTE — Progress Notes (Signed)
[name]  Progress Note  Patient Info:   Date/Time: 09/19/2017 / 10:44 AM   Admit Date:09/16/2017  Patient Name:[name] [name]   ZOX:[id number]   PCP: Zorita Pang, MD  Attending Physician:Christin Mccreedy, Melina Copa, MD     Assessment and Plan:   71 y/o male with past h/o severe ischemic cardiomyopathy CAD,CHF,DM,HTN, came in with a chief C/o Shortness of breath and fluid overload.  Severe ischemic cardiomyopathy with EF of 30%. Per Echo on 11/2016.   Volume overloaded.   Seen at [facility] office and sent to ER.   Shortness of breath with minimal exertion.    1.  Acute on chronic systolic congestive heart failure:   Continue diuresis as per the recommendations of cardiology.    Daily weights, strict I's and O's,.  Patient is not a candidate for ace due to his chronic kidney disease and prior history of CRRT.  Continue with Coreg    2. CAD:   Cardiac cath 2014 with nonobstructive coronary artery disease.  September 2017, with normal nuclear stress test.  Elevated trop with demand ischemia.  Lipitor, Coreg, niacin.    3. 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     4.  Chronic kidney disease: On CRRT recently.  Appreciate Dr. Roswell Nickel input.   Agree with decreasing the dose of lasix. Renal function much better.     5.  Diabetes mellitus type 2:   At home, managed on Lantus 50 units daily at bedtime.  Blood suagrs still elevated. Increase lantus to 35 units along with humalog 5 units tid.    6.  Hypertension:  Blood pressure seems to be fairly controlled.  Continue Coreg, Lasix.    7.  History of sleep disorder breathing:   Pulmonary consult called in.    Appreciate pulmonary, Dr. Twana First input.    8.  History of asthma: Patient seems to be stable.  No wheezing.  Does seem to be shortness of breath with minimal exertion.    9.  Episode of nonsustained ventricular tachycardia; electrolytes are within normal limits.  Patient does have automatic implantable cardiac defibrillator in  place.  Patient was not symptomatic when he had the episode on12/1/18 at 3:05 PM, lasted for 12 seconds./30 beats      DVT Prohylaxis:heparin and SEDs   Central Line/Foley Catheter/PICC line status: none  Code Status: Full Code  Disposition:to be determined  Type of Admission:Inpatient  Expected Date of Discharge: 2 midnights atleast  Milestones required for discharge:achieve euvolemic stage, not SOB with min exertion, creatinine basline with po meds.   Hospital Problems:   Active Problems:    Non-ischemic cardiomyopathy    Acute exacerbation of CHF (congestive heart failure)    Elevated LFTs    Acute dyspnea    Essential hypertension    Cardiomyopathy    AKI (acute kidney injury)    SOB (shortness of breath)    Acute on chronic systolic CHF (congestive heart failure)    Subjective:   09/19/17 continues to complain of shortness of breath with minimal exertion. Abdominal discomfort has improved, swelling in the lower extremities has improved slightly.  Chief Complaint:  Shortness of Breath and Congestive Heart Failure    Review of Systems   Constitutional: Negative for chills, fever and malaise/fatigue.   Respiratory: Positive for cough and shortness of breath. Negative for sputum production and wheezing.    Cardiovascular: Positive for leg swelling. Negative for chest pain and palpitations.   Gastrointestinal: Negative for abdominal pain,  constipation, nausea and vomiting.   Genitourinary: Negative for dysuria.   Musculoskeletal: Negative for myalgias.   Neurological: Negative for dizziness, sensory change, focal weakness and weakness.   Psychiatric/Behavioral: The patient is nervous/anxious.      Objective:     Vitals:    09/19/17 0101 09/19/17 0544 09/19/17 0804 09/19/17 0947   BP: 102/67 121/83  126/90   Pulse: 93 90 94 92   Resp: 19 18 18 14    Temp: 97.7 F (36.5 C) 97.2 F (36.2 C)  97.8 F (36.6 C)   TempSrc: Axillary Temporal Artery  Temporal Artery   SpO2: 96% 97% 97% 98%   Weight:  92.6 kg (204 lb 3.2  oz)     Height:         Physical Exam:   Physical Exam   Constitutional: He appears not malnourished and not dehydrated. He appears unhealthy.  Non-toxic appearance. He does not have a sickly appearance.   Elderly male in mild resp distress with min exertion.    HENT:   Head: Normocephalic and atraumatic.   Neck: Normal range of motion. Neck supple. JVD present.   Cardiovascular: Normal rate and regular rhythm.    Murmur heard.  Pulmonary/Chest: He is in respiratory distress. He has no wheezes. He has rales. He exhibits no tenderness.   Abdominal: Soft. Bowel sounds are normal. He exhibits distension. There is no tenderness. There is no rebound and no guarding.   Musculoskeletal: Normal range of motion. He exhibits edema. He exhibits no deformity.   Psychiatric: His mood appears anxious.     Results of Labs/imaging   Labs and radiology reports have been reviewed.    Hospitalist   Signed by:   Camillo Flaming  09/19/2017 10:44 AM    *This note was generated by the Epic EMR system/ Dragon speech recognition and may contain inherent errors or omissions not intended by the user. Grammatical errors, random word insertions, deletions, pronoun errors and incomplete sentences are occasional consequences of this technology due to software limitations. Not all errors are caught or corrected. If there are questions or concerns about the content of this note or information contained within the body of this dictation they should be addressed directly with the author for clarification

## 2017-09-19 NOTE — Plan of Care (Signed)
Problem: Heart Failure  Goal: Stable vital signs and fluid balance  Outcome: Progressing   09/19/17 1132   Goal/Interventions addressed this shift   Stable vital signs and fluid balance Monitor/assess vital signs and telemetry per unit protocol;Weigh on admission and record weight daily;Assess signs and symptoms associated with cardiac rhythm changes;Monitor lab values     Goal: Mobility/Activity is maintained at optimal level for patient  Outcome: Progressing

## 2017-09-19 NOTE — Progress Notes (Addendum)
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  -DM II with CKD  -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    Still dyspnea with minimal exertion,c/o dyspnea with any walking or activity in rehab also since last discharge  No Chest pain  No Abdominal pain  No Nausea/Vomiting  No Diarrhea      Objective:   Vital signs in last 24 hours:  Temp:  [97.2 F (36.2 C)-98 F (36.7 C)] 97.8 F (36.6 C)  Heart Rate:  [90-98] 92  Resp Rate:  [14-22] 14  BP: (102-129)/(67-91) 126/90  FiO2:  [30 %] 30 %     General:    In NAD,conversational dyspnea    HEENT:   Conjunctiva pale, Dry mucosa          CVS:  RRR, no rub        Chest: still rales at bases  Abdomen:   Soft, nontender           Ext.:   No edema      Intake/Output:   Intake/Output last 24 hours:    Intake/Output Summary (Last 24 hours) at 09/19/17 1041  Last data filed at 09/19/17 0200   Gross  per 24 hour   Intake             1136 ml   Output             2220 ml   Net            -1084 ml     Intake/Output this shift:  No intake/output data recorded.      Labs:       Recent Labs  Lab 09/19/17  0439 09/19/17  0438 09/18/17  0614 09/17/17  0810 09/16/17  2354 09/16/17  1430 09/16/17  1429   Sodium 140  --  139 141  --   --  139   Potassium 4.1  --  3.7 4.0 3.8  --  5.2*   Chloride 102  --  105  107  --   --  108   CO2 26  --  25 24  --   --  19*   BUN 20.3  --  22.2 20.2  --   --  23.9   Creatinine 1.3  --  1.4* 1.3  --   --  1.7*   Glucose 224*  --  158* 127*  --   --  200*   Calcium 8.6  --  8.4 8.9  --   --  9.2   Magnesium  --  1.9  --  2.2 1.5* 2.1  --    Phosphorus  --   --   --   --   --  4.3  --    EGFR >60.0  --  >60.0 >60.0  --   --  48.4       Recent Labs  Lab 09/19/17  0439 09/18/17  0614 09/17/17  0810   WBC 7.96 8.45 8.14   Hgb 13.6 12.8* 13.2   Hematocrit 41.7* 39.6* 39.5*   Platelets 156 141 143                   Medications:   Scheduled Meds:       amitriptyline 25 mg Oral QHS   atorvastatin 40 mg Oral QHS   carvedilol 6.25 mg Oral Q12H SCH   fluticasone-salmeterol 1 puff Inhalation BID   furosemide 80 mg Intravenous BID   heparin (porcine) 5,000 Units Subcutaneous Q12H SCH   insulin glargine 25 Units Subcutaneous QHS   insulin lispro 5 Units Subcutaneous TID AC   niacin 500 mg Oral QHS   pantoprazole 40 mg Oral QAM AC   tamsulosin 0.4 mg Oral QD after dinner         Continuous Infusions:        PRN Meds:acetaminophen, albuterol, bisacodyl, Nursing communication: Adult Hypoglycemia Treatment Algorithm **AND** dextrose **AND** dextrose **AND** glucagon (rDNA), insulin lispro, insulin lispro

## 2017-09-19 NOTE — Progress Notes (Addendum)
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  ? Normal MPI September of 2017.    Status post St. Jude primary prevention ICD,  upgraded to a Medtronic BiV ICD device in January of 2018.   History of syncope in the past that has never been proven to represent ventricular arrhythmias by device interrogations. 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    Hypertension, well controlled.    Type 2 diabetes mellitus, on insulin.    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    Recommendations:    Continue diuresis, increase to 80 mg IV BID, fluid restriction in place along with Na+ restriction   Continue daily standing weights, strict I/Os   Continue Coreg for CMP, ACE contraindicated due to CKD requiring recent CRRT   Reasonable to repeat ECHO to re-eval LV function (can be done Monday)   Check Mag   Increase activity    May be ready to change to PO tomorrow    Medications:      Scheduled Meds: PRN Meds:      amitriptyline 25 mg Oral QHS   atorvastatin 40 mg Oral QHS   carvedilol 6.25 mg Oral Q12H SCH   fluticasone-salmeterol 1 puff Inhalation BID   furosemide 60 mg Intravenous BID   heparin (porcine) 5,000 Units Subcutaneous Q12H SCH   insulin glargine 25 Units Subcutaneous QHS   insulin lispro 5 Units Subcutaneous TID AC   niacin 500 mg Oral QHS   pantoprazole 40 mg Oral QAM AC   tamsulosin 0.4 mg Oral QD after dinner       Continuous Infusions:     acetaminophen 650 mg Q4H PRN   albuterol 2.5 mg BID PRN   bisacodyl 10 mg QD PRN   dextrose 15 g of glucose PRN   And     dextrose 12.5 g  PRN   And     glucagon (rDNA) 1 mg PRN   insulin lispro 1-3 Units QHS PRN   insulin lispro 1-5 Units TID AC PRN             Subjective:   Denies chest pain or palpitations. Pt feels his breathing is improving but has only walked to bathroom      Physical Exam:     Vitals:    09/19/17 0804   BP:    Pulse: 94   Resp: 18   Temp:    SpO2: 97%     Temp (24hrs), Avg:97.6 F (36.4 C), Min:97.2 F (36.2 C), Max:98 F (36.7 C)      Telemetry reviewed, 6 beat NSVT, otherwise V paced     Intake and Output Summary (Last 24 hours) at Date Time    Intake/Output Summary (Last 24 hours) at 09/19/17 0825  Last data filed at 09/19/17 0200   Gross per 24 hour   Intake             1490 ml   Output  2470 ml   Net             -980 ml       General Appearance:  Breathing comfortable, no acute distress  Head:  normocephalic  Eyes:  EOM's intact  Neck:  + jugular venous distension  Lungs:  diminished at bases good respiratory effort   Chest Wall:  No tenderness or deformity  Heart:  S1, S2 normal, no S3, no S4, no murmur, PMI not displaced, no rub   Abdomen:  Soft, non-tender, positive bowel sounds, + HJR  Extremities:  + edema  Pulses:  Equal radial pulses, 4/4 symmetric  Neurologic:  Alert and oriented x3, mood and affect normal  Musculoskeletal: normal strength and tone    Labs:     Recent Labs  Lab 09/17/17  0810 09/16/17  2354 09/16/17  1429   Troponin I 0.10* 0.11* 0.11*               Recent Labs  Lab 09/18/17  0614   Bilirubin, Total 1.6*   Bilirubin, Direct 0.7*   Protein, Total 5.3*   Albumin 3.1*   ALT 82*   AST (SGOT) 35*       Recent Labs  Lab 09/17/17  0810   Magnesium 2.2       Recent Labs  Lab 09/17/17  0810   PT 17.0*   PT INR 1.4*   PTT 31       Recent Labs  Lab 09/19/17  0439 09/18/17  0614 09/17/17  0810   WBC 7.96 8.45 8.14   Hgb 13.6 12.8* 13.2   Hematocrit 41.7* 39.6* 39.5*   Platelets 156 141 143       Recent Labs  Lab 09/19/17  0439 09/18/17  0614 09/17/17  0810   Sodium 140 139 141   Potassium 4.1 3.7  4.0   Chloride 102 105 107   CO2 26 25 24    BUN 20.3 22.2 20.2   Creatinine 1.3 1.4* 1.3   EGFR >60.0 >60.0 >60.0   Glucose 224* 158* 127*   Calcium 8.6 8.4 8.9           Invalid input(s): FREET4    .  Lab Results   Component Value Date    BNP 956.2 (H) 09/18/2017      Estimated Creatinine Clearance: 58 mL/min (based on SCr of 1.3 mg/dL).    Weight Monitoring 08/29/2017 08/30/2017 09/16/2017 09/16/2017 09/17/2017 09/18/2017 09/19/2017   Height - - 182.9 cm 182.9 cm - - -   Height Method - - Stated - - - -   Weight 104.3 kg 104.01 kg 104 kg 98.2 kg 98 kg 93.214 kg 92.625 kg   Weight Method Bed Scale Bed Scale Stated Bed Scale Bed Scale Standing Scale Standing Scale   BMI (calculated) - - 31.2 kg/m2 29.4 kg/m2 - - -         Imaging:   Radiological Procedure reviewed.              Signed by: Cindee Lame, NP    Patient seen and examined.  Agree with NP/PA note, exam, and plan as above with changes in italics.    Katalyn Matin A. Marnette Burgess, MD, [facility]    [facility]  NP Spectralink [phone] (8am-5pm)  MD Spectralink [phone] (8am-5pm)  After hours, non urgent consult line [phone]  After Hours, urgent consults [phone]

## 2017-09-19 NOTE — Plan of Care (Signed)
Problem: Safety  Goal: Patient will be free from injury during hospitalization  Outcome: Progressing   09/19/17 0550   Goal/Interventions addressed this shift   Patient will be free from injury during hospitalization  Assess patient's risk for falls and implement fall prevention plan of care per policy;Provide and maintain safe environment;Use appropriate transfer methods;Ensure appropriate safety devices are available at the bedside;Include patient/ family/ care giver in decisions related to safety;Hourly rounding;Assess for patients risk for elopement and implement Elopement Risk Plan per policy       Problem: Heart Failure  Goal: Stable vital signs and fluid balance  Outcome: Progressing   09/19/17 0550   Goal/Interventions addressed this shift   Stable vital signs and fluid balance Monitor/assess vital signs and telemetry per unit protocol;Weigh on admission and record weight daily;Assess signs and symptoms associated with cardiac rhythm changes;Monitor intake/output per unit protocol and/or LIP order;Monitor lab values;Monitor for leg swelling/edema and report to LIP if abnormal     VSS. Pt denies chest pain and SOB; dyspnea on exertion.   Goal: Mobility/Activity is maintained at optimal level for patient  Outcome: Progressing   09/19/17 0550   Goal/Interventions addressed this shift   Mobility/activity is maintained at optimal level for patient Increase mobility as tolerated/progressive mobility;Encourage independent activity per ability;Plan activities to conserve energy, plan rest periods;Assess for changes in respiratory status, level of consciousness and/or development of fatigue

## 2017-09-19 NOTE — PT Eval Note (Signed)
[facility]  [address]  [place], [place]. [address]    Department of Rehabilitation  [phone]    Physical Therapy Evaluation    Patient: [name] [name]    MRN#: [id number]     [id number]/[id number]    Time of treatment: Time Calculation  PT Received On: 09/19/17  Start Time: 1544  Stop Time: 1602  Time Calculation (min): 18 min    PT Visit Number: 1    Consult received for [name] [name] for PT Evaluation and Treatment.  Patient's medical condition is appropriate for Physical therapy intervention at this time.      Assessment:   [name] [name] is a 71 y.o. male admitted 09/16/2017.  Pt's functional mobility is impacted by:  decreased activity tolerance, decreased balance, gait impairment and decreased strength.  There are a few comorbidities or other factors that affect plan of care and require modification of task including: has stairs to manage.  Standardized tests and exams incorporated into evaluation include AMPAC mobility, ROM  and Strength.  Pt demonstrates a stable clinical presentation.   Pt would continue to benefit from PT to address these deficits and increase functional independence.     Complexity Level Hx and Co  morbidites Examination Clinical Decision Making Clinical Presentation   Low no impact 1-2 elements Limited options Stable   Moderate   1-2 factors 3 or more   Several options Evolving, plan may alter   High 3 or more 4 or more Multiple options Unstable, unpredictable       Impairments: Assessment: Decreased LE strength;Decreased endurance/activity tolerance;Decreased functional mobility;Decreased balance;Gait impairment.     Therapy Diagnosis: generalized weakness, decreased functional mobility , decreased independence with ADL's, increased gait dysfunction and decreased endurance/ activity engagement due to CHF. Without therapy interventions, patient is at risk for falls, dependence on caregivers for mobility, dependence on caregivers for ADL's and decreased  independence.    Rehabilitation Potential: Prognosis: Good;With continued PT status post acute discharge      Plan:    Treatment/Interventions: Exercise;Gait training;Stair training;Neuromuscular re-education;Functional transfer training;LE strengthening/ROM;Endurance training PT Frequency: 2-3x/wk    Risks/Benefits/POC Discussed with Pt/Family: With patient/family          Goals:   Goals  Goal Formulation: With patient/family  Time for Goal Acheivement: 3 visits  Pt Will Go Supine To Sit: to maximize functional mobility and independence;independent  Pt Will Perform Sit to Stand: independent;to maximize functional mobility and independence  Pt Will Transfer Bed/Chair: with supervision;to maximize functional mobility and independence  Pt Will Ambulate: > 200 feet;with stand by assist;to maximize functional mobility and independence  Pt Will Go Up / Down Stairs: 6-10 stairs;with stand by assist;to maximize functional mobility and independence      Discharge Recommendations:   Based on today's session patient's discharge recommendation is the following: Discharge Recommendation: Home with supervision;Home with home health PT     DME Recommended for Discharge:  (TBD)      Precautions and Contraindications:   Precautions  Other Precautions: falls, EF 20-30%    Medical Diagnosis: Cough [R05]  SOB (shortness of breath) [R06.02]  Elevated troponin I level [R74.8]  Acute on chronic systolic CHF (congestive heart failure) [I50.23]    History of Present Illness: [name] [name] is a 71 y.o. male admitted on 09/16/2017 from cardiologist office with CHF exacerbation. Patient still at [facility] for rehab from prior hospitalization at beginning of Nov.     Patient Active Problem List   Diagnosis   .  Chest pain   . Diabetes mellitus   . Heart attack   . CAD (coronary artery disease)   . Non-ischemic cardiomyopathy   . Acute exacerbation of CHF (congestive heart failure)   . PUD (peptic ulcer disease)   . Elevated LFTs   .  Acute dyspnea   . Elevated d-dimer   . CHF (congestive heart failure)   . Headaches due to old head injury   . Essential hypertension   . Pneumonia due to infectious organism, unspecified laterality, unspecified part of lung   . Syncope and collapse   . AICD (automatic cardioverter/defibrillator) present   . Syncope, unspecified syncope type   . Pneumonia due to infectious organism   . Dyspnea, unspecified type   . Cardiomyopathy   . Cough   . Sepsis   . AKI (acute kidney injury)   . Sepsis, due to unspecified organism   . SOB (shortness of breath)   . Acute on chronic systolic CHF (congestive heart failure)        Past Medical/Surgical History:  Past Medical History:   Diagnosis Date   . AICD (automatic cardioverter/defibrillator) present 2014   . CAD (coronary artery disease)    . Cardiac defibrillator in situ    . CHF (congestive heart failure) 08/12/2014   . Diabetes mellitus    . Heart attack 2011    and 2014   . Hyperlipemia    . Hypertension    . Ischemic cardiomyopathy     20%   . Pneumonia 09/2016      Past Surgical History:   Procedure Laterality Date   . CARDIAC CATHETERIZATION     . CARDIAC DEFIBRILLATOR PLACEMENT     . CARDIAC PACEMAKER PLACEMENT     . CIRCUMCISION     . CORONARY STENT PLACEMENT  2011   . duodenal ulcer  1973   . EGD N/A 08/15/2014    Procedure: EGD;  Surgeon: Colon Branch, MD;  Location: [facility];  Service: Gastroenterology;  Laterality: N/A;  egd w/ bx   . HERNIA REPAIR      [facility]   . orthopedic surgery      R foot corrective   . TONSILLECTOMY AND ADENOIDECTOMY  1954         X-Rays/Tests/Labs:  CXR 11/29  Cardiomegaly with findings suggestive of pulmonary edema.    Social History:  Prior Level of Function  Prior level of function: Ambulates with assistive device, Needs assistance with ADLs (was independent prior to Oct 2018)  Assistive Device: Front wheel walker  Baseline Activity Level: Community ambulation (prior to Oct 2018)  Driving: independent  Cooking: light meal  prep  DME Currently at Home: Other (Comment) (Pnt using DME at facility walker and w/c.  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 )  Home Living Arrangements  Living Arrangements: Spouse/significant other  Type of Home: House  Home Layout: Multi-level, Bed/bath upstairs  Bathroom Shower/Tub: Event organiser: Paediatric nurse  DME Currently at Home: Other (Comment) (Pnt using DME at facility walker and w/c.  Pnt states he has no DME at home except a work out pulley. )      Subjective:    Patient is agreeable to participation in the therapy session. Family and/or guardian are agreeable to patient's participation in the therapy session. Nursing clears patient for therapy.   Patient Goal  Patient Goal: go home   Pain Assessment  Pain Assessment: No/denies  pain    Objective:   Observation of Patient/Vital Signs:  Patient is seated in a cardiac chair with telemetry, continuous pulse ox, PIV access, 3L O2 via NC in place.         Cognition/Neuro Status  Arousal/Alertness: Appropriate responses to stimuli  Attention Span: Appears intact  Orientation Level: Oriented X4  Memory: Appears intact  Following Commands: Follows all commands and directions without difficulty  Safety Awareness: minimal verbal instruction  Insights: Fully aware of deficits  Problem Solving: supervision      Hearing: WNL  Vision: wears glasses  Sensation: WNL    Gross ROM  Right Lower Extremity ROM: within functional limits  Left Lower Extremity ROM: within functional limits  Gross Strength  Right Lower Extremity Strength: 4-/5  Left Lower Extremity Strength: 4-/5  Tone  Tone: within functional limits    Functional Mobility  Sit to Stand: Stand by Assist  Stand to Sit: Stand by Assist     Locomotion  Ambulation: Stand by Assist;Contact Guard Assist  Pattern: R foot decreased clearance;L foot decreased clearance;R foot flat;L foot flat;decreased step length;decreased cadence  Distance Walked (ft) (Step 6,7): 60 Feet  PMP  Activity: Step 6 - Walks in Room     Balance  Sitting - Static: Good  Sitting - Dynamic: Good  Standing - Static: Good  Standing - Dynamic: Fair    Participation and Endurance  Participation Effort: good  Endurance: Tolerates 30 min exercise with multiple rests         AM-PACT Inpatient Short Forms  Inpatient AM-PACT Performed? (PT): Basic Mobility Inpatient Short Form  AM-PACT "6 Clicks" Basic Mobility Inpatient Short Form  Turning Over in Bed: None  Sitting Down On/Standing From Armchair: None  Lying on Back to Sitting on Side of Bed: None  Assist Moving to/from Bed to Chair: None  Assist to Walk in Hospital Room: A little  Assist to Climb 3-5 Steps with Railing: A little  PT Basic Mobility Raw Score: 22  CMS 0-100% Score: 20.91%    Treatment Activities: Patient just returning back to room after ambulating in halls with RN staff upon PT's arrival, agreeable to ambulate in room with PT for evaluation. Tolerated PT evaluation well, wife impressed with how well patient ambulating without AD.  Discussed need to increase activity to maintain strength in order for patient to d/c home with HHPT versus having to return to rehab. Patient very motivated to return home. Reviewed HEP and educated on importance of maintaining strength and endurance while in house by ambulating with RN staff, as well as eating all meals OOB and minimizing time in bed; pt agreeable.  Pt educated on importance of call bell, especially when getting OOB or out of chair.  Pt left sitting in chair with all needs met, call bell, phone, and bedside table next to patient.       Educated the patient to role of physical therapy, plan of care, goals of therapy and HEP, safety with mobility and ADLs.    Jorge Mandril, PT, DPT

## 2017-09-20 ENCOUNTER — Other Ambulatory Visit (INDEPENDENT_AMBULATORY_CARE_PROVIDER_SITE_OTHER): Payer: Self-pay

## 2017-09-20 ENCOUNTER — Inpatient Hospital Stay: Payer: Commercial Managed Care - POS

## 2017-09-20 ENCOUNTER — Ambulatory Visit (INDEPENDENT_AMBULATORY_CARE_PROVIDER_SITE_OTHER): Payer: Self-pay

## 2017-09-20 LAB — BASIC METABOLIC PANEL
Anion Gap: 14 (ref 5.0–15.0)
BUN: 17.7 mg/dL (ref 9.0–28.0)
CO2: 24 mEq/L (ref 22–29)
Calcium: 9.1 mg/dL (ref 7.9–10.2)
Chloride: 101 mEq/L (ref 100–111)
Creatinine: 1.3 mg/dL (ref 0.7–1.3)
Glucose: 201 mg/dL — ABNORMAL HIGH (ref 70–100)
Potassium: 4.9 mEq/L (ref 3.5–5.1)
Sodium: 139 mEq/L (ref 136–145)

## 2017-09-20 LAB — GLUCOSE WHOLE BLOOD - POCT
Whole Blood Glucose POCT: 137 mg/dL — ABNORMAL HIGH (ref 70–100)
Whole Blood Glucose POCT: 231 mg/dL — ABNORMAL HIGH (ref 70–100)
Whole Blood Glucose POCT: 237 mg/dL — ABNORMAL HIGH (ref 70–100)
Whole Blood Glucose POCT: 238 mg/dL — ABNORMAL HIGH (ref 70–100)

## 2017-09-20 LAB — GFR: EGFR: >60

## 2017-09-20 MED ORDER — FUROSEMIDE 10 MG/ML IJ SOLN
80.00 mg | Freq: Once | INTRAMUSCULAR | Status: DC | PRN
Start: 2017-09-20 — End: 2017-09-21

## 2017-09-20 MED ORDER — FUROSEMIDE 10 MG/ML IJ SOLN
60.00 mg | Freq: Two times a day (BID) | INTRAMUSCULAR | Status: DC
Start: 2017-09-20 — End: 2017-09-20

## 2017-09-20 MED ORDER — FUROSEMIDE 80 MG PO TABS
80.00 mg | ORAL_TABLET | Freq: Two times a day (BID) | ORAL | Status: DC
Start: 2017-09-21 — End: 2017-09-21
  Administered 2017-09-21 (×2): 80 mg via ORAL
  Filled 2017-09-20 (×2): qty 1

## 2017-09-20 MED ORDER — FUROSEMIDE 10 MG/ML IJ SOLN
80.00 mg | Freq: Once | INTRAMUSCULAR | Status: DC
Start: 2017-09-20 — End: 2017-09-20

## 2017-09-20 MED ORDER — FUROSEMIDE 10 MG/ML IJ SOLN
80.00 mg | Freq: Two times a day (BID) | INTRAMUSCULAR | Status: DC
Start: 2017-09-20 — End: 2017-09-20
  Administered 2017-09-20: 80 mg via INTRAVENOUS

## 2017-09-20 MED ORDER — INSULIN GLARGINE 100 UNIT/ML SC SOLN
45.00 [IU] | Freq: Every evening | SUBCUTANEOUS | Status: DC
Start: 2017-09-20 — End: 2017-09-21
  Administered 2017-09-20: 45 [IU] via SUBCUTANEOUS
  Filled 2017-09-20: qty 45

## 2017-09-20 NOTE — Progress Notes (Signed)
[name]OSPITAL[facility]  Progress Note  Patient Info:   Date/Time: 09/20/2017 / 4:16 PM   Admit Date:09/16/2017  Patient Name:[name] [name]   UXL:[id number]   PCP: Zorita Pang, MD  Attending Physician:Malavika Lira, Melina Copa, MD     Assessment and Plan:   71 y/o male with past h/o severe ischemic cardiomyopathy CAD,CHF,DM,HTN, came in with a chief C/o Shortness of breath and fluid overload.  Severe ischemic cardiomyopathy with EF of 30%. Per Echo on 11/2016.   Volume overloaded.   Seen at [facility] office and sent to ER.   Shortness of breath with minimal exertion.    1.  Acute on chronic systolic congestive heart failure: Had echocardiogram done today.  His weight has decreased to 89.1 kg today.  He lost nearly 8 pounds when compared to yesterday.  Continue diuresis as per the recommendations of cardiology.  Patient is currently on 80 mg of IV Lasix.  Most likely he would need higher dose of either Lasix or torsemide at the time of discharge.  He was on Lasix 40 mg by mouth daily at home  Daily weights, strict I's and O's,.  Patient is not a candidate for Ace inhibitors due to his chronic kidney disease and prior history of CRRT.  Continue with Coreg.  Awaiting results.    2. CAD:   Cardiac cath 2014 with nonobstructive coronary artery disease.  September 2017, with normal nuclear stress test.  Elevated trop with demand ischemia.  Lipitor, Coreg, niacin.    3. 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     4.  Chronic kidney disease: On CRRT recently.  Appreciate Dr. Roswell Nickel input.   Agree with decreasing the dose of lasix. Renal function much better.  Creatinine stable at 1.3 even with aggressive diuresis    5.  Diabetes mellitus type 2:   At home, managed on Lantus 50 units daily at bedtime.  Blood suagrs still elevated. Increase lantus to 35 units along with humalog 5 units tid.  We will increase the dose of Lantus to 45 minutes today as his blood sugars are still elevated.  Most  likely he was requiring less amount of Lantus due to acute kidney injury. with improvement in his kidney function he most likely will need 50 units of Lantus at the time of discharge    6.  Hypertension:  Blood pressure seems to be fairly controlled.  Continue Coreg, Lasix.    7.  History of sleep disorder breathing:   Pulmonary consult called in.    Appreciate pulmonary, Dr. Twana First input.    8.  History of asthma: Patient seems to be stable.  No wheezing.  Does seem to be shortness of breath with minimal exertion.    9.  Episode of nonsustained ventricular tachycardia; electrolytes are within normal limits.  Patient does have automatic implantable cardiac defibrillator in place.  Patient was not symptomatic when he had the episode on12/1/18 at 3:05 PM, lasted for 12 seconds./30 beats      DVT Prohylaxis:heparin and SEDs   Central Line/Foley Catheter/PICC line status: none  Code Status: Full Code  Disposition:to be determined  Type of Admission:Inpatient  Expected Date of Discharge: 2 midnights atleast  Milestones required for discharge:achieve euvolemic stage, not SOB with min exertion, creatinine basline with po meds.   Hospital Problems:   Active Problems:    Non-ischemic cardiomyopathy    Acute exacerbation of CHF (congestive heart failure)    Elevated LFTs    Acute dyspnea  Essential hypertension    Cardiomyopathy    AKI (acute kidney injury)    SOB (shortness of breath)    Acute on chronic systolic CHF (congestive heart failure)    Subjective:   09/20/17 patient says he is feeling much better today.  Swelling has improved when compared to yesterday.  Currently, the oxygen.  Did not require oxygen, even with angulation.  Chief Complaint:  Shortness of Breath and Congestive Heart Failure    Review of Systems   Constitutional: Negative for chills, fever and malaise/fatigue.   Respiratory: Positive for cough. Negative for sputum production, shortness of breath and wheezing.         Minimal shortness of breath  with ambulation   Cardiovascular: Positive for leg swelling. Negative for chest pain and palpitations.   Gastrointestinal: Negative for abdominal pain, constipation, nausea and vomiting.   Genitourinary: Negative for dysuria.   Musculoskeletal: Negative for myalgias.   Neurological: Negative for dizziness, sensory change, focal weakness and weakness.   Psychiatric/Behavioral: The patient is nervous/anxious.      Objective:     Vitals:    09/20/17 0803 09/20/17 1020 09/20/17 1034 09/20/17 1327   BP:   113/69 122/80   Pulse: 86  100 98   Resp: 18  18 16    Temp:   97.8 F (36.6 C) 97.3 F (36.3 C)   TempSrc:   Temporal Artery    SpO2:  98%  98%   Weight:       Height:         Physical Exam:   Physical Exam   Constitutional: He appears not malnourished and not dehydrated. He appears healthy.  Non-toxic appearance. He does not have a sickly appearance.   HENT:   Head: Normocephalic and atraumatic.   Neck: Normal range of motion. Neck supple. JVD present.   Cardiovascular: Normal rate and regular rhythm.    Murmur heard.  Pulmonary/Chest: No respiratory distress. He has no wheezes. He has rales in the right lower field. He exhibits no tenderness.   Abdominal: Soft. Bowel sounds are normal. He exhibits distension. There is no tenderness. There is no rebound and no guarding.   Musculoskeletal: Normal range of motion. He exhibits edema. He exhibits no deformity.   Psychiatric: His mood appears anxious.     Results of Labs/imaging   Labs and radiology reports have been reviewed.    Hospitalist   Signed by:   Camillo Flaming  09/20/2017 4:16 PM    *This note was generated by the Epic EMR system/ Dragon speech recognition and may contain inherent errors or omissions not intended by the user. Grammatical errors, random word insertions, deletions, pronoun errors and incomplete sentences are occasional consequences of this technology due to software limitations. Not all errors are caught or corrected. If there are questions or  concerns about the content of this note or information contained within the body of this dictation they should be addressed directly with the author for clarification

## 2017-09-20 NOTE — Progress Notes (Signed)
[facility] [place]                                                            Progress Note    Assessment:   -ZOX:[id number] syndrome:improved with diuresis  -hyperkalemia:resolved  -Acute resp failure:improved  -hypertensive urgency:improved  -Acute decompensated systolic heart failure superimposed on chronic HF  -Mild met acidosis:improved  -dilated CM S/P ICD with EF of 20%-->305 in feb 2018 and now 15%  -Elevated bili and XBJ:YNWGNF passive congestion  -Recent AKI with decompensated CHF and resp failure with requiring CRRT transiently  -Hx of acute urinary retention in recent admission  -Long standing HTN  -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  -Would leave to the cardiology discretion for any management changes  -He can certainly go back to ACEI/ARB as it is beneficial with low EF as renal fx improved  -Lasix 80 mg in am and 40 mg in pm no later than 4 pm  -Need to weigh himself daily and low sodium diet is emphasized  -Will see him office in 2 weeks for follow up    [name] [name], M.D  [facility]  [phone]  Subjective:     He reports feeling better today  No Chest pain  No Abdominal pain  No Nausea/Vomiting  No Diarrhea      Objective:   Vital signs in last 24 hours:  Temp:  [97 F (36.1 C)-98.8 F (37.1 C)] 97 F (36.1 C)  Heart Rate:  [86-101] 101  Resp Rate:  [16-24] 24  BP: (108-124)/(69-83) 119/81     General:    In NAD    HEENT:   Conjunctiva pale, Dry mucosa          CVS:  RRR, no rub        Chest:  Rales but improved  Abdomen:   Soft, nontender           Ext.:   1-2+ edema b/l and is posterior      Intake/Output:   Intake/Output last 24 hours:    Intake/Output Summary (Last 24 hours) at 09/20/17 1816  Last data filed at 09/20/17 1500   Gross per 24 hour   Intake             2234 ml   Output             1125 ml   Net              1109 ml     Intake/Output this shift:  I/O this shift:  In: 1184 [P.O.:1184]  Out: 250 [Urine:250]      Labs:       Recent Labs  Lab 09/20/17  1047 09/19/17  0439 09/19/17  0438 09/18/17  0614 09/17/17  0810 09/16/17  2354 09/16/17  1430 09/16/17  1429   Sodium 139 140  --  139 141  --   --  139   Potassium 4.9 4.1  --  3.7 4.0 3.8  --  5.2*   Chloride 101 102  --  105 107  --   --  108   CO2 24 26  --  25 24  --   --  19*   BUN 17.7 20.3  --  22.2 20.2  --   --  23.9   Creatinine 1.3 1.3  --  1.4* 1.3  --   --  1.7*   Glucose 201* 224*  --  158* 127*  --   --  200*   Calcium 9.1 8.6  --  8.4 8.9  --   --  9.2   Magnesium  --   --  1.9  --  2.2 1.5* 2.1  --    Phosphorus  --   --   --   --   --   --  4.3  --    EGFR >60.0 >60.0  --  >60.0 >60.0  --   --  48.4       Recent Labs  Lab 09/19/17  0439 09/18/17  0614 09/17/17  0810   WBC 7.96 8.45 8.14   Hgb 13.6 12.8* 13.2   Hematocrit 41.7* 39.6* 39.5*   Platelets 156 141 143                   Medications:   Scheduled Meds:       amitriptyline 25 mg Oral QHS   atorvastatin 40 mg Oral QHS   carvedilol 6.25 mg Oral Q12H SCH   fluticasone-salmeterol 1 puff Inhalation BID   furosemide 80 mg Intravenous BID   heparin (porcine) 5,000 Units Subcutaneous Q12H SCH   insulin glargine 45 Units Subcutaneous QHS   insulin lispro 5 Units Subcutaneous TID AC   niacin 500 mg Oral QHS   pantoprazole 40 mg Oral QAM AC   tamsulosin 0.4 mg Oral QD after dinner         Continuous Infusions:        PRN Meds:acetaminophen, albuterol, bisacodyl, Nursing communication: Adult Hypoglycemia Treatment Algorithm **AND** dextrose **AND** dextrose **AND** glucagon (rDNA), insulin lispro, insulin lispro

## 2017-09-20 NOTE — Consults (Signed)
Nutritional Support Services  Nutrition Education    [name] [name] 71 y.o. male  MRN [id number]    Referral Source : MD     Consult received to educate pt on : CHF     Diet history and/or patient's prior knowledge of diet principles: Pt reports that he was trying to follow lower sodium diet at home- noted that he was educated regarding low sodium diet and DM recently. Pt reports that he generally drinks coffee in the AM, small diet coke + 2 L water and sips of water in between class. Doing weights intermittently at home.  Reports that his wife's family uses bacon a lot for cooking. Gets fast food on the way home for convenience.     Handouts provided to patient : Living Well with Heart Failure booklet from [employer]     Diet guidelines reviewed verbally with patient :  Discussed importance of sodium restriction, fluid restriction, and daily weights. Reviewed dietary sources of sodium and fluid. Discussed strategies to manage fluid and sodium intake throughout the day. Reviewed with him challenges to stay below sodium guidelines while eating fast food as majority of meals. Discussed that he can look up menus online for a majority of fast food or chain restaurants and discussed ways to order to help to have lower sodium meals. Reviewed lower sodium substitutions from the [employer] booklet and ideas for lower sodium items while eating out at different genre restaurants. Reviewed importance of maintaining appropriate sodium restriction even with holidays coming up and encouraged him to bring some lower sodium sides of options he can enjoy if celebrating with his family or friends.     Level of understanding shown :fair    Expected compliance : fair     Waylan Boga, RDN, CNSC  Spectralink : [phone]  Office: [phone]

## 2017-09-20 NOTE — Progress Notes (Signed)
While ambulating, patient sat'ed 98% on room air. Will remove oxygen and continue as needed.

## 2017-09-20 NOTE — Progress Notes (Addendum)
[facility]  PROGRESS NOTE  [facility]    Date Time: 09/20/17 9:31 AM  Patient Name: [name] [name], [name] [name]       Assessment:      Acute on chronic decompensated systolic CHF   Flat troponin secondary to demand ischemia, not ACS   CKD, most recent admission pt recieved CRRT therapy. 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  ? Normal MPI September of 2017.    Status post [employer] Jude primary prevention ICD,  upgraded to a Medtronic BiV ICD device in January of 2018.   History of syncope in the past that has never been proven to represent ventricular arrhythmias by device interrogations. 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    Hypertension, well controlled.    Type 2 diabetes mellitus, on insulin.    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 Will need BID Lasix at d/c, was on Lasix 40 mg po once/day PTA   Ambulate and check pulse ox on RA-pt remains on 3L NC oxygen   Nutrition consult pending. Spent about 15 min discussing low sodium diet with pt. He had many questions about Na+ content in fast food meals. He tends to eat fast food a lot, specifically [employer] and [employer], also likes diet coke. He was advised to try to avoid fast food restaurants and sodas in general.   Repeat echo pending to re-eval LV function      Medications:      Scheduled Meds: PRN Meds:      amitriptyline 25 mg Oral QHS   atorvastatin 40 mg Oral QHS   carvedilol 6.25 mg Oral Q12H SCH   fluticasone-salmeterol 1 puff Inhalation BID   furosemide 80 mg Intravenous BID   heparin (porcine) 5,000 Units Subcutaneous Q12H SCH   insulin glargine 35 Units Subcutaneous QHS   insulin lispro 5 Units Subcutaneous TID AC   niacin 500 mg Oral QHS   pantoprazole 40 mg Oral QAM AC   tamsulosin 0.4 mg Oral QD after dinner        Continuous Infusions:     acetaminophen 650 mg Q4H PRN   albuterol 2.5 mg BID PRN   bisacodyl 10 mg QD PRN   dextrose 15 g of glucose PRN   And     dextrose 12.5 g PRN   And     glucagon (rDNA) 1 mg PRN   insulin lispro 1-3 Units QHS PRN   insulin lispro 1-5 Units TID AC PRN             Subjective:   Denies chest pain or palpitations. Feels his breathing is improving      Physical Exam:     Vitals:    09/20/17 0621   BP: 115/81   Pulse: 87   Resp: 19   Temp: 97.3 F (36.3 C)   SpO2: 100%     Temp (24hrs), Avg:97.9 F (36.6 C), Min:97.1 F (36.2 C), Max:98.8 F (37.1 C)      Telemetry reviewed, V paced, underlying SR, occ PVC, triplet, bigeminy     Intake and Output Summary (Last 24 hours) at Date Time    Intake/Output Summary (Last 24 hours) at 09/20/17 0931  Last data filed at 09/20/17 0103   Gross per 24 hour  Intake             1050 ml   Output             2125 ml   Net            -1075 ml       General Appearance:  Breathing comfortable, no acute distress  Head:  normocephalic  Eyes:  EOM's intact  Neck:  + jugular venous distension  Lungs:  Bibasilar rales, good respiratory effort, remains on oxygen   Chest Wall:  No tenderness or deformity  Heart:  S1, S2 normal, no S3, no S4, no murmur, PMI not displaced, no rub   Abdomen:  Soft, non-tender, positive bowel sounds, + hepatojugular reflux  Extremities:  No cyanosis. 1+ BLE edema  Pulses:  Equal radial pulses, 4/4 symmetric  Neurologic:  Alert and oriented x3, mood and affect normal  Musculoskeletal: normal strength and tone    Labs:     Recent Labs  Lab 09/17/17  0810 09/16/17  2354 09/16/17  1429   Troponin I 0.10* 0.11* 0.11*               Recent Labs  Lab 09/18/17  0614   Bilirubin, Total 1.6*   Bilirubin, Direct 0.7*   Protein, Total 5.3*   Albumin 3.1*   ALT 82*   AST (SGOT) 35*       Recent Labs  Lab 09/19/17  0438   Magnesium 1.9       Recent Labs  Lab 09/17/17  0810   PT 17.0*   PT INR 1.4*   PTT 31       Recent Labs  Lab 09/19/17  0439  09/18/17  0614 09/17/17  0810   WBC 7.96 8.45 8.14   Hgb 13.6 12.8* 13.2   Hematocrit 41.7* 39.6* 39.5*   Platelets 156 141 143       Recent Labs  Lab 09/19/17  0439 09/18/17  0614 09/17/17  0810   Sodium 140 139 141   Potassium 4.1 3.7 4.0   Chloride 102 105 107   CO2 26 25 24    BUN 20.3 22.2 20.2   Creatinine 1.3 1.4* 1.3   EGFR >60.0 >60.0 >60.0   Glucose 224* 158* 127*   Calcium 8.6 8.4 8.9           Invalid input(s): FREET4    .  Lab Results   Component Value Date    BNP 956.2 (H) 09/18/2017      Estimated Creatinine Clearance: 58 mL/min (based on SCr of 1.3 mg/dL).    Weight Monitoring 08/30/2017 09/16/2017 09/16/2017 09/17/2017 09/18/2017 09/19/2017 09/20/2017   Height - 182.9 cm 182.9 cm - - - -   Height Method - Stated - - - - -   Weight 104.01 kg 104 kg 98.2 kg 98 kg 93.214 kg 92.625 kg 89.086 kg   Weight Method Bed Scale Stated Bed Scale Bed Scale Standing Scale Standing Scale Standing Scale   BMI (calculated) - 31.2 kg/m2 29.4 kg/m2 - - - -         Imaging:   Radiological Procedure reviewed.              Signed by: Cindee Lame, NP    Patient seen and examined.  Agree with NP/PA note, exam, and plan as above with changes in italics.    Devonna Oboyle A. Marnette Burgess, MD, [facility]     Heart  NP Spectralink [phone] (8am-5pm)  MD Spectralink [phone] (8am-5pm)  After hours, non urgent consult line [phone]  After Hours, urgent consults [phone]

## 2017-09-20 NOTE — Plan of Care (Signed)
Problem: Safety  Goal: Patient will be free from injury during hospitalization  Outcome: Progressing   09/20/17 0411   Goal/Interventions addressed this shift   Patient will be free from injury during hospitalization  Assess patient's risk for falls and implement fall prevention plan of care per policy;Provide and maintain safe environment;Use appropriate transfer methods;Ensure appropriate safety devices are available at the bedside;Include patient/ family/ care giver in decisions related to safety;Hourly rounding;Assess for patients risk for elopement and implement Elopement Risk Plan per policy       Problem: Heart Failure  Goal: Stable vital signs and fluid balance  Outcome: Progressing   09/20/17 0411   Goal/Interventions addressed this shift   Stable vital signs and fluid balance Monitor/assess vital signs and telemetry per unit protocol;Weigh on admission and record weight daily;Assess signs and symptoms associated with cardiac rhythm changes;Monitor intake/output per unit protocol and/or LIP order;Monitor lab values;Monitor for leg swelling/edema and report to LIP if abnormal     Goal: Mobility/Activity is maintained at optimal level for patient  Outcome: Progressing   09/20/17 0411   Goal/Interventions addressed this shift   Mobility/activity is maintained at optimal level for patient Increase mobility as tolerated/progressive mobility;Encourage independent activity per ability;Plan activities to conserve energy, plan rest periods;Assess for changes in respiratory status, level of consciousness and/or development of fatigue

## 2017-09-20 NOTE — Progress Notes (Signed)
CM rounded with MD, discharge expected tomorrow. PT now recommends home health. Patient not interested in returning to [facility] and wants to return home with home health. Referral given to home health liaisons.     Cyndie Chime  Discharge Planner  [phone]

## 2017-09-20 NOTE — PT Progress Note (Signed)
[facility]  [address]  [place], [place]. [address]    Department of Rehabilitation  [phone]    Physical Therapy Daily Treatment Note    Patient: [name] [name]    MRN#: [id number]     [id number]/[id number]    Time of Treatment: Start Time: 0955 Stop Time: 1034 Time Calculation (min): 39 min    PT Visit Number: 2    Patient's medical condition is appropriate for Physical Therapy intervention at this time.        Precautions  Weight Bearing Status: no restrictions  Other Precautions: falls, EF 20-30%    Assessment:         Progress: Improving as expected. Patient has met short term goal for supine to sit transfers.      Pt making good progress toward goals and seems to be on track for discharge home with home PT services.  Pt continues with the following deficits: decreased LE rom, decreased LE strength, decreased balance, gait impairment, and decreased activity tolerance.         Plan:   Continue with Physical Therapy services to address strength, endurance and balance deficits.    Focus next session on transfers, dynamic standing balance activities, there ex and gait training to increase pt's functional independence and safety.    Treatment/Interventions: Exercise;Gait training;Stair training;Neuromuscular re-education;Functional transfer training;LE strengthening/ROM;Endurance training   PT Frequency: 2-3x/wk     Based on today's session patient's discharge recommendation is the following: Discharge Recommendation: Home with supervision;Home with home health PT  DME Recommended for Discharge: Single point cane          Subjective: Patient is agreeable to participation in the therapy session. Nursing clears patient for therapy.   Pain Assessment  Pain Assessment: No/denies pain     Objective:  Observation of Patient/Vital Signs:  Patient is in bed with telemetry and O2 at 3 liters/minute via nasal cannula in place. Patient's Sa O2 after 15 mins on room air while ambulating in the hallway and  performing execises was 98%.         Functional Mobility  Supine to Sit: Independent  Scooting to EOB: Independent  Sit to Stand: Supervision  Stand to Sit: Supervision     Locomotion  Ambulation: Stand by Assist;Contact Guard Assist;with single point cane  Stair Management: Supervision;two rails;step to pattern  Number of Stairs: 4  Distance Walked (ft) (Step 6,7): 300 Feet.     There Ex:  Seated:  Hip flexion x 10 each  Hip abduction x 10 each  LAQ'x x 10 each  Heel/toe raises x 10 each       Treatment Activities: .  Pt educated in the importance of maintaining strength and endurance by getting oob and ambulating with nursing to minimize time in bed.  Instructed patient in safe technique with stair training utilizing a step to pattern with stepping up with stronger lower extremity and bringing the weaker lower extremity to the same step. Instructed patient in using two rails to facilitate proper technique. Instructed patient on descending stairs by stepping down with weaker lower extremity first and then bringing the stronger lower extremity down to the same step. Pt educated in modifications to home to reduce risk of falls such as removing throw rugs and clutter on floors and installing grab bars in bathroom. Patient mildly unsteady ambulating without AD. Recommended that patient try [facility] for increased safety with ambulation. Patient instructed in gait pattern using SPC. Patient reported feeling increased balance using spc.  Patient demonstrated proficiency ambulating with spc, without loss of balance. Pt was instructed in there ex per above with focus and facilitation on correct positioning and cadence to maximize quality of each exercise.  Pt instructed to perform exercises 2-3 times a day to increase generalized strength and endurance and to facilitate increased independence with mobility and ADL's.   Educated the patient to role of physical therapy, plan of care, goals of therapy and HEP, safety with mobility  and ADLs, home safety.    Patient left without needs and call bell within reach.  Alarm set.  RN notified of session outcome.

## 2017-09-20 NOTE — Plan of Care (Signed)
Problem: Safety  Goal: Patient will be free from injury during hospitalization  Outcome: Progressing  Patient fall risk; non skid socks on, bed exit alarm on, call bell within reach.  hourly rounding ongoing.   09/20/17 0411   Goal/Interventions addressed this shift   Patient will be free from injury during hospitalization  Assess patient's risk for falls and implement fall prevention plan of care per policy;Provide and maintain safe environment;Use appropriate transfer methods;Ensure appropriate safety devices are available at the bedside;Include patient/ family/ care giver in decisions related to safety;Hourly rounding;Assess for patients risk for elopement and implement Elopement Risk Plan per policy         Problem: Pain  Goal: Pain at adequate level as identified by patient  Outcome: Progressing  Patient denies any pain at this time, will continue to monitor.   09/16/17 2303   Goal/Interventions addressed this shift   Pain at adequate level as identified by patient Identify patient comfort function goal;Assess for risk of opioid induced respiratory depression, including snoring/sleep apnea. Alert healthcare team of risk factors identified.;Assess pain on admission, during daily assessment and/or before any "as needed" intervention(s);Reassess pain within 30-60 minutes of any procedure/intervention, per Pain Assessment, Intervention, Reassessment (AIR) Cycle;Evaluate patient's satisfaction with pain management progress;Evaluate if patient comfort function goal is met;Offer non-pharmacological pain management interventions       Problem: Psychosocial and Spiritual Needs  Goal: Demonstrates ability to cope with hospitalization/illness  Outcome: Progressing  Patient compliant and cooperative, and understands plan of care.   09/17/17 1844   Goal/Interventions addressed this shift   Demonstrates ability to cope with hospitalizations/illness Encourage verbalization of feelings/concerns/expectations;Provide quiet  environment;Assist patient to identify own strengths and abilities;Encourage patient to set small goals for self;Reinforce positive adaptation of new coping behaviors;Encourage participation in diversional activity       Problem: Compromised Tissue integrity  Goal: Damaged tissue is healing and protected  Outcome: Progressing   09/20/17 1534   Goal/Interventions addressed this shift   Damaged tissue is healing and protected  Monitor/assess Braden scale every shift;Provide wound care per wound care algorithm;Reposition patient every 2 hours and as needed unless able to reposition self;Increase activity as tolerated/progressive mobility;Keep intact skin clean and dry;Relieve pressure to bony prominences for patients at moderate and high risk;Avoid shearing injuries;Use bath wipes, not soap and water, for daily bathing       Problem: Heart Failure  Goal: Stable vital signs and fluid balance  Outcome: Progressing   09/20/17 0411   Goal/Interventions addressed this shift   Stable vital signs and fluid balance Monitor/assess vital signs and telemetry per unit protocol;Weigh on admission and record weight daily;Assess signs and symptoms associated with cardiac rhythm changes;Monitor intake/output per unit protocol and/or LIP order;Monitor lab values;Monitor for leg swelling/edema and report to LIP if abnormal     Goal: Mobility/Activity is maintained at optimal level for patient  Outcome: Progressing   09/20/17 0411   Goal/Interventions addressed this shift   Mobility/activity is maintained at optimal level for patient Increase mobility as tolerated/progressive mobility;Encourage independent activity per ability;Plan activities to conserve energy, plan rest periods;Assess for changes in respiratory status, level of consciousness and/or development of fatigue       Problem: Fluid and Electrolyte Imbalance/ Endocrine  Goal: Fluid and electrolyte balance are achieved/maintained  Outcome: Progressing   09/20/17 1534    Goal/Interventions addressed this shift   Fluid and electrolyte balance are achieved/maintained Monitor intake and output every shift;Monitor/assess lab values and report abnormal values;Provide adequate hydration;Assess  for confusion/personality changes;Monitor daily weight;Assess and reassess fluid and electrolyte status

## 2017-09-21 LAB — CBC AND DIFFERENTIAL
Absolute NRBC: 0 10*3/uL
Basophils Absolute Automated: 0.02 10*3/uL (ref 0.00–0.20)
Basophils Automated: 0.2 %
Eosinophils Absolute Automated: 0.22 10*3/uL (ref 0.00–0.70)
Eosinophils Automated: 2.3 %
Hematocrit: 42.2 % (ref 42.0–52.0)
Hgb: 13.9 g/dL (ref 13.0–17.0)
Immature Granulocytes Absolute: 0.03 10*3/uL
Immature Granulocytes: 0.3 %
Lymphocytes Absolute Automated: 5.46 10*3/uL — ABNORMAL HIGH (ref 0.50–4.40)
Lymphocytes Automated: 57.9 %
MCH: 31 pg (ref 28.0–32.0)
MCHC: 32.9 g/dL (ref 32.0–36.0)
MCV: 94.2 fL (ref 80.0–100.0)
MPV: 12 fL (ref 9.4–12.3)
Monocytes Absolute Automated: 0.7 10*3/uL (ref 0.00–1.20)
Monocytes: 7.4 %
Neutrophils Absolute: 3 10*3/uL (ref 1.80–8.10)
Neutrophils: 31.9 %
Nucleated RBC: 0 /100 WBC (ref 0.0–1.0)
Platelets: 146 10*3/uL (ref 140–400)
RBC: 4.48 10*6/uL — ABNORMAL LOW (ref 4.70–6.00)
RDW: 15 % (ref 12–15)
WBC: 9.43 10*3/uL (ref 3.50–10.80)

## 2017-09-21 LAB — CELL MORPHOLOGY
Cell Morphology: NORMAL
Platelet Estimate: NORMAL

## 2017-09-21 LAB — BASIC METABOLIC PANEL
Anion Gap: 11 (ref 5.0–15.0)
BUN: 18 mg/dL (ref 9.0–28.0)
CO2: 25 mEq/L (ref 22–29)
Calcium: 8.9 mg/dL (ref 7.9–10.2)
Chloride: 103 mEq/L (ref 100–111)
Creatinine: 1.1 mg/dL (ref 0.7–1.3)
Glucose: 126 mg/dL — ABNORMAL HIGH (ref 70–100)
Potassium: 3.8 mEq/L (ref 3.5–5.1)
Sodium: 139 mEq/L (ref 136–145)

## 2017-09-21 LAB — GLUCOSE WHOLE BLOOD - POCT
Whole Blood Glucose POCT: 83 mg/dL (ref 70–100)
Whole Blood Glucose POCT: 169 mg/dL — ABNORMAL HIGH (ref 70–100)

## 2017-09-21 LAB — GFR: EGFR: >60

## 2017-09-21 LAB — MAGNESIUM: Magnesium: 2 mg/dL (ref 1.6–2.6)

## 2017-09-21 MED ORDER — LISINOPRIL 10 MG PO TABS
10.00 mg | ORAL_TABLET | Freq: Every day | ORAL | 0 refills | Status: DC
Start: 2017-09-22 — End: 2017-09-21

## 2017-09-21 MED ORDER — DOCUSATE SODIUM 100 MG PO CAPS
100.00 mg | ORAL_CAPSULE | Freq: Two times a day (BID) | ORAL | Status: DC
Start: 2017-09-21 — End: 2017-09-21
  Administered 2017-09-21: 100 mg via ORAL
  Filled 2017-09-21: qty 1

## 2017-09-21 MED ORDER — POLYETHYLENE GLYCOL 3350 17 G PO PACK
17.00 g | PACK | Freq: Every day | ORAL | 0 refills | Status: DC | PRN
Start: 2017-09-21 — End: 2019-11-30

## 2017-09-21 MED ORDER — INSULIN GLARGINE 100 UNIT/ML SC SOLN
45.00 [IU] | Freq: Every evening | SUBCUTANEOUS | 0 refills | Status: DC
Start: 2017-09-21 — End: 2019-02-06

## 2017-09-21 MED ORDER — DSS 100 MG PO CAPS
100.00 mg | ORAL_CAPSULE | Freq: Two times a day (BID) | ORAL | Status: DC
Start: 2017-09-21 — End: 2020-04-18

## 2017-09-21 MED ORDER — FUROSEMIDE 40 MG PO TABS
80.00 mg | ORAL_TABLET | Freq: Two times a day (BID) | ORAL | 0 refills | Status: DC
Start: 2017-09-21 — End: 2017-11-14

## 2017-09-21 MED ORDER — MAGNESIUM HYDROXIDE 400 MG/5ML PO SUSP
30.00 mL | Freq: Once | ORAL | Status: DC
Start: 2017-09-21 — End: 2017-09-21

## 2017-09-21 MED ORDER — LISINOPRIL 10 MG PO TABS
10.00 mg | ORAL_TABLET | Freq: Every day | ORAL | Status: DC
Start: 2017-09-21 — End: 2017-09-21
  Administered 2017-09-21: 10 mg via ORAL
  Filled 2017-09-21: qty 1

## 2017-09-21 MED ORDER — LISINOPRIL 10 MG PO TABS
10.00 mg | ORAL_TABLET | Freq: Every day | ORAL | 0 refills | Status: DC
Start: 2017-09-22 — End: 2018-11-28

## 2017-09-21 MED ORDER — POLYETHYLENE GLYCOL 3350 17 G PO PACK
17.00 g | PACK | Freq: Every day | ORAL | Status: DC | PRN
Start: 2017-09-21 — End: 2017-09-21

## 2017-09-21 NOTE — Progress Notes (Signed)
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  ? Normal MPI September of 2017.    Status post St. Jude primary prevention ICD,  upgraded to a Medtronic BiV ICD device in January of 2018.   History of syncope in the past that has never been proven to represent ventricular arrhythmias by device interrogations. 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    Hypertension, well controlled.    Type 2 diabetes mellitus, on insulin.    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 Spent about 15 min discussing low sodium diet with pt. He had many questions about Na+ content in fast food meals. He tends to eat fast food a lot, specifically [employer] and [employer], also likes diet coke. He was advised to try to avoid fast food restaurants and sodas in general.   Outpatient f/u      Medications:      Scheduled Meds: PRN Meds:        amitriptyline 25 mg Oral QHS   atorvastatin 40 mg Oral QHS   carvedilol 6.25 mg Oral Q12H SCH   docusate sodium 100 mg Oral BID   fluticasone-salmeterol 1 puff Inhalation BID   furosemide 80 mg Oral BID   heparin (porcine) 5,000 Units Subcutaneous Q12H SCH   insulin glargine 45 Units Subcutaneous QHS   insulin lispro 5 Units Subcutaneous TID AC   niacin 500 mg Oral QHS   pantoprazole 40 mg Oral QAM AC   tamsulosin 0.4 mg Oral QD after dinner       Continuous Infusions:     acetaminophen 650 mg Q4H PRN   albuterol 2.5 mg BID PRN   bisacodyl 10 mg QD PRN   dextrose  15 g of glucose PRN   And     dextrose 12.5 g PRN   And     glucagon (rDNA) 1 mg PRN   furosemide 80 mg Once PRN   insulin lispro 1-3 Units QHS PRN   insulin lispro 1-5 Units TID AC PRN   polyethylene glycol 17 g QD PRN             Subjective:   Denies chest pain or palpitation. No sob      Physical Exam:     Blood pressure (!) 133/94, pulse 90, temperature 97.3 F (36.3 C), resp. rate 18, height 1.829 m (6'), weight 89 kg (196 lb 3.2 oz), SpO2 95 %.      Telemetry reviewed, V paced,  nsvt     Intake and Output Summary (Last 24 hours) at Date Time    Intake/Output Summary (Last 24 hours) at 09/21/17 1006  Last data filed at 09/21/17 0900   Gross per 24 hour   Intake             1948 ml   Output  2650 ml   Net             -702 ml       General Appearance:  Breathing comfortable, no acute distress  Neck: minimal jugular venous distension  Lungs:  Clear bilaterally, good respiratory effort, remains on oxygen   Heart:  S1, S2 normal, no S3, no S4, no murmur, PMI not displaced, no rub   Abdomen:  Soft, non-tender, positive bowel sounds, + hepatojugular reflux  Extremities:  No cyanosis.trace BLE edema      Labs:       Recent Labs  Lab 09/17/17  0810 09/16/17  2354 09/16/17  1429   Troponin I 0.10* 0.11* 0.11*               Recent Labs  Lab 09/18/17  0614   Bilirubin, Total 1.6*   Bilirubin, Direct 0.7*   Protein, Total 5.3*   Albumin 3.1*   ALT 82*   AST (SGOT) 35*       Recent Labs  Lab 09/21/17  0349   Magnesium 2.0       Recent Labs  Lab 09/17/17  0810   PT 17.0*   PT INR 1.4*   PTT 31       Recent Labs  Lab 09/21/17  0349 09/19/17  0439 09/18/17  0614   WBC 9.43 7.96 8.45   Hgb 13.9 13.6 12.8*   Hematocrit 42.2 41.7* 39.6*   Platelets 146 156 141       Recent Labs  Lab 09/21/17  0349 09/20/17  1047 09/19/17  0439   Sodium 139 139 140   Potassium 3.8 4.9 4.1   Chloride 103 101 102   CO2 25 24 26    BUN 18.0 17.7 20.3   Creatinine 1.1 1.3 1.3   EGFR >60.0 >60.0 >60.0   Glucose 126* 201* 224*   Calcium 8.9  9.1 8.6       .  Lab Results   Component Value Date    BNP 956.2 (H) 09/18/2017      Estimated Creatinine Clearance: 68.6 mL/min (based on SCr of 1.1 mg/dL).    Weight Monitoring 09/16/2017 09/16/2017 09/17/2017 09/18/2017 09/19/2017 09/20/2017 09/21/2017   Height 182.9 cm 182.9 cm - - - - -   Height Method Stated - - - - - -   Weight 104 kg 98.2 kg 98 kg 93.214 kg 92.625 kg 89.086 kg 88.996 kg   Weight Method Stated Bed Scale Bed Scale Standing Scale Standing Scale Standing Scale Standing Scale   BMI (calculated) 31.2 kg/m2 29.4 kg/m2 - - - - -         Imaging:     Echo Results                               Impression:           1.  The quality of the study is technically adequate for interpretation.  2.  The left ventricle is normal in size. Left ventricular systolic  function is normal. There are no regional wall motion abnormalities.  Estimated EF is 15%. There is no left ventricular hypertrophy.  3.  The left atrium is severely dilated.  4.  The aortic valve is trileaflet. Normal valve excursion is present.  There is no significant aortic insufficiency. No aortic stenosis is  present. The aortic root is normal in size.  5.  The mitral valve is  structurally normal. Moderate to severe mitral  insufficiency is present.  6.  The right ventricle is dilated with severely reduced function.   Pacer wire is noted.  7.  The right atrium is dilated.  8. The tricuspid valve is structurally normal. There is severe tricuspid  insufficiency.  9. The pulmonic valve is structurally normal. There is mild pulmonic  insufficiency.  10. 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  11. No pericardial effusion, intracardiac thrombi, or masses are seen.  12. The atrial septum is structurally normal. No shunt by color flow  doppler.    CONCLUSION:    1. Moderately dilated left ventricle with severely reduced systolic  function, LVEF 15%.  Prominent trabeculations are noted at the apex but  no definitive thrombus is noted.  2.  Severe left atrial enlargement.  Dilated right atrium.  3. Dilated right ventricle with severely reduced systolic function.  4. Severe tricuspid regurgitation.  Moderate to severe mitral  regurgitation.  5. Moderate pulmonary hypertension, estimated PASP .  6. Compared to the previous study in Feb 2018, the LVEF has dropped from  30% to 15% and the degree of valvular regurgitation has increased.      Exie Parody, MD   09/20/2017 3:51 PM            Signed by: Marian Sorrow, MD        [facility]  NP Spectralink [phone] (8am-5pm)  MD Spectralink [phone] (8am-5pm)  After hours, non urgent consult line [phone]  After Hours, urgent consults [phone]

## 2017-09-21 NOTE — Plan of Care (Signed)
Problem: Safety  Goal: Patient will be free from injury during hospitalization  Outcome: Progressing   09/21/17 0811   Goal/Interventions addressed this shift   Patient will be free from injury during hospitalization  Assess patient's risk for falls and implement fall prevention plan of care per policy;Provide and maintain safe environment;Use appropriate transfer methods;Ensure appropriate safety devices are available at the bedside;Include patient/ family/ care giver in decisions related to safety;Hourly rounding;Assess for patients risk for elopement and implement Elopement Risk Plan per policy;Provide alternative method of communication if needed (communication boards, writing)

## 2017-09-21 NOTE — Progress Notes (Signed)
At 0320 AM Patient has 28 bts v-tach.Patient Asymptomatic .VSS. Denies Pain Dr Ricka Burdock Notify. Check Electrolyte. Sat Drop to 84 on R/A. Patient refuse BIPAP @ Night.No New Order will Continues Monitor.

## 2017-09-21 NOTE — Discharge Instr - AVS First Page (Addendum)
Reason for your Hospital Admission:  Acute exacerbation of heart failure.       Instructions for after your discharge:  F/u with cardiology within 1 week.   F/U with PCP in the next couple of days.     Home Health Discharge Information     Your doctor has ordered Skilled Nursing, Physical Therapy and Occupational Therapy in-home service(s) for you while you recuperate at home, to assist you in the transition from hospital to home.      The agency that you or your representative chose to provide the service:  Name of Home Health Agency: [facility] ([phone])]    The above services were set up by:  Julien Girt  [facility] Liaison)   Phone      [phone]                                       Additional comments:   If you have not heard from your home health agency within 24-48 hours after discharge please call your agency to arrange a time for your first visit.  For any scheduling concerns or questions related to home health, such as time or date please contact your home health agency at the number listed above.

## 2017-09-21 NOTE — OT Eval Note (Signed)
[facility]  [address]  [place], [place]. [address]    Department of Rehabilitation Services  [phone]    Occupational Therapy Evaluation    Patient: [name] [name]    MRN#: [id number]     [id number]    Time of treatment: Time Calculation  OT Received On: 09/21/17  Start Time: 1427  Stop Time: 1450  Time Calculation (min): 23 min  OT Visit Number: 1    Consult received for [name] [name] for OT Evaluation and Treatment.  Patient's medical condition is appropriate for Occupational therapy intervention at this time.    Assessment:   [name] [name] is a 71 y.o. male admitted 09/16/2017.   Brief chart review completed including review of labs, review of imaging, review of vitals and review of H&P and physician progress notes.      Assessment: Appears to be at baseline for ADL's     Complexity Chart Review Performance Deficits Clinical Decision Making Hx/Comorbidities Assistance needed   Low Brief 1-3 Limited options None None (or at baseline)   Moderate Expanded 3-5 Several Options 1-2 Min/Mod assist (not at baseline)   High Extensive 5 or more Multiple options 3 or more Max/dependent (not at baseline     Therapy Diagnosis: None    Rehabilitation Potential: Prognosis: Good;With family      Plan:   OT Frequency Recommended: one time visit   Treatment Interventions: No skilled interventions needed at this time     Patient Goal  Patient Goal:  (to go home today)    Risks/Benefits/POC Discussed with Pt/Family: With patient    Goals: N/A due to no further skilled OT intervention warranted at this time.                                       Discharge Recommendations:   Based on today's session patient's discharge recommendation is the following: Discharge Recommendation: Home with supervision.    DME Recommended for Discharge: Shower chair;Grab bars (HH shower hose)        Precautions and Contraindications: Falls Risk          Medical Diagnosis: Cough [R05]  SOB (shortness of breath)  [R06.02]  Elevated troponin I level [R74.8]  Acute on chronic systolic CHF (congestive heart failure) [I50.23]    History of Present Illness: [name] [name] is a 71 y.o. male admitted on 09/16/2017 with " history of Multiple medical problems as mentioned above, presents with shortness of breath.   Patient was admitted from 08/22/17 through 08/30/17 for acute respiratory failure due to acute systolic congestive heart failure, AKI, treated with transient CRRT.  Patient improved with IV diuretics and sent to cardiac rehabilitation.  Patient informs he was doing well until one week ago when he started wheezing, says he is not able to lie flat and uses 2 pillows to sleep for past 4 days, for last 2 days, he is getting more short of breath, says he is uncomfortable even at rest.  Associated with substernal pressure-like chest pain off and on, improved with Tylenol.  Occasional palpitations and dizziness.  Informs his lower extremity edema is actually improving since discharge.  Denies any nausea, vomiting, abdominal pain.  Denies any fever or chills."--as per H & P note.          Patient Active Problem List   Diagnosis   . Chest pain   . Diabetes  mellitus   . Heart attack   . CAD (coronary artery disease)   . Non-ischemic cardiomyopathy   . Acute exacerbation of CHF (congestive heart failure)   . PUD (peptic ulcer disease)   . Elevated LFTs   . Acute dyspnea   . Elevated d-dimer   . CHF (congestive heart failure)   . Headaches due to old head injury   . Essential hypertension   . Pneumonia due to infectious organism, unspecified laterality, unspecified part of lung   . Syncope and collapse   . AICD (automatic cardioverter/defibrillator) present   . Syncope, unspecified syncope type   . Pneumonia due to infectious organism   . Dyspnea, unspecified type   . Cardiomyopathy   . Cough   . Sepsis   . AKI (acute kidney injury)   . Sepsis, due to unspecified organism   . SOB (shortness of breath)   . Acute on chronic  systolic CHF (congestive heart failure)        Past Medical/Surgical History:  Past Medical History:   Diagnosis Date   . AICD (automatic cardioverter/defibrillator) present 2014   . CAD (coronary artery disease)    . Cardiac defibrillator in situ    . CHF (congestive heart failure) 08/12/2014   . Diabetes mellitus    . Heart attack 2011    and 2014   . Hyperlipemia    . Hypertension    . Ischemic cardiomyopathy     20%   . Pneumonia 09/2016      Past Surgical History:   Procedure Laterality Date   . CARDIAC CATHETERIZATION     . CARDIAC DEFIBRILLATOR PLACEMENT     . CARDIAC PACEMAKER PLACEMENT     . CIRCUMCISION     . CORONARY STENT PLACEMENT  2011   . duodenal ulcer  1973   . EGD N/A 08/15/2014    Procedure: EGD;  Surgeon: Colon Branch, MD;  [facility];  Service: Gastroenterology;  Laterality: N/A;  egd w/ bx   . HERNIA REPAIR      [facility]   . orthopedic surgery      R foot corrective   . TONSILLECTOMY AND ADENOIDECTOMY  1954         X-Rays/Tests/Labs:  Impression:      Cardiomegaly with findings suggestive of pulmonary edema.    Shelly Flatten, MD   09/16/2017 3:10 PM      Social History:  Prior Level of Function  Prior level of function: Ambulates with assistive device, Needs assistance with ADLs (was independent prior to Oct 2018)  Assistive Device: Front wheel walker  Baseline Activity Level: Community ambulation (prior to Oct 2018)  Driving: independent  Cooking: light meal prep  DME Currently at Home: Other (Comment) (Pnt using DME at facility walker and w/c.  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 )  Home Living Arrangements  Living Arrangements: Spouse/significant other  Type of Home: House  Home Layout: Multi-level, Bed/bath upstairs  Bathroom Shower/Tub: Event organiser: Paediatric nurse  DME Currently at Home: Other (Comment) (Pnt using DME at facility walker and w/c.  Pnt states he has no DME at home except a work out pulley. )      Subjective:    Patient is agreeable to participation in the therapy session. Nursing clears patient for therapy.  Subjective:  (Patient denies pain currently)  Pain Assessment  Pain Assessment: No/denies pain.  Objective:   Observation of Patient/Vital Signs:  Patient is in bed with telemetry and peripheral IV in place.         Cognition/Neuro Status  Arousal/Alertness: Appropriate responses to stimuli  Attention Span: Appears intact  Orientation Level: Oriented X4  Memory: Appears intact  Following Commands: independent  Safety Awareness: independent  Insights: Fully aware of deficits  Behavior: attentive;calm;cooperative  Hand Dominance: right handed    Gross ROM  Right Upper Extremity ROM: within functional limits  Left Upper Extremity ROM: within functional limits  Gross Strength  Right Upper Extremity Strength: 4/5  Left Upper Extremity Strength: 4/5          Sensory  Auditory: intact  Tactile - Light Touch: intact (as per Patient report)       Self-care and Home Management  Grooming: Supervision;standing at sink;supervision/safety;wash/dry hands  LB Dressing: Supervision;Don/doff R sock;Don/doff L sock;Supervision/safety;sitting;edge of bed  Toileting: Supervision;grab bar use (simulating toileting tasks)    Mobility and Transfers  Supine to Sit: Supervision  Sit to Stand: Supervision (from  bed)  Bed to Toilet: Supervision (pushing IV pole)  Functional Mobility: Supervision pushing IV pole  -verbal/tactile instructions for proper hand placement and pacing with all functional transfers for increased safety.        Balance  Static Sitting Balance: good  Dyanamic Sitting Balance: good  Static Standing Balance: good  Dynamic Standing Balance: good    Participation and Endurance  Participation Effort: good  Endurance: Tolerates 10 - 20 min exercise with multiple rests  [place] Dyspnea Scale: 1+ Dyspnea (Patient's O2 sats 98% on RA)    AM-PACT "6 Clicks" Daily Activity Inpatient Short Form  Inpatient AM-PACT  Performed?: yes  Put On/Take Off Lower Body Clothing: A little  Assist with Bathing: A little  Assist with Toileting: A little  Put On/Take Off Upper Body Clothing: A little  Assist with Grooming: A little  Assist with Eating: A little  OT Daily Activity Raw Score: 18  CMS 0-100% Score: 46.65%        Treatment Activities: Patient instructed in proper pacing with transitional mvmts, encouraging Patient to wait atleast 60 secs. To notice how he feels (lightheaded or dizzy) before attempting to stand/walk for fall prevention. Patient verbalized understanding of same. Patient educated in LB dressing techniques with recommendations made to sit to initiate pants/underwear over feet, then to stand to pull pants up over hips for fall prevention/energy conservation. Discussed with Patient home safety recommendations for bathroom use. Recommendations made for shower seat with back, hand-held shower hose, non-skid bathmat, grab bars in shower and around toilet, raised toilet seat for energy conservation and fall prevention. Also advised Patient to have Supervision with shower transfers and showering upon d/c to ensure safety. Patient provided with purchasing information on the above mentioned DME and was receptive to same. Pt. Instructed in energy conservation techniques focusing on proper pacing, prioritizing daily routine and planning ahead with gradual return to daily routine. Patient in bed at end of session with all needs within reach. Patient instructed to ring for nursing for all needs and appeared receptive to all education provided. Chair Alarm activated for Patient's safety and RN notified of session outcome.        Educated the patient to role of occupational therapy, plan of care, goals of therapy and safety with mobility and ADLs, energy conservation techniques, home safety.    Tennis Ship. Trixie Deis, MS,OTR/L  Pager # [phone]  [phone]

## 2017-09-21 NOTE — Discharge Summary (Signed)
[name] HOSPITALIST   [place] Summary   Patient Info:   Date/Time: 09/21/2017 / 2:32 PM   Admit Date:09/16/2017  Patient Name:[name] [name]   ZOX:[id number]   PCP: Zorita Pang, MD  Attending Physician:Kayla Weekes, Michiel Sites, MD     Hospital Course:   Please see H&P for complete details of HPI and ROS. The patient was admitted to [facility] and has been taken care as mentioned below.    71 y/o male with past h/o severe ischemic cardiomyopathy CAD,CHF,DM,HTN, was sent in by [place] [facility] for worsening Shortness of breath evidence of increasing fluid overload.    1.  Acute on chronic systolic congestive heart failure: Patient was admitted started on aggressive IV diuresis, had Daily weights, strict I's and O's.  Been followed and managed by cardiology [facility] and nephrology Dr. Roswell Nickel.   Rpt ECHO this admission showed worsening Systolic function from 30 to 15 % with right atrial dilation, left ventricle dilation,right ventricle dilation.   He was hemodynamically stable on coreg,and lasix 80 BID. Discharged on same per cardiology recs.  Started and discharged on lisinopril 10 mg daily after being cleared by nephrology. . .  F/U with cardiology in the next 1 week. Cardiology to call and set up a heart failure follow up.     2. CAD:   Cardiac cath 2014 with nonobstructive coronary artery disease.  September 2017, with normal nuclear stress test.  Elevated trop with demand ischemia.  Lipitor, Coreg, niacin.    3. 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     4.  Chronic kidney disease: On CRRT recently. Was seen and evaluated and managed by Dr. Roswell Nickel as inpatient.  Noted ok for ACE given the Echo findings.   Need to F/U with Dr. Roswell Nickel in 2 weeks.    5.  Diabetes mellitus type 2:   On 45 lantus his sugars are borderline but will still Langlois him on 45 untis lantus keeping in mind patient may be less compliant with diet compared to hospital.     6.   Hypertension:  Blood pressure seems to be fairly controlled.  Continue Coreg, Lasix.    7.  History of sleep disorder breathing:   Need outpatient appt for bipap settings once cardiac meds adjusted and settled.     8.  History of asthma: Patient seems to be stable.  No wheezing.  no SOB, ambulating with no difficulty.     9.  Episode of nonsustained ventricular tachycardia; electrolytes are within normal limits.  Patient does have automatic implantable cardiac defibrillator in place.  Patient was not symptomatic when he had the episode on12/1/18 at 3:05 PM, lasted for 12 seconds./30 beats    10. Constipation: good results with miralax and colace.     Given patients poor prognosis with severe dilated cardiomyopathy, and patient's noncompliance, he is a high risk for readmission. Reinforced him again the importance of following the diet suggested by cardiology.      Disposition:home  Condition at Discharge and Prognosis: stable  Admission Date:09/16/2017  Discharge Date: 09/21/17  Type of Admission:Inpatient   Code Status: Full Code  Subjective at the time of discharge:   Denies any new C/O. Was eager to go home.he wanted to go today as opposed to tomorrow morning.        Chief Complaint:  Shortness of Breath and Congestive Heart Failure    Objective:     Vitals:    09/21/17 0800 09/21/17 0900 09/21/17  1150 09/21/17 1256   BP: (!) 133/94 110/85 125/84 119/84   Pulse: 90   99   Resp:  18  16   Temp:  97.7 F (36.5 C)  97.4 F (36.3 C)   TempSrc:  Oral  Oral   SpO2:       Weight:       Height:         Physical Exam:    Gen Exam: AAA oriented, in nad.  Heart S1 S2 present  Lungs CTA  Abd benign  Ext no edema no cyanosis no clubbing.   Clinical Presentation:   History of Presenting Illness: Please refer to HPI in the Detailed H&P  Discharge Medications:   Discharge Medications:      Discharge Medication List      Taking    amitriptyline 25 MG tablet  Commonly known as:  ELAVIL  amitriptyline 25 mg tablet  Take 1  tablet every day by oral route.     atorvastatin 40 MG tablet  Dose:  40 mg  Commonly known as:  LIPITOR  Take 1 tablet (40 mg total) by mouth nightly.     carvedilol 6.25 MG tablet  Dose:  6.25 mg  Commonly known as:  COREG  Take 1 tablet (6.25 mg total) by mouth every 12 (twelve) hours.     docusate sodium 100 MG capsule  Dose:  100 mg  Commonly known as:  COLACE  Take 1 capsule (100 mg total) by mouth 2 (two) times daily.     esomeprazole 40 MG capsule  Dose:  40 mg  Commonly known as:  NexIUM  Take 40 mg by mouth every morning before breakfast.     fluticasone-salmeterol 100-50 MCG/DOSE Aepb  Dose:  1 puff  Commonly known as:  ADVAIR DISKUS  Inhale 1 puff into the lungs 2 (two) times daily.     furosemide 40 MG tablet  Dose:  80 mg  What changed:   how much to take   when to take this  Commonly known as:  LASIX  Take 2 tablets (80 mg total) by mouth 2 (two) times daily.     glucose blood test strip  Dose:  1 each  Commonly known as:  ONE TOUCH ULTRA TEST  1 each by Other route 2 (two) times daily.Use as instructed     insulin glargine 100 UNIT/ML injection  Dose:  45 Units  What changed:  how much to take  Commonly known as:  LANTUS  Inject 45 Units into the skin nightly.     lisinopril 10 MG tablet  Dose:  10 mg  Commonly known as:  PRINIVIL,ZESTRIL  Start taking on:  09/22/2017  Take 1 tablet (10 mg total) by mouth daily.     niacin 500 MG CR tablet  Commonly known as:  NIASPAN  niacin ER 500 mg tablet,extended release 24 hr     polyethylene glycol packet  Dose:  17 g  Commonly known as:  MIRALAX  Take 17 g by mouth daily as needed (constipation).     SYMBICORT 80-4.5 MCG/ACT inhaler  Dose:  2 puff  Generic drug:  budesonide-formoterol  Inhale 2 puffs into the lungs 2 (two) times daily.     tamsulosin 0.4 MG Caps  Dose:  0.4 mg  Commonly known as:  FLOMAX  Take 1 capsule (0.4 mg total) by mouth Daily after dinner.          Follow up recommendations:   Follow  up:   Follow-up Information     Zorita Pang, MD  Follow up in 2 day(s).    Specialty:  Family Medicine  Contact information:  [phone]  [place] [address]  [phone]             [facility] Follow up in 1 week(s).    Contact information:  [address]  [address]  [place] [address]  [phone]                Results of Labs/imaging:   Labs have been reviewed:   Coagulation Profile:   Recent Labs  Lab 09/17/17  0810   PT 17.0*   PT INR 1.4*   PTT 31       CBC review:   Recent Labs  Lab 09/21/17  0349 09/19/17  0439 09/18/17  0614 09/17/17  0810 09/16/17  1429   WBC 9.43 7.96 8.45 8.14 11.21*   Hgb 13.9 13.6 12.8* 13.2 13.9   Hematocrit 42.2 41.7* 39.6* 39.5* 42.2   Platelets 146 156 141 143 161   MCV 94.2 93.9 93.0 91.0 92.7   RDW 15 16* 16* 16* 16*   Neutrophils 31.9 39.0  --   --   --    Segmented Neutrophils  --   --   --   --  33   Lymphocytes Automated 57.9 50.5  --   --   --    Eosinophils Automated 2.3 2.9  --   --   --    Immature Granulocyte 0.3 0.3  --   --   --    Neutrophils Absolute 3.00 3.11  --   --   --    Absolute Immature Granulocyte 0.03 0.02  --   --   --      Chem Review:  Recent Labs  Lab 09/21/17  0349 09/20/17  1047 09/19/17  0439 09/19/17  0438 09/18/17  0614 09/17/17  0810 09/16/17  2354 09/16/17  1430 09/16/17  1429   Sodium 139 139 140  --  139 141  --   --  139   Potassium 3.8 4.9 4.1  --  3.7 4.0 3.8  --  5.2*   Chloride 103 101 102  --  105 107  --   --  108   CO2 25 24 26   --  25 24  --   --  19*   BUN 18.0 17.7 20.3  --  22.2 20.2  --   --  23.9   Creatinine 1.1 1.3 1.3  --  1.4* 1.3  --   --  1.7*   Glucose 126* 201* 224*  --  158* 127*  --   --  200*   Calcium 8.9 9.1 8.6  --  8.4 8.9  --   --  9.2   Magnesium 2.0  --   --  1.9  --  2.2 1.5* 2.1  --    Phosphorus  --   --   --   --   --   --   --  4.3  --    Bilirubin, Total  --   --   --   --  1.6*  --   --   --  1.5*   AST (SGOT)  --   --   --   --  35*  --   --   --  73*   ALT  --   --   --   --  82*  --   --   --  116*   Alkaline  Phosphatase  --   --   --   --  148*  --   --   --  185*     Results     Procedure Component Value Units Date/Time    Glucose Whole Blood - POCT [[id number]]  (Abnormal) Collected:  09/21/17 1113     Updated:  09/21/17 1120     POCT - Glucose Whole blood 169 (H) mg/dL     Glucose Whole Blood - POCT [[id number]] Collected:  09/21/17 0721     Updated:  09/21/17 0800     POCT - Glucose Whole blood 83 mg/dL     GFR [[id number]] Collected:  09/21/17 0349     Updated:  09/21/17 0449     EGFR >60.0    Magnesium [[id number]] Collected:  09/21/17 0349    Specimen:  Blood Updated:  09/21/17 0449     Magnesium 2.0 mg/dL     Basic Metabolic Panel [[id number]]  (Abnormal) Collected:  09/21/17 0349    Specimen:  Blood Updated:  09/21/17 0449     Glucose 126 (H) mg/dL      BUN 63.8 mg/dL      Creatinine 1.1 mg/dL      Calcium 8.9 mg/dL      Sodium 756 mEq/L      Potassium 3.8 mEq/L      Chloride 103 mEq/L      CO2 25 mEq/L      Anion Gap 11.0    Cell MorpHology [[id number]] Collected:  09/21/17 0349     Updated:  09/21/17 0445     Cell Morphology: Normal     Platelet Estimate Normal    CBC and differential [[id number]]  (Abnormal) Collected:  09/21/17 0349    Specimen:  Blood from Blood Updated:  09/21/17 0445     WBC 9.43 x10 3/uL      Hgb 13.9 g/dL      Hematocrit 60.1 %      Platelets 146 x10 3/uL      RBC 4.48 (L) x10 6/uL      MCV 94.2 fL      MCH 31.0 pg      MCHC 32.9 g/dL      RDW 15 %      MPV 12.0 fL      Neutrophils 31.9 %      Lymphocytes Automated 57.9 %      Monocytes 7.4 %      Eosinophils Automated 2.3 %      Basophils Automated 0.2 %      Immature Granulocyte 0.3 %      Nucleated RBC 0.0 /100 WBC      Neutrophils Absolute 3.00 x10 3/uL      Abs Lymph Automated 5.46 (H) x10 3/uL      Abs Mono Automated 0.70 x10 3/uL      Abs Eos Automated 0.22 x10 3/uL      Absolute Baso Automated 0.02 x10 3/uL      Absolute Immature Granulocyte 0.03 x10 3/uL      Absolute NRBC 0.00 x10 3/uL     Glucose Whole Blood - POCT [[id number]]   (Abnormal) Collected:  09/20/17 2032     Updated:  09/20/17 2038     POCT - Glucose Whole blood 238 (H) mg/dL     Glucose Whole Blood - POCT [[id number]]  (Abnormal) Collected:  09/20/17 1637  Updated:  09/20/17 1703     POCT - Glucose Whole blood 237 (H) mg/dL         Radiology reports have been reviewed:  Radiology Results (24 Hour)     ** No results found for the last 24 hours. **        Echocardiogram Adult Complete W Clr/ Dopp Waveform    Result Date: 09/20/2017  ECHOCARDIOGRAM Sonographer:  Steele Berg Indications:  Acute on chronic systolic heart failure Height (in):  72 Weight (lb):  196 Blood Pressure:  115/81   2-D Measurements Left Ventricle                                          Diastolic Dimension:  66  (40-56 mm) Systolic Dimension:  63  (25-40 mm)     Septal Diastolic Thickness:  10  (6-11 mm)    Posterior Wall Thickness:  7  (6-11 mm) Fractional Shortening Percentage:  5%  (24-46 %)                       Visually Estimated Ejection Fraction:  15%   (55-75 %)                        Right Ventricle Diastolic Dimension:  37  (7-26 mm)                           Left Atrium End Systolic Dimension:  48  (19-40 mm)                    Aortic Root:  36  (20-37 mm)                       Doppler Measurements and Color Flow Imaging Valves                                        Aortic Valve:  1.0  (0.9-1.8 m/s).         Regurgitation:  None Pulmonic Valve:  0.8  (0.6-0.9 m/s).     Regurgitation:  Mild Mitral Valve:  0.5  (0.6-1.4 m/s).          Regurgitation:  Moderate to severe Tricuspid Valve:  0.5  (0.4-0.8 m/s.     Regurgitation:  Severe Left Ventricular Outflow Tract Velocity:  0.6 m/s. E/A Ratio:  1.9 Est. PASP:  50 Est. RA Mean Pressure:  8 LA Volume 114 (18-58 ml) LA Volume Index 54 (16-28 ml/m2) TAPSE 7 (> or =16 mm) Echocardiogram M-mode, 2D, spectral Doppler and color flow imaging were performed and interpreted.     1.  The quality of the study is technically adequate for interpretation. 2.  The  left ventricle is normal in size. Left ventricular systolic function is normal. There are no regional wall motion abnormalities. Estimated EF is 15%. There is no left ventricular hypertrophy. 3.  The left atrium is severely dilated. 4.  The aortic valve is trileaflet. Normal valve excursion is present. There is no significant aortic insufficiency. No aortic stenosis is present. The aortic root is normal in size. 5.  The mitral valve is structurally normal. 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 6.  The right ventricle is dilated with severely reduced function. Pacer wire is noted. 7.  The right atrium is dilated. 8. The tricuspid valve is structurally normal. There is severe tricuspid insufficiency. 9. The pulmonic valve is structurally normal. There is mild pulmonic insufficiency. 10. 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 11. No pericardial effusion, intracardiac thrombi, or masses are seen. 12. The atrial septum is structurally normal. No shunt by color flow doppler. CONCLUSION: 1. Moderately dilated left ventricle with severely reduced systolic function, LVEF 15%.  Prominent trabeculations are noted at the apex but no definitive thrombus is noted. 2. Severe left atrial enlargement.  Dilated right atrium. 3. Dilated right ventricle with severely reduced systolic function. 4. Severe tricuspid regurgitation.  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 5. Moderate pulmonary hypertension, estimated PASP . 6. Compared to the previous study in Feb 2018, the LVEF has dropped from 30% to 15% and the degree of valvular regurgitation has increased. Exie Parody, MD 09/20/2017 3:51 PM    Xr Chest  Ap Portable    Result Date: 09/16/2017  HISTORY: Shortness of breath COMPARISON: 08/28/2017 FINDINGS: Cardiomegaly with triple lead left-sided pacer device in stable position. New pulmonary edema. No pneumothorax. Small effusions not excluded.      Cardiomegaly with findings suggestive of pulmonary  edema. Shelly Flatten, MD 09/16/2017 3:10 PM    Xr Chest Ap Portable    Result Date: 08/28/2017  HISTORY: Pneumonia. COMPARISON: Examination 4 days ago. FINDINGS: Single portable view of the chest was obtained. Perihilar and basilar predominant airspace opacities have resolved. The lungs are clear. The heart remains enlarged with an AICD in place. Right internal jugular central venous catheters are unchanged. There is no pneumothorax.      Clear lungs. Elizebeth Koller, MD 08/28/2017 11:20 AM    Xr Chest Ap Portable    Result Date: 08/24/2017  Indication: Verify placement of triple lumen catheter and temporary dialysis catheter. Procedure: Portable AP view of the chest. Comparison: Prior exam earlier on November 6. Findings: New right internal jugular central lines with tips in the region of the caval atrial junction. No visible pneumothorax. Left-sided defibrillator generator with leads extending into the right atrium, right ventricle, and coronary sinus. Moderate cardiomegaly. Unchanged predominant perihilar and inferior lung opacities, nonspecific but likely due to pulmonary edema and atelectasis. Small effusions may be present layering posteriorly because of supine positioning.      New right-sided central lines. Wynema Birch, MD 08/24/2017 12:58 PM    Xr Chest Ap Portable    Result Date: 08/24/2017  Indication: CHF. Acute kidney injury. Procedure: Portable AP view of the chest. Comparison: Prior exam October 5. Findings: Left-sided defibrillator generator with leads extending into the right atrium, right ventricle, and coronary sinus. Moderate cardiomegaly. Small basilar opacities are likely to be foci of atelectasis. Small pleural effusions may be present, layering posteriorly because of supine positioning. Mild evidence of interstitial edema, without significant change. No pneumothorax.      Congestive heart failure. Small pleural effusions are suspected layering posteriorly because of supine  positioning. Wynema Birch, MD 08/24/2017 10:16 AM    Xr Chest Ap Portable    Result Date: 08/23/2017  HISTORY: Congestive heart failure. COMPARISON: 08/22/2017 radiographs and CT of the chest FINDINGS:  AP upright radiograph of the chest. There is slight increased hazy opacity in the left lower lung zone, suggestive of increased left posterior layering pleural effusion. Mild right lung base atelectasis also noted. No frank  infiltrate or significant interstitial edema. No pneumothorax. Small lung volumes. Unchanged cardiomegaly. No pneumothorax. Cardiac pacer/defibrillator with 3 intravenous leads, intact.     1.  Slightly increased hazy opacity in the left lower lung zone, suggestive of increased left posterior layering pleural effusion. 2.  Mild right lung base atelectasis. 3.  Small lung volumes. No frank infiltrate or significant interstitial edema. 4.  Unchanged cardiomegaly. Candi Leash, MD 08/23/2017 8:27 PM    Non-tunneled Cath Placement (tdc)    Result Date: 08/24/2017  HISTORY: Acute renal failure, requiring access for hemodialysis. Temporary hemodialysis access catheter placement performed under ultrasound guidance:: PROCEDURE:  The nature of the procedure, risks, benefits, and alternatives were discussed with the patient. All questions were answered and consent was obtained. Ultrasound was performed to assess the jugular veins prior to catheter placement. The right internal jugular vein is normally compressible .    The procedure was performed using standard sterile technique. The intended puncture site was infiltrated with local anesthetic injected subcutaneously.  Puncture of the vein was performed at bedside under direct sonographic guidance with a 21 gauge single wall needle. Sonographic images were recorded pre- and post- puncture for documentation.  A 0.018 inch guidewire was advanced into the vessel. The needle was removed and a 5 French coaxial dilator system was placed. The outer dilator was  exchanged over a 3 mm J-wire for series of fascial dilators to accommodate a temporary dialysis catheter. The catheter was then placed.  Excellent blood return and fluid infusion was confirmed. The lumina were flushed and capped. The catheter was secured with 0 Prolene suture material and a sterile dressing was applied.       Successful placement of a 16 cm temporary hemodialysis access catheter via right internal jugular venous approach under ultrasound guidance.  A chest x-ray will be obtained to assess tip position.   Lemar Livings, MD 08/24/2017 4:11 PM    Triple Lumen Cath (tlc)    Result Date: 08/24/2017  HISTORY: Urgent need for central venous access.  Requires intravenous access for fluids. Triple-lumen central venous catheter placement performed under ultrasound guidance:: PROCEDURE:  The nature of the procedure, risks, benefits, and alternatives were discussed with the patient.  All questions were answered and consent was obtained. Ultrasound was performed to assess the jugular veins prior to catheter placement. The right internal jugular vein is normally compressible .    The procedure was performed using standard sterile technique. The intended puncture site was infiltrated with local anesthetic injected subcutaneously.  Puncture of the vein was performed at bedside under direct sonographic guidance with a 21 gauge single wall needle. Sonographic images were obtained pre- and post- puncture for documentation.  A 0.018 inch guidewire was advanced into the vessel. The needle was removed and a 5 French coaxial dilator system was placed. The outer dilator was exchanged over a 3 mm J-wire for a 7 French dilator to accommodate a triple lumen central venous access catheter. The catheter was then placed.  Excellent blood return and fluid infusion was confirmed.  The lumina were flushed and capped. The catheter was secured with two 0-silk  sutures and a sterile dressing was applied.      Successful placement of a 7  [place] triple lumen central venous access catheter via right internal jugular  approach under ultrasound guidance.  A chest x-ray will be obtained to assess tip position.  Lemar Livings, MD 08/24/2017 4:12 PM    Pathology:   Specimens  None        Pending Lab Results:   Labs/Images to be followed at your PCP office: Unresulted Labs     Procedure . . . Date/Time    APTT [[id number]] Collected:  09/17/17 0810     Updated:  09/17/17 0824    Narrative:       Victorino Dike and Dahlia Client attempted then patient refused.  09/17/2017  07:04  Victorino Dike and Dahlia Client attempted then patient refused.  09/17/2017  07:04        Hospitalist:   Signed by: Brynda Rim  09/21/2017 2:32 PM  Time spent for discharge: 40 minutes      *This note was generated by the Epic EMR system/ Dragon speech recognition and may contain inherent errors or omissions not intended by the user. Grammatical errors, random word insertions, deletions, pronoun errors and incomplete sentences are occasional consequences of this technology due to software limitations. Not all errors are caught or corrected. If there are questions or concerns about the content of this note or information contained within the body of this dictation they should be addressed directly with the author for clarification

## 2017-09-21 NOTE — Progress Notes (Signed)
IV removed, Hemostasis achieved. Patient and Wife read all discharge instructions. Patient signed AVS in agreement. Patient wheeled downstairs by PCT.

## 2017-09-21 NOTE — Progress Notes (Signed)
Chaplaincy Services volunteer provided spiritual care through visitation.  Chaplain available for additional support if needed/desired.    Alcie Runions, BCC, MDiv, DMin   Chaplaincy Manager  [facility]  Chaplaincy Services  [phone]  **Page Chaplain 24/7 at [phone]**

## 2017-09-21 NOTE — Progress Notes (Signed)
Home Health Referral          Referral from Gar Ponto (Case Manager) for home health care upon discharge.    By [employer], the patient has the right to freely choose a home care provider.  Arrangements have been made with:     A company of the patients choosing. We have supplied the patient with a listing of providers in your area who asked to be included and participate in Medicare.   The preferred provider of your insurance company. Choosing a home care provider other than your insurance company's preferred provider may affect your insurance coverage.    Home Health Discharge Information     Your doctor has ordered Skilled Nursing, Physical Therapy and Occupational Therapy in-home service(s) for you while you recuperate at home, to assist you in the transition from hospital to home.      The agency that you or your representative chose to provide the service:  Name of Home Health Agency: [facility] [phone]    The above services were set up by:  Julien Girt  [facility] Liaison)   Phone      [phone]                                       Additional comments:   If you have not heard from your home health agency within 24-48 hours after discharge please call your agency to arrange a time for your first visit.  For any scheduling concerns or questions related to home health, such as time or date please contact your home health agency at the number listed above.   Home Health face-to-face (FTF) Encounter (Order [id number])   Consult   Date: 09/21/2017 Department: [name] Ordering/Authorizing: Brynda Rim, MD   Order Information     Order Date/Time Release Date/Time Start Date/Time End Date/Time   09/21/17 03:04 PM None 09/21/17 03:02 PM 09/21/17 03:02 PM   Order Details     Frequency Duration Priority Order Class   Once 1 occurrence Routine Hospital Performed   Standing Order Information     Remaining Occurrences Interval Last Released      0/1  Once 09/21/2017            Provider Information     Ordering User Ordering Provider Authorizing Provider   Uy-Le, Berton Mount, RN Brynda Rim, MD Brynda Rim, MD   Attending Provider(s) Admitting Provider PCP   Helayne Seminole, MD; Deloris Ping, MD; Brynda Rim, MD; Camillo Flaming, MD Deloris Ping, MD Zorita Pang, MD   Verbal Order Info     Action Created on Order Mode Entered by Responsible Provider Signed by Signed on   Ordering 09/21/17 1504 Telephone with readback Uy-Le, Berton Mount, RN Brynda Rim, MD     Order Questions     Question Answer Comment   Date of face-to-face (FTF) encounter: 09/21/2017    Medical conditions that necessitate Home Health care: B. Functional impairment due to recent hospitalization/procedure/treatment     C. Risk for complication/infection/pain requiring follow up and monitoring     D. Chronic illness & risk for re-hospitalization due to unstable disease status     E. Exacerbation of disease requiring follow up monitoring     F. New diagnosis & treatment requiring follow up monitoring and management  H. Multiple new medications requiring management and monitoring    Clinical findings that support the need for Skilled Nursing. SN will: C. Monitor for signs and symptoms of exacerbation of disease and management     D. Review medication reconciliation, manage and educate on use and side effects     H. Assess cardiopulmonary status and monitor for signs &symptoms of exacerbation     G. Educate on new diagnosis, treatment & management to prevent re-hospitalization    Clinical findings that support the need for Physical Therapy. PT will A. Evaluate and treat functional impairment and improve mobility     C. Educate on weight bearing status, stair/gait training, balance & coordination     D. Provide services to help restore function, mobility, and releive pain     E. Educate on functional mobility; bed, chair, sit, stand and transfer  activities     F. Perform home safety assessment & develop safe in home exercise program    Clinical findings support the need for OT (needs SN/PT order).OT will A. Develop in home program to improve ability to perform ADLs     B. Develop restorative program to improve mobility and independence     C. Educate on recovery and maintenance skills     D. Instruct on strategies to compensate for loss of function    Clinical findings that support the need for SLP. ST will F. N/A    Per clinical findings, following services are medically necessary: PT     OT     Skilled Nursing    Evidence this patient is homebound because: N. Impaired mobility d/t pain, arthritis, weakness that compromises patient safety     B. Profound weakness, poor balance/unsteady gait d/t illness/treatment/procedure     C. Decreased endurance, strength, ROM, cadence, safety/judgment during mobility     F. Deconditioned due to advance disease process requiring assistance to leave home    Other (please specify) PCP Dr Zorita Pang [phone]          Process Instructions     Please select Home Care Services medically necessary.     Based on the above findings, I certify that this patient is confined to the home and needs intermittent skilled nursing care, physical therapry and / or speech therapy or continues to need occupational therapy. The patient is under my care, and I have initiated the establishment of the plan of care. This patient will be followed by a physician who will periodically review the plan of care.         Collection Information     Consult Order Info     ID Description Priority Start Date Start Time   [id number] Home Health face-to-face (FTF) Encounter Routine 09/21/2017 3:02 PM   Provider Specialty Referred to   ______________________________________ _____________________________________   Acknowledgement Info     For At Acknowledged By Acknowledged On   Placing Order 09/21/17 1504 Uy-Le, Berton Mount, RN 09/21/17 1504    Verbal Order Info     Action Created on Order Mode Entered by Responsible Provider Signed by Signed on   Ordering 09/21/17 1504 Telephone with readback Uy-Le, Berton Mount, RN Brynda Rim, MD     Patient Information     Patient Name  Brogen, Duell Sex  Male DOB  03-18-1946   Additional Information     Associated Reports External References   Priority and Order Details [employer]       [facility] REFERRAL  Patient's Name: LUKUS, BINION [facility] Date: 12/5   DOB: 09-Jul-1946 From: Ermalinda Barrios RN BSN  [facility] Liaison  [phone]   MRN: [id number] For Physician: Zorita Pang, MD   Attending Physician: Brynda Rim, MD For (PT, OT, SLP, etc.): SN PT OT     Isolation Precautions:   No active isolations      Diagnoses:     Patient Active Problem List    Diagnosis Date Noted   . HAcute on chronic systolic CHF (congestive heart failure) [I50.23]    . HSOB (shortness of breath) [R06.02] 09/16/2017   . Sepsis [A41.9] 08/22/2017   . HAKI (acute kidney injury) [N17.9] 08/22/2017   . Sepsis, due to unspecified organism [A41.9] 08/22/2017   . Cough [R05] 12/02/2016   . HCardiomyopathy [I42.9] 11/10/2016   . Dyspnea, unspecified type [R06.00] 10/01/2016   . Pneumonia due to infectious organism [J18.9] 09/28/2016   . Syncope and collapse [R55] 02/20/2016   . AICD (automatic cardioverter/defibrillator) present [Z95.810] 02/20/2016   . Syncope, unspecified syncope type [R55] 02/20/2016   . Pneumonia due to infectious organism, unspecified laterality, unspecified part of lung [J18.9] 02/19/2016   . 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 HEssential hypertension [I10] 08/07/2015   . CHF (congestive heart failure) [I50.9] 08/13/2014   . HNon-ischemic cardiomyopathy [I42.8] 08/12/2014     Chronic   . HAcute exacerbation of CHF (congestive heart failure) [I50.9] 08/12/2014   . PUD (peptic ulcer disease) [K27.9] 08/12/2014     Chronic   . HElevated LFTs  [R94.5] 08/12/2014     Chronic   . HAcute dyspnea [R06.00] 08/12/2014   . Elevated d-dimer [R79.89] 08/12/2014   . Diabetes mellitus [E11.9]      Chronic   . Heart attack [I21.9]    . CAD (coronary artery disease) [I25.10]      Chronic   . Chest pain [R07.9] 12/29/2013       Admission Date:   09/16/2017    Discharge Date:   12/4    Recent Surgery, Date of Surgery, and Provider Performing:       * Surgery not found *    Allergies:   No Known Allergies        Height and Weight:     Ht Readings from Last 1 Encounters:   09/16/17 1.829 m (6')     Wt Readings from Last 1 Encounters:   09/21/17 89 kg (196 lb 3.2 oz)       Immunizations:     Most Recent Immunizations   Administered Date(s) Administered   . INFLUENZA HIGH DOSE 63YRS+ 07/28/2017   . Pneumococcal 23 valent 12/29/2013   . Zoster 08/07/2015       Past Medical History:        Past Medical History:   Diagnosis Date   . AICD (automatic cardioverter/defibrillator) present 2014   . CAD (coronary artery disease)    . Cardiac defibrillator in situ    . CHF (congestive heart failure) 08/12/2014   . Diabetes mellitus    . Heart attack 2011    and 2014   . Hyperlipemia    . Hypertension    . Ischemic cardiomyopathy     20%   . Pneumonia 09/2016       Past Surgical History:     Past Surgical History:   Procedure Laterality Date   . CARDIAC CATHETERIZATION     . CARDIAC DEFIBRILLATOR PLACEMENT     .  CARDIAC PACEMAKER PLACEMENT     . CIRCUMCISION     . CORONARY STENT PLACEMENT  2011   . duodenal ulcer  1973   . EGD N/A 08/15/2014    Procedure: EGD;  Surgeon: Colon Branch, MD;  [facility];  Service: Gastroenterology;  Laterality: N/A;  egd w/ bx   . HERNIA REPAIR      [facility]   . orthopedic surgery      R foot corrective   . TONSILLECTOMY AND ADENOIDECTOMY  1954       Social History:     Social History     Social History   . Marital status: Married     Spouse name: Lajoyce Corners   . Number of children: 0   . Years of education: N/A     Occupational History   .  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 Smoking status: Never Smoker   . Smokeless tobacco: Never Used   . Alcohol use No   . Drug use: No   . Sexual activity: Yes     Partners: Female     Other Topics Concern   . Not on file     Social History Narrative   . No narrative on file     Skilled Nursing:  Assessment:      Acute on chronic decompensated systolic CHF   Flat troponin secondary to demand ischemia, not ACS   CKD, most recent admission pt recieved CRRT therapy. 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  ? Normal MPISeptember of 2017.    Status post St. 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   History of syncope in the past that has never been proven to represent ventricular arrhythmias by device interrogations. 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    Hypertension, well controlled.    Type 2 diabetes mellitus, on insulin.    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  and treat    SPEECH THERAPY (ST):   Eval. and treat    Additional Notes:     Patient Information            Home Address: [address] [place] [address] Employer:  Employer Address: [employer]    ,     Main Phone: [phone] Employer Phone:    SSN: [id number]     DOB: [birth year] (71 yrs)     Sex: Male Primary Care Physician: Zorita Pang, MD   Marital Status: Married Primary Care Physician Phone: [phone]   Race: Black or African American Referring Physician: No ref. provider found   Ethnicity: Non Hispanic/Latino Religion: [place]   Emergency Contacts  Name Home Phone Work Phone Mobile Phone Relationship Lgl KHALEEF, RUBY   [phone] Octavio Manns    [phone] Mother in l*         Guarantor Information    Guarantor Name: LUPE, BONNER Guarantor ID: [id number]   Guarantor Relationship to Pt: Self Guarantor Type: Personal/Family   Guarantor DOB:   Sep 17, 1946     Guarantor Address: [phone] [address] [place]  [place], [place] [address]       Guarantor Home Phone: [phone] Guarantor Employer:     [employer]   Guarantor Work Phone:  Pensions consultant Emp Phone:               [employer] Name: [employer] POS Subscriber Name: [employer]   Insurance Address:   [address]  [place], [place] [address] Subscriber DOB: 1945-12-21     Subscriber ID: [id number]   Insurance Phone:  Pt Relationship to Sub:   Self   Insurance ID:      Group Name:  Preauthorization #: [id number]   Group #: [id number] Preauthorization Days:          Signed by: Julien Girt, RN  Date/Time: 09/21/2017, 3:06 PM

## 2017-09-21 NOTE — Plan of Care (Signed)
Problem: Safety  Goal: Patient will be free from injury during hospitalization  Outcome: Progressing   09/21/17 0112   Goal/Interventions addressed this shift   Patient will be free from injury during hospitalization  Assess patient's risk for falls and implement fall prevention plan of care per policy;Provide and maintain safe environment;Use appropriate transfer methods;Ensure appropriate safety devices are available at the bedside;Include patient/ family/ care giver in decisions related to safety;Hourly rounding       Problem: Pain  Goal: Pain at adequate level as identified by patient  Outcome: Progressing   09/21/17 0112   Goal/Interventions addressed this shift   Pain at adequate level as identified by patient Identify patient comfort function goal;Assess for risk of opioid induced respiratory depression, including snoring/sleep apnea. Alert healthcare team of risk factors identified.;Assess pain on admission, during daily assessment and/or before any "as needed" intervention(s);Reassess pain within 30-60 minutes of any procedure/intervention, per Pain Assessment, Intervention, Reassessment (AIR) Cycle;Evaluate patient's satisfaction with pain management progress;Evaluate if patient comfort function goal is met;Offer non-pharmacological pain management interventions       Problem: Heart Failure  Goal: Stable vital signs and fluid balance  Outcome: Progressing   09/21/17 0112   Goal/Interventions addressed this shift   Stable vital signs and fluid balance Monitor/assess vital signs and telemetry per unit protocol;Weigh on admission and record weight daily;Assess signs and symptoms associated with cardiac rhythm changes;Monitor intake/output per unit protocol and/or LIP order;Monitor lab values;Monitor for leg swelling/edema and report to LIP if abnormal

## 2017-09-21 NOTE — Progress Notes (Signed)
09/21/17 1528   Discharge Disposition   Patient preference/choice provided? Yes   Physical Discharge Disposition Home, Home Health   Name of Home Health Agency [facility]   Mode of Transportation Car  (wife transport to home )   Patient/Family/POA notified of transfer plan Patient informed only   Patient agreeable to discharge plan/expected d/c date? Yes   Bedside nurse notified of transport plan? Yes   CM Interventions   Follow up appointment scheduled? No   Reason no follow up scheduled? Patient declined   Referral made for home health RN visit? Yes     Edwin Cap, RN, BSN  Case Manager- [facility]  [phone]

## 2017-09-24 ENCOUNTER — Other Ambulatory Visit (INDEPENDENT_AMBULATORY_CARE_PROVIDER_SITE_OTHER): Payer: Self-pay | Admitting: Family Medicine

## 2017-09-24 DIAGNOSIS — J069 Acute upper respiratory infection, unspecified: Secondary | ICD-10-CM

## 2017-09-25 NOTE — Telephone Encounter (Signed)
Please check with patient . He was supposed to f/u with cardiology after his discharge from the hospital to discuss changes in his heart medication . I do not refill antibiotics . Patient has to make a f/u appointment .

## 2017-09-27 NOTE — Telephone Encounter (Signed)
Patient was made aware that he should follow up with his cardiologist for his heart medications. Patient was transferred to scheduling for antibiotic refill.

## 2017-09-28 ENCOUNTER — Encounter (INDEPENDENT_AMBULATORY_CARE_PROVIDER_SITE_OTHER): Payer: Self-pay | Admitting: Family Medicine

## 2017-09-29 ENCOUNTER — Telehealth (INDEPENDENT_AMBULATORY_CARE_PROVIDER_SITE_OTHER): Payer: Self-pay | Admitting: Family Medicine

## 2017-09-29 ENCOUNTER — Other Ambulatory Visit (INDEPENDENT_AMBULATORY_CARE_PROVIDER_SITE_OTHER): Payer: Self-pay | Admitting: Family Medicine

## 2017-09-29 ENCOUNTER — Ambulatory Visit (INDEPENDENT_AMBULATORY_CARE_PROVIDER_SITE_OTHER): Payer: Self-pay | Admitting: Physician Assistant

## 2017-09-29 LAB — VAHRT HISTORIC LVEF: Ejection Fraction: 15 %

## 2017-09-29 NOTE — Telephone Encounter (Signed)
Please check with patient the dose . In his discharge from the hospital it says 16.25 mg

## 2017-09-29 NOTE — Telephone Encounter (Signed)
Patient is asymptomatic . Talked to his nurse and all other vital signs are normal .   recommended to f/u with cardiologist ASAP . Patient has Pacemaker and should not have a low HR.

## 2017-09-29 NOTE — Telephone Encounter (Signed)
Nurse Amie Critchley from [facility] is calling to report that pt's bp today is 76/46 and heart rate is 53.She says that his heart skipped beats when she heard it.Please call her asap at [phone] with suggestion.  Thank you

## 2017-10-01 ENCOUNTER — Ambulatory Visit (INDEPENDENT_AMBULATORY_CARE_PROVIDER_SITE_OTHER): Payer: Commercial Managed Care - POS | Admitting: Family Medicine

## 2017-10-01 ENCOUNTER — Encounter (INDEPENDENT_AMBULATORY_CARE_PROVIDER_SITE_OTHER): Payer: Self-pay | Admitting: Family Medicine

## 2017-10-01 VITALS — BP 100/63 | HR 89 | Temp 97.3°F | Wt 199.2 lb

## 2017-10-01 DIAGNOSIS — I10 Essential (primary) hypertension: Secondary | ICD-10-CM

## 2017-10-01 DIAGNOSIS — I25700 Atherosclerosis of coronary artery bypass graft(s), unspecified, with unstable angina pectoris: Secondary | ICD-10-CM

## 2017-10-01 MED ORDER — NIACIN ER (ANTIHYPERLIPIDEMIC) 500 MG PO TBCR
EXTENDED_RELEASE_TABLET | ORAL | 3 refills | Status: DC
Start: 2017-10-01 — End: 2017-10-05

## 2017-10-01 MED ORDER — CARVEDILOL 25 MG PO TABS
25.00 mg | ORAL_TABLET | Freq: Two times a day (BID) | ORAL | 3 refills | Status: DC
Start: 2017-10-01 — End: 2018-10-23

## 2017-10-01 NOTE — Progress Notes (Signed)
Subjective:      Date: 10/01/2017 3:58 PM   Patient ID: [name] [name] is a 71 y.o. male.    Chief Complaint:  Chief Complaint   Patient presents with   . Shortness of Breath     ED follow up on 09/16/2017 for acute chronic systolic heart failure.    . Medication Refill     carvedilol        HPI:  HPI    Patient feels good . Was in hospital for 2 weeks because of SOB and CHF. Swelling in legs is gone now .      Problem List:  Patient Active Problem List   Diagnosis   . Chest pain   . Diabetes mellitus   . Heart attack   . CAD (coronary artery disease)   . Non-ischemic cardiomyopathy   . Acute exacerbation of CHF (congestive heart failure)   . PUD (peptic ulcer disease)   . Elevated LFTs   . Acute dyspnea   . Elevated d-dimer   . CHF (congestive heart failure)   . Headaches due to old head injury   . Essential hypertension   . Pneumonia due to infectious organism, unspecified laterality, unspecified part of lung   . Syncope and collapse   . AICD (automatic cardioverter/defibrillator) present   . Syncope, unspecified syncope type   . Pneumonia due to infectious organism   . Dyspnea, unspecified type   . Cardiomyopathy   . Cough   . Sepsis   . AKI (acute kidney injury)   . Sepsis, due to unspecified organism   . SOB (shortness of breath)   . Acute on chronic systolic CHF (congestive heart failure)       Current Medications:  Current Outpatient Prescriptions   Medication Sig Dispense Refill   . albuterol (PROAIR HFA) 108 (90 Base) MCG/ACT inhaler ProAir HFA 90 mcg/actuation aerosol inhaler   INHALE 2 PUFFS PO  Q 4 H PRF WHEEZING OR SOB     . amitriptyline (ELAVIL) 25 MG tablet amitriptyline 25 mg tablet   Take 1 tablet every day by oral route.     Marland Kitchen amlodipine-valsartan (EXFORGE) 10-320 MG per tablet amlodipine 10 mg-valsartan 320 mg tablet     . amoxicillin-clavulanate (AUGMENTIN) 875-125 MG per tablet amoxicillin 875 mg-potassium clavulanate 125 mg tablet     . atorvastatin (LIPITOR) 40 MG tablet Take 1  tablet (40 mg total) by mouth nightly. 30 tablet 0   . budesonide-formoterol (SYMBICORT) 80-4.5 MCG/ACT inhaler Symbicort 80 mcg-4.5 mcg/actuation HFA aerosol inhaler   INHALE 2 PUFFS PO BID.     Marland Kitchen carvedilol (COREG) 25 MG tablet Take 1 tablet (25 mg total) by mouth 2 (two) times daily with meals. 180 tablet 3   . docusate sodium (COLACE) 100 MG capsule Take 1 capsule (100 mg total) by mouth 2 (two) times daily.     Marland Kitchen esomeprazole (NEXIUM) 40 MG capsule Take 40 mg by mouth every morning before breakfast.         . fluticasone-salmeterol (ADVAIR DISKUS) 100-50 MCG/DOSE Aerosol Powder, Breath Activtivatede Inhale 1 puff into the lungs 2 (two) times daily. 1 each 2   . furosemide (LASIX) 40 MG tablet Take 2 tablets (80 mg total) by mouth 2 (two) times daily. 60 tablet 0   . glucose blood (ONE TOUCH ULTRA TEST) test strip 1 each by Other route 2 (two) times daily.Use as instructed 300 each 1   . guaiFENesin (MUCINEX) 600 MG 12 hr tablet Mucinex  600 mg tablet, extended release     . hydrocodone-chlorpheniramine (TUSSIONEX) 10-8 MG/5ML suspension hydrocodone 10 mg-chlorpheniramine 8 mg/5 mL oral susp extend.rel 12hr     . insulin glargine (LANTUS) 100 UNIT/ML injection Inject 45 Units into the skin nightly. 10 mL 0   . lisinopril (PRINIVIL,ZESTRIL) 10 MG tablet Take 1 tablet (10 mg total) by mouth daily. 30 tablet 0   . niacin (NIASPAN) 500 MG CR tablet niacin ER 500 mg tablet,extended release 24 hr 180 tablet 3   . polyethylene glycol (MIRALAX) packet Take 17 g by mouth daily as needed (constipation). 24 each 0   . Probiotic Product (RISAQUAD PO) Risaquad 8 billion cell capsule   TK ONE C PO  QD     . SITagliptin-metFORMIN (JANUMET) 50-1000 MG tablet Take 1 tablet by mouth 2 (two) times daily with meals.     . tamsulosin (FLOMAX) 0.4 MG Cap Take 1 capsule (0.4 mg total) by mouth Daily after dinner. 30 capsule 0     Current Facility-Administered Medications   Medication Dose Route Frequency Provider Last Rate Last Dose    . levalbuterol (XOPENEX) nebulizer solution 1.25 mg  1.25 mg Nebulization TID Zorita Pang, MD           Allergies:  No Known Allergies    Past Medical History:  Past Medical History:   Diagnosis Date   . AICD (automatic cardioverter/defibrillator) present 2014   . CAD (coronary artery disease)    . Cardiac defibrillator in situ    . CHF (congestive heart failure) 08/12/2014   . Diabetes mellitus    . Heart attack 2011    and 2014   . Hyperlipemia    . Hypertension    . Ischemic cardiomyopathy     20%   . Pneumonia 09/2016       Past Surgical History:  Past Surgical History:   Procedure Laterality Date   . CARDIAC CATHETERIZATION     . CARDIAC DEFIBRILLATOR PLACEMENT     . CARDIAC PACEMAKER PLACEMENT     . CIRCUMCISION     . CORONARY STENT PLACEMENT  2011   . duodenal ulcer  1973   . EGD N/A 08/15/2014    Procedure: EGD;  Surgeon: Colon Branch, MD;  [facility];  Service: Gastroenterology;  Laterality: N/A;  egd w/ bx   . HERNIA REPAIR      [facility]   . orthopedic surgery      R foot corrective   . TONSILLECTOMY AND ADENOIDECTOMY  1954       Family History:  Family History   Problem Relation Age of Onset   . Breast cancer Mother    . Heart attack Mother 65   . Hypertension Mother    . Diabetes Mother    . Diabetes Brother    . Heart attack Father    . Hypertension Father        Social History:  Social History     Social History   . Marital status: Married     Spouse name: Lajoyce Corners   . Number of children: 0   . Years of education: N/A     Occupational History   . 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 Smoking status: Never Smoker   . Smokeless tobacco: Never Used   . Alcohol use No   . Drug use: No   . Sexual activity: Yes  Partners: Female     Other Topics Concern   . Not on file     Social History Narrative   . No narrative on file       The following sections were reviewed this encounter by the provider:   Tobacco  Allergies  Meds  Problems  Med Hx  Surg Hx   Fam Hx  Soc Hx          Vitals:  BP 100/63   Pulse 89   Temp 97.3 F (36.3 C) (Oral)   Wt 90.4 kg (199 lb 3.2 oz)   SpO2 98%   BMI 27.02 kg/m     ROS:  Constitutional: Negative for fever, chills and malaise/fatigue.   Respiratory: Negative for cough, shortness of breath and wheezing.    Cardiovascular: Negative for chest pain and palpitations.   Gastrointestinal: Negative for abdominal pain, diarrhea and constipation.   Musculoskeletal: Negative for joint pain.   Skin: Negative for rash.   Neurological: Negative for dizziness and headaches.   Psychiatric/Behavioral: The patient does not have insomnia.         Objective:     Physical Exam:  Constitutional: Patient is oriented to person, place, and time. Appears well-developed and well-nourished.   HENT:   Head: Normocephalic and atraumatic.   Eyes: Conjunctivae and EOM are normal. Pupils are equal, round, and reactive to light.   Cardiovascular: Normal rate, regular rhythm and normal heart sounds.  Exam reveals no gallop and no friction rub.    No murmur heard.  Pulmonary/Chest: Effort normal and breath sounds normal. Has no wheezes.   Musculoskeletal: Normal range of motion. Exhibits no edema or tenderness.   Neurological: Patient is alert and oriented to person, place, and time.   Psychiatric: Patient has a normal mood and affect.        Assessment/Plan:       1. Coronary artery disease involving coronary bypass graft of native heart with unstable angina pectoris  Swelling in legs is gone , patient is back to normal.    2. Essential hypertension  Well controlled       Zorita Pang, MD        Have you seen any specialists/other providers since your last visit with [place]?    Yes, pulmonologist and cardiologist     Arm preference verified?   Yes    The patient is due for eye exam and foot exam

## 2017-10-01 NOTE — Patient Instructions (Signed)
Recommend to continue to push exercise and stay on a good diet . Try to get to the perfect BMI of 25. Start your day with a high fiber diet and  involve the family in your exercise regiment .

## 2017-10-01 NOTE — Telephone Encounter (Signed)
Left message for patient to call back  

## 2017-10-04 ENCOUNTER — Encounter (INDEPENDENT_AMBULATORY_CARE_PROVIDER_SITE_OTHER): Payer: Self-pay | Admitting: Family Medicine

## 2017-10-05 ENCOUNTER — Other Ambulatory Visit (INDEPENDENT_AMBULATORY_CARE_PROVIDER_SITE_OTHER): Payer: Self-pay | Admitting: Family Medicine

## 2017-10-05 MED ORDER — NIACIN ER (ANTIHYPERLIPIDEMIC) 500 MG PO TBCR
500.00 mg | EXTENDED_RELEASE_TABLET | Freq: Two times a day (BID) | ORAL | 3 refills | Status: DC
Start: 2017-10-05 — End: 2020-01-26

## 2017-10-06 ENCOUNTER — Encounter (INDEPENDENT_AMBULATORY_CARE_PROVIDER_SITE_OTHER): Payer: Self-pay | Admitting: Family Medicine

## 2017-10-06 NOTE — Telephone Encounter (Signed)
Left message for patient to call back  

## 2017-10-07 ENCOUNTER — Encounter (INDEPENDENT_AMBULATORY_CARE_PROVIDER_SITE_OTHER): Payer: Self-pay | Admitting: Family Medicine

## 2017-10-15 NOTE — Telephone Encounter (Signed)
Dr.Noelle - can we close this encounter? Med was already filled for patient on 10/01/2017 for 1 year. Medication will have to be refused by you in order to close the encounter. Thank you

## 2017-10-21 ENCOUNTER — Ambulatory Visit (INDEPENDENT_AMBULATORY_CARE_PROVIDER_SITE_OTHER): Payer: Self-pay | Admitting: Cardiovascular Disease

## 2017-11-02 ENCOUNTER — Other Ambulatory Visit: Payer: Self-pay

## 2017-11-02 DIAGNOSIS — I5022 Chronic systolic (congestive) heart failure: Secondary | ICD-10-CM

## 2017-11-10 ENCOUNTER — Encounter (INDEPENDENT_AMBULATORY_CARE_PROVIDER_SITE_OTHER): Payer: Self-pay | Admitting: Family Medicine

## 2017-11-12 ENCOUNTER — Telehealth (INDEPENDENT_AMBULATORY_CARE_PROVIDER_SITE_OTHER): Payer: Self-pay | Admitting: Family Medicine

## 2017-11-12 NOTE — Telephone Encounter (Signed)
Name, strength, directions of requested refill(s):    furosemide (LASIX) 40 MG tablet    PT IS OUT OF MEDICATION. Pt requesting temp med ref until next LOV 2/13.     Pharmacy to send refill to or patient to pick up rx from office (mark requested pharmacy in BOLD):      [id number] [place], [place] - [address] [facility] [address]  [address]  [place] [address]  Phone: [phone] Fax: [phone]        Please mark "X" next to the preferred call back number:    Mobile:   Telephone Information:   Mobile [phone]         X   Home: [phone]    Work:           Next visit: 12/01/2017

## 2017-11-14 ENCOUNTER — Other Ambulatory Visit (INDEPENDENT_AMBULATORY_CARE_PROVIDER_SITE_OTHER): Payer: Self-pay | Admitting: Family Medicine

## 2017-11-15 ENCOUNTER — Other Ambulatory Visit (INDEPENDENT_AMBULATORY_CARE_PROVIDER_SITE_OTHER): Payer: Self-pay

## 2017-11-15 NOTE — Telephone Encounter (Signed)
Last OV on 10/01/2017

## 2017-11-16 MED ORDER — FUROSEMIDE 40 MG PO TABS
80.00 mg | ORAL_TABLET | Freq: Two times a day (BID) | ORAL | 2 refills | Status: DC
Start: 2017-11-16 — End: 2017-12-01

## 2017-11-16 NOTE — Telephone Encounter (Signed)
See refill encounter 1/28

## 2017-12-01 ENCOUNTER — Ambulatory Visit (INDEPENDENT_AMBULATORY_CARE_PROVIDER_SITE_OTHER): Payer: Commercial Managed Care - POS | Admitting: Family Medicine

## 2017-12-01 ENCOUNTER — Encounter (INDEPENDENT_AMBULATORY_CARE_PROVIDER_SITE_OTHER): Payer: Self-pay | Admitting: Family Medicine

## 2017-12-01 VITALS — BP 123/86 | HR 92 | Ht 72.0 in | Wt 202.0 lb

## 2017-12-01 DIAGNOSIS — E118 Type 2 diabetes mellitus with unspecified complications: Secondary | ICD-10-CM

## 2017-12-01 DIAGNOSIS — I10 Essential (primary) hypertension: Secondary | ICD-10-CM

## 2017-12-01 DIAGNOSIS — I509 Heart failure, unspecified: Secondary | ICD-10-CM

## 2017-12-01 DIAGNOSIS — Z794 Long term (current) use of insulin: Secondary | ICD-10-CM

## 2017-12-01 MED ORDER — FUROSEMIDE 40 MG PO TABS
80.00 mg | ORAL_TABLET | Freq: Two times a day (BID) | ORAL | 2 refills | Status: DC
Start: 2017-12-01 — End: 2018-09-26

## 2017-12-01 MED ORDER — TAMSULOSIN HCL 0.4 MG PO CAPS
0.40 mg | ORAL_CAPSULE | Freq: Every day | ORAL | 3 refills | Status: DC
Start: 2017-12-01 — End: 2018-12-26

## 2017-12-01 NOTE — Progress Notes (Signed)
Subjective:      Date: 12/01/2017 4:05 PM   Patient ID: [name] [name] is a 72 y.o. male.    Chief Complaint:  Chief Complaint   Patient presents with   . Hypertension     Pt is not checking bp at home.        HPI:  HPI            Patient feels good . Is on a good diet and is exercising .    Problem List:  Patient Active Problem List   Diagnosis   . Chest pain   . Diabetes mellitus   . Heart attack   . CAD (coronary artery disease)   . Non-ischemic cardiomyopathy   . Acute exacerbation of CHF (congestive heart failure)   . PUD (peptic ulcer disease)   . Elevated LFTs   . Acute dyspnea   . Elevated d-dimer   . CHF (congestive heart failure)   . Headaches due to old head injury   . Essential hypertension   . Pneumonia due to infectious organism, unspecified laterality, unspecified part of lung   . Syncope and collapse   . AICD (automatic cardioverter/defibrillator) present   . Syncope, unspecified syncope type   . Pneumonia due to infectious organism   . Dyspnea, unspecified type   . Cardiomyopathy   . Cough   . Sepsis   . AKI (acute kidney injury)   . Sepsis, due to unspecified organism   . SOB (shortness of breath)   . Acute on chronic systolic CHF (congestive heart failure)       Current Medications:  Current Outpatient Prescriptions   Medication Sig Dispense Refill   . amitriptyline (ELAVIL) 25 MG tablet amitriptyline 25 mg tablet   Take 1 tablet every day by oral route.     Marland Kitchen amlodipine-valsartan (EXFORGE) 10-320 MG per tablet amlodipine 10 mg-valsartan 320 mg tablet     . atorvastatin (LIPITOR) 40 MG tablet Take 1 tablet (40 mg total) by mouth nightly. 30 tablet 0   . budesonide-formoterol (SYMBICORT) 80-4.5 MCG/ACT inhaler Symbicort 80 mcg-4.5 mcg/actuation HFA aerosol inhaler   INHALE 2 PUFFS PO BID.     Marland Kitchen carvedilol (COREG) 25 MG tablet Take 1 tablet (25 mg total) by mouth 2 (two) times daily with meals. 180 tablet 3   . docusate sodium (COLACE) 100 MG capsule Take 1 capsule (100 mg total) by mouth  2 (two) times daily.     Marland Kitchen esomeprazole (NEXIUM) 40 MG capsule Take 40 mg by mouth every morning before breakfast.         . fluticasone-salmeterol (ADVAIR DISKUS) 100-50 MCG/DOSE Aerosol Powder, Breath Activtivatede Inhale 1 puff into the lungs 2 (two) times daily. 1 each 2   . furosemide (LASIX) 40 MG tablet Take 2 tablets (80 mg total) by mouth 2 (two) times daily. 60 tablet 2   . glucose blood (ONE TOUCH ULTRA TEST) test strip 1 each by Other route 2 (two) times daily.Use as instructed 300 each 1   . guaiFENesin (MUCINEX) 600 MG 12 hr tablet Mucinex 600 mg tablet, extended release     . insulin glargine (LANTUS) 100 UNIT/ML injection Inject 45 Units into the skin nightly. 10 mL 0   . lisinopril (PRINIVIL,ZESTRIL) 10 MG tablet Take 1 tablet (10 mg total) by mouth daily. 30 tablet 0   . niacin (NIASPAN) 500 MG CR tablet Take 1 tablet (500 mg total) by mouth 2 (two) times daily.niacin ER 500 mg tablet,extended  release 24 hr 180 tablet 3   . polyethylene glycol (MIRALAX) packet Take 17 g by mouth daily as needed (constipation). 24 each 0   . SITagliptin-metFORMIN (JANUMET) 50-1000 MG tablet Take 1 tablet by mouth 2 (two) times daily with meals.     . tamsulosin (FLOMAX) 0.4 MG Cap Take 1 capsule (0.4 mg total) by mouth Daily after dinner. 30 capsule 0   . albuterol (PROAIR HFA) 108 (90 Base) MCG/ACT inhaler ProAir HFA 90 mcg/actuation aerosol inhaler   INHALE 2 PUFFS PO  Q 4 H PRF WHEEZING OR SOB     . Probiotic Product (RISAQUAD PO) Risaquad 8 billion cell capsule   TK ONE C PO  QD       Current Facility-Administered Medications   Medication Dose Route Frequency Provider Last Rate Last Dose   . levalbuterol (XOPENEX) nebulizer solution 1.25 mg  1.25 mg Nebulization TID Zorita Pang, MD           Allergies:  No Known Allergies    Past Medical History:  Past Medical History:   Diagnosis Date   . AICD (automatic cardioverter/defibrillator) present 2014   . CAD (coronary artery disease)    . Cardiac defibrillator in  situ    . CHF (congestive heart failure) 08/12/2014   . Diabetes mellitus    . Heart attack 2011    and 2014   . Hyperlipemia    . Hypertension    . Ischemic cardiomyopathy     20%   . Pneumonia 09/2016       Past Surgical History:  Past Surgical History:   Procedure Laterality Date   . CARDIAC CATHETERIZATION     . CARDIAC DEFIBRILLATOR PLACEMENT     . CARDIAC PACEMAKER PLACEMENT     . CIRCUMCISION     . CORONARY STENT PLACEMENT  2011   . duodenal ulcer  1973   . EGD N/A 08/15/2014    Procedure: EGD;  Surgeon: Colon Branch, MD;  Location: [facility];  Service: Gastroenterology;  Laterality: N/A;  egd w/ bx   . HERNIA REPAIR      [facility]   . orthopedic surgery      R foot corrective   . TONSILLECTOMY AND ADENOIDECTOMY  1954       Family History:  Family History   Problem Relation Age of Onset   . Breast cancer Mother    . Heart attack Mother 30   . Hypertension Mother    . Diabetes Mother    . Diabetes Brother    . Heart attack Father    . Hypertension Father        Social History:  Social History     Social History   . Marital status: Married     Spouse name: Lajoyce Corners   . Number of children: 0   . Years of education: N/A     Occupational History   . 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 Smoking status: Never Smoker   . Smokeless tobacco: Never Used   . Alcohol use No   . Drug use: No   . Sexual activity: Yes     Partners: Female     Other Topics Concern   . Not on file     Social History Narrative   . No narrative on file       The following sections were reviewed this encounter by the provider:  Tobacco  Allergies  Meds  Problems  Med Hx  Surg Hx  Fam Hx  Soc Hx          Vitals:  BP 123/86 (BP Site: Left arm)   Pulse 92   Ht 1.829 m (6')   Wt 91.6 kg (202 lb)   SpO2 98%   BMI 27.40 kg/m Have you seen any specialists/other providers since your last visit with [place]?    No    ROS:  Constitutional: Negative for fever, chills and malaise/fatigue.   Respiratory: Negative for  cough, shortness of breath and wheezing.    Cardiovascular: Negative for chest pain and palpitations.   Gastrointestinal: Negative for abdominal pain, diarrhea and constipation.   Musculoskeletal: Negative for joint pain.   Skin: Negative for rash.   Neurological: Negative for dizziness and headaches.   Psychiatric/Behavioral: The patient does not have insomnia.         Objective:     Physical Exam:  Constitutional: Patient is oriented to person, place, and time. Appears well-developed and well-nourished.   HENT:   Head: Normocephalic and atraumatic.   Eyes: Conjunctivae and EOM are normal. Pupils are equal, round, and reactive to light.   Cardiovascular: Normal rate, regular rhythm and normal heart sounds.  Exam reveals no gallop and no friction rub.    No murmur heard.  Pulmonary/Chest: Effort normal and breath sounds normal. Has no wheezes.   Musculoskeletal: Normal range of motion. Exhibits no edema or tenderness.   Neurological: Patient is alert and oriented to person, place, and time.   Psychiatric: Patient has a normal mood and affect.        Assessment/Plan:       1. Essential hypertension  - CBC and differential; Future  - Comprehensive metabolic panel; Future  Check BP from time to time . If always below 140/90, than BP is considered controlled .If above f/u. Concentrate on exercise and diet .      2. Type 2 diabetes mellitus with complication, with long-term current use of insulin  - Hemoglobin A1C; Future  - Microalbumin, Random Urine; Future  Recommend to continue to push exercise and stay on a good diet . Try to get to the perfect BMI of 25. Start your day with a high fiber diet and  involve the family in your exercise regiment .      3. Acute on chronic congestive heart failure, unspecified heart failure type  - Lipid panel; Future          Zorita Pang, MD        Arm preference verified?   Yes    The patient is due for eye exam, foot exam, shingles vaccine and Medicare annual wellness

## 2017-12-11 ENCOUNTER — Ambulatory Visit (INDEPENDENT_AMBULATORY_CARE_PROVIDER_SITE_OTHER): Payer: Self-pay

## 2017-12-14 ENCOUNTER — Telehealth (INDEPENDENT_AMBULATORY_CARE_PROVIDER_SITE_OTHER): Payer: Self-pay | Admitting: Family Medicine

## 2017-12-14 ENCOUNTER — Other Ambulatory Visit (INDEPENDENT_AMBULATORY_CARE_PROVIDER_SITE_OTHER): Payer: Self-pay | Admitting: Family Medicine

## 2017-12-14 DIAGNOSIS — Z0000 Encounter for general adult medical examination without abnormal findings: Secondary | ICD-10-CM

## 2017-12-14 NOTE — Telephone Encounter (Signed)
Pt has appt for Physical BW on 3/11  After that he has appt with you on 3/25    Kindly put in the order for BW.  I see there is some order for BW, please add if anything needs to be added.    Thanks

## 2017-12-14 NOTE — Telephone Encounter (Signed)
All orders are already in  the computer .

## 2017-12-17 ENCOUNTER — Encounter (INDEPENDENT_AMBULATORY_CARE_PROVIDER_SITE_OTHER): Payer: Self-pay | Admitting: Family Medicine

## 2017-12-20 ENCOUNTER — Ambulatory Visit (INDEPENDENT_AMBULATORY_CARE_PROVIDER_SITE_OTHER): Payer: Self-pay

## 2017-12-20 ENCOUNTER — Other Ambulatory Visit (INDEPENDENT_AMBULATORY_CARE_PROVIDER_SITE_OTHER): Payer: Self-pay

## 2017-12-20 ENCOUNTER — Other Ambulatory Visit (INDEPENDENT_AMBULATORY_CARE_PROVIDER_SITE_OTHER): Payer: Self-pay | Admitting: Family Medicine

## 2017-12-20 DIAGNOSIS — Z006 Encounter for examination for normal comparison and control in clinical research program: Secondary | ICD-10-CM | POA: Insufficient documentation

## 2017-12-27 ENCOUNTER — Other Ambulatory Visit (FREE_STANDING_LABORATORY_FACILITY): Payer: Commercial Managed Care - POS

## 2017-12-27 DIAGNOSIS — E118 Type 2 diabetes mellitus with unspecified complications: Secondary | ICD-10-CM

## 2017-12-27 DIAGNOSIS — I10 Essential (primary) hypertension: Secondary | ICD-10-CM

## 2017-12-27 DIAGNOSIS — I509 Heart failure, unspecified: Secondary | ICD-10-CM

## 2017-12-27 DIAGNOSIS — Z794 Long term (current) use of insulin: Secondary | ICD-10-CM

## 2017-12-27 LAB — COMPREHENSIVE METABOLIC PANEL
ALT: 43 U/L (ref 0–55)
AST (SGOT): 30 U/L (ref 5–34)
Albumin/Globulin Ratio: 1.4 (ref 0.9–2.2)
Albumin: 3.7 g/dL (ref 3.5–5.0)
Alkaline Phosphatase: 138 U/L — ABNORMAL HIGH (ref 38–106)
BUN: 20 mg/dL (ref 9.0–28.0)
Bilirubin, Total: 1.4 mg/dL — ABNORMAL HIGH (ref 0.2–1.2)
CO2: 24 mEq/L (ref 21–29)
Calcium: 9.5 mg/dL (ref 7.9–10.2)
Chloride: 108 mEq/L (ref 100–111)
Creatinine: 1.3 mg/dL (ref 0.5–1.5)
Globulin: 2.7 g/dL (ref 2.0–3.7)
Glucose: 122 mg/dL — ABNORMAL HIGH (ref 70–100)
Potassium: 4.4 mEq/L (ref 3.5–5.1)
Protein, Total: 6.4 g/dL (ref 6.0–8.3)
Sodium: 143 mEq/L (ref 136–145)

## 2017-12-27 LAB — LIPID PANEL
Cholesterol / HDL Ratio: 3.4
Cholesterol: 104 mg/dL (ref 0–199)
HDL: 31 mg/dL — ABNORMAL LOW (ref 40–9999)
LDL Calculated: 62 mg/dL (ref 0–99)
Triglycerides: 56 mg/dL (ref 34–149)
VLDL Calculated: 11 mg/dL (ref 10–40)

## 2017-12-27 LAB — CBC AND DIFFERENTIAL
Absolute NRBC: 0 10*3/uL (ref 0.00–0.00)
Basophils Absolute Automated: 0.02 10*3/uL (ref 0.00–0.08)
Basophils Automated: 0.3 %
Eosinophils Absolute Automated: 0.13 10*3/uL (ref 0.00–0.44)
Eosinophils Automated: 1.6 %
Hematocrit: 37.7 % (ref 37.6–49.6)
Hgb: 13.9 g/dL (ref 12.5–17.1)
Immature Granulocytes Absolute: 0.01 10*3/uL (ref 0.00–0.07)
Immature Granulocytes: 0.1 %
Lymphocytes Absolute Automated: 4.31 10*3/uL — ABNORMAL HIGH (ref 0.42–3.22)
Lymphocytes Automated: 54.1 %
MCH: 38.5 pg — ABNORMAL HIGH (ref 25.1–33.5)
MCHC: 36.9 g/dL — ABNORMAL HIGH (ref 31.5–35.8)
MCV: 104.4 fL — ABNORMAL HIGH (ref 78.0–96.0)
MPV: 14.1 fL — ABNORMAL HIGH (ref 8.9–12.5)
Monocytes Absolute Automated: 0.51 10*3/uL (ref 0.21–0.85)
Monocytes: 6.4 %
Neutrophils Absolute: 2.98 10*3/uL (ref 1.10–6.33)
Neutrophils: 37.5 %
Nucleated RBC: 0 /100 WBC (ref 0.0–0.0)
Platelets: 130 10*3/uL — ABNORMAL LOW (ref 142–346)
RBC: 3.61 10*6/uL — ABNORMAL LOW (ref 4.20–5.90)
WBC: 7.96 10*3/uL (ref 3.10–9.50)

## 2017-12-27 LAB — HEMOLYSIS INDEX: Hemolysis Index: 15 (ref 0–18)

## 2017-12-27 LAB — CELL MORPHOLOGY
Cell Morphology: ABNORMAL — AB
Platelet Estimate: DECREASED — AB

## 2017-12-27 LAB — HEMOGLOBIN A1C
Average Estimated Glucose: 165.7 mg/dL
Hemoglobin A1C: 7.4 % — ABNORMAL HIGH (ref 4.6–5.9)

## 2017-12-27 LAB — MICROALBUMIN, RANDOM URINE
Urine Creatinine, Random: 278.1 mg/dL
Urine Microalbumin, Random: >2000 — ABNORMAL HIGH (ref 0.0–30.0)

## 2017-12-27 LAB — GFR: EGFR: >60

## 2017-12-29 ENCOUNTER — Other Ambulatory Visit (INDEPENDENT_AMBULATORY_CARE_PROVIDER_SITE_OTHER): Payer: Self-pay | Admitting: Family Medicine

## 2017-12-29 DIAGNOSIS — R809 Proteinuria, unspecified: Secondary | ICD-10-CM

## 2017-12-31 ENCOUNTER — Encounter (INDEPENDENT_AMBULATORY_CARE_PROVIDER_SITE_OTHER): Payer: Commercial Managed Care - POS | Admitting: Family Medicine

## 2018-01-10 ENCOUNTER — Ambulatory Visit (INDEPENDENT_AMBULATORY_CARE_PROVIDER_SITE_OTHER): Payer: Commercial Managed Care - POS | Admitting: Family Medicine

## 2018-01-10 ENCOUNTER — Encounter (INDEPENDENT_AMBULATORY_CARE_PROVIDER_SITE_OTHER): Payer: Self-pay | Admitting: Family Medicine

## 2018-01-10 VITALS — BP 126/84 | HR 93 | Temp 97.4°F | Ht 70.0 in | Wt 212.8 lb

## 2018-01-10 DIAGNOSIS — R809 Proteinuria, unspecified: Secondary | ICD-10-CM

## 2018-01-10 DIAGNOSIS — N433 Hydrocele, unspecified: Secondary | ICD-10-CM

## 2018-01-10 DIAGNOSIS — Z0000 Encounter for general adult medical examination without abnormal findings: Secondary | ICD-10-CM

## 2018-01-10 NOTE — Progress Notes (Addendum)
Date: 01/10/2018 10:28 AM   Patient ID: [name] [name] is a 72 y.o. male.         Have you seen any specialists/other providers since your last visit with [place]?    Yes, pulmonologist and cardiologist      Arm preference verified?   Yes    The patient is due for eye exam, foot exam and shingles vaccine    Subjective:      Chief Complaint:  Chief Complaint   Patient presents with   . Annual Exam     patient is fasting        HPI:  Visit Type: Health Maintenance Visit  Work Status: working full-time, history 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Reported Health: fair health  Reported Diet: moderate compliance with low sodium diet and low fat/cholesterol diet  Reported Exercise: 3-4x/week, 30-60 minutes/day and exercises at home  Dental: regular dental visits twice a year  Vision: glasses and regular eye exams   Hearing: normal hearing  Immunization Status: immunizations up to date  Reproductive Health: sexually active  Prior Screening Tests: more than 10 years.  General Health Risks: no family history of prostate cancer, no family history of colon cancer, family history of breast cancer and mother had breast cancer  Safety Elements Used: uses seat belts, smoke detectors in household and does not text and drive     Patient feels overall well . Is exercising and on a good diet .      Additional concerns: patient reports GI issues. Episode onset x 1 month. Symptoms include stomach irrigations and constipation, and pain located generalized abdominal area.     Problem List:  Patient Active Problem List   Diagnosis   . Chest pain   . Diabetes mellitus   . Heart attack   . CAD (coronary artery disease)   . Non-ischemic cardiomyopathy   . Acute exacerbation of CHF (congestive heart failure)   . PUD (peptic ulcer disease)   . Elevated LFTs   . Acute dyspnea   . Elevated d-dimer   . CHF (congestive heart failure)   . Headaches due to old head injury   . Essential hypertension   . Pneumonia due to infectious organism, unspecified laterality, unspecified  part of lung   . Syncope and collapse   . AICD (automatic cardioverter/defibrillator) present   . Syncope, unspecified syncope type   . Pneumonia due to infectious organism   . Dyspnea, unspecified type   . Cardiomyopathy   . Cough   . Sepsis   . AKI (acute kidney injury)   . Sepsis, due to unspecified organism   . SOB (shortness of breath)   . Acute on chronic systolic CHF (congestive heart failure)       Current Medications:  Current Outpatient Prescriptions   Medication Sig Dispense Refill   . albuterol (PROAIR HFA) 108 (90 Base) MCG/ACT inhaler ProAir HFA 90 mcg/actuation aerosol inhaler   INHALE 2 PUFFS PO  Q 4 H PRF WHEEZING OR SOB     . amitriptyline (ELAVIL) 25 MG tablet amitriptyline 25 mg tablet   Take 1 tablet every day by oral route.     Marland Kitchen atorvastatin (LIPITOR) 40 MG tablet Take 1 tablet (40 mg total) by mouth nightly. 30 tablet 0   . budesonide-formoterol (SYMBICORT) 80-4.5 MCG/ACT inhaler Symbicort 80 mcg-4.5 mcg/actuation HFA aerosol inhaler   INHALE 2 PUFFS PO BID.     Marland Kitchen carvedilol (COREG) 25 MG tablet Take 1 tablet (25 mg total) by  mouth 2 (two) times daily with meals. 180 tablet 3   . docusate sodium (COLACE) 100 MG capsule Take 1 capsule (100 mg total) by mouth 2 (two) times daily.     Marland Kitchen esomeprazole (NEXIUM) 40 MG capsule Take 40 mg by mouth every morning before breakfast.         . fluticasone-salmeterol (ADVAIR DISKUS) 100-50 MCG/DOSE Aerosol Powder, Breath Activtivatede Inhale 1 puff into the lungs 2 (two) times daily. 1 each 2   . furosemide (LASIX) 40 MG tablet Take 2 tablets (80 mg total) by mouth 2 (two) times daily. 60 tablet 2   . glucose blood (ONE TOUCH ULTRA TEST) test strip 1 each by Other route 2 (two) times daily.Use as instructed 300 each 1   . insulin glargine (LANTUS) 100 UNIT/ML injection Inject 45 Units into the skin nightly. 10 mL 0   . JANUMET 50-1000 MG tablet TAKE 1 TABLET BY MOUTH TWICE DAILY WITH FOOD. 180 tablet 3   . lisinopril (PRINIVIL,ZESTRIL) 10 MG tablet Take 1  tablet (10 mg total) by mouth daily. 30 tablet 0   . niacin (NIASPAN) 500 MG CR tablet Take 1 tablet (500 mg total) by mouth 2 (two) times daily.niacin ER 500 mg tablet,extended release 24 hr 180 tablet 3   . polyethylene glycol (MIRALAX) packet Take 17 g by mouth daily as needed (constipation). 24 each 0   . tamsulosin (FLOMAX) 0.4 MG Cap Take 1 capsule (0.4 mg total) by mouth Daily after dinner. 90 capsule 3     Current Facility-Administered Medications   Medication Dose Route Frequency Provider Last Rate Last Dose   . levalbuterol (XOPENEX) nebulizer solution 1.25 mg  1.25 mg Nebulization TID Zorita Pang, MD           Allergies:  No Known Allergies    Past Medical History:  Past Medical History:   Diagnosis Date   . AICD (automatic cardioverter/defibrillator) present 2014   . CAD (coronary artery disease)    . Cardiac defibrillator in situ    . CHF (congestive heart failure) 08/12/2014   . Diabetes mellitus    . Heart attack 2011    and 2014   . Hyperlipemia    . Hypertension    . Ischemic cardiomyopathy     20%   . Pneumonia 09/2016       Past Surgical History:  Past Surgical History:   Procedure Laterality Date   . CARDIAC CATHETERIZATION     . CARDIAC DEFIBRILLATOR PLACEMENT     . CARDIAC PACEMAKER PLACEMENT     . CIRCUMCISION     . CORONARY STENT PLACEMENT  2011   . duodenal ulcer  1973   . EGD N/A 08/15/2014    Procedure: EGD;  Surgeon: Colon Branch, MD;  Location: [facility];  Service: Gastroenterology;  Laterality: N/A;  egd w/ bx   . HERNIA REPAIR      [facility]   . orthopedic surgery      R foot corrective   . TONSILLECTOMY AND ADENOIDECTOMY  1954       Family History:  Family History   Problem Relation Age of Onset   . Breast cancer Mother    . Heart attack Mother 77   . Hypertension Mother    . Diabetes Mother    . Diabetes Brother    . Heart attack Father    . Hypertension Father        Social History:  Social History  Social History   . Marital status: Married     Spouse name: Lajoyce Corners   .  Number of children: 0   . Years of education: N/A     Occupational History   . 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 Smoking status: Never Smoker   . Smokeless tobacco: Never Used   . Alcohol use No   . Drug use: No   . Sexual activity: Yes     Partners: Female     Other Topics Concern   . Not on file     Social History Narrative   . No narrative on file       The following sections were reviewed this encounter by the provider:   Tobacco  Allergies  Meds  Problems  Med Hx  Surg Hx  Fam Hx  Soc Hx          Vitals:  BP 126/84   Pulse 93   Temp 97.4 F (36.3 C) (Oral)   Ht 1.778 m (5\' 10" )   Wt 96.5 kg (212 lb 12.8 oz)   SpO2 96%   BMI 30.53 kg/m      ROS:   General/Constitutional:   Denies Change in appetite. Denies Chills. Denies Fatigue. Denies Fever. Denies Weight gain. Denies Weight loss.   Ophthalmologic:   Denies Blurred vision. Denies Diminished visual acuity. Denies Eye Pain.   ENT:   Denies Hearing Loss. Denies Nasal Discharge. Denies Hoarseness. Denies Ear pain. Denies Nosebleed. Denies Sinus pain. Denies Sore throat. Denies Sneezing.   Endocrine:   Denies Polydipsia. Denies Polyuria.   Cardiovascular:   Denies Chest pain. Denies Chest pain with exertion. Denies Leg Claudication. Denies Palpitations. Denies Swelling in hands/feet.   Respiratory:   Denies Paroxysmal Nocturnal Dyspnea. Denies Cough. Denies Orthopnea. Denies Shortness of breath. Denies Daytime Hypersomnolence. Denies Snoring. Denies Witness Apnea. Denies Wheezing.   Gastrointestinal:   Denies Abdominal pain. Denies Blood in stool. Denies Constipation. Denies Diarrhea. Denies Heartburn. Denies Nausea. Denies Vomiting.   Hematology:   Denies Easy bruising. Denies Easy Bleeding.   Genitourinary:   Denies Blood in urine. Denies Nocturia.   Musculoskeletal:   Denies Joint pain. Denies Leg cramps. Denies Weakness in LE. Denies Swollen joints.   Peripheral Vascular:   Denies Cold extremities. Denies Decreased  sensation in extremities. Denies Painful extremities.   Skin:   Denies Itching. Denies Change in Mole(s). Denies Rash.   Neurologic:   Denies Balance difficulty. Denies Dizziness. Denies Headache. Denies Pre-Syncope. Denies Memory loss.   Psychiatric:   Denies Anxiety. Denies Depressed mood. Denies Difficulty sleeping. Denies Mood Swings.     Objective:     Examination:   General Examination:   GENERAL APPEARANCE: alert, in no acute distress, well developed, well nourished, oriented to time, place, and person.   HEAD: normal appearance, atraumatic.   EYES: extraocular movement intact (EOMI), pupils equal, round, reactive to light and accommodation, sclera anicteric, conjunctiva clear.   EARS: tympanic membranes normal bilaterally, external canals normal .   NOSE: normal nasal mucosa, no lesions.   ORAL CAVITY: normal oropharynx, normal lips, mucosa moist, no lesions.   THROAT: normal appearance, clear, no erythema.   NECK/THYROID: neck supple, no carotid bruit, carotid pulse 2+ bilaterally, no cervical lymphadenopathy, no neck mass palpated, no jugular venous distention, no thyromegaly.   LYMPH NODES: no palpable adenopathy.   SKIN: good turgor, no rashes, no suspicious lesions.   HEART: S1,  S2 normal, no murmurs, rubs, gallops, regular rate and rhythm.   LUNGS: normal effort / no distress, normal breath sounds, clear to auscultation bilaterally, no wheezes, rales, rhonchi.   ABDOMEN: bowel sounds present, no hepatosplenomegaly, soft, nontender, nondistended.   RECTAL: not examined.   MALE GENITOURINARY: no penile lesions or discharge, no testicular mass.   MUSCULOSKELETAL: full range of motion, no swelling or deformity.   EXTREMITIES: no edema, no clubbing, cyanosis, or edema.   PERIPHERAL PULSES: 2+ dorsalis pedis, 2+ posterior tibial.   NEUROLOGIC: nonfocal, cranial nerves 2-12 grossly intact, deep tendon reflexes 2+ symmetrical, normal strength, tone and reflexes, sensory exam intact.   PSYCH: cognitive function  intact, mood/affect full range, speech clear.   Patient has Hydroceel . Will sent him to   Assessment/Plan:     1. Well adult exam  - CBC and differential; Future  - Hepatitis C (HCV) antibody, Total; Future  - Ambulatory referral to Gastroenterology    2. Proteinuria, unspecified type  - Ambulatory referral to Nephrology    3. Hydrocele, unspecified hydrocele type  - Ambulatory referral to Urology

## 2018-01-10 NOTE — Addendum Note (Signed)
Addended byZorita Pang on: 01/10/2018 10:29 AM     Modules accepted: Orders

## 2018-01-10 NOTE — Patient Instructions (Signed)
Recommend to continue to push exercise and stay on a good diet . Try to get to the perfect BMI of 25. Start your day with a high fiber diet and  involve the family in your exercise regiment .

## 2018-01-22 ENCOUNTER — Ambulatory Visit (INDEPENDENT_AMBULATORY_CARE_PROVIDER_SITE_OTHER): Payer: Self-pay

## 2018-01-24 ENCOUNTER — Other Ambulatory Visit: Payer: Commercial Managed Care - POS

## 2018-01-24 ENCOUNTER — Other Ambulatory Visit (FREE_STANDING_LABORATORY_FACILITY): Payer: Commercial Managed Care - POS

## 2018-01-24 DIAGNOSIS — R809 Proteinuria, unspecified: Secondary | ICD-10-CM

## 2018-01-24 DIAGNOSIS — Z0000 Encounter for general adult medical examination without abnormal findings: Secondary | ICD-10-CM

## 2018-01-24 LAB — PROTEIN / CREATININE RATIO, URINE
Urine Creatinine, Random: 159.6 mg/dL
Urine Protein Random: 197.3 mg/dL — ABNORMAL HIGH (ref 1.0–14.0)
Urine Protein/Creatinine Ratio: 1.2

## 2018-01-24 LAB — CBC AND DIFFERENTIAL
Absolute NRBC: 0 10*3/uL (ref 0.00–0.00)
Basophils Absolute Automated: 0.02 10*3/uL (ref 0.00–0.08)
Basophils Automated: 0.2 %
Eosinophils Absolute Automated: 0.18 10*3/uL (ref 0.00–0.44)
Eosinophils Automated: 2.1 %
Hematocrit: 42.7 % (ref 37.6–49.6)
Hgb: 14 g/dL (ref 12.5–17.1)
Immature Granulocytes Absolute: 0.02 10*3/uL (ref 0.00–0.07)
Immature Granulocytes: 0.2 %
Lymphocytes Absolute Automated: 4.54 10*3/uL — ABNORMAL HIGH (ref 0.42–3.22)
Lymphocytes Automated: 52.9 %
MCH: 32.4 pg (ref 25.1–33.5)
MCHC: 32.8 g/dL (ref 31.5–35.8)
MCV: 98.8 fL — ABNORMAL HIGH (ref 78.0–96.0)
MPV: 14.1 fL — ABNORMAL HIGH (ref 8.9–12.5)
Monocytes Absolute Automated: 0.39 10*3/uL (ref 0.21–0.85)
Monocytes: 4.5 %
Neutrophils Absolute: 3.43 10*3/uL (ref 1.10–6.33)
Neutrophils: 40.1 %
Nucleated RBC: 0 /100 WBC (ref 0.0–0.0)
Platelets: 151 10*3/uL (ref 142–346)
RBC: 4.32 10*6/uL (ref 4.20–5.90)
RDW: 16 % — ABNORMAL HIGH (ref 11–15)
WBC: 8.58 10*3/uL (ref 3.10–9.50)

## 2018-01-24 LAB — HEPATITIS C ANTIBODY: Hepatitis C, AB: NONREACTIVE

## 2018-01-26 LAB — VAHRT HISTORIC LVEF: Ejection Fraction: 15 %

## 2018-01-27 ENCOUNTER — Other Ambulatory Visit (FREE_STANDING_LABORATORY_FACILITY): Payer: Commercial Managed Care - POS

## 2018-01-27 ENCOUNTER — Other Ambulatory Visit (INDEPENDENT_AMBULATORY_CARE_PROVIDER_SITE_OTHER): Payer: Self-pay | Admitting: Family Medicine

## 2018-01-27 ENCOUNTER — Ambulatory Visit (INDEPENDENT_AMBULATORY_CARE_PROVIDER_SITE_OTHER): Payer: Self-pay | Admitting: Cardiovascular Disease

## 2018-01-27 ENCOUNTER — Telehealth (INDEPENDENT_AMBULATORY_CARE_PROVIDER_SITE_OTHER): Payer: Self-pay | Admitting: Family Medicine

## 2018-01-27 DIAGNOSIS — R809 Proteinuria, unspecified: Secondary | ICD-10-CM

## 2018-01-27 LAB — URINALYSIS
Blood, UA: NEGATIVE
Glucose, UA: NEGATIVE
Leukocyte Esterase, UA: NEGATIVE
Nitrite, UA: NEGATIVE
Protein, UR: 100 — AB
Specific Gravity UA: 1.026 (ref 1.001–1.035)
Urine pH: 6 (ref 5.0–8.0)
Urobilinogen, UA: 1

## 2018-01-27 NOTE — Progress Notes (Signed)
Excellent results . All results are in normal range, except minor abnormalities . Please continue current treatment and follow up as discussed with last visit .

## 2018-01-27 NOTE — Telephone Encounter (Signed)
Patient is in the [place] office stating that he got a call to come in to do UA ; no order entered in chart- please enter in patient chart.he is waiting.

## 2018-01-27 NOTE — Telephone Encounter (Signed)
Done

## 2018-01-28 LAB — URINE MICROSCOPIC

## 2018-01-28 LAB — PROTEIN / CREATININE RATIO, URINE
Urine Creatinine, Random: 267.8 mg/dL
Urine Protein Random: 75.8 mg/dL — ABNORMAL HIGH (ref 1.0–14.0)
Urine Protein/Creatinine Ratio: 0.3

## 2018-01-28 NOTE — Progress Notes (Signed)
Normal UA .No  abnormalities seen which would explain patient's symptoms  Please f/u if symptomes are not improving or getting worse. Recommend to discuss results with urologist.

## 2018-01-31 ENCOUNTER — Encounter (INDEPENDENT_AMBULATORY_CARE_PROVIDER_SITE_OTHER): Payer: Self-pay

## 2018-02-08 ENCOUNTER — Encounter (INDEPENDENT_AMBULATORY_CARE_PROVIDER_SITE_OTHER): Payer: Self-pay | Admitting: Family Medicine

## 2018-02-24 ENCOUNTER — Ambulatory Visit (INDEPENDENT_AMBULATORY_CARE_PROVIDER_SITE_OTHER): Payer: Self-pay | Admitting: Medical

## 2018-02-24 ENCOUNTER — Other Ambulatory Visit (INDEPENDENT_AMBULATORY_CARE_PROVIDER_SITE_OTHER): Payer: Self-pay | Admitting: Medical

## 2018-02-28 ENCOUNTER — Other Ambulatory Visit (INDEPENDENT_AMBULATORY_CARE_PROVIDER_SITE_OTHER): Payer: Self-pay | Admitting: Medical

## 2018-03-02 ENCOUNTER — Other Ambulatory Visit: Payer: Self-pay

## 2018-03-02 DIAGNOSIS — I5022 Chronic systolic (congestive) heart failure: Secondary | ICD-10-CM

## 2018-03-18 ENCOUNTER — Encounter (INDEPENDENT_AMBULATORY_CARE_PROVIDER_SITE_OTHER): Payer: Self-pay | Admitting: Family Medicine

## 2018-03-18 ENCOUNTER — Telehealth (INDEPENDENT_AMBULATORY_CARE_PROVIDER_SITE_OTHER): Payer: Self-pay | Admitting: Family Medicine

## 2018-03-18 NOTE — Telephone Encounter (Signed)
Pt called asking for his med list to be sent to his gastro doctor, Dr Jens Som    Please fax to  [phone]

## 2018-03-21 NOTE — Telephone Encounter (Signed)
Med list faxed to Dr. Jens Som as requested

## 2018-03-28 ENCOUNTER — Telehealth (INDEPENDENT_AMBULATORY_CARE_PROVIDER_SITE_OTHER): Payer: Self-pay

## 2018-03-28 NOTE — Telephone Encounter (Signed)
Faxed over blood work to Dr Gretta Began office on 03/28/2018    Fax: [phone]

## 2018-03-29 ENCOUNTER — Telehealth (INDEPENDENT_AMBULATORY_CARE_PROVIDER_SITE_OTHER): Payer: Self-pay

## 2018-03-29 NOTE — Telephone Encounter (Signed)
Results was faxed again to Dr.Crenshaw at [phone].

## 2018-03-30 ENCOUNTER — Ambulatory Visit (INDEPENDENT_AMBULATORY_CARE_PROVIDER_SITE_OTHER): Payer: Self-pay | Admitting: Medical

## 2018-03-30 ENCOUNTER — Encounter: Payer: Self-pay | Admitting: Gastroenterology

## 2018-03-31 ENCOUNTER — Telehealth: Payer: Commercial Managed Care - POS

## 2018-03-31 NOTE — Pre-Procedure Instructions (Addendum)
PSSRNPATIENTINSTRUCTIONSADULT    PACEMAKER/ICD FOLLOWED BY [facility]    EF 20 % PER ECHO 09-20-2017    Date of Procedure _June 17___.    Arrival Time is ___1.5  hours prior to surgical time which is currently scheduled for _10;30 ARRIVE 0900___.  Preop will call you after 4pm the business day prior to your scheduled procedure to verify this time, time can change up until this time.    Eating and Drinking Instructions for day of surgery-  If time changes - clear liquids 4 hours prior to arrival time.  CLEAR LIQUIDS INCLUDE:WATER, BLACK COFFEE/TEA (NO MILK, CREAM, NON-DAIRY CREAMER OR SUGAR), CARBONATED BEVERAGES, JUICES WITHOUT PULP (APPLE,), GATORADE    NPO(NO SOLIDS) PER SURGEONS INSTRUCTIONS , CLEAR FLUIDS TILL 0500 June 17    Special Medication Instructions from Anesthesiologist     ALPRAZolam (XANAX) 0.25 MG tablet  Take 0.25 mg by mouth nightly as needed  Last Dose: Taking  Note written 03/31/2018 1450: TAKE ASNEEDED    amitriptyline (ELAVIL) 25 MG tablet  amitriptyline 25 mg tablet  Take 1 tablet every EVENING by oral route. Informant: Self, Last Dose: Taking  Note written 03/31/2018 1452: TAKE AS USUAL    aspirin EC 81 MG EC tablet  Take 81 mg by mouth daily  Last Dose: Taking  Note written 03/31/2018 1455: PT WILL CALL [facility] TO SEE IF HE CAN STOP THISPRE SURGERY     atorvastatin (LIPITOR) 40 MG tablet  Take 1 tablet (40 mg total) by mouth nightly., Starting Mon 08/30/2017, Print  Patient taking differently: 40 mg Oral Every morning, Informant: Self, Reported on 03/31/2018, Last Dose: Taking  Note written 03/31/2018 1441: Take morning of surgery     budesonide-formoterol (SYMBICORT) 80-4.5 MCG/ACT inhaler  Symbicort 80 mcg-4.5 mcg/actuation HFA aerosol inhaler  INHALE 2 PUFFS PO BID. Last Dose: Taking  Note written 03/31/2018 1443: USE MORNING OF SURGERY    carvedilol (COREG) 25 MG tablet  Take 1 tablet (25 mg total) by mouth 2 (two) times daily with meals., Starting Fri 10/01/2017, Normal Last Dose:  Taking   TAKE MORNING OF SURGERY     docusate sodium (COLACE) 100 MG capsule  Take 1 capsule (100 mg total) by mouth 2 (two) times daily., Starting Tue 09/21/2017, OTC Last Dose: Taking  Note written 03/31/2018 1459: HOLD ON MORNING OF SURGERY     fluticasone furoate-vilanterol (BREO ELLIPTA) 100-25 MCG/INH Aerosol Powder, Breath Activtivatede  Inhale 2 puffs into the lungs as needed  Last Dose: Taking  Note written 03/31/2018 1443: MAY USE AS NEEDED     furosemide (LASIX) 40 MG tablet  Take 2 tablets (80 mg total) by mouth 2 (two) times daily., Starting Wed 12/01/2017, Normal Last Dose: Taking  HOLD MORNING OF SURGERY     insulin glargine (LANTUS) 100 UNIT/ML injection  Inject 45 Units into the skin nightly., Starting Tue 09/21/2017, Print  Patient taking differently: 100 Units Subcutaneous At bedtime, Reported on 03/31/2018 Last Dose: Taking  Note written 03/31/2018 1447: TAKE 40 UNITS THE EVENING PRE SURGERY PER DR GARG INSTRUCTIONS TO PT ON June 14    JANUMET 50-1000 MG tablet  TAKE 1 TABLET BY MOUTH TWICE DAILY WITH FOOD., Normal Last Dose: Taking  Note written 03/31/2018 1453: HOLD MORNING OF SURGERY     lisinopril (PRINIVIL,ZESTRIL) 10 MG tablet  Take 1 tablet (10 mg total) by mouth daily., Starting Wed 09/22/2017, Print  Patient taking differently: 10 mg Oral Every morning, Reported on 03/31/2018 Last Dose: Taking  Note written 03/31/2018 1451: HOLD MORNING OF SURGERY     niacin (NIASPAN) 500 MG CR tablet  Take 1 tablet (500 mg total) by mouth 2 (two) times daily.niacin ER 500 mg tablet,extended release 24 hr, Starting Tue 10/05/2017, Normal Last Dose: Taking  Note written 03/31/2018 1448: TAKE POST OP DAY OF SURGERY    polyethylene glycol (MIRALAX) packet  Take 17 g by mouth daily as needed (constipation)., Starting Tue 09/21/2017, Print  Patient taking differently: 17 g Oral Every morning, Reported on 03/31/2018 Last Dose: Taking  Note written 03/31/2018 1500: HOLD MORNING OF SURGERY    sacubitril-valsartan (ENTRESTO)  97-103 MG Tab per tablet  Take 1 tablet by mouth 2 (two) times daily  Last Dose: Taking  Note written 03/31/2018 1445: HOLD MORNING OF SURGERY    tamsulosin (FLOMAX) 0.4 MG Cap  Take 1 capsule (0.4 mg total) by mouth Daily after dinner., Starting Wed 12/01/2017, Normal Last Dose: Taking  Note written 03/31/2018 1449: TAKE AS USUAL       . Follow eating and drinking restrictions as instructed by surgeon and/or pre surgical services nurse.  . If applicable, follow your surgeon's bowel prep instructions.  . Failure to do so may result in cancellation of your procedure.  . Follow surgeons instructions if they gave specific medication instructions.      Hospital Address and Arrival Instructions:   [address], [place], [place] [address]  [facility] is where you will enter.  A complimentary valet is available at the front [facility] [place] entrance of the hospital.  Please call [phone] morning of surgery if you need assistance upon arrival.    Upon arrival in the hospital main lobby via [employer], you will proceed to:  1) Patient Registration, which is all the way down the hall on the left.  2) Once registration is completed, you will be escorted by the registration staff to the Surgical Services Waiting Area where you wait until a PreOp clinical team member escorts you to a room and initiates the PreOp process.  3) You will need to have a valid photo I.D.,  insurance card and co-pay form of payment if required by insurance.    Pre Surgical General Instructions    1. Bathe or shower the morning of the procedure with an anti-bacterial soap before arriving (unless instructed to use hibiclens).  2. Do not apply lotion, perfume, cologne, or hair-care products such as hair spray or gels.  3. Do not shave your surgical site at home.  4. Do not wear makeup, jewelry including body piercing, watches, earrings, or rings.   5. You may brush your teeth and gargle on the morning of surgery but do not swallow any  water.  6. Wear casual, loose fitting and comfortable clothing. A gown will be provided.  7. Plan to leave unnecessary valuables, credit cards (except for co-pay payment use) and jewelry at    home or with a companion on day of surgery for safe keeping. The hospital is not responsible for lost/stolen items.  8. If you wear contacts please leave them at home. If you wear glasses, please bring a case.  9. Dentures - we will provide a container  10. Hearing aids, you may bring dos but you will be asked to remove them before surgery.  11. Please arrange for someone to drive you home. For your safety you will not be allowed to drive home   after sedation or anesthesia. A responsible adult must be present  to accompany you home when you are ready to leave. We strongly recommend that all patients have an adult at home with them for the first 24 hours after surgery.  12. Notify your doctor if you develop any sign of illness before the date of your surgery. Report                 symptoms such as: high fever, sore throat, or other infection, breathing difficulties or chest pain.  13. Discontinue herbal supplements and herbal/green tea one week prior to surgery.      Below is a link/web address  to the Preparing for Your Procedure video that walks you through the surgical experience.   [email or web address]    Possible Anesthesia Side Effects  . Nausea and vomiting. This common side effect usually occurs immediately after the procedure, but some people may continue to feel sick for a day or two. Anti-nausea medicines can help.  . Dry mouth. You may feel parched when you wake up. As long as you're not too nauseated, sipping water can help take care of your dry mouth.  . Sore throat or hoarseness. The tube put in your throat to help you breathe during surgery can leave you with a sore throat after it's removed.  . Chills and shivering. It's common for your body temperature to drop during general anesthesia.  Your doctors and nurses will make sure your temperature doesn't fall too much during surgery, but you may wake up shivering and feeling cold. Your chills may last for a few minutes to hours.  . Confusion and fuzzy thinking. When first waking from anesthesia, you may feel confused, drowsy, and foggy. This usually lasts for just a few hours, but for some people - especially older adults - confusion can last for days or weeks.  . Muscle aches. The drugs used to relax your muscles during surgery can cause soreness afterward.  . Itching. If narcotic (opioid) medications are used during or after your operation, you may be itchy. This is a common side effect of this class of drugs.  . Bladder problems. You may have difficulty passing urine for a short time after general anesthesia.  . Dizziness. You may feel dizzy when you first stand up. Drinking plenty of fluids should help you feel better.

## 2018-04-01 ENCOUNTER — Encounter (INDEPENDENT_AMBULATORY_CARE_PROVIDER_SITE_OTHER): Payer: Commercial Managed Care - POS

## 2018-04-01 ENCOUNTER — Telehealth (INDEPENDENT_AMBULATORY_CARE_PROVIDER_SITE_OTHER): Payer: Self-pay | Admitting: Internal Medicine

## 2018-04-01 ENCOUNTER — Ambulatory Visit (INDEPENDENT_AMBULATORY_CARE_PROVIDER_SITE_OTHER): Payer: Commercial Managed Care - POS | Admitting: Internal Medicine

## 2018-04-01 ENCOUNTER — Encounter (INDEPENDENT_AMBULATORY_CARE_PROVIDER_SITE_OTHER): Payer: Self-pay | Admitting: Internal Medicine

## 2018-04-01 ENCOUNTER — Encounter (INDEPENDENT_AMBULATORY_CARE_PROVIDER_SITE_OTHER): Payer: Self-pay | Admitting: Family Medicine

## 2018-04-01 ENCOUNTER — Ambulatory Visit (INDEPENDENT_AMBULATORY_CARE_PROVIDER_SITE_OTHER): Payer: Self-pay

## 2018-04-01 VITALS — BP 93/62 | HR 71 | Temp 97.5°F | Resp 14 | Wt 199.8 lb

## 2018-04-01 DIAGNOSIS — E119 Type 2 diabetes mellitus without complications: Secondary | ICD-10-CM

## 2018-04-01 DIAGNOSIS — I10 Essential (primary) hypertension: Secondary | ICD-10-CM

## 2018-04-01 DIAGNOSIS — Z01818 Encounter for other preprocedural examination: Secondary | ICD-10-CM

## 2018-04-01 DIAGNOSIS — Z794 Long term (current) use of insulin: Secondary | ICD-10-CM

## 2018-04-01 NOTE — Progress Notes (Signed)
Date: 04/01/2018 1:31 PM   Patient ID: [name] [name] is a 72 y.o. male.         Have you seen any specialists/other providers since your last visit with [place]?    Yes - cardiologist, GI, pulmonary (Dr. Herbie Saxon)    Arm preference verified?   No    The patient is due for depression screening, shingles vaccine and foot exam, opthalmology exam    Subjective:     Chief Complaint:  Chief Complaint   Patient presents with   . Pre-op Exam     EKG in chart from 03/31/18.     Blood sugar this morning was 135; 86 yesterday morning.    HPI:    Patient of Dr. Altamease Oiler  Visit Type: Pre-operative Evaluation  Procedure: colonoscopy and EGD  Date of Surgery: 04/04/2018  Surgeon: Dr. Flossie Buffy  Fax Number (Required): [phone]  Reason for Surgery: wheezing  Recent Health (admits): cough  Recent Health (denies): no current complaints  ---------------------------------------  Exercise Tolerance: 5 met ( i.e. dancing )  Surgical Risk Factors: No active cardiac or  pulmonary conditions  Prior Anesthesia: Patient reports no adverse reaction to anesthesia in the past.       PATIENT CURRENTLY TAKING 50 UNITS AND NOT 100 UNITS OF INSULIN AT NIGHT .  ADVISED TO TAKE 40 UNITS NIGHT BEFORE SURGERY     Appointment on 01/27/2018   Component Date Value Ref Range Status   . Urine Type 01/27/2018 Clean Catch   Final   . Color, UA 01/27/2018 DK YELLOW* Clear - Yellow Final   . Clarity, UA 01/27/2018 CLOUDY* Clear - Hazy Final   . Specific Gravity UA 01/27/2018 1.026  1.001 - 1.035 Final   . Urine pH 01/27/2018 6.0  5.0 - 8.0 Final   . Leukocyte Esterase, UA 01/27/2018 NEGATIVE  Negative Final   . Nitrite, UA 01/27/2018 NEGATIVE  Negative Final   . Protein, UR 01/27/2018 100* Negative Final   . Glucose, UA 01/27/2018 NEGATIVE  Negative Final   . Ketones UA 01/27/2018 TRACE  Negative Final   . Urobilinogen, UA 01/27/2018 1.0  0.2 - 2.0 Final   . Bilirubin, UA 01/27/2018 SMALL* Negative Final    Comment: Urine Bilirubin may be falsely positive due  to interfering  substances and/or abnormal urine color. If indicated,  recommend correlation with serum bilirubin.     . Blood, UA 01/27/2018 NEGATIVE  Negative Final   . Urine Protein Random 01/27/2018 75.8* 1.0 - 14.0 mg/dL Final   . Urine Creatinine, Random 01/27/2018 267.8  None Established mg/dL Final   . Urine Protein/Creatinine Ratio 01/27/2018 0.3   Final   . RBC, UA 01/27/2018 3 - 5  0 - 5 /hpf Final    Comment: Some specialty practices may define 3-5 RBC/hpf as  microscopic hematuria.     . WBC, UA 01/27/2018 0-5  0 - 5 /hpf Final   . Squamous Epithelial Cells, Urine 01/27/2018 0-5  0 - 25 /hpf Final   . Urine Bacteria 01/27/2018 NONE  Rare /hpf Final   . Sperm, UA 01/27/2018 Present* None Final   . Hyaline Casts, UA 01/27/2018 0-5  0 - 5 /lpf Final       Problem List:  Patient Active Problem List   Diagnosis   . Chest pain   . Type 2 diabetes mellitus without complication, with long-term current use of insulin   . Heart attack   . CAD (coronary artery disease)   .  Non-ischemic cardiomyopathy   . Acute exacerbation of CHF (congestive heart failure)   . PUD (peptic ulcer disease)   . Elevated LFTs   . Acute dyspnea   . Elevated d-dimer   . CHF (congestive heart failure)   . Headaches due to old head injury   . Essential hypertension   . Pneumonia due to infectious organism, unspecified laterality, unspecified part of lung   . Syncope and collapse   . AICD (automatic cardioverter/defibrillator) present   . Syncope, unspecified syncope type   . Pneumonia due to infectious organism   . Dyspnea, unspecified type   . Cardiomyopathy   . Cough   . Sepsis   . AKI (acute kidney injury)   . Sepsis, due to unspecified organism   . SOB (shortness of breath)   . Acute on chronic systolic CHF (congestive heart failure)       Current Medications:  Current Outpatient Prescriptions   Medication Sig Dispense Refill   . ALPRAZolam (XANAX) 0.25 MG tablet Take 0.25 mg by mouth nightly as needed     . amitriptyline (ELAVIL) 25  MG tablet amitriptyline 25 mg tablet   Take 1 tablet every EVENING by oral route.     Marland Kitchen aspirin EC 81 MG EC tablet Take 81 mg by mouth daily     . atorvastatin (LIPITOR) 40 MG tablet Take 1 tablet (40 mg total) by mouth nightly. (Patient taking differently: Take 40 mg by mouth every morning    ) 30 tablet 0   . budesonide-formoterol (SYMBICORT) 80-4.5 MCG/ACT inhaler Symbicort 80 mcg-4.5 mcg/actuation HFA aerosol inhaler   INHALE 2 PUFFS PO BID.     Marland Kitchen carvedilol (COREG) 25 MG tablet Take 1 tablet (25 mg total) by mouth 2 (two) times daily with meals. 180 tablet 3   . docusate sodium (COLACE) 100 MG capsule Take 1 capsule (100 mg total) by mouth 2 (two) times daily.     . fluticasone furoate-vilanterol (BREO ELLIPTA) 100-25 MCG/INH Aerosol Powder, Breath Activtivatede Inhale 2 puffs into the lungs as needed     . furosemide (LASIX) 40 MG tablet Take 2 tablets (80 mg total) by mouth 2 (two) times daily. 60 tablet 2   . glucose blood (ONE TOUCH ULTRA TEST) test strip 1 each by Other route 2 (two) times daily.Use as instructed 300 each 1   . insulin glargine (LANTUS) 100 UNIT/ML injection Inject 45 Units into the skin nightly. (Patient taking differently: Inject 50 Units into the skin nightly    ) 10 mL 0   . JANUMET 50-1000 MG tablet TAKE 1 TABLET BY MOUTH TWICE DAILY WITH FOOD. 180 tablet 3   . lisinopril (PRINIVIL,ZESTRIL) 10 MG tablet Take 1 tablet (10 mg total) by mouth daily. (Patient taking differently: Take 10 mg by mouth every morning    ) 30 tablet 0   . niacin (NIASPAN) 500 MG CR tablet Take 1 tablet (500 mg total) by mouth 2 (two) times daily.niacin ER 500 mg tablet,extended release 24 hr 180 tablet 3   . polyethylene glycol (MIRALAX) packet Take 17 g by mouth daily as needed (constipation). (Patient taking differently: Take 17 g by mouth every morning    ) 24 each 0   . sacubitril-valsartan (ENTRESTO) 97-103 MG Tab per tablet Take 1 tablet by mouth 2 (two) times daily     . tamsulosin (FLOMAX) 0.4 MG Cap  Take 1 capsule (0.4 mg total) by mouth Daily after dinner. 90 capsule 3  No current facility-administered medications for this visit.        Allergies:  No Known Allergies    Past Medical History:  Past Medical History:   Diagnosis Date   . Acute CHF     NOV  & DEC 2018, - DIALYSIS CATH PLACED Aug 24 2017 TO REMOVE FLUID    . Anxiety    . CHF (congestive heart failure) 08/12/2014, 2013   . Chronic obstructive pulmonary disease     POSSIBLE PER PT HE USES  SYNBICORT BID ABD  BREO INHALER PRN   . Coronary artery disease 2011    CORONARY STENT PLACEMENT FOLLOWED BY [facility]   . Depression    . Heart attack 2011    and 2014   . Hyperlipemia    . Hypertension    . ICD (implantable 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     PLACED 2014  [facility]  LAST INTERROGATION 09-29-2017   . Ischemic cardiomyopathy     EF 15% ON ECHO 09-20-2017 DR Zion Lint [place] [facility]   . Pacemaker 2014    [employer] ICD/PACEMAKER COMBO LAST INTERROGATION  12-12 2018   . Pneumonia 09/2016   . Primary cardiomyopathy     Ischemic cardiomyopathy EF 15% ON ECHO 09-20-2017 DR Tanylah Schnoebelen [place] [facility]   . Syncope and collapse     PRIOR TO ICD/PACEMAKER PLACED 2014   . Type 2 diabetes mellitus, controlled DX 1998    BS  AVG 100  A1C 7.4 Dec 27 2017   . Wheeze     PT SEE HIS PMD April 01 2018 RE THIS-TOLD TO SEE PMD BY [facility] JUNE 12 WHEN THEY SAW HIM FOR A CL       Past Surgical History:  Past Surgical History:   Procedure Laterality Date   . CARDIAC CATHETERIZATION  2011    CORONARY STENT PLACEMENT X 2 PER PT   . CARDIAC DEFIBRILLATOR PLACEMENT  2014    [facility]   . CARDIAC PACEMAKER PLACEMENT  2014    [employer] ICD/PACEMAKER PLACED [facility]   . CIRCUMCISION  AGE 34   . COLONOSCOPY  2009   . duodenal ulcer  1973    STRESS RELATED IN SCHOOL   . EGD N/A 08/15/2014    Procedure: EGD;  Surgeon: Colon Branch, MD;  Location: [facility];  Service: Gastroenterology;  Laterality: N/A;  egd w/ bx   . EGD  1975   . FRACTURE SURGERY  AGE 79     RT  ANKLE- CAST APPLIED   . HERNIA  REPAIR  AGE 34    LEFT INGUINAL HERNIA   . orthopedic surgery  AGE 25    RT foot corrective - HAMMERTOE   . TONSILLECTOMY AND ADENOIDECTOMY  1954       Family History:  Family History   Problem Relation Age of Onset   . Breast cancer Mother    . Heart attack Mother 100   . Hypertension Mother    . Diabetes Mother    . Diabetes Brother    . Heart attack Father    . Hypertension Father        Social History:  Social History     Social History   . Marital status: Married     Spouse name: Lajoyce Corners   . Number of children: 0   . Years of education: N/A     Occupational History   . teacher [employer]  Social History Main Topics   . Smoking status: Never Smoker   . Smokeless tobacco: Never Used   . Alcohol use No   . Drug use: No   . Sexual activity: Yes     Partners: Female     Other Topics Concern   . Not on file     Social History Narrative   . No narrative on file       The following sections were reviewed this encounter by the provider:   Tobacco  Allergies  Meds  Problems  Med Hx  Surg Hx  Fam Hx  Soc Hx          Vitals:  BP 93/62   Pulse 71   Temp 97.5 F (36.4 C) (Oral)   Resp 14   Wt 90.6 kg (199 lb 12.8 oz)   SpO2 96%   BMI 28.67 kg/m      General/Constitutional:   Denies Chills. Denies Fever. Denies Wt loss.  Ophthalmologic:   Denies Blurred vision.   ENT:   Denies Nasal Discharge. Denies Sinus pain. Denies Sore throat.   Respiratory:   Denies Cough. Denies Shortness of breath. Denies Wheezing.   Cardiovascular:   Denies Chest pain. Denies Chest pain with exertion. Denies Palpitations. Denies  Swelling in hands/feet.   Gastrointestinal:   Denies Abdominal pain. Denies Constipation. Denies Diarrhea. Denies Nausea. Denies   Vomiting.   Skin:   Denies Rash.   Neurologic:   Denies Dizziness.  Denies Tingling/Numbness.   Psych:  Denies anxiety /depression/SI/HI          Objective:     Examination:   General Examination:  GENERAL APPEARANCE: alert, in no acute distress, well developed, well  nourished, oriented to time, place, and person.   HEAD: normal appearance.   EYES: extraocular movement intact (EOMI), pupils equal, round, reactive to light and accommodation, sclera anicteric.   EARS: tympanic membranes normal bilaterally, external canals normal .   NOSE: normal nasal mucosa, nares patent.   ORAL CAVITY: normal oropharynx.   THROAT: no erythema, no exudate.   NECK/THYROID: neck supple, carotid pulse 2+ bilaterally, no lymphadenopathy, no thyromegaly.   SKIN: no rashes.   HEART: S1, S2 normal, no murmurs, rubs, gallops, regular rate and rhythm.   LUNGS: normal effort / no distress, normal breath sounds, clear to auscultation bilaterally, no wheezes, rales, rhonchi.   ABDOMEN: bowel sounds present, no hepatosplenomegaly, soft, nontender, nondistended.   EXTREMITIES: no clubbing, cyanosis, or edema.   PERIPHERAL PULSES: 2+ dorsalis pedis, 2+ posterior tibial.   NEUROLOGIC: nonfocal,  PSYCH: alert, oriented, cognitive function intact.       Assessment:     1. Preoperative examination    2. Type 2 diabetes mellitus without complication, with long-term current use of insulin  Patient currently taking 50 units of insulin at bedtime  Advised to cut to 80% that is 40 units night before surgery  Recommend decreasing the amount of carbohydrates (such as pasta, breads, cereals, rice, and other grains), added sugars (e.g., cakes, cookies, and beverages) and increasing fiber intake (fresh fruits, vegetable, oatmeal). Lifestyle modifications for weight loss such as aerobic exercise at least 30 minutes daily for atleast 4 days a week is important.      Lab Results   Component Value Date    HGBA1C 7.4 (H) 12/27/2017       3. Essential hypertension  Pt stable on current medication regimen, no concerns. Continue current medication      Plan:  Pre-Operative Evaluation:  Surgery Specific Risk:  Low (endoscopic, superficial, breast, opthalmologic, minor orthopedic)  There are no active cardiopulmonary symptoms.  Exercise tolerance is greater than 4 mets.  Perioperative Medication Recommendations:  NSAID's - Pt advised to stop NSAID at least numbers: 7 days prior to surgery.  Recommendations to stop aspirin per his cardiologist Dr. Eliseo Gum.  Patient saw him on Wednesday    -I would place this patient at no higher risk for the planned procedure than others in his/her age group and I would suggest no further testing prior to the planned procedure.     TOTAL TIME SPENT WITH PATIENT:  TIME SPENT COUNSELING/COORDINATING CARE: more than half the time was spent discussing diagnosis and treatment with the patient        Morrison Old, MD

## 2018-04-01 NOTE — Telephone Encounter (Signed)
Pre-op H+P was faxed to Dr. Jens Som at [phone].

## 2018-04-04 ENCOUNTER — Ambulatory Visit: Payer: Commercial Managed Care - POS | Admitting: Registered Nurse

## 2018-04-04 ENCOUNTER — Ambulatory Visit: Payer: Self-pay

## 2018-04-04 ENCOUNTER — Encounter
Admission: RE | Disposition: A | Discharge status: Home / Self Care | Payer: Self-pay | Source: Ambulatory Visit | Attending: Gastroenterology

## 2018-04-04 ENCOUNTER — Ambulatory Visit
Admission: RE | Admit: 2018-04-04 | Discharge: 2018-04-04 | Disposition: A | Discharge status: Home / Self Care | Payer: Commercial Managed Care - POS | Source: Ambulatory Visit | Attending: Gastroenterology | Admitting: Gastroenterology

## 2018-04-04 DIAGNOSIS — Z95810 Presence of automatic (implantable) cardiac defibrillator: Secondary | ICD-10-CM | POA: Insufficient documentation

## 2018-04-04 DIAGNOSIS — D122 Benign neoplasm of ascending colon: Secondary | ICD-10-CM

## 2018-04-04 DIAGNOSIS — D123 Benign neoplasm of transverse colon: Secondary | ICD-10-CM | POA: Insufficient documentation

## 2018-04-04 DIAGNOSIS — E785 Hyperlipidemia, unspecified: Secondary | ICD-10-CM | POA: Insufficient documentation

## 2018-04-04 DIAGNOSIS — K3189 Other diseases of stomach and duodenum: Secondary | ICD-10-CM | POA: Insufficient documentation

## 2018-04-04 DIAGNOSIS — Z8711 Personal history of peptic ulcer disease: Secondary | ICD-10-CM | POA: Insufficient documentation

## 2018-04-04 DIAGNOSIS — Z860100 Personal history of colon polyps, unspecified: Secondary | ICD-10-CM

## 2018-04-04 DIAGNOSIS — K2950 Unspecified chronic gastritis without bleeding: Secondary | ICD-10-CM | POA: Insufficient documentation

## 2018-04-04 DIAGNOSIS — E119 Type 2 diabetes mellitus without complications: Secondary | ICD-10-CM | POA: Insufficient documentation

## 2018-04-04 DIAGNOSIS — K219 Gastro-esophageal reflux disease without esophagitis: Secondary | ICD-10-CM | POA: Insufficient documentation

## 2018-04-04 DIAGNOSIS — J449 Chronic obstructive pulmonary disease, unspecified: Secondary | ICD-10-CM | POA: Insufficient documentation

## 2018-04-04 DIAGNOSIS — K635 Polyp of colon: Secondary | ICD-10-CM | POA: Insufficient documentation

## 2018-04-04 DIAGNOSIS — K2980 Duodenitis without bleeding: Secondary | ICD-10-CM | POA: Insufficient documentation

## 2018-04-04 DIAGNOSIS — D132 Benign neoplasm of duodenum: Secondary | ICD-10-CM | POA: Insufficient documentation

## 2018-04-04 DIAGNOSIS — I252 Old myocardial infarction: Secondary | ICD-10-CM | POA: Insufficient documentation

## 2018-04-04 DIAGNOSIS — I110 Hypertensive heart disease with heart failure: Secondary | ICD-10-CM | POA: Insufficient documentation

## 2018-04-04 DIAGNOSIS — I2510 Atherosclerotic heart disease of native coronary artery without angina pectoris: Secondary | ICD-10-CM | POA: Insufficient documentation

## 2018-04-04 DIAGNOSIS — D120 Benign neoplasm of cecum: Secondary | ICD-10-CM | POA: Insufficient documentation

## 2018-04-04 DIAGNOSIS — Z955 Presence of coronary angioplasty implant and graft: Secondary | ICD-10-CM | POA: Insufficient documentation

## 2018-04-04 DIAGNOSIS — I509 Heart failure, unspecified: Secondary | ICD-10-CM | POA: Insufficient documentation

## 2018-04-04 DIAGNOSIS — Z794 Long term (current) use of insulin: Secondary | ICD-10-CM | POA: Insufficient documentation

## 2018-04-04 DIAGNOSIS — Z8249 Family history of ischemic heart disease and other diseases of the circulatory system: Secondary | ICD-10-CM | POA: Insufficient documentation

## 2018-04-04 DIAGNOSIS — I255 Ischemic cardiomyopathy: Secondary | ICD-10-CM | POA: Insufficient documentation

## 2018-04-04 DIAGNOSIS — D125 Benign neoplasm of sigmoid colon: Secondary | ICD-10-CM | POA: Insufficient documentation

## 2018-04-04 DIAGNOSIS — B9681 Helicobacter pylori [H. pylori] as the cause of diseases classified elsewhere: Secondary | ICD-10-CM

## 2018-04-04 HISTORY — DX: Chronic obstructive pulmonary disease, unspecified: J44.9

## 2018-04-04 HISTORY — DX: Cardiomyopathy, unspecified: I42.9

## 2018-04-04 HISTORY — DX: Wheezing: R06.2

## 2018-04-04 HISTORY — DX: Anxiety disorder, unspecified: F41.9

## 2018-04-04 HISTORY — PX: EGD, COLONOSCOPY: SHX3799

## 2018-04-04 HISTORY — DX: Type 2 diabetes mellitus without complications: E11.9

## 2018-04-04 HISTORY — DX: Depression, unspecified: F32.A

## 2018-04-04 HISTORY — DX: Heart failure, unspecified: I50.9

## 2018-04-04 HISTORY — DX: Syncope and collapse: R55

## 2018-04-04 HISTORY — DX: Presence of automatic (implantable) cardiac defibrillator: Z95.810

## 2018-04-04 LAB — GLUCOSE WHOLE BLOOD - POCT
Whole Blood Glucose POCT: 47 mg/dL — CL (ref 70–100)
Whole Blood Glucose POCT: 47 mg/dL — CL (ref 70–100)
Whole Blood Glucose POCT: 76 mg/dL (ref 70–100)
Whole Blood Glucose POCT: 73 mg/dL (ref 70–100)
Whole Blood Glucose POCT: 59 mg/dL — ABNORMAL LOW (ref 70–100)
Whole Blood Glucose POCT: 57 mg/dL — ABNORMAL LOW (ref 70–100)
Whole Blood Glucose POCT: 63 mg/dL — ABNORMAL LOW (ref 70–100)

## 2018-04-04 SURGERY — EGD, COLONOSCOPY
Anesthesia: Anesthesia General

## 2018-04-04 MED ORDER — PROPOFOL INFUSION 10 MG/ML
INTRAVENOUS | Status: DC | PRN
Start: 2018-04-04 — End: 2018-04-04
  Administered 2018-04-04: 100 ug/kg/min via INTRAVENOUS

## 2018-04-04 MED ORDER — PROPOFOL 10 MG/ML IV EMUL (WRAP)
INTRAVENOUS | Status: DC | PRN
Start: 2018-04-04 — End: 2018-04-04
  Administered 2018-04-04: 80 mg via INTRAVENOUS

## 2018-04-04 MED ORDER — EPHEDRINE SULFATE 50 MG/ML IJ/IV SOLN (WRAP)
Status: AC
Start: 2018-04-04 — End: ?
  Filled 2018-04-04: qty 1

## 2018-04-04 MED ORDER — VASOPRESSIN 20 UNIT/ML IV SOLN
INTRAVENOUS | Status: AC
Start: 2018-04-04 — End: 2018-04-04
  Filled 2018-04-04: qty 1

## 2018-04-04 MED ORDER — DEXTROSE 50 % IV SOLN
25.00 mL | Freq: Once | INTRAVENOUS | Status: DC
Start: 2018-04-04 — End: 2018-04-04

## 2018-04-04 MED ORDER — EPHEDRINE SULFATE 50 MG/ML IJ/IV SOLN (WRAP)
Status: DC | PRN
Start: 2018-04-04 — End: 2018-04-04
  Administered 2018-04-04: 10 mg via INTRAVENOUS
  Administered 2018-04-04: 5 mg via INTRAVENOUS
  Administered 2018-04-04: 10 mg via INTRAVENOUS
  Administered 2018-04-04: 5 mg via INTRAVENOUS

## 2018-04-04 MED ORDER — GLYCOPYRROLATE 0.2 MG/ML IJ SOLN
INTRAMUSCULAR | Status: DC | PRN
Start: 2018-04-04 — End: 2018-04-04
  Administered 2018-04-04: 0.1 mg via INTRAVENOUS

## 2018-04-04 MED ORDER — DEXTROSE 50 % IV SOLN
INTRAVENOUS | Status: AC
Start: 2018-04-04 — End: 2018-04-04
  Administered 2018-04-04: 25 mL
  Filled 2018-04-04: qty 50

## 2018-04-04 MED ORDER — LIDOCAINE HCL (PF) 2 % IJ SOLN
INTRAMUSCULAR | Status: AC
Start: 2018-04-04 — End: ?
  Filled 2018-04-04: qty 5

## 2018-04-04 MED ORDER — LACTATED RINGERS IV SOLN
INTRAVENOUS | Status: DC
Start: 2018-04-04 — End: 2018-04-04
  Administered 2018-04-04: 1000 mL via INTRAVENOUS

## 2018-04-04 MED ORDER — PROPOFOL 10 MG/ML IV EMUL (WRAP)
INTRAVENOUS | Status: AC
Start: 2018-04-04 — End: ?
  Filled 2018-04-04: qty 80

## 2018-04-04 MED ORDER — LIDOCAINE HCL 2 % IJ SOLN
INTRAMUSCULAR | Status: DC | PRN
Start: 2018-04-04 — End: 2018-04-04
  Administered 2018-04-04: 100 mg via INTRAVENOUS

## 2018-04-04 MED ORDER — LIDOCAINE 1% BUFFERED - CNR/OUTSOURCED
0.30 mL | Freq: Once | INTRAMUSCULAR | Status: DC
Start: 2018-04-04 — End: 2018-04-04

## 2018-04-04 MED ORDER — VASOPRESSIN 20 UNIT/ML IV SOLN
INTRAVENOUS | Status: DC | PRN
Start: 2018-04-04 — End: 2018-04-04
  Administered 2018-04-04: 4 [IU] via INTRAVENOUS
  Administered 2018-04-04: 2 [IU] via INTRAVENOUS
  Administered 2018-04-04 (×2): 4 [IU] via INTRAVENOUS
  Administered 2018-04-04: 2 [IU] via INTRAVENOUS

## 2018-04-04 MED ORDER — PHENYLEPHRINE 100 MCG/ML IN NACL 0.9% IV SOSY
PREFILLED_SYRINGE | INTRAVENOUS | Status: DC | PRN
Start: 2018-04-04 — End: 2018-04-04
  Administered 2018-04-04: 200 ug via INTRAVENOUS
  Administered 2018-04-04 (×4): 100 ug via INTRAVENOUS

## 2018-04-04 SURGICAL SUPPLY — 76 items
BALLOON CRE DILTR 12-15MMX8CM (Balloons)
BASIN EME PLS 700ML LF GRAD TRNLU PGMNT (Patient Supply) ×2
BASIN EMESIS 700 ML GRADUATED TRANSLUCENT PIGMENT FREE PLASTIC (Patient Supply) ×1 IMPLANT
BASIN EMESIS LF (Patient Supply) ×1
BITE BLOCK MAXI 60F LATEX FREE (Procedure Accessories) ×1
BLOCK BITE MAXI 60FR LF STRD STRAP SDPRT (Procedure Accessories) ×2
BLOCK BITE OD60 FR STURDY STRAP SIDEPORT (Procedure Accessories) ×1
BLOCK BITE OD60 FR STURDY STRAP SIDEPORT DENTAL RETENTION RIM MAXI (Procedure Accessories) IMPLANT
CANNISTER SUCTION 1500CC (Suction)
CATHETER BALLOON DILATATION CRE PEBAX (Balloons)
CATHETER OD6 FR ODSEC12-13.5-15 MM L180 CM CRE BALLOON DILATATION L8 (Balloons) IMPLANT
CATHETER OD6 FR ODSEC12-13.5-15 MM L180 CM CREâ„¢ BALLOON DILATATION L8 (Balloons) IMPLANT
CLIP HEMOSTATIC L235 CM OD2.8 MM BRAID (Clip) ×2 IMPLANT
CLIP HEMOSTATIC L235 CM OD2.8 MM BRAID CATHETER ROTATION CONTROL KNOB (Clip) IMPLANT
CLIP HMST 11MM OPN RSL 360 2.8MM 235CM (Clip) ×4 IMPLANT
CLIP RESOLUTION 360 235CM (Clip) ×2 IMPLANT
CONTAINER HISTOLOGY 60 ML 30 ML GRADUATE LEAK RESISTANT O RING PREFILL (Procedure Accessories) IMPLANT
DILATOR ESCP PEBAX CRE 6FR 12-13.5-15MM (Balloons)
FORCEP HOT BIOPSY RJ4 (Endoscopic Supplies)
FORCEPS BIOPSY L240 CM +2.8 MM HOT OD2.2 (Endoscopic Supplies)
FORCEPS BIOPSY L240 CM +2.8 MM HOT OD2.2 MM RADIAL JAW (Endoscopic Supplies) IMPLANT
FORCEPS BIOPSY L240 CM JUMBO MICROMESH (Instrument)
FORCEPS BIOPSY L240 CM JUMBO MICROMESH TEETH STREAMLINE CATHETER (Instrument) IMPLANT
FORCEPS BIOPSY L240 CM LARGE CAPACITY (Instrument)
FORCEPS BIOPSY L240 CM MICROMESH TEETH STREAMLINE CATHETER NEEDLE (Instrument) IMPLANT
FORCEPS BIOPSY L240 CM STANDARD CAPACITY (Instrument)
FORCEPS BX +2.8MM RJ 4 2.2MM 240CM HOT (Endoscopic Supplies)
FORCEPS BX SS JMB RJ 4 2.8MM 240CM STRL (Instrument)
FORCEPS BX SS LG CPC RJ 4 2.4MM 240CM (Instrument)
FORCEPS BX STD CPC RJ 4 2.2MM 240CM STRL (Instrument)
FORCEPS JAW RADIAL JUMBO (Instrument)
FORCEPS RAD JAW 4 BIOSPY W/NDL (Instrument)
FORCEPS RADIAL JAW 3 W/ NEEDLE (Endoscopic Supplies) IMPLANT
FORCEPS RADIAL JAW 4 2.8MM (Instrument)
GOWN ISL SMS XL LF HKLP NK KNIT CUF BLU (Patient Supply) ×4
GOWN ISO YELLOW UNIVERSAL (Gown) ×2 IMPLANT
GOWN ISOLATION XL HOOK LOOP NECK KNIT (Patient Supply) IMPLANT
GOWN XL - NON STERILE (Patient Supply) ×2
JELLY LUB EZ LF STRL H2O SOL NGRS TRNLU (Irrigation Solutions) ×1 IMPLANT
JELLY LUBE STRL FLPTOP 4OZ (Irrigation Solutions)
KIT ENDO W/ FOUR PACK BUTTONS (Kits) ×3
KIT ENDOSCOPIC COMPLIANCE ENDOKIT (Kits) ×1
KIT ENDOSCOPIC COMPLIANCE ENDOKIT ORCAPOD 4 1.1 OZ (Kits) IMPLANT
KIT SMARTPREP APC 30 30ML (Kits) IMPLANT
LINER SCT 1500CC THNWL MDVC FLX ADV LF (Suction) IMPLANT
MARKER ENDOSCOPIC PERMANENT INDICATION (Syringes, Needles)
MARKER ENDOSCOPIC PERMANENT INDICATION DARK SYRINGE SPOT EX 5 ML (Syringes, Needles) ×1 IMPLANT
MARKER ESCP 5ML SPOT EX PERM INDCT DRK (Syringes, Needles)
NEEDLE INJC DISP NM LOWER GI (Needles) IMPLANT
NET SPEC RTRVL STD RTHNT 2.5MM 230CM LF (Urology Supply)
NET SPECIMEN RETRIEVAL L230 CM STANDARD (Urology Supply)
NET SPECIMEN RETRIEVAL L230 CM STANDARD SHEATH OD2.5 MM L6 CM X W3 CM (Urology Supply) IMPLANT
PAD ELECTROSRG GRND REM W CRD (Procedure Accessories) ×2 IMPLANT
RETRIEVER ROTH NET STD 2.5MM (Urology Supply)
SNARE CAPTIFLEX 27MM (Endoscopic Supplies)
SNARE CAPTIFLEX OVAL 13MM (Endoscopic Supplies) ×1
SNARE CAPTIVATOR 13MMX240CM (GE Lab Supplies)
SNARE ESCP MED OVL CPTFLX 2.4MM 240CM (Endoscopic Supplies)
SNARE ESCP MIC CPTVTR 13MM 240IN STRL (GE Lab Supplies)
SNARE ESCP SM OVL CPTFLX 2.4MM 240CM (Endoscopic Supplies) ×2
SNARE MD OVAL 240CM 2.4 MM CPTFLX LP FLXBL ENDOSCOPIC POLYPECTOMY 27MM (Endoscopic Supplies) IMPLANT
SNARE MEDIUM OVAL L240 CM OD2.4 MM (Endoscopic Supplies)
SNARE OD13 MM CAPTIFLEX ENDOSCOPIC POLYPECTOMY (Endoscopic Supplies) IMPLANT
SNARE SMALL HEXAGON CAPTIVATOR STIFF ENDOSCOPIC POLYPECTOMY (GE Lab Supplies) IMPLANT
SOL FORMALIN 10% PREFILL 30ML (Procedure Accessories)
SPEEDBAND SUPERVIEW SUPER (Endoscopic Supplies) IMPLANT
SPONGE GAUZE L4 IN X W4 IN 4 PLY HIGH (Sponge)
SPONGE GAUZE L4 IN X W4 IN 4 PLY NONWOVEN LINT FREE CURITY RAYON (Sponge) ×1 IMPLANT
SPONGE GAUZE VERSA 4PLY 4X4 (Sponge)
SPONGE GZE RYN PLSTR CRTY 4X4IN LF NS 4 (Sponge)
SYRING SPOT EX ENDO MARK 5CC (Syringes, Needles)
SYRINGE 50 ML GRADUATE NONPYROGENIC DEHP (Syringes, Needles) ×1
SYRINGE 50 ML GRADUATE NONPYROGENIC DEHP FREE PVC FREE BD MEDICAL (Syringes, Needles) ×1 IMPLANT
SYRINGE MED 50ML LF STRL GRAD N-PYRG (Syringes, Needles) ×2
SYRINGE SLIP-TIP 60CC (Syringes, Needles) ×1
TRAP SPECIMEN LF (Procedure Accessories) ×2 IMPLANT

## 2018-04-04 NOTE — PACU (Addendum)
1301 Tolerating ginger ale  1311 Blood sugar 59 Dr Daryll Brod notified, pt given apple juice  1313 Called wife x 3  no answer, not in waiting room, will try again later

## 2018-04-04 NOTE — PACU (Addendum)
Low BP, Dr. Thalia Bloodgood at bedside. Left message to wife.  1243 responds to name. Left message to wife's phone.  1330 more awake. Tolerating PO intake.  1408 Dr. Thalia Bloodgood aware of SBP and BG 57. Pt is awake and alert. Saturation drops to 88% when asleep on RA. Oxygen 2L via NC given> Anesthesia is aware. No orders made. May go home if patient "feels better" and ready to go home..  1425 patient met criteria for discharge. Wife on her way to pick up patient.  1456 wife at bedside.

## 2018-04-04 NOTE — Brief Op Note (Signed)
BRIEF OP NOTE  Full Note Dictated    Date Time: 04/04/18 12:15 PM    Patient Name:   [name] [name]    Date of Operation:   04/04/2018    Providers Performing:   Surgeon(s):  Jens Som, Laurian Brim, MD    Assistant (s):   Circulator: Lenetta Quaker, RN  Endo Tech: Charlcie Cradle    Operative Procedure:   Procedure(s):  EGD with bxs, COLONOSCOPY with polypectomy and clipping    Preoperative Diagnosis:   Pre-Op Diagnosis Codes:     * Gastroesophageal reflux disease, esophagitis presence not specified [K21.9]     * History of colon polyps [Z86.010]    Anesthesia:   IV Sedation    Estimated Blood Loss:   Minimal    Findings:   EGD: As per path report.  Colonoscopy: As per pathology report.  2 endoclips placed at polypectomy site of 1 of the cecal polyps removed.    Postoperative Diagnosis:   Above.    Complications:   None    Signed by: Doyne Keel, MD                                                                           [facility]

## 2018-04-04 NOTE — PACU (Signed)
Patient states readiness to be discharged to home. Patient states pain and nausea managed at this time. Patient and family verbalize understanding of discharge instructions, medications, plan of care for pain/nausea management, and activity at home and awareness of available resources to contact from home as necessary.   Plan to discharge to home with all belongings and instructions.    Family at bedsiode.

## 2018-04-04 NOTE — Anesthesia Postprocedure Evaluation (Signed)
Anesthesia Post Evaluation    Patient: [name] [name]    Procedure(s):  EGD with bxs, COLONOSCOPY with polypectomy and clipping    Anesthesia type: general    Last Vitals:   Vitals:    04/04/18 1212   BP: (!) 82/52   Pulse: 72   Resp: 15   SpO2: 95%       Anesthesia Post Evaluation:     Patient Evaluated: PACU  Patient Participation: complete - patient participated  Level of Consciousness: awake and alert  Pain Score: 0  Pain Management: adequate    Airway Patency: patent    Anesthetic complications: No      PONV Status: none    Cardiovascular status: acceptable  Respiratory status: acceptable  Hydration status: acceptable        Anesthesia Qualified Clinical Data Registry 2018    PACU Reintubation  Did the Patient have general anesthesia with intubation: No        PONV Adult  Is the patient aged 28 or older: Yes  Did the patient receive recieve a general anesthestic: Yes  Does the patient have 3 or more risk factors for PONV? Yes  Did the patient receive anti-emetics from at least two classes of medications? No  Was there a medical reason for not administering anti-emetics? Yes    PONV Pediatric  Is the patient aged 71-17? No            PACU Transfer Checklist Protocol  Was the patient transferred to the PACU at the conclusion of surgery? Yes  Was a checklist or transfer protocol used? Yes    ICU Transfer Checklist Protocol  Was the patient transferred to the ICU at the conclusion of surgery? No      Post-op Pain Assessment Prior to Anesthesia Care End  Age >=18 and assessed for pain in PACU: Yes  Pacu pain score <7/10: Yes      Perioperative Mortality  Perioperative mortality prior to Anesthesia end time: No    Perioperative Cardiac Arrest  Did the patient have an unanticipated intraoperative cardiac arrest between anesthesia start time and anesthesia end time? No    Unplanned Admission to ICU  Did the patient have an unplanned admission to the ICU (not initially anticipated at anesthesia start time)?  No      Signed by: Lorne Skeens Sicking, 04/04/2018 12:12 PM

## 2018-04-04 NOTE — Anesthesia Preprocedure Evaluation (Signed)
Anesthesia Evaluation    AIRWAY    Mallampati: I         CARDIOVASCULAR    cardiovascular exam normal       DENTAL    no notable dental hx     PULMONARY    pulmonary exam normal     OTHER FINDINGS                  Relevant Problems   (+) Acute dyspnea   (+) Acute exacerbation of CHF (congestive heart failure)   (+) CAD (coronary artery disease)   (+) CHF (congestive heart failure)   (+) Dyspnea, unspecified type   (+) Essential hypertension   (+) Headaches due to old head injury   (+) Heart attack   (+) PUD (peptic ulcer disease)   (+) Pneumonia due to infectious organism   (+) Pneumonia due to infectious organism, unspecified laterality, unspecified part of lung   (+) SOB (shortness of breath)   (+) Type 2 diabetes mellitus without complication, with long-term current use of insulin               Anesthesia Plan    ASA 3     general                     intravenous induction           Post op pain management: per surgeon    informed consent obtained    Plan discussed with CRNA.      pertinent labs reviewed             Signed by: Mindi Junker 04/04/18 9:37 AM

## 2018-04-04 NOTE — Discharge Instr - AVS First Page (Addendum)
Reason for your Hospital Admission:        Instructions for after your discharge:    You just had one of the following procedures performed by your physician.  The following findings apply to you if a box below is checked.    Post-Upper Endoscopy (EGD) Post-Colonoscopy   []  Normal    []  Barrett's Esophagus  []  Esophageal Irritation/Inflammation  []  Irregularity of Lining at the Junction of the     Esophagus to the Stomach  []  Narrowing/ring at or near Junction of the Esophagus to the Stomach    []  Hiatal Hernia   []  Stomach Ulcer  [x]  Stomach Irritation/Inflammation  []  Stomach Polyps  []  Stomach Vascular Malformations    [x]  Small Intestinal Irritation/Inflammation  []  Small Intestinal Ulcer  [x]  Small Intestinal Polyp(s)    [x]  Biopsies      []  Esophagus      [x]  Stomach        [x]  H. Pylori Biopsies      [x]  Small Intestine []  Normal  []  Hemorrhoids  [x]  Polyp(s) Removed:  9  []  Random Colon Biopsies  []  Diverticulosis  []  Colitis  []  Vascular Malformation     [x]  Other:  2 clips used to close one of the polyp removal sites in the beginning of the colon        The following instructions apply to you if a box below is checked.    1.   [x]  After your exam you may experience a mild sore throat.  This should resolve in 1-2 days.  You may use lozenges or gargle with warm salt water.   2.   [x]  After your exam you may experience some increased gas, bloating, or cramping.  If you experience any bleeding, abdominal pain, persistent nausea or vomiting, chest pain, difficulty breathing or swallowing, contact your physician or go to the emergency room immediately.  There may be some inflammation at the site where the intravenous medication was given. If this occurs, you may apply a warm towel. Call your physician if the condition worsens.   3.   [x]  You should begin with a clear liquid diet after the procedure.  Advance your diet as tolerated.  Most likely you can eat a regular diet tonight, unless otherwise instructed by  your physician.     4.   [x]  You should be able to resume normal activity (work, house chores, etc.) by tomorrow, unless otherwise instructed by your physician.  DO NOT make important decisions, sign any documents or drive for at least 24 hours.   5.   []  Your physician is recommending the following additional test/testing.     []   An order of this/these test(s) will be mailed to your home.  If you do not receive an order within one week of your procedure date, please contact your physician's office.      []   Please contact your physician's office for information on how to arrange for this/these test(s).      6.   [x]  You should resume all previous medications unless otherwise instructed.  If on blood thinners such as Plavix, Coumadin/Warfarin or Lovenox, please restart       [x]   Today           []  ____ day(s) after procedure.     7.   []  Please start new medication(s) or change your medication as follows:      8.   [x]  Please call the office to  make a follow up appointment:           Doyne Keel, MD         [address], [address]         [place], [place] [address]         [phone]           [email or web address]         [email or web address]       9.   [x]  Your results will be conveyed to you in one of the following ways:   []  A. At your follow up appointment with the Doctor who performed your procedure(s).      [x]  B. You will receive your results and recommended follow up within two weeks by letter or by a phone call from the Doctor's office.  Please call the doctor's office if you have not received your results within 2 weeks.   []  C. Call the office in ___ week(s) for results

## 2018-04-04 NOTE — Transfer of Care (Signed)
Anesthesia Transfer of Care Note    Patient: [name] [name]    Procedures performed: Procedure(s):  EGD with bxs, COLONOSCOPY with polypectomy and clipping    Anesthesia type: General TIVA    Patient location:Phase II PACU    Last vitals:   Vitals:    04/04/18 0916   BP: 107/58   Pulse: 62   Resp: 16   SpO2: 98%       Post pain: Patient not complaining of pain, continue current therapy      Mental Status:sedated    Respiratory Function: tolerating nasal cannula    Cardiovascular: stable    Nausea/Vomiting: patient not complaining of nausea or vomiting    Hydration Status: adequate    Post assessment: no apparent anesthetic complications and no reportable events    Signed by: Lorne Skeens Erionna Strum  04/04/18 12:11 PM

## 2018-04-04 NOTE — Consults (Signed)
Consults   ADMISSION HISTORY AND PHYSICAL EXAM    Date Time: 04/04/18 12:12 PM  Patient Name: [name] [name]  Attending Physician: Doyne Keel, MD      History of Present Illness:   [name] [name] is a 72 y.o. male who presents with:  Pre-Op Diagnosis Codes:     * Gastroesophageal reflux disease, esophagitis presence not specified [K21.9]     * History of colon polyps [Z86.010]    Past Medical History:     Past Medical History:   Diagnosis Date   . Acute CHF     NOV  & DEC 2018, - DIALYSIS CATH PLACED Aug 24 2017 TO REMOVE FLUID    . Anxiety    . CHF (congestive heart failure) 08/12/2014, 2013   . Chronic obstructive pulmonary disease     POSSIBLE PER PT HE USES  SYNBICORT BID ABD  BREO INHALER PRN   . Coronary artery disease 2011    CORONARY STENT PLACEMENT FOLLOWED BY [facility]   . Depression    . Heart attack 2011    and 2014   . Hyperlipemia    . Hypertension    . ICD (implantable 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     PLACED 2014  [facility]  LAST INTERROGATION April 01 2018 REPORT REQUESTED   . Ischemic cardiomyopathy     EF 15% ON ECHO 09-20-2017 DR GARG IN [facility]   . Pacemaker 2014    MEDTR[employer]IC ICD/PACEMAKER COMBO LAST INTERROGATION  12-12 2018   . Pneumonia 09/2016   . Primary cardiomyopathy     Ischemic cardiomyopathy EF 15% ON ECHO 09-20-2017 DR GARG IN [facility]   . Syncope and collapse     PRIOR TO ICD/PACEMAKER PLACED 2014   . Type 2 diabetes mellitus, controlled DX 1998    BS  AVG 100  A1C 7.4 Dec 27 2017   . Wheeze     PT SEE HIS PMD April 01 2018 RE THIS-TOLD TO SEE PMD BY [facility] JUNE 12 WHEN THEY SAW HIM FOR A CL       Past Surgical History:     Past Surgical History:   Procedure Laterality Date   . CARDIAC CATHETERIZATION  2011    CORONARY STENT PLACEMENT X 2 PER PT   . CARDIAC DEFIBRILLATOR PLACEMENT  2014    [facility]   . CARDIAC PACEMAKER PLACEMENT  2014    [employer]TR[employer] ICD/PACEMAKER PLACED [facility]   . CIRCUMCISION  AGE 3   . COLONOSCOPY  2009   . duodenal ulcer  1973    STRESS RELATED  IN SCHOOL   . EGD N/A 08/15/2014    Procedure: EGD;  Surgeon: Colon Branch, MD;  Location: [facility];  Service: Gastroenterology;  Laterality: N/A;  egd w/ bx   . EGD  1975   . FRACTURE SURGERY  AGE 5     RT  ANKLE- CAST APPLIED   . HERNIA REPAIR  AGE 3    LEFT INGUINAL HERNIA   . orthopedic surgery  AGE 61    RT foot corrective - HAMMERTOE   . TONSILLECTOMY AND ADENOIDECTOMY  1954       Family History:     Family History   Problem Relation Age of Onset   . Breast cancer Mother    . Heart attack Mother 59   . Hypertension Mother    . Diabetes Mother    . Diabetes Brother    . Heart attack  Father    . Hypertension Father        Social History:     Social History     Social History   . Marital status: Married     Spouse name: Lajoyce Corners   . Number of children: 0   . Years of education: N/A     Occupational History   . 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 Smoking status: Never Smoker   . Smokeless tobacco: Never Used   . Alcohol use No   . Drug use: No   . Sexual activity: Yes     Partners: Female     Other Topics Concern   . Not on file     Social History Narrative   . No narrative on file       Allergies:   No Known Allergies    Medications:     Prescriptions Prior to Admission   Medication Sig   . ALPRAZolam (XANAX) 0.25 MG tablet Take 0.25 mg by mouth nightly as needed   . amitriptyline (ELAVIL) 25 MG tablet amitriptyline 25 mg tablet   Take 1 tablet every EVENING by oral route.   Marland Kitchen aspirin EC 81 MG EC tablet Take 81 mg by mouth daily   . atorvastatin (LIPITOR) 40 MG tablet Take 1 tablet (40 mg total) by mouth nightly. (Patient taking differently: Take 40 mg by mouth every morning    )   . budesonide-formoterol (SYMBICORT) 80-4.5 MCG/ACT inhaler Symbicort 80 mcg-4.5 mcg/actuation HFA aerosol inhaler   INHALE 2 PUFFS PO BID.   Marland Kitchen carvedilol (COREG) 25 MG tablet Take 1 tablet (25 mg total) by mouth 2 (two) times daily with meals.   . docusate sodium (COLACE) 100 MG capsule Take 1  capsule (100 mg total) by mouth 2 (two) times daily.   . furosemide (LASIX) 40 MG tablet Take 2 tablets (80 mg total) by mouth 2 (two) times daily.   Marland Kitchen glucose blood (ONE TOUCH ULTRA TEST) test strip 1 each by Other route 2 (two) times daily.Use as instructed   . insulin glargine (LANTUS) 100 UNIT/ML injection Inject 45 Units into the skin nightly. (Patient taking differently: Inject 50 Units into the skin nightly    )   . JANUMET 50-1000 MG tablet TAKE 1 TABLET BY MOUTH TWICE DAILY WITH FOOD.   Marland Kitchen lisinopril (PRINIVIL,ZESTRIL) 10 MG tablet Take 1 tablet (10 mg total) by mouth daily. (Patient taking differently: Take 10 mg by mouth every morning    )   . niacin (NIASPAN) 500 MG CR tablet Take 1 tablet (500 mg total) by mouth 2 (two) times daily.niacin ER 500 mg tablet,extended release 24 hr   . polyethylene glycol (MIRALAX) packet Take 17 g by mouth daily as needed (constipation). (Patient taking differently: Take 17 g by mouth every morning    )   . sacubitril-valsartan (ENTRESTO) 97-103 MG Tab per tablet Take 1 tablet by mouth 2 (two) times daily   . tamsulosin (FLOMAX) 0.4 MG Cap Take 1 capsule (0.4 mg total) by mouth Daily after dinner.   . fluticasone furoate-vilanterol (BREO ELLIPTA) 100-25 MCG/INH Aerosol Powder, Breath Activtivatede Inhale 2 puffs into the lungs as needed       Review of Systems:   A comprehensive review of systems was: Negative except as above.    Physical Exam:     Vitals:    04/04/18 1212   BP: (!) 82/52  Pulse: 72   Resp: 15   SpO2: 95%       Intake and Output Summary (Last 24 hours) at Date Time    Intake/Output Summary (Last 24 hours) at 04/04/18 1212  Last data filed at 04/04/18 1146   Gross per 24 hour   Intake             2000 ml   Output                0 ml   Net             2000 ml       Mental status - alert, oriented to person, place, and time.  Skin:  No rash or significant lesions.  Ent-No icterus or lymphadenopathy  Chest - clear to auscultation.  No wheezes, rales or  rhonchi, symmetric air entry  Heart - normal rate, regular rhythm, normal S1, S2, no murmurs, rubs, clicks or gallops  Abdomen - soft, nontender, nondistended, no masses or organomegaly.  Back-no cpat.  Extremities - peripheral pulses normal, no pedal edema, no clubbing or cyanosis    Labs:     Results     Procedure Component Value Units Date/Time    Glucose Whole Blood - POCT [[id number]] Collected:  04/04/18 1015     Updated:  04/04/18 1018     POCT - Glucose Whole blood 76 mg/dL     Glucose Whole Blood - POCT [[id number]]  (Abnormal) Collected:  04/04/18 0929     Updated:  04/04/18 0933     POCT - Glucose Whole blood 47 (LL) mg/dL     Glucose Whole Blood - POCT [[id number]]  (Abnormal) Collected:  04/04/18 0931     Updated:  04/04/18 0933     POCT - Glucose Whole blood 47 (LL) mg/dL           Rads:     Radiology Results (24 Hour)     ** No results found for the last 24 hours. **          Assessment:   GERD: I am recommending to proceed with EGD to assess for complications associated with acid reflux.  Details and risks of the EGD including reaction to sedation, bleeding and perforation requiring surgery explained in detail.  Patient verbalized understanding of plan for consent to proceed.    Personal history of colon polyps: I am recommending to proceed with colonoscopy to evaluate for metachronous lesions.  Details and risks associated with colonoscopy including reaction to sedation, bleeding and perforation requiring surgery were explained in detail.  Patient verbalized an understanding and provided informed consent to proceed.    Plan:   As above.    Signed by: Doyne Keel

## 2018-04-05 NOTE — Op Note (Signed)
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  Antral gastropathy biopsied.  2.  Random biopsies from the gastric body and fundus.  3.  Polypoid mucosa around the major papilla.   4.  Random biopsy taken from the second portion of the duodenum.  5.  Erosion of the duodenal bulb, biopsied.          TITLE OF PROCEDURE:  EGD with biopsies.     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     MEDICATIONS:  TIVA.     DESCRIPTION OF PROCEDURE:  The patient was placed in the left lateral decubitus position.  After  adequate sedation was achieved, an adult Olympus gastroscope was advanced  to the second portion of the duodenum.     Upon withdrawal, there was adequate visualization of the second portion of  the duodenum, duodenal bulb, stomach, and esophagus.  Retroflexed exam  looking into the gastric fundus was also performed.     FINDINGS:  Significant for a normal esophagus, as well as SC junction, which was at 43  cm from the incisors.     There were a significant number of patches of erythema in the antrum with  biopsies taken, submitted for pathologic evaluation.  Exams of the gastric  body and fundus were unremarkable.  Random biopsies taken from each of  these areas and submitted for pathologic evaluation.     Several small erosions were seen on the duodenal bulb with biopsies taken,  submitted for pathologic evaluation.     Polypoid lesion was seen around the major papilla/ampulla.  This polypoid  tissue was approximately 1 cm in diameter encompassed the papilla in a  circumferential manner.  Multiple biopsies taken and submitted for  pathologic evaluation.       The rest of exam of the second portion of the duodenum was unremarkable.   Random biopsies were taken from the second portion of the duodenum and  submitted for pathologic evaluation.      IMPRESSION AND PLAN:  1.  Gastroesophageal reflux disease - No evidence of complications  associated with acid reflux.  Continue dietary and lifestyle modification  as discussed in the office.  I will have the patient return to the office,  at which time we will discuss results and treatment recommendations for  GERD in the future.  2.  History of duodenal ulcer - the patient had gastritis and duodenitis as  described above.  Potentially due to aspirin alone.  However, biopsies  taken from the stomach to assess for H. pylori infection.  In addition,  duodenal biopsies taken to assess for celiac disease and parasitic  infection.  3.  Polypoid mucosa around the papilla - This had a somewhat atypical  appearance.  If they are adenomatous or dysplastic lesions, likely  recommend referral for EUS and potential ERCP.  Further recommendations  pending pathology results.  4.  Personal history of colon polyps - We will proceed with planned  colonoscopy.           D:  04/04/2018 12:20 PM by Dr. Catheryn Bacon. Jens Som, MD [phone])  T:  04/05/2018 07:24 AM by NTS      Everlean Cherry: [id number]) (Doc ID: [id number])

## 2018-04-05 NOTE — Op Note (Signed)
Procedure Date: 04/04/2018     Patient Type: A     SURGEON: Donella Stade MD  ASSISTANT:       PREOPERATIVE DIAGNOSIS:  Personal history of colon polyps     POSTOPERATIVE DIAGNOSIS:  Nine colon polyps removed with a combination of hot snare, cold snare, and  jumbo cold biopsy forceps.  One of the polypectomy sites in the cecum was  closed with 2 Endoclips.     TITLE OF PROCEDURE:  Colonoscopy with polypectomy.     INDICATION FOR PROCEDURE:  Personal history of colon polyps.     MEDICATIONS:  TIVA.     DESCRIPTION OF PROCEDURE:  The patient was placed in the left lateral decubitus position.  After  adequate sedation was achieved, a rectal exam was performed.  An Olympus  adult colonoscope was introduced into the rectum.  The instrument was then  advanced to the cecum.  This was confirmed by the presence of the  appendiceal orifice and the ileocecal valve.  The terminal ileum was  intubated.     Upon withdrawal, there was adequate visualization of the cecum, ascending  colon, transverse colon, descending colon, sigmoid colon, and rectum.   Retroflexed exam within the rectum was also performed.     FINDINGS:  Significant for a 1.2 cm polyp at hepatic flexure removed with cold snare.   Diminutive polyp at hepatic flexure removed with jumbo cold biopsy forceps.     A 2.2 cm, nodular unremarkable-appearing polyp in the cecum, removed with  hot snare using the ERBE generator in the coagulation position at 25-watt  setting.  A 2 cm globular polyp in the cecum, removed with hot snare using  the ERBE generator in the coagulation position at 25-watt setting.  After  removal of this latter lesion, 2 Endoclips were placed to close the  polypectomy defect.  A diminutive cecal polyp removed with cold biopsy  forceps.     Two diminutive polyps in the proximal ascending colon, removed with jumbo  cold biopsy forceps.     A 1.3 cm polyp at 51 cm from the anal verge removed with cold snare.     A 7 mm polyp at 22 cm from the anal  verge removed with jumbo biopsy  forceps.  All polyps described above were submitted for pathologic  evaluation.  This was a prolonged procedure, given the number of polyps  removed in techniques described above.  Exact time taken for the procedure  is documented in the nursing notes.       IMPRESSION AND PLAN:  1.  Colon polyps - if adenomatous or serrated, I would recommend a repeat  colonoscopy in the next 6 to 12 months pending histologic analysis.  If all  are benign or hyperplastic, I would recommend a repeat colonoscopy in 12  months.  This is taking into consideration the endoscopic appearance of the  majority of polyps removed, particularly those in the cecum.  Also, total  number of polyps removed.  2.  Duration of procedure - this was prolonged given the number of polyps  removed and different techniques described above.  Exact time taken for the  procedure is documented in the nursing notes.           D:  04/04/2018 12:23 PM by Dr. Catheryn Bacon. Jens Som, MD [phone])  T:  04/05/2018 07:38 AM by NTS      Everlean Cherry: [id number]) (Doc ID: [id number])

## 2018-04-07 LAB — LAB USE ONLY - HISTORICAL SURGICAL PATHOLOGY

## 2018-04-11 ENCOUNTER — Encounter (INDEPENDENT_AMBULATORY_CARE_PROVIDER_SITE_OTHER): Payer: Self-pay | Admitting: Family Medicine

## 2018-04-14 ENCOUNTER — Encounter (INDEPENDENT_AMBULATORY_CARE_PROVIDER_SITE_OTHER): Payer: Self-pay | Admitting: Family Medicine

## 2018-05-02 ENCOUNTER — Telehealth (INDEPENDENT_AMBULATORY_CARE_PROVIDER_SITE_OTHER): Payer: Self-pay

## 2018-05-02 NOTE — Telephone Encounter (Signed)
Patient is due for surgery on 06/23/2018 with Dr Nash Shearer, gastroenterologist.     Patient had a preop clearance done last month with Dr Eben Burow for a different procedure, colonoscopy. Patient only needs a medical clearance. Patient is having EGD EUS possible ERCR w/ resection of lesion around ampulla. Contact person for Dr Nash Shearer is Vernona Rieger. Left detailed message on Laura's voice mail, [phone]. Messages will be returned within 1 business day.     Patient agrees to having a detailed message on his mobile phone number, [phone] for instruction on whether or not another medical clearance is necessary for this procedure.     Patient is trying to avoid having another preop exam done for a clearance only.

## 2018-07-09 ENCOUNTER — Other Ambulatory Visit (INDEPENDENT_AMBULATORY_CARE_PROVIDER_SITE_OTHER): Payer: Self-pay | Admitting: Family Medicine

## 2018-07-14 ENCOUNTER — Other Ambulatory Visit (INDEPENDENT_AMBULATORY_CARE_PROVIDER_SITE_OTHER): Payer: Self-pay | Admitting: Family Medicine

## 2018-07-14 DIAGNOSIS — E119 Type 2 diabetes mellitus without complications: Secondary | ICD-10-CM

## 2018-07-14 DIAGNOSIS — Z794 Long term (current) use of insulin: Secondary | ICD-10-CM

## 2018-07-14 NOTE — Telephone Encounter (Signed)
Pt came in and had received a message from his pharmacy saying that he needed provider approval for the LANTUS 100 unit/ml injections.      His pharmacy is the [facility] in [place].    Patient phone #: [phone]

## 2018-07-29 ENCOUNTER — Ambulatory Visit (INDEPENDENT_AMBULATORY_CARE_PROVIDER_SITE_OTHER): Payer: Commercial Managed Care - POS | Admitting: Family Medicine

## 2018-08-09 ENCOUNTER — Ambulatory Visit (INDEPENDENT_AMBULATORY_CARE_PROVIDER_SITE_OTHER): Payer: Self-pay | Admitting: Cardiovascular Disease

## 2018-08-11 ENCOUNTER — Ambulatory Visit (INDEPENDENT_AMBULATORY_CARE_PROVIDER_SITE_OTHER): Payer: Commercial Managed Care - POS | Admitting: Family Medicine

## 2018-08-11 ENCOUNTER — Encounter (INDEPENDENT_AMBULATORY_CARE_PROVIDER_SITE_OTHER): Payer: Self-pay | Admitting: Family Medicine

## 2018-09-01 ENCOUNTER — Encounter (INDEPENDENT_AMBULATORY_CARE_PROVIDER_SITE_OTHER): Payer: Self-pay | Admitting: Family Medicine

## 2018-09-26 ENCOUNTER — Ambulatory Visit (INDEPENDENT_AMBULATORY_CARE_PROVIDER_SITE_OTHER): Payer: Commercial Managed Care - POS | Admitting: Family Medicine

## 2018-09-26 ENCOUNTER — Encounter (INDEPENDENT_AMBULATORY_CARE_PROVIDER_SITE_OTHER): Payer: Self-pay | Admitting: Family Medicine

## 2018-09-26 VITALS — BP 100/66 | HR 82 | Temp 98.0°F | Wt 212.8 lb

## 2018-09-26 DIAGNOSIS — Z794 Long term (current) use of insulin: Secondary | ICD-10-CM

## 2018-09-26 DIAGNOSIS — E119 Type 2 diabetes mellitus without complications: Secondary | ICD-10-CM

## 2018-09-26 DIAGNOSIS — I10 Essential (primary) hypertension: Secondary | ICD-10-CM

## 2018-09-26 DIAGNOSIS — I25700 Atherosclerosis of coronary artery bypass graft(s), unspecified, with unstable angina pectoris: Secondary | ICD-10-CM

## 2018-09-26 MED ORDER — SACUBITRIL-VALSARTAN 97-103 MG PO TABS
1.00 | ORAL_TABLET | Freq: Two times a day (BID) | ORAL | 3 refills | Status: DC
Start: 2018-09-26 — End: 2019-10-23

## 2018-09-26 NOTE — Progress Notes (Signed)
Have you seen any specialists/other providers since your last visit with [place]?    Yes, cardiologist and gastroenterologist     Arm preference verified?   Subjective:      Date: 09/26/2018 5:07 PM   Patient ID: [name] [name] is a 72 y.o. male.    Chief Complaint:  Chief Complaint   Patient presents with   . Post-op   . Medication Refill     for Entresto       HPI:  HPI  HTN: Hypertension is well controlled at this point.  Patient is checking his blood pressure regularly at least 3-4 times a week.  Blood pressure at home is always in normal range.  Patient avoids adding salt and is doing regular cardiac exercise.    Hyperlipidemia : Patient is compliant with current treatment for hyperlipidemia.  Is watching type of food and amount of calorie intake.  Tries to get with the weight into a BMI range of 25, is involved in regular exercising with cardiac workouts.    DM : Patient is compliant with current medication.  Tries to exercise at least 3 times a week.  Watches type of diet and amount of carbohydrate intake.  Check lab work for diabetes regularly in our clinic.        Problem List:  Patient Active Problem List   Diagnosis   . Chest pain   . Type 2 diabetes mellitus without complication, with long-term current use of insulin   . Heart attack   . CAD (coronary artery disease)   . Non-ischemic cardiomyopathy   . Acute exacerbation of CHF (congestive heart failure)   . PUD (peptic ulcer disease)   . Elevated LFTs   . Acute dyspnea   . Elevated d-dimer   . CHF (congestive heart failure)   . Headaches due to old head injury   . Essential hypertension   . Pneumonia due to infectious organism, unspecified laterality, unspecified part of lung   . Syncope and collapse   . AICD (automatic cardioverter/defibrillator) present   . Syncope, unspecified syncope type   . Pneumonia due to infectious organism   . Dyspnea, unspecified type   . Cardiomyopathy   . Cough   . Sepsis   . AKI (acute kidney injury)   . Sepsis, due to  unspecified organism   . SOB (shortness of breath)   . Acute on chronic systolic CHF (congestive heart failure)   . History of colon polyps   . Gastroesophageal reflux disease, esophagitis presence not specified       Current Medications:  Outpatient Medications Marked as Taking for the 09/26/18 encounter (Office Visit) with Zorita Pang, MD   Medication Sig Dispense Refill   . ALPRAZolam (XANAX) 0.25 MG tablet Take 0.25 mg by mouth nightly as needed     . amitriptyline (ELAVIL) 25 MG tablet amitriptyline 25 mg tablet   Take 1 tablet every EVENING by oral route.     Marland Kitchen aspirin EC 81 MG EC tablet Take 81 mg by mouth daily     . atorvastatin (LIPITOR) 40 MG tablet Take 1 tablet (40 mg total) by mouth nightly. (Patient taking differently: Take 40 mg by mouth every morning    ) 30 tablet 0   . budesonide-formoterol (SYMBICORT) 80-4.5 MCG/ACT inhaler Symbicort 80 mcg-4.5 mcg/actuation HFA aerosol inhaler   INHALE 2 PUFFS PO BID.     Marland Kitchen carvedilol (COREG) 25 MG tablet Take 1 tablet (25 mg total) by mouth 2 (two)  times daily with meals. 180 tablet 3   . docusate sodium (COLACE) 100 MG capsule Take 1 capsule (100 mg total) by mouth 2 (two) times daily.     Marland Kitchen glucose blood (ONE TOUCH ULTRA TEST) test strip 1 each by Other route 2 (two) times daily.Use as instructed 300 each 1   . insulin glargine (LANTUS SOLOSTAR) 100 UNIT/ML injection pen ADMINISTER 50 UNITS UNDER THE SKIN AT BEDTIME PER PATIENT. 45 mL 3   . insulin glargine (LANTUS) 100 UNIT/ML injection Inject 45 Units into the skin nightly. (Patient taking differently: Inject 50 Units into the skin nightly    ) 10 mL 0   . JANUMET 50-1000 MG tablet TAKE 1 TABLET BY MOUTH TWICE DAILY WITH FOOD. 180 tablet 3   . lisinopril (PRINIVIL,ZESTRIL) 10 MG tablet Take 1 tablet (10 mg total) by mouth daily. (Patient taking differently: Take 10 mg by mouth every morning    ) 30 tablet 0   . niacin (NIASPAN) 500 MG CR tablet Take 1 tablet (500 mg total) by mouth 2 (two) times  daily.niacin ER 500 mg tablet,extended release 24 hr 180 tablet 3   . sacubitril-valsartan (ENTRESTO) 97-103 MG Tab per tablet Take 1 tablet by mouth 2 (two) times daily 180 tablet 3   . tamsulosin (FLOMAX) 0.4 MG Cap Take 1 capsule (0.4 mg total) by mouth Daily after dinner. 90 capsule 3   . [DISCONTINUED] sacubitril-valsartan (ENTRESTO) 97-103 MG Tab per tablet Take 1 tablet by mouth 2 (two) times daily         Allergies:  No Known Allergies    Past Medical History:  Past Medical History:   Diagnosis Date   . Acute CHF     NOV  & DEC 2018, - DIALYSIS CATH PLACED Aug 24 2017 TO REMOVE FLUID    . Anxiety    . CHF (congestive heart failure) 08/12/2014, 2013   . Chronic obstructive pulmonary disease     POSSIBLE PER PT HE USES  SYNBICORT BID ABD  BREO INHALER PRN   . Coronary artery disease 2011    CORONARY STENT PLACEMENT FOLLOWED BY [facility]   . Depression    . Heart attack 2011    and 2014   . Hyperlipemia    . Hypertension    . ICD (implantable 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     PLACED 2014  [facility]  LAST INTERROGATION April 01 2018 REPORT REQUESTED   . Ischemic cardiomyopathy     EF 15% ON ECHO 09-20-2017 DR GARG IN [facility]   . Pacemaker 2014    MEDTRONIC ICD/PACEMAKER COMBO LAST INTERROGATION  12-12 2018   . Pneumonia 09/2016   . Primary cardiomyopathy     Ischemic cardiomyopathy EF 15% ON ECHO 09-20-2017 DR GARG IN [facility]   . Syncope and collapse     PRIOR TO ICD/PACEMAKER PLACED 2014   . Type 2 diabetes mellitus, controlled DX 1998    BS  AVG 100  A1C 7.4 Dec 27 2017   . Wheeze     PT SEE HIS PMD April 01 2018 RE THIS-TOLD TO SEE PMD BY [facility] JUNE 12 WHEN THEY SAW HIM FOR A CL       Past Surgical History:  Past Surgical History:   Procedure Laterality Date   . CARDIAC CATHETERIZATION  2011    CORONARY STENT PLACEMENT X 2 PER PT   . CARDIAC DEFIBRILLATOR PLACEMENT  2014    [facility]   . CARDIAC  PACEMAKER PLACEMENT  2014    [employer]TR[employer] ICD/PACEMAKER PLACED [facility]   . CIRCUMCISION  AGE 74   . COLONOSCOPY  2009   .  duodenal ulcer  1973    STRESS RELATED IN SCHOOL   . EGD N/A 08/15/2014    Procedure: EGD;  Surgeon: Colon Branch, MD;  Location: [facility];  Service: Gastroenterology;  Laterality: N/A;  egd w/ bx   . EGD  1975   . EGD, COLONOSCOPY N/A 04/04/2018    Procedure: EGD with bxs, COLONOSCOPY with polypectomy and clipping;  Surgeon: Doyne Keel, MD;  Location: [facility];  Service: Gastroenterology;  Laterality: N/A;   . FRACTURE SURGERY  AGE 89     RT  ANKLE- CAST APPLIED   . HERNIA REPAIR  AGE 74    LEFT INGUINAL HERNIA   . orthopedic surgery  AGE 18    RT foot corrective - HAMMERTOE   . TONSILLECTOMY AND ADENOIDECTOMY  1954       Family History:  Family History   Problem Relation Age of Onset   . Breast cancer Mother    . Heart attack Mother 25   . Hypertension Mother    . Diabetes Mother    . Diabetes Brother    . Heart attack Father    . Hypertension Father        Social History:  Social History     Socioeconomic History   . Marital status: Married     Spouse name: Lajoyce Corners   . Number of children: 0   . Years of education: Not on file   . Highest education level: Not on file   Occupational History   . 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 Financial resource strain: Not on file   . Food insecurity:     Worry: Not on file     Inability: Not on file   . Transportation needs:     Medical: Not on file     Non-medical: Not on file   Tobacco Use   . Smoking status: Never Smoker   . Smokeless tobacco: Never Used   Substance and Sexual Activity   . Alcohol use: No     Alcohol/week: 0.0 standard drinks   . Drug use: No   . Sexual activity: Yes     Partners: Female   Lifestyle   . Physical activity:     Days per week: Not on file     Minutes per session: Not on file   . Stress: Not on file   Relationships   . Social connections:     Talks on phone: Not on file     Gets together: Not on file     Attends religious service: Not on file     Active member of club or organization: Not  on file     Attends meetings of clubs or organizations: Not on file     Relationship status: Not on file   . Intimate partner violence:     Fear of current or ex partner: Not on file     Emotionally abused: Not on file     Physically abused: Not on file     Forced sexual activity: Not on file   Other Topics Concern   . Not on file   Social History Narrative   . Not on file       The following sections were reviewed this  encounter by the provider:   Tobacco  Allergies  Meds  Problems  Med Hx  Surg Hx  Fam Hx         Vitals:  BP 100/66   Pulse 82   Temp 98 F (36.7 C) (Oral)   Wt 96.5 kg (212 lb 12.8 oz)   SpO2 96%   BMI 30.53 kg/m     ROS:  Constitutional: Negative for fever, chills and malaise/fatigue.   Respiratory: Negative for cough, shortness of breath and wheezing.    Cardiovascular: Negative for chest pain and palpitations.   Gastrointestinal: Negative for abdominal pain, diarrhea and constipation.   Musculoskeletal: Negative for joint pain.   Skin: Negative for rash.   Neurological: Negative for dizziness and headaches.   Psychiatric/Behavioral: The patient does not have insomnia.         Objective:     Physical Exam:  Constitutional: Patient is oriented to person, place, and time. Appears well-developed and well-nourished.   HENT:   Head: Normocephalic and atraumatic.   Eyes: Conjunctivae and EOM are normal. Pupils are equal, round, and reactive to light.   Cardiovascular: Normal rate, regular rhythm and normal heart sounds.  Exam reveals no gallop and no friction rub.    No murmur heard.  Pulmonary/Chest: Effort normal and breath sounds normal. Has no wheezes.   Musculoskeletal: Normal range of motion. Exhibits no edema or tenderness.   Neurological: Patient is alert and oriented to person, place, and time.   Psychiatric: Patient has a normal mood and affect.        Assessment/Plan:       1. Coronary artery disease involving coronary bypass graft of native heart with unstable angina  pectoris  - sacubitril-valsartan (ENTRESTO) 97-103 MG Tab per tablet; Take 1 tablet by mouth 2 (two) times daily  Dispense: 180 tablet; Refill: 3  - Lipid panel; Future    2. Type 2 diabetes mellitus without complication, with long-term current use of insulin  - Hemoglobin A1C; Future  - Urine Microalbumin Random; Future    3. Essential hypertension  - CBC and differential; Future  - Comprehensive metabolic panel; Future        Zorita Pang, MD        Yes    The patient is due for eye exam and foot exam

## 2018-10-10 ENCOUNTER — Other Ambulatory Visit (FREE_STANDING_LABORATORY_FACILITY): Payer: Commercial Managed Care - POS

## 2018-10-10 DIAGNOSIS — D696 Thrombocytopenia, unspecified: Secondary | ICD-10-CM

## 2018-10-10 DIAGNOSIS — I10 Essential (primary) hypertension: Secondary | ICD-10-CM

## 2018-10-10 DIAGNOSIS — I25700 Atherosclerosis of coronary artery bypass graft(s), unspecified, with unstable angina pectoris: Secondary | ICD-10-CM

## 2018-10-10 DIAGNOSIS — E119 Type 2 diabetes mellitus without complications: Secondary | ICD-10-CM

## 2018-10-10 DIAGNOSIS — D72829 Elevated white blood cell count, unspecified: Secondary | ICD-10-CM

## 2018-10-10 DIAGNOSIS — J189 Pneumonia, unspecified organism: Secondary | ICD-10-CM

## 2018-10-10 DIAGNOSIS — Z794 Long term (current) use of insulin: Secondary | ICD-10-CM

## 2018-10-10 DIAGNOSIS — D7589 Other specified diseases of blood and blood-forming organs: Secondary | ICD-10-CM

## 2018-10-10 DIAGNOSIS — D72820 Lymphocytosis (symptomatic): Secondary | ICD-10-CM

## 2018-10-10 LAB — HEMOGLOBIN A1C
Average Estimated Glucose: 134.1 mg/dL
Hemoglobin A1C: 6.3 % — ABNORMAL HIGH (ref 4.6–5.9)

## 2018-10-10 LAB — CBC AND DIFFERENTIAL
Absolute NRBC: 0 10*3/uL (ref 0.00–0.00)
Hematocrit: 46.5 % (ref 37.6–49.6)
Hgb: 14.8 g/dL (ref 12.5–17.1)
MCH: 32.2 pg (ref 25.1–33.5)
MCHC: 31.8 g/dL (ref 31.5–35.8)
MCV: 101.3 fL — ABNORMAL HIGH (ref 78.0–96.0)
MPV: 12.8 fL — ABNORMAL HIGH (ref 8.9–12.5)
Nucleated RBC: 0 /100 WBC (ref 0.0–0.0)
Platelets: 125 10*3/uL — ABNORMAL LOW (ref 142–346)
RBC: 4.59 10*6/uL (ref 4.20–5.90)
RDW: 13 % (ref 11–15)
WBC: 12.21 10*3/uL — ABNORMAL HIGH (ref 3.10–9.50)

## 2018-10-10 LAB — CELL MORPHOLOGY
Cell Morphology: NORMAL
Platelet Estimate: DECREASED — AB

## 2018-10-10 LAB — LIPID PANEL
Cholesterol / HDL Ratio: 2.7
Cholesterol: 124 mg/dL (ref 0–199)
HDL: 46 mg/dL (ref 40–9999)
LDL Calculated: 63 mg/dL (ref 0–99)
Triglycerides: 75 mg/dL (ref 34–149)
VLDL Calculated: 15 mg/dL (ref 10–40)

## 2018-10-10 LAB — COMPREHENSIVE METABOLIC PANEL
ALT: 14 U/L (ref 0–55)
AST (SGOT): 18 U/L (ref 5–34)
Albumin/Globulin Ratio: 1.3 (ref 0.9–2.2)
Albumin: 4.3 g/dL (ref 3.5–5.0)
Alkaline Phosphatase: 91 U/L (ref 38–106)
BUN: 20 mg/dL (ref 9.0–28.0)
Bilirubin, Total: 0.7 mg/dL (ref 0.2–1.2)
CO2: 28 mEq/L (ref 21–29)
Calcium: 9.6 mg/dL (ref 7.9–10.2)
Chloride: 103 mEq/L (ref 100–111)
Creatinine: 1.3 mg/dL (ref 0.5–1.5)
Globulin: 3.2 g/dL (ref 2.0–3.7)
Glucose: 110 mg/dL — ABNORMAL HIGH (ref 70–100)
Potassium: 5 mEq/L (ref 3.5–5.1)
Protein, Total: 7.5 g/dL (ref 6.0–8.3)
Sodium: 140 mEq/L (ref 136–145)

## 2018-10-10 LAB — MAN DIFF ONLY
Atypical Lymphocytes %: 3 %
Atypical Lymphocytes Absolute: 0.37 10*3/uL — ABNORMAL HIGH (ref 0.00–0.00)
Band Neutrophils Absolute: 0.24 10*3/uL (ref 0.00–1.00)
Band Neutrophils: 2 %
Basophils Absolute Manual: 0 10*3/uL (ref 0.00–0.08)
Basophils Manual: 0 %
Eosinophils Absolute Manual: 0.37 10*3/uL (ref 0.00–0.44)
Eosinophils Manual: 3 %
Lymphocytes Absolute Manual: 8.67 10*3/uL — ABNORMAL HIGH (ref 0.42–3.22)
Lymphocytes Manual: 71 %
Monocytes Absolute: 0.24 10*3/uL (ref 0.21–0.85)
Monocytes Manual: 2 %
Neutrophils Absolute Manual: 2.32 10*3/uL (ref 1.10–6.33)
Segmented Neutrophils: 19 %

## 2018-10-10 LAB — HEMOLYSIS INDEX: Hemolysis Index: 9 (ref 0–18)

## 2018-10-10 LAB — GFR: EGFR: >60

## 2018-10-11 ENCOUNTER — Encounter (INDEPENDENT_AMBULATORY_CARE_PROVIDER_SITE_OTHER): Payer: Self-pay | Admitting: Family Medicine

## 2018-10-11 LAB — MICROALBUMIN, RANDOM URINE
Urine Creatinine, Random: 54.8 mg/dL
Urine Microalbumin, Random: 5 (ref 0.0–30.0)
Urine Microalbumin/Creatinine Ratio: 9 ug/mg (ref 0–30)

## 2018-10-12 ENCOUNTER — Other Ambulatory Visit (INDEPENDENT_AMBULATORY_CARE_PROVIDER_SITE_OTHER): Payer: Self-pay | Admitting: Family Medicine

## 2018-10-12 DIAGNOSIS — D72829 Elevated white blood cell count, unspecified: Secondary | ICD-10-CM

## 2018-10-17 ENCOUNTER — Encounter (INDEPENDENT_AMBULATORY_CARE_PROVIDER_SITE_OTHER): Payer: Self-pay | Admitting: Family Medicine

## 2018-10-17 ENCOUNTER — Encounter (INDEPENDENT_AMBULATORY_CARE_PROVIDER_SITE_OTHER): Payer: Self-pay

## 2018-10-20 LAB — CBC PATHOLOGIST REVIEW

## 2018-10-23 ENCOUNTER — Other Ambulatory Visit (INDEPENDENT_AMBULATORY_CARE_PROVIDER_SITE_OTHER): Payer: Self-pay | Admitting: Family Medicine

## 2018-10-31 ENCOUNTER — Ambulatory Visit (INDEPENDENT_AMBULATORY_CARE_PROVIDER_SITE_OTHER): Payer: Commercial Managed Care - POS | Admitting: Family Medicine

## 2018-11-10 ENCOUNTER — Other Ambulatory Visit (INDEPENDENT_AMBULATORY_CARE_PROVIDER_SITE_OTHER): Payer: Self-pay | Admitting: Family Medicine

## 2018-11-21 ENCOUNTER — Encounter (INDEPENDENT_AMBULATORY_CARE_PROVIDER_SITE_OTHER): Payer: Commercial Managed Care - POS | Admitting: Family Medicine

## 2018-11-23 ENCOUNTER — Other Ambulatory Visit (INDEPENDENT_AMBULATORY_CARE_PROVIDER_SITE_OTHER): Payer: Self-pay | Admitting: Family Medicine

## 2018-11-28 ENCOUNTER — Encounter (INDEPENDENT_AMBULATORY_CARE_PROVIDER_SITE_OTHER): Payer: Self-pay | Admitting: Family Medicine

## 2018-11-28 ENCOUNTER — Ambulatory Visit (INDEPENDENT_AMBULATORY_CARE_PROVIDER_SITE_OTHER): Payer: Commercial Managed Care - POS | Admitting: Family Medicine

## 2018-11-28 VITALS — BP 136/83 | HR 82 | Temp 97.4°F | Ht 70.0 in | Wt 216.2 lb

## 2018-11-28 DIAGNOSIS — Z0000 Encounter for general adult medical examination without abnormal findings: Secondary | ICD-10-CM

## 2018-11-28 LAB — CBC AND DIFFERENTIAL
Absolute NRBC: 0 10*3/uL (ref 0.00–0.00)
Basophils Absolute Automated: 0.04 10*3/uL (ref 0.00–0.08)
Basophils Automated: 0.3 %
Eosinophils Absolute Automated: 0.21 10*3/uL (ref 0.00–0.44)
Eosinophils Automated: 1.5 %
Hematocrit: 44.5 % (ref 37.6–49.6)
Hgb: 14.4 g/dL (ref 12.5–17.1)
Immature Granulocytes Absolute: 0.02 10*3/uL (ref 0.00–0.07)
Immature Granulocytes: 0.1 %
Lymphocytes Absolute Automated: 9.25 10*3/uL — ABNORMAL HIGH (ref 0.42–3.22)
Lymphocytes Automated: 64.5 %
MCH: 32.6 pg (ref 25.1–33.5)
MCHC: 32.4 g/dL (ref 31.5–35.8)
MCV: 100.7 fL — ABNORMAL HIGH (ref 78.0–96.0)
MPV: 11.9 fL (ref 8.9–12.5)
Monocytes Absolute Automated: 0.67 10*3/uL (ref 0.21–0.85)
Monocytes: 4.7 %
Neutrophils Absolute: 4.14 10*3/uL (ref 1.10–6.33)
Neutrophils: 28.9 %
Nucleated RBC: 0 /100 WBC (ref 0.0–0.0)
Platelets: 132 10*3/uL — ABNORMAL LOW (ref 142–346)
RBC: 4.42 10*6/uL (ref 4.20–5.90)
RDW: 13 % (ref 11–15)
WBC: 14.33 10*3/uL — ABNORMAL HIGH (ref 3.10–9.50)

## 2018-11-28 LAB — PSA: Prostate Specific Antigen, Total: 1.652 ng/mL (ref 0.000–4.000)

## 2018-11-28 LAB — MICROALBUMIN, RANDOM URINE
Urine Creatinine, Random: 119.2 mg/dL
Urine Microalbumin, Random: 23 (ref 0.0–30.0)
Urine Microalbumin/Creatinine Ratio: 19 ug/mg (ref 0–30)

## 2018-11-28 NOTE — Progress Notes (Signed)
Date: 11/28/2018 5:45 PM   Patient ID: [name] [name] is a 73 y.o. male.         Have you seen any specialists/other providers since your last visit with [place]?    No    Arm preference verified?   Yes    The patient is due for eye exam and foot exam    Subjective:      Chief Complaint:  Chief Complaint   Patient presents with   . Annual Exam     patient is NOT fasting, had a few cookies a few hours ago        HPI:  Visit Type: Health Maintenance Visit  Work Status: working full-time, Runner, 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Reported Health: excellent health  Reported Diet: compliant with well-balanced diet  Reported Exercise: 1-2x/week, 30-60 minutes/day and exercises at home  Dental: regular dental visits twice a year  Vision: glasses and eye exam > 1 year ago  Hearing: slightly decreased hearing  Immunization Status: immunizations up to date  Reproductive Health: sexually active  Prior Screening Tests:   General Health Risks: no family history of prostate cancer, no family history of colon cancer, family history of breast cancer and mother had breast cancer  Safety Elements Used: uses seat belts, smoke detectors in household and does not text and drive  Patient feels overall great, is exercising at least 3 times a week and is on a good diet.    No other concerns today       Problem List:  Patient Active Problem List   Diagnosis   . Chest pain   . Type 2 diabetes mellitus without complication, with long-term current use of insulin   . Heart attack   . CAD (coronary artery disease)   . Non-ischemic cardiomyopathy   . Acute exacerbation of CHF (congestive heart failure)   . PUD (peptic ulcer disease)   . Elevated LFTs   . Acute dyspnea   . Elevated d-dimer   . CHF (congestive heart failure)   . Headaches due to old head injury   . Essential hypertension   . Pneumonia due to infectious organism, unspecified laterality, unspecified part of lung   . Syncope and collapse   . AICD (automatic cardioverter/defibrillator) present   . Syncope,  unspecified syncope type   . Pneumonia due to infectious organism   . Dyspnea, unspecified type   . Cardiomyopathy   . Cough   . Sepsis   . AKI (acute kidney injury)   . Sepsis, due to unspecified organism   . SOB (shortness of breath)   . Acute on chronic systolic CHF (congestive heart failure)   . History of colon polyps   . Gastroesophageal reflux disease, esophagitis presence not specified       Current Medications:  Current Outpatient Medications   Medication Sig Dispense Refill   . aspirin EC 81 MG EC tablet Take 81 mg by mouth daily     . atorvastatin (LIPITOR) 40 MG tablet Take 1 tablet (40 mg total) by mouth nightly. (Patient taking differently: Take 40 mg by mouth every morning    ) 30 tablet 0   . budesonide-formoterol (SYMBICORT) 80-4.5 MCG/ACT inhaler Symbicort 80 mcg-4.5 mcg/actuation HFA aerosol inhaler   INHALE 2 PUFFS PO BID.     Marland Kitchen carvedilol (COREG) 25 MG tablet TAKE 1 TABLET(25 MG) BY MOUTH TWICE DAILY WITH MEALS 180 tablet 0   . docusate sodium (COLACE) 100 MG capsule Take 1 capsule (100 mg total)  by mouth 2 (two) times daily.     . furosemide (LASIX) 40 MG tablet TAKE 2 TABLETS(80 MG) BY MOUTH TWICE DAILY 60 tablet 0   . glucose blood (ONE TOUCH ULTRA TEST) test strip 1 each by Other route 2 (two) times daily.Use as instructed 300 each 1   . insulin glargine (LANTUS SOLOSTAR) 100 UNIT/ML injection pen ADMINISTER 50 UNITS UNDER THE SKIN AT BEDTIME PER PATIENT. 45 mL 3   . insulin glargine (LANTUS) 100 UNIT/ML injection Inject 45 Units into the skin nightly. (Patient taking differently: Inject 50 Units into the skin nightly    ) 10 mL 0   . JANUMET 50-1000 MG tablet TAKE 1 TABLET BY MOUTH TWICE DAILY WITH FOOD. 180 tablet 3   . niacin (NIASPAN) 500 MG CR tablet Take 1 tablet (500 mg total) by mouth 2 (two) times daily.niacin ER 500 mg tablet,extended release 24 hr 180 tablet 3   . polyethylene glycol (MIRALAX) packet Take 17 g by mouth daily as needed (constipation). (Patient taking differently:  Take 17 g by mouth every morning    ) 24 each 0   . sacubitril-valsartan (ENTRESTO) 97-103 MG Tab per tablet Take 1 tablet by mouth 2 (two) times daily 180 tablet 3   . tamsulosin (FLOMAX) 0.4 MG Cap Take 1 capsule (0.4 mg total) by mouth Daily after dinner. 90 capsule 3   . ALPRAZolam (XANAX) 0.25 MG tablet Take 0.25 mg by mouth nightly as needed     . amitriptyline (ELAVIL) 25 MG tablet amitriptyline 25 mg tablet   Take 1 tablet every EVENING by oral route.       No current facility-administered medications for this visit.        Allergies:  No Known Allergies    Past Medical History:  Past Medical History:   Diagnosis Date   . Acute CHF     NOV  & DEC 2018, - DIALYSIS CATH PLACED Aug 24 2017 TO REMOVE FLUID    . Anxiety    . CHF (congestive heart failure) 08/12/2014, 2013   . Chronic obstructive pulmonary disease     POSSIBLE PER PT HE USES  SYNBICORT BID ABD  BREO INHALER PRN   . Coronary artery disease 2011    CORONARY STENT PLACEMENT FOLLOWED BY [facility]   . Depression    . Heart attack 2011    and 2014   . Hyperlipemia    . Hypertension    . ICD (implantable 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     PLACED 2014  [facility]  LAST INTERROGATION April 01 2018 REPORT REQUESTED   . Ischemic cardiomyopathy     EF 15% ON ECHO 09-20-2017 DR GARG IN [facility]   . Pacemaker 2014    [employer]TR[employer]IC ICD/PACEMAKER COMBO LAST INTERROGATION  12-12 2018   . Pneumonia 09/2016   . Primary cardiomyopathy     Ischemic cardiomyopathy EF 15% ON ECHO 09-20-2017 DR GARG IN [facility]   . Syncope and collapse     PRIOR TO ICD/PACEMAKER PLACED 2014   . Type 2 diabetes mellitus, controlled DX 1998    BS  AVG 100  A1C 7.4 Dec 27 2017   . Wheeze     PT SEE HIS PMD April 01 2018 RE THIS-TOLD TO SEE PMD BY [facility] JUNE 12 WHEN THEY SAW HIM FOR A CL       Past Surgical History:  Past Surgical History:   Procedure Laterality Date   . CARDIAC CATHETERIZATION  2011    CORONARY STENT PLACEMENT X 2 PER PT   . CARDIAC DEFIBRILLATOR PLACEMENT  2014    [facility]   .  CARDIAC PACEMAKER PLACEMENT  2014    [employer] ICD/PACEMAKER PLACED [facility]   . CIRCUMCISION  AGE 28   . COLONOSCOPY  2009   . duodenal ulcer  1973    STRESS RELATED IN SCHOOL   . EGD N/A 08/15/2014    Procedure: EGD;  Surgeon: Colon Branch, MD;  Location: [facility];  Service: Gastroenterology;  Laterality: N/A;  egd w/ bx   . EGD  1975   . EGD, COLONOSCOPY N/A 04/04/2018    Procedure: EGD with bxs, COLONOSCOPY with polypectomy and clipping;  Surgeon: Doyne Keel, MD;  Location: [facility];  Service: Gastroenterology;  Laterality: N/A;   . FRACTURE SURGERY  AGE 40     RT  ANKLE- CAST APPLIED   . HERNIA REPAIR  AGE 28    LEFT INGUINAL HERNIA   . orthopedic surgery  AGE 67    RT foot corrective - HAMMERTOE   . TONSILLECTOMY AND ADENOIDECTOMY  1954       Family History:  Family History   Problem Relation Age of Onset   . Breast cancer Mother    . Heart attack Mother 74   . Hypertension Mother    . Diabetes Mother    . Diabetes Brother    . Heart attack Father    . Hypertension Father        Social History:  Social History     Socioeconomic History   . Marital status: Married     Spouse name: Lajoyce Corners   . Number of children: 0   . Years of education: Not on file   . Highest education level: Not on file   Occupational History   . 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 Financial resource strain: Not on file   . Food insecurity:     Worry: Not on file     Inability: Not on file   . Transportation needs:     Medical: Not on file     Non-medical: Not on file   Tobacco Use   . Smoking status: Never Smoker   . Smokeless tobacco: Never Used   Substance and Sexual Activity   . Alcohol use: No     Alcohol/week: 0.0 standard drinks   . Drug use: No   . Sexual activity: Yes     Partners: Female   Lifestyle   . Physical activity:     Days per week: Not on file     Minutes per session: Not on file   . Stress: Not on file   Relationships   . Social connections:     Talks on phone: Not on  file     Gets together: Not on file     Attends religious service: Not on file     Active member of club or organization: Not on file     Attends meetings of clubs or organizations: Not on file     Relationship status: Not on file   . Intimate partner violence:     Fear of current or ex partner: Not on file     Emotionally abused: Not on file     Physically abused: Not on file     Forced sexual activity: Not on file   Other Topics Concern   .  Not on file   Social History Narrative   . Not on file       The following sections were reviewed this encounter by the provider:   Tobacco  Allergies  Meds  Problems  Med Hx  Surg Hx  Fam Hx         Vitals:  BP 136/83   Pulse 82   Temp 97.4 F (36.3 C) (Oral)   Ht 1.778 m (5\' 10" )   Wt 98.1 kg (216 lb 3.2 oz)   SpO2 97%   BMI 31.02 kg/m      ROS:   General/Constitutional:   Denies Change in appetite. Denies Chills. Denies Fatigue. Denies Fever. Denies Weight gain. Denies Weight loss.   Ophthalmologic:   Denies Blurred vision. Denies Diminished visual acuity. Denies Eye Pain.   ENT:   Denies Hearing Loss. Denies Nasal Discharge. Denies Hoarseness. Denies Ear pain. Denies Nosebleed. Denies Sinus pain. Denies Sore throat. Denies Sneezing.   Endocrine:   Denies Polydipsia. Denies Polyuria.   Cardiovascular:   Denies Chest pain. Denies Chest pain with exertion. Denies Leg Claudication. Denies Palpitations. Denies Swelling in hands/feet.   Respiratory:   Denies Paroxysmal Nocturnal Dyspnea. Denies Cough. Denies Orthopnea. Denies Shortness of breath. Denies Daytime Hypersomnolence. Denies Snoring. Denies Witness Apnea. Denies Wheezing.   Gastrointestinal:   Denies Abdominal pain. Denies Blood in stool. Denies Constipation. Denies Diarrhea. Denies Heartburn. Denies Nausea. Denies Vomiting.   Hematology:   Denies Easy bruising. Denies Easy Bleeding.   Genitourinary:   Denies Blood in urine. Denies Nocturia.   Musculoskeletal:   Denies Joint pain. Denies Leg cramps.  Denies Weakness in LE. Denies Swollen joints.   Peripheral Vascular:   Denies Cold extremities. Denies Decreased sensation in extremities. Denies Painful extremities.   Skin:   Denies Itching. Denies Change in Mole(s). Denies Rash.   Neurologic:   Denies Balance difficulty. Denies Dizziness. Denies Headache. Denies Pre-Syncope. Denies Memory loss.   Psychiatric:   Denies Anxiety. Denies Depressed mood. Denies Difficulty sleeping. Denies Mood Swings.     Objective:     Examination:   General Examination:   GENERAL APPEARANCE: alert, in no acute distress, well developed, well nourished, oriented to time, place, and person.   HEAD: normal appearance, atraumatic.   EYES: extraocular movement intact (EOMI), pupils equal, round, reactive to light and accommodation, sclera anicteric, conjunctiva clear.   EARS: tympanic membranes normal bilaterally, external canals normal .   NOSE: normal nasal mucosa, no lesions.   ORAL CAVITY: normal oropharynx, normal lips, mucosa moist, no lesions.   THROAT: normal appearance, clear, no erythema.   NECK/THYROID: neck supple, no carotid bruit, carotid pulse 2+ bilaterally, no cervical lymphadenopathy, no neck mass palpated, no jugular venous distention, no thyromegaly.   LYMPH NODES: no palpable adenopathy.   SKIN: good turgor, no rashes, no suspicious lesions.   HEART: S1, S2 normal, no murmurs, rubs, gallops, regular rate and rhythm.   LUNGS: normal effort / no distress, normal breath sounds, clear to auscultation bilaterally, no wheezes, rales, rhonchi.   ABDOMEN: bowel sounds present, no hepatosplenomegaly, soft, nontender, nondistended.   RECTAL: not examined.   MALE GENITOURINARY: no penile lesions or discharge, no testicular mass.   MUSCULOSKELETAL: full range of motion, no swelling or deformity.   EXTREMITIES: no edema, no clubbing, cyanosis, or edema.   PERIPHERAL PULSES: 2+ dorsalis pedis, 2+ posterior tibial.   NEUROLOGIC: nonfocal, cranial nerves 2-12 grossly intact, deep  tendon reflexes 2+ symmetrical, normal strength, tone and reflexes,  sensory exam intact.   PSYCH: cognitive function intact, mood/affect full range, speech clear.     Assessment/Plan:       1. Well adult exam  - CBC and differential  - Comprehensive metabolic panel  - Lipid panel  - Urine Microalbumin Random  Recommend to continue to push exercise and stay on a good diet . Try to get to the perfect BMI of 25. Start your day with a high fiber diet and  involve the family in your exercise regiment .

## 2018-11-29 LAB — COMPREHENSIVE METABOLIC PANEL
ALT: 12 U/L (ref 0–55)
AST (SGOT): 18 U/L (ref 5–34)
Albumin/Globulin Ratio: 1.6 (ref 0.9–2.2)
Albumin: 4.5 g/dL (ref 3.5–5.0)
Alkaline Phosphatase: 88 U/L (ref 38–106)
BUN: 12 mg/dL (ref 9.0–28.0)
Bilirubin, Total: 0.7 mg/dL (ref 0.2–1.2)
CO2: 26 mEq/L (ref 21–29)
Calcium: 9.7 mg/dL (ref 7.9–10.2)
Chloride: 103 mEq/L (ref 100–111)
Creatinine: 1.1 mg/dL (ref 0.5–1.5)
Globulin: 2.9 g/dL (ref 2.0–3.7)
Glucose: 48 mg/dL — CL (ref 70–100)
Potassium: 4 mEq/L (ref 3.5–5.1)
Protein, Total: 7.4 g/dL (ref 6.0–8.3)
Sodium: 139 mEq/L (ref 136–145)

## 2018-11-29 LAB — LIPID PANEL
Cholesterol / HDL Ratio: 2.6
Cholesterol: 117 mg/dL (ref 0–199)
HDL: 45 mg/dL (ref 40–9999)
LDL Calculated: 63 mg/dL (ref 0–99)
Triglycerides: 46 mg/dL (ref 34–149)
VLDL Calculated: 9 mg/dL — ABNORMAL LOW (ref 10–40)

## 2018-11-29 LAB — GFR: EGFR: >60

## 2018-11-29 LAB — HEMOLYSIS INDEX: Hemolysis Index: 5 (ref 0–18)

## 2018-12-01 ENCOUNTER — Other Ambulatory Visit (INDEPENDENT_AMBULATORY_CARE_PROVIDER_SITE_OTHER): Payer: Self-pay | Admitting: Family Medicine

## 2018-12-01 DIAGNOSIS — D72829 Elevated white blood cell count, unspecified: Secondary | ICD-10-CM

## 2018-12-01 NOTE — Progress Notes (Signed)
Excellent results . All results are in normal range, except glucose is a little bit on the lower end.  Please make sure that if patient feels dizzy to make sure that he drinks some orange juice or has some glucose in his pocket.  White blood count is elevated.  I am not sure why there might be some kind of infection.  Recommend to follow-up in 2 3 days and recheck the CBC please let me know if patient is symptomatic.Marland Kitchen Please continue current treatment and follow up as discussed with last visit .

## 2018-12-20 ENCOUNTER — Telehealth (INDEPENDENT_AMBULATORY_CARE_PROVIDER_SITE_OTHER): Payer: Self-pay | Admitting: Family Medicine

## 2018-12-20 DIAGNOSIS — E0800 Diabetes mellitus due to underlying condition with hyperosmolarity without nonketotic hyperglycemic-hyperosmolar coma (NKHHC): Secondary | ICD-10-CM

## 2018-12-20 MED ORDER — GLUCOSE BLOOD VI STRP
1.00 | ORAL_STRIP | Freq: Two times a day (BID) | 1 refills | Status: AC
Start: 2018-12-20 — End: ?

## 2018-12-20 NOTE — Telephone Encounter (Signed)
Pt needs a refill on one touch ultra test strip sent in.

## 2018-12-20 NOTE — Telephone Encounter (Signed)
Talked to pt and he will pick up meds

## 2018-12-20 NOTE — Telephone Encounter (Signed)
Refilled test strips as requested .  Thank you .

## 2018-12-26 ENCOUNTER — Other Ambulatory Visit (INDEPENDENT_AMBULATORY_CARE_PROVIDER_SITE_OTHER): Payer: Self-pay | Admitting: Family Medicine

## 2018-12-30 ENCOUNTER — Other Ambulatory Visit (INDEPENDENT_AMBULATORY_CARE_PROVIDER_SITE_OTHER): Payer: Self-pay | Admitting: Family Medicine

## 2019-01-18 ENCOUNTER — Ambulatory Visit (INDEPENDENT_AMBULATORY_CARE_PROVIDER_SITE_OTHER): Payer: Self-pay | Admitting: Cardiovascular Disease

## 2019-01-23 ENCOUNTER — Encounter (INDEPENDENT_AMBULATORY_CARE_PROVIDER_SITE_OTHER): Payer: Self-pay | Admitting: Family Medicine

## 2019-02-01 ENCOUNTER — Telehealth (INDEPENDENT_AMBULATORY_CARE_PROVIDER_SITE_OTHER): Payer: Self-pay

## 2019-02-01 ENCOUNTER — Encounter (INDEPENDENT_AMBULATORY_CARE_PROVIDER_SITE_OTHER): Payer: Self-pay

## 2019-02-02 ENCOUNTER — Other Ambulatory Visit (INDEPENDENT_AMBULATORY_CARE_PROVIDER_SITE_OTHER): Payer: Self-pay | Admitting: Family Medicine

## 2019-02-02 NOTE — Telephone Encounter (Signed)
Last filled March 2020.  Last o/v February 2020.  Pt  has an upcoming appointment on 02/06/2019 with Dr. Altamease Oiler   Queued up 30 day supply with 0 refill(s).

## 2019-02-06 ENCOUNTER — Encounter (INDEPENDENT_AMBULATORY_CARE_PROVIDER_SITE_OTHER): Payer: Self-pay | Admitting: Family Medicine

## 2019-02-06 ENCOUNTER — Ambulatory Visit (INDEPENDENT_AMBULATORY_CARE_PROVIDER_SITE_OTHER): Payer: Commercial Managed Care - POS | Admitting: Family Medicine

## 2019-02-06 VITALS — BP 113/73 | HR 81 | Temp 97.8°F | Wt 217.0 lb

## 2019-02-06 DIAGNOSIS — E119 Type 2 diabetes mellitus without complications: Secondary | ICD-10-CM

## 2019-02-06 DIAGNOSIS — E7800 Pure hypercholesterolemia, unspecified: Secondary | ICD-10-CM

## 2019-02-06 DIAGNOSIS — I10 Essential (primary) hypertension: Secondary | ICD-10-CM

## 2019-02-06 DIAGNOSIS — Z794 Long term (current) use of insulin: Secondary | ICD-10-CM

## 2019-02-06 LAB — COMPREHENSIVE METABOLIC PANEL
ALT: 11 U/L (ref 0–55)
AST (SGOT): 13 U/L (ref 5–34)
Albumin/Globulin Ratio: 1.5 (ref 0.9–2.2)
Albumin: 4.3 g/dL (ref 3.5–5.0)
Alkaline Phosphatase: 101 U/L (ref 38–106)
BUN: 27 mg/dL (ref 9.0–28.0)
Bilirubin, Total: 0.6 mg/dL (ref 0.2–1.2)
CO2: 27 mEq/L (ref 21–29)
Calcium: 9.4 mg/dL (ref 7.9–10.2)
Chloride: 100 mEq/L (ref 100–111)
Creatinine: 1.5 mg/dL (ref 0.5–1.5)
Globulin: 2.9 g/dL (ref 2.0–3.7)
Glucose: 197 mg/dL — ABNORMAL HIGH (ref 70–100)
Potassium: 4.8 mEq/L (ref 3.5–5.1)
Protein, Total: 7.2 g/dL (ref 6.0–8.3)
Sodium: 140 mEq/L (ref 136–145)

## 2019-02-06 LAB — CBC AND DIFFERENTIAL
Absolute NRBC: 0 10*3/uL (ref 0.00–0.00)
Basophils Absolute Automated: 0.02 10*3/uL (ref 0.00–0.08)
Basophils Automated: 0.2 %
Eosinophils Absolute Automated: 0.19 10*3/uL (ref 0.00–0.44)
Eosinophils Automated: 1.5 %
Hematocrit: 45.8 % (ref 37.6–49.6)
Hgb: 14.8 g/dL (ref 12.5–17.1)
Immature Granulocytes Absolute: 0.04 10*3/uL (ref 0.00–0.07)
Immature Granulocytes: 0.3 %
Lymphocytes Absolute Automated: 7.93 10*3/uL — ABNORMAL HIGH (ref 0.42–3.22)
Lymphocytes Automated: 63.2 %
MCH: 32.1 pg (ref 25.1–33.5)
MCHC: 32.3 g/dL (ref 31.5–35.8)
MCV: 99.3 fL — ABNORMAL HIGH (ref 78.0–96.0)
MPV: 12.6 fL — ABNORMAL HIGH (ref 8.9–12.5)
Monocytes Absolute Automated: 0.35 10*3/uL (ref 0.21–0.85)
Monocytes: 2.8 %
Neutrophils Absolute: 4.01 10*3/uL (ref 1.10–6.33)
Neutrophils: 32 %
Nucleated RBC: 0 /100 WBC (ref 0.0–0.0)
Platelets: 125 10*3/uL — ABNORMAL LOW (ref 142–346)
RBC: 4.61 10*6/uL (ref 4.20–5.90)
RDW: 13 % (ref 11–15)
WBC: 12.54 10*3/uL — ABNORMAL HIGH (ref 3.10–9.50)

## 2019-02-06 LAB — LIPID PANEL
Cholesterol / HDL Ratio: 3.1
Cholesterol: 110 mg/dL (ref 0–199)
HDL: 36 mg/dL — ABNORMAL LOW (ref 40–9999)
LDL Calculated: 52 mg/dL (ref 0–99)
Triglycerides: 111 mg/dL (ref 34–149)
VLDL Calculated: 22 mg/dL (ref 10–40)

## 2019-02-06 LAB — MICROALBUMIN, RANDOM URINE
Urine Creatinine, Random: 218 mg/dL
Urine Microalbumin, Random: 15 (ref 0.0–30.0)
Urine Microalbumin/Creatinine Ratio: 7 ug/mg (ref 0–30)

## 2019-02-06 LAB — GFR: EGFR: 55.6

## 2019-02-06 LAB — HEMOLYSIS INDEX: Hemolysis Index: 6 (ref 0–18)

## 2019-02-06 MED ORDER — CARVEDILOL 25 MG PO TABS
ORAL_TABLET | ORAL | 3 refills | Status: DC
Start: 2019-02-06 — End: 2020-03-06

## 2019-02-06 MED ORDER — BUDESONIDE-FORMOTEROL FUMARATE 80-4.5 MCG/ACT IN AERO
INHALATION_SPRAY | RESPIRATORY_TRACT | 3 refills | Status: DC
Start: 2019-02-06 — End: 2020-01-26

## 2019-02-06 NOTE — Progress Notes (Signed)
Have you seen any specialists/other providers since your last visit with [place]?    No    Arm preference verified?   Yes    The patient is due for foot exam and DM eye exam; [facility]; shingrix

## 2019-02-06 NOTE — Progress Notes (Signed)
Subjective:      Date: 02/06/2019 10:15 AM   Patient ID: [name] [name] is a 73 y.o. male.    Chief Complaint:  Chief Complaint   Patient presents with   . Diabetes Follow-up   . Medication Refill     tamsulosin; symbicort   . Hypertension     f/u       HPI:  HPI    HTN: Hypertension is well controlled at this point.  Patient is checking his blood pressure regularly at least 3-4 times a week.  Blood pressure at home is always in normal range.  Patient avoids adding salt and is doing regular cardiac exercise.    Hyperlipidemia : Patient is compliant with current treatment for hyperlipidemia.  Is watching type of food and amount of calorie intake.  Tries to get with the weight into a BMI range of 25, is involved in regular exercising with cardiac workouts.    DM : Patient is compliant with current medication.  Tries to exercise at least 3 times a week.  Watches type of diet and amount of carbohydrate intake.  Check lab work for diabetes regularly in our clinic.      Problem List:  Patient Active Problem List   Diagnosis   . Chest pain   . Type 2 diabetes mellitus without complication, with long-term current use of insulin   . Heart attack   . CAD (coronary artery disease)   . Non-ischemic cardiomyopathy   . Acute exacerbation of CHF (congestive heart failure)   . PUD (peptic ulcer disease)   . Elevated LFTs   . Acute dyspnea   . Elevated d-dimer   . CHF (congestive heart failure)   . Headaches due to old head injury   . Essential hypertension   . Pneumonia due to infectious organism, unspecified laterality, unspecified part of lung   . Syncope and collapse   . AICD (automatic cardioverter/defibrillator) present   . Syncope, unspecified syncope type   . Pneumonia due to infectious organism   . Dyspnea, unspecified type   . Cardiomyopathy   . Cough   . Sepsis   . AKI (acute kidney injury)   . Sepsis, due to unspecified organism   . SOB (shortness of breath)   . Acute on chronic systolic CHF (congestive heart  failure)   . History of colon polyps   . Gastroesophageal reflux disease, esophagitis presence not specified       Current Medications:  Outpatient Medications Marked as Taking for the 02/06/19 encounter (Office Visit) with Zorita Pang, MD   Medication Sig Dispense Refill   . ALPRAZolam (XANAX) 0.25 MG tablet Take 0.25 mg by mouth nightly as needed     . amitriptyline (ELAVIL) 25 MG tablet amitriptyline 25 mg tablet   Take 1 tablet every EVENING by oral route.     Marland Kitchen aspirin EC 81 MG EC tablet Take 81 mg by mouth daily     . atorvastatin (LIPITOR) 40 MG tablet Take 1 tablet (40 mg total) by mouth nightly. (Patient taking differently: Take 40 mg by mouth every morning    ) 30 tablet 0   . budesonide-formoterol (SYMBICORT) 80-4.5 MCG/ACT inhaler Symbicort 80 mcg-4.5 mcg/actuation HFA aerosol inhaler   INHALE 2 PUFFS PO BID.     Marland Kitchen carvedilol (COREG) 25 MG tablet TAKE 1 TABLET(25 MG) BY MOUTH TWICE DAILY WITH MEALS 180 tablet 0   . docusate sodium (COLACE) 100 MG capsule Take 1 capsule (100 mg total)  by mouth 2 (two) times daily.     . furosemide (LASIX) 40 MG tablet TAKE 2 TABLETS(80 MG) BY MOUTH TWICE DAILY 60 tablet 0   . glucose blood (ONE TOUCH ULTRA TEST) test strip 1 each by Other route 2 (two) times daily Use as instructed 300 each 1   . insulin glargine (LANTUS SOLOSTAR) 100 UNIT/ML injection pen ADMINISTER 50 UNITS UNDER THE SKIN AT BEDTIME PER PATIENT. 45 mL 3   . JANUMET 50-1000 MG tablet TAKE 1 TABLET BY MOUTH TWICE DAILY WITH FOOD. 180 tablet 3   . niacin (NIASPAN) 500 MG CR tablet Take 1 tablet (500 mg total) by mouth 2 (two) times daily.niacin ER 500 mg tablet,extended release 24 hr 180 tablet 3   . polyethylene glycol (MIRALAX) packet Take 17 g by mouth daily as needed (constipation). (Patient taking differently: Take 17 g by mouth every morning    ) 24 each 0   . sacubitril-valsartan (ENTRESTO) 97-103 MG Tab per tablet Take 1 tablet by mouth 2 (two) times daily 180 tablet 3   . tamsulosin (FLOMAX) 0.4  MG Cap TAKE 1 CAPSULE(0.4 MG) BY MOUTH DAILY AFTER DINNER 90 capsule 3       Allergies:  No Known Allergies    Past Medical History:  Past Medical History:   Diagnosis Date   . Acute CHF     NOV  & DEC 2018, - DIALYSIS CATH PLACED Aug 24 2017 TO REMOVE FLUID    . Anxiety    . CHF (congestive heart failure) 08/12/2014, 2013   . Chronic obstructive pulmonary disease     POSSIBLE PER PT HE USES  SYNBICORT BID ABD  BREO INHALER PRN   . Coronary artery disease 2011    CORONARY STENT PLACEMENT FOLLOWED BY [facility]   . Depression    . Heart attack 2011    and 2014   . Hyperlipemia    . Hypertension    . ICD (implantable 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     PLACED 2014  [facility]  LAST INTERROGATION April 01 2018 REPORT REQUESTED   . Ischemic cardiomyopathy     EF 15% ON ECHO 09-20-2017 DR GARG IN [facility]   . Pacemaker 2014    MEDTRONIC ICD/PACEMAKER COMBO LAST INTERROGATION  12-12 2018   . Pneumonia 09/2016   . Primary cardiomyopathy     Ischemic cardiomyopathy EF 15% ON ECHO 09-20-2017 DR GARG IN EP[facility]   . Syncope and collapse     PRIOR TO ICD/PACEMAKER PLACED 2014   . Type 2 diabetes mellitus, controlled DX 1998    BS  AVG 100  A1C 7.4 Dec 27 2017   . Wheeze     PT SEE HIS PMD April 01 2018 RE THIS-TOLD TO SEE PMD BY [facility] JUNE 12 WHEN THEY SAW HIM FOR A CL       Past Surgical History:  Past Surgical History:   Procedure Laterality Date   . CARDIAC CATHETERIZATION  2011    CORONARY STENT PLACEMENT X 2 PER PT   . CARDIAC DEFIBRILLATOR PLACEMENT  2014    [facility]   . CARDIAC PACEMAKER PLACEMENT  2014    [employer] ICD/PACEMAKER PLACED [facility]   . CIRCUMCISION  AGE 32   . COLONOSCOPY  2009   . duodenal ulcer  1973    STRESS RELATED IN SCHOOL   . EGD N/A 08/15/2014    Procedure: EGD;  Surgeon: Colon Branch, MD;  [facility];  Service: Gastroenterology;  Laterality: N/A;  egd w/ bx   . EGD  1975   . EGD, COLONOSCOPY N/A 04/04/2018    Procedure: EGD with bxs, COLONOSCOPY with polypectomy and clipping;  Surgeon:  Doyne Keel, MD;  Location: [facility];  Service: Gastroenterology;  Laterality: N/A;   . FRACTURE SURGERY  AGE 89     RT  ANKLE- CAST APPLIED   . HERNIA REPAIR  AGE 39    LEFT INGUINAL HERNIA   . orthopedic surgery  AGE 46    RT foot corrective - HAMMERTOE   . TONSILLECTOMY AND ADENOIDECTOMY  1954       Family History:  Family History   Problem Relation Age of Onset   . Breast cancer Mother    . Heart attack Mother 24   . Hypertension Mother    . Diabetes Mother    . Diabetes Brother    . Heart attack Father    . Hypertension Father        Social History:  Social History     Socioeconomic History   . Marital status: Married     Spouse name: Lajoyce Corners   . Number of children: 0   . Years of education: Not on file   . Highest education level: Not on file   Occupational History   . 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 Financial resource strain: Not on file   . Food insecurity:     Worry: Not on file     Inability: Not on file   . Transportation needs:     Medical: Not on file     Non-medical: Not on file   Tobacco Use   . Smoking status: Never Smoker   . Smokeless tobacco: Never Used   Substance and Sexual Activity   . Alcohol use: No     Alcohol/week: 0.0 standard drinks   . Drug use: No   . Sexual activity: Yes     Partners: Female   Lifestyle   . Physical activity:     Days per week: Not on file     Minutes per session: Not on file   . Stress: Not on file   Relationships   . Social connections:     Talks on phone: Not on file     Gets together: Not on file     Attends religious service: Not on file     Active member of club or organization: Not on file     Attends meetings of clubs or organizations: Not on file     Relationship status: Not on file   . Intimate partner violence:     Fear of current or ex partner: Not on file     Emotionally abused: Not on file     Physically abused: Not on file     Forced sexual activity: Not on file   Other Topics Concern   . Not on file   Social  History Narrative   . Not on file       The following sections were reviewed this encounter by the provider:   Tobacco  Allergies  Meds  Problems  Med Hx  Surg Hx  Fam Hx         Vitals:  BP 113/73 (BP Site: Left arm, Patient Position: Sitting, Cuff Size: Medium)   Pulse 81   Temp 97.8 F (36.6 C) (Oral)   Wt 98.4  kg (217 lb)   SpO2 97%   BMI 31.14 kg/m     ROS:  Constitutional: Negative for fever, chills and malaise/fatigue.   Respiratory: Negative for cough, shortness of breath and wheezing.    Cardiovascular: Negative for chest pain and palpitations.   Gastrointestinal: Negative for abdominal pain, diarrhea and constipation.   Musculoskeletal: Negative for joint pain.   Skin: Negative for rash.   Neurological: Negative for dizziness and headaches.   Psychiatric/Behavioral: The patient does not have insomnia.         Objective:     Physical Exam:  Constitutional: Patient is oriented to person, place, and time. Appears well-developed and well-nourished.   HENT:   Head: Normocephalic and atraumatic.   Eyes: Conjunctivae and EOM are normal. Pupils are equal, round, and reactive to light.   Cardiovascular: Normal rate, regular rhythm and normal heart sounds.  Exam reveals no gallop and no friction rub.    No murmur heard.  Pulmonary/Chest: Effort normal and breath sounds normal. Has no wheezes.   Musculoskeletal: Normal range of motion. Exhibits no edema or tenderness.   Neurological: Patient is alert and oriented to person, place, and time.   Psychiatric: Patient has a normal mood and affect.        Assessment/Plan:     Zorita Pang, MD    1. Type 2 diabetes mellitus without complication, with long-term current use of insulin  - Hemoglobin A1C  - Urine Microalbumin Random  Recommend to continue to push exercise and stay on a good diet . Try to get to the perfect BMI of 25. Start your day with a high fiber diet and  involve the family in your exercise regiment .      2. Essential hypertension  - CBC and  differential  - Comprehensive metabolic panel  Check BP from time to time . If always below 140/90, than BP is considered controlled .If above f/u. Concentrate on exercise and diet .      3. Elevated cholesterol  - Lipid panel  Controlled .

## 2019-02-07 LAB — HEMOGLOBIN A1C
Average Estimated Glucose: 159.9 mg/dL
Hemoglobin A1C: 7.2 % — ABNORMAL HIGH (ref 4.6–5.9)

## 2019-02-18 ENCOUNTER — Other Ambulatory Visit (INDEPENDENT_AMBULATORY_CARE_PROVIDER_SITE_OTHER): Payer: Self-pay | Admitting: Family Medicine

## 2019-02-28 ENCOUNTER — Other Ambulatory Visit (INDEPENDENT_AMBULATORY_CARE_PROVIDER_SITE_OTHER): Payer: Self-pay | Admitting: Family Medicine

## 2019-03-24 ENCOUNTER — Encounter (INDEPENDENT_AMBULATORY_CARE_PROVIDER_SITE_OTHER): Payer: Self-pay

## 2019-03-24 NOTE — Telephone Encounter (Signed)
Null

## 2019-04-17 ENCOUNTER — Other Ambulatory Visit (INDEPENDENT_AMBULATORY_CARE_PROVIDER_SITE_OTHER): Payer: Self-pay | Admitting: Family Medicine

## 2019-05-29 ENCOUNTER — Ambulatory Visit (INDEPENDENT_AMBULATORY_CARE_PROVIDER_SITE_OTHER): Payer: Self-pay | Admitting: Cardiovascular Disease

## 2019-06-02 ENCOUNTER — Encounter (INDEPENDENT_AMBULATORY_CARE_PROVIDER_SITE_OTHER): Payer: Self-pay | Admitting: Family Medicine

## 2019-06-30 ENCOUNTER — Other Ambulatory Visit (INDEPENDENT_AMBULATORY_CARE_PROVIDER_SITE_OTHER): Payer: Self-pay | Admitting: Family Medicine

## 2019-08-08 ENCOUNTER — Encounter (INDEPENDENT_AMBULATORY_CARE_PROVIDER_SITE_OTHER): Payer: Self-pay | Admitting: Family Medicine

## 2019-08-24 ENCOUNTER — Telehealth (INDEPENDENT_AMBULATORY_CARE_PROVIDER_SITE_OTHER): Payer: Self-pay | Admitting: Family Medicine

## 2019-08-24 DIAGNOSIS — Z794 Long term (current) use of insulin: Secondary | ICD-10-CM

## 2019-08-24 DIAGNOSIS — E119 Type 2 diabetes mellitus without complications: Secondary | ICD-10-CM

## 2019-08-24 MED ORDER — LANTUS SOLOSTAR 100 UNIT/ML SC SOPN
PEN_INJECTOR | SUBCUTANEOUS | 3 refills | Status: DC
Start: 2019-08-24 — End: 2020-01-26

## 2019-08-24 NOTE — Telephone Encounter (Signed)
Refilled medication as requested.

## 2019-08-24 NOTE — Telephone Encounter (Signed)
Hey Dr. Altamease Oiler,     Pharmacy request for Insulin Glargine 100 Unit/ML injection pen for pt. If we can refill can we send to pharmacy on file.     Thanks

## 2019-09-22 ENCOUNTER — Telehealth (INDEPENDENT_AMBULATORY_CARE_PROVIDER_SITE_OTHER): Payer: Self-pay | Admitting: Family Medicine

## 2019-09-22 MED ORDER — FUROSEMIDE 40 MG PO TABS
ORAL_TABLET | ORAL | 2 refills | Status: DC
Start: 2019-09-22 — End: 2020-03-06

## 2019-09-22 NOTE — Telephone Encounter (Signed)
Refilled medication as requested.

## 2019-09-22 NOTE — Telephone Encounter (Signed)
Last office visit: 01/2019  Rx last sent: 07/01/2019. 60 tab. 0 refills  Next appointment: none

## 2019-09-22 NOTE — Telephone Encounter (Signed)
Patient called requesting refill for RX furosemide Lasix 40 mg tablet.    Please call patient when called to the pharmacy.

## 2019-10-23 ENCOUNTER — Telehealth (INDEPENDENT_AMBULATORY_CARE_PROVIDER_SITE_OTHER): Payer: Self-pay | Admitting: Family Medicine

## 2019-10-23 ENCOUNTER — Other Ambulatory Visit (INDEPENDENT_AMBULATORY_CARE_PROVIDER_SITE_OTHER): Payer: Self-pay | Admitting: Family Medicine

## 2019-10-23 DIAGNOSIS — I25700 Atherosclerosis of coronary artery bypass graft(s), unspecified, with unstable angina pectoris: Secondary | ICD-10-CM

## 2019-10-23 MED ORDER — SACUBITRIL-VALSARTAN 97-103 MG PO TABS
1.0000 | ORAL_TABLET | Freq: Two times a day (BID) | ORAL | 3 refills | Status: DC
Start: 2019-10-23 — End: 2020-11-19

## 2019-10-23 NOTE — Telephone Encounter (Signed)
Refilled all medicationes as requested .  Thank you .

## 2019-10-23 NOTE — Telephone Encounter (Signed)
Good Afternoon Dr Altamease Oiler,    Pt called asking for a refill of     Entresto 97-103 mg tablets    I didn't see the medication on the Pt chart. Please advise.

## 2019-10-30 ENCOUNTER — Ambulatory Visit (INDEPENDENT_AMBULATORY_CARE_PROVIDER_SITE_OTHER): Payer: Commercial Managed Care - POS | Admitting: Family Medicine

## 2019-10-30 ENCOUNTER — Encounter (INDEPENDENT_AMBULATORY_CARE_PROVIDER_SITE_OTHER): Payer: Self-pay | Admitting: Family Medicine

## 2019-10-30 VITALS — BP 123/66 | HR 67 | Temp 97.0°F | Resp 18 | Wt 223.0 lb

## 2019-10-30 DIAGNOSIS — I509 Heart failure, unspecified: Secondary | ICD-10-CM

## 2019-10-30 DIAGNOSIS — Z794 Long term (current) use of insulin: Secondary | ICD-10-CM

## 2019-10-30 DIAGNOSIS — E118 Type 2 diabetes mellitus with unspecified complications: Secondary | ICD-10-CM

## 2019-10-30 DIAGNOSIS — F339 Major depressive disorder, recurrent, unspecified: Secondary | ICD-10-CM

## 2019-10-30 MED ORDER — PAROXETINE HCL 20 MG PO TABS
20.0000 mg | ORAL_TABLET | Freq: Every morning | ORAL | 11 refills | Status: DC
Start: 2019-10-30 — End: 2022-07-24

## 2019-10-30 NOTE — Progress Notes (Signed)
Have you seen any specialists/other providers since your last visit with us?    Yes    Arm preference verified?   Yes    The patient is due for eye exam and foot exam

## 2019-10-30 NOTE — Progress Notes (Signed)
Subjective:      Date: 10/30/2019 4:34 PM   Patient ID: [name] [name] is a 74 y.o. male.    Chief Complaint:  Chief Complaint   Patient presents with   . Depression     and hx of biploar disorder        HPI:  HPI    Patient is currently problems with depression.  He has a history Lee of bipolar disorder.  But in the past he went himself off of all medication and was very stable but currently with everything going on in the world and with his problem with his diabetes and congestive heart failure patient feels depressed he has problems getting out of bed in the morning he does not enjoy things anymore he enjoyed before.  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  I will start him on Paxil and will also send him to our psychologist for further treatment.          Problem List:  Patient Active Problem List   Diagnosis   . Chest pain   . Type 2 diabetes mellitus with complication, with long-term current use of insulin   . Heart attack   . CAD (coronary artery disease)   . Non-ischemic cardiomyopathy   . Acute exacerbation of CHF (congestive heart failure)   . PUD (peptic ulcer disease)   . Elevated LFTs   . Acute dyspnea   . Elevated d-dimer   . CHF (congestive heart failure)   . Headaches due to old head injury   . Essential hypertension   . Pneumonia due to infectious organism, unspecified laterality, unspecified part of lung   . Syncope and collapse   . AICD (automatic cardioverter/defibrillator) present   . Syncope, unspecified syncope type   . Pneumonia due to infectious organism   . Dyspnea, unspecified type   . Cardiomyopathy   . Cough   . Sepsis   . AKI (acute kidney injury)   . Sepsis, due to unspecified organism   . SOB (shortness of breath)   . Acute on chronic systolic CHF (congestive heart failure)   . History of colon polyps   . Gastroesophageal reflux disease, esophagitis presence not specified       Current Medications:  Outpatient Medications Marked as Taking for  the 10/30/19 encounter (Office Visit) with Zorita Pang, MD   Medication Sig Dispense Refill   . ALPRAZolam (XANAX) 0.25 MG tablet Take 0.25 mg by mouth nightly as needed     . aspirin EC 81 MG EC tablet Take 81 mg by mouth daily     . atorvastatin (LIPITOR) 40 MG tablet Take 1 tablet (40 mg total) by mouth nightly. (Patient taking differently: Take 40 mg by mouth every morning    ) 30 tablet 0   . budesonide-formoterol (SYMBICORT) 80-4.5 MCG/ACT inhaler Symbicort 80 mcg-4.5 mcg/actuation HFA aerosol inhaler   INHALE 2 PUFFS PO BID. 6 Inhaler 3   . carvedilol (COREG) 25 MG tablet TAKE 1 TABLET(25 MG) BY MOUTH TWICE DAILY WITH MEALS 180 tablet 3   . docusate sodium (COLACE) 100 MG capsule Take 1 capsule (100 mg total) by mouth 2 (two) times daily.     . furosemide (LASIX) 40 MG tablet TAKE 2 TABLETS(80 MG) BY MOUTH TWICE DAILY 60 tablet 2   . glucose blood (ONE TOUCH ULTRA TEST) test strip 1 each by Other route 2 (two) times daily Use as instructed 300 each 1   .  insulin glargine (LANTUS SOLOSTAR) 100 UNIT/ML injection pen ADMINISTER 50 UNITS UNDER THE SKIN AT BEDTIME PER PATIENT. 45 mL 3   . JANUMET 50-1000 MG tablet TAKE 1 TABLET BY MOUTH TWICE DAILY WITH FOOD. 180 tablet 3   . niacin (NIASPAN) 500 MG CR tablet Take 1 tablet (500 mg total) by mouth 2 (two) times daily.niacin ER 500 mg tablet,extended release 24 hr 180 tablet 3   . polyethylene glycol (MIRALAX) packet Take 17 g by mouth daily as needed (constipation). (Patient taking differently: Take 17 g by mouth every morning    ) 24 each 0   . sacubitril-valsartan (ENTRESTO) 97-103 MG Tab per tablet Take 1 tablet by mouth 2 (two) times daily 180 tablet 3   . tamsulosin (FLOMAX) 0.4 MG Cap TAKE 1 CAPSULE(0.4 MG) BY MOUTH DAILY AFTER DINNER 90 capsule 3   . [DISCONTINUED] amitriptyline (ELAVIL) 25 MG tablet amitriptyline 25 mg tablet   Take 1 tablet every EVENING by oral route.         Allergies:  No Known Allergies    Past Medical History:  Past Medical History:    Diagnosis Date   . Acute CHF     NOV  & DEC 2018, - DIALYSIS CATH PLACED Aug 24 2017 TO REMOVE FLUID    . Anxiety    . CHF (congestive heart failure) 08/12/2014, 2013   . Chronic obstructive pulmonary disease     POSSIBLE PER PT HE USES  SYNBICORT BID ABD  BREO INHALER PRN   . Coronary artery disease 2011    CORONARY STENT PLACEMENT FOLLOWED BY [facility]   . Depression    . Heart attack 2011    and 2014   . Hyperlipemia    . Hypertension    . ICD (implantable 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     PLACED 2014  [facility]  LAST INTERROGATION April 01 2018 REPORT REQUESTED   . Ischemic cardiomyopathy     EF 15% ON ECHO 09-20-2017 DR GARG IN [facility]   . Pacemaker 2014    [employer]TR[employer]IC ICD/PACEMAKER COMBO LAST INTERROGATION  12-12 2018   . Pneumonia 09/2016   . Primary cardiomyopathy     Ischemic cardiomyopathy EF 15% ON ECHO 09-20-2017 DR GARG IN EP[facility]   . Syncope and collapse     PRIOR TO ICD/PACEMAKER PLACED 2014   . Type 2 diabetes mellitus, controlled DX 1998    BS  AVG 100  A1C 7.4 Dec 27 2017   . Wheeze     PT SEE HIS PMD April 01 2018 RE THIS-TOLD TO SEE PMD BY [facility] JUNE 12 WHEN THEY SAW HIM FOR A CL       Past Surgical History:  Past Surgical History:   Procedure Laterality Date   . CARDIAC CATHETERIZATION  2011    CORONARY STENT PLACEMENT X 2 PER PT   . CARDIAC DEFIBRILLATOR PLACEMENT  2014    [facility]   . CARDIAC PACEMAKER PLACEMENT  2014    [employer]TR[employer]IC ICD/PACEMAKER PLACED [facility]   . CIRCUMCISION  AGE 35   . COLONOSCOPY  2009   . duodenal ulcer  1973    STRESS RELATED IN SCHOOL   . EGD N/A 08/15/2014    Procedure: EGD;  Surgeon: Colon Branch, MD;  Location: [facility];  Service: Gastroenterology;  Laterality: N/A;  egd w/ bx   . EGD  1975   . EGD, COLONOSCOPY N/A 04/04/2018    Procedure: EGD with bxs,  COLONOSCOPY with polypectomy and clipping;  Surgeon: Doyne Keel, MD;  Location: [facility];  Service: Gastroenterology;  Laterality: N/A;   . FRACTURE SURGERY  AGE 20     RT  ANKLE- CAST  APPLIED   . HERNIA REPAIR  AGE 13    LEFT INGUINAL HERNIA   . orthopedic surgery  AGE 8    RT foot corrective - HAMMERTOE   . TONSILLECTOMY AND ADENOIDECTOMY  1954       Family History:  Family History   Problem Relation Age of Onset   . Breast cancer Mother    . Heart attack Mother 13   . Hypertension Mother    . Diabetes Mother    . Diabetes Brother    . Heart attack Father    . Hypertension Father        Social History:  Social History     Socioeconomic History   . Marital status: Married     Spouse name: Lajoyce Corners   . Number of children: 0   . Years of education: Not on file   . Highest education level: Not on file   Occupational History   . 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 Financial resource strain: Not on file   . Food insecurity     Worry: Not on file     Inability: Not on file   . Transportation needs     Medical: Not on file     Non-medical: Not on file   Tobacco Use   . Smoking status: Never Smoker   . Smokeless tobacco: Never Used   Substance and Sexual Activity   . Alcohol use: No     Alcohol/week: 0.0 standard drinks   . Drug use: No   . Sexual activity: Yes     Partners: Female   Lifestyle   . Physical activity     Days per week: Not on file     Minutes per session: Not on file   . Stress: Not on file   Relationships   . Social Wellsite geologist on phone: Not on file     Gets together: Not on file     Attends religious service: Not on file     Active member of club or organization: Not on file     Attends meetings of clubs or organizations: Not on file     Relationship status: Not on file   . Intimate partner violence     Fear of current or ex partner: Not on file     Emotionally abused: Not on file     Physically abused: Not on file     Forced sexual activity: Not on file   Other Topics Concern   . Not on file   Social History Narrative   . Not on file       The following sections were reviewed this encounter by the provider:        Vitals:  BP 123/66 (BP Site: Right arm,  Patient Position: Sitting, Cuff Size: Large)   Pulse 67   Temp 97 F (36.1 C) (Tympanic)   Resp 18   Wt 101.2 kg (223 lb)   SpO2 99%   BMI 32.00 kg/m     ROS:  Constitutional: Negative for fever, chills and malaise/fatigue.   Respiratory: Negative for cough, shortness of breath and wheezing.    Cardiovascular: Negative for chest pain and palpitations.  Gastrointestinal: Negative for abdominal pain, diarrhea and constipation.   Musculoskeletal: Negative for joint pain.   Skin: Negative for rash.   Neurological: Negative for dizziness and headaches.   Psychiatric/Behavioral: The patient does not have insomnia.         Objective:     Physical Exam:  Constitutional: Patient is oriented to person, place, and time. Appears well-developed and well-nourished.   HENT:   Head: Normocephalic and atraumatic.   Eyes: Conjunctivae and EOM are normal. Pupils are equal, round, and reactive to light.   Cardiovascular: Normal rate, regular rhythm and normal heart sounds.  Exam reveals no gallop and no friction rub.    No murmur heard.  Pulmonary/Chest: Effort normal and breath sounds normal. Has no wheezes.   Musculoskeletal: Normal range of motion. Exhibits no edema or tenderness.   Neurological: Patient is alert and oriented to person, place, and time.   Psychiatric: Patient has a normal mood and affect.        Assessment/Plan:       1. Episode of recurrent major depressive disorder, unspecified depression episode severity  - PARoxetine (Paxil) 20 MG tablet; Take 1 tablet (20 mg total) by mouth every morning  Dispense: 30 tablet; Refill: 11  - Ambulatory referral to Behavioral Health    2. Acute on chronic congestive heart failure, unspecified heart failure type  Stable     3. Type 2 diabetes mellitus with complication, with long-term current use of insulin  Stable       Zorita Pang, MD

## 2019-11-20 ENCOUNTER — Encounter (INDEPENDENT_AMBULATORY_CARE_PROVIDER_SITE_OTHER): Payer: Self-pay

## 2019-11-30 ENCOUNTER — Encounter (INDEPENDENT_AMBULATORY_CARE_PROVIDER_SITE_OTHER): Payer: Self-pay | Admitting: Family Medicine

## 2019-11-30 ENCOUNTER — Ambulatory Visit (INDEPENDENT_AMBULATORY_CARE_PROVIDER_SITE_OTHER): Payer: Commercial Managed Care - POS | Admitting: Family Medicine

## 2019-11-30 VITALS — BP 125/72 | HR 85 | Temp 97.4°F | Resp 16 | Wt 220.2 lb

## 2019-11-30 DIAGNOSIS — E118 Type 2 diabetes mellitus with unspecified complications: Secondary | ICD-10-CM

## 2019-11-30 DIAGNOSIS — Z0000 Encounter for general adult medical examination without abnormal findings: Secondary | ICD-10-CM

## 2019-11-30 DIAGNOSIS — Z794 Long term (current) use of insulin: Secondary | ICD-10-CM

## 2019-11-30 DIAGNOSIS — D72820 Lymphocytosis (symptomatic): Secondary | ICD-10-CM

## 2019-11-30 LAB — MICROALBUMIN, RANDOM URINE
Urine Creatinine, Random: 254.9 mg/dL
Urine Microalbumin, Random: 43 — ABNORMAL HIGH (ref 0.0–30.0)
Urine Microalbumin/Creatinine Ratio: 17 ug/mg (ref 0–30)

## 2019-11-30 LAB — MAN DIFF ONLY
Atypical Lymphocytes %: 6 %
Atypical Lymphocytes Absolute: 0.87 10*3/uL — ABNORMAL HIGH (ref 0.00–0.00)
Band Neutrophils Absolute: 0 10*3/uL (ref 0.00–1.00)
Band Neutrophils: 0 %
Basophils Absolute Manual: 0 10*3/uL (ref 0.00–0.08)
Basophils Manual: 0 %
Eosinophils Absolute Manual: 0.43 10*3/uL (ref 0.00–0.44)
Eosinophils Manual: 3 %
Lymphocytes Absolute Manual: 8.94 10*3/uL — ABNORMAL HIGH (ref 0.42–3.22)
Lymphocytes Manual: 62 %
Monocytes Absolute: 0.72 10*3/uL (ref 0.21–0.85)
Monocytes Manual: 5 %
Neutrophils Absolute Manual: 3.46 10*3/uL (ref 1.10–6.33)
Segmented Neutrophils: 24 %

## 2019-11-30 LAB — COMPREHENSIVE METABOLIC PANEL
ALT: 15 U/L (ref 0–55)
AST (SGOT): 14 U/L (ref 5–34)
Albumin/Globulin Ratio: 1.5 (ref 0.9–2.2)
Albumin: 4.6 g/dL (ref 3.5–5.0)
Alkaline Phosphatase: 108 U/L — ABNORMAL HIGH (ref 38–106)
Anion Gap: 12 (ref 5.0–15.0)
BUN: 21 mg/dL (ref 9.0–28.0)
Bilirubin, Total: 0.9 mg/dL (ref 0.2–1.2)
CO2: 26 mEq/L (ref 21–29)
Calcium: 9.3 mg/dL (ref 7.9–10.2)
Chloride: 103 mEq/L (ref 100–111)
Creatinine: 1.4 mg/dL (ref 0.5–1.5)
Globulin: 3 g/dL (ref 2.0–3.7)
Glucose: 75 mg/dL (ref 70–100)
Potassium: 4.8 mEq/L (ref 3.5–5.1)
Protein, Total: 7.6 g/dL (ref 6.0–8.3)
Sodium: 141 mEq/L (ref 136–145)

## 2019-11-30 LAB — LIPID PANEL
Cholesterol / HDL Ratio: 3.2
Cholesterol: 119 mg/dL (ref 0–199)
HDL: 37 mg/dL — ABNORMAL LOW (ref 40–9999)
LDL Calculated: 65 mg/dL (ref 0–99)
Triglycerides: 85 mg/dL (ref 34–149)
VLDL Calculated: 17 mg/dL (ref 10–40)

## 2019-11-30 LAB — CBC AND DIFFERENTIAL
Absolute NRBC: 0 10*3/uL (ref 0.00–0.00)
Hematocrit: 49.7 % — ABNORMAL HIGH (ref 37.6–49.6)
Hgb: 15.7 g/dL (ref 12.5–17.1)
MCH: 31.3 pg (ref 25.1–33.5)
MCHC: 31.6 g/dL (ref 31.5–35.8)
MCV: 99.2 fL — ABNORMAL HIGH (ref 78.0–96.0)
MPV: 12.9 fL — ABNORMAL HIGH (ref 8.9–12.5)
Nucleated RBC: 0 /100 WBC (ref 0.0–0.0)
Platelets: 154 10*3/uL (ref 142–346)
RBC: 5.01 10*6/uL (ref 4.20–5.90)
RDW: 14 % (ref 11–15)
WBC: 14.42 10*3/uL — ABNORMAL HIGH (ref 3.10–9.50)

## 2019-11-30 LAB — CELL MORPHOLOGY
Cell Morphology: NORMAL
Platelet Estimate: NORMAL

## 2019-11-30 LAB — HEMOGLOBIN A1C
Average Estimated Glucose: 171.4 mg/dL
Hemoglobin A1C: 7.6 % — ABNORMAL HIGH (ref 4.6–5.9)

## 2019-11-30 LAB — HEMOLYSIS INDEX: Hemolysis Index: 9 (ref 0–18)

## 2019-11-30 LAB — GFR: EGFR: >60

## 2019-11-30 NOTE — Progress Notes (Signed)
Date: 11/30/2019 8:33 AM   Patient ID: [name] [name] is a 74 y.o. male.         Have you seen any specialists/other providers since your last visit with [place]?    Yes    Arm preference verified?   Yes    The patient is due for foot exam    Subjective:      Chief Complaint:  Chief Complaint   Patient presents with   . Annual Exam       HPI:  Visit Type: Health Maintenance Visit  Work Status: working part-time, Runner, 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Reported Health: good health  Reported Diet: compliant with recommended diet  Reported Exercise: occasionally, 1-2x/week and walking  Dental: regular dental visits twice a year  Vision: glasses and regular eye exams   Hearing: slightly decreased hearing  Immunization Status: immunizations up to date  Reproductive Health: sexually active  Prior Screening Tests: last PSA in 2020 and last colonoscopy in 2019  General Health Risks: no family history of prostate cancer, no family history of colon cancer, family history of breast cancer and mother had breast cancer  Safety Elements Used: uses seat belts, smoke detectors in household and does not text and drive  Patient feels overall great, is exercising at least 3 times a week and is on a good diet.        Problem List:  Patient Active Problem List   Diagnosis   . Chest pain   . Type 2 diabetes mellitus with complication, with long-term current use of insulin   . Heart attack   . CAD (coronary artery disease)   . Non-ischemic cardiomyopathy   . Acute exacerbation of CHF (congestive heart failure)   . PUD (peptic ulcer disease)   . Elevated LFTs   . Acute dyspnea   . Elevated d-dimer   . CHF (congestive heart failure)   . Headaches due to old head injury   . Essential hypertension   . Pneumonia due to infectious organism, unspecified laterality, unspecified part of lung   . Syncope and collapse   . AICD (automatic cardioverter/defibrillator) present   . Syncope, unspecified syncope type   . Pneumonia due to infectious organism   . Dyspnea, unspecified  type   . Cardiomyopathy   . Cough   . Sepsis   . AKI (acute kidney injury)   . Sepsis, due to unspecified organism   . SOB (shortness of breath)   . Acute on chronic systolic CHF (congestive heart failure)   . History of colon polyps   . Gastroesophageal reflux disease, esophagitis presence not specified       Current Medications:  Outpatient Medications Marked as Taking for the 11/30/19 encounter (Office Visit) with Zorita Pang, MD   Medication Sig Dispense Refill   . ALPRAZolam (XANAX) 0.25 MG tablet Take 0.25 mg by mouth nightly as needed     . aspirin EC 81 MG EC tablet Take 81 mg by mouth daily     . atorvastatin (LIPITOR) 40 MG tablet Take 1 tablet (40 mg total) by mouth nightly. (Patient taking differently: Take 40 mg by mouth every morning    ) 30 tablet 0   . budesonide-formoterol (SYMBICORT) 80-4.5 MCG/ACT inhaler Symbicort 80 mcg-4.5 mcg/actuation HFA aerosol inhaler   INHALE 2 PUFFS PO BID. 6 Inhaler 3   . carvedilol (COREG) 25 MG tablet TAKE 1 TABLET(25 MG) BY MOUTH TWICE DAILY WITH MEALS 180 tablet 3   . docusate sodium (COLACE) 100 MG  capsule Take 1 capsule (100 mg total) by mouth 2 (two) times daily.     . furosemide (LASIX) 40 MG tablet TAKE 2 TABLETS(80 MG) BY MOUTH TWICE DAILY 60 tablet 2   . insulin glargine (LANTUS SOLOSTAR) 100 UNIT/ML injection pen ADMINISTER 50 UNITS UNDER THE SKIN AT BEDTIME PER PATIENT. 45 mL 3   . JANUMET 50-1000 MG tablet TAKE 1 TABLET BY MOUTH TWICE DAILY WITH FOOD. 180 tablet 3   . niacin (NIASPAN) 500 MG CR tablet Take 1 tablet (500 mg total) by mouth 2 (two) times daily.niacin ER 500 mg tablet,extended release 24 hr 180 tablet 3   . PARoxetine (Paxil) 20 MG tablet Take 1 tablet (20 mg total) by mouth every morning 30 tablet 11   . sacubitril-valsartan (ENTRESTO) 97-103 MG Tab per tablet Take 1 tablet by mouth 2 (two) times daily 180 tablet 3   . tamsulosin (FLOMAX) 0.4 MG Cap TAKE 1 CAPSULE(0.4 MG) BY MOUTH DAILY AFTER DINNER 90 capsule 3       Allergies:  No Known  Allergies    Past Medical History:  Past Medical History:   Diagnosis Date   . Acute CHF     NOV  & DEC 2018, - DIALYSIS CATH PLACED Aug 24 2017 TO REMOVE FLUID    . Anxiety    . CHF (congestive heart failure) 08/12/2014, 2013   . Chronic obstructive pulmonary disease     POSSIBLE PER PT HE USES  SYNBICORT BID ABD  BREO INHALER PRN   . Coronary artery disease 2011    CORONARY STENT PLACEMENT FOLLOWED BY [facility]   . Depression    . Heart attack 2011    and 2014   . Hyperlipemia    . Hypertension    . ICD (implantable 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     PLACED 2014  [facility]  LAST INTERROGATION April 01 2018 REPORT REQUESTED   . Ischemic cardiomyopathy     EF 15% ON ECHO 09-20-2017 DR GARG IN [facility]   . Pacemaker 2014    [employer] ICD/PACEMAKER COMBO LAST INTERROGATION  12-12 2018   . Pneumonia 09/2016   . Primary cardiomyopathy     Ischemic cardiomyopathy EF 15% ON ECHO 09-20-2017 DR GARG IN EP[facility]   . Syncope and collapse     PRIOR TO ICD/PACEMAKER PLACED 2014   . Type 2 diabetes mellitus, controlled DX 1998    BS  AVG 100  A1C 7.4 Dec 27 2017   . Wheeze     PT SEE HIS PMD April 01 2018 RE THIS-TOLD TO SEE PMD BY [facility] JUNE 12 WHEN THEY SAW HIM FOR A CL       Past Surgical History:  Past Surgical History:   Procedure Laterality Date   . CARDIAC CATHETERIZATION  2011    CORONARY STENT PLACEMENT X 2 PER PT   . CARDIAC DEFIBRILLATOR PLACEMENT  2014    [facility]   . CARDIAC PACEMAKER PLACEMENT  2014    [employer] ICD/PACEMAKER PLACED [facility]   . CIRCUMCISION  AGE 32   . COLONOSCOPY  2009   . duodenal ulcer  1973    STRESS RELATED IN SCHOOL   . EGD N/A 08/15/2014    Procedure: EGD;  Surgeon: Colon Branch, MD;  [facility];  Service: Gastroenterology;  Laterality: N/A;  egd w/ bx   . EGD  1975   . EGD, COLONOSCOPY N/A 04/04/2018    Procedure: EGD with bxs,  COLONOSCOPY with polypectomy and clipping;  Surgeon: Doyne Keel, MD;  Location: [facility];  Service: Gastroenterology;   Laterality: N/A;   . FRACTURE SURGERY  AGE 89     RT  ANKLE- CAST APPLIED   . HERNIA REPAIR  AGE 70    LEFT INGUINAL HERNIA   . orthopedic surgery  AGE 48    RT foot corrective - HAMMERTOE   . TONSILLECTOMY AND ADENOIDECTOMY  1954       Family History:  Family History   Problem Relation Age of Onset   . Breast cancer Mother    . Heart attack Mother 22   . Hypertension Mother    . Diabetes Mother    . Diabetes Brother    . Heart attack Father    . Hypertension Father        Social History:  Social History     Tobacco Use   . Smoking status: Never Smoker   . Smokeless tobacco: Never Used   Substance Use Topics   . Alcohol use: No     Alcohol/week: 0.0 standard drinks   . Drug use: No       The following sections were reviewed this encounter by the provider:   Tobacco  Allergies  Meds  Problems  Med Hx  Surg Hx  Fam Hx         Vitals:  BP 125/72 (BP Site: Right arm, Patient Position: Sitting, Cuff Size: Large)   Pulse 85   Temp 97.4 F (36.3 C) (Temporal)   Resp 16   Wt 99.9 kg (220 lb 3.2 oz)   SpO2 95%   BMI 31.60 kg/m      ROS:   General/Constitutional:   Denies Change in appetite. Denies Chills. Denies Fatigue. Denies Fever. Denies Weight gain. Denies Weight loss.   Ophthalmologic:   Denies Blurred vision. Denies Diminished visual acuity. Denies Eye Pain.   ENT:   Denies Hearing Loss. Denies Nasal Discharge. Denies Hoarseness. Denies Ear pain. Denies Nosebleed. Denies Sinus pain. Denies Sore throat. Denies Sneezing.   Endocrine:   Denies Polydipsia. Denies Polyuria.   Cardiovascular:   Denies Chest pain. Denies Chest pain with exertion. Denies Leg Claudication. Denies Palpitations. Denies Swelling in hands/feet.   Respiratory:   Denies Paroxysmal Nocturnal Dyspnea. Denies Cough. Denies Orthopnea. Denies Shortness of breath. Denies Daytime Hypersomnolence. Denies Snoring. Denies Witness Apnea. Denies Wheezing.   Gastrointestinal:   Denies Abdominal pain. Denies Blood in stool. Denies Constipation.  Denies Diarrhea. Denies Heartburn. Denies Nausea. Denies Vomiting.   Hematology:   Denies Easy bruising. Denies Easy Bleeding.   Genitourinary:   Denies Blood in urine. Denies Nocturia.   Musculoskeletal:   Denies Joint pain. Denies Leg cramps. Denies Weakness in LE. Denies Swollen joints.   Peripheral Vascular:   Denies Cold extremities. Denies Decreased sensation in extremities. Denies Painful extremities.   Skin:   Denies Itching. Denies Change in Mole(s). Denies Rash.   Neurologic:   Denies Balance difficulty. Denies Dizziness. Denies Headache. Denies Pre-Syncope. Denies Memory loss.   Psychiatric:   Denies Anxiety. Denies Depressed mood. Denies Difficulty sleeping. Denies Mood Swings.     Objective:     Examination:   General Examination:   GENERAL APPEARANCE: alert, in no acute distress, well developed, well nourished, oriented to time, place, and person.   HEAD: normal appearance, atraumatic.   EYES: extraocular movement intact (EOMI), pupils equal, round, reactive to light and accommodation, sclera anicteric, conjunctiva clear.   EARS:  tympanic membranes normal bilaterally, external canals normal .   NOSE: normal nasal mucosa, no lesions.   ORAL CAVITY: normal oropharynx, normal lips, mucosa moist, no lesions.   THROAT: normal appearance, clear, no erythema.   NECK/THYROID: neck supple, no carotid bruit, carotid pulse 2+ bilaterally, no cervical lymphadenopathy, no neck mass palpated, no jugular venous distention, no thyromegaly.   LYMPH NODES: no palpable adenopathy.   SKIN: good turgor, no rashes, no suspicious lesions.   HEART: S1, S2 normal, no murmurs, rubs, gallops, regular rate and rhythm.   LUNGS: normal effort / no distress, normal breath sounds, clear to auscultation bilaterally, no wheezes, rales, rhonchi.   ABDOMEN: bowel sounds present, no hepatosplenomegaly, soft, nontender, nondistended.   RECTAL: not examined.   MALE GENITOURINARY: no penile lesions or discharge, no testicular mass.    MUSCULOSKELETAL: full range of motion, no swelling or deformity.   EXTREMITIES: no edema, no clubbing, cyanosis, or edema.   PERIPHERAL PULSES: 2+ dorsalis pedis, 2+ posterior tibial.   NEUROLOGIC: nonfocal, cranial nerves 2-12 grossly intact, deep tendon reflexes 2+ symmetrical, normal strength, tone and reflexes, sensory exam intact.   PSYCH: cognitive function intact, mood/affect full range, speech clear.     Assessment/Plan:       1. Type 2 diabetes mellitus with complication, with long-term current use of insulin  - Ambulatory referral to Nutrition Services    2. Well adult exam  - CBC and differential  - Comprehensive metabolic panel  - Lipid panel  - Hemoglobin A1C  - Urine Microalbumin Random  Recommend to continue to push exercise and stay on a good diet . Try to get to the perfect BMI of 25. Start your day with a high fiber diet and  involve the family in your exercise regiment .

## 2019-12-01 LAB — CBC PATHOLOGIST REVIEW

## 2019-12-02 ENCOUNTER — Other Ambulatory Visit (INDEPENDENT_AMBULATORY_CARE_PROVIDER_SITE_OTHER): Payer: Self-pay | Admitting: Family Medicine

## 2019-12-02 DIAGNOSIS — R7989 Other specified abnormal findings of blood chemistry: Secondary | ICD-10-CM

## 2019-12-02 NOTE — Progress Notes (Signed)
Excellent results . All results are in normal range, except white blood count is elevated.  Recommend to recheck it in 2 weeks.  Please continue current treatment and follow up as discussed with last visit .

## 2019-12-04 ENCOUNTER — Ambulatory Visit (INDEPENDENT_AMBULATORY_CARE_PROVIDER_SITE_OTHER): Payer: Commercial Managed Care - POS | Admitting: Family Medicine

## 2019-12-04 ENCOUNTER — Encounter (INDEPENDENT_AMBULATORY_CARE_PROVIDER_SITE_OTHER): Payer: Self-pay

## 2019-12-07 ENCOUNTER — Ambulatory Visit (INDEPENDENT_AMBULATORY_CARE_PROVIDER_SITE_OTHER): Payer: Commercial Managed Care - POS | Admitting: Family Medicine

## 2019-12-08 ENCOUNTER — Telehealth (INDEPENDENT_AMBULATORY_CARE_PROVIDER_SITE_OTHER): Payer: Self-pay

## 2019-12-08 NOTE — Telephone Encounter (Signed)
Writer attempted to contact pt to discuss appointment options with [facility]. Writer LVM with callback number.

## 2019-12-13 ENCOUNTER — Ambulatory Visit (INDEPENDENT_AMBULATORY_CARE_PROVIDER_SITE_OTHER): Payer: Commercial Managed Care - POS | Admitting: Family Medicine

## 2019-12-13 NOTE — Telephone Encounter (Signed)
Pt did not return call to schedule appointment for therapy and was removed from waitlist. Patient can be re-added if they return call.

## 2019-12-19 ENCOUNTER — Telehealth (INDEPENDENT_AMBULATORY_CARE_PROVIDER_SITE_OTHER): Payer: Self-pay | Admitting: Family Medicine

## 2019-12-19 NOTE — Telephone Encounter (Signed)
Pt has been added to the list to be scheduled when available. I did not see pt when I checked so I added him

## 2019-12-19 NOTE — Telephone Encounter (Signed)
Pt is asking how soon he will be contacted to get a covid vaccine shot .  He wants to verify if he is on the list .  Pls advise        pt info  [phone] (H)    Thank you

## 2019-12-20 ENCOUNTER — Encounter (INDEPENDENT_AMBULATORY_CARE_PROVIDER_SITE_OTHER): Payer: Self-pay

## 2019-12-20 ENCOUNTER — Ambulatory Visit (INDEPENDENT_AMBULATORY_CARE_PROVIDER_SITE_OTHER): Payer: Commercial Managed Care - POS | Admitting: Family Medicine

## 2019-12-21 ENCOUNTER — Encounter (INDEPENDENT_AMBULATORY_CARE_PROVIDER_SITE_OTHER): Payer: Self-pay

## 2019-12-21 ENCOUNTER — Other Ambulatory Visit (INDEPENDENT_AMBULATORY_CARE_PROVIDER_SITE_OTHER): Payer: Self-pay | Admitting: Internal Medicine

## 2019-12-26 ENCOUNTER — Ambulatory Visit (INDEPENDENT_AMBULATORY_CARE_PROVIDER_SITE_OTHER): Payer: Commercial Managed Care - POS | Admitting: Family Medicine

## 2019-12-29 NOTE — Telephone Encounter (Signed)
Patient is following up on the status of the COVID19 vaccines.  Please advise.    [phone]

## 2020-01-05 ENCOUNTER — Ambulatory Visit (INDEPENDENT_AMBULATORY_CARE_PROVIDER_SITE_OTHER): Payer: Commercial Managed Care - POS | Admitting: Family Medicine

## 2020-01-10 ENCOUNTER — Ambulatory Visit (INDEPENDENT_AMBULATORY_CARE_PROVIDER_SITE_OTHER): Payer: Commercial Managed Care - POS | Admitting: Family Medicine

## 2020-01-17 ENCOUNTER — Telehealth (INDEPENDENT_AMBULATORY_CARE_PROVIDER_SITE_OTHER): Payer: Self-pay

## 2020-01-17 NOTE — Telephone Encounter (Signed)
Writer attempted to contact pt to discuss appointment options with [facility]. Writer LVM with callback number.

## 2020-01-26 ENCOUNTER — Ambulatory Visit (INDEPENDENT_AMBULATORY_CARE_PROVIDER_SITE_OTHER): Payer: Commercial Managed Care - POS | Admitting: Family Medicine

## 2020-01-26 ENCOUNTER — Encounter (INDEPENDENT_AMBULATORY_CARE_PROVIDER_SITE_OTHER): Payer: Self-pay | Admitting: Family Medicine

## 2020-01-26 VITALS — BP 124/78 | HR 78 | Temp 97.3°F | Resp 18 | Wt 227.0 lb

## 2020-01-26 DIAGNOSIS — E119 Type 2 diabetes mellitus without complications: Secondary | ICD-10-CM

## 2020-01-26 DIAGNOSIS — J45909 Unspecified asthma, uncomplicated: Secondary | ICD-10-CM

## 2020-01-26 DIAGNOSIS — Z794 Long term (current) use of insulin: Secondary | ICD-10-CM

## 2020-01-26 MED ORDER — LANTUS SOLOSTAR 100 UNIT/ML SC SOPN
PEN_INJECTOR | SUBCUTANEOUS | 3 refills | Status: DC
Start: 2020-01-26 — End: 2021-02-06

## 2020-01-26 MED ORDER — NIACIN ER (ANTIHYPERLIPIDEMIC) 500 MG PO TBCR
500.00 mg | EXTENDED_RELEASE_TABLET | Freq: Two times a day (BID) | ORAL | 3 refills | Status: DC
Start: 2020-01-26 — End: 2021-01-29

## 2020-01-26 MED ORDER — BUDESONIDE-FORMOTEROL FUMARATE 80-4.5 MCG/ACT IN AERO
INHALATION_SPRAY | RESPIRATORY_TRACT | 3 refills | Status: DC
Start: 2020-01-26 — End: 2020-01-26

## 2020-01-26 MED ORDER — BUDESONIDE-FORMOTEROL FUMARATE 80-4.5 MCG/ACT IN AERO
INHALATION_SPRAY | RESPIRATORY_TRACT | 3 refills | Status: DC
Start: 2020-01-26 — End: 2020-02-08

## 2020-01-26 NOTE — Progress Notes (Signed)
Subjective:      Date: 01/26/2020 3:53 PM   Patient ID: [name] [name] is a 74 y.o. male.    Chief Complaint:  Chief Complaint   Patient presents with   . Diabetes Follow-up     Pt needs med refills for Symbicort, Niacin, Lantus        HPI:  HPI    HTN: Hypertension is well controlled at this point.  Patient is checking his blood pressure regularly at least 3-4 times a week.  Blood pressure at home is always in normal range.  Patient avoids adding salt and is doing regular cardiac exercise.  Average 130/80    DM : Patient is compliant with current medication.  Tries to exercise at least 3 times a week.  Watches type of diet and amount of carbohydrate intake.  Check lab work for diabetes regularly in our clinic.      Problem List:  Patient Active Problem List   Diagnosis   . Chest pain   . Type 2 diabetes mellitus with complication, with long-term current use of insulin   . Heart attack   . CAD (coronary artery disease)   . Non-ischemic cardiomyopathy   . Acute exacerbation of CHF (congestive heart failure)   . PUD (peptic ulcer disease)   . Elevated LFTs   . Acute dyspnea   . Elevated d-dimer   . CHF (congestive heart failure)   . Headaches due to old head injury   . Essential hypertension   . Pneumonia due to infectious organism, unspecified laterality, unspecified part of lung   . Syncope and collapse   . AICD (automatic cardioverter/defibrillator) present   . Syncope, unspecified syncope type   . Pneumonia due to infectious organism   . Dyspnea, unspecified type   . Cardiomyopathy   . Cough   . Sepsis   . AKI (acute kidney injury)   . Sepsis, due to unspecified organism   . SOB (shortness of breath)   . Acute on chronic systolic CHF (congestive heart failure)   . History of colon polyps   . Gastroesophageal reflux disease, esophagitis presence not specified       Current Medications:  Outpatient Medications Marked as Taking for the 01/26/20 encounter (Office Visit) with Zorita Pang, MD   Medication Sig  Dispense Refill   . ALPRAZolam (XANAX) 0.25 MG tablet Take 0.25 mg by mouth nightly as needed     . aspirin EC 81 MG EC tablet Take 81 mg by mouth daily     . atorvastatin (LIPITOR) 40 MG tablet Take 1 tablet (40 mg total) by mouth nightly. (Patient taking differently: Take 40 mg by mouth every morning    ) 30 tablet 0   . budesonide-formoterol (Symbicort) 80-4.5 MCG/ACT inhaler Symbicort 80 mcg-4.5 mcg/actuation HFA aerosol inhaler   INHALE 2 PUFFS PO BID. 6 Inhaler 3   . carvedilol (COREG) 25 MG tablet TAKE 1 TABLET(25 MG) BY MOUTH TWICE DAILY WITH MEALS 180 tablet 3   . docusate sodium (COLACE) 100 MG capsule Take 1 capsule (100 mg total) by mouth 2 (two) times daily.     . furosemide (LASIX) 40 MG tablet TAKE 2 TABLETS(80 MG) BY MOUTH TWICE DAILY 60 tablet 2   . insulin glargine (LANTUS SOLOSTAR) 100 UNIT/ML injection pen ADMINISTER 50 UNITS UNDER THE SKIN AT BEDTIME PER PATIENT. 45 mL 3   . JANUMET 50-1000 MG tablet TAKE 1 TABLET BY MOUTH TWICE DAILY WITH FOOD. 180 tablet 3   .  PARoxetine (Paxil) 20 MG tablet Take 1 tablet (20 mg total) by mouth every morning 30 tablet 11   . sacubitril-valsartan (ENTRESTO) 97-103 MG Tab per tablet Take 1 tablet by mouth 2 (two) times daily 180 tablet 3   . tamsulosin (FLOMAX) 0.4 MG Cap TAKE 1 CAPSULE(0.4 MG) BY MOUTH DAILY AFTER DINNER 90 capsule 3   . [DISCONTINUED] budesonide-formoterol (SYMBICORT) 80-4.5 MCG/ACT inhaler Symbicort 80 mcg-4.5 mcg/actuation HFA aerosol inhaler   INHALE 2 PUFFS PO BID. 6 Inhaler 3   . [DISCONTINUED] insulin glargine (LANTUS SOLOSTAR) 100 UNIT/ML injection pen ADMINISTER 50 UNITS UNDER THE SKIN AT BEDTIME PER PATIENT. 45 mL 3       Allergies:  No Known Allergies    Past Medical History:  Past Medical History:   Diagnosis Date   . Acute CHF     NOV  & DEC 2018, - DIALYSIS CATH PLACED Aug 24 2017 TO REMOVE FLUID    . Anxiety    . CHF (congestive heart failure) 08/12/2014, 2013   . Chronic obstructive pulmonary disease     POSSIBLE PER PT HE USES   SYNBICORT BID ABD  BREO INHALER PRN   . Coronary artery disease 2011    CORONARY STENT PLACEMENT FOLLOWED BY [facility]   . Depression    . Heart attack 2011    and 2014   . Hyperlipemia    . Hypertension    . ICD (implantable 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     PLACED 2014  [facility]  LAST INTERROGATION April 01 2018 REPORT REQUESTED   . Ischemic cardiomyopathy     EF 15% ON ECHO 09-20-2017 DR GARG IN [facility]   . Pacemaker 2014    [employer] ICD/PACEMAKER COMBO LAST INTERROGATION  12-12 2018   . Pneumonia 09/2016   . Primary cardiomyopathy     Ischemic cardiomyopathy EF 15% ON ECHO 09-20-2017 DR GARG IN EP[facility]   . Syncope and collapse     PRIOR TO ICD/PACEMAKER PLACED 2014   . Type 2 diabetes mellitus, controlled DX 1998    BS  AVG 100  A1C 7.4 Dec 27 2017   . Wheeze     PT SEE HIS PMD April 01 2018 RE THIS-TOLD TO SEE PMD BY [facility] JUNE 12 WHEN THEY SAW HIM FOR A CL       Past Surgical History:  Past Surgical History:   Procedure Laterality Date   . CARDIAC CATHETERIZATION  2011    CORONARY STENT PLACEMENT X 2 PER PT   . CARDIAC DEFIBRILLATOR PLACEMENT  2014    [facility]   . CARDIAC PACEMAKER PLACEMENT  2014    [employer] ICD/PACEMAKER PLACED [facility]   . CIRCUMCISION  AGE 62   . COLONOSCOPY  2009   . duodenal ulcer  1973    STRESS RELATED IN SCHOOL   . EGD N/A 08/15/2014    Procedure: EGD;  Surgeon: Colon Branch, MD;  [facility];  Service: Gastroenterology;  Laterality: N/A;  egd w/ bx   . EGD  1975   . EGD, COLONOSCOPY N/A 04/04/2018    Procedure: EGD with bxs, COLONOSCOPY with polypectomy and clipping;  Surgeon: Doyne Keel, MD;  Location: [facility];  Service: Gastroenterology;  Laterality: N/A;   . FRACTURE SURGERY  AGE 6     RT  ANKLE- CAST APPLIED   . HERNIA REPAIR  AGE 62    LEFT INGUINAL HERNIA   . orthopedic surgery  AGE  6    RT foot corrective - HAMMERTOE   . TONSILLECTOMY AND ADENOIDECTOMY  1954       Family History:  Family History   Problem Relation Age of Onset   .  Breast cancer Mother    . Heart attack Mother 61   . Hypertension Mother    . Diabetes Mother    . Diabetes Brother    . Heart attack Father    . Hypertension Father        Social History:  Social History     Socioeconomic History   . Marital status: Married     Spouse name: Lajoyce Corners   . Number of children: 0   . Years of education: Not on file   . Highest education level: Not on file   Occupational History   . 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 Smoking status: Never Smoker   . Smokeless tobacco: Never Used   Substance and Sexual Activity   . Alcohol use: No     Alcohol/week: 0.0 standard drinks   . Drug use: No   . Sexual activity: Yes     Partners: Female   Other Topics Concern   . Not on file   Social History Narrative   . Not on file     Social Determinants of Health     Financial Resource Strain:    . Difficulty of Paying Living Expenses:    Food Insecurity:    . Worried About Programme researcher, 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in the Last Year:    . Barista in the Last Year:    Transportation Needs:    . Freight forwarder (Medical):    Marland Kitchen Lack of Transportation (Non-Medical):    Physical Activity:    . Days of Exercise per Week:    . Minutes of Exercise per Session:    Stress:    . Feeling of Stress :    Social Connections:    . Frequency of Communication with Friends and Family:    . Frequency of Social Gatherings with Friends and Family:    . Attends Religious Services:    . Active Member of Clubs or Organizations:    . Attends Banker Meetings:    Marland Kitchen Marital Status:    Intimate Partner Violence:    . Fear of Current or Ex-Partner:    . Emotionally Abused:    Marland Kitchen Physically Abused:    . Sexually Abused:        The following sections were reviewed this encounter by the provider:   Tobacco  Allergies  Meds  Problems  Med Hx  Surg Hx  Fam Hx         Vitals:  BP 124/78 (BP Site: Right arm, Patient Position: Sitting, Cuff Size: Large)   Pulse 78   Temp 97.3 F (36.3 C) (Tympanic)   Resp 18    Wt 103 kg (227 lb)   SpO2 98%   BMI 32.57 kg/m     ROS:  Constitutional: Negative for fever, chills and malaise/fatigue.   Respiratory: Negative for cough, shortness of breath and wheezing.    Cardiovascular: Negative for chest pain and palpitations.   Gastrointestinal: Negative for abdominal pain, diarrhea and constipation.   Musculoskeletal: Negative for joint pain.   Skin: Negative for rash.   Neurological: Negative for dizziness and headaches.   Psychiatric/Behavioral: The patient does not have insomnia.  Objective:     Physical Exam:  Constitutional: Patient is oriented to person, place, and time. Appears well-developed and well-nourished.   HENT:   Head: Normocephalic and atraumatic.   Eyes: Conjunctivae and EOM are normal. Pupils are equal, round, and reactive to light.   Cardiovascular: Normal rate, regular rhythm and normal heart sounds.  Exam reveals no gallop and no friction rub.    No murmur heard.  Pulmonary/Chest: Effort normal and breath sounds normal. Has no wheezes.   Musculoskeletal: Normal range of motion. Exhibits no edema or tenderness.   Neurological: Patient is alert and oriented to person, place, and time.   Psychiatric: Patient has a normal mood and affect.        Assessment/Plan:         2. Type 2 diabetes mellitus without complication, with long-term current use of insulin  - insulin glargine (LANTUS SOLOSTAR) 100 UNIT/ML injection pen; ADMINISTER 50 UNITS UNDER THE SKIN AT BEDTIME PER PATIENT.  Dispense: 45 mL; Refill: 3  Patient symptoms of well controlled with current treatment.  Recommend to continue medication as prescribed.    3. Mild asthma, unspecified whether complicated, unspecified whether persistent  - budesonide-formoterol (Symbicort) 80-4.5 MCG/ACT inhaler; Symbicort 80 mcg-4.5 mcg/actuation HFA aerosol inhaler   INHALE 2 PUFFS PO BID.  Dispense: 6 Inhaler; Refill: 3    HTN:  Patient symptoms of well controlled with current treatment.  Recommend to continue  medication as prescribed.        Zorita Pang, MD

## 2020-01-26 NOTE — Addendum Note (Signed)
Addended byZorita Pang on: 01/26/2020 04:05 PM     Modules accepted: Orders

## 2020-01-26 NOTE — Progress Notes (Signed)
Have you seen any specialists/other providers since your last visit with us?    No    Arm preference verified?   Yes    The patient is due for nothing at this time, HM is up-to-date.

## 2020-01-27 LAB — HEMOGLOBIN A1C
Average Estimated Glucose: 137 mg/dL
Hemoglobin A1C: 6.4 % — ABNORMAL HIGH (ref 4.6–5.9)

## 2020-02-02 ENCOUNTER — Ambulatory Visit (INDEPENDENT_AMBULATORY_CARE_PROVIDER_SITE_OTHER): Payer: Self-pay | Admitting: Cardiovascular Disease

## 2020-02-02 ENCOUNTER — Encounter (INDEPENDENT_AMBULATORY_CARE_PROVIDER_SITE_OTHER): Payer: Self-pay

## 2020-02-07 ENCOUNTER — Telehealth (INDEPENDENT_AMBULATORY_CARE_PROVIDER_SITE_OTHER): Payer: Self-pay

## 2020-02-07 DIAGNOSIS — J45909 Unspecified asthma, uncomplicated: Secondary | ICD-10-CM

## 2020-02-07 NOTE — Telephone Encounter (Signed)
Symbicort 80-4.5 mcg/ACT inhaler is not covered by patient insurance plan. Please prescribe an alternative from the list:    Fluticasone Propion-Salmeterol 250- 50 mcg  Breo Ellipta 100-25 mcg  Dulera 200-5 mcg  Advair HFA 115-21  Flovent HFA 110 mcg  Trelegy Ellipta 100-62.5-25 Mcg

## 2020-02-08 MED ORDER — BUDESONIDE-FORMOTEROL FUMARATE 80-4.5 MCG/ACT IN AERO
INHALATION_SPRAY | RESPIRATORY_TRACT | 3 refills | Status: DC
Start: 2020-02-08 — End: 2020-04-18

## 2020-02-08 NOTE — Telephone Encounter (Signed)
Refilled medication as requested.

## 2020-02-15 ENCOUNTER — Telehealth (INDEPENDENT_AMBULATORY_CARE_PROVIDER_SITE_OTHER): Payer: Self-pay | Admitting: Family Medicine

## 2020-02-15 NOTE — Telephone Encounter (Signed)
Name, strength, directions of requested refill(s):    Breo Ellipta 100-25 mcg    Pharmacy to send refill to or patient to pick up rx from office (mark requested pharmacy in BOLD):      [facility][id number] [place], [place] - [address] AT [facility] OF [address]  [address]  [place] [address]  Phone: [phone] Fax: [phone]    [facility] #[id number] - [place], [place] - [address] AT [facility] OF [address]  [address]  [place] [address]  Phone: [phone] Fax: [phone]        Please mark "X" next to the preferred call back number:    Mobile:   Telephone Information:   Mobile [phone]    x   Home: @HOMEPHONE @    Work: @WORKPHONE @          Next visit: Visit date not found   '

## 2020-02-15 NOTE — Telephone Encounter (Signed)
LOV 01/26/20

## 2020-02-16 MED ORDER — BREO ELLIPTA 100-25 MCG/INH IN AEPB
1.00 | INHALATION_SPRAY | Freq: Every day | RESPIRATORY_TRACT | 3 refills | Status: DC
Start: 2020-02-16 — End: 2021-06-18

## 2020-02-16 NOTE — Telephone Encounter (Signed)
Patient states that he's only using one inhaler at this time.

## 2020-02-16 NOTE — Telephone Encounter (Signed)
This is a new medication.  I have not seen it in the chart.  I refilled it for a year.  But please talk to patient that he is not taking different inhalers at the same time.

## 2020-02-16 NOTE — Telephone Encounter (Signed)
Message left for patient to return call.

## 2020-03-01 ENCOUNTER — Telehealth (INDEPENDENT_AMBULATORY_CARE_PROVIDER_SITE_OTHER): Payer: Self-pay | Admitting: Family Medicine

## 2020-03-01 NOTE — Telephone Encounter (Signed)
Patient needs to be seen for infected right foot (middle toe by the big toe) x 3 weeks and is requesting to be squeezed in for a sooner appointment than available. Please return call at noted phone number below.    Patient is currently scheduled for - 03/07/20.    Patient Preferred Callback Number:  [phone]

## 2020-03-04 NOTE — Telephone Encounter (Signed)
Called and LVM that we can see patient with another provider or in the walk in if he wants to be seen sooner

## 2020-03-05 ENCOUNTER — Encounter (INDEPENDENT_AMBULATORY_CARE_PROVIDER_SITE_OTHER): Payer: Self-pay

## 2020-03-05 DIAGNOSIS — I509 Heart failure, unspecified: Secondary | ICD-10-CM

## 2020-03-06 ENCOUNTER — Encounter (INDEPENDENT_AMBULATORY_CARE_PROVIDER_SITE_OTHER): Payer: Self-pay | Admitting: Cardiovascular Disease

## 2020-03-06 ENCOUNTER — Ambulatory Visit (INDEPENDENT_AMBULATORY_CARE_PROVIDER_SITE_OTHER): Payer: Commercial Managed Care - POS | Admitting: Cardiovascular Disease

## 2020-03-06 VITALS — BP 118/82 | HR 88 | Ht 72.0 in | Wt 220.0 lb

## 2020-03-06 DIAGNOSIS — I10 Essential (primary) hypertension: Secondary | ICD-10-CM

## 2020-03-06 DIAGNOSIS — I5022 Chronic systolic (congestive) heart failure: Secondary | ICD-10-CM

## 2020-03-06 DIAGNOSIS — I428 Other cardiomyopathies: Secondary | ICD-10-CM

## 2020-03-06 MED ORDER — CARVEDILOL 25 MG PO TABS
25.0000 mg | ORAL_TABLET | Freq: Two times a day (BID) | ORAL | 3 refills | Status: DC
Start: 2020-03-06 — End: 2021-04-24

## 2020-03-06 MED ORDER — TORSEMIDE 20 MG PO TABS
40.0000 mg | ORAL_TABLET | Freq: Two times a day (BID) | ORAL | 3 refills | Status: DC
Start: 2020-03-06 — End: 2020-09-26

## 2020-03-06 NOTE — Patient Instructions (Signed)
   Repeat echocardiogram.   Change diuretic to torsemide at 40 mg twice a day (stop furosemide/Lasix).   Fluid restriction to 48 total daily ounces or below.   Close follow-up in 1 month.   Call with any concerns - specifically worsening shortness of breath, continued weight gain, or worsening swelling.

## 2020-03-06 NOTE — Progress Notes (Signed)
[facility] CARDIOLOGY OFFICE PROGRESS NOTE    [facility] OFFICE -CARDIOLOGY  [address]  [place] [address]  Dept: [phone]  Dept Fax: [phone]       Patient Name: [name] [name]    Date of Visit:  Mar 06, 2020  Date of Birth: Oct 29, 1945  AGE: 74 y.o.  Medical Record #: [id number]  Requesting Physician: Zorita Pang, MD      CHIEF COMPLAINT: Congestive Heart Failure      HISTORY OF PRESENT ILLNESS:    He is a pleasant 74 y.o. male who presents today for routine follow-up of his congestive heart failure.    Unfortunately [name] [name] is not doing nearly as well as he has been at most recent visits.  He is having an acute exacerbation of his chronic systolic congestive heart failure.  He has class III exertional dyspnea, progressive over the past 3 weeks.  He is so winded walking up the stairs to his office that he has to rest at his desk for 15 minutes before he can function.  His wife feels that he is disproportionately winded with relatively normal levels of physical activity.  He has orthopnea and mild worsening of his edema.  He has gained 7 pounds since the last visit and has worsening abdominal distention.  There is jugular venous distention on examination as well.  He denies paroxysmal nocturnal dyspnea.  We going to repeat an echocardiogram, change him to a more potent diuretic, get him close follow-up in our congestive heart failure clinic in a month.    The patient does not endorse any chest pain, palpitations, nausea, diaphoresis, light-headedness, dizziness, syncope, or unusual bleeding.      PAST MEDICAL HISTORY: He has a past medical history of Acute CHF, Anxiety, CHF (congestive heart failure) (08/12/2014, 2013), Chronic obstructive pulmonary disease, Coronary artery disease (2011), Depression, Heart attack (2011), Hyperlipemia, Hypertension, ICD (implantable 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, Ischemic cardiomyopathy, Pacemaker  (2014), Pneumonia (09/2016), Primary cardiomyopathy, Syncope and collapse, Type 2 diabetes mellitus, controlled (DX 1998), and Wheeze. He has a past surgical history that includes duodenal ulcer (1973); orthopedic surgery (AGE 63); EGD (N/A, 08/15/2014); Circumcision (AGE 89); Fracture surgery (AGE 64); Hernia repair (AGE 89); Colonoscopy (2009); Tonsillectomy and adenoidectomy (1954); Cardiac defibrillator placement (2014); Cardiac catheterization (2011); Cardiac pacemaker placement (2014); EGD (1975); EGD, COLONOSCOPY (N/A, 04/04/2018); ECHOCARDIOGRAM, TRANSTHORACIC (02/2016 11/2016 09/2017 12/2017); and MPI nuclear study (06/2016).    ALLERGIES: No Known Allergies    MEDICATIONS:   Current Outpatient Medications   Medication Sig    ALPRAZolam (XANAX) 0.25 MG tablet Take 0.25 mg by mouth nightly as needed    aspirin EC 81 MG EC tablet Take 81 mg by mouth daily    atorvastatin (LIPITOR) 40 MG tablet Take 1 tablet (40 mg total) by mouth nightly. (Patient taking differently: Take 40 mg by mouth every morning    )    carvedilol (COREG) 25 MG tablet Take 1 tablet (25 mg total) by mouth 2 (two) times daily with meals    docusate sodium (COLACE) 100 MG capsule Take 1 capsule (100 mg total) by mouth 2 (two) times daily.    fluticasone furoate-vilanterol (Breo Ellipta) 100-25 MCG/INH Aerosol Pwdr, Breath Activated Inhale 1 puff into the lungs daily    JANUMET 50-1000 MG tablet TAKE 1 TABLET BY MOUTH TWICE DAILY WITH FOOD.    niacin (NIASPAN) 500 MG CR tablet Take 1 tablet (500 mg total) by mouth 2 (  two) times daily niacin ER 500 mg tablet,extended release 24 hr    PARoxetine (Paxil) 20 MG tablet Take 1 tablet (20 mg total) by mouth every morning    sacubitril-valsartan (ENTRESTO) 97-103 MG Tab per tablet Take 1 tablet by mouth 2 (two) times daily    tamsulosin (FLOMAX) 0.4 MG Cap TAKE 1 CAPSULE(0.4 MG) BY MOUTH DAILY AFTER DINNER    budesonide-formoterol (Symbicort) 80-4.5 MCG/ACT inhaler Symbicort 80 mcg-4.5  mcg/actuation HFA aerosol inhaler   INHALE 2 PUFFS PO BID.    glucose blood (ONE TOUCH ULTRA TEST) test strip 1 each by Other route 2 (two) times daily Use as instructed    insulin glargine (LANTUS SOLOSTAR) 100 UNIT/ML injection pen ADMINISTER 50 UNITS UNDER THE SKIN AT BEDTIME PER PATIENT.    torsemide (DEMADEX) 20 MG tablet Take 2 tablets (40 mg total) by mouth 2 (two) times daily        FAMILY HISTORY: family history includes Breast cancer in his mother; Coronary artery disease in his mother; Diabetes in his brother and mother; Heart attack in his father; Heart attack (age of onset: 50) in his mother; Hypertension in his father and mother.    SOCIAL HISTORY: He reports that he has never smoked. He has never used smokeless tobacco. He reports that he does not drink alcohol and does not use drugs.    PHYSICAL EXAMINATION    Visit Vitals  BP 118/82 (BP Site: Left arm, Patient Position: Sitting, Cuff Size: Medium)   Pulse 88   Ht 1.829 m (6')   Wt 99.8 kg (220 lb)   BMI 29.84 kg/m       General Appearance:  A well-appearing male in no acute distress.    Skin: Warm and dry to touch, no apparent skin lesions, or masses noted.  Head: Normocephalic, normal hair pattern, no masses or tenderness   Eyes: EOMS Intact, PERRL, conjunctivae and lids unremarkable.  ENT: Ears, Nose and throat reveal no gross abnormalities.  No pallor or cyanosis.  Dentition good.   Neck: +JVD, no carotid bruit, thyroid not enlarged   Chest: Clear to auscultation bilaterally with good air movement and respiratory effort and no wheezes, rales, or rhonchi   Cardiovascular: Regular rhythm, normal rate, 1/6 HSM  Abdomen: Firm, mildly distended, nontender  Extremities: Warm with 1+ BLE edema. No clubbing, or cyanosis. All peripheral pulses are full and equal.   Neuro: Alert and oriented x3. No gross motor or sensory deficits noted, affect appropriate.        Most recent echo and nuclear study reviewed.      IMPRESSION:   [name] is a 74 y.o.  male with the following problems:     Acute on chronic systolic congestive heart failure - [facility] FC III with volume overload on examination.   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   Nonobstructive coronary artery disease by remote catheterization in 2014.    Normal nuclear stress test in September of 2017.     History of neurocardiogenic syncope with no documented ventricular arrhythmias thus far.   Hypertension, adequately controlled.    Type 2 diabetes mellitus, insulin dependent, well-controlled by his report.   Chronic kidney disease, stage 3.      RECOMMENDATIONS:     Repeat echocardiogram.   Augment diuretic potency by switching furosemide over to torsemide at 40 mg twice daily.  Fluid restriction to 48 ounces or below.  We had an extensive discussion about fluid goals today.   Close follow-up in 1 month at our advanced practice provider congestive heart failure clinic or with me pending availability.   Further medication optimization/titration upon euvolemia - the addition of spironolactone to the regimen would be reasonable.                                                     Orders Placed This Encounter   Procedures    Echocardiogram Adult Complete W Clr/ Dopp Waveform    APP Office Visit ([facility] [place])       Orders Placed This Encounter   Medications    torsemide (DEMADEX) 20 MG tablet     Sig: Take 2 tablets (40 mg total) by mouth 2 (two) times daily     Dispense:  360 tablet     Refill:  3     Replaces furosemide    carvedilol (COREG) 25 MG tablet     Sig: Take 1 tablet (25 mg total) by mouth 2 (two) times daily with meals     Dispense:  180 tablet     Refill:  3       SIGNED:    Arcola Jansky, MD          This note was generated by the Dragon speech recognition and may contain errors or omissions not intended by the user.  Grammatical errors, random word insertions, deletions, pronoun errors, and incomplete sentences are occasional consequences of this technology due to software limitations. Not all errors are caught or corrected. If there are questions or concerns about the content of this note or information contained within the body of this dictation, they should be addressed directly with the author for clarification.

## 2020-03-07 ENCOUNTER — Ambulatory Visit (INDEPENDENT_AMBULATORY_CARE_PROVIDER_SITE_OTHER): Payer: Commercial Managed Care - POS | Admitting: Family Medicine

## 2020-03-07 ENCOUNTER — Encounter (INDEPENDENT_AMBULATORY_CARE_PROVIDER_SITE_OTHER): Payer: Self-pay | Admitting: Family Medicine

## 2020-03-07 VITALS — BP 119/71 | HR 80 | Temp 97.1°F | Resp 20 | Ht 72.0 in | Wt 212.6 lb

## 2020-03-07 DIAGNOSIS — L600 Ingrowing nail: Secondary | ICD-10-CM

## 2020-03-07 NOTE — Progress Notes (Signed)
Have you seen any specialists/other providers since your last visit with [place]?    Cardiology- told to go to the PCP for foot eval    Arm preference verified?   Yes    Glucose 90 - 106 range

## 2020-03-07 NOTE — Progress Notes (Signed)
Subjective:      Date: 03/07/2020 2:50 PM   Patient ID: [name] [name] is a 74 y.o. male.    Chief Complaint:  Chief Complaint   Patient presents with   . Foot Pain     Possible infected toe- R foot- Pain x 2 months       HPI:  HPI      Patient has and his forearms told him to toenail on his left foot since he was a baby.  So far the toe never gave him a lot of grief.  Momentary it looks like the nail is very much grown in and patient has to see a podiatrist.  It does not look to me like the toe is infected at this point but I think that the toenail is growing into the foot and has to be removed as soon as possible so that it will not create an infection.  Patient is otherwise symptomatic and on a good diet.          Problem List:  Patient Active Problem List   Diagnosis   . Chest pain   . Type 2 diabetes mellitus with complication, with long-term current use of insulin   . Heart attack   . CAD (coronary artery disease)   . Non-ischemic cardiomyopathy   . Acute exacerbation of CHF (congestive heart failure)   . PUD (peptic ulcer disease)   . Elevated LFTs   . Acute dyspnea   . Elevated d-dimer   . CHF (congestive heart failure)   . Headaches due to old head injury   . Essential hypertension   . Pneumonia due to infectious organism, unspecified laterality, unspecified part of lung   . Syncope and collapse   . AICD (automatic cardioverter/defibrillator) present   . Syncope, unspecified syncope type   . Pneumonia due to infectious organism   . Dyspnea, unspecified type   . Cardiomyopathy   . Cough   . Sepsis   . AKI (acute kidney injury)   . Sepsis, due to unspecified organism   . SOB (shortness of breath)   . Acute on chronic systolic CHF (congestive heart failure)   . History of colon polyps   . Gastroesophageal reflux disease, esophagitis presence not specified   . Hypertensive heart disease without heart failure   . Atherosclerotic heart disease of native coronary artery without angina pectoris   . Heart  failure, unspecified   . Cardiomyopathy, unspecified       Current Medications:  Outpatient Medications Marked as Taking for the 03/07/20 encounter (Office Visit) with Zorita Pang, MD   Medication Sig Dispense Refill   . ALPRAZolam (XANAX) 0.25 MG tablet Take 0.25 mg by mouth nightly as needed     . aspirin EC 81 MG EC tablet Take 81 mg by mouth daily     . atorvastatin (LIPITOR) 40 MG tablet Take 1 tablet (40 mg total) by mouth nightly. (Patient taking differently: Take 40 mg by mouth every morning    ) 30 tablet 0   . carvedilol (COREG) 25 MG tablet Take 1 tablet (25 mg total) by mouth 2 (two) times daily with meals 180 tablet 3   . docusate sodium (COLACE) 100 MG capsule Take 1 capsule (100 mg total) by mouth 2 (two) times daily.     . fluticasone furoate-vilanterol (Breo Ellipta) 100-25 MCG/INH Aerosol Pwdr, Breath Activated Inhale 1 puff into the lungs daily 3 each 3   . glucose blood (ONE TOUCH ULTRA  TEST) test strip 1 each by Other route 2 (two) times daily Use as instructed 300 each 1   . insulin glargine (LANTUS SOLOSTAR) 100 UNIT/ML injection pen ADMINISTER 50 UNITS UNDER THE SKIN AT BEDTIME PER PATIENT. 45 mL 3   . JANUMET 50-1000 MG tablet TAKE 1 TABLET BY MOUTH TWICE DAILY WITH FOOD. 180 tablet 3   . niacin (NIASPAN) 500 MG CR tablet Take 1 tablet (500 mg total) by mouth 2 (two) times daily niacin ER 500 mg tablet,extended release 24 hr 180 tablet 3   . PARoxetine (Paxil) 20 MG tablet Take 1 tablet (20 mg total) by mouth every morning 30 tablet 11   . sacubitril-valsartan (ENTRESTO) 97-103 MG Tab per tablet Take 1 tablet by mouth 2 (two) times daily 180 tablet 3   . tamsulosin (FLOMAX) 0.4 MG Cap TAKE 1 CAPSULE(0.4 MG) BY MOUTH DAILY AFTER DINNER 90 capsule 3   . torsemide (DEMADEX) 20 MG tablet Take 2 tablets (40 mg total) by mouth 2 (two) times daily 360 tablet 3       Allergies:  No Known Allergies    Past Medical History:  Past Medical History:   Diagnosis Date   . Acute CHF     NOV  & DEC 2018, -  DIALYSIS CATH PLACED Aug 24 2017 TO REMOVE FLUID    . Anxiety    . CHF (congestive heart failure) 08/12/2014, 2013   . Chronic obstructive pulmonary disease     POSSIBLE PER PT HE USES  SYNBICORT BID ABD  BREO INHALER PRN   . Coronary artery disease 2011    CORONARY STENT PLACEMENT FOLLOWED BY [facility]   . Depression    . Heart attack 2011    and 2014   . Hyperlipemia    . Hypertension    . ICD (implantable 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     PLACED 2014  [facility]  LAST INTERROGATION April 01 2018 REPORT REQUESTED   . Ischemic cardiomyopathy     EF 15% ON ECHO 09-20-2017 DR GARG IN EPIC   . Pacemaker 2014    [employer] ICD/PACEMAKER COMBO LAST INTERROGATION  12-12 2018   . Pneumonia 09/2016   . Primary cardiomyopathy     Ischemic cardiomyopathy EF 15% ON ECHO 09-20-2017 DR GARG IN EPIC   . Syncope and collapse     PRIOR TO ICD/PACEMAKER PLACED 2014   . Type 2 diabetes mellitus, controlled DX 1998    BS  AVG 100  A1C 7.4 Dec 27 2017   . Wheeze     PT SEE HIS PMD April 01 2018 RE THIS-TOLD TO SEE PMD BY [facility] JUNE 12 WHEN THEY SAW HIM FOR A CL       Past Surgical History:  Past Surgical History:   Procedure Laterality Date   . CARDIAC CATHETERIZATION  2011    CORONARY STENT PLACEMENT X 2 PER PT   . CARDIAC DEFIBRILLATOR PLACEMENT  2014    [facility]   . CARDIAC PACEMAKER PLACEMENT  2014    [employer] ICD/PACEMAKER PLACED [facility]   . CIRCUMCISION  AGE 64   . COLONOSCOPY  2009   . duodenal ulcer  1973    STRESS RELATED IN SCHOOL   . ECHOCARDIOGRAM, TRANSTHORACIC  02/2016 11/2016 09/2017 12/2017   . EGD N/A 08/15/2014    Procedure: EGD;  Surgeon: Colon Branch, MD;  Location: [facility];  Service: Gastroenterology;  Laterality: N/A;  egd w/ bx   .  EGD  1975   . EGD, COLONOSCOPY N/A 04/04/2018    Procedure: EGD with bxs, COLONOSCOPY with polypectomy and clipping;  Surgeon: Doyne Keel, MD;  Location: [facility] OR;  Service: Gastroenterology;  Laterality: N/A;   . FRACTURE SURGERY  AGE 18     RT   ANKLE- CAST APPLIED   . HERNIA REPAIR  AGE 38    LEFT INGUINAL HERNIA   . MPI nuclear study  06/2016   . orthopedic surgery  AGE 61    RT foot corrective - [facility]   . TONSILLECTOMY AND ADENOIDECTOMY  1954       Family History:  Family History   Problem Relation Age of Onset   . Breast cancer Mother    . Heart attack Mother 37   . Hypertension Mother    . Diabetes Mother    . Coronary artery disease Mother    . Diabetes Brother    . Heart attack Father    . Hypertension Father        Social History:  Social History     Socioeconomic History   . Marital status: Married     Spouse name: Lajoyce Corners   . Number of children: 0   . Years of education: Not on file   . Highest education level: Not on file   Occupational History   . 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 Smoking status: Never Smoker   . Smokeless tobacco: Never Used   Substance and Sexual Activity   . Alcohol use: No     Alcohol/week: 0.0 standard drinks   . Drug use: No   . Sexual activity: Yes     Partners: Female   Other Topics Concern   . Not on file   Social History Narrative   . Not on file     Social Determinants of Health     Financial Resource Strain:    . Difficulty of Paying Living Expenses:    Food Insecurity:    . Worried About Programme researcher, 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in the Last Year:    . Barista in the Last Year:    Transportation Needs:    . Freight forwarder (Medical):    Marland Kitchen Lack of Transportation (Non-Medical):    Physical Activity:    . Days of Exercise per Week:    . Minutes of Exercise per Session:    Stress:    . Feeling of Stress :    Social Connections:    . Frequency of Communication with Friends and Family:    . Frequency of Social Gatherings with Friends and Family:    . Attends Religious Services:    . Active Member of Clubs or Organizations:    . Attends Banker Meetings:    Marland Kitchen Marital Status:    Intimate Partner Violence:    . Fear of Current or Ex-Partner:    . Emotionally Abused:    Marland Kitchen Physically Abused:     . Sexually Abused:        The following sections were reviewed this encounter by the provider:   Tobacco  Allergies  Meds  Problems  Med Hx  Surg Hx  Fam Hx         Vitals:  BP 119/71 (BP Site: Right arm, Patient Position: Sitting, Cuff Size: Large)   Pulse 80   Temp 97.1 F (36.2 C) (Skin)   Resp 20  Ht 1.829 m (6')   Wt 96.4 kg (212 lb 9.6 oz)   SpO2 99%   BMI 28.83 kg/m     ROS:  Constitutional: Negative for fever, chills and malaise/fatigue.   Respiratory: Negative for cough, shortness of breath and wheezing.    Cardiovascular: Negative for chest pain and palpitations.   Gastrointestinal: Negative for abdominal pain, diarrhea and constipation.   Musculoskeletal: Negative for joint pain.   Skin: Negative for rash.   Neurological: Negative for dizziness and headaches.   Psychiatric/Behavioral: The patient does not have insomnia.         Objective:     Physical Exam:  Constitutional: Patient is oriented to person, place, and time. Appears well-developed and well-nourished.   HENT:   Head: Normocephalic and atraumatic.   Eyes: Conjunctivae and EOM are normal. Pupils are equal, round, and reactive to light.   Cardiovascular: Normal rate, regular rhythm and normal heart sounds.  Exam reveals no gallop and no friction rub.    No murmur heard.  Pulmonary/Chest: Effort normal and breath sounds normal. Has no wheezes.   Musculoskeletal: Normal range of motion. Exhibits no edema or tenderness.   Neurological: Patient is alert and oriented to person, place, and time.   Psychiatric: Patient has a normal mood and affect.        Assessment/Plan:       1. Ingrown toenail of left foot  - Ambulatory referral to Podiatry        Zorita Pang, MD

## 2020-03-12 ENCOUNTER — Encounter (INDEPENDENT_AMBULATORY_CARE_PROVIDER_SITE_OTHER): Payer: Self-pay | Admitting: Cardiovascular Disease

## 2020-03-12 ENCOUNTER — Ambulatory Visit
Admission: RE | Admit: 2020-03-12 | Discharge: 2020-03-12 | Disposition: A | Discharge status: Home / Self Care | Payer: Commercial Managed Care - POS | Source: Ambulatory Visit | Attending: Cardiovascular Disease | Admitting: Cardiovascular Disease

## 2020-03-12 DIAGNOSIS — I2510 Atherosclerotic heart disease of native coronary artery without angina pectoris: Secondary | ICD-10-CM | POA: Insufficient documentation

## 2020-03-12 DIAGNOSIS — I350 Nonrheumatic aortic (valve) stenosis: Secondary | ICD-10-CM

## 2020-03-12 DIAGNOSIS — I081 Rheumatic disorders of both mitral and tricuspid valves: Secondary | ICD-10-CM | POA: Insufficient documentation

## 2020-03-12 DIAGNOSIS — I5022 Chronic systolic (congestive) heart failure: Secondary | ICD-10-CM | POA: Insufficient documentation

## 2020-03-12 DIAGNOSIS — I340 Nonrheumatic mitral (valve) insufficiency: Secondary | ICD-10-CM

## 2020-03-20 ENCOUNTER — Other Ambulatory Visit (INDEPENDENT_AMBULATORY_CARE_PROVIDER_SITE_OTHER): Payer: Self-pay

## 2020-03-20 DIAGNOSIS — E782 Mixed hyperlipidemia: Secondary | ICD-10-CM

## 2020-03-20 LAB — MAGNESIUM
Magnesium: 1.7 mg/dL (ref 1.6–2.3)
Magnesium: 1.7 mg/dL (ref 1.6–2.3)

## 2020-03-20 LAB — BASIC METABOLIC PANEL
African American eGFR: 54 mL/min/{1.73_m2} — ABNORMAL LOW (ref >59)
African American eGFR: 67 mL/min/{1.73_m2} (ref >59)
BUN / Creatinine Ratio: 16 (ref 10–24)
BUN / Creatinine Ratio: 16 (ref 10–24)
BUN: 20 mg/dL (ref 8–27)
BUN: 23 mg/dL (ref 8–27)
CO2: 21 mmol/L (ref 20–29)
CO2: 22 mmol/L (ref 20–29)
Calcium: 8.8 mg/dL (ref 8.6–10.2)
Calcium: 8.9 mg/dL (ref 8.6–10.2)
Chloride: 100 mmol/L (ref 96–106)
Chloride: 103 mmol/L (ref 96–106)
Creatinine: 1.24 mg/dL (ref 0.76–1.27)
Creatinine: 1.48 mg/dL — ABNORMAL HIGH (ref 0.76–1.27)
Glucose: 232 mg/dL — ABNORMAL HIGH (ref 65–99)
Glucose: 69 mg/dL (ref 65–99)
Potassium: 4.4 mmol/L (ref 3.5–5.2)
Potassium: 4.8 mmol/L (ref 3.5–5.2)
Sodium: 140 mmol/L (ref 134–144)
Sodium: 142 mmol/L (ref 134–144)
non-African American eGFR: 47 mL/min/{1.73_m2} — ABNORMAL LOW (ref >59)
non-African American eGFR: 58 mL/min/{1.73_m2} — ABNORMAL LOW (ref >59)

## 2020-03-20 MED ORDER — ATORVASTATIN CALCIUM 40 MG PO TABS
40.0000 mg | ORAL_TABLET | Freq: Every evening | ORAL | 0 refills | Status: DC
Start: 2020-03-20 — End: 2020-06-25

## 2020-03-20 MED ORDER — ATORVASTATIN CALCIUM 40 MG PO TABS
40.0000 mg | ORAL_TABLET | Freq: Every evening | ORAL | 0 refills | Status: DC
Start: 2020-03-20 — End: 2020-03-20

## 2020-03-29 ENCOUNTER — Encounter (INDEPENDENT_AMBULATORY_CARE_PROVIDER_SITE_OTHER): Payer: Self-pay

## 2020-03-31 NOTE — Progress Notes (Signed)
[facility] OFFICE PROGRESS NOTE    HRT [facility] OFFICE -CARDIOLOGY  [address]  [place] [address]  Dept: [phone]  Dept Fax: [phone]       Patient Name: [name] [name]    Date of Visit:  April 01, 2020  Date of Birth: [birth year]  AGE: 74 y.o.  Medical Record #: [id number]  Requesting Physician: Zorita Pang, MD      CHIEF COMPLAINT: Congestive Heart Failure      HISTORY OF PRESENT ILLNESS:    He is a pleasant 74 y.o. male who presents today for CHF follow-up.  The patient is lost about 10 pounds since last visit.  His breathing has improved but is still mildly labored.  When he climbs stairs he is able to make it to the top without resting, but does get mildly short of breath.  Additionally the head of his bed is raised to sleep, and he does use 2 pillows as well.  He denies any peripheral edema or abdominal distention, and states his abdomen is flatter than last visit.  Echocardiogram was repeated recently which showed stable ejection fraction of 15% still.  He does feel he has better output with the torsemide.  He did recently see his physician who told him that his right leg does not really have sensation.  He is following up with his PCP about this soon to.      PAST MEDICAL HISTORY: He has a past medical history of Acute CHF, Acute systolic (congestive) heart failure (08/2017), Anxiety, CAD (coronary artery disease), CHF (congestive heart failure) (08/12/2014, 2013), Chronic obstructive pulmonary disease, Coronary artery disease (2011), Depression, GERD (gastroesophageal reflux disease), Heart attack (2011), Hyperlipemia, Hypertension, ICD (implantable 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, Ischemic cardiomyopathy, Nonischemic cardiomyopathy, NSTEMI (non-ST elevated myocardial infarction), Pacemaker (2014), Pneumonia (09/2016), Primary cardiomyopathy, Syncope and collapse, Type 2 diabetes mellitus, controlled (DX 1998), and  Wheeze. He has a past surgical history that includes duodenal ulcer (1973); orthopedic surgery (AGE 52); EGD (N/A, 08/15/2014); Circumcision (AGE 4); Fracture surgery (AGE 33); Hernia repair (AGE 4); Colonoscopy (2009); Tonsillectomy and adenoidectomy (1954); Cardiac defibrillator placement (2014); Cardiac catheterization (2011); Cardiac pacemaker placement (2014); EGD (1975); EGD, COLONOSCOPY (N/A, 04/04/2018); ECHOCARDIOGRAM, TRANSTHORACIC (02/2016 11/2016 09/2017 12/2017); MPI nuclear study (06/2016); Correction hammer toe; BIV (11/10/2016); and ECHOCARDIOGRAM, TRANSTHORACIC (02/2016,11/2016,12/20/2017).    ALLERGIES: No Known Allergies    MEDICATIONS:   Current Outpatient Medications   Medication Sig    ALPRAZolam (XANAX) 0.25 MG tablet Take 0.25 mg by mouth nightly as needed    aspirin EC 81 MG EC tablet Take 81 mg by mouth daily    atorvastatin (LIPITOR) 40 MG tablet Take 1 tablet (40 mg total) by mouth nightly    budesonide-formoterol (Symbicort) 80-4.5 MCG/ACT inhaler Symbicort 80 mcg-4.5 mcg/actuation HFA aerosol inhaler   INHALE 2 PUFFS PO BID.    carvedilol (COREG) 25 MG tablet Take 1 tablet (25 mg total) by mouth 2 (two) times daily with meals    docusate sodium (COLACE) 100 MG capsule Take 1 capsule (100 mg total) by mouth 2 (two) times daily.    docusate sodium (Colace) 100 MG capsule Take by mouth    fluticasone furoate-vilanterol (Breo Ellipta) 100-25 MCG/INH Aerosol Pwdr, Breath Activated Inhale 1 puff into the lungs daily    glucose blood (ONE TOUCH ULTRA TEST) test strip 1 each by Other route 2 (two) times daily Use as instructed    JANUMET 50-1000 MG tablet TAKE 1 TABLET  BY MOUTH TWICE DAILY WITH FOOD.    niacin (NIASPAN) 500 MG CR tablet Take 1 tablet (500 mg total) by mouth 2 (two) times daily niacin ER 500 mg tablet,extended release 24 hr    PARoxetine (Paxil) 20 MG tablet Take 1 tablet (20 mg total) by mouth every morning    sacubitril-valsartan (ENTRESTO) 97-103 MG Tab per tablet  Take 1 tablet by mouth 2 (two) times daily    tamsulosin (FLOMAX) 0.4 MG Cap TAKE 1 CAPSULE(0.4 MG) BY MOUTH DAILY AFTER DINNER    torsemide (DEMADEX) 20 MG tablet Take 2 tablets (40 mg total) by mouth 2 (two) times daily    insulin glargine (LANTUS SOLOSTAR) 100 UNIT/ML injection pen ADMINISTER 50 UNITS UNDER THE SKIN AT BEDTIME PER PATIENT.    spironolactone (ALDACTONE) 25 MG tablet Take 0.5 tablets (12.5 mg total) by mouth daily        FAMILY HISTORY: family history includes Breast cancer in his mother; Coronary artery disease in his mother; Diabetes in his brother and mother; Heart attack in his father; Heart attack (age of onset: 28) in his mother; Hypertension in his father and mother.    SOCIAL HISTORY: He reports that he has never smoked. He has never used smokeless tobacco. He reports that he does not drink alcohol and does not use drugs.    PHYSICAL EXAMINATION    Visit Vitals  BP 108/70 (BP Site: Left arm, Patient Position: Sitting, Cuff Size: Large)   Pulse 70   Ht 1.829 m (6')   Wt 94.8 kg (209 lb)   BMI 28.35 kg/m       General Appearance:  Cooperative, alert and oriented, in no acute distress.    Skin: Warm and dry to touch, no apparent skin lesions.  Head: Normocephalic, normal hair pattern.   Eyes: conjunctivae and lids unremarkable.  ENT: Wearing facemask. No pallor or cyanosis.  Neck: Negative JVD, no hepatojugular reflux, no carotid bruit.  Chest: Few crackles at the lung bases bilaterally, and increased respiratory effort when lying at 45 degrees, otherwise CTA bilaterally with no wheezes, rales, or rhonchi   Cardiovascular: Regular rhythm, S1 normal, S2 normal, No S3 or S4, Apical impulse not displaced, no murmurs, gallops or rubs detected   Abdomen: Soft, nontender, and nondistended.   Extremities: Warm.  No edema bilaterally.  No clubbing, or cyanosis. Radial pulses 2+ bilaterally.  Neuro: Alert and oriented x3. No gross motor or sensory deficits noted, affect appropriate. Normal memory.        IMPRESSIONS:   1. Acute on chronic systolic congestive heart failure - NYHC FC III with  10lb weight loss since last visit but still has crakcles at lung bases bilaterally.  2. 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   a. 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  3. Nonobstructive coronary artery disease by remote catheterization in 2014.  a. Normal nuclear stress test in September of 2017.    4. History of neurocardiogenic syncope with no documented ventricular arrhythmias thus far.  5. Hypertension, adequately controlled.   6. Type 2 diabetes mellitus, insulin dependent, well-controlled by his report.  7. 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      RECOMMENDATIONS:  1. Continue with fluid restriction as well as sodium restriction.    2. Will begin spironolactone 12.5 mg daily to optimize diuresis as well as guideline directed medical therapy for CHF.    3. BMP  in 10 days to reassess electrolytes, renal function, and potassium on the spironolactone.    4. Consultation in the advanced CHF clinic, to make sure we are optimizing his medications appropriately.  He does have low blood pressure, which may be a prohibiting factor.                                                   Orders Placed This Encounter   Procedures    Basic Metabolic Panel    Advanced Heart Failure Consult (HRT [place])       Orders Placed This Encounter   Medications    spironolactone (ALDACTONE) 25 MG tablet     Sig: Take 0.5 tablets (12.5 mg total) by mouth daily     Dispense:  45 tablet     Refill:  3       SIGNED:    Rosalia Hammers, PA          This note was generated by the Dragon speech recognition and may contain errors or omissions not intended by the user. Grammatical errors, random word insertions, deletions, pronoun errors, and incomplete sentences are occasional consequences of this technology  due to software limitations. Not all errors are caught or corrected. If there are questions or concerns about the content of this note or information contained within the body of this dictation, they should be addressed directly with the author for clarification.

## 2020-04-01 ENCOUNTER — Encounter (INDEPENDENT_AMBULATORY_CARE_PROVIDER_SITE_OTHER): Payer: Self-pay | Admitting: Physician Assistant

## 2020-04-01 ENCOUNTER — Ambulatory Visit (INDEPENDENT_AMBULATORY_CARE_PROVIDER_SITE_OTHER): Payer: Commercial Managed Care - POS | Admitting: Physician Assistant

## 2020-04-01 VITALS — BP 108/70 | HR 70 | Ht 72.0 in | Wt 209.0 lb

## 2020-04-01 DIAGNOSIS — I2510 Atherosclerotic heart disease of native coronary artery without angina pectoris: Secondary | ICD-10-CM

## 2020-04-01 DIAGNOSIS — I428 Other cardiomyopathies: Secondary | ICD-10-CM

## 2020-04-01 DIAGNOSIS — I5043 Acute on chronic combined systolic (congestive) and diastolic (congestive) heart failure: Secondary | ICD-10-CM

## 2020-04-01 DIAGNOSIS — N183 Chronic kidney disease, stage 3 unspecified: Secondary | ICD-10-CM

## 2020-04-01 DIAGNOSIS — Z794 Long term (current) use of insulin: Secondary | ICD-10-CM

## 2020-04-01 DIAGNOSIS — E118 Type 2 diabetes mellitus with unspecified complications: Secondary | ICD-10-CM

## 2020-04-01 DIAGNOSIS — I5042 Chronic combined systolic (congestive) and diastolic (congestive) heart failure: Secondary | ICD-10-CM

## 2020-04-01 DIAGNOSIS — I10 Essential (primary) hypertension: Secondary | ICD-10-CM

## 2020-04-01 DIAGNOSIS — I5022 Chronic systolic (congestive) heart failure: Secondary | ICD-10-CM

## 2020-04-01 MED ORDER — SPIRONOLACTONE 25 MG PO TABS
12.5000 mg | ORAL_TABLET | Freq: Every day | ORAL | 3 refills | Status: DC
Start: 2020-04-01 — End: 2020-11-22

## 2020-04-02 ENCOUNTER — Telehealth (INDEPENDENT_AMBULATORY_CARE_PROVIDER_SITE_OTHER): Payer: Self-pay | Admitting: Family Medicine

## 2020-04-02 NOTE — Telephone Encounter (Signed)
Rx refill for tamsulosin (FLOMAX) 0.4 MG Cap          Preferred Pharmacies    [facility] #[id number] - [place], [place] - [address] AT [facility] [address] [facility] [address] Phone:  [phone]   Fax:  [phone]         Pt info  [phone] (H)      Thank you

## 2020-04-03 MED ORDER — TAMSULOSIN HCL 0.4 MG PO CAPS
ORAL_CAPSULE | ORAL | 3 refills | Status: DC
Start: 2020-04-03 — End: 2021-11-24

## 2020-04-03 NOTE — Telephone Encounter (Signed)
rx sent

## 2020-04-03 NOTE — Telephone Encounter (Signed)
Good Morning Dr. Clovis Riley can we fill script below pt was last seen 03/07/2020.    Thanks

## 2020-04-09 ENCOUNTER — Encounter (INDEPENDENT_AMBULATORY_CARE_PROVIDER_SITE_OTHER): Payer: Self-pay | Admitting: Family Medicine

## 2020-04-11 ENCOUNTER — Encounter (INDEPENDENT_AMBULATORY_CARE_PROVIDER_SITE_OTHER): Payer: Self-pay

## 2020-04-11 DIAGNOSIS — Z4502 Encounter for adjustment and management of automatic implantable cardiac defibrillator: Secondary | ICD-10-CM | POA: Insufficient documentation

## 2020-04-11 DIAGNOSIS — I5022 Chronic systolic (congestive) heart failure: Secondary | ICD-10-CM | POA: Insufficient documentation

## 2020-04-15 ENCOUNTER — Encounter (INDEPENDENT_AMBULATORY_CARE_PROVIDER_SITE_OTHER): Payer: Commercial Managed Care - POS | Admitting: Cardiovascular Disease

## 2020-04-15 DIAGNOSIS — Z95810 Presence of automatic (implantable) cardiac defibrillator: Secondary | ICD-10-CM

## 2020-04-18 ENCOUNTER — Ambulatory Visit (INDEPENDENT_AMBULATORY_CARE_PROVIDER_SITE_OTHER): Payer: Commercial Managed Care - POS | Admitting: Cardiovascular Disease

## 2020-04-18 ENCOUNTER — Encounter (INDEPENDENT_AMBULATORY_CARE_PROVIDER_SITE_OTHER): Payer: Self-pay | Admitting: Cardiovascular Disease

## 2020-04-18 VITALS — BP 126/74 | HR 80 | Wt 206.2 lb

## 2020-04-18 DIAGNOSIS — I5042 Chronic combined systolic (congestive) and diastolic (congestive) heart failure: Secondary | ICD-10-CM

## 2020-04-18 DIAGNOSIS — I428 Other cardiomyopathies: Secondary | ICD-10-CM

## 2020-04-18 NOTE — Progress Notes (Signed)
[facility] ADVANCED HEART FAILURE SUB-SPECIALTY consultation note    HRT [facility] OFFICE -CARDIOLOGY  [address]  [place] [address]  Dept: [phone]  Dept Fax: [phone]       Patient Name: [name] [name]    Date of Visit:  April 18, 2020  Date of Birth: [birth year]  AGE: 74 y.o.  Medical Record #: [id number]  Requesting Physician: Zorita Pang, MD    CHIEF COMPLAINT:  Cardiomyopathy and Congestive Heart Failure      HISTORY OF PRESENT ILLNESS:    I had the pleasure of seeing [name] today as a new subspecialty consultation in the Advanced Heart Failure Clinic at [facility]. He is a pleasant 74 y.o. 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  Is noted that his ejection fraction was 15% on echocardiogram in May 2021.  He has grade 3 diastolic dysfunction and mild MR and TR.  He has a Medtronic BiV ICD.  His Coreg 25 mg twice daily, Entresto 97 1 3 mg twice daily Spironolactone 12.5 mg daily.  He is on torsemide 40 mg twice daily.    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    PAST MEDICAL HISTORY: He has a past medical history of Acute CHF, Acute systolic (congestive) heart failure (08/2017), Anxiety, CAD (coronary artery disease), Cardiomyopathy, CHF (congestive heart failure) (08/12/2014, 2013), Chronic obstructive pulmonary disease, Coronary artery disease (2011), Depression, echocardiogram (02/2016, 11/2016, 09/2017, 12/20/2017), GERD (gastroesophageal reflux disease), Heart attack (2011), Hyperlipemia, Hypertension, ICD (implantable 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, Ischemic cardiomyopathy, Nonischemic cardiomyopathy, NSTEMI (non-ST elevated myocardial infarction), 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 He has a past surgical history that  includes duodenal ulcer (1973); orthopedic surgery (AGE 10); EGD (N/A, 08/15/2014); Circumcision (AGE 31); Fracture surgery (AGE 61); Hernia repair (AGE 31); Colonoscopy (2009); Tonsillectomy and adenoidectomy (1954); Cardiac defibrillator placement (12/2012); Cardiac pacemaker placement (2014); EGD (1975); EGD, COLONOSCOPY (N/A, 04/04/2018); ECHOCARDIOGRAM, TRANSTHORACIC (02/2016 11/2016 09/2017 12/2017); MPI nuclear study (06/2016); Correction hammer toe; BIV (11/10/2016); ECHOCARDIOGRAM, TRANSTHORACIC (02/2016,11/2016,12/20/2017); Cardiac catheterization (03/2010); and Cardiac catheterization (12/2012).    ALLERGIES: No Known Allergies    MEDICATIONS:   Current Outpatient Medications   Medication Sig    ALPRAZolam (XANAX) 0.25 MG tablet Take 0.25 mg by mouth nightly as needed    aspirin EC 81 MG EC tablet Take 81 mg by mouth daily    atorvastatin (LIPITOR) 40 MG tablet Take 1 tablet (40 mg total) by mouth nightly    carvedilol (COREG) 25 MG tablet Take 1 tablet (25 mg total) by mouth 2 (two) times daily with meals    docusate sodium (Colace) 100 MG capsule Take by mouth    fluticasone furoate-vilanterol (Breo Ellipta) 100-25 MCG/INH Aerosol Pwdr, Breath Activated Inhale 1 puff into the lungs daily    fluticasone furoate-vilanterol (Breo Ellipta) 100-25 MCG/INH Aerosol Pwdr, Breath Activated Inhale 1 puff into the lungs    glucose blood (ONE TOUCH ULTRA TEST) test strip 1 each by Other route 2 (two) times daily Use as instructed    insulin glargine (LANTUS SOLOSTAR) 100 UNIT/ML injection pen ADMINISTER 50 UNITS UNDER THE SKIN AT BEDTIME PER PATIENT.    JANUMET 50-1000 MG tablet TAKE 1 TABLET BY MOUTH TWICE DAILY WITH FOOD.    niacin (NIASPAN) 500 MG CR tablet Take 1 tablet (500 mg total) by mouth 2 (  two) times daily niacin ER 500 mg tablet,extended release 24 hr    PARoxetine (Paxil) 20 MG tablet Take 1 tablet (20 mg total) by mouth every morning    sacubitril-valsartan (ENTRESTO) 97-103 MG Tab per tablet  Take 1 tablet by mouth 2 (two) times daily    spironolactone (ALDACTONE) 25 MG tablet Take 0.5 tablets (12.5 mg total) by mouth daily    tamsulosin (FLOMAX) 0.4 MG Cap TAKE 1 CAPSULE(0.4 MG) BY MOUTH DAILY AFTER DINNER    torsemide (DEMADEX) 20 MG tablet Take 2 tablets (40 mg total) by mouth 2 (two) times daily        FAMILY HISTORY: family history includes Breast cancer in his mother; Coronary artery disease in his mother; Diabetes in his brother and mother; Heart attack in his father; Heart attack (age of onset: 39) in his mother; Hypertension in his father and mother.    SOCIAL HISTORY: He reports that he has never smoked. He has never used smokeless tobacco. He reports that he does not drink alcohol and does not use drugs.    PHYSICAL EXAMINATION    Visit Vitals  Blood Pressure 126/74 (BP Site: Left arm, Patient Position: Sitting, Cuff Size: Medium)   Pulse 80   Weight 93.5 kg (206 lb 3.2 oz)   Body Mass Index 27.97 kg/m       Last 3 Weights:   Weight Monitoring 03/07/2020 04/01/2020 04/18/2020   Height 182.9 cm 182.9 cm -   Height Method - - -   Weight 96.435 kg 94.802 kg 93.532 kg   Weight Method - - -   BMI (calculated) 28.9 kg/m2 28.4 kg/m2 -         General Appearance:  A well-appearing male in no acute distress.    Skin: Warm and dry to touch, no apparent skin lesions  Head: Normocephalic, normal hair pattern  Eyes: EOMS Intact, conjunctivae and lids unremarkable.    ENT: Ears, Nose and throat reveal no gross abnormalities.  No pallor or cyanosis.   Neck: JVP is normal  Chest: Clear to auscultation bilaterally with good air movement and respiratory effort and no wheezes, rales, or rhonchi   Cardiovascular: Regular rhythm, S1 normal, S2 normal, No S3 or S4. Apical impulse not displaced,. No murmur. No gallops or rubs detected   Abdomen: Soft, nontender, nondistended, with normoactive bowel sounds. No organomegaly.    Extremities: Warm without edema. No clubbing, or cyanosis.  Radial pulses are full and  equal.   Neuro: Alert and oriented x3. No gross motor or sensory deficits noted, affect appropriate.        LABS:   Lab Results   Component Value Date    WBC 14.42 (H) 11/30/2019    HGB 15.7 11/30/2019    HCT 49.7 (H) 11/30/2019    PLT 154 11/30/2019     Lab Results   Component Value Date    GLU 75 11/30/2019    BUN 21.0 11/30/2019    CREAT 1.4 11/30/2019    NA 141 11/30/2019    K 4.8 11/30/2019    CL 103 11/30/2019    CO2 26 11/30/2019    CA 9.3 11/30/2019    PROT 7.6 11/30/2019    AST 14 11/30/2019    ALT 15 11/30/2019    BILITOTAL 0.9 11/30/2019    GLOB 3.0 11/30/2019    ALB 4.6 11/30/2019     Lab Results   Component Value Date    TSH 0.74 08/12/2014    CK  224 (H) 12/29/2013    BNP 956.2 (H) 09/18/2017    BNP 1,792.4 (H) 09/16/2017    BNP 1,551.7 (H) 08/24/2017    HGBA1C 6.4 (H) 01/26/2020     Lab Results   Component Value Date    CHOL 119 11/30/2019    CHOL 110 02/06/2019    TRIG 85 11/30/2019    TRIG 111 02/06/2019    HDL 37 (L) 11/30/2019    HDL 36 (L) 02/06/2019    LDL 65 11/30/2019    LDL 52 02/06/2019    LABVLDL 17 11/30/2019    LABVLDL 22 02/06/2019    CHOLHDLRATIO 3.2 11/30/2019    CHOLHDLRATIO 3.1 02/06/2019           IMPRESSION / RECOMMENDATIONS:   1. [name] [name] a 74 y.o.male with chronic systolic heart failure due to nonischemic cardiomyopathy, ACC/AHA stage C, NYHA class 2. Today he is feeling well and is euvolemic on exam.  The last LVEF was 15% demonstrated by echocardiogram in May 2021.  2. Goal directed therapy includes:  a. Beta blocker: Carvedilol 25 mg twice daily  b. ACE/ARB/ARNI: Entresto 97/103 mg twice daily  c. MRA: Spironolactone 12.5 mg daily  3. Current diuretic regimen: Torsemide 40 mg twice daily  4. For further evaluation will have a pacemaker optimization visit.  Also ordering labs for BMP, and a proBNP.  He may benefit from [place].  We will send a message to Dr. Zorita Pang to review the possibility of starting an SGLT.  5. Next follow up will be with Conception Oms, NP in 3 to  4 weeks..                                                 Orders Placed This Encounter   Procedures    Lower Extremity Arterial Duplex [LEAD]    Basic Metabolic Panel    NT-Pro B-Type Natriuretic Peptide    ECG 12 lead (Normal)    Echo    Advanced Heart Failure Office Visit (HRT [place])       No orders of the defined types were placed in this encounter.        SIGNED:    Marianna Fuss, MD          This note was generated by the Dragon speech recognition and may contain errors or omissions not intended by the user. Grammatical errors, random word insertions, deletions, pronoun errors, and incomplete sentences are occasional consequences of this technology due to software limitations. Not all errors are caught or corrected. If there are questions or concerns about the content of this note or information contained within the body of this dictation, they should be addressed directly with the author for clarification.

## 2020-04-29 ENCOUNTER — Ambulatory Visit
Admission: RE | Admit: 2020-04-29 | Discharge: 2020-04-29 | Disposition: A | Discharge status: Home / Self Care | Payer: Commercial Managed Care - POS | Source: Ambulatory Visit | Attending: Cardiovascular Disease | Admitting: Cardiovascular Disease

## 2020-04-29 DIAGNOSIS — I5189 Other ill-defined heart diseases: Secondary | ICD-10-CM | POA: Insufficient documentation

## 2020-04-29 DIAGNOSIS — I428 Other cardiomyopathies: Secondary | ICD-10-CM | POA: Insufficient documentation

## 2020-04-29 DIAGNOSIS — I517 Cardiomegaly: Secondary | ICD-10-CM | POA: Insufficient documentation

## 2020-04-29 DIAGNOSIS — I5042 Chronic combined systolic (congestive) and diastolic (congestive) heart failure: Secondary | ICD-10-CM | POA: Insufficient documentation

## 2020-04-29 DIAGNOSIS — I340 Nonrheumatic mitral (valve) insufficiency: Secondary | ICD-10-CM | POA: Insufficient documentation

## 2020-05-04 ENCOUNTER — Encounter (INDEPENDENT_AMBULATORY_CARE_PROVIDER_SITE_OTHER): Payer: Self-pay

## 2020-05-04 NOTE — Progress Notes (Signed)
May 04, 2020  Patient sent Remote Transmission. Battery life and leads stable. Additional information available upon request.

## 2020-05-10 ENCOUNTER — Ambulatory Visit (INDEPENDENT_AMBULATORY_CARE_PROVIDER_SITE_OTHER): Payer: Commercial Managed Care - POS

## 2020-05-10 DIAGNOSIS — I428 Other cardiomyopathies: Secondary | ICD-10-CM

## 2020-05-10 DIAGNOSIS — I5042 Chronic combined systolic (congestive) and diastolic (congestive) heart failure: Secondary | ICD-10-CM

## 2020-05-14 ENCOUNTER — Encounter (INDEPENDENT_AMBULATORY_CARE_PROVIDER_SITE_OTHER): Payer: Self-pay | Admitting: Cardiovascular Disease

## 2020-05-16 ENCOUNTER — Telehealth (INDEPENDENT_AMBULATORY_CARE_PROVIDER_SITE_OTHER): Payer: Self-pay

## 2020-05-16 ENCOUNTER — Encounter (INDEPENDENT_AMBULATORY_CARE_PROVIDER_SITE_OTHER): Payer: Self-pay

## 2020-05-16 NOTE — Telephone Encounter (Signed)
Left voicemail to call back for LEAD results per Dr. Eben Burow (unread mychart message) "Your arterial study shows mild, nonobstructive plaque in both legs. The ankle-brachial index is normal at rest. There may be blockage of one of the small arteries from the knee to the foot. This should not cause any symptoms for you while walking. "

## 2020-05-23 ENCOUNTER — Telehealth (INDEPENDENT_AMBULATORY_CARE_PROVIDER_SITE_OTHER): Payer: Self-pay | Admitting: Family Medicine

## 2020-05-23 MED ORDER — JANUMET 50-1000 MG PO TABS
1.0000 | ORAL_TABLET | Freq: Two times a day (BID) | ORAL | 3 refills | Status: DC
Start: 2020-05-23 — End: 2021-10-29

## 2020-05-23 NOTE — Telephone Encounter (Signed)
[facility] is calling for the following     Refill JANUMET 50-1000 MG tablet    [facility] states that patient is completely out and they need provider to put a rush on the medication.    [facility] #[id number] - [place], [place] - [id number] AT [facility] OF [address]

## 2020-05-23 NOTE — Telephone Encounter (Signed)
Dr Altamease Oiler, pt called again for this med. Last office visit 02/2020

## 2020-05-23 NOTE — Telephone Encounter (Signed)
Rx refill. LOV 03/07/2020

## 2020-05-23 NOTE — Telephone Encounter (Signed)
Refilled medication as requested.

## 2020-05-31 ENCOUNTER — Ambulatory Visit (INDEPENDENT_AMBULATORY_CARE_PROVIDER_SITE_OTHER): Payer: Commercial Managed Care - POS | Admitting: Cardiovascular Disease

## 2020-06-13 ENCOUNTER — Ambulatory Visit (INDEPENDENT_AMBULATORY_CARE_PROVIDER_SITE_OTHER): Payer: Commercial Managed Care - POS | Admitting: Cardiovascular Disease

## 2020-06-19 ENCOUNTER — Other Ambulatory Visit (INDEPENDENT_AMBULATORY_CARE_PROVIDER_SITE_OTHER): Payer: Self-pay | Admitting: Cardiovascular Disease

## 2020-06-19 DIAGNOSIS — E782 Mixed hyperlipidemia: Secondary | ICD-10-CM

## 2020-06-25 ENCOUNTER — Other Ambulatory Visit (INDEPENDENT_AMBULATORY_CARE_PROVIDER_SITE_OTHER): Payer: Self-pay

## 2020-06-25 DIAGNOSIS — E782 Mixed hyperlipidemia: Secondary | ICD-10-CM

## 2020-06-25 MED ORDER — ATORVASTATIN CALCIUM 40 MG PO TABS
40.0000 mg | ORAL_TABLET | Freq: Every evening | ORAL | 0 refills | Status: DC
Start: 2020-06-25 — End: 2021-07-16

## 2020-06-27 ENCOUNTER — Other Ambulatory Visit (INDEPENDENT_AMBULATORY_CARE_PROVIDER_SITE_OTHER): Payer: Self-pay | Admitting: Family Medicine

## 2020-06-28 ENCOUNTER — Ambulatory Visit (INDEPENDENT_AMBULATORY_CARE_PROVIDER_SITE_OTHER): Payer: Commercial Managed Care - POS | Admitting: Family Medicine

## 2020-06-28 ENCOUNTER — Encounter (INDEPENDENT_AMBULATORY_CARE_PROVIDER_SITE_OTHER): Payer: Self-pay | Admitting: Family Medicine

## 2020-06-28 VITALS — BP 104/59 | HR 62 | Temp 97.6°F | Wt 204.6 lb

## 2020-06-28 DIAGNOSIS — I5023 Acute on chronic systolic (congestive) heart failure: Secondary | ICD-10-CM

## 2020-06-28 DIAGNOSIS — Z23 Encounter for immunization: Secondary | ICD-10-CM

## 2020-06-28 DIAGNOSIS — E118 Type 2 diabetes mellitus with unspecified complications: Secondary | ICD-10-CM

## 2020-06-28 DIAGNOSIS — E1129 Type 2 diabetes mellitus with other diabetic kidney complication: Secondary | ICD-10-CM

## 2020-06-28 DIAGNOSIS — Z794 Long term (current) use of insulin: Secondary | ICD-10-CM

## 2020-06-28 DIAGNOSIS — H269 Unspecified cataract: Secondary | ICD-10-CM

## 2020-06-28 DIAGNOSIS — Z01818 Encounter for other preprocedural examination: Secondary | ICD-10-CM

## 2020-06-28 NOTE — Progress Notes (Signed)
Date: 06/28/2020 4:43 PM   Patient ID: [name] [name] is a 74 y.o. male.         Have you seen any specialists/other providers since your last visit with [place]?    Yes    Arm preference verified?   Yes    The patient is due for influenza vaccine    Subjective:     Chief Complaint:  Chief Complaint   Patient presents with   . Pre-op Exam     9/20 & 10/04       HPI:  Visit Type: Pre-operative Evaluation  Procedure: cataract surgery  Date of Surgery: 07/08/2020, 07/22/2020  Surgeon: Dr. Adela Lank D. Griffiths  Fax Number (Required): [phone]  Reason for Surgery: Cataract  Recent Health (admits): no current complaints  Recent Health (denies): no current complaints  ---------------------------------------  Exercise Tolerance: 6 met ( i.e. shoveling snow )  Surgical Risk Factors: No active cardiac or  pulmonary conditions  Prior Anesthesia: Patient reports no adverse reaction to anesthesia in the past.     Problem List:  Patient Active Problem List   Diagnosis   . Chest pain   . Type 2 diabetes mellitus with complication, with long-term current use of insulin   . Heart attack   . CAD (coronary artery disease)   . Non-ischemic cardiomyopathy   . Acute exacerbation of CHF (congestive heart failure)   . PUD (peptic ulcer disease)   . Elevated LFTs   . Acute dyspnea   . Elevated d-dimer   . CHF (congestive heart failure)   . Headaches due to old head injury   . Essential hypertension   . Pneumonia due to infectious organism, unspecified laterality, unspecified part of lung   . Syncope and collapse   . AICD (automatic cardioverter/defibrillator) present   . Syncope, unspecified syncope type   . Pneumonia due to infectious organism   . Dyspnea, unspecified type   . Cardiomyopathy   . Cough   . Sepsis   . AKI (acute kidney injury)   . Sepsis, due to unspecified organism   . SOB (shortness of breath)   . Acute on chronic systolic CHF (congestive heart failure)   . History of colon polyps   . Gastroesophageal reflux  disease, esophagitis presence not specified   . Hypertensive heart disease without heart failure   . Atherosclerotic heart disease of native coronary artery without angina pectoris   . Heart failure, unspecified   . Nonischemic cardiomyopathy   . BO (blackout)   . Belching   . Acute subendocardial infarction, subsequent episode of care   . Difficulty sleeping   . Dizziness   . Encounter for examination for normal comparison and control in clinical research program   . Fatigue   . Nasal congestion   . Paroxysmal ventricular tachycardia   . Pure hypercholesterolemia   . Tachycardia, unspecified   . Chronic systolic congestive heart failure   . Encounter for implantable defibrillator reprogramming or check       Current Medications:  Outpatient Medications Marked as Taking for the 06/28/20 encounter (Office Visit) with Zorita Pang, MD   Medication Sig Dispense Refill   . ALPRAZolam (XANAX) 0.25 MG tablet Take 0.25 mg by mouth nightly as needed     . aspirin EC 81 MG EC tablet Take 81 mg by mouth daily     . atorvastatin (LIPITOR) 40 MG tablet Take 1 tablet (40 mg total) by mouth nightly 90 tablet 0   . carvedilol (  COREG) 25 MG tablet Take 1 tablet (25 mg total) by mouth 2 (two) times daily with meals 180 tablet 3   . docusate sodium (Colace) 100 MG capsule Take by mouth     . fluticasone furoate-vilanterol (Breo Ellipta) 100-25 MCG/INH Aerosol Pwdr, Breath Activated Inhale 1 puff into the lungs daily 3 each 3   . fluticasone furoate-vilanterol (Breo Ellipta) 100-25 MCG/INH Aerosol Pwdr, Breath Activated Inhale 1 puff into the lungs     . glucose blood (ONE TOUCH ULTRA TEST) test strip 1 each by Other route 2 (two) times daily Use as instructed 300 each 1   . insulin glargine (LANTUS SOLOSTAR) 100 UNIT/ML injection pen ADMINISTER 50 UNITS UNDER THE SKIN AT BEDTIME PER PATIENT. 45 mL 3   . niacin (NIASPAN) 500 MG CR tablet Take 1 tablet (500 mg total) by mouth 2 (two) times daily niacin ER 500 mg tablet,extended  release 24 hr 180 tablet 3   . PARoxetine (Paxil) 20 MG tablet Take 1 tablet (20 mg total) by mouth every morning 30 tablet 11   . sacubitril-valsartan (ENTRESTO) 97-103 MG Tab per tablet Take 1 tablet by mouth 2 (two) times daily 180 tablet 3   . SITagliptin-metFORMIN (Janumet) 50-1000 MG tablet Take 1 tablet by mouth 2 (two) times daily with meals 180 tablet 3   . spironolactone (ALDACTONE) 25 MG tablet Take 0.5 tablets (12.5 mg total) by mouth daily 45 tablet 3   . tamsulosin (FLOMAX) 0.4 MG Cap TAKE 1 CAPSULE(0.4 MG) BY MOUTH DAILY AFTER DINNER 90 capsule 3   . torsemide (DEMADEX) 20 MG tablet Take 2 tablets (40 mg total) by mouth 2 (two) times daily 360 tablet 3       Allergies:  No Known Allergies    Past Medical History:  Past Medical History:   Diagnosis Date   . Acute CHF     NOV  & DEC 2018, - DIALYSIS CATH PLACED Aug 24 2017 TO REMOVE FLUID    . Acute systolic (congestive) heart failure 08/2017   . Anxiety    . CAD (coronary artery disease)    . Cardiomyopathy     nonischemic   . CHF (congestive heart failure) 08/12/2014, 2013   . Chronic obstructive pulmonary disease     POSSIBLE PER PT HE USES  SYNBICORT BID ABD  BREO INHALER PRN   . Coronary artery disease 2011    CORONARY STENT PLACEMENT FOLLOWED BY [facility]   . Depression    . echocardiogram 02/2016, 11/2016, 09/2017, 12/20/2017   . GERD (gastroesophageal reflux disease)    . Heart attack 2011    and 2014   . Hyperlipemia    . Hypertension    . ICD (implantable 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     PLACED 2014  [facility]  LAST INTERROGATION April 01 2018 REPORT REQUESTED   . Ischemic cardiomyopathy     EF 15% ON ECHO 09-20-2017 DR GARG IN EPIC   . Nonischemic cardiomyopathy    . NSTEMI (non-ST elevated myocardial infarction)    . 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   . Pacemaker 2014    MEDTRONIC ICD/PACEMAKER COMBO LAST INTERROGATION  12-12 2018   . Pneumonia 09/2016   . Primary cardiomyopathy     Ischemic cardiomyopathy EF 15% ON ECHO 09-20-2017 DR GARG IN EPIC   .  Sepsis 08/2017   . Syncope and collapse     PRIOR TO ICD/PACEMAKER PLACED 2014   . Type 2 diabetes mellitus, controlled DX 1998  BS  AVG 100  A1C 7.4 Dec 27 2017   . Wheeze     PT SEE HIS PMD April 01 2018 RE THIS-TOLD TO SEE PMD BY [facility] JUNE 12 WHEN THEY SAW HIM FOR A CL       Past Surgical History:  Past Surgical History:   Procedure Laterality Date   . BIV  11/10/2016    ICD METRONIC  UPGRADE    . CARDIAC CATHETERIZATION  03/2010    CORONARY STENT PLACEMENT X 2 PER PT; LM normal, LAD 70-80% distal lesion at apex, 20% lesion mid CFX   . CARDIAC CATHETERIZATION  12/2012   . CARDIAC DEFIBRILLATOR PLACEMENT  12/2012    [facility]   . CARDIAC PACEMAKER PLACEMENT  2014    MEDTRONIC ICD/PACEMAKER PLACED  HEART   . CIRCUMCISION  AGE 64   . COLONOSCOPY  2009   . CORRECTION HAMMER TOE     . duodenal ulcer  1973    STRESS RELATED IN SCHOOL   . ECHOCARDIOGRAM, TRANSTHORACIC  02/2016 11/2016 09/2017 12/2017   . ECHOCARDIOGRAM, TRANSTHORACIC  02/2016,11/2016,12/20/2017   . EGD N/A 08/15/2014    Procedure: EGD;  Surgeon: Colon Branch, MD;  Location: [facility];  Service: Gastroenterology;  Laterality: N/A;  egd w/ bx   . EGD  1975   . EGD, COLONOSCOPY N/A 04/04/2018    Procedure: EGD with bxs, COLONOSCOPY with polypectomy and clipping;  Surgeon: Doyne Keel, MD;  Location: [facility];  Service: Gastroenterology;  Laterality: N/A;   . FRACTURE SURGERY  AGE 32     RT  ANKLE- CAST APPLIED   . HERNIA REPAIR  AGE 64    LEFT INGUINAL HERNIA   . MPI nuclear study  06/2016   . orthopedic surgery  AGE 71    RT foot corrective - HAMMERTOE   . TONSILLECTOMY AND ADENOIDECTOMY  1954       Family History:  Family History   Problem Relation Age of Onset   . Breast cancer Mother    . Heart attack Mother 59   . Hypertension Mother    . Diabetes Mother    . Coronary artery disease Mother    . Diabetes Brother    . Heart attack Father    . Hypertension Father        Social History:  Social History     Tobacco Use   .  Smoking status: Never Smoker   . Smokeless tobacco: Never Used   Vaping Use   . Vaping Use: Never used   Substance Use Topics   . Alcohol use: No     Alcohol/week: 0.0 standard drinks     Comment: occasionally   . Drug use: No       The following sections were reviewed this encounter by the provider:   Tobacco  Allergies  Meds  Problems  Med Hx  Surg Hx  Fam Hx         Vitals:  BP 104/59 (BP Site: Right arm, Patient Position: Sitting, Cuff Size: Large)   Pulse 62   Temp 97.6 F (36.4 C) (Temporal)   Wt 92.8 kg (204 lb 9.6 oz)   SpO2 94%   BMI 27.75 kg/m        ROS:   General/Constitutional:   Denies Change in appetite. Denies Chills. Denies Fatigue. Denies Fever. Denies Weight gain. Denies Weight loss.   Ophthalmologic:   Denies Blurred vision. Denies Diminished visual acuity.  Denies Eye Pain.   ENT:   Denies Hearing Loss. Denies Nasal Discharge. Denies Hoarseness. Denies Ear pain. Denies Nosebleed. Denies Sinus pain. Denies Sore throat. Denies Sneezing.   Endocrine:   Denies Polydipsia. Denies Polyuria.   Cardiovascular:   Denies Chest pain. Denies Chest pain with exertion. Denies Leg Claudication. Denies Palpitations. Denies Swelling in hands/feet.   Respiratory:   Denies Paroxysmal Nocturnal Dyspnea. Denies Cough. Denies Orthopnea. Denies Shortness of breath. Denies Daytime Hypersomnolence. Denies Snoring. Denies Witness Apnea. Denies Wheezing.   Gastrointestinal:   Denies Abdominal pain. Denies Blood in stool. Denies Constipation. Denies Diarrhea. Denies Heartburn. Denies Nausea. Denies Vomiting.   Hematology:   Denies Easy bruising. Denies Easy Bleeding.   Genitourinary:   Denies Blood in urine. Denies Nocturia.   Musculoskeletal:   Denies Joint pain. Denies Leg cramps. Denies Weakness in LE. Denies Swollen joints.   Peripheral Vascular:   Denies Cold extremities. Denies Decreased sensation in extremities. Denies Painful extremities.   Skin:   Denies Itching. Denies Change in Mole(s). Denies Rash.    Neurologic:   Denies Balance difficulty. Denies Dizziness. Denies Headache. Denies Pre-Syncope. Denies Memory loss.   Psychiatric:   Denies Anxiety. Denies Depressed mood. Denies Difficulty sleeping. Denies Mood Swings.     Objective:     Examination:   General Examination:   GENERAL APPEARANCE: alert, in no acute distress, well developed, well nourished, oriented to time, place, and person.   HEAD: normal appearance, atraumatic.   EYES: extraocular movement intact (EOMI), pupils equal, round, reactive to light and accommodation, sclera anicteric, conjunctiva clear.   EARS: tympanic membranes normal bilaterally, external canals normal .   NOSE: normal nasal mucosa, no lesions.   ORAL CAVITY: normal oropharynx, normal lips, mucosa moist, no lesions.   THROAT: normal appearance, clear, no erythema.   NECK/THYROID: neck supple, no carotid bruit, carotid pulse 2+ bilaterally, no cervical lymphadenopathy, no neck mass palpated, no jugular venous distention, no thyromegaly.   LYMPH NODES: no palpable adenopathy.   SKIN: good turgor, no rashes, no suspicious lesions.   HEART: S1, S2 normal, no murmurs, rubs, gallops, regular rate and rhythm.   LUNGS: normal effort / no distress, normal breath sounds, clear to auscultation bilaterally, no wheezes, rales, rhonchi.   ABDOMEN: bowel sounds present, no hepatosplenomegaly, soft, nontender, nondistended.   RECTAL: not examined.   MALE GENITOURINARY: no penile lesions or discharge, no testicular mass.   MUSCULOSKELETAL: full range of motion, no swelling or deformity.   EXTREMITIES: no edema, no clubbing, cyanosis, or edema.   PERIPHERAL PULSES: 2+ dorsalis pedis, 2+ posterior tibial.   NEUROLOGIC: nonfocal, cranial nerves 2-12 grossly intact, deep tendon reflexes 2+ symmetrical, normal strength, tone and reflexes, sensory exam intact.   PSYCH: cognitive function intact, mood/affect full range, speech clear.     Assessment/Plan:       1. Type 2 diabetes mellitus with  complication, with long-term current use of insulin  - CBC and differential  - Comprehensive metabolic panel  - Hemoglobin A1C    2. Preoperative evaluation to rule out surgical contraindication  Patient is cleared for surgery.  Patient will see his cardiologist for preop clearance before surgery.

## 2020-06-28 NOTE — Addendum Note (Signed)
Addended byZorita Pang on: 06/28/2020 05:13 PM     Modules accepted: Orders

## 2020-06-29 LAB — COMPREHENSIVE METABOLIC PANEL
ALT: 13 U/L (ref 0–55)
AST (SGOT): 13 U/L (ref 5–34)
Albumin/Globulin Ratio: 1.7 (ref 0.9–2.2)
Albumin: 4.3 g/dL (ref 3.5–5.0)
Alkaline Phosphatase: 81 U/L (ref 37–117)
Anion Gap: 9 (ref 5.0–15.0)
BUN: 19 mg/dL (ref 9.0–28.0)
Bilirubin, Total: 0.5 mg/dL (ref 0.2–1.2)
CO2: 24 mEq/L (ref 21–29)
Calcium: 9.5 mg/dL (ref 7.9–10.2)
Chloride: 105 mEq/L (ref 100–111)
Creatinine: 1.5 mg/dL (ref 0.5–1.5)
Globulin: 2.6 g/dL (ref 2.0–3.7)
Glucose: 221 mg/dL — ABNORMAL HIGH (ref 70–100)
Potassium: 4.7 mEq/L (ref 3.5–5.1)
Protein, Total: 6.9 g/dL (ref 6.0–8.3)
Sodium: 138 mEq/L (ref 136–145)

## 2020-06-29 LAB — CELL MORPHOLOGY
Cell Morphology: ABNORMAL — AB
Platelet Estimate: DECREASED — AB

## 2020-06-29 LAB — MAN DIFF ONLY
Atypical Lymphocytes %: 3 %
Atypical Lymphocytes Absolute: 0.59 10*3/uL — ABNORMAL HIGH (ref 0.00–0.00)
Band Neutrophils Absolute: 0 10*3/uL (ref 0.00–1.00)
Band Neutrophils: 0 %
Basophils Absolute Manual: 0 10*3/uL (ref 0.00–0.08)
Basophils Manual: 0 %
Eosinophils Absolute Manual: 0 10*3/uL (ref 0.00–0.44)
Eosinophils Manual: 0 %
Lymphocytes Absolute Manual: 15.32 10*3/uL — ABNORMAL HIGH (ref 0.42–3.22)
Lymphocytes Manual: 78 %
Monocytes Absolute: 0.98 10*3/uL — ABNORMAL HIGH (ref 0.21–0.85)
Monocytes Manual: 5 %
Neutrophils Absolute Manual: 2.55 10*3/uL (ref 1.10–6.33)
Plasma Cells Absolute: 0.2 10*3/uL — ABNORMAL HIGH
Plasmacytoid: 1 %
Segmented Neutrophils: 13 %

## 2020-06-29 LAB — CBC AND DIFFERENTIAL
Absolute NRBC: 0 10*3/uL (ref 0.00–0.00)
Hematocrit: 41.2 % (ref 37.6–49.6)
Hgb: 13.2 g/dL (ref 12.5–17.1)
MCH: 33.2 pg (ref 25.1–33.5)
MCHC: 32 g/dL (ref 31.5–35.8)
MCV: 103.8 fL — ABNORMAL HIGH (ref 78.0–96.0)
MPV: 13 fL — ABNORMAL HIGH (ref 8.9–12.5)
Nucleated RBC: 0 /100 WBC (ref 0.0–0.0)
Platelets: 132 10*3/uL — ABNORMAL LOW (ref 142–346)
RBC: 3.97 10*6/uL — ABNORMAL LOW (ref 4.20–5.90)
RDW: 14 % (ref 11–15)
WBC: 19.64 10*3/uL — ABNORMAL HIGH (ref 3.10–9.50)

## 2020-06-29 LAB — HEMOGLOBIN A1C
Average Estimated Glucose: 171.4 mg/dL
Hemoglobin A1C: 7.6 % — ABNORMAL HIGH (ref 4.6–5.9)

## 2020-06-29 LAB — GFR: EGFR: 55.4

## 2020-06-29 LAB — HEMOLYSIS INDEX: Hemolysis Index: 7 (ref 0–18)

## 2020-06-30 ENCOUNTER — Other Ambulatory Visit (INDEPENDENT_AMBULATORY_CARE_PROVIDER_SITE_OTHER): Payer: Self-pay | Admitting: Family Medicine

## 2020-06-30 DIAGNOSIS — R7989 Other specified abnormal findings of blood chemistry: Secondary | ICD-10-CM

## 2020-07-01 ENCOUNTER — Encounter (INDEPENDENT_AMBULATORY_CARE_PROVIDER_SITE_OTHER): Payer: Self-pay | Admitting: Family Medicine

## 2020-07-01 ENCOUNTER — Other Ambulatory Visit (FREE_STANDING_LABORATORY_FACILITY): Payer: Commercial Managed Care - POS

## 2020-07-01 DIAGNOSIS — R7989 Other specified abnormal findings of blood chemistry: Secondary | ICD-10-CM

## 2020-07-02 ENCOUNTER — Other Ambulatory Visit (INDEPENDENT_AMBULATORY_CARE_PROVIDER_SITE_OTHER): Payer: Self-pay | Admitting: Family Medicine

## 2020-07-02 ENCOUNTER — Telehealth (INDEPENDENT_AMBULATORY_CARE_PROVIDER_SITE_OTHER): Payer: Self-pay | Admitting: Family Medicine

## 2020-07-02 DIAGNOSIS — D72820 Lymphocytosis (symptomatic): Secondary | ICD-10-CM

## 2020-07-02 LAB — CBC AND DIFFERENTIAL
Absolute NRBC: 0.02 10*3/uL — ABNORMAL HIGH (ref 0.00–0.00)
Basophils Absolute Automated: 0.03 10*3/uL (ref 0.00–0.08)
Basophils Automated: 0.1 %
Eosinophils Absolute Automated: 0.29 10*3/uL (ref 0.00–0.44)
Eosinophils Automated: 1.3 %
Hematocrit: 40.1 % (ref 37.6–49.6)
Hgb: 12.6 g/dL (ref 12.5–17.1)
Immature Granulocytes Absolute: 0.03 10*3/uL (ref 0.00–0.07)
Immature Granulocytes: 0.1 %
Lymphocytes Absolute Automated: 15.77 10*3/uL — ABNORMAL HIGH (ref 0.42–3.22)
Lymphocytes Automated: 71.9 %
MCH: 32.9 pg (ref 25.1–33.5)
MCHC: 31.4 g/dL — ABNORMAL LOW (ref 31.5–35.8)
MCV: 104.7 fL — ABNORMAL HIGH (ref 78.0–96.0)
MPV: 12.9 fL — ABNORMAL HIGH (ref 8.9–12.5)
Monocytes Absolute Automated: 2.11 10*3/uL — ABNORMAL HIGH (ref 0.21–0.85)
Monocytes: 9.6 %
Neutrophils Absolute: 3.69 10*3/uL (ref 1.10–6.33)
Neutrophils: 17 %
Nucleated RBC: 0.1 /100 WBC — ABNORMAL HIGH (ref 0.0–0.0)
Platelets: 144 10*3/uL (ref 142–346)
RBC: 3.83 10*6/uL — ABNORMAL LOW (ref 4.20–5.90)
RDW: 14 % (ref 11–15)
WBC: 21.92 10*3/uL — ABNORMAL HIGH (ref 3.10–9.50)

## 2020-07-02 LAB — CBC PATHOLOGIST REVIEW

## 2020-07-02 NOTE — Telephone Encounter (Signed)
Pt calls requesting for a call back. Pt would like to know what his white blood count result is. Pt states he does not have his log in information for mychart.       Please advise.

## 2020-07-04 NOTE — Telephone Encounter (Signed)
Already addressed

## 2020-07-05 ENCOUNTER — Ambulatory Visit (INDEPENDENT_AMBULATORY_CARE_PROVIDER_SITE_OTHER): Payer: Commercial Managed Care - POS | Admitting: Cardiovascular Disease

## 2020-07-08 ENCOUNTER — Telehealth (INDEPENDENT_AMBULATORY_CARE_PROVIDER_SITE_OTHER): Payer: Self-pay | Admitting: Family Medicine

## 2020-07-10 NOTE — Telephone Encounter (Signed)
error 

## 2020-07-15 ENCOUNTER — Encounter (INDEPENDENT_AMBULATORY_CARE_PROVIDER_SITE_OTHER): Payer: Commercial Managed Care - POS | Admitting: Cardiovascular Disease

## 2020-07-15 DIAGNOSIS — Z95810 Presence of automatic (implantable) cardiac defibrillator: Secondary | ICD-10-CM

## 2020-07-18 ENCOUNTER — Encounter (INDEPENDENT_AMBULATORY_CARE_PROVIDER_SITE_OTHER): Payer: Self-pay | Admitting: Family Medicine

## 2020-07-25 ENCOUNTER — Ambulatory Visit (INDEPENDENT_AMBULATORY_CARE_PROVIDER_SITE_OTHER): Payer: Commercial Managed Care - POS | Admitting: Cardiovascular Disease

## 2020-07-30 ENCOUNTER — Encounter (INDEPENDENT_AMBULATORY_CARE_PROVIDER_SITE_OTHER): Payer: Self-pay | Admitting: Family Medicine

## 2020-08-05 ENCOUNTER — Encounter (INDEPENDENT_AMBULATORY_CARE_PROVIDER_SITE_OTHER): Payer: Self-pay

## 2020-08-05 NOTE — Progress Notes (Signed)
08/05/2020: Yellow Murj Alert   1. 1 episode of AT duration undetermined, at a rate of 166 bpm It included a 4 beat run of VT at 250 bpm.  2. 3 episodes of NSVT. Longest duration 24 beats at 225 bpm.   Echo 04/29/20 EF 30%.

## 2020-08-09 ENCOUNTER — Other Ambulatory Visit (INDEPENDENT_AMBULATORY_CARE_PROVIDER_SITE_OTHER): Payer: Self-pay

## 2020-08-09 ENCOUNTER — Encounter (INDEPENDENT_AMBULATORY_CARE_PROVIDER_SITE_OTHER): Payer: Self-pay | Admitting: Cardiovascular Disease

## 2020-08-09 ENCOUNTER — Ambulatory Visit (INDEPENDENT_AMBULATORY_CARE_PROVIDER_SITE_OTHER): Payer: Commercial Managed Care - POS | Admitting: Cardiovascular Disease

## 2020-08-09 DIAGNOSIS — I428 Other cardiomyopathies: Secondary | ICD-10-CM

## 2020-08-09 DIAGNOSIS — I5042 Chronic combined systolic (congestive) and diastolic (congestive) heart failure: Secondary | ICD-10-CM

## 2020-08-09 MED ORDER — TORSEMIDE 20 MG PO TABS
60.0000 mg | ORAL_TABLET | Freq: Every morning | ORAL | 1 refills | Status: DC
Start: 2020-08-09 — End: 2020-11-22

## 2020-08-09 MED ORDER — METOLAZONE 5 MG PO TABS
5.0000 mg | ORAL_TABLET | Freq: Once | ORAL | 0 refills | Status: DC | PRN
Start: 2020-08-09 — End: 2020-08-23

## 2020-08-09 MED ORDER — TORSEMIDE 20 MG PO TABS
40.0000 mg | ORAL_TABLET | Freq: Every evening | ORAL | 1 refills | Status: DC
Start: 2020-08-09 — End: 2021-01-29

## 2020-08-09 NOTE — Addendum Note (Signed)
Addended by: Juanito Doom on: 08/09/2020 03:58 PM     Modules accepted: Orders

## 2020-08-09 NOTE — Progress Notes (Signed)
HEART ADVANCED HEART FAILURE SUB-SPECIALTY PROGRESS NOTE    HRT [facility] OFFICE -CARDIOLOGY  [address]  [place] [address]  Dept: [phone]  Dept Fax: [phone]     Patient Name: [name] [name]    Date of Visit:  August 09, 2020  Date of Birth: 19-Aug-1946  AGE: 74 y.o.  Medical Record #: [id number]  Requesting Physician: Zorita Pang, MD    CHIEF COMPLAINT:  No chief complaint on file.      HISTORY OF PRESENT ILLNESS:    I had the pleasure of seeing [name] today as a follow up visit in the Advanced Heart Failure Clinic at [facility]. He is a pleasant 74 y.o. 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  Is noted that his ejection fraction was 15% on echocardiogram in May 2021.  He has grade 3 diastolic dysfunction and mild MR and TR.  He has a Medtronic BiV ICD.  His Coreg 25 mg twice daily, Entresto 97/103 mg twice daily, and Spironolactone 12.5 mg daily.  He is on torsemide 40 mg twice daily.    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    He reports increased shortness of breath during the last 3 to 4 days.  His weight has increased between visits.  He previously weighed 205 pounds now he weighs 224 pounds.  There is been no missed medications or significant change in diet.    PAST MEDICAL HISTORY: He has a past medical history of Acute CHF, Acute systolic (congestive) heart failure (08/2017), Anxiety, CAD (coronary artery disease), Cardiomyopathy, CHF (congestive heart failure) (08/12/2014, 2013), Chronic obstructive pulmonary disease, Coronary artery disease (2011), Depression, echocardiogram (02/2016, 11/2016, 09/2017, 12/20/2017), GERD (gastroesophageal reflux disease), Heart attack (2011), Hyperlipemia, Hypertension, ICD (implantable 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, Ischemic cardiomyopathy, Nonischemic cardiomyopathy, NSTEMI (non-ST elevated myocardial infarction), Nuclear MPI  (06/2016), Pacemaker (2014), Pneumonia (09/2016), Primary cardiomyopathy, Sepsis (08/2017), Syncope and collapse, Type 2 diabetes mellitus, controlled (DX 1998), and Wheeze. He has a past surgical history that includes duodenal ulcer (1973); orthopedic surgery (AGE 30); EGD (N/A, 08/15/2014); Circumcision (AGE 11); Fracture surgery (AGE 25); Hernia repair (AGE 11); Colonoscopy (2009); Tonsillectomy and adenoidectomy (1954); Cardiac defibrillator placement (12/2012); Cardiac pacemaker placement (2014); EGD (1975); EGD, COLONOSCOPY (N/A, 04/04/2018); ECHOCARDIOGRAM, TRANSTHORACIC (02/2016 11/2016 09/2017 12/2017); MPI nuclear study (06/2016); Correction hammer toe; BIV (11/10/2016); ECHOCARDIOGRAM, TRANSTHORACIC (02/2016,11/2016,12/20/2017); Cardiac catheterization (03/2010); and Cardiac catheterization (12/2012).    ALLERGIES: No Known Allergies  MEDICATIONS:   Current Outpatient Medications   Medication Sig   . ALPRAZolam (XANAX) 0.25 MG tablet Take 0.25 mg by mouth nightly as needed   . aspirin EC 81 MG EC tablet Take 81 mg by mouth daily   . atorvastatin (LIPITOR) 40 MG tablet Take 1 tablet (40 mg total) by mouth nightly   . carvedilol (COREG) 25 MG tablet Take 1 tablet (25 mg total) by mouth 2 (two) times daily with meals   . docusate sodium (Colace) 100 MG capsule Take by mouth   . fluticasone furoate-vilanterol (Breo Ellipta) 100-25 MCG/INH Aerosol Pwdr, Breath Activated Inhale 1 puff into the lungs daily   . fluticasone furoate-vilanterol (Breo Ellipta) 100-25 MCG/INH Aerosol Pwdr, Breath Activated Inhale 1 puff into the lungs   . glucose blood (ONE TOUCH ULTRA TEST) test strip 1 each by Other route 2 (two) times daily Use as instructed   . insulin glargine (LANTUS SOLOSTAR) 100 UNIT/ML injection pen ADMINISTER 50 UNITS  UNDER THE SKIN AT BEDTIME PER PATIENT.   Marland Kitchen niacin (NIASPAN) 500 MG CR tablet Take 1 tablet (500 mg total) by mouth 2 (two) times daily niacin ER 500 mg tablet,extended release 24 hr   . PARoxetine  (Paxil) 20 MG tablet Take 1 tablet (20 mg total) by mouth every morning   . sacubitril-valsartan (ENTRESTO) 97-103 MG Tab per tablet Take 1 tablet by mouth 2 (two) times daily   . SITagliptin-metFORMIN (Janumet) 50-1000 MG tablet Take 1 tablet by mouth 2 (two) times daily with meals   . spironolactone (ALDACTONE) 25 MG tablet Take 0.5 tablets (12.5 mg total) by mouth daily   . tamsulosin (FLOMAX) 0.4 MG Cap TAKE 1 CAPSULE(0.4 MG) BY MOUTH DAILY AFTER DINNER   . torsemide (DEMADEX) 20 MG tablet Take 2 tablets (40 mg total) by mouth 2 (two) times daily        FAMILY HISTORY: family history includes Breast cancer in his mother; Coronary artery disease in his mother; Diabetes in his brother and mother; Heart attack in his father; Heart attack (age of onset: 35) in his mother; Hypertension in his father and mother.  SOCIAL HISTORY: He reports that he has never smoked. He has never used smokeless tobacco. He reports that he does not drink alcohol and does not use drugs.    PHYSICAL EXAMINATION  There were no vitals taken for this visit.  Last 3 Weights:   Weight Monitoring 04/01/2020 04/18/2020 06/28/2020   Height 182.9 cm - -   Height Method - - -   Weight 94.802 kg 93.532 kg 92.806 kg   Weight Method - - -   BMI (calculated) 28.4 kg/m2 - -       General Appearance:  A well-appearing male in no acute distress.    Skin: Warm and dry to touch, no apparent skin lesions  Head: Normocephalic, normal hair pattern  Eyes: EOMS Intact, conjunctivae and lids unremarkable.    ENT: Ears, Nose and throat reveal no gross abnormalities.  No pallor or cyanosis.   Neck: JVP is mildly elevated  Chest: Clear to auscultation bilaterally with good air movement and respiratory effort and no wheezes, rales, or rhonchi   Cardiovascular: Regular rhythm, S1 normal, S2 normal, No S3 or S4. Apical impulse not displaced,. No murmur. No gallops or rubs detected   Abdomen: Soft, nontender, nondistended, with normoactive bowel sounds. No organomegaly.     Extremities: Warm 1+ edema. No clubbing, or cyanosis.  Radial pulses are full and equal.   Neuro: Alert and oriented x3. No gross motor or sensory deficits noted, affect appropriate.    LABS:   Lab Results   Component Value Date    WBC 21.92 (H) 07/01/2020    HGB 12.6 07/01/2020    HCT 40.1 07/01/2020    PLT 144 07/01/2020     Lab Results   Component Value Date    GLU 221 (H) 06/28/2020    BUN 19.0 06/28/2020    CREAT 1.5 06/28/2020    NA 138 06/28/2020    K 4.7 06/28/2020    CL 105 06/28/2020    CO2 24 06/28/2020    CA 9.5 06/28/2020    PROT 6.9 06/28/2020    AST 13 06/28/2020    ALT 13 06/28/2020    BILITOTAL 0.5 06/28/2020    GLOB 2.6 06/28/2020    ALB 4.3 06/28/2020     Lab Results   Component Value Date    TSH 0.74 08/12/2014    CK  224 (H) 12/29/2013    BNP 956.2 (H) 09/18/2017    BNP 1,792.4 (H) 09/16/2017    BNP 1,551.7 (H) 08/24/2017    HGBA1C 7.6 (H) 06/28/2020     Lab Results   Component Value Date    CHOL 119 11/30/2019    CHOL 110 02/06/2019    TRIG 85 11/30/2019    TRIG 111 02/06/2019    HDL 37 (L) 11/30/2019    HDL 36 (L) 02/06/2019    LDL 65 11/30/2019    LDL 52 02/06/2019    LABVLDL 17 11/30/2019    LABVLDL 22 02/06/2019    CHOLHDLRATIO 3.2 11/30/2019    CHOLHDLRATIO 3.1 02/06/2019     IMPRESSION / RECOMMENDATIONS:   1. [name] [name] a 74 y.o.male with chronic systolic heart failure due to nonischemic cardiomyopathy, ACC/AHA stage C, NYHA class 3. Today he is feeling well and is euvolemic on exam.  The last LVEF was 15% demonstrated by echocardiogram in May 2021 and 20-25% in July 2021.    2. Goal directed therapy includes:  a. Beta blocker: Carvedilol 25 mg twice daily  b. ACE/ARB/ARNI: Entresto 97/103 mg twice daily  c. MRA: Spironolactone 12.5 mg daily    3. Current diuretic regimen: Torsemide 40 mg twice daily  1. This will be increased to torsemide 60 mg in the morning and 40 mg in the afternoon  2. Metolazone 5 mg once daily as needed has been prescribed due to the shortness of breath, JVD,  abdominal distention, and weight gain.  3. We will give him a call next week to check on his symptoms after having taken metolazone. [phone]    4. At the next visit we will likely prescribe an SGLT2i.    5. Next follow up will be with Conception Oms, NP in 3 to 4 weeks..                                                   No orders of the defined types were placed in this encounter.      No orders of the defined types were placed in this encounter.      SIGNED:    Marianna Fuss, MD    This note was generated by the Dragon speech recognition and may contain errors or omissions not intended by the user. Grammatical errors, random word insertions, deletions, pronoun errors, and incomplete sentences are occasional consequences of this technology due to software limitations. Not all errors are caught or corrected. If there are questions or concerns about the content of this note or information contained within the body of this dictation, they should be addressed directly with the author for clarification.

## 2020-08-12 ENCOUNTER — Telehealth (INDEPENDENT_AMBULATORY_CARE_PROVIDER_SITE_OTHER): Payer: Self-pay

## 2020-08-12 NOTE — Telephone Encounter (Signed)
Called and left VM to call back so we can follow up on symptoms. This is regarding Dr. Webb Silversmith message: "Please call him on Monday to see how he is doing with diuresis. He had orthopnea and 20# weight gain. Metolazone prescribed. Toresemide increased. [phone]."

## 2020-08-15 ENCOUNTER — Telehealth (INDEPENDENT_AMBULATORY_CARE_PROVIDER_SITE_OTHER): Payer: Self-pay

## 2020-08-15 ENCOUNTER — Other Ambulatory Visit (FREE_STANDING_LABORATORY_FACILITY): Payer: Commercial Managed Care - POS

## 2020-08-15 DIAGNOSIS — I5042 Chronic combined systolic (congestive) and diastolic (congestive) heart failure: Secondary | ICD-10-CM

## 2020-08-15 LAB — BASIC METABOLIC PANEL
Anion Gap: 11 (ref 5.0–15.0)
BUN: 58 mg/dL — ABNORMAL HIGH (ref 9.0–28.0)
CO2: 27 mEq/L (ref 21–29)
Calcium: 9.6 mg/dL (ref 7.9–10.2)
Chloride: 102 mEq/L (ref 100–111)
Creatinine: 2.2 mg/dL — ABNORMAL HIGH (ref 0.5–1.5)
Glucose: 122 mg/dL — ABNORMAL HIGH (ref 70–100)
Potassium: 4.5 mEq/L (ref 3.5–5.1)
Sodium: 140 mEq/L (ref 136–145)

## 2020-08-15 LAB — HEMOLYSIS INDEX: Hemolysis Index: 7 (ref 0–24)

## 2020-08-15 LAB — GFR: EGFR: 35.6

## 2020-08-15 NOTE — Telephone Encounter (Addendum)
Called and spoke with Pt to follow up on weight and symptoms since increase of diuretics to Torsemide 60 mg AM and 40 mg PM as well as the addition of PRN metolazone. Pt's weight has decreased to 217 lbs this morning (from 223 lbs on 10/22). He states his orthopnea "is pretty much gone" and that he is feeling much better. He had been taking his Metolazone 5 mg daily in addition to the Torsemide. He denies c/o of abdominal distention, swelling, doe/sob, or chest fullness. He is planning to get his BMP drawn this afternoon after work. Will relay this to Dr. Webb Silversmith and call Pt if any further recommendations. Pt is also asking for Metolazone refill, will ask Dr. Webb Silversmith and then refill for Pt.   1130 Called and left VM re Dr. Webb Silversmith message to check K level/electrolytes once we receive lab results prior to metolazone refill. Also to continue Torsemide at the 60/40 dose as instructed. Advised for Pt to give me a call if he has any question/concerns.

## 2020-08-16 ENCOUNTER — Other Ambulatory Visit (INDEPENDENT_AMBULATORY_CARE_PROVIDER_SITE_OTHER): Payer: Self-pay | Admitting: Cardiovascular Disease

## 2020-08-23 ENCOUNTER — Other Ambulatory Visit (INDEPENDENT_AMBULATORY_CARE_PROVIDER_SITE_OTHER): Payer: Self-pay

## 2020-08-23 MED ORDER — METOLAZONE 5 MG PO TABS
5.0000 mg | ORAL_TABLET | Freq: Once | ORAL | 0 refills | Status: DC | PRN
Start: 2020-08-23 — End: 2021-01-29

## 2020-08-23 NOTE — Telephone Encounter (Signed)
Pt phoned requesting refill for metolazone. I forwarded the refill. I reviewed with him that his torsemide is 20 mg and he should be taking 3 tabs in am and 2 tabs in pm. He thought his tablets were 40 mg. I asked that he be sure and look at his label closely. He states understanding.

## 2020-08-31 ENCOUNTER — Other Ambulatory Visit (INDEPENDENT_AMBULATORY_CARE_PROVIDER_SITE_OTHER): Payer: Self-pay | Admitting: Cardiovascular Disease

## 2020-09-06 ENCOUNTER — Telehealth (INDEPENDENT_AMBULATORY_CARE_PROVIDER_SITE_OTHER): Payer: Self-pay

## 2020-09-06 DIAGNOSIS — I5042 Chronic combined systolic (congestive) and diastolic (congestive) heart failure: Secondary | ICD-10-CM

## 2020-09-06 NOTE — Telephone Encounter (Signed)
I phoned pt back and reached his vm. I left a detailed message. Lab slip entered.

## 2020-09-06 NOTE — Addendum Note (Signed)
Addended byPriscella Mann on: 09/06/2020 12:57 PM     Modules accepted: Orders

## 2020-09-06 NOTE — Telephone Encounter (Addendum)
Pt phoned to ask about a refill for metolazone. He had labs done on 10/28 and has not heard about he results. He received 10 tabs of metolazone on 11/5. He reports swelling is down some, his wt is 214 today. He is traveling next week for the holiday and would like a refill on metolazone. After hanging up with the pt, I noticed J kim NP had an opening for an appt this afternoon. I attempted to phone pt back to see if he wanted to move his follow up appt up to today. I reached his vm twice and left a message. I will send the questions to Pixie Casino NP for recommendation on metolazone refill.

## 2020-09-06 NOTE — Telephone Encounter (Signed)
I would not refill Metolazone given increased in Creatinine. It looks like his weight is down from 230lbs. I would continue torsemide dose currently. Repeat BMP in 1 week prior to follow up appointment in 12/9. Please review HF signs and symptoms to look out for and when to call us. He should continue daily weights and call if he's having any worsening symptoms. Thanks, Boneta Lucks

## 2020-09-07 ENCOUNTER — Other Ambulatory Visit (INDEPENDENT_AMBULATORY_CARE_PROVIDER_SITE_OTHER): Payer: Self-pay | Admitting: Cardiovascular Disease

## 2020-09-24 ENCOUNTER — Encounter (INDEPENDENT_AMBULATORY_CARE_PROVIDER_SITE_OTHER): Payer: Self-pay | Admitting: Family Medicine

## 2020-09-26 ENCOUNTER — Ambulatory Visit (INDEPENDENT_AMBULATORY_CARE_PROVIDER_SITE_OTHER): Payer: Commercial Managed Care - POS | Admitting: Nurse Practitioner

## 2020-09-26 ENCOUNTER — Encounter (INDEPENDENT_AMBULATORY_CARE_PROVIDER_SITE_OTHER): Payer: Self-pay | Admitting: Nurse Practitioner

## 2020-09-26 VITALS — BP 128/70 | HR 72 | Ht 72.0 in | Wt 213.0 lb

## 2020-09-26 DIAGNOSIS — I428 Other cardiomyopathies: Secondary | ICD-10-CM

## 2020-09-26 DIAGNOSIS — I5022 Chronic systolic (congestive) heart failure: Secondary | ICD-10-CM

## 2020-09-26 NOTE — Progress Notes (Addendum)
[place] HEART ADVANCED HEART FAILURE OFFICE PROGRESS NOTE    HRT [facility] OFFICE -CARDIOLOGY  [address]  [place] [address]  Dept: [phone]  Dept Fax: [phone]       Patient Name: [name] [name]    Date of Visit:  September 26, 2020  Date of Birth: [birth year]  AGE: 74 y.o.  Medical Record #: [id number]      CHIEF COMPLAINT:    Chief Complaint   Patient presents with   . Congestive Heart Failure   . Cardiomyopathy       HISTORY OF PRESENT ILLNESS:    I had the pleasure of seeing Mr. Zohaib Heeney today in follow up in the Advanced Heart Failure Clinic at [facility]. He is a pleasant 74 y.o. 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  He also has a Medtronic BiV ICD.  At last visit, he he was instructed to take torsemide 60 mg in the morning and 40 mg in the evening along with as needed metolazone for volume overload.  He was previously around 205 pounds and had gained weight up to 224 pounds.  He had complained of increased shortness of breath on exertion, which is now resolved at follow-up today.  He has stopped taking metolazone and reports that he continues to lose 1 to 2 pounds a week.  He is down to 210 pounds on his home scale.  Denies of any profound shortness of breath at rest, dizziness, orthopnea, PND, or edema.  He tells me he was up and down flights of stairs without difficulty.     Most recent metabolic panel from 08/15/2020 showed BUN 58, creatinine 2.2 (previously 1.5 in September), potassium 4.5.      PAST MEDICAL HISTORY: He has a past medical history of Acute CHF, Acute systolic (congestive) heart failure (08/2017), Anxiety, CAD (coronary artery disease), Cardiomyopathy, CHF (congestive heart failure) (08/12/2014, 2013), Chronic obstructive pulmonary disease, Coronary artery disease (2011), Depression, echocardiogram (02/2016, 11/2016, 09/2017, 12/20/2017), GERD  (gastroesophageal reflux disease), Heart attack (2011), Hyperlipemia, Hypertension, ICD (implantable 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, Ischemic cardiomyopathy, Nonischemic cardiomyopathy, NSTEMI (non-ST elevated myocardial infarction), 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 He has a past surgical history that includes duodenal ulcer (1973); orthopedic surgery (AGE 5); EGD (N/A, 08/15/2014); Circumcision (AGE 64); Fracture surgery (AGE 88); Hernia repair (AGE 64); Colonoscopy (2009); Tonsillectomy and adenoidectomy (1954); Cardiac defibrillator placement (12/2012); Cardiac pacemaker placement (2014); EGD (1975); EGD, COLONOSCOPY (N/A, 04/04/2018); ECHOCARDIOGRAM, TRANSTHORACIC (02/2016 11/2016 09/2017 12/2017); MPI nuclear study (06/2016); Correction hammer toe; BIV (11/10/2016); ECHOCARDIOGRAM, TRANSTHORACIC (02/2016,11/2016,12/20/2017); Cardiac catheterization (03/2010); and Cardiac catheterization (12/2012).    ALLERGIES: No Known Allergies    MEDICATIONS:   Current Outpatient Medications   Medication Sig   . ALPRAZolam (XANAX) 0.25 MG tablet Take 0.25 mg by mouth nightly as needed   . aspirin EC 81 MG EC tablet Take 81 mg by mouth daily   . atorvastatin (LIPITOR) 40 MG tablet Take 1 tablet (40 mg total) by mouth nightly   . carvedilol (COREG) 25 MG tablet Take 1 tablet (25 mg total) by mouth 2 (two) times daily with meals   . docusate sodium (Colace) 100 MG capsule Take by mouth   . fluticasone furoate-vilanterol (Breo Ellipta) 100-25 MCG/INH Aerosol Pwdr, Breath Activated Inhale 1 puff into the lungs  daily   . glucose blood (ONE TOUCH ULTRA TEST) test strip 1 each by Other route 2 (two) times daily Use as instructed   . insulin glargine (LANTUS SOLOSTAR) 100 UNIT/ML injection pen ADMINISTER 50 UNITS UNDER THE SKIN AT BEDTIME PER PATIENT.   . metOLazone (ZAROXOLYN) 5 MG  tablet Take 1 tablet (5 mg total) by mouth once as needed (shortness of breath/weight gain) Take in the morning with torsemide   . niacin (NIASPAN) 500 MG CR tablet Take 1 tablet (500 mg total) by mouth 2 (two) times daily niacin ER 500 mg tablet,extended release 24 hr   . PARoxetine (Paxil) 20 MG tablet Take 1 tablet (20 mg total) by mouth every morning   . sacubitril-valsartan (ENTRESTO) 97-103 MG Tab per tablet Take 1 tablet by mouth 2 (two) times daily   . SITagliptin-metFORMIN (Janumet) 50-1000 MG tablet Take 1 tablet by mouth 2 (two) times daily with meals   . spironolactone (ALDACTONE) 25 MG tablet Take 0.5 tablets (12.5 mg total) by mouth daily   . tamsulosin (FLOMAX) 0.4 MG Cap TAKE 1 CAPSULE(0.4 MG) BY MOUTH DAILY AFTER DINNER   . torsemide (Demadex) 20 MG tablet Take 3 tablets (60 mg total) by mouth every morning   . torsemide (Demadex) 20 MG tablet Take 2 tablets (40 mg total) by mouth every evening        FAMILY HISTORY: family history includes Breast cancer in his mother; Coronary artery disease in his mother; Diabetes in his brother and mother; Heart attack in his father; Heart attack (age of onset: 15) in his mother; Hypertension in his father and mother.    SOCIAL HISTORY: He reports that he has never smoked. He has never used smokeless tobacco. He reports that he does not drink alcohol and does not use drugs.      PHYSICAL EXAMINATION    Visit Vitals  BP 128/70 (BP Site: Left arm, Patient Position: Sitting, Cuff Size: Large)   Pulse 72   Ht 1.829 m (6')   Wt 96.6 kg (213 lb)   BMI 28.89 kg/m       Last 3 Weights:   Weight Monitoring 06/28/2020 08/09/2020 09/26/2020   Height - 182.9 cm 182.9 cm   Height Method - - -   Weight 92.806 kg 101.515 kg 96.616 kg   Weight Method - - -   BMI (calculated) - 30.4 kg/m2 28.9 kg/m2         General Appearance:  Cooperative and in no acute distress.    Skin: Warm and dry to touch  Head: Normocephalic  Eyes: Conjunctivae and lids unremarkable.    ENT: wearing mask    Neck: No JVD  Chest: Clear to auscultation bilaterally with good air movement and respiratory effort   Cardiovascular: Regular rhythm, S1 normal, S2 normal, No S3 or S4  Abdomen: Soft, nontender with normoactive bowel sounds.   Extremities: Warm without edema, clubbing, or cyanosis, +2 b/l radial pulses  Neuro: Alert and oriented x3. No gross motor or sensory deficits noted, affect appropriate.          IMPRESSION / RECOMMENDATIONS:   1. Mr. Blossom is a 74 y.o. male with chronic systolic heart failure due to nonischemic cardiomyopathy, ACC/AHA stage C, NYHA class 3. Feeling much better today with improvement in breathing. The last LVEF was 15% demonstrated by echocardiogram in May 2021 and 20-25% in July 2021.     2. Goal directed therapy includes:   Beta blocker: Carvedilol 25 mg twice  daily   ACE/ARB/ARNI: Entresto 97/103 mg twice daily   MRA: Spironolactone 12.5 mg daily   SGLT2-imhibitors: Can consider at next visit pending BMP      3. Current diuretic regimen: Resume Torsemide 40 mg twice daily. He continues to lose weight. Instructed to take additional 1 tab in the AM for weight gain by 2-3lbs in one day or 5-6lbs in one week, worsening shortness of breath, or lower extremity edema   4. Repeat BMP in 7-10 days   5. Follow up in 4 weeks with APP, or sooner if needed                                                    Orders Placed This Encounter   Procedures   . Basic Metabolic Panel   . Advanced Heart Failure APP Visit (HRT [place])       No orders of the defined types were placed in this encounter.        SIGNED:    Conception Oms, NP          This note was generated by the Dragon speech recognition and may contain errors or omissions not intended by the user. Grammatical errors, random word insertions, deletions, pronoun errors, and incomplete sentences are occasional consequences of this technology due to software limitations. Not all errors are caught or corrected. If there are questions or concerns about the  content of this note or information contained within the body of this dictation, they should be addressed directly with the author for clarification.

## 2020-10-14 ENCOUNTER — Encounter (INDEPENDENT_AMBULATORY_CARE_PROVIDER_SITE_OTHER): Payer: Commercial Managed Care - POS | Admitting: Cardiovascular Disease

## 2020-10-14 DIAGNOSIS — Z95810 Presence of automatic (implantable) cardiac defibrillator: Secondary | ICD-10-CM

## 2020-10-24 ENCOUNTER — Other Ambulatory Visit (FREE_STANDING_LABORATORY_FACILITY): Payer: Commercial Managed Care - POS

## 2020-10-24 DIAGNOSIS — I428 Other cardiomyopathies: Secondary | ICD-10-CM

## 2020-10-24 DIAGNOSIS — I5022 Chronic systolic (congestive) heart failure: Secondary | ICD-10-CM

## 2020-10-24 LAB — BASIC METABOLIC PANEL
Anion Gap: 10 (ref 5.0–15.0)
BUN: 26 mg/dL (ref 9.0–28.0)
CO2: 23 mEq/L (ref 21–29)
Calcium: 9.4 mg/dL (ref 7.9–10.2)
Chloride: 106 mEq/L (ref 100–111)
Creatinine: 1.5 mg/dL (ref 0.5–1.5)
Glucose: 129 mg/dL — ABNORMAL HIGH (ref 70–100)
Potassium: 5.1 mEq/L (ref 3.5–5.1)
Sodium: 139 mEq/L (ref 136–145)

## 2020-10-24 LAB — HEMOLYSIS INDEX: Hemolysis Index: 99 — ABNORMAL HIGH (ref 0–24)

## 2020-10-24 LAB — GFR: EGFR: 55.4

## 2020-10-31 ENCOUNTER — Encounter (INDEPENDENT_AMBULATORY_CARE_PROVIDER_SITE_OTHER): Payer: Commercial Managed Care - POS | Admitting: Nurse Practitioner

## 2020-10-31 NOTE — Progress Notes (Deleted)
[place] HEART ADVANCED HEART FAILURE OFFICE PROGRESS NOTE    HRT [facility] OFFICE -CARDIOLOGY  [address]  [place] [address]  Dept: [phone]  Dept Fax: [phone]       Patient Name: [name] [name]    Date of Visit:  October 31, 2020  Date of Birth: [birth year]  AGE: 75 y.o.  Medical Record #: [id number]      CHIEF COMPLAINT:  No chief complaint on file.      HISTORY OF PRESENT ILLNESS:    I had the pleasure of seeing [name] today in follow up in the Advanced Heart Failure Clinic at [facility]. He is a pleasant 75 y.o. male who ***      PAST MEDICAL HISTORY: He has a past medical history of Acute CHF, Acute systolic (congestive) heart failure (08/2017), Anxiety, CAD (coronary artery disease), Cardiomyopathy, CHF (congestive heart failure) (08/12/2014, 2013), Chronic obstructive pulmonary disease, Coronary artery disease (2011), Depression, echocardiogram (02/2016, 11/2016, 09/2017, 12/20/2017), GERD (gastroesophageal reflux disease), Heart attack (2011), Hyperlipemia, Hypertension, ICD (implantable 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, Ischemic cardiomyopathy, Nonischemic cardiomyopathy, NSTEMI (non-ST elevated myocardial infarction), 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 He has a past surgical history that includes duodenal ulcer (1973); orthopedic surgery (AGE 20); EGD (N/A, 08/15/2014); Circumcision (AGE 3); Fracture surgery (AGE 71); Hernia repair (AGE 3); Colonoscopy (2009); Tonsillectomy and adenoidectomy (1954); Cardiac defibrillator placement (12/2012); Cardiac pacemaker placement (2014); EGD (1975); EGD, COLONOSCOPY (N/A, 04/04/2018); ECHOCARDIOGRAM, TRANSTHORACIC (02/2016 11/2016 09/2017 12/2017); MPI nuclear study (06/2016); Correction hammer toe; BIV (11/10/2016);  ECHOCARDIOGRAM, TRANSTHORACIC (02/2016,11/2016,12/20/2017); Cardiac catheterization (03/2010); and Cardiac catheterization (12/2012).    ALLERGIES: No Known Allergies    MEDICATIONS:   Current Outpatient Medications   Medication Sig   . ALPRAZolam (XANAX) 0.25 MG tablet Take 0.25 mg by mouth nightly as needed   . aspirin EC 81 MG EC tablet Take 81 mg by mouth daily   . atorvastatin (LIPITOR) 40 MG tablet Take 1 tablet (40 mg total) by mouth nightly   . carvedilol (COREG) 25 MG tablet Take 1 tablet (25 mg total) by mouth 2 (two) times daily with meals   . docusate sodium (Colace) 100 MG capsule Take by mouth   . fluticasone furoate-vilanterol (Breo Ellipta) 100-25 MCG/INH Aerosol Pwdr, Breath Activated Inhale 1 puff into the lungs daily   . glucose blood (ONE TOUCH ULTRA TEST) test strip 1 each by Other route 2 (two) times daily Use as instructed   . insulin glargine (LANTUS SOLOSTAR) 100 UNIT/ML injection pen ADMINISTER 50 UNITS UNDER THE SKIN AT BEDTIME PER PATIENT.   . metOLazone (ZAROXOLYN) 5 MG tablet Take 1 tablet (5 mg total) by mouth once as needed (shortness of breath/weight gain) Take in the morning with torsemide   . niacin (NIASPAN) 500 MG CR tablet Take 1 tablet (500 mg total) by mouth 2 (two) times daily niacin ER 500 mg tablet,extended release 24 hr   . PARoxetine (Paxil) 20 MG tablet Take 1 tablet (20 mg total) by mouth every morning   . sacubitril-valsartan (ENTRESTO) 97-103 MG Tab per tablet Take 1 tablet by mouth 2 (two) times daily   . SITagliptin-metFORMIN (Janumet) 50-1000 MG tablet Take 1 tablet by mouth 2 (two) times daily with meals   . spironolactone (ALDACTONE) 25 MG tablet Take 0.5 tablets (12.5 mg total) by mouth daily   . tamsulosin (FLOMAX)  0.4 MG Cap TAKE 1 CAPSULE(0.4 MG) BY MOUTH DAILY AFTER DINNER   . torsemide (Demadex) 20 MG tablet Take 3 tablets (60 mg total) by mouth every morning   . torsemide (Demadex) 20 MG tablet Take 2 tablets (40 mg total) by mouth every evening         FAMILY HISTORY: family history includes Breast cancer in his mother; Coronary artery disease in his mother; Diabetes in his brother and mother; Heart attack in his father; Heart attack (age of onset: 60) in his mother; Hypertension in his father and mother.    SOCIAL HISTORY: He reports that he has never smoked. He has never used smokeless tobacco. He reports that he does not drink alcohol and does not use drugs.      PHYSICAL EXAMINATION    There were no vitals taken for this visit.    Last 3 Weights:   Weight Monitoring 06/28/2020 08/09/2020 09/26/2020   Height - 182.9 cm 182.9 cm   Height Method - - -   Weight 92.806 kg 101.515 kg 96.616 kg   Weight Method - - -   BMI (calculated) - 30.4 kg/m2 28.9 kg/m2         General Appearance:  Cooperative and in no acute distress.    Skin: Warm and dry to touch  Head: Normocephalic  Eyes: Conjunctivae and lids unremarkable.    ENT: wearing mask   Neck: No JVD  Chest: Clear to auscultation bilaterally with good air movement and respiratory effort   Cardiovascular: Regular rhythm, S1 normal, S2 normal  Abdomen: Soft, nontender with normoactive bowel sounds.   Extremities: Warm without edema, +2 b/l radial pulses  Neuro: Alert and oriented x3. No gross motor or sensory deficits noted, affect appropriate.                                                         No orders of the defined types were placed in this encounter.      No orders of the defined types were placed in this encounter.        SIGNED:    Conception Oms, NP          This note was generated by the Dragon speech recognition and may contain errors or omissions not intended by the user. Grammatical errors, random word insertions, deletions, pronoun errors, and incomplete sentences are occasional consequences of this technology due to software limitations. Not all errors are caught or corrected. If there are questions or concerns about the content of this note or information contained within the body of this dictation,  they should be addressed directly with the author for clarification.

## 2020-11-05 ENCOUNTER — Encounter (INDEPENDENT_AMBULATORY_CARE_PROVIDER_SITE_OTHER): Payer: Self-pay

## 2020-11-05 NOTE — Progress Notes (Signed)
11/02/2020    1. Patient had 2 episodes of non-sustained VT since Last Medtronic CareLink Monitor Session 03-Aug-2020, longest 09/08/20 7:09 PM, 4s duration with ventricular rate of 214 bpm. No therapy delivered, intervals short of 30/40 needed to treat.    EF 04/29/2020 30%    Everardo Pacific   Cardiac Monitor Technician   [facility]

## 2020-11-06 ENCOUNTER — Other Ambulatory Visit (INDEPENDENT_AMBULATORY_CARE_PROVIDER_SITE_OTHER): Payer: Self-pay

## 2020-11-06 DIAGNOSIS — Z95810 Presence of automatic (implantable) cardiac defibrillator: Secondary | ICD-10-CM

## 2020-11-06 NOTE — Progress Notes (Signed)
Called and left a VM requesting to call us back to schedule in office cardiac device check. Order placed.     Yisroel Ramming   [place] [facility] EP team

## 2020-11-07 ENCOUNTER — Encounter (INDEPENDENT_AMBULATORY_CARE_PROVIDER_SITE_OTHER): Payer: Self-pay | Admitting: Family Medicine

## 2020-11-18 ENCOUNTER — Other Ambulatory Visit (INDEPENDENT_AMBULATORY_CARE_PROVIDER_SITE_OTHER): Payer: Self-pay | Admitting: Family Medicine

## 2020-11-18 DIAGNOSIS — I25700 Atherosclerosis of coronary artery bypass graft(s), unspecified, with unstable angina pectoris: Secondary | ICD-10-CM

## 2020-11-21 ENCOUNTER — Encounter (INDEPENDENT_AMBULATORY_CARE_PROVIDER_SITE_OTHER): Payer: Self-pay | Admitting: Family Medicine

## 2020-11-22 ENCOUNTER — Encounter (INDEPENDENT_AMBULATORY_CARE_PROVIDER_SITE_OTHER): Payer: Self-pay | Admitting: Nurse Practitioner

## 2020-11-22 ENCOUNTER — Ambulatory Visit (INDEPENDENT_AMBULATORY_CARE_PROVIDER_SITE_OTHER): Payer: Commercial Managed Care - POS | Admitting: Nurse Practitioner

## 2020-11-22 VITALS — BP 120/86 | HR 72 | Ht 72.0 in | Wt 212.0 lb

## 2020-11-22 DIAGNOSIS — I5022 Chronic systolic (congestive) heart failure: Secondary | ICD-10-CM

## 2020-11-22 DIAGNOSIS — I428 Other cardiomyopathies: Secondary | ICD-10-CM

## 2020-11-22 MED ORDER — TORSEMIDE 20 MG PO TABS
40.0000 mg | ORAL_TABLET | Freq: Every morning | ORAL | 3 refills | Status: DC
Start: 2020-11-22 — End: 2021-01-29

## 2020-11-22 MED ORDER — SPIRONOLACTONE 25 MG PO TABS
12.5000 mg | ORAL_TABLET | Freq: Every day | ORAL | 3 refills | Status: DC
Start: 2020-11-22 — End: 2021-04-24

## 2020-11-22 NOTE — Progress Notes (Signed)
[place] HEART ADVANCED HEART FAILURE OFFICE PROGRESS NOTE    HRT [facility] OFFICE -CARDIOLOGY  [address]  [place] [address]  Dept: [phone]  Dept Fax: [phone]       Patient Name: [name] [name]    Date of Visit:  November 22, 2020  Date of Birth: 1945-10-22  AGE: 75 y.o.  Medical Record #: [id number]      CHIEF COMPLAINT:    Chief Complaint   Patient presents with   . Congestive Heart Failure   . Follow-up       HISTORY OF PRESENT ILLNESS:    I had the pleasure of seeing [name] today in follow up in the Advanced Heart Failure Clinic at [facility]. He is a pleasant 75 y.o. 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  He also has a Medtronic BiV ICD.  Currently he is on Carvedilol, entresto, and spironolactone for GDMT. He takes torsemide 40mg  once daily with stable stables at home. Denies of any profound shortness of breath at rest, dizziness, orthopnea, PND, or edema.  He tells me he was up and down flights of stairs at school without difficulty.      Most recent metabolic panel from 08/15/2020 showed BUN 58, creatinine 2.2 (previously 1.5 in September), potassium 4.5.      PAST MEDICAL HISTORY: He has a past medical history of Acute CHF, Acute systolic (congestive) heart failure (08/2017), Anxiety, CAD (coronary artery disease), Cardiomyopathy, CHF (congestive heart failure) (08/12/2014, 2013), Chronic obstructive pulmonary disease, Coronary artery disease (2011), Depression, echocardiogram (02/2016, 11/2016, 09/2017, 12/20/2017), GERD (gastroesophageal reflux disease), Heart attack (2011), Hyperlipemia, Hypertension, ICD (implantable 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, Ischemic cardiomyopathy, Nonischemic cardiomyopathy, NSTEMI (non-ST elevated myocardial infarction), Nuclear MPI (06/2016), Pacemaker (2014), Pneumonia (09/2016), Primary cardiomyopathy, Sepsis  (08/2017), Syncope and collapse, Type 2 diabetes mellitus, controlled (DX 1998), and Wheeze. He has a past surgical history that includes duodenal ulcer (1973); orthopedic surgery (AGE 64); EGD (N/A, 08/15/2014); Circumcision (AGE 23); Fracture surgery (AGE 89); Hernia repair (AGE 23); Colonoscopy (2009); Tonsillectomy and adenoidectomy (1954); Cardiac defibrillator placement (12/2012); Cardiac pacemaker placement (2014); EGD (1975); EGD, COLONOSCOPY (N/A, 04/04/2018); ECHOCARDIOGRAM, TRANSTHORACIC (02/2016 11/2016 09/2017 12/2017); MPI nuclear study (06/2016); Correction hammer toe; BIV (11/10/2016); ECHOCARDIOGRAM, TRANSTHORACIC (02/2016,11/2016,12/20/2017); Cardiac catheterization (03/2010); and Cardiac catheterization (12/2012).    ALLERGIES: No Known Allergies    MEDICATIONS:   Current Outpatient Medications   Medication Sig   . ALPRAZolam (XANAX) 0.25 MG tablet Take 0.25 mg by mouth nightly as needed   . aspirin EC 81 MG EC tablet Take 81 mg by mouth daily   . atorvastatin (LIPITOR) 40 MG tablet Take 1 tablet (40 mg total) by mouth nightly   . carvedilol (COREG) 25 MG tablet Take 1 tablet (25 mg total) by mouth 2 (two) times daily with meals   . docusate sodium (Colace) 100 MG capsule Take by mouth   . Entresto 97-103 MG Tab per tablet TAKE 1 TABLET BY MOUTH TWICE DAILY   . fluticasone furoate-vilanterol (Breo Ellipta) 100-25 MCG/INH Aerosol Pwdr, Breath Activated Inhale 1 puff into the lungs daily   . glucose blood (ONE TOUCH ULTRA TEST) test strip 1 each by Other route 2 (two) times daily Use as instructed   . insulin glargine (LANTUS SOLOSTAR) 100 UNIT/ML injection pen ADMINISTER 50 UNITS UNDER THE SKIN AT BEDTIME PER PATIENT.   . metOLazone (ZAROXOLYN) 5 MG tablet  Take 1 tablet (5 mg total) by mouth once as needed (shortness of breath/weight gain) Take in the morning with torsemide   . niacin (NIASPAN) 500 MG CR tablet Take 1 tablet (500 mg total) by mouth 2 (two) times daily niacin ER 500 mg tablet,extended  release 24 hr   . PARoxetine (Paxil) 20 MG tablet Take 1 tablet (20 mg total) by mouth every morning   . SITagliptin-metFORMIN (Janumet) 50-1000 MG tablet Take 1 tablet by mouth 2 (two) times daily with meals   . tamsulosin (FLOMAX) 0.4 MG Cap TAKE 1 CAPSULE(0.4 MG) BY MOUTH DAILY AFTER DINNER   . torsemide (Demadex) 20 MG tablet Take 2 tablets (40 mg total) by mouth every evening   . spironolactone (ALDACTONE) 25 MG tablet Take 0.5 tablets (12.5 mg total) by mouth daily   . torsemide (Demadex) 20 MG tablet Take 2 tablets (40 mg total) by mouth every morning        FAMILY HISTORY: family history includes Breast cancer in his mother; Coronary artery disease in his mother; Diabetes in his brother and mother; Heart attack in his father; Heart attack (age of onset: 47) in his mother; Hypertension in his father and mother.    SOCIAL HISTORY: He reports that he has never smoked. He has never used smokeless tobacco. He reports that he does not drink alcohol and does not use drugs.      PHYSICAL EXAMINATION    Visit Vitals  BP 120/86 (BP Site: Right arm, Patient Position: Sitting, Cuff Size: Large)   Pulse 72   Ht 1.829 m (6')   Wt 96.2 kg (212 lb)   BMI 28.75 kg/m       Last 3 Weights:   Weight Monitoring 08/09/2020 09/26/2020 11/22/2020   Height 182.9 cm 182.9 cm 182.9 cm   Height Method - - -   Weight 101.515 kg 96.616 kg 96.163 kg   Weight Method - - -   BMI (calculated) 30.4 kg/m2 28.9 kg/m2 28.8 kg/m2         General Appearance:  Cooperative and in no acute distress.    Skin: Warm and dry to touch  Head: Normocephalic  Eyes: Conjunctivae and lids unremarkable.    ENT: wearing mask   Neck: No JVD  Chest: Clear to auscultation bilaterally with good air movement and respiratory effort   Cardiovascular: Regular rhythm, S1 normal, S2 normal, No S3 or S4  Abdomen: Soft, nontender with normoactive bowel sounds.   Extremities: Warm without edema, +2 b/l radial pulses  Neuro: Alert and oriented x3. No gross motor or sensory  deficits noted, affect appropriate.      IMPRESSION / RECOMMENDATIONS:   1. Mr. Eisenhour is a 75 y.o. male with chronic systolic heart failure due to nonischemic cardiomyopathy, ACC/AHA stage C, NYHA class 3. The last LVEF was 15% demonstrated by echocardiogram in May 2021 and 20-25% in July 2021.     2. Goal directed therapy includes:   Beta blocker: Carvedilol 25 mg twice daily   ACE/ARB/ARNI: Entresto 97/103 mg twice daily   MRA: Spironolactone 12.5 mg daily   SGLT2-imhibitors: We discussed the cardiovascular benefits of SGLT2 inhibitor in terms of reducing risk of heart failure hospitalizations and worsening heart failure. He would like to discuss with his PCP who is managing his diabetes. He has an appointment next month      3. Current diuretic regimen: Continue Torsemide 40 mg twice daily. He is euvolemic on exam today. Instructed to take additional 1  tab in the AM for weight gain by 2-3lbs in one day or 5-6lbs in one week, worsening shortness of breath, or lower extremity edema   4. Follow up with PCP as scheduled next month. He will discuss SGTL2-inhibitors at that visit   5. Follow up in device clinic   6. Follow up with APP in 2 months, or sooner if needed                                                    Orders Placed This Encounter   Procedures   . Advanced Heart Failure APP Visit (HRT [place])       Orders Placed This Encounter   Medications   . torsemide (Demadex) 20 MG tablet     Sig: Take 2 tablets (40 mg total) by mouth every morning     Dispense:  180 tablet     Refill:  3   . spironolactone (ALDACTONE) 25 MG tablet     Sig: Take 0.5 tablets (12.5 mg total) by mouth daily     Dispense:  45 tablet     Refill:  3         SIGNED:    Conception Oms, NP          This note was generated by the Dragon speech recognition and may contain errors or omissions not intended by the user. Grammatical errors, random word insertions, deletions, pronoun errors, and incomplete sentences are occasional consequences of  this technology due to software limitations. Not all errors are caught or corrected. If there are questions or concerns about the content of this note or information contained within the body of this dictation, they should be addressed directly with the author for clarification.

## 2020-11-27 ENCOUNTER — Encounter (INDEPENDENT_AMBULATORY_CARE_PROVIDER_SITE_OTHER): Payer: Commercial Managed Care - POS

## 2020-12-06 ENCOUNTER — Telehealth (INDEPENDENT_AMBULATORY_CARE_PROVIDER_SITE_OTHER): Payer: Self-pay

## 2020-12-06 NOTE — Telephone Encounter (Signed)
Received call back request from this patient.No answer, left message on voice mail.

## 2020-12-11 ENCOUNTER — Encounter (INDEPENDENT_AMBULATORY_CARE_PROVIDER_SITE_OTHER): Payer: Self-pay | Admitting: Family Medicine

## 2020-12-11 ENCOUNTER — Encounter (INDEPENDENT_AMBULATORY_CARE_PROVIDER_SITE_OTHER): Payer: Commercial Managed Care - POS

## 2020-12-18 ENCOUNTER — Encounter (INDEPENDENT_AMBULATORY_CARE_PROVIDER_SITE_OTHER): Payer: Commercial Managed Care - POS

## 2020-12-25 ENCOUNTER — Encounter (INDEPENDENT_AMBULATORY_CARE_PROVIDER_SITE_OTHER): Payer: Commercial Managed Care - POS

## 2020-12-30 ENCOUNTER — Ambulatory Visit (INDEPENDENT_AMBULATORY_CARE_PROVIDER_SITE_OTHER): Payer: Commercial Managed Care - POS

## 2020-12-30 DIAGNOSIS — Z95810 Presence of automatic (implantable) cardiac defibrillator: Secondary | ICD-10-CM

## 2020-12-31 ENCOUNTER — Other Ambulatory Visit (INDEPENDENT_AMBULATORY_CARE_PROVIDER_SITE_OTHER): Payer: Self-pay | Admitting: Cardiovascular Disease

## 2021-01-07 ENCOUNTER — Encounter (INDEPENDENT_AMBULATORY_CARE_PROVIDER_SITE_OTHER): Payer: Self-pay | Admitting: Family Medicine

## 2021-01-13 ENCOUNTER — Encounter (INDEPENDENT_AMBULATORY_CARE_PROVIDER_SITE_OTHER): Payer: Commercial Managed Care - POS | Admitting: Cardiovascular Disease

## 2021-01-13 ENCOUNTER — Encounter (INDEPENDENT_AMBULATORY_CARE_PROVIDER_SITE_OTHER): Payer: Self-pay | Admitting: Family Medicine

## 2021-01-13 ENCOUNTER — Ambulatory Visit (INDEPENDENT_AMBULATORY_CARE_PROVIDER_SITE_OTHER): Payer: Commercial Managed Care - POS | Admitting: Family Medicine

## 2021-01-13 VITALS — BP 102/67 | HR 71 | Temp 96.3°F | Ht 68.0 in | Wt 225.0 lb

## 2021-01-13 DIAGNOSIS — N183 Chronic kidney disease, stage 3 unspecified: Secondary | ICD-10-CM | POA: Insufficient documentation

## 2021-01-13 DIAGNOSIS — I4729 Other ventricular tachycardia: Secondary | ICD-10-CM

## 2021-01-13 DIAGNOSIS — Z794 Long term (current) use of insulin: Secondary | ICD-10-CM

## 2021-01-13 DIAGNOSIS — Z0000 Encounter for general adult medical examination without abnormal findings: Secondary | ICD-10-CM

## 2021-01-13 DIAGNOSIS — Z95810 Presence of automatic (implantable) cardiac defibrillator: Secondary | ICD-10-CM

## 2021-01-13 DIAGNOSIS — F339 Major depressive disorder, recurrent, unspecified: Secondary | ICD-10-CM

## 2021-01-13 DIAGNOSIS — E118 Type 2 diabetes mellitus with unspecified complications: Secondary | ICD-10-CM

## 2021-01-13 DIAGNOSIS — I472 Ventricular tachycardia: Secondary | ICD-10-CM

## 2021-01-13 LAB — COMPREHENSIVE METABOLIC PANEL
ALT: 13 U/L (ref 0–55)
AST (SGOT): 16 U/L (ref 5–34)
Albumin/Globulin Ratio: 1.4 (ref 0.9–2.2)
Albumin: 4.2 g/dL (ref 3.5–5.0)
Alkaline Phosphatase: 70 U/L (ref 37–117)
Anion Gap: 10 (ref 5.0–15.0)
BUN: 20 mg/dL (ref 9.0–28.0)
Bilirubin, Total: 0.8 mg/dL (ref 0.2–1.2)
CO2: 21 mEq/L (ref 21–29)
Calcium: 9.2 mg/dL (ref 7.9–10.2)
Chloride: 110 mEq/L (ref 100–111)
Creatinine: 1.2 mg/dL (ref 0.5–1.5)
Globulin: 3 g/dL (ref 2.0–3.7)
Glucose: 76 mg/dL (ref 70–100)
Potassium: 4.8 mEq/L (ref 3.5–5.1)
Protein, Total: 7.2 g/dL (ref 6.0–8.3)
Sodium: 141 mEq/L (ref 136–145)

## 2021-01-13 LAB — CBC AND DIFFERENTIAL
Absolute NRBC: 0 10*3/uL (ref 0.00–0.00)
Hematocrit: 45.9 % (ref 37.6–49.6)
Hgb: 14.3 g/dL (ref 12.5–17.1)
MCH: 32.1 pg (ref 25.1–33.5)
MCHC: 31.2 g/dL — ABNORMAL LOW (ref 31.5–35.8)
MCV: 102.9 fL — ABNORMAL HIGH (ref 78.0–96.0)
MPV: 12.9 fL — ABNORMAL HIGH (ref 8.9–12.5)
Nucleated RBC: 0 /100 WBC (ref 0.0–0.0)
Platelets: 127 10*3/uL — ABNORMAL LOW (ref 142–346)
RBC: 4.46 10*6/uL (ref 4.20–5.90)
RDW: 14 % (ref 11–15)
WBC: 23.59 10*3/uL — ABNORMAL HIGH (ref 3.10–9.50)

## 2021-01-13 LAB — MAN DIFF ONLY
Atypical Lymphocytes %: 5 %
Atypical Lymphocytes Absolute: 1.18 10*3/uL — ABNORMAL HIGH (ref 0.00–0.00)
Band Neutrophils Absolute: 0.24 10*3/uL (ref 0.00–1.00)
Band Neutrophils: 1 %
Basophils Absolute Manual: 0 10*3/uL (ref 0.00–0.08)
Basophils Manual: 0 %
Eosinophils Absolute Manual: 0.47 10*3/uL — ABNORMAL HIGH (ref 0.00–0.44)
Eosinophils Manual: 2 %
Lymphocytes Absolute Manual: 18.64 10*3/uL — ABNORMAL HIGH (ref 0.42–3.22)
Lymphocytes Manual: 79 %
Monocytes Absolute: 0.71 10*3/uL (ref 0.21–0.85)
Monocytes Manual: 3 %
Neutrophils Absolute Manual: 2.36 10*3/uL (ref 1.10–6.33)
Segmented Neutrophils: 10 %

## 2021-01-13 LAB — CELL MORPHOLOGY
Cell Morphology: NORMAL
Platelet Estimate: DECREASED — AB

## 2021-01-13 LAB — LIPID PANEL
Cholesterol / HDL Ratio: 5.1
Cholesterol: 185 mg/dL (ref 0–199)
HDL: 36 mg/dL — ABNORMAL LOW (ref 40–9999)
LDL Calculated: 130 mg/dL — ABNORMAL HIGH (ref 0–99)
Triglycerides: 96 mg/dL (ref 34–149)
VLDL Calculated: 19 mg/dL (ref 10–40)

## 2021-01-13 LAB — HEMOGLOBIN A1C
Average Estimated Glucose: 159.9 mg/dL
Hemoglobin A1C: 7.2 % — ABNORMAL HIGH (ref 4.6–5.9)

## 2021-01-13 LAB — MICROALBUMIN, RANDOM URINE
Urine Creatinine, Random: 205.6 mg/dL
Urine Microalbumin, Random: 28 (ref 0.0–30.0)
Urine Microalbumin/Creatinine Ratio: 14 ug/mg (ref 0–30)

## 2021-01-13 LAB — HEMOLYSIS INDEX: Hemolysis Index: 37 — ABNORMAL HIGH (ref 0–24)

## 2021-01-13 LAB — GFR: EGFR: >60

## 2021-01-13 NOTE — Progress Notes (Signed)
Date: 01/13/2021 8:49 AM   Patient ID: [name] [name] is a 75 y.o. male.         Have you seen any specialists/other providers since your last visit with [place]?    No    Arm preference verified?   Yes    The patient is due for shingles vaccine    Subjective:      Chief Complaint:  Chief Complaint   Patient presents with   . Annual Exam     Pt is fasting.       HPI:  Visit Type: Health Maintenance Visit  Work Status: not currently employed  Reported Health: excellent health  Reported Diet: compliant with well-balanced diet  Reported Exercise: 3-4x/week and 1-2x/week  Dental: dentist visit within 6-12 months  Vision: LASIK and eye exam < 1 year ago  Hearing: normal hearing  Immunization Status: Shingles vaccination due  Reproductive Health: sexually active  Prior Screening Tests: no prior PSA testing  General Health Risks: no family history of prostate cancer, no family history of colon cancer, family history of breast cancer and Mother  Safety Elements Used: uses seat belts      Patient feels overall great, is exercising at least 3 times a week and is on a good diet.        Problem List:  Patient Active Problem List   Diagnosis   . Chest pain   . Type 2 diabetes mellitus with complication, with long-term current use of insulin   . Heart attack   . CAD (coronary artery disease)   . Non-ischemic cardiomyopathy   . Acute exacerbation of CHF (congestive heart failure)   . PUD (peptic ulcer disease)   . Elevated LFTs   . Acute dyspnea   . Elevated d-dimer   . CHF (congestive heart failure)   . Headaches due to old head injury   . Essential hypertension   . Pneumonia due to infectious organism, unspecified laterality, unspecified part of lung   . Syncope and collapse   . AICD (automatic cardioverter/defibrillator) present   . Syncope, unspecified syncope type   . Pneumonia due to infectious organism   . Dyspnea, unspecified type   . Cardiomyopathy   . Cough   . Sepsis   . AKI (acute kidney injury)   . Sepsis, due to  unspecified organism   . SOB (shortness of breath)   . Acute on chronic systolic CHF (congestive heart failure)   . History of colon polyps   . Gastroesophageal reflux disease, esophagitis presence not specified   . Hypertensive heart disease without heart failure   . Atherosclerotic heart disease of native coronary artery without angina pectoris   . Heart failure, unspecified   . Nonischemic cardiomyopathy   . BO (blackout)   . Belching   . Acute subendocardial infarction, subsequent episode of care   . Difficulty sleeping   . Dizziness   . Encounter for examination for normal comparison and control in clinical research program   . Fatigue   . Nasal congestion   . Paroxysmal ventricular tachycardia   . Pure hypercholesterolemia   . Tachycardia, unspecified   . Chronic systolic congestive heart failure   . Encounter for implantable defibrillator reprogramming or check   . Episode of recurrent major depressive disorder, unspecified depression episode severity   . 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 ALPRAZolam (XANAX) 0.25 MG tablet Take 0.25 mg by mouth nightly as needed     . aspirin EC 81 MG EC tablet Take 81 mg by mouth daily     . atorvastatin (LIPITOR) 40 MG tablet Take 1 tablet (40 mg total) by mouth nightly 90 tablet 0   . carvedilol (COREG) 25 MG tablet Take 1 tablet (25 mg total) by mouth 2 (two) times daily with meals 180 tablet 3   . docusate sodium (COLACE) 100 MG capsule Take by mouth     . Entresto 97-103 MG Tab per tablet TAKE 1 TABLET BY MOUTH TWICE DAILY 180 tablet 3   . fluticasone furoate-vilanterol (Breo Ellipta) 100-25 MCG/INH Aerosol Pwdr, Breath Activated Inhale 1 puff into the lungs daily 3 each 3   . glucose blood (ONE TOUCH ULTRA TEST) test strip 1 each by Other route 2 (two) times daily  Use as instructed 300 each 1   . metOLazone (ZAROXOLYN) 5 MG tablet Take 1 tablet (5 mg total) by mouth once as needed (shortness of breath/weight gain) Take in the morning with torsemide 10 tablet 0   . niacin (NIASPAN) 500 MG CR tablet Take 1 tablet (500 mg total) by mouth 2 (two) times daily niacin ER 500 mg tablet,extended release 24 hr 180 tablet 3   . PARoxetine (Paxil) 20 MG tablet Take 1 tablet (20 mg total) by mouth every morning 30 tablet 11   . SITagliptin-metFORMIN (Janumet) 50-1000 MG tablet Take 1 tablet by mouth 2 (two) times daily with meals 180 tablet 3   . spironolactone (ALDACTONE) 25 MG tablet Take 0.5 tablets (12.5 mg total) by mouth daily 45 tablet 3   . tamsulosin (FLOMAX) 0.4 MG Cap TAKE 1 CAPSULE(0.4 MG) BY MOUTH DAILY AFTER DINNER 90 capsule 3   . torsemide (Demadex) 20 MG tablet Take 2 tablets (40 mg total) by mouth every evening 180 tablet 1   . torsemide (Demadex) 20 MG tablet Take 2 tablets (40 mg total) by mouth every morning 180 tablet 3       Allergies:  No Known Allergies    Past Medical History:  Past Medical History:   Diagnosis Date   . Acute CHF     NOV  & DEC 2018, - DIALYSIS CATH PLACED Aug 24 2017 TO REMOVE FLUID    . Acute systolic (congestive) heart failure 08/2017   . Anxiety    . CAD (coronary artery disease)    . Cardiomyopathy     nonischemic   . CHF (congestive heart failure) 08/12/2014, 2013   . Chronic obstructive pulmonary disease     POSSIBLE PER PT HE USES  SYNBICORT BID ABD  BREO INHALER PRN   . Coronary artery disease 2011    CORONARY STENT PLACEMENT FOLLOWED BY [facility]   . Depression    . echocardiogram 02/2016, 11/2016, 09/2017, 12/20/2017   . GERD (gastroesophageal reflux disease)    . Heart attack 2011    and 2014   . Hyperlipemia    . Hypertension    . ICD (implantable 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     PLACED 2014  [facility]  LAST INTERROGATION April 01 2018 REPORT REQUESTED   . Ischemic cardiomyopathy     EF 15% ON ECHO 09-20-2017 DR GARG IN [facility]   .  Nonischemic cardiomyopathy    . NSTEMI (non-ST elevated myocardial infarction)    .  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   . Pacemaker 2014    MEDTRONIC ICD/PACEMAKER COMBO LAST INTERROGATION  12-12 2018   . Pneumonia 09/2016   . Primary cardiomyopathy     Ischemic cardiomyopathy EF 15% ON ECHO 09-20-2017 DR GARG IN [facility]   . Sepsis 08/2017   . Syncope and collapse     PRIOR TO ICD/PACEMAKER PLACED 2014   . Type 2 diabetes mellitus, controlled DX 1998    BS  AVG 100  A1C 7.4 Dec 27 2017   . Wheeze     PT SEE HIS PMD April 01 2018 RE THIS-TOLD TO SEE PMD BY [facility] JUNE 12 WHEN THEY SAW HIM FOR A CL       Past Surgical History:  Past Surgical History:   Procedure Laterality Date   . BIV  11/10/2016    ICD METRONIC  UPGRADE    . CARDIAC CATHETERIZATION  03/2010    CORONARY STENT PLACEMENT X 2 PER PT; LM normal, LAD 70-80% distal lesion at apex, 20% lesion mid CFX   . CARDIAC CATHETERIZATION  12/2012   . CARDIAC DEFIBRILLATOR PLACEMENT  12/2012    [facility]   . CARDIAC PACEMAKER PLACEMENT  2014    MEDTRONIC ICD/PACEMAKER PLACED [facility]   . CIRCUMCISION  AGE 89   . COLONOSCOPY  2009   . CORRECTION HAMMER TOE     . duodenal ulcer  1973    STRESS RELATED IN SCHOOL   . ECHOCARDIOGRAM, TRANSTHORACIC  02/2016 11/2016 09/2017 12/2017   . ECHOCARDIOGRAM, TRANSTHORACIC  02/2016,11/2016,12/20/2017   . EGD N/A 08/15/2014    Procedure: EGD;  Surgeon: Colon Branch, MD;  Location: [facility];  Service: Gastroenterology;  Laterality: N/A;  egd w/ bx   . EGD  1975   . EGD, COLONOSCOPY N/A 04/04/2018    Procedure: EGD with bxs, COLONOSCOPY with polypectomy and clipping;  Surgeon: Doyne Keel, MD;  Location: [facility];  Service: Gastroenterology;  Laterality: N/A;   . FRACTURE SURGERY  AGE 89     RT  ANKLE- CAST APPLIED   . HERNIA REPAIR  AGE 89    LEFT INGUINAL HERNIA   . MPI nuclear study  06/2016   . orthopedic surgery  AGE 37    RT foot corrective - HAMMERTOE   . TONSILLECTOMY AND ADENOIDECTOMY  1954       Family  History:  Family History   Problem Relation Age of Onset   . Breast cancer Mother    . Heart attack Mother 53   . Hypertension Mother    . Diabetes Mother    . Coronary artery disease Mother    . Diabetes Brother    . Heart attack Father    . Hypertension Father        Social History:  Social History     Tobacco Use   . Smoking status: Never Smoker   . Smokeless tobacco: Never Used   Vaping Use   . Vaping Use: Never used   Substance Use Topics   . Alcohol use: No     Alcohol/week: 0.0 standard drinks     Comment: occasionally   . Drug use: No        The following sections were reviewed this encounter by the provider:   Tobacco  Allergies  Meds  Problems  Med Hx  Surg Hx  Fam Hx           Vitals:  BP 102/67 (BP  Site: Left arm, Patient Position: Sitting, Cuff Size: Large)   Pulse 71   Temp (!) 96.3 F (35.7 C) (Temporal)   Ht 1.727 m (5\' 8" )   Wt 102.1 kg (225 lb)   SpO2 95%   BMI 34.21 kg/m      ROS:   General/Constitutional:   Denies Change in appetite. Denies Chills. Denies Fatigue. Denies Fever. Denies Weight gain. Denies Weight loss.   Ophthalmologic:   Denies Blurred vision. Denies Diminished visual acuity. Denies Eye Pain.   ENT:   Denies Hearing Loss. Denies Nasal Discharge. Denies Hoarseness. Denies Ear pain. Denies Nosebleed. Denies Sinus pain. Denies Sore throat. Denies Sneezing.   Endocrine:   Denies Polydipsia. Denies Polyuria.   Cardiovascular:   Denies Chest pain. Denies Chest pain with exertion. Denies Leg Claudication. Denies Palpitations. Denies Swelling in hands/feet.   Respiratory:   Denies Paroxysmal Nocturnal Dyspnea. Denies Cough. Denies Orthopnea. Denies Shortness of breath. Denies Daytime Hypersomnolence. Denies Snoring. Denies Witness Apnea. Denies Wheezing.   Gastrointestinal:   Denies Abdominal pain. Denies Blood in stool. Denies Constipation. Denies Diarrhea. Denies Heartburn. Denies Nausea. Denies Vomiting.   Hematology:   Denies Easy bruising. Denies Easy Bleeding.    Genitourinary:   Denies Blood in urine. Denies Nocturia.   Musculoskeletal:   Denies Joint pain. Denies Leg cramps. Denies Weakness in LE. Denies Swollen joints.   Peripheral Vascular:   Denies Cold extremities. Denies Decreased sensation in extremities. Denies Painful extremities.   Skin:   Denies Itching. Denies Change in Mole(s). Denies Rash.   Neurologic:   Denies Balance difficulty. Denies Dizziness. Denies Headache. Denies Pre-Syncope. Denies Memory loss.   Psychiatric:   Denies Anxiety. Denies Depressed mood. Denies Difficulty sleeping. Denies Mood Swings.     Objective:     Examination:   General Examination:   GENERAL APPEARANCE: alert, in no acute distress, well developed, well nourished, oriented to time, place, and person.   HEAD: normal appearance, atraumatic.   EYES: extraocular movement intact (EOMI), pupils equal, round, reactive to light and accommodation, sclera anicteric, conjunctiva clear.   EARS: tympanic membranes normal bilaterally, external canals normal .   NOSE: normal nasal mucosa, no lesions.   ORAL CAVITY: normal oropharynx, normal lips, mucosa moist, no lesions.   THROAT: normal appearance, clear, no erythema.   NECK/THYROID: neck supple, no carotid bruit, carotid pulse 2+ bilaterally, no cervical lymphadenopathy, no neck mass palpated, no jugular venous distention, no thyromegaly.   LYMPH NODES: no palpable adenopathy.   SKIN: good turgor, no rashes, no suspicious lesions.   HEART: S1, S2 normal, no murmurs, rubs, gallops, regular rate and rhythm.   LUNGS: normal effort / no distress, normal breath sounds, clear to auscultation bilaterally, no wheezes, rales, rhonchi.   ABDOMEN: bowel sounds present, no hepatosplenomegaly, soft, nontender, nondistended.   RECTAL: not examined.   MALE GENITOURINARY: no penile lesions or discharge, no testicular mass.   MUSCULOSKELETAL: full range of motion, no swelling or deformity.   EXTREMITIES: no edema, no clubbing, cyanosis, or edema.    PERIPHERAL PULSES: 2+ dorsalis pedis, 2+ posterior tibial.   NEUROLOGIC: nonfocal, cranial nerves 2-12 grossly intact, deep tendon reflexes 2+ symmetrical, normal strength, tone and reflexes, sensory exam intact.   PSYCH: cognitive function intact, mood/affect full range, speech clear.     Assessment/Plan:       1. Well adult exam  - CBC and differential  - Comprehensive metabolic panel  - Lipid panel  - Hemoglobin A1C  - Urine Microalbumin Random  Recommend to continue to push exercise and stay on a good diet . Try to get to the perfect BMI of 25. Start your day with a high fiber diet and  involve the family in your exercise regiment .    2. Episode of recurrent major depressive disorder, unspecified depression episode severity  Controlled

## 2021-01-15 LAB — CBC PATHOLOGIST REVIEW

## 2021-01-21 NOTE — Progress Notes (Signed)
[facility] OFFICE  [address] [place], [place] [address]     [name]    Date of Visit:  03/30/2018  Date of Birth: 1945-11-23  Age: 75 yrs.   Medical Record Number: [id number]  __  CURRENT DIAGNOSES     1. 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 Atherosclerotic heart disease of native coronary artery without angina pectoris, I25.10  3. Cardiomyopathy non-ischemic unspecified, I42.9  4. CHF acute on chronic systolic, I50.23  5. Heart Failure, Unspecified,  I50.9  6. Encounter For Examination For Normal Comparison And Control In Clinical Research Program, Z00.6  7. Device check automatic implantable cardiac defibrillator, Z45.02  8. Presence of automatic (implantable) cardiac defibrillator, Z95.810   9. DM Type II, Uncomplicated,Unspecified, 250.00  10. Hypercholesterolemia, 272.0  11. MI, Acute/Non-Q Subsequent, 410.72  12. Cardiomyopathy, NOS, 425.4  13. Arrhythmia-Ventricular Tachycardia, 427.1  14. Tachycardia, Unspecified,  R00.0  15. Chest pain, unspecified, R07.9  16. Syncope and collapse, R55  __  ALLERGIES     No Known Drug Allergies  __  MEDICATIONS     1. atorvastatin 40 mg tablet, 1 po qd  2. bisacodyl  5 mg tablet,delayed release, 2 po qd prn  3. Breo Ellipta 100 mcg-25 mcg/dose powder for inhalation, Take as Directed  4. Coreg 25 mg tablet, 1 po bid  5. Entresto 97 mg-103 mg tablet, take 1 PO BID  6. esomeprazole magnesium 40 mg capsule,delayed  release, 1 po qd  7. furosemide 40 mg tablet, 2 po bid  8. Humalog U-100 Insulin 100 unit/mL subcutaneous solution, Take as Directed  9. hydrocodone 5 mg-chlorpheniramine 4 mg/5 mL oral solution, take as directed up to every 6 hours as needed  for cough  10. Lantus 100 unit/mL solution, Take as Directed  11. Niaspan Extended-Release 500 mg tablet extended release 24 hr, 1 po qd  12. Robitussin Cough-Chest Congestion DM 5 mg-100 mg/5 mL oral liquid, PRN  13. Symbicort 80 mcg-4.5  mcg/actuation HFA aerosol inhaler, Take  as Directed  14. tamsulosin 0.4 mg capsule, 1 po qpm  15. Xanax 0.25 mg tablet, 1 po q8 hours prn  __  CHIEF COMPLAINT/REASON FOR VISIT   Followup of CHF acute on chronic systolic  __  HISTORY OF PRESENT ILLNESS  The patient is a 75 year old male coming in today for cardiomyopathy  followup as well as perioperative risk assessment for upcoming endoscopy and colonoscopy scheduled for next Tuesday with Dr. Jens Som. At last office visit, I increased the patient's Entresto to the maximum dose. Since then patient states he has been  tolerating this well. He unfortunately does not check his blood pressures at home; however, he is in the process of getting a blood pressure cuff. His lower extremity edema and shortness of breath continue to be improved. He does continue to have some  fatigue. Weight does continue to trend downward. He is down approximately four pounds since last office visit. He is planning to begin cardiac rehab in the near future. He does complain of a productive cough that has been present for a few days. He did  notice a little bit of dizziness when coughing, but otherwise denies any dizziness or faint feeling.  __  PAST HISTORY      Past Medical Illnesses: HTN, DM, Hyperlipidemia, Depression, GERD;  Past Cardiac Illnesses : Nonischemic cardiomyopathy, CAD, NSTEMI, acute systolic congestive heart failure (08/2017); Infectious Diseases: Pneumonia 09/2016, Sepsis 08/2017;  Surgical Procedures: Hernia repair, hammertoe surgery, BIV ICD  Medtronic Upgrade 11/10/16; Trauma History : No previous history of significant trauma.; Cardiology Procedures-Invasive: Cardiac Cath (left) June 2011, Cardiac Cath March 2014, Defibrillator implant  March 2014; Cardiology Procedures-Noninvasive: Echocardiogram May 2017, 11/2016, 09/2017, 12/20/17, MPI (Nuclear) Study September 2017;  Cardiac Cath Results: 6/11- LM normal, LAD 70-80% distal lesion at apex, 20% lesion mid CFX; Left Ventricular Ejection Fraction : LVEF of 15%  documented via echocardiogram on 12/20/2017  ___  FAMILY HISTORY   Mother -- Coronary Artery Disease    __  CARDIAC RISK FACTORS      Tobacco Abuse: has never used tobacco; Family History of Heart Disease: positive;  Hyperlipidemia: positive; Hypertension: positive;   Diabetes Mellitus: positive; Prior History of Heart Disease: positive;  Obesity: positive; Sedentary Life Style:positive; Age :positive; Menopausal:not applicable  __  SOCIAL HISTORY     Alcohol Use: drinks occasionally; Smoking: Does not smoke; Never smoker ([id number]);  Diet: Regular diet; Exercise: No regular exercise;  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  Height: 72.00"   BMI: 27   Pulse: 80/min. Apical Regular        Constitutional: Cooperative, alert and oriented,well developed, well nourished, in no acute distress. Skin:  warm and dry to touch Head: normocephalic Eyes : EOMS intact, conjunctivae and lids normal, glasses ENT: Ears, Nose and throat reveal no gross abnormalities  Neck: +JVD; no carotid bruit Chest: + wheeze and rhonic  that cleared with cough, normal resp effort Cardiac: Regular rhythm, S1 normal, S2 normal, no murmurs, gallops or rubs detected  Abdomen: abdomen soft Peripheral Pulses: pulses full  and equal in all extremities Extremities/Back: No clubbing, cyanosis or edema Neurological : No gross motor or sensory deficits noted, affect appropriate, oriented to time, person and place.   __    Medications added today by the physician:   Coreg 25 mg tablet, 1 po bid, 60        IMPRESSIONS:  1. 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 New York Heart Association functional class III-B symptomatology. Volume   statues:  improved but still has some fluid retention.   2. Nonobstructive coronary artery disease by catheterization in 2014.   3. Normal nuclear stress testing in September of 2017.   4. Status post Medtronic biventricular ICD.   5. History of neurocardiogenic syncope, no documented ventricular  arrhythmias thus far.   6. Hypertension.   7. Type 2 diabetes mellitus on insulin.   8. Chronic kidney disease stage 3.     RECOMMENDATIONS:    1. The patient's perioperative risk assessment is moderate for upcoming low-risk procedure. He  does not exhibit any signs or symptoms of acute coronary syndrome or CHF today. He is an  acceptable candidate to proceed.  2. We will continue  current dosing of Entresto. Blood pressures are on the low end of normal today,  but patient states he is generally asymptomatic. Do recommend he get a blood pressure cuff  and keep a blood pressure diary over the next couple of weeks. He should  report back to [place] if  his pressures are consistently low and/or if he develops any feeling of dizziness or syncope.  3.  Did recommend patient follow up with his PCP for his productive cough with some wheezing and  rhonchi heard on exam today.  Symptoms did clear with his cough, likely some type of upper  respiratory tract infection.  4. We will plan for a three-month followup with Dr. Eliseo Gum or sooner if necessary.    [place], [place]     Tid: [id number]     cc: Zorita Pang MD   ____________________________  TODAYS ORDERS  12 Lead ECG Today  Return Visit 15 MIN CRB 3 months

## 2021-01-21 NOTE — Progress Notes (Signed)
[facility] OFFICE  [address] [place], [place] [address]     [name]    Date of Visit:  09/16/2017  Date of Birth: 07-18-1946  Age: 75 yrs.   Medical Record Number: [id number]  __  CURRENT DIAGNOSES     1. Cardiomyopathy non-ischemic unspecified,  I42.9  2. Heart Failure, Unspecified, I50.9  3. 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 Device check automatic implantable cardiac defibrillator, Z45.02  5. Atherosclerotic heart disease of native coronary artery without  angina pectoris, I25.10  6. Chest pain, unspecified, R07.9  7. Syncope and collapse, R55  8. Icd - [address] Jude, [employer], [employer], V45.02  9. DM Type II, Uncomplicated,Unspecified, 250.00  10. Hypercholesterolemia, 272.0  11.  MI, Acute/Non-Q Subsequent, 410.72  12. Cardiomyopathy, NOS, 425.4  13. 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 Advair Diskus 100 mcg-50 mcg/dose powder for  inhalation, Take as Directed  2. amitriptyline 25 mg tablet, 1 po qd  3. atorvastatin 40 mg tablet, 1 po qd  4. bisacodyl 5 mg tablet,delayed release, 2 po qd prn  5. Breo Ellipta 100 mcg-25 mcg/dose powder for inhalation, Take as Directed   6. Coreg 6.25 mg tablet, 1 po bid  7. esomeprazole magnesium 40 mg capsule,delayed release, 1 po qd  8. furosemide 40 mg tablet, 1 po qd  9. Humalog U-100 Insulin 100 unit/mL subcutaneous solution, Take as Directed  10. hydrocodone 5 mg-chlorpheniramine  4 mg/5 mL oral solution, take as directed up to every 6 hours as needed for cough  11. ipratropium-albuterol 0.5 mg-3 mg(2.5 mg base)/3 mL nebulization soln, Take as Directed  12. Lantus 100 unit/mL solution, Take as Directed  13. Niaspan  Extended-Release 500 mg tablet extended release 24 hr, 1 po qd  14. Robitussin Cough-Chest Congestion DM 5 mg-100 mg/5 mL oral liquid, PRN  15. Symbicort 80 mcg-4.5 mcg/actuation HFA aerosol inhaler, Take as Directed  16. tamsulosin 0.4 mg   capsule, 1 po qpm  17. Xanax 0.25 mg tablet, 1 po q8 hours prn  __  CHIEF COMPLAINT/REASON FOR VISIT  Hospital Follow up  __   HISTORY OF PRESENT ILLNESS  Pt is a 74 year old male coming in after hospitalization for acute on chronic systolic CHF, sepsis, acute respiratory failure and A/CKD requiring CRRT. Pt was d/c to rehab  and has been on Lasix 40 mg daily. Over the past 4 days pt has noticed worsening SOB, LE edema, and fatigue. Pt is currently SOB talking with me. O2 sats are in the low 90s with a stable BP and sinus tach noted on EKG. Pt's weight is up 12 lbs from last  OV. Pt admits to feeling very unwell.      __  PAST HISTORY     Past Medical Illnesses : HTN, DM, Hyperlipidemia, Depression, GERD, Pneumonia December 2017;  Past Cardiac Illnesses: Nonischemic cardiomyopathy, CAD, NSTEMI, acute systolic  congestive heart failure (08/2017); Infectious Diseases: Sepsis (08/2017); Surgical Procedures : Hernia repair, hammertoe surgery, BIV ICD Medtronic Upgrade 11/10/16; Trauma History: No previous history of significant trauma.;  Cardiology Procedures-Invasive: Cardiac Cath (left) June 2011, Cardiac Cath March 2014, Defibrillator implant March 2014; Cardiology Procedures-Noninvasive : Echocardiogram May 2017, 2/18, MPI (Nuclear) Study September 2017, Echocardiogram February 2018; Cardiac Cath Results: 6/11- LM normal, LAD 70-80%  distal lesion at apex, 20% lesion mid CFX; Left Ventricular Ejection Fraction: LVEF of 30% documented  via echocardiogram on 12/16/2016   ___  FAMILY HISTORY  Mother -- Coronary Artery Disease    __  CARDIAC RISK FACTORS      Tobacco Abuse: has never used tobacco; Family History of Heart Disease: positive;  Hyperlipidemia: positive; Hypertension: positive;   Diabetes Mellitus: positive; Prior History of Heart Disease: positive;  Obesity: positive; Sedentary Life Style:positive;  AVW:[id number]; Menopausal:not applicable  __   SOCIAL HISTORY    Alcohol Use: drinks occasionally;  Smoking:  Does not smoke; Never smoker ([id number]); Diet: Regular diet;  Exercise: No regular exercise; 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  Height: 72.00"   BMI: 29   Pulse: 104/min. Apical Regular        Constitutional: Cooperative, alert and oriented,well developed, well nourished, in no acute distress. Skin:  Scaly rash BLE Head: normocephalic Eyes: conjunctivae  and lids normal ENT: Ears, Nose and throat reveal no gross abnormalities Neck : JVD present Chest: diminished lung sounds RLL, crackles LLL, increased respiratory effort Cardiac : Regular rhythm, S1 normal, S2 normal, No S3 or S4, Apical impulse not displaced, no murmurs, gallops or rubs detected., tachy Abdomen: abdomen soft  Peripheral Pulses: bilateral radial pulse(s) 2+ Extremities/Back: 2+ bilateral pretibial edema  Neurological: No gross motor or sensory deficits noted, affect appropriate, oriented to time, person and place.   __    Medications added today  by the physician:      IMPRESSIONS:   1. Acute on chronic systolic CHF, significantly volume overloaded.  2. 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  4. Normal myocardial perfusion by nuclear stress testing in September of 2017.   5. Status post St. Jude  primary prevention defibrillator which was just upgraded to a Medtronic   biventricular ICD device in January of 2018.  6. History of syncope in the past that has never been proven to represent ventricular arrhythmias   by device interrogations.  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   7. Hypertension, well controlled.   8. Type 2 diabetes mellitus, on insulin.   9. CKD, most recent admission pt recieved CRRT therapy.       RECOMMENDATIONS:   1. Admit pt through the ER for acute  on chronic CHF. Pt needs IV duiresis with Nephrology  input. Discussed the above with Dr. Delbert Phenix.  cc:   Zorita Pang MD       Demetrius Charity, PA

## 2021-01-21 NOTE — Progress Notes (Signed)
[facility] OFFICE  [address] [place], [place] [address]     TELEMEDICINE VISIT       [name] [name]    Date of Visit:  05/29/2019  Date of Birth: [birth year]  Age: 75 yrs.   Medical Record Number: [id number]  __  CURRENT DIAGNOSES     1. 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 Atherosclerotic heart disease of native coronary artery without angina pectoris, I25.10  3. Cardiomyopathy non-ischemic unspecified, I42.9  4. CHF chronic systolic, I50.22  5. CHF acute on chronic systolic, I50.23   6. Heart Failure, Unspecified, I50.9  7. Encounter For Examination For Normal Comparison And Control In Clinical Research Program, Z00.6  8. Device check automatic implantable cardiac defibrillator, Z45.02  9. Presence of automatic (implantable)  cardiac defibrillator, Z95.810  10. DM Type II, Uncomplicated,Unspecified, 250.00  11. Hypercholesterolemia, 272.0  12. MI, Acute/Non-Q Subsequent, 410.72  13. Cardiomyopathy, NOS, 425.4  14. 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 Tachycardia, Unspecified, R00.0  16. Chest pain, unspecified, R07.9  17. Syncope and collapse, R55  __  ALLERGIES     No Known Drug Allergies  __  MEDICATIONS     1. Lantus 100 unit/mL solution, Take as Directed   2. Niaspan Extended-Release 500 mg tablet extended release 24 hr, 1 po qd  3. bisacodyl 5 mg tablet,delayed release, 2 po qd prn  4. tamsulosin 0.4 mg capsule, 1 po qpm  5. Symbicort 80 mcg-4.5 mcg/actuation HFA aerosol inhaler, Take as Directed   6. Xanax 0.25 mg tablet, 1 po q8 hours prn  7. Humalog U-100 Insulin 100 unit/mL subcutaneous solution, Take as Directed  8. Breo Ellipta 100 mcg-25 mcg/dose powder for inhalation, Take as Directed  9. furosemide 40 mg tablet, 2 po bid   10. Coreg 25 mg tablet, 1 po bid  11. Atorvastatin 40 Mg Tablet, 1 po qd  12. Entresto 97 mg-103 mg tablet, take 1 PO BID  __  CHIEF COMPLAINT/REASON FOR VISIT   Followup of Cardiomyopathy non-ischemic unspecified  __  HISTORY OF  PRESENT ILLNESS  The visit today was conducted via telemedicine due to COVID-19  precautions. The patient was in their home and was informed and gave verbal consent to proceed. [name] [name] and I reconnected via Zoom for our follow-up telemedicine visit. I last saw him four months ago in April and things were going well. Things are  continuing to go well for him from a congestive heart failure standpoint and he displays New York Heart Association functional class I symptomatology currently. He is on Entresto and carvedilol at high doses as well as furosemide 80 mg twice daily. We  had thought about adding a spironolactone to the regimen previously but issues with hyperkalemia and borderline renal function made [place] hesitant. Currently, he displays New York Heart Association functional class I symptomatology so I do not even know  if this will help him. He currently denies any chest pain, pressure, burning, squeezing, or tightness. He denies any shortness of breath, exertional dyspnea, orthopnea, or paroxysmal nocturnal dyspnea. He has no significant edema. His weight is creeping  up, but he assures me this is adipose and not fluid.   __  PAST HISTORY     Past Medical  Illnesses: HTN, DM, Hyperlipidemia, Depression, GERD;  Past Cardiac Illnesses : Nonischemic cardiomyopathy, CAD, NSTEMI, acute systolic congestive heart failure (08/2017); Infectious Diseases: Pneumonia 09/2016, Sepsis 08/2017;  Surgical Procedures: Hernia repair, hammertoe surgery, BIV ICD Medtronic Upgrade 11/10/16; Trauma  History : No previous history of significant trauma.; Cardiology Procedures-Invasive: Cardiac Cath (left) June 2011, Cardiac Cath March 2014, Defibrillator implant  March 2014; Cardiology Procedures-Noninvasive: Echocardiogram May 2017, 11/2016, 09/2017, 12/20/17, MPI (Nuclear) Study September 2017;  Cardiac Cath Results: 6/11- LM normal, LAD 70-80% distal lesion at apex, 20% lesion mid CFX; Left Ventricular Ejection Fraction : LVEF of  15% documented via echocardiogram on 12/20/2017  ___  FAMILY HISTORY  Mother --  Coronary Artery Disease    __  CARDIAC RISK FACTORS     Tobacco Abuse: has never used tobacco;  Family History of Heart Disease: positive; Hyperlipidemia: positive;  Hypertension: positive;  Diabetes Mellitus: positive;  Prior History of Heart Disease: positive; Obesity: positive;  Sedentary Life Style:positive; ZOX:[id number]; Menopausal :not applicable  __  SOCIAL HISTORY     Alcohol Use: drinks occasionally; Smoking: Does not smoke; Never smoker ([id number]);  Diet: Regular diet; Exercise: No regular exercise;  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  Height: 72.00"  BMI: 28.88    Pulse: 77/min. pt reported   __    IMPRESSIONS:  1. Chronic systolic congestive heart failure secondary to severe nonischemic cardiomyopathy with an ejection fraction of 15%, unchanged on  multiple recent echocardiograms and status post biventricular implantable cardioverter defibrillator placement. Remarkably stable NYHA functional class I symptomatology.  2. Nonobstructive coronary artery disease by remote catheterization in 2014.    3. Normal nuclear stress test in September of 2017.   4. History of neurocardiogenic syncope with no documented ventricular arrhythmias thus far.  5. Hypertension, adequately controlled.   6. Type 2 diabetes mellitus, insulin dependent, well-controlled  by his report.  7. Chronic kidney disease, stage 3.     RECOMMENDATIONS:  1. Status quo. Continue the current regimen of Entresto, carvedilol, and furosemide for the congestive heart failure/cardiomyopathy.   2. As detailed above, given  the New York Heart Association functional class I symptomatology, prior issues with hyperkalemia and borderline renal function, we are not adding spironolactone to the regimen at this time.   3. Regarding his glycemic  indices, I asked him to discuss  with his primary care physician the potential addition of an SGLT2 inhibitor or GLP1 antagonist to the diabetes regimen. He tells me the sugars have been under excellent control on the current insulin regimen with fasting blood glucose in the 70-110 range  at all times. I told him there are cardiomyopathy/other cardiac benefits outside of glycemic control with these medications that he may discuss further with his primary care provider if they think this may be indicated.   4. Return office visit in  six months. If he has any cardiac concerns in the interim, he will contact the office and I would be happy to see him sooner.     Elliot Dally. Eliseo Gum, MD, [facility]    [facility]/tucbh     cc: Zorita Pang MD  ____________________________   TODAYS ORDERS  Diet mgmt edu, guidance and counseling TODAY  Return Visit 15 MIN 6 months

## 2021-01-21 NOTE — Progress Notes (Signed)
[name] [name]    Date of Visit:  09/29/2017  Date of Birth: [birth year]  Age: 75 yrs.   Medical Record Number: [id number]  __  CURRENT DIAGNOSES     1. Cardiomyopathy non-ischemic unspecified,  I42.9  2. CHF acute on chronic diastolic, I50.33  3. Heart Failure, Unspecified, I50.9  4. 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 Device check automatic implantable cardiac defibrillator, Z45.02  6. Presence of  automatic (implantable) cardiac defibrillator, Z95.810  7. Atherosclerotic heart disease of native coronary artery without angina pectoris, I25.10  8. Tachycardia, Unspecified, R00.0  9. Chest pain, unspecified, R07.9  10. Syncope and  collapse, R55  11. DM Type II, Uncomplicated,Unspecified, 250.00  12. Hypercholesterolemia, 272.0  13. MI, Acute/Non-Q Subsequent, 410.72  14. Cardiomyopathy, NOS, 425.4  15. 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 atorvastatin 40 mg tablet, 1 po qd  2. bisacodyl 5 mg tablet,delayed release, 2 po qd prn  3. Breo Ellipta 100 mcg-25 mcg/dose powder for inhalation, Take as Directed  4. Coreg 25 mg tablet, 1 po bid  5. esomeprazole magnesium  40 mg capsule,delayed release, 1 po qd  6. furosemide 40 mg tablet, 2 po bid  7. Humalog U-100 Insulin 100 unit/mL subcutaneous solution, Take as Directed  8. hydrocodone 5 mg-chlorpheniramine 4 mg/5 mL oral solution, take as directed up to  every 6 hours as needed for cough  9. ipratropium-albuterol 0.5 mg-3 mg(2.5 mg base)/3 mL nebulization soln, Take as Directed  10. Lantus 100 unit/mL solution, Take as Directed  11. lisinopril 5 mg tablet, 1 po q am  12. Niaspan Extended-Release  500 mg tablet extended release 24 hr, 1 po qd  13. Robitussin Cough-Chest Congestion DM 5 mg-100 mg/5 mL oral liquid, PRN  14. Symbicort 80 mcg-4.5 mcg/actuation HFA aerosol inhaler, Take as Directed  15. tamsulosin 0.4 mg capsule, 1 po qpm   16. Xanax 0.25 mg tablet, 1  po q8 hours prn    __  HISTORY OF PRESENT ILLNESS    [name] [name] presents for hospital follow-up visit.     He is a pleasant 75y/o M with chronic systolic heart failure. He was hospitalized in November for acute-on-chronic HFrEF, sepsis, acute respiratory failure, and A/CKD requiring CRRT. When seen at follow-up on 09/16/2017, he was up 12 lbs with SOB, worsening  edema, and fatigue despite Lasix 40mg  daily. He was readmitted to [facility] that day for acute-on-chronic HFrEF.    Repeat echocardiogram on 09/20/2017 showed a significantly reduced EF down to 15%, along with a dilated right ventricle with  severely reduced systolic function. Compared to the previous study in Feb 2018, the LVEF has dropped from 30% to 15% and the degree of valvular regurgitation has increased. He was diuresed and stable for discharge on 09/21/2017 on Lasix 80mg  BID, Coreg  25mg  BID, and lisinopril 10mg  daily.     He has felt better since discharge. We received a call today from his HHN, Marlowe Sax, with report of low BP at 76/46, HR 53 at 11:10 and 118/68, HR 48 at 12:00. Pt was asymptomatic, citing no lightheadedness,  dizziness, presyncope, or syncope. He appears euvolemic today at a weight of 197lbs, which is stable from discharge. He was 215lbs when he was admitted. Pt denies CP, SOB, palpitations, DOE, peripheral edema, claudication, PND, or orthopnea.     ICD was interrogated today: 38% A-paced, 91%  LV-only and 9% Bi-V. Normal device function. 4-brief NSVTs recorded since last follow-up.    __   PAST HISTORY     Past Medical Illnesses: HTN, DM, Hyperlipidemia, Depression, GERD;  Past Cardiac  Illnesses: Nonischemic cardiomyopathy, CAD, NSTEMI, acute systolic congestive heart failure (08/2017); Infectious Diseases : Pneumonia 09/2016, Sepsis 08/2017; Surgical Procedures: Hernia repair, hammertoe surgery, BIV ICD Medtronic Upgrade 11/10/16;  Trauma History: No previous history of significant trauma.; Cardiology  Procedures-Invasive: Cardiac  Cath (left) June 2011, Cardiac Cath March 2014, Defibrillator implant March 2014; Cardiology Procedures-Noninvasive: Echocardiogram May 2017, 2/18, MPI  (Nuclear) Study September 2017, Echocardiogram February 2018, 09/20/17; Cardiac Cath Results: 6/11- LM normal, LAD 70-80% distal lesion at apex, 20% lesion  mid CFX; Left Ventricular Ejection Fraction: LVEF of 15% documented via echocardiogram on 09/20/2017   ___  FAMILY HISTORY  Mother -- Coronary Artery Disease    __  CARDIAC RISK FACTORS      Tobacco Abuse: has never used tobacco; Family History of Heart Disease: positive;  Hyperlipidemia: positive; Hypertension: positive;   Diabetes Mellitus: positive; Prior History of Heart Disease: positive;  Obesity: positive; Sedentary Life Style:positive;  ZOX:WRUEAVWU; Menopausal:not applicable  __   SOCIAL HISTORY    Alcohol Use: drinks occasionally;  Smoking: Does not smoke; Never smoker ([id number]); Diet: Regular diet;  Exercise: No regular exercise; 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  Height:  72.00"  BMI: 27   Pulse: 80/min. Apical Regular        Constitutional: Cooperative, alert and oriented,well developed, well nourished, in no acute distress. Skin:  warm and dry to touch Head: normocephalic Eyes : EOMS intact, conjunctivae and lids normal, glasses ENT: Ears, Nose and throat reveal no gross abnormalities  Neck: no JVD Chest: clear to auscultation bilaterally, normal respiratory excursion  Cardiac: Regular rhythm, S1 normal, S2 normal, no murmurs, gallops or rubs detected Abdomen: abdomen  soft Peripheral Pulses: pulses full and equal in all extremities Extremities/Back : Normal muscle strength and tone., No clubbing, cyanosis or edema Neurological: No gross motor or sensory deficits noted, affect appropriate, oriented  to time,  person and place.   __    Medications added today by the physician:      IMPRESSIONS:   1. Severe nonischemic cardiomyopathy  - EF down to 15% on echo 09/20/2017, down from 30%   in 11/2016.   2. Recurrent admits with acute-on-chronic systolic CHF.  a. ACC C-D; NYHA II at present.  3. 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   4. Normal myocardial perfusion by nuclear stress testing in September of 2017.   5. Status post St. Jude primary prevention defibrillator which was upgraded to a Medtronic   biventricular ICD in Jan 2018.  6. History of syncope in the past  that has never been proven to represent ventricular arrhythmias   by device interrogations. 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   7. Hypertension, well controlled.   8. Type 2 diabetes mellitus, on insulin.   9. CKD - Cr 1.7 on admit, improved  to 1.1 by discharge.     RECOMMENDATIONS:  1. Continue Coreg 25mg  b.i.d. and Lasix 80mg  b.i.d.  2. Reduce lisinopril to 5mg  daily given recent trend to hypotension.  3. Routine resting blood pressure monitoring at home with diary keeping.   4.  Reinforced a low-sodium diet (2000 mg/day).  5. Weigh daily and record. Contact us for any sudden weight gains, such as 2-3lbs in one day or   5lbs in one week for potential diuretic adjustments.  6. Follow-up later this week with PCP (Dr.  Altamease Oiler) as planned.  7. Return visit here later this month with Dr. Eliseo Gum to review/reassess.         cc:   Zorita Pang MD       ____________________________  Christianne Dolin  ICD Clinic Appointment 1 Year  Return Visit 15 MIN First Available - CRB        Carron Curie, PA-C
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
## 2021-01-21 NOTE — Progress Notes (Signed)
[facility] OFFICE  [address] [place], [place] [address]     TELEMEDICINE VISIT       [name] [name]    Date of Visit:  01/18/2019  Date of Birth: 06/07/1946  Age: 75 yrs.   Medical Record Number: [id number]  __  CURRENT DIAGNOSES     1. 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 Atherosclerotic heart disease of native coronary artery without angina pectoris, I25.10  3. Cardiomyopathy non-ischemic unspecified, I42.9  4. CHF chronic systolic, I50.22  5. CHF acute on chronic systolic, I50.23   6. Heart Failure, Unspecified, I50.9  7. Encounter For Examination For Normal Comparison And Control In Clinical Research Program, Z00.6  8. Device check automatic implantable cardiac defibrillator, Z45.02  9. Presence of automatic (implantable)  cardiac defibrillator, Z95.810  10. DM Type II, Uncomplicated,Unspecified, 250.00  11. Hypercholesterolemia, 272.0  12. MI, Acute/Non-Q Subsequent, 410.72  13. Cardiomyopathy, NOS, 425.4  14. 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 Tachycardia, Unspecified, R00.0  16. Chest pain, unspecified, R07.9  17. Syncope and collapse, R55  __  ALLERGIES     No Known Drug Allergies  __  MEDICATIONS     1. Lantus 100 unit/mL solution, Take as Directed   2. Niaspan Extended-Release 500 mg tablet extended release 24 hr, 1 po qd  3. bisacodyl 5 mg tablet,delayed release, 2 po qd prn  4. tamsulosin 0.4 mg capsule, 1 po qpm  5. Symbicort 80 mcg-4.5 mcg/actuation HFA aerosol inhaler, Take as Directed   6. Xanax 0.25 mg tablet, 1 po q8 hours prn  7. Humalog U-100 Insulin 100 unit/mL subcutaneous solution, Take as Directed  8. Breo Ellipta 100 mcg-25 mcg/dose powder for inhalation, Take as Directed  9. furosemide 40 mg tablet, 2 po bid   10. Coreg 25 mg tablet, 1 po bid  11. Atorvastatin 40 Mg Tablet, 1 po qd  12. Entresto 97 mg-103 mg tablet, take 1 PO BID  __  CHIEF COMPLAINT/REASON FOR VISIT   Followup of Cardiomyopathy non-ischemic unspecified  __  HISTORY OF  PRESENT ILLNESS  The visit today was conducted via telemedicine due to COVID-19  precautions. The patient was in their home and was informed and gave verbal consent to proceed. [name] [name] and I connected for our four month follow-up visit today via the Zoom telemedicine platform given the ongoing corona virus pandemic. [name] [name]  has fortunately been doing quite well for the past four months since I last saw him. He has no signs or symptoms of worsening congestive heart failure and denies any chest pain, shortness of breath, orthopnea, paroxysmal nocturnal dyspnea or peripheral  edema. His weight is down five pounds since the last visit, and his blood pressure has been under excellent control via home monitoring. He remains on the stable regimen of carvedilol, Entresto and furosemide.  __   PAST HISTORY     Past Medical Illnesses: HTN, DM, Hyperlipidemia, Depression, GERD;   Past Cardiac Illnesses: Nonischemic cardiomyopathy, CAD, NSTEMI, acute systolic congestive heart failure (08/2017);  Infectious Diseases: Pneumonia 09/2016, Sepsis 08/2017; Surgical Procedures: Hernia repair, hammertoe surgery,  BIV ICD Medtronic Upgrade 11/10/16; Trauma History: No previous history of significant trauma.; Cardiology  Procedures-Invasive: Cardiac Cath (left) June 2011, Cardiac Cath March 2014, Defibrillator implant March 2014; Cardiology Procedures-Noninvasive : Echocardiogram May 2017, 11/2016, 09/2017, 12/20/17, MPI (Nuclear) Study September 2017; Cardiac Cath Results: 6/11- LM normal, LAD 70-80% distal lesion  at apex, 20% lesion mid CFX; Left Ventricular Ejection Fraction: LVEF  of 15% documented via echocardiogram on 12/20/2017   ___  FAMILY HISTORY  Mother -- Coronary Artery Disease    __  CARDIAC RISK FACTORS      Tobacco Abuse: has never used tobacco; Family History of Heart Disease: positive;  Hyperlipidemia: positive; Hypertension: positive;   Diabetes Mellitus: positive; Prior History of Heart Disease: positive;   Obesity: positive; Sedentary Life Style:positive;  BJY:[id number]; Menopausal:not applicable  __   SOCIAL HISTORY    Alcohol Use: drinks occasionally;  Smoking: Does not smoke; Never smoker ([id number]); Diet: Regular diet;  Exercise: No regular exercise; 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  Height: 72.00"  BMI:  28.34       Medications added today by the physician:      IMPRESSIONS:  1. Chronic systolic congestive heart failure secondary to severe nonischemic cardiomyopathy with an ejection fraction of 15%, unchanged on multiple recent  echocardiograms and status post biventricular implantable cardioverter defibrillator placement. Stable NYHA functional class II symptomatology.  2. Nonobstructive coronary artery disease by remote catheterization in 2014.   3. Normal nuclear stress  test in September of 2017.   4. History of neurocardiogenic syncope with no documented ventricular arrhythmias thus far.  5. Hypertension, adequately controlled.   6. Type 2 diabetes mellitus, insulin dependent.   7. Chronic kidney disease,  stage 3.     RECOMMENDATIONS:  1. For now, we will maintain the status quo with the current three drug cardiomyopathy/congestive heart failure medication regimen of Entresto, carvedilol and furosemide.  2. As previously documented, he has  trended towards hyperkalemia previously which gives me pause about the addition of spironolactone to the regimen. Given how clinically stable he is with stable NYHA functional class II symptomatology, I am not going to make any changes to the regimen  today.  3. I would like for him to return for routine follow-up in approximately four months as usual. If there are any cardiac concerns in the interim, I am happy to see him sooner, and he will give the office a call if that is the case.     Elliot Dally. Eliseo Gum, MD, [facility]    CRB/tubbh    cc: Zorita Pang  MD  ____________________________  Christianne Dolin  Return Visit 15 MIN 4 months

## 2021-01-21 NOTE — Progress Notes (Signed)
[facility] OFFICE  [address] [facility] [address] [facility] [facility] [place], [place] [address]     [name] [name]    Date of Visit:  02/24/2018  Date of Birth: 09-Jul-1946  Age: 75 yrs.   Medical Record Number: [id number]  __  CURRENT DIAGNOSES     1. 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 Atherosclerotic heart disease of native coronary artery without angina pectoris, I25.10  3. Cardiomyopathy non-ischemic unspecified, I42.9  4. CHF acute on chronic systolic, I50.23  5. Heart Failure, Unspecified,  I50.9  6. Encounter For Examination For Normal Comparison And Control In Clinical Research Program, Z00.6  7. Device check automatic implantable cardiac defibrillator, Z45.02  8. Presence of automatic (implantable) cardiac defibrillator, Z95.810   9. DM Type II, Uncomplicated,Unspecified, 250.00  10. Hypercholesterolemia, 272.0  11. MI, Acute/Non-Q Subsequent, 410.72  12. Cardiomyopathy, NOS, 425.4  13. Arrhythmia-Ventricular Tachycardia, 427.1  14. Tachycardia, Unspecified,  R00.0  15. Chest pain, unspecified, R07.9  16. Syncope and collapse, R55  __  ALLERGIES     No Known Drug Allergies  __  MEDICATIONS     1. atorvastatin 40 mg tablet, 1 po qd  2. bisacodyl  5 mg tablet,delayed release, 2 po qd prn  3. Breo Ellipta 100 mcg-25 mcg/dose powder for inhalation, Take as Directed  4. Coreg 25 mg tablet, 1 po bid  5. Entresto 97 mg-103 mg tablet, take 1 PO BID  6. esomeprazole magnesium 40 mg capsule,delayed  release, 1 po qd  7. furosemide 40 mg tablet, 2 po bid  8. Humalog U-100 Insulin 100 unit/mL subcutaneous solution, Take as Directed  9. hydrocodone 5 mg-chlorpheniramine 4 mg/5 mL oral solution, take as directed up to every 6 hours as needed  for cough  10. Lantus 100 unit/mL solution, Take as Directed  11. Niaspan Extended-Release 500 mg tablet extended release 24 hr, 1 po qd  12. Robitussin Cough-Chest Congestion DM 5 mg-100 mg/5 mL oral liquid, PRN  13. Symbicort 80 mcg-4.5  mcg/actuation HFA aerosol inhaler, Take  as Directed  14. tamsulosin 0.4 mg capsule, 1 po qpm  15. Xanax 0.25 mg tablet, 1 po q8 hours prn  __  CHIEF COMPLAINT/REASON FOR VISIT   Followup of CHF acute on chronic systolic  __  HISTORY OF PRESENT ILLNESS  [name] is a 75 year old male coming in today for CHF followup.  At last office visit, Dr. Eliseo Gum switched the patients lisinopril to [facility]. Since then, the patient states he is feeling significantly better. His shortness of breath and fatigue have improved, as well as his lower extremity edema. He is down four  pounds from last office visit. Blood pressures are stable.   __  PAST HISTORY     Past Medical  Illnesses: HTN, DM, Hyperlipidemia, Depression, GERD;  Past Cardiac Illnesses : Nonischemic cardiomyopathy, CAD, NSTEMI, acute systolic congestive heart failure (08/2017); Infectious Diseases: Pneumonia 09/2016, Sepsis 08/2017;  Surgical Procedures: Hernia repair, hammertoe surgery, BIV ICD Medtronic Upgrade 11/10/16; Trauma History : No previous history of significant trauma.; Cardiology Procedures-Invasive: Cardiac Cath (left) June 2011, Cardiac Cath March 2014, Defibrillator implant  March 2014; Cardiology Procedures-Noninvasive: Echocardiogram May 2017, 11/2016, 09/2017, 12/20/17, MPI (Nuclear) Study September 2017;  Cardiac Cath Results: 6/11- LM normal, LAD 70-80% distal lesion at apex, 20% lesion mid CFX; Left Ventricular Ejection Fraction : LVEF of 15% documented via echocardiogram on 12/20/2017  ___  FAMILY HISTORY   Mother -- Coronary Artery Disease    __  CARDIAC RISK FACTORS  Tobacco Abuse: has never used tobacco; Family History of Heart Disease: positive;  Hyperlipidemia: positive; Hypertension: positive;   Diabetes Mellitus: positive; Prior History of Heart Disease: positive;  Obesity: positive; Sedentary Life Style:positive; Age :positive; Menopausal:not applicable  __  SOCIAL HISTORY     Alcohol Use: drinks occasionally; Smoking: Does not smoke; Never smoker ([id number]);  Diet:  Regular diet; Exercise: No regular exercise;  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  Height: 72.00"  BMI:  27   Pulse: 76/min. Apical Regular        Constitutional: Cooperative, alert and oriented,well developed, well nourished, in no acute distress. Skin:  warm and dry to touch Head: normocephalic Eyes : EOMS intact, conjunctivae and lids normal, glasses ENT: Ears, Nose and throat reveal no gross abnormalities  Neck: +JVD; no carotid bruit Chest: clear to auscultation  bilaterally, normal respiratory excursion Cardiac: Regular rhythm, S1 normal, S2 normal, no murmurs, gallops or rubs detected  Abdomen: abdomen soft Peripheral Pulses: pulses full  and equal in all extremities Extremities/Back: 1+ bilateral LE edema L  R Neurological : No gross motor or sensory deficits noted, affect appropriate, oriented to time, person and place.   __    Medications added today by the physician:   Entresto 97 mg-103 mg tablet, take 1 PO BID, 60      IMPRESSIONS:  1. Chronic systolic congestive heart failure secondary to severe nonischemic  cardiomyopathy with   an ejection fraction of 15%, unchanged on multiple recent echocardiograms, most recently on   December 20, 2017. New York Heart Association functional class III-B symptomatology. Volume   statues: improved but still has some  fluid retention.  2. Nonobstructive coronary artery disease by catheterization in 2014.   3. Normal nuclear stress testing in September of 2017.   4. Status post Medtronic biventricular ICD.   5. History of neurocardiogenic syncope, no  documented ventricular arrhythmias thus far.   6. Hypertension.   7. Type 2 diabetes mellitus on insulin.   8. Chronic kidney disease stage 3.     RECOMMENDATIONS:  1. We will further  increase the patients Entresto to 97/103. The patient should continue to   monitor blood pressures and heart rates at  home. He should look out for any evidence of   lightheadedness or dizziness and contact us if he develops these symptoms with  increase in   [place].   2. We will check a BMP as well as magnesium.  3. We did discuss the signs and symptoms of CHF today. We did go over, in detail, dietary and   fluid restrictions. I did give the patient handouts for diet and explanation  of heart failure.   4. We will have the patient follow up in a few weeks to reassess how he is doing with the increase   in [place].  5. We will also refer the patient to cardiac rehab.    Demetrius Charity, PA-C    AB/tutbh     cc: Zorita Pang MD    sj  ____________________________  TODAYS ORDERS  Basic Metabolic Panel [facility]  Magnesium [facility]  Heart Failure Clinic Visit 3 weeks  PHASE II CARDIAC REHAB. [facility]

## 2021-01-21 NOTE — Progress Notes (Signed)
[facility] OFFICE  [address] [place], [place] [address]     [name]    Date of Visit:  08/09/2018  Date of Birth: [birth year]  Age: 75 yrs.   Medical Record Number: [id number]  __  CURRENT DIAGNOSES     1. 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 Atherosclerotic heart disease of native coronary artery without angina pectoris, I25.10  3. Cardiomyopathy non-ischemic unspecified, I42.9  4. CHF acute on chronic systolic, I50.23  5. Heart Failure, Unspecified,  I50.9  6. Encounter For Examination For Normal Comparison And Control In Clinical Research Program, Z00.6  7. Device check automatic implantable cardiac defibrillator, Z45.02  8. Presence of automatic (implantable) cardiac defibrillator, Z95.810   9. DM Type II, Uncomplicated,Unspecified, 250.00  10. Hypercholesterolemia, 272.0  11. MI, Acute/Non-Q Subsequent, 410.72  12. Cardiomyopathy, NOS, 425.4  13. Arrhythmia-Ventricular Tachycardia, 427.1  14. Tachycardia, Unspecified,  R00.0  15. Chest pain, unspecified, R07.9  16. Syncope and collapse, R55  __  ALLERGIES     No Known Drug Allergies  __  MEDICATIONS     1. Atorvastatin 40 Mg Tablet, 1 po qd  2. bisacodyl  5 mg tablet,delayed release, 2 po qd prn  3. Breo Ellipta 100 mcg-25 mcg/dose powder for inhalation, Take as Directed  4. Coreg 25 mg tablet, 1 po bid  5. Entresto 97 mg-103 mg tablet, take 1 PO BID  6. furosemide 40 mg tablet, 2 po bid   7. Humalog U-100 Insulin 100 unit/mL subcutaneous solution, Take as Directed  8. hydrocodone 5 mg-chlorpheniramine 4 mg/5 mL oral solution, take as directed up to every 6 hours as needed for cough  9. Lantus 100 unit/mL solution, Take as Directed   10. Niaspan Extended-Release 500 mg tablet extended release 24 hr, 1 po qd  11. Robitussin Cough-Chest Congestion DM 5 mg-100 mg/5 mL oral liquid, PRN  12. Symbicort 80 mcg-4.5 mcg/actuation HFA aerosol inhaler, Take as Directed  13. tamsulosin  0.4 mg capsule, 1 po qpm  14. Xanax  0.25 mg tablet, 1 po q8 hours prn  __  CHIEF COMPLAINT/REASON FOR VISIT  Followup of CHF acute on chronic  systolic  __  HISTORY OF PRESENT ILLNESS  Mr. Kistler returns for scheduled routine four-month follow up. He is doing well and is euvolemic on  examination. He has gained 10 lbs., but this appears to be adipose. His jugular venous pressure was normal and there was no edema and the lungs are clear. He also has no abdominal ascites on examination. He denies any shortness of breath or chest pains.  He had a couple of procedures since I last saw him and he states they were uncomplicated. This included a colonoscopy and some form of thoracic surgical procedure to remove a mass from the chest. The details are scant.   __   PAST HISTORY     Past Medical Illnesses: HTN, DM, Hyperlipidemia, Depression, GERD;   Past Cardiac Illnesses: Nonischemic cardiomyopathy, CAD, NSTEMI, acute systolic congestive heart failure (08/2017);  Infectious Diseases: Pneumonia 09/2016, Sepsis 08/2017; Surgical Procedures: Hernia repair, hammertoe surgery,  BIV ICD Medtronic Upgrade 11/10/16; Trauma History: No previous history of significant trauma.; Cardiology  Procedures-Invasive: Cardiac Cath (left) June 2011, Cardiac Cath March 2014, Defibrillator implant March 2014; Cardiology Procedures-Noninvasive : Echocardiogram May 2017, 11/2016, 09/2017, 12/20/17, MPI (Nuclear) Study September 2017; Cardiac Cath Results: 6/11- LM normal, LAD 70-80% distal lesion  at apex, 20% lesion mid CFX; Left Ventricular Ejection Fraction: LVEF of  15% documented via echocardiogram on 12/20/2017   ___  FAMILY HISTORY  Mother -- Coronary Artery Disease    __  CARDIAC RISK FACTORS      Tobacco Abuse: has never used tobacco; Family History of Heart Disease: positive;  Hyperlipidemia: positive; Hypertension: positive;   Diabetes Mellitus: positive; Prior History of Heart Disease: positive;  Obesity: positive; Sedentary Life Style:positive;  ZOX:WRUEAVWU;  Menopausal:not applicable  __   SOCIAL HISTORY    Alcohol Use: drinks occasionally;  Smoking: Does not smoke; Never smoker ([id number]); Diet: Regular diet;  Exercise: No regular exercise; 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  Height:  72.00"  BMI: 29.02   Pulse: 64/min. Apical  Regular       Constitutional: Cooperative, alert and oriented,well developed, well nourished, in no acute distress.  Skin: warm and dry to touch Head: normocephalic  Eyes: EOMS intact, conjunctivae and lids normal, glasses ENT: Ears, Nose and throat reveal no gross  abnormalities Neck: +JVD; no carotid bruit Chest : + wheeze and rhonic that cleared with cough, normal resp effort Cardiac: Regular rhythm, S1 normal, S2 normal, no murmurs, gallops or rubs detected  Abdomen: abdomen soft Peripheral Pulses: pulses full and equal in all extremities  Extremities/Back: No clubbing, cyanosis or edema Neurological: No gross motor or sensory deficits noted,  affect appropriate, oriented to time, person and place.   __    Medications added today by the physician:      IMPRESSIONS:  1. 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   2. Nonobstructive coronary artery disease by remote catheterization in 2014.   3. Normal nuclear stress  test in September of 2017.   4. History of neurocardiogenic syncope with no documented ventricular arrhythmias thus far.  5. Hypertension, adequately controlled.   6. Type 2 diabetes mellitus, insulin dependent.   7. Chronic kidney disease,  stage 3.     PLAN:  1. Continue current cardiac regimen as prescribe including carvedilol, Entresto, and furosemide.   2. We  briefly discussed the possibility of the addition of spironolactone to the regimen. Given that he appears euvolemic  on examination, I am hesitant. He has also previously had issues with relative hypotension on the high dose of Entresto and borderline hypokalemia around 5.0. We are going to table this idea for now but I may bring this up again at the next office visit,  particularly if he is retaining excess fluid for its diuretic benefits in addition to the cardiac reverse remodeling benefit.   3. Routine office follow up in four months as usual. If he has any cardiac concerns in the interim, he will call me and  I am happy to see him sooner.     Elliot Dally. Nemiah Kissner, M.D., F.A.C.C.  CRB/tubks    cc: Zorita Pang MD

## 2021-01-21 NOTE — Progress Notes (Signed)
[facility] OFFICE  [address] [place], [place] [address]     [name] [name]    Date of Visit:  04/06/2016  Date of Birth: May 21, 1946  Age: 75 yrs.   Medical Record Number: [id number]  __  CURRENT DIAGNOSES     1. Atherosclerotic heart disease of  native coronary artery without angina pectoris, I25.10  2. Cardiomyopathy non-ischemic unspecified, I42.9  3. Syncope and collapse, R55  4. Icd - [address] Jude, [place], [employer], V45.02  5. Encounter For General Adult Medical Examination  Without Abnormal Findings, Z00.00  6. Chest pain, unspecified, R07.9  7. DM Type II, Uncomplicated,Unspecified, 250.00  8. Hypercholesterolemia, 272.0  9. Hypertensive Heart Disease, Benign w/o CHF, 402.10  10. MI, Acute/Non-Q Subsequent,  410.72  11. Cardiomyopathy, NOS, 425.4  12. 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 Exforge 10-320 mg Tablet, 1 po qd  2. Lantus  100 unit/mL solution, Take as Directed  3. Janumet 50-1,000 mg tablet, 1 po q am  4. Niaspan Extended-Release 500 mg tablet extended release 24 hr, 1 po qd  5. aspirin 81 mg tablet,delayed release (DR/EC), 1 po daily  6. Coreg 25 mg tablet,  1 po bid  7. spironolactone 25 mg tablet, 1 po bid  8. Ambien 10 mg tablet, at hs  9. atorvastatin 40 mg tablet, 1 po qd  __  CHIEF COMPLAINT/REASON FOR VISIT   Hospital follow-up  __  HISTORY OF PRESENT ILLNESS  [name] [name] returns for followup of his hospitalization at [facility] six weeks  ago after a syncopal episode. He has a history of cardiomyopathy with an ejection fraction in the 20%-25% range, and it is unclear to me whether this is an ischemic or non-ischemic mechanism. He states that he had bypass surgery performed by when I questioned  him on this, it sounds more like he was referring to his defibrillator implantation and not coronary revascularization. Old records that I have access to demonstrate a cardiac catheterization in  2014 with nonobstructive coronary artery disease and no  mention of any revascularization previously. Therefore, I suspect that he has a nonischemic cardiomyopathy with underlying nonobstructive coronary artery disease as of 2014's catheterization. He had his device interrogated while hospitalized and no ventricular  tachyarrhythmias were revealed. The mechanism of the syncope attributed to orthostasis/potential neurocardiogenic or vasovagal episodes. He was also found to have pneumonia while hospitalized and treated with oral Levaquin on discharge. Today he is feeling  quite well from a cardiac standpoint and denies chest discomfort, shortness of breath at rest or with exertion, palpitations, lightheadedness, dizziness, further syncopal episodes, orthopnea,   Paroxysmal nocturnal dyspnea, or peripheral edema. He  is compliant with his medication regimen which includes excellent cardiomyopathy medicines. He has not been seen by a cardiologist for the last couple of years and is following with his primary care physician, Dr. Altamease Oiler, exclusively. He has had no recent  ischemic evaluation although he did have an echocardiogram while hospitalized which demonstrated the severe cardiomyopathy as mentioned above.  ____  PAST HISTORY      Past Medical Illnesses: HTN, DM, Hyperlipidemia, Depression, GERD;  Past Cardiac Illnesses : Nonischemic cardiomyopathy, CAD, NSTEMI; Infectious Diseases: No previous history of significant infectious diseases.;  Surgical Procedures: Hernia repair, hammertoe surgery; Trauma History: No previous history of significant  trauma.; Cardiology Procedures-Invasive: Cardiac Cath (left) June 2011, Cardiac Cath March 2014, Defibrillator implant March  2014;  Cardiology Procedures-Noninvasive: Echocardiogram May 2017; Cardiac Cath Results: 6/11- LM normal, LAD  70-80% distal lesion at apex, 20% lesion mid CFX; Left Ventricular Ejection Fraction: LVEF of 20% documented via echocardiogram on  02/21/2016   ___  FAMILY HISTORY  Mother -- Coronary Artery Disease     __  CARDIAC RISK FACTORS     Tobacco Abuse: has never used tobacco;  Family History of Heart Disease: positive; Hyperlipidemia: positive;  Hypertension: positive;  Diabetes Mellitus: positive;  Prior History of Heart Disease: positive; Obesity: positive;  Sedentary Life Style:positive; ZOX:WRUEAVWU; Menopausal :not applicable  __  SOCIAL HISTORY    Alcohol Use : drinks occasionally; Smoking: Does not smoke; Never smoker ([id number]); Diet : Regular diet; Exercise: No regular exercise; 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[employer];   __  REVIEW OF SYSTEMS    General : fatigue; Integumentary: Denies any change in hair or  nails, rashes, or skin lesions.; Eyes: wears eye glasses/contact lenses; Ears, Nose, Throat, Mouth : Denies any hearing loss, epistaxis, hoarseness or difficulty speaking.;Respiratory:  Denies dyspnea, cough, wheezing or hemoptysis.; Cardiovascular: Please review HPI;  Abdominal : Denies ulcer disease, hematochezia or melena.;Musculoskeletal :Denies any history of venous insufficiency, arthritic symptoms or back problems.; Neurological  : Denies any history of recurrent strokes, TIA, or seizure disorder.; Psychiatric: Denies any history  of depression, substance abuse or change in cognitive functions.; Endocrine: hyperlipidemia, insulin  dependent diabetes mellitus; Hematologic/Immunologic: Denies any food allergies, seasonal allergies,  bleeding disorders.  __  PHYSICAL EXAMINATION    Vital Signs :  Blood Pressure:  126/90 Sitting, Left arm, large cuff  116/84 Sitting, Right arm, large cuff     Weight: 201.00 lbs.  Height: 72"  BMI:  27   Pulse: 56/min. EKG       Constitutional:  Cooperative, alert and oriented,well developed, well nourished, in no acute distress. Skin: Scaly rash BLE  Head: Normocephalic, normal hair pattern, no masses or tenderness Eyes : EOMS Intact, PERRL, conjunctivae and lids normal. Funduscopic exam and visual fields  not performed. ENT:  Ears, Nose and throat reveal no gross abnormalities. No pallor or cyanosis. Dentition good. Neck: No  palpable masses or adenopathy, no thyromegaly, no JVD, carotid pulses are full and equal bilaterally without bruits. Chest: Normal symmetry, no tenderness  to palpation, normal respiratory excursion, no intercostal retraction, no use of accessory muscles, normal diaphragmatic excursion, clear to auscultation and percussion. Cardiac : Regular rhythm, S1 normal, S2 normal, No S3 or S4, Apical impulse not displaced, no murmurs, gallops or rubs detected. Abdomen: Abdomen soft, bowel sounds  normoactive, no masses, no hepatosplenomegaly, non-tender, no bruits Peripheral Pulses: The femoral,  popliteal, dorsalis pedis, and posterior tibial pulses are full and equal bilaterally with no bruits auscultated. Extremities/Back: No deformities, clubbing,  cyanosis, erythema or edema observed. There are no spinal abnormalities noted. Normal muscle strength and tone. Neurological:  No gross motor or sensory deficits noted, affect appropriate, oriented to time, person and place.   __    Medications added today by the physician:   atorvastatin 40 mg tablet, 1 po qd, 30  atorvastatin 40 mg tablet, 1 po qd, 30    ECG:  Normal sinus rhythm with left ventricular hypertrophy  and marked repolarization abnormality with deep T wave inversions in the lateral leads.    IMPRESSIONS:  1. Severe nonischemic cardiomyopathy with  a left ventricular ejection fraction of 20%.   2. New York Heart Association functional class I symptomatology with no chronic systolic  congestive heart failure symptoms or previous exacerbations.  3. Nonobstructive coronary artery disease  by catheterization, most recently in 2014.  4. Status post St Jude primary prevention implantable cardioverter defibrillator in 2012 for primary   prevention.   5. History of recurrent syncopal episodes that have never been definitively proven  to represent    ventricular tachyarrhythmias.  6. Hypertension, well controlled on the current regimen at 126/90 mmHg.  7. Type 2 diabetes mellitus on insulin.  8. Recent pneumonia in May of 2017.     RECOMMENDATIONS:  1. We will arrange an exercise nuclear stress test to evaluate his coronary circulation. He has a   markedly abnormal ECG with repolarization abnormalities that would preclude standard  plain exercise   treadmill stress testing.  2. I will arrange an EP device check for him in August. He was previously having his device checked every   three to six months, then was lost to followup two years ago until the recent hospitalization  where he had   a device check in May.   3. He can return and see me every six months. He will give [place] a call if he has any concerning cardiopulmonary   concerns in the interim.   4. Regarding a rash that he showed me, I deferred to his  primary care physician, Dr. Altamease Oiler, for further evaluation   of the rash. There have been no recent cardiac medication introductions that I am aware of, so I told him that   I thought it was unlikely that this represents any form of drug reaction  to the medicines that we have him on.     Elliot Dally. Eliseo Gum, MD. [facility]    CRB/tutjm     cc: Zorita Pang MD    SJ  ____________________________  Christianne Dolin   Patient Electronic Access Today  12 Lead ECG Today  Diet mgmt edu, guidance and counseling TODAY  Treadmill Nuclear Study At Patient Convenience  Return Visit 15 MIN 6 months  EP Return Visit 2 months

## 2021-01-21 NOTE — Progress Notes (Signed)
[facility] OFFICE  [address] [place], [place] [address]     [name] [name]    Date of Visit:  01/27/2018  Date of Birth: April 19, 1946  Age: 75 yrs.   Medical Record Number: [id number]  __  CURRENT DIAGNOSES     1. 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 Atherosclerotic heart disease of native coronary artery without angina pectoris, I25.10  3. Cardiomyopathy non-ischemic unspecified, I42.9  4. CHF acute on chronic systolic, I50.23  5. Heart Failure, Unspecified,  I50.9  6. Encounter For Examination For Normal Comparison And Control In Clinical Research Program, Z00.6  7. Device check automatic implantable cardiac defibrillator, Z45.02  8. Presence of automatic (implantable) cardiac defibrillator, Z95.810   9. DM Type II, Uncomplicated,Unspecified, 250.00  10. Hypercholesterolemia, 272.0  11. MI, Acute/Non-Q Subsequent, 410.72  12. Cardiomyopathy, NOS, 425.4  13. Arrhythmia-Ventricular Tachycardia, 427.1  14. Tachycardia, Unspecified,  R00.0  15. Chest pain, unspecified, R07.9  16. Syncope and collapse, R55  __  ALLERGIES     No Known Drug Allergies  __  MEDICATIONS     1. atorvastatin 40 mg tablet, 1 po qd  2. bisacodyl  5 mg tablet,delayed release, 2 po qd prn  3. Breo Ellipta 100 mcg-25 mcg/dose powder for inhalation, Take as Directed  4. Coreg 25 mg tablet, 1 po bid  5. Entresto 49 mg-51 mg tablet, 1 po q12h  6. esomeprazole magnesium 40 mg capsule,delayed  release, 1 po qd  7. furosemide 40 mg tablet, 2 po bid  8. Humalog U-100 Insulin 100 unit/mL subcutaneous solution, Take as Directed  9. hydrocodone 5 mg-chlorpheniramine 4 mg/5 mL oral solution, take as directed up to every 6 hours as needed  for cough  10. ipratropium-albuterol 0.5 mg-3 mg(2.5 mg base)/3 mL nebulization soln, Take as Directed  11. Lantus 100 unit/mL solution, Take as Directed  12. Niaspan Extended-Release 500 mg tablet extended release 24 hr, 1 po qd  13.  Robitussin Cough-Chest Congestion DM 5 mg-100  mg/5 mL oral liquid, PRN  14. Symbicort 80 mcg-4.5 mcg/actuation HFA aerosol inhaler, Take as Directed  15. tamsulosin 0.4 mg capsule, 1 po qpm  16. Xanax 0.25 mg tablet, 1 po q8 hours prn   __  CHIEF COMPLAINT/REASON FOR VISIT  Followup of Cardiomyopathy non-ischemic unspecified  __   HISTORY OF PRESENT ILLNESS  [name] [name] returns for a scheduled three-month followup. When I saw [name] [name] in January, he weighed 193 pounds. Today he weighs 209 pounds. This is likely all fluid weight. He  is in heart failure. His symptoms are largely right-sided with peripheral edema. He is very short of breath, though. His lung exam does not reveal any rales, but his jugular venous pressure is distended. We are going to change his medication regimen around  today to get [name] [name] on Entresto. We are going to start at the 49/51 mg intermediate dosing and will take one tablet every 12 hours starting on Saturday April 13. He last dose of lisinopril was yesterday, April 10. He is not going to take lisinopril anymore  and will allow this to wash out over the next 48 hours. He endorses orthopnea as well as occasional paroxysmal nocturnal dyspnea and severe really reduced exertional tolerance.   __  PAST HISTORY      Past Medical Illnesses: HTN, DM, Hyperlipidemia, Depression, GERD;  Past Cardiac Illnesses : Nonischemic cardiomyopathy, CAD, NSTEMI, acute systolic congestive heart failure (08/2017); Infectious Diseases: Pneumonia 09/2016, Sepsis 08/2017;  Surgical Procedures:  Hernia repair, hammertoe surgery, BIV ICD Medtronic Upgrade 11/10/16; Trauma History : No previous history of significant trauma.; Cardiology Procedures-Invasive: Cardiac Cath (left) June 2011, Cardiac Cath March 2014, Defibrillator implant  March 2014; Cardiology Procedures-Noninvasive: Echocardiogram May 2017, 11/2016, 09/2017, 12/20/17, MPI (Nuclear) Study September 2017;  Cardiac Cath Results: 6/11- LM normal, LAD 70-80% distal lesion at apex, 20% lesion mid CFX; Left  Ventricular Ejection Fraction : LVEF of 15% documented via echocardiogram on 12/20/2017  ___  FAMILY HISTORY   Mother -- Coronary Artery Disease    __  CARDIAC RISK FACTORS      Tobacco Abuse: has never used tobacco; Family History of Heart Disease: positive;  Hyperlipidemia: positive; Hypertension: positive;   Diabetes Mellitus: positive; Prior History of Heart Disease: positive;  Obesity: positive; Sedentary Life Style:positive; Age :positive; Menopausal:not applicable  __  SOCIAL HISTORY     Alcohol Use: drinks occasionally; Smoking: Does not smoke; Never smoker ([id number]);  Diet: Regular diet; Exercise: No regular exercise;  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  Height: 72.00"   BMI: 27   Pulse: 84/min. Apical        Constitutional: Cooperative, alert and oriented,well developed, well nourished, in no acute distress. Skin:  warm and dry to touch Head: normocephalic Eyes : EOMS intact, conjunctivae and lids normal, glasses ENT: Ears, Nose and throat reveal no gross abnormalities  Neck: +JVD; no carotid bruit Chest: clear to auscultation  bilaterally, normal respiratory excursion Cardiac: Regular rhythm, S1 normal, S2 normal, no murmurs, gallops or rubs detected  Abdomen: abdomen soft Peripheral Pulses: pulses full  and equal in all extremities Extremities/Back: 2+ BLE edema to upper tibiae Neurological : No gross motor or sensory deficits noted, affect appropriate, oriented to time, person and place.   __    Medications added today by the physician:   Entresto 49 mg-51 mg tablet, 1 po q12h, 60      IMPRESSIONS:  1. 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 New York Heart Association functional  class III-B  symptomatology.   2. Nonobstructive coronary artery disease by catheterization  in 2014.   3. Normal nuclear stress testing in September of 2017.   4. Status post Medtronic biventricular ICD.   5. History of neurocardiogenic syncope, no documented ventricular arrhythmias thus far.   6. Hypertension.   7. Type  2 diabetes mellitus on insulin.   8. Chronic kidney disease stage 3.     RECOMMENDATIONS:  1. Stop lisinopril. His last dose was Wednesday April  10.   2. Start Entresto 49/51 mg, one tablet every 12 hours beginning on Saturday April 13.   3. Continue the rest of the medication regimen including carvedilol 25 mg every 12 hours as  well as Lasix 80 mg twice a day.   4. The added  diuretic benefits of the Entresto, coupled with his chronic Lasix dose, will  hopefully achieve more diuresis to remove the excess 15 pounds of fluid volume that he is  currently carrying.   5. Would like for [name] [name] to return for close followup  in our nurse practitioner/physician assistant  [facility] in approximately two weeks. At that time,  we should check  a basic metabolic panel, assure that he is tolerating the Entresto and even consider  increasing the  dose if there is still blood pressure room to work with.   6. I have encouraged [name] [name] to call the office if he feels very lightheaded or dizzy as the Entresto  dose that we are starting at carries a much more significant vasodilatory/blood pressure   lowing effect than his current lisinopril 10 mg daily. However, with the blood pressure of  120/96 mmHg, we do have plenty of room to work with.     Elliot Dally. Eliseo Gum, MD, [facility]    CRB/tucbh      cc: HOLGER NOELLE MD    mg   ____________________________  TODAYS ORDERS  Diet_mgmt_edu,_guidance_and_counseling TODAY  Heart Failure  Clinic Visit 2 weeks

## 2021-01-21 NOTE — Progress Notes (Signed)
[facility] OFFICE  [address] [place], [place] [address]     [name]    Date of Visit:  10/21/2017  Date of Birth: 29-Oct-1945  Age: 75 yrs.   Medical Record Number: [id number]  __  CURRENT DIAGNOSES     1. 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 Atherosclerotic heart disease of native coronary artery without angina pectoris, I25.10  3. Cardiomyopathy non-ischemic unspecified, I42.9  4. CHF acute on chronic diastolic, I50.33  5. Heart Failure, Unspecified,  I50.9  6. Device check automatic implantable cardiac defibrillator, Z45.02  7. Presence of automatic (implantable) cardiac defibrillator, Z95.810  8. DM Type II, Uncomplicated,Unspecified, 250.00  9. Hypercholesterolemia, 272.0  10.  MI, Acute/Non-Q Subsequent, 410.72  11. Cardiomyopathy, NOS, 425.4  12. Arrhythmia-Ventricular Tachycardia, 427.1  13. Tachycardia, Unspecified, R00.0  14. Chest pain, unspecified, R07.9  15. Syncope and collapse, R55  __   ALLERGIES    No Known Drug Allergies  __  MEDICATIONS      1. atorvastatin 40 mg tablet, 1 po qd  2. bisacodyl 5 mg tablet,delayed release, 2 po qd prn  3. Breo Ellipta 100 mcg-25 mcg/dose powder for inhalation, Take as Directed  4. Coreg 25 mg tablet, 1 po bid  5. esomeprazole magnesium  40 mg capsule,delayed release, 1 po qd  6. furosemide 40 mg tablet, 2 po bid  7. Humalog U-100 Insulin 100 unit/mL subcutaneous solution, Take as Directed  8. hydrocodone 5 mg-chlorpheniramine 4 mg/5 mL oral solution, take as directed up to  every 6 hours as needed for cough  9. ipratropium-albuterol 0.5 mg-3 mg(2.5 mg base)/3 mL nebulization soln, Take as Directed  10. Lantus 100 unit/mL solution, Take as Directed  11. lisinopril 10 mg tablet, 1 po q am  12. Niaspan Extended-Release  500 mg tablet extended release 24 hr, 1 po qd  13. Robitussin Cough-Chest Congestion DM 5 mg-100 mg/5 mL oral liquid, PRN  14. Symbicort 80 mcg-4.5 mcg/actuation HFA aerosol inhaler, Take as Directed  15.  tamsulosin 0.4 mg capsule, 1 po qpm   16. Xanax 0.25 mg tablet, 1 po q8 hours prn  __  CHIEF COMPLAINT/REASON FOR VISIT  Followup of Cardiomyopathy non-ischemic unspecified  __   HISTORY OF PRESENT ILLNESS  Mr. Aversa returns for scheduled three-week followup after seeing PA Freida Busman on December 12th. He was hypotensive at the time. His home nurse noticed this. His blood pressure  is back up to normal today. It turns out he was doubling up and taking lisinopril and valsartan. We have no records of valsartan in our medication record. This is probably the mechanism for the excessive hypotension. I told him to stop the valsartan.  He will return to the lisinopril 10 mg dosing as well. He remains on carvedilol at 25 mg b.i.d. as well as Lasix 80 mg b.i.d. The weight is down three pounds since the last visit. He is feeling well and euvolemic on examination. He denies any chest pain,  shortness of breath, palpitations, syncope, orthopnea, paroxysmal nocturnal dyspnea, or peripheral edema. He is a candidate for the GALACTIC-HF Clinical Trial. He is interested in learning more and I will refer him to our Research Team.   __   PAST HISTORY     Past Medical Illnesses: HTN, DM, Hyperlipidemia, Depression, GERD;   Past Cardiac Illnesses: Nonischemic cardiomyopathy, CAD, NSTEMI, acute systolic congestive heart failure (08/2017);  Infectious Diseases: Pneumonia 09/2016, Sepsis 08/2017; Surgical Procedures: Hernia repair, hammertoe surgery,  BIV ICD  Medtronic Upgrade 11/10/16; Trauma History: No previous history of significant trauma.; Cardiology  Procedures-Invasive: Cardiac Cath (left) June 2011, Cardiac Cath March 2014, Defibrillator implant March 2014; Cardiology Procedures-Noninvasive : Echocardiogram May 2017, 2/18, MPI (Nuclear) Study September 2017, Echocardiogram February 2018, 09/20/17; Cardiac Cath Results: 6/11- LM normal, LAD  70-80% distal lesion at apex, 20% lesion mid CFX; Left Ventricular Ejection Fraction: LVEF of  15% documented via echocardiogram on 09/20/2017   ___  FAMILY HISTORY  Mother -- Coronary Artery Disease    __  CARDIAC RISK FACTORS      Tobacco Abuse: has never used tobacco; Family History of Heart Disease: positive;  Hyperlipidemia: positive; Hypertension: positive;   Diabetes Mellitus: positive; Prior History of Heart Disease: positive;  Obesity: positive; Sedentary Life Style:positive;  ZOX:[id number]; Menopausal:not applicable  __   SOCIAL HISTORY    Alcohol Use: drinks occasionally;  Smoking: Does not smoke; Never smoker ([id number]); Diet: Regular diet;  Exercise: No regular exercise; 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  Height: 72.00"   BMI: 26   Pulse: 84/min. Apical Regular        Constitutional: Cooperative, alert and oriented,well developed, well nourished, in no acute distress. Skin:  warm and dry to touch Head: normocephalic Eyes : EOMS intact, conjunctivae and lids normal, glasses ENT: Ears, Nose and throat reveal no gross abnormalities  Neck: no JVD Chest: clear to auscultation bilaterally, normal respiratory excursion  Cardiac: Regular rhythm, S1 normal, S2 normal, no murmurs, gallops or rubs detected Abdomen: abdomen  soft Peripheral Pulses: pulses full and equal in all extremities Extremities/Back : Normal muscle strength and tone., No clubbing, cyanosis or edema Neurological: No gross motor or sensory deficits noted, affect appropriate, oriented  to time, person and place.   __    Medications added today by the physician:  lisinopril 10 mg tablet, 1 po q am, 90      IMPRESSIONS:   1. 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   2. Nonobstructive coronary artery disease by catheterization in 2014.   3. Normal myocardial perfusion by nuclear stress testing  in September of 2017.  4. Status post Medtronic biventricular ICD.  5. History of neurocardiogenic syncope, never proven to be ventricular arrhythmias.  6. Hypertension, controlled.   7. Type 2 diabetes mellitus, on insulin, he states well controlled.  8. Chronic kidney disease stage III.    RECOMMENDATIONS:  1. Stop valsartan.  2. Continue lisinopril at 10 mg daily.  3. Continue the rest of the cardiac medication  regimen.  4. Referral to our Research Team to discuss enrollment in the GALACTIC-HF Clinical Trial.  5. Return to see me in three months. If he has any cardiac concerns in the interim he will   call the office and I am happy to see him sooner.      Elliot Dally. Eliseo Gum, MD, [facility]    CRB/tutbh    cc: Zorita Pang MD    EL   ____________________________   Christianne Dolin  GN:FAOZHYQ Education ICD-10: I42.9 MedlinePlus Connect results for ICD-10 I42.9  Consider Galactic 2 months  Return Visit 15 MIN 3 months

## 2021-01-21 NOTE — Progress Notes (Signed)
[facility] OFFICE  [address] [place], [place] [address]     [name] [name]    Date of Visit:  11/30/2016  Date of Birth: 1946/08/03  Age: 75 yrs.   Medical Record Number: [id number]  __  CURRENT DIAGNOSES     1. Hypertensive Heart Disease, Benign  w/o CHF, 402.10  2. Cardiomyopathy non-ischemic unspecified, I42.9  3. Chest pain, unspecified, R07.9  4. Syncope and collapse, R55  5. Icd - [name] Jude, P[employer]etter, [employer], V45.02  6. Device check automatic implantable cardiac  defibrillator, Z45.02  7. MI, Acute/Non-Q Subsequent, 410.72  8. Cardiomyopathy, NOS, 425.4  9. Arrhythmia-Ventricular Tachycardia, 427.1  10. 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 DM Type II, Uncomplicated,Unspecified, 250.00  12. Hypercholesterolemia, 272.0  __  ALLERGIES     No Known Drug Allergies  __  MEDICATIONS     1. Exforge 10-320 mg Tablet, 1 po qd  2. Lantus  100 unit/mL solution, Take as Directed  3. Janumet 50-1,000 mg tablet, 1 po q am  4. Niaspan Extended-Release 500 mg tablet extended release 24 hr, 1 po qd  5. aspirin 81 mg tablet,delayed release (DR/EC), 1 po daily  6. Coreg 25 mg tablet,  1 po bid  7. spironolactone 25 mg tablet, 1 po bid  8. Ambien 10 mg tablet, at hs  9. atorvastatin 40 mg tablet, 1 po qd  10. hydrocodone 5 mg-chlorpheniramine 4 mg/5 mL oral solution, take as directed up to every 6 hours as needed for  cough  __  CHIEF COMPLAINT/REASON FOR VISIT  Followup of Cardiomyopathy non-ischemic unspecified  __   HISTORY OF PRESENT ILLNESS  Mr. Trella returns to follow up his nonischemic cardiomyopathy. He has been doing well since I last saw him with no issues symptomatically. He was hospitalized with pneumonia  in December and has a persistent dry cough, but no signs or symptoms of congestive heart failure. Since I last saw him he was seen by Dr. Trish Mage for an issue with his defibrillator lead as his RV lead was not capturing. He underwent a  revision, which included  upgrade to a biventricular device given dyssynchrony. He has not noticed any symptomatic change as he was essentially asymptomatic to begin with, with NYHA functional class I symptoms. He has not had his ejection fraction reassessed since implantation.  He currently denies any chest discomfort, orthopnea, paroxysmal nocturnal dyspnea, or peripheral edema.   __  PAST HISTORY      Past Medical Illnesses: HTN, DM, Hyperlipidemia, Depression, GERD, Pneumonia December 2017;  Past Cardiac  Illnesses: Nonischemic cardiomyopathy, CAD, NSTEMI; Infectious Diseases: No previous history of significant  infectious diseases.; Surgical Procedures: Hernia repair, hammertoe surgery, BIV ICD Medtronic Upgrade 11/10/16;  Trauma History: No previous history of significant trauma.; Cardiology Procedures-Invasive: Cardiac Cath  (left) June 2011, Cardiac Cath March 2014, Defibrillator implant March 2014; Cardiology Procedures-Noninvasive: Echocardiogram May 2017, MPI (Nuclear)  Study September 2017; Cardiac Cath Results: 6/11- LM normal, LAD 70-80% distal lesion at apex, 20% lesion mid CFX;  Left Ventricular Ejection Fraction: LVEF of 28% documented via nuclear study on 06/29/2016  ___   FAMILY HISTORY  Mother -- Coronary Artery Disease     __  CARDIAC RISK FACTORS     Tobacco Abuse: has never used tobacco;  Family History of Heart Disease: positive; Hyperlipidemia: positive;  Hypertension: positive;  Diabetes Mellitus: positive;  Prior History of Heart Disease: positive; Obesity: positive;  Sedentary Life Style:positive; JYN:[id number];  Menopausal :not applicable  __  SOCIAL HISTORY    Alcohol Use : drinks occasionally; Smoking: Does not smoke; Never smoker ([id number]); Diet : Regular diet; Exercise: No regular exercise; 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  Height: 72"  BMI:  27   Pulse: 72/min. Apical Regular   Respirations:  18/min. regular and relaxed      Constitutional: Cooperative, alert and oriented,well developed,  well nourished, in no acute distress. Skin: Scaly rash BLE Head : Normocephalic, normal hair pattern, no masses or tenderness Eyes: EOMS Intact, PERRL, conjunctivae and  lids normal. Funduscopic exam and visual fields not performed. ENT: Ears, Nose and throat reveal no gross  abnormalities. No pallor or cyanosis. Dentition good. Neck: No palpable masses or adenopathy, no thyromegaly,  no JVD, carotid pulses are full and equal bilaterally without bruits. Chest: Normal symmetry, no tenderness to palpation, normal respiratory excursion,  no intercostal retraction, no use of accessory muscles, normal diaphragmatic excursion, clear to auscultation and percussion. Cardiac: Regular rhythm,  S1 normal, S2 normal, No S3 or S4, Apical impulse not displaced, no murmurs, gallops or rubs detected. Abdomen: Abdomen soft, bowel sounds normoactive,  no masses, no hepatosplenomegaly, non-tender, no bruits Peripheral Pulses: The femoral, popliteal, dorsalis  pedis, and posterior tibial pulses are full and equal bilaterally with no bruits auscultated. Extremities/Back: No deformities, clubbing, cyanosis, erythema  or edema observed. There are no spinal abnormalities noted. Normal muscle strength and tone. Neurological:  No gross motor or sensory deficits noted, affect appropriate, oriented to time, person and place.   __    Medications added today by the physician:   hydrocodone 5 mg-chlorpheniramine 4 mg/5 mL oral solution, take as directed up to every 6 hours as needed for cough, 1      IMPRESSIONS:   1.  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  2. New York Heart Association functional class I symptoms with no chronic systolic congestive   heart failure symptoms really ever by history. He was never hospitalized  for  congestive heart   failure.   3. 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  4. Normal myocardial perfusion by nuclear stress testing in September of 2017.   5. Status post St. Jude primary prevention  defibrillator which was just upgraded to a Medtronic   biventricular ICD device in January of 2018.  6. History of syncope in the past that has never been proven to represent ventricular arrhythmias   by device interrogations. 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    7. Hypertension, well controlled.   8. Type 2 diabetes mellitus, on insulin.     RECOMMENDATIONS:  1. I will repeat an echocardiogram in approximately  two-three weeks to reassess left ventricular   function.   2. Regarding the chronic cough, I refilled his cough syrup and told him to confer with Dr. Altamease Oiler   for further refills and further evaluation of his persistent dry cough.  3.  He can return and see me for general cardiovascular followup in approximately six months.   If he has any cardiac concerns in the interim he will call the office. He will continue routine   device check surveillance through our Electrophysiology  Team.    Elliot Dally. Eliseo Gum, MD, [facility]  CRB/tutbh     cc: HOLGER NOELLE MD     AP  ____________________________  TODAYS ORDERS  2D, color flow, doppler 2 weeks  Return Visit 15 MIN 6 months

## 2021-01-27 ENCOUNTER — Other Ambulatory Visit (INDEPENDENT_AMBULATORY_CARE_PROVIDER_SITE_OTHER): Payer: Self-pay | Admitting: Family Medicine

## 2021-01-29 ENCOUNTER — Ambulatory Visit (INDEPENDENT_AMBULATORY_CARE_PROVIDER_SITE_OTHER): Payer: Commercial Managed Care - POS | Admitting: Nurse Practitioner

## 2021-01-29 ENCOUNTER — Encounter (INDEPENDENT_AMBULATORY_CARE_PROVIDER_SITE_OTHER): Payer: Self-pay | Admitting: Nurse Practitioner

## 2021-01-29 DIAGNOSIS — I5022 Chronic systolic (congestive) heart failure: Secondary | ICD-10-CM

## 2021-01-29 DIAGNOSIS — I428 Other cardiomyopathies: Secondary | ICD-10-CM

## 2021-01-29 MED ORDER — EMPAGLIFLOZIN 10 MG PO TABS
10.0000 mg | ORAL_TABLET | Freq: Every day | ORAL | 3 refills | Status: DC
Start: 2021-01-29 — End: 2021-04-24

## 2021-01-29 MED ORDER — TORSEMIDE 20 MG PO TABS
20.0000 mg | ORAL_TABLET | Freq: Every morning | ORAL | 3 refills | Status: DC
Start: 2021-01-29 — End: 2021-02-19

## 2021-01-29 NOTE — Progress Notes (Signed)
[facility] OFFICE PROGRESS NOTE    [facility] OFFICE -CARDIOLOGY  [address]  [place] [address]  Dept: [phone]  Dept Fax: [phone]       Patient Name: [name] [name]    Date of Visit:  January 29, 2021  Date of Birth: Aug 18, 1946  AGE: 75 y.o.  Medical Record #: [id number]      CHIEF COMPLAINT:    Chief Complaint   Patient presents with   . Non-ischemic cardiomyopathy   . Follow-up       HISTORY OF PRESENT ILLNESS:    I had the pleasure of seeing [name] today in follow up in the Advanced Heart Failure Clinic at [facility]. He is a pleasant 75 y.o. 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  He also has a Medtronic BiV ICD.  Currently he is on Carvedilol, Entresto, and spironolactone for GDMT. He takes torsemide 40mg  once daily. Denies of any profound shortness of breath at rest, dizziness, orthopnea, PND, or edema.      Since last visit, he followed up with his primary care physician who recommended starting Jardiance.  He was not advised to change his current diabetic regimen.  He is feeling well from a cardiac perspective and denies any chest pain/discomfort, profound shortness of breath, orthopnea, PND, or edema.  His weight is actually down from the last visit and states that he has been intentional with his diet. States he currently takes Torsemide 40mg  once daily.      Most recent metabolic panel from 01/13/2021 showed BUN 20, creatinine 1.2, potassium 4.8.      PAST MEDICAL HISTORY: He has a past medical history of Acute CHF, Acute systolic (congestive) heart failure (08/2017), Anxiety, CAD (coronary artery disease), Cardiomyopathy, CHF (congestive heart failure) (08/12/2014, 2013), Chronic obstructive pulmonary disease, Coronary artery disease (2011), Depression, echocardiogram (02/2016, 11/2016, 09/2017, 12/20/2017), GERD  (gastroesophageal reflux disease), Heart attack (2011), Hyperlipemia, Hypertension, ICD (implantable 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, Ischemic cardiomyopathy, Nonischemic cardiomyopathy, NSTEMI (non-ST elevated myocardial infarction), 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 He has a past surgical history that includes duodenal ulcer (1973); orthopedic surgery (AGE 59); EGD (N/A, 08/15/2014); Circumcision (AGE 8); Fracture surgery (AGE 29); Hernia repair (AGE 8); Colonoscopy (2009); Tonsillectomy and adenoidectomy (1954); Cardiac defibrillator placement (12/2012); Cardiac pacemaker placement (2014); EGD (1975); EGD, COLONOSCOPY (N/A, 04/04/2018); ECHOCARDIOGRAM, TRANSTHORACIC (02/2016 11/2016 09/2017 12/2017); MPI nuclear study (06/2016); Correction hammer toe; BIV (11/10/2016); ECHOCARDIOGRAM, TRANSTHORACIC (02/2016,11/2016,12/20/2017); Cardiac catheterization (03/2010); and Cardiac catheterization (12/2012).    ALLERGIES: No Known Allergies    MEDICATIONS:   Current Outpatient Medications   Medication Sig   . ALPRAZolam (XANAX) 0.25 MG tablet Take 0.25 mg by mouth nightly as needed   . aspirin EC 81 MG EC tablet Take 81 mg by mouth daily   . atorvastatin (LIPITOR) 40 MG tablet Take 1 tablet (40 mg total) by mouth nightly   . carvedilol (COREG) 25 MG tablet Take 1 tablet (25 mg total) by mouth 2 (two) times daily with meals   . docusate sodium (COLACE) 100 MG capsule Take by mouth   . Entresto 97-103 MG Tab per tablet TAKE 1 TABLET BY MOUTH TWICE DAILY   . fluticasone furoate-vilanterol (Breo Ellipta) 100-25 MCG/INH Aerosol Pwdr, Breath Activated Inhale 1 puff into  the lungs daily   . glucose blood (ONE TOUCH ULTRA TEST) test strip 1 each by Other route 2 (two) times daily Use as instructed   . insulin glargine (LANTUS SOLOSTAR) 100 UNIT/ML injection pen ADMINISTER 50  UNITS UNDER THE SKIN AT BEDTIME PER PATIENT.   Marland Kitchen niacin (NIASPAN) 500 MG CR tablet Take 1 tablet (500 mg total) by mouth 2 (two) times daily niacin ER 500 mg tablet,extended release 24 hr   . PARoxetine (Paxil) 20 MG tablet Take 1 tablet (20 mg total) by mouth every morning   . SITagliptin-metFORMIN (Janumet) 50-1000 MG tablet Take 1 tablet by mouth 2 (two) times daily with meals   . spironolactone (ALDACTONE) 25 MG tablet Take 0.5 tablets (12.5 mg total) by mouth daily   . tamsulosin (FLOMAX) 0.4 MG Cap TAKE 1 CAPSULE(0.4 MG) BY MOUTH DAILY AFTER DINNER   . empagliflozin (JARDIANCE) 10 MG tablet Take 1 tablet (10 mg total) by mouth daily   . torsemide (Demadex) 20 MG tablet Take 1 tablet (20 mg total) by mouth every morning        FAMILY HISTORY: family history includes Breast cancer in his mother; Coronary artery disease in his mother; Diabetes in his brother and mother; Heart attack in his father; Heart attack (age of onset: 63) in his mother; Hypertension in his father and mother.    SOCIAL HISTORY: He reports that he has never smoked. He has never used smokeless tobacco. He reports that he does not drink alcohol and does not use drugs.      PHYSICAL EXAMINATION    Visit Vitals  BP 120/80 (BP Site: Left arm, Patient Position: Sitting)   Pulse 72   Ht 1.829 m (6')   Wt 97 kg (213 lb 12.8 oz)   BMI 29.00 kg/m       Last 3 Weights:   Weight Monitoring 11/22/2020 01/13/2021 01/29/2021   Height 182.9 cm 172.7 cm 182.9 cm   Height Method - - -   Weight 96.163 kg 102.059 kg 96.979 kg   Weight Method - - -   BMI (calculated) 28.8 kg/m2 34.3 kg/m2 29.1 kg/m2         General Appearance:  Cooperative and in no acute distress.    Skin: Warm and dry to touch  Head: Normocephalic  Eyes: Conjunctivae and lids unremarkable.    ENT: wearing mask   Neck: No JVD  Chest: Clear to auscultation bilaterally with good air movement and respiratory effort   Cardiovascular: Regular rhythm, S1 normal, S2 normal, No S3 or S4  Abdomen: Soft,  nontender with normoactive bowel sounds.   Extremities: Warm without edema, +2 b/l radial pulses  Neuro: Alert and oriented x3. No gross motor or sensory deficits noted, affect appropriate.        IMPRESSION / RECOMMENDATIONS:   1. [name] [name] is a 75 y.o. male with chronic systolic heart failure due to nonischemic cardiomyopathy, ACC/AHA stage C, NYHA class 3. The last LVEF was 15% demonstrated by echocardiogram in May 2021 and 20-25% in July 2021.     2. Goal directed therapy includes:   Beta blocker: Carvedilol 25 mg twice daily   ACE/ARB/ARNI: Entresto 97/103 mg twice daily   MRA: Spironolactone 12.5 mg daily   SGLT2-imhibitors: Start Jardiance 10mg  once daily. Reviewed potential side effects and lowering of blood glucose. Advised to monitor sugars closely and follow up with PCP    3. Current diuretic regimen: Reduce Torsemide to 20mg  daily. Instructed to take additional  1 tab in the AM for weight gain by 2-3lbs in one day or 5-6lbs in one week, worsening shortness of breath, or lower extremity edema   4. Repeat BMP in 1-2 weeks to reassess renal function and electrolytes   5. Follow up with APP in 4 weeks, or sooner if needed                                                    Orders Placed This Encounter   Procedures   . Basic Metabolic Panel   . Advanced Heart Failure APP Visit ([facility])       Orders Placed This Encounter   Medications   . empagliflozin (JARDIANCE) 10 MG tablet     Sig: Take 1 tablet (10 mg total) by mouth daily     Dispense:  90 tablet     Refill:  3   . torsemide (Demadex) 20 MG tablet     Sig: Take 1 tablet (20 mg total) by mouth every morning     Dispense:  90 tablet     Refill:  3         SIGNED:    Conception Oms, NP          This note was generated by the Dragon speech recognition and may contain errors or omissions not intended by the user. Grammatical errors, random word insertions, deletions, pronoun errors, and incomplete sentences are occasional consequences of this technology due  to software limitations. Not all errors are caught or corrected. If there are questions or concerns about the content of this note or information contained within the body of this dictation, they should be addressed directly with the author for clarification.

## 2021-02-03 ENCOUNTER — Other Ambulatory Visit (INDEPENDENT_AMBULATORY_CARE_PROVIDER_SITE_OTHER): Payer: Commercial Managed Care - POS | Admitting: Cardiovascular Disease

## 2021-02-03 LAB — REMOTE CARDIAC DEVICE MONITORING

## 2021-02-06 ENCOUNTER — Other Ambulatory Visit (INDEPENDENT_AMBULATORY_CARE_PROVIDER_SITE_OTHER): Payer: Self-pay | Admitting: Family Medicine

## 2021-02-06 DIAGNOSIS — E119 Type 2 diabetes mellitus without complications: Secondary | ICD-10-CM

## 2021-02-06 DIAGNOSIS — Z794 Long term (current) use of insulin: Secondary | ICD-10-CM

## 2021-02-07 ENCOUNTER — Telehealth (INDEPENDENT_AMBULATORY_CARE_PROVIDER_SITE_OTHER): Payer: Self-pay

## 2021-02-07 ENCOUNTER — Encounter (INDEPENDENT_AMBULATORY_CARE_PROVIDER_SITE_OTHER): Payer: Self-pay | Admitting: Family Medicine

## 2021-02-07 NOTE — Telephone Encounter (Signed)
Latest LAB results faxed to [facility] on 02/07/2021

## 2021-02-17 ENCOUNTER — Emergency Department: Payer: Commercial Managed Care - POS

## 2021-02-17 ENCOUNTER — Inpatient Hospital Stay
Admission: EM | Admit: 2021-02-17 | Discharge: 2021-02-19 | DRG: 291 | Disposition: A | Discharge status: Home / Self Care | Payer: Commercial Managed Care - POS | Attending: Internal Medicine | Admitting: Internal Medicine

## 2021-02-17 ENCOUNTER — Encounter (INDEPENDENT_AMBULATORY_CARE_PROVIDER_SITE_OTHER): Payer: Self-pay | Admitting: Family Medicine

## 2021-02-17 DIAGNOSIS — N183 Chronic kidney disease, stage 3 unspecified: Secondary | ICD-10-CM | POA: Diagnosis present

## 2021-02-17 DIAGNOSIS — I5023 Acute on chronic systolic (congestive) heart failure: Secondary | ICD-10-CM | POA: Diagnosis present

## 2021-02-17 DIAGNOSIS — K922 Gastrointestinal hemorrhage, unspecified: Secondary | ICD-10-CM | POA: Diagnosis present

## 2021-02-17 DIAGNOSIS — Z79899 Other long term (current) drug therapy: Secondary | ICD-10-CM

## 2021-02-17 DIAGNOSIS — F419 Anxiety disorder, unspecified: Secondary | ICD-10-CM

## 2021-02-17 DIAGNOSIS — D72829 Elevated white blood cell count, unspecified: Secondary | ICD-10-CM

## 2021-02-17 DIAGNOSIS — K219 Gastro-esophageal reflux disease without esophagitis: Secondary | ICD-10-CM

## 2021-02-17 DIAGNOSIS — K644 Residual hemorrhoidal skin tags: Secondary | ICD-10-CM

## 2021-02-17 DIAGNOSIS — I428 Other cardiomyopathies: Secondary | ICD-10-CM | POA: Diagnosis present

## 2021-02-17 DIAGNOSIS — I25700 Atherosclerosis of coronary artery bypass graft(s), unspecified, with unstable angina pectoris: Secondary | ICD-10-CM

## 2021-02-17 DIAGNOSIS — J9801 Acute bronchospasm: Secondary | ICD-10-CM

## 2021-02-17 DIAGNOSIS — I2510 Atherosclerotic heart disease of native coronary artery without angina pectoris: Secondary | ICD-10-CM | POA: Diagnosis present

## 2021-02-17 DIAGNOSIS — I509 Heart failure, unspecified: Secondary | ICD-10-CM

## 2021-02-17 DIAGNOSIS — R7989 Other specified abnormal findings of blood chemistry: Secondary | ICD-10-CM

## 2021-02-17 DIAGNOSIS — Z95810 Presence of automatic (implantable) cardiac defibrillator: Secondary | ICD-10-CM

## 2021-02-17 DIAGNOSIS — K625 Hemorrhage of anus and rectum: Secondary | ICD-10-CM

## 2021-02-17 DIAGNOSIS — Z20822 Contact with and (suspected) exposure to covid-19: Secondary | ICD-10-CM | POA: Diagnosis present

## 2021-02-17 DIAGNOSIS — I252 Old myocardial infarction: Secondary | ICD-10-CM

## 2021-02-17 DIAGNOSIS — E7800 Pure hypercholesterolemia, unspecified: Secondary | ICD-10-CM

## 2021-02-17 DIAGNOSIS — N179 Acute kidney failure, unspecified: Secondary | ICD-10-CM | POA: Diagnosis present

## 2021-02-17 DIAGNOSIS — Z955 Presence of coronary angioplasty implant and graft: Secondary | ICD-10-CM

## 2021-02-17 DIAGNOSIS — R1030 Lower abdominal pain, unspecified: Secondary | ICD-10-CM

## 2021-02-17 DIAGNOSIS — Z8719 Personal history of other diseases of the digestive system: Secondary | ICD-10-CM

## 2021-02-17 DIAGNOSIS — R079 Chest pain, unspecified: Secondary | ICD-10-CM

## 2021-02-17 DIAGNOSIS — I130 Hypertensive heart and chronic kidney disease with heart failure and stage 1 through stage 4 chronic kidney disease, or unspecified chronic kidney disease: Principal | ICD-10-CM | POA: Diagnosis present

## 2021-02-17 DIAGNOSIS — J449 Chronic obstructive pulmonary disease, unspecified: Secondary | ICD-10-CM

## 2021-02-17 DIAGNOSIS — E785 Hyperlipidemia, unspecified: Secondary | ICD-10-CM | POA: Diagnosis present

## 2021-02-17 DIAGNOSIS — R0602 Shortness of breath: Secondary | ICD-10-CM

## 2021-02-17 DIAGNOSIS — F32A Depression, unspecified: Secondary | ICD-10-CM

## 2021-02-17 DIAGNOSIS — I10 Essential (primary) hypertension: Secondary | ICD-10-CM

## 2021-02-17 DIAGNOSIS — R059 Cough, unspecified: Secondary | ICD-10-CM

## 2021-02-17 DIAGNOSIS — I255 Ischemic cardiomyopathy: Secondary | ICD-10-CM | POA: Diagnosis present

## 2021-02-17 DIAGNOSIS — E118 Type 2 diabetes mellitus with unspecified complications: Secondary | ICD-10-CM

## 2021-02-17 DIAGNOSIS — I429 Cardiomyopathy, unspecified: Secondary | ICD-10-CM

## 2021-02-17 DIAGNOSIS — Z794 Long term (current) use of insulin: Secondary | ICD-10-CM

## 2021-02-17 DIAGNOSIS — N189 Chronic kidney disease, unspecified: Secondary | ICD-10-CM

## 2021-02-17 LAB — URINALYSIS REFLEX TO MICROSCOPIC EXAM - REFLEX TO CULTURE
Bilirubin, UA: NEGATIVE
Glucose, UA: >=500 — AB
Ketones UA: NEGATIVE
Leukocyte Esterase, UA: NEGATIVE
Nitrite, UA: NEGATIVE
Protein, UR: NEGATIVE
Specific Gravity UA: 1.01 (ref 1.001–1.035)
Urine pH: 6 (ref 5.0–8.0)
Urobilinogen, UA: NORMAL mg/dL (ref 0.2–2.0)

## 2021-02-17 LAB — URINALYSIS, REFLEX TO MICROSCOPIC EXAM IF INDICATED
Bilirubin, UA: NEGATIVE
Glucose, UA: >=500 — AB
Ketones UA: NEGATIVE
Leukocyte Esterase, UA: NEGATIVE
Nitrite, UA: NEGATIVE
Protein, UR: NEGATIVE
Specific Gravity UA: 1.014 (ref 1.001–1.035)
Urine pH: 6 (ref 5.0–8.0)
Urobilinogen, UA: NORMAL mg/dL (ref 0.2–2.0)

## 2021-02-17 LAB — CBC AND DIFFERENTIAL
Absolute NRBC: 0 10*3/uL (ref 0.00–0.00)
Basophils Absolute Automated: 0.02 10*3/uL (ref 0.00–0.08)
Basophils Automated: 0.1 %
Eosinophils Absolute Automated: 0.08 10*3/uL (ref 0.00–0.44)
Eosinophils Automated: 0.5 %
Hematocrit: 38.1 % (ref 37.6–49.6)
Hgb: 12.3 g/dL — ABNORMAL LOW (ref 12.5–17.1)
Immature Granulocytes Absolute: 0.04 10*3/uL (ref 0.00–0.07)
Immature Granulocytes: 0.3 %
Lymphocytes Absolute Automated: 9.83 10*3/uL — ABNORMAL HIGH (ref 0.42–3.22)
Lymphocytes Automated: 64.2 %
MCH: 31.6 pg (ref 25.1–33.5)
MCHC: 32.3 g/dL (ref 31.5–35.8)
MCV: 97.9 fL — ABNORMAL HIGH (ref 78.0–96.0)
MPV: 12.3 fL (ref 8.9–12.5)
Monocytes Absolute Automated: 1.88 10*3/uL — ABNORMAL HIGH (ref 0.21–0.85)
Monocytes: 12.3 %
Neutrophils Absolute: 3.46 10*3/uL (ref 1.10–6.33)
Neutrophils: 22.6 %
Nucleated RBC: 0 /100 WBC (ref 0.0–0.0)
Platelets: 154 10*3/uL (ref 142–346)
RBC: 3.89 10*6/uL — ABNORMAL LOW (ref 4.20–5.90)
RDW: 14 % (ref 11–15)
WBC: 15.31 10*3/uL — ABNORMAL HIGH (ref 3.10–9.50)

## 2021-02-17 LAB — CELL MORPHOLOGY
Cell Morphology: NORMAL
Platelet Estimate: NORMAL

## 2021-02-17 LAB — COMPREHENSIVE METABOLIC PANEL
ALT: 27 U/L (ref 0–55)
AST (SGOT): 22 U/L (ref 5–34)
Albumin/Globulin Ratio: 1.1 (ref 0.9–2.2)
Albumin: 3.3 g/dL — ABNORMAL LOW (ref 3.5–5.0)
Alkaline Phosphatase: 64 U/L (ref 38–106)
Anion Gap: 14 (ref 5.0–15.0)
BUN: 32.8 mg/dL — ABNORMAL HIGH (ref 9.0–28.0)
Bilirubin, Total: 0.8 mg/dL (ref 0.2–1.2)
CO2: 20 mEq/L — ABNORMAL LOW (ref 22–29)
Calcium: 9.1 mg/dL (ref 7.9–10.2)
Chloride: 107 mEq/L (ref 100–111)
Creatinine: 1.7 mg/dL — ABNORMAL HIGH (ref 0.7–1.3)
Globulin: 3.1 g/dL (ref 2.0–3.6)
Glucose: 129 mg/dL — ABNORMAL HIGH (ref 70–100)
Potassium: 4.6 mEq/L (ref 3.5–5.1)
Protein, Total: 6.4 g/dL (ref 6.0–8.3)
Sodium: 141 mEq/L (ref 136–145)

## 2021-02-17 LAB — TYPE AND SCREEN
AB Screen Gel: NEGATIVE
ABO Rh: O POS

## 2021-02-17 LAB — ECG 12-LEAD
Atrial Rate: 76 {beats}/min
Q-T Interval: 442 ms
R Axis: 1 degrees
Ventricular Rate: 76 {beats}/min

## 2021-02-17 LAB — HEMOGLOBIN AND HEMATOCRIT, BLOOD
Hematocrit: 39.1 % (ref 37.6–49.6)
Hematocrit: 38.7 % (ref 37.6–49.6)
Hgb: 12.6 g/dL (ref 12.5–17.1)
Hgb: 12.6 g/dL (ref 12.5–17.1)

## 2021-02-17 LAB — GLUCOSE WHOLE BLOOD - POCT
Whole Blood Glucose POCT: 119 mg/dL — ABNORMAL HIGH (ref 70–100)
Whole Blood Glucose POCT: 226 mg/dL — ABNORMAL HIGH (ref 70–100)
Whole Blood Glucose POCT: 139 mg/dL — ABNORMAL HIGH (ref 70–100)
Whole Blood Glucose POCT: 173 mg/dL — ABNORMAL HIGH (ref 70–100)
Whole Blood Glucose POCT: 227 mg/dL — ABNORMAL HIGH (ref 70–100)

## 2021-02-17 LAB — PT AND APTT
PT INR: 1.2 — ABNORMAL HIGH (ref 0.9–1.1)
PT: 13.6 s — ABNORMAL HIGH (ref 10.1–12.9)
PTT: 34 s (ref 27–39)

## 2021-02-17 LAB — COVID-19 (SARS-COV-2) & INFLUENZA  A/B, NAA (ROCHE LIAT)
Influenza A: NOT DETECTED
Influenza B: NOT DETECTED
SARS CoV 2 Overall Result: NOT DETECTED

## 2021-02-17 LAB — TROPONIN I
Troponin I: 0.05 ng/mL (ref 0.00–0.05)
Troponin I: 0.03 ng/mL (ref 0.00–0.05)
Troponin I: 0.03 ng/mL (ref 0.00–0.05)

## 2021-02-17 LAB — ABO/RH: ABO Rh: O POS

## 2021-02-17 LAB — IHS 2ND ABORH REQUEST

## 2021-02-17 LAB — GFR: EGFR: 47.9

## 2021-02-17 LAB — B-TYPE NATRIURETIC PEPTIDE: B-Natriuretic Peptide: 1354.5 pg/mL — ABNORMAL HIGH (ref 0.0–100.0)

## 2021-02-17 LAB — LACTIC ACID, PLASMA: Lactic Acid: 1.1 mmol/L (ref 0.2–2.0)

## 2021-02-17 LAB — CK: Creatine Kinase (CK): 152 U/L (ref 47–267)

## 2021-02-17 LAB — LIPASE: Lipase: 36 U/L (ref 8–78)

## 2021-02-17 LAB — MAGNESIUM: Magnesium: 2.2 mg/dL (ref 1.6–2.6)

## 2021-02-17 MED ORDER — ACETAMINOPHEN 325 MG PO TABS
650.0000 mg | ORAL_TABLET | ORAL | Status: DC | PRN
Start: 2021-02-17 — End: 2021-02-19
  Administered 2021-02-18 – 2021-02-19 (×2): 650 mg via ORAL
  Filled 2021-02-17 (×2): qty 2

## 2021-02-17 MED ORDER — DOCUSATE SODIUM 100 MG PO CAPS
100.0000 mg | ORAL_CAPSULE | Freq: Two times a day (BID) | ORAL | Status: DC
Start: 2021-02-17 — End: 2021-02-19
  Administered 2021-02-17 – 2021-02-18 (×3): 100 mg via ORAL
  Filled 2021-02-17 (×4): qty 1

## 2021-02-17 MED ORDER — SITAGLIPTIN PHOSPHATE 50 MG PO TABS
50.0000 mg | ORAL_TABLET | Freq: Every day | ORAL | Status: DC
Start: 2021-02-17 — End: 2021-02-19
  Administered 2021-02-18 – 2021-02-19 (×2): 50 mg via ORAL
  Filled 2021-02-17 (×3): qty 1

## 2021-02-17 MED ORDER — FUROSEMIDE 10 MG/ML IJ SOLN
80.0000 mg | Freq: Two times a day (BID) | INTRAMUSCULAR | Status: DC
Start: 2021-02-17 — End: 2021-02-18
  Administered 2021-02-17 (×2): 80 mg via INTRAVENOUS
  Filled 2021-02-17 (×3): qty 8

## 2021-02-17 MED ORDER — DEXTROSE 10 % IV BOLUS
25.0000 g | INTRAVENOUS | Status: DC | PRN
Start: 2021-02-17 — End: 2021-02-19

## 2021-02-17 MED ORDER — FUROSEMIDE 10 MG/ML IJ SOLN
40.0000 mg | Freq: Once | INTRAMUSCULAR | Status: AC
Start: 2021-02-17 — End: 2021-02-17
  Administered 2021-02-17: 40 mg via INTRAVENOUS
  Filled 2021-02-17: qty 4

## 2021-02-17 MED ORDER — CARVEDILOL 12.5 MG PO TABS
25.0000 mg | ORAL_TABLET | Freq: Two times a day (BID) | ORAL | Status: DC
Start: 2021-02-17 — End: 2021-02-19
  Administered 2021-02-17 – 2021-02-19 (×3): 25 mg via ORAL
  Filled 2021-02-17 (×4): qty 2

## 2021-02-17 MED ORDER — ALPRAZOLAM 0.25 MG PO TABS
0.2500 mg | ORAL_TABLET | Freq: Every evening | ORAL | Status: DC | PRN
Start: 2021-02-17 — End: 2021-02-19

## 2021-02-17 MED ORDER — ONDANSETRON HCL 4 MG/2ML IJ SOLN
4.0000 mg | Freq: Once | INTRAMUSCULAR | Status: AC
Start: 2021-02-17 — End: 2021-02-17
  Administered 2021-02-17: 4 mg via INTRAVENOUS
  Filled 2021-02-17: qty 2

## 2021-02-17 MED ORDER — ALBUTEROL-IPRATROPIUM 2.5-0.5 (3) MG/3ML IN SOLN
3.0000 mL | Freq: Four times a day (QID) | RESPIRATORY_TRACT | Status: DC | PRN
Start: 2021-02-17 — End: 2021-02-19

## 2021-02-17 MED ORDER — SPIRONOLACTONE 25 MG PO TABS
12.5000 mg | ORAL_TABLET | Freq: Every day | ORAL | Status: DC
Start: 2021-02-17 — End: 2021-02-19
  Administered 2021-02-17 – 2021-02-19 (×3): 12.5 mg via ORAL
  Filled 2021-02-17 (×4): qty 1

## 2021-02-17 MED ORDER — FLUTICASONE FUROATE-VILANTEROL 100-25 MCG/INH IN AEPB
1.0000 | INHALATION_SPRAY | Freq: Every morning | RESPIRATORY_TRACT | Status: DC
Start: 2021-02-17 — End: 2021-02-19
  Administered 2021-02-18 – 2021-02-19 (×2): 1 via RESPIRATORY_TRACT
  Filled 2021-02-17: qty 14

## 2021-02-17 MED ORDER — DEXTROSE 50 % IV SOLN
25.0000 g | INTRAVENOUS | Status: DC | PRN
Start: 2021-02-17 — End: 2021-02-19

## 2021-02-17 MED ORDER — NIACIN ER (ANTIHYPERLIPIDEMIC) 500 MG PO TBCR
500.0000 mg | EXTENDED_RELEASE_TABLET | Freq: Two times a day (BID) | ORAL | Status: DC
Start: 2021-02-17 — End: 2021-02-19
  Administered 2021-02-17 – 2021-02-19 (×6): 500 mg via ORAL
  Filled 2021-02-17 (×8): qty 1

## 2021-02-17 MED ORDER — ALBUTEROL SULFATE (2.5 MG/3ML) 0.083% IN NEBU
2.5000 mg | INHALATION_SOLUTION | Freq: Once | RESPIRATORY_TRACT | Status: AC
Start: 2021-02-17 — End: 2021-02-17
  Administered 2021-02-17: 2.5 mg via RESPIRATORY_TRACT
  Filled 2021-02-17: qty 3

## 2021-02-17 MED ORDER — INSULIN LISPRO 100 UNIT/ML SC SOLN
1.0000 [IU] | Freq: Three times a day (TID) | SUBCUTANEOUS | Status: DC
Start: 2021-02-17 — End: 2021-02-19
  Administered 2021-02-17: 09:00:00 2 [IU] via SUBCUTANEOUS
  Administered 2021-02-18: 13:00:00 3 [IU] via SUBCUTANEOUS
  Administered 2021-02-18: 10:00:00 1 [IU] via SUBCUTANEOUS
  Administered 2021-02-19: 3 [IU] via SUBCUTANEOUS
  Administered 2021-02-19: 12:00:00 1 [IU] via SUBCUTANEOUS
  Filled 2021-02-17: qty 3
  Filled 2021-02-17: qty 9
  Filled 2021-02-17: qty 3
  Filled 2021-02-17: qty 6
  Filled 2021-02-17: qty 9

## 2021-02-17 MED ORDER — NITROGLYCERIN 2 % TD OINT
0.5000 [in_us] | TOPICAL_OINTMENT | Freq: Once | TRANSDERMAL | Status: AC
Start: 2021-02-17 — End: 2021-02-17
  Administered 2021-02-17: 0.5 [in_us] via TOPICAL
  Filled 2021-02-17: qty 1

## 2021-02-17 MED ORDER — FUROSEMIDE 10 MG/ML IJ SOLN
20.0000 mg | Freq: Two times a day (BID) | INTRAMUSCULAR | Status: DC
Start: 2021-02-17 — End: 2021-02-17

## 2021-02-17 MED ORDER — GLUCAGON 1 MG IJ SOLR (WRAP)
1.0000 mg | INTRAMUSCULAR | Status: DC | PRN
Start: 2021-02-17 — End: 2021-02-19

## 2021-02-17 MED ORDER — ATORVASTATIN CALCIUM 20 MG PO TABS
40.0000 mg | ORAL_TABLET | Freq: Every evening | ORAL | Status: DC
Start: 2021-02-17 — End: 2021-02-19
  Administered 2021-02-17 – 2021-02-18 (×2): 40 mg via ORAL
  Filled 2021-02-17 (×2): qty 2

## 2021-02-17 MED ORDER — PAROXETINE HCL 20 MG PO TABS
20.0000 mg | ORAL_TABLET | Freq: Every day | ORAL | Status: DC
Start: 2021-02-17 — End: 2021-02-19
  Administered 2021-02-17 – 2021-02-19 (×3): 20 mg via ORAL
  Filled 2021-02-17 (×4): qty 1

## 2021-02-17 MED ORDER — MORPHINE SULFATE 4 MG/ML IJ/IV SOLN (WRAP)
4.0000 mg | Freq: Once | Status: AC
Start: 2021-02-17 — End: 2021-02-17
  Administered 2021-02-17: 4 mg via INTRAVENOUS
  Filled 2021-02-17: qty 1

## 2021-02-17 MED ORDER — IOHEXOL 350 MG/ML IV SOLN
100.0000 mL | Freq: Once | INTRAVENOUS | Status: AC | PRN
Start: 2021-02-17 — End: 2021-02-17
  Administered 2021-02-17: 100 mL via INTRAVENOUS

## 2021-02-17 MED ORDER — ONDANSETRON HCL 4 MG/2ML IJ SOLN
4.0000 mg | Freq: Four times a day (QID) | INTRAMUSCULAR | Status: DC | PRN
Start: 2021-02-17 — End: 2021-02-19

## 2021-02-17 MED ORDER — IOHEXOL 12 MG/ML PO SOLN
1000.0000 mL | Freq: Once | ORAL | Status: AC | PRN
Start: 2021-02-17 — End: 2021-02-17
  Administered 2021-02-17: 1000 mL via ORAL

## 2021-02-17 MED ORDER — DEXTROSE 5% IV BOLUS
250.0000 mL | INTRAVENOUS | Status: DC | PRN
Start: 2021-02-17 — End: 2021-02-19

## 2021-02-17 MED ORDER — EMPAGLIFLOZIN 10 MG PO TABS
10.0000 mg | ORAL_TABLET | Freq: Every day | ORAL | Status: DC
Start: 2021-02-17 — End: 2021-02-19
  Administered 2021-02-18 – 2021-02-19 (×2): 10 mg via ORAL
  Filled 2021-02-17 (×4): qty 1

## 2021-02-17 MED ORDER — SACUBITRIL-VALSARTAN 97-103 MG PO TABS
1.0000 | ORAL_TABLET | Freq: Two times a day (BID) | ORAL | Status: DC
Start: 2021-02-17 — End: 2021-02-19
  Administered 2021-02-17 – 2021-02-19 (×3): 1 via ORAL
  Filled 2021-02-17 (×8): qty 1

## 2021-02-17 MED ORDER — INSULIN LISPRO 100 UNIT/ML SC SOLN
1.0000 [IU] | Freq: Every evening | SUBCUTANEOUS | Status: DC
Start: 2021-02-17 — End: 2021-02-19
  Administered 2021-02-17: 21:00:00 1 [IU] via SUBCUTANEOUS
  Filled 2021-02-17: qty 3

## 2021-02-17 NOTE — Plan of Care (Signed)
Problem: Altered GI Function  Goal: Fluid and electrolyte balance are achieved/maintained  Outcome: Progressing    Pt had smear of bright red bleeding on the underpants. No active bleeding noted.  Flowsheets (Taken 02/17/2021 0808)  Fluid and electrolyte balance are achieved/maintained:   Assess for confusion/personality changes   Provide adequate hydration   Monitor intake and output every shift   Monitor/assess lab values and report abnormal values   Monitor daily weight   Observe for cardiac arrhythmias   Observe for seizure activity and initiate seizure precautions if indicated   Assess and reassess fluid and electrolyte status   Monitor for muscle weakness  Goal: Elimination patterns are normal or improving  Outcome: Progressing  Flowsheets (Taken 02/17/2021 0808)  Elimination patterns are normal or improving:   Report abnormal assessment to physician   Anticipate/assist with toileting needs   Assess for normal bowel sounds   Monitor for abdominal distension   Monitor for abdominal discomfort   Assess for signs and symptoms of bleeding.  Report signs of bleeding to physician   Administer treatments as ordered   Consult/collaborate with Clinical Nutritionist   Assess for flatus   Assess for and discuss C. diff screening with LIP   Collaborate with LIP for containment device   Reinforce education on foods that improve and complicate bowel movements and how activity and medications can affect bowel movements   Administer medications to improve bowel evacuation as prescribed   Encourage /perform oral hygiene as appropriate  Goal: No bleeding  Outcome: Progressing  Flowsheets (Taken 02/17/2021 0808)  No bleeding:   Assess for bruising/petechia   Monitor and assess vitals and hemodynamic parameters   Monitor/assess lab values and report abnormal values     Problem: Day of Admission - Heart Failure  Goal: Heart Failure Admission  Outcome: Progressing  Flowsheets (Taken 02/17/2021 0808)  Heart Failure Admission:   Fluid  restriction   Strict Intake/Output   Vital signs and telemetry per policy   Standing Weight on admission, if unable to stand zero the bed and use the bed scale   Assess for swelling/edema and document   Initiate education with patient and caregiver using CHF Warning Zones and Educational Videos ([facility] or [employer])   Oxygen as needed

## 2021-02-17 NOTE — ED Notes (Signed)
Pt noted to Desat to 86%, RN in room and noted pt to be asleep, denies hx of sleep apnea, pt provided NC 2L. MD made aware.

## 2021-02-17 NOTE — Plan of Care (Signed)
Problem: Safety  Goal: Patient will be free from injury during hospitalization  Outcome: Progressing  Flowsheets (Taken 02/17/2021 1311)  Patient will be free from injury during hospitalization:   Assess patient's risk for falls and implement fall prevention plan of care per policy   Provide and maintain safe environment   Hourly rounding     Problem: Moderate/High Fall Risk Score >5  Goal: Patient will remain free of falls  Outcome: Progressing  Flowsheets (Taken 02/17/2021 0845)  High (Greater than 13):   HIGH-Consider use of low bed   HIGH-Initiate use of floor mats as appropriate   MOD-Use of assistive devices -Bedside Commode if appropriate   MOD-Remain with patient during toileting   HIGH-Apply yellow "Fall Risk" arm band   MOD-Place Fall Risk level on whiteboard in room   MOD-Perform dangle, stand, walk (DSW) prior to mobilization     Problem: Altered GI Function  Goal: Fluid and electrolyte balance are achieved/maintained  Outcome: Progressing  Flowsheets (Taken 02/17/2021 1311)  Fluid and electrolyte balance are achieved/maintained:   Monitor intake and output every shift   Provide adequate hydration   Monitor daily weight  Goal: No bleeding  Outcome: Progressing  Flowsheets (Taken 02/17/2021 1311)  No bleeding:   Monitor and assess vitals and hemodynamic parameters   Monitor/assess lab values and report abnormal values   Assess for bruising/petechia     Problem: Day of Admission - Heart Failure  Goal: Heart Failure Admission  Outcome: Progressing  Flowsheets (Taken 02/17/2021 1311)  Heart Failure Admission:   Vital signs and telemetry per policy   Oxygen as needed   Strict Intake/Output   Fluid restriction   Assess for swelling/edema and document   Standing Weight on admission, if unable to stand zero the bed and use the bed scale

## 2021-02-17 NOTE — Consults (Signed)
GASTROENTEROLOGY INITIAL CONSULTATION NOTE    Date Time: 02/17/21 8:57 AM  Patient Name: [name] [name]  Requesting Physician: Shanon Brow, *       Reason for Consultation:   Rectal bleed     Assessment and Recommendations:   [name] [name] is a 75 y.o. male w/ PMHx of esophagitis, DM , CHF, CAD s/p stent 2017 on baby ASA, COPD, NSTEMI who presents to the hospital on 02/17/2021 with left lower abdominal pain and rectal bleeding x1 days.    5/2 CT finding -Bilateral airspace opacities suggestive of bronchiolitis. No acute inflammatory process involving the abdomen or pelvis.  Mesenteric, portahepatis, and inguinal lymphadenopathy of uncertain etiology. Correlate with clinical history.  Multiple renal cysts and subcentimeter hypodensities that are too  small to characterize. These can be better evaluated with MRI on a  nonemergent basis as clinically indicated. Overall these are similar to  exam from 08/22/2017      # abdominal  pain -mid lower -  #Rectal bleed-Suspect lower GI source. DDx for etiology include diverticular bleeding, angiodysplastic lesions, neoplasia, hemorrhoidal bleeding, IBD and ischemic colitis.   #history of Esophagitis - EGD 2015-Dr.Khan  #external hemorrhoid-non bleeding-  noted on exam  #CHF/CAD  ,s/p AICD: [facility]   #HTN  #history of inguinal hernia    Recommendations:   -Measures for acute GI bleeding: n.p.o., IV fluids, 2 large-bore IV access, active type and cross,  transfuse as needed for hemoglobin > 7 - 8.   -Please give FFP to reverse coagulopathy with a goal INR less than 1.5 in the setting of GI bleeding.  --CBC Q 8 if active bleed , daily if no active bleed  --trend HH , transfuse if Hgb < 8   ---Clear liquid diet today-   --cardiology consult   --HH stable and no sign of active bleeding since admission -rectal exam negative for blood-   -- we will cont to monitor pt  and re evaluate tomorrow for indication of colonoscopy inpatient vs outpatient .        Gi will cont to F/U          History:   [name] [name] is a 75 y.o. male w/ PMHx of esophagitis, DM , CHF, CAD s/p stent 2017 on baby ASA, COPD, NSTEMI who presents to the hospital on 02/17/2021 with left lower abdominal pain and rectal bleeding x1 days.    Colonoscopy 2019 with polypectomy-     Past Medical History:     Past Medical History:   Diagnosis Date   . Acute CHF     NOV  & DEC 2018, - DIALYSIS CATH PLACED Aug 24 2017 TO REMOVE FLUID    . Acute systolic (congestive) heart failure 08/2017   . Anxiety    . CAD (coronary artery disease)    . Cardiomyopathy     nonischemic   . CHF (congestive heart failure) 08/12/2014, 2013   . Chronic obstructive pulmonary disease     POSSIBLE PER PT HE USES  SYNBICORT BID ABD  BREO INHALER PRN   . Coronary artery disease 2011    CORONARY STENT PLACEMENT FOLLOWED BY  HEART   . Depression    . echocardiogram 02/2016, 11/2016, 09/2017, 12/20/2017   . GERD (gastroesophageal reflux disease)    . Heart attack 2011    and 2014   . Hyperlipemia    . Hypertension    . ICD (implantable 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     PLACED 2014  [facility]  LAST INTERROGATION April 01 2018 REPORT REQUESTED   . Ischemic cardiomyopathy     EF 15% ON ECHO 09-20-2017 DR GARG IN EPIC   . Nonischemic cardiomyopathy    . NSTEMI (non-ST elevated myocardial infarction)    . 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   . Pacemaker 2014    [employer] ICD/PACEMAKER COMBO LAST INTERROGATION  12-12 2018   . Pneumonia 09/2016   . Primary cardiomyopathy     Ischemic cardiomyopathy EF 15% ON ECHO 09-20-2017 DR GARG IN EPIC   . Sepsis 08/2017   . Syncope and collapse     PRIOR TO ICD/PACEMAKER PLACED 2014   . Type 2 diabetes mellitus, controlled DX 1998    BS  AVG 100  A1C 7.4 Dec 27 2017   . Wheeze     PT SEE HIS PMD April 01 2018 RE THIS-TOLD TO SEE PMD BY [facility] JUNE 12 WHEN THEY SAW HIM FOR A CL       Past Surgical History:     Past Surgical History:   Procedure Laterality Date   . BIV  11/10/2016    ICD METRONIC  UPGRADE    .  CARDIAC CATHETERIZATION  03/2010    CORONARY STENT PLACEMENT X 2 PER PT; LM normal, LAD 70-80% distal lesion at apex, 20% lesion mid CFX   . CARDIAC CATHETERIZATION  12/2012   . CARDIAC DEFIBRILLATOR PLACEMENT  12/2012    [facility]   . CARDIAC PACEMAKER PLACEMENT  2014    [employer] ICD/PACEMAKER PLACED [facility]   . CIRCUMCISION  AGE 74   . COLONOSCOPY  2009   . CORRECTION HAMMER TOE     . duodenal ulcer  1973    STRESS RELATED IN SCHOOL   . ECHOCARDIOGRAM, TRANSTHORACIC  02/2016 11/2016 09/2017 12/2017   . ECHOCARDIOGRAM, TRANSTHORACIC  02/2016,11/2016,12/20/2017   . EGD N/A 08/15/2014    Procedure: EGD;  Surgeon: Colon Branch, MD;  Location: [facility];  Service: Gastroenterology;  Laterality: N/A;  egd w/ bx   . EGD  1975   . EGD, COLONOSCOPY N/A 04/04/2018    Procedure: EGD with bxs, COLONOSCOPY with polypectomy and clipping;  Surgeon: Doyne Keel, MD;  Location: [facility];  Service: Gastroenterology;  Laterality: N/A;   . FRACTURE SURGERY  AGE 77     RT  ANKLE- CAST APPLIED   . HERNIA REPAIR  AGE 74    LEFT INGUINAL HERNIA   . MPI nuclear study  06/2016   . orthopedic surgery  AGE 27    RT foot corrective - HAMMERTOE   . TONSILLECTOMY AND ADENOIDECTOMY  1954       Family History:     Family History   Problem Relation Age of Onset   . Breast cancer Mother    . Heart attack Mother 31   . Hypertension Mother    . Diabetes Mother    . Coronary artery disease Mother    . Diabetes Brother    . Heart attack Father    . Hypertension Father        Social History:     Social History     Socioeconomic History   . Marital status: Married     Spouse name: Lajoyce Corners   . Number of children: 0   . Years of education: Not on file   . Highest education level: Not on file   Occupational History   . 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 Smoking status: Never Smoker   . Smokeless tobacco: Never Used   Vaping Use   . Vaping Use: Never used   Substance and Sexual Activity   . Alcohol use: No      Alcohol/week: 0.0 standard drinks     Comment: occasionally   . Drug use: No   . Sexual activity: Yes     Partners: Female   Other Topics Concern   . Not on file   Social History Narrative   . Not on file     Social Determinants of Health     Financial Resource Strain: Not on file   Food Insecurity: Not on file   Transportation Needs: Not on file   Physical Activity: Not on file   Stress: Not on file   Social Connections: Not on file   Intimate Partner Violence: Not on file   Housing Stability: Not on file       Allergies:   No Known Allergies    Medications:     Current Facility-Administered Medications   Medication Dose Route Frequency   . atorvastatin  40 mg Oral QHS   . carvedilol  25 mg Oral Q12H SCH   . docusate sodium  100 mg Oral BID   . empagliflozin  10 mg Oral Daily   . fluticasone furoate-vilanterol  1 puff Inhalation QAM   . furosemide  20 mg Intravenous Q12H   . insulin lispro  1-3 Units Subcutaneous QHS   . insulin lispro  1-5 Units Subcutaneous TID AC   . niacin  500 mg Oral BID   . nitroglycerin  0.5 inch Topical Once   . PARoxetine  20 mg Oral Daily   . sacubitril-valsartan  1 tablet Oral BID   . SITagliptin  50 mg Oral Daily   . spironolactone  12.5 mg Oral Daily       has a current medication list which includes the following prescription(s): aspirin ec - Take 81 mg by mouth daily, atorvastatin - Take 1 tablet (40 mg total) by mouth nightly, carvedilol - Take 1 tablet (25 mg total) by mouth 2 (two) times daily with meals, docusate sodium - Take by mouth daily, empagliflozin - Take 1 tablet (10 mg total) by mouth daily, entresto - TAKE 1 TABLET BY MOUTH TWICE DAILY, glucose blood - 1 each by Other route 2 (two) times daily Use as instructed, insulin glargine - ADMINISTER 50 UNITS UNDER THE SKIN AT BEDTIME, spironolactone - Take 0.5 tablets (12.5 mg total) by mouth daily, tamsulosin - TAKE 1 CAPSULE(0.4 MG) BY MOUTH DAILY AFTER DINNER, torsemide - Take 1 tablet (20 mg total) by mouth every  morning, alprazolam - Take 0.25 mg by mouth nightly as needed, breo ellipta - Inhale 1 puff into the lungs daily (Patient not taking: Reported on 02/17/2021), niacin - TAKE 1 TABLET BY MOUTH TWICE DAILY, paroxetine - Take 1 tablet (20 mg total) by mouth every morning, and janumet - Take 1 tablet by mouth 2 (two) times daily with meals, and the following Facility-Administered Medications: acetaminophen, albuterol-ipratropium, alprazolam, atorvastatin, carvedilol, Nursing communication: Adult Hypoglycemia Treatment Algorithm **AND** glucagon (rdna) **AND** dextrose **AND** dextrose **AND** dextrose, docusate sodium, empagliflozin, fluticasone furoate-vilanterol, furosemide, insulin lispro **AND** NSG Communication: Glucose POCT order, insulin lispro **AND** NSG Communication: Glucose POCT order, niacin, nitroglycerin, ondansetron, paroxetine, sacubitril-valsartan, sitagliptin, spironolactone.     Review of Systems:   Constitutional: Denies fever, chills or weight loss.  Eyes: Denies changes in vision.  ENMT: Denies changes in hearing, nasal discharge, oral lesions or sore throat.  Respiratory: Denies wheezing or cough.  Cardiovascular: Denies chest pain or palpitations.  Gastrointestinal: See HPI.  Genitourinary: Denies gross hematuria or dark urine.  Musculoskeletal: Denies back pain or joint pain.  Neurologic: Denies slurred speech, hemiplegia or focal deficit.  Psychiatric: Denies depression or anxiety.   Hematologic: Denies easy bruising.  Integumentary:  Denies skin rashes or Jaundice.    Pertinent positives noted in HPI.    Physical Exam:     Vitals:    02/17/21 0815   BP: 113/79   Pulse: 68   Resp: 21   Temp: (!) 96.9 F (36.1 C)   SpO2: 96%       General appearance - Well developed and well nourished, no acute distress.  Eyes - Sclera anicteric, no ptosis.  ENMT - Mucous membranes moist, nose and ears appear normal.  Oropharynx clear.  Respiratory - bilateral wheezing , posterior crackles   Cardiovascular -  Regular rate and rhythm, no LE edema.  Gastrointestinal - Soft, TTP lower mid , non-distended, no masses, normal bowel sounds.   Musculoskeletal - Normal range of motion of arms and legs.  Neurologic - Alert and oriented to person, place and time.  No gross movement disorders noted.  Psychiatric: Appropriate affect.  Skin: Normal color and turgor, no rashes, no suspicious skin lesions noted.      Labs Reviewed:     Recent Labs   Lab 02/17/21  0326   WBC 15.31*   Hgb 12.3*   Hematocrit 38.1   Platelets 154       Recent Labs   Lab 02/17/21  0326   Sodium 141   Potassium 4.6   Chloride 107   CO2 20*   BUN 32.8*   Creatinine 1.7*   Calcium 9.1   Albumin 3.3*   Protein, Total 6.4   Bilirubin, Total 0.8   Alkaline Phosphatase 64   ALT 27   AST (SGOT) 22   Glucose 129*   Lipase 36               Rads:   GI Radiological Procedures since admission  reviewed.   Radiology Results (24 Hour)     Procedure Component Value Units Date/Time    CT Abd/Pelvis with IV and PO Contrast [[id number]] Collected: 02/17/21 0439    Order Status: Completed Updated: 02/17/21 0451    Narrative:      HISTORY: Lower abdominal plain with rectal bleeding.    COMPARISON: CT abdomen pelvis from 08/22/2017    TECHNIQUE: CT abdomen and pelvis WITH contrast. 100 mL IV Omnipaque 350  was administered. Oral contrast was administered.  The following dose  reduction techniques were utilized: Automated exposure control and/or  adjustment of the mA and/or kV according to patient size, and the use of  iterative reconstruction technique.    FINDINGS:     LOWER THORAX:  There are scattered nodular and tree-in-bud and  groundglass opacities in the lung bases. There is linear atelectasis  versus scarring in the lower lobes. There are partially imaged  biventricular pacing wires. There is cardiomegaly with left atrial  enlargement. There is a calcified granuloma in the right lower lobe.    LIVER/BILIARY TREE:  No mass or intrahepatic biliary dilatation. No  gallbladder  distension or calcified gallstones.  SPLEEN: No splenomegaly.  PANCREAS:  No pancreatic mass or duct dilatation.    KIDNEY/URETERS: No hydronephrosis, stones or solid mass lesions. There  are multiple cysts in both kidneys. The largest in the left kidney  measures 8.8 x 7 cm in the inferior pole. The largest in the right  kidney measures 6.2 x 6 cm in the upper pole. Additional subcentimeter  hypodensities are too small to characterize.  ADRENALS:  No adrenal mass.  PELVIC ORGANS/BLADDER: No pelvic masses.    PERITONEUM/RETROPERITONEUM: No free air or fluid.  LYMPH NODES: There is porta hepatis, mesenteric, and left greater the  right iliac chain lymphadenopathy.  VESSELS: No aortic aneurysm. There is mild multifocal atherosclerotic  calcification of the aortoiliac vessels.    GI TRACT: No bowel wall thickening or dilation. The appendix is normal.    BONES AND SOFT TISSUES:  There is a small fat-containing right inguinal  hernia. There are is moderate severe multisegmental degenerative  spondylosis most pronounced in the lower lumbar spine. There is no acute  osseous abnormality.      Impression:          1.  Bilateral airspace opacities suggestive of bronchiolitis.  2.  No acute inflammatory process involving the abdomen or pelvis.  3.  Mesenteric, porta hepatis, and inguinal lymphadenopathy of uncertain  etiology. Correlate with clinical history.  4.  Multiple renal cysts and subcentimeter hypodensities that are too  small to characterize. These can be better evaluated with MRI on a  nonemergent basis as clinically indicated. Overall these are similar to  exam from 08/22/2017    Theador Hawthorne, MD   02/17/2021 4:49 AM    XR Chest  AP Portable [[id number]] Collected: 02/17/21 0356    Order Status: Completed Updated: 02/17/21 0400    Narrative:      HISTORY: Cough    COMPARISON: 09/16/2017    FINDINGS: AP view of the chest was performed. Left cardiac pacing device  noted. The cardiomediastinal silhouette appears normal.  There is no  vascular congestion. There is minimal atelectasis at the lung bases. No  pleural effusion or pneumothorax.      Impression:           No acute process.    Demetrios Isaacs, MD   02/17/2021 3:58 AM              Signed by: Smith Robert, FNP

## 2021-02-17 NOTE — Progress Notes (Signed)
0645  Pt admitted at this time from ED, alert and oriented x 4, denies chest pain, SOB on exertion and at rest 98% oxygenation on 2L of oxygen per NC, HOB elevated, deep breathing encouraged. Vital signs stable, pt noted with smear of bright red blood in the under pant, perineal care done. No active bleeding noted. Pt complained of mid abdominal pain, ice pack provided and noted with partial relief of pain, fall precaution initiated. Bed alarm on, floors mat on.    FOUR EYES SKIN ASSESSMENT NOTE    [name] [name]  14-Jan-1946  [id number]    Braden Scale Score: 19    POC Initiated for Risk for Altered Skin: No    Mepilex Dressing Applied to sacrum/heel if any PI risk factors present: No    If Wound / Pressure Injury Present:    Wound / PI Documented on Patient Avatar No    Wound / PI assessment documented in Flowsheet: No    Admitting Physician notified: No    Wound Consult ordered: No    Clotilde Dieter, RN  Feb 17, 2021  8:17 AM     Brown discoloration to back, no open area, no pressure ulcer noted.     Second RN Name: Santina Evans RN

## 2021-02-17 NOTE — ED Notes (Signed)
[facility]  ED NURSING NOTE FOR THE RECEIVING INPATIENT NURSE   ED NURSE Etta Quill [phone]   ED CHARGE RN Misty Stanley   ADMISSION INFORMATION   [name] [name] is a 75 y.o. male admitted with an ED diagnosis of:    1. LGI bleed    2. Cough    3. 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 SOB (shortness of breath)    5. Bronchospasm    6. Type 2 diabetes mellitus with complication, with long-term current use of insulin    7. Coronary artery disease involving coronary bypass graft of native heart with unstable angina pectoris    8. Acute on chronic systolic CHF (congestive heart failure)    9. Cardiomyopathy, unspecified type         Isolation: None   Allergies: Patient has no known allergies.   Holding Orders confirmed? Yes   Belongings Documented? Yes   Home medications sent to pharmacy confirmed? N/A   NURSING CARE   Patient Comes From:   Mental Status: Home Independent  alert and oriented   ADL: Independent with all ADLs   Ambulation: no difficulty   Pertinent Information  and Safety Concerns: please call RN     CT / NIH   CT Head ordered on this patient?  No   NIH/Dysphagia assessment done prior to admission? No   VITAL SIGNS (at the time of this note)      Vitals:    02/17/21 0522   BP: 115/71   Pulse: 74   Resp: 22   Temp:    SpO2: 96%

## 2021-02-17 NOTE — Progress Notes (Signed)
[name] HOSPITALIST  Progress Note  Patient Info:   Date/Time: 02/17/2021 / 3:50 PM   Admit Date:02/17/2021  Patient Name:[name] [name]   NWG:[id number]   PCP: Zorita Pang, MD  Attending Physician:Charvis Lightner, Remonia Richter, MD     Assessment and Plan:     75 year old male with chronic systolic CHF, nonischemic cardiomyopathy s/p AICD, chronic kidney disease stage III, coronary artery disease, COPD, type 2 diabetes, hypertension, hyperlipidemia presented with rectal bleeding and admitted with acute on chronic systolic CHF and evaluation of rectal bleeding.    Rectal bleeding and abdominal pain  Suspect diverticular versus hemorrhoidal bleeding.  CT of the abdomen no acute inflammatory process.  Mesenteric, inguinal lymphadenopathy of uncertain etiology.  Multiple renal cysts and subcentimeter hypodensities too small to characterize.    Hemoglobin stable.  No further rectal bleeding.  Appreciated GI input.  Started on clear liquid diet.  We will trend serial hemoglobin and transfuse as needed for hemoglobin less than 8.  Based on the trend of the hemoglobin and the clinical course GI will make a decision whether he needs inpatient or outpatient colonoscopy.  Discussed at length with patient he is agreeable.    Acute on chronic systolic CHF  Nonischemic cardiomyopathy with EF of 25% s/p AICD  BNP greater than 1300.   Appreciated cardiology input.  Continue with IV Lasix 80 mg twice daily.  Continue daily weights, intake and output charting.  Plan is to switch to p.o. torsemide on discharge.    Acute on chronic kidney disease stage III  Monitor renal function closely with diuresis.    History of nonocclusive coronary artery disease  Continue on Coreg, Lipitor.  Aspirin on hold until GI work-up is complete.  Stable otherwise.    Known COPD with no acute signs of exacerbation.  Continue on Breo and duo nebs as needed    Type 2 diabetes continue on Jardiance, Januvia and sliding scale insulin    Hypertension continue  on Coreg    Hyperlipidemia continue Lipitor    Anxiety and depression continue on Paxil    Plan discussed in detail with patient and his wife at bedside and answered all the questions.    DVT Prohylaxis: SCDs  Central Line/Foley Catheter/PICC line status: None  Code Status: Full Code  Disposition:home  Type of Admission:Inpatient  Expected Date of Discharge: 48 hours  Milestones required for discharge: As stated above  Hospital Problems:   Principal Problem:    Acute on chronic systolic CHF (congestive heart failure)  Active Problems:    Type 2 diabetes mellitus with complication, with long-term current use of insulin    CAD (coronary artery disease)    Non-ischemic cardiomyopathy    Essential hypertension    AICD (automatic cardioverter/defibrillator) present    Acute renal failure superimposed on stage 3 chronic kidney disease    GERD (gastroesophageal reflux disease)    Leukocytosis    Lower GI bleed    Anxiety and depression    COPD (chronic obstructive pulmonary disease)    HLD (hyperlipidemia)    Subjective:   02/17/21   Seen and examined this morning  Breathing is better than yesterday.  No further rectal bleeding.  Denied any abdominal pain.  Denied any chest pain or shortness of breath today  Chief Complaint:  Rectal Bleeding    ROS  Objective:     Vitals:    02/17/21 0815 02/17/21 0847 02/17/21 1201 02/17/21 1234   BP: 113/79 122/80 114/80    Pulse: 68  71 71 66   Resp: 21  21    Temp: (!) 96.9 F (36.1 C)  97.8 F (36.6 C)    TempSrc: Temporal  Temporal    SpO2: 96%  96%    Weight:       Height:         Physical Exam:   Physical Exam   Comfortable in bed in no distress  CVS S1-S2 regular  Respiratory bilateral Rales  Abdomen soft nontender  Extremities no edema  Neuro awake alert oriented x3  Results of Labs/imaging   Labs and radiology reports have been reviewed.    Hospitalist   Signed by:   Haze Boyden, MD  02/17/2021 3:50 PM    *This note was generated by the Epic EMR system/ Dragon speech  recognition and may contain inherent errors or omissions not intended by the user. Grammatical errors, random word insertions, deletions, pronoun errors and incomplete sentences are occasional consequences of this technology due to software limitations. Not all errors are caught or corrected. If there are questions or concerns about the content of this note or information contained within the body of this dictation they should be addressed directly with the author for clarification

## 2021-02-17 NOTE — H&P (Signed)
[name] HOSPITAL[facility] H&P Note    Patient Info:   Date/Time: 02/17/2021 / 4:35 AM   Admit Date:02/17/2021  Patient Name:[name] [name]   BMW:[id number]   PCP: Zorita Pang, MD  Attending Physician: Dorthula Nettles, MD  Assessment/Plan:   1.  CHF exacerbation with nonischemic cardiomyopathy and s/p AICD: Continue diuresis with Lasix IV, strict I's and O's, monitor on telemetry, follow cardiac enzymes, continue Coreg, Entresto, spironolactone, consult cardiology ([facility])  2.  Lower GI bleed: Keep n.p.o., monitor H&H every 6 hours, hold aspirin, transfuse packed red blood cells if needed, consult GI in a.m.  3.  Acute on chronic kidney disease, stage III: Prerenal most likely, continue diuresis with Lasix IV, repeat labs in a.m.  4.  CAD: Continue Lipitor, Coreg, hold aspirin, monitor on telemetry  5.  COPD: Continue Breo Ellipta and DuoNeb treatment as needed  6.  Diabetes mellitus, type II: Continue Jardiance, Januvia and insulin/with Humalog  7.  Hypertension: Continue Coreg  8.  Hyperlipidemia: Continue Lipitor and niacin  9.  Leukocytosis: Reactive most likely, repeat labs in a.m.  10.  GERD: Stable  11.  Depression and anxiety: Continue Paxil    DVT Prohylaxis:SEDs   Code Status: Prior  Disposition:home  Type of Admission: Inpatient  Estimated Length of Stay (including stay in the ER receiving treatment): More than 2 days  Milestones required for discharge: CHF exacerbation and lower GI bleed resolved  Clinical Information and History:   Chief Complaint:  Chief Complaint   Patient presents with   . Rectal Bleeding     Past Medical History:  Past Medical History:   Diagnosis Date   . Acute CHF     NOV  & DEC 2018, - DIALYSIS CATH PLACED Aug 24 2017 TO REMOVE FLUID    . Acute systolic (congestive) heart failure 08/2017   . Anxiety    . CAD (coronary artery disease)    . Cardiomyopathy     nonischemic   . CHF (congestive heart failure) 08/12/2014, 2013   . Chronic obstructive pulmonary disease      POSSIBLE PER PT HE USES  SYNBICORT BID ABD  BREO INHALER PRN   . Coronary artery disease 2011    CORONARY STENT PLACEMENT FOLLOWED BY [facility]   . Depression    . echocardiogram 02/2016, 11/2016, 09/2017, 12/20/2017   . GERD (gastroesophageal reflux disease)    . Heart attack 2011    and 2014   . Hyperlipemia    . Hypertension    . ICD (implantable 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     PLACED 2014  [facility]  LAST INTERROGATION April 01 2018 REPORT REQUESTED   . Ischemic cardiomyopathy     EF 15% ON ECHO 09-20-2017 DR GARG IN EPIC   . Nonischemic cardiomyopathy    . NSTEMI (non-ST elevated myocardial infarction)    . 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   . Pacemaker 2014    MEDTRONIC ICD/PACEMAKER COMBO LAST INTERROGATION  12-12 2018   . Pneumonia 09/2016   . Primary cardiomyopathy     Ischemic cardiomyopathy EF 15% ON ECHO 09-20-2017 DR GARG IN EPIC   . Sepsis 08/2017   . Syncope and collapse     PRIOR TO ICD/PACEMAKER PLACED 2014   . Type 2 diabetes mellitus, controlled DX 1998    BS  AVG 100  A1C 7.4 Dec 27 2017   . Wheeze     PT SEE HIS PMD April 01 2018 RE THIS-TOLD TO SEE PMD  BY [facility] JUNE 12 WHEN THEY SAW HIM FOR A CL     Past Surgical History:  Past Surgical History:   Procedure Laterality Date   . BIV  11/10/2016    ICD METRONIC  UPGRADE    . CARDIAC CATHETERIZATION  03/2010    CORONARY STENT PLACEMENT X 2 PER PT; LM normal, LAD 70-80% distal lesion at apex, 20% lesion mid CFX   . CARDIAC CATHETERIZATION  12/2012   . CARDIAC DEFIBRILLATOR PLACEMENT  12/2012    [facility]   . CARDIAC PACEMAKER PLACEMENT  2014    MEDTRONIC ICD/PACEMAKER PLACED [facility]   . CIRCUMCISION  AGE 89   . COLONOSCOPY  2009   . CORRECTION HAMMER TOE     . duodenal ulcer  1973    STRESS RELATED IN SCHOOL   . ECHOCARDIOGRAM, TRANSTHORACIC  02/2016 11/2016 09/2017 12/2017   . ECHOCARDIOGRAM, TRANSTHORACIC  02/2016,11/2016,12/20/2017   . EGD N/A 08/15/2014    Procedure: EGD;  Surgeon: Colon Branch, MD;  Location: [facility];  Service:  Gastroenterology;  Laterality: N/A;  egd w/ bx   . EGD  1975   . EGD, COLONOSCOPY N/A 04/04/2018    Procedure: EGD with bxs, COLONOSCOPY with polypectomy and clipping;  Surgeon: Doyne Keel, MD;  Location: [facility];  Service: Gastroenterology;  Laterality: N/A;   . FRACTURE SURGERY  AGE 69     RT  ANKLE- CAST APPLIED   . HERNIA REPAIR  AGE 89    LEFT INGUINAL HERNIA   . MPI nuclear study  06/2016   . orthopedic surgery  AGE 30    RT foot corrective - HAMMERTOE   . TONSILLECTOMY AND ADENOIDECTOMY  1954     Family History:  Family History   Problem Relation Age of Onset   . Breast cancer Mother    . Heart attack Mother 69   . Hypertension Mother    . Diabetes Mother    . Coronary artery disease Mother    . Diabetes Brother    . Heart attack Father    . Hypertension Father      Social History:  Social History     Substance and Sexual Activity   Alcohol Use No   . Alcohol/week: 0.0 standard drinks    Comment: occasionally     Social History     Substance and Sexual Activity   Drug Use No     Social History     Tobacco Use   Smoking Status Never Smoker   Smokeless Tobacco Never Used     Social History     Socioeconomic History   . Marital status: Married     Spouse name: Lajoyce Corners   . Number of children: 0   . Years of education: None   . Highest education level: None   Occupational History   . 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 Smoking status: Never Smoker   . Smokeless tobacco: Never Used   Vaping Use   . Vaping Use: Never used   Substance and Sexual Activity   . Alcohol use: No     Alcohol/week: 0.0 standard drinks     Comment: occasionally   . Drug use: No   . Sexual activity: Yes     Partners: Female   Other Topics Concern   . None   Social History Narrative   . None     Social Determinants of  Health     Financial Resource Strain: Not on file   Food Insecurity: Not on file   Transportation Needs: Not on file   Physical Activity: Not on file   Stress: Not on file   Social  Connections: Not on file   Intimate Partner Violence: Not on file   Housing Stability: Not on file     Allergies:No Known Allergies  Medications:(Not in a hospital admission)    Clinical Presentation: HPI:   Nikolus Marczak is a 75 y.o. male who has history of   Past Surgical History:   Procedure Laterality Date   . BIV  11/10/2016    ICD METRONIC  UPGRADE    . CARDIAC CATHETERIZATION  03/2010    CORONARY STENT PLACEMENT X 2 PER PT; LM normal, LAD 70-80% distal lesion at apex, 20% lesion mid CFX   . CARDIAC CATHETERIZATION  12/2012   . CARDIAC DEFIBRILLATOR PLACEMENT  12/2012    [facility]   . CARDIAC PACEMAKER PLACEMENT  2014    MEDTRONIC ICD/PACEMAKER PLACED [facility]   . CIRCUMCISION  AGE 22   . COLONOSCOPY  2009   . CORRECTION HAMMER TOE     . duodenal ulcer  1973    STRESS RELATED IN SCHOOL   . ECHOCARDIOGRAM, TRANSTHORACIC  02/2016 11/2016 09/2017 12/2017   . ECHOCARDIOGRAM, TRANSTHORACIC  02/2016,11/2016,12/20/2017   . EGD N/A 08/15/2014    Procedure: EGD;  Surgeon: Colon Branch, MD;  Location: [facility];  Service: Gastroenterology;  Laterality: N/A;  egd w/ bx   . EGD  1975   . EGD, COLONOSCOPY N/A 04/04/2018    Procedure: EGD with bxs, COLONOSCOPY with polypectomy and clipping;  Surgeon: Doyne Keel, MD;  Location: [facility];  Service: Gastroenterology;  Laterality: N/A;   . FRACTURE SURGERY  AGE 47     RT  ANKLE- CAST APPLIED   . HERNIA REPAIR  AGE 22    LEFT INGUINAL HERNIA   . MPI nuclear study  06/2016   . orthopedic surgery  AGE 57    RT foot corrective - HAMMERTOE   . TONSILLECTOMY AND ADENOIDECTOMY  1954      Past Medical History:   Diagnosis Date   . Acute CHF     NOV  & DEC 2018, - DIALYSIS CATH PLACED Aug 24 2017 TO REMOVE FLUID    . Acute systolic (congestive) heart failure 08/2017   . Anxiety    . CAD (coronary artery disease)    . Cardiomyopathy     nonischemic   . CHF (congestive heart failure) 08/12/2014, 2013   . Chronic obstructive pulmonary disease     POSSIBLE  PER PT HE USES  SYNBICORT BID ABD  BREO INHALER PRN   . Coronary artery disease 2011    CORONARY STENT PLACEMENT FOLLOWED BY [facility]   . Depression    . echocardiogram 02/2016, 11/2016, 09/2017, 12/20/2017   . GERD (gastroesophageal reflux disease)    . Heart attack 2011    and 2014   . Hyperlipemia    . Hypertension    . ICD (implantable 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     PLACED 2014  [facility]  LAST INTERROGATION April 01 2018 REPORT REQUESTED   . Ischemic cardiomyopathy     EF 15% ON ECHO 09-20-2017 DR GARG IN EP[facility]   . Nonischemic cardiomyopathy    . NSTEMI (non-ST elevated myocardial infarction)    . 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   .  Pacemaker 2014    MEDTRONIC ICD/PACEMAKER COMBO LAST INTERROGATION  12-12 2018   . Pneumonia 09/2016   . Primary cardiomyopathy     Ischemic cardiomyopathy EF 15% ON ECHO 09-20-2017 DR GARG IN EPIC   . Sepsis 08/2017   . Syncope and collapse     PRIOR TO ICD/PACEMAKER PLACED 2014   . Type 2 diabetes mellitus, controlled DX 1998    BS  AVG 100  A1C 7.4 Dec 27 2017   . Wheeze     PT SEE HIS PMD April 01 2018 RE THIS-TOLD TO SEE PMD BY  [facility] JUNE 12 WHEN THEY SAW HIM FOR A CL    came with the chief complaint of rectal bleeding since 2 AM this morning. Rectal bleeding was sudden onset, bright red blood in nature, occurred x2 episodes, accompanied with abdominal pain across his lower abdomen, described as cramping-like sensation. Patient denies any specific trigger for his lower GI bleed, denies taking anticoagulation, denies any NSAIDs, he does take aspirin daily, and his last colonoscopy was approximately 2 to 3 years ago at [facility]. Patient also complains of shortness of breath for the past 2 to 3 days, progressively worsening tonight, increased with exertion, positive for orthopnea, positive for wheezing and orthopnea. Patient denies any leg swelling or weight gain. However, his torsemide dose was decreased 1 month ago, from 2 mg in a.m. and 1 mg in p.m., changed to 0.5 mg in a.m. and 1  mg in p.m.Marland Kitchen Patient denies any lightheadedness, fever, chills, chest pain, palpitation, nausea, vomiting, diarrhea, melena, urinary symptoms, numbness or focal weakness.    Review of Systems:   Chief Complaint:  Rectal Bleeding    Review of Systems   Constitutional: Negative for chills, diaphoresis, fever, malaise/fatigue and weight loss.   HENT: Negative for congestion, ear pain, hearing loss, nosebleeds, sore throat and tinnitus.    Eyes: Negative for blurred vision, double vision, photophobia and pain.   Respiratory: Positive for cough, shortness of breath and wheezing. Negative for hemoptysis, sputum production and stridor.    Cardiovascular: Positive for orthopnea. Negative for chest pain, palpitations, claudication, leg swelling and PND.   Gastrointestinal: Positive for abdominal pain and blood in stool. Negative for diarrhea, heartburn, melena, nausea and vomiting.   Genitourinary: Negative for dysuria, flank pain, frequency, hematuria and urgency.   Musculoskeletal: Negative for back pain, falls, joint pain, myalgias and neck pain.   Skin: Negative for itching and rash.   Neurological: Negative for dizziness, tingling, tremors, sensory change, speech change, focal weakness, seizures, loss of consciousness and headaches.   Endo/Heme/Allergies: Negative for polydipsia. Does not bruise/bleed easily.   Psychiatric/Behavioral: Negative for depression, hallucinations, substance abuse and suicidal ideas.     Physical Exam:     Vitals:    02/17/21 0304 02/17/21 0310 02/17/21 0320 02/17/21 0420   BP:  123/59 123/59 102/73   Pulse: 65  76 77   Resp: 18      Temp: 97 F (36.1 C)      TempSrc: Temporal      SpO2: 93%  95% 93%   Weight: 89.4 kg (197 lb)      Height: 1.829 m (6')        Physical Exam:   Physical Exam  Constitutional:       General: He is not in acute distress.     Appearance: He is well-developed. He is not diaphoretic.   HENT:      Head: Normocephalic and atraumatic.  Nose: Nose normal.   Eyes:       General: No scleral icterus.     Conjunctiva/sclera: Conjunctivae normal.      Pupils: Pupils are equal, round, and reactive to light.   Neck:      Thyroid: No thyromegaly.      Vascular: No JVD.      Trachea: No tracheal deviation.   Cardiovascular:      Rate and Rhythm: Normal rate and regular rhythm.      Heart sounds: Normal heart sounds. No murmur heard.    No friction rub. No gallop.   Pulmonary:      Effort: Pulmonary effort is normal. No respiratory distress.      Breath sounds: Normal breath sounds. No stridor. No wheezing or rales.   Chest:      Chest wall: No tenderness.   Abdominal:      General: Bowel sounds are normal. There is no distension.      Palpations: Abdomen is soft. There is no mass.      Tenderness: There is abdominal tenderness. There is no guarding or rebound.      Comments: Positive tenderness across his lower abdomen   Musculoskeletal:         General: No swelling or tenderness.   Lymphadenopathy:      Cervical: No cervical adenopathy.   Skin:     General: Skin is warm.      Coloration: Skin is not pale.      Findings: No erythema or rash.   Neurological:      General: No focal deficit present.      Mental Status: He is alert and oriented to person, place, and time.      Cranial Nerves: No cranial nerve deficit.      Sensory: No sensory deficit.      Motor: No weakness or abnormal muscle tone.   Psychiatric:         Behavior: Behavior normal.         Thought Content: Thought content normal.         Judgment: Judgment normal.       Results of Labs/imaging:   Labs have been reviewed:   Coagulation Profile:   Recent Labs   Lab 02/17/21  0326   PT 13.6*   PT INR 1.2*   PTT 34       CBC review:   Recent Labs   Lab 02/17/21  0326   WBC 15.31*   Hgb 12.3*   Hematocrit 38.1   Platelets 154   MCV 97.9*   RDW 14   Neutrophils 22.6   Neutrophils Absolute 3.46   Lymphocytes Automated 64.2   Eosinophils Automated 0.5   Immature Granulocytes 0.3   Immature Granulocytes Absolute 0.04     Chem  Review:  Recent Labs   Lab 02/17/21  0326   Sodium 141   Potassium 4.6   Chloride 107   CO2 20*   BUN 32.8*   Creatinine 1.7*   Glucose 129*   Calcium 9.1   Magnesium 2.2   Bilirubin, Total 0.8   AST (SGOT) 22   ALT 27   Alkaline Phosphatase 64     Results     Procedure Component Value Units Date/Time    B-type Natriuretic Peptide [[id number]]  (Abnormal) Collected: 02/17/21 0326    Specimen: Blood Updated: 02/17/21 0435     B-Natriuretic Peptide 1,354.5 pg/mL     Cell MorpHology [[id number]] Collected: 02/17/21 0326  Updated: 02/17/21 0413     Cell Morphology Normal     Platelet Estimate Normal    CBC and differential [[id number]]  (Abnormal) Collected: 02/17/21 0326    Specimen: Blood Updated: 02/17/21 0413     WBC 15.31 x10 3/uL      Hgb 12.3 g/dL      Hematocrit 40.9 %      Platelets 154 x10 3/uL      RBC 3.89 x10 6/uL      MCV 97.9 fL      MCH 31.6 pg      MCHC 32.3 g/dL      RDW 14 %      MPV 12.3 fL      Neutrophils 22.6 %      Lymphocytes Automated 64.2 %      Monocytes 12.3 %      Eosinophils Automated 0.5 %      Basophils Automated 0.1 %      Immature Granulocytes 0.3 %      Nucleated RBC 0.0 /100 WBC      Neutrophils Absolute 3.46 x10 3/uL      Lymphocytes Absolute Automated 9.83 x10 3/uL      Monocytes Absolute Automated 1.88 x10 3/uL      Eosinophils Absolute Automated 0.08 x10 3/uL      Basophils Absolute Automated 0.02 x10 3/uL      Immature Granulocytes Absolute 0.04 x10 3/uL      Absolute NRBC 0.00 x10 3/uL     Urinalysis Reflex to Microscopic Exam- Reflex to Culture [[id number]] Collected: 02/17/21 0408    Specimen: Urine, Clean Catch Updated: 02/17/21 0408    Troponin I [[id number]] Collected: 02/17/21 0326    Specimen: Blood Updated: 02/17/21 0402     Troponin I 0.05 ng/mL     Magnesium [[id number]] Collected: 02/17/21 0326    Specimen: Blood Updated: 02/17/21 0359     Magnesium 2.2 mg/dL     GFR [[id number]] Collected: 02/17/21 0326     Updated: 02/17/21 0359     EGFR 47.9    Comprehensive metabolic  panel [[id number]]  (Abnormal) Collected: 02/17/21 0326    Specimen: Blood Updated: 02/17/21 0359     Glucose 129 mg/dL      BUN 64.4 mg/dL      Creatinine 1.7 mg/dL      Sodium 034 mEq/L      Potassium 4.6 mEq/L      Chloride 107 mEq/L      CO2 20 mEq/L      Calcium 9.1 mg/dL      Protein, Total 6.4 g/dL      Albumin 3.3 g/dL      AST (SGOT) 22 U/L      ALT 27 U/L      Alkaline Phosphatase 64 U/L      Bilirubin, Total 0.8 mg/dL      Globulin 3.1 g/dL      Albumin/Globulin Ratio 1.1     Anion Gap 14.0    Lipase [[id number]] Collected: 02/17/21 0326    Specimen: Blood Updated: 02/17/21 0359     Lipase 36 U/L     COVID-19 (SARS-CoV-2) and Influenza A/B, NAA ([facility] Rapid) - MAB candidates only [[id number]] Collected: 02/17/21 0326    Specimen: Culturette from Nasopharyngeal Updated: 02/17/21 0356     Purpose of COVID testing Diagnostic -[facility]I     SARS-CoV-2 Specimen Source Nasal Swab     SARS CoV 2 Overall Result Not Detected     Influenza  A Not Detected     Influenza B Not Detected    Narrative:      o Collect and clearly label specimen type:  o PREFERRED-Upper respiratory specimen: One Nasal Swab in  Transport Media.  o Hand deliver to laboratory ASAP  Diagnostic -[facility]    PT/APTT [[id number]]  (Abnormal) Collected: 02/17/21 0326     Updated: 02/17/21 0349     PT 13.6 sec      PT INR 1.2     PTT 34 sec     Glucose Whole Blood - POCT [[id number]]  (Abnormal) Collected: 02/17/21 0330     Updated: 02/17/21 0333     Whole Blood Glucose POCT 119 mg/dL     Type and Screen [[id number]] Collected: 02/17/21 0326    Specimen: Blood Updated: 02/17/21 0326        Radiology reports have been reviewed:  Radiology Results (24 Hour)     Procedure Component Value Units Date/Time    CT Abd/Pelvis with IV and PO Contrast [[id number]] Resulted: 02/17/21 0428    Order Status: Sent Updated: 02/17/21 0428    XR Chest  AP Portable [[id number]] Collected: 02/17/21 0356    Order Status: Completed Updated: 02/17/21 0400    Narrative:      HISTORY:  Cough    COMPARISON: 09/16/2017    FINDINGS: AP view of the chest was performed. Left cardiac pacing device  noted. The cardiomediastinal silhouette appears normal. There is no  vascular congestion. There is minimal atelectasis at the lung bases. No  pleural effusion or pneumothorax.      Impression:           No acute process.    Demetrios Isaacs, MD   02/17/2021 3:58 AM        EKG: EKG reviewed  Last EKG Result     Procedure Component Value Units Date/Time    ECG 12 lead [[id number]] Collected: 02/17/21 0322     Updated: 02/17/21 0324     Ventricular Rate 76 BPM      Atrial Rate 76 BPM      P-R Interval 130 ms      QRS Duration 90 ms      Q-T Interval 442 ms      QTC Calculation (Bezet) 497 ms      P Axis 68 degrees      R Axis 1 degrees      T Axis 60 degrees      [facility] MUSE NARRATIVE AND IMPRESSION --     Atrial-sensed ventricular-paced rhythm  ABNORMAL ECG  WHEN COMPARED WITH ECG OF  17-Sep-2017 05:07,  VENTRICULAR RATE  HAS DECREASED  BY  17 BPM      Narrative:      Atrial-sensed ventricular-paced rhythm  ABNORMAL ECG  WHEN COMPARED WITH ECG OF  17-Sep-2017 05:07,  VENTRICULAR RATE  HAS DECREASED  BY  17 BPM          Hospitalist:   Signed by:   Joseh Sjogren, MD  02/17/2021 4:35 AM    *This note was generated by the Epic EMR system/ Dragon speech recognition and may contain inherent errors or omissions not intended by the user. Grammatical errors, random word insertions, deletions, pronoun errors and incomplete sentences are occasional consequences of this technology due to software limitations. Not all errors are caught or corrected. If there are questions or concerns about the content of this note or information contained within the body of this dictation they should be addressed  directly with the author for clarification.

## 2021-02-17 NOTE — ED Provider Notes (Signed)
History     Chief Complaint   Patient presents with   . Rectal Bleeding     The history is provided by the patient, the spouse and the EMS personnel.   Abdominal Pain  Pain location:  LLQ  Pain quality: cramping    Pain radiates to:  Does not radiate  Pain severity:  Moderate  Onset quality:  Gradual  Duration:  2 days  Timing:  Intermittent  Progression:  Worsening  Chronicity:  New  Context: recent illness    Context: not alcohol use, not diet changes, not eating, not previous surgeries, not sick contacts, not suspicious food intake and not trauma    Relieved by:  Nothing  Worsened by:  Nothing  Associated symptoms: cough, hematochezia and shortness of breath    Associated symptoms: no chest pain, no chills, no constipation, no diarrhea, no dysuria, no fever, no hematuria, no nausea, no sore throat and no vomiting    Risk factors: has not had multiple surgeries     75 year male with llq pain for few days, crampy and then tonight had normal bm earlier and then bright red blood and clots pta per patient and ems. Pain in belly is worse, no vomiting. Also with cough for last 3 days, congested and wheezing noted. No fever, cough is not prod, no chest pain. No leg pain or edema. [facility] adjusted his diuretic few days ago and he reports compliant with meds. No blood thinner use. Has hx of copd and takes breo at home, no albuterol use.     PCP No[facility]   Past Medical History:   Diagnosis Date   . Acute CHF     NOV  & DEC 2018, - DIALYSIS CATH PLACED Aug 24 2017 TO REMOVE FLUID    . Acute systolic (congestive) heart failure 08/2017   . Anxiety    . CAD (coronary artery disease)    . Cardiomyopathy     nonischemic   . CHF (congestive heart failure) 08/12/2014, 2013   . Chronic obstructive pulmonary disease     POSSIBLE PER PT HE USES  SYNBICORT BID ABD  BREO INHALER PRN   . Coronary artery disease 2011    CORONARY STENT PLACEMENT FOLLOWED BY [facility]   . Depression    . echocardiogram 02/2016, 11/2016,  09/2017, 12/20/2017   . GERD (gastroesophageal reflux disease)    . Heart attack 2011    and 2014   . Hyperlipemia    . Hypertension    . ICD (implantable 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     PLACED 2014  [facility]  LAST INTERROGATION April 01 2018 REPORT REQUESTED   . Ischemic cardiomyopathy     EF 15% ON ECHO 09-20-2017 DR GARG IN EPIC   . Nonischemic cardiomyopathy    . NSTEMI (non-ST elevated myocardial infarction)    . 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   . Pacemaker 2014    [employer] ICD/PACEMAKER COMBO LAST INTERROGATION  12-12 2018   . Pneumonia 09/2016   . Primary cardiomyopathy     Ischemic cardiomyopathy EF 15% ON ECHO 09-20-2017 DR GARG IN EPIC   . Sepsis 08/2017   . Syncope and collapse     PRIOR TO ICD/PACEMAKER PLACED 2014   . Type 2 diabetes mellitus, controlled DX 1998    BS  AVG 100  A1C 7.4 Dec 27 2017   . Wheeze     PT SEE HIS PMD April 01 2018 RE THIS-TOLD  TO SEE PMD BY [facility] JUNE 12 WHEN THEY SAW HIM FOR A CL       Past Surgical History:   Procedure Laterality Date   . BIV  11/10/2016    ICD METRONIC  UPGRADE    . CARDIAC CATHETERIZATION  03/2010    CORONARY STENT PLACEMENT X 2 PER PT; LM normal, LAD 70-80% distal lesion at apex, 20% lesion mid CFX   . CARDIAC CATHETERIZATION  12/2012   . CARDIAC DEFIBRILLATOR PLACEMENT  12/2012    [facility]   . CARDIAC PACEMAKER PLACEMENT  2014    [employer]TR[employer]IC ICD/PACEMAKER PLACED [facility]   . CIRCUMCISION  AGE 42   . COLONOSCOPY  2009   . CORRECTION HAMMER TOE     . duodenal ulcer  1973    STRESS RELATED IN SCHOOL   . ECHOCARDIOGRAM, TRANSTHORACIC  02/2016 11/2016 09/2017 12/2017   . ECHOCARDIOGRAM, TRANSTHORACIC  02/2016,11/2016,12/20/2017   . EGD N/A 08/15/2014    Procedure: EGD;  Surgeon: Colon Branch, MD;  Location: [facility];  Service: Gastroenterology;  Laterality: N/A;  egd w/ bx   . EGD  1975   . EGD, COLONOSCOPY N/A 04/04/2018    Procedure: EGD with bxs, COLONOSCOPY with polypectomy and clipping;  Surgeon: Doyne Keel, MD;  Location: [facility] OR;  Service: Gastroenterology;  Laterality: N/A;   . FRACTURE SURGERY  AGE 8     RT  ANKLE- CAST APPLIED   . HERNIA REPAIR  AGE 42    LEFT INGUINAL HERNIA   . MPI nuclear study  06/2016   . orthopedic surgery  AGE 48    RT foot corrective - HAMMERTOE   . TONSILLECTOMY AND ADENOIDECTOMY  1954       Family History   Problem Relation Age of Onset   . Breast cancer Mother    . Heart attack Mother 54   . Hypertension Mother    . Diabetes Mother    . Coronary artery disease Mother    . Diabetes Brother    . Heart attack Father    . Hypertension Father        Social  Social History     Tobacco Use   . Smoking status: Never Smoker   . Smokeless tobacco: Never Used   Vaping Use   . Vaping Use: Never used   Substance Use Topics   . Alcohol use: No     Alcohol/week: 0.0 standard drinks     Comment: occasionally   . Drug use: No   .     No Known Allergies    Home Medications             ALPRAZolam (XANAX) 0.25 MG tablet     Take 0.25 mg by mouth nightly as needed     aspirin EC 81 MG EC tablet     Take 81 mg by mouth daily     atorvastatin (LIPITOR) 40 MG tablet     Take 1 tablet (40 mg total) by mouth nightly     carvedilol (COREG) 25 MG tablet     Take 1 tablet (25 mg total) by mouth 2 (two) times daily with meals     docusate sodium (COLACE) 100 MG capsule     Take by mouth     empagliflozin (JARDIANCE) 10 MG tablet     Take 1 tablet (10 mg total) by mouth daily     Entresto 97-103 MG Tab per tablet  TAKE 1 TABLET BY MOUTH TWICE DAILY     fluticasone furoate-vilanterol (Breo Ellipta) 100-25 MCG/INH Aerosol Pwdr, Breath Activated     Inhale 1 puff into the lungs daily     glucose blood (ONE TOUCH ULTRA TEST) test strip     1 each by Other route 2 (two) times daily Use as instructed     insulin glargine (LANTUS SOLOSTAR) 100 UNIT/ML injection pen     ADMINISTER 50 UNITS UNDER THE SKIN AT BEDTIME     niacin (NIASPAN) 500 MG CR tablet     TAKE 1 TABLET BY MOUTH TWICE DAILY     PARoxetine (Paxil) 20 MG tablet      Take 1 tablet (20 mg total) by mouth every morning     SITagliptin-metFORMIN (Janumet) 50-1000 MG tablet     Take 1 tablet by mouth 2 (two) times daily with meals     spironolactone (ALDACTONE) 25 MG tablet     Take 0.5 tablets (12.5 mg total) by mouth daily     tamsulosin (FLOMAX) 0.4 MG Cap     TAKE 1 CAPSULE(0.4 MG) BY MOUTH DAILY AFTER DINNER     torsemide (Demadex) 20 MG tablet     Take 1 tablet (20 mg total) by mouth every morning           Review of Systems   Constitutional: Negative for chills, diaphoresis and fever.   HENT: Negative for congestion, rhinorrhea and sore throat.    Respiratory: Positive for cough, shortness of breath and wheezing.    Cardiovascular: Negative for chest pain.   Gastrointestinal: Positive for abdominal pain, blood in stool and hematochezia. Negative for constipation, diarrhea, nausea and vomiting.   Genitourinary: Negative for dysuria and hematuria.   Musculoskeletal: Negative for back pain, gait problem and neck pain.   Neurological: Negative for weakness, numbness and headaches.       Physical Exam    BP: 123/59, Heart Rate: 65, Temp: 97 F (36.1 C), Resp Rate: 18, SpO2: 93 %, Weight: 89.4 kg    Physical Exam  Vitals and nursing note reviewed.   Constitutional:       General: He is in acute distress.      Appearance: Normal appearance. He is not ill-appearing.      Comments: Speaks in 2-3 word sentences with audible wheezing noted    HENT:      Head: Normocephalic and atraumatic.      Right Ear: External ear normal.      Left Ear: External ear normal.      Nose: Nose normal.      Mouth/Throat:      Mouth: Mucous membranes are moist.      Pharynx: No oropharyngeal exudate or posterior oropharyngeal erythema.   Eyes:      Extraocular Movements: Extraocular movements intact.      Conjunctiva/sclera: Conjunctivae normal.      Pupils: Pupils are equal, round, and reactive to light.   Cardiovascular:      Rate and Rhythm: Normal rate and regular rhythm.      Heart sounds: No murmur  heard.  Pulmonary:      Effort: Tachypnea and respiratory distress present.      Breath sounds: Examination of the right-upper field reveals wheezing. Examination of the left-upper field reveals wheezing. Examination of the right-middle field reveals wheezing. Examination of the left-middle field reveals wheezing. Examination of the right-lower field reveals decreased breath sounds and wheezing. Examination of the left-lower field reveals  decreased breath sounds. Decreased breath sounds and wheezing present. No rales.      Comments: Mild distress noted on exam with audible wheezing and speaking in 2-3 word sentences   Abdominal:      General: Abdomen is flat. Bowel sounds are normal. There is no distension.      Tenderness: There is abdominal tenderness.   Musculoskeletal:         General: No swelling or tenderness. Normal range of motion.      Cervical back: Full passive range of motion without pain and normal range of motion. No rigidity or tenderness. No spinous process tenderness.      Right lower leg: No tenderness. No edema.      Left lower leg: No tenderness. No edema.   Skin:     General: Skin is warm and dry.      Capillary Refill: Capillary refill takes less than 2 seconds.   Neurological:      General: No focal deficit present.      Mental Status: He is alert and oriented to person, place, and time. Mental status is at baseline.      Cranial Nerves: Cranial nerves are intact. No cranial nerve deficit.      Sensory: Sensation is intact. No sensory deficit.      Motor: Motor function is intact. No weakness.      Coordination: Coordination is intact. Coordination normal.   Psychiatric:         Mood and Affect: Mood normal.         Behavior: Behavior normal.           MDM and ED Course     ED Medication Orders (From admission, onward)    Start Ordered     Status Ordering Provider    02/17/21 2200 02/17/21 0509  atorvastatin (LIPITOR) tablet 40 mg  At bedtime        Route: Oral  Ordered Dose: 40 mg     Ordered  HARTOJO, WIBISONO    02/17/21 2200 02/17/21 0513  insulin lispro (HumaLOG) injection 1-3 Units  At bedtime        Route: Subcutaneous  Ordered Dose: 1-3 Units    "And" Linked Group Details    Ordered HARTOJO, WIBISONO    02/17/21 1700 02/17/21 0511  furosemide (LASIX) injection 20 mg  Every 12 hours        Route: Intravenous  Ordered Dose: 20 mg     Ordered HARTOJO, WIBISONO    02/17/21 0900 02/17/21 0509  docusate sodium (COLACE) capsule 100 mg  2 times daily        Route: Oral  Ordered Dose: 100 mg     Ordered HARTOJO, WIBISONO    02/17/21 0900 02/17/21 0509  empagliflozin (JARDIANCE) tablet 10 mg  Daily        Route: Oral  Ordered Dose: 10 mg     Ordered HARTOJO, WIBISONO    02/17/21 0900 02/17/21 0509  sacubitril-valsartan (ENTRESTO) 97-103 MG per tablet 1 tablet  2 times daily        Route: Oral  Ordered Dose: 1 tablet     Ordered HARTOJO, WIBISONO    02/17/21 0900 02/17/21 0509  fluticasone furoate-vilanterol (BREO ELLIPTA) 100-25 MCG/INH 1 puff  RT - Every morning        Route: Inhalation  Ordered Dose: 1 puff     Ordered HARTOJO, WIBISONO    02/17/21 0900 02/17/21 0509  niacin (NIASPAN) CR tablet 500 mg  2 times daily        Route: Oral  Ordered Dose: 500 mg     Ordered HARTOJO, WIBISONO    02/17/21 0900 02/17/21 0509  PARoxetine (PAXIL) tablet 20 mg  Daily        Route: Oral  Ordered Dose: 20 mg     Ordered HARTOJO, WIBISONO    02/17/21 0900 02/17/21 0509  SITagliptin (JANUVIA) tablet 50 mg  Daily        Route: Oral  Ordered Dose: 50 mg     Ordered HARTOJO, WIBISONO    02/17/21 0900 02/17/21 0509  spironolactone (ALDACTONE) tablet 12.5 mg  Daily        Route: Oral  Ordered Dose: 12.5 mg     Ordered HARTOJO, WIBISONO    02/17/21 0800 02/17/21 0509  carvedilol (COREG) tablet 25 mg  Every 12 hours cardiac        Route: Oral  Ordered Dose: 25 mg     Ordered HARTOJO, WIBISONO    02/17/21 0745 02/17/21 0513  insulin lispro (HumaLOG) injection 1-5 Units  3 times daily before meals        Route: Subcutaneous   Ordered Dose: 1-5 Units    "And" Linked Group Details    Craige Cotta    02/17/21 0513 02/17/21 0513  dextrose (D10W) 10% bolus 250 mL  As needed        Route: Intravenous  Ordered Dose: 25 g    "And" Linked Group Details    Craige Cotta    02/17/21 0513 02/17/21 0513  glucagon (rDNA) (GLUCAGEN) injection 1 mg  As needed        Route: Intramuscular  Ordered Dose: 1 mg    "And" Linked Group Details    Craige Cotta    02/17/21 0513 02/17/21 0513  dextrose 5 % bolus 250 mL  As needed        Route: Intravenous  Ordered Dose: 250 mL    "And" Linked Group Details    Craige Cotta    02/17/21 0513 02/17/21 0513  dextrose 50 % bolus 25 g  As needed        Route: Intravenous  Ordered Dose: 25 g    "And" Linked Group Details    Ordered Dorthula Nettles    02/17/21 0509 02/17/21 0509  ALPRAZolam (XANAX) tablet 0.25 mg  At bedtime PRN        Route: Oral  Ordered Dose: 0.25 mg     Ordered HARTOJO, WIBISONO    02/17/21 0505 02/17/21 0505  acetaminophen (TYLENOL) tablet 650 mg  Every 4 hours PRN        Route: Oral  Ordered Dose: 650 mg     Ordered HARTOJO, WIBISONO    02/17/21 0505 02/17/21 0505  ondansetron (ZOFRAN) injection 4 mg  Every 6 hours PRN        Route: Intravenous  Ordered Dose: 4 mg     Ordered HARTOJO, WIBISONO    02/17/21 0503 02/17/21 0502  furosemide (LASIX) injection 40 mg  Once        Route: Intravenous  Ordered Dose: 40 mg     Last MAR action: Given Cay Kath J    02/17/21 0425 02/17/21 0426  iohexol (OMNIPAQUE) 350 MG/ML injection 100 mL  IMG once as needed        Route: Intravenous  Ordered Dose: 100 mL     Last MAR action: Imaging Agent Given Pesotum, [employer]  02/17/21 0405 02/17/21 0404  morphine injection 4 mg  Once        Route: Intravenous  Ordered Dose: 4 mg     Last MAR action: Given Alexius Hangartner, [facility] J    02/17/21 0405 02/17/21 0404  ondansetron (ZOFRAN) injection 4 mg  Once        Route: Intravenous  Ordered Dose: 4 mg     Last MAR action: Given  Marlane Hatcher J    02/17/21 0328 02/17/21 0328  iohexol (OMNIPAQUE) 12 mg/mL oral solution (premix) 1,000 mL  IMG once as needed        Route: Oral  Ordered Dose: 1,000 mL     Last MAR action: Imaging Agent Given Reginia Forts    02/17/21 1610 02/17/21 0316  nitroglycerin (NITRO-BID) 2 % ointment 0.5 inch  Once        Route: Topical  Ordered Dose: 0.5 inch     Last MAR action: Ointment Applied Marlane Hatcher J    02/17/21 0314 02/17/21 0313  albuterol (PROVENTIL) (2.5 MG/13ML) 0.083% nebulizer solution 2.5 mg  RT - Once        Route: Nebulization  Ordered Dose: 2.5 mg     Last MAR action: Given Etsuko Dierolf J             MDM  Number of Diagnoses or Management Options  Acute on chronic systolic CHF (congestive heart failure)  Bronchospasm  Coronary artery disease involving coronary bypass graft of native heart with unstable angina pectoris  Cough  LGI bleed  SOB (shortness of breath)  Stage 3 chronic kidney disease, 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 Marlane Hatcher  is the primary attending for this patient and has obtained and performed the history, PE, and medical decision making for this patient.    Oxygen saturation by pulse oximetry is 95%-100%, Normal.  Interventions: None Needed and Patient Observed.    When I was within 6 feet of this patient I donned the following PPE:  Surgical Mask Yes, Gloves No  , Gown No  ; Goggles Yes; Face Shield No  , 13M 6000 Respirator No  ; N95 No  .  The patient was wearing a mask during my evaluation Yes.    DDx includes colitis, diverticulitis, avm, polyp, not on blood thinners, will check labs, t&s, ct abdomen and pain meds.   Cough with resp distress and wheezing, symptoms felt to be related to his chronic chf with possible acute component, ddx includes chf, pneumonia, bronchitis, allergic related, covid, will check labs, chest x ray, ekg and neb.     EKG  Interpretation  EKG interpreted by EDP  Compared to prior EKG dated 09/17/17   Rate: Paced  Rhythm: A sensed V paced rhythm  Axis: Normal  ST Segments: Normal ST segments  Conduction: No blocks  Impression: Paced rhythm with resolved t wave inversion laterally from 2018 ekg   Reginia Forts, MD   [phone]    Improved breath sounds on re check after neb, pox 97 on 2 liters, sentences clear and no distress noted     Results     Procedure Component Value Units Date/Time    B-type Natriuretic Peptide ([id number])  (Abnormal) Collected: 02/17/21   0326    Specimen: Blood Updated: 02/17/21 0435     B-Natriuretic Peptide 1,354.5 pg/mL     Cell MorpHology ([id number])  Collected: 02/17/21 0326     Updated: 02/17/21 0413     Cell Morphology Normal     Platelet Estimate Normal    CBC and differential ([id number])  (Abnormal) Collected: 02/17/21 0326    Specimen: Blood Updated: 02/17/21 0413     WBC 15.31 x10 3/uL      Hgb 12.3 g/dL      Hematocrit 40.9 %      Platelets 154 x10 3/uL      RBC 3.89 x10 6/uL      MCV 97.9 fL      MCH 31.6 pg      MCHC 32.3 g/dL      RDW 14 %      MPV 12.3 fL      Neutrophils 22.6 %      Lymphocytes Automated 64.2 %      Monocytes 12.3 %      Eosinophils Automated 0.5 %      Basophils Automated 0.1 %      Immature Granulocytes 0.3 %      Nucleated RBC 0.0 /100 WBC      Neutrophils Absolute 3.46 x10 3/uL      Lymphocytes Absolute Automated 9.83 x10 3/uL      Monocytes Absolute Automated 1.88 x10 3/uL      Eosinophils Absolute Automated 0.08 x10 3/uL      Basophils Absolute Automated 0.02 x10 3/uL      Immature Granulocytes Absolute 0.04 x10 3/uL      Absolute NRBC 0.00 x10 3/uL     Urinalysis Reflex to Microscopic Exam- Reflex to Culture ([id number])   Collected: 02/17/21 0408    Specimen: Urine, Clean Catch Updated: 02/17/21 0408    Troponin I ([id number]) Collected: 02/17/21 0326    Specimen: Blood Updated: 02/17/21 0402     Troponin I 0.05 ng/mL     Magnesium ([id number]) Collected: 02/17/21 0326     Specimen: Blood Updated: 02/17/21 0359     Magnesium 2.2 mg/dL     GFR ([id number]) Collected: 02/17/21 0326     Updated: 02/17/21 0359     EGFR 47.9    Comprehensive metabolic panel ([id number])  (Abnormal) Collected: 02/17/21   0326    Specimen: Blood Updated: 02/17/21 0359     Glucose 129 mg/dL      BUN 64.4 mg/dL      Creatinine 1.7 mg/dL      Sodium 034 mEq/L      Potassium 4.6 mEq/L      Chloride 107 mEq/L      CO2 20 mEq/L      Calcium 9.1 mg/dL      Protein, Total 6.4 g/dL      Albumin 3.3 g/dL      AST (SGOT) 22 U/L      ALT 27 U/L      Alkaline Phosphatase 64 U/L      Bilirubin, Total 0.8 mg/dL      Globulin 3.1 g/dL      Albumin/Globulin Ratio 1.1     Anion Gap 14.0    Lipase ([id number]) Collected: 02/17/21 0326    Specimen: Blood Updated: 02/17/21 0359     Lipase 36 U/L     COVID-19 (SARS-CoV-2) and Influenza A/B, NAA ([facility] Rapid) - MAB   candidates only ([id number]) Collected: 02/17/21 0326    Specimen: Culturette from Nasopharyngeal Updated: 02/17/21 0356     Purpose of COVID testing Diagnostic -PUI     SARS-CoV-2 Specimen Source Nasal Swab  SARS CoV 2 Overall Result Not Detected     Influenza A Not Detected     Influenza B Not Detected    Narrative:      o Collect and clearly label specimen type:  o PREFERRED-Upper respiratory specimen: One Nasal Swab in  Transport Media.  o Hand deliver to laboratory ASAP  Diagnostic -PUI    PT/APTT ([id number])  (Abnormal) Collected: 02/17/21 0326     Updated: 02/17/21 0349     PT 13.6 sec      PT INR 1.2     PTT 34 sec     Glucose Whole Blood - POCT ([id number])  (Abnormal) Collected: 02/17/21   0330     Updated: 02/17/21 0333     Whole Blood Glucose POCT 119 mg/dL     Type and Screen ([id number]) Collected: 02/17/21 0326    Specimen: Blood Updated: 02/17/21 0326        Radiology Results (24 Hour)     Procedure Component Value Units Date/Time    CT Abd/Pelvis with IV and PO Contrast ([id number]) Collected: 02/17/21   0439    Order Status: Completed Updated: 02/17/21  0451    Narrative:      HISTORY: Lower abdominal plain with rectal bleeding.    COMPARISON: CT abdomen pelvis from 08/22/2017    TECHNIQUE: CT abdomen and pelvis WITH contrast. 100 mL IV Omnipaque 350  was administered. Oral contrast was administered.  The following dose  reduction techniques were utilized: Automated exposure control and/or  adjustment of the mA and/or kV according to patient size, and the use of  iterative reconstruction technique.    FINDINGS:     LOWER THORAX:  There are scattered nodular and tree-in-bud and  groundglass opacities in the lung bases. There is linear atelectasis  versus scarring in the lower lobes. There are partially imaged  biventricular pacing wires. There is cardiomegaly with left atrial  enlargement. There is a calcified granuloma in the right lower lobe.    LIVER/BILIARY TREE:  No mass or intrahepatic biliary dilatation. No  gallbladder distension or calcified gallstones.  SPLEEN: No splenomegaly.  PANCREAS:  No pancreatic mass or duct dilatation.    KIDNEY/URETERS: No hydronephrosis, stones or solid mass lesions. There  are multiple cysts in both kidneys. The largest in the left kidney  measures 8.8 x 7 cm in the inferior pole. The largest in the right  kidney measures 6.2 x 6 cm in the upper pole. Additional subcentimeter  hypodensities are too small to characterize.  ADRENALS:  No adrenal mass.  PELVIC ORGANS/BLADDER: No pelvic masses.    PERITONEUM/RETROPERITONEUM: No free air or fluid.  LYMPH NODES: There is porta hepatis, mesenteric, and left greater the  right iliac chain lymphadenopathy.  VESSELS: No aortic aneurysm. There is mild multifocal atherosclerotic  calcification of the aortoiliac vessels.    GI TRACT: No bowel wall thickening or dilation. The appendix is normal.    BONES AND SOFT TISSUES:  There is a small fat-containing right inguinal  hernia. There are is moderate severe multisegmental degenerative  spondylosis most pronounced in the lower lumbar spine.  There is no acute  osseous abnormality.      Impression:          1.  Bilateral airspace opacities suggestive of bronchiolitis.  2.  No acute inflammatory process involving the abdomen or pelvis.  3.  Mesenteric, porta hepatis, and inguinal lymphadenopathy of uncertain  etiology. Correlate with clinical history.  4.  Multiple renal cysts  and subcentimeter hypodensities that are too  small to characterize. These can be better evaluated with MRI on a  nonemergent basis as clinically indicated. Overall these are similar to  exam from 08/22/2017    Theador Hawthorne, MD   02/17/2021 4:49 AM    XR Chest  AP Portable ([id number]) Collected: 02/17/21 0356    Order Status: Completed Updated: 02/17/21 0400    Narrative:      HISTORY: Cough    COMPARISON: 09/16/2017    FINDINGS: AP view of the chest was performed. Left cardiac pacing device  noted. The cardiomediastinal silhouette appears normal. There is no  vascular congestion. There is minimal atelectasis at the lung bases. No  pleural effusion or pneumothorax.      Impression:           No acute process.    Demetrios Isaacs, MD   02/17/2021 3:58 AM      Discussed with patient and wife about ct and labs, no sob on re check, breath sounds with scattered wheezing.   Aware of renal cysts on ct            Amount and/or Complexity of Data Reviewed  Clinical lab tests: ordered and reviewed  Tests in the radiology section of CPT: ordered and reviewed    Patient Progress  Patient progress: improved                   Critical Care  Performed by: Reginia Forts, MD  Authorized by: Haze Boyden, MD     Critical care provider statement:     Critical care time (minutes):  47    Critical care was necessary to treat or prevent imminent or life-threatening deterioration of the following conditions:  Respiratory failure    Critical care was time spent personally by me on the following activities:  Development of treatment plan with patient or surrogate, discussions with consultants, evaluation of  patient's response to treatment, obtaining history from patient or surrogate, ordering and review of laboratory studies, ordering and review of radiographic studies, pulse oximetry, re-evaluation of patient's condition and review of old charts    I assumed direction of critical care for this patient from another provider in my specialty: no          Clinical Impression & Disposition     Clinical Impression  Final diagnoses:   LGI bleed   Cough   Stage 3 chronic kidney disease, 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   SOB (shortness of breath)   Bronchospasm   Type 2 diabetes mellitus with complication, with long-term current use of insulin   Coronary artery disease involving coronary bypass graft of native heart with unstable angina pectoris   Acute on chronic systolic CHF (congestive heart failure)   COVID-19 ruled out by laboratory testing   Acute on chronic renal insufficiency   Elevated brain natriuretic peptide (BNP) level        ED Disposition    ED Disposition: Admit  Condition: None  Date/Time: Mon Feb 17, 2021  5:05 AM  Comment: Admitting Physician: Dorthula Nettles [phone]   Service:: Cardiology [104]   Estimated Length of Stay: > or = to 2 midnights   Tentative Discharge Plan?: Home or Self Care [1]            New Prescriptions    No medications on file                 Tecia Cinnamon, Lindalou Hose,  MD  02/17/21 1610

## 2021-02-17 NOTE — Consults (Addendum)
HEART CARDIOLOGY CONSULTATION REPORT  [facility]    Date Time: 02/17/21 8:56 AM  Patient Name: [name] [name]  Requesting Physician: Shanon Brow, *           History:   [name] [name] is a 75 y.o. male admitted on 02/17/2021, for whom we are asked to provide cardiac consultation, regarding chf.  He was recently started on Jardiance.  His diuretics dose were cut in half.  He is complaining of abdominal swelling but no lower extremity edema.  He feels short of breath and does have orthopnea.  Denies any chest pain.  He does state that he does urinate a lot due to the diuretics in the evening.    Past Medical History:     Past Medical History:   Diagnosis Date   . Acute CHF     NOV  & DEC 2018, - DIALYSIS CATH PLACED Aug 24 2017 TO REMOVE FLUID    . Acute systolic (congestive) heart failure 08/2017   . Anxiety    . CAD (coronary artery disease)    . Cardiomyopathy     nonischemic   . CHF (congestive heart failure) 08/12/2014, 2013   . Chronic obstructive pulmonary disease     POSSIBLE PER PT HE USES  SYNBICORT BID ABD  BREO INHALER PRN   . Coronary artery disease 2011    CORONARY STENT PLACEMENT FOLLOWED BY [facility]   . Depression    . echocardiogram 02/2016, 11/2016, 09/2017, 12/20/2017   . GERD (gastroesophageal reflux disease)    . Heart attack 2011    and 2014   . Hyperlipemia    . Hypertension    . ICD (implantable 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     PLACED 2014  [facility]  LAST INTERROGATION April 01 2018 REPORT REQUESTED   . Ischemic cardiomyopathy     EF 15% ON ECHO 09-20-2017 DR GARG IN EPIC   . Nonischemic cardiomyopathy    . NSTEMI (non-ST elevated myocardial infarction)    . 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   . Pacemaker 2014    MEDTRONIC ICD/PACEMAKER COMBO LAST INTERROGATION  12-12 2018   . Pneumonia 09/2016   . Primary cardiomyopathy     Ischemic cardiomyopathy EF 15% ON ECHO 09-20-2017 DR GARG IN EPIC   . Sepsis 08/2017   . Syncope and collapse     PRIOR TO ICD/PACEMAKER PLACED  2014   . Type 2 diabetes mellitus, controlled DX 1998    BS  AVG 100  A1C 7.4 Dec 27 2017   . Wheeze     PT SEE HIS PMD April 01 2018 RE THIS-TOLD TO SEE PMD BY [facility] JUNE 12 WHEN THEY SAW HIM FOR A CL       Past Surgical History:     Past Surgical History:   Procedure Laterality Date   . BIV  11/10/2016    ICD METRONIC  UPGRADE    . CARDIAC CATHETERIZATION  03/2010    CORONARY STENT PLACEMENT X 2 PER PT; LM normal, LAD 70-80% distal lesion at apex, 20% lesion mid CFX   . CARDIAC CATHETERIZATION  12/2012   . CARDIAC DEFIBRILLATOR PLACEMENT  12/2012    [facility]   . CARDIAC PACEMAKER PLACEMENT  2014    MEDTRONIC ICD/PACEMAKER PLACED  HEART   . CIRCUMCISION  AGE 15   . COLONOSCOPY  2009   . CORRECTION HAMMER TOE     . duodenal ulcer  1973    STRESS  RELATED IN SCHOOL   . ECHOCARDIOGRAM, TRANSTHORACIC  02/2016 11/2016 09/2017 12/2017   . ECHOCARDIOGRAM, TRANSTHORACIC  02/2016,11/2016,12/20/2017   . EGD N/A 08/15/2014    Procedure: EGD;  Surgeon: Colon Branch, MD;  Location: [facility];  Service: Gastroenterology;  Laterality: N/A;  egd w/ bx   . EGD  1975   . EGD, COLONOSCOPY N/A 04/04/2018    Procedure: EGD with bxs, COLONOSCOPY with polypectomy and clipping;  Surgeon: Doyne Keel, MD;  Location: [facility];  Service: Gastroenterology;  Laterality: N/A;   . FRACTURE SURGERY  AGE 89     RT  ANKLE- CAST APPLIED   . HERNIA REPAIR  AGE 73    LEFT INGUINAL HERNIA   . MPI nuclear study  06/2016   . orthopedic surgery  AGE 51    RT foot corrective - HAMMERTOE   . TONSILLECTOMY AND ADENOIDECTOMY  1954       Family History:     Family History   Problem Relation Age of Onset   . Breast cancer Mother    . Heart attack Mother 82   . Hypertension Mother    . Diabetes Mother    . Coronary artery disease Mother    . Diabetes Brother    . Heart attack Father    . Hypertension Father        Social History:     Social History     Socioeconomic History   . Marital status: Married     Spouse name: Lajoyce Corners   .  Number of children: 0   . Years of education: Not on file   . Highest education level: Not on file   Occupational History   . 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 Smoking status: Never Smoker   . Smokeless tobacco: Never Used   Vaping Use   . Vaping Use: Never used   Substance and Sexual Activity   . Alcohol use: No     Alcohol/week: 0.0 standard drinks     Comment: occasionally   . Drug use: No   . Sexual activity: Yes     Partners: Female   Other Topics Concern   . Not on file   Social History Narrative   . Not on file     Social Determinants of Health     Financial Resource Strain: Not on file   Food Insecurity: Not on file   Transportation Needs: Not on file   Physical Activity: Not on file   Stress: Not on file   Social Connections: Not on file   Intimate Partner Violence: Not on file   Housing Stability: Not on file       Allergies:   No Known Allergies    Medications:     No current facility-administered medications on file prior to encounter.     Current Outpatient Medications on File Prior to Encounter   Medication Sig Dispense Refill   . aspirin EC 81 MG EC tablet Take 81 mg by mouth daily     . atorvastatin (LIPITOR) 40 MG tablet Take 1 tablet (40 mg total) by mouth nightly 90 tablet 0   . carvedilol (COREG) 25 MG tablet Take 1 tablet (25 mg total) by mouth 2 (two) times daily with meals 180 tablet 3   . docusate sodium (COLACE) 100 MG capsule Take by mouth daily     . empagliflozin (JARDIANCE) 10 MG tablet Take 1 tablet (  10 mg total) by mouth daily 90 tablet 3   . Entresto 97-103 MG Tab per tablet TAKE 1 TABLET BY MOUTH TWICE DAILY 180 tablet 3   . glucose blood (ONE TOUCH ULTRA TEST) test strip 1 each by Other route 2 (two) times daily Use as instructed 300 each 1   . insulin glargine (LANTUS SOLOSTAR) 100 UNIT/ML injection pen ADMINISTER 50 UNITS UNDER THE SKIN AT BEDTIME 45 mL 3   . spironolactone (ALDACTONE) 25 MG tablet Take 0.5 tablets (12.5 mg total) by mouth daily 45 tablet  3   . tamsulosin (FLOMAX) 0.4 MG Cap TAKE 1 CAPSULE(0.4 MG) BY MOUTH DAILY AFTER DINNER 90 capsule 3   . torsemide (Demadex) 20 MG tablet Take 1 tablet (20 mg total) by mouth every morning 90 tablet 3   . ALPRAZolam (XANAX) 0.25 MG tablet Take 0.25 mg by mouth nightly as needed     . fluticasone furoate-vilanterol (Breo Ellipta) 100-25 MCG/INH Aerosol Pwdr, Breath Activated Inhale 1 puff into the lungs daily (Patient not taking: Reported on 02/17/2021) 3 each 3   . niacin (NIASPAN) 500 MG CR tablet TAKE 1 TABLET BY MOUTH TWICE DAILY 180 tablet 3   . PARoxetine (Paxil) 20 MG tablet Take 1 tablet (20 mg total) by mouth every morning 30 tablet 11   . SITagliptin-metFORMIN (Janumet) 50-1000 MG tablet Take 1 tablet by mouth 2 (two) times daily with meals 180 tablet 3         Review of Systems:    Comprehensive review of systems including constitutional, eyes, ears, nose, mouth, throat, cardiovascular, GI, GU, musculoskeletal, integumentary, respiratory, neurologic, psychiatric, and endocrine is negative other than what is mentioned already in the history of present illness    Physical Exam:     Vitals:    02/17/21 0815   BP: 113/79   Pulse: 68   Resp: 21   Temp: (!) 96.9 F (36.1 C)   SpO2: 96%     Temp (24hrs), Avg:96.9 F (36.1 C), Min:96.7 F (35.9 C), Max:97 F (36.1 C)      GENERAL: Patient is in no acute distress   HEENT: No conjunctival lesions, no pallor of the oral mucosa  NECK: + jugular venous distention, normal carotid upstrokes without bruits   CARDIAC: Normal apical impulse, regular rate and rhythm, with normal S1 and S2, no murmurs, rubs, or gallops   CHEST: wheezing bilaterally, normal respiratory effort  ABDOMEN: No abdominal bruits, distended nontender, no increased abdominal aorta  EXTREMITIES: No clubbing, cyanosis, or edema, 2+ femoral pulses bilaterally without bruits  SKIN: No lesions  NEUROLOGIC: Alert and oriented to time, place and person, normal mood and affect  MUSCULOSKELETAL: Normal muscle  strength and tone.      Labs Reviewed:     Recent Labs   Lab 02/17/21  0326   Troponin I 0.05             Recent Labs   Lab 02/17/21  0326   ALT 27   AST (SGOT) 22     Recent Labs   Lab 02/17/21  0326   Magnesium 2.2     Recent Labs   Lab 02/17/21  0326   PT 13.6*   PT INR 1.2*   PTT 34     Recent Labs   Lab 02/17/21  0326   WBC 15.31*   Hgb 12.3*   Hematocrit 38.1   Platelets 154     Recent Labs   Lab 02/17/21  0326  Sodium 141   Potassium 4.6   Chloride 107   CO2 20*   BUN 32.8*   Creatinine 1.7*   EGFR 47.9   Glucose 129*     Lab Results   Component Value Date    BNP 1,354.5 (H) 02/17/2021     Estimated Creatinine Clearance: 45.6 mL/min (A) (based on SCr of 1.7 mg/dL (H)).  EKG: normal EKG, normal sinus rhythm, unchanged from previous tracings, a sensed vpaced.  Radiology      Radiology Results (24 Hour)     Procedure Component Value Units Date/Time    CT Abd/Pelvis with IV and PO Contrast [[id number]] Collected: 02/17/21 0439    Order Status: Completed Updated: 02/17/21 0451    Narrative:      HISTORY: Lower abdominal plain with rectal bleeding.    COMPARISON: CT abdomen pelvis from 08/22/2017    TECHNIQUE: CT abdomen and pelvis WITH contrast. 100 mL IV Omnipaque 350  was administered. Oral contrast was administered.  The following dose  reduction techniques were utilized: Automated exposure control and/or  adjustment of the mA and/or kV according to patient size, and the use of  iterative reconstruction technique.    FINDINGS:     LOWER THORAX:  There are scattered nodular and tree-in-bud and  groundglass opacities in the lung bases. There is linear atelectasis  versus scarring in the lower lobes. There are partially imaged  biventricular pacing wires. There is cardiomegaly with left atrial  enlargement. There is a calcified granuloma in the right lower lobe.    LIVER/BILIARY TREE:  No mass or intrahepatic biliary dilatation. No  gallbladder distension or calcified gallstones.  SPLEEN: No  splenomegaly.  PANCREAS:  No pancreatic mass or duct dilatation.    KIDNEY/URETERS: No hydronephrosis, stones or solid mass lesions. There  are multiple cysts in both kidneys. The largest in the left kidney  measures 8.8 x 7 cm in the inferior pole. The largest in the right  kidney measures 6.2 x 6 cm in the upper pole. Additional subcentimeter  hypodensities are too small to characterize.  ADRENALS:  No adrenal mass.  PELVIC ORGANS/BLADDER: No pelvic masses.    PERITONEUM/RETROPERITONEUM: No free air or fluid.  LYMPH NODES: There is porta hepatis, mesenteric, and left greater the  right iliac chain lymphadenopathy.  VESSELS: No aortic aneurysm. There is mild multifocal atherosclerotic  calcification of the aortoiliac vessels.    GI TRACT: No bowel wall thickening or dilation. The appendix is normal.    BONES AND SOFT TISSUES:  There is a small fat-containing right inguinal  hernia. There are is moderate severe multisegmental degenerative  spondylosis most pronounced in the lower lumbar spine. There is no acute  osseous abnormality.      Impression:          1.  Bilateral airspace opacities suggestive of bronchiolitis.  2.  No acute inflammatory process involving the abdomen or pelvis.  3.  Mesenteric, porta hepatis, and inguinal lymphadenopathy of uncertain  etiology. Correlate with clinical history.  4.  Multiple renal cysts and subcentimeter hypodensities that are too  small to characterize. These can be better evaluated with MRI on a  nonemergent basis as clinically indicated. Overall these are similar to  exam from 08/22/2017    Theador Hawthorne, MD   02/17/2021 4:49 AM    XR Chest  AP Portable [[id number]] Collected: 02/17/21 0356    Order Status: Completed Updated: 02/17/21 0400    Narrative:      HISTORY: Cough  COMPARISON: 09/16/2017    FINDINGS: AP view of the chest was performed. Left cardiac pacing device  noted. The cardiomediastinal silhouette appears normal. There is no  vascular congestion. There is minimal  atelectasis at the lung bases. No  pleural effusion or pneumothorax.      Impression:           No acute process.    Demetrios Isaacs, MD   02/17/2021 3:58 AM        Assessment:    Acute on chronic systolic congestive heart failure.   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  He will resume 40mg  in the morning upon d/c            Signed by: Marian Sorrow, MD, M.D., [facility]      [facility]  NP Spectralink [phone] (8am-5pm)  MD Spectralink [phone] (8am-5pm)  After hours, non urgent consult line [phone]  After Hours, urgent consults [phone]

## 2021-02-18 LAB — CBC AND DIFFERENTIAL
Absolute NRBC: 0 10*3/uL (ref 0.00–0.00)
Hematocrit: 38.5 % (ref 37.6–49.6)
Hgb: 12.5 g/dL (ref 12.5–17.1)
MCH: 31.7 pg (ref 25.1–33.5)
MCHC: 32.5 g/dL (ref 31.5–35.8)
MCV: 97.7 fL — ABNORMAL HIGH (ref 78.0–96.0)
MPV: 12 fL (ref 8.9–12.5)
Nucleated RBC: 0 /100 WBC (ref 0.0–0.0)
Platelets: 176 10*3/uL (ref 142–346)
RBC: 3.94 10*6/uL — ABNORMAL LOW (ref 4.20–5.90)
RDW: 14 % (ref 11–15)
WBC: 16.13 10*3/uL — ABNORMAL HIGH (ref 3.10–9.50)

## 2021-02-18 LAB — ECG 12-LEAD
P Axis: 68 degrees
P-R Interval: 130 ms
QRS Duration: 90 ms
QTC Calculation (Bezet): 497 ms
T Axis: 60 degrees

## 2021-02-18 LAB — COMPREHENSIVE METABOLIC PANEL
ALT: 22 U/L (ref 0–55)
AST (SGOT): 15 U/L (ref 5–34)
Albumin/Globulin Ratio: 1 (ref 0.9–2.2)
Albumin: 3.1 g/dL — ABNORMAL LOW (ref 3.5–5.0)
Alkaline Phosphatase: 62 U/L (ref 38–106)
Anion Gap: 13 (ref 5.0–15.0)
BUN: 31 mg/dL — ABNORMAL HIGH (ref 9.0–28.0)
Bilirubin, Total: 0.9 mg/dL (ref 0.2–1.2)
CO2: 22 mEq/L (ref 22–29)
Calcium: 8.8 mg/dL (ref 7.9–10.2)
Chloride: 101 mEq/L (ref 100–111)
Creatinine: 1.6 mg/dL — ABNORMAL HIGH (ref 0.7–1.3)
Globulin: 3.1 g/dL (ref 2.0–3.6)
Glucose: 209 mg/dL — ABNORMAL HIGH (ref 70–100)
Potassium: 4.3 mEq/L (ref 3.5–5.1)
Protein, Total: 6.2 g/dL (ref 6.0–8.3)
Sodium: 136 mEq/L (ref 136–145)

## 2021-02-18 LAB — MAN DIFF ONLY
Atypical Lymphocytes %: 5 %
Atypical Lymphocytes Absolute: 0.81 10*3/uL — ABNORMAL HIGH (ref 0.00–0.00)
Band Neutrophils Absolute: 0 10*3/uL (ref 0.00–1.00)
Band Neutrophils: 0 %
Basophils Absolute Manual: 0 10*3/uL (ref 0.00–0.08)
Basophils Manual: 0 %
Eosinophils Absolute Manual: 0 10*3/uL (ref 0.00–0.44)
Eosinophils Manual: 0 %
Lymphocytes Absolute Manual: 10.65 10*3/uL — ABNORMAL HIGH (ref 0.42–3.22)
Lymphocytes Manual: 66 %
Monocytes Absolute: 1.13 10*3/uL — ABNORMAL HIGH (ref 0.21–0.85)
Monocytes Manual: 7 %
Neutrophils Absolute Manual: 3.55 10*3/uL (ref 1.10–6.33)
Segmented Neutrophils: 22 %

## 2021-02-18 LAB — GLUCOSE WHOLE BLOOD - POCT
Whole Blood Glucose POCT: 194 mg/dL — ABNORMAL HIGH (ref 70–100)
Whole Blood Glucose POCT: 299 mg/dL — ABNORMAL HIGH (ref 70–100)
Whole Blood Glucose POCT: 134 mg/dL — ABNORMAL HIGH (ref 70–100)
Whole Blood Glucose POCT: 163 mg/dL — ABNORMAL HIGH (ref 70–100)

## 2021-02-18 LAB — CELL MORPHOLOGY
Cell Morphology: NORMAL
Platelet Estimate: NORMAL

## 2021-02-18 LAB — GFR: EGFR: 51.4

## 2021-02-18 LAB — MAGNESIUM: Magnesium: 2.2 mg/dL (ref 1.6–2.6)

## 2021-02-18 MED ORDER — GUAIFENESIN 100 MG/5ML PO SOLN
200.0000 mg | ORAL | Status: DC | PRN
Start: 2021-02-18 — End: 2021-02-19
  Administered 2021-02-18: 200 mg via ORAL
  Filled 2021-02-18: qty 10

## 2021-02-18 MED ORDER — TORSEMIDE 20 MG PO TABS
40.0000 mg | ORAL_TABLET | Freq: Every day | ORAL | Status: DC
Start: 2021-02-18 — End: 2021-02-19
  Administered 2021-02-18 – 2021-02-19 (×2): 40 mg via ORAL
  Filled 2021-02-18 (×3): qty 2

## 2021-02-18 MED ORDER — ASPIRIN 81 MG PO CHEW
81.0000 mg | CHEWABLE_TABLET | Freq: Every day | ORAL | Status: DC
Start: 2021-02-18 — End: 2021-02-19
  Administered 2021-02-18 – 2021-02-19 (×2): 81 mg via ORAL
  Filled 2021-02-18 (×2): qty 1

## 2021-02-18 NOTE — Consults (Signed)
[facility] HEMATOLOGY ONCOLOGY CONSULTATION    Date Time: 02/18/21 5:01 PM  Patient Name: [name] [name]  Requesting Physician: Haze Boyden, MD      Reason for Consultation:   I was asked to see [name] [name] at the request of Dr. Ardelle Park for recommendations regarding adenopathy.      Assessment:   [name] [name] is a 75 y.o. 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 Cannot rule out underlying lymphoproliferative disorder.  Will follow as out patient  Thanks     Written recommendations were provided at time of evaluation.   I appreciate the opportunity to participate in the care of this interesting and pleasant patient.        History:   [name] [name] is a 75 y.o. male who presents to the hospital on 02/17/2021 gi bleed / sob.  imaging showed  Enlarged mesenteric nodes.  No hx of malignancy   no fevers  occ night sweats      Past Medical History:     Past Medical History:   Diagnosis Date   . Acute CHF     NOV  & DEC 2018, - DIALYSIS CATH PLACED Aug 24 2017 TO REMOVE FLUID    . Acute systolic (congestive) heart failure 08/2017   . Anxiety    . CAD (coronary artery disease)    . Cardiomyopathy     nonischemic   . CHF (congestive heart failure) 08/12/2014, 2013   . Chronic obstructive pulmonary disease     POSSIBLE PER PT HE  USES  SYNBICORT BID ABD  BREO INHALER PRN   . Coronary artery disease 2011    CORONARY STENT PLACEMENT FOLLOWED BY [facility]   . Depression    . echocardiogram 02/2016, 11/2016, 09/2017, 12/20/2017   . GERD (gastroesophageal reflux disease)    . Heart attack 2011    and 2014   . Hyperlipemia    . Hypertension    . ICD (implantable 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     PLACED 2014  [facility]  LAST INTERROGATION April 01 2018 REPORT REQUESTED   . Ischemic cardiomyopathy     EF 15% ON ECHO 09-20-2017 DR GARG IN EPIC   . Nonischemic cardiomyopathy    . NSTEMI (non-ST elevated myocardial infarction)    . 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   . Pacemaker 2014    MEDTRONIC ICD/PACEMAKER COMBO LAST INTERROGATION  12-12 2018   . Pneumonia 09/2016   . Primary cardiomyopathy     Ischemic cardiomyopathy EF 15% ON ECHO 09-20-2017 DR GARG IN EPIC   . Sepsis 08/2017   . Syncope and collapse     PRIOR TO ICD/PACEMAKER PLACED 2014   . Type 2 diabetes mellitus, controlled DX 1998    BS  AVG  100  A1C 7.4 Dec 27 2017   . Wheeze     PT SEE HIS PMD April 01 2018 RE THIS-TOLD TO SEE PMD BY [facility] JUNE 12 WHEN THEY SAW HIM FOR A CL       Past Surgical History:     Past Surgical History:   Procedure Laterality Date   . BIV  11/10/2016    ICD METRONIC  UPGRADE    . CARDIAC CATHETERIZATION  03/2010    CORONARY STENT PLACEMENT X 2 PER PT; LM normal, LAD 70-80% distal lesion at apex, 20% lesion mid CFX   . CARDIAC CATHETERIZATION  12/2012   . CARDIAC DEFIBRILLATOR PLACEMENT  12/2012    [facility]   . CARDIAC PACEMAKER PLACEMENT  2014    MEDTRONIC ICD/PACEMAKER PLACED [facility]   . CIRCUMCISION  AGE 72   . COLONOSCOPY  2009   . CORRECTION HAMMER TOE     . duodenal ulcer  1973    STRESS RELATED IN SCHOOL   . ECHOCARDIOGRAM, TRANSTHORACIC  02/2016 11/2016 09/2017 12/2017   . ECHOCARDIOGRAM, TRANSTHORACIC  02/2016,11/2016,12/20/2017   . EGD N/A 08/15/2014    Procedure: EGD;  Surgeon: Colon Branch, MD;  Location: [facility];  Service: Gastroenterology;   Laterality: N/A;  egd w/ bx   . EGD  1975   . EGD, COLONOSCOPY N/A 04/04/2018    Procedure: EGD with bxs, COLONOSCOPY with polypectomy and clipping;  Surgeon: Doyne Keel, MD;  Location: [facility];  Service: Gastroenterology;  Laterality: N/A;   . FRACTURE SURGERY  AGE 46     RT  ANKLE- CAST APPLIED   . HERNIA REPAIR  AGE 72    LEFT INGUINAL HERNIA   . MPI nuclear study  06/2016   . orthopedic surgery  AGE 26    RT foot corrective - HAMMERTOE   . TONSILLECTOMY AND ADENOIDECTOMY  1954       Family History:     Family History   Problem Relation Age of Onset   . Breast cancer Mother    . Heart attack Mother 20   . Hypertension Mother    . Diabetes Mother    . Coronary artery disease Mother    . Diabetes Brother    . Heart attack Father    . Hypertension Father        Social History:     Social History     Socioeconomic History   . Marital status: Married     Spouse name: Lajoyce Corners   . Number of children: 0   . Years of education: Not on file   . Highest education level: Not on file   Occupational History   . 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 Smoking status: Never Smoker   . Smokeless tobacco: Never Used   Vaping Use   . Vaping Use: Never used   Substance and Sexual Activity   . Alcohol use: No     Alcohol/week: 0.0 standard drinks     Comment: occasionally   . Drug use: No   . Sexual activity: Yes     Partners: Female   Other Topics Concern   . Not on file   Social History Narrative   . Not on file     Social Determinants of Health     Financial Resource Strain: Not on file   Food Insecurity: Not on file   Transportation Needs: Not on  file   Physical Activity: Not on file   Stress: Not on file   Social Connections: Not on file   Intimate Partner Violence: Not on file   Housing Stability: Not on file       Allergies:   No Known Allergies    Medications:     Current Facility-Administered Medications   Medication Dose Route Frequency   . aspirin  81 mg Oral Daily   . atorvastatin  40  mg Oral QHS   . carvedilol  25 mg Oral Q12H SCH   . docusate sodium  100 mg Oral BID   . empagliflozin  10 mg Oral Daily   . fluticasone furoate-vilanterol  1 puff Inhalation QAM   . insulin lispro  1-3 Units Subcutaneous QHS   . insulin lispro  1-5 Units Subcutaneous TID AC   . niacin  500 mg Oral BID   . PARoxetine  20 mg Oral Daily   . sacubitril-valsartan  1 tablet Oral BID   . SITagliptin  50 mg Oral Daily   . spironolactone  12.5 mg Oral Daily   . torsemide  40 mg Oral Daily       Review of Systems:   A complete twelve system review was done and was negative except for as documented in the history of present illness.      Physical Exam:     Vitals:    02/18/21 1616   BP: (!) 82/58   Pulse: 70   Resp: 19   Temp: (!) 96.9 F (36.1 C)   SpO2: 94%       General condition: Good  HEENT:   Opx clear.   No mucositis.   Lymph nodes: No significant lymphadenopathy  Chest: Clear to auscultation  Heart: Regular rhythm S4 . syst mm  Abdomen: No organomegaly, mass, distention or tenderness  Extremities: No edema  Skin: No rash  Neurologic: Alert, oriented, strength grossly normal    Labs:     Recent Labs   Lab 02/18/21  0556 02/17/21  1539 02/17/21  0953 02/17/21  0326   WBC 16.13*  --   --  15.31*   Hgb 12.5 12.6 12.6 12.3*   Hematocrit 38.5 38.7 39.1 38.1   Platelets 176  --   --  154     Results     Procedure Component Value Units Date/Time    Glucose Whole Blood - POCT [[id number]]  (Abnormal) Collected: 02/18/21 1615     Updated: 02/18/21 1627     Whole Blood Glucose POCT 134 mg/dL     Glucose Whole Blood - POCT [[id number]]  (Abnormal) Collected: 02/18/21 1134     Updated: 02/18/21 1143     Whole Blood Glucose POCT 299 mg/dL     Glucose Whole Blood - POCT [[id number]]  (Abnormal) Collected: 02/18/21 0805     Updated: 02/18/21 0814     Whole Blood Glucose POCT 194 mg/dL     CBC and differential [[id number]]  (Abnormal) Collected: 02/18/21 0556    Specimen: Blood Updated: 02/18/21 0703     WBC 16.13 x10 3/uL      Hgb 12.5  g/dL      Hematocrit 62.9 %      Platelets 176 x10 3/uL      RBC 3.94 x10 6/uL      MCV 97.7 fL      MCH 31.7 pg      MCHC 32.5 g/dL      RDW 14 %  MPV 12.0 fL      Nucleated RBC 0.0 /100 WBC      Absolute NRBC 0.00 x10 3/uL     Manual Differential [[id number]]  (Abnormal) Collected: 02/18/21 0556     Updated: 02/18/21 0703     Segmented Neutrophils 22 %      Band Neutrophils 0 %      Lymphocytes Manual 66 %      Monocytes Manual 7 %      Eosinophils Manual 0 %      Basophils Manual 0 %      Atypical Lymphocytes % 5 %      Neutrophils Absolute Manual 3.55 x10 3/uL      Band Neutrophils Absolute 0.00 x10 3/uL      Lymphocytes Absolute Manual 10.65 x10 3/uL      Monocytes Absolute 1.13 x10 3/uL      Eosinophils Absolute Manual 0.00 x10 3/uL      Basophils Absolute Manual 0.00 x10 3/uL      Atypical Lymphocytes Absolute 0.81 x10 3/uL     Cell MorpHology [[id number]]  (Abnormal) Collected: 02/18/21 0556     Updated: 02/18/21 0703     Cell Morphology Normal     Platelet Estimate Normal     Smudge Cells Present    GFR [[id number]] Collected: 02/18/21 0556     Updated: 02/18/21 0634     EGFR 51.4    Magnesium [[id number]] Collected: 02/18/21 0556    Specimen: Blood Updated: 02/18/21 0634     Magnesium 2.2 mg/dL     Comprehensive metabolic panel [[id number]]  (Abnormal) Collected: 02/18/21 0556    Specimen: Blood Updated: 02/18/21 0634     Glucose 209 mg/dL      BUN 24.4 mg/dL      Creatinine 1.6 mg/dL      Sodium 010 mEq/L      Potassium 4.3 mEq/L      Chloride 101 mEq/L      CO2 22 mEq/L      Calcium 8.8 mg/dL      Protein, Total 6.2 g/dL      Albumin 3.1 g/dL      AST (SGOT) 15 U/L      ALT 22 U/L      Alkaline Phosphatase 62 U/L      Bilirubin, Total 0.9 mg/dL      Globulin 3.1 g/dL      Albumin/Globulin Ratio 1.0     Anion Gap 13.0    Glucose Whole Blood - POCT [[id number]]  (Abnormal) Collected: 02/17/21 2015     Updated: 02/17/21 2020     Whole Blood Glucose POCT 227 mg/dL             Rads:     Radiology Results (24  Hour)     ** No results found for the last 24 hours. **            Marvell Fuller, MD, MD  [facility]  [address]  [place], [place] [address]

## 2021-02-18 NOTE — Plan of Care (Signed)
Problem: Safety  Goal: Patient will be free from injury during hospitalization  02/18/2021 0025 by Clotilde Dieter, RN  Outcome: Progressing  Flowsheets (Taken 02/18/2021 0025)  Patient will be free from injury during hospitalization:   Provide alternative method of communication if needed (communication boards, writing)   Assess for patients risk for elopement and implement Elopement Risk Plan per policy   Include patient/ family/ care giver in decisions related to safety   Ensure appropriate safety devices are available at the bedside   Provide and maintain safe environment   Assess patient's risk for falls and implement fall prevention plan of care per policy   Use appropriate transfer methods   Hourly rounding  02/18/2021 0025 by Clotilde Dieter, RN  Outcome: Progressing     Problem: Pain  Goal: Pain at adequate level as identified by patient  02/18/2021 0025 by Clotilde Dieter, RN  Outcome: Progressing  Flowsheets (Taken 02/18/2021 0025)  Pain at adequate level as identified by patient:   Include patient/patient care companion in decisions related to pain management as needed   Offer non-pharmacological pain management interventions   Evaluate if patient comfort function goal is met   Reassess pain within 30-60 minutes of any procedure/intervention, per Pain Assessment, Intervention, Reassessment (AIR) Cycle   Consult/collaborate with Pain Service   Evaluate patient's satisfaction with pain management progress   Assess pain on admission, during daily assessment and/or before any "as needed" intervention(s)   Assess for risk of opioid induced respiratory depression, including snoring/sleep apnea. Alert healthcare team of risk factors identified.   Identify patient comfort function goal  02/18/2021 0025 by Clotilde Dieter, RN  Outcome: Progressing     Problem: Moderate/High Fall Risk Score >5  Goal: Patient will remain free of falls  Outcome: Progressing     Problem: Altered GI Function  Goal: Fluid and electrolyte balance are  achieved/maintained  02/18/2021 0025 by Clotilde Dieter, RN  Outcome: Progressing  Flowsheets (Taken 02/18/2021 0025)  Fluid and electrolyte balance are achieved/maintained:   Monitor intake and output every shift   Monitor/assess lab values and report abnormal values   Assess for confusion/personality changes   Monitor daily weight   Observe for cardiac arrhythmias   Monitor for muscle weakness   Observe for seizure activity and initiate seizure precautions if indicated   Assess and reassess fluid and electrolyte status   Provide adequate hydration  02/18/2021 0025 by Clotilde Dieter, RN  Outcome: Progressing  Goal: Elimination patterns are normal or improving  02/18/2021 0025 by Clotilde Dieter, RN  Outcome: Progressing  Flowsheets (Taken 02/18/2021 0025)  Elimination patterns are normal or improving:   Report abnormal assessment to physician   Anticipate/assist with toileting needs   Assess for normal bowel sounds   Monitor for abdominal distension   Monitor for abdominal discomfort   Assess for signs and symptoms of bleeding.  Report signs of bleeding to physician   Administer treatments as ordered   Consult/collaborate with Clinical Nutritionist   Assess for flatus   Collaborate with LIP for containment device   Reinforce education on foods that improve and complicate bowel movements and how activity and medications can affect bowel movements   Administer medications to improve bowel evacuation as prescribed   Encourage /perform oral hygiene as appropriate  02/18/2021 0025 by Clotilde Dieter, RN  Outcome: Progressing  Goal: No bleeding  Outcome: Progressing     Problem: Everyday - Heart Failure  Goal: Stable Vital Signs and Fluid Balance  Outcome: Progressing  Flowsheets (Taken 02/18/2021 0025)  Stable Vital Signs and Fluid Balance:   Fluid Restriction   Strict Intake/Output   Monitor, assess vital signs and telemetry per policy   Daily Standing Weights in the morning using the same scale, after using the bathroom and before  breadfast.  If unable to stand, zero the bed and use the bed scale   Monitor labs and report abnormalities to physician   Assess for swelling/edema   Wean oxygen as needed if appropriate  Goal: Mobility/Activity is Maintained at Optimal Level for Patient  Outcome: Progressing  Flowsheets (Taken 02/18/2021 0025)  Mobility/Activity is Maintained at Optimal Level for Patient:   Assess for changes in respiratory status, level of consciousness and/or development of fatigue   Perform active/passive ROM   Increase mobility as tolerated/progressive mobility protocol   Maintain SCD's as Ordered   Reposition patient every 2 hours and as needed unless able to reposition self  Goal: Nutritional Intake is Adequate  Outcome: Progressing  Flowsheets (Taken 02/18/2021 0025)  Nutritional Intake is Adequate:   Encourage/perform oral hygiene as appropriate   Patient and family teaching on low sodium diet   Cardiac diet-2 gm Sodium   Fluid Restricction if needed   Assess appetite,anorexia and amount of meal/food tolerated   Consult/Collaborate with Nutritionist

## 2021-02-18 NOTE — UM Notes (Signed)
Hello    SUB# [id number]  Admission clinicals for 02/17/21-02/18/21  C/b to [name] [phone]    Thank-you!      DIAGNOSIS    ICD-10-CM    1. LGI bleed  K92.2    2. Cough  R05.9    3. 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 SOB (shortness of breath)  R06.02    5. Bronchospasm  J98.01    6. Type 2 diabetes mellitus with complication, with long-term current use of insulin  E11.8     Z79.4    7. Coronary artery disease involving coronary bypass graft of native heart with unstable angina pectoris  I25.700    8. Acute on chronic systolic CHF (congestive heart failure)  I50.23    9. Cardiomyopathy, unspecified type  I42.9 LO Internal Referral to Cardiac Rehabilitation   10. COVID-19 ruled out by laboratory testing  Z20.822    11. Acute on chronic renal insufficiency  N28.9     N18.9    12. Elevated brain natriuretic peptide (BNP) level  R79.89          ADMIT DATE: 02/17/2021 at 0302       Reason For Hospitalization:    Pt is a 75 y.o. male who arrived to the ER with C/ORectal Bleeding    Pt reports being sick over the weekend, prior to calling EMS he went to [facility] and noted " a lot" of bright red blood in the toilet. pt is also c/o lower abd pain takes aspirin daily no blood thinners.    On exam : acute distress, speaking in 2-3 word sentences w/ audible wheezing, blood smear noted in underware    PMH:  has a past medical history of Acute CHF, Acute systolic (congestive) heart failure (08/2017), Anxiety, CAD (coronary artery disease), Cardiomyopathy, CHF (congestive heart failure) (08/12/2014, 2013), Chronic obstructive pulmonary disease, Coronary artery disease (2011), Depression, echocardiogram (02/2016, 11/2016, 09/2017, 12/20/2017), GERD (gastroesophageal reflux disease), Heart attack (2011), Hyperlipemia, Hypertension, ICD (implantable 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, Ischemic cardiomyopathy, Nonischemic cardiomyopathy, NSTEMI (non-ST elevated myocardial infarction),  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    Height : 6'0" / Weight : 206 lbs    Vitals:  96.7-96.9, HR 77, RR 22-24, BP 125/82, desats to 86% RA    EKG: Atrial-sensed ventricular-paced rhythm    ED Abnormal Labs:  WBC 15.31, Hgb 12.3, RBC 3.89, PTINR 13.6/1.2, Glucose 129, BUN 32.8, Cr 1.7, CO2 20, ALB 3.3, BNP 1354.5    COVID 19 not detected    Urine: glucose > 500, small blood    Radiologic Exams:   XR Chest  AP Portable    Result Date: 02/17/2021   No acute process. Demetrios Isaacs, MD  02/17/2021 3:58 AM    CT Abd/Pelvis with IV and PO Contrast    Result Date: 02/17/2021  1.  Bilateral airspace opacities suggestive of bronchiolitis. 2.  No acute inflammatory process involving the abdomen or pelvis. 3.  Mesenteric, porta hepatis, and inguinal lymphadenopathy of uncertain etiology. Correlate with clinical history. 4.  Multiple renal cysts and subcentimeter hypodensities that are too small to characterize. These can be better evaluated with MRI on a nonemergent basis as clinically indicated. Overall these are similar to exam from 08/22/2017 Theador Hawthorne, MD  02/17/2021 4:49 AM         ED meds: 2.5mg  ALB  neb, .5in top Nitro, IVF bolus , 4mg  IV Morphine, 4mg  IV Zofran, 40mg  IV Lasix, O2 2L NC      Admit to Inpatient on 02/17/21 0505 : CHF exac with resp insuff given desats to 86% RA, tachypnea, wheezing, speaking in short sentences / Rectal bleed/LGIB      MD H&P :  Assessment/Plan:   1.  CHF exacerbation with nonischemic cardiomyopathy and s/p AICD: Continue diuresis with Lasix IV, strict I's and O's, monitor on telemetry, follow cardiac enzymes, continue Coreg, Entresto, spironolactone, consult cardiology ([facility])  2.  Lower GI bleed: Keep n.p.o., monitor H&H every 6 hours, hold aspirin, transfuse packed red blood cells if needed, consult GI in a.m.  3.  Acute on chronic kidney  disease, stage III: Prerenal most likely, continue diuresis with Lasix IV, repeat labs in a.m.  4.  CAD: Continue Lipitor, Coreg, hold aspirin, monitor on telemetry  5.  COPD: Continue Breo Ellipta and DuoNeb treatment as needed  6.  Diabetes mellitus, type II: Continue Jardiance, Januvia and insulin/with Humalog  7.  Hypertension: Continue Coreg  8.  Hyperlipidemia: Continue Lipitor and niacin  9.  Leukocytosis: Reactive most likely, repeat labs in a.m.  10.  GERD: Stable  11.  Depression and anxiety: Continue Paxil  DVT Prohylaxis:SEDs       Treatment Plan:     Tele, diet as tol, prn Tylenol, prn DuoNeb, prn Xanax, prn Robitussin, prn IV Zofran, GI consult, I/O, supp O2, VS q4, RT, Cardio, Hematology    Scheduled Meds:  Current Facility-Administered Medications   Medication Dose Route Frequency   . aspirin  81 mg Oral Daily   . atorvastatin  40 mg Oral QHS   . carvedilol  25 mg Oral Q12H SCH   . docusate sodium  100 mg Oral BID   . empagliflozin  10 mg Oral Daily   . fluticasone furoate-vilanterol  1 puff Inhalation QAM   . insulin lispro  1-3 Units Subcutaneous QHS   . insulin lispro  1-5 Units Subcutaneous TID AC   . niacin  500 mg Oral BID   . PARoxetine  20 mg Oral Daily   . sacubitril-valsartan  1 tablet Oral BID   . SITagliptin  50 mg Oral Daily   . spironolactone  12.5 mg Oral Daily   . torsemide  40 mg Oral Daily     Cardio consult 02/17/21 :  Assessment:    Acute on chronic systolic congestive heart failure.   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  He will resume 40mg  in the morning upon d/c      GI consult 02/17/21 :  # abdominal  pain -mid lower -  #Rectal  bleed-Suspect lower GI source. DDx for etiology include diverticular bleeding, angiodysplastic lesions, neoplasia, hemorrhoidal bleeding, IBD and ischemic colitis.   #history of Esophagitis - EGD [email or web address]-[email or web address]  #external hemorrhoid-non bleeding-  noted on exam  #CHF/CAD  ,s/p AICD: [facility]   #HTN  #history of inguinal hernia  Recommendations:   -Measures for acute GI bleeding: n.p.o., IV fluids, 2 large-bore IV access, active type and cross,  transfuse as needed for  hemoglobin > 7 - 8.   -Please give FFP to reverse coagulopathy with a goal INR less than 1.5 in the setting of GI bleeding.  --CBC Q 8 if active bleed , daily if no active bleed  --trend HH , transfuse if Hgb < 8   ---Clear liquid diet today-   --cardiology consult   --HH stable and no sign of active bleeding since admission -rectal exam negative for blood-   -- we will cont to monitor pt  and re evaluate tomorrow for indication of colonoscopy inpatient vs outpatient           =======Day 2 02/18/21 on TELE =========    Vitals:   Temp:  [96.9 F (36.1 C)-97.4 F (36.3 C)]   Heart Rate:  [65-74]   Resp Rate:  [16-20]   BP: (82-111)/(57-80)   SpO2:  [94 %-98 %]   Weight:  [93.5 kg (206 lb 3.2 oz)]     T 96.9  BPs to 90/57 - 82/58  sats 96% O2 2L NC    Abnormal Labs:    Glucose 299, WBC 16.13, RBC 3.94, BUN 31, Cr 1.6    IV PRN MEDS GIVEN:      Testing/Procedures:      Continue Plan of Care:      Hematology consult 02/18/21 :  History:   [name] [name] is a 75 y.o. male who presents to the hospital on 02/17/2021 gi bleed / sob.  imaging showed  Enlarged mesenteric nodes.  No hx of malignancy   no fevers  occ night sweats  Recommendations:   Enlarged abd nodes   D/w patient and wife. Cannot rule out underlying lymphoproliferative disorder.  Will follow as out patient  Thanks       Scheduled Meds:  Current Facility-Administered Medications   Medication Dose Route Frequency   . aspirin  81 mg Oral Daily   . atorvastatin  40 mg Oral QHS   . carvedilol  25 mg  Oral Q12H SCH   . docusate sodium  100 mg Oral BID   . empagliflozin  10 mg Oral Daily   . fluticasone furoate-vilanterol  1 puff Inhalation QAM   . insulin lispro  1-3 Units Subcutaneous QHS   . insulin lispro  1-5 Units Subcutaneous TID AC   . niacin  500 mg Oral BID   . PARoxetine  20 mg Oral Daily   . sacubitril-valsartan  1 tablet Oral BID   . SITagliptin  50 mg Oral Daily   . spironolactone  12.5 mg Oral Daily   . torsemide  40 mg Oral Daily

## 2021-02-18 NOTE — Progress Notes (Signed)
[name] HOSPITAL[facility]  Progress Note  Patient Info:   Date/Time: 02/18/2021 / 3:50 PM   Admit Date:02/17/2021  Patient Name:[name] [name]   ZOX:[id number]   PCP: Zorita Pang, MD  Attending Physician:Velencia Lenart, Remonia Richter, MD     Assessment and Plan:     75 year old male with chronic systolic CHF, nonischemic cardiomyopathy s/p AICD, chronic kidney disease stage III, coronary artery disease, COPD, type 2 diabetes, hypertension, hyperlipidemia presented with rectal bleeding and admitted with acute on chronic systolic CHF and evaluation of rectal bleeding.    Rectal bleeding and abdominal pain  Suspect diverticular versus hemorrhoidal bleeding.  No further rectal bleeding.  Hemoglobin stable.  CT of the abdomen on admission no acute inflammatory process.  Mesenteric, inguinal lymphadenopathy of uncertain etiology.  Multiple renal cysts and subcentimeter hypodensities too small to characterize.    Hemoglobin stable.  No further rectal bleeding.  Appreciated GI input.  Given stable hemoglobin recommended outpatient colonoscopy.  Discussed with patient in length he is agreeable.  Advance diet to heart healthy diet today.  Need follow-up imaging to monitor lymphadenopathy.     Acute on chronic systolic CHF  Nonischemic cardiomyopathy with EF of 25% s/p AICD  BNP greater than 1300.   Appreciated cardiology input.    Weights trending down.  Volume status improved.  Switched to p.o. torsemide 40 mg daily.  Continue daily weights, intake and output charting.    Acute on chronic kidney disease stage III  Creatinine slightly improved to 1.6 today BMP in a.m.    History of nonocclusive coronary artery disease  Continue on Coreg, Lipitor.  Will restart aspirin given no further rectal bleeding   Stable otherwise.    Known COPD with no acute signs of exacerbation.  Continue on Breo and duo nebs as needed    Type 2 diabetes continue on Jardiance, Januvia and sliding scale insulin    Hypertension continue on  Coreg    Hyperlipidemia continue Lipitor    Anxiety and depression continue on Paxil    Plan discussed in detail with patient  and answered all the questions.    DVT Prohylaxis: SCDs  Central Line/Foley Catheter/PICC line status: None  Code Status: Full Code  Disposition:home  Type of Admission:Inpatient  Expected Date of Discharge: 24 hours  Milestones required for discharge: As stated above  Hospital Problems:   Principal Problem:    Acute on chronic systolic CHF (congestive heart failure)  Active Problems:    Type 2 diabetes mellitus with complication, with long-term current use of insulin    CAD (coronary artery disease)    Non-ischemic cardiomyopathy    Essential hypertension    AICD (automatic cardioverter/defibrillator) present    Acute renal failure superimposed on stage 3 chronic kidney disease    GERD (gastroesophageal reflux disease)    Leukocytosis    Lower GI bleed    Anxiety and depression    COPD (chronic obstructive pulmonary disease)    HLD (hyperlipidemia)    Subjective:   02/18/21   Seen and examined this morning  Breathing is better than yesterday.    No further rectal bleeding.    Denied any abdominal pain.    Denied any chest pain or shortness of breath today  Chief Complaint:  Rectal Bleeding    ROS  Objective:     Vitals:    02/18/21 0807 02/18/21 0908 02/18/21 1043 02/18/21 1136   BP: 109/67 105/73  90/57   Pulse: 66 68 72 74   Resp: 17  16 17   Temp: 97.3 F (36.3 C)   97.4 F (36.3 C)   TempSrc: Temporal   Temporal   SpO2: 98%   96%   Weight:       Height:         Physical Exam:   Physical Exam   Comfortable in bed in no distress  CVS S1-S2 regular  Respiratory good air entry bilaterally  Abdomen soft nontender  Extremities no edema  Neuro awake alert oriented x3  Results of Labs/imaging   Labs and radiology reports have been reviewed.    Hospitalist   Signed by:   Haze Boyden, MD  02/18/2021 3:50 PM    *This note was generated by the Epic EMR system/ Dragon speech recognition and  may contain inherent errors or omissions not intended by the user. Grammatical errors, random word insertions, deletions, pronoun errors and incomplete sentences are occasional consequences of this technology due to software limitations. Not all errors are caught or corrected. If there are questions or concerns about the content of this note or information contained within the body of this dictation they should be addressed directly with the author for clarification

## 2021-02-18 NOTE — Progress Notes (Signed)
[facility]  PROGRESS NOTE  [facility]    Date Time: 02/18/21 8:50 AM  Patient Name: [name] [name], [name] [name]         Assessment:    Acute on chronic systolic congestive heart failure.   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 atorvastatin  40 mg Oral QHS   . carvedilol  25 mg Oral Q12H SCH   . docusate sodium  100 mg Oral BID   . empagliflozin  10 mg Oral Daily   . fluticasone furoate-vilanterol  1 puff Inhalation QAM   . furosemide  80 mg Intravenous BID   . insulin lispro  1-3 Units Subcutaneous QHS   . insulin lispro  1-5 Units Subcutaneous TID AC   . niacin  500 mg Oral BID   . PARoxetine  20 mg Oral Daily   . sacubitril-valsartan  1 tablet Oral BID   . SITagliptin  50 mg Oral Daily   . spironolactone  12.5 mg Oral Daily         Subjective:   Denies chest pain, SOB or palpitations.      Physical Exam:     Vitals:    02/18/21 0807   BP: 109/67   Pulse: 66   Resp: 17   Temp: 97.3 F (36.3 C)   SpO2: 98%     Temp (24hrs), Avg:97.2 F (36.2 C), Min:96.9 F (36.1 C), Max:97.8 F (36.6 C)    Weight Monitoring 09/26/2020 11/22/2020 01/13/2021 01/29/2021 02/17/2021 02/17/2021 02/18/2021   Height 182.9 cm 182.9 cm 172.7 cm 182.9 cm 182.9 cm 182.9 cm -   Height Method - - - - Stated Stated -   Weight 96.616 kg 96.163 kg 102.059 kg 96.979 kg 89.359 kg 94.938 kg 93.532 kg   Weight Method - - - - Stated Bed Scale Standing Scale   BMI (calculated) 28.9 kg/m2 28.8 kg/m2 34.3 kg/m2 29.1 kg/m2 26.8 kg/m2 28.4 kg/m2 -         Telemetry reviewed:vpaced     Intake and Output Summary (Last 24 hours) at Date  Time    Intake/Output Summary (Last 24 hours) at 02/18/2021 0850  Last data filed at 02/18/2021 0400  Gross per 24 hour   Intake 500 ml   Output 1205 ml   Net -705 ml       General Appearance:  Breathing comfortable, no acute distress  Neck:  No carotid bruit or jugular venous distension, brisk carotid upstroke  Lungs:  Clear to auscultation throughout, normal respiratory effort   Heart:  S1, S2 normal, no S3, no S4, no murmur, PMI not displaced, no rub   Abdomen:  Soft, non-tender, no bruits, no increased abdominal aorta  Extremities:  No cyanosis, clubbing or edema  Neurologic:  Alert and oriented x3,     Labs:     Recent Labs   Lab 02/17/21  1539 02/17/21  0953 02/17/21  0326   Creatine Kinase (CK)  --  152  --    Troponin I 0.03 0.03  0.05         Recent Labs   Lab 02/18/21  0556   ALT 22   AST (SGOT) 15     Recent Labs   Lab 02/18/21  0556   Magnesium 2.2     Recent Labs   Lab 02/17/21  0326   PT INR 1.2*   PTT 34     Recent Labs   Lab 02/18/21  0556 02/17/21  1539 02/17/21  0953 02/17/21  0326   WBC 16.13*  --   --  15.31*   Hgb 12.5 12.6 12.6 12.3*   Hematocrit 38.5 38.7 39.1 38.1   Platelets 176  --   --  154     Recent Labs   Lab 02/18/21  0556 02/17/21  0326   Sodium 136 141   Potassium 4.3 4.6   Chloride 101 107   CO2 22 20*   BUN 31.0* 32.8*   Creatinine 1.6* 1.7*   EGFR 51.4 47.9   Glucose 209* 129*     Estimated Creatinine Clearance: 48.1 mL/min (A) (based on SCr of 1.6 mg/dL (H)).  .  Lab Results   Component Value Date    BNP 1,354.5 (H) 02/17/2021              Signed by: Marian Sorrow, MD, M.D., [facility]      [facility]  NP Spectralink [phone] (8am-5pm)  MD Spectralink [phone] (8am-5pm)  After hours, non urgent consult line [phone]  After Hours, urgent consults [phone]

## 2021-02-18 NOTE — Plan of Care (Signed)
Problem: Safety  Goal: Patient will be free from injury during hospitalization  Outcome: Progressing  Flowsheets (Taken 02/18/2021 1354)  Patient will be free from injury during hospitalization:   Provide and maintain safe environment   Assess patient's risk for falls and implement fall prevention plan of care per policy   Use appropriate transfer methods   Ensure appropriate safety devices are available at the bedside   Include patient/ family/ care giver in decisions related to safety   Hourly rounding   Assess for patients risk for elopement and implement Elopement Risk Plan per policy   Provide alternative method of communication if needed (communication boards, writing)  Goal: Patient will be free from infection during hospitalization  Outcome: Progressing     Problem: Moderate/High Fall Risk Score >5  Goal: Patient will remain free of falls  Outcome: Progressing  Flowsheets (Taken 02/18/2021 1354)  High (Greater than 13):   HIGH-Apply yellow "Fall Risk" arm band   MOD-Utilize diversion activities   MOD-Place Fall Risk level on whiteboard in room  [facility] Moderate Risk (6-13): Use of floor mat  [facility] High Risk (Greater than 13): Use of floor mat     Problem: Altered GI Function  Goal: Fluid and electrolyte balance are achieved/maintained  Outcome: Progressing  Flowsheets (Taken 02/18/2021 1354)  Fluid and electrolyte balance are achieved/maintained:   Monitor intake and output every shift   Monitor/assess lab values and report abnormal values   Provide adequate hydration   Assess for confusion/personality changes   Monitor daily weight   Assess and reassess fluid and electrolyte status   Observe for seizure activity and initiate seizure precautions if indicated   Observe for cardiac arrhythmias   Monitor for muscle weakness  Goal: Elimination patterns are normal or improving  Outcome: Progressing  Flowsheets (Taken 02/18/2021 1354)  Elimination patterns are normal or improving:   Report abnormal assessment to physician    Anticipate/assist with toileting needs   Assess for normal bowel sounds   Monitor for abdominal distension   Monitor for abdominal discomfort   Assess for signs and symptoms of bleeding.  Report signs of bleeding to physician   Administer treatments as ordered   Consult/collaborate with Clinical Nutritionist   Assess for flatus   Collaborate with LIP for containment device   Assess for and discuss C. diff screening with LIP   Reinforce education on foods that improve and complicate bowel movements and how activity and medications can affect bowel movements   Administer medications to improve bowel evacuation as prescribed   Encourage /perform oral hygiene as appropriate  Goal: Nutritional intake is adequate  Outcome: Progressing  Flowsheets (Taken 02/18/2021 1354)  Nutritional intake is adequate:   Monitor daily weights   Assist patient with meals/food selection   Encourage/perform oral hygiene as appropriate  Goal: Mobility/Activity is maintained at optimal level for patient  Outcome: Progressing  Goal: No bleeding  Outcome: Progressing  Flowsheets (Taken 02/18/2021 1354)  No bleeding:   Monitor and assess vitals and hemodynamic parameters   Monitor/assess lab values and report abnormal values   Assess for bruising/petechia     Problem: Altered GI Function  Goal: Nutritional intake is adequate  Outcome: Progressing  Flowsheets (Taken 02/18/2021 1354)  Nutritional intake is adequate:   Monitor daily weights   Assist patient with meals/food selection   Encourage/perform oral hygiene as appropriate     Problem: Altered GI Function  Goal: No bleeding  Outcome: Progressing  Flowsheets (Taken 02/18/2021 1354)  No  bleeding:   Monitor and assess vitals and hemodynamic parameters   Monitor/assess lab values and report abnormal values   Assess for bruising/petechia     Problem: Day of Admission - Heart Failure  Goal: Heart Failure Admission  Outcome: Progressing  Flowsheets (Taken 02/18/2021 1354)  Heart Failure Admission:   Standing  Weight on admission, if unable to stand zero the bed and use the bed scale   Vital signs and telemetry per policy   Strict Intake/Output   Fluid restriction   Oxygen as needed   Assess for swelling/edema and document   Initiate education with patient and caregiver using CHF Warning Zones and Educational Videos ([employer] or [employer])     Problem: Everyday - Heart Failure  Goal: Stable Vital Signs and Fluid Balance  Outcome: Progressing  Flowsheets (Taken 02/18/2021 1354)  Stable Vital Signs and Fluid Balance:   Daily Standing Weights in the morning using the same scale, after using the bathroom and before breadfast.  If unable to stand, zero the bed and use the bed scale   Monitor, assess vital signs and telemetry per policy   Monitor labs and report abnormalities to physician   Strict Intake/Output   Fluid Restriction   Assess for swelling/edema   Wean oxygen as needed if appropriate  Goal: Mobility/Activity is Maintained at Optimal Level for Patient  Outcome: Progressing  Flowsheets (Taken 02/18/2021 1354)  Mobility/Activity is Maintained at Optimal Level for Patient:   Increase mobility as tolerated/progressive mobility protocol   Maintain SCD's as Ordered   Perform active/passive ROM   Reposition patient every 2 hours and as needed unless able to reposition self   Assess for changes in respiratory status, level of consciousness and/or development of fatigue   Consult with Physical Therapy and/or Occupational Therapy  Goal: Nutritional Intake is Adequate  Outcome: Progressing  Flowsheets (Taken 02/18/2021 1354)  Nutritional Intake is Adequate:   Cardiac diet-2 gm Sodium   Fluid Restricction if needed   Consult/Collaborate with Nutritionist   Patient and family teaching on low sodium diet   Assess appetite,anorexia and amount of meal/food tolerated   Encourage/perform oral hygiene as appropriate  Goal: Teaching-Using CHF Warning Zones and Educational Videos  Outcome: Progressing  Flowsheets (Taken 02/18/2021  1354)  Teaching-Using CHF Warning Zones and Educational Videos:   Signs & Symptoms of CHF   Daily Standing Weights & record   CHF Warning Zones and when to call for help   Medications   Fluid Restriction if appropriate   Sodium Restriction   Document in the Education Tab in EPIC with Teach-back     Problem: Everyday - Heart Failure  Goal: Nutritional Intake is Adequate  Outcome: Progressing  Flowsheets (Taken 02/18/2021 1354)  Nutritional Intake is Adequate:   Cardiac diet-2 gm Sodium   Fluid Restricction if needed   Consult/Collaborate with Nutritionist   Patient and family teaching on low sodium diet   Assess appetite,anorexia and amount of meal/food tolerated   Encourage/perform oral hygiene as appropriate     Problem: Everyday - Heart Failure  Goal: Teaching-Using CHF Warning Zones and Educational Videos  Outcome: Progressing  Flowsheets (Taken 02/18/2021 1354)  Teaching-Using CHF Warning Zones and Educational Videos:   Signs & Symptoms of CHF   Daily Standing Weights & record   CHF Warning Zones and when to call for help   Medications   Fluid Restriction if appropriate   Sodium Restriction   Document in the Education Tab in EPIC with Teach-back

## 2021-02-18 NOTE — Progress Notes (Signed)
GASTROENTEROLOGY CONSULT FOLLOW-UP NOTE    Date Time:    02/18/21    11:34 AM  Patient Name: [name] [name]    LOS: 1 day     Assessment and Recommendations:   [name] [name] is a 75 y.o. male w/ PMHx of esophagitis, DM , CHF, CAD s/p stent 2017 on baby ASA, COPD, NSTEMI who presents to the hospital on 02/17/2021 with left lower abdominal pain and rectal bleeding x1 days.    5/2 CT finding -Bilateral airspace opacities suggestive of bronchiolitis.No acute inflammatory process involving the abdomen or pelvis. Mesenteric, portahepatis, and inguinal lymphadenopathy of uncertain etiology. Correlate with clinical history. Multiple renal cysts and subcentimeter hypodensities that are too  small to characterize. These can be better evaluated with MRI on a  nonemergent basis as clinically indicated. Overall these are similar to  exam from 08/22/2017      # abdominal  pain improved  #Rectal bleed- no episode since admission  #history of Esophagitis - EGD [email or web address]  #external hemorrhoid-non bleeding-  noted on exam  #CHF/CAD  ,s/p AICD: [facility]   #HTN  #history of inguinal hernia    Recommendations:   --pt HH stable with no active bleed and improved abd pain  --cont F/U with cardiology  --recommend pt to follow up with GI outpatient for screening colonoscopy   --no indication of any endoscopic evaluation at this time    GI will sign off. Please call if any active bleeding     Patient can call [facility] ([address], [address], [place], [place], [address] [phone]) to make an appointment for office visit for colonoscopy, and follow up    Subjective     Chief Complaint: Acute on chronic systolic CHF (congestive heart failure)    Interval History/Subjective: feeling great. Denies abd pain, N/V - no rectal bleed since admission      Review of Systems:   Review of Systems    Pertinent positives and negatives noted in Interval History/Subjective.    Physical Exam:     Patient  Vitals for the past 24 hrs:   BP Temp Temp src Pulse Resp SpO2 Weight   02/18/21 0908 105/73 -- -- 68 -- -- --   02/18/21 0807 109/67 97.3 F (36.3 C) Temporal 66 17 98 % --   02/18/21 0345 101/72 (!) 96.9 F (36.1 C) Temporal 65 18 98 % 93.5 kg (206 lb 3.2 oz)   02/17/21 2342 102/71 97 F (36.1 C) Temporal 74 19 95 % --   02/17/21 2102 105/80 -- -- 73 -- -- --   02/17/21 2013 111/73 97.1 F (36.2 C) Temporal 67 20 96 % --   02/17/21 1800 -- -- -- 70 -- -- --   02/17/21 1617 109/74 97 F (36.1 C) Temporal 61 20 96 % --   02/17/21 1234 -- -- -- 66 -- -- --   02/17/21 1201 114/80 97.8 F (36.6 C) Temporal 71 21 96 % --       General appearance - Well developed and well nourished, no acute distress.  Respiratory - Non labored respirations, no audible wheezing.  Cardiovascular - Regular rate and rhythm, no LE edema.  Gastrointestinal - Soft, non-tender, non-distended, no masses, normal bowel sounds.   Neurologic - Alert and oriented to person, place and time.  No gross movement disorders noted.  Skin: Normal color and turgor, no rashes, no suspicious skin lesions noted.    Meds:     Current Facility-Administered Medications  Medication Dose Route Frequency   . atorvastatin  40 mg Oral QHS   . carvedilol  25 mg Oral Q12H SCH   . docusate sodium  100 mg Oral BID   . empagliflozin  10 mg Oral Daily   . fluticasone furoate-vilanterol  1 puff Inhalation QAM   . insulin lispro  1-3 Units Subcutaneous QHS   . insulin lispro  1-5 Units Subcutaneous TID AC   . niacin  500 mg Oral BID   . PARoxetine  20 mg Oral Daily   . sacubitril-valsartan  1 tablet Oral BID   . SITagliptin  50 mg Oral Daily   . spironolactone  12.5 mg Oral Daily   . torsemide  40 mg Oral Daily     Continuous Infusions:  PRN Meds:.acetaminophen, albuterol-ipratropium, ALPRAZolam, Nursing communication: Adult Hypoglycemia Treatment Algorithm **AND** glucagon (rDNA) **AND** dextrose **AND** dextrose **AND** dextrose, ondansetron    Labs Reviewed:      Recent Labs   Lab 02/18/21  0556 02/17/21  1539 02/17/21  0953 02/17/21  0326   WBC 16.13*  --   --  15.31*   Hgb 12.5 12.6 12.6 12.3*   Hematocrit 38.5 38.7 39.1 38.1   Platelets 176  --   --  154     Recent Labs   Lab 02/17/21  0326   PT 13.6*   PT INR 1.2*   PTT 34     Recent Labs   Lab 02/18/21  0556 02/17/21  0326   Sodium 136 141   Potassium 4.3 4.6   Chloride 101 107   CO2 22 20*   BUN 31.0* 32.8*   Creatinine 1.6* 1.7*   Calcium 8.8 9.1   Albumin 3.1* 3.3*   Protein, Total 6.2 6.4   Bilirubin, Total 0.9 0.8   Alkaline Phosphatase 62 64   ALT 22 27   AST (SGOT) 15 22   Glucose 209* 129*   Lipase  --  36             Rads:   GI Radiological Procedures since admission  reviewed.   Radiology Results (24 Hour)     ** No results found for the last 24 hours. **              Signed by: Smith Robert, FNP

## 2021-02-19 DIAGNOSIS — Z95810 Presence of automatic (implantable) cardiac defibrillator: Secondary | ICD-10-CM

## 2021-02-19 DIAGNOSIS — N179 Acute kidney failure, unspecified: Secondary | ICD-10-CM

## 2021-02-19 DIAGNOSIS — I25810 Atherosclerosis of coronary artery bypass graft(s) without angina pectoris: Secondary | ICD-10-CM

## 2021-02-19 DIAGNOSIS — I5023 Acute on chronic systolic (congestive) heart failure: Secondary | ICD-10-CM

## 2021-02-19 DIAGNOSIS — E785 Hyperlipidemia, unspecified: Secondary | ICD-10-CM

## 2021-02-19 DIAGNOSIS — I5043 Acute on chronic combined systolic (congestive) and diastolic (congestive) heart failure: Secondary | ICD-10-CM

## 2021-02-19 DIAGNOSIS — N183 Chronic kidney disease, stage 3 unspecified: Secondary | ICD-10-CM

## 2021-02-19 DIAGNOSIS — K922 Gastrointestinal hemorrhage, unspecified: Secondary | ICD-10-CM

## 2021-02-19 DIAGNOSIS — I428 Other cardiomyopathies: Secondary | ICD-10-CM

## 2021-02-19 LAB — BASIC METABOLIC PANEL
Anion Gap: 13 (ref 5.0–15.0)
BUN: 35.2 mg/dL — ABNORMAL HIGH (ref 9.0–28.0)
CO2: 22 mEq/L (ref 22–29)
Calcium: 8.5 mg/dL (ref 7.9–10.2)
Chloride: 103 mEq/L (ref 100–111)
Creatinine: 1.8 mg/dL — ABNORMAL HIGH (ref 0.7–1.3)
Glucose: 213 mg/dL — ABNORMAL HIGH (ref 70–100)
Potassium: 4 mEq/L (ref 3.5–5.1)
Sodium: 138 mEq/L (ref 136–145)

## 2021-02-19 LAB — MAGNESIUM: Magnesium: 2 mg/dL (ref 1.6–2.6)

## 2021-02-19 LAB — GFR: EGFR: 44.8

## 2021-02-19 LAB — B-TYPE NATRIURETIC PEPTIDE: B-Natriuretic Peptide: 231.4 pg/mL — ABNORMAL HIGH (ref 0.0–100.0)

## 2021-02-19 LAB — GLUCOSE WHOLE BLOOD - POCT
Whole Blood Glucose POCT: 267 mg/dL — ABNORMAL HIGH (ref 70–100)
Whole Blood Glucose POCT: 190 mg/dL — ABNORMAL HIGH (ref 70–100)
Whole Blood Glucose POCT: 260 mg/dL — ABNORMAL HIGH (ref 70–100)

## 2021-02-19 MED ORDER — TORSEMIDE 20 MG PO TABS
40.0000 mg | ORAL_TABLET | Freq: Every morning | ORAL | 0 refills | Status: DC
Start: 2021-02-19 — End: 2021-11-12

## 2021-02-19 NOTE — Discharge Summary (Signed)
[name] HOSPITALIST   [place] Summary   Patient Info:   Date/Time: 02/19/2021 / 4:35 PM   Admit Date:02/17/2021  Patient Name:[name] [name]   ZOX:[id number]   PCP: Zorita Pang, MD  Attending Physician:Coriana Angello, Remonia Richter, MD     Hospital Course:   Please see H&P for complete details of HPI and ROS. The patient was admitted to [facility] and has been taken care as mentioned below.    75 year old male with chronic systolic CHF, nonischemic cardiomyopathy s/p AICD, chronic kidney disease stage III, coronary artery disease, COPD, type 2 diabetes, hypertension, hyperlipidemia presented with rectal bleeding and admitted with acute on chronic systolic CHF and evaluation of rectal bleeding.    Rectal bleeding and abdominal pain  Suspect diverticular versus hemorrhoidal bleeding.  No further rectal bleeding since admission.  Hemoglobin stable.  CT of the abdomen on admission no acute inflammatory process.  Mesenteric, inguinal lymphadenopathy of uncertain etiology.  Multiple renal cysts and subcentimeter hypodensities too small to characterize.    Hemoglobin stable.  No further rectal bleeding.  Appreciated GI input.  Given stable hemoglobin recommended outpatient colonoscopy.  Discussed with patient in length he is agreeable. Tolerating diet.  Need follow-up imaging to monitor lymphadenopathy.     Acute on chronic systolic CHF  Nonischemic cardiomyopathy with EF of 25% s/p AICD  BNP greater than 1300 on admisison.   Appreciated cardiology input.    Started on IV Lasix.  Volume status improved.  Weights trended down. Switched to p.o. torsemide 40 mg daily.    Continue daily weights, intake and output charting.  Given clear instructions to check weights daily and take additional dose of torsemide 20 mg for weight gain of 3 pounds overnight or 5 pounds in a week.  Follow-up with cardiology in 3 to 5 days on discharge.  Continue on aspirin, Coreg, Lipitor, Entresto, spironolactone.  Blood pressures has  been soft in 90s but he is asymptomatic.  Discussed with cardiology.  Recommended to continue on current doses as he is asymptomatic.  They will follow him as outpatient.    Abdominal lymphadenopathy  CT of the abdomen on admission showed mesenteric, inguinal lymphadenopathy of unclear etiology.  Appreciated Dr. Lynford Citizen input.  They will follow him as outpatient.    Acute on chronic kidney disease stage III  Creatinine between 1.6-1.8.  Need follow-up BMP as outpatient given increase in torsemide dose.    History of nonocclusive coronary artery disease  Continue on Coreg, Lipitor, aspirin.    Known COPD with no acute signs of exacerbation.    Continue on Breo and duo nebs as needed    Type 2 diabetes continue on Jardiance, Januvia and sliding scale insulin    Hypertension continue on Coreg, Entresto, spironolactone    Hyperlipidemia continue Lipitor    Anxiety and depression continue on Paxil    Plan discussed in detail with patient  and answered all the questions.    Disposition:home  Condition at Discharge and Prognosis: Stable  Admission Date:02/17/2021  Discharge Date: 02/19/21  Type of Admission:Inpatient   Code Status: Full Code  Subjective at the time of discharge:   Seen and examined this morning.  Feels well.  Wants to go home.  Denied any dizziness.  Denied any chest pain or shortness of breath.  Chief Complaint:  Rectal Bleeding    Objective:     Vitals:    02/19/21 1335 02/19/21 1414 02/19/21 1417 02/19/21 1627   BP: 101/66 (!) 89/58 98/64 92/61  Pulse: 77   76   Resp:    20   Temp:    97.2 F (36.2 C)   TempSrc:    Temporal   SpO2:    95%   Weight:       Height:         Physical Exam:   Comfortable in bed in no distress  CVS S1-S2 regular  Respiratory good air entry bilaterally  Abdomen soft nontender  Extremities no edema  Neuro awake alert oriented x3  Clinical Presentation:   History of Presenting Illness: Please refer to HPI in the Detailed H&P  Discharge Medications:   Discharge Medications:       Discharge Medication List      Taking    ALPRAZolam 0.25 MG tablet  Dose: 0.25 mg  Commonly known as: XANAX  Take 0.25 mg by mouth nightly as needed     aspirin EC 81 MG EC tablet  Dose: 81 mg  Take 81 mg by mouth daily     atorvastatin 40 MG tablet  Dose: 40 mg  Commonly known as: LIPITOR  Take 1 tablet (40 mg total) by mouth nightly     Breo Ellipta 100-25 MCG/INH Aepb  Dose: 1 puff  Generic drug: fluticasone furoate-vilanterol  Inhale 1 puff into the lungs daily     carvedilol 25 MG tablet  Dose: 25 mg  Commonly known as: COREG  Take 1 tablet (25 mg total) by mouth 2 (two) times daily with meals     docusate sodium 100 MG capsule  Commonly known as: COLACE  Take by mouth daily     empagliflozin 10 MG tablet  Dose: 10 mg  Commonly known as: JARDIANCE  Take 1 tablet (10 mg total) by mouth daily     Entresto 97-103 MG Tabs per tablet  Generic drug: sacubitril-valsartan  TAKE 1 TABLET BY MOUTH TWICE DAILY     glucose blood test strip  Dose: 1 each  Commonly known as: ONE TOUCH ULTRA TEST  1 each by Other route 2 (two) times daily Use as instructed     insulin glargine 100 UNIT/ML injection pen  ADMINISTER 50 UNITS UNDER THE SKIN AT BEDTIME     Janumet 50-1000 MG tablet  Dose: 1 tablet  Generic drug: SITagliptin-metFORMIN  Take 1 tablet by mouth 2 (two) times daily with meals     niacin 500 MG CR tablet  Commonly known as: NIASPAN  TAKE 1 TABLET BY MOUTH TWICE DAILY     PARoxetine 20 MG tablet  Dose: 20 mg  Commonly known as: Paxil  Take 1 tablet (20 mg total) by mouth every morning     spironolactone 25 MG tablet  Dose: 12.5 mg  Commonly known as: ALDACTONE  Take 0.5 tablets (12.5 mg total) by mouth daily     tamsulosin 0.4 MG Caps  Commonly known as: FLOMAX  TAKE 1 CAPSULE(0.4 MG) BY MOUTH DAILY AFTER DINNER     torsemide 20 MG tablet  Dose: 40 mg  What changed: how much to take  Commonly known as: Demadex  Take 2 tablets (40 mg total) by mouth every morning          Follow up recommendations:   Follow up:     Follow-up Information     Zorita Pang, MD .    Specialty: Family Medicine  Contact information:  [phone]  [address]  [place] [address]  [phone]  Marvell Fuller, MD. Schedule an appointment as soon as possible for a visit in 2 week(s).    Specialties: Medical Oncology, Internal Medicine  Contact information:  [phone] [facility]  [address]  [place] [address]  [phone]                        Results of Labs/imaging:   Labs have been reviewed:   Coagulation Profile:   Recent Labs   Lab 02/17/21  0326   PT 13.6*   PT INR 1.2*   PTT 34       CBC review:   Recent Labs   Lab 02/18/21  0556 02/17/21  1539 02/17/21  0953 02/17/21  0326   WBC 16.13*  --   --  15.31*   Hgb 12.5 12.6 12.6 12.3*   Hematocrit 38.5 38.7 39.1 38.1   Platelets 176  --   --  154   MCV 97.7*  --   --  97.9*   RDW 14  --   --  14   Neutrophils  --   --   --  22.6   Segmented Neutrophils 22  --   --   --    Neutrophils Absolute  --   --   --  3.46   Lymphocytes Automated  --   --   --  64.2   Eosinophils Automated  --   --   --  0.5   Immature Granulocytes  --   --   --  0.3   Immature Granulocytes Absolute  --   --   --  0.04     Chem Review:  Recent Labs   Lab 02/19/21  0543 02/18/21  0556 02/17/21  0326   Sodium 138 136 141   Potassium 4.0 4.3 4.6   Chloride 103 101 107   CO2 22 22 20*   BUN 35.2* 31.0* 32.8*   Creatinine 1.8* 1.6* 1.7*   Glucose 213* 209* 129*   Calcium 8.5 8.8 9.1   Magnesium 2.0 2.2 2.2   Bilirubin, Total  --  0.9 0.8   AST (SGOT)  --  15 22   ALT  --  22 27   Alkaline Phosphatase  --  62 64     Results     Procedure Component Value Units Date/Time    Glucose Whole Blood - POCT [[id number]]  (Abnormal) Collected: 02/19/21 1625     Updated: 02/19/21 1629     Whole Blood Glucose POCT 260 mg/dL     Glucose Whole Blood - POCT [[id number]]  (Abnormal) Collected: 02/19/21 1141     Updated: 02/19/21 1147     Whole Blood Glucose POCT 190 mg/dL     Glucose Whole Blood - POCT [[id number]]  (Abnormal) Collected: 02/19/21  0703     Updated: 02/19/21 0705     Whole Blood Glucose POCT 267 mg/dL     Basic Metabolic Panel [[id number]]  (Abnormal) Collected: 02/19/21 0543    Specimen: Blood Updated: 02/19/21 0621     Glucose 213 mg/dL      BUN 66.4 mg/dL      Creatinine 1.8 mg/dL      Calcium 8.5 mg/dL      Sodium 403 mEq/L      Potassium 4.0 mEq/L      Chloride 103 mEq/L      CO2 22 mEq/L      Anion Gap 13.0    Magnesium [[id number]] Collected: 02/19/21  0543    Specimen: Blood Updated: 02/19/21 0621     Magnesium 2.0 mg/dL     GFR [[id number]] Collected: 02/19/21 0543     Updated: 02/19/21 0621     EGFR 44.8    Glucose Whole Blood - POCT [[id number]]  (Abnormal) Collected: 02/18/21 2013     Updated: 02/18/21 2014     Whole Blood Glucose POCT 163 mg/dL         Radiology reports have been reviewed:  Radiology Results (24 Hour)     ** No results found for the last 24 hours. **        XR Chest  AP Portable    Result Date: 02/17/2021  HISTORY: Cough COMPARISON: 09/16/2017 FINDINGS: AP view of the chest was performed. Left cardiac pacing device noted. The cardiomediastinal silhouette appears normal. There is no vascular congestion. There is minimal atelectasis at the lung bases. No pleural effusion or pneumothorax.      No acute process. Demetrios Isaacs, MD  02/17/2021 3:58 AM    CT Abd/Pelvis with IV and PO Contrast    Result Date: 02/17/2021  HISTORY: Lower abdominal plain with rectal bleeding. COMPARISON: CT abdomen pelvis from 08/22/2017 TECHNIQUE: CT abdomen and pelvis WITH contrast. 100 mL IV Omnipaque 350 was administered. Oral contrast was administered.  The following dose reduction techniques were utilized: Automated exposure control and/or adjustment of the mA and/or kV according to patient size, and the use of iterative reconstruction technique. FINDINGS: LOWER THORAX:  There are scattered nodular and tree-in-bud and groundglass opacities in the lung bases. There is linear atelectasis versus scarring in the lower lobes. There are partially imaged  biventricular pacing wires. There is cardiomegaly with left atrial enlargement. There is a calcified granuloma in the right lower lobe. LIVER/BILIARY TREE:  No mass or intrahepatic biliary dilatation. No gallbladder distension or calcified gallstones. SPLEEN: No splenomegaly. PANCREAS:  No pancreatic mass or duct dilatation. KIDNEY/URETERS: No hydronephrosis, stones or solid mass lesions. There are multiple cysts in both kidneys. The largest in the left kidney measures 8.8 x 7 cm in the inferior pole. The largest in the right kidney measures 6.2 x 6 cm in the upper pole. Additional subcentimeter hypodensities are too small to characterize. ADRENALS:  No adrenal mass. PELVIC ORGANS/BLADDER: No pelvic masses. PERITONEUM/RETROPERITONEUM: No free air or fluid. LYMPH NODES: There is porta hepatis, mesenteric, and left greater the right iliac chain lymphadenopathy. VESSELS: No aortic aneurysm. There is mild multifocal atherosclerotic calcification of the aortoiliac vessels. GI TRACT: No bowel wall thickening or dilation. The appendix is normal. BONES AND SOFT TISSUES:  There is a small fat-containing right inguinal hernia. There are is moderate severe multisegmental degenerative spondylosis most pronounced in the lower lumbar spine. There is no acute osseous abnormality.     1.  Bilateral airspace opacities suggestive of bronchiolitis. 2.  No acute inflammatory process involving the abdomen or pelvis. 3.  Mesenteric, porta hepatis, and inguinal lymphadenopathy of uncertain etiology. Correlate with clinical history. 4.  Multiple renal cysts and subcentimeter hypodensities that are too small to characterize. These can be better evaluated with MRI on a nonemergent basis as clinically indicated. Overall these are similar to exam from 08/22/2017 Theador Hawthorne, MD  02/17/2021 4:49 AM    Pathology:   Specimens (From admission, onward)    None        Pending Lab Results:   Labs/Images to be followed at your PCP office:   Unresulted Labs      None  Hospitalist:   Signed by: Haze Boyden, MD  02/19/2021 4:35 PM  Time spent for discharge: 40 minutes      *This note was generated by the Epic EMR system/ Dragon speech recognition and may contain inherent errors or omissions not intended by the user. Grammatical errors, random word insertions, deletions, pronoun errors and incomplete sentences are occasional consequences of this technology due to software limitations. Not all errors are caught or corrected. If there are questions or concerns about the content of this note or information contained within the body of this dictation they should be addressed directly with the author for clarification

## 2021-02-19 NOTE — Progress Notes (Signed)
02/19/21 0300   Provider Notification   Reason for Communication Other (Comment)  (tele monitor tech advised RN of pt's 5 beats of v-tach, on assessment pt denies chest pain, headaches, dizziness, MD notified)   Provider Name Dr. Marthann Schiller   Provider Role Hospitalist   Method of Communication Call   Readback Results Yes   Response See orders

## 2021-02-19 NOTE — Progress Notes (Signed)
Initial Case Management Assessment and Discharge Planning          Neldon Labella. Danyle Boening, BSN, RN, CCM  RN Case Manager II  [facility]    P: [id number]   F: [phone]  [email or web address]         02/18/21 1800   Patient Type   Within 30 Days of Previous Admission? No   Healthcare Decisions   Interviewed: Patient   Orientation/Decision Making Abilities of Patient Alert and Oriented x3, able to make decisions   Advance Directive Patient does not have advance directive   Healthcare Agent Appointed Yes   (RETIRED) Healthcare Agent's Name Demauri Advincula (wife)   (RETIRED) Healthcare Agent's Phone Number [phone]   Additional Emergency Contacts? Rozell Searing [facility]): [phone]   Prior to admission   Prior level of function Independent with ADLs;Ambulates independently   Type of Residence Private residence   Home Layout Multi-level   Have running water, electricity, heat, etc? Yes   Living Arrangements Spouse/significant other   How do you get to your MD appointments? Self   How do you get your groceries? Self   Who fixes your meals? Self   Who does your laundry? Self   Who picks up your prescriptions? Self   Dressing Independent   Grooming Independent   Feeding Independent   Bathing Independent   Toileting Independent   Discharge Planning   Support Systems Spouse/significant other   Patient expects to be discharged to: Home   Anticipated [place] plan discussed with: Same as interviewed   Mode of transportation: Private car (family member)   Does the patient have perscription coverage? Yes   Consults/Providers   OT Evalulation Needed 2   SLP Evaluation Needed 2   Correct PCP listed in Epic? Yes   Family and PCP   PCP on file was verified as the current PCP? Yes   In case you are admitted, transferred or discharged, would like family notified? Yes   Name of family member to be notified Lajoyce Corners (wife)   In case you are admitted, transferred or discharged, would like your PCP notified? Yes   Important  Message from [facility] Notice   Patient received 1st IMM Letter? n/a

## 2021-02-19 NOTE — Discharge Instr - AVS First Page (Addendum)
Reason for your Hospital Admission:    Acute on chronic systolic CHF  Rectal bleeding, resolved      Instructions for after your discharge:    Continue on torsemide 40 mg daily.  Check daily weights.  If you notice weight gain of 3 pounds overnight or 5 pounds in a week take an additional dose of torsemide and follow-up with cardiology.  Continue with rest of the home medications.  Monitor your blood pressure closely.    Follow-up with Dr. Laurine Blazer regarding enlarged lymph nodes

## 2021-02-19 NOTE — Plan of Care (Signed)
Problem: Altered GI Function  Goal: Fluid and electrolyte balance are achieved/maintained  Outcome: Progressing  Flowsheets (Taken 02/19/2021 0423)  Fluid and electrolyte balance are achieved/maintained:   Monitor intake and output every shift   Monitor/assess lab values and report abnormal values   Provide adequate hydration   Assess for confusion/personality changes   Monitor daily weight   Assess and reassess fluid and electrolyte status   Observe for seizure activity and initiate seizure precautions if indicated   Observe for cardiac arrhythmias   Monitor for muscle weakness     Problem: Altered GI Function  Goal: Elimination patterns are normal or improving  Outcome: Progressing  Flowsheets (Taken 02/19/2021 0423)  Elimination patterns are normal or improving:   Report abnormal assessment to physician   Anticipate/assist with toileting needs   Assess for normal bowel sounds   Monitor for abdominal distension   Monitor for abdominal discomfort     Problem: Altered GI Function  Goal: Nutritional intake is adequate  Outcome: Progressing  Flowsheets (Taken 02/19/2021 0423)  Nutritional intake is adequate:   Monitor daily weights   Encourage/perform oral hygiene as appropriate   Consult/collaborate with Clinical Nutritionist     Problem: Altered GI Function  Goal: No bleeding  Outcome: Progressing  Flowsheets (Taken 02/19/2021 0423)  No bleeding:   Monitor and assess vitals and hemodynamic parameters   Monitor/assess lab values and report abnormal values   Assess for bruising/petechia     Problem: Everyday - Heart Failure  Goal: Stable Vital Signs and Fluid Balance  Outcome: Progressing  Flowsheets (Taken 02/19/2021 0423)  Stable Vital Signs and Fluid Balance:   Daily Standing Weights in the morning using the same scale, after using the bathroom and before breadfast.  If unable to stand, zero the bed and use the bed scale   Monitor, assess vital signs and telemetry per policy   Monitor labs and report abnormalities to  physician   Strict Intake/Output   Fluid Restriction   Assess for swelling/edema     Problem: Everyday - Heart Failure  Goal: Mobility/Activity is Maintained at Optimal Level for Patient  Outcome: Progressing  Flowsheets (Taken 02/19/2021 0423)  Mobility/Activity is Maintained at Optimal Level for Patient:   Increase mobility as tolerated/progressive mobility protocol   Maintain SCD's as Ordered   Perform active/passive ROM   Assess for changes in respiratory status, level of consciousness and/or development of fatigue     Problem: Everyday - Heart Failure  Goal: Teaching-Using CHF Warning Zones and Educational Videos  Outcome: Progressing  Flowsheets (Taken 02/19/2021 0423)  Teaching-Using CHF Warning Zones and Educational Videos:   Signs & Symptoms of CHF   Daily Standing Weights & record   CHF Warning Zones and when to call for help   Medications   Sodium Restriction   Fluid Restriction if appropriate   Document in the Education Tab in EPIC with Teach-back

## 2021-02-19 NOTE — Plan of Care (Signed)
Problem: Everyday - Heart Failure  Goal: Stable Vital Signs and Fluid Balance  Outcome: Adequate for Discharge  Flowsheets (Taken 02/19/2021 0423 by Chesley Noon, RN)  Stable Vital Signs and Fluid Balance:   Daily Standing Weights in the morning using the same scale, after using the bathroom and before breadfast.  If unable to stand, zero the bed and use the bed scale   Monitor, assess vital signs and telemetry per policy   Monitor labs and report abnormalities to physician   Strict Intake/Output   Fluid Restriction   Assess for swelling/edema  Goal: Mobility/Activity is Maintained at Optimal Level for Patient  Outcome: Adequate for Discharge  Flowsheets (Taken 02/19/2021 0423 by Chesley Noon, RN)  Mobility/Activity is Maintained at Optimal Level for Patient:   Increase mobility as tolerated/progressive mobility protocol   Maintain SCD's as Ordered   Perform active/passive ROM   Assess for changes in respiratory status, level of consciousness and/or development of fatigue  Goal: Nutritional Intake is Adequate  Outcome: Adequate for Discharge  Goal: Teaching-Using CHF Warning Zones and Educational Videos  Outcome: Adequate for Discharge

## 2021-02-19 NOTE — Progress Notes (Signed)
Discharge order received. Patient in stable condition. IV and tele monitor removed. Discharge orders reviewed with the patient. He acknowledged understanding. Patient accompanied to the family car by clinical staff.

## 2021-02-19 NOTE — Progress Notes (Addendum)
[facility]  PROGRESS NOTE  [facility]    Date Time: 02/19/21 11:34 AM  Patient Name: [name] [name], [name] [name]         Assessment:    Acute on chronic systolic congestive heart failure.   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 I have reviewed and edited the details outlined by my colleague [name] FNP in this note as needed. I would add the following key elements of the patient's history, physical exam and plan after my personal evaluation:       Improved edema.  Doing well on PO diuretics, decreased lungs sounds but normal CXR a couple of days ago.  No new dyspnea.  Feels well to go home and no difficulty with ambulation.  Regular heart, supple neck, anicteric sclera, nontender abdomen with minimal distension.      D/c home with daily torsemide but double if weight increases by 3-5#  Has f/u appt with our CHF APP [facility]ms on 5/17  I added on to this morning's labs a BNP for future comparison but doesn't need to wait for that to result.    Consider outpt PYP/CMR to evaluate for NICM but no carpal tunnel and negative troponin makes amyloid less likely.      Grace Blight, MD            Medications:     Current Facility-Administered Medications   Medication Dose Route Frequency   . aspirin  81 mg Oral Daily   . atorvastatin  40 mg Oral QHS   . carvedilol  25 mg Oral Q12H SCH   . docusate sodium  100 mg Oral BID   . empagliflozin  10 mg Oral Daily   . fluticasone furoate-vilanterol  1 puff Inhalation QAM   . insulin lispro  1-3 Units Subcutaneous QHS   .  insulin lispro  1-5 Units Subcutaneous TID AC   . niacin  500 mg Oral BID   . PARoxetine  20 mg Oral Daily   . sacubitril-valsartan  1 tablet Oral BID   . SITagliptin  50 mg Oral Daily   . spironolactone  12.5 mg Oral Daily   . torsemide  40 mg Oral Daily         Subjective:   Denies chest pain, SOB or palpitations.      Physical Exam:     Visit Vitals  BP 113/73   Pulse 74   Temp (!) 96.7 F (35.9 C) (Temporal)   Resp 20   Ht 1.829 m (6' 0.01")   Wt 93.9 kg (207 lb)   SpO2 96%   BMI 28.07 kg/m       Temp (24hrs), Avg:97.4 F (36.3 C), Min:96.7 F (35.9 C), Max:97.8 F (36.6 C)    Weight Monitoring 11/22/2020 01/13/2021 01/29/2021 02/17/2021 02/17/2021 02/18/2021 02/19/2021   Height 182.9 cm 172.7 cm 182.9 cm 182.9 cm 182.9 cm - -   Height Method - - - Stated Stated - -   Weight 96.163 kg 102.059 kg 96.979  kg 89.359 kg 94.938 kg 93.532 kg 93.895 kg   Weight Method - - - Stated Bed Scale Standing Scale Standing Scale   BMI (calculated) 28.8 kg/m2 34.3 kg/m2 29.1 kg/m2 26.8 kg/m2 28.4 kg/m2 - -       Telemetry reviewed: vpaced     Intake and Output Summary (Last 24 hours) at Date Time    Intake/Output Summary (Last 24 hours) at 02/19/2021 1133  Last data filed at 02/19/2021 0900  Gross per 24 hour   Intake 830 ml   Output 1285 ml   Net -455 ml       General Appearance:  Breathing comfortable, no acute distress  Neck:  No carotid bruit or jugular venous distension, brisk carotid upstroke  Lungs: normal respiratory effort   Heart:  S1, S2 normal, no S3, no S4, no murmur, PMI not displaced, no rub   Abdomen:  Soft, non-tender, no bruits, no increased abdominal aorta  Extremities:  No cyanosis, clubbing or edema  Neurologic:  Alert and oriented x3,     Labs:     Recent Labs   Lab 02/17/21  1539 02/17/21  0953 02/17/21  0326   Creatine Kinase (CK)  --  152  --    Troponin I 0.03 0.03 0.05         Recent Labs   Lab 02/18/21  0556   ALT 22   AST (SGOT) 15     Recent Labs   Lab 02/19/21  0543   Magnesium 2.0     Recent Labs   Lab  02/17/21  0326   PT INR 1.2*   PTT 34     Recent Labs   Lab 02/18/21  0556 02/17/21  1539 02/17/21  0953 02/17/21  0326   WBC 16.13*  --   --  15.31*   Hgb 12.5 12.6 12.6 12.3*   Hematocrit 38.5 38.7 39.1 38.1   Platelets 176  --   --  154     Recent Labs   Lab 02/19/21  0543 02/18/21  0556 02/17/21  0326   Sodium 138 136 141   Potassium 4.0 4.3 4.6   Chloride 103 101 107   CO2 22 22 20*   BUN 35.2* 31.0* 32.8*   Creatinine 1.8* 1.6* 1.7*   EGFR 44.8 51.4 47.9   Glucose 213* 209* 129*     Estimated Creatinine Clearance: 42.8 mL/min (A) (based on SCr of 1.8 mg/dL (H)).  .  Lab Results   Component Value Date    BNP 1,354.5 (H) 02/17/2021        Signed by: Dennison Bulla, FNP    [facility]  NP Spectralink [phone] (8am-5pm)  MD Spectralink [phone] (8am-5pm)  After hours, non urgent consult line [phone]  After Hours, urgent consults [phone]

## 2021-02-20 ENCOUNTER — Telehealth (INDEPENDENT_AMBULATORY_CARE_PROVIDER_SITE_OTHER): Payer: Self-pay

## 2021-02-20 NOTE — Telephone Encounter (Addendum)
Hospital D/C Template    Chart Review:     Type of Encounter : Inpatient  Facility: [facility]  Discharge Date: 02/19/21   Primary Discharge Dx: Acute on chronic systolic CHF (congestive heart failure) Resolved  Leukocytosis Resolved  Lower GI bleed Resolved  Rectal bleeding and abdominal pain  Acute on chronic systolic CHF  Nonischemic cardiomyopathy with EF of 25% s/p AICD  Abdominal lymphadenopathy  Acute on chronic kidney disease stage III  History of nonocclusive coronary artery disease  Known COPD with no acute signs of exacerbation.  Type 2 diabetes  Hypertension  Hyperlipidemia  Anxiety and depression      Follow Up Appt with PCP/Specialist: Cardiology FUV 5/17, also for FUV with PCP and Hematology to be scheduled    TC for post hospital follow-up, LVM for pt regarding current status and FUV for ongoing care, advised to contact PCP office to schedule hospital FUV; unable to reach pt for hospital follow-up call and referred back to PCP office for further concerns.  " If there is anything that you need, please give your office a call at [facility] [phone]."       *Instructed patient to call back if symptoms change or worsen and/or go to the emergency room or call 911 for emergency symptoms*

## 2021-03-04 ENCOUNTER — Other Ambulatory Visit (INDEPENDENT_AMBULATORY_CARE_PROVIDER_SITE_OTHER): Payer: Self-pay | Admitting: Nurse Practitioner

## 2021-03-04 ENCOUNTER — Encounter (INDEPENDENT_AMBULATORY_CARE_PROVIDER_SITE_OTHER): Payer: Self-pay | Admitting: Nurse Practitioner

## 2021-03-04 ENCOUNTER — Ambulatory Visit (INDEPENDENT_AMBULATORY_CARE_PROVIDER_SITE_OTHER): Payer: Commercial Managed Care - POS | Admitting: Nurse Practitioner

## 2021-03-04 ENCOUNTER — Telehealth (INDEPENDENT_AMBULATORY_CARE_PROVIDER_SITE_OTHER): Payer: Self-pay | Admitting: Family Medicine

## 2021-03-04 VITALS — BP 128/82 | HR 60 | Ht 72.0 in | Wt 204.2 lb

## 2021-03-04 DIAGNOSIS — I5022 Chronic systolic (congestive) heart failure: Secondary | ICD-10-CM

## 2021-03-04 DIAGNOSIS — I25700 Atherosclerosis of coronary artery bypass graft(s), unspecified, with unstable angina pectoris: Secondary | ICD-10-CM

## 2021-03-04 DIAGNOSIS — I428 Other cardiomyopathies: Secondary | ICD-10-CM

## 2021-03-04 MED ORDER — ENTRESTO 97-103 MG PO TABS
1.0000 | ORAL_TABLET | Freq: Two times a day (BID) | ORAL | 3 refills | Status: DC
Start: 2021-03-04 — End: 2021-09-04

## 2021-03-04 NOTE — Telephone Encounter (Signed)
Called pt and left voice message.

## 2021-03-04 NOTE — Progress Notes (Signed)
[facility] ADVANCED HEART FAILURE OFFICE PROGRESS NOTE    HRT [facility] OFFICE -CARDIOLOGY  [address]  [place] [address]  Dept: [phone]  Dept Fax: [phone]       Patient Name: [name] [name]    Date of Visit:  Mar 04, 2021  Date of Birth: Aug 07, 1946  AGE: 75 y.o.  Medical Record #: [id number]        CHIEF COMPLAINT:    Chief Complaint   Patient presents with   . Hospital Follow-up       HISTORY OF PRESENT ILLNESS:    I had the pleasure of seeing Mr. Travin Marik today in follow up in the Advanced Heart Failure Clinic at [facility]. Patient is a pleasant 75 year old male who was admitted on 02/17/2021 with abdominal swelling, shortness of breath, and orthopnea.  He was recently started on Jardiance and his torsemide dose was reduced to 20 mg daily.  He was admitted for CHF exacerbation and was diuresed with IV Lasix.  He was eventually discharged on torsemide 40 mg daily.  For GDMT, he also takes carvedilol 25 mg twice daily, Entresto 97/23 mg twice daily, Jardiance 10 mg daily, and spironolactone 12.5 mg daily.  BNP prior to discharge came down from 1354-231.  Metabolic panel from 02/19/2021 showed BUN 35.2, creatinine 1.8, potassium 4.0.    Since discharge, reports feeling well overall. Denies any chest pain/discomfort, profound shortness of breath, dizziness, palpitations, orthopnea, PND, or edema. His weight is stable around 204-206lbs at home. He does not check his blood pressures at home. He recently ran out of [place] and had not been taking it for a couple of days.       PAST MEDICAL HISTORY: He has a past medical history of Acute CHF, Acute systolic (congestive) heart failure (08/2017), Anxiety, CAD (coronary artery disease), Cardiomyopathy, CHF (congestive heart failure) (08/12/2014, 2013), Chronic obstructive pulmonary disease, Coronary artery disease (2011), Depression, echocardiogram (02/2016, 11/2016, 09/2017,  12/20/2017), GERD (gastroesophageal reflux disease), Heart attack (2011), Hyperlipemia, Hypertension, ICD (implantable 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, Ischemic cardiomyopathy, Nonischemic cardiomyopathy, NSTEMI (non-ST elevated myocardial infarction), 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 He has a past surgical history that includes duodenal ulcer (1973); orthopedic surgery (AGE 78); EGD (N/A, 08/15/2014); Circumcision (AGE 75); Fracture surgery (AGE 63); Hernia repair (AGE 75); Colonoscopy (2009); Tonsillectomy and adenoidectomy (1954); Cardiac defibrillator placement (12/2012); Cardiac pacemaker placement (2014); EGD (1975); EGD, COLONOSCOPY (N/A, 04/04/2018); ECHOCARDIOGRAM, TRANSTHORACIC (02/2016 11/2016 09/2017 12/2017); MPI nuclear study (06/2016); Correction hammer toe; BIV (11/10/2016); ECHOCARDIOGRAM, TRANSTHORACIC (02/2016,11/2016,12/20/2017); Cardiac catheterization (03/2010); and Cardiac catheterization (12/2012).    ALLERGIES: No Known Allergies    MEDICATIONS:   Current Outpatient Medications:   .  ALPRAZolam (XANAX) 0.25 MG tablet, Take 0.25 mg by mouth nightly as needed, Disp: , Rfl:   .  aspirin EC 81 MG EC tablet, Take 81 mg by mouth daily, Disp: , Rfl:   .  atorvastatin (LIPITOR) 40 MG tablet, Take 1 tablet (40 mg total) by mouth nightly, Disp: 90 tablet, Rfl: 0  .  carvedilol (COREG) 25 MG tablet, Take 1 tablet (25 mg total) by mouth 2 (two) times daily with meals, Disp: 180 tablet, Rfl: 3  .  docusate sodium (COLACE) 100 MG capsule, Take by mouth daily, Disp: , Rfl:   .  empagliflozin (JARDIANCE) 10 MG tablet, Take 1 tablet (10 mg total)  by mouth daily, Disp: 90 tablet, Rfl: 3  .  fluticasone furoate-vilanterol (Breo Ellipta) 100-25 MCG/INH Aerosol Pwdr, Breath Activated, Inhale 1 puff into the lungs daily, Disp: 3 each, Rfl: 3  .  glucose blood (ONE  TOUCH ULTRA TEST) test strip, 1 each by Other route 2 (two) times daily Use as instructed, Disp: 300 each, Rfl: 1  .  insulin glargine (LANTUS SOLOSTAR) 100 UNIT/ML injection pen, ADMINISTER 50 UNITS UNDER THE SKIN AT BEDTIME, Disp: 45 mL, Rfl: 3  .  niacin (NIASPAN) 500 MG CR tablet, TAKE 1 TABLET BY MOUTH TWICE DAILY, Disp: 180 tablet, Rfl: 3  .  PARoxetine (Paxil) 20 MG tablet, Take 1 tablet (20 mg total) by mouth every morning, Disp: 30 tablet, Rfl: 11  .  SITagliptin-metFORMIN (Janumet) 50-1000 MG tablet, Take 1 tablet by mouth 2 (two) times daily with meals, Disp: 180 tablet, Rfl: 3  .  spironolactone (ALDACTONE) 25 MG tablet, Take 0.5 tablets (12.5 mg total) by mouth daily, Disp: 45 tablet, Rfl: 3  .  tamsulosin (FLOMAX) 0.4 MG Cap, TAKE 1 CAPSULE(0.4 MG) BY MOUTH DAILY AFTER DINNER, Disp: 90 capsule, Rfl: 3  .  torsemide (Demadex) 20 MG tablet, Take 2 tablets (40 mg total) by mouth every morning, Disp: 90 tablet, Rfl: 0  .  sacubitril-valsartan (Entresto) 97-103 MG Tab per tablet, Take 1 tablet by mouth 2 (two) times daily, Disp: 180 tablet, Rfl: 3     FAMILY HISTORY: family history includes Breast cancer in his mother; Coronary artery disease in his mother; Diabetes in his brother and mother; Heart attack in his father; Heart attack (age of onset: 35) in his mother; Hypertension in his father and mother.    SOCIAL HISTORY: He reports that he has never smoked. He has never used smokeless tobacco. He reports that he does not drink alcohol and does not use drugs.      PHYSICAL EXAMINATION    Visit Vitals  BP 128/82 (BP Site: Left arm, Patient Position: Sitting, Cuff Size: Medium)   Pulse 60   Ht 1.829 m (6')   Wt 92.6 kg (204 lb 3.2 oz)   BMI 27.69 kg/m       Last 3 Weights:   Weight Monitoring 02/18/2021 02/19/2021 03/04/2021   Height - - 182.9 cm   Height Method - - -   Weight 93.532 kg 93.895 kg 92.625 kg   Weight Method Standing Scale Standing Scale -   BMI (calculated) - - 27.8 kg/m2         General  Appearance:  Cooperative and in no acute distress.    Skin: Warm and dry to touch  Head: Normocephalic  Eyes: Conjunctivae and lids unremarkable.    ENT: wearing mask   Neck: No JVD  Chest: Clear to auscultation bilaterally   Cardiovascular: Regular rhythm, S1 normal, S2 normal, No S3 or S4  Abdomen: Soft, nontender with normoactive bowel sounds.   Extremities: Warm without edema, +2 b/l radial pulses  Neuro: Alert and oriented x3. No gross motor or sensory deficits noted, affect appropriate.          IMPRESSION / RECOMMENDATIONS:   1. Mr. Meacham is a 75 y.o. male with chronic systolic heart failure due to nonischemic cardiomyopathy, ACC/AHA stage C, NYHA class 3. The last LVEF was 15% demonstrated by echocardiogram in May 2021 and 20-25% in July 2021. Nonocclusive CAD by prior cath in 2014      2. Goal directed therapy includes:   Beta blocker:  Carvedilol 25 mg twice daily   ACE/ARB/ARNI: Restart Entresto 97/103 mg twice daily. Refills sent to pharmacy    MRA: Spironolactone 12.5 mg daily   SGLT2-imhibitors: Jardiance 10mg  once daily  3. Current diuretic regimen: Continue Torsemide 40mg  daily. Instructed to take additional 1 tab in the AM for weight gain by 2-3lbs in one day or 5-6lbs in one week, worsening shortness of breath, or lower extremity edema   4. Repeat BMP in 2 weeks to reassess renal function and electrolytes. If renal function WNL, can consider increase spironolactone to 25 mg once daily    5. Cardiac MRI to evaluate for NICM   6. Follow up with Dr. Webb Silversmith 4 weeks, or sooner if needed                                                    Orders Placed This Encounter   Procedures   . MRI Cardiac MRPH + Function w/wo Contrast   . Basic Metabolic Panel   . ADVANCED HEART FAILURE OFFICE VISIT (HRT [place])       Orders Placed This Encounter   Medications   . sacubitril-valsartan (Entresto) 97-103 MG Tab per tablet     Sig: Take 1 tablet by mouth 2 (two) times daily     Dispense:  180 tablet     Refill:  3          SIGNED:    Con[facility] Oms, NP          This note was generated by the Dragon speech recognition and may contain errors or omissions not intended by the user. Grammatical errors, random word insertions, deletions, pronoun errors, and incomplete sentences are occasional consequences of this technology due to software limitations. Not all errors are caught or corrected. If there are questions or concerns about the content of this note or information contained within the body of this dictation, they should be addressed directly with the author for clarification.

## 2021-03-04 NOTE — Telephone Encounter (Signed)
Pt is returning your call

## 2021-03-04 NOTE — Telephone Encounter (Signed)
Name, strength, directions of requested refill(s):    Entresto 97-103 MG Tab per tablet    requested pharmacy in BOLD):      [facility] #[id number] [place], [place] - [address] AT [facility] OF [address]  [address]  [place] [address]  Phone: [phone] Fax: [phone]    [facility] #[id number] - [place], [place] - [address] AT [facility] OF [address]  [address]  [place] [address]  Phone: [phone] Fax: [phone]    preferred call back number:    Mobile:   Telephone Information:   Mobile [phone]

## 2021-03-05 ENCOUNTER — Telehealth (INDEPENDENT_AMBULATORY_CARE_PROVIDER_SITE_OTHER): Payer: Self-pay | Admitting: Family Medicine

## 2021-03-05 NOTE — Telephone Encounter (Signed)
Patient needs to be seen for a persistent dry cough and is requesting to be squeezed in for a sooner appointment than available. Pt is requesting to see PCP preferably this Friday .          Please return call at noted phone number below.        Patient Preferred Callback Number:   Phone: [phone]     Thank you

## 2021-03-05 NOTE — Telephone Encounter (Signed)
Lvm for patient to call back to schedule sooner appointment with Dr. Altamease Oiler.

## 2021-03-06 ENCOUNTER — Other Ambulatory Visit (FREE_STANDING_LABORATORY_FACILITY): Payer: Commercial Managed Care - POS

## 2021-03-06 DIAGNOSIS — I428 Other cardiomyopathies: Secondary | ICD-10-CM

## 2021-03-06 DIAGNOSIS — I5022 Chronic systolic (congestive) heart failure: Secondary | ICD-10-CM

## 2021-03-06 LAB — BASIC METABOLIC PANEL
Anion Gap: 8 (ref 5.0–15.0)
BUN: 17 mg/dL (ref 9.0–28.0)
CO2: 21 mEq/L (ref 21–29)
Calcium: 8.8 mg/dL (ref 7.9–10.2)
Chloride: 109 mEq/L (ref 100–111)
Creatinine: 1.5 mg/dL (ref 0.5–1.5)
Glucose: 115 mg/dL — ABNORMAL HIGH (ref 70–100)
Potassium: 5.1 mEq/L (ref 3.5–5.1)
Sodium: 138 mEq/L (ref 136–145)

## 2021-03-06 LAB — GFR: EGFR: 55.3

## 2021-03-06 LAB — HEMOLYSIS INDEX: Hemolysis Index: 16 (ref 0–24)

## 2021-03-10 ENCOUNTER — Encounter (INDEPENDENT_AMBULATORY_CARE_PROVIDER_SITE_OTHER): Payer: Self-pay | Admitting: Family Medicine

## 2021-03-10 ENCOUNTER — Ambulatory Visit (INDEPENDENT_AMBULATORY_CARE_PROVIDER_SITE_OTHER): Payer: Commercial Managed Care - POS | Admitting: Family Medicine

## 2021-03-10 VITALS — BP 116/64 | HR 47 | Temp 97.4°F | Wt 214.0 lb

## 2021-03-10 DIAGNOSIS — R059 Cough, unspecified: Secondary | ICD-10-CM

## 2021-03-10 MED ORDER — GUAIFENESIN-CODEINE 100-10 MG/5ML PO SYRP
5.0000 mL | ORAL_SOLUTION | Freq: Three times a day (TID) | ORAL | 0 refills | Status: DC | PRN
Start: 2021-03-10 — End: 2021-11-12

## 2021-03-10 NOTE — Progress Notes (Addendum)
Subjective:      Date: 03/10/2021 3:52 PM   Patient ID: [name] [name] is a 75 y.o. male.    Chief Complaint:  Chief Complaint   Patient presents with   . Cough     Since 2 weeks.       HPI:  HPI    Patient was discharged from the hospital about 2 weeks ago.  Since he left the hospital he has dry cough he was told to follow-up with his primary.  Patient does not complain about shortness of breath.  He tells me that his cough seems to be very similar to the cough he had a couple of years ago at this point I prescribed Tussionex Pennkinetic and that worked well for him.  Patient's vital signs are pretty much in the normal range.  He is not complaining about sputum.  This is a dry cough.  Patient is otherwise asymptomatic and feels good.  I will do a COVID test if the COVID test is negative I will treat patient conservatively with cough medication.      Patient tested positive for COVID.  We discussed the risks and benefits of the COVID medications Paxlovid.  At this point we decided not to treat with this medication because patient's symptoms are already going on for 2 weeks and patient feels overall good.  Patient will treat the cough conservatively.  He will call me if he get any more symptoms.    Problem List:  Patient Active Problem List   Diagnosis   . Chest pain   . Type 2 diabetes mellitus with complication, with long-term current use of insulin   . Heart attack   . CAD (coronary artery disease)   . Non-ischemic cardiomyopathy   . Acute exacerbation of CHF (congestive heart failure)   . PUD (peptic ulcer disease)   . Elevated LFTs   . Acute dyspnea   . Elevated d-dimer   . CHF (congestive heart failure)   . Headaches due to old head injury   . Essential hypertension   . Pneumonia due to infectious organism, unspecified laterality, unspecified part of lung   . Syncope and collapse   . AICD (automatic cardioverter/defibrillator) present   . Syncope, unspecified syncope type   . Pneumonia due to infectious  organism   . Dyspnea, unspecified type   . Cardiomyopathy   . Cough   . Sepsis   . Sepsis, due to unspecified organism   . SOB (shortness of breath)   . History of colon polyps   . GERD (gastroesophageal reflux disease)   . Hypertensive heart disease without heart failure   . Atherosclerotic heart disease of native coronary artery without angina pectoris   . Heart failure, unspecified   . Nonischemic cardiomyopathy   . BO (blackout)   . Belching   . Acute subendocardial infarction, subsequent episode of care   . Difficulty sleeping   . Dizziness   . Encounter for examination for normal comparison and control in clinical research program   . Fatigue   . Nasal congestion   . Paroxysmal ventricular tachycardia   . Pure hypercholesterolemia   . Tachycardia, unspecified   . Chronic systolic congestive heart failure   . Encounter for implantable defibrillator reprogramming or check   . Episode of recurrent major depressive disorder, unspecified depression episode severity   . 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 Anxiety and depression   . COPD (  chronic obstructive pulmonary disease)   . HLD (hyperlipidemia)       Current Medications:  Outpatient Medications Marked as Taking for the 03/10/21 encounter (Office Visit) with Zorita Pang, MD   Medication Sig Dispense Refill   . ALPRAZolam (XANAX) 0.25 MG tablet Take 0.25 mg by mouth nightly as needed     . aspirin EC 81 MG EC tablet Take 81 mg by mouth daily     . atorvastatin (LIPITOR) 40 MG tablet Take 1 tablet (40 mg total) by mouth nightly 90 tablet 0   . carvedilol (COREG) 25 MG tablet Take 1 tablet (25 mg total) by mouth 2 (two) times daily with meals 180 tablet 3   . docusate sodium (COLACE) 100 MG capsule Take by mouth daily     . empagliflozin (JARDIANCE) 10 MG tablet Take 1 tablet (10 mg total) by mouth daily 90 tablet 3   . fluticasone furoate-vilanterol (Breo Ellipta) 100-25 MCG/INH Aerosol Pwdr, Breath Activated Inhale 1 puff  into the lungs daily 3 each 3   . glucose blood (ONE TOUCH ULTRA TEST) test strip 1 each by Other route 2 (two) times daily Use as instructed 300 each 1   . insulin glargine (LANTUS SOLOSTAR) 100 UNIT/ML injection pen ADMINISTER 50 UNITS UNDER THE SKIN AT BEDTIME 45 mL 3   . niacin (NIASPAN) 500 MG CR tablet TAKE 1 TABLET BY MOUTH TWICE DAILY 180 tablet 3   . PARoxetine (Paxil) 20 MG tablet Take 1 tablet (20 mg total) by mouth every morning 30 tablet 11   . sacubitril-valsartan (Entresto) 97-103 MG Tab per tablet Take 1 tablet by mouth 2 (two) times daily 180 tablet 3   . SITagliptin-metFORMIN (Janumet) 50-1000 MG tablet Take 1 tablet by mouth 2 (two) times daily with meals 180 tablet 3   . spironolactone (ALDACTONE) 25 MG tablet Take 0.5 tablets (12.5 mg total) by mouth daily 45 tablet 3   . tamsulosin (FLOMAX) 0.4 MG Cap TAKE 1 CAPSULE(0.4 MG) BY MOUTH DAILY AFTER DINNER 90 capsule 3   . torsemide (Demadex) 20 MG tablet Take 2 tablets (40 mg total) by mouth every morning 90 tablet 0       Allergies:  No Known Allergies    Past Medical History:  Past Medical History:   Diagnosis Date   . Acute CHF     NOV  & DEC 2018, - DIALYSIS CATH PLACED Aug 24 2017 TO REMOVE FLUID    . Acute systolic (congestive) heart failure 08/2017   . Anxiety    . CAD (coronary artery disease)    . Cardiomyopathy     nonischemic   . CHF (congestive heart failure) 08/12/2014, 2013   . Chronic obstructive pulmonary disease     POSSIBLE PER PT HE USES  SYNBICORT BID ABD  BREO INHALER PRN   . Coronary artery disease 2011    CORONARY STENT PLACEMENT FOLLOWED BY [facility]   . Depression    . echocardiogram 02/2016, 11/2016, 09/2017, 12/20/2017   . GERD (gastroesophageal reflux disease)    . Heart attack 2011    and 2014   . Hyperlipemia    . Hypertension    . ICD (implantable 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     PLACED 2014  [facility]  LAST INTERROGATION April 01 2018 REPORT REQUESTED   . Ischemic cardiomyopathy     EF 15% ON ECHO 09-20-2017 DR  GARG IN EP[facility]   . Nonischemic cardiomyopathy    . NSTEMI (non-ST elevated  myocardial infarction)    . 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   . Pacemaker 2014    MEDTRONIC ICD/PACEMAKER COMBO LAST INTERROGATION  12-12 2018   . Pneumonia 09/2016   . Primary cardiomyopathy     Ischemic cardiomyopathy EF 15% ON ECHO 09-20-2017 DR GARG IN EP[facility]   . Sepsis 08/2017   . Syncope and collapse     PRIOR TO ICD/PACEMAKER PLACED 2014   . Type 2 diabetes mellitus, controlled DX 1998    BS  AVG 100  A1C 7.4 Dec 27 2017   . Wheeze     PT SEE HIS PMD April 01 2018 RE THIS-TOLD TO SEE PMD BY [facility] JUNE 12 WHEN THEY SAW HIM FOR A CL       Past Surgical History:  Past Surgical History:   Procedure Laterality Date   . BIV  11/10/2016    ICD METRONIC  UPGRADE    . CARDIAC CATHETERIZATION  03/2010    CORONARY STENT PLACEMENT X 2 PER PT; LM normal, LAD 70-80% distal lesion at apex, 20% lesion mid CFX   . CARDIAC CATHETERIZATION  12/2012   . CARDIAC DEFIBRILLATOR PLACEMENT  12/2012    [facility]   . CARDIAC PACEMAKER PLACEMENT  2014    MEDTRONIC ICD/PACEMAKER PLACED [facility]   . CIRCUMCISION  AGE 71   . COLONOSCOPY  2009   . CORRECTION HAMMER TOE     . duodenal ulcer  1973    STRESS RELATED IN SCHOOL   . ECHOCARDIOGRAM, TRANSTHORACIC  02/2016 11/2016 09/2017 12/2017   . ECHOCARDIOGRAM, TRANSTHORACIC  02/2016,11/2016,12/20/2017   . EGD N/A 08/15/2014    Procedure: EGD;  Surgeon: Colon Branch, MD;  Location: [facility];  Service: Gastroenterology;  Laterality: N/A;  egd w/ bx   . EGD  1975   . EGD, COLONOSCOPY N/A 04/04/2018    Procedure: EGD with bxs, COLONOSCOPY with polypectomy and clipping;  Surgeon: Doyne Keel, MD;  Location: [facility];  Service: Gastroenterology;  Laterality: N/A;   . FRACTURE SURGERY  AGE 31     RT  ANKLE- CAST APPLIED   . HERNIA REPAIR  AGE 71    LEFT INGUINAL HERNIA   . MPI nuclear study  06/2016   . orthopedic surgery  AGE 73    RT foot corrective - HAMMERTOE   . TONSILLECTOMY AND ADENOIDECTOMY  1954        Family History:  Family History   Problem Relation Age of Onset   . Breast cancer Mother    . Heart attack Mother 48   . Hypertension Mother    . Diabetes Mother    . Coronary artery disease Mother    . Diabetes Brother    . Heart attack Father    . Hypertension Father        Social History:  Social History     Socioeconomic History   . Marital status: Married     Spouse name: Lajoyce Corners   . Number of children: 0   Occupational History   . 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 Smoking status: Never Smoker   . Smokeless tobacco: Never Used   Vaping Use   . Vaping Use: Never used   Substance and Sexual Activity   . Alcohol use: No     Alcohol/week: 0.0 standard drinks     Comment: occasionally   . Drug use: No   . Sexual activity: Yes  Partners: Female        The following sections were reviewed this encounter by the provider:          Vitals:  BP 116/64 (BP Site: Left arm, Patient Position: Sitting, Cuff Size: Medium)   Pulse (!) 47   Temp 97.4 F (36.3 C) (Temporal)   Wt 97.1 kg (214 lb)   SpO2 95%   BMI 29.02 kg/m     ROS:  Constitutional: Negative for fever, chills and malaise/fatigue.   Respiratory: Negative for cough, shortness of breath and wheezing.    Cardiovascular: Negative for chest pain and palpitations.   Gastrointestinal: Negative for abdominal pain, diarrhea and constipation.   Musculoskeletal: Negative for joint pain.   Skin: Negative for rash.   Neurological: Negative for dizziness and headaches.   Psychiatric/Behavioral: The patient does not have insomnia.         Objective:     Physical Exam:  Constitutional: Patient is oriented to person, place, and time. Appears well-developed and well-nourished.   HENT:   Head: Normocephalic and atraumatic.   Eyes: Conjunctivae and EOM are normal. Pupils are equal, round, and reactive to light.   Cardiovascular: Normal rate, regular rhythm and normal heart sounds.  Exam reveals no gallop and no friction rub.    No murmur  heard.  Pulmonary/Chest: Effort normal and breath sounds normal. Has no wheezes.   Musculoskeletal: Normal range of motion. Exhibits no edema or tenderness.   Neurological: Patient is alert and oriented to person, place, and time.   Psychiatric: Patient has a normal mood and affect.        Assessment/Plan:       1. Cough  - Sofia(TM)SARS COVID-19 Antigen FIA POCT  - COVID-19 (SARS-CoV-2); Future  - guaiFENesin-codeine (ROBITUSSIN AC) 100-10 MG/5ML syrup; Take 5 mLs by mouth 3 (three) times daily as needed for Cough  Dispense: 118 mL; Refill: 0        Zorita Pang, MD

## 2021-03-10 NOTE — Addendum Note (Signed)
Addended by: Johney Maine on: 03/10/2021 04:18 PM     Modules accepted: Orders

## 2021-03-10 NOTE — Progress Notes (Signed)
Have you seen any specialists/other providers since your last visit with [place]?    Yes Cardiologist    Arm preference verified?   Yes    The patient is due for spirometry and shingles vaccine

## 2021-03-11 ENCOUNTER — Telehealth (INDEPENDENT_AMBULATORY_CARE_PROVIDER_SITE_OTHER): Payer: Self-pay | Admitting: Family Medicine

## 2021-03-11 LAB — COVID-19 (SARS-COV-2): SARS CoV 2 Overall Result: NOT DETECTED

## 2021-03-11 LAB — IHS AMB POCT SOFIA (TM) SARS CORONAVIRUS ANTIGEN FIA: Sofia SARS COV2 Antigen POCT: POSITIVE — AB

## 2021-03-11 NOTE — Telephone Encounter (Signed)
Hi Thahani, Patient called and need some advise about his positive covid .  Please check and advise.  Thank you,  CT

## 2021-03-11 NOTE — Telephone Encounter (Signed)
Called pt. Pt wants to know how long he needs to quarantine. Pt was advised to quarantine for 5 days as per the CDC guidelines, and also if symptoms gets worse he is advised to go to the URGENT CARE. Pt verbalized understanding

## 2021-03-12 ENCOUNTER — Ambulatory Visit (INDEPENDENT_AMBULATORY_CARE_PROVIDER_SITE_OTHER): Payer: Commercial Managed Care - POS | Admitting: Family Medicine

## 2021-03-14 ENCOUNTER — Ambulatory Visit (INDEPENDENT_AMBULATORY_CARE_PROVIDER_SITE_OTHER): Payer: Commercial Managed Care - POS | Admitting: Family Medicine

## 2021-03-26 ENCOUNTER — Encounter (INDEPENDENT_AMBULATORY_CARE_PROVIDER_SITE_OTHER): Payer: Self-pay | Admitting: Family Medicine

## 2021-04-04 ENCOUNTER — Ambulatory Visit (INDEPENDENT_AMBULATORY_CARE_PROVIDER_SITE_OTHER): Payer: Commercial Managed Care - POS | Admitting: Family Medicine

## 2021-04-08 ENCOUNTER — Other Ambulatory Visit (INDEPENDENT_AMBULATORY_CARE_PROVIDER_SITE_OTHER): Payer: Self-pay | Admitting: Family Medicine

## 2021-04-08 DIAGNOSIS — E119 Type 2 diabetes mellitus without complications: Secondary | ICD-10-CM

## 2021-04-08 NOTE — Telephone Encounter (Signed)
requested refill(s):    Insulin Glargine ADMINISTER 50 UNITS UNDER THE SKIN AT BEDTIME    requested pharmacy in BOLD):      [facility] #[id number] [place], [place] - [address] AT [facility] OF [address]  [address]  [place] [address]  Phone: [phone] Fax: [phone]    [facility] #[id number] - [place], [place] - [address] AT [facility] [address]  [address]  [place] [address]  Phone: [phone] Fax: [phone]    preferred call back number:    Mobile:   Telephone Information:   Mobile [phone]         PT IS OUT OF MEDICATION AND NEEDS URGENT REFILL>

## 2021-04-14 DIAGNOSIS — Z95810 Presence of automatic (implantable) cardiac defibrillator: Secondary | ICD-10-CM

## 2021-04-18 ENCOUNTER — Ambulatory Visit (INDEPENDENT_AMBULATORY_CARE_PROVIDER_SITE_OTHER): Payer: Commercial Managed Care - POS | Admitting: Family Medicine

## 2021-04-18 ENCOUNTER — Encounter (INDEPENDENT_AMBULATORY_CARE_PROVIDER_SITE_OTHER): Payer: Self-pay | Admitting: Family Medicine

## 2021-04-18 VITALS — BP 108/70 | HR 75 | Temp 97.9°F | Resp 20 | Wt 209.4 lb

## 2021-04-18 DIAGNOSIS — E7800 Pure hypercholesterolemia, unspecified: Secondary | ICD-10-CM

## 2021-04-18 DIAGNOSIS — E0800 Diabetes mellitus due to underlying condition with hyperosmolarity without nonketotic hyperglycemic-hyperosmolar coma (NKHHC): Secondary | ICD-10-CM

## 2021-04-18 DIAGNOSIS — E119 Type 2 diabetes mellitus without complications: Secondary | ICD-10-CM

## 2021-04-18 DIAGNOSIS — I10 Essential (primary) hypertension: Secondary | ICD-10-CM

## 2021-04-18 DIAGNOSIS — Z794 Long term (current) use of insulin: Secondary | ICD-10-CM

## 2021-04-18 DIAGNOSIS — J42 Unspecified chronic bronchitis: Secondary | ICD-10-CM

## 2021-04-18 DIAGNOSIS — E118 Type 2 diabetes mellitus with unspecified complications: Secondary | ICD-10-CM

## 2021-04-18 MED ORDER — LANTUS SOLOSTAR 100 UNIT/ML SC SOPN
PEN_INJECTOR | SUBCUTANEOUS | 3 refills | Status: DC
Start: 2021-04-18 — End: 2022-09-09

## 2021-04-18 MED ORDER — GLUCOSE BLOOD VI STRP
1.0000 | ORAL_STRIP | Freq: Two times a day (BID) | 1 refills | Status: DC
Start: 2021-04-18 — End: 2022-09-09

## 2021-04-18 NOTE — Progress Notes (Signed)
Subjective:      Date: 04/18/2021 4:09 PM   Patient ID: [name] [name] is a 75 y.o. male.    Chief Complaint:  Chief Complaint   Patient presents with    Diabetes     Follow up.       HPI:  HPI    HTN: Hypertension is well controlled at this point.  Patient is checking his blood pressure regularly at least 3-4 times a week.  Blood pressure at home is always in normal range.  Patient avoids adding salt and is doing regular cardiac exercise.   Average 120/70    Hyperlipidemia : Patient is compliant with current treatment for hyperlipidemia.  Is watching type of food and amount of calorie intake.  Tries to get with the weight into a BMI range of 25, is involved in regular exercising with cardiac workouts.      DM : Patient is compliant with current medication.  Tries to exercise at least 3 times a week.  Watches type of diet and amount of carbohydrate intake.  Check lab work for diabetes regularly in our clinic.  Average 120      Problem List:  Patient Active Problem List   Diagnosis    Chest pain    Type 2 diabetes mellitus with complication, with long-term current use of insulin    Heart attack    CAD (coronary artery disease)    Non-ischemic cardiomyopathy    Acute exacerbation of CHF (congestive heart failure)    PUD (peptic ulcer disease)    Elevated LFTs    Acute dyspnea    Elevated d-dimer    CHF (congestive heart failure)    Headaches due to old head injury    Essential hypertension    Pneumonia due to infectious organism, unspecified laterality, unspecified part of lung    Syncope and collapse    AICD (automatic cardioverter/defibrillator) present    Syncope, unspecified syncope type    Pneumonia due to infectious organism    Dyspnea, unspecified type    Cardiomyopathy    Cough    Sepsis    Sepsis, due to unspecified organism    SOB (shortness of breath)    History of colon polyps    GERD (gastroesophageal reflux disease)    Hypertensive heart disease without heart failure    Atherosclerotic heart disease  of native coronary artery without angina pectoris    Heart failure, unspecified    Nonischemic cardiomyopathy    BO (blackout)    Belching    Acute subendocardial infarction, subsequent episode of care    Difficulty sleeping    Dizziness    Encounter for examination for normal comparison and control in clinical research program    Fatigue    Nasal congestion    Paroxysmal ventricular tachycardia    Pure hypercholesterolemia    Tachycardia, unspecified    Chronic systolic congestive heart failure    Encounter for implantable defibrillator reprogramming or check    Episode of recurrent major depressive disorder, unspecified depression episode severity    Stage 3 chronic kidney disease, 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    Anxiety and depression    COPD (chronic obstructive pulmonary disease)    HLD (hyperlipidemia)       Current Medications:  Outpatient Medications Marked as Taking for the 04/18/21 encounter (Office Visit) with Zorita Pang, MD   Medication Sig Dispense Refill    ALPRAZolam (XANAX) 0.25 MG tablet Take 0.25 mg by mouth nightly  as needed      aspirin EC 81 MG EC tablet Take 81 mg by mouth daily      atorvastatin (LIPITOR) 40 MG tablet Take 1 tablet (40 mg total) by mouth nightly 90 tablet 0    carvedilol (COREG) 25 MG tablet Take 1 tablet (25 mg total) by mouth 2 (two) times daily with meals 180 tablet 3    docusate sodium (COLACE) 100 MG capsule Take by mouth daily      empagliflozin (JARDIANCE) 10 MG tablet Take 1 tablet (10 mg total) by mouth daily 90 tablet 3    fluticasone furoate-vilanterol (Breo Ellipta) 100-25 MCG/INH Aerosol Pwdr, Breath Activated Inhale 1 puff into the lungs daily 3 each 3    guaiFENesin-codeine (ROBITUSSIN AC) 100-10 MG/5ML syrup Take 5 mLs by mouth 3 (three) times daily as needed for Cough 118 mL 0    niacin (NIASPAN) 500 MG CR tablet TAKE 1 TABLET BY MOUTH TWICE DAILY 180 tablet 3    PARoxetine (Paxil) 20 MG tablet Take 1 tablet (20 mg total) by mouth every morning 30  tablet 11    sacubitril-valsartan (Entresto) 97-103 MG Tab per tablet Take 1 tablet by mouth 2 (two) times daily 180 tablet 3    SITagliptin-metFORMIN (Janumet) 50-1000 MG tablet Take 1 tablet by mouth 2 (two) times daily with meals 180 tablet 3    spironolactone (ALDACTONE) 25 MG tablet Take 0.5 tablets (12.5 mg total) by mouth daily 45 tablet 3    tamsulosin (FLOMAX) 0.4 MG Cap TAKE 1 CAPSULE(0.4 MG) BY MOUTH DAILY AFTER DINNER 90 capsule 3    torsemide (Demadex) 20 MG tablet Take 2 tablets (40 mg total) by mouth every morning 90 tablet 0    [DISCONTINUED] glucose blood (ONE TOUCH ULTRA TEST) test strip 1 each by Other route 2 (two) times daily Use as instructed 300 each 1    [DISCONTINUED] insulin glargine (LANTUS SOLOSTAR) 100 UNIT/ML injection pen ADMINISTER 50 UNITS UNDER THE SKIN AT BEDTIME 45 mL 3       Allergies:  No Known Allergies    Past Medical History:  Past Medical History:   Diagnosis Date    Acute CHF     NOV  & DEC 2018, - DIALYSIS CATH PLACED Aug 24 2017 TO REMOVE FLUID     Acute systolic (congestive) heart failure 08/2017    Anxiety     CAD (coronary artery disease)     Cardiomyopathy     nonischemic    CHF (congestive heart failure) 08/12/2014, 2013    Chronic obstructive pulmonary disease     POSSIBLE PER PT HE USES  SYNBICORT BID ABD  BREO INHALER PRN    Coronary artery disease 2011    CORONARY STENT PLACEMENT FOLLOWED BY [facility]    Depression     echocardiogram 02/2016, 11/2016, 09/2017, 12/20/2017    GERD (gastroesophageal reflux disease)     Heart attack 2011    and 2014    Hyperlipemia     Hypertension     ICD (implantable 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     PLACED 2014  [facility]  LAST INTERROGATION April 01 2018 REPORT REQUESTED    Ischemic cardiomyopathy     EF 15% ON ECHO 09-20-2017 DR GARG IN EPIC    Nonischemic cardiomyopathy     NSTEMI (non-ST elevated myocardial infarction)     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    Pacemaker 2014    MEDTRONIC ICD/PACEMAKER COMBO LAST INTERROGATION  12-12 2018  Pneumonia 09/2016    Primary cardiomyopathy     Ischemic cardiomyopathy EF 15% ON ECHO 09-20-2017 DR GARG IN EPIC    Sepsis 08/2017    Syncope and collapse     PRIOR TO ICD/PACEMAKER PLACED 2014    Type 2 diabetes mellitus, controlled DX 1998    BS  AVG 100  A1C 7.4 Dec 27 2017    Wheeze     PT SEE HIS PMD April 01 2018 RE THIS-TOLD TO SEE PMD BY [facility] JUNE 12 WHEN THEY SAW HIM FOR A CL       Past Surgical History:  Past Surgical History:   Procedure Laterality Date    BIV  11/10/2016    ICD METRONIC  UPGRADE     CARDIAC CATHETERIZATION  03/2010    CORONARY STENT PLACEMENT X 2 PER PT; LM normal, LAD 70-80% distal lesion at apex, 20% lesion mid CFX    CARDIAC CATHETERIZATION  12/2012    CARDIAC DEFIBRILLATOR PLACEMENT  12/2012    [facility]    CARDIAC PACEMAKER PLACEMENT  2014    MEDTRONIC ICD/PACEMAKER PLACED  HEART    CIRCUMCISION  AGE 32    COLONOSCOPY  2009    CORRECTION HAMMER TOE      duodenal ulcer  1973    STRESS RELATED IN SCHOOL    ECHOCARDIOGRAM, TRANSTHORACIC  02/2016 11/2016 09/2017 12/2017    ECHOCARDIOGRAM, TRANSTHORACIC  02/2016,11/2016,12/20/2017    EGD N/A 08/15/2014    Procedure: EGD;  Surgeon: Colon Branch, MD;  Location: [facility];  Service: Gastroenterology;  Laterality: N/A;  egd w/ bx    EGD  1975    EGD, COLONOSCOPY N/A 04/04/2018    Procedure: EGD with bxs, COLONOSCOPY with polypectomy and clipping;  Surgeon: Doyne Keel, MD;  Location: [facility];  Service: Gastroenterology;  Laterality: N/A;    FRACTURE SURGERY  AGE 89     RT  ANKLE- CAST APPLIED    HERNIA REPAIR  AGE 32    LEFT INGUINAL HERNIA    MPI nuclear study  06/2016    orthopedic surgery  AGE 50    RT foot corrective - HAMMERTOE    TONSILLECTOMY AND ADENOIDECTOMY  1954       Family History:  Family History   Problem Relation Age of Onset    Breast cancer Mother     Heart attack Mother 20    Hypertension Mother     Diabetes Mother     Coronary artery disease Mother     Diabetes Brother     Heart attack  Father     Hypertension Father        Social History:  Social History     Socioeconomic History    Marital status: Married     Spouse name: Teacher, music    Number of children: 0   Occupational History    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, history    Tobacco Use    Smoking status: Never    Smokeless tobacco: Never   Vaping Use    Vaping Use: Never used   Substance and Sexual Activity    Alcohol use: No     Alcohol/week: 0.0 standard drinks     Comment: occasionally    Drug use: No    Sexual activity: Yes     Partners: Female        The following sections were reviewed this encounter by the provider:   Tobacco  Allergies  Meds  Problems  Med Hx  Surg Hx  Fam Hx           Vitals:  BP 108/70 (BP Site: Right arm, Patient Position: Sitting, Cuff Size: Medium)   Pulse 75   Temp 97.9 F (36.6 C) (Temporal)   Resp 20   Wt 95 kg (209 lb 6.4 oz)   SpO2 95%   BMI 28.40 kg/m     ROS:  Constitutional: Negative for fever, chills and malaise/fatigue.   Respiratory: Negative for cough, shortness of breath and wheezing.    Cardiovascular: Negative for chest pain and palpitations.   Gastrointestinal: Negative for abdominal pain, diarrhea and constipation.   Musculoskeletal: Negative for joint pain.   Skin: Negative for rash.   Neurological: Negative for dizziness and headaches.   Psychiatric/Behavioral: The patient does not have insomnia.         Objective:     Physical Exam:  Constitutional: Patient is oriented to person, place, and time. Appears well-developed and well-nourished.   HENT:   Head: Normocephalic and atraumatic.   Eyes: Conjunctivae and EOM are normal. Pupils are equal, round, and reactive to light.   Cardiovascular: Normal rate, regular rhythm and normal heart sounds.  Exam reveals no gallop and no friction rub.    No murmur heard.  Pulmonary/Chest: Effort normal and breath sounds normal. Has no wheezes.   Musculoskeletal: Normal range of motion. Exhibits no edema or tenderness.   Neurological: Patient is alert  and oriented to person, place, and time.   Psychiatric: Patient has a normal mood and affect.        Assessment/Plan:       1. Essential hypertension  - CBC and differential; Future  - Comprehensive metabolic panel; Future  Patient symptoms are well controlled under current treatment.  Recommend to continue medication as prescribed.      2. Type 2 diabetes mellitus with complication, with long-term current use of insulin  - Hemoglobin A1C; Future  - Urine Microalbumin Random; Future  Patient symptoms are well controlled under current treatment.  Recommend to continue medication as prescribed.      3. Pure hypercholesterolemia  - Lipid panel; Future  Patient symptoms are well controlled under current treatment.  Recommend to continue medication as prescribed.      4. Chronic bronchitis, unspecified chronic bronchitis type    5. Diabetes mellitus due to underlying condition with hyperosmolarity without coma, without long-term current use of insulin  - glucose blood (ONE TOUCH ULTRA TEST) test strip; 1 each by Other route 2 (two) times daily Use as instructed  Dispense: 300 each; Refill: 1    6. Type 2 diabetes mellitus without complication, with long-term current use of insulin  - insulin glargine (LANTUS SOLOSTAR) 100 UNIT/ML injection pen; Patient takes 50 units at night  Dispense: 45 mL; Refill: 3      Zorita Pang, MD

## 2021-04-18 NOTE — Progress Notes (Signed)
Have you seen any specialists/other providers since your last visit with [place]?    No    Arm preference verified?   Yes    The patient is due for spirometry, shingles vaccine, and Tetanus Booster.

## 2021-04-24 ENCOUNTER — Encounter (INDEPENDENT_AMBULATORY_CARE_PROVIDER_SITE_OTHER): Payer: Self-pay | Admitting: Cardiovascular Disease

## 2021-04-24 ENCOUNTER — Ambulatory Visit (INDEPENDENT_AMBULATORY_CARE_PROVIDER_SITE_OTHER): Payer: Commercial Managed Care - POS | Admitting: Cardiovascular Disease

## 2021-04-24 DIAGNOSIS — I428 Other cardiomyopathies: Secondary | ICD-10-CM

## 2021-04-24 DIAGNOSIS — I5022 Chronic systolic (congestive) heart failure: Secondary | ICD-10-CM

## 2021-04-24 MED ORDER — EMPAGLIFLOZIN 10 MG PO TABS
10.0000 mg | ORAL_TABLET | Freq: Every day | ORAL | 3 refills | Status: DC
Start: 2021-04-24 — End: 2022-06-05

## 2021-04-24 MED ORDER — SPIRONOLACTONE 25 MG PO TABS
12.5000 mg | ORAL_TABLET | Freq: Every day | ORAL | 3 refills | Status: DC
Start: 2021-04-24 — End: 2022-03-20

## 2021-04-24 MED ORDER — CARVEDILOL 25 MG PO TABS
25.0000 mg | ORAL_TABLET | Freq: Two times a day (BID) | ORAL | 3 refills | Status: DC
Start: 2021-04-24 — End: 2022-03-20

## 2021-04-24 NOTE — Progress Notes (Signed)
[place] [facility] ADVANCED HEART FAILURE SUB-SPECIALTY [NEW, Follow Up] CONSULTATION NOTE    HRT [facility] OFFICE -CARDIOLOGY  [address]  [place] [address]  Dept: [phone]  Dept Fax: [phone]     Patient Name: [name] [name]    Date of Visit:  April 24, 2021  Date of Birth: 05-17-1946  AGE: 75 y.o.  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 today as a follow up visit in the Advanced Heart Failure Clinic at [facility]. He is a pleasant 75 y.o. 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  Is noted that his ejection fraction was 15% on echocardiogram in May 2021.  He has grade 3 diastolic dysfunction and mild MR and TR.  He has a Medtronic BiV ICD.  His Coreg 25 mg twice daily, Entresto 97/103 mg twice daily, and Spironolactone 12.5 mg daily.  He is on torsemide 40 mg twice daily.     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     He reports increased shortness of breath during the last 3 to 4 days.  His weight has increased between visits.  He previously weighed 205 pounds now he weighs 224 pounds.  There is been no missed medications or significant change in diet.    PAST MEDICAL HISTORY: He has a past medical history of Acute CHF, Acute systolic (congestive) heart failure (08/2017), Anxiety, CAD (coronary artery disease), Cardiomyopathy, CHF (congestive heart failure) (08/12/2014, 2013), Chronic obstructive pulmonary disease, Coronary artery disease (2011), Depression, echocardiogram (02/2016, 11/2016, 09/2017, 12/20/2017), GERD (gastroesophageal reflux disease), Heart attack (2011), Hyperlipemia, Hypertension, ICD (implantable 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,  Ischemic cardiomyopathy, Nonischemic cardiomyopathy, NSTEMI (non-ST elevated myocardial infarction), 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 He has a past surgical history that includes duodenal ulcer (1973); orthopedic surgery (AGE 62); EGD (N/A, 08/15/2014); Circumcision (AGE 65); Fracture surgery (AGE 29); Hernia repair (AGE 65); Colonoscopy (2009); Tonsillectomy and adenoidectomy (1954); Cardiac defibrillator placement (12/2012); Cardiac pacemaker placement (2014); EGD (1975); EGD, COLONOSCOPY (N/A, 04/04/2018); ECHOCARDIOGRAM, TRANSTHORACIC (02/2016 11/2016 09/2017 12/2017); MPI nuclear study (06/2016); Correction hammer toe; BIV (11/10/2016); ECHOCARDIOGRAM, TRANSTHORACIC (02/2016,11/2016,12/20/2017); Cardiac catheterization (03/2010); and Cardiac catheterization (12/2012).    ALLERGIES: No Known Allergies    MEDICATIONS:   Current Outpatient Medications:     ALPRAZolam (XANAX) 0.25 MG tablet, Take 0.25 mg by mouth nightly as needed, Disp: , Rfl:     aspirin EC 81 MG EC tablet, Take 81 mg by mouth daily, Disp: , Rfl:     atorvastatin (LIPITOR) 40 MG tablet, Take 1 tablet (40 mg total) by mouth nightly, Disp: 90 tablet, Rfl: 0    docusate sodium (COLACE) 100 MG capsule, Take by mouth daily, Disp: , Rfl:     fluticasone furoate-vilanterol (Breo Ellipta) 100-25 MCG/INH Aerosol Pwdr, Breath Activated, Inhale 1 puff into the lungs daily, Disp: 3 each, Rfl: 3    glucose blood (ONE TOUCH ULTRA TEST) test strip, 1 each by Other route 2 (two) times daily Use as instructed, Disp: 300 each, Rfl: 1    insulin glargine (LANTUS SOLOSTAR) 100 UNIT/ML injection pen, Patient takes 50  units at night, Disp: 45 mL, Rfl: 3    niacin (NIASPAN) 500 MG CR tablet, TAKE 1 TABLET BY MOUTH TWICE DAILY, Disp: 180 tablet, Rfl: 3    PARoxetine (Paxil) 20 MG tablet, Take 1 tablet (20 mg total) by mouth  every morning, Disp: 30 tablet, Rfl: 11    sacubitril-valsartan (Entresto) 97-103 MG Tab per tablet, Take 1 tablet by mouth 2 (two) times daily, Disp: 180 tablet, Rfl: 3    Semglee, yfgn, 100 UNIT/ML Solution Pen-injector, ADMINISTER 50 UNITS UNDER THE SKIN AT BEDTIME, Disp: , Rfl:     SITagliptin-metFORMIN (Janumet) 50-1000 MG tablet, Take 1 tablet by mouth 2 (two) times daily with meals, Disp: 180 tablet, Rfl: 3    tamsulosin (FLOMAX) 0.4 MG Cap, TAKE 1 CAPSULE(0.4 MG) BY MOUTH DAILY AFTER DINNER, Disp: 90 capsule, Rfl: 3    torsemide (Demadex) 20 MG tablet, Take 2 tablets (40 mg total) by mouth every morning, Disp: 90 tablet, Rfl: 0    carvedilol (COREG) 25 MG tablet, Take 1 tablet (25 mg total) by mouth 2 (two) times daily with meals, Disp: 180 tablet, Rfl: 3    empagliflozin (JARDIANCE) 10 MG tablet, Take 1 tablet (10 mg total) by mouth daily, Disp: 90 tablet, Rfl: 3    guaiFENesin-codeine (ROBITUSSIN AC) 100-10 MG/5ML syrup, Take 5 mLs by mouth 3 (three) times daily as needed for Cough, Disp: 118 mL, Rfl: 0    spironolactone (ALDACTONE) 25 MG tablet, Take 0.5 tablets (12.5 mg total) by mouth daily, Disp: 45 tablet, Rfl: 3     FAMILY HISTORY: family history includes Breast cancer in his mother; Coronary artery disease in his mother; Diabetes in his brother and mother; Heart attack in his father; Heart attack (age of onset: 40) in his mother; Hypertension in his father and mother.    SOCIAL HISTORY: He reports that he has never smoked. He has never used smokeless tobacco. He reports that he does not drink alcohol and does not use drugs.    PHYSICAL EXAMINATION  Visit Vitals  Blood Pressure 118/78 (BP Site: Left arm, Patient Position: Sitting)   Pulse 83   Height 1.829 m (6')   Weight 93.2 kg (205 lb 6.4 oz)   Body Mass Index 27.86 kg/m     Last 3 Weights:   Weight Monitoring 03/10/2021 04/18/2021 04/24/2021   Height - - 182.9 cm   Height Method - - -   Weight 97.07 kg 94.983 kg 93.169 kg   Weight Method - - -   BMI  (calculated) - - 27.9 kg/m2       General Appearance:  A well-appearing male in no acute distress.    Skin: Warm and dry to touch, no apparent skin lesions  Head: Normocephalic, normal hair pattern  Eyes: EOMS Intact, conjunctivae and lids unremarkable.    ENT: Ears, Nose and throat reveal no gross abnormalities.  No pallor or cyanosis.   Neck: JVP is [normal, elevated]   Chest: Clear to auscultation bilaterally with good air movement and respiratory effort and no wheezes, rales, or rhonchi   Cardiovascular: Regular rhythm, S1 normal, S2 normal, No S3 or S4. Apical impulse not displaced. No murmur. No gallops or rubs detected   Abdomen: Soft, nontender, nondistended, with normoactive bowel sounds. No organomegaly.    Extremities: Warm [without edema]. No clubbing, or cyanosis. [Radial] pulses are full and equal.   Neuro: Alert and oriented x3. No gross motor or sensory deficits noted, affect appropriate.  LABS:   Lab Results   Component Value Date    WBC 16.13 (H) 02/18/2021    HGB 12.5 02/18/2021    HCT 38.5 02/18/2021    PLT 176 02/18/2021     Lab Results   Component Value Date    GLU 115 (H) 03/06/2021    BUN 17.0 03/06/2021    CREAT 1.5 03/06/2021    NA 138 03/06/2021    K 5.1 03/06/2021    CL 109 03/06/2021    CO2 21 03/06/2021    CA 8.8 03/06/2021    PROT 6.2 02/18/2021    AST 15 02/18/2021    ALT 22 02/18/2021    BILITOTAL 0.9 02/18/2021    GLOB 3.1 02/18/2021    ALB 3.1 (L) 02/18/2021     Lab Results   Component Value Date    TSH 0.74 08/12/2014    CK 152 02/17/2021    BNP 231.4 (H) 02/19/2021    BNP 1,354.5 (H) 02/17/2021    BNP 956.2 (H) 09/18/2017    HGBA1C 7.2 (H) 01/13/2021     Lab Results   Component Value Date    CHOL 185 01/13/2021    CHOL 119 11/30/2019    TRIG 96 01/13/2021    TRIG 85 11/30/2019    HDL 36 (L) 01/13/2021    HDL 37 (L) 11/30/2019    LDL 130 (H) 01/13/2021    LDL 65 11/30/2019    LABVLDL 19 01/13/2021    LABVLDL 17 11/30/2019    CHOLHDLRATIO 5.1 01/13/2021    CHOLHDLRATIO 3.2  11/30/2019     IMPRESSION / RECOMMENDATIONS:   [name] a 75 y.o.male with chronic systolic heart failure due to nonischemic cardiomyopathy, ACC/AHA stage C, NYHA class 3. Today he is feeling well and is euvolemic on exam.  The last LVEF was 15% demonstrated by echocardiogram in May 2021 and 20-25% in July 2021.     2   Goal directed therapy includes:  Beta blocker: Carvedilol 25 mg twice daily  ACE/ARB/ARNI: Entresto 97/103 mg twice daily  MRA: Spironolactone 12.5 mg daily  SGLT2i:  jardianc 10mg  daily     3   Current diuretic regimen: torsemide 60 mg in the morning and 40 mg in the afternoon   next week to check on his symptoms after having taken metolazone. [phone])     Echocardigoram and sleep study ordered     Next follow up will be with Conception Oms, NP in 3 to 4 weeks..                                               Orders Placed This Encounter   Procedures    Echocardiogram Adult Complete W Clr/ Dopp Waveform    Sleep Consult (HRT [place])    ADVANCED HEART FAILURE OFFICE VISIT (HRT [place])       Orders Placed This Encounter   Medications    carvedilol (COREG) 25 MG tablet     Sig: Take 1 tablet (25 mg total) by mouth 2 (two) times daily with meals     Dispense:  180 tablet     Refill:  3    empagliflozin (JARDIANCE) 10 MG tablet     Sig: Take 1 tablet (10 mg total) by mouth daily     Dispense:  90 tablet     Refill:  3  spironolactone (ALDACTONE) 25 MG tablet     Sig: Take 0.5 tablets (12.5 mg total) by mouth daily     Dispense:  45 tablet     Refill:  3       SIGNED:    Marianna Fuss, MD    This note was generated by the Dragon speech recognition and may contain errors or omissions not intended by the user. Grammatical errors, random word insertions, deletions, pronoun errors, and incomplete sentences are occasional consequences of this technology due to software limitations. Not all errors are caught or corrected. If there are questions or concerns about the content of this note or information  contained within the body of this dictation, they should be addressed directly with the author for clarification.

## 2021-04-25 ENCOUNTER — Other Ambulatory Visit: Payer: Commercial Managed Care - POS

## 2021-05-05 ENCOUNTER — Other Ambulatory Visit (FREE_STANDING_LABORATORY_FACILITY): Payer: Commercial Managed Care - POS

## 2021-05-05 ENCOUNTER — Other Ambulatory Visit (INDEPENDENT_AMBULATORY_CARE_PROVIDER_SITE_OTHER): Payer: Commercial Managed Care - POS | Admitting: Cardiovascular Disease

## 2021-05-05 DIAGNOSIS — E118 Type 2 diabetes mellitus with unspecified complications: Secondary | ICD-10-CM

## 2021-05-05 DIAGNOSIS — I10 Essential (primary) hypertension: Secondary | ICD-10-CM

## 2021-05-05 DIAGNOSIS — Z794 Long term (current) use of insulin: Secondary | ICD-10-CM

## 2021-05-05 DIAGNOSIS — E7800 Pure hypercholesterolemia, unspecified: Secondary | ICD-10-CM

## 2021-05-05 LAB — MAN DIFF ONLY
Atypical Lymphocytes %: 16 %
Atypical Lymphocytes Absolute: 6.09 10*3/uL — ABNORMAL HIGH (ref 0.00–0.00)
Band Neutrophils Absolute: 0 10*3/uL (ref 0.00–1.00)
Band Neutrophils: 0 %
Basophils Absolute Manual: 0 10*3/uL (ref 0.00–0.08)
Basophils Manual: 0 %
Eosinophils Absolute Manual: 0 10*3/uL (ref 0.00–0.44)
Eosinophils Manual: 0 %
Lymphocytes Absolute Manual: 30.85 10*3/uL — ABNORMAL HIGH (ref 0.42–3.22)
Lymphocytes Manual: 81 %
Monocytes Absolute: 0 10*3/uL — ABNORMAL LOW (ref 0.21–0.85)
Monocytes Manual: 0 %
Neutrophils Absolute Manual: 1.14 10*3/uL (ref 1.10–6.33)
Segmented Neutrophils: 3 %

## 2021-05-05 LAB — COMPREHENSIVE METABOLIC PANEL
ALT: 10 U/L (ref 0–55)
AST (SGOT): 16 U/L (ref 5–34)
Albumin/Globulin Ratio: 1.4 (ref 0.9–2.2)
Albumin: 4 g/dL (ref 3.5–5.0)
Alkaline Phosphatase: 79 U/L (ref 37–117)
Anion Gap: 9 (ref 5.0–15.0)
BUN: 20 mg/dL (ref 9.0–28.0)
Bilirubin, Total: 0.9 mg/dL (ref 0.2–1.2)
CO2: 21 mEq/L (ref 21–29)
Calcium: 9.2 mg/dL (ref 7.9–10.2)
Chloride: 106 mEq/L (ref 100–111)
Creatinine: 1.5 mg/dL (ref 0.5–1.5)
Globulin: 2.8 g/dL (ref 2.0–3.6)
Glucose: 70 mg/dL (ref 70–100)
Potassium: 4.9 mEq/L (ref 3.5–5.1)
Protein, Total: 6.8 g/dL (ref 6.0–8.3)
Sodium: 136 mEq/L (ref 136–145)

## 2021-05-05 LAB — CBC AND DIFFERENTIAL
Absolute NRBC: 0 10*3/uL (ref 0.00–0.00)
Hematocrit: 44.6 % (ref 37.6–49.6)
Hgb: 14 g/dL (ref 12.5–17.1)
MCH: 32.4 pg (ref 25.1–33.5)
MCHC: 31.4 g/dL — ABNORMAL LOW (ref 31.5–35.8)
MCV: 103.2 fL — ABNORMAL HIGH (ref 78.0–96.0)
MPV: 13.6 fL — ABNORMAL HIGH (ref 8.9–12.5)
Nucleated RBC: 0 /100 WBC (ref 0.0–0.0)
Platelets: 111 10*3/uL — ABNORMAL LOW (ref 142–346)
RBC: 4.32 10*6/uL (ref 4.20–5.90)
RDW: 15 % (ref 11–15)
WBC: 38.09 10*3/uL (ref 3.10–9.50)

## 2021-05-05 LAB — LIPID PANEL
Cholesterol / HDL Ratio: 3.9 Index
Cholesterol: 149 mg/dL (ref 0–199)
HDL: 38 mg/dL — ABNORMAL LOW (ref 40–9999)
LDL Calculated: 97 mg/dL (ref 0–99)
Triglycerides: 71 mg/dL (ref 34–149)
VLDL Calculated: 14 mg/dL (ref 10–40)

## 2021-05-05 LAB — MICROALBUMIN, RANDOM URINE
Urine Creatinine, Random: 82 mg/dL
Urine Microalbumin, Random: 68 ug/ml — ABNORMAL HIGH (ref 0.0–30.0)
Urine Microalbumin/Creatinine Ratio: 83 ug/mg — ABNORMAL HIGH (ref 0–30)

## 2021-05-05 LAB — CELL MORPHOLOGY
Cell Morphology: ABNORMAL — AB
Platelet Estimate: DECREASED — AB

## 2021-05-05 LAB — REMOTE CARDIAC DEVICE MONITORING
AF Burden Percentage: 0
ICD Shock Recent Count: 0
RV Pacing Percentage: 6.03

## 2021-05-05 LAB — GFR: EGFR: 55.3

## 2021-05-05 LAB — HEMOLYSIS INDEX: Hemolysis Index: 11 Index (ref 0–24)

## 2021-05-05 LAB — HEMOGLOBIN A1C
Average Estimated Glucose: 122.6 mg/dL
Hemoglobin A1C: 5.9 % (ref 4.6–5.9)

## 2021-05-06 ENCOUNTER — Telehealth (INDEPENDENT_AMBULATORY_CARE_PROVIDER_SITE_OTHER): Payer: Self-pay | Admitting: Internal Medicine

## 2021-05-06 NOTE — Telephone Encounter (Signed)
On Call Note:   Late entry. Called by lab last night for WBC 38.09. Was unable to place telephone enc at the time. Sent PCP secure chat until telephone enc was able to be placed.     Gust Brooms, MD .

## 2021-05-07 NOTE — Progress Notes (Signed)
Called pt wife number. Wife handed over the call to the pt. Informed pt about the urgency of the recent lab result.  Pt agreed to follow up with his Oncologist/ Hematologist.

## 2021-05-07 NOTE — Progress Notes (Signed)
Tried calling pt . Left voice message to call back.

## 2021-05-09 LAB — CBC PATHOLOGIST REVIEW

## 2021-05-29 ENCOUNTER — Ambulatory Visit (INDEPENDENT_AMBULATORY_CARE_PROVIDER_SITE_OTHER): Payer: Commercial Managed Care - POS

## 2021-05-29 DIAGNOSIS — I5022 Chronic systolic (congestive) heart failure: Secondary | ICD-10-CM

## 2021-05-29 DIAGNOSIS — I428 Other cardiomyopathies: Secondary | ICD-10-CM

## 2021-05-30 ENCOUNTER — Encounter (INDEPENDENT_AMBULATORY_CARE_PROVIDER_SITE_OTHER): Payer: Self-pay | Admitting: Cardiovascular Disease

## 2021-06-04 ENCOUNTER — Ambulatory Visit (INDEPENDENT_AMBULATORY_CARE_PROVIDER_SITE_OTHER): Payer: Commercial Managed Care - POS | Admitting: Sleep Medicine

## 2021-06-04 ENCOUNTER — Encounter (INDEPENDENT_AMBULATORY_CARE_PROVIDER_SITE_OTHER): Payer: Self-pay | Admitting: Sleep Medicine

## 2021-06-04 VITALS — BP 115/80 | HR 83 | Ht 73.0 in | Wt 204.0 lb

## 2021-06-04 DIAGNOSIS — G4733 Obstructive sleep apnea (adult) (pediatric): Secondary | ICD-10-CM

## 2021-06-04 DIAGNOSIS — I428 Other cardiomyopathies: Secondary | ICD-10-CM

## 2021-06-04 DIAGNOSIS — I5022 Chronic systolic (congestive) heart failure: Secondary | ICD-10-CM

## 2021-06-04 NOTE — Progress Notes (Signed)
[place] [facility] SLEEP OFFICE CONSULTATION NOTE    HRT [place]  [place] [facility] [place] OFFICE -CARDIOLOGY  [address]  [place] [address]  Dept: [phone]  Dept Fax: [phone]         Patient Name: [name] [name]    Date of Visit:  June 04, 2021  Date of Birth: [birth year]  AGE: 75 y.o.  Medical Record #: [id number]    Referring Physician: Jarrett Soho MD.    CHIEF COMPLAINT:    Chief Complaint   Patient presents with    Consult (Initial)     Sleep apnea        HISTORY OF PRESENT ILLNESS    [name] [name] is being seen today for sleep evaluation at the [facility].   75 year old man, new sleep consultation, referred to [place] from Dr. Webb Silversmith.  Medical history includes chronic combined systolic and diastolic heart failure, nonischemic cardiomyopathy, nonobstructive CAD by cath in 2014, reduced EF 25 to 30% and elevated PASP , by most recent echo in August 2022, now on goal-directed therapy, ultimately sent for evaluation of possible sleep disordered breathing.    Sleep testing about 8 yrs ago, told OSA, never followed up.   Now with significant cardiovascular issues including now status post 4 heart attacks and recent marked reduction in EF in addition to pulmonary hypertension it was strongly suggested he reevaluate.    PAST MEDICAL HISTORY: He has a past medical history of Acute CHF, Acute systolic (congestive) heart failure (08/2017), Anxiety, CAD (coronary artery disease), Cardiomyopathy, CHF (congestive heart failure) (08/12/2014, 2013), Chronic obstructive pulmonary disease, Coronary artery disease (2011), Depression, echocardiogram (02/2016, 11/2016, 09/2017, 12/20/2017), GERD (gastroesophageal reflux disease), Heart attack (2011), Hyperlipemia, Hypertension, ICD (implantable 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, Ischemic cardiomyopathy, Nonischemic cardiomyopathy, NSTEMI (non-ST elevated myocardial infarction), Nuclear MPI (06/2016), Pacemaker (2014),  Pneumonia (09/2016), Primary cardiomyopathy, Sepsis (08/2017), Syncope and collapse, Type 2 diabetes mellitus, controlled (DX 1998), and Wheeze. He has a past surgical history that includes duodenal ulcer (1973); orthopedic surgery (AGE 53); EGD (N/A, 08/15/2014); Circumcision (AGE 18); Fracture surgery (AGE 89); Hernia repair (AGE 18); Colonoscopy (2009); Tonsillectomy and adenoidectomy (1954); Cardiac defibrillator placement (12/2012); Cardiac pacemaker placement (2014); EGD (1975); EGD, COLONOSCOPY (N/A, 04/04/2018); ECHOCARDIOGRAM, TRANSTHORACIC (02/2016 11/2016 09/2017 12/2017); MPI nuclear study (06/2016); Correction hammer toe; BIV (11/10/2016); ECHOCARDIOGRAM, TRANSTHORACIC (02/2016,11/2016,12/20/2017); Cardiac catheterization (03/2010); and Cardiac catheterization (12/2012).    ALLERGIES: No Known Allergies    MEDICATIONS:       Current Outpatient Medications:     ALPRAZolam (XANAX) 0.25 MG tablet, Take 0.25 mg by mouth nightly as needed, Disp: , Rfl:     aspirin EC 81 MG EC tablet, Take 81 mg by mouth daily, Disp: , Rfl:     atorvastatin (LIPITOR) 40 MG tablet, Take 1 tablet (40 mg total) by mouth nightly, Disp: 90 tablet, Rfl: 0    carvedilol (COREG) 25 MG tablet, Take 1 tablet (25 mg total) by mouth 2 (two) times daily with meals, Disp: 180 tablet, Rfl: 3    docusate sodium (COLACE) 100 MG capsule, Take by mouth daily, Disp: , Rfl:     empagliflozin (JARDIANCE) 10 MG tablet, Take 1 tablet (10 mg total) by mouth daily, Disp: 90 tablet, Rfl: 3    fluticasone furoate-vilanterol (Breo Ellipta) 100-25 MCG/INH Aerosol Pwdr, Breath Activated, Inhale 1 puff into the lungs daily, Disp: 3 each, Rfl: 3    glucose blood (ONE TOUCH ULTRA TEST) test strip, 1 each by Other route 2 (two) times daily  Use as instructed, Disp: 300 each, Rfl: 1    guaiFENesin-codeine (ROBITUSSIN AC) 100-10 MG/5ML syrup, Take 5 mLs by mouth 3 (three) times daily as needed for Cough, Disp: 118 mL, Rfl: 0    insulin glargine (LANTUS SOLOSTAR) 100  UNIT/ML injection pen, Patient takes 50 units at night, Disp: 45 mL, Rfl: 3    niacin (NIASPAN) 500 MG CR tablet, TAKE 1 TABLET BY MOUTH TWICE DAILY, Disp: 180 tablet, Rfl: 3    PARoxetine (Paxil) 20 MG tablet, Take 1 tablet (20 mg total) by mouth every morning, Disp: 30 tablet, Rfl: 11    sacubitril-valsartan (Entresto) 97-103 MG Tab per tablet, Take 1 tablet by mouth 2 (two) times daily, Disp: 180 tablet, Rfl: 3    Semglee, yfgn, 100 UNIT/ML Solution Pen-injector, ADMINISTER 50 UNITS UNDER THE SKIN AT BEDTIME, Disp: , Rfl:     SITagliptin-metFORMIN (Janumet) 50-1000 MG tablet, Take 1 tablet by mouth 2 (two) times daily with meals, Disp: 180 tablet, Rfl: 3    spironolactone (ALDACTONE) 25 MG tablet, Take 0.5 tablets (12.5 mg total) by mouth daily, Disp: 45 tablet, Rfl: 3    tamsulosin (FLOMAX) 0.4 MG Cap, TAKE 1 CAPSULE(0.4 MG) BY MOUTH DAILY AFTER DINNER, Disp: 90 capsule, Rfl: 3    torsemide (Demadex) 20 MG tablet, Take 2 tablets (40 mg total) by mouth every morning, Disp: 90 tablet, Rfl: 0      FAMILY HISTORY: family history includes Breast cancer in his mother; Coronary artery disease in his mother; Diabetes in his brother and mother; Heart attack in his father; Heart attack (age of onset: 13) in his mother; Hypertension in his father and mother.      SOCIAL HISTORY: He reports that he has never smoked. He has never used smokeless tobacco. He reports that he does not drink alcohol and does not use drugs.    REVIEW OF SYSTEMS:   Gen: Denies weight changes, no fatigue  HEENT: denies upper airway procedures. No congestion. No swallowing difficults. No bruxism  Resp: No dyspnea or cough  Cards: No chest pains or palpitation  GI: no n/v/d  G/U: no changes  Neuro: no strength or sensation changes  Skin: no rashes or discoloration  Psych: mood stable      PHYSICAL EXAMINATION    Visit Vitals  BP 115/80 (BP Site: Left arm, Patient Position: Sitting, Cuff Size: Large)   Pulse 83   Ht 1.854 m (6\' 1" )   Wt 92.5 kg (204  lb)   BMI 26.91 kg/m        General Appearance:  A well-appearing male in no acute distress.  Does not appear sleepy.   Skin: Warm and dry to touch, no apparent skin lesions, or masses noted.  Head: Normocephalic, normal hair pattern, no masses or tenderness   Eyes: EOMS Intact, PERRL, conjunctivae and lids unremarkable.  Funduscopic exam and visual fields not performed.   ENT: Ears, Nose and throat reveal no gross abnormalities.  No pallor or cyanosis.  Dentition good.  No micrognathia. Large tongue/scalloping. Uvula midline.  Neck: JVP normal, no carotid bruit, thyroid not enlarged   Chest: Clear to auscultation bilaterally with good air movement and respiratory effort and no wheezes, rales, or rhonchi   Cardiovascular: Regular rhythm, S1 normal, S2 normal, No S3 or S4, Apical impulse not displaced, no murmurs, gallops or rubs detected   Abdomen: Soft, nontender, nondistended, with normoactive bowel sounds. No organomegaly.  No pulsatile masses, or bruits.   Extremities: Warm without edema,  clubbing, or cyanosis. All peripheral pulses are full and equal.   Neuro: Alert and oriented x3. No gross motor or sensory deficits noted, affect appropriate.          LABS:   Lab Results   Component Value Date    AST 16 05/05/2021    ALT 10 05/05/2021     No results found for: CBC, IRON, TIBC, FERRITIN  Lab Results   Component Value Date    TSH 0.74 08/12/2014         IMPRESSION:   [name] [name] is a 75 y.o. male with the following problems:    Remote diagnosis sleep apnea, left untreated  CAD  Significant heart failure, reduced ejection fraction 25 to 30%, elevated pulmonary arterial pressure, followed closely by Dr. Webb Silversmith      RECOMMENDATIONS:    Snoring/non restorative sleep - will evaluate for sleep apnea  -Concern OSA in this patient with clinical symptoms including but not necessarily limited to: snoring, daytime sleepiness, non-refreshing sleep, dry mouth on waking  -Risk factors include: BMI>30, remote dx OSA now  untreated, large neck circumference, older age, male gender, and Family history.   - we discussed pathophysiology of OSA in addition to briefly reviewing potential treatment options such as PAP, MADs, tongue stimulation and surgical interventions.  - will evaluate for sleep-disordered breathing in lab split-night assessment for adequate diagnostic as well as treatment options going forward.  -we discussed how body position during sleep, weight, alcohol and certain anxiety/muscle medications can affect sleep apnea.   - close follow-up with test results review office visit within 2 wks of diagnostic sleep study.  - PAP treatment to follow if indicated vs alternatives that were briefly discussed.  - patient knows not to drive or operate machinery if at all drowsy  - in the mean time, HOB elevation and non-supine sleeping may be helpful  -Continue close follow-up with cardiology, heart failure team and remain compliant with goal-directed care.                                               Orders Placed This Encounter   Procedures    SPLIT NIGHT STUDY (HRT [place])    Sleep APP Visit (HRT [place])         No orders of the defined types were placed in this encounter.        SIGNED:    Shary Key, MD         This note was generated by the Dragon speech recognition and may contain errors or omissions not intended by the user. Grammatical errors, random word insertions, deletions, pronoun errors, and incomplete sentences are occasional consequences of this technology due to software limitations. Not all errors are caught or corrected. If there are questions or concerns about the content of this note or information contained within the body of this dictation, they should be addressed directly with the author for clarification.

## 2021-06-11 ENCOUNTER — Ambulatory Visit (INDEPENDENT_AMBULATORY_CARE_PROVIDER_SITE_OTHER): Payer: Commercial Managed Care - POS | Admitting: Cardiovascular Disease

## 2021-06-11 ENCOUNTER — Encounter (INDEPENDENT_AMBULATORY_CARE_PROVIDER_SITE_OTHER): Payer: Self-pay | Admitting: Cardiovascular Disease

## 2021-06-11 DIAGNOSIS — I5022 Chronic systolic (congestive) heart failure: Secondary | ICD-10-CM

## 2021-06-11 DIAGNOSIS — I428 Other cardiomyopathies: Secondary | ICD-10-CM

## 2021-06-11 MED ORDER — METOLAZONE 2.5 MG PO TABS
2.5000 mg | ORAL_TABLET | ORAL | 3 refills | Status: DC
Start: 2021-06-11 — End: 2022-03-20

## 2021-06-11 NOTE — Progress Notes (Signed)
[facility] ADVANCED HEART FAILURE SUB-SPECIALTY FOLLOW-UP CONSULTATION NOTE    HRT [facility] OFFICE -CARDIOLOGY  [address]  [place] [address]  Dept: [phone]  Dept Fax: [phone]     Patient Name: [name] [name]    Date of Visit:  June 11, 2021  Date of Birth: 11-20-1945  AGE: 75 y.o.  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 Wendel Homeyer today as a follow up visit in the Advanced Heart Failure Clinic at [facility]. He is a pleasant 75 y.o. 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  Is noted that his ejection fraction was 15% on echocardiogram in May 2021.  He has grade 3 diastolic dysfunction and mild MR and TR.  He has a Medtronic BiV ICD.  His Coreg 25 mg twice daily, Entresto 97/103 mg twice daily, and Spironolactone 12.5 mg daily.  He is on torsemide 40 mg twice daily.     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     He reports increased shortness of breath during the last 3 to 4 days.  His weight has increased between visits.  He previously weighed 205 pounds now he weighs 224 pounds.  There is been no missed medications or significant change in diet.    PAST MEDICAL HISTORY: He has a past medical history of Acute CHF, Acute systolic (congestive) heart failure (08/2017), Anxiety, CAD (coronary artery disease), Cardiomyopathy, CHF (congestive heart failure) (08/12/2014, 2013), Chronic obstructive pulmonary disease, Coronary artery disease (2011), Depression, echocardiogram (02/2016, 11/2016, 09/2017, 12/20/2017), GERD (gastroesophageal reflux disease), Heart attack (2011), Hyperlipemia, Hypertension, ICD (implantable 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, Ischemic  cardiomyopathy, Nonischemic cardiomyopathy, NSTEMI (non-ST elevated myocardial infarction), 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 He has a past surgical history that includes duodenal ulcer (1973); orthopedic surgery (AGE 47); EGD (N/A, 08/15/2014); Circumcision (AGE 54); Fracture surgery (AGE 31); Hernia repair (AGE 54); Colonoscopy (2009); Tonsillectomy and adenoidectomy (1954); Cardiac defibrillator placement (12/2012); Cardiac pacemaker placement (2014); EGD (1975); EGD, COLONOSCOPY (N/A, 04/04/2018); ECHOCARDIOGRAM, TRANSTHORACIC (02/2016 11/2016 09/2017 12/2017); MPI nuclear study (06/2016); Correction hammer toe; BIV (11/10/2016); ECHOCARDIOGRAM, TRANSTHORACIC (02/2016,11/2016,12/20/2017); Cardiac catheterization (03/2010); and Cardiac catheterization (12/2012).    ALLERGIES: No Known Allergies    MEDICATIONS:   Current Outpatient Medications:     ALPRAZolam (XANAX) 0.25 MG tablet, Take 0.25 mg by mouth nightly as needed, Disp: , Rfl:     aspirin EC 81 MG EC tablet, Take 81 mg by mouth daily, Disp: , Rfl:     atorvastatin (LIPITOR) 40 MG tablet, Take 1 tablet (40 mg total) by mouth nightly, Disp: 90 tablet, Rfl: 0    carvedilol (COREG) 25 MG tablet, Take 1 tablet (25 mg total) by mouth 2 (two) times daily with meals, Disp: 180 tablet, Rfl: 3    docusate sodium (COLACE) 100 MG capsule, Take by mouth daily, Disp: , Rfl:     empagliflozin (JARDIANCE) 10 MG tablet, Take 1 tablet (10 mg total) by mouth daily, Disp: 90 tablet, Rfl: 3    fluticasone furoate-vilanterol (Breo Ellipta) 100-25 MCG/INH Aerosol Pwdr, Breath Activated, Inhale 1 puff into the lungs daily, Disp: 3  each, Rfl: 3    glucose blood (ONE TOUCH ULTRA TEST) test strip, 1 each by Other route 2 (two) times daily Use as instructed, Disp: 300 each, Rfl: 1    guaiFENesin-codeine (ROBITUSSIN AC) 100-10 MG/5ML syrup,  Take 5 mLs by mouth 3 (three) times daily as needed for Cough, Disp: 118 mL, Rfl: 0    insulin glargine (LANTUS SOLOSTAR) 100 UNIT/ML injection pen, Patient takes 50 units at night, Disp: 45 mL, Rfl: 3    niacin (NIASPAN) 500 MG CR tablet, TAKE 1 TABLET BY MOUTH TWICE DAILY, Disp: 180 tablet, Rfl: 3    PARoxetine (Paxil) 20 MG tablet, Take 1 tablet (20 mg total) by mouth every morning, Disp: 30 tablet, Rfl: 11    sacubitril-valsartan (Entresto) 97-103 MG Tab per tablet, Take 1 tablet by mouth 2 (two) times daily, Disp: 180 tablet, Rfl: 3    Semglee, yfgn, 100 UNIT/ML Solution Pen-injector, ADMINISTER 50 UNITS UNDER THE SKIN AT BEDTIME, Disp: , Rfl:     SITagliptin-metFORMIN (Janumet) 50-1000 MG tablet, Take 1 tablet by mouth 2 (two) times daily with meals, Disp: 180 tablet, Rfl: 3    spironolactone (ALDACTONE) 25 MG tablet, Take 0.5 tablets (12.5 mg total) by mouth daily, Disp: 45 tablet, Rfl: 3    tamsulosin (FLOMAX) 0.4 MG Cap, TAKE 1 CAPSULE(0.4 MG) BY MOUTH DAILY AFTER DINNER, Disp: 90 capsule, Rfl: 3    torsemide (Demadex) 20 MG tablet, Take 2 tablets (40 mg total) by mouth every morning, Disp: 90 tablet, Rfl: 0   Patient's current medications were reviewed. ONLY Cardiac medications were updated unless others were addressed in assessment and plan.    FAMILY HISTORY: family history includes Breast cancer in his mother; Coronary artery disease in his mother; Diabetes in his brother and mother; Heart attack in his father; Heart attack (age of onset: 34) in his mother; Hypertension in his father and mother.    SOCIAL HISTORY: He reports that he has never smoked. He has never used smokeless tobacco. He reports that he does not drink alcohol and does not use drugs.    PHYSICAL EXAMINATION  Visit Vitals  Blood Pressure 138/88 (BP Site: Left arm, Patient Position: Sitting, Cuff Size: Medium)   Pulse 85   Height 1.829 m (6')   Weight 94.2 kg (207 lb 9.6 oz)   Oxygen Saturation 98%   Body Mass Index 28.16 kg/m     Last  3 Weights:   Weight Monitoring 04/24/2021 06/04/2021 06/11/2021   Height 182.9 cm 185.4 cm 182.9 cm   Height Method - - -   Weight 93.169 kg 92.534 kg 94.167 kg   Weight Method - - -   BMI (calculated) 27.9 kg/m2 27 kg/m2 28.2 kg/m2       General Appearance:  A well-appearing male in no acute distress.    Skin: Warm and dry to touch, no apparent skin lesions  Head: Normocephalic, normal hair pattern  Eyes: EOMS Intact, conjunctivae and lids unremarkable.    ENT: Ears, Nose and throat reveal no gross abnormalities.  No pallor or cyanosis.   Neck: JVP is normal  Chest: Clear to auscultation bilaterally with good air movement and respiratory effort and mild wheezes, rales, or rhonchi   Cardiovascular: Regular rhythm, S1 normal, S2 normal, No S3 or S4. Apical impulse not displaced. No murmur. No gallops or rubs detected   Abdomen: Soft, nontender, nondistended, with normoactive bowel sounds. No organomegaly.    Extremities: Warm without edema. No clubbing, or cyanosis.  Radial pulses are full and equal.   Neuro: Alert and oriented x3. No gross motor or sensory deficits noted, affect appropriate.      LABS:   Lab Results   Component Value Date    WBC 38.09 (HH) 05/05/2021    HGB 14.0 05/05/2021    HCT 44.6 05/05/2021    PLT 111 (L) 05/05/2021     Lab Results   Component Value Date    GLU 70 05/05/2021    BUN 20.0 05/05/2021    CREAT 1.5 05/05/2021    NA 136 05/05/2021    K 4.9 05/05/2021    CL 106 05/05/2021    CO2 21 05/05/2021    CA 9.2 05/05/2021    PROT 6.8 05/05/2021    AST 16 05/05/2021    ALT 10 05/05/2021    BILITOTAL 0.9 05/05/2021    GLOB 2.8 05/05/2021    ALB 4.0 05/05/2021     Lab Results   Component Value Date    TSH 0.74 08/12/2014    CK 152 02/17/2021    BNP 231.4 (H) 02/19/2021    BNP 1,354.5 (H) 02/17/2021    BNP 956.2 (H) 09/18/2017    HGBA1C 5.9 05/05/2021     Lab Results   Component Value Date    CHOL 149 05/05/2021    CHOL 185 01/13/2021    TRIG 71 05/05/2021    TRIG 96 01/13/2021    HDL 38 (L)  05/05/2021    HDL 36 (L) 01/13/2021    LDL 97 05/05/2021    LDL 130 (H) 01/13/2021    LABVLDL 14 05/05/2021    LABVLDL 19 01/13/2021    CHOLHDLRATIO 3.9 05/05/2021    CHOLHDLRATIO 5.1 01/13/2021     TTE, August 2022:  1. Mildly dilated left ventricle with severely reduced systolic function, LVEF 25-30% visually.  2. Calculated ejection fraction, using modified Simpsons (Method of Discs) is 31%.  3. Grade III diastolic dysfunction.  4. The left atrium is moderately dilated by LAVI criteria (42-48 mL/M2)  5. Moderate mitral valve regurgitation.  6. Trace aortic valve regurgitation.  7. Moderate tricuspid valve regurgitation.  8. 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  9. Compared to the previous study in July 2021, the LVEF is comparable, but the degree of MR and TR  have increased, and the PA pressures are now moderately elevated.    IMPRESSION / RECOMMENDATIONS:   Mr. Brassfield a 75 y.o.male with chronic systolic heart failure due to nonischemic cardiomyopathy, ACC/AHA stage C, NYHA class 3. Today he is feeling well and is euvolemic on exam.  The last LVEF was 15% demonstrated by echocardiogram in May 2021 and 20-25% in July 2021.     2   Goal directed therapy includes:  Beta blocker: Carvedilol 25 mg twice daily  ACE/ARB/ARNI: Entresto 97/103 mg twice daily  MRA: Spironolactone 12.5 mg daily  SGLT2i:  jardiance 10mg  daily     3   Current diuretic regimen: torsemide 40 mg in the morning and 40 mg in the afternoon   Adding metolazone 2.5 mg once weekly     Sleep study ordered     Next follow up will be with Conception Oms, NP in 3 months.  No orders of the defined types were placed in this encounter.      No orders of the defined types were placed in this encounter.      SIGNED:    Marianna Fuss, MD    This note was generated by the Dragon speech recognition and may contain errors or omissions not intended by the user. Grammatical errors, random  word insertions, deletions, pronoun errors, and incomplete sentences are occasional consequences of this technology due to software limitations. Not all errors are caught or corrected. If there are questions or concerns about the content of this note or information contained within the body of this dictation, they should be addressed directly with the author for clarification.

## 2021-06-17 ENCOUNTER — Telehealth (INDEPENDENT_AMBULATORY_CARE_PROVIDER_SITE_OTHER): Payer: Self-pay | Admitting: Family Medicine

## 2021-06-17 NOTE — Telephone Encounter (Signed)
Pt sch to complete a sleep study and needs to have the medication.    Name, strength, directions of requested refill(s):    fluticasone furoate-vilanterol (Breo Ellipta) 100-25 MCG/INH Aerosol Pwdr, Breath Activated     Pharmacy to send refill to or patient to pick up rx from office (mark requested pharmacy in [place]):      [facility] #[id number] [place], [place] - [address] AT [facility] OF [address]  [address]  [place] [address]  Phone: [phone] Fax: [phone]    [facility] #[id number] - [place], [place] - [address] AT [facility] [address]  [address]  [place] [address]  Phone: [phone] Fax: [phone]    [facility]ORE #[id number] [place], [place] - [address] AT [facility] [address] [facility]  [address]  [place] [address]  Phone: [phone] Fax: [phone]        Please mark "X" next to the preferred call back number:    Mobile:   Telephone Information:   Mobile [phone]       Home: @HOMEPHONE @    Work: @WORKPHONE @        Medication refill request , see above. Thank you     Additional Notes:     Next Visit:

## 2021-06-18 MED ORDER — FLUTICASONE FUROATE-VILANTEROL 100-25 MCG/INH IN AEPB
1.0000 | INHALATION_SPRAY | Freq: Every day | RESPIRATORY_TRACT | 3 refills | Status: DC
Start: 2021-06-18 — End: 2022-02-25

## 2021-06-20 ENCOUNTER — Encounter (INDEPENDENT_AMBULATORY_CARE_PROVIDER_SITE_OTHER): Payer: Commercial Managed Care - POS

## 2021-07-04 ENCOUNTER — Encounter (INDEPENDENT_AMBULATORY_CARE_PROVIDER_SITE_OTHER): Payer: Self-pay | Admitting: Critical Care Medicine

## 2021-07-04 ENCOUNTER — Ambulatory Visit (INDEPENDENT_AMBULATORY_CARE_PROVIDER_SITE_OTHER): Payer: Commercial Managed Care - POS

## 2021-07-04 ENCOUNTER — Encounter (INDEPENDENT_AMBULATORY_CARE_PROVIDER_SITE_OTHER): Payer: Self-pay | Admitting: Sleep Medicine

## 2021-07-04 DIAGNOSIS — G4733 Obstructive sleep apnea (adult) (pediatric): Secondary | ICD-10-CM

## 2021-07-05 NOTE — Progress Notes (Signed)
SLEEP CENTER    HRT [place]  [facility] OFFICE -CARDIOLOGY  [address]  [place] [address]  Dept: [phone]  Dept Fax: [phone]       Patient Name: [name] [name]  DOB:   Oct 30, 1945   Age:   75 y.o.   MR:   [id number]     Insurance (s):[employer]  Lab location: [place]  Room number: 2  Completed study type: Split with CPAP/BIPAP  Split reason (if applicable): AHI >20/hr  Mask (s) used : [employer] Vitera (Large)  Supine REM achieved: No  (lateral only)  Sleep aid : No  O2 nadir: 82 %  Companion attended : No    Reason to Expedite: N/A  Lost and found: N/A    Shift summary:   Patient met the split criteria (AHI >20/hr).  He exhibited hypopneas, mixed, and central apneas on both supine and non-supine worst during REM. Snoring was moderately harsh, intermittent.   CPAP was initially started however, due to the persistence of central apneas, he was switched to BIPAP.   BIPAP was well-tolerated using the [employer] manager (Large).   Optimal pressure was not achieved based on emergence of central apneas.  Average SpO2 was 96.1% and the O2 nadir was 82%.  Abnormal EKG was observed.  PLMs and bruxism were present.    Auto BIPAP: Min EPAP: 4.0cmH20     Max P: 25.0cmH20  PS: 4.0cmH20

## 2021-07-08 ENCOUNTER — Encounter (INDEPENDENT_AMBULATORY_CARE_PROVIDER_SITE_OTHER): Payer: Self-pay | Admitting: Family Medicine

## 2021-07-14 DIAGNOSIS — Z95810 Presence of automatic (implantable) cardiac defibrillator: Secondary | ICD-10-CM

## 2021-07-16 ENCOUNTER — Other Ambulatory Visit (INDEPENDENT_AMBULATORY_CARE_PROVIDER_SITE_OTHER): Payer: Self-pay

## 2021-07-16 DIAGNOSIS — E782 Mixed hyperlipidemia: Secondary | ICD-10-CM

## 2021-07-16 MED ORDER — ATORVASTATIN CALCIUM 40 MG PO TABS
40.0000 mg | ORAL_TABLET | Freq: Every evening | ORAL | 0 refills | Status: DC
Start: 2021-07-16 — End: 2023-02-26

## 2021-08-01 ENCOUNTER — Ambulatory Visit (INDEPENDENT_AMBULATORY_CARE_PROVIDER_SITE_OTHER): Payer: Commercial Managed Care - POS | Admitting: Sleep Medicine

## 2021-08-01 NOTE — Progress Notes (Deleted)
[facility] SLEEP OFFICE PROGRESS NOTE    HRT [place]  [place] [facility] [place] OFF[facility] -CARDIOLOGY  [address]  [place] [address]  Dept: [phone]  Dept Fax: [phone]       Patient Name: [name] [name]    Date of Visit:  August 01, 2021  Date of Birth: July 17, 1946  AGE: 75 y.o.  Medical Record #: [id number]      CHIEF COMPLAINT:  No chief complaint on file.       HISTORY OF PRESENT ILLNESS:    75 year old man, known to me, returns for test result review.  Last seen by me in clinic on 06/04/2021.    To briefly review he was sent to [place] from Dr. Webb Silversmith with history of combined systolic and diastolic heart failure with reduced EF 25 to 30% by most recent echo, elevated pulmonary arterial pressures, consistent with goal-directed therapy.  Ultimately suggested he undergo baseline sleep testing.    [name] [name] completed a split-night study on 07/04/2021.  This reveals overall AHI 58.1, RDI 58.1, central apneic index 39.2, consistent with severe central sleep apnea and OSA.  Ultimately started on CPAP and then BiPAP therapy however there was persistent central apneic events and Cheyne-Stokes respirations noted on both modalities.    PAST MEDICAL HISTORY: He has a past medical history of Acute CHF, Acute systolic (congestive) heart failure (08/2017), Anxiety, CAD (coronary artery disease), Cardiomyopathy, CHF (congestive heart failure) (08/12/2014, 2013), Chronic obstructive pulmonary disease, Coronary artery disease (2011), Depression, echocardiogram (02/2016, 11/2016, 09/2017, 12/20/2017), GERD (gastroesophageal reflux disease), Heart attack (2011), Hyperlipemia, Hypertension, ICD (implantable 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, Ischemic cardiomyopathy, Nonischemic cardiomyopathy, NSTEMI (non-ST elevated myocardial infarction), Nuclear MPI (06/2016), Pacemaker (2014), Pneumonia (09/2016), Primary cardiomyopathy, Sepsis (08/2017), Syncope and collapse, Type 2 diabetes mellitus,  controlled (DX 1998), and Wheeze. He has a past surgical history that includes duodenal ulcer (1973); orthopedic surgery (AGE 35); EGD (N/A, 08/15/2014); Circumcision (AGE 51); Fracture surgery (AGE 44); Hernia repair (AGE 51); Colonoscopy (2009); Tonsillectomy and adenoidectomy (1954); Cardiac defibrillator placement (12/2012); Cardiac pacemaker placement (2014); EGD (1975); EGD, COLONOSCOPY (N/A, 04/04/2018); ECHOCARDIOGRAM, TRANSTHORACIC (02/2016 11/2016 09/2017 12/2017); MPI nuclear study (06/2016); Correction hammer toe; BIV (11/10/2016); ECHOCARDIOGRAM, TRANSTHORACIC (02/2016,11/2016,12/20/2017); Cardiac catheterization (03/2010); and Cardiac catheterization (12/2012).    ALLERGIES: No Known Allergies    MEDICATIONS:     Current Outpatient Medications:     ALPRAZolam (XANAX) 0.25 MG tablet, Take 0.25 mg by mouth nightly as needed, Disp: , Rfl:     aspirin EC 81 MG EC tablet, Take 81 mg by mouth daily, Disp: , Rfl:     atorvastatin (LIPITOR) 40 MG tablet, Take 1 tablet (40 mg total) by mouth nightly, Disp: 90 tablet, Rfl: 0    carvedilol (COREG) 25 MG tablet, Take 1 tablet (25 mg total) by mouth 2 (two) times daily with meals, Disp: 180 tablet, Rfl: 3    docusate sodium (COLACE) 100 MG capsule, Take by mouth daily, Disp: , Rfl:     empagliflozin (JARDIANCE) 10 MG tablet, Take 1 tablet (10 mg total) by mouth daily, Disp: 90 tablet, Rfl: 3    fluticasone furoate-vilanterol (Breo Ellipta) 100-25 MCG/INH Aerosol Pwdr, Breath Activated, Inhale 1 puff into the lungs daily, Disp: 3 each, Rfl: 3    glucose blood (ONE TOUCH ULTRA TEST) test strip, 1 each by Other route 2 (two) times daily Use as instructed, Disp: 300 each, Rfl: 1    guaiFENesin-codeine (ROBITUSSIN AC) 100-10 MG/5ML syrup, Take 5 mLs by mouth 3 (three) times daily  as needed for Cough, Disp: 118 mL, Rfl: 0    insulin glargine (LANTUS SOLOSTAR) 100 UNIT/ML injection pen, Patient takes 50 units at night, Disp: 45 mL, Rfl: 3    metOLazone (ZAROXOLYN) 2.5 MG  tablet, Take 1 tablet (2.5 mg total) by mouth once a week, Disp: 12 tablet, Rfl: 3    niacin (NIASPAN) 500 MG CR tablet, TAKE 1 TABLET BY MOUTH TWICE DAILY, Disp: 180 tablet, Rfl: 3    PARoxetine (Paxil) 20 MG tablet, Take 1 tablet (20 mg total) by mouth every morning, Disp: 30 tablet, Rfl: 11    sacubitril-valsartan (Entresto) 97-103 MG Tab per tablet, Take 1 tablet by mouth 2 (two) times daily, Disp: 180 tablet, Rfl: 3    Semglee, yfgn, 100 UNIT/ML Solution Pen-injector, ADMINISTER 50 UNITS UNDER THE SKIN AT BEDTIME, Disp: , Rfl:     SITagliptin-metFORMIN (Janumet) 50-1000 MG tablet, Take 1 tablet by mouth 2 (two) times daily with meals, Disp: 180 tablet, Rfl: 3    spironolactone (ALDACTONE) 25 MG tablet, Take 0.5 tablets (12.5 mg total) by mouth daily, Disp: 45 tablet, Rfl: 3    tamsulosin (FLOMAX) 0.4 MG Cap, TAKE 1 CAPSULE(0.4 MG) BY MOUTH DAILY AFTER DINNER, Disp: 90 capsule, Rfl: 3    torsemide (Demadex) 20 MG tablet, Take 2 tablets (40 mg total) by mouth every morning, Disp: 90 tablet, Rfl: 0    FAMILY HISTORY: family history includes Breast cancer in his mother; Coronary artery disease in his mother; Diabetes in his brother and mother; Heart attack in his father; Heart attack (age of onset: 48) in his mother; Hypertension in his father and mother.    SOCIAL HISTORY: He reports that he has never smoked. He has never used smokeless tobacco. He reports that he does not drink alcohol and does not use drugs.      PHYSICAL EXAM:    There were no vitals taken for this visit.     General Appearance:  A well-appearing male in no acute distress.    Skin: Warm and dry to touch, no apparent skin lesions, or masses noted.  Head: Normocephalic, normal hair pattern, no masses or tenderness   Eyes: EOMS Intact, PERRL, conjunctivae and lids unremarkable.  Funduscopic exam and visual fields not performed.   ENT: Ears, Nose and throat reveal no gross abnormalities.  No pallor or cyanosis.  Dentition good.   Neck: JVP normal,  no carotid bruit, thyroid not enlarged   Chest: Clear to auscultation bilaterally with good air movement and respiratory effort and no wheezes, rales, or rhonchi   Cardiovascular: Regular rhythm, S1 normal, S2 normal, No S3 or S4, Apical impulse not displaced, no murmurs, gallops or rubs detected   Abdomen: Soft, nontender, nondistended, with normoactive bowel sounds. No organomegaly.  No pulsatile masses, or bruits.   Extremities: Warm without edema, clubbing, or cyanosis. All peripheral pulses are full and equal.   Neuro: Alert and oriented x3. No gross motor or sensory deficits noted, affect appropriate.        LABS:   Lab Results   Component Value Date    AST 16 05/05/2021    ALT 10 05/05/2021     No results found for: CBC, IRON, TIBC, FERRITIN  Lab Results   Component Value Date    TSH 0.74 08/12/2014         IMPRESSION:   [name] [name] is a 76 y.o. male with the following problems:    New diagnosis severe central and obstructive sleep apnea  by split-night testing on 07/04/2021, AHI 58.1, central apneic index 39, intolerant to CPAP and BiPAP therapy due to persistent CSA and Cheyne-Stokes respiration  CAD  Significant heart failure, reduced ejection fraction 25 to 30%, elevated pulmonary arterial pressure, followed closely by Dr. Webb Silversmith      RECOMMENDATIONS:    All sleep studies reviewed together with patient.  He does express understanding.  Fortunately has a very significant degree of sleep disordered breathing which includes central and obstructive sleep apnea.  With his low EF of 25 to 30% he is not a candidate for something like ASV therapy.  His only option at this point is largely BiPAP ST versus remedy device.  At this point I do recommend strongly he present for full night in lab BiPAP ST titration study moving forward.  We did review again the pathophysiology of OSA and central sleep apnea and certainly how may contribute to ongoing cardiovascular care disease, increased pulmonary pressures and  persistence of reduced EF.  We do feel the treatment will optimize his therapy going forward and contribute to his goal-directed care.    [name] [name] agrees for further titration study.  We will arrange for full night in lab BiPAP ST titration with hypoxemia protocol going forward.  He will return in 1 to 2 weeks following testing in order to review the results and initiate treatment as indicated.    In the meantime he will continue to practice good sleep hygiene.  He will avoid all alcohol or sedatives.  He will not drive if at all drowsy.  He will continue close follow-up with cardiology and primary care doctors as scheduled.                                                 No orders of the defined types were placed in this encounter.      No orders of the defined types were placed in this encounter.        SIGNED:    Shary Key, MD        This note was generated by the Dragon speech recognition and may contain errors or omissions not intended by the user. Grammatical errors, random word insertions, deletions, pronoun errors, and incomplete sentences are occasional consequences of this technology due to software limitations. Not all errors are caught or corrected. If there are questions or concerns about the content of this note or information contained within the body of this dictation, they should be addressed directly with the author for clarification.

## 2021-08-04 ENCOUNTER — Other Ambulatory Visit (INDEPENDENT_AMBULATORY_CARE_PROVIDER_SITE_OTHER): Payer: Commercial Managed Care - POS | Admitting: Cardiovascular Disease

## 2021-08-04 LAB — REMOTE CARDIAC DEVICE MONITORING
AF Burden Percentage: 0
ICD Shock Recent Count: 0
RV Pacing Percentage: 2.58

## 2021-09-04 ENCOUNTER — Encounter (INDEPENDENT_AMBULATORY_CARE_PROVIDER_SITE_OTHER): Payer: Self-pay | Admitting: Nurse Practitioner

## 2021-09-04 ENCOUNTER — Ambulatory Visit (INDEPENDENT_AMBULATORY_CARE_PROVIDER_SITE_OTHER): Payer: Commercial Managed Care - POS | Admitting: Nurse Practitioner

## 2021-09-04 VITALS — BP 100/76 | HR 84 | Ht 72.0 in | Wt 213.4 lb

## 2021-09-04 DIAGNOSIS — I5022 Chronic systolic (congestive) heart failure: Secondary | ICD-10-CM

## 2021-09-04 DIAGNOSIS — I428 Other cardiomyopathies: Secondary | ICD-10-CM

## 2021-09-04 MED ORDER — ENTRESTO 97-103 MG PO TABS
1.0000 | ORAL_TABLET | Freq: Two times a day (BID) | ORAL | 3 refills | Status: DC
Start: 2021-09-04 — End: 2022-03-20

## 2021-09-04 NOTE — Progress Notes (Signed)
[facility] ADVANCED HEART FAILURE OFFICE PROGRESS NOTE    HRT [facility] OFFICE -CARDIOLOGY  [address]  [place] [address]  Dept: [phone]  Dept Fax: [phone]       Patient Name: [name] [name]    Date of Visit:  September 04, 2021  Date of Birth: 01/27/1946  AGE: 75 y.o.  Medical Record #: [id number]      CHIEF COMPLAINT:    Chief Complaint   Patient presents with    Cardiomyopathy    Follow-up       HISTORY OF PRESENT ILLNESS:    I had the pleasure of seeing Mr. Maveryck Bahri today in follow up in the Advanced Heart Failure Clinic at [facility]. He is a pleasant 75 y.o. 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 Last seen in office back in August. He was started on Metolazone 2.5mg  once a week. Since then, he did notice some weight loss however he did gain some weight in the past couple of weeks. For HF GDMT, he is on carvedilol 25 mg twice daily, Entresto 97/23 mg twice daily, Jardiance 10 mg daily, and spironolactone 12.5 mg daily.      He completed echocardiogram in August 2022 showing EF 25 to 30%, grade 3 diastolic dysfunction, moderate mitral valve regurgitation, moderate tricuspid valve regurgitation, moderate pulmonary hypertension, and trace aortic valve regurgitation.  Denies any significant shortness of breath, dizziness, orthopnea, PND or edema. Weight stable around 210-214lbs on home scale. He is on torsemide 40mg  daily along with metolazone 2.5mg  once a week.       PAST MEDICAL HISTORY: He has a past medical history of Acute CHF, Acute systolic (congestive) heart failure (08/2017), Anxiety, CAD (coronary artery disease), Cardiomyopathy, CHF (congestive heart failure) (08/12/2014, 2013), Chronic obstructive pulmonary disease, Coronary artery disease (2011), Depression, echocardiogram (02/2016, 11/2016, 09/2017, 12/20/2017), GERD (gastroesophageal  reflux disease), Heart attack (2011), Hyperlipemia, Hypertension, ICD (implantable 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, Ischemic cardiomyopathy, Nonischemic cardiomyopathy, NSTEMI (non-ST elevated myocardial infarction), 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 He has a past surgical history that includes duodenal ulcer (1973); orthopedic surgery (AGE 50); EGD (N/A, 08/15/2014); Circumcision (AGE 64); Fracture surgery (AGE 73); Hernia repair (AGE 64); Colonoscopy (2009); Tonsillectomy and adenoidectomy (1954); Cardiac defibrillator placement (12/2012); Cardiac pacemaker placement (2014); EGD (1975); EGD, COLONOSCOPY (N/A, 04/04/2018); ECHOCARDIOGRAM, TRANSTHORACIC (02/2016 11/2016 09/2017 12/2017); MPI nuclear study (06/2016); Correction hammer toe; BIV (11/10/2016); ECHOCARDIOGRAM, TRANSTHORACIC (02/2016,11/2016,12/20/2017); Cardiac catheterization (03/2010); and Cardiac catheterization (12/2012).    ALLERGIES: No Known Allergies    MEDICATIONS:   Current Outpatient Medications:     aspirin EC 81 MG EC tablet, Take 81 mg by mouth daily, Disp: , Rfl:     atorvastatin (LIPITOR) 40 MG tablet, Take 1 tablet (40 mg total) by mouth nightly, Disp: 90 tablet, Rfl: 0    carvedilol (COREG) 25 MG tablet, Take 1 tablet (25 mg total) by mouth 2 (two) times daily with meals, Disp: 180 tablet, Rfl: 3    empagliflozin (JARDIANCE) 10 MG tablet, Take 1 tablet (10 mg total) by mouth daily, Disp: 90 tablet, Rfl: 3    fluticasone furoate-vilanterol (Breo Ellipta) 100-25 MCG/INH Aerosol Pwdr, Breath Activated, Inhale 1 puff into the lungs daily, Disp: 3 each, Rfl: 3    glucose blood (ONE TOUCH ULTRA TEST) test strip,  1 each by Other route 2 (two) times daily Use as instructed, Disp: 300 each, Rfl: 1    insulin glargine (LANTUS SOLOSTAR) 100 UNIT/ML injection pen, Patient takes 50 units at night,  Disp: 45 mL, Rfl: 3    metOLazone (ZAROXOLYN) 2.5 MG tablet, Take 1 tablet (2.5 mg total) by mouth once a week, Disp: 12 tablet, Rfl: 3    niacin (NIASPAN) 500 MG CR tablet, TAKE 1 TABLET BY MOUTH TWICE DAILY, Disp: 180 tablet, Rfl: 3    PARoxetine (Paxil) 20 MG tablet, Take 1 tablet (20 mg total) by mouth every morning, Disp: 30 tablet, Rfl: 11    Semglee, yfgn, 100 UNIT/ML Solution Pen-injector, ADMINISTER 50 UNITS UNDER THE SKIN AT BEDTIME, Disp: , Rfl:     SITagliptin-metFORMIN (Janumet) 50-1000 MG tablet, Take 1 tablet by mouth 2 (two) times daily with meals, Disp: 180 tablet, Rfl: 3    spironolactone (ALDACTONE) 25 MG tablet, Take 0.5 tablets (12.5 mg total) by mouth daily, Disp: 45 tablet, Rfl: 3    tamsulosin (FLOMAX) 0.4 MG Cap, TAKE 1 CAPSULE(0.4 MG) BY MOUTH DAILY AFTER DINNER, Disp: 90 capsule, Rfl: 3    torsemide (Demadex) 20 MG tablet, Take 2 tablets (40 mg total) by mouth every morning, Disp: 90 tablet, Rfl: 0    docusate sodium (COLACE) 100 MG capsule, Take by mouth daily (Patient not taking: Reported on 09/04/2021), Disp: , Rfl:     guaiFENesin-codeine (ROBITUSSIN AC) 100-10 MG/5ML syrup, Take 5 mLs by mouth 3 (three) times daily as needed for Cough (Patient not taking: Reported on 09/04/2021), Disp: 118 mL, Rfl: 0    sacubitril-valsartan (Entresto) 97-103 MG Tab per tablet, Take 1 tablet by mouth 2 (two) times daily, Disp: 180 tablet, Rfl: 3     FAMILY HISTORY: family history includes Breast cancer in his mother; Coronary artery disease in his mother; Diabetes in his brother and mother; Heart attack in his father; Heart attack (age of onset: 57) in his mother; Hypertension in his father and mother.    SOCIAL HISTORY: He reports that he has never smoked. He has never used smokeless tobacco. He reports that he does not drink alcohol and does not use drugs.      PHYSICAL EXAMINATION    Visit Vitals  BP 100/76 (BP Site: Left arm, Patient Position: Sitting, Cuff Size: Medium)   Pulse 84   Ht 1.829 m  (6')   Wt 96.8 kg (213 lb 6.4 oz)   BMI 28.94 kg/m       Last 3 Weights:   Weight Monitoring 06/04/2021 06/11/2021 09/04/2021   Height 185.4 cm 182.9 cm 182.9 cm   Height Method - - -   Weight 92.534 kg 94.167 kg 96.798 kg   Weight Method - - -   BMI (calculated) 27 kg/m2 28.2 kg/m2 29 kg/m2         General Appearance:  Cooperative and in no acute distress.    Skin: Warm and dry to touch  Head: Normocephalic  Eyes: Conjunctivae and lids unremarkable.    ENT: wearing mask   Neck: mild JVD  Chest: Clear to auscultation bilaterally with good respiratory effort   Cardiovascular: Regular rhythm, S1 normal, S2 normal, No S3 or S4  Abdomen: Soft, nontender with normoactive bowel sounds.   Extremities: Warm/dry without edema, +2 b/l radial pulses  Neuro: Alert and oriented x3. No gross motor or sensory deficits noted, affect appropriate.          IMPRESSION / RECOMMENDATIONS:  Mr. Voong is a 75 y.o. male with chronic systolic heart failure due to nonischemic cardiomyopathy, ACC/AHA stage C, NYHA class 3. The last LVEF was 25-30% by echo August 2022. Nonocclusive CAD by prior cath in 2014  Status post Medtronic biventricular ICD  Goal directed therapy includes:  Beta blocker: Carvedilol 25 mg twice daily  ACE/ARB/ARNI: Entresto 97/103 mg twice daily  MRA: Spironolactone 12.5 mg daily  SGLT2i:  Jardiance 10mg  daily     Current diuretic regimen: Increase Torsemide to 40 mg in AM and additional 20mg  in afternoon. He is also on Metolazone 2.5mg  once a week.   Repeat BMP to reassess renal function and electrolytes   Monitor BP/HR and weights daily   Consider increase Spironolactone at next visit   Follow up with our AHF clinic in 6-8 weeks, or sooner if needed                                                   Orders Placed This Encounter   Procedures    Basic Metabolic Panel    ADVANCED HEART FAILURE OFFICE VISIT (HRT [place])       Orders Placed This Encounter   Medications    sacubitril-valsartan (Entresto) 97-103 MG Tab per  tablet     Sig: Take 1 tablet by mouth 2 (two) times daily     Dispense:  180 tablet     Refill:  3         SIGNED:    Conception Oms, NP          This note was generated by the Dragon speech recognition and may contain errors or omissions not intended by the user. Grammatical errors, random word insertions, deletions, pronoun errors, and incomplete sentences are occasional consequences of this technology due to software limitations. Not all errors are caught or corrected. If there are questions or concerns about the content of this note or information contained within the body of this dictation, they should be addressed directly with the author for clarification.

## 2021-09-17 ENCOUNTER — Other Ambulatory Visit (FREE_STANDING_LABORATORY_FACILITY): Payer: Commercial Managed Care - POS

## 2021-09-17 DIAGNOSIS — I428 Other cardiomyopathies: Secondary | ICD-10-CM

## 2021-09-17 LAB — BASIC METABOLIC PANEL
Anion Gap: 10 (ref 5.0–15.0)
BUN: 19 mg/dL (ref 9.0–28.0)
CO2: 28 mEq/L (ref 17–29)
Calcium: 9.7 mg/dL (ref 7.9–10.2)
Chloride: 107 mEq/L (ref 99–111)
Creatinine: 1.7 mg/dL — ABNORMAL HIGH (ref 0.5–1.5)
Glucose: 57 mg/dL — ABNORMAL LOW (ref 70–100)
Potassium: 4.7 mEq/L (ref 3.5–5.3)
Sodium: 145 mEq/L (ref 135–145)

## 2021-09-17 LAB — HEMOLYSIS INDEX: Hemolysis Index: 13 Index (ref 0–24)

## 2021-09-17 LAB — GFR: EGFR: 47.8

## 2021-10-07 ENCOUNTER — Encounter (INDEPENDENT_AMBULATORY_CARE_PROVIDER_SITE_OTHER): Payer: Commercial Managed Care - POS | Admitting: Family

## 2021-10-07 DIAGNOSIS — R063 Periodic breathing: Secondary | ICD-10-CM | POA: Insufficient documentation

## 2021-10-07 DIAGNOSIS — G4733 Obstructive sleep apnea (adult) (pediatric): Secondary | ICD-10-CM | POA: Insufficient documentation

## 2021-10-07 DIAGNOSIS — G4731 Primary central sleep apnea: Secondary | ICD-10-CM | POA: Insufficient documentation

## 2021-10-07 NOTE — Progress Notes (Deleted)
SLEEP MEDICINE OFFICE PROGRESS NOTE    HRT [facility] OFFICE      [facility] OFFICE -CARDIOLOGY  [address]  [place] [address]  Dept: [phone]  Dept Fax: [phone]       Patient Name: [name] [name]    Date of Visit:  October 07, 2021  Date of Birth: 1946-05-23  AGE: 75 y.o.  Medical Record #: [id number]  Requesting Physician: Zorita Pang, MD    CHIEF COMPLAINT:  No chief complaint on file.       HISTORY OF PRESENT ILLNESS:    [name] [name] is a pleasant 75 y.o. male who presents for sleep medicine follow up. He reported symptomatic snoring, nocturnal dyspnea, restless sleeper, difficulty staying asleep, sleep hallucination, daytime drowsiness with ESS of 11. 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  Bruxism detected.  No significant periodic limb movements.  Recommendation to return for a full night of BiPAP S/T titration given that he is not eligible for ASV for low LVEF < 45%.      PAST MEDICAL HISTORY: He has a past medical history of Acute CHF, Acute systolic (congestive) heart failure (08/2017), Anxiety, CAD (coronary artery disease), Cardiomyopathy, CHF (congestive heart failure) (08/12/2014, 2013), Chronic obstructive pulmonary disease, Coronary artery disease (2011), Depression, echocardiogram (02/2016, 11/2016, 09/2017, 12/20/2017), GERD (gastroesophageal reflux disease), Heart attack (2011), Hyperlipemia, Hypertension, ICD (implantable 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, Ischemic cardiomyopathy, Nonischemic cardiomyopathy, NSTEMI (non-ST elevated myocardial infarction), Nuclear MPI (06/2016), Pacemaker (2014), Pneumonia (09/2016), Primary cardiomyopathy, Sepsis (08/2017), Syncope and collapse, Type 2 diabetes mellitus, controlled (DX 1998),  and Wheeze. He has a past surgical history that includes duodenal ulcer (1973); orthopedic surgery (AGE 55); EGD (N/A, 08/15/2014); Circumcision (AGE 60); Fracture surgery (AGE 89); Hernia repair (AGE 60); Colonoscopy (2009); Tonsillectomy and adenoidectomy (1954); Cardiac defibrillator placement (12/2012); Cardiac pacemaker placement (2014); EGD (1975); EGD, COLONOSCOPY (N/A, 04/04/2018); ECHOCARDIOGRAM, TRANSTHORACIC (02/2016 11/2016 09/2017 12/2017); MPI nuclear study (06/2016); Correction hammer toe; BIV (11/10/2016); ECHOCARDIOGRAM, TRANSTHORACIC (02/2016,11/2016,12/20/2017); Cardiac catheterization (03/2010); and Cardiac catheterization (12/2012).    ALLERGIES: No Known Allergies    MEDICATIONS:   Patient's current medications were reviewed. ONLY Sleep Medicine related medications were updated unless others were addressed in assessment and plan.  Current Outpatient Medications   Medication Instructions    aspirin EC 81 mg, Oral, Daily    atorvastatin (LIPITOR) 40 mg, Oral, At bedtime    carvedilol (COREG) 25 mg, Oral, 2 times daily with meals    docusate sodium (COLACE) 100 MG capsule Daily    empagliflozin (JARDIANCE) 10 mg, Oral, Daily    fluticasone furoate-vilanterol (Breo Ellipta) 100-25 MCG/INH Aerosol Pwdr, Breath Activated 1 puff, Inhalation, Daily    glucose blood (ONE TOUCH ULTRA TEST) test strip 1 each, Other, 2 times daily, Use as instructed    guaiFENesin-codeine (ROBITUSSIN AC) 100-10 MG/5ML syrup 5 mLs, Oral, 3 times daily PRN    insulin glargine (LANTUS SOLOSTAR) 100 UNIT/ML injection pen Patient takes 50 units at night    metOLazone (ZAROXOLYN) 2.5 mg, Oral, weekly    niacin (NIASPAN) 500 MG CR tablet TAKE 1 TABLET BY MOUTH TWICE DAILY    PARoxetine (PAXIL) 20 mg, Oral, Every morning    sacubitril-valsartan (Entresto) 97-103 MG Tab per tablet 1 tablet, Oral, 2 times daily    Semglee, yfgn, 100 UNIT/ML Solution Pen-injector  ADMINISTER 50 UNITS UNDER THE SKIN AT BEDTIME    SITagliptin-metFORMIN  (Janumet) 50-1000 MG tablet 1 tablet, Oral, 2 times daily with meals    spironolactone (ALDACTONE) 12.5 mg, Oral, Daily    tamsulosin (FLOMAX) 0.4 MG Cap TAKE 1 CAPSULE(0.4 MG) BY MOUTH DAILY AFTER DINNER    torsemide (DEMADEX) 40 mg, Oral, Every morning       FAMILY HISTORY: family history includes Breast cancer in his mother; Coronary artery disease in his mother; Diabetes in his brother and mother; Heart attack in his father; Heart attack (age of onset: 88) in his mother; Hypertension in his father and mother.    SOCIAL HISTORY: He reports that he has never smoked. He has never used smokeless tobacco. He reports that he does not drink alcohol and does not use drugs.      PHYSICAL EXAM:    There were no vitals taken for this visit.     Constitutional: Well developed, well nourished, in no acute distress, alert  Skin: no apparent skin lesions    Head: normocephalic   Eyes: conjunctivae and lids normal   Chest: normal respiratory effort     Psychiatric:  normal memory   Neurological: affect appropriate    IMPRESSION:    1.  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  Moderate to severe snoring.  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  Best results not achieved per SPLIT CPAP/BiPAP 07/04/2021.    A.  Recommendation for BiPAP S/T due to low EF of 25 to 30%.  Not eligible for ASV titration.   B.  PSG: Elevated CAI 39.2/h. New onset of REM 26% on PAP despite residual apnea.    C.  Symptomatic snoring, nocturnal dyspnea, restless sleeper, difficulty staying asleep, sleep hallucination, daytime drowsiness with ESS of 11.   D. Tonsils removed.     2.  Elevated BMI.    3. Chronic systolic heart failure due to nonischemic cardiomyopathy, ACC/AHA stage C, NYHA class 3. The last LVEF was 25-30% by echo August 2022. Nonocclusive CAD by prior cath in 2014. Status post Medtronic  biventricular ICD.     4.  Sleep-related bruxism captured during SPLIT study.    5.  COPD.    RECOMMENDATION:    Cardiology notes were reviewed. We discussed the pathophysiology of sleep apnea as it relates to the cardiovascular system and how treating sleep apnea (when moderate to severe) can help confer reduced future cardiovascular risks.         Do not to drive or engage in other tasks requiring sustained vigilance if feeling sleepy.                                                   No orders of the defined types were placed in this encounter.      No orders of the defined types were placed in this encounter.        SIGNED:    Serita Grammes, FNP        This note was generated by the Dragon speech recognition and may contain errors or omissions not intended by the user. Grammatical errors, random word insertions, deletions, pronoun errors, and incomplete sentences are occasional consequences of this technology due to  software limitations. Not all errors are caught or corrected. If there are questions or concerns about the content of this note or information contained within the body of this dictation, they should be addressed directly with the author for clarification.

## 2021-10-07 NOTE — Progress Notes (Deleted)
SLEEP MEDICINE OFFICE PROGRESS NOTE    HRT [facility] OFFICE      [facility] OFFICE -CARDIOLOGY  [address]  [place] [address]  Dept: [phone]  Dept Fax: [phone]       Patient Name: [name] [name]    Date of Visit:  October 07, 2021  Date of Birth: 05/12/1946  AGE: 75 y.o.  Medical Record #: [id number]  Requesting Physician: Zorita Pang, MD    CHIEF COMPLAINT:  No chief complaint on file.       HISTORY OF PRESENT ILLNESS:    [name] [name] is a pleasant 75 y.o. male who presents for sleep medicine follow up. He reported symptomatic snoring, nocturnal dyspnea, restless sleeper, difficulty staying asleep, sleep hallucination, daytime drowsiness with ESS of 11. 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  Bruxism detected.  No significant periodic limb movements.  Recommendation to return for a full night of BiPAP S/T titration given that he is not eligible for ASV for low LVEF < 45%.      PAST MEDICAL HISTORY: He has a past medical history of Acute CHF, Acute systolic (congestive) heart failure (08/2017), Anxiety, CAD (coronary artery disease), Cardiomyopathy, CHF (congestive heart failure) (08/12/2014, 2013), Chronic obstructive pulmonary disease, Coronary artery disease (2011), Depression, echocardiogram (02/2016, 11/2016, 09/2017, 12/20/2017), GERD (gastroesophageal reflux disease), Heart attack (2011), Hyperlipemia, Hypertension, ICD (implantable 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, Ischemic cardiomyopathy, Nonischemic cardiomyopathy, NSTEMI (non-ST elevated myocardial infarction), Nuclear MPI (06/2016), Pacemaker (2014), Pneumonia (09/2016), Primary cardiomyopathy, Sepsis (08/2017), Syncope and collapse, Type 2 diabetes mellitus, controlled (DX 1998),  and Wheeze. He has a past surgical history that includes duodenal ulcer (1973); orthopedic surgery (AGE 48); EGD (N/A, 08/15/2014); Circumcision (AGE 73); Fracture surgery (AGE 47); Hernia repair (AGE 73); Colonoscopy (2009); Tonsillectomy and adenoidectomy (1954); Cardiac defibrillator placement (12/2012); Cardiac pacemaker placement (2014); EGD (1975); EGD, COLONOSCOPY (N/A, 04/04/2018); ECHOCARDIOGRAM, TRANSTHORACIC (02/2016 11/2016 09/2017 12/2017); MPI nuclear study (06/2016); Correction hammer toe; BIV (11/10/2016); ECHOCARDIOGRAM, TRANSTHORACIC (02/2016,11/2016,12/20/2017); Cardiac catheterization (03/2010); and Cardiac catheterization (12/2012).    ALLERGIES: No Known Allergies    MEDICATIONS:   Patient's current medications were reviewed. ONLY Sleep Medicine related medications were updated unless others were addressed in assessment and plan.  Current Outpatient Medications   Medication Instructions    aspirin EC 81 mg, Oral, Daily    atorvastatin (LIPITOR) 40 mg, Oral, At bedtime    carvedilol (COREG) 25 mg, Oral, 2 times daily with meals    docusate sodium (COLACE) 100 MG capsule Daily    empagliflozin (JARDIANCE) 10 mg, Oral, Daily    fluticasone furoate-vilanterol (Breo Ellipta) 100-25 MCG/INH Aerosol Pwdr, Breath Activated 1 puff, Inhalation, Daily    glucose blood (ONE TOUCH ULTRA TEST) test strip 1 each, Other, 2 times daily, Use as instructed    guaiFENesin-codeine (ROBITUSSIN AC) 100-10 MG/5ML syrup 5 mLs, Oral, 3 times daily PRN    insulin glargine (LANTUS SOLOSTAR) 100 UNIT/ML injection pen Patient takes 50 units at night    metOLazone (ZAROXOLYN) 2.5 mg, Oral, weekly    niacin (NIASPAN) 500 MG CR tablet TAKE 1 TABLET BY MOUTH TWICE DAILY    PARoxetine (PAXIL) 20 mg, Oral, Every morning    sacubitril-valsartan (Entresto) 97-103 MG Tab per tablet 1 tablet, Oral, 2 times daily    Semglee, yfgn, 100 UNIT/ML Solution Pen-injector  ADMINISTER 50 UNITS UNDER THE SKIN AT BEDTIME    SITagliptin-metFORMIN  (Janumet) 50-1000 MG tablet 1 tablet, Oral, 2 times daily with meals    spironolactone (ALDACTONE) 12.5 mg, Oral, Daily    tamsulosin (FLOMAX) 0.4 MG Cap TAKE 1 CAPSULE(0.4 MG) BY MOUTH DAILY AFTER DINNER    torsemide (DEMADEX) 40 mg, Oral, Every morning       FAMILY HISTORY: family history includes Breast cancer in his mother; Coronary artery disease in his mother; Diabetes in his brother and mother; Heart attack in his father; Heart attack (age of onset: 22) in his mother; Hypertension in his father and mother.    SOCIAL HISTORY: He reports that he has never smoked. He has never used smokeless tobacco. He reports that he does not drink alcohol and does not use drugs.      PHYSICAL EXAM:    There were no vitals taken for this visit.     Constitutional: Well developed, well nourished, in no acute distress, alert  Skin: no apparent skin lesions    Head: normocephalic   Eyes: conjunctivae and lids normal   Chest: normal respiratory effort     Psychiatric:  normal memory   Neurological: affect appropriate    IMPRESSION:    1.  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  Moderate to severe snoring.  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  Best results not achieved per SPLIT CPAP/BiPAP 07/04/2021.    A.  Recommendation for BiPAP S/T due to low EF of 25 to 30%.  Not eligible for ASV titration.   B.  PSG: Elevated CAI 39.2/h. New onset of REM 26% on PAP despite residual apnea.    C.  Symptomatic snoring, nocturnal dyspnea, restless sleeper, difficulty staying asleep, sleep hallucination, daytime drowsiness with ESS of 11.   D. Tonsils removed.     2.  Elevated BMI.    3. Chronic systolic heart failure due to nonischemic cardiomyopathy, ACC/AHA stage C, NYHA class 3. The last LVEF was 25-30% by echo August 2022. Nonocclusive CAD by prior cath in 2014. Status post Medtronic  biventricular ICD.  Followed by [facility] general cardiology and heart failure clinic.    4.  Sleep-related bruxism captured during SPLIT study.    5.  COPD.    RECOMMENDATION:    Cardiology notes were reviewed. We discussed the pathophysiology of sleep apnea as it relates to the cardiovascular system and how treating sleep apnea (when moderate to severe) can help confer reduced future cardiovascular risks.         Do not to drive or engage in other tasks requiring sustained vigilance if feeling sleepy.                                                   No orders of the defined types were placed in this encounter.      No orders of the defined types were placed in this encounter.        SIGNED:    Serita Grammes, FNP        This note was generated by the Dragon speech recognition and may contain errors or omissions not intended by the user. Grammatical errors, random word insertions, deletions, pronoun errors, and  incomplete sentences are occasional consequences of this technology due to software limitations. Not all errors are caught or corrected. If there are questions or concerns about the content of this note or information contained within the body of this dictation, they should be addressed directly with the author for clarification.

## 2021-10-13 DIAGNOSIS — Z95810 Presence of automatic (implantable) cardiac defibrillator: Secondary | ICD-10-CM

## 2021-10-28 ENCOUNTER — Telehealth (INDEPENDENT_AMBULATORY_CARE_PROVIDER_SITE_OTHER): Payer: Self-pay | Admitting: Family Medicine

## 2021-10-28 DIAGNOSIS — E118 Type 2 diabetes mellitus with unspecified complications: Secondary | ICD-10-CM

## 2021-10-28 NOTE — Telephone Encounter (Signed)
Pt is requesting a refill    SITagliptin-metFORMIN (Janumet) 50-1000 MG tablet    Pharmacy:    [id number] - [place], [place] - [address] AT [facility] [address] [facility] Phone:  [phone]   Fax:  [phone]

## 2021-10-29 MED ORDER — JANUMET 50-1000 MG PO TABS
1.0000 | ORAL_TABLET | Freq: Two times a day (BID) | ORAL | 3 refills | Status: DC
Start: 2021-10-29 — End: 2022-01-08

## 2021-11-11 ENCOUNTER — Other Ambulatory Visit (INDEPENDENT_AMBULATORY_CARE_PROVIDER_SITE_OTHER): Payer: Commercial Managed Care - POS | Admitting: Cardiovascular Disease

## 2021-11-11 LAB — REMOTE CARDIAC DEVICE MONITORING
AF Burden Percentage: 0
ICD Shock Recent Count: 0
RV Pacing Percentage: 2.71

## 2021-11-12 ENCOUNTER — Ambulatory Visit (INDEPENDENT_AMBULATORY_CARE_PROVIDER_SITE_OTHER): Payer: Commercial Managed Care - POS | Admitting: Cardiovascular Disease

## 2021-11-12 ENCOUNTER — Encounter (INDEPENDENT_AMBULATORY_CARE_PROVIDER_SITE_OTHER): Payer: Self-pay | Admitting: Cardiovascular Disease

## 2021-11-12 VITALS — BP 122/82 | HR 89 | Ht 72.0 in | Wt 213.4 lb

## 2021-11-12 DIAGNOSIS — I428 Other cardiomyopathies: Secondary | ICD-10-CM

## 2021-11-12 DIAGNOSIS — I5022 Chronic systolic (congestive) heart failure: Secondary | ICD-10-CM

## 2021-11-12 MED ORDER — TORSEMIDE 20 MG PO TABS
ORAL_TABLET | ORAL | 3 refills | Status: DC
Start: 2021-11-12 — End: 2022-03-23

## 2021-11-12 NOTE — Progress Notes (Signed)
[place] [facility] ADVANCED HEART FAILURE SUB-SPECIALTY FOLLOW-UP CONSULTATION NOTE    HRT [facility] OFFICE -CARDIOLOGY  [address]  [place] [address]  Dept: [phone]  Dept Fax: [phone]     Patient Name: [name] [name]    Date of Visit:  November 12, 2021  Date of Birth: 1946-07-29  AGE: 76 y.o.  Medical Record #: [id number]  Requesting Physician: Zorita Pang, MD    CHIEF COMPLAINT:  No chief complaint on file.      HISTORY OF PRESENT ILLNESS:    I had the pleasure of seeing Mr. Dylon Correa today as a follow up visit in the Advanced Heart Failure Clinic at [facility]. He is a pleasant 76 y.o. 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  Is noted that his ejection fraction was 15% on echocardiogram in May 2021.  He has grade 3 diastolic dysfunction and mild MR and TR.  He has a Medtronic BiV ICD.  His Coreg 25 mg twice daily, Entresto 97/103 mg twice daily, and Spironolactone 12.5 mg daily.  He is on torsemide 40 mg twice daily.     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     He reports increased shortness of breath during the last 3 to 4 days.  His weight has increased between visits.  He previously weighed 205 pounds now he weighs 224 pounds.  There is been no missed medications or significant change in diet.    PAST MEDICAL HISTORY: He has a past medical history of Acute CHF, Acute systolic (congestive) heart failure (08/2017), Anxiety, CAD (coronary artery disease), Cardiomyopathy, CHF (congestive heart failure) (08/12/2014, 2013), Chronic obstructive pulmonary disease, Coronary artery disease (2011), Depression, echocardiogram (02/2016, 11/2016, 09/2017, 12/20/2017), GERD (gastroesophageal reflux disease), Heart attack (2011), Hyperlipemia, Hypertension, ICD (implantable 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, Ischemic cardiomyopathy, Nonischemic cardiomyopathy, NSTEMI (non-ST elevated myocardial  infarction), 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 He has a past surgical history that includes duodenal ulcer (1973); orthopedic surgery (AGE 47); EGD (N/A, 08/15/2014); Circumcision (AGE 44); Fracture surgery (AGE 54); Hernia repair (AGE 44); Colonoscopy (2009); Tonsillectomy and adenoidectomy (1954); Cardiac defibrillator placement (12/2012); Cardiac pacemaker placement (2014); EGD (1975); EGD, COLONOSCOPY (N/A, 04/04/2018); ECHOCARDIOGRAM, TRANSTHORACIC (02/2016 11/2016 09/2017 12/2017); MPI nuclear study (06/2016); Correction hammer toe; BIV (11/10/2016); ECHOCARDIOGRAM, TRANSTHORACIC (02/2016,11/2016,12/20/2017); Cardiac catheterization (03/2010); and Cardiac catheterization (12/2012).    ALLERGIES: No Known Allergies    MEDICATIONS:   Current Outpatient Medications:     aspirin EC 81 MG EC tablet, Take 81 mg by mouth daily, Disp: , Rfl:     atorvastatin (LIPITOR) 40 MG tablet, Take 1 tablet (40 mg total) by mouth nightly, Disp: 90 tablet, Rfl: 0    carvedilol (COREG) 25 MG tablet, Take 1 tablet (25 mg total) by mouth 2 (two) times daily with meals, Disp: 180 tablet, Rfl: 3    docusate sodium (COLACE) 100 MG capsule, Take by mouth daily (Patient not taking: Reported on 09/04/2021), Disp: , Rfl:     empagliflozin (JARDIANCE) 10 MG tablet, Take 1 tablet (10 mg total) by mouth daily, Disp: 90 tablet, Rfl: 3    fluticasone furoate-vilanterol (Breo Ellipta) 100-25 MCG/INH Aerosol Pwdr, Breath Activated, Inhale 1 puff into the lungs daily, Disp: 3 each, Rfl: 3    glucose blood (ONE TOUCH ULTRA TEST) test strip, 1 each by Other route 2 (two)  times daily Use as instructed, Disp: 300 each, Rfl: 1    guaiFENesin-codeine (ROBITUSSIN AC) 100-10 MG/5ML syrup, Take 5 mLs by mouth 3 (three) times daily as needed for Cough (Patient not taking: Reported on 09/04/2021), Disp: 118 mL, Rfl:  0    insulin glargine (LANTUS SOLOSTAR) 100 UNIT/ML injection pen, Patient takes 50 units at night, Disp: 45 mL, Rfl: 3    metOLazone (ZAROXOLYN) 2.5 MG tablet, Take 1 tablet (2.5 mg total) by mouth once a week, Disp: 12 tablet, Rfl: 3    niacin (NIASPAN) 500 MG CR tablet, TAKE 1 TABLET BY MOUTH TWICE DAILY, Disp: 180 tablet, Rfl: 3    PARoxetine (Paxil) 20 MG tablet, Take 1 tablet (20 mg total) by mouth every morning, Disp: 30 tablet, Rfl: 11    sacubitril-valsartan (Entresto) 97-103 MG Tab per tablet, Take 1 tablet by mouth 2 (two) times daily, Disp: 180 tablet, Rfl: 3    Semglee, yfgn, 100 UNIT/ML Solution Pen-injector, ADMINISTER 50 UNITS UNDER THE SKIN AT BEDTIME, Disp: , Rfl:     SITagliptin-metFORMIN (Janumet) 50-1000 MG tablet, Take 1 tablet by mouth 2 (two) times daily with meals, Disp: 180 tablet, Rfl: 3    spironolactone (ALDACTONE) 25 MG tablet, Take 0.5 tablets (12.5 mg total) by mouth daily, Disp: 45 tablet, Rfl: 3    tamsulosin (FLOMAX) 0.4 MG Cap, TAKE 1 CAPSULE(0.4 MG) BY MOUTH DAILY AFTER DINNER, Disp: 90 capsule, Rfl: 3    torsemide (Demadex) 20 MG tablet, Take 2 tablets (40 mg total) by mouth every morning, Disp: 90 tablet, Rfl: 0   Patient's current medications were reviewed. ONLY Cardiac medications were updated unless others were addressed in assessment and plan.    FAMILY HISTORY: family history includes Breast cancer in his mother; Coronary artery disease in his mother; Diabetes in his brother and mother; Heart attack in his father; Heart attack (age of onset: 76) in his mother; Hypertension in his father and mother.    SOCIAL HISTORY: He reports that he has never smoked. He has never used smokeless tobacco. He reports that he does not drink alcohol and does not use drugs.    PHYSICAL EXAMINATION  There were no vitals taken for this visit.    Last 3 Weights:   Weight Monitoring 06/04/2021 06/11/2021 09/04/2021   Height 185.4 cm 182.9 cm 182.9 cm   Height Method - - -   Weight 92.534 kg 94.167  kg 96.798 kg   Weight Method - - -   BMI (calculated) 27 kg/m2 28.2 kg/m2 29 kg/m2       General Appearance:  A well-appearing male in no acute distress.    Skin: Warm and dry to touch, no apparent skin lesions  Head: Normocephalic, normal hair pattern  Eyes: EOMS Intact, conjunctivae and lids unremarkable.    ENT: Ears, Nose and throat reveal no gross abnormalities.  No pallor or cyanosis.   Neck: JVP is normal  Chest: Clear to auscultation bilaterally with good air movement and respiratory effort and mild wheezes, rales, or rhonchi   Cardiovascular: Regular rhythm, S1 normal, S2 normal, No S3 or S4. Apical impulse not displaced. No murmur. No gallops or rubs detected   Abdomen: Soft, nontender, nondistended, with normoactive bowel sounds. No organomegaly.    Extremities: Warm without edema. No clubbing, or cyanosis.  Radial pulses are full and equal.   Neuro: Alert and oriented x3. No gross motor or sensory deficits noted, affect appropriate.      LABS:   Lab  Results   Component Value Date    WBC 38.09 (HH) 05/05/2021    HGB 14.0 05/05/2021    HCT 44.6 05/05/2021    PLT 111 (L) 05/05/2021     Lab Results   Component Value Date    GLU 57 (L) 09/17/2021    BUN 19.0 09/17/2021    CREAT 1.7 (H) 09/17/2021    NA 145 09/17/2021    K 4.7 09/17/2021    CL 107 09/17/2021    CO2 28 09/17/2021    CA 9.7 09/17/2021    PROT 6.8 05/05/2021    AST 16 05/05/2021    ALT 10 05/05/2021    BILITOTAL 0.9 05/05/2021    GLOB 2.8 05/05/2021    ALB 4.0 05/05/2021     Lab Results   Component Value Date    TSH 0.74 08/12/2014    CK 152 02/17/2021    BNP 231.4 (H) 02/19/2021    BNP 1,354.5 (H) 02/17/2021    BNP 956.2 (H) 09/18/2017    HGBA1C 5.9 05/05/2021     Lab Results   Component Value Date    CHOL 149 05/05/2021    CHOL 185 01/13/2021    TRIG 71 05/05/2021    TRIG 96 01/13/2021    HDL 38 (L) 05/05/2021    HDL 36 (L) 01/13/2021    LDL 97 05/05/2021    LDL 130 (H) 01/13/2021    LABVLDL 14 05/05/2021    LABVLDL 19 01/13/2021     CHOLHDLRATIO 3.9 05/05/2021    CHOLHDLRATIO 5.1 01/13/2021     TTE, August 2022:  1. Mildly dilated left ventricle with severely reduced systolic function, LVEF 25-30% visually.  2. Calculated ejection fraction, using modified Simpsons (Method of Discs) is 31%.  3. Grade III diastolic dysfunction.  4. The left atrium is moderately dilated by LAVI criteria (42-48 mL/M2)  5. Moderate mitral valve regurgitation.  6. Trace aortic valve regurgitation.  7. Moderate tricuspid valve regurgitation.  8. 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  9. Compared to the previous study in July 2021, the LVEF is comparable, but the degree of MR and TR  have increased, and the PA pressures are now moderately elevated.    IMPRESSION / RECOMMENDATIONS:   Mr. Knoble a 76 y.o.male with chronic systolic heart failure due to nonischemic cardiomyopathy, ACC/AHA stage C, NYHA class 3. Today he is feeling well and is euvolemic on exam.  The last LVEF was 15% demonstrated by echocardiogram in May 2021 and 20-25% in July 2021.     2   Goal directed therapy includes:  Beta blocker: Carvedilol 25 mg twice daily  ACE/ARB/ARNI: Entresto 97/103 mg twice daily  MRA: Spironolactone 12.5 mg daily  SGLT2i:  jardiance 10mg  daily     3   Current diuretic regimen: torsemide 40 mg in the morning and 40 mg in the afternoon   Adding metolazone 2.5 mg once weekly     Sleep study ordered     Next follow up will be with Conception Oms, NP in 3 months.                                                 No orders of the defined types were placed in this encounter.      No orders of the defined types were placed in this encounter.  SIGNED:    Marianna Fuss, MD    This note was generated by the Dragon speech recognition and may contain errors or omissions not intended by the user. Grammatical errors, random word insertions, deletions, pronoun errors, and incomplete sentences are occasional consequences of this technology due to software  limitations. Not all errors are caught or corrected. If there are questions or concerns about the content of this note or information contained within the body of this dictation, they should be addressed directly with the author for clarification.

## 2021-11-12 NOTE — Progress Notes (Signed)
[place] [facility] ADVANCED HEART FAILURE OFFICE PROGRESS NOTE    [facility]  [facility]  [address]  [place] [id number]  Dept: [phone]  Dept Fax: [phone]       Patient Name: [name] MichaelisWILLIAMS,[name] [name]    Date of Visit:  November 12, 2021  Date of Birth: 11/03/1945  AGE: 76 y.o.  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 [name] MichaelisBruce [name] Tweedy today in follow up in the [facility]. He is a pleasant 76 y.o. 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 Last seen in office back in August. He was started on Metolazone 2.5mg  once a week. Since then, he did notice some weight loss however he did gain some weight in the past couple of weeks. For HF GDMT, he is on carvedilol 25 mg twice daily, Entresto 97/23 mg twice daily, Jardiance 10 mg daily, and spironolactone 12.5 mg daily.      He completed echocardiogram in August 2022 showing EF 25 to 30%, grade 3 diastolic dysfunction, moderate mitral valve regurgitation, moderate tricuspid valve regurgitation, moderate pulmonary hypertension, and trace aortic valve regurgitation.  Denies any significant shortness of breath, dizziness, orthopnea, PND or edema. Weight stable around 210-214lbs on home scale. He is on torsemide 40mg  daily along with metolazone 2.5mg  once a week.       PAST MEDICAL HISTORY: He has a past medical history of Acute CHF, Acute systolic (congestive) heart failure (08/2017), Anxiety, CAD (coronary artery disease), Cardiomyopathy, CHF (congestive heart failure) (08/12/2014, 2013), Chronic obstructive pulmonary disease, Coronary artery disease (2011), Depression,  echocardiogram (02/2016, 11/2016, 09/2017, 12/20/2017), GERD (gastroesophageal reflux disease), Heart attack (2011), Hyperlipemia, Hypertension, ICD (implantable 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, Ischemic cardiomyopathy, Nonischemic cardiomyopathy, NSTEMI (non-ST elevated myocardial infarction), 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 He has a past surgical history that includes duodenal ulcer (1973); orthopedic surgery (AGE 2); EGD (N/A, 08/15/2014); Circumcision (AGE 58); Fracture surgery (AGE 78); Hernia repair (AGE 58); Colonoscopy (2009); Tonsillectomy and adenoidectomy (1954); Cardiac defibrillator placement (12/2012); Cardiac pacemaker placement (2014); EGD (1975); EGD, COLONOSCOPY (N/A, 04/04/2018); ECHOCARDIOGRAM, TRANSTHORACIC (02/2016 11/2016 09/2017 12/2017); MPI nuclear study (06/2016); Correction hammer toe; BIV (11/10/2016); ECHOCARDIOGRAM, TRANSTHORACIC (02/2016,11/2016,12/20/2017); Cardiac catheterization (03/2010); and Cardiac catheterization (12/2012).    ALLERGIES: No Known Allergies    MEDICATIONS:   Current Outpatient Medications:     aspirin EC 81 MG EC tablet, Take 81 mg by mouth daily, Disp: , Rfl:     atorvastatin (LIPITOR) 40 MG tablet, Take 1 tablet (40 mg total) by mouth nightly, Disp: 90 tablet, Rfl: 0    carvedilol (COREG) 25 MG tablet, Take 1 tablet (25 mg total) by mouth 2 (two) times daily with meals, Disp: 180 tablet, Rfl: 3    empagliflozin (JARDIANCE) 10 MG tablet, Take 1 tablet (10 mg total) by mouth daily, Disp: 90 tablet, Rfl: 3    fluticasone furoate-vilanterol (Breo Ellipta) 100-25 MCG/INH Aerosol Pwdr, Breath Activated, Inhale 1 puff into the lungs daily, Disp: 3 each, Rfl:  3    glucose blood (ONE TOUCH ULTRA TEST) test strip, 1 each by Other route 2 (two) times daily Use as instructed, Disp: 300 each, Rfl: 1    insulin glargine  (LANTUS SOLOSTAR) 100 UNIT/ML injection pen, Patient takes 50 units at night, Disp: 45 mL, Rfl: 3    metOLazone (ZAROXOLYN) 2.5 MG tablet, Take 1 tablet (2.5 mg total) by mouth once a week, Disp: 12 tablet, Rfl: 3    niacin (NIASPAN) 500 MG CR tablet, TAKE 1 TABLET BY MOUTH TWICE DAILY, Disp: 180 tablet, Rfl: 3    PARoxetine (Paxil) 20 MG tablet, Take 1 tablet (20 mg total) by mouth every morning, Disp: 30 tablet, Rfl: 11    sacubitril-valsartan (Entresto) 97-103 MG Tab per tablet, Take 1 tablet by mouth 2 (two) times daily, Disp: 180 tablet, Rfl: 3    Semglee, yfgn, 100 UNIT/ML Solution Pen-injector, ADMINISTER 50 UNITS UNDER THE SKIN AT BEDTIME, Disp: , Rfl:     SITagliptin-metFORMIN (Janumet) 50-1000 MG tablet, Take 1 tablet by mouth 2 (two) times daily with meals, Disp: 180 tablet, Rfl: 3    spironolactone (ALDACTONE) 25 MG tablet, Take 0.5 tablets (12.5 mg total) by mouth daily, Disp: 45 tablet, Rfl: 3    tamsulosin (FLOMAX) 0.4 MG Cap, TAKE 1 CAPSULE(0.4 MG) BY MOUTH DAILY AFTER DINNER, Disp: 90 capsule, Rfl: 3    torsemide (Demadex) 20 MG tablet, Take 2 tablets (40 mg total) by mouth every morning, Disp: 90 tablet, Rfl: 0     FAMILY HISTORY: family history includes Breast cancer in his mother; Coronary artery disease in his mother; Diabetes in his brother and mother; Heart attack in his father; Heart attack (age of onset: 5) in his mother; Hypertension in his father and mother.    SOCIAL HISTORY: He reports that he has never smoked. He has never used smokeless tobacco. He reports that he does not drink alcohol and does not use drugs.      PHYSICAL EXAMINATION    Visit Vitals  Blood Pressure 122/82 (BP Site: Left arm, Patient Position: Sitting, Cuff Size: Medium)   Pulse 89   Height 1.829 m (6')   Weight 96.8 kg (213 lb 6.4 oz)   Oxygen Saturation 97%   Body Mass Index 28.94 kg/m       Last 3 Weights:   Weight Monitoring 06/11/2021 09/04/2021 11/12/2021   Height 182.9 cm 182.9 cm 182.9 cm   Height Method - - -    Weight 94.167 kg 96.798 kg 96.798 kg   Weight Method - - -   BMI (calculated) 28.2 kg/m2 29 kg/m2 29 kg/m2         General Appearance:  Cooperative and in no acute distress.    Skin: Warm and dry to touch  Head: Normocephalic  Eyes: Conjunctivae and lids unremarkable.    ENT: wearing mask   Neck: mild JVD  Chest: Clear to auscultation bilaterally with good respiratory effort   Cardiovascular: Regular rhythm, S1 normal, S2 normal, No S3 or S4  Abdomen: Soft, nontender with normoactive bowel sounds.   Extremities: Warm/dry without edema, +2 b/l radial pulses  Neuro: Alert and oriented x3. No gross motor or sensory deficits noted, affect appropriate.          IMPRESSION / RECOMMENDATIONS:   Mr. Kutzer is a 76 y.o. male with chronic systolic heart failure due to nonischemic cardiomyopathy, ACC/AHA stage C, NYHA class 3. The last LVEF was 25-30% by echo August 2022. Nonocclusive CAD by prior  cath in 2014  Status post Medtronic biventricular ICD  Goal directed therapy includes:  Beta blocker: Carvedilol 25 mg twice daily  ACE/ARB/ARNI: Entresto 97/103 mg twice daily  MRA: Spironolactone 12.5 mg daily  SGLT2i:  Jardiance 10mg  daily     Recommended diuretic regimen starting tomorrow: Torsemide to 40 mg in AM and additional 20mg  in afternoon. He is also on Metolazone 2.5mg  once a week.   Repeat BMP to reassess renal function and electrolytes   Monitor BP/HR and weights daily   Consider increase Spironolactone at next visit   Follow up with our AHF clinic in 3 months, or sooner if needed                                                   No orders of the defined types were placed in this encounter.      No orders of the defined types were placed in this encounter.        SIGNED:    , MD          This note was generated by the Dragon speech recognition and may contain errors or omissions not intended by the user. Grammatical errors, random word insertions, deletions, pronoun errors, and incomplete sentences  are occasional consequences of this technology due to software limitations. Not all errors are caught or corrected. If there are questions or concerns about the content of this note or information contained within the body of this dictation, they should be addressed directly with the author for clarification.

## 2021-11-20 ENCOUNTER — Encounter (INDEPENDENT_AMBULATORY_CARE_PROVIDER_SITE_OTHER): Payer: Commercial Managed Care - POS | Admitting: Family

## 2021-11-24 ENCOUNTER — Ambulatory Visit (INDEPENDENT_AMBULATORY_CARE_PROVIDER_SITE_OTHER): Payer: Commercial Managed Care - POS | Admitting: Family Medicine

## 2021-11-24 ENCOUNTER — Encounter (INDEPENDENT_AMBULATORY_CARE_PROVIDER_SITE_OTHER): Payer: Self-pay | Admitting: Family Medicine

## 2021-11-24 ENCOUNTER — Other Ambulatory Visit (FREE_STANDING_LABORATORY_FACILITY): Payer: Commercial Managed Care - POS

## 2021-11-24 VITALS — BP 106/72 | HR 85 | Temp 97.2°F | Resp 22 | Wt 222.4 lb

## 2021-11-24 DIAGNOSIS — I5022 Chronic systolic (congestive) heart failure: Secondary | ICD-10-CM

## 2021-11-24 DIAGNOSIS — E118 Type 2 diabetes mellitus with unspecified complications: Secondary | ICD-10-CM

## 2021-11-24 DIAGNOSIS — I10 Essential (primary) hypertension: Secondary | ICD-10-CM

## 2021-11-24 DIAGNOSIS — R051 Acute cough: Secondary | ICD-10-CM

## 2021-11-24 DIAGNOSIS — I428 Other cardiomyopathies: Secondary | ICD-10-CM

## 2021-11-24 DIAGNOSIS — Z794 Long term (current) use of insulin: Secondary | ICD-10-CM

## 2021-11-24 LAB — COMPREHENSIVE METABOLIC PANEL
ALT: 14 U/L (ref 0–55)
AST (SGOT): 17 U/L (ref 5–41)
Albumin/Globulin Ratio: 1.5 (ref 0.9–2.2)
Albumin: 4 g/dL (ref 3.5–5.0)
Alkaline Phosphatase: 72 U/L (ref 37–117)
Anion Gap: 8 (ref 5.0–15.0)
BUN: 24 mg/dL (ref 9.0–28.0)
Bilirubin, Total: 0.5 mg/dL (ref 0.2–1.2)
CO2: 19 mEq/L (ref 17–29)
Calcium: 8.7 mg/dL (ref 7.9–10.2)
Chloride: 112 mEq/L — ABNORMAL HIGH (ref 99–111)
Creatinine: 1.6 mg/dL — ABNORMAL HIGH (ref 0.5–1.5)
Globulin: 2.6 g/dL (ref 2.0–3.6)
Glucose: 97 mg/dL (ref 70–100)
Potassium: 5.1 mEq/L (ref 3.5–5.3)
Protein, Total: 6.6 g/dL (ref 6.0–8.3)
Sodium: 139 mEq/L (ref 135–145)

## 2021-11-24 LAB — MICROALBUMIN, RANDOM URINE
Urine Creatinine, Random: 155.8 mg/dL
Urine Microalbumin, Random: 238 ug/ml — ABNORMAL HIGH (ref 0.0–30.0)
Urine Microalbumin/Creatinine Ratio: 153 ug/mg — ABNORMAL HIGH (ref 0–30)

## 2021-11-24 LAB — HEMOLYSIS INDEX
Hemolysis Index: 9 Index (ref 0–24)
Hemolysis Index: 35 Index — ABNORMAL HIGH (ref 0–24)

## 2021-11-24 LAB — BASIC METABOLIC PANEL
Anion Gap: 8 (ref 5.0–15.0)
BUN: 24 mg/dL (ref 9.0–28.0)
CO2: 25 mEq/L (ref 17–29)
Calcium: 9 mg/dL (ref 7.9–10.2)
Chloride: 110 mEq/L (ref 99–111)
Creatinine: 1.7 mg/dL — ABNORMAL HIGH (ref 0.5–1.5)
Glucose: 122 mg/dL — ABNORMAL HIGH (ref 70–100)
Potassium: 5.3 mEq/L (ref 3.5–5.3)
Sodium: 143 mEq/L (ref 135–145)

## 2021-11-24 LAB — GFR
EGFR: 47.8
EGFR: 51.3

## 2021-11-24 MED ORDER — HYDROCOD POLI-CHLORPHE POLI ER 10-8 MG/5ML PO SUER
5.0000 mL | Freq: Two times a day (BID) | ORAL | 0 refills | Status: DC
Start: 2021-11-24 — End: 2022-03-20

## 2021-11-24 NOTE — Progress Notes (Signed)
Have you seen any specialists since your last visit with [place]?  Yes      The patient was informed that the following HM items are still outstanding:   colonoscopy, eye exam, influenza vaccine, shingles vaccine, and TdaP.

## 2021-11-24 NOTE — Progress Notes (Signed)
Subjective:      Date: 11/24/2021 3:44 PM   Patient ID: [name] [name] is a 76 y.o. male.    Chief Complaint:  Chief Complaint   Patient presents with    Cough     Pt is having cough for 2 -[redacted] weeks along with  cold like symptoms, sore throat. No fever, body pain.       HPI:  HPI    DM : Patient is compliant with current medication.  Tries to exercise at least 3 times a week.  Watches type of diet and amount of carbohydrate intake.  Check lab work for diabetes regularly in our clinic.  Average 106    Cough.  Patient is concerned about his cough this is going on for a couple of weeks now.  Patient tells me that the cough is worse at night he has problems sleeping because he is coughing so much.  He tells me that in the past he had Tussionex Pennkinetic and that worked great for him to stop coughing and to help him sleep.  I will refill his Tussionex.    HTN: Hypertension is well controlled at this point.  Patient is checking his blood pressure regularly at least 3-4 times a week.  Blood pressure at home is always in normal range.  Patient avoids adding salt and is doing regular cardiac exercise.  Average 115/80    Problem List:  Patient Active Problem List   Diagnosis    Chest pain    Type 2 diabetes mellitus with complication, with long-term current use of insulin    Heart attack    CAD (coronary artery disease)    Non-ischemic cardiomyopathy    Acute exacerbation of CHF (congestive heart failure)    PUD (peptic ulcer disease)    Elevated LFTs    Acute dyspnea    Elevated d-dimer    CHF (congestive heart failure)    Headaches due to old head injury    Essential hypertension    Pneumonia due to infectious organism, unspecified laterality, unspecified part of lung    Syncope and collapse    AICD (automatic cardioverter/defibrillator) present    Syncope, unspecified syncope type    Pneumonia due to infectious organism    Dyspnea, unspecified type    Cardiomyopathy    Cough    Sepsis    Sepsis, due to unspecified  organism    SOB (shortness of breath)    History of colon polyps    GERD (gastroesophageal reflux disease)    Hypertensive heart disease without heart failure    Atherosclerotic heart disease of native coronary artery without angina pectoris    Heart failure, unspecified    Nonischemic cardiomyopathy    BO (blackout)    Belching    Acute subendocardial infarction, subsequent episode of care    Difficulty sleeping    Dizziness    Encounter for examination for normal comparison and control in clinical research program    Fatigue    Nasal congestion    Paroxysmal ventricular tachycardia    Pure hypercholesterolemia    Tachycardia, unspecified    Chronic systolic congestive heart failure    Encounter for implantable defibrillator reprogramming or check    Episode of recurrent major depressive disorder, unspecified depression episode severity    Stage 3 chronic kidney disease, 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    Anxiety and depression    COPD (chronic obstructive pulmonary disease)    HLD (hyperlipidemia)  Primary central sleep apnea    Sleep apnea, obstructive    Cheyne-Stokes respiration       Current Medications:  Outpatient Medications Marked as Taking for the 11/24/21 encounter (Office Visit) with Zorita Pang, MD   Medication Sig Dispense Refill    aspirin EC 81 MG EC tablet Take 81 mg by mouth daily      atorvastatin (LIPITOR) 40 MG tablet Take 1 tablet (40 mg total) by mouth nightly 90 tablet 0    carvedilol (COREG) 25 MG tablet Take 1 tablet (25 mg total) by mouth 2 (two) times daily with meals 180 tablet 3    empagliflozin (JARDIANCE) 10 MG tablet Take 1 tablet (10 mg total) by mouth daily 90 tablet 3    fluticasone furoate-vilanterol (Breo Ellipta) 100-25 MCG/INH Aerosol Pwdr, Breath Activated Inhale 1 puff into the lungs daily 3 each 3    glucose blood (ONE TOUCH ULTRA TEST) test strip 1 each by Other route 2 (two) times daily Use as instructed 300 each 1    insulin glargine (LANTUS SOLOSTAR) 100  UNIT/ML injection pen Patient takes 50 units at night 45 mL 3    metOLazone (ZAROXOLYN) 2.5 MG tablet Take 1 tablet (2.5 mg total) by mouth once a week 12 tablet 3    niacin (NIASPAN) 500 MG CR tablet TAKE 1 TABLET BY MOUTH TWICE DAILY 180 tablet 3    PARoxetine (Paxil) 20 MG tablet Take 1 tablet (20 mg total) by mouth every morning 30 tablet 11    sacubitril-valsartan (Entresto) 97-103 MG Tab per tablet Take 1 tablet by mouth 2 (two) times daily 180 tablet 3    Semglee, yfgn, 100 UNIT/ML Solution Pen-injector ADMINISTER 50 UNITS UNDER THE SKIN AT BEDTIME      SITagliptin-metFORMIN (Janumet) 50-1000 MG tablet Take 1 tablet by mouth 2 (two) times daily with meals 180 tablet 3    spironolactone (ALDACTONE) 25 MG tablet Take 0.5 tablets (12.5 mg total) by mouth daily 45 tablet 3    torsemide (Demadex) 20 MG tablet Take 2 tablets (40 mg) by mouth every morning AND 1 tablet (20 mg) every evening. 270 tablet 3       Allergies:  No Known Allergies    Past Medical History:  Past Medical History:   Diagnosis Date    Acute CHF     NOV  & DEC 2018, - DIALYSIS CATH PLACED Aug 24 2017 TO REMOVE FLUID     Acute systolic (congestive) heart failure 08/2017    Anxiety     CAD (coronary artery disease)     Cardiomyopathy     nonischemic    CHF (congestive heart failure) 08/12/2014, 2013    Chronic obstructive pulmonary disease     POSSIBLE PER PT HE USES  SYNBICORT BID ABD  BREO INHALER PRN    Coronary artery disease 2011    CORONARY STENT PLACEMENT FOLLOWED BY [facility]    Depression     echocardiogram 02/2016, 11/2016, 09/2017, 12/20/2017    GERD (gastroesophageal reflux disease)     Heart attack 2011    and 2014    Hyperlipemia     Hypertension     ICD (implantable 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     PLACED 2014  [facility]  LAST INTERROGATION April 01 2018 REPORT REQUESTED    Ischemic cardiomyopathy     EF 15% ON ECHO 09-20-2017 DR GARG IN [facility]    Nonischemic cardiomyopathy     NSTEMI (non-ST elevated myocardial infarction)  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    Pacemaker 2014    [employer] ICD/PACEMAKER COMBO LAST INTERROGATION  12-12 2018    Pneumonia 09/2016    Primary cardiomyopathy     Ischemic cardiomyopathy EF 15% ON ECHO 09-20-2017 DR GARG IN EP[facility]    Sepsis 08/2017    Syncope and collapse     PRIOR TO ICD/PACEMAKER PLACED 2014    Type 2 diabetes mellitus, controlled DX 1998    BS  AVG 100  A1C 7.4 Dec 27 2017    [facility]     PT SEE HIS PMD April 01 2018 RE THIS-TOLD TO SEE PMD BY [facility] JUNE 12 WHEN THEY SAW HIM FOR A CL       Past Surgical History:  Past Surgical History:   Procedure Laterality Date    BIV  11/10/2016    ICD METRONIC  UPGRADE     CARDIAC CATHETERIZATION  03/2010    CORONARY STENT PLACEMENT X 2 PER PT; LM normal, LAD 70-80% distal lesion at apex, 20% lesion mid CFX    CARDIAC CATHETERIZATION  12/2012    CARDIAC DEFIBRILLATOR PLACEMENT  12/2012    [facility]    CARDIAC PACEMAKER PLACEMENT  2014    [employer] ICD/PACEMAKER PLACED [facility]    CIRCUMCISION  AGE 33    COLONOSCOPY  2009    CORRECTION HAMMER TOE      duodenal ulcer  1973    STRESS RELATED IN SCHOOL    ECHOCARDIOGRAM, TRANSTHORACIC  02/2016 11/2016 09/2017 12/2017    ECHOCARDIOGRAM, TRANSTHORACIC  02/2016,11/2016,12/20/2017    EGD N/A 08/15/2014    Procedure: EGD;  Surgeon: Colon Branch, MD;  Location: [facility];  Service: Gastroenterology;  Laterality: N/A;  egd w/ bx    EGD  1975    EGD, COLONOSCOPY N/A 04/04/2018    Procedure: EGD with bxs, COLONOSCOPY with polypectomy and clipping;  Surgeon: Doyne Keel, MD;  Location: [facility];  Service: Gastroenterology;  Laterality: N/A;    FRACTURE SURGERY  AGE 44     RT  ANKLE- CAST APPLIED    HERNIA REPAIR  AGE 33    LEFT INGUINAL HERNIA    MPI nuclear study  06/2016    orthopedic surgery  AGE 53    RT foot corrective - HAMMERTOE    TONSILLECTOMY AND ADENOIDECTOMY  1954       Family History:  Family History   Problem Relation Age of Onset    Breast cancer Mother     Heart attack Mother 53    Hypertension  Mother     Diabetes Mother     Coronary artery disease Mother     Diabetes Brother     Heart attack Father     Hypertension Father        Social History:  Social History     Socioeconomic History    Marital status: Married     Spouse name: Teacher, music    Number of children: 0   Occupational History    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, [place]    Tobacco Use    Smoking status: Never    Smokeless tobacco: Never   Vaping Use    Vaping Use: Never used   Substance and Sexual Activity    Alcohol use: No     Alcohol/week: 0.0 standard drinks     Comment: occasionally    Drug use: No    Sexual activity: Yes     Partners: Female  The following sections were reviewed this encounter by the provider:   Tobacco  Allergies  Meds  Problems  Med Hx  Surg Hx  Fam Hx         Vitals:  BP 106/72 (BP Site: Left arm, Patient Position: Sitting, Cuff Size: Large)   Pulse 85   Temp 97.2 F (36.2 C) (Temporal)   Resp 22   Wt 100.9 kg (222 lb 6.4 oz)   SpO2 97%   BMI 30.16 kg/m     ROS:  Constitutional: Negative for fever, chills and malaise/fatigue.   Respiratory: Negative for cough, shortness of breath and wheezing.    Cardiovascular: Negative for chest pain and palpitations.   Gastrointestinal: Negative for abdominal pain, diarrhea and constipation.   Musculoskeletal: Negative for joint pain.   Skin: Negative for rash.   Neurological: Negative for dizziness and headaches.   Psychiatric/Behavioral: The patient does not have insomnia.         Objective:     Physical Exam:  Constitutional: Patient is oriented to person, place, and time. Appears well-developed and well-nourished.   HENT:   Head: Normocephalic and atraumatic.   Eyes: Conjunctivae and EOM are normal. Pupils are equal, round, and reactive to light.   Cardiovascular: Normal rate, regular rhythm and normal heart sounds.  Exam reveals no gallop and no friction rub.    No murmur heard.  Pulmonary/Chest: Effort normal and breath sounds normal. Has no wheezes.    Musculoskeletal: Normal range of motion. Exhibits no edema or tenderness.   Neurological: Patient is alert and oriented to person, place, and time.   Psychiatric: Patient has a normal mood and affect.        Assessment/Plan:       1. Essential hypertension  - CBC and differential  - Comprehensive metabolic panel  Patient symptoms are well controlled under current treatment.  Recommend to continue medication as prescribed.    2. Type 2 diabetes mellitus with complication, with long-term current use of insulin  - Hemoglobin A1C  - Urine Microalbumin Random  Patient symptoms are well controlled under current treatment.  Recommend to continue medication as prescribed.    3. Acute cough  - hydrocodone-chlorpheniramine (TUSSIONEX) 10-8 MG/5ML extended-release suspension; Take 5 mLs by mouth 2 (two) times daily  Dispense: 115 mL; Refill: 0        Zorita Pang, MD

## 2021-11-25 ENCOUNTER — Telehealth (INDEPENDENT_AMBULATORY_CARE_PROVIDER_SITE_OTHER): Payer: Self-pay | Admitting: Family Medicine

## 2021-11-25 ENCOUNTER — Other Ambulatory Visit (INDEPENDENT_AMBULATORY_CARE_PROVIDER_SITE_OTHER): Payer: Self-pay | Admitting: Family Medicine

## 2021-11-25 DIAGNOSIS — D72829 Elevated white blood cell count, unspecified: Secondary | ICD-10-CM

## 2021-11-25 LAB — CELL MORPHOLOGY
Cell Morphology: ABNORMAL — AB
Platelet Estimate: DECREASED — AB

## 2021-11-25 LAB — MAN DIFF ONLY
Atypical Lymphocytes %: 12 %
Atypical Lymphocytes Absolute: 6.51 10*3/uL — ABNORMAL HIGH (ref 0.00–0.00)
Band Neutrophils Absolute: 0 10*3/uL (ref 0.00–1.00)
Band Neutrophils: 0 %
Basophils Absolute Manual: 0 10*3/uL (ref 0.00–0.08)
Basophils Manual: 0 %
Eosinophils Absolute Manual: 0 10*3/uL (ref 0.00–0.44)
Eosinophils Manual: 0 %
Lymphocytes Absolute Manual: 43.91 10*3/uL — ABNORMAL HIGH (ref 0.42–3.22)
Lymphocytes Manual: 81 %
Monocytes Absolute: 1.63 10*3/uL — ABNORMAL HIGH (ref 0.21–0.85)
Monocytes Manual: 3 %
Neutrophils Absolute Manual: 2.17 10*3/uL (ref 1.10–6.33)
Segmented Neutrophils: 4 %

## 2021-11-25 LAB — CBC AND DIFFERENTIAL
Absolute NRBC: 0.03 10*3/uL — ABNORMAL HIGH (ref 0.00–0.00)
Hematocrit: 39.4 % (ref 37.6–49.6)
Hgb: 12.3 g/dL — ABNORMAL LOW (ref 12.5–17.1)
Instrument Absolute Neutrophil Count: 3.05 10*3/uL (ref 1.10–6.33)
MCH: 33.7 pg — ABNORMAL HIGH (ref 25.1–33.5)
MCHC: 31.2 g/dL — ABNORMAL LOW (ref 31.5–35.8)
MCV: 107.9 fL — ABNORMAL HIGH (ref 78.0–96.0)
MPV: 12.3 fL (ref 8.9–12.5)
Nucleated RBC: 0.1 /100 WBC — ABNORMAL HIGH (ref 0.0–0.0)
Platelets: 142 10*3/uL (ref 142–346)
RBC: 3.65 10*6/uL — ABNORMAL LOW (ref 4.20–5.90)
RDW: 15 % (ref 11–15)
WBC: 54.21 10*3/uL (ref 3.10–9.50)

## 2021-11-25 LAB — HEMOGLOBIN A1C
Average Estimated Glucose: 99.7 mg/dL
Hemoglobin A1C: 5.1 % (ref 4.6–5.9)

## 2021-11-25 LAB — CBC PATHOLOGIST REVIEW

## 2021-11-25 NOTE — Telephone Encounter (Signed)
On-call note:  Got a call about a critical lab WBC 50,000.  Contacted patient multiple times no answer.  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  Informed to follow-up in the morning with his oncologist and PCP as his white blood cell count was extremely high-patient wife voiced understanding.  She is not with him now, she is in [place] visiting her mother but will inform patient in the morning.

## 2021-11-28 ENCOUNTER — Telehealth (INDEPENDENT_AMBULATORY_CARE_PROVIDER_SITE_OTHER): Payer: Self-pay | Admitting: Family Medicine

## 2021-11-28 NOTE — Telephone Encounter (Signed)
Called pt to request to repeat his CBC as per the providers recommendation. LVM in detail.

## 2021-12-01 ENCOUNTER — Telehealth (INDEPENDENT_AMBULATORY_CARE_PROVIDER_SITE_OTHER): Payer: Self-pay | Admitting: Family Medicine

## 2021-12-01 DIAGNOSIS — E118 Type 2 diabetes mellitus with unspecified complications: Secondary | ICD-10-CM

## 2021-12-01 NOTE — Telephone Encounter (Signed)
The pharmacy is requesting for pt's BD NANo tip of pen needles ( 4 mm 32 G) pls clarify        Pls send Rx to     [id number] [place], [place] - [address] AT [facility] [address] [facility] Phone:  [phone]   Fax:  [phone]        Thank you

## 2021-12-02 MED ORDER — BD PEN NEEDLE NANO U/F 32G X 4 MM MISC
3 refills | Status: DC
Start: 2021-12-02 — End: 2022-09-09

## 2021-12-09 NOTE — Telephone Encounter (Signed)
Patient called; he booked appt to repeat his CBC. Patient is requesting to see PCP to discuss results. He states PCP has asked him to come in person. Unfortunately, there is no open availability.           Please advise, thanks

## 2021-12-09 NOTE — Telephone Encounter (Signed)
Pt was able to schedule an appointment with PCP on 2/27 at 3 pm.

## 2021-12-10 ENCOUNTER — Other Ambulatory Visit (FREE_STANDING_LABORATORY_FACILITY): Payer: Commercial Managed Care - POS

## 2021-12-10 DIAGNOSIS — D72829 Elevated white blood cell count, unspecified: Secondary | ICD-10-CM

## 2021-12-10 LAB — CBC AND DIFFERENTIAL
Absolute NRBC: 0 10*3/uL (ref 0.00–0.00)
Basophils Absolute Automated: 0.05 10*3/uL (ref 0.00–0.08)
Basophils Automated: 0.9 %
Eosinophils Absolute Automated: 0.18 10*3/uL (ref 0.00–0.44)
Eosinophils Automated: 3.1 %
Hematocrit: 35 % — ABNORMAL LOW (ref 37.6–49.6)
Hgb: 12.4 g/dL — ABNORMAL LOW (ref 12.5–17.1)
Immature Granulocytes Absolute: 0.02 10*3/uL (ref 0.00–0.07)
Immature Granulocytes: 0.3 %
Instrument Absolute Neutrophil Count: 2.55 10*3/uL (ref 1.10–6.33)
Lymphocytes Absolute Automated: 2.4 10*3/uL (ref 0.42–3.22)
Lymphocytes Automated: 41.8 %
MCH: 34.7 pg — ABNORMAL HIGH (ref 25.1–33.5)
MCHC: 35.4 g/dL (ref 31.5–35.8)
MCV: 98 fL — ABNORMAL HIGH (ref 78.0–96.0)
MPV: 10.5 fL (ref 8.9–12.5)
Monocytes Absolute Automated: 0.54 10*3/uL (ref 0.21–0.85)
Monocytes: 9.4 %
Neutrophils Absolute: 2.55 10*3/uL (ref 1.10–6.33)
Neutrophils: 44.5 %
Nucleated RBC: 0 /100 WBC (ref 0.0–0.0)
Platelets: 311 10*3/uL (ref 142–346)
RBC: 3.57 10*6/uL — ABNORMAL LOW (ref 4.20–5.90)
RDW: 15 % (ref 11–15)
WBC: 5.74 10*3/uL (ref 3.10–9.50)

## 2021-12-15 ENCOUNTER — Encounter (INDEPENDENT_AMBULATORY_CARE_PROVIDER_SITE_OTHER): Payer: Self-pay | Admitting: Family Medicine

## 2021-12-15 ENCOUNTER — Ambulatory Visit (INDEPENDENT_AMBULATORY_CARE_PROVIDER_SITE_OTHER): Payer: Commercial Managed Care - POS | Admitting: Family Medicine

## 2021-12-15 VITALS — BP 108/66 | HR 79 | Temp 98.0°F | Wt 218.8 lb

## 2021-12-15 DIAGNOSIS — N183 Chronic kidney disease, stage 3 unspecified: Secondary | ICD-10-CM

## 2021-12-15 DIAGNOSIS — Z794 Long term (current) use of insulin: Secondary | ICD-10-CM

## 2021-12-15 DIAGNOSIS — F339 Major depressive disorder, recurrent, unspecified: Secondary | ICD-10-CM

## 2021-12-15 DIAGNOSIS — C9110 Chronic lymphocytic leukemia of B-cell type not having achieved remission: Secondary | ICD-10-CM

## 2021-12-15 DIAGNOSIS — J42 Unspecified chronic bronchitis: Secondary | ICD-10-CM

## 2021-12-15 DIAGNOSIS — Z23 Encounter for immunization: Secondary | ICD-10-CM

## 2021-12-15 DIAGNOSIS — I10 Essential (primary) hypertension: Secondary | ICD-10-CM

## 2021-12-15 DIAGNOSIS — E118 Type 2 diabetes mellitus with unspecified complications: Secondary | ICD-10-CM

## 2021-12-15 MED ORDER — SEMGLEE (YFGN) 100 UNIT/ML SC SOPN
50.0000 [IU] | PEN_INJECTOR | Freq: Every day | SUBCUTANEOUS | 1 refills | Status: DC | PRN
Start: 2021-12-15 — End: 2022-02-07

## 2021-12-15 NOTE — Progress Notes (Signed)
Subjective:      Date: 12/15/2021 3:49 PM   Patient ID: [name] [name] is a 76 y.o. male.    Chief Complaint:  Chief Complaint   Patient presents with    Lab reviews       HPI:  HPI    HTN: Hypertension is well controlled at this point.  Patient is checking his blood pressure regularly at least 3-4 times a week.  Blood pressure at home is always in normal range.  Patient avoids adding salt and is doing regular cardiac exercise.  Average 118/80    DM : Patient is compliant with current medication.  Tries to exercise at least 3 times a week.  Watches type of diet and amount of carbohydrate intake.  Check lab work for diabetes regularly in our clinic.  His average glucose level is currently below 100 so I recommend to reduce the insulin from 50 mg to 30 mg and continue checking the glucose levels regularly follow-up with me in a months to discuss if we can go even lower with the insulin considering that his hemoglobin A1c is 5.1.  I am more concerned about the levels are too low then too high.    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  With his last blood draws the numbers came back down to the normal range.    I discussed with the patient all his lab results.  And they are basically all excellent.    Appointment on 12/10/2021   Component Date Value Ref Range Status    WBC 12/10/2021 5.74  3.10 - 9.50 x10 3/uL Final    Hgb 12/10/2021 12.4 (L)  12.5 - 17.1 g/dL Final    Hematocrit 16/07/9603 35.0 (L)  37.6 - 49.6 % Final    Platelets 12/10/2021 311  142 - 346 x10 3/uL Final    RBC 12/10/2021 3.57 (L)  4.20 - 5.90 x10 6/uL Final    MCV 12/10/2021 98.0 (H)  78.0 - 96.0 fL Final    MCH 12/10/2021 34.7 (H)  25.1 - 33.5 pg Final    MCHC 12/10/2021 35.4  31.5 - 35.8 g/dL Final    RDW 54/06/8118 15  11 - 15 % Final    MPV 12/10/2021 10.5  8.9 - 12.5 fL Final    Instrument Absolute Neutrophil Cou* 12/10/2021 2.55  1.10 - 6.33 x10 3/uL  Final    Comment: The IANC is a preliminary result.  Final absolute neutrophil count may differ.      Neutrophils 12/10/2021 44.5  None % Final    Lymphocytes Automated 12/10/2021 41.8  None % Final    Monocytes 12/10/2021 9.4  None % Final    Eosinophils Automated 12/10/2021 3.1  None % Final    Basophils Automated 12/10/2021 0.9  None % Final    Immature Granulocytes 12/10/2021 0.3  None % Final    Nucleated RBC 12/10/2021 0.0  0.0 - 0.0 /100 WBC Final    Neutrophils Absolute 12/10/2021 2.55  1.10 - 6.33 x10 3/uL Final    Lymphocytes Absolute Automated 12/10/2021 2.40  0.42 - 3.22 x10 3/uL Final    Monocytes Absolute Automated 12/10/2021 0.54  0.21 - 0.85 x10 3/uL Final    Eosinophils Absolute Automated 12/10/2021 0.18  0.00 - 0.44 x10 3/uL Final    Basophils Absolute Automated 12/10/2021 0.05  0.00 - 0.08 x10 3/uL Final    Immature  Granulocytes Absolute 12/10/2021 0.02  0.00 - 0.07 x10 3/uL Final    Absolute NRBC 12/10/2021 0.00  0.00 - 0.00 x10 3/uL Final                  Problem List:  Patient Active Problem List   Diagnosis    Chest pain    Type 2 diabetes mellitus with complication, with long-term current use of insulin    Heart attack    CAD (coronary artery disease)    Non-ischemic cardiomyopathy    Acute exacerbation of CHF (congestive heart failure)    PUD (peptic ulcer disease)    Elevated LFTs    Acute dyspnea    Elevated d-dimer    CHF (congestive heart failure)    Headaches due to old head injury    Essential hypertension    Pneumonia due to infectious organism, unspecified laterality, unspecified part of lung    Syncope and collapse    AICD (automatic cardioverter/defibrillator) present    Syncope, unspecified syncope type    Pneumonia due to infectious organism    Dyspnea, unspecified type    Cardiomyopathy    Cough    Sepsis    Sepsis, due to unspecified organism    SOB (shortness of breath)    History of colon polyps    GERD (gastroesophageal reflux disease)    Hypertensive heart disease without  heart failure    Atherosclerotic heart disease of native coronary artery without angina pectoris    Heart failure, unspecified    Nonischemic cardiomyopathy    BO (blackout)    Belching    Acute subendocardial infarction, subsequent episode of care    Difficulty sleeping    Dizziness    Encounter for examination for normal comparison and control in clinical research program    Fatigue    Nasal congestion    Paroxysmal ventricular tachycardia    Pure hypercholesterolemia    Tachycardia, unspecified    Chronic systolic congestive heart failure    Encounter for implantable defibrillator reprogramming or check    Episode of recurrent major depressive disorder, unspecified depression episode severity    Stage 3 chronic kidney disease, 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    Anxiety and depression    COPD (chronic obstructive pulmonary disease)    HLD (hyperlipidemia)    Primary central sleep apnea    Sleep apnea, obstructive    Cheyne-Stokes respiration       Current Medications:  Outpatient Medications Marked as Taking for the 12/15/21 encounter (Office Visit) with Zorita Pang, MD   Medication Sig Dispense Refill    aspirin EC 81 MG EC tablet Take 81 mg by mouth daily      atorvastatin (LIPITOR) 40 MG tablet Take 1 tablet (40 mg total) by mouth nightly 90 tablet 0    carvedilol (COREG) 25 MG tablet Take 1 tablet (25 mg total) by mouth 2 (two) times daily with meals 180 tablet 3    empagliflozin (JARDIANCE) 10 MG tablet Take 1 tablet (10 mg total) by mouth daily 90 tablet 3    fluticasone furoate-vilanterol (Breo Ellipta) 100-25 MCG/INH Aerosol Pwdr, Breath Activated Inhale 1 puff into the lungs daily 3 each 3    glucose blood (ONE TOUCH ULTRA TEST) test strip 1 each by Other route 2 (two) times daily Use as instructed 300 each 1    hydrocodone-chlorpheniramine (TUSSIONEX) 10-8 MG/5ML extended-release suspension Take 5 mLs by mouth 2 (two) times daily 115 mL 0  insulin glargine (LANTUS SOLOSTAR) 100 UNIT/ML  injection pen Patient takes 50 units at night 45 mL 3    Insulin Pen Needle (BD Pen Needle Nano U/F) 32G X 4 MM Misc Use as directed bid 200 each 3    metOLazone (ZAROXOLYN) 2.5 MG tablet Take 1 tablet (2.5 mg total) by mouth once a week 12 tablet 3    niacin (NIASPAN) 500 MG CR tablet TAKE 1 TABLET BY MOUTH TWICE DAILY 180 tablet 3    PARoxetine (Paxil) 20 MG tablet Take 1 tablet (20 mg total) by mouth every morning 30 tablet 11    sacubitril-valsartan (Entresto) 97-103 MG Tab per tablet Take 1 tablet by mouth 2 (two) times daily 180 tablet 3    SITagliptin-metFORMIN (Janumet) 50-1000 MG tablet Take 1 tablet by mouth 2 (two) times daily with meals 180 tablet 3    spironolactone (ALDACTONE) 25 MG tablet Take 0.5 tablets (12.5 mg total) by mouth daily 45 tablet 3    torsemide (Demadex) 20 MG tablet Take 2 tablets (40 mg) by mouth every morning AND 1 tablet (20 mg) every evening. 270 tablet 3    [DISCONTINUED] Semglee, yfgn, 100 UNIT/ML Solution Pen-injector ADMINISTER 50 UNITS UNDER THE SKIN AT BEDTIME         Allergies:  No Known Allergies    Past Medical History:  Past Medical History:   Diagnosis Date    Acute CHF     NOV  & DEC 2018, - DIALYSIS CATH PLACED Aug 24 2017 TO REMOVE FLUID     Acute systolic (congestive) heart failure 08/2017    Anxiety     CAD (coronary artery disease)     Cardiomyopathy     nonischemic    CHF (congestive heart failure) 08/12/2014, 2013    Chronic obstructive pulmonary disease     POSSIBLE PER PT HE USES  SYNBICORT BID ABD  BREO INHALER PRN    Coronary artery disease 2011    CORONARY STENT PLACEMENT FOLLOWED BY [facility]    Depression     echocardiogram 02/2016, 11/2016, 09/2017, 12/20/2017    GERD (gastroesophageal reflux disease)     Heart attack 2011    and 2014    Hyperlipemia     Hypertension     ICD (implantable 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     PLACED 2014  [facility]  LAST INTERROGATION April 01 2018 REPORT REQUESTED    Ischemic cardiomyopathy     EF 15% ON ECHO 09-20-2017 DR  GARG IN EPIC    Nonischemic cardiomyopathy     NSTEMI (non-ST elevated myocardial infarction)     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    Pacemaker 2014    [employer] ICD/PACEMAKER COMBO LAST INTERROGATION  12-12 2018    Pneumonia 09/2016    Primary cardiomyopathy     Ischemic cardiomyopathy EF 15% ON ECHO 09-20-2017 DR GARG IN EPIC    Sepsis 08/2017    Syncope and collapse     PRIOR TO ICD/PACEMAKER PLACED 2014    Type 2 diabetes mellitus, controlled DX 1998    BS  AVG 100  A1C 7.4 Dec 27 2017    [facility]     PT SEE HIS PMD April 01 2018 RE THIS-TOLD TO SEE PMD BY [facility] JUNE 12 WHEN THEY SAW HIM FOR A CL       Past Surgical History:  Past Surgical History:   Procedure Laterality Date    BIV  11/10/2016    ICD METRONIC  UPGRADE  CARDIAC CATHETERIZATION  03/2010    CORONARY STENT PLACEMENT X 2 PER PT; LM normal, LAD 70-80% distal lesion at apex, 20% lesion mid CFX    CARDIAC CATHETERIZATION  12/2012    CARDIAC DEFIBRILLATOR PLACEMENT  12/2012    [facility]    CARDIAC PACEMAKER PLACEMENT  2014    [employer] ICD/PACEMAKER PLACED [facility]    CIRCUMCISION  AGE 12    COLONOSCOPY  2009    CORRECTION HAMMER TOE      duodenal ulcer  1973    STRESS RELATED IN SCHOOL    ECHOCARDIOGRAM, TRANSTHORACIC  02/2016 11/2016 09/2017 12/2017    ECHOCARDIOGRAM, TRANSTHORACIC  02/2016,11/2016,12/20/2017    EGD N/A 08/15/2014    Procedure: EGD;  Surgeon: Colon Branch, MD;  Location: [facility];  Service: Gastroenterology;  Laterality: N/A;  egd w/ bx    EGD  1975    EGD, COLONOSCOPY N/A 04/04/2018    Procedure: EGD with bxs, COLONOSCOPY with polypectomy and clipping;  Surgeon: Doyne Keel, MD;  Location: [facility];  Service: Gastroenterology;  Laterality: N/A;    FRACTURE SURGERY  AGE 89     RT  ANKLE- CAST APPLIED    HERNIA REPAIR  AGE 12    LEFT INGUINAL HERNIA    MPI nuclear study  06/2016    orthopedic surgery  AGE 68    RT foot corrective - [facility]    TONSILLECTOMY AND ADENOIDECTOMY  1954       Family History:  Family History    Problem Relation Age of Onset    Breast cancer Mother     Heart attack Mother 10    Hypertension Mother     Diabetes Mother     Coronary artery disease Mother     Diabetes Brother     Heart attack Father     Hypertension Father        Social History:  Social History     Socioeconomic History    Marital status: Married     Spouse name: Teacher, music    Number of children: 0   Occupational History    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, [place]    Tobacco Use    Smoking status: Never    Smokeless tobacco: Never   Vaping Use    Vaping Use: Never used   Substance and Sexual Activity    Alcohol use: No     Alcohol/week: 0.0 standard drinks     Comment: occasionally    Drug use: No    Sexual activity: Yes     Partners: Female        The following sections were reviewed this encounter by the provider:   Tobacco  Allergies  Meds  Problems  Med Hx  Surg Hx  Fam Hx         Vitals:  BP 108/66 (BP Site: Right arm, Patient Position: Sitting, Cuff Size: Large)   Pulse 79   Temp 98 F (36.7 C) (Temporal)   Wt 99.2 kg (218 lb 12.8 oz)   SpO2 97%   BMI 29.67 kg/m     ROS:  Constitutional: Negative for fever, chills and malaise/fatigue.   Respiratory: Negative for cough, shortness of breath and wheezing.    Cardiovascular: Negative for chest pain and palpitations.   Gastrointestinal: Negative for abdominal pain, diarrhea and constipation.   Musculoskeletal: Negative for joint pain.   Skin: Negative for rash.   Neurological: Negative for dizziness and headaches.   Psychiatric/Behavioral: The  patient does not have insomnia.         Objective:     Physical Exam:  Constitutional: Patient is oriented to person, place, and time. Appears well-developed and well-nourished.   HENT:   Head: Normocephalic and atraumatic.   Eyes: Conjunctivae and EOM are normal. Pupils are equal, round, and reactive to light.   Cardiovascular: Normal rate, regular rhythm and normal heart sounds.  Exam reveals no gallop and no friction rub.    No murmur  heard.  Pulmonary/Chest: Effort normal and breath sounds normal. Has no wheezes.   Musculoskeletal: Normal range of motion. Exhibits no edema or tenderness.   Neurological: Patient is alert and oriented to person, place, and time.   Psychiatric: Patient has a normal mood and affect.        Assessment/Plan:       1. Need for vaccination  - Flu Vaccine High Dose, 65 yrs    2. Chronic bronchitis, unspecified chronic bronchitis type  Currently well under control.    3. Episode of recurrent major depressive disorder, unspecified depression episode severity  Well controlled.    4. 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    5. Type 2 diabetes mellitus with complication, with long-term current use of insulin  - Semglee, yfgn, 100 UNIT/ML Solution Pen-injector; Inject 50 Units into the skin daily as needed (glucose level)  Dispense: 90 mL; Refill: 1  Refill it for 50 units a day.  But at this point I want him to reduce the dose to 30 units a day and check blood sugar every day and see if the blood sugar still stays in the normal range.      6. Hypertension, unspecified type  Patient symptoms are well controlled under current treatment.  Recommend to continue medication as prescribed.    7. CLL (chronic lymphocytic leukemia)  Currently well controlled.    Time spent with patient and coordinating care over 25 minutes.    Zorita Pang, MD

## 2021-12-15 NOTE — Progress Notes (Signed)
Have you seen any specialists since your last visit with us?  No      The patient was informed that the following HM items are still outstanding:   influenza vaccine

## 2022-01-08 ENCOUNTER — Telehealth (INDEPENDENT_AMBULATORY_CARE_PROVIDER_SITE_OTHER): Payer: Self-pay | Admitting: Nurse Practitioner

## 2022-01-08 ENCOUNTER — Encounter (INDEPENDENT_AMBULATORY_CARE_PROVIDER_SITE_OTHER): Payer: Self-pay | Admitting: Specialist

## 2022-01-08 ENCOUNTER — Ambulatory Visit (INDEPENDENT_AMBULATORY_CARE_PROVIDER_SITE_OTHER): Payer: Commercial Managed Care - POS | Admitting: Specialist

## 2022-01-08 ENCOUNTER — Encounter (INDEPENDENT_AMBULATORY_CARE_PROVIDER_SITE_OTHER): Payer: Self-pay

## 2022-01-08 VITALS — BP 111/73 | HR 79 | Temp 97.4°F | Wt 206.3 lb

## 2022-01-08 DIAGNOSIS — Z794 Long term (current) use of insulin: Secondary | ICD-10-CM

## 2022-01-08 DIAGNOSIS — R634 Abnormal weight loss: Secondary | ICD-10-CM

## 2022-01-08 DIAGNOSIS — E118 Type 2 diabetes mellitus with unspecified complications: Secondary | ICD-10-CM

## 2022-01-08 DIAGNOSIS — R197 Diarrhea, unspecified: Secondary | ICD-10-CM

## 2022-01-08 DIAGNOSIS — I10 Essential (primary) hypertension: Secondary | ICD-10-CM

## 2022-01-08 LAB — CBC AND DIFFERENTIAL
Absolute NRBC: 0 10*3/uL (ref 0.00–0.00)
Hematocrit: 47.9 % (ref 37.6–49.6)
Hgb: 15.1 g/dL (ref 12.5–17.1)
Instrument Absolute Neutrophil Count: 2.5 10*3/uL (ref 1.10–6.33)
MCH: 33.6 pg — ABNORMAL HIGH (ref 25.1–33.5)
MCHC: 31.5 g/dL (ref 31.5–35.8)
MCV: 106.7 fL — ABNORMAL HIGH (ref 78.0–96.0)
MPV: 12.3 fL (ref 8.9–12.5)
Nucleated RBC: 0 /100 WBC (ref 0.0–0.0)
Platelets: 134 10*3/uL — ABNORMAL LOW (ref 142–346)
RBC: 4.49 10*6/uL (ref 4.20–5.90)
RDW: 15 % (ref 11–15)
WBC: 42.49 10*3/uL (ref 3.10–9.50)

## 2022-01-08 LAB — TSH: TSH: 0.78 u[IU]/mL (ref 0.35–4.94)

## 2022-01-08 LAB — MAN DIFF ONLY
Atypical Lymphocytes %: 5 %
Atypical Lymphocytes Absolute: 2.12 10*3/uL — ABNORMAL HIGH (ref 0.00–0.00)
Band Neutrophils Absolute: 0 10*3/uL (ref 0.00–1.00)
Band Neutrophils: 0 %
Basophils Absolute Manual: 0 10*3/uL (ref 0.00–0.08)
Basophils Manual: 0 %
Eosinophils Absolute Manual: 0 10*3/uL (ref 0.00–0.44)
Eosinophils Manual: 0 %
Lymphocytes Absolute Manual: 35.27 10*3/uL — ABNORMAL HIGH (ref 0.42–3.22)
Lymphocytes Manual: 83 %
Monocytes Absolute: 2.12 10*3/uL — ABNORMAL HIGH (ref 0.21–0.85)
Monocytes Manual: 5 %
Neutrophils Absolute Manual: 2.97 10*3/uL (ref 1.10–6.33)
Segmented Neutrophils: 7 %

## 2022-01-08 LAB — COMPREHENSIVE METABOLIC PANEL
ALT: 13 U/L (ref 0–55)
AST (SGOT): 15 U/L (ref 5–41)
Albumin/Globulin Ratio: 1.4 (ref 0.9–2.2)
Albumin: 4.3 g/dL (ref 3.5–5.0)
Alkaline Phosphatase: 99 U/L (ref 37–117)
Anion Gap: 11 (ref 5.0–15.0)
BUN: 33 mg/dL — ABNORMAL HIGH (ref 9.0–28.0)
Bilirubin, Total: 0.8 mg/dL (ref 0.2–1.2)
CO2: 21 mEq/L (ref 17–29)
Calcium: 8.9 mg/dL (ref 7.9–10.2)
Chloride: 105 mEq/L (ref 99–111)
Creatinine: 1.8 mg/dL — ABNORMAL HIGH (ref 0.5–1.5)
Globulin: 3 g/dL (ref 2.0–3.6)
Glucose: 131 mg/dL — ABNORMAL HIGH (ref 70–100)
Potassium: 4.9 mEq/L (ref 3.5–5.3)
Protein, Total: 7.3 g/dL (ref 6.0–8.3)
Sodium: 137 mEq/L (ref 135–145)

## 2022-01-08 LAB — CELL MORPHOLOGY
Cell Morphology: ABNORMAL — AB
Platelet Estimate: DECREASED — AB

## 2022-01-08 LAB — HEMOLYSIS INDEX: Hemolysis Index: 21 Index (ref 0–24)

## 2022-01-08 LAB — GFR: EGFR: 44.7

## 2022-01-08 MED ORDER — JANUMET 50-500 MG PO TABS
1.0000 | ORAL_TABLET | Freq: Two times a day (BID) | ORAL | 5 refills | Status: DC
Start: 2022-01-08 — End: 2022-09-14

## 2022-01-08 NOTE — Progress Notes (Signed)
Have you seen any specialists since your last visit with us?  No      The patient was informed that the following HM items are still outstanding:   colonoscopy, eye exam, and shingles vaccine

## 2022-01-08 NOTE — Telephone Encounter (Signed)
On call contacted with critical lab of WBC of 42.49. Pt has had this in the past and is under care of hematology. Pt contacted and advised of critical lab. Recommend pt contact hematology for follow up asap. Pt agreed

## 2022-01-08 NOTE — Progress Notes (Signed)
Subjective:      Date: 01/08/2022 9:03 AM   Patient ID: [name] [name] is a 76 y.o. male.    Chief Complaint:  Chief Complaint   Patient presents with    Diarrhea     For the last seven days, and abdominal pain        HPI:    [name] [name] is a 76 y.o. pleasant male here today complaining of having    1 to 3 loose stools x 1 week  Started metolazone 4 months ago and is wondering if that could be a side effect.  Colonoscopy 6 yrs ago normal.  Lost 12 lbs in 3 weeks.  Denies having any abdominal pain or blood in the stool.    Pt has stable essential HTN with no evidence of CHF. The patient is compliant with anti-hypertensive therapy and denies side effects to therapy.  Pt denies CP, SOB, dizziness, orthopnea, PND or edema.  Pt has Type II DM.  Pt is compliant with therapy.  Denies hypoglycemia.  Prior A1C is at goal.   Lab Results   Component Value Date    HGBA1C 5.1 11/24/2021       Problem List:  Patient Active Problem List   Diagnosis    Chest pain    Type 2 diabetes mellitus with complication, with long-term current use of insulin    Heart attack    CAD (coronary artery disease)    Non-ischemic cardiomyopathy    Acute exacerbation of CHF (congestive heart failure)    PUD (peptic ulcer disease)    Elevated LFTs    Acute dyspnea    Elevated d-dimer    CHF (congestive heart failure)    Headaches due to old head injury    Essential hypertension    Pneumonia due to infectious organism, unspecified laterality, unspecified part of lung    Syncope and collapse    AICD (automatic cardioverter/defibrillator) present    Syncope, unspecified syncope type    Pneumonia due to infectious organism    Dyspnea, unspecified type    Cardiomyopathy    Cough    Sepsis    Sepsis, due to unspecified organism    SOB (shortness of breath)    History of colon polyps    GERD (gastroesophageal reflux disease)    Hypertensive heart disease without heart failure    Atherosclerotic heart disease of native coronary artery without  angina pectoris    Heart failure, unspecified    Nonischemic cardiomyopathy    BO (blackout)    Belching    Acute subendocardial infarction, subsequent episode of care    Difficulty sleeping    Dizziness    Encounter for examination for normal comparison and control in clinical research program    Fatigue    Nasal congestion    Paroxysmal ventricular tachycardia    Pure hypercholesterolemia    Tachycardia, unspecified    Chronic systolic congestive heart failure    Encounter for implantable defibrillator reprogramming or check    Episode of recurrent major depressive disorder, unspecified depression episode severity    Stage 3 chronic kidney disease, 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    Anxiety and depression    COPD (chronic obstructive pulmonary disease)    HLD (hyperlipidemia)    Primary central sleep apnea    Sleep apnea, obstructive    Cheyne-Stokes respiration       Current Medications:  Outpatient Medications Marked as Taking for the 01/08/22 encounter (Office Visit) with  Carlisle Enke, Blinda Leatherwood, MD   Medication Sig Dispense Refill    aspirin EC 81 MG EC tablet Take 81 mg by mouth daily      atorvastatin (LIPITOR) 40 MG tablet Take 1 tablet (40 mg total) by mouth nightly 90 tablet 0    carvedilol (COREG) 25 MG tablet Take 1 tablet (25 mg total) by mouth 2 (two) times daily with meals 180 tablet 3    empagliflozin (JARDIANCE) 10 MG tablet Take 1 tablet (10 mg total) by mouth daily 90 tablet 3    fluticasone furoate-vilanterol (Breo Ellipta) 100-25 MCG/INH Aerosol Pwdr, Breath Activated Inhale 1 puff into the lungs daily 3 each 3    glucose blood (ONE TOUCH ULTRA TEST) test strip 1 each by Other route 2 (two) times daily Use as instructed 300 each 1    hydrocodone-chlorpheniramine (TUSSIONEX) 10-8 MG/5ML extended-release suspension Take 5 mLs by mouth 2 (two) times daily 115 mL 0    insulin glargine (LANTUS SOLOSTAR) 100 UNIT/ML injection pen Patient takes 50 units at night 45 mL 3    Insulin Pen Needle (BD Pen  Needle Nano U/F) 32G X 4 MM Misc Use as directed bid 200 each 3    metOLazone (ZAROXOLYN) 2.5 MG tablet Take 1 tablet (2.5 mg total) by mouth once a week 12 tablet 3    niacin (NIASPAN) 500 MG CR tablet TAKE 1 TABLET BY MOUTH TWICE DAILY 180 tablet 3    PARoxetine (Paxil) 20 MG tablet Take 1 tablet (20 mg total) by mouth every morning 30 tablet 11    sacubitril-valsartan (Entresto) 97-103 MG Tab per tablet Take 1 tablet by mouth 2 (two) times daily 180 tablet 3    Semglee, yfgn, 100 UNIT/ML Solution Pen-injector Inject 50 Units into the skin daily as needed (glucose level) 90 mL 1    spironolactone (ALDACTONE) 25 MG tablet Take 0.5 tablets (12.5 mg total) by mouth daily 45 tablet 3    torsemide (Demadex) 20 MG tablet Take 2 tablets (40 mg) by mouth every morning AND 1 tablet (20 mg) every evening. 270 tablet 3    [DISCONTINUED] SITagliptin-metFORMIN (Janumet) 50-1000 MG tablet Take 1 tablet by mouth 2 (two) times daily with meals 180 tablet 3       Allergies:  No Known Allergies    Past Medical History:  Past Medical History:   Diagnosis Date    Acute CHF     NOV  & DEC 2018, - DIALYSIS CATH PLACED Aug 24 2017 TO REMOVE FLUID     Acute systolic (congestive) heart failure 08/2017    Anxiety     CAD (coronary artery disease)     Cardiomyopathy     nonischemic    CHF (congestive heart failure) 08/12/2014, 2013    Chronic obstructive pulmonary disease     POSSIBLE PER PT HE USES  SYNBICORT BID ABD  BREO INHALER PRN    Coronary artery disease 2011    CORONARY STENT PLACEMENT FOLLOWED BY [facility]    Depression     echocardiogram 02/2016, 11/2016, 09/2017, 12/20/2017    GERD (gastroesophageal reflux disease)     Heart attack 2011    and 2014    Hyperlipemia     Hypertension     ICD (implantable 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     PLACED 2014  [facility]  LAST INTERROGATION April 01 2018 REPORT REQUESTED    Ischemic cardiomyopathy     EF 15% ON ECHO 09-20-2017 DR Eben Burow IN EP[facility]  Nonischemic cardiomyopathy     NSTEMI (non-ST  elevated myocardial infarction)     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    Pacemaker 2014    [employer] ICD/PACEMAKER COMBO LAST INTERROGATION  12-12 2018    Pneumonia 09/2016    Primary cardiomyopathy     Ischemic cardiomyopathy EF 15% ON ECHO 09-20-2017 DR GARG IN EPIC    Sepsis 08/2017    Syncope and collapse     PRIOR TO ICD/PACEMAKER PLACED 2014    Type 2 diabetes mellitus, controlled DX 1998    BS  AVG 100  A1C 7.4 Dec 27 2017    [place]     PT SEE HIS PMD April 01 2018 RE THIS-TOLD TO SEE PMD BY [facility] JUNE 12 WHEN THEY SAW HIM FOR A CL       Past Surgical History:  Past Surgical History:   Procedure Laterality Date    BIV  11/10/2016    ICD METRONIC  UPGRADE     CARDIAC CATHETERIZATION  03/2010    CORONARY STENT PLACEMENT X 2 PER PT; LM normal, LAD 70-80% distal lesion at apex, 20% lesion mid CFX    CARDIAC CATHETERIZATION  12/2012    CARDIAC DEFIBRILLATOR PLACEMENT  12/2012    [facility]    CARDIAC PACEMAKER PLACEMENT  2014    [employer] ICD/PACEMAKER PLACED [facility]    CIRCUMCISION  AGE 70    COLONOSCOPY  2009    CORRECTION HAMMER TOE      duodenal ulcer  1973    STRESS RELATED IN SCHOOL    ECHOCARDIOGRAM, TRANSTHORACIC  02/2016 11/2016 09/2017 12/2017    ECHOCARDIOGRAM, TRANSTHORACIC  02/2016,11/2016,12/20/2017    EGD N/A 08/15/2014    Procedure: EGD;  Surgeon: Colon Branch, MD;  Location: [facility];  Service: Gastroenterology;  Laterality: N/A;  egd w/ bx    EGD  1975    EGD, COLONOSCOPY N/A 04/04/2018    Procedure: EGD with bxs, COLONOSCOPY with polypectomy and clipping;  Surgeon: Doyne Keel, MD;  Location: [facility];  Service: Gastroenterology;  Laterality: N/A;    FRACTURE SURGERY  AGE 74     RT  ANKLE- CAST APPLIED    HERNIA REPAIR  AGE 70    LEFT INGUINAL HERNIA    MPI nuclear study  06/2016    orthopedic surgery  AGE 53    RT foot corrective - HAMMERTOE    TONSILLECTOMY AND ADENOIDECTOMY  1954       Family History:  Family History   Problem Relation Age of Onset    Breast cancer Mother      Heart attack Mother 60    Hypertension Mother     Diabetes Mother     Coronary artery disease Mother     Diabetes Brother     Heart attack Father     Hypertension Father        Social History:  Social History     Tobacco Use    Smoking status: Never    Smokeless tobacco: Never   Vaping Use    Vaping status: Never Used   Substance Use Topics    Alcohol use: No     Alcohol/week: 0.0 standard drinks of alcohol     Comment: occasionally    Drug use: No          The following sections were reviewed this encounter by the provider:   Tobacco  Allergies  Meds  Problems  Med Hx  Surg Hx  Fam Hx         ROS:      Review of Systems       As above in HPI.  All other systems reviewed and are negative.    Objective:     Vitals:  BP 111/73 (BP Site: Left arm, Patient Position: Sitting, Cuff Size: Large)   Pulse 79   Temp 97.4 F (36.3 C) (Temporal)   Wt 93.6 kg (206 lb 4.8 oz)   SpO2 96%   BMI 27.98 kg/m     Physical Exam:  Physical Exam  Vitals reviewed.   Constitutional:       Appearance: Normal appearance.   HENT:      Head: Normocephalic and atraumatic.      Mouth/Throat:      Mouth: Mucous membranes are moist.   Eyes:      General: No scleral icterus.     Extraocular Movements: Extraocular movements intact.      Conjunctiva/sclera: Conjunctivae normal.   Cardiovascular:      Rate and Rhythm: Normal rate and regular rhythm.      Heart sounds: Normal heart sounds. No murmur heard.     No friction rub. No gallop.   Pulmonary:      Effort: Pulmonary effort is normal. No respiratory distress.      Breath sounds: Normal breath sounds. No wheezing, rhonchi or rales.   Abdominal:      General: There is no distension.      Palpations: There is no mass.      Tenderness: There is no abdominal tenderness. There is no right CVA tenderness, left CVA tenderness, guarding or rebound.      Hernia: No hernia is present.   Musculoskeletal:         General: Normal range of motion.      Cervical back: Normal range of motion and neck  supple.      Right lower leg: No edema.      Left lower leg: No edema.   Skin:     Coloration: Skin is not jaundiced.      Findings: No rash.   Neurological:      Mental Status: He is alert and oriented to person, place, and time. Mental status is at baseline.   Psychiatric:         Mood and Affect: Mood normal.         Assessment/Plan:       1. Diarrhea, unspecified type  - Stool Cryptosporidium / Giardia Antigen; Future  - Culture, Stool, SALM/SHIGELLA/CAMPY/EHEC; Future  - Clostridium difficile toxin B PCR; Future  - Stool Occult Blood, Immunoassay, (Not Guaiac Based); Future  - CBC and differential  - Comprehensive metabolic panel  - Ambulatory referral to Gastroenterology    2. Weight loss  - CBC and differential  - Comprehensive metabolic panel  - TSH  - Ambulatory referral to Gastroenterology    3. Essential hypertension    Current blood pressure is adequately controlled on the current medication regimen. Continue to optimize low salt diet and aerobic exercise efforts. and Continue current medication regimen.      4. Type 2 diabetes mellitus with complication, with long-term current use of insulin    Discussed that he may not need the current dose of insulin.  Patient is going to discuss this with his PCP during his upcoming annual physical.      Return in about 2 weeks (around 01/22/2022).    Legrand Rams, MD  Part of this note was generated by the Dragon speech recognition system and may contain inherent errors or omissions not intended by the user.  Grammatical errors, random word insertions, substitutions, deletions and incomplete sentences are occasional consequences of this technology due to software limitations.  Not all errors are caught or corrected.  If there are questions or corrections about the content of this note or the information contained within the body of this dictation they should be directly addressed with the author for clarification.

## 2022-01-09 ENCOUNTER — Telehealth (INDEPENDENT_AMBULATORY_CARE_PROVIDER_SITE_OTHER): Payer: Self-pay | Admitting: Family Medicine

## 2022-01-09 NOTE — Telephone Encounter (Signed)
Pt called to go over the lab results.  Informed the providers recommendations.  Per pt he will follow up with the Oncologist Dr.Ajay Dar in 2 weeks.  Pt will follow up with the PCP next week.  Pt has no symptoms right now.

## 2022-01-09 NOTE — Progress Notes (Signed)
Please call the patient and review the recommendations.    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  Please follow-up with your oncologist as soon as possible.  Kidney function got worse compared to last time.  Please make a follow-up appointment to discuss.    Regards,  Danae Orleans, MD.

## 2022-01-12 ENCOUNTER — Encounter (INDEPENDENT_AMBULATORY_CARE_PROVIDER_SITE_OTHER): Payer: Self-pay

## 2022-01-12 DIAGNOSIS — Z95810 Presence of automatic (implantable) cardiac defibrillator: Secondary | ICD-10-CM

## 2022-01-13 ENCOUNTER — Other Ambulatory Visit (FREE_STANDING_LABORATORY_FACILITY): Payer: Commercial Managed Care - POS

## 2022-01-13 ENCOUNTER — Encounter (INDEPENDENT_AMBULATORY_CARE_PROVIDER_SITE_OTHER): Payer: Self-pay | Admitting: Family Medicine

## 2022-01-13 ENCOUNTER — Ambulatory Visit (INDEPENDENT_AMBULATORY_CARE_PROVIDER_SITE_OTHER): Payer: Commercial Managed Care - POS | Admitting: Family Medicine

## 2022-01-13 VITALS — BP 112/78 | HR 90 | Temp 97.6°F | Resp 16 | Ht 69.29 in | Wt 198.0 lb

## 2022-01-13 DIAGNOSIS — I4729 Other ventricular tachycardia: Secondary | ICD-10-CM

## 2022-01-13 DIAGNOSIS — R197 Diarrhea, unspecified: Secondary | ICD-10-CM

## 2022-01-13 DIAGNOSIS — H6123 Impacted cerumen, bilateral: Secondary | ICD-10-CM

## 2022-01-13 DIAGNOSIS — Z0000 Encounter for general adult medical examination without abnormal findings: Secondary | ICD-10-CM

## 2022-01-13 LAB — LIPID PANEL
Cholesterol / HDL Ratio: 4.1 Index
Cholesterol: 132 mg/dL (ref 0–199)
HDL: 32 mg/dL — ABNORMAL LOW (ref 40–9999)
LDL Calculated: 86 mg/dL (ref 0–99)
Triglycerides: 72 mg/dL (ref 34–149)
VLDL Calculated: 14 mg/dL (ref 10–40)

## 2022-01-13 NOTE — Progress Notes (Addendum)
Date: 01/13/2022 9:20 AM   Patient ID: [name] [name] is a 76 y.o. male.         Have you seen any specialists since your last visit with [place]?  No      The patient was informed that the following HM items are still outstanding:   colonoscopy, shingles vaccine, and tetanus vaccine and advance directive     Subjective:      Chief Complaint:  Chief Complaint   Patient presents with    Annual Exam     Pt is fasting        HPI:  Visit Type: Health Maintenance Visit  Work Status: working part-time  Reported Health: fair health  Reported Diet: compliant with well-balanced diet and not strict vegetarian diet   Reported Exercise: 3-4x/week, 30-60 minutes/day, walking, and jogging/running  Dental: regular dental visits twice a year  Vision: regular eye exams   Hearing: normal hearing  Immunization Status: Tdap vaccination due and Shingles vaccination due  Reproductive Health: sexually active  Prior Screening Tests: prostate cancer screening not appropriate at this time  General Health Risks: no family history of prostate cancer, no family history of colon cancer, family history of breast cancer, and mother  Safety Elements Used: uses seat belts, smoke detectors in household, carbon monoxide detectors in household, and does not text and drive    Patient feels overall great, is exercising at least 3 times a week and is on a good diet.        Problem List:  Patient Active Problem List   Diagnosis    Chest pain    Type 2 diabetes mellitus with complication, with long-term current use of insulin    Heart attack    CAD (coronary artery disease)    Non-ischemic cardiomyopathy    Acute exacerbation of CHF (congestive heart failure)    PUD (peptic ulcer disease)    Elevated LFTs    Acute dyspnea    Elevated d-dimer    CHF (congestive heart failure)    Headaches due to old head injury    Essential hypertension    Pneumonia due to infectious organism, unspecified laterality, unspecified part of lung    Syncope and collapse    AICD  (automatic cardioverter/defibrillator) present    Syncope, unspecified syncope type    Pneumonia due to infectious organism    Dyspnea, unspecified type    Cardiomyopathy    Cough    Sepsis    Sepsis, due to unspecified organism    SOB (shortness of breath)    History of colon polyps    GERD (gastroesophageal reflux disease)    Hypertensive heart disease without heart failure    Atherosclerotic heart disease of native coronary artery without angina pectoris    Heart failure, unspecified    Nonischemic cardiomyopathy    BO (blackout)    Belching    Acute subendocardial infarction, subsequent episode of care    Difficulty sleeping    Dizziness    Encounter for examination for normal comparison and control in clinical research program    Fatigue    Nasal congestion    Paroxysmal ventricular tachycardia    Pure hypercholesterolemia    Tachycardia, unspecified    Chronic systolic congestive heart failure    Encounter for implantable defibrillator reprogramming or check    Episode of recurrent major depressive disorder, unspecified depression episode severity    Stage 3 chronic kidney disease, 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  Anxiety and depression    COPD (chronic obstructive pulmonary disease)    HLD (hyperlipidemia)    Primary central sleep apnea    Sleep apnea, obstructive    Cheyne-Stokes respiration       Current Medications:  Outpatient Medications Marked as Taking for the 01/13/22 encounter (Office Visit) with Zorita Pang, MD   Medication Sig Dispense Refill    aspirin EC 81 MG EC tablet Take 81 mg by mouth daily      atorvastatin (LIPITOR) 40 MG tablet Take 1 tablet (40 mg total) by mouth nightly 90 tablet 0    carvedilol (COREG) 25 MG tablet Take 1 tablet (25 mg total) by mouth 2 (two) times daily with meals 180 tablet 3    empagliflozin (JARDIANCE) 10 MG tablet Take 1 tablet (10 mg total) by mouth daily 90 tablet 3    fluticasone furoate-vilanterol (Breo Ellipta) 100-25 MCG/INH Aerosol Pwdr, Breath  Activated Inhale 1 puff into the lungs daily 3 each 3    glucose blood (ONE TOUCH ULTRA TEST) test strip 1 each by Other route 2 (two) times daily Use as instructed 300 each 1    hydrocodone-chlorpheniramine (TUSSIONEX) 10-8 MG/5ML extended-release suspension Take 5 mLs by mouth 2 (two) times daily 115 mL 0    insulin glargine (LANTUS SOLOSTAR) 100 UNIT/ML injection pen Patient takes 50 units at night 45 mL 3    Insulin Pen Needle (BD Pen Needle Nano U/F) 32G X 4 MM Misc Use as directed bid 200 each 3    metOLazone (ZAROXOLYN) 2.5 MG tablet Take 1 tablet (2.5 mg total) by mouth once a week 12 tablet 3    niacin (NIASPAN) 500 MG CR tablet TAKE 1 TABLET BY MOUTH TWICE DAILY 180 tablet 3    PARoxetine (Paxil) 20 MG tablet Take 1 tablet (20 mg total) by mouth every morning 30 tablet 11    sacubitril-valsartan (Entresto) 97-103 MG Tab per tablet Take 1 tablet by mouth 2 (two) times daily 180 tablet 3    Semglee, yfgn, 100 UNIT/ML Solution Pen-injector Inject 50 Units into the skin daily as needed (glucose level) 90 mL 1    SITagliptin-metFORMIN (Janumet) 50-500 MG tablet Take 1 tablet by mouth 2 (two) times daily with meals 60 tablet 5    spironolactone (ALDACTONE) 25 MG tablet Take 0.5 tablets (12.5 mg total) by mouth daily 45 tablet 3    torsemide (Demadex) 20 MG tablet Take 2 tablets (40 mg) by mouth every morning AND 1 tablet (20 mg) every evening. 270 tablet 3       Allergies:  No Known Allergies    Past Medical History:  Past Medical History:   Diagnosis Date    Acute CHF     NOV  & DEC 2018, - DIALYSIS CATH PLACED Aug 24 2017 TO REMOVE FLUID     Acute systolic (congestive) heart failure 08/2017    Anxiety     CAD (coronary artery disease)     Cardiomyopathy     nonischemic    CHF (congestive heart failure) 08/12/2014, 2013    Chronic obstructive pulmonary disease     POSSIBLE PER PT HE USES  SYNBICORT BID ABD  BREO INHALER PRN    Coronary artery disease 2011    CORONARY STENT PLACEMENT FOLLOWED BY [facility]     Depression     echocardiogram 02/2016, 11/2016, 09/2017, 12/20/2017    GERD (gastroesophageal reflux disease)     Heart attack 2011    and 2014  Hyperlipemia     Hypertension     ICD (implantable 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     PLACED 2014  [facility]  LAST INTERROGATION April 01 2018 REPORT REQUESTED    Ischemic cardiomyopathy     EF 15% ON ECHO 09-20-2017 DR GARG IN [facility]    Nonischemic cardiomyopathy     NSTEMI (non-ST elevated myocardial infarction)     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    Pacemaker 2014    MEDTRONIC ICD/PACEMAKER COMBO LAST INTERROGATION  12-12 2018    Pneumonia 09/2016    Primary cardiomyopathy     Ischemic cardiomyopathy EF 15% ON ECHO 09-20-2017 DR GARG IN [facility]    Sepsis 08/2017    Syncope and collapse     PRIOR TO ICD/PACEMAKER PLACED 2014    Type 2 diabetes mellitus, controlled DX 1998    BS  AVG 100  A1C 7.4 Dec 27 2017    [facility]     PT SEE HIS PMD April 01 2018 RE THIS-TOLD TO SEE PMD BY [facility] JUNE 12 WHEN THEY SAW HIM FOR A CL       Past Surgical History:  Past Surgical History:   Procedure Laterality Date    BIV  11/10/2016    ICD METRONIC  UPGRADE     CARDIAC CATHETERIZATION  03/2010    CORONARY STENT PLACEMENT X 2 PER PT; LM normal, LAD 70-80% distal lesion at apex, 20% lesion mid CFX    CARDIAC CATHETERIZATION  12/2012    CARDIAC DEFIBRILLATOR PLACEMENT  12/2012    [facility]    CARDIAC PACEMAKER PLACEMENT  2014    MEDTRONIC ICD/PACEMAKER PLACED [facility]    CIRCUMCISION  AGE 89    COLONOSCOPY  2009    CORRECTION HAMMER TOE      duodenal ulcer  1973    STRESS RELATED IN SCHOOL    ECHOCARDIOGRAM, TRANSTHORACIC  02/2016 11/2016 09/2017 12/2017    ECHOCARDIOGRAM, TRANSTHORACIC  02/2016,11/2016,12/20/2017    EGD N/A 08/15/2014    Procedure: EGD;  Surgeon: Colon Branch, MD;  Location: [facility];  Service: Gastroenterology;  Laterality: N/A;  egd w/ bx    EGD  1975    EGD, COLONOSCOPY N/A 04/04/2018    Procedure: EGD with bxs, COLONOSCOPY with polypectomy and clipping;  Surgeon: Doyne Keel, MD;  Location: [facility];  Service: Gastroenterology;  Laterality: N/A;    FRACTURE SURGERY  AGE 33     RT  ANKLE- CAST APPLIED    HERNIA REPAIR  AGE 89    LEFT INGUINAL HERNIA    MPI nuclear study  06/2016    orthopedic surgery  AGE 27    RT foot corrective - HAMMERTOE    TONSILLECTOMY AND ADENOIDECTOMY  1954       Family History:  Family History   Problem Relation Age of Onset    Breast cancer Mother     Heart attack Mother 106    Hypertension Mother     Diabetes Mother     Coronary artery disease Mother     Diabetes Brother     Heart attack Father     Hypertension Father        Social History:  Social History     Tobacco Use    Smoking status: Never    Smokeless tobacco: Never   Vaping Use    Vaping status: Never Used   Substance Use Topics    Alcohol use: No  Alcohol/week: 0.0 standard drinks of alcohol     Comment: occasionally    Drug use: No        The following sections were reviewed this encounter by the provider:   Tobacco  Allergies  Meds  Problems  Med Hx  Surg Hx  Fam Hx         Vitals:  BP 112/78 (BP Site: Left arm, Patient Position: Sitting, Cuff Size: Medium)   Pulse 90   Temp 97.6 F (36.4 C) (Temporal)   Resp 16   Ht 1.76 m (5' 9.29")   Wt 89.8 kg (198 lb)   SpO2 97%   BMI 28.99 kg/m       ROS:   General/Constitutional:   Denies Change in appetite. Denies Chills. Denies Fatigue. Denies Fever. Denies Weight gain. Denies Weight loss.   Ophthalmologic:   Denies Blurred vision. Denies Diminished visual acuity. Denies Eye Pain.   ENT:   Denies Hearing Loss. Denies Nasal Discharge. Denies Hoarseness. Denies Ear pain. Denies Nosebleed. Denies Sinus pain. Denies Sore throat. Denies Sneezing.   Endocrine:   Denies Polydipsia. Denies Polyuria.   Cardiovascular:   Denies Chest pain. Denies Chest pain with exertion. Denies Leg Claudication. Denies Palpitations. Denies Swelling in hands/feet.   Respiratory:   Denies Paroxysmal Nocturnal Dyspnea. Denies Cough. Denies  Orthopnea. Denies Shortness of breath. Denies Daytime Hypersomnolence. Denies Snoring. Denies Witness Apnea. Denies Wheezing.   Gastrointestinal:   Denies Abdominal pain. Denies Blood in stool. Denies Constipation.  Diarrhea. Denies Heartburn. Denies Nausea. Denies Vomiting.   Hematology:   Denies Easy bruising. Denies Easy Bleeding.   Genitourinary:   Denies Blood in urine. Denies Nocturia.   Musculoskeletal:   Denies Joint pain. Denies Leg cramps. Denies Weakness in LE. Denies Swollen joints.   Peripheral Vascular:   Denies Cold extremities. Denies Decreased sensation in extremities. Denies Painful extremities.   Skin:   Denies Itching. Denies Change in Mole(s). Denies Rash.   Neurologic:   Denies Balance difficulty. Denies Dizziness. Denies Headache. Denies Pre-Syncope. Denies Memory loss.   Psychiatric:   Denies Anxiety. Denies Depressed mood. Denies Difficulty sleeping. Denies Mood Swings.     Objective:     Examination:   General Examination:   GENERAL APPEARANCE: alert, in no acute distress, well developed, well nourished, oriented to time, place, and person.   HEAD: normal appearance, atraumatic.   EYES: extraocular movement intact (EOMI), pupils equal, round, reactive to light and accommodation, sclera anicteric, conjunctiva clear.   EARS: tympanic membranes normal bilaterally, both external ear canals are totally clogged with cerumen.  NOSE: normal nasal mucosa, no lesions.   ORAL CAVITY: normal oropharynx, normal lips, mucosa moist, no lesions.   THROAT: normal appearance, clear, no erythema.   NECK/THYROID: neck supple, no carotid bruit, carotid pulse 2+ bilaterally, no cervical lymphadenopathy, no neck mass palpated, no jugular venous distention, no thyromegaly.   LYMPH NODES: no palpable adenopathy.   SKIN: good turgor, no rashes, no suspicious lesions.   HEART: S1, S2 normal, no murmurs, rubs, gallops, regular rate and rhythm.   LUNGS: normal effort / no distress, normal breath sounds, clear to  auscultation bilaterally, no wheezes, rales, rhonchi.   ABDOMEN: bowel sounds present, no hepatosplenomegaly, soft, nontender, nondistended.   RECTAL: not examined.   MALE GENITOURINARY: no penile lesions or discharge, no testicular mass.   MUSCULOSKELETAL: full range of motion, no swelling or deformity.   EXTREMITIES: no edema, no clubbing, cyanosis, or edema.   PERIPHERAL PULSES: 2+ dorsalis pedis,  2+ posterior tibial.   NEUROLOGIC: nonfocal, cranial nerves 2-12 grossly intact, deep tendon reflexes 2+ symmetrical, normal strength, tone and reflexes, sensory exam intact.   PSYCH: cognitive function intact, mood/affect full range, speech clear.     Assessment/Plan:       1. Well adult exam  - Lipid panel  Recommend to continue to push exercise and stay on a good diet . Try to get to the perfect BMI of 25. Start your day with a high fiber diet and  involve the family in your exercise regiment .    2. Paroxysmal ventricular tachycardia  Has cardiologist    3. Bilateral impacted cerumen  Removed cerumen from both the ears with curettage.  Both the ears looked normal after cerumen removal.    Most of patient's blood work was already done I went with the patient over all his previous blood work.  His hemoglobin A1c is excellent but his kidney function the GFR is slowly decreasing.  Patient sees his oncologist and his cardio pathologist in the future.  I told him as long as the GFR is not decreasing anymore we can just keep an eye on it but otherwise I will send him to a nephrologist for for further evaluation and treatment.

## 2022-01-14 ENCOUNTER — Ambulatory Visit (INDEPENDENT_AMBULATORY_CARE_PROVIDER_SITE_OTHER): Payer: Commercial Managed Care - POS | Admitting: Family Medicine

## 2022-01-14 LAB — CLOSTRIDIUM DIFFICILE TOXIN B PCR: Stool Clostridium difficile Toxin B PCR: NEGATIVE

## 2022-01-14 LAB — STOOL OCCULT BLOOD, IMMUNOASSAY, (NOT GUAIAC BASED): Stool Occult Blood Immunoassay: NEGATIVE

## 2022-01-14 NOTE — Progress Notes (Signed)
Dear Smitty Cords:    Stool studies are negative    Regards,  Danae Orleans, MD.

## 2022-01-15 ENCOUNTER — Telehealth (INDEPENDENT_AMBULATORY_CARE_PROVIDER_SITE_OTHER): Payer: Self-pay | Admitting: Specialist

## 2022-01-15 NOTE — Telephone Encounter (Signed)
Patient call in regards to lab done with Dr Marlowe Kays.  Notified with results.

## 2022-01-20 ENCOUNTER — Encounter (INDEPENDENT_AMBULATORY_CARE_PROVIDER_SITE_OTHER): Payer: Self-pay | Admitting: Family Medicine

## 2022-01-21 ENCOUNTER — Ambulatory Visit (INDEPENDENT_AMBULATORY_CARE_PROVIDER_SITE_OTHER): Payer: Commercial Managed Care - POS | Admitting: Family Medicine

## 2022-02-01 ENCOUNTER — Other Ambulatory Visit (INDEPENDENT_AMBULATORY_CARE_PROVIDER_SITE_OTHER): Payer: Commercial Managed Care - POS | Admitting: Cardiovascular Disease

## 2022-02-01 LAB — REMOTE CARDIAC DEVICE MONITORING
AF Burden Percentage: 0
ICD Shock Recent Count: 0
RV Pacing Percentage: 3.31

## 2022-02-07 ENCOUNTER — Other Ambulatory Visit (INDEPENDENT_AMBULATORY_CARE_PROVIDER_SITE_OTHER): Payer: Self-pay | Admitting: Family Medicine

## 2022-02-07 DIAGNOSIS — Z794 Long term (current) use of insulin: Secondary | ICD-10-CM

## 2022-02-18 ENCOUNTER — Encounter (INDEPENDENT_AMBULATORY_CARE_PROVIDER_SITE_OTHER): Payer: Self-pay

## 2022-02-23 ENCOUNTER — Other Ambulatory Visit (INDEPENDENT_AMBULATORY_CARE_PROVIDER_SITE_OTHER): Payer: Self-pay | Admitting: Family Medicine

## 2022-02-23 NOTE — Telephone Encounter (Signed)
Last seen on 01/13/22    RX Pended, please advise and refill or reject if appropriate.,     No diagnosis code added to the med.

## 2022-02-24 ENCOUNTER — Other Ambulatory Visit (INDEPENDENT_AMBULATORY_CARE_PROVIDER_SITE_OTHER): Payer: Self-pay | Admitting: Nurse Practitioner

## 2022-02-24 ENCOUNTER — Telehealth (INDEPENDENT_AMBULATORY_CARE_PROVIDER_SITE_OTHER): Payer: Self-pay | Admitting: Family Medicine

## 2022-02-24 DIAGNOSIS — J449 Chronic obstructive pulmonary disease, unspecified: Secondary | ICD-10-CM

## 2022-02-24 DIAGNOSIS — I5022 Chronic systolic (congestive) heart failure: Secondary | ICD-10-CM

## 2022-02-24 DIAGNOSIS — I428 Other cardiomyopathies: Secondary | ICD-10-CM

## 2022-02-24 NOTE — Telephone Encounter (Signed)
[facility] sent a drug change request.  fluticasone furoate-vilanterol (Breo Ellipta) 100-25 MCG/INH Aerosol Pwdr, Breath Activated  [facility] stated that the generically equivalent drug product or preferred alternative is fluticasone salmeterol, wixela inhub, advair hfa, breo ellipta, dulera, Symbicort. Please send a prescription with the strength, directions, quantity and refills.   Please advise and send another prescription.

## 2022-02-25 MED ORDER — FLUTICASONE-SALMETEROL 100-50 MCG/ACT IN AEPB
1.0000 | INHALATION_SPRAY | Freq: Two times a day (BID) | RESPIRATORY_TRACT | 3 refills | Status: DC
Start: 2022-02-25 — End: 2022-09-09

## 2022-02-25 NOTE — Addendum Note (Signed)
Addended byZorita Pang on: 02/25/2022 08:43 AM     Modules accepted: Orders

## 2022-02-25 NOTE — Telephone Encounter (Signed)
Called Pt and informed him of the provider's note

## 2022-02-26 ENCOUNTER — Ambulatory Visit (INDEPENDENT_AMBULATORY_CARE_PROVIDER_SITE_OTHER): Payer: Commercial Managed Care - POS | Admitting: Cardiovascular Disease

## 2022-03-03 ENCOUNTER — Ambulatory Visit (FREE_STANDING_LABORATORY_FACILITY): Payer: Commercial Managed Care - POS | Admitting: Family

## 2022-03-03 ENCOUNTER — Encounter (INDEPENDENT_AMBULATORY_CARE_PROVIDER_SITE_OTHER): Payer: Self-pay | Admitting: Family

## 2022-03-03 VITALS — BP 103/67 | HR 72 | Temp 97.3°F | Resp 18 | Wt 217.0 lb

## 2022-03-03 DIAGNOSIS — N5089 Other specified disorders of the male genital organs: Secondary | ICD-10-CM

## 2022-03-03 LAB — POCT URINALYSIS AUTOMATED (IAH)
Bilirubin, UA POCT: NEGATIVE
Blood, UA POCT: NEGATIVE
Glucose, UA POCT: NEGATIVE
Ketones, UA POCT: NEGATIVE mg/dL
Nitrite, UA POCT: NEGATIVE
PH, UA POCT: 6 (ref 4.6–8)
Protein, UA POCT: 30 mg/dL — AB
Specific Gravity, UA POCT: 1.015 mg/dL (ref 1.001–1.035)
Urine Leukocytes POCT: NEGATIVE
Urobilinogen, UA POCT: 4 mg/dL — AB

## 2022-03-03 NOTE — Progress Notes (Signed)
[facility]    PROGRESS NOTE      Patient: [name] [name]   Date: 03/03/2022   MRN: [id number]     Past Medical History:   Diagnosis Date    Acute CHF     NOV  & DEC 2018, - DIALYSIS CATH PLACED Aug 24 2017 TO REMOVE FLUID     Acute systolic (congestive) heart failure 08/2017    Anxiety     CAD (coronary artery disease)     Cardiomyopathy     nonischemic    CHF (congestive heart failure) 08/12/2014, 2013    Chronic obstructive pulmonary disease     POSSIBLE PER PT HE USES  SYNBICORT BID ABD  BREO INHALER PRN    Coronary artery disease 2011    CORONARY STENT PLACEMENT FOLLOWED BY [facility]    Depression     echocardiogram 02/2016, 11/2016, 09/2017, 12/20/2017    GERD (gastroesophageal reflux disease)     Heart attack 2011    and 2014    Hyperlipemia     Hypertension     ICD (implantable 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     PLACED 2014  [facility]  LAST INTERROGATION April 01 2018 REPORT REQUESTED    Ischemic cardiomyopathy     EF 15% ON ECHO 09-20-2017 DR GARG IN [facility]    Nonischemic cardiomyopathy     NSTEMI (non-ST elevated myocardial infarction)     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    Pacemaker 2014    MEDTRONIC ICD/PACEMAKER COMBO LAST INTERROGATION  12-12 2018    Pneumonia 09/2016    Primary cardiomyopathy     Ischemic cardiomyopathy EF 15% ON ECHO 09-20-2017 DR GARG IN [facility]    Sepsis 08/2017    Syncope and collapse     PRIOR TO ICD/PACEMAKER PLACED 2014    Type 2 diabetes mellitus, controlled DX 1998    BS  AVG 100  A1C 7.4 Dec 27 2017    [facility]     PT SEE HIS PMD April 01 2018 RE THIS-TOLD TO SEE PMD BY [facility] JUNE 12 WHEN THEY SAW HIM FOR A CL     Social History     Socioeconomic History    Marital status: Married     Spouse name: Teacher, music    Number of children: 0   Occupational History    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, [place]    Tobacco Use    Smoking status: Never    Smokeless tobacco: Never   Vaping Use    Vaping status: Never Used   Substance and Sexual Activity    Alcohol use: No     Alcohol/week:  0.0 standard drinks of alcohol     Comment: occasionally    Drug use: No    Sexual activity: Yes     Partners: Female     Family History   Problem Relation Age of Onset    Breast cancer Mother     Heart attack Mother 52    Hypertension Mother     Diabetes Mother     Coronary artery disease Mother     Diabetes Brother     Heart attack Father     Hypertension Father        ASSESSMENT/PLAN     [name] [name] is a 76 y.o. male    Chief Complaint   Patient presents with    Testicle Pain        There are no diagnoses linked to this encounter.  No results found for this or any previous visit (from the past 24 hour(s)).      Risk & Benefits of the new medication(s) were explained to the patient (and family) who verbalized understanding & agreed to the treatment plan. Patient (family) encouraged to contact me/clinical staff with any questions/concerns      MEDICATIONS     No outpatient medications have been marked as taking for the 03/03/22 encounter (Office Visit) with Venetia Night, FNP.         No Known Allergies    SUBJECTIVE     Chief Complaint   Patient presents with    Testicle Pain        Testicle Pain  The patient's primary symptoms include testicular pain. This is a new problem. Episode onset: 2 days ago. The problem has been rapidly worsening. The pain is moderate. Pertinent negatives include no diarrhea or dysuria. There is swelling in the right testicle. The color of the testicles is Normal. The symptoms are aggravated by activity. He has tried nothing for the symptoms. He is sexually active.       ROS     Review of Systems   Gastrointestinal:  Negative for diarrhea.   Genitourinary:  Positive for testicular pain. Negative for dysuria.        The following sections were reviewed this encounter by the provider:        PHYSICAL EXAM     Vitals:    03/03/22 1431   Weight: 98.4 kg (217 lb)       Physical Exam  Ortho Exam  Neurologic Exam    PROCEDURE(S)     Procedures        Signed,  Venetia Night,  FNP  03/03/2022

## 2022-03-03 NOTE — Progress Notes (Signed)
[facility]    PROGRESS NOTE      Patient: [name] [name]   Date: 03/03/2022   MRN: [id number]     Past Medical History:   Diagnosis Date    Acute CHF     NOV  & DEC 2018, - DIALYSIS CATH PLACED Aug 24 2017 TO REMOVE FLUID     Acute systolic (congestive) heart failure 08/2017    Anxiety     CAD (coronary artery disease)     Cardiomyopathy     nonischemic    CHF (congestive heart failure) 08/12/2014, 2013    Chronic obstructive pulmonary disease     POSSIBLE PER PT HE USES  SYNBICORT BID ABD  BREO INHALER PRN    Coronary artery disease 2011    CORONARY STENT PLACEMENT FOLLOWED BY [facility]    Depression     echocardiogram 02/2016, 11/2016, 09/2017, 12/20/2017    GERD (gastroesophageal reflux disease)     Heart attack 2011    and 2014    Hyperlipemia     Hypertension     ICD (implantable 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     PLACED 2014  [facility]  LAST INTERROGATION April 01 2018 REPORT REQUESTED    Ischemic cardiomyopathy     EF 15% ON ECHO 09-20-2017 DR GARG IN [facility]    Nonischemic cardiomyopathy     NSTEMI (non-ST elevated myocardial infarction)     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    Pacemaker 2014    MEDTRONIC ICD/PACEMAKER COMBO LAST INTERROGATION  12-12 2018    Pneumonia 09/2016    Primary cardiomyopathy     Ischemic cardiomyopathy EF 15% ON ECHO 09-20-2017 DR GARG IN EP[facility]    Sepsis 08/2017    Syncope and collapse     PRIOR TO ICD/PACEMAKER PLACED 2014    Type 2 diabetes mellitus, controlled DX 1998    BS  AVG 100  A1C 7.4 Dec 27 2017    [facility]     PT SEE HIS PMD April 01 2018 RE THIS-TOLD TO SEE PMD BY [facility] JUNE 12 WHEN THEY SAW HIM FOR A CL     Social History     Tobacco Use    Smoking status: Never    Smokeless tobacco: Never   Vaping Use    Vaping status: Never Used   Substance Use Topics    Alcohol use: No     Alcohol/week: 0.0 standard drinks of alcohol     Comment: occasionally    Drug use: No      Family History   Problem Relation Age of Onset    Breast cancer Mother     Heart attack Mother 9     Hypertension Mother     Diabetes Mother     Coronary artery disease Mother     Diabetes Brother     Heart attack Father     Hypertension Father        ASSESSMENT/PLAN     [name] [name] is a 76 y.o. male    Chief Complaint   Patient presents with    Testicle Pain        1. Scrotal edema  - US Scrotum and Testicles w Doppler Ltd; Future  - POCT UA Clinitek AX (urine dipstick)  - Urine culture    Acute, stable: Labs and culture done, will contact with results. Scrotal US ordered, will refer/treat as needed. Advised to elevate scrotum and follow up with PCP as needed. Go to  ED if any pain, fevers or difficulty urinating. Pt agreeable to plan.   Ddx" Infection, hydrocole, variocele, hernia, trauma, epidymidis, STI.      No results found for this or any previous visit (from the past 24 hour(s)).      Risk & Benefits of the new medication(s) were explained to the patient (and family) who verbalized understanding & agreed to the treatment plan. Patient (family) encouraged to contact me/clinical staff with any questions/concerns      MEDICATIONS     Outpatient Medications Marked as Taking for the 03/03/22 encounter (Office Visit) with Venetia Night, FNP   Medication Sig Dispense Refill    aspirin EC 81 MG EC tablet Take 1 tablet (81 mg) by mouth daily      atorvastatin (LIPITOR) 40 MG tablet Take 1 tablet (40 mg total) by mouth nightly 90 tablet 0    carvedilol (COREG) 25 MG tablet Take 1 tablet (25 mg total) by mouth 2 (two) times daily with meals 180 tablet 3    empagliflozin (JARDIANCE) 10 MG tablet Take 1 tablet (10 mg total) by mouth daily 90 tablet 3    fluticasone-salmeterol (ADVAIR DISKUS) 100-50 MCG/ACT Aerosol Pwdr, Breath Activated Inhale 1 puff into the lungs 2 (two) times daily 3 each 3    glucose blood (ONE TOUCH ULTRA TEST) test strip 1 each by Other route 2 (two) times daily Use as instructed 300 each 1    hydrocodone-chlorpheniramine (TUSSIONEX) 10-8 MG/5ML extended-release suspension Take 5 mLs  by mouth 2 (two) times daily 115 mL 0    insulin glargine (LANTUS SOLOSTAR) 100 UNIT/ML injection pen Patient takes 50 units at night 45 mL 3    Insulin Pen Needle (BD Pen Needle Nano U/F) 32G X 4 MM Misc Use as directed bid 200 each 3    metOLazone (ZAROXOLYN) 2.5 MG tablet Take 1 tablet (2.5 mg total) by mouth once a week 12 tablet 3    niacin (NIASPAN) 500 MG CR tablet TAKE 1 TABLET BY MOUTH TWICE DAILY 180 tablet 3    PARoxetine (Paxil) 20 MG tablet Take 1 tablet (20 mg total) by mouth every morning 30 tablet 11    sacubitril-valsartan (Entresto) 97-103 MG Tab per tablet Take 1 tablet by mouth 2 (two) times daily 180 tablet 3    Semglee, yfgn, 100 UNIT/ML Solution Pen-injector ADMINISTER 50 UNITS UNDER THE SKIN AT BEDTIME 45 mL 3    SITagliptin-metFORMIN (Janumet) 50-500 MG tablet Take 1 tablet by mouth 2 (two) times daily with meals 60 tablet 5    spironolactone (ALDACTONE) 25 MG tablet Take 0.5 tablets (12.5 mg total) by mouth daily 45 tablet 3    torsemide (Demadex) 20 MG tablet Take 2 tablets (40 mg) by mouth every morning AND 1 tablet (20 mg) every evening. 270 tablet 3         No Known Allergies    SUBJECTIVE     Chief Complaint   Patient presents with    Testicle Pain        Pt has 2 days of swelling in foreskin and scrotum. Denies any pain, trauma, discharge, fevers. No treatment tried. Denies need for STI testing.         ROS     Review of Systems   Constitutional: Negative.    HENT: Negative.     Eyes: Negative.    Respiratory: Negative.     Cardiovascular: Negative.    Gastrointestinal: Negative.    Genitourinary:  Positive for  penile swelling and scrotal swelling. Negative for decreased urine volume, difficulty urinating, dysuria, enuresis, flank pain, frequency, genital sores, hematuria, penile discharge, penile pain, testicular pain and urgency.   Musculoskeletal: Negative.    Skin: Negative.    Neurological: Negative.    Hematological: Negative.    Psychiatric/Behavioral: Negative.          The  following sections were reviewed this encounter by the provider:   Tobacco  Allergies  Meds  Problems  Med Hx  Surg Hx  Fam Hx         PHYSICAL EXAM     Vitals:    03/03/22 1431   BP: 103/67   BP Site: Left arm   Patient Position: Sitting   Cuff Size: Large   Pulse: 72   Resp: 18   Temp: 97.3 F (36.3 C)   TempSrc: Temporal   SpO2: 98%   Weight: 98.4 kg (217 lb)       Physical Exam  Vitals and nursing note reviewed.   Constitutional:       General: He is awake. He is not in acute distress.     Appearance: Normal appearance. He is well-developed and overweight. He is not ill-appearing.   HENT:      Head: Normocephalic.   Cardiovascular:      Rate and Rhythm: Normal rate and regular rhythm.      Heart sounds: Normal heart sounds.   Pulmonary:      Effort: Pulmonary effort is normal.      Breath sounds: Normal breath sounds.   Musculoskeletal:      Cervical back: Normal range of motion and neck supple.   Skin:     General: Skin is warm and dry.   Neurological:      General: No focal deficit present.      Mental Status: He is alert and oriented to person, place, and time. Mental status is at baseline.   Psychiatric:         Mood and Affect: Mood normal.         Behavior: Behavior normal. Behavior is cooperative.         Thought Content: Thought content normal.         Judgment: Judgment normal.       Ortho Exam  Neurologic Exam     Mental Status   Oriented to person, place, and time.       PROCEDURE(S)     Procedures        Signed,  Venetia Night, FNP  03/03/2022

## 2022-03-06 ENCOUNTER — Ambulatory Visit
Admission: RE | Admit: 2022-03-06 | Discharge: 2022-03-06 | Disposition: A | Discharge status: Home / Self Care | Payer: Commercial Managed Care - POS | Source: Ambulatory Visit | Attending: Family | Admitting: Family

## 2022-03-06 ENCOUNTER — Ambulatory Visit (INDEPENDENT_AMBULATORY_CARE_PROVIDER_SITE_OTHER): Payer: Commercial Managed Care - POS | Admitting: Family Medicine

## 2022-03-06 ENCOUNTER — Encounter (INDEPENDENT_AMBULATORY_CARE_PROVIDER_SITE_OTHER): Payer: Self-pay | Admitting: Family Medicine

## 2022-03-06 VITALS — BP 110/71 | HR 97 | Temp 98.2°F | Resp 21 | Wt 219.0 lb

## 2022-03-06 DIAGNOSIS — R600 Localized edema: Secondary | ICD-10-CM | POA: Insufficient documentation

## 2022-03-06 DIAGNOSIS — N433 Hydrocele, unspecified: Secondary | ICD-10-CM | POA: Insufficient documentation

## 2022-03-06 DIAGNOSIS — N5089 Other specified disorders of the male genital organs: Secondary | ICD-10-CM

## 2022-03-06 NOTE — Progress Notes (Signed)
Subjective:      Date: 03/06/2022 2:56 PM   Patient ID: [name] [name] is a 76 y.o. male.    Chief Complaint:  Chief Complaint   Patient presents with    Groin Swelling     Follow up, patient had u/s done today        HPI:  HPI  Patient has scrotal swelling.  He was already seen in urgent care yesterday I talked to the treating physician.  The ultrasound came back negative there is a hydrocele which I do not think has anything to do with the swelling itself.  Patient's urine was all normal.  Patient is uncomfortable.  I called around which urology office could see him.  Or referral coordinator managed to get him in with Monday I discussed this with the patient that we could have him seen by urology on Mondays but if symptoms get worse to go to the emergency room.  Patient agreed with this plan.  The scrotum does not look infected at my best guess is that the swelling has something to do with fluid but it is quite extreme.        Problem List:  Patient Active Problem List   Diagnosis    Type 2 diabetes mellitus with complication, with long-term current use of insulin    Heart attack    CAD (coronary artery disease)    Non-ischemic cardiomyopathy    Acute exacerbation of CHF (congestive heart failure)    PUD (peptic ulcer disease)    Elevated LFTs    Acute dyspnea    CHF (congestive heart failure)    Headaches due to old head injury    Essential hypertension    Syncope and collapse    AICD (automatic cardioverter/defibrillator) present    Syncope, unspecified syncope type    Dyspnea, unspecified type    Cardiomyopathy    Cough    Sepsis    Sepsis, due to unspecified organism    SOB (shortness of breath)    History of colon polyps    GERD (gastroesophageal reflux disease)    Hypertensive heart disease without heart failure    Atherosclerotic heart disease of native coronary artery without angina pectoris    Heart failure, unspecified    Nonischemic cardiomyopathy    BO (blackout)    Belching    Acute  subendocardial infarction, subsequent episode of care    Difficulty sleeping    Dizziness    Encounter for examination for normal comparison and control in clinical research program    Fatigue    Nasal congestion    Paroxysmal ventricular tachycardia    Pure hypercholesterolemia    Tachycardia, unspecified    Chronic systolic congestive heart failure    Encounter for implantable defibrillator reprogramming or check    Episode of recurrent major depressive disorder, unspecified depression episode severity    Stage 3 chronic kidney disease, 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    Anxiety and depression    COPD (chronic obstructive pulmonary disease)    HLD (hyperlipidemia)    Primary central sleep apnea    Sleep apnea, obstructive    Cheyne-Stokes respiration       Current Medications:  Outpatient Medications Marked as Taking for the 03/06/22 encounter (Office Visit) with Zorita Pang, MD   Medication Sig Dispense Refill    aspirin EC 81 MG EC tablet Take 1 tablet (81 mg) by mouth daily      atorvastatin (LIPITOR) 40 MG tablet  Take 1 tablet (40 mg total) by mouth nightly 90 tablet 0    carvedilol (COREG) 25 MG tablet Take 1 tablet (25 mg total) by mouth 2 (two) times daily with meals 180 tablet 3    empagliflozin (JARDIANCE) 10 MG tablet Take 1 tablet (10 mg total) by mouth daily 90 tablet 3    fluticasone-salmeterol (ADVAIR DISKUS) 100-50 MCG/ACT Aerosol Pwdr, Breath Activated Inhale 1 puff into the lungs 2 (two) times daily 3 each 3    glucose blood (ONE TOUCH ULTRA TEST) test strip 1 each by Other route 2 (two) times daily Use as instructed 300 each 1    hydrocodone-chlorpheniramine (TUSSIONEX) 10-8 MG/5ML extended-release suspension Take 5 mLs by mouth 2 (two) times daily 115 mL 0    insulin glargine (LANTUS SOLOSTAR) 100 UNIT/ML injection pen Patient takes 50 units at night 45 mL 3    Insulin Pen Needle (BD Pen Needle Nano U/F) 32G X 4 MM Misc Use as directed bid 200 each 3    metOLazone (ZAROXOLYN) 2.5 MG  tablet Take 1 tablet (2.5 mg total) by mouth once a week 12 tablet 3    niacin (NIASPAN) 500 MG CR tablet TAKE 1 TABLET BY MOUTH TWICE DAILY 180 tablet 3    PARoxetine (Paxil) 20 MG tablet Take 1 tablet (20 mg total) by mouth every morning 30 tablet 11    sacubitril-valsartan (Entresto) 97-103 MG Tab per tablet Take 1 tablet by mouth 2 (two) times daily 180 tablet 3    Semglee, yfgn, 100 UNIT/ML Solution Pen-injector ADMINISTER 50 UNITS UNDER THE SKIN AT BEDTIME 45 mL 3    SITagliptin-metFORMIN (Janumet) 50-500 MG tablet Take 1 tablet by mouth 2 (two) times daily with meals 60 tablet 5    spironolactone (ALDACTONE) 25 MG tablet Take 0.5 tablets (12.5 mg total) by mouth daily 45 tablet 3    torsemide (Demadex) 20 MG tablet Take 2 tablets (40 mg) by mouth every morning AND 1 tablet (20 mg) every evening. 270 tablet 3       Allergies:  No Known Allergies    Past Medical History:  Past Medical History:   Diagnosis Date    Acute CHF     NOV  & DEC 2018, - DIALYSIS CATH PLACED Aug 24 2017 TO REMOVE FLUID     Acute systolic (congestive) heart failure 08/2017    Anxiety     CAD (coronary artery disease)     Cardiomyopathy     nonischemic    CHF (congestive heart failure) 08/12/2014, 2013    Chronic obstructive pulmonary disease     POSSIBLE PER PT HE USES  SYNBICORT BID ABD  BREO INHALER PRN    Coronary artery disease 2011    CORONARY STENT PLACEMENT FOLLOWED BY [facility]    Depression     echocardiogram 02/2016, 11/2016, 09/2017, 12/20/2017    GERD (gastroesophageal reflux disease)     Heart attack 2011    and 2014    Hyperlipemia     Hypertension     ICD (implantable 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     PLACED 2014  [facility]  LAST INTERROGATION April 01 2018 REPORT REQUESTED    Ischemic cardiomyopathy     EF 15% ON ECHO 09-20-2017 DR GARG IN [facility]    Nonischemic cardiomyopathy     NSTEMI (non-ST elevated myocardial infarction)     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    Pacemaker 2014    MEDTRONIC ICD/PACEMAKER COMBO LAST INTERROGATION   12-12 2018  Pneumonia 09/2016    Primary cardiomyopathy     Ischemic cardiomyopathy EF 15% ON ECHO 09-20-2017 DR GARG IN EPIC    Sepsis 08/2017    Syncope and collapse     PRIOR TO ICD/PACEMAKER PLACED 2014    Type 2 diabetes mellitus, controlled DX 1998    BS  AVG 100  A1C 7.4 Dec 27 2017    [facility]     PT SEE HIS PMD April 01 2018 RE THIS-TOLD TO SEE PMD BY [facility] JUNE 12 WHEN THEY SAW HIM FOR A CL       Past Surgical History:  Past Surgical History:   Procedure Laterality Date    BIV  11/10/2016    ICD METRONIC  UPGRADE     CARDIAC CATHETERIZATION  03/2010    CORONARY STENT PLACEMENT X 2 PER PT; LM normal, LAD 70-80% distal lesion at apex, 20% lesion mid CFX    CARDIAC CATHETERIZATION  12/2012    CARDIAC DEFIBRILLATOR PLACEMENT  12/2012    [facility]    CARDIAC PACEMAKER PLACEMENT  2014    MEDTRONIC ICD/PACEMAKER PLACED [facility]    CIRCUMCISION  AGE 64    COLONOSCOPY  2009    CORRECTION HAMMER TOE      duodenal ulcer  1973    STRESS RELATED IN SCHOOL    ECHOCARDIOGRAM, TRANSTHORACIC  02/2016 11/2016 09/2017 12/2017    ECHOCARDIOGRAM, TRANSTHORACIC  02/2016,11/2016,12/20/2017    EGD N/A 08/15/2014    Procedure: EGD;  Surgeon: Colon Branch, MD;  Location: [facility];  Service: Gastroenterology;  Laterality: N/A;  egd w/ bx    EGD  1975    EGD, COLONOSCOPY N/A 04/04/2018    Procedure: EGD with bxs, COLONOSCOPY with polypectomy and clipping;  Surgeon: Doyne Keel, MD;  Location: [facility];  Service: Gastroenterology;  Laterality: N/A;    FRACTURE SURGERY  AGE 89     RT  ANKLE- CAST APPLIED    HERNIA REPAIR  AGE 64    LEFT INGUINAL HERNIA    MPI nuclear study  06/2016    orthopedic surgery  AGE 26    RT foot corrective - HAMMERTOE    TONSILLECTOMY AND ADENOIDECTOMY  1954       Family History:  Family History   Problem Relation Age of Onset    Breast cancer Mother     Heart attack Mother 38    Hypertension Mother     Diabetes Mother     Coronary artery disease Mother     Diabetes Brother      Heart attack Father     Hypertension Father        Social History:  Social History     Socioeconomic History    Marital status: Married     Spouse name: Teacher, music    Number of children: 0   Occupational History    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, [place]    Tobacco Use    Smoking status: Never    Smokeless tobacco: Never   Vaping Use    Vaping status: Never Used   Substance and Sexual Activity    Alcohol use: No     Alcohol/week: 0.0 standard drinks of alcohol     Comment: occasionally    Drug use: No    Sexual activity: Yes     Partners: Female        The following sections were reviewed this encounter by the provider:   Tobacco  Allergies  Meds  Problems  Med Hx  Surg Hx  Fam Hx         Vitals:  BP 110/71 (BP Site: Left arm, Patient Position: Sitting, Cuff Size: Medium)   Pulse 97   Temp 98.2 F (36.8 C) (Temporal)   Resp 21   Wt 99.3 kg (219 lb)   SpO2 97%   BMI 32.07 kg/m     ROS:  Constitutional: Negative for fever, chills and malaise/fatigue.   Respiratory: Negative for cough, shortness of breath and wheezing.    Cardiovascular: Negative for chest pain and palpitations.   Gastrointestinal: Negative for abdominal pain, diarrhea and constipation.   Musculoskeletal: Negative for joint pain.   Skin: Negative for rash.   Neurological: Negative for dizziness and headaches.   Psychiatric/Behavioral: The patient does not have insomnia.         Objective:     Physical Exam:  Constitutional: Patient is oriented to person, place, and time. Appears well-developed and well-nourished.   HENT:   Head: Normocephalic and atraumatic.   Eyes: Conjunctivae and EOM are normal. Pupils are equal, round, and reactive to light.   Cardiovascular: Normal rate, regular rhythm and normal heart sounds.  Exam reveals no gallop and no friction rub.    No murmur heard.  Pulmonary/Chest: Effort normal and breath sounds normal. Has no wheezes.   Musculoskeletal: Normal range of motion. Exhibits no edema or tenderness.    Neurological: Patient is alert and oriented to person, place, and time.   Psychiatric: Patient has a normal mood and affect.        Assessment/Plan:       1. Scrotal swelling  - Ambulatory referral to Urology; Future    Time spent with patient including coordination of care took over 30 minutes.    Zorita Pang, MD

## 2022-03-06 NOTE — Progress Notes (Signed)
Have you seen any specialists since your last visit with us?  No      The patient was informed that the following HM items are still outstanding:   colonoscopy, shingles vaccine, and Tdap

## 2022-03-09 ENCOUNTER — Encounter (INDEPENDENT_AMBULATORY_CARE_PROVIDER_SITE_OTHER): Payer: Self-pay | Admitting: Urology

## 2022-03-09 ENCOUNTER — Ambulatory Visit (INDEPENDENT_AMBULATORY_CARE_PROVIDER_SITE_OTHER): Payer: Commercial Managed Care - POS | Admitting: Urology

## 2022-03-09 ENCOUNTER — Emergency Department: Payer: Commercial Managed Care - POS

## 2022-03-09 ENCOUNTER — Inpatient Hospital Stay
Admission: EM | Admit: 2022-03-09 | Discharge: 2022-03-20 | DRG: 291 | Disposition: A | Discharge status: Home / Self Care | Payer: Commercial Managed Care - POS | Attending: Internal Medicine | Admitting: Internal Medicine

## 2022-03-09 VITALS — BP 111/72 | HR 94 | Temp 97.3°F | Ht 72.0 in | Wt 226.0 lb

## 2022-03-09 DIAGNOSIS — D539 Nutritional anemia, unspecified: Secondary | ICD-10-CM | POA: Diagnosis present

## 2022-03-09 DIAGNOSIS — L03116 Cellulitis of left lower limb: Secondary | ICD-10-CM | POA: Diagnosis present

## 2022-03-09 DIAGNOSIS — F32A Depression, unspecified: Secondary | ICD-10-CM | POA: Diagnosis present

## 2022-03-09 DIAGNOSIS — N433 Hydrocele, unspecified: Secondary | ICD-10-CM | POA: Diagnosis present

## 2022-03-09 DIAGNOSIS — Z7951 Long term (current) use of inhaled steroids: Secondary | ICD-10-CM

## 2022-03-09 DIAGNOSIS — E875 Hyperkalemia: Secondary | ICD-10-CM | POA: Diagnosis present

## 2022-03-09 DIAGNOSIS — I272 Pulmonary hypertension, unspecified: Secondary | ICD-10-CM | POA: Diagnosis present

## 2022-03-09 DIAGNOSIS — I255 Ischemic cardiomyopathy: Secondary | ICD-10-CM | POA: Diagnosis present

## 2022-03-09 DIAGNOSIS — G4730 Sleep apnea, unspecified: Secondary | ICD-10-CM | POA: Diagnosis present

## 2022-03-09 DIAGNOSIS — I428 Other cardiomyopathies: Secondary | ICD-10-CM | POA: Diagnosis present

## 2022-03-09 DIAGNOSIS — I252 Old myocardial infarction: Secondary | ICD-10-CM

## 2022-03-09 DIAGNOSIS — I5043 Acute on chronic combined systolic (congestive) and diastolic (congestive) heart failure: Secondary | ICD-10-CM | POA: Diagnosis present

## 2022-03-09 DIAGNOSIS — I081 Rheumatic disorders of both mitral and tricuspid valves: Secondary | ICD-10-CM | POA: Diagnosis present

## 2022-03-09 DIAGNOSIS — N5089 Other specified disorders of the male genital organs: Secondary | ICD-10-CM

## 2022-03-09 DIAGNOSIS — N492 Inflammatory disorders of scrotum: Secondary | ICD-10-CM | POA: Diagnosis present

## 2022-03-09 DIAGNOSIS — E871 Hypo-osmolality and hyponatremia: Secondary | ICD-10-CM | POA: Diagnosis present

## 2022-03-09 DIAGNOSIS — J449 Chronic obstructive pulmonary disease, unspecified: Secondary | ICD-10-CM | POA: Diagnosis present

## 2022-03-09 DIAGNOSIS — C9110 Chronic lymphocytic leukemia of B-cell type not having achieved remission: Secondary | ICD-10-CM | POA: Diagnosis present

## 2022-03-09 DIAGNOSIS — Z95810 Presence of automatic (implantable) cardiac defibrillator: Secondary | ICD-10-CM

## 2022-03-09 DIAGNOSIS — Z79899 Other long term (current) drug therapy: Secondary | ICD-10-CM

## 2022-03-09 DIAGNOSIS — K5900 Constipation, unspecified: Secondary | ICD-10-CM | POA: Diagnosis present

## 2022-03-09 DIAGNOSIS — I2510 Atherosclerotic heart disease of native coronary artery without angina pectoris: Secondary | ICD-10-CM | POA: Diagnosis present

## 2022-03-09 DIAGNOSIS — L308 Other specified dermatitis: Secondary | ICD-10-CM | POA: Diagnosis present

## 2022-03-09 DIAGNOSIS — Z8711 Personal history of peptic ulcer disease: Secondary | ICD-10-CM

## 2022-03-09 DIAGNOSIS — Z7982 Long term (current) use of aspirin: Secondary | ICD-10-CM

## 2022-03-09 DIAGNOSIS — E7800 Pure hypercholesterolemia, unspecified: Secondary | ICD-10-CM | POA: Diagnosis present

## 2022-03-09 DIAGNOSIS — I25810 Atherosclerosis of coronary artery bypass graft(s) without angina pectoris: Secondary | ICD-10-CM

## 2022-03-09 DIAGNOSIS — Z6829 Body mass index (BMI) 29.0-29.9, adult: Secondary | ICD-10-CM

## 2022-03-09 DIAGNOSIS — F419 Anxiety disorder, unspecified: Secondary | ICD-10-CM | POA: Diagnosis present

## 2022-03-09 DIAGNOSIS — I509 Heart failure, unspecified: Principal | ICD-10-CM

## 2022-03-09 DIAGNOSIS — Z955 Presence of coronary angioplasty implant and graft: Secondary | ICD-10-CM

## 2022-03-09 DIAGNOSIS — I82411 Acute embolism and thrombosis of right femoral vein: Secondary | ICD-10-CM | POA: Diagnosis present

## 2022-03-09 DIAGNOSIS — I130 Hypertensive heart and chronic kidney disease with heart failure and stage 1 through stage 4 chronic kidney disease, or unspecified chronic kidney disease: Principal | ICD-10-CM | POA: Diagnosis not present

## 2022-03-09 DIAGNOSIS — D696 Thrombocytopenia, unspecified: Secondary | ICD-10-CM | POA: Diagnosis present

## 2022-03-09 DIAGNOSIS — Z7984 Long term (current) use of oral hypoglycemic drugs: Secondary | ICD-10-CM

## 2022-03-09 DIAGNOSIS — N183 Chronic kidney disease, stage 3 unspecified: Secondary | ICD-10-CM | POA: Diagnosis present

## 2022-03-09 DIAGNOSIS — I5022 Chronic systolic (congestive) heart failure: Secondary | ICD-10-CM

## 2022-03-09 DIAGNOSIS — L03314 Cellulitis of groin: Secondary | ICD-10-CM | POA: Diagnosis present

## 2022-03-09 DIAGNOSIS — R0602 Shortness of breath: Secondary | ICD-10-CM

## 2022-03-09 DIAGNOSIS — I472 Ventricular tachycardia, unspecified: Secondary | ICD-10-CM | POA: Diagnosis not present

## 2022-03-09 DIAGNOSIS — T502X5A Adverse effect of carbonic-anhydrase inhibitors, benzothiadiazides and other diuretics, initial encounter: Secondary | ICD-10-CM | POA: Diagnosis present

## 2022-03-09 DIAGNOSIS — E663 Overweight: Secondary | ICD-10-CM | POA: Diagnosis present

## 2022-03-09 DIAGNOSIS — Z794 Long term (current) use of insulin: Secondary | ICD-10-CM

## 2022-03-09 DIAGNOSIS — E1122 Type 2 diabetes mellitus with diabetic chronic kidney disease: Secondary | ICD-10-CM | POA: Diagnosis present

## 2022-03-09 DIAGNOSIS — R9431 Abnormal electrocardiogram [ECG] [EKG]: Secondary | ICD-10-CM

## 2022-03-09 LAB — MAN DIFF ONLY
Atypical Lymphocytes %: 5 %
Atypical Lymphocytes Absolute: 1.47 10*3/uL — ABNORMAL HIGH (ref 0.00–0.00)
Band Neutrophils Absolute: 0.88 10*3/uL (ref 0.00–1.00)
Band Neutrophils: 3 %
Basophils Absolute Manual: 0 10*3/uL (ref 0.00–0.08)
Basophils Manual: 0 %
Eosinophils Absolute Manual: 0.29 10*3/uL (ref 0.00–0.44)
Eosinophils Manual: 1 %
Lymphocytes Absolute Manual: 22.96 10*3/uL — ABNORMAL HIGH (ref 0.42–3.22)
Lymphocytes Manual: 78 %
Monocytes Absolute: 0.88 10*3/uL — ABNORMAL HIGH (ref 0.21–0.85)
Monocytes Manual: 3 %
Neutrophils Absolute Manual: 2.94 10*3/uL (ref 1.10–6.33)
Segmented Neutrophils: 10 %

## 2022-03-09 LAB — GLUCOSE WHOLE BLOOD - POCT
Whole Blood Glucose POCT: 80 mg/dL (ref 70–100)
Whole Blood Glucose POCT: 172 mg/dL — ABNORMAL HIGH (ref 70–100)

## 2022-03-09 LAB — COMPREHENSIVE METABOLIC PANEL
ALT: 10 U/L (ref 0–55)
AST (SGOT): 10 U/L (ref 5–41)
Albumin/Globulin Ratio: 1.1 (ref 0.9–2.2)
Albumin: 3.1 g/dL — ABNORMAL LOW (ref 3.5–5.0)
Alkaline Phosphatase: 74 U/L (ref 37–117)
Anion Gap: 9 (ref 5.0–15.0)
BUN: 32 mg/dL — ABNORMAL HIGH (ref 9.0–28.0)
Bilirubin, Total: 0.8 mg/dL (ref 0.2–1.2)
CO2: 23 mEq/L (ref 17–29)
Calcium: 8.5 mg/dL (ref 7.9–10.2)
Chloride: 107 mEq/L (ref 99–111)
Creatinine: 1.7 mg/dL — ABNORMAL HIGH (ref 0.5–1.5)
Globulin: 2.8 g/dL (ref 2.0–3.6)
Glucose: 102 mg/dL — ABNORMAL HIGH (ref 70–100)
Potassium: 4.1 mEq/L (ref 3.5–5.3)
Protein, Total: 5.9 g/dL — ABNORMAL LOW (ref 6.0–8.3)
Sodium: 139 mEq/L (ref 135–145)
eGFR: 41.2 mL/min/{1.73_m2} — AB (ref >=60)

## 2022-03-09 LAB — HIGH SENSITIVITY TROPONIN-I WITH DELTA
hs Troponin-I Delta: 1.3 ng/L
hs Troponin-I: 19.9 ng/L

## 2022-03-09 LAB — PROBNP: NT-proBNP: 4091 pg/mL — ABNORMAL HIGH (ref 0–450)

## 2022-03-09 LAB — URINALYSIS, REFLEX TO MICROSCOPIC EXAM IF INDICATED
Bilirubin, UA: NEGATIVE
Blood, UA: NEGATIVE
Glucose, UA: 50 — AB
Ketones UA: NEGATIVE
Leukocyte Esterase, UA: NEGATIVE
Nitrite, UA: NEGATIVE
Protein, UR: NEGATIVE
Specific Gravity UA: 1.006 (ref 1.001–1.035)
Urine pH: 6 (ref 5.0–8.0)
Urobilinogen, UA: NORMAL mg/dL (ref 0.2–2.0)

## 2022-03-09 LAB — CBC AND DIFFERENTIAL
Absolute NRBC: 0 10*3/uL (ref 0.00–0.00)
Hematocrit: 34.3 % — ABNORMAL LOW (ref 37.6–49.6)
Hgb: 11.2 g/dL — ABNORMAL LOW (ref 12.5–17.1)
Instrument Absolute Neutrophil Count: 1.88 10*3/uL (ref 1.10–6.33)
MCH: 34.5 pg — ABNORMAL HIGH (ref 25.1–33.5)
MCHC: 32.7 g/dL (ref 31.5–35.8)
MCV: 105.5 fL — ABNORMAL HIGH (ref 78.0–96.0)
MPV: 11.6 fL (ref 8.9–12.5)
Nucleated RBC: 0 /100 WBC (ref 0.0–0.0)
Platelets: 142 10*3/uL (ref 142–346)
RBC: 3.25 10*6/uL — ABNORMAL LOW (ref 4.20–5.90)
RDW: 15 % (ref 11–15)
WBC: 29.43 10*3/uL — ABNORMAL HIGH (ref 3.10–9.50)

## 2022-03-09 LAB — CELL MORPHOLOGY
Cell Morphology: ABNORMAL — AB
Platelet Estimate: NORMAL

## 2022-03-09 LAB — ECG 12-LEAD
P Axis: 53 degrees
P-R Interval: 144 ms
QRS Duration: 88 ms
QTC Calculation (Bezet): 472 ms
T Axis: 92 degrees
Ventricular Rate: 84 {beats}/min

## 2022-03-09 LAB — ETHANOL: Alcohol: NOT DETECTED mg/dL

## 2022-03-09 LAB — LIPASE: Lipase: 21 U/L (ref 8–78)

## 2022-03-09 LAB — HIGH SENSITIVITY TROPONIN-I: hs Troponin-I: 18.6 ng/L

## 2022-03-09 MED ORDER — FLUTICASONE FUROATE-VILANTEROL 100-25 MCG/ACT IN AEPB
1.0000 | INHALATION_SPRAY | Freq: Every morning | RESPIRATORY_TRACT | Status: DC
Start: 2022-03-10 — End: 2022-03-20
  Administered 2022-03-13 – 2022-03-19 (×7): 1 via RESPIRATORY_TRACT
  Filled 2022-03-09: qty 14

## 2022-03-09 MED ORDER — DEXTROSE 10 % IV BOLUS
12.5000 g | INTRAVENOUS | Status: DC | PRN
Start: 2022-03-09 — End: 2022-03-20

## 2022-03-09 MED ORDER — NIACIN ER (ANTIHYPERLIPIDEMIC) 500 MG PO TBCR
500.0000 mg | EXTENDED_RELEASE_TABLET | Freq: Two times a day (BID) | ORAL | Status: DC
Start: 2022-03-09 — End: 2022-03-20
  Administered 2022-03-09 – 2022-03-20 (×22): 500 mg via ORAL
  Filled 2022-03-09 (×27): qty 1
  Filled 2022-03-09: qty 4
  Filled 2022-03-09 (×4): qty 1

## 2022-03-09 MED ORDER — GLUCAGON 1 MG IJ SOLR (WRAP)
1.0000 mg | INTRAMUSCULAR | Status: DC | PRN
Start: 2022-03-09 — End: 2022-03-20

## 2022-03-09 MED ORDER — FUROSEMIDE 10 MG/ML IJ SOLN
40.0000 mg | Freq: Once | INTRAMUSCULAR | Status: DC
Start: 2022-03-09 — End: 2022-03-09
  Filled 2022-03-09: qty 4

## 2022-03-09 MED ORDER — ENOXAPARIN SODIUM 40 MG/0.4ML IJ SOSY
40.0000 mg | PREFILLED_SYRINGE | INTRAMUSCULAR | Status: DC
Start: 2022-03-09 — End: 2022-03-17
  Administered 2022-03-09 – 2022-03-17 (×9): 40 mg via SUBCUTANEOUS
  Filled 2022-03-09 (×9): qty 0.4

## 2022-03-09 MED ORDER — DEXTROSE 50 % IV SOLN
12.5000 g | INTRAVENOUS | Status: DC | PRN
Start: 2022-03-09 — End: 2022-03-20

## 2022-03-09 MED ORDER — CARVEDILOL 12.5 MG PO TABS
25.0000 mg | ORAL_TABLET | Freq: Two times a day (BID) | ORAL | Status: DC
Start: 2022-03-09 — End: 2022-03-12
  Administered 2022-03-09 – 2022-03-11 (×2): 25 mg via ORAL
  Filled 2022-03-09 (×3): qty 2

## 2022-03-09 MED ORDER — INSULIN LISPRO 100 UNIT/ML SOLN (WRAP)
1.0000 [IU] | Freq: Every evening | Status: DC
Start: 2022-03-09 — End: 2022-03-12
  Administered 2022-03-11: 2 [IU] via SUBCUTANEOUS
  Filled 2022-03-09: qty 6

## 2022-03-09 MED ORDER — SPIRONOLACTONE 25 MG PO TABS
12.5000 mg | ORAL_TABLET | Freq: Every day | ORAL | Status: DC
Start: 2022-03-09 — End: 2022-03-12
  Administered 2022-03-09: 12.5 mg via ORAL
  Filled 2022-03-09 (×2): qty 1

## 2022-03-09 MED ORDER — ACETAMINOPHEN 325 MG PO TABS
650.0000 mg | ORAL_TABLET | Freq: Four times a day (QID) | ORAL | Status: DC | PRN
Start: 2022-03-09 — End: 2022-03-16
  Administered 2022-03-09 – 2022-03-16 (×8): 650 mg via ORAL
  Filled 2022-03-09 (×8): qty 2

## 2022-03-09 MED ORDER — PAROXETINE HCL 20 MG PO TABS
20.0000 mg | ORAL_TABLET | Freq: Every morning | ORAL | Status: DC
Start: 2022-03-09 — End: 2022-03-20
  Administered 2022-03-09 – 2022-03-20 (×12): 20 mg via ORAL
  Filled 2022-03-09 (×12): qty 1

## 2022-03-09 MED ORDER — METOLAZONE 2.5 MG PO TABS
2.5000 mg | ORAL_TABLET | ORAL | Status: DC
Start: 2022-03-09 — End: 2022-03-12
  Administered 2022-03-09: 2.5 mg via ORAL
  Filled 2022-03-09: qty 1

## 2022-03-09 MED ORDER — SACUBITRIL-VALSARTAN 97-103 MG PO TABS
1.0000 | ORAL_TABLET | Freq: Two times a day (BID) | ORAL | Status: DC
Start: 2022-03-09 — End: 2022-03-12
  Administered 2022-03-09 – 2022-03-11 (×5): 1 via ORAL
  Filled 2022-03-09 (×7): qty 1

## 2022-03-09 MED ORDER — FUROSEMIDE 10 MG/ML IJ SOLN
80.0000 mg | Freq: Once | INTRAMUSCULAR | Status: AC
Start: 2022-03-09 — End: 2022-03-09
  Administered 2022-03-09: 80 mg via INTRAVENOUS
  Filled 2022-03-09: qty 8

## 2022-03-09 MED ORDER — FLUTICASONE FUROATE-VILANTEROL 100-25 MCG/ACT IN AEPB
1.0000 | INHALATION_SPRAY | Freq: Every morning | RESPIRATORY_TRACT | Status: DC
Start: 2022-03-09 — End: 2022-03-09

## 2022-03-09 MED ORDER — INSULIN LISPRO 100 UNIT/ML SOLN (WRAP)
1.0000 [IU] | Freq: Three times a day (TID) | Status: DC
Start: 2022-03-09 — End: 2022-03-12
  Administered 2022-03-10: 2 [IU] via SUBCUTANEOUS
  Administered 2022-03-10 – 2022-03-11 (×2): 1 [IU] via SUBCUTANEOUS
  Administered 2022-03-11 – 2022-03-12 (×2): 2 [IU] via SUBCUTANEOUS
  Administered 2022-03-12: 3 [IU] via SUBCUTANEOUS
  Filled 2022-03-09: qty 3
  Filled 2022-03-09 (×2): qty 6
  Filled 2022-03-09 (×2): qty 3
  Filled 2022-03-09: qty 9

## 2022-03-09 MED ORDER — ATORVASTATIN CALCIUM 20 MG PO TABS
40.0000 mg | ORAL_TABLET | Freq: Every evening | ORAL | Status: DC
Start: 2022-03-09 — End: 2022-03-12
  Administered 2022-03-09 – 2022-03-11 (×3): 40 mg via ORAL
  Filled 2022-03-09 (×4): qty 2

## 2022-03-09 MED ORDER — INSULIN GLARGINE 100 UNIT/ML SC SOLN
20.0000 [IU] | Freq: Every morning | SUBCUTANEOUS | Status: DC
Start: 2022-03-09 — End: 2022-03-15
  Administered 2022-03-09 – 2022-03-15 (×7): 20 [IU] via SUBCUTANEOUS
  Filled 2022-03-09 (×7): qty 20

## 2022-03-09 MED ORDER — ASPIRIN 81 MG PO TBEC
81.0000 mg | DELAYED_RELEASE_TABLET | Freq: Every day | ORAL | Status: DC
Start: 2022-03-10 — End: 2022-03-20
  Administered 2022-03-10 – 2022-03-20 (×11): 81 mg via ORAL
  Filled 2022-03-09 (×11): qty 1

## 2022-03-09 MED ORDER — FUROSEMIDE 10 MG/ML IJ SOLN
80.0000 mg | Freq: Two times a day (BID) | INTRAMUSCULAR | Status: DC
Start: 2022-03-10 — End: 2022-03-12
  Administered 2022-03-10 – 2022-03-11 (×4): 80 mg via INTRAVENOUS
  Filled 2022-03-09 (×4): qty 8

## 2022-03-09 MED ORDER — GLUCOSE 40 % PO GEL (WRAP)
15.0000 g | ORAL | Status: DC | PRN
Start: 2022-03-09 — End: 2022-03-20

## 2022-03-09 NOTE — ED to IP RN Note (Signed)
[facility]  ED NURSING NOTE FOR THE RECEIVING INPATIENT NURSE   ED NURSE Donna Bernard [id number]   ED CHARGE RN Willaim Rayas   ADMISSION INFORMATION   [name] [name] is a 76 y.o. male admitted with an ED diagnosis of:    1. Acute on chronic congestive heart failure, unspecified heart failure type    2. Shortness of breath    3. Scrotal swelling         Isolation: None   Allergies: Patient has no known allergies.   Holding Orders confirmed? No   Belongings Documented? No   Home medications sent to pharmacy confirmed? No   NURSING CARE   Patient Comes From:   Mental Status: Home Independent  alert and oriented   ADL: Needs assistance with ADLs   Ambulation: 1 person assist   Pertinent Information  and Safety Concerns:     [place] Violence Risk Level: Low Call RN     CT / NIH   CT Head ordered on this patient?  N/A   NIH/Dysphagia assessment done prior to admission? N/A   VITAL SIGNS (at the time of this note)      Vitals:    03/09/22 1442   BP: 121/73   Pulse: 83   Resp:    Temp:    SpO2: 99%

## 2022-03-09 NOTE — Progress Notes (Signed)
Subjective:   Patient ID: [name] [name] is a 76 y.o. male     Chief Complaint:  Scrotal edema    Patient comes in because of apparent left hydrocele.  He has had this for a few weeks and he has become more uncomfortable with it.  No fever or chills.  Seen in urgent care last week and by his family doctor on Friday and they did an ultrasound which indeed did show moderate left complicated hydrocele.  There was also edematous thickening of the scrotal wall.  On physical exam he has significant pan scrotal swelling and edema, as well as foreskin swelling.  This does embarrassed that he penis.  When he lays down this fluid does soften a bit and the penis can be delivered.  There is no evidence of any active infection in his scrotum or in his perineal area.  There is no drainage or discharge.  He does have bilateral lower extremity edema as well and he says he does have a cough that is new.  He does have significant heart disease and sees cardiology and has had some recent changes in medication.      The following portions of the patient's history were reviewed and updated as appropriate: allergies, current medications, past family history, past medical history, past social history, past surgical history and problem list.    Review of Systems  History obtained from the patient  General ROS: negative for - chills, fatigue, fever or weight loss  Psychological ROS: negative for - depression, disorientation or memory difficulties  Ophthalmic ROS: negative for - blurry vision or loss of vision  Hematological and Lymphatic ROS: negative for - bleeding problems, night sweats or swollen lymph nodes  Endocrine ROS: negative for - malaise/lethargy, polydipsia/polyuria or skin changes  Respiratory ROS: Describes a cough, no shortness of breath   Cardiovascular ROS: no chest pain or dyspnea on exertion  Gastrointestinal ROS: no abdominal pain, change in bowel habits, or black or bloody stools  Genito-Urinary ROS: Scrotal  swelling.  Difficulty retracting foreskin.  Musculoskeletal ROS: negative for - joint pain or muscle pain  Neurological ROS: negative for - headaches, impaired coordination/balance or numbness/tingling    Objective:   BP 111/72 (BP Site: Right arm, Patient Position: Sitting, Cuff Size: Medium)   Pulse 94   Temp 97.3 F (36.3 C) (Temporal)   Ht 1.829 m (6')   Wt 102.5 kg (226 lb)   BMI 30.65 kg/m   General appearance - alert, well appearing, and in no distress  Mental status - alert, oriented to person, place, and time, appropriate affect  Skin: Normal temperature, turgor and texture; no rash or ulcers  Neck - Supple, symmetrical, trachea midline, NROM, no adenopathy    Chest - Normal chest wall excursion; symmetric air entry without wheeze or rales  Heart - normal rate and regular rhythm  Abdomen - soft, nontender, nondistended, no masses or organomegaly  GU Male -the scrotum is massively swollen consistent with edema.  The testicles are not readily palpable but he is not tender.  Foreskin also edematous.  With the patient in the supine position the foreskin could be squeezed and the head of the penis delivered without difficulty.  Neurologic -  Normal strength, sensation and reflexes throughout  Musculoskeletal - no joint tenderness, deformity or swelling  Extremities -bilateral pitting edema below the knees.    Lab Review   Urine analysis shows     Radiology Review   Scrotal ultrasound  Assessment:  1. Scrotal swelling        Spoke to the patient about findings.  Although he does have a moderate hydrocele on the left, this is not the cause of his present problems.  He obviously has edema likely related to his history of heart disease.  I told him he needed to be seen in the emergency room.  He will likely need diuresis or other cardiac management.  I offered that he can come to [facility] and they could treat him but he would prefer to go to [facility] where he has his cardiologist present.   This is reasonable given that he is ambulatory and not in significant distress.  He does not need urologic management of his hydrocele at this time but once he is treated medically this can be reassessed.     Plan:   There are no Patient Instructions on file for this visit.    Orders  No orders of the defined types were placed in this encounter.

## 2022-03-09 NOTE — H&P (Signed)
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  Chest x-ray no acute cardiopulmonary process  Ultrasound of the scrotum and testicles with Doppler on 03/06/2022 showed moderate size complex left hydrocele with septation no hyperemia or mass scrotal wall edema without fluid collection  Last echocardiogram August 2022 showed EF of 25 to 30% improved from 15% from May 2021 nonischemic cardiomyopathy grade 3 diastolic dysfunction  29,000 white blood count  Status post Medtronic ICD in place  Admit to inpatient cardiac telemetry.  -- Continue with I's and O's and daily weight restrict, fluid restriction low-salt sodium diet,   -- Continue on monitor telemetry  --[facility] cardiology recommending IV Lasix 80 mg twice daily, monitor weight, volume status, patient currently on 40 mg of torsemide in the morning and 20 mg at bedtime  -- Patient needs to be followed by heart failure team [facility] would arrange for follow-ups  -- Obtain proBNP  -- Continue Coreg, Entresto, spironolactone  -- Patient followed by urology Dr. Kendall Flack as outpatient who sent the patient to emergency department no urology needed at this time for hydrocele.  Can be medically treated with CHF.      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  Blood sugar on arrival 102 seems to be well controlled  -- Hold oral medications in the hospital start patient on a long-acting insulin 80% of home dose and low-dose sliding scale 3 times daily and nightly.  -- Consistent carbohydrate diet    Leukocytosis of 29 K without any fever or signs of infection.  Chest x-ray without  any pneumonia.  Will obtain UA pending at this time.    Hypertension, currently on Entresto, Coreg, and spironolactone, metolazone and torsemide which will be replaced by IV Lasix during hospital stay.   -- Monitor blood pressure trend    CKD stage III, creatinine baseline 1.6-1.8.  Today creatinine of 1.7 BUN of 32 with GFR of 41 anion gap of 9 without any metabolic acidosis.  -- Continue BUN and creatinine monitoring due to IV diuresis avoid nephrotoxic medications    Coronary artery disease nonocclusive follows by [facility] as outpatient, currently on Coreg, Lipitor, aspirin    Anxiety and depression, currently on Paxil 20 mg daily.  Mood and affect are stable    COPD and sleep apnea, no signs of acute respiratory wheezing or shortness of breath cough, currently on Breo and DuoNebs as needed    Hyperlipidemia, currently on Lipitor        Hospital Problems:      Patient has BMI=Body mass index is 29.7 kg/m.  Diagnosis: Overweight based on BMI criteria    Recent Labs     03/09/22  1457  Platelets  142     Diagnosis: Mild Thrombocytopenia         Principal Problem:    Acute on chronic congestive heart failure, unspecified heart failure type      I discussed diagnosis, plan of care and treatment plan. Patient is in agreement of treatment plan as above    DVT Prophylaxis:lovenox and SEDs  Code Status: Full Code  Disposition:home  Type of Admission:Inpatient  Estimated Length of Stay (including stay in the ER receiving treatment): >2 days  Foley and Central Lines:   Medical Necessity for stay: Congestive heart failure acute on chronic systolic and diastolic.  With scrotal edema and weight gain  Milestones: As above needs to be cleared by cardiology, heart failure consultation    Clinical Presentation:   History of Presenting Illness:   [name] [name] is a 76 y.o. male who has history of   Past Surgical History:   Procedure Laterality Date    BIV  11/10/2016    ICD METRONIC  UPGRADE     CARDIAC  CATHETERIZATION  03/2010    CORONARY STENT PLACEMENT X 2 PER PT; LM normal, LAD 70-80% distal lesion at apex, 20% lesion mid CFX    CARDIAC CATHETERIZATION  12/2012    CARDIAC DEFIBRILLATOR PLACEMENT  12/2012    [facility]    CARDIAC PACEMAKER PLACEMENT  2014    MEDTRONIC ICD/PACEMAKER PLACED [facility]    CIRCUMCISION  AGE 49    COLONOSCOPY  2009    CORRECTION HAMMER TOE      duodenal ulcer  1973    STRESS RELATED IN SCHOOL    ECHOCARDIOGRAM, TRANSTHORACIC  02/2016 11/2016 09/2017 12/2017    ECHOCARDIOGRAM, TRANSTHORACIC  02/2016,11/2016,12/20/2017    EGD N/A 08/15/2014    Procedure: EGD;  Surgeon: Colon Branch, MD;  [facility]: [facility];  Service: Gastroenterology;  Laterality: N/A;  egd w/ bx    EGD  1975    EGD, COLONOSCOPY N/A 04/04/2018    Procedure: EGD with bxs, COLONOSCOPY with polypectomy and clipping;  Surgeon: Doyne Keel, MD;  Location: [facility];  Service: Gastroenterology;  Laterality: N/A;    FRACTURE SURGERY  AGE 65     RT  ANKLE- CAST APPLIED    HERNIA REPAIR  AGE 49    LEFT INGUINAL HERNIA    MPI nuclear study  06/2016    orthopedic surgery  AGE 44    RT foot corrective - HAMMERTOE    TONSILLECTOMY AND ADENOIDECTOMY  1954      Past Medical History:   Diagnosis Date    Acute CHF     NOV  & DEC 2018, - DIALYSIS CATH PLACED Aug 24 2017 TO REMOVE FLUID     Acute systolic (congestive) heart failure 08/2017    Anxiety     CAD (coronary artery disease)     Cardiomyopathy     nonischemic    CHF (congestive heart failure) 08/12/2014, 2013    Chronic obstructive pulmonary disease     POSSIBLE PER PT HE USES  SYNBICORT BID ABD  BREO INHALER PRN    Coronary artery disease 2011    CORONARY STENT PLACEMENT FOLLOWED BY [facility]    Depression     echocardiogram 02/2016, 11/2016, 09/2017, 12/20/2017    GERD (gastroesophageal reflux disease)     Heart attack 2011    and 2014    Hyperlipemia     Hypertension     ICD (implantable 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     PLACED 2014  [facility]  LAST  INTERROGATION April 01 2018 REPORT REQUESTED    Ischemic cardiomyopathy     EF 15% ON ECHO 09-20-2017 DR GARG IN EPIC    Nonischemic cardiomyopathy     NSTEMI (non-ST elevated myocardial infarction)     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    Pacemaker 2014  MEDTRONIC ICD/PACEMAKER COMBO LAST INTERROGATION  12-12 2018    Pneumonia 09/2016    Primary cardiomyopathy     Ischemic cardiomyopathy EF 15% ON ECHO 09-20-2017 DR GARG IN EPIC    Sepsis 08/2017    Syncope and collapse     PRIOR TO ICD/PACEMAKER PLACED 2014    Type 2 diabetes mellitus, controlled DX 1998    BS  AVG 100  A1C 7.4 Dec 27 2017    Wheeze     PT SEE HIS PMD April 01 2018 RE THIS-TOLD TO SEE PMD BY [facility] JUNE 12 WHEN THEY SAW HIM FOR A CL    came with the chief complaint of Shortness of Breath, Groin Swelling, and Leg Swelling    76 year old male with known history of systolic 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 Kendall Flack who sent the patient to emergency department he is currently on diuretics 40 mg of torsemide in the morning 20 mg in the evening and metolazone however he reported that he had started to gaining weight and not improving despite taking his diuretics.  He reports he is not smoking and not drinking alcohol he is compliant with the regimen including Entresto carteolol spironolactone  and multiple other medical therapy.  He does not need any urological  management of his hydrocele at this time needs to be medically treated for congestive heart failure therefore he was sent by urologist Dr. Molly Maduro her to emergency department.          Review of Systems:   A comprehensive review of systems was negative except for the underlying highlighted in Bold     Constitutional: chills, weight loss, malaise/fatigue and diaphoresis.   HENT: hearing loss, ear pain, nosebleeds, congestion, sore throat, neck pain, tinnitus and ear discharge.    Eyes: blurred vision, double vision, photophobia, pain, discharge and redness.   Respiratory: cough, hemoptysis, sputum production, shortness of breath, wheezing and stridor.    Cardiovascular: chest pain, palpitations, orthopnea, claudication, leg swelling and PND.   Gastrointestinal: heartburn, nausea, vomiting, abdominal pain, diarrhea, constipation, blood in stool and melena.   Genitourinary: dysuria, urgency, frequency, hematuria and flank pain.  Scrotal edema/painful to touch  Musculoskeletal: myalgias, back pain, joint pain and falls.   Skin: itching and rash.   Neurological: dizziness, tingling, tremors, sensory change, speech change, focal weakness, seizures, loss of consciousness, weakness and headaches.    Psychiatric/Behavioral: depression, suicidal ideas, hallucinations, memory loss and substance abuse. The patient is not nervous/anxious and does not have insomnia.      Physical Exam:     Vitals:    03/09/22 1412 03/09/22 1442 03/09/22 1601 03/09/22 1610   BP: 115/66 121/73 110/69 103/59   Pulse: 85 83 90 97   Resp:    22   Temp:       TempSrc:       SpO2: 100% 99% 100% 98%   Weight:       Height:           Constitutional: Patient is  oriented to person, place, and time. Patient appears well-developed and well-nourished.   Cardiovascular: AICD, regular rhythm, normal heart sounds and intact distal pulses.  Exam reveals no gallop and no friction rub.   Pulmonary/Chest: Effort normal and breath sounds normal. No stridor. No  respiratory distress. Patient has no wheezes. No crackles were present.  Exhibits no tenderness on palpation.   Abdominal: Distended but not tender bowel sounds are normal. Patient exhibits no distension and no mass was palpable.There is no rebound and no guarding.   Musculoskeletal: Normal range of motion. Patient exhibits no edema and no tenderness.   Neurological: Patient is alert and oriented to person, place, and time and has normal reflexes. No cranial nerve deficit.  Normal muscle tone. Coordination normal.   Skin: Skin is warm. No rash noted. Patient is not diaphoretic. No erythema. No pallor.  Scrotal edema  Psychiatric: Has normal mood and affect. Behavior is normal. Judgment and thought content normal    Physical Exam  Clinical Information and History:   Chief Complaint:  Chief Complaint   Patient presents with    Shortness of Breath    Groin Swelling    Leg Swelling     Past Medical History:  Past Medical History:   Diagnosis Date    Acute CHF     NOV  & DEC 2018, - DIALYSIS CATH PLACED Aug 24 2017 TO REMOVE FLUID     Acute systolic (congestive) heart failure 08/2017    Anxiety     CAD (coronary artery disease)     Cardiomyopathy     nonischemic    CHF (congestive heart failure) 08/12/2014, 2013    Chronic obstructive pulmonary disease     POSSIBLE PER PT HE USES  SYNBICORT BID ABD  BREO INHALER PRN    Coronary artery disease 2011    CORONARY STENT PLACEMENT FOLLOWED BY [facility]    Depression     echocardiogram 02/2016, 11/2016, 09/2017, 12/20/2017    GERD (gastroesophageal reflux disease)     Heart attack 2011    and 2014    Hyperlipemia     Hypertension     ICD (implantable 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     PLACED 2014  [facility]  LAST INTERROGATION April 01 2018 REPORT REQUESTED    Ischemic cardiomyopathy     EF 15% ON ECHO 09-20-2017 DR GARG IN EPIC    Nonischemic cardiomyopathy     NSTEMI (non-ST elevated myocardial infarction)     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    Pacemaker 2014    MEDTRONIC ICD/PACEMAKER  COMBO LAST INTERROGATION  12-12 2018    Pneumonia 09/2016    Primary cardiomyopathy     Ischemic cardiomyopathy EF 15% ON ECHO 09-20-2017 DR GARG IN EPIC    Sepsis 08/2017    Syncope and collapse     PRIOR TO ICD/PACEMAKER PLACED 2014    Type 2 diabetes mellitus, controlled DX 1998    BS  AVG 100  A1C 7.4 Dec 27 2017    Wheeze     PT SEE HIS PMD April 01 2018 RE THIS-TOLD TO SEE PMD BY [facility] JUNE 12 WHEN THEY SAW HIM FOR A CL     Past Surgical History:  Past Surgical History:   Procedure Laterality Date    BIV  11/10/2016    ICD METRONIC  UPGRADE     CARDIAC CATHETERIZATION  03/2010    CORONARY STENT PLACEMENT X 2 PER PT; LM normal, LAD 70-80% distal lesion at  apex, 20% lesion mid CFX    CARDIAC CATHETERIZATION  12/2012    CARDIAC DEFIBRILLATOR PLACEMENT  12/2012    [facility]    CARDIAC PACEMAKER PLACEMENT  2014    MEDTRONIC ICD/PACEMAKER PLACED [facility]    CIRCUMCISION  AGE 89    COLONOSCOPY  2009    CORRECTION HAMMER TOE      duodenal ulcer  1973    STRESS RELATED IN SCHOOL    ECHOCARDIOGRAM, TRANSTHORACIC  02/2016 11/2016 09/2017 12/2017    ECHOCARDIOGRAM, TRANSTHORACIC  02/2016,11/2016,12/20/2017    EGD N/A 08/15/2014    Procedure: EGD;  Surgeon: Colon Branch, MD;  [facility]: [facility];  Service: Gastroenterology;  Laterality: N/A;  egd w/ bx    EGD  1975    EGD, COLONOSCOPY N/A 04/04/2018    Procedure: EGD with bxs, COLONOSCOPY with polypectomy and clipping;  Surgeon: Doyne Keel, MD;  Location: [facility];  Service: Gastroenterology;  Laterality: N/A;    FRACTURE SURGERY  AGE 76     RT  ANKLE- CAST APPLIED    HERNIA REPAIR  AGE 89    LEFT INGUINAL HERNIA    MPI nuclear study  06/2016    orthopedic surgery  AGE 89    RT foot corrective - HAMMERTOE    TONSILLECTOMY AND ADENOIDECTOMY  1954     Family History:  Family History   Problem Relation Age of Onset    Breast cancer Mother     Heart attack Mother 40    Hypertension Mother     Diabetes Mother     Coronary artery disease Mother      Diabetes Brother     Heart attack Father     Hypertension Father      Social History:  Social History     Substance and Sexual Activity   Alcohol Use No    Alcohol/week: 0.0 standard drinks of alcohol    Comment: occasionally     Social History     Substance and Sexual Activity   Drug Use No     Social History     Tobacco Use   Smoking Status Never   Smokeless Tobacco Never   Vaping Use   Vaping Status Never Used     Social History     Socioeconomic History    Marital status: Married     Spouse name: Teacher, music    Number of children: 0    Years of education: None    Highest education level: None   Occupational History    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, [place]    Tobacco Use    Smoking status: Never    Smokeless tobacco: Never   Vaping Use    Vaping status: Never Used   Substance and Sexual Activity    Alcohol use: No     Alcohol/week: 0.0 standard drinks of alcohol     Comment: occasionally    Drug use: No    Sexual activity: Yes     Partners: Female   Other Topics Concern    None   Social History Narrative    None     Social Determinants of Health     Financial Resource Strain: Not on file   Food Insecurity: Not on file   Transportation Needs: Not on file   Physical Activity: Not on file   Stress: Not on file   Social Connections: Not on file   Intimate Partner Violence: Not on file   Housing  Stability: Not on file     Allergies:No Known Allergies  Medications:(Not in a hospital admission)    Results of Labs/imaging   Labs have been reviewed:   Coagulation Profile:       CBC review:   Recent Labs   Lab 03/09/22  1457   WBC 29.43*   Hgb 11.2*   Hematocrit 34.3*   Platelets 142   MCV 105.5*   RDW 15   Segmented Neutrophils 10     Chem Review:  Recent Labs   Lab 03/09/22  1457   Sodium 139   Potassium 4.1   Chloride 107   CO2 23   BUN 32.0*   Creatinine 1.7*   Glucose 102*   Calcium 8.5   Bilirubin, Total 0.8   AST (SGOT) 10   ALT 10   Alkaline Phosphatase 74     Results       Procedure Component Value Units Date/Time     NT-proBNP [[id number]]  (Abnormal) Collected: 03/09/22 1457     Updated: 03/09/22 1556     NT-proBNP 4,091 pg/mL     Manual Differential [[id number]]  (Abnormal) Collected: 03/09/22 1457     Updated: 03/09/22 1544     Segmented Neutrophils 10 %      Band Neutrophils 3 %      Lymphocytes Manual 78 %      Monocytes Manual 3 %      Eosinophils Manual 1 %      Basophils Manual 0 %      Atypical Lymphocytes % 5 %      Neutrophils Absolute Manual 2.94 x10 3/uL      Band Neutrophils Absolute 0.88 x10 3/uL      Lymphocytes Absolute Manual 22.96 x10 3/uL      Monocytes Absolute 0.88 x10 3/uL      Eosinophils Absolute Manual 0.29 x10 3/uL      Basophils Absolute Manual 0.00 x10 3/uL      Atypical Lymphocytes Absolute 1.47 x10 3/uL     Cell MorpHology [[id number]]  (Abnormal) Collected: 03/09/22 1457     Updated: 03/09/22 1544     Cell Morphology Abnormal     Platelet Estimate Normal     Macrocytic 2+    CBC and differential [[id number]]  (Abnormal) Collected: 03/09/22 1457    Specimen: Blood Updated: 03/09/22 1543     WBC 29.43 x10 3/uL      Hgb 11.2 g/dL      Hematocrit 62.9 %      Platelets 142 x10 3/uL      RBC 3.25 x10 6/uL      MCV 105.5 fL      MCH 34.5 pg      MCHC 32.7 g/dL      RDW 15 %      MPV 11.6 fL      Instrument Absolute Neutrophil Count 1.88 x10 3/uL      Nucleated RBC 0.0 /100 WBC      Absolute NRBC 0.00 x10 3/uL     High Sensitivity Troponin-I at 0 hrs [[id number]] Collected: 03/09/22 1457    Specimen: Blood Updated: 03/09/22 1529     hs Troponin-I 18.6 ng/L     Comprehensive metabolic panel [[id number]]  (Abnormal) Collected: 03/09/22 1457    Specimen: Blood Updated: 03/09/22 1525     Glucose 102 mg/dL      BUN 64.4 mg/dL      Creatinine 1.7 mg/dL      Sodium 034 mEq/L  Potassium 4.1 mEq/L      Chloride 107 mEq/L      CO2 23 mEq/L      Calcium 8.5 mg/dL      Protein, Total 5.9 g/dL      Albumin 3.1 g/dL      AST (SGOT) 10 U/L      ALT 10 U/L      Alkaline Phosphatase 74 U/L      Bilirubin, Total 0.8 mg/dL       Globulin 2.8 g/dL      Albumin/Globulin Ratio 1.1     Anion Gap 9.0     eGFR 41.2 mL/min/1.73 m2     Lipase [[id number]] Collected: 03/09/22 1457     Updated: 03/09/22 1525     Lipase 21 U/L     Ethanol (Alcohol) Level [[id number]] Collected: 03/09/22 1457     Updated: 03/09/22 1525     Alcohol NONE DETECTED mg/dL           Radiology reports have been reviewed:  Radiology Results (24 Hour)       Procedure Component Value Units Date/Time    Chest AP Portable [[id number]] Collected: 03/09/22 1301    Order Status: Completed Updated: 03/09/22 1305    Narrative:      HISTORY: Chest Pain     COMPARISON: Chest radiograph 02/17/2021    FINDINGS:   LINES AND TUBES: Stable left pacemaker with stable leads overlying the heart    CARDIAC SILHOUETTE: Normal size.     LUNGS/PLEURA: No consolidation or edema. No pleural effusion or pneumothorax.      Impression:        No acute cardiopulmonary process.    Legrand Pitts, MD  03/09/2022 1:03 PM          EKG: EKG reviewed   AV paced      Hospitalist   Signed by:   Diana Eves, NP  03/09/2022 4:45 PM  Time spent in evaluating and managing the care of the patient including face-to-face and non-face-to-face: > 90 minutes    *This note was generated by the Epic EMR system/ Dragon speech recognition and may contain inherent errors or omissions not intended by the user. Grammatical errors, random word insertions, deletions, pronoun errors and incomplete sentences are occasional consequences of this technology due to software limitations. Not all errors are caught or corrected. If there are questions or concerns about the content of this note or information contained within the body of this dictation they should be addressed directly with the author for clarification

## 2022-03-09 NOTE — Progress Notes (Signed)
Pt transferred from ED to room 5212. Pt alert and oriented x4. Vital signs taken. Skin assessment done with second RN. Patient is oriented to the room and nursing staff. Pt updated with plan of care. Pt verbalized understanding. Call bell and bedside table within reach. Safety and fall precautions initiated.

## 2022-03-09 NOTE — Transition of Care (Signed)
FOUR EYES SKIN ASSESSMENT NOTE    [name] [name]  08/22/1946  [id number]    Braden Scale Score: 22    POC Initiated for Risk for Altered Skin: No      Mepilex Dressing Applied to sacrum/heel if any PI risk factors present: No      If Wound / Pressure Injury Present:    Wound / PI Documented on Patient Avatar No      Wound / PI assessment documented in Flowsheet: No      Admitting Physician notified: No      Wound Consult ordered: No      Clifton Custard, RN  Mar 09, 2022  5:22 PM    Second RN Name: Jethro Bolus

## 2022-03-09 NOTE — Consults (Signed)
[facility] CARDIOLOGY CONSULTATION REPORT  [facility]    Date Time: 03/09/22 3:03 PM  Patient Name: [name] [name]  Requesting Physician: Ollen Barges, MD       Reason for Consultation:     CHF exacerbation      History:   Beverley Sherrard is a 76 y.o. male admitted on 03/09/2022.  We have been asked by Ollen Barges, MD,  to provide cardiac consultation, regarding CHF exacerbation.  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  Medtronic ICD in place.  Had been doing well but noted over the past 2 weeks he has been having progressive shortness of breath, edema, and orthopnea.  In addition he has developed severe scrotal edema.  Was seen today by urology and was sent in for diuresis.  Patient reports he is taking his medications without difficulty.  Takes his diuretics including torsemide 40 mg in the morning and 20 mg in the evening.  In the past he has been on torsemide 40 mg twice daily with metolazone once a week.  Denies sodium indiscretion, chest pain, chest comfort, or major med Tatian changes.  On guideline directed medical therapy including carvedilol, high dose and stroke, spironolactone, and Jardiance.    Past Medical History:     Past Medical History:   Diagnosis Date    Acute CHF     NOV  & DEC 2018, - DIALYSIS CATH PLACED Aug 24 2017 TO REMOVE FLUID     Acute systolic (congestive) heart failure 08/2017    Anxiety     CAD (coronary artery disease)     Cardiomyopathy     nonischemic    CHF (congestive heart failure) 08/12/2014, 2013    Chronic obstructive pulmonary disease     POSSIBLE PER PT HE USES  SYNBICORT BID ABD  BREO INHALER PRN    Coronary artery disease 2011    CORONARY STENT PLACEMENT FOLLOWED BY [facility]    Depression     echocardiogram 02/2016, 11/2016, 09/2017, 12/20/2017    GERD (gastroesophageal reflux disease)     Heart attack 2011    and 2014    Hyperlipemia     Hypertension     ICD (implantable  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     PLACED 2014  [facility]  LAST INTERROGATION April 01 2018 REPORT REQUESTED    Ischemic cardiomyopathy     EF 15% ON ECHO 09-20-2017 DR GARG IN EPIC    Nonischemic cardiomyopathy     NSTEMI (non-ST elevated myocardial infarction)     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    Pacemaker 2014    MEDTRONIC ICD/PACEMAKER COMBO LAST INTERROGATION  12-12 2018    Pneumonia 09/2016    Primary cardiomyopathy     Ischemic cardiomyopathy EF 15% ON ECHO 09-20-2017 DR GARG IN EPIC    Sepsis 08/2017    Syncope and collapse     PRIOR TO ICD/PACEMAKER PLACED 2014    Type 2 diabetes mellitus, controlled DX 1998    BS  AVG 100  A1C 7.4 Dec 27 2017    [place]     PT SEE HIS PMD April 01 2018 RE THIS-TOLD TO SEE PMD BY [facility] JUNE 12 WHEN THEY SAW HIM FOR A CL       Past Surgical History:     Past Surgical History:   Procedure Laterality Date    BIV  11/10/2016    ICD METRONIC  UPGRADE  CARDIAC CATHETERIZATION  03/2010    CORONARY STENT PLACEMENT X 2 PER PT; LM normal, LAD 70-80% distal lesion at apex, 20% lesion mid CFX    CARDIAC CATHETERIZATION  12/2012    CARDIAC DEFIBRILLATOR PLACEMENT  12/2012    [facility]    CARDIAC PACEMAKER PLACEMENT  2014    MEDTRONIC ICD/PACEMAKER PLACED [facility]    CIRCUMCISION  AGE 89    COLONOSCOPY  2009    CORRECTION HAMMER TOE      duodenal ulcer  1973    STRESS RELATED IN SCHOOL    ECHOCARDIOGRAM, TRANSTHORACIC  02/2016 11/2016 09/2017 12/2017    ECHOCARDIOGRAM, TRANSTHORACIC  02/2016,11/2016,12/20/2017    EGD N/A 08/15/2014    Procedure: EGD;  Surgeon: Colon Branch, MD;  Location: [facility];  Service: Gastroenterology;  Laterality: N/A;  egd w/ bx    EGD  1975    EGD, COLONOSCOPY N/A 04/04/2018    Procedure: EGD with bxs, COLONOSCOPY with polypectomy and clipping;  Surgeon: Doyne Keel, MD;  Location: [facility];  Service: Gastroenterology;  Laterality: N/A;    FRACTURE SURGERY  AGE 30     RT  ANKLE- CAST APPLIED    HERNIA REPAIR  AGE 89    LEFT INGUINAL  HERNIA    MPI nuclear study  06/2016    orthopedic surgery  AGE 18    RT foot corrective - HAMMERTOE    TONSILLECTOMY AND ADENOIDECTOMY  1954       Family History:     Family History   Problem Relation Age of Onset    Breast cancer Mother     Heart attack Mother 90    Hypertension Mother     Diabetes Mother     Coronary artery disease Mother     Diabetes Brother     Heart attack Father     Hypertension Father        Social History:     Social History     Socioeconomic History    Marital status: Married     Spouse name: Teacher, music    Number of children: 0    Years of education: Not on file    Highest education level: Not on file   Occupational History    Occupation: Runner, 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, [place]    Tobacco Use    Smoking status: Never    Smokeless tobacco: Never   Vaping Use    Vaping status: Never Used   Substance and Sexual Activity    Alcohol use: No     Alcohol/week: 0.0 standard drinks of alcohol     Comment: occasionally    Drug use: No    Sexual activity: Yes     Partners: Female   Other Topics Concern    Not on file   Social History Narrative    Not on file     Social Determinants of Health     Financial Resource Strain: Not on file   Food Insecurity: Not on file   Transportation Needs: Not on file   Physical Activity: Not on file   Stress: Not on file   Social Connections: Not on file   Intimate Partner Violence: Not on file   Housing Stability: Not on file       Allergies:   No Known Allergies    Medications:   (Not in a hospital admission)        Current Facility-Administered Medications   Medication Dose Route Frequency Provider Last  Rate Last Admin    furosemide (LASIX) injection 80 mg  80 mg Intravenous Once Ollen Barges, MD         Current Outpatient Medications   Medication Sig Dispense Refill    aspirin EC 81 MG EC tablet Take 1 tablet (81 mg) by mouth daily      atorvastatin (LIPITOR) 40 MG tablet Take 1 tablet (40 mg total) by mouth nightly 90 tablet 0    carvedilol (COREG) 25 MG tablet Take 1 tablet  (25 mg total) by mouth 2 (two) times daily with meals 180 tablet 3    empagliflozin (JARDIANCE) 10 MG tablet Take 1 tablet (10 mg total) by mouth daily 90 tablet 3    fluticasone-salmeterol (ADVAIR DISKUS) 100-50 MCG/ACT Aerosol Pwdr, Breath Activated Inhale 1 puff into the lungs 2 (two) times daily 3 each 3    glucose blood (ONE TOUCH ULTRA TEST) test strip 1 each by Other route 2 (two) times daily Use as instructed 300 each 1    hydrocodone-chlorpheniramine (TUSSIONEX) 10-8 MG/5ML extended-release suspension Take 5 mLs by mouth 2 (two) times daily 115 mL 0    insulin glargine (LANTUS SOLOSTAR) 100 UNIT/ML injection pen Patient takes 50 units at night 45 mL 3    Insulin Pen Needle (BD Pen Needle Nano U/F) 32G X 4 MM Misc Use as directed bid 200 each 3    metOLazone (ZAROXOLYN) 2.5 MG tablet Take 1 tablet (2.5 mg total) by mouth once a week 12 tablet 3    niacin (NIASPAN) 500 MG CR tablet TAKE 1 TABLET BY MOUTH TWICE DAILY 180 tablet 3    PARoxetine (Paxil) 20 MG tablet Take 1 tablet (20 mg total) by mouth every morning 30 tablet 11    sacubitril-valsartan (Entresto) 97-103 MG Tab per tablet Take 1 tablet by mouth 2 (two) times daily 180 tablet 3    Semglee, yfgn, 100 UNIT/ML Solution Pen-injector ADMINISTER 50 UNITS UNDER THE SKIN AT BEDTIME 45 mL 3    SITagliptin-metFORMIN (Janumet) 50-500 MG tablet Take 1 tablet by mouth 2 (two) times daily with meals 60 tablet 5    spironolactone (ALDACTONE) 25 MG tablet Take 0.5 tablets (12.5 mg total) by mouth daily 45 tablet 3    torsemide (Demadex) 20 MG tablet Take 2 tablets (40 mg) by mouth every morning AND 1 tablet (20 mg) every evening. 270 tablet 3         Review of Systems:    Comprehensive review of systems including constitutional, eyes, ears, nose, mouth, throat, cardiovascular, GI, GU, musculoskeletal, integumentary, respiratory, neurologic, psychiatric, and endocrine is negative other than what is mentioned already in the history of present illness    Physical  Exam:     Vitals:    03/09/22 1354   BP: 112/70   Pulse:    Resp:    Temp:    SpO2: 98%     Temp (24hrs), Avg:97.5 F (36.4 C), Min:97.3 F (36.3 C), Max:97.6 F (36.4 C)      Intake and Output Summary (Last 24 hours) at Date Time  No intake or output data in the 24 hours ending 03/09/22 1503    GENERAL: Patient is in no acute distress   HEENT: No scleral icterus or conjunctival pallor, moist mucous membranes   NECK: No jugular venous distention or thyromegaly, normal carotid upstrokes without bruits   CARDIAC: Normal apical impulse, regular rate and rhythm, with normal S1 and S2, and no murmurs, rubs, or gallops  CHEST: Clear to auscultation bilaterally, normal respiratory effort  ABDOMEN: Distended, nontender  EXTREMITIES: 2+ pitting edema  SKIN: No rash or jaundice   NEUROLOGIC: Alert and oriented to time, place and person, normal mood and affect  MUSCULOSKELETAL: Normal muscle strength and tone.      Labs Reviewed:                                         Radiology   Radiological Procedure reviewed.      chest X-ray  Assessment:   Acute on chronic systolic CHF with last ejection fraction being 25 to 30% by echo August 2022.  Moderate tricuspid and mitral regurgitation present.  PA pressures were 54 mmHg.  Exact trigger uncertain although he does note he was drinking a lot of fluids recently.  Scrotal edema, likely due to CHF  Nonischemic cardiomyopathy with EF as low as 15% May 2021, now up to 25 to 30% by echo August 2022  Negative cardiac catheterization for coronary disease 2014  Medtronic ICD in place  Diabetes  History of hypertension  Prior duodenal ulcer    Recommendations:   Diurese with IV Lasix.  In the past he has required 80 mg IV twice daily.  Please continue his cardiomyopathy regimen including Entresto, Jardiance, carvedilol, spironolactone.  We will notify heart failure service of patient admission            Signed by: Keitha ButteSubash B Kaid Seeberger, MD, MD      [facility]  NP Spectralink [phone]  (8am-5pm)  MD Spectralink [phone] (8am-5pm)  After hours, non urgent consult line [phone]  After Hours, urgent consults [phone]

## 2022-03-09 NOTE — Plan of Care (Signed)
Problem: Day of Admission - Heart Failure  Goal: Heart Failure Admission  Outcome: Progressing  Flowsheets (Taken 03/09/2022 1834)  Heart Failure Admission:   Standing Weight on admission, if unable to stand zero the bed and use the bed scale   Vital signs and telemetry per policy   Assess for swelling/edema and document   Oxygen as needed   Fluid restriction   Strict Intake/Output   Initiate education with patient and caregiver using CHF Warning Zones and Educational Videos ([employer] or [employer])     Problem: Everyday - Heart Failure  Goal: Stable Vital Signs and Fluid Balance  Outcome: Progressing  Flowsheets (Taken 03/09/2022 1834)  Stable Vital Signs and Fluid Balance:   Daily Standing Weights in the morning using the same scale, after using the bathroom and before breadfast.  If unable to stand, zero the bed and use the bed scale   Assess for swelling/edema   Wean oxygen as needed if appropriate   Monitor labs and report abnormalities to physician   Monitor, assess vital signs and telemetry per policy   Fluid Restriction   Strict Intake/Output  Goal: Mobility/Activity is Maintained at Optimal Level for Patient  Outcome: Progressing  Flowsheets (Taken 03/09/2022 1834)  Mobility/Activity is Maintained at Optimal Level for Patient:   Increase mobility as tolerated/progressive mobility protocol   Reposition patient every 2 hours and as needed unless able to reposition self   Assess for changes in respiratory status, level of consciousness and/or development of fatigue   Consult with Physical Therapy and/or Occupational Therapy   Perform active/passive ROM  Goal: Nutritional Intake is Adequate  Outcome: Progressing  Flowsheets (Taken 03/09/2022 1834)  Nutritional Intake is Adequate:   Cardiac diet-2 gm Sodium   Fluid Restricction if needed   Consult/Collaborate with Nutritionist   Assess appetite,anorexia and amount of meal/food tolerated   Patient and family teaching on low sodium diet   Encourage/perform oral  hygiene as appropriate  Goal: Teaching-Using CHF Warning Zones and Educational Videos  Outcome: Progressing  Flowsheets (Taken 03/09/2022 1834)  Teaching-Using CHF Warning Zones and Educational Videos:   Daily Standing Weights & record   Signs & Symptoms of CHF   CHF Warning Zones and when to call for help   Fluid Restriction if appropriate   Sodium Restriction   Medications   Document in the Education Tab in EPIC with Teach-back     Problem: Day of Discharge - Heart Failure  Goal: Discharge Education  Outcome: Progressing  Flowsheets (Taken 03/09/2022 1834)  Day of Discharge:   After Visit Summary (Discharge Instuctions) with medications   CHF Warning Zones and when to call for help   Daily Standing Weights   Fluid Restriction   Low Sodium Diet   Follow-up appointments     Problem: Safety  Goal: Patient will be free from injury during hospitalization  Outcome: Progressing  Flowsheets (Taken 03/09/2022 1834)  Patient will be free from injury during hospitalization:   Assess patient's risk for falls and implement fall prevention plan of care per policy   Provide and maintain safe environment   Ensure appropriate safety devices are available at the bedside   Use appropriate transfer methods   Include patient/ family/ care giver in decisions related to safety   Hourly rounding   Assess for patients risk for elopement and implement Elopement Risk Plan per policy   Provide alternative method of communication if needed (communication boards, writing)  Goal: Patient will be free from infection during hospitalization  Outcome: Progressing  Flowsheets (Taken 03/09/2022 1834)  Free from Infection during hospitalization:   Assess and monitor for signs and symptoms of infection   Monitor lab/diagnostic results   Monitor all insertion sites (i.e. indwelling lines, tubes, urinary catheters, and drains)   Encourage patient and family to use good hand hygiene technique     Problem: Pain  Goal: Pain at adequate level as identified by  patient  Outcome: Progressing  Flowsheets (Taken 03/09/2022 1834)  Pain at adequate level as identified by patient:   Identify patient comfort function goal   Assess for risk of opioid induced respiratory depression, including snoring/sleep apnea. Alert healthcare team of risk factors identified.   Assess pain on admission, during daily assessment and/or before any "as needed" intervention(s)   Reassess pain within 30-60 minutes of any procedure/intervention, per Pain Assessment, Intervention, Reassessment (AIR) Cycle   Evaluate if patient comfort function goal is met   Evaluate patient's satisfaction with pain management progress   Offer non-pharmacological pain management interventions   Consult/collaborate with Pain Service   Consult/collaborate with Physical Therapy, Occupational Therapy, and/or Speech Therapy   Include patient/patient care companion in decisions related to pain management as needed     Problem: Side Effects from Pain Analgesia  Goal: Patient will experience minimal side effects of analgesic therapy  Outcome: Progressing  Flowsheets (Taken 03/09/2022 1834)  Patient will experience minimal side effects of analgesic therapy:   Monitor/assess patient's respiratory status (RR depth, effort, breath sounds)   Assess for changes in cognitive function   Prevent/manage side effects per LIP orders (i.e. nausea, vomiting, pruritus, constipation, urinary retention, etc.)   Evaluate for opioid-induced sedation with appropriate assessment tool (i.e. POSS)     Problem: Discharge Barriers  Goal: Patient will be discharged home or other facility with appropriate resources  Outcome: Progressing  Flowsheets (Taken 03/09/2022 1834)  Discharge to home or other facility with appropriate resources:   Provide appropriate patient education   Provide information on available health resources   Initiate discharge planning     Problem: Psychosocial and Spiritual Needs  Goal: Demonstrates ability to cope with  hospitalization/illness  Outcome: Progressing  Flowsheets (Taken 03/09/2022 1834)  Demonstrates ability to cope with hospitalizations/illness:   Encourage verbalization of feelings/concerns/expectations   Provide quiet environment   Encourage patient to set small goals for self   Assist patient to identify own strengths and abilities   Encourage participation in diversional activity   Reinforce positive adaptation of new coping behaviors   Communicate referral to spiritual care as appropriate   Include patient/ patient care companion in decisions

## 2022-03-09 NOTE — ED Provider Notes (Signed)
EMERGENCY DEPARTMENT NOTE    None        HISTORY OF PRESENT ILLNESS   Translator Used: No    Chief Complaint: Shortness of Breath, Groin Swelling, and Leg Swelling       76 y.o. male with h/o cad, cardiomyopathy presents with worsening leg and scrotal swelling for the past 10 days with associated shortness of breath with minimal exertion. States can only walk 4-5 steps before feeling short of breath and has to rest.  Denies any chest pain.  Reports he occasionally misses his dose of torsemide.  He does not weigh himself regularly and is not aware whether he has gained weight.  He went to his primary care doctor who referred him to his urologist was told that his scrotal swelling was related to fluid accumulation rather than a scrotal problem.        MEDICAL HISTORY     Past Medical History:  Past Medical History:   Diagnosis Date    Acute CHF     NOV  & DEC 2018, - DIALYSIS CATH PLACED Aug 24 2017 TO REMOVE FLUID     Acute systolic (congestive) heart failure 08/2017    Anxiety     CAD (coronary artery disease)     Cardiomyopathy     nonischemic    CHF (congestive heart failure) 08/12/2014, 2013    Chronic obstructive pulmonary disease     POSSIBLE PER PT HE USES  SYNBICORT BID ABD  BREO INHALER PRN    Coronary artery disease 2011    CORONARY STENT PLACEMENT FOLLOWED BY [facility]    Depression     echocardiogram 02/2016, 11/2016, 09/2017, 12/20/2017    GERD (gastroesophageal reflux disease)     Heart attack 2011    and 2014    Hyperlipemia     Hypertension     ICD (implantable 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     PLACED 2014  [facility]  LAST INTERROGATION April 01 2018 REPORT REQUESTED    Ischemic cardiomyopathy     EF 15% ON ECHO 09-20-2017 DR GARG IN EPIC    Nonischemic cardiomyopathy     NSTEMI (non-ST elevated myocardial infarction)     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    Pacemaker 2014    MEDTRONIC ICD/PACEMAKER COMBO LAST INTERROGATION  12-12 2018    Pneumonia 09/2016    Primary cardiomyopathy     Ischemic cardiomyopathy EF  15% ON ECHO 09-20-2017 DR GARG IN EPIC    Sepsis 08/2017    Syncope and collapse     PRIOR TO ICD/PACEMAKER PLACED 2014    Type 2 diabetes mellitus, controlled DX 1998    BS  AVG 100  A1C 7.4 Dec 27 2017    Wheeze     PT SEE HIS PMD April 01 2018 RE THIS-TOLD TO SEE PMD BY [facility] JUNE 12 WHEN THEY SAW HIM FOR A CL       Past Surgical History:  Past Surgical History:   Procedure Laterality Date    BIV  11/10/2016    ICD METRONIC  UPGRADE     CARDIAC CATHETERIZATION  03/2010    CORONARY STENT PLACEMENT X 2 PER PT; LM normal, LAD 70-80% distal lesion at apex, 20% lesion mid CFX    CARDIAC CATHETERIZATION  12/2012    CARDIAC DEFIBRILLATOR PLACEMENT  12/2012    [facility]    CARDIAC PACEMAKER PLACEMENT  2014    MEDTRONIC ICD/PACEMAKER PLACED [facility]    CIRCUMCISION  AGE  9    COLONOSCOPY  2009    CORRECTION HAMMER TOE      duodenal ulcer  1973    STRESS RELATED IN SCHOOL    ECHOCARDIOGRAM, TRANSTHORACIC  02/2016 11/2016 09/2017 12/2017    ECHOCARDIOGRAM, TRANSTHORACIC  02/2016,11/2016,12/20/2017    EGD N/A 08/15/2014    Procedure: EGD;  Surgeon: Colon Branch, MD;  Location: [facility];  Service: Gastroenterology;  Laterality: N/A;  egd w/ bx    EGD  1975    EGD, COLONOSCOPY N/A 04/04/2018    Procedure: EGD with bxs, COLONOSCOPY with polypectomy and clipping;  Surgeon: Doyne Keel, MD;  Location: [facility];  Service: Gastroenterology;  Laterality: N/A;    FRACTURE SURGERY  AGE 25     RT  ANKLE- CAST APPLIED    HERNIA REPAIR  AGE 46    LEFT INGUINAL HERNIA    MPI nuclear study  06/2016    orthopedic surgery  AGE 65    RT foot corrective - HAMMERTOE    TONSILLECTOMY AND ADENOIDECTOMY  1954       Social History:  Social History     Socioeconomic History    Marital status: Married     Spouse name: Teacher, music    Number of children: 0   Occupational History    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, [place]    Tobacco Use    Smoking status: Never    Smokeless tobacco: Never   Vaping Use    Vaping status: Never Used    Substance and Sexual Activity    Alcohol use: No     Alcohol/week: 0.0 standard drinks of alcohol     Comment: occasionally    Drug use: No    Sexual activity: Yes     Partners: Female       Family History:  Family History   Problem Relation Age of Onset    Breast cancer Mother     Heart attack Mother 62    Hypertension Mother     Diabetes Mother     Coronary artery disease Mother     Diabetes Brother     Heart attack Father     Hypertension Father          PHYSICAL EXAM     ED Triage Vitals [03/09/22 1224]   Enc Vitals Group      BP 128/59      Heart Rate 88      Resp Rate 16      Temp 97.6 F (36.4 C)      Temp Source Temporal      SpO2 98 %      Weight 99.3 kg      Height 1.829 m      Head Circumference       Peak Flow       Pain Score 4      Pain Loc       Pain Edu?       Excl. in GC?          Nursing note and vitals reviewed.  Constitutional:  Well developed, well nourished.   Head:  Atraumatic. Normocephalic.    Eyes:  Normal Sclera. EOMI.   ENT:  Patent airway.  Neck:  Supple. Normal ROM.   Cardiovascular:  Regular rate. Regular rhythm. No murmurs, rubs, or gallops.  Pulmonary/Chest: tachypneic at rest. Mild crackles to lower lung bases.   Abdominal:  Soft and non-distended. There is no tenderness. No rebound, guarding,  or rigidity.   GU: scrotal edema. Minimal redness to R groin fold.   Back:   No CVAT. Nontender.   Extremities:  bilateral LE pitting edema. Normal range of motion.   Skin:  Skin is warm and dry.  No diaphoresis.    Neurological:  Alert, awake, and appropriate. Normal speech. Motor normal.  Psychiatric:  Good eye contact. Normal interaction, affect, and behavior.      MEDICAL DECISION MAKING     EMERGENCY DEPT. MEDICATIONS      ED Medication Orders (From admission, onward)      Start Ordered     Status Ordering Provider    03/09/22 1454 03/09/22 1453  furosemide (LASIX) injection 80 mg  Once        Route: Intravenous  Ordered Dose: 80 mg       Last MAR action: Given Dominiqua Cooner MEI     03/09/22 1356 03/09/22 1355    Once        Route: Intravenous  Ordered Dose: 40 mg       Discontinued Rea Reser MEI            LABORATORY RESULTS    Ordered and independently interpreted AVAILABLE laboratory tests.   Results       Procedure Component Value Units Date/Time    Manual Differential [[id number]]  (Abnormal) Collected: 03/09/22 1457     Updated: 03/09/22 1544     Segmented Neutrophils 10 %      Band Neutrophils 3 %      Lymphocytes Manual 78 %      Monocytes Manual 3 %      Eosinophils Manual 1 %      Basophils Manual 0 %      Atypical Lymphocytes % 5 %      Neutrophils Absolute Manual 2.94 x10 3/uL      Band Neutrophils Absolute 0.88 x10 3/uL      Lymphocytes Absolute Manual 22.96 x10 3/uL      Monocytes Absolute 0.88 x10 3/uL      Eosinophils Absolute Manual 0.29 x10 3/uL      Basophils Absolute Manual 0.00 x10 3/uL      Atypical Lymphocytes Absolute 1.47 x10 3/uL     Cell MorpHology [[id number]]  (Abnormal) Collected: 03/09/22 1457     Updated: 03/09/22 1544     Cell Morphology Abnormal     Platelet Estimate Normal     Macrocytic 2+    CBC and differential [[id number]]  (Abnormal) Collected: 03/09/22 1457    Specimen: Blood Updated: 03/09/22 1543     WBC 29.43 x10 3/uL      Hgb 11.2 g/dL      Hematocrit 29.5 %      Platelets 142 x10 3/uL      RBC 3.25 x10 6/uL      MCV 105.5 fL      MCH 34.5 pg      MCHC 32.7 g/dL      RDW 15 %      MPV 11.6 fL      Instrument Absolute Neutrophil Count 1.88 x10 3/uL      Nucleated RBC 0.0 /100 WBC      Absolute NRBC 0.00 x10 3/uL     High Sensitivity Troponin-I at 0 hrs [[id number]] Collected: 03/09/22 1457    Specimen: Blood Updated: 03/09/22 1529     hs Troponin-I 18.6 ng/L     Comprehensive metabolic panel [[id number]]  (Abnormal) Collected: 03/09/22 1457  Specimen: Blood Updated: 03/09/22 1525     Glucose 102 mg/dL      BUN 32.2 mg/dL      Creatinine 1.7 mg/dL      Sodium 025 mEq/L      Potassium 4.1 mEq/L      Chloride 107 mEq/L      CO2 23 mEq/L      Calcium 8.5  mg/dL      Protein, Total 5.9 g/dL      Albumin 3.1 g/dL      AST (SGOT) 10 U/L      ALT 10 U/L      Alkaline Phosphatase 74 U/L      Bilirubin, Total 0.8 mg/dL      Globulin 2.8 g/dL      Albumin/Globulin Ratio 1.1     Anion Gap 9.0     eGFR 41.2 mL/min/1.73 m2     Lipase [[id number]] Collected: 03/09/22 1457     Updated: 03/09/22 1525     Lipase 21 U/L     Ethanol (Alcohol) Level [[id number]] Collected: 03/09/22 1457     Updated: 03/09/22 1525     Alcohol NONE DETECTED mg/dL     NT-proBNP [[id number]] Collected: 03/09/22 1457     Updated: 03/09/22 1501            RADIOLOGY IMAGING STUDIES    Ordered and independently interpreted AVAILABLE imaging studies.   Chest AP Portable   Final Result      No acute cardiopulmonary process.      Legrand Pitts, MD   03/09/2022 1:03 PM                ED Course as of 03/09/22 1552   Mon Mar 09, 2022   1403 Discussed with cardiologist Dr. Amado Nash, will consult.   [SL]   1459 Staff unable to get IV.   I was able to place an US guided IV in R AC.  [SL]   1524 Pt seen by Dr. Amado Nash, recommend 80mg  lasix.  [SL]      ED Course User Index  [SL] , MD           PRIMARY PROBLEM LIST         Chronic Illness with SEVERE Exacerbation/Progression    Differential Diagnosis:  Shortness of breath: cardiomyopathy, fluid overload, CHF exacerbation,  Pulmonary edema    Chronic Illness Impacting Care of the above problem: Congestive Heart Failure   Increases the risk of severe disease and Increase the risk of disease progression          Independent interpretation of  EKG:   Signed and interpreted by the EP.    Time Interpreted: 1241   Rate:  84   Rhythm: atrial sensed ventricular paced rhythm with occasional PVC   Axis: normal   Interval: normal   Blocks: none   ST segment: nonspecific t wave abnormality   Interpretation: No STEMI        Was management discussed with a consultant?: Yes (explain)  cardiology  ______________________________________________________________________    Risk of  Complications and/or Morbidity or Mortality of Patient Management  (select one if applicable)  Risk: Decision regarding hospitalization or escalation of hospital level of care        Additional Notes  DIAGNOSIS      Diagnosis:  Final diagnoses:   Shortness of breath   Acute on chronic congestive heart failure, unspecified heart failure type   Scrotal swelling       Disposition:  ED Disposition       None            Prescriptions:  Patient's Medications   New Prescriptions    No medications on file   Previous Medications    ASPIRIN EC 81 MG EC TABLET    Take 1 tablet (81 mg) by mouth daily    ATORVASTATIN (LIPITOR) 40 MG TABLET    Take 1 tablet (40 mg total) by mouth nightly    CARVEDILOL (COREG) 25 MG TABLET    Take 1 tablet (25 mg total) by mouth 2 (two) times daily with meals    EMPAGLIFLOZIN (JARDIANCE) 10 MG TABLET    Take 1 tablet (10 mg total) by mouth daily    FLUTICASONE-SALMETEROL (ADVAIR DISKUS) 100-50 MCG/ACT AEROSOL PWDR, BREATH ACTIVATED    Inhale 1 puff into the lungs 2 (two) times daily    GLUCOSE BLOOD (ONE TOUCH ULTRA TEST) TEST STRIP    1 each by Other route 2 (two) times daily Use as instructed    HYDROCODONE-CHLORPHENIRAMINE (TUSSIONEX) 10-8 MG/5ML EXTENDED-RELEASE SUSPENSION    Take 5 mLs by mouth 2 (two) times daily    INSULIN GLARGINE (LANTUS SOLOSTAR) 100 UNIT/ML INJECTION PEN    Patient takes 50 units at night    INSULIN PEN NEEDLE (BD PEN NEEDLE NANO U/F) 32G X 4 MM MISC    Use as directed bid    METOLAZONE (ZAROXOLYN) 2.5 MG TABLET    Take 1 tablet (2.5 mg total) by mouth once a week    NIACIN (NIASPAN) 500 MG CR TABLET    TAKE 1 TABLET BY MOUTH TWICE DAILY    PAROXETINE (PAXIL) 20 MG TABLET    Take 1 tablet (20 mg total) by mouth every morning    SACUBITRIL-VALSARTAN (ENTRESTO) 97-103 MG TAB PER TABLET    Take 1 tablet by mouth 2 (two) times daily    SEMGLEE, YFGN, 100 UNIT/ML SOLUTION PEN-INJECTOR    ADMINISTER 50 UNITS UNDER THE SKIN AT BEDTIME     SITAGLIPTIN-METFORMIN (JANUMET) 50-500 MG TABLET    Take 1 tablet by mouth 2 (two) times daily with meals    SPIRONOLACTONE (ALDACTONE) 25 MG TABLET    Take 0.5 tablets (12.5 mg total) by mouth daily    TORSEMIDE (DEMADEX) 20 MG TABLET    Take 2 tablets (40 mg) by mouth every morning AND 1 tablet (20 mg) every evening.   Modified Medications    No medications on file   Discontinued Medications    No medications on file           Procedures  PROCEDURE: ULTRASOUND GUIDED IV PLACEMENT    Performed by the emergency provider  Time: 1445  Consent:  Informed consent, after discussion of the risks, benefits, and alternatives to the procedure, was obtained.   Timeout: A timeout to verify the correct patient, procedure, and site was performed immediately prior to the procedure.   Indication: Lack of adequate venous access  Skin Preparation: Hand hygiene performed prior to venous catheter insertion.  The area was cleansed and prepped with chlorhexidine.  Location: R antecubital fossa  Technique: A 20 gauge IV was placed under direct visualization into the right AC fossa vein.    Assessment: Good patency and blood return.  Post-procedure: Patient tolerated the procedure well with no immediate complications.      Documentation:      Parts of this note were generated by the Epic EMR system/ Dragon speech recognition and may contain inherent errors or omissions not intended by the user. Grammatical errors, random word insertions, deletions, pronoun errors and incomplete sentences are occasional consequences of this technology due to software limitations. Not all errors are caught or corrected.     My documentation is often completed after the patient is no longer under my clinical care. In some cases, the Epic EMR may pull updated results into the above documentation which may not reflect all results or information that was available to me at the time of my medical decision making.           Ollen Barges, MD  03/09/22 [phone]

## 2022-03-10 ENCOUNTER — Encounter (INDEPENDENT_AMBULATORY_CARE_PROVIDER_SITE_OTHER): Payer: Self-pay | Admitting: Urology

## 2022-03-10 DIAGNOSIS — I10 Essential (primary) hypertension: Secondary | ICD-10-CM

## 2022-03-10 DIAGNOSIS — I361 Nonrheumatic tricuspid (valve) insufficiency: Secondary | ICD-10-CM

## 2022-03-10 DIAGNOSIS — I428 Other cardiomyopathies: Secondary | ICD-10-CM

## 2022-03-10 DIAGNOSIS — I340 Nonrheumatic mitral (valve) insufficiency: Secondary | ICD-10-CM

## 2022-03-10 LAB — HEPATIC FUNCTION PANEL
ALT: 9 U/L (ref 0–55)
AST (SGOT): 9 U/L (ref 5–41)
Albumin/Globulin Ratio: 1.1 (ref 0.9–2.2)
Albumin: 2.8 g/dL — ABNORMAL LOW (ref 3.5–5.0)
Alkaline Phosphatase: 64 U/L (ref 37–117)
Bilirubin Direct: 0.4 mg/dL (ref 0.0–0.5)
Bilirubin Indirect: 0.7 mg/dL (ref 0.2–1.0)
Bilirubin, Total: 1.1 mg/dL (ref 0.2–1.2)
Globulin: 2.5 g/dL (ref 2.0–3.6)
Protein, Total: 5.3 g/dL — ABNORMAL LOW (ref 6.0–8.3)

## 2022-03-10 LAB — ECG 12-LEAD
Atrial Rate: 84 {beats}/min
Q-T Interval: 400 ms
R Axis: -14 degrees

## 2022-03-10 LAB — BASIC METABOLIC PANEL
Anion Gap: 12 (ref 5.0–15.0)
BUN: 36 mg/dL — ABNORMAL HIGH (ref 9.0–28.0)
CO2: 20 mEq/L (ref 17–29)
Calcium: 8.3 mg/dL (ref 7.9–10.2)
Chloride: 106 mEq/L (ref 99–111)
Creatinine: 1.7 mg/dL — ABNORMAL HIGH (ref 0.5–1.5)
Glucose: 166 mg/dL — ABNORMAL HIGH (ref 70–100)
Potassium: 4.1 mEq/L (ref 3.5–5.3)
Sodium: 138 mEq/L (ref 135–145)
eGFR: 41.2 mL/min/{1.73_m2} — AB (ref >=60)

## 2022-03-10 LAB — CBC
Absolute NRBC: 0 10*3/uL (ref 0.00–0.00)
Hematocrit: 32.5 % — ABNORMAL LOW (ref 37.6–49.6)
Hgb: 10.7 g/dL — ABNORMAL LOW (ref 12.5–17.1)
MCH: 34 pg — ABNORMAL HIGH (ref 25.1–33.5)
MCHC: 32.9 g/dL (ref 31.5–35.8)
MCV: 103.2 fL — ABNORMAL HIGH (ref 78.0–96.0)
MPV: 12.1 fL (ref 8.9–12.5)
Nucleated RBC: 0 /100 WBC (ref 0.0–0.0)
Platelets: 137 10*3/uL — ABNORMAL LOW (ref 142–346)
RBC: 3.15 10*6/uL — ABNORMAL LOW (ref 4.20–5.90)
RDW: 15 % (ref 11–15)
WBC: 23.77 10*3/uL — ABNORMAL HIGH (ref 3.10–9.50)

## 2022-03-10 LAB — LACTIC ACID, PLASMA: Lactic Acid: 1.1 mmol/L (ref 0.2–2.0)

## 2022-03-10 LAB — GLUCOSE WHOLE BLOOD - POCT
Whole Blood Glucose POCT: 160 mg/dL — ABNORMAL HIGH (ref 70–100)
Whole Blood Glucose POCT: 220 mg/dL — ABNORMAL HIGH (ref 70–100)
Whole Blood Glucose POCT: 182 mg/dL — ABNORMAL HIGH (ref 70–100)
Whole Blood Glucose POCT: 172 mg/dL — ABNORMAL HIGH (ref 70–100)

## 2022-03-10 LAB — MAGNESIUM: Magnesium: 1.8 mg/dL (ref 1.6–2.6)

## 2022-03-10 MED ORDER — MAGNESIUM SULFATE IN D5W 1-5 GM/100ML-% IV SOLN
1.0000 g | INTRAVENOUS | Status: AC
Start: 2022-03-10 — End: 2022-03-10
  Administered 2022-03-10: 1 g via INTRAVENOUS
  Filled 2022-03-10: qty 100

## 2022-03-10 NOTE — Plan of Care (Signed)
Problem: Safety  Goal: Patient will be free from injury during hospitalization  Flowsheets (Taken 03/10/2022 0403)  Patient will be free from injury during hospitalization:   Assess patient's risk for falls and implement fall prevention plan of care per policy   Provide and maintain safe environment   Ensure appropriate safety devices are available at the bedside   Include patient/ family/ care giver in decisions related to safety   Hourly rounding   Use appropriate transfer methods     Problem: Pain  Goal: Pain at adequate level as identified by patient  Flowsheets (Taken 03/09/2022 1834 by Clifton Custard, RN)  Pain at adequate level as identified by patient:   Identify patient comfort function goal   Assess for risk of opioid induced respiratory depression, including snoring/sleep apnea. Alert healthcare team of risk factors identified.   Assess pain on admission, during daily assessment and/or before any "as needed" intervention(s)   Reassess pain within 30-60 minutes of any procedure/intervention, per Pain Assessment, Intervention, Reassessment (AIR) Cycle   Evaluate if patient comfort function goal is met   Evaluate patient's satisfaction with pain management progress   Offer non-pharmacological pain management interventions   Consult/collaborate with Pain Service   Consult/collaborate with Physical Therapy, Occupational Therapy, and/or Speech Therapy   Include patient/patient care companion in decisions related to pain management as needed     Problem: Everyday - Heart Failure  Goal: Stable Vital Signs and Fluid Balance  Flowsheets (Taken 03/10/2022 0403)  Stable Vital Signs and Fluid Balance:   Daily Standing Weights in the morning using the same scale, after using the bathroom and before breadfast.  If unable to stand, zero the bed and use the bed scale   Monitor, assess vital signs and telemetry per policy   Strict Intake/Output   Fluid Restriction   Assess for swelling/edema   Monitor labs and report  abnormalities to physician   Wean oxygen as needed if appropriate     Problem: Everyday - Heart Failure  Goal: Mobility/Activity is Maintained at Optimal Level for Patient  Flowsheets (Taken 03/10/2022 0403)  Mobility/Activity is Maintained at Optimal Level for Patient:   Increase mobility as tolerated/progressive mobility protocol   Perform active/passive ROM   Assess for changes in respiratory status, level of consciousness and/or development of fatigue   Consult with Physical Therapy and/or Occupational Therapy   Reposition patient every 2 hours and as needed unless able to reposition self   Maintain SCD's as Ordered

## 2022-03-10 NOTE — Progress Notes (Signed)
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  Coreg.  Entresto  Aldactone        13 beats of vtach. 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  Avoid hypoglycemia     Leukocytosis of 42k>23.77  without any fever or signs of infection.  Chest x-ray without any pneumonia.    Negative UA.  Ultrasound of the scrotum and testicles with Doppler on 03/06/2022 showed moderate size complex left hydrocele with septation no hyperemia or mass scrotal wall edema without fluid collection  CT abdomen pelvis focus on scrotum.  Urology has evaluated the patient prior to this hospitalization at the office.         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  Today creatinine of 1.7 BUN of 32 with GFR of 41 anion gap of 9 without any metabolic acidosis.  Continue BUN and creatinine monitoring due to IV diuresis avoid nephrotoxic medications     Coronary artery disease nonocclusive follows by [facility] as outpatient, currently on Coreg, Lipitor, aspirin     Anxiety and depression, currently on Paxil 20 mg daily.  Mood and affect are stable     COPD and sleep apnea,   no signs of acute respiratory wheezing or shortness of breath cough, currently on Breo and DuoNebs as needed     Hyperlipidemia,   currently on Lipitor           Patient has BMI=Body mass index is 29.7 kg/m.  Diagnosis: Overweight based on BMI criteria         DVT Prophylaxis:Medication VTE Prophylaxis Orders: enoxaparin (LOVENOX) syringe 40 mg, Reason for no VTE prophylaxis meds -     Mechanical VTE Prophylaxis Orders: Maintain sequential compression device   Central Line/Foley Catheter/PICC line status: None  Code Status: Full Code  Disposition: Pending clinical improvement.  Continue IV diuretic therapy  Cardiology evaluation  Ambulate the patient.  Close monitoring of volume status.  Daily labs.  Type of Admission:Inpatient  Hospital Problems:     Active Hospital Problems    Diagnosis    Acute on chronic congestive heart failure, unspecified heart failure type     Subjective:  SOB improving   No fever or chills.  Scrotal swelling.  No testicular pain at this point        Chief Complaint:on admission   Shortness of Breath, Groin Swelling, and Leg Swelling      Objective:     Vitals:    03/09/22 2348 03/10/22 0409 03/10/22 0759 03/10/22 1112   BP:  94/61 93/59 93/64    Pulse:  78 79 85   Resp: 19 20 18 22    Temp:  97.8 F (36.6 C) 97 F (36.1 C) 97 F (36.1 C)   TempSrc:  Temporal Temporal Temporal   SpO2:  100% 96% 95%   Weight:  97.6 kg (215 lb 3.2 oz)     Height:         Physical Exam:   Physical Exam  Constitutional:        Appearance: He is ill-appearing.   HENT:      Head: Normocephalic and atraumatic.      Mouth/Throat:      Mouth: Mucous membranes are moist.   Eyes:      Pupils: Pupils are equal, round, and reactive to light.   Cardiovascular:      Rate and Rhythm: Normal rate and regular rhythm.   Pulmonary:      Effort: Pulmonary effort is normal.      Breath sounds: Normal breath sounds.   Abdominal:      General: There is no distension.      Palpations: Abdomen is soft.      Tenderness: There is no abdominal tenderness.   Musculoskeletal:      Right lower leg: Edema present.      Left lower leg: Edema present.   Skin:     General: Skin is warm and dry.   Neurological:      General: No focal deficit present.      Mental Status: He is alert.   Psychiatric:         Mood and Affect: Mood normal.         Behavior: Behavior normal.             Results of Labs/imaging   All the results from labs and images within the last 24 hours are reviewed by me.  Hospitalist   Signed by:   Veona Bittman Otho KetAshfiqur Riata Ikeda  03/10/2022 11:19 AM      *This note was generated by the Epic EMR system/ Dragon speech recognition and may contain inherent errors or omissions not intended by the user. Grammatical errors, random word insertions, deletions, pronoun errors and incomplete sentences are occasional consequences of this technology due to software limitations. Not all errors are caught or corrected. If there are questions or concerns about the content of this note or information contained within the body of this dictation they should be addressed directly with the author for clarification

## 2022-03-10 NOTE — Plan of Care (Signed)
Problem: Everyday - Heart Failure  Goal: Stable Vital Signs and Fluid Balance  Outcome: Progressing  Flowsheets (Taken 03/10/2022 1014)  Stable Vital Signs and Fluid Balance:   Daily Standing Weights in the morning using the same scale, after using the bathroom and before breadfast.  If unable to stand, zero the bed and use the bed scale   Monitor, assess vital signs and telemetry per policy   Strict Intake/Output   Monitor labs and report abnormalities to physician   Fluid Restriction   Assess for swelling/edema  Goal: Mobility/Activity is Maintained at Optimal Level for Patient  Outcome: Progressing  Flowsheets (Taken 03/10/2022 1014)  Mobility/Activity is Maintained at Optimal Level for Patient:   Increase mobility as tolerated/progressive mobility protocol   Maintain SCD's as Ordered   Perform active/passive ROM   Reposition patient every 2 hours and as needed unless able to reposition self   Assess for changes in respiratory status, level of consciousness and/or development of fatigue   Consult with Physical Therapy and/or Occupational Therapy  Goal: Nutritional Intake is Adequate  Outcome: Progressing  Flowsheets (Taken 03/10/2022 1014)  Nutritional Intake is Adequate:   Cardiac diet-2 gm Sodium   Fluid Restricction if needed   Consult/Collaborate with Nutritionist   Patient and family teaching on low sodium diet   Assess appetite,anorexia and amount of meal/food tolerated   Encourage/perform oral hygiene as appropriate  Goal: Teaching-Using CHF Warning Zones and Educational Videos  Outcome: Progressing  Flowsheets (Taken 03/10/2022 1014)  Teaching-Using CHF Warning Zones and Educational Videos:   Signs & Symptoms of CHF   Daily Standing Weights & record   CHF Warning Zones and when to call for help   Sodium Restriction   Fluid Restriction if appropriate   Document in the Education Tab in EPIC with Teach-back     Problem: Safety  Goal: Patient will be free from injury during hospitalization  Outcome:  Progressing  Flowsheets (Taken 03/10/2022 1014)  Patient will be free from injury during hospitalization:   Assess patient's risk for falls and implement fall prevention plan of care per policy   Use appropriate transfer methods   Provide and maintain safe environment   Ensure appropriate safety devices are available at the bedside   Hourly rounding   Include patient/ family/ care giver in decisions related to safety   Assess for patients risk for elopement and implement Elopement Risk Plan per policy  Goal: Patient will be free from infection during hospitalization  Outcome: Progressing  Flowsheets (Taken 03/10/2022 1014)  Free from Infection during hospitalization:   Assess and monitor for signs and symptoms of infection   Monitor lab/diagnostic results   Monitor all insertion sites (i.e. indwelling lines, tubes, urinary catheters, and drains)   Encourage patient and family to use good hand hygiene technique     Problem: Discharge Barriers  Goal: Patient will be discharged home or other facility with appropriate resources  Outcome: Progressing  Flowsheets (Taken 03/10/2022 1014)  Discharge to home or other facility with appropriate resources:   Provide appropriate patient education   Provide information on available health resources   Initiate discharge planning     Problem: Psychosocial and Spiritual Needs  Goal: Demonstrates ability to cope with hospitalization/illness  Outcome: Progressing  Flowsheets (Taken 03/10/2022 1014)  Demonstrates ability to cope with hospitalizations/illness:   Encourage verbalization of feelings/concerns/expectations   Provide quiet environment   Assist patient to identify own strengths and abilities   Encourage patient to set small goals for  self   Encourage participation in diversional activity   Reinforce positive adaptation of new coping behaviors   Include patient/ patient care companion in decisions

## 2022-03-10 NOTE — Progress Notes (Addendum)
[facility] PROGRESS NOTE    [facility]  [facility] [address] PROGRESSIVE CARE  [address]  [place] [address]  Loc: [phone]    Date Time: 03/10/22 10:52 AM  Patient Name: [facility] Day: 1    Subjective/Cardiac Relevant Events:   Feeling a little better, had a rough night, SOB is improved still with DOE, and scrotal edema about the same  Creat stable  Unreliable I and O's with some unmeasured outputs but lost 1 lb    ASSESSMENT:   Patient is a 76 y.o. male with the following relevant diagnoses:    Acute on chronic systolic CHF with last ejection fraction being 25 to 30% by echo August 2022.  Exact trigger uncertain although he does note he was drinking a lot of fluids recently.  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  PA pressures were 54 mmHg.    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  Diabetes  Prior duodenal ulcer      RECOMMENDATIONS:     Recommended medical therapy for congestive heart failure:  ACEIARB/ARNI: entresto 97-103 BID  BB: coreg 25 BID  MRA Antagonist: aldactone 12.5 daily   SGLT2i: TBD  Diuretic: lasix 80 mg IV BID  ASA and Lipitor  Patient remains with significant leukocytosis, W/U per IM  Will bump lasix if AM UOP is not significant  May need to decrease coreg and entresto to allow for mass diuresis (multiple meds held this am due to SBP less then 100) will change holding parameters for MAP less then 65  Check LFTs and lactic acid    Telemetry/Labs Reviewed/Intake-Output:     Telemetry reviewed: SR (a sensed V paced and occasional AV pacing)    Recent Labs   Lab 03/09/22  1740 03/09/22  1457   hs Troponin-I 19.9 18.6   hs Troponin-I Delta 1.3  --              Recent Labs   Lab 03/09/22  1457   Bilirubin, Total 0.8   Protein, Total 5.9*   Albumin 3.1*   ALT 10    AST (SGOT) 10             Recent Labs   Lab 03/10/22  0604 03/09/22  1457   WBC 23.77* 29.43*   Hgb 10.7* 11.2*   Hematocrit 32.5* 34.3*   Platelets 137* 142     Recent Labs   Lab 03/10/22  0604 03/09/22  1457   Sodium 138 139   Potassium 4.1 4.1   Chloride 106 107   CO2 20 23   BUN 36.0* 32.0*   Creatinine 1.7* 1.7*   eGFR 41.2* 41.2*   Glucose 166* 102*   Calcium 8.3 8.5       Lab Results   Component Value Date    BNP 4,091 (H) 03/09/2022        Intake/Output Summary (Last 24 hours) at 03/10/2022 1052  Last data filed at 03/10/2022 0857  Gross per 24 hour   Intake 460 ml   Output 2270 ml   Net -1810 ml       Medications:      Scheduled Meds: PRN Meds:    aspirin EC, 81 mg, Oral, Daily  atorvastatin, 40 mg, Oral, QHS  carvedilol, 25  mg, Oral, BID Meals  enoxaparin, 40 mg, Subcutaneous, Q24H SCH  fluticasone furoate-vilanterol, 1 puff, Inhalation, QAM  furosemide, 80 mg, Intravenous, BID  insulin glargine, 20 Units, Subcutaneous, QAM  insulin lispro, 1-3 Units, Subcutaneous, QHS  insulin lispro, 1-5 Units, Subcutaneous, TID AC  metOLazone, 2.5 mg, Oral, Weekly  niacin, 500 mg, Oral, BID  PARoxetine, 20 mg, Oral, QAM  sacubitril-valsartan, 1 tablet, Oral, BID  spironolactone, 12.5 mg, Oral, Daily        Continuous Infusions:   acetaminophen, 650 mg, Q6H PRN  dextrose, 15 g of glucose, PRN   Or  dextrose, 12.5 g, PRN   Or  dextrose, 12.5 g, PRN   Or  glucagon (rDNA), 1 mg, PRN          Physical Exam:     Vitals:    03/09/22 2334 03/09/22 2348 03/10/22 0409 03/10/22 0759   BP: 95/57  94/61 93/59   Pulse: 81  78 79   Resp: 22 19 20 18    Temp: 97.6 F (36.4 C)  97.8 F (36.6 C) 97 F (36.1 C)   TempSrc: Temporal  Temporal Temporal   SpO2: 100%  100% 96%   Weight:   97.6 kg (215 lb 3.2 oz)    Height:             01/13/2022     8:43 AM 03/03/2022     2:31 PM 03/06/2022     1:50 PM 03/09/2022    10:54 AM 03/09/2022    12:24 PM 03/09/2022     5:30 PM 03/10/2022     4:09 AM   Weight Monitoring   Height 176 cm   182.9 cm 182.9 cm      Height Method     Stated     Weight 89.812 kg 98.431 kg 99.338 kg 102.513 kg 99.338 kg 98.113 kg 97.614 kg   Weight Method     Stated Standing Scale Standing Scale   BMI (calculated) 29.1 kg/m2   30.7 kg/m2 29.8 kg/m2         Constitutional: Cooperative, alert, no acute distress.  Neck: No carotid bruits, JVP elevated   Cardiac: Regular rate and rhythm, normal S1 and S2; no S3 or S4, no murmurs, no rubs, no gallops.  Pulmonary: Clear to auscultation on the Right, decreased BS at the L base, no overt rales or wheezes  Extremities: 2+ pitting edema in BLE  Vascular: +2 pulses in radial artery bilaterally, 2+ pedal pulses bilaterally.    -------------------------------------------------------------------------------------  Signed by:         Amado Nash, PA     ------------------------------------------------  Attestation:  I have seen the patient and performed the substantive portion of the visit with my personal exam as below.    I have reviewed and updated the documented findings and plan of care accordingly with my changes in italics.     My physical exam:  Constitutional: Cooperative, alert, no acute distress.  Neck: No carotid bruits, JVP normal.  Cardiac: Regular rate and rhythm, normal S1 and S2; no S3 or S4, no murmur, no rubs, no gallops.  Pulmonary: No rales  Extremities: 1+ edema.  Vascular: +2 pulses in radial artery bilaterally, 2+ pedal pulses bilaterally.          Signed by: Alease Frame Marnette Burgess, MD, [facility]  ------------------------------------------------      [facility]    [facility] [facility] Contact Information   [facility]  Secure Chat (Group):   [facility] [place] [facility]  APP Spectralink:  [phone]    MD Spectralink :  [id number]    After hours, non urgent consult line:  [phone]    After hours, physician on-call:  [place] (Group):   [place] [facility]    APP [place]:  [phone]    MD [place] :  [phone]      After hours, non urgent consult  line:  [phone]    After hours, physician on-call:  [place] (Group):   [facility]    APP [place]:  [phone]    MD [place] :  [phone]      After hours, non urgent consult line:  [phone]    After hours, physician on-call:  [place] (Group):   [place]:  [phone]    MD [place] :  [phone]      After hours, non urgent consult line:  [phone]    After hours, physician on-call:  [phone]       This note was generated by the Dragon speech recognition and may contain errors or omissions not intended by the user. Grammatical errors, random word insertions, deletions, pronoun errors, and incomplete sentences are occasional consequences of this technology due to software limitations. Not all errors are caught or corrected. If there are questions or concerns about the content of this note or information contained within the body of this dictation, they should be addressed directly with the author for clarification.

## 2022-03-10 NOTE — UM Notes (Signed)
Patient Name: [name] [name]   Patient DOB: 10-20-1945    Reference Auth # REF# [id number]  Review for DOS: Admission clinicals for 03/09/22-03/10/22    C/b to [name] [phone]  [facility] Utilization Review   NPI #: [id number]   Tax ID#  [id number]  Department Phone#: [phone]  Please fax final authorization and requests for additional information to [phone]      DIAGNOSIS    ICD-10-CM    1. Acute on chronic congestive heart failure, unspecified heart failure type  I50.9       2. Shortness of breath  R06.02       3. Scrotal swelling  N50.89             PRESENTED TO ED: 03/09/2022 at 1342     Reason For Hospitalization:    Pt is a 76 y.o. male who arrived to the ER with C/OShortness of Breath, Groin Swelling, and Leg Swelling    Pt was referred her by urology. Pt was told he had swelling in his scrotum that he noticed about a week ago. Pt was seen by Dr. Edwyna Shell this AM at 1100 and was told that it was not a hydrocele that it was a heart problem and is accumulating fluid. Pt c/o of DOE that has progressively gotten worse over the past week    On EXAM : Scrotal and BLE pitting edema to below the knees    PMH:  has a past medical history of Acute CHF, Acute systolic (congestive) heart failure (08/2017), Anxiety, CAD (coronary artery disease), Cardiomyopathy, CHF (congestive heart failure) (08/12/2014, 2013), Chronic obstructive pulmonary disease, Coronary artery disease (2011), Depression, echocardiogram (02/2016, 11/2016, 09/2017, 12/20/2017), GERD (gastroesophageal reflux disease), Heart attack (2011), Hyperlipemia, Hypertension, ICD (implantable 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, Ischemic cardiomyopathy, Nonischemic cardiomyopathy, NSTEMI (non-ST elevated myocardial infarction), 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    Vitals:  97.8, HR 97,  RR 22-20, BP 94/71-95/57, sats 98%    Height : 6' / Weight : 215 lbs    ED Abnormal Labs:  WBC 29.43, H&H 11.2/34.3, RBC 3.25, Glucose 102, BUN 32, Cr 1.7, ALB 3.1, NT-proBNP 4091    Radiologic Exams:   Chest AP Portable    Result Date: 03/09/2022  No acute cardiopulmonary process. Legrand Pitts, MD 03/09/2022 1:03 PM    US Scrotum and Testicles w Doppler Ltd    Result Date: 03/06/2022   1. Moderate sized complex left hydrocele with septations. 2. No intratesticular masses or hyperemia. 3. Scrotal wall edema without fluid collection. Linna Caprice, MD 03/06/2022 11:10 AM        EKG: Atrial-sensed ventricular-paced rhythm OCCASIONAL AV dual-paced complexes AND OCCASIONAL PREMATURE VENTRICULAR COMPLEXES   ABNORMAL ECG   WHEN COMPARED WITH ECG OF 17-Feb-2021 03:22,   PREMATURE VENTRICULAR COMPLEXES ARE NOW PRESENT   VENTRICULAR RATE HAS INCREASED BY   8 BPM    ED meds: 80mg  IV Lasix    Transfer to PCU as Inpatient. IP admit order 03/09/22 1554.  ADMIT : CHF exacerbation, manifested as scrotal edema and lower extremity edema, and tachypnea (RR to 22)    MD H&P :  CHF exacerbation-manifested as scrotal edema and lower extremity edema, admission, IV Lasix twice daily with strict I's and O's and daily weights with 1500 cc fluid restricted diet and consult cardiology.  Type 2 diabetes-continue home regimen as well as a carbohydrate consistent diet  with Accu-Cheks and sliding scale insulin cleared  Hypertension-stable, continue home medications  COPD-no evidence of acute exacerbation, will continue home medications and monitor of respiratory status closely.    Consults:     Cardio consult 03/09/22 :  Assessment:   Acute on chronic systolic CHF with last ejection fraction being 25 to 30% by echo August 2022.  Moderate tricuspid and mitral regurgitation present.  PA pressures were 54 mmHg.  Exact trigger uncertain although he does note he was drinking a lot of fluids recently.  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  In the past he has required 80 mg IV twice daily.  Please continue his cardiomyopathy regimen including Entresto, Jardiance, carvedilol, spironolactone.  We will notify heart failure service of patient admission    Testing/Procedures:    Treatment Plan:     PCU, tele monitor, diet as tol, fluid rest 1500ml, external urinary catheter, daily EKG, supp O2, I/O, VS q4, RT, fall prec, Cardio consult    Current Facility-Administered Medications   Medication Dose Route Frequency    aspirin EC  81 mg Oral Daily    atorvastatin  40 mg Oral QHS    carvedilol  25 mg Oral BID Meals    enoxaparin  40 mg Subcutaneous Q24H SCH    fluticasone furoate-vilanterol  1 puff Inhalation QAM    furosemide  80 mg Intravenous BID    insulin glargine  20 Units Subcutaneous QAM    insulin lispro  1-3 Units Subcutaneous QHS    insulin lispro  1-5 Units Subcutaneous TID AC    metOLazone  2.5 mg Oral Weekly    niacin  500 mg Oral BID    PARoxetine  20 mg Oral QAM    sacubitril-valsartan  1 tablet Oral BID    spironolactone  12.5 mg Oral Daily     Current Facility-Administered Medications   Medication Dose Route Frequency Last Rate     Current Facility-Administered Medications   Medication Dose Route    acetaminophen  650 mg Oral    dextrose  15 g of glucose Oral    Or    dextrose  12.5 g Intravenous    Or    dextrose  12.5 g Intravenous    Or    glucagon (rDNA)  1 mg Intramuscular      This clinical review is based on/compiled from documentation provided by the treatment team within the patient's medical record.        On PCU 03/10/22 :  -Still w/ DOE  -Scrotal edema no improved  -Cont IV Lasix 80mg  BID    97.8, HR 85, RR 20-22, BP 93/59-93/64, sats 95% RA  Mg 1.8, Glucose 220,  BUN 36, Cr 1.7, WBC 23.77, H&H 10.7/32.5, Platelets 137, RBC 3.15    C/o mod HA pain 4/10 : 650mg  po Tylenol xs 1      Per Cardio 03/10/22 :  ASSESSMENT:   Patient is a 76 y.o. male with the following relevant diagnoses:     Acute on chronic systolic CHF with last ejection fraction being 25 to 30% by echo August 2022.  Exact trigger uncertain although he does note he was  drinking a lot of fluids recently.  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  PA pressures were 54 mmHg.    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  Diabetes  Prior duodenal ulcer  RECOMMENDATIONS:      Recommended medical therapy for congestive heart failure:  ACEIARB/ARNI: entresto 97-103 BID  BB: coreg 25 BID  MRA Antagonist: aldactone 12.5 daily   SGLT2i: TBD  Diuretic: lasix 80 mg IV BID  ASA and Lipitor  Patient remains with significant leukocytosis, W/U per IM  Will bump lasix if AM UOP is not significant  May need to decrease coreg and entresto to allow for mass diuresis (multiple meds held this am due to SBP less then 100) will change holding parameters for MAP less then 65  Check LFTs and lactic acid      MD assessment/plan 03/10/22 :  Weight gain and scrotal edema 2/2 Acute On chronic Systolic and diastolic dysfunctions with EF of 25-30 % by ECHO in 05/2021.  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  Coreg.  Entresto  Aldactone     13 beats of vtach. 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  Avoid hypoglycemia     Leukocytosis of 42k>23.77  without any fever or signs of infection.  Chest x-ray without any pneumonia.    Negative UA.  Ultrasound of the scrotum and testicles with Doppler on 03/06/2022 showed moderate size complex left hydrocele with septation no hyperemia or mass scrotal wall edema without fluid collection  CT abdomen pelvis focus on scrotum.  Urology has evaluated the patient prior to this hospitalization at the office.     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  Today creatinine of 1.7 BUN of 32 with GFR of 41 anion gap of 9 without any metabolic acidosis.  Continue BUN and creatinine monitoring due to IV diuresis avoid nephrotoxic medications     Coronary artery disease nonocclusive follows by [facility] as outpatient, currently on Coreg, Lipitor, aspirin     Anxiety and depression, currently on Paxil 20 mg daily.  Mood and affect are stable     COPD and sleep apnea,   no signs of acute respiratory wheezing or shortness of breath cough, currently on Breo and DuoNebs as needed     Hyperlipidemia,   currently on Lipitor    Patient has BMI=Body mass index is 29.7 kg/m.  Diagnosis: Overweight based on BMI criteria     DVT Prophylaxis:Medication VTE Prophylaxis Orders: enoxaparin (LOVENOX) syringe 40 mg, Reason for no VTE prophylaxis meds -

## 2022-03-11 ENCOUNTER — Inpatient Hospital Stay: Payer: Commercial Managed Care - POS

## 2022-03-11 DIAGNOSIS — I509 Heart failure, unspecified: Secondary | ICD-10-CM

## 2022-03-11 DIAGNOSIS — I5023 Acute on chronic systolic (congestive) heart failure: Secondary | ICD-10-CM

## 2022-03-11 LAB — MAN DIFF ONLY
Band Neutrophils Absolute: 0 10*3/uL (ref 0.00–1.00)
Band Neutrophils: 0 %
Basophils Absolute Manual: 0 10*3/uL (ref 0.00–0.08)
Basophils Manual: 0 %
Eosinophils Absolute Manual: 0.28 10*3/uL (ref 0.00–0.44)
Eosinophils Manual: 1 %
Lymphocytes Absolute Manual: 21.52 10*3/uL — ABNORMAL HIGH (ref 0.42–3.22)
Lymphocytes Manual: 78 %
Monocytes Absolute: 1.93 10*3/uL — ABNORMAL HIGH (ref 0.21–0.85)
Monocytes Manual: 7 %
Neutrophils Absolute Manual: 3.59 10*3/uL (ref 1.10–6.33)
Other Cells: 1 %
Segmented Neutrophils: 13 %

## 2022-03-11 LAB — CBC AND DIFFERENTIAL
Absolute NRBC: 0 10*3/uL (ref 0.00–0.00)
Hematocrit: 34.2 % — ABNORMAL LOW (ref 37.6–49.6)
Hgb: 11.4 g/dL — ABNORMAL LOW (ref 12.5–17.1)
Instrument Absolute Neutrophil Count: 3.17 10*3/uL (ref 1.10–6.33)
MCH: 34.1 pg — ABNORMAL HIGH (ref 25.1–33.5)
MCHC: 33.3 g/dL (ref 31.5–35.8)
MCV: 102.4 fL — ABNORMAL HIGH (ref 78.0–96.0)
MPV: 11.8 fL (ref 8.9–12.5)
Nucleated RBC: 0 /100 WBC (ref 0.0–0.0)
Platelets: 151 10*3/uL (ref 142–346)
RBC: 3.34 10*6/uL — ABNORMAL LOW (ref 4.20–5.90)
RDW: 14 % (ref 11–15)
WBC: 27.59 10*3/uL — ABNORMAL HIGH (ref 3.10–9.50)

## 2022-03-11 LAB — ECG 12-LEAD
Atrial Rate: 87 {beats}/min
P Axis: -7 degrees
P-R Interval: 144 ms
Q-T Interval: 388 ms
QRS Duration: 90 ms
QTC Calculation (Bezet): 466 ms
R Axis: -66 degrees
T Axis: 93 degrees
Ventricular Rate: 87 {beats}/min

## 2022-03-11 LAB — PHOSPHORUS: Phosphorus: 3.7 mg/dL (ref 2.3–4.7)

## 2022-03-11 LAB — COMPREHENSIVE METABOLIC PANEL
ALT: 8 U/L (ref 0–55)
AST (SGOT): 8 U/L (ref 5–41)
Albumin/Globulin Ratio: 0.9 (ref 0.9–2.2)
Albumin: 2.6 g/dL — ABNORMAL LOW (ref 3.5–5.0)
Alkaline Phosphatase: 65 U/L (ref 37–117)
Anion Gap: 12 (ref 5.0–15.0)
BUN: 41 mg/dL — ABNORMAL HIGH (ref 9.0–28.0)
Bilirubin, Total: 1.2 mg/dL (ref 0.2–1.2)
CO2: 20 mEq/L (ref 17–29)
Calcium: 8.2 mg/dL (ref 7.9–10.2)
Chloride: 104 mEq/L (ref 99–111)
Creatinine: 1.8 mg/dL — ABNORMAL HIGH (ref 0.5–1.5)
Globulin: 2.8 g/dL (ref 2.0–3.6)
Glucose: 164 mg/dL — ABNORMAL HIGH (ref 70–100)
Potassium: 4.1 mEq/L (ref 3.5–5.3)
Protein, Total: 5.4 g/dL — ABNORMAL LOW (ref 6.0–8.3)
Sodium: 136 mEq/L (ref 135–145)
eGFR: 38.5 mL/min/{1.73_m2} — AB (ref >=60)

## 2022-03-11 LAB — SEDIMENTATION RATE: Sed Rate: >120 mm/Hr — ABNORMAL HIGH (ref 0–15)

## 2022-03-11 LAB — BASIC METABOLIC PANEL
Anion Gap: 12 (ref 5.0–15.0)
BUN: 50 mg/dL — ABNORMAL HIGH (ref 9.0–28.0)
CO2: 23 mEq/L (ref 17–29)
Calcium: 8.2 mg/dL (ref 7.9–10.2)
Chloride: 99 mEq/L (ref 99–111)
Creatinine: 2.2 mg/dL — ABNORMAL HIGH (ref 0.5–1.5)
Glucose: 257 mg/dL — ABNORMAL HIGH (ref 70–100)
Potassium: 4.6 mEq/L (ref 3.5–5.3)
Sodium: 134 mEq/L — ABNORMAL LOW (ref 135–145)
eGFR: 30.2 mL/min/{1.73_m2} — AB (ref >=60)

## 2022-03-11 LAB — GLUCOSE WHOLE BLOOD - POCT
Whole Blood Glucose POCT: 153 mg/dL — ABNORMAL HIGH (ref 70–100)
Whole Blood Glucose POCT: 191 mg/dL — ABNORMAL HIGH (ref 70–100)
Whole Blood Glucose POCT: 193 mg/dL — ABNORMAL HIGH (ref 70–100)
Whole Blood Glucose POCT: 289 mg/dL — ABNORMAL HIGH (ref 70–100)
Whole Blood Glucose POCT: 224 mg/dL — ABNORMAL HIGH (ref 70–100)

## 2022-03-11 LAB — CELL MORPHOLOGY
Cell Morphology: ABNORMAL — AB
Platelet Estimate: NORMAL

## 2022-03-11 LAB — MAGNESIUM
Magnesium: 1.9 mg/dL (ref 1.6–2.6)
Magnesium: 2.2 mg/dL (ref 1.6–2.6)

## 2022-03-11 LAB — C-REACTIVE PROTEIN: C-Reactive Protein: 23.6 mg/dL — ABNORMAL HIGH (ref 0.0–1.1)

## 2022-03-11 MED ORDER — SODIUM CHLORIDE 0.9 % IV MBP
4.5000 g | Freq: Three times a day (TID) | INTRAVENOUS | Status: DC
Start: 2022-03-11 — End: 2022-03-12
  Administered 2022-03-11 – 2022-03-12 (×3): 4.5 g via INTRAVENOUS
  Filled 2022-03-11 (×3): qty 20

## 2022-03-11 MED ORDER — NOREPINEPHRINE-DEXTROSE 8-5 MG/250ML-% IV SOLN (IHS)
0.0000 ug/min | INTRAVENOUS | Status: DC
Start: 2022-03-12 — End: 2022-03-14
  Administered 2022-03-11: 5 ug/min via INTRAVENOUS
  Administered 2022-03-12: 7 ug/min via INTRAVENOUS
  Filled 2022-03-11 (×2): qty 250

## 2022-03-11 MED ORDER — ALBUMIN HUMAN/BIOSIMILIAR 5% IV SOLN (WRAP)
25.0000 g | Freq: Once | INTRAVENOUS | Status: AC
Start: 2022-03-12 — End: 2022-03-12
  Administered 2022-03-11: 25 g via INTRAVENOUS
  Filled 2022-03-11: qty 500

## 2022-03-11 MED ORDER — ALBUMIN HUMAN/BIOSIMILIAR 5% IV SOLN (WRAP)
25.0000 g | Freq: Once | INTRAVENOUS | Status: DC
Start: 2022-03-12 — End: 2022-03-11
  Filled 2022-03-11: qty 500

## 2022-03-11 NOTE — Progress Notes (Signed)
Initial Case Management Assessment and Discharge Planning  [facility]   Patient Name: [name] [name], [name] [name]   Date of Birth [birth year]   Attending Physician: Bobbye Charleston, MD   Primary Care Physician: Zorita Pang, MD   Length of Stay 1   Reason for Consult / Chief Complaint Shortness of Breath, Groin Swelling, and Leg Swelling          Situation   Admission DX:   1. Acute on chronic congestive heart failure, unspecified heart failure type    2. Shortness of breath    3. Scrotal swelling      A/O Status: X 3    LACE Score: 10    Patient admitted from: ER  Admission Status: inpatient    Health Care Agent: Spouse  Name: Rodriquez Thorner  Phone number: [phone]     Background     Advanced directive:   Not Received    Code Status:   Full Code     Residence: One story home with his wife.    PCP: Zorita Pang, MD  Patient Contact:   [phone] (home)     [phone] (mobile)     Emergency contact:   Extended Emergency Contact Information  Primary Emergency Contact: [facility]  Address: [address]           [place], [place] [address] [place] Phone: [phone]  Relation: Spouse  Secondary Emergency Contact: Rozell Searing  Address: [place], [place]  Home Phone: [phone]  Relation: Mother in law      ADL/IADL's: Independent and driving  Previous Level of function: 7 Independent     DME: None    Pharmacy:     [facility] [phone] [place], [place] - [address] AT [facility] [address]  [address]  [place] [address]  Phone: [phone] Fax: [phone]      Prescription Coverage: Yes    Home Health: The patient is not currently receiving home health services.    Previous SNF/AR: [facility]    COVID Vaccine Status: Vaccinated x 3 (J&J)    Date First IMM given: N/A  UAI on file?: No   Assessment   Assessment completed at bedside with patient. CM verified PCP, prescription  coverage and preferred pharmacy; CM made corrections in chart as needed.     BARRIERS TO DISCHARGE: No significant barrier to discharge   Recommendation   D/C Plan A: Home with family    D/C Plan B: Home with home health    Agreeable to Home with family post-discharge:  Yes    Transport for discharge? Mode of transportation: Sales executive - Family/Friend to drive patient     Neldon Labella. Markeshia Giebel, BSN, RN, CCM  RN Case Manager II  [facility]    P: [phone]   F: [phone]  [email or web address]

## 2022-03-11 NOTE — Consults (Signed)
[facility]   ADVANCED HEART FAILURE CONSULTATION REPORT  [facility]    Date Time: 03/11/22 9:49 AM  Patient Name: [name] [name]  Requesting Physician: Bobbye Charleston, MD       Reason for Consultation:   CHF    History:   Carmichael Burdette is a 76 y.o. male admitted on 03/09/2022.  We have been asked by Bobbye Charleston, MD,  to provide cardiac consultation, regarding CHF.      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  For HF GDMT, he is on carvedilol 25 mg twice daily, Entresto 97/23 mg twice daily, Jardiance 10 mg daily, and spironolactone 12.5 mg daily in the outpatient setting.  He has a Medtronic ICD in place.    He was initially seen by urology couple weeks prior for severe scrotal edema.  He also mentioned that he had lower extremity edema as well.  Eventually was admitted for worsening swelling and shortness of breath.  He has been treated with IV Lasix 80 mg twice daily dosing.    Chest x-ray showed no acute cardiopulmonary process.  He had an ultrasound of the scrotum and testicles, showed moderate size, left high steel scrotal wall edema.  Past couple days, he has noticed improvement in his breathing and lower extremity edema.  However, he still has some lingering scrotal edema that is not quite back to baseline.      Past Medical History:     Past Medical History:   Diagnosis Date    Acute CHF     NOV  & DEC 2018, - DIALYSIS CATH PLACED Aug 24 2017 TO REMOVE FLUID     Acute systolic (congestive) heart failure 08/2017    Anxiety     CAD (coronary artery disease)     Cardiomyopathy     nonischemic    CHF (congestive heart failure) 08/12/2014, 2013    Chronic obstructive pulmonary disease     POSSIBLE PER PT HE USES  SYNBICORT BID ABD  BREO INHALER PRN    Coronary artery disease 2011    CORONARY STENT PLACEMENT FOLLOWED BY [facility]    Depression     echocardiogram 02/2016, 11/2016, 09/2017, 12/20/2017     GERD (gastroesophageal reflux disease)     Heart attack 2011    and 2014    Hyperlipemia     Hypertension     ICD (implantable 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     PLACED 2014  [facility]  LAST INTERROGATION April 01 2018 REPORT REQUESTED    Ischemic cardiomyopathy     EF 15% ON ECHO 09-20-2017 DR GARG IN EP[facility]    Nonischemic cardiomyopathy     NSTEMI (non-ST elevated myocardial infarction)     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    Pacemaker 2014    MEDTRONIC ICD/PACEMAKER COMBO LAST INTERROGATION  12-12 2018    Pneumonia 09/2016    Primary cardiomyopathy     Ischemic cardiomyopathy EF 15% ON ECHO 09-20-2017 DR GARG IN EPIC    Sepsis 08/2017    Syncope and collapse     PRIOR TO ICD/PACEMAKER PLACED 2014    Type 2 diabetes mellitus, controlled DX 1998    BS  AVG 100  A1C 7.4 Dec 27 2017    [place]     PT SEE HIS PMD April 01 2018 RE THIS-TOLD TO SEE PMD BY [facility] JUNE 12 WHEN THEY SAW HIM FOR A CL  Past Surgical History:     Past Surgical History:   Procedure Laterality Date    BIV  11/10/2016    ICD METRONIC  UPGRADE     CARDIAC CATHETERIZATION  03/2010    CORONARY STENT PLACEMENT X 2 PER PT; LM normal, LAD 70-80% distal lesion at apex, 20% lesion mid CFX    CARDIAC CATHETERIZATION  12/2012    CARDIAC DEFIBRILLATOR PLACEMENT  12/2012    [facility]    CARDIAC PACEMAKER PLACEMENT  2014    MEDTRONIC ICD/PACEMAKER PLACED [facility]    CIRCUMCISION  AGE 73    COLONOSCOPY  2009    CORRECTION HAMMER TOE      duodenal ulcer  1973    STRESS RELATED IN SCHOOL    ECHOCARDIOGRAM, TRANSTHORACIC  02/2016 11/2016 09/2017 12/2017    ECHOCARDIOGRAM, TRANSTHORACIC  02/2016,11/2016,12/20/2017    EGD N/A 08/15/2014    Procedure: EGD;  Surgeon: Colon Branch, MD;  Location: [facility];  Service: Gastroenterology;  Laterality: N/A;  egd w/ bx    EGD  1975    EGD, COLONOSCOPY N/A 04/04/2018    Procedure: EGD with bxs, COLONOSCOPY with polypectomy and clipping;  Surgeon: Doyne Keel, MD;  Location: [facility];  Service:  Gastroenterology;  Laterality: N/A;    FRACTURE SURGERY  AGE 76     RT  ANKLE- CAST APPLIED    HERNIA REPAIR  AGE 73    LEFT INGUINAL HERNIA    MPI nuclear study  06/2016    orthopedic surgery  AGE 32    RT foot corrective - HAMMERTOE    TONSILLECTOMY AND ADENOIDECTOMY  1954       Family History:     Family History   Problem Relation Age of Onset    Breast cancer Mother     Heart attack Mother 42    Hypertension Mother     Diabetes Mother     Coronary artery disease Mother     Diabetes Brother     Heart attack Father     Hypertension Father        Social History:     Social History     Socioeconomic History    Marital status: Married     Spouse name: Teacher, music    Number of children: 0    Years of education: Not on file    Highest education level: Not on file   Occupational History    Occupation: Runner, 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, [place]    Tobacco Use    Smoking status: Never    Smokeless tobacco: Never   Vaping Use    Vaping status: Never Used   Substance and Sexual Activity    Alcohol use: No     Alcohol/week: 0.0 standard drinks of alcohol     Comment: occasionally    Drug use: No    Sexual activity: Yes     Partners: Female   Other Topics Concern    Not on file   Social History Narrative    Not on file     Social Determinants of Health     Financial Resource Strain: Not on file   Food Insecurity: Not on file   Transportation Needs: Not on file   Physical Activity: Not on file   Stress: Not on file   Social Connections: Not on file   Intimate Partner Violence: Not on file   Housing Stability: Not on file       Allergies:  No Known Allergies    Medications:     Medications Prior to Admission   Medication Sig    aspirin EC 81 MG EC tablet Take 1 tablet (81 mg) by mouth daily    atorvastatin (LIPITOR) 40 MG tablet Take 1 tablet (40 mg total) by mouth nightly    carvedilol (COREG) 25 MG tablet Take 1 tablet (25 mg total) by mouth 2 (two) times daily with meals    empagliflozin (JARDIANCE) 10 MG tablet Take 1 tablet (10 mg total) by  mouth daily    fluticasone-salmeterol (ADVAIR DISKUS) 100-50 MCG/ACT Aerosol Pwdr, Breath Activated Inhale 1 puff into the lungs 2 (two) times daily    glucose blood (ONE TOUCH ULTRA TEST) test strip 1 each by Other route 2 (two) times daily Use as instructed    hydrocodone-chlorpheniramine (TUSSIONEX) 10-8 MG/5ML extended-release suspension Take 5 mLs by mouth 2 (two) times daily    insulin glargine (LANTUS SOLOSTAR) 100 UNIT/ML injection pen Patient takes 50 units at night    Insulin Pen Needle (BD Pen Needle Nano U/F) 32G X 4 MM Misc Use as directed bid    metOLazone (ZAROXOLYN) 2.5 MG tablet Take 1 tablet (2.5 mg total) by mouth once a week    niacin (NIASPAN) 500 MG CR tablet TAKE 1 TABLET BY MOUTH TWICE DAILY    PARoxetine (Paxil) 20 MG tablet Take 1 tablet (20 mg total) by mouth every morning    sacubitril-valsartan (Entresto) 97-103 MG Tab per tablet Take 1 tablet by mouth 2 (two) times daily    Semglee, yfgn, 100 UNIT/ML Solution Pen-injector ADMINISTER 50 UNITS UNDER THE SKIN AT BEDTIME    SITagliptin-metFORMIN (Janumet) 50-500 MG tablet Take 1 tablet by mouth 2 (two) times daily with meals    spironolactone (ALDACTONE) 25 MG tablet Take 0.5 tablets (12.5 mg total) by mouth daily    torsemide (Demadex) 20 MG tablet Take 2 tablets (40 mg) by mouth every morning AND 1 tablet (20 mg) every evening.         Current Facility-Administered Medications   Medication Dose Route Frequency Provider Last Rate Last Admin    acetaminophen (TYLENOL) tablet 650 mg  650 mg Oral Q6H PRN Rayburn Ma, MD   650 mg at 03/10/22 1438    aspirin EC tablet 81 mg  81 mg Oral Daily Hagjoo, Nazanin, NP   81 mg at 03/11/22 0901    atorvastatin (LIPITOR) tablet 40 mg  40 mg Oral QHS Hagjoo, Nazanin, NP   40 mg at 03/10/22 2233    carvedilol (COREG) tablet 25 mg  25 mg Oral BID Meals Muntzer, Gerome Apley, PA   25 mg at 03/09/22 1853    dextrose (GLUCOSE) 40 % oral gel 15 g of glucose  15 g of glucose Oral PRN Hagjoo, Nazanin, NP         Or    dextrose (D10W) 10% bolus 125 mL  12.5 g Intravenous PRN Hagjoo, Nazanin, NP        Or    dextrose 50 % bolus 12.5 g  12.5 g Intravenous PRN Hagjoo, Nazanin, NP        Or    glucagon (rDNA) (GLUCAGEN) injection 1 mg  1 mg Intramuscular PRN Hagjoo, Nazanin, NP        enoxaparin (LOVENOX) syringe 40 mg  40 mg Subcutaneous Q24H SCH Hagjoo, Nazanin, NP   40 mg at 03/11/22 0901    fluticasone furoate-vilanterol (BREO ELLIPTA) 100-25 MCG/ACT 1 puff  1  puff Inhalation QAM [place], Matthew, DO        furosemide (LASIX) injection 80 mg  80 mg Intravenous BID Hagjoo, Nazanin, NP   80 mg at 03/11/22 0901    insulin glargine (LANTUS) injection 20 Units  20 Units Subcutaneous QAM Hagjoo, Nazanin, NP   20 Units at 03/11/22 0901    insulin lispro injection 1-3 Units  1-3 Units Subcutaneous QHS Hagjoo, Nazanin, NP        insulin lispro injection 1-5 Units  1-5 Units Subcutaneous TID AC Hagjoo, Nazanin, NP   1 Units at 03/10/22 1726    metOLazone (ZAROXOLYN) tablet 2.5 mg  2.5 mg Oral Weekly Hagjoo, Nazanin, NP   2.5 mg at 03/09/22 1610    niacin (NIASPAN) CR tablet 500 mg  500 mg Oral BID Hagjoo, Nazanin, NP   500 mg at 03/11/22 0901    PARoxetine (PAXIL) tablet 20 mg  20 mg Oral QAM Hagjoo, Nazanin, NP   20 mg at 03/11/22 0902    sacubitril-valsartan (ENTRESTO) 97-103 MG per tablet 1 tablet  1 tablet Oral BID Muntzer, Gerome Apley, PA   1 tablet at 03/11/22 0901    spironolactone (ALDACTONE) tablet 12.5 mg  12.5 mg Oral Daily Muntzer, Gerome Apley, PA   12.5 mg at 03/09/22 1854         Review of Systems:    Comprehensive review of systems including constitutional, eyes, ears, nose, mouth, throat, cardiovascular, GI, GU, musculoskeletal, integumentary, respiratory, neurologic, psychiatric, and endocrine is negative other than what is mentioned already in the history of present illness    Physical Exam:     Vitals:    03/11/22 0846   BP: 91/62   Pulse: 97   Resp:    Temp:    SpO2:      Temp (24hrs), Avg:97.5 F (36.4 C), Min:97 F  (36.1 C), Max:98 F (36.7 C)      Intake and Output Summary (Last 24 hours) at Date Time    Intake/Output Summary (Last 24 hours) at 03/11/2022 0949  Last data filed at 03/11/2022 0615  Gross per 24 hour   Intake 1390 ml   Output 1850 ml   Net -460 ml       GENERAL: Patient is in no acute distress   HEENT: Moist mucous membranes   NECK: No jugular venous distention  CARDIAC: Regular rate and rhythm, with normal S1 and S2, and no murmurs,  CHEST: Clear to auscultation bilaterally, normal respiratory effort  ABDOMEN: Soft, nontender, non-distended, good bowel sounds   EXTREMITIES: +1 pitting LE edema, +scrotal edema, 2+ radial pulses palpable   SKIN: No rash or jaundice   NEUROLOGIC: Alert and oriented to time, place and person, normal mood and affect    MUSCULOSKELETAL: Normal muscle strength and tone.      Labs Reviewed:     Recent Labs   Lab 03/09/22  1740 03/09/22  1457   hs Troponin-I 19.9 18.6   hs Troponin-I Delta 1.3  --              Recent Labs   Lab 03/11/22  0611 03/10/22  0604   Bilirubin, Total 1.2 1.1   Bilirubin Direct  --  0.4   Protein, Total 5.4* 5.3*   Albumin 2.6* 2.8*   ALT 8 9   AST (SGOT) 8 9     Recent Labs   Lab 03/11/22  0611   Magnesium 1.9         Recent  Labs   Lab 03/11/22  0611 03/10/22  0604 03/09/22  1457   WBC 27.59* 23.77* 29.43*   Hgb 11.4* 10.7* 11.2*   Hematocrit 34.2* 32.5* 34.3*   Platelets 151 137* 142     Recent Labs   Lab 03/11/22  0611 03/10/22  0604 03/09/22  1457   Sodium 136 138 139   Potassium 4.1 4.1 4.1   Chloride 104 106 107   CO2 20 20 23    BUN 41.0* 36.0* 32.0*   Creatinine 1.8* 1.7* 1.7*   eGFR 38.5* 41.2* 41.2*   Glucose 164* 166* 102*   Calcium 8.2 8.3 8.5         Radiology   Radiological Procedure reviewed.      chest X-ray  Assessment:   Recent admission for acute on chronic systolic CHF and scrotal edema  EF 25 to 30% by echo August 2022.  Moderate tricuspid and mitral regurgitation present.  PA pressures were 54 mmHg.   Nonischemic cardiomyopathy with EF as  low as 15% May 2021, now up to 25 to 30% by echo August 2022  Negative cardiac catheterization for coronary disease 2014  Medtronic ICD in place  Diabetes  Hypertension      Recommendations:   Continue IV diuresis with lasix 80mg  BID. He still has lingering scrotal edema and mild lower extremity edema   Continue GDMT- Coreg, Entresto, Aldactone         Signed by: Conception Oms, NP      [facility]    I have personally interviewed and examined the patient.  I have reviewed the provider's history, exam, assessment and management plans. I concur with or have edited all elements of the provider's note.      GENERAL: Patient is in no acute distress   HEENT: Moist mucous membranes   NECK: No jugular venous distention  CARDIAC: Regular rate and rhythm, with normal S1 and S2, and no murmurs,  CHEST: Clear to auscultation bilaterally, normal respiratory effort  ABDOMEN: Soft, nontender, non-distended, good bowel sounds   EXTREMITIES: +1 pitting LE edema, +scrotal edema, 2+ radial pulses palpable   SKIN: No rash or jaundice   NEUROLOGIC: Alert and oriented to time, place and person, normal mood and affect    MUSCULOSKELETAL: Normal muscle strength and tone.    Jarrett Soho, MD [facility]  Advanced HF and Transplant Consult Attending    Greater than 45 minutes were spent in preparation and face to face with the patient counseling regarding, but not limited to, the results and implications of diagnostic testing, potential additional testing options, explaining the current diagnoses, educating the patient on proper management of non-ischemic cardiomyopathy including adherence, and discussing prognosis, as well as with coordination of care.

## 2022-03-11 NOTE — Progress Notes (Signed)
[facility]PITAL[facility] Progress Note  Patient Info:   Date/Time: 03/11/2022 / 9:17 AM   Admit Date:03/09/2022  Patient Name:[name] [name]   ZOX:[id number]   PCP: [name] [name]  Attending Physician:[name] [name], [name] [name]  Assessment/Plan:   76 year old male with known history of systolic congestive heart failure nonischemic cardiomyopathy status post AICD placement Medtronic follows by [facility] as outpatient, hypertension, nonobstructive CAD, hyperlipidemia, depression and anxiety, COPD and   sleep apnea multiple other comorbidities including diabetes type 2 insulin-dependent admitted with a chief complaints of 2 weeks of weight gain and progressively worsening shortness of breath at baseline and orthopnea and progressively worsening scrotal edema          Weight gain and scrotal edema 2/2 Acute On chronic Systolic and diastolic dysfunctions with EF of 25-30 % by ECHO in 05/2021.  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  Coreg.  Entresto  Aldactone  D/w cardiology APP         13 beats of vtach on 5.23.23. 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  Chest x-ray without any pneumonia.    Ultrasound of the scrotum and testicles with Doppler on 03/06/2022 showed moderate size complex left hydrocele with septation no hyperemia or mass scrotal wall edema without fluid collection  CT abdomen  pelvis 5.24.23  1. Retroperitoneal, inguinal and mesenteric adenopathy. Differential possibilities include lymphoma or metastatic disease. Percutaneous biopsy is recommended.  2. Moderate scrotal wall thickening.  As per Dr [name] [name] '' this CT  this is expected with CLL' no additional recs from onc team except R/o infection   Perinephric stranding on CT but negative UA.  Monitor on IV zosyn .  ID consult requested w Dr [name] [name] .  D/w Dr [name] [name] .           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    Today creatinine of 1.8  Continue to monitor renal function closely  while on IV diuresis   avoid nephrotoxic medications     Coronary artery disease nonocclusive   currently on Coreg, Lipitor, aspirin     Anxiety and depression,   currently on Paxil 20 mg daily.    STABLE MOOD      COPD and sleep apnea,   no signs of acute respiratory wheezing or shortness of breath cough, currently on Breo and DuoNebs as needed     Hyperlipidemia,   currently on Lipitor.        Patient has BMI=Body mass index is 29.7 kg/m.  Diagnosis: Overweight based on BMI criteria       DVT Prophylaxis:Medication VTE Prophylaxis Orders: enoxaparin (LOVENOX) syringe 40 mg, Reason for no VTE prophylaxis meds -     Mechanical VTE Prophylaxis Orders: Maintain sequential compression device  Central Line/Foley Catheter/PICC line status: None  Code Status: Full Code  Disposition: Pending clinical improvement.  Continue IV diuretic therapy  Cardiology FOLLOW UP   iD consult to r/o inf   Ambulate the patient.  Close monitoring of volume status.  Daily labs.  Type of Admission:Inpatient  Prognosis guarded .    I called the wife  [name] [name] [phone] and she did not answer.  Her mailbox is full so could not leave a message.  Encourage the patient to ask her if she has any question for the providers to answer.        Hospital Problems:     Active Hospital Problems    Diagnosis     Acute on chronic congestive heart failure, unspecified heart failure type     Subjective:     SOB improving   No fever or chills.  Scrotal swelling improving.  No testicular pain at this point        Chief Complaint:on admission   Shortness of Breath, Groin Swelling, and Leg Swelling      Objective:     Vitals:    03/11/22 0441 03/11/22 0445 03/11/22 0740 03/11/22 0846   BP: 100/49  (!) 108/95 91/62   Pulse: 84  89 97   Resp: 18  18    Temp: 97.6 F (36.4 C)  97.8 F (36.6 C)    TempSrc: Temporal  Temporal    SpO2: 96%  95%    Weight:  96.3 kg (212 lb 4.8 oz)     Height:         Physical Exam:   Physical Exam  Constitutional:       Appearance: He is ill-appearing.   HENT:      Head: Normocephalic and atraumatic.      Mouth/Throat:      Mouth: Mucous membranes are moist.   Eyes:      Pupils: Pupils are equal, round, and reactive to light.   Cardiovascular:      Rate and Rhythm: Normal rate and regular rhythm.   Pulmonary:      Effort: Pulmonary effort is normal.      Breath sounds: Normal breath sounds.   Abdominal:      General: There is no distension.      Palpations: Abdomen is soft.      Tenderness: There is no abdominal tenderness.   Genitourinary:     Comments: Scrotal edema severe.  Lymphedema  Inguinal lymphadenopathy but nontender  Musculoskeletal:      Right lower leg: Edema present.      Left lower leg: Edema present.   Skin:     General: Skin is warm and dry.   Neurological:      General: No focal deficit present.      Mental Status: He is alert.   Psychiatric:         Mood and Affect: Mood normal.         Behavior: Behavior normal.             Results of Labs/imaging   All the results from labs and images within the last 24 hours are reviewed by me.  Hospitalist   Signed by:   Yamilet Mcfayden Otho Ket, Jozy Mcphearson  03/11/2022 9:17 AM      *This note was generated by the Epic EMR system/ Dragon speech recognition and may contain inherent errors or omissions not intended by the user. Grammatical errors, random word  insertions, deletions, pronoun errors and incomplete  sentences are occasional consequences of this technology due to software limitations. Not all errors are caught or corrected. If there are questions or concerns about the content of this note or information contained within the body of this dictation they should be addressed directly with the author for clarification

## 2022-03-11 NOTE — Plan of Care (Signed)
Problem: Safety  Goal: Patient will be free from injury during hospitalization  Flowsheets (Taken 03/11/2022 0026)  Patient will be free from injury during hospitalization:   Assess patient's risk for falls and implement fall prevention plan of care per policy   Provide and maintain safe environment   Ensure appropriate safety devices are available at the bedside   Use appropriate transfer methods   Hourly rounding   Include patient/ family/ care giver in decisions related to safety     Problem: Pain  Goal: Pain at adequate level as identified by patient  Flowsheets (Taken 03/09/2022 1834 by Clifton Custard, RN)  Pain at adequate level as identified by patient:   Identify patient comfort function goal   Assess for risk of opioid induced respiratory depression, including snoring/sleep apnea. Alert healthcare team of risk factors identified.   Assess pain on admission, during daily assessment and/or before any "as needed" intervention(s)   Reassess pain within 30-60 minutes of any procedure/intervention, per Pain Assessment, Intervention, Reassessment (AIR) Cycle   Evaluate if patient comfort function goal is met   Evaluate patient's satisfaction with pain management progress   Offer non-pharmacological pain management interventions   Consult/collaborate with Pain Service   Consult/collaborate with Physical Therapy, Occupational Therapy, and/or Speech Therapy   Include patient/patient care companion in decisions related to pain management as needed     Problem: Everyday - Heart Failure  Goal: Stable Vital Signs and Fluid Balance  Outcome: Progressing  Flowsheets (Taken 03/11/2022 0026)  Stable Vital Signs and Fluid Balance:   Daily Standing Weights in the morning using the same scale, after using the bathroom and before breadfast.  If unable to stand, zero the bed and use the bed scale   Monitor, assess vital signs and telemetry per policy   Strict Intake/Output   Fluid Restriction   Assess for swelling/edema   Monitor  labs and report abnormalities to physician   Wean oxygen as needed if appropriate     Problem: Everyday - Heart Failure  Goal: Mobility/Activity is Maintained at Optimal Level for Patient  Outcome: Progressing  Flowsheets (Taken 03/10/2022 1014 by Theodoro Doing, RN)  Mobility/Activity is Maintained at Optimal Level for Patient:   Increase mobility as tolerated/progressive mobility protocol   Maintain SCD's as Ordered   Perform active/passive ROM   Reposition patient every 2 hours and as needed unless able to reposition self   Assess for changes in respiratory status, level of consciousness and/or development of fatigue   Consult with Physical Therapy and/or Occupational Therapy     Problem: Everyday - Heart Failure  Goal: Nutritional Intake is Adequate  Outcome: Progressing  Flowsheets (Taken 03/11/2022 0026)  Nutritional Intake is Adequate:   Cardiac diet-2 gm Sodium   Patient and family teaching on low sodium diet   Encourage/perform oral hygiene as appropriate   Fluid Restricction if needed   Assess appetite,anorexia and amount of meal/food tolerated   Consult/Collaborate with Nutritionist     Problem: Everyday - Heart Failure  Goal: Teaching-Using CHF Warning Zones and Educational Videos  Flowsheets (Taken 03/10/2022 1014 by Theodoro Doing, RN)  Teaching-Using CHF Warning Zones and Educational Videos:   Signs & Symptoms of CHF   Daily Standing Weights & record   CHF Warning Zones and when to call for help   Sodium Restriction   Fluid Restriction if appropriate   Document in the Education Tab in EPIC with Teach-back

## 2022-03-11 NOTE — Consults (Signed)
INFECTIOUS DISEASE CONSULT  Alfonzo Beers, MD, FACP          Date Time: 03/11/22 1:03 PM  Patient Name: [name] [name]  Referring Physician: Bobbye Charleston, MD      Reason for consult:     Retroperitoneal and mesenteric lymphadenopathy, leukocytosis; Possible osteomyelitis    History of present illness:     Iman Orourke EVO:[id number] is a 76 y.o. male, with history significant for diabetes mellitus, hypertension, chronic kidney disease, anxiety, depression, sleep apnea, COPD, hyperlipidemia, congestive heart failure, nonischemic cardiomyopathy, status post AICD placement, admitted with 2 weeks history of worsening weakness, malaise, fatigue, weight gain, shortness of breath and his imaging is consistent with left-sided hydrocele as well as perinephric stranding and significant retroperitoneal, Gwendolyn mesenteric lymphadenopathy.  According to Dr. Minus Liberty he was recently diagnosed with CLL and being followed by hematology oncology as an outpatient.  Denies any hematemesis, hemoptysis, melena.    Review of systems:     Constitutional: Complains of malaise and fatigue.   HEENT: Denies any hearing loss, ear pain, nosebleeds, congestion, sore throat, neck pain, tinnitus and ear discharge. Denies any blurred vision, double vision, photophobia, pain, discharge and redness.    Respiratory: Complains of shortness of breath, cough.     Cardiovascular: Complains of shortness of breath, leg swelling.    Gastrointestinal: Denies any heartburn, nausea, vomiting, abdominal pain, diarrhea, constipation, blood in stool and melena.    Genitourinary: Complains of scrotal swelling, tenderness.    Musculoskeletal: Denies any myalgias, back pain, joint pain and falls.    Skin: Denies any itching and rash.   Neurological: Denies any dizziness, tingling, tremors, sensory change, speech change, focal weakness, seizures, loss of consciousness, weakness and headaches.    Endo/Heme/Allergies: Denies any  environmental allergies and polydipsia. Does not bruise/bleed easily.    Psychiatric/Behavioral: History of depression.   Other review of systems are noncontributory.     Allergies:     No Known Allergies    Past medical history:     Past Medical History:   Diagnosis Date    Acute CHF     NOV  & DEC 2018, - DIALYSIS CATH PLACED Aug 24 2017 TO REMOVE FLUID     Acute systolic (congestive) heart failure 08/2017    Anxiety     CAD (coronary artery disease)     Cardiomyopathy     nonischemic    CHF (congestive heart failure) 08/12/2014, 2013    Chronic obstructive pulmonary disease     POSSIBLE PER PT HE USES  SYNBICORT BID ABD  BREO INHALER PRN    Coronary artery disease 2011    CORONARY STENT PLACEMENT FOLLOWED BY [facility]    Depression     echocardiogram 02/2016, 11/2016, 09/2017, 12/20/2017    GERD (gastroesophageal reflux disease)     Heart attack 2011    and 2014    Hyperlipemia     Hypertension     ICD (implantable 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     PLACED 2014  [facility]  LAST INTERROGATION April 01 2018 REPORT REQUESTED    Ischemic cardiomyopathy     EF 15% ON ECHO 09-20-2017 DR GARG IN EPIC    Nonischemic cardiomyopathy     NSTEMI (non-ST elevated myocardial infarction)     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    Pacemaker 2014    MEDTRONIC ICD/PACEMAKER COMBO LAST INTERROGATION  12-12 2018    Pneumonia 09/2016    Primary cardiomyopathy     Ischemic cardiomyopathy EF  15% ON ECHO 09-20-2017 DR GARG IN EPIC    Sepsis 08/2017    Syncope and collapse     PRIOR TO ICD/PACEMAKER PLACED 2014    Type 2 diabetes mellitus, controlled DX 1998    BS  AVG 100  A1C 7.4 Dec 27 2017    [facility]     PT SEE HIS PMD April 01 2018 RE THIS-TOLD TO SEE PMD BY [facility] JUNE 12 WHEN THEY SAW HIM FOR A CL       Past surgical history:     Past Surgical History:   Procedure Laterality Date    BIV  11/10/2016    ICD METRONIC  UPGRADE     CARDIAC CATHETERIZATION  03/2010    CORONARY STENT PLACEMENT X 2 PER PT; LM normal, LAD 70-80% distal lesion at apex, 20%  lesion mid CFX    CARDIAC CATHETERIZATION  12/2012    CARDIAC DEFIBRILLATOR PLACEMENT  12/2012    [facility]    CARDIAC PACEMAKER PLACEMENT  2014    MEDTRONIC ICD/PACEMAKER PLACED  HEART    CIRCUMCISION  AGE 48    COLONOSCOPY  2009    CORRECTION HAMMER TOE      duodenal ulcer  1973    STRESS RELATED IN SCHOOL    ECHOCARDIOGRAM, TRANSTHORACIC  02/2016 11/2016 09/2017 12/2017    ECHOCARDIOGRAM, TRANSTHORACIC  02/2016,11/2016,12/20/2017    EGD N/A 08/15/2014    Procedure: EGD;  Surgeon: Colon Branch, MD;  Location: [facility];  Service: Gastroenterology;  Laterality: N/A;  egd w/ bx    EGD  1975    EGD, COLONOSCOPY N/A 04/04/2018    Procedure: EGD with bxs, COLONOSCOPY with polypectomy and clipping;  Surgeon: Doyne Keel, MD;  Location: [facility];  Service: Gastroenterology;  Laterality: N/A;    FRACTURE SURGERY  AGE 89     RT  ANKLE- CAST APPLIED    HERNIA REPAIR  AGE 48    LEFT INGUINAL HERNIA    MPI nuclear study  06/2016    orthopedic surgery  AGE 67    RT foot corrective - HAMMERTOE    TONSILLECTOMY AND ADENOIDECTOMY  1954       Family history:     Family History   Problem Relation Age of Onset    Breast cancer Mother     Heart attack Mother 32    Hypertension Mother     Diabetes Mother     Coronary artery disease Mother     Diabetes Brother     Heart attack Father     Hypertension Father        Social history:     Social History     Substance and Sexual Activity   Alcohol Use No    Alcohol/week: 0.0 standard drinks of alcohol    Comment: occasionally     Social History     Substance and Sexual Activity   Drug Use No     Social History     Tobacco Use   Smoking Status Never   Smokeless Tobacco Never   Vaping Use   Vaping Status Never Used       Medications:     Current Facility-Administered Medications   Medication Dose Route Frequency    aspirin EC  81 mg Oral Daily    atorvastatin  40 mg Oral QHS    carvedilol  25 mg Oral BID Meals    enoxaparin  40 mg Subcutaneous Q24H [facility]  fluticasone furoate-vilanterol  1 puff Inhalation QAM    furosemide  80 mg Intravenous BID    insulin glargine  20 Units Subcutaneous QAM    insulin lispro  1-3 Units Subcutaneous QHS    insulin lispro  1-5 Units Subcutaneous TID AC    metOLazone  2.5 mg Oral Weekly    niacin  500 mg Oral BID    PARoxetine  20 mg Oral QAM    piperacillin-tazobactam  4.5 g Intravenous Q8H SCH    sacubitril-valsartan  1 tablet Oral BID    spironolactone  12.5 mg Oral Daily       Physical Exam:     Blood pressure (!) 88/66, pulse 97, temperature 97.5 F (36.4 C), temperature source Temporal, resp. rate 17, height 1.829 m (6'), weight 96.3 kg (212 lb 4.8 oz), SpO2 97 %.    General Appearance: Sick looking, in no acute distress.    HEENT: Pallor negative, Anicteric sclera.    Neck:    Supple  Lungs: Decreased breath sound at bases.    Chest Wall: Symmetric chest wall expansion.   Heart : S1 and S2.   Abdomen: Abdomen is soft, bowel sounds positive.  Neurological: Alert and oriented to person, place and time.  Moves all extremities  Extremities: Lower extremity edema  Skin/scrotum: Scrotal swelling and tenderness  Psychiatric: Mood and affect is normal    Labs:     Recent Labs     03/11/22  0611 03/10/22  0604   WBC 27.59* 23.77*   Hgb 11.4* 10.7*   Hematocrit 34.2* 32.5*   Platelets 151 137*   MCV 102.4* 103.2*       Recent Labs     03/11/22  0611 03/10/22  0604   Sodium 136 138   Potassium 4.1 4.1   Chloride 104 106   CO2 20 20   BUN 41.0* 36.0*   Creatinine 1.8* 1.7*   Glucose 164* 166*   Calcium 8.2 8.3   Magnesium 1.9 1.8   Phosphorus 3.7  --        Recent Labs     03/11/22  0611 03/10/22  0604   AST (SGOT) 8 9   ALT 8 9   Alkaline Phosphatase 65 64   Protein, Total 5.4* 5.3*   Albumin 2.6* 2.8*   Bilirubin, Total 1.2 1.1       Imaging studies:     CT: 1. Retroperitoneal, inguinal and mesenteric adenopathy. Differential possibilities include lymphoma or metastatic disease. Percutaneous biopsy is recommended.   2. Moderate scrotal  wall thickening.     Assessment :     [name] [name] is a 76 y.o. male, with:    Perinephric stranding; pyelonephritis?  Retroperitoneal, inguinal, mesenteric lymphadenopathy  CLL  Congestive heart failure  Cardiomyopathy  S/p ICD placement  Diabetes mellitus  Left hydrocele  Chronic kidney disease  Hypertension  Coronary artery disease  Chronic obstructive pulmonary disease  Hyperlipidemia  Sleep apnea  Depression    Recommendations:     I would like to suggest following approach:    Zosyn 4.5 g IV every 8 hours for broader coverage monitor thrombocytopenia closely  Consider biopsy, if its not ready done as an out patient  Hematology oncology follow-up  Consider urology evaluation  Cardiology follow-up  Correction of electrolytes  We will follow cultures  Repeat blood cultures if spikes more than 100.5   Discussed with treatment team  CBC, CMP tomorrow  We'll adjust the antimicrobials according to the  cultures and clinical course     I will follow this patient closely with you    Thank you Dr. Minus Liberty for involving me in care of [name]          Signed by: Alfonzo Beers, MD, MD, FACP  Date Time: 03/11/22 1:03 PM      *This note was generated by the Epic EMR system/ Dragon speech recognition and may contain inherent errors or omissions not intended by the user. Grammatical errors, random word insertions, deletions, pronoun errors and incomplete sentences are occasional consequences of this technology due to software limitations. Not all errors are caught or corrected. If there are questions or concerns about the content of this note or information contained within the body of this dictation they should be addressed directly with the author for clarification

## 2022-03-11 NOTE — Consults (Signed)
INPATIENT CONSULTATION NOTE    Date Time: 03/11/22 1:47 PM  Patient Name: [name] [name]  Requesting Physician: Bobbye Charleston, MD      Reason for Consultation:   Evaluation of patient followed by [facility]    Assessment/Plan:   Thank you for allowing me to participate in the care of this patient.  I recommend the following.    #CLL, RAI stage 0  -Not on current therapy, which is not indicated at this time  -Leukocytosis secondary to CLL, patient's white blood cell count is at baseline compared to office studies  -Imaging reviewed, lymphadenopathy consistent with CLL  -No further work-up from our standpoint      I had the opportunity to discuss the case with Dr Minus Liberty  Rounded with Dr Lacey Jensen  We will follow along.  Please reach out with questions    History:   [name] [name] is a 76 y.o. male who presents to the hospital on 03/09/2022 with     Principal Problem:    Acute on chronic congestive heart failure, unspecified heart failure type    Pt is followed by Dr Laurine Blazer with a history of Rai stage 0 CLL, diagnosed in 2021  Pt is not on treatment, but surveillance with Q13mo visits.   Most recent labs in office 01/19/2022 reveal: wbc 38, Hgb 12.3, PLT 160, MCV 107    Patient presents to the with progressively worsening shortness of breath, orthopnea, and scrotal edema.  Patient with recent weight gain.  Patient was found with acute on chronic systolic and diastolic dysfunction.  Patient noted with leukocytosis and adenopathy on imaging.  Hematology for further recommendations      Past Medical History:     Past Medical History:   Diagnosis Date    Acute CHF     NOV  & DEC 2018, - DIALYSIS CATH PLACED Aug 24 2017 TO REMOVE FLUID     Acute systolic (congestive) heart failure 08/2017    Anxiety     CAD (coronary artery disease)     Cardiomyopathy     nonischemic    CHF (congestive heart failure) 08/12/2014, 2013    Chronic obstructive pulmonary disease     POSSIBLE PER PT HE USES  SYNBICORT BID ABD  BREO  INHALER PRN    Coronary artery disease 2011    CORONARY STENT PLACEMENT FOLLOWED BY [facility]    Depression     echocardiogram 02/2016, 11/2016, 09/2017, 12/20/2017    GERD (gastroesophageal reflux disease)     Heart attack 2011    and 2014    Hyperlipemia     Hypertension     ICD (implantable 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     PLACED 2014  [facility]  LAST INTERROGATION April 01 2018 REPORT REQUESTED    Ischemic cardiomyopathy     EF 15% ON ECHO 09-20-2017 DR GARG IN EPIC    Nonischemic cardiomyopathy     NSTEMI (non-ST elevated myocardial infarction)     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    Pacemaker 2014    [employer]TR[employer]IC ICD/PACEMAKER COMBO LAST INTERROGATION  12-12 2018    Pneumonia 09/2016    Primary cardiomyopathy     Ischemic cardiomyopathy EF 15% ON ECHO 09-20-2017 DR GARG IN EPIC    Sepsis 08/2017    Syncope and collapse     PRIOR TO ICD/PACEMAKER PLACED 2014    Type 2 diabetes mellitus, controlled DX 1998    BS  AVG 100  A1C 7.4 Dec 27 2017  [facility]     PT SEE HIS PMD April 01 2018 RE THIS-TOLD TO SEE PMD BY [facility] JUNE 12 WHEN THEY SAW HIM FOR A CL       Past Surgical History:     Past Surgical History:   Procedure Laterality Date    BIV  11/10/2016    ICD METRONIC  UPGRADE     CARDIAC CATHETERIZATION  03/2010    CORONARY STENT PLACEMENT X 2 PER PT; LM normal, LAD 70-80% distal lesion at apex, 20% lesion mid CFX    CARDIAC CATHETERIZATION  12/2012    CARDIAC DEFIBRILLATOR PLACEMENT  12/2012    [facility]    CARDIAC PACEMAKER PLACEMENT  2014    MEDTRONIC ICD/PACEMAKER PLACED [facility]    CIRCUMCISION  AGE 69    COLONOSCOPY  2009    CORRECTION HAMMER TOE      duodenal ulcer  1973    STRESS RELATED IN SCHOOL    ECHOCARDIOGRAM, TRANSTHORACIC  02/2016 11/2016 09/2017 12/2017    ECHOCARDIOGRAM, TRANSTHORACIC  02/2016,11/2016,12/20/2017    EGD N/A 08/15/2014    Procedure: EGD;  Surgeon: Colon Branch, MD;  Location: [facility];  Service: Gastroenterology;  Laterality: N/A;  egd w/ bx    EGD  1975    EGD, COLONOSCOPY N/A  04/04/2018    Procedure: EGD with bxs, COLONOSCOPY with polypectomy and clipping;  Surgeon: Doyne Keel, MD;  Location: [facility];  Service: Gastroenterology;  Laterality: N/A;    FRACTURE SURGERY  AGE 2     RT  ANKLE- CAST APPLIED    HERNIA REPAIR  AGE 69    LEFT INGUINAL HERNIA    MPI nuclear study  06/2016    orthopedic surgery  AGE 89    RT foot corrective - HAMMERTOE    TONSILLECTOMY AND ADENOIDECTOMY  1954       Family History:     Family History   Problem Relation Age of Onset    Breast cancer Mother     Heart attack Mother 6    Hypertension Mother     Diabetes Mother     Coronary artery disease Mother     Diabetes Brother     Heart attack Father     Hypertension Father        Social History:     Social History     Socioeconomic History    Marital status: Married     Spouse name: Teacher, music    Number of children: 0    Years of education: Not on file    Highest education level: Not on file   Occupational History    Occupation: Runner, 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, [place]    Tobacco Use    Smoking status: Never    Smokeless tobacco: Never   Vaping Use    Vaping status: Never Used   Substance and Sexual Activity    Alcohol use: No     Alcohol/week: 0.0 standard drinks of alcohol     Comment: occasionally    Drug use: No    Sexual activity: Yes     Partners: Female   Other Topics Concern    Not on file   Social History Narrative    Not on file     Social Determinants of Health     Financial Resource Strain: Not on file   Food Insecurity: Not on file   Transportation Needs: Not on file   Physical Activity: Not on file  Stress: Not on file   Social Connections: Not on file   Intimate Partner Violence: Not on file   Housing Stability: Not on file       Allergies:   No Known Allergies    Medications:     Current Facility-Administered Medications   Medication Dose Route Frequency    aspirin EC  81 mg Oral Daily    atorvastatin  40 mg Oral QHS    carvedilol  25 mg Oral BID Meals    enoxaparin  40 mg Subcutaneous Q24H  SCH    fluticasone furoate-vilanterol  1 puff Inhalation QAM    furosemide  80 mg Intravenous BID    insulin glargine  20 Units Subcutaneous QAM    insulin lispro  1-3 Units Subcutaneous QHS    insulin lispro  1-5 Units Subcutaneous TID AC    metOLazone  2.5 mg Oral Weekly    niacin  500 mg Oral BID    PARoxetine  20 mg Oral QAM    piperacillin-tazobactam  4.5 g Intravenous Q8H SCH    sacubitril-valsartan  1 tablet Oral BID    spironolactone  12.5 mg Oral Daily       Review of Systems:     General ROS: negative for - fever, night sweats, positive weight gain  Psychological ROS: negative for - depression  ENT ROS: negative for - epistaxis, headaches or sore throat  Respiratory ROS: no cough, shortness of breath  Cardiovascular ROS: no exertional chest pressure or dyspnea on exertion  Gastrointestinal ROS: no abdominal pain, change in bowel habits, or black or bloody stools  Genito-Urinary ROS: no dysuria or hematuria  Musculoskeletal ROS: Positive for edema neurological ROS: negative for - dizziness, headaches, visual changes or focal weakness  Dermatological ROS: negative for rash and skin lesion changes      Physical Exam:     Vitals:    03/11/22 1220   BP: (!) 88/66   Pulse: 97   Resp: 17   Temp: 97.5 F (36.4 C)   SpO2: 97%     GENERAL:  Alert.  No acute distress.  PSYCHIATRIC:   Mood good.  Affect normal.  HEENT  Sclerae anicteric.  Oropharynx shows no masses, ulcers, or thrush.  NECK:  Supple.    CHEST: Unlabored on room air  HEART:  Regular rate and rhythm.    ABDOMEN: Nondistended      Labs:     CBC with Diff.  Recent Labs   Lab 03/11/22  0611 03/10/22  0604 03/09/22  1457   WBC 27.59* 23.77* 29.43*   Hgb 11.4* 10.7* 11.2*   Hematocrit 34.2* 32.5* 34.3*   Platelets 151 137* 142   MCV 102.4* 103.2* 105.5*   RDW 14 15 15    Segmented Neutrophils 13  --  10       Chemistries   Recent Labs   Lab 03/11/22  0611 03/10/22  0604 03/09/22  1457   Sodium 136 138 139   Potassium 4.1 4.1 4.1   Chloride 104 106 107   CO2  20 20 23    BUN 41.0* 36.0* 32.0*   Creatinine 1.8* 1.7* 1.7*   Glucose 164* 166* 102*   Calcium 8.2 8.3 8.5   Magnesium 1.9 1.8  --    Phosphorus 3.7  --   --    Bilirubin, Total 1.2 1.1 0.8   AST (SGOT) 8 9 10    ALT 8 9 10    Alkaline Phosphatase 65 64 74  Coagulation Profile        Radiology Reports    Radiology Results (24 Hour)       Procedure Component Value Units Date/Time    CT Abdomen Pelvis WO IV/ WO PO Cont [[id number]] Collected: 03/11/22 6295    Order Status: Completed Updated: 03/11/22 0843    Narrative:      HISTORY:  Leukocytosis. Scrotal swelling.    COMPARISON: 02/17/2021.    TECHNIQUE: CT of the abdomen and pelvis performed without intravenous contrast. The following dose reduction techniques were utilized: automated exposure control and/or adjustment of the mA and/or KV according to patient size, and the use of an iterative   reconstruction technique.    FINDINGS:  Please note that evaluation of the viscera and vasculature is limited in the absence of IV contrast.     There is minimal atelectasis at the lung bases. Mild bronchiectasis present in the bilateral lower lobes.    No calcified gallstones. The liver, spleen, adrenal glands pancreas are unremarkable on this noncontrast study.    There is bilateral perinephric stranding. There is stranding along the right pelvic sidewall.. There is no hydronephrosis. No renal, ureteral bladder calculi are seen. Bilateral renal cysts are present.    There is marked retroperitoneal, pelvic and inguinal adenopathy. Moderate mesenteric adenopathy is present. Right pelvic sidewall lymph node measures 7.7 x 5.9 cm (series 7 image 133). Right inguinal lymph node measures 2.9 x 3.8 cm (series 4 image 167).   Mesenteric lymph node measures 4.0 x 1.7 cm (series 4 image 77).    Bladder is mildly distended. The prostate is mildly enlarged. There is moderate anasarca. There is moderate scrotal wall thickening. Degenerative changes are present in the lumbar spine.  Small bilateral fat-containing inguinal hernias.      Impression:        1. Retroperitoneal, inguinal and mesenteric adenopathy. Differential possibilities include lymphoma or metastatic disease. Percutaneous biopsy is recommended.  2. Moderate scrotal wall thickening.  3. Other findings as above.    Jasmine December D'Heureux, MD  03/11/2022 8:41 AM              [facility]   [address], [address]  [place], [place] [address]  [phone]      [email or web address]

## 2022-03-11 NOTE — Plan of Care (Signed)
Problem: Everyday - Heart Failure  Goal: Stable Vital Signs and Fluid Balance  Outcome: Progressing  Flowsheets (Taken 03/11/2022 1034)  Stable Vital Signs and Fluid Balance:   Daily Standing Weights in the morning using the same scale, after using the bathroom and before breadfast.  If unable to stand, zero the bed and use the bed scale   Monitor, assess vital signs and telemetry per policy   Monitor labs and report abnormalities to physician   Strict Intake/Output   Wean oxygen as needed if appropriate   Assess for swelling/edema   Fluid Restriction  Goal: Mobility/Activity is Maintained at Optimal Level for Patient  Outcome: Progressing  Flowsheets (Taken 03/11/2022 1034)  Mobility/Activity is Maintained at Optimal Level for Patient:   Increase mobility as tolerated/progressive mobility protocol   Reposition patient every 2 hours and as needed unless able to reposition self   Maintain SCD's as Ordered   Perform active/passive ROM   Assess for changes in respiratory status, level of consciousness and/or development of fatigue   Consult with Physical Therapy and/or Occupational Therapy  Goal: Nutritional Intake is Adequate  Outcome: Progressing  Flowsheets (Taken 03/11/2022 1034)  Nutritional Intake is Adequate:   Cardiac diet-2 gm Sodium   Fluid Restricction if needed   Consult/Collaborate with Nutritionist   Assess appetite,anorexia and amount of meal/food tolerated   Encourage/perform oral hygiene as appropriate   Patient and family teaching on low sodium diet  Goal: Teaching-Using CHF Warning Zones and Educational Videos  Outcome: Progressing  Flowsheets (Taken 03/11/2022 1034)  Teaching-Using CHF Warning Zones and Educational Videos:   Signs & Symptoms of CHF   Daily Standing Weights & record   CHF Warning Zones and when to call for help   Medications   Document in the Education Tab in EPIC with Teach-back   Fluid Restriction if appropriate   Sodium Restriction     Problem: Safety  Goal: Patient will be free  from injury during hospitalization  Outcome: Progressing  Flowsheets (Taken 03/11/2022 1034)  Patient will be free from injury during hospitalization:   Assess patient's risk for falls and implement fall prevention plan of care per policy   Use appropriate transfer methods   Ensure appropriate safety devices are available at the bedside   Provide and maintain safe environment   Hourly rounding   Include patient/ family/ care giver in decisions related to safety   Assess for patients risk for elopement and implement Elopement Risk Plan per policy  Goal: Patient will be free from infection during hospitalization  Outcome: Progressing  Flowsheets (Taken 03/11/2022 1034)  Free from Infection during hospitalization:   Assess and monitor for signs and symptoms of infection   Monitor lab/diagnostic results   Monitor all insertion sites (i.e. indwelling lines, tubes, urinary catheters, and drains)   Encourage patient and family to use good hand hygiene technique     Problem: Discharge Barriers  Goal: Patient will be discharged home or other facility with appropriate resources  Outcome: Progressing  Flowsheets (Taken 03/11/2022 1034)  Discharge to home or other facility with appropriate resources:   Provide appropriate patient education   Provide information on available health resources   Initiate discharge planning     Problem: Psychosocial and Spiritual Needs  Goal: Demonstrates ability to cope with hospitalization/illness  Outcome: Progressing  Flowsheets (Taken 03/11/2022 1034)  Demonstrates ability to cope with hospitalizations/illness:   Encourage verbalization of feelings/concerns/expectations   Assist patient to identify own strengths and abilities   Provide  quiet environment   Encourage patient to set small goals for self   Reinforce positive adaptation of new coping behaviors   Encourage participation in diversional activity   Include patient/ patient care companion in decisions     Problem: Moderate/High Fall Risk  Score >5  Goal: Patient will remain free of falls  Outcome: Progressing  Flowsheets (Taken 03/11/2022 1034)  Moderate Risk (6-13):   MOD-Remain with patient during toileting   MOD-Floor mat at bedside (where available) if appropriate   MOD-Consider activation of bed alarm if appropriate

## 2022-03-12 ENCOUNTER — Inpatient Hospital Stay: Payer: Commercial Managed Care - POS

## 2022-03-12 DIAGNOSIS — R0600 Dyspnea, unspecified: Secondary | ICD-10-CM

## 2022-03-12 LAB — CELL MORPHOLOGY
Cell Morphology: ABNORMAL — AB
Cell Morphology: ABNORMAL — AB
Platelet Estimate: NORMAL
Platelet Estimate: NORMAL

## 2022-03-12 LAB — COMPREHENSIVE METABOLIC PANEL
ALT: 9 U/L (ref 0–55)
AST (SGOT): 8 U/L (ref 5–41)
Albumin/Globulin Ratio: 1.1 (ref 0.9–2.2)
Albumin: 2.8 g/dL — ABNORMAL LOW (ref 3.5–5.0)
Alkaline Phosphatase: 65 U/L (ref 37–117)
Anion Gap: 14 (ref 5.0–15.0)
BUN: 51 mg/dL — ABNORMAL HIGH (ref 9.0–28.0)
Bilirubin, Total: 1.5 mg/dL — ABNORMAL HIGH (ref 0.2–1.2)
CO2: 21 mEq/L (ref 17–29)
Calcium: 8.3 mg/dL (ref 7.9–10.2)
Chloride: 100 mEq/L (ref 99–111)
Creatinine: 2.3 mg/dL — ABNORMAL HIGH (ref 0.5–1.5)
Globulin: 2.6 g/dL (ref 2.0–3.6)
Glucose: 267 mg/dL — ABNORMAL HIGH (ref 70–100)
Potassium: 4 mEq/L (ref 3.5–5.3)
Protein, Total: 5.4 g/dL — ABNORMAL LOW (ref 6.0–8.3)
Sodium: 135 mEq/L (ref 135–145)
eGFR: 28.6 mL/min/{1.73_m2} — AB (ref >=60)

## 2022-03-12 LAB — CBC AND DIFFERENTIAL
Absolute NRBC: 0 10*3/uL (ref 0.00–0.00)
Absolute NRBC: 0 10*3/uL (ref 0.00–0.00)
Hematocrit: 33.8 % — ABNORMAL LOW (ref 37.6–49.6)
Hematocrit: 32.8 % — ABNORMAL LOW (ref 37.6–49.6)
Hgb: 11.3 g/dL — ABNORMAL LOW (ref 12.5–17.1)
Hgb: 11 g/dL — ABNORMAL LOW (ref 12.5–17.1)
Instrument Absolute Neutrophil Count: 3.07 10*3/uL (ref 1.10–6.33)
Instrument Absolute Neutrophil Count: 3.88 10*3/uL (ref 1.10–6.33)
MCH: 34.5 pg — ABNORMAL HIGH (ref 25.1–33.5)
MCH: 34.4 pg — ABNORMAL HIGH (ref 25.1–33.5)
MCHC: 33.4 g/dL (ref 31.5–35.8)
MCHC: 33.5 g/dL (ref 31.5–35.8)
MCV: 103 fL — ABNORMAL HIGH (ref 78.0–96.0)
MCV: 102.5 fL — ABNORMAL HIGH (ref 78.0–96.0)
MPV: 12 fL (ref 8.9–12.5)
MPV: 11.9 fL (ref 8.9–12.5)
Nucleated RBC: 0 /100 WBC (ref 0.0–0.0)
Nucleated RBC: 0 /100 WBC (ref 0.0–0.0)
Platelets: 160 10*3/uL (ref 142–346)
Platelets: 155 10*3/uL (ref 142–346)
RBC: 3.28 10*6/uL — ABNORMAL LOW (ref 4.20–5.90)
RBC: 3.2 10*6/uL — ABNORMAL LOW (ref 4.20–5.90)
RDW: 14 % (ref 11–15)
RDW: 14 % (ref 11–15)
WBC: 25.21 10*3/uL — ABNORMAL HIGH (ref 3.10–9.50)
WBC: 26.09 10*3/uL — ABNORMAL HIGH (ref 3.10–9.50)

## 2022-03-12 LAB — MAGNESIUM: Magnesium: 2.2 mg/dL (ref 1.6–2.6)

## 2022-03-12 LAB — MAN DIFF ONLY
Band Neutrophils Absolute: 0.5 10*3/uL (ref 0.00–1.00)
Band Neutrophils Absolute: 1.57 10*3/uL — ABNORMAL HIGH (ref 0.00–1.00)
Band Neutrophils: 2 %
Band Neutrophils: 6 %
Basophils Absolute Manual: 0 10*3/uL (ref 0.00–0.08)
Basophils Absolute Manual: 0 10*3/uL (ref 0.00–0.08)
Basophils Manual: 0 %
Basophils Manual: 0 %
Eosinophils Absolute Manual: 0.25 10*3/uL (ref 0.00–0.44)
Eosinophils Absolute Manual: 1.04 10*3/uL — ABNORMAL HIGH (ref 0.00–0.44)
Eosinophils Manual: 1 %
Eosinophils Manual: 4 %
Lymphocytes Absolute Manual: 21.43 10*3/uL — ABNORMAL HIGH (ref 0.42–3.22)
Lymphocytes Absolute Manual: 16.7 10*3/uL — ABNORMAL HIGH (ref 0.42–3.22)
Lymphocytes Manual: 85 %
Lymphocytes Manual: 64 %
Monocytes Absolute: 0.76 10*3/uL (ref 0.21–0.85)
Monocytes Absolute: 2.09 10*3/uL — ABNORMAL HIGH (ref 0.21–0.85)
Monocytes Manual: 3 %
Monocytes Manual: 8 %
Neutrophils Absolute Manual: 2.02 10*3/uL (ref 1.10–6.33)
Neutrophils Absolute Manual: 4.44 10*3/uL (ref 1.10–6.33)
Other Cells: 1 %
Other Cells: 1 %
Segmented Neutrophils: 8 %
Segmented Neutrophils: 17 %

## 2022-03-12 LAB — PHOSPHORUS: Phosphorus: 3.6 mg/dL (ref 2.3–4.7)

## 2022-03-12 LAB — GLUCOSE WHOLE BLOOD - POCT
Whole Blood Glucose POCT: 252 mg/dL — ABNORMAL HIGH (ref 70–100)
Whole Blood Glucose POCT: 225 mg/dL — ABNORMAL HIGH (ref 70–100)
Whole Blood Glucose POCT: 271 mg/dL — ABNORMAL HIGH (ref 70–100)
Whole Blood Glucose POCT: 247 mg/dL — ABNORMAL HIGH (ref 70–100)
Whole Blood Glucose POCT: 229 mg/dL — ABNORMAL HIGH (ref 70–100)

## 2022-03-12 LAB — BASIC METABOLIC PANEL
Anion Gap: 14 (ref 5.0–15.0)
BUN: 49 mg/dL — ABNORMAL HIGH (ref 9.0–28.0)
CO2: 21 mEq/L (ref 17–29)
Calcium: 8.3 mg/dL (ref 7.9–10.2)
Chloride: 101 mEq/L (ref 99–111)
Creatinine: 2.2 mg/dL — ABNORMAL HIGH (ref 0.5–1.5)
Glucose: 301 mg/dL — ABNORMAL HIGH (ref 70–100)
Potassium: 4.2 mEq/L (ref 3.5–5.3)
Sodium: 136 mEq/L (ref 135–145)
eGFR: 30.2 mL/min/{1.73_m2} — AB (ref >=60)

## 2022-03-12 LAB — LACTIC ACID, PLASMA: Lactic Acid: 1.5 mmol/L (ref 0.2–2.0)

## 2022-03-12 LAB — HIGH SENSITIVITY TROPONIN-I: hs Troponin-I: 14.5 ng/L

## 2022-03-12 LAB — C-REACTIVE PROTEIN: C-Reactive Protein: 25.8 mg/dL — ABNORMAL HIGH (ref 0.0–1.1)

## 2022-03-12 LAB — PROBNP: NT-proBNP: 3115 pg/mL — ABNORMAL HIGH (ref 0–450)

## 2022-03-12 LAB — URIC ACID: Uric acid: 9.9 mg/dL — ABNORMAL HIGH (ref 3.6–9.7)

## 2022-03-12 LAB — PSA: Prostate Specific Antigen, Total: 1.58 ng/mL (ref 0.000–4.000)

## 2022-03-12 MED ORDER — ATORVASTATIN CALCIUM 20 MG PO TABS
40.0000 mg | ORAL_TABLET | Freq: Every evening | ORAL | Status: DC
Start: 2022-03-12 — End: 2022-03-20
  Administered 2022-03-12 – 2022-03-19 (×8): 40 mg via ORAL
  Filled 2022-03-12 (×7): qty 2

## 2022-03-12 MED ORDER — INSULIN LISPRO 100 UNIT/ML SOLN (WRAP)
1.0000 [IU] | Freq: Three times a day (TID) | Status: DC
Start: 2022-03-12 — End: 2022-03-14
  Administered 2022-03-12 – 2022-03-13 (×3): 3 [IU] via SUBCUTANEOUS
  Administered 2022-03-13: 1 [IU] via SUBCUTANEOUS
  Administered 2022-03-14: 3 [IU] via SUBCUTANEOUS
  Filled 2022-03-12: qty 6
  Filled 2022-03-12: qty 3
  Filled 2022-03-12 (×3): qty 9

## 2022-03-12 MED ORDER — SODIUM CHLORIDE 0.9 % IV BOLUS
500.0000 mL | Freq: Once | INTRAVENOUS | Status: AC
Start: 2022-03-12 — End: 2022-03-12
  Administered 2022-03-12: 500 mL via INTRAVENOUS

## 2022-03-12 MED ORDER — FUROSEMIDE 10 MG/ML IJ SOLN
40.0000 mg | Freq: Two times a day (BID) | INTRAMUSCULAR | Status: DC
Start: 2022-03-12 — End: 2022-03-12

## 2022-03-12 MED ORDER — INSULIN LISPRO 100 UNIT/ML SOLN (WRAP)
1.0000 [IU] | Freq: Every evening | Status: DC
Start: 2022-03-12 — End: 2022-03-14
  Administered 2022-03-12 – 2022-03-13 (×2): 2 [IU] via SUBCUTANEOUS
  Filled 2022-03-12 (×2): qty 6

## 2022-03-12 NOTE — Nursing Progress Note (Addendum)
0030. Pt received to unit post RRT. Bedside report obtained. Pt hypotensive on levo gtt and albumin being administered. Notified APP of patient's arrival.         [facility] SKIN ASSESSMENT NOTE    [name] [name]  [birth year]  [id number]    Braden Scale Score: 21    Plan of Care initiated for Risk for Altered Skin: Yes    Skin Interventions Applied:     Z Flo Pillow to offload head if patient unable to reposition: Yes    Ear protection applied if applicable: No      Mepilex Lite or Gecko nasal pad applied for Bipap if applicable: No      Trach dressing applied if applicable:  No      Mepilex Dressing applied to sacrum: Yes  Barrier cream applied to sacrum if no sacral dressing: Not Applicable    Mepilex Dressing applied to heel if applicable: No    Prevalon boots applied to heel to offload No      Tap system in use to if applicable:  Yes    If patient out of bed, is chair cushion in use: Yes      If Wound / Pressure Injury Present:    Wound / PI Documented on Patient Avatar No      Wound / PI assessment documented in Flowsheet: No      Admitting Physician notified: No      Wound Consult ordered: No      Gilman Buttner, RN  Mar 12, 2022  12:43 AM    Second RN Name: Lucienne Minks, RN

## 2022-03-12 NOTE — Progress Notes (Signed)
Infectious Disease            Progress Note    03/12/2022   [name] [name] ZOX:[id number] is a 76 y.o. male, with history significant for diabetes mellitus, hypertension, chronic kidney disease, anxiety, depression, sleep apnea, COPD, hyperlipidemia, congestive heart failure, nonischemic cardiomyopathy, status post AICD placement, CAD with leukocytosis, lymphadenopathy.    Subjective:     [name] today Symptoms: Afebrile, transferred to [facility], still leukocytosis, H&H stable, no vomiting or diarrhea. Other review of system is non contributory.    Objective:     Blood pressure 119/59, pulse 79, temperature 97.3 F (36.3 C), temperature source Temporal, resp. rate 14, height 1.829 m (6'), weight 95.7 kg (210 lb 15.7 oz), SpO2 93 %.    General Appearance: No acute distress  HEENT: Pallor negative, Anicteric sclera.    Neck:    Supple  Lungs: Decreased breath sound at bases.    Chest Wall: Symmetric chest wall expansion.   Heart : S1 and S2.   Abdomen: Abdomen is soft, bowel sounds positive.  Neurological: Alert and oriented to person, place and time.  Moves all extremities  Extremities: Lower extremity edema  Skin/scrotum: Scrotal swelling and tenderness  Psychiatric: Mood and affect is normal    Laboratory And Diagnostic Studies:     Recent Labs     03/12/22  0349 03/11/22  2325   WBC 26.09* 25.21*   Hgb 11.0* 11.3*   Hematocrit 32.8* 33.8*   Platelets 155 160   Segmented Neutrophils 17 8     Recent Labs     03/12/22  0349 03/11/22  2325   Sodium 135 134*   Potassium 4.0 4.6   Chloride 100 99   CO2 21 23   BUN 51.0* 50.0*   Creatinine 2.3* 2.2*   Glucose 267* 257*   Calcium 8.3 8.2     Recent Labs     03/12/22  0349 03/11/22  0611   AST (SGOT) 8 8   ALT 9 8   Alkaline Phosphatase 65 65   Protein, Total 5.4* 5.4*   Albumin 2.8* 2.6*   Bilirubin, Total 1.5* 1.2       Current Med's:     Current Facility-Administered Medications   Medication Dose Route Frequency    aspirin EC  81 mg Oral Daily     atorvastatin  40 mg Oral QHS    enoxaparin  40 mg Subcutaneous Q24H SCH    fluticasone furoate-vilanterol  1 puff Inhalation QAM    insulin glargine  20 Units Subcutaneous QAM    insulin lispro  1-3 Units Subcutaneous QHS    insulin lispro  1-5 Units Subcutaneous TID AC    metOLazone  2.5 mg Oral Weekly    niacin  500 mg Oral BID    PARoxetine  20 mg Oral QAM    spironolactone  12.5 mg Oral Daily       Lines/Drains:     Patient Lines/Drains/Airways Status       Active Lines, Drains and Airways       Name Placement date Placement time Site Days    Peripheral IV 03/09/22 20 G Standard Anterior;Distal;Right Upper Arm 03/09/22  1455  Upper Arm  2    Peripheral IV 03/12/22 22 G Left;Posterior Forearm 03/12/22  0100  Forearm  less than 1    External Urinary Catheter 03/11/22  2155  --  less than 1  Assessment:     Condition: Guarded  Perinephric stranding; pyelonephritis?  Unlikely  Retroperitoneal, inguinal, mesenteric lymphadenopathy  CLL  Congestive heart failure  Cardiomyopathy  S/p ICD placement  Diabetes mellitus  Left hydrocele  Chronic kidney disease  Hypertension  Coronary artery disease  Chronic obstructive pulmonary disease  Hyperlipidemia  Sleep apnea  Depression    Plan:     Discontinue Zosyn  Will follow cultures  Hematology follow-up  Cardiology follow-up  Duction of electrolytes  Continue supportive care  Discussed with Dr. Murvin Natal  Physical therapy          Alfonzo Beers, MD, M.D.,FACP  03/12/2022  8:50 AM          *This note was generated by the Epic EMR system/ Dragon speech recognition and may contain inherent errors or omissions not intended by the user. Grammatical errors, random word insertions, deletions, pronoun errors and incomplete sentences are occasional consequences of this technology due to software limitations. Not all errors are caught or corrected. If there are questions or concerns about the content of this note or information contained within the body of this  dictation they should be addressed directly with the author for clarification

## 2022-03-12 NOTE — Significant Event (Signed)
Primary RN was notified by Clinical Tech that patient has a chest pain at 2254.  Patient stated having a chest pain 6/10. The VS were obtained, ECG is done, charge nurse at bedside. The provider is called. The RRT called at 2301.       2303: BP 75/46 MAP 56  2312: RRT initiated and blood glucose 225  2314: Security arrived at beside  2315: Pt rated crushing CP 7-8 out of 10 \  2316: 2 L O2 applied   2316: CCRN Cheryl and RT arrived at bedside   2318: CP rated 8/10  2319: Dr. Senaida Ores and Nurse Supervisor Lillia Abed arrived at bedside. BP 53/42    2320: BP 85/47 Map (57)  2322: 500 Nacl Bolus ordered by Dr. Senaida Ores  2326: CP rated 5/10  2327: 20 G in LA and labs collected   2328: RL sharp groin pain rated 4-5/10  2330: Pt reported dizziness and BP 69/53 MAP 60  2334: Pain in right groin moving down leg and increased swelling noted at site   2335: Lactic Acid ordered and [facility] accepted transfer, blood cultures ordered   2337: Albumin ordered and BP 60/13 Map (44)  2338: BP 76/57 Map (64)  2340: RL groin pain rated 9/10  2345: BP 64/50 Map (57)  2354: Albumin started at 572mL/hr   2355: Norepinephrine Barbituate started at 28mcg/min  0000: BP: 78/53 Map (62)  0005: BP 82/57 Map (66)  0006: Pt transferred to [facility] and RRT ended.

## 2022-03-12 NOTE — RRT Follow Up Note (Signed)
[facility] HOSPITALIST RRT/MSET Note  Patient Info:   Date/Time: 03/12/2022 / 12:47 AM   Admit Date:03/09/2022  Patient Name:[name] [name]   ZOX:[id number]   PCP: Zorita Pang, MD  Attending Physician:Rahman, Md Ashfiqur, MD    Subjective:   Reason for RRT/Code: Hypotension and chest pain  Chief Complaint:  Shortness of Breath, Groin Swelling, and Leg Swelling    03/12/22   ROS    Chest pain, right groin pain, low BP    Assessment:   Substernal chest pain  Hypotension  Scrotal edema  Right thigh pain with erythema  CHF EF of 25 to 72%    76 year old male with cardiomyopathy with depressed EF and history of CLL who was given carvedilol and Entresto this evening with low blood pressure and subsequent substernal chest pain/pressure at 8/10 in intensity.    Per arrival to the floor blood pressure was in the 60s over 40s.  Patient was in reverse Trendelenburg and started on 500 cc bolus.  Ordered rainbow labs to be drawn for stat labs.  Also ordered stat chest x-ray.  Contacted [facility] for further evaluation.  Unable to administer any sublingual nitro or morphine due to low blood pressure.    Labs were drawn and sent for evaluation.  Labs including CBC, BMP, lactate, troponin, proBNP.  [facility] APP agreed with transfer given hypotension and chest pain.  Started on Levophed and albumin prior to transfer from floor.    Plan:    Transfer to [facility] for Levophed and pain control as needed    DVT Prophylaxis: Lovenox  Central Line/Foley Catheter/PICC line status: Foley  Code Status: Full Code  Disposition:Transfer to [facility]  Type of Admission:Inpatient  Hospital Problems:     Active Hospital Problems    Diagnosis    Acute on chronic congestive heart failure, unspecified heart failure type     Objective:     Vitals:    03/12/22 0019 03/12/22 0021 03/12/22 0030 03/12/22 0032   BP: 91/58 (!) 72/34 101/58 101/58   Pulse: 79 77 84 84   Resp: 14  (!) 35 20   Temp:       TempSrc:       SpO2: 99%  98% 99%   Weight:       Height:         Physical  Exam:   Physical Exam    Blood pressure in 60s to 70s systolic.  Patient alert and oriented complaining of chest pain which improved to 5/10 in intensity during RRT    Chest clear to auscultation anteriorly  Cardiovascular notable for normal S1-S2 no S3 or murmurs were appreciated  Right lower extremity and groin notable for significant scrotal edema and erythema and tenderness of anterior lateral thigh on the right    Communication:   Informed Family member as patient is unable to respond or agrees to sharing information: Other N/A  I held a detailed conversation with patient's Family Member :   I summarized the patient's current medical status, and provided updates on significant events including .   We reviewed the overall treatment plan and options including:   Did Family express their concerns     Were Life support / Other medical interventions discussed:       Attending Provider was informed of the Significant event: {[id number]    Discussed with Providers involved in the care of this acute event     Results of Labs/imaging:   Labs have been reviewed:   Coagulation  Profile:       CBC review: @LABRCNTIP (wbc:5,hgb:5,hct:5,plt:5,mcv:5,rdw:5,NEUTRO:5, neutrabs:5,  lymphsabs:5,bandsabd:5,LYMPHOCYTESA:5,EOSINOPHILSA:5,  BASOPHILSAUT:5,IMMATUREGRAN:5,NEUTROABS:5,ABSOLUTEIMMA:5)@  Chem Review:@LABRCNTIP (na:5,k:5,cl:5,co2:5,bun:5,creat:5,glu:5,ca:5,mg:5,phos:5,  BILITOTAL:5,AST:5,ALT:5,ALKPHOS:5)@  Results       Procedure Component Value Units Date/Time    NT-proBNP [[id number]]  (Abnormal) Collected: 03/11/22 2325     Updated: 03/12/22 0009     NT-proBNP 3,115 pg/mL     Manual Differential [[id number]]  (Abnormal) Collected: 03/11/22 2325     Updated: 03/12/22 0006     Segmented Neutrophils 8 %      Band Neutrophils 2 %      Lymphocytes Manual 85 %      Monocytes Manual 3 %      Eosinophils Manual 1 %      Basophils Manual 0 %      Other Cells 1 %      Neutrophils Absolute Manual 2.02 x10 3/uL      Band Neutrophils Absolute  0.50 x10 3/uL      Lymphocytes Absolute Manual 21.43 x10 3/uL      Monocytes Absolute 0.76 x10 3/uL      Eosinophils Absolute Manual 0.25 x10 3/uL      Basophils Absolute Manual 0.00 x10 3/uL     Cell MorpHology [[id number]]  (Abnormal) Collected: 03/11/22 2325     Updated: 03/12/22 0006     Cell Morphology Abnormal     Platelet Estimate Normal     Macrocytic 2+     Platelet Clumps Present    CBC and differential [[id number]]  (Abnormal) Collected: 03/11/22 2325    Specimen: Blood Updated: 03/12/22 0005     WBC 25.21 x10 3/uL      Hgb 11.3 g/dL      Hematocrit 62.9 %      Platelets 160 x10 3/uL      RBC 3.28 x10 6/uL      MCV 103.0 fL      MCH 34.5 pg      MCHC 33.4 g/dL      RDW 14 %      MPV 12.0 fL      Instrument Absolute Neutrophil Count 3.07 x10 3/uL      Nucleated RBC 0.0 /100 WBC      Absolute NRBC 0.00 x10 3/uL     High Sensitivity Troponin-I [[id number]] Collected: 03/11/22 2325    Specimen: Blood Updated: 03/12/22 0003     hs Troponin-I 14.5 ng/L     Magnesium [[id number]] Collected: 03/11/22 2325    Specimen: Blood Updated: 03/11/22 2358     Magnesium 2.2 mg/dL     Basic Metabolic Panel [[id number]]  (Abnormal) Collected: 03/11/22 2325    Specimen: Blood Updated: 03/11/22 2358     Glucose 257 mg/dL      BUN 59.5 mg/dL      Creatinine 2.2 mg/dL      Calcium 8.2 mg/dL      Sodium 638 mEq/L      Potassium 4.6 mEq/L      Chloride 99 mEq/L      CO2 23 mEq/L      Anion Gap 12.0     eGFR 30.2 mL/min/1.73 m2     Glucose Whole Blood - POCT [[id number]]  (Abnormal) Collected: 03/11/22 2312     Updated: 03/11/22 2318     Whole Blood Glucose POCT 224 mg/dL     Glucose Whole Blood - POCT [[id number]]  (Abnormal) Collected: 03/11/22 2102     Updated: 03/11/22 2105     Whole Blood Glucose POCT 289 mg/dL  Glucose Whole Blood - POCT [[id number]]  (Abnormal) Collected: 03/11/22 1604     Updated: 03/11/22 1606     Whole Blood Glucose POCT 193 mg/dL     Glucose Whole Blood - POCT [[id number]]  (Abnormal) Collected: 03/11/22 1215      Updated: 03/11/22 1219     Whole Blood Glucose POCT 191 mg/dL     Glucose Whole Blood - POCT [[id number]]  (Abnormal) Collected: 03/11/22 0740     Updated: 03/11/22 0745     Whole Blood Glucose POCT 153 mg/dL     Manual Differential [[id number]]  (Abnormal) Collected: 03/11/22 0611     Updated: 03/11/22 0727     Segmented Neutrophils 13 %      Band Neutrophils 0 %      Lymphocytes Manual 78 %      Monocytes Manual 7 %      Eosinophils Manual 1 %      Basophils Manual 0 %      Other Cells 1 %      Neutrophils Absolute Manual 3.59 x10 3/uL      Band Neutrophils Absolute 0.00 x10 3/uL      Lymphocytes Absolute Manual 21.52 x10 3/uL      Monocytes Absolute 1.93 x10 3/uL      Eosinophils Absolute Manual 0.28 x10 3/uL      Basophils Absolute Manual 0.00 x10 3/uL     Cell MorpHology [[id number]]  (Abnormal) Collected: 03/11/22 0611     Updated: 03/11/22 0727     Cell Morphology Abnormal     Platelet Estimate Normal     Macrocytic 2+     Smudge Cells Present    CBC and differential [[id number]]  (Abnormal) Collected: 03/11/22 0611    Specimen: Blood Updated: 03/11/22 0727     WBC 27.59 x10 3/uL      Hgb 11.4 g/dL      Hematocrit 32.2 %      Platelets 151 x10 3/uL      RBC 3.34 x10 6/uL      MCV 102.4 fL      MCH 34.1 pg      MCHC 33.3 g/dL      RDW 14 %      MPV 11.8 fL      Instrument Absolute Neutrophil Count 3.17 x10 3/uL      Nucleated RBC 0.0 /100 WBC      Absolute NRBC 0.00 x10 3/uL     Comprehensive metabolic panel [[id number]]  (Abnormal) Collected: 03/11/22 0611    Specimen: Blood Updated: 03/11/22 0646     Glucose 164 mg/dL      BUN 37.6 mg/dL      Creatinine 1.8 mg/dL      Sodium 283 mEq/L      Potassium 4.1 mEq/L      Chloride 104 mEq/L      CO2 20 mEq/L      Calcium 8.2 mg/dL      Protein, Total 5.4 g/dL      Albumin 2.6 g/dL      AST (SGOT) 8 U/L      ALT 8 U/L      Alkaline Phosphatase 65 U/L      Bilirubin, Total 1.2 mg/dL      Globulin 2.8 g/dL      Albumin/Globulin Ratio 0.9     Anion Gap 12.0     eGFR 38.5  mL/min/1.73 m2     Magnesium [[id number]] Collected: 03/11/22 0611    Specimen:  Blood Updated: 03/11/22 0646     Magnesium 1.9 mg/dL     Phosphorus [[id number]] Collected: 03/11/22 0611    Specimen: Blood Updated: 03/11/22 0646     Phosphorus 3.7 mg/dL     C Reactive Protein [[id number]]  (Abnormal) Collected: 03/11/22 0611    Specimen: Blood Updated: 03/11/22 0646     C-Reactive Protein 23.6 mg/dL     Sedimentation rate (ESR) [[id number]]  (Abnormal) Collected: 03/11/22 0611    Specimen: Blood Updated: 03/11/22 0637     Sed Rate >120 mm/Hr           Radiology reports have been reviewed:  Radiology Results (24 Hour)       Procedure Component Value Units Date/Time    CT Abdomen Pelvis WO IV/ WO PO Cont [[id number]] Collected: 03/11/22 6295    Order Status: Completed Updated: 03/11/22 0843    Narrative:      HISTORY:  Leukocytosis. Scrotal swelling.    COMPARISON: 02/17/2021.    TECHNIQUE: CT of the abdomen and pelvis performed without intravenous contrast. The following dose reduction techniques were utilized: automated exposure control and/or adjustment of the mA and/or KV according to patient size, and the use of an iterative   reconstruction technique.    FINDINGS:  Please note that evaluation of the viscera and vasculature is limited in the absence of IV contrast.     There is minimal atelectasis at the lung bases. Mild bronchiectasis present in the bilateral lower lobes.    No calcified gallstones. The liver, spleen, adrenal glands pancreas are unremarkable on this noncontrast study.    There is bilateral perinephric stranding. There is stranding along the right pelvic sidewall.. There is no hydronephrosis. No renal, ureteral bladder calculi are seen. Bilateral renal cysts are present.    There is marked retroperitoneal, pelvic and inguinal adenopathy. Moderate mesenteric adenopathy is present. Right pelvic sidewall lymph node measures 7.7 x 5.9 cm (series 7 image 133). Right inguinal lymph node measures 2.9 x 3.8 cm  (series 4 image 167).   Mesenteric lymph node measures 4.0 x 1.7 cm (series 4 image 77).    Bladder is mildly distended. The prostate is mildly enlarged. There is moderate anasarca. There is moderate scrotal wall thickening. Degenerative changes are present in the lumbar spine. Small bilateral fat-containing inguinal hernias.      Impression:        1. Retroperitoneal, inguinal and mesenteric adenopathy. Differential possibilities include lymphoma or metastatic disease. Percutaneous biopsy is recommended.  2. Moderate scrotal wall thickening.  3. Other findings as above.    Sharon D'Heureux, MD  03/11/2022 8:41 AM          EKG: EKG reviewed   Last EKG Result       Procedure Component Value Units Date/Time    ECG 12 lead [[id number]] Collected: 03/09/22 1241     Updated: 03/10/22 1616     Ventricular Rate 84 BPM      Atrial Rate 84 BPM      P-R Interval 144 ms      QRS Duration 88 ms      Q-T Interval 400 ms      QTC Calculation (Bezet) 472 ms      P Axis 53 degrees      R Axis -14 degrees      T Axis 92 degrees      IHS MUSE NARRATIVE AND IMPRESSION --     SINUS RHYTHM withoccasional PVC  WHEN COMPARED WITH ECG OF  17-Feb-2021 03:22,  PREMATURE VENTRICULAR COMPLEXES ARE NOW PRESENT  VENTRICULAR RATE HAS INCREASED BY   8 BPM  Confirmed by Deretha Emory MD, Pennie Rushing [phone]) on 03/10/2022 4:16:09 PM      Narrative:      SINUS RHYTHM withoccasional PVC  WHEN COMPARED WITH ECG OF 17-Feb-2021 03:22,  PREMATURE VENTRICULAR COMPLEXES ARE NOW PRESENT  VENTRICULAR RATE HAS INCREASED BY   8 BPM  Confirmed by Deretha Emory MD, Pennie Rushing [phone]) on 03/10/2022 4:16:09 PM    ECG 12 lead (Electrocardiogram) [[id number]] Collected: 03/11/22 0520     Updated: 03/11/22 1419     Ventricular Rate 87 BPM      Atrial Rate 87 BPM      P-R Interval 144 ms      QRS Duration 90 ms      Q-T Interval 388 ms      QTC Calculation (Bezet) 466 ms      P Axis -7 degrees      R Axis -66 degrees      T Axis 93 degrees      IHS MUSE NARRATIVE AND IMPRESSION --     Atrial-sensed  ventricular-paced rhythm OCCASIONAL PREMATURE VENTRICULAR COMPLEXES  ABNORMAL ECG    Confirmed by Denton Lank MD, ANNE ([id number]) on 03/11/2022 2:19:42 PM      Narrative:      Atrial-sensed ventricular-paced rhythm OCCASIONAL PREMATURE VENTRICULAR COMPLEXES  ABNORMAL ECG    Confirmed by Denton Lank MD, ANNE ([id number]) on 03/11/2022 2:19:42 PM    ECG 12 lead (Electrocardiogram) [[id number]] Collected: 03/10/22 0804     Updated: 03/10/22 0804     Ventricular Rate 77 BPM      Atrial Rate 77 BPM      P-R Interval 144 ms      QRS Duration 88 ms      Q-T Interval 422 ms      QTC Calculation (Bezet) 477 ms      P Axis 58 degrees      R Axis -20 degrees      T Axis 84 degrees      IHS MUSE NARRATIVE AND IMPRESSION --     Atrial-sensed ventricular-paced rhythm OCCASIONAL PREMATURE VENTRICULAR COMPLEXES  ABNORMAL ECG      Narrative:      Atrial-sensed ventricular-paced rhythm OCCASIONAL PREMATURE VENTRICULAR COMPLEXES  ABNORMAL ECG    EKG SCAN [[id number]] Resulted: 03/09/22 2141     Updated: 03/09/22 2141          Hospitalist   Signed by:   Rayburn Ma, MD  03/12/2022 12:47 AM    Critical illness or injury that acutely impairs one or more vital organ systems: Cardiovascular    Time spent for Critical care, evaluation, and management of this critically ill or critically injured patient.This includes my interpretation of labs, diagnostic studies, and procedures.: > 30 minutes    *This note was generated by the Epic EMR system/ Dragon speech recognition and may contain inherent errors or omissions not intended by the user. Grammatical errors, random word insertions, deletions, pronoun errors and incomplete sentences are occasional consequences of this technology due to software limitations. Not all errors are caught or corrected. If there are questions or concerns about the content of this note or information contained within the body of this dictation they should be addressed directly with the author for clarification

## 2022-03-12 NOTE — Progress Notes (Signed)
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 He was aggressively diuresed on the floor for volume overload. He was upgraded to the [facility] for new hypotension requiring Levophed, suspected from over-diuresis.       Subjective/ Last 24 hour Events:     Patient states his SOB has improved but his scrotal edema remains the same. He denies any groin pain of tenderness to palpation.     Assessment:     Patient Active Problem List   Diagnosis    Type 2 diabetes mellitus with complication, with long-term current use of insulin    Heart attack    CAD (coronary artery disease)    Non-ischemic cardiomyopathy    Acute exacerbation of CHF (congestive heart failure)    PUD (peptic ulcer disease)    Acute dyspnea    CHF (congestive heart failure)    Headaches due to old head injury    Essential hypertension    Syncope and collapse    AICD (automatic cardioverter/defibrillator) present    Cardiomyopathy    SOB (shortness of breath)    GERD (gastroesophageal reflux disease)    Hypertensive heart disease without heart failure    Heart failure, unspecified    Nonischemic cardiomyopathy    Pure hypercholesterolemia    Chronic systolic congestive heart failure    Encounter for implantable defibrillator reprogramming or check    Episode of recurrent major depressive disorder, unspecified depression episode severity    Stage 3 chronic kidney disease, 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    Anxiety and depression    COPD (chronic obstructive pulmonary disease)    HLD (hyperlipidemia)    Sleep apnea, obstructive    Scrotal edema     Hydrocele, unspecified hydrocele type    Acute on chronic congestive heart failure, unspecified heart failure type       Plan:     Admitting diagnosis: #Hypotension from over diuresis  - crystalloid bolus  - Wean Levophed for MAP goals >52mmHg  - Appreciate Cardiology recommendations  - holding Coreg, Entresto and Lasix    Neuro: Hx of MDD and GAD  - Con't home Paxil    Cardio:  #HTN, HLD, CAD, ICM with HFrEF, ICD  - Appreciate Cardiology recommendations  - holding Coreg and Lasix  - Con't ASA, Lipitor    Resp: NAI  - Supplemental O2 as needed for SpO2 >94%  - Breo-Ellipta qAM    GI: NAI  - Reg diet    Infectious Disease (ID): NAI  - ID consulted, recommends discontinuing antibiotics as there is no concerns for infectious source at this time    Hem/Onc: #Hx of CLL  - Oncology following, lymphadenopathy from CLL  - WBC at baseline per patient's outpatient records  - VTE ppx: Lovenox inj & SCDs    Renal:  #Left Hydrocele and scrotal edema  - 5/24 CT A/P with moderate scrotal wall thickening and intraabdominal adenopathy suspected from CLL  - Scrotum edematous but not tender on palpation and no  crepitus noted  - 5/19 Scrotal US with left hydrocele    Endo:  DM2  - f/u A1c  - Lantus + ISS      Patient has BMI=Body mass index is 28.61 kg/m.  Diagnosis: No additional diagnosis based on BMI criteria       Recent Labs     03/12/22  0349 03/11/22  2325 03/11/22  0611 03/10/22  0604 03/09/22  1457   Sodium 135 134* 136 138 139     Diagnosis: Mild Hyponatremia     GENERAL CRITICAL CARE ASSESSMENT/ PLANS- IF APPLICABLE    Vascular Access: PIV   Central line? If yes day#  GI Prophylaxis: N/A  VTE Prophylaxis: Lovenox Inj  Nutrition: Reg Diet  Sedation: none  Foley Catheter: none  AM Labs and Chest xray ordered: yes  Code Status:  full code  [facility]      Hospital Course:   5/22: Admitted to Floor for volume overload. Diuresis with lasix 80mg  BID And GDMT  5/23: GDMT decreased  5/24: ID consulted for leukocytosis.  Mild hypotension. GDMT held  [facility]    Medications:   Scheduled Meds:  Current Facility-Administered Medications   Medication Dose Route Frequency    aspirin EC  81 mg Oral Daily    atorvastatin  40 mg Oral QHS    enoxaparin  40 mg Subcutaneous Q24H SCH    fluticasone furoate-vilanterol  1 puff Inhalation QAM    insulin glargine  20 Units Subcutaneous QAM    insulin lispro  1-3 Units Subcutaneous QHS    insulin lispro  1-5 Units Subcutaneous TID AC    niacin  500 mg Oral BID    PARoxetine  20 mg Oral QAM       Continuous Infusions:   norepinephrine 7 mcg/min (03/12/22 1234)        Physical Exam:     Vitals:    03/12/22 1234   BP: 99/60   Pulse: 90   Resp:    Temp:    SpO2:          Intake/Output Summary (Last 24 hours) at 03/12/2022 1251  Last data filed at 03/12/2022 1233  Gross per 24 hour   Intake 1243.53 ml   Output 1100 ml   Net 143.53 ml       Physical Exam  Vitals reviewed.   Constitutional:       General: He is not in acute distress.     Appearance: He is not toxic-appearing.   HENT:      Head: Normocephalic.      Mouth/Throat:      Mouth: Mucous membranes are moist.      Pharynx: Oropharynx is clear.   Eyes:      Extraocular Movements: Extraocular movements intact.      Pupils: Pupils are equal, round, and reactive to light.   Cardiovascular:      Rate and Rhythm: Normal rate and regular rhythm.      Pulses: Normal pulses.      Heart sounds: Normal heart sounds.   Pulmonary:      Effort: Pulmonary effort is normal.      Breath sounds: Normal breath sounds. No wheezing, rhonchi or rales.   Abdominal:      General: Abdomen is flat. Bowel sounds are normal.      Palpations: Abdomen is soft.   Genitourinary:     Comments: Edematous scrotum. Minimal TTP. No crepitus   No  skin changes noted  Musculoskeletal:         General: Normal range of motion.      Cervical back: Normal range of motion.      Right lower leg: No edema.      Left lower leg: No edema.   Skin:     General: Skin is  warm and dry.      Capillary Refill: Capillary refill takes 2 to 3 seconds.   Neurological:      General: No focal deficit present.      Mental Status: He is alert and oriented to person, place, and time.      Motor: No weakness.           Data:     Invasive Critical Care Hemodynamics:                   Vent Settings:         Lines/Drains/Airways:    External Urinary Catheter (Active)   Collection Container Suction canister (external cath only) 03/12/22 0200   Securement Method Securement device 03/12/22 0200   Site Assessment Swelling present (comment) 03/12/22 0200   Device Assessment Connected to [employer];Intact 03/12/22 0200   Interventions Changed 03/12/22 0200   Urine Output (mL) 200 mL 03/12/22 1000   Number of days: 1       [REMOVED] External Urinary Catheter (Removed)   Collection Container Suction canister (external cath only) 03/10/22 1626   Securement Method Securement device 03/10/22 1626   Site Assessment WDL 03/10/22 1626   Device Assessment Intact;No Air Leak;Connected to Suction 03/10/22 1626   Interventions Changed 03/10/22 1626   Urine Output (mL) 1200 mL 03/10/22 1626   Number of days: 2       [REMOVED] External Urinary Catheter (Removed)   Urine Output (mL) 650 mL 03/11/22 0615   Number of days: 0                      Labs:     Labs (last 72 hours):  Recent Labs     03/12/22  0349 03/11/22  2325 03/11/22  0611   WBC 26.09* 25.21* 27.59*   Hgb 11.0* 11.3* 11.4*   Hematocrit 32.8* 33.8* 34.2*     No results for input(s): PT, INR, PTT in the last 72 hours. Recent Labs     03/12/22  0349 03/11/22  2325 03/11/22  0611   Sodium 135 134* 136   Potassium 4.0 4.6 4.1   Chloride 100 99 104   CO2 21 23 20    BUN 51.0* 50.0* 41.0*   Creatinine 2.3* 2.2* 1.8*   Glucose 267* 257* 164*   Calcium 8.3 8.2 8.2   Magnesium 2.2 2.2 1.9   Phosphorus 3.6  --  3.7               Rads:   Radiological Imaging personally reviewed, including:   XR Chest AP Portable    Result Date: 03/12/2022  No acute abnormality.  03/14/2022 Merchant 03/12/2022 4:11 AM       Critical care provider statement:     Critical care time (minutes):  43    I have personally reviewed the patient's history and 24 hour interval events, along with vitals, labs, radiology images and nursing.     Critical care time was exclusive of separately billable procedures and treating other patients and teaching time.   Critical care was time spent personally by me on one or  more of the following activities: discussions with consultants, development of treatment plan with patient or surrogate, evaluation of patient's response to treatment, examination of patient, obtaining history from patient or surrogate, ordering and performing treatments and interventions, ordering and review of laboratory studies, ordering and review of radiographic studies, pulse oximetry, re-evaluation of patient's condition and review of old charts.   At least one organ system is acutely impaired.   There is a high probability of imminent, life-threatening deterioration.   I intervened to try to prevent further deterioration of the patient's condition.        Signed by:  Talmadge Chad, PA-C                       [facility]              03/12/22 12:51 PM             Medical Intensive Care Unit           Ext. [phone]          Intermediate Care Unit                       MD Ext: [phone]          NP Ext. [phone]           8p-8a provider Coverage Ex [phone]    Attending Attestation:   I have directly reviewed the clinical findings, labs, imaging studies and management of this patient in detail. I have interviewed and examined the patient and agree with the documentation, assessment and plan of [name] [name] as recorded by Talmadge Chad, PA     Judson Roch, MD

## 2022-03-12 NOTE — Plan of Care (Signed)
Problem: Day of Admission - Heart Failure  Goal: Heart Failure Admission  Outcome: Progressing     Problem: Everyday - Heart Failure  Goal: Stable Vital Signs and Fluid Balance  Outcome: Progressing  Goal: Mobility/Activity is Maintained at Optimal Level for Patient  Outcome: Progressing  Goal: Nutritional Intake is Adequate  Outcome: Progressing  Goal: Teaching-Using CHF Warning Zones and Educational Videos  Outcome: Progressing     Problem: Day of Discharge - Heart Failure  Goal: Discharge Education  Outcome: Progressing     Problem: Safety  Goal: Patient will be free from injury during hospitalization  Outcome: Progressing  Goal: Patient will be free from infection during hospitalization  Outcome: Progressing

## 2022-03-12 NOTE — Progress Notes (Signed)
Pt is transported by [employer], Press photographer and primary RN to [facility] room 312.  The report given to Tacey Ruiz, RN.

## 2022-03-12 NOTE — Consults (Signed)
[facility]   ADVANCED HEART FAILURE CONSULTATION REPORT  [facility]    Date Time: 03/12/22 9:37 AM  Patient Name: [name] [name]  Requesting Physician: Judson Roch, MD       Reason for Consultation:   CHF    History:   [name] [name] is a 76 y.o. male admitted on 03/09/2022.  We have been asked by Judson Roch, MD,  to provide cardiac consultation, regarding CHF.      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  For HF GDMT, he is on carvedilol 25 mg twice daily, Entresto 97/23 mg twice daily, Jardiance 10 mg daily, and spironolactone 12.5 mg daily in the outpatient setting.  He has a Medtronic ICD in place.    He was initially seen by urology couple weeks prior for severe scrotal edema.  He also mentioned that he had lower extremity edema as well.  Eventually was admitted for worsening swelling and shortness of breath.  He has been treated with IV Lasix 80 mg twice daily dosing.    Chest x-ray showed no acute cardiopulmonary process.  He had an ultrasound of the scrotum and testicles, showed moderate size, left high steel scrotal wall edema.  Past couple days, he has noticed improvement in his breathing and lower extremity edema.  However, he still has some lingering scrotal edema that is not quite back to baseline.    Last 24 hour events:   Upgrade to [facility] for hypotension with systolics in the 60s overnight requiring levophed gtt.       Past Medical History:     Past Medical History:   Diagnosis Date    Acute CHF     NOV  & DEC 2018, - DIALYSIS CATH PLACED Aug 24 2017 TO REMOVE FLUID     Acute systolic (congestive) heart failure 08/2017    Anxiety     CAD (coronary artery disease)     Cardiomyopathy     nonischemic    CHF (congestive heart failure) 08/12/2014, 2013    Chronic obstructive pulmonary disease     POSSIBLE PER PT HE USES  SYNBICORT BID ABD  BREO INHALER PRN    Coronary artery disease 2011    CORONARY  STENT PLACEMENT FOLLOWED BY [facility]    Depression     echocardiogram 02/2016, 11/2016, 09/2017, 12/20/2017    GERD (gastroesophageal reflux disease)     Heart attack 2011    and 2014    Hyperlipemia     Hypertension     ICD (implantable 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     PLACED 2014  [facility]  LAST INTERROGATION April 01 2018 REPORT REQUESTED    Ischemic cardiomyopathy     EF 15% ON ECHO 09-20-2017 DR GARG IN EPIC    Nonischemic cardiomyopathy     NSTEMI (non-ST elevated myocardial infarction)     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    Pacemaker 2014    MEDTRONIC ICD/PACEMAKER COMBO LAST INTERROGATION  12-12 2018    Pneumonia 09/2016    Primary cardiomyopathy     Ischemic cardiomyopathy EF 15% ON ECHO 09-20-2017 DR GARG IN EPIC    Sepsis 08/2017    Syncope and collapse     PRIOR TO ICD/PACEMAKER PLACED 2014    Type 2 diabetes mellitus, controlled DX 1998    BS  AVG 100  A1C 7.4 Dec 27 2017    [place]     PT SEE  HIS PMD April 01 2018 RE THIS-TOLD TO SEE PMD BY [facility] JUNE 12 WHEN THEY SAW HIM FOR A CL       Past Surgical History:     Past Surgical History:   Procedure Laterality Date    BIV  11/10/2016    ICD METRONIC  UPGRADE     CARDIAC CATHETERIZATION  03/2010    CORONARY STENT PLACEMENT X 2 PER PT; LM normal, LAD 70-80% distal lesion at apex, 20% lesion mid CFX    CARDIAC CATHETERIZATION  12/2012    CARDIAC DEFIBRILLATOR PLACEMENT  12/2012    [facility]    CARDIAC PACEMAKER PLACEMENT  2014    MEDTRONIC ICD/PACEMAKER PLACED [facility]    CIRCUMCISION  AGE 34    COLONOSCOPY  2009    CORRECTION HAMMER TOE      duodenal ulcer  1973    STRESS RELATED IN SCHOOL    ECHOCARDIOGRAM, TRANSTHORACIC  02/2016 11/2016 09/2017 12/2017    ECHOCARDIOGRAM, TRANSTHORACIC  02/2016,11/2016,12/20/2017    EGD N/A 08/15/2014    Procedure: EGD;  Surgeon: Colon Branch, MD;  Location: [facility];  Service: Gastroenterology;  Laterality: N/A;  egd w/ bx    EGD  1975    EGD, COLONOSCOPY N/A 04/04/2018    Procedure: EGD with bxs, COLONOSCOPY with  polypectomy and clipping;  Surgeon: Doyne Keel, MD;  Location: [facility];  Service: Gastroenterology;  Laterality: N/A;    FRACTURE SURGERY  AGE 67     RT  ANKLE- CAST APPLIED    HERNIA REPAIR  AGE 34    LEFT INGUINAL HERNIA    MPI nuclear study  06/2016    orthopedic surgery  AGE 53    RT foot corrective - HAMMERTOE    TONSILLECTOMY AND ADENOIDECTOMY  1954       Family History:     Family History   Problem Relation Age of Onset    Breast cancer Mother     Heart attack Mother 60    Hypertension Mother     Diabetes Mother     Coronary artery disease Mother     Diabetes Brother     Heart attack Father     Hypertension Father        Social History:     Social History     Socioeconomic History    Marital status: Married     Spouse name: Teacher, music    Number of children: 0    Years of education: Not on file    Highest education level: Not on file   Occupational History    Occupation: Runner, 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[place], [place]    Tobacco Use    Smoking status: Never    Smokeless tobacco: Never   Vaping Use    Vaping status: Never Used   Substance and Sexual Activity    Alcohol use: No     Alcohol/week: 0.0 standard drinks of alcohol     Comment: occasionally    Drug use: No    Sexual activity: Yes     Partners: Female   Other Topics Concern    Not on file   Social History Narrative    Not on file     Social Determinants of Health     Financial Resource Strain: Not on file   Food Insecurity: Not on file   Transportation Needs: Not on file   Physical Activity: Not on file   Stress: Not on file   Social  Connections: Not on file   Intimate Partner Violence: Not on file   Housing Stability: Not on file       Allergies:   No Known Allergies    Medications:     Medications Prior to Admission   Medication Sig    aspirin EC 81 MG EC tablet Take 1 tablet (81 mg) by mouth daily    atorvastatin (LIPITOR) 40 MG tablet Take 1 tablet (40 mg total) by mouth nightly    carvedilol (COREG) 25 MG tablet Take 1 tablet (25 mg total) by mouth 2  (two) times daily with meals    empagliflozin (JARDIANCE) 10 MG tablet Take 1 tablet (10 mg total) by mouth daily    fluticasone-salmeterol (ADVAIR DISKUS) 100-50 MCG/ACT Aerosol Pwdr, Breath Activated Inhale 1 puff into the lungs 2 (two) times daily    glucose blood (ONE TOUCH ULTRA TEST) test strip 1 each by Other route 2 (two) times daily Use as instructed    hydrocodone-chlorpheniramine (TUSSIONEX) 10-8 MG/5ML extended-release suspension Take 5 mLs by mouth 2 (two) times daily    insulin glargine (LANTUS SOLOSTAR) 100 UNIT/ML injection pen Patient takes 50 units at night    Insulin Pen Needle (BD Pen Needle Nano U/F) 32G X 4 MM Misc Use as directed bid    metOLazone (ZAROXOLYN) 2.5 MG tablet Take 1 tablet (2.5 mg total) by mouth once a week    niacin (NIASPAN) 500 MG CR tablet TAKE 1 TABLET BY MOUTH TWICE DAILY    PARoxetine (Paxil) 20 MG tablet Take 1 tablet (20 mg total) by mouth every morning    sacubitril-valsartan (Entresto) 97-103 MG Tab per tablet Take 1 tablet by mouth 2 (two) times daily    Semglee, yfgn, 100 UNIT/ML Solution Pen-injector ADMINISTER 50 UNITS UNDER THE SKIN AT BEDTIME    SITagliptin-metFORMIN (Janumet) 50-500 MG tablet Take 1 tablet by mouth 2 (two) times daily with meals    spironolactone (ALDACTONE) 25 MG tablet Take 0.5 tablets (12.5 mg total) by mouth daily    torsemide (Demadex) 20 MG tablet Take 2 tablets (40 mg) by mouth every morning AND 1 tablet (20 mg) every evening.         Current Facility-Administered Medications   Medication Dose Route Frequency Provider Last Rate Last Admin    acetaminophen (TYLENOL) tablet 650 mg  650 mg Oral Q6H PRN Rayburn Ma, MD   650 mg at 03/11/22 1336    aspirin EC tablet 81 mg  81 mg Oral Daily Rayburn Ma, MD   81 mg at 03/12/22 0857    atorvastatin (LIPITOR) tablet 40 mg  40 mg Oral QHS Rayburn Ma, MD   40 mg at 03/11/22 2153    dextrose (GLUCOSE) 40 % oral gel 15 g of glucose  15 g of glucose Oral PRN Rayburn Ma, MD        Or    dextrose (D10W) 10% bolus 125 mL  12.5 g Intravenous PRN Rayburn Ma, MD        Or    dextrose 50 % bolus 12.5 g  12.5 g Intravenous PRN Rayburn Ma, MD        Or    glucagon (rDNA) (GLUCAGEN) injection 1 mg  1 mg Intramuscular PRN Rayburn Ma, MD        enoxaparin (LOVENOX) syringe 40 mg  40 mg Subcutaneous Q24H [facility] Rayburn Ma, MD   40 mg at 03/12/22 [phone]  fluticasone furoate-vilanterol (BREO ELLIPTA) 100-25 MCG/ACT 1 puff  1 puff Inhalation QAM Rayburn Ma, MD        insulin glargine (LANTUS) injection 20 Units  20 Units Subcutaneous QAM Rayburn Ma, MD   20 Units at 03/12/22 0858    insulin lispro injection 1-3 Units  1-3 Units Subcutaneous QHS Rayburn Ma, MD   2 Units at 03/11/22 2153    insulin lispro injection 1-5 Units  1-5 Units Subcutaneous TID Concepcion Elk, MD   3 Units at 03/12/22 0858    niacin (NIASPAN) CR tablet 500 mg  500 mg Oral BID Rayburn Ma, MD   500 mg at 03/11/22 1816    norepinephrine (LEVOPHED) 8 mg in dextrose 5% 250 mL infusion  0-10 mcg/min Intravenous Continuous Rudy Jew, PA 18.8 mL/hr at 03/12/22 0600 10 mcg/min at 03/12/22 0600    PARoxetine (PAXIL) tablet 20 mg  20 mg Oral QAM Rayburn Ma, MD   20 mg at 03/12/22 0857    sodium chloride 0.9 % bolus 500 mL  500 mL Intravenous Once Judson Roch, MD             Review of Systems:    Comprehensive review of systems including constitutional, eyes, ears, nose, mouth, throat, cardiovascular, GI, GU, musculoskeletal, integumentary, respiratory, neurologic, psychiatric, and endocrine is negative other than what is mentioned already in the history of present illness    Physical Exam:     Vitals:    03/12/22 0800   BP: 119/59   Pulse: 79   Resp: 14   Temp:    SpO2: 93%     Temp (24hrs), Avg:97.8 F (36.6 C), Min:97.3 F (36.3 C), Max:98.7 F (37.1 C)      Intake and Output Summary (Last 24 hours) at Date  Time    Intake/Output Summary (Last 24 hours) at 03/12/2022 2244  Last data filed at 03/12/2022 0601  Gross per 24 hour   Intake 1043.53 ml   Output 900 ml   Net 143.53 ml       GENERAL: Patient is in no acute distress   HEENT: Moist mucous membranes   NECK: No jugular venous distention  CARDIAC: Regular rate and rhythm, with normal S1 and S2, and no murmurs,  CHEST: Clear to auscultation bilaterally, normal respiratory effort  ABDOMEN: Soft, nontender, non-distended, good bowel sounds   EXTREMITIES: No edema, +scrotal edema, 2+ radial pulses palpable   SKIN: No rash or jaundice   NEUROLOGIC: Alert and oriented to time, place and person, normal mood and affect    MUSCULOSKELETAL: Normal muscle strength and tone.      Labs Reviewed:     Recent Labs   Lab 03/11/22  2325 03/09/22  1740   hs Troponin-I 14.5 19.9   hs Troponin-I Delta  --  1.3             Recent Labs   Lab 03/12/22  0349 03/11/22  0611 03/10/22  0604   Bilirubin, Total 1.5*  More results in Results Review 1.1   Bilirubin Direct  --   --  0.4   Protein, Total 5.4*  More results in Results Review 5.3*   Albumin 2.8*  More results in Results Review 2.8*   ALT 9  More results in Results Review 9   AST (SGOT) 8  More results in Results Review 9   More results in Results Review = values in this interval not displayed.  Recent Labs   Lab 03/12/22  0349   Magnesium 2.2         Recent Labs   Lab 03/12/22  0349 03/11/22  2325 03/11/22  0611   WBC 26.09* 25.21* 27.59*   Hgb 11.0* 11.3* 11.4*   Hematocrit 32.8* 33.8* 34.2*   Platelets 155 160 151     Recent Labs   Lab 03/12/22  0349 03/11/22  2325 03/11/22  0611   Sodium 135 134* 136   Potassium 4.0 4.6 4.1   Chloride 100 99 104   CO2 21 23 20    BUN 51.0* 50.0* 41.0*   Creatinine 2.3* 2.2* 1.8*   eGFR 28.6* 30.2* 38.5*   Glucose 267* 257* 164*   Calcium 8.3 8.2 8.2         Radiology   Radiological Procedure reviewed.      chest X-ray  Assessment:   Recent admission for acute on chronic systolic CHF and scrotal  edema  EF 25 to 30% by echo August 2022.  Moderate tricuspid and mitral regurgitation present.  PA pressures were 54 mmHg.   Nonischemic cardiomyopathy with EF as low as 15% May 2021, now up to 25 to 30% by echo August 2022  Negative cardiac catheterization for coronary disease 2014  Medtronic ICD in place  Diabetes  Hypertension      Recommendations:   Hold diuretics   Ok to gently give 500cc IV fluids. Suspect he is overdiurese  Continue to wean levophed gtt for SBP >100 or MAP >60.   Will hold off on Coreg and Entresto for now in light of hypotension   Discussed above plan with Dr. Murvin NatalBassi this morning     Signed by: Conception OmsJenny Kim, NP      I have personally interviewed and examined the patient.  I have reviewed the provider's history, exam, assessment and management plans. I concur with or have edited all elements of the provider's note.      GENERAL: Patient is in no acute distress   HEENT: Moist mucous membranes   NECK: No jugular venous distention  CARDIAC: Regular rate and rhythm, with normal S1 and S2, and no murmurs,  CHEST: Clear to auscultation bilaterally, normal respiratory effort  ABDOMEN: Soft, nontender, non-distended, good bowel sounds   EXTREMITIES: No edema, +scrotal edema, 2+ radial pulses palpable   SKIN: No rash or jaundice   NEUROLOGIC: Alert and oriented to time, place and person, normal mood and affect    MUSCULOSKELETAL: Normal muscle strength and tone.    Jarrett Sohoim Srinivas Lippman, MD [facility]  Advanced HF and Transplant Consult Attending     Greater than 30 minutes were spent in preparation and face to face with the patient counseling regarding, but not limited to, the results and implications of diagnostic testing, potential additional testing options, explaining the current diagnoses, educating the patient on proper management of nonischemic cardiomyopathy including adherence, and discussing prognosis, as well as with coordination of care.

## 2022-03-12 NOTE — Plan of Care (Signed)
Problem: Everyday - Heart Failure  Goal: Stable Vital Signs and Fluid Balance  Outcome: Progressing  Flowsheets (Taken 03/12/2022 1425)  Stable Vital Signs and Fluid Balance:   Daily Standing Weights in the morning using the same scale, after using the bathroom and before breadfast.  If unable to stand, zero the bed and use the bed scale   Monitor, assess vital signs and telemetry per policy   Fluid Restriction   Assess for swelling/edema   Monitor labs and report abnormalities to physician  Goal: Mobility/Activity is Maintained at Optimal Level for Patient  Outcome: Progressing  Flowsheets (Taken 03/11/2022 1034 by Theodoro Doing, RN)  Mobility/Activity is Maintained at Optimal Level for Patient:   Increase mobility as tolerated/progressive mobility protocol   Reposition patient every 2 hours and as needed unless able to reposition self   Maintain SCD's as Ordered   Perform active/passive ROM   Assess for changes in respiratory status, level of consciousness and/or development of fatigue   Consult with Physical Therapy and/or Occupational Therapy

## 2022-03-12 NOTE — OT Plan of Care Note (Signed)
Occupational Therapy Cancellation Note    Patient: [name] [name]  ZOX:[id number]    Unit: J811/[id number]    Patient not seen for occupational therapy secondary to patient not yet medically appropriate per RN due to hypotension and on levophed. Will continue to follow up as appropriate.    Felecia Jan, OTR/L  [facility]  Physical Medicine and Rehab Department

## 2022-03-12 NOTE — Consults (Signed)
CRITICAL CARE  [facility]      [name] is a 76yo gentleman with PMH of anxiety, depression, CLL, hypertension, upper lipidemia, STEMI x2, cardiomyopathy with a preserved EF of 25%, ICD in place, congestive heart failure, coronary artery disease, COPD, GERD, diabetes, admitted to the hospital floor due to shortness of breath, and scrotal edema.    When he arrived at [facility] his blood he became hypotensive with a systolic in the 60s.  Hospitalist ordered to place him in Trendelenburg and administered 500 cc bolus of normal saline.  Given did not improvement, hospitalist kindly asked for critical care consult, and I recommended to start vasoactive medications.    This is a patient at bedside upon arrival to the unit.  He reports feeling better since the beginning of norepinephrine infusion.  He endorses some dizziness and lightheadedness but otherwise feels better.  Physical examination is impressive for scrotal edema and some lower abdominal induration most likely consistent with an enlarged bladder.  CT scan of the abdomen and pelvis shows retroperitoneal, inguinal, and mesenteric adenopathy.  Scrotal ultrasound shows a moderate right size complex left hydrocele with septation, no intratesticular masses.  His blood pressure improved with a continuous infusion of norepinephrine now at 10 mcg/min.  Patient is on IV antibiotics, he is also on diuretics furosemide 80 mg IV twice a day, we will most likely reduce the amount of diuretic in light of the low blood pressure.  If no improvement or increment in the vasoactive medication requirements, patient will be transferred to the intensive care unit.      Physical Exam:  Vitals:    03/12/22 0715   BP:    Pulse: 89   Resp: (!) 38   Temp:    SpO2: 97%         General Appearance: Alert, not in no distress  Mental status: Alert, oriented to person, place, and time  Neuro: Alert, oriented, normal speech, no focal findings or movement disorder  noted  Neck: Supple, no significant adenopathy  Lungs: Clear to auscultation, no wheezes, rales or rhonchi, symmetric air entry  Cardiac: Normal rate, regular rhythm, normal S1, S2, no murmurs, rubs, clicks or gallops  Abdomen: Partial induration of the lower abdomen / suprapubic area. Nontender, nondistended, no masses  Extremities: Large scrotal edema with hydrocele, peripheral pulses normal, no pedal edema, no clubbing or cyanosis. Negative Homan sign  Skin: Normal coloration and turgor, no rashes, no suspicious skin lesions noted      Plan:  - Continue Norepinephrine titrate per MAP >65  - Continue IV Furosemide 40mg  BID  - Echocardiogram  - Consult nephrology  - Consult cardiology  - Continue IV abx   - Troponin negative  - Monitor CBC (with his CLL)   - Blood cultures pending  - Monitor BP    Crit care time 40 min.

## 2022-03-13 LAB — CBC
Absolute NRBC: 0 10*3/uL (ref 0.00–0.00)
Hematocrit: 35.8 % — ABNORMAL LOW (ref 37.6–49.6)
Hgb: 11.8 g/dL — ABNORMAL LOW (ref 12.5–17.1)
MCH: 34.3 pg — ABNORMAL HIGH (ref 25.1–33.5)
MCHC: 33 g/dL (ref 31.5–35.8)
MCV: 104.1 fL — ABNORMAL HIGH (ref 78.0–96.0)
MPV: 12 fL (ref 8.9–12.5)
Nucleated RBC: 0 /100 WBC (ref 0.0–0.0)
Platelets: 175 10*3/uL (ref 142–346)
RBC: 3.44 10*6/uL — ABNORMAL LOW (ref 4.20–5.90)
RDW: 14 % (ref 11–15)
WBC: 38.71 10*3/uL — ABNORMAL HIGH (ref 3.10–9.50)

## 2022-03-13 LAB — GLUCOSE WHOLE BLOOD - POCT
Whole Blood Glucose POCT: 234 mg/dL — ABNORMAL HIGH (ref 70–100)
Whole Blood Glucose POCT: 213 mg/dL — ABNORMAL HIGH (ref 70–100)
Whole Blood Glucose POCT: 181 mg/dL — ABNORMAL HIGH (ref 70–100)
Whole Blood Glucose POCT: 207 mg/dL — ABNORMAL HIGH (ref 70–100)

## 2022-03-13 LAB — BASIC METABOLIC PANEL
Anion Gap: 13 (ref 5.0–15.0)
BUN: 51 mg/dL — ABNORMAL HIGH (ref 9.0–28.0)
CO2: 21 mEq/L (ref 17–29)
Calcium: 8.3 mg/dL (ref 7.9–10.2)
Chloride: 101 mEq/L (ref 99–111)
Creatinine: 2.1 mg/dL — ABNORMAL HIGH (ref 0.5–1.5)
Glucose: 260 mg/dL — ABNORMAL HIGH (ref 70–100)
Potassium: 4.1 mEq/L (ref 3.5–5.3)
Sodium: 135 mEq/L (ref 135–145)
eGFR: 31.9 mL/min/{1.73_m2} — AB (ref >=60)

## 2022-03-13 LAB — MAGNESIUM: Magnesium: 2.2 mg/dL (ref 1.6–2.6)

## 2022-03-13 LAB — PHOSPHORUS: Phosphorus: 3.4 mg/dL (ref 2.3–4.7)

## 2022-03-13 LAB — HEMOGLOBIN A1C
Average Estimated Glucose: 108.3 mg/dL
Hemoglobin A1C: 5.4 % (ref 4.6–5.6)

## 2022-03-13 MED ORDER — SENNOSIDES-DOCUSATE SODIUM 8.6-50 MG PO TABS
1.0000 | ORAL_TABLET | Freq: Every evening | ORAL | Status: DC
Start: 2022-03-13 — End: 2022-03-20
  Administered 2022-03-13 – 2022-03-19 (×7): 1 via ORAL
  Filled 2022-03-13 (×7): qty 1

## 2022-03-13 MED ORDER — SODIUM CHLORIDE 0.9 % IV BOLUS
250.0000 mL | Freq: Once | INTRAVENOUS | Status: AC | PRN
Start: 2022-03-13 — End: 2022-03-13

## 2022-03-13 MED ORDER — POLYETHYLENE GLYCOL 3350 17 G PO PACK
17.0000 g | PACK | Freq: Every day | ORAL | Status: DC
Start: 2022-03-13 — End: 2022-03-20
  Administered 2022-03-13 – 2022-03-19 (×5): 17 g via ORAL
  Filled 2022-03-13 (×8): qty 1

## 2022-03-13 NOTE — Plan of Care (Signed)
Called re: scrotal edema.  Previously seen Urology for scrotal edema.  Concern over residual edema despite diuresis for CHF.  Also with CLL and concern for possible edema related to lymphadenopathy.  My team does not perform inguinal lymphadenectomies.  We also feel this is unlikely to help the edema and may even worsen it.  Recommend conservative measures for scrotal edema and consideration for Urology consult.  Will hold off on consultation at this time.  Please call [phone] if there are questions.

## 2022-03-13 NOTE — Plan of Care (Signed)
Patient A&Ox4, MAE, had 6/10 HA and PRN Tylenol helped. Oxygen saturation >94% in RA, and blood pressure is stable on Levophed 2-10 mcg gtt to keep MAP > 65, if SBP is in the low 80's patient will experience dizziness while laying down, as a result, Levophed increased to .  Patient makes AUO and still have scrotal and penile edema that is very uncomfortable for patient to use External catheter or void while laying down, which was discontinued and patient assisted to use urinal standing at the bedside with minimal assist and tolerated well.      Problem: Day of Admission - Heart Failure  Goal: Heart Failure Admission  Outcome: Progressing     Problem: Everyday - Heart Failure  Goal: Stable Vital Signs and Fluid Balance  Outcome: Progressing  Goal: Mobility/Activity is Maintained at Optimal Level for Patient  Outcome: Progressing  Goal: Nutritional Intake is Adequate  Outcome: Progressing  Goal: Teaching-Using CHF Warning Zones and Educational Videos  Outcome: Progressing     Problem: Day of Discharge - Heart Failure  Goal: Discharge Education  Outcome: Progressing     Problem: Safety  Goal: Patient will be free from injury during hospitalization  Outcome: Progressing  Goal: Patient will be free from infection during hospitalization  Outcome: Progressing     Problem: Pain  Goal: Pain at adequate level as identified by patient  Outcome: Progressing     Problem: Side Effects from Pain Analgesia  Goal: Patient will experience minimal side effects of analgesic therapy  Outcome: Progressing     Problem: Discharge Barriers  Goal: Patient will be discharged home or other facility with appropriate resources  Outcome: Progressing     Problem: Psychosocial and Spiritual Needs  Goal: Demonstrates ability to cope with hospitalization/illness  Outcome: Progressing     Problem: Moderate/High Fall Risk Score >5  Goal: Patient will remain free of falls  Outcome: Progressing     Problem: Fluid and Electrolyte Imbalance/  Endocrine  Goal: Fluid and electrolyte balance are achieved/maintained  Outcome: Progressing  Goal: Adequate hydration  Outcome: Progressing     Problem: Diabetes: Glucose Imbalance  Goal: Blood glucose stable at established goal  Outcome: Progressing

## 2022-03-13 NOTE — Plan of Care (Signed)
Pt's MAP between 60-71 today off the Levophed drip. Asymptomatic. Up to bathroom and chair. Scrotal edema too painful to stay up in the chair for very long.

## 2022-03-13 NOTE — Consults (Signed)
[facility]   ADVANCED HEART FAILURE CONSULTATION REPORT  [facility]    Date Time: 03/13/22 9:24 AM  Patient Name: [name] [name]  Requesting Physician: Judson Roch, MD       Reason for Consultation:   CHF    History:   [name] [name] is a 76 y.o. male admitted on 03/09/2022.  We have been asked by Judson Roch, MD,  to provide cardiac consultation, regarding CHF.      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  For HF GDMT, he is on carvedilol 25 mg twice daily, Entresto 97/23 mg twice daily, Jardiance 10 mg daily, and spironolactone 12.5 mg daily in the outpatient setting.  He has a Medtronic ICD in place.    He was initially seen by urology couple weeks prior for severe scrotal edema.  He also mentioned that he had lower extremity edema as well.  Eventually was admitted for worsening swelling and shortness of breath.  He has been treated with IV Lasix 80 mg twice daily dosing.    Chest x-ray showed no acute cardiopulmonary process.  He had an ultrasound of the scrotum and testicles, showed moderate size, left high steel scrotal wall edema.  Past couple days, he has noticed improvement in his breathing and lower extremity edema.  However, he still has some lingering scrotal edema that is not quite back to baseline.    Last 24 hour events:   Levo gtt down titrated to 64mcg/min       Past Medical History:     Past Medical History:   Diagnosis Date    Acute CHF     NOV  & DEC 2018, - DIALYSIS CATH PLACED Aug 24 2017 TO REMOVE FLUID     Acute systolic (congestive) heart failure 08/2017    Anxiety     CAD (coronary artery disease)     Cardiomyopathy     nonischemic    CHF (congestive heart failure) 08/12/2014, 2013    Chronic obstructive pulmonary disease     POSSIBLE PER PT HE USES  SYNBICORT BID ABD  BREO INHALER PRN    Coronary artery disease 2011    CORONARY STENT PLACEMENT FOLLOWED BY [facility]    Depression      echocardiogram 02/2016, 11/2016, 09/2017, 12/20/2017    GERD (gastroesophageal reflux disease)     Heart attack 2011    and 2014    Hyperlipemia     Hypertension     ICD (implantable 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     PLACED 2014  [facility]  LAST INTERROGATION April 01 2018 REPORT REQUESTED    Ischemic cardiomyopathy     EF 15% ON ECHO 09-20-2017 DR GARG IN [facility]    Nonischemic cardiomyopathy     NSTEMI (non-ST elevated myocardial infarction)     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    Pacemaker 2014    MEDTRONIC ICD/PACEMAKER COMBO LAST INTERROGATION  12-12 2018    Pneumonia 09/2016    Primary cardiomyopathy     Ischemic cardiomyopathy EF 15% ON ECHO 09-20-2017 DR GARG IN EP[facility]    Sepsis 08/2017    Syncope and collapse     PRIOR TO ICD/PACEMAKER PLACED 2014    Type 2 diabetes mellitus, controlled DX 1998    BS  AVG 100  A1C 7.4 Dec 27 2017    [facility]     PT SEE HIS PMD April 01 2018 RE THIS-TOLD TO  SEE PMD BY [facility] JUNE 12 WHEN THEY SAW HIM FOR A CL       Past Surgical History:     Past Surgical History:   Procedure Laterality Date    BIV  11/10/2016    ICD METRONIC  UPGRADE     CARDIAC CATHETERIZATION  03/2010    CORONARY STENT PLACEMENT X 2 PER PT; LM normal, LAD 70-80% distal lesion at apex, 20% lesion mid CFX    CARDIAC CATHETERIZATION  12/2012    CARDIAC DEFIBRILLATOR PLACEMENT  12/2012    [facility]    CARDIAC PACEMAKER PLACEMENT  2014    MEDTRONIC ICD/PACEMAKER PLACED  HEART    CIRCUMCISION  AGE 89    COLONOSCOPY  2009    CORRECTION HAMMER TOE      duodenal ulcer  1973    STRESS RELATED IN SCHOOL    ECHOCARDIOGRAM, TRANSTHORACIC  02/2016 11/2016 09/2017 12/2017    ECHOCARDIOGRAM, TRANSTHORACIC  02/2016,11/2016,12/20/2017    EGD N/A 08/15/2014    Procedure: EGD;  Surgeon: Colon Branch, MD;  Location: [facility];  Service: Gastroenterology;  Laterality: N/A;  egd w/ bx    EGD  1975    EGD, COLONOSCOPY N/A 04/04/2018    Procedure: EGD with bxs, COLONOSCOPY with polypectomy and clipping;  Surgeon: Doyne Keel,  MD;  Location: [facility];  Service: Gastroenterology;  Laterality: N/A;    FRACTURE SURGERY  AGE 20     RT  ANKLE- CAST APPLIED    HERNIA REPAIR  AGE 89    LEFT INGUINAL HERNIA    MPI nuclear study  06/2016    orthopedic surgery  AGE 76    RT foot corrective - HAMMERTOE    TONSILLECTOMY AND ADENOIDECTOMY  1954       Family History:     Family History   Problem Relation Age of Onset    Breast cancer Mother     Heart attack Mother 87    Hypertension Mother     Diabetes Mother     Coronary artery disease Mother     Diabetes Brother     Heart attack Father     Hypertension Father        Social History:     Social History     Socioeconomic History    Marital status: Married     Spouse name: Teacher, music    Number of children: 0    Years of education: Not on file    Highest education level: Not on file   Occupational History    Occupation: Runner, 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, [place]    Tobacco Use    Smoking status: Never    Smokeless tobacco: Never   Vaping Use    Vaping status: Never Used   Substance and Sexual Activity    Alcohol use: No     Alcohol/week: 0.0 standard drinks of alcohol     Comment: occasionally    Drug use: No    Sexual activity: Yes     Partners: Female   Other Topics Concern    Not on file   Social History Narrative    Not on file     Social Determinants of Health     Financial Resource Strain: Not on file   Food Insecurity: Not on file   Transportation Needs: Not on file   Physical Activity: Not on file   Stress: Not on file   Social Connections: Not on file   Intimate Partner  Violence: Not on file   Housing Stability: Not on file       Allergies:   No Known Allergies    Medications:     Medications Prior to Admission   Medication Sig    aspirin EC 81 MG EC tablet Take 1 tablet (81 mg) by mouth daily    atorvastatin (LIPITOR) 40 MG tablet Take 1 tablet (40 mg total) by mouth nightly    carvedilol (COREG) 25 MG tablet Take 1 tablet (25 mg total) by mouth 2 (two) times daily with meals    empagliflozin  (JARDIANCE) 10 MG tablet Take 1 tablet (10 mg total) by mouth daily    fluticasone-salmeterol (ADVAIR DISKUS) 100-50 MCG/ACT Aerosol Pwdr, Breath Activated Inhale 1 puff into the lungs 2 (two) times daily    glucose blood (ONE TOUCH ULTRA TEST) test strip 1 each by Other route 2 (two) times daily Use as instructed    hydrocodone-chlorpheniramine (TUSSIONEX) 10-8 MG/5ML extended-release suspension Take 5 mLs by mouth 2 (two) times daily    insulin glargine (LANTUS SOLOSTAR) 100 UNIT/ML injection pen Patient takes 50 units at night    Insulin Pen Needle (BD Pen Needle Nano U/F) 32G X 4 MM Misc Use as directed bid    metOLazone (ZAROXOLYN) 2.5 MG tablet Take 1 tablet (2.5 mg total) by mouth once a week    niacin (NIASPAN) 500 MG CR tablet TAKE 1 TABLET BY MOUTH TWICE DAILY    PARoxetine (Paxil) 20 MG tablet Take 1 tablet (20 mg total) by mouth every morning    sacubitril-valsartan (Entresto) 97-103 MG Tab per tablet Take 1 tablet by mouth 2 (two) times daily    Semglee, yfgn, 100 UNIT/ML Solution Pen-injector ADMINISTER 50 UNITS UNDER THE SKIN AT BEDTIME    SITagliptin-metFORMIN (Janumet) 50-500 MG tablet Take 1 tablet by mouth 2 (two) times daily with meals    spironolactone (ALDACTONE) 25 MG tablet Take 0.5 tablets (12.5 mg total) by mouth daily    torsemide (Demadex) 20 MG tablet Take 2 tablets (40 mg) by mouth every morning AND 1 tablet (20 mg) every evening.         Current Facility-Administered Medications   Medication Dose Route Frequency Provider Last Rate Last Admin    acetaminophen (TYLENOL) tablet 650 mg  650 mg Oral Q6H PRN Rayburn Ma, MD   650 mg at 03/12/22 2024    aspirin EC tablet 81 mg  81 mg Oral Daily Rayburn Ma, MD   81 mg at 03/13/22 0844    atorvastatin (LIPITOR) tablet 40 mg  40 mg Oral QHS Judson Roch, MD   40 mg at 03/12/22 2027    dextrose (GLUCOSE) 40 % oral gel 15 g of glucose  15 g of glucose Oral PRN Rayburn Ma, MD        Or    dextrose (D10W) 10% bolus 125  mL  12.5 g Intravenous PRN Rayburn Ma, MD        Or    dextrose 50 % bolus 12.5 g  12.5 g Intravenous PRN Rayburn Ma, MD        Or    glucagon (rDNA) (GLUCAGEN) injection 1 mg  1 mg Intramuscular PRN Rayburn Ma, MD        enoxaparin (LOVENOX) syringe 40 mg  40 mg Subcutaneous Q24H [facility] Lynden Oxford B, MD   40 mg at 03/13/22 0844    fluticasone furoate-vilanterol (BREO ELLIPTA) 100-25 MCG/ACT 1 puff  1 puff Inhalation QAM Rayburn Ma, MD   1 puff at 03/13/22 0821    insulin glargine (LANTUS) injection 20 Units  20 Units Subcutaneous QAM Rayburn Ma, MD   20 Units at 03/13/22 0845    insulin lispro injection 1-4 Units  1-4 Units Subcutaneous QHS Talmadge Chad, PA   2 Units at 03/12/22 2108    insulin lispro injection 1-8 Units  1-8 Units Subcutaneous TID AC Talmadge Chad, PA   3 Units at 03/13/22 0845    niacin (NIASPAN) CR tablet 500 mg  500 mg Oral BID Rayburn Ma, MD   500 mg at 03/13/22 4010    norepinephrine (LEVOPHED) 8 mg in dextrose 5% 250 mL infusion  0-10 mcg/min Intravenous Continuous Stevan Born F, PA 4.7 mL/hr at 03/13/22 0903 2.5 mcg/min at 03/13/22 2725    PARoxetine (PAXIL) tablet 20 mg  20 mg Oral QAM Rayburn Ma, MD   20 mg at 03/13/22 3664         Review of Systems:    Comprehensive review of systems including constitutional, eyes, ears, nose, mouth, throat, cardiovascular, GI, GU, musculoskeletal, integumentary, respiratory, neurologic, psychiatric, and endocrine is negative other than what is mentioned already in the history of present illness    Physical Exam:     Vitals:    03/13/22 0903   BP:    Pulse: 95   Resp:    Temp:    SpO2:      Temp (24hrs), Avg:98.3 F (36.8 C), Min:97.6 F (36.4 C), Max:98.8 F (37.1 C)      Intake and Output Summary (Last 24 hours) at Date Time    Intake/Output Summary (Last 24 hours) at 03/13/2022 4034  Last data filed at 03/13/2022 0600  Gross per 24 hour   Intake 2511.2 ml   Output 1650  ml   Net 861.2 ml       GENERAL: Patient is in no acute distress   HEENT: Moist mucous membranes   NECK: No jugular venous distention  CARDIAC: Regular rate and rhythm, with normal S1 and S2, and no murmurs,  CHEST: Clear to auscultation bilaterally, normal respiratory effort  ABDOMEN: Soft, nontender, non-distended, good bowel sounds   EXTREMITIES: No edema, +scrotal edema, 2+ radial pulses palpable   SKIN: No rash or jaundice   NEUROLOGIC: Alert and oriented to time, place and person, normal mood and affect    MUSCULOSKELETAL: Normal muscle strength and tone.      Labs Reviewed:     Recent Labs   Lab 03/11/22  2325 03/09/22  1740   hs Troponin-I 14.5 19.9   hs Troponin-I Delta  --  1.3             Recent Labs   Lab 03/12/22  0349 03/11/22  0611 03/10/22  0604   Bilirubin, Total 1.5*  More results in Results Review 1.1   Bilirubin Direct  --   --  0.4   Protein, Total 5.4*  More results in Results Review 5.3*   Albumin 2.8*  More results in Results Review 2.8*   ALT 9  More results in Results Review 9   AST (SGOT) 8  More results in Results Review 9   More results in Results Review = values in this interval not displayed.     Recent Labs   Lab 03/13/22  0228   Magnesium 2.2         Recent Labs   Lab  03/13/22  0228 03/12/22  0349 03/11/22  2325   WBC 38.71* 26.09* 25.21*   Hgb 11.8* 11.0* 11.3*   Hematocrit 35.8* 32.8* 33.8*   Platelets 175 155 160     Recent Labs   Lab 03/13/22  0228 03/12/22  1448 03/12/22  0349   Sodium 135 136 135   Potassium 4.1 4.2 4.0   Chloride 101 101 100   CO2 21 21 21    BUN 51.0* 49.0* 51.0*   Creatinine 2.1* 2.2* 2.3*   eGFR 31.9* 30.2* 28.6*   Glucose 260* 301* 267*   Calcium 8.3 8.3 8.3         Radiology   Radiological Procedure reviewed.      chest X-ray  Assessment:   Recent admission for acute on chronic systolic CHF and scrotal edema  EF 25 to 30% by echo August 2022.  Moderate tricuspid and mitral regurgitation present.  PA pressures were 54 mmHg.   Nonischemic cardiomyopathy with  EF as low as 15% May 2021, now up to 25 to 30% by echo August 2022  Negative cardiac catheterization for coronary disease 2014  Medtronic ICD in place  Diabetes  Hypertension  CLL       Recommendations:   Continue to hold diuretics and GDMT (Coreg and Entresto)   Continue to wean levophed gtt for MAP >60.   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    I have personally interviewed and examined the patient.  I have reviewed the provider's history, exam, assessment and management plans. I concur with or have edited all elements of the provider's note.      Jarrett Soho, MD [facility]  Advanced HF and Transplant Consult Attending    Greater than 30 minutes were spent in preparation and face to face with the patient counseling regarding, but not limited to, the results and implications of diagnostic testing, potential additional testing options, explaining the current diagnoses, educating the patient on proper management of cardiomyopathy and hypotension including adherence, and discussing prognosis, as well as with coordination of care.

## 2022-03-13 NOTE — PT Eval Note (Signed)
Physical Therapy Evaluation  Patient: [name] [name]    [id number]  Discharge Recommendations:   Based on today's session: Acute Rehab     Patient anticipated to benefit from and to be able to engage in 3 hours of therapy a day for 5 days a week.      If Acute Rehab is not available, then the patient will need home health services, increase supervision , assistance with mobility, assistance with ADL's, and assistance with IADL's  If pt returns home, DME Recommended for Discharge: Front wheel walker;BSC      Assessment:   [name] [name] is a 76 y.o. male admitted 03/09/2022.  Pt's functional mobility is impacted by:  decreased activity tolerance, decreased balance, edema, dizziness, gait impairment, decreased strength, and transfers .  There are a few comorbidities or other factors that affect plan of care and require modification of task including: assistive device needed for mobility and dizziness.  Standardized tests and exams incorporated into evaluation include AMPAC mobility, balance, and Strength.  Pt demonstrates an evolving clinical presentation due to multiple comorbidities.   Pt would continue to benefit from PT to address these deficits and increase functional independence.     Complexity Level Hx and Co  morbidites Examination Clinical Decision Making Clinical Presentation   Low no impact 1-2 elements Limited options Stable   Moderate   1-2 factors 3 or more   Several options Evolving, plan may alter   High 3 or more 4 or more Multiple options Unstable, unpredictable       Impairments: Assessment: Decreased LE strength;Decreased endurance/activity tolerance;Decreased functional mobility;Decreased balance;Gait impairment.     Therapy Diagnosis: generalized weakness, decreased functional mobility , decreased independence with ADL's, increased gait dysfunction, and decreased endurance/ activity engagement due to Scrotol edema, SOB. Without therapy interventions, patient is at risk for falls,  dependence on caregivers for mobility, dependence on caregivers for ADL's, decreased independence, and failure to return to PLOF.    Rehabilitation Potential: Prognosis: Good;With continued PT status post acute discharge      Plan:    Treatment/Interventions: Exercise;Gait training;Neuromuscular re-education;Functional transfer training;LE strengthening/ROM;Endurance training;Patient/family training;Bed mobility PT Frequency: 4-5x/wk    Risks/Benefits/POC Discussed with Pt/Family: With patient          Goals:   Goals  Goal Formulation: With patient  Time for Goal Acheivement: By time of discharge  Goals: Select goal  Pt Will Go Supine To Sit: with supervision;to maximize functional mobility and independence;by time of discharge  Pt Will Perform Sit to Stand: with supervision;to maximize functional mobility and independence;by time of discharge  Pt Will Transfer Bed/Chair: with rolling walker;with supervision;to maximize functional mobility and independence;by time of discharge  Pt Will Ambulate: with rolling walker;51-100 feet;with supervision;to maximize functional mobility and independence;by time of discharge       [place]  INTERMEDIATE CARE UNIT W312/W312-A    Time of treatment: Time Calculation  PT Received On: 03/13/22  Start Time: 1215  Stop Time: 1245  Time Calculation (min): 30 min  Total Treatment Time (min): 22    PT Visit Number: 1    Consult received for [name] MichaelisBruce Edward Villa for PT Evaluation and Treatment.  Patient's medical condition is appropriate for Physical therapy intervention at this time.    Precautions and Contraindications:   Precautions  Weight Bearing Status: no restrictions  Other Precautions: Fall    Medical Diagnosis: Shortness of breath [R06.02]  Scrotal swelling [N50.89]  Acute on chronic congestive heart failure, unspecified heart failure type [  I50.9]    History of Present Illness: [name] [name] is a 76 y.o. male admitted on 03/09/2022 with "the chief complaint of  Shortness of Breath, Groin Swelling, and Leg Swelling.     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 Kendall Flack who sent the patient to emergency department he is currently on diuretics 40 mg of torsemide in the morning 20 mg in the evening and metolazone however he reported that he had started to gaining weight and not improving despite taking his diuretics.  He reports he is not smoking and not drinking alcohol he is compliant with the regimen including Entresto carteolol spironolactone  and multiple other medical therapy.  He does not need any urological management of his hydrocele at this time needs to be medically treated for congestive heart failure therefore he was sent by urologist Dr. Molly Maduro her to emergency department." (As per H&P)         Patient Active Problem List   Diagnosis    Type 2 diabetes mellitus with complication, with long-term current use of insulin    Heart attack    CAD (coronary artery disease)    Non-ischemic cardiomyopathy    Acute exacerbation of CHF (congestive heart failure)    PUD (peptic ulcer disease)    Acute dyspnea    CHF (congestive heart failure)    Headaches due to old head injury    Essential hypertension    Syncope and collapse    AICD (automatic cardioverter/defibrillator) present    Cardiomyopathy    SOB (shortness of breath)     GERD (gastroesophageal reflux disease)    Hypertensive heart disease without heart failure    Heart failure, unspecified    Nonischemic cardiomyopathy    Pure hypercholesterolemia    Chronic systolic congestive heart failure    Encounter for implantable defibrillator reprogramming or check    Episode of recurrent major depressive disorder, unspecified depression episode severity    Stage 3 chronic kidney disease, 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    Anxiety and depression    COPD (chronic obstructive pulmonary disease)    HLD (hyperlipidemia)    Sleep apnea, obstructive    Scrotal edema    Hydrocele, unspecified hydrocele type    Acute on chronic congestive heart failure, unspecified heart failure type        Past Medical/Surgical History:  Past Medical History:   Diagnosis Date    Acute CHF     NOV  & DEC 2018, - DIALYSIS CATH PLACED Aug 24 2017 TO REMOVE FLUID     Acute systolic (congestive) heart failure 08/2017    Anxiety     CAD (coronary artery disease)     Cardiomyopathy     nonischemic    CHF (congestive heart failure) 08/12/2014, 2013    Chronic obstructive pulmonary disease     POSSIBLE PER PT HE USES  SYNBICORT BID ABD  BREO INHALER PRN    Coronary artery disease 2011  CORONARY STENT PLACEMENT FOLLOWED BY [facility]    Depression     echocardiogram 02/2016, 11/2016, 09/2017, 12/20/2017    GERD (gastroesophageal reflux disease)     Heart attack 2011    and 2014    Hyperlipemia     Hypertension     ICD (implantable 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     PLACED 2014  [facility]  LAST INTERROGATION April 01 2018 REPORT REQUESTED    Ischemic cardiomyopathy     EF 15% ON ECHO 09-20-2017 DR GARG IN EPIC    Nonischemic cardiomyopathy     NSTEMI (non-ST elevated myocardial infarction)     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    Pacemaker 2014    MEDTRONIC ICD/PACEMAKER COMBO LAST INTERROGATION  12-12 2018    Pneumonia 09/2016    Primary cardiomyopathy     Ischemic cardiomyopathy EF 15% ON ECHO 09-20-2017 DR GARG IN EPIC    Sepsis  08/2017    Syncope and collapse     PRIOR TO ICD/PACEMAKER PLACED 2014    Type 2 diabetes mellitus, controlled DX 1998    BS  AVG 100  A1C 7.4 Dec 27 2017    [place]     PT SEE HIS PMD April 01 2018 RE THIS-TOLD TO SEE PMD BY [facility] JUNE 12 WHEN THEY SAW HIM FOR A CL      Past Surgical History:   Procedure Laterality Date    BIV  11/10/2016    ICD METRONIC  UPGRADE     CARDIAC CATHETERIZATION  03/2010    CORONARY STENT PLACEMENT X 2 PER PT; LM normal, LAD 70-80% distal lesion at apex, 20% lesion mid CFX    CARDIAC CATHETERIZATION  12/2012    CARDIAC DEFIBRILLATOR PLACEMENT  12/2012    [facility]    CARDIAC PACEMAKER PLACEMENT  2014    MEDTRONIC ICD/PACEMAKER PLACED [facility]    CIRCUMCISION  AGE 47    COLONOSCOPY  2009    CORRECTION HAMMER TOE      duodenal ulcer  1973    STRESS RELATED IN SCHOOL    ECHOCARDIOGRAM, TRANSTHORACIC  02/2016 11/2016 09/2017 12/2017    ECHOCARDIOGRAM, TRANSTHORACIC  02/2016,11/2016,12/20/2017    EGD N/A 08/15/2014    Procedure: EGD;  Surgeon: Colon Branch, MD;  Location: [facility];  Service: Gastroenterology;  Laterality: N/A;  egd w/ bx    EGD  1975    EGD, COLONOSCOPY N/A 04/04/2018    Procedure: EGD with bxs, COLONOSCOPY with polypectomy and clipping;  Surgeon: Doyne Keel, MD;  Location: [facility];  Service: Gastroenterology;  Laterality: N/A;    FRACTURE SURGERY  AGE 78     RT  ANKLE- CAST APPLIED    HERNIA REPAIR  AGE 47    LEFT INGUINAL HERNIA    MPI nuclear study  06/2016    orthopedic surgery  AGE 50    RT foot corrective - HAMMERTOE    TONSILLECTOMY AND ADENOIDECTOMY  1954         X-Rays/Tests/Labs:  XR Chest AP Portable    Result Date: 03/12/2022  No acute abnormality. Debera Lat Merchant 03/12/2022 4:11 AM    CT Abdomen Pelvis WO IV/ WO PO Cont    Result Date: 03/11/2022  1. Retroperitoneal, inguinal and mesenteric adenopathy. Differential possibilities include lymphoma or metastatic disease. Percutaneous biopsy is recommended. 2. Moderate scrotal wall  thickening. 3. Other findings as above. Jasmine December D'Heureux, MD 03/11/2022 8:41 AM    Chest AP Portable  Result Date: 03/09/2022  No acute cardiopulmonary process. Legrand Pitts, MD 03/09/2022 1:03 PM       Social History:  Prior Level of Function  Prior level of function: Independent with ADLs, Ambulates independently  Baseline Activity Level: Community ambulation  Driving: independent  Cooking: Yes  Employment: PT  Home Living Arrangements  Living Arrangements: Spouse/significant other  Type of Home: House  Home Layout: Able to live on main level with bedroom/bathroom, Performs ADL's on one level  Bathroom Shower/Tub: Pension scheme manager: Standard  Bathroom Equipment: Built-in shower seat, Hand-held shower, Grab bars in shower  Bathroom Accessibility: Accessible      Subjective:   Patient is agreeable to participation in the therapy session. Nursing clears patient for therapy. Communicated with PA, pt was cleared to get out of bed to go to the bathroom with PT.   Patient Goal  Patient Goal: "I want to get better."  Pain Assessment  Pain Assessment: Numeric Scale (0-10)  Pain Score: 3-mild pain  POSS Score: Awake and Alert  Pain Location: Abdomen  Pain Orientation: Lower  Pain Descriptors: Cramping    Objective:   Observation of Patient/Vital Signs:  Patient is in bed with telemetry in place.    Inspection/Posture  Inspection/Posture: Fwd head; edema to Scrotol edema    Cognition/Neuro Status  Arousal/Alertness: Appropriate responses to stimuli  Orientation Level: Oriented X4  Memory: Appears intact  Following Commands: Follows all commands and directions without difficulty  Safety Awareness: independent  Problem Solving: Able to problem solve independently  Behavior: calm;cooperative;attentive  Motor Planning: intact  Coordination: intact  Hand Dominance: right handed      Hearing: WNL  Vision: WNL  Sensation: WNL    Gross ROM  Right Lower Extremity ROM: within functional limits  Left Lower Extremity ROM:  within functional limits  Gross Strength  Right Lower Extremity Strength: 3/5  Left Lower Extremity Strength: 3/5  Tone  Tone: within functional limits    Functional Mobility  Rolling: Contact Guard Assist  Supine to Sit: Contact Guard Assist  Scooting to EOB: Contact Guard Assist  Sit to Supine: Minimal Assist  Sit to Stand: Minimal Assist  Stand to Sit: Minimal Assist  Transfers  Bed to Chair: Minimal Assist  Chair to Bed: Minimal Assist  Stand Pivot Transfers: Minimal Assist  Device Used for Functional Transfer: front-wheeled walker  Locomotion  Ambulation: Minimal Assist;with front-wheeled walker  Pattern: decreased cadence;decreased step length;Step to  Distance Walked (ft) (Step 6,7): 10 Feet  PMP Activity: Step 6 - Walks in Room     Balance  Balance: needs focused assessment  Sitting - Static: Good  Sitting - Dynamic: Good  Standing - Static: Fair (with RW)  Standing - Dynamic: Fair (with RW)    Participation and Endurance  Participation Effort: good  Endurance: Tolerates < 10 min exercise, no significant change in vital signs         AM-PACT Inpatient Short Forms  Inpatient AM-PACT Performed? (PT): Basic Mobility Inpatient Short Form  AM-PACT "6 Clicks" Basic Mobility Inpatient Short Form  Turning Over in Bed: A little  Sitting Down On/Standing From Armchair: A little  Lying on Back to Sitting on Side of Bed: A little  Assist Moving to/from Bed to Chair: A little  Assist to Walk in Hospital Room: A little  Assist to Climb 3-5 Steps with Railing: A lot  PT Basic Mobility Raw Score: 17  CMS 0-100% Score: 50.57%    Treatment Activities:  Patient participated in PT eval and tx. Educated the patient to role of physical therapy, plan of care, goals of therapy and safety with mobility with use of RW. Therapeutic activity with facilitate patient with rolling in bed with verbal instruction and tactile instruction on rolling R with use of bed rail and position limbs for weight shifting for successful rolling to  decrease pressure to bony area and increase skin integrity. Facilitating patient on bed mobility with verbal instruction for body positioning, as well as proper sequence and hand and legs placement for supine to sit and sit to supine for energy conservation and increase safety. Work on sitting balance with weight shift side to side at EOB with feet touching the floor. Verbal and tactile instruction with hand and feet placement, increase body fwd leaning for proper leverage to perform sit to stand and using proper hands/feet placement and body mechanics for stand pivot transfer to increase safety.     Pt needs to have a BM and requested to walk to the bathroom. Instructed patient to move RW forward slightly, step forward with one lower extremity, and then step forward with another lower extremity while pushing RW forward. Instructed patient in the importance of remaining inside the legs of the walker. Instruct patient to avoid getting too close to the front bars of the walker for better balance. Advised patient to continue with this pattern until comfortable and confident with movement prior to ambulate with increase cadence and increase step length.      Patient was also educated on performing bed ex with ankle pump, quad sets and gluteal sets x 10 reps to increase LEs strength with verbal and tactile instruction for good form of ex with full ROM and isolated movement.        Educated the patient to role of physical therapy, plan of care, goals of therapy and safety with mobility and ADLs.    Patient was left with bed alarm and call bell within reach. Communicated with nursing on patient's performance.        Therapist PPE during session procedural mask and gloves      Roxi Hlavaty Eric Form, DPT  [facility]   Physical Medicine and Rehabilitation Dept

## 2022-03-13 NOTE — UM Notes (Signed)
Patient Name: [name] [name]   Patient DOB: 09-24-1946     Reference Auth # REF# [id number]  Review for DOS: Clinical update for 03/13/22     C/b to [name] [phone]  [facility] Utilization Review   NPI #: [id number]   Tax ID#  [id number]  Department Phone#: [phone]  Please fax final authorization and requests for additional information to [phone]       CSR 03/13/22 in [facility] :      DIAGNOSIS    ICD-10-CM    1. Acute on chronic congestive heart failure, unspecified heart failure type  I50.9       2. Shortness of breath  R06.02       3. Scrotal swelling  N50.89           Reason for Continued Stay: Tx to [facility] yesterday due to hypotension, req Levophed gtt, likely due to over-diuresis / Persistant scrotal and penile edema, despite aggressive diuresis on the floor    Vitals:   Temp:  [97.6 F (36.4 C)-98.8 F (37.1 C)]   Heart Rate:  [77-101]   Resp Rate:  [3-30]   BP: (84-119)/(44-81)   SpO2:  [85 %-100 %]   Height:  [182.9 cm (6')]   Weight:  [95.6 kg (210 lb 12.2 oz)]   BMI (calculated):  [28.6]     RR 24-26-25  Sats to 85% on RA    BPs 99/59-96/64-95/63    Abnormal Labs:    Glucose 234-260, BUN 51, Cr 2.1, WBC 38.71, H&H 11.8/35.8, RBC 3.44    Testing/Procedures:      Continue Plan of Care:      Per ID 03/13/22 :  Assessment:      Condition: Guarded  Perinephric stranding; pyelonephritis?  Unlikely  Retroperitoneal, inguinal, mesenteric lymphadenopathy  CLL  Congestive heart failure  Cardiomyopathy  S/p ICD placement  Diabetes mellitus  Left hydrocele  Chronic kidney disease  Hypertension  Coronary artery disease  Chronic obstructive pulmonary disease  Hyperlipidemia  Sleep apnea  Depression     Plan:      We will monitor without antibiotics  Hematology follow-up  Cardiology follow-up  Duction of electrolytes  Physical therapy  Discussed with treatment team      Cardio consult 03/13/22 :  Assessment:   Recent admission for acute on chronic systolic CHF and scrotal edema  EF 25 to 30% by  echo August 2022.  Moderate tricuspid and mitral regurgitation present.  PA pressures were 54 mmHg.   Nonischemic cardiomyopathy with EF as low as 15% May 2021, now up to 25 to 30% by echo August 2022  Negative cardiac catheterization for coronary disease 2014  Medtronic ICD in place  Diabetes  Hypertension  CLL   Recommendations:   Continue to hold diuretics and GDMT (Coreg and Entresto)   Continue to wean levophed gtt for MAP >60.   Will plan to reintroduce Coreg and Entresto as able once blood pressures improve   Recommend consult with general surgery team regarding his scrotal edema suspected from CLL        Surgery consult 03/13/22 :  Called re: scrotal edema.  Previously seen Urology for scrotal edema.  Concern over residual edema despite diuresis for CHF.  Also with CLL and concern for possible edema related to lymphadenopathy.  My team does not perform inguinal lymphadenectomies.  We also feel this is unlikely to help the edema and may even worsen it.  Recommend conservative measures for scrotal edema and consideration  for Urology consult.      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 Edwyna Shell for moderate complex left hydrocele  Discussed plan with patient and nurse      [facility] MD assessment/plan 03/13/22 :  Admitting diagnosis: #Hypotension from over diuresis  - crystalloid bolus  - Wean Levophed for MAP goals >12mmHg  - Appreciate Cardiology recommendations  - holding Coreg, Entresto and Lasix     Neuro: Hx of MDD and GAD  - Con't home Paxil     Cardio:  #HTN, HLD, CAD, ICM with HFrEF, ICD  - Appreciate Cardiology recommendations  - holding Coreg and Lasix  - Con't ASA, Lipitor     Resp: Hx of COPD, currently stable.  - Supplemental O2 as needed for SpO2  >94%  - Breo-Ellipta qAM    GI: NAI  - Reg diet     Infectious Disease (ID): leukocytosis, worsening. Hx of CLL.   - ID consulted, recommends discontinuing antibiotics as there is no concerns for infectious source at this time.   Discussed with Dr. Abundio Miu, Heme/onc, given CLL leukocytosis in stress is common. No signs of active infection and monitoring off antibiotic at this time.      Hem/Onc: #Hx of CLL  - Oncology following, lymphadenopathy from CLL  -Lymphadenopathy resulting in vascular stasis of his scrotum.      -Recommending surgical evaluation for lymphectomy.   - WBC at baseline per patient's outpatient records  - VTE ppx: Lovenox inj & SCDs     Renal:  #Left Hydrocele and scrotal edema  - 5/24 CT A/P with moderate scrotal wall thickening and intraabdominal adenopathy suspected from CLL  - Scrotum edematous but not tender on palpation and no crepitus noted  - 5/19 Scrotal US with left hydrocele  5/26 Discussed with Trauma Surgery, Dr. Angelia Mould, recommended calling Urology for scrotal evaluation. Patient follows with OP urologist which only goes to [facility]. He recommended diuresis then reevaluation of hydrocele. Discussed with our on call urologist, Dr. Eligah East. Further recommendations pending. No signs of torsion    Endo:  DM2  - f/u A1c  - Lantus + ISS      PCU to [facility] 03/12/22, tele monitor, diet as tol, fluid rest , external urinary catheter, daily EKG, supp O2, I/O, VS q4, RT, fall prec, Cardio consult, ID consult, URO consult    Current Facility-Administered Medications   Medication Dose Route Frequency    aspirin EC  81 mg Oral Daily    atorvastatin  40 mg Oral QHS    enoxaparin  40 mg Subcutaneous Q24H SCH    fluticasone furoate-vilanterol  1 puff Inhalation QAM    insulin glargine  20 Units Subcutaneous QAM    insulin lispro  1-4 Units Subcutaneous QHS    insulin lispro  1-8 Units Subcutaneous TID AC    niacin  500 mg Oral BID    PARoxetine  20 mg Oral QAM     Current Facility-Administered  Medications   Medication Dose Route Frequency Last Rate    norepinephrine  0-10 mcg/min Intravenous Continuous 5 mcg/min (03/12/22 2030)     Current Facility-Administered Medications   Medication Dose Route    acetaminophen  650 mg Oral  dextrose  15 g of glucose Oral    Or    dextrose  12.5 g Intravenous    Or    dextrose  12.5 g Intravenous    Or    glucagon (rDNA)  1 mg Intramuscular          This clinical review is based on/compiled from documentation provided by the treatment team within the patient's medical record.

## 2022-03-13 NOTE — Progress Notes (Signed)
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 He was aggressively diuresed on the floor for volume overload. He was upgraded to the [facility] for new hypotension requiring Levophed, suspected from over-diuresis.       Subjective/ Last 24 hour Events:     Scrotal edema is still the same, tenderness on the left side as before.  He reports tylenol has helped with this pain yesterday.     He is on 7 of levophed currently. Intake was almost a liter positive, and cumm. Fluid status remains -300cc (nearly euvolemic). Aggressively weaning levophed for a map >65.     Assessment:     Patient Active Problem List   Diagnosis    Type 2 diabetes mellitus with complication, with long-term current use of insulin    Heart attack    CAD (coronary artery disease)    Non-ischemic cardiomyopathy    Acute exacerbation of CHF (congestive heart failure)    PUD (peptic ulcer disease)    Acute dyspnea    CHF (congestive heart failure)    Headaches due to old head injury    Essential hypertension    Syncope and collapse    AICD (automatic cardioverter/defibrillator) present    Cardiomyopathy    SOB (shortness of breath)    GERD (gastroesophageal reflux disease)    Hypertensive heart disease without heart failure    Heart failure, unspecified    Nonischemic cardiomyopathy    Pure hypercholesterolemia    Chronic systolic congestive heart failure    Encounter for implantable defibrillator reprogramming or check    Episode of recurrent major depressive disorder, unspecified depression episode severity    Stage 3 chronic kidney disease, 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    Anxiety and depression    COPD (chronic obstructive pulmonary disease)    HLD (hyperlipidemia)    Sleep apnea, obstructive    Scrotal edema    Hydrocele, unspecified hydrocele type    Acute on chronic congestive heart failure, unspecified heart failure type       Plan:     Admitting diagnosis: #Hypotension from over diuresis  - crystalloid bolus  - Wean Levophed for MAP goals >62mmHg  - Appreciate Cardiology recommendations  - holding Coreg, Entresto and Lasix    Neuro: Hx of MDD and GAD  - Con't home Paxil    Cardio:  #HTN, HLD, CAD, ICM with HFrEF, ICD  - Appreciate Cardiology recommendations  - holding Coreg and Lasix  - Con't ASA, Lipitor    Resp: Hx of COPD, currently stable.  - Supplemental O2 as needed for SpO2 >94%  - Breo-Ellipta qAM    GI: NAI  - Reg diet    Infectious Disease (ID): leukocytosis, worsening. Hx of CLL.   - ID consulted, recommends discontinuing antibiotics as there is no concerns for infectious source at this time.   Discussed with Dr. Abundio Miu, Heme/onc, given CLL leukocytosis in stress is common. No signs of active infection and monitoring off antibiotic at this time.  Hem/Onc: #Hx of CLL  - Oncology following, lymphadenopathy from CLL  -Lymphadenopathy resulting in vascular stasis of his scrotum.     -Recommending surgical evaluation for lymphectomy.   - WBC at baseline per patient's outpatient records  - VTE ppx: Lovenox inj & SCDs    Renal:  #Left Hydrocele and scrotal edema  - 5/24 CT A/P with moderate scrotal wall thickening and intraabdominal adenopathy suspected from CLL  - Scrotum edematous but not tender on palpation and no crepitus noted  - 5/19 Scrotal US with left hydrocele  5/26 Discussed with Trauma Surgery, Dr. Angelia Mould, recommended calling Urology for scrotal evaluation. Patient follows with OP urologist which only goes to [facility]. He recommended diuresis then reevaluation of hydrocele. Discussed with our on call urologist, Dr. Eligah East. Further recommendations pending. No signs of torsion      Endo:  DM2  - f/u A1c  - Lantus + ISS      Patient has BMI=Body mass index is 28.58 kg/m.  Diagnosis: No additional diagnosis based on BMI criteria         Recent Labs     03/13/22  0228 03/12/22  1448 03/12/22  0349 03/11/22  2325 03/11/22  0611   Sodium 135 136 135 134* 136       Diagnosis: Mild Hyponatremia     GENERAL CRITICAL CARE ASSESSMENT/ PLANS- IF APPLICABLE    Vascular Access: PIV   Central line? If yes day#  GI Prophylaxis: N/A  VTE Prophylaxis: Lovenox Inj  Nutrition: Reg Diet  Sedation: none  Foley Catheter: none  AM Labs and Chest xray ordered: yes  Code Status:  full code  Disposition: [facility]      Hospital Course:   5/22: Admitted to Floor for volume overload. Diuresis with lasix 80mg  BID And GDMT  5/23: GDMT decreased  5/24: ID consulted for leukocytosis. Mild hypotension. GDMT held  2/25: Upgraded to [facility] for hypotension    Medications:   Scheduled Meds:  Current Facility-Administered Medications   Medication Dose Route Frequency    aspirin EC  81 mg Oral Daily    atorvastatin  40 mg Oral QHS    enoxaparin  40 mg Subcutaneous Q24H SCH    fluticasone furoate-vilanterol  1 puff Inhalation QAM    insulin glargine  20 Units Subcutaneous QAM    insulin lispro  1-4 Units Subcutaneous QHS    insulin lispro  1-8 Units Subcutaneous TID AC    niacin  500 mg Oral BID    PARoxetine  20 mg Oral QAM       Continuous Infusions:   norepinephrine 5 mcg/min (03/12/22 2030)        Physical Exam:     Vitals:    03/13/22 0821   BP:    Pulse: 89   Resp: 19   Temp:    SpO2: 96%         Intake/Output Summary (Last 24 hours) at 03/13/2022 0858  Last data filed at 03/13/2022 0600  Gross per 24 hour   Intake 2511.2 ml   Output 1650 ml   Net 861.2 ml         Physical Exam  Vitals reviewed.   Constitutional:       General: He is not in acute distress.     Appearance: He is not toxic-appearing.   HENT:      Head: Normocephalic.      Mouth/Throat:      Mouth: Mucous  membranes are  moist.      Pharynx: Oropharynx is clear.   Eyes:      Extraocular Movements: Extraocular movements intact.      Pupils: Pupils are equal, round, and reactive to light.   Cardiovascular:      Rate and Rhythm: Normal rate and regular rhythm.      Pulses: Normal pulses.      Heart sounds: Normal heart sounds.   Pulmonary:      Effort: Pulmonary effort is normal.      Breath sounds: Normal breath sounds. No wheezing, rhonchi or rales.   Abdominal:      General: Abdomen is flat. Bowel sounds are normal.      Palpations: Abdomen is soft.   Genitourinary:     Comments: Edematous scrotum. Minimal TTP. No crepitus   No skin changes noted  Musculoskeletal:         General: Normal range of motion.      Cervical back: Normal range of motion.      Right lower leg: No edema.      Left lower leg: No edema.   Skin:     General: Skin is warm and dry.      Capillary Refill: Capillary refill takes 2 to 3 seconds.   Neurological:      General: No focal deficit present.      Mental Status: He is alert and oriented to person, place, and time.      Motor: No weakness.         Data:     Invasive Critical Care Hemodynamics:                   Vent Settings:         Lines/Drains/Airways:    [REMOVED] External Urinary Catheter (Removed)   Collection Container Suction canister (external cath only) 03/10/22 1626   Securement Method Securement device 03/10/22 1626   Site Assessment WDL 03/10/22 1626   Device Assessment Intact;No Air Leak;Connected to Suction 03/10/22 1626   Interventions Changed 03/10/22 1626   Urine Output (mL) 1200 mL 03/10/22 1626   Number of days: 2       [REMOVED] External Urinary Catheter (Removed)   Urine Output (mL) 650 mL 03/11/22 0615   Number of days: 0       [REMOVED] External Urinary Catheter (Removed)   Collection Container Suction canister (external cath only) 03/12/22 2200   Securement Method Securement device 03/12/22 2200   Site Assessment WDL 03/12/22 2200   Device Assessment Other (Comment)  03/13/22 0200   Interventions Changed 03/12/22 1915   Urine Output (mL) 300 mL 03/12/22 2200   Number of days: 2                      Labs:     Labs (last 72 hours):  Recent Labs     03/13/22  0228 03/12/22  0349 03/11/22  2325   WBC 38.71* 26.09* 25.21*   Hgb 11.8* 11.0* 11.3*   Hematocrit 35.8* 32.8* 33.8*       No results for input(s): PT, INR, PTT in the last 72 hours. Recent Labs     03/13/22  0228 03/12/22  1448 03/12/22  0349 03/11/22  2325 03/11/22  0611   Sodium 135 136 135 134* 136   Potassium 4.1 4.2 4.0 4.6 4.1   Chloride 101 101 100 99 104   CO2 21 21 21 23 20    BUN 51.0* 49.0*  51.0* 50.0* 41.0*   Creatinine 2.1* 2.2* 2.3* 2.2* 1.8*   Glucose 260* 301* 267* 257* 164*   Calcium 8.3 8.3 8.3 8.2 8.2   Magnesium 2.2  --  2.2 2.2 1.9   Phosphorus 3.4  --  3.6  --  3.7                 Rads:   Radiological Imaging personally reviewed, including:   No results found.      Critical care provider statement:     Critical care time (minutes):  43    I have personally reviewed the patient's history and 24 hour interval events, along with vitals, labs, radiology images and nursing.     Critical care time was exclusive of separately billable procedures and treating other patients and teaching time.   Critical care was time spent personally by me on one or more of the following activities: discussions with consultants, development of treatment plan with patient or surrogate, evaluation of patient's response to treatment, examination of patient, obtaining history from patient or surrogate, ordering and performing treatments and interventions, ordering and review of laboratory studies, ordering and review of radiographic studies, pulse oximetry, re-evaluation of patient's condition and review of old charts.   At least one organ system is acutely impaired.   There is a high probability of imminent, life-threatening deterioration.   I intervened to try to prevent further deterioration of the patient's condition.         Signed by:  Lavella Lemons, NP-C                       [facility]              03/13/22 8:58 AM             Medical Intensive Care Unit           Ext. [phone]          Intermediate Care Unit                       MD Ext: [phone]          NP Ext. [phone]           8p-8a provider Coverage Ex [phone]    Attending Attestation:   I have directly reviewed the clinical findings, labs, imaging studies and management of this patient in detail. I have interviewed and examined the patient and agree with the documentation, assessment and plan of Juanito Vernell Leep as recorded by Lavella Lemons, NP     Judson Roch, MD

## 2022-03-13 NOTE — Consults (Signed)
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 Edwyna Shell for moderate complex left hydrocele  Discussed plan with patient and nurse  History:   [name] [name] is a 76 y.o. male who presents to the hospital on 03/09/2022 with mild shortness of breath and persistent scrotal edema.  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 He was aggressively diuresed on the floor for volume overload. He was upgraded to the [facility] for new hypotension requiring Levophed, suspected from over-diuresis.     He saw his urologist Dr. Edwyna Shell at [facility] for scrotal edema.  He was advised to see his cardiologist.  Primary team discussed case with acute care surgery.  They do not perform inguinal lymphadenectomy and recommended urology consult.    Creatinine 2.1.  5/24 CT A/P with moderate scrotal wall thickening and intraabdominal adenopathy suspected from CLL. 5/19 Scrotal US showed moderate sized complex left hydrocele with septations, scrotal wall edema without fluid collection, bilateral testicular blood flow on Doppler, no mass, 5 mm right epididymal head cyst with left hydrocele.    Past Medical History:     Past Medical History:   Diagnosis Date    Acute CHF     NOV  & DEC 2018, - DIALYSIS CATH PLACED Aug 24 2017 TO REMOVE FLUID     Acute systolic (congestive) heart failure 08/2017    Anxiety      CAD (coronary artery disease)     Cardiomyopathy     nonischemic    CHF (congestive heart failure) 08/12/2014, 2013    Chronic obstructive pulmonary disease     POSSIBLE PER PT HE USES  SYNBICORT BID ABD  BREO INHALER PRN    Coronary artery disease 2011    CORONARY STENT PLACEMENT FOLLOWED BY [facility]    Depression     echocardiogram 02/2016, 11/2016, 09/2017, 12/20/2017    GERD (gastroesophageal reflux disease)     Heart attack 2011    and 2014    Hyperlipemia     Hypertension     ICD (implantable 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     PLACED 2014  [facility]  LAST INTERROGATION April 01 2018 REPORT REQUESTED    Ischemic cardiomyopathy     EF 15% ON ECHO 09-20-2017 DR GARG IN EPIC    Nonischemic cardiomyopathy     NSTEMI (non-ST elevated myocardial infarction)     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    Pacemaker 2014    MEDTRONIC ICD/PACEMAKER COMBO LAST INTERROGATION  12-12 2018    Pneumonia 09/2016    Primary cardiomyopathy     Ischemic cardiomyopathy EF 15% ON ECHO 09-20-2017 DR GARG IN EPIC    Sepsis 08/2017    Syncope and collapse     PRIOR TO ICD/PACEMAKER PLACED 2014    Type 2 diabetes mellitus, controlled DX 1998    BS  AVG 100  A1C 7.4 Dec 27 2017    [place]eze     PT SEE HIS PMD April 01 2018 RE THIS-TOLD TO SEE PMD BY [facility] JUNE 12 WHEN THEY SAW HIM FOR A CL     Past Surgical History:     Past Surgical History:   Procedure Laterality Date    BIV  11/10/2016    ICD METRONIC  UPGRADE     CARDIAC CATHETERIZATION  03/2010    CORONARY STENT PLACEMENT X 2 PER PT; LM normal, LAD 70-80% distal lesion at apex, 20% lesion mid CFX    CARDIAC CATHETERIZATION  12/2012    CARDIAC DEFIBRILLATOR PLACEMENT  12/2012    [facility]    CARDIAC PACEMAKER PLACEMENT  2014    MEDTRONIC ICD/PACEMAKER PLACED [facility]    CIRCUMCISION  AGE 89    COLONOSCOPY  2009    CORRECTION HAMMER TOE      duodenal ulcer  1973    STRESS RELATED IN SCHOOL    ECHOCARDIOGRAM, TRANSTHORACIC  02/2016 11/2016 09/2017 12/2017    ECHOCARDIOGRAM, TRANSTHORACIC   02/2016,11/2016,12/20/2017    EGD N/A 08/15/2014    Procedure: EGD;  Surgeon: Colon Branch, MD;  Location: [facility];  Service: Gastroenterology;  Laterality: N/A;  egd w/ bx    EGD  1975    EGD, COLONOSCOPY N/A 04/04/2018    Procedure: EGD with bxs, COLONOSCOPY with polypectomy and clipping;  Surgeon: Doyne Keel, MD;  Location: [facility];  Service: Gastroenterology;  Laterality: N/A;    FRACTURE SURGERY  AGE 45     RT  ANKLE- CAST APPLIED    HERNIA REPAIR  AGE 89    LEFT INGUINAL HERNIA    MPI nuclear study  06/2016    orthopedic surgery  AGE 47    RT foot corrective - HAMMERTOE    TONSILLECTOMY AND ADENOIDECTOMY  1954     Family History:     Family History   Problem Relation Age of Onset    Breast cancer Mother     Heart attack Mother 43    Hypertension Mother     Diabetes Mother     Coronary artery disease Mother     Diabetes Brother     Heart attack Father     Hypertension Father      Social History:     Social History     Socioeconomic History    Marital status: Married     Spouse name: Teacher, music    Number of children: 0    Years of education: Not on file    Highest education level: Not on file   Occupational History    Occupation: Runner, 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, [place]    Tobacco Use    Smoking status: Never    Smokeless tobacco: Never   Vaping Use    Vaping status: Never Used   Substance and Sexual Activity    Alcohol use: No     Alcohol/week: 0.0 standard drinks of alcohol     Comment: occasionally    Drug use: No    Sexual activity: Yes     Partners: Female   Other Topics Concern    Not on file   Social History Narrative    Not on file     Social Determinants of Health     Financial Resource Strain: Not on file   Food Insecurity: Not on file   Transportation Needs: Not on file   Physical Activity:  Not on file   Stress: Not on file   Social Connections: Not on file   Intimate Partner Violence: Not on file   Housing Stability: Not on file     Allergies:   No Known Allergies  Medications:      Prior to Admission medications    Medication Sig Start Date End Date Taking? Authorizing Provider   aspirin EC 81 MG EC tablet Take 1 tablet (81 mg) by mouth daily   Yes [provider]   atorvastatin (LIPITOR) 40 MG tablet Take 1 tablet (40 mg total) by mouth nightly 07/16/21  Yes Benton, Elliot Dally, MD   carvedilol (COREG) 25 MG tablet Take 1 tablet (25 mg total) by mouth 2 (two) times daily with meals 04/24/21  Yes Marianna Fuss, MD   empagliflozin (JARDIANCE) 10 MG tablet Take 1 tablet (10 mg total) by mouth daily 04/24/21  Yes Marianna Fuss, MD   fluticasone-salmeterol (ADVAIR DISKUS) 100-50 MCG/ACT Aerosol Pwdr, Breath Activated Inhale 1 puff into the lungs 2 (two) times daily 02/25/22   Zorita Pang, MD   glucose blood (ONE TOUCH ULTRA TEST) test strip 1 each by Other route 2 (two) times daily Use as instructed 04/18/21   Zorita Pang, MD   hydrocodone-chlorpheniramine (TUSSIONEX) 10-8 MG/5ML extended-release suspension Take 5 mLs by mouth 2 (two) times daily 11/24/21   Zorita Pang, MD   insulin glargine (LANTUS SOLOSTAR) 100 UNIT/ML injection pen Patient takes 50 units at night 04/18/21   Zorita Pang, MD   Insulin Pen Needle (BD Pen Needle Nano U/F) 32G X 4 MM Misc Use as directed bid 12/02/21   Zorita Pang, MD   metOLazone (ZAROXOLYN) 2.5 MG tablet Take 1 tablet (2.5 mg total) by mouth once a week 06/11/21 06/11/22  Marianna Fuss, MD   niacin (NIASPAN) 500 MG CR tablet TAKE 1 TABLET BY MOUTH TWICE DAILY 02/23/22   Zorita Pang, MD   PARoxetine (Paxil) 20 MG tablet Take 1 tablet (20 mg total) by mouth every morning 10/30/19   Zorita Pang, MD   sacubitril-valsartan Sherryll Burger) 97-103 MG Tab per tablet Take 1 tablet by mouth 2 (two) times daily 09/04/21   Conception Oms, NP   Semglee, yfgn, 100 UNIT/ML Solution Pen-injector ADMINISTER 50 UNITS UNDER THE SKIN AT BEDTIME 02/07/22   Zorita Pang, MD   SITagliptin-metFORMIN (Janumet) 50-500 MG tablet Take 1 tablet by mouth 2 (two) times daily with  meals 01/08/22   Veligeti, Blinda Leatherwood, MD   spironolactone (ALDACTONE) 25 MG tablet Take 0.5 tablets (12.5 mg total) by mouth daily 04/24/21 04/24/22  Marianna Fuss, MD   torsemide (Demadex) 20 MG tablet Take 2 tablets (40 mg) by mouth every morning AND 1 tablet (20 mg) every evening. 11/12/21   Marianna Fuss, MD      Review of Systems:   A comprehensive review of systems was: General ROS: negative  Psychological ROS: negative for depression, anxiety  Ophthalmic ROS: negative vision changes, eye discharge  ENT ROS: negative for headaches, nasal congestion or visual changes  Allergy and Immunology ROS: negative for nasal congestion or seasonal allergies  Hematological and Lymphatic ROS: negative for bleeding problems, blood clots, night sweats or weight loss  Endocrine ROS: negative for malaise/lethargy, palpitations or skin changes  Breast ROS: negative for new or changing breast lumps  Respiratory ROS: negative for cough, orthopnea, shortness of breath or tachypnea  Cardiovascular ROS: negative for chest pain, dyspnea on exertion or edema  Gastrointestinal ROS: negative  for abdominal pain, blood in stools, change in bowel habits or constipation  Genito-Urinary ROS: negative for change in urinary stream, genital discharge or nocturia  Musculoskeletal ROS: negative for gait disturbance, muscle pain or muscular weakness  Neurological ROS: negative for behavioral changes, gait disturbance, headaches, impaired coordination/balance or numbness/tingling  Dermatological ROS: negative for dry skin, hair changes, lumps and rash  Physical Exam:   Temp:  [97.4 F (36.3 C)-98.4 F (36.9 C)] 97.4 F (36.3 C)  Heart Rate:  [82-97] 97  Resp Rate:  [15-31] 29  BP: (71-116)/(44-81) 97/62  Intake and Output Summary (Last 24 hours) at Date Time    Intake/Output Summary (Last 24 hours) at 03/13/2022 1935  Last data filed at 03/13/2022 1915  Gross per 24 hour   Intake 838 ml   Output 800 ml   Net 38 ml     Body mass index is 28.58  kg/m.  General appearance - alert, well appearing, and in no distress  Mental status - alert, oriented to person, place, and time  Neck - supple, no significant adenopathy  Lymphatics - no palpable lymphadenopathy  Abdomen - soft, nontender, nondistended, no masses or organomegaly  Back exam - full range of motion, no tenderness, palpable spasm or pain on motion  Neurological - alert, oriented, normal speech, no focal findings or movement disorder noted  Musculoskeletal - no joint tenderness, deformity or swelling  Extremities - peripheral pulses normal, no pedal edema, no clubbing or cyanosis  Skin - normal coloration and turgor, no rashes, no suspicious skin lesions noted  GU shows scrotum swollen to the size of a small cantaloupe, no fluctuance/erythema/heat.  Penile edema.  Spermatic cord difficult to appreciate due to edema but within normal limits  Labs Reviewed:     Results       Procedure Component Value Units Date/Time    Glucose Whole Blood - POCT [[id number]]  (Abnormal) Collected: 03/13/22 1645     Updated: 03/13/22 1647     Whole Blood Glucose POCT 181 mg/dL     Glucose Whole Blood - POCT [[id number]]  (Abnormal) Collected: 03/13/22 1214     Updated: 03/13/22 1219     Whole Blood Glucose POCT 213 mg/dL     Glucose Whole Blood - POCT [[id number]]  (Abnormal) Collected: 03/13/22 0803     Updated: 03/13/22 0835     Whole Blood Glucose POCT 234 mg/dL     Hemoglobin Y8M [[id number]] Collected: 03/13/22 0228    Specimen: Blood Updated: 03/13/22 0627     Hemoglobin A1C 5.4 %      Average Estimated Glucose 108.3 mg/dL     Narrative:      This is NOT the correct Test for Patients with  Hemoglobinopathy.    Culture Blood Aerobic and Anaerobic [[id number]] Collected: 03/12/22 0058    Specimen: Blood, Venipuncture Updated: 03/13/22 0102    Narrative:      The order will result in two separate 8-60ml bottles  Please do NOT order repeat blood cultures if one has been  drawn within the last 48 hours  UNLESS concerned  for  endocarditis  AVOID BLOOD CULTURE DRAWS FROM CENTRAL LINE IF POSSIBLE  Indications:->Other  Other->hypotension  ORDER#: [id number]                                    ORDERED BY: MANZELLA, SALVA  SOURCE: Blood, Venipuncture Left Side  COLLECTED:  03/12/22 00:58  ANTIBIOTICS AT COLL.:                                RECEIVED :  03/12/22 05:54  Culture Blood Aerobic and Anaerobic        PRELIM      03/13/22 06:25  03/13/22   No Growth after 1 day/s of incubation.      Culture Blood Aerobic and Anaerobic [[id number]] Collected: 03/12/22 0058    Specimen: Blood, Venipuncture Updated: 03/13/22 4098    Narrative:      The order will result in two separate 8-13ml bottles  Please do NOT order repeat blood cultures if one has been  drawn within the last 48 hours  UNLESS concerned for  endocarditis  AVOID BLOOD CULTURE DRAWS FROM CENTRAL LINE IF POSSIBLE  Indications:->Other  Other->hypotension  ORDER#: [id number]                                    ORDERED BY: MANZELLA, SALVA  SOURCE: Blood, Venipuncture RH                       COLLECTED:  03/12/22 00:58  ANTIBIOTICS AT COLL.:                                RECEIVED :  03/12/22 05:54  Culture Blood Aerobic and Anaerobic        PRELIM      03/13/22 06:25  03/13/22   No Growth after 1 day/s of incubation.      Phosphorus [[id number]] Collected: 03/13/22 0228    Specimen: Blood Updated: 03/13/22 0255     Phosphorus 3.4 mg/dL     Narrative:      This is NOT the correct Test for Patients with  Hemoglobinopathy.    Basic Metabolic Panel [[id number]]  (Abnormal) Collected: 03/13/22 0228    Specimen: Blood Updated: 03/13/22 0255     Glucose 260 mg/dL      BUN 28.4 mg/dL      Creatinine 2.1 mg/dL      Calcium 8.3 mg/dL      Sodium 132 mEq/L      Potassium 4.1 mEq/L      Chloride 101 mEq/L      CO2 21 mEq/L      Anion Gap 13.0     eGFR 31.9 mL/min/1.73 m2     Narrative:      This is NOT the correct Test for Patients with  Hemoglobinopathy.    Magnesium [[id number]]  Collected: 03/13/22 0228    Specimen: Blood Updated: 03/13/22 0255     Magnesium 2.2 mg/dL     Narrative:      This is NOT the correct Test for Patients with  Hemoglobinopathy.    CBC without differential [[id number]]  (Abnormal) Collected: 03/13/22 0228    Specimen: Blood Updated: 03/13/22 0241     WBC 38.71 x10 3/uL      Hgb 11.8 g/dL      Hematocrit 42.5 %      Platelets 175 x10 3/uL      RBC 3.44 x10 6/uL      MCV 104.1 fL      MCH 34.3 pg      MCHC 33.0 g/dL  RDW 14 %      MPV 12.0 fL      Nucleated RBC 0.0 /100 WBC      Absolute NRBC 0.00 x10 3/uL     Narrative:      This is NOT the correct Test for Patients with  Hemoglobinopathy.    Glucose Whole Blood - POCT [[id number]]  (Abnormal) Collected: 03/12/22 2058     Updated: 03/12/22 2101     Whole Blood Glucose POCT 229 mg/dL             Rads:     Radiology Results (24 Hour)       ** No results found for the last 24 hours. **             Signed by: Roxanne Mins, MD, MD  [facility]  [phone] [facility] office

## 2022-03-13 NOTE — OT Progress Note (Signed)
Occupational Therapy Cancellation Note    Patient: [name] [name]  ZOX:[id number]    Unit: J811/B147-W    Patient not seen for occupational therapy secondary to Patient transferred to [facility] on 03/12/22 due to hypotension. Spoke with Dr. Murvin Natal who requested to hold therapy services today due to hypotension and re-attempt tomorrow, 03/14/22. P: Will continue to follow as appropriate.     Tennis Ship. Trixie Deis, MS,OTR/L,CBIS  [phone]

## 2022-03-13 NOTE — Progress Notes (Signed)
Infectious Disease            Progress Note    03/13/2022   [name] [name] ZOX:[id number] is a 76 y.o. male, with history significant for diabetes mellitus, hypertension, chronic kidney disease, anxiety, depression, sleep apnea, COPD, hyperlipidemia, congestive heart failure, nonischemic cardiomyopathy, status post AICD placement, CAD with leukocytosis, lymphadenopathy.    Subjective:     [name] [name] today Symptoms: Afebrile, still significant leukocytosis, H&H stable, no vomiting or diarrhea, cultures negative. Other review of system is non contributory.    Objective:     Blood pressure 106/73, pulse 89, temperature 97.6 F (36.4 C), temperature source Oral, resp. rate 19, height 1.829 m (6'), weight 95.6 kg (210 lb 12.2 oz), SpO2 96 %.    General Appearance: Looks comfortable  HEENT: Pallor negative, Anicteric sclera.    Neck:    Supple  Lungs: Decreased breath sound at bases.    Chest Wall: Symmetric chest wall expansion.   Heart : S1 and S2.   Abdomen: Abdomen is soft, bowel sounds positive.  Neurological: Alert and oriented to person, place and time.  Moves all extremities  Extremities: Lower extremity edema  Skin/scrotum: Scrotal swelling and tenderness  Psychiatric: Mood and affect is normal    Laboratory And Diagnostic Studies:     Recent Labs     03/13/22  0228 03/12/22  0349 03/11/22  2325   WBC 38.71* 26.09* 25.21*   Hgb 11.8* 11.0* 11.3*   Hematocrit 35.8* 32.8* 33.8*   Platelets 175 155 160   Segmented Neutrophils  --  17 8       Recent Labs     03/13/22  0228 03/12/22  1448   Sodium 135 136   Potassium 4.1 4.2   Chloride 101 101   CO2 21 21   BUN 51.0* 49.0*   Creatinine 2.1* 2.2*   Glucose 260* 301*   Calcium 8.3 8.3       Recent Labs     03/12/22  0349 03/11/22  0611   AST (SGOT) 8 8   ALT 9 8   Alkaline Phosphatase 65 65   Protein, Total 5.4* 5.4*   Albumin 2.8* 2.6*   Bilirubin, Total 1.5* 1.2         Current Med's:     Current Facility-Administered Medications   Medication  Dose Route Frequency    aspirin EC  81 mg Oral Daily    atorvastatin  40 mg Oral QHS    enoxaparin  40 mg Subcutaneous Q24H SCH    fluticasone furoate-vilanterol  1 puff Inhalation QAM    insulin glargine  20 Units Subcutaneous QAM    insulin lispro  1-4 Units Subcutaneous QHS    insulin lispro  1-8 Units Subcutaneous TID AC    niacin  500 mg Oral BID    PARoxetine  20 mg Oral QAM       Lines/Drains:     Patient Lines/Drains/Airways Status       Active Lines, Drains and Airways       Name Placement date Placement time Site Days    Peripheral IV 03/09/22 20 G Standard Anterior;Distal;Right Upper Arm 03/09/22  1455  Upper Arm  3    Peripheral IV 03/12/22 22 G Left;Posterior Forearm 03/12/22  0100  Forearm  1                    Assessment:     Condition: Guarded  Perinephric stranding; pyelonephritis?  Unlikely  Retroperitoneal, inguinal, mesenteric lymphadenopathy  CLL  Congestive heart failure  Cardiomyopathy  S/p ICD placement  Diabetes mellitus  Left hydrocele  Chronic kidney disease  Hypertension  Coronary artery disease  Chronic obstructive pulmonary disease  Hyperlipidemia  Sleep apnea  Depression    Plan:     We will monitor without antibiotics  Hematology follow-up  Cardiology follow-up  Duction of electrolytes  Physical therapy  Discussed with treatment team          Alfonzo Beers, MD, M.D.,FACP  03/13/2022  8:52 AM          *This note was generated by the Epic EMR system/ Dragon speech recognition and may contain inherent errors or omissions not intended by the user. Grammatical errors, random word insertions, deletions, pronoun errors and incomplete sentences are occasional consequences of this technology due to software limitations. Not all errors are caught or corrected. If there are questions or concerns about the content of this note or information contained within the body of this dictation they should be addressed directly with the author for clarification

## 2022-03-14 DIAGNOSIS — E785 Hyperlipidemia, unspecified: Secondary | ICD-10-CM

## 2022-03-14 DIAGNOSIS — I255 Ischemic cardiomyopathy: Secondary | ICD-10-CM

## 2022-03-14 LAB — BASIC METABOLIC PANEL
Anion Gap: 12 (ref 5.0–15.0)
BUN: 59 mg/dL — ABNORMAL HIGH (ref 9.0–28.0)
CO2: 22 mEq/L (ref 17–29)
Calcium: 8.5 mg/dL (ref 7.9–10.2)
Chloride: 100 mEq/L (ref 99–111)
Creatinine: 1.9 mg/dL — ABNORMAL HIGH (ref 0.5–1.5)
Glucose: 206 mg/dL — ABNORMAL HIGH (ref 70–100)
Potassium: 4.4 mEq/L (ref 3.5–5.3)
Sodium: 134 mEq/L — ABNORMAL LOW (ref 135–145)
eGFR: 36 mL/min/{1.73_m2} — AB (ref >=60)

## 2022-03-14 LAB — MAN DIFF ONLY
Band Neutrophils Absolute: 1.53 10*3/uL — ABNORMAL HIGH (ref 0.00–1.00)
Band Neutrophils: 5 %
Basophils Absolute Manual: 0 10*3/uL (ref 0.00–0.08)
Basophils Manual: 0 %
Eosinophils Absolute Manual: 0.31 10*3/uL (ref 0.00–0.44)
Eosinophils Manual: 1 %
Lymphocytes Absolute Manual: 22.92 10*3/uL — ABNORMAL HIGH (ref 0.42–3.22)
Lymphocytes Manual: 75 %
Monocytes Absolute: 0.31 10*3/uL (ref 0.21–0.85)
Monocytes Manual: 1 %
Neutrophils Absolute Manual: 5.5 10*3/uL (ref 1.10–6.33)
Segmented Neutrophils: 18 %

## 2022-03-14 LAB — CBC AND DIFFERENTIAL
Absolute NRBC: 0 10*3/uL (ref 0.00–0.00)
Hematocrit: 35.5 % — ABNORMAL LOW (ref 37.6–49.6)
Hgb: 11.9 g/dL — ABNORMAL LOW (ref 12.5–17.1)
Instrument Absolute Neutrophil Count: 3.99 10*3/uL (ref 1.10–6.33)
MCH: 34.6 pg — ABNORMAL HIGH (ref 25.1–33.5)
MCHC: 33.5 g/dL (ref 31.5–35.8)
MCV: 103.2 fL — ABNORMAL HIGH (ref 78.0–96.0)
MPV: 12.2 fL (ref 8.9–12.5)
Nucleated RBC: 0 /100 WBC (ref 0.0–0.0)
Platelets: 166 10*3/uL (ref 142–346)
RBC: 3.44 10*6/uL — ABNORMAL LOW (ref 4.20–5.90)
RDW: 14 % (ref 11–15)
WBC: 30.56 10*3/uL — ABNORMAL HIGH (ref 3.10–9.50)

## 2022-03-14 LAB — CELL MORPHOLOGY
Cell Morphology: ABNORMAL — AB
Platelet Estimate: NORMAL

## 2022-03-14 LAB — GLUCOSE WHOLE BLOOD - POCT
Whole Blood Glucose POCT: 228 mg/dL — ABNORMAL HIGH (ref 70–100)
Whole Blood Glucose POCT: 245 mg/dL — ABNORMAL HIGH (ref 70–100)
Whole Blood Glucose POCT: 239 mg/dL — ABNORMAL HIGH (ref 70–100)
Whole Blood Glucose POCT: 235 mg/dL — ABNORMAL HIGH (ref 70–100)

## 2022-03-14 LAB — ECG 12-LEAD
Atrial Rate: 77 {beats}/min
P Axis: 58 degrees
P-R Interval: 144 ms
Q-T Interval: 422 ms
QRS Duration: 88 ms
QTC Calculation (Bezet): 477 ms
R Axis: -20 degrees
T Axis: 84 degrees
Ventricular Rate: 77 {beats}/min

## 2022-03-14 LAB — MAGNESIUM: Magnesium: 2.4 mg/dL (ref 1.6–2.6)

## 2022-03-14 LAB — PHOSPHORUS: Phosphorus: 3.6 mg/dL (ref 2.3–4.7)

## 2022-03-14 MED ORDER — INSULIN LISPRO 100 UNIT/ML SOLN (WRAP)
1.0000 [IU] | Freq: Every evening | Status: DC
Start: 2022-03-14 — End: 2022-03-20
  Administered 2022-03-14: 2 [IU] via SUBCUTANEOUS
  Administered 2022-03-15: 4 [IU] via SUBCUTANEOUS
  Administered 2022-03-16: 2 [IU] via SUBCUTANEOUS
  Administered 2022-03-18 – 2022-03-19 (×2): 1 [IU] via SUBCUTANEOUS
  Filled 2022-03-14: qty 3
  Filled 2022-03-14: qty 6
  Filled 2022-03-14: qty 3
  Filled 2022-03-14: qty 6
  Filled 2022-03-14: qty 9
  Filled 2022-03-14: qty 3

## 2022-03-14 MED ORDER — NYSTATIN 100000 UNIT/GM EX OINT
TOPICAL_OINTMENT | Freq: Two times a day (BID) | CUTANEOUS | Status: DC
Start: 2022-03-14 — End: 2022-03-17
  Filled 2022-03-14: qty 15

## 2022-03-14 MED ORDER — INSULIN LISPRO 100 UNIT/ML SOLN (WRAP)
2.0000 [IU] | Freq: Three times a day (TID) | Status: DC
Start: 2022-03-14 — End: 2022-03-20
  Administered 2022-03-14 – 2022-03-15 (×3): 4 [IU] via SUBCUTANEOUS
  Administered 2022-03-15: 6 [IU] via SUBCUTANEOUS
  Administered 2022-03-15 – 2022-03-16 (×3): 4 [IU] via SUBCUTANEOUS
  Administered 2022-03-17 (×2): 2 [IU] via SUBCUTANEOUS
  Administered 2022-03-17: 4 [IU] via SUBCUTANEOUS
  Administered 2022-03-18: 2 [IU] via SUBCUTANEOUS
  Administered 2022-03-19 – 2022-03-20 (×2): 4 [IU] via SUBCUTANEOUS
  Filled 2022-03-14 (×5): qty 12
  Filled 2022-03-14: qty 6
  Filled 2022-03-14 (×2): qty 12
  Filled 2022-03-14: qty 18
  Filled 2022-03-14: qty 3
  Filled 2022-03-14: qty 6
  Filled 2022-03-14: qty 12
  Filled 2022-03-14: qty 9
  Filled 2022-03-14: qty 6

## 2022-03-14 MED ORDER — LIDOCAINE HCL 4 % EX SOLN
Freq: Four times a day (QID) | CUTANEOUS | Status: DC | PRN
Start: 2022-03-14 — End: 2022-03-20
  Filled 2022-03-14: qty 50

## 2022-03-14 NOTE — Progress Notes (Signed)
HEART CARDIOLOGY PROGRESS NOTE    [facility]  [facility] INTERMEDIATE CARE UNIT  [address]  [place] [address]  Dept: [phone]  Dept Fax: [phone]  Loc: [phone]    Date Time: 03/14/22 1:31 PM  Patient Name: [name] [name] Day: 5    Subjective/Cardiac Relevant Events:   No acute events overnight.  Renal function improving, weight stable off diuretics.  BP 90s-100s/50s-70s.    ASSESSMENT:   Patient is a 76 y.o. male with the following relevant diagnoses:    Recent admission for acute on chronic systolic CHF and scrotal edema  EF 25 to 30% by echo August 2022.  Moderate tricuspid and mitral regurgitation present.  PA pressures were 54 mmHg.   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  May ultimately need to transition ARNI to low dose ARB.  Would continue to hold diuretics today, as weight is stable and renal function improving.  May resume oral diuretics as soon as tomorrow.    Telemetry/Labs Reviewed/Intake-Output:     Telemetry reviewed: NSR, occasional PVCs/NSVT    Recent Labs   Lab 03/11/22  2325 03/09/22  1740   hs Troponin-I 14.5 19.9   hs Troponin-I Delta  --  1.3             Recent Labs   Lab 03/12/22  0349 03/11/22  0611 03/10/22  0604   Bilirubin, Total 1.5*  More results in Results Review 1.1   Bilirubin Direct  --   --  0.4   Protein, Total 5.4*  More results in Results Review 5.3*   Albumin 2.8*  More results in Results Review 2.8*   ALT 9  More results in Results Review 9   AST (SGOT) 8  More results in Results Review 9   More results in Results Review = values in this interval not displayed.     Recent Labs   Lab 03/14/22  0337   Magnesium 2.4         Recent Labs   Lab  03/14/22  0424 03/13/22  0228 03/12/22  0349   WBC 30.56* 38.71* 26.09*   Hgb 11.9* 11.8* 11.0*   Hematocrit 35.5* 35.8* 32.8*   Platelets 166 175 155     Recent Labs   Lab 03/14/22  0337 03/13/22  0228 03/12/22  1448   Sodium 134* 135 136   Potassium 4.4 4.1 4.2   Chloride 100 101 101   CO2 22 21 21    BUN 59.0* 51.0* 49.0*   Creatinine 1.9* 2.1* 2.2*   eGFR 36.0* 31.9* 30.2*   Glucose 206* 260* 301*   Calcium 8.5 8.3 8.3           Invalid input(s): FREET4    .  Lab Results   Component Value Date    BNP 3,115 (H) 03/11/2022          Intake/Output Summary (Last 24 hours) at 03/14/2022 1331  Last data filed at 03/14/2022 1007  Gross per 24 hour   Intake 752 ml   Output 600 ml   Net 152 ml       Medications:  Scheduled Meds: PRN Meds:    aspirin EC, 81 mg, Oral, Daily  atorvastatin, 40 mg, Oral, QHS  enoxaparin, 40 mg, Subcutaneous, Q24H SCH  fluticasone furoate-vilanterol, 1 puff, Inhalation, QAM  insulin glargine, 20 Units, Subcutaneous, QAM  insulin lispro, 1-6 Units, Subcutaneous, QHS  insulin lispro, 2-10 Units, Subcutaneous, TID AC  niacin, 500 mg, Oral, BID  PARoxetine, 20 mg, Oral, QAM  polyethylene glycol, 17 g, Oral, Daily  senna-docusate, 1 tablet, Oral, QHS        Continuous Infusions:   acetaminophen, 650 mg, Q6H PRN  dextrose, 15 g of glucose, PRN   Or  dextrose, 12.5 g, PRN   Or  dextrose, 12.5 g, PRN   Or  glucagon (rDNA), 1 mg, PRN          Physical Exam:     Vitals:    03/14/22 1000 03/14/22 1007 03/14/22 1100 03/14/22 1200   BP: 97/59  104/64 103/58   Pulse: 96  98 (!) 103   Resp: (!) 24  (!) 26 21   Temp:  99.1 F (37.3 C)     TempSrc:  Temporal     SpO2: 93%  91% 96%   Weight:       Height:             03/09/2022    10:54 AM 03/09/2022    12:24 PM 03/09/2022     5:30 PM 03/10/2022     4:09 AM 03/11/2022     4:45 AM 03/12/2022    12:32 AM 03/13/2022     6:00 AM   Weight Monitoring   Height 182.9 cm 182.9 cm     182.9 cm   Height Method  Stated     Stated   Weight 102.513 kg 99.338 kg 98.113 kg 97.614 kg  96.299 kg 95.7 kg 95.6 kg   Weight Method  Stated Standing Scale Standing Scale Standing Scale Bed Scale Bed Scale   BMI (calculated) 30.7 kg/m2 29.8 kg/m2     28.6 kg/m2       GENERAL: Patient is in no acute distress   HEENT: Moist mucous membranes   NECK: No jugular venous distention  CARDIAC: Regular rate and rhythm, with normal S1 and S2, and no murmurs,  CHEST: Clear to auscultation bilaterally, normal respiratory effort  ABDOMEN: Soft, nontender, non-distended, good bowel sounds   EXTREMITIES: No edema, +scrotal edema, 2+ radial pulses palpable   SKIN: No rash or jaundice   NEUROLOGIC: Alert and oriented to time, place and person, normal mood and affect    MUSCULOSKELETAL: Normal muscle strength and tone.    -------------------------------------------------------------------------------------  Signed by:         Verner Mould, MD         [facility]    [facility] Heart Contact Information   [facility]  Secure Chat (Group):   [facility] [place] [facility]    APP Spectralink:  [phone]    MD Spectralink :  [phone]  [phone]    After hours, non urgent consult line:  [phone]    After hours, physician on-call:  [phone] [facility]  Secure Chat (Group):   [facility] [place] [facility]    APP Spectralink:  [phone]    MD Spectralink :  [phone]      After hours, non urgent consult line:  [phone]    After hours, physician on-call:  [phone] [facility]  Secure Chat (Group):   [place] [facility]    APP Spectralink:  [phone]  MD Spectralink :  [phone]      After hours, non urgent consult line:  [phone]    After hours, physician on-call:  [place] (Group):   [place] [facility]    APP [place]:  [phone]    MD [place] :  [phone]      After hours, non urgent consult line:  [phone]    After hours, physician on-call:  [phone]       This note was generated by the Dragon speech recognition and may contain errors or omissions not intended  by the user. Grammatical errors, random word insertions, deletions, pronoun errors, and incomplete sentences are occasional consequences of this technology due to software limitations. Not all errors are caught or corrected. If there are questions or concerns about the content of this note or information contained within the body of this dictation, they should be addressed directly with the author for clarification.

## 2022-03-14 NOTE — Progress Notes (Signed)
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 Edwyna ShellHart for moderate complex left hydrocele    Subjective:   [name] MichaelisBruce [name] Askin is a 76 y.o. male who presents to the hospital on 03/09/2022 with mild shortness of breath and persistent scrotal edema.  I was consulted by Dianna Rossettiyann Blue NP for my opinion on scrotal edema/hydrocele     27M with a PMH of GAD, MDD, CLL, HTN, HLD, CAD, NICM with HFrEF (25-30%), ICD, GERD, DM2. He was aggressively diuresed on the floor for volume overload. [facility] requiring Levophed, suspected from over-diuresis.      He saw his urologist Dr. Edwyna [facility] at [facility] for scrotal edema.  He was advised to see his cardiologist.  Primary team discussed case with acute care surgery.  They do not perform inguinal lymphadenectomy and recommended urology consult.     Creatinine 1.9 from 2.1.  5/24 CT A/P with moderate scrotal wall thickening and intraabdominal adenopathy suspected from CLL. 5/19 Scrotal US showed moderate sized complex left hydrocele with septations, scrotal wall edema without fluid collection, bilateral testicular blood flow on Doppler, no mass, 5 mm right epididymal head cyst with left hydrocele.     Past Medical History:     Past Medical History:   Diagnosis Date    Acute CHF     NOV  & DEC 2018, - DIALYSIS CATH PLACED Aug 24 2017 TO REMOVE FLUID     Acute systolic (congestive) heart failure 08/2017    Anxiety     CAD (coronary artery disease)     Cardiomyopathy     nonischemic    CHF (congestive heart failure) 08/12/2014, 2013    Chronic obstructive pulmonary disease     POSSIBLE PER PT HE USES  SYNBICORT BID ABD  BREO INHALER PRN    Coronary  artery disease 2011    CORONARY STENT PLACEMENT FOLLOWED BY [facility]    Depression     echocardiogram 02/2016, 11/2016, 09/2017, 12/20/2017    GERD (gastroesophageal reflux disease)     Heart attack 2011    and 2014    Hyperlipemia     Hypertension     ICD (implantable 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     PLACED 2014  [facility]  LAST INTERROGATION April 01 2018 REPORT REQUESTED    Ischemic cardiomyopathy     EF 15% ON ECHO 09-20-2017 DR GARG IN EPIC    Nonischemic cardiomyopathy     NSTEMI (non-ST elevated myocardial infarction)     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    Pacemaker 2014    MEDTRONIC ICD/PACEMAKER COMBO LAST INTERROGATION  12-12 2018    Pneumonia 09/2016    Primary cardiomyopathy     Ischemic cardiomyopathy EF 15% ON ECHO 09-20-2017 DR GARG IN EPIC    Sepsis 08/2017    Syncope and collapse     PRIOR TO ICD/PACEMAKER PLACED 2014    Type 2 diabetes mellitus, controlled DX 1998    BS  AVG 100  A1C 7.4 Dec 27 2017    [place]     PT SEE HIS PMD April 01 2018 RE THIS-TOLD TO SEE PMD  BY [facility] JUNE [facility] WHEN THEY SAW HIM FOR A CL     Past Surgical History:     Past Surgical History:   Procedure Laterality Date    BIV  11/10/2016    ICD METRONIC  UPGRADE     CARDIAC CATHETERIZATION  03/2010    CORONARY STENT PLACEMENT X 2 PER PT; LM normal, LAD 70-80% distal lesion at apex, 20% lesion mid CFX    CARDIAC CATHETERIZATION  12/2012    CARDIAC DEFIBRILLATOR PLACEMENT  12/2012    [facility]    CARDIAC PACEMAKER PLACEMENT  2014    MEDTRONIC ICD/PACEMAKER PLACED [facility]    CIRCUMCISION  AGE 63    COLONOSCOPY  2009    CORRECTION HAMMER TOE      duodenal ulcer  1973    STRESS RELATED IN SCHOOL    ECHOCARDIOGRAM, TRANSTHORACIC  02/2016 11/2016 09/2017 12/2017    ECHOCARDIOGRAM, TRANSTHORACIC  02/2016,11/2016,12/20/2017    EGD N/A 08/15/2014    Procedure: EGD;  Surgeon: Colon Branch, MD;  [facility];  Service: Gastroenterology;  Laterality: N/A;  egd w/ bx    EGD  1975    EGD, COLONOSCOPY N/A 04/04/2018    Procedure: EGD  with bxs, COLONOSCOPY with polypectomy and clipping;  Surgeon: Doyne Keel, MD;  Location: [facility];  Service: Gastroenterology;  Laterality: N/A;    FRACTURE SURGERY  AGE 71     RT  ANKLE- CAST APPLIED    HERNIA REPAIR  AGE 63    LEFT INGUINAL HERNIA    MPI nuclear study  06/2016    orthopedic surgery  AGE 82    RT foot corrective - HAMMERTOE    TONSILLECTOMY AND ADENOIDECTOMY  1954     Family History:     Family History   Problem Relation Age of Onset    Breast cancer Mother     Heart attack Mother 50    Hypertension Mother     Diabetes Mother     Coronary artery disease Mother     Diabetes Brother     Heart attack Father     Hypertension Father      Social History:     Social History     Socioeconomic History    Marital status: Married     Spouse name: Teacher, music    Number of children: 0    Years of education: Not on file    Highest education level: Not on file   Occupational History    Occupation: Runner, 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[place], [place]    Tobacco Use    Smoking status: Never    Smokeless tobacco: Never   Vaping Use    Vaping status: Never Used   Substance and Sexual Activity    Alcohol use: No     Alcohol/week: 0.0 standard drinks of alcohol     Comment: occasionally    Drug use: No    Sexual activity: Yes     Partners: Female   Other Topics Concern    Not on file   Social History Narrative    Not on file     Social Determinants of Health     Financial Resource Strain: Not on file   Food Insecurity: Not on file   Transportation Needs: Not on file   Physical Activity: Not on file   Stress: Not on file   Social Connections: Not on file   Intimate Partner Violence: Not on file   Housing Stability:  Not on file     Allergies:   No Known Allergies  Medications:     Prior to Admission medications    Medication Sig Start Date End Date Taking? Authorizing Provider   aspirin EC 81 MG EC tablet Take 1 tablet (81 mg) by mouth daily   Yes [provider]   atorvastatin (LIPITOR) 40 MG tablet Take 1 tablet (40 mg  total) by mouth nightly 07/16/21  Yes Benton, Elliot Dally, MD   carvedilol (COREG) 25 MG tablet Take 1 tablet (25 mg total) by mouth 2 (two) times daily with meals 04/24/21  Yes Marianna Fuss, MD   empagliflozin (JARDIANCE) 10 MG tablet Take 1 tablet (10 mg total) by mouth daily 04/24/21  Yes Marianna Fuss, MD   fluticasone-salmeterol (ADVAIR DISKUS) 100-50 MCG/ACT Aerosol Pwdr, Breath Activated Inhale 1 puff into the lungs 2 (two) times daily 02/25/22   Zorita Pang, MD   glucose blood (ONE TOUCH ULTRA TEST) test strip 1 each by Other route 2 (two) times daily Use as instructed 04/18/21   Zorita Pang, MD   hydrocodone-chlorpheniramine (TUSSIONEX) 10-8 MG/5ML extended-release suspension Take 5 mLs by mouth 2 (two) times daily 11/24/21   Zorita Pang, MD   insulin glargine (LANTUS SOLOSTAR) 100 UNIT/ML injection pen Patient takes 50 units at night 04/18/21   Zorita Pang, MD   Insulin Pen Needle (BD Pen Needle Nano U/F) 32G X 4 MM Misc Use as directed bid 12/02/21   Zorita Pang, MD   metOLazone (ZAROXOLYN) 2.5 MG tablet Take 1 tablet (2.5 mg total) by mouth once a week 06/11/21 06/11/22  Marianna Fuss, MD   niacin (NIASPAN) 500 MG CR tablet TAKE 1 TABLET BY MOUTH TWICE DAILY 02/23/22   Zorita Pang, MD   PARoxetine (Paxil) 20 MG tablet Take 1 tablet (20 mg total) by mouth every morning 10/30/19   Zorita Pang, MD   sacubitril-valsartan Sherryll Burger) 97-103 MG Tab per tablet Take 1 tablet by mouth 2 (two) times daily 09/04/21   Conception Oms, NP   Semglee, yfgn, 100 UNIT/ML Solution Pen-injector ADMINISTER 50 UNITS UNDER THE SKIN AT BEDTIME 02/07/22   Zorita Pang, MD   SITagliptin-metFORMIN (Janumet) 50-500 MG tablet Take 1 tablet by mouth 2 (two) times daily with meals 01/08/22   Veligeti, Blinda Leatherwood, MD   spironolactone (ALDACTONE) 25 MG tablet Take 0.5 tablets (12.5 mg total) by mouth daily 04/24/21 04/24/22  Marianna Fuss, MD   torsemide (Demadex) 20 MG tablet Take 2 tablets (40 mg) by mouth every morning AND 1 tablet  (20 mg) every evening. 11/12/21   Marianna Fuss, MD      Review of Systems:   A comprehensive review of systems was: General ROS: negative  Psychological ROS: negative for depression, anxiety  Ophthalmic ROS: negative vision changes, eye discharge  ENT ROS: negative for headaches, nasal congestion or visual changes  Allergy and Immunology ROS: negative for nasal congestion or seasonal allergies  Hematological and Lymphatic ROS: negative for bleeding problems, blood clots, night sweats or weight loss  Endocrine ROS: negative for malaise/lethargy, palpitations or skin changes  Breast ROS: negative for new or changing breast lumps  Respiratory ROS: negative for cough, orthopnea, shortness of breath or tachypnea  Cardiovascular ROS: negative for chest pain, dyspnea on exertion or edema  Gastrointestinal ROS: negative for abdominal pain, blood in stools, change in bowel habits or constipation  Genito-Urinary ROS: negative for change in urinary stream, genital discharge or nocturia  Musculoskeletal ROS:  negative for gait disturbance, muscle pain or muscular weakness  Neurological ROS: negative for behavioral changes, gait disturbance, headaches, impaired coordination/balance or numbness/tingling  Dermatological ROS: negative for dry skin, hair changes, lumps and rash  Physical Exam:   Temp:  [97.8 F (36.6 C)-99.1 F (37.3 C)] 99.1 F (37.3 C)  Heart Rate:  [91-113] 106  Resp Rate:  [19-29] 22  BP: (86-119)/(54-78) 113/59  Intake and Output Summary (Last 24 hours) at Date Time    Intake/Output Summary (Last 24 hours) at 03/14/2022 2002  Last data filed at 03/14/2022 1800  Gross per 24 hour   Intake 970 ml   Output 600 ml   Net 370 ml     Body mass index is 28.58 kg/m.  General appearance - awake  Mental status - alert, oriented to person, place, and time  Neck - supple, no significant adenopathy  Lymphatics - no palpable lymphadenopathy  Abdomen - soft, nontender, nondistended, no masses or organomegaly  Back exam -  full range of motion, no tenderness, palpable spasm or pain on motion  Neurological - alert, oriented, normal speech, no focal findings or movement disorder noted  Musculoskeletal - no joint tenderness, deformity or swelling  Extremities - peripheral pulses normal, no pedal edema, no clubbing or cyanosis  Skin - normal coloration and turgor, no rashes, no suspicious skin lesions noted  GU shows scrotum swollen to the size of a small cantaloupe, no fluctuance/erythema/heat.  Penile edema.  Spermatic cord difficult to appreciate due to edema but within normal limits  Labs:     Results       Procedure Component Value Units Date/Time    Glucose Whole Blood - POCT [[id number]]  (Abnormal) Collected: 03/14/22 1621     Updated: 03/14/22 1623     Whole Blood Glucose POCT 239 mg/dL     Glucose Whole Blood - POCT [[id number]]  (Abnormal) Collected: 03/14/22 1153     Updated: 03/14/22 1159     Whole Blood Glucose POCT 245 mg/dL     Glucose Whole Blood - POCT [[id number]]  (Abnormal) Collected: 03/14/22 0711     Updated: 03/14/22 0715     Whole Blood Glucose POCT 228 mg/dL     Culture Blood Aerobic and Anaerobic [[id number]] Collected: 03/12/22 0058    Specimen: Blood, Venipuncture Updated: 03/14/22 8099    Narrative:      The order will result in two separate 8-64ml bottles  Please do NOT order repeat blood cultures if one has been  drawn within the last 48 hours  UNLESS concerned for  endocarditis  AVOID BLOOD CULTURE DRAWS FROM CENTRAL LINE IF POSSIBLE  Indications:->Other  Other->hypotension  ORDER#: [id number]                                    ORDERED BY: MANZELLA, SALVA  SOURCE: Blood, Venipuncture Left Side                COLLECTED:  03/12/22 00:58  ANTIBIOTICS AT COLL.:                                RECEIVED :  03/12/22 05:54  Culture Blood Aerobic and Anaerobic        PRELIM      03/14/22 06:27  03/13/22   No Growth after 1 day/s of incubation.  03/14/22   No  Growth after 2 day/s of incubation.      Culture Blood Aerobic  and Anaerobic [[id number]] Collected: 03/12/22 0058    Specimen: Blood, Venipuncture Updated: 03/14/22 4098    Narrative:      The order will result in two separate 8-28ml bottles  Please do NOT order repeat blood cultures if one has been  drawn within the last 48 hours  UNLESS concerned for  endocarditis  AVOID BLOOD CULTURE DRAWS FROM CENTRAL LINE IF POSSIBLE  Indications:->Other  Other->hypotension  ORDER#: [id number]                                    ORDERED BY: MANZELLA, SALVA  SOURCE: Blood, Venipuncture RH                       COLLECTED:  03/12/22 00:58  ANTIBIOTICS AT COLL.:                                RECEIVED :  03/12/22 05:54  Culture Blood Aerobic and Anaerobic        PRELIM      03/14/22 06:27  03/13/22   No Growth after 1 day/s of incubation.  03/14/22   No Growth after 2 day/s of incubation.      Basic Metabolic Panel [[id number]]  (Abnormal) Collected: 03/14/22 0337    Specimen: Blood Updated: 03/14/22 0512     Glucose 206 mg/dL      BUN 78.4 mg/dL      Creatinine 1.9 mg/dL      Calcium 8.5 mg/dL      Sodium 696 mEq/L      Potassium 4.4 mEq/L      Chloride 100 mEq/L      CO2 22 mEq/L      Anion Gap 12.0     eGFR 36.0 mL/min/1.73 m2     Magnesium [[id number]] Collected: 03/14/22 0337    Specimen: Blood Updated: 03/14/22 0512     Magnesium 2.4 mg/dL     Phosphorus [[id number]] Collected: 03/14/22 0337    Specimen: Blood Updated: 03/14/22 0512     Phosphorus 3.6 mg/dL     Cell MorpHology [[id number]]  (Abnormal) Collected: 03/14/22 0424     Updated: 03/14/22 0504     Cell Morphology Abnormal     Platelet Estimate Normal     Macrocytic 2+     Smudge Cells Present     Platelet Clumps Present    Manual Differential [[id number]]  (Abnormal) Collected: 03/14/22 0424     Updated: 03/14/22 0504     Segmented Neutrophils 18 %      Band Neutrophils 5 %      Lymphocytes Manual 75 %      Monocytes Manual 1 %      Eosinophils Manual 1 %      Basophils Manual 0 %      Neutrophils Absolute Manual 5.50 x10 3/uL       Band Neutrophils Absolute 1.53 x10 3/uL      Lymphocytes Absolute Manual 22.92 x10 3/uL      Monocytes Absolute 0.31 x10 3/uL      Eosinophils Absolute Manual 0.31 x10 3/uL      Basophils Absolute Manual 0.00 x10 3/uL     CBC and differential [[id number]]  (Abnormal) Collected: 03/14/22 0424    Specimen: Blood  Updated: 03/14/22 0504     WBC 30.56 x10 3/uL      Hgb 11.9 g/dL      Hematocrit 16.1 %      Platelets 166 x10 3/uL      RBC 3.44 x10 6/uL      MCV 103.2 fL      MCH 34.6 pg      MCHC 33.5 g/dL      RDW 14 %      MPV 12.2 fL      Instrument Absolute Neutrophil Count 3.99 x10 3/uL      Nucleated RBC 0.0 /100 WBC      Absolute NRBC 0.00 x10 3/uL     Glucose Whole Blood - POCT [[id number]]  (Abnormal) Collected: 03/13/22 2029     Updated: 03/13/22 2031     Whole Blood Glucose POCT 207 mg/dL             Rads:     Radiology Results (24 Hour)       ** No results found for the last 24 hours. **            Signed by: Roxanne Mins, MD, MD    [facility]  [phone] [facility] office

## 2022-03-14 NOTE — Plan of Care (Signed)
Pt a&Ox4.  BP soft but stable MAP 65>. Plan to transfer to floor. Keep scrotal sling on and out of bed to decrease edema. IS and Up to chair.      Problem: Day of Admission - Heart Failure  Goal: Heart Failure Admission  Outcome: Progressing     Problem: Everyday - Heart Failure  Goal: Stable Vital Signs and Fluid Balance  Outcome: Progressing  Goal: Mobility/Activity is Maintained at Optimal Level for Patient  Outcome: Progressing  Goal: Nutritional Intake is Adequate  Outcome: Progressing  Goal: Teaching-Using CHF Warning Zones and Educational Videos  Outcome: Progressing     Problem: Day of Discharge - Heart Failure  Goal: Discharge Education  Outcome: Progressing     Problem: Safety  Goal: Patient will be free from injury during hospitalization  Outcome: Progressing  Goal: Patient will be free from infection during hospitalization  Outcome: Progressing     Problem: Pain  Goal: Pain at adequate level as identified by patient  Outcome: Progressing     Problem: Side Effects from Pain Analgesia  Goal: Patient will experience minimal side effects of analgesic therapy  Outcome: Progressing     Problem: Discharge Barriers  Goal: Patient will be discharged home or other facility with appropriate resources  Outcome: Progressing     Problem: Psychosocial and Spiritual Needs  Goal: Demonstrates ability to cope with hospitalization/illness  Outcome: Progressing     Problem: Moderate/High Fall Risk Score >5  Goal: Patient will remain free of falls  Outcome: Progressing     Problem: Fluid and Electrolyte Imbalance/ Endocrine  Goal: Fluid and electrolyte balance are achieved/maintained  Outcome: Progressing  Goal: Adequate hydration  Outcome: Progressing     Problem: Diabetes: Glucose Imbalance  Goal: Blood glucose stable at established goal  Outcome: Progressing

## 2022-03-14 NOTE — Progress Notes (Signed)
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 He was aggressively diuresed on the floor for volume overload. He was upgraded to the [facility] for new hypotension requiring Levophed, suspected from over-diuresis.       Subjective/ Last 24 hour Events:     Scrotal edema is still the same, tenderness on the left side as before.  He has pain with moisture associated sore     Assessment:     Patient Active Problem List   Diagnosis    Type 2 diabetes mellitus with complication, with long-term current use of insulin    Heart attack    CAD (coronary artery disease)    Non-ischemic cardiomyopathy    Acute exacerbation of CHF (congestive heart failure)    PUD (peptic ulcer disease)    Acute dyspnea    CHF (congestive heart failure)    Headaches due to old head injury    Essential hypertension    Syncope and collapse    AICD (automatic cardioverter/defibrillator) present    Cardiomyopathy    SOB (shortness of breath)    GERD (gastroesophageal reflux disease)    Hypertensive heart disease without heart failure    Heart failure, unspecified    Nonischemic cardiomyopathy    Pure hypercholesterolemia    Chronic systolic congestive heart failure    Encounter for implantable defibrillator reprogramming or check    Episode of recurrent major depressive disorder, unspecified depression episode severity    Stage 3 chronic kidney disease, 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    Anxiety and depression    COPD (chronic obstructive pulmonary disease)    HLD (hyperlipidemia)    Sleep apnea, obstructive    Scrotal edema    Hydrocele, unspecified  hydrocele type    Acute on chronic congestive heart failure, unspecified heart failure type       Plan:     Admitting diagnosis: #Hypotension from over diuresis  - crystalloid bolus  - Wean Levophed for MAP goals >86mmHg  - Appreciate Cardiology recommendations  - holding Coreg, Entresto and Lasix    Neuro: Hx of MDD and GAD  - Con't home Paxil    Cardio:  #HTN, HLD, CAD, ICM with HFrEF, ICD  - Appreciate Cardiology recommendations  - holding Coreg and Lasix  - Con't ASA, Lipitor    Resp: Hx of COPD, currently stable.  - Supplemental O2 as needed for SpO2 >94%  - Breo-Ellipta qAM    GI: NAI  - Reg diet    Infectious Disease (ID): leukocytosis, worsening. Hx of CLL.   - ID consulted, recommends discontinuing antibiotics as there is no concerns for infectious source at this time.   Discussed with Dr. Abundio Miu, Heme/onc, given CLL leukocytosis in stress is common. No signs of active infection and monitoring off antibiotic at this time.     Hem/Onc: #Hx of CLL  - Oncology following, lymphadenopathy from CLL  -Lymphadenopathy resulting in vascular stasis of his scrotum.     -Recommending surgical evaluation for lymphectomy.   -  WBC at baseline per patient's outpatient records  - VTE ppx: Lovenox inj & SCDs    Renal:  #Left Hydrocele and scrotal edema  - 5/24 CT A/P with moderate scrotal wall thickening and intraabdominal adenopathy suspected from CLL  - Scrotum edematous but not tender on palpation and no crepitus noted  - 5/19 Scrotal US with left hydrocele  5/26 Discussed with Trauma Surgery, Dr. Angelia Mould, recommended calling Urology for scrotal evaluation. Patient follows with OP urologist which only goes to [facility]. He recommended diuresis then reevaluation of hydrocele. Discussed with our on call urologist, Dr. Eligah East. Further recommendations pending. No signs of torsion    Add nystatin and topical for left sided superficial moisture associated dermatitis.   Elevated scrotum.       Endo:  DM2  - f/u A1c  -  Lantus + ISS      Patient has BMI=Body mass index is 28.58 kg/m.  Diagnosis: No additional diagnosis based on BMI criteria           Recent Labs     03/14/22  0337 03/13/22  0228 03/12/22  1448 03/12/22  0349 03/11/22  2325   Sodium 134* 135 136 135 134*       Diagnosis: Mild Hyponatremia     GENERAL CRITICAL CARE ASSESSMENT/ PLANS- IF APPLICABLE    Vascular Access: PIV   Central line? If yes day#  GI Prophylaxis: N/A  VTE Prophylaxis: Lovenox Inj  Nutrition: Reg Diet  Sedation: none  Foley Catheter: none  AM Labs and Chest xray ordered: yes  Code Status:  full code  Disposition: [facility]      Hospital Course:   5/22: Admitted to Floor for volume overload. Diuresis with lasix 80mg  BID And GDMT  5/23: GDMT decreased  5/24: ID consulted for leukocytosis. Mild hypotension. GDMT held  5/25: Upgraded to [facility] for hypotension  5/27: elevated scrotum, left sided scrotal wound. Added nystatin and lidocaine topical.     Medications:   Scheduled Meds:  Current Facility-Administered Medications   Medication Dose Route Frequency    aspirin EC  81 mg Oral Daily    atorvastatin  40 mg Oral QHS    enoxaparin  40 mg Subcutaneous Q24H SCH    fluticasone furoate-vilanterol  1 puff Inhalation QAM    insulin glargine  20 Units Subcutaneous QAM    insulin lispro  1-4 Units Subcutaneous QHS    insulin lispro  1-8 Units Subcutaneous TID AC    niacin  500 mg Oral BID    PARoxetine  20 mg Oral QAM    polyethylene glycol  17 g Oral Daily    senna-docusate  1 tablet Oral QHS       Continuous Infusions:   norepinephrine Stopped (03/13/22 1057)        Physical Exam:     Vitals:    03/14/22 1007   BP:    Pulse:    Resp:    Temp: 99.1 F (37.3 C)   SpO2:          Intake/Output Summary (Last 24 hours) at 03/14/2022 1020  Last data filed at 03/14/2022 1007  Gross per 24 hour   Intake 875 ml   Output 600 ml   Net 275 ml         Physical Exam  Vitals reviewed.   Constitutional:       General: He is not in acute distress.     Appearance: He is not  toxic-appearing.  HENT:      Head: Normocephalic.      Mouth/Throat:      Mouth: Mucous membranes are moist.      Pharynx: Oropharynx is clear.   Eyes:      Extraocular Movements: Extraocular movements intact.      Pupils: Pupils are equal, round, and reactive to light.   Cardiovascular:      Rate and Rhythm: Normal rate and regular rhythm.      Pulses: Normal pulses.      Heart sounds: Normal heart sounds.   Pulmonary:      Effort: Pulmonary effort is normal.      Breath sounds: Normal breath sounds. No wheezing, rhonchi or rales.   Abdominal:      General: Abdomen is flat. Bowel sounds are normal.      Palpations: Abdomen is soft.   Genitourinary:     Penis: Erythema present.       Testes:         Left: Tenderness and testicular hydrocele present.      Tanner stage (genital): 5.          Comments: Edematous scrotum. Minimal TTP. No crepitus   No skin changes noted  Musculoskeletal:         General: Normal range of motion.      Cervical back: Normal range of motion.      Right lower leg: No edema.      Left lower leg: No edema.   Skin:     General: Skin is warm and dry.      Capillary Refill: Capillary refill takes 2 to 3 seconds.   Neurological:      General: No focal deficit present.      Mental Status: He is alert and oriented to person, place, and time.      Motor: No weakness.           Data:     Invasive Critical Care Hemodynamics:                   Vent Settings:         Lines/Drains/Airways:    [REMOVED] External Urinary Catheter (Removed)   Collection Container Suction canister (external cath only) 03/10/22 1626   Securement Method Securement device 03/10/22 1626   Site Assessment WDL 03/10/22 1626   Device Assessment Intact;No Air Leak;Connected to Suction 03/10/22 1626   Interventions Changed 03/10/22 1626   Urine Output (mL) 1200 mL 03/10/22 1626   Number of days: 2       [REMOVED] External Urinary Catheter (Removed)   Urine Output (mL) 650 mL 03/11/22 0615   Number of days: 0       [REMOVED] External  Urinary Catheter (Removed)   Collection Container Suction canister (external cath only) 03/13/22 0800   Securement Method Securement device 03/13/22 0800   Site Assessment WDL 03/13/22 0800   Device Assessment Other (Comment) 03/13/22 0200   Interventions Changed 03/12/22 1915   Urine Output (mL) 300 mL 03/12/22 2200   Number of days: 2                      Labs:     Labs (last 72 hours):  Recent Labs     03/14/22  0424 03/13/22  0228 03/12/22  0349   WBC 30.56* 38.71* 26.09*   Hgb 11.9* 11.8* 11.0*   Hematocrit 35.5* 35.8* 32.8*       No results for  input(s): PT, INR, PTT in the last 72 hours. Recent Labs     03/14/22  0337 03/13/22  0228 03/12/22  1448 03/12/22  0349   Sodium 134* 135 136 135   Potassium 4.4 4.1 4.2 4.0   Chloride 100 101 101 100   CO2 22 21 21 21    BUN 59.0* 51.0* 49.0* 51.0*   Creatinine 1.9* 2.1* 2.2* 2.3*   Glucose 206* 260* 301* 267*   Calcium 8.5 8.3 8.3 8.3   Magnesium 2.4 2.2  --  2.2   Phosphorus 3.6 3.4  --  3.6                 Rads:   Radiological Imaging personally reviewed, including:   No results found.      Critical care provider statement:     Critical care time (minutes):  43    I have personally reviewed the patient's history and 24 hour interval events, along with vitals, labs, radiology images and nursing.     Critical care time was exclusive of separately billable procedures and treating other patients and teaching time.   Critical care was time spent personally by me on one or more of the following activities: discussions with consultants, development of treatment plan with patient or surrogate, evaluation of patient's response to treatment, examination of patient, obtaining history from patient or surrogate, ordering and performing treatments and interventions, ordering and review of laboratory studies, ordering and review of radiographic studies, pulse oximetry, re-evaluation of patient's condition and review of old charts.   At least one organ system is acutely impaired.    There is a high probability of imminent, life-threatening deterioration.   I intervened to try to prevent further deterioration of the patient's condition.        Signed by:  Lavella Lemons, NP-C                       [facility]              03/14/22 10:20 AM             Medical Intensive Care Unit           Ext. [phone]          Intermediate Care Unit                       MD Ext: [phone]          NP Ext. [phone]           8p-8a provider Coverage Ex [phone]    Attending Attestation:   I have directly reviewed the clinical findings, labs, imaging studies and management of this patient in detail. I have interviewed and examined the patient and agree with the documentation, assessment and plan of [name] [name] as recorded by Lavella Lemons, NP     Caffie Damme, MD

## 2022-03-14 NOTE — Progress Notes (Signed)
Infectious Disease            Progress Note    03/14/2022   [name] [name] GHW:[id number] is a 76 y.o. male, with history significant for diabetes mellitus, hypertension, chronic kidney disease, anxiety, depression, sleep apnea, COPD, hyperlipidemia, congestive heart failure, nonischemic cardiomyopathy, status post AICD placement, CAD with leukocytosis, lymphadenopathy.    Subjective:     [name] [name] today Symptoms: Afebrile, comfortable, still leukocytosis H&H stable. Other review of system is non contributory.    Objective:     Blood pressure 99/58, pulse 96, temperature 99.1 F (37.3 C), temperature source Temporal, resp. rate (!) 26, height 1.829 m (6'), weight 95.6 kg (210 lb 12.2 oz), SpO2 92 %.    General Appearance: No acute distress  HEENT: Pallor negative, Anicteric sclera.    Neck:    Supple  Lungs: Decreased breath sound at bases.    Chest Wall: Symmetric chest wall expansion.   Heart : S1 and S2.   Abdomen: Abdomen is soft, bowel sounds positive.  Neurological: Alert and oriented to person, place and time.  Moves all extremities  Extremities: Lower extremity edema  Skin/scrotum: Scrotal swelling and tenderness  Psychiatric: Mood and affect is normal    Laboratory And Diagnostic Studies:     Recent Labs     03/14/22  0424 03/13/22  0228 03/12/22  0349   WBC 30.56* 38.71* 26.09*   Hgb 11.9* 11.8* 11.0*   Hematocrit 35.5* 35.8* 32.8*   Platelets 166 175 155   Segmented Neutrophils 18  --  17       Recent Labs     03/14/22  0337 03/13/22  0228   Sodium 134* 135   Potassium 4.4 4.1   Chloride 100 101   CO2 22 21   BUN 59.0* 51.0*   Creatinine 1.9* 2.1*   Glucose 206* 260*   Calcium 8.5 8.3       Recent Labs     03/12/22  0349   AST (SGOT) 8   ALT 9   Alkaline Phosphatase 65   Protein, Total 5.4*   Albumin 2.8*   Bilirubin, Total 1.5*         Current Med's:     Current Facility-Administered Medications   Medication Dose Route Frequency    aspirin EC  81 mg Oral Daily    atorvastatin   40 mg Oral QHS    enoxaparin  40 mg Subcutaneous Q24H SCH    fluticasone furoate-vilanterol  1 puff Inhalation QAM    insulin glargine  20 Units Subcutaneous QAM    insulin lispro  1-6 Units Subcutaneous QHS    insulin lispro  2-10 Units Subcutaneous TID AC    niacin  500 mg Oral BID    PARoxetine  20 mg Oral QAM    polyethylene glycol  17 g Oral Daily    senna-docusate  1 tablet Oral QHS       Lines/Drains:     Patient Lines/Drains/Airways Status       Active Lines, Drains and Airways       Name Placement date Placement time Site Days    Peripheral IV 03/09/22 20 G Standard Anterior;Distal;Right Upper Arm 03/09/22  1455  Upper Arm  4    Peripheral IV 03/12/22 22 G Left;Posterior Forearm 03/12/22  0100  Forearm  2    Peripheral IV 03/14/22 20 G Anterior;Distal;Right Forearm 03/14/22  0400  Forearm  less than 1  Assessment:     Condition: Guarded  Perinephric stranding; pyelonephritis?  Unlikely  Retroperitoneal, inguinal, mesenteric lymphadenopathy  CLL  Congestive heart failure  Cardiomyopathy  S/p ICD placement  Diabetes mellitus  Left hydrocele  Chronic kidney disease  Hypertension  Coronary artery disease  Chronic obstructive pulmonary disease  Hyperlipidemia  Sleep apnea  Depression    Plan:     We will monitor without antibiotics  Hematology follow-up  Cardiology follow-up  Correction of electrolytes  Physical therapy  Discussed with treatment team          Alfonzo Beers, MD, M.D.,FACP  03/14/2022  10:54 AM          *This note was generated by the Epic EMR system/ Dragon speech recognition and may contain inherent errors or omissions not intended by the user. Grammatical errors, random word insertions, deletions, pronoun errors and incomplete sentences are occasional consequences of this technology due to software limitations. Not all errors are caught or corrected. If there are questions or concerns about the content of this note or information contained within the body of this  dictation they should be addressed directly with the author for clarification

## 2022-03-15 LAB — CBC WITH MANUAL DIFFERENTIAL
Absolute NRBC: 0 10*3/uL (ref 0.00–0.00)
Atypical Lymphocytes %: 1 %
Atypical Lymphocytes Absolute: 0.34 10*3/uL — ABNORMAL HIGH (ref 0.00–0.00)
Band Neutrophils Absolute: 2.73 10*3/uL — ABNORMAL HIGH (ref 0.00–1.00)
Band Neutrophils: 8 %
Basophils Absolute Manual: 0 10*3/uL (ref 0.00–0.08)
Basophils Manual: 0 %
Cell Morphology: ABNORMAL — AB
Eosinophils Absolute Manual: 0.68 10*3/uL — ABNORMAL HIGH (ref 0.00–0.44)
Eosinophils Manual: 2 %
Hematocrit: 34.7 % — ABNORMAL LOW (ref 37.6–49.6)
Hgb: 11.5 g/dL — ABNORMAL LOW (ref 12.5–17.1)
Lymphocytes Absolute Manual: 26.23 10*3/uL — ABNORMAL HIGH (ref 0.42–3.22)
Lymphocytes Manual: 77 %
MCH: 33.8 pg — ABNORMAL HIGH (ref 25.1–33.5)
MCHC: 33.1 g/dL (ref 31.5–35.8)
MCV: 102.1 fL — ABNORMAL HIGH (ref 78.0–96.0)
MPV: 12 fL (ref 8.9–12.5)
Monocytes Absolute: 0.68 10*3/uL (ref 0.21–0.85)
Monocytes Manual: 2 %
Neutrophils Absolute Manual: 3.41 10*3/uL (ref 1.10–6.33)
Nucleated RBC: 0 /100 WBC (ref 0.0–0.0)
Platelet Estimate: NORMAL
Platelets: 186 10*3/uL (ref 142–346)
RBC: 3.4 10*6/uL — ABNORMAL LOW (ref 4.20–5.90)
RDW: 14 % (ref 11–15)
Segmented Neutrophils: 10 %
WBC: 34.07 10*3/uL — ABNORMAL HIGH (ref 3.10–9.50)

## 2022-03-15 LAB — PHOSPHORUS: Phosphorus: 3 mg/dL (ref 2.3–4.7)

## 2022-03-15 LAB — BASIC METABOLIC PANEL
Anion Gap: 11 (ref 5.0–15.0)
BUN: 58 mg/dL — ABNORMAL HIGH (ref 9.0–28.0)
CO2: 20 mEq/L (ref 17–29)
Calcium: 8.1 mg/dL (ref 7.9–10.2)
Chloride: 101 mEq/L (ref 99–111)
Creatinine: 1.7 mg/dL — ABNORMAL HIGH (ref 0.5–1.5)
Glucose: 288 mg/dL — ABNORMAL HIGH (ref 70–100)
Potassium: 4.6 mEq/L (ref 3.5–5.3)
Sodium: 132 mEq/L — ABNORMAL LOW (ref 135–145)
eGFR: 41.2 mL/min/{1.73_m2} — AB (ref >=60)

## 2022-03-15 LAB — GLUCOSE WHOLE BLOOD - POCT
Whole Blood Glucose POCT: 282 mg/dL — ABNORMAL HIGH (ref 70–100)
Whole Blood Glucose POCT: 246 mg/dL — ABNORMAL HIGH (ref 70–100)
Whole Blood Glucose POCT: 226 mg/dL — ABNORMAL HIGH (ref 70–100)
Whole Blood Glucose POCT: 261 mg/dL — ABNORMAL HIGH (ref 70–100)

## 2022-03-15 LAB — MAGNESIUM: Magnesium: 2.5 mg/dL (ref 1.6–2.6)

## 2022-03-15 MED ORDER — ONDANSETRON HCL 4 MG/2ML IJ SOLN
4.0000 mg | Freq: Three times a day (TID) | INTRAMUSCULAR | Status: DC | PRN
Start: 2022-03-15 — End: 2022-03-20
  Administered 2022-03-15: 4 mg via INTRAVENOUS
  Filled 2022-03-15: qty 2

## 2022-03-15 MED ORDER — INSULIN GLARGINE 100 UNIT/ML SC SOLN
5.0000 [IU] | Freq: Once | SUBCUTANEOUS | Status: AC
Start: 2022-03-15 — End: 2022-03-15
  Administered 2022-03-15: 5 [IU] via SUBCUTANEOUS
  Filled 2022-03-15: qty 5

## 2022-03-15 MED ORDER — FUROSEMIDE 20 MG PO TABS
20.0000 mg | ORAL_TABLET | Freq: Every day | ORAL | Status: DC
Start: 2022-03-15 — End: 2022-03-17
  Administered 2022-03-15 – 2022-03-17 (×2): 20 mg via ORAL
  Filled 2022-03-15 (×3): qty 1

## 2022-03-15 MED ORDER — STERILE WATER FOR INJECTION IJ/IV SOLN (WRAP)
2.0000 g | INTRAMUSCULAR | Status: DC
Start: 2022-03-15 — End: 2022-03-16
  Administered 2022-03-15 – 2022-03-16 (×2): 2 g via INTRAVENOUS
  Filled 2022-03-15 (×2): qty 2000

## 2022-03-15 MED ORDER — INSULIN GLARGINE 100 UNIT/ML SC SOLN
25.0000 [IU] | Freq: Every morning | SUBCUTANEOUS | Status: DC
Start: 2022-03-16 — End: 2022-03-16
  Administered 2022-03-16: 25 [IU] via SUBCUTANEOUS
  Filled 2022-03-15: qty 25

## 2022-03-15 MED ORDER — SODIUM CHLORIDE 0.9 % IV BOLUS
250.0000 mL | Freq: Once | INTRAVENOUS | Status: AC
Start: 2022-03-15 — End: 2022-03-15
  Administered 2022-03-15: 250 mL via INTRAVENOUS

## 2022-03-15 NOTE — Plan of Care (Signed)
Problem: Everyday - Heart Failure  Goal: Stable Vital Signs and Fluid Balance  Outcome: Progressing  Flowsheets (Taken 03/12/2022 1425 by Drexler, Trae, RN)  Stable Vital Signs and Fluid Balance:   Daily Standing Weights in the morning using the same scale, after using the bathroom and before breadfast.  If unable to stand, zero the bed and use the bed scale   Monitor, assess vital signs and telemetry per policy   Fluid Restriction   Assess for swelling/edema   Monitor labs and report abnormalities to physician  Goal: Mobility/Activity is Maintained at Optimal Level for Patient  Outcome: Progressing  Flowsheets (Taken 03/11/2022 1034 by Theodoro Doing, RN)  Mobility/Activity is Maintained at Optimal Level for Patient:   Increase mobility as tolerated/progressive mobility protocol   Reposition patient every 2 hours and as needed unless able to reposition self   Maintain SCD's as Ordered   Perform active/passive ROM   Assess for changes in respiratory status, level of consciousness and/or development of fatigue   Consult with Physical Therapy and/or Occupational Therapy  Goal: Nutritional Intake is Adequate  Outcome: Progressing  Flowsheets (Taken 03/11/2022 1034 by Theodoro Doing, RN)  Nutritional Intake is Adequate:   Cardiac diet-2 gm Sodium   Fluid Restricction if needed   Consult/Collaborate with Nutritionist   Assess appetite,anorexia and amount of meal/food tolerated   Encourage/perform oral hygiene as appropriate   Patient and family teaching on low sodium diet  Goal: Teaching-Using CHF Warning Zones and Educational Videos  Outcome: Progressing  Flowsheets (Taken 03/11/2022 1034 by Theodoro Doing, RN)  Teaching-Using CHF Warning Zones and Educational Videos:   Signs & Symptoms of CHF   Daily Standing Weights & record   CHF Warning Zones and when to call for help   Medications   Document in the Education Tab in EPIC with Teach-back   Fluid Restriction if appropriate   Sodium Restriction

## 2022-03-15 NOTE — Progress Notes (Signed)
CRITICAL CARE  [facility]    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 He was aggressively diuresed on the floor for volume overload. He was upgraded to the [facility] for new hypotension requiring Levophed, suspected from over-diuresis.       Subjective/ Last 24 hour Events:     Didn't sleep well last night.  Right scrotum is sore.   Educated on how to place scrotal sling.     Assessment:     Patient Active Problem List   Diagnosis    Type 2 diabetes mellitus with complication, with long-term current use of insulin    Heart attack    CAD (coronary artery disease)    Non-ischemic cardiomyopathy    Acute exacerbation of CHF (congestive heart failure)    PUD (peptic ulcer disease)    Acute dyspnea    CHF (congestive heart failure)    Headaches due to old head injury    Essential hypertension    Syncope and collapse    AICD (automatic cardioverter/defibrillator) present    Cardiomyopathy    SOB (shortness of breath)    GERD (gastroesophageal reflux disease)    Hypertensive heart disease without heart failure    Heart failure, unspecified    Nonischemic cardiomyopathy    Pure hypercholesterolemia    Chronic systolic congestive heart failure    Encounter for implantable defibrillator reprogramming or check    Episode of recurrent major depressive disorder, unspecified depression episode severity    Stage 3 chronic kidney disease, 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   -Restart low dose oral lasix.   - Con't ASA, Lipitor    Resp: Hx of COPD, currently stable.  - Supplemental O2 as needed for SpO2 >94%.  Currently on RA>  - Breo-Ellipta qAM    GI  Heart healthy diet. v    Infectious Disease (ID): leukocytosis, worsening. Hx of CLL.   - ID consulted, previously off zosyn for possible pyelonephritis  -Start Rocephin given patient with left lower extremity/ groin cellulitis.   -Made Dr. Janalyn Rouse aware.   Discussed with Dr. Abundio Miu, Heme/onc, given CLL leukocytosis in stress is common.       Hem/Onc: #Hx of CLL  - Oncology following, lymphadenopathy from CLL  -Lymphadenopathy resulting in vascular stasis of his scrotum.   Per Heme/onc CLL is stable.     -not a surgical candidate for lymphectomy- will make drainage worse.   -  WBC at baseline per patient's outpatient records  - VTE ppx: Lovenox inj & SCDs    Renal:  #Left Hydrocele and scrotal edema  - 5/24 CT A/P with moderate scrotal wall thickening and intraabdominal adenopathy suspected from CLL  - Scrotum edematous but not tender on palpation and no crepitus noted  - 5/19 Scrotal US with left hydrocele  5/26 Discussed with Trauma Surgery, Dr. Angelia Mould, recommended calling Urology for scrotal evaluation. Patient follows with OP urologist which only goes to [facility]. He recommended diuresis then reevaluation of hydrocele. Discussed with our on call urologist, Dr. Eligah East. Further recommendations pending. No signs of torsion    Add nystatin and topical for left sided superficial moisture associated dermatitis.   Elevated scrotum.   Reinforced with nursing and patient on proper care of edematous scrotum      Endo:  DM2  - f/u A1c  - Lantus + ISS      Patient has BMI=Body mass index is 28.58 kg/m.  Diagnosis: No additional diagnosis based on BMI criteria             Recent Labs     03/15/22  0138 03/14/22  0337 03/13/22  0228 03/12/22  1448   Sodium 132* 134* 135 136       Diagnosis: Mild Hyponatremia     GENERAL CRITICAL CARE ASSESSMENT/ PLANS- IF APPLICABLE    Vascular Access: PIV   Central line? If yes day#  GI Prophylaxis: N/A  VTE Prophylaxis: Lovenox Inj  Nutrition: Reg Diet  Sedation: none  Foley Catheter: none  AM Labs and Chest xray ordered: yes  Code Status:  full code  Disposition: Transfer to floor.       Hospital Course:   5/22: Admitted to Floor for volume overload. Diuresis with lasix 80mg  BID And GDMT  5/23: GDMT decreased  5/24: ID consulted for leukocytosis. Mild hypotension. GDMT held  5/25: Upgraded to [facility] for hypotension  5/27: elevated scrotum, left sided scrotal wound. Added nystatin and lidocaine topical.  5/28: Transfer to tele, continue cardiac management. Restart oral lasix. Mobility. Started rocephin for right groin cellulitis.     Medications:   Scheduled Meds:  Current Facility-Administered Medications   Medication Dose Route Frequency    aspirin EC  81 mg Oral Daily    atorvastatin  40 mg Oral QHS    enoxaparin  40 mg Subcutaneous Q24H SCH    fluticasone furoate-vilanterol  1 puff Inhalation QAM    insulin glargine  20 Units Subcutaneous QAM    insulin lispro  1-6 Units Subcutaneous QHS    insulin lispro  2-10 Units Subcutaneous TID AC    niacin  500 mg Oral BID    nystatin   Topical BID    PARoxetine  20 mg Oral QAM    polyethylene glycol  17 g Oral Daily    senna-docusate  1 tablet Oral QHS       Continuous Infusions:         Physical Exam:     Vitals:    03/15/22 1000   BP:    Pulse:    Resp:    Temp: 98.1 F (36.7 C)   SpO2:          Intake/Output Summary (Last 24 hours) at 03/15/2022 1030  Last data filed at 03/15/2022 1000  Gross per 24 hour   Intake 1500 ml   Output 1250 ml   Net  250 ml  Physical Exam  Vitals reviewed.   Constitutional:       General: He is not in acute distress.     Appearance: He is not toxic-appearing.   HENT:      Head: Normocephalic.      Mouth/Throat:      Mouth: Mucous membranes are moist.      Pharynx: Oropharynx is clear.   Eyes:      Extraocular Movements: Extraocular movements intact.      Pupils: Pupils are equal, round, and reactive to light.   Cardiovascular:      Rate and Rhythm: Normal rate and regular rhythm.      Pulses: Normal pulses.      Heart sounds: Normal heart sounds.   Pulmonary:      Effort: Pulmonary effort is normal.      Breath sounds: Normal breath sounds. No wheezing, rhonchi or rales.   Abdominal:      General: Abdomen is flat. Bowel sounds are normal.      Palpations: Abdomen is soft.   Genitourinary:     Penis: Erythema present.       Testes:         Left: Tenderness and testicular hydrocele present.      Tanner stage (genital): 5.          Comments: Edematous scrotum. Minimal TTP. No crepitus   No skin changes noted  Musculoskeletal:         General: Normal range of motion.      Cervical back: Normal range of motion.      Right lower leg: No edema.      Left lower leg: No edema.   Skin:     General: Skin is warm and dry.      Capillary Refill: Capillary refill takes 2 to 3 seconds.          Neurological:      General: No focal deficit present.      Mental Status: He is alert and oriented to person, place, and time.      Motor: No weakness.           Data:     Invasive Critical Care Hemodynamics:                   Vent Settings:         Lines/Drains/Airways:    [REMOVED] External Urinary Catheter (Removed)   Collection Container Suction canister (external cath only) 03/10/22 1626   Securement Method Securement device 03/10/22 1626   Site Assessment WDL 03/10/22 1626   Device Assessment Intact;No Air Leak;Connected to Suction 03/10/22 1626   Interventions Changed 03/10/22 1626   Urine Output (mL) 1200 mL 03/10/22 1626   Number of  days: 2       [REMOVED] External Urinary Catheter (Removed)   Urine Output (mL) 650 mL 03/11/22 0615   Number of days: 0       [REMOVED] External Urinary Catheter (Removed)   Collection Container Suction canister (external cath only) 03/13/22 0800   Securement Method Securement device 03/13/22 0800   Site Assessment WDL 03/13/22 0800   Device Assessment Other (Comment) 03/13/22 0200   Interventions Changed 03/12/22 1915   Urine Output (mL) 300 mL 03/12/22 2200   Number of days: 2                      Labs:     Labs (last 72 hours):  Recent Labs  03/15/22  0138 03/14/22  0424 03/13/22  0228   WBC 34.07* 30.56* 38.71*   Hgb 11.5* 11.9* 11.8*   Hematocrit 34.7* 35.5* 35.8*       No results for input(s): PT, INR, PTT in the last 72 hours. Recent Labs     03/15/22  0138 03/14/22  0337 03/13/22  0228   Sodium 132* 134* 135   Potassium 4.6 4.4 4.1   Chloride 101 100 101   CO2 20 22 21    BUN 58.0* 59.0* 51.0*   Creatinine 1.7* 1.9* 2.1*   Glucose 288* 206* 260*   Calcium 8.1 8.5 8.3   Magnesium 2.5 2.4 2.2   Phosphorus 3.0 3.6 3.4                 Rads:   Radiological Imaging personally reviewed, including:   No results found.      I have personally reviewed the patient's history and 24 hour interval events, along with vitals, labs, radiology images and nursing. So far today I have spent 50 minutes providing care for this patient excluding teaching and billable procedures, and not overlapping with any other providers.        Signed by:  Lavella Lemons, NP-C                       [facility]              03/15/22 10:30 AM             Medical Intensive Care Unit           Ext. [phone]          Intermediate Care Unit                       MD Ext: [phone]          NP Ext. [phone]           8p-8a provider Coverage Ex [phone]    Attending Attestation:   I have directly reviewed the clinical findings, labs, imaging studies and management of this patient in detail. I have interviewed and examined the patient and agree with the  documentation, assessment and plan of Kijuan Vernell Leep as recorded by Lavella Lemons, NP     Caffie Damme, MD

## 2022-03-15 NOTE — Progress Notes (Signed)
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 Edwyna Shell for moderate complex left hydrocele  D/w nurse     Subjective:   [name] [name] is a 76 y.o. male who presents to the hospital on 03/09/2022 with mild shortness of breath and persistent scrotal edema.  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 He was aggressively diuresed on the floor for volume overload. He was upgraded to the [facility] for new hypotension requiring Levophed, suspected from over-diuresis.      He saw his urologist Dr. Edwyna Shell at [facility] for scrotal edema.  He was advised to see his cardiologist.  Primary team discussed case with acute care surgery.  They do not perform inguinal lymphadenectomy and recommended urology consult.     WBC 34. Creatinine 1.7 from 1.9.  5/24 CT A/P with moderate scrotal wall thickening and intraabdominal adenopathy suspected from CLL. 5/19 Scrotal US showed moderate sized complex left hydrocele with septations, scrotal wall edema without fluid collection, bilateral testicular blood flow on Doppler, no mass, 5 mm right epididymal head cyst with left hydrocele.     Past Medical History:     Past Medical History:   Diagnosis Date    Acute CHF     NOV  & DEC 2018, - DIALYSIS CATH PLACED Aug 24 2017 TO REMOVE FLUID     Acute systolic (congestive) heart failure 08/2017    Anxiety     CAD (coronary artery disease)     Cardiomyopathy     nonischemic    CHF (congestive heart failure) 08/12/2014, 2013    Chronic obstructive pulmonary disease     POSSIBLE PER PT HE USES  SYNBICORT BID ABD  BREO  INHALER PRN    Coronary artery disease 2011    CORONARY STENT PLACEMENT FOLLOWED BY [facility]    Depression     echocardiogram 02/2016, 11/2016, 09/2017, 12/20/2017    GERD (gastroesophageal reflux disease)     Heart attack 2011    and 2014    Hyperlipemia     Hypertension     ICD (implantable 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     PLACED 2014  [facility]  LAST INTERROGATION April 01 2018 REPORT REQUESTED    Ischemic cardiomyopathy     EF 15% ON ECHO 09-20-2017 DR GARG IN EPIC    Nonischemic cardiomyopathy     NSTEMI (non-ST elevated myocardial infarction)     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    Pacemaker 2014    MEDTRONIC ICD/PACEMAKER COMBO LAST INTERROGATION  12-12 2018    Pneumonia 09/2016    Primary cardiomyopathy     Ischemic cardiomyopathy EF 15% ON ECHO 09-20-2017 DR GARG IN EPIC    Sepsis 08/2017    Syncope and collapse     PRIOR TO ICD/PACEMAKER PLACED 2014    Type 2 diabetes mellitus, controlled DX 1998    BS  AVG 100  A1C 7.4 Dec 27 2017    [place]     PT SEE HIS PMD April 01 2018 RE THIS-TOLD TO SEE PMD BY [facility] JUNE 12 WHEN THEY SAW HIM FOR A CL     Past Surgical History:     Past Surgical History:   Procedure Laterality Date    BIV  11/10/2016    ICD METRONIC  UPGRADE     CARDIAC CATHETERIZATION  03/2010    CORONARY STENT PLACEMENT X 2 PER PT; LM normal, LAD 70-80% distal lesion at apex, 20% lesion mid CFX    CARDIAC CATHETERIZATION  12/2012    CARDIAC DEFIBRILLATOR PLACEMENT  12/2012    [facility]    CARDIAC PACEMAKER PLACEMENT  2014    MEDTRONIC ICD/PACEMAKER PLACED [facility]    CIRCUMCISION  AGE 70    COLONOSCOPY  2009    CORRECTION HAMMER TOE      duodenal ulcer  1973    STRESS RELATED IN SCHOOL    ECHOCARDIOGRAM, TRANSTHORACIC  02/2016 11/2016 09/2017 12/2017    ECHOCARDIOGRAM, TRANSTHORACIC  02/2016,11/2016,12/20/2017    EGD N/A 08/15/2014    Procedure: EGD;  Surgeon: Colon Branch, MD;  Location: [facility];  Service: Gastroenterology;  Laterality: N/A;  egd w/ bx    EGD  1975    EGD, COLONOSCOPY N/A  04/04/2018    Procedure: EGD with bxs, COLONOSCOPY with polypectomy and clipping;  Surgeon: Doyne Keel, MD;  Location: [facility];  Service: Gastroenterology;  Laterality: N/A;    FRACTURE SURGERY  AGE 21     RT  ANKLE- CAST APPLIED    HERNIA REPAIR  AGE 70    LEFT INGUINAL HERNIA    MPI nuclear study  06/2016    orthopedic surgery  AGE 53    RT foot corrective - HAMMERTOE    TONSILLECTOMY AND ADENOIDECTOMY  1954     Family History:     Family History   Problem Relation Age of Onset    Breast cancer Mother     Heart attack Mother 16    Hypertension Mother     Diabetes Mother     Coronary artery disease Mother     Diabetes Brother     Heart attack Father     Hypertension Father      Social History:     Social History     Socioeconomic History    Marital status: Married     Spouse name: Teacher, music    Number of children: 0    Years of education: Not on file    Highest education level: Not on file   Occupational History    Occupation: Runner, 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, [place]    Tobacco Use    Smoking status: Never    Smokeless tobacco: Never   Vaping Use    Vaping status: Never Used   Substance and Sexual Activity    Alcohol use: No     Alcohol/week: 0.0 standard drinks of alcohol     Comment: occasionally    Drug use: No    Sexual activity: Yes     Partners: Female   Other Topics Concern    Not on file   Social History Narrative    Not on file     Social Determinants of Health     Financial Resource Strain: Not on file   Food Insecurity: Not on file   Transportation Needs: Not on file   Physical Activity: Not on file   Stress: Not on file   Social Connections: Not on file   Intimate Partner Violence:  Not on file   Housing Stability: Not on file     Allergies:   No Known Allergies  Medications:     Prior to Admission medications    Medication Sig Start Date End Date Taking? Authorizing Provider   aspirin EC 81 MG EC tablet Take 1 tablet (81 mg) by mouth daily   Yes [provider]   atorvastatin (LIPITOR) 40 MG  tablet Take 1 tablet (40 mg total) by mouth nightly 07/16/21  Yes Benton, Elliot Dally, MD   carvedilol (COREG) 25 MG tablet Take 1 tablet (25 mg total) by mouth 2 (two) times daily with meals 04/24/21  Yes Marianna Fuss, MD   empagliflozin (JARDIANCE) 10 MG tablet Take 1 tablet (10 mg total) by mouth daily 04/24/21  Yes Marianna Fuss, MD   fluticasone-salmeterol (ADVAIR DISKUS) 100-50 MCG/ACT Aerosol Pwdr, Breath Activated Inhale 1 puff into the lungs 2 (two) times daily 02/25/22   Zorita Pang, MD   glucose blood (ONE TOUCH ULTRA TEST) test strip 1 each by Other route 2 (two) times daily Use as instructed 04/18/21   Zorita Pang, MD   hydrocodone-chlorpheniramine (TUSSIONEX) 10-8 MG/5ML extended-release suspension Take 5 mLs by mouth 2 (two) times daily 11/24/21   Zorita Pang, MD   insulin glargine (LANTUS SOLOSTAR) 100 UNIT/ML injection pen Patient takes 50 units at night 04/18/21   Zorita Pang, MD   Insulin Pen Needle (BD Pen Needle Nano U/F) 32G X 4 MM Misc Use as directed bid 12/02/21   Zorita Pang, MD   metOLazone (ZAROXOLYN) 2.5 MG tablet Take 1 tablet (2.5 mg total) by mouth once a week 06/11/21 06/11/22  Marianna Fuss, MD   niacin (NIASPAN) 500 MG CR tablet TAKE 1 TABLET BY MOUTH TWICE DAILY 02/23/22   Zorita Pang, MD   PARoxetine (Paxil) 20 MG tablet Take 1 tablet (20 mg total) by mouth every morning 10/30/19   Zorita Pang, MD   sacubitril-valsartan Sherryll Burger) 97-103 MG Tab per tablet Take 1 tablet by mouth 2 (two) times daily 09/04/21   Conception Oms, NP   Semglee, yfgn, 100 UNIT/ML Solution Pen-injector ADMINISTER 50 UNITS UNDER THE SKIN AT BEDTIME 02/07/22   Zorita Pang, MD   SITagliptin-metFORMIN (Janumet) 50-500 MG tablet Take 1 tablet by mouth 2 (two) times daily with meals 01/08/22   Veligeti, Blinda Leatherwood, MD   spironolactone (ALDACTONE) 25 MG tablet Take 0.5 tablets (12.5 mg total) by mouth daily 04/24/21 04/24/22  Marianna Fuss, MD   torsemide (Demadex) 20 MG tablet Take 2 tablets (40 mg) by mouth  every morning AND 1 tablet (20 mg) every evening. 11/12/21   Marianna Fuss, MD      Review of Systems:   A comprehensive review of systems was: General ROS: negative  Psychological ROS: negative for depression, anxiety  Ophthalmic ROS: negative vision changes, eye discharge  ENT ROS: negative for headaches, nasal congestion or visual changes  Allergy and Immunology ROS: negative for nasal congestion or seasonal allergies  Hematological and Lymphatic ROS: negative for bleeding problems, blood clots, night sweats or weight loss  Endocrine ROS: negative for malaise/lethargy, palpitations or skin changes  Breast ROS: negative for new or changing breast lumps  Respiratory ROS: negative for cough, orthopnea, shortness of breath or tachypnea  Cardiovascular ROS: negative for chest pain, dyspnea on exertion or edema  Gastrointestinal ROS: negative for abdominal pain, blood in stools, change in bowel habits or constipation  Genito-Urinary ROS: negative for change in urinary stream,  genital discharge or nocturia  Musculoskeletal ROS: negative for gait disturbance, muscle pain or muscular weakness  Neurological ROS: negative for behavioral changes, gait disturbance, headaches, impaired coordination/balance or numbness/tingling  Dermatological ROS: negative for dry skin, hair changes, lumps and rash  Physical Exam:   Temp:  [97.5 F (36.4 C)-99.2 F (37.3 C)] 98.5 F (36.9 C)  Heart Rate:  [86-106] 99  Resp Rate:  [14-29] 22  BP: (79-121)/(50-94) 99/66  Intake and Output Summary (Last 24 hours) at Date Time    Intake/Output Summary (Last 24 hours) at 03/15/2022 1743  Last data filed at 03/15/2022 1718  Gross per 24 hour   Intake 1375 ml   Output 1300 ml   Net 75 ml     Body mass index is 28.58 kg/m.  General appearance - awake  Mental status - alert, oriented to person, place, and time  Neck - supple, no significant adenopathy  Lymphatics - no palpable lymphadenopathy  Abdomen - soft, nontender, nondistended, no masses or  organomegaly  Back exam - full range of motion, no tenderness, palpable spasm or pain on motion  Neurological - alert, oriented, normal speech, no focal findings or movement disorder noted  Musculoskeletal - no joint tenderness, deformity or swelling  Extremities - peripheral pulses normal, no pedal edema, no clubbing or cyanosis  Skin - normal coloration and turgor, no rashes, no suspicious skin lesions noted  GU shows scrotum swollen to the size of a small cantaloupe, no fluctuance/erythema/heat.  Penile edema.  Spermatic cord difficult to appreciate due to edema but within normal limits  Labs:     Results       Procedure Component Value Units Date/Time    Glucose Whole Blood - POCT [[id number]]  (Abnormal) Collected: 03/15/22 1624     Updated: 03/15/22 1643     Whole Blood Glucose POCT 226 mg/dL     Glucose Whole Blood - POCT [[id number]]  (Abnormal) Collected: 03/15/22 1159     Updated: 03/15/22 1219     Whole Blood Glucose POCT 246 mg/dL     Glucose Whole Blood - POCT [[id number]]  (Abnormal) Collected: 03/15/22 0726     Updated: 03/15/22 0731     Whole Blood Glucose POCT 282 mg/dL     Culture Blood Aerobic and Anaerobic [[id number]] Collected: 03/12/22 0058    Specimen: Blood, Venipuncture Updated: 03/15/22 2355    Narrative:      The order will result in two separate 8-41ml bottles  Please do NOT order repeat blood cultures if one has been  drawn within the last 48 hours  UNLESS concerned for  endocarditis  AVOID BLOOD CULTURE DRAWS FROM CENTRAL LINE IF POSSIBLE  Indications:->Other  Other->hypotension  ORDER#: [id number]                                    ORDERED BY: MANZELLA, SALVA  SOURCE: Blood, Venipuncture RH                       COLLECTED:  03/12/22 00:58  ANTIBIOTICS AT COLL.:                                RECEIVED :  03/12/22 05:54  Culture Blood Aerobic and Anaerobic        PRELIM      03/15/22 06:25  03/13/22  No Growth after 1 day/s of incubation.  03/14/22   No Growth after 2 day/s of  incubation.  03/15/22   No Growth after 3 day/s of incubation.      Culture Blood Aerobic and Anaerobic [[id number]] Collected: 03/12/22 0058    Specimen: Blood, Venipuncture Updated: 03/15/22 4098    Narrative:      The order will result in two separate 8-52ml bottles  Please do NOT order repeat blood cultures if one has been  drawn within the last 48 hours  UNLESS concerned for  endocarditis  AVOID BLOOD CULTURE DRAWS FROM CENTRAL LINE IF POSSIBLE  Indications:->Other  Other->hypotension  ORDER#: [id number]                                    ORDERED BY: MANZELLA, SALVA  SOURCE: Blood, Venipuncture Left Side                COLLECTED:  03/12/22 00:58  ANTIBIOTICS AT COLL.:                                RECEIVED :  03/12/22 05:54  Culture Blood Aerobic and Anaerobic        PRELIM      03/15/22 06:25  03/13/22   No Growth after 1 day/s of incubation.  03/14/22   No Growth after 2 day/s of incubation.  03/15/22   No Growth after 3 day/s of incubation.      CBC WITH MANUAL DIFFERENTIAL [[id number]]  (Abnormal) Collected: 03/15/22 0138     Updated: 03/15/22 0209     WBC 34.07 x10 3/uL      Hgb 11.5 g/dL      Hematocrit 78.4 %      Platelets 186 x10 3/uL      RBC 3.40 x10 6/uL      MCV 102.1 fL      MCH 33.8 pg      MCHC 33.1 g/dL      RDW 14 %      MPV 12.0 fL      Nucleated RBC 0.0 /100 WBC      Absolute NRBC 0.00 x10 3/uL      Segmented Neutrophils 10 %      Band Neutrophils 8 %      Lymphocytes Manual 77 %      Monocytes Manual 2 %      Eosinophils Manual 2 %      Basophils Manual 0 %      Atypical Lymphocytes % 1 %      Neutrophils Absolute Manual 3.41 x10 3/uL      Band Neutrophils Absolute 2.73 x10 3/uL      Lymphocytes Absolute Manual 26.23 x10 3/uL      Monocytes Absolute 0.68 x10 3/uL      Eosinophils Absolute Manual 0.68 x10 3/uL      Basophils Absolute Manual 0.00 x10 3/uL      Atypical Lymphocytes Absolute 0.34 x10 3/uL      Cell Morphology Abnormal     Platelet Estimate Normal     Macrocytic 2+     Smudge  Cells Present    Basic Metabolic Panel [[id number]]  (Abnormal) Collected: 03/15/22 0138    Specimen: Blood Updated: 03/15/22 0202     Glucose 288 mg/dL      BUN 13.2 mg/dL  Creatinine 1.7 mg/dL      Calcium 8.1 mg/dL      Sodium 161 mEq/L      Potassium 4.6 mEq/L      Chloride 101 mEq/L      CO2 20 mEq/L      Anion Gap 11.0     eGFR 41.2 mL/min/1.73 m2     Magnesium [[id number]] Collected: 03/15/22 0138    Specimen: Blood Updated: 03/15/22 0202     Magnesium 2.5 mg/dL     Phosphorus [[id number]] Collected: 03/15/22 0138    Specimen: Blood Updated: 03/15/22 0202     Phosphorus 3.0 mg/dL     Glucose Whole Blood - POCT [[id number]]  (Abnormal) Collected: 03/14/22 2059     Updated: 03/14/22 2102     Whole Blood Glucose POCT 235 mg/dL             Rads:     Radiology Results (24 Hour)       ** No results found for the last 24 hours. **            Signed by: Roxanne Mins, MD, MD    [facility]  [phone] [facility] office

## 2022-03-15 NOTE — Plan of Care (Signed)
Problem: Everyday - Heart Failure  Goal: Stable Vital Signs and Fluid Balance  Outcome: Progressing  Flowsheets (Taken 03/12/2022 1425 by Drexler, Trae, RN)  Stable Vital Signs and Fluid Balance:   Daily Standing Weights in the morning using the same scale, after using the bathroom and before breadfast.  If unable to stand, zero the bed and use the bed scale   Monitor, assess vital signs and telemetry per policy   Fluid Restriction   Assess for swelling/edema   Monitor labs and report abnormalities to physician  Goal: Mobility/Activity is Maintained at Optimal Level for Patient  Outcome: Progressing  Flowsheets (Taken 03/11/2022 1034 by Theodoro Doing, RN)  Mobility/Activity is Maintained at Optimal Level for Patient:   Increase mobility as tolerated/progressive mobility protocol   Reposition patient every 2 hours and as needed unless able to reposition self   Maintain SCD's as Ordered   Perform active/passive ROM   Assess for changes in respiratory status, level of consciousness and/or development of fatigue   Consult with Physical Therapy and/or Occupational Therapy  Goal: Nutritional Intake is Adequate  Outcome: Progressing  Flowsheets (Taken 03/11/2022 1034 by Theodoro Doing, RN)  Nutritional Intake is Adequate:   Cardiac diet-2 gm Sodium   Fluid Restricction if needed   Consult/Collaborate with Nutritionist   Assess appetite,anorexia and amount of meal/food tolerated   Encourage/perform oral hygiene as appropriate   Patient and family teaching on low sodium diet  Goal: Teaching-Using CHF Warning Zones and Educational Videos  Outcome: Progressing  Flowsheets (Taken 03/11/2022 1034 by Theodoro Doing, RN)  Teaching-Using CHF Warning Zones and Educational Videos:   Signs & Symptoms of CHF   Daily Standing Weights & record   CHF Warning Zones and when to call for help   Medications   Document in the Education Tab in EPIC with Teach-back   Fluid Restriction if appropriate   Sodium Restriction      Problem: Safety  Goal: Patient will be free from injury during hospitalization  Outcome: Progressing  Flowsheets (Taken 03/11/2022 1034 by Theodoro Doing, RN)  Patient will be free from injury during hospitalization:   Assess patient's risk for falls and implement fall prevention plan of care per policy   Use appropriate transfer methods   Ensure appropriate safety devices are available at the bedside   Provide and maintain safe environment   Hourly rounding   Include patient/ family/ care giver in decisions related to safety   Assess for patients risk for elopement and implement Elopement Risk Plan per policy  Goal: Patient will be free from infection during hospitalization  Outcome: Progressing  Flowsheets (Taken 03/11/2022 1034 by Theodoro Doing, RN)  Free from Infection during hospitalization:   Assess and monitor for signs and symptoms of infection   Monitor lab/diagnostic results   Monitor all insertion sites (i.e. indwelling lines, tubes, urinary catheters, and drains)   Encourage patient and family to use good hand hygiene technique     Problem: Moderate/High Fall Risk Score >5  Goal: Patient will remain free of falls  Outcome: Progressing  Flowsheets  Taken 03/15/2022 2154  VH High Risk (Greater than 13):   ALL REQUIRED LOW INTERVENTIONS   ALL REQUIRED MODERATE INTERVENTIONS  Taken 03/15/2022 2000  High (Greater than 13):   HIGH-Visual cue at entrance to patient's room   HIGH-Bed alarm on at all times while patient in bed   HIGH-Utilize chair pad alarm for patient while in the chair   HIGH-Apply  yellow "Fall Risk" arm band   HIGH-Pharmacy to initiate evaluation and intervention per protocol   HIGH-Consider use of low bed     Problem: Fluid and Electrolyte Imbalance/ Endocrine  Goal: Fluid and electrolyte balance are achieved/maintained  Outcome: Progressing  Flowsheets (Taken 03/15/2022 1310 by Stephanie CoupZhu, Jiajia, RN)  Fluid and electrolyte balance are achieved/maintained:   Monitor intake and output  every shift   Monitor/assess lab values and report abnormal values   Provide adequate hydration   Assess for confusion/personality changes   Monitor daily weight   Assess and reassess fluid and electrolyte status   Observe for seizure activity and initiate seizure precautions if indicated   Observe for cardiac arrhythmias   Monitor for muscle weakness  Goal: Adequate hydration  Outcome: Progressing  Flowsheets (Taken 03/15/2022 2154)  Adequate hydration:   Assess mucus membranes, skin color, turgor, perfusion and presence of edema   Assess for peripheral, sacral, periorbital and abdominal edema   Monitor and assess vital signs and perfusion     Problem: Diabetes: Glucose Imbalance  Goal: Blood glucose stable at established goal  Outcome: Progressing  Flowsheets (Taken 03/15/2022 2154)  Blood glucose stable at established goal:   Monitor lab values   Monitor intake and output.  Notify LIP if urine output is < 30 mL/hour.   Follow fluid restrictions/IV/PO parameters   Assess for hypoglycemia /hyperglycemia   Include patient/family in decisions related to nutrition/dietary selections   Monitor/assess vital signs   Coordinate medication administration with meals, as indicated   Ensure appropriate diet and assess tolerance   Ensure adequate hydration   Ensure appropriate consults are obtained (Nutrition, Diabetes Education, and Case Management)   Ensure patient/family has adequate teaching materials     Problem: Compromised Tissue integrity  Goal: Damaged tissue is healing and protected  Outcome: Progressing  Flowsheets (Taken 03/15/2022 1310 by Stephanie CoupZhu, Jiajia, RN)  Damaged tissue is healing and protected:   Increase activity as tolerated/progressive mobility   Keep intact skin clean and dry   Monitor external devices/tubes for correct placement to prevent pressure, friction and shearing   Encourage use of lotion/moisturizer on skin   Monitor patient's hygiene practices  Goal: Nutritional status is improving  Outcome:  Progressing  Flowsheets (Taken 03/15/2022 2154)  Nutritional status is improving:   Allow adequate time for meals   Encourage patient to take dietary supplement(s) as ordered   Collaborate with Clinical Nutritionist   Include patient/patient care companion in decisions related to nutrition

## 2022-03-15 NOTE — Progress Notes (Signed)
[place] [facility] PROGRESS NOTE    [facility]  [facility] INTERMEDIATE CARE UNIT  [address]  [place] [address]  Dept: [phone]  Dept Fax: [phone]  Loc: [phone]    Date Time: 03/15/22 4:51 PM  Patient Name: [facility] Day: 6    Subjective/Cardiac Relevant Events:   No acute events overnight.  Renal function continuing to improve.  Frequent PVCs/NSVT on monitor.    ASSESSMENT:   Patient is a 76 y.o. male with the following relevant diagnoses:    Recent admission for acute on chronic systolic CHF and scrotal edema  EF 25 to 30% by echo August 2022.  Moderate tricuspid and mitral regurgitation present.  PA pressures were 54 mmHg.   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  Would ideally resume GDMT (particularly in light of NSVT/PVCs), however BP remains low.    Continue to hold Entresto.  If BP improves, would trial metoprolol tartrate 12.5 once daily in place of carvedilol.  Then if tolerated, would transition to metoprolol succinate 12.5 mg once daily.    Telemetry/Labs Reviewed/Intake-Output:     Telemetry reviewed: NSR, occasional PVCs/NSVT    Recent Labs   Lab 03/11/22  2325 03/09/22  1740   hs Troponin-I 14.5 19.9   hs Troponin-I Delta  --  1.3               Recent Labs   Lab 03/12/22  0349 03/11/22  0611 03/10/22  0604   Bilirubin, Total 1.5*  More results in Results Review 1.1   Bilirubin Direct  --   --  0.4   Protein, Total 5.4*  More results in Results Review 5.3*   Albumin 2.8*  More results in Results Review 2.8*   ALT 9  More results in Results Review 9   AST (SGOT) 8  More results in Results Review 9   More results in Results Review = values in this interval not displayed.       Recent Labs   Lab  03/15/22  0138   Magnesium 2.5           Recent Labs   Lab 03/15/22  0138 03/14/22  0424 03/13/22  0228   WBC 34.07* 30.56* 38.71*   Hgb 11.5* 11.9* 11.8*   Hematocrit 34.7* 35.5* 35.8*   Platelets 186 166 175       Recent Labs   Lab 03/15/22  0138 03/14/22  0337 03/13/22  0228   Sodium 132* 134* 135   Potassium 4.6 4.4 4.1   Chloride 101 100 101   CO2 20 22 21    BUN 58.0* 59.0* 51.0*   Creatinine 1.7* 1.9* 2.1*   eGFR 41.2* 36.0* 31.9*   Glucose 288* 206* 260*   Calcium 8.1 8.5 8.3             Invalid input(s): FREET4    .  Lab Results   Component Value Date    BNP 3,115 (H) 03/11/2022            Intake/Output Summary (Last 24 hours) at 03/15/2022 1651  Last data filed at 03/15/2022 1600  Gross per 24 hour   Intake 1275 ml  Output 1300 ml   Net -25 ml         Medications:      Scheduled Meds: PRN Meds:    aspirin EC, 81 mg, Oral, Daily  atorvastatin, 40 mg, Oral, QHS  cefTRIAXone, 2 g, Intravenous, Q24H  enoxaparin, 40 mg, Subcutaneous, Q24H SCH  fluticasone furoate-vilanterol, 1 puff, Inhalation, QAM  furosemide, 20 mg, Oral, Daily  [START ON 03/16/2022] insulin glargine, 25 Units, Subcutaneous, QAM  insulin lispro, 1-6 Units, Subcutaneous, QHS  insulin lispro, 2-10 Units, Subcutaneous, TID AC  niacin, 500 mg, Oral, BID  nystatin, , Topical, BID  PARoxetine, 20 mg, Oral, QAM  polyethylene glycol, 17 g, Oral, Daily  senna-docusate, 1 tablet, Oral, QHS        Continuous Infusions:   acetaminophen, 650 mg, Q6H PRN  dextrose, 15 g of glucose, PRN   Or  dextrose, 12.5 g, PRN   Or  dextrose, 12.5 g, PRN   Or  glucagon (rDNA), 1 mg, PRN  lidocaine, , Q6H PRN  ondansetron, 4 mg, Q8H PRN          Physical Exam:     Vitals:    03/15/22 1430 03/15/22 1445 03/15/22 1500 03/15/22 1515   BP:   (!) 120/94    Pulse: 98 96 96 96   Resp: (!) 27 (!) 29 19 (!) 23   Temp:       TempSrc:       SpO2: 93% 92% 92% 91%   Weight:       Height:             03/09/2022    10:54 AM 03/09/2022    12:24 PM 03/09/2022     5:30 PM 03/10/2022     4:09 AM  03/11/2022     4:45 AM 03/12/2022    12:32 AM 03/13/2022     6:00 AM   Weight Monitoring   Height 182.9 cm 182.9 cm     182.9 cm   Height Method  Stated     Stated   Weight  99.338 kg 98.113 kg 97.614 kg 96.299 kg 95.7 kg 95.6 kg   Weight Method  Stated Standing Scale Standing Scale Standing Scale Bed Scale Bed Scale   BMI (calculated)  29.8 kg/m2     28.6 kg/m2       GENERAL: Patient is in no acute distress   HEENT: Moist mucous membranes   NECK: No jugular venous distention  CARDIAC: Regular rate and rhythm, with normal S1 and S2, and no murmurs,  CHEST: Clear to auscultation bilaterally, normal respiratory effort  ABDOMEN: Soft, nontender, non-distended, good bowel sounds   EXTREMITIES: No edema, +scrotal edema, 2+ radial pulses palpable   SKIN: No rash or jaundice   NEUROLOGIC: Alert and oriented to time, place and person, normal mood and affect    MUSCULOSKELETAL: Normal muscle strength and tone.    -------------------------------------------------------------------------------------  Signed by:         Verner Mould, MD         [facility]    [facility] Heart Contact Information   [facility]  Secure Chat (Group):   [facility] [place] [facility]    APP Spectralink:  [phone]    MD Spectralink :  [phone]  [phone]    After hours, non urgent consult line:  [phone]    After hours, physician on-call:  [phone] [facility]  Secure Chat (Group):   [place] [facility]    APP Spectralink:  [phone]  MD Spectralink :  [phone]      After hours, non urgent consult line:  [phone]    After hours, physician on-call:  [place] (Group):   [facility]    APP [place]:  [phone]    MD [place] :  [phone]      After hours, non urgent consult line:  [phone]    After hours, physician on-call:  [place] (Group):   [place] [facility]    APP [place]:  [phone]    MD [place] :  [phone]      After hours, non  urgent consult line:  [phone]    After hours, physician on-call:  [phone]       This note was generated by the Dragon speech recognition and may contain errors or omissions not intended by the user. Grammatical errors, random word insertions, deletions, pronoun errors, and incomplete sentences are occasional consequences of this technology due to software limitations. Not all errors are caught or corrected. If there are questions or concerns about the content of this note or information contained within the body of this dictation, they should be addressed directly with the author for clarification.

## 2022-03-15 NOTE — Plan of Care (Signed)
Pt is A&Ox3, confused about time. Pt is on RA, SPO2 95%. NSR from the monitor. Sitting in chair from 7am to 11:30pm. Right groin is red, tender and warm to touch, NP was made aware. IV Rocephin 2g given at 11am, pt threw up with some undigested food at 11:15am, BP 85/50, brought pt back to bed. NP was amde aware, IV zofran 4mg  given, IV NS bolus 250 cc given at 12:45pm. BP: 81/50-92/55. Scrotal swelling care: cleaned with wipes, dried with towel, nystatin cream and lidocaine solution applied, sling applied with a pillow case and scrotal supported by a towel.     Problem: Everyday - Heart Failure  Goal: Stable Vital Signs and Fluid Balance  Flowsheets (Taken 03/12/2022 1425 by Drexler, Trae, RN)  Stable Vital Signs and Fluid Balance:   Daily Standing Weights in the morning using the same scale, after using the bathroom and before breadfast.  If unable to stand, zero the bed and use the bed scale   Monitor, assess vital signs and telemetry per policy   Fluid Restriction   Assess for swelling/edema   Monitor labs and report abnormalities to physician     Problem: Safety  Goal: Patient will be free from injury during hospitalization  Flowsheets (Taken 03/11/2022 1034 by 03/13/2022, RN)  Patient will be free from injury during hospitalization:   Assess patient's risk for falls and implement fall prevention plan of care per policy   Use appropriate transfer methods   Ensure appropriate safety devices are available at the bedside   Provide and maintain safe environment   Hourly rounding   Include patient/ family/ care giver in decisions related to safety   Assess for patients risk for elopement and implement Elopement Risk Plan per policy     Problem: Fluid and Electrolyte Imbalance/ Endocrine  Goal: Fluid and electrolyte balance are achieved/maintained  Flowsheets (Taken 03/15/2022 1310)  Fluid and electrolyte balance are achieved/maintained:   Monitor intake and output every shift   Monitor/assess lab values and  report abnormal values   Provide adequate hydration   Assess for confusion/personality changes   Monitor daily weight   Assess and reassess fluid and electrolyte status   Observe for seizure activity and initiate seizure precautions if indicated   Observe for cardiac arrhythmias   Monitor for muscle weakness     Problem: Compromised Tissue integrity  Goal: Damaged tissue is healing and protected  Flowsheets (Taken 03/15/2022 1310)  Damaged tissue is healing and protected:   Increase activity as tolerated/progressive mobility   Keep intact skin clean and dry   Monitor external devices/tubes for correct placement to prevent pressure, friction and shearing   Encourage use of lotion/moisturizer on skin   Monitor patient's hygiene practices

## 2022-03-15 NOTE — Progress Notes (Signed)
Infectious Disease            Progress Note    03/15/2022   [name] [name] KHT:[id number]:[id number] is a 76 y.o. male, with history significant for diabetes mellitus, hypertension, chronic kidney disease, anxiety, depression, sleep apnea, COPD, hyperlipidemia, congestive heart failure, nonischemic cardiomyopathy, status post AICD placement, CAD with leukocytosis, lymphadenopathy.    Subjective:     [name] [name] today Symptoms: Afebrile, started on Rocephin for possible groin and lower extremity cellulitis. Other review of system is non contributory.    Objective:     Blood pressure 90/55, pulse 94, temperature 98.1 F (36.7 C), temperature source Oral, resp. rate 20, height 1.829 m (6'), weight 95.6 kg (210 lb 12.2 oz), SpO2 93 %.    General Appearance: Chronically sick looking, no acute distress  HEENT: Pallor negative, Anicteric sclera.    Neck:    Supple  Lungs: Decreased breath sound at bases.    Chest Wall: Symmetric chest wall expansion.   Heart : S1 and S2.   Abdomen: Abdomen is soft, bowel sounds positive.  Neurological: Alert and oriented to person, place and time.  Moves all extremities  Extremities: Lower extremity edema  Skin/scrotum: Scrotal swelling and tenderness  Psychiatric: Mood and affect is normal    Laboratory And Diagnostic Studies:     Recent Labs     03/15/22  0138 03/14/22  0424   WBC 34.07* 30.56*   Hgb 11.5* 11.9*   Hematocrit 34.7* 35.5*   Platelets 186 166   Segmented Neutrophils 10 18       Recent Labs     03/15/22  0138 03/14/22  0337   Sodium 132* 134*   Potassium 4.6 4.4   Chloride 101 100   CO2 20 22   BUN 58.0* 59.0*   Creatinine 1.7* 1.9*   Glucose 288* 206*   Calcium 8.1 8.5         Current Med's:     Current Facility-Administered Medications   Medication Dose Route Frequency    aspirin EC  81 mg Oral Daily    atorvastatin  40 mg Oral QHS    cefTRIAXone  2 g Intravenous Q24H    enoxaparin  40 mg Subcutaneous Q24H SCH    fluticasone furoate-vilanterol  1 puff  Inhalation QAM    furosemide  20 mg Oral Daily    insulin glargine  20 Units Subcutaneous QAM    insulin lispro  1-6 Units Subcutaneous QHS    insulin lispro  2-10 Units Subcutaneous TID AC    niacin  500 mg Oral BID    nystatin   Topical BID    PARoxetine  20 mg Oral QAM    polyethylene glycol  17 g Oral Daily    senna-docusate  1 tablet Oral QHS       Lines/Drains:     Patient Lines/Drains/Airways Status       Active Lines, Drains and Airways       Name Placement date Placement time Site Days    Peripheral IV 03/12/22 22 G Left;Posterior Forearm 03/12/22  0100  Forearm  3    Peripheral IV 03/14/22 20 G Anterior;Distal;Right Forearm 03/14/22  0400  Forearm  1                    Assessment:     Condition: Guarded  Lower extremity cellulitis  Retroperitoneal, inguinal, mesenteric lymphadenopathy  CLL  Congestive heart failure  Cardiomyopathy  S/p ICD placement  Diabetes  mellitus  Left hydrocele  Chronic kidney disease  Hypertension  Coronary artery disease  Chronic obstructive pulmonary disease  Hyperlipidemia  Sleep apnea  Depression    Plan:     Continue Rocephin  Hematology follow-up  Cardiology follow-up  Correction of electrolytes  Physical therapy  Discussed with treatment team          Alfonzo BeersSarfraz A Namiko Pritts, MD, M.D.,FACP  03/15/2022  12:02 PM          *This note was generated by the Epic EMR system/ Dragon speech recognition and may contain inherent errors or omissions not intended by the user. Grammatical errors, random word insertions, deletions, pronoun errors and incomplete sentences are occasional consequences of this technology due to software limitations. Not all errors are caught or corrected. If there are questions or concerns about the content of this note or information contained within the body of this dictation they should be addressed directly with the author for clarification

## 2022-03-16 LAB — BASIC METABOLIC PANEL
Anion Gap: 10 (ref 5.0–15.0)
BUN: 60 mg/dL — ABNORMAL HIGH (ref 9.0–28.0)
CO2: 21 mEq/L (ref 17–29)
Calcium: 8 mg/dL (ref 7.9–10.2)
Chloride: 100 mEq/L (ref 99–111)
Creatinine: 1.9 mg/dL — ABNORMAL HIGH (ref 0.5–1.5)
Glucose: 238 mg/dL — ABNORMAL HIGH (ref 70–100)
Potassium: 4.6 mEq/L (ref 3.5–5.3)
Sodium: 131 mEq/L — ABNORMAL LOW (ref 135–145)
eGFR: 36 mL/min/{1.73_m2} — AB (ref >=60)

## 2022-03-16 LAB — CBC AND DIFFERENTIAL
Absolute NRBC: 0 10*3/uL (ref 0.00–0.00)
Hematocrit: 33.1 % — ABNORMAL LOW (ref 37.6–49.6)
Hgb: 11 g/dL — ABNORMAL LOW (ref 12.5–17.1)
Instrument Absolute Neutrophil Count: 4.86 10*3/uL (ref 1.10–6.33)
MCH: 34.1 pg — ABNORMAL HIGH (ref 25.1–33.5)
MCHC: 33.2 g/dL (ref 31.5–35.8)
MCV: 102.5 fL — ABNORMAL HIGH (ref 78.0–96.0)
MPV: 12.1 fL (ref 8.9–12.5)
Nucleated RBC: 0 /100 WBC (ref 0.0–0.0)
Platelets: 202 10*3/uL (ref 142–346)
RBC: 3.23 10*6/uL — ABNORMAL LOW (ref 4.20–5.90)
RDW: 14 % (ref 11–15)
WBC: 37.78 10*3/uL — ABNORMAL HIGH (ref 3.10–9.50)

## 2022-03-16 LAB — MAN DIFF ONLY
Band Neutrophils Absolute: 0.76 10*3/uL (ref 0.00–1.00)
Band Neutrophils: 2 %
Basophils Absolute Manual: 0 10*3/uL (ref 0.00–0.08)
Basophils Manual: 0 %
Eosinophils Absolute Manual: 1.13 10*3/uL — ABNORMAL HIGH (ref 0.00–0.44)
Eosinophils Manual: 3 %
Lymphocytes Absolute Manual: 29.09 10*3/uL — ABNORMAL HIGH (ref 0.42–3.22)
Lymphocytes Manual: 77 %
Monocytes Absolute: 1.13 10*3/uL — ABNORMAL HIGH (ref 0.21–0.85)
Monocytes Manual: 3 %
Neutrophils Absolute Manual: 5.29 10*3/uL (ref 1.10–6.33)
Other Cells: 1 %
Segmented Neutrophils: 14 %

## 2022-03-16 LAB — GLUCOSE WHOLE BLOOD - POCT
Whole Blood Glucose POCT: 247 mg/dL — ABNORMAL HIGH (ref 70–100)
Whole Blood Glucose POCT: 242 mg/dL — ABNORMAL HIGH (ref 70–100)
Whole Blood Glucose POCT: 165 mg/dL — ABNORMAL HIGH (ref 70–100)
Whole Blood Glucose POCT: 206 mg/dL — ABNORMAL HIGH (ref 70–100)

## 2022-03-16 LAB — PHOSPHORUS: Phosphorus: 3.5 mg/dL (ref 2.3–4.7)

## 2022-03-16 LAB — CELL MORPHOLOGY
Cell Morphology: ABNORMAL — AB
Platelet Estimate: NORMAL

## 2022-03-16 LAB — MAGNESIUM: Magnesium: 2.4 mg/dL (ref 1.6–2.6)

## 2022-03-16 MED ORDER — AMOXICILLIN-POT CLAVULANATE 500-125 MG PO TABS
1.0000 | ORAL_TABLET | Freq: Two times a day (BID) | ORAL | Status: DC
Start: 2022-03-16 — End: 2022-03-20
  Administered 2022-03-16 – 2022-03-20 (×8): 1 via ORAL
  Filled 2022-03-16 (×8): qty 1

## 2022-03-16 MED ORDER — MELATONIN 3 MG PO TABS
3.0000 mg | ORAL_TABLET | Freq: Every evening | ORAL | Status: DC
Start: 2022-03-16 — End: 2022-03-20
  Administered 2022-03-16 – 2022-03-19 (×4): 3 mg via ORAL
  Filled 2022-03-16 (×4): qty 1

## 2022-03-16 MED ORDER — INSULIN GLARGINE 100 UNIT/ML SC SOLN
30.0000 [IU] | Freq: Every morning | SUBCUTANEOUS | Status: DC
Start: 2022-03-17 — End: 2022-03-17
  Administered 2022-03-17: 30 [IU] via SUBCUTANEOUS
  Filled 2022-03-16: qty 30

## 2022-03-16 MED ORDER — INSULIN GLARGINE 100 UNIT/ML SC SOLN
5.0000 [IU] | Freq: Once | SUBCUTANEOUS | Status: AC
Start: 2022-03-16 — End: 2022-03-16
  Administered 2022-03-16: 5 [IU] via SUBCUTANEOUS
  Filled 2022-03-16: qty 5

## 2022-03-16 MED ORDER — ACETAMINOPHEN 325 MG PO TABS
650.0000 mg | ORAL_TABLET | Freq: Four times a day (QID) | ORAL | Status: DC
Start: 2022-03-16 — End: 2022-03-20
  Administered 2022-03-16 – 2022-03-20 (×16): 650 mg via ORAL
  Filled 2022-03-16 (×16): qty 2

## 2022-03-16 NOTE — Progress Notes (Addendum)
Patient had reaction yesterday prior to receiving IV rocephin. MD and APP aware today, TW advised to give IV rocephin over 5 minutes. Will monitor closely for adverse reactions.     IV rocephin given slowly over 8 minutes. No complaints at this time. BP monitoring q15

## 2022-03-16 NOTE — UM Notes (Signed)
[facility] Utilization Review  [phone] [facility]  [place], [place] [address]  NPI # [id number]  Tax ID [id number]  Please call [phone] with any questions or concerns  Confidential Voice Mail  Fax final authorization and request for additional information to [phone]    PATIENT NAME: [name] [name], [name] [name]   DOB: 1946/10/05       Continued Stay review 5/29  Auth# [id number]    Reason for Continued Stay:  Pt remains on  IMCU unit + Tachycardia and Tachypnea + hypotension CHF  IV ABX + Scrotal edema    VS:98.8 HR 88-104 RR 19-30 BP 87/55 84/60 Sat 91-96%     Labs: Glucose 247 BC neg after 4 days WBC 37.78  H/H 11/33 Bandemia 2% BUN 60 Creat 1.9      MD Assessment and plan:    Plan:         Neuro: Hx of MDD and GAD  - Con't home Paxil     Cardio:  #HTN, HLD, CAD, ICM with HFrEF, ICD  - Appreciate Cardiology recommendations  - Continuing to hold coreg, although will need to restart given patient with 12 beasts NSVT overnight.   -Restart low dose oral lasix.   - Con't ASA, Lipitor     Resp: Hx of COPD, currently stable.  - Supplemental O2 as needed for SpO2 >94%.  Currently on RA>  - Breo-Ellipta qAM  Continues to be stable.     GI  Heart healthy diet.      Infectious Disease (ID): leukocytosis, stable.  Hx of CLL.   - ID consulted, previously off zosyn for possible pyelonephritis  -Continuing Rocephin given patient with left lower extremity/ groin cellulitis.   -Made Dr. Janalyn Rouse aware.   Yesterday he has a vagal response with Rocephin push. Advised to give slowly today, Monitor closely.         Hem/Onc: #Hx of CLL  - Oncology following, lymphadenopathy from CLL  -Lymphadenopathy resulting in vascular stasis of his scrotum.   Per Heme/onc CLL is stable.      -not a surgical candidate for lymphectomy- will make drainage worse.   - WBC at baseline per patient's outpatient records  - VTE ppx: Lovenox inj & SCDs     Renal:  #Left Hydrocele and scrotal edema  - 5/24 CT A/P with moderate scrotal wall thickening  and intraabdominal adenopathy suspected from CLL  - Scrotum edematous but not tender on palpation and no crepitus noted  - 5/19 Scrotal US with left hydrocele  5/26 Discussed with Trauma Surgery, Dr. Angelia Mould, recommended calling Urology for scrotal evaluation. Patient follows with OP urologist which only goes to [facility]. He recommended diuresis then reevaluation of hydrocele. Discussed with our on call urologist, Dr. Eligah East. Further recommendations pending. No signs of torsion     Add nystatin and topical for left sided superficial moisture associated dermatitis. This helps. Changed tylenol to scheduled as he is feeling pain which is limiting rest and mobility.      Elevated scrotum.   Reinforced with nursing and patient on proper care of edematous scrotum      Endo:  DM2  - f/u A1c  - Lantus + ISS    Cardiology COnsult:    ASSESSMENT:   Patient is a 76 y.o. male with the following relevant diagnoses:     Recent admission for acute on chronic systolic CHF and scrotal edema  EF 25 to 30% by echo August 2022.  Moderate tricuspid and mitral regurgitation  present.  PA pressures were 54 mmHg.   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  Would ideally resume GDMT (particularly in light of NSVT/PVCs), however BP remains low.    Continue to hold Entresto.  If BP improves, would trial metoprolol tartrate 12.5 once daily in place of carvedilol.  Then if tolerated, would transition to metoprolol succinate 12.5 mg once daily.    Urology Consult:  Assessment:   Moderate complex left hydrocele with septations  Scrotal edema secondary to CLL and CHF      Plan:   Scrotal elevation, scrotal support, mobilization  Scrotal edema secondary to CLL and CHF, no surgery indicated at this  time  Follow-up with outpatient urologist Dr. Edwyna ShellHart for moderate complex left hydrocele  D/w nurse     Orders:  Scheduled Meds:  Current Facility-Administered Medications   Medication Dose Route Frequency    acetaminophen  650 mg Oral Q6H    aspirin EC  81 mg Oral Daily    atorvastatin  40 mg Oral QHS    cefTRIAXone  2 g Intravenous Q24H    enoxaparin  40 mg Subcutaneous Q24H SCH    fluticasone furoate-vilanterol  1 puff Inhalation QAM    furosemide  20 mg Oral Daily    [START ON 03/17/2022] insulin glargine  30 Units Subcutaneous QAM    insulin lispro  1-6 Units Subcutaneous QHS    insulin lispro  2-10 Units Subcutaneous TID AC    melatonin  3 mg Oral QHS    niacin  500 mg Oral BID    nystatin   Topical BID    PARoxetine  20 mg Oral QAM    polyethylene glycol  17 g Oral Daily    senna-docusate  1 tablet Oral QHS       PRN Meds:.    Tylenol PO x1

## 2022-03-16 NOTE — Progress Notes (Signed)
[place] [facility] CARDIOLOGY PROGRESS NOTE    [name]  [name]  [address]  [place] [address]  Dept: [phone]  Dept Fax: [phone]  Loc: [phone]    Date Time: 03/16/22 9:21 AM  Patient Name: [name] [name] Day: 7    Subjective/Cardiac Relevant Events:   No acute events overnight.  Patient given 250 cc fluid bolus yesterday and initiated on ceftriaxone yesterday for persistent hypotension and possible groin cellulitis.  Ongoing frequent ventricular ectopy on monitor.    ASSESSMENT:   Patient is a 76 y.o. male with the following relevant diagnoses:    Recent admission for acute on chronic systolic CHF and scrotal edema  EF 25 to 30% by echo August 2022.  Moderate tricuspid and mitral regurgitation present.  PA pressures were 54 mmHg.   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  Diuretics held this morning for hypotension and patient was given 250 IVF bolus yesterday.  Will monitor for improvement in BP on antitbiotic therapy.  Would ideally resume GDMT (particularly in light of NSVT/PVCs), however BP remains low.    Continue to hold Entresto.  If BP improves, would trial metoprolol rather than carvedilol to minimize BP-lowering effect.    Telemetry/Labs Reviewed/Intake-Output:     Telemetry reviewed: NSR, occasional PVCs/NSVT    Recent Labs   Lab 03/11/22  2325 03/09/22  1740   hs Troponin-I 14.5 19.9   hs Troponin-I Delta  --  1.3               Recent Labs   Lab 03/12/22  0349 03/11/22  0611 03/10/22  0604   Bilirubin, Total 1.5*  More results in Results Review 1.1   Bilirubin Direct  --   --  0.4   Protein, Total 5.4*  More results in  Results Review 5.3*   Albumin 2.8*  More results in Results Review 2.8*   ALT 9  More results in Results Review 9   AST (SGOT) 8  More results in Results Review 9   More results in Results Review = values in this interval not displayed.       Recent Labs   Lab 03/16/22  0319   Magnesium 2.4           Recent Labs   Lab 03/16/22  0319 03/15/22  0138 03/14/22  0424   WBC 37.78* 34.07* 30.56*   Hgb 11.0* 11.5* 11.9*   Hematocrit 33.1* 34.7* 35.5*   Platelets 202 186 166       Recent Labs   Lab 03/16/22  0319 03/15/22  0138 03/14/22  0337   Sodium 131* 132* 134*   Potassium 4.6 4.6 4.4   Chloride 100 101 100   CO2 21 20 22    BUN 60.0* 58.0* 59.0*   Creatinine 1.9* 1.7* 1.9*   eGFR 36.0* 41.2* 36.0*   Glucose 238* 288* 206*   Calcium 8.0 8.1 8.5             Invalid input(s): FREET4    .  Lab Results   Component Value Date    BNP 3,115 (H) 03/11/2022  Intake/Output Summary (Last 24 hours) at 03/16/2022 0921  Last data filed at 03/16/2022 1610  Gross per 24 hour   Intake 1708 ml   Output 1350 ml   Net 358 ml         Medications:      Scheduled Meds: PRN Meds:    aspirin EC, 81 mg, Oral, Daily  atorvastatin, 40 mg, Oral, QHS  cefTRIAXone, 2 g, Intravenous, Q24H  enoxaparin, 40 mg, Subcutaneous, Q24H SCH  fluticasone furoate-vilanterol, 1 puff, Inhalation, QAM  furosemide, 20 mg, Oral, Daily  [START ON 03/17/2022] insulin glargine, 30 Units, Subcutaneous, QAM  insulin glargine, 5 Units, Subcutaneous, Once  insulin lispro, 1-6 Units, Subcutaneous, QHS  insulin lispro, 2-10 Units, Subcutaneous, TID AC  niacin, 500 mg, Oral, BID  nystatin, , Topical, BID  PARoxetine, 20 mg, Oral, QAM  polyethylene glycol, 17 g, Oral, Daily  senna-docusate, 1 tablet, Oral, QHS        Continuous Infusions:   acetaminophen, 650 mg, Q6H PRN  dextrose, 15 g of glucose, PRN   Or  dextrose, 12.5 g, PRN   Or  dextrose, 12.5 g, PRN   Or  glucagon (rDNA), 1 mg, PRN  lidocaine, , Q6H PRN  ondansetron, 4 mg, Q8H PRN          Physical Exam:      Vitals:    03/16/22 0700 03/16/22 0749 03/16/22 0800 03/16/22 0900   BP: 103/61  98/64 95/67   Pulse: 85 88 90 91   Resp: 22 (!) 23 (!) 24 (!) 30   Temp:       TempSrc:       SpO2: 96% 95% 93% 92%   Weight:       Height:             03/09/2022    12:24 PM 03/09/2022     5:30 PM 03/10/2022     4:09 AM 03/11/2022     4:45 AM 03/12/2022    12:32 AM 03/13/2022     6:00 AM 03/16/2022     6:00 AM   Weight Monitoring   Height 182.9 cm     182.9 cm    Height Method      Stated    Weight  98.113 kg 97.614 kg 96.299 kg 95.7 kg 95.6 kg 99.2 kg   Weight Method  Standing Scale Standing Scale Standing Scale Bed Scale Bed Scale Bed Scale   BMI (calculated)      28.6 kg/m2        GENERAL: Patient is in no acute distress   HEENT: Moist mucous membranes   NECK: No jugular venous distention  CARDIAC: Regular rate and rhythm, with normal S1 and S2, and no murmurs,  CHEST: Clear to auscultation bilaterally, normal respiratory effort  ABDOMEN: Soft, nontender, non-distended, good bowel sounds   EXTREMITIES: No edema, +scrotal edema, 2+ radial pulses palpable   SKIN: No rash or jaundice   NEUROLOGIC: Alert and oriented to time, place and person, normal mood and affect    MUSCULOSKELETAL: Normal muscle strength and tone.    -------------------------------------------------------------------------------------  Signed by:         Verner Mould, MD         [facility]    [facility] Contact Information   [facility]  Secure Chat (Group):   [facility] [place] [facility]    APP Spectralink:  [phone]    MD Spectralink :  [phone]  [phone]    After hours, non  urgent consult line:  [phone]    After hours, physician on-call:  [phone] [facility]  Secure Chat (Group):   [place]    APP Spectralink:  [phone]    MD Spectralink :  [phone]      After hours, non urgent consult line:  [phone]    After hours, physician on-call:  [phone] [facility]  Secure Chat (Group):   [facility] [place] [facility]    APP  Spectralink:  [phone]    MD Spectralink :  [phone]      After hours, non urgent consult line:  [phone]    After hours, physician on-call:  [phone] [facility]  Secure Chat (Group):   [place] [facility]    APP Spectralink:  [phone]    MD Spectralink :  [phone]      After hours, non urgent consult line:  [phone]    After hours, physician on-call:  [phone]       This note was generated by the Dragon speech recognition and may contain errors or omissions not intended by the user. Grammatical errors, random word insertions, deletions, pronoun errors, and incomplete sentences are occasional consequences of this technology due to software limitations. Not all errors are caught or corrected. If there are questions or concerns about the content of this note or information contained within the body of this dictation, they should be addressed directly with the author for clarification.

## 2022-03-16 NOTE — Progress Notes (Signed)
Infectious Disease            Progress Note    03/16/2022   [name] [name] Hadaway ZOX:[id number],MRN:[id number] is a 76 y.o. male, with history significant for diabetes mellitus, hypertension, chronic kidney disease, anxiety, depression, sleep apnea, COPD, hyperlipidemia, congestive heart failure, nonischemic cardiomyopathy, status post AICD placement, CAD with leukocytosis, lymphadenopathy.    Subjective:     [name] [name] Guest today Symptoms: Afebrile, improving swelling, erythema, looks more comfortable, no vomiting or diarrhea. Other review of system is non contributory.    Objective:     Blood pressure 95/67, pulse 91, temperature 98.2 F (36.8 C), temperature source Temporal, resp. rate (!) 30, height 1.829 m (6'), weight 99.2 kg (218 lb 11.1 oz), SpO2 92 %.    General Appearance: Looks comfortable, no acute distress  HEENT: Pallor negative, Anicteric sclera.    Neck:    Supple  Lungs: Decreased breath sound at bases.    Chest Wall: Symmetric chest wall expansion.   Heart : S1 and S2.   Abdomen: Abdomen is soft, bowel sounds positive.  Neurological: Alert and oriented to person, place and time.  Moves all extremities  Extremities: Lower extremity edema  Skin/scrotum: Scrotal swelling and tenderness  Psychiatric: Mood and affect is normal    Laboratory And Diagnostic Studies:     Recent Labs     03/16/22  0319 03/15/22  0138   WBC 37.78* 34.07*   Hgb 11.0* 11.5*   Hematocrit 33.1* 34.7*   Platelets 202 186   Segmented Neutrophils 14 10       Recent Labs     03/16/22  0319 03/15/22  0138   Sodium 131* 132*   Potassium 4.6 4.6   Chloride 100 101   CO2 21 20   BUN 60.0* 58.0*   Creatinine 1.9* 1.7*   Glucose 238* 288*   Calcium 8.0 8.1         Current Med's:     Current Facility-Administered Medications   Medication Dose Route Frequency    acetaminophen  650 mg Oral Q6H    amoxicillin-clavulanate  1 tablet [facility]    aspirin EC  81 mg Oral Daily    atorvastatin  40 mg Oral QHS    enoxaparin  40 mg Subcutaneous  Q24H SCH    fluticasone furoate-vilanterol  1 puff Inhalation QAM    furosemide  20 mg Oral Daily    [START ON 03/17/2022] insulin glargine  30 Units Subcutaneous QAM    insulin lispro  1-6 Units Subcutaneous QHS    insulin lispro  2-10 Units Subcutaneous TID AC    melatonin  3 mg Oral QHS    niacin  500 mg Oral BID    nystatin   Topical BID    PARoxetine  20 mg Oral QAM    polyethylene glycol  17 g Oral Daily    senna-docusate  1 tablet Oral QHS       Lines/Drains:     Patient Lines/Drains/Airways Status       Active Lines, Drains and Airways       Name Placement date Placement time Site Days    Peripheral IV 03/12/22 22 G Left;Posterior Forearm 03/12/22  0100  Forearm  4    Peripheral IV 03/14/22 20 G Anterior;Distal;Right Forearm 03/14/22  0400  Forearm  2                    Assessment:     Condition: Guarded  Lower extremity cellulitis  Retroperitoneal, inguinal, mesenteric lymphadenopathy  CLL  Congestive heart failure  Cardiomyopathy  S/p ICD placement  Diabetes mellitus  Left hydrocele  Chronic kidney disease  Hypertension  Coronary artery disease  Chronic obstructive pulmonary disease  Hyperlipidemia  Sleep apnea  Depression    Plan:     Start Augmentin  Discontinue Rocephin  Hematology follow-up  Cardiology follow-up  Correction of electrolytes  Physical therapy  Urology follow-up  Discussed with treatment team          Alfonzo Beers, MD, M.D.,FACP  03/16/2022  9:20 AM          *This note was generated by the Epic EMR system/ Dragon speech recognition and may contain inherent errors or omissions not intended by the user. Grammatical errors, random word insertions, deletions, pronoun errors and incomplete sentences are occasional consequences of this technology due to software limitations. Not all errors are caught or corrected. If there are questions or concerns about the content of this note or information contained within the body of this dictation they should be addressed directly with the author for  clarification

## 2022-03-16 NOTE — Progress Notes (Signed)
CRITICAL CARE  [facility]    [facility]- Intermediate Care Note    Critical Care Daily Progress Note      Date Time: 03/16/22 10:04 AM  Patient Name: [name] [name]  Attending Physician: Caffie Damme, MD  Room: [id number]   Admit Date: 03/09/2022  LOS: 7 days      HPI:   53M with a PMH of GAD, MDD, CLL, HTN, HLD, CAD, NICM with HFrEF (25-30%), ICD, GERD, DM2, who presented with mild SOB and persistent scrotal edema. He was aggressively diuresed on the floor for volume overload. He was upgraded to the [facility] for new hypotension requiring Levophed, suspected from over-diuresis.     Subjective/ Last 24 hour Events:   Restart diuretics  NSVT on telemetry, resume BB~> Metoprolol for less blood pressure effect   Worsening white blood cell count from baseline, will repeat CBC with diff  Started on antibiotics for cellulitis of the right groin and right scrotum   Assessment:     Patient Active Problem List   Diagnosis    Type 2 diabetes mellitus with complication, with long-term current use of insulin    Heart attack    CAD (coronary artery disease)    Non-ischemic cardiomyopathy    Acute exacerbation of CHF (congestive heart failure)    PUD (peptic ulcer disease)    Acute dyspnea    CHF (congestive heart failure)    Headaches due to old head injury    Essential hypertension    Syncope and collapse    AICD (automatic cardioverter/defibrillator) present    Cardiomyopathy    SOB (shortness of breath)    GERD (gastroesophageal reflux disease)    Hypertensive heart disease without heart failure    Heart failure, unspecified    Nonischemic cardiomyopathy    Pure hypercholesterolemia    Chronic systolic congestive heart failure    Encounter for implantable defibrillator reprogramming or check    Episode of recurrent major depressive disorder, unspecified depression episode severity    Stage 3 chronic kidney disease, 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    Anxiety and depression    COPD (chronic  obstructive pulmonary disease)    HLD (hyperlipidemia)    Sleep apnea, obstructive    Scrotal edema    Hydrocele, unspecified hydrocele type    Acute on chronic congestive heart failure, unspecified heart failure type     Plan:   Neuro:   # Hx of MDD and GAD  - Con't home Paxil    # Pain related to scrotal edema and hydrocele   - Acetaminophen 650mg  Q6     Cardiac:  # Acute on chronic systolic heart failure; improving   # NICM (EF 15% May 2021,  25 to 30% August 2022), No CAD on LHC 2014  # Moderate MR/TR  # Medtronic ICD in place  # NSVT on telemetry   - Appreciate Cardiology recommendations  - Restart Torsemide 40 mg a.m. and 20 mg p.m  - Start Metoprolol Tartrate 12.5mg  BID, if tolerant then transition to Toprol XL 03/18/22  - Currently on ASA, Lipitor    Resp:   # Hx of COPD, currently stable  - Supplemental O2 as needed for SpO2 >94%.  - Currently on RA>  - Breo-Ellipta qAM    GI  # Constipation, last BM was 5/26  - Miralax 17g qd and Peri-colace 1 tab QHS  - Heart healthy/Consistent carbohydrate Diet     Infectious Disease (ID):   #  Hx of CLL  # Right groin/scrotum cellulitis   # Leukocytosis, above baseline  - Dr.Choudhary following for cellulitis  - Reportedly with vagal response with Rocephin push  - Rocephin transitioned to Augmentin   - Check CBC with diff in the morning   - Miconazole 2% with zinc   - Per Heme/onc CLL is stable    Renal  # Left Hydrocele and scrotal edema  # Lymphadenopathy resulting in vascular stasis of his scrotum  - 5/19 Scrotal US with left hydrocele  - 5/24 CT A/P moderate scrotal wall thickening, intraabdominal adenopathy suspected CLL  - Patient follows with OP urologist which only goes to [employer], will need reevaluation of hydrocele   - Not a surgical candidate for lymphectomy- will make drainage worse  - Per Urology~Scrotal elevation, scrotal support, mobilization  - Miconazole 2% with zinc     # Stable Hyponatremia  - Monitor sodium levels daily     Endo:    # IDDM with  hyperglycemia (HgA1c 5.4% on 5/26)  - Has required escalating doses of Lantus over the last few day  - Received 30 units on 5/29 plus SSI, Glucose trends 202-261  - Increase lantus to 40 units qd (Home dose is Lantus 50 units qd)      Recent Labs     03/16/22  0319 03/15/22  0138 03/14/22  0337   Sodium 131* 132* 134*       Diagnosis: Mild Hyponatremia     GENERAL CRITICAL CARE ASSESSMENT/ PLANS- IF APPLICABLE    Vascular Access: PIV   Central line? If yes day#  GI Prophylaxis: N/A  VTE Prophylaxis: Lovenox Inj  Nutrition: Reg Diet  Sedation: none  Foley Catheter: none  AM Labs and Chest xray ordered: yes  Code Status:  full code  Disposition: Transfer to floor.   Hospital Course:   5/22: Admitted to Floor for volume overload. Diuresis with lasix 80mg  BID And GDMT  5/23: GDMT decreased  5/24: ID consulted for leukocytosis. Mild hypotension. GDMT held  5/25: Upgraded to [facility] for hypotension  5/27: elevated scrotum, left sided scrotal wound. Added nystatin and lidocaine topical.  5/28: Restart oral lasix. Mobility. Started rocephin for right groin cellulitis  5/29:Educated on mobility, delirium prevention  5/30: Initiating heart failure therapy, Now on antibiotics for right groin cellulitis   Medications:   Scheduled Meds:  Current Facility-Administered Medications   Medication Dose Route Frequency    aspirin EC  81 mg Oral Daily    atorvastatin  40 mg Oral QHS    cefTRIAXone  2 g Intravenous Q24H    enoxaparin  40 mg Subcutaneous Q24H SCH    fluticasone furoate-vilanterol  1 puff Inhalation QAM    furosemide  20 mg Oral Daily    [START ON 03/17/2022] insulin glargine  30 Units Subcutaneous QAM    insulin glargine  5 Units Subcutaneous Once    insulin lispro  1-6 Units Subcutaneous QHS    insulin lispro  2-10 Units Subcutaneous TID AC    niacin  500 mg Oral BID    nystatin   Topical BID    PARoxetine  20 mg Oral QAM    polyethylene glycol  17 g Oral Daily    senna-docusate  1 tablet Oral QHS       Continuous  Infusions:         Physical Exam:     Vitals:    03/16/22 0931   BP:    Pulse:  Resp:    Temp: 98.2 F (36.8 C)   SpO2:          Intake/Output Summary (Last 24 hours) at 03/16/2022 1004  Last data filed at 03/16/2022 0931  Gross per 24 hour   Intake 1228 ml   Output 1200 ml   Net 28 ml         Physical Exam  Vitals reviewed.   Constitutional:       General: He is not in acute distress.     Appearance: He is not toxic-appearing.   HENT:      Head: Normocephalic.      Mouth/Throat:      Mouth: Mucous membranes are moist.      Pharynx: Oropharynx is clear.   Eyes:      Extraocular Movements: Extraocular movements intact.      Pupils: Pupils are equal, round, and reactive to light.   Cardiovascular:      Rate and Rhythm: Normal rate and regular rhythm.      Pulses: Normal pulses.      Heart sounds: Normal heart sounds.   Pulmonary:      Effort: Pulmonary effort is normal.      Breath sounds: Normal breath sounds. No wheezing, rhonchi or rales.   Abdominal:      General: Abdomen is flat. Bowel sounds are normal.      Palpations: Abdomen is soft.   Genitourinary:     Penis: Erythema present.       Testes:         Left: Tenderness and testicular hydrocele present.      Tanner stage (genital): 5.          Comments: Edematous scrotum. Minimal TTP. No crepitus   No skin changes noted  Musculoskeletal:         General: Normal range of motion.      Cervical back: Normal range of motion.      Right lower leg: No edema.      Left lower leg: No edema.   Skin:     General: Skin is warm and dry.      Capillary Refill: Capillary refill takes 2 to 3 seconds.          Neurological:      General: No focal deficit present.      Mental Status: He is alert and oriented to person, place, and time.      Motor: No weakness.           Data:     Invasive Critical Care Hemodynamics:                   Vent Settings:         Lines/Drains/Airways:    [REMOVED] External Urinary Catheter (Removed)   Collection Container Suction canister (external  cath only) 03/10/22 1626   Securement Method Securement device 03/10/22 1626   Site Assessment WDL 03/10/22 1626   Device Assessment Intact;No Air Leak;Connected to Suction 03/10/22 1626   Interventions Changed 03/10/22 1626   Urine Output (mL) 1200 mL 03/10/22 1626   Number of days: 2       [REMOVED] External Urinary Catheter (Removed)   Urine Output (mL) 650 mL 03/11/22 0615   Number of days: 0       [REMOVED] External Urinary Catheter (Removed)   Collection Container Suction canister (external cath only) 03/13/22 0800   Securement Method Securement device 03/13/22 0800   Site Assessment WDL 03/13/22  0800   Device Assessment Other (Comment) 03/13/22 0200   Interventions Changed 03/12/22 1915   Urine Output (mL) 300 mL 03/12/22 2200   Number of days: 2                      Labs:     Labs (last 72 hours):  Recent Labs     03/16/22  0319 03/15/22  0138 03/14/22  0424   WBC 37.78* 34.07* 30.56*   Hgb 11.0* 11.5* 11.9*   Hematocrit 33.1* 34.7* 35.5*       No results for input(s): PT, INR, PTT in the last 72 hours. Recent Labs     03/16/22  0319 03/15/22  0138 03/14/22  0337   Sodium 131* 132* 134*   Potassium 4.6 4.6 4.4   Chloride 100 101 100   CO2 21 20 22    BUN 60.0* 58.0* 59.0*   Creatinine 1.9* 1.7* 1.9*   Glucose 238* 288* 206*   Calcium 8.0 8.1 8.5   Magnesium 2.4 2.5 2.4   Phosphorus 3.5 3.0 3.6                 Rads:   Radiological Imaging personally reviewed, including:   No results found.      I have personally reviewed the patient's history and 24 hour interval events, along with vitals, labs, radiology images and nursing. So far today I have spent 49 minutes providing care for this patient excluding teaching and billable procedures, and not overlapping with any other providers.        Signed by:  Lavella Lemons, NP-C                       [facility]              03/16/22 10:04 AM             Medical Intensive Care Unit           Ext. [phone]          Intermediate Care Unit                        MD Ext: [phone]          NP Ext. [phone]           8p-8a provider Coverage Ex [phone]    Attending Attestation:   I have directly reviewed the clinical findings, labs, imaging studies and management of this patient in detail. I have interviewed and examined the patient and agree with the documentation, assessment and plan of [name] [name] as recorded by Lavella Lemons, NP     Caffie Damme, MD

## 2022-03-16 NOTE — Plan of Care (Signed)
Problem: Everyday - Heart Failure  Goal: Stable Vital Signs and Fluid Balance  Outcome: Progressing  Flowsheets (Taken 03/12/2022 1425)  Stable Vital Signs and Fluid Balance:   Daily Standing Weights in the morning using the same scale, after using the bathroom and before breadfast.  If unable to stand, zero the bed and use the bed scale   Monitor, assess vital signs and telemetry per policy   Fluid Restriction   Assess for swelling/edema   Monitor labs and report abnormalities to physician  Goal: Mobility/Activity is Maintained at Optimal Level for Patient  Outcome: Progressing  Flowsheets (Taken 03/11/2022 1034 by Theodoro Doing, RN)  Mobility/Activity is Maintained at Optimal Level for Patient:   Increase mobility as tolerated/progressive mobility protocol   Reposition patient every 2 hours and as needed unless able to reposition self   Maintain SCD's as Ordered   Perform active/passive ROM   Assess for changes in respiratory status, level of consciousness and/or development of fatigue   Consult with Physical Therapy and/or Occupational Therapy     Problem: Fluid and Electrolyte Imbalance/ Endocrine  Goal: Fluid and electrolyte balance are achieved/maintained  Outcome: Progressing  Flowsheets (Taken 03/15/2022 1310 by Stephanie Coup, RN)  Fluid and electrolyte balance are achieved/maintained:   Monitor intake and output every shift   Monitor/assess lab values and report abnormal values   Provide adequate hydration   Assess for confusion/personality changes   Monitor daily weight   Assess and reassess fluid and electrolyte status   Observe for seizure activity and initiate seizure precautions if indicated   Observe for cardiac arrhythmias   Monitor for muscle weakness

## 2022-03-17 ENCOUNTER — Inpatient Hospital Stay: Payer: Commercial Managed Care - POS

## 2022-03-17 DIAGNOSIS — N5089 Other specified disorders of the male genital organs: Secondary | ICD-10-CM

## 2022-03-17 LAB — GLUCOSE WHOLE BLOOD - POCT
Whole Blood Glucose POCT: 186 mg/dL — ABNORMAL HIGH (ref 70–100)
Whole Blood Glucose POCT: 230 mg/dL — ABNORMAL HIGH (ref 70–100)
Whole Blood Glucose POCT: 193 mg/dL — ABNORMAL HIGH (ref 70–100)
Whole Blood Glucose POCT: 172 mg/dL — ABNORMAL HIGH (ref 70–100)

## 2022-03-17 LAB — CBC
Absolute NRBC: 0 10*3/uL (ref 0.00–0.00)
Hematocrit: 31.8 % — ABNORMAL LOW (ref 37.6–49.6)
Hgb: 10.2 g/dL — ABNORMAL LOW (ref 12.5–17.1)
MCH: 33.3 pg (ref 25.1–33.5)
MCHC: 32.1 g/dL (ref 31.5–35.8)
MCV: 103.9 fL — ABNORMAL HIGH (ref 78.0–96.0)
MPV: 11.6 fL (ref 8.9–12.5)
Nucleated RBC: 0 /100 WBC (ref 0.0–0.0)
Platelets: 197 10*3/uL (ref 142–346)
RBC: 3.06 10*6/uL — ABNORMAL LOW (ref 4.20–5.90)
RDW: 14 % (ref 11–15)
WBC: 43.1 10*3/uL — ABNORMAL HIGH (ref 3.10–9.50)

## 2022-03-17 LAB — BASIC METABOLIC PANEL
Anion Gap: 10 (ref 5.0–15.0)
BUN: 58 mg/dL — ABNORMAL HIGH (ref 9.0–28.0)
CO2: 20 mEq/L (ref 17–29)
Calcium: 8.1 mg/dL (ref 7.9–10.2)
Chloride: 100 mEq/L (ref 99–111)
Creatinine: 1.6 mg/dL — ABNORMAL HIGH (ref 0.5–1.5)
Glucose: 202 mg/dL — ABNORMAL HIGH (ref 70–100)
Potassium: 4.6 mEq/L (ref 3.5–5.3)
Sodium: 130 mEq/L — ABNORMAL LOW (ref 135–145)
eGFR: 44.4 mL/min/{1.73_m2} — AB (ref >=60)

## 2022-03-17 LAB — PHOSPHORUS: Phosphorus: 3.4 mg/dL (ref 2.3–4.7)

## 2022-03-17 LAB — MAGNESIUM: Magnesium: 2.5 mg/dL (ref 1.6–2.6)

## 2022-03-17 MED ORDER — METOPROLOL TARTRATE 25 MG PO TABS
12.5000 mg | ORAL_TABLET | Freq: Two times a day (BID) | ORAL | Status: DC
Start: 2022-03-17 — End: 2022-03-18
  Administered 2022-03-17: 12.5 mg via ORAL
  Filled 2022-03-17 (×3): qty 1

## 2022-03-17 MED ORDER — TORSEMIDE 20 MG PO TABS
20.0000 mg | ORAL_TABLET | ORAL | Status: DC
Start: 2022-03-17 — End: 2022-03-20
  Administered 2022-03-17 – 2022-03-20 (×4): 20 mg via ORAL
  Filled 2022-03-17 (×4): qty 1

## 2022-03-17 MED ORDER — MICONAZOLE NITRATE 2 % EX CREAM WITH ZINC OXIDE
TOPICAL_CREAM | Freq: Two times a day (BID) | CUTANEOUS | Status: DC
Start: 2022-03-17 — End: 2022-03-20
  Filled 2022-03-17: qty 142

## 2022-03-17 MED ORDER — INSULIN GLARGINE 100 UNIT/ML SC SOLN
10.0000 [IU] | Freq: Once | SUBCUTANEOUS | Status: AC
Start: 2022-03-17 — End: 2022-03-17
  Administered 2022-03-17: 10 [IU] via SUBCUTANEOUS
  Filled 2022-03-17: qty 10

## 2022-03-17 MED ORDER — INSULIN GLARGINE 100 UNIT/ML SC SOLN
40.0000 [IU] | Freq: Every morning | SUBCUTANEOUS | Status: DC
Start: 2022-03-18 — End: 2022-03-20
  Administered 2022-03-18 – 2022-03-20 (×3): 40 [IU] via SUBCUTANEOUS
  Filled 2022-03-17 (×3): qty 40

## 2022-03-17 MED ORDER — ENOXAPARIN SODIUM 100 MG/ML IJ SOSY
1.0000 mg/kg | PREFILLED_SYRINGE | Freq: Two times a day (BID) | INTRAMUSCULAR | Status: DC
Start: 2022-03-17 — End: 2022-03-19
  Administered 2022-03-17 – 2022-03-18 (×3): 100 mg via SUBCUTANEOUS
  Filled 2022-03-17 (×3): qty 1

## 2022-03-17 MED ORDER — TORSEMIDE 20 MG PO TABS
40.0000 mg | ORAL_TABLET | Freq: Every day | ORAL | Status: DC
Start: 2022-03-18 — End: 2022-03-20
  Administered 2022-03-18 – 2022-03-20 (×3): 40 mg via ORAL
  Filled 2022-03-17 (×3): qty 2

## 2022-03-17 NOTE — Progress Notes (Signed)
Case Management Progress Note   [facility]  [facility] INTERMEDIATE CARE UNIT     Patient Name: [name] [name], [name] [name]   Date of Birth [birth year]   Attending Physician: Allegra Grana, MD   Primary Care Physician: Zorita Pang, MD   Length of Stay 8   Reason for Consult / Chief Complaint Shortness of Breath, Groin Swelling, and Leg Swelling        Hospital Problem List:   Principal Problem:    Acute on chronic congestive heart failure, unspecified heart failure type  Active Problems:    Scrotal edema    Hydrocele, unspecified hydrocele type        Assessment and Recommendations:                                                              Assessment: Patient here with CHF and scrotal swelling that has decreased. Patient is able to be discharged in the next 24 hours.     Recommendations: Home     Discharge Plan and Potential Barriers:                                                              Discharge Plan: Home    Potential Barriers:None at this time     CM Interventions:                                                              CM spoke with patient to discuss discharge planning and needs.    CM discussed the above discharge plan with Renee Ramus, Attending MD, and Bedside RN.  All agreed with plan, verbalized understanding and all questions were answered.    CM will continue to follow for discharge planning and needs.               Adria Dill, BSN, RN  RN Case Manager I  [facility]    P: [phone]   F: [phone]  [email or web address]

## 2022-03-17 NOTE — OT Eval Note (Signed)
Occupational Therapy Evaluation  Patient: [name] [name]    O962/X528-U  Discharge Recommendations:   Based on today's session: Acute Rehab     If Acute Rehab is not available, then the patient will need home health services, increase supervision , assistance with mobility, assistance with ADL's, and assistance with IADL's  If pt returns home, DME Recommended for Discharge: Front wheel walker    Unit: [name] INTERMEDIATE CARE UNIT  Bed: X324/M010-U      Assessment:   [name] [name] is a 76 y.o. male admitted 03/09/2022.   Brief chart review completed including review of labs, review of imaging, review of H&P and physician progress notes, and review of consulting physician notes .  Pt's ability to complete ADLs and functional transfers is impaired due to the following deficits:  decreased activity tolerance, decreased balance, and decreased strength.  Pt demonstrates performance deficits with grooming, bathing, dressing, toileting, and functional mobility. There are a few comorbidities or other factors that affect plan of care and require modification of task including: PMH.  Pt would continue to benefit from OT to address these deficits and increase functional independence.    Assessment: decreased strength;balance deficits;decreased independence with ADLs;decreased independence with IADLs;decreased endurance/activity tolerance     Complexity Chart Review Performance Deficits Clinical Decision Making Hx/Comorbidities Assistance needed   Low Brief 1-3 Limited options None None (or at baseline)   Moderate Expanded 3-5 Several Options 1-2 Min/Mod assist (not at baseline)   High Extensive 5 or more Multiple options 3 or more Max/dependent (not at baseline     Therapy Diagnosis: generalized weakness, decreased functional mobility , decreased independence with ADL's, and decreased endurance/ activity engagement. Without therapy interventions, patient is at risk for falls, dependence on caregivers for  mobility, dependence on caregivers for ADL's, decreased independence, and failure to return to PLOF.    Rehabilitation Potential: Prognosis: Good;With continued OT s/p acute discharge      Plan:   OT Frequency Recommended: 3-4x/wk   Treatment Interventions: ADL retraining;Functional transfer training;UE strengthening/ROM;Neuro muscular reeducation          Risks/Benefits/POC Discussed with Pt/Family: With patient    Goals:   Goal Formulation: Patient  Time For Goal Achievement: by time of discharge  ADL Goals  Patient will groom self: Stand by Assist;at sinkside;3 visits  Patient will toilet: Stand by Assist;3 visits  Mobility and Transfer Goals  Pt will transfer bed to toilet: Supervision;with rolling walker;5 visits  Neuro Re-Ed Goals  Pt will perform dynamic standing balance: Supervision;for 3 minutes;to complete standing ADLs safely;5 visits                          W312/W312-A    Time of treatment: Time Calculation  OT Received On: 03/17/22  Start Time: 1424  Stop Time: 1437  Time Calculation (min): 13 min  OT Visit Number: 1    Consult received for [name] [name] for OT Evaluation and Treatment.  Patient's medical condition is appropriate for Occupational therapy intervention at this time.    Precautions and Contraindications:   Precautions  Other Precautions: Falls      Medical Diagnosis: Shortness of breath [R06.02]  Scrotal swelling [N50.89]  Acute on chronic congestive heart failure, unspecified heart failure type [I50.9]    History of Present Illness: [name] [name] is a 76 y.o. male admitted on 03/09/2022 with acute CHF and scrotal edema. Patient transferred to [facility] 5/25 due to hypotension.  Patient Active Problem List   Diagnosis    Type 2 diabetes mellitus with complication, with long-term current use of insulin    Heart attack    CAD (coronary artery disease)    Non-ischemic cardiomyopathy    Acute exacerbation of CHF (congestive heart failure)    PUD (peptic ulcer disease)    Acute  dyspnea    CHF (congestive heart failure)    Headaches due to old head injury    Essential hypertension    Syncope and collapse    AICD (automatic cardioverter/defibrillator) present    Cardiomyopathy    SOB (shortness of breath)    GERD (gastroesophageal reflux disease)    Hypertensive heart disease without heart failure    Heart failure, unspecified    Nonischemic cardiomyopathy    Pure hypercholesterolemia    Chronic systolic congestive heart failure    Encounter for implantable defibrillator reprogramming or check    Episode of recurrent major depressive disorder, unspecified depression episode severity    Stage 3 chronic kidney disease, 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    Anxiety and depression    COPD (chronic obstructive pulmonary disease)    HLD (hyperlipidemia)    Sleep apnea, obstructive    Scrotal edema    Hydrocele, unspecified hydrocele type    Acute on chronic congestive heart failure, unspecified heart failure type        Past Medical/Surgical History:  Past Medical History:   Diagnosis Date    Acute CHF     NOV  & DEC 2018, - DIALYSIS CATH PLACED Aug 24 2017 TO REMOVE FLUID     Acute systolic (congestive) heart failure 08/2017    Anxiety     CAD (coronary artery disease)     Cardiomyopathy     nonischemic    CHF (congestive heart failure) 08/12/2014, 2013    Chronic obstructive pulmonary disease     POSSIBLE PER PT HE USES  SYNBICORT BID ABD  BREO INHALER PRN    Coronary artery disease 2011    CORONARY STENT PLACEMENT FOLLOWED BY [facility]    Depression     echocardiogram 02/2016, 11/2016, 09/2017, 12/20/2017    GERD (gastroesophageal reflux disease)     Heart attack 2011    and 2014    Hyperlipemia     Hypertension     ICD (implantable 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     PLACED 2014  [facility]  LAST INTERROGATION April 01 2018 REPORT REQUESTED    Ischemic cardiomyopathy     EF 15% ON ECHO 09-20-2017 DR GARG IN EPIC    Nonischemic cardiomyopathy     NSTEMI (non-ST elevated myocardial  infarction)     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 JUNE 12 WHEN THEY SAW HIM FOR A CL      Past Surgical History:   Procedure Laterality Date    BIV  11/10/2016    ICD METRONIC  UPGRADE     CARDIAC CATHETERIZATION  03/2010    CORONARY STENT PLACEMENT X 2 PER PT; LM normal, LAD 70-80% distal lesion at apex, 20% lesion mid CFX    CARDIAC CATHETERIZATION  12/2012    CARDIAC DEFIBRILLATOR PLACEMENT  12/2012    [facility]    CARDIAC PACEMAKER PLACEMENT  2014    MEDTRONIC ICD/PACEMAKER PLACED [facility]    CIRCUMCISION  AGE 43    COLONOSCOPY  2009    CORRECTION HAMMER TOE      duodenal ulcer  1973    STRESS RELATED IN SCHOOL    ECHOCARDIOGRAM, TRANSTHORACIC  02/2016 11/2016 09/2017 12/2017    ECHOCARDIOGRAM, TRANSTHORACIC  02/2016,11/2016,12/20/2017    EGD N/A 08/15/2014    Procedure: EGD;  Surgeon: Colon Branch, MD;  [facility];  Service: Gastroenterology;  Laterality: N/A;  egd w/ bx    EGD  1975    EGD, COLONOSCOPY N/A 04/04/2018    Procedure: EGD with bxs, COLONOSCOPY with polypectomy and clipping;  Surgeon: Doyne Keel, MD;  Location: [facility];  Service: Gastroenterology;  Laterality: N/A;    FRACTURE SURGERY  AGE 76     RT  ANKLE- CAST APPLIED    HERNIA REPAIR  AGE 43    LEFT INGUINAL HERNIA    MPI nuclear study  06/2016    orthopedic surgery  AGE 46    RT foot corrective - HAMMERTOE    TONSILLECTOMY AND ADENOIDECTOMY  1954         X-Rays/Tests/Labs:  XR Chest AP Portable    Result Date: 03/12/2022  No acute abnormality. Debera Lat Merchant 03/12/2022 4:11 AM    CT Abdomen  Pelvis WO IV/ WO PO Cont    Result Date: 03/11/2022  1. Retroperitoneal, inguinal and mesenteric adenopathy. Differential possibilities include lymphoma or metastatic disease. Percutaneous biopsy is recommended. 2. Moderate scrotal wall thickening. 3. Other findings as above. 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          Objective:   Observation of Patient/Vital Signs:  Patient is in bed with telemetry and peripheral IV in place.         Cognition/Neuro Status  Arousal/Alertness: Appropriate responses to stimuli  Attention Span: Appears intact  Orientation Level: Oriented to person  Memory: Appears intact  Following Commands: minimal verbal instruction  Safety Awareness: minimal verbal instruction  Insights: Educated in safety awareness  Problem Solving: minimal assistance  Behavior: calm;cooperative  Motor Planning: intact  Coordination: intact  Hand Dominance: right handed    Gross ROM  Right Upper Extremity ROM: needs focused assessment  RUE ROM - Shoulder: reduced by 25%  RUE ROM - Elbow: within functional limits  RUE ROM - Hand: within functional limits  Left Upper Extremity ROM: needs  focused assessment  LUE ROM - Shoulder - % Reduced: reduced by 25%  LUE ROM - Elbow - % Reduced: within functional limits  LUE ROM -  Hand - % Reduced: within functional limits  Gross Strength  Right Upper Extremity Strength: 4-/5  Left Upper Extremity Strength: 4-/5          Sensory  Auditory: intact  Visual Acuity: intact       Self-care and Home Management  LB Dressing: Contact Guard Assist;sitting;edge of bed;Don/doff L sock;Don/doff R sock; bringing LE across opposite knee  Toileting: Minimal Assist; standing;setup; steadying; Use of handheld urinal    Mobility and Transfers  Supine to Sit: Contact Guard Assist  Sit to Supine: Contact Guard Assist  Sit to Stand: Minimal Assist with RW from EOB  Stand to Sit: Contact Guard Assist with RW to chair  Functional Mobility/Ambulation: Contact Guard Assist;Minimal Assist with RW; in room in prep for household distances      Balance  Static Sitting Balance: good  Dyanamic Sitting Balance: good  Static Standing Balance: good (with RW)  Dynamic Standing Balance: good;fair (with RW)    Participation and Endurance  Participation Effort: good    AM-PACT "6 Clicks" Daily Activity Inpatient Short Form  Inpatient AM-PACT Performed?: yes  Put On/Take Off Lower Body Clothing: None  Assist with Bathing: A little  Assist with Toileting: None  Put On/Take Off Upper Body Clothing: None  Assist with Grooming: A little  Assist with Eating: None  OT Daily Activity Raw Score: 22  CMS 0-100% Score: 25.80%    PMP - Progressive Mobility Protocol   PMP Activity: Step 6 - Walks in Room  Distance Walked (ft) (Step 6,7): 20 Feet    Treatment Activities:     Educated the patient to role of occupational therapy, plan of care, goals of therapy and HEP, safety with mobility and ADLs. Advised that patient sit at EOB or OOB frequently throughout the day to increase overall activity tolerance and to promote pulmonary hygiene. Educated patient on walking to/from bathroom with nursing staff for increased independence with ADLs. Advised patient to remove small scatter rugs or fall hazards at home for fall prevention. Patient remained  in room in bed with call button within reach and bed alarm on. RN aware of session outcome.      Therapist PPE during session gloves    Felecia Jan, OTR/L  [facility]  Physical Medicine and Rehab Department

## 2022-03-17 NOTE — Plan of Care (Addendum)
Patient is alert and oriented X 4 on room air, setting in chair, watching TV ,call bell within reach, chair alarm is on, no compliant of pain. Shift assessment done. Medication given to patient as per [facility].    Problem: Everyday - Heart Failure  Goal: Stable Vital Signs and Fluid Balance  Flowsheets (Taken 03/17/2022 0915)  Stable Vital Signs and Fluid Balance:   Daily Standing Weights in the morning using the same scale, after using the bathroom and before breadfast.  If unable to stand, zero the bed and use the bed scale   Monitor, assess vital signs and telemetry per policy   Fluid Restriction   Wean oxygen as needed if appropriate   Monitor labs and report abnormalities to physician   Strict Intake/Output   Assess for swelling/edema  Goal: Mobility/Activity is Maintained at Optimal Level for Patient  Flowsheets (Taken 03/17/2022 0915)  Mobility/Activity is Maintained at Optimal Level for Patient:   Increase mobility as tolerated/progressive mobility protocol   Reposition patient every 2 hours and as needed unless able to reposition self   Assess for changes in respiratory status, level of consciousness and/or development of fatigue   Consult with Physical Therapy and/or Occupational Therapy   Maintain SCD's as Ordered   Perform active/passive ROM  Goal: Nutritional Intake is Adequate  Flowsheets (Taken 03/17/2022 0915)  Nutritional Intake is Adequate:   Patient and family teaching on low sodium diet   Cardiac diet-2 gm Sodium   Fluid Restricction if needed   Assess appetite,anorexia and amount of meal/food tolerated   Consult/Collaborate with Nutritionist   Encourage/perform oral hygiene as appropriate  Goal: Teaching-Using CHF Warning Zones and Educational Videos  Flowsheets (Taken 03/17/2022 0915)  Teaching-Using CHF Warning Zones and Educational Videos:   Signs & Symptoms of CHF   Medications   Daily Standing Weights & record   Fluid Restriction if appropriate   Sodium Restriction   CHF Warning Zones and when to  call for help   Document in the Education Tab in EPIC with Teach-back     Problem: Safety  Goal: Patient will be free from injury during hospitalization  Flowsheets (Taken 03/17/2022 0915)  Patient will be free from injury during hospitalization:   Assess patient's risk for falls and implement fall prevention plan of care per policy   Use appropriate transfer methods   Include patient/ family/ care giver in decisions related to safety   Assess for patients risk for elopement and implement Elopement Risk Plan per policy   Provide alternative method of communication if needed (communication boards, writing)   Hourly rounding   Ensure appropriate safety devices are available at the bedside   Provide and maintain safe environment  Goal: Patient will be free from infection during hospitalization  Flowsheets (Taken 03/17/2022 0915)  Free from Infection during hospitalization:   Assess and monitor for signs and symptoms of infection   Monitor all insertion sites (i.e. indwelling lines, tubes, urinary catheters, and drains)   Encourage patient and family to use good hand hygiene technique   Monitor lab/diagnostic results     Problem: Pain  Goal: Pain at adequate level as identified by patient  Flowsheets (Taken 03/17/2022 0915)  Pain at adequate level as identified by patient:   Identify patient comfort function goal   Assess for risk of opioid induced respiratory depression, including snoring/sleep apnea. Alert healthcare team of risk factors identified.   Assess pain on admission, during daily assessment and/or before any "as needed" intervention(s)  Reassess pain within 30-60 minutes of any procedure/intervention, per Pain Assessment, Intervention, Reassessment (AIR) Cycle   Evaluate if patient comfort function goal is met   Offer non-pharmacological pain management interventions   Consult/collaborate with Physical Therapy, Occupational Therapy, and/or Speech Therapy   Include patient/patient care companion in decisions  related to pain management as needed   Consult/collaborate with Pain Service   Evaluate patient's satisfaction with pain management progress     Problem: Side Effects from Pain Analgesia  Goal: Patient will experience minimal side effects of analgesic therapy  Flowsheets (Taken 03/17/2022 0915)  Patient will experience minimal side effects of analgesic therapy:   Monitor/assess patient's respiratory status (RR depth, effort, breath sounds)   Prevent/manage side effects per LIP orders (i.e. nausea, vomiting, pruritus, constipation, urinary retention, etc.)   Evaluate for opioid-induced sedation with appropriate assessment tool (i.e. POSS)   Assess for changes in cognitive function     Problem: Moderate/High Fall Risk Score >5  Goal: Patient will remain free of falls  Flowsheets (Taken 03/17/2022 0915)  High (Greater than 13):   HIGH-Consider use of low bed   HIGH-Pharmacy to initiate evaluation and intervention per protocol   HIGH-Utilize chair pad alarm for patient while in the chair   HIGH-Visual cue at entrance to patient's room   HIGH-Bed alarm on at all times while patient in bed   HIGH-Apply yellow "Fall Risk" arm band   HIGH-Initiate use of floor mats as appropriate     Problem: Fluid and Electrolyte Imbalance/ Endocrine  Goal: Fluid and electrolyte balance are achieved/maintained  Flowsheets (Taken 03/17/2022 0915)  Fluid and electrolyte balance are achieved/maintained:   Monitor intake and output every shift   Assess for confusion/personality changes   Observe for seizure activity and initiate seizure precautions if indicated   Monitor for muscle weakness   Monitor daily weight   Monitor/assess lab values and report abnormal values   Assess and reassess fluid and electrolyte status   Provide adequate hydration   Observe for cardiac arrhythmias  Goal: Adequate hydration  Flowsheets (Taken 03/17/2022 0915)  Adequate hydration:   Assess mucus membranes, skin color, turgor, perfusion and presence of edema   Assess  for peripheral, sacral, periorbital and abdominal edema   Monitor and assess vital signs and perfusion

## 2022-03-17 NOTE — Nursing Progress Note (Signed)
FOUR EYES SKIN ASSESSMENT NOTE    [name] [name]  Feb 25, 1946  [id number]    Braden Scale Score: 19    POC Initiated for Risk for Altered Skin: Yes    Mepilex Dressing Applied to sacrum/heel if any PI risk factors present: No      If Wound / Pressure Injury Present:    Wound / PI Documented on Patient Avatar Yes    Wound / PI assessment documented in Flowsheet: Yes    Admitting Physician notified: Yes    Wound Consult ordered: No      Dorthula Matas, RN  Mar 17, 2022  6:02 PM    Second RN Name: Vernona Rieger, RN

## 2022-03-17 NOTE — Progress Notes (Signed)
Radiology notified at 2244 that pt has a DVT in RLE.  Dr. Minus Liberty made aware.  Standing weight and VSS taken. Lovenox ordered, lab notified for aPTT draw.

## 2022-03-17 NOTE — Nursing Progress Note (Signed)
Assumed care of patient at 0300. Assessment at this time unchanged from prior assessment. Pt resting comfortably. Will continue to monitor, assess and provide supportive care.

## 2022-03-17 NOTE — PT Plan of Care Note (Signed)
Physical Therapy Cancellation Note    Patient: [name] [name]  LKH:[id number]    Unit: [id number]/[id number]    Patient not seen for physical therapy secondary to patient reported he walked with nursing this morning and feeling very tired and want to sleep. Decline PT treatment at this time. PT will continue to follow.    Clance Baquero Eric Form, DPT  [facility]   Physical Medicine and Rehabilitation Dept

## 2022-03-17 NOTE — Consults (Signed)
WOC Nurse Wound Consult:    [name] [name] is a 76 y.o. male admitted with acute on chronic congestive heart failure    Reason for consult: evaluation of scrotal wound    Past Medical History:   Diagnosis Date    Acute CHF     NOV  & DEC 2018, - DIALYSIS CATH PLACED Aug 24 2017 TO REMOVE FLUID     Acute systolic (congestive) heart failure 08/2017    Anxiety     CAD (coronary artery disease)     Cardiomyopathy     nonischemic    CHF (congestive heart failure) 08/12/2014, 2013    Chronic obstructive pulmonary disease     POSSIBLE PER PT HE USES  SYNBICORT BID ABD  BREO INHALER PRN    Coronary artery disease 2011    CORONARY STENT PLACEMENT FOLLOWED BY [facility]    Depression     echocardiogram 02/2016, 11/2016, 09/2017, 12/20/2017    GERD (gastroesophageal reflux disease)     Heart attack 2011    and 2014    Hyperlipemia     Hypertension     ICD (implantable 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     PLACED 2014  [facility]  LAST INTERROGATION April 01 2018 REPORT REQUESTED    Ischemic cardiomyopathy     EF 15% ON ECHO 09-20-2017 DR GARG IN EPIC    Nonischemic cardiomyopathy     NSTEMI (non-ST elevated myocardial infarction)     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    Pacemaker 2014    MEDTRONIC ICD/PACEMAKER COMBO LAST INTERROGATION  12-12 2018    Pneumonia 09/2016    Primary cardiomyopathy     Ischemic cardiomyopathy EF 15% ON ECHO 09-20-2017 DR GARG IN EPIC    Sepsis 08/2017    Syncope and collapse     PRIOR TO ICD/PACEMAKER PLACED 2014    Type 2 diabetes mellitus, controlled DX 1998    BS  AVG 100  A1C 7.4 Dec 27 2017    Wheeze     PT SEE HIS PMD April 01 2018 RE THIS-TOLD TO SEE PMD BY [facility] JUNE 12 WHEN THEY SAW HIM FOR A CL     Past Surgical History:   Procedure Laterality Date    BIV  11/10/2016    ICD METRONIC  UPGRADE     CARDIAC CATHETERIZATION  03/2010    CORONARY STENT PLACEMENT X 2 PER PT; LM normal, LAD 70-80% distal lesion at apex, 20% lesion mid CFX    CARDIAC CATHETERIZATION  12/2012    CARDIAC DEFIBRILLATOR  PLACEMENT  12/2012    [facility]    CARDIAC PACEMAKER PLACEMENT  2014    MEDTRONIC ICD/PACEMAKER PLACED [facility]    CIRCUMCISION  AGE 3    COLONOSCOPY  2009    CORRECTION HAMMER TOE      duodenal ulcer  1973    STRESS RELATED IN SCHOOL    ECHOCARDIOGRAM, TRANSTHORACIC  02/2016 11/2016 09/2017 12/2017    ECHOCARDIOGRAM, TRANSTHORACIC  02/2016,11/2016,12/20/2017    EGD N/A 08/15/2014    Procedure: EGD;  Surgeon: Colon Branch, MD;  Location: [facility];  Service: Gastroenterology;  Laterality: N/A;  egd w/ bx    EGD  1975    EGD, COLONOSCOPY N/A 04/04/2018    Procedure: EGD with bxs, COLONOSCOPY with polypectomy and clipping;  Surgeon: Doyne Keel, MD;  Location: [facility];  Service: Gastroenterology;  Laterality: N/A;    FRACTURE SURGERY  AGE 61  RT  ANKLE- CAST APPLIED    HERNIA REPAIR  AGE 79    LEFT INGUINAL HERNIA    MPI nuclear study  06/2016    orthopedic surgery  AGE 12    RT foot corrective - HAMMERTOE    TONSILLECTOMY AND ADENOIDECTOMY  1954     General assessment:  Mobility: up with minimal assistance  Braden score: 17  Continence/Incontinence: continent  Bed: Progressa LAL   Diet: Heart healthy Na restriction, 2 Gm, 1500 ml fluid restriction, consistent carb    Wound assessment:  Location: scrotum   Etiology (stage if pressure related): MASD, fungal rash  Wound base: open wound on right lateral side of scrotum, peeling skin with plaques of dried exudate adherent to skin on scrotum  Measurements: open wound ~ 1.5 cm x 1.5 cm x 1.5 cm  Periwound: peeling skin with plaques of dried exudate adherent to skin on scrotum  Exudate amount and type: scant serosanguinous  Odor: none  Pain: none  Tunneling/undermining: none  Exposed tendon/ligament, muscle, or bone: none    Plan:    Local skin and wound care to patient's scrotal wound and periwound skin with application of Secura cream. Elevate scrotum.    PI prevention:  Positioning device to the bedside  Eliminate/minimize pressure from  area  Float heels with boots or pillows  Turn patient  Pressure redistribution cushion to chair  Use lift sheets/low friction surface sheets for positioning  Foam dressings to bony prominences  Nutrition consult/Optimize nutrition  Moisture/Incontinence management. Cleanse with incontinence cleansing wipe/water to manage incontinence and to protect skin from exposure to urine and stool. Apply skin barrier protection cream.     WOC team will continue to monitor patient for wound and skin care needs.    Lesly Rubenstein, BSN, RN, [facility]  Wound Care Coordinator  Spectra link # [phone]

## 2022-03-17 NOTE — Significant Event (Signed)
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

## 2022-03-17 NOTE — Plan of Care (Signed)
Problem: Everyday - Heart Failure  Goal: Stable Vital Signs and Fluid Balance  03/17/2022 2347 by Adaline Sill, RN  Outcome: Progressing  Flowsheets (Taken 03/17/2022 2347)  Stable Vital Signs and Fluid Balance:   Daily Standing Weights in the morning using the same scale, after using the bathroom and before breadfast.  If unable to stand, zero the bed and use the bed scale   Monitor, assess vital signs and telemetry per policy   Strict Intake/Output   Monitor labs and report abnormalities to physician   Assess for swelling/edema  03/17/2022 2346 by Adaline Sill, RN  Outcome: Progressing  Goal: Mobility/Activity is Maintained at Optimal Level for Patient  03/17/2022 2347 by Adaline Sill, RN  Outcome: Progressing  Flowsheets (Taken 03/17/2022 2347)  Mobility/Activity is Maintained at Optimal Level for Patient:   Increase mobility as tolerated/progressive mobility protocol   Reposition patient every 2 hours and as needed unless able to reposition self   Assess for changes in respiratory status, level of consciousness and/or development of fatigue  03/17/2022 2346 by Adaline Sill, RN  Outcome: Progressing  Goal: Nutritional Intake is Adequate  03/17/2022 2347 by Adaline Sill, RN  Outcome: Progressing  Flowsheets (Taken 03/17/2022 2347)  Nutritional Intake is Adequate:   Consult/Collaborate with Nutritionist   Assess appetite,anorexia and amount of meal/food tolerated  03/17/2022 2346 by Adaline Sill, RN  Outcome: Progressing  Goal: Teaching-Using CHF Warning Zones and Educational Videos  03/17/2022 2347 by Adaline Sill, RN  Outcome: Progressing  Flowsheets (Taken 03/17/2022 2347)  Teaching-Using CHF Warning Zones and Educational Videos:   Signs & Symptoms of CHF   Daily Standing Weights & record   CHF Warning Zones and when to call for help  03/17/2022 2346 by Adaline Sill, RN  Outcome: Progressing     Problem: Safety  Goal: Patient will be free from injury during hospitalization  Outcome: Progressing  Flowsheets  (Taken 03/17/2022 2347)  Patient will be free from injury during hospitalization:   Assess patient's risk for falls and implement fall prevention plan of care per policy   Provide and maintain safe environment   Assess for patients risk for elopement and implement Elopement Risk Plan per policy   Use appropriate transfer methods  Goal: Patient will be free from infection during hospitalization  Outcome: Progressing  Flowsheets (Taken 03/17/2022 0915 by Cherly Hensen, RN)  Free from Infection during hospitalization:   Assess and monitor for signs and symptoms of infection   Monitor all insertion sites (i.e. indwelling lines, tubes, urinary catheters, and drains)   Encourage patient and family to use good hand hygiene technique   Monitor lab/diagnostic results     Problem: Moderate/High Fall Risk Score >5  Goal: Patient will remain free of falls  Outcome: Progressing  Flowsheets (Taken 03/17/2022 2300)  High (Greater than 13):   HIGH-Visual cue at entrance to patient's room   HIGH-Bed alarm on at all times while patient in bed   HIGH-Utilize chair pad alarm for patient while in the chair   HIGH-Apply yellow "Fall Risk" arm band   HIGH-Initiate use of floor mats as appropriate   HIGH-Consider use of low bed     Problem: Diabetes: Glucose Imbalance  Goal: Blood glucose stable at established goal  Outcome: Progressing  Flowsheets (Taken 03/17/2022 2347)  Blood glucose stable at established goal:   Monitor lab values   Monitor intake and output.  Notify LIP if urine output is < 30 mL/hour.   Assess for hypoglycemia /hyperglycemia  Monitor/assess vital signs   Ensure appropriate diet and assess tolerance     Problem: Compromised Tissue integrity  Goal: Damaged tissue is healing and protected  Outcome: Progressing  Flowsheets (Taken 03/17/2022 2347)  Damaged tissue is healing and protected:   Monitor/assess Braden scale every shift   Provide wound care per wound care algorithm   Increase activity as tolerated/progressive  mobility   Keep intact skin clean and dry     Problem: Venous Thrombotic Event  Goal: Free from evidence of bleeding  Outcome: Progressing  Flowsheets (Taken 03/17/2022 2347)  Free from evidence of bleeding:   Administer VTE prophylaxis as prescribed   Administer Anticoagulants as prescribed   Initiate anticoagulation precautions   Assess patient's risk for falls and implement fall prevention plan of care per policy   Monitor for signs and symptoms of bleeding   Monitor for adverse reactions to anticoagulant   Monitor results of ordered laboratory studies  Goal: Patient / Caregiver demonstrate/acknowledge a basic survival level of understanding on disease process, treatment plan, medications and discharge plan  Outcome: Progressing  Flowsheets (Taken 03/17/2022 2347)  Patient / Caregiver demonstrates/acknowledges a basic survival level of understanding on disease process, treatment plan, medications and discharge plan:   Patient will be in targeted therapeutic range for medications and be free from evidence of bleeding   Monitor for signs and symptoms of bleeding   Monitor for Signs of VTE (edema of calf/thigh, redness, pain)   Provide anticoagulation education if indicated (e.g., heparin, Warfarin, Enoxaparin, Lepirudin)

## 2022-03-17 NOTE — Progress Notes (Signed)
[facility] C[facility] PROGRESS NOTE    [facility]   [facility] INTERMEDIATE CARE UNIT  [address]  [place] [address]  Dept: [phone]  Dept Fax: [phone]  Loc: [phone]    Date Time: 03/17/22 8:42 AM  Patient Name: [name] [name] - [place] Day: 8    Subjective/Cardiac Relevant Events:   No acute events overnight.  Improved scrotal edema.  Now on antibiotics for possible cellulitis.  Mild hypotension continues.    ASSESSMENT:   Patient is a 76 y.o. male with the following relevant diagnoses:    Recent admission for acute on chronic systolic CHF and scrotal edema  EF 25 to 30% by echo August 2022.  Moderate tricuspid and mitral regurgitation present.  PA pressures were 54 mmHg.   (Typical GDMT regimen includes Coreg 25 twice daily, Entresto 97-103 twice daily, Jardiance 10 daily, spironolactone 12 5 mg daily)  Typical outpatient diuretic regimen includes torsemide 40 mg a.m. and 20 mg p.m.  Persistent hypotension  Possible groin cellulitis  Nonischemic cardiomyopathy with EF as low as 15% May 2021, now up to 25 to 30% by echo August 2022  Negative cardiac catheterization for coronary disease 2014  Medtronic ICD in place  Diabetes  Hypertension  CLL       RECOMMENDATIONS:     Kidney function improved somewhat after reduction in diuretics yesterday and small volume fluid administration.  Noting however that his typical outpatient diuretic regimen includes torsemide 40 mg a.m. and 20 mg p.m. we will not want for him to get hypervolemic again.  Consider transitioning back to his usual outpatient p.o. regimen today.  Will monitor for improvement in BP on antitbiotic therapy.  GDMT is on hold in light of hypotension though I would favor resuming Toprol-XL 25 mg at least (noting that he is on Coreg 25 twice daily as an outpatient and does have ectopy/NS-VT on telemetry)      Telemetry/Labs Reviewed/Intake-Output:     Telemetry reviewed: NSR, occasional PVCs/NSVT    Recent Labs    Lab 03/11/22  2325   hs Troponin-I 14.5               Recent Labs   Lab 03/12/22  0349   Bilirubin, Total 1.5*   Protein, Total 5.4*   Albumin 2.8*   ALT 9   AST (SGOT) 8       Recent Labs   Lab 03/17/22  0240   Magnesium 2.5           Recent Labs   Lab 03/17/22  0240 03/16/22  0319 03/15/22  0138   WBC 43.10* 37.78* 34.07*   Hgb 10.2* 11.0* 11.5*   Hematocrit 31.8* 33.1* 34.7*   Platelets 197 202 186       Recent Labs   Lab 03/17/22  0240 03/16/22  0319 03/15/22  0138   Sodium 130* 131* 132*   Potassium 4.6 4.6 4.6   Chloride 100 100 101   CO2 20 21 20    BUN 58.0* 60.0* 58.0*   Creatinine 1.6* 1.9* 1.7*   eGFR 44.4* 36.0* 41.2*   Glucose 202* 238* 288*   Calcium 8.1 8.0 8.1             Invalid input(s): FREET4    .  Lab Results   Component Value Date    BNP 3,115 (H) 03/11/2022            Intake/Output Summary (Last 24 hours) at 03/17/2022 3244  Last data filed  at 03/17/2022 0741  Gross per 24 hour   Intake 1125 ml   Output 1375 ml   Net -250 ml         Medications:      Scheduled Meds: PRN Meds:    acetaminophen, 650 mg, Oral, Q6H  amoxicillin-clavulanate, 1 tablet, Oral, Q12H SCH  aspirin EC, 81 mg, Oral, Daily  atorvastatin, 40 mg, Oral, QHS  enoxaparin, 40 mg, Subcutaneous, Q24H SCH  fluticasone furoate-vilanterol, 1 puff, Inhalation, QAM  furosemide, 20 mg, Oral, Daily  insulin glargine, 30 Units, Subcutaneous, QAM  insulin lispro, 1-6 Units, Subcutaneous, QHS  insulin lispro, 2-10 Units, Subcutaneous, TID AC  melatonin, 3 mg, Oral, QHS  niacin, 500 mg, Oral, BID  nystatin, , Topical, BID  PARoxetine, 20 mg, Oral, QAM  polyethylene glycol, 17 g, Oral, Daily  senna-docusate, 1 tablet, Oral, QHS        Continuous Infusions:   dextrose, 15 g of glucose, PRN   Or  dextrose, 12.5 g, PRN   Or  dextrose, 12.5 g, PRN   Or  glucagon (rDNA), 1 mg, PRN  lidocaine, , Q6H PRN  ondansetron, 4 mg, Q8H PRN          Physical Exam:     Vitals:    03/17/22 0555 03/17/22 0600 03/17/22 0700 03/17/22 0741   BP:  105/69 94/71     Pulse:  90 87    Resp:  20     Temp: 97.8 F (36.6 C)      TempSrc: Temporal      SpO2:  96% 94%    Weight:    98.1 kg (216 lb 3.2 oz)   Height:             03/09/2022    12:24 PM 03/10/2022     4:09 AM 03/11/2022     4:45 AM 03/12/2022    12:32 AM 03/13/2022     6:00 AM 03/16/2022     6:00 AM 03/17/2022     7:41 AM   Weight Monitoring   Height 182.9 cm    182.9 cm     Height Method     Stated     Weight  97.614 kg 96.299 kg 95.7 kg 95.6 kg 99.2 kg 98.068 kg   Weight Method  Standing Scale Standing Scale Bed Scale Bed Scale Bed Scale Standing Scale   BMI (calculated)     28.6 kg/m2         GENERAL: Patient is in no acute distress   HEENT: Moist mucous membranes   NECK: No jugular venous distention  CARDIAC: Regular rate and rhythm, with normal S1 and S2, and no murmurs,  CHEST: Clear to auscultation bilaterally, normal respiratory effort  ABDOMEN: Soft, nontender, non-distended, good bowel sounds   EXTREMITIES: No edema, +scrotal edema, 2+ radial pulses palpable   SKIN: No rash or jaundice   NEUROLOGIC: Alert and oriented to time, place and person, normal mood and affect    MUSCULOSKELETAL: Normal muscle strength and tone.    -------------------------------------------------------------------------------------  Signed by:         Clarice Pole, MD         [facility]    [facility] Contact Information   [name] [name] [place]  Secure Chat (Group):   [facility] [place] [facility]    APP Spectralink:  [phone]    MD Spectralink :  [phone]  [phone]    After hours, non urgent consult line:  [phone]    After hours,  physician on-call:  [phone] [facility]  Secure Chat (Group):   [facility] [place] [facility]    APP Spectralink:  [phone]    MD Spectralink :  [phone]      After hours, non urgent consult line:  [phone]    After hours, physician on-call:  [phone] [facility]  Secure Chat (Group):   [facility]    APP Spectralink:  [phone]    MD Spectralink :  [phone]      After hours, non urgent  consult line:  [phone]    After hours, physician on-call:  [phone] [facility]  Secure Chat (Group):   [facility]    APP Spectralink:  [phone]    MD Spectralink :  [phone]      After hours, non urgent consult line:  [phone]    After hours, physician on-call:  [phone]       This note was generated by the Dragon speech recognition and may contain errors or omissions not intended by the user. Grammatical errors, random word insertions, deletions, pronoun errors, and incomplete sentences are occasional consequences of this technology due to software limitations. Not all errors are caught or corrected. If there are questions or concerns about the content of this note or information contained within the body of this dictation, they should be addressed directly with the author for clarification.

## 2022-03-17 NOTE — Progress Notes (Signed)
03/17/22 1744   Vital Signs   Level of Consciousness Alert   Temp 97.9 F (36.6 C)   Temp Source Temporal   Heart Rate 78   Resp Rate 17   BP 91/61   Oxygen Therapy   SpO2 94 %   O2 Device None (Room air)     Pt admitted to unit [address] PCU to bed 5124 from 3 west, in bed already. Pt belongings at bedside. VSS. Pt oriented to room and to floor. Head to toe assessment and pain assessment completed. Heart rhythm confirmed with monitor room. Skin assessment completed with second RN. All safety measures in place, pt educated on fall safety, and call bell and personal belongings within reach. Hourly rounding ongoing.

## 2022-03-17 NOTE — Progress Notes (Signed)
Infectious Disease            Progress Note    03/17/2022   [name] [name] ZOX:[id number] is a 76 y.o. male, with history significant for diabetes mellitus, hypertension, chronic kidney disease, anxiety, depression, sleep apnea, COPD, hyperlipidemia, congestive heart failure, nonischemic cardiomyopathy, status post AICD placement, CAD with leukocytosis, lymphadenopathy.    Subjective:     [name] today Symptoms: Afebrile, sitting on chair, looks comfortable, no new complaints, still leukocytosis, H&H stable, no vomiting. Other review of system is non contributory.    Objective:     Blood pressure 97/64, pulse 83, temperature 97.8 F (36.6 C), temperature source Temporal, resp. rate 20, height 1.829 m (6'), weight 98.1 kg (216 lb 3.2 oz), SpO2 95 %.    General Appearance: No acute distress  HEENT: Pallor negative, Anicteric sclera.    Neck:    Supple  Lungs: Decreased breath sound at bases.    Chest Wall: Symmetric chest wall expansion.   Heart : S1 and S2.   Abdomen: Abdomen is soft, bowel sounds positive.  Neurological: Alert and oriented to person, place and time.  Moves all extremities  Extremities: Lower extremity edema  Skin/scrotum: Scrotal swelling and tenderness  Psychiatric: Mood and affect is normal    Laboratory And Diagnostic Studies:     Recent Labs     03/17/22  0240 03/16/22  0319 03/15/22  0138   WBC 43.10* 37.78* 34.07*   Hgb 10.2* 11.0* 11.5*   Hematocrit 31.8* 33.1* 34.7*   Platelets 197 202 186   Segmented Neutrophils  --  14 10       Recent Labs     03/17/22  0240 03/16/22  0319   Sodium 130* 131*   Potassium 4.6 4.6   Chloride 100 100   CO2 20 21   BUN 58.0* 60.0*   Creatinine 1.6* 1.9*   Glucose 202* 238*   Calcium 8.1 8.0         Current Med's:     Current Facility-Administered Medications   Medication Dose Route Frequency    acetaminophen  650 mg Oral Q6H    amoxicillin-clavulanate  1 tablet Oral Q12H [facility]    aspirin EC  81 mg Oral Daily    atorvastatin  40 mg  Oral QHS    enoxaparin  40 mg Subcutaneous Q24H SCH    fluticasone furoate-vilanterol  1 puff Inhalation QAM    furosemide  20 mg Oral Daily    insulin glargine  30 Units Subcutaneous QAM    insulin lispro  1-6 Units Subcutaneous QHS    insulin lispro  2-10 Units Subcutaneous TID AC    melatonin  3 mg Oral QHS    niacin  500 mg Oral BID    nystatin   Topical BID    PARoxetine  20 mg Oral QAM    polyethylene glycol  17 g Oral Daily    senna-docusate  1 tablet Oral QHS       Lines/Drains:     Patient Lines/Drains/Airways Status       Active Lines, Drains and Airways       Name Placement date Placement time Site Days    Peripheral IV 03/12/22 22 G Left;Posterior Forearm 03/12/22  0100  Forearm  5    Peripheral IV 03/14/22 20 G Anterior;Distal;Right Forearm 03/14/22  0400  Forearm  3  Assessment:     Condition: Guarded  Lower extremity cellulitis  Retroperitoneal, inguinal, mesenteric lymphadenopathy  CLL  Congestive heart failure  Cardiomyopathy  S/p ICD placement  Diabetes mellitus  Left hydrocele  Chronic kidney disease  Hypertension  Coronary artery disease  Chronic obstructive pulmonary disease  Hyperlipidemia  Sleep apnea  Depression    Plan:     Continue Augmentin  Hematology follow-up  Cardiology follow-up  Correction of electrolytes  Discussed with treatment team  Urology follow-up  Physical therapy          Alfonzo Beers, MD, M.D.,FACP  03/17/2022  8:51 AM          *This note was generated by the Epic EMR system/ Dragon speech recognition and may contain inherent errors or omissions not intended by the user. Grammatical errors, random word insertions, deletions, pronoun errors and incomplete sentences are occasional consequences of this technology due to software limitations. Not all errors are caught or corrected. If there are questions or concerns about the content of this note or information contained within the body of this dictation they should be addressed directly with the author  for clarification

## 2022-03-17 NOTE — Plan of Care (Signed)
Problem: Everyday - Heart Failure  Goal: Stable Vital Signs and Fluid Balance  Flowsheets (Taken 03/12/2022 1425 by Drexler, Trae, RN)  Stable Vital Signs and Fluid Balance:   Daily Standing Weights in the morning using the same scale, after using the bathroom and before breadfast.  If unable to stand, zero the bed and use the bed scale   Monitor, assess vital signs and telemetry per policy   Fluid Restriction   Assess for swelling/edema   Monitor labs and report abnormalities to physician     Problem: Safety  Goal: Patient will be free from injury during hospitalization  Flowsheets (Taken 03/11/2022 1034 by Theodoro Doing, RN)  Patient will be free from injury during hospitalization:   Assess patient's risk for falls and implement fall prevention plan of care per policy   Use appropriate transfer methods   Ensure appropriate safety devices are available at the bedside   Provide and maintain safe environment   Hourly rounding   Include patient/ family/ care giver in decisions related to safety   Assess for patients risk for elopement and implement Elopement Risk Plan per policy     Problem: Moderate/High Fall Risk Score >5  Goal: Patient will remain free of falls  Flowsheets (Taken 03/16/2022 2000)  High (Greater than 13):   HIGH-Consider use of low bed   HIGH-Apply yellow "Fall Risk" arm band   HIGH-Bed alarm on at all times while patient in bed     Problem: Diabetes: Glucose Imbalance  Goal: Blood glucose stable at established goal  Flowsheets (Taken 03/15/2022 2154 by Carroll Sage, RN)  Blood glucose stable at established goal:   Monitor lab values   Monitor intake and output.  Notify LIP if urine output is < 30 mL/hour.   Follow fluid restrictions/IV/PO parameters   Assess for hypoglycemia /hyperglycemia   Include patient/family in decisions related to nutrition/dietary selections   Monitor/assess vital signs   Coordinate medication administration with meals, as indicated   Ensure appropriate diet and  assess tolerance   Ensure adequate hydration   Ensure appropriate consults are obtained (Nutrition, Diabetes Education, and Case Management)   Ensure patient/family has adequate teaching materials

## 2022-03-17 NOTE — Progress Notes (Addendum)
2135: Pt transported to [place] for doppler. VSS. Tele order modified.    2253: Pt returned from [place]. VSS.

## 2022-03-18 ENCOUNTER — Inpatient Hospital Stay: Payer: Commercial Managed Care - POS

## 2022-03-18 DIAGNOSIS — C9110 Chronic lymphocytic leukemia of B-cell type not having achieved remission: Secondary | ICD-10-CM

## 2022-03-18 DIAGNOSIS — L03116 Cellulitis of left lower limb: Secondary | ICD-10-CM

## 2022-03-18 DIAGNOSIS — N170 Acute kidney failure with tubular necrosis: Secondary | ICD-10-CM

## 2022-03-18 LAB — CBC AND DIFFERENTIAL
Absolute NRBC: 0 10*3/uL (ref 0.00–0.00)
Hematocrit: 32.8 % — ABNORMAL LOW (ref 37.6–49.6)
Hgb: 10.6 g/dL — ABNORMAL LOW (ref 12.5–17.1)
Instrument Absolute Neutrophil Count: 5.77 10*3/uL (ref 1.10–6.33)
MCH: 33.8 pg — ABNORMAL HIGH (ref 25.1–33.5)
MCHC: 32.3 g/dL (ref 31.5–35.8)
MCV: 104.5 fL — ABNORMAL HIGH (ref 78.0–96.0)
MPV: 11.5 fL (ref 8.9–12.5)
Nucleated RBC: 0 /100 WBC (ref 0.0–0.0)
Platelets: 207 10*3/uL (ref 142–346)
RBC: 3.14 10*6/uL — ABNORMAL LOW (ref 4.20–5.90)
RDW: 14 % (ref 11–15)
WBC: 49.06 10*3/uL — ABNORMAL HIGH (ref 3.10–9.50)

## 2022-03-18 LAB — APTT: PTT: 34 s (ref 27–39)

## 2022-03-18 LAB — MAN DIFF ONLY
Band Neutrophils Absolute: 0.98 10*3/uL (ref 0.00–1.00)
Band Neutrophils: 2 %
Basophils Absolute Manual: 0 10*3/uL (ref 0.00–0.08)
Basophils Manual: 0 %
Eosinophils Absolute Manual: 5.89 10*3/uL — ABNORMAL HIGH (ref 0.00–0.44)
Eosinophils Manual: 12 %
Lymphocytes Absolute Manual: 27.47 10*3/uL — ABNORMAL HIGH (ref 0.42–3.22)
Lymphocytes Manual: 56 %
Monocytes Absolute: 0 10*3/uL — ABNORMAL LOW (ref 0.21–0.85)
Monocytes Manual: 0 %
Neutrophils Absolute Manual: 14.72 10*3/uL — ABNORMAL HIGH (ref 1.10–6.33)
Segmented Neutrophils: 30 %

## 2022-03-18 LAB — GLUCOSE WHOLE BLOOD - POCT
Whole Blood Glucose POCT: 166 mg/dL — ABNORMAL HIGH (ref 70–100)
Whole Blood Glucose POCT: 199 mg/dL — ABNORMAL HIGH (ref 70–100)
Whole Blood Glucose POCT: 156 mg/dL — ABNORMAL HIGH (ref 70–100)
Whole Blood Glucose POCT: 185 mg/dL — ABNORMAL HIGH (ref 70–100)

## 2022-03-18 LAB — PT/INR
PT INR: 1.3 — ABNORMAL HIGH (ref 0.9–1.1)
PT: 15.6 s — ABNORMAL HIGH (ref 10.1–12.9)

## 2022-03-18 LAB — CELL MORPHOLOGY
Cell Morphology: NORMAL
Platelet Estimate: NORMAL

## 2022-03-18 LAB — MAGNESIUM: Magnesium: 2.4 mg/dL (ref 1.6–2.6)

## 2022-03-18 LAB — BASIC METABOLIC PANEL
Anion Gap: 10 (ref 5.0–15.0)
BUN: 52 mg/dL — ABNORMAL HIGH (ref 9.0–28.0)
CO2: 23 mEq/L (ref 17–29)
Calcium: 8 mg/dL (ref 7.9–10.2)
Chloride: 102 mEq/L (ref 99–111)
Creatinine: 1.6 mg/dL — ABNORMAL HIGH (ref 0.5–1.5)
Glucose: 192 mg/dL — ABNORMAL HIGH (ref 70–100)
Potassium: 4.8 mEq/L (ref 3.5–5.3)
Sodium: 135 mEq/L (ref 135–145)
eGFR: 44.4 mL/min/{1.73_m2} — AB (ref >=60)

## 2022-03-18 LAB — PHOSPHORUS: Phosphorus: 3.4 mg/dL (ref 2.3–4.7)

## 2022-03-18 MED ORDER — METOPROLOL SUCCINATE ER 25 MG PO TB24
12.5000 mg | ORAL_TABLET | Freq: Every day | ORAL | Status: DC
Start: 2022-03-18 — End: 2022-03-20
  Administered 2022-03-18 – 2022-03-20 (×3): 12.5 mg via ORAL
  Filled 2022-03-18 (×3): qty 1

## 2022-03-18 MED ORDER — METOPROLOL SUCCINATE ER 25 MG PO TB24
25.0000 mg | ORAL_TABLET | Freq: Every day | ORAL | Status: DC
Start: 2022-03-18 — End: 2022-03-18

## 2022-03-18 MED ORDER — SPIRONOLACTONE 25 MG PO TABS
12.5000 mg | ORAL_TABLET | Freq: Every day | ORAL | Status: DC
Start: 2022-03-18 — End: 2022-03-20
  Administered 2022-03-18 – 2022-03-20 (×3): 12.5 mg via ORAL
  Filled 2022-03-18 (×3): qty 1

## 2022-03-18 NOTE — Progress Notes (Signed)
[name] HOSPITALIST  Progress Note    Patient Info:   Date/Time: 03/18/2022 / 8:18 AM   Patient Name:[name] [name]   JYN:[id number]    PCP: Zorita Pang, MD   Admit Date:03/09/2022   Attending Physician:Christel Bai, Oleta Mouse, MD      Assessment and Plan:     76 year old gentleman admitted on 5/22 with dyspnea, orthopnea, scrotal swelling.  S/P IMCU transfer on 5/25 secondary to hypotension.  Transferred back to the floors 5/30.    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  S/P ICD.  EKG on admission shows paced rhythm.  High sensitivity troponin negative x 3.  NT proBNP on admission elevated at 4091.  CXR on admission shows no acute process.  CT abdomen/pelvis on 5/24 shows retroperitoneal, inguinal and mesenteric adenopathy, moderate scrotal wall thickening.  Recent echo on 05/29/21 shows EF of 25-30%, grade III diastolic dysfunction, moderately dilated LA, moderate MR, moderate TR, moderate pulmonary hypertension.      S/P IV lasix.  Noted to by hypotensive on 5/25, possibly due to overdiuresis.  S/P levophed infusion, which was stopped on 5/27.  Oral lasix resumed 5/28.  Home torsemide regimen resumed 5/30, which includes torsemide 40 mg in AM, 20 mg in PM.  Toprol 25 mg daily regimen to be started today.  Of note, he is on coreg 25 mg bid as outpatient, and does have ectopy on telemetry.  Hold entresto, aldactone, metolazone.  Appreciate cardiology, heart failure input    AKI on CKD stage 3  Baseline Cr of 1.6-1.8 per EMR.  Cr at 1.7 on admission, but peaked to as high as 2.3 on 5/25, likely due to ATN in setting of hypoperfusion + hypotension.  Cr presently at 1.6.  Monitor closely while on heart failure medication regimen    RLE DVT  Venous dopplers on 5/30 show occlusive thrombus in the right common femoral vein and distal right external iliac vein. On lovenox     LLE cellulitis  S/P rocephin.  On augmentin, day 3.  Appreciate ID input    Moderate  complex left hydrocele  With septations.  Noted on scrotal US from 5/19.  Scrotal elevation, scrotal support, mobilization recommended.  Scrotal edema likely secondary to CLL and CHF.  No surgery at this time.  Follow up with urology as outpatient, Dr. Edwyna Shell, for moderate complex left hydrocele.  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  Continue lantus, SSI.  Hold jardiance, janumet for now    CAD  Nonobstructive.  Continue aspirin, lipitor    Macrocytic anemia  Hb is stable.  Monitor     COPD  Without exacerbation.  Continue breo    Depression  Continue paxil    Fluids/electrolytes/nutrition  No IVF  Replete electrolytes prn  Cardiac diet, 1.5 L fluid restriction    DVT Prophylaxis  On lovenox    Social  Full code    Disposition  Acute rehab      DVT Prohylaxis:lovenox    Code Status: Full Code   Disposition:  acute rehab   Type of Admission:Inpatient   Estimated Length of Stay (including stay in the ER receiving treatment): > 2 days   Medical Necessity for stay:  heart failure optimization     Subjective:   Chief Complaint:  Chief Complaint   Patient  presents with    Shortness of Breath    Groin Swelling    Leg Swelling        Breathing at baseline.  No chest pain.   Medications:Meds have been reviewed   Current Facility-Administered Medications   Medication Dose Route Frequency Last Rate Last Admin    acetaminophen (TYLENOL) tablet 650 mg  650 mg Oral Q6H   650 mg at 03/18/22 0553    amoxicillin-clavulanate (AUGMENTIN) 500-125 MG per tablet 1 tablet  1 tablet Oral Q12H [facility]   1 tablet at 03/18/22 1610    aspirin EC tablet 81 mg  81 mg Oral Daily   81 mg at 03/18/22 0806    atorvastatin (LIPITOR) tablet 40 mg  40 mg Oral QHS   40 mg at 03/17/22 2106    dextrose (GLUCOSE) 40 % oral gel 15 g of glucose  15 g of glucose Oral PRN        Or    dextrose (D10W) 10% bolus 125 mL  12.5 g Intravenous PRN        Or    dextrose 50 % bolus 12.5 g  12.5 g  Intravenous PRN        Or    glucagon (rDNA) (GLUCAGEN) injection 1 mg  1 mg Intramuscular PRN        enoxaparin (LOVENOX) syringe 100 mg  1 mg/kg Subcutaneous Q12H   100 mg at 03/17/22 2315    fluticasone furoate-vilanterol (BREO ELLIPTA) 100-25 MCG/ACT 1 puff  1 puff Inhalation QAM   1 puff at 03/18/22 0741    insulin glargine (LANTUS) injection 40 Units  40 Units Subcutaneous QAM   40 Units at 03/18/22 0808    insulin lispro injection 1-6 Units  1-6 Units Subcutaneous QHS   2 Units at 03/16/22 2128    insulin lispro injection 2-10 Units  2-10 Units Subcutaneous TID AC   2 Units at 03/17/22 1646    lidocaine (XYLOCAINE) 4 % external solution   Topical Q6H PRN   Given at 03/16/22 0404    melatonin tablet 3 mg  3 mg Oral QHS   3 mg at 03/17/22 2106    metoprolol tartrate (LOPRESSOR) tablet 12.5 mg  12.5 mg Oral Q12H SCH   12.5 mg at 03/17/22 1244    miconazole 2 % with zinc oxide (BAZA/SECURA ANTIFUNGAL) cream   Topical BID   Given at 03/17/22 1720    niacin (NIASPAN) CR tablet 500 mg  500 mg Oral BID   500 mg at 03/18/22 0811    ondansetron (ZOFRAN) injection 4 mg  4 mg Intravenous Q8H PRN   4 mg at 03/15/22 1119    PARoxetine (PAXIL) tablet 20 mg  20 mg Oral QAM   20 mg at 03/18/22 0806    polyethylene glycol (MIRALAX) packet 17 g  17 g Oral Daily   17 g at 03/18/22 0811    senna-docusate (PERICOLACE) 8.6-50 MG per tablet 1 tablet  1 tablet Oral QHS   1 tablet at 03/17/22 2106    torsemide (DEMADEX) tablet 20 mg  20 mg Oral Q24H   20 mg at 03/17/22 1714    torsemide (DEMADEX) tablet 40 mg  40 mg Oral Daily               Objective:   Vitals: Vitals reviewed height is 1.829 m (6') and weight is 97.8 kg (215 lb 9.7 oz). His temporal temperature is 97.4 F (36.3 C). His blood pressure  is 97/63 and his pulse is 90. His respiration is 16 and oxygen saturation is 95%. Body mass index is 29.24 kg/m.  Vitals:    03/18/22 0001 03/18/22 0345 03/18/22 0741 03/18/22 0742   BP: 93/57 103/61 97/63    Pulse: 88 83 89 90   Resp:  20 20 16 16    Temp: 98.2 F (36.8 C) 97.3 F (36.3 C) 97.4 F (36.3 C)    TempSrc: Temporal Temporal Temporal    SpO2: 94% 94% 95% 95%   Weight:  97.8 kg (215 lb 9.7 oz)     Height:       Intake and Output Summary (Last 24 hours) at Date Time   Intake/Output Summary (Last 24 hours) at 03/18/2022 0818  Last data filed at 03/18/2022 0407  Gross per 24 hour   Intake 1120 ml   Output 1475 ml   Net -355 ml      Physical Exam:       General: Well appearing, well nourished, in no distress. Oriented x 3, normal mood and affect   Skin: Good turgor, no rash, unusual bruising or prominent lesions  HEENT:  Head: Normocephalic, atraumatic, no visible or palpable masses  Eyes: Visual acuity intact, conjunctiva clear, sclera non-icteric, EOM intact, PERRL, fundi  have normal optic discs and vessels, no exudates or hemorrhages  Ears: EACs clear, TMs translucent & mobile, ossicles nl appearance, hearing intact  Nose: No external lesions, mucosa non-inflamed, septum and turbinates normal  Mouth: Mucous membranes moist, no mucosal lesions  Pharynx: Mucosa non-inflamed, no tonsillar hypertrophy or exudate  Neck: Supple, without lesions, bruits, or adenopathy, thyroid non-enlarged and non-tender  Cardiovascular: RRR, S1, S2, 2/6 SEM, rubs or gallops, left ICD  Respiratory:  CTA b/l, no wheezing, rales or rhonchi  GI:  soft, nontender, nondistended, bowel sounds noted  Extremities: 1+ swelling bilaterally  Musculoskeletal: Normal gait and station. No misalignment, asymmetry, crepitation, defects,  tenderness, masses, effusions, decreased range of motion, instability, atrophy or abnormal  strength or tone in the head, neck, spine, ribs, pelvis or extremities  G/U:  scrotal swelling noted   Neurologic: CN 2-12 normal. Sensation to pain, touch, and proprioception normal. DTRs normal in upper and lower extremities  Psychiatric: Oriented X3, intact recent and remote memory, judgment and insight, normal mood and affect        Results of  Labs/imaging   Labs have been reviewed:   Coagulation Profile:   Recent Labs   Lab 03/18/22  0029   PT 15.6*   PT INR 1.3*   PTT 34          CBC review:   Recent Labs   Lab 03/18/22  0029 03/17/22  0240 03/16/22  0319 03/15/22  0138 03/14/22  0424 03/13/22  0228 03/12/22  0349   WBC 49.06* 43.10* 37.78* 34.07* 30.56*  More results in Results Review 26.09*   Hgb 10.6* 10.2* 11.0* 11.5* 11.9*  More results in Results Review 11.0*   Hematocrit 32.8* 31.8* 33.1* 34.7* 35.5*  More results in Results Review 32.8*   Platelets 207 197 202 186 166  More results in Results Review 155   MCV 104.5* 103.9* 102.5* 102.1* 103.2*  More results in Results Review 102.5*   RDW 14 14 14 14 14   More results in Results Review 14   Segmented Neutrophils 30  --  14 10 18   --  17   More results in Results Review = values in this interval not displayed.  Chem Review:  Recent Labs   Lab 03/18/22  0029 03/17/22  0240 03/16/22  0319 03/15/22  0138 03/14/22  0337 03/12/22  1448 03/12/22  0349   Sodium 135 130* 131* 132* 134*  More results in Results Review 135   Potassium 4.8 4.6 4.6 4.6 4.4  More results in Results Review 4.0   Chloride 102 100 100 101 100  More results in Results Review 100   CO2 23 20 21 20 22   More results in Results Review 21   BUN 52.0* 58.0* 60.0* 58.0* 59.0*  More results in Results Review 51.0*   Creatinine 1.6* 1.6* 1.9* 1.7* 1.9*  More results in Results Review 2.3*   Glucose 192* 202* 238* 288* 206*  More results in Results Review 267*   Calcium 8.0 8.1 8.0 8.1 8.5  More results in Results Review 8.3   Magnesium 2.4 2.5 2.4 2.5 2.4  More results in Results Review 2.2   Phosphorus 3.4 3.4 3.5 3.0 3.6  More results in Results Review 3.6   Bilirubin, Total  --   --   --   --   --   --  1.5*   AST (SGOT)  --   --   --   --   --   --  8   ALT  --   --   --   --   --   --  9   Alkaline Phosphatase  --   --   --   --   --   --  65   More results in Results Review = values in this interval not displayed.      Results        Procedure Component Value Units Date/Time    Glucose Whole Blood - POCT [[id number]]  (Abnormal) Collected: 03/18/22 0735     Updated: 03/18/22 0746     Whole Blood Glucose POCT 166 mg/dL     Manual Differential [[id number]]  (Abnormal) Collected: 03/18/22 0029     Updated: 03/18/22 0124     Segmented Neutrophils 30 %      Band Neutrophils 2 %      Lymphocytes Manual 56 %      Monocytes Manual 0 %      Eosinophils Manual 12 %      Basophils Manual 0 %      Neutrophils Absolute Manual 14.72 x10 3/uL      Band Neutrophils Absolute 0.98 x10 3/uL      Lymphocytes Absolute Manual 27.47 x10 3/uL      Monocytes Absolute 0.00 x10 3/uL      Eosinophils Absolute Manual 5.89 x10 3/uL      Basophils Absolute Manual 0.00 x10 3/uL     Cell MorpHology [[id number]]  (Abnormal) Collected: 03/18/22 0029     Updated: 03/18/22 0124     Cell Morphology Normal     Platelet Estimate Normal     Smudge Cells Present    CBC and differential [[id number]]  (Abnormal) Collected: 03/18/22 0029    Specimen: Blood Updated: 03/18/22 0124     WBC 49.06 x10 3/uL      Hgb 10.6 g/dL      Hematocrit 62.9 %      Platelets 207 x10 3/uL      RBC 3.14 x10 6/uL      MCV 104.5 fL      MCH 33.8 pg      MCHC 32.3 g/dL  RDW 14 %      MPV 11.5 fL      Instrument Absolute Neutrophil Count 5.77 x10 3/uL      Nucleated RBC 0.0 /100 WBC      Absolute NRBC 0.00 x10 3/uL     APTT [[id number]] Collected: 03/18/22 0029     Updated: 03/18/22 0120     PTT 34 sec     Narrative:      If heparin infusion is on MAR hold do not draw this lab    Prothrombin time/INR [[id number]]  (Abnormal) Collected: 03/18/22 0029    Specimen: Blood Updated: 03/18/22 0120     PT 15.6 sec      PT INR 1.3    Narrative:      If heparin infusion is on [facility] hold do not draw this lab    Basic Metabolic Panel [[id number]]  (Abnormal) Collected: 03/18/22 0029    Specimen: Blood Updated: 03/18/22 0101     Glucose 192 mg/dL      BUN 30.1 mg/dL      Creatinine 1.6 mg/dL      Calcium 8.0 mg/dL      Sodium  601 mEq/L      Potassium 4.8 mEq/L      Chloride 102 mEq/L      CO2 23 mEq/L      Anion Gap 10.0     eGFR 44.4 mL/min/1.73 m2     Magnesium [[id number]] Collected: 03/18/22 0029    Specimen: Blood Updated: 03/18/22 0101     Magnesium 2.4 mg/dL     Phosphorus [[id number]] Collected: 03/18/22 0029    Specimen: Blood Updated: 03/18/22 0101     Phosphorus 3.4 mg/dL     Glucose Whole Blood - POCT [[id number]]  (Abnormal) Collected: 03/17/22 2112     Updated: 03/17/22 2116     Whole Blood Glucose POCT 172 mg/dL     Glucose Whole Blood - POCT [[id number]]  (Abnormal) Collected: 03/17/22 1609     Updated: 03/17/22 1612     Whole Blood Glucose POCT 193 mg/dL     Glucose Whole Blood - POCT [[id number]]  (Abnormal) Collected: 03/17/22 1157     Updated: 03/17/22 1204     Whole Blood Glucose POCT 230 mg/dL     Culture Blood Aerobic and Anaerobic [[id number]] Collected: 03/12/22 0058    Specimen: Blood, Venipuncture Updated: 03/17/22 0938    Narrative:      The order will result in two separate 8-62ml bottles  Please do NOT order repeat blood cultures if one has been  drawn within the last 48 hours  UNLESS concerned for  endocarditis  AVOID BLOOD CULTURE DRAWS FROM CENTRAL LINE IF POSSIBLE  Indications:->Other  Other->hypotension  ORDER#: [id number]                                    ORDERED BY: MANZELLA, SALVA  SOURCE: Blood, Venipuncture Left Side                COLLECTED:  03/12/22 00:58  ANTIBIOTICS AT COLL.:                                RECEIVED :  03/12/22 05:54  Culture Blood Aerobic and Anaerobic        FINAL       03/17/22 08:25  03/17/22   No  growth after 5 days of incubation.      Culture Blood Aerobic and Anaerobic [[id number]] Collected: 03/12/22 0058    Specimen: Blood, Venipuncture Updated: 03/17/22 4098    Narrative:      The order will result in two separate 8-63ml bottles  Please do NOT order repeat blood cultures if one has been  drawn within the last 48 hours  UNLESS concerned for  endocarditis  AVOID BLOOD CULTURE  DRAWS FROM CENTRAL LINE IF POSSIBLE  Indications:->Other  Other->hypotension  ORDER#: [id number]                                    ORDERED BY: MANZELLA, SALVA  SOURCE: Blood, Venipuncture RH                       COLLECTED:  03/12/22 00:58  ANTIBIOTICS AT COLL.:                                RECEIVED :  03/12/22 05:54  Culture Blood Aerobic and Anaerobic        FINAL       03/17/22 08:25  03/17/22   No growth after 5 days of incubation.             Radiology reports have been reviewed:  Radiology Results (24 Hour)       Procedure Component Value Units Date/Time    US Venous Duplex Doppler Leg Bilateral [[id number]] Collected: 03/17/22 2241    Order Status: Completed Updated: 03/17/22 2256    Narrative:      HISTORY: Right greater than left leg swelling] pain.     COMPARISON: CT abdomen pelvis 03/11/2022.    TECHNIQUE: Wallace Cullens scale, color flow, and spectral Doppler waveform analysis was performed on the bilateral lower extremity veins described below. There is normal compressibility, phasic flow, and response to augmentation unless otherwise noted.    FINDINGS:   Right lower extremity:    There is occlusive thrombus in the right common femoral vein and and distal right external iliac vein. The right femoral, popliteal, posterior tibial and peroneal veins are patent.    Left lower extremity:    The left common femoral, proximal profunda femoris, proximal greater saphenous vein are patent. The left femoral, popliteal, posterior tibial and peroneal veins are patent.    Redemonstration of enlarged bilateral inguinal lymph nodes, measuring up to 5.8 x 2.0 x 3.8 cm on the left.      Impression:          1.Occlusive thrombus in the right common femoral vein and distal right external iliac vein. Extension of thrombus to the right common iliac vein cannot be assessed by ultrasound. If needed, further evaluation with CT venogram abdomen and pelvis can be   performed.  2.No deep venous thrombosis in the left lower  extremity.  3.Bilateral inguinal lymphadenopathy redemonstrated.    These critical results were discussed with and acknowledged by Dr. Shirline Frees on 03/17/2022 at 10:49 PM.         Nonda Lou, MD  03/17/2022 10:54 PM                  Hospitalist   Signed by: Lurlean Horns, MD   03/18/2022 8:18 AM

## 2022-03-18 NOTE — PT Progress Note (Signed)
Physical Therapy Treatment  Patient: [name] [name]    [id number]  Discharge Recommendations:   Based on today's session: Home with supervision;Home with home health PT     If pt returns home, DME Recommended for Discharge: Front wheel walker;BSC;Shower chair      Unit: [facility] [address] [facility]     Time of Treatment: Start Time: 1324 Stop Time: 1348 Time Calculation (min): 24 min    PT Visit Number: 2    Patient's medical condition is appropriate for Physical Therapy intervention at this time.    Precautions and Contraindications:   Precautions  Weight Bearing Status: no restrictions  Other Precautions: Falls, Scrotol edema    Assessment:   Patient is motivated to participate and progressing in functional mobility with bed mobility, transfer and ambulation. Patient continue to have decrease in activity tolerance, decrease standing balance, impaired gait pattern and decrease LEs strength. Patient has been defer in sitting up in bedside chair at this time due to scrotum pain. Patient education on the importance of sitting up sometime and increase ambulation to improve functional status. Patient understood and agreed upon. Patient has not reached the maximal functional level and skill PT is medically necessary to improve patient's functional mobility to allow patient to return to PLOF.    Assessment: Decreased LE strength;Decreased endurance/activity tolerance;Decreased functional mobility;Decreased balance;Gait impairment Prognosis: Good;With continued PT status post acute discharge   Progress: Progressing toward goals     Patient Goal: "I want to get better."    Goals  Goal Formulation: With patient  Time for Goal Acheivement: By time of discharge  Goals: Select goal  Pt Will Go Supine To Sit: with supervision;to maximize functional mobility and independence;by time of discharge  Pt Will Perform Sit to Stand: with supervision;to maximize functional mobility and independence;by time  of discharge  Pt Will Transfer Bed/Chair: with rolling walker;with supervision;to maximize functional mobility and independence;by time of discharge  Pt Will Ambulate: with rolling walker;51-100 feet;with supervision;to maximize functional mobility and independence;by time of discharge       Plan:   Continue with Physical Therapy services to address functional mobility. Focus next session on bed mobility, transfer and ambulation.  Treatment/Interventions: Exercise;Gait training;Neuromuscular re-education;Functional transfer training;LE strengthening/ROM;Endurance training;Patient/family training;Bed mobility   PT Frequency: 3-4x/wk       Subjective: Patient is agreeable to participation in the therapy session. Nursing clears patient for therapy.   Pain Assessment  Pain Assessment: Numeric Scale (0-10)  Pain Score: 3-mild pain  POSS Score: Awake and Alert  Pain Location: Scrotum     Objective:  Observation of Patient/Vital Signs:  Patient is in bed with telemetry in place.    Cognition/Neuro Status  Arousal/Alertness: Appropriate responses to stimuli  Attention Span: Appears intact  Orientation Level: Oriented X4  Memory: Appears intact  Following Commands: Follows all commands and directions without difficulty  Safety Awareness: independent  Insights: Educated in Engineer, building services  Problem Solving: Able to problem solve independently  Behavior: calm;cooperative;attentive  Motor Planning: intact  Coordination: intact  Hand Dominance: right handed    Functional Mobility  Rolling: Supervision  Supine to Sit: Stand by Assist  Scooting to EOB: Stand by Assist  Sit to Supine: Stand by Assist  Sit to Stand: Stand by Assist  Stand to Sit: Stand by Assist  Transfers  Bed to Chair: Stand by Assist  Chair to Bed: Stand by Assist  Stand Pivot Transfers: Stand by Assist  Device Used for Functional Transfer: front-wheeled  walker  Locomotion  Ambulation: Contact Guard Assist;with front-wheeled walker  Pattern: decreased  cadence;decreased step length;Step to  [employer] (ft) (Step 6,7): 85 Feet       Therapeutic Exercise  Quad Sets: in supine AROM ex B/L LEs x 10 reps  Heelslides: in supine AROM ex B/L LEs x 10 reps (pt c/o pain to R side due ot scrotum pain)  Glute Sets: in supine AROM ex B/L LEs x 10 reps  Knee AROM : in supine AROM ex B/L LEs x 10 reps  Ankle Pumps: in supine AROM ex B/L LEs x 10 reps  Therex with verbal and tactile instruction for good form of ex with full ROM and isolated movement. Patient also encourage to perform purse lip breathing and pacing with performing exercises to decrease SOB.          Neuro Re-Ed  Neuro Re-ed/Motor Control: standing;contact guard assist;stand by assist  Standing Balance: standing weight shifting all planes;dynamic gait training;with instruction;with support;stand by assist;contact guard assist  NM re-ed with facilitating static and dynamic standing balance training including techniques to facilitate proprioception with RW, facilitation of weight shift/dynamic stability, facilitation of anticipatory postural adjustments with dynamic gait training, adjustment of center of mass over base of support, facilitation of symmetrical stance and facilitation of lateral trunk balance/stability to decrease risk for fall.       AM-PACT "6 Clicks" Basic Mobility Inpatient Short Form  Turning Over in Bed: None  Sitting Down On/Standing From Armchair: A little  Lying on Back to Sitting on Side of Bed: None  Assist Moving to/from Bed to Chair: A little  Assist to Walk in Hospital Room: A little  Assist to Climb 3-5 Steps with Railing: A lot  PT Basic Mobility Raw Score: 19  CMS 0-100% Score: 41.77%    Treatment Activities:   Patient participated in PT eval and tx. Educated the patient to role of Physical therapy, plan of care, goals of therapy and safety with mobility with use of RW. Therapeutic activity with verbal instruction provided for all above functional mobility with facilitation of  correct postural alignment ensuring upright posture with shoulder and hip alignment. Patient reinforcement on pushing up from arm rest when standing to increase safety. Educated patient on the importance of coming to a complete stand and establishing posture prior to attempting ambulation or transfers. When pt is ready for stand to sit in chair. Recommend pt to bring RW with him and turn all the way with feeling legs touching chair and reach back prior to sitting down. Facilitated lateral weight shifting through hip and pelvis to facilitate natural postural adjustments during gait. Facilitated patient on negotiate obstacles as well as safety precaution and energy conservation during ambulation.     Patient fatigue quickly and unable to ambulate further. Educated patient in pursed lip breathing technique with focusing on taking deep breath in through their nose and exhaling through their mouth. Instructed patient on full inhale for a count of 3-5 and a full exhale for 5-8 seconds . Instructed patient on the importance of pushing carbon dioxide from lung bases which will allow for increase oxygen intake. Advised patient to continue with pursed lip breathing until shortness of breath or decreased oxygen saturations resolves. Instructed in proper use of spirometer(10 times per hour or 2-3 times every 6-10 minutes)  to help maximize pulmonary hygiene.    Educated the patient to role of physical therapy, plan of care, goals of therapy and safety with mobility and ADLs.  Patient left without needs and call bell within reach. Bed Alarm set.  RN notified of session outcome.        Therapist PPE during session procedural mask and gloves    Dona Ana, DPT  [facility]   Physical Medicine and Rehabilitation Dept

## 2022-03-18 NOTE — Progress Notes (Signed)
Infectious Disease            Progress Note    03/18/2022   [name] [name] ZOX:[id number] is a 76 y.o. male, with history significant for diabetes mellitus, hypertension, chronic kidney disease, anxiety, depression, sleep apnea, COPD, hyperlipidemia, congestive heart failure, nonischemic cardiomyopathy, status post AICD placement, CAD with leukocytosis, lymphadenopathy.    Subjective:     [name] [name] today Symptoms: Afebrile, still significant scrotal swelling, no new complaints, no vomiting or diarrhea, pain under control. Other review of system is non contributory.    Objective:     Blood pressure 97/63, pulse 90, temperature 97.4 F (36.3 C), temperature source Temporal, resp. rate 16, height 1.829 m (6'), weight 97.8 kg (215 lb 9.7 oz), SpO2 95 %.    General Appearance: Looks comfortable  HEENT: Pallor negative, Anicteric sclera.    Neck:    Supple  Lungs: Decreased breath sound at bases.    Chest Wall: Symmetric chest wall expansion.   Heart : S1 and S2.   Abdomen: Abdomen is soft, bowel sounds positive.  Neurological: Alert and oriented to person, place and time.  Moves all extremities  Extremities: Lower extremity edema  Skin/scrotum: Scrotal swelling and tenderness  Psychiatric: Mood and affect is normal    Laboratory And Diagnostic Studies:     Recent Labs     03/18/22  0029 03/17/22  0240 03/16/22  0319   WBC 49.06* 43.10* 37.78*   Hgb 10.6* 10.2* 11.0*   Hematocrit 32.8* 31.8* 33.1*   Platelets 207 197 202   Segmented Neutrophils 30  --  14       Recent Labs     03/18/22  0029 03/17/22  0240   Sodium 135 130*   Potassium 4.8 4.6   Chloride 102 100   CO2 23 20   BUN 52.0* 58.0*   Creatinine 1.6* 1.6*   Glucose 192* 202*   Calcium 8.0 8.1         Current Med's:     Current Facility-Administered Medications   Medication Dose Route Frequency    acetaminophen  650 mg Oral Q6H    amoxicillin-clavulanate  1 tablet Oral Q12H [facility]    aspirin EC  81 mg Oral Daily    atorvastatin  40 mg Oral  QHS    enoxaparin  1 mg/kg Subcutaneous Q12H    fluticasone furoate-vilanterol  1 puff Inhalation QAM    insulin glargine  40 Units Subcutaneous QAM    insulin lispro  1-6 Units Subcutaneous QHS    insulin lispro  2-10 Units Subcutaneous TID AC    melatonin  3 mg Oral QHS    metoprolol tartrate  12.5 mg Oral Q12H [facility]    miconazole 2 % with zinc oxide   Topical BID    niacin  500 mg Oral BID    PARoxetine  20 mg Oral QAM    polyethylene glycol  17 g Oral Daily    senna-docusate  1 tablet Oral QHS    torsemide  20 mg Oral Q24H    torsemide  40 mg Oral Daily       Lines/Drains:     Patient Lines/Drains/Airways Status       Active Lines, Drains and Airways       Name Placement date Placement time Site Days    Peripheral IV 03/12/22 22 G Left;Posterior Forearm 03/12/22  0100  Forearm  6    Peripheral IV 03/14/22 20 G Anterior;Distal;Right Forearm 03/14/22  0400  Forearm  4                    Assessment:     Condition: Guarded  Lower extremity cellulitis  Retroperitoneal, inguinal, mesenteric lymphadenopathy  CLL  Congestive heart failure  Cardiomyopathy  S/p ICD placement  Diabetes mellitus  Left hydrocele  Chronic kidney disease  Hypertension  Coronary artery disease  Chronic obstructive pulmonary disease  Hyperlipidemia  Sleep apnea  Depression    Plan:     Continue Augmentin  Hematology follow-up  Cardiology follow-up  Correction of electrolytes  Urology follow-up  Physical therapy  Discussed with treatment team          Alfonzo Beers, MD, M.D.,FACP  03/18/2022  8:44 AM          *This note was generated by the Epic EMR system/ Dragon speech recognition and may contain inherent errors or omissions not intended by the user. Grammatical errors, random word insertions, deletions, pronoun errors and incomplete sentences are occasional consequences of this technology due to software limitations. Not all errors are caught or corrected. If there are questions or concerns about the content of this note or information  contained within the body of this dictation they should be addressed directly with the author for clarification

## 2022-03-18 NOTE — Consults (Signed)
[place] [facility]   ADVANCED HEART FAILURE CONSULTATION REPORT  [facility]    Date Time: 03/18/22 9:51 AM  Patient Name: [name] [name]  Requesting Physician: Lurlean Horns, MD       Reason for Consultation:   CHF    History:   [name] [name] is a 76 y.o. male admitted on 03/09/2022.  We have been asked by Lurlean Horns, MD,  to provide cardiac consultation, regarding CHF.      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  For HF GDMT, he is on carvedilol 25 mg twice daily, Entresto 97/23 mg twice daily, Jardiance 10 mg daily, and spironolactone 12.5 mg daily in the outpatient setting.  He has a Medtronic ICD in place.    He was initially seen by urology couple weeks prior for severe scrotal edema.  He also mentioned that he had lower extremity edema as well.  Eventually was admitted for worsening swelling and shortness of breath.  He has been treated with IV Lasix 80 mg twice daily dosing.    Chest x-ray showed no acute cardiopulmonary process.  He had an ultrasound of the scrotum and testicles, showed moderate size, left high steel scrotal wall edema.  Past couple days, he has noticed improvement in his breathing and lower extremity edema.  However, he still has some lingering scrotal edema that is not quite back to baseline.    Seen by urology, who did not recommend surgery at this time.  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  He sees urologist Dr. Edwyna Shell in the outpatient setting.  He was also found to have right lower extremity DVT. Now on lovenox. ID following for LLE cellulitis.       Past Medical History:     Past Medical History:   Diagnosis Date    Acute CHF     NOV  & DEC 2018, - DIALYSIS CATH PLACED Aug 24 2017 TO REMOVE FLUID     Acute systolic (congestive) heart failure 08/2017    Anxiety     CAD (coronary artery disease)     Cardiomyopathy     nonischemic    CHF (congestive heart failure) 08/12/2014,  2013    Chronic obstructive pulmonary disease     POSSIBLE PER PT HE USES  SYNBICORT BID ABD  BREO INHALER PRN    Coronary artery disease 2011    CORONARY STENT PLACEMENT FOLLOWED BY [facility]    Depression     echocardiogram 02/2016, 11/2016, 09/2017, 12/20/2017    GERD (gastroesophageal reflux disease)     Heart attack 2011    and 2014    Hyperlipemia     Hypertension     ICD (implantable 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     PLACED 2014  [facility]  LAST INTERROGATION April 01 2018 REPORT REQUESTED    Ischemic cardiomyopathy     EF 15% ON ECHO 09-20-2017 DR GARG IN EPIC    Nonischemic cardiomyopathy     NSTEMI (non-ST elevated myocardial infarction)     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    Pacemaker 2014    MEDTRONIC ICD/PACEMAKER COMBO LAST INTERROGATION  12-12 2018    Pneumonia 09/2016    Primary cardiomyopathy     Ischemic cardiomyopathy EF 15% ON ECHO 09-20-2017 DR GARG IN EPIC    Sepsis 08/2017    Syncope and collapse     PRIOR TO ICD/PACEMAKER PLACED 2014    Type 2  diabetes mellitus, controlled DX 1998    BS  AVG 100  A1C 7.4 Dec 27 2017    [place]     PT SEE HIS PMD April 01 2018 RE THIS-TOLD TO SEE PMD BY [facility] JUNE 12 WHEN THEY SAW HIM FOR A CL       Past Surgical History:     Past Surgical History:   Procedure Laterality Date    BIV  11/10/2016    ICD METRONIC  UPGRADE     CARDIAC CATHETERIZATION  03/2010    CORONARY STENT PLACEMENT X 2 PER PT; LM normal, LAD 70-80% distal lesion at apex, 20% lesion mid CFX    CARDIAC CATHETERIZATION  12/2012    CARDIAC DEFIBRILLATOR PLACEMENT  12/2012    [facility]    CARDIAC PACEMAKER PLACEMENT  2014    MEDTRONIC ICD/PACEMAKER PLACED [facility]    CIRCUMCISION  AGE 49    COLONOSCOPY  2009    CORRECTION HAMMER TOE      duodenal ulcer  1973    STRESS RELATED IN SCHOOL    ECHOCARDIOGRAM, TRANSTHORACIC  02/2016 11/2016 09/2017 12/2017    ECHOCARDIOGRAM, TRANSTHORACIC  02/2016,11/2016,12/20/2017    EGD N/A 08/15/2014    Procedure: EGD;  Surgeon: Colon Branch, MD;  Location: [facility];  Service: Gastroenterology;  Laterality: N/A;  egd w/ bx    EGD  1975    EGD, COLONOSCOPY N/A 04/04/2018    Procedure: EGD with bxs, COLONOSCOPY with polypectomy and clipping;  Surgeon: Doyne Keel, MD;  Location: [facility];  Service: Gastroenterology;  Laterality: N/A;    FRACTURE SURGERY  AGE 89     RT  ANKLE- CAST APPLIED    HERNIA REPAIR  AGE 49    LEFT INGUINAL HERNIA    MPI nuclear study  06/2016    orthopedic surgery  AGE 71    RT foot corrective - HAMMERTOE    TONSILLECTOMY AND ADENOIDECTOMY  1954       Family History:     Family History   Problem Relation Age of Onset    Breast cancer Mother     Heart attack Mother 65    Hypertension Mother     Diabetes Mother     Coronary artery disease Mother     Diabetes Brother     Heart attack Father     Hypertension Father        Social History:     Social History     Socioeconomic History    Marital status: Married     Spouse name: Teacher, music    Number of children: 0    Years of education: Not on file    Highest education level: Not on file   Occupational History    Occupation: Runner, 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, [place]    Tobacco Use    Smoking status: Never    Smokeless tobacco: Never   Vaping Use    Vaping status: Never Used   Substance and Sexual Activity    Alcohol use: No     Alcohol/week: 0.0 standard drinks of alcohol     Comment: occasionally    Drug use: No    Sexual activity: Yes     Partners: Female   Other Topics Concern    Not on file   Social History Narrative    Not on file     Social Determinants of Health     Financial Resource Strain: Not on file  Food Insecurity: Not on file   Transportation Needs: Not on file   Physical Activity: Not on file   Stress: Not on file   Social Connections: Not on file   Intimate Partner Violence: Not on file   Housing Stability: Not on file       Allergies:   No Known Allergies    Medications:     Medications Prior to Admission   Medication Sig    aspirin EC 81 MG EC tablet Take 1 tablet (81 mg) by mouth daily     atorvastatin (LIPITOR) 40 MG tablet Take 1 tablet (40 mg total) by mouth nightly    carvedilol (COREG) 25 MG tablet Take 1 tablet (25 mg total) by mouth 2 (two) times daily with meals    empagliflozin (JARDIANCE) 10 MG tablet Take 1 tablet (10 mg total) by mouth daily    fluticasone-salmeterol (ADVAIR DISKUS) 100-50 MCG/ACT Aerosol Pwdr, Breath Activated Inhale 1 puff into the lungs 2 (two) times daily    glucose blood (ONE TOUCH ULTRA TEST) test strip 1 each by Other route 2 (two) times daily Use as instructed    hydrocodone-chlorpheniramine (TUSSIONEX) 10-8 MG/5ML extended-release suspension Take 5 mLs by mouth 2 (two) times daily    insulin glargine (LANTUS SOLOSTAR) 100 UNIT/ML injection pen Patient takes 50 units at night    Insulin Pen Needle (BD Pen Needle Nano U/F) 32G X 4 MM Misc Use as directed bid    metOLazone (ZAROXOLYN) 2.5 MG tablet Take 1 tablet (2.5 mg total) by mouth once a week    niacin (NIASPAN) 500 MG CR tablet TAKE 1 TABLET BY MOUTH TWICE DAILY    PARoxetine (Paxil) 20 MG tablet Take 1 tablet (20 mg total) by mouth every morning    sacubitril-valsartan (Entresto) 97-103 MG Tab per tablet Take 1 tablet by mouth 2 (two) times daily    Semglee, yfgn, 100 UNIT/ML Solution Pen-injector ADMINISTER 50 UNITS UNDER THE SKIN AT BEDTIME    SITagliptin-metFORMIN (Janumet) 50-500 MG tablet Take 1 tablet by mouth 2 (two) times daily with meals    spironolactone (ALDACTONE) 25 MG tablet Take 0.5 tablets (12.5 mg total) by mouth daily    torsemide (Demadex) 20 MG tablet Take 2 tablets (40 mg) by mouth every morning AND 1 tablet (20 mg) every evening.         Current Facility-Administered Medications   Medication Dose Route Frequency Provider Last Rate Last Admin    acetaminophen (TYLENOL) tablet 650 mg  650 mg Oral Q6H Blue, Ryann M, NP   650 mg at 03/18/22 0553    amoxicillin-clavulanate (AUGMENTIN) 500-125 MG per tablet 1 tablet  1 tablet Oral Q12H [facility] Choudhary, Sarfraz A, MD   1 tablet at 03/18/22 3567     aspirin EC tablet 81 mg  81 mg Oral Daily Caffie Damme, MD   81 mg at 03/18/22 0806    atorvastatin (LIPITOR) tablet 40 mg  40 mg Oral QHS Caffie Damme, MD   40 mg at 03/17/22 2106    dextrose (GLUCOSE) 40 % oral gel 15 g of glucose  15 g of glucose Oral PRN Cheema, Saima, MD        Or    dextrose (D10W) 10% bolus 125 mL  12.5 g Intravenous PRN Cheema, Saima, MD        Or    dextrose 50 % bolus 12.5 g  12.5 g Intravenous PRN Caffie Damme, MD        Or  glucagon (rDNA) (GLUCAGEN) injection 1 mg  1 mg Intramuscular PRN Caffie Dammeheema, Saima, MD        enoxaparin (LOVENOX) syringe 100 mg  1 mg/kg Subcutaneous Q12H Cherlynn Perchesahman, Mohammad A, MD   100 mg at 03/17/22 2315    fluticasone furoate-vilanterol (BREO ELLIPTA) 100-25 MCG/ACT 1 puff  1 puff Inhalation QAM Caffie Dammeheema, Saima, MD   1 puff at 03/18/22 0741    insulin glargine (LANTUS) injection 40 Units  40 Units Subcutaneous QAM Allegra GranaMurabia, Arman, MD   40 Units at 03/18/22 0808    insulin lispro injection 1-6 Units  1-6 Units Subcutaneous QHS Caffie Dammeheema, Saima, MD   2 Units at 03/16/22 2128    insulin lispro injection 2-10 Units  2-10 Units Subcutaneous TID Remigio EisenmengerAC Cheema, Saima, MD   2 Units at 03/17/22 1646    lidocaine (XYLOCAINE) 4 % external solution   Topical Q6H PRN Caffie Dammeheema, Saima, MD   Given at 03/16/22 0404    melatonin tablet 3 mg  3 mg Oral QHS Blue, Ryann M, NP   3 mg at 03/17/22 2106    metoprolol succinate XL (TOPROL-XL) 24 hr tablet 12.5 mg  12.5 mg Oral Daily Marianna FussWelch, Sevyn Markham S, MD        miconazole 2 % with zinc oxide (BAZA/SECURA ANTIFUNGAL) cream   Topical BID Lubertha BasqueSeverino, Alyson R, NP   Given at 03/17/22 1720    niacin (NIASPAN) CR tablet 500 mg  500 mg Oral BID Caffie Dammeheema, Saima, MD   500 mg at 03/18/22 0811    ondansetron (ZOFRAN) injection 4 mg  4 mg Intravenous Q8H PRN Blue, Ryann M, NP   4 mg at 03/15/22 1119    PARoxetine (PAXIL) tablet 20 mg  20 mg Oral QAM Cheema, Ladell HeadsSaima, MD   20 mg at 03/18/22 0806    polyethylene glycol (MIRALAX) packet 17 g  17 g Oral Daily Caffie Dammeheema, Saima,  MD   17 g at 03/18/22 [id number]    senna-docusate (PERICOLACE) 8.6-50 MG per tablet 1 tablet  1 tablet Oral QHS Caffie Dammeheema, Saima, MD   1 tablet at 03/17/22 2106    spironolactone (ALDACTONE) tablet 12.5 mg  12.5 mg Oral Daily Marianna FussWelch, Jovonta Levit S, MD        torsemide [facility](DEMADEX) tablet 20 mg  20 mg Oral Q24H Marianna FussWelch, Scotti Kosta S, MD   20 mg at 03/17/22 1714    torsemide (DEMADEX) tablet 40 mg  40 mg Oral Daily Marianna FussWelch, Ladana Chavero S, MD             Review of Systems:    Comprehensive review of systems including constitutional, eyes, ears, nose, mouth, throat, cardiovascular, GI, GU, musculoskeletal, integumentary, respiratory, neurologic, psychiatric, and endocrine is negative other than what is mentioned already in the history of present illness    Physical Exam:     Vitals:    03/18/22 0742   BP:    Pulse: 90   Resp: 16   Temp:    SpO2: 95%     Temp (24hrs), Avg:97.8 F (36.6 C), Min:97.3 F (36.3 C), Max:98.4 F (36.9 C)      Intake and Output Summary (Last 24 hours) at Date Time    Intake/Output Summary (Last 24 hours) at 03/18/2022 0951  Last data filed at 03/18/2022 0850  Gross per 24 hour   Intake 565 ml   Output 1575 ml   Net -1010 ml       GENERAL: Patient is in no acute distress   HEENT: Moist mucous membranes  NECK: No jugular venous distention  CARDIAC: Regular rate and rhythm, with normal S1 and S2, and no murmurs,  CHEST: Clear to auscultation bilaterally, normal respiratory effort  ABDOMEN: Soft, nontender, non-distended, good bowel sounds   EXTREMITIES: RLE +1 edema, +scrotal edema, 2+ radial pulses palpable   SKIN: No rash or jaundice   NEUROLOGIC: Alert and oriented to time, place and person, normal mood and affect    MUSCULOSKELETAL: Normal muscle strength and tone.      Labs Reviewed:     Recent Labs   Lab 03/11/22  2325   hs Troponin-I 14.5             Recent Labs   Lab 03/12/22  0349   Bilirubin, Total 1.5*   Protein, Total 5.4*   Albumin 2.8*   ALT 9   AST (SGOT) 8     Recent Labs   Lab 03/18/22  0029   Magnesium  2.4     Recent Labs   Lab 03/18/22  0029   PT 15.6*   PT INR 1.3*   PTT 34     Recent Labs   Lab 03/18/22  0029 03/17/22  0240 03/16/22  0319   WBC 49.06* 43.10* 37.78*   Hgb 10.6* 10.2* 11.0*   Hematocrit 32.8* 31.8* 33.1*   Platelets 207 197 202     Recent Labs   Lab 03/18/22  0029 03/17/22  0240 03/16/22  0319   Sodium 135 130* 131*   Potassium 4.8 4.6 4.6   Chloride 102 100 100   CO2 23 20 21    BUN 52.0* 58.0* 60.0*   Creatinine 1.6* 1.6* 1.9*   eGFR 44.4* 44.4* 36.0*   Glucose 192* 202* 238*   Calcium 8.0 8.1 8.0         Radiology   Radiological Procedure reviewed.      chest X-ray  Assessment:   Recent admission for acute on chronic systolic CHF and scrotal edema  EF 25 to 30% by echo August 2022.  Moderate tricuspid and mitral regurgitation present.  PA pressures were 54 mmHg.   Nonischemic cardiomyopathy with EF as low as 15% May 2021, now up to 25 to 30% by echo August 2022  Negative cardiac catheterization for coronary disease 2014  Medtronic ICD in place  Diabetes  Hypertension      Recommendations:     Ok to resume Torsemide 40mg  in AM and 20mg  in afternoon for SBP >90.   Reduce Toprol XL to 12.5mg . Holding paramterse updated  Resume Spironolactone 12.5mg  once daily today. Hold for SBP ><90.   Will hold off on Coreg and Entresto for now in light of hypotension   Discussed above plan with bedside RN and Dr. Thedore Mins       Signed by: Conception Oms, NP    -----------    GENERAL: Patient is in no acute distress   HEENT: Moist mucous membranes   NECK: No jugular venous distention  CARDIAC: Regular rate and rhythm, with normal S1 and S2, and no murmurs,  CHEST: Clear to auscultation bilaterally, normal respiratory effort  ABDOMEN: Soft, nontender, non-distended, good bowel sounds   EXTREMITIES: RLE +1 edema, +scrotal edema, 2+ radial pulses palpable   SKIN: No rash or jaundice   NEUROLOGIC: Alert and oriented to time, place and person, normal mood and affect    MUSCULOSKELETAL: Normal muscle strength and tone.    I  have personally interviewed and examined the patient.  I have reviewed the provider's  history, exam, assessment and management plans. I concur with or have edited all elements of the provider's note.      Jarrett Soho, MD [facility]  Advanced HF and Transplant Consult Attending    Greater than 30 minutes were spent in preparation and face to face with the patient counseling regarding, but not limited to, the results and implications of diagnostic testing, potential additional testing options, explaining the current diagnoses, educating the patient on proper management of NICM including adherence, and discussing prognosis, as well as with coordination of care.

## 2022-03-18 NOTE — Plan of Care (Signed)
Problem: Everyday - Heart Failure  Goal: Stable Vital Signs and Fluid Balance  Outcome: Progressing  Flowsheets (Taken 03/17/2022 2347 by Adaline Sill, RN)  Stable Vital Signs and Fluid Balance:   Daily Standing Weights in the morning using the same scale, after using the bathroom and before breadfast.  If unable to stand, zero the bed and use the bed scale   Monitor, assess vital signs and telemetry per policy   Strict Intake/Output   Monitor labs and report abnormalities to physician   Assess for swelling/edema  Goal: Mobility/Activity is Maintained at Optimal Level for Patient  Outcome: Progressing  Flowsheets (Taken 03/17/2022 2347 by Adaline Sill, RN)  Mobility/Activity is Maintained at Optimal Level for Patient:   Increase mobility as tolerated/progressive mobility protocol   Reposition patient every 2 hours and as needed unless able to reposition self   Assess for changes in respiratory status, level of consciousness and/or development of fatigue  Goal: Nutritional Intake is Adequate  Outcome: Progressing  Flowsheets (Taken 03/17/2022 2347 by Adaline Sill, RN)  Nutritional Intake is Adequate:   Consult/Collaborate with Nutritionist   Assess appetite,anorexia and amount of meal/food tolerated  Goal: Teaching-Using CHF Warning Zones and Educational Videos  Outcome: Progressing  Flowsheets (Taken 03/17/2022 2347 by Adaline Sill, RN)  Teaching-Using CHF Warning Zones and Educational Videos:   Signs & Symptoms of CHF   Daily Standing Weights & record   CHF Warning Zones and when to call for help     Problem: Pain  Goal: Pain at adequate level as identified by patient  Outcome: Progressing  Flowsheets (Taken 03/17/2022 0915 by Cherly Hensen, RN)  Pain at adequate level as identified by patient:   Identify patient comfort function goal   Assess for risk of opioid induced respiratory depression, including snoring/sleep apnea. Alert healthcare team of risk factors identified.   Assess pain on admission, during  daily assessment and/or before any "as needed" intervention(s)   Reassess pain within 30-60 minutes of any procedure/intervention, per Pain Assessment, Intervention, Reassessment (AIR) Cycle   Evaluate if patient comfort function goal is met   Offer non-pharmacological pain management interventions   Consult/collaborate with Physical Therapy, Occupational Therapy, and/or Speech Therapy   Include patient/patient care companion in decisions related to pain management as needed   Consult/collaborate with Pain Service   Evaluate patient's satisfaction with pain management progress     Problem: Psychosocial and Spiritual Needs  Goal: Demonstrates ability to cope with hospitalization/illness  Outcome: Progressing  Flowsheets (Taken 03/11/2022 1034 by Theodoro Doing, RN)  Demonstrates ability to cope with hospitalizations/illness:   Encourage verbalization of feelings/concerns/expectations   Assist patient to identify own strengths and abilities   Provide quiet environment   Encourage patient to set small goals for self   Reinforce positive adaptation of new coping behaviors   Encourage participation in diversional activity   Include patient/ patient care companion in decisions

## 2022-03-18 NOTE — Progress Notes (Signed)
Date Time: 03/18/22 10:19 AM  Patient Name: [name] [name]  Requesting Physician: Lurlean Horns, MD    Recommendations     #CLL, RAI stage 0  -Not on current therapy, which is not indicated at this time  -Leukocytosis secondary to CLL, patient's white blood cell count is at baseline compared to office studies  -Imaging reviewed, lymphadenopathy consistent with CLL    #acute DVT, Right proximal  -[place]: Occlusive thrombus in the right common femoral vein and distal right external iliac vein. Extension of thrombus to the right common iliac vein cannot be assessed by ultrasound  -Started on treatment dose Lovenox    #Scrotal edema  -Urology following  -Secondary to CHF, lymphadenopathy     Dr Laurine Blazer rounding, await further recommendations     Assessment   Stefan Shivank Pinedo is a 76 y.o. male who presented to the hospital on 03/09/2022.    Principal Problem:    Acute on chronic congestive heart failure, unspecified heart failure type  Active Problems:    Scrotal edema    Hydrocele, unspecified hydrocele type    24 hr events noted    Subjective   No new complaints offered    Physical Examination     Vitals:    03/18/22 0742   BP:    Pulse: 90   Resp: 16   Temp:    SpO2: 95%     GENERAL:  Alert.  No acute distress.  CHEST:  unlabored  HEART:  Regular rate and rhythm.   EXTREMITIES: + edema.    Medications     Current Facility-Administered Medications   Medication Dose Route Frequency    acetaminophen  650 mg Oral Q6H    amoxicillin-clavulanate  1 tablet Oral Q12H [facility]    aspirin EC  81 mg Oral Daily    atorvastatin  40 mg Oral QHS    enoxaparin  1 mg/kg Subcutaneous Q12H    fluticasone furoate-vilanterol  1 puff Inhalation QAM    insulin glargine  40 Units Subcutaneous QAM    insulin lispro  1-6 Units Subcutaneous QHS    insulin lispro  2-10 Units Subcutaneous TID AC    melatonin  3 mg Oral QHS    metoprolol succinate XL  12.5 mg Oral Daily    miconazole 2 % with zinc oxide   Topical BID    niacin  500 mg Oral  BID    PARoxetine  20 mg Oral QAM    polyethylene glycol  17 g Oral Daily    senna-docusate  1 tablet Oral QHS    spironolactone  12.5 mg Oral Daily    torsemide  20 mg Oral Q24H    torsemide  40 mg Oral Daily       Data     Recent Labs   Lab 03/18/22  0029 03/17/22  0240 03/16/22  0319 03/15/22  0138 03/14/22  0424 03/13/22  0228 03/12/22  0349   WBC 49.06* 43.10* 37.78* 34.07* 30.56*  More results in Results Review 26.09*   Hgb 10.6* 10.2* 11.0* 11.5* 11.9*  More results in Results Review 11.0*   Hematocrit 32.8* 31.8* 33.1* 34.7* 35.5*  More results in Results Review 32.8*   Platelets 207 197 202 186 166  More results in Results Review 155   MCV 104.5* 103.9* 102.5* 102.1* 103.2*  More results in Results Review 102.5*   RDW 14 14 14 14 14   More results in Results Review 14   Segmented Neutrophils 30  --  14 10 18   --  17   More results in Results Review = values in this interval not displayed.       Recent Labs   Lab 03/18/22  0029 03/17/22  0240 03/16/22  0319 03/15/22  0138 03/14/22  0337 03/12/22  1448 03/12/22  0349   Sodium 135 130* 131* 132* 134*  More results in Results Review 135   Potassium 4.8 4.6 4.6 4.6 4.4  More results in Results Review 4.0   Chloride 102 100 100 101 100  More results in Results Review 100   CO2 23 20 21 20 22   More results in Results Review 21   BUN 52.0* 58.0* 60.0* 58.0* 59.0*  More results in Results Review 51.0*   Creatinine 1.6* 1.6* 1.9* 1.7* 1.9*  More results in Results Review 2.3*   Glucose 192* 202* 238* 288* 206*  More results in Results Review 267*   Calcium 8.0 8.1 8.0 8.1 8.5  More results in Results Review 8.3   Magnesium 2.4 2.5 2.4 2.5 2.4  More results in Results Review 2.2   Phosphorus 3.4 3.4 3.5 3.0 3.6  More results in Results Review 3.6   Bilirubin, Total  --   --   --   --   --   --  1.5*   AST (SGOT)  --   --   --   --   --   --  8   ALT  --   --   --   --   --   --  9   Alkaline Phosphatase  --   --   --   --   --   --  65   More results in Results Review  = values in this interval not displayed.       Recent Labs   Lab 03/18/22  0029   PTT 34   PT INR 1.3*       Radiology Results (24 Hour)       Procedure Component Value Units Date/Time    US Venous Duplex Doppler Leg Bilateral [[id number]] Collected: 03/17/22 2241    Order Status: Completed Updated: 03/17/22 2256    Narrative:      HISTORY: Right greater than left leg swelling] pain.     COMPARISON: CT abdomen pelvis 03/11/2022.    TECHNIQUE: Wallace Cullens scale, color flow, and spectral Doppler waveform analysis was performed on the bilateral lower extremity veins described below. There is normal compressibility, phasic flow, and response to augmentation unless otherwise noted.    FINDINGS:   Right lower extremity:    There is occlusive thrombus in the right common femoral vein and and distal right external iliac vein. The right femoral, popliteal, posterior tibial and peroneal veins are patent.    Left lower extremity:    The left common femoral, proximal profunda femoris, proximal greater saphenous vein are patent. The left femoral, popliteal, posterior tibial and peroneal veins are patent.    Redemonstration of enlarged bilateral inguinal lymph nodes, measuring up to 5.8 x 2.0 x 3.8 cm on the left.      Impression:          1.Occlusive thrombus in the right common femoral vein and distal right external iliac vein. Extension of thrombus to the right common iliac vein cannot be assessed by ultrasound. If needed, further evaluation with CT venogram abdomen and pelvis can be   performed.  2.No deep venous thrombosis in the left lower extremity.  3.Bilateral inguinal lymphadenopathy redemonstrated.    These critical results were discussed with and acknowledged by Dr. Shirline Frees on 03/17/2022 at 10:49 PM.         Nonda Lou, MD  03/17/2022 10:54 PM              [facility]   [address]  [place], [place] [address]  [phone]      [email or web address]

## 2022-03-18 NOTE — Plan of Care (Signed)
Problem: Everyday - Heart Failure  Goal: Stable Vital Signs and Fluid Balance  Outcome: Progressing  Flowsheets (Taken 03/17/2022 2347)  Stable Vital Signs and Fluid Balance:   Daily Standing Weights in the morning using the same scale, after using the bathroom and before breadfast.  If unable to stand, zero the bed and use the bed scale   Monitor, assess vital signs and telemetry per policy   Strict Intake/Output   Monitor labs and report abnormalities to physician   Assess for swelling/edema  Goal: Mobility/Activity is Maintained at Optimal Level for Patient  Outcome: Progressing  Flowsheets (Taken 03/17/2022 2347)  Mobility/Activity is Maintained at Optimal Level for Patient:   Increase mobility as tolerated/progressive mobility protocol   Reposition patient every 2 hours and as needed unless able to reposition self   Assess for changes in respiratory status, level of consciousness and/or development of fatigue  Goal: Nutritional Intake is Adequate  Outcome: Progressing  Flowsheets (Taken 03/17/2022 2347)  Nutritional Intake is Adequate:   Consult/Collaborate with Nutritionist   Assess appetite,anorexia and amount of meal/food tolerated  Goal: Teaching-Using CHF Warning Zones and Educational Videos  Outcome: Progressing  Flowsheets (Taken 03/17/2022 2347)  Teaching-Using CHF Warning Zones and Educational Videos:   Signs & Symptoms of CHF   Daily Standing Weights & record   CHF Warning Zones and when to call for help     Problem: Safety  Goal: Patient will be free from injury during hospitalization  Outcome: Progressing  Flowsheets (Taken 03/17/2022 2347)  Patient will be free from injury during hospitalization:   Assess patient's risk for falls and implement fall prevention plan of care per policy   Provide and maintain safe environment   Assess for patients risk for elopement and implement Elopement Risk Plan per policy   Use appropriate transfer methods  Goal: Patient will be free from infection during  hospitalization  Outcome: Progressing  Flowsheets (Taken 03/18/2022 2327)  Free from Infection during hospitalization:   Assess and monitor for signs and symptoms of infection   Monitor lab/diagnostic results   Encourage patient and family to use good hand hygiene technique   Monitor all insertion sites (i.e. indwelling lines, tubes, urinary catheters, and drains)     Problem: Moderate/High Fall Risk Score >5  Goal: Patient will remain free of falls  Outcome: Progressing  Flowsheets (Taken 03/18/2022 2000)  High (Greater than 13):   HIGH-Visual cue at entrance to patient's room   HIGH-Bed alarm on at all times while patient in bed   HIGH-Utilize chair pad alarm for patient while in the chair   HIGH-Apply yellow "Fall Risk" arm band   HIGH-Initiate use of floor mats as appropriate   HIGH-Consider use of low bed     Problem: Diabetes: Glucose Imbalance  Goal: Blood glucose stable at established goal  Outcome: Progressing  Flowsheets (Taken 03/17/2022 2347)  Blood glucose stable at established goal:   Monitor lab values   Monitor intake and output.  Notify LIP if urine output is < 30 mL/hour.   Assess for hypoglycemia /hyperglycemia   Monitor/assess vital signs   Ensure appropriate diet and assess tolerance     Problem: Compromised Tissue integrity  Goal: Damaged tissue is healing and protected  Outcome: Progressing  Flowsheets (Taken 03/17/2022 2347)  Damaged tissue is healing and protected:   Monitor/assess Braden scale every shift   Provide wound care per wound care algorithm   Increase activity as tolerated/progressive mobility   Keep intact skin clean and  dry     Problem: Venous Thrombotic Event  Goal: Free from evidence of bleeding  Outcome: Progressing  Flowsheets (Taken 03/17/2022 2347)  Free from evidence of bleeding:   Administer VTE prophylaxis as prescribed   Administer Anticoagulants as prescribed   Initiate anticoagulation precautions   Assess patient's risk for falls and implement fall prevention plan of care  per policy   Monitor for signs and symptoms of bleeding   Monitor for adverse reactions to anticoagulant   Monitor results of ordered laboratory studies  Goal: Patient / Caregiver demonstrate/acknowledge a basic survival level of understanding on disease process, treatment plan, medications and discharge plan  Outcome: Progressing  Flowsheets (Taken 03/17/2022 2347)  Patient / Caregiver demonstrates/acknowledges a basic survival level of understanding on disease process, treatment plan, medications and discharge plan:   Patient will be in targeted therapeutic range for medications and be free from evidence of bleeding   Monitor for signs and symptoms of bleeding   Monitor for Signs of VTE (edema of calf/thigh, redness, pain)   Provide anticoagulation education if indicated (e.g., heparin, Warfarin, Enoxaparin, Lepirudin)

## 2022-03-19 LAB — COMPREHENSIVE METABOLIC PANEL
ALT: 12 U/L (ref 0–55)
AST (SGOT): 13 U/L (ref 5–41)
Albumin/Globulin Ratio: 0.6 — ABNORMAL LOW (ref 0.9–2.2)
Albumin: 2.3 g/dL — ABNORMAL LOW (ref 3.5–5.0)
Alkaline Phosphatase: 66 U/L (ref 37–117)
Anion Gap: 12 (ref 5.0–15.0)
BUN: 40 mg/dL — ABNORMAL HIGH (ref 9.0–28.0)
Bilirubin, Total: 0.8 mg/dL (ref 0.2–1.2)
CO2: 24 mEq/L (ref 17–29)
Calcium: 8.3 mg/dL (ref 7.9–10.2)
Chloride: 105 mEq/L (ref 99–111)
Creatinine: 1.5 mg/dL (ref 0.5–1.5)
Globulin: 4.1 g/dL — ABNORMAL HIGH (ref 2.0–3.6)
Glucose: 106 mg/dL — ABNORMAL HIGH (ref 70–100)
Potassium: 4.9 mEq/L (ref 3.5–5.3)
Protein, Total: 6.4 g/dL (ref 6.0–8.3)
Sodium: 141 mEq/L (ref 135–145)
eGFR: 48 mL/min/{1.73_m2} — AB (ref >=60)

## 2022-03-19 LAB — CBC AND DIFFERENTIAL
Absolute NRBC: 0 10*3/uL (ref 0.00–0.00)
Hematocrit: 32.9 % — ABNORMAL LOW (ref 37.6–49.6)
Hgb: 10.7 g/dL — ABNORMAL LOW (ref 12.5–17.1)
Instrument Absolute Neutrophil Count: 5.24 10*3/uL (ref 1.10–6.33)
MCH: 34.1 pg — ABNORMAL HIGH (ref 25.1–33.5)
MCHC: 32.5 g/dL (ref 31.5–35.8)
MCV: 104.8 fL — ABNORMAL HIGH (ref 78.0–96.0)
MPV: 11.4 fL (ref 8.9–12.5)
Nucleated RBC: 0 /100 WBC (ref 0.0–0.0)
Platelets: 208 10*3/uL (ref 142–346)
RBC: 3.14 10*6/uL — ABNORMAL LOW (ref 4.20–5.90)
RDW: 14 % (ref 11–15)
WBC: 53.42 10*3/uL — ABNORMAL HIGH (ref 3.10–9.50)

## 2022-03-19 LAB — CELL MORPHOLOGY
Cell Morphology: NORMAL
Platelet Estimate: NORMAL

## 2022-03-19 LAB — MAGNESIUM: Magnesium: 2.1 mg/dL (ref 1.6–2.6)

## 2022-03-19 LAB — MAN DIFF ONLY
Band Neutrophils Absolute: 1.07 10*3/uL — ABNORMAL HIGH (ref 0.00–1.00)
Band Neutrophils: 2 %
Basophils Absolute Manual: 0 10*3/uL (ref 0.00–0.08)
Basophils Manual: 0 %
Eosinophils Absolute Manual: 9.08 10*3/uL — ABNORMAL HIGH (ref 0.00–0.44)
Eosinophils Manual: 17 %
Lymphocytes Absolute Manual: 28.85 10*3/uL — ABNORMAL HIGH (ref 0.42–3.22)
Lymphocytes Manual: 54 %
Monocytes Absolute: 0 10*3/uL — ABNORMAL LOW (ref 0.21–0.85)
Monocytes Manual: 0 %
Neutrophils Absolute Manual: 14.42 10*3/uL — ABNORMAL HIGH (ref 1.10–6.33)
Segmented Neutrophils: 27 %

## 2022-03-19 LAB — LACTATE DEHYDROGENASE: LDH: 298 U/L (ref 120–331)

## 2022-03-19 LAB — GLUCOSE WHOLE BLOOD - POCT
Whole Blood Glucose POCT: 117 mg/dL — ABNORMAL HIGH (ref 70–100)
Whole Blood Glucose POCT: 151 mg/dL — ABNORMAL HIGH (ref 70–100)
Whole Blood Glucose POCT: 206 mg/dL — ABNORMAL HIGH (ref 70–100)
Whole Blood Glucose POCT: 200 mg/dL — ABNORMAL HIGH (ref 70–100)

## 2022-03-19 LAB — PHOSPHORUS: Phosphorus: 4 mg/dL (ref 2.3–4.7)

## 2022-03-19 LAB — URIC ACID: Uric acid: 12.6 mg/dL — ABNORMAL HIGH (ref 3.6–9.7)

## 2022-03-19 MED ORDER — VALSARTAN 40 MG PO TABS
20.0000 mg | ORAL_TABLET | Freq: Two times a day (BID) | ORAL | Status: DC
Start: 2022-03-19 — End: 2022-03-20
  Administered 2022-03-19 – 2022-03-20 (×3): 20 mg via ORAL
  Filled 2022-03-19 (×8): qty 1

## 2022-03-19 MED ORDER — ENOXAPARIN SODIUM 100 MG/ML IJ SOSY
1.0000 mg/kg | PREFILLED_SYRINGE | Freq: Two times a day (BID) | INTRAMUSCULAR | Status: DC
Start: 2022-03-19 — End: 2022-03-20
  Administered 2022-03-19 – 2022-03-20 (×3): 90 mg via SUBCUTANEOUS
  Filled 2022-03-19 (×3): qty 1

## 2022-03-19 NOTE — Progress Notes (Signed)
[name] [place] [facility]  Progress Note    Patient Info:   Date/Time: 03/19/2022 / 3:41 PM   Patient Name:[name] Vernell LeepEdward [name]   ZOX:[id number]    PCP: Zorita PangNoelle, Holger, MD   Admit Date:03/09/2022   Attending Physician:Adi Viona Gilmoreeddy, Shayleen Eppinger, MD      Assessment and Plan:     76 year old gentleman admitted on 5/22 with dyspnea, orthopnea, scrotal swelling.  S/P IMCU transfer on 5/25 secondary to hypotension.  Transferred back to the floor 5/30.    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  High sensitivity troponin negative x 3.  NT proBNP on admission elevated at 4091.  CXR on admission shows no acute process.  CT abdomen/pelvis on 5/24 shows retroperitoneal, inguinal and mesenteric adenopathy, moderate scrotal wall thickening.  Recent echo on 05/29/21 shows EF of 25-30%, grade III diastolic dysfunction, moderately dilated LA, moderate MR, moderate TR, moderate pulmonary hypertension.      S/P IV lasix.  Noted to by hypotensive on 5/25, possibly due to overdiuresis.  S/P levophed infusion, which was stopped on 5/27.  Oral lasix resumed 5/28.  Home torsemide regimen resumed 5/30, which includes torsemide 40 mg in AM, 20 mg in PM.  Toprol 25 mg daily regimen to be started today.  Of note, he is on coreg 25 mg bid as outpatient, and does have ectopy on telemetry.  Cardiology and heart failure team following  -Restarted on low-dose Toprol, Diovan and spironolactone with holding parameters.  Monitor BP closely  -Continue torsemide      AKI on CKD stage 3  Baseline Cr of 1.6-1.8 per EMR.  Cr at 1.7 on admission, but peaked to as high as 2.3 on 5/25, likely due to ATN in setting of hypoperfusion + hypotension.  Creatinine improving.  Monitor closely while on heart failure medication regimen    RLE DVT  Venous dopplers on 5/30 show occlusive thrombus in the right common femoral vein and distal right external iliac vein. On  lovenox  Discussed with Dr. Cam Haiaher.  We will switch to DOAC's upon discharge     LLE cellulitis  S/P rocephin.  On augmentin, day 3.  Appreciate ID input    Moderate complex left hydrocele with septations  Noted on scrotal US from 5/19.  Scrotal elevation, scrotal support, mobilization recommended.  Scrotal edema likely secondary to CLL and CHF.  No surgery at this time.  Follow up with urology as outpatient, Dr. Edwyna ShellHart, for moderate complex left hydrocele.  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  Not on current therapy  Discussed with oncology.  Patient to follow-up as outpatient to initiate the treatment    Type 2 DM  HbA2c of 5.4 on 5/26.  Continue lantus, SSI.    Oral medications on hold    CAD  Nonobstructive.  Continue aspirin, lipitor    Macrocytic anemia  Hb is stable.  Monitor     COPD  Without exacerbation.  Continue breo    Depression  Continue paxil        DVT Prohylaxis:lovenox    Code Status: Full Code   Disposition: Home with home health services likely in a.m.   Type of Admission:Inpatient   Estimated Length of Stay (including stay in the ER receiving treatment): 1 day   Medical Necessity for stay:  heart failure optimization     Subjective:  Chief Complaint:  Chief Complaint   Patient presents with    Shortness of Breath    Groin Swelling    Leg Swelling        Breathing at baseline.  No chest pain.   Medications:Meds have been reviewed   Current Facility-Administered Medications   Medication Dose Route Frequency Last Rate Last Admin    acetaminophen (TYLENOL) tablet 650 mg  650 mg Oral Q6H   650 mg at 03/19/22 1027    amoxicillin-clavulanate (AUGMENTIN) 500-125 MG per tablet 1 tablet  1 tablet Oral Q12H [facility]   1 tablet at 03/19/22 0902    aspirin EC tablet 81 mg  81 mg Oral Daily   81 mg at 03/19/22 0902    atorvastatin (LIPITOR) tablet 40 mg  40 mg Oral QHS   40 mg at 03/18/22 2203    dextrose (GLUCOSE) 40 % oral gel 15 g of glucose  15 g of glucose Oral PRN         Or    dextrose (D10W) 10% bolus 125 mL  12.5 g Intravenous PRN        Or    dextrose 50 % bolus 12.5 g  12.5 g Intravenous PRN        Or    glucagon (rDNA) (GLUCAGEN) injection 1 mg  1 mg Intramuscular PRN        enoxaparin (LOVENOX) syringe 90 mg  1 mg/kg Subcutaneous Q12H   90 mg at 03/19/22 1230    fluticasone furoate-vilanterol (BREO ELLIPTA) 100-25 MCG/ACT 1 puff  1 puff Inhalation QAM   1 puff at 03/19/22 0822    insulin glargine (LANTUS) injection 40 Units  40 Units Subcutaneous QAM   40 Units at 03/19/22 0840    insulin lispro injection 1-6 Units  1-6 Units Subcutaneous QHS   1 Units at 03/18/22 2202    insulin lispro injection 2-10 Units  2-10 Units Subcutaneous TID AC   2 Units at 03/18/22 1146    lidocaine (XYLOCAINE) 4 % external solution   Topical Q6H PRN   Given at 03/16/22 0404    melatonin tablet 3 mg  3 mg Oral QHS   3 mg at 03/18/22 2203    metoprolol succinate XL (TOPROL-XL) 24 hr tablet 12.5 mg  12.5 mg Oral Daily   12.5 mg at 03/19/22 0902    miconazole 2 % with zinc oxide (BAZA/SECURA ANTIFUNGAL) cream   Topical BID   Given at 03/19/22 0914    niacin (NIASPAN) CR tablet 500 mg  500 mg Oral BID   500 mg at 03/19/22 0910    ondansetron (ZOFRAN) injection 4 mg  4 mg Intravenous Q8H PRN   4 mg at 03/15/22 1119    PARoxetine (PAXIL) tablet 20 mg  20 mg Oral QAM   20 mg at 03/19/22 0900    polyethylene glycol (MIRALAX) packet 17 g  17 g Oral Daily   17 g at 03/19/22 0903    senna-docusate (PERICOLACE) 8.6-50 MG per tablet 1 tablet  1 tablet Oral QHS   1 tablet at 03/18/22 2203    spironolactone (ALDACTONE) tablet 12.5 mg  12.5 mg Oral Daily   12.5 mg at 03/19/22 0911    torsemide (DEMADEX) tablet 20 mg  20 mg Oral Q24H   20 mg at 03/18/22 1435    torsemide (DEMADEX) tablet 40 mg  40 mg Oral Daily   40 mg at 03/19/22 0903    valsartan (DIOVAN) tablet 20 mg  20 mg Oral Q12H SCH   20 mg at 03/19/22 1026           Objective:   Vitals: Vitals reviewed height is 1.829 m (6') and weight is 93.6 kg (206  lb 4.8 oz). His temporal temperature is 97.6 F (36.4 C). His blood pressure is 111/69 and his pulse is 84. His respiration is 17 and oxygen saturation is 95%. Body mass index is 27.98 kg/m.  Vitals:    03/19/22 0812 03/19/22 0823 03/19/22 1026 03/19/22 1147   BP: 106/67  108/69 111/69   Pulse: 79   84   Resp: 17   17   Temp: 97.4 F (36.3 C)   97.6 F (36.4 C)   TempSrc: Temporal   Temporal   SpO2: 94% 93%  95%   Weight:       Height:       Intake and Output Summary (Last 24 hours) at Date Time   Intake/Output Summary (Last 24 hours) at 03/19/2022 1541  Last data filed at 03/19/2022 1249  Gross per 24 hour   Intake 1146 ml   Output 2200 ml   Net -1054 ml      Physical Exam:       Physical Exam  Constitutional:       General: He is not in acute distress.     Comments: Appears weak   HENT:      Head: Normocephalic and atraumatic.   Eyes:      Extraocular Movements: Extraocular movements intact.      Conjunctiva/sclera: Conjunctivae normal.   Cardiovascular:      Rate and Rhythm: Normal rate and regular rhythm.      Pulses: Normal pulses.      Heart sounds: Normal heart sounds.   Pulmonary:      Effort: Pulmonary effort is normal. No respiratory distress.      Breath sounds: No wheezing.      Comments: Diminished breath sounds bilaterally  Abdominal:      General: Bowel sounds are normal. There is no distension.      Palpations: Abdomen is soft.      Tenderness: There is no abdominal tenderness.   Musculoskeletal:         General: No swelling. Normal range of motion.      Right lower leg: No edema.      Left lower leg: No edema.   Skin:     General: Skin is warm.      Findings: Rash present. No lesion.   Neurological:      General: No focal deficit present.      Mental Status: He is alert and oriented to person, place, and time.   Psychiatric:         Mood and Affect: Mood normal.           Results of Labs/imaging   Labs have been reviewed:   Coagulation Profile:   Recent Labs   Lab 03/18/22  0029   PT 15.6*   PT INR  1.3*   PTT 34          CBC review:   Recent Labs   Lab 03/19/22  0619 03/18/22  0029 03/17/22  0240 03/16/22  0319 03/15/22  0138 03/14/22  0424   WBC 53.42* 49.06* 43.10* 37.78* 34.07* 30.56*   Hgb 10.7* 10.6* 10.2* 11.0* 11.5* 11.9*   Hematocrit 32.9* 32.8* 31.8* 33.1* 34.7* 35.5*   Platelets 208 207 197 202 186 166  MCV 104.8* 104.5* 103.9* 102.5* 102.1* 103.2*   RDW 14 14 14 14 14 14    Segmented Neutrophils 27 30  --  14 10 18       Chem Review:  Recent Labs   Lab 03/19/22  0619 03/18/22  0029 03/17/22  0240 03/16/22  0319 03/15/22  0138   Sodium 141 135 130* 131* 132*   Potassium 4.9 4.8 4.6 4.6 4.6   Chloride 105 102 100 100 101   CO2 24 23 20 21 20    BUN 40.0* 52.0* 58.0* 60.0* 58.0*   Creatinine 1.5 1.6* 1.6* 1.9* 1.7*   Glucose 106* 192* 202* 238* 288*   Calcium 8.3 8.0 8.1 8.0 8.1   Magnesium 2.1 2.4 2.5 2.4 2.5   Phosphorus 4.0 3.4 3.4 3.5 3.0   Bilirubin, Total 0.8  --   --   --   --    AST (SGOT) 13  --   --   --   --    ALT 12  --   --   --   --    Alkaline Phosphatase 66  --   --   --   --       Results       Procedure Component Value Units Date/Time    Glucose Whole Blood - POCT 03/18/22  (Abnormal) Collected: 03/19/22 1141     Updated: 03/19/22 1144     Whole Blood Glucose POCT 151 mg/dL     Glucose Whole Blood - POCT 05/19/22  (Abnormal) Collected: 03/19/22 0733     Updated: 03/19/22 0737     Whole Blood Glucose POCT 117 mg/dL     Cell MorpHology 05/19/22  (Abnormal) Collected: 03/19/22 0619     Updated: 03/19/22 0719     Cell Morphology Normal     Platelet Estimate Normal     Smudge Cells Present    Narrative:      If heparin infusion is on [facility] hold do not draw this lab    Manual Differential 05/19/22  (Abnormal) Collected: 03/19/22 0619     Updated: 03/19/22 0719     Segmented Neutrophils 27 %      Band Neutrophils 2 %      Lymphocytes Manual 54 %      Monocytes Manual 0 %      Eosinophils Manual 17 %      Basophils Manual 0 %      Neutrophils Absolute Manual 14.42 x10 3/uL      Band  Neutrophils Absolute 1.07 x10 3/uL      Lymphocytes Absolute Manual 28.85 x10 3/uL      Monocytes Absolute 0.00 x10 3/uL      Eosinophils Absolute Manual 9.08 x10 3/uL      Basophils Absolute Manual 0.00 x10 3/uL     Narrative:      If heparin infusion is on [facility] hold do not draw this lab    CBC and differential 05/19/22  (Abnormal) Collected: 03/19/22 0619    Specimen: Blood Updated: 03/19/22 0719     WBC 53.42 x10 3/uL      Hgb 10.7 g/dL      Hematocrit [[id number]] %      Platelets 208 x10 3/uL      RBC 3.14 x10 6/uL      MCV 104.8 fL      MCH 34.1 pg      MCHC 32.5 g/dL      RDW 14 %  MPV 11.4 fL      Nucleated RBC 0.0 /100 WBC      Absolute NRBC 0.00 x10 3/uL      Instrument Absolute Neutrophil Count 5.24 x10 3/uL     Narrative:      If heparin infusion is on [facility] hold do not draw this lab    Magnesium [[id number]] Collected: 03/19/22 0619    Specimen: Blood Updated: 03/19/22 0658     Magnesium 2.1 mg/dL     Narrative:      If heparin infusion is on [facility] hold do not draw this lab    Phosphorus [[id number]] Collected: 03/19/22 0619    Specimen: Blood Updated: 03/19/22 7829     Phosphorus 4.0 mg/dL     Narrative:      If heparin infusion is on [facility] hold do not draw this lab    Comprehensive metabolic panel [[id number]]  (Abnormal) Collected: 03/19/22 0619    Specimen: Blood Updated: 03/19/22 0658     Glucose 106 mg/dL      BUN 78.4 mg/dL      Creatinine 1.5 mg/dL      Calcium 8.3 mg/dL      Sodium 696 mEq/L      Potassium 4.9 mEq/L      Chloride 105 mEq/L      CO2 24 mEq/L      Anion Gap 12.0     eGFR 48.0 mL/min/1.73 m2      Protein, Total 6.4 g/dL      Albumin 2.3 g/dL      AST (SGOT) 13 U/L      ALT 12 U/L      Alkaline Phosphatase 66 U/L      Bilirubin, Total 0.8 mg/dL      Globulin 4.1 g/dL      Albumin/Globulin Ratio 0.6    Narrative:      If heparin infusion is on [facility] hold do not draw this lab    Lactate dehydrogenase [[id number]] Collected: 03/19/22 0619    Specimen: Blood Updated: 03/19/22 0657     LDH 298 U/L      Uric acid [[id number]]  (Abnormal) Collected: 03/19/22 0619    Specimen: Blood Updated: 03/19/22 0657     Uric acid 12.6 mg/dL     CLL Prognostic Panel, Comprehensive [[id number]] Collected: 03/19/22 0619     Updated: 03/19/22 0636    Narrative:      Send to Lab Immediately, 48 hr stability  Send to Lab Immediately, 48 hr stability  Send to Lab Immediately, 48 hr stability  Send to Lab Immediately, 48 hr stability    Glucose Whole Blood - POCT [[id number]]  (Abnormal) Collected: 03/18/22 2137     Updated: 03/18/22 2141     Whole Blood Glucose POCT 185 mg/dL     Glucose Whole Blood - POCT [[id number]]  (Abnormal) Collected: 03/18/22 1637     Updated: 03/18/22 1644     Whole Blood Glucose POCT 156 mg/dL            Radiology reports have been reviewed:  Radiology Results (24 Hour)       Procedure Component Value Units Date/Time    CT Chest WO Contrast [[id number]] Collected: 03/18/22 1733    Order Status: Completed Updated: 03/18/22 1740    Narrative:      HISTORY:  CLL.    COMPARISON: 08/22/2017.    TECHNIQUE: CT of the chest performed without intravenous contrast. The following dose reduction techniques were utilized: automated exposure  control and/or adjustment of the mA and/or KV according to patient size, and the use of an iterative   reconstruction technique.    FINDINGS:   Axillary, mediastinal and supraclavicular adenopathy is present. The hila are poorly visualized without contrast. An index right axillary node measures 2.2 x 1.6 cm (series 5, image 29). The mediastinal nodes are subcentimeter but markedly increased in   number. The heart is mildly enlarged with a trace left effusion. No pericardial or right pleural fluid is present. There is mild bronchiectasis with scattered subcentimeter granulomata. No suspicious lung mass, pneumonia or pneumothorax is present.      Impression:       Thoracic adenopathy and trace left effusion.    Bosie Helper, MD  03/18/2022 5:38 PM    CT Soft Tissue Neck WO Contrast  [[id number]] Collected: 03/18/22 1726    Order Status: Completed Updated: 03/18/22 1739    Narrative:      HISTORY: Leukemia, staging.    COMPARISON: None available.    TECHNIQUE: CT of the neck performed without intravenous contrast. Multiplanar reformatted images were created and reviewed. The following dose reduction techniques were utilized: automated exposure control and/or adjustment of the mA and/or KV according   to patient size, and the use of an iterative reconstruction technique.    FINDINGS:   Numerous lymph nodes are seen along the jugular chains, and the submandibular regions, the posterior triangles on both sides. This is somewhat poorly defined without intravenous contrast. The lymph nodes are overall small with a representative larger   right level 2 lymph node measuring 1.4 x 0.6 cm in axial plane. Some larger lymph nodes are seen more inferiorly within the neck with a larger left supraclavicular lymph node measuring 1.9 x 1.3 cm in axial plane.    The pharynx, larynx, the major salivary glands, the thyroid gland appear unremarkable. The visualized portions of the brain parenchyma are unremarkable as well. There are advanced degenerative changes in the cervical spine resulting in multilevel spinal   canal and neural foraminal narrowing.      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    Terrilee Croak, MD  03/18/2022 5:37 PM                Total time spent-35 min  Hospitalist   Signed by: Julien Girt, MD   03/19/2022 3:41 PM

## 2022-03-19 NOTE — Plan of Care (Signed)
Problem: Day of Admission - Heart Failure  Goal: Heart Failure Admission  Outcome: Progressing  Flowsheets (Taken 03/09/2022 1834 by Clifton Custard, RN)  Heart Failure Admission:   Standing Weight on admission, if unable to stand zero the bed and use the bed scale   Vital signs and telemetry per policy   Assess for swelling/edema and document   Oxygen as needed   Fluid restriction   Strict Intake/Output   Initiate education with patient and caregiver using CHF Warning Zones and Educational Videos ([employer] or [employer])     Problem: Everyday - Heart Failure  Goal: Stable Vital Signs and Fluid Balance  Outcome: Progressing  Flowsheets (Taken 03/17/2022 2347 by Adaline Sill, RN)  Stable Vital Signs and Fluid Balance:   Daily Standing Weights in the morning using the same scale, after using the bathroom and before breadfast.  If unable to stand, zero the bed and use the bed scale   Monitor, assess vital signs and telemetry per policy   Strict Intake/Output   Monitor labs and report abnormalities to physician   Assess for swelling/edema  Goal: Mobility/Activity is Maintained at Optimal Level for Patient  Outcome: Progressing  Flowsheets (Taken 03/17/2022 2347 by Adaline Sill, RN)  Mobility/Activity is Maintained at Optimal Level for Patient:   Increase mobility as tolerated/progressive mobility protocol   Reposition patient every 2 hours and as needed unless able to reposition self   Assess for changes in respiratory status, level of consciousness and/or development of fatigue  Goal: Nutritional Intake is Adequate  Outcome: Progressing  Flowsheets (Taken 03/17/2022 2347 by Adaline Sill, RN)  Nutritional Intake is Adequate:   Consult/Collaborate with Nutritionist   Assess appetite,anorexia and amount of meal/food tolerated  Goal: Teaching-Using CHF Warning Zones and Educational Videos  Outcome: Progressing  Flowsheets (Taken 03/17/2022 2347 by Adaline Sill, RN)  Teaching-Using CHF Warning Zones and Educational  Videos:   Signs & Symptoms of CHF   Daily Standing Weights & record   CHF Warning Zones and when to call for help     Problem: Day of Discharge - Heart Failure  Goal: Discharge Education  Outcome: Progressing     Problem: Safety  Goal: Patient will be free from injury during hospitalization  Outcome: Progressing  Flowsheets (Taken 03/17/2022 2347 by Adaline Sill, RN)  Patient will be free from injury during hospitalization:   Assess patient's risk for falls and implement fall prevention plan of care per policy   Provide and maintain safe environment   Assess for patients risk for elopement and implement Elopement Risk Plan per policy   Use appropriate transfer methods  Goal: Patient will be free from infection during hospitalization  Outcome: Progressing  Flowsheets (Taken 03/18/2022 2327 by Adaline Sill, RN)  Free from Infection during hospitalization:   Assess and monitor for signs and symptoms of infection   Monitor lab/diagnostic results   Encourage patient and family to use good hand hygiene technique   Monitor all insertion sites (i.e. indwelling lines, tubes, urinary catheters, and drains)     Problem: Pain  Goal: Pain at adequate level as identified by patient  Outcome: Progressing  Flowsheets (Taken 03/17/2022 0915 by Cherly Hensen, RN)  Pain at adequate level as identified by patient:   Identify patient comfort function goal   Assess for risk of opioid induced respiratory depression, including snoring/sleep apnea. Alert healthcare team of risk factors identified.   Assess pain on admission, during daily assessment and/or before any "as needed" intervention(s)  Reassess pain within 30-60 minutes of any procedure/intervention, per Pain Assessment, Intervention, Reassessment (AIR) Cycle   Evaluate if patient comfort function goal is met   Offer non-pharmacological pain management interventions   Consult/collaborate with Physical Therapy, Occupational Therapy, and/or Speech Therapy   Include patient/patient  care companion in decisions related to pain management as needed   Consult/collaborate with Pain Service   Evaluate patient's satisfaction with pain management progress     Problem: Side Effects from Pain Analgesia  Goal: Patient will experience minimal side effects of analgesic therapy  Outcome: Progressing     Problem: Discharge Barriers  Goal: Patient will be discharged home or other facility with appropriate resources  Outcome: Progressing  Flowsheets (Taken 03/11/2022 1034 by Theodoro Doing, RN)  Discharge to home or other facility with appropriate resources:   Provide appropriate patient education   Provide information on available health resources   Initiate discharge planning     Problem: Psychosocial and Spiritual Needs  Goal: Demonstrates ability to cope with hospitalization/illness  Outcome: Progressing  Flowsheets (Taken 03/11/2022 1034 by Theodoro Doing, RN)  Demonstrates ability to cope with hospitalizations/illness:   Encourage verbalization of feelings/concerns/expectations   Assist patient to identify own strengths and abilities   Provide quiet environment   Encourage patient to set small goals for self   Reinforce positive adaptation of new coping behaviors   Encourage participation in diversional activity   Include patient/ patient care companion in decisions     Problem: Moderate/High Fall Risk Score >5  Goal: Patient will remain free of falls  Outcome: Progressing  Flowsheets  Taken 03/19/2022 1538  VH High Risk (Greater than 13):   ALL REQUIRED LOW INTERVENTIONS   ALL REQUIRED MODERATE INTERVENTIONS   RED "HIGH FALL RISK" SIGNAGE   BED ALARM WILL BE ACTIVATED WHEN THE PATEINT IS IN BED WITH SIGNAGE "RESET BED ALARM"   A CHAIR PAD ALARM WILL BE USED WHEN PATIENT IS UP SITTING IN A CHAIR   PATIENT IS TO BE SUPERVISED FOR ALL TOILETING ACTIVITIES  Taken 03/19/2022 0800  High (Greater than 13):   HIGH-Initiate use of floor mats as appropriate   HIGH-Pharmacy to initiate evaluation and  intervention per protocol   HIGH-Bed alarm on at all times while patient in bed   HIGH-Visual cue at entrance to patient's room   HIGH-Apply yellow "Fall Risk" arm band   HIGH-Utilize chair pad alarm for patient while in the chair     Problem: Fluid and Electrolyte Imbalance/ Endocrine  Goal: Fluid and electrolyte balance are achieved/maintained  Outcome: Progressing  Flowsheets (Taken 03/17/2022 0915 by Cherly Hensen, RN)  Fluid and electrolyte balance are achieved/maintained:   Monitor intake and output every shift   Assess for confusion/personality changes   Observe for seizure activity and initiate seizure precautions if indicated   Monitor for muscle weakness   Monitor daily weight   Monitor/assess lab values and report abnormal values   Assess and reassess fluid and electrolyte status   Provide adequate hydration   Observe for cardiac arrhythmias  Goal: Adequate hydration  Outcome: Progressing     Problem: Diabetes: Glucose Imbalance  Goal: Blood glucose stable at established goal  Outcome: Progressing     Problem: Compromised Tissue integrity  Goal: Damaged tissue is healing and protected  Outcome: Progressing  Flowsheets (Taken 03/17/2022 2347 by Adaline Sill, RN)  Damaged tissue is healing and protected:   Monitor/assess Braden scale every shift   Provide wound care per wound care  algorithm   Increase activity as tolerated/progressive mobility   Keep intact skin clean and dry  Goal: Nutritional status is improving  Outcome: Progressing  Flowsheets (Taken 03/17/2022 0915 by Cherly Hensen, RN)  Nutritional status is improving:   Assist patient with eating   Collaborate with Clinical Nutritionist   Allow adequate time for meals   Include patient/patient care companion in decisions related to nutrition   Encourage patient to take dietary supplement(s) as ordered     Problem: Venous Thrombotic Event  Goal: Knowledge deficit/VTE Prevention  Outcome: Progressing  Goal: Free from evidence of bleeding  Outcome:  Progressing  Goal: Patient / Caregiver demonstrate/acknowledge a basic survival level of understanding on disease process, treatment plan, medications and discharge plan  Outcome: Progressing

## 2022-03-19 NOTE — Progress Notes (Signed)
Infectious Disease            Progress Note    03/19/2022   [name] UJW:[id number] is a 76 y.o. male, with history significant for diabetes mellitus, hypertension, chronic kidney disease, anxiety, depression, sleep apnea, COPD, hyperlipidemia, congestive heart failure, nonischemic cardiomyopathy, status post AICD placement, CAD with leukocytosis, lymphadenopathy.    Subjective:     [name] [name] today Symptoms: Afebrile, no new complaints, feeling stable, no vomiting or diarrhea, pain under control. Other review of system is non contributory.    Objective:     Blood pressure 106/67, pulse 79, temperature 97.4 F (36.3 C), temperature source Temporal, resp. rate 17, height 1.829 m (6'), weight 93.6 kg (206 lb 4.8 oz), SpO2 93 %.    General Appearance: No acute distress  HEENT: Pallor negative, Anicteric sclera.    Neck:    Supple  Lungs: Decreased breath sound at bases.    Chest Wall: Symmetric chest wall expansion.   Heart : S1 and S2.   Abdomen: Abdomen is soft, bowel sounds positive.  Neurological: Alert and oriented to person, place and time.  Moves all extremities  Extremities: Lower extremity edema  Skin/scrotum: Scrotal swelling and tenderness  Psychiatric: Mood and affect is normal    Laboratory And Diagnostic Studies:     Recent Labs     03/19/22  0619 03/18/22  0029   WBC 53.42* 49.06*   Hgb 10.7* 10.6*   Hematocrit 32.9* 32.8*   Platelets 208 207   Segmented Neutrophils 27 30       Recent Labs     03/19/22  0619 03/18/22  0029   Sodium 141 135   Potassium 4.9 4.8   Chloride 105 102   CO2 24 23   BUN 40.0* 52.0*   Creatinine 1.5 1.6*   Glucose 106* 192*   Calcium 8.3 8.0         Current Med's:     Current Facility-Administered Medications   Medication Dose Route Frequency    acetaminophen  650 mg Oral Q6H    amoxicillin-clavulanate  1 tablet Oral Q12H SCH    aspirin EC  81 mg Oral Daily    atorvastatin  40 mg Oral QHS    enoxaparin  1 mg/kg Subcutaneous Q12H    fluticasone  furoate-vilanterol  1 puff Inhalation QAM    insulin glargine  40 Units Subcutaneous QAM    insulin lispro  1-6 Units Subcutaneous QHS    insulin lispro  2-10 Units Subcutaneous TID AC    melatonin  3 mg Oral QHS    metoprolol succinate XL  12.5 mg Oral Daily    miconazole 2 % with zinc oxide   Topical BID    niacin  500 mg Oral BID    PARoxetine  20 mg Oral QAM    polyethylene glycol  17 g Oral Daily    senna-docusate  1 tablet Oral QHS    spironolactone  12.5 mg Oral Daily    torsemide  20 mg Oral Q24H    torsemide  40 mg Oral Daily       Lines/Drains:     Patient Lines/Drains/Airways Status       Active Lines, Drains and Airways       Name Placement date Placement time Site Days    Peripheral IV 03/12/22 22 G Left;Posterior Forearm 03/12/22  0100  Forearm  7    Peripheral IV 03/14/22 20 G Anterior;Distal;Right Forearm 03/14/22  0400  Forearm  5                    Assessment:     Condition: Guarded  Lower extremity cellulitis  Retroperitoneal, inguinal, mesenteric lymphadenopathy  CLL  Congestive heart failure  Cardiomyopathy  S/p ICD placement  Diabetes mellitus  Left hydrocele  Chronic kidney disease  Hypertension  Coronary artery disease  Chronic obstructive pulmonary disease  Hyperlipidemia  Sleep apnea  Depression    Plan:     Continue Augmentin  Hematology follow-up  Cardiology follow-up  Correction of electrolytes  Urology follow-up  Discussed with treatment team  Possible discharge, once cleared by hematology oncology and cardiology            Alfonzo Beers, MD, M.D.,FACP  03/19/2022  8:44 AM          *This note was generated by the Epic EMR system/ Dragon speech recognition and may contain inherent errors or omissions not intended by the user. Grammatical errors, random word insertions, deletions, pronoun errors and incomplete sentences are occasional consequences of this technology due to software limitations. Not all errors are caught or corrected. If there are questions or concerns about the  content of this note or information contained within the body of this dictation they should be addressed directly with the author for clarification

## 2022-03-19 NOTE — Progress Notes (Signed)
[facility] HEMATOLOGY ONCOLOGY PROGRESS NOTE    Date Time: 03/19/22 1:40 PM  Patient Name: [name] [name], [name] [name]      ASSESSMENT/PLAN:   Derion Kreiter is a 76 y.o. male who presents to the hospital on 03/09/2022 with     Principal Problem:    Acute on chronic congestive heart failure, unspecified heart failure type  Active Problems:    Scrotal edema    Hydrocele, unspecified hydrocele type      CLL symptomatic. D/w patient regarding treatment BTK inhibitors or venetoclax.  Side effects benefits of both were explained and understood.  He knows that I will call for an appointment early next week so we can discuss this in greater detail.    Congestive heart failure as per cardiology    Scrotal edema I think will resolve once the enlarged lymph nodes are treated.    SUBJECTIVE:     No new complaints.  No bleeding.  No shortness of breath or cough.      PHYSICAL EXAM:     Vitals:    03/19/22 1147   BP: 111/69   Pulse: 84   Resp: 17   Temp: 97.6 F (36.4 C)   SpO2: 95%         Appearance: in no acute distress  HEENT: no icterus, no mucositis  Lungs: clear to ausculation  Abdomen: no tenderness, no organomegaly   Multiple enlarged lymph nodes  Neuro: alert, strength grossly intact  Skin: no rash, bleed or bruise        LABS AND RADIOLOGY:     CBC  Recent Labs   Lab 03/19/22  0619 03/18/22  0029 03/17/22  0240 03/16/22  0319 03/15/22  0138 03/14/22  0424   WBC 53.42* 49.06* 43.10* 37.78* 34.07* 30.56*   Hgb 10.7* 10.6* 10.2* 11.0* 11.5* 11.9*   Hematocrit 32.9* 32.8* 31.8* 33.1* 34.7* 35.5*   Platelets 208 207 197 202 186 166   MCV 104.8* 104.5* 103.9* 102.5* 102.1* 103.2*   MCH 34.1* 33.8* 33.3 34.1* 33.8* 34.6*   MCHC 32.5 32.3 32.1 33.2 33.1 33.5   RDW 14 14 14 14 14 14    Monocytes Absolute 0.00* 0.00*  --  1.13* 0.68 0.31       Chemistries   Recent Labs   Lab 03/19/22  0619 03/18/22  0029 03/17/22  0240 03/16/22  0319 03/15/22  0138   Sodium 141 135 130* 131* 132*   Potassium 4.9 4.8 4.6 4.6 4.6    Chloride 105 102 100 100 101   CO2 24 23 20 21 20    BUN 40.0* 52.0* 58.0* 60.0* 58.0*   Magnesium 2.1 2.4 2.5 2.4 2.5   AST (SGOT) 13  --   --   --   --    ALT 12  --   --   --   --    Alkaline Phosphatase 66  --   --   --   --      Coagulation Profile  Recent Labs   Lab 03/18/22  0029   PT INR 1.3*       Radiology Reports  Radiology Results (24 Hour)       Procedure Component Value Units Date/Time    CT Chest WO Contrast [[id number]] Collected: 03/18/22 1733    Order Status: Completed Updated: 03/18/22 1740    Narrative:      HISTORY:  CLL.    COMPARISON: 08/22/2017.    TECHNIQUE: CT of the chest performed  without intravenous contrast. The following dose reduction techniques were utilized: automated exposure control and/or adjustment of the mA and/or KV according to patient size, and the use of an iterative   reconstruction technique.    FINDINGS:   Axillary, mediastinal and supraclavicular adenopathy is present. The hila are poorly visualized without contrast. An index right axillary node measures 2.2 x 1.6 cm (series 5, image 29). The mediastinal nodes are subcentimeter but markedly increased in   number. The heart is mildly enlarged with a trace left effusion. No pericardial or right pleural fluid is present. There is mild bronchiectasis with scattered subcentimeter granulomata. No suspicious lung mass, pneumonia or pneumothorax is present.      Impression:       Thoracic adenopathy and trace left effusion.    Bosie Helper, MD  03/18/2022 5:38 PM    CT Soft Tissue Neck WO Contrast [[id number]] Collected: 03/18/22 1726    Order Status: Completed Updated: 03/18/22 1739    Narrative:      HISTORY: Leukemia, staging.    COMPARISON: None available.    TECHNIQUE: CT of the neck performed without intravenous contrast. Multiplanar reformatted images were created and reviewed. The following dose reduction techniques were utilized: automated exposure control and/or adjustment of the mA and/or KV according   to patient size,  and the use of an iterative reconstruction technique.    FINDINGS:   Numerous lymph nodes are seen along the jugular chains, and the submandibular regions, the posterior triangles on both sides. This is somewhat poorly defined without intravenous contrast. The lymph nodes are overall small with a representative larger   right level 2 lymph node measuring 1.4 x 0.6 cm in axial plane. Some larger lymph nodes are seen more inferiorly within the neck with a larger left supraclavicular lymph node measuring 1.9 x 1.3 cm in axial plane.    The pharynx, larynx, the major salivary glands, the thyroid gland appear unremarkable. The visualized portions of the brain parenchyma are unremarkable as well. There are advanced degenerative changes in the cervical spine resulting in multilevel spinal   canal and neural foraminal narrowing.      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    Terrilee Croak, MD  03/18/2022 5:37 PM                  Marvell Fuller, MD, MD  [facility]  [phone]

## 2022-03-19 NOTE — Consults (Signed)
[facility]   ADVANCED HEART FAILURE CONSULTATION REPORT  [facility]    Date Time: 03/19/22 9:34 AM  Patient Name: [name] [name]  Requesting Physician: Julien Girt, MD       Reason for Consultation:   CHF    History:   [name] [name] is a 76 y.o. male admitted on 03/09/2022.  We have been asked by Julien Girt, MD,  to provide cardiac consultation, regarding CHF.      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  For HF GDMT, he is on carvedilol 25 mg twice daily, Entresto 97/23 mg twice daily, Jardiance 10 mg daily, and spironolactone 12.5 mg daily in the outpatient setting.  He has a Medtronic ICD in place.    He was initially seen by urology couple weeks prior for severe scrotal edema.  He also mentioned that he had lower extremity edema as well.  Eventually was admitted for worsening swelling and shortness of breath.  He has been treated with IV Lasix 80 mg twice daily dosing.    Chest x-ray showed no acute cardiopulmonary process.  He had an ultrasound of the scrotum and testicles, showed moderate size, left high steel scrotal wall edema.  Past couple days, he has noticed improvement in his breathing and lower extremity edema.  However, he still has some lingering scrotal edema that is not quite back to baseline.    Seen by urology, who did not recommend surgery at this time.  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  He sees urologist Dr. Edwyna Shell in the outpatient setting.  He was also found to have right lower extremity DVT. Now on lovenox. ID following for LLE cellulitis.       Past Medical History:     Past Medical History:   Diagnosis Date    Acute CHF     NOV  & DEC 2018, - DIALYSIS CATH PLACED Aug 24 2017 TO REMOVE FLUID     Acute systolic (congestive) heart failure 08/2017    Anxiety     CAD (coronary artery disease)     Cardiomyopathy     nonischemic    CHF (congestive heart failure) 08/12/2014,  2013    Chronic obstructive pulmonary disease     POSSIBLE PER PT HE USES  SYNBICORT BID ABD  BREO INHALER PRN    Coronary artery disease 2011    CORONARY STENT PLACEMENT FOLLOWED BY [facility]    Depression     echocardiogram 02/2016, 11/2016, 09/2017, 12/20/2017    GERD (gastroesophageal reflux disease)     Heart attack 2011    and 2014    Hyperlipemia     Hypertension     ICD (implantable 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     PLACED 2014  [facility]  LAST INTERROGATION April 01 2018 REPORT REQUESTED    Ischemic cardiomyopathy     EF 15% ON ECHO 09-20-2017 DR GARG IN EPIC    Nonischemic cardiomyopathy     NSTEMI (non-ST elevated myocardial infarction)     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    Pacemaker 2014    MEDTRONIC ICD/PACEMAKER COMBO LAST INTERROGATION  12-12 2018    Pneumonia 09/2016    Primary cardiomyopathy     Ischemic cardiomyopathy EF 15% ON ECHO 09-20-2017 DR GARG IN EPIC    Sepsis 08/2017    Syncope and collapse     PRIOR TO ICD/PACEMAKER PLACED 2014  Type 2 diabetes mellitus, controlled DX 1998    BS  AVG 100  A1C 7.4 Dec 27 2017    [place]     PT SEE HIS PMD April 01 2018 RE THIS-TOLD TO SEE PMD BY [facility] WHEN THEY SAW HIM FOR A CL       Past Surgical History:     Past Surgical History:   Procedure Laterality Date    BIV  11/10/2016    ICD METRONIC  UPGRADE     CARDIAC CATHETERIZATION  03/2010    CORONARY STENT PLACEMENT X 2 PER PT; LM normal, LAD 70-80% distal lesion at apex, 20% lesion mid CFX    CARDIAC CATHETERIZATION  12/2012    CARDIAC DEFIBRILLATOR PLACEMENT  12/2012    [facility]    CARDIAC PACEMAKER PLACEMENT  2014    MEDTRONIC ICD/PACEMAKER PLACED [facility]    CIRCUMCISION  AGE 14    COLONOSCOPY  2009    CORRECTION HAMMER TOE      duodenal ulcer  1973    STRESS RELATED IN SCHOOL    ECHOCARDIOGRAM, TRANSTHORACIC  02/2016 11/2016 09/2017 12/2017    ECHOCARDIOGRAM, TRANSTHORACIC  02/2016,11/2016,12/20/2017    EGD N/A 08/15/2014    Procedure: EGD;  Surgeon: Colon Branch, MD;  [facility];  Service: Gastroenterology;  Laterality: N/A;  egd w/ bx    EGD  1975    EGD, COLONOSCOPY N/A 04/04/2018    Procedure: EGD with bxs, COLONOSCOPY with polypectomy and clipping;  Surgeon: Doyne Keel, MD;  Location: [facility];  Service: Gastroenterology;  Laterality: N/A;    FRACTURE SURGERY  AGE 89     RT  ANKLE- CAST APPLIED    HERNIA REPAIR  AGE 14    LEFT INGUINAL HERNIA    MPI nuclear study  06/2016    orthopedic surgery  AGE 65    RT foot corrective - HAMMERTOE    TONSILLECTOMY AND ADENOIDECTOMY  1954       Family History:     Family History   Problem Relation Age of Onset    Breast cancer Mother     Heart attack Mother 25    Hypertension Mother     Diabetes Mother     Coronary artery disease Mother     Diabetes Brother     Heart attack Father     Hypertension Father        Social History:     Social History     Socioeconomic History    Marital status: Married     Spouse name: Teacher, music    Number of children: 0    Years of education: Not on file    Highest education level: Not on file   Occupational History    Occupation: Runner, 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, [place]    Tobacco Use    Smoking status: Never    Smokeless tobacco: Never   Vaping Use    Vaping status: Never Used   Substance and Sexual Activity    Alcohol use: No     Alcohol/week: 0.0 standard drinks of alcohol     Comment: occasionally    Drug use: No    Sexual activity: Yes     Partners: Female   Other Topics Concern    Not on file   Social History Narrative    Not on file     Social Determinants of Health     Financial Resource Strain: Not on file  Food Insecurity: Not on file   Transportation Needs: Not on file   Physical Activity: Not on file   Stress: Not on file   Social Connections: Not on file   Intimate Partner Violence: Not on file   Housing Stability: Not on file       Allergies:   No Known Allergies    Medications:     Medications Prior to Admission   Medication Sig    aspirin EC 81 MG EC tablet Take 1 tablet (81 mg) by mouth daily     atorvastatin (LIPITOR) 40 MG tablet Take 1 tablet (40 mg total) by mouth nightly    carvedilol (COREG) 25 MG tablet Take 1 tablet (25 mg total) by mouth 2 (two) times daily with meals    empagliflozin (JARDIANCE) 10 MG tablet Take 1 tablet (10 mg total) by mouth daily    fluticasone-salmeterol (ADVAIR DISKUS) 100-50 MCG/ACT Aerosol Pwdr, Breath Activated Inhale 1 puff into the lungs 2 (two) times daily    glucose blood (ONE TOUCH ULTRA TEST) test strip 1 each by Other route 2 (two) times daily Use as instructed    hydrocodone-chlorpheniramine (TUSSIONEX) 10-8 MG/5ML extended-release suspension Take 5 mLs by mouth 2 (two) times daily    insulin glargine (LANTUS SOLOSTAR) 100 UNIT/ML injection pen Patient takes 50 units at night    Insulin Pen Needle (BD Pen Needle Nano U/F) 32G X 4 MM Misc Use as directed bid    metOLazone (ZAROXOLYN) 2.5 MG tablet Take 1 tablet (2.5 mg total) by mouth once a week    niacin (NIASPAN) 500 MG CR tablet TAKE 1 TABLET BY MOUTH TWICE DAILY    PARoxetine (Paxil) 20 MG tablet Take 1 tablet (20 mg total) by mouth every morning    sacubitril-valsartan (Entresto) 97-103 MG Tab per tablet Take 1 tablet by mouth 2 (two) times daily    Semglee, yfgn, 100 UNIT/ML Solution Pen-injector ADMINISTER 50 UNITS UNDER THE SKIN AT BEDTIME    SITagliptin-metFORMIN (Janumet) 50-500 MG tablet Take 1 tablet by mouth 2 (two) times daily with meals    spironolactone (ALDACTONE) 25 MG tablet Take 0.5 tablets (12.5 mg total) by mouth daily    torsemide (Demadex) 20 MG tablet Take 2 tablets (40 mg) by mouth every morning AND 1 tablet (20 mg) every evening.         Current Facility-Administered Medications   Medication Dose Route Frequency Provider Last Rate Last Admin    acetaminophen (TYLENOL) tablet 650 mg  650 mg Oral Q6H Blue, Ryann M, NP   650 mg at 03/19/22 0400    amoxicillin-clavulanate (AUGMENTIN) 500-125 MG per tablet 1 tablet  1 tablet Oral Q12H [facility] Choudhary, Sarfraz A, MD   1 tablet at 03/19/22 0902     aspirin EC tablet 81 mg  81 mg Oral Daily Cheema, Ladell Heads, MD   81 mg at 03/19/22 0902    atorvastatin (LIPITOR) tablet 40 mg  40 mg Oral QHS Cheema, Ladell Heads, MD   40 mg at 03/18/22 2203    dextrose (GLUCOSE) 40 % oral gel 15 g of glucose  15 g of glucose Oral PRN Cheema, Saima, MD        Or    dextrose (D10W) 10% bolus 125 mL  12.5 g Intravenous PRN Cheema, Saima, MD        Or    dextrose 50 % bolus 12.5 g  12.5 g Intravenous PRN Caffie Damme, MD        Or  glucagon (rDNA) (GLUCAGEN) injection 1 mg  1 mg Intramuscular PRN Cheema, Saima, MD        enoxaparin (LOVENOX) syringe 100 mg  1 mg/kg Subcutaneous Q12H Cherlynn Perches A, MD   100 mg at 03/18/22 2213    fluticasone furoate-vilanterol (BREO ELLIPTA) 100-25 MCG/ACT 1 puff  1 puff Inhalation QAM Caffie Damme, MD   1 puff at 03/19/22 0822    insulin glargine (LANTUS) injection 40 Units  40 Units Subcutaneous QAM Allegra Grana, MD   40 Units at 03/19/22 0840    insulin lispro injection 1-6 Units  1-6 Units Subcutaneous QHS Caffie Damme, MD   1 Units at 03/18/22 2202    insulin lispro injection 2-10 Units  2-10 Units Subcutaneous TID Remigio Eisenmenger, MD   2 Units at 03/18/22 1146    lidocaine (XYLOCAINE) 4 % external solution   Topical Q6H PRN Caffie Damme, MD   Given at 03/16/22 0404    melatonin tablet 3 mg  3 mg Oral QHS Blue, Ryann M, NP   3 mg at 03/18/22 2203    metoprolol succinate XL (TOPROL-XL) 24 hr tablet 12.5 mg  12.5 mg Oral Daily Marianna Fuss, MD   12.5 mg at 03/19/22 0902    miconazole 2 % with zinc oxide (BAZA/SECURA ANTIFUNGAL) cream   Topical BID Lubertha Basque R, NP   Given at 03/19/22 0914    niacin (NIASPAN) CR tablet 500 mg  500 mg Oral BID Caffie Damme, MD   500 mg at 03/19/22 0910    ondansetron (ZOFRAN) injection 4 mg  4 mg Intravenous Q8H PRN Blue, Ryann M, NP   4 mg at 03/15/22 1119    PARoxetine (PAXIL) tablet 20 mg  20 mg Oral QAM Cheema, Saima, MD   20 mg at 03/19/22 0900    polyethylene glycol (MIRALAX) packet 17 g  17 g  Oral Daily Caffie Damme, MD   17 g at 03/19/22 0903    senna-docusate (PERICOLACE) 8.6-50 MG per tablet 1 tablet  1 tablet Oral QHS Caffie Damme, MD   1 tablet at 03/18/22 2203    spironolactone (ALDACTONE) tablet 12.5 mg  12.5 mg Oral Daily Marianna Fuss, MD   12.5 mg at 03/19/22 0911    torsemide (DEMADEX) tablet 20 mg  20 mg Oral Q24H Marianna Fuss, MD   20 mg at 03/18/22 1435    torsemide (DEMADEX) tablet 40 mg  40 mg Oral Daily Marianna Fuss, MD   40 mg at 03/19/22 1610    valsartan (DIOVAN) tablet 20 mg  20 mg Oral Q12H SCH Julien Girt, MD             Review of Systems:    Comprehensive review of systems including constitutional, eyes, ears, nose, mouth, throat, cardiovascular, GI, GU, musculoskeletal, integumentary, respiratory, neurologic, psychiatric, and endocrine is negative other than what is mentioned already in the history of present illness    Physical Exam:     Vitals:    03/19/22 0823   BP:    Pulse:    Resp:    Temp:    SpO2: 93%     Temp (24hrs), Avg:97.9 F (36.6 C), Min:97.4 F (36.3 C), Max:98.5 F (36.9 C)      Intake and Output Summary (Last 24 hours) at Date Time    Intake/Output Summary (Last 24 hours) at 03/19/2022 0934  Last data filed at 03/19/2022 9604  Gross per 24 hour   Intake 1546  ml   Output 2110 ml   Net -564 ml       GENERAL: Patient is in no acute distress   HEENT: Moist mucous membranes   NECK: No jugular venous distention  CARDIAC: Regular rate and rhythm, with normal S1 and S2, and no murmurs,  CHEST: Clear to auscultation bilaterally, normal respiratory effort  ABDOMEN: Soft, nontender, non-distended, good bowel sounds   EXTREMITIES: RLE +1 edema, +scrotal edema, 2+ radial pulses palpable   SKIN: No rash or jaundice   NEUROLOGIC: Alert and oriented to time, place and person, normal mood and affect    MUSCULOSKELETAL: Normal muscle strength and tone.      Labs Reviewed:                   Recent Labs   Lab 03/19/22  0619   Bilirubin, Total 0.8   Protein, Total  6.4   Albumin 2.3*   ALT 12   AST (SGOT) 13     Recent Labs   Lab 03/19/22  0619   Magnesium 2.1     Recent Labs   Lab 03/18/22  0029   PT 15.6*   PT INR 1.3*   PTT 34     Recent Labs   Lab 03/19/22  0619 03/18/22  0029 03/17/22  0240   WBC 53.42* 49.06* 43.10*   Hgb 10.7* 10.6* 10.2*   Hematocrit 32.9* 32.8* 31.8*   Platelets 208 207 197     Recent Labs   Lab 03/19/22  0619 03/18/22  0029 03/17/22  0240   Sodium 141 135 130*   Potassium 4.9 4.8 4.6   Chloride 105 102 100   CO2 24 23 20    BUN 40.0* 52.0* 58.0*   Creatinine 1.5 1.6* 1.6*   eGFR 48.0* 44.4* 44.4*   Glucose 106* 192* 202*   Calcium 8.3 8.0 8.1         Radiology   Radiological Procedure reviewed.      chest X-ray  Assessment:   Recent admission for acute on chronic systolic CHF and scrotal edema  EF 25 to 30% by echo August 2022.  Moderate tricuspid and mitral regurgitation present.  PA pressures were 54 mmHg.   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 PTA (he was previously on Entresto 97/103mg  BID)   Continue Torsemide 40mg  in AM and 20mg  in afternoon   Will reach out to [place] regarding CLL and therapy for possibly reducing lymph nodes in attempt to alleviate scrotal edema       Signed by: Conception Oms, NP    I have personally interviewed and examined the patient.  I have reviewed the provider's history, exam, assessment and management plans. I concur with or have edited all elements of the provider's note.      GENERAL: Patient is in no acute distress   HEENT: Moist mucous membranes   NECK: No jugular venous distention  CARDIAC: Regular rate and rhythm, with normal S1 and S2, and no murmurs,  CHEST: Clear to auscultation bilaterally, normal  respiratory effort  ABDOMEN: Soft, nontender, non-distended, good bowel sounds   EXTREMITIES: RLE +1 edema, +  scrotal edema, 2+ radial pulses palpable   SKIN: No rash or jaundice   NEUROLOGIC: Alert and oriented to time, place and person, normal mood and affect    MUSCULOSKELETAL: Normal muscle strength and tone.    Jarrett Soho, MD [facility]  Advanced HF and Transplant Consult Attending

## 2022-03-20 LAB — CBC
Absolute NRBC: 0 10*3/uL (ref 0.00–0.00)
Hematocrit: 31.5 % — ABNORMAL LOW (ref 37.6–49.6)
Hgb: 10 g/dL — ABNORMAL LOW (ref 12.5–17.1)
MCH: 33.9 pg — ABNORMAL HIGH (ref 25.1–33.5)
MCHC: 31.7 g/dL (ref 31.5–35.8)
MCV: 106.8 fL — ABNORMAL HIGH (ref 78.0–96.0)
MPV: 11.2 fL (ref 8.9–12.5)
Nucleated RBC: 0 /100 WBC (ref 0.0–0.0)
Platelets: 189 10*3/uL (ref 142–346)
RBC: 2.95 10*6/uL — ABNORMAL LOW (ref 4.20–5.90)
RDW: 14 % (ref 11–15)
WBC: 54.73 10*3/uL — ABNORMAL HIGH (ref 3.10–9.50)

## 2022-03-20 LAB — BASIC METABOLIC PANEL
Anion Gap: 10 (ref 5.0–15.0)
BUN: 35 mg/dL — ABNORMAL HIGH (ref 9.0–28.0)
CO2: 25 mEq/L (ref 17–29)
Calcium: 8.1 mg/dL (ref 7.9–10.2)
Chloride: 105 mEq/L (ref 99–111)
Creatinine: 1.6 mg/dL — ABNORMAL HIGH (ref 0.5–1.5)
Glucose: 158 mg/dL — ABNORMAL HIGH (ref 70–100)
Potassium: 5.2 mEq/L (ref 3.5–5.3)
Sodium: 140 mEq/L (ref 135–145)
eGFR: 44.4 mL/min/{1.73_m2} — AB (ref >=60)

## 2022-03-20 LAB — GLUCOSE WHOLE BLOOD - POCT
Whole Blood Glucose POCT: 145 mg/dL — ABNORMAL HIGH (ref 70–100)
Whole Blood Glucose POCT: 223 mg/dL — ABNORMAL HIGH (ref 70–100)
Whole Blood Glucose POCT: 203 mg/dL — ABNORMAL HIGH (ref 70–100)

## 2022-03-20 LAB — MAGNESIUM: Magnesium: 1.9 mg/dL (ref 1.6–2.6)

## 2022-03-20 LAB — PHOSPHORUS: Phosphorus: 3.4 mg/dL (ref 2.3–4.7)

## 2022-03-20 MED ORDER — VALSARTAN 40 MG PO TABS
20.0000 mg | ORAL_TABLET | Freq: Two times a day (BID) | ORAL | 2 refills | Status: DC
Start: 2022-03-20 — End: 2022-04-02

## 2022-03-20 MED ORDER — APIXABAN 5 MG PO TABS
10.0000 mg | ORAL_TABLET | Freq: Two times a day (BID) | ORAL | 2 refills | Status: DC
Start: 2022-03-20 — End: 2022-09-14

## 2022-03-20 MED ORDER — METOPROLOL SUCCINATE ER 25 MG PO TB24
12.5000 mg | ORAL_TABLET | Freq: Every day | ORAL | 2 refills | Status: DC
Start: 2022-03-21 — End: 2022-05-22

## 2022-03-20 MED ORDER — AMOXICILLIN-POT CLAVULANATE 500-125 MG PO TABS
1.0000 | ORAL_TABLET | Freq: Two times a day (BID) | ORAL | 0 refills | Status: AC
Start: 2022-03-20 — End: 2022-03-25

## 2022-03-20 MED ORDER — APIXABAN 5 MG PO TABS
5.0000 mg | ORAL_TABLET | Freq: Two times a day (BID) | ORAL | 2 refills | Status: DC
Start: 2022-03-20 — End: 2022-03-20

## 2022-03-20 NOTE — Plan of Care (Signed)
Problem: Everyday - Heart Failure  Goal: Stable Vital Signs and Fluid Balance  Outcome: Progressing  Flowsheets (Taken 03/20/2022 0133)  Stable Vital Signs and Fluid Balance:   Daily Standing Weights in the morning using the same scale, after using the bathroom and before breadfast.  If unable to stand, zero the bed and use the bed scale   Monitor, assess vital signs and telemetry per policy   Fluid Restriction   Assess for swelling/edema   Wean oxygen as needed if appropriate   Monitor labs and report abnormalities to physician   Strict Intake/Output  Goal: Mobility/Activity is Maintained at Optimal Level for Patient  Outcome: Progressing  Flowsheets (Taken 03/20/2022 0133)  Mobility/Activity is Maintained at Optimal Level for Patient:   Reposition patient every 2 hours and as needed unless able to reposition self   Increase mobility as tolerated/progressive mobility protocol   Maintain SCD's as Ordered   Assess for changes in respiratory status, level of consciousness and/or development of fatigue   Consult with Physical Therapy and/or Occupational Therapy   Perform active/passive ROM  Goal: Nutritional Intake is Adequate  Outcome: Progressing  Flowsheets (Taken 03/20/2022 0133)  Nutritional Intake is Adequate:   Cardiac diet-2 gm Sodium   Fluid Restricction if needed   Patient and family teaching on low sodium diet   Encourage/perform oral hygiene as appropriate   Assess appetite,anorexia and amount of meal/food tolerated   Consult/Collaborate with Nutritionist  Goal: Teaching-Using CHF Warning Zones and Educational Videos  Outcome: Progressing  Flowsheets (Taken 03/20/2022 0133)  Teaching-Using CHF Warning Zones and Educational Videos:   Signs & Symptoms of CHF   Sodium Restriction   Fluid Restriction if appropriate   Daily Standing Weights & record

## 2022-03-20 NOTE — Progress Notes (Signed)
Infectious Disease            Progress Note    03/20/2022   [name] [name] WGN:[id number],ION:[id number] is a 76 y.o. male, with history significant for diabetes mellitus, hypertension, chronic kidney disease, anxiety, depression, sleep apnea, COPD, hyperlipidemia, congestive heart failure, nonischemic cardiomyopathy, status post AICD placement, CAD with leukocytosis, lymphadenopathy.    Subjective:     [name] [name] today Symptoms: Afebrile, possible discharge today, no new complaints, no vomiting or diarrhea, pain under control. Other review of system is non contributory.    Objective:     Blood pressure 114/65, pulse 81, temperature 97.9 F (36.6 C), temperature source Temporal, resp. rate 17, height 1.829 m (6'), weight 92.4 kg (203 lb 11.2 oz), SpO2 96 %.    General Appearance: Looks comfortable  HEENT: Pallor negative, Anicteric sclera.    Neck:    Supple  Lungs: Decreased breath sound at bases.    Chest Wall: Symmetric chest wall expansion.   Heart : S1 and S2.   Abdomen: Abdomen is soft, bowel sounds positive.  Neurological: Alert and oriented to person, place and time.  Moves all extremities  Extremities: Lower extremity edema  Skin/scrotum: Scrotal swelling and tenderness  Psychiatric: Mood and affect is normal    Laboratory And Diagnostic Studies:     Recent Labs     03/20/22  0601 03/19/22  0619 03/18/22  0029   WBC 54.73* 53.42* 49.06*   Hgb 10.0* 10.7* 10.6*   Hematocrit 31.5* 32.9* 32.8*   Platelets 189 208 207   Segmented Neutrophils  --  27 30       Recent Labs     03/20/22  0601 03/19/22  0619   Sodium 140 141   Potassium 5.2 4.9   Chloride 105 105   CO2 25 24   BUN 35.0* 40.0*   Creatinine 1.6* 1.5   Glucose 158* 106*   Calcium 8.1 8.3         Current Med's:     Current Facility-Administered Medications   Medication Dose Route Frequency    acetaminophen  650 mg Oral Q6H    amoxicillin-clavulanate  1 tablet Oral Q12H SCH    aspirin EC  81 mg Oral Daily    atorvastatin  40 mg Oral QHS     enoxaparin  1 mg/kg Subcutaneous Q12H    fluticasone furoate-vilanterol  1 puff Inhalation QAM    insulin glargine  40 Units Subcutaneous QAM    insulin lispro  1-6 Units Subcutaneous QHS    insulin lispro  2-10 Units Subcutaneous TID AC    melatonin  3 mg Oral QHS    metoprolol succinate XL  12.5 mg Oral Daily    miconazole 2 % with zinc oxide   Topical BID    niacin  500 mg Oral BID    PARoxetine  20 mg Oral QAM    polyethylene glycol  17 g Oral Daily    senna-docusate  1 tablet Oral QHS    spironolactone  12.5 mg Oral Daily    torsemide  20 mg Oral Q24H    torsemide  40 mg Oral Daily    valsartan  20 mg Oral Q12H SCH       Lines/Drains:     Patient Lines/Drains/Airways Status       Active Lines, Drains and Airways       Name Placement date Placement time Site Days    Peripheral IV 03/14/22 20 G Anterior;Distal;Right Forearm 03/14/22  0400  Forearm  6                    Assessment:     Condition: Guarded  Lower extremity cellulitis  Retroperitoneal, inguinal, mesenteric lymphadenopathy  CLL  Congestive heart failure  Cardiomyopathy  S/p ICD placement  Diabetes mellitus  Left hydrocele  Chronic kidney disease  Hypertension  Coronary artery disease  Chronic obstructive pulmonary disease  Hyperlipidemia  Sleep apnea  Depression    Plan:     Continue Augmentin  Hematology follow-up  Cardiology follow-up  Correction of electrolytes  Urology follow-up  Discussed with treatment team  Can be discharged from ID perspective with close monitoring and follow-ups              Alfonzo Beers, MD, M.D.,FACP  03/20/2022  8:44 AM          *This note was generated by the Epic EMR system/ Dragon speech recognition and may contain inherent errors or omissions not intended by the user. Grammatical errors, random word insertions, deletions, pronoun errors and incomplete sentences are occasional consequences of this technology due to software limitations. Not all errors are caught or corrected. If there are questions or concerns  about the content of this note or information contained within the body of this dictation they should be addressed directly with the author for clarification

## 2022-03-20 NOTE — Consults (Signed)
[facility]   ADVANCED HEART FAILURE CONSULTATION REPORT  [facility]    Date Time: 03/20/22 9:44 AM  Patient Name: [name] [name]  Requesting Physician: Julien Girt, MD       Reason for Consultation:   CHF    History:   [name] [name] is a 76 y.o. male admitted on 03/09/2022.  We have been asked by Julien Girt, MD,  to provide cardiac consultation, regarding CHF.      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  For HF GDMT, he is on carvedilol 25 mg twice daily, Entresto 97/23 mg twice daily, Jardiance 10 mg daily, and spironolactone 12.5 mg daily in the outpatient setting.  He has a Medtronic ICD in place.    He was initially seen by urology couple weeks prior for severe scrotal edema.  He also mentioned that he had lower extremity edema as well.  Eventually was admitted for worsening swelling and shortness of breath.  He has been treated with IV Lasix 80 mg twice daily dosing.    Chest x-ray showed no acute cardiopulmonary process.  He had an ultrasound of the scrotum and testicles, showed moderate size, left high steel scrotal wall edema.  Past couple days, he has noticed improvement in his breathing and lower extremity edema.  However, he still has some lingering scrotal edema that is not quite back to baseline.    Seen by urology, who did not recommend surgery at this time.  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  Per Dr. Laurine Blazer from [facility] there was discussion regarding treatment with BTK inhibitors or venetoclax.    He sees urologist Dr. Edwyna Shell in the outpatient setting.  He was also found to have right lower extremity DVT. Now on lovenox. ID following for LLE cellulitis.       Past Medical History:     Past Medical History:   Diagnosis Date    Acute CHF     NOV  & DEC 2018, - DIALYSIS CATH PLACED Aug 24 2017 TO REMOVE FLUID     Acute systolic (congestive) heart failure 08/2017     Anxiety     CAD (coronary artery disease)     Cardiomyopathy     nonischemic    CHF (congestive heart failure) 08/12/2014, 2013    Chronic obstructive pulmonary disease     POSSIBLE PER PT HE USES  SYNBICORT BID ABD  BREO INHALER PRN    Coronary artery disease 2011    CORONARY STENT PLACEMENT FOLLOWED BY [facility]    Depression     echocardiogram 02/2016, 11/2016, 09/2017, 12/20/2017    GERD (gastroesophageal reflux disease)     Heart attack 2011    and 2014    Hyperlipemia     Hypertension     ICD (implantable 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     PLACED 2014  [facility]  LAST INTERROGATION April 01 2018 REPORT REQUESTED    Ischemic cardiomyopathy     EF 15% ON ECHO 09-20-2017 DR GARG IN EPIC    Nonischemic cardiomyopathy     NSTEMI (non-ST elevated myocardial infarction)     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    Pacemaker 2014    MEDTRONIC ICD/PACEMAKER COMBO LAST INTERROGATION  12-12 2018    Pneumonia 09/2016    Primary cardiomyopathy     Ischemic cardiomyopathy EF 15% ON ECHO 09-20-2017 DR GARG IN EPIC  Sepsis 08/2017    Syncope and collapse     PRIOR TO ICD/PACEMAKER PLACED 2014    Type 2 diabetes mellitus, controlled DX 1998    BS  AVG 100  A1C 7.4 Dec 27 2017    [place]     PT SEE HIS PMD April 01 2018 RE THIS-TOLD TO SEE PMD BY [facility] JUNE 12 WHEN THEY SAW HIM FOR A CL       Past Surgical History:     Past Surgical History:   Procedure Laterality Date    BIV  11/10/2016    ICD METRONIC  UPGRADE     CARDIAC CATHETERIZATION  03/2010    CORONARY STENT PLACEMENT X 2 PER PT; LM normal, LAD 70-80% distal lesion at apex, 20% lesion mid CFX    CARDIAC CATHETERIZATION  12/2012    CARDIAC DEFIBRILLATOR PLACEMENT  12/2012    [facility]    CARDIAC PACEMAKER PLACEMENT  2014    MEDTRONIC ICD/PACEMAKER PLACED [facility]    CIRCUMCISION  AGE 45    COLONOSCOPY  2009    CORRECTION HAMMER TOE      duodenal ulcer  1973    STRESS RELATED IN SCHOOL    ECHOCARDIOGRAM, TRANSTHORACIC  02/2016 11/2016 09/2017 12/2017    ECHOCARDIOGRAM, TRANSTHORACIC   02/2016,11/2016,12/20/2017    EGD N/A 08/15/2014    Procedure: EGD;  Surgeon: Colon Branch, MD;  Location: [facility];  Service: Gastroenterology;  Laterality: N/A;  egd w/ bx    EGD  1975    EGD, COLONOSCOPY N/A 04/04/2018    Procedure: EGD with bxs, COLONOSCOPY with polypectomy and clipping;  Surgeon: Doyne Keel, MD;  Location: [facility];  Service: Gastroenterology;  Laterality: N/A;    FRACTURE SURGERY  AGE 27     RT  ANKLE- CAST APPLIED    HERNIA REPAIR  AGE 45    LEFT INGUINAL HERNIA    MPI nuclear study  06/2016    orthopedic surgery  AGE 58    RT foot corrective - HAMMERTOE    TONSILLECTOMY AND ADENOIDECTOMY  1954       Family History:     Family History   Problem Relation Age of Onset    Breast cancer Mother     Heart attack Mother 53    Hypertension Mother     Diabetes Mother     Coronary artery disease Mother     Diabetes Brother     Heart attack Father     Hypertension Father        Social History:     Social History     Socioeconomic History    Marital status: Married     Spouse name: Teacher, music    Number of children: 0    Years of education: Not on file    Highest education level: Not on file   Occupational History    Occupation: Runner, 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, [place]    Tobacco Use    Smoking status: Never    Smokeless tobacco: Never   Vaping Use    Vaping status: Never Used   Substance and Sexual Activity    Alcohol use: No     Alcohol/week: 0.0 standard drinks of alcohol     Comment: occasionally    Drug use: No    Sexual activity: Yes     Partners: Female   Other Topics Concern    Not on file   Social History Narrative    Not  on file     Social Determinants of Health     Financial Resource Strain: Not on file   Food Insecurity: Not on file   Transportation Needs: Not on file   Physical Activity: Not on file   Stress: Not on file   Social Connections: Not on file   Intimate Partner Violence: Not on file   Housing Stability: Not on file       Allergies:   No Known  Allergies    Medications:     Medications Prior to Admission   Medication Sig    aspirin EC 81 MG EC tablet Take 1 tablet (81 mg) by mouth daily    atorvastatin (LIPITOR) 40 MG tablet Take 1 tablet (40 mg total) by mouth nightly    carvedilol (COREG) 25 MG tablet Take 1 tablet (25 mg total) by mouth 2 (two) times daily with meals    empagliflozin (JARDIANCE) 10 MG tablet Take 1 tablet (10 mg total) by mouth daily    fluticasone-salmeterol (ADVAIR DISKUS) 100-50 MCG/ACT Aerosol Pwdr, Breath Activated Inhale 1 puff into the lungs 2 (two) times daily    glucose blood (ONE TOUCH ULTRA TEST) test strip 1 each by Other route 2 (two) times daily Use as instructed    hydrocodone-chlorpheniramine (TUSSIONEX) 10-8 MG/5ML extended-release suspension Take 5 mLs by mouth 2 (two) times daily    insulin glargine (LANTUS SOLOSTAR) 100 UNIT/ML injection pen Patient takes 50 units at night    Insulin Pen Needle (BD Pen Needle Nano U/F) 32G X 4 MM Misc Use as directed bid    metOLazone (ZAROXOLYN) 2.5 MG tablet Take 1 tablet (2.5 mg total) by mouth once a week    niacin (NIASPAN) 500 MG CR tablet TAKE 1 TABLET BY MOUTH TWICE DAILY    PARoxetine (Paxil) 20 MG tablet Take 1 tablet (20 mg total) by mouth every morning    sacubitril-valsartan (Entresto) 97-103 MG Tab per tablet Take 1 tablet by mouth 2 (two) times daily    Semglee, yfgn, 100 UNIT/ML Solution Pen-injector ADMINISTER 50 UNITS UNDER THE SKIN AT BEDTIME    SITagliptin-metFORMIN (Janumet) 50-500 MG tablet Take 1 tablet by mouth 2 (two) times daily with meals    spironolactone (ALDACTONE) 25 MG tablet Take 0.5 tablets (12.5 mg total) by mouth daily    torsemide (Demadex) 20 MG tablet Take 2 tablets (40 mg) by mouth every morning AND 1 tablet (20 mg) every evening.         Current Facility-Administered Medications   Medication Dose Route Frequency Provider Last Rate Last Admin    acetaminophen (TYLENOL) tablet 650 mg  650 mg Oral Q6H Blue, Ryann M, NP   650 mg at 03/20/22 0534     amoxicillin-clavulanate (AUGMENTIN) 500-125 MG per tablet 1 tablet  1 tablet Oral Q12H [facility] Choudhary, Sarfraz A, MD   1 tablet at 03/20/22 0803    aspirin EC tablet 81 mg  81 mg Oral Daily Cheema, Saima, MD   81 mg at 03/20/22 0802    atorvastatin (LIPITOR) tablet 40 mg  40 mg Oral QHS Cheema, Saima, MD   40 mg at 03/19/22 2208    dextrose (GLUCOSE) 40 % oral gel 15 g of glucose  15 g of glucose Oral PRN Cheema, Saima, MD        Or    dextrose (D10W) 10% bolus 125 mL  12.5 g Intravenous PRN Caffie Damme, MD        Or  dextrose 50 % bolus 12.5 g  12.5 g Intravenous PRN Caffie Damme, MD        Or    glucagon (rDNA) (GLUCAGEN) injection 1 mg  1 mg Intramuscular PRN Caffie Damme, MD        enoxaparin (LOVENOX) syringe 90 mg  1 mg/kg Subcutaneous Q12H Heide Spark, Aura Dials, MD   90 mg at 03/19/22 2332    fluticasone furoate-vilanterol (BREO ELLIPTA) 100-25 MCG/ACT 1 puff  1 puff Inhalation QAM Caffie Damme, MD   1 puff at 03/19/22 0822    insulin glargine (LANTUS) injection 40 Units  40 Units Subcutaneous QAM Allegra Grana, MD   40 Units at 03/20/22 0804    insulin lispro injection 1-6 Units  1-6 Units Subcutaneous QHS Caffie Damme, MD   1 Units at 03/19/22 2209    insulin lispro injection 2-10 Units  2-10 Units Subcutaneous TID Remigio Eisenmenger, MD   4 Units at 03/19/22 1620    lidocaine (XYLOCAINE) 4 % external solution   Topical Q6H PRN Caffie Damme, MD   Given at 03/16/22 0404    melatonin tablet 3 mg  3 mg Oral QHS Blue, Ryann M, NP   3 mg at 03/19/22 2209    metoprolol succinate XL (TOPROL-XL) 24 hr tablet 12.5 mg  12.5 mg Oral Daily Marianna Fuss, MD   12.5 mg at 03/20/22 0802    miconazole 2 % with zinc oxide (BAZA/SECURA ANTIFUNGAL) cream   Topical BID Lubertha Basque R, NP   Given at 03/20/22 0804    niacin (NIASPAN) CR tablet 500 mg  500 mg Oral BID Caffie Damme, MD   500 mg at 03/20/22 0804    ondansetron (ZOFRAN) injection 4 mg  4 mg Intravenous Q8H PRN Blue, Ryann M, NP   4 mg at 03/15/22 1119     PARoxetine (PAXIL) tablet 20 mg  20 mg Oral QAM Cheema, Ladell Heads, MD   20 mg at 03/20/22 0803    polyethylene glycol (MIRALAX) packet 17 g  17 g Oral Daily Caffie Damme, MD   17 g at 03/19/22 0903    senna-docusate (PERICOLACE) 8.6-50 MG per tablet 1 tablet  1 tablet Oral QHS Caffie Damme, MD   1 tablet at 03/19/22 2210    torsemide (DEMADEX) tablet 20 mg  20 mg Oral Q24H Marianna Fuss, MD   20 mg at 03/19/22 1600    torsemide (DEMADEX) tablet 40 mg  40 mg Oral Daily Marianna Fuss, MD   40 mg at 03/20/22 0802    valsartan (DIOVAN) tablet 20 mg  20 mg Oral Q12H [facility] Julien Girt, MD   20 mg at 03/20/22 9147         Review of Systems:    Comprehensive review of systems including constitutional, eyes, ears, nose, mouth, throat, cardiovascular, GI, GU, musculoskeletal, integumentary, respiratory, neurologic, psychiatric, and endocrine is negative other than what is mentioned already in the history of present illness    Physical Exam:     Vitals:    03/20/22 0747   BP: 114/65   Pulse: 81   Resp: 17   Temp: 97.9 F (36.6 C)   SpO2: 96%     Temp (24hrs), Avg:97.9 F (36.6 C), Min:97.3 F (36.3 C), Max:98.3 F (36.8 C)      Intake and Output Summary (Last 24 hours) at Date Time    Intake/Output Summary (Last 24 hours) at 03/20/2022 0944  Last data filed at 03/20/2022 8295  Gross  per 24 hour   Intake 682 ml   Output 2650 ml   Net -1968 ml       GENERAL: Patient is in no acute distress   HEENT: Moist mucous membranes   NECK: No jugular venous distention  CARDIAC: Regular rate and rhythm, with normal S1 and S2, and no murmurs,  CHEST: Clear to auscultation bilaterally, normal respiratory effort  ABDOMEN: Soft, nontender, non-distended, good bowel sounds   EXTREMITIES: RLE nonpitting edema, +scrotal edema, 2+ radial pulses palpable   SKIN: No rash or jaundice   NEUROLOGIC: Alert and oriented to time, place and person, normal mood and affect    MUSCULOSKELETAL: Normal muscle strength and tone.      Labs Reviewed:                    Recent Labs   Lab 03/19/22  0619   Bilirubin, Total 0.8   Protein, Total 6.4   Albumin 2.3*   ALT 12   AST (SGOT) 13     Recent Labs   Lab 03/20/22  0601   Magnesium 1.9     Recent Labs   Lab 03/18/22  0029   PT 15.6*   PT INR 1.3*   PTT 34     Recent Labs   Lab 03/20/22  0601 03/19/22  0619 03/18/22  0029   WBC 54.73* 53.42* 49.06*   Hgb 10.0* 10.7* 10.6*   Hematocrit 31.5* 32.9* 32.8*   Platelets 189 208 207     Recent Labs   Lab 03/20/22  0601 03/19/22  0619 03/18/22  0029   Sodium 140 141 135   Potassium 5.2 4.9 4.8   Chloride 105 105 102   CO2 25 24 23    BUN 35.0* 40.0* 52.0*   Creatinine 1.6* 1.5 1.6*   eGFR 44.4* 48.0* 44.4*   Glucose 158* 106* 192*   Calcium 8.1 8.3 8.0         Radiology   Radiological Procedure reviewed.      chest X-ray  Assessment:   Recent admission for acute on chronic systolic CHF and scrotal edema  EF 25 to 30% by echo August 2022.  Moderate tricuspid and mitral regurgitation present.  PA pressures were 54 mmHg.   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 Suspect the scrotal edema will improve/resolve once enlarged lymph nodes are treated   Stable from cardiac perspective for discharge. Will arrange close follow up next week.       Signed by: Conception Oms, NP    I have personally interviewed and examined the patient.  I have reviewed the provider's history, exam, assessment and management plans. I concur with or have edited all elements of the provider's note.      GENERAL: Patient is in no acute distress   HEENT: Moist mucous  membranes   NECK: No jugular venous distention  CARDIAC: Regular rate and rhythm, with normal S1 and S2, and no murmurs,  CHEST: Clear to  auscultation bilaterally, normal respiratory effort  ABDOMEN: Soft, nontender, non-distended, good bowel sounds   EXTREMITIES: RLE nonpitting edema, +scrotal edema, 2+ radial pulses palpable   SKIN: No rash or jaundice   NEUROLOGIC: Alert and oriented to time, place and person, normal mood and affect    MUSCULOSKELETAL: Normal muscle strength and tone.    Jarrett Soho, MD [facility]  Advanced HF and Transplant Consult Attending

## 2022-03-20 NOTE — Discharge Summary (Signed)
[facility] HOSPITALIST Discharge Summary  Patient Info:   Date/Time: 03/20/2022 / 3:43 PM   Admit Date:03/09/2022  Patient Name:[name] [name]   NWG:[id number]   PCP: Zorita Pang, MD  Attending Physician:Adi Viona [facility], MD    Hospital Course:   Please see H&P for complete details of HPI and ROS. The patient was admitted   to [facility] and has been taken care as mentioned below.  76 year old gentleman admitted on 5/22 with dyspnea, orthopnea, scrotal swelling.  S/P IMCU transfer on 5/25 secondary to hypotension.  Transferred back to the floor 5/30.        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  High sensitivity troponin negative x 3.  NT proBNP on admission elevated at 4091.  CXR on admission shows no acute process.  CT abdomen/pelvis on 5/24 shows retroperitoneal, inguinal and mesenteric adenopathy, moderate scrotal wall thickening.  Recent echo on 05/29/21 shows EF of 25-30%, grade III diastolic dysfunction, moderately dilated LA, moderate MR, moderate TR, moderate pulmonary hypertension.       S/P IV lasix.  Noted to by hypotensive on 5/25, possibly due to overdiuresis.  S/P levophed infusion, which was stopped on 5/27.  Oral lasix resumed 5/28.  Home torsemide regimen resumed 5/30, which includes torsemide 40 mg in AM, 20 mg in PM.    Seen by advanced heart failure team    -Restarted on low-dose Toprol, Diovan-BP remains stable  -Aldactone discontinued mild hyperkalemia  -Continue torsemide  -Follow-up with advanced heart failure team as outpatient        AKI on CKD stage 3  Likely in setting of hypotension hypoperfusion  Renal function back to baseline     RLE DVT  Venous dopplers on 5/30 show occlusive thrombus in the right common femoral vein and distal right external iliac vein.   Started on therapeutic Lovenox  Discussed with the hematology team, okay to switch to [facility] upon discharge.  Will discharge on  Eliquis       LLE cellulitis  S/P rocephin.    Discharged on oral Augmentin     Moderate complex left hydrocele with septations  Noted on scrotal US from 5/19.  Scrotal elevation, scrotal support, mobilization recommended.  Scrotal edema likely secondary to CLL and CHF.  No surgery at this time.  Follow up with urology as outpatient, Dr. Edwyna Shell, for moderate complex left hydrocele.       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  Not on current therapy  Discussed with oncology Dr.Dar.  Patient to follow-up as outpatient to initiate the treatment     Type 2 DM  HbA2c of 5.4 on 5/26.  Continue lantus  Resume oral medications     CAD  Nonobstructive.  Continue aspirin, lipitor     Macrocytic anemia  Stable H&H     COPD  Without exacerbation.    Continue outpatient inhaler      Depression  Continue paxil      Patient has BMI=Body mass index is 27.63 kg/m.  Diagnosis: Overweight based on BMI criteria     Disposition:home  Condition at Discharge and Prognosis: Fair  Admission Date:03/09/2022  Discharge Date: 03/20/22  Type of Admission:Inpatient   Code Status: Full Code  Subjective:   Feels better.  Shortness of breath improving  Chief Complaint:  Shortness of Breath, Groin Swelling, and Leg Swelling      Objective:  Vitals:    03/20/22 0747 03/20/22 1150 03/20/22 1426 03/20/22 1542   BP: 114/65 107/71 97/65 105/63   Pulse: 81 86  92   Resp: 17 17  17    Temp: 97.9 F (36.6 C) 98 F (36.7 C)  97.2 F (36.2 C)   TempSrc: Temporal Temporal  Temporal   SpO2: 96% 94%  94%   Weight:       Height:           Clinical Presentation:     History of Presenting Illness: Please refer to HPI in the Detailed H&P         Discharge Medication List        Taking      amoxicillin-clavulanate 500-125 MG per tablet  Dose: 1 tablet  Commonly known as: AUGMENTIN  Take 1 tablet by mouth every 12 (twelve) hours for 5 days     apixaban 5 MG  Dose: 5 mg  Commonly known as: ELIQUIS  Take 1 tablet (5 mg) by mouth every 12 (twelve) hours      aspirin EC 81 MG EC tablet  Dose: 81 mg  Take 1 tablet (81 mg) by mouth daily     atorvastatin 40 MG tablet  Dose: 40 mg  Commonly known as: LIPITOR  Take 1 tablet (40 mg total) by mouth nightly     BD Pen Needle Nano U/F 32G X 4 MM Misc  Generic drug: Insulin Pen Needle  Use as directed bid     empagliflozin 10 MG tablet  Dose: 10 mg  Commonly known as: JARDIANCE  Take 1 tablet (10 mg total) by mouth daily     fluticasone-salmeterol 100-50 MCG/ACT Aepb  Dose: 1 puff  Commonly known as: ADVAIR DISKUS  Inhale 1 puff into the lungs 2 (two) times daily     glucose blood test strip  Dose: 1 each  Commonly known as: ONE TOUCH ULTRA TEST  1 each by Other route 2 (two) times daily Use as instructed     insulin glargine 100 UNIT/ML injection pen  Patient takes 50 units at night     Janumet 50-500 MG tablet  Dose: 1 tablet  Generic drug: SITagliptin-metFORMIN  Take 1 tablet by mouth 2 (two) times daily with meals     metoprolol succinate XL 25 MG 24 hr tablet  Dose: 12.5 mg  Commonly known as: TOPROL-XL  Start taking on: March 21, 2022  Take 0.5 tablets (12.5 mg) by mouth daily     niacin 500 MG CR tablet  Commonly known as: NIASPAN  TAKE 1 TABLET BY MOUTH TWICE DAILY     PARoxetine 20 MG tablet  Dose: 20 mg  Commonly known as: Paxil  Take 1 tablet (20 mg total) by mouth every morning     Semglee (yfgn) 100 UNIT/ML Sopn  Generic drug: Insulin Glargine-yfgn  ADMINISTER 50 UNITS UNDER THE SKIN AT BEDTIME     torsemide 20 MG tablet  Commonly known as: Demadex  Take 2 tablets (40 mg) by mouth every morning AND 1 tablet (20 mg) every evening.     valsartan 40 MG tablet  Dose: 20 mg  Commonly known as: DIOVAN  Take 0.5 tablets (20 mg) by mouth every 12 (twelve) hours            STOP taking these medications      carvedilol 25 MG tablet  Commonly known as: COREG     Entresto 97-103 MG Tabs per tablet  Generic  drug: sacubitril-valsartan     hydrocodone-chlorpheniramine 10-8 MG/5ML extended-release suspension  Commonly known as:  TUSSIONEX     metOLazone 2.5 MG tablet  Commonly known as: ZAROXOLYN     spironolactone 25 MG tablet  Commonly known as: ALDACTONE              Follow up recommendations:   Follow up:    Follow-up Information       Zorita Pang, MD Follow up in 1 week(s).    Specialty: Family Medicine  Contact information:  [phone]  [address]  [place] [address]  [phone]               Marianna Fuss, MD. Schedule an appointment as soon as possible for a visit.    Specialties: Advanced Heart Failure and Transplant Cardiology, Cardiology  Contact information:  [phone] [facility]  [address]  [place] [address]  [phone]               Marvell Fuller, MD. Schedule an appointment as soon as possible for a visit.    Specialties: Medical Oncology, Internal Medicine  Contact information:  [address]  [address]  [place] [address]  [phone]               Don Broach, MD. Schedule an appointment as soon as possible for a visit.    Specialty: Urology  Contact information:  [address]  [address] [address]  [phone]                              Results of Labs/imaging:   Labs have been reviewed:   Coagulation Profile:   Recent Labs   Lab 03/18/22  0029   PT 15.6*   PT INR 1.3*   PTT 34       CBC review:   Recent Labs   Lab 03/20/22  0601 03/19/22  0619 03/18/22  0029 03/17/22  0240 03/16/22  0319 03/15/22  0138 03/14/22  0424   WBC 54.73* 53.42* 49.06* 43.10* 37.78* 34.07* 30.56*   Hgb 10.0* 10.7* 10.6* 10.2* 11.0* 11.5* 11.9*   Hematocrit 31.5* 32.9* 32.8* 31.8* 33.1* 34.7* 35.5*   Platelets 189 208 207 197 202 186 166   MCV 106.8* 104.8* 104.5* 103.9* 102.5* 102.1* 103.2*   RDW 14 14 14 14 14 14 14    Segmented Neutrophils  --  27 30  --  14 10 18      Chem Review:  Recent Labs   Lab 03/20/22  0601 03/19/22  0619 03/18/22  0029 03/17/22  0240 03/16/22  0319   Sodium 140 141 135 130* 131*   Potassium 5.2 4.9 4.8 4.6 4.6   Chloride 105 105 102 100 100   CO2 25 24 23 20 21    BUN 35.0* 40.0* 52.0* 58.0* 60.0*    Creatinine 1.6* 1.5 1.6* 1.6* 1.9*   Glucose 158* 106* 192* 202* 238*   Calcium 8.1 8.3 8.0 8.1 8.0   Magnesium 1.9 2.1 2.4 2.5 2.4   Phosphorus 3.4 4.0 3.4 3.4 3.5   Bilirubin, Total  --  0.8  --   --   --    AST (SGOT)  --  13  --   --   --    ALT  --  12  --   --   --    Alkaline Phosphatase  --  66  --   --   --  Results       Procedure Component Value Units Date/Time    Glucose Whole Blood - POCT [[id number]]  (Abnormal) Collected: 03/20/22 1148     Updated: 03/20/22 1151     Whole Blood Glucose POCT 223 mg/dL     Glucose Whole Blood - POCT [[id number]]  (Abnormal) Collected: 03/20/22 0742     Updated: 03/20/22 0748     Whole Blood Glucose POCT 145 mg/dL     Basic Metabolic Panel [[id number]]  (Abnormal) Collected: 03/20/22 0601    Specimen: Blood Updated: 03/20/22 0641     Glucose 158 mg/dL      BUN 44.0 mg/dL      Creatinine 1.6 mg/dL      Calcium 8.1 mg/dL      Sodium 347 mEq/L      Potassium 5.2 mEq/L      Chloride 105 mEq/L      CO2 25 mEq/L      Anion Gap 10.0     eGFR 44.4 mL/min/1.73 m2     Narrative:      If heparin infusion is on [facility] hold do not draw this lab    Magnesium [[id number]] Collected: 03/20/22 0601    Specimen: Blood Updated: 03/20/22 0641     Magnesium 1.9 mg/dL     Narrative:      If heparin infusion is on [facility] hold do not draw this lab    Phosphorus [[id number]] Collected: 03/20/22 0601    Specimen: Blood Updated: 03/20/22 0641     Phosphorus 3.4 mg/dL     Narrative:      If heparin infusion is on [facility] hold do not draw this lab    CBC without differential [[id number]]  (Abnormal) Collected: 03/20/22 0601    Specimen: Blood Updated: 03/20/22 0620     WBC 54.73 x10 3/uL      Hgb 10.0 g/dL      Hematocrit 01.6 %      Platelets 189 x10 3/uL      RBC 2.95 x10 6/uL      MCV 106.8 fL      MCH 33.9 pg      MCHC 31.7 g/dL      RDW 14 %      MPV 11.2 fL      Nucleated RBC 0.0 /100 WBC      Absolute NRBC 0.00 x10 3/uL     Narrative:      If heparin infusion is on MAR hold do not draw this lab     Glucose Whole Blood - POCT [[id number]]  (Abnormal) Collected: 03/19/22 2108     Updated: 03/19/22 2116     Whole Blood Glucose POCT 200 mg/dL     Glucose Whole Blood - POCT [[id number]]  (Abnormal) Collected: 03/19/22 1610     Updated: 03/19/22 1613     Whole Blood Glucose POCT 206 mg/dL           Radiology reports have been reviewed:  Radiology Results (24 Hour)       ** No results found for the last 24 hours. **          CT Chest WO Contrast    Result Date: 03/18/2022  HISTORY:  CLL. COMPARISON: 08/22/2017. TECHNIQUE: CT of the chest performed without intravenous contrast. The following dose reduction techniques were utilized: automated exposure control and/or adjustment of the mA and/or KV according to patient size, and the use of an iterative reconstruction technique. FINDINGS: Axillary, mediastinal and supraclavicular adenopathy  is present. The hila are poorly visualized without contrast. An index right axillary node measures 2.2 x 1.6 cm (series 5, image 29). The mediastinal nodes are subcentimeter but markedly increased in number. The heart is mildly enlarged with a trace left effusion. No pericardial or right pleural fluid is present. There is mild bronchiectasis with scattered subcentimeter granulomata. No suspicious lung mass, pneumonia or pneumothorax is present.      Thoracic adenopathy and trace left effusion. Bosie Helper, MD 03/18/2022 5:38 PM    CT Soft Tissue Neck WO Contrast    Result Date: 03/18/2022  HISTORY: Leukemia, staging. COMPARISON: None available. TECHNIQUE: CT of the neck performed without intravenous contrast. Multiplanar reformatted images were created and reviewed. The following dose reduction techniques were utilized: automated exposure control and/or adjustment of the mA and/or KV according to patient size, and the use of an iterative reconstruction technique. FINDINGS: Numerous lymph nodes are seen along the jugular chains, and the submandibular regions, the posterior triangles on both  sides. This is somewhat poorly defined without intravenous contrast. The lymph nodes are overall small with a representative larger right level 2 lymph node measuring 1.4 x 0.6 cm in axial plane. Some larger lymph nodes are seen more inferiorly within the neck with a larger left supraclavicular lymph node measuring 1.9 x 1.3 cm in axial plane. The pharynx, larynx, the major salivary glands, the thyroid gland appear unremarkable. The visualized portions of the brain parenchyma are unremarkable as well. There are advanced degenerative changes in the cervical spine resulting in multilevel spinal canal and neural foraminal narrowing.      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 Terrilee Croak, MD 03/18/2022 5:37 PM    US Venous Duplex Doppler Leg Bilateral    Result Date: 03/17/2022  HISTORY: Right greater than left leg swelling] pain. COMPARISON: CT abdomen pelvis 03/11/2022. TECHNIQUE: Wallace Cullens scale, color flow, and spectral Doppler waveform analysis was performed on the bilateral lower extremity veins described below. There is normal compressibility, phasic flow, and response to augmentation unless otherwise noted. FINDINGS: Right lower extremity: There is occlusive thrombus in the right common femoral vein and and distal right external iliac vein. The right femoral, popliteal, posterior tibial and peroneal veins are patent. Left lower extremity: The left common femoral, proximal profunda femoris, proximal greater saphenous vein are patent. The left femoral, popliteal, posterior tibial and peroneal veins are patent. Redemonstration of enlarged bilateral inguinal lymph nodes, measuring up to 5.8 x 2.0 x 3.8 cm on the left.     1.Occlusive thrombus in the right common femoral vein and distal right external iliac vein. Extension of thrombus to the right common iliac vein cannot be assessed by ultrasound. If needed, further evaluation with CT venogram abdomen and pelvis can  be performed. 2.No deep venous thrombosis in the left lower extremity. 3.Bilateral inguinal lymphadenopathy redemonstrated. These critical results were discussed with and acknowledged by Dr. Shirline Frees on 03/17/2022 at 10:49 PM.  Nonda Lou, MD 03/17/2022 10:54 PM    XR Chest AP Portable    Result Date: 03/12/2022  HISTORY: Chest pain COMPARISON: Chest radiograph 03/09/2022 FINDINGS: LUNGS and PLEURA: No acute consolidation. No pleural effusion or pneumothorax. HEART and MEDIASTINUM:  Stable cardiomediastinal silhouette. Left chest wall cardiac pacemaker/defibrillator with leads terminating in the region of the right atrium, right ventricle and posterior cardiac vein.     No acute abnormality. Debera Lat Merchant 03/12/2022 4:11 AM    CT Abdomen Pelvis WO IV/ WO PO Cont  Result Date: 03/11/2022  HISTORY:  Leukocytosis. Scrotal swelling. COMPARISON: 02/17/2021. TECHNIQUE: CT of the abdomen and pelvis performed without intravenous contrast. The following dose reduction techniques were utilized: automated exposure control and/or adjustment of the mA and/or KV according to patient size, and the use of an iterative  reconstruction technique. FINDINGS: Please note that evaluation of the viscera and vasculature is limited in the absence of IV contrast.  There is minimal atelectasis at the lung bases. Mild bronchiectasis present in the bilateral lower lobes. No calcified gallstones. The liver, spleen, adrenal glands pancreas are unremarkable on this noncontrast study. There is bilateral perinephric stranding. There is stranding along the right pelvic sidewall.. There is no hydronephrosis. No renal, ureteral bladder calculi are seen. Bilateral renal cysts are present. There is marked retroperitoneal, pelvic and inguinal adenopathy. Moderate mesenteric adenopathy is present. Right pelvic sidewall lymph node measures 7.7 x 5.9 cm (series 7 image 133). Right inguinal lymph node measures 2.9 x 3.8 cm (series 4 image 167).  Mesenteric  lymph node measures 4.0 x 1.7 cm (series 4 image 77). Bladder is mildly distended. The prostate is mildly enlarged. There is moderate anasarca. There is moderate scrotal wall thickening. Degenerative changes are present in the lumbar spine. Small bilateral fat-containing inguinal hernias.     1. Retroperitoneal, inguinal and mesenteric adenopathy. Differential possibilities include lymphoma or metastatic disease. Percutaneous biopsy is recommended. 2. Moderate scrotal wall thickening. 3. Other findings as above. Jasmine December D'Heureux, MD 03/11/2022 8:41 AM    Chest AP Portable    Result Date: 03/09/2022  HISTORY: Chest Pain COMPARISON: Chest radiograph 02/17/2021 FINDINGS: LINES AND TUBES: Stable left pacemaker with stable leads overlying the heart CARDIAC SILHOUETTE: Normal size. LUNGS/PLEURA: No consolidation or edema. No pleural effusion or pneumothorax.     No acute cardiopulmonary process. Legrand Pitts, MD 03/09/2022 1:03 PM    US Scrotum and Testicles w Doppler Ltd    Result Date: 03/06/2022  HISTORY: Scrotal edema COMPARISON: None. FINDINGS: Measurements: Right testis: 4 x 2.6 x 3.1 cm Left testis: 3.5 x 2.5 x 3.4 cm The testes have normal size and echotexture and are normal in location. No masses. Color and spectral Doppler interrogation demonstrate normal and symmetric arterial and venous flow within each testis. Small right epididymal head cyst measuring 5 mm. Moderate sized complex left hydrocele with septations and debris. No varicocele seen. There is edematous thickening of the scrotal wall without a fluid collection detected.      1. Moderate sized complex left hydrocele with septations. 2. No intratesticular masses or hyperemia. 3. Scrotal wall edema without fluid collection. Linna Caprice, MD 03/06/2022 11:10 AM    Pathology:   Specimens (From admission, onward)      None          Pending Lab Results:   Labs/Images to be followed at your PCP office:   Unresulted Labs       Procedure . . . Date/Time    CLL  Prognostic Panel, Comprehensive [[id number]] Collected: 03/19/22 0619     Updated: 03/19/22 0636    Narrative:      Send to Lab Immediately, 48 hr stability  Send to Lab Immediately, 48 hr stability  Send to Lab Immediately, 48 hr stability  Send to Lab Immediately, 48 hr stability          Hospitalist:     Signed by: Julien Girt, MD  03/20/2022 3:43 PM  Time spent for discharge including face-to-face and non-face-to-face: > 30 minutes      *  This note was generated by the Epic EMR system/ Dragon speech recognition and may contain inherent errors or omissions not intended by the user. Grammatical errors, random word insertions, deletions, pronoun errors and incomplete sentences are occasional consequences of this technology due to software limitations. Not all errors are caught or corrected. If there are questions or concerns about the content of this note or information contained within the body of this dictation they should be addressed directly with the author for clarification

## 2022-03-20 NOTE — OT Plan of Care Note (Signed)
Occupational Therapy Cancellation Note    Patient: [name] [name]  ZOX:[id number]    Unit: J8119/[id number]    Patient not seen for occupational therapy secondary to pt declined services today. OT made two attempts to tx pt today, at 1042 pt OOB in chair when OT arrived and requested that OT return back in as pt was having "constipation issues". OT returned at 1138 and pt was in bed and declined to participate at this time due to INC fatigue, pt reported not sleeping well last night due to constipation issues. RN notified of pt's decline of OT services today. OT will continue to follow up. Theophilus Walz, OTR/L

## 2022-03-20 NOTE — Plan of Care (Signed)
Problem: Day of Admission - Heart Failure  Goal: Heart Failure Admission  Outcome: Progressing     Problem: Everyday - Heart Failure  Goal: Stable Vital Signs and Fluid Balance  Outcome: Progressing  Flowsheets (Taken 03/20/2022 0133 by Venda Rodes, RN)  Stable Vital Signs and Fluid Balance:   Daily Standing Weights in the morning using the same scale, after using the bathroom and before breadfast.  If unable to stand, zero the bed and use the bed scale   Monitor, assess vital signs and telemetry per policy   Fluid Restriction   Assess for swelling/edema   Wean oxygen as needed if appropriate   Monitor labs and report abnormalities to physician   Strict Intake/Output  Goal: Mobility/Activity is Maintained at Optimal Level for Patient  Outcome: Progressing  Flowsheets (Taken 03/20/2022 0133 by Venda Rodes, RN)  Mobility/Activity is Maintained at Optimal Level for Patient:   Reposition patient every 2 hours and as needed unless able to reposition self   Increase mobility as tolerated/progressive mobility protocol   Maintain SCD's as Ordered   Assess for changes in respiratory status, level of consciousness and/or development of fatigue   Consult with Physical Therapy and/or Occupational Therapy   Perform active/passive ROM  Goal: Nutritional Intake is Adequate  Outcome: Progressing  Flowsheets (Taken 03/20/2022 0133 by Venda Rodes, RN)  Nutritional Intake is Adequate:   Cardiac diet-2 gm Sodium   Fluid Restricction if needed   Patient and family teaching on low sodium diet   Encourage/perform oral hygiene as appropriate   Assess appetite,anorexia and amount of meal/food tolerated   Consult/Collaborate with Nutritionist  Goal: Teaching-Using CHF Warning Zones and Educational Videos  Outcome: Progressing  Flowsheets (Taken 03/20/2022 0133 by Venda Rodes, RN)  Teaching-Using CHF Warning Zones and Educational Videos:   Signs & Symptoms of CHF   Sodium Restriction   Fluid Restriction if appropriate   Daily Standing  Weights & record     Problem: Day of Discharge - Heart Failure  Goal: Discharge Education  Outcome: Progressing  Flowsheets (Taken 03/09/2022 1834 by Clifton Custard, RN)  Day of Discharge:   After Visit Summary (Discharge Instuctions) with medications   CHF Warning Zones and when to call for help   Daily Standing Weights   Fluid Restriction   Low Sodium Diet   Follow-up appointments     Problem: Safety  Goal: Patient will be free from injury during hospitalization  Outcome: Progressing  Flowsheets (Taken 03/17/2022 2347 by Adaline Sill, RN)  Patient will be free from injury during hospitalization:   Assess patient's risk for falls and implement fall prevention plan of care per policy   Provide and maintain safe environment   Assess for patients risk for elopement and implement Elopement Risk Plan per policy   Use appropriate transfer methods  Goal: Patient will be free from infection during hospitalization  Outcome: Progressing  Flowsheets (Taken 03/18/2022 2327 by Adaline Sill, RN)  Free from Infection during hospitalization:   Assess and monitor for signs and symptoms of infection   Monitor lab/diagnostic results   Encourage patient and family to use good hand hygiene technique   Monitor all insertion sites (i.e. indwelling lines, tubes, urinary catheters, and drains)     Problem: Pain  Goal: Pain at adequate level as identified by patient  Outcome: Progressing  Flowsheets (Taken 03/17/2022 0915 by Cherly Hensen, RN)  Pain at adequate level as identified by patient:   Identify patient comfort function goal   Assess for  risk of opioid induced respiratory depression, including snoring/sleep apnea. Alert healthcare team of risk factors identified.   Assess pain on admission, during daily assessment and/or before any "as needed" intervention(s)   Reassess pain within 30-60 minutes of any procedure/intervention, per Pain Assessment, Intervention, Reassessment (AIR) Cycle   Evaluate if patient comfort function goal is  met   Offer non-pharmacological pain management interventions   Consult/collaborate with Physical Therapy, Occupational Therapy, and/or Speech Therapy   Include patient/patient care companion in decisions related to pain management as needed   Consult/collaborate with Pain Service   Evaluate patient's satisfaction with pain management progress     Problem: Side Effects from Pain Analgesia  Goal: Patient will experience minimal side effects of analgesic therapy  Outcome: Progressing     Problem: Discharge Barriers  Goal: Patient will be discharged home or other facility with appropriate resources  Outcome: Progressing  Flowsheets (Taken 03/11/2022 1034 by Theodoro Doing, RN)  Discharge to home or other facility with appropriate resources:   Provide appropriate patient education   Provide information on available health resources   Initiate discharge planning     Problem: Psychosocial and Spiritual Needs  Goal: Demonstrates ability to cope with hospitalization/illness  Outcome: Progressing  Flowsheets (Taken 03/11/2022 1034 by Theodoro Doing, RN)  Demonstrates ability to cope with hospitalizations/illness:   Encourage verbalization of feelings/concerns/expectations   Assist patient to identify own strengths and abilities   Provide quiet environment   Encourage patient to set small goals for self   Reinforce positive adaptation of new coping behaviors   Encourage participation in diversional activity   Include patient/ patient care companion in decisions     Problem: Moderate/High Fall Risk Score >5  Goal: Patient will remain free of falls  Outcome: Progressing  Flowsheets  Taken 03/20/2022 0800  High (Greater than 13):   HIGH-Initiate use of floor mats as appropriate   HIGH-Pharmacy to initiate evaluation and intervention per protocol   HIGH-Apply yellow "Fall Risk" arm band   HIGH-Utilize chair pad alarm for patient while in the chair   HIGH-Bed alarm on at all times while patient in bed   HIGH-Visual cue  at entrance to patient's room  Taken 03/19/2022 1538  VH High Risk (Greater than 13):   ALL REQUIRED LOW INTERVENTIONS   ALL REQUIRED MODERATE INTERVENTIONS   RED "HIGH FALL RISK" SIGNAGE   BED ALARM WILL BE ACTIVATED WHEN THE PATEINT IS IN BED WITH SIGNAGE "RESET BED ALARM"   A CHAIR PAD ALARM WILL BE USED WHEN PATIENT IS UP SITTING IN A CHAIR   PATIENT IS TO BE SUPERVISED FOR ALL TOILETING ACTIVITIES     Problem: Fluid and Electrolyte Imbalance/ Endocrine  Goal: Fluid and electrolyte balance are achieved/maintained  Outcome: Progressing  Flowsheets (Taken 03/17/2022 0915 by Cherly Hensen, RN)  Fluid and electrolyte balance are achieved/maintained:   Monitor intake and output every shift   Assess for confusion/personality changes   Observe for seizure activity and initiate seizure precautions if indicated   Monitor for muscle weakness   Monitor daily weight   Monitor/assess lab values and report abnormal values   Assess and reassess fluid and electrolyte status   Provide adequate hydration   Observe for cardiac arrhythmias  Goal: Adequate hydration  Outcome: Progressing     Problem: Diabetes: Glucose Imbalance  Goal: Blood glucose stable at established goal  Outcome: Progressing  Flowsheets (Taken 03/17/2022 2347 by Adaline Sill, RN)  Blood glucose stable at established goal:  Monitor lab values   Monitor intake and output.  Notify LIP if urine output is < 30 mL/hour.   Assess for hypoglycemia /hyperglycemia   Monitor/assess vital signs   Ensure appropriate diet and assess tolerance     Problem: Compromised Tissue integrity  Goal: Damaged tissue is healing and protected  Outcome: Progressing  Flowsheets (Taken 03/17/2022 2347 by Adaline Sill, RN)  Damaged tissue is healing and protected:   Monitor/assess Braden scale every shift   Provide wound care per wound care algorithm   Increase activity as tolerated/progressive mobility   Keep intact skin clean and dry  Goal: Nutritional status is improving  Outcome:  Progressing  Flowsheets (Taken 03/17/2022 0915 by Cherly Hensen, RN)  Nutritional status is improving:   Assist patient with eating   Collaborate with Clinical Nutritionist   Allow adequate time for meals   Include patient/patient care companion in decisions related to nutrition   Encourage patient to take dietary supplement(s) as ordered     Problem: Venous Thrombotic Event  Goal: Knowledge deficit/VTE Prevention  Outcome: Progressing  Flowsheets (Taken 03/20/2022 1516)  Knowledge deficit/ VTE Prevention:   Reinforce signs/symptoms to report to physician   Review medications: to include dose, frequency, drug interactions and side effects   Discuss medications to avoid without physician approval   Reinforce personal safety measures while on anticoagulant therapy   Reinforce that patient must tell their doctor about all medications they are taking including OTC, supplements and herbal preparations   Discuss importance of follow up lab studies as directed by physician if discharged on anticoagulants  Goal: Free from evidence of bleeding  Outcome: Progressing  Flowsheets (Taken 03/17/2022 2347 by Adaline Sill, RN)  Free from evidence of bleeding:   Administer VTE prophylaxis as prescribed   Administer Anticoagulants as prescribed   Initiate anticoagulation precautions   Assess patient's risk for falls and implement fall prevention plan of care per policy   Monitor for signs and symptoms of bleeding   Monitor for adverse reactions to anticoagulant   Monitor results of ordered laboratory studies  Goal: Patient / Caregiver demonstrate/acknowledge a basic survival level of understanding on disease process, treatment plan, medications and discharge plan  Outcome: Progressing

## 2022-03-20 NOTE — Discharge Instr - AVS First Page (Addendum)
Reason for your Hospital Admission:  CHF exacerbation  CLL  Hydrocele      Instructions for after your discharge:  Stop taking carvedilol, Entresto, spironolactone and Zaroxolyn  You will be started on low-dose metoprolol and Diovan  You will be started on Eliquis for blood clot in your right lower extremity-take Eliquis 2 tabs twice daily for 1 week followed by 1 tab twice daily  Recommend to follow-up with advanced heart specialist in 1 week  Recommend to follow-up with Dr. Laurine Blazer as outpatient for initiation of treatment of CLL  Follow-up with urology for hydrocele

## 2022-03-20 NOTE — Progress Notes (Signed)
IV removed with catheter intact, hemostasis achieved.  Pt d/c instructions read and understood by pt, allowed adequate time for questions. CHF package is given with educations.  Pt signed in agreement. Wife is with pt. Pt taken down in wheelchair with personal belongings to family vehicle. Destination is home.

## 2022-03-21 ENCOUNTER — Other Ambulatory Visit (INDEPENDENT_AMBULATORY_CARE_PROVIDER_SITE_OTHER): Payer: Self-pay | Admitting: Cardiovascular Disease

## 2022-03-21 DIAGNOSIS — I428 Other cardiomyopathies: Secondary | ICD-10-CM

## 2022-03-21 DIAGNOSIS — I5022 Chronic systolic (congestive) heart failure: Secondary | ICD-10-CM

## 2022-03-23 ENCOUNTER — Telehealth (INDEPENDENT_AMBULATORY_CARE_PROVIDER_SITE_OTHER): Payer: Self-pay | Admitting: Family Medicine

## 2022-03-23 NOTE — Telephone Encounter (Signed)
Called Pt and informed him that he will need to schedule a visit to discuss home health services.   Dorine K.N. stated through secure chat that "Pt was recently at [facility] due to CHF. He is requesting home health services and follow up visit. Please reach out to him". Dr. Altamease Oiler responded that "Good morning I am.  Please can you see if we can get patient in for a quick video visit so I can discuss with him which services he needs"  Pt stated that he will callback with his wife to schedule an appointment

## 2022-03-23 NOTE — Progress Notes (Signed)
AMBULATORY CARE MANAGEMENT CIPHER AUTOMATED CALL     Name: [name] [name]    ### Patient Details  Date of Birth: 1946/03/22  MRN: [id number]    ### Encounter Details  Arrival Date: 03/09/2022 01:42 PM EDT  Discharge Date: 03/20/2022 04:23 PM EDT  Encounter ID: [id number]    ### Related interaction  [place] [facility]CM Post Discharge Outreach (Post Discharge TCM Outreach 1) ([email or web address])    ### Questions     Question 1   Home Health Follow Up   Have you heard from the home health agency?  Press 1 for Yes,  Press 2 for No.   Have not heard from Home Health (Issue Panel: Post Discharge - TCM)    ### Required Interventions and Feedback     Call Status          General Status          General Status - Issues:     Other (edited by DN on 03/23/2022 10:00 AM EDT)     Home Health         Home Health - Issues:     No HH Order. (edited by DN on 03/23/2022 10:01 AM EDT)     Home Health - Actions Taken         Comments:     contacted PCP and left a voicemail (edited by DN on 03/23/2022 10:01 AM EDT)     Additional Questions         Comments:     CM contacted pt to provide update regarding call with PCP. Pt encouraged to scheduled follow up appointment with PCP and cardiologist. He was advised to callback if PCP office does not reach out to him by tomorrow. Overall, pt reported of "doing well" and lacking CHF symptoms. (edited by DN on 03/23/2022 10:04 AM EDT).    Arma Heading, ACM-SW  Case Manager  [facility]  [facility]  [address]  [address]  [place], [place] [address]  T [phone]

## 2022-03-26 LAB — CLL PROGNOSTIC PANEL COMPREHENSIVE
Beta-2-Microglobulin: 8.85 mg/L — ABNORMAL HIGH (ref <=2.51)
Mutation Rate: 0 %
Percent CD19/CD5: 35 % — ABNORMAL HIGH (ref <30)
Viability: 72 %
ZAP70+/CD19+: 1 % (ref <10)

## 2022-03-31 ENCOUNTER — Inpatient Hospital Stay (INDEPENDENT_AMBULATORY_CARE_PROVIDER_SITE_OTHER): Payer: Commercial Managed Care - POS | Admitting: Nurse Practitioner

## 2022-04-02 ENCOUNTER — Ambulatory Visit (INDEPENDENT_AMBULATORY_CARE_PROVIDER_SITE_OTHER): Payer: Commercial Managed Care - POS | Admitting: Nurse Practitioner

## 2022-04-02 ENCOUNTER — Encounter (INDEPENDENT_AMBULATORY_CARE_PROVIDER_SITE_OTHER): Payer: Self-pay | Admitting: Nurse Practitioner

## 2022-04-02 DIAGNOSIS — I5022 Chronic systolic (congestive) heart failure: Secondary | ICD-10-CM

## 2022-04-02 DIAGNOSIS — I428 Other cardiomyopathies: Secondary | ICD-10-CM

## 2022-04-02 MED ORDER — VALSARTAN 40 MG PO TABS
20.0000 mg | ORAL_TABLET | Freq: Every day | ORAL | 3 refills | Status: DC
Start: 2022-04-02 — End: 2022-09-14

## 2022-04-02 NOTE — Progress Notes (Signed)
[place] [facility] ADVANCED HEART FAILURE OFFICE PROGRESS NOTE    [facility] OFFICE -CARDIOLOGY  [address]  [place] [address]  Dept: [phone]  Dept Fax: [phone]       Patient Name: [name] [name]    Date of Visit:  April 02, 2022  Date of Birth: 01/23/1946  AGE: 76 y.o.  Medical Record #: [id number]      CHIEF COMPLAINT:    Chief Complaint   Patient presents with    Hospital Follow-up       HISTORY OF PRESENT ILLNESS:    I had the pleasure of seeing [name] today in follow up in the Advanced Heart Failure Clinic at [facility]. He is a pleasant 76 y.o. 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  For HF GDMT, he was previously on carvedilol 25 mg twice daily, Entresto 97/23 mg twice daily, Jardiance 10 mg daily, and spironolactone 12.5 mg daily. He has a Medtronic ICD in place.     He was initially seen by urology couple weeks prior for severe scrotal edema.  He also mentioned that he had lower extremity edema as well.  Eventually was admitted to [facility] 03/09/2022 for worsening swelling and shortness of breath. Chest x-ray showed no acute cardiopulmonary process.  He had an ultrasound of the scrotum and testicles, showed moderate size, left high steel scrotal wall edema.  After IV diuresis, he noticed improvement in his breathing and LE edema. 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 Per Dr. Laurine Blazer from [facility] there was discussion regarding treatment with BTK inhibitors or venetoclax. Seen by urology, who did not recommend surgery at this time.  He was also found to have right lower extremity DVT now on Eliquis for anticoagulation.     During his hospital course, spironolactone was discontinued for hyperkalemia. He also briefly required pressors which felt to be related to hypovolemia. He was restarted on low dose Valsartan prior  to discharge.     At follow-up today, reports stable symptoms since discharge.  Average blood pressures are now in the systolic 110s to 295M.  Denies any worsening shortness of breath, orthopnea, PND, or edema.  On his home scale, he is weighing around 190 pounds.  He saw Dr. Laurine Blazer earlier this week and was started on a new medication. He has not been able to pick it up from pharmacy yet. Still awaiting approval. Otherwise, no cardiac complaints today.     PAST MEDICAL HISTORY: He has a past medical history of Acute CHF, Acute systolic (congestive) heart failure (08/2017), Anxiety, CAD (coronary artery disease), Cardiomyopathy, CHF (congestive heart failure) (08/12/2014, 2013), Chronic obstructive pulmonary disease, Coronary artery disease (2011), Depression, echocardiogram (02/2016, 11/2016, 09/2017, 12/20/2017), GERD (gastroesophageal reflux disease), Heart attack (2011), Hyperlipemia, Hypertension, ICD (implantable 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, Ischemic cardiomyopathy, Nonischemic cardiomyopathy, NSTEMI (non-ST elevated myocardial infarction), 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 He has a past surgical history that includes duodenal ulcer (1973); orthopedic surgery (AGE 73); EGD (N/A, 08/15/2014); Circumcision (AGE 53); Fracture surgery (AGE 10); Hernia repair (AGE 53); Colonoscopy (2009); Tonsillectomy and adenoidectomy (1954); Cardiac defibrillator placement (12/2012); Cardiac pacemaker placement (2014); EGD (1975); EGD, COLONOSCOPY (N/A, 04/04/2018); ECHOCARDIOGRAM, TRANSTHORACIC (02/2016 11/2016 09/2017 12/2017); MPI nuclear study (06/2016); Correction hammer toe; BIV (11/10/2016); ECHOCARDIOGRAM, TRANSTHORACIC (02/2016,11/2016,12/20/2017);  Cardiac catheterization (03/2010); and Cardiac catheterization (12/2012).    ALLERGIES: No Known Allergies    MEDICATIONS:    Current Outpatient Medications:     apixaban (ELIQUIS) 5 MG, Take 2 tablets (10 mg) by mouth every 12 (twelve) hours For 1 week followed by 1 tablet twice daily, Disp: 90 tablet, Rfl: 2    aspirin EC 81 MG EC tablet, Take 1 tablet (81 mg) by mouth daily, Disp: , Rfl:     atorvastatin (LIPITOR) 40 MG tablet, Take 1 tablet (40 mg total) by mouth nightly, Disp: 90 tablet, Rfl: 0    empagliflozin (JARDIANCE) 10 MG tablet, Take 1 tablet (10 mg total) by mouth daily, Disp: 90 tablet, Rfl: 3    fluticasone-salmeterol (ADVAIR DISKUS) 100-50 MCG/ACT Aerosol Pwdr, Breath Activated, Inhale 1 puff into the lungs 2 (two) times daily, Disp: 3 each, Rfl: 3    glucose blood (ONE TOUCH ULTRA TEST) test strip, 1 each by Other route 2 (two) times daily Use as instructed, Disp: 300 each, Rfl: 1    insulin glargine (LANTUS SOLOSTAR) 100 UNIT/ML injection pen, Patient takes 50 units at night, Disp: 45 mL, Rfl: 3    Insulin Pen Needle (BD Pen Needle Nano U/F) 32G X 4 MM Misc, Use as directed bid, Disp: 200 each, Rfl: 3    metoprolol succinate XL (TOPROL-XL) 25 MG 24 hr tablet, Take 0.5 tablets (12.5 mg) by mouth daily, Disp: 30 tablet, Rfl: 2    niacin (NIASPAN) 500 MG CR tablet, TAKE 1 TABLET BY MOUTH TWICE DAILY, Disp: 180 tablet, Rfl: 3    PARoxetine (Paxil) 20 MG tablet, Take 1 tablet (20 mg total) by mouth every morning, Disp: 30 tablet, Rfl: 11    Semglee, yfgn, 100 UNIT/ML Solution Pen-injector, ADMINISTER 50 UNITS UNDER THE SKIN AT BEDTIME, Disp: 45 mL, Rfl: 3    SITagliptin-metFORMIN (Janumet) 50-500 MG tablet, Take 1 tablet by mouth 2 (two) times daily with meals, Disp: 60 tablet, Rfl: 5    torsemide (DEMADEX) 20 MG tablet, TAKE 3 TABLETS BY MOUTH EVERY MORNING, Disp: 270 tablet, Rfl: 3    valsartan (DIOVAN) 40 MG tablet, Take 0.5 tablets (20 mg) by mouth daily, Disp: 90 tablet, Rfl: 3     FAMILY HISTORY: family history includes Breast cancer in his mother; Coronary artery disease in his mother; Diabetes in his brother and  mother; Heart attack in his father; Heart attack (age of onset: 90) in his mother; Hypertension in his father and mother.    SOCIAL HISTORY: He reports that he has never smoked. He has never used smokeless tobacco. He reports that he does not drink alcohol and does not use drugs.      PHYSICAL EXAMINATION    Visit Vitals  BP 108/80 (BP Site: Left arm, Patient Position: Sitting, Cuff Size: Medium)   Pulse 82   Ht 1.829 m (6')   Wt 85.3 kg (188 lb)   SpO2 98%   BMI 25.50 kg/m       Last 3 Weights:       03/19/2022     6:29 AM 03/20/2022     6:38 AM 04/02/2022    12:49 PM   Weight Monitoring   Height   182.9 cm   Weight 93.577 kg 92.398 kg 85.276 kg   Weight Method Standing Scale Standing Scale    BMI (calculated)   25.6 kg/m2         General Appearance:  Cooperative and in no acute distress.  Skin: Warm and dry to touch  Head: Normocephalic  Eyes: Conjunctivae and lids unremarkable.    Neck: No JVD  Chest: Clear to auscultation bilaterally with good respiratory effort and no wheezes, rales, or rhonchi   Cardiovascular: Regular rhythm, S1 normal, S2 normal, No S3 or S4  Abdomen: Soft, nontender with normoactive bowel sounds.   Extremities: Warm/dry without edema, +2 b/l radial pulses  Neuro: Alert and oriented x3. No gross motor or sensory deficits noted, affect appropriate.      IMPRESSION:   [name] [name] is a 76 y.o. male with the following problems:    Recent admission for acute on chronic systolic CHF and scrotal edema  EF 25 to 30% by echo August 2022.  Moderate tricuspid and mitral regurgitation present.  PA pressures were 54 mmHg.   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   Continue Torsemide 40mg   in AM and 20mg  in afternoon   Request recent office visit note from [place]. He was recently seen by Dr. Laurine Blazer for follow up and was started on treatment for his CLL. Suspect the scrotal edema will improve/resolve once enlarged lymph nodes are treated  Repeat BMP prior to next visit   Consider uptitration of GDMT at next visit- either BB or ARB   Keep scheduled visit with Dr. Webb Silversmith, or sooner if needed                                                  Orders Placed This Encounter   Procedures    Basic Metabolic Panel       Orders Placed This Encounter   Medications    valsartan (DIOVAN) 40 MG tablet     Sig: Take 0.5 tablets (20 mg) by mouth daily     Dispense:  90 tablet     Refill:  3         SIGNED:    Conception Oms, NP          This note was generated by the Dragon speech recognition and may contain errors or omissions not intended by the user. Grammatical errors, random word insertions, deletions, pronoun errors, and incomplete sentences are occasional consequences of this technology due to software limitations. Not all errors are caught or corrected. If there are questions or concerns about the content of this note or information contained within the body of this dictation, they should be addressed directly with the author for clarification.

## 2022-04-06 ENCOUNTER — Encounter (INDEPENDENT_AMBULATORY_CARE_PROVIDER_SITE_OTHER): Payer: Self-pay | Admitting: Family Medicine

## 2022-04-06 ENCOUNTER — Inpatient Hospital Stay (INDEPENDENT_AMBULATORY_CARE_PROVIDER_SITE_OTHER): Payer: Commercial Managed Care - POS

## 2022-04-07 ENCOUNTER — Other Ambulatory Visit (FREE_STANDING_LABORATORY_FACILITY): Payer: Commercial Managed Care - POS

## 2022-04-07 DIAGNOSIS — I428 Other cardiomyopathies: Secondary | ICD-10-CM

## 2022-04-07 LAB — HEMOLYSIS INDEX: Hemolysis Index: 9 Index (ref 0–24)

## 2022-04-07 LAB — BASIC METABOLIC PANEL
Anion Gap: 11 (ref 5.0–15.0)
BUN: 29 mg/dL — ABNORMAL HIGH (ref 9.0–28.0)
CO2: 26 mEq/L (ref 17–29)
Calcium: 9.4 mg/dL (ref 7.9–10.2)
Chloride: 106 mEq/L (ref 99–111)
Creatinine: 1.6 mg/dL — ABNORMAL HIGH (ref 0.5–1.5)
Glucose: 86 mg/dL (ref 70–100)
Potassium: 5.8 mEq/L — ABNORMAL HIGH (ref 3.5–5.3)
Sodium: 143 mEq/L (ref 135–145)
eGFR: 44.3 mL/min/{1.73_m2} — AB (ref >=60)

## 2022-04-08 ENCOUNTER — Ambulatory Visit (INDEPENDENT_AMBULATORY_CARE_PROVIDER_SITE_OTHER): Payer: Commercial Managed Care - POS | Admitting: Cardiovascular Disease

## 2022-04-08 ENCOUNTER — Encounter (INDEPENDENT_AMBULATORY_CARE_PROVIDER_SITE_OTHER): Payer: Self-pay | Admitting: Cardiovascular Disease

## 2022-04-08 ENCOUNTER — Telehealth (INDEPENDENT_AMBULATORY_CARE_PROVIDER_SITE_OTHER): Payer: Self-pay

## 2022-04-08 DIAGNOSIS — I5022 Chronic systolic (congestive) heart failure: Secondary | ICD-10-CM

## 2022-04-08 DIAGNOSIS — I428 Other cardiomyopathies: Secondary | ICD-10-CM

## 2022-04-08 NOTE — Progress Notes (Signed)
 [facility] OFFICE PROGRESS NOTE    HRT [facility] OFFICE -CARDIOLOGY  [address]  [place] [address]  Dept: [phone]  Dept Fax: [phone]       Patient Name: [name] [name]    Date of Visit:  April 08, 2022  Date of Birth: [birth year]  AGE: 76 y.o.  Medical Record #: [id number]      CHIEF COMPLAINT:    No chief complaint on file.      HISTORY OF PRESENT ILLNESS:    I had the pleasure of seeing [name] [name] today in follow up in the Advanced Heart Failure Clinic at [facility]. He is a pleasant 76 y.o. 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  For HF GDMT, he was previously on carvedilol 25 mg twice daily, Entresto 97/23 mg twice daily, Jardiance 10 mg daily, and spironolactone 12.5 mg daily. He has a Medtronic ICD in place.     He was initially seen by urology couple weeks prior for severe scrotal edema.  He also mentioned that he had lower extremity edema as well.  Eventually was admitted to [facility] 03/09/2022 for worsening swelling and shortness of breath. Chest x-ray showed no acute cardiopulmonary process.  He had an ultrasound of the scrotum and testicles, showed moderate size, left high steel scrotal wall edema.  After IV diuresis, he noticed improvement in his breathing and LE edema. 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 Per Dr. Laurine Blazer from [facility] there was discussion regarding treatment with BTK inhibitors or venetoclax. Seen by urology, who did not recommend surgery at this time.  He was also found to have right lower extremity DVT now on Eliquis for anticoagulation.     During his hospital course, spironolactone was discontinued for hyperkalemia. He also briefly required pressors which felt to be related to hypovolemia. He was restarted on low dose Valsartan prior to discharge.     At follow-up  today, reports stable symptoms since discharge.  Average blood pressures are now in the systolic 110s to 295M.  Denies any worsening shortness of breath, orthopnea, PND, or edema.  On his home scale, he is weighing around 190 pounds.  He saw Dr. Laurine Blazer and was started on a new medication (name unknown).  He has been taking this medication for 1 week.    PAST MEDICAL HISTORY: He has a past medical history of Acute CHF, Acute systolic (congestive) heart failure (08/2017), Anxiety, CAD (coronary artery disease), Cardiomyopathy, CHF (congestive heart failure) (08/12/2014, 2013), Chronic obstructive pulmonary disease, Coronary artery disease (2011), Depression, echocardiogram (02/2016, 11/2016, 09/2017, 12/20/2017), GERD (gastroesophageal reflux disease), Heart attack (2011), Hyperlipemia, Hypertension, ICD (implantable 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, Ischemic cardiomyopathy, Nonischemic cardiomyopathy, NSTEMI (non-ST elevated myocardial infarction), 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 He has a past surgical history that includes duodenal ulcer (1973); orthopedic surgery (AGE 5); EGD (N/A, 08/15/2014); Circumcision (AGE 5); Fracture surgery (AGE 11); Hernia repair (AGE 5); Colonoscopy (2009); Tonsillectomy and adenoidectomy (1954); Cardiac defibrillator placement (12/2012); Cardiac pacemaker placement (2014); EGD (1975); EGD, COLONOSCOPY (N/A, 04/04/2018); ECHOCARDIOGRAM, TRANSTHORACIC (02/2016 11/2016 09/2017 12/2017); MPI nuclear study (06/2016); Correction hammer toe; BIV (11/10/2016); ECHOCARDIOGRAM, TRANSTHORACIC (02/2016,11/2016,12/20/2017); Cardiac catheterization (03/2010); and Cardiac catheterization (12/2012).    ALLERGIES: No Known Allergies    MEDICATIONS:  Current Outpatient Medications:     apixaban (ELIQUIS) 5 MG, Take 2 tablets (10 mg) by mouth every 12  (twelve) hours For 1 week followed by 1 tablet twice daily, Disp: 90 tablet, Rfl: 2    aspirin EC 81 MG EC tablet, Take 1 tablet (81 mg) by mouth daily, Disp: , Rfl:     atorvastatin (LIPITOR) 40 MG tablet, Take 1 tablet (40 mg total) by mouth nightly, Disp: 90 tablet, Rfl: 0    empagliflozin (JARDIANCE) 10 MG tablet, Take 1 tablet (10 mg total) by mouth daily, Disp: 90 tablet, Rfl: 3    fluticasone-salmeterol (ADVAIR DISKUS) 100-50 MCG/ACT Aerosol Pwdr, Breath Activated, Inhale 1 puff into the lungs 2 (two) times daily, Disp: 3 each, Rfl: 3    glucose blood (ONE TOUCH ULTRA TEST) test strip, 1 each by Other route 2 (two) times daily Use as instructed, Disp: 300 each, Rfl: 1    insulin glargine (LANTUS SOLOSTAR) 100 UNIT/ML injection pen, Patient takes 50 units at night, Disp: 45 mL, Rfl: 3    Insulin Pen Needle (BD Pen Needle Nano U/F) 32G X 4 MM Misc, Use as directed bid, Disp: 200 each, Rfl: 3    metoprolol succinate XL (TOPROL-XL) 25 MG 24 hr tablet, Take 0.5 tablets (12.5 mg) by mouth daily, Disp: 30 tablet, Rfl: 2    niacin (NIASPAN) 500 MG CR tablet, TAKE 1 TABLET BY MOUTH TWICE DAILY, Disp: 180 tablet, Rfl: 3    PARoxetine (Paxil) 20 MG tablet, Take 1 tablet (20 mg total) by mouth every morning, Disp: 30 tablet, Rfl: 11    Semglee, yfgn, 100 UNIT/ML Solution Pen-injector, ADMINISTER 50 UNITS UNDER THE SKIN AT BEDTIME, Disp: 45 mL, Rfl: 3    SITagliptin-metFORMIN (Janumet) 50-500 MG tablet, Take 1 tablet by mouth 2 (two) times daily with meals, Disp: 60 tablet, Rfl: 5    torsemide (DEMADEX) 20 MG tablet, TAKE 3 TABLETS BY MOUTH EVERY MORNING, Disp: 270 tablet, Rfl: 3    valsartan (DIOVAN) 40 MG tablet, Take 0.5 tablets (20 mg) by mouth daily, Disp: 90 tablet, Rfl: 3     FAMILY HISTORY: family history includes Breast cancer in his mother; Coronary artery disease in his mother; Diabetes in his brother and mother; Heart attack in his father; Heart attack (age of onset: 72) in his mother; Hypertension in his  father and mother.    SOCIAL HISTORY: He reports that he has never smoked. He has never used smokeless tobacco. He reports that he does not drink alcohol and does not use drugs.      PHYSICAL EXAMINATION    There were no vitals taken for this visit.      Last 3 Weights:      Row Labels 03/19/2022     6:29 AM 03/20/2022     6:38 AM 04/02/2022    12:49 PM   Weight Monitoring   Section Header. No data exists in this row.      Height     182.9 cm   Weight   93.577 kg 92.398 kg 85.276 kg   Weight Method   Standing Scale Standing Scale    BMI (calculated)     25.6 kg/m2         General Appearance:  Cooperative and in no acute distress.    Skin: Warm and dry to touch  Head: Normocephalic  Eyes: Conjunctivae and lids unremarkable.    Neck: No JVD  Chest: Clear to auscultation bilaterally with good respiratory effort  and no wheezes, rales, or rhonchi   Cardiovascular: Regular rhythm, S1 normal, S2 normal, No S3 or S4  Abdomen: Soft, nontender with normoactive bowel sounds.   Extremities: Warm/dry without edema, +2 b/l radial pulses  Neuro: Alert and oriented x3. No gross motor or sensory deficits noted, affect appropriate.      IMPRESSION:   [name] [name] is a 76 y.o. male with the following problems:    Recent admission for acute on chronic systolic CHF and scrotal edema  EF 25 to 30% by echo August 2022.  Moderate tricuspid and mitral regurgitation present.  PA pressures were 54 mmHg.   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  This may be due to the start of CLL medication and/or increasing valsartan to twice a day      RECOMMENDATIONS:    Valsartan 20mg  twice daily dosing is being reduced to 20mg  daily due to recent hyperkalemia  Continue Torsemide 40mg  in  AM and 20mg  in afternoon   Request recent office visit note from [place]. He was recently seen by Dr. Laurine Blazer for follow up and was started on treatment for his CLL. Suspect the scrotal edema will improve/resolve once enlarged lymph nodes are treated  Repeat BMP prior to next visit   Avoid high potassium foods.                                                   No orders of the defined types were placed in this encounter.      No orders of the defined types were placed in this encounter.        SIGNED:    Marianna Fuss, MD          This note was generated by the Dragon speech recognition and may contain errors or omissions not intended by the user. Grammatical errors, random word insertions, deletions, pronoun errors, and incomplete sentences are occasional consequences of this technology due to software limitations. Not all errors are caught or corrected. If there are questions or concerns about the content of this note or information contained within the body of this dictation, they should be addressed directly with the author for clarification.

## 2022-04-13 ENCOUNTER — Ambulatory Visit (INDEPENDENT_AMBULATORY_CARE_PROVIDER_SITE_OTHER): Payer: Commercial Managed Care - POS | Admitting: Family Medicine

## 2022-04-13 DIAGNOSIS — Z95810 Presence of automatic (implantable) cardiac defibrillator: Secondary | ICD-10-CM

## 2022-04-16 ENCOUNTER — Ambulatory Visit (INDEPENDENT_AMBULATORY_CARE_PROVIDER_SITE_OTHER): Payer: Commercial Managed Care - POS | Admitting: Family Medicine

## 2022-04-16 ENCOUNTER — Encounter (INDEPENDENT_AMBULATORY_CARE_PROVIDER_SITE_OTHER): Payer: Self-pay | Admitting: Family Medicine

## 2022-04-16 VITALS — BP 98/62 | HR 88 | Temp 98.9°F | Resp 20 | Wt 212.6 lb

## 2022-04-16 DIAGNOSIS — E875 Hyperkalemia: Secondary | ICD-10-CM

## 2022-04-16 DIAGNOSIS — Z794 Long term (current) use of insulin: Secondary | ICD-10-CM

## 2022-04-16 DIAGNOSIS — Z09 Encounter for follow-up examination after completed treatment for conditions other than malignant neoplasm: Secondary | ICD-10-CM

## 2022-04-16 DIAGNOSIS — E118 Type 2 diabetes mellitus with unspecified complications: Secondary | ICD-10-CM

## 2022-04-16 MED ORDER — FREESTYLE LIBRE 3 SENSOR MISC
1.0000 | 3 refills | Status: DC
Start: 2022-04-16 — End: 2022-09-09

## 2022-04-16 NOTE — Progress Notes (Signed)
Have you seen any specialists since your last visit with [place]?  Yes  ER  Cardiology   Oncology    The patient was informed that the following HM items are still outstanding:   colonoscopy, shingles vaccine, and Tdap, Advanced Directive On File

## 2022-04-16 NOTE — Progress Notes (Signed)
Subjective:      Date: 04/16/2022 2:26 PM   Patient ID: [name] [name] is a 76 y.o. male.    Chief Complaint:  Chief Complaint   Patient presents with    Hospital Follow-up     Edema in Rt leg and large mass in the groin area.     Rash     Pt started a new chemo medication for about 5 days and yesterday arms begin to have a rash. Very itchy , calquence 100mg     Diabetes     Pt would like a new glucose monitoring device        HPI:  HPI    DM : Patient is compliant with current medication.  Tries to exercise at least 3 times a week.  Watches type of diet and amount of carbohydrate intake.  Check lab work for diabetes regularly in our clinic.  Average 125 considering patient's last hemoglobin A1c was perfect I wonder if patient even needs to check his blood sugar.  But the patient was inquiring about the freestyle libre 3 and as long as insurance company is paying for it I think that would be a good idea just to measure the blood sugar levels.    Patient's oxygen saturation was very low when the nurse checked his oxygen but after the patient took a couple of deep breaths and I rechecked the oxygen level it was up to 99%.    Patient has a mild rash.  He tells me that he was started on a new medication from his oncologist because of the swelling in his scrotum.  I told patient that if the rash is getting worse or if he gets any symptoms like problems with breathing to immediately stop the medication and call the oncologist and see if they have an alternative.  But otherwise patient tells me that currently he is pretty much asymptomatic and feels well.    While patient was in the hospital his potassium was elevated.  I will recheck his potassium today.      F/u hospital   patient was in the hospital with extremely swollen scrotum.  He he was evaluated by cardiology for congestive heart failure.  But that seemed not to be the reason for the swelling.  Most likely the lymph drainage from this area was blocked.   Patient's oncologist changed his cancer medication and hopefully that will help.  Patient tells me that the swelling of the scrotum is already going down.    Problem List:  Patient Active Problem List   Diagnosis    Type 2 diabetes mellitus with complication, with long-term current use of insulin    Heart attack    CAD (coronary artery disease)    Non-ischemic cardiomyopathy    Acute exacerbation of CHF (congestive heart failure)    PUD (peptic ulcer disease)    Acute dyspnea    CHF (congestive heart failure)    Headaches due to old head injury    Essential hypertension    Syncope and collapse    AICD (automatic cardioverter/defibrillator) present    Cardiomyopathy    SOB (shortness of breath)    GERD (gastroesophageal reflux disease)    Hypertensive heart disease without heart failure    Heart failure, unspecified    Nonischemic cardiomyopathy    Pure hypercholesterolemia    Chronic systolic congestive heart failure    Encounter for implantable defibrillator reprogramming or check    Episode of recurrent major depressive disorder,  unspecified depression episode severity    Stage 3 chronic kidney disease, 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    Anxiety and depression    COPD (chronic obstructive pulmonary disease)    HLD (hyperlipidemia)    Sleep apnea, obstructive    Scrotal edema    Hydrocele, unspecified hydrocele type    Acute on chronic congestive heart failure, unspecified heart failure type       Current Medications:  Outpatient Medications Marked as Taking for the 04/16/22 encounter (Office Visit) with Zorita PangNoelle, Amiayah Giebel, MD   Medication Sig Dispense Refill    apixaban (ELIQUIS) 5 MG Take 2 tablets (10 mg) by mouth every 12 (twelve) hours For 1 week followed by 1 tablet twice daily 90 tablet 2    aspirin EC 81 MG EC tablet Take 1 tablet (81 mg) by mouth daily      atorvastatin (LIPITOR) 40 MG tablet Take 1 tablet (40 mg total) by mouth nightly 90 tablet 0    Calquence 100 MG Tab       empagliflozin (JARDIANCE)  10 MG tablet Take 1 tablet (10 mg total) by mouth daily 90 tablet 3    fluticasone-salmeterol (ADVAIR DISKUS) 100-50 MCG/ACT Aerosol Pwdr, Breath Activated Inhale 1 puff into the lungs 2 (two) times daily 3 each 3    glucose blood (ONE TOUCH ULTRA TEST) test strip 1 each by Other route 2 (two) times daily Use as instructed 300 each 1    insulin glargine (LANTUS SOLOSTAR) 100 UNIT/ML injection pen Patient takes 50 units at night 45 mL 3    Insulin Pen Needle (BD Pen Needle Nano U/F) 32G X 4 MM Misc Use as directed bid 200 each 3    metoprolol succinate XL (TOPROL-XL) 25 MG 24 hr tablet Take 0.5 tablets (12.5 mg) by mouth daily 30 tablet 2    niacin (NIASPAN) 500 MG CR tablet TAKE 1 TABLET BY MOUTH TWICE DAILY 180 tablet 3    PARoxetine (Paxil) 20 MG tablet Take 1 tablet (20 mg total) by mouth every morning 30 tablet 11    Semglee, yfgn, 100 UNIT/ML Solution Pen-injector ADMINISTER 50 UNITS UNDER THE SKIN AT BEDTIME 45 mL 3    SITagliptin-metFORMIN (Janumet) 50-500 MG tablet Take 1 tablet by mouth 2 (two) times daily with meals 60 tablet 5    torsemide (DEMADEX) 20 MG tablet TAKE 3 TABLETS BY MOUTH EVERY MORNING (Patient taking differently: 2 tablets in am 1 tablets in pm) 270 tablet 3    UNABLE TO FIND Med Name: Modal      valsartan (DIOVAN) 40 MG tablet Take 0.5 tablets (20 mg) by mouth daily 90 tablet 3       Allergies:  No Known Allergies    Past Medical History:  Past Medical History:   Diagnosis Date    Acute CHF     NOV  & DEC 2018, - DIALYSIS CATH PLACED Aug 24 2017 TO REMOVE FLUID     Acute systolic (congestive) heart failure 08/2017    Anxiety     CAD (coronary artery disease)     Cardiomyopathy     nonischemic    CHF (congestive heart failure) 08/12/2014, 2013    Chronic obstructive pulmonary disease     POSSIBLE PER PT HE USES  SYNBICORT BID ABD  BREO INHALER PRN    Coronary artery disease 2011    CORONARY STENT PLACEMENT FOLLOWED BY  HEART    Depression     echocardiogram 02/2016, 11/2016, 09/2017,  12/20/2017    GERD (gastroesophageal reflux disease)     Heart attack 2011    and 2014    Hyperlipemia     Hypertension     ICD (implantable 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     PLACED 2014  [facility]  LAST INTERROGATION April 01 2018 REPORT REQUESTED    Ischemic cardiomyopathy     EF 15% ON ECHO 09-20-2017 DR GARG IN EP[facility]    Nonischemic cardiomyopathy     NSTEMI (non-ST elevated myocardial infarction)     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    Pacemaker 2014    MEDTRONIC ICD/PACEMAKER COMBO LAST INTERROGATION  12-12 2018    Pneumonia 09/2016    Primary cardiomyopathy     Ischemic cardiomyopathy EF 15% ON ECHO 09-20-2017 DR GARG IN EPIC    Sepsis 08/2017    Syncope and collapse     PRIOR TO ICD/PACEMAKER PLACED 2014    Type 2 diabetes mellitus, controlled DX 1998    BS  AVG 100  A1C 7.4 Dec 27 2017    Wheeze     PT SEE HIS PMD April 01 2018 RE THIS-TOLD TO SEE PMD BY [facility] JUNE 12 WHEN THEY SAW HIM FOR A CL       Past Surgical History:  Past Surgical History:   Procedure Laterality Date    BIV  11/10/2016    ICD METRONIC  UPGRADE     CARDIAC CATHETERIZATION  03/2010    CORONARY STENT PLACEMENT X 2 PER PT; LM normal, LAD 70-80% distal lesion at apex, 20% lesion mid CFX    CARDIAC CATHETERIZATION  12/2012    CARDIAC DEFIBRILLATOR PLACEMENT  12/2012    [facility]    CARDIAC PACEMAKER PLACEMENT  2014    MEDTRONIC ICD/PACEMAKER PLACED  HEART    CIRCUMCISION  AGE 33    COLONOSCOPY  2009    CORRECTION HAMMER TOE      duodenal ulcer  1973    STRESS RELATED IN SCHOOL    ECHOCARDIOGRAM, TRANSTHORACIC  02/2016 11/2016 09/2017 12/2017    ECHOCARDIOGRAM, TRANSTHORACIC  02/2016,11/2016,12/20/2017    EGD N/A 08/15/2014    Procedure: EGD;  Surgeon: Colon Branch, MD;  Location: [facility];  Service: Gastroenterology;  Laterality: N/A;  egd w/ bx    EGD  1975    EGD, COLONOSCOPY N/A 04/04/2018    Procedure: EGD with bxs, COLONOSCOPY with polypectomy and clipping;  Surgeon: Doyne Keel, MD;  Location: [facility];  Service:  Gastroenterology;  Laterality: N/A;    FRACTURE SURGERY  AGE 63     RT  ANKLE- CAST APPLIED    HERNIA REPAIR  AGE 33    LEFT INGUINAL HERNIA    MPI nuclear study  06/2016    orthopedic surgery  AGE 63    RT foot corrective - HAMMERTOE    TONSILLECTOMY AND ADENOIDECTOMY  1954       Family History:  Family History   Problem Relation Age of Onset    Breast cancer Mother     Heart attack Mother 3    Hypertension Mother     Diabetes Mother     Coronary artery disease Mother     Diabetes Brother     Heart attack Father     Hypertension Father        Social History:  Social History     Socioeconomic History    Marital status: Married     Spouse name: Lajoyce Corners  Number of children: 0   Occupational History    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, history    Tobacco Use    Smoking status: Never    Smokeless tobacco: Never   Vaping Use    Vaping Use: Never used   Substance and Sexual Activity    Alcohol use: No     Alcohol/week: 0.0 standard drinks of alcohol     Comment: occasionally    Drug use: No    Sexual activity: Yes     Partners: Female        The following sections were reviewed this encounter by the provider:   Tobacco  Allergies  Meds  Problems  Med Hx  Surg Hx  Fam Hx         Vitals:  BP 98/62 (BP Site: Right arm, Patient Position: Sitting, Cuff Size: Medium)   Pulse 88   Temp 98.9 F (37.2 C) (Temporal)   Resp 20   Wt 96.4 kg (212 lb 9.6 oz)   SpO2 99%   BMI 28.83 kg/m     ROS:  Constitutional: Negative for fever, chills and malaise/fatigue.   Respiratory: Negative for cough, shortness of breath and wheezing.    Cardiovascular: Negative for chest pain and palpitations.   Gastrointestinal: Negative for abdominal pain, diarrhea and constipation.   Musculoskeletal: Negative for joint pain.   Skin: Negative for rash.   Neurological: Negative for dizziness and headaches.   Psychiatric/Behavioral: The patient does not have insomnia.         Objective:     Physical Exam:  Constitutional: Patient is oriented to  person, place, and time. Appears well-developed and well-nourished.   HENT:   Head: Normocephalic and atraumatic.   Eyes: Conjunctivae and EOM are normal. Pupils are equal, round, and reactive to light.   Cardiovascular: Normal rate, regular rhythm and normal heart sounds.  Exam reveals no gallop and no friction rub.    No murmur heard.  Pulmonary/Chest: Effort normal and breath sounds normal. Has no wheezes.   Musculoskeletal: Normal range of motion. Exhibits no edema or tenderness.   Neurological: Patient is alert and oriented to person, place, and time.   Psychiatric: Patient has a normal mood and affect.        Assessment/Plan:       1. Type 2 diabetes mellitus with complication, with long-term current use of insulin  - Continuous Blood Gluc Sensor (FreeStyle Libre 3 Sensor) Misc; Use 1 Device every 14 (fourteen) days  Dispense: 6 each; Refill: 3    2. Serum potassium elevated  - Comprehensive metabolic panel  - CBC and differential    3. Hospital discharge follow-up  Patient was in the hospital for evaluation of congestive heart failure.  Patient is scheduled with cardiology tomorrow.  He is also seen by his oncologist.  Diagnostic Results:   Prognosis: stable  Risks/Benefits of Treatment Options: Risks and benefits of therapy discussed with the patient.  Instructions for Management:   Compliance:   Risk Factor Reduction:   Patient/Family Education:   TOTAL TIME SPENT WITH PATIENT: 25 minutes  TIME SPENT COUNSELING/COORDINATING CARE: more than half the time was spent discussing diagnosis and treatment with the patient  >50% of total time spent with patient was regarding counseling/coordinating of care.        Zorita Pang, MD

## 2022-04-17 ENCOUNTER — Other Ambulatory Visit (INDEPENDENT_AMBULATORY_CARE_PROVIDER_SITE_OTHER): Payer: Self-pay | Admitting: Family Medicine

## 2022-04-17 DIAGNOSIS — R7989 Other specified abnormal findings of blood chemistry: Secondary | ICD-10-CM

## 2022-04-18 ENCOUNTER — Emergency Department
Admission: EM | Admit: 2022-04-18 | Discharge: 2022-04-18 | Disposition: A | Discharge status: Home / Self Care | Payer: Commercial Managed Care - POS | Attending: Emergency Medicine | Admitting: Emergency Medicine

## 2022-04-18 DIAGNOSIS — T783XXA Angioneurotic edema, initial encounter: Secondary | ICD-10-CM | POA: Insufficient documentation

## 2022-04-18 DIAGNOSIS — T7840XA Allergy, unspecified, initial encounter: Secondary | ICD-10-CM | POA: Insufficient documentation

## 2022-04-18 LAB — COMPREHENSIVE METABOLIC PANEL
ALT: 8 U/L (ref 0–55)
AST (SGOT): 9 U/L (ref 5–41)
Albumin/Globulin Ratio: 1 (ref 0.9–2.2)
Albumin: 3.2 g/dL — ABNORMAL LOW (ref 3.5–5.0)
Alkaline Phosphatase: 69 U/L (ref 37–117)
Anion Gap: 14 (ref 5.0–15.0)
BUN: 50 mg/dL — ABNORMAL HIGH (ref 9.0–28.0)
Bilirubin, Total: 1.3 mg/dL — ABNORMAL HIGH (ref 0.2–1.2)
CO2: 17 mEq/L (ref 17–29)
Calcium: 8.7 mg/dL (ref 7.9–10.2)
Chloride: 104 mEq/L (ref 99–111)
Creatinine: 3.1 mg/dL — ABNORMAL HIGH (ref 0.5–1.5)
Globulin: 3.2 g/dL (ref 2.0–3.6)
Glucose: 123 mg/dL — ABNORMAL HIGH (ref 70–100)
Potassium: 5.4 mEq/L — ABNORMAL HIGH (ref 3.5–5.3)
Protein, Total: 6.4 g/dL (ref 6.0–8.3)
Sodium: 135 mEq/L (ref 135–145)
eGFR: 19.9 mL/min/{1.73_m2} — AB (ref >=60)

## 2022-04-18 LAB — CBC AND DIFFERENTIAL
Absolute NRBC: 0 10*3/uL (ref 0.00–0.00)
Basophils Absolute Automated: 0.02 10*3/uL (ref 0.00–0.08)
Basophils Automated: 0.1 %
Eosinophils Absolute Automated: 0.1 10*3/uL (ref 0.00–0.44)
Eosinophils Automated: 0.6 %
Hematocrit: 35.1 % — ABNORMAL LOW (ref 37.6–49.6)
Hgb: 10.8 g/dL — ABNORMAL LOW (ref 12.5–17.1)
Immature Granulocytes Absolute: 0.05 10*3/uL (ref 0.00–0.07)
Immature Granulocytes: 0.3 %
Instrument Absolute Neutrophil Count: 3.47 10*3/uL (ref 1.10–6.33)
Lymphocytes Absolute Automated: 13.87 10*3/uL — ABNORMAL HIGH (ref 0.42–3.22)
Lymphocytes Automated: 78.7 %
MCH: 35 pg — ABNORMAL HIGH (ref 25.1–33.5)
MCHC: 30.8 g/dL — ABNORMAL LOW (ref 31.5–35.8)
MCV: 113.6 fL — ABNORMAL HIGH (ref 78.0–96.0)
MPV: 12.5 fL (ref 8.9–12.5)
Monocytes Absolute Automated: 0.12 10*3/uL — ABNORMAL LOW (ref 0.21–0.85)
Monocytes: 0.7 %
Neutrophils Absolute: 3.47 10*3/uL (ref 1.10–6.33)
Neutrophils: 19.6 %
Nucleated RBC: 0 /100 WBC (ref 0.0–0.0)
Platelets: 86 10*3/uL — ABNORMAL LOW (ref 142–346)
RBC: 3.09 10*6/uL — ABNORMAL LOW (ref 4.20–5.90)
RDW: 17 % — ABNORMAL HIGH (ref 11–15)
WBC: 17.63 10*3/uL — ABNORMAL HIGH (ref 3.10–9.50)

## 2022-04-18 LAB — CELL MORPHOLOGY
Cell Morphology: ABNORMAL — AB
Platelet Estimate: DECREASED — AB

## 2022-04-18 LAB — URINALYSIS REFLEX TO MICROSCOPIC EXAM - REFLEX TO CULTURE
Bilirubin, UA: NEGATIVE
Blood, UA: NEGATIVE
Glucose, UA: NEGATIVE
Ketones UA: 5 — AB
Leukocyte Esterase, UA: NEGATIVE
Nitrite, UA: NEGATIVE
Protein, UR: NEGATIVE
Specific Gravity UA: 1.024 (ref 1.001–1.035)
Urine pH: 5 (ref 5.0–8.0)
Urobilinogen, UA: 2 mg/dL (ref 0.2–2.0)

## 2022-04-18 LAB — PT AND APTT
PT INR: 1.5 — ABNORMAL HIGH (ref 0.9–1.1)
PT: 17.9 s — ABNORMAL HIGH (ref 10.1–12.9)
PTT: 34 s (ref 27–39)

## 2022-04-18 MED ORDER — LORATADINE 10 MG PO TABS
10.0000 mg | ORAL_TABLET | Freq: Every day | ORAL | 0 refills | Status: DC
Start: 2022-04-18 — End: 2023-02-10

## 2022-04-18 MED ORDER — EPINEPHRINE HCL 1 MG/ML ADULT ANAPHYLAXIS KIT
0.3000 mg | Freq: Once | INTRAMUSCULAR | Status: AC
Start: 2022-04-18 — End: 2022-04-18
  Administered 2022-04-18: 0.3 mg via INTRAMUSCULAR
  Filled 2022-04-18: qty 1

## 2022-04-18 MED ORDER — PREDNISONE 10 MG PO TABS
ORAL_TABLET | ORAL | 0 refills | Status: DC
Start: 2022-04-18 — End: 2022-07-24

## 2022-04-18 MED ORDER — DIPHENHYDRAMINE HCL 50 MG/ML IJ SOLN
25.0000 mg | Freq: Once | INTRAMUSCULAR | Status: AC
Start: 2022-04-18 — End: 2022-04-18
  Administered 2022-04-18: 25 mg via INTRAVENOUS
  Filled 2022-04-18: qty 1

## 2022-04-18 MED ORDER — FAMOTIDINE 10 MG/ML IV SOLN (WRAP)
20.0000 mg | Freq: Once | INTRAVENOUS | Status: AC
Start: 2022-04-18 — End: 2022-04-18
  Administered 2022-04-18: 20 mg via INTRAVENOUS
  Filled 2022-04-18: qty 2

## 2022-04-18 MED ORDER — METHYLPREDNISOLONE SODIUM SUCC 125 MG IJ SOLR (WRAP)
125.0000 mg | Freq: Once | INTRAMUSCULAR | Status: AC
Start: 2022-04-18 — End: 2022-04-18
  Administered 2022-04-18: 125 mg via INTRAVENOUS
  Filled 2022-04-18: qty 2

## 2022-04-18 MED ORDER — FAMOTIDINE 20 MG PO TABS
20.0000 mg | ORAL_TABLET | Freq: Every day | ORAL | 0 refills | Status: AC
Start: 2022-04-18 — End: 2022-04-22

## 2022-04-18 MED ORDER — EPINEPHRINE 0.3 MG/0.3ML IJ SOAJ
0.3000 mg | INTRAMUSCULAR | 0 refills | Status: DC | PRN
Start: 2022-04-18 — End: 2022-09-09

## 2022-04-18 NOTE — Progress Notes (Signed)
Patient here with allergic reaction.  Referral sent to [facility] for ED to home visit.  Resources added to AVS.  Patient still being evaluated in emergency department.  Plan pending progress.  CM is available for any needs.

## 2022-04-18 NOTE — ED Provider Notes (Signed)
[facility] EMERGENCY DEPARTMENT  ATTENDING PHYSICIAN HISTORY AND PHYSICAL EXAM     Patient Name: [name] [name], [name] [name]  Department:[facility] [place]  Encounter Date:  04/18/2022  Attending Physician: Earline Mayotte, MD   Age: 76 y.o. male  Patient Room: 06/06  PCP: Zorita Pang, MD           Diagnosis/Disposition:     Final diagnoses:   Angioedema, initial encounter   Allergic reaction, initial encounter       ED Disposition       ED Disposition   Discharge Boarder to Home    Condition   --    Date/Time   Sat Apr 18, 2022 10:36 PM    Comment   --               Follow-Up Providers (if applicable)    [place]  [address] [facility]  [address]  [place] [address]  [phone]      Call Monday to discuss treatment options for chemo.       New Prescriptions    EPINEPHRINE 0.3 MG/0.3ML SOLUTION AUTO-INJECTOR INJECTION    Inject 0.3 mLs (0.3 mg) into the muscle as needed (for Anaphylaxis)    FAMOTIDINE (PEPCID) 20 MG TABLET    Take 1 tablet (20 mg) by mouth daily for 4 days    LORATADINE (CLARITIN) 10 MG TABLET    Take 1 tablet (10 mg) by mouth daily    PREDNISONE (DELTASONE) 10 MG TABLET    4 tabs once a day for 2 days then 3 tabs once a day for 2 days then 2 tabs once a day for 2 days then 1 tab once a day for 2 days             Medical Decision Making:       ED course/Plan:  Labs, epinephrine, Benadryl, Solu-Medrol, Pepcid, observation    At the time of discharge patient's symptoms had resolved.  No more lip swelling or angioedema.  Patient understands to discontinue his chemotherapeutic medication.    ED Course as of 04/18/22 2248   Sat Apr 18, 2022   1924 On reassessment, patient has some improvement in the swelling of the lips as well as angioedema.  Still present. [JP]   2240 Discussed Dr. Elodia Florence agreed that patient can stop his current chemotherapeutic.  We will follow-up with patient on Monday. [JP]      ED Course User Index  [JP] Earline Mayotte, MD           Number and Complexity of Problems Addressed (select  at least one)    Complexity: High: 1 acute or chronic illness or injury that poses a threat to life or bodily function      Presenting acute/chronic problems: Rash, swelling of the lips and throat    Differential diagnosis to include but not limited to: Allergic reaction, HDMI, anaphylaxis, drug reaction    Chronic illness impacting care and increasing risk of acute/chronic problems (obesity based on bmi >30, diabetes, hypertension, elderly - 65 or older):      ______________________________________________________________________  Amount/complexity of Data Reviewed   L\  Look below for information    History obtained from another historian -see HPI or here (parent, spouse,  care giver, ems) : Spouse reports that swelling of lip started yesterday, rash started the day before.    Review of older external records from N/A and found this relevant information: N/A  Independent visualized and interpretation of radiological study by me:     Type of radiological study performed : N/A  Independent Interpretation by me: N/A    Diagnostic tests appropriately considered even if not ultimately performed: N/A    Discussion of management with other physicians and/or other caretakers:   Discussion with as above and recommendation(s) include as above. Patient condition and all pertinent labs and/or radiology studies discussed.     ______________________________________________________________________    Risk of Complications and/or Morbidity or Mortality of Patient Management  (select one if applicable)    Risk: High                                    History of Presenting Illness:     Nursing Triage note: started calquence on 6/15 or chemo and developing facial swelling, significant lip swelling and rash diffusly. 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   States initially noticed rash over the extremities which subsequently spread over the abdomen, chest, legs.  Yesterday started having swelling of the lips and throat.  Had taken Benadryl without relief of symptoms most recently at 11:30 AM today.  Patient recently started on new chemotherapeutic called Acalabrutinib.          Review of Systems:  Physical Exam:     Review of Systems   Constitutional:  Negative for chills and fever.   HENT:  Positive for trouble swallowing.    Respiratory:  Negative for cough and shortness of breath.    Cardiovascular:  Negative for chest pain and palpitations.   Gastrointestinal:  Negative for abdominal pain and vomiting.   All other systems reviewed and are negative.        Pulse (!) 117  BP 119/54  Resp 22  SpO2 99 %  Temp 99.4 F (37.4 C)     Physical Exam  Constitutional:       Appearance: Normal appearance.   HENT:      Head: Normocephalic and atraumatic.      Mouth/Throat:      Mouth: Mucous membranes are moist.      Comments: Edema noted over the lips as well as a uvula  Eyes:      General: No scleral icterus.        Right eye: No discharge.         Left eye: No discharge.      Conjunctiva/sclera: Conjunctivae normal.   Cardiovascular:      Rate and Rhythm: Normal rate and regular rhythm.      Heart sounds: No murmur heard.  Pulmonary:      Effort: Pulmonary effort is normal. No respiratory distress.      Breath sounds: Normal breath sounds. No wheezing, rhonchi or rales.   Abdominal:      General: Abdomen is flat. There is no distension.      Palpations: Abdomen is soft.      Tenderness: There is no abdominal tenderness. There is no guarding or rebound.   Musculoskeletal:      Cervical back: Normal range of motion and neck supple.   Skin:     Findings: Erythema and rash present. Rash is purpuric.      Comments: Urticarial.    Rash noted  over the extremities, chest, abdomen, legs   Neurological:      General: No focal deficit present.      Mental Status: He is alert and  oriented to person, place, and time.   Psychiatric:         Mood and Affect: Mood normal.         Behavior: Behavior normal.              Interpretations, Clinical Decision Tools and Critical Care:              Results       Procedure Component Value Units Date/Time    Cell MorpHology [[id number]]  (Abnormal) Collected: 04/18/22 1908     Updated: 04/18/22 1953     Cell Morphology Abnormal     Platelet Estimate Decreased     Macrocytic 2+     Smudge Cells Present    CBC and differential [[id number]]  (Abnormal) Collected: 04/18/22 1908     Updated: 04/18/22 1953     WBC 17.63 x10 3/uL      Hgb 10.8 g/dL      Hematocrit 17.6 %      Platelets 86 x10 3/uL      RBC 3.09 x10 6/uL      MCV 113.6 fL      MCH 35.0 pg      MCHC 30.8 g/dL      RDW 17 %      MPV 12.5 fL      Instrument Absolute Neutrophil Count 3.47 x10 3/uL      Neutrophils 19.6 %      Lymphocytes Automated 78.7 %      Monocytes 0.7 %      Eosinophils Automated 0.6 %      Basophils Automated 0.1 %      Immature Granulocytes 0.3 %      Nucleated RBC 0.0 /100 WBC      Neutrophils Absolute 3.47 x10 3/uL      Lymphocytes Absolute Automated 13.87 x10 3/uL      Monocytes Absolute Automated 0.12 x10 3/uL      Eosinophils Absolute Automated 0.10 x10 3/uL      Basophils Absolute Automated 0.02 x10 3/uL      Immature Granulocytes Absolute 0.05 x10 3/uL      Absolute NRBC 0.00 x10 3/uL     PT/APTT [[id number]]  (Abnormal) Collected: 04/18/22 1806     Updated: 04/18/22 1839     PT 17.9 sec      PT INR 1.5     PTT 34 sec     Comprehensive metabolic panel [[id number]]  (Abnormal) Collected: 04/18/22 1806    Specimen: Blood Updated: 04/18/22 1838     Glucose 123 mg/dL      BUN 70.3 mg/dL      Creatinine 3.1 mg/dL      Sodium 500 mEq/L      Potassium 5.4 mEq/L      Chloride 104 mEq/L      CO2 17 mEq/L      Calcium 8.7 mg/dL      Protein, Total 6.4 g/dL      Albumin 3.2 g/dL      AST (SGOT) 9 U/L      ALT 8 U/L      Alkaline Phosphatase 69 U/L      Bilirubin, Total 1.3 mg/dL       Globulin 3.2 g/dL      Albumin/Globulin Ratio 1.0  Anion Gap 14.0     eGFR 19.9 mL/min/1.73 m2     Urinalysis Reflex to Microscopic Exam- Reflex to Culture [[id number]]  (Abnormal) Collected: 04/18/22 1806    Specimen: Urine, Clean Catch Updated: 04/18/22 1821     Urine Type Urine, Clean Ca     Color, UA Yellow     Clarity, UA Sl Cloudy     Specific Gravity UA 1.024     Urine pH 5.0     Leukocyte Esterase, UA Negative     Nitrite, UA Negative     Protein, UR Negative     Glucose, UA Negative     Ketones UA 5     Urobilinogen, UA 2.0 mg/dL      Bilirubin, UA Negative     Blood, UA Negative            Radiology Results (24 Hour)       ** No results found for the last 24 hours. **            *This note was generated by the Epic EMR system/Voice recognition system and may contain inherent errors or omissions not intended by the user. Grammatical errors, random word insertions, deletions, pronoun errors and incomplete sentences are occasional consequences of this technology due to software limitations. Not all errors are caught or corrected. If there are questions or concerns about the content of this note or information contained within the body of this dictation they should be addressed directly with the author for clarification.            Procedures:   Procedures

## 2022-04-18 NOTE — Discharge Instructions (Signed)
_____________________________________    Senior Resources    Area on Aging:   Provides Case Management services, Disease Prevention and Health promotion, Home Delivered Meals, Information and Assistance, [place] Long-Term Ombudsman Program, Tax Preparation Assistance, and [place] Insurance Counseling and Assistance Program     Aging Programs and Services [place], [place]   [phone]  TTY [phone]  [facility], [place]   [phone]  [facility] [place], [place] [phone]  [place] [facility] [place], [place]  [phone]  Adult Day Care Center [place], [place]   [phone]  [facility]ult Day [facility] Center [place], [place]   [phone] or [phone]  Other Day Programs:   [facility]     [phone]    Life Line:   [place] [facility]        800-LIFELINE (543-3546)  Link to Life        800-338-4176  [facility] Medical Alert ([place] )  571-283-1328 or novamedicalalert.com  [employer] (VRI)    800-921-2008    [place] Information Line for Health and Human Services  2-1-1    VICAP ([place] Insurance Counseling and Assistance Program)  800-552-3402     Health Insurance Exchange      800-318-2596 or https://localhelp.healthcare.gov/    Retirement Living Source Book     703-992-1102 or 800-394-9990 x1102   Call for your FREE guide on Nursing Homes, Assisted Living, Retirement Homes, Day Cares, and other Elderly Resources    Resource Placement/Finding Agency (No Cost)  A Family Tie     [phone]  A Place for Mom    [phone]  [facility]     [phone]    [place] [facility] Services  [phone]   County Social Services [phone]  [place] [facility] Services  [phone]    Alzheimer's Association   800-272-3900  Medicare     800-MEDICARE (633-4227)  TTY [phone]  Social Security    800-772-1213    Transportation:   [place] [employer] Bus Services (Need 48 hours  notice)    [phone]  [place] [facility] (Medical transport for frail elderly and disabled)  [phone]  [employer] (Medicaid Recipients ONLY)      [phone]  AAA transport (Stretcher and Wheelchair transport call for rates)   301-952-1193 option 2  H&M transport (Wheelchair transport call for rates)     [phone]  Home Helpers (Companion Transports Call for rates)     [phone]  [facility]                                                                                           [phone]   [facility]                                                                                                                         [phone]  [place] [facility]  [phone]  [employer] bus services  [phone]  APS [phone]      PRIMARY CARE PHYSICIANS that do HOUSECALLS:   [facility] P[facility]   [phone]    [facility]    [phone]    [facility]     [phone]    Need to check zipcode eligibility    Need to be >65 years old     Check Insurance accepted  [facility] [phone]  [facility] (accepts Medicare) [phone]      Privately Paid Caregiver Services  [employer]ters [phone]  Elder Options [phone]  [facility] at home [phone]  [facility] [phone]  [facility] [phone]  Home Instead [phone]  [facility]  [phone]  [facility]  [phone]  Right at Home  [phone]  Care with Love [phone]  Caregivers [phone]  PHS [phone]  [place] [facility] [phone]  Home Helpers [phone]    Meal Services  [facility] [place] [phone]  Moms Meals  mommeals.com    Veterans  [place] veterans services [phone]  [place].gov    Medication Packing Programs    Pill Pack     www.pillpack.com     [phone]  Simple Dose     [email or web address]/content/pharmacy/simpledose     800-753-0596  Accu Pac     www.accupacrx.com     [phone]  Hero smart dispenser for medications     [phone]      herohealth.com  Medication Minder  medminder.com

## 2022-04-20 LAB — CBC PATHOLOGIST REVIEW

## 2022-04-29 ENCOUNTER — Encounter (INDEPENDENT_AMBULATORY_CARE_PROVIDER_SITE_OTHER): Payer: Self-pay | Admitting: Family Medicine

## 2022-05-01 ENCOUNTER — Other Ambulatory Visit: Payer: Self-pay | Admitting: Registered Nurse

## 2022-05-01 ENCOUNTER — Ambulatory Visit
Admission: RE | Admit: 2022-05-01 | Discharge: 2022-05-01 | Disposition: A | Discharge status: Home / Self Care | Payer: Commercial Managed Care - POS | Source: Ambulatory Visit | Attending: Registered Nurse | Admitting: Registered Nurse

## 2022-05-01 DIAGNOSIS — I824Z1 Acute embolism and thrombosis of unspecified deep veins of right distal lower extremity: Secondary | ICD-10-CM | POA: Insufficient documentation

## 2022-05-04 ENCOUNTER — Other Ambulatory Visit (INDEPENDENT_AMBULATORY_CARE_PROVIDER_SITE_OTHER): Payer: Commercial Managed Care - POS | Admitting: Cardiovascular Disease

## 2022-05-04 LAB — REMOTE CARDIAC DEVICE MONITORING
AF Burden Percentage: 0
ICD Shock Recent Count: 0
RV Pacing Percentage: 11.9

## 2022-05-08 ENCOUNTER — Encounter (INDEPENDENT_AMBULATORY_CARE_PROVIDER_SITE_OTHER): Payer: Commercial Managed Care - POS | Admitting: Nurse Practitioner

## 2022-05-08 NOTE — Progress Notes (Deleted)
[place] [facility] OFFICE PROGRESS NOTE    HRT [facility] OFFICE -CARDIOLOGY  [address]  [place] [address]  Dept: [phone]  Dept Fax: [phone]       Patient Name: [name] [name]    Date of Visit:  May 08, 2022  Date of Birth: [birth year]  AGE: 76 y.o.  Medical Record #: [id number]      CHIEF COMPLAINT:    No chief complaint on file.      HISTORY OF PRESENT ILLNESS:    I had the pleasure of seeing Mr. Jachai Okazaki today in follow up in the Advanced Heart Failure Clinic at [facility]. He is a pleasant 76 y.o. 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  For HF GDMT, he was previously on carvedilol 25 mg twice daily, Entresto 97/23 mg twice daily, Jardiance 10 mg daily, and spironolactone 12.5 mg daily. He has a Medtronic ICD in place.     He had a recent hospitalization at [facility] 03/09/2022 for worsening swelling, scrotal edema and shortness of breath. Chest x-ray showed no acute cardiopulmonary process.  He had an ultrasound of the scrotum and testicles, showed moderate size, left high steel scrotal wall edema. 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 Per Dr. Laurine Blazer from [facility] there was discussion regarding treatment with BTK inhibitors or venetoclax. Seen by urology, who did not recommend surgery at this time.  He was also found to have right lower extremity DVT now on Eliquis for anticoagulation.     At last visit with Dr. Webb Silversmith, valsartan dose was reduced to 20 mg daily dosing to due to recent hyperkalemia.  He also followed up with Dr. Cam Hai at [facility] and was started on a new chemotherapy drug called Acalabrutinib.  Unfortunately, this was recently discontinued after being evaluated in the ED for an allergic reaction (rash, swelling in lips and throat).  Had a recent visit at [facility] and notes indicate the reaction was likely related to allopurinol rather than the chemotherapy drug.  There are tentative plans to rechallenge with Acalabrutinib alone.     At follow-up today, reports stable symptoms since discharge.  Average blood pressures are now in the systolic 110s to 295M.  Denies any worsening shortness of breath, orthopnea, PND, or edema.  On his home scale, he is weighing around 190 pounds.      PAST MEDICAL HISTORY: He has a past medical history of Acute CHF, Acute systolic (congestive) heart failure (08/2017), Anxiety, CAD (coronary artery disease), Cardiomyopathy, CHF (congestive heart failure) (08/12/2014, 2013), Chronic obstructive pulmonary disease, Coronary artery disease (2011), Depression, echocardiogram (02/2016, 11/2016, 09/2017, 12/20/2017), GERD (gastroesophageal reflux disease), Heart attack (2011), Hyperlipemia, Hypertension, ICD (implantable 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, Ischemic cardiomyopathy, Nonischemic cardiomyopathy, NSTEMI (non-ST elevated myocardial infarction), 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 He has a past surgical history that includes duodenal ulcer (1973); orthopedic surgery (AGE 67); EGD (N/A, 08/15/2014); Circumcision (AGE 43); Fracture surgery (AGE 44); Hernia repair (AGE 43); Colonoscopy (2009); Tonsillectomy and adenoidectomy (1954); Cardiac defibrillator placement (12/2012); Cardiac pacemaker placement (2014); EGD (1975); EGD, COLONOSCOPY (N/A, 04/04/2018); ECHOCARDIOGRAM, TRANSTHORACIC (02/2016 11/2016 09/2017 12/2017); MPI nuclear study (06/2016); Correction hammer toe; BIV (11/10/2016); ECHOCARDIOGRAM, TRANSTHORACIC (02/2016,11/2016,12/20/2017); Cardiac catheterization (03/2010); and Cardiac catheterization (12/2012).  ALLERGIES: No Known Allergies    MEDICATIONS:   Current Outpatient  Medications:     apixaban (ELIQUIS) 5 MG, Take 2 tablets (10 mg) by mouth every 12 (twelve) hours For 1 week followed by 1 tablet twice daily, Disp: 90 tablet, Rfl: 2    aspirin EC 81 MG EC tablet, Take 1 tablet (81 mg) by mouth daily, Disp: , Rfl:     atorvastatin (LIPITOR) 40 MG tablet, Take 1 tablet (40 mg total) by mouth nightly, Disp: 90 tablet, Rfl: 0    Calquence 100 MG Tab, , Disp: , Rfl:     Continuous Blood Gluc Sensor (FreeStyle Libre 3 Sensor) Misc, Use 1 Device every 14 (fourteen) days, Disp: 6 each, Rfl: 3    empagliflozin (JARDIANCE) 10 MG tablet, Take 1 tablet (10 mg total) by mouth daily, Disp: 90 tablet, Rfl: 3    EPINEPHrine 0.3 MG/0.3ML Solution Auto-injector injection, Inject 0.3 mLs (0.3 mg) into the muscle as needed (for Anaphylaxis), Disp: 2 each, Rfl: 0    fluticasone-salmeterol (ADVAIR DISKUS) 100-50 MCG/ACT Aerosol Pwdr, Breath Activated, Inhale 1 puff into the lungs 2 (two) times daily, Disp: 3 each, Rfl: 3    glucose blood (ONE TOUCH ULTRA TEST) test strip, 1 each by Other route 2 (two) times daily Use as instructed, Disp: 300 each, Rfl: 1    insulin glargine (LANTUS SOLOSTAR) 100 UNIT/ML injection pen, Patient takes 50 units at night, Disp: 45 mL, Rfl: 3    Insulin Pen Needle (BD Pen Needle Nano U/F) 32G X 4 MM Misc, Use as directed bid, Disp: 200 each, Rfl: 3    loratadine (CLARITIN) 10 MG tablet, Take 1 tablet (10 mg) by mouth daily, Disp: 5 tablet, Rfl: 0    metoprolol succinate XL (TOPROL-XL) 25 MG 24 hr tablet, Take 0.5 tablets (12.5 mg) by mouth daily, Disp: 30 tablet, Rfl: 2    niacin (NIASPAN) 500 MG CR tablet, TAKE 1 TABLET BY MOUTH TWICE DAILY, Disp: 180 tablet, Rfl: 3    PARoxetine (Paxil) 20 MG tablet, Take 1 tablet (20 mg total) by mouth every morning, Disp: 30 tablet, Rfl: 11    predniSONE (DELTASONE) 10 MG tablet, 4 tabs once a day for 2 days then 3 tabs once a day for 2 days then 2 tabs once a day for 2 days then 1 tab once a day for 2 days, Disp: 20 tablet, Rfl: 0     Semglee, yfgn, 100 UNIT/ML Solution Pen-injector, ADMINISTER 50 UNITS UNDER THE SKIN AT BEDTIME, Disp: 45 mL, Rfl: 3    SITagliptin-metFORMIN (Janumet) 50-500 MG tablet, Take 1 tablet by mouth 2 (two) times daily with meals, Disp: 60 tablet, Rfl: 5    torsemide (DEMADEX) 20 MG tablet, TAKE 3 TABLETS BY MOUTH EVERY MORNING (Patient taking differently: 2 tablets in am 1 tablets in pm), Disp: 270 tablet, Rfl: 3    UNABLE TO FIND, Med Name: Modal, Disp: , Rfl:     valsartan (DIOVAN) 40 MG tablet, Take 0.5 tablets (20 mg) by mouth daily, Disp: 90 tablet, Rfl: 3     FAMILY HISTORY: family history includes Breast cancer in his mother; Coronary artery disease in his mother; Diabetes in his brother and mother; Heart attack in his father; Heart attack (age of onset: 68) in his mother; Hypertension in his father and mother.    SOCIAL HISTORY: He reports that he has never smoked. He has never used smokeless tobacco. He reports that he does not currently use alcohol. He  reports that he does not use drugs.      PHYSICAL EXAMINATION    There were no vitals taken for this visit.      Last 3 Weights:       04/08/2022    11:03 AM 04/16/2022     1:20 PM 04/18/2022     4:26 PM   Weight Monitoring   Height 182.9 cm  182.9 cm   Weight 88.905 kg 96.435 kg 93.441 kg   BMI (calculated) 26.6 kg/m2  28 kg/m2         General Appearance:  Cooperative and in no acute distress.    Skin: Warm and dry to touch  Head: Normocephalic  Eyes: Conjunctivae and lids unremarkable.    Neck: No JVD  Chest: Clear to auscultation bilaterally with good respiratory effort and no wheezes, rales, or rhonchi   Cardiovascular: Regular rhythm, S1 normal, S2 normal, No S3 or S4  Abdomen: Soft, nontender with normoactive bowel sounds.   Extremities: Warm/dry without edema, +2 b/l radial pulses  Neuro: Alert and oriented x3. No gross motor or sensory deficits noted, affect appropriate.      IMPRESSION:   Mr. Colter is a 76 y.o. male with the following problems:    Recent  admission for acute on chronic systolic CHF and scrotal edema  EF 25 to 30% by echo August 2022.  Moderate tricuspid and mitral regurgitation present.  PA pressures were 54 mmHg.   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   Continue Torsemide 40mg  in AM and 20mg  in afternoon   Request recent office visit note from [place]. He was recently seen by Dr. Laurine Blazer for follow up and was started on treatment for his CLL. Suspect the scrotal edema will improve/resolve once enlarged lymph nodes are treated  Repeat BMP prior to next visit   Consider uptitration of GDMT at next visit- either BB or ARB   Keep scheduled visit with Dr. Webb Silversmith, or sooner if needed                                                  No orders of the defined types were placed in this encounter.      No orders of the defined types were placed in this encounter.        SIGNED:    Conception Oms, NP          This note was generated by the Dragon speech recognition and may contain errors or omissions not intended by the user. Grammatical errors, random word insertions, deletions, pronoun errors, and incomplete sentences are occasional consequences of this technology due to software limitations. Not all errors are caught or corrected. If there are questions or concerns about the content of this note or information contained within the body of this dictation, they should be addressed directly with the author for clarification.

## 2022-05-12 ENCOUNTER — Encounter (INDEPENDENT_AMBULATORY_CARE_PROVIDER_SITE_OTHER): Payer: Self-pay

## 2022-05-16 ENCOUNTER — Encounter (INDEPENDENT_AMBULATORY_CARE_PROVIDER_SITE_OTHER): Payer: Self-pay | Admitting: Family Medicine

## 2022-05-20 ENCOUNTER — Encounter (INDEPENDENT_AMBULATORY_CARE_PROVIDER_SITE_OTHER): Payer: Self-pay | Admitting: Family Medicine

## 2022-05-22 ENCOUNTER — Ambulatory Visit (INDEPENDENT_AMBULATORY_CARE_PROVIDER_SITE_OTHER): Payer: Commercial Managed Care - POS | Admitting: Nurse Practitioner

## 2022-05-22 ENCOUNTER — Encounter (INDEPENDENT_AMBULATORY_CARE_PROVIDER_SITE_OTHER): Payer: Self-pay | Admitting: Nurse Practitioner

## 2022-05-22 DIAGNOSIS — I428 Other cardiomyopathies: Secondary | ICD-10-CM

## 2022-05-22 DIAGNOSIS — I5022 Chronic systolic (congestive) heart failure: Secondary | ICD-10-CM

## 2022-05-22 MED ORDER — METOPROLOL SUCCINATE ER 25 MG PO TB24
12.5000 mg | ORAL_TABLET | Freq: Every day | ORAL | 3 refills | Status: DC
Start: 2022-05-22 — End: 2023-01-04

## 2022-05-22 NOTE — Progress Notes (Signed)
[place] [facility] ADVANCED HE[facility] FAILURE OFFICE PROGRESS NOTE    HRT [facility] OFFICE -CARDIOLOGY  [address]  [place] [address]  Dept: [phone]  Dept Fax: [phone]       Patient Name: [name] [name]    Date of Visit:  May 22, 2022  Date of Birth: 1946-10-07  AGE: 76 y.o.  Medical Record #: [id number]      CHIEF COMPLAINT:    Chief Complaint   Patient presents with    Nonischemic cardiomyopathy    Follow-up    Congestive Heart Failure       HISTORY OF PRESENT ILLNESS:    I had the pleasure of seeing [name] [name] today in follow up in the Advanced Heart Failure Clinic at [facility]. He is a pleasant 76 y.o. 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  For HF GDMT, he was previously on carvedilol 25 mg twice daily, Entresto 97/23 mg twice daily, Jardiance 10 mg daily, and spironolactone 12.5 mg daily. He has a Medtronic ICD in place.     Recently had a hospitalization back in May for worsening swelling, scrotal edema and shortness of breath.  He was given IV diuresis with improvement in breathing and lower extremity edema.  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 Per Dr. Laurine Blazer from [facility] there was discussion regarding treatment with BTK inhibitors or venetoclax. Seen by urology, who did not recommend surgery at this time.  He was also found to have right lower extremity DVT now on Eliquis for anticoagulation. During his hospital course, spironolactone was discontinued for hyperkalemia. He also briefly required pressors which felt to be related to hypovolemia.     He had a ED visit in July for angioedema and swelling in the lips after starting allopurinol and acalabrutinib. He then followed up with [facility] who felt reaction was likely related to allopurinol given prior studies reported.  At follow-up appointment on 05/07/2022  he was rechallenged with acalabrutinib. Has been tolerating it well without any recurrent rash or swelling. Cr elevated at 3.1 during ED evaluation.     At follow-up today, reports stable symptoms overall. Blood pressures have been stable around 110s. He has not been on toprol XL.  States LE edema better.  Denies any worsening shortness of breath, orthopnea, PND, or edema.  Still has some lingering fatigue.     PAST MEDICAL HISTORY: He has a past medical history of Acute CHF, Acute systolic (congestive) heart failure (08/2017), Anxiety, CAD (coronary artery disease), Cardiomyopathy, CHF (congestive heart failure) (08/12/2014, 2013), Chronic obstructive pulmonary disease, Coronary artery disease (2011), Depression, echocardiogram (02/2016, 11/2016, 09/2017, 12/20/2017), GERD (gastroesophageal reflux disease), Heart attack (2011), Hyperlipemia, Hypertension, ICD (implantable 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, Ischemic cardiomyopathy, Nonischemic cardiomyopathy, NSTEMI (non-ST elevated myocardial infarction), 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 He has a past surgical history that includes duodenal ulcer (1973); orthopedic surgery (AGE 89); EGD (N/A, 08/15/2014); Circumcision (AGE 42); Fracture surgery (AGE 66); Hernia repair (AGE 42); Colonoscopy (2009); Tonsillectomy and adenoidectomy (1954); Cardiac defibrillator placement (12/2012); Cardiac pacemaker placement (2014); EGD (1975); EGD, COLONOSCOPY (N/A, 04/04/2018); ECHOCARDIOGRAM, TRANSTHORACIC (02/2016 11/2016 09/2017 12/2017); MPI nuclear study (06/2016); Correction hammer toe; BIV (11/10/2016); ECHOCARDIOGRAM, TRANSTHORACIC (02/2016,11/2016,12/20/2017); Cardiac catheterization (03/2010); and Cardiac catheterization (12/2012).    ALLERGIES: No Known Allergies  MEDICATIONS:   Current Outpatient Medications:      apixaban (ELIQUIS) 5 MG, Take 2 tablets (10 mg) by mouth every 12 (twelve) hours For 1 week followed by 1 tablet twice daily, Disp: 90 tablet, Rfl: 2    aspirin EC 81 MG EC tablet, Take 1 tablet (81 mg) by mouth daily, Disp: , Rfl:     atorvastatin (LIPITOR) 40 MG tablet, Take 1 tablet (40 mg total) by mouth nightly, Disp: 90 tablet, Rfl: 0    Calquence 100 MG Tab, , Disp: , Rfl:     Continuous Blood Gluc Sensor (FreeStyle Libre 3 Sensor) Misc, Use 1 Device every 14 (fourteen) days, Disp: 6 each, Rfl: 3    empagliflozin (JARDIANCE) 10 MG tablet, Take 1 tablet (10 mg total) by mouth daily, Disp: 90 tablet, Rfl: 3    EPINEPHrine 0.3 MG/0.3ML Solution Auto-injector injection, Inject 0.3 mLs (0.3 mg) into the muscle as needed (for Anaphylaxis), Disp: 2 each, Rfl: 0    fluticasone-salmeterol (ADVAIR DISKUS) 100-50 MCG/ACT Aerosol Pwdr, Breath Activated, Inhale 1 puff into the lungs 2 (two) times daily, Disp: 3 each, Rfl: 3    glucose blood (ONE TOUCH ULTRA TEST) test strip, 1 each by Other route 2 (two) times daily Use as instructed, Disp: 300 each, Rfl: 1    insulin glargine (LANTUS SOLOSTAR) 100 UNIT/ML injection pen, Patient takes 50 units at night, Disp: 45 mL, Rfl: 3    Insulin Pen Needle (BD Pen Needle Nano U/F) 32G X 4 MM Misc, Use as directed bid, Disp: 200 each, Rfl: 3    loratadine (CLARITIN) 10 MG tablet, Take 1 tablet (10 mg) by mouth daily, Disp: 5 tablet, Rfl: 0    niacin (NIASPAN) 500 MG CR tablet, TAKE 1 TABLET BY MOUTH TWICE DAILY, Disp: 180 tablet, Rfl: 3    predniSONE (DELTASONE) 10 MG tablet, 4 tabs once a day for 2 days then 3 tabs once a day for 2 days then 2 tabs once a day for 2 days then 1 tab once a day for 2 days, Disp: 20 tablet, Rfl: 0    Semglee, yfgn, 100 UNIT/ML Solution Pen-injector, ADMINISTER 50 UNITS UNDER THE SKIN AT BEDTIME, Disp: 45 mL, Rfl: 3    SITagliptin-metFORMIN (Janumet) 50-500 MG tablet, Take 1 tablet by mouth 2 (two) times daily with meals, Disp: 60 tablet, Rfl: 5     torsemide (DEMADEX) 20 MG tablet, TAKE 3 TABLETS BY MOUTH EVERY MORNING (Patient taking differently: 2 tablets in am 1 tablets in pm), Disp: 270 tablet, Rfl: 3    valsartan (DIOVAN) 40 MG tablet, Take 0.5 tablets (20 mg) by mouth daily, Disp: 90 tablet, Rfl: 3    metoprolol succinate XL (TOPROL-XL) 25 MG 24 hr tablet, Take 0.5 tablets (12.5 mg) by mouth daily, Disp: 45 tablet, Rfl: 3    PARoxetine (Paxil) 20 MG tablet, Take 1 tablet (20 mg total) by mouth every morning (Patient not taking: Reported on 05/22/2022), Disp: 30 tablet, Rfl: 11    UNABLE TO FIND, Med Name: Modal (Patient not taking: Reported on 05/22/2022), Disp: , Rfl:      FAMILY HISTORY: family history includes Breast cancer in his mother; Coronary artery disease in his mother; Diabetes in his brother and mother; Heart attack in his father; Heart attack (age of onset: 90) in his mother; Hypertension in his father and mother.    SOCIAL HISTORY: He reports that he has never smoked. He has never used smokeless tobacco. He reports that he does  not currently use alcohol. He reports that he does not use drugs.      PHYSICAL EXAMINATION    Visit Vitals  BP 120/70 (BP Site: Right arm, Patient Position: Sitting, Cuff Size: Medium)   Pulse (!) 102   Ht 1.829 m (6')   Wt 92.1 kg (203 lb)   BMI 27.53 kg/m         Last 3 Weights:       04/16/2022     1:20 PM 04/18/2022     4:26 PM 05/22/2022     2:16 PM   Weight Monitoring   Height  182.9 cm 182.9 cm   Weight 96.435 kg 93.441 kg 92.08 kg   BMI (calculated)  28 kg/m2 27.6 kg/m2         General Appearance:  Cooperative and in no acute distress.    Skin: Warm and dry to touch  Head: Normocephalic  Eyes: Conjunctivae and lids unremarkable.    Neck: No JVD  Chest: Clear to auscultation bilaterally with good respiratory effort and no wheezes, rales, or rhonchi   Cardiovascular: Regular rhythm, S1 normal, S2 normal, No S3 or S4  Abdomen: Soft, nontender with normoactive bowel sounds.   Extremities: Warm/dry without edema, +2 b/l  radial pulses  Neuro: Alert and oriented x3. No gross motor or sensory deficits noted, affect appropriate.      IMPRESSION:   [name] [name] is a 76 y.o. male with the following problems:    Recent admission for acute on chronic systolic CHF and scrotal edema  EF 25 to 30% by echo August 2022.  Moderate tricuspid and mitral regurgitation present.  PA pressures were 54 mmHg.   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 Drucie Opitz cancer specialist as scheduled  Repeat BMP to repeat renal function  Follow-up with [place] in 4 weeks, or sooner if needed                                                  Orders Placed This Encounter   Procedures    Basic Metabolic Panel    Advanced Heart Failure APP Visit (HRT [place])       Orders Placed This Encounter   Medications    metoprolol succinate XL (TOPROL-XL) 25 MG 24 hr tablet     Sig: Take 0.5 tablets (12.5 mg) by mouth daily     Dispense:  45 tablet     Refill:  3         SIGNED:    Conception Oms, NP          This note was generated by the Dragon speech recognition and may contain errors or omissions not intended by the user. Grammatical errors, random word insertions, deletions, pronoun errors, and  incomplete sentences are occasional consequences of this technology due to software limitations. Not all errors are caught or corrected. If there are questions or concerns about the content of this note or information contained within the  body of this dictation, they should be addressed directly with the author for clarification.

## 2022-05-25 ENCOUNTER — Other Ambulatory Visit (FREE_STANDING_LABORATORY_FACILITY): Payer: Commercial Managed Care - POS

## 2022-05-25 DIAGNOSIS — I428 Other cardiomyopathies: Secondary | ICD-10-CM

## 2022-05-25 LAB — HEMOLYSIS INDEX: Hemolysis Index: 72 Index — ABNORMAL HIGH (ref 0–24)

## 2022-05-25 LAB — BASIC METABOLIC PANEL
Anion Gap: 13 (ref 5.0–15.0)
BUN: 34 mg/dL — ABNORMAL HIGH (ref 9.0–28.0)
CO2: 20 mEq/L (ref 17–29)
Calcium: 9.5 mg/dL (ref 7.9–10.2)
Chloride: 108 mEq/L (ref 99–111)
Creatinine: 1.7 mg/dL — ABNORMAL HIGH (ref 0.5–1.5)
Glucose: 91 mg/dL (ref 70–100)
Sodium: 141 mEq/L (ref 135–145)
eGFR: 41.2 mL/min/{1.73_m2} — AB (ref >=60)

## 2022-05-26 ENCOUNTER — Telehealth (INDEPENDENT_AMBULATORY_CARE_PROVIDER_SITE_OTHER): Payer: Self-pay

## 2022-05-26 NOTE — Telephone Encounter (Addendum)
Patient was called and message was transmitted. Pt received the message and agreed to repeat Blood work within weeks.    ----- Message from Conception Oms, NP sent at 05/26/2022  3:02 PM EDT -----  Regarding: hemolyzed labs  Hi team- please let patient know recent BMP showed Cr back to his baseline however, unable to assess potassium level as it was hemolyzed. Would recommend he repeat it in the coming weeks. Thanks, Boneta Lucks      ----- Message -----  From: Interface, Lab In [place]  Sent: 05/25/2022   6:45 PM EDT  To: Conception Oms, NP

## 2022-06-04 ENCOUNTER — Other Ambulatory Visit (INDEPENDENT_AMBULATORY_CARE_PROVIDER_SITE_OTHER): Payer: Self-pay | Admitting: Nurse Practitioner

## 2022-06-04 DIAGNOSIS — I428 Other cardiomyopathies: Secondary | ICD-10-CM

## 2022-06-04 DIAGNOSIS — I5022 Chronic systolic (congestive) heart failure: Secondary | ICD-10-CM

## 2022-06-17 ENCOUNTER — Encounter (INDEPENDENT_AMBULATORY_CARE_PROVIDER_SITE_OTHER): Payer: Self-pay | Admitting: Family Medicine

## 2022-06-24 ENCOUNTER — Encounter (INDEPENDENT_AMBULATORY_CARE_PROVIDER_SITE_OTHER): Payer: Commercial Managed Care - POS | Admitting: Nurse Practitioner

## 2022-06-24 ENCOUNTER — Other Ambulatory Visit (INDEPENDENT_AMBULATORY_CARE_PROVIDER_SITE_OTHER): Payer: Self-pay | Admitting: Family Medicine

## 2022-06-24 NOTE — Progress Notes (Deleted)
[place] [facility] ADVANCED HEART FAILURE OFFICE PROGRESS NOTE    HRT [facility] OFFICE -CARDIOLOGY  [address]  [place] [address]  Dept: [phone]  Dept Fax: [phone]       Patient Name: [name] [name]    Date of Visit:  June 24, 2022  Date of Birth: [birth year]  AGE: 76 y.o.  Medical Record #: [id number]      CHIEF COMPLAINT:    No chief complaint on file.      HISTORY OF PRESENT ILLNESS:    I had the pleasure of seeing [name] today in follow up in the Advanced Heart Failure Clinic at [facility]. He is a pleasant 76 y.o. 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  For HF GDMT, he was previously on carvedilol 25 mg twice daily, Entresto 97/23 mg twice daily, Jardiance 10 mg daily, and spironolactone 12.5 mg daily. He has a Medtronic ICD in place.     Recently had a hospitalization back in May for worsening swelling, scrotal edema and shortness of breath.  He was given IV diuresis with improvement in breathing and lower extremity edema.  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 Per Dr. Laurine Blazer from [facility] there was discussion regarding treatment with BTK inhibitors or venetoclax. Seen by urology, who did not recommend surgery at this time.  He was also found to have right lower extremity DVT now on Eliquis for anticoagulation. During his hospital course, spironolactone was discontinued for hyperkalemia. He also briefly required pressors which felt to be related to hypovolemia.     He had a ED visit in July for angioedema and swelling in the lips after starting allopurinol and acalabrutinib. He then followed up with [facility] who felt reaction was likely related to allopurinol given prior studies reported.  At follow-up appointment on 05/07/2022 he was rechallenged with acalabrutinib. Has been tolerating it well without any  recurrent rash or swelling. Cr elevated at 3.1 during ED evaluation.     At follow-up today, reports stable symptoms overall. Blood pressures have been stable around 110s. He has not been on toprol XL.  States LE edema better.  Denies any worsening shortness of breath, orthopnea, PND, or edema.  Still has some lingering fatigue.     PAST MEDICAL HISTORY: He has a past medical history of Acute CHF, Acute systolic (congestive) heart failure (08/2017), Anxiety, CAD (coronary artery disease), Cardiomyopathy, CHF (congestive heart failure) (08/12/2014, 2013), Chronic obstructive pulmonary disease, Coronary artery disease (2011), Depression, echocardiogram (02/2016, 11/2016, 09/2017, 12/20/2017), GERD (gastroesophageal reflux disease), Heart attack (2011), Hyperlipemia, Hypertension, ICD (implantable 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, Ischemic cardiomyopathy, Nonischemic cardiomyopathy, NSTEMI (non-ST elevated myocardial infarction), 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 He has a past surgical history that includes duodenal ulcer (1973); orthopedic surgery (AGE 44); EGD (N/A, 08/15/2014); Circumcision (AGE 72); Fracture surgery (AGE 23); Hernia repair (AGE 72); Colonoscopy (2009); Tonsillectomy and adenoidectomy (1954); Cardiac defibrillator placement (12/2012); Cardiac pacemaker placement (2014); EGD (1975); EGD, COLONOSCOPY (N/A, 04/04/2018); ECHOCARDIOGRAM, TRANSTHORACIC (02/2016 11/2016 09/2017 12/2017); MPI nuclear study (06/2016); Correction hammer toe; BIV (11/10/2016); ECHOCARDIOGRAM, TRANSTHORACIC (02/2016,11/2016,12/20/2017); Cardiac catheterization (03/2010); and Cardiac catheterization (12/2012).    ALLERGIES: No Known Allergies    MEDICATIONS:   Current Outpatient Medications:     apixaban (ELIQUIS) 5 MG, Take  2 tablets (10 mg) by mouth every 12 (twelve) hours For 1  week followed by 1 tablet twice daily, Disp: 90 tablet, Rfl: 2    aspirin EC 81 MG EC tablet, Take 1 tablet (81 mg) by mouth daily, Disp: , Rfl:     atorvastatin (LIPITOR) 40 MG tablet, Take 1 tablet (40 mg total) by mouth nightly, Disp: 90 tablet, Rfl: 0    Calquence 100 MG Tab, , Disp: , Rfl:     Continuous Blood Gluc Sensor (FreeStyle Libre 3 Sensor) Misc, Use 1 Device every 14 (fourteen) days, Disp: 6 each, Rfl: 3    EPINEPHrine 0.3 MG/0.3ML Solution Auto-injector injection, Inject 0.3 mLs (0.3 mg) into the muscle as needed (for Anaphylaxis), Disp: 2 each, Rfl: 0    fluticasone-salmeterol (ADVAIR DISKUS) 100-50 MCG/ACT Aerosol Pwdr, Breath Activated, Inhale 1 puff into the lungs 2 (two) times daily, Disp: 3 each, Rfl: 3    glucose blood (ONE TOUCH ULTRA TEST) test strip, 1 each by Other route 2 (two) times daily Use as instructed, Disp: 300 each, Rfl: 1    insulin glargine (LANTUS SOLOSTAR) 100 UNIT/ML injection pen, Patient takes 50 units at night, Disp: 45 mL, Rfl: 3    Insulin Pen Needle (BD Pen Needle Nano U/F) 32G X 4 MM Misc, Use as directed bid, Disp: 200 each, Rfl: 3    Jardiance 10 MG tablet, TAKE 1 TABLET(10 MG) BY MOUTH DAILY, Disp: 90 tablet, Rfl: 3    loratadine (CLARITIN) 10 MG tablet, Take 1 tablet (10 mg) by mouth daily, Disp: 5 tablet, Rfl: 0    metoprolol succinate XL (TOPROL-XL) 25 MG 24 hr tablet, Take 0.5 tablets (12.5 mg) by mouth daily, Disp: 45 tablet, Rfl: 3    niacin (NIASPAN) 500 MG CR tablet, TAKE 1 TABLET BY MOUTH TWICE DAILY, Disp: 180 tablet, Rfl: 3    PARoxetine (Paxil) 20 MG tablet, Take 1 tablet (20 mg total) by mouth every morning (Patient not taking: Reported on 05/22/2022), Disp: 30 tablet, Rfl: 11    predniSONE (DELTASONE) 10 MG tablet, 4 tabs once a day for 2 days then 3 tabs once a day for 2 days then 2 tabs once a day for 2 days then 1 tab once a day for 2 days, Disp: 20 tablet, Rfl: 0    Semglee, yfgn, 100 UNIT/ML Solution Pen-injector, ADMINISTER 50 UNITS UNDER THE SKIN  AT BEDTIME, Disp: 45 mL, Rfl: 3    SITagliptin-metFORMIN (Janumet) 50-500 MG tablet, Take 1 tablet by mouth 2 (two) times daily with meals, Disp: 60 tablet, Rfl: 5    torsemide (DEMADEX) 20 MG tablet, TAKE 3 TABLETS BY MOUTH EVERY MORNING (Patient taking differently: 2 tablets in am 1 tablets in pm), Disp: 270 tablet, Rfl: 3    UNABLE TO FIND, Med Name: Modal (Patient not taking: Reported on 05/22/2022), Disp: , Rfl:     valsartan (DIOVAN) 40 MG tablet, Take 0.5 tablets (20 mg) by mouth daily, Disp: 90 tablet, Rfl: 3     FAMILY HISTORY: family history includes Breast cancer in his mother; Coronary artery disease in his mother; Diabetes in his brother and mother; Heart attack in his father; Heart attack (age of onset: 35) in his mother; Hypertension in his father and mother.    SOCIAL HISTORY: He reports that he has never smoked. He has never used smokeless tobacco. He reports that he does not currently use alcohol. He reports that he does not use drugs.      PHYSICAL  EXAMINATION    There were no vitals taken for this visit.        Last 3 Weights:       04/16/2022     1:20 PM 04/18/2022     4:26 PM 05/22/2022     2:16 PM   Weight Monitoring   Height  182.9 cm 182.9 cm   Weight 96.435 kg 93.441 kg 92.08 kg   BMI (calculated)  28 kg/m2 27.6 kg/m2         General Appearance:  Cooperative and in no acute distress.    Skin: Warm and dry to touch  Head: Normocephalic  Eyes: Conjunctivae and lids unremarkable.    Neck: No JVD  Chest: Clear to auscultation bilaterally with good respiratory effort and no wheezes, rales, or rhonchi   Cardiovascular: Regular rhythm, S1 normal, S2 normal, No S3 or S4  Abdomen: Soft, nontender with normoactive bowel sounds.   Extremities: Warm/dry without edema, +2 b/l radial pulses  Neuro: Alert and oriented x3. No gross motor or sensory deficits noted, affect appropriate.      IMPRESSION:   Mr. Gutridge is a 76 y.o. male with the following problems:    Recent admission for acute on chronic systolic  CHF and scrotal edema  EF 25 to 30% by echo August 2022.  Moderate tricuspid and mitral regurgitation present.  PA pressures were 54 mmHg.   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 Drucie Opitz cancer specialist as scheduled  Repeat BMP to repeat renal function  Follow-up with [place] in 4 weeks, or sooner if needed                                                  No orders of the defined types were placed in this encounter.      No orders of the defined types were placed in this encounter.        SIGNED:    Conception Oms, NP          This note was generated by the Dragon speech recognition and may contain errors or omissions not intended by the user. Grammatical errors, random word insertions, deletions, pronoun errors, and incomplete sentences are occasional consequences of this technology due to software limitations. Not all errors are caught or corrected. If there are questions or concerns about the content of this note or information contained within the body of this dictation, they should be addressed directly with the author for clarification.

## 2022-07-03 ENCOUNTER — Encounter (INDEPENDENT_AMBULATORY_CARE_PROVIDER_SITE_OTHER): Payer: Self-pay | Admitting: Nurse Practitioner

## 2022-07-03 ENCOUNTER — Ambulatory Visit (INDEPENDENT_AMBULATORY_CARE_PROVIDER_SITE_OTHER): Payer: Commercial Managed Care - POS | Admitting: Nurse Practitioner

## 2022-07-03 VITALS — BP 116/82 | HR 82 | Ht 72.0 in | Wt 207.0 lb

## 2022-07-03 DIAGNOSIS — I5023 Acute on chronic systolic (congestive) heart failure: Secondary | ICD-10-CM

## 2022-07-03 DIAGNOSIS — I428 Other cardiomyopathies: Secondary | ICD-10-CM

## 2022-07-03 NOTE — Progress Notes (Signed)
[place] HEART ADVANCED HEART FAILURE OFFICE PROGRESS NOTE    HRT [facility] OFFICE -CARDIOLOGY  [address]  [place] [address]  Dept: [phone]  Dept Fax: [phone]       Patient Name: [name] [name]    Date of Visit:  July 03, 2022  Date of Birth: December 08, 1945  AGE: 76 y.o.  Medical Record #: [id number]      CHIEF COMPLAINT:    Chief Complaint   Patient presents with    Cardiomyopathy    Congestive Heart Failure    Follow-up    Shortness of Breath       HISTORY OF PRESENT ILLNESS:    I had the pleasure of seeing [name] today in follow up in the Advanced Heart Failure Clinic at [facility]. He is a pleasant 76 y.o. 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  For HF GDMT, he was previously on carvedilol 25 mg twice daily, Entresto 97/23 mg twice daily, Jardiance 10 mg daily, and spironolactone 12.5 mg daily. He has a Medtronic ICD in place.     He had a recent hospitalization at [facility] 03/09/2022 for worsening swelling, scrotal edema and shortness of breath. Chest x-ray showed no acute cardiopulmonary process.  He had an ultrasound of the scrotum and testicles, showed moderate size, left high steel scrotal wall edema. 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 He was also found to have right lower extremity DVT now on Eliquis for anticoagulation.     For the past several days, he has noticed increased weight, cough, and congestion in his chest.  He had difficulty laying down flat in bed.  He took an extra dose of torsemide 20 mg yesterday with improvement in his weight and breathing.  However, he is still complaining of shortness of breath and cough.  His JVD is elevated on exam today.  Denies any chest pain/discomfort or lower extremity edema.  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      Blood work in  August showed BUN 34, creatinine 1.7, and sodium 141.  Potassium was hemolysed.    PAST MEDICAL HISTORY: He has a past medical history of Acute CHF, Acute systolic (congestive) heart failure (08/2017), Anxiety, CAD (coronary artery disease), Cardiomyopathy, CHF (congestive heart failure) (08/12/2014, 2013), Chronic obstructive pulmonary disease, Coronary artery disease (2011), Depression, echocardiogram (02/2016, 11/2016, 09/2017, 12/20/2017), GERD (gastroesophageal reflux disease), Heart attack (2011), Hyperlipemia, Hypertension, ICD (implantable 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, Ischemic cardiomyopathy, Nonischemic cardiomyopathy, NSTEMI (non-ST elevated myocardial infarction), 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 He has a past surgical history that includes duodenal ulcer (1973); orthopedic surgery (AGE 51); EGD (N/A, 08/15/2014); Circumcision (AGE 89); Fracture surgery (AGE 26); Hernia repair (AGE 89); Colonoscopy (2009); Tonsillectomy and adenoidectomy (1954); Cardiac defibrillator placement (12/2012); Cardiac pacemaker placement (2014); EGD (1975); EGD, COLONOSCOPY (N/A, 04/04/2018); ECHOCARDIOGRAM, TRANSTHORACIC (02/2016 11/2016 09/2017 12/2017); MPI nuclear study (06/2016); Correction hammer toe; BIV (11/10/2016); ECHOCARDIOGRAM, TRANSTHORACIC (02/2016,11/2016,12/20/2017); Cardiac catheterization (03/2010); and Cardiac catheterization (12/2012).    ALLERGIES: No Known Allergies    MEDICATIONS:   Current Outpatient Medications:     apixaban (ELIQUIS) 5 MG, Take 2 tablets (10 mg) by mouth every 12 (twelve) hours For 1 week followed by 1 tablet twice daily, Disp: 90 tablet,  Rfl: 2    aspirin EC 81 MG EC tablet, Take 1 tablet (81 mg) by mouth daily, Disp: , Rfl:     atorvastatin (LIPITOR) 40 MG tablet, Take 1 tablet (40 mg total) by mouth nightly, Disp: 90 tablet,  Rfl: 0    Calquence 100 MG Tab, , Disp: , Rfl:     Continuous Blood Gluc Sensor (FreeStyle Libre 3 Sensor) Misc, Use 1 Device every 14 (fourteen) days, Disp: 6 each, Rfl: 3    EPINEPHrine 0.3 MG/0.3ML Solution Auto-injector injection, Inject 0.3 mLs (0.3 mg) into the muscle as needed (for Anaphylaxis), Disp: 2 each, Rfl: 0    fluticasone-salmeterol (ADVAIR DISKUS) 100-50 MCG/ACT Aerosol Pwdr, Breath Activated, Inhale 1 puff into the lungs 2 (two) times daily, Disp: 3 each, Rfl: 3    glucose blood (ONE TOUCH ULTRA TEST) test strip, 1 each by Other route 2 (two) times daily Use as instructed, Disp: 300 each, Rfl: 1    insulin glargine (LANTUS SOLOSTAR) 100 UNIT/ML injection pen, Patient takes 50 units at night, Disp: 45 mL, Rfl: 3    Insulin Pen Needle (BD Pen Needle Nano U/F) 32G X 4 MM Misc, Use as directed bid, Disp: 200 each, Rfl: 3    Jardiance 10 MG tablet, TAKE 1 TABLET(10 MG) BY MOUTH DAILY, Disp: 90 tablet, Rfl: 3    loratadine (CLARITIN) 10 MG tablet, Take 1 tablet (10 mg) by mouth daily, Disp: 5 tablet, Rfl: 0    metoprolol succinate XL (TOPROL-XL) 25 MG 24 hr tablet, Take 0.5 tablets (12.5 mg) by mouth daily, Disp: 45 tablet, Rfl: 3    niacin (NIASPAN) 500 MG CR tablet, TAKE 1 TABLET BY MOUTH TWICE DAILY, Disp: 180 tablet, Rfl: 3    predniSONE (DELTASONE) 10 MG tablet, 4 tabs once a day for 2 days then 3 tabs once a day for 2 days then 2 tabs once a day for 2 days then 1 tab once a day for 2 days, Disp: 20 tablet, Rfl: 0    Semglee, yfgn, 100 UNIT/ML Solution Pen-injector, ADMINISTER 50 UNITS UNDER THE SKIN AT BEDTIME, Disp: 45 mL, Rfl: 3    SITagliptin-metFORMIN (Janumet) 50-500 MG tablet, Take 1 tablet by mouth 2 (two) times daily with meals, Disp: 60 tablet, Rfl: 5    torsemide (DEMADEX) 20 MG tablet, TAKE 3 TABLETS BY MOUTH EVERY MORNING (Patient taking differently: 2 tablets in am 1 tablets in pm), Disp: 270 tablet, Rfl: 3    valsartan (DIOVAN) 40 MG tablet, Take 0.5 tablets (20 mg) by mouth daily,  Disp: 90 tablet, Rfl: 3    PARoxetine (Paxil) 20 MG tablet, Take 1 tablet (20 mg total) by mouth every morning (Patient not taking: Reported on 05/22/2022), Disp: 30 tablet, Rfl: 11     FAMILY HISTORY: family history includes Breast cancer in his mother; Coronary artery disease in his mother; Diabetes in his brother and mother; Heart attack in his father; Heart attack (age of onset: 89) in his mother; Hypertension in his father and mother.    SOCIAL HISTORY: He reports that he has never smoked. He has never used smokeless tobacco. He reports that he does not currently use alcohol. He reports that he does not use drugs.      PHYSICAL EXAMINATION    Visit Vitals  BP 116/82 (BP Site: Right arm, Patient Position: Sitting, Cuff Size: Medium)   Pulse 82   Ht 1.829 m (6')   Wt 93.9 kg (207 lb)   BMI 28.07 kg/m  Last 3 Weights:       04/18/2022     4:26 PM 05/22/2022     2:16 PM 07/03/2022    10:51 AM   Weight Monitoring   Height 182.9 cm 182.9 cm 182.9 cm   Weight 93.441 kg 92.08 kg 93.895 kg   BMI (calculated) 28 kg/m2 27.6 kg/m2 28.1 kg/m2         General Appearance:  Cooperative and in no acute distress.    Skin: Warm and dry to touch  Head: Normocephalic  Eyes: Conjunctivae and lids unremarkable.    Neck: + JVD  Chest: Clear/diminished to auscultation bilaterally, DOE   Cardiovascular: Regular rhythm, S1 normal, S2 normal, No S3 or S4  Abdomen: Soft, nontender with normoactive bowel sounds.   Extremities: Warm/dry without edema, +2 b/l radial pulses  Neuro: Alert and oriented x3. No gross motor or sensory deficits noted, affect appropriate.      IMPRESSION:   Mr. Bynum is a 76 y.o. male with the following problems:    Acute on chronic systolic CHF. Congestion and cough today   EF 25 to 30% by echo August 2022.  Moderate tricuspid and mitral regurgitation present.  PA pressures were 54 mmHg.   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  If no improvement in shortness of breath he understands to go to the ER for further evaluation.  Will arrange RN call on Monday to reassess symptoms  Repeat BMP to repeat renal function  Close follow-up with [place] in 2 weeks, or sooner if needed                                                  Orders Placed This Encounter   Procedures    Basic Metabolic Panel    Advanced Heart Failure APP Visit (HRT [place])       No orders of the defined types were placed in this encounter.        SIGNED:    Conception Oms, NP          This note was generated by the Dragon speech recognition and may contain errors or omissions not intended by the user. Grammatical errors, random word insertions, deletions, pronoun errors, and incomplete sentences are occasional consequences of this technology due to software limitations. Not all errors are caught or corrected. If there are questions or concerns about the content of this note or information contained within the body of this dictation, they should be addressed directly with the author for clarification.

## 2022-07-06 ENCOUNTER — Telehealth (INDEPENDENT_AMBULATORY_CARE_PROVIDER_SITE_OTHER): Payer: Self-pay

## 2022-07-06 NOTE — Telephone Encounter (Signed)
Attempted to call patient for symptom check after med adjust per Conception Oms, NP at recent office visit. Both his and wife's mobile numbers went straight to VM. Will try again to follow up

## 2022-07-07 ENCOUNTER — Encounter (INDEPENDENT_AMBULATORY_CARE_PROVIDER_SITE_OTHER): Payer: Self-pay | Admitting: Family Medicine

## 2022-07-09 ENCOUNTER — Other Ambulatory Visit (FREE_STANDING_LABORATORY_FACILITY): Payer: Commercial Managed Care - POS

## 2022-07-09 DIAGNOSIS — I428 Other cardiomyopathies: Secondary | ICD-10-CM

## 2022-07-09 DIAGNOSIS — I5023 Acute on chronic systolic (congestive) heart failure: Secondary | ICD-10-CM

## 2022-07-09 LAB — BASIC METABOLIC PANEL
Anion Gap: 12 (ref 5.0–15.0)
BUN: 34 mg/dL — ABNORMAL HIGH (ref 9.0–28.0)
CO2: 23 mEq/L (ref 17–29)
Calcium: 9.3 mg/dL (ref 7.9–10.2)
Chloride: 106 mEq/L (ref 99–111)
Creatinine: 2 mg/dL — ABNORMAL HIGH (ref 0.5–1.5)
Glucose: 157 mg/dL — ABNORMAL HIGH (ref 70–100)
Potassium: 4.1 mEq/L (ref 3.5–5.3)
Sodium: 141 mEq/L (ref 135–145)
eGFR: 34 mL/min/{1.73_m2} — AB (ref >=60)

## 2022-07-09 LAB — HEMOLYSIS INDEX: Hemolysis Index: 23 Index (ref 0–24)

## 2022-07-13 ENCOUNTER — Other Ambulatory Visit: Payer: Commercial Managed Care - POS

## 2022-07-13 DIAGNOSIS — Z95810 Presence of automatic (implantable) cardiac defibrillator: Secondary | ICD-10-CM

## 2022-07-16 ENCOUNTER — Telehealth (INDEPENDENT_AMBULATORY_CARE_PROVIDER_SITE_OTHER): Payer: Self-pay

## 2022-07-16 ENCOUNTER — Ambulatory Visit (INDEPENDENT_AMBULATORY_CARE_PROVIDER_SITE_OTHER): Payer: Commercial Managed Care - POS | Admitting: Cardiovascular Disease

## 2022-07-16 NOTE — Telephone Encounter (Signed)
Called patient to follow up after missed appt with Dr Webb Silversmith today. He had another appt at the same time and forgot to let our office know.    He reports ongoing SOB and LE edema not much improved from when he saw Conception Oms, NP on 9/15-- taking Torsemide 40mg  in AM and 20mg  in PM-- recommendation was to increase to 40BID but he has not yet increased to this amount. He did have BMP 9/21-- Cr is 2.0 up from 1.7 on 05/25/22.     Reviewed with Dr Webb Silversmith and Conception Oms, NP-- BMP is stable, please increase to Torsemide 40mg  BID and reschedule appt to 10/6 with Dr Webb Silversmith. Confirmed appt time, and patient agreeable to increase torsemide dose. He has no additional questions at this time. Gave AHF direct phone line for any changes in sx before next week appt.

## 2022-07-24 ENCOUNTER — Ambulatory Visit
Admission: RE | Admit: 2022-07-24 | Discharge: 2022-07-24 | Disposition: A | Discharge status: Home / Self Care | Payer: Commercial Managed Care - POS | Source: Ambulatory Visit | Attending: Cardiovascular Disease | Admitting: Cardiovascular Disease

## 2022-07-24 ENCOUNTER — Encounter (INDEPENDENT_AMBULATORY_CARE_PROVIDER_SITE_OTHER): Payer: Self-pay | Admitting: Cardiovascular Disease

## 2022-07-24 ENCOUNTER — Ambulatory Visit (INDEPENDENT_AMBULATORY_CARE_PROVIDER_SITE_OTHER): Payer: Commercial Managed Care - POS | Admitting: Cardiovascular Disease

## 2022-07-24 DIAGNOSIS — I428 Other cardiomyopathies: Secondary | ICD-10-CM

## 2022-07-24 DIAGNOSIS — I5023 Acute on chronic systolic (congestive) heart failure: Secondary | ICD-10-CM | POA: Insufficient documentation

## 2022-07-24 LAB — BASIC METABOLIC PANEL
Anion Gap: 12 (ref 5.0–15.0)
BUN: 32 mg/dL — ABNORMAL HIGH (ref 9.0–28.0)
CO2: 21 mEq/L (ref 17–29)
Calcium: 9 mg/dL (ref 7.9–10.2)
Chloride: 108 mEq/L (ref 99–111)
Creatinine: 1.9 mg/dL — ABNORMAL HIGH (ref 0.5–1.5)
Glucose: 143 mg/dL — ABNORMAL HIGH (ref 70–100)
Potassium: 4.5 mEq/L (ref 3.5–5.3)
Sodium: 141 mEq/L (ref 135–145)
eGFR: 36.2 mL/min/{1.73_m2} — AB (ref >=60)

## 2022-07-24 LAB — HEMOLYSIS INDEX: Hemolysis Index: 7 Index (ref 0–24)

## 2022-07-24 LAB — MAGNESIUM: Magnesium: 2.2 mg/dL (ref 1.6–2.6)

## 2022-07-24 MED ORDER — TORSEMIDE 20 MG PO TABS
60.0000 mg | ORAL_TABLET | Freq: Two times a day (BID) | ORAL | 3 refills | Status: DC
Start: 2022-07-24 — End: 2022-09-14

## 2022-07-24 NOTE — Progress Notes (Signed)
[place] HEART ADVANCED HEART FAILURE OFFICE PROGRESS NOTE    HRT [facility] OFFICE -CARDIOLOGY  [address]  [place] [address]  Dept: [phone]  Dept Fax: [phone]       Patient Name: [name] [name]    Date of Visit:  July 24, 2022  Date of Birth: 04/26/1946  AGE: 76 y.o.  Medical Record #: [id number]      CHIEF COMPLAINT:    Chief Complaint   Patient presents with    Follow-up    Acute on chronic systolic congestive heart failure       HISTORY OF PRESENT ILLNESS:    I had the pleasure of seeing [name] today in follow up in the Advanced Heart Failure Clinic at [facility]. He is a pleasant 76 y.o. 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  For HF GDMT, he was previously on carvedilol 25 mg twice daily, Entresto 97/23 mg twice daily, Jardiance 10 mg daily, and spironolactone 12.5 mg daily. He has a Medtronic ICD in place.     Last week we increase his torsemide from 40 mg in the morning and 20 mg in the afternoon through the higher dose of 40 mg twice daily.  He reports that his shortness of breath is better however he continues to have orthopnea and a left-sided chest pressure when he lays flat.  He is now sleeping on the couch.    From our previous clinic note:  He was initially seen by urology couple weeks prior for severe scrotal edema.  He also mentioned that he had lower extremity edema as well.  Eventually was admitted to [facility] 03/09/2022 for worsening swelling and shortness of breath. Chest x-ray showed no acute cardiopulmonary process.  He had an ultrasound of the scrotum and testicles, showed moderate size, left high steel scrotal wall edema.  After IV diuresis, he noticed improvement in his breathing and LE edema. 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 Per Dr. Laurine Blazer from [facility] there was discussion regarding  treatment with BTK inhibitors or venetoclax. Seen by urology, who did not recommend surgery at this time.  He was also found to have right lower extremity DVT now on Eliquis for anticoagulation.   During his hospital course, spironolactone was discontinued for hyperkalemia. He also briefly required pressors which felt to be related to hypovolemia. He was restarted on low dose Valsartan prior to discharge.   At follow-up today, reports stable symptoms since discharge.  Average blood pressures are now in the systolic 110s to 244W.  Denies any worsening shortness of breath, orthopnea, PND, or edema.  On his home scale, he is weighing around 190 pounds.  He saw Dr. Laurine Blazer and was started on a new medication (name unknown).  He has been taking this medication for 1 week.    PAST MEDICAL HISTORY: He has a past medical history of Acute CHF, Acute systolic (congestive) heart failure (08/2017), Anxiety, CAD (coronary artery disease), Cardiomyopathy, CHF (congestive heart failure) (08/12/2014, 2013), Chronic obstructive pulmonary disease, Coronary artery disease (2011), Depression, echocardiogram (02/2016, 11/2016, 09/2017, 12/20/2017), GERD (gastroesophageal reflux disease), Heart attack (2011), Hyperlipemia, Hypertension, ICD (implantable 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, Ischemic cardiomyopathy, Nonischemic cardiomyopathy, NSTEMI (non-ST elevated myocardial infarction), 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 He has a past surgical  history that includes duodenal ulcer (1973); orthopedic surgery (AGE 61); EGD (N/A, 08/15/2014); Circumcision (AGE 60); Fracture surgery (AGE 89); Hernia repair (AGE 60); Colonoscopy (2009); Tonsillectomy and adenoidectomy (1954); Cardiac defibrillator placement (12/2012); Cardiac pacemaker placement (2014); EGD (1975); EGD, COLONOSCOPY (N/A, 04/04/2018);  ECHOCARDIOGRAM, TRANSTHORACIC (02/2016 11/2016 09/2017 12/2017); MPI nuclear study (06/2016); Correction hammer toe; BIV (11/10/2016); ECHOCARDIOGRAM, TRANSTHORACIC (02/2016,11/2016,12/20/2017); Cardiac catheterization (03/2010); and Cardiac catheterization (12/2012).    ALLERGIES:   Allergies   Allergen Reactions    Allopurinol        MEDICATIONS:   Current Outpatient Medications:     apixaban (ELIQUIS) 5 MG, Take 2 tablets (10 mg) by mouth every 12 (twelve) hours For 1 week followed by 1 tablet twice daily, Disp: 90 tablet, Rfl: 2    aspirin EC 81 MG EC tablet, Take 1 tablet (81 mg) by mouth daily, Disp: , Rfl:     atorvastatin (LIPITOR) 40 MG tablet, Take 1 tablet (40 mg total) by mouth nightly, Disp: 90 tablet, Rfl: 0    Calquence 100 MG Tab, , Disp: , Rfl:     Continuous Blood Gluc Sensor (FreeStyle Libre 3 Sensor) Misc, Use 1 Device every 14 (fourteen) days, Disp: 6 each, Rfl: 3    EPINEPHrine 0.3 MG/0.3ML Solution Auto-injector injection, Inject 0.3 mLs (0.3 mg) into the muscle as needed (for Anaphylaxis), Disp: 2 each, Rfl: 0    glucose blood (ONE TOUCH ULTRA TEST) test strip, 1 each by Other route 2 (two) times daily Use as instructed, Disp: 300 each, Rfl: 1    insulin glargine (LANTUS SOLOSTAR) 100 UNIT/ML injection pen, Patient takes 50 units at night, Disp: 45 mL, Rfl: 3    Insulin Pen Needle (BD Pen Needle Nano U/F) 32G X 4 MM Misc, Use as directed bid, Disp: 200 each, Rfl: 3    Jardiance 10 MG tablet, TAKE 1 TABLET(10 MG) BY MOUTH DAILY, Disp: 90 tablet, Rfl: 3    loratadine (CLARITIN) 10 MG tablet, Take 1 tablet (10 mg) by mouth daily, Disp: 5 tablet, Rfl: 0    metOLazone (ZAROXOLYN) 10 MG tablet, Take 1 tablet (10 mg) by mouth daily, Disp: , Rfl:     metoprolol succinate XL (TOPROL-XL) 25 MG 24 hr tablet, Take 0.5 tablets (12.5 mg) by mouth daily, Disp: 45 tablet, Rfl: 3    niacin (NIASPAN) 500 MG CR tablet, TAKE 1 TABLET BY MOUTH TWICE DAILY, Disp: 180 tablet, Rfl: 3    Semglee, yfgn, 100 UNIT/ML  Solution Pen-injector, ADMINISTER 50 UNITS UNDER THE SKIN AT BEDTIME, Disp: 45 mL, Rfl: 3    SITagliptin-metFORMIN (Janumet) 50-500 MG tablet, Take 1 tablet by mouth 2 (two) times daily with meals, Disp: 60 tablet, Rfl: 5    torsemide (DEMADEX) 20 MG tablet, TAKE 3 TABLETS BY MOUTH EVERY MORNING (Patient taking differently: 2 tablets in am 2 tablets in pm), Disp: 270 tablet, Rfl: 3    valsartan (DIOVAN) 40 MG tablet, Take 0.5 tablets (20 mg) by mouth daily, Disp: 90 tablet, Rfl: 3    fluticasone-salmeterol (ADVAIR DISKUS) 100-50 MCG/ACT Aerosol Pwdr, Breath Activated, Inhale 1 puff into the lungs 2 (two) times daily (Patient not taking: Reported on 07/24/2022), Disp: 3 each, Rfl: 3     FAMILY HISTORY: family history includes Breast cancer in his mother; Coronary artery disease in his mother; Diabetes in his brother and mother; Heart attack in his father; Heart attack (age of onset: 54) in his mother; Hypertension in his father and mother.    SOCIAL HISTORY:  He reports that he has never smoked. He has never used smokeless tobacco. He reports that he does not currently use alcohol. He reports that he does not use drugs.      PHYSICAL EXAMINATION    Visit Vitals  Blood Pressure 102/80 (BP Site: Left arm, Patient Position: Sitting, Cuff Size: Medium)   Pulse 85   Height 1.829 m (6')   Weight 91.6 kg (202 lb)   Oxygen Saturation 98%   Body Mass Index 27.40 kg/m         Last 3 Weights:      Row Labels 05/22/2022     2:16 PM 07/03/2022    10:51 AM 07/24/2022    10:23 AM   Weight Monitoring   Section Header. No data exists in this row.      Height   182.9 cm 182.9 cm 182.9 cm   Weight   92.08 kg 93.895 kg 91.627 kg   BMI (calculated)   27.6 kg/m2 28.1 kg/m2 27.5 kg/m2         General Appearance:  Cooperative and in no acute distress.    Skin: Warm and dry to touch  Head: Normocephalic  Eyes: Conjunctivae and lids unremarkable.    Neck: No JVD  Chest: Clear to auscultation bilaterally with good respiratory effort and no wheezes,  rales, or rhonchi   Cardiovascular: Regular rhythm, S1 normal, S2 normal, No S3 or S4  Abdomen: Soft, nontender with normoactive bowel sounds.   Extremities: Warm/dry without edema, +2 b/l radial pulses  Neuro: Alert and oriented x3. No gross motor or sensory deficits noted, affect appropriate.      IMPRESSION:   [name] [name] is a 76 y.o. male with the following problems:    Recent admission for acute on chronic systolic CHF and scrotal edema  EF 25 to 30% by echo August 2022.  Moderate tricuspid and mitral regurgitation present.  PA pressures were 54 mmHg.   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                                                   No orders of the defined types were placed in this encounter.      No orders of the defined types were placed in this encounter.        SIGNED:    Marianna Fuss, MD          This note was generated by the Dragon speech recognition and may contain errors or omissions not intended by the user. Grammatical errors, random word insertions, deletions, pronoun errors, and incomplete sentences are occasional consequences of this technology due to software limitations. Not all errors are caught  or corrected. If there are questions or concerns about the content of this note or information contained within the body of this dictation, they should be addressed directly with the author for clarification.

## 2022-07-28 ENCOUNTER — Encounter (INDEPENDENT_AMBULATORY_CARE_PROVIDER_SITE_OTHER): Payer: Self-pay

## 2022-07-31 ENCOUNTER — Encounter (INDEPENDENT_AMBULATORY_CARE_PROVIDER_SITE_OTHER): Payer: Self-pay | Admitting: Family Medicine

## 2022-08-03 ENCOUNTER — Other Ambulatory Visit (INDEPENDENT_AMBULATORY_CARE_PROVIDER_SITE_OTHER): Payer: Commercial Managed Care - POS | Admitting: Cardiovascular Disease

## 2022-08-03 ENCOUNTER — Ambulatory Visit (INDEPENDENT_AMBULATORY_CARE_PROVIDER_SITE_OTHER): Payer: Commercial Managed Care - POS | Admitting: Family Medicine

## 2022-08-04 ENCOUNTER — Encounter (INDEPENDENT_AMBULATORY_CARE_PROVIDER_SITE_OTHER): Payer: Self-pay | Admitting: Family Medicine

## 2022-08-04 ENCOUNTER — Ambulatory Visit (INDEPENDENT_AMBULATORY_CARE_PROVIDER_SITE_OTHER): Payer: Commercial Managed Care - POS | Admitting: Family Medicine

## 2022-08-04 VITALS — BP 104/68 | HR 85 | Temp 98.0°F | Resp 16 | Wt 207.8 lb

## 2022-08-04 DIAGNOSIS — Z794 Long term (current) use of insulin: Secondary | ICD-10-CM

## 2022-08-04 DIAGNOSIS — E1122 Type 2 diabetes mellitus with diabetic chronic kidney disease: Secondary | ICD-10-CM

## 2022-08-04 DIAGNOSIS — N183 Chronic kidney disease, stage 3 unspecified: Secondary | ICD-10-CM

## 2022-08-04 DIAGNOSIS — E118 Type 2 diabetes mellitus with unspecified complications: Secondary | ICD-10-CM

## 2022-08-04 DIAGNOSIS — Z23 Encounter for immunization: Secondary | ICD-10-CM

## 2022-08-04 DIAGNOSIS — I82491 Acute embolism and thrombosis of other specified deep vein of right lower extremity: Secondary | ICD-10-CM | POA: Insufficient documentation

## 2022-08-04 DIAGNOSIS — E7800 Pure hypercholesterolemia, unspecified: Secondary | ICD-10-CM

## 2022-08-04 DIAGNOSIS — I10 Essential (primary) hypertension: Secondary | ICD-10-CM

## 2022-08-04 NOTE — Progress Notes (Unsigned)
Subjective:      Date: 08/04/2022 5:09 PM   Patient ID: [name] [name] is a 76 y.o. male.    Chief Complaint:  Chief Complaint   Patient presents with    Discuss results     2 months ago Cardiologist asked to consult with PCP.       HPI:  HPI    HTN: Hypertension is well controlled at this point.  Patient is checking his blood pressure regularly at least 3-4 times a week.  Blood pressure at home is always in normal range.  Patient avoids adding salt and is doing regular cardiac exercise.  Average 120/80    Hyperlipidemia : Patient is compliant with current treatment for hyperlipidemia.  Is watching type of food and amount of calorie intake.  Tries to get with the weight into a BMI range of 25, is involved in regular exercising with cardiac workouts.      DM : Patient is compliant with current medication.  Tries to exercise at least 3 times a week.  Watches type of diet and amount of carbohydrate intake.  Check lab work for diabetes regularly in our clinic.    Patient will follow-up with cardiology after history of congestive heart failure next week.    The 1 fact I am most concerned about is his kidney failure.  It looks like his kidney function is getting worse.  I will refer him to Dr. Renaee Munda who is nephrologist and I also will recheck his kidney function specifically the GFR and the creatinine.    Problem List:  Patient Active Problem List   Diagnosis    Type 2 diabetes mellitus with complication, with long-term current use of insulin    Heart attack    CAD (coronary artery disease)    Non-ischemic cardiomyopathy    Acute exacerbation of CHF (congestive heart failure)    PUD (peptic ulcer disease)    Acute dyspnea    CHF (congestive heart failure)    Headaches due to old head injury    Essential hypertension    Syncope and collapse    AICD (automatic cardioverter/defibrillator) present    Cardiomyopathy    SOB (shortness of breath)    GERD (gastroesophageal reflux disease)    Hypertensive heart disease  without heart failure    Heart failure, unspecified    Nonischemic cardiomyopathy    Pure hypercholesterolemia    Chronic systolic congestive heart failure    Encounter for implantable defibrillator reprogramming or check    Episode of recurrent major depressive disorder, unspecified depression episode severity    Stage 3 chronic kidney disease, 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    Anxiety and depression    COPD (chronic obstructive pulmonary disease)    HLD (hyperlipidemia)    Sleep apnea, obstructive    Scrotal edema    Hydrocele, unspecified hydrocele type    Acute on chronic congestive heart failure, unspecified heart failure type    Deep vein thrombosis (DVT) of other vein of right lower extremity, unspecified chronicity       Current Medications:  Outpatient Medications Marked as Taking for the 08/04/22 encounter (Office Visit) with Zorita Pang, MD   Medication Sig Dispense Refill    apixaban (ELIQUIS) 5 MG Take 2 tablets (10 mg) by mouth every 12 (twelve) hours For 1 week followed by 1 tablet twice daily 90 tablet 2    aspirin EC 81 MG EC tablet Take 1 tablet (81 mg) by mouth daily  atorvastatin (LIPITOR) 40 MG tablet Take 1 tablet (40 mg total) by mouth nightly 90 tablet 0    Calquence 100 MG Tab       Continuous Blood Gluc Sensor (FreeStyle Libre 3 Sensor) Misc Use 1 Device every 14 (fourteen) days 6 each 3    EPINEPHrine 0.3 MG/0.3ML Solution Auto-injector injection Inject 0.3 mLs (0.3 mg) into the muscle as needed (for Anaphylaxis) 2 each 0    fluticasone-salmeterol (ADVAIR DISKUS) 100-50 MCG/ACT Aerosol Pwdr, Breath Activated Inhale 1 puff into the lungs 2 (two) times daily 3 each 3    glucose blood (ONE TOUCH ULTRA TEST) test strip 1 each by Other route 2 (two) times daily Use as instructed 300 each 1    insulin glargine (LANTUS SOLOSTAR) 100 UNIT/ML injection pen Patient takes 50 units at night 45 mL 3    Insulin Pen Needle (BD Pen Needle Nano U/F) 32G X 4 MM Misc Use as directed bid 200  each 3    Jardiance 10 MG tablet TAKE 1 TABLET(10 MG) BY MOUTH DAILY 90 tablet 3    loratadine (CLARITIN) 10 MG tablet Take 1 tablet (10 mg) by mouth daily 5 tablet 0    metOLazone (ZAROXOLYN) 10 MG tablet Take 1 tablet (10 mg) by mouth daily      metoprolol succinate XL (TOPROL-XL) 25 MG 24 hr tablet Take 0.5 tablets (12.5 mg) by mouth daily 45 tablet 3    niacin (NIASPAN) 500 MG CR tablet TAKE 1 TABLET BY MOUTH TWICE DAILY 180 tablet 3    Semglee, yfgn, 100 UNIT/ML Solution Pen-injector ADMINISTER 50 UNITS UNDER THE SKIN AT BEDTIME 45 mL 3    SITagliptin-metFORMIN (Janumet) 50-500 MG tablet Take 1 tablet by mouth 2 (two) times daily with meals 60 tablet 5    torsemide (DEMADEX) 20 MG tablet Take 3 tablets (60 mg) by mouth 2 (two) times daily 540 tablet 3    valsartan (DIOVAN) 40 MG tablet Take 0.5 tablets (20 mg) by mouth daily 90 tablet 3       Allergies:  Allergies   Allergen Reactions    Allopurinol        Past Medical History:  Past Medical History:   Diagnosis Date    Acute CHF     NOV  & DEC 2018, - DIALYSIS CATH PLACED Aug 24 2017 TO REMOVE FLUID     Acute systolic (congestive) heart failure 08/2017    Anxiety     CAD (coronary artery disease)     Cardiomyopathy     nonischemic    CHF (congestive heart failure) 08/12/2014, 2013    Chronic obstructive pulmonary disease     POSSIBLE PER PT HE USES  SYNBICORT BID ABD  BREO INHALER PRN    Coronary artery disease 2011    CORONARY STENT PLACEMENT FOLLOWED BY [facility]    Depression     echocardiogram 02/2016, 11/2016, 09/2017, 12/20/2017    GERD (gastroesophageal reflux disease)     Heart attack 2011    and 2014    Hyperlipemia     Hypertension     ICD (implantable 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     PLACED 2014  [facility]  LAST INTERROGATION April 01 2018 REPORT REQUESTED    Ischemic cardiomyopathy     EF 15% ON ECHO 09-20-2017 DR GARG IN [facility]    Nonischemic cardiomyopathy     NSTEMI (non-ST elevated myocardial infarction)     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    Pacemaker  2014  MEDTRONIC ICD/PACEMAKER COMBO LAST INTERROGATION  12-12 2018    Pneumonia 09/2016    Primary cardiomyopathy     Ischemic cardiomyopathy EF 15% ON ECHO 09-20-2017 DR GARG IN [facility]    Sepsis 08/2017    Syncope and collapse     PRIOR TO ICD/PACEMAKER PLACED 2014    Type 2 diabetes mellitus, controlled DX 1998    BS  AVG 100  A1C 7.4 Dec 27 2017    Wheeze     PT SEE HIS PMD April 01 2018 RE THIS-TOLD TO SEE PMD BY [facility] JUNE 12 WHEN THEY SAW HIM FOR A CL       Past Surgical History:  Past Surgical History:   Procedure Laterality Date    BIV  11/10/2016    ICD METRONIC  UPGRADE     CARDIAC CATHETERIZATION  03/2010    CORONARY STENT PLACEMENT X 2 PER PT; LM normal, LAD 70-80% distal lesion at apex, 20% lesion mid CFX    CARDIAC CATHETERIZATION  12/2012    CARDIAC DEFIBRILLATOR PLACEMENT  12/2012    [facility]    CARDIAC PACEMAKER PLACEMENT  2014    MEDTRONIC ICD/PACEMAKER PLACED [facility]    CIRCUMCISION  AGE 50    COLONOSCOPY  2009    CORRECTION HAMMER TOE      duodenal ulcer  1973    STRESS RELATED IN SCHOOL    ECHOCARDIOGRAM, TRANSTHORACIC  02/2016 11/2016 09/2017 12/2017    ECHOCARDIOGRAM, TRANSTHORACIC  02/2016,11/2016,12/20/2017    EGD N/A 08/15/2014    Procedure: EGD;  Surgeon: Colon Branch, MD;  Location: [facility];  Service: Gastroenterology;  Laterality: N/A;  egd w/ bx    EGD  1975    EGD, COLONOSCOPY N/A 04/04/2018    Procedure: EGD with bxs, COLONOSCOPY with polypectomy and clipping;  Surgeon: Doyne Keel, MD;  Location: [facility];  Service: Gastroenterology;  Laterality: N/A;    FRACTURE SURGERY  AGE 73     RT  ANKLE- CAST APPLIED    HERNIA REPAIR  AGE 50    LEFT INGUINAL HERNIA    MPI nuclear study  06/2016    orthopedic surgery  AGE 78    RT foot corrective - HAMMERTOE    TONSILLECTOMY AND ADENOIDECTOMY  1954       Family History:  Family History   Problem Relation Age of Onset    Breast cancer Mother     Heart attack Mother 2    Hypertension Mother     Diabetes Mother      Coronary artery disease Mother     Diabetes Brother     Heart attack Father     Hypertension Father        Social History:  Social History     Socioeconomic History    Marital status: Married     Spouse name: Teacher, music    Number of children: 0   Occupational History    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, history    Tobacco Use    Smoking status: Never    Smokeless tobacco: Never   Vaping Use    Vaping Use: Never used   Substance and Sexual Activity    Alcohol use: Not Currently    Drug use: No    Sexual activity: Yes     Partners: Female        The following sections were reviewed this encounter by the provider:   Tobacco  Allergies  Meds  Problems  Med Hx  Surg Hx  Fam Hx         Vitals:  BP 104/68 (BP Site: Right arm, Patient Position: Sitting, Cuff Size: Medium)   Pulse 85   Temp 98 F (36.7 C) (Temporal)   Resp 16   Wt 94.3 kg (207 lb 12.8 oz)   SpO2 98%   BMI 28.18 kg/m     ROS:  Constitutional: Negative for fever, chills and malaise/fatigue.   Respiratory: Negative for cough, shortness of breath and wheezing.    Cardiovascular: Negative for chest pain and palpitations.   Gastrointestinal: Negative for abdominal pain, diarrhea and constipation.   Musculoskeletal: Negative for joint pain.   Skin: Negative for rash.   Neurological: Negative for dizziness and headaches.   Psychiatric/Behavioral: The patient does not have insomnia.         Objective:     Physical Exam:  Constitutional: Patient is oriented to person, place, and time. Appears well-developed and well-nourished.   HENT:   Head: Normocephalic and atraumatic.   Eyes: Conjunctivae and EOM are normal. Pupils are equal, round, and reactive to light.   Cardiovascular: Normal rate, regular rhythm and normal heart sounds.  Exam reveals no gallop and no friction rub.    No murmur heard.  Pulmonary/Chest: Effort normal and breath sounds normal. Has no wheezes.   Musculoskeletal: Normal range of motion. Exhibits no edema or tenderness.   Neurological:  Patient is alert and oriented to person, place, and time.   Psychiatric: Patient has a normal mood and affect.        Assessment/Plan:         2. Stage 3 chronic renal impairment associated with type 2 diabetes mellitus  - Ambulatory referral to Nephrology; Future    3. Deep vein thrombosis (DVT) of other vein of right lower extremity, unspecified chronicity  More or less resolved.  Patient told me that he does not have any symptoms anymore.    4. Pure hypercholesterolemia  - Lipid panel; Future  Patient symptoms are well controlled under current treatment.  Recommend to continue medication as prescribed.    5. Type 2 diabetes mellitus with complication, with long-term current use of insulin  - Hemoglobin A1C; Future  - Urine Microalbumin Random; Future  Patient symptoms are well controlled under current treatment.  Recommend to continue medication as prescribed.    6. Essential hypertension  - CBC and differential; Future  - Comprehensive metabolic panel; Future  Patient symptoms are well controlled under current treatment.  Recommend to continue medication as prescribed.        Zorita Pang, MD

## 2022-08-04 NOTE — Progress Notes (Unsigned)
Have you seen any specialists since your last visit with [place]?  Yes      The patient was informed that the following HM items are still outstanding:   shingles vaccine and Tdap, Advanced Directive on file, Statin Use,

## 2022-08-06 ENCOUNTER — Other Ambulatory Visit (FREE_STANDING_LABORATORY_FACILITY): Payer: Commercial Managed Care - POS

## 2022-08-06 DIAGNOSIS — E7800 Pure hypercholesterolemia, unspecified: Secondary | ICD-10-CM

## 2022-08-06 DIAGNOSIS — E118 Type 2 diabetes mellitus with unspecified complications: Secondary | ICD-10-CM

## 2022-08-06 DIAGNOSIS — I10 Essential (primary) hypertension: Secondary | ICD-10-CM

## 2022-08-06 DIAGNOSIS — Z794 Long term (current) use of insulin: Secondary | ICD-10-CM

## 2022-08-06 LAB — CELL MORPHOLOGY
Cell Morphology: ABNORMAL — AB
Platelet Estimate: DECREASED — AB

## 2022-08-06 LAB — MICROALBUMIN, RANDOM URINE
Urine Creatinine, Random: 52.3 mg/dL
Urine Microalbumin, Random: 257 ug/ml — ABNORMAL HIGH (ref 0.0–30.0)
Urine Microalbumin/Creatinine Ratio: 491 ug/mg — ABNORMAL HIGH (ref 0–30)

## 2022-08-06 LAB — CBC AND DIFFERENTIAL
Absolute NRBC: 0.02 10*3/uL — ABNORMAL HIGH (ref 0.00–0.00)
Hematocrit: 40.3 % (ref 37.6–49.6)
Hgb: 12.8 g/dL (ref 12.5–17.1)
Instrument Absolute Neutrophil Count: 4.54 10*3/uL (ref 1.10–6.33)
MCH: 34.2 pg — ABNORMAL HIGH (ref 25.1–33.5)
MCHC: 31.8 g/dL (ref 31.5–35.8)
MCV: 107.8 fL — ABNORMAL HIGH (ref 78.0–96.0)
MPV: 12.9 fL — ABNORMAL HIGH (ref 8.9–12.5)
Nucleated RBC: 0.1 /100 WBC — ABNORMAL HIGH (ref 0.0–0.0)
Platelets: 124 10*3/uL — ABNORMAL LOW (ref 142–346)
RBC: 3.74 10*6/uL — ABNORMAL LOW (ref 4.20–5.90)
RDW: 16 % — ABNORMAL HIGH (ref 11–15)
WBC: 15.65 10*3/uL — ABNORMAL HIGH (ref 3.10–9.50)

## 2022-08-06 LAB — MAN DIFF ONLY
Band Neutrophils Absolute: 0 10*3/uL (ref 0.00–1.00)
Band Neutrophils: 0 %
Basophils Absolute Manual: 0 10*3/uL (ref 0.00–0.08)
Basophils Manual: 0 %
Eosinophils Absolute Manual: 0.31 10*3/uL (ref 0.00–0.44)
Eosinophils Manual: 2 %
Lymphocytes Absolute Manual: 10.64 10*3/uL — ABNORMAL HIGH (ref 0.42–3.22)
Lymphocytes Manual: 68 %
Monocytes Absolute: 0 10*3/uL — ABNORMAL LOW (ref 0.21–0.85)
Monocytes Manual: 0 %
Neutrophils Absolute Manual: 4.7 10*3/uL (ref 1.10–6.33)
Segmented Neutrophils: 30 %

## 2022-08-06 LAB — COMPREHENSIVE METABOLIC PANEL
ALT: 14 U/L (ref 0–55)
AST (SGOT): 15 U/L (ref 5–41)
Albumin/Globulin Ratio: 1.3 (ref 0.9–2.2)
Albumin: 3.9 g/dL (ref 3.5–5.0)
Alkaline Phosphatase: 79 U/L (ref 37–117)
Anion Gap: 10 (ref 5.0–15.0)
BUN: 38 mg/dL — ABNORMAL HIGH (ref 9.0–28.0)
Bilirubin, Total: 2.1 mg/dL — ABNORMAL HIGH (ref 0.2–1.2)
CO2: 26 mEq/L (ref 17–29)
Calcium: 9.5 mg/dL (ref 7.9–10.2)
Chloride: 105 mEq/L (ref 99–111)
Creatinine: 2.2 mg/dL — ABNORMAL HIGH (ref 0.5–1.5)
Globulin: 2.9 g/dL (ref 2.0–3.6)
Glucose: 144 mg/dL — ABNORMAL HIGH (ref 70–100)
Potassium: 4.1 mEq/L (ref 3.5–5.3)
Protein, Total: 6.8 g/dL (ref 6.0–8.3)
Sodium: 141 mEq/L (ref 135–145)
eGFR: 30.4 mL/min/{1.73_m2} — AB (ref >=60)

## 2022-08-06 LAB — HEMOGLOBIN A1C
Average Estimated Glucose: 122.6 mg/dL
Hemoglobin A1C: 5.9 % — ABNORMAL HIGH (ref 4.6–5.6)

## 2022-08-06 LAB — LIPID PANEL
Cholesterol / HDL Ratio: 3.6 Index
Cholesterol: 145 mg/dL (ref 0–199)
HDL: 40 mg/dL (ref 40–9999)
LDL Calculated: 91 mg/dL (ref 0–99)
Triglycerides: 71 mg/dL (ref 34–149)
VLDL Calculated: 14 mg/dL (ref 10–40)

## 2022-08-06 LAB — HEMOLYSIS INDEX: Hemolysis Index: 4 Index (ref 0–24)

## 2022-08-12 ENCOUNTER — Telehealth (INDEPENDENT_AMBULATORY_CARE_PROVIDER_SITE_OTHER): Payer: Self-pay

## 2022-08-12 NOTE — Telephone Encounter (Addendum)
Patient c/o L sided chest pain since 4am today with sob.  He states he has a history of MI.  Advised patient he should seek ED care right away.  He and his wife verbalized understanding.

## 2022-08-13 ENCOUNTER — Telehealth (INDEPENDENT_AMBULATORY_CARE_PROVIDER_SITE_OTHER): Payer: Self-pay | Admitting: Family Medicine

## 2022-08-13 NOTE — Telephone Encounter (Signed)
Called Pt's nephrologist office. Left voicemail with callback number. Wanted to see if office was able to provide Pt with a sooner appointment due to PCP's request

## 2022-08-14 ENCOUNTER — Encounter (INDEPENDENT_AMBULATORY_CARE_PROVIDER_SITE_OTHER): Payer: Commercial Managed Care - POS

## 2022-08-19 ENCOUNTER — Encounter (INDEPENDENT_AMBULATORY_CARE_PROVIDER_SITE_OTHER): Payer: Self-pay | Admitting: Family Medicine

## 2022-08-19 ENCOUNTER — Ambulatory Visit (INDEPENDENT_AMBULATORY_CARE_PROVIDER_SITE_OTHER): Payer: Commercial Managed Care - POS

## 2022-08-19 DIAGNOSIS — Z4502 Encounter for adjustment and management of automatic implantable cardiac defibrillator: Secondary | ICD-10-CM

## 2022-08-26 ENCOUNTER — Other Ambulatory Visit (INDEPENDENT_AMBULATORY_CARE_PROVIDER_SITE_OTHER): Payer: Self-pay | Admitting: Cardiovascular Disease

## 2022-08-26 LAB — IN OFFICE CARDIAC DEVICE MONITORING: AF Burden Percentage: 0.1

## 2022-08-27 ENCOUNTER — Encounter (INDEPENDENT_AMBULATORY_CARE_PROVIDER_SITE_OTHER): Payer: Commercial Managed Care - POS | Admitting: Nurse Practitioner

## 2022-08-27 NOTE — Progress Notes (Unsigned)
[facility] OFFICE PROGRESS NOTE    [facility] OFFICE -CARDIOLOGY  [address]  [place] [address]  Dept: [phone]  Dept Fax: [phone]       Patient Name: [name] [name]    Date of Visit:  August 27, 2022  Date of Birth: [birth year]  AGE: 76 y.o.  Medical Record #: [id number]      CHIEF COMPLAINT:    No chief complaint on file.      HISTORY OF PRESENT ILLNESS:    I had the pleasure of seeing Mr. Gervase Colberg today in follow up in the Advanced Heart Failure Clinic at [facility]. He is a pleasant 76 y.o. 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  For HF GDMT, he was previously on carvedilol 25 mg twice daily, Entresto 97/23 mg twice daily, Jardiance 10 mg daily, and spironolactone 12.5 mg daily. He has a Medtronic ICD in place.  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          PAST MEDICAL HISTORY: He has a past medical history of Acute CHF, Acute systolic (congestive) heart failure (08/2017), Anxiety, CAD (coronary artery disease), Cardiomyopathy, CHF (congestive heart failure) (08/12/2014, 2013), Chronic obstructive pulmonary disease, Coronary artery disease (2011), Depression, echocardiogram (02/2016, 11/2016, 09/2017, 12/20/2017), GERD (gastroesophageal reflux disease), Heart attack (2011), Hyperlipemia, Hypertension, ICD (implantable 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, Ischemic cardiomyopathy, Nonischemic cardiomyopathy, NSTEMI (non-ST elevated myocardial infarction), 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 He has a past surgical history that includes duodenal ulcer (1973); orthopedic surgery (AGE 11); EGD (N/A, 08/15/2014); Circumcision (AGE  37); Fracture surgery (AGE 4); Hernia repair (AGE 70); Colonoscopy (2009); Tonsillectomy and adenoidectomy (1954); Cardiac defibrillator placement (12/2012); Cardiac pacemaker placement (2014); EGD (1975); EGD, COLONOSCOPY (N/A, 04/04/2018); ECHOCARDIOGRAM, TRANSTHORACIC (02/2016 11/2016 09/2017 12/2017); MPI nuclear study (06/2016); Correction hammer toe; BIV (11/10/2016); ECHOCARDIOGRAM, TRANSTHORACIC (02/2016,11/2016,12/20/2017); Cardiac catheterization (03/2010); and Cardiac catheterization (12/2012).    ALLERGIES:   Allergies   Allergen Reactions    Allopurinol        MEDICATIONS:   Current Outpatient Medications:     apixaban (ELIQUIS) 5 MG, Take 2 tablets (10 mg) by mouth every 12 (twelve) hours For 1 week followed by 1 tablet twice daily, Disp: 90 tablet, Rfl: 2    aspirin EC 81 MG EC tablet, Take 1 tablet (81 mg) by mouth daily, Disp: , Rfl:     atorvastatin (LIPITOR) 40 MG tablet, Take 1 tablet (40 mg total) by mouth nightly, Disp: 90 tablet, Rfl: 0    Calquence 100 MG Tab, , Disp: , Rfl:     Continuous Blood Gluc Sensor (FreeStyle Libre 3 Sensor) Misc, Use 1 Device every 14 (fourteen) days, Disp: 6 each, Rfl: 3    EPINEPHrine 0.3 MG/0.3ML Solution Auto-injector injection, Inject 0.3 mLs (0.3 mg) into the muscle as needed (for Anaphylaxis), Disp: 2 each, Rfl: 0    fluticasone-salmeterol (ADVAIR DISKUS) 100-50 MCG/ACT Aerosol Pwdr, Breath Activated, Inhale 1 puff into the lungs 2 (two) times daily, Disp: 3 each, Rfl: 3    glucose blood (ONE TOUCH ULTRA TEST) test strip, 1 each by Other route 2 (two) times daily Use as instructed, Disp: 300 each, Rfl: 1    insulin glargine (LANTUS SOLOSTAR) 100 UNIT/ML injection  pen, Patient takes 50 units at night, Disp: 45 mL, Rfl: 3    Insulin Pen Needle (BD Pen Needle Nano U/F) 32G X 4 MM Misc, Use as directed bid, Disp: 200 each, Rfl: 3    Jardiance 10 MG tablet, TAKE 1 TABLET(10 MG) BY MOUTH DAILY, Disp: 90 tablet, Rfl: 3    loratadine (CLARITIN) 10 MG tablet, Take 1  tablet (10 mg) by mouth daily, Disp: 5 tablet, Rfl: 0    metOLazone (ZAROXOLYN) 10 MG tablet, Take 1 tablet (10 mg) by mouth daily, Disp: , Rfl:     metoprolol succinate XL (TOPROL-XL) 25 MG 24 hr tablet, Take 0.5 tablets (12.5 mg) by mouth daily, Disp: 45 tablet, Rfl: 3    niacin (NIASPAN) 500 MG CR tablet, TAKE 1 TABLET BY MOUTH TWICE DAILY, Disp: 180 tablet, Rfl: 3    Semglee, yfgn, 100 UNIT/ML Solution Pen-injector, ADMINISTER 50 UNITS UNDER THE SKIN AT BEDTIME, Disp: 45 mL, Rfl: 3    SITagliptin-metFORMIN (Janumet) 50-500 MG tablet, Take 1 tablet by mouth 2 (two) times daily with meals, Disp: 60 tablet, Rfl: 5    torsemide (DEMADEX) 20 MG tablet, Take 3 tablets (60 mg) by mouth 2 (two) times daily, Disp: 540 tablet, Rfl: 3    valsartan (DIOVAN) 40 MG tablet, Take 0.5 tablets (20 mg) by mouth daily, Disp: 90 tablet, Rfl: 3     FAMILY HISTORY: family history includes Breast cancer in his mother; Coronary artery disease in his mother; Diabetes in his brother and mother; Heart attack in his father; Heart attack (age of onset: 80) in his mother; Hypertension in his father and mother.    SOCIAL HISTORY: He reports that he has never smoked. He has never used smokeless tobacco. He reports that he does not currently use alcohol. He reports that he does not use drugs.      PHYSICAL EXAMINATION    There were no vitals taken for this visit.          Last 3 Weights:       07/03/2022    10:51 AM 07/24/2022    10:23 AM 08/04/2022     4:18 PM   Weight Monitoring   Height 182.9 cm 182.9 cm    Weight 93.895 kg 91.627 kg 94.257 kg   BMI (calculated) 28.1 kg/m2 27.5 kg/m2          General Appearance:  Cooperative and in no acute distress.    Skin: Warm and dry to touch  Head: Normocephalic  Eyes: Conjunctivae and lids unremarkable.    Neck: + JVD  Chest: Clear/diminished to auscultation bilaterally, DOE   Cardiovascular: Regular rhythm, S1 normal, S2 normal, No S3 or S4  Abdomen: Soft, nontender with normoactive bowel sounds.    Extremities: Warm/dry without edema, +2 b/l radial pulses  Neuro: Alert and oriented x3. No gross motor or sensory deficits noted, affect appropriate.      IMPRESSION:   Mr. Nagengast is a 76 y.o. male with the following problems:    Acute on chronic systolic CHF. Congestion and cough today   EF 25 to 30% by echo August 2022.  Moderate tricuspid and mitral regurgitation present.  PA pressures were 54 mmHg.   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  If no improvement in shortness of breath he understands to go to the ER for further evaluation.  Will arrange RN call on Monday to reassess symptoms  Repeat BMP to repeat renal function  Close follow-up with us in 2 weeks, or sooner if needed                                                  No orders of the defined types were placed in this encounter.      No orders of the defined types were placed in this encounter.        SIGNED:    Conception OmsJenny Chanse Kagel, NP          This note was generated by the Dragon speech recognition and may contain errors or omissions not intended by the user. Grammatical errors, random word insertions, deletions, pronoun errors, and incomplete sentences are occasional consequences of this technology due to software limitations. Not all errors are caught or corrected. If there are questions or concerns about the content of this note or information contained within the body of this dictation, they should be addressed directly with the author for clarification.

## 2022-09-03 ENCOUNTER — Telehealth (INDEPENDENT_AMBULATORY_CARE_PROVIDER_SITE_OTHER): Payer: Self-pay | Admitting: Family Medicine

## 2022-09-03 ENCOUNTER — Encounter (INDEPENDENT_AMBULATORY_CARE_PROVIDER_SITE_OTHER): Payer: Self-pay | Admitting: Family Medicine

## 2022-09-03 NOTE — Telephone Encounter (Signed)
Received a call from Dr. Huel Cote office in regards to Medical records request.     Faxed OV notes and List of Medication as requested.     Fax: [phone]  Phone: [phone]

## 2022-09-04 ENCOUNTER — Emergency Department: Payer: Commercial Managed Care - POS

## 2022-09-04 ENCOUNTER — Ambulatory Visit (INDEPENDENT_AMBULATORY_CARE_PROVIDER_SITE_OTHER): Payer: Commercial Managed Care - POS | Admitting: Nurse Practitioner

## 2022-09-04 ENCOUNTER — Inpatient Hospital Stay
Admission: AD | Admit: 2022-09-04 | Discharge: 2022-09-09 | DRG: 291 | Disposition: A | Discharge status: Other Acute Inpatient Hospital | Payer: Commercial Managed Care - POS | Source: Other Acute Inpatient Hospital | Attending: Internal Medicine | Admitting: Internal Medicine

## 2022-09-04 ENCOUNTER — Telehealth (INDEPENDENT_AMBULATORY_CARE_PROVIDER_SITE_OTHER): Payer: Self-pay

## 2022-09-04 ENCOUNTER — Encounter (INDEPENDENT_AMBULATORY_CARE_PROVIDER_SITE_OTHER): Payer: Self-pay | Admitting: Nurse Practitioner

## 2022-09-04 VITALS — BP 112/68 | HR 93 | Ht 72.0 in | Wt 213.0 lb

## 2022-09-04 DIAGNOSIS — I428 Other cardiomyopathies: Secondary | ICD-10-CM

## 2022-09-04 DIAGNOSIS — E669 Obesity, unspecified: Secondary | ICD-10-CM | POA: Diagnosis present

## 2022-09-04 DIAGNOSIS — Z8249 Family history of ischemic heart disease and other diseases of the circulatory system: Secondary | ICD-10-CM

## 2022-09-04 DIAGNOSIS — E11649 Type 2 diabetes mellitus with hypoglycemia without coma: Secondary | ICD-10-CM | POA: Diagnosis not present

## 2022-09-04 DIAGNOSIS — Z6828 Body mass index (BMI) 28.0-28.9, adult: Secondary | ICD-10-CM

## 2022-09-04 DIAGNOSIS — C9110 Chronic lymphocytic leukemia of B-cell type not having achieved remission: Secondary | ICD-10-CM | POA: Diagnosis present

## 2022-09-04 DIAGNOSIS — R7982 Elevated C-reactive protein (CRP): Secondary | ICD-10-CM

## 2022-09-04 DIAGNOSIS — Z7951 Long term (current) use of inhaled steroids: Secondary | ICD-10-CM

## 2022-09-04 DIAGNOSIS — I130 Hypertensive heart and chronic kidney disease with heart failure and stage 1 through stage 4 chronic kidney disease, or unspecified chronic kidney disease: Principal | ICD-10-CM | POA: Diagnosis present

## 2022-09-04 DIAGNOSIS — E785 Hyperlipidemia, unspecified: Secondary | ICD-10-CM | POA: Diagnosis present

## 2022-09-04 DIAGNOSIS — D696 Thrombocytopenia, unspecified: Secondary | ICD-10-CM | POA: Diagnosis present

## 2022-09-04 DIAGNOSIS — R9431 Abnormal electrocardiogram [ECG] [EKG]: Secondary | ICD-10-CM

## 2022-09-04 DIAGNOSIS — I4891 Unspecified atrial fibrillation: Secondary | ICD-10-CM | POA: Diagnosis present

## 2022-09-04 DIAGNOSIS — Z794 Long term (current) use of insulin: Secondary | ICD-10-CM

## 2022-09-04 DIAGNOSIS — I081 Rheumatic disorders of both mitral and tricuspid valves: Secondary | ICD-10-CM | POA: Diagnosis present

## 2022-09-04 DIAGNOSIS — I252 Old myocardial infarction: Secondary | ICD-10-CM

## 2022-09-04 DIAGNOSIS — I5082 Biventricular heart failure: Secondary | ICD-10-CM | POA: Diagnosis present

## 2022-09-04 DIAGNOSIS — I2510 Atherosclerotic heart disease of native coronary artery without angina pectoris: Secondary | ICD-10-CM | POA: Diagnosis present

## 2022-09-04 DIAGNOSIS — E1122 Type 2 diabetes mellitus with diabetic chronic kidney disease: Secondary | ICD-10-CM | POA: Diagnosis present

## 2022-09-04 DIAGNOSIS — Z7984 Long term (current) use of oral hypoglycemic drugs: Secondary | ICD-10-CM

## 2022-09-04 DIAGNOSIS — Z79899 Other long term (current) drug therapy: Secondary | ICD-10-CM

## 2022-09-04 DIAGNOSIS — J449 Chronic obstructive pulmonary disease, unspecified: Secondary | ICD-10-CM | POA: Diagnosis present

## 2022-09-04 DIAGNOSIS — I82401 Acute embolism and thrombosis of unspecified deep veins of right lower extremity: Secondary | ICD-10-CM | POA: Diagnosis present

## 2022-09-04 DIAGNOSIS — N1831 Chronic kidney disease, stage 3a: Secondary | ICD-10-CM | POA: Diagnosis present

## 2022-09-04 DIAGNOSIS — N179 Acute kidney failure, unspecified: Secondary | ICD-10-CM | POA: Diagnosis present

## 2022-09-04 DIAGNOSIS — I429 Cardiomyopathy, unspecified: Secondary | ICD-10-CM | POA: Insufficient documentation

## 2022-09-04 DIAGNOSIS — I5023 Acute on chronic systolic (congestive) heart failure: Secondary | ICD-10-CM | POA: Diagnosis present

## 2022-09-04 DIAGNOSIS — I959 Hypotension, unspecified: Secondary | ICD-10-CM | POA: Diagnosis present

## 2022-09-04 DIAGNOSIS — R7989 Other specified abnormal findings of blood chemistry: Secondary | ICD-10-CM

## 2022-09-04 DIAGNOSIS — G4730 Sleep apnea, unspecified: Secondary | ICD-10-CM | POA: Diagnosis present

## 2022-09-04 DIAGNOSIS — Z7982 Long term (current) use of aspirin: Secondary | ICD-10-CM

## 2022-09-04 DIAGNOSIS — I50813 Acute on chronic right heart failure: Principal | ICD-10-CM

## 2022-09-04 DIAGNOSIS — Z95810 Presence of automatic (implantable) cardiac defibrillator: Secondary | ICD-10-CM

## 2022-09-04 DIAGNOSIS — I5022 Chronic systolic (congestive) heart failure: Secondary | ICD-10-CM

## 2022-09-04 DIAGNOSIS — I255 Ischemic cardiomyopathy: Secondary | ICD-10-CM | POA: Diagnosis present

## 2022-09-04 DIAGNOSIS — Z91148 Patient's other noncompliance with medication regimen for other reason: Secondary | ICD-10-CM

## 2022-09-04 DIAGNOSIS — Z955 Presence of coronary angioplasty implant and graft: Secondary | ICD-10-CM

## 2022-09-04 DIAGNOSIS — Z7901 Long term (current) use of anticoagulants: Secondary | ICD-10-CM

## 2022-09-04 DIAGNOSIS — Z86718 Personal history of other venous thrombosis and embolism: Secondary | ICD-10-CM

## 2022-09-04 LAB — CBC AND DIFFERENTIAL
Absolute NRBC: 0.03 10*3/uL — ABNORMAL HIGH (ref 0.00–0.00)
Hematocrit: 39.1 % (ref 37.6–49.6)
Hgb: 12.6 g/dL (ref 12.5–17.1)
Instrument Absolute Neutrophil Count: 2.48 10*3/uL (ref 1.10–6.33)
MCH: 33.1 pg (ref 25.1–33.5)
MCHC: 32.2 g/dL (ref 31.5–35.8)
MCV: 102.6 fL — ABNORMAL HIGH (ref 78.0–96.0)
MPV: 13 fL — ABNORMAL HIGH (ref 8.9–12.5)
Nucleated RBC: 0.2 /100 WBC — ABNORMAL HIGH (ref 0.0–0.0)
Platelets: 118 10*3/uL — ABNORMAL LOW (ref 142–346)
RBC: 3.81 10*6/uL — ABNORMAL LOW (ref 4.20–5.90)
RDW: 17 % — ABNORMAL HIGH (ref 11–15)
WBC: 13.15 10*3/uL — ABNORMAL HIGH (ref 3.10–9.50)

## 2022-09-04 LAB — URINALYSIS WITH REFLEX TO MICROSCOPIC EXAM - REFLEX TO CULTURE
Bilirubin, UA: NEGATIVE
Blood, UA: NEGATIVE
Ketones UA: NEGATIVE
Leukocyte Esterase, UA: NEGATIVE
Nitrite, UA: NEGATIVE
Specific Gravity UA: 1.019 (ref 1.001–1.035)
Urine pH: 6 (ref 5.0–8.0)
Urobilinogen, UA: NORMAL mg/dL

## 2022-09-04 LAB — PT AND APTT
PT INR: 1.5 — ABNORMAL HIGH (ref 0.9–1.1)
PT: 17.1 s — ABNORMAL HIGH (ref 10.1–12.9)
PTT: 34 s (ref 27–39)

## 2022-09-04 LAB — MAN DIFF ONLY
Band Neutrophils Absolute: 0 10*3/uL (ref 0.00–1.00)
Band Neutrophils: 0 %
Basophils Absolute Manual: 0 10*3/uL (ref 0.00–0.08)
Basophils Manual: 0 %
Eosinophils Absolute Manual: 0.13 10*3/uL (ref 0.00–0.44)
Eosinophils Manual: 1 %
Lymphocytes Absolute Manual: 11.05 10*3/uL — ABNORMAL HIGH (ref 0.42–3.22)
Lymphocytes Manual: 84 %
Monocytes Absolute: 0.13 10*3/uL — ABNORMAL LOW (ref 0.21–0.85)
Monocytes Manual: 1 %
Neutrophils Absolute Manual: 1.84 10*3/uL (ref 1.10–6.33)
Segmented Neutrophils: 14 %

## 2022-09-04 LAB — C-REACTIVE PROTEIN HIGH SENSITIVE: C-Reactive Protein, High Sensitive: 2.2 mg/dL — ABNORMAL HIGH (ref 0.00–1.00)

## 2022-09-04 LAB — COMPREHENSIVE METABOLIC PANEL
ALT: 14 U/L (ref 0–55)
AST (SGOT): 16 U/L (ref 5–41)
Albumin/Globulin Ratio: 1.3 (ref 0.9–2.2)
Albumin: 3.9 g/dL (ref 3.5–5.0)
Alkaline Phosphatase: 69 U/L (ref 37–117)
Anion Gap: 10 (ref 5.0–15.0)
BUN: 46 mg/dL — ABNORMAL HIGH (ref 9.0–28.0)
Bilirubin, Total: 2.4 mg/dL — ABNORMAL HIGH (ref 0.2–1.2)
CO2: 21 mEq/L (ref 17–29)
Calcium: 9.5 mg/dL (ref 7.9–10.2)
Chloride: 109 mEq/L (ref 99–111)
Creatinine: 2 mg/dL — ABNORMAL HIGH (ref 0.5–1.5)
Globulin: 3 g/dL (ref 2.0–3.6)
Glucose: 141 mg/dL — ABNORMAL HIGH (ref 70–100)
Potassium: 4.5 mEq/L (ref 3.5–5.3)
Protein, Total: 6.9 g/dL (ref 6.0–8.3)
Sodium: 140 mEq/L (ref 135–145)
eGFR: 34 mL/min/{1.73_m2} — AB (ref >=60)

## 2022-09-04 LAB — ECG 12-LEAD
Atrial Rate: 89 {beats}/min
P Axis: 69 degrees
P-R Interval: 150 ms
Q-T Interval: 416 ms
QRS Duration: 94 ms
QTC Calculation (Bezet): 506 ms
R Axis: -5 degrees
T Axis: 102 degrees
Ventricular Rate: 89 {beats}/min

## 2022-09-04 LAB — COVID-19 (SARS-COV-2) AND INFLUENZA A/B AND RSV
Influenza A: NEGATIVE
Influenza B: NEGATIVE
Respiratory Syncytial Virus: NEGATIVE
SARS-CoV-2 Overall Result: NEGATIVE

## 2022-09-04 LAB — PHOSPHORUS: Phosphorus: 4 mg/dL (ref 2.3–4.7)

## 2022-09-04 LAB — HIGH SENSITIVITY TROPONIN-I: hs Troponin-I: 66.2 ng/L — CR

## 2022-09-04 LAB — HIGH SENSITIVITY TROPONIN-I WITH DELTA
hs Troponin-I Delta: -7.3 ng/L
hs Troponin-I: 58.9 ng/L — AB

## 2022-09-04 LAB — CELL MORPHOLOGY
Cell Morphology: NORMAL
Platelet Estimate: DECREASED — AB

## 2022-09-04 LAB — NT-PROBNP: NT-proBNP: 11362 pg/mL — ABNORMAL HIGH (ref 0–450)

## 2022-09-04 LAB — MAGNESIUM: Magnesium: 2.3 mg/dL (ref 1.6–2.6)

## 2022-09-04 LAB — WHOLE BLOOD GLUCOSE POCT
Whole Blood Glucose POCT: 158 mg/dL — ABNORMAL HIGH (ref 70–100)
Whole Blood Glucose POCT: 196 mg/dL — ABNORMAL HIGH (ref 70–100)

## 2022-09-04 LAB — SEDIMENTATION RATE: Sed Rate: 24 mm/Hr — ABNORMAL HIGH (ref 0–15)

## 2022-09-04 MED ORDER — VALSARTAN 40 MG PO TABS
20.0000 mg | ORAL_TABLET | Freq: Every day | ORAL | Status: DC
Start: 2022-09-04 — End: 2022-09-09
  Administered 2022-09-04 – 2022-09-07 (×2): 20 mg via ORAL
  Filled 2022-09-04 (×7): qty 1

## 2022-09-04 MED ORDER — ONDANSETRON HCL 4 MG/2ML IJ SOLN
4.0000 mg | Freq: Once | INTRAMUSCULAR | Status: AC
Start: 2022-09-04 — End: 2022-09-04
  Administered 2022-09-04: 4 mg via INTRAVENOUS
  Filled 2022-09-04: qty 2

## 2022-09-04 MED ORDER — INSULIN LISPRO 100 UNIT/ML SOLN (WRAP)
1.0000 [IU] | Freq: Every evening | Status: DC
Start: 2022-09-04 — End: 2022-09-09
  Administered 2022-09-04 – 2022-09-06 (×3): 1 [IU] via SUBCUTANEOUS
  Administered 2022-09-07: 2 [IU] via SUBCUTANEOUS
  Administered 2022-09-08: 1 [IU] via SUBCUTANEOUS
  Filled 2022-09-04 (×3): qty 3
  Filled 2022-09-04: qty 6
  Filled 2022-09-04 (×2): qty 3

## 2022-09-04 MED ORDER — BUMETANIDE 0.25 MG/ML IJ SOLN
3.0000 mg | Freq: Once | INTRAMUSCULAR | Status: AC
Start: 2022-09-04 — End: 2022-09-04
  Administered 2022-09-04: 3 mg via INTRAVENOUS
  Filled 2022-09-04 (×2): qty 20

## 2022-09-04 MED ORDER — INSULIN GLARGINE 100 UNIT/ML SC SOLN
40.0000 [IU] | Freq: Every evening | SUBCUTANEOUS | Status: DC
Start: 2022-09-04 — End: 2022-09-06
  Administered 2022-09-04 – 2022-09-05 (×2): 40 [IU] via SUBCUTANEOUS
  Filled 2022-09-04 (×2): qty 40

## 2022-09-04 MED ORDER — ASPIRIN 81 MG PO TBEC
81.0000 mg | DELAYED_RELEASE_TABLET | Freq: Every day | ORAL | Status: DC
Start: 2022-09-04 — End: 2022-09-09
  Administered 2022-09-04 – 2022-09-08 (×5): 81 mg via ORAL
  Filled 2022-09-04 (×5): qty 1

## 2022-09-04 MED ORDER — ATORVASTATIN CALCIUM 20 MG PO TABS
40.0000 mg | ORAL_TABLET | Freq: Every evening | ORAL | Status: DC
Start: 2022-09-04 — End: 2022-09-09
  Administered 2022-09-04 – 2022-09-08 (×5): 40 mg via ORAL
  Filled 2022-09-04 (×5): qty 2

## 2022-09-04 MED ORDER — GLUCOSE 40 % PO GEL (WRAP)
15.0000 g | ORAL | Status: DC | PRN
Start: 2022-09-04 — End: 2022-09-09

## 2022-09-04 MED ORDER — FLUTICASONE FUROATE-VILANTEROL 100-25 MCG/ACT IN AEPB
1.0000 | INHALATION_SPRAY | Freq: Every morning | RESPIRATORY_TRACT | Status: DC
Start: 2022-09-04 — End: 2022-09-09
  Administered 2022-09-06 – 2022-09-09 (×4): 1 via RESPIRATORY_TRACT
  Filled 2022-09-04: qty 14

## 2022-09-04 MED ORDER — DEXTROSE 50 % IV SOLN
12.5000 g | INTRAVENOUS | Status: DC | PRN
Start: 2022-09-04 — End: 2022-09-09

## 2022-09-04 MED ORDER — EMPAGLIFLOZIN 10 MG PO TABS
10.0000 mg | ORAL_TABLET | Freq: Every day | ORAL | Status: DC
Start: 2022-09-04 — End: 2022-09-09
  Administered 2022-09-04 – 2022-09-08 (×5): 10 mg via ORAL
  Filled 2022-09-04 (×5): qty 1

## 2022-09-04 MED ORDER — METOPROLOL SUCCINATE ER 25 MG PO TB24
12.5000 mg | ORAL_TABLET | Freq: Every day | ORAL | Status: DC
Start: 2022-09-04 — End: 2022-09-09
  Administered 2022-09-04 – 2022-09-07 (×2): 12.5 mg via ORAL
  Filled 2022-09-04 (×4): qty 1

## 2022-09-04 MED ORDER — INSULIN LISPRO 100 UNIT/ML SOLN (WRAP)
1.0000 [IU] | Freq: Three times a day (TID) | Status: DC
Start: 2022-09-04 — End: 2022-09-09
  Administered 2022-09-05: 2 [IU] via SUBCUTANEOUS
  Administered 2022-09-05: 1 [IU] via SUBCUTANEOUS
  Administered 2022-09-06 – 2022-09-08 (×2): 2 [IU] via SUBCUTANEOUS
  Filled 2022-09-04 (×3): qty 6
  Filled 2022-09-04: qty 3

## 2022-09-04 MED ORDER — GLUCAGON 1 MG IJ SOLR (WRAP)
1.0000 mg | INTRAMUSCULAR | Status: DC | PRN
Start: 2022-09-04 — End: 2022-09-09

## 2022-09-04 MED ORDER — ALBUTEROL-IPRATROPIUM 2.5-0.5 (3) MG/3ML IN SOLN
3.0000 mL | Freq: Four times a day (QID) | RESPIRATORY_TRACT | Status: DC | PRN
Start: 2022-09-04 — End: 2022-09-09
  Administered 2022-09-09: 3 mL via RESPIRATORY_TRACT

## 2022-09-04 MED ORDER — DEXTROSE 10 % IV BOLUS
12.5000 g | INTRAVENOUS | Status: DC | PRN
Start: 2022-09-04 — End: 2022-09-09
  Administered 2022-09-08: 125 mL via INTRAVENOUS
  Filled 2022-09-04: qty 250

## 2022-09-04 MED ORDER — MORPHINE SULFATE 4 MG/ML IJ/IV SOLN (WRAP)
4.0000 mg | Freq: Once | Status: AC
Start: 2022-09-04 — End: 2022-09-04
  Administered 2022-09-04: 4 mg via INTRAVENOUS
  Filled 2022-09-04: qty 1

## 2022-09-04 MED ORDER — APIXABAN 5 MG PO TABS
5.0000 mg | ORAL_TABLET | Freq: Two times a day (BID) | ORAL | Status: DC
Start: 2022-09-04 — End: 2022-09-09
  Administered 2022-09-04 – 2022-09-07 (×6): 5 mg via ORAL
  Filled 2022-09-04 (×6): qty 1

## 2022-09-04 MED ORDER — METOLAZONE 5 MG PO TABS
10.0000 mg | ORAL_TABLET | Freq: Every day | ORAL | Status: DC
Start: 2022-09-04 — End: 2022-09-07
  Administered 2022-09-04 – 2022-09-07 (×4): 10 mg via ORAL
  Filled 2022-09-04 (×3): qty 2

## 2022-09-04 MED ORDER — BUMETANIDE 0.25 MG/ML IJ SOLN
2.0000 mg | Freq: Two times a day (BID) | INTRAMUSCULAR | Status: DC
Start: 2022-09-05 — End: 2022-09-07
  Administered 2022-09-05 – 2022-09-07 (×5): 2 mg via INTRAVENOUS
  Filled 2022-09-04 (×5): qty 10

## 2022-09-04 NOTE — Plan of Care (Signed)
Problem: Moderate/High Fall Risk Score >5  Goal: Patient will remain free of falls  Flowsheets (Taken 09/04/2022 2002)  Moderate Risk (6-13): LOW-Fall Interventions Appropriate for Low Fall Risk     Problem: Pain interferes with ability to perform ADL  Goal: Pain at adequate level as identified by patient  Outcome: Progressing  Flowsheets (Taken 09/04/2022 2002)  Pain at adequate level as identified by patient: Identify patient comfort function goal     Problem: Side Effects from Pain Analgesia  Goal: Patient will experience minimal side effects of analgesic therapy  Outcome: Progressing     Problem: Everyday - Heart Failure  Goal: Stable Vital Signs and Fluid Balance  Outcome: Progressing  Flowsheets (Taken 09/04/2022 2002)  Stable Vital Signs and Fluid Balance:   Fluid Restriction   Assess for swelling/edema   Strict Intake/Output   Wean oxygen as needed if appropriate   Monitor labs and report abnormalities to physician   Monitor, assess vital signs and telemetry per policy   Daily Standing Weights in the morning using the same scale, after using the bathroom and before breadfast.  If unable to stand, zero the bed and use the bed scale

## 2022-09-04 NOTE — Telephone Encounter (Signed)
Noted pt was sent to [facility] ER from office visit today.

## 2022-09-04 NOTE — ED Triage Notes (Signed)
Pt brought in via wheelchair Pt was sent from [facility], pt has had weight gain, increasing shortness of breath at rest, worsens on exertion. Also, c/o bilateral leg swelling, upper chest discomfort, described as "hammering".Gained about 8lbs in the last two weeks.

## 2022-09-04 NOTE — ED Provider Notes (Signed)
[facility] EMERGENCY DEPARTMENT  ATTENDING PHYSICIAN HISTORY AND PHYSICAL EXAM     Patient Name: [name] [name], [name] [name]  Department:[facility]  Encounter Date:  09/04/2022  Attending Physician: Melvern Sample, DO   Age: 76 y.o. male  Patient Room: Room/bed info not found  PCP: Zorita Pang, MD           Diagnosis/Disposition:     Final diagnoses:   None       ED Disposition       None            Follow-Up Providers (if applicable)    No follow-up provider specified.     New Prescriptions    No medications on file           Medical Decision Making:           Differential diagnosis: CHF, pneumonia, MI, dehydration, electrolyte disorder, flu, COVID, RSV.      EKG Interpretation    EKG interpreted by Melvern Sample, DO  Time: 1148  Rate: Normal  Rhythm: A sensed V paced rhythm  Axis: Normal  ST Segments ant T waves: No acute ST or T wave changes  Conduction: No blocks  Impression: Paced rhythm      No significant change when compared from previous EKG on Mar 12, 2019.    Patient on cardiac monitor interpreted by me (findings): Paced rhythm      ED course/Plan: Patient was seen and examined by me. ***                 I have reviewed the nursing history.     DR. Melvern Sample  is the primary attending for this patient and has obtained and performed the history, PE, and medical decision making for this patient.      Number and Complexity of Problems Addressed (select at least one)    {Complexity:58839}    Presenting acute/chronic problems: ***    Chronic illness impacting care (obesity, diabetes, hypertension, elderly - state impact): ***    ______________________________________________________________________  Amount/complexity of Data Reviewed   {Tip - Level 4 = (1/3 of the following categories); Level 5 = (2/3 categories).   This message will disappear upon signing. :  L\    History obtained from another historian (parent, spouse, caregiver, ems) : ***    Review of external records (date of record reviewed): ***    Independent  visualized and interpretation of radiological study by me: ***    Independently ordered and reviewed labs by me. ***    Diagnostic tests appropriately considered even if not ultimately performed (Labs and or radiology): ***    Document Rx meds appropriately considered, even if not given (Abx, antiviral, pain, etc)***    Discussion of test interpretation with external physician/provider:  ***    Discussion of management with other providers:   Consulted with  ***.   Patient condition and all pertinent labs and/or radiology studies discussed with physician.     Care affected by social determinant of health (access to medical care, homeless, lack of resources):***    Considered observation in the hospital and or admission but: ***  _____________________________________________________________________    Risk of Complications and/or Morbidity or Mortality of Patient Management  (select one if applicable)    {ZOXW:96045}      Counseled pt about diagnosis and/or relevant lab/radiology results if any present. Recommended immediate ED visit for any new or worsening problems or concerns. Patient expressed understanding and agreement with discharge plan.  Well appearing at discharge. Close and timed follow up recommended.    Results       ** No results found for the last 24 hours. **            Radiology Results (24 Hour)       ** No results found for the last 24 hours. **               Nursing notes from this date of service were reviewed.    Past Medical History:     Past Medical History:   Diagnosis Date    Acute CHF     NOV  & DEC 2018, - DIALYSIS CATH PLACED Aug 24 2017 TO REMOVE FLUID     Acute systolic (congestive) heart failure 08/2017    Anxiety     CAD (coronary artery disease)     Cardiomyopathy     nonischemic    CHF (congestive heart failure) 08/12/2014, 2013    Chronic obstructive pulmonary disease     POSSIBLE PER PT HE USES  SYNBICORT BID ABD  BREO INHALER PRN    Coronary artery disease 2011    CORONARY STENT  PLACEMENT FOLLOWED BY [facility]    Depression     echocardiogram 02/2016, 11/2016, 09/2017, 12/20/2017    GERD (gastroesophageal reflux disease)     Heart attack 2011    and 2014    Hyperlipemia     Hypertension     ICD (implantable 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     PLACED 2014  [facility]  LAST INTERROGATION April 01 2018 REPORT REQUESTED    Ischemic cardiomyopathy     EF 15% ON ECHO 09-20-2017 DR GARG IN EPIC    Nonischemic cardiomyopathy     NSTEMI (non-ST elevated myocardial infarction)     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    Pacemaker 2014    MEDTRONIC ICD/PACEMAKER COMBO LAST INTERROGATION  12-12 2018    Pneumonia 09/2016    Primary cardiomyopathy     Ischemic cardiomyopathy EF 15% ON ECHO 09-20-2017 DR GARG IN EPIC    Sepsis 08/2017    Syncope and collapse     PRIOR TO ICD/PACEMAKER PLACED 2014    Type 2 diabetes mellitus, controlled DX 1998    BS  AVG 100  A1C 7.4 Dec 27 2017    [facility]     PT SEE HIS PMD April 01 2018 RE THIS-TOLD TO SEE PMD BY [facility] JUNE 12 WHEN THEY SAW HIM FOR A CL     Immunizations:    Past Surgical History:     Past Surgical History:   Procedure Laterality Date    BIV  11/10/2016    ICD METRONIC  UPGRADE     CARDIAC CATHETERIZATION  03/2010    CORONARY STENT PLACEMENT X 2 PER PT; LM normal, LAD 70-80% distal lesion at apex, 20% lesion mid CFX    CARDIAC CATHETERIZATION  12/2012    CARDIAC DEFIBRILLATOR PLACEMENT  12/2012    [facility]    CARDIAC PACEMAKER PLACEMENT  2014    MEDTRONIC ICD/PACEMAKER PLACED [facility]    CIRCUMCISION  AGE 6    COLONOSCOPY  2009    CORRECTION HAMMER TOE      duodenal ulcer  1973    STRESS RELATED IN SCHOOL    ECHOCARDIOGRAM, TRANSTHORACIC  02/2016 11/2016 09/2017 12/2017    ECHOCARDIOGRAM, TRANSTHORACIC  02/2016,11/2016,12/20/2017    EGD N/A 08/15/2014    Procedure: EGD;  Surgeon: Colon Branch,  MD;  Location: [facility];  Service: Gastroenterology;  Laterality: N/A;  egd w/ bx    EGD  1975    EGD, COLONOSCOPY N/A 04/04/2018    Procedure: EGD with bxs,  COLONOSCOPY with polypectomy and clipping;  Surgeon: Doyne Keel, MD;  Location: [facility];  Service: Gastroenterology;  Laterality: N/A;    FRACTURE SURGERY  AGE 40     RT  ANKLE- CAST APPLIED    HERNIA REPAIR  AGE 39    LEFT INGUINAL HERNIA    MPI nuclear study  06/2016    orthopedic surgery  AGE 57    RT foot corrective - HAMMERTOE    TONSILLECTOMY AND ADENOIDECTOMY  1954       Family History:     Family History   Problem Relation Age of Onset    Breast cancer Mother     Heart attack Mother 6    Hypertension Mother     Diabetes Mother     Coronary artery disease Mother     Diabetes Brother     Heart attack Father     Hypertension Father        Social History:     Social History     Socioeconomic History    Marital status: Married     Spouse name: Teacher, music    Number of children: 0    Years of education: Not on file    Highest education level: Not on file   Occupational History    Occupation: Runner, 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, [place]    Tobacco Use    Smoking status: Never    Smokeless tobacco: Never   Vaping Use    Vaping Use: Never used   Substance and Sexual Activity    Alcohol use: Not Currently    Drug use: No    Sexual activity: Yes     Partners: Female   Other Topics Concern    Not on file   Social History Narrative    Not on file     Social Determinants of Health     Financial Resource Strain: Not on file   Food Insecurity: No Food Insecurity (09/04/2022)    Hunger Vital Sign     Worried About Running Out of Food in the Last Year: Never true     Ran Out of Food in the Last Year: Never true   Transportation Needs: Not on file   Physical Activity: Not on file   Stress: Not on file   Social Connections: Not on file   Intimate Partner Violence: Not At Risk (09/04/2022)    Humiliation, Afraid, Rape, and Kick questionnaire     Fear of Current or Ex-Partner: No     Emotionally Abused: No     Physically Abused: No     Sexually Abused: No   Housing Stability: Not on file       Allergies:     Allergies   Allergen  Reactions    Allopurinol        Medications:   No current facility-administered medications for this encounter.    Current Outpatient Medications:     apixaban (ELIQUIS) 5 MG, Take 2 tablets (10 mg) by mouth every 12 (twelve) hours For 1 week followed by 1 tablet twice daily, Disp: 90 tablet, Rfl: 2    aspirin EC 81 MG EC tablet, Take 1 tablet (81 mg) by mouth daily, Disp: , Rfl:     atorvastatin (LIPITOR) 40 MG tablet, Take 1  tablet (40 mg total) by mouth nightly, Disp: 90 tablet, Rfl: 0    Calquence 100 MG Tab, , Disp: , Rfl:     Continuous Blood Gluc Sensor (FreeStyle Libre 3 Sensor) Misc, Use 1 Device every 14 (fourteen) days, Disp: 6 each, Rfl: 3    EPINEPHrine 0.3 MG/0.3ML Solution Auto-injector injection, Inject 0.3 mLs (0.3 mg) into the muscle as needed (for Anaphylaxis), Disp: 2 each, Rfl: 0    fluticasone-salmeterol (ADVAIR DISKUS) 100-50 MCG/ACT Aerosol Pwdr, Breath Activated, Inhale 1 puff into the lungs 2 (two) times daily (Patient not taking: Reported on 09/04/2022), Disp: 3 each, Rfl: 3    glucose blood (ONE TOUCH ULTRA TEST) test strip, 1 each by Other route 2 (two) times daily Use as instructed, Disp: 300 each, Rfl: 1    insulin glargine (LANTUS SOLOSTAR) 100 UNIT/ML injection pen, Patient takes 50 units at night, Disp: 45 mL, Rfl: 3    Insulin Pen Needle (BD Pen Needle Nano U/F) 32G X 4 MM Misc, Use as directed bid, Disp: 200 each, Rfl: 3    Jardiance 10 MG tablet, TAKE 1 TABLET(10 MG) BY MOUTH DAILY, Disp: 90 tablet, Rfl: 3    loratadine (CLARITIN) 10 MG tablet, Take 1 tablet (10 mg) by mouth daily, Disp: 5 tablet, Rfl: 0    metOLazone (ZAROXOLYN) 10 MG tablet, Take 1 tablet (10 mg) by mouth daily, Disp: , Rfl:     metoprolol succinate XL (TOPROL-XL) 25 MG 24 hr tablet, Take 0.5 tablets (12.5 mg) by mouth daily, Disp: 45 tablet, Rfl: 3    niacin (NIASPAN) 500 MG CR tablet, TAKE 1 TABLET BY MOUTH TWICE DAILY (Patient taking differently: 250 mg Taking half a tablet), Disp: 180 tablet, Rfl: 3     Semglee, yfgn, 100 UNIT/ML Solution Pen-injector, ADMINISTER 50 UNITS UNDER THE SKIN AT BEDTIME, Disp: 45 mL, Rfl: 3    SITagliptin-metFORMIN (Janumet) 50-500 MG tablet, Take 1 tablet by mouth 2 (two) times daily with meals, Disp: 60 tablet, Rfl: 5    torsemide (DEMADEX) 20 MG tablet, Take 3 tablets (60 mg) by mouth 2 (two) times daily, Disp: 540 tablet, Rfl: 3    valsartan (DIOVAN) 40 MG tablet, Take 0.5 tablets (20 mg) by mouth daily, Disp: 90 tablet, Rfl: 3                         History of Presenting Illness:   {THIS REVIEW SECTION WILL AUTODELETE ONCE NOTE IS SIGNED  History Review Links   Snapshot  Problem List  Care Everywhere  PMH  PSH  FamHx  SocHx  Allergies  Meds  Immunizations  END REVIEW SECTION(Optional):55325}  Nursing Triage note: Sent from [facility] for follow up appointment. Pt has had weight gain, increasing shortness of breath. Gained about 8lbs in the last two weeks. Pt also complains of upper chest discomfort across the top of the chest. Feels like "hammering".  Chief complaint: Shortness of Breath    [name] [name] is a 76 y.o. male presents from [facility].  Increasing shortness of breath over the past 2 weeks with 8 pound weight gain.  Also complains of chest pressure.  Patient also complains of edema in the lower extremity.  He takes torsemide for CHF.  No known ill exposures.  No recent travel or trauma.  Patient has more shortness of breath when he lays flat    {The patient needed interpreter services to provide history (Optional):29381}  Review of Systems:  Physical Exam:     Review of Systems   Constitutional:  Positive for fatigue. Negative for diaphoresis and fever.   HENT:  Negative for congestion, ear pain and sore throat.    Eyes:  Negative for pain and visual disturbance.   Respiratory:  Positive for cough and shortness of breath. Negative for chest tightness and wheezing.    Cardiovascular:  Positive for leg swelling. Negative for chest pain  and palpitations.   Gastrointestinal:  Negative for abdominal pain, constipation, diarrhea, nausea and vomiting.   Genitourinary:  Negative for dysuria and urgency.   Musculoskeletal:  Negative for back pain, gait problem and neck pain.   Skin:  Negative for color change and rash.   Neurological:  Negative for dizziness, weakness and headaches.   Psychiatric/Behavioral:  Negative for confusion. The patient is not nervous/anxious.          Pulse 91  BP 118/87  Resp 20  SpO2 99 %  Temp 97.2 F (36.2 C)     Physical Exam  Vitals and nursing note reviewed.   Constitutional:       Appearance: He is well-developed. He is obese. He is not diaphoretic.   HENT:      Head: Normocephalic and atraumatic.      Right Ear: External ear normal.      Left Ear: External ear normal.      Mouth/Throat:      Mouth: Mucous membranes are dry.      Pharynx: No oropharyngeal exudate.   Eyes:      General: No scleral icterus.        Right eye: No discharge.         Left eye: No discharge.      Conjunctiva/sclera: Conjunctivae normal.      Pupils: Pupils are equal, round, and reactive to light.   Neck:      Vascular: No JVD.      Trachea: No tracheal deviation.   Cardiovascular:      Rate and Rhythm: Normal rate and regular rhythm.      Heart sounds: Normal heart sounds. No murmur heard.  Pulmonary:      Effort: Pulmonary effort is normal.      Breath sounds: Normal breath sounds. No wheezing.   Chest:      Chest wall: No tenderness.   Abdominal:      General: Bowel sounds are normal. There is no distension.      Palpations: Abdomen is soft.      Tenderness: There is no abdominal tenderness. There is no guarding.   Musculoskeletal:         General: No tenderness.      Cervical back: Normal range of motion and neck supple.   Skin:     General: Skin is warm and dry.      Coloration: Skin is not pale.   Neurological:      Mental Status: He is alert and oriented to person, place, and time.      Coordination: Coordination normal.    Psychiatric:         Behavior: Behavior normal.         Judgment: Judgment normal.              Interpretations, Clinical Decision Tools and Critical Care:   {THIS REVIEW SECTION WILL AUTODELETE ONCE NOTE IS SIGNED  Interpretation/Data Review Links   Snapshot  Patient Care Timeline  Results Review  EKG  Labs  Imaging  Media  Encounter Orders   END REVIEW SECTION(Optional):55325}  {IHSEDINTERPRETATIONS (Optional):61541}         *This note was generated by the Epic EMR system/Voice recognition system and may contain inherent errors or omissions not intended by the user. Grammatical errors, random word insertions, deletions, pronoun errors and incomplete sentences are occasional consequences of this technology due to software limitations. Not all errors are caught or corrected. If there are questions or concerns about the content of this note or information contained within the body of this dictation they should be addressed directly with the author for clarification.            Procedures:   Procedures

## 2022-09-04 NOTE — ED Notes (Signed)
S/w receiving nurse Kabita on 51N; notified pt still needs his bumex dose and his 2nd troponin draw is due. She says they will do.

## 2022-09-04 NOTE — Plan of Care (Signed)
Problem: Moderate/High Fall Risk Score >5  Goal: Patient will remain free of falls  Flowsheets (Taken 09/04/2022 1142 by Meryle Ready, RN)  High (Greater than 13):   HIGH-Visual cue at entrance to patient's room   HIGH-Bed alarm on at all times while patient in bed   HIGH-Apply yellow "Fall Risk" arm band     Problem: Day of Admission - Heart Failure  Goal: Heart Failure Admission  Flowsheets (Taken 09/04/2022 1721)  Heart Failure Admission:   Standing Weight on admission, if unable to stand zero the bed and use the bed scale   Vital signs and telemetry per policy   Strict Intake/Output   Fluid restriction   Oxygen as needed   Assess for swelling/edema and document   Initiate education with patient and caregiver using CHF Warning Zones and Educational Videos ([employer] or [employer])     Problem: Everyday - Heart Failure  Goal: Stable Vital Signs and Fluid Balance  Flowsheets (Taken 09/04/2022 1721)  Stable Vital Signs and Fluid Balance:   Daily Standing Weights in the morning using the same scale, after using the bathroom and before breadfast.  If unable to stand, zero the bed and use the bed scale   Monitor, assess vital signs and telemetry per policy   Monitor labs and report abnormalities to physician   Strict Intake/Output   Fluid Restriction   Assess for swelling/edema   Wean oxygen as needed if appropriate  Goal: Mobility/Activity is Maintained at Optimal Level for Patient  Flowsheets (Taken 09/04/2022 1721)  Mobility/Activity is Maintained at Optimal Level for Patient:   Increase mobility as tolerated/progressive mobility protocol   Maintain SCD's as Ordered   Perform active/passive ROM   Reposition patient every 2 hours and as needed unless able to reposition self   Assess for changes in respiratory status, level of consciousness and/or development of fatigue  Goal: Teaching-Using CHF Warning Zones and Educational Videos  Flowsheets (Taken 09/04/2022 1722)  Teaching-Using CHF Warning Zones and  Educational Videos:   Signs & Symptoms of CHF   Daily Standing Weights & record   CHF Warning Zones and when to call for help   Sodium Restriction   Fluid Restriction if appropriate   Document in the Education Tab in EPIC with Teach-back     Problem: Everyday - Heart Failure  Goal: Teaching-Using CHF Warning Zones and Educational Videos  Flowsheets (Taken 09/04/2022 1722)  Teaching-Using CHF Warning Zones and Educational Videos:   Signs & Symptoms of CHF   Daily Standing Weights & record   CHF Warning Zones and when to call for help   Sodium Restriction   Fluid Restriction if appropriate   Document in the Education Tab in EPIC with Teach-back

## 2022-09-04 NOTE — Progress Notes (Signed)
09/04/22 1531   Vital Signs   Temp 98 F (36.7 C)   Temp Source Temporal   Heart Rate 90   Pulse (SpO2) 91   Resp Rate 15   BP 113/72   BP Location Left arm   BP Method Automatic   MAP (mmHg) 80   Patient Position Lying   Oxygen Therapy   SpO2 (!) 72 %   O2 Device None (Room air)     Patient alert and oriented, spo2 is low oxygen 2 l/min via nasal canula given, denies any pain , has some shortness of breath with ambulation.Skin is intact , has bilateral lower extremity edema +2.
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
## 2022-09-04 NOTE — H&P (Signed)
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    #Acute on chronic systolic congestive heart failure exacerbation with presenting symptoms of lower extremities nonpitting edema, weight gain of about 8 pounds, orthopnea, shortness of breath and wheezing  Last echocardiogram 06/07/2021 revealed EF of 25 to 30% moderate tricuspid and mitral regurgitation  Patient also followed by advanced heart failure team, status post Medtronic ICD in place  Was evaluated in the emergency department and [facility] outpatient office recommended to be admitted to inpatient cardiac telemetry  Work-up includes:  Chest x-ray: No acute abnormality within the chest  CRP 2.20  ESR 24  proBNP pending: 11,362  HS-Troponin 66.2  Admit to inpatient cardiac telemetry.  -- Continue with congestive heart failure protocol including with strict I's and O's, daily weight, low-salt sodium diet, there is high risk of noncompliance  --Per [facility] recommendation we will start on Bumex 3 mg IV x1 injection followed by Bumex 2 mg IV twice daily, monitor I's and O's  -- Monitor on telemetry, continue to trend troponins  -- [facility] cardiology already on board.  -- [facility] plan to device interrogation for ICD for possible arrhythmias, monitor electrolytes and replete as necessary  -- Continue Jardiance, metolazone 10 mg, metoprolol XL 12.5 mg daily and Diovan 20  -- Added proBNP    #Elevated inflammatory markers ESR and CRP and troponin with  pericarditis?   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  Blood sugar on arrival 141 seems to be well controlled  -- Hold oral medications in the hospital start patient on a long-acting insulin 80% of home dose and low-dose sliding scale 3 times daily and nightly.  -- Consistent carbohydrate diet     #Leukocytosis of 13 K without any fever or signs of infection likely reactive in nature or stable chronic CLL.   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     Chest x-ray without any pneumonia.    --Monitor CBC     #Hypertension, chronic and remains stable well-controlled . currently on metoprolol XL 12.5 mg, Diovan 20 mg, and IV BUMEX which will be replaced by IV Bumex during hospital stay.   -- Monitor blood pressure trend     #CKD stage III, creatinine baseline 1.6-1.8.  Today creatinine of 2.0 BUN of 46 with GFR of 34 anion gap of 10 without any metabolic acidosis.  -- Continue BUN and creatinine monitoring due to IV diuresis avoid nephrotoxic medications     #Coronary artery disease nonocclusive , last cardiac catheterization in 2014 negative . follows by [facility] as outpatient, currently on metoprolol, Lipitor, aspirin      #COPD and sleep apnea, Mild wheezing on exam and orthopnea but no shortness of breath , cough, currently on Breo and DuoNebs as needed will resume.      #Hyperlipidemia, currently on  Lipitor    #Thrombocytopenia, 118. No active bleeding on exam. Monitor CBC          Patient has BMI=Body mass index is 28.89 kg/m.  Diagnosis: Obesity based on BMI criteria        Recent Labs     09/04/22  1225   Platelets 118*     Diagnosis: Mild Thrombocytopenia         DVT Prophylaxis: Eliquis, SCDs  Central Line/Foley Catheter/PICC line status: None  Code Status: Full Code  Disposition:home  Type of  Admission:Inpatient   Estimated Length of Stay (including stay in the ER receiving treatment): > 2 days  Milestones required for discharge:   Acute on chronic congestive heart failure, systolic, due to noncompliant with medications including diuretics as outpatient      Clinical Information and History:   Chief Complaint:  Chief Complaint   Patient presents with    Shortness of Breath           Past Medical History:  Past Medical History:   Diagnosis Date    Acute CHF     NOV  & DEC 2018, - DIALYSIS CATH PLACED Aug 24 2017 TO REMOVE FLUID     Acute systolic (congestive) heart failure 08/2017    Anxiety     CAD (coronary artery disease)     Cardiomyopathy     nonischemic    CHF (congestive heart failure) 08/12/2014, 2013    Chronic obstructive pulmonary disease     POSSIBLE PER PT HE USES  SYNBICORT BID ABD  BREO INHALER PRN    Coronary artery disease 2011    CORONARY STENT PLACEMENT FOLLOWED BY [facility]    Depression     echocardiogram 02/2016, 11/2016, 09/2017, 12/20/2017    GERD (gastroesophageal reflux disease)     Heart attack 2011    and 2014    Hyperlipemia     Hypertension     ICD (implantable 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     PLACED 2014  [facility]  LAST INTERROGATION April 01 2018 REPORT REQUESTED    Ischemic cardiomyopathy     EF 15% ON ECHO 09-20-2017 DR GARG IN EPIC    Nonischemic cardiomyopathy     NSTEMI (non-ST elevated myocardial infarction)     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    Pacemaker 2014    MEDTRONIC ICD/PACEMAKER COMBO LAST INTERROGATION  12-12 2018    Pneumonia 09/2016    Primary cardiomyopathy     Ischemic cardiomyopathy EF 15% ON ECHO 09-20-2017 DR GARG IN EPIC    Sepsis 08/2017    Syncope and collapse     PRIOR TO ICD/PACEMAKER PLACED 2014    Type 2 diabetes mellitus, controlled DX 1998    BS  AVG 100  A1C 7.4 Dec 27 2017    Wheeze     PT SEE HIS PMD April 01 2018 RE THIS-TOLD TO SEE PMD BY [facility] JUNE 12 WHEN THEY SAW HIM FOR A CL     Past Surgical History:  Past Surgical History:   Procedure  Laterality Date    BIV  11/10/2016    ICD METRONIC  UPGRADE     CARDIAC CATHETERIZATION  03/2010    CORONARY STENT PLACEMENT X 2 PER PT; LM normal, LAD 70-80% distal lesion at apex, 20% lesion mid CFX    CARDIAC CATHETERIZATION  12/2012    CARDIAC DEFIBRILLATOR PLACEMENT  12/2012    [facility]    CARDIAC PACEMAKER PLACEMENT  2014  MEDTRONIC ICD/PACEMAKER PLACED [facility]    CIRCUMCISION  AGE 44    COLONOSCOPY  2009    CORRECTION HAMMER TOE      duodenal ulcer  1973    STRESS RELATED IN SCHOOL    ECHOCARDIOGRAM, TRANSTHORACIC  02/2016 11/2016 09/2017 12/2017    ECHOCARDIOGRAM, TRANSTHORACIC  02/2016,11/2016,12/20/2017    EGD N/A 08/15/2014    Procedure: EGD;  Surgeon: Colon Branch, MD;  Location: [facility];  Service: Gastroenterology;  Laterality: N/A;  egd w/ bx    EGD  1975    EGD, COLONOSCOPY N/A 04/04/2018    Procedure: EGD with bxs, COLONOSCOPY with polypectomy and clipping;  Surgeon: Doyne Keel, MD;  Location: [facility];  Service: Gastroenterology;  Laterality: N/A;    FRACTURE SURGERY  AGE 18     RT  ANKLE- CAST APPLIED    HERNIA REPAIR  AGE 44    LEFT INGUINAL HERNIA    MPI nuclear study  06/2016    orthopedic surgery  AGE 72    RT foot corrective - HAMMERTOE    TONSILLECTOMY AND ADENOIDECTOMY  1954     Family History:  Family History   Problem Relation Age of Onset    Breast cancer Mother     Heart attack Mother 39    Hypertension Mother     Diabetes Mother     Coronary artery disease Mother     Diabetes Brother     Heart attack Father     Hypertension Father     Reviewed  Social History:  Social History     Substance and Sexual Activity   Alcohol Use Not Currently     Social History     Substance and Sexual Activity   Drug Use No     Social History     Tobacco Use   Smoking Status Never   Smokeless Tobacco Never     Social History     Socioeconomic History    Marital status: Married     Spouse name: Teacher, music    Number of children: 0    Years of education: None    Highest education  level: None   Occupational History    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, history    Tobacco Use    Smoking status: Never    Smokeless tobacco: Never   Vaping Use    Vaping Use: Never used   Substance and Sexual Activity    Alcohol use: Not Currently    Drug use: No    Sexual activity: Yes     Partners: Female   Other Topics Concern    None   Social History Narrative    None     Social Determinants of Health     Financial Resource Strain: Not on file   Food Insecurity: No Food Insecurity (09/04/2022)    Hunger Vital Sign     Worried About Running Out of Food in the Last Year: Never true     Ran Out of Food in the Last Year: Never true   Transportation Needs: Not on file   Physical Activity: Not on file   Stress: Not on file   Social Connections: Not on file   Intimate Partner Violence: Not At Risk (09/04/2022)    Humiliation, Afraid, Rape, and Kick questionnaire     Fear of Current or Ex-Partner: No     Emotionally Abused: No     Physically Abused: No     Sexually  Abused: No   Housing Stability: Not on file     Allergies:  Allergies   Allergen Reactions    Allopurinol      Medications:(Not in a hospital admission)    Clinical Presentation: HPI:   [name] [name] is a 76 y.o. male who has history of   Past Surgical History:   Procedure Laterality Date    BIV  11/10/2016    ICD METRONIC  UPGRADE     CARDIAC CATHETERIZATION  03/2010    CORONARY STENT PLACEMENT X 2 PER PT; LM normal, LAD 70-80% distal lesion at apex, 20% lesion mid CFX    CARDIAC CATHETERIZATION  12/2012    CARDIAC DEFIBRILLATOR PLACEMENT  12/2012    [facility]    CARDIAC PACEMAKER PLACEMENT  2014    MEDTRONIC ICD/PACEMAKER PLACED [facility]    CIRCUMCISION  AGE 31    COLONOSCOPY  2009    CORRECTION HAMMER TOE      duodenal ulcer  1973    STRESS RELATED IN SCHOOL    ECHOCARDIOGRAM, TRANSTHORACIC  02/2016 11/2016 09/2017 12/2017    ECHOCARDIOGRAM, TRANSTHORACIC  02/2016,11/2016,12/20/2017    EGD N/A 08/15/2014    Procedure: EGD;  Surgeon: Colon Branch, MD;   Location: [facility];  Service: Gastroenterology;  Laterality: N/A;  egd w/ bx    EGD  1975    EGD, COLONOSCOPY N/A 04/04/2018    Procedure: EGD with bxs, COLONOSCOPY with polypectomy and clipping;  Surgeon: Doyne Keel, MD;  Location: [facility];  Service: Gastroenterology;  Laterality: N/A;    FRACTURE SURGERY  AGE 68     RT  ANKLE- CAST APPLIED    HERNIA REPAIR  AGE 31    LEFT INGUINAL HERNIA    MPI nuclear study  06/2016    orthopedic surgery  AGE 15    RT foot corrective - HAMMERTOE    TONSILLECTOMY AND ADENOIDECTOMY  1954      Past Medical History:   Diagnosis Date    Acute CHF     NOV  & DEC 2018, - DIALYSIS CATH PLACED Aug 24 2017 TO REMOVE FLUID     Acute systolic (congestive) heart failure 08/2017    Anxiety     CAD (coronary artery disease)     Cardiomyopathy     nonischemic    CHF (congestive heart failure) 08/12/2014, 2013    Chronic obstructive pulmonary disease     POSSIBLE PER PT HE USES  SYNBICORT BID ABD  BREO INHALER PRN    Coronary artery disease 2011    CORONARY STENT PLACEMENT FOLLOWED BY [facility]    Depression     echocardiogram 02/2016, 11/2016, 09/2017, 12/20/2017    GERD (gastroesophageal reflux disease)     Heart attack 2011    and 2014    Hyperlipemia     Hypertension     ICD (implantable 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     PLACED 2014  [facility]  LAST INTERROGATION April 01 2018 REPORT REQUESTED    Ischemic cardiomyopathy     EF 15% ON ECHO 09-20-2017 DR GARG IN EPIC    Nonischemic cardiomyopathy     NSTEMI (non-ST elevated myocardial infarction)     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    Pacemaker 2014    MEDTRONIC ICD/PACEMAKER COMBO LAST INTERROGATION  12-12 2018    Pneumonia 09/2016    Primary cardiomyopathy     Ischemic cardiomyopathy EF 15% ON ECHO 09-20-2017 DR GARG IN EP[facility]  Sepsis 08/2017    Syncope and collapse     PRIOR TO ICD/PACEMAKER PLACED 2014    Type 2 diabetes mellitus, controlled DX 1998    BS  AVG 100  A1C 7.4 Dec 27 2017    Wheeze     PT SEE HIS PMD April 01 2018 RE THIS-TOLD TO SEE PMD BY [facility] JUNE 12 WHEN THEY SAW HIM FOR A CL    came with   the chief complaint of Shortness of Breath      [name] [name] is a 76 year old male with prior history as significant as above presented to the [facility] office today for follow-up appointment where he was found with a weight gain of about 8 pounds, shortness of breath and wheezing patient torsemide on the last visit was increased to 60 mg twice daily and metolazone 10 mg however patient had progressive weight gain due to noncompliance per his wife he has not been taking his medication as prescribed sometimes he drives his car in the hallways and he skipped his medication for frequent urination side effects, he also followed by Dr. Renaee Munda nephrologist he also reports that he has not been compliant with some of the other medication taking.  He is shortness of breath, lower extremity nonpitting edema, relatively looking into decompensated congestive heart failure.  Reports heavy on his chest but denies of any acute left-sided chest pain radiation to the arm back or neck.      Review of Systems:   Chief Complaint:  Shortness of Breath    Review of Systems   Respiratory:  Positive for shortness of breath and wheezing.    Cardiovascular:  Positive for orthopnea, leg swelling and PND.   Psychiatric/Behavioral:  Positive for depression.    All other systems reviewed and are negative.          Physical Exam:     Vitals:    09/04/22 1258 09/04/22 1328 09/04/22 1353 09/04/22 1526   BP: 117/83 104/81 114/84 120/77   Pulse: 93 90 94 89   Resp: (!) 23 14 (!) 24 22   Temp:    97.8 F (36.6 C)   TempSrc:    Temporal   SpO2: 95% 97% 95% (!) 79%   Weight:       Height:         Physical Exam:   Physical Exam  Constitutional:       Appearance: Normal appearance.   Cardiovascular:      Comments: ICD  Pulmonary:      Effort: Pulmonary effort is normal.      Breath sounds: Wheezing present.   Musculoskeletal:      Right lower leg: Edema  present.      Left lower leg: Edema present.   Neurological:      General: No focal deficit present.      Mental Status: He is alert and oriented to person, place, and time. Mental status is at baseline.   Psychiatric:         Mood and Affect: Mood normal.         Behavior: Behavior normal.         Thought Content: Thought content normal.         Judgment: Judgment normal.       Results of Labs/imaging:   Labs have been reviewed:   Coagulation Profile:   Recent Labs   Lab 09/04/22  1257   PT 17.1*   PT INR  1.5*   PTT 34       CBC review:   Recent Labs   Lab 09/04/22  1225   WBC 13.15*   Hgb 12.6   Hematocrit 39.1   Platelets 118*   MCV 102.6*   RDW 17*   Segmented Neutrophils 14     Chem Review:  Recent Labs   Lab 09/04/22  1225   Sodium 140   Potassium 4.5   Chloride 109   CO2 21   BUN 46.0*   Creatinine 2.0*   Glucose 141*   Calcium 9.5   Magnesium 2.3   Phosphorus 4.0   Bilirubin, Total 2.4*   AST (SGOT) 16   ALT 14   Alkaline Phosphatase 69     Results       Procedure Component Value Units Date/Time    NT-proBNP [[id number]]  (Abnormal) Collected: 09/04/22 1225     Updated: 09/04/22 1535     NT-proBNP 11,362 pg/mL     Urinalysis Reflex to Microscopic Exam- Reflex to Culture [[id number]]  (Abnormal) Collected: 09/04/22 1430    Specimen: Urine, Clean Catch Updated: 09/04/22 1508     Urine Type Urine, Clean Ca     Color, UA Yellow     Clarity, UA Clear     Specific Gravity UA 1.019     Urine pH 6.0     Leukocyte Esterase, UA Negative     Nitrite, UA Negative     Protein, UR 100= 2+     Glucose, UA 1000= 4+     Ketones UA Negative     Urobilinogen, UA Normal mg/dL      Bilirubin, UA Negative     Blood, UA Negative     RBC, UA 0-2 /hpf      WBC, UA 0-5 /hpf      Squamous Epithelial Cells, Urine 0-5 /hpf      Hyaline Casts, UA 0-5 /lpf     CBC and differential [[id number]]  (Abnormal) Collected: 09/04/22 1225    Specimen: Blood Updated: 09/04/22 1426     WBC 13.15 x10 3/uL      Hgb 12.6 g/dL      Hematocrit 96.2 %       Platelets 118 x10 3/uL      RBC 3.81 x10 6/uL      MCV 102.6 fL      MCH 33.1 pg      MCHC 32.2 g/dL      RDW 17 %      MPV 13.0 fL      Instrument Absolute Neutrophil Count 2.48 x10 3/uL      Nucleated RBC 0.2 /100 WBC      Absolute NRBC 0.03 x10 3/uL     Manual Differential [[id number]]  (Abnormal) Collected: 09/04/22 1225     Updated: 09/04/22 1426     Segmented Neutrophils 14 %      Band Neutrophils 0 %      Lymphocytes Manual 84 %      Monocytes Manual 1 %      Eosinophils Manual 1 %      Basophils Manual 0 %      Neutrophils Absolute Manual 1.84 x10 3/uL      Band Neutrophils Absolute 0.00 x10 3/uL      Lymphocytes Absolute Manual 11.05 x10 3/uL      Monocytes Absolute 0.13 x10 3/uL      Eosinophils Absolute Manual 0.13 x10 3/uL      Basophils Absolute  Manual 0.00 x10 3/uL     Cell MorpHology [[id number]]  (Abnormal) Collected: 09/04/22 1225     Updated: 09/04/22 1426     Cell Morphology Normal     Platelet Estimate Decreased    High Sensitivity Troponin-I at 0 hrs [[id number]]  (Abnormal) Collected: 09/04/22 1225    Specimen: Blood Updated: 09/04/22 1348     hs Troponin-I 66.2 ng/L     COVID-19 (SARS-CoV-2) and Influenza A/B & RSV (Cepheid)- Age less than 5 years [[id number]] Collected: 09/04/22 1225    Specimen: Nasopharyngeal Swab Updated: 09/04/22 1325     Purpose of COVID testing Diagnostic -PUI     SARS-CoV-2 Specimen Source Nasal Swab     SARS CoV 2 Overall Result Negative     Influenza A Negative     Influenza B Negative     Respiratory Syncytial Virus Negative    Narrative:      o Collect and clearly label specimen type:  o PREFERRED-Upper respiratory specimen: One Nasal Swab in  Transport Media.  o Hand deliver to laboratory ASAP  Diagnostic -[facility]I    PT/APTT [[id number]]  (Abnormal) Collected: 09/04/22 1257     Updated: 09/04/22 1318     PT 17.1 sec      PT INR 1.5     PTT 34 sec     Sedimentation rate (ESR) [[id number]]  (Abnormal) Collected: 09/04/22 1225    Specimen: Blood Updated: 09/04/22 1306     Sed  Rate 24 mm/Hr     Comprehensive metabolic panel [[id number]]  (Abnormal) Collected: 09/04/22 1225     Updated: 09/04/22 1305     Glucose 141 mg/dL      BUN 53.6 mg/dL      Creatinine 2.0 mg/dL      Sodium 644 mEq/L      Potassium 4.5 mEq/L      Chloride 109 mEq/L      CO2 21 mEq/L      Calcium 9.5 mg/dL      Protein, Total 6.9 g/dL      Albumin 3.9 g/dL      AST (SGOT) 16 U/L      ALT 14 U/L      Alkaline Phosphatase 69 U/L      Bilirubin, Total 2.4 mg/dL      Globulin 3.0 g/dL      Albumin/Globulin Ratio 1.3     Anion Gap 10.0     eGFR 34.0 mL/min/1.73 m2     Magnesium [[id number]] Collected: 09/04/22 1225     Updated: 09/04/22 1305     Magnesium 2.3 mg/dL     CRP, High Sensitive [[id number]]  (Abnormal) Collected: 09/04/22 1225     Updated: 09/04/22 1305     C-Reactive Protein, High Sensitive 2.20 mg/dL     Phosphorus [[id number]] Collected: 09/04/22 1225    Specimen: Blood Updated: 09/04/22 1305     Phosphorus 4.0 mg/dL           Radiology reports have been reviewed:  Radiology Results (24 Hour)       Procedure Component Value Units Date/Time    Chest AP Portable [[id number]] Collected: 09/04/22 1250    Order Status: Completed Updated: 09/04/22 1253    Narrative:      HISTORY: Chest Pain     COMPARISON: 03/12/2022    FINDINGS:   Left chest pacemaker/defibrillator. The lungs are clear. No pleural  effusion or pneumothorax. Stable enlarged cardiac silhouette. No acute  osseous abnormality. Bone island in the anterolateral  left sixth rib.        Impression:         No acute abnormality within the chest.       August Albino  09/04/2022 12:51 PM          Chest AP Portable    Result Date: 09/04/2022  HISTORY: Chest Pain COMPARISON: 03/12/2022 FINDINGS: Left chest pacemaker/defibrillator. The lungs are clear. No pleural effusion or pneumothorax. Stable enlarged cardiac silhouette. No acute osseous abnormality. Bone island in the anterolateral left sixth rib.      No acute abnormality within the chest.  August Albino 09/04/2022 12:51  PM    Pathology:   Specimens (From admission, onward)      None          EKG: EKG reviewed   Last EKG Result       Procedure Component Value Units Date/Time    ECG 12 lead [[id number]] Collected: 09/04/22 1148     Updated: 09/04/22 1354     Ventricular Rate 89 BPM      Atrial Rate 89 BPM      P-R Interval 150 ms      QRS Duration 94 ms      Q-T Interval 416 ms      QTC Calculation (Bezet) 506 ms      P Axis 69 degrees      R Axis -5 degrees      T Axis 102 degrees      [facility] MUSE NARRATIVE AND IMPRESSION --     Atrial-sensed ventricular-paced rhythm OCCASIONAL PREMATURE VENTRICULAR COMPLEXES  ABNORMAL ECG  WHEN COMPARED WITH ECG OF 11-Mar-2022 23:07,  PREMATURE VENTRICULAR COMPLEXES ARE NOW PRESENT  VENTRICULAR RATE HAS INCREASED BY  12 BPM  Confirmed by Crimm, [place] [phone]) on 09/04/2022 1:53:27 PM      Narrative:      Atrial-sensed ventricular-paced rhythm OCCASIONAL PREMATURE VENTRICULAR COMPLEXES  ABNORMAL ECG  WHEN COMPARED WITH ECG OF 11-Mar-2022 23:07,  PREMATURE VENTRICULAR COMPLEXES ARE NOW PRESENT  VENTRICULAR RATE HAS INCREASED BY  12 BPM  Confirmed by Crimm, Rolley Sims [phone]) on 09/04/2022 1:53:27 PM            Hospitalist:   Signed by:   Diana Eves, NP  09/04/2022 3:41 PM  Time spent in evaluating and managing the care of the patient including face-to-face and non-face-to-face: >75 minutes    *This note was generated by the Epic EMR system/ Dragon speech recognition and may contain inherent errors or omissions not intended by the user. Grammatical errors, random word insertions, deletions, pronoun errors and incomplete sentences are occasional consequences of this technology due to software limitations. Not all errors are caught or corrected. If there are questions or concerns about the content of this note or information contained within the body of this dictation they should be addressed directly with the author for clarification.
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
## 2022-09-04 NOTE — Transition of Care (Signed)
4 eyes in 4 hours pressure injury assessment note:      Completed with: Doristine Johns ,RN  Unit & Time admitted: PCU 52 north @ 1600               Bony Prominences: Check appropriate box; if wound is present enter wound assessment in LDA     Occiput:                 [x] WNL  []  Wound present  Face:                     [x] WNL  []  Wound present  Ears:                      [x] WNL  []  Wound present  Spine:                    [x] WNL  []  Wound present  Shoulders:             [x] WNL  []  Wound present  Elbows:                  [x] WNL  []  Wound present  Sacrum/coccyx:     [x] WNL  []  Wound present  Ischial Tuberosity:  [x] WNL  []  Wound present  Trochanter/Hip:      [x] WNL  []  Wound present  Knees:                   [x] WNL  []  Wound present  Ankles:                   [x] WNL  []  Wound present  Heels:                    [x] WNL  []  Wound present  Other pressure areas:  []  Wound location       Device related: []  Device name:         LDA completed if wound present: yes  Consult WOCN if necessary     Other skin related issues, ie tears, rash, etc, document in Integumentary flowsheet

## 2022-09-05 DIAGNOSIS — I509 Heart failure, unspecified: Secondary | ICD-10-CM

## 2022-09-05 LAB — CELL MORPHOLOGY
Cell Morphology: NORMAL
Platelet Estimate: DECREASED — AB

## 2022-09-05 LAB — ECG 12-LEAD
Atrial Rate: 76 {beats}/min
P Axis: 72 degrees
P-R Interval: 160 ms
Q-T Interval: 442 ms
QRS Duration: 88 ms
QTC Calculation (Bezet): 497 ms
R Axis: 14 degrees
T Axis: 109 degrees
Ventricular Rate: 76 {beats}/min

## 2022-09-05 LAB — CBC AND DIFFERENTIAL
Absolute NRBC: 0.05 10*3/uL — ABNORMAL HIGH (ref 0.00–0.00)
Basophils Absolute Automated: 0.02 10*3/uL (ref 0.00–0.08)
Basophils Automated: 0.1 %
Eosinophils Absolute Automated: 0.12 10*3/uL (ref 0.00–0.44)
Eosinophils Automated: 0.8 %
Hematocrit: 38.5 % (ref 37.6–49.6)
Hgb: 12.2 g/dL — ABNORMAL LOW (ref 12.5–17.1)
Immature Granulocytes Absolute: 0.02 10*3/uL (ref 0.00–0.07)
Immature Granulocytes: 0.1 %
Instrument Absolute Neutrophil Count: 2.18 10*3/uL (ref 1.10–6.33)
Lymphocytes Absolute Automated: 12.39 10*3/uL — ABNORMAL HIGH (ref 0.42–3.22)
Lymphocytes Automated: 83.4 %
MCH: 33.6 pg — ABNORMAL HIGH (ref 25.1–33.5)
MCHC: 31.7 g/dL (ref 31.5–35.8)
MCV: 106.1 fL — ABNORMAL HIGH (ref 78.0–96.0)
MPV: 13.1 fL — ABNORMAL HIGH (ref 8.9–12.5)
Monocytes Absolute Automated: 0.13 10*3/uL — ABNORMAL LOW (ref 0.21–0.85)
Monocytes: 0.9 %
Neutrophils Absolute: 2.18 10*3/uL (ref 1.10–6.33)
Neutrophils: 14.7 %
Nucleated RBC: 0.3 /100 WBC — ABNORMAL HIGH (ref 0.0–0.0)
Platelets: 118 10*3/uL — ABNORMAL LOW (ref 142–346)
RBC: 3.63 10*6/uL — ABNORMAL LOW (ref 4.20–5.90)
RDW: 17 % — ABNORMAL HIGH (ref 11–15)
WBC: 14.86 10*3/uL — ABNORMAL HIGH (ref 3.10–9.50)

## 2022-09-05 LAB — GLUCOSE WHOLE BLOOD - POCT
Whole Blood Glucose POCT: 123 mg/dL — ABNORMAL HIGH (ref 70–100)
Whole Blood Glucose POCT: 186 mg/dL — ABNORMAL HIGH (ref 70–100)
Whole Blood Glucose POCT: 207 mg/dL — ABNORMAL HIGH (ref 70–100)
Whole Blood Glucose POCT: 203 mg/dL — ABNORMAL HIGH (ref 70–100)

## 2022-09-05 LAB — BASIC METABOLIC PANEL
Anion Gap: 12 (ref 5.0–15.0)
BUN: 51 mg/dL — ABNORMAL HIGH (ref 9.0–28.0)
CO2: 22 mEq/L (ref 17–29)
Calcium: 9.4 mg/dL (ref 7.9–10.2)
Chloride: 106 mEq/L (ref 99–111)
Creatinine: 2.3 mg/dL — ABNORMAL HIGH (ref 0.5–1.5)
Glucose: 165 mg/dL — ABNORMAL HIGH (ref 70–100)
Potassium: 4.5 mEq/L (ref 3.5–5.3)
Sodium: 140 mEq/L (ref 135–145)
eGFR: 28.8 mL/min/{1.73_m2} — AB (ref >=60)

## 2022-09-05 NOTE — Plan of Care (Signed)
Problem: Moderate/High Fall Risk Score >5  Goal: Patient will remain free of falls  Flowsheets (Taken 09/05/2022 0813)  High (Greater than 13):   MOD-Use of assistive devices -Bedside Commode if appropriate   MOD-Utilize diversion activities   MOD-Perform dangle, stand, walk (DSW) prior to mobilization   MOD-Request PT/OT consult order for patients with gait/mobility impairment   HIGH-Utilize chair pad alarm for patient while in the chair   HIGH-Bed alarm on at all times while patient in bed   HIGH-Apply yellow "Fall Risk" arm band   HIGH-Pharmacy to initiate evaluation and intervention per protocol   HIGH-Initiate use of floor mats as appropriate   HIGH-Consider use of low bed     Problem: Everyday - Heart Failure  Goal: Stable Vital Signs and Fluid Balance  Flowsheets (Taken 09/04/2022 2002 by Nickolas Madrid, RN)  Stable Vital Signs and Fluid Balance:   Fluid Restriction   Assess for swelling/edema   Strict Intake/Output   Wean oxygen as needed if appropriate   Monitor labs and report abnormalities to physician   Monitor, assess vital signs and telemetry per policy   Daily Standing Weights in the morning using the same scale, after using the bathroom and before breadfast.  If unable to stand, zero the bed and use the bed scale  Goal: Mobility/Activity is Maintained at Optimal Level for Patient  Flowsheets (Taken 09/04/2022 1721)  Mobility/Activity is Maintained at Optimal Level for Patient:   Increase mobility as tolerated/progressive mobility protocol   Maintain SCD's as Ordered   Perform active/passive ROM   Reposition patient every 2 hours and as needed unless able to reposition self   Assess for changes in respiratory status, level of consciousness and/or development of fatigue  Goal: Nutritional Intake is Adequate  Flowsheets (Taken 09/05/2022 0812)  Nutritional Intake is Adequate:   Cardiac diet-2 gm Sodium   Fluid Restricction if needed   Consult/Collaborate with Nutritionist   Patient and family  teaching on low sodium diet   Assess appetite,anorexia and amount of meal/food tolerated   Encourage/perform oral hygiene as appropriate  Goal: Teaching-Using CHF Warning Zones and Educational Videos  Flowsheets (Taken 09/04/2022 1722)  Teaching-Using CHF Warning Zones and Educational Videos:   Signs & Symptoms of CHF   Daily Standing Weights & record   CHF Warning Zones and when to call for help   Sodium Restriction   Fluid Restriction if appropriate   Document in the Education Tab in EPIC with Teach-back     Problem: Everyday - Heart Failure  Goal: Mobility/Activity is Maintained at Optimal Level for Patient  Flowsheets (Taken 09/04/2022 1721)  Mobility/Activity is Maintained at Optimal Level for Patient:   Increase mobility as tolerated/progressive mobility protocol   Maintain SCD's as Ordered   Perform active/passive ROM   Reposition patient every 2 hours and as needed unless able to reposition self   Assess for changes in respiratory status, level of consciousness and/or development of fatigue     Problem: Everyday - Heart Failure  Goal: Teaching-Using CHF Warning Zones and Educational Videos  Flowsheets (Taken 09/04/2022 1722)  Teaching-Using CHF Warning Zones and Educational Videos:   Signs & Symptoms of CHF   Daily Standing Weights & record   CHF Warning Zones and when to call for help   Sodium Restriction   Fluid Restriction if appropriate   Document in the Education Tab in EPIC with Teach-back     Problem: Everyday - Heart Failure  Goal: Nutritional Intake is Adequate  Flowsheets (  Taken 09/05/2022 0812)  Nutritional Intake is Adequate:   Cardiac diet-2 gm Sodium   Fluid Restricction if needed   Consult/Collaborate with Nutritionist   Patient and family teaching on low sodium diet   Assess appetite,anorexia and amount of meal/food tolerated   Encourage/perform oral hygiene as appropriate

## 2022-09-05 NOTE — Progress Notes (Signed)
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     #Acute on chronic systolic congestive heart failure exacerbation   Endorses around 8 pound weight gain, last echocardiogram 06/07/2021 revealed EF of 25 to 30% moderate tricuspid and mitral regurgitation  -Chest x-ray: No acute abnormality within the chest, elevated BNP  - strict I's and O's, daily weight, low-salt sodium diet, there is high risk of noncompliance  --Bumex 2 mg IV twice daily, monitor I's and O's  -- Monitor on telemetry, continue to trend troponins  -- [facility] cardiology already on board.  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  Blood sugar on arrival 141 seems to be well controlled  -- Hold oral medications, decrease Lantus, SSI     #Leukocytosis of 13 K without any fever or signs of infection likely reactive in nature or stable chronic CLL.   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     Chest x-ray without any pneumonia.    --Monitor CBC     #Hypertension, chronic and  remains stable well-controlled . currently on metoprolol XL 12.5 mg, Diovan 20 mg, and IV BUMEX which will be replaced by IV Bumex during hospital stay.   -- Monitor blood pressure trend     #CKD stage III, creatinine baseline 1.6-1.8.  Today creatinine of 2.3   -- Monitor daily with diuresis     #Coronary artery disease nonocclusive , last cardiac catheterization in 2014 negative . follows by [facility] as outpatient, currently on metoprolol, Lipitor, aspirin      #COPD and sleep apnea, stable, continue inhalers     #Hyperlipidemia, currently on Lipitor     #Thrombocytopenia, overall stable, monitor  #History of DVT: Eliquis      DVT Prophylaxis: Eliquis  Central Line/Foley Catheter/PICC line status: None  Code Status: Full Code  Disposition:Planning to discharge patient to home  Type of Admission:Inpatient  Hospital Problems:     Active Hospital Problems    Diagnosis    Acute on chronic right-sided heart failure     Subjective:   Chief Complaint:  Shortness of Breath  Breathing overall is better, cough is improved, denies abdominal pain, nausea, vomiting, weaned off oxygen  09/05/22   Review of Systems   Constitutional:  Negative for chills and fever.   Respiratory:  Positive for cough and shortness of breath. Negative for hemoptysis.    Cardiovascular:  Positive for leg swelling. Negative for chest pain.   Gastrointestinal:  Negative for abdominal pain, blood in stool, nausea and vomiting.   Neurological:  Negative for dizziness, focal weakness and weakness.     Objective:     Vitals:    09/05/22 0815 09/05/22 0930 09/05/22 0935 09/05/22 1013   BP: 99/80 95/77  96/74   Pulse: 73 68 71 73   Resp:       Temp:       TempSrc:       SpO2: 95% 97% 98% 95%   Weight:       Height:         Physical Exam:   Physical Exam  Constitutional:       Appearance: Normal appearance.   HENT:      Head: Normocephalic and atraumatic.   Eyes:      Extraocular Movements: Extraocular movements intact.   Cardiovascular:      Rate and  Rhythm: Normal rate and regular rhythm.      Heart sounds: Murmur heard.      Comments: Left upper chest device palpable  Pulmonary:      Effort: No respiratory distress.      Breath sounds: No wheezing.      Comments: Mild scattered crackles with good air entry without wheezing or rhonchi  Abdominal:      General: Abdomen is flat.      Palpations: Abdomen is soft.      Tenderness: There is no abdominal tenderness. There is no guarding or rebound.   Musculoskeletal:      Cervical back: Neck supple.      Right lower leg: Edema present.      Left lower leg: Edema present.   Skin:     Findings: No rash.   Neurological:      Mental Status: He is alert.      Comments: Non focal grossly    Psychiatric:         Mood and Affect: Mood normal.       Current Medications      Scheduled Meds: PRN Meds:    apixaban, 5 mg, Oral, Q12H SCH  aspirin EC, 81 mg, Oral, Daily  atorvastatin, 40 mg, Oral, QHS  bumetanide, 2 mg, Intravenous, BID  empagliflozin, 10 mg, Oral, Daily  fluticasone furoate-vilanterol, 1 puff, Inhalation, QAM  insulin glargine, 40 Units, Subcutaneous, QHS  insulin lispro, 1-3 Units, Subcutaneous, QHS  insulin lispro, 1-5 Units, Subcutaneous, TID AC  metOLazone, 10 mg, Oral, Daily  metoprolol succinate XL, 12.5 mg, Oral, Daily  valsartan, 20 mg, Oral, Daily        Continuous Infusions:   albuterol-ipratropium, 3 mL, Q6H PRN  dextrose, 15 g of glucose, PRN   Or  dextrose, 12.5 g, PRN   Or  dextrose, 12.5 g, PRN   Or  glucagon (rDNA), 1 mg, PRN          Results of Labs/imaging   Labs for the last 24 hours have been reviewed:   Coagulation Profile:   Recent Labs   Lab 09/04/22  1257   PT 17.1*   PT INR 1.5*   PTT 34       CBC review:   Recent Labs   Lab 09/05/22  0758 09/04/22  1225   WBC 14.86* 13.15*   Hgb 12.2* 12.6   Hematocrit 38.5 39.1   Platelets 118* 118*   MCV 106.1* 102.6*   RDW 17* 17*   Neutrophils 14.7  --    Segmented Neutrophils  --  14   Neutrophils Absolute 2.18  --    Lymphocytes Automated 83.4  --  Eosinophils Automated 0.8  --    Immature Granulocytes 0.1  --    Immature Granulocytes Absolute 0.02  --      Chem Review:  Recent Labs   Lab 09/05/22  0758 09/04/22  1225   Sodium 140 140   Potassium 4.5 4.5   Chloride 106 109   CO2 22 21   BUN 51.0* 46.0*   Creatinine 2.3* 2.0*   Glucose 165* 141*   Calcium 9.4 9.5   Magnesium  --  2.3   Phosphorus  --  4.0   Bilirubin, Total  --  2.4*   AST (SGOT)  --  16   ALT  --  14   Alkaline Phosphatase  --  69     Results       Procedure Component Value Units Date/Time    CBC and differential [[id number]]  (Abnormal) Collected: 09/05/22 0758     Updated: 09/05/22 0939     WBC 14.86 x10 3/uL      Hgb 12.2 g/dL      Hematocrit 81.1 %      Platelets 118 x10 3/uL      RBC 3.63 x10 6/uL      MCV 106.1 fL      MCH 33.6 pg      MCHC 31.7 g/dL      RDW 17 %      MPV 13.1 fL      Instrument Absolute Neutrophil Count 2.18 x10 3/uL      Neutrophils 14.7 %      Lymphocytes Automated 83.4 %      Monocytes 0.9 %      Eosinophils Automated 0.8 %      Basophils Automated 0.1 %      Immature Granulocytes 0.1 %      Nucleated RBC 0.3 /100 WBC      Neutrophils Absolute 2.18 x10 3/uL      Lymphocytes Absolute Automated 12.39 x10 3/uL      Monocytes Absolute Automated 0.13 x10 3/uL      Eosinophils Absolute Automated 0.12 x10 3/uL      Basophils Absolute Automated 0.02 x10 3/uL      Immature Granulocytes Absolute 0.02 x10 3/uL      Absolute NRBC 0.05 x10 3/uL     Cell MorpHology [[id number]]  (Abnormal) Collected: 09/05/22 0758     Updated: 09/05/22 0939     Cell Morphology Normal     Platelet Estimate Decreased     Giant Platelets Present    Basic Metabolic Panel [[id number]]  (Abnormal) Collected: 09/05/22 0758    Specimen: Blood Updated: 09/05/22 0914     Glucose 165 mg/dL      BUN 36.4 mg/dL      Creatinine 2.3 mg/dL      Calcium 9.4 mg/dL      Sodium 680 mEq/L      Potassium 4.5 mEq/L      Chloride 106 mEq/L      CO2 22 mEq/L      Anion Gap 12.0     eGFR 28.8 mL/min/1.73 m2     Glucose  Whole Blood - POCT [[id number]]  (Abnormal) Collected: 09/05/22 0727     Updated: 09/05/22 0729     Whole Blood Glucose POCT 123 mg/dL     Glucose Whole Blood - POCT [[id number]]  (Abnormal) Collected: 09/04/22 2046     Updated: 09/04/22 2049     Whole Blood Glucose POCT 196 mg/dL  High Sensitivity Troponin-I at 2 hrs with calculated Delta [[id number]][[id number]]  (Abnormal) Collected: 09/04/22 1628    Specimen: Blood Updated: 09/04/22 1711     hs Troponin-I 58.9 ng/L      hs Troponin-I Delta -7.3 ng/L     Glucose Whole Blood - POCT [[id number]][[id number]]  (Abnormal) Collected: 09/04/22 1633     Updated: 09/04/22 1636     Whole Blood Glucose POCT 158 mg/dL     NT-proBNP [[id number]][[id number]]  (Abnormal) Collected: 09/04/22 1225     Updated: 09/04/22 1535     NT-proBNP 11,362 pg/mL     Urinalysis Reflex to Microscopic Exam- Reflex to Culture [[id number]][[id number]]  (Abnormal) Collected: 09/04/22 1430    Specimen: Urine, Clean Catch Updated: 09/04/22 1508     Urine Type Urine, Clean Ca     Color, UA Yellow     Clarity, UA Clear     Specific Gravity UA 1.019     Urine pH 6.0     Leukocyte Esterase, UA Negative     Nitrite, UA Negative     Protein, UR 100= 2+     Glucose, UA 1000= 4+     Ketones UA Negative     Urobilinogen, UA Normal mg/dL      Bilirubin, UA Negative     Blood, UA Negative     RBC, UA 0-2 /hpf      WBC, UA 0-5 /hpf      Squamous Epithelial Cells, Urine 0-5 /hpf      Hyaline Casts, UA 0-5 /lpf     CBC and differential [[id number]][[id number]]  (Abnormal) Collected: 09/04/22 1225    Specimen: Blood Updated: 09/04/22 1426     WBC 13.15 x10 3/uL      Hgb 12.6 g/dL      Hematocrit 36.639.1 %      Platelets 118 x10 3/uL      RBC 3.81 x10 6/uL      MCV 102.6 fL      MCH 33.1 pg      MCHC 32.2 g/dL      RDW 17 %      MPV 13.0 fL      Instrument Absolute Neutrophil Count 2.48 x10 3/uL      Nucleated RBC 0.2 /100 WBC      Absolute NRBC 0.03 x10 3/uL     Manual Differential [[id number]][[id number]]  (Abnormal) Collected: 09/04/22 1225     Updated: 09/04/22 1426     Segmented  Neutrophils 14 %      Band Neutrophils 0 %      Lymphocytes Manual 84 %      Monocytes Manual 1 %      Eosinophils Manual 1 %      Basophils Manual 0 %      Neutrophils Absolute Manual 1.84 x10 3/uL      Band Neutrophils Absolute 0.00 x10 3/uL      Lymphocytes Absolute Manual 11.05 x10 3/uL      Monocytes Absolute 0.13 x10 3/uL      Eosinophils Absolute Manual 0.13 x10 3/uL      Basophils Absolute Manual 0.00 x10 3/uL     Cell MorpHology [[id number]][[id number]]  (Abnormal) Collected: 09/04/22 1225     Updated: 09/04/22 1426     Cell Morphology Normal     Platelet Estimate Decreased    High Sensitivity Troponin-I at 0 hrs [[id number]][[id number]]  (Abnormal) Collected: 09/04/22 1225    Specimen: Blood Updated: 09/04/22 1348     hs Troponin-I 66.2 ng/L  COVID-19 (SARS-CoV-2) and Influenza A/B & RSV (Cepheid)- Age less than 5 years [[id number]] Collected: 09/04/22 1225    Specimen: Nasopharyngeal Swab Updated: 09/04/22 1325     Purpose of COVID testing Diagnostic -[facility]I     SARS-CoV-2 Specimen Source Nasal Swab     SARS CoV 2 Overall Result Negative     Influenza A Negative     Influenza B Negative     Respiratory Syncytial Virus Negative    Narrative:      o Collect and clearly label specimen type:  o PREFERRED-Upper respiratory specimen: One Nasal Swab in  Transport Media.  o Hand deliver to laboratory ASAP  Diagnostic -[facility]I    PT/APTT [[id number]]  (Abnormal) Collected: 09/04/22 1257     Updated: 09/04/22 1318     PT 17.1 sec      PT INR 1.5     PTT 34 sec     Sedimentation rate (ESR) [[id number]]  (Abnormal) Collected: 09/04/22 1225    Specimen: Blood Updated: 09/04/22 1306     Sed Rate 24 mm/Hr     Comprehensive metabolic panel [[id number]]  (Abnormal) Collected: 09/04/22 1225     Updated: 09/04/22 1305     Glucose 141 mg/dL      BUN 62.9 mg/dL      Creatinine 2.0 mg/dL      Sodium 528 mEq/L      Potassium 4.5 mEq/L      Chloride 109 mEq/L      CO2 21 mEq/L      Calcium 9.5 mg/dL      Protein, Total 6.9 g/dL      Albumin 3.9 g/dL      AST  (SGOT) 16 U/L      ALT 14 U/L      Alkaline Phosphatase 69 U/L      Bilirubin, Total 2.4 mg/dL      Globulin 3.0 g/dL      Albumin/Globulin Ratio 1.3     Anion Gap 10.0     eGFR 34.0 mL/min/1.73 m2     Magnesium [[id number]] Collected: 09/04/22 1225     Updated: 09/04/22 1305     Magnesium 2.3 mg/dL     CRP, High Sensitive [[id number]]  (Abnormal) Collected: 09/04/22 1225     Updated: 09/04/22 1305     C-Reactive Protein, High Sensitive 2.20 mg/dL     Phosphorus [[id number]] Collected: 09/04/22 1225    Specimen: Blood Updated: 09/04/22 1305     Phosphorus 4.0 mg/dL           Radiology reports have been reviewed:  Radiology Results (24 Hour)       Procedure Component Value Units Date/Time    Chest AP Portable [[id number]] Collected: 09/04/22 1250    Order Status: Completed Updated: 09/04/22 1253    Narrative:      HISTORY: Chest Pain     COMPARISON: 03/12/2022    FINDINGS:   Left chest pacemaker/defibrillator. The lungs are clear. No pleural  effusion or pneumothorax. Stable enlarged cardiac silhouette. No acute  osseous abnormality. Bone island in the anterolateral left sixth rib.        Impression:         No acute abnormality within the chest.       August Albino  09/04/2022 12:51 PM          Chest AP Portable    Result Date: 09/04/2022  HISTORY: Chest Pain COMPARISON: 03/12/2022 FINDINGS: Left chest pacemaker/defibrillator. The lungs are clear. No pleural effusion  or pneumothorax. Stable enlarged cardiac silhouette. No acute osseous abnormality. Bone island in the anterolateral left sixth rib.      No acute abnormality within the chest.  August Albino 09/04/2022 12:51 PM    Pathology:   Specimens (From admission, onward)      None          Last EKG Result       Procedure Component Value Units Date/Time    ECG 12 lead [[id number]] Collected: 09/04/22 1148     Updated: 09/04/22 1354     Ventricular Rate 89 BPM      Atrial Rate 89 BPM      P-R Interval 150 ms      QRS Duration 94 ms      Q-T Interval 416 ms      QTC Calculation  (Bezet) 506 ms      P Axis 69 degrees      R Axis -5 degrees      T Axis 102 degrees      [facility] MUSE NARRATIVE AND IMPRESSION --     Atrial-sensed ventricular-paced rhythm OCCASIONAL PREMATURE VENTRICULAR COMPLEXES  ABNORMAL ECG  WHEN COMPARED WITH ECG OF 11-Mar-2022 23:07,  PREMATURE VENTRICULAR COMPLEXES ARE NOW PRESENT  VENTRICULAR RATE HAS INCREASED BY  12 BPM  Confirmed by Crimm, [place] [id number]) on 09/04/2022 1:53:27 PM      Narrative:      Atrial-sensed ventricular-paced rhythm OCCASIONAL PREMATURE VENTRICULAR COMPLEXES  ABNORMAL ECG  WHEN COMPARED WITH ECG OF 11-Mar-2022 23:07,  PREMATURE VENTRICULAR COMPLEXES ARE NOW PRESENT  VENTRICULAR RATE HAS INCREASED BY  12 BPM  Confirmed by Crimm, Hampton ([id number]) on 09/04/2022 1:53:27 PM    ECG 12 lead (Electrocardiogram) [[id number]] Collected: 09/05/22 0535     Updated: 09/05/22 0536     Ventricular Rate 76 BPM      Atrial Rate 76 BPM      P-R Interval 160 ms      QRS Duration 88 ms      Q-T Interval 442 ms      QTC Calculation (Bezet) 497 ms      P Axis 72 degrees      R Axis 14 degrees      T Axis 109 degrees      IHS MUSE NARRATIVE AND IMPRESSION --     Atrial-sensed ventricular-paced rhythm  ABNORMAL ECG      Narrative:      Atrial-sensed ventricular-paced rhythm  ABNORMAL ECG    EKG SCAN [[id number]] Resulted: 09/04/22 2348     Updated: 09/04/22 2348          Hospitalist   Signed by:   Krystal Eaton, MD  09/05/2022 11:03 AM  Time spent in evaluating and managing the care of the patient including face-to-face and non-face-to-face: 35 minutes    *This note was generated by the Epic EMR system/ Dragon speech recognition and may contain inherent errors or omissions not intended by the user. Grammatical errors, random word insertions, deletions, pronoun errors and incomplete sentences are occasional consequences of this technology due to software limitations. Not all errors are caught or corrected. If there are questions or concerns about the content of this note or  information contained within the body of this dictation they should be addressed directly with the author for clarification

## 2022-09-05 NOTE — Consults (Signed)
Case Management Consult Note   [facility]  [facility] PROGRESSIVE CARE     Patient Name: FITZHUGH, VIZCARRONDO   Date of Birth [birth year]   Attending Physician: Krystal Eaton, MD   Primary Care Physician: Zorita Pang, MD   Length of Stay 1   Reason for Consult / Chief Complaint Shortness of Breath        Hospital Problem List:   Principal Problem:    Acute on chronic right-sided heart failure        Reason for Consult Request:                                                              CHF   CM Interventions:                                                              Consult received.  CM attempted to call patient's room phone and cell phone to complete initial case management assessment.  No answer to either phone and no option to leave a voicemail on cell phone.  CM will try again later.      CM will continue to follow for discharge planning and needs.           Neldon Labella. Brand Siever, BSN, RN, CCM  RN Case Manager II  [facility]    P: [phone]   F: [phone]  [email or web address]

## 2022-09-05 NOTE — Plan of Care (Signed)
Problem: Pain interferes with ability to perform ADL  Goal: Pain at adequate level as identified by patient  Outcome: Progressing  Flowsheets (Taken 09/04/2022 2002 by Nickolas Madrid, RN)  Pain at adequate level as identified by patient: Identify patient comfort function goal     Problem: Side Effects from Pain Analgesia  Goal: Patient will experience minimal side effects of analgesic therapy  Outcome: Progressing  Flowsheets (Taken 09/05/2022 2214)  Patient will experience minimal side effects of analgesic therapy:   Monitor/assess patient's respiratory status (RR depth, effort, breath sounds)   Prevent/manage side effects per LIP orders (i.e. nausea, vomiting, pruritus, constipation, urinary retention, etc.)   Evaluate for opioid-induced sedation with appropriate assessment tool (i.e. POSS)   Assess for changes in cognitive function     Problem: Moderate/High Fall Risk Score >5  Goal: Patient will remain free of falls  Outcome: Progressing  Flowsheets (Taken 09/05/2022 2045)  Moderate Risk (6-13):   MOD-Consider activation of bed alarm if appropriate   MOD-Floor mat at bedside (where available) if appropriate   MOD-Remain with patient during toileting   MOD-Perform dangle, stand, walk (DSW) prior to mobilization   MOD-include family in multidisciplinary POC discussions   LOW-Anticoagulation education for injury risk     Problem: Everyday - Heart Failure  Goal: Stable Vital Signs and Fluid Balance  Outcome: Progressing  Flowsheets (Taken 09/04/2022 2002 by Nickolas Madrid, RN)  Stable Vital Signs and Fluid Balance:   Fluid Restriction   Assess for swelling/edema   Strict Intake/Output   Wean oxygen as needed if appropriate   Monitor labs and report abnormalities to physician   Monitor, assess vital signs and telemetry per policy   Daily Standing Weights in the morning using the same scale, after using the bathroom and before breadfast.  If unable to stand, zero the bed and use the bed scale  Goal:  Mobility/Activity is Maintained at Optimal Level for Patient  Outcome: Progressing  Flowsheets (Taken 09/04/2022 1721 by Romeo Apple, RN)  Mobility/Activity is Maintained at Optimal Level for Patient:   Increase mobility as tolerated/progressive mobility protocol   Maintain SCD's as Ordered   Perform active/passive ROM   Reposition patient every 2 hours and as needed unless able to reposition self   Assess for changes in respiratory status, level of consciousness and/or development of fatigue  Goal: Nutritional Intake is Adequate  Outcome: Progressing  Flowsheets (Taken 09/05/2022 0812 by Romeo Apple, RN)  Nutritional Intake is Adequate:   Cardiac diet-2 gm Sodium   Fluid Restricction if needed   Consult/Collaborate with Nutritionist   Patient and family teaching on low sodium diet   Assess appetite,anorexia and amount of meal/food tolerated   Encourage/perform oral hygiene as appropriate  Goal: Teaching-Using CHF Warning Zones and Educational Videos  Outcome: Progressing  Flowsheets (Taken 09/04/2022 1722 by Romeo Apple, RN)  Teaching-Using CHF Warning Zones and Educational Videos:   Signs & Symptoms of CHF   Daily Standing Weights & record   CHF Warning Zones and when to call for help   Sodium Restriction   Fluid Restriction if appropriate   Document in the Education Tab in EPIC with Teach-back

## 2022-09-05 NOTE — Progress Notes (Addendum)
[facility] PROGRESS NOTE    [name] [address] PROGRESSIVE CARE  [address]  [place] [address]  Dept: [phone]  Loc: [phone]    Date Time: 09/05/22 7:58 AM  Patient Name: [name] [name] Day: 1    Subjective/Cardiac Relevant Events:   Neg 950 cc; wt down 4 lbs from yest  BP soft  Creat 2.3; K 4.5  Still with LE swelling and tachypnea    Pt denies SOB, CP, dizziness  LE swelling persists    ASSESSMENT:   Patient is a 76 y.o. male with the following relevant diagnoses:    Acute on chronic systolic CHF. +orthopnea, LE edema, and weight gain   EF 25 to 30% by echo August 2022.  Moderate tricuspid and mitral regurgitation present.  PA pressures were 54 mmHg.   CP, Elevated HS trop 66.2 and inflamm markers  ? 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       RECOMMENDATIONS:     Continue Eliquis 5 mg bid  Continue ASA and statin  On Bumex 2 mg IV bid- to continue to diurese; renal was following pt last admission- careful watch on creat  Metolazone 10 mg daily  Continue Valsartan 20 mg  daily  Continue Toprol 12.5 mg daily  Daily I/Os  Daily standing weights  device interrogation pending    Telemetry/Labs Reviewed/Intake-Output:     Telemetry reviewed: atrial sensed v paced; 69; 4 beat VT at 0549 this am    Recent Labs   Lab 09/04/22  1628 09/04/22  1225   hs Troponin-I 58.9* 66.2*   hs Troponin-I Delta -7.3  --              Recent Labs   Lab 09/04/22  1225   Bilirubin, Total 2.4*   Protein, Total 6.9   Albumin 3.9   ALT 14   AST (SGOT) 16     Recent Labs   Lab 09/04/22  1225   Magnesium 2.3      Recent Labs   Lab 09/04/22  1257   PT 17.1*   PT INR 1.5*   PTT 34     Recent Labs   Lab 09/04/22  1225   WBC 13.15*   Hgb 12.6   Hematocrit 39.1   Platelets 118*     Recent Labs   Lab 09/04/22  1225   Sodium 140   Potassium 4.5   Chloride 109   CO2 21   BUN 46.0*   Creatinine 2.0*   eGFR 34.0*   Glucose 141*   Calcium 9.5           Invalid input(s): "FREET4"    .  Lab Results   Component Value Date    BNP 11,362 (H) 09/04/2022          Intake/Output Summary (Last 24 hours) at 09/05/2022 0758  Last data filed at 09/05/2022 0608  Gross per 24 hour   Intake 750 ml   Output 1700 ml   Net -950 ml       Medications:  Scheduled Meds: PRN Meds:    apixaban, 5 mg, Oral, Q12H SCH  aspirin EC, 81 mg, Oral, Daily  atorvastatin, 40 mg, Oral, QHS  bumetanide, 2 mg, Intravenous, BID  empagliflozin, 10 mg, Oral, Daily  fluticasone furoate-vilanterol, 1 puff, Inhalation, QAM  insulin glargine, 40 Units, Subcutaneous, QHS  insulin lispro, 1-3 Units, Subcutaneous, QHS  insulin lispro, 1-5 Units, Subcutaneous, TID AC  metOLazone, 10 mg, Oral, Daily  metoprolol succinate XL, 12.5 mg, Oral, Daily  valsartan, 20 mg, Oral, Daily        Continuous Infusions:   albuterol-ipratropium, 3 mL, Q6H PRN  dextrose, 15 g of glucose, PRN   Or  dextrose, 12.5 g, PRN   Or  dextrose, 12.5 g, PRN   Or  glucagon (rDNA), 1 mg, PRN          Physical Exam:     Vitals:    09/04/22 2355 09/05/22 0404 09/05/22 0608 09/05/22 0749   BP: 99/79 100/75  97/76   Pulse: 82 76  75   Resp: 21 20  17    Temp: 97.4 F (36.3 C) 97.8 F (36.6 C)  97.2 F (36.2 C)   TempSrc: Temporal Temporal  Temporal   SpO2: 97% 98%  99%   Weight:   94.5 kg (208 lb 6.4 oz)    Height:             07/03/2022    10:51 AM 07/24/2022    10:23 AM 08/04/2022     4:18 PM 09/04/2022     9:54 AM 09/04/2022    11:40 AM 09/04/2022     5:42 PM 09/05/2022     6:08 AM   Weight Monitoring   Height 182.9 cm 182.9 cm  182.9 cm 182.9 cm 181 cm    Height Method     Stated     Weight 93.895 kg 91.627  kg 94.257 kg 96.616 kg 96.616 kg 96.253 kg 94.53 kg   Weight Method     Standing Scale Standing Scale Standing Scale   BMI (calculated) 28.1 kg/m2 27.5 kg/m2  28.9 kg/m2 28.9 kg/m2 29.4 kg/m2        Constitutional: Cooperative, alert, no acute distress.  Neck:  ++JVP   Cardiac: Regular rate and rhythm, normal S1 and S2; no S3 or S4, no murmurs, no rubs, no gallops.  Pulmonary: Clear to auscultation bilaterally, no wheezing, no rhonchi, no rales.  Extremities 1-2+ pitting LE edema  Vascular: +2 pulses in radial artery bilaterally, 2+ pedal pulses bilaterally.    -------------------------------------------------------------------------------------  Signed by:         Francis Gaines, NP         [facility]  Attestation:  I have seen the patient and performed the substantive portion of the visit with my personal exam as below.    I have reviewed and updated the documented findings and plan of care accordingly.     Physical Exam  Constitutional: Cooperative, alert, no acute distress.  Neck: No carotid bruits, JVP normal.  Cardiac: Regular rate and rhythm, normal S1 and S2; no S3 or S4, no murmur, no rubs, no gallops.  Pulmonary: Clear to auscultation bilaterally, no wheezing, no rhonchi, no rales.  Extremities: 1+ edema.  Vascular: +2 pulses in radial artery bilaterally, 2+ pedal pulses bilaterally.    Clarice Pole, MD      [facility] Contact Information   [facility]  Secure Chat (Group):   [facility] [place] [facility]    APP Spectralink:  [phone]    MD Spectralink :  [phone]    After hours, non urgent consult line:  [phone]    After hours, physician on-call:  [place] (Group):   [place] [facility]    APP [place]:  [phone]    MD [place] :  [phone]      After hours, non urgent consult line:  [phone]    After hours, physician on-call:  [place] (Group):   [facility]    APP [place]:  [phone]    MD  [place] :  [phone]      After hours, non urgent consult line:  [phone]    After hours, physician on-call:  [place] (Group):   [place]:  [phone]    MD [place] :  [phone]      After hours, non urgent consult line:  [phone]    After hours, physician on-call:  [phone]       This note was generated by the Dragon speech recognition and may contain errors or omissions not intended by the user. Grammatical errors, random word insertions, deletions, pronoun errors, and incomplete sentences are occasional consequences of this technology due to software limitations. Not all errors are caught or corrected. If there are questions or concerns about the content of this note or information contained within the body of this dictation, they should be addressed directly with the author for clarification.

## 2022-09-05 NOTE — UM Notes (Signed)
Patient Name: [name] [name]   Patient DOB: 1946/05/24    Reference Auth # [id number]   Review for DOS: Admission clinicals for 09/04/22-09/05/22    C/b to [name] [name] [phone]  [facility] Utilization Review   NPI #: [id number]   Tax ID#  [id number]  Department Phone#: [phone]  Please fax final authorization and requests for additional information to [phone]      09/04/22 1435  ADMIT TO INPATIENT  Once        Diagnosis: Acute On Chronic Right-Sided Heart Failure  Level of Care: Step Down/ Prog Care [facility], [facility], [facility] ONLY  Patient Class: Inpatient   References:    [facility] Bed Placement Criteria    [facility] Bed Placement Criteria    [facility] Bed Placement Criteria    [facility] Bed Placement Criteria    [facility] Bed Placement Criteria    New Service Definitions Feb 2023   Question Answer Comment   Admitting Physician MALIVUK, MATTHEW    Service: Medicine    Estimated Length of Stay > or = to 2 midnights    Tentative Discharge Plan? Home or Self Care                DIAGNOSIS    ICD-10-CM    1. Acute on chronic right-sided heart failure  I50.813       2. Elevated troponin  R79.89             PRESENTED TO ED: 09/04/2022 at 1206     Reason For Hospitalization:    Pt is a 76 y.o. male who arrived to the ER with C/OShortness of Breath    Sent from [facility] clinic for follow up appointment. Pt has had weight gain, increasing shortness of breath. Gained about 8lbs in the last two weeks. Pt also complains of upper chest discomfort across the top of the chest. Feels like "hammering".     On EXAM : JVP++, 2+ pitting edema BLE    PMH:  has a past medical history of Acute CHF, Acute systolic (congestive) heart failure (08/2017), Anxiety, CAD (coronary artery disease), Cardiomyopathy, CHF (congestive heart failure) (08/12/2014, 2013), Chronic obstructive pulmonary disease, Coronary artery disease (2011), Depression, echocardiogram (02/2016, 11/2016, 09/2017, 12/20/2017), GERD (gastroesophageal reflux disease), Heart attack  (2011), Hyperlipemia, Hypertension, ICD (implantable 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, Ischemic cardiomyopathy, Nonischemic cardiomyopathy, NSTEMI (non-ST elevated myocardial infarction), 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    Vitals:  98, HR 94, RR 37-25-28, BP 118/87-99/79, sats 79-72% RA - O2 2L NC placed and sats to 95-97%    ED Abnormal Labs:  WBC 13.15, Platelets 118, RBC 3.81, hs Trop 66.2-58.9, NT-proBNP 11,362, CRP 2.20, SedRate 24, Glucose 141, BUN 46, Cr 2.0, Bili total 2.4, PTINR 17.1/1.5    Radiologic Exams:   Chest AP Portable    Result Date: 09/04/2022   No acute abnormality within the chest.  [name] 09/04/2022 12:51 PM        EKG: Atrial-sensed ventricular-paced rhythm OCCASIONAL PREMATURE VENTRICULAR COMPLEXES   ABNORMAL ECG   WHEN COMPARED WITH ECG OF 11-Mar-2022 23:07,   PREMATURE VENTRICULAR COMPLEXES ARE NOW PRESENT   VENTRICULAR RATE HAS INCREASED BY  12 BPM     ED meds: 4mg  IV Morphine, 4mg  IV Zofran    Transfer to PCU as Inpatient. IP admit order 09/04/22 1435.  ADMIT : worsening CHF symptoms despite escalation in his oral diuretics  MD H&P :  Assessment/Plan:      76 year old male with prior history of systolic congestive heart failure EF of 25 to 30% status post ICD Medtronic in place, diabetes, hypertension, DVT, CKD stage III, on chronic anticoagulation presented from [facility] office due to fluid overload concern from noncompliance with medications.     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  -- Continue with congestive heart failure protocol including with strict I's and O's, daily weight, low-salt sodium diet, there is high risk of noncompliance  --Per [facility]'s recommendation we will start on Bumex 3 mg IV x1 injection followed by Bumex 2 mg IV twice daily, monitor I's and O's  -- Monitor on telemetry, continue to trend troponins  -- [facility] cardiology already on board.  -- [facility] plan to device interrogation for ICD for possible arrhythmias, monitor electrolytes and replete as necessary  -- Continue Jardiance, metolazone 10 mg, metoprolol XL 12.5 mg daily and Diovan 20  -- Added proBNP     #Elevated inflammatory markers ESR and CRP and troponin with pericarditis?   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  Blood sugar on arrival 141 seems to be well controlled  -- Hold oral medications in the hospital start patient on a long-acting insulin 80% of home dose and low-dose sliding scale 3 times daily and nightly.  -- Consistent carbohydrate diet     #Leukocytosis of 13 K without any fever or signs of infection likely reactive in nature or stable chronic CLL.   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     Chest x-ray without any pneumonia.    --Monitor CBC     #Hypertension, chronic and remains stable well-controlled . currently on metoprolol XL 12.5 mg, Diovan 20 mg, and IV BUMEX which will be replaced by IV Bumex during hospital stay.   -- Monitor blood  pressure trend     #CKD stage III, creatinine baseline 1.6-1.8.  Today creatinine of 2.0 BUN of 46 with GFR of 34 anion gap of 10 without any metabolic acidosis.  -- Continue BUN and creatinine monitoring due to IV diuresis avoid nephrotoxic medications     #Coronary artery disease nonocclusive , last cardiac catheterization in 2014 negative . follows by [facility] as outpatient, currently on metoprolol, Lipitor, aspirin      #COPD and sleep apnea, Mild wheezing on exam and orthopnea but no shortness of breath , cough, currently on Breo and DuoNebs as needed will resume.      #Hyperlipidemia, currently on Lipitor     #Thrombocytopenia, 118. No active bleeding on exam. Monitor CBC         Consults:     Cardio consult 09/04/22 :  ASSESSMENT:   Patient is a 75 y.o. male with the following relevant diagnoses:     Acute on chronic systolic CHF. +orthopnea, LE edema, and weight gain   EF 25 to 30% by echo [name] 2022.  Moderate tricuspid and mitral regurgitation present.  PA pressures were 54 mmHg.   CP, Elevated HS trop 66.2 and inflamm markers  ? 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 Renaee Munda      RECOMMENDATIONS:      IV bumex 3 mg x 1 now, followed by 2 mg IV BID  Continue to monitor lytes and renal function  Daily standing weights and I and O's  Continue all PTA CHF medications  Device interrogation to eval for arrhythmias related to his event a couple weeks ago      Testing/Procedures:    UA + protein 100, + glucose 1000,     COVID 19 is negative    Treatment Plan:     PCU, tele monitor, diet as tol, 2g Na, fluid rest , daily EKG, supp O2, I/O, VS q4, asp prec, fall  prec, RT, Cardio consult    Current Facility-Administered Medications   Medication Dose Route Frequency    apixaban  5 mg Oral Q12H [facility]    aspirin EC  81 mg Oral Daily    atorvastatin  40 mg Oral QHS    bumetanide  2 mg Intravenous BID    empagliflozin  10 mg Oral Daily    fluticasone furoate-vilanterol  1 puff Inhalation QAM    insulin glargine  40 Units Subcutaneous QHS    insulin lispro  1-3 Units Subcutaneous QHS    insulin lispro  1-5 Units Subcutaneous TID AC    metOLazone  10 mg Oral Daily    metoprolol succinate XL  12.5 mg Oral Daily    valsartan  20 mg Oral Daily     Current Facility-Administered Medications   Medication Dose Route Frequency Last Rate     Current Facility-Administered Medications   Medication Dose Route    albuterol-ipratropium  3 mL Nebulization    dextrose  15 g of glucose Oral    Or    dextrose  12.5 g Intravenous    Or    dextrose  12.5 g Intravenous    Or    glucagon (rDNA)  1 mg Intramuscular      This clinical review is based on/compiled from documentation provided by the treatment team within the patient's medical record.      On PCU 09/05/22 :  Still with LE swelling and tachypnea   Cont IV Bumex    97.8, HR 76, RR 20, BP 97/76-95/77, sats 94-99% RA to O2 2L NC  WBC 14.86, Hgb 12.2, Platelets 118, RBC 3.63, Glucose 165, BUN 51, Cr 2.3      Per Cardio 09/05/22 :  RECOMMENDATIONS:      Continue Eliquis 5 mg bid  Continue ASA and statin  On Bumex 2 mg IV bid- to continue to diurese; renal was following pt last admission- careful watch on creat  Metolazone 10 mg daily  Continue Valsartan  20 mg  daily  Continue Toprol 12.5 mg daily  Daily I/Os  Daily standing weights  device interrogation- unsure if complete

## 2022-09-06 DIAGNOSIS — I50813 Acute on chronic right heart failure: Secondary | ICD-10-CM

## 2022-09-06 DIAGNOSIS — Z95810 Presence of automatic (implantable) cardiac defibrillator: Secondary | ICD-10-CM

## 2022-09-06 DIAGNOSIS — I10 Essential (primary) hypertension: Secondary | ICD-10-CM

## 2022-09-06 DIAGNOSIS — I5023 Acute on chronic systolic (congestive) heart failure: Secondary | ICD-10-CM

## 2022-09-06 DIAGNOSIS — I428 Other cardiomyopathies: Secondary | ICD-10-CM

## 2022-09-06 DIAGNOSIS — D72829 Elevated white blood cell count, unspecified: Secondary | ICD-10-CM

## 2022-09-06 LAB — BASIC METABOLIC PANEL
Anion Gap: 11 (ref 5.0–15.0)
BUN: 55 mg/dL — ABNORMAL HIGH (ref 9.0–28.0)
CO2: 24 mEq/L (ref 17–29)
Calcium: 9.1 mg/dL (ref 7.9–10.2)
Chloride: 104 mEq/L (ref 99–111)
Creatinine: 2 mg/dL — ABNORMAL HIGH (ref 0.5–1.5)
Glucose: 114 mg/dL — ABNORMAL HIGH (ref 70–100)
Potassium: 3.7 mEq/L (ref 3.5–5.3)
Sodium: 139 mEq/L (ref 135–145)
eGFR: 34 mL/min/{1.73_m2} — AB (ref >=60)

## 2022-09-06 LAB — ECG 12-LEAD
Atrial Rate: 68 {beats}/min
P Axis: 55 degrees
P-R Interval: 160 ms
Q-T Interval: 438 ms
QRS Duration: 92 ms
QTC Calculation (Bezet): 465 ms
R Axis: 0 degrees
T Axis: 118 degrees
Ventricular Rate: 68 {beats}/min

## 2022-09-06 LAB — GLUCOSE WHOLE BLOOD - POCT
Whole Blood Glucose POCT: 95 mg/dL (ref 70–100)
Whole Blood Glucose POCT: 133 mg/dL — ABNORMAL HIGH (ref 70–100)
Whole Blood Glucose POCT: 197 mg/dL — ABNORMAL HIGH (ref 70–100)
Whole Blood Glucose POCT: 225 mg/dL — ABNORMAL HIGH (ref 70–100)
Whole Blood Glucose POCT: 203 mg/dL — ABNORMAL HIGH (ref 70–100)

## 2022-09-06 LAB — MAGNESIUM: Magnesium: 2.1 mg/dL (ref 1.6–2.6)

## 2022-09-06 MED ORDER — INSULIN GLARGINE 100 UNIT/ML SC SOLN
32.0000 [IU] | Freq: Every evening | SUBCUTANEOUS | Status: DC
Start: 2022-09-06 — End: 2022-09-08
  Administered 2022-09-06 – 2022-09-07 (×2): 32 [IU] via SUBCUTANEOUS
  Filled 2022-09-06 (×2): qty 32

## 2022-09-06 NOTE — Plan of Care (Signed)
Problem: Everyday - Heart Failure  Goal: Stable Vital Signs and Fluid Balance  Outcome: Progressing  Flowsheets (Taken 09/06/2022 1431)  Stable Vital Signs and Fluid Balance:   Daily Standing Weights in the morning using the same scale, after using the bathroom and before breadfast.  If unable to stand, zero the bed and use the bed scale   Monitor, assess vital signs and telemetry per policy   Monitor labs and report abnormalities to physician   Strict Intake/Output   Fluid Restriction   Assess for swelling/edema   Wean oxygen as needed if appropriate  Goal: Mobility/Activity is Maintained at Optimal Level for Patient  Outcome: Progressing  Flowsheets (Taken 09/06/2022 1431)  Mobility/Activity is Maintained at Optimal Level for Patient:   Perform active/passive ROM   Maintain SCD's as Ordered   Increase mobility as tolerated/progressive mobility protocol   Reposition patient every 2 hours and as needed unless able to reposition self   Consult with Physical Therapy and/or Occupational Therapy   Assess for changes in respiratory status, level of consciousness and/or development of fatigue  Goal: Nutritional Intake is Adequate  Outcome: Progressing  Flowsheets (Taken 09/06/2022 1431)  Nutritional Intake is Adequate:   Cardiac diet-2 gm Sodium   Consult/Collaborate with Nutritionist   Fluid Restricction if needed   Patient and family teaching on low sodium diet   Encourage/perform oral hygiene as appropriate  Goal: Teaching-Using CHF Warning Zones and Educational Videos  Outcome: Progressing  Flowsheets (Taken 09/06/2022 1431)  Teaching-Using CHF Warning Zones and Educational Videos:   Daily Standing Weights & record   CHF Warning Zones and when to call for help   Medications   Signs & Symptoms of CHF   Fluid Restriction if appropriate   Document in the Education Tab in EPIC with Teach-back   Sodium Restriction

## 2022-09-06 NOTE — Progress Notes (Addendum)
[facility] PROGRESS NOTE    [facility]  [facility] [address] PROGRESSIVE CARE  [address] [address]  [place] [address]  Dept: [phone]  Loc: [phone]    Date Time: 09/06/22 10:21 AM  Patient Name: [name] [name] Day: 2    Subjective/Cardiac Relevant Events:   Neg 2814 cc; wt down 7 lbs from yest  SBP soft in 90's  Creat 2.0; K 3.7; Mag 2.1    Feels good. Slept better. Denies SOB, CP, dizziness or palpitations  Inquiring about Calquence, his treatment for cancer. I don't believe it is on formulary    ASSESSMENT:   Patient is a 76 y.o. male with the following relevant diagnoses:    Acute on chronic systolic CHF. +orthopnea, LE edema, and weight gain   EF 25 to 30% by echo August 2022.  Moderate tricuspid and mitral regurgitation present.  PA pressures were 54 mmHg.   CP, Elevated HS trop 66.2 and inflamm markers  ? 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           RECOMMENDATIONS:     Continue Eliquis 5 mg bid  Continue ASA and statin  On Bumex 2 mg IV bid- to continue and hopefully change to PO next day or so  Metolazone 10 mg daily  Continue Valsartan 20 mg  daily  Continue Toprol 12.5 mg daily  Daily I/Os  Daily standing weights  Device interrogation about 2 weeks ago per pt    Telemetry/Labs Reviewed/Intake-Output:     Telemetry reviewed: no to this am    Recent Labs   Lab 09/04/22  1628 09/04/22  1225   hs Troponin-I 58.9* 66.2*   hs Troponin-I Delta -7.3  --              Recent Labs   Lab 09/04/22  1225   Bilirubin, Total 2.4*   Protein, Total 6.9   Albumin 3.9   ALT 14   AST (SGOT) 16     Recent Labs    Lab 09/06/22  0644   Magnesium 2.1     Recent Labs   Lab 09/04/22  1257   PT 17.1*   PT INR 1.5*   PTT 34     Recent Labs   Lab 09/05/22  0758 09/04/22  1225   WBC 14.86* 13.15*   Hgb 12.2* 12.6   Hematocrit 38.5 39.1   Platelets 118* 118*     Recent Labs   Lab 09/06/22  0644 09/05/22  0758 09/04/22  1225   Sodium 139 140 140   Potassium 3.7 4.5 4.5   Chloride 104 106 109   CO2 24 22 21    BUN 55.0* 51.0* 46.0*   Creatinine 2.0* 2.3* 2.0*   eGFR 34.0* 28.8* 34.0*   Glucose 114* 165* 141*   Calcium 9.1 9.4 9.5         .  Lab Results   Component Value Date    BNP 11,362 (H) 09/04/2022          Intake/Output Summary (Last 24 hours) at 09/06/2022 1021  Last data filed at 09/06/2022 1006  Gross per 24 hour   Intake 1326 ml   Output 4140 ml   Net -2814 ml       Medications:      Scheduled Meds: PRN Meds:    apixaban, 5 mg, Oral, Q12H [name]  aspirin EC, 81 mg, Oral, Daily  atorvastatin, 40 mg, Oral, QHS  bumetanide, 2 mg, Intravenous, BID  empagliflozin, 10 mg, Oral, Daily  fluticasone furoate-vilanterol, 1 puff, Inhalation, QAM  insulin glargine, 32 Units, Subcutaneous, QHS  insulin lispro, 1-3 Units, Subcutaneous, QHS  insulin lispro, 1-5 Units, Subcutaneous, TID AC  metOLazone, 10 mg, Oral, Daily  metoprolol succinate XL, 12.5 mg, Oral, Daily  valsartan, 20 mg, Oral, Daily        Continuous Infusions:   albuterol-ipratropium, 3 mL, Q6H PRN  dextrose, 15 g of glucose, PRN   Or  dextrose, 12.5 g, PRN   Or  dextrose, 12.5 g, PRN   Or  glucagon (rDNA), 1 mg, PRN          Physical Exam:     Vitals:    09/05/22 2009 09/06/22 0007 09/06/22 0404 09/06/22 0733   BP: 101/84 93/63 92/62  93/65   Pulse: 75 68 64 78   Resp: 18 17 18 16    Temp: 97.4 F (36.3 C) 97.1 F (36.2 C) 97.3 F (36.3 C) 97.5 F (36.4 C)   TempSrc: Oral Oral Oral Oral   SpO2: 94% 96% 96% 98%   Weight:   91.4 kg (201 lb 9.6 oz)    Height:             07/24/2022    10:23 AM 08/04/2022     4:18 PM 09/04/2022     9:54 AM 09/04/2022    11:40 AM 09/04/2022     5:42  PM 09/05/2022     6:08 AM 09/06/2022     4:04 AM   Weight Monitoring   Height 182.9 cm  182.9 cm 182.9 cm 181 cm     Height Method    Stated      Weight 91.627 kg 94.257 kg 96.616 kg 96.616 kg 96.253 kg 94.53 kg 91.445 kg   Weight Method    Standing Scale Standing Scale Standing Scale Standing Scale   BMI (calculated) 27.5 kg/m2  28.9 kg/m2 28.9 kg/m2 29.4 kg/m2         Constitutional: Cooperative, alert, no acute distress.  Neck:+ JVP- improved  Cardiac: Regular rate and rhythm, normal S1 and S2; no S3 or S4, no murmurs, no rubs, no gallops.  Pulmonary: Clear to auscultation bilaterally, no wheezing, no rhonchi, no rales.  Extremities: 1+ bilat pedal edema with faint pitting to lower shins  Vascular: +2 pulses in radial artery bilaterally, 2+ pedal pulses bilaterally.    -------------------------------------------------------------------------------------  Signed by:         Francis Gaines, NP     Attestation:  The patient has been seen and examined. VS reviewed. My Exam as below:    Constitutional: Cooperative, alert, no acute distress.  Neck:+ JVP- improved  Cardiac: Regular rate and rhythm, normal S1 and S2; no S3 or S4, no murmurs, no rubs, no gallops.  Pulmonary: Clear to auscultation bilaterally, no wheezing, no rhonchi, no rales.  Extremities: 1+ bilat pedal edema with faint pitting to lower shins  Vascular: +2 pulses in radial artery bilaterally, 2+ pedal pulses bilaterally  My assessment and plan as detailed above. In addition, please note the following:  Aundria Mems, MD            [facility]    [facility] Contact Information   [facility]  Secure Chat (Group):   [facility] [place] [facility]    APP Spectralink:  [phone]    MD Spectralink :  [phone]  [phone]    After hours, non urgent consult line:  [phone]    After hours, physician on-call:  [phone] [name] [place]  Secure Chat (Group):   [facility] [place] [facility]    APP Spectralink:  [phone]    MD Spectralink  :  [phone]      After hours, non urgent consult line:  [phone]    After hours, physician on-call:  [phone] [name]  Secure Chat (Group):   [facility]    APP Spectralink:  [phone]    MD Spectralink :  [phone]      After hours, non urgent consult line:  [phone]    After hours, physician on-call:  [phone] [facility]  Secure Chat (Group):   [facility]    APP Spectralink:  [phone]    MD Spectralink :  [phone]      After hours, non urgent consult line:  [phone]    After hours, physician on-call:  [phone]       This note was generated by the Dragon speech recognition and may contain errors or omissions not intended by the user. Grammatical errors, random word insertions, deletions, pronoun errors, and incomplete sentences are occasional consequences of this technology due to software limitations. Not all errors are caught or corrected. If there are questions or concerns about the content of this note or information contained within the body of this dictation, they should be addressed directly with the author for clarification.

## 2022-09-06 NOTE — Plan of Care (Signed)
Problem: Pain interferes with ability to perform ADL  Goal: Pain at adequate level as identified by patient  Outcome: Progressing  Flowsheets (Taken 09/04/2022 2002 by Nickolas Madrid, RN)  Pain at adequate level as identified by patient: Identify patient comfort function goal     Problem: Side Effects from Pain Analgesia  Goal: Patient will experience minimal side effects of analgesic therapy  Outcome: Progressing  Flowsheets (Taken 09/05/2022 2214)  Patient will experience minimal side effects of analgesic therapy:   Monitor/assess patient's respiratory status (RR depth, effort, breath sounds)   Prevent/manage side effects per LIP orders (i.e. nausea, vomiting, pruritus, constipation, urinary retention, etc.)   Evaluate for opioid-induced sedation with appropriate assessment tool (i.e. POSS)   Assess for changes in cognitive function     Problem: Moderate/High Fall Risk Score >5  Goal: Patient will remain free of falls  Outcome: Progressing  Flowsheets (Taken 09/06/2022 2045)  Moderate Risk (6-13):   MOD-Consider activation of bed alarm if appropriate   MOD-Floor mat at bedside (where available) if appropriate   MOD-Remain with patient during toileting   MOD-Perform dangle, stand, walk (DSW) prior to mobilization   MOD-include family in multidisciplinary POC discussions     Problem: Everyday - Heart Failure  Goal: Stable Vital Signs and Fluid Balance  Outcome: Progressing  Flowsheets (Taken 09/06/2022 1431 by Joaquin Music, RN)  Stable Vital Signs and Fluid Balance:   Daily Standing Weights in the morning using the same scale, after using the bathroom and before breadfast.  If unable to stand, zero the bed and use the bed scale   Monitor, assess vital signs and telemetry per policy   Monitor labs and report abnormalities to physician   Strict Intake/Output   Fluid Restriction   Assess for swelling/edema   Wean oxygen as needed if appropriate  Goal: Mobility/Activity is Maintained at Optimal Level for  Patient  Outcome: Progressing  Flowsheets (Taken 09/06/2022 1431 by Joaquin Music, RN)  Mobility/Activity is Maintained at Optimal Level for Patient:   Perform active/passive ROM   Maintain SCD's as Ordered   Increase mobility as tolerated/progressive mobility protocol   Reposition patient every 2 hours and as needed unless able to reposition self   Consult with Physical Therapy and/or Occupational Therapy   Assess for changes in respiratory status, level of consciousness and/or development of fatigue  Goal: Nutritional Intake is Adequate  Outcome: Progressing  Flowsheets (Taken 09/06/2022 1431 by Joaquin Music, RN)  Nutritional Intake is Adequate:   Cardiac diet-2 gm Sodium   Consult/Collaborate with Nutritionist   Fluid Restricction if needed   Patient and family teaching on low sodium diet   Encourage/perform oral hygiene as appropriate  Goal: Teaching-Using CHF Warning Zones and Educational Videos  Outcome: Progressing  Flowsheets (Taken 09/06/2022 1431 by Joaquin Music, RN)  Teaching-Using CHF Warning Zones and Educational Videos:   Daily Standing Weights & record   CHF Warning Zones and when to call for help   Medications   Signs & Symptoms of CHF   Fluid Restriction if appropriate   Document in the Education Tab in EPIC with Teach-back   Sodium Restriction

## 2022-09-06 NOTE — Progress Notes (Signed)
[facility] HOSPITAL[facility] Progress Note  Patient Info:   Date/Time: 09/06/2022 / 4:26 PM   Admit Date:09/04/2022  Patient Name:[name] [name]   QIO:[id number]   PCP: Zorita Pang, MD  Attending Physician:Dena Esperanza, Marcelle Smiling, MD  Assessment/Plan:       76 year old male with prior history of systolic congestive heart failure EF of 25 to 30% status post ICD Medtronic in place, diabetes, hypertension, DVT, CKD stage III, on chronic anticoagulation presented from [facility] office due to fluid overload concern from noncompliance with medications.     #Acute on chronic systolic congestive heart failure exacerbation   Endorses around 8 pound weight gain, last echocardiogram 06/07/2021 revealed EF of 25 to 30% moderate tricuspid and mitral regurgitation  -Chest x-ray: No acute abnormality within the chest, elevated BNP  - strict I's and O's, daily weight, low-salt sodium diet, there is high risk of noncompliance  --Bumex 2 mg IV twice daily, monitor I's and O's  -- Monitor on telemetry, continue to trend troponins  -- Appreciate cardiology input, very good urine output so far  -- [facility] plan to device interrogation for ICD for possible arrhythmias, monitor electrolytes and replete as necessary  -- Continue Jardiance, metolazone 10 mg, metoprolol XL 12.5 mg daily and Diovan 20  -- Overall feeling better since yesterday, weaned off oxygen this morning     #Mildly elevated ESR/CRP  -Unclear if this is pericarditis less likely, patient without chest pain     #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  Decrease Lantus today     #Leukocytosis of 13 K without any fever or signs of infection likely reactive in nature or stable chronic CLL.   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     Chest x-ray without any pneumonia.    --Monitor CBC tomorrow     #Hypertension, chronic and remains stable well-controlled . currently on metoprolol  XL 12.5 mg, Diovan 20 mg, and IV BUMEX which will be replaced by IV Bumex during hospital stay.   -- Monitor blood pressure trend     #CKD stage III, creatinine baseline 1.6-1.8.  Today creatinine of 2  -- Monitor daily with diuresis     #Coronary artery disease nonocclusive , last cardiac catheterization in 2014 negative . follows by [facility] as outpatient, currently on metoprolol, Lipitor, aspirin      #COPD and sleep apnea, stable, continue inhalers     #Hyperlipidemia, currently on Lipitor     #Thrombocytopenia, overall stable, monitor  #History of DVT: Eliquis      DVT Prophylaxis: Eliquis  Central Line/Foley Catheter/PICC line status: None  Code Status: Full Code  Disposition:Planning to discharge patient to home  Type of Admission:Inpatient  Hospital Problems:     Active Hospital Problems    Diagnosis    Acute on chronic right-sided heart failure     Subjective:   Chief Complaint:  Shortness of Breath  overall better, no fevers, off oxygen, denies chest pain, discussed with nursing to call respiratory for round of nebs  09/06/22   Review of Systems   Constitutional:  Negative for chills and fever.   Respiratory:  Positive for cough. Negative for hemoptysis.    Cardiovascular:  Negative for chest pain.   Gastrointestinal:  Negative for abdominal pain, blood in stool, nausea and vomiting.   Neurological:  Negative for dizziness, focal weakness and weakness.     Objective:  Vitals:    09/06/22 0404 09/06/22 0733 09/06/22 1200 09/06/22 1548   BP: 92/62 93/65 111/79 99/84   Pulse: 64 78 79 87   Resp: 18 16 17 17    Temp: 97.3 F (36.3 C) 97.5 F (36.4 C) 97.8 F (36.6 C) 97.4 F (36.3 C)   TempSrc: Oral Oral Temporal Temporal   SpO2: 96% 98% 99% 96%   Weight: 91.4 kg (201 lb 9.6 oz)      Height:         Physical Exam:   Physical Exam  Constitutional:       Appearance: Normal appearance.   HENT:      Head: Normocephalic and atraumatic.   Eyes:      Extraocular Movements: Extraocular movements intact.    Cardiovascular:      Rate and Rhythm: Normal rate and regular rhythm.      Heart sounds: Murmur heard.      Comments: Left upper chest device palpable  Pulmonary:      Effort: No respiratory distress.      Comments: good air entry with mild scattered coarse wheezing today   Abdominal:      General: Abdomen is flat.      Palpations: Abdomen is soft.      Tenderness: There is no abdominal tenderness. There is no guarding or rebound.   Musculoskeletal:      Cervical back: Neck supple.      Comments: Significantly improved lower extremity edema   Skin:     Findings: No rash.   Neurological:      Mental Status: He is alert.      Comments: Non focal grossly    Psychiatric:         Mood and Affect: Mood normal.       Current Medications      Scheduled Meds: PRN Meds:    apixaban, 5 mg, Oral, Q12H [facility]  aspirin EC, 81 mg, Oral, Daily  atorvastatin, 40 mg, Oral, QHS  bumetanide, 2 mg, Intravenous, BID  empagliflozin, 10 mg, Oral, Daily  fluticasone furoate-vilanterol, 1 puff, Inhalation, QAM  insulin glargine, 32 Units, Subcutaneous, QHS  insulin lispro, 1-3 Units, Subcutaneous, QHS  insulin lispro, 1-5 Units, Subcutaneous, TID AC  metOLazone, 10 mg, Oral, Daily  metoprolol succinate XL, 12.5 mg, Oral, Daily  valsartan, 20 mg, Oral, Daily        Continuous Infusions:   albuterol-ipratropium, 3 mL, Q6H PRN  dextrose, 15 g of glucose, PRN   Or  dextrose, 12.5 g, PRN   Or  dextrose, 12.5 g, PRN   Or  glucagon (rDNA), 1 mg, PRN          Results of Labs/imaging   Labs for the last 24 hours have been reviewed:   Coagulation Profile:   Recent Labs   Lab 09/04/22  1257   PT 17.1*   PT INR 1.5*   PTT 34         CBC review:   Recent Labs   Lab 09/05/22  0758 09/04/22  1225   WBC 14.86* 13.15*   Hgb 12.2* 12.6   Hematocrit 38.5 39.1   Platelets 118* 118*   MCV 106.1* 102.6*   RDW 17* 17*   Neutrophils 14.7  --    Segmented Neutrophils  --  14   Neutrophils Absolute 2.18  --    Lymphocytes Automated 83.4  --    Eosinophils Automated  0.8  --    Immature Granulocytes 0.1  --  Immature Granulocytes Absolute 0.02  --        Chem Review:  Recent Labs   Lab 09/06/22  0644 09/05/22  0758 09/04/22  1225   Sodium 139 140 140   Potassium 3.7 4.5 4.5   Chloride 104 106 109   CO2 24 22 21    BUN 55.0* 51.0* 46.0*   Creatinine 2.0* 2.3* 2.0*   Glucose 114* 165* 141*   Calcium 9.1 9.4 9.5   Magnesium 2.1  --  2.3   Phosphorus  --   --  4.0   Bilirubin, Total  --   --  2.4*   AST (SGOT)  --   --  16   ALT  --   --  14   Alkaline Phosphatase  --   --  69       Results       Procedure Component Value Units Date/Time    Glucose Whole Blood - POCT [[id number]]  (Abnormal) Collected: 09/06/22 1546     Updated: 09/06/22 1552     Whole Blood Glucose POCT 197 mg/dL     Glucose Whole Blood - POCT [[id number]]  (Abnormal) Collected: 09/06/22 1147     Updated: 09/06/22 1155     Whole Blood Glucose POCT 133 mg/dL     Glucose Whole Blood - POCT [[id number]] Collected: 09/06/22 0729     Updated: 09/06/22 0741     Whole Blood Glucose POCT 95 mg/dL     Magnesium [[id number]] Collected: 09/06/22 0644    Specimen: Blood Updated: 09/06/22 0725     Magnesium 2.1 mg/dL     Basic Metabolic Panel [[id number]]  (Abnormal) Collected: 09/06/22 0644    Specimen: Blood Updated: 09/06/22 0725     Glucose 114 mg/dL      BUN 02.5 mg/dL      Creatinine 2.0 mg/dL      Calcium 9.1 mg/dL      Sodium 427 mEq/L      Potassium 3.7 mEq/L      Chloride 104 mEq/L      CO2 24 mEq/L      Anion Gap 11.0     eGFR 34.0 mL/min/1.73 m2     Glucose Whole Blood - POCT [[id number]]  (Abnormal) Collected: 09/05/22 2117     Updated: 09/05/22 2121     Whole Blood Glucose POCT 203 mg/dL           Radiology reports have been reviewed:  Radiology Results (24 Hour)       ** No results found for the last 24 hours. **          Chest AP Portable    Result Date: 09/04/2022  HISTORY: Chest Pain COMPARISON: 03/12/2022 FINDINGS: Left chest pacemaker/defibrillator. The lungs are clear. No pleural effusion or pneumothorax. Stable  enlarged cardiac silhouette. No acute osseous abnormality. Bone island in the anterolateral left sixth rib.      No acute abnormality within the chest.  August Albino 09/04/2022 12:51 PM    Pathology:   Specimens (From admission, onward)      None          Last EKG Result       Procedure Component Value Units Date/Time    ECG 12 lead [[id number]] Collected: 09/04/22 1148     Updated: 09/04/22 1354     Ventricular Rate 89 BPM      Atrial Rate 89 BPM      P-R Interval 150 ms  QRS Duration 94 ms      Q-T Interval 416 ms      QTC Calculation (Bezet) 506 ms      P Axis 69 degrees      R Axis -5 degrees      T Axis 102 degrees      [facility] MUSE NARRATIVE AND IMPRESSION --     Atrial-sensed ventricular-paced rhythm OCCASIONAL PREMATURE VENTRICULAR COMPLEXES  ABNORMAL ECG  WHEN COMPARED WITH ECG OF 11-Mar-2022 23:07,  PREMATURE VENTRICULAR COMPLEXES ARE NOW PRESENT  VENTRICULAR RATE HAS INCREASED BY  12 BPM  Confirmed by Crimm, [place] [phone]) on 09/04/2022 1:53:27 PM      Narrative:      Atrial-sensed ventricular-paced rhythm OCCASIONAL PREMATURE VENTRICULAR COMPLEXES  ABNORMAL ECG  WHEN COMPARED WITH ECG OF 11-Mar-2022 23:07,  PREMATURE VENTRICULAR COMPLEXES ARE NOW PRESENT  VENTRICULAR RATE HAS INCREASED BY  12 BPM  Confirmed by Crimm, [place] ([id number]) on 09/04/2022 1:53:27 PM    ECG 12 lead (Electrocardiogram) [[id number]] Collected: 09/05/22 0535     Updated: 09/05/22 0536     Ventricular Rate 76 BPM      Atrial Rate 76 BPM      P-R Interval 160 ms      QRS Duration 88 ms      Q-T Interval 442 ms      QTC Calculation (Bezet) 497 ms      P Axis 72 degrees      R Axis 14 degrees      T Axis 109 degrees      [facility] MUSE NARRATIVE AND IMPRESSION --     Atrial-sensed ventricular-paced rhythm  ABNORMAL ECG      Narrative:      Atrial-sensed ventricular-paced rhythm  ABNORMAL ECG    EKG SCAN [[id number]] Resulted: 09/04/22 2348     Updated: 09/04/22 2348          Hospitalist   Signed by:   Krystal Eaton, MD  09/06/2022 4:26 PM  Time spent  in evaluating and managing the care of the patient including face-to-face and non-face-to-face: 35 minutes    *This note was generated by the Epic EMR system/ Dragon speech recognition and may contain inherent errors or omissions not intended by the user. Grammatical errors, random word insertions, deletions, pronoun errors and incomplete sentences are occasional consequences of this technology due to software limitations. Not all errors are caught or corrected. If there are questions or concerns about the content of this note or information contained within the body of this dictation they should be addressed directly with the author for clarification

## 2022-09-07 ENCOUNTER — Ambulatory Visit: Payer: Self-pay

## 2022-09-07 LAB — BASIC METABOLIC PANEL
Anion Gap: 12 (ref 5.0–15.0)
BUN: 53 mg/dL — ABNORMAL HIGH (ref 9.0–28.0)
CO2: 29 mEq/L (ref 17–29)
Calcium: 9.4 mg/dL (ref 7.9–10.2)
Chloride: 98 mEq/L — ABNORMAL LOW (ref 99–111)
Creatinine: 1.9 mg/dL — ABNORMAL HIGH (ref 0.5–1.5)
Glucose: 106 mg/dL — ABNORMAL HIGH (ref 70–100)
Potassium: 4.3 mEq/L (ref 3.5–5.3)
Sodium: 139 mEq/L (ref 135–145)
eGFR: 36.2 mL/min/{1.73_m2} — AB (ref >=60)

## 2022-09-07 LAB — GLUCOSE WHOLE BLOOD - POCT
Whole Blood Glucose POCT: 136 mg/dL — ABNORMAL HIGH (ref 70–100)
Whole Blood Glucose POCT: 161 mg/dL — ABNORMAL HIGH (ref 70–100)
Whole Blood Glucose POCT: 134 mg/dL — ABNORMAL HIGH (ref 70–100)
Whole Blood Glucose POCT: 254 mg/dL — ABNORMAL HIGH (ref 70–100)

## 2022-09-07 LAB — PT AND APTT
PT INR: 1.5 — ABNORMAL HIGH (ref 0.9–1.1)
PT: 17.4 s — ABNORMAL HIGH (ref 10.1–12.9)
PTT: 38 s (ref 27–39)

## 2022-09-07 LAB — MAGNESIUM: Magnesium: 2.2 mg/dL (ref 1.6–2.6)

## 2022-09-07 LAB — IGG: Immunoglobulin G: 1067 mg/dL (ref 540–1822)

## 2022-09-07 LAB — FIBRINOGEN: Fibrinogen: 402 mg/dL (ref 181–413)

## 2022-09-07 LAB — LACTATE DEHYDROGENASE: LDH: 190 U/L (ref 120–331)

## 2022-09-07 MED ORDER — TORSEMIDE 20 MG PO TABS
60.0000 mg | ORAL_TABLET | Freq: Two times a day (BID) | ORAL | Status: DC
Start: 2022-09-07 — End: 2022-09-09
  Administered 2022-09-07 – 2022-09-08 (×2): 60 mg via ORAL
  Filled 2022-09-07: qty 3
  Filled 2022-09-07: qty 2
  Filled 2022-09-07: qty 1

## 2022-09-07 MED ORDER — ACALABRUTINIB MALEATE 100 MG PO TABS
100.0000 mg | ORAL_TABLET | Freq: Two times a day (BID) | ORAL | Status: DC
Start: 2022-09-07 — End: 2022-09-09
  Administered 2022-09-07 – 2022-09-08 (×4): 100 mg via ORAL

## 2022-09-07 MED ORDER — PATIENT SUPPLIED NON FORMULARY
100.0000 mg | Freq: Two times a day (BID) | Status: DC
Start: 2022-09-07 — End: 2022-09-07

## 2022-09-07 NOTE — Progress Notes (Signed)
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     #Acute on chronic systolic congestive heart failure exacerbation   Endorses around 8 pound weight gain, last echocardiogram 06/07/2021 revealed EF of 25 to 30% moderate tricuspid and mitral regurgitation  -Chest x-ray: No acute abnormality within the chest, elevated BNP  - strict I's and O's, daily weight, low-salt sodium diet, there is high risk of noncompliance  --has been Bumex 2 mg IV twice daily, transitioning to orals by cards  -- Monitor on telemetry  -- Appreciate cardiology input,   -- advanced HF team assessment appreciated.  RHC and recommendation for Heme consult to assess overall prognosis prior to more aggressive measures ie LVAD. I consulted Dr. 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  Decrease Lantus      #Leukocytosis of 13 K without any fever or signs of infection likely reactive in nature or stable chronic CLL.   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     Chest x-ray without any pneumonia.    --Monitor CBC   -as above.       #Hypertension, chronic and remains stable well-controlled . currently on metoprolol  XL 12.5 mg, Diovan 20 mg, and IV BUMEX which will be replaced by IV Bumex during hospital stay.   -- Monitor blood pressure trend     #CKD stage III, creatinine baseline 1.6-1.8.  Today creatinine of 2  -- Monitor daily with diuresis     #Coronary artery disease nonocclusive , last cardiac catheterization in 2014 negative . follows by [facility] as outpatient, currently on metoprolol, Lipitor, aspirin      #COPD and sleep apnea, stable, continue inhalers     #Hyperlipidemia, currently on Lipitor     #Thrombocytopenia, overall stable, monitor  #History of DVT: Eliquis      DVT Prophylaxis: Eliquis  Central Line/Foley Catheter/PICC line status: None  Code Status: Full Code  Disposition:Planning to discharge patient to home  Type of Admission:Inpatient  Hospital Problems:     Active Hospital Problems    Diagnosis    Acute on chronic right-sided heart failure     Subjective:   Chief Complaint:  Shortness of Breath  Seen by cards and advanced heart failure.  Feeling overall ok. Good uop        09/07/22   Review of Systems   Constitutional:  Negative for chills and fever.   Respiratory:  Negative for cough, hemoptysis and shortness of breath.    Cardiovascular:  Negative for chest pain.   Gastrointestinal:  Negative for abdominal pain, blood in stool, nausea and vomiting.   Neurological:  Negative for dizziness, focal weakness  and weakness.     Objective:     Vitals:    09/07/22 0900 09/07/22 0922 09/07/22 1001 09/07/22 1145   BP: 108/79  101/84 99/73   Pulse:   81 79   Resp:    17   Temp:    97.3 F (36.3 C)   TempSrc:    Temporal   SpO2:  95%  99%   Weight:       Height:         Physical Exam:   Physical Exam  Constitutional:       Appearance: Normal appearance.   HENT:      Head: Normocephalic and atraumatic.   Eyes:      Extraocular Movements: Extraocular movements intact.   Cardiovascular:      Rate and Rhythm: Normal rate and regular rhythm.      Heart sounds: Murmur heard.      Comments: Left upper chest  device palpable  Pulmonary:      Effort: No respiratory distress.      Comments: good air entry with mild scattered coarse wheezing  on R  Abdominal:      General: Abdomen is flat.      Palpations: Abdomen is soft.      Tenderness: There is no abdominal tenderness. There is no guarding or rebound.   Musculoskeletal:      Cervical back: Neck supple.      Comments: Resolving lower extremity edema   Skin:     Findings: No rash.   Neurological:      Mental Status: He is alert.      Comments: Non focal grossly    Psychiatric:         Mood and Affect: Mood normal.       Current Medications      Scheduled Meds: PRN Meds:    acalabrutinib Maleate, 100 mg, Oral, Q12H SCH  apixaban, 5 mg, Oral, Q12H SCH  aspirin EC, 81 mg, Oral, Daily  atorvastatin, 40 mg, Oral, QHS  empagliflozin, 10 mg, Oral, Daily  fluticasone furoate-vilanterol, 1 puff, Inhalation, QAM  insulin glargine, 32 Units, Subcutaneous, QHS  insulin lispro, 1-3 Units, Subcutaneous, QHS  insulin lispro, 1-5 Units, Subcutaneous, TID AC  metOLazone, 10 mg, Oral, Daily  metoprolol succinate XL, 12.5 mg, Oral, Daily  torsemide, 60 mg, Oral, BID  valsartan, 20 mg, Oral, Daily        Continuous Infusions:   albuterol-ipratropium, 3 mL, Q6H PRN  dextrose, 15 g of glucose, PRN   Or  dextrose, 12.5 g, PRN   Or  dextrose, 12.5 g, PRN   Or  glucagon (rDNA), 1 mg, PRN          Results of Labs/imaging   Labs for the last 24 hours have been reviewed:   Coagulation Profile:   Recent Labs   Lab 09/04/22  1257   PT 17.1*   PT INR 1.5*   PTT 34         CBC review:   Recent Labs   Lab 09/05/22  0758 09/04/22  1225   WBC 14.86* 13.15*   Hgb 12.2* 12.6   Hematocrit 38.5 39.1   Platelets 118* 118*   MCV 106.1* 102.6*   RDW 17* 17*   Neutrophils 14.7  --    Segmented Neutrophils  --  14   Neutrophils Absolute 2.18  --    Lymphocytes Automated 83.4  --    Eosinophils Automated 0.8  --  Immature Granulocytes 0.1  --    Immature Granulocytes Absolute 0.02  --        Chem Review:  Recent  Labs   Lab 09/07/22  0657 09/06/22  0644 09/05/22  0758 09/04/22  1225   Sodium 139 139 140 140   Potassium 4.3 3.7 4.5 4.5   Chloride 98* 104 106 109   CO2 29 24 22 21    BUN 53.0* 55.0* 51.0* 46.0*   Creatinine 1.9* 2.0* 2.3* 2.0*   Glucose 106* 114* 165* 141*   Calcium 9.4 9.1 9.4 9.5   Magnesium 2.2 2.1  --  2.3   Phosphorus  --   --   --  4.0   Bilirubin, Total  --   --   --  2.4*   AST (SGOT)  --   --   --  16   ALT  --   --   --  14   Alkaline Phosphatase  --   --   --  69       Results       Procedure Component Value Units Date/Time    Glucose Whole Blood - POCT [[id number]]  (Abnormal) Collected: 09/07/22 1115     Updated: 09/07/22 1119     Whole Blood Glucose POCT 161 mg/dL     Magnesium [[id number]] Collected: 09/07/22 0657    Specimen: Blood Updated: 09/07/22 0755     Magnesium 2.2 mg/dL     Basic Metabolic Panel [[id number]]  (Abnormal) Collected: 09/07/22 0657    Specimen: Blood Updated: 09/07/22 0755     Glucose 106 mg/dL      BUN 08.6 mg/dL      Creatinine 1.9 mg/dL      Calcium 9.4 mg/dL      Sodium 578 mEq/L      Potassium 4.3 mEq/L      Chloride 98 mEq/L      CO2 29 mEq/L      Anion Gap 12.0     eGFR 36.2 mL/min/1.73 m2     Glucose Whole Blood - POCT [[id number]]  (Abnormal) Collected: 09/07/22 0725     Updated: 09/07/22 0729     Whole Blood Glucose POCT 136 mg/dL     Glucose Whole Blood - POCT [[id number]]  (Abnormal) Collected: 09/06/22 2015     Updated: 09/06/22 2019     Whole Blood Glucose POCT 203 mg/dL     Glucose Whole Blood - POCT [[id number]]  (Abnormal) Collected: 09/06/22 1655     Updated: 09/06/22 1659     Whole Blood Glucose POCT 225 mg/dL     Glucose Whole Blood - POCT [[id number]]  (Abnormal) Collected: 09/06/22 1546     Updated: 09/06/22 1552     Whole Blood Glucose POCT 197 mg/dL           Radiology reports have been reviewed:  Radiology Results (24 Hour)       ** No results found for the last 24 hours. **          Chest AP Portable    Result Date: 09/04/2022  HISTORY: Chest Pain  COMPARISON: 03/12/2022 FINDINGS: Left chest pacemaker/defibrillator. The lungs are clear. No pleural effusion or pneumothorax. Stable enlarged cardiac silhouette. No acute osseous abnormality. Bone island in the anterolateral left sixth rib.      No acute abnormality within the chest.  August Albino 09/04/2022 12:51 PM    Pathology:   Specimens (From admission, onward)      None  Last EKG Result       Procedure Component Value Units Date/Time    ECG 12 lead [[id number]] Collected: 09/04/22 1148     Updated: 09/04/22 1354     Ventricular Rate 89 BPM      Atrial Rate 89 BPM      P-R Interval 150 ms      QRS Duration 94 ms      Q-T Interval 416 ms      QTC Calculation (Bezet) 506 ms      P Axis 69 degrees      R Axis -5 degrees      T Axis 102 degrees      [facility] MUSE NARRATIVE AND IMPRESSION --     Atrial-sensed ventricular-paced rhythm OCCASIONAL PREMATURE VENTRICULAR COMPLEXES  ABNORMAL ECG  WHEN COMPARED WITH ECG OF 11-Mar-2022 23:07,  PREMATURE VENTRICULAR COMPLEXES ARE NOW PRESENT  VENTRICULAR RATE HAS INCREASED BY  12 BPM  Confirmed by Crimm, Hampton [phone]) on 09/04/2022 1:53:27 PM      Narrative:      Atrial-sensed ventricular-paced rhythm OCCASIONAL PREMATURE VENTRICULAR COMPLEXES  ABNORMAL ECG  WHEN COMPARED WITH ECG OF 11-Mar-2022 23:07,  PREMATURE VENTRICULAR COMPLEXES ARE NOW PRESENT  VENTRICULAR RATE HAS INCREASED BY  12 BPM  Confirmed by Crimm, Hampton ([id number]) on 09/04/2022 1:53:27 PM    ECG 12 lead (Electrocardiogram) [[id number]] Collected: 09/05/22 0535     Updated: 09/05/22 0536     Ventricular Rate 76 BPM      Atrial Rate 76 BPM      P-R Interval 160 ms      QRS Duration 88 ms      Q-T Interval 442 ms      QTC Calculation (Bezet) 497 ms      P Axis 72 degrees      R Axis 14 degrees      T Axis 109 degrees      IHS MUSE NARRATIVE AND IMPRESSION --     Atrial-sensed ventricular-paced rhythm  ABNORMAL ECG      Narrative:      Atrial-sensed ventricular-paced rhythm  ABNORMAL ECG    EKG SCAN [[id number]]  Resulted: 09/04/22 2348     Updated: 09/04/22 2348          Hospitalist   Signed by:   Krystal Eaton, MD  09/07/2022 1:11 PM  Time spent in evaluating and managing the care of the patient including face-to-face and non-face-to-face: 35 minutes    *This note was generated by the Epic EMR system/ Dragon speech recognition and may contain inherent errors or omissions not intended by the user. Grammatical errors, random word insertions, deletions, pronoun errors and incomplete sentences are occasional consequences of this technology due to software limitations. Not all errors are caught or corrected. If there are questions or concerns about the content of this note or information contained within the body of this dictation they should be addressed directly with the author for clarification

## 2022-09-07 NOTE — Plan of Care (Signed)
Problem: Pain interferes with ability to perform ADL  Goal: Pain at adequate level as identified by patient  Outcome: Progressing  Flowsheets (Taken 09/07/2022 1348)  Pain at adequate level as identified by patient:   Identify patient comfort function goal   Assess for risk of opioid induced respiratory depression, including snoring/sleep apnea. Alert healthcare team of risk factors identified.   Assess pain on admission, during daily assessment and/or before any "as needed" intervention(s)   Reassess pain within 30-60 minutes of any procedure/intervention, per Pain Assessment, Intervention, Reassessment (AIR) Cycle   Evaluate if patient comfort function goal is met   Offer non-pharmacological pain management interventions     Problem: Side Effects from Pain Analgesia  Goal: Patient will experience minimal side effects of analgesic therapy  Outcome: Progressing  Flowsheets (Taken 09/05/2022 2214 by Evelena Peat, RN)  Patient will experience minimal side effects of analgesic therapy:   Monitor/assess patient's respiratory status (RR depth, effort, breath sounds)   Prevent/manage side effects per LIP orders (i.e. nausea, vomiting, pruritus, constipation, urinary retention, etc.)   Evaluate for opioid-induced sedation with appropriate assessment tool (i.e. POSS)   Assess for changes in cognitive function     Problem: Moderate/High Fall Risk Score >5  Goal: Patient will remain free of falls  Outcome: Progressing  Flowsheets (Taken 09/07/2022 0727)  Moderate Risk (6-13):   MOD-Consider activation of bed alarm if appropriate   MOD-Apply bed exit alarm if patient is confused   MOD-Floor mat at bedside (where available) if appropriate     Problem: Day of Admission - Heart Failure  Goal: Heart Failure Admission  Outcome: Progressing     Problem: Everyday - Heart Failure  Goal: Stable Vital Signs and Fluid Balance  Outcome: Progressing  Flowsheets (Taken 09/06/2022 1431 by Joaquin Music, RN)  Stable Vital Signs and  Fluid Balance:   Daily Standing Weights in the morning using the same scale, after using the bathroom and before breadfast.  If unable to stand, zero the bed and use the bed scale   Monitor, assess vital signs and telemetry per policy   Monitor labs and report abnormalities to physician   Strict Intake/Output   Fluid Restriction   Assess for swelling/edema   Wean oxygen as needed if appropriate  Goal: Mobility/Activity is Maintained at Optimal Level for Patient  Outcome: Progressing  Flowsheets (Taken 09/06/2022 1431 by Joaquin Music, RN)  Mobility/Activity is Maintained at Optimal Level for Patient:   Perform active/passive ROM   Maintain SCD's as Ordered   Increase mobility as tolerated/progressive mobility protocol   Reposition patient every 2 hours and as needed unless able to reposition self   Consult with Physical Therapy and/or Occupational Therapy   Assess for changes in respiratory status, level of consciousness and/or development of fatigue  Goal: Nutritional Intake is Adequate  Outcome: Progressing  Flowsheets (Taken 09/06/2022 1431 by Joaquin Music, RN)  Nutritional Intake is Adequate:   Cardiac diet-2 gm Sodium   Consult/Collaborate with Nutritionist   Fluid Restricction if needed   Patient and family teaching on low sodium diet   Encourage/perform oral hygiene as appropriate  Goal: Teaching-Using CHF Warning Zones and Educational Videos  Outcome: Progressing  Flowsheets (Taken 09/06/2022 1431 by Joaquin Music, RN)  Teaching-Using CHF Warning Zones and Educational Videos:   Daily Standing Weights & record   CHF Warning Zones and when to call for help   Medications   Signs & Symptoms of CHF   Fluid Restriction if appropriate   Document  in the Education Tab in EPIC with Teach-back   Sodium Restriction     Problem: Compromised Tissue integrity  Goal: Damaged tissue is healing and protected  Outcome: Progressing  Flowsheets (Taken 09/07/2022 1348)  Damaged tissue is healing and protected:    Monitor/assess Braden scale every shift   Provide wound care per wound care algorithm   Reposition patient every 2 hours and as needed unless able to reposition self   Increase activity as tolerated/progressive mobility   Relieve pressure to bony prominences for patients at moderate and high risk   Avoid shearing injuries   Keep intact skin clean and dry   Use bath wipes, not soap and water, for daily bathing   Use incontinence wipes for cleaning urine, stool and caustic drainage. Foley care as needed   Monitor external devices/tubes for correct placement to prevent pressure, friction and shearing   Encourage use of lotion/moisturizer on skin   Monitor patient's hygiene practices  Goal: Nutritional status is improving  Outcome: Progressing  Flowsheets (Taken 09/07/2022 1348)  Nutritional status is improving:   Assist patient with eating   Allow adequate time for meals   Encourage patient to take dietary supplement(s) as ordered   Include patient/patient care companion in decisions related to nutrition   Collaborate with Clinical Nutritionist

## 2022-09-07 NOTE — Progress Notes (Addendum)
[facility] PROGRESS NOTE    [facility]  [facility] [address] PROGRESSIVE CARE  [address]  [place] [address]  Dept: [phone]  Loc: [phone]    Date Time: 09/07/22 10:58 AM  Patient Name: [name] [name] Day: 3    Subjective/Cardiac Relevant Events:   Neg 4084 cc since admit, neg 520 last 24 hours  Wt down 7 lbs from 11/19, down 14 lbs in 48 hours. Does not know DWG  Creat 2.0->1.9  Feels good. Denies SOB, CP, dizziness or palpitations  Per wife, pt desaturates at night. She reports that he had a Sleep Study 1 year ago and they never got results? 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 male with the following relevant diagnoses:    Acute on chronic systolic CHF. +orthopnea, LE edema, and weight gain   EF 25 to 30% by echo August 2022.  Moderate tricuspid and mitral regurgitation present.  PA pressures were 54 mmHg.   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 Renaee Munda          RECOMMENDATIONS:     Pt does not know his DWG but clinically looks compensated. He was 206# 1 month ago and is 193# today  Will transition IV diuretics to po to start this afternoon, he was on Torsemide 60 mg po BID as OP along with Metolazone 10 mg QD.   Observe response to po diuretic overnight with tentative plan for d/c in am if he has good UOP  On Eliquis 5 mg bid for acute DVT   No indication for ASA from cardiac standpoint  GDMT for CMP: Continue Valsartan 20 mg  daily, Continue  Toprol 12.5 mg daily  Daily I/Os  Daily standing weights  Pt will need to follow up with Sleep Medicine    Telemetry/Labs Reviewed/Intake-Output:     Telemetry reviewed: N/A    Recent Labs   Lab 09/04/22  1628 09/04/22  1225   hs Troponin-I 58.9* 66.2*   hs Troponin-I Delta -7.3  --                Recent Labs   Lab 09/04/22  1225   Bilirubin, Total 2.4*   Protein, Total 6.9   Albumin 3.9   ALT 14   AST (SGOT) 16       Recent Labs   Lab 09/07/22  0657   Magnesium 2.2       Recent Labs   Lab 09/04/22  1257   PT 17.1*   PT INR 1.5*   PTT 34       Recent Labs   Lab 09/05/22  0758 09/04/22  1225   WBC 14.86* 13.15*   Hgb 12.2* 12.6   Hematocrit 38.5 39.1   Platelets 118* 118*       Recent Labs   Lab 09/07/22  0657 09/06/22  0644 09/05/22  0758  Sodium 139 139 140   Potassium 4.3 3.7 4.5   Chloride 98* 104 106   CO2 29 24 22    BUN 53.0* 55.0* 51.0*   Creatinine 1.9* 2.0* 2.3*   eGFR 36.2* 34.0* 28.8*   Glucose 106* 114* 165*   Calcium 9.4 9.1 9.4           .  Lab Results   Component Value Date    BNP 11,362 (H) 09/04/2022          Intake/Output Summary (Last 24 hours) at 09/07/2022 1058  Last data filed at 09/07/2022 1050  Gross per 24 hour   Intake 1530 ml   Output 2050 ml   Net -520 ml         Medications:      Scheduled Meds: PRN Meds:    acalabrutinib Maleate, 100 mg, Oral, Q12H SCH  apixaban, 5 mg, Oral, Q12H [facility]  aspirin EC, 81 mg, Oral, Daily  atorvastatin, 40 mg, Oral, QHS  bumetanide, 2 mg, Intravenous, BID  empagliflozin, 10 mg, Oral, Daily  fluticasone furoate-vilanterol, 1 puff, Inhalation, QAM  insulin glargine, 32 Units, Subcutaneous, QHS  insulin lispro, 1-3 Units, Subcutaneous, QHS  insulin lispro, 1-5 Units, Subcutaneous, TID AC  metOLazone, 10 mg, Oral, Daily  metoprolol succinate XL, 12.5 mg, Oral, Daily  valsartan, 20 mg, Oral, Daily        Continuous Infusions:   albuterol-ipratropium, 3 mL, Q6H PRN  dextrose, 15 g of glucose, PRN   Or  dextrose, 12.5 g, PRN   Or  dextrose, 12.5 g, PRN   Or  glucagon  (rDNA), 1 mg, PRN          Physical Exam:     Vitals:    09/07/22 0727 09/07/22 0900 09/07/22 0922 09/07/22 1001   BP: 96/77 108/79  101/84   Pulse: 85   81   Resp: 18      Temp: 97.5 F (36.4 C)      TempSrc: Temporal      SpO2: 92%  95%    Weight:       Height:             08/04/2022     4:18 PM 09/04/2022     9:54 AM 09/04/2022    11:40 AM 09/04/2022     5:42 PM 09/05/2022     6:08 AM 09/06/2022     4:04 AM 09/07/2022     4:10 AM   Weight Monitoring   Height  182.9 cm 182.9 cm 181 cm      Height Method   Stated       Weight 94.257 kg 96.616 kg 96.616 kg 96.253 kg 94.53 kg 91.445 kg 88.179 kg   Weight Method   Standing Scale Standing Scale Standing Scale Standing Scale Standing Scale   BMI (calculated)  28.9 kg/m2 28.9 kg/m2 29.4 kg/m2          Constitutional: Cooperative, alert, no acute distress.  Neck:JVP normal  Cardiac: Regular rate and rhythm, normal S1 and S2; no S3 or S4, no murmurs, no rubs, no gallops.  Pulmonary: Clear to auscultation bilaterally, no wheezing, no rhonchi, no rales.  Extremities: No edema   Vascular: +2 pulses in radial artery bilaterally, 2+ pedal pulses bilaterally.    -------------------------------------------------------------------------------------  Signed by:         Johny Blamer, NP       Attestation:  I have seen the patient and performed the substantive portion of the  visit with my personal exam as below.    I have reviewed and updated the documented findings and plan of care accordingly.   - Continue current GDMT and diuretic dosing.  - Echocardiogram  - RHC tomorrow as evaluation for advanced therapies (holding apixaban).    Physical Exam  Constitutional: Cooperative, alert, no acute distress.  Neck:JVP normal  Cardiac: Regular rate and rhythm, normal S1 and S2; no S3 or S4, no murmurs, no rubs, no gallops.  Pulmonary: Clear to auscultation bilaterally, no wheezing, no rhonchi, no rales.  Extremities: No edema   Vascular: +2 pulses in radial artery bilaterally, 2+  pedal pulses bilaterally.    [name] Mould, MD          [facility] Contact Information   [facility]  Secure Chat (Group):   [facility] [place] [facility]    APP Spectralink:  [phone]    MD Spectralink :  [phone]  [phone]    After hours, non urgent consult line:  [phone]    After hours, physician on-call:  [phone] [facility]  Secure Chat (Group):   [facility] [place] [facility]    APP Spectralink:  [phone]    MD Spectralink :  [phone]      After hours, non urgent consult line:  [phone]    After hours, physician on-call:  [phone] [facility]  Secure Chat (Group):   [facility] [place] [facility]    APP Spectralink:  [phone]    MD Spectralink :  [phone]      After hours, non urgent consult line:  [phone]    After hours, physician on-call:  [phone] [facility]  Secure Chat (Group):   [facility] [place] [facility]    APP Spectralink:  [phone]    MD Spectralink :  [phone]      After hours, non urgent consult line:  [phone]    After hours, physician on-call:  [phone]       This note was generated by the Dragon speech recognition and may contain errors or omissions not intended by the user. Grammatical errors, random word insertions, deletions, pronoun errors, and incomplete sentences are occasional consequences of this technology due to software limitations. Not all errors are caught or corrected. If there are questions or concerns about the content of this note or information contained within the body of this dictation, they should be addressed directly with the author for clarification.

## 2022-09-07 NOTE — Progress Notes (Signed)
Chaplain Intern provided spiritual care through empathetic listening, affirmation, and prayer.   Please page chaplain if additional spirtual care support is needed/desired.    Rev. Lofton Leon  Chaplain Resident  Pager #[phone]

## 2022-09-07 NOTE — Consults (Addendum)
Advanced Heart Failure Consult Note  [facility]      ASSESSMENT:      [name] [name] is a 76 year old gentleman with a longstanding history of nonischemic cardiomyopathy currently admitted to the hospital for management of acute decompensated heart failure.    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 Started acalabrutinib in July 2023. Primary oncologist: Dr. Laurine Blazer.   Right lower extremity DVT (May 2023)  Type 2 diabetes, A1c 5.9%  Dyslipidemia, LDL 91     RECOMMENDATIONS:      Acute Decompensated Heart Failure    - Preload:   - Continue torsemide 60 mg BID  - Recommend cautious use of metolazone given that it can precipitate severe electrolyte derangements   - Afterload: valsartan 20 mg daily  - Beta blockade: metoprolol succinate 12.5 mg daily (would not increase until we have the RHC)  - MRA:   - SGLT2i:   - Testing:  - Please obtain a TTE   - Please obtain a right heart catheterization   - Other:  -Please consult hematology (see advanced therapies below)    Advanced Therapies    [name] [name] has multiple markers of advanced heart failure.  I would like to obtain a right heart catheterization for risk stratification.  In terms of advanced therapy options, the patient is not a candidate for orthotopic heart transplantation due to advanced age.  While a LVAD could be considered, additional information is needed regarding [name] [name] history of CLL and his current treatment regimen.  CLL could be prohibitive if the patient has a poor short-term prognosis or if his current treatment has significant side effects which could be problematic in LVAD patients (e.g. hemorrhage, hypercoagulability, thrombocytopenia,  infection).    ---------------------------------------------------------------------------------------------------------    Consult Requested by: [name] [name]    Chief Complaint/Reason for consultation: acute decompensated heart failure     History of Present Illness:      To briefly review, [name] [name] has a longstanding history of nonischemic cardiomyopathy.  Unfortunately, the patient has had a progressive intolerance to guideline directed medical therapy.  He is on low doses of GDMT in the outpatient setting and requiring high-dose loop diuretics and daily metolazone.  Patient presented to the hospital on November 17 with weight gain, dyspnea, orthopnea, lower extremity edema.  He was subsequently admitted for management of acute decompensated heart failure.    Today, [name] [name] is alone. Overall, [name] [name] tells me that his blood pressures have been low at baseline and that he becomes dyspneic with walking short distances. He cannot go up a flight of stairs without experiencing significant dyspnea.     Review of systems: All other systems were reviewed and are otherwise negative except as noted in the HPI.    Family history: Reviewed in EMR.     Social history: [name] lives with his wife in [place], [place]. He is a former Psychologist, forensic. He has adult children who live out of state. He does not use tobacco products.     Relevant home medications:  - Apixaban 5 mg BID  - ASA 81 mg daily  - Atorvastatin 40 mg daily  - Empagliflozin   - Insulin  - Metolazone 10 mg daily  - Metoprolol succinate 12.5  mg daily  - Torsemide 60 mg BID  - Valsartan 20 mg daily     Current medications:  acalabrutinib Maleate, 100 mg, Oral, Q12H SCH  apixaban, 5 mg, Oral, Q12H SCH  aspirin EC, 81 mg, Oral, Daily  atorvastatin, 40 mg, Oral, QHS  bumetanide, 2 mg, Intravenous, BID  empagliflozin, 10 mg, Oral, Daily  fluticasone furoate-vilanterol, 1 puff, Inhalation, QAM  insulin glargine, 32 Units,  Subcutaneous, QHS  insulin lispro, 1-3 Units, Subcutaneous, QHS  insulin lispro, 1-5 Units, Subcutaneous, TID AC  metOLazone, 10 mg, Oral, Daily  metoprolol succinate XL, 12.5 mg, Oral, Daily  valsartan, 20 mg, Oral, Daily      Physical exam:     Vital signs in last 24 hours:   Temp:  [97.2 F (36.2 C)-98 F (36.7 C)] 97.5 F (36.4 C)  Heart Rate:  [77-87] 81  Resp Rate:  [17-20] 18  BP: (96-111)/(73-84) 101/84  SpO2: 95 %O2 Device: None (Room air)  Height: 181 cm (5' 11.26")  Weight: 88.2 kg (194 lb 6.4 oz); Weight change: -3.266 kg (-7 lb 3.2 oz)  Body mass index is 26.92 kg/m..    I/O last 3 completed shifts:  In: 2162 [P.O.:2162]  Out: 5220 [Urine:5220]    General. The patient is is well-developed, well-nourished, comfortable, and of normal body habitus.   HEENT. Average dentition.    Neck. JVP measured at 13 cm at an angle of 45 degrees. No carotid bruits are present. Carotid pulses are equal bilaterally.   Chest wall. No surgical scars appreciated.   Heart. Regular rate and rhythm. S1 and S2 are normal. No murmurs, gallops, or rubs.   Lungs. Non-labored breathing. Lungs are clear to auscultation bilaterally.   Abdomen. Bowel sounds are present. The abdomen is soft and non-tender.   Extremities. No clubbing or cyanosis is appreciated. Warm and well perfused. There is no edema.   Neuro. The patient is alert, fully oriented, and cooperative. There are no gross motor or sensory deficits.     Labs:  Lab Results   Component Value Date    WBC 14.86 (H) 09/05/2022    HGB 12.2 (L) 09/05/2022    PLT 118 (L) 09/05/2022    NA 139 09/07/2022    K 4.3 09/07/2022    CREAT 1.9 (H) 09/07/2022    EGFR 36.2 (A) 09/07/2022    MG 2.2 09/07/2022    AST 16 09/04/2022    ALB 3.9 09/04/2022    LDL 91 08/06/2022     Cardiac studies:    SPECT (2017): Normal myocardial perfusion. LV EF 28%.     Echocardiogram (05/29/21): Dilated LV (LVIDd 6.9 cm) with EF of 31%. Normal RV size and function. Trace AI. Moderate MR. Moderate TR. RVSP 54  mmHg. Estimated RA pressure 8 mmHg.     Left heart catheterization with coronary angiography (2014): Reportedly negative.     ----------------------    Vania Rea, MD  Advanced Heart Failure/Transplant Cardiology and Cardiac Critical Care  [facility]

## 2022-09-07 NOTE — Consults (Signed)
Initial Case Management Assessment and Discharge Planning  [facility]   Patient Name: [name] [name], [name] [name]   Date of Birth 06-Oct-1946   Attending Physician: Krystal Eaton, MD   Primary Care Physician: Zorita Pang, MD   Length of Stay 3   Reason for Consult / Chief Complaint Acute on chronic right-sided heart failure         PCP: Zorita Pang, MD    Preferred Pharmacy @ D/C:      [facility] [phone] - [address], [place] - [address] AT [facility] [address]  [address]  [place] [address]  Phone: [phone] Fax: [phone]    [facility]  [address]  [place] [address]  Phone: [phone] Fax: [phone]    CM spoke with patient's wife Lajoyce Corners to complete initial discharge planning assessment. Demographics on face sheet were verified.    Healthcare Agent:   Self    Living Arrangements: Patient lives with his spouse in a one level home     Prior level of functioning:    - ADL's: independent    - Ambulation: independent     - Driving: independent     DME: None     Home or community services: None     Past/present rehab: [facility]     Support systems: Wife    Discharge Transportation: Wife    D/C Plan A: Home with family    D/C Plan B: Home with home health    BARRIERS TO DISCHARGE:  None identified at this time     Dolores Frame, [employer]  Social Work Case Therapist, art [facility]

## 2022-09-07 NOTE — Plan of Care (Signed)
Problem: Pain interferes with ability to perform ADL  Goal: Pain at adequate level as identified by patient  Outcome: Progressing  Flowsheets (Taken 09/07/2022 1348 by Rica Mast, RN)  Pain at adequate level as identified by patient:   Identify patient comfort function goal   Assess for risk of opioid induced respiratory depression, including snoring/sleep apnea. Alert healthcare team of risk factors identified.   Assess pain on admission, during daily assessment and/or before any "as needed" intervention(s)   Reassess pain within 30-60 minutes of any procedure/intervention, per Pain Assessment, Intervention, Reassessment (AIR) Cycle   Evaluate if patient comfort function goal is met   Offer non-pharmacological pain management interventions     Problem: Side Effects from Pain Analgesia  Goal: Patient will experience minimal side effects of analgesic therapy  Outcome: Progressing  Flowsheets (Taken 09/05/2022 2214)  Patient will experience minimal side effects of analgesic therapy:   Monitor/assess patient's respiratory status (RR depth, effort, breath sounds)   Prevent/manage side effects per LIP orders (i.e. nausea, vomiting, pruritus, constipation, urinary retention, etc.)   Evaluate for opioid-induced sedation with appropriate assessment tool (i.e. POSS)   Assess for changes in cognitive function     Problem: Moderate/High Fall Risk Score >5  Goal: Patient will remain free of falls  Outcome: Progressing  Flowsheets (Taken 09/07/2022 2000)  Moderate Risk (6-13):   MOD-Consider activation of bed alarm if appropriate   MOD-Floor mat at bedside (where available) if appropriate   MOD-Remain with patient during toileting   MOD-Perform dangle, stand, walk (DSW) prior to mobilization   MOD-include family in multidisciplinary POC discussions     Problem: Everyday - Heart Failure  Goal: Stable Vital Signs and Fluid Balance  Outcome: Progressing  Flowsheets (Taken 09/06/2022 1431 by Joaquin Music,  RN)  Stable Vital Signs and Fluid Balance:   Daily Standing Weights in the morning using the same scale, after using the bathroom and before breadfast.  If unable to stand, zero the bed and use the bed scale   Monitor, assess vital signs and telemetry per policy   Monitor labs and report abnormalities to physician   Strict Intake/Output   Fluid Restriction   Assess for swelling/edema   Wean oxygen as needed if appropriate  Goal: Mobility/Activity is Maintained at Optimal Level for Patient  Outcome: Progressing  Flowsheets (Taken 09/06/2022 1431 by Joaquin Music, RN)  Mobility/Activity is Maintained at Optimal Level for Patient:   Perform active/passive ROM   Maintain SCD's as Ordered   Increase mobility as tolerated/progressive mobility protocol   Reposition patient every 2 hours and as needed unless able to reposition self   Consult with Physical Therapy and/or Occupational Therapy   Assess for changes in respiratory status, level of consciousness and/or development of fatigue  Goal: Nutritional Intake is Adequate  Outcome: Progressing  Flowsheets (Taken 09/06/2022 1431 by Joaquin Music, RN)  Nutritional Intake is Adequate:   Cardiac diet-2 gm Sodium   Consult/Collaborate with Nutritionist   Fluid Restricction if needed   Patient and family teaching on low sodium diet   Encourage/perform oral hygiene as appropriate  Goal: Teaching-Using CHF Warning Zones and Educational Videos  Outcome: Progressing  Flowsheets (Taken 09/06/2022 1431 by Joaquin Music, RN)  Teaching-Using CHF Warning Zones and Educational Videos:   Daily Standing Weights & record   CHF Warning Zones and when to call for help   Medications   Signs & Symptoms of CHF   Fluid Restriction if appropriate   Document in the Education  Tab in EPIC with Teach-back   Sodium Restriction     Problem: Compromised Tissue integrity  Goal: Damaged tissue is healing and protected  Outcome: Progressing  Flowsheets (Taken 09/07/2022 2004)  Damaged tissue is healing  and protected:   Monitor/assess Braden scale every shift   Provide wound care per wound care algorithm   Reposition patient every 2 hours and as needed unless able to reposition self   Increase activity as tolerated/progressive mobility   Relieve pressure to bony prominences for patients at moderate and high risk   Avoid shearing injuries   Keep intact skin clean and dry

## 2022-09-08 ENCOUNTER — Inpatient Hospital Stay (HOSPITAL_COMMUNITY): Payer: Commercial Managed Care - POS

## 2022-09-08 ENCOUNTER — Inpatient Hospital Stay: Payer: Commercial Managed Care - POS

## 2022-09-08 DIAGNOSIS — I509 Heart failure, unspecified: Secondary | ICD-10-CM

## 2022-09-08 DIAGNOSIS — J449 Chronic obstructive pulmonary disease, unspecified: Secondary | ICD-10-CM

## 2022-09-08 LAB — ECHO ADULT TTE COMPLETE
AV Area (Cont Eq VTI): 2.16
AV Area (Cont Eq VTI): 2.164
AV Mean Gradient: 3
AV Peak Velocity: 1.06
Ao Root Diameter (2D): 3.2
BP Mod LV Ejection Fraction: 20.614
IVS Diastolic Thickness (2D): 0.8
LA Dimension (2D): 4.3
LA Volume Index (BP A-L): 41
LVID diastole (2D): 7
LVID systole (2D): 6.4
MV Area (PHT): 5.836
MV E/A: 1.87
MV E/A: 1.9
MV E/e' (Average): 14.052
Mitral Valve Findings: NORMAL
Pulmonary Valve Findings: NORMAL
RV Basal Diastolic Dimension: 3.7
RV Function: NORMAL
RV Systolic Pressure: 32.04
Site RA Size (AS): NORMAL
Site RV Size (AS): NORMAL
TAPSE: 1.63
Tricuspid Valve Findings: NORMAL

## 2022-09-08 LAB — HEPATIC FUNCTION PANEL
ALT: 12 U/L (ref 0–55)
AST (SGOT): 18 U/L (ref 5–41)
Albumin/Globulin Ratio: 1.2 (ref 0.9–2.2)
Albumin: 3.9 g/dL (ref 3.5–5.0)
Alkaline Phosphatase: 80 U/L (ref 37–117)
Bilirubin Direct: 0.7 mg/dL — ABNORMAL HIGH (ref 0.0–0.5)
Bilirubin Indirect: 1.2 mg/dL — ABNORMAL HIGH (ref 0.2–1.0)
Bilirubin, Total: 1.9 mg/dL — ABNORMAL HIGH (ref 0.2–1.2)
Globulin: 3.2 g/dL (ref 2.0–3.6)
Protein, Total: 7.1 g/dL (ref 6.0–8.3)

## 2022-09-08 LAB — GLUCOSE WHOLE BLOOD - POCT
Whole Blood Glucose POCT: 66 mg/dL — ABNORMAL LOW (ref 70–100)
Whole Blood Glucose POCT: 153 mg/dL — ABNORMAL HIGH (ref 70–100)
Whole Blood Glucose POCT: 114 mg/dL — ABNORMAL HIGH (ref 70–100)
Whole Blood Glucose POCT: 167 mg/dL — ABNORMAL HIGH (ref 70–100)
Whole Blood Glucose POCT: 54 mg/dL — ABNORMAL LOW (ref 70–100)
Whole Blood Glucose POCT: 106 mg/dL — ABNORMAL HIGH (ref 70–100)
Whole Blood Glucose POCT: 208 mg/dL — ABNORMAL HIGH (ref 70–100)
Whole Blood Glucose POCT: 243 mg/dL — ABNORMAL HIGH (ref 70–100)

## 2022-09-08 LAB — BASIC METABOLIC PANEL
Anion Gap: 12 (ref 5.0–15.0)
BUN: 55 mg/dL — ABNORMAL HIGH (ref 9.0–28.0)
CO2: 36 mEq/L — ABNORMAL HIGH (ref 17–29)
Calcium: 9.8 mg/dL (ref 7.9–10.2)
Chloride: 92 mEq/L — ABNORMAL LOW (ref 99–111)
Creatinine: 2.1 mg/dL — ABNORMAL HIGH (ref 0.5–1.5)
Glucose: 55 mg/dL — ABNORMAL LOW (ref 70–100)
Potassium: 3.4 mEq/L — ABNORMAL LOW (ref 3.5–5.3)
Sodium: 140 mEq/L (ref 135–145)
eGFR: 32.1 mL/min/{1.73_m2} — AB (ref >=60)

## 2022-09-08 LAB — MAGNESIUM: Magnesium: 2.3 mg/dL (ref 1.6–2.6)

## 2022-09-08 LAB — LACTIC ACID, PLASMA: Lactic Acid: 2 mmol/L (ref 0.2–2.0)

## 2022-09-08 MED ORDER — DEXTROSE-SODIUM CHLORIDE 5-0.9 % IV SOLN
INTRAVENOUS | Status: DC
Start: 2022-09-08 — End: 2022-09-08

## 2022-09-08 MED ORDER — DEXTROSE-SODIUM CHLORIDE 5-0.9 % IV SOLN
INTRAVENOUS | Status: DC
Start: 2022-09-09 — End: 2022-09-09

## 2022-09-08 MED ORDER — DEXTROSE 50 % IV SOLN
50.0000 mL | Freq: Once | INTRAVENOUS | Status: AC
Start: 2022-09-08 — End: 2022-09-08
  Administered 2022-09-08: 50 mL via INTRAVENOUS
  Filled 2022-09-08: qty 50

## 2022-09-08 MED ORDER — SULFUR HEXAFLUORIDE MICROSPH 60.7-25 MG IJ SUSR
5.0000 mL | Freq: Once | INTRAMUSCULAR | Status: AC
Start: 2022-09-08 — End: 2022-09-08
  Administered 2022-09-08: 5 mL via INTRAVENOUS

## 2022-09-08 MED ORDER — POTASSIUM CHLORIDE CRYS ER 20 MEQ PO TBCR
40.0000 meq | EXTENDED_RELEASE_TABLET | Freq: Two times a day (BID) | ORAL | Status: DC
Start: 2022-09-08 — End: 2022-09-09
  Administered 2022-09-08: 40 meq via ORAL
  Filled 2022-09-08: qty 2

## 2022-09-08 NOTE — Plan of Care (Signed)
Problem: Pain interferes with ability to perform ADL  Goal: Pain at adequate level as identified by patient  Outcome: Progressing  Flowsheets (Taken 09/07/2022 1348)  Pain at adequate level as identified by patient:   Identify patient comfort function goal   Assess for risk of opioid induced respiratory depression, including snoring/sleep apnea. Alert healthcare team of risk factors identified.   Assess pain on admission, during daily assessment and/or before any "as needed" intervention(s)   Reassess pain within 30-60 minutes of any procedure/intervention, per Pain Assessment, Intervention, Reassessment (AIR) Cycle   Evaluate if patient comfort function goal is met   Offer non-pharmacological pain management interventions     Problem: Side Effects from Pain Analgesia  Goal: Patient will experience minimal side effects of analgesic therapy  Outcome: Progressing  Flowsheets (Taken 09/05/2022 2214 by Evelena Peat, RN)  Patient will experience minimal side effects of analgesic therapy:   Monitor/assess patient's respiratory status (RR depth, effort, breath sounds)   Prevent/manage side effects per LIP orders (i.e. nausea, vomiting, pruritus, constipation, urinary retention, etc.)   Evaluate for opioid-induced sedation with appropriate assessment tool (i.e. POSS)   Assess for changes in cognitive function     Problem: Moderate/High Fall Risk Score >5  Goal: Patient will remain free of falls  Outcome: Progressing  Flowsheets (Taken 09/08/2022 0737)  Moderate Risk (6-13):   MOD-Consider activation of bed alarm if appropriate   MOD-Apply bed exit alarm if patient is confused   MOD-Floor mat at bedside (where available) if appropriate   MOD-Remain with patient during toileting   MOD-Perform dangle, stand, walk (DSW) prior to mobilization   MOD-include family in multidisciplinary POC discussions     Problem: Day of Admission - Heart Failure  Goal: Heart Failure Admission  Outcome: Progressing     Problem: Everyday -  Heart Failure  Goal: Stable Vital Signs and Fluid Balance  Outcome: Progressing  Flowsheets (Taken 09/06/2022 1431 by Joaquin Music, RN)  Stable Vital Signs and Fluid Balance:   Daily Standing Weights in the morning using the same scale, after using the bathroom and before breadfast.  If unable to stand, zero the bed and use the bed scale   Monitor, assess vital signs and telemetry per policy   Monitor labs and report abnormalities to physician   Strict Intake/Output   Fluid Restriction   Assess for swelling/edema   Wean oxygen as needed if appropriate  Goal: Mobility/Activity is Maintained at Optimal Level for Patient  Outcome: Progressing  Flowsheets (Taken 09/06/2022 1431 by Joaquin Music, RN)  Mobility/Activity is Maintained at Optimal Level for Patient:   Perform active/passive ROM   Maintain SCD's as Ordered   Increase mobility as tolerated/progressive mobility protocol   Reposition patient every 2 hours and as needed unless able to reposition self   Consult with Physical Therapy and/or Occupational Therapy   Assess for changes in respiratory status, level of consciousness and/or development of fatigue  Goal: Nutritional Intake is Adequate  Outcome: Progressing  Flowsheets (Taken 09/06/2022 1431 by Joaquin Music, RN)  Nutritional Intake is Adequate:   Cardiac diet-2 gm Sodium   Consult/Collaborate with Nutritionist   Fluid Restricction if needed   Patient and family teaching on low sodium diet   Encourage/perform oral hygiene as appropriate  Goal: Teaching-Using CHF Warning Zones and Educational Videos  Outcome: Progressing  Flowsheets (Taken 09/06/2022 1431 by Joaquin Music, RN)  Teaching-Using CHF Warning Zones and Educational Videos:   Daily Standing Weights & record   CHF Warning Zones  and when to call for help   Medications   Signs & Symptoms of CHF   Fluid Restriction if appropriate   Document in the Education Tab in EPIC with Teach-back   Sodium Restriction     Problem: Compromised Tissue  integrity  Goal: Damaged tissue is healing and protected  Outcome: Progressing  Flowsheets (Taken 09/08/2022 0726)  Damaged tissue is healing and protected:   Monitor/assess Braden scale every shift   Reposition patient every 2 hours and as needed unless able to reposition self   Increase activity as tolerated/progressive mobility   Relieve pressure to bony prominences for patients at moderate and high risk   Avoid shearing injuries   Keep intact skin clean and dry   Use bath wipes, not soap and water, for daily bathing   Encourage use of lotion/moisturizer on skin   Monitor patient's hygiene practices  Goal: Nutritional status is improving  Outcome: Progressing  Flowsheets (Taken 09/07/2022 2004 by Evelena Peat, RN)  Nutritional status is improving:   Allow adequate time for meals   Encourage patient to take dietary supplement(s) as ordered   Include patient/patient care companion in decisions related to nutrition

## 2022-09-08 NOTE — Consults (Signed)
[facility] HEMATOLOGY ONCOLOGY CONSULTATION    Date Time: 09/08/22 8:47 AM  Patient Name: [name] [name]  Requesting Physician: Deloris Ping, MD      Reason for Consultation:   I was asked to see [name] [name] at the request of [facility]  for recommendations regarding CLL/ treatment.      Assessment:   [name] [name] is a 76 y.o. male who presents to the hospital on 09/04/2022 with:  Principal Problem:    Acute on chronic right-sided heart failure  Active Problems:    Cardiomyopathy  Cardiomyopathy/chronic lymphocytic leukemia    Recommendations:     Cardiac work-up as per cardiologist.    There is an increased risk of bleeding with BTK inhibitors and anticoagulants/ antiplatelet agents. However patient has been tolerating acalabrutinib with Eliquis and low-dose aspirin well.  This risk is higher with older agents like ibrutinib but can be seen with other agents as this is a class effect.   ( Risk of bleeding associated with BTK inhibitor monotherapy: a systematic review and meta-analysis of randomized controlled trials  Carolee Rota 1 2 3  4, Governor Specking 1 2 3, Dwain Sarna 1 2 3  4, Fei Dong 5, Margarita Mail )        Atrial fibrillation is another possible side effect.  If it is felt that acalabrutinib may be contributing to any adverse effects it is okay from my perspective to hold it.  (Cardiotoxicity in patients treated with acalabrutinib  Petra Langerbeins  [employer]: Check for Updates  Blood (2022) 140 (20): 2096-2097.  [email or web address]-[email or web address]    I will order noncontrast CT chest abdomen pelvis soft tissue neck to have a new baseline staging work-up.      Written recommendations were provided  at time of evaluation.   I appreciate the opportunity to participate in the care of this interesting and pleasant patient.        History:   [name] [name] is a 76 y.o. male who presents to the hospital on 09/04/2022  With progressive shortness of breath.   He has history of cardiomyopathy and has been admitted for further work-up evaluation and management of the same.    Patient was referred to me in September 2021 with a high white count.  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  And followed up without intervention.  He was readmitted to the hospital for cardiac reasons and during those evaluation in May 2023Was found to have extensive adenopathy including pelvic lymph nodes measuring more than 7 cm.  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  His white count also was rapidly increasing and in June 2023 he was started on acalabrutinib.  Because the risk of tumor lysis he was also treated with allopurinol.  Developed skin rash.  After stopping allopurinol that rash resolved and he was restarted on acalabrutinib and has been tolerating it well with a white count coming down gradually.    He has no B symptoms    Past Medical History:     Past Medical History:   Diagnosis Date    Acute CHF     NOV  & DEC 2018, - DIALYSIS CATH PLACED Aug 24 2017 TO REMOVE FLUID     Acute systolic (congestive) heart failure 08/2017    Anxiety     CAD (coronary artery disease)     Cardiomyopathy     nonischemic    CHF (congestive heart failure) 08/12/2014, 2013    Chronic obstructive  pulmonary disease     POSSIBLE PER PT HE USES  SYNBICORT BID ABD  BREO INHALER PRN    Coronary artery disease 2011    CORONARY STENT PLACEMENT FOLLOWED BY [facility]    Depression     echocardiogram 02/2016, 11/2016, 09/2017, 12/20/2017    GERD (gastroesophageal reflux disease)     Heart attack 2011    and 2014    Hyperlipemia     Hypertension     ICD (implantable 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     PLACED 2014  [facility]  LAST INTERROGATION April 01 2018 REPORT REQUESTED    Ischemic cardiomyopathy     EF 15% ON ECHO 09-20-2017 DR GARG IN EPIC    Nonischemic cardiomyopathy     NSTEMI (non-ST elevated myocardial infarction)     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    Pacemaker 2014    MEDTRONIC ICD/PACEMAKER COMBO LAST INTERROGATION   12-12 2018    Pneumonia 09/2016    Primary cardiomyopathy     Ischemic cardiomyopathy EF 15% ON ECHO 09-20-2017 DR GARG IN EPIC    Sepsis 08/2017    Syncope and collapse     PRIOR TO ICD/PACEMAKER PLACED 2014    Type 2 diabetes mellitus, controlled DX 1998    BS  AVG 100  A1C 7.4 Dec 27 2017    [facility]     PT SEE HIS PMD April 01 2018 RE THIS-TOLD TO SEE PMD BY [facility] JUNE 12 WHEN THEY SAW HIM FOR A CL       Past Surgical History:     Past Surgical History:   Procedure Laterality Date    BIV  11/10/2016    ICD METRONIC  UPGRADE     CARDIAC CATHETERIZATION  03/2010    CORONARY STENT PLACEMENT X 2 PER PT; LM normal, LAD 70-80% distal lesion at apex, 20% lesion mid CFX    CARDIAC CATHETERIZATION  12/2012    CARDIAC DEFIBRILLATOR PLACEMENT  12/2012    [facility]    CARDIAC PACEMAKER PLACEMENT  2014    MEDTR[employer]IC ICD/PACEMAKER PLACED  HEART    CIRCUMCISION  AGE 89    COLONOSCOPY  2009    CORRECTION HAMMER TOE      duodenal ulcer  1973    STRESS RELATED IN SCHOOL    ECHOCARDIOGRAM, TRANSTHORACIC  02/2016 11/2016 09/2017 12/2017    ECHOCARDIOGRAM, TRANSTHORACIC  02/2016,11/2016,12/20/2017    EGD N/A 08/15/2014    Procedure: EGD;  Surgeon: Colon Branch, MD;  Location: [facility];  Service: Gastroenterology;  Laterality: N/A;  egd w/ bx    EGD  1975    EGD, COLONOSCOPY N/A 04/04/2018    Procedure: EGD with bxs, COLONOSCOPY with polypectomy and clipping;  Surgeon: Doyne Keel, MD;  Location: [facility];  Service: Gastroenterology;  Laterality: N/A;    FRACTURE SURGERY  AGE 64     RT  ANKLE- CAST APPLIED    HERNIA REPAIR  AGE 89    LEFT INGUINAL HERNIA    MPI nuclear study  06/2016    orthopedic surgery  AGE 27    RT foot corrective - HAMMERTOE    TONSILLECTOMY AND ADENOIDECTOMY  1954       Family History:     Family History   Problem Relation Age of Onset    Breast cancer Mother     Heart attack Mother 1    Hypertension Mother     Diabetes Mother     Coronary artery  disease Mother     Diabetes Brother      Heart attack Father     Hypertension Father        Social History:     Social History     Socioeconomic History    Marital status: Married     Spouse name: Teacher, music    Number of children: 0    Years of education: Not on file    Highest education level: Not on file   Occupational History    Occupation: Runner, 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, [place]    Tobacco Use    Smoking status: Never    Smokeless tobacco: Never   Vaping Use    Vaping Use: Never used   Substance and Sexual Activity    Alcohol use: Not Currently    Drug use: No    Sexual activity: Yes     Partners: Female   Other Topics Concern    Not on file   Social History Narrative    Not on file     Social Determinants of Health     Financial Resource Strain: Not on file   Food Insecurity: No Food Insecurity (09/04/2022)    Hunger Vital Sign     Worried About Running Out of Food in the Last Year: Never true     Ran Out of Food in the Last Year: Never true   Transportation Needs: No Transportation Needs (09/04/2022)    PRAPARE - Therapist, art (Medical): No     Lack of Transportation (Non-Medical): No   Physical Activity: Not on file   Stress: Not on file   Social Connections: Not on file   Intimate Partner Violence: Not At Risk (09/04/2022)    Humiliation, Afraid, Rape, and Kick questionnaire     Fear of Current or Ex-Partner: No     Emotionally Abused: No     Physically Abused: No     Sexually Abused: No   Housing Stability: Low Risk  (09/04/2022)    Housing Stability Vital Sign     Unable to Pay for Housing in the Last Year: No     Number of Places Lived in the Last Year: 1     Unstable Housing in the Last Year: No       Allergies:     Allergies   Allergen Reactions    Allopurinol        Medications:     Current Facility-Administered Medications   Medication Dose Route Frequency    acalabrutinib Maleate  100 mg Oral Q12H SCH    [Held by provider] apixaban  5 mg Oral Q12H [facility]    aspirin EC  81 mg Oral Daily    atorvastatin  40 mg Oral QHS     empagliflozin  10 mg Oral Daily    fluticasone furoate-vilanterol  1 puff Inhalation QAM    insulin glargine  32 Units Subcutaneous QHS    insulin lispro  1-3 Units Subcutaneous QHS    insulin lispro  1-5 Units Subcutaneous TID AC    metoprolol succinate XL  12.5 mg Oral Daily    torsemide  60 mg Oral BID    valsartan  20 mg Oral Daily       Review of Systems:   A complete twelve system review was done and was negative except for as documented in the history of present illness.      Physical Exam:     Vitals:  09/08/22 0731   BP: 94/68   Pulse: 88   Resp: 17   Temp: 97.1 F (36.2 C)   SpO2: 97%       General condition: Good  HEENT:   Opx clear.   No mucositis.   Lymph nodes: No significant lymphadenopathy  Chest: Clear to auscultation  Abdomen: No organomegaly, mass, distention or tenderness  Extremities:  edema  Skin: No rash  Neurologic: Alert, oriented, strength grossly normal    Labs:     Recent Labs   Lab 09/05/22  0758 09/04/22  1225   WBC 14.86* 13.15*   Hgb 12.2* 12.6   Hematocrit 38.5 39.1   Platelets 118* 118*     Results       Procedure Component Value Units Date/Time    Magnesium [[id number]] Collected: 09/08/22 0743    Specimen: Blood Updated: 09/08/22 0839     Magnesium 2.3 mg/dL     Basic Metabolic Panel [[id number]]  (Abnormal) Collected: 09/08/22 0743    Specimen: Blood Updated: 09/08/22 0839     Glucose 55 mg/dL      BUN 41.3 mg/dL      Creatinine 2.1 mg/dL      Calcium 9.8 mg/dL      Sodium 244 mEq/L      Potassium 3.4 mEq/L      Chloride 92 mEq/L      CO2 36 mEq/L      Anion Gap 12.0     eGFR 32.1 mL/min/1.73 m2     Glucose Whole Blood - POCT [[id number]]  (Abnormal) Collected: 09/08/22 0729     Updated: 09/08/22 0734     Whole Blood Glucose POCT 66 mg/dL     IgG, Quantitative [[id number]] Collected: 09/07/22 1433    Specimen: Blood Updated: 09/07/22 2130     Immunoglobulin G 1,067 mg/dL     Glucose Whole Blood - POCT [[id number]]  (Abnormal) Collected: 09/07/22 1957     Updated: 09/07/22 2007      Whole Blood Glucose POCT 254 mg/dL     Glucose Whole Blood - POCT [[id number]]  (Abnormal) Collected: 09/07/22 1558     Updated: 09/07/22 1603     Whole Blood Glucose POCT 134 mg/dL     Lactate dehydrogenase [[id number]] Collected: 09/07/22 1433    Specimen: Blood Updated: 09/07/22 1547     LDH 190 U/L     PT/APTT [[id number]]  (Abnormal) Collected: 09/07/22 1433     Updated: 09/07/22 1459     PT 17.4 sec      PT INR 1.5     PTT 38 sec     Fibrinogen [[id number]] Collected: 09/07/22 1433    Specimen: Blood Updated: 09/07/22 1459     Fibrinogen 402 mg/dL     Blood Flow Cytometry Diagnostic - Monitoring, Leukemia/Lymphoma [[id number]] Collected: 09/07/22 1433     Updated: 09/07/22 1433    Narrative:      FLOW CYTOMETRY: DIAGNOSTIC/MONITORING->Diagnostic  Please collect Lavender tube    Glucose Whole Blood - POCT [[id number]]  (Abnormal) Collected: 09/07/22 1115     Updated: 09/07/22 1119     Whole Blood Glucose POCT 161 mg/dL               Rads:     Radiology Results (24 Hour)       ** No results found for the last 24 hours. **              Marvell Fuller, MD, MD  [facility]  [address], [address]  [place], [place] [address]

## 2022-09-08 NOTE — Progress Notes (Signed)
[facility] HOSPITALIST Progress Note  Patient Info:   Date/Time: 09/08/2022 / 7:47 AM   Admit Date:09/04/2022  Patient Name:[name] [name]   AOZ:[id number]   PCP: Zorita Pang, MD  Attending Physician:Conn Trombetta, Olen Pel, MD  Assessment/Plan:     76 y/o M with NICM admitted now for acute decompensated heart failure.  Unfortunately, the patient has had a progressive intolerance to guideline directed medical therapy.  He is on low doses of GDMT in the outpatient setting and requiring high-dose loop diuretics and daily metolazone.  Patient presented to the hospital on November 17 with weight gain, dyspnea, orthopnea, lower extremity edema.     Acute on chronic systolic heart failure  Non-ischemic cardiomyopathy,    -[employer] BiV-IC present  - Continue Torsemide 60 mg BID , Valsartan 20 mg daily, Toprol XL 12.5 mg, spironolactone and Empagliflozin  - Appreciate eval by Advanced HF team - Mr. Brill has multiple markers of advanced heart failure.  Dr Ninfa Linden suggested right heart catheterization for risk stratification and then possible transfer to [facility] based on his hemodynamics.  But since the right heart cath was canceled today, we are planning to send him over to [facility] so that he can get the right heart cath at [facility] and further discussions there as well.  Dr. Ninfa Linden already discussed with Dr. Marlowe Sax at [facility].   - In terms of advanced therapy options, the patient is not a candidate for orthotopic heart transplantation due to advanced age.  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 hemorrhage, hypercoagulability, thrombocytopenia, infection).  Echo reviewed   -   * Markedly dilated left ventricle with severely reduced systolic function (LVEDVi 109 mL/m2, ejection fraction 21%).    * Hypertrabeculated left ventricle with severe global  hypokinesis.    * Mild aortic valve and moderate mitral valve regurgitation    * Wire in right-sided chambers with mild to moderate (2+) peri-device tricuspid valve regurgitation.    * Dilated, poorly responsive IVC.    * When visually compared with the prior study dated 05/29/2021, LV function is less vigorous (previous EF 31%, now 21%; previous LVOT VTI 17 cm, now 11 cm); valvular regurgitation is less prominent.  - Dr Ninfa Linden educated him about the severity of his condition and conveyed to him that his short-term prognosis from heart failure is poor. informed Mr. Bendickson that at this juncture in his care, it may be best to consider advanced heart failure therapies. Unfortunately, Mr. Zajicek is not a candidate for heart transplantation due to advanced age. However, Mr. Doggett may be a candidate for a destination left ventricular assist device. Dr Ninfa Linden briefly reviewed this device with Mr. Rosenow and his wife (e.g. explained how it works, showed him pictures). Mr. Gervasi expressed that his goal is to live longer and have a meaningful quality of life. Therefore, he is open to undergoing a LVAD evaluation if it is clinically indicated and could help him achieve these goals. Mr. Diliberto understands that this evaluation may require him to be transferred to [facility] and he is okay with this.     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 However patient has been tolerating acalabrutinib with Eliquis and low-dose aspirin well.  -Repeat CT  scan to allow for staging of CLL and prognostication from hematology; this condition may be too prohibitive for the patient to receive a LVAD   - He has no B symptoms    Coronary artery disease nonocclusive , last cardiac catheterization in 2014 negative . 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   - Continue Lipitor     COPD   Sleep apnea  - stable      Updated wife at the bedside      DVT Prophylaxis:Medication VTE Prophylaxis Orders: apixaban (ELIQUIS) tablet 5 mg,   Mechanical VTE Prophylaxis Orders: Maintain sequential compression device   Central Line/Foley Catheter/PICC line status: None  Code Status: Full Code  Disposition:Planning to discharge - TBD  Type of Admission:Inpatient  Hospital Problems:     Active Hospital Problems    Diagnosis    Acute on chronic right-sided heart failure    Cardiomyopathy     Subjective:   Chief Complaint:  Shortness of Breath    09/08/22   ROS  Breathing better now   No Chest pain or palpitations   Objective:     Vitals:    09/07/22 1953 09/08/22 0006 09/08/22 0351 09/08/22 0731   BP: 101/71 99/70 93/64  94/68   Pulse: 85 86 83 88   Resp: 16 18 17 17    Temp: 97.4 F (36.3 C) 97.8 F (36.6 C) 97.8 F (36.6 C) 97.1 F (36.2 C)   TempSrc: Temporal Temporal Temporal Temporal   SpO2: 96% 94% 95% 97%   Weight:   85.1 kg (187 lb 9.6 oz)    Height:         Physical Exam:   Physical Exam  Heart - S1, S2 heard.  No murmur  Lungs - bilateral air entry present, no wheeze or rales  Abdomen - soft, bowel sounds normal, nondistended, no tenderness  Central nervous system - no focal deficits  Musculoskeletal - no edema   Current Medications      Scheduled Meds: PRN Meds:    acalabrutinib Maleate, 100 mg, Oral, Q12H SCH  [Held by provider] apixaban, 5 mg, Oral, Q12H SCH  aspirin EC, 81 mg, Oral, Daily  atorvastatin, 40 mg, Oral, QHS  dextrose, 50 mL, Intravenous, Once  empagliflozin, 10 mg, Oral, Daily  fluticasone furoate-vilanterol, 1 puff, Inhalation, QAM  insulin glargine, 32 Units, Subcutaneous,  QHS  insulin lispro, 1-3 Units, Subcutaneous, QHS  insulin lispro, 1-5 Units, Subcutaneous, TID AC  metoprolol succinate XL, 12.5 mg, Oral, Daily  torsemide, 60 mg, Oral, BID  valsartan, 20 mg, Oral, Daily        Continuous Infusions:   albuterol-ipratropium, 3 mL, Q6H PRN  dextrose, 15 g of glucose, PRN   Or  dextrose, 12.5 g, PRN   Or  dextrose, 12.5 g, PRN   Or  glucagon (rDNA), 1 mg, PRN          Results of Labs/imaging   Labs for the last 24 hours have been reviewed:   Coagulation Profile:   Recent Labs   Lab 09/07/22  1433   PT  17.4*   PT INR 1.5*   PTT 38       CBC review:   Recent Labs   Lab 09/05/22  0758 09/04/22  1225   WBC 14.86* 13.15*   Hgb 12.2* 12.6   Hematocrit 38.5 39.1   Platelets 118* 118*   MCV 106.1* 102.6*   RDW 17* 17*   Neutrophils 14.7  --    Segmented Neutrophils  --  14   Neutrophils Absolute 2.18  --    Lymphocytes Automated 83.4  --    Eosinophils Automated 0.8  --    Immature Granulocytes 0.1  --    Immature Granulocytes Absolute 0.02  --      Chem Review:  Recent Labs   Lab 09/07/22  0657 09/06/22  0644 09/05/22  0758 09/04/22  1225   Sodium 139 139 140 140   Potassium 4.3 3.7 4.5 4.5   Chloride 98* 104 106 109   CO2 29 24 22 21    BUN 53.0* 55.0* 51.0* 46.0*   Creatinine 1.9* 2.0* 2.3* 2.0*   Glucose 106* 114* 165* 141*   Calcium 9.4 9.1 9.4 9.5   Magnesium 2.2 2.1  --  2.3   Phosphorus  --   --   --  4.0   Bilirubin, Total  --   --   --  2.4*   AST (SGOT)  --   --   --  16   ALT  --   --   --  14   Alkaline Phosphatase  --   --   --  69     Results       Procedure Component Value Units Date/Time    Basic Metabolic Panel [[id number]] Collected: 09/08/22 0743    Specimen: Blood Updated: 09/08/22 0745    Magnesium [[id number]] Collected: 09/08/22 0743    Specimen: Blood Updated: 09/08/22 0745    Glucose Whole Blood - POCT [[id number]]  (Abnormal) Collected: 09/08/22 0729     Updated: 09/08/22 0734     Whole Blood Glucose POCT 66 mg/dL     IgG, Quantitative [[id number]] Collected: 09/07/22  1433    Specimen: Blood Updated: 09/07/22 2130     Immunoglobulin G 1,067 mg/dL     Glucose Whole Blood - POCT [[id number]]  (Abnormal) Collected: 09/07/22 1957     Updated: 09/07/22 2007     Whole Blood Glucose POCT 254 mg/dL     Glucose Whole Blood - POCT [[id number]]  (Abnormal) Collected: 09/07/22 1558     Updated: 09/07/22 1603     Whole Blood Glucose POCT 134 mg/dL     Lactate dehydrogenase [[id number]] Collected: 09/07/22 1433    Specimen: Blood Updated: 09/07/22 1547     LDH 190 U/L     PT/APTT [[id number]]  (Abnormal) Collected: 09/07/22 1433     Updated: 09/07/22 1459     PT 17.4 sec      PT INR 1.5     PTT 38 sec     Fibrinogen [[id number]] Collected: 09/07/22 1433    Specimen: Blood Updated: 09/07/22 1459     Fibrinogen 402 mg/dL     Blood Flow Cytometry Diagnostic - Monitoring, Leukemia/Lymphoma [[id number]] Collected: 09/07/22 1433     Updated: 09/07/22 1433    Narrative:      FLOW CYTOMETRY: DIAGNOSTIC/MONITORING->Diagnostic  Please collect Lavender tube    Glucose Whole Blood - POCT [[id number]]  (Abnormal) Collected: 09/07/22 1115     Updated: 09/07/22 1119     Whole  Blood Glucose POCT 161 mg/dL     Magnesium [[id number]] Collected: 09/07/22 0657    Specimen: Blood Updated: 09/07/22 0755     Magnesium 2.2 mg/dL     Basic Metabolic Panel [[id number]]  (Abnormal) Collected: 09/07/22 0657    Specimen: Blood Updated: 09/07/22 0755     Glucose 106 mg/dL      BUN 87.8 mg/dL      Creatinine 1.9 mg/dL      Calcium 9.4 mg/dL      Sodium 676 mEq/L      Potassium 4.3 mEq/L      Chloride 98 mEq/L      CO2 29 mEq/L      Anion Gap 12.0     eGFR 36.2 mL/min/1.73 m2           Radiology reports have been reviewed:  Radiology Results (24 Hour)       ** No results found for the last 24 hours. **          Chest AP Portable    Result Date: 09/04/2022  HISTORY: Chest Pain COMPARISON: 03/12/2022 FINDINGS: Left chest pacemaker/defibrillator. The lungs are clear. No pleural effusion or pneumothorax. Stable enlarged cardiac  silhouette. No acute osseous abnormality. Bone island in the anterolateral left sixth rib.      No acute abnormality within the chest.  August Albino 09/04/2022 12:51 PM    Pathology:   Specimens (From admission, onward)      None          Last EKG Result       Procedure Component Value Units Date/Time    ECG 12 lead [[id number]] Collected: 09/04/22 1148     Updated: 09/04/22 1354     Ventricular Rate 89 BPM      Atrial Rate 89 BPM      P-R Interval 150 ms      QRS Duration 94 ms      Q-T Interval 416 ms      QTC Calculation (Bezet) 506 ms      P Axis 69 degrees      R Axis -5 degrees      T Axis 102 degrees      IHS MUSE NARRATIVE AND IMPRESSION --     Atrial-sensed ventricular-paced rhythm OCCASIONAL PREMATURE VENTRICULAR COMPLEXES  ABNORMAL ECG  WHEN COMPARED WITH ECG OF 11-Mar-2022 23:07,  PREMATURE VENTRICULAR COMPLEXES ARE NOW PRESENT  VENTRICULAR RATE HAS INCREASED BY  12 BPM  Confirmed by Crimm, Hampton [phone]) on 09/04/2022 1:53:27 PM      Narrative:      Atrial-sensed ventricular-paced rhythm OCCASIONAL PREMATURE VENTRICULAR COMPLEXES  ABNORMAL ECG  WHEN COMPARED WITH ECG OF 11-Mar-2022 23:07,  PREMATURE VENTRICULAR COMPLEXES ARE NOW PRESENT  VENTRICULAR RATE HAS INCREASED BY  12 BPM  Confirmed by Crimm, Hampton ([id number]) on 09/04/2022 1:53:27 PM    ECG 12 lead (Electrocardiogram) [[id number]] Collected: 09/06/22 0621     Updated: 09/06/22 1233     Ventricular Rate 68 BPM      Atrial Rate 68 BPM      P-R Interval 160 ms      QRS Duration 92 ms      Q-T Interval 438 ms      QTC Calculation (Bezet) 465 ms      P Axis 55 degrees      R Axis 0 degrees      T Axis 118 degrees      IHS MUSE NARRATIVE AND IMPRESSION --  Atrial-sensed ventricular-paced rhythm OCCASIONAL PREMATURE VENTRICULAR COMPLEXES  ABNORMAL ECG    NO SIGNIFICANT CHANGE WAS FOUND  Confirmed by LUY MD, JEFFREY ([id number]) on 09/06/2022 12:33:13 PM      Narrative:      Atrial-sensed ventricular-paced rhythm OCCASIONAL PREMATURE VENTRICULAR COMPLEXES  ABNORMAL  ECG    NO SIGNIFICANT CHANGE WAS FOUND  Confirmed by LUY MD, JEFFREY ([id number]) on 09/06/2022 12:33:13 PM    ECG 12 lead (Electrocardiogram) [[id number]] Collected: 09/05/22 0535     Updated: 09/05/22 1247     Ventricular Rate 76 BPM      Atrial Rate 76 BPM      P-R Interval 160 ms      QRS Duration 88 ms      Q-T Interval 442 ms      QTC Calculation (Bezet) 497 ms      P Axis 72 degrees      R Axis 14 degrees      T Axis 109 degrees      IHS MUSE NARRATIVE AND IMPRESSION --     Atrial-sensed ventricular-paced rhythm  ABNORMAL ECG    pvcs no l onger present  Confirmed by LUY MD, JEFFREY ([id number]) on 09/05/2022 12:47:42 PM      Narrative:      Raenette Rover ventricular-paced rhythm  ABNORMAL ECG    pvcs no l onger present  Confirmed by LUY MD, JEFFREY ([id number]) on 09/05/2022 12:47:42 PM    EKG SCAN [[id number]] Resulted: 09/04/22 2348     Updated: 09/04/22 2348          Hospitalist   Signed by:   Deloris Ping, MD  09/08/2022 7:47 AM  Time spent in evaluating and managing the care of the patient including face-to-face and non-face-to-face:  55 min    *This note was generated by the Epic EMR system/ Dragon speech recognition and may contain inherent errors or omissions not intended by the user. Grammatical errors, random word insertions, deletions, pronoun errors and incomplete sentences are occasional consequences of this technology due to software limitations. Not all errors are caught or corrected. If there are questions or concerns about the content of this note or information contained within the body of this dictation they should be addressed directly with the author for clarification

## 2022-09-08 NOTE — ACP (Advance Care Planning) (Addendum)
To briefly review, [name] has a longstanding history of non-ischemic cardiomyopathy. [name] has several markers of advanced heart failure:    NYHA Class IIIB/IV symptoms  End-organ dysfunction (cardiorenal syndrome)  Low EF  Hospitalizations   Escalating diuretic doses (requiring daily metolazone)  Low baseline blood pressures limiting the use of guideline directed medical therapy  A gradual intolerance to guideline directed medical therapy leading to the reduction in doses or cessation of several medications    I spoke to [name] and his wife at length today. I educated him about the severity of his condition and conveyed to him that his short-term prognosis from heart failure is poor. I informed [name] that at this juncture in his care, it may be best to consider advanced heart failure therapies. Unfortunately, [name] is not a candidate for heart transplantation due to advanced age. However, [name] may be a candidate for a destination left ventricular assist device. I briefly reviewed this device with [name] and his wife (e.g. explained how it works, showed him pictures). [name] expressed to me that his goal is to live longer and have a meaningful quality of life. Therefore, he is open to undergoing a LVAD evaluation if it is clinically indicated and could help him achieve these goals. [name] understands that this evaluation may require him to be transferred to [facility] and he is okay with this.     Plan:    Obtain invasive hemodynamics  Repeat CT scan to allow for staging of CLL and prognostication from hematology; this condition may be too prohibitive for the patient to receive a LVAD     Pending this information, I will discuss the potential transfer of [name] to [facility] for further management.     - - - - - - - -     Vania Rea, MD  Advanced Heart Failure/Transplant Cardiology and Cardiac Critical Care  [facility]

## 2022-09-08 NOTE — Progress Notes (Signed)
H&P UPDATE WITH ASA/MALLAMPATTI    Date Time: 09/08/22 8:03 AM    PROCEDURE:    Left heart cath, possible percutaneous coronary intervention, Right heart cath  INDICATIONS:    congestive heart failure  H&P:    The history and physical including past medical, family, and social history were reviewed   and there are no significant interval changes from what is currently available in the chart  from prior evaluation. He has no complaints.  He was seen and examined by me prior   to the procedure.   ALLERGIES:    Allopurinol   LABS:      Lab Results   Component Value Date    WBC 14.86 (H) 09/05/2022    HGB 12.2 (L) 09/05/2022    HCT 38.5 09/05/2022    PLT 118 (L) 09/05/2022    NA 139 09/07/2022    K 4.3 09/07/2022    CL 98 (L) 09/07/2022    CO2 29 09/07/2022    MG 2.2 09/07/2022    BUN 53.0 (H) 09/07/2022    CREAT 1.9 (H) 09/07/2022    EGFR 36.2 (A) 09/07/2022    GLU 106 (H) 09/07/2022       ASA PHYSICAL STATUS    Class 3 - Severe systemic disease, limits normal activity but not incapacitating  MALLAMPATTI AIRWAY CLASSIFICATION    Class II: Visibility of hard and soft palate, upper portion of tonsils and uvula  PLANNED SEDATION:    ( ) NO SEDATION   (x) MODERATE SEDATION   ( ) DEEP SEDATION WITH ANESTHESIA   CONCLUSION:    The risks, benefits and alternatives of the procedure have been discussed in detail and   he has indicated that he understands the procedure, indications, and risks inherent to the   procedure and is amenable to proceeding.  All questions were answered. Informed   consent was signed and verified.      Signed by: Exie Parody, MD

## 2022-09-08 NOTE — Plan of Care (Signed)
Problem: Pain interferes with ability to perform ADL  Goal: Pain at adequate level as identified by patient  Outcome: Progressing  Flowsheets (Taken 09/07/2022 1348 by Rica Mast, RN)  Pain at adequate level as identified by patient:   Identify patient comfort function goal   Assess for risk of opioid induced respiratory depression, including snoring/sleep apnea. Alert healthcare team of risk factors identified.   Assess pain on admission, during daily assessment and/or before any "as needed" intervention(s)   Reassess pain within 30-60 minutes of any procedure/intervention, per Pain Assessment, Intervention, Reassessment (AIR) Cycle   Evaluate if patient comfort function goal is met   Offer non-pharmacological pain management interventions     Problem: Side Effects from Pain Analgesia  Goal: Patient will experience minimal side effects of analgesic therapy  Outcome: Progressing  Flowsheets (Taken 09/05/2022 2214 by Evelena Peat, RN)  Patient will experience minimal side effects of analgesic therapy:   Monitor/assess patient's respiratory status (RR depth, effort, breath sounds)   Prevent/manage side effects per LIP orders (i.e. nausea, vomiting, pruritus, constipation, urinary retention, etc.)   Evaluate for opioid-induced sedation with appropriate assessment tool (i.e. POSS)   Assess for changes in cognitive function     Problem: Moderate/High Fall Risk Score >5  Goal: Patient will remain free of falls  Outcome: Progressing  Flowsheets (Taken 09/08/2022 0737 by Rica Mast, RN)  Moderate Risk (6-13):   MOD-Consider activation of bed alarm if appropriate   MOD-Apply bed exit alarm if patient is confused   MOD-Floor mat at bedside (where available) if appropriate   MOD-Remain with patient during toileting   MOD-Perform dangle, stand, walk (DSW) prior to mobilization   MOD-include family in multidisciplinary POC discussions     Problem: Everyday - Heart Failure  Goal: Stable Vital Signs  and Fluid Balance  Outcome: Progressing  Flowsheets (Taken 09/06/2022 1431 by Joaquin Music, RN)  Stable Vital Signs and Fluid Balance:   Daily Standing Weights in the morning using the same scale, after using the bathroom and before breadfast.  If unable to stand, zero the bed and use the bed scale   Monitor, assess vital signs and telemetry per policy   Monitor labs and report abnormalities to physician   Strict Intake/Output   Fluid Restriction   Assess for swelling/edema   Wean oxygen as needed if appropriate  Goal: Mobility/Activity is Maintained at Optimal Level for Patient  Outcome: Progressing  Flowsheets (Taken 09/06/2022 1431 by Joaquin Music, RN)  Mobility/Activity is Maintained at Optimal Level for Patient:   Perform active/passive ROM   Maintain SCD's as Ordered   Increase mobility as tolerated/progressive mobility protocol   Reposition patient every 2 hours and as needed unless able to reposition self   Consult with Physical Therapy and/or Occupational Therapy   Assess for changes in respiratory status, level of consciousness and/or development of fatigue  Goal: Nutritional Intake is Adequate  Outcome: Progressing  Flowsheets (Taken 09/06/2022 1431 by Joaquin Music, RN)  Nutritional Intake is Adequate:   Cardiac diet-2 gm Sodium   Consult/Collaborate with Nutritionist   Fluid Restricction if needed   Patient and family teaching on low sodium diet   Encourage/perform oral hygiene as appropriate  Goal: Teaching-Using CHF Warning Zones and Educational Videos  Outcome: Progressing  Flowsheets (Taken 09/06/2022 1431 by Joaquin Music, RN)  Teaching-Using CHF Warning Zones and Educational Videos:   Daily Standing Weights & record   CHF Warning Zones and when to call for help   Medications  Signs & Symptoms of CHF   Fluid Restriction if appropriate   Document in the Education Tab in EPIC with Teach-back   Sodium Restriction

## 2022-09-08 NOTE — Consults (Addendum)
Advanced Heart Failure Consult Note  [facility]      ASSESSMENT:      Mr. Kuhnert is a 76 year old gentleman with a longstanding history of nonischemic cardiomyopathy currently admitted to the hospital for management of acute decompensated heart failure.    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 Started acalabrutinib in July 2023. Primary oncologist: Dr. Laurine Blazer.   Right lower extremity DVT (May 2023)  Type 2 diabetes, A1c 5.9%  Dyslipidemia, LDL 91     RECOMMENDATIONS:      Acute Decompensated Heart Failure    - Preload:   - Continue torsemide 60 mg BID  - Recommend cautious use of metolazone given that it can precipitate severe electrolyte derangements   - Afterload: valsartan 20 mg daily  - Beta blockade: metoprolol succinate 12.5 mg daily (would not increase until we have the RHC)  - MRA: start spironolactone 12.5 mg daily   - SGLT2i: empagliflozin 10 mg daily   - Testing:  - TTE scheduled for today   - RHC scheduled for today     Advanced Therapies    Please see my advanced care planning note from 09/08/22.     Mr. Klas has multiple markers of advanced heart failure. It would be best to risk stratify him with a right heart catheterization. In terms of advanced therapy options, the patient is not a candidate for orthotopic heart transplantation due to advanced age.  While a LVAD could be considered, additional information is needed regarding Mr. Vilinda Blanks history of CLL and his current treatment regimen. Specifically, hematology must answer the following questions:    What is the patient's expected survival from CLL?  What are some potential treatment side effects? Specifically, side-effects that could be problematic in the LVAD population  include: bleeding, hypercoagulability, thrombocytopenia, or infection.     Disposition    This patient may ultimately require transfer to [facility] for further evaluation and management.  However, it would be best to obtain the following information before we move the patient to [place]:    Invasive hemodynamics  Repeat CT scan to allow for staging of CLL   Input from hematology regarding the questions listed above     ---------------------------------------------------------------------------------------------------------    Consult Requested by: [facility]    Chief Complaint/Reason for consultation: acute decompensated heart failure     Interval Events:  - Awaiting RHC    Social history: Mr. Pharo lives with his wife in [place], [place]. He is a former Psychologist, forensic. He has adult children who live out of state. He does not use tobacco products.     Relevant home medications:  - Apixaban 5 mg BID  - ASA 81 mg daily  - Atorvastatin 40 mg daily  - Empagliflozin   - Insulin  - Metolazone 10 mg daily  - Metoprolol succinate 12.5 mg daily  - Torsemide 60 mg BID  - Valsartan 20 mg daily     Current medications:  acalabrutinib Maleate, 100 mg, Oral, Q12H SCH  [Held by provider] apixaban, 5 mg, Oral, Q12H SCH  aspirin EC, 81 mg, Oral, Daily  atorvastatin, 40 mg, Oral, QHS  empagliflozin, 10 mg, Oral, Daily  fluticasone furoate-vilanterol, 1 puff, Inhalation, QAM  insulin glargine, 32 Units, Subcutaneous, QHS  insulin lispro, 1-3 Units, Subcutaneous, QHS  insulin lispro, 1-5 Units, Subcutaneous, TID AC  metoprolol succinate XL, 12.5 mg, Oral, Daily  torsemide, 60 mg, Oral, BID  valsartan, 20 mg, Oral, Daily      Physical exam:     Vital signs in last 24 hours:   Temp:  [97.1 F (36.2 C)-97.8 F (36.6 C)] 97.1 F (36.2 C)  Heart Rate:  [76-88] 82  Resp Rate:  [16-18] 16  BP: (93-101)/(64-84) 94/68  SpO2: 97 %O2 Device: None (Room air)  Height: 181 cm (5' 11.26")  Weight: 85.1 kg (187 lb 9.6  oz); Weight change: -3.084 kg (-6 lb 12.8 oz)  Body mass index is 25.97 kg/m..    I/O last 3 completed shifts:  In: 2042 [P.O.:2042]  Out: 4450 [Urine:4450]    General. The patient is is well-developed, well-nourished, comfortable, and of normal body habitus.   HEENT. Average dentition.    Neck. JVP measured at 13 cm at an angle of 45 degrees. No carotid bruits are present. Carotid pulses are equal bilaterally.   Chest wall. No surgical scars appreciated.   Heart. Regular rate and rhythm. S1 and S2 are normal. No murmurs, gallops, or rubs.   Lungs. Non-labored breathing. Lungs are clear to auscultation bilaterally.   Abdomen. Bowel sounds are present. The abdomen is soft and non-tender.   Extremities. No clubbing or cyanosis is appreciated. Warm and well perfused. There is no edema.   Neuro. The patient is alert, fully oriented, and cooperative. There are no gross motor or sensory deficits.     Labs:  Lab Results   Component Value Date    WBC 14.86 (H) 09/05/2022    HGB 12.2 (L) 09/05/2022    PLT 118 (L) 09/05/2022    NA 140 09/08/2022    K 3.4 (L) 09/08/2022    CREAT 2.1 (H) 09/08/2022    EGFR 32.1 (A) 09/08/2022    MG 2.3 09/08/2022    AST 16 09/04/2022    ALB 3.9 09/04/2022    LDL 91 08/06/2022     Cardiac studies:    SPECT (2017): Normal myocardial perfusion. LV EF 28%.     Echocardiogram (05/29/21): Dilated LV (LVIDd 6.9 cm) with EF of 31%. Normal RV size and function. Trace AI. Moderate MR. Moderate TR. RVSP 54 mmHg. Estimated RA pressure 8 mmHg.     Left heart catheterization with coronary angiography (2014): Reportedly negative.     ----------------------    Vania Rea, MD  Advanced Heart Failure/Transplant Cardiology and Cardiac Critical Care  [facility]

## 2022-09-08 NOTE — Progress Notes (Signed)
Chaplain provided [name] [name] emotional and spiritual support through blessings and a ministry of presence.     Carmin Richmond  Volunteer Chaplain  **Page Chaplain 24/7 at [phone]**

## 2022-09-09 ENCOUNTER — Encounter
Admission: AD | Disposition: A | Discharge status: Home / Self Care | Payer: Self-pay | Source: Other Acute Inpatient Hospital | Attending: Internal Medicine

## 2022-09-09 ENCOUNTER — Inpatient Hospital Stay
Admission: AD | Admit: 2022-09-09 | Payer: Commercial Managed Care - POS | Source: Other Acute Inpatient Hospital | Admitting: Hospitalist

## 2022-09-09 ENCOUNTER — Inpatient Hospital Stay
Admission: AD | Admit: 2022-09-09 | Discharge: 2022-09-14 | DRG: 286 | Disposition: A | Discharge status: Home / Self Care | Payer: Commercial Managed Care - POS | Source: Other Acute Inpatient Hospital | Attending: Internal Medicine | Admitting: Internal Medicine

## 2022-09-09 DIAGNOSIS — E663 Overweight: Secondary | ICD-10-CM | POA: Diagnosis present

## 2022-09-09 DIAGNOSIS — I959 Hypotension, unspecified: Secondary | ICD-10-CM | POA: Diagnosis present

## 2022-09-09 DIAGNOSIS — Z7901 Long term (current) use of anticoagulants: Secondary | ICD-10-CM

## 2022-09-09 DIAGNOSIS — C9110 Chronic lymphocytic leukemia of B-cell type not having achieved remission: Secondary | ICD-10-CM | POA: Diagnosis present

## 2022-09-09 DIAGNOSIS — Z95810 Presence of automatic (implantable) cardiac defibrillator: Secondary | ICD-10-CM

## 2022-09-09 DIAGNOSIS — Z794 Long term (current) use of insulin: Secondary | ICD-10-CM

## 2022-09-09 DIAGNOSIS — I5043 Acute on chronic combined systolic (congestive) and diastolic (congestive) heart failure: Secondary | ICD-10-CM

## 2022-09-09 DIAGNOSIS — E7800 Pure hypercholesterolemia, unspecified: Secondary | ICD-10-CM | POA: Diagnosis present

## 2022-09-09 DIAGNOSIS — I493 Ventricular premature depolarization: Secondary | ICD-10-CM | POA: Diagnosis present

## 2022-09-09 DIAGNOSIS — I420 Dilated cardiomyopathy: Secondary | ICD-10-CM | POA: Diagnosis present

## 2022-09-09 DIAGNOSIS — E1122 Type 2 diabetes mellitus with diabetic chronic kidney disease: Secondary | ICD-10-CM | POA: Diagnosis present

## 2022-09-09 DIAGNOSIS — I130 Hypertensive heart and chronic kidney disease with heart failure and stage 1 through stage 4 chronic kidney disease, or unspecified chronic kidney disease: Principal | ICD-10-CM | POA: Diagnosis present

## 2022-09-09 DIAGNOSIS — Z79899 Other long term (current) drug therapy: Secondary | ICD-10-CM

## 2022-09-09 DIAGNOSIS — G4733 Obstructive sleep apnea (adult) (pediatric): Secondary | ICD-10-CM | POA: Diagnosis present

## 2022-09-09 DIAGNOSIS — I5042 Chronic combined systolic (congestive) and diastolic (congestive) heart failure: Secondary | ICD-10-CM

## 2022-09-09 DIAGNOSIS — I5023 Acute on chronic systolic (congestive) heart failure: Secondary | ICD-10-CM | POA: Diagnosis present

## 2022-09-09 DIAGNOSIS — I472 Ventricular tachycardia, unspecified: Secondary | ICD-10-CM | POA: Diagnosis present

## 2022-09-09 DIAGNOSIS — R570 Cardiogenic shock: Secondary | ICD-10-CM

## 2022-09-09 DIAGNOSIS — N179 Acute kidney failure, unspecified: Secondary | ICD-10-CM | POA: Diagnosis present

## 2022-09-09 DIAGNOSIS — J449 Chronic obstructive pulmonary disease, unspecified: Secondary | ICD-10-CM | POA: Diagnosis present

## 2022-09-09 DIAGNOSIS — I2510 Atherosclerotic heart disease of native coronary artery without angina pectoris: Secondary | ICD-10-CM | POA: Diagnosis present

## 2022-09-09 DIAGNOSIS — I255 Ischemic cardiomyopathy: Principal | ICD-10-CM | POA: Diagnosis present

## 2022-09-09 DIAGNOSIS — Z6825 Body mass index (BMI) 25.0-25.9, adult: Secondary | ICD-10-CM

## 2022-09-09 DIAGNOSIS — E872 Acidosis, unspecified: Secondary | ICD-10-CM | POA: Diagnosis present

## 2022-09-09 DIAGNOSIS — I428 Other cardiomyopathies: Secondary | ICD-10-CM | POA: Diagnosis present

## 2022-09-09 DIAGNOSIS — I509 Heart failure, unspecified: Secondary | ICD-10-CM

## 2022-09-09 DIAGNOSIS — Z86718 Personal history of other venous thrombosis and embolism: Secondary | ICD-10-CM

## 2022-09-09 DIAGNOSIS — I081 Rheumatic disorders of both mitral and tricuspid valves: Secondary | ICD-10-CM | POA: Diagnosis present

## 2022-09-09 DIAGNOSIS — T50996A Underdosing of other drugs, medicaments and biological substances, initial encounter: Secondary | ICD-10-CM | POA: Diagnosis present

## 2022-09-09 DIAGNOSIS — E876 Hypokalemia: Secondary | ICD-10-CM | POA: Diagnosis present

## 2022-09-09 DIAGNOSIS — Z7984 Long term (current) use of oral hypoglycemic drugs: Secondary | ICD-10-CM

## 2022-09-09 DIAGNOSIS — I6523 Occlusion and stenosis of bilateral carotid arteries: Secondary | ICD-10-CM | POA: Diagnosis present

## 2022-09-09 DIAGNOSIS — F32A Depression, unspecified: Secondary | ICD-10-CM

## 2022-09-09 DIAGNOSIS — F419 Anxiety disorder, unspecified: Secondary | ICD-10-CM

## 2022-09-09 DIAGNOSIS — I252 Old myocardial infarction: Secondary | ICD-10-CM

## 2022-09-09 DIAGNOSIS — Z7982 Long term (current) use of aspirin: Secondary | ICD-10-CM

## 2022-09-09 DIAGNOSIS — N1832 Chronic kidney disease, stage 3b: Secondary | ICD-10-CM | POA: Diagnosis present

## 2022-09-09 DIAGNOSIS — Z91128 Patient's intentional underdosing of medication regimen for other reason: Secondary | ICD-10-CM

## 2022-09-09 DIAGNOSIS — E875 Hyperkalemia: Secondary | ICD-10-CM | POA: Diagnosis not present

## 2022-09-09 HISTORY — PX: RIGHT HEART CATH: CATH22

## 2022-09-09 LAB — HEPATIC FUNCTION PANEL
ALT: 14 U/L (ref 0–55)
AST (SGOT): 18 U/L (ref 5–41)
Albumin/Globulin Ratio: 1.2 (ref 0.9–2.2)
Albumin: 3.9 g/dL (ref 3.5–5.0)
Alkaline Phosphatase: 98 U/L (ref 37–117)
Bilirubin Direct: 0.7 mg/dL — ABNORMAL HIGH (ref 0.0–0.5)
Bilirubin Indirect: 1.5 mg/dL — ABNORMAL HIGH (ref 0.2–1.0)
Bilirubin, Total: 2.2 mg/dL — ABNORMAL HIGH (ref 0.2–1.2)
Globulin: 3.3 g/dL (ref 2.0–3.6)
Protein, Total: 7.2 g/dL (ref 6.0–8.3)

## 2022-09-09 LAB — CBC
Absolute NRBC: 0 10*3/uL (ref 0.00–0.00)
Hematocrit: 41.6 % (ref 37.6–49.6)
Hgb: 14.3 g/dL (ref 12.5–17.1)
MCH: 33 pg (ref 25.1–33.5)
MCHC: 34.4 g/dL (ref 31.5–35.8)
MCV: 96.1 fL — ABNORMAL HIGH (ref 78.0–96.0)
MPV: 11.8 fL (ref 8.9–12.5)
Nucleated RBC: 0 /100 WBC (ref 0.0–0.0)
Platelets: 170 10*3/uL (ref 142–346)
RBC: 4.33 10*6/uL (ref 4.20–5.90)
RDW: 15 % (ref 11–15)
WBC: 11.99 10*3/uL — ABNORMAL HIGH (ref 3.10–9.50)

## 2022-09-09 LAB — GLUCOSE WHOLE BLOOD - POCT
Whole Blood Glucose POCT: 211 mg/dL — ABNORMAL HIGH (ref 70–100)
Whole Blood Glucose POCT: 196 mg/dL — ABNORMAL HIGH (ref 70–100)
Whole Blood Glucose POCT: 263 mg/dL — ABNORMAL HIGH (ref 70–100)

## 2022-09-09 LAB — BASIC METABOLIC PANEL
Anion Gap: 15 (ref 5.0–15.0)
BUN: 58 mg/dL — ABNORMAL HIGH (ref 9.0–28.0)
CO2: 31 mEq/L — ABNORMAL HIGH (ref 17–29)
Calcium: 9.5 mg/dL (ref 7.9–10.2)
Chloride: 89 mEq/L — ABNORMAL LOW (ref 99–111)
Creatinine: 2.3 mg/dL — ABNORMAL HIGH (ref 0.5–1.5)
Glucose: 196 mg/dL — ABNORMAL HIGH (ref 70–100)
Potassium: 3.7 mEq/L (ref 3.5–5.3)
Sodium: 135 mEq/L (ref 135–145)
eGFR: 28.8 mL/min/{1.73_m2} — AB (ref >=60)

## 2022-09-09 LAB — LAB USE ONLY - HISTORICAL NON-GYN MEDICAL CYTOLOGY

## 2022-09-09 SURGERY — RIGHT HEART CATH
Anesthesia: Conscious Sedation | Laterality: Right

## 2022-09-09 MED ORDER — ACETAMINOPHEN 325 MG PO TABS
650.0000 mg | ORAL_TABLET | Freq: Four times a day (QID) | ORAL | Status: DC | PRN
Start: 2022-09-09 — End: 2022-09-14
  Administered 2022-09-09 – 2022-09-12 (×7): 650 mg via ORAL
  Filled 2022-09-09 (×7): qty 2

## 2022-09-09 MED ORDER — INSULIN LISPRO 100 UNIT/ML SOLN (WRAP)
6.0000 [IU] | Freq: Three times a day (TID) | Status: DC
Start: 2022-09-09 — End: 2022-09-14
  Administered 2022-09-09 – 2022-09-14 (×14): 6 [IU] via SUBCUTANEOUS
  Filled 2022-09-09 (×11): qty 18

## 2022-09-09 MED ORDER — VALSARTAN 40 MG PO TABS
20.0000 mg | ORAL_TABLET | Freq: Every day | ORAL | Status: DC
Start: 2022-09-10 — End: 2022-09-09

## 2022-09-09 MED ORDER — DEXTROSE 10 % IV BOLUS
12.5000 g | INTRAVENOUS | Status: DC | PRN
Start: 2022-09-09 — End: 2022-09-09

## 2022-09-09 MED ORDER — GLUCOSE 40 % PO GEL (WRAP)
15.0000 g | ORAL | Status: DC | PRN
Start: 2022-09-09 — End: 2022-09-14

## 2022-09-09 MED ORDER — ACALABRUTINIB 100 MG PO CAPS
100.0000 mg | ORAL_CAPSULE | Freq: Two times a day (BID) | ORAL | Status: DC
Start: 2022-09-09 — End: 2022-09-14
  Administered 2022-09-09 – 2022-09-14 (×10): 100 mg via ORAL

## 2022-09-09 MED ORDER — CARBOXYMETHYLCELLULOSE SOD PF 0.5 % OP SOLN
1.0000 [drp] | Freq: Three times a day (TID) | OPHTHALMIC | Status: DC | PRN
Start: 2022-09-09 — End: 2022-09-14

## 2022-09-09 MED ORDER — INSULIN LISPRO 100 UNIT/ML SOLN (WRAP)
1.0000 [IU] | Freq: Three times a day (TID) | Status: DC
Start: 2022-09-09 — End: 2022-09-14
  Administered 2022-09-10: 3 [IU] via SUBCUTANEOUS
  Administered 2022-09-10: 5 [IU] via SUBCUTANEOUS
  Administered 2022-09-11: 3 [IU] via SUBCUTANEOUS
  Administered 2022-09-11: 7 [IU] via SUBCUTANEOUS
  Administered 2022-09-11 – 2022-09-12 (×2): 1 [IU] via SUBCUTANEOUS
  Administered 2022-09-12: 3 [IU] via SUBCUTANEOUS
  Administered 2022-09-12 – 2022-09-13 (×2): 5 [IU] via SUBCUTANEOUS
  Administered 2022-09-13: 7 [IU] via SUBCUTANEOUS
  Administered 2022-09-13: 1 [IU] via SUBCUTANEOUS
  Administered 2022-09-14: 5 [IU] via SUBCUTANEOUS
  Filled 2022-09-09: qty 9
  Filled 2022-09-09 (×2): qty 3
  Filled 2022-09-09: qty 9

## 2022-09-09 MED ORDER — MAGNESIUM SULFATE IN D5W 1-5 GM/100ML-% IV SOLN
1.0000 g | INTRAVENOUS | Status: DC | PRN
Start: 2022-09-09 — End: 2022-09-09

## 2022-09-09 MED ORDER — GLUCOSE 40 % PO GEL (WRAP)
15.0000 g | ORAL | Status: DC | PRN
Start: 2022-09-09 — End: 2022-09-09

## 2022-09-09 MED ORDER — DEXTROSE 10 % IV BOLUS
12.5000 g | INTRAVENOUS | Status: DC | PRN
Start: 2022-09-09 — End: 2022-09-14

## 2022-09-09 MED ORDER — FLUTICASONE FUROATE-VILANTEROL 100-25 MCG/ACT IN AEPB
1.0000 | INHALATION_SPRAY | Freq: Every morning | RESPIRATORY_TRACT | Status: DC
Start: 2022-09-10 — End: 2022-09-12
  Administered 2022-09-10 – 2022-09-12 (×3): 1 via RESPIRATORY_TRACT
  Filled 2022-09-09: qty 14

## 2022-09-09 MED ORDER — INSULIN LISPRO 100 UNIT/ML SOLN (WRAP)
1.0000 [IU] | Freq: Every evening | Status: DC
Start: 2022-09-09 — End: 2022-09-14
  Administered 2022-09-09: 3 [IU] via SUBCUTANEOUS
  Administered 2022-09-10: 1 [IU] via SUBCUTANEOUS
  Administered 2022-09-11: 2 [IU] via SUBCUTANEOUS
  Administered 2022-09-12 – 2022-09-13 (×2): 3 [IU] via SUBCUTANEOUS
  Filled 2022-09-09 (×2): qty 9
  Filled 2022-09-09 (×2): qty 3
  Filled 2022-09-09: qty 6

## 2022-09-09 MED ORDER — TORSEMIDE 20 MG PO TABS
60.0000 mg | ORAL_TABLET | Freq: Two times a day (BID) | ORAL | Status: DC
Start: 2022-09-10 — End: 2022-09-12
  Administered 2022-09-10 – 2022-09-12 (×5): 60 mg via ORAL
  Filled 2022-09-09 (×5): qty 3

## 2022-09-09 MED ORDER — SALINE SPRAY 0.65 % NA SOLN
2.0000 | NASAL | Status: DC | PRN
Start: 2022-09-09 — End: 2022-09-14

## 2022-09-09 MED ORDER — GLUCAGON 1 MG IJ SOLR (WRAP)
1.0000 mg | INTRAMUSCULAR | Status: DC | PRN
Start: 2022-09-09 — End: 2022-09-14

## 2022-09-09 MED ORDER — INSULIN GLARGINE 100 UNIT/ML SC SOLN
18.0000 [IU] | Freq: Every evening | SUBCUTANEOUS | Status: DC
Start: 2022-09-09 — End: 2022-09-14
  Administered 2022-09-09 – 2022-09-13 (×5): 18 [IU] via SUBCUTANEOUS
  Filled 2022-09-09 (×5): qty 18

## 2022-09-09 MED ORDER — FENTANYL CITRATE (PF) 50 MCG/ML IJ SOLN (WRAP)
INTRAMUSCULAR | Status: AC | PRN
Start: 2022-09-09 — End: 2022-09-09
  Administered 2022-09-09: 50 ug via INTRAVENOUS

## 2022-09-09 MED ORDER — MELATONIN 3 MG PO TABS
3.0000 mg | ORAL_TABLET | Freq: Every evening | ORAL | Status: DC | PRN
Start: 2022-09-09 — End: 2022-09-09

## 2022-09-09 MED ORDER — POTASSIUM CHLORIDE 10 MEQ/100ML IV SOLN
10.0000 meq | INTRAVENOUS | Status: DC | PRN
Start: 2022-09-09 — End: 2022-09-09

## 2022-09-09 MED ORDER — ISOSORBIDE DINITRATE 10 MG PO TABS
10.0000 mg | ORAL_TABLET | Freq: Three times a day (TID) | ORAL | Status: DC
Start: 2022-09-09 — End: 2022-09-14
  Administered 2022-09-09 – 2022-09-14 (×15): 10 mg via ORAL
  Filled 2022-09-09 (×16): qty 1

## 2022-09-09 MED ORDER — HYDRALAZINE HCL 10 MG PO TABS
10.0000 mg | ORAL_TABLET | Freq: Three times a day (TID) | ORAL | Status: DC
Start: 2022-09-09 — End: 2022-09-13
  Administered 2022-09-09 – 2022-09-13 (×10): 10 mg via ORAL
  Filled 2022-09-09 (×11): qty 1

## 2022-09-09 MED ORDER — MIDAZOLAM HCL 1 MG/ML IJ SOLN (WRAP)
INTRAMUSCULAR | Status: AC | PRN
Start: 2022-09-09 — End: 2022-09-09
  Administered 2022-09-09: 1 mg via INTRAVENOUS

## 2022-09-09 MED ORDER — ATORVASTATIN CALCIUM 40 MG PO TABS
40.0000 mg | ORAL_TABLET | Freq: Every evening | ORAL | Status: DC
Start: 2022-09-09 — End: 2022-09-09

## 2022-09-09 MED ORDER — GLUCAGON 1 MG IJ SOLR (WRAP)
1.0000 mg | INTRAMUSCULAR | Status: DC | PRN
Start: 2022-09-09 — End: 2022-09-09

## 2022-09-09 MED ORDER — POTASSIUM & SODIUM PHOSPHATES 280-160-250 MG PO PACK
2.0000 | PACK | ORAL | Status: DC | PRN
Start: 2022-09-09 — End: 2022-09-09

## 2022-09-09 MED ORDER — NALOXONE HCL 0.4 MG/ML IJ SOLN (WRAP)
0.2000 mg | INTRAMUSCULAR | Status: DC | PRN
Start: 2022-09-09 — End: 2022-09-14

## 2022-09-09 MED ORDER — NITROGLYCERIN IN D5W 200-5 MCG/ML-% IV SOLN VIAL
INTRAVENOUS | Status: AC
Start: 2022-09-09 — End: ?
  Filled 2022-09-09: qty 10

## 2022-09-09 MED ORDER — LIDOCAINE HCL 1 % IJ SOLN
INTRAMUSCULAR | Status: AC | PRN
Start: 2022-09-09 — End: 2022-09-09
  Administered 2022-09-09: 1 mL

## 2022-09-09 MED ORDER — FENTANYL CITRATE (PF) 50 MCG/ML IJ SOLN (WRAP)
INTRAMUSCULAR | Status: AC
Start: 2022-09-09 — End: ?
  Filled 2022-09-09: qty 2

## 2022-09-09 MED ORDER — SODIUM CHLORIDE 0.9 % IV SOLN
INTRAVENOUS | Status: AC | PRN
Start: 2022-09-09 — End: 2022-09-09
  Administered 2022-09-09: 20 mL/h via INTRAVENOUS

## 2022-09-09 MED ORDER — DEXTROSE 50 % IV SOLN
12.5000 g | INTRAVENOUS | Status: DC | PRN
Start: 2022-09-09 — End: 2022-09-09

## 2022-09-09 MED ORDER — LIDOCAINE HCL 1 % IJ SOLN
INTRAMUSCULAR | Status: AC
Start: 2022-09-09 — End: ?
  Filled 2022-09-09: qty 10

## 2022-09-09 MED ORDER — BENZOCAINE-MENTHOL MT LOZG (WRAP)
1.0000 | LOZENGE | OROMUCOSAL | Status: DC | PRN
Start: 2022-09-09 — End: 2022-09-14
  Administered 2022-09-11: 1 via BUCCAL
  Filled 2022-09-09: qty 1

## 2022-09-09 MED ORDER — INSULIN LISPRO 100 UNIT/ML SOLN (WRAP)
1.0000 [IU] | Freq: Every evening | Status: DC
Start: 2022-09-09 — End: 2022-09-09

## 2022-09-09 MED ORDER — NON FORMULARY
100.0000 mg | Freq: Two times a day (BID) | Status: DC
Start: 2022-09-09 — End: 2022-09-09

## 2022-09-09 MED ORDER — INSULIN LISPRO 100 UNIT/ML SOLN (WRAP)
1.0000 [IU] | Freq: Three times a day (TID) | Status: DC
Start: 2022-09-09 — End: 2022-09-09

## 2022-09-09 MED ORDER — ACALABRUTINIB MALEATE 100 MG PO TABS
100.0000 mg | ORAL_TABLET | Freq: Two times a day (BID) | ORAL | Status: DC
Start: 2022-09-09 — End: 2022-09-09

## 2022-09-09 MED ORDER — MELATONIN 3 MG PO TABS
3.0000 mg | ORAL_TABLET | Freq: Every evening | ORAL | Status: DC | PRN
Start: 2022-09-09 — End: 2022-09-14

## 2022-09-09 MED ORDER — ALBUTEROL-IPRATROPIUM 2.5-0.5 (3) MG/3ML IN SOLN
3.0000 mL | Freq: Four times a day (QID) | RESPIRATORY_TRACT | Status: DC | PRN
Start: 2022-09-09 — End: 2022-09-14

## 2022-09-09 MED ORDER — ASPIRIN 81 MG PO TBEC
81.0000 mg | DELAYED_RELEASE_TABLET | Freq: Every day | ORAL | Status: DC
Start: 2022-09-10 — End: 2022-09-14
  Administered 2022-09-10 – 2022-09-14 (×5): 81 mg via ORAL
  Filled 2022-09-09 (×5): qty 1

## 2022-09-09 MED ORDER — EMPAGLIFLOZIN 10 MG PO TABS
10.0000 mg | ORAL_TABLET | Freq: Every day | ORAL | Status: DC
Start: 2022-09-10 — End: 2022-09-09

## 2022-09-09 MED ORDER — HEPARIN SODIUM (PORCINE) 1000 UNIT/ML IJ SOLN
INTRAMUSCULAR | Status: AC
Start: 2022-09-09 — End: ?
  Filled 2022-09-09: qty 10

## 2022-09-09 MED ORDER — HEPARIN (PORCINE) IN NACL 2-0.9 UNIT/ML-% IJ SOLN (WRAP)
INTRAVENOUS | Status: AC
Start: 2022-09-09 — End: ?
  Filled 2022-09-09: qty 1500

## 2022-09-09 MED ORDER — POTASSIUM CHLORIDE CRYS ER 20 MEQ PO TBCR
40.0000 meq | EXTENDED_RELEASE_TABLET | Freq: Two times a day (BID) | ORAL | Status: AC
Start: 2022-09-09 — End: 2022-09-11
  Administered 2022-09-09 – 2022-09-11 (×5): 40 meq via ORAL
  Filled 2022-09-09 (×5): qty 2

## 2022-09-09 MED ORDER — ASPIRIN 81 MG PO TBEC
81.0000 mg | DELAYED_RELEASE_TABLET | Freq: Every day | ORAL | Status: DC
Start: 2022-09-10 — End: 2022-09-09

## 2022-09-09 MED ORDER — APIXABAN 5 MG PO TABS
5.0000 mg | ORAL_TABLET | Freq: Two times a day (BID) | ORAL | Status: DC
Start: 2022-09-09 — End: 2022-09-14
  Administered 2022-09-09 – 2022-09-14 (×10): 5 mg via ORAL
  Filled 2022-09-09 (×10): qty 1

## 2022-09-09 MED ORDER — ALUM & MAG HYDROXIDE-SIMETH 200-200-20 MG/5ML PO SUSP
30.0000 mL | ORAL | Status: DC | PRN
Start: 2022-09-09 — End: 2022-09-09

## 2022-09-09 MED ORDER — ACETAMINOPHEN 325 MG PO TABS
650.0000 mg | ORAL_TABLET | ORAL | Status: DC | PRN
Start: 2022-09-09 — End: 2022-09-09
  Administered 2022-09-09: 650 mg via ORAL
  Filled 2022-09-09: qty 2

## 2022-09-09 MED ORDER — POTASSIUM CHLORIDE CRYS ER 20 MEQ PO TBCR
0.0000 meq | EXTENDED_RELEASE_TABLET | ORAL | Status: DC | PRN
Start: 2022-09-09 — End: 2022-09-09

## 2022-09-09 MED ORDER — HEPARIN (PORCINE) IN NACL 1000-0.9 UT/500ML-% IV SOLN - TABLE FLUSH (SEDATION NARRATOR)
INTRAVENOUS | Status: AC | PRN
Start: 2022-09-09 — End: 2022-09-09
  Administered 2022-09-09: 3000 [IU]

## 2022-09-09 MED ORDER — ONDANSETRON HCL 4 MG/2ML IJ SOLN
4.0000 mg | Freq: Four times a day (QID) | INTRAMUSCULAR | Status: DC | PRN
Start: 2022-09-09 — End: 2022-09-09

## 2022-09-09 MED ORDER — DEXTROSE 50 % IV SOLN
12.5000 g | INTRAVENOUS | Status: DC | PRN
Start: 2022-09-09 — End: 2022-09-14

## 2022-09-09 MED ORDER — MIDAZOLAM HCL 1 MG/ML IJ SOLN (WRAP)
INTRAMUSCULAR | Status: AC
Start: 2022-09-09 — End: ?
  Filled 2022-09-09: qty 2

## 2022-09-09 MED ORDER — METOPROLOL SUCCINATE ER 25 MG PO TB24
12.5000 mg | ORAL_TABLET | Freq: Every day | ORAL | Status: DC
Start: 2022-09-09 — End: 2022-09-11
  Administered 2022-09-09 – 2022-09-11 (×3): 12.5 mg via ORAL
  Filled 2022-09-09 (×3): qty 1

## 2022-09-09 MED ORDER — BENZONATATE 100 MG PO CAPS
100.0000 mg | ORAL_CAPSULE | Freq: Three times a day (TID) | ORAL | Status: DC | PRN
Start: 2022-09-09 — End: 2022-09-14
  Administered 2022-09-11: 100 mg via ORAL
  Filled 2022-09-09: qty 1

## 2022-09-09 MED ORDER — ATORVASTATIN CALCIUM 40 MG PO TABS
40.0000 mg | ORAL_TABLET | Freq: Every evening | ORAL | Status: DC
Start: 2022-09-09 — End: 2022-09-14
  Administered 2022-09-09 – 2022-09-13 (×5): 40 mg via ORAL
  Filled 2022-09-09 (×5): qty 1

## 2022-09-09 SURGICAL SUPPLY — 6 items
CATHETER PA PLMR SG CNTRCTH 6FR 110CM (Catheter) ×1
CATHETER PULMONARY ARTERY L110 CM (Catheter) ×1
CATHETER PULMONARY ARTERY L110 CM THERMODILUTION C TIP OD6 FR (Catheter) ×1 IMPLANT
KIT INTRO SS HDRPH .021IN 35MM FLX STRG (Sheaths) ×1
KIT INTRODUCER L10 CM .021 IN L35 MM (Sheaths) ×1
KIT INTRODUCER L10 CM .021 IN L35 MM FLEX STRAIGHT SHEATH DILATOR (Sheaths) ×1 IMPLANT

## 2022-09-09 NOTE — UM Notes (Signed)
Requesting final auth to be faxed to [phone] or call [phone]    Auth Number :  [id number]    Discharge Date -  09/09/2022 10:49 AM    ER ADMIT DATE AND TIME: 09/04/2022 12:06 PM  OBS ADMIT DATE: 09/04/2022 12:06 PM  IP ADMIT DATE: 09/04/2022 12:06 PM    NAME: [name]             MR#: [id number]      Patient Address:  [address]  [place] [address]    Patient phone: [phone] (home)     PATIENT NAME: [name] [name], [name] [name]  DOB: August 05, 1946   PMH:  has a past medical history of Acute CHF, Acute systolic (congestive) heart failure (08/2017), Anxiety, CAD (coronary artery disease), Cardiomyopathy, CHF (congestive heart failure) (08/12/2014, 2013), Chronic obstructive pulmonary disease, Coronary artery disease (2011), Depression, echocardiogram (02/2016, 11/2016, 09/2017, 12/20/2017), GERD (gastroesophageal reflux disease), Heart attack (2011), Hyperlipemia, Hypertension, ICD (implantable 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, Ischemic cardiomyopathy, Nonischemic cardiomyopathy, NSTEMI (non-ST elevated myocardial infarction), 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  PSH:  has a past surgical history that includes duodenal ulcer (1973); orthopedic surgery (AGE 43); EGD (N/A, 08/15/2014); Circumcision (AGE 70); Fracture surgery (AGE 89); Hernia repair (AGE 70); Colonoscopy (2009); Tonsillectomy and adenoidectomy (1954); Cardiac defibrillator placement (12/2012); Cardiac pacemaker placement (2014); EGD (1975); EGD, COLONOSCOPY (N/A, 04/04/2018); ECHOCARDIOGRAM, TRANSTHORACIC (02/2016 11/2016 09/2017 12/2017); MPI nuclear study (06/2016); Correction hammer toe; BIV (11/10/2016); ECHOCARDIOGRAM, TRANSTHORACIC (02/2016,11/2016,12/20/2017); Cardiac catheterization (03/2010); and Cardiac catheterization (12/2012).      DIAGNOSIS:     ICD-10-CM    1. Acute on chronic  right-sided heart failure  I50.813       2. Elevated troponin  R79.89       3. Ischemic cardiomyopathy  I25.5 CANCELED: Cardiac Cath Case Request     CANCELED: Cardiac Cath Case Request     CANCELED: Right Heart Cath     CANCELED: Right Heart Cath

## 2022-09-09 NOTE — Consults (Addendum)
[facility]  INTERVENTIONAL CARDIOLOGY  CONSULTATION REPORT    [facility]  CARDIAC CATHETERIZATION  [address]  [place] [address]  Loc: [phone]      Date Time: 09/09/22 1:40 PM  Patient Name: [name] [name]  Requesting Physician: Burna Forts, *    Reason for Consultation:     NICM  Severe LV Dysfunction  ? ADHF-CS    History of Present Illness:   [name] [name] is a 76 y.o. male admitted on 09/09/2022.  We have been asked by Hospitalist and Heart Failure Teams to provide cardiac consultation regarding CM    76 yo M HLD, DM, recent RLE DVT, CKD/AKI, hx CLL, dilated NICM, severe LV dysfunction, admit recurrent acute-on-chronic systolic HF, with Lactate elevation concerning for possible ADHF-CS    Recommend RHC (and possible leave-in [facility] [depending on data]).     Medical records in Epic chart reviewed  Radiology reviewed  ECG reviewed  Echocardiogram reviewed  Telemetry reviewed    Past Medical/Surgical History:   Patient  has a past medical history of Acute CHF, Acute systolic (congestive) heart failure (08/2017), Anxiety, CAD (coronary artery disease), Cardiomyopathy, CHF (congestive heart failure) (08/12/2014, 2013), Chronic obstructive pulmonary disease, Coronary artery disease (2011), Depression, echocardiogram (02/2016, 11/2016, 09/2017, 12/20/2017), GERD (gastroesophageal reflux disease), Heart attack (2011), Hyperlipemia, Hypertension, ICD (implantable 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, Ischemic cardiomyopathy, Nonischemic cardiomyopathy, NSTEMI (non-ST elevated myocardial infarction), 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    Patient  has a past surgical history that includes duodenal ulcer (1973); orthopedic surgery (AGE 58); EGD (N/A, 08/15/2014); Circumcision (AGE 34); Fracture surgery (AGE 23); Hernia  repair (AGE 34); Colonoscopy (2009); Tonsillectomy and adenoidectomy (1954); Cardiac defibrillator placement (12/2012); Cardiac pacemaker placement (2014); EGD (1975); EGD, COLONOSCOPY (N/A, 04/04/2018); ECHOCARDIOGRAM, TRANSTHORACIC (02/2016 11/2016 09/2017 12/2017); MPI nuclear study (06/2016); Correction hammer toe; BIV (11/10/2016); ECHOCARDIOGRAM, TRANSTHORACIC (02/2016,11/2016,12/20/2017); Cardiac catheterization (03/2010); and Cardiac catheterization (12/2012).    Family History:   Patient's family history includes Breast cancer in his mother; Coronary artery disease in his mother; Diabetes in his brother and mother; Heart attack in his father; Heart attack (age of onset: 10) in his mother; Hypertension in his father and mother.    Social History:   Patient  reports that he has never smoked. He has never used smokeless tobacco. He reports that he does not currently use alcohol. He reports that he does not use drugs.    Allergies:     Allergies   Allergen Reactions    Allopurinol        Medications:     Current Outpatient Medications   Medication Instructions    apixaban (ELIQUIS) 10 mg, Oral, Every 12 hours, For 1 week followed by 1 tablet twice daily    aspirin EC 81 mg, Oral, Daily    atorvastatin (LIPITOR) 40 mg, Oral, At bedtime    Calquence 100 MG Tab No dose, route, or frequency recorded.    Jardiance 10 mg, Oral, Daily    loratadine (CLARITIN) 10 mg, Oral, Daily    metOLazone (ZAROXOLYN) 10 MG tablet Take 1 tablet (10 mg) by mouth daily    metoprolol succinate XL (TOPROL-XL) 12.5 mg, Oral, Daily    niacin (NIASPAN) 500 MG CR tablet TAKE 1 TABLET BY MOUTH TWICE DAILY    Semglee, yfgn, 100 UNIT/ML Solution Pen-injector ADMINISTER 50 UNITS UNDER THE SKIN AT BEDTIME    SITagliptin-metFORMIN (  Janumet) 50-500 MG tablet 1 tablet, Oral, 2 times daily with meals    torsemide (DEMADEX) 60 mg, Oral, 2 times daily    valsartan (DIOVAN) 20 mg, Oral, Daily       Inpatient Scheduled Meds: PRN Meds:    acalabrutinib  Maleate, 100 mg, Oral, Q12H SCH  apixaban, 5 mg, Oral, Q12H SCH  [START ON 09/10/2022] aspirin EC, 81 mg, Oral, Daily  [START ON 09/10/2022] aspirin EC, 81 mg, Oral, Daily  atorvastatin, 40 mg, Oral, QHS  atorvastatin, 40 mg, Oral, QHS  [START ON 09/10/2022] empagliflozin, 10 mg, Oral, Daily  [START ON 09/10/2022] empagliflozin, 10 mg, Oral, Daily  [START ON 09/10/2022] fluticasone furoate-vilanterol, 1 puff, Inhalation, QAM  insulin lispro, 1-3 Units, Subcutaneous, QHS  insulin lispro, 1-5 Units, Subcutaneous, TID AC  insulin lispro, 1-5 Units, Subcutaneous, TID AC  NON-FORMULARY order form, 100 mg, Oral, BID  potassium chloride, 40 mEq, Oral, BID  [START ON 09/10/2022] torsemide, 60 mg, Oral, BID  [START ON 09/10/2022] valsartan, 20 mg, Oral, Daily        Continuous Infusions:   acetaminophen, 650 mg, Q6H PRN  albuterol-ipratropium, 3 mL, Q6H PRN  benzocaine-menthol, 1 lozenge, Q2H PRN  benzonatate, 100 mg, TID PRN  artificial tears (REFRESH PLUS), 1 drop, TID PRN  dextrose, 15 g of glucose, PRN   Or  dextrose, 12.5 g, PRN   Or  dextrose, 12.5 g, PRN   Or  glucagon (rDNA), 1 mg, PRN  dextrose, 15 g of glucose, PRN   Or  dextrose, 12.5 g, PRN   Or  dextrose, 12.5 g, PRN   Or  glucagon (rDNA), 1 mg, PRN  magnesium sulfate, 1 g, PRN  melatonin, 3 mg, QHS PRN  naloxone, 0.2 mg, PRN  potassium & sodium phosphates, 2 packet, PRN  potassium chloride, 0-40 mEq, PRN   And  potassium chloride, 10 mEq, PRN  saline, 2 spray, Q4H PRN          Physical Exam:     Vitals:    09/09/22 1306   Pulse: 90       Constitutional: Cooperative, alert, no acute distress.  Neck: + JVD  Cardiac: Regular rate and rhythm, normal S1 and S2; no S3 or S4, no murmurs, no rubs, no gallops.  Pulmonary: Clear to auscultation bilaterally, no wheezing, no rhonchi, no rales.  Extremities: no edema.  Vascular: +2 pulses in radial artery bilaterally, 2+ pedal pulses bilaterally.    Lab/Test Findings Reviewed:     Medical records in Epic chart  reviewed  Radiology reviewed  ECG reviewed  Echocardiogram reviewed  Telemetry reviewed    Recent Labs     09/09/22  0454 09/08/22  0743   Sodium 135 140   Potassium 3.7 3.4*   Chloride 89* 92*   CO2 31* 36*   BUN 58.0* 55.0*   Creatinine 2.3* 2.1*   Calcium 9.5 9.8   Magnesium  --  2.3   WBC 11.99*  --    Hgb 14.3  --    Hematocrit 41.6  --    Platelets 170  --    AST (SGOT) 18 18   ALT 14 12       ASSESSMENT:   Patient is a 76 y.o. 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  -------------------------------------------------------------------------------------  Signed by:         Burna Forts, MD         [facility]    [facility] Contact Information   [facility]  Secure Chat (Group):   [facility] [place] [facility]    APP Spectralink:  [phone]    MD Spectralink :  [phone]  [phone]    After hours, non urgent consult line:  [phone]    After hours, physician on-call:  [phone] [facility]  Secure Chat (Group):   [facility] [place] [facility]    APP Spectralink:  [phone]    MD Spectralink :  [phone]      After hours, non urgent consult line:  [phone]    After hours, physician on-call:  [phone] [facility]  Secure Chat (Group):   [facility]    APP Spectralink:  [phone]    MD Spectralink :  [phone]      After hours, non urgent consult line:  [phone]    After hours, physician on-call:  [phone] [facility]  Secure Chat (Group):   [facility]    APP  Spectralink:  [phone]    MD Spectralink :  [phone]      After hours, non urgent consult line:  [phone]    After hours, physician on-call:  [phone]       This note was generated by the Dragon speech recognition and may contain errors or omissions not intended by the user. Grammatical errors, random word insertions, deletions, pronoun errors, and incomplete sentences are occasional consequences of this technology due to software limitations. Not all errors are caught or corrected. If there are questions or concerns about the content of this note or information contained within the body of this dictation, they should be addressed directly with the author for clarification.

## 2022-09-09 NOTE — Consults (Signed)
Advanced Heart Failure and Transplant History & Physical    Epic Chat Group: '[place] IP Advanced Heart Failure'    Primary Cardiologist: Marianna Fuss, MD  CV Surgeon: N/A  Referring Physician: Greggory Brandy, MD     Listed for transplant: N/A    Assessment:   76 y.o M with PMH CLL (on Calq) HLD, DM, recent RLE DVT, CKD/AKI, hx CLL, dilated NICM, severe LV dysfunction, admit recurrent acute-on-chronic systolic HF, with Lactate elevation per OSH labs concerning for possible CS, referred for RHC.    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 RV 39/4. PA 41/21/29. PCW 14/18/14. Sats: RA 59%, PA 62%, AO 95%, Room Air. Fick CO/CI 4.0/2.0, TD CO/CI 3.5/1.7. HR 80, BP (cuff) 115/72/89.  No CICU indication or PA Catheter monitoring   Preload: Isosorbide dinitrite 10mg  TID   Afterload: Hydralazine 10mg  TID  BP hold parameter to hold for SBP<88  AKI on CKD3a (baseline Cr 2.0, at baseline)  Avoid nephrotoxic medications  Hold ACEi/ARB/ARNI, MRA and Jardiance  T2DM, insulin-dependent  RLE DVT (02/2022)  Continue Eliquis    Signed by: Lyndal Pulley, MD     I have seen and examined the patient and agree with the assessment and plan as outlined above.     Drowsy after RHC  VSS  Gen - healthy appearing    Imp:  initially presented with increased lactate and decompensated HF  Received appropriate medical therapy in hospital.  He was stated to be not very compliant per wife with heart medications  By this RHC the lactate was better on meds  RHC with good filling pressures and mild drop in cardiac output    Plan:  FinishLVAD evaluation  Medical therapy. Add hydralazine nitrates for now and follow cr to improvement.  Then we can return him to valsartan and eventually Entresto.    Plan:  Royce Macadamia, MD  Heart Failure / MCS / Heart Transplant        Subjective:   CC:  Advanced HF therapies evaluation    HPI:    [name] is a 76 y.o. male with PMH of CLL (on Calq) HLD, DM, recent RLE DVT, CKD/AKI, hx CLL, dilated NICM, severe LV dysfunction, admit recurrent acute-on-chronic systolic HF who was sent to [facility] from Oncology office 09/04/2022.    Patient had a follow up appointment with his Oncologist on 09/04/2022 when he was noted to be dyspneic. He had gained weight 6-8lbs over the past 3 weeks, had increased abdominal girth and lower extremity edema. Per wife, patient has not been taking his home medications consistently for the past 3 weeks.     On arrival to [facility] 09/04/2022, labs revealed WBC 13.15, H/H 12.6/39.1, Platelets 118,  Na 140, K 4.5, BUN/Cr 46/2.0, INR 1.5, BNP 11,362, LA 2.0 (11/21), and hs Troponin 58.9. Vital signs showed temp 97.8, HR 89, BP 120/77. Studies include CXR, TTE and CTA CAP done. He denies any ICD discharges.     ROS:  All other systems were reviewed and are otherwise negative except as noted in the HPI.          Patient has BMI=There is no height or weight on file to calculate BMI.  Diagnosis: Overweight based on BMI criteria    Patient Active Problem List   Diagnosis    Type 2 diabetes mellitus with complication, with long-term current use of insulin    Heart attack    CAD (coronary artery disease)    Non-ischemic cardiomyopathy    Acute exacerbation of CHF (congestive heart  failure)    PUD (peptic ulcer disease)    Acute dyspnea    CHF (congestive heart failure)    Headaches due to old head injury    Essential hypertension    Syncope and collapse    AICD (automatic cardioverter/defibrillator) present    Cardiomyopathy    SOB (shortness of breath)    GERD (gastroesophageal reflux disease)    Hypertensive heart disease without heart failure    Heart failure, unspecified    Nonischemic cardiomyopathy    Pure hypercholesterolemia    Chronic systolic congestive heart failure    Encounter for implantable defibrillator reprogramming or check    Episode of recurrent major depressive disorder, unspecified depression episode severity    Stage 3 chronic kidney disease, 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    Anxiety and depression    COPD (chronic obstructive pulmonary disease)    HLD (hyperlipidemia)    Sleep apnea, obstructive    Scrotal edema    Hydrocele, unspecified hydrocele type    Acute on chronic congestive heart failure, unspecified heart failure type    Deep vein thrombosis (DVT) of other vein of right lower extremity, unspecified chronicity    Acute on chronic right-sided heart failure    Ischemic cardiomyopathy       Past Medical History:  Past Medical History:   Diagnosis Date    Acute CHF     NOV  & DEC 2018, - DIALYSIS CATH PLACED Aug 24 2017 TO REMOVE FLUID     Acute systolic (congestive) heart failure 08/2017    Anxiety     CAD (coronary artery disease)     Cardiomyopathy     nonischemic    CHF (congestive heart failure) 08/12/2014, 2013    Chronic obstructive pulmonary disease     POSSIBLE PER PT HE USES  SYNBICORT BID ABD  BREO INHALER PRN    Coronary artery disease 2011    CORONARY STENT PLACEMENT FOLLOWED BY [facility]    Depression     echocardiogram 02/2016, 11/2016, 09/2017, 12/20/2017    GERD (gastroesophageal reflux disease)     Heart attack 2011    and 2014    Hyperlipemia     Hypertension     ICD (implantable 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     PLACED 2014  [place] [facility]  LAST INTERROGATION April 01 2018 REPORT REQUESTED    Ischemic cardiomyopathy     EF 15% ON ECHO 09-20-2017 DR GARG IN EPIC    Nonischemic cardiomyopathy     NSTEMI (non-ST elevated myocardial infarction)     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    Pacemaker 2014    MEDTRONIC ICD/PACEMAKER COMBO LAST INTERROGATION  12-12 2018    Pneumonia 09/2016    Primary cardiomyopathy     Ischemic cardiomyopathy EF 15% ON ECHO 09-20-2017 DR GARG IN EPIC    Sepsis 08/2017    Syncope and collapse     PRIOR TO ICD/PACEMAKER PLACED 2014    Type 2 diabetes mellitus, controlled DX 1998    BS  AVG 100  A1C 7.4 Dec 27 2017    [place]     PT SEE HIS PMD April 01 2018 RE THIS-TOLD TO SEE PMD BY [facility] JUNE 12 WHEN THEY SAW HIM FOR A CL        Past Surgical History:  Past Surgical History:   Procedure Laterality Date    BIV  11/10/2016    ICD METRONIC  UPGRADE     CARDIAC CATHETERIZATION  03/2010    CORONARY STENT PLACEMENT X 2 PER PT; LM normal, LAD 70-80% distal lesion at apex, 20% lesion mid CFX    CARDIAC CATHETERIZATION  12/2012    CARDIAC DEFIBRILLATOR PLACEMENT  12/2012    [facility]    CARDIAC PACEMAKER PLACEMENT  2014    MEDTRONIC ICD/PACEMAKER PLACED [facility]    CIRCUMCISION  AGE 42    COLONOSCOPY  2009    CORRECTION HAMMER TOE      duodenal ulcer  1973    STRESS RELATED IN SCHOOL    ECHOCARDIOGRAM, TRANSTHORACIC  02/2016 11/2016 09/2017 12/2017    ECHOCARDIOGRAM, TRANSTHORACIC  02/2016,11/2016,12/20/2017    EGD N/A 08/15/2014    Procedure: EGD;  Surgeon: Colon Branch, MD;  Location: [facility];  Service: Gastroenterology;  Laterality: N/A;  egd w/ bx    EGD  1975    EGD, COLONOSCOPY N/A 04/04/2018    Procedure: EGD with bxs, COLONOSCOPY with polypectomy and clipping;  Surgeon: Doyne Keel, MD;  Location: [facility];  Service: Gastroenterology;  Laterality: N/A;    FRACTURE SURGERY  AGE 89     RT  ANKLE- CAST APPLIED    HERNIA REPAIR  AGE 42    LEFT INGUINAL HERNIA    MPI nuclear study  06/2016    orthopedic surgery  AGE  29    RT foot corrective - HAMMERTOE    TONSILLECTOMY AND ADENOIDECTOMY  1954          Current Meds:  acalabrutinib Maleate, 100 mg, Oral, Q12H SCH  apixaban, 5 mg, Oral, Q12H SCH  [START ON 09/10/2022] aspirin EC, 81 mg, Oral, Daily  [START ON 09/10/2022] aspirin EC, 81 mg, Oral, Daily  atorvastatin, 40 mg, Oral, QHS  atorvastatin, 40 mg, Oral, QHS  [START ON 09/10/2022] empagliflozin, 10 mg, Oral, Daily  [START ON 09/10/2022] empagliflozin, 10 mg, Oral, Daily  [START ON 09/10/2022] fluticasone furoate-vilanterol, 1 puff, Inhalation, QAM  insulin glargine, 18 Units, Subcutaneous, QHS  insulin lispro, 1-4 Units, Subcutaneous, QHS  insulin lispro, 1-8 Units, Subcutaneous, TID AC  insulin lispro, 6 Units, Subcutaneous, TID AC  NON-FORMULARY order form, 100 mg, Oral, BID  potassium chloride, 40 mEq, Oral, BID  [START ON 09/10/2022] torsemide, 60 mg, Oral, BID  [START ON 09/10/2022] valsartan, 20 mg, Oral, Daily        Drips:      Home Meds:  Home Medications               apixaban (ELIQUIS) 5 MG     Take 2 tablets (10 mg) by mouth every 12 (twelve) hours For 1 week followed by 1 tablet twice daily  aspirin EC 81 MG EC tablet     Take 1 tablet (81 mg) by mouth daily     atorvastatin (LIPITOR) 40 MG tablet     Take 1 tablet (40 mg total) by mouth nightly     Notes:  PLEASE ADVISE PATIENT TO SCHEDULE OVERDUE FOLLOW-UP WITH GENERAL CARDIOLOGY (DR. Eliseo Gum) FOR CONTINUED MANAGEMENT/REFILLS.     Calquence 100 MG Tab          Notes:  Pt would like to discuss possible medication side effects.     Jardiance 10 MG tablet     TAKE 1 TABLET(10 MG) BY MOUTH DAILY     loratadine (CLARITIN) 10 MG tablet     Take 1 tablet (10 mg) by mouth daily     metOLazone (ZAROXOLYN) 10 MG tablet     Take 1 tablet (10 mg) by mouth daily     metoprolol succinate XL (TOPROL-XL) 25 MG 24 hr tablet     Take 0.5 tablets (12.5 mg) by mouth daily     niacin (NIASPAN) 500 MG CR tablet     TAKE 1 TABLET BY MOUTH TWICE DAILY     Patient taking differently:  250 mg Taking half a tablet     Semglee, yfgn, 100 UNIT/ML Solution Pen-injector     ADMINISTER 50 UNITS UNDER THE SKIN AT BEDTIME     SITagliptin-metFORMIN (Janumet) 50-500 MG tablet     Take 1 tablet by mouth 2 (two) times daily with meals     torsemide (DEMADEX) 20 MG tablet     Take 3 tablets (60 mg) by mouth 2 (two) times daily     valsartan (DIOVAN) 40 MG tablet     Take 0.5 tablets (20 mg) by mouth daily         Notes:       Patient not taking:                 Allergies:  Allergies   Allergen Reactions    Allopurinol        Family History:  Family History   Problem Relation Age of Onset    Breast cancer Mother     Heart attack Mother 53    Hypertension Mother     Diabetes Mother     Coronary artery disease Mother     Diabetes Brother     Heart attack Father     Hypertension Father        Social History:  Social History     Socioeconomic History    Marital status: Married     Spouse name: Teacher, music    Number of children: 0   Occupational History    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, [place]    Tobacco Use    Smoking status: Never    Smokeless tobacco: Never   Vaping Use    Vaping Use: Never used   Substance and Sexual Activity    Alcohol use: Not Currently    Drug use: No    Sexual activity: Yes     Partners: Female     Social Determinants of Health     Food Insecurity: No Food Insecurity (09/04/2022)    Hunger Vital Sign     Worried About Running Out of Food in the Last Year: Never true     Ran Out of Food in the Last Year: Never true   Transportation Needs: No Transportation Needs (09/04/2022)    PRAPARE - Transportation     Lack of  Transportation (Medical): No     Lack of Transportation (Non-Medical): No   Intimate Partner Violence: Not At Risk (09/04/2022)    Humiliation, Afraid, Rape, and Kick questionnaire     Fear of Current or Ex-Partner: No     Emotionally Abused: No     Physically Abused: No     Sexually Abused: No   Housing Stability: Low Risk  (09/04/2022)    Housing Stability Vital Sign     Unable to  Pay for Housing in the Last Year: No     Number of Places Lived in the Last Year: 1     Unstable Housing in the Last Year: No          Objective:   Vital Signs in last 24 hours:  Temp:  [97.3 F (36.3 C)-98.9 F (37.2 C)] 97.7 F (36.5 C)  Heart Rate:  [82-95] 90  Resp Rate:  [16-19] 18  BP: (93-106)/(66-75) 103/67          Telemetry: NSR    Hemodynamics: RA 7. RV 39/4. PA 41/21/29. PCW 14/18/14. Sats: RA 59%, PA 62%, AO 95%, Room Air. Fick CO/CI 4.0/2.0, TD CO/CI 3.5/1.7. HR 80, BP (cuff) 115/72/89.    No data found.      Intake / Output:  No intake or output data in the 24 hours ending 09/09/22 1427       Weight:      09/07/2022     4:10 AM 09/08/2022     3:51 AM 09/09/2022     3:12 AM   Weight Monitoring   Weight 88.179 kg 85.095 kg 84.097 kg   Weight Method Standing Scale Standing Scale Standing Scale        Weight change:        Physical Exam:   General: well-developed, alert, NAD  HEENT: conjunctiva pale, buccal mucosa moist, anicteric sclera, no erythema, JVP ~ 7 cm H2O, no carotid bruits  Lungs: CTA B/L, no rhonchi, wheezes or crackles with normal rate and effort  Cardiovascular: S1, S2, normal rate, regular rhythm, no m/r/g  Abdominal: soft, NT/ND, +B.S., no HSM  Extremities: warm to touch, no cyanosis, no edema, peripheral pulses present bilaterally  Neuromuscular exam: alert and oriented x 3, speech fluent   Skin: warm     LDAs:  Patient Lines/Drains/Airways Status       Active Lines, Drains and Airways       None                      Labs:  CBC        09/09/22  0454 09/05/22  0758 09/04/22  1225   WBC 11.99* 14.86* 13.15*   Hgb 14.3 12.2* 12.6   Hematocrit 41.6 38.5 39.1   Platelets 170 118* 118*       BMP        09/09/22  0454 09/08/22  1718 09/08/22  0743 09/07/22  1433 09/07/22  0657 09/06/22  0644 09/05/22  0758 09/04/22  1225   BUN 58.0*  --  55.0*  --  53.0* 55.0*   < > 46.0*   Creatinine 2.3*  --  2.1*  --  1.9* 2.0*   < > 2.0*   eGFR 28.8*  --  32.1*  --  36.2* 34.0*   < > 34.0*   Sodium 135   --  140  --  139 139   < > 140   Potassium 3.7  --  3.4*  --  4.3 3.7   < > 4.5   Chloride 89*  --  92*  --  98* 104   < > 109   CO2 31*  --  36*  --  29 24   < > 21   Calcium 9.5  --  9.8  --  9.4 9.1   < > 9.5   Magnesium  --   --  2.3  --  2.2 2.1  --  2.3   Phosphorus  --   --   --   --   --   --   --  4.0   Lactic Acid  --  2.0  --   --   --   --   --   --    NT-proBNP  --   --   --   --   --   --   --  11,362*   LDH  --   --   --  190  --   --   --   --     < > = values in this interval not displayed.       LFTs        09/09/22  0454 09/08/22  0743 09/04/22  1225   AST (SGOT) 18 18 16    ALT 14 12 14    Alkaline Phosphatase 98 80 69   Bilirubin, Total 2.2* 1.9* 2.4*   Bilirubin Direct 0.7* 0.7*  --    Bilirubin Indirect 1.5* 1.2*  --        Coags        09/07/22  1433 09/04/22  1257   PT 17.4* 17.1*   PT INR 1.5* 1.5*   PTT 38 34       Tacrolimus Levels         ABGs        Microbiology  Microbiology Results (last 15 days)       Procedure Component Value Units Date/Time    COVID-19 (SARS-CoV-2) and Influenza A/B & RSV (Cepheid)- Age less than 5 years [[id number]] Collected: 09/04/22 1225    Order Status: Completed Specimen: Nasopharyngeal Swab Updated: 09/04/22 1325     Purpose of COVID testing Diagnostic -PUI     SARS-CoV-2 Specimen Source Nasal Swab     SARS CoV 2 Overall Result Negative     Comment: Test performed using the Cepheid Xpert Xpress SARS-CoV-2 assay.  This assay is only for use under the Food and Drug  Administration's Emergency Use Authorization. This is a  real-time RT-PCR assay for the qualitative detection of  SARS-CoV-2 (COVID-19) RNA. Viral nucleic acids may persist in  vivo, independent of viability. Detection of viral nucleic acid  does not imply the presence of infectious virus, or that virus  nucleic acid is the cause of clinical symptoms.  Test performance has not been established for immunocompromised  patients or patients without signs and symptoms of respiratory  infection. Negative  results do not preclude SARS-CoV-2infection  and should not be used as the sole basis for patient  management decisions.  Invalid results may be due to inhibiting  substances in the specimen and recollection should occur.    Please see Fact Sheets for patients and providers located at:  http://www.rice.biz/          Influenza A Negative     Influenza B Negative     Respiratory Syncytial Virus Negative     Comment: Test performed using the Cepheid assay.  Please see  Fact Sheets for patients and providers located at:  http://www.rice.biz/    Test performed using the Cepheid Xpert Xpress SARS-CoV-2/Flu/RSV  assay. This assay is only for use under the Food and Drug  Administration's Emergency Use Authorization.  This is a multiplexed  real-time RT-PCR test intended for the simultaneous qualitative  detection and differentiation of SARS-CoV-2, influenza A, influenza B,  and respiratory syncytial virus (RSV) viral RNA.  Viral nucleic acids  may persist in vivo, independent of viability. Detection of viral  nucleic acid does not imply the presence of infectious virus, or that  virus nucleic acid is the cause of clinical symptoms. If only one viral  target is positive but coinfection with multiple targets is suspected,  the sample should be re-tested with another FDA cleared, approved, or  authorized test, if coinfection would change clinical management.  Negative results do not preclude SARS-CoV-2, influenza A virus,  influenza B virus and/or RSV infection and should not be used as the  sole basis for treatment or other patient management decisions.  Invalid results may be due to inhibiting substances in the specimen  and recollection should occur.         Narrative:      o Collect and clearly label specimen type:  o PREFERRED-Upper respiratory specimen: One Nasal Swab in  Transport Media.  o Hand deliver to laboratory ASAP  Diagnostic -PUI              Cardiac Studies:  Echocardiogram:    09/07/2022  Summary    * Markedly dilated left ventricle with severely reduced systolic function  (LVEDVi 109 mL/m2, ejection fraction 21%).    * Hypertrabeculated left ventricle with severe global hypokinesis.    * Mild aortic valve and moderate mitral valve regurgitation (details below).    * Wire in right-sided chambers with mild to moderate (2+) peri-device  tricuspid valve regurgitation.    * Dilated, poorly responsive IVC.    * When visually compared with the prior study dated 05/29/2021, LV function  is less vigorous (previous EF 31%, now 21%; previous LVOT VTI 17 cm, now 11  cm); valvular regurgitation is less prominent.       Right Heart Catheterization: RA 7. RV 39/4. PA 41/21/29. PCW 14/18/14. Sats: RA 59%, PA 62%, AO 95%, Room Air. Fick CO/CI 4.0/2.0, TD CO/CI 3.5/1.7. HR 80, BP (cuff) 115/72/89.      Imaging:  Radiology Results (24 Hour)       ** No results found for the last 24 hours. **

## 2022-09-09 NOTE — Plan of Care (Signed)
NURSING SHIFT NOTE     Patient: [name] [name]  Day: 0      SHIFT EVENTS     Shift Narrative/Significant Events (PRN med administration, fall, RRT, etc.):     Patient a/ox4  Even and unlabored resp  In no apparent distress  Resting comfortably    Safety and fall precautions remain in place. Purposeful rounding completed.          ASSESSMENT     Changes in assessment from patient's baseline this shift:    Neuro: No  CV: No  Pulm: No  Peripheral Vascular: No  HEENT: No  GI: No  BM during shift: No, Last BM: Last BM Date: 09/09/22  GU: No   Integ: No  MS: No    Pain: None      Mobility: PMP Activity: Step 6 - Walks in Room of             Lines     Patient Lines/Drains/Airways Status       Active Lines, Drains and Airways       None                         VITAL SIGNS     Vitals:    09/09/22 1918   BP: 112/53   Pulse:    Resp:    Temp:    SpO2:        Temp  Min: 96.4 F (35.8 C)  Max: 98.9 F (37.2 C)  Pulse  Min: 82  Max: 95  Resp  Min: 16  Max: 19  BP  Min: 83/53  Max: 112/53  SpO2  Min: 92 %  Max: 98 %      Intake/Output Summary (Last 24 hours) at 09/09/2022 2031  Last data filed at 09/09/2022 1913  Gross per 24 hour   Intake --   Output 650 ml   Net -650 ml            CARE PLAN       Problem: Moderate/High Fall Risk Score >5  Goal: Patient will remain free of falls  Outcome: Progressing  Flowsheets (Taken 09/09/2022 1438 by Orion Modest, RN)  Moderate Risk (6-13):   MOD-Consider activation of bed alarm if appropriate   MOD-Apply bed exit alarm if patient is confused   MOD-Floor mat at bedside (where available) if appropriate   LOW-Anticoagulation education for injury risk   LOW-Fall Interventions Appropriate for Low Fall Risk   MOD-Perform dangle, stand, walk (DSW) prior to mobilization   MOD-Remain with patient during toileting     Problem: Hemodynamic Status: Cardiac  Goal: Stable vital signs and fluid balance  Outcome: Progressing  Flowsheets (Taken 09/09/2022 1540 by Orion Modest,  RN)  Stable vital signs and fluid balance:   Assess signs and symptoms associated with cardiac rhythm changes   Monitor lab values     Problem: Inadequate Tissue Perfusion  Goal: Adequate tissue perfusion will be maintained  Outcome: Progressing  Flowsheets (Taken 09/09/2022 2030)  Adequate tissue perfusion will be maintained:   Monitor/assess lab values and report abnormal values   Monitor/assess neurovascular status (pulses, capillary refill, pain, paresthesia, paralysis, presence of edema)     Problem: Ineffective Gas Exchange  Goal: Effective breathing pattern  Outcome: Progressing  Flowsheets (Taken 09/09/2022 2030)  Effective breathing pattern: Teach/reinforce use of ordered respiratory interventions (ie. CPAP, BiPAP, Incentive Spirometer, Acapella)     Problem: Everyday - Heart Failure  Goal: Stable Vital Signs and Fluid Balance  Outcome: Progressing  Flowsheets (Taken 09/09/2022 1540 by Orion Modest, RN)  Stable Vital Signs and Fluid Balance:   Daily Standing Weights in the morning using the same scale, after using the bathroom and before breadfast.  If unable to stand, zero the bed and use the bed scale   Monitor, assess vital signs and telemetry per policy   Strict Intake/Output   Monitor labs and report abnormalities to physician   Wean oxygen as needed if appropriate   Assess for swelling/edema   Fluid Restriction  Goal: Mobility/Activity is Maintained at Optimal Level for Patient  Outcome: Progressing  Flowsheets (Taken 09/09/2022 2030)  Mobility/Activity is Maintained at Optimal Level for Patient:   Increase mobility as tolerated/progressive mobility protocol   Maintain SCD's as Ordered   Perform active/passive ROM  Goal: Nutritional Intake is Adequate  Outcome: Progressing  Flowsheets (Taken 09/09/2022 2030)  Nutritional Intake is Adequate:   Cardiac diet-2 gm Sodium   Fluid Restricction if needed  Goal: Teaching-Using CHF Warning Zones and Educational Videos  Outcome: Progressing  Flowsheets  (Taken 09/09/2022 2030)  Teaching-Using CHF Warning Zones and Educational Videos:   Signs & Symptoms of CHF   Daily Standing Weights & record   Medications   Sodium Restriction

## 2022-09-09 NOTE — Plan of Care (Addendum)
Shift Comment:  -Pt admitted to unit ~1140 w/ all belongings at bed side. Tele initiated. Fall precautions in place.  -Pt home chemo med in pt bin, label created by pharmacy for inpt use  -RHC completed  -Pt consented for LVAD workup    Plan:  -Chemo precautions  -LVAD workup   -NPO @[place]  for Abd [place]    4 eyes in 4 hours pressure injury assessment note:      Completed with: Murrell Converse RN  Unit & Time admitted: CTU4 ~1200           Bony Prominences: Check appropriate box; if wound is present enter wound assessment in LDA     Occiput:                 [x] WNL  []  Wound present  Face:                     [x] WNL  []  Wound present  Ears:                      [x] WNL  []  Wound present  Spine:                    [x] WNL  []  Wound present  Shoulders:             [x] WNL  []  Wound present  Elbows:                  [x] WNL  []  Wound present  Sacrum/coccyx:     [x] WNL  []  Wound present  Ischial Tuberosity:  [x] WNL  []  Wound present  Trochanter/Hip:      [x] WNL  []  Wound present  Knees:                   [x] WNL  []  Wound present  Ankles:                   [x] WNL  []  Wound present  Heels:                    [x] WNL  []  Wound present  Other pressure areas:  []  Wound location       Device related: []  Device name:         LDA completed if wound present: yes/no  Consult WOCN if necessary    Other skin related issues, ie tears, rash, etc, document in Integumentary flowsheet    LUC scar  BR/ dry heels  Scattered bruising/ moles  Birthmark extending from RUE to R posterior back, birthmark abd RLQ  Scratch R knee    Problem: Moderate/High Fall Risk Score >5  Goal: Patient will remain free of falls  Outcome: Progressing  Flowsheets (Taken 09/09/2022 1438)  Moderate Risk (6-13):   MOD-Consider activation of bed alarm if appropriate   MOD-Apply bed exit alarm if patient is confused   MOD-Floor mat at bedside (where available) if appropriate   LOW-Anticoagulation education for injury risk   LOW-Fall Interventions Appropriate for Low Fall Risk   MOD-Perform  dangle, stand, walk (DSW) prior to mobilization   MOD-Remain with patient during toileting     Problem: Hemodynamic Status: Cardiac  Goal: Stable vital signs and fluid balance  Outcome: Progressing  Flowsheets (Taken 09/09/2022 1540)  Stable vital signs and fluid balance:   Assess signs and symptoms associated with cardiac rhythm changes   Monitor lab values     Problem: Day of Admission -  Heart Failure  Goal: Heart Failure Admission  Outcome: Progressing  Flowsheets (Taken 09/09/2022 1540)  Heart Failure Admission:   Standing Weight on admission, if unable to stand zero the bed and use the bed scale   Vital signs and telemetry per policy   Strict Intake/Output   Fluid restriction   Oxygen as needed   Assess for swelling/edema and document   Initiate education with patient and caregiver using CHF Warning Zones and Educational Videos ([employer] or [employer])     Problem: Everyday - Heart Failure  Goal: Stable Vital Signs and Fluid Balance  Outcome: Progressing  Flowsheets (Taken 09/09/2022 1540)  Stable Vital Signs and Fluid Balance:   Daily Standing Weights in the morning using the same scale, after using the bathroom and before breadfast.  If unable to stand, zero the bed and use the bed scale   Monitor, assess vital signs and telemetry per policy   Strict Intake/Output   Monitor labs and report abnormalities to physician   Wean oxygen as needed if appropriate   Assess for swelling/edema   Fluid Restriction

## 2022-09-09 NOTE — Nursing Progress Note (Signed)
Per MD hold morning insulin dose and d/c IV fluids. Report called to [facility] and given to MMT transport staff. Wife at bedside, home chemo med given back to pt's wife. VS and blood sugar checked and stable. MMT transport left with patient at 1030.

## 2022-09-09 NOTE — Plan of Care (Signed)
To briefly review, [name] [name] has a longstanding history of non-ischemic cardiomyopathy. [name] [name] has several markers of advanced heart failure:     NYHA Class IIIB/IV symptoms  End-organ dysfunction (cardiorenal syndrome)  Low EF  Hospitalizations   Escalating diuretic doses (requiring daily metolazone)  Low baseline blood pressures limiting the use of guideline directed medical therapy  A gradual intolerance to guideline directed medical therapy leading to the reduction in doses or cessation of several medications    Overnight updates:   Repeat CT scan 09/08/2022 showed improvement in lymph nodes per hematology.   Lactic acid 09/08/22 - 2.0   Total bili 2.2 this AM       Plan to transfer to [facility] this morning (tentative ETA 930) with plans for RHC at noon. Spoke with patient and wife at bedside and updated on plan.      Conception Oms, NP   Advanced Heart Failure   [facility]

## 2022-09-09 NOTE — Progress Notes (Signed)
09/09/22 1010   Discharge Disposition   Patient preference/choice provided? N/A   Physical Discharge Disposition Home   Mode of Transportation Car   Patient/Family/POA notified of transfer plan Patient informed only   Patient agreeable to discharge plan/expected d/c date? Yes   Bedside nurse notified of transport plan? Yes   CM Interventions   Follow up appointment scheduled? Yes   Is this a Medicare focused or COVID patient? No   Is this appointment within 48-72 hours? No   Referral made for home health RN visit? Does not meet home bound criteria   Multidisciplinary rounds/family meeting before d/c? Yes   Medicare Checklist   Is this a Medicare patient? No     Karalee Height, [place], CCM  [facility]  Case Management  Social Work Case Manager II  [address]   [place], [place] [address]  Phone: [phone]

## 2022-09-09 NOTE — Plan of Care (Signed)
Problem: Pain interferes with ability to perform ADL  Goal: Pain at adequate level as identified by patient  Outcome: Progressing  Flowsheets (Taken 09/07/2022 1348)  Pain at adequate level as identified by patient:   Identify patient comfort function goal   Assess for risk of opioid induced respiratory depression, including snoring/sleep apnea. Alert healthcare team of risk factors identified.   Assess pain on admission, during daily assessment and/or before any "as needed" intervention(s)   Reassess pain within 30-60 minutes of any procedure/intervention, per Pain Assessment, Intervention, Reassessment (AIR) Cycle   Evaluate if patient comfort function goal is met   Offer non-pharmacological pain management interventions     Problem: Side Effects from Pain Analgesia  Goal: Patient will experience minimal side effects of analgesic therapy  Outcome: Progressing  Flowsheets (Taken 09/05/2022 2214 by Evelena Peat, RN)  Patient will experience minimal side effects of analgesic therapy:   Monitor/assess patient's respiratory status (RR depth, effort, breath sounds)   Prevent/manage side effects per LIP orders (i.e. nausea, vomiting, pruritus, constipation, urinary retention, etc.)   Evaluate for opioid-induced sedation with appropriate assessment tool (i.e. POSS)   Assess for changes in cognitive function     Problem: Moderate/High Fall Risk Score >5  Goal: Patient will remain free of falls  Outcome: Progressing  Flowsheets (Taken 09/09/2022 0800)  Moderate Risk (6-13):   MOD-Consider activation of bed alarm if appropriate   MOD-Apply bed exit alarm if patient is confused   MOD-Floor mat at bedside (where available) if appropriate   MOD-Perform dangle, stand, walk (DSW) prior to mobilization   MOD-include family in multidisciplinary POC discussions     Problem: Everyday - Heart Failure  Goal: Stable Vital Signs and Fluid Balance  Outcome: Progressing  Flowsheets (Taken 09/06/2022 1431 by Joaquin Music,  RN)  Stable Vital Signs and Fluid Balance:   Daily Standing Weights in the morning using the same scale, after using the bathroom and before breadfast.  If unable to stand, zero the bed and use the bed scale   Monitor, assess vital signs and telemetry per policy   Monitor labs and report abnormalities to physician   Strict Intake/Output   Fluid Restriction   Assess for swelling/edema   Wean oxygen as needed if appropriate  Goal: Mobility/Activity is Maintained at Optimal Level for Patient  Outcome: Progressing  Flowsheets (Taken 09/06/2022 1431 by Joaquin Music, RN)  Mobility/Activity is Maintained at Optimal Level for Patient:   Perform active/passive ROM   Maintain SCD's as Ordered   Increase mobility as tolerated/progressive mobility protocol   Reposition patient every 2 hours and as needed unless able to reposition self   Consult with Physical Therapy and/or Occupational Therapy   Assess for changes in respiratory status, level of consciousness and/or development of fatigue  Goal: Nutritional Intake is Adequate  Outcome: Progressing  Flowsheets (Taken 09/06/2022 1431 by Joaquin Music, RN)  Nutritional Intake is Adequate:   Cardiac diet-2 gm Sodium   Consult/Collaborate with Nutritionist   Fluid Restricction if needed   Patient and family teaching on low sodium diet   Encourage/perform oral hygiene as appropriate  Goal: Teaching-Using CHF Warning Zones and Educational Videos  Outcome: Progressing  Flowsheets (Taken 09/06/2022 1431 by Joaquin Music, RN)  Teaching-Using CHF Warning Zones and Educational Videos:   Daily Standing Weights & record   CHF Warning Zones and when to call for help   Medications   Signs & Symptoms of CHF   Fluid Restriction if appropriate   Document in  the Education Tab in EPIC with Teach-back   Sodium Restriction     Problem: Compromised Tissue integrity  Goal: Damaged tissue is healing and protected  Outcome: Progressing  Flowsheets (Taken 09/09/2022 0820)  Damaged tissue is healing  and protected:   Monitor/assess Braden scale every shift   Provide wound care per wound care algorithm   Reposition patient every 2 hours and as needed unless able to reposition self   Increase activity as tolerated/progressive mobility   Relieve pressure to bony prominences for patients at moderate and high risk   Avoid shearing injuries   Keep intact skin clean and dry   Use bath wipes, not soap and water, for daily bathing   Use incontinence wipes for cleaning urine, stool and caustic drainage. Foley care as needed   Monitor external devices/tubes for correct placement to prevent pressure, friction and shearing   Monitor patient's hygiene practices   Encourage use of lotion/moisturizer on skin  Goal: Nutritional status is improving  Outcome: Progressing  Flowsheets (Taken 09/09/2022 0820)  Nutritional status is improving:   Assist patient with eating   Allow adequate time for meals

## 2022-09-09 NOTE — Progress Notes (Signed)
[facility] HEMATOLOGY ONCOLOGY PROGRESS NOTE    Date Time: 09/09/22 8:11 AM  Patient Name: [name] [name]      ASSESSMENT/PLAN:   [name] [name] is a 76 y.o. male who presents to the hospital on 09/04/2022 with     Principal Problem:    Acute on chronic right-sided heart failure  Active Problems:    Cardiomyopathy      -- Rai stage IV chronic lymphocytic leukemia.  Was started on acalabrutinib in June 2023.  Restaging scans show improvement in lymph nodes.  White count is also decreased while maintaining normal platelets and hemoglobin.  Continue acalabrutinib unless there is a cardiac reason to stop it.  It would be okay from the hematology perspective to hold it if needed.  The preference would be to continue as this is a lifelong medication.  Discussed with patient and his wife.    Congestive heart failure.  As per cardiology.  Plans for transfer to [facility] noted.    Risk of bleeding with BTK inhibitors has been discussed with her cardiology team.  The risk is higher with older agents like Imbruvica.      SUBJECTIVE:     No new complaints.    PHYSICAL EXAM:     Vitals:    09/09/22 0734   BP: 93/74   Pulse: 88   Resp: 19   Temp: 98.7 F (37.1 C)   SpO2: 95%         Appearance: in no acute distress  HEENT: no icterus, no mucositis  Abdomen: no tenderness, no organomegaly or mass  Extremities: no edema  Neuro: alert, strength grossly intact  Skin: no rash, bleed or bruise        LABS AND RADIOLOGY:     CBC  Recent Labs   Lab 09/09/22  0454 09/05/22  0758 09/04/22  1225   WBC 11.99* 14.86* 13.15*   Hgb 14.3 12.2* 12.6   Hematocrit 41.6 38.5 39.1   Platelets 170 118* 118*   MCV 96.1* 106.1* 102.6*   MCH 33.0 33.6* 33.1   MCHC 34.4 31.7 32.2   RDW 15 17* 17*   Monocytes Absolute  --   --  0.13*       Chemistries   Recent Labs   Lab 09/09/22  0454 09/08/22  0743 09/07/22  0657 09/06/22  0644 09/05/22  0758 09/04/22  1225   Sodium 135 140 139 139 140 140   Potassium 3.7 3.4* 4.3 3.7 4.5  4.5   Chloride 89* 92* 98* 104 106 109   CO2 31* 36* 29 24 22 21    BUN 58.0* 55.0* 53.0* 55.0* 51.0* 46.0*   Magnesium  --  2.3 2.2 2.1  --  2.3   AST (SGOT) 18 18  --   --   --  16   ALT 14 12  --   --   --  14   Alkaline Phosphatase 98 80  --   --   --  69     Coagulation Profile  Recent Labs   Lab 09/07/22  1433 09/04/22  1257   PT INR 1.5* 1.5*       Radiology Reports  Radiology Results (24 Hour)       Procedure Component Value Units Date/Time    CT Chest Abdomen Pelvis WO Contrast [[id number]] Collected: 09/08/22 1325    Order Status: Completed Updated: 09/08/22 1342    Narrative:      HISTORY:  History of  CLL, assess treatment response    COMPARISON: 03/18/2022, 03/11/2022    TECHNIQUE: CT of the chest, abdomen, and pelvis performed without  intravenous contrast. The following dose reduction techniques were  utilized: automated exposure control and/or adjustment of the mA and/or KV  according to patient size, and the use of an iterative reconstruction  technique.    CONTRAST: None.    FINDINGS:  Please note that evaluation of the viscera and vasculature is limited in  the absence of IV contrast.    Chest:  The previously seen axillary nodes are significantly decreased. Evaluation  of the right axilla is slightly limited by streak artifact from the right  upper extremity. The left axillary nodes are upper normal measuring up to  1.2 x 0.8 cm. The largest right axillary node on the current examination  measures approximately 2.4 x 1.2 cm. Small nonenlarged mediastinal nodes  are noted. Coronary artery calcification is noted. A defibrillator is  present. Mild bilateral dependent opacities are noted likely atelectasis.  There is mild bronchiectasis in the lower lobes. Minimal patchy groundglass  opacities noted within the right lower lobe for example image. There is a 4  mm nodule in the right lower lobe,, image 97 series 5 A calcified granuloma  is noted within the right lung base.    Abdomen/Pelvis:  Bilateral  renal cysts are noted. There is significant interval decrease in  size of the patient's abdominal and pelvic lymphadenopathy. A right  inguinal node measures 1.9 x 2.8 cm previously 2.9 x 3.8 cm. Other smaller  inguinal and iliac chain nodes are noted. Small nonenlarged retroperitoneal  nodes are noted. The spleen is not enlarged. The liver, adrenals, and  pancreas are unremarkable. The gallbladder is present and nondistended.  Degenerative changes of the spine are noted.      Impression:        1. Significant interval decrease in size of previously seen abdominal and  pelvic lymph nodes with small persistent mildly enlarged inguinal nodes and  small iliac chain nodes.  2. Bibasilar opacities in the dependent portions of the lungs, nonspecific.    Rocky Crafts, MD  09/08/2022 1:40 PM                  Marvell Fuller, MD, MD  [facility]  [phone]

## 2022-09-09 NOTE — Transfer Summary (Signed)
[facility] HOSPITALIST Discharge Summary  Patient Info:   Date/Time: 09/09/2022 / 9:36 AM   Admit Date:09/04/2022  Patient Name:[name] [name]   ZOX:[id number]   PCP: Zorita Pang, MD  Attending Physician:Evagelia Knack, Olen Pel, MD    Hospital Course:   Please see H&P for complete details of HPI and ROS. The patient was admitted   to [facility] and has been taken care as mentioned below.    76 y/o M with NICM admitted now for acute decompensated heart failure. The patient has had a progressive intolerance to guideline directed medical therapy. Patient presented to the hospital on November 17 with weight gain, dyspnea, orthopnea, lower extremity edema.      Acute on chronic systolic heart failure  Non-ischemic cardiomyopathy,    -[employer] BiV-IC present  - Continue GDMT with Torsemide 60 mg BID , Valsartan 20 mg daily, Toprol XL 12.5 mg, spironolactone and Empagliflozin   - Appreciate eval by Advanced HF team - [name] [name] has multiple markers of advanced heart failure.  Dr Ninfa Linden suggested right heart catheterization for risk stratification - transferring him over to [facility] so that he can get the right heart cath at [facility] and further discussions there as well.  Dr. Ninfa Linden already discussed with Dr. Marlowe Sax at [place].   Echo reviewed   -   * Markedly dilated left ventricle with severely reduced systolic function (LVEDVi 109 mL/m2, ejection fraction 21%).    * Hypertrabeculated left ventricle with severe global hypokinesis.    * Mild aortic valve and moderate mitral valve regurgitation    * Wire in right-sided chambers with mild to moderate (2+) peri-device tricuspid valve regurgitation.    * Dilated, poorly responsive IVC.    * When visually compared with the prior study dated 05/29/2021, LV function is less vigorous (previous EF 31%, now 21%; previous LVOT VTI 17 cm, now 11 cm); valvular regurgitation is less prominent.  - Dr Ninfa Linden educated patient and wife about the severity of his condition and  conveyed to him that his short-term prognosis from heart failure is poor. informed [name] [name] that at this juncture in his care, it may be best to consider advanced heart failure therapies. Unfortunately, [name] [name] is not a candidate for heart transplantation due to advanced age. However, [name] [name] may be a candidate for a left ventricular assist device. Dr Ninfa Linden briefly reviewed this device with [name] [name] and his wife (e.g. explained how it works, showed him pictures). [name] [name] expressed that his goal is to live longer and have a meaningful quality of life. Therefore, he is open to undergoing a LVAD evaluation if it is clinically indicated and could help him achieve these goals. [name] [name] understands that this evaluation require him to be transferred to [facility] and he is okay with this.     Rai stage IV chronic lymphocytic leukemia.  - Follows Dr Laurine Blazer - in June 2023 he was started on acalabrutinib- increased risk of bleeding with BTK inhibitors and anticoagulants/ antiplatelet agents. However patient has been tolerating acalabrutinib with Eliquis and low-dose aspirin well.  - He has no B symptoms  -Restaging scans show improvement in lymph nodes.  White count is also decreased while maintaining normal platelets and hemoglobin.   - Dr Laurine Blazer suggested to Continue acalabrutinib unless there is a cardiac reason to stop it.  It would be okay from the hematology perspective to hold it if needed.  The preference would be to continue as this is a lifelong  medication.      Coronary artery disease nonocclusive , last cardiac catheterization in 2014 negative . 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  Diagnosis: Overweight based on BMI criteria        Recent Labs     09/09/22  0454 09/08/22  0743 09/07/22  0657   Potassium 3.7 3.4* 4.3     Diagnosis: Hypokalemia       Disposition:Transfer to [facility]   Condition at Discharge and Prognosis: stable   Admission Date:09/04/2022  Discharge Date: 09/09/22  Type of Admission:Inpatient   Code Status: Full Code  Subjective:     Denies any chest pain, shortness of breath or palpitations     Chief Complaint:  Shortness of Breath      Objective:     Vitals:    09/09/22 0312 09/09/22 0429 09/09/22 0734 09/09/22 0844   BP:  99/66 93/74    Pulse:  87 88 88   Resp:  16 19 16    Temp:  98.9 F (37.2 C) 98.7 F (37.1 C)    TempSrc:  Temporal Temporal    SpO2:  96% 95% 98%   Weight: 84.1 kg (185 lb 6.4 oz)      Height:         Physical Exam:   Heart - S1, S2 heard.  No murmur  Lungs - bilateral air entry present, no wheeze or rales  Abdomen - soft, bowel sounds normal, nondistended, no tenderness  Central nervous system - no focal deficits  Musculoskeletal - no edema      Clinical Presentation:     History of Presenting Illness: Please refer to HPI in the Detailed H&P    Discharge Medications:   Discharge Medications:   Signed and Held Orders          Vital signs with Pulse Ox (per unit protocol)-Every 4 hours    ID [id number] When 09/08/22 2007    Placed By Deloris Ping, MD    Reason Physician Will Release       Progressive Mobility Protocol-Daily    ID [id number] When 09/08/22 2007    Placed By Deloris Ping, MD    Reason Physician Will Release       Notify physician-Until discontinued    ID [id number] When 09/08/22 2007    Placed By Deloris Ping, MD    Reason Physician Will Release       Activate Heart Failure Clinical Pathway-Until discontinued    ID [id number] When  09/08/22 2007    Placed By Deloris Ping, MD    Reason Physician Will Release       Intake and Output-Every shift    ID [id number] When 09/08/22 2007    Placed By Deloris Ping, MD    Reason  Physician Will Release       Height-Once    ID [id number] When 09/08/22 2007    Placed By Deloris Ping, MD    Reason Physician Will Release       [place]    ID [id number] When 09/08/22 2007    Placed By Deloris Ping, MD    Reason Physician Will Release       Weigh patient-Daily    ID [id number] When 09/08/22 2007    Placed By Deloris Ping, MD    Reason Physician Will Release       Skin assessment-Daily    ID [id number] When 09/08/22 2007    Placed By Deloris Ping, MD    Reason Physician Will Release       Place sequential compression device-Once    ID [id number] When 09/08/22 2007    Placed By Deloris Ping, MD    Reason Physician Will Release       Maintain sequential compression device-Every shift    ID [id number] When 09/08/22 2007    Placed By Deloris Ping, MD    Reason Physician Will Release       Full Code-Continuous    ID [id number] When 09/08/22 2007    Placed By Deloris Ping, MD    Reason Physician Will Release       Nasal Cannula Low-Flow (5 lpm or Less)-Continuous    ID [id number] When 09/08/22 2007    Placed By Deloris Ping, MD    Reason Physician Will Release       Fall precautions-Continuous    ID [id number] When 09/08/22 2007    Placed By Deloris Ping, MD    Reason Physician Will Release       Aspiration precautions-Continuous    ID [id number] When 09/08/22 2007    Placed By Deloris Ping, MD    Reason Physician Will Release       Reason for no VTE prophylaxis meds - Patient on therapeutic anticoagulation-Until discontinued    ID [id number] When 09/08/22 2007    Placed By Deloris Ping, MD    Reason Physician Will Release       Contraindication to Cardiac Rehab Referral-Once    ID [id number] When 09/08/22 2007    Placed By Deloris Ping, MD     Reason Physician Will Release       Notify physician-Until discontinued    ID [id number] When 09/08/22 2007    Placed By Deloris Ping, MD    Reason Physician Will Release       Notify physician for Signs/Symptoms of Bleeding-Until discontinued    ID [id number] When 09/08/22 2007    Placed By Deloris Ping, MD    Reason Physician Will Release       Apply Anticoagulation Armband-Until discontinued    ID [id number] When 09/08/22 2007    Placed By Deloris Ping, MD    Reason Physician Will Release       Weigh patient-Once    ID [id number] When 09/08/22 2007    Placed By Deloris Ping, MD    Reason Physician Will Release       Assess if patient received anticoagulants in the last 12 hours-Once    ID [id number] When 09/08/22 2007    Placed By Deloris Ping, MD    Reason Physician Will Release       BLEEDING PRECAUTIONS-Continuous    ID [id number] When 09/08/22  2007    Placed By Deloris Ping, MD    Reason Physician Will Release       Education: Anticoagulation with apixaban (Eliquis)-Once    ID [id number] When 09/08/22 2007    Placed By Deloris Ping, MD    Reason Physician Will Release       apixaban Everlene Balls) tablet 5 mg-Every 12 hours scheduled    ID [id number] When 09/08/22 2007    Placed By Deloris Ping, MD    Reason Physician Will Release       aspirin EC tablet 81 mg-Daily    ID [id number] When 09/08/22 2007    Placed By Deloris Ping, MD    Reason Physician Will Release       atorvastatin (LIPITOR) tablet 40 mg-At bedtime    ID [id number] When 09/08/22 2007    Placed By Deloris Ping, MD    Reason Physician Will Release       fluticasone furoate-vilanterol (BREO ELLIPTA) 100-25 MCG/ACT 1 puff-RT - Every morning    ID [id number] When 09/08/22 2007    Placed By Deloris Ping, MD    Reason Physician Will Release       empagliflozin (JARDIANCE) tablet 10 mg-Daily    ID [id number] When 09/08/22 2007    Placed By Deloris Ping, MD    Reason Physician Will Release        valsartan (DIOVAN) tablet 20 mg-Daily    ID [id number] When 09/08/22 2007    Placed By Deloris Ping, MD    Reason Physician Will Release       albuterol-ipratropium (DUO-NEB) 2.5-0.5(3) mg/3 mL nebulizer 3 mL-RT - Every 6 hours as needed    ID [id number] When 09/08/22 2007    Placed By Deloris Ping, MD    Reason Physician Will Release       Target Glucose Goals-Until discontinued    ID [id number] When 09/08/22 2007    Placed By Deloris Ping, MD    Reason Physician Will Release       Supervise For Meals Frequency: All meals-Until discontinued    ID [id number] When 09/08/22 2007    Placed By Deloris Ping, MD    Reason Physician Will Release       Nursing communication-Until discontinued    ID [id number] When 09/08/22 2007    Placed By Deloris Ping, MD    Reason Physician Will Release       NSG Communication: Glucose POCT order (PRN hypoglycemia)-As needed    ID [id number] When 09/08/22 2007    Placed By Deloris Ping, MD    Reason Physician Will Release       Notify physician (Critical Blood Glucose Value)-Until discontinued    ID [id number] When 09/08/22 2007    Placed By Deloris Ping, MD    Reason Physician Will Release       Notify physician (Communication: Document Abnormal Blood Glucose)-Until discontinued    ID [id number] When 09/08/22 2007    Placed By Deloris Ping, MD    Reason Physician Will Release       POCT order (PRN hypoglycemia)-As needed    ID [id number] When 09/08/22 2007    Placed By Deloris Ping, MD    Reason Physician Will Release       Adult Hypoglycemia Treatment Algorithm-Once    ID [id number] When 09/08/22 2007    Placed By Deloris Ping, MD    Reason  Physician Will Release       dextrose (GLUCOSE) 40 % oral gel 15 g of glucose-As needed    ID [id number] When 09/08/22 2007    Placed By Deloris Ping, MD    Reason Physician Will Release       dextrose (D10W) 10% bolus 125 mL-As needed    ID [id number] When 09/08/22 2007    Placed By Deloris Ping, MD    Reason Physician Will Release       dextrose 50 % bolus 12.5 g-As needed    ID [id number] When 09/08/22 2007    Placed By Deloris Ping, MD    Reason Physician Will Release       glucagon (rDNA) [facility]) injection 1 mg-As needed    ID [id number] When 09/08/22 2007    Placed By Deloris Ping, MD    Reason Physician Will Release       insulin lispro injection 1-5 Units-3 times daily before meals    ID [id number] When 09/08/22 2007    Placed By Deloris Ping, MD    Reason Physician Will Release       NSG Communication: Glucose POCT order-[facility] Before, AC - Glucose    ID [id number] When 09/08/22 2007    Placed By Deloris Ping, MD    Reason Physician Will Release       insulin lispro injection 1-3 Units-At bedtime    ID [id number] When 09/08/22 2007    Placed By Deloris Ping, MD    Reason Physician Will Release       NSG Communication: Glucose POCT order-At bedtime    ID [id number] When 09/08/22 2007    Placed By Deloris Ping, MD    Reason Physician Will Release       MDI/DPI-Every morning (RT)    ID [id number] When 09/08/22 2007    Placed By Deloris Ping, MD    Reason Physician Will Release       Resp Re-Assess Adult (RT Use Only)-2 times daily (RT)    ID [id number] When 09/08/22 2007    Placed By Deloris Ping, MD    Reason Physician Will Release       acalabrutinib Maleate TABS 100 mg-Every 12 hours scheduled    ID [id number] When 09/08/22 2007    Placed By Deloris Ping, MD    Reason Physician Will Release       Diet guest tray - breakfast-Until discontinued    ID [id number] When 09/08/22 2007    Placed By Deloris Ping, MD    Reason Physician Will Release       torsemide [facility]) tablet 60 mg-2 times daily at 0800 and 1400    ID [id number] When 09/08/22 2007    Placed By Deloris Ping, MD    Reason Physician Will Release       Basic Metabolic Panel-Morning Draw x1    ID [id number] When 09/08/22 2007    Placed By Deloris Ping, MD    Reason  Physician Will Release       Diet NPO time specified-Diet effective midnight    ID [id number] When 09/08/22 2007    Placed By Deloris Ping, MD    Reason Physician Will Release       Diet heart healthy Na restriction, if any: 2 GM NA; Additional restrictions: CONSISTENT CARBOHYDRATE-Diet effective now    ID [id number] When 09/08/22 2007    Placed By  Deloris Ping, MD    Reason Physician Will Release       Diet guest tray - lunch-Until discontinued    ID [id number] When 09/08/22 2007    Placed By Deloris Ping, MD    Reason Physician Will Release       Diet guest tray - dinner-Until discontinued    ID [id number] When 09/08/22 2007    Placed By Deloris Ping, MD    Reason Physician Will Release       CBC without differential-Morning Draw x1    ID [id number] When 09/08/22 2007    Placed By Deloris Ping, MD    Reason Physician Will Release       Hepatic function panel (LFT)-Morning Draw x1    ID [id number] When 09/08/22 2007    Placed By Deloris Ping, MD    Reason Physician Will Release       potassium chloride (KLOR-CON M20) CR tablet 40 mEq-2 times daily    ID [id number] When 09/08/22 2007    Placed By Deloris Ping, MD    Reason Physician Will Release               Follow up recommendations:   Follow up:    Follow-up Information       Zorita Pang, MD .    Specialty: Family Medicine  Contact information:  [phone]  [place] [address]  [phone]                              Results of Labs/imaging:   Labs have been reviewed:   Coagulation Profile:   Recent Labs   Lab 09/07/22  1433   PT 17.4*   PT INR 1.5*   PTT 38       CBC review:   Recent Labs   Lab 09/09/22  0454 09/05/22  0758 09/04/22  1225   WBC 11.99* 14.86* 13.15*   Hgb 14.3 12.2* 12.6   Hematocrit 41.6 38.5 39.1   Platelets 170 118* 118*   MCV 96.1* 106.1* 102.6*   RDW 15 17* 17*   Neutrophils  --  14.7  --    Segmented Neutrophils  --   --  14   Neutrophils Absolute  --  2.18  --    Lymphocytes Automated   --  83.4  --    Eosinophils Automated  --  0.8  --    Immature Granulocytes  --  0.1  --    Immature Granulocytes Absolute  --  0.02  --      Chem Review:  Recent Labs   Lab 09/09/22  0454 09/08/22  0743 09/07/22  0657 09/06/22  0644 09/05/22  0758 09/04/22  1225   Sodium 135 140 139 139 140 140   Potassium 3.7 3.4* 4.3 3.7 4.5 4.5   Chloride 89* 92* 98* 104 106 109   CO2 31* 36* 29 24 22 21    BUN 58.0* 55.0* 53.0* 55.0* 51.0* 46.0*   Creatinine 2.3* 2.1* 1.9* 2.0* 2.3* 2.0*   Glucose 196* 55* 106* 114* 165* 141*   Calcium 9.5 9.8 9.4 9.1 9.4 9.5   Magnesium  --  2.3 2.2 2.1  --  2.3   Phosphorus  --   --   --   --   --  4.0   Bilirubin, Total 2.2* 1.9*  --   --   --  2.4*   AST (SGOT) 18 18  --   --   --  16   ALT 14 12  --   --   --  14   Alkaline Phosphatase 98 80  --   --   --  69     Results       Procedure Component Value Units Date/Time    Glucose Whole Blood - POCT [[id number]]  (Abnormal) Collected: 09/09/22 0751     Updated: 09/09/22 0754     Whole Blood Glucose POCT 211 mg/dL     Basic Metabolic Panel [[id number]]  (Abnormal) Collected: 09/09/22 0454    Specimen: Blood Updated: 09/09/22 0522     Glucose 196 mg/dL      BUN 78.2 mg/dL      Creatinine 2.3 mg/dL      Calcium 9.5 mg/dL      Sodium 956 mEq/L      Potassium 3.7 mEq/L      Chloride 89 mEq/L      CO2 31 mEq/L      Anion Gap 15.0     eGFR 28.8 mL/min/1.73 m2     Hepatic function panel (LFT) [[id number]]  (Abnormal) Collected: 09/09/22 0454    Specimen: Blood Updated: 09/09/22 0522     Bilirubin, Total 2.2 mg/dL      Bilirubin Direct 0.7 mg/dL      Bilirubin Indirect 1.5 mg/dL      AST (SGOT) 18 U/L      ALT 14 U/L      Alkaline Phosphatase 98 U/L      Protein, Total 7.2 g/dL      Albumin 3.9 g/dL      Globulin 3.3 g/dL      Albumin/Globulin Ratio 1.2    CBC without differential [[id number]]  (Abnormal) Collected: 09/09/22 0454    Specimen: Blood Updated: 09/09/22 0503     WBC 11.99 x10 3/uL      Hgb 14.3 g/dL      Hematocrit 41.3 %      Platelets 170  x10 3/uL      RBC 4.33 x10 6/uL      MCV 96.1 fL      MCH 33.0 pg      MCHC 34.4 g/dL      RDW 15 %      MPV 11.8 fL      Nucleated RBC 0.0 /100 WBC      Absolute NRBC 0.00 x10 3/uL     Glucose Whole Blood - POCT [[id number]]  (Abnormal) Collected: 09/08/22 2041     Updated: 09/08/22 2049     Whole Blood Glucose POCT 243 mg/dL     Lactic Acid [[id number]] Collected: 09/08/22 1718    Specimen: Blood Updated: 09/08/22 1803     Lactic Acid 2.0 mmol/L     Glucose Whole Blood - POCT [[id number]]  (Abnormal) Collected: 09/08/22 1611     Updated: 09/08/22 1617     Whole Blood Glucose POCT 208 mg/dL     Hepatic function panel (LFT) [[id number]]  (Abnormal) Collected: 09/08/22 0743    Specimen: Blood Updated: 09/08/22 1427     Bilirubin, Total 1.9 mg/dL      Bilirubin Direct 0.7 mg/dL      Bilirubin Indirect 1.2 mg/dL      AST (SGOT) 18 U/L      ALT 12 U/L      Alkaline Phosphatase 80 U/L      Protein, Total 7.1 g/dL  Albumin 3.9 g/dL      Globulin 3.2 g/dL      Albumin/Globulin Ratio 1.2    Glucose Whole Blood - POCT [[id number]]  (Abnormal) Collected: 09/08/22 1232     Updated: 09/08/22 1234     Whole Blood Glucose POCT 106 mg/dL     Glucose Whole Blood - POCT [[id number]]  (Abnormal) Collected: 09/08/22 1214     Updated: 09/08/22 1216     Whole Blood Glucose POCT 167 mg/dL     Glucose Whole Blood - POCT [[id number]]  (Abnormal) Collected: 09/08/22 1149     Updated: 09/08/22 1154     Whole Blood Glucose POCT 54 mg/dL     Glucose Whole Blood - POCT [[id number]]  (Abnormal) Collected: 09/08/22 0951     Updated: 09/08/22 0954     Whole Blood Glucose POCT 114 mg/dL           Radiology reports have been reviewed:  Radiology Results (24 Hour)       Procedure Component Value Units Date/Time    CT Chest Abdomen Pelvis WO Contrast [[id number]] Collected: 09/08/22 1325    Order Status: Completed Updated: 09/08/22 1342    Narrative:      HISTORY:  History of CLL, assess treatment response    COMPARISON: 03/18/2022, 03/11/2022    TECHNIQUE:  CT of the chest, abdomen, and pelvis performed without  intravenous contrast. The following dose reduction techniques were  utilized: automated exposure control and/or adjustment of the mA and/or KV  according to patient size, and the use of an iterative reconstruction  technique.    CONTRAST: None.    FINDINGS:  Please note that evaluation of the viscera and vasculature is limited in  the absence of IV contrast.    Chest:  The previously seen axillary nodes are significantly decreased. Evaluation  of the right axilla is slightly limited by streak artifact from the right  upper extremity. The left axillary nodes are upper normal measuring up to  1.2 x 0.8 cm. The largest right axillary node on the current examination  measures approximately 2.4 x 1.2 cm. Small nonenlarged mediastinal nodes  are noted. Coronary artery calcification is noted. A defibrillator is  present. Mild bilateral dependent opacities are noted likely atelectasis.  There is mild bronchiectasis in the lower lobes. Minimal patchy groundglass  opacities noted within the right lower lobe for example image. There is a 4  mm nodule in the right lower lobe,, image 97 series 5 A calcified granuloma  is noted within the right lung base.    Abdomen/Pelvis:  Bilateral renal cysts are noted. There is significant interval decrease in  size of the patient's abdominal and pelvic lymphadenopathy. A right  inguinal node measures 1.9 x 2.8 cm previously 2.9 x 3.8 cm. Other smaller  inguinal and iliac chain nodes are noted. Small nonenlarged retroperitoneal  nodes are noted. The spleen is not enlarged. The liver, adrenals, and  pancreas are unremarkable. The gallbladder is present and nondistended.  Degenerative changes of the spine are noted.      Impression:        1. Significant interval decrease in size of previously seen abdominal and  pelvic lymph nodes with small persistent mildly enlarged inguinal nodes and  small iliac chain nodes.  2. Bibasilar opacities  in the dependent portions of the lungs, nonspecific.    Rocky Crafts, MD  09/08/2022 1:40 PM          Echo Adult TTE Complete  Result Date: 09/08/2022  Name:     [name] [name] Age:     76 years DOB:     02-17-1946 Gender:     Male MRN:     [id number] Wt:     188 lb BSA:     2.08 m2 HR:     79 bpm Systolic BP:     94 mmHg Diastolic BP:     68 mmHg Technical Quality:     Good Exam Date/Time:     09/08/2022 11:39 AM Study Site:     [facility] Current Location:     [place] Exam Room:     Bedside Exam Type:     ECHO ADULT TTE COMPLETE W CONTRAST Study Info Indications     Acute heart failure - Procedure   Complete two-dimensional, color flow and spectral Doppler transthoracic echocardiogram is performed. Lumason was administered to enhance endocardial definition. Staff Sonographer:     Rhae Lerner Ordering Physician:     Verner Mould MD, [facility] in Summary   * Markedly dilated left ventricle with severely reduced systolic function (LVEDVi 109 mL/m2, ejection fraction 21%).   * Hypertrabeculated left ventricle with severe global hypokinesis.   * Mild aortic valve and moderate mitral valve regurgitation (details below).   * Wire in right-sided chambers with mild to moderate (2+) peri-device tricuspid valve regurgitation.   * Dilated, poorly responsive IVC.   * When visually compared with the prior study dated 05/29/2021, LV function is less vigorous (previous EF 31%, now 21%; previous LVOT VTI 17 cm, now 11 cm); valvular regurgitation is less prominent. Findings Left Ventricle   The left ventricle is severely dilated. There is normal left ventricular geometry. Left ventricular systolic function is severely decreased with an ejection fraction by Biplane Method of Discs of  21 %. Left ventricular segmental wall motion is abnormal. Left ventricular diastolic filling parameters are consistent with Grade II diastolic dysfunction (pseudonormal pattern) with elevated left atrial pressure. Global hypokinesis of the left ventricle.  LVEDVi 109 mL/m2. Right Ventricle   The right ventricular cavity size is normal in size. Normal right ventricular systolic function. Left Atrium   The left atrium is moderately dilated. Right Atrium   The right atrium is normal in size. Atrial Septum   No evidence of interatrial shunt by color Doppler. Aortic Valve   The aortic valve is tricuspid. There is no aortic stenosis. There is mild aortic regurgitation. Pulmonary Valve   The pulmonic valve is structurally normal. There is trace to mild pulmonic regurgitation. Mitral Valve   The mitral valve is structurally normal. There is moderate mitral regurgitation. PISAr 0.6 cm at 31.9 cm/s; VTI 141 cm with Vmax 4.5 m/s := ERO 0.16 cm2; RVol 23 mL/beat. E velocity 0.9 cm/s. Tricuspid Valve   The tricuspid valve is structurally normal. Wire in right-sided chambers with mild to moderate (2+) peri-device tricuspid valve regurgitation. No significant pulmonary hypertension assessed today, with estimated right ventricular systolic pressure of  32 mmHg. Aorta   The aortic root is normal in size at 3.2 cm in diameter. The proximal ascending aorta is not well visualized. Inferior Vena Cava   The IVC is dilated with < 50% respiratory variance consistent with significantly elevated RA pressure of 15 mmHg. Pericardium / Pleural Effusion   No pericardial effusion visualized. Measurements 2D Measurements ---------------------------------------------------------------------- Name  Value        Normal ---------------------------------------------------------------------- Parasternal 2D ----------------------------------------------------------------------  IVS Diastolic Thickness (2D)                               0.80 cm     0.60-1.00 LVID Diastole (2D)                 7.00 cm     4.20-5.80  LVIW Diastolic Thickness (2D)                               0.60 cm     0.60-1.05 LVID Systole (2D)                  6.40 cm     2.50-4.00 LVOT Diameter                       2.20 cm               LA Dimension (2D)                  4.30 cm     3.00-4.10 Ao Root Diameter (2D)              3.20 cm     2.70-3.70 LV Ejection Fraction 2D ---------------------------------------------------------------------- LV EF (BP MOD)                        21 %         52-72 Apical 2D Dimensions ----------------------------------------------------------------------  RV Basal Diastolic Dimension                          3.70 cm     2.50-4.10 LA Volume Index (BP A-L)        41.09 ml/m2   16.00-34.00 RA Area (4C)                     15.90 cm2       <=18.00 M-mode Measurements ---------------------------------------------------------------------- Name                                 Value        Normal ---------------------------------------------------------------------- M-Mode ---------------------------------------------------------------------- TAPSE                              1.63 cm        >=1.60 LVOT/Aortic Valve Doppler Measurements ---------------------------------------------------------------------- Name                                 Value        Normal ---------------------------------------------------------------------- LVOT Doppler ---------------------------------------------------------------------- LVOT VTI                          11.10 cm               LVOT Peak Velocity                0.73 m/s               LVOT Mean Gradient  1.00 mmHg               AoV Doppler ---------------------------------------------------------------------- AV VTI                            19.50 cm               AV Peak Velocity                  1.06 m/s               AV Peak Gradient                    5 mmHg               AV Mean Gradient                    3 mmHg           <20 AV Area Index (Cont Eq Vel)     1.3 cm2/m2               AV Area (Cont Eq VTI)             2.16 cm2        >=3.00 AV Area Index (Cont Eq VTI)     1.04 cm2/m2               AV V1/V2 Ratio                         0.69 Mitral Valve Measurements ---------------------------------------------------------------------- Name                                 Value        Normal ---------------------------------------------------------------------- MV Doppler ---------------------------------------------------------------------- MV E Peak Velocity                0.95 m/s               MV A Peak Velocity                0.51 m/s               MV E/A                                1.87               MV Regurgitation Doppler ---------------------------------------------------------------------- MR ERO (PISA)                     0.16 cm2               MV Annular TDI ---------------------------------------------------------------------- MV Septal e' Velocity             0.05 m/s               MV E/e' (Septal)                     18.92        <=8.00 MV Lateral e' Velocity            0.10 m/s               MV E/e' (Lateral)  9.18 Tricuspid Valve Measurements ---------------------------------------------------------------------- Name                                 Value        Normal ---------------------------------------------------------------------- TV Regurgitation Doppler ---------------------------------------------------------------------- TR Peak Velocity                  2.60 m/s               RA Pressure                         5 mmHg           <=3 RV Systolic Pressure               32 mmHg           <36 Aorta / Venous Measurements ---------------------------------------------------------------------- Name                                 Value        Normal ---------------------------------------------------------------------- IVC/SVC ---------------------------------------------------------------------- IVC Diameter (Exp 2D)              2.20 cm        <=2.10 Report Signatures Finalized by Grace Blight  MD, [facility] on 09/08/2022 03:38 PM Preliminary by Rhae Lerner on 09/08/2022 11:26 AM    CT Chest Abdomen Pelvis  WO Contrast    Result Date: 09/08/2022  HISTORY:  History of CLL, assess treatment response COMPARISON: 03/18/2022, 03/11/2022 TECHNIQUE: CT of the chest, abdomen, and pelvis performed without intravenous contrast. The following dose reduction techniques were utilized: automated exposure control and/or adjustment of the mA and/or KV according to patient size, and the use of an iterative reconstruction technique. CONTRAST: None. FINDINGS: Please note that evaluation of the viscera and vasculature is limited in the absence of IV contrast. Chest: The previously seen axillary nodes are significantly decreased. Evaluation of the right axilla is slightly limited by streak artifact from the right upper extremity. The left axillary nodes are upper normal measuring up to 1.2 x 0.8 cm. The largest right axillary node on the current examination measures approximately 2.4 x 1.2 cm. Small nonenlarged mediastinal nodes are noted. Coronary artery calcification is noted. A defibrillator is present. Mild bilateral dependent opacities are noted likely atelectasis. There is mild bronchiectasis in the lower lobes. Minimal patchy groundglass opacities noted within the right lower lobe for example image. There is a 4 mm nodule in the right lower lobe,, image 97 series 5 A calcified granuloma is noted within the right lung base. Abdomen/Pelvis: Bilateral renal cysts are noted. There is significant interval decrease in size of the patient's abdominal and pelvic lymphadenopathy. A right inguinal node measures 1.9 x 2.8 cm previously 2.9 x 3.8 cm. Other smaller inguinal and iliac chain nodes are noted. Small nonenlarged retroperitoneal nodes are noted. The spleen is not enlarged. The liver, adrenals, and pancreas are unremarkable. The gallbladder is present and nondistended. Degenerative changes of the spine are noted.     1. Significant interval decrease in size of previously seen abdominal and pelvic lymph nodes with small persistent mildly  enlarged inguinal nodes and small iliac chain nodes. 2. Bibasilar opacities in the dependent portions of the lungs, nonspecific. Rocky Crafts, MD 09/08/2022 1:40 PM    Chest AP Portable  Result Date: 09/04/2022  HISTORY: Chest Pain COMPARISON: 03/12/2022 FINDINGS: Left chest pacemaker/defibrillator. The lungs are clear. No pleural effusion or pneumothorax. Stable enlarged cardiac silhouette. No acute osseous abnormality. Bone island in the anterolateral left sixth rib.      No acute abnormality within the chest.  August Albino 09/04/2022 12:51 PM    Pathology:   Specimens (From admission, onward)      None          Pending Lab Results:   Labs/Images to be followed at your PCP office:   Unresulted Labs       Procedure . . . Date/Time    Blood Flow Cytometry Diagnostic - Monitoring, Leukemia/Lymphoma [[id number]] Collected: 09/07/22 1433     Updated: 09/07/22 1433    Narrative:      FLOW CYTOMETRY: DIAGNOSTIC/MONITORING->Diagnostic  Please collect Lavender tube          Hospitalist:     Signed by: Deloris Ping, MD  09/09/2022 9:36 AM  Time spent for discharge including face-to-face and non-face-to-face:  40 min       *This note was generated by the Epic EMR system/ Dragon speech recognition and may contain inherent errors or omissions not intended by the user. Grammatical errors, random word insertions, deletions, pronoun errors and incomplete sentences are occasional consequences of this technology due to software limitations. Not all errors are caught or corrected. If there are questions or concerns about the content of this note or information contained within the body of this dictation they should be addressed directly with the author for clarification

## 2022-09-09 NOTE — Progress Notes (Signed)
CATH LAB PROCEDURE HANDOFF REPORT    Date Time: 09/09/22 1:50 PM    INDICATIONS:    dyspnea, congestive heart failure, cardiomyopathy, and cardiogenic shock  POST PROCEDURE DEBRIEF:    Right heart cath    Instrument/sponge/needle counts, if applicable:  N/A  Specimen labeling, read labels aloud, including name:  N/A  Any applicable equipment problems to be addressed:  N/A    Per MD:  Key concerns for recovery/management: bleeding/hematoma  ALLERGIES:    Allopurinol   ACC BLEEDING RISK SCORE       MEDICAL HISTORY:      Past Medical History:   Diagnosis Date    Acute CHF     NOV  & DEC 2018, - DIALYSIS CATH PLACED Aug 24 2017 TO REMOVE FLUID     Acute systolic (congestive) heart failure 08/2017    Anxiety     CAD (coronary artery disease)     Cardiomyopathy     nonischemic    CHF (congestive heart failure) 08/12/2014, 2013    Chronic obstructive pulmonary disease     POSSIBLE PER PT HE USES  SYNBICORT BID ABD  BREO INHALER PRN    Coronary artery disease 2011    CORONARY STENT PLACEMENT FOLLOWED BY [facility]    Depression     echocardiogram 02/2016, 11/2016, 09/2017, 12/20/2017    GERD (gastroesophageal reflux disease)     Heart attack 2011    and 2014    Hyperlipemia     Hypertension     ICD (implantable 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     PLACED 2014  [facility]  LAST INTERROGATION April 01 2018 REPORT REQUESTED    Ischemic cardiomyopathy     EF 15% ON ECHO 09-20-2017 DR GARG IN EPIC    Nonischemic cardiomyopathy     NSTEMI (non-ST elevated myocardial infarction)     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    Pacemaker 2014    [employer] ICD/PACEMAKER COMBO LAST INTERROGATION  12-12 2018    Pneumonia 09/2016    Primary cardiomyopathy     Ischemic cardiomyopathy EF 15% ON ECHO 09-20-2017 DR GARG IN EPIC    Sepsis 08/2017    Syncope and collapse     PRIOR TO ICD/PACEMAKER PLACED 2014    Type 2 diabetes mellitus, controlled DX 1998    BS  AVG 100  A1C 7.4 Dec 27 2017    [facility]     PT SEE HIS PMD April 01 2018 RE THIS-TOLD TO SEE PMD BY [facility] JUNE 12 WHEN THEY SAW HIM FOR A CL      ACCESS:    51F sheath in right brachial vein  Hemostasis: Manual pressure at 1413  Visual appearance: clean/dry/intact  MEDICATIONS:    Lidocaine subQ 28mL  Versed: 1 mg IV  Fentanyl: 50 mcg IV  VITALS:    HR:82           Rhythm: sinus rhythm       BP: 102/68   O2 SAT: 95%    room air  PROCEDURE DETAILS:    Outcomes: See physician note                                Last ACT: No results found for: "ISTATACTKAOL"       Final Chest Pain Assessment::0/10    Report given to: Gilford Silvius RN    See Physicians Op note/ Report for details    SAFETY  CHECKLIST   I, Aarnav Steagall Jeanine LuzBaronda, RN, along with [place], have validated the following safety critical items:    Patient ID band present    IV  is patent    Sites dressed    Patient transported on monitor  EKG attached  Pulse oximetry attached    Last vital signs stable and documented

## 2022-09-09 NOTE — H&P (Signed)
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  Patient had issues with low systolic blood pressures which limited the use of guidance directed medical therapy but did tolerate diuretics with weight loss.  It was unclear if patient still needed diuresis and was sent to [facility] for right heart cath to evaluate fluid status and restratification.  Patient is not a heart transplant candidate but can be evaluated for LVAD.    Plan:   1.  Acute on chronic systolic heart failure, most recent ejection fraction 21% with negative cardiac catheterization in the past, status post ICD whose course has been complicated by significant hypotension limiting the use of goal-directed medical therapy.  Patient was transferring from [facility] to [facility] for right heart catheterization to evaluate fluid fluid status, risk ratification, and possibility for candidate for LVAD.  2.  CKD stage IIIb  3.  Stage IV CLL on CALQUENCE( acalabrutinib) [place] [facility] specialist, patient seen by Dr. Laurine Blazer on 11/22.  CT chest abdomen pelvis shows significant decrease in lymphadenopathy showing that the chemotherapy is working.  Dr. Cam Hai recommends continuing this medication.   Furthermore there is mention of interaction between Calquence and blood thinners however patient has had no significant issues with bleeding despite being on aspirin Eliquis and Calquence.  Of note interestingly CT chest does not show any pulmonary edema or pleural effusions.  4.  Insulin-dependent diabetes on Jardiance.  Patient on 50 units of Lantus at home.  This was decreased to 40 units at night while at [facility]  Blood sugars were relatively well controlled as long as patient was eating however on 11/20 patient was n.p.o. and had hypoglycemia.  Change patient's glucose regimen to 10 units Lantus at night with 6 units 3 times daily AC and this can be increased as needed to control blood sugars but avoid hypoglycemia.  5.  History of right lower extremity DVT May 2023 was on Eliquis.  Patient had a more recent Doppler of lower extremities bilateral on July 14 of this year showing no DVTs.  Eliquis has been held prior to transfer for right heart catheterization.  Likely can be restarted, patient has any concern about bleeding in the past may also consider discontinue this medication that has been 6 months since starting treatment and most recent Dopplers have been negative.  6.  COPD-continue bronchodilators, no wheezing on examination  7. DVT - prophylaxis - eliquis on hold, consider what plan may be for further interventions can decide if SC heparin vs restarting eliquis   8. FULL code - patient wants to live as long as possible as her reports good quality of life at this time        Patient has BMI=There is no height or weight on file to calculate BMI.  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  Patient had issues with low systolic blood pressures which limited the use of guidance directed medical therapy but did tolerate diuretics with weight loss.  It was unclear if patient still needed diuresis and was sent to [facility] for right heart cath to evaluate fluid status and restratification.  Patient is not a heart transplant candidate but can be evaluated for LVAD.    And has no complaints at this time.  Patient was seen and on arrival by Dr. Wilber Oliphant of [facility].  patient is being transported to the right heart catheterization at this time.    Past Medical History:     Past Medical History:   Diagnosis Date    Acute CHF     NOV  & DEC 2018, - DIALYSIS CATH PLACED Aug 24 2017 TO REMOVE FLUID     Acute systolic (congestive) heart failure 08/2017    Anxiety     CAD (coronary artery disease)     Cardiomyopathy     nonischemic    CHF (congestive heart failure) 08/12/2014, 2013    Chronic obstructive pulmonary disease     POSSIBLE PER PT HE USES  SYNBICORT BID ABD  BREO INHALER PRN    Coronary artery disease 2011    CORONARY STENT PLACEMENT FOLLOWED BY [facility]    Depression     echocardiogram 02/2016, 11/2016, 09/2017, 12/20/2017    GERD (gastroesophageal reflux disease)     Heart attack 2011    and 2014     Hyperlipemia     Hypertension     ICD (implantable 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     PLACED 2014  [facility]  LAST INTERROGATION April 01 2018 REPORT REQUESTED    Ischemic cardiomyopathy     EF 15% ON ECHO 09-20-2017 DR GARG IN EPIC    Nonischemic cardiomyopathy     NSTEMI (non-ST elevated myocardial infarction)     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    Pacemaker 2014    MEDTRONIC ICD/PACEMAKER COMBO LAST INTERROGATION  12-12 2018    Pneumonia 09/2016    Primary cardiomyopathy     Ischemic cardiomyopathy EF 15% ON ECHO 09-20-2017 DR GARG IN EPIC    Sepsis 08/2017    Syncope and collapse     PRIOR TO ICD/PACEMAKER PLACED 2014    Type 2 diabetes mellitus, controlled DX 1998    BS  AVG 100  A1C 7.4 Dec 27 2017    [place]  PT SEE HIS PMD April 01 2018 RE THIS-TOLD TO SEE PMD BY [facility] JUNE 12 WHEN THEY SAW HIM FOR A CL       Available old records reviewed, including:  epic    Past Surgical History:     Past Surgical History:   Procedure Laterality Date    BIV  11/10/2016    ICD METRONIC  UPGRADE     CARDIAC CATHETERIZATION  03/2010    CORONARY STENT PLACEMENT X 2 PER PT; LM normal, LAD 70-80% distal lesion at apex, 20% lesion mid CFX    CARDIAC CATHETERIZATION  12/2012    CARDIAC DEFIBRILLATOR PLACEMENT  12/2012    [facility]    CARDIAC PACEMAKER PLACEMENT  2014    MEDTRONIC ICD/PACEMAKER PLACED [facility]    CIRCUMCISION  AGE 30    COLONOSCOPY  2009    CORRECTION HAMMER TOE      duodenal ulcer  1973    STRESS RELATED IN SCHOOL    ECHOCARDIOGRAM, TRANSTHORACIC  02/2016 11/2016 09/2017 12/2017    ECHOCARDIOGRAM, TRANSTHORACIC  02/2016,11/2016,12/20/2017    EGD N/A 08/15/2014    Procedure: EGD;  Surgeon: Colon Branch, MD;  [facility];  Service: Gastroenterology;  Laterality: N/A;  egd w/ bx    EGD  1975    EGD, COLONOSCOPY N/A 04/04/2018    Procedure: EGD with bxs, COLONOSCOPY with polypectomy and clipping;  Surgeon: Doyne Keel, MD;  Location: [facility];  Service: Gastroenterology;  Laterality:  N/A;    FRACTURE SURGERY  AGE 28     RT  ANKLE- CAST APPLIED    HERNIA REPAIR  AGE 30    LEFT INGUINAL HERNIA    MPI nuclear study  06/2016    orthopedic surgery  AGE 52    RT foot corrective - HAMMERTOE    TONSILLECTOMY AND ADENOIDECTOMY  1954       Family History:   Reviewed     Social History:     Social History     Tobacco Use   Smoking Status Never   Smokeless Tobacco Never     Social History     Substance and Sexual Activity   Alcohol Use Not Currently     Social History     Substance and Sexual Activity   Drug Use No       Allergies:     Allergies   Allergen Reactions    Allopurinol        Medications:     Home Medications               apixaban (ELIQUIS) 5 MG     Take 2 tablets (10 mg) by mouth every 12 (twelve) hours For 1 week followed by 1 tablet twice daily     aspirin EC 81 MG EC tablet     Take 1 tablet (81 mg) by mouth daily     atorvastatin (LIPITOR) 40 MG tablet     Take 1 tablet (40 mg total) by mouth nightly     Calquence 100 MG Tab          Jardiance 10 MG tablet     TAKE 1 TABLET(10 MG) BY MOUTH DAILY     loratadine (CLARITIN) 10 MG tablet     Take 1 tablet (10 mg) by mouth daily     metOLazone (ZAROXOLYN) 10 MG tablet     Take 1 tablet (10 mg) by mouth daily  metoprolol succinate XL (TOPROL-XL) 25 MG 24 hr tablet     Take 0.5 tablets (12.5 mg) by mouth daily     niacin (NIASPAN) 500 MG CR tablet     TAKE 1 TABLET BY MOUTH TWICE DAILY     Patient taking differently: 250 mg Taking half a tablet     Semglee, yfgn, 100 UNIT/ML Solution Pen-injector     ADMINISTER 50 UNITS UNDER THE SKIN AT BEDTIME     SITagliptin-metFORMIN (Janumet) 50-500 MG tablet     Take 1 tablet by mouth 2 (two) times daily with meals     torsemide (DEMADEX) 20 MG tablet     Take 3 tablets (60 mg) by mouth 2 (two) times daily     valsartan (DIOVAN) 40 MG tablet     Take 0.5 tablets (20 mg) by mouth daily             Patient not taking:                Method by which medications were confirmed on admission: epic    Review of  Systems:   All other systems were reviewed and are negative except: as in HPI    Physical Exam:   Patient Vitals for the past 24 hrs:   Pulse   09/09/22 1306 90     There is no height or weight on file to calculate BMI.  No intake or output data in the 24 hours ending 09/09/22 1328    General: awake, alert, oriented x 3; no acute distress.  HEENT: perrla, eomi, sclera anicteric  oropharynx clear without lesions, mucous membranes moist  Neck: supple, no lymphadenopathy, no thyromegaly, no JVD, no carotid bruits  Cardiovascular: regular rate and rhythm, no murmurs, rubs or gallops  Lungs: clear to auscultation bilaterally, without wheezing, rhonchi, or rales  Abdomen: soft, non-tender, non-distended; no palpable masses, no hepatosplenomegaly, normoactive bowel sounds, no rebound or guarding  Extremities: no clubbing, cyanosis, or edema  Neuro: cranial nerves grossly intact, strength 5/5 in upper and lower extremities, sensation intact  Skin: no rashes or lesions noted        Labs:     09/09/22  CBC normal except WBC 11.99    CMP normal except BUN 58, Cr 2.3, GFR 28.8 - Cl - 39, CO2 31  Glucose 196  T bili 2.3 - normal LFT and Alk phos     Imaging personally reviewed, including:     11/21/023 CT chest abd/pelvis wo contrast   1. Significant interval decrease in size of previously seen abdominal and  pelvic lymph nodes with small persistent mildly enlarged inguinal nodes and  small iliac chain nodes.  2. Bibasilar opacities in the dependent portions of the lungs, nonspecific.    09/08/2022   Echocardiogram   * Markedly dilated left ventricle with severely reduced systolic function  (LVEDVi 109 mL/m2, ejection fraction 21%).    * Hypertrabeculated left ventricle with severe global hypokinesis.    * Mild aortic valve and moderate mitral valve regurgitation (details below).    * Wire in right-sided chambers with mild to moderate (2+) peri-device  tricuspid valve regurgitation.    * Dilated, poorly responsive IVC.    * When  visually compared with the prior study dated 05/29/2021, LV function  is less vigorous (previous EF 31%, now 21%; previous LVOT VTI 17 cm, now 11  cm); valvular regurgitation is less prominent.  The right ventricular cavity size is normal in size. Normal right  ventricular systolic function.    Safety Checklist  DVT prophylaxis:  CHEST guideline (See page e199S) Mechanical   Foley:  [place] Rn Foley protocol Not present   IVs:  Peripheral IV   PT/OT: Not needed   Daily CBC & or Chem ordered:  SHM/ABIM guidelines (see #5) Yes, due to clinical and lab instability       Signed by: Antionette Poles, MD  HC:[id number], Bonnetta Barry, MD

## 2022-09-09 NOTE — Procedures (Addendum)
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     Conclusions:    Normal PCWP.  CI 2.0 by Fick and 1.7 by Thermal.  Venous Lactate: 0.9.  Hemodynamics: RA 7. RV 39/4. PA 41/21/29. PCW 14/18/14. Sats: RA 59%, PA 62%, AO 95%, Room Air. Fick CO/CI 4.0/2.0, TD CO/CI 3.5/1.7. HR 80, BP (cuff) 115/72/89.  No complications.  Minimal EBL.  Pain-free, alert-oriented, hemodynamically-stable post-procedure.  Right brachial vein sheath removed and hemostasis achieved with manual compression.    Recommendations:    No current indication for leave-in [facility] or CICU admission.  Defer ongoing management to Hospitalist Service and Advanced Heart Failure Team.    Dictation #[id number]      --------------------  Burna Forts, MD  Interventional Cardiology  [facility]  SL [phone]

## 2022-09-09 NOTE — H&P (Addendum)
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    H&P:    The history and physical including past medical, family, and social history were reviewed   and there are no significant interval changes from what is currently available in the chart  from prior evaluation. He has no complaints.  He was seen and examined by me prior   to the procedure.   ALLERGIES:    Allopurinol   LABS:      Lab Results   Component Value Date    WBC 11.99 (H) 09/09/2022    HGB 14.3 09/09/2022    HCT 41.6 09/09/2022    PLT 170 09/09/2022    NA 135 09/09/2022    K 3.7 09/09/2022    CL 89 (L) 09/09/2022    CO2 31 (H) 09/09/2022    MG 2.3 09/08/2022    BUN 58.0 (H) 09/09/2022    CREAT 2.3 (H) 09/09/2022    EGFR 28.8 (A) 09/09/2022    GLU 196 (H) 09/09/2022       ASA PHYSICAL STATUS    Class 3 - Severe systemic disease, limits normal activity but not incapacitating  MALLAMPATTI AIRWAY CLASSIFICATION    Class III: Soft and hard palate and base of the uvula are visible  PLANNED SEDATION:    ( ) NO SEDATION   (x) MODERATE SEDATION   ( ) DEEP SEDATION WITH ANESTHESIA   CONCLUSION:    The risks, benefits and alternatives of the procedure have been discussed in detail and   he has indicated that he understands the procedure, indications, and risks inherent to the   procedure and is amenable to proceeding.  All questions were answered. Informed   consent was signed and verified.      Signed by: Burna Forts, MD

## 2022-09-10 ENCOUNTER — Inpatient Hospital Stay: Payer: Commercial Managed Care - POS

## 2022-09-10 DIAGNOSIS — R0600 Dyspnea, unspecified: Secondary | ICD-10-CM

## 2022-09-10 DIAGNOSIS — Z9889 Other specified postprocedural states: Secondary | ICD-10-CM

## 2022-09-10 DIAGNOSIS — N1832 Chronic kidney disease, stage 3b: Secondary | ICD-10-CM

## 2022-09-10 DIAGNOSIS — E782 Mixed hyperlipidemia: Secondary | ICD-10-CM

## 2022-09-10 DIAGNOSIS — I25810 Atherosclerosis of coronary artery bypass graft(s) without angina pectoris: Secondary | ICD-10-CM

## 2022-09-10 DIAGNOSIS — I255 Ischemic cardiomyopathy: Secondary | ICD-10-CM

## 2022-09-10 DIAGNOSIS — Z95810 Presence of automatic (implantable) cardiac defibrillator: Secondary | ICD-10-CM

## 2022-09-10 DIAGNOSIS — Z01810 Encounter for preprocedural cardiovascular examination: Secondary | ICD-10-CM

## 2022-09-10 LAB — CBC AND DIFFERENTIAL
Absolute NRBC: 0 10*3/uL (ref 0.00–0.00)
Basophils Absolute Automated: 0.01 10*3/uL (ref 0.00–0.08)
Basophils Automated: 0.1 %
Eosinophils Absolute Automated: 0.13 10*3/uL (ref 0.00–0.44)
Eosinophils Automated: 1 %
Hematocrit: 42.6 % (ref 37.6–49.6)
Hgb: 14.1 g/dL (ref 12.5–17.1)
Immature Granulocytes Absolute: 0.03 10*3/uL (ref 0.00–0.07)
Immature Granulocytes: 0.2 %
Instrument Absolute Neutrophil Count: 4.17 10*3/uL (ref 1.10–6.33)
Lymphocytes Absolute Automated: 8.01 10*3/uL — ABNORMAL HIGH (ref 0.42–3.22)
Lymphocytes Automated: 63.4 %
MCH: 32.9 pg (ref 25.1–33.5)
MCHC: 33.1 g/dL (ref 31.5–35.8)
MCV: 99.3 fL — ABNORMAL HIGH (ref 78.0–96.0)
MPV: 12.4 fL (ref 8.9–12.5)
Monocytes Absolute Automated: 0.29 10*3/uL (ref 0.21–0.85)
Monocytes: 2.3 %
Neutrophils Absolute: 4.17 10*3/uL (ref 1.10–6.33)
Neutrophils: 33 %
Nucleated RBC: 0 /100 WBC (ref 0.0–0.0)
Platelets: 164 10*3/uL (ref 142–346)
RBC: 4.29 10*6/uL (ref 4.20–5.90)
RDW: 16 % — ABNORMAL HIGH (ref 11–15)
WBC: 12.64 10*3/uL — ABNORMAL HIGH (ref 3.10–9.50)

## 2022-09-10 LAB — RAPID DRUG SCREEN, URINE
Barbiturate Screen, UR: NEGATIVE
Benzodiazepine Screen, UR: POSITIVE — AB
Cannabinoid Screen, UR: NEGATIVE
Cocaine, UR: NEGATIVE
Opiate Screen, UR: NEGATIVE
PCP Screen, UR: NEGATIVE
Urine Amphetamine Screen: NEGATIVE
Urine Fentanyl: NEGATIVE

## 2022-09-10 LAB — ABO/RH: ABO Rh: O POS

## 2022-09-10 LAB — GLUCOSE WHOLE BLOOD - POCT
Whole Blood Glucose POCT: 168 mg/dL — ABNORMAL HIGH (ref 70–100)
Whole Blood Glucose POCT: 265 mg/dL — ABNORMAL HIGH (ref 70–100)
Whole Blood Glucose POCT: 187 mg/dL — ABNORMAL HIGH (ref 70–100)

## 2022-09-10 LAB — HEPATIC FUNCTION PANEL
ALT: 11 U/L (ref 0–55)
AST (SGOT): 15 U/L (ref 5–41)
Albumin/Globulin Ratio: 0.9 (ref 0.9–2.2)
Albumin: 3.5 g/dL (ref 3.5–5.0)
Alkaline Phosphatase: 83 U/L (ref 37–117)
Bilirubin Direct: 0.7 mg/dL — ABNORMAL HIGH (ref 0.0–0.5)
Bilirubin Indirect: 1.4 mg/dL — ABNORMAL HIGH (ref 0.2–1.0)
Bilirubin, Total: 2.1 mg/dL — ABNORMAL HIGH (ref 0.2–1.2)
Globulin: 3.7 g/dL — ABNORMAL HIGH (ref 2.0–3.6)
Protein, Total: 7.2 g/dL (ref 6.0–8.3)

## 2022-09-10 LAB — LACTIC ACID, PLASMA
Lactic Acid: 2.3 mmol/L — ABNORMAL HIGH (ref 0.2–2.0)
Lactic Acid: 2.2 mmol/L — ABNORMAL HIGH (ref 0.2–2.0)
Lactic Acid: 2.5 mmol/L — ABNORMAL HIGH (ref 0.2–2.0)

## 2022-09-10 LAB — BASIC METABOLIC PANEL
Anion Gap: 16 — ABNORMAL HIGH (ref 5.0–15.0)
BUN: 59 mg/dL — ABNORMAL HIGH (ref 9.0–28.0)
CO2: 27 mEq/L (ref 17–29)
Calcium: 9.8 mg/dL (ref 7.9–10.2)
Chloride: 94 mEq/L — ABNORMAL LOW (ref 99–111)
Creatinine: 2.2 mg/dL — ABNORMAL HIGH (ref 0.5–1.5)
Glucose: 154 mg/dL — ABNORMAL HIGH (ref 70–100)
Potassium: 4.2 mEq/L (ref 3.5–5.3)
Sodium: 137 mEq/L (ref 135–145)
eGFR: 30.3 mL/min/{1.73_m2} — AB (ref >=60)

## 2022-09-10 LAB — PREALBUMIN: Prealbumin: 21.7 mg/dL (ref 18.0–45.0)

## 2022-09-10 LAB — MAGNESIUM: Magnesium: 2.6 mg/dL (ref 1.6–2.6)

## 2022-09-10 LAB — HEPATITIS B CORE ANTIBODY, TOTAL: Hepatitis B Core Total AB: NONREACTIVE

## 2022-09-10 LAB — HEPATITIS B SURFACE ANTIGEN W/ REFLEX TO CONFIRMATION: Hepatitis B Surface Antigen: NONREACTIVE

## 2022-09-10 LAB — ECG 12-LEAD
Atrial Rate: 78 {beats}/min
P Axis: 65 degrees
P-R Interval: 160 ms
Q-T Interval: 434 ms
QRS Duration: 90 ms
QTC Calculation (Bezet): 494 ms
R Axis: -20 degrees
T Axis: 106 degrees
Ventricular Rate: 78 {beats}/min

## 2022-09-10 LAB — PT AND APTT
PT INR: 1.5 — ABNORMAL HIGH (ref 0.9–1.1)
PT: 16.9 s — ABNORMAL HIGH (ref 10.1–12.9)
PTT: 36 s (ref 27–39)

## 2022-09-10 LAB — HEPATITIS C ANTIBODY: Hepatitis C, AB: NONREACTIVE

## 2022-09-10 LAB — PROBNP: NT-proBNP: 2051 pg/mL — ABNORMAL HIGH (ref 0–450)

## 2022-09-10 LAB — LACTATE DEHYDROGENASE: LDH: 327 U/L (ref 120–331)

## 2022-09-10 LAB — HEPATITIS B SURFACE ANTIBODY: HEPATITIS B SURFACE ANTIBODY: <3.31 m[IU]/mL

## 2022-09-10 LAB — C-REACTIVE PROTEIN: C-Reactive Protein: 4.5 mg/dL — ABNORMAL HIGH (ref 0.0–1.1)

## 2022-09-10 LAB — HIV-1/2 AG/AB 4TH GEN. W/ REFLEX: HIV Ag/Ab, 4th Generation: NONREACTIVE

## 2022-09-10 NOTE — Consults (Signed)
Advanced Heart Failure and Transplant History & Physical    Epic Chat Group: '[place] [facility] Advanced Heart Failure'    Primary Cardiologist: Marianna Fuss, MD  CV Surgeon: N/A  Referring Physician: Greggory Brandy, MD     Listed for transplant: N/A    Assessment:   76 y.o M with PMH CLL (on Calq) HLD, DM, recent RLE DVT, CKD/AKI, hx CLL, dilated NICM, severe LV dysfunction, admit recurrent acute-on-chronic systolic HF, with Lactate elevation per OSH labs concerning for possible CS, referred for RHC.    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  If it keeps rising than worried more about a failure of oral therapy.    Recommendations:     Heart Failure Medical Therapy  BB: Metoprolol 12.5mg  daily  ACE/ARB/ARNI: Hold d/t AKI  SGLT2i: Hold Jardiance given low CrCl  MRA: Hold d/t AKI  Diuresis: Torsemide 60mg  BID   Hydralazine / Nitrates  Cardiac Device (ICD/ Cardiomems): Medtronic ICD  Home Regiment: Metoprolol 12.5mg , Torsemide 60mg  BID, Metolazone 10mg  daily, Valsartan 20mg  daily, Jardiance 10mg     NICM, EF 28%  RHC CI: 2.0 by Fick and 1.7 by thermal  Hemodynamics: RA 7. RV 39/4. PA 41/21/29. PCW 14/18/14. Sats: RA 59%, PA 62%, AO 95%, Room Air. Fick CO/CI 4.0/2.0, TD CO/CI 3.5/1.7. HR 80, BP (cuff) 115/72/89.  BP hold parameter to hold for SBP<88  AKI on CKD3a (baseline Cr 2.0, at baseline)  Avoid nephrotoxic medications  Hold ACEi/ARB/ARNI, MRA and Jardiance  T2DM, insulin-dependent  RLE DVT (02/2022)  Continue Eliquis    Signed by: Marko Stai, MD           Subjective:   CC:  Advanced HF therapies evaluation  Feels tolerant of medications  Less dyspnea today    HPI:    [name] is a 76 y.o.  male with PMH of CLL (on Calq) HLD, DM, recent RLE DVT, CKD/AKI, hx CLL, dilated NICM, severe LV dysfunction, admit recurrent acute-on-chronic systolic HF who was sent to [facility] from Oncology office 09/04/2022.    Patient had a follow up appointment with his Oncologist on 09/04/2022 when he was noted to be dyspneic. He had gained weight 6-8lbs over the past 3 weeks, had increased abdominal girth and lower extremity edema. Per wife, patient has not been taking his home medications consistently for the past 3 weeks.     On arrival to [facility] 09/04/2022, labs revealed WBC 13.15, H/H 12.6/39.1, Platelets 118, Na 140, K 4.5, BUN/Cr 46/2.0, INR 1.5, BNP 11,362, LA 2.0 (11/21), and hs Troponin 58.9. Vital signs showed temp 97.8, HR 89, BP 120/77. Studies include CXR, TTE and CTA CAP done. He denies any ICD discharges.     ROS:  All other systems were reviewed and are otherwise negative except as noted in the HPI.          Patient has BMI=Body mass index is 25.01 kg/m.  Diagnosis: Overweight based on BMI criteria    Patient Active Problem List   Diagnosis    Type 2 diabetes mellitus with complication, with long-term current use of insulin    Heart attack    CAD (coronary artery disease)  Non-ischemic cardiomyopathy    Acute exacerbation of CHF (congestive heart failure)    PUD (peptic ulcer disease)    Acute dyspnea    CHF (congestive heart failure)    Headaches due to old head injury    Essential hypertension    Syncope and collapse    AICD (automatic cardioverter/defibrillator) present    Cardiomyopathy    SOB (shortness of breath)    GERD (gastroesophageal reflux disease)    Hypertensive heart disease without heart failure    Heart failure, unspecified    Nonischemic cardiomyopathy    Pure hypercholesterolemia    Chronic systolic congestive heart failure    Encounter for implantable defibrillator reprogramming or check    Episode of recurrent major depressive disorder, unspecified depression episode severity    Stage 3  chronic kidney disease, 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    Anxiety and depression    COPD (chronic obstructive pulmonary disease)    HLD (hyperlipidemia)    Sleep apnea, obstructive    Scrotal edema    Hydrocele, unspecified hydrocele type    Acute on chronic congestive heart failure, unspecified heart failure type    Deep vein thrombosis (DVT) of other vein of right lower extremity, unspecified chronicity    Acute on chronic right-sided heart failure    Ischemic cardiomyopathy       Past Medical History:  Past Medical History:   Diagnosis Date    Acute CHF     NOV  & DEC 2018, - DIALYSIS CATH PLACED Aug 24 2017 TO REMOVE FLUID     Acute systolic (congestive) heart failure 08/2017    Anxiety     CAD (coronary artery disease)     Cardiomyopathy     nonischemic    CHF (congestive heart failure) 08/12/2014, 2013    Chronic obstructive pulmonary disease     POSSIBLE PER PT HE USES  SYNBICORT BID ABD  BREO INHALER PRN    Coronary artery disease 2011    CORONARY STENT PLACEMENT FOLLOWED BY [facility]    Depression     echocardiogram 02/2016, 11/2016, 09/2017, 12/20/2017    GERD (gastroesophageal reflux disease)     Heart attack 2011    and 2014    Hyperlipemia     Hypertension     ICD (implantable 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     PLACED 2014  [facility]  LAST INTERROGATION April 01 2018 REPORT REQUESTED    Ischemic cardiomyopathy     EF 15% ON ECHO 09-20-2017 DR GARG IN EPIC    Nonischemic cardiomyopathy     NSTEMI (non-ST elevated myocardial infarction)     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    Pacemaker 2014    MEDTRONIC ICD/PACEMAKER COMBO LAST INTERROGATION  12-12 2018    Pneumonia 09/2016    Primary cardiomyopathy     Ischemic cardiomyopathy EF 15% ON ECHO 09-20-2017 DR GARG IN EPIC    Sepsis 08/2017    Syncope and collapse     PRIOR TO ICD/PACEMAKER PLACED 2014    Type 2 diabetes mellitus, controlled DX 1998    BS  AVG 100  A1C 7.4 Dec 27 2017    [place]     PT SEE HIS PMD April 01 2018 RE THIS-TOLD TO SEE PMD BY [facility]  JUNE 12 WHEN THEY SAW HIM FOR A CL        Past Surgical History:  Past Surgical History:   Procedure Laterality Date    BIV  11/10/2016  ICD METRONIC  UPGRADE     CARDIAC CATHETERIZATION  03/2010    CORONARY STENT PLACEMENT X 2 PER PT; LM normal, LAD 70-80% distal lesion at apex, 20% lesion mid CFX    CARDIAC CATHETERIZATION  12/2012    CARDIAC DEFIBRILLATOR PLACEMENT  12/2012    [facility]    CARDIAC PACEMAKER PLACEMENT  2014    MEDTRONIC ICD/PACEMAKER PLACED [facility]    CIRCUMCISION  AGE 34    COLONOSCOPY  2009    CORRECTION HAMMER TOE      duodenal ulcer  1973    STRESS RELATED IN SCHOOL    ECHOCARDIOGRAM, TRANSTHORACIC  02/2016 11/2016 09/2017 12/2017    ECHOCARDIOGRAM, TRANSTHORACIC  02/2016,11/2016,12/20/2017    EGD N/A 08/15/2014    Procedure: EGD;  Surgeon: Colon Branch, MD;  Location: [facility];  Service: Gastroenterology;  Laterality: N/A;  egd w/ bx    EGD  1975    EGD, COLONOSCOPY N/A 04/04/2018    Procedure: EGD with bxs, COLONOSCOPY with polypectomy and clipping;  Surgeon: Doyne Keel, MD;  Location: [facility];  Service: Gastroenterology;  Laterality: N/A;    FRACTURE SURGERY  AGE 20     RT  ANKLE- CAST APPLIED    HERNIA REPAIR  AGE 34    LEFT INGUINAL HERNIA    MPI nuclear study  06/2016    orthopedic surgery  AGE 29    RT foot corrective - HAMMERTOE    TONSILLECTOMY AND ADENOIDECTOMY  1954          Current Meds:  apixaban, 5 mg, Oral, Q12H SCH  aspirin EC, 81 mg, Oral, Daily  atorvastatin, 40 mg, Oral, QHS  fluticasone furoate-vilanterol, 1 puff, Inhalation, QAM  hydrALAZINE, 10 mg, Oral, Q8H SCH  insulin glargine, 18 Units, Subcutaneous, QHS  insulin lispro, 1-4 Units, Subcutaneous, QHS  insulin lispro, 1-8 Units, Subcutaneous, TID AC  insulin lispro, 6 Units, Subcutaneous, TID AC  isosorbide dinitrate, 10 mg, Oral, TID - Nitrate Free Interval  metoprolol succinate XL, 12.5 mg, Oral, Daily  acalabrutinib, 100 mg, Oral, Q12H SCH  potassium chloride, 40 mEq, Oral,  BID  torsemide, 60 mg, Oral, BID        Drips:      Home Meds:  Home Medications       Med List Status: Complete Set By: Orion Modest, RN at 09/09/2022  3:09 PM              apixaban (ELIQUIS) 5 MG     Take 2 tablets (10 mg) by mouth every 12 (twelve) hours For 1 week followed by 1 tablet twice daily     aspirin EC 81 MG EC tablet     Take 1 tablet (81 mg) by mouth daily     atorvastatin (LIPITOR) 40 MG tablet     Take 1 tablet (40 mg total) by mouth nightly     Notes:  PLEASE ADVISE PATIENT TO SCHEDULE OVERDUE FOLLOW-UP WITH GENERAL CARDIOLOGY (DR. Eliseo Gum) FOR CONTINUED MANAGEMENT/REFILLS.     Calquence 100 MG Tab          Notes:  Pt would like to discuss possible medication side effects.     Jardiance 10 MG tablet     TAKE 1 TABLET(10 MG) BY MOUTH DAILY     loratadine (CLARITIN) 10 MG tablet     Take 1 tablet (10 mg) by mouth daily     metOLazone (ZAROXOLYN) 10 MG tablet     Take  1 tablet (10 mg) by mouth daily     metoprolol succinate XL (TOPROL-XL) 25 MG 24 hr tablet     Take 0.5 tablets (12.5 mg) by mouth daily     niacin (NIASPAN) 500 MG CR tablet     TAKE 1 TABLET BY MOUTH TWICE DAILY     Patient taking differently: 250 mg Taking half a tablet     Semglee, yfgn, 100 UNIT/ML Solution Pen-injector     ADMINISTER 50 UNITS UNDER THE SKIN AT BEDTIME     SITagliptin-metFORMIN (Janumet) 50-500 MG tablet     Take 1 tablet by mouth 2 (two) times daily with meals     torsemide (DEMADEX) 20 MG tablet     Take 3 tablets (60 mg) by mouth 2 (two) times daily     valsartan (DIOVAN) 40 MG tablet     Take 0.5 tablets (20 mg) by mouth daily         Notes:       Patient not taking:                 Allergies:  Allergies   Allergen Reactions    Allopurinol        Family History:  Family History   Problem Relation Age of Onset    Breast cancer Mother     Heart attack Mother 37    Hypertension Mother     Diabetes Mother     Coronary artery disease Mother     Diabetes Brother     Heart attack Father     Hypertension Father         Social History:  Social History     Socioeconomic History    Marital status: Married     Spouse name: Teacher, music    Number of children: 0   Occupational History    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, [place]    Tobacco Use    Smoking status: Never    Smokeless tobacco: Never   Vaping Use    Vaping Use: Never used   Substance and Sexual Activity    Alcohol use: Not Currently    Drug use: No    Sexual activity: Yes     Partners: Female     Social Determinants of Health     Food Insecurity: No Food Insecurity (09/04/2022)    Hunger Vital Sign     Worried About Running Out of Food in the Last Year: Never true     Ran Out of Food in the Last Year: Never true   Transportation Needs: No Transportation Needs (09/04/2022)    PRAPARE - Therapist, art (Medical): No     Lack of Transportation (Non-Medical): No   Intimate Partner Violence: Not At Risk (09/04/2022)    Humiliation, Afraid, Rape, and Kick questionnaire     Fear of Current or Ex-Partner: No     Emotionally Abused: No     Physically Abused: No     Sexually Abused: No   Housing Stability: Low Risk  (09/04/2022)    Housing Stability Vital Sign     Unable to Pay for Housing in the Last Year: No     Number of Places Lived in the Last Year: 1     Unstable Housing in the Last Year: No          Objective:   Vital Signs in last 24 hours:  Temp:  [97.5 F (36.4 C)-98.8 F (37.1 C)] 97.7  F (36.5 C)  Heart Rate:  [78-95] 80  Resp Rate:  [18-19] 18  BP: (83-112)/(51-72) 84/51  SpO2: 93 %  O2 Device: None (Room air)    Telemetry: NSR    Hemodynamics: RA 7. RV 39/4. PA 41/21/29. PCW 14/18/14. Sats: RA 59%, PA 62%, AO 95%, Room Air. Fick CO/CI 4.0/2.0, TD CO/CI 3.5/1.7. HR 80, BP (cuff) 115/72/89.    No data found.      Intake / Output:    Intake/Output Summary (Last 24 hours) at 09/10/2022 1703  Last data filed at 09/10/2022 1617  Gross per 24 hour   Intake 1436 ml   Output 1450 ml   Net -14 ml          Weight:      09/09/2022     3:12 AM 09/09/2022      1:00 PM 09/10/2022     4:02 AM   Weight Monitoring   Height  182.9 cm    Height Method  Stated    Weight 84.097 kg 83.87 kg 83.643 kg   Weight Method Standing Scale Standing Scale Standing Scale   BMI (calculated)  25.1 kg/m2         Weight change:        Physical Exam:   General: well-developed, alert, NAD  HEENT: conjunctiva pale, buccal mucosa moist, anicteric sclera, no erythema, JVP < 10 cm H2O, no carotid bruits  Lungs: CTA B/L, no rhonchi, wheezes or crackles with normal rate and effort  Cardiovascular: S1, S2, normal rate, regular rhythm, no m/r/g  Abdominal: soft, NT/ND, +B.S., no HSM  Extremities: warm to touch, no cyanosis, no edema, peripheral pulses present bilaterally  Neuromuscular exam: alert and oriented x 3, speech fluent   Skin: warm     LDAs:  Patient Lines/Drains/Airways Status       Active Lines, Drains and Airways       None                      Labs:  CBC        09/10/22  0419 09/09/22  0454 09/05/22  0758   WBC 12.64* 11.99* 14.86*   Hgb 14.1 14.3 12.2*   Hematocrit 42.6 41.6 38.5   Platelets 164 170 118*         BMP        09/10/22  1400 09/10/22  0724 09/10/22  0419 09/09/22  0454 09/08/22  1718 09/08/22  0743 09/07/22  1433 09/07/22  0657   BUN  --   --  59.0* 58.0*  --  55.0*  --  53.0*   Creatinine  --   --  2.2* 2.3*  --  2.1*  --  1.9*   eGFR  --   --  30.3* 28.8*  --  32.1*  --  36.2*   Sodium  --   --  137 135  --  140  --  139   Potassium  --   --  4.2 3.7  --  3.4*  --  4.3   Chloride  --   --  94* 89*  --  92*  --  98*   CO2  --   --  27 31*  --  36*  --  29   Calcium  --   --  9.8 9.5  --  9.8  --  9.4   Magnesium  --   --  2.6  --   --  2.3  --  2.2   Lactic Acid 2.5* 2.2* 2.3*  --    < >  --   --   --    NT-proBNP  --   --  2,051*  --   --   --   --   --    LDH  --   --  327  --   --   --  190  --     < > = values in this interval not displayed.         LFTs        09/10/22  0419 09/09/22  0454 09/08/22  0743   AST (SGOT) 15 18 18    ALT 11 14 12    Alkaline Phosphatase 83  98 80   Bilirubin, Total 2.1* 2.2* 1.9*   Bilirubin Direct 0.7* 0.7* 0.7*   Bilirubin Indirect 1.4* 1.5* 1.2*         Coags        09/10/22  0419 09/07/22  1433   PT 16.9* 17.4*   PT INR 1.5* 1.5*   PTT 36 38       Lactic Acid   Date Value Ref Range Status   09/10/2022 2.5 (H) 0.2 - 2.0 mmol/L Final   09/10/2022 2.2 (H) 0.2 - 2.0 mmol/L Final   09/10/2022 2.3 (H) 0.2 - 2.0 mmol/L Final   09/08/2022 2.0 0.2 - 2.0 mmol/L Final       Tacrolimus Levels         ABGs        Microbiology  Microbiology Results (last 15 days)       Procedure Component Value Units Date/Time    COVID-19 (SARS-CoV-2) and Influenza A/B & RSV (Cepheid)- Age less than 5 years [[id number]] Collected: 09/04/22 1225    Order Status: Completed Specimen: Nasopharyngeal Swab Updated: 09/04/22 1325     Purpose of COVID testing Diagnostic -PUI     SARS-CoV-2 Specimen Source Nasal Swab     SARS CoV 2 Overall Result Negative     Comment: Test performed using the Cepheid Xpert Xpress SARS-CoV-2 assay.  This assay is only for use under the Food and Drug  Administration's Emergency Use Authorization. This is a  real-time RT-PCR assay for the qualitative detection of  SARS-CoV-2 (COVID-19) RNA. Viral nucleic acids may persist in  vivo, independent of viability. Detection of viral nucleic acid  does not imply the presence of infectious virus, or that virus  nucleic acid is the cause of clinical symptoms.  Test performance has not been established for immunocompromised  patients or patients without signs and symptoms of respiratory  infection. Negative results do not preclude SARS-CoV-2infection  and should not be used as the sole basis for patient  management decisions.  Invalid results may be due to inhibiting  substances in the specimen and recollection should occur.    Please see Fact Sheets for patients and providers located at:  http://www.rice.biz/          Influenza A Negative     Influenza B Negative     Respiratory Syncytial Virus  Negative     Comment: Test performed using the Cepheid assay.  Please see Fact Sheets for patients and providers located at:  http://www.rice.biz/    Test performed using the Cepheid Xpert Xpress SARS-CoV-2/Flu/RSV  assay. This assay is only for use under the Food and Drug  Administration's Emergency Use Authorization.  This is a multiplexed  real-time RT-PCR test intended for  the simultaneous qualitative  detection and differentiation of SARS-CoV-2, influenza A, influenza B,  and respiratory syncytial virus (RSV) viral RNA.  Viral nucleic acids  may persist in vivo, independent of viability. Detection of viral  nucleic acid does not imply the presence of infectious virus, or that  virus nucleic acid is the cause of clinical symptoms. If only one viral  target is positive but coinfection with multiple targets is suspected,  the sample should be re-tested with another FDA cleared, approved, or  authorized test, if coinfection would change clinical management.  Negative results do not preclude SARS-CoV-2, influenza A virus,  influenza B virus and/or RSV infection and should not be used as the  sole basis for treatment or other patient management decisions.  Invalid results may be due to inhibiting substances in the specimen  and recollection should occur.         Narrative:      o Collect and clearly label specimen type:  o PREFERRED-Upper respiratory specimen: One Nasal Swab in  Transport Media.  o Hand deliver to laboratory ASAP  Diagnostic -PUI              Cardiac Studies:  Echocardiogram:   09/07/2022  Summary    * Markedly dilated left ventricle with severely reduced systolic function  (LVEDVi 109 mL/m2, ejection fraction 21%).    * Hypertrabeculated left ventricle with severe global hypokinesis.    * Mild aortic valve and moderate mitral valve regurgitation (details below).    * Wire in right-sided chambers with mild to moderate (2+) peri-device  tricuspid valve regurgitation.    * Dilated,  poorly responsive IVC.    * When visually compared with the prior study dated 05/29/2021, LV function  is less vigorous (previous EF 31%, now 21%; previous LVOT VTI 17 cm, now 11  cm); valvular regurgitation is less prominent.       Right Heart Catheterization: RA 7. RV 39/4. PA 41/21/29. PCW 14/18/14. Sats: RA 59%, PA 62%, AO 95%, Room Air. Fick CO/CI 4.0/2.0, TD CO/CI 3.5/1.7. HR 80, BP (cuff) 115/72/89.      Imaging:  Radiology Results (24 Hour)       Procedure Component Value Units Date/Time    [place] Noninvas Low Extrem Art Dopp/Press/Wavefrms (PVR) Comp 3-4 Levels [[id number]] Collected: 09/10/22 1029    Order Status: Completed Updated: 09/10/22 1032    Narrative:      CLINICAL HISTORY: Arteriovascular evaluation for VAD    COMPARISON: none    TECHNIQUE/FINDINGS: Noninvasive arterial studies of the lower extremities  were performed using segmental limb pressures, volume plethysmography, and  continuous wave Doppler  analysis.   Doppler segmental pressures from the  proximal thighs to the ankles are normal without any abnormal pressure  gradients.  Pulse volume recordings are normal from the proximal thighs to  the metatarsal level.  Doppler waveform configurations are satisfactory at  the common femorals, popliteals, posterior tibials, and dorsalis pedis  bilaterally.   The resting ankle-brachial indices are 1.14 right and 1.13  left.      Impression:        Normal resting arterial study of the lower extremities.    Darci Current, MD  09/10/2022 10:30 AM    US Carotid Duplex Dopp Comp Bilateral [[id number]] Collected: 09/10/22 1022    Order Status: Completed Updated: 09/10/22 1025    Narrative:      HISTORY: Heart transplant evaluation.    COMPARISON: none    TECHNIQUE: The extracranial carotid arteries  and proximal vertebral  arteries were evaluated with high resolution gray scale imaging, color  Doppler, and spectral waveform analysis. Degree of stenosis is derived from  velocity criteria which are extrapolated from  diameter data as defined by  the Society of Radiologists in Ultrasound Consensus Conference.     FINDINGS:    RIGHT CAROTID:  Imaging demonstrates mild irregular heterogenous plaque in the carotid bulb  and bifurcation causing less than 50% stenosis.   Doppler is  normal.     LEFT CAROTID:  Imaging demonstrates mild irregular heterogenous plaque in the carotid bulb  and bifurcation causing less than 50% stenosis.    Doppler is normal.      PROXIMAL VERTEBRAL ARTERIES: Patent.  Antegrade flow.    MEASURED VELOCITIES (CM/S):    Right CCA:                 60  Right ICA (peak systolic):  39  Right ICA (end diastolic):   16  Right ECA:                 53    Left CCA:                 47  Left ICA (peak systolic):  38  Left ICA (end diastolic):   17  Left ECA:                 24        Impression:        1. Less than 50% stenosis right internal carotid artery.  2. Less than 50% stenosis left internal carotid artery.       Darci Current, MD  09/10/2022 10:23 AM    US Abdomen Complete [[id number]] Collected: 09/10/22 1020    Order Status: Completed Updated: 09/10/22 1025    Narrative:      HISTORY: VAD evaluation    COMPARISON: CT chest abdomen pelvis 09/08/2022    FINDINGS:    Gallbladder: Normal. No gallstones. Negative sonographic Murphy's sign  reported.  Bile ducts: Nondilated. Common bile duct measures 0.4 cm.  Liver: Normal echotexture. No focal mass.  Spleen: Normal.  Pancreas: Visualized portions normal.  Aorta and IVC: Normal caliber.  Kidneys: Right kidney measures 15.4 cm. Left kidney measures 14.8 cm. There  is minimal fullness of the left renal collecting system. 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      Impression:         1. Multicystic kidneys bilaterally.  2. Minimal fullness of the left renal collecting system.  3. Otherwise unremarkable abdominal ultrasound.    Janina Mayo, MD  09/10/2022 10:23 AM

## 2022-09-10 NOTE — Plan of Care (Signed)
Shift Note:      Orientation: AOx 4  Rhythm on tele: SR  Oxygen: RA  Ambulation: SBA   Pain: Denies this shift  GI/GU: cont x2, LBM 11/22  Fall Score: Mod    Critical Labs/Imaging/Procedures:   13 beats of Vtach at start of shift    Comments:    - see doc flow for complete assessment and vitals.     Significant Shift Events and Questions for Attending:    Plan:   - Vitals Q4H   - Hold Eliquis  - Poss d/c 11/24 pending placement      Braden Score: 18

## 2022-09-10 NOTE — Progress Notes (Signed)
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  Patient had issues with low systolic blood pressures which limited the use of guidance directed medical therapy but did tolerate diuretics with weight loss.  It was unclear if patient still needed diuresis and was sent to [facility] for right heart cath to evaluate fluid status and restratification.  Patient is not a heart transplant candidate but can be evaluated for LVAD.     Plan:   1.  Acute on chronic systolic heart failure, most recent ejection fraction 21% with negative cardiac catheterization in the past, status post ICD whose course has been complicated by significant hypotension limiting the use of goal-directed medical therapy.  Patient was transferring from [facility] to [facility] for right heart catheterization to evaluate fluid fluid status, risk ratification, and possibility for candidate for LVAD.    2.  CKD stage IIIb    3.  Stage IV CLL on CALQUENCE( acalabrutinib) [place] [facility], patient seen by Dr. Laurine Blazer on 11/22.  CT chest abdomen pelvis shows significant decrease in lymphadenopathy showing that the chemotherapy is working.  Dr. Cam Hai recommends continuing this medication.  Furthermore there is mention of interaction between Calquence and blood thinners however patient has had no significant  issues with bleeding despite being on aspirin Eliquis and Calquence.  Of note interestingly CT chest does not show any pulmonary edema or pleural effusions.    4.  Insulin-dependent diabetes on Jardiance.  Patient on 50 units of Lantus at home. 10 units Lantus at night with 6 units 3 times daily AC     5.  History of right lower extremity DVT May 2023 was on Eliquis.  Patient had a more recent Doppler of lower extremities bilateral on July 14 of this year showing no DVTs.  Eliquis has been held prior to transfer for right heart catheterization.  Likely can be restarted, patient has any concern about bleeding in the past may also consider discontinue this medication that has been 6 months since starting treatment and most recent Dopplers have been negative.    6.  COPD-continue bronchodilators    7. DVT - prophylaxis - eliquis on hold, consider what plan may be for further interventions can decide if SC heparin vs restarting eliquis         Lines:     Patient Lines/Drains/Airways Status       Active PICC Line / CVC Line / PIV Line / Drain / Airway / Intraosseous Line / Epidural Line / ART Line / Line / Wound / Pressure Ulcer / NG/OG Tube       Name Placement date Placement time Site Days    Puncture Site 09/09/22 Brachial Anterior;Distal;Right Arm 09/09/22  1409  -- less than 1                           Subjective  CC: Ischemic cardiomyopathy    Patient seen bedside  No new complaints  No acute issues overnight    Review of Systems:     As per HPI    Physical Exam:     VITAL SIGNS PHYSICAL EXAM   Temp:  [96.4 F (35.8 C)-98.8 F (37.1 C)] 97.5 F (36.4 C)  Heart Rate:  [78-95] 78  Resp Rate:  [16-19] 18  BP: (83-112)/(53-72) 104/71  Blood Glucose:    Telemetry:        Intake/Output Summary (Last 24 hours) at 09/10/2022 1124  Last data filed at 09/10/2022 0257  Gross per 24 hour   Intake --   Output 1200 ml   Net -1200 ml    Physical Exam  General: awake, alert X 3  Cardiovascular: regular rate and rhythm, no  murmurs, rubs or gallops  Lungs: clear to auscultation bilaterally, without wheezing, rhonchi, or rales  Abdomen: soft, non-tender, non-distended; no palpable masses,  normoactive bowel sounds  Extremities: no edema  Other:          Meds:     Medications were reviewed:  Current Facility-Administered Medications   Medication Dose Route Frequency    apixaban  5 mg Oral Q12H [facility]    aspirin EC  81 mg Oral Daily    atorvastatin  40 mg Oral QHS    fluticasone furoate-vilanterol  1 puff Inhalation QAM    hydrALAZINE  10 mg Oral Q8H SCH    insulin glargine  18 Units Subcutaneous QHS    insulin lispro  1-4 Units Subcutaneous QHS    insulin lispro  1-8 Units Subcutaneous TID AC    insulin lispro  6 Units Subcutaneous TID AC    isosorbide dinitrate  10 mg Oral TID - Nitrate Free Interval    metoprolol succinate XL  12.5 mg Oral Daily    acalabrutinib  100 mg Oral Q12H SCH    potassium chloride  40 mEq Oral BID    torsemide  60 mg Oral BID     Current Facility-Administered Medications   Medication Dose Route Frequency Last Rate     Current Facility-Administered Medications   Medication Dose Route    acetaminophen  650 mg Oral    albuterol-ipratropium  3 mL Nebulization    benzocaine-menthol  1 lozenge Buccal    benzonatate  100 mg Oral    artificial tears (REFRESH PLUS)  1 drop Both Eyes    dextrose  15 g of glucose Oral    Or    dextrose  12.5 g Intravenous    Or    dextrose  12.5 g Intravenous    Or    glucagon (rDNA)  1 mg Intramuscular    melatonin  3 mg Oral    naloxone  0.2 mg Intravenous    saline  2 spray Each Nare         Labs:     Labs (last 72 hours):    Recent Labs   Lab 09/10/22  0419 09/09/22  0454   WBC 12.64* 11.99*   Hgb 14.1 14.3   Hematocrit 42.6 41.6   Platelets 164 170       Recent Labs   Lab 09/10/22  0419 09/07/22  1433   PT 16.9* 17.4*   PT INR 1.5* 1.5*   PTT 36 38    Recent Labs   Lab 09/10/22  0419 09/09/22  0454   Sodium 137 135   Potassium 4.2 3.7  Chloride 94* 89*   CO2 27 31*   BUN 59.0* 58.0*    Creatinine 2.2* 2.3*   Calcium 9.8 9.5   Albumin 3.5 3.9   Protein, Total 7.2 7.2   Bilirubin, Total 2.1* 2.2*   Alkaline Phosphatase 83 98   ALT 11 14   AST (SGOT) 15 18   Glucose 154* 196*                   Microbiology, reviewed and are significant for:  Microbiology Results (last 15 days)       Procedure Component Value Units Date/Time    COVID-19 (SARS-CoV-2) and Influenza A/B & RSV (Cepheid)- Age less than 5 years [[id number]] Collected: 09/04/22 1225    Order Status: Completed Specimen: Nasopharyngeal Swab Updated: 09/04/22 1325     Purpose of COVID testing Diagnostic -PUI     SARS-CoV-2 Specimen Source Nasal Swab     SARS CoV 2 Overall Result Negative     Comment: Test performed using the Cepheid Xpert Xpress SARS-CoV-2 assay.  This assay is only for use under the Food and Drug  Administration's Emergency Use Authorization. This is a  real-time RT-PCR assay for the qualitative detection of  SARS-CoV-2 (COVID-19) RNA. Viral nucleic acids may persist in  vivo, independent of viability. Detection of viral nucleic acid  does not imply the presence of infectious virus, or that virus  nucleic acid is the cause of clinical symptoms.  Test performance has not been established for immunocompromised  patients or patients without signs and symptoms of respiratory  infection. Negative results do not preclude SARS-CoV-2infection  and should not be used as the sole basis for patient  management decisions.  Invalid results may be due to inhibiting  substances in the specimen and recollection should occur.    Please see Fact Sheets for patients and providers located at:  http://www.rice.biz/          Influenza A Negative     Influenza B Negative     Respiratory Syncytial Virus Negative     Comment: Test performed using the Cepheid assay.  Please see Fact Sheets for patients and providers located at:  http://www.rice.biz/    Test performed using the Cepheid Xpert Xpress  SARS-CoV-2/Flu/RSV  assay. This assay is only for use under the Food and Drug  Administration's Emergency Use Authorization.  This is a multiplexed  real-time RT-PCR test intended for the simultaneous qualitative  detection and differentiation of SARS-CoV-2, influenza A, influenza B,  and respiratory syncytial virus (RSV) viral RNA.  Viral nucleic acids  may persist in vivo, independent of viability. Detection of viral  nucleic acid does not imply the presence of infectious virus, or that  virus nucleic acid is the cause of clinical symptoms. If only one viral  target is positive but coinfection with multiple targets is suspected,  the sample should be re-tested with another FDA cleared, approved, or  authorized test, if coinfection would change clinical management.  Negative results do not preclude SARS-CoV-2, influenza A virus,  influenza B virus and/or RSV infection and should not be used as the  sole basis for treatment or other patient management decisions.  Invalid results may be due to inhibiting substances in the specimen  and recollection should occur.         Narrative:      o Collect and clearly label specimen type:  o PREFERRED-Upper respiratory specimen: One Nasal Swab in  [employer].  o Hand deliver to laboratory  ASAP  Diagnostic -PUI            Imaging, reviewed and are significant for:         Signed by: Elon Jester, MD

## 2022-09-10 NOTE — Plan of Care (Signed)
Cross Cover Note:    Lactate mildly elevated (2 -> 2.3).  No evidence of sepsis, but will repeat lab to trend it.    Claria Dice. Alonza Bogus, MD

## 2022-09-10 NOTE — Progress Notes (Addendum)
[facility] PROGRESS NOTE    [facility] CTU4  [address]  [place] [address]  Loc: [phone]    Date Time: 09/10/22 9:11 AM  Patient Name: [name] [name] Day: 1    Subjective/Cardiac Relevant Events:   Feeling fine.  No complaints.     Net Negative 1.2L overnight.   Weight this AM 184lbs.  Unknown Dry weight, but given normal PCWP on RHC suspect he is at his dry weight.     ASSESSMENT:   Patient is a 76 y.o. male with the following relevant diagnoses:    Transferred from [facility] 22 Nov 23 for ADV HF evaluation  Acute on chronic HFrEF - NYHA IIIB/IV  On Torsemide 60mg  PO BID.   NT-proBNP this AM 2,051.  S/P RHC 22 Nov 23 with Dr. Idolina Primer - normal PCWP.    Hemodynamics: RA 7. RV 39/4. PA 41/21/29. PCW 14/18/14. Sats: RA 59%, PA 62%, AO 95%, Room Air. Fick CO/CI 4.0/2.0, TD CO/CI 3.5/1.7. HR 80, BP (cuff) 115/72/89.   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  Echo 21 Nov 23 - EF 21% (simpson's), marked LVE with LVIDD of 7cm, hypertrabeculated LV with severe global hypokinesis, grade II DD, mild AI, LAE with LAVI of 41.1mL/m2, mod MR, mild-mod TR, RVSP .   S/P initial Primary prevention initial St Jude single chamber ICD implant in 2012 with RV ICD lead Fx, now S/P abandonment of RV ICD lead and implant of Medtronic BiVentricular ICD 23 Jan 18 with Dr. Trish Mage.  HIS CRT-D System is NOT MRI Conditional.  Review of last remote interrogation from October 2023, with NSVT events, but no susatained arrhythmias.  BiVp 94.3% with effective CRT of 93.1%.  EKG 23 Nov 23 demonstrates optimal CRT paced waveform. Optivol Over limit since mid Aug 2023.  HTN  HLD  IDDM II   Acute RLE DVT   CLL - Rai stage IV - followed by [place] [facility]  CKD III - follows with Dr. Renaee Munda.  BMI 25.01 today     RECOMMENDATIONS:     Continue current regimen, may be reasonable to reduce diuretics, but defer to ADV HF  team.   LVAD workup ongoing.  Continue to monitor via telemetry   Consider nephrology involvement   Continue supportive care.    Heart Cardiology team will formally re-see Saturday barring any issues overnight, but please contact us at numbers below/secure chat if we need to re-engage sooner.      Attestation:  I have seen the patient and performed the substantive portion of the visit with my personal exam as below.    I have reviewed and updated the documented findings and plan of care accordingly.     Physical Exam  Constitutional: Cooperative, alert, no acute distress.  Neck: No carotid bruits, JVP normal.  Cardiac: Regular rate and rhythm, normal S1 and S2; no S3 or S4, no murmur, no rubs, no gallops.  Pulmonary: Clear to auscultation bilaterally, no wheezing, no rhonchi, no rales.  Extremities: no edema.  Vascular: +2 pulses in radial artery bilaterally, 2+ pedal pulses bilaterally.    Euvolemic and compensated.  Discharge planning?    Garth Bigness, MD        Telemetry/Labs Reviewed/Intake-Output:     Telemetry reviewed: SR with occ PVC's and 8bt MM NSVT event overnight and HR 80-90's     Recent Labs  Lab 09/04/22  1628 09/04/22  1225   hs Troponin-I 58.9* 66.2*   hs Troponin-I Delta -7.3  --      Recent Labs   Lab 09/10/22  0419   Bilirubin, Total 2.1*   Bilirubin Direct 0.7*   Protein, Total 7.2   Albumin 3.5   ALT 11   AST (SGOT) 15     Recent Labs   Lab 09/10/22  0419   Magnesium 2.6     Recent Labs   Lab 09/10/22  0419   PT 16.9*   PT INR 1.5*   PTT 36     Recent Labs   Lab 09/10/22  0419 09/09/22  0454 09/05/22  0758   WBC 12.64* 11.99* 14.86*   Hgb 14.1 14.3 12.2*   Hematocrit 42.6 41.6 38.5   Platelets 164 170 118*     Recent Labs   Lab 09/10/22  0419 09/09/22  0454 09/08/22  0743   Sodium 137 135 140   Potassium 4.2 3.7 3.4*   Chloride 94* 89* 92*   CO2 27 31* 36*   BUN 59.0* 58.0* 55.0*   Creatinine 2.2* 2.3* 2.1*   eGFR 30.3* 28.8* 32.1*   Glucose 154* 196* 55*   Calcium 9.8 9.5 9.8     Lab  Results   Component Value Date    BNP 2,051 (H) 09/10/2022      Intake/Output Summary (Last 24 hours) at 09/10/2022 0911  Last data filed at 09/10/2022 0257  Gross per 24 hour   Intake --   Output 1200 ml   Net -1200 ml     Medications:      Scheduled Meds: PRN Meds:    apixaban, 5 mg, Oral, Q12H [facility]  aspirin EC, 81 mg, Oral, Daily  atorvastatin, 40 mg, Oral, QHS  fluticasone furoate-vilanterol, 1 puff, Inhalation, QAM  hydrALAZINE, 10 mg, Oral, Q8H SCH  insulin glargine, 18 Units, Subcutaneous, QHS  insulin lispro, 1-4 Units, Subcutaneous, QHS  insulin lispro, 1-8 Units, Subcutaneous, TID AC  insulin lispro, 6 Units, Subcutaneous, TID AC  isosorbide dinitrate, 10 mg, Oral, TID - Nitrate Free Interval  metoprolol succinate XL, 12.5 mg, Oral, Daily  acalabrutinib, 100 mg, Oral, Q12H SCH  potassium chloride, 40 mEq, Oral, BID  torsemide, 60 mg, Oral, BID        Continuous Infusions:   acetaminophen, 650 mg, Q6H PRN  albuterol-ipratropium, 3 mL, Q6H PRN  benzocaine-menthol, 1 lozenge, Q2H PRN  benzonatate, 100 mg, TID PRN  artificial tears (REFRESH PLUS), 1 drop, TID PRN  dextrose, 15 g of glucose, PRN   Or  dextrose, 12.5 g, PRN   Or  dextrose, 12.5 g, PRN   Or  glucagon (rDNA), 1 mg, PRN  melatonin, 3 mg, QHS PRN  naloxone, 0.2 mg, PRN  saline, 2 spray, Q4H PRN          Physical Exam:     Vitals:    09/10/22 0257 09/10/22 0402 09/10/22 0700 09/10/22 0720   BP:  108/55  104/67   Pulse: 90 80 81 78   Resp:  18  18   Temp:  98.7 F (37.1 C)  97.5 F (36.4 C)   TempSrc:  Oral  Oral   SpO2:  94%  95%   Weight:  83.6 kg (184 lb 6.4 oz)     Height:             09/05/2022     6:08 AM 09/06/2022  4:04 AM 09/07/2022     4:10 AM 09/08/2022     3:51 AM 09/09/2022     3:12 AM 09/09/2022     1:00 PM 09/10/2022     4:02 AM   Weight Monitoring   Height      182.9 cm    Height Method      Stated    Weight 94.53 kg 91.445 kg 88.179 kg 85.095 kg 84.097 kg 83.87 kg 83.643 kg   Weight Method Standing Scale Standing Scale Standing  Scale Standing Scale Standing Scale Standing Scale Standing Scale   BMI (calculated)      25.1 kg/m2      Constitutional: Cooperative, alert, no acute distress.  Neck: No carotid bruits, JVP normal.  Cardiac: Regular rate and rhythm, normal S1 and S2; no S3 or S4, no murmurs, no rubs, no gallops.  Pulmonary: CTA B/L A&P.    Extremities: no edema.  Vascular: +2 pulses in radial artery bilaterally, 2+ pedal pulses bilaterally.    -------------------------------------------------------------------------------------  Signed by:         Karis Juba PhD, DrPH, DMSc, PA-C   Cardiology         [name]    [facility] Heart Contact Information   [name]  Secure Chat (Group):   [facility] [place] [facility]    APP Spectralink:  [phone]    MD Spectralink :  [phone]  [phone]    After hours, non urgent consult line:  [phone]    After hours, physician on-call:  [phone] [facility]  Secure Chat (Group):   [facility] [place] [facility]    APP Spectralink:  [phone]    MD Spectralink :  [phone]      After hours, non urgent consult line:  [phone]    After hours, physician on-call:  [phone] [facility]  Secure Chat (Group):   [facility]    APP Spectralink:  [phone]    MD Spectralink :  [phone]      After hours, non urgent consult line:  [phone]    After hours, physician on-call:  [phone] [name]  Secure Chat (Group):   [facility]    APP Spectralink:  [phone]    MD Spectralink :  [phone]      After hours, non urgent consult line:  [phone]    After hours, physician on-call:  [phone]       This note was generated by the Dragon speech recognition and may contain errors or omissions not intended by the user. Grammatical errors, random word insertions, deletions, pronoun errors, and incomplete sentences are occasional consequences of this technology due to software limitations. Not all errors are caught or corrected. If there are questions or concerns  about the content of this note or information contained within the body of this dictation, they should be addressed directly with the author for clarification.

## 2022-09-10 NOTE — Plan of Care (Addendum)
Shift Comment:  -Abd [place] &US Doppler BLE completed, see results  -Lactic acid >2 throughout shift, Attending notified, trending lactic acid per Attending order  -Urine samples sent to lab  -Attending, Jacqlyn Larsen MD, notified of pt BP 84/51 after receiving Isordil ~ an hour prior. Pt asymptomatic. 1700 Hydralazine not given.   -x13 beats of VT at shift change. Pt asymptomatic. VSS. Hospitalist Madison Winfrey aware.    Plan:  -LVAD workup  -Trending lactic acid Q12 hr per Jacqlyn Larsen MD  -Monitor BP w/ new med regimen    Problem: Moderate/High Fall Risk Score >5  Goal: Patient will remain free of falls  Outcome: Progressing  Flowsheets (Taken 09/10/2022 0900)  Moderate Risk (6-13):   MOD-Consider activation of bed alarm if appropriate   MOD-Apply bed exit alarm if patient is confused   MOD-Floor mat at bedside (where available) if appropriate   LOW-Anticoagulation education for injury risk   LOW-Fall Interventions Appropriate for Low Fall Risk   MOD-Perform dangle, stand, walk (DSW) prior to mobilization     Problem: Hemodynamic Status: Cardiac  Goal: Stable vital signs and fluid balance  Outcome: Progressing  Flowsheets (Taken 09/09/2022 1540)  Stable vital signs and fluid balance:   Assess signs and symptoms associated with cardiac rhythm changes   Monitor lab values     Problem: Everyday - Heart Failure  Goal: Stable Vital Signs and Fluid Balance  Outcome: Progressing  Goal: Mobility/Activity is Maintained at Optimal Level for Patient  Outcome: Progressing  Goal: Nutritional Intake is Adequate  Outcome: Progressing

## 2022-09-10 NOTE — Progress Notes (Signed)
Cross cover note:   Notified by RN re: "8 beats of vtach around 01:00, pt was asymptomatic"   - BMP pending. Added Mg. Follow up on K and mg. Replete as needed.

## 2022-09-10 NOTE — UM Notes (Signed)
Initial Inpatient Review   09/09/22 1325  ADMIT TO INPATIENT  Once        Diagnosis: Ischemic Cardiomyopathy   Level of Care: Step Down/ Prog Care [facility], [facility], [facility] ONLY   Patient Class: Inpatient         Admitting Physician Park Meo A    Service: Medicine    Estimated Length of Stay 3 - 5 Days    Tentative Discharge Plan? Home or Self Care    Does patient need telemetry? Yes    Is patient 18 yrs or greater? 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  Patient had issues with low systolic blood pressures which limited the use of guidance directed medical therapy but did tolerate diuretics with weight loss.  It was unclear if patient still needed diuresis and was sent to [facility] for right heart cath to evaluate fluid status and restratification.  Patient is not a heart transplant candidate but can be evaluated for LVAD.    Admit to Cardiac Tele     Assessment and Plan  .  Acute on chronic systolic heart failure, most recent ejection fraction 21% with negative cardiac catheterization in the past, status post ICD whose course has been complicated by significant hypotension limiting the use of goal-directed medical therapy.  Patient was transferring from [facility] to [facility] for right heart catheterization to evaluate fluid fluid status, risk ratification, and possibility for  candidate for LVAD.  2.  CKD stage IIIb  3.  Stage IV CLL on CALQUENCE( acalabrutinib) [place] cancer specialist, patient seen by Dr. Laurine Blazer on 11/22.  CT chest abdomen pelvis shows significant decrease in lymphadenopathy showing that the chemotherapy is working.  Dr. Cam Hai recommends continuing this medication.  Furthermore there is mention of interaction between Calquence and blood thinners however patient has had no significant issues with bleeding despite being on aspirin Eliquis and Calquence.  Of note interestingly CT chest does not show any pulmonary edema or pleural effusions.  4.  Insulin-dependent diabetes on Jardiance.  Patient on 50 units of Lantus at home.  This was decreased to 40 units at night while at [facility].  Blood sugars were relatively well controlled as long as patient was eating however on 11/20 patient was n.p.o. and had hypoglycemia.  Change patient's glucose regimen to 10 units Lantus at night with 6 units 3 times daily AC and this can be increased as needed to control blood sugars but avoid hypoglycemia.  5.  History of right lower extremity DVT May 2023 was on Eliquis.  Patient had a more recent Doppler of lower extremities bilateral on July 14 of this year showing no DVTs.  Eliquis has been held prior to transfer for right heart catheterization.  Likely can be restarted, patient has any concern about bleeding in the past may also  consider discontinue this medication that has been 6 months since starting treatment and most recent Dopplers have been negative.  6.  COPD-continue bronchodilators, no wheezing on examination  7. DVT - prophylaxis - eliquis on hold, consider what plan may be for further interventions can decide if SC heparin vs restarting eliquis   8. FULL code - patient wants to live as long as possible as her reports good quality of life at this time       S/p Right heart cath     09/10/2022  Cross cover note:   Notified by RN re: "8 beats of vtach around 01:00, pt was  asymptomatic"   - BMP pending. Added Mg. Follow up on K and mg. Replete as needed.    Cross Cover Note: @ 5:36 AM      Lactate mildly elevated (2 -> 2.3).  No evidence of sepsis, but will repeat lab to trend it.    Cardiology Following  Continue current regimen, may be reasonable to reduce diuretics, but defer to ADV HF team.   LVAD workup ongoing.  Continue to monitor via telemetry   Consider nephrology involvement   Continue supportive care.   [facility] team will formally re-see Saturday barring any issues overnight, but please contact us at numbers below/secure chat if we need to re-engage sooner.     09/09/22 04:54 09/10/22 04:19   Glucose 196 (H) 154 (H)   BUN 58.0 (H) 59.0 (H)   Creatinine 2.3 (H) 2.2 (H)       Scheduled Meds:  Current Facility-Administered Medications   Medication Dose Route Frequency    apixaban  5 mg Oral Q12H [facility]    aspirin EC  81 mg Oral Daily    atorvastatin  40 mg Oral QHS    fluticasone furoate-vilanterol  1 puff Inhalation QAM    hydrALAZINE  10 mg Oral Q8H SCH    insulin glargine  18 Units Subcutaneous QHS    insulin lispro  1-4 Units Subcutaneous QHS    insulin lispro  1-8 Units Subcutaneous TID AC    insulin lispro  6 Units Subcutaneous TID AC    isosorbide dinitrate  10 mg Oral TID - Nitrate Free Interval    metoprolol succinate XL  12.5 mg Oral Daily    acalabrutinib  100 mg Oral Q12H SCH    potassium chloride  40 mEq Oral BID    torsemide  60 mg Oral BID     Continuous Infusions:  PRN Meds:.acetaminophen, albuterol-ipratropium, benzocaine-menthol, benzonatate, artificial tears (REFRESH PLUS), dextrose **OR** dextrose **OR** dextrose **OR** glucagon (rDNA), melatonin, naloxone, saline     Ashok Pall RN  Utilization Review  [facility]  [address] [employer] [facility] [address]  [address]  [place] [address]  Phone: [phone]  [place]: [phone]  Fax: [phone]  Email: [email or web address]  NPI: [id number]  Tax ID: [id number]   This clinical  review is based on compiled from documentation provided by the treatment team with the patient's medical record

## 2022-09-11 DIAGNOSIS — I5021 Acute systolic (congestive) heart failure: Secondary | ICD-10-CM

## 2022-09-11 DIAGNOSIS — I4729 Other ventricular tachycardia: Secondary | ICD-10-CM

## 2022-09-11 DIAGNOSIS — I10 Essential (primary) hypertension: Secondary | ICD-10-CM

## 2022-09-11 DIAGNOSIS — C9110 Chronic lymphocytic leukemia of B-cell type not having achieved remission: Secondary | ICD-10-CM

## 2022-09-11 DIAGNOSIS — I2510 Atherosclerotic heart disease of native coronary artery without angina pectoris: Secondary | ICD-10-CM

## 2022-09-11 LAB — BASIC METABOLIC PANEL
Anion Gap: 17 — ABNORMAL HIGH (ref 5.0–15.0)
Anion Gap: 14 (ref 5.0–15.0)
BUN: 72 mg/dL — ABNORMAL HIGH (ref 9.0–28.0)
BUN: 75 mg/dL — ABNORMAL HIGH (ref 9.0–28.0)
CO2: 20 mEq/L (ref 17–29)
CO2: 25 mEq/L (ref 17–29)
Calcium: 9.8 mg/dL (ref 7.9–10.2)
Calcium: 9.6 mg/dL (ref 7.9–10.2)
Chloride: 94 mEq/L — ABNORMAL LOW (ref 99–111)
Chloride: 93 mEq/L — ABNORMAL LOW (ref 99–111)
Creatinine: 2.7 mg/dL — ABNORMAL HIGH (ref 0.5–1.5)
Creatinine: 2.6 mg/dL — ABNORMAL HIGH (ref 0.5–1.5)
Glucose: 183 mg/dL — ABNORMAL HIGH (ref 70–100)
Glucose: 317 mg/dL — ABNORMAL HIGH (ref 70–100)
Potassium: 5.7 mEq/L — ABNORMAL HIGH (ref 3.5–5.3)
Potassium: 4.6 mEq/L (ref 3.5–5.3)
Sodium: 131 mEq/L — ABNORMAL LOW (ref 135–145)
Sodium: 132 mEq/L — ABNORMAL LOW (ref 135–145)
eGFR: 23.7 mL/min/{1.73_m2} — AB (ref >=60)
eGFR: 24.8 mL/min/{1.73_m2} — AB (ref >=60)

## 2022-09-11 LAB — GLUCOSE WHOLE BLOOD - POCT
Whole Blood Glucose POCT: 230 mg/dL — ABNORMAL HIGH (ref 70–100)
Whole Blood Glucose POCT: 190 mg/dL — ABNORMAL HIGH (ref 70–100)
Whole Blood Glucose POCT: 249 mg/dL — ABNORMAL HIGH (ref 70–100)
Whole Blood Glucose POCT: 319 mg/dL — ABNORMAL HIGH (ref 70–100)
Whole Blood Glucose POCT: 211 mg/dL — ABNORMAL HIGH (ref 70–100)

## 2022-09-11 LAB — CBC
Absolute NRBC: 0 10*3/uL (ref 0.00–0.00)
Hematocrit: 39.2 % (ref 37.6–49.6)
Hgb: 13.2 g/dL (ref 12.5–17.1)
MCH: 33.2 pg (ref 25.1–33.5)
MCHC: 33.7 g/dL (ref 31.5–35.8)
MCV: 98.5 fL — ABNORMAL HIGH (ref 78.0–96.0)
Nucleated RBC: 0 /100 WBC (ref 0.0–0.0)
Platelets: 198 10*3/uL (ref 142–346)
RBC: 3.98 10*6/uL — ABNORMAL LOW (ref 4.20–5.90)
RDW: 16 % — ABNORMAL HIGH (ref 11–15)
WBC: 12.85 10*3/uL — ABNORMAL HIGH (ref 3.10–9.50)

## 2022-09-11 LAB — HEPATIC FUNCTION PANEL
ALT: 10 U/L (ref 0–55)
AST (SGOT): 21 U/L (ref 5–41)
Albumin/Globulin Ratio: 0.9 (ref 0.9–2.2)
Albumin: 3.5 g/dL (ref 3.5–5.0)
Alkaline Phosphatase: 89 U/L (ref 37–117)
Bilirubin Direct: 0.5 mg/dL (ref 0.0–0.5)
Bilirubin Indirect: 1.2 mg/dL — ABNORMAL HIGH (ref 0.2–1.0)
Bilirubin, Total: 1.7 mg/dL — ABNORMAL HIGH (ref 0.2–1.2)
Globulin: 3.8 g/dL — ABNORMAL HIGH (ref 2.0–3.6)
Protein, Total: 7.3 g/dL (ref 6.0–8.3)

## 2022-09-11 LAB — LACTIC ACID, PLASMA
Lactic Acid: 1.6 mmol/L (ref 0.2–2.0)
Lactic Acid: 1.8 mmol/L (ref 0.2–2.0)

## 2022-09-11 MED ORDER — METOPROLOL SUCCINATE ER 25 MG PO TB24
25.0000 mg | ORAL_TABLET | Freq: Every day | ORAL | Status: DC
Start: 2022-09-12 — End: 2022-09-14
  Administered 2022-09-12 – 2022-09-14 (×3): 25 mg via ORAL
  Filled 2022-09-11 (×3): qty 1

## 2022-09-11 MED ORDER — METOPROLOL SUCCINATE ER 25 MG PO TB24
12.5000 mg | ORAL_TABLET | Freq: Once | ORAL | Status: AC
Start: 2022-09-11 — End: 2022-09-11
  Administered 2022-09-11: 12.5 mg via ORAL
  Filled 2022-09-11: qty 1

## 2022-09-11 NOTE — Progress Notes (Signed)
[facility] PROGRESS NOTE    [facility] CTU4  [address]  [place] [address]  Loc: [phone]    Date Time: 09/11/22 8:59 AM  Patient Name: [name] [name] Day: 2    Subjective/Cardiac Relevant Events:   Sleepy this morning, says he wasn't able to sleep much overnight.  13 beats of VT this morning. Two episodes of NSVT yesterday lasting 12 beats and 7 beats   Creatinine this morning 2.7 and K 5.7    BP 90s-100s.  Net Negative 666 mL overnight.   Weight this AM 187lbs.  Unknown Dry weight, but given normal PCWP on RHC suspect dry weight is near 183-185    ASSESSMENT:   Patient is a 76 y.o. male with the following relevant diagnoses:    Transferred from [facility] 22 Nov 23 for ADV HF evaluation  Acute on chronic HFrEF - NYHA IIIB/IV  On Torsemide 60mg  PO BID.   NT-proBNP this AM 2,051.  S/P RHC 22 Nov 23 with Dr. 24 Nov - normal PCWP.    Hemodynamics: RA 7. RV 39/4. PA 41/21/29. PCW 14/18/14. Sats: RA 59%, PA 62%, AO 95%, Room Air. Fick CO/CI 4.0/2.0, TD CO/CI 3.5/1.7. HR 80, BP (cuff) 115/72/89.   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  CLL - Rai stage IV - [facility]  Moderate mitral and tricuspid valve regurgitation   Echo 21 Nov 23 - EF 21% (simpson's), marked LVE with LVIDD of 7cm, hypertrabeculated LV with severe global hypokinesis, grade II DD, mild AI, LAE with LAVI of 41.24mL/m2, mod MR, mild-mod TR, RVSP 4m.   S/P initial Primary prevention initial St Jude single chamber ICD implant in 2012 with RV ICD lead Fx, now S/P abandonment of RV ICD lead and implant of Medtronic BiVentricular ICD 23 Jan 18 with Dr. 25 Jan.  HIS CRT-D System is NOT MRI Conditional.  Review of last remote interrogation from October 2023, with NSVT events, but no susatained arrhythmias.  BiVp 94.3% with effective CRT of 93.1%.  EKG 23 Nov 23 demonstrates optimal CRT paced waveform.  Optivol Over limit since mid Aug 2023.  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 03/2022.  BMI 25.01 today     RECOMMENDATIONS:     Recommend decreasing torsemide to once daily since patient is reaching euvolumia and creatinine is increasing. However will defer this to advanced heart failure team  Hyperkalemia this morning : torsemide as above. Obtain ECG and repeat K later this afternoon. Consider albuterol though this is only temporizing. If K remains elevated this afternoon consider Patiromer.   Continue aspirin 81 mg, atorvastatin 40 mg.  Continue Isordil 10 mg 3 times daily, hydralazine 10 mg 3 times daily  Increase metoprolol succinate 12.5 mg daily to 25mg  daily given NSVT,   Holding ARNI due to AKI, SGLT2 being held due to low creatinine clearance, MRA being held due to AKI / hyperkalemia.  Remains on Eliquis for right lower extremity DVT diagnosed 02/2022  LVAD workup ongoing.  Consider nephrology involvement   Continue supportive care.   Discussed with advanced heart failure and primary hosptialist team  [facility] team will formally see this  patient again on Monday unless any issues arise     Laela Deviney Oneita KrasA Aarin Sparkman, MD    Telemetry/Labs Reviewed/Intake-Output:     Telemetry reviewed: SR with occ PVC's and 8bt MM NSVT event overnight and HR 80-90's     Recent Labs   Lab 09/04/22  1628 09/04/22  1225   hs Troponin-I 58.9* 66.2*   hs Troponin-I Delta -7.3  --        Recent Labs   Lab 09/11/22  0541   Bilirubin, Total 1.7*   Bilirubin Direct 0.5   Protein, Total 7.3   Albumin 3.5   ALT 10   AST (SGOT) 21       Recent Labs   Lab 09/10/22  0419   Magnesium 2.6       Recent Labs   Lab 09/10/22  0419   PT 16.9*   PT INR 1.5*   PTT 36       Recent Labs   Lab 09/11/22  0326 09/10/22  0419 09/09/22  0454   WBC 12.85* 12.64*  11.99*   Hgb 13.2 14.1 14.3   Hematocrit 39.2 42.6 41.6   Platelets 198 164 170       Recent Labs   Lab 09/10/22  0419 09/09/22  0454 09/08/22  0743   Sodium 137 135 140   Potassium 4.2 3.7 3.4*   Chloride 94* 89* 92*   CO2 27 31* 36*   BUN 59.0* 58.0* 55.0*   Creatinine 2.2* 2.3* 2.1*   eGFR 30.3* 28.8* 32.1*   Glucose 154* 196* 55*   Calcium 9.8 9.5 9.8       Lab Results   Component Value Date    BNP 2,051 (H) 09/10/2022      Intake/Output Summary (Last 24 hours) at 09/11/2022 0859  Last data filed at 09/11/2022 0746  Gross per 24 hour   Intake 2034 ml   Output 2700 ml   Net -666 ml       Medications:      Scheduled Meds: PRN Meds:    apixaban, 5 mg, Oral, [facility]12H [facility]  aspirin EC, 81 mg, Oral, Daily  atorvastatin, 40 mg, Oral, QHS  fluticasone furoate-vilanterol, 1 puff, Inhalation, QAM  hydrALAZINE, 10 mg, Oral, Q8H SCH  insulin glargine, 18 Units, Subcutaneous, QHS  insulin lispro, 1-4 Units, Subcutaneous, QHS  insulin lispro, 1-8 Units, Subcutaneous, TID AC  insulin lispro, 6 Units, Subcutaneous, TID AC  isosorbide dinitrate, 10 mg, Oral, TID - Nitrate Free Interval  metoprolol succinate XL, 12.5 mg, Oral, Daily  acalabrutinib, 100 mg, Oral, Q12H SCH  potassium chloride, 40 mEq, Oral, BID  torsemide, 60 mg, Oral, BID        Continuous Infusions:   acetaminophen, 650 mg, Q6H PRN  albuterol-ipratropium, 3 mL, Q6H PRN  benzocaine-menthol, 1 lozenge, Q2H PRN  benzonatate, 100 mg, TID PRN  artificial tears (REFRESH PLUS), 1 drop, TID PRN  dextrose, 15 g of glucose, PRN   Or  dextrose, 12.5 g, PRN   Or  dextrose, 12.5 g, PRN   Or  glucagon (rDNA), 1 mg, PRN  melatonin, 3 mg, QHS PRN  naloxone, 0.2 mg, PRN  saline, 2 spray, Q4H PRN          Physical Exam:     Vitals:    09/11/22 0053 09/11/22 0353 09/11/22 0548 09/11/22 0843   BP: 101/63 109/64  99/69   Pulse: 90 92  82   Resp:  20  20   Temp:  97.4 F (36.3 C)  98.4 F (36.9 C)   TempSrc:  Oral  Oral   SpO2:  98%  97%   Weight:   85.2 kg (187 lb 14.4 oz)    Height:              09/06/2022     4:04 AM 09/07/2022     4:10 AM 09/08/2022     3:51 AM 09/09/2022     3:12 AM 09/09/2022     1:00 PM 09/10/2022     4:02 AM 09/11/2022     5:48 AM   Weight Monitoring   Height     182.9 cm     Height Method     Stated     Weight 91.445 kg 88.179 kg 85.095 kg 84.097 kg 83.87 kg 83.643 kg 85.231 kg   Weight Method Standing Scale Standing Scale Standing Scale Standing Scale Standing Scale Standing Scale Standing Scale   BMI (calculated)     25.1 kg/m2       Constitutional: Cooperative, alert, no acute distress.  Neck: No carotid bruits, JVP normal.  Cardiac: Regular rate and rhythm, normal S1 and S2; no S3 or S4, no murmurs, no rubs, no gallops.  Pulmonary: CTA B/L A&P.    Extremities: no edema.  Vascular: +2 pulses in radial artery bilaterally, 2+ pedal pulses bilaterally.    -------------------------------------------------------------------------------------  Signed by:         Karis Juba PhD, DrPH, DMSc, PA-C   Cardiology         [facility]    [facility] Contact Information   [facility]  Secure Chat (Group):   [facility] [place] [facility]    APP Spectralink:  [phone]    MD Spectralink :  [phone]  [phone]    After hours, non urgent consult line:  [phone]    After hours, physician on-call:  [phone] [facility]  Secure Chat (Group):   [facility] [place] [facility]    APP Spectralink:  [phone]    MD Spectralink :  [phone]      After hours, non urgent consult line:  [phone]    After hours, physician on-call:  [phone] [facility]  Secure Chat (Group):   [facility]    APP Spectralink:  [phone]    MD Spectralink :  [phone]      After hours, non urgent consult line:  [phone]    After hours, physician on-call:  [phone] [facility]  Secure Chat (Group):   [place] [facility]    APP Spectralink:  [phone]    MD Spectralink :  [phone]      After hours, non urgent consult line:  [phone]    After hours, physician on-call:  [phone]        This note was generated by the Dragon speech recognition and may contain errors or omissions not intended by the user. Grammatical errors, random word insertions, deletions, pronoun errors, and incomplete sentences are occasional consequences of this technology due to software limitations. Not all errors are caught or corrected. If there are questions or concerns about the content of this note or information contained within the body of this dictation, they should be addressed directly with the author for clarification.

## 2022-09-11 NOTE — Plan of Care (Addendum)
Shift Comment:  -Bhagat MD notified of potassium 5.7, redraw ordered for 1600, resulted as 4.6    Plan:  -LVAD workup  -GDMT    Problem: Moderate/High Fall Risk Score >5  Goal: Patient will remain free of falls  Outcome: Progressing  Flowsheets (Taken 09/11/2022 0730)  Moderate Risk (6-13):   MOD-Consider activation of bed alarm if appropriate   MOD-Apply bed exit alarm if patient is confused   MOD-Floor mat at bedside (where available) if appropriate   LOW-Anticoagulation education for injury risk   LOW-Fall Interventions Appropriate for Low Fall Risk   MOD-Perform dangle, stand, walk (DSW) prior to mobilization     Problem: Hemodynamic Status: Cardiac  Goal: Stable vital signs and fluid balance  Outcome: Progressing  Flowsheets (Taken 09/09/2022 1540)  Stable vital signs and fluid balance:   Assess signs and symptoms associated with cardiac rhythm changes   Monitor lab values

## 2022-09-11 NOTE — Progress Notes (Signed)
Advanced Heart Failure and Transplant Progress Note    Epic Chat Group: '[place] [facility] Advanced Heart Failure'    Primary Cardiologist: Marianna Fuss, MD   CV Surgeon: N/A  Referring Physician: Greggory Brandy, MD     Listed for transplant: N/A    Assessment:   76 y.o M with PMH CLL (on Calq) HLD, DM, recent RLE DVT, CKD/AKI, hx CLL, dilated NICM, severe LV dysfunction, admit recurrent acute-on-chronic systolic HF, with Lactate elevation per OSH labs concerning for possible CS, referred for RHC and advanced HF therapy evaluation.     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  If it keeps rising than worried more about a failure of oral therapy.    Recommendations:     Heart Failure Medical Therapy  BB: Metoprolol 12.5mg  daily  ACE/ARB/ARNI: Hold d/t AKI  SGLT2i: Hold Jardiance given low CrCl  MRA: Hold d/t AKI  Diuresis: Torsemide 60mg  BID   Hydralazine / Nitrates  Cardiac Device (ICD/ Cardiomems): Medtronic ICD  Home Regiment: Metoprolol 12.5mg , Torsemide 60mg  BID, Metolazone 10mg  daily, Valsartan 20mg  daily, Jardiance 10mg   NICM, EF 28%  RHC CI: 2.0 by Fick and 1.7 by thermal  Hemodynamics: RA 7. RV 39/4. PA 41/21/29. PCW 14/18/14. Sats: RA 59%, PA 62%, AO 95%, Room Air. Fick CO/CI 4.0/2.0, TD CO/CI 3.5/1.7. HR 80, BP (cuff) 115/72/89.  BP hold parameter to hold for SBP<88  AKI on CKD3a (baseline Cr 2.0, at baseline)  Avoid nephrotoxic medications  Hold ACEi/ARB/ARNI, MRA and Jardiance  If worsening, this limits restarting full GDMT   Lactic acidosis  Lactic acid elevated to 2.5 yesterday, resolved to 1.6 today  Continue to trend, if rising, consider repeat RHC on Monday  T2DM, insulin-dependent  RLE DVT  (02/2022)  Continue Eliquis    Signed by: Lyndal Pulley, MD     I have seen and examined the patient and agree with the assessment and plan as outlined above.     NSVT on telemetry  Tolerating medications but BP is lower    VSS  Gen - NAD  Lungs - CTA    Impr:  NICM  CKD 3  Lactate elevated\    Plan  Continue oral HF medications. No change today  Ambulate and PT  LVAD evaluation underway.    Royce Macadamia, MD  Heart Failure / MCS / Heart Transplant        Subjective/24 hour events:   No acute events overnight. Felt slightly dizzy yesterday but no complaints today.     Objective:   Vital Signs in last 24 hours:  Temp:  [97.4 F (36.3 C)-98.4 F (36.9 C)] 98.4 F (36.9 C)  Heart Rate:  [80-100] 82  Resp Rate:  [18-21] 20  BP: (84-111)/(51-71) 99/69  SpO2: 97 %  O2 Device: None (Room air)      Telemetry: NSR  Report: NSVT this morning and last night ~7pm    Hemodynamics:    Intake / Output:    Intake/Output Summary (Last 24 hours) at 09/11/2022 1005  Last data filed at 09/11/2022 0926  Gross per 24 hour   Intake 1798 ml   Output 3200 ml   Net -1402  ml          Weight:      09/09/2022     1:00 PM 09/10/2022     4:02 AM 09/11/2022     5:48 AM   Weight Monitoring   Height 182.9 cm     Height Method Stated     Weight 83.87 kg 83.643 kg 85.231 kg   Weight Method Standing Scale Standing Scale Standing Scale   BMI (calculated) 25.1 kg/m2          Weight change: 1.361 kg (3 lb)       Physical Exam:   General: well-developed, alert, NAD  HEENT: conjunctiva pale, buccal mucosa moist, anicteric sclera, no erythema, JVP ~ 7 cm H2O  Lungs: CTA B/L, no rhonchi, wheezes or crackles with normal rate and effort  Cardiovascular: S1, S2, normal rate, regular rhythm, no m/r/g   Abdominal: soft, NT/ND, +B.S., no HSM  Extremities: warm to touch, no cyanosis, no edema, peripheral pulses present bilaterally  Neuromuscular exam: alert and oriented x 3, speech fluent   Skin: warm     LDAs:  Patient Lines/Drains/Airways Status       Active  Lines, Drains and Airways       None                      Current Meds:  apixaban, 5 mg, Oral, Q12H SCH  aspirin EC, 81 mg, Oral, Daily  atorvastatin, 40 mg, Oral, QHS  fluticasone furoate-vilanterol, 1 puff, Inhalation, QAM  hydrALAZINE, 10 mg, Oral, Q8H SCH  insulin glargine, 18 Units, Subcutaneous, QHS  insulin lispro, 1-4 Units, Subcutaneous, QHS  insulin lispro, 1-8 Units, Subcutaneous, TID AC  insulin lispro, 6 Units, Subcutaneous, TID AC  isosorbide dinitrate, 10 mg, Oral, TID - Nitrate Free Interval  metoprolol succinate XL, 12.5 mg, Oral, Once  [START ON 09/12/2022] metoprolol succinate XL, 25 mg, Oral, Daily  acalabrutinib, 100 mg, Oral, Q12H SCH  potassium chloride, 40 mEq, Oral, BID  torsemide, 60 mg, Oral, BID         Drips:        Labs:  CBC        09/11/22  0326 09/10/22  0419 09/09/22  0454   WBC 12.85* 12.64* 11.99*   Hgb 13.2 14.1 14.3   Hematocrit 39.2 42.6 41.6   Platelets 198 164 170       BMP        09/11/22  0541 09/11/22  0326 09/10/22  1400 09/10/22  0724 09/10/22  0419 09/09/22  0454 09/08/22  1718 09/08/22  0743 09/07/22  1433 09/07/22  0657   BUN 72.0*  --   --   --  59.0* 58.0*  --  55.0*  --  53.0*   Creatinine 2.7*  --   --   --  2.2* 2.3*  --  2.1*  --  1.9*   eGFR 23.7*  --   --   --  30.3* 28.8*  --  32.1*  --  36.2*   Sodium 131*  --   --   --  137 135  --  140  --  139   Potassium 5.7*  --   --   --  4.2 3.7  --  3.4*  --  4.3   Chloride 94*  --   --   --  94* 89*  --  92*  --  98*   CO2 20  --   --   --  27 31*  --  36*  --  29   Calcium 9.8  --   --   --  9.8 9.5  --  9.8  --  9.4   Magnesium  --   --   --   --  2.6  --   --  2.3  --  2.2   Lactic Acid  --  1.6 2.5* 2.2* 2.3*  --    < >  --   --   --    NT-proBNP  --   --   --   --  2,051*  --   --   --   --   --    LDH  --   --   --   --  327  --   --   --  190  --     < > = values in this interval not displayed.       LFTs        09/11/22  0541 09/10/22  0419 09/09/22  0454   AST (SGOT) 21 15 18    ALT 10 11 14    Alkaline  Phosphatase 89 83 98   Bilirubin, Total 1.7* 2.1* 2.2*   Bilirubin Direct 0.5 0.7* 0.7*   Bilirubin Indirect 1.2* 1.4* 1.5*       Coags        09/10/22  0419 09/07/22  1433   PT 16.9* 17.4*   PT INR 1.5* 1.5*   PTT 36 38       ABGs          Microbiology  Microbiology Results (last 15 days)       Procedure Component Value Units Date/Time    COVID-19 (SARS-CoV-2) and Influenza A/B & RSV (Cepheid)- Age less than 5 years 09/12/22 Collected: 09/04/22 1225    Order Status: Completed Specimen: Nasopharyngeal Swab Updated: 09/04/22 1325     Purpose of COVID testing Diagnostic -PUI     SARS-CoV-2 Specimen Source Nasal Swab     SARS CoV 2 Overall Result Negative     Comment: Test performed using the Cepheid Xpert Xpress SARS-CoV-2 assay.  This assay is only for use under the Food and Drug  Administration's Emergency Use Authorization. This is a  real-time RT-PCR assay for the qualitative detection of  SARS-CoV-2 (COVID-19) RNA. Viral nucleic acids may persist in  vivo, independent of viability. Detection of viral nucleic acid  does not imply the presence of infectious virus, or that virus  nucleic acid is the cause of clinical symptoms.  Test performance has not been established for immunocompromised  patients or patients without signs and symptoms of respiratory  infection. Negative results do not preclude SARS-CoV-2infection  and should not be used as the sole basis for patient  management decisions.  Invalid results may be due to inhibiting  substances in the specimen and recollection should occur.    Please see Fact Sheets for patients and providers located at:  09/06/22          Influenza A Negative     Influenza B Negative     Respiratory Syncytial Virus Negative     Comment: Test performed using the Cepheid assay.  Please see Fact Sheets for patients and providers located at:  09/06/22    Test performed using the Cepheid Xpert Xpress  SARS-CoV-2/Flu/RSV  assay. This assay is only for use under the Food and Drug  Administration's Emergency Use Authorization.  This is a multiplexed  real-time RT-PCR test intended for the simultaneous qualitative  detection and differentiation of SARS-CoV-2, influenza A, influenza B,  and respiratory syncytial virus (RSV) viral RNA.  Viral nucleic acids  may persist in vivo, independent of viability. Detection of viral  nucleic acid does not imply the presence of infectious virus, or that  virus nucleic acid is the cause of clinical symptoms. If only one viral  target is positive but coinfection with multiple targets is suspected,  the sample should be re-tested with another FDA cleared, approved, or  authorized test, if coinfection would change clinical management.  Negative results do not preclude SARS-CoV-2, influenza A virus,  influenza B virus and/or RSV infection and should not be used as the  sole basis for treatment or other patient management decisions.  Invalid results may be due to inhibiting substances in the specimen  and recollection should occur.         Narrative:      o Collect and clearly label specimen type:  o PREFERRED-Upper respiratory specimen: One Nasal Swab in  Transport Media.  o Hand deliver to laboratory ASAP  Diagnostic -PUI              Cardiac Studies:  Echocardiogram:   09/07/2022  Summary    * Markedly dilated left ventricle with severely reduced systolic function  (LVEDVi 109 mL/m2, ejection fraction 21%).    * Hypertrabeculated left ventricle with severe global hypokinesis.    * Mild aortic valve and moderate mitral valve regurgitation (details below).    * Wire in right-sided chambers with mild to moderate (2+) peri-device  tricuspid valve regurgitation.    * Dilated, poorly responsive IVC.    * When visually compared with the prior study dated 05/29/2021, LV function  is less vigorous (previous EF 31%, now 21%; previous LVOT VTI 17 cm, now 11  cm); valvular regurgitation is less  prominent.    Right Heart Catheterization:  RA 7. RV 39/4. PA 41/21/29. PCW 14/18/14. Sats: RA 59%, PA 62%, AO 95%, Room Air. Fick CO/CI 4.0/2.0, TD CO/CI 3.5/1.7. HR 80, BP (cuff) 115/72/89.     Imaging:  Radiology Results (24 Hour)       Procedure Component Value Units Date/Time    [place] Noninvas Low Extrem Art Dopp/Press/Wavefrms (PVR) Comp 3-4 Levels [[id number]] Collected: 09/10/22 1029    Order Status: Completed Updated: 09/10/22 1032    Narrative:      CLINICAL HISTORY: Arteriovascular evaluation for VAD    COMPARISON: none    TECHNIQUE/FINDINGS: Noninvasive arterial studies of the lower extremities  were performed using segmental limb pressures, volume plethysmography, and  continuous wave Doppler  analysis.   Doppler segmental pressures from the  proximal thighs to the ankles are normal without any abnormal pressure  gradients.  Pulse volume recordings are normal from the proximal thighs to  the metatarsal level.  Doppler waveform configurations are satisfactory at  the common femorals, popliteals, posterior tibials, and dorsalis pedis  bilaterally.   The resting ankle-brachial indices are 1.14 right and 1.13  left.      Impression:        Normal resting arterial study of the lower extremities.    Darci Current, MD  09/10/2022 10:30 AM    US Carotid Duplex Dopp Comp Bilateral [[id number]] Collected: 09/10/22 1022    Order Status: Completed Updated: 09/10/22 1025    Narrative:      HISTORY: Heart transplant evaluation.    COMPARISON: none    TECHNIQUE: The extracranial  carotid arteries and proximal vertebral  arteries were evaluated with high resolution gray scale imaging, color  Doppler, and spectral waveform analysis. Degree of stenosis is derived from  velocity criteria which are extrapolated from diameter data as defined by  the Society of Radiologists in Ultrasound Consensus Conference.     FINDINGS:    RIGHT CAROTID:  Imaging demonstrates mild irregular heterogenous plaque in the carotid bulb  and bifurcation  causing less than 50% stenosis.   Doppler is  normal.     LEFT CAROTID:  Imaging demonstrates mild irregular heterogenous plaque in the carotid bulb  and bifurcation causing less than 50% stenosis.    Doppler is normal.      PROXIMAL VERTEBRAL ARTERIES: Patent.  Antegrade flow.    MEASURED VELOCITIES (CM/S):    Right CCA:                 60  Right ICA (peak systolic):  39  Right ICA (end diastolic):   16  Right ECA:                 53    Left CCA:                 47  Left ICA (peak systolic):  38  Left ICA (end diastolic):   17  Left ECA:                 24        Impression:        1. Less than 50% stenosis right internal carotid artery.  2. Less than 50% stenosis left internal carotid artery.       Darci Current, MD  09/10/2022 10:23 AM    US Abdomen Complete [[id number]] Collected: 09/10/22 1020    Order Status: Completed Updated: 09/10/22 1025    Narrative:      HISTORY: VAD evaluation    COMPARISON: CT chest abdomen pelvis 09/08/2022    FINDINGS:    Gallbladder: Normal. No gallstones. Negative sonographic Murphy's sign  reported.  Bile ducts: Nondilated. Common bile duct measures 0.4 cm.  Liver: Normal echotexture. No focal mass.  Spleen: Normal.  Pancreas: Visualized portions normal.  Aorta and IVC: Normal caliber.  Kidneys: Right kidney measures 15.4 cm. Left kidney measures 14.8 cm. There  is minimal fullness of the left renal collecting system. 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      Impression:         1. Multicystic kidneys bilaterally.  2. Minimal fullness of the left renal collecting system.  3. Otherwise unremarkable abdominal ultrasound.    Janina Mayo, MD  09/10/2022 10:23 AM

## 2022-09-11 NOTE — Progress Notes (Signed)
MEDICINE PROGRESS NOTE    Date Time: 09/11/22 2:54 PM  Patient Name: [name] [name]  Attending Physician: Elon Jester, MD    Assessment:   76 year old male past medical history of chronic systolic heart failure ejection fraction 21% down from the 31%, negative cardiac catheterization 2014, that is post Medtronic ICD, CKD 3B, insulin-dependent diabetes, stage IV CLL on CALQUENCE( acalabrutinib)  by [place] cancer specialist, COPD, history of right proximal DVT (02/2022) on Eliquis presented to [facility] on day 11/17 with shortness of breath, dyspnea on exertion, orthopnea, lower extremity edema, syncope while driving, and 8 pound weight gain in 2 weeks.  Patient had issues with low systolic blood pressures which limited the use of guidance directed medical therapy but did tolerate diuretics with weight loss.  It was unclear if patient still needed diuresis and was sent to [facility] for right heart cath to evaluate fluid status and restratification.  Patient is not a heart transplant candidate but can be evaluated for LVAD.     Plan:   1.  Acute on chronic systolic heart failure, most recent ejection fraction 21% with negative cardiac catheterization in the past, status post ICD whose course has been complicated by significant hypotension limiting the use of goal-directed medical therapy.   LVAD eval    2.  CKD stage IIIb    3.  Stage IV CLL on CALQUENCE( acalabrutinib) [place] cancer specialist, patient seen by Dr. Laurine Blazer on 11/22.    CT chest abdomen pelvis shows significant decrease in lymphadenopathy showing that the chemotherapy is working.    Dr. Cam Hai recommends continuing this medication.      4.  Insulin-dependent diabetes on Jardiance. Lantus  ISS  Accuchecks    5.  History of right lower extremity DVT May 2023   Eliquis.      6.  COPD-continue bronchodilators    7. DVT - prophylaxis - eliquis         Lines:     Patient Lines/Drains/Airways Status       Active PICC Line / CVC  Line / PIV Line / Drain / Airway / Intraosseous Line / Epidural Line / ART Line / Line / Wound / Pressure Ulcer / NG/OG Tube       Name Placement date Placement time Site Days    Puncture Site 09/09/22 Brachial Anterior;Distal;Right Arm 09/09/22  1409  -- less than 1                           Subjective     CC: Ischemic cardiomyopathy    Patient seen bedside  No acute issues overnight    Review of Systems:     As per HPI    Physical Exam:     VITAL SIGNS PHYSICAL EXAM   Temp:  [96.9 F (36.1 C)-98.4 F (36.9 C)] 96.9 F (36.1 C)  Heart Rate:  [80-104] 89  Resp Rate:  [17-21] 20  BP: (84-111)/(51-69) 108/63  Blood Glucose:    Telemetry:        Intake/Output Summary (Last 24 hours) at 09/11/2022 1454  Last data filed at 09/11/2022 1133  Gross per 24 hour   Intake 1434 ml   Output 3500 ml   Net -2066 ml      Physical Exam  General: awake, alert X 3  Cardiovascular: regular rate and rhythm, no murmurs, rubs or gallops  Lungs: clear to auscultation bilaterally, without wheezing, rhonchi, or rales  Abdomen: soft, non-tender, non-distended; no palpable masses,  normoactive bowel sounds  Extremities: no edema  Other:          Meds:     Medications were reviewed:  Current Facility-Administered Medications   Medication Dose Route Frequency    apixaban  5 mg Oral Q12H [facility]    aspirin EC  81 mg Oral Daily    atorvastatin  40 mg Oral QHS    fluticasone furoate-vilanterol  1 puff Inhalation QAM    hydrALAZINE  10 mg Oral Q8H SCH    insulin glargine  18 Units Subcutaneous QHS    insulin lispro  1-4 Units Subcutaneous QHS    insulin lispro  1-8 Units Subcutaneous TID AC    insulin lispro  6 Units Subcutaneous TID AC    isosorbide dinitrate  10 mg Oral TID - Nitrate Free Interval    [START ON 09/12/2022] metoprolol succinate XL  25 mg Oral Daily    acalabrutinib  100 mg Oral Q12H SCH    potassium chloride  40 mEq Oral BID    torsemide  60 mg Oral BID     Current Facility-Administered Medications   Medication Dose Route Frequency  Last Rate     Current Facility-Administered Medications   Medication Dose Route    acetaminophen  650 mg Oral    albuterol-ipratropium  3 mL Nebulization    benzocaine-menthol  1 lozenge Buccal    benzonatate  100 mg Oral    artificial tears (REFRESH PLUS)  1 drop Both Eyes    dextrose  15 g of glucose Oral    Or    dextrose  12.5 g Intravenous    Or    dextrose  12.5 g Intravenous    Or    glucagon (rDNA)  1 mg Intramuscular    melatonin  3 mg Oral    naloxone  0.2 mg Intravenous    saline  2 spray Each Nare         Labs:     Labs (last 72 hours):    Recent Labs   Lab 09/11/22  0326 09/10/22  0419   WBC 12.85* 12.64*   Hgb 13.2 14.1   Hematocrit 39.2 42.6   Platelets 198 164         Recent Labs   Lab 09/10/22  0419 09/07/22  1433   PT 16.9* 17.4*   PT INR 1.5* 1.5*   PTT 36 38      Recent Labs   Lab 09/11/22  0541 09/10/22  0419   Sodium 131* 137   Potassium 5.7* 4.2   Chloride 94* 94*   CO2 20 27   BUN 72.0* 59.0*   Creatinine 2.7* 2.2*   Calcium 9.8 9.8   Albumin 3.5 3.5   Protein, Total 7.3 7.2   Bilirubin, Total 1.7* 2.1*   Alkaline Phosphatase 89 83   ALT 10 11   AST (SGOT) 21 15   Glucose 183* 154*                     Microbiology, reviewed and are significant for:  Microbiology Results (last 15 days)       Procedure Component Value Units Date/Time    COVID-19 (SARS-CoV-2) and Influenza A/B & RSV (Cepheid)- Age less than 5 years [[id number]] Collected: 09/04/22 1225    Order Status: Completed Specimen: Nasopharyngeal Swab Updated: 09/04/22 1325     Purpose of COVID testing Diagnostic -PUI  SARS-CoV-2 Specimen Source Nasal Swab     SARS CoV 2 Overall Result Negative     Comment: Test performed using the Cepheid Xpert Xpress SARS-CoV-2 assay.  This assay is only for use under the Food and Drug  Administration's Emergency Use Authorization. This is a  real-time RT-PCR assay for the qualitative detection of  SARS-CoV-2 (COVID-19) RNA. Viral nucleic acids may persist in  vivo, independent of viability. Detection  of viral nucleic acid  does not imply the presence of infectious virus, or that virus  nucleic acid is the cause of clinical symptoms.  Test performance has not been established for immunocompromised  patients or patients without signs and symptoms of respiratory  infection. Negative results do not preclude SARS-CoV-2infection  and should not be used as the sole basis for patient  management decisions.  Invalid results may be due to inhibiting  substances in the specimen and recollection should occur.    Please see Fact Sheets for patients and providers located at:  http://www.rice.biz/          Influenza A Negative     Influenza B Negative     Respiratory Syncytial Virus Negative     Comment: Test performed using the Cepheid assay.  Please see Fact Sheets for patients and providers located at:  http://www.rice.biz/    Test performed using the Cepheid Xpert Xpress SARS-CoV-2/Flu/RSV  assay. This assay is only for use under the Food and Drug  Administration's Emergency Use Authorization.  This is a multiplexed  real-time RT-PCR test intended for the simultaneous qualitative  detection and differentiation of SARS-CoV-2, influenza A, influenza B,  and respiratory syncytial virus (RSV) viral RNA.  Viral nucleic acids  may persist in vivo, independent of viability. Detection of viral  nucleic acid does not imply the presence of infectious virus, or that  virus nucleic acid is the cause of clinical symptoms. If only one viral  target is positive but coinfection with multiple targets is suspected,  the sample should be re-tested with another FDA cleared, approved, or  authorized test, if coinfection would change clinical management.  Negative results do not preclude SARS-CoV-2, influenza A virus,  influenza B virus and/or RSV infection and should not be used as the  sole basis for treatment or other patient management decisions.  Invalid results may be due to inhibiting substances in the  specimen  and recollection should occur.         Narrative:      o Collect and clearly label specimen type:  o PREFERRED-Upper respiratory specimen: One Nasal Swab in  Transport Media.  o Hand deliver to laboratory ASAP  Diagnostic -PUI            Imaging, reviewed and are significant for:         Signed by: Elon Jester, MD

## 2022-09-12 LAB — BASIC METABOLIC PANEL
Anion Gap: 16 — ABNORMAL HIGH (ref 5.0–15.0)
BUN: 78 mg/dL — ABNORMAL HIGH (ref 9.0–28.0)
CO2: 23 mEq/L (ref 17–29)
Calcium: 10 mg/dL (ref 7.9–10.2)
Chloride: 96 mEq/L — ABNORMAL LOW (ref 99–111)
Creatinine: 2.4 mg/dL — ABNORMAL HIGH (ref 0.5–1.5)
Glucose: 162 mg/dL — ABNORMAL HIGH (ref 70–100)
Potassium: 4.9 mEq/L (ref 3.5–5.3)
Sodium: 135 mEq/L (ref 135–145)
eGFR: 27.3 mL/min/{1.73_m2} — AB (ref >=60)

## 2022-09-12 LAB — HEPATIC FUNCTION PANEL
ALT: 9 U/L (ref 0–55)
AST (SGOT): 12 U/L (ref 5–41)
Albumin/Globulin Ratio: 0.9 (ref 0.9–2.2)
Albumin: 3.6 g/dL (ref 3.5–5.0)
Alkaline Phosphatase: 87 U/L (ref 37–117)
Bilirubin Direct: 0.8 mg/dL — ABNORMAL HIGH (ref 0.0–0.5)
Bilirubin Indirect: 1.3 mg/dL — ABNORMAL HIGH (ref 0.2–1.0)
Bilirubin, Total: 2.1 mg/dL — ABNORMAL HIGH (ref 0.2–1.2)
Globulin: 3.9 g/dL — ABNORMAL HIGH (ref 2.0–3.6)
Protein, Total: 7.5 g/dL (ref 6.0–8.3)

## 2022-09-12 LAB — LACTIC ACID, PLASMA: Lactic Acid: 1.7 mmol/L (ref 0.2–2.0)

## 2022-09-12 LAB — CBC
Absolute NRBC: 0 10*3/uL (ref 0.00–0.00)
Hematocrit: 39.6 % (ref 37.6–49.6)
Hgb: 13.4 g/dL (ref 12.5–17.1)
MCH: 33.6 pg — ABNORMAL HIGH (ref 25.1–33.5)
MCHC: 33.8 g/dL (ref 31.5–35.8)
MCV: 99.2 fL — ABNORMAL HIGH (ref 78.0–96.0)
MPV: 12.6 fL — ABNORMAL HIGH (ref 8.9–12.5)
Nucleated RBC: 0 /100 WBC (ref 0.0–0.0)
Platelets: 160 10*3/uL (ref 142–346)
RBC: 3.99 10*6/uL — ABNORMAL LOW (ref 4.20–5.90)
RDW: 16 % — ABNORMAL HIGH (ref 11–15)
WBC: 11.6 10*3/uL — ABNORMAL HIGH (ref 3.10–9.50)

## 2022-09-12 LAB — GLUCOSE WHOLE BLOOD - POCT
Whole Blood Glucose POCT: 183 mg/dL — ABNORMAL HIGH (ref 70–100)
Whole Blood Glucose POCT: 248 mg/dL — ABNORMAL HIGH (ref 70–100)
Whole Blood Glucose POCT: 257 mg/dL — ABNORMAL HIGH (ref 70–100)
Whole Blood Glucose POCT: 251 mg/dL — ABNORMAL HIGH (ref 70–100)

## 2022-09-12 MED ORDER — TORSEMIDE 20 MG PO TABS
40.0000 mg | ORAL_TABLET | Freq: Two times a day (BID) | ORAL | Status: DC
Start: 2022-09-12 — End: 2022-09-14
  Administered 2022-09-12 – 2022-09-14 (×5): 40 mg via ORAL
  Filled 2022-09-12 (×5): qty 2

## 2022-09-12 MED ORDER — FLUTICASONE FUROATE-VILANTEROL 100-25 MCG/ACT IN AEPB
1.0000 | INHALATION_SPRAY | Freq: Every morning | RESPIRATORY_TRACT | Status: DC
Start: 2022-09-13 — End: 2022-09-14
  Administered 2022-09-13 – 2022-09-14 (×2): 1 via RESPIRATORY_TRACT

## 2022-09-12 NOTE — Plan of Care (Signed)
Problem: Hemodynamic Status: Cardiac  Goal: Stable vital signs and fluid balance  Outcome: Progressing  Flowsheets (Taken 09/12/2022 0422 by Avel Sensor, RN)  Stable vital signs and fluid balance:   Assess signs and symptoms associated with cardiac rhythm changes   Monitor lab values     Problem: Ineffective Gas Exchange  Goal: Effective breathing pattern  Outcome: Progressing  Flowsheets (Taken 09/09/2022 2030 by Elvera Bicker, RN)  Effective breathing pattern: Teach/reinforce use of ordered respiratory interventions (ie. CPAP, BiPAP, Incentive Spirometer, Acapella)     Problem: Everyday - Heart Failure  Goal: Nutritional Intake is Adequate  Outcome: Progressing  Flowsheets (Taken 09/12/2022 0422 by Avel Sensor, RN)  Nutritional Intake is Adequate:   Cardiac diet-2 gm Sodium   Fluid Restricction if needed   Consult/Collaborate with Nutritionist   Patient and family teaching on low sodium diet   Assess appetite,anorexia and amount of meal/food tolerated     Problem: Pain interferes with ability to perform ADL  Goal: Pain at adequate level as identified by patient  Outcome: Progressing  Flowsheets (Taken 09/12/2022 0422 by Avel Sensor, RN)  Pain at adequate level as identified by patient:   Identify patient comfort function goal   Assess for risk of opioid induced respiratory depression, including snoring/sleep apnea. Alert healthcare team of risk factors identified.   Reassess pain within 30-60 minutes of any procedure/intervention, per Pain Assessment, Intervention, Reassessment (AIR) Cycle   Offer non-pharmacological pain management interventions   Evaluate if patient comfort function goal is met   Evaluate patient's satisfaction with pain management progress

## 2022-09-12 NOTE — Progress Notes (Incomplete)
[facility] PROGRESS NOTE    [facility] CTU4  [address]  [place] [address]  Loc: [phone]    Date Time: 09/12/22 12:11 PM  Patient Name: [name] Day: 3    Subjective/Cardiac Relevant Events:   Sleepy this morning, says he wasn'[name] [name] able to sleep much overnight.  13 beats of VT this morning. Two episodes of NSVT yesterday lasting 12 beats and 7 beats   Creatinine this morning 2.7 and K 5.7    BP 90s-100s.  Net Negative 666 mL overnight.   Weight this AM 187lbs.  Unknown Dry weight, but given normal PCWP on RHC suspect dry weight is near 183-185    ASSESSMENT:   Patient is a 76 y.o. 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   NT-proBNP this AM 2,051.  S/P RHC 22 Nov 23 with Dr. Idolina Primer - normal PCWP.    Hemodynamics: RA 7. RV 39/4. PA 41/21/29. PCW 14/18/14. Sats: RA 59%, PA 62%, AO 95%, Room Air. Fick CO/CI 4.0/2.0, TD CO/CI 3.5/1.7. HR 80, BP (cuff) 115/72/89.   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  CLL - Rai stage IV - followed by [facility]  Moderate mitral and tricuspid valve regurgitation   Echo 21 Nov 23 - EF 21% (simpson's), marked LVE with LVIDD of 7cm, hypertrabeculated LV with severe global hypokinesis, grade II DD, mild AI, LAE with LAVI of 41.27mL/m2, mod MR, mild-mod TR, RVSP .   S/P initial Primary prevention initial St Jude single chamber ICD implant in 2012 with RV ICD lead Fx, now S/P abandonment of RV ICD lead and implant of Medtronic BiVentricular ICD 23 Jan 18 with Dr. Trish Mage.  HIS CRT-D System is NOT MRI Conditional.  Review of last remote interrogation from October 2023, with NSVT events, but no susatained arrhythmias.  BiVp 94.3% with effective CRT of 93.1%.  EKG 23 Nov 23 demonstrates optimal CRT paced waveform.  Optivol Over limit since mid Aug 2023.  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  BMI 25.01 today     RECOMMENDATIONS:     Recommend decreasing torsemide to once daily since patient is reaching euvolumia and creatinine is increasing. However will defer this to advanced heart failure team  Hyperkalemia this morning : torsemide as above. Obtain ECG and repeat K later this afternoon. Consider albuterol though this is only temporizing. If K remains elevated this afternoon consider Patiromer.   Continue aspirin 81 mg, atorvastatin 40 mg.  Continue Isordil 10 mg 3 times daily, hydralazine 10 mg 3 times daily  Increase metoprolol succinate 12.5 mg daily to 25mg  daily given NSVT,   Holding ARNI due to AKI, SGLT2 being held due to low creatinine clearance, MRA being held due to AKI / hyperkalemia.  Remains on Eliquis for right lower extremity DVT diagnosed 02/2022  LVAD workup ongoing.  Consider nephrology involvement   Continue supportive care.   Discussed with advanced heart failure and primary hosptialist team  [facility] team will formally see this  patient again on Monday unless any issues arise     Creta Levin, MD    Telemetry/Labs Reviewed/Intake-Output:     Telemetry reviewed: SR with occ PVC's and 8bt MM NSVT event overnight and HR 80-90's           Recent Labs   Lab 09/12/22  0304   Bilirubin, Total 2.1*   Bilirubin Direct 0.8*   Protein, Total 7.5   Albumin 3.6   ALT 9   AST (SGOT) 12       Recent Labs   Lab 09/10/22  0419   Magnesium 2.6       Recent Labs   Lab 09/10/22  0419   PT 16.9*   PT INR 1.5*   PTT 36       Recent Labs   Lab 09/12/22  0304 09/11/22  0326 09/10/22  0419   WBC 11.60* 12.85* 12.64*   Hgb 13.4 13.2 14.1   Hematocrit 39.6 39.2 42.6   Platelets 160 198 164       Recent Labs   Lab  09/12/22  0304 09/11/22  1659 09/11/22  0541   Sodium 135 132* 131*   Potassium 4.9 4.6 5.7*   Chloride 96* 93* 94*   CO2 23 25 20    BUN 78.0* 75.0* 72.0*   Creatinine 2.4* 2.6* 2.7*   eGFR 27.3* 24.8* 23.7*   Glucose 162* 317* 183*   Calcium 10.0 9.6 9.8       Lab Results   Component Value Date    BNP 2,051 (H) 09/10/2022      Intake/Output Summary (Last 24 hours) at 09/12/2022 1211  Last data filed at 09/12/2022 0959  Gross per 24 hour   Intake 690 ml   Output 2550 ml   Net -1860 ml       Medications:      Scheduled Meds: PRN Meds:    apixaban, 5 mg, Oral, Q12H [facility]  aspirin EC, 81 mg, Oral, Daily  atorvastatin, 40 mg, Oral, QHS  [START ON 09/13/2022] fluticasone furoate-vilanterol, 1 puff, Inhalation, QAM  hydrALAZINE, 10 mg, Oral, Q8H [facility]  insulin glargine, 18 Units, Subcutaneous, QHS  insulin lispro, 1-4 Units, Subcutaneous, QHS  insulin lispro, 1-8 Units, Subcutaneous, TID AC  insulin lispro, 6 Units, Subcutaneous, TID AC  isosorbide dinitrate, 10 mg, Oral, TID - Nitrate Free Interval  metoprolol succinate XL, 25 mg, Oral, Daily  acalabrutinib, 100 mg, Oral, Q12H [facility]  torsemide, 40 mg, Oral, BID        Continuous Infusions:   acetaminophen, 650 mg, Q6H PRN  albuterol-ipratropium, 3 mL, Q6H PRN  benzocaine-menthol, 1 lozenge, Q2H PRN  benzonatate, 100 mg, TID PRN  artificial tears (REFRESH PLUS), 1 drop, TID PRN  dextrose, 15 g of glucose, PRN   Or  dextrose, 12.5 g, PRN   Or  dextrose, 12.5 g, PRN   Or  glucagon (rDNA), 1 mg, PRN  melatonin, 3 mg, QHS PRN  naloxone, 0.2 mg, PRN  saline, 2 spray, Q4H PRN          Physical Exam:     Vitals:    09/12/22 0800 09/12/22 1000 09/12/22 1008 09/12/22 1117   BP: 104/73 92/54 92/54  93/58   Pulse: 82 82 84 82   Resp: 18   18   Temp: 98.4 F (36.9 C)   98.2 F (36.8 C)   TempSrc: Oral   Oral   SpO2: 97%   98%   Weight: 84.4 kg (186  lb 1.6 oz)      Height: 1.829 m (6' 0.01")            09/08/2022     3:51 AM 09/09/2022     3:12 AM 09/09/2022     1:00 PM 09/10/2022     4:02  AM 09/11/2022     5:48 AM 09/12/2022     6:00 AM 09/12/2022     8:00 AM   Weight Monitoring   Height   182.9 cm    182.9 cm   Height Method   Stated       Weight 85.095 kg 84.097 kg 83.87 kg 83.643 kg 85.231 kg 84.414 kg 84.414 kg   Weight Method Standing Scale Standing Scale Standing Scale Standing Scale Standing Scale Standing Scale    BMI (calculated)   25.1 kg/m2    25.3 kg/m2     Constitutional: Cooperative, alert, no acute distress.  Neck: No carotid bruits, JVP normal.  Cardiac: Regular rate and rhythm, normal S1 and S2; no S3 or S4, no murmurs, no rubs, no gallops.  Pulmonary: CTA B/L A&P.    Extremities: no edema.  Vascular: +2 pulses in radial artery bilaterally, 2+ pedal pulses bilaterally.    -------------------------------------------------------------------------------------  Signed by:         Karis Juba PhD, DrPH, DMSc, PA-C   Cardiology         [facility]    [facility] Contact Information   [facility]  Secure Chat (Group):   [facility] [place] [facility]    APP Spectralink:  [phone]    MD Spectralink :  [phone]  [phone]    After hours, non urgent consult line:  [phone]    After hours, physician on-call:  [phone] [facility]  Secure Chat (Group):   [facility] [place] [facility]    APP Spectralink:  [phone]    MD Spectralink :  [phone]      After hours, non urgent consult line:  [phone]    After hours, physician on-call:  [phone] [facility]  Secure Chat (Group):   [facility]    APP Spectralink:  [phone]    MD Spectralink :  [phone]      After hours, non urgent consult line:  [phone]    After hours, physician on-call:  [phone] [facility]  Secure Chat (Group):   [facility]    APP Spectralink:  [phone]    MD Spectralink :  [phone]      After hours, non urgent consult line:  [phone]    After hours, physician on-call:  [phone]       This note was generated by the Dragon speech recognition and may contain errors or  omissions not intended by the user. Grammatical errors, random word insertions, deletions, pronoun errors, and incomplete sentences are occasional consequences of this technology due to software limitations. Not all errors are caught or corrected. If there are questions or concerns about the content of this note or information contained within the body of this dictation, they should be addressed directly with the author for clarification.

## 2022-09-12 NOTE — Plan of Care (Signed)
Patient is alert and oriented x4, on RA, SR per tele. Gave prn tylenol for pain. Patient had to use the urinal frequently.   -LVAD workup     Safety  measures in placed.   Problem: Hemodynamic Status: Cardiac  Goal: Stable vital signs and fluid balance  Flowsheets (Taken 09/12/2022 0422)  Stable vital signs and fluid balance:   Assess signs and symptoms associated with cardiac rhythm changes   Monitor lab values     Problem: Inadequate Tissue Perfusion  Goal: Adequate tissue perfusion will be maintained  Flowsheets (Taken 09/12/2022 0422)  Adequate tissue perfusion will be maintained:   Monitor/assess lab values and report abnormal values   Monitor/assess neurovascular status (pulses, capillary refill, pain, paresthesia, paralysis, presence of edema)     Problem: Everyday - Heart Failure  Goal: Stable Vital Signs and Fluid Balance  Flowsheets (Taken 09/12/2022 0422)  Stable Vital Signs and Fluid Balance:   Daily Standing Weights in the morning using the same scale, after using the bathroom and before breadfast.  If unable to stand, zero the bed and use the bed scale   Monitor, assess vital signs and telemetry per policy   Monitor labs and report abnormalities to physician  Goal: Mobility/Activity is Maintained at Optimal Level for Patient  Flowsheets (Taken 09/12/2022 0422)  Mobility/Activity is Maintained at Optimal Level for Patient:   Increase mobility as tolerated/progressive mobility protocol   Perform active/passive ROM   Maintain SCD's as Ordered   Assess for changes in respiratory status, level of consciousness and/or development of fatigue  Goal: Nutritional Intake is Adequate  Flowsheets (Taken 09/12/2022 0422)  Nutritional Intake is Adequate:   Cardiac diet-2 gm Sodium   Fluid Restricction if needed   Consult/Collaborate with Nutritionist   Patient and family teaching on low sodium diet   Assess appetite,anorexia and amount of meal/food tolerated     Problem: Pain interferes with ability to perform  ADL  Goal: Pain at adequate level as identified by patient  Flowsheets (Taken 09/12/2022 0422)  Pain at adequate level as identified by patient:   Identify patient comfort function goal   Assess for risk of opioid induced respiratory depression, including snoring/sleep apnea. Alert healthcare team of risk factors identified.   Reassess pain within 30-60 minutes of any procedure/intervention, per Pain Assessment, Intervention, Reassessment (AIR) Cycle   Offer non-pharmacological pain management interventions   Evaluate if patient comfort function goal is met   Evaluate patient's satisfaction with pain management progress

## 2022-09-12 NOTE — Progress Notes (Signed)
Respiratory Therapy                              Patient Re-Assessment Note/Protocol Order Changes    Patient has been assessed and re-evaluated for the follow therapies:       Respiratory Orders   (From admission, onward)                 Start     Ordered    09/10/22 0900  Resp Re-Assess Adult (RT Use Only)  Every morning (RT)       09/09/22 2027    09/09/22 1326  MDI/DPI  Every morning (RT)      Comments:   All Adult patients ordered for Respiratory Therapy, i.e., inhaled meds, secretion clearance/lung expansion or Oxygen greater than 5 liters/min will be evaluated by a Respiratory Therapist and assessed per Respiratory Therapy Patient Driven Protocol.  Initial assessment and changes made per protocol can be found in the progress note section of the patient chart.    09/09/22 1325                  IP Meds - Nasal and Inhaled (From admission, onward)      Start     Stop Status Route Frequency Ordered    09/10/22 0900  fluticasone furoate-vilanterol (BREO ELLIPTA) 100-25 MCG/ACT 1 puff         -- Dispensed IN RT - Every morning 09/09/22 1325    09/09/22 1325  albuterol-ipratropium (DUO-NEB) 2.5-0.5(3) mg/3 mL nebulizer 3 mL         -- Verified NEBULIZATION RT - Every 6 hours as needed 09/09/22 1325                 Current Criteria For Therapy  Secretion Clearance: None indicated  Lung Expansion: None indicated  Medications: Home regimen (pt at baseline therapy)    Expected Outcomes          Meds: Other (Comment) (on home regimine)    Outcomes Met  Secretion: No  Lung Expansion: No  Meds: Other (Comment) (home regimine)       Reassessment Recommendations  Recommendations: Continue current treatment plan    Patient's orders have been modified as follows per RT Patient Driven Protocol.    PT will self administer.    If any questions, please contact the Respiratory Therapist assigned to this patient    Thank you
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
## 2022-09-12 NOTE — Progress Notes (Signed)
Advanced Heart Failure and Transplant Progress Note    Epic Chat Group: '[place] IP Advanced Heart Failure'    Primary Cardiologist: Marianna Fuss, MD   CV Surgeon: N/A  Referring Physician: Greggory Brandy, MD     Listed for transplant: N/A    Assessment:   76 y.o M with PMH CLL (on Calq) HLD, DM, recent RLE DVT, CKD/AKI, hx CLL, dilated NICM, severe LV dysfunction, admit recurrent acute-on-chronic systolic HF, with Lactate elevation per OSH labs concerning for possible CS, referred for RHC and advanced HF therapy evaluation.     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  If it keeps rising than worried more about a failure of oral therapy.    Recommendations:     Heart Failure Medical Therapy  BB: Metoprolol 12.5mg  daily  ACE/ARB/ARNI: Hold d/t AKI  SGLT2i: Hold Jardiance given low CrCl  MRA: Hold d/t AKI  Diuresis: Torsemide decrease to 40mg  BID   Hydralazine / Nitrates  Cardiac Device (ICD/ Cardiomems): Medtronic ICD  Home Regiment: Metoprolol 12.5mg , Torsemide 60mg  BID, Metolazone 10mg  daily, Valsartan 20mg  daily, Jardiance 10mg   NICM, EF 28%  RHC CI: 2.0 by Fick and 1.7 by thermal  Hemodynamics: RA 7. RV 39/4. PA 41/21/29. PCW 14/18/14. Sats: RA 59%, PA 62%, AO 95%, Room Air. Fick CO/CI 4.0/2.0, TD CO/CI 3.5/1.7. HR 80, BP (cuff) 115/72/89.  BP hold parameter to hold for SBP<88  AKI on CKD3a (baseline Cr 2.0, at baseline)  Avoid nephrotoxic medications  Hold ACEi/ARB/ARNI, MRA and Jardiance  If worsening, this limits restarting full GDMT   Lactic acidosis  Lactic acid elevated to 2.5 resolveing  Continue to trend, if rising, consider repeat RHC on Monday  T2DM, insulin-dependent  RLE DVT  (02/2022)  Continue Eliquis    Signed by: Marko Stai, MD             Subjective/24 hour events:   No acute events overnight. Sleeping well this am    Objective:   Vital Signs in last 24 hours:  Temp:  [98.2 F (36.8 C)-98.5 F (36.9 C)] 98.2 F (36.8 C)  Heart Rate:  [82-98] 82  Resp Rate:  [18-22] 18  BP: (92-121)/(52-73) 93/58  SpO2: 98 %  O2 Device: None (Room air)      Telemetry: NSR  Report: NSVT this morning and last night ~7pm    Hemodynamics:    Intake / Output:    Intake/Output Summary (Last 24 hours) at 09/12/2022 1121  Last data filed at 09/12/2022 0959  Gross per 24 hour   Intake 690 ml   Output 2850 ml   Net -2160 ml            Weight:      09/11/2022     5:48 AM 09/12/2022     6:00 AM 09/12/2022     8:00 AM   Weight Monitoring   Height   182.9 cm   Weight 85.231 kg 84.414 kg 84.414 kg   Weight Method Standing Scale Standing Scale    BMI (calculated)   25.3 kg/m2        Weight change: -0.816 kg (-1 lb 12.8 oz)  Physical Exam:   General: well-developed, alert, NAD  HEENT: conjunctiva pale, buccal mucosa moist, anicteric sclera, no erythema, JVP <10cm H2O  Lungs: CTA B/L, no rhonchi, wheezes or crackles with normal rate and effort  Cardiovascular: S1, S2, normal rate, regular rhythm, no m/r/g   Abdominal: soft, NT/ND, +B.S., no HSM  Extremities: warm to touch, no cyanosis, no edema, peripheral pulses present bilaterally  Neuromuscular exam: alert and oriented x 3, speech fluent   Skin: warm     LDAs:  Patient Lines/Drains/Airways Status       Active Lines, Drains and Airways       Name Placement date Placement time Site Days    Peripheral IV 09/10/22 22 G Standard Posterior;Right Hand 09/10/22  0400  Hand  2                      Current Meds:  apixaban, 5 mg, Oral, Q12H SCH  aspirin EC, 81 mg, Oral, Daily  atorvastatin, 40 mg, Oral, QHS  [START ON 09/13/2022] fluticasone furoate-vilanterol, 1 puff, Inhalation, QAM  hydrALAZINE, 10 mg, Oral, Q8H SCH  insulin glargine, 18 Units, Subcutaneous,  QHS  insulin lispro, 1-4 Units, Subcutaneous, QHS  insulin lispro, 1-8 Units, Subcutaneous, TID AC  insulin lispro, 6 Units, Subcutaneous, TID AC  isosorbide dinitrate, 10 mg, Oral, TID - Nitrate Free Interval  metoprolol succinate XL, 25 mg, Oral, Daily  acalabrutinib, 100 mg, Oral, Q12H SCH  torsemide, 60 mg, Oral, BID         Drips:        Labs:  CBC        09/12/22  0304 09/11/22  0326 09/10/22  0419   WBC 11.60* 12.85* 12.64*   Hgb 13.4 13.2 14.1   Hematocrit 39.6 39.2 42.6   Platelets 160 198 164         BMP        09/12/22  0304 09/11/22  1659 09/11/22  0541 09/11/22  0326 09/10/22  0724 09/10/22  0419 09/08/22  1718 09/08/22  0743 09/07/22  1433 09/07/22  0657   BUN 78.0* 75.0* 72.0*  --   --  59.0*   < > 55.0*  --  53.0*   Creatinine 2.4* 2.6* 2.7*  --   --  2.2*   < > 2.1*  --  1.9*   eGFR 27.3* 24.8* 23.7*  --   --  30.3*   < > 32.1*  --  36.2*   Sodium 135 132* 131*  --   --  137   < > 140  --  139   Potassium 4.9 4.6 5.7*  --   --  4.2   < > 3.4*  --  4.3   Chloride 96* 93* 94*  --   --  94*   < > 92*  --  98*   CO2 23 25 20   --   --  27   < > 36*  --  29   Calcium 10.0 9.6 9.8  --   --  9.8   < > 9.8  --  9.4   Magnesium  --   --   --   --   --  2.6  --  2.3  --  2.2   Lactic Acid 1.7 1.8  --  1.6   < > 2.3*   < >  --   --   --    NT-proBNP  --   --   --   --   --  2,051*  --   --   --   --    LDH  --   --   --   --   --  327  --   --  190  --     < > = values in this interval not displayed.         LFTs        09/12/22  0304 09/11/22  0541 09/10/22  0419   AST (SGOT) 12 21 15    ALT 9 10 11    Alkaline Phosphatase 87 89 83   Bilirubin, Total 2.1* 1.7* 2.1*   Bilirubin Direct 0.8* 0.5 0.7*   Bilirubin Indirect 1.3* 1.2* 1.4*         Coags        09/10/22  0419 09/07/22  1433   PT 16.9* 17.4*   PT INR 1.5* 1.5*   PTT 36 38         ABGs          Microbiology  Microbiology Results (last 15 days)       Procedure Component Value Units Date/Time    COVID-19 (SARS-CoV-2) and Influenza A/B & RSV (Cepheid)- Age  less than 5 years [[id number]] Collected: 09/04/22 1225    Order Status: Completed Specimen: Nasopharyngeal Swab Updated: 09/04/22 1325     Purpose of COVID testing Diagnostic -PUI     SARS-CoV-2 Specimen Source Nasal Swab     SARS CoV 2 Overall Result Negative     Comment: Test performed using the Cepheid Xpert Xpress SARS-CoV-2 assay.  This assay is only for use under the Food and Drug  Administration's Emergency Use Authorization. This is a  real-time RT-PCR assay for the qualitative detection of  SARS-CoV-2 (COVID-19) RNA. Viral nucleic acids may persist in  vivo, independent of viability. Detection of viral nucleic acid  does not imply the presence of infectious virus, or that virus  nucleic acid is the cause of clinical symptoms.  Test performance has not been established for immunocompromised  patients or patients without signs and symptoms of respiratory  infection. Negative results do not preclude SARS-CoV-2infection  and should not be used as the sole basis for patient  management decisions.  Invalid results may be due to inhibiting  substances in the specimen and recollection should occur.    Please see Fact Sheets for patients and providers located at:  http://www.rice.biz/          Influenza A Negative     Influenza B Negative     Respiratory Syncytial Virus Negative     Comment: Test performed using the Cepheid assay.  Please see Fact Sheets for patients and providers located at:  http://www.rice.biz/    Test performed using the Cepheid Xpert Xpress SARS-CoV-2/Flu/RSV  assay. This assay is only for use under the Food and Drug  Administration's Emergency Use Authorization.  This is a multiplexed  real-time RT-PCR test intended for the simultaneous qualitative  detection and differentiation of SARS-CoV-2, influenza A, influenza B,  and respiratory syncytial virus (RSV) viral RNA.  Viral nucleic acids  may persist in vivo, independent of viability. Detection of  viral  nucleic acid does not imply the presence of infectious virus, or that  virus nucleic acid is the cause of clinical symptoms. If only one viral  target is positive but coinfection with multiple targets is suspected,  the sample should be re-tested with another FDA cleared, approved, or  authorized test, if coinfection would change clinical  management.  Negative results do not preclude SARS-CoV-2, influenza A virus,  influenza B virus and/or RSV infection and should not be used as the  sole basis for treatment or other patient management decisions.  Invalid results may be due to inhibiting substances in the specimen  and recollection should occur.         Narrative:      o Collect and clearly label specimen type:  o PREFERRED-Upper respiratory specimen: One Nasal Swab in  Transport Media.  o Hand deliver to laboratory ASAP  Diagnostic -PUI              Cardiac Studies:  Echocardiogram:   09/07/2022  Summary    * Markedly dilated left ventricle with severely reduced systolic function  (LVEDVi 109 mL/m2, ejection fraction 21%).    * Hypertrabeculated left ventricle with severe global hypokinesis.    * Mild aortic valve and moderate mitral valve regurgitation (details below).    * Wire in right-sided chambers with mild to moderate (2+) peri-device  tricuspid valve regurgitation.    * Dilated, poorly responsive IVC.    * When visually compared with the prior study dated 05/29/2021, LV function  is less vigorous (previous EF 31%, now 21%; previous LVOT VTI 17 cm, now 11  cm); valvular regurgitation is less prominent.    Right Heart Catheterization:  RA 7. RV 39/4. PA 41/21/29. PCW 14/18/14. Sats: RA 59%, PA 62%, AO 95%, Room Air. Fick CO/CI 4.0/2.0, TD CO/CI 3.5/1.7. HR 80, BP (cuff) 115/72/89.     Imaging:  Radiology Results (24 Hour)       ** No results found for the last 24 hours. **

## 2022-09-13 LAB — CBC
Absolute NRBC: 0 10*3/uL (ref 0.00–0.00)
Hematocrit: 38.6 % (ref 37.6–49.6)
Hgb: 12.8 g/dL (ref 12.5–17.1)
MCH: 32.7 pg (ref 25.1–33.5)
MCHC: 33.2 g/dL (ref 31.5–35.8)
MCV: 98.5 fL — ABNORMAL HIGH (ref 78.0–96.0)
MPV: 12.2 fL (ref 8.9–12.5)
Nucleated RBC: 0 /100 WBC (ref 0.0–0.0)
Platelets: 154 10*3/uL (ref 142–346)
RBC: 3.92 10*6/uL — ABNORMAL LOW (ref 4.20–5.90)
RDW: 16 % — ABNORMAL HIGH (ref 11–15)
WBC: 15.49 10*3/uL — ABNORMAL HIGH (ref 3.10–9.50)

## 2022-09-13 LAB — HEPATIC FUNCTION PANEL
ALT: 10 U/L (ref 0–55)
AST (SGOT): 12 U/L (ref 5–41)
Albumin/Globulin Ratio: 0.9 (ref 0.9–2.2)
Albumin: 3.4 g/dL — ABNORMAL LOW (ref 3.5–5.0)
Alkaline Phosphatase: 94 U/L (ref 37–117)
Bilirubin Direct: 0.5 mg/dL (ref 0.0–0.5)
Bilirubin Indirect: 0.6 mg/dL (ref 0.2–1.0)
Bilirubin, Total: 1.1 mg/dL (ref 0.2–1.2)
Globulin: 3.9 g/dL — ABNORMAL HIGH (ref 2.0–3.6)
Protein, Total: 7.3 g/dL (ref 6.0–8.3)

## 2022-09-13 LAB — GLUCOSE WHOLE BLOOD - POCT
Whole Blood Glucose POCT: 189 mg/dL — ABNORMAL HIGH (ref 70–100)
Whole Blood Glucose POCT: 257 mg/dL — ABNORMAL HIGH (ref 70–100)
Whole Blood Glucose POCT: 319 mg/dL — ABNORMAL HIGH (ref 70–100)
Whole Blood Glucose POCT: 300 mg/dL — ABNORMAL HIGH (ref 70–100)

## 2022-09-13 LAB — BASIC METABOLIC PANEL
Anion Gap: 14 (ref 5.0–15.0)
BUN: 78 mg/dL — ABNORMAL HIGH (ref 9.0–28.0)
CO2: 23 mEq/L (ref 17–29)
Calcium: 9.6 mg/dL (ref 7.9–10.2)
Chloride: 97 mEq/L — ABNORMAL LOW (ref 99–111)
Creatinine: 2.2 mg/dL — ABNORMAL HIGH (ref 0.5–1.5)
Glucose: 227 mg/dL — ABNORMAL HIGH (ref 70–100)
Potassium: 4.4 mEq/L (ref 3.5–5.3)
Sodium: 134 mEq/L — ABNORMAL LOW (ref 135–145)
eGFR: 30.3 mL/min/{1.73_m2} — AB (ref >=60)

## 2022-09-13 MED ORDER — HYDRALAZINE HCL 25 MG PO TABS
25.0000 mg | ORAL_TABLET | Freq: Three times a day (TID) | ORAL | Status: DC
Start: 2022-09-13 — End: 2022-09-14
  Administered 2022-09-13 – 2022-09-14 (×4): 25 mg via ORAL
  Filled 2022-09-13 (×4): qty 1

## 2022-09-13 NOTE — Progress Notes (Signed)
MEDICINE PROGRESS NOTE    Date Time: 09/13/22 2:41 PM  Patient Name: [name] [name]  Attending Physician: Elon Jester, MD    Assessment:   76 year old male past medical history of chronic systolic heart failure ejection fraction 21% down from the 31%, negative cardiac catheterization 2014, that is post Medtronic ICD, CKD 3B, insulin-dependent diabetes, stage IV CLL on CALQUENCE( acalabrutinib)  by [place] cancer specialist, COPD, history of right proximal DVT (02/2022) on Eliquis presented to [facility] on day 11/17 with shortness of breath, dyspnea on exertion, orthopnea, lower extremity edema, syncope while driving, and 8 pound weight gain in 2 weeks.  Patient had issues with low systolic blood pressures which limited the use of guidance directed medical therapy but did tolerate diuretics with weight loss.  It was unclear if patient still needed diuresis and was sent to [facility] for right heart cath to evaluate fluid status and restratification.  Patient is not a heart transplant candidate but can be evaluated for LVAD.     Plan:   1.  Acute on chronic systolic heart failure, most recent ejection fraction 21% with negative cardiac catheterization in the past, status post ICD whose course has been complicated by significant hypotension limiting the use of goal-directed medical therapy.   LVAD eval  Fu cards recs    2.  CKD stage IIIb  - Cr 2.2  - Monitor    3.  Stage IV CLL on CALQUENCE( acalabrutinib) [place] [facility] specialist, patient seen by Dr. Laurine Blazer on 11/22.    CT chest abdomen pelvis shows significant decrease in lymphadenopathy showing that the chemotherapy is working.    Dr. Cam Hai recommends continuing this medication.      4.  Insulin-dependent diabetes on Jardiance. Lantus  ISS  Accuchecks    5.  History of right lower extremity DVT May 2023   Eliquis.      6.  COPD-continue bronchodilators    7. DVT - prophylaxis - eliquis         Lines:     Patient Lines/Drains/Airways  Status       Active PICC Line / CVC Line / PIV Line / Drain / Airway / Intraosseous Line / Epidural Line / ART Line / Line / Wound / Pressure Ulcer / NG/OG Tube       Name Placement date Placement time Site Days    Puncture Site 09/09/22 Brachial Anterior;Distal;Right Arm 09/09/22  1409  -- less than 1                           Subjective     CC: Ischemic cardiomyopathy    Seen bedside  Family member present  No complaints  No acute issues overnight    Review of Systems:     As per HPI    Physical Exam:     VITAL SIGNS PHYSICAL EXAM   Temp:  [97.6 F (36.4 C)-98.8 F (37.1 C)] 98 F (36.7 C)  Heart Rate:  [80-95] 88  Resp Rate:  [18-20] 18  BP: (92-120)/(55-73) 107/61  Blood Glucose:    Telemetry:        Intake/Output Summary (Last 24 hours) at 09/13/2022 1441  Last data filed at 09/13/2022 1128  Gross per 24 hour   Intake 1091 ml   Output 1975 ml   Net -884 ml      Physical Exam  General: awake, alert X 3  Cardiovascular: regular rate and rhythm,  no murmurs, rubs or gallops  Lungs: clear to auscultation bilaterally, without wheezing, rhonchi, or rales  Abdomen: soft, non-tender, non-distended; no palpable masses,  normoactive bowel sounds  Extremities: no edema  Other:          Meds:     Medications were reviewed:  Current Facility-Administered Medications   Medication Dose Route Frequency    apixaban  5 mg Oral Q12H [facility]    aspirin EC  81 mg Oral Daily    atorvastatin  40 mg Oral QHS    fluticasone furoate-vilanterol  1 puff Inhalation QAM    hydrALAZINE  25 mg Oral Q8H SCH    insulin glargine  18 Units Subcutaneous QHS    insulin lispro  1-4 Units Subcutaneous QHS    insulin lispro  1-8 Units Subcutaneous TID AC    insulin lispro  6 Units Subcutaneous TID AC    isosorbide dinitrate  10 mg Oral TID - Nitrate Free Interval    metoprolol succinate XL  25 mg Oral Daily    acalabrutinib  100 mg Oral Q12H SCH    torsemide  40 mg Oral BID     Current Facility-Administered Medications   Medication Dose Route Frequency  Last Rate     Current Facility-Administered Medications   Medication Dose Route    acetaminophen  650 mg Oral    albuterol-ipratropium  3 mL Nebulization    benzocaine-menthol  1 lozenge Buccal    benzonatate  100 mg Oral    artificial tears (REFRESH PLUS)  1 drop Both Eyes    dextrose  15 g of glucose Oral    Or    dextrose  12.5 g Intravenous    Or    dextrose  12.5 g Intravenous    Or    glucagon (rDNA)  1 mg Intramuscular    melatonin  3 mg Oral    naloxone  0.2 mg Intravenous    saline  2 spray Each Nare         Labs:     Labs (last 72 hours):    Recent Labs   Lab 09/13/22  0239 09/12/22  0304   WBC 15.49* 11.60*   Hgb 12.8 13.4   Hematocrit 38.6 39.6   Platelets 154 160         Recent Labs   Lab 09/10/22  0419 09/07/22  1433   PT 16.9* 17.4*   PT INR 1.5* 1.5*   PTT 36 38      Recent Labs   Lab 09/13/22  0239 09/12/22  0304   Sodium 134* 135   Potassium 4.4 4.9   Chloride 97* 96*   CO2 23 23   BUN 78.0* 78.0*   Creatinine 2.2* 2.4*   Calcium 9.6 10.0   Albumin 3.4* 3.6   Protein, Total 7.3 7.5   Bilirubin, Total 1.1 2.1*   Alkaline Phosphatase 94 87   ALT 10 9   AST (SGOT) 12 12   Glucose 227* 162*                     Microbiology, reviewed and are significant for:  Microbiology Results (last 15 days)       Procedure Component Value Units Date/Time    COVID-19 (SARS-CoV-2) and Influenza A/B & RSV (Cepheid)- Age less than 5 years [[id number]] Collected: 09/04/22 1225    Order Status: Completed Specimen: Nasopharyngeal Swab Updated: 09/04/22 1325     Purpose of COVID testing Diagnostic -  PUI     SARS-CoV-2 Specimen Source Nasal Swab     SARS CoV 2 Overall Result Negative     Comment: Test performed using the Cepheid Xpert Xpress SARS-CoV-2 assay.  This assay is only for use under the Food and Drug  Administration's Emergency Use Authorization. This is a  real-time RT-PCR assay for the qualitative detection of  SARS-CoV-2 (COVID-19) RNA. Viral nucleic acids may persist in  vivo, independent of viability. Detection  of viral nucleic acid  does not imply the presence of infectious virus, or that virus  nucleic acid is the cause of clinical symptoms.  Test performance has not been established for immunocompromised  patients or patients without signs and symptoms of respiratory  infection. Negative results do not preclude SARS-CoV-2infection  and should not be used as the sole basis for patient  management decisions.  Invalid results may be due to inhibiting  substances in the specimen and recollection should occur.    Please see Fact Sheets for patients and providers located at:  http://www.rice.biz/          Influenza A Negative     Influenza B Negative     Respiratory Syncytial Virus Negative     Comment: Test performed using the Cepheid assay.  Please see Fact Sheets for patients and providers located at:  http://www.rice.biz/    Test performed using the Cepheid Xpert Xpress SARS-CoV-2/Flu/RSV  assay. This assay is only for use under the Food and Drug  Administration's Emergency Use Authorization.  This is a multiplexed  real-time RT-PCR test intended for the simultaneous qualitative  detection and differentiation of SARS-CoV-2, influenza A, influenza B,  and respiratory syncytial virus (RSV) viral RNA.  Viral nucleic acids  may persist in vivo, independent of viability. Detection of viral  nucleic acid does not imply the presence of infectious virus, or that  virus nucleic acid is the cause of clinical symptoms. If only one viral  target is positive but coinfection with multiple targets is suspected,  the sample should be re-tested with another FDA cleared, approved, or  authorized test, if coinfection would change clinical management.  Negative results do not preclude SARS-CoV-2, influenza A virus,  influenza B virus and/or RSV infection and should not be used as the  sole basis for treatment or other patient management decisions.  Invalid results may be due to inhibiting substances in the  specimen  and recollection should occur.         Narrative:      o Collect and clearly label specimen type:  o PREFERRED-Upper respiratory specimen: One Nasal Swab in  Transport Media.  o Hand deliver to laboratory ASAP  Diagnostic -PUI            Imaging, reviewed and are significant for:         Signed by: Elon Jester, MD

## 2022-09-13 NOTE — Plan of Care (Signed)
Pt name/Code: [name] [name] (76 y.o. male);Full Code  Admit Date/Dx: 09/09/2022 Ischemic cardiomyopathy  Weights: Last 3 Weights for the past 72 hrs (Last 3 readings):   Weight   09/13/22 0300 85.9 kg (189 lb 6.4 oz)   09/12/22 0800 84.4 kg (186 lb 1.6 oz)   09/12/22 0600 84.4 kg (186 lb 1.6 oz)       Plan:   - LVAD workup  - trending lactic acid 11/27 0600, if rising, possible repeat RHC on Monday  - monitor BG

## 2022-09-13 NOTE — Progress Notes (Signed)
Advanced Heart Failure and Transplant Progress Note    Epic Chat Group: '[place] IP Advanced Heart Failure'    Primary Cardiologist: Marianna Fuss, MD   CV Surgeon: N/A  Referring Physician: Greggory Brandy, MD     Listed for transplant: N/A    Assessment:   76 y.o M with PMH CLL (on Calq) HLD, DM, recent RLE DVT, CKD/AKI, hx CLL, dilated NICM, severe LV dysfunction, admit recurrent acute-on-chronic systolic HF, with Lactate elevation per OSH labs concerning for possible CS, referred for RHC and advanced HF therapy evaluation.     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  If it keeps rising than worried more about a failure of oral therapy.  Tolerating vasodilators  Increased WBC    Recommendations:     Heart Failure Medical Therapy  BB: Metoprolol 12.5mg  daily  ACE/ARB/ARNI: Hold d/t AKI  SGLT2i: Hold Jardiance given low CrCl  MRA: Hold d/t AKI  Diuresis: Torsemide decrease to 40mg  BID   Hydralazine / Nitrates. Increase hydralazine today  Cardiac Device (ICD/ Cardiomems): Medtronic ICD  Home Regiment: Metoprolol 12.5mg , Torsemide 60mg  BID, Metolazone 10mg  daily, Valsartan 20mg  daily, Jardiance 10mg   NICM, EF 28%  RHC CI: 2.0 by Fick and 1.7 by thermal  Hemodynamics: RA 7. RV 39/4. PA 41/21/29. PCW 14/18/14. Sats: RA 59%, PA 62%, AO 95%, Room Air. Fick CO/CI 4.0/2.0, TD CO/CI 3.5/1.7. HR 80, BP (cuff) 115/72/89.  BP hold parameter to hold for SBP<88  AKI on CKD3a (baseline Cr 2.0, at baseline)  Avoid nephrotoxic medications  Hold ACEi/ARB/ARNI, MRA and Jardiance  If worsening, this limits restarting full GDMT   Lactic acidosis  Lactic acid elevated to 2.5 resolveing  Continue to trend tomorrow, if rising,  consider repeat RHC on Monday  T2DM, insulin-dependent  RLE DVT (02/2022)  Continue Eliquis    Signed by: Marko Stai, MD             Subjective/24 hour events:   No acute events overnight.   Ambulated  No dizzyness    Objective:   Vital Signs in last 24 hours:  Temp:  [97.6 F (36.4 C)-98.8 F (37.1 C)] 97.9 F (36.6 C)  Heart Rate:  [80-95] 90  Resp Rate:  [18-20] 18  BP: (92-120)/(54-73) 107/70  SpO2: 96 %  O2 Device: None (Room air)      Telemetry: NSR  Report:     Hemodynamics:    Intake / Output:    Intake/Output Summary (Last 24 hours) at 09/13/2022 0941  Last data filed at 09/13/2022 0900  Gross per 24 hour   Intake 1903 ml   Output 2325 ml   Net -422 ml            Weight:      09/12/2022     6:00 AM 09/12/2022     8:00 AM 09/13/2022     3:00 AM   Weight Monitoring   Height  182.9 cm    Weight 84.414 kg 84.414 kg 85.911 kg   Weight Method Standing Scale     BMI (calculated)  25.3 kg/m2         Weight change: -0 kg (-0 oz)  Physical Exam:   General: well-developed, alert, NAD  HEENT: conjunctiva pale, buccal mucosa moist, anicteric sclera, no erythema, JVP <10cm H2O  Lungs: CTA B/L, no rhonchi, wheezes or crackles with normal rate and effort  Cardiovascular: S1, S2, normal rate, regular rhythm, no m/r/g   Abdominal: soft, NT/ND, +B.S., no HSM  Extremities: warm to touch, no cyanosis, no edema, peripheral pulses present bilaterally  Neuromuscular exam: alert and oriented x 3, speech fluent   Skin: warm     LDAs:  Patient Lines/Drains/Airways Status       Active Lines, Drains and Airways       Name Placement date Placement time Site Days    Peripheral IV 09/10/22 22 G Standard Posterior;Right Hand 09/10/22  0400  Hand  3                      Current Meds:  apixaban, 5 mg, Oral, Q12H SCH  aspirin EC, 81 mg, Oral, Daily  atorvastatin, 40 mg, Oral, QHS  fluticasone furoate-vilanterol, 1 puff, Inhalation, QAM  hydrALAZINE, 25 mg, Oral, Q8H SCH  insulin glargine, 18 Units, Subcutaneous, QHS  insulin  lispro, 1-4 Units, Subcutaneous, QHS  insulin lispro, 1-8 Units, Subcutaneous, TID AC  insulin lispro, 6 Units, Subcutaneous, TID AC  isosorbide dinitrate, 10 mg, Oral, TID - Nitrate Free Interval  metoprolol succinate XL, 25 mg, Oral, Daily  acalabrutinib, 100 mg, Oral, Q12H SCH  torsemide, 40 mg, Oral, BID         Drips:        Labs:  CBC        09/13/22  0239 09/12/22  0304 09/11/22  0326   WBC 15.49* 11.60* 12.85*   Hgb 12.8 13.4 13.2   Hematocrit 38.6 39.6 39.2   Platelets 154 160 198         BMP        09/13/22  0239 09/12/22  0304 09/11/22  1659 09/11/22  0541 09/11/22  0326 09/10/22  0724 09/10/22  0419 09/08/22  1718 09/08/22  0743 09/07/22  1433   BUN 78.0* 78.0* 75.0*   < >  --   --  59.0*   < > 55.0*  --    Creatinine 2.2* 2.4* 2.6*   < >  --   --  2.2*   < > 2.1*  --    eGFR 30.3* 27.3* 24.8*   < >  --   --  30.3*   < > 32.1*  --    Sodium 134* 135 132*   < >  --   --  137   < > 140  --    Potassium 4.4 4.9 4.6   < >  --   --  4.2   < > 3.4*  --    Chloride 97* 96* 93*   < >  --   --  94*   < > 92*  --    CO2 23 23 25    < >  --   --  27   < > 36*  --    Calcium 9.6 10.0 9.6   < >  --   --  9.8   < > 9.8  --    Magnesium  --   --   --   --   --   --  2.6  --  2.3  --    Lactic Acid  --  1.7 1.8  --  1.6   < >  2.3*   < >  --   --    NT-proBNP  --   --   --   --   --   --  2,051*  --   --   --    LDH  --   --   --   --   --   --  327  --   --  190    < > = values in this interval not displayed.         LFTs        09/13/22  0239 09/12/22  0304 09/11/22  0541   AST (SGOT) 12 12 21    ALT 10 9 10    Alkaline Phosphatase 94 87 89   Bilirubin, Total 1.1 2.1* 1.7*   Bilirubin Direct 0.5 0.8* 0.5   Bilirubin Indirect 0.6 1.3* 1.2*         Coags        09/10/22  0419 09/07/22  1433   PT 16.9* 17.4*   PT INR 1.5* 1.5*   PTT 36 38         ABGs          Microbiology  Microbiology Results (last 15 days)       Procedure Component Value Units Date/Time    COVID-19 (SARS-CoV-2) and Influenza A/B & RSV (Cepheid)- Age less  than 5 years [[id number]] Collected: 09/04/22 1225    Order Status: Completed Specimen: Nasopharyngeal Swab Updated: 09/04/22 1325     Purpose of COVID testing Diagnostic -PUI     SARS-CoV-2 Specimen Source Nasal Swab     SARS CoV 2 Overall Result Negative     Comment: Test performed using the Cepheid Xpert Xpress SARS-CoV-2 assay.  This assay is only for use under the Food and Drug  Administration's Emergency Use Authorization. This is a  real-time RT-PCR assay for the qualitative detection of  SARS-CoV-2 (COVID-19) RNA. Viral nucleic acids may persist in  vivo, independent of viability. Detection of viral nucleic acid  does not imply the presence of infectious virus, or that virus  nucleic acid is the cause of clinical symptoms.  Test performance has not been established for immunocompromised  patients or patients without signs and symptoms of respiratory  infection. Negative results do not preclude SARS-CoV-2infection  and should not be used as the sole basis for patient  management decisions.  Invalid results may be due to inhibiting  substances in the specimen and recollection should occur.    Please see Fact Sheets for patients and providers located at:  http://www.rice.biz/          Influenza A Negative     Influenza B Negative     Respiratory Syncytial Virus Negative     Comment: Test performed using the Cepheid assay.  Please see Fact Sheets for patients and providers located at:  http://www.rice.biz/    Test performed using the Cepheid Xpert Xpress SARS-CoV-2/Flu/RSV  assay. This assay is only for use under the Food and Drug  Administration's Emergency Use Authorization.  This is a multiplexed  real-time RT-PCR test intended for the simultaneous qualitative  detection and differentiation of SARS-CoV-2, influenza A, influenza B,  and respiratory syncytial virus (RSV) viral RNA.  Viral nucleic acids  may persist in vivo, independent of viability. Detection of viral  nucleic  acid does not imply the presence of infectious virus, or that  virus nucleic acid is the cause of clinical symptoms. If only one viral  target  is positive but coinfection with multiple targets is suspected,  the sample should be re-tested with another FDA cleared, approved, or  authorized test, if coinfection would change clinical management.  Negative results do not preclude SARS-CoV-2, influenza A virus,  influenza B virus and/or RSV infection and should not be used as the  sole basis for treatment or other patient management decisions.  Invalid results may be due to inhibiting substances in the specimen  and recollection should occur.         Narrative:      o Collect and clearly label specimen type:  o PREFERRED-Upper respiratory specimen: One Nasal Swab in  Transport Media.  o Hand deliver to laboratory ASAP  Diagnostic -PUI              Cardiac Studies:  Echocardiogram:   09/07/2022  Summary    * Markedly dilated left ventricle with severely reduced systolic function  (LVEDVi 109 mL/m2, ejection fraction 21%).    * Hypertrabeculated left ventricle with severe global hypokinesis.    * Mild aortic valve and moderate mitral valve regurgitation (details below).    * Wire in right-sided chambers with mild to moderate (2+) peri-device  tricuspid valve regurgitation.    * Dilated, poorly responsive IVC.    * When visually compared with the prior study dated 05/29/2021, LV function  is less vigorous (previous EF 31%, now 21%; previous LVOT VTI 17 cm, now 11  cm); valvular regurgitation is less prominent.    Right Heart Catheterization:  RA 7. RV 39/4. PA 41/21/29. PCW 14/18/14. Sats: RA 59%, PA 62%, AO 95%, Room Air. Fick CO/CI 4.0/2.0, TD CO/CI 3.5/1.7. HR 80, BP (cuff) 115/72/89.     Imaging:  Radiology Results (24 Hour)       ** No results found for the last 24 hours. **

## 2022-09-13 NOTE — Plan of Care (Signed)
Patient is alert and oriented x4, follows commands. On RA. SR on tele. PRN tylenol given x1 for headache, effective. Ambulate around the unit. Used urinal at bedside. One person assist.   -LVAD workup.  Problem: Hemodynamic Status: Cardiac  Goal: Stable vital signs and fluid balance  Outcome: Progressing  Flowsheets (Taken 09/13/2022 0244)  Stable vital signs and fluid balance:   Assess signs and symptoms associated with cardiac rhythm changes   Monitor lab values     Problem: Inadequate Tissue Perfusion  Goal: Adequate tissue perfusion will be maintained  Outcome: Progressing  Flowsheets (Taken 09/13/2022 0244)  Adequate tissue perfusion will be maintained:   Monitor/assess lab values and report abnormal values   Monitor/assess for signs of VTE (edema of calf/thigh redness, pain)   Monitor/assess neurovascular status (pulses, capillary refill, pain, paresthesia, paralysis, presence of edema)     Problem: Ineffective Gas Exchange  Goal: Effective breathing pattern  Outcome: Progressing  Flowsheets (Taken 09/13/2022 0244)  Effective breathing pattern: Maintain CO2 level per LIP order

## 2022-09-14 DIAGNOSIS — I5023 Acute on chronic systolic (congestive) heart failure: Secondary | ICD-10-CM

## 2022-09-14 DIAGNOSIS — I5022 Chronic systolic (congestive) heart failure: Secondary | ICD-10-CM

## 2022-09-14 DIAGNOSIS — N189 Chronic kidney disease, unspecified: Secondary | ICD-10-CM

## 2022-09-14 LAB — CBC
Absolute NRBC: 0 10*3/uL (ref 0.00–0.00)
Hematocrit: 37.8 % (ref 37.6–49.6)
Hgb: 12.6 g/dL (ref 12.5–17.1)
MCH: 33.1 pg (ref 25.1–33.5)
MCHC: 33.3 g/dL (ref 31.5–35.8)
MCV: 99.2 fL — ABNORMAL HIGH (ref 78.0–96.0)
MPV: 12.3 fL (ref 8.9–12.5)
Nucleated RBC: 0 /100 WBC (ref 0.0–0.0)
Platelets: 139 10*3/uL — ABNORMAL LOW (ref 142–346)
RBC: 3.81 10*6/uL — ABNORMAL LOW (ref 4.20–5.90)
RDW: 15 % (ref 11–15)
WBC: 13.79 10*3/uL — ABNORMAL HIGH (ref 3.10–9.50)

## 2022-09-14 LAB — BASIC METABOLIC PANEL
Anion Gap: 14 (ref 5.0–15.0)
BUN: 75 mg/dL — ABNORMAL HIGH (ref 9.0–28.0)
CO2: 26 mEq/L (ref 17–29)
Calcium: 9.8 mg/dL (ref 7.9–10.2)
Chloride: 97 mEq/L — ABNORMAL LOW (ref 99–111)
Creatinine: 2.1 mg/dL — ABNORMAL HIGH (ref 0.5–1.5)
Glucose: 117 mg/dL — ABNORMAL HIGH (ref 70–100)
Potassium: 4.3 mEq/L (ref 3.5–5.3)
Sodium: 137 mEq/L (ref 135–145)
eGFR: 32.1 mL/min/{1.73_m2} — AB (ref >=60)

## 2022-09-14 LAB — GLUCOSE WHOLE BLOOD - POCT
Whole Blood Glucose POCT: 128 mg/dL — ABNORMAL HIGH (ref 70–100)
Whole Blood Glucose POCT: 289 mg/dL — ABNORMAL HIGH (ref 70–100)

## 2022-09-14 LAB — HEPATIC FUNCTION PANEL
ALT: 11 U/L (ref 0–55)
AST (SGOT): 14 U/L (ref 5–41)
Albumin/Globulin Ratio: 0.9 (ref 0.9–2.2)
Albumin: 3.4 g/dL — ABNORMAL LOW (ref 3.5–5.0)
Alkaline Phosphatase: 80 U/L (ref 37–117)
Bilirubin Direct: 0.6 mg/dL — ABNORMAL HIGH (ref 0.0–0.5)
Bilirubin Indirect: 1.1 mg/dL — ABNORMAL HIGH (ref 0.2–1.0)
Bilirubin, Total: 1.7 mg/dL — ABNORMAL HIGH (ref 0.2–1.2)
Globulin: 3.8 g/dL — ABNORMAL HIGH (ref 2.0–3.6)
Protein, Total: 7.2 g/dL (ref 6.0–8.3)

## 2022-09-14 LAB — MAGNESIUM: Magnesium: 2.3 mg/dL (ref 1.6–2.6)

## 2022-09-14 LAB — LACTIC ACID, PLASMA: Lactic Acid: 1.3 mmol/L (ref 0.2–2.0)

## 2022-09-14 MED ORDER — APIXABAN 5 MG PO TABS
5.0000 mg | ORAL_TABLET | Freq: Two times a day (BID) | ORAL | 2 refills | Status: DC
Start: 2022-09-14 — End: 2023-02-26

## 2022-09-14 MED ORDER — HYDRALAZINE HCL 25 MG PO TABS
25.0000 mg | ORAL_TABLET | Freq: Three times a day (TID) | ORAL | 2 refills | Status: DC
Start: 2022-09-14 — End: 2023-01-04

## 2022-09-14 MED ORDER — TORSEMIDE 20 MG PO TABS
40.0000 mg | ORAL_TABLET | Freq: Two times a day (BID) | ORAL | 3 refills | Status: DC
Start: 2022-09-14 — End: 2023-01-04

## 2022-09-14 MED ORDER — EMPAGLIFLOZIN 10 MG PO TABS
10.0000 mg | ORAL_TABLET | Freq: Every day | ORAL | Status: DC
Start: 2022-09-14 — End: 2022-09-14
  Administered 2022-09-14: 10 mg via ORAL
  Filled 2022-09-14: qty 1

## 2022-09-14 MED ORDER — ISOSORBIDE DINITRATE 10 MG PO TABS
10.0000 mg | ORAL_TABLET | Freq: Three times a day (TID) | ORAL | 2 refills | Status: DC
Start: 2022-09-14 — End: 2023-01-04

## 2022-09-14 NOTE — Discharge Instr - AVS First Page (Addendum)
Reason for your Hospital Admission:  Acute CHF Exacerbation      Instructions for after your discharge:  F/u with HF Clinic in 1 week.
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
## 2022-09-14 NOTE — Progress Notes (Addendum)
[place] [facility] PROGRESS NOTE    [facility] CTU4  [address]  [place] [address]  Loc: [phone]    Date Time: 09/14/22 8:45 AM  Patient Name: [facility] Day: 5    Subjective/Cardiac Relevant Events:   Breathing improved    Intake/Output Summary (Last 24 hours) at 09/14/2022 0845  Last data filed at 09/14/2022 0743  Gross per 24 hour   Intake 1550 ml   Output 2775 ml   Net -1225 ml       Weight this AM 186lbs, down from 189lbs yesterday.  DWG 184lbs as per RHC    ASSESSMENT:   Patient is a 76 y.o. male with the following relevant diagnoses:    Transferred from [facility] 22 Nov 23 for ADV HF evaluation  Acute on chronic HFrEF - NYHA IIIB/IV  On Torsemide 60mg  PO BID.   NT-proBNP this AM 2,051.  S/P RHC 09/10/22- normal PCWP @ weight of 184lbs  Hemodynamics: RA 7. RV 39/4. PA 41/21/29. PCW 14/18/14. Sats: RA 59%, PA 62%, AO 95%, Room Air. Fick CO/CI 4.0/2.0, TD CO/CI 3.5/1.7. HR 80, BP (cuff) 115/72/89.   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  CLL -on Acalabrutinib; [facility]  Moderate mitral and tricuspid valve regurgitation   Echo 21 Nov 23 - EF 21% (simpson's), marked LVE with LVIDD of 7cm, hypertrabeculated LV with severe global hypokinesis, grade II DD, mild AI, LAE with LAVI of 41.13mL/m2, mod MR, mild-mod TR, RVSP 4m.   S/P initial Primary prevention initial St Jude single chamber ICD implant in 2012 with RV ICD lead Fx, now S/P abandonment of RV ICD lead and implant of Medtronic BiVentricular ICD 23 Jan 18 with Dr. 25 Jan.  HIS CRT-D System is NOT MRI Conditional.  Review of last remote interrogation from October 2023, with NSVT events, but no sustained arrhythmias.  BiVp 94.3% with effective CRT of 93.1%.  EKG 23 Nov 23 demonstrates optimal CRT paced waveform. Optivol Over limit since mid Aug 2023.  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 03/2022.  BMI 25    RECOMMENDATIONS:     Defer mgmt to advanced heart failure team. LVAD workup ongoing.  Pts weights are stable on torsemide 40mg  po BID. Continue to track weights, HF education with salt and fluid restriction  Remains on GDMT for CMPY:  BB: metoprolol succinate 25mg  daily   ACE/ARB/ARNI on hold for renal dysfunction---on Hydralazine and Isordil in lieu of  MRA on hold for hyperkalemia earlier in admission  Continue aspirin 81 mg, atorvastatin 40 mg.  Remains on Eliquis for right lower extremity DVT diagnosed 02/2022  Will see as needed here; please contact our team to resee here; can follow up with Dr. as OP    Attestation:  I have seen the patient and performed the substantive portion of the visit with my personal exam as below.      Plan is as stated above; very much appreciate advanced heart failure service lead role in this patient's management and ongoing LVAD evaluation.    I have reviewed and updated the documented findings and plan of care accordingly.  Physical Exam  Constitutional: Cooperative, alert, no acute distress.  Neck: No carotid bruits, JVP 8-10 cm  Cardiac: Regular rate and rhythm, PMI diffuse  Pulmonary: Decreased breath sounds at bases  Extremities: no edema.  Vascular: +2 pulses in radial artery bilaterally, 2+ pedal pulses bilaterally.    Lelan Pons, MD      Telemetry/Labs Reviewed/Intake-Output:     Telemetry reviewed: SR, PVCs, brief run of NSVT          Recent Labs   Lab 09/14/22  0522   Bilirubin, Total 1.7*   Bilirubin Direct 0.6*   Protein, Total 7.2   Albumin 3.4*   ALT 11   AST (SGOT) 14       Recent Labs   Lab 09/14/22  0522   Magnesium 2.3       Recent Labs   Lab 09/10/22  0419   PT 16.9*   PT INR 1.5*   PTT 36       Recent Labs   Lab 09/14/22  0522 09/13/22  0239 09/12/22  0304   WBC  13.79* 15.49* 11.60*   Hgb 12.6 12.8 13.4   Hematocrit 37.8 38.6 39.6   Platelets 139* 154 160       Recent Labs   Lab 09/14/22  0522 09/13/22  0239 09/12/22  0304   Sodium 137 134* 135   Potassium 4.3 4.4 4.9   Chloride 97* 97* 96*   CO2 26 23 23    BUN 75.0* 78.0* 78.0*   Creatinine 2.1* 2.2* 2.4*   eGFR 32.1* 30.3* 27.3*   Glucose 117* 227* 162*   Calcium 9.8 9.6 10.0       Lab Results   Component Value Date    BNP 2,051 (H) 09/10/2022      Intake/Output Summary (Last 24 hours) at 09/14/2022 0845  Last data filed at 09/14/2022 0743  Gross per 24 hour   Intake 1550 ml   Output 2775 ml   Net -1225 ml       Medications:      Scheduled Meds: PRN Meds:    apixaban, 5 mg, Oral, Q12H [facility]  aspirin EC, 81 mg, Oral, Daily  atorvastatin, 40 mg, Oral, QHS  fluticasone furoate-vilanterol, 1 puff, Inhalation, QAM  hydrALAZINE, 25 mg, Oral, Q8H [facility]  insulin glargine, 18 Units, Subcutaneous, QHS  insulin lispro, 1-4 Units, Subcutaneous, QHS  insulin lispro, 1-8 Units, Subcutaneous, TID AC  insulin lispro, 6 Units, Subcutaneous, TID AC  isosorbide dinitrate, 10 mg, Oral, TID - Nitrate Free Interval  metoprolol succinate XL, 25 mg, Oral, Daily  acalabrutinib, 100 mg, Oral, Q12H [facility]  torsemide, 40 mg, Oral, BID        Continuous Infusions:   acetaminophen, 650 mg, Q6H PRN  albuterol-ipratropium, 3 mL, Q6H PRN  benzocaine-menthol, 1 lozenge, Q2H PRN  benzonatate, 100 mg, TID PRN  artificial tears (REFRESH PLUS), 1 drop, TID PRN  dextrose, 15 g of glucose, PRN   Or  dextrose, 12.5 g, PRN   Or  dextrose, 12.5 g, PRN   Or  glucagon (rDNA), 1 mg, PRN  melatonin, 3 mg, QHS PRN  naloxone, 0.2 mg, PRN  saline, 2 spray, Q4H PRN          Physical Exam:     Vitals:    09/13/22 2359 09/14/22 0100 09/14/22 0420 09/14/22 0732   BP: 105/54  103/58    Pulse: 86 (!) 161 83 94   Resp: 18  18  Temp: 98 F (36.7 C)  98 F (36.7 C)    TempSrc: Oral  Oral    SpO2: 95%  99%    Weight:   84.4 kg (186 lb)    Height:             09/09/2022     1:00 PM  09/10/2022     4:02 AM 09/11/2022     5:48 AM 09/12/2022     6:00 AM 09/12/2022     8:00 AM 09/13/2022     3:00 AM 09/14/2022     4:20 AM   Weight Monitoring   Height 182.9 cm    182.9 cm     Height Method Stated         Weight 83.87 kg 83.643 kg 85.231 kg 84.414 kg 84.414 kg 85.911 kg 84.369 kg   Weight Method Standing Scale Standing Scale Standing Scale Standing Scale      BMI (calculated) 25.1 kg/m2    25.3 kg/m2       Constitutional: Cooperative, alert, no acute distress.  Neck: No carotid bruits, JVP normal.  Cardiac: Regular rate and rhythm, normal S1 and S2; no S3 or S4, no murmurs, no rubs, no gallops.  Pulmonary: CTA B/L   Extremities: no edema. Warm and dry  Vascular: +2 pulses in radial artery bilaterally, 2+ pedal pulses bilaterally.    -------------------------------------------------------------------------------------  Signed by:       Dreama SaaMary C Tisch, PA        [facility]    [facility] Contact Information   [facility]  Secure Chat (Group):   [facility]    APP Spectralink:  [phone]    MD Spectralink :  [phone]  [phone]    After hours, non urgent consult line:  [phone]    After hours, physician on-call:  [phone] [facility]  Secure Chat (Group):   [place]    APP Spectralink:  [phone]    MD Spectralink :  [phone]      After hours, non urgent consult line:  [phone]    After hours, physician on-call:  [phone] [facility]  Secure Chat (Group):   [facility]    APP Spectralink:  [phone]    MD Spectralink :  [phone]      After hours, non urgent consult line:  [phone]    After hours, physician on-call:  [phone] [facility]  Secure Chat (Group):   [place] [facility]    APP Spectralink:  [phone]    MD Spectralink :  [phone]      After hours, non urgent consult line:  [phone]    After hours, physician on-call:  [phone]       This note was generated by the Dragon speech recognition and may contain  errors or omissions not intended by the user. Grammatical errors, random word insertions, deletions, pronoun errors, and incomplete sentences are occasional consequences of this technology due to software limitations. Not all errors are caught or corrected. If there are questions or concerns about the content of this note or information contained within the body of this dictation, they should be addressed directly with the author for clarification.

## 2022-09-14 NOTE — Discharge Summary (Addendum)
Discharge Summary    Date:09/14/2022   Patient Name: [name] [name]  Attending Physician: Orlie Pollen, MD    Date of Admission:   09/09/2022    Date of Discharge:   09/14/2022     Admitting Diagnosis:   Acute on chronic systolic heart failure     Discharge Dx:     Principal Diagnosis (Diagnosis after study, that is chiefly responsible for admission to inpatient status): Ischemic cardiomyopathy  Active Hospital Problems    Diagnosis POA    Principal Problem: Ischemic cardiomyopathy Yes      Resolved Hospital Problems   No resolved problems to display.       Treatment Team:   Treatment Team:   Attending Provider: Orlie Pollen, MD     Procedures performed:   Radiology: all results from this admission  [place] Noninvas Low Extrem Art Dopp/Press/Wavefrms (PVR) Comp 3-4 Levels    Result Date: 09/10/2022  Normal resting arterial study of the lower extremities. Darci Current, MD 09/10/2022 10:30 AM    US Carotid Duplex Dopp Comp Bilateral    Result Date: 09/10/2022  1. Less than 50% stenosis right internal carotid artery. 2. Less than 50% stenosis left internal carotid artery.  Darci Current, MD 09/10/2022 10:23 AM    US Abdomen Complete    Result Date: 09/10/2022   1. Multicystic kidneys bilaterally. 2. Minimal fullness of the left renal collecting system. 3. Otherwise unremarkable abdominal ultrasound. Janina Mayo, MD 09/10/2022 10:23 AM    CT Chest Abdomen Pelvis WO Contrast    Result Date: 09/08/2022  1. Significant interval decrease in size of previously seen abdominal and pelvic lymph nodes with small persistent mildly enlarged inguinal nodes and small iliac chain nodes. 2. Bibasilar opacities in the dependent portions of the lungs, nonspecific. Rocky Crafts, MD 09/08/2022 1:40 PM    Chest AP Portable    Result Date: 09/04/2022   No acute abnormality within the chest.  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  Patient had issues with low systolic blood pressures which limited the use of guidance directed medical therapy but did tolerate diuretics with weight loss.  It was unclear if patient still needed diuresis and was sent to [facility] for right heart cath to evaluate fluid status and restratification.       Condition at Discharge:   1.  Acute on chronic systolic heart failure, most recent ejection fraction 21% with negative cardiac catheterization in the past, status post ICD whose course has been complicated by significant hypotension limiting the use of goal-directed medical therapy.   LVAD eval as outpatient.  Metolazone and Valsartan have been stopped.  Isordil and Hydralazine started.    2.  CKD stage IIIb  - Cr 2.1     3.  Stage IV CLL on  CALQUENCE( acalabrutinib) [place] [facility], patient seen by Dr. Laurine Blazer on 11/22.    CT chest abdomen pelvis shows significant decrease in lymphadenopathy showing that the chemotherapy is working.    Dr. Cam Hai recommends continuing this medication.       4.  Insulin-dependent diabetes  Jardiance started for GDMP for CHF.  Janumet stopped.  Continue home insulin.       Today:     BP 106/64   Pulse 88   Temp 97.5 F (36.4 C) (Oral)   Resp 18   Ht 1.829 m (6' 0.01")   Wt 84.4 kg (186 lb)   SpO2 98%   BMI 25.22 kg/m   Ranges for the last 24 hours:  Temp:   [97.5 F (36.4 C)-98 F (36.7 C)] 97.5 F (36.4 C)  Heart Rate:  [82-161] 88  Resp Rate:  [18] 18  BP: (99-119)/(54-74) 106/64    Last set of labs   Recent Labs   Lab 09/14/22  0522   WBC 13.79*   Hgb 12.6   Hematocrit 37.8   Platelets 139*     Recent Labs   Lab 09/14/22  0522   Sodium 137   Potassium 4.3   Chloride 97*   CO2 26   BUN 75.0*   Creatinine 2.1*   eGFR 32.1*   Glucose 117*   Calcium 9.8     Recent Labs   Lab 09/14/22  0522   Bilirubin, Total 1.7*   Bilirubin Direct 0.6*   Protein, Total 7.2   Albumin 3.4*   ALT 11   AST (SGOT) 14       Micro / Labs / Path pending:     Unresulted Labs       Procedure . . . Date/Time    Urine Nicotine and Cotinine [[id number]] Collected: 09/10/22 1454     Updated: 09/10/22 1515            Discharge Instructions For Providers     LVAD eval as outpatient.      Heart Failure:  Reason(s) for not prescribing an angiotension converting enzyme inhibitor (ACEI) at discharge: Hypotension and Worsening Renal Function  Reason(s) for not prescribing an angiotensin receptor blocker (ARB) at discharge: Hypotension and Worsening Renal Failure       Discharge Instructions:      Follow-up Information       Marianna Fuss, MD Follow up in 1 week(s).    Specialties: Advanced Heart Failure and Transplant Cardiology, Cardiology  Contact information:  [address]  [place] [address]  [phone]               LVAD Education. Go on 09/25/2022.    Why: Your appointment is on September 25, 2022 at 11:30 AM in the transplant clinic which is located on the ground floor of the heart and vascular insititute  Contact information:  [facility]  Transplant Clinic - Ground Floor of Heart and Vascular Instituite  [address]  [place], [place] [address]                           Discharge Diet: Cardiac Consistent Carbohydrates  Puncture Site 09/09/22 Brachial Anterior;Distal;Right Arm (Active)   Site Assessment Dry;Intact;Clean 09/14/22 0800   Dressing Status Clean;Dry;Intact  09/14/22 0800   Drainage Amount None 09/14/22 0800   Pressure device present? No 09/14/22 0800   Number of days: 4  Discharge References/Attachments    None       Disposition:  Short Term Hospital     Discharge Medication List        Taking      apixaban 5 MG  Dose: 5 mg  What changed: how much to take  Commonly known as: ELIQUIS  Take 1 tablet (5 mg) by mouth every 12 (twelve) hours For 1 week followed by 1 tablet twice daily     aspirin EC 81 MG EC tablet  Dose: 81 mg  Take 1 tablet (81 mg) by mouth daily     atorvastatin 40 MG tablet  Dose: 40 mg  Commonly known as: LIPITOR  Take 1 tablet (40 mg total) by mouth nightly     hydrALAZINE 25 MG tablet  Dose: 25 mg  Commonly known as: APRESOLINE  Take 1 tablet (25 mg) by mouth every 8 (eight) hours     isosorbide dinitrate 10 MG tablet  Dose: 10 mg  Commonly known as: ISORDIL  Take 1 tablet (10 mg) by mouth 3 (three) times daily     Jardiance 10 MG tablet  Dose: 10 mg  Generic drug: empagliflozin  TAKE 1 TABLET(10 MG) BY MOUTH DAILY     loratadine 10 MG tablet  Dose: 10 mg  Commonly known as: CLARITIN  Take 1 tablet (10 mg) by mouth daily     metoprolol succinate XL 25 MG 24 hr tablet  Dose: 12.5 mg  Commonly known as: TOPROL-XL  Take 0.5 tablets (12.5 mg) by mouth daily     niacin 500 MG CR tablet  What changed:   how much to take  how to take this  when to take this  additional instructions  Commonly known as: NIASPAN  TAKE 1 TABLET BY MOUTH TWICE DAILY     Semglee (yfgn) 100 UNIT/ML Sopn  Generic drug: Insulin Glargine-yfgn  ADMINISTER 50 UNITS UNDER THE SKIN AT BEDTIME     tablet Calquence 100 MG Tabs  Generic drug: acalabrutinib Maleate     torsemide 20 MG tablet  Dose: 40 mg  What changed: how much to take  Commonly known as: DEMADEX  Take 2 tablets (40 mg) by mouth 2 (two) times daily            STOP taking these medications      Janumet 50-500 MG tablet  Generic drug: SITagliptin-metFORMIN     metOLazone 10 MG tablet  Commonly known as: ZAROXOLYN      valsartan 40 MG tablet  Commonly known as: DIOVAN            Minutes spent coordinating discharge and reviewing discharge plan: 90 minutes      Signed by: Orlie Pollen, MD

## 2022-09-14 NOTE — Nursing Progress Note (Signed)
Pt name/Code: [name] [name] (76 y.o. male);Full Code  Admit Date/Dx: 09/09/2022 Ischemic cardiomyopathy  Weights: Last 3 Weights for the past 72 hrs (Last 3 readings):   Weight   09/14/22 0420 84.4 kg (186 lb)   09/13/22 0300 85.9 kg (189 lb 6.4 oz)   09/12/22 0800 84.4 kg (186 lb 1.6 oz)       Shift comment:  Assumed care @ 0100, VSS with the exception of frequent ectopy/PVCs. Although pt sleeping comfortably, all safety precautions in place,  personal belongings within reach. Hourly rounding occurred.     Plan:   - possible RHC, pending AM lactic acid (see results below)     Lab Results   Component Value Date    LACTATE 1.3 09/14/2022

## 2022-09-14 NOTE — Progress Notes (Signed)
Advanced Heart Failure and Transplant Progress Note    Epic Chat Group: '[facility] Advanced Heart Failure'    Primary Cardiologist: Marianna Fuss, MD   CV Surgeon: N/A  Referring Physician: Greggory Brandy, MD     Listed for transplant: N/A    Assessment:   76 y.o M with PMH CLL (on Calq) HLD, DM, recent RLE DVT, CKD/AKI, hx CLL, dilated NICM, severe LV dysfunction, admit recurrent acute-on-chronic systolic HF, with Lactate elevation per OSH labs concerning for possible CS, referred for RHC and advanced HF therapy evaluation.     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  If it keeps rising than worried more about a failure of oral therapy.  Tolerating vasodilators  Increased WBC    Recommendations:     Heart Failure Medical Therapy  BB: Metoprolol Succinate 25 mg daily  ACE/ARB/ARNI: Hold d/t AKI  SGLT2i: Resume Empagliflozin 10 mg QD  MRA: Hold d/t AKI  Diuresis: Torsemide 40 mg BID   Afterload Reduction: Hydralazine 25 mg TID, Isosorbide Dinitrate 10 mg TID  Cardiac Device (ICD/ Cardiomems): Medtronic ICD  Home Regimen: Metoprolol 12.5mg , Torsemide 60mg  BID, Metolazone 10mg  daily (will not resume upon d/c), Valsartan 20mg  daily (will not resume upon d/c), Jardiance 10mg     NICM, EF 28%  Hemodynamics: RA 7. RV 39/4. PA 41/21/29. PCW 14/18/14. Sats: RA 59%, PA 62%, AO 95%, Room Air. Fick CO/CI 4.0/2.0, TD CO/CI 3.5/1.7. HR 80, BP (cuff) 115/72/89.  BP hold parameter to hold for SBP<88  Advanced Therapies Evaluation: Patient under going LVAD evaluation, he is not a candidate for transplant due to CLL and advanced age.     AKI on CKD3a (baseline Cr 2.0, at baseline)  Avoid nephrotoxic medications  If  worsening, this limits restarting full GDMT     Lactic acidosis  Lactic acid elevated to 2.5 resolveing  Continue to trend tomorrow, if rising, consider repeat RHC on Monday    T2DM, insulin-dependent  RLE DVT (02/2022)  Continue Eliquis    Follow-Up  Dr Luella Cook ([facility]) 09/18/22 at 10:30 AM  LVAD evaluation 09/25/22 at 11:30 AM    I personally saw and examined the patient and spent 30 minutes in counseling, education, and care. The patient was also seen by Dr. Kyung Rudd. The assessment and plan were discussed and formulated together.     Amika McBurnie, PA-C  Advanced Heart Failure/ VAD/ Cardiac Transplant Team  [phone]    I performed the substantive portion of this visit by providing more than 50% of the total time. I personally saw and examined the patient and spent 33 minutes. The patient was also seen by the APP Ms. McBurnie. Total time spent rounding with the patient together was 20 minutes. I reviewed the APP note and updated the documented findings and plan of care accordingly.    Appears euvolemic on exam, tolerating oral regimen.  Intolerance of GDMT due to hypotension and renal dysfunction is a poor prognostic indicator.  Discussed with Dr. Webb Silversmith, follow up with him later this week and in transplant clinic later this month for ongoing discussion of LVAD.  Will need to understand CLL prognosis and potential treatments  as we consider LVAD.        Alba Destine MD  Advanced Heart Failure and Transplant Cardiology        Subjective/24 hour events:   -No acute events overnight  -Patient reports he symptomatically feels better since admission, ambulated without dyspnea over the weekend.     Objective:   Vital Signs in last 24 hours:  Temp:  [97.5 F (36.4 C)-98 F (36.7 C)] 97.5 F (36.4 C)  Heart Rate:  [82-161] 84  Resp Rate:  [18] 18  BP: (99-119)/(54-74) 99/57  SpO2: 98 %  O2 Device: None (Room air)      Telemetry: NSR    Hemodynamics:    Intake / Output:    Intake/Output Summary (Last  24 hours) at 09/14/2022 1317  Last data filed at 09/14/2022 1135  Gross per 24 hour   Intake 240 ml   Output 2400 ml   Net -2160 ml          Weight:      09/12/2022     8:00 AM 09/13/2022     3:00 AM 09/14/2022     4:20 AM   Weight Monitoring   Height 182.9 cm     Weight 84.414 kg 85.911 kg 84.369 kg   BMI (calculated) 25.3 kg/m2          Weight change: -0.045 kg (-1.6 oz)       Physical Exam:   General: well-developed, alert, NAD  HEENT: conjunctiva pale, buccal mucosa moist, anicteric sclera, no erythema, JVP <10cm H2O  Lungs: Diminished breath sounds bilaterally, no rhonchi, wheezes or crackles with normal rate and effort  Cardiovascular: S1, S2, normal rate, regular rhythm, no m/r/g   Abdominal: soft, NT/ND, +B.S., no HSM  Extremities: warm to touch, no cyanosis, no edema, peripheral pulses present bilaterally  Neuromuscular exam: alert and oriented x 3, speech fluent   Skin: warm     LDAs:  Patient Lines/Drains/Airways Status       Active Lines, Drains and Airways       Name Placement date Placement time Site Days    Peripheral IV 09/10/22 22 G Standard Posterior;Right Hand 09/10/22  0400  Hand  4                      Current Meds:  apixaban, 5 mg, Oral, Q12H SCH  aspirin EC, 81 mg, Oral, Daily  atorvastatin, 40 mg, Oral, QHS  empagliflozin, 10 mg, Oral, Daily  fluticasone furoate-vilanterol, 1 puff, Inhalation, QAM  hydrALAZINE, 25 mg, Oral, Q8H SCH  insulin glargine, 18 Units, Subcutaneous, QHS  insulin lispro, 1-4 Units, Subcutaneous, QHS  insulin lispro, 1-8 Units, Subcutaneous, TID AC  insulin lispro, 6 Units, Subcutaneous, TID AC  isosorbide dinitrate, 10 mg, Oral, TID - Nitrate Free Interval  metoprolol succinate XL, 25 mg, Oral, Daily  acalabrutinib, 100 mg, Oral, Q12H SCH  torsemide, 40 mg, Oral, BID         Drips:        Labs:  CBC        09/14/22  0522 09/13/22  0239 09/12/22  0304   WBC 13.79* 15.49* 11.60*   Hgb 12.6 12.8 13.4   Hematocrit 37.8 38.6 39.6   Platelets 139* 154 160       BMP         09/14/22  0522 09/13/22  0239 09/12/22  0304 09/11/22  1659 09/10/22  0724 09/10/22  0419   BUN 75.0* 78.0*  78.0* 75.0*   < > 59.0*   Creatinine 2.1* 2.2* 2.4* 2.6*   < > 2.2*   eGFR 32.1* 30.3* 27.3* 24.8*   < > 30.3*   Sodium 137 134* 135 132*   < > 137   Potassium 4.3 4.4 4.9 4.6   < > 4.2   Chloride 97* 97* 96* 93*   < > 94*   CO2 26 23 23 25    < > 27   Calcium 9.8 9.6 10.0 9.6   < > 9.8   Magnesium 2.3  --   --   --   --  2.6   Lactic Acid 1.3  --  1.7 1.8   < > 2.3*   NT-proBNP  --   --   --   --   --  2,051*   LDH  --   --   --   --   --  327    < > = values in this interval not displayed.       LFTs        09/14/22  0522 09/13/22  0239 09/12/22  0304   AST (SGOT) 14 12 12    ALT 11 10 9    Alkaline Phosphatase 80 94 87   Bilirubin, Total 1.7* 1.1 2.1*   Bilirubin Direct 0.6* 0.5 0.8*   Bilirubin Indirect 1.1* 0.6 1.3*       Coags        09/10/22  0419   PT 16.9*   PT INR 1.5*   PTT 36       ABGs          Microbiology  Microbiology Results (last 15 days)       Procedure Component Value Units Date/Time    COVID-19 (SARS-CoV-2) and Influenza A/B & RSV (Cepheid)- Age less than 5 years [[id number]] Collected: 09/04/22 1225    Order Status: Completed Specimen: Nasopharyngeal Swab Updated: 09/04/22 1325     Purpose of COVID testing Diagnostic -PUI     SARS-CoV-2 Specimen Source Nasal Swab     SARS CoV 2 Overall Result Negative     Comment: Test performed using the Cepheid Xpert Xpress SARS-CoV-2 assay.  This assay is only for use under the Food and Drug  Administration's Emergency Use Authorization. This is a  real-time RT-PCR assay for the qualitative detection of  SARS-CoV-2 (COVID-19) RNA. Viral nucleic acids may persist in  vivo, independent of viability. Detection of viral nucleic acid  does not imply the presence of infectious virus, or that virus  nucleic acid is the cause of clinical symptoms.  Test performance has not been established for immunocompromised  patients or patients without signs and symptoms of  respiratory  infection. Negative results do not preclude SARS-CoV-2infection  and should not be used as the sole basis for patient  management decisions.  Invalid results may be due to inhibiting  substances in the specimen and recollection should occur.    Please see Fact Sheets for patients and providers located at:  http://www.rice.biz/          Influenza A Negative     Influenza B Negative     Respiratory Syncytial Virus Negative     Comment: Test performed using the Cepheid assay.  Please see Fact Sheets for patients and providers located at:  http://www.rice.biz/    Test performed using the Cepheid Xpert Xpress SARS-CoV-2/Flu/RSV  assay. This assay is only for use under the Food and Drug  Administration's Emergency Use  Authorization.  This is a multiplexed  real-time RT-PCR test intended for the simultaneous qualitative  detection and differentiation of SARS-CoV-2, influenza A, influenza B,  and respiratory syncytial virus (RSV) viral RNA.  Viral nucleic acids  may persist in vivo, independent of viability. Detection of viral  nucleic acid does not imply the presence of infectious virus, or that  virus nucleic acid is the cause of clinical symptoms. If only one viral  target is positive but coinfection with multiple targets is suspected,  the sample should be re-tested with another FDA cleared, approved, or  authorized test, if coinfection would change clinical management.  Negative results do not preclude SARS-CoV-2, influenza A virus,  influenza B virus and/or RSV infection and should not be used as the  sole basis for treatment or other patient management decisions.  Invalid results may be due to inhibiting substances in the specimen  and recollection should occur.         Narrative:      o Collect and clearly label specimen type:  o PREFERRED-Upper respiratory specimen: One Nasal Swab in  Transport Media.  o Hand deliver to laboratory ASAP  Diagnostic -PUI               Cardiac Studies:  Echocardiogram:   09/07/2022  Summary    * Markedly dilated left ventricle with severely reduced systolic function  (LVEDVi 109 mL/m2, ejection fraction 21%).    * Hypertrabeculated left ventricle with severe global hypokinesis.    * Mild aortic valve and moderate mitral valve regurgitation (details below).    * Wire in right-sided chambers with mild to moderate (2+) peri-device  tricuspid valve regurgitation.    * Dilated, poorly responsive IVC.    * When visually compared with the prior study dated 05/29/2021, LV function  is less vigorous (previous EF 31%, now 21%; previous LVOT VTI 17 cm, now 11  cm); valvular regurgitation is less prominent.    Right Heart Catheterization:  RA 7. RV 39/4. PA 41/21/29. PCW 14/18/14. Sats: RA 59%, PA 62%, AO 95%, Room Air. Fick CO/CI 4.0/2.0, TD CO/CI 3.5/1.7. HR 80, BP (cuff) 115/72/89.     Imaging:  Radiology Results (24 Hour)       ** No results found for the last 24 hours. **

## 2022-09-14 NOTE — Progress Notes (Signed)
09/14/22 1648   Discharge Disposition   Patient preference/choice provided? Yes   Physical Discharge Disposition Home  (NN)   Mode of Transportation Car  (wife)   Patient/Family/POA notified of transfer plan Yes   Patient agreeable to discharge plan/expected d/c date? Yes   Bedside nurse notified of transport plan? Yes   CM Interventions   Follow up appointment scheduled?   (See AVS- DEC 1 Advanced Heart Failure Visit 10:30 AM  Arrive by 10:15 AM)   Is this a Medicare focused or COVID patient? No   Referral made for home health RN visit? Does not meet home bound criteria   Multidisciplinary rounds/family meeting before d/c? Yes   Medicare Checklist   Is this a Medicare patient? No       Khup Sapia Cornelious Bryant, BSN, RN  RN Case Production designer, theatre/television/film I  Care Management Department  [facility]  [address] [place], [place] [address]  Phone: [phone]  [email or web address]

## 2022-09-14 NOTE — Progress Notes (Signed)
Initial Case Management Assessment and Discharge Planning  [facility]   Patient Name: [name] [name], [name] [name]   Date of Birth [birth year]   Attending Physician: Orlie Pollen, MD   Primary Care Physician: Zorita Pang, MD   Length of Stay 5   Reason for Consult / Chief Complaint See below:        Situation   Admission DX:   1. Ischemic cardiomyopathy    2. Chronic combined systolic and diastolic heart failure      A/O Status: X 4  Patient admitted from: transfer from Jean[facility] Flattery  Admission Status: inpatient  Health Care Agent: Self   Background     Advanced directive: Not Received  Code Status: Full Code   Residence: One story home- Lives w/ wife, 1 level, 1 STE, per pt can drive short distance, wife provides transport needs. Per pt no ambulation concerns. Address on file confirmed.   PCP: Zorita Pang, MD- Confirmed  Patient Contact:   [phone] (home)     [phone] (mobile)     Emergency contact:   Extended Emergency Contact Information  Primary Emergency Contact: [facility]  Address: [address]           [place], [place] [address] [place]  Mobile Phone: [phone]  Relation: Spouse   ADL/IADL's: Independent  Previous Level of function: 7 Independent   DME: None  Insurance on file confirmed.  Pharmacy:     [facility] [address], [place] - [address] AT [facility] [address]  [address]  [place] [address]  Phone: [phone] Fax: [phone]    [facility]  [address]  [place] [address]  Phone: [phone] Fax: [phone]    Prescription Coverage: Yes  Home Health: The patient is not currently receiving home health services.  Previous SNF/AR: prior admission to [facility] 13 years ago  UAI on file?: No  Transport for discharge? Mode of transportation: Sales executive - Family/Friend to drive patient-w ife  Agreeable to Home with family post-discharge:  Yes     Assessment   Assessment completed via  phone w/ pt. Pt is on chemo precautions- pregnant team members not to enter pt room.    BARRIERS TO DISCHARGE: pending medical clearance- LVAD w/u, cont gdmt, trend lactic, poss RHC     Recommendation   D/C Plan A: Home with family    D/C Plan B: Home with family and Home with home health             Melitza Metheny Cornelious Bryant, BSN, RN  RN Case Manager I  Care Management Department  [facility]  [address] [place], [place] [address]  Phone: [phone]  [email or web address]

## 2022-09-14 NOTE — Plan of Care (Signed)
Problem: Moderate/High Fall Risk Score >5  Goal: Patient will remain free of falls  Outcome: Completed     Problem: Hemodynamic Status: Cardiac  Goal: Stable vital signs and fluid balance  Outcome: Completed     Problem: Inadequate Tissue Perfusion  Goal: Adequate tissue perfusion will be maintained  Outcome: Completed     Problem: Ineffective Gas Exchange  Goal: Effective breathing pattern  Outcome: Completed     Problem: Day of Admission - Heart Failure  Goal: Heart Failure Admission  Outcome: Completed     Problem: Everyday - Heart Failure  Goal: Stable Vital Signs and Fluid Balance  Outcome: Completed  Goal: Mobility/Activity is Maintained at Optimal Level for Patient  Outcome: Completed  Goal: Nutritional Intake is Adequate  Outcome: Completed  Goal: Teaching-Using CHF Warning Zones and Educational Videos  Outcome: Completed     Problem: Day of Discharge - Heart Failure  Goal: Discharge Education  Outcome: Completed     Problem: Compromised Tissue integrity  Goal: Damaged tissue is healing and protected  Outcome: Completed  Goal: Nutritional status is improving  Outcome: Completed     Problem: Pain interferes with ability to perform ADL  Goal: Pain at adequate level as identified by patient  Outcome: Completed     Problem: Side Effects from Pain Analgesia  Goal: Patient will experience minimal side effects of analgesic therapy  Outcome: Completed

## 2022-09-14 NOTE — Progress Notes (Signed)
Pt is alert and oriented x4, follows commands. On RA. No c/o pain and discomfort. Voided well. Fall and safety precaution maintained.   Report given to [employer].

## 2022-09-15 ENCOUNTER — Telehealth (INDEPENDENT_AMBULATORY_CARE_PROVIDER_SITE_OTHER): Payer: Self-pay

## 2022-09-15 ENCOUNTER — Encounter: Payer: Self-pay | Admitting: Cardiovascular Disease

## 2022-09-15 NOTE — Telephone Encounter (Signed)
Hospital D/C Template    Chart Review:     Type of Encounter : Inpatient  Facility: [facility]  Discharge Date: 09/14/22  Primary Discharge Dx: Ischemic cardiomyopathy  #  Acute on chronic systolic heart failure, most recent ejection fraction 21% with negative cardiac catheterization in the past, status post ICD whose course has been complicated by significant hypotension limiting the use of goal-directed medical therapy. -LVAD eval as outpatient.  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 Hx- worsening sx of HF- sob/doe/fatigue/ weight gain, leg edema-  despite increased oral diuretic dose. Seen in Card office and sent to ED for eval, diuresis. Initially at [facility], transferred to [facility]  What type of teaching did you receive regarding your symptoms/diagnosis? Heart failure, medicine changes made  What symptoms do you have now, if any? Pt states he is doing okay at home - no chest pain, still with some ongoing DOE, but improved. Pt states leg edema also decreased, states he has a scale for home daily weights but was just waking up today. States he is resting at home and was advised to take it easy at home - no heavy exertion/activity for now. No c/o dizziness, and states able to get up and around at [facility] for self. Reports he does not have a BP cuff at home but plans to get one.   Tell me about your current medications and any changes the hospital made: [place] valsartan, metolazone. Decrease torsemide dose to 40 mg bid/ resume other routine meds per AVS  If new medication prescribed: isordil, hydralazine  Tell me about the new medication and how you are to take it? Reviewed new meds - name,  dose during call- tid dosing  What difficulty did you have, if any, picking up your medication(s)? none  If the new medication is an injection: continues on glargine insulin, hs, no dose change    What appointments have you made for follow-up? Card- Adv HF and scheduled for PCP [facility] 12/12 during call ( pt unable to schedule appt sooner due to other medical appt already scheduled other days)  What transportation options do you have to make it to your appointments? Car, wife drives , pt not currently driving  What assistance, if any, are you in need of while continuing your care at home? Indep with mobility and self care at home, lives at home with wife      "Thank you for taking my call today! If there is anything that you need, please give your office a call at [facility], [phone]."

## 2022-09-16 LAB — URINE NICOTINE AND COTININE
URINE NICOTINE: <2 ng/mL
Urine Cotinine: <2 ng/mL

## 2022-09-18 ENCOUNTER — Ambulatory Visit (INDEPENDENT_AMBULATORY_CARE_PROVIDER_SITE_OTHER): Payer: Commercial Managed Care - POS | Admitting: Cardiovascular Disease

## 2022-09-18 ENCOUNTER — Encounter (INDEPENDENT_AMBULATORY_CARE_PROVIDER_SITE_OTHER): Payer: Self-pay | Admitting: Cardiovascular Disease

## 2022-09-18 DIAGNOSIS — I428 Other cardiomyopathies: Secondary | ICD-10-CM

## 2022-09-18 DIAGNOSIS — I5023 Acute on chronic systolic (congestive) heart failure: Secondary | ICD-10-CM

## 2022-09-18 NOTE — Progress Notes (Signed)
AMBULATORY CARE MANAGEMENT Outreach Call    Hospital patient admitted to: [facility] Admission/Discharge Dates:   11/22 to 09/14/2022     Reason for admission to hospital: Non-ischemic cardiomyopathy      Name: [name] [name]    ### Patient Details  Date of Birth: 29-Jan-1946  MRN: [id number]    ### Encounter Details  Arrival Date: 09/09/2022 11:48 AM EST  Discharge Date: 09/14/2022 06:00 PM EST  Encounter ID: [id number] [id number]/27/2023   6:00:00PM    ### Related interaction  [facility] TCM Post Discharge Outreach (Post Discharge TCM Outreach 1) ([email or web address])    ### Required Interventions and Feedback     Call Status         Call Status:     Attempt 3 (edited by DN on 09/18/2022 04:01 PM EST)    Voicemail Left:     Yes (edited by DN on 09/18/2022 04:01 PM EST)     Additional Questions         Comments:     Writer contacted patient at  [phone] for hospital follow up. Unable to the patient and left a voicemail. (edited by DN on 09/18/2022 04:03 PM EST)    Arma Heading, ACM-SW  Ambulatory Case Manager  [facility]  [address]  [place], [place] [address]  [place] [phone]  [email or web address]

## 2022-09-18 NOTE — Progress Notes (Signed)
[facility] ADVANCED HE[facility] FAILURE OFFICE PROGRESS NOTE    [facility]  [facility]  [address]  [place] [id number]  Dept: [phone]  Dept Fax: [phone]       Patient Name: [name] MichaelisWILLIAMS,Ryheem EDWARD    Date of Visit:  September 18, 2022  Date of Birth: 10/07/1946  AGE: 76 y.o.  Medical Record #: [id number]      CHIEF COMPLAINT:    Chief Complaint   Patient presents with    Follow-up    Non-ischemic cardiomyopathy    Chronic systolic congestive heart failure       HISTORY OF PRESENT ILLNESS:    I had the pleasure of seeing Mr. [name] MichaelisBruce Edward Schillaci today in follow up in the [facility]. He is a pleasant 76 y.o. male who has history of chronic systolic HF and nonischemic cardiomyopathy with EF 25-30% by echo in August 2022 and EF 21 % by echo in November 2023 when he was admitted for symptoms related to HF.      [facility] then [facility] where a LVAD evaluation was initiated.  He was in the hospitals 11/17-11/27/2023.  He was admitted from our clinic with worsening dyspnea, hypotension, and poor response to diuretics.  As the course of events developed, he reported not taking diuretics regularly along with dietary indiscretions.      His weight at admission was 213 pounds.  He left the hospital at 185 pounds and continues to weight 185 pounds on his home scale.    For HF GDMT, he was previously on carvedilol 25 mg twice daily, Entresto 97/23 mg twice daily, Jardiance 10 mg daily, and spironolactone 12.5 mg daily.     Since leaving the hospital, GDMT is metoprolol succinate 25mg  daily, isordil 10mg  TID, hydralazine 25mg  TID, and jardiance 10mg  daily.  Today's SBP is 92.    Current diuretic torsemide 40mg  twice daily.    He has a Medtronic ICD in place.    LVAD evaluation is underway.  The next scheduled visit with the [facility] team is December 8.    Dr. Laurine Blazerar is his  oncologist for management of CLL.    From our previous clinic note:  He was initially seen by urology couple weeks prior for severe scrotal edema.  He also mentioned that he had lower extremity edema as well.  [facility] for worsening swelling and shortness of breath. Chest x-ray showed no acute cardiopulmonary process.  He had an ultrasound of the scrotum and testicles, showed moderate size, left high steel scrotal wall edema.  After IV diuresis, he noticed improvement in his breathing and LE edema. 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 Per [facility] Laurine [facility]ar [facility] there was discussion regarding treatment with BTK inhibitors or venetoclax. Seen by urology, who did not recommend surgery at this time.  He was also found to have right lower extremity DVT now on Eliquis for anticoagulation.   During his hospital course, spironolactone was discontinued for hyperkalemia. He also briefly required pressors which felt to be related to hypovolemia. He was restarted on low dose Valsartan prior to discharge.   At follow-up today, reports stable symptoms since discharge.  Average blood pressures are now in the systolic 110s to 295M120s.  Denies any worsening shortness of breath, orthopnea, PND, or edema.  On his home scale, he is weighing  around 190 pounds.  He saw Dr. Laurine Blazer and was started on a new medication (name unknown).  He has been taking this medication for 1 week.    PAST MEDICAL HISTORY: He has a past medical history of Acute CHF, Acute systolic (congestive) heart failure (08/2017), Anxiety, CAD (coronary artery disease), Cardiomyopathy, CHF (congestive heart failure) (08/12/2014, 2013), Chronic obstructive pulmonary disease, Coronary artery disease (2011), Depression, echocardiogram (02/2016, 11/2016, 09/2017, 12/20/2017), GERD (gastroesophageal reflux disease), Heart attack (2011), Hyperlipemia, Hypertension, ICD (implantable cardioverter-defibrillator) in  place, Ischemic cardiomyopathy, Nonischemic cardiomyopathy, NSTEMI (non-ST elevated myocardial infarction), 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 He has a past surgical history that includes duodenal ulcer (1973); orthopedic surgery (AGE 46); EGD (N/A, 08/15/2014); Circumcision (AGE 51); Fracture surgery (AGE 80); Hernia repair (AGE 51); Colonoscopy (2009); Tonsillectomy and adenoidectomy (1954); Cardiac defibrillator placement (12/2012); Cardiac pacemaker placement (2014); EGD (1975); EGD, COLONOSCOPY (N/A, 04/04/2018); ECHOCARDIOGRAM, TRANSTHORACIC (02/2016 11/2016 09/2017 12/2017); MPI nuclear study (06/2016); Correction hammer toe; BIV (11/10/2016); ECHOCARDIOGRAM, TRANSTHORACIC (02/2016,11/2016,12/20/2017); Cardiac catheterization (03/2010); Cardiac catheterization (12/2012); and Right Heart Cath (Right, 09/09/2022).    ALLERGIES:   Allergies   Allergen Reactions    Allopurinol        MEDICATIONS:   Current Outpatient Medications:     apixaban (ELIQUIS) 5 MG, Take 1 tablet (5 mg) by mouth every 12 (twelve) hours For 1 week followed by 1 tablet twice daily, Disp: 90 tablet, Rfl: 2    aspirin EC 81 MG EC tablet, Take 1 tablet (81 mg) by mouth daily, Disp: , Rfl:     atorvastatin (LIPITOR) 40 MG tablet, Take 1 tablet (40 mg total) by mouth nightly, Disp: 90 tablet, Rfl: 0    Calquence 100 MG Tab, , Disp: , Rfl:     hydrALAZINE (APRESOLINE) 25 MG tablet, Take 1 tablet (25 mg) by mouth every 8 (eight) hours, Disp: 90 tablet, Rfl: 2    isosorbide dinitrate (ISORDIL) 10 MG tablet, Take 1 tablet (10 mg) by mouth 3 (three) times daily, Disp: 90 tablet, Rfl: 2    Jardiance 10 MG tablet, TAKE 1 TABLET(10 MG) BY MOUTH DAILY, Disp: 90 tablet, Rfl: 3    loratadine (CLARITIN) 10 MG tablet, Take 1 tablet (10 mg) by mouth daily, Disp: 5 tablet, Rfl: 0    metoprolol succinate XL  (TOPROL-XL) 25 MG 24 hr tablet, Take 0.5 tablets (12.5 mg) by mouth daily, Disp: 45 tablet, Rfl: 3    niacin (NIASPAN) 500 MG CR tablet, TAKE 1 TABLET BY MOUTH TWICE DAILY (Patient taking differently: 250 mg Taking half a tablet), Disp: 180 tablet, Rfl: 3    Semglee, yfgn, 100 UNIT/ML Solution Pen-injector, ADMINISTER 50 UNITS UNDER THE SKIN AT BEDTIME, Disp: 45 mL, Rfl: 3    torsemide (DEMADEX) 20 MG tablet, Take 2 tablets (40 mg) by mouth 2 (two) times daily, Disp: 540 tablet, Rfl: 3     FAMILY HISTORY: family history includes Breast cancer in his mother; Coronary artery disease in his mother; Diabetes in his brother and mother; Heart attack in his father; Heart attack (age of onset: 9) in his mother; Hypertension in his father and mother.    SOCIAL HISTORY: He reports that he has never smoked. He has never used smokeless tobacco. He reports that he does not currently use alcohol. He reports that he does not use drugs.      PHYSICAL EXAMINATION    Visit Vitals  Blood Pressure 92/60 (BP Site: Left arm, Patient Position: Sitting, Cuff Size: Medium)   Pulse 82   Height 1.829 m (6')   Weight 86.2 kg (190 lb)   Oxygen Saturation 98%   Body Mass Index 25.77 kg/m         Last 3 Weights:      Row Labels 09/13/2022     3:00 AM 09/14/2022     4:20 AM 09/18/2022    10:19 AM   Weight Monitoring   Section Header. No data exists in this row.      Height     182.9 cm   Weight   85.911 kg 84.369 kg 86.183 kg   BMI (calculated)     25.8 kg/m2         General Appearance:  Cooperative and in no acute distress.    Skin: Warm and dry to touch  Head: Normocephalic  Eyes: Conjunctivae and lids unremarkable.    Neck: No JVD  Chest: Clear to auscultation bilaterally with good respiratory effort and no wheezes, rales, or rhonchi   Cardiovascular: Regular rhythm, S1 normal, S2 normal, 1/6 holosystolic murmur in the mid axillary   Abdomen: Soft, nontender with normoactive bowel sounds.   Extremities: Warm/dry without edema, +2 b/l radial  pulses  Neuro: Alert and oriented x3. No gross motor or sensory deficits noted, affect appropriate.    TTE, 09/08/2022:    * Markedly dilated left ventricle with severely reduced systolic function  (LVEDVi 109 mL/m2, ejection fraction 21%).    * Hypertrabeculated left ventricle with severe global hypokinesis.    * Mild aortic valve and moderate mitral valve regurgitation (details below).    * Wire in right-sided chambers with mild to moderate (2+) peri-device  tricuspid valve regurgitation.    * Dilated, poorly responsive IVC.    * When visually compared with the prior study dated 05/29/2021, LV function  is less vigorous (previous EF 31%, now 21%; previous LVOT VTI 17 cm, now 11  cm); valvular regurgitation is less prominent.    IMPRESSION:   Mr. Bonsell is a 76 y.o. male with the following problems:    Non-ischemic Cardiomyopathy, EF 21%, NYHA Class 4 at admission, ACC/AHA Stage C, LVIDd 6.9 cm, Medtronic BiV-IC   Multiple hospital admissions in 2023 including Nov 2023  Variable diuretic rx compliance, better now  Chronic systolic heart failure  Moderate mitral and tricuspid regurgitation  AKI on CKD3a  Stage IV CLL, dx 06/2020, started acalabrutinib 04/2022  RLE DVT (02/2022)  T2DM, A1C 5.9%  Dyslipidemia  COPD, stable      RECOMMENDATIONS:    Continue with LVAD evaluation, next visit with [facility] is Sep 25, 2022  Continue current medication regimen without change  Follow up with Dr. Laurine Blazer from [facility] regarding CLL.    Continue to limit sodium rich foods, adding salt or eating fast food.  Continue torsemide 40mg  twice daily  Continue to monitor your weight each morning on your home scale.  For a weight gain of 2 pound in a day or 3 pounds in 2-3 days, take an extra dose of torsemide in the morning.  Follow up with , NP in one month (there will be a number of follow up visits with [facility] before that time)  DMV paperwork completed today.  Orders Placed This Encounter   Procedures    Advanced Heart Failure APP Visit ([facility])    ADVANCED HEART FAILURE OFFICE VISIT ([facility])       No orders of the defined types were placed in this encounter.        SIGNED:    Marianna Fuss, MD          This note was generated by the Dragon speech recognition and may contain errors or omissions not intended by the user. Grammatical errors, random word insertions, deletions, pronoun errors, and incomplete sentences are occasional consequences of this technology due to software limitations. Not all errors are caught or corrected. If there are questions or concerns about the content of this note or information contained within the body of this dictation, they should be addressed directly with the author for clarification.

## 2022-09-25 ENCOUNTER — Other Ambulatory Visit: Payer: Self-pay | Admitting: Transplant

## 2022-09-28 ENCOUNTER — Other Ambulatory Visit: Payer: Self-pay

## 2022-09-28 ENCOUNTER — Encounter (INDEPENDENT_AMBULATORY_CARE_PROVIDER_SITE_OTHER): Payer: Self-pay | Admitting: Family Medicine

## 2022-09-28 DIAGNOSIS — I428 Other cardiomyopathies: Secondary | ICD-10-CM

## 2022-09-29 ENCOUNTER — Inpatient Hospital Stay (INDEPENDENT_AMBULATORY_CARE_PROVIDER_SITE_OTHER): Payer: Commercial Managed Care - POS | Admitting: Family Medicine

## 2022-10-01 ENCOUNTER — Encounter (INDEPENDENT_AMBULATORY_CARE_PROVIDER_SITE_OTHER): Payer: Self-pay | Admitting: Family Medicine

## 2022-10-12 DIAGNOSIS — Z95810 Presence of automatic (implantable) cardiac defibrillator: Secondary | ICD-10-CM

## 2022-10-12 DIAGNOSIS — I429 Cardiomyopathy, unspecified: Secondary | ICD-10-CM

## 2022-10-12 LAB — REMOTE CARDIAC DEVICE MONITORING
AF Burden Percentage: 0
ICD Shock Recent Count: 0
RV Pacing Percentage: 13.57

## 2022-10-14 ENCOUNTER — Institutional Professional Consult (permissible substitution): Payer: Commercial Managed Care - POS

## 2022-10-14 ENCOUNTER — Ambulatory Visit: Payer: Commercial Managed Care - POS | Attending: Cardiology | Admitting: Cardiology

## 2022-10-14 ENCOUNTER — Encounter: Payer: Self-pay | Admitting: Cardiology

## 2022-10-14 ENCOUNTER — Other Ambulatory Visit: Payer: Commercial Managed Care - POS

## 2022-10-14 ENCOUNTER — Other Ambulatory Visit (HOSPITAL_BASED_OUTPATIENT_CLINIC_OR_DEPARTMENT_OTHER): Payer: Commercial Managed Care - POS

## 2022-10-14 DIAGNOSIS — I42 Cardiomyopathy: Secondary | ICD-10-CM

## 2022-10-14 DIAGNOSIS — I428 Other cardiomyopathies: Secondary | ICD-10-CM | POA: Insufficient documentation

## 2022-10-14 DIAGNOSIS — I5022 Chronic systolic (congestive) heart failure: Secondary | ICD-10-CM | POA: Insufficient documentation

## 2022-10-14 DIAGNOSIS — N183 Chronic kidney disease, stage 3 unspecified: Secondary | ICD-10-CM

## 2022-10-14 DIAGNOSIS — R7989 Other specified abnormal findings of blood chemistry: Secondary | ICD-10-CM

## 2022-10-14 DIAGNOSIS — I509 Heart failure, unspecified: Secondary | ICD-10-CM

## 2022-10-14 LAB — CBC AND DIFFERENTIAL
Absolute NRBC: 0 10*3/uL (ref 0.00–0.00)
Hematocrit: 40.7 % (ref 37.6–49.6)
Hgb: 12.5 g/dL (ref 12.5–17.1)
Instrument Absolute Neutrophil Count: 3.39 10*3/uL (ref 1.10–6.33)
MCH: 32.7 pg (ref 25.1–33.5)
MCHC: 30.7 g/dL — ABNORMAL LOW (ref 31.5–35.8)
MCV: 106.5 fL — ABNORMAL HIGH (ref 78.0–96.0)
MPV: 13.2 fL — ABNORMAL HIGH (ref 8.9–12.5)
Nucleated RBC: 0 /100 WBC (ref 0.0–0.0)
Platelets: 139 10*3/uL — ABNORMAL LOW (ref 142–346)
RBC: 3.82 10*6/uL — ABNORMAL LOW (ref 4.20–5.90)
RDW: 16 % — ABNORMAL HIGH (ref 11–15)
WBC: 20.02 10*3/uL — ABNORMAL HIGH (ref 3.10–9.50)

## 2022-10-14 LAB — CELL MORPHOLOGY
Cell Morphology: ABNORMAL — AB
Platelet Estimate: DECREASED — AB

## 2022-10-14 LAB — MAN DIFF ONLY
Band Neutrophils Absolute: 0 10*3/uL (ref 0.00–1.00)
Band Neutrophils: 0 %
Basophils Absolute Manual: 0 10*3/uL (ref 0.00–0.08)
Basophils Manual: 0 %
Eosinophils Absolute Manual: 0 10*3/uL (ref 0.00–0.44)
Eosinophils Manual: 0 %
Lymphocytes Absolute Manual: 17.42 10*3/uL — ABNORMAL HIGH (ref 0.42–3.22)
Lymphocytes Manual: 87 %
Monocytes Absolute: 0 10*3/uL — ABNORMAL LOW (ref 0.21–0.85)
Monocytes Manual: 0 %
Neutrophils Absolute Manual: 2.6 10*3/uL (ref 1.10–6.33)
Segmented Neutrophils: 13 %

## 2022-10-14 LAB — HEMOLYSIS INDEX(SOFT): Hemolysis Index: 35 Index — ABNORMAL HIGH (ref 0–24)

## 2022-10-14 LAB — BASIC METABOLIC PANEL
Anion Gap: 13 (ref 5.0–15.0)
BUN: 45 mg/dL — ABNORMAL HIGH (ref 9.0–28.0)
CO2: 25 mEq/L (ref 17–29)
Calcium: 9.3 mg/dL (ref 7.9–10.2)
Chloride: 100 mEq/L (ref 99–111)
Creatinine: 2.3 mg/dL — ABNORMAL HIGH (ref 0.5–1.5)
Glucose: 246 mg/dL — ABNORMAL HIGH (ref 70–100)
Potassium: 4.4 mEq/L (ref 3.5–5.3)
Sodium: 138 mEq/L (ref 135–145)
eGFR: 28.7 mL/min/{1.73_m2} — AB (ref >=60)

## 2022-10-14 LAB — PROBNP: NT-proBNP: 5709 pg/mL — ABNORMAL HIGH (ref 0–450)

## 2022-10-14 LAB — MAGNESIUM: Magnesium: 2.3 mg/dL (ref 1.6–2.6)

## 2022-10-14 NOTE — Procedures (Signed)
Cardiopulmonary Exercise Testing    Date: October 14, 2022     Name:  [name] [name]  MRN:  [id number]   DOB:  June 26, 1946    Reason: Evaluation for Advanced Heart Failure Therapies  Diagnosis: Heart failure with a reduced ejection fraction    Patient is NYHA III without symptoms of acute coronary syndrome.    Exercise Protocol: Treadmill 1 minute ramping  Exercise Time: 6 min 27 sec  Exercise Stopped: Like fatigue  METS achieved: Measured 3.0 METS [Peak VO2/3.5] estimated METS 3.7    ECG: Sinus rhythm, normal axis with rare PVC without changes of ischemia with exercise     HR % MPHR BP RQ VO2 % pred VO2   Baseline 88  113/77      AT 115    8.0 39%   Peak 121 83% 106/70 1.09 10.5 51%     Anaerobic threshold was achieved  VE/VCO2 slope: 42 is a steep slope (>35)  60 seconds HR recovery: 14 bpm is not concerning (< 6 bpm)    Good patient effort without evidence of ischemia.    FEV1    2.39 L (92% predicted)  FVC    3.27 L (96% predicted)  FEV1/FVC   73%  MVV    101 (83% predicted.)  VE/MVV   0.46 (normal <0.8)  O2 sat at peak   100%  Peak Instantaneous VE/VO2 51 (normal <40-50)  O2 Pulse Trajectory  From 7 to 8 mL O2/beat     Exercise test is consistent with cardiac limitations to exercise.     Exercise test is not consistent with pulmonary limitations to exercise.    Based on this cardiopulmonary exercise test, the cardiac prognosis is fair  due to:     This is a maximal exercise test based on a maximal RQ of 1.09   Fair VO2 max 10.5 mL/kg/min (51% of predicted)   Weber functional class C. [Peak VO2: A >20, B 16-20, C 10-15, D <10]   Abnormal VE/VCO2 slope of 42   Decreased HR recovery

## 2022-10-14 NOTE — Progress Notes (Signed)
Heart Transplant / VAD Psychosocial Assessment     Transplant Social Worker: Normajean Glasgow Brysin Towery      Patient: [name] [name]  Med Record Number: [id number]   Date of Birth: 1946/02/20  Age: 76 y.o.    Transplant Organ Type: LVAD Date of Interview: 10/14/2022     Participants in the Interview:  Spouse Regan Lemming)   Location of Interview: Outpatient In-person       Health History Questions Answer/Notes   What is your current health status/diagnosis? Patient Active Problem List   Diagnosis    Type 2 diabetes mellitus with complication, with long-term current use of insulin    Heart attack    CAD (coronary artery disease)    Non-ischemic cardiomyopathy    Acute exacerbation of CHF (congestive heart failure)    PUD (peptic ulcer disease)    Acute dyspnea    CHF (congestive heart failure)    Headaches due to old head injury    Essential hypertension    Syncope and collapse    AICD (automatic cardioverter/defibrillator) present    Cardiomyopathy    SOB (shortness of breath)    GERD (gastroesophageal reflux disease)    Hypertensive heart disease without heart failure    Heart failure, unspecified    Nonischemic cardiomyopathy    Pure hypercholesterolemia    Chronic systolic congestive heart failure    Encounter for implantable defibrillator reprogramming or check    Episode of recurrent major depressive disorder, unspecified depression episode severity    Stage 3 chronic kidney disease, 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 Initially, patient was unsure about LVAD; however, since he has had time to process and further discuss with physician, he is "all for it". Patient shared wanting to prolong life.     Presentation Questions Answer/Notes   Appearance alert, cooperative, no acute distress, well oriented   Affect Appropriate   Speech Pattern normal   Attitude toward Interviewer. Cooperative   Thought Process Logical   Appropriate Behavior/Mannerisms: Yes   Judgment within Normal Limits Yes   Understanding of Disease Risks and Benefits of transplant/VAD Yes   Discussed potential psychological risks to include, but not limited to: depression, anxiety, PTSD, survivor guilt, dependency, etc.  Yes       Support System Questions Answer/Notes   Who is your emergency contact? Arsalan Brisbin (spouse) [phone]   Marital Status: married -31 years. Patient was previously married. That marriage ended in divorce.   Number of children? Patient has four children, all adult (30's and 40's), no relationship with children.   Current resident and household composition/ alternative needed: Patient resides in a single family home with his wife in [place].   In the past 12 months have you been concerned about or unable to pay your rent or mortgage? No- Patient and wife moved into a new home in 2022.   In the past 12 months have you been unable  to get heat, electricity, or water when it was needed? no   Pertinent Family History: N/A   Caregiver at time of transplant or VAD implant:  Primary Caregiver: Asaf Elmquist    Secondary Caregiver: No Secondary support identified.   Transportation: Wife drives   In the past 12  months has the lack of transportation kept you from going to medical appointments, getting your medications, non-medical appointments, work, or from getting things you need? no   Advance directive?  Medical Power of Attorney? discussed- Patient does not have an advanced directive. SW will send a copy for him and wife to review.    Family/Friends Supportive of Pursuit of Transplant: Yes- Patient's wife shared seeing LVAD as "just another challenge". She is grateful that the device exists and that husband is a candidate. She feels that although it's different, seems manageable.   Do you need additional help controlling distressing symptoms such as chest discomfort, shortness of breath, decreased appetite, depression or anxiety? No   Do you want to speak with someone in more detail regarding what your options are and how your care would be managed if you did not proceed with VAD/transplant? No   Are you interested / would you like to see a palliative care specialist to help with your symptom control needs or to discuss your health goals? No       Lifestyle Questions Answer/Notes   Tobacco usage?  Type/frequency/last used? Denies history of tobacco usage   Alcohol use?  Type/frequency/last used?    *Use AUDIT or AUDIT-C screen  Patient reports drinking alcohol when he was younger, but no alcohol use since then.      AUDIT-C not performed as patient does not currently drink alcohol.   Illegal drug use?  Type/frequency/last used? Denies illegal drug use history.   Mental Health History?  Past/Current Therapy     (inpatient vs outpatient)    Medications    *Use PHQ -9 and GAD-7 screens Although patient denies formal mental health history; he did disclose instances of suicidal ideation. Patient provided examples throughout his life in which feelings of frustration/hopelessness caused him to have these thoughts. He shared the following examples:     In the past when you have faced difficult situations how have you coped? ***   Religious affiliation/identity? ***   Interest/Hobbies ***   How has your illness affected your life style? ***   Do you have any pets? {YES/NO(DEFAULT TO YES):21229}   Have you ever had any legal issues with law enforcement? {YES NO DEFAULT NO:24397::"No"}       Functional Status Answer/Notes   Do you have any vision, hearing  issues or a language barrier?   {YES NO DEFAULT NO:24397::"No"}   Is the patient adherent with their medications and/or diet restrictions? {YES/NO(DEFAULT TO YES):21229}   Is patient able to manage their medications and/or diet restrictions independently? {YES/NO(DEFAULT TO ZOX):09604}   Are you able to manage your own medical appointments (doctors, testing, rehab, dialysis)? {YES/NO(DEFAULT TO VWU):98119}   BRIEF Health Literacy Screen:  How often do you have someone help you read hospital material? {TXP BRIEF Lit screen:61843}    How often do you have problems learning about our medical condition because of difficulty understanding written information? {TXP BRIEF Lit screen:61843}    How often do you have a problem understanding what is told to you about your medical condition? {TXP BRIEF Lit screen:61843}    How confident are you filling our medical forms by yourself? {TXP  BRIEF lit screen 2:61844}       Educational/Financial  Questions Answer/Notes   Place of birth/citizenship status: ***   Ethnicity: ***   Primary language?  Interpreter needed? ***   Highest educational level obtained? ***   Work Therapist, sports current/ past employer/ previous work history  ***   Are you disabled?  Social Security?  How long? ***   Significant other occupation and work status? ***   Insurance Coverage? {YES/NO(DEFAULT TO ZOX):09604}   Medication coverage/pharmacy? {YES/NO(DEFAULT TO VWU):98119}   Financial capabilities, resources, and concerns ***   Within the last 12 months, did you worry your food would run out before you got money to buy more? {YES/NO:21004}       Recommendation/Comments/Plans:   Patient appears to be a {ranking:250000::"good"} transplant candidate from a psychosocial perspective.     SIPAT Score: *** {JYNWG:95621}    Strengths:    Risk Factors:    Recommendations:     The above recommendations have been shared with the patient and family.        Transplant Social Worker: Lelon Frohlich

## 2022-10-14 NOTE — Progress Notes (Signed)
Acknowledgement of Ventricular Assist Device (VAD) Education        Name: [name] [name]  MRN: [id number]   Date of Birth: 03/02/1946  Age: 76 y.o.  Gender: male        Pre VAD implantation education completed 10/14/2022    Patient and spouse present for training  Was an interpreter used: No - Not Applicable    Reviewed information contained in the acknowledgment of VAD education including, but not limited to: VAD device description and indication; benefits; patient survival; alternatives to treatment; plan of care, services, VAD specific expectations during the pre-operative, intra-operative, post-operative, and recovery/discharge phases of care.   Potential risks and complications, outpatient management plan including lifestyle changes, follow up requirements for clinic visits, lab work, and drive line monitoring/dressing changes; emergency power planning; VAD equipment maintenance; drive line care; anticoagulation medications; driving; health maintenance; contact information; travel; end of life support; and research.    Lastly, general surgical expectations during the pre-operative, intra-operative, post-operative, and recovery/discharge phases of care were also reviewed.     Method of education:  Above education, VAD specific and general surgery, was provided via explanation, demonstration, handout and video via [email or web address].     Education Response:  Demonstrated controller, battery, and drive line connections for Heart Mate 3 to patient/family. Returned demonstration done without difficulty.  All present were able to ask questions before, during, and after education process. All verbalized that questions were answered to their satisfaction and were encouraged to ask questions if/when they arise.       VAD Coordinator Concerns: None. 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), and CLL    VAD Implant Criteria:    The patient must meet the following 3 VAD implant Criteria:                                  1- Has NYHA Class IV chronic end-stage heart failure:  Yes   AND  2- Has a left ventricular ejection fraction less than or equal to 25%:  Yes  AND  3- IV inotrope dependent: No   If Not inotrope dependent then       MUST HAVE a Cardiac Index (CI) less than 2.2 L/min/m2, while not inotrope therapy Yes:    AND ALSO MEET ONE OF THE FOLLOWING:     Are on optimal medical management (OMM) based on current heart failure practice guidelines for at least 45 out of the last 60 days and are failing to respond  Yes. OR  Have advanced heart failure for at least 14 days and are dependent on an intra-aortic balloon pump (IABP) or similar temporary mechanical circulatory support for at least 7 days No.    The Patient does meet VAD implant criteria currently.      Probability of Recovery:           Patients receiving a VAD must be managed by an explicitly identified, cohesive, multidisciplinary team of medical professionals with appropriate qualifications, training, and experience.  The team embodies collaboration and dedication across medical specialties to offer optimal patient-centered care.  Collectively, the team must ensure that patients and caregivers have the knowledge and support necessary to participate in informed decision making.  The team members must be based at the facility and must include individuals with experience working with patients before and after placement of a VAD.    The team must include, at a minimum:  At  least one physician with cardiothoracic surgery privileges and individual experience implanting at least 10 durable, intracorporeal, left ventricular assist devices over the course of the previous 36 months with activity in the last year.  At least one cardiologist trained in advanced heart failure with clinical competence in medical- and device-based management including VADs, and clinical competence in the management of patients before and after placement of a VAD.  A VAD program  coordinator.  A Child psychotherapist.  A palliative care specialist      Acknowledgement of Ventricular Assist Device (VAD) education signed by patient [name] [name] at this time.     Delford Field, BSN, RN    Stage manager Center  VAD Pager [phone] ID [phone]

## 2022-10-14 NOTE — Progress Notes (Signed)
The results of the lab work performed at your recent nurse/office visit have returned for your review. I will discuss these results with you at your upcoming visit.

## 2022-10-14 NOTE — Progress Notes (Addendum)
DT VAD FINANCIAL CONSULTATION    I met with patient, [name] [name] with his spouse in the outpatient clinic on 10/14/2022.    Patient's insurance is [employer] and his spouse is the subscriber.    The patient is not employed and he receives social security disability benefits.    We discussed medical benefits for DT VAD.    The patient understood his benefits and had no questions at this time.    IMPRESSION:  From a financial point of view, the patient is an acceptable candidate for VAD implant.  Prior authorization is required turn around time is approximately 7 to 10 business days.    [facility] Rodriguez-Brito  VAD/Heart Transplant Financial Coordinator  [facility]

## 2022-10-15 ENCOUNTER — Telehealth: Payer: Self-pay

## 2022-10-15 LAB — CYSTATIN C
Cystatin C: 2.11 mg/L — ABNORMAL HIGH (ref 0.52–1.16)
EGFR: 27 — ABNORMAL LOW (ref >=60)

## 2022-10-15 NOTE — Telephone Encounter (Signed)
SW follow up initiated as patient interested in therapy/counseling resources, advanced directive forms, and connecting with VAD patients to discuss experiences.  SW provided the following information via e-mail as requested:  Therapists in [facility]    [facility]: [address] [place], [place] [address]  [phone]  Specializes in a variety of issues including Depression. Accepts most insurances. Check to see whether they accept [employer]. Visit their page for more information: Counselor - Arts administrator, [facility]  [facility]: [address] [place], [place] [address] [phone]. Provides in person and telehealth visits. Specializes in various issues including depression and anxiety. Visit their page for more information: Find Therapists and Psychologists in [place], [place] - Psychology Today    [facility], LPC: Located in [place], [place] [phone]. Specializes in various issues including depression and anxiety. Accepts insurance and also provides sliding fee scale. Visit her site for more information: [facility] ([email or web address])    [facility]: [address] [place], [place] [address] [phone] ext [phone]. Specializes in various issues including depression and anxiety. Accepts insurance, but check to confirm when making appointment. Visit site for more information: Database administrator, Psychotherapist Contact Form  [place], [place] [address]    [facility]: [address] [place], [place], [address] [phone]. Specializes in various issues including depression and anxiety. Accepts insurance, but check to confirm when making appointment. Visit page for more information: [facility] - Home ([email or web address])    Patient was also advised to check with [employer] for in network therapists.  SW provided the following link for information advanced directives  and downloadable forms:   [facility] ([email or web address])    SW advised that this SW reached out to heart SW, Ronney Asters to help with identifying VAD patients for him to connect with. SW informed that Aundra Millet would be back in the office next week.    SW will remain available to provide support/assistance as needed.    Donney Rankins, MSW  Social Worker  [facility]  Spect[employer]-Link: [phone]

## 2022-10-21 NOTE — Progress Notes (Signed)
[place] [facility] FAILURE OFFICE PROGRESS NOTE    [facility] -CARDIOLOGY  [place] [address]  Dept: [phone]  Dept Fax: [phone]       Patient Name: [name] [name]    Date of Visit:  October 22, 2022  Date of Birth: 1946-08-31  AGE: 77 y.o.  Medical Record #: [id number]      CHIEF COMPLAINT:    Chief Complaint   Patient presents with    Follow-up    Cardiomyopathy       HISTORY OF PRESENT ILLNESS:    I had the pleasure of seeing Mr. Christop Hippert today for 1 month follow up in the [facility] at [facility]. He is a pleasant 78 y.o. male who has history of chronic systolic HF and nonischemic cardiomyopathy with EF 25-30% by echo in August 2022 and EF 21 % by echo in November 2023 when he was admitted for symptoms related to HF. He has a Medtronic ICD in place. Dr. Mervin Kung is his oncologist for management of CLL.     He was admitted to [facility] then transferred to [facility] where a LVAD evaluation was initiated.  He was in the hospitals 11/17-11/27/2023.  He was admitted from our clinic with worsening dyspnea, hypotension, and poor response to diuretics.  As the course of events developed, he reported not taking diuretics regularly along with dietary indiscretions.      His weight at admission was 213 pounds.  He left the hospital at 185 pounds. For HF GDMT, he was previously on carvedilol 25 mg twice daily, Entresto 97/23 mg twice daily, Jardiance 10 mg daily, and spironolactone 12.5 mg daily.  Since leaving the hospital, GDMT is metoprolol succinate 25mg  daily, isordil 10mg  TID, hydralazine 25mg  TID, and jardiance 10mg  daily.      Current diuretic torsemide 40mg  twice daily.  He has been compliant with all of his cardiac medications.  He has been closely monitoring his diet and minimizing fast foods and salty snacks.  He has been doing well since the last visit.  His weight has  been stable 184 to 185 pounds at home.  He has had more energy and less shortness of breath overall.  He had no difficulty walking from the parking lot to our office today. He is currently being evaluation for LVAD.     PAST MEDICAL HISTORY: He has a past medical history of Acute CHF, Acute systolic (congestive) heart failure (08/2017), Anxiety, CAD (coronary artery disease), Cardiomyopathy, CHF (congestive heart failure) (08/12/2014, 2013), Chronic obstructive pulmonary disease, Coronary artery disease (2011), Depression, echocardiogram (02/2016, 11/2016, 09/2017, 12/20/2017), GERD (gastroesophageal reflux disease), Heart attack (2011), Hyperlipemia, Hypertension, ICD (implantable 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, Ischemic cardiomyopathy, Nonischemic cardiomyopathy, NSTEMI (non-ST elevated myocardial infarction), 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 He has a past surgical history that includes duodenal ulcer (1973); orthopedic surgery (AGE 36); EGD (N/A, 08/15/2014); Circumcision (AGE 47); Fracture surgery (AGE 68); Hernia repair (AGE 47); Colonoscopy (2009); Tonsillectomy and adenoidectomy (1954); Cardiac defibrillator placement (12/2012); Cardiac pacemaker placement (2014); EGD (1975); EGD, COLONOSCOPY (N/A, 04/04/2018); ECHOCARDIOGRAM, TRANSTHORACIC (02/2016 11/2016 09/2017 12/2017); MPI nuclear study (06/2016); Correction hammer toe; BIV (11/10/2016); ECHOCARDIOGRAM, TRANSTHORACIC (02/2016,11/2016,12/20/2017); Cardiac catheterization (03/2010); Cardiac catheterization (12/2012); and Right Heart Cath (Right, 09/09/2022).    ALLERGIES:   Allergies   Allergen Reactions  Allopurinol        MEDICATIONS:   Current Outpatient Medications:     apixaban (ELIQUIS) 5 MG, Take 1 tablet (5 mg) by mouth every 12 (twelve) hours For 1 week followed by 1 tablet twice daily, Disp:  90 tablet, Rfl: 2    aspirin EC 81 MG EC tablet, Take 1 tablet (81 mg) by mouth daily, Disp: , Rfl:     atorvastatin (LIPITOR) 40 MG tablet, Take 1 tablet (40 mg total) by mouth nightly, Disp: 90 tablet, Rfl: 0    Calquence 100 MG Tab, , Disp: , Rfl:     hydrALAZINE (APRESOLINE) 25 MG tablet, Take 1 tablet (25 mg) by mouth every 8 (eight) hours, Disp: 90 tablet, Rfl: 2    isosorbide dinitrate (ISORDIL) 10 MG tablet, Take 1 tablet (10 mg) by mouth 3 (three) times daily, Disp: 90 tablet, Rfl: 2    Jardiance 10 MG tablet, TAKE 1 TABLET(10 MG) BY MOUTH DAILY, Disp: 90 tablet, Rfl: 3    loratadine (CLARITIN) 10 MG tablet, Take 1 tablet (10 mg) by mouth daily, Disp: 5 tablet, Rfl: 0    metoprolol succinate XL (TOPROL-XL) 25 MG 24 hr tablet, Take 0.5 tablets (12.5 mg) by mouth daily, Disp: 45 tablet, Rfl: 3    niacin (NIASPAN) 500 MG CR tablet, TAKE 1 TABLET BY MOUTH TWICE DAILY (Patient taking differently: 250 mg Taking half a tablet), Disp: 180 tablet, Rfl: 3    Semglee, yfgn, 100 UNIT/ML Solution Pen-injector, ADMINISTER 50 UNITS UNDER THE SKIN AT BEDTIME, Disp: 45 mL, Rfl: 3    torsemide (DEMADEX) 20 MG tablet, Take 2 tablets (40 mg) by mouth 2 (two) times daily, Disp: 540 tablet, Rfl: 3     FAMILY HISTORY: family history includes Breast cancer in his mother; Coronary artery disease in his mother; Diabetes in his brother and mother; Heart attack in his father; Heart attack (age of onset: 56) in his mother; Hypertension in his father and mother.    SOCIAL HISTORY: He reports that he has never smoked. He has never used smokeless tobacco. He reports that he does not currently use alcohol. He reports that he does not use drugs.      PHYSICAL EXAMINATION    Visit Vitals  BP 104/70 (BP Site: Right arm, Patient Position: Sitting, Cuff Size: Medium)   Pulse 95   Ht 1.829 m (6')   Wt 83.9 kg (185 lb)   BMI 25.09 kg/m           Last 3 Weights:       09/14/2022     4:20 AM 09/18/2022    10:19 AM 10/22/2022     9:48 AM   Weight  Monitoring   Height  182.9 cm 182.9 cm   Weight 84.369 kg 86.183 kg 83.915 kg   BMI (calculated)  25.8 kg/m2 25.1 kg/m2         General Appearance:  Cooperative and in no acute distress.    Skin: Warm and dry to touch  Head: Normocephalic  Eyes: Conjunctivae and lids unremarkable.    Neck: No JVD  Chest: Clear to auscultation bilaterally with good respiratory effort and no wheezes, rales, or rhonchi   Cardiovascular: Regular rhythm, S1 normal, S2 normal, 1/6 holosystolic murmur in the mid axillary   Abdomen: Soft, nontender with normoactive bowel sounds.   Extremities: Warm/dry without edema, +2 b/l radial pulses  Neuro: Alert and oriented x3. No gross motor or sensory deficits noted, affect appropriate.  TTE, 09/08/2022:    * Markedly dilated left ventricle with severely reduced systolic function  (LVEDVi 109 mL/m2, ejection fraction 21%).    * Hypertrabeculated left ventricle with severe global hypokinesis.    * Mild aortic valve and moderate mitral valve regurgitation (details below).    * Wire in right-sided chambers with mild to moderate (2+) peri-device  tricuspid valve regurgitation.    * Dilated, poorly responsive IVC.    * When visually compared with the prior study dated 05/29/2021, LV function  is less vigorous (previous EF 31%, now 21%; previous LVOT VTI 17 cm, now 11  cm); valvular regurgitation is less prominent.      IMPRESSION:   Mr. Brisby is a 77 y.o. 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 He is stable from volume  perspective   Continue remainder of cardiac medication regimen without change. Emphasized importance of medication adherence   Educated on limiting sodium rich foods, adding salt or eating fast food.  Continue to monitor your weight each morning on your home scale.  For a weight gain of 2 pound in a day or 3 pounds in 2-3 days, take an extra dose of torsemide in the morning.  Continue with LVAD evaluation, he has several upcoming appointments with the [facility] transplant team.   Keep scheduled appointment with [facility] in March, or sooner if needed     In total, 40 minutes were spent today in the care and management of this patient, including relevant documentation, labs, testing, chart review, and providing education. .                                                No orders of the defined types were placed in this encounter.      No orders of the defined types were placed in this encounter.        SIGNED:    April, NP          This note was generated by the Dragon speech recognition and may contain errors or omissions not intended by the user. Grammatical errors, random word insertions, deletions, pronoun errors, and incomplete sentences are occasional consequences of this technology due to software limitations. Not all errors are caught or corrected. If there are questions or concerns about the content of this note or information contained within the body of this dictation, they should be addressed directly with the author for clarification.

## 2022-10-22 ENCOUNTER — Ambulatory Visit (INDEPENDENT_AMBULATORY_CARE_PROVIDER_SITE_OTHER): Payer: Commercial Managed Care - POS | Admitting: Nurse Practitioner

## 2022-10-22 ENCOUNTER — Encounter (INDEPENDENT_AMBULATORY_CARE_PROVIDER_SITE_OTHER): Payer: Self-pay | Admitting: Nurse Practitioner

## 2022-10-22 VITALS — BP 104/70 | HR 95 | Ht 72.0 in | Wt 185.0 lb

## 2022-10-22 DIAGNOSIS — I5022 Chronic systolic (congestive) heart failure: Secondary | ICD-10-CM

## 2022-10-22 DIAGNOSIS — I428 Other cardiomyopathies: Secondary | ICD-10-CM

## 2022-10-23 ENCOUNTER — Telehealth: Payer: Self-pay

## 2022-10-23 NOTE — Telephone Encounter (Signed)
Advanced Heart Failure/ LVAD Social Work    SW coordinated peer-support w/ current LVAD pt per pts request.     SW called and spoke w/ pt to f/u re: the above. Pt confirms that he would still like to be connected to LVAD peer-support. SW informed pt that current LVAD pt has been identified and pt is agreeable to contact information being sent to him via e-mail for f/u. SW encouraged pt to reach out should additional needs arise.     SW sent f/u e-mail to pt w/ peer-support contact info.     SW will remain available should ongoing needs arise.     Lenna Sciara, MSW, LCSW  Advanced Heart Failure/ [facility]/ Transplant Social Worker   Heart Transplant and Disease Management Program   [phone]

## 2022-10-26 ENCOUNTER — Ambulatory Visit (INDEPENDENT_AMBULATORY_CARE_PROVIDER_SITE_OTHER): Payer: Commercial Managed Care - POS | Admitting: Family Medicine

## 2022-10-26 ENCOUNTER — Encounter (INDEPENDENT_AMBULATORY_CARE_PROVIDER_SITE_OTHER): Payer: Self-pay | Admitting: Family Medicine

## 2022-10-26 VITALS — BP 102/67 | HR 96 | Temp 98.2°F | Wt 203.8 lb

## 2022-10-26 DIAGNOSIS — N183 Chronic kidney disease, stage 3 unspecified: Secondary | ICD-10-CM

## 2022-10-26 DIAGNOSIS — E118 Type 2 diabetes mellitus with unspecified complications: Secondary | ICD-10-CM

## 2022-10-26 DIAGNOSIS — E1122 Type 2 diabetes mellitus with diabetic chronic kidney disease: Secondary | ICD-10-CM

## 2022-10-26 DIAGNOSIS — H6123 Impacted cerumen, bilateral: Secondary | ICD-10-CM

## 2022-10-26 DIAGNOSIS — F339 Major depressive disorder, recurrent, unspecified: Secondary | ICD-10-CM

## 2022-10-26 DIAGNOSIS — Z794 Long term (current) use of insulin: Secondary | ICD-10-CM

## 2022-10-26 DIAGNOSIS — J42 Unspecified chronic bronchitis: Secondary | ICD-10-CM

## 2022-10-26 DIAGNOSIS — H9193 Unspecified hearing loss, bilateral: Secondary | ICD-10-CM

## 2022-10-26 DIAGNOSIS — C9110 Chronic lymphocytic leukemia of B-cell type not having achieved remission: Secondary | ICD-10-CM

## 2022-10-26 HISTORY — DX: Chronic lymphocytic leukemia of B-cell type not having achieved remission: C91.10

## 2022-10-26 LAB — IHS CARDIOPULMONARY STRESS TEST
BR (%AT/VO2Max): 135 %
BR (%AT/WorkMax): 136 %
BR (%AT2/VO2Max): 122 %
BR (AT): 59.5 %
BR (MaxValue): 89.3 %
BR (Rest): 61.3 %
BR (VO2 Max): 44.2 %
BR (WorkMax): 43.8 %
ExerTime (AT): 248 s
ExerTime (MaxValue): 387 s
ExerTime (VO2Max): 329 s
ExerTime (WorkMax): 357 s
FEF 25-75% (Pre-Bronch) %Pred: 78 %
FEF 25-75% (Pre-Bronch) Actual: 1.52 L/sec
FEF 25-75% (Pre-Bronch) Pred: 1.93 L/sec
FEF Max (Pre-Bronch) %Pred: 105 %
FEF Max (Pre-Bronch) Actual: 8.21 L/sec
FEF Max (Pre-Bronch) Pred: 7.76 L/sec
FEF2575 (Lower Limit of Normal): 0.65 L/sec
FEF2575 (Standard Deviation): 0.99 L/sec
FEFMax (Lower Limit of Normal): 5.11 L/sec
FEFMax (Standard Deviation): 1.6 L/sec
FEV1 (Lower Limit of Normal): 1.73 L
FEV1 (Pre-Bronch) %Pred: 92 %
FEV1 (Pre-Bronch) Actual: 2.39 L
FEV1 (Pre-Bronch) Pred: 2.58 L
FEV1 (Standard Deviation): 0.49 L
FEV1/FVC (Pre-Bronch) %Pred: 96 %
FEV1/FVC (Pre-Bronch) Actual: 73 %
FEV1/FVC (Pre-Bronch) Pred: 76 %
FVC (Lower Limit of Normal): 2.42 L
FVC (Pre-Bronch) %Pred: 96 %
FVC (Pre-Bronch) Actual: 3.27 L
FVC (Pre-Bronch) Pred: 3.41 L
FVC (Standard Deviation): 0.61 L
HRR (%AT/VO2Max): 119 %
HRR (%AT2/VO2Max): 116 %
HRR (AT): 52.3 %
HRR (Rest): 100 %
HRR (Rest): 58 {beats}/min
HRR (VO2Max): 25 {beats}/min
HRR (VO2Max): 43.9 %
HRR(%AT/VO2Max): 119 %
HRR(%AT/WorkMax): 130 %
HRR(%AT/WorkMax): 130 %
HRR(%AT2/VO2Max): 116 %
HRR(AT): 30 {beats}/min
HRR(Max Value): 58 {beats}/min
HRR(MaxValue): 100 %
HRR(Work Max): 23 {beats}/min
HRR(WorkMax): 40.4 %
MVV (% Predicted-Pre): 83 %
MVV (Lower Limit of Normal): 73 L/min
MVV (Pre-Actual): 101 L/min
MVV (Predicted): 121 L/min
MVV (Standard Deviation): 29 L/min
PEF (Pre-Actual): 492.8 L/min
PETCO2 (%AT/VO2Max): 104 %
PETCO2 (%AT/WorkMax): 111 %
PETCO2 (%AT2/VO2Max): 95 %
PETCO2 (AT): 28 mmHg
PETCO2 (MaxValue): 35 mmHg
PETCO2 (Rest): 25 mmHg
PETCO2 (VO2Max): 27 mmHg
PETCO2 (WorkMax): 26 mmHg
PETO2 (%AT/VO2Max): 99 %
PETO2 (%AT/WorkMax): 97 %
PETO2 (%AT2/VO2Max): 101 %
PETO2 (AT): 118 mmHg
PETO2 (MaxValue): 126 mmHg
PETO2 (Rest): 119 mmHg
PETO2 (VO2Max): 120 mmHg
PETO2 (WorkMax): 123 mmHg
RR (%AT/VO2Max): 102 %
RR (%AT/WorkMax): 91 %
RR (%AT2/VO2Max): 119 %
RR (AT): 36 {breaths}/min
RR (MaxValue): 58 {breaths}/min
RR (Rest): 29 {breaths}/min
RR (VO2Max): 36 {breaths}/min
RR (WorkMax): 40 {breaths}/min
Rq (%AT/VO2Max): 97 %
Rq (%AT/WorkMax): 91 %
Rq (%AT2/VO2Max): 101 %
Rq (AT): 1.05
Rq (MaxValue): 1.18
Rq (Rest): 0.9
Rq (VO2Max): 1.08
Rq (WorkMax): 1.15
SpO2 (%AT/VO2Max): 100 %
SpO2 (AT): 100 %
SpO2 (MaxValue): 100 %
SpO2 (Rest): 99 %
SpO2 (VO2Max): 100 %
VCO (%VO2Max/Pred): 50 %
VCO2 (%AT/PredMax): 35 %
VCO2 (%AT/VO2Max): 69 %
VCO2 (%AT/WorkMax): 74 %
VCO2 (%AT2/VO2Max): 74 %
VCO2 (%WorkMax/Pred): 47 %
VCO2 (AT): 0.75 L/min
VCO2 (PredMax): 2.18 L/min
VCO2 (Rest): 0.64 L/min
VCO2 (VO2Max): 1.09 L/min
VCO2 (WorkMax): 1.02 L/min
VCO2 Lmin (MaxValue): 1.11 L/min
VE STPD (%AT/WorkMax): 72 %
VE STPD (%AT2/VO2Max): 82 %
VE STPD (AT): 29.8 L/min
VE STPD (MaxValue): 43.7 L/min
VE STPD (Rest): 28.5 L/min
VE STPD (VO2Max): 41.1 L/min
VE STPD (WorkMax): 41.4 L/min
VE STPD(%AT/VO2Max): 72 %
VO2 %WorkMax/Pred): 49 %
VO2 (%AT/PredMax): 40 %
VO2 (%AT/PredMax): 40 %
VO2 (%AT/VO2Max): 71 %
VO2 (%AT/VO2Max): 71 %
VO2 (%AT/WorkMax): 81 %
VO2 (%AT/WorkMax): 81 %
VO2 (%AT2/VO2Max): 73 %
VO2 (%AT2/VO2Max): 73 %
VO2 (%VO2Max/Pred): 56 %
VO2 (%VO2Max/Pred): 56 %
VO2 (%WorkMax/Pred): 49 %
VO2 (AT): 0.72 L/min
VO2 (AT): 8.2 mL/kg/min
VO2 (MaxValue): 1.01 L/min
VO2 (MaxValue): 11.5 mL/kg/min
VO2 (PredMax): 1.8 L/min
VO2 (PredMax): 20.5 mL/kg/min
VO2 (Rest): 0.71 L/min
VO2 (Rest): 8 mL/kg/min
VO2 (VO2Max): 1.01 L/min
VO2 (VO2Max): 11.5 mL/kg/min
VO2 (WorkMax): 0.89 L/min
VO2 (WorkMax): 10.1 mL/kg/min
VO2 HR (MaxValue): 9 mL/beat
VO2 HR(%AT/VO2 Max): 74 %
VO2 HR(AT): 6 mL/beat
VO2 HR(Predicated Max): 12 mL/beat
VO2 HR(Rest): 8 mL/beat
VO2HR (%AT/WorkMax): 86 %
VO2HR (%AT2/VO2Max): 76 %
VO2HR (VO2 Max): 8 mL/beat
VO2HR (WorkMax): 7 mL/beat
VdVt est (%AT/VO2Max): 122 %
VdVt est (%AT/WorkMax: 116 %
VdVt est (%AT2/VO2Max): 117 %
VdVt est (AT): 0.34
VdVt est (MaxValue: 0.49
VdVt est (Rest: 0.34
VdVt est (VO2Max: 0.28
VdVt est (WorkMax): 0.29
Vt BTPS (%AT/VO2Max): 71 %
Vt BTPS (%AT/WorkMax): 79 %
Vt BTPS (%AT2/VO2Max): 69 %
Vt BTPS (AT): 1002 mL
Vt BTPS (MaxValue): 1411 mL
Vt BTPS (Rest): 1198 mL
Vt BTPS (VO2Max): 1407 mL
Vt BTPS (WorkMax): 1267 mL
Work (%AT/PredMax): 20 %
Work (%AT/VO2Max): 67 %
Work (%AT/WorkMax): 52 %
Work (%AT2/VO2Max): 67 %
Work (%VO2Max/Pred): 30 %
Work (%WorkMax/Pred): 38 %
Work (AT): 22 Watts
Work (MaxValue): 43 Watts
Work (PredMax): 113 Watts
Work (VO2Max): 33 Watts
Work (WorkMax): 43 Watts

## 2022-10-26 MED ORDER — INSULIN GLARGINE-YFGN 100 UNIT/ML SC SOPN
50.0000 [IU] | PEN_INJECTOR | Freq: Every day | SUBCUTANEOUS | 3 refills | Status: DC
Start: 2022-10-26 — End: 2023-01-04

## 2022-10-26 NOTE — Progress Notes (Signed)
Have you seen any specialists since your last visit with us?  Yes      The patient was informed that the following HM items are still outstanding:   eye exam

## 2022-10-26 NOTE — Progress Notes (Signed)
Subjective:      Date: 10/26/2022 2:55 PM   Patient ID: [name] [name] is a 77 y.o. male.    Chief Complaint:  Chief Complaint   Patient presents with    Hospital Follow-up    Ear Fullness       HPI:  HPI  Hospital Course:      77 year old male past medical history of chronic systolic heart failure ejection fraction 21% down from the 31%, negative cardiac catheterization 2014, that is post Medtronic ICD, CKD 3B, insulin-dependent diabetes, stage IV CLL on CALQUENCE( acalabrutinib)  by [place] cancer specialist, COPD, history of right proximal DVT (02/2022) on Eliquis presented to [facility] on day 11/17 with shortness of breath, dyspnea on exertion, orthopnea, lower extremity edema, syncope while driving, and 8 pound weight gain in 2 weeks.  Patient had issues with low systolic blood pressures which limited the use of guidance directed medical therapy but did tolerate diuretics with weight loss.  It was unclear if patient still needed diuresis and was sent to [facility] for right heart cath to evaluate fluid status and restratification.         Condition at Discharge:   1.  Acute on chronic systolic heart failure, most recent ejection fraction 21% with negative cardiac catheterization in the past, status post ICD whose course has been complicated by significant hypotension limiting the use of goal-directed medical therapy.   LVAD eval as outpatient.  Metolazone and Valsartan have been stopped.  Isordil and Hydralazine started.    Patient is today here for follow-up after prolonged hospitalization for congestive heart failure.  He tells me that he was told that his life expectancy would be between 3 to 6 months if he would not do a specific procedure were as they put an artificial implant in his chest.  That seems to be very new procedure.  Patient was evaluated at the hospital from multiple different doctors.  He tells me that the surgery will involve for different heart specialists.  Looking  at his vital signs he is currently in excellent condition and it seems overall he is in good shape.    Decreased hearing patient complains about decreased hearing.  Patient had severe cerumen impaction in both the ears.  I cleaned both the ears with curettage.  Afterwards patient was able to hear better tympanic membrane was visible.    HTN: Hypertension is well controlled at this point.    Patient avoids adding salt and is doing regular cardiac exercise.  Average 105/80      DM : Patient is compliant with current medication.  Tries to exercise at least 3 times a week.  Watches type of diet and amount of carbohydrate intake.  Check lab work for diabetes regularly in our clinic.  I will refill patient's diabetic medication.        Problem List:  Patient Active Problem List   Diagnosis    Type 2 diabetes mellitus with complication, with long-term current use of insulin    Heart attack    CAD (coronary artery disease)    Non-ischemic cardiomyopathy    Acute exacerbation of CHF (congestive heart failure)    PUD (peptic ulcer disease)    Acute dyspnea    CHF (congestive heart failure)    Headaches due to old head injury    Essential hypertension    Syncope and collapse    AICD (automatic cardioverter/defibrillator) present    Cardiomyopathy    SOB (shortness of  breath)    GERD (gastroesophageal reflux disease)    Hypertensive heart disease without heart failure    Heart failure, unspecified    Nonischemic cardiomyopathy    Pure hypercholesterolemia    Chronic systolic congestive heart failure    Encounter for implantable defibrillator reprogramming or check    Episode of recurrent major depressive disorder, unspecified depression episode severity    Stage 3 chronic kidney disease, 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    Anxiety and depression    COPD (chronic obstructive pulmonary disease)    HLD (hyperlipidemia)    Sleep apnea, obstructive    Scrotal edema    Hydrocele, unspecified hydrocele type    Acute on chronic  congestive heart failure, unspecified heart failure type    Acute on chronic right-sided heart failure    Ischemic cardiomyopathy    CLL (chronic lymphocytic leukemia)       Current Medications:  Outpatient Medications Marked as Taking for the 10/26/22 encounter (Office Visit) with Zorita Pang, MD   Medication Sig Dispense Refill    apixaban (ELIQUIS) 5 MG Take 1 tablet (5 mg) by mouth every 12 (twelve) hours For 1 week followed by 1 tablet twice daily 90 tablet 2    aspirin EC 81 MG EC tablet Take 1 tablet (81 mg) by mouth daily      atorvastatin (LIPITOR) 40 MG tablet Take 1 tablet (40 mg total) by mouth nightly 90 tablet 0    Calquence 100 MG Tab       hydrALAZINE (APRESOLINE) 25 MG tablet Take 1 tablet (25 mg) by mouth every 8 (eight) hours 90 tablet 2    isosorbide dinitrate (ISORDIL) 10 MG tablet Take 1 tablet (10 mg) by mouth 3 (three) times daily 90 tablet 2    Jardiance 10 MG tablet TAKE 1 TABLET(10 MG) BY MOUTH DAILY 90 tablet 3    loratadine (CLARITIN) 10 MG tablet Take 1 tablet (10 mg) by mouth daily 5 tablet 0    metoprolol succinate XL (TOPROL-XL) 25 MG 24 hr tablet Take 0.5 tablets (12.5 mg) by mouth daily 45 tablet 3    niacin (NIASPAN) 500 MG CR tablet TAKE 1 TABLET BY MOUTH TWICE DAILY (Patient taking differently: 250 mg Taking half a tablet) 180 tablet 3    torsemide (DEMADEX) 20 MG tablet Take 2 tablets (40 mg) by mouth 2 (two) times daily 540 tablet 3    [DISCONTINUED] Semglee, yfgn, 100 UNIT/ML Solution Pen-injector ADMINISTER 50 UNITS UNDER THE SKIN AT BEDTIME 45 mL 3       Allergies:  Allergies   Allergen Reactions    Allopurinol        Past Medical History:  Past Medical History:   Diagnosis Date    Acute CHF     NOV  & DEC 2018, - DIALYSIS CATH PLACED Aug 24 2017 TO REMOVE FLUID     Acute systolic (congestive) heart failure 08/2017    Anxiety     CAD (coronary artery disease)     Cardiomyopathy     nonischemic    CHF (congestive heart failure) 08/12/2014, 2013    Chronic obstructive  pulmonary disease     POSSIBLE PER PT HE USES  SYNBICORT BID ABD  BREO INHALER PRN    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    Coronary artery disease 2011    CORONARY STENT PLACEMENT FOLLOWED BY [facility]    Depression     echocardiogram 02/2016, 11/2016, 09/2017, 12/20/2017  GERD (gastroesophageal reflux disease)     Heart attack 2011    and 2014    Hyperlipemia     Hypertension     ICD (implantable 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     PLACED 2014  [facility]  LAST INTERROGATION April 01 2018 REPORT REQUESTED    Ischemic cardiomyopathy     EF 15% ON ECHO 09-20-2017 DR GARG IN EP[facility]    Nonischemic cardiomyopathy     NSTEMI (non-ST elevated myocardial infarction)     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    Pacemaker 2014    MEDTRONIC ICD/PACEMAKER COMBO LAST INTERROGATION  12-12 2018    Pneumonia 09/2016    Primary cardiomyopathy     Ischemic cardiomyopathy EF 15% ON ECHO 09-20-2017 DR GARG IN EPIC    Sepsis 08/2017    Syncope and collapse     PRIOR TO ICD/PACEMAKER PLACED 2014    Type 2 diabetes mellitus, controlled DX 1998    BS  AVG 100  A1C 7.4 Dec 27 2017    [place]     PT SEE HIS PMD April 01 2018 RE THIS-TOLD TO SEE PMD BY [facility] JUNE 12 WHEN THEY SAW HIM FOR A CL       Past Surgical History:  Past Surgical History:   Procedure Laterality Date    BIV  11/10/2016    ICD METRONIC  UPGRADE     CARDIAC CATHETERIZATION  03/2010    CORONARY STENT PLACEMENT X 2 PER PT; LM normal, LAD 70-80% distal lesion at apex, 20% lesion mid CFX    CARDIAC CATHETERIZATION  12/2012    CARDIAC DEFIBRILLATOR PLACEMENT  12/2012    [facility]    CARDIAC PACEMAKER PLACEMENT  2014    MEDTRONIC ICD/PACEMAKER PLACED [facility]    CIRCUMCISION  AGE 33    COLONOSCOPY  2009    CORRECTION HAMMER TOE      duodenal ulcer  1973    STRESS RELATED IN SCHOOL    ECHOCARDIOGRAM, TRANSTHORACIC  02/2016 11/2016 09/2017 12/2017    ECHOCARDIOGRAM, TRANSTHORACIC  02/2016,11/2016,12/20/2017    EGD N/A 08/15/2014    Procedure: EGD;  Surgeon: Loraine Maple, MD;  Location:  [facility];  Service: Gastroenterology;  Laterality: N/A;  egd w/ bx    EGD  1975    EGD, COLONOSCOPY N/A 04/04/2018    Procedure: EGD with bxs, COLONOSCOPY with polypectomy and clipping;  Surgeon: Omer Jack, MD;  Location: [facility];  Service: Gastroenterology;  Laterality: N/A;    FRACTURE SURGERY  AGE 66     RT  ANKLE- CAST APPLIED    HERNIA REPAIR  AGE 33    LEFT INGUINAL HERNIA    MPI nuclear study  06/2016    orthopedic surgery  AGE 50    RT foot corrective - [facility]    RIGHT HEART CATH Right 09/09/2022    Procedure: Right Heart Cath;  Surgeon: Wandra Arthurs, MD;  Location: [facility];  Service: Cardiovascular;  Laterality: Right;  tx from [place]       Family History:  Family History   Problem Relation Age of Onset    Breast cancer Mother     Heart attack Mother 50    Hypertension Mother     Diabetes Mother     Coronary artery disease Mother     Diabetes Brother     Heart attack Father     Hypertension Father  Social History:  Social History     Socioeconomic History    Marital status: Married     Spouse name: Octavia    Number of children: 0   Occupational History    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, history    Tobacco Use    Smoking status: Never    Smokeless tobacco: Never   Vaping Use    Vaping Use: Never used   Substance and Sexual Activity    Alcohol use: Not Currently    Drug use: No    Sexual activity: Yes     Partners: Female     Social Determinants of Health     Food Insecurity: No Food Insecurity (09/12/2022)    Hunger Vital Sign     Worried About Running Out of Food in the Last Year: Never true     Ran Out of Food in the Last Year: Never true   Transportation Needs: No Transportation Needs (09/12/2022)    PRAPARE - Therapist, art (Medical): No     Lack of Transportation (Non-Medical): No   Intimate Partner Violence: Not At Risk (09/12/2022)    Humiliation, Afraid, Rape, and Kick  questionnaire     Fear of Current or Ex-Partner: No     Emotionally Abused: No     Physically Abused: No     Sexually Abused: No   Housing Stability: Low Risk  (09/12/2022)    Housing Stability Vital Sign     Unable to Pay for Housing in the Last Year: No     Number of Places Lived in the Last Year: 1     Unstable Housing in the Last Year: No        The following sections were reviewed this encounter by the provider:   Tobacco  Allergies  Meds  Problems  Med Hx  Surg Hx  Fam Hx         Vitals:  BP 102/67 (BP Site: Right arm, Patient Position: Sitting, Cuff Size: Large)   Pulse 96   Temp 98.2 F (36.8 C) (Temporal)   Wt 92.4 kg (203 lb 12.8 oz)   SpO2 98%   BMI 27.64 kg/m     ROS:  Constitutional: Negative for fever, chills and malaise/fatigue.   Respiratory: Negative for cough, shortness of breath and wheezing.    Cardiovascular: Negative for chest pain and palpitations.   Gastrointestinal: Negative for abdominal pain, diarrhea and constipation.   Musculoskeletal: Negative for joint pain.   Skin: Negative for rash.   Neurological: Negative for dizziness and headaches.   Psychiatric/Behavioral: The patient does not have insomnia.         Objective:     Physical Exam:  Constitutional: Patient is oriented to person, place, and time. Appears well-developed and well-nourished.   HENT:   Head: Normocephalic and atraumatic.   Eyes: Conjunctivae and EOM are normal. Pupils are equal, round, and reactive to light.   Cardiovascular: Normal rate, regular rhythm and normal heart sounds.  Exam reveals no gallop and no friction rub.    No murmur heard.  Pulmonary/Chest: Effort normal and breath sounds normal. Has no wheezes.   Musculoskeletal: Normal range of motion. Exhibits no edema or tenderness.   Neurological: Patient is alert and oriented to person, place, and time.   Psychiatric: Patient has a normal mood and affect.        Assessment/Plan:       1. Type 2 diabetes mellitus with complication, with  long-term  current use of insulin  - Insulin Glargine-yfgn (Semglee, yfgn,) 100 UNIT/ML Solution Pen-injector; 50 Units by Subdermal route daily  Dispense: 45 mL; Refill: 3  Patient symptoms are well controlled under current treatment.  Recommend to continue medication as prescribed.    2. CLL (chronic lymphocytic leukemia)  Currently well-controlled patient is seeing hematologist CLL is under control.    3. Chronic bronchitis, unspecified chronic bronchitis type  Currently well-controlled patient sees a pulmonologist.    4. Episode of recurrent major depressive disorder, unspecified depression episode severity  Patient symptoms are well controlled under current treatment.  Recommend to continue medication as prescribed.    5. 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    6. Decreased hearing of both ears  Cerumen removal removal with curettage from both the ears.  It is that mostly resolved the problem.    7. Bilateral impacted cerumen  Resolved.      Zorita Pang, MD

## 2022-10-27 ENCOUNTER — Telehealth (INDEPENDENT_AMBULATORY_CARE_PROVIDER_SITE_OTHER): Payer: Self-pay | Admitting: Family Medicine

## 2022-10-27 NOTE — Telephone Encounter (Signed)
[place]: [id number] help? Call us at [phone]  Status  Sent to [employer]  Drug  Insulin Glargine-yfgn 100UNIT/ML pen-injectors  Form  Librarian, academic PA Form [phone] NCPDP)

## 2022-10-28 ENCOUNTER — Encounter (INDEPENDENT_AMBULATORY_CARE_PROVIDER_SITE_OTHER): Payer: Self-pay | Admitting: Family Medicine

## 2022-10-28 NOTE — Telephone Encounter (Signed)
[name] [name] (Key: [id number])  PA Case ID #: [id number]  Need Help? Call us at [phone]  Outcome  Additional Information Required  Your case has been denied based on the information in the patient's profile. Please review the appeals information attached. ETKKOE:[id number];JDYNXG:ZFPOIP;Review Type:Prior Auth;Appeal Information: Attention:ATTN: [place] [id number]. PGFQM,KJ,[id number] QLRJP:[phone] RAJ:[phone]; Important - Please read the below note on eAppeals: Please reference the denial letter for information on the rights for an appeal, rationale for the denial, and how to submit an appeal including if any information is needed to support the appeal. Note about urgent situations - Generally, an urgent situation is one which, in the opinion of the provider, the health of the patient may be in serious jeopardy or may experience pain that cannot be adequately controlled while waiting for a decision on the appeal.;  Drug  Insulin Glargine-yfgn 100UNIT/ML pen-injectors  ePA cloud Risk analyst PA Form [phone] NCPDP)

## 2022-11-04 ENCOUNTER — Other Ambulatory Visit (INDEPENDENT_AMBULATORY_CARE_PROVIDER_SITE_OTHER): Payer: Commercial Managed Care - POS | Admitting: Cardiovascular Disease

## 2022-11-20 ENCOUNTER — Other Ambulatory Visit: Payer: Self-pay

## 2022-11-20 DIAGNOSIS — I509 Heart failure, unspecified: Secondary | ICD-10-CM

## 2022-11-20 DIAGNOSIS — I428 Other cardiomyopathies: Secondary | ICD-10-CM

## 2022-11-23 ENCOUNTER — Ambulatory Visit (HOSPITAL_BASED_OUTPATIENT_CLINIC_OR_DEPARTMENT_OTHER): Payer: Commercial Managed Care - POS | Admitting: Internal Medicine

## 2022-11-23 ENCOUNTER — Ambulatory Visit: Payer: Commercial Managed Care - POS | Admitting: Thoracic Surgery (Cardiothoracic Vascular Surgery)

## 2022-11-23 ENCOUNTER — Ambulatory Visit
Admission: RE | Admit: 2022-11-23 | Discharge: 2022-11-23 | Disposition: A | Discharge status: Home / Self Care | Payer: Commercial Managed Care - POS | Source: Ambulatory Visit | Attending: Cardiovascular Disease | Admitting: Cardiovascular Disease

## 2022-11-23 ENCOUNTER — Ambulatory Visit: Payer: Commercial Managed Care - POS | Attending: Cardiovascular Disease

## 2022-11-23 VITALS — BP 120/83 | HR 97 | Temp 97.6°F | Ht 72.0 in | Wt 201.4 lb

## 2022-11-23 DIAGNOSIS — C9110 Chronic lymphocytic leukemia of B-cell type not having achieved remission: Secondary | ICD-10-CM

## 2022-11-23 DIAGNOSIS — I420 Dilated cardiomyopathy: Secondary | ICD-10-CM

## 2022-11-23 DIAGNOSIS — I428 Other cardiomyopathies: Secondary | ICD-10-CM | POA: Insufficient documentation

## 2022-11-23 DIAGNOSIS — I252 Old myocardial infarction: Secondary | ICD-10-CM

## 2022-11-23 DIAGNOSIS — Z515 Encounter for palliative care: Secondary | ICD-10-CM

## 2022-11-23 DIAGNOSIS — N189 Chronic kidney disease, unspecified: Secondary | ICD-10-CM

## 2022-11-23 DIAGNOSIS — N183 Chronic kidney disease, stage 3 unspecified: Secondary | ICD-10-CM

## 2022-11-23 DIAGNOSIS — I509 Heart failure, unspecified: Secondary | ICD-10-CM | POA: Insufficient documentation

## 2022-11-23 DIAGNOSIS — C9111 Chronic lymphocytic leukemia of B-cell type in remission: Secondary | ICD-10-CM

## 2022-11-23 NOTE — Progress Notes (Unsigned)
Advanced Heart Failure/ LVAD Social Work    Pt see by Dr. Mohammed [name] and Dr. Murtis [name] prior to this writers visit; both w/ concerns re: pt understanding of LVAD and limited social support.     SW met w/ pt and spouse in clinic for f/u assessment. At time of meeting, pt and spouse pleasant and forthcoming in discussion w/ this Clinical research associate. Pt able to share next steps in the LVAD evaluation process including finishing his consults and the committee presentation. SW provided general edu re: LVAD eval process, anticipated IP admission, post-op OP course, caregiver support, and ongoing lifelong care. Throughout discussion pt appears to have limited insight to LVAD intervention and the gravity of LVAD therapy. SW explained that and LVAD is much different than a pacemaker/defibrillator and that it is a life-altering intervention that needs to be thoughtfully considered, hence the extensive evaluation process; pt appeared shocked at the comparison but he is open talking with an LVAD pt and their caregiver for peer support about the intervention. Pt remembers this Clinical research associate talking w/ him on the phone re: peer-support, but reports he did not receive the e-mail w/ contact information for f/u. Spouse encouraged that e-mails be sent to both her and the pt since she is more reliable via e-mail.     SW reviewed caregiver support expectations w/ pt and spouse. Spouse confirms that she will be the primary caretaker post-implant. She reports that she works as a Artist in [employer] and anticipates that she will take time off from work to provide care. SW asked spouse if she had looking to her time-off benefits from her employer already; spouse has not yet discussed this with her employer as she is waiting "for a date of surgery" to do so. SW explained the importance of gathering and knowing this information as part of the evaluation process; she reports understanding and will plan to f/u w/ her HR dept to inquire about  benefits. Both pt and spouse receive Social Security retirement income. They report that they have [employer] for their health insurance through the spouses employer. Pt stopped in September 2023 d/t his health. Pt and spouse report that they are not able to identify any back up caregiver support as the pts spouses' family is in [place] and they are not able to come and help. They report that there are no children to help. SW provided edu re: the importance of secondary support and encouraged pt and spouse to talk w/ friends/family to identify back up support; they are in agreement and will f/u.     SW f/u'd re: pts mental health. Pt reports that since he and his spouse moved into a single level home from their 3-story townhouse his mental health is much better. Pt noted that the steps at their old home limited his activities, but that has been much better with the move; the wife agrees. Pt remains open to mental health support services but does not feel like he needs them at this time.     SW sent the below e-mail to pt and spouse for f/u re: discussion:     "Hi Mr. and Mrs. [name] [name],     It was great to meet with you both earlier this week! Below are the follow up items that we discussed:     Mrs. [name] [name], please reach out to your HR dept and inquire about your time-off benefits so you know what is available to you when you need to provide caregiver  support. Do you have FMLA? How much time off will they allow you? Will you receive any income during the time that you are out of work providing caregiver support? Just some questions to help get the conversation started. Please keep me posted about the information you find either by e-mail or phone.     Please talk with friends, family, neighbors, church members, or others who are you are close to in your community to see if you can identify others who can provide backup support if there is an emergency. We discussed the caregiver expectations in clinic on Monday  and I also attached a caregiver contract that you can review with the people you identify. Please complete the caregiver contract by signing as the respective roles. When we present you to the committee they are going to want to hear that you have identified some backup support people to assist with your care.     Below is a link to the [employer] where you can find videos of patients sharing their stories about life with an LVAD:     [email or web address]     Below are two LVAD patients that are around your age and are happy to talk with you both about their experience and life with the LVAD:     [name] S. - I talked with him this morning and he and his wife are happy to talk to you both about the LVAD experience and caregiver role. They think its most beneficial if you are able to meet in person because he has found that patients seem relieved when they see him walking around and living his life, but of course this is not mandatory. His phone has a spam protecting feature on it, so he says its best for you to first text him and set up a time for a call otherwise the call will not go through if his phone does not recognize your number. His number is P#: [phone].    [name] [name] - I spoke with him earlier in the year when I first called you about peer-support and he is happy to answer any questions that you have about life with an LVAD. He can be reached at P#: [phone].     Please keep me posted on the information you gather and how it was talking with either [name] [name] or both of them. Let me know if there is anything else I can help with. Talk soon!"    SW will remain available for ongoing psychosocial support as needed.     [name] [name], [name] [name], [name] [name]  Advanced Heart Failure/ MCS/ Transplant Social Worker   [facility]   [phone]

## 2022-11-23 NOTE — Progress Notes (Signed)
VAD NUTRITION EVALUATION  November 23, 2022      [name] [name] is a/an 77 y.o. male who was seen today for nutrition evaluation for VAD/Heart transplant candidacy. Patient is a *** nutrition risk for VAD/heart transplant d/t ***. Fried Frailty Index of *** d/t ***. Patient is categorized as *** per Clinical Frailty Scale, ***. Patient does / not meet criteria for malnutrition. Additional nutrition/functional related items:       ASPEN Malnutrition Guide: pt does not meet clinical criteria   Clinical Frailty Scale:    4 Vulnerable (While not dependent on others for daily help, often symptoms limit activities. Often report incomplete symptom control, feeling "slowed up", or tired. A similar complaint is that their health stands in the way of doing as they wish.)    5 Mildly Frail [These people often have more evidence of slowing down and need help in high order IADLs (finances, transportation, heavy housework, medication management). 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 Pt is here today with his wife who will be his primary caregiver post VAD. He had recent hospitalization in Nov 2023 for acute on chronic heart failure, where he underwent diuresis from 213lbs to 185lbs. He reports being more mindful of his diet and sodium intake since this  time. His weights at home per NP at [place] remained 184-185lbs in early January. His weight today is 201lbs. He reports weighing around 192-193lbs recently on home scale. He is unsure if his weight gain is fluid or body weight related. He does not believe he has had any body weight loss in past year - or past several years.  Weight trends of past several year show wide fluctuations likely r/t fluid though achieving weight ~90kg.     Pt denies N/V/D, reports some constipation intermittently. He is not on a bowel regimen, he was encouraged to f/u with his Physician if this becomes more frequent or severe. We discussed nutritional tips to manage constipation. He reports acid reflux about 1-2x/week and attributes this to taking torsemide. He is not on medication for reflux. We discussed nutritional tips to manage.     He has not completed cardiac rehab but reports Dr Judeth Horn did refer him in the past. He is able to perform light housework and walk around the house. He estimates he can walk 1/2 block outside before needing to stop and rest. He walked into clinic today without assistance device. He does not engage in physical activity. We discussed the value of this today to build strength prior to surgery.       Patient was educated on nutrition guidelines and expectations post LVAD, including Vitamin K-Coumadin drug-nutrient interaction. Pt verbalized  understanding of topics presented and asked appropriate questions. All questions at time of visit were addressed.     Anthropometrics:  Ht: 183 cm (6 ft)  Wt: 91.4kg (201lbs)  BMI: 27.31 kg/m2   UBW: pt estimates 190-195lbs     IBW: 81kg (2  %Ideal Body Weight: ***  Goal Weight: ***             Estimated Dry Weight: ***  Weight Change: ***  Voluntary/Involuntary: ***  Weight Change Time Period: ***    Wt Readings from Last 12 Encounters:   11/23/22 91.4 kg (201 lb 6.4 oz)   10/26/22 92.4 kg (203 lb 12.8 oz)   10/22/22 83.9 kg (185 lb)   09/18/22 86.2 kg (190 lb)   09/14/22  84.4 kg (186 lb)   09/09/22 84.1 kg (185 lb 6.4 oz)   09/04/22 96.6 kg (213 lb)   08/04/22 94.3 kg (207 lb 12.8 oz)   07/24/22 91.6 kg (202 lb)   07/03/22 93.9 kg (207 lb)   05/22/22 92.1 kg (203 lb)   04/18/22 93.4 kg (206 lb)       Food and Nutrition-Related History  Patient Reported Diet: general  Typical Food/Fluid Intake: high salt/fat/sugar  Meal/Snack Patterns: *** meals per day  B:  L:  D:  Snacks:  Beverages:  Dining Out: ***  Meal Preparation: ***  Supplemental Drinks/Foods/Additives: ***  Sodium Intake: ***  Food Allergies: ***    Activity   Typical Activity Patterns: ***  Functional Status: nonambulatory;not able to prepare meals;not able to purchase food        Physical Assessment:   Head: {NFPE HEAD:48240}  Upper Body: {NFPE UPPER BODY:45743}  Lower Body: {NFPE LOWER BODY:45745}    Edema: ***    Skin: ***    Gastric Function:  Gastric Emptying: ***  Barium Swallow: ***    Malnutrition: Pt does not meet criteria for malnutrition.    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    Relevant Labs:       Invalid input(s): "ADIFF", "REFLX", "CANCL", "BAND", "ABAND"             Relevant Meds: ***         Nutrition Needs:      Estimated Nutritional Needs based on ***                    Energy: *** kcal/day (Based on 25-30 kcal/kg, *** kg)  Protein: *** g/day (Based on 1.0-1.2 g/kg, *** kg)  Fluid: *** mLs/day (Based on 25, *** kg)      Nutrition Diagnosis:  Food and nutrition knowledge deficit r/t post-transplant and post-LVAD nutrition guidelines as evidenced by lack of prior education on nutritional needs  post transplant / VAD surgery.          Interventions/Recommendations:   1. Educated on ***.   2. Discussed  post transplant nutrition recommendations, with emphasis on balanced meals for optimal BP, BG and wt control. Emailed pt handout that will be used to review in the post transplant clinic.   3. Discussed drug nutrient interactions with immunosuppressive meds.   4. Discussed food safety guidelines.   5. Educated on drug  nutrient interaction of Vitamin K with coumadin.   6. Educated on Vitamin K consistent eating pattern.     Pt verbalized understanding of all.     Monitoring/Evaluation:  Understanding and Motivation to Follow Recommendations: ***  Pt provided RD contact info for ongoing support.     Rober Minion [place], [place]

## 2022-11-23 NOTE — Progress Notes (Signed)
11/23/22 1220   Spirometry Pre and/or Post   Procedure Location and Type Diagnostic - Pre Only   Pre FVC 3.39 L   Pre FEV1 2.5 L   Pre FEV1/FVC % (Calculated) 74   Lung Volumes   Method Body box   Procedure status Completed   DLCO   Procedure status Completed

## 2022-11-23 NOTE — Progress Notes (Signed)
[facility] Contacts (Mon-Fri 8a-4:30p): [facility]ra: [phone],  [facility] Pager: #[phone]      Palliative Care Consult   Date Time: November 23, 2022 4:21 PM   Patient Name: [name] [name]   Primary Care Physician: Clearance Coots, MD   Consulting Provider: Jorene Minors, MD   Consulting Service: Palliative Medicine and Comprehensive Care  Consulted request from No att. providers found to see patient regarding: DT LVAD evaluation  Palliative Diagnosis Category: LVAD/Transplant  Patient Type: New     Assessment & Plan   Impression   [name] [name] 77 y.o. 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 Palliative medicine is consulted as routine part of his evaluation      Estimated Prognosis: Unclear         Recommendations   1. Goals of Care:   2. LVAD/transplant evaluation:  - Pt has capacity for medical decision making and represents himself   - His wife [name] [name] is present for support  - Goals are to "get healthy" and prolong his life  - His LVAD education was over a month and he has a very limited understanding of the LVAD process, workup and potential complications while living with an LVAD  - He asked good questions, but demonstrated some lack of insight/understanding (whether the procedure would be outpatient, can be done today, etc)  - His wife is his only physical and psychosocial support system. Other family in [place] will not be able to move up to [place] per patient  - Had no prior AD, took a blank copy home      From a palliative perspective, a DT LVAD would align with the patient's goals of improved quality and longevity of life, but I am unsure as to whether he has a good understanding of what getting and having an LVAD entails and whether he has adequate social support. For these reasons, he is a poor candidate for DT LVAD from palliative perspective.      He is very educated, thoughtful and intelligent. Would benefit from repeat LVAD education, social work followup. Should he still desire to proceed after repeat education and be able to identify adequate social support, I believe that he could be a good candidate.       Code Status:       - Full         Advance Care Planning:  ACP Validation: Advance care planning discussion initialized  Medical Decision Maker: Next of Kin status:   [name] [name] (spouse) [phone]  ACP Document: None    3. Psychosocial: LVAD/transplant LCSW following    4. Spiritual:   - Chaplain available for support    5. PC Team follow-up plans: As needed      Outpatient Follow Up Recommended: No    Outcomes: Family meetings, Clarified goals of care, and ACP counseling assistance  60 minutes.  Total time today in care of [name] [name]  Discussed with Patient, Family, Consultants, and referring provider with recommendations above.       Start Time:   1400                Stop Time:   1500  Start Time:                   Stop Time:         Baldo Ash  Graciela Husbands, MD  Palliative Medicine & Comprehensive Care  Service Contacts (Mon-Fri 8a-4:30p): [facility]: [phone],  [facility] Pager: [phone]      History of Presenting Illness   [name] [name] is a 77 y.o. male admitted to hospital on (Not on file) with Heart failure, unspecified [I50.9]     Background:  Marital Status: married to his wife for 32 years  Family: 4 children, living in [place]  Home/Living situation: Lives at home with his wife, functions independently  Occupation/Hobbies: Worked as a highschool/college history Runner, 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video, enjoys watching TV, going to the bookstore and reading    Chart reviewed. Patient was seen in the  [facility]  at the request of the transplant cardiology as part of the evaluation for DT LVAD. I explained the goals and philosophy of palliative care as it pertains to preparing for DT LVAD therapy and patient/family agreeable to continue consult. Patient  was able to verbalize understanding of current diagnosis, prognosis and available treatment options.  We discussed the general complications that can be seen in DT LVAD in the perioperative periods including (1) occasional prolonged intubation and the rare need for tracheostomy if need for intubation last beyond 1-2 weeks, (2) AKI that may or may not require hemodialysis which is usually but not always temporary. (3) I also discussed the expected cardiogenic shock that occurs after these types of procedures which is expected to resolve in the post op period.   We specifically discussed the indications and goals of LVAD therapy as well as the possible complications including infection, stroke, equipment failure, worsening right or left heart failure, bleeding, multiorgan system failure and the possibility that they may not have the quality of life they are hoping for by having the device placed.   We specifically discussed advanced directives and determination of healthcare surrogate as decision maker if patient not able to voice their wishes.   We discussed what the patient would want in regards to care if he had a catastrophic event resulting in situation he/she felt was poor quality of life.    Patient and family were given the opportunity to ask questions. Patient/family expressed comfort with plan of care and wish to proceed with further education and evaluation for DT LVAD.           Goals of Care   CURRENT CPR Status: Full       Advanced Care Planning:      Decisional Capacity: yes      Advance Directives have been completed in the past: no      Advance Directives are available in chart: no      Discussed this admission: yes      ACP note completed: no       Date:      GOC Discussion: Life prolongation      Outcome of Discussion: treatment/curative pathway        Palliative Functional and Symptom Assessment      ADLs prior to admission:    Independent Needs assistance Dependent   Ambulation [x]  []  []     Transferring [x]  []  []    Dressing [x]  []  []    Bathing [x]  []  []    Toileting [x]  []  []    Feeding [x]  []  []      Palliative Performance Scale: 70% - Reduced ambulation, unable to do normal work, some evidence of disease, full self-care, normal or reduced intake, full LOC    FAST Score (Dementia Patients): N/A    ECOG (Cancer Patients): N/A  [place] Symptom Assessment Scale (ESAS): Completed  Anxiety: 0  Depression: 0  Drowsiness: 0  Lack of Appetite: 0  Nausea: 0  Pain: 0  Shortness of Breath: 3  Tiredness: 3  Well-being: 3     Review of Systems     [x]  As per HPI, above ESAS and physical exam, otherwise all systems negative    Pain: none  Pain Location and Description: NA          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    Coronary artery disease 2011    CORONARY STENT PLACEMENT FOLLOWED BY [facility]    Depression     echocardiogram 02/2016, 11/2016, 09/2017, 12/20/2017    GERD (gastroesophageal reflux disease)     Heart attack 2011    and 2014    Hyperlipemia     Hypertension     ICD (implantable 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     PLACED 2014  [facility]  LAST INTERROGATION April 01 2018 REPORT REQUESTED    Ischemic cardiomyopathy     EF 15% ON ECHO 09-20-2017 DR GARG IN EPIC    Nonischemic cardiomyopathy     NSTEMI (non-ST elevated myocardial infarction)     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    Pacemaker 2014    MEDTRONIC ICD/PACEMAKER COMBO LAST INTERROGATION  12-12 2018    Pneumonia 09/2016    Primary cardiomyopathy     Ischemic cardiomyopathy EF 15% ON ECHO  09-20-2017 DR GARG IN EPIC    Sepsis 08/2017    Syncope and collapse     PRIOR TO ICD/PACEMAKER PLACED 2014    Type 2 diabetes mellitus, controlled DX 1998    BS  AVG 100  A1C 7.4 Dec 27 2017    [facility]     PT SEE HIS PMD April 01 2018 RE THIS-TOLD TO SEE PMD BY [facility] JUNE 12 WHEN THEY SAW HIM FOR A CL      Past Surgical History:   Procedure Laterality Date    BIV  11/10/2016    ICD METRONIC  UPGRADE     CARDIAC CATHETERIZATION  03/2010    CORONARY STENT PLACEMENT X 2 PER PT; LM normal, LAD 70-80% distal lesion at apex, 20% lesion mid CFX    CARDIAC CATHETERIZATION  12/2012    CARDIAC DEFIBRILLATOR PLACEMENT  12/2012    [facility]    CARDIAC PACEMAKER PLACEMENT  2014    MEDTRONIC ICD/PACEMAKER PLACED [facility]    CIRCUMCISION  AGE 75    COLONOSCOPY  2009    CORRECTION HAMMER TOE      duodenal ulcer  1973    STRESS RELATED IN SCHOOL    ECHOCARDIOGRAM, TRANSTHORACIC  02/2016 11/2016 09/2017 12/2017    ECHOCARDIOGRAM, TRANSTHORACIC  02/2016,11/2016,12/20/2017    EGD N/A 08/15/2014    Procedure: EGD;  Surgeon: Loraine Maple, MD;  Location: [facility];  Service: Gastroenterology;  Laterality: N/A;  egd w/ bx    EGD  1975    EGD, COLONOSCOPY N/A 04/04/2018    Procedure: EGD with bxs, COLONOSCOPY with polypectomy and clipping;  Surgeon: Omer Jack, MD;  Location: [facility];  Service: Gastroenterology;  Laterality: N/A;    FRACTURE SURGERY  AGE 16     RT  ANKLE- CAST APPLIED    HERNIA REPAIR  AGE 3    LEFT INGUINAL HERNIA    MPI nuclear study  06/2016    orthopedic surgery  AGE 30    RT foot corrective - HAMMERTOE    RIGHT HEART CATH Right 09/09/2022    Procedure: Right Heart Cath;  Surgeon: Wandra Arthurs, MD;  Location: [facility];  Service: Cardiovascular;  Laterality: Right;  tx from [place]      Family History   Problem Relation Age of Onset    Breast cancer Mother     Heart attack Mother 20    Hypertension Mother     Diabetes Mother     Coronary  artery disease Mother     Diabetes Brother     Heart attack Father     Hypertension Father        Social History   Substance Use:    reports that he has never smoked. He has never used smokeless tobacco.    reports that he does not currently use alcohol.    reports no history of drug use.     Cultural Concerns:    Translator needed: [x]  NO   []  YES                                           Medications     Current Outpatient Medications   Medication Instructions    apixaban (ELIQUIS) 5 mg, Oral, Every 12 hours, For 1 week followed by 1 tablet twice daily    aspirin EC 81 mg, Oral, Daily    atorvastatin (LIPITOR) 40 mg, Oral, At bedtime    Calquence 100 MG Tab No dose, route, or frequency recorded.    hydrALAZINE (APRESOLINE) 25 mg, Oral, Every 8 hours scheduled    Insulin Glargine-yfgn (SEMGLEE (YFGN)) 50 Units, Subdermal, Daily    isosorbide dinitrate (ISORDIL) 10 mg, Oral, 3 times daily with nitrate free interval    Jardiance 10 mg, Oral, Daily    loratadine (CLARITIN) 10 mg, Oral, Daily    metoprolol succinate XL (TOPROL-XL) 12.5 mg, Oral, Daily    niacin (NIASPAN) 500 MG CR tablet TAKE 1 TABLET BY MOUTH TWICE DAILY    torsemide (DEMADEX) 40 mg, Oral, 2 times daily         Allergies   Allergies   Allergen Reactions    Allopurinol        Physical Exam   BP 120/83   Pulse 97   Temp 97.6 F (36.4 C) (Oral)   Ht 1.829 m (6')   Wt 91.4 kg (201 lb 6.4 oz)   SpO2 98%   BMI 27.31 kg/m    Physical Exam:  General: well developed man sitting up in chair in NAD   HEENT:  EOMI, anicteric, MMM  Neck: supple   CV: RRR, no murmur appreciated  Lungs:unlabored, CTAB, no wheeze/crackles   Abd: soft, NT, ND, NABS, no rebound or guarding  Ext: no edema  Neuro: awake, alert, oriented x 3, no focal deficits  Psych:  appropriate insight and judgement, mood and affect congruent with current condition  Skin: no rashes or lesions noted      Labs / Radiology     Results       ** No results found for the last 24 hours. **

## 2022-11-23 NOTE — Progress Notes (Signed)
[place]    Reason for Consult: DT LVAD evaluation  Assessment:   77 M, mixed NICM / ICM, s/p MDT ICD (initially placed 2013, replaced 2018), CAD/NSTEMI s/p stent 2011 (has not had recent LHC), stg IV CLL (thought to be well controlled on acalabrutinib, followed by Dr. Mervin Kung, [facility]), RLE DVT (on apixaban), CKD (Cr incr from 1.5 04/16/22 to 2.3 10/14/22, does not think he has had it rechecked since, previously on HD/UF 2018)    RHC 09/09/22: RA 7, PA 41/21/29, W 14, fCO 4.0 / fCI 2.0, tCO 3.5 / tCI 1.7  TTE 09/08/22: LVIDd 7.0, LVEF 21%, hypertrabeculated LV apex, RVDd 3.7, nl RV fn, mld AI, mod MR, mod TR, no atrial shunt  CPET 10/14/22: Vo81m 10.5, VE/VCO2 42    Today in clinic he could not recall prior LVAD education.  Repeat education as well as contact with current LVAD patient being arranged.  He appears to be an appropriate higher risk surgical candidate for DT LVAD.  Worsening CKD is concerning and may limit his candidacy.    Plan:   To be reviewed at the multidisciplinary advanced heart failure meeting.    History of Presenting Illness:   77 M, long standing h/o mixed cardiomyopathy.  Had NSTEMI in 2011 with PCI (has not had recent LHC and prior images not available).  MPI in 2017 was negative for ischemia.  Admitted late last year with ADHF requiring inpt diuresis.  Currently feels better.  Is winded after climbing a flight of stairs.  Able to walk ~ 1/2 a block.      Although LVAD eval / education was started during his prior admission, he clearly does not recall much information about the device or the need for major surgery to implant.  Today he is able to recount concerns about VAD complications that were discussed earlier in the day.          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    Coronary artery disease 2011    CORONARY STENT PLACEMENT FOLLOWED BY [facility]    Depression     echocardiogram 02/2016, 11/2016, 09/2017, 12/20/2017    GERD (gastroesophageal reflux disease)     Heart attack 2011    and 2014    Hyperlipemia     Hypertension     ICD (implantable 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     PLACED 2014  [facility]  LAST INTERROGATION April 01 2018 REPORT REQUESTED    Ischemic cardiomyopathy     EF 15% ON ECHO 09-20-2017 DR GARG IN EPIC    Nonischemic cardiomyopathy     NSTEMI (non-ST elevated myocardial infarction)     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    Pacemaker 2014    MEDTRONIC ICD/PACEMAKER COMBO LAST INTERROGATION  12-12 2018    Pneumonia 09/2016    Primary cardiomyopathy     Ischemic cardiomyopathy EF 15%  ON ECHO 09-20-2017 DR GARG IN EPIC    Sepsis 08/2017    Syncope and collapse     PRIOR TO ICD/PACEMAKER PLACED 2014    Type 2 diabetes mellitus, controlled DX 1998    BS  AVG 100  A1C 7.4 Dec 27 2017    [place]     PT SEE HIS PMD April 01 2018 RE THIS-TOLD TO SEE PMD BY [facility] JUNE 12 WHEN THEY SAW HIM FOR A CL     Past Surgical History:     Past Surgical History:   Procedure Laterality Date    BIV  11/10/2016    ICD METRONIC  UPGRADE     CARDIAC CATHETERIZATION  03/2010    CORONARY STENT PLACEMENT X 2 PER PT; LM normal, LAD 70-80% distal lesion at apex, 20% lesion mid CFX    CARDIAC CATHETERIZATION  12/2012    CARDIAC DEFIBRILLATOR PLACEMENT  12/2012    [facility]    CARDIAC PACEMAKER PLACEMENT  2014    MEDTRONIC ICD/PACEMAKER PLACED [facility]    CIRCUMCISION  AGE 89    COLONOSCOPY  2009    CORRECTION HAMMER TOE      duodenal ulcer  1973    STRESS RELATED IN SCHOOL    ECHOCARDIOGRAM, TRANSTHORACIC  02/2016 11/2016 09/2017 12/2017    ECHOCARDIOGRAM,  TRANSTHORACIC  02/2016,11/2016,12/20/2017    EGD N/A 08/15/2014    Procedure: EGD;  Surgeon: Loraine Maple, MD;  Location: [facility];  Service: Gastroenterology;  Laterality: N/A;  egd w/ bx    EGD  1975    EGD, COLONOSCOPY N/A 04/04/2018    Procedure: EGD with bxs, COLONOSCOPY with polypectomy and clipping;  Surgeon: Omer Jack, MD;  Location: [facility];  Service: Gastroenterology;  Laterality: N/A;    FRACTURE SURGERY  AGE 77     RT  ANKLE- CAST APPLIED    HERNIA REPAIR  AGE 89    LEFT INGUINAL HERNIA    MPI nuclear study  06/2016    orthopedic surgery  AGE 22    RT foot corrective - HAMMERTOE    RIGHT HEART CATH Right 09/09/2022    Procedure: Right Heart Cath;  Surgeon: Wandra Arthurs, MD;  Location: [facility];  Service: Cardiovascular;  Laterality: Right;  tx from [place]     Family History:     Family History   Problem Relation Age of Onset    Breast cancer Mother     Heart attack Mother 58    Hypertension Mother     Diabetes Mother     Coronary artery disease Mother     Diabetes Brother     Heart attack Father     Hypertension Father      Social History:     Social History     Tobacco Use   Smoking Status Never   Smokeless Tobacco Never     Social History     Substance and Sexual Activity   Alcohol Use Not Currently     Social History     Substance and Sexual Activity   Drug Use No     Allergies:     Allergies   Allergen Reactions    Allopurinol      Medications:     Current Outpatient Medications:     apixaban (ELIQUIS) 5 MG, Take 1 tablet (5 mg) by mouth every 12 (twelve) hours For 1 week followed by  1 tablet twice daily, Disp: 90 tablet, Rfl: 2    aspirin EC 81 MG EC tablet, Take 1 tablet (81 mg) by mouth daily, Disp: , Rfl:     atorvastatin (LIPITOR) 40 MG tablet, Take 1 tablet (40 mg total) by mouth nightly, Disp: 90 tablet, Rfl: 0    Calquence 100 MG Tab, , Disp: , Rfl:     hydrALAZINE (APRESOLINE) 25 MG tablet, Take 1 tablet  (25 mg) by mouth every 8 (eight) hours, Disp: 90 tablet, Rfl: 2    Insulin Glargine-yfgn (Semglee, yfgn,) 100 UNIT/ML Solution Pen-injector, 50 Units by Subdermal route daily, Disp: 45 mL, Rfl: 3    isosorbide dinitrate (ISORDIL) 10 MG tablet, Take 1 tablet (10 mg) by mouth 3 (three) times daily, Disp: 90 tablet, Rfl: 2    Jardiance 10 MG tablet, TAKE 1 TABLET(10 MG) BY MOUTH DAILY, Disp: 90 tablet, Rfl: 3    loratadine (CLARITIN) 10 MG tablet, Take 1 tablet (10 mg) by mouth daily, Disp: 5 tablet, Rfl: 0    metoprolol succinate XL (TOPROL-XL) 25 MG 24 hr tablet, Take 0.5 tablets (12.5 mg) by mouth daily, Disp: 45 tablet, Rfl: 3    niacin (NIASPAN) 500 MG CR tablet, TAKE 1 TABLET BY MOUTH TWICE DAILY (Patient taking differently: 250 mg Taking half a tablet), Disp: 180 tablet, Rfl: 3    torsemide (DEMADEX) 20 MG tablet, Take 2 tablets (40 mg) by mouth 2 (two) times daily, Disp: 540 tablet, Rfl: 3     Physical Exam:   Very pleasant , A+Ox3, NAD at rest  Chest CTAB  CV regular, 2/6 sys murmur  Abd soft, ND, NT  Extr no dependent edema, 2+ pedal pulses      LABS/IMAGING RESULTS     Lab Results   Component Value Date/Time    WBC 20.02 (H) 10/14/2022 07:47 AM    HGB 12.5 10/14/2022 07:47 AM    HGB 13.6 01/15/2016 10:33 AM    PLT 139 (L) 10/14/2022 07:47 AM    PLT 174 01/15/2016 10:33 AM    NA 138 10/14/2022 07:47 AM    BUN 45.0 (H) 10/14/2022 07:47 AM    CREAT 2.3 (H) 10/14/2022 07:47 AM    CREAT 1.24 02/28/2018 03:18 PM    CREAT 1.06 01/15/2016 10:33 AM    ALB 3.4 (L) 09/14/2022 05:22 AM    BILITOTAL 1.7 (H) 09/14/2022 05:22 AM    AST 14 09/14/2022 05:22 AM    ALT 11 09/14/2022 05:22 AM    ALKPHOS 80 09/14/2022 05:22 AM    PT 16.9 (H) 09/10/2022 04:19 AM    INR 1.5 (H) 09/10/2022 04:19 AM    HGBA1C 5.9 (H) 08/06/2022 09:12 AM    HGBA1C 10.3 (H) 03/21/2010 06:00 AM    BNP 5,709 (H) 10/14/2022 07:47 AM                    Signed by: Paulla Fore, MD, MD  CARDIAC SURGERY   [phone] or [phone]

## 2022-11-25 ENCOUNTER — Telehealth: Payer: Self-pay

## 2022-11-25 NOTE — Telephone Encounter (Signed)
Advanced Heart Failure/ LVAD Social Work    SW received return e-mail from pts spouse who reported that she spoke w/ her HR dept who confirmed that she has access to "paid FMLA" and will "need to notify them as soon as Cornelio is hospitalized and I have approximate date he would come home requiring my care".     She also reports that she will "talk to [place] this evening about a retired friend in the area to be our emergency backup".     She and the pt also plan on f/u w/ peer-support.     SW sent return message expressing gratitude for the update. SW will await further f/u from pt and spouse.     SW will remain available for ongoing psychosocial support as needed.     Lenna Sciara, MSW, LCSW  Advanced Heart Failure/ MCS/ Transplant Social Worker   [facility]   [phone]

## 2022-11-30 LAB — PLETHYSMOGRAPHY
DL/VA- %Pred: 93 %
DL/VA- Actual: 4.22 ml/min/mmHg/L
DL/VA- Pred: 4.53 ml/min/mmHg/L
DLCOunc (Lower Limit of Normal): 18.2 ml/min/mmHg
DLCOunc- %Pred: 103 %
DLCOunc- Actual: 25.9 ml/min/mmHg
DLCOunc- Pred: 24.96 ml/min/mmHg
DLVA (Lower Limit of Normal): 3.14 ml/min/mmHg/L
DLVA (Standard Deviation): 0.84 ml/min/mmHg/L
ERV (Pre-Bronch) %Pred: 12 %
ERV (Pre-Bronch) Actual: 0.16 L
ERV (Pre-Bronch) Pred: 1.23 L
ERVSB (Pre-Actual): 0.16 L
FEF 25-75% (Pre-Bronch) %Pred: 93 %
FEF 25-75% (Pre-Bronch) Actual: 1.81 L/sec
FEF 25-75% (Pre-Bronch) Pred: 1.93 L/sec
FEF Max (Pre-Bronch) %Pred: 91 %
FEF Max (Pre-Bronch) Actual: 7.13 L/sec
FEF Max (Pre-Bronch) Pred: 7.76 L/sec
FEF2575 (Lower Limit of Normal): 0.65 L/sec
FEF2575 (Standard Deviation): 0.99 L/sec
FEFMax (Lower Limit of Normal): 5.11 L/sec
FEFMax (Standard Deviation): 1.6 L/sec
FEV1 (Lower Limit of Normal): 1.73 L
FEV1 (Pre-Bronch) %Pred: 97 %
FEV1 (Pre-Bronch) Actual: 2.5 L
FEV1 (Pre-Bronch) Pred: 2.58 L
FEV1 (Standard Deviation): 0.49 L
FEV1/FVC (Pre-Bronch) %Pred: 97 %
FEV1/FVC (Pre-Bronch) Actual: 74 %
FEV1/FVC (Pre-Bronch) Pred: 76 %
FVC (Lower Limit of Normal): 2.42 L
FVC (Pre-Bronch) %Pred: 99 %
FVC (Pre-Bronch) Actual: 3.39 L
FVC (Pre-Bronch) Pred: 3.41 L
FVC (Standard Deviation): 0.61 L
Gaw (Pre-Bronch) %Pred: 257 %
Gaw (Pre-Bronch) Actual: 2.65 L/s/cmH2O
Gaw (Pre-Bronch) Pred: 1.03 L/s/cmH2O
IC (Pre-Bronch) %Pred: 114 %
IC (Pre-Bronch) Actual: 2.82 L
IC (Pre-Bronch) Pred: 2.45 L
ICTLCPleth (Pre-Actual): 47 %
ICTLCSB (Pre-Actual): 45 %
PEF (Pre-Actual): 427.7 L/min
RV (Pleth) (Pre-Bronch) %Pred: 117 %
RV (Pleth) (Pre-Bronch) Actual: 2.99 L
RV (Pleth) (Pre-Bronch) Pred: 2.54 L
RV/TLC (Pleth) (Pre-Bronch) %Pred: 134 %
RV/TLC (Pleth) (Pre-Bronch) Actual: 50 %
RV/TLC (Pleth) (Pre-Bronch) Pred: 37 %
RVPleth (Lower Limit of Normal): 1.92 L
RVPleth (Standard Deviation): 0.37 L
RVSB (Pre-Actual): 2.88 L
RVTLCPl (Lower Limit of Normal): 30 %
RVTLCPl (Standard Deviation): 4 %
RVTLCSB (Pre-Actual): 46 %
Raw (Lower Limit of Normal): 0.66 cmH2O/L/s
Raw (Pre-Bronch) %Pred: 26 %
Raw (Pre-Bronch) Actual: 0.38 cmH2O/L/s
Raw (Pre-Bronch) Pred: 1.45 cmH2O/L/s
Raw (Standard Deviation): 0.48 cmH2O/L/s
SVCSB (Pre-Actual): 3.43 L
TGV (Pre-Bronch) %Pred: 83 %
TGV (Pre-Bronch) Actual: 3.15 L
TGV (Pre-Bronch) Pred: 3.75 L
TGVPleth (Lower Limit of Normal): 2.57 L
TGVPleth (Standard Deviation): 0.72 L
TLC (Pleth) (Pre-Bronch) %Pred: 85 %
TLC (Pleth) (Pre-Bronch) Actual: 5.97 L
TLC (Pleth) (Pre-Bronch) Pred: 7 L
TLCPleth (Lower Limit of Normal): 5.69 L
TLCPleth (Standard Deviation): 0.79 L
VA (% Predicted-Pre): 97 %
VA (Lower Limit of Normal): 5.08 L
VA (Pre-Actual): 6.14 L
VA- Pred: 6.33 L
sGaw (Lower Limit of Normal): 0.08 1/cmH2O*s
sGaw (Pre-Bronch) %Pred: 100 %
sGaw (Pre-Bronch) Actual: 0.2 1/cmH2O*s
sGaw (Pre-Bronch) Pred: 0.2 1/cmH2O*s
sGaw (Standard Deviation): 0.07 1/cmH2O*s
sRaw (Pre-Bronch) %Pred: 103 %
sRaw (Pre-Bronch) Actual: 4.92 cmH2O*s
sRaw (Pre-Bronch) Pred: 4.76 cmH2O*s

## 2022-11-30 LAB — SPIROMETRY WITHOUT BRONCHODILATOR
DL/VA- %Pred: 93 %
DL/VA- Actual: 4.22 ml/min/mmHg/L
DL/VA- Pred: 4.53 ml/min/mmHg/L
DLCOunc (Lower Limit of Normal): 18.2 ml/min/mmHg
DLCOunc- %Pred: 103 %
DLCOunc- Actual: 25.9 ml/min/mmHg
DLCOunc- Pred: 24.96 ml/min/mmHg
DLVA (Lower Limit of Normal): 3.14 ml/min/mmHg/L
DLVA (Standard Deviation): 0.84 ml/min/mmHg/L
ERV (Pre-Bronch) %Pred: 12 %
ERV (Pre-Bronch) Actual: 0.16 L
ERV (Pre-Bronch) Pred: 1.23 L
ERVSB (Pre-Actual): 0.16 L
FEF 25-75% (Pre-Bronch) %Pred: 93 %
FEF 25-75% (Pre-Bronch) Actual: 1.81 L/sec
FEF 25-75% (Pre-Bronch) Pred: 1.93 L/sec
FEF Max (Pre-Bronch) %Pred: 91 %
FEF Max (Pre-Bronch) Actual: 7.13 L/sec
FEF Max (Pre-Bronch) Pred: 7.76 L/sec
FEF2575 (Lower Limit of Normal): 0.65 L/sec
FEF2575 (Standard Deviation): 0.99 L/sec
FEFMax (Lower Limit of Normal): 5.11 L/sec
FEFMax (Standard Deviation): 1.6 L/sec
FEV1 (Lower Limit of Normal): 1.73 L
FEV1 (Pre-Bronch) %Pred: 97 %
FEV1 (Pre-Bronch) Actual: 2.5 L
FEV1 (Pre-Bronch) Pred: 2.58 L
FEV1 (Standard Deviation): 0.49 L
FEV1/FVC (Pre-Bronch) %Pred: 97 %
FEV1/FVC (Pre-Bronch) Actual: 74 %
FEV1/FVC (Pre-Bronch) Pred: 76 %
FVC (Lower Limit of Normal): 2.42 L
FVC (Pre-Bronch) %Pred: 99 %
FVC (Pre-Bronch) Actual: 3.39 L
FVC (Pre-Bronch) Pred: 3.41 L
FVC (Standard Deviation): 0.61 L
Gaw (Pre-Bronch) %Pred: 257 %
Gaw (Pre-Bronch) Actual: 2.65 L/s/cmH2O
Gaw (Pre-Bronch) Pred: 1.03 L/s/cmH2O
IC (Pre-Bronch) %Pred: 114 %
IC (Pre-Bronch) Actual: 2.82 L
IC (Pre-Bronch) Pred: 2.45 L
ICTLCPleth (Pre-Actual): 47 %
ICTLCSB (Pre-Actual): 45 %
PEF (Pre-Actual): 427.7 L/min
RV (Pleth) (Pre-Bronch) %Pred: 117 %
RV (Pleth) (Pre-Bronch) Actual: 2.99 L
RV (Pleth) (Pre-Bronch) Pred: 2.54 L
RV/TLC (Pleth) (Pre-Bronch) %Pred: 134 %
RV/TLC (Pleth) (Pre-Bronch) Actual: 50 %
RV/TLC (Pleth) (Pre-Bronch) Pred: 37 %
RVPleth (Lower Limit of Normal): 1.92 L
RVPleth (Standard Deviation): 0.37 L
RVSB (Pre-Actual): 2.88 L
RVTLCPl (Lower Limit of Normal): 30 %
RVTLCPl (Standard Deviation): 4 %
RVTLCSB (Pre-Actual): 46 %
Raw (Lower Limit of Normal): 0.66 cmH2O/L/s
Raw (Pre-Bronch) %Pred: 26 %
Raw (Pre-Bronch) Actual: 0.38 cmH2O/L/s
Raw (Pre-Bronch) Pred: 1.45 cmH2O/L/s
Raw (Standard Deviation): 0.48 cmH2O/L/s
SVCSB (Pre-Actual): 3.43 L
TGV (Pre-Bronch) %Pred: 83 %
TGV (Pre-Bronch) Actual: 3.15 L
TGV (Pre-Bronch) Pred: 3.75 L
TGVPleth (Lower Limit of Normal): 2.57 L
TGVPleth (Standard Deviation): 0.72 L
TLC (Pleth) (Pre-Bronch) %Pred: 85 %
TLC (Pleth) (Pre-Bronch) Actual: 5.97 L
TLC (Pleth) (Pre-Bronch) Pred: 7 L
TLCPleth (Lower Limit of Normal): 5.69 L
TLCPleth (Standard Deviation): 0.79 L
VA (% Predicted-Pre): 97 %
VA (Lower Limit of Normal): 5.08 L
VA (Pre-Actual): 6.14 L
VA- Pred: 6.33 L
sGaw (Lower Limit of Normal): 0.08 1/cmH2O*s
sGaw (Pre-Bronch) %Pred: 100 %
sGaw (Pre-Bronch) Actual: 0.2 1/cmH2O*s
sGaw (Pre-Bronch) Pred: 0.2 1/cmH2O*s
sGaw (Standard Deviation): 0.07 1/cmH2O*s
sRaw (Pre-Bronch) %Pred: 103 %
sRaw (Pre-Bronch) Actual: 4.92 cmH2O*s
sRaw (Pre-Bronch) Pred: 4.76 cmH2O*s

## 2022-11-30 LAB — CARBON MONOXIDE DIFFUSING CAPACITY
DL/VA- %Pred: 93 %
DL/VA- Actual: 4.22 ml/min/mmHg/L
DL/VA- Pred: 4.53 ml/min/mmHg/L
DLCOunc (Lower Limit of Normal): 18.2 ml/min/mmHg
DLCOunc- %Pred: 103 %
DLCOunc- Actual: 25.9 ml/min/mmHg
DLCOunc- Pred: 24.96 ml/min/mmHg
DLVA (Lower Limit of Normal): 3.14 ml/min/mmHg/L
DLVA (Standard Deviation): 0.84 ml/min/mmHg/L
ERV (Pre-Bronch) %Pred: 12 %
ERV (Pre-Bronch) Actual: 0.16 L
ERV (Pre-Bronch) Pred: 1.23 L
ERVSB (Pre-Actual): 0.16 L
FEF 25-75% (Pre-Bronch) %Pred: 93 %
FEF 25-75% (Pre-Bronch) Actual: 1.81 L/sec
FEF 25-75% (Pre-Bronch) Pred: 1.93 L/sec
FEF Max (Pre-Bronch) %Pred: 91 %
FEF Max (Pre-Bronch) Actual: 7.13 L/sec
FEF Max (Pre-Bronch) Pred: 7.76 L/sec
FEF2575 (Lower Limit of Normal): 0.65 L/sec
FEF2575 (Standard Deviation): 0.99 L/sec
FEFMax (Lower Limit of Normal): 5.11 L/sec
FEFMax (Standard Deviation): 1.6 L/sec
FEV1 (Lower Limit of Normal): 1.73 L
FEV1 (Pre-Bronch) %Pred: 97 %
FEV1 (Pre-Bronch) Actual: 2.5 L
FEV1 (Pre-Bronch) Pred: 2.58 L
FEV1 (Standard Deviation): 0.49 L
FEV1/FVC (Pre-Bronch) %Pred: 97 %
FEV1/FVC (Pre-Bronch) Actual: 74 %
FEV1/FVC (Pre-Bronch) Pred: 76 %
FVC (Lower Limit of Normal): 2.42 L
FVC (Pre-Bronch) %Pred: 99 %
FVC (Pre-Bronch) Actual: 3.39 L
FVC (Pre-Bronch) Pred: 3.41 L
FVC (Standard Deviation): 0.61 L
Gaw (Pre-Bronch) %Pred: 257 %
Gaw (Pre-Bronch) Actual: 2.65 L/s/cmH2O
Gaw (Pre-Bronch) Pred: 1.03 L/s/cmH2O
IC (Pre-Bronch) %Pred: 114 %
IC (Pre-Bronch) Actual: 2.82 L
IC (Pre-Bronch) Pred: 2.45 L
ICTLCPleth (Pre-Actual): 47 %
ICTLCSB (Pre-Actual): 45 %
PEF (Pre-Actual): 427.7 L/min
RV (Pleth) (Pre-Bronch) %Pred: 117 %
RV (Pleth) (Pre-Bronch) Actual: 2.99 L
RV (Pleth) (Pre-Bronch) Pred: 2.54 L
RV/TLC (Pleth) (Pre-Bronch) %Pred: 134 %
RV/TLC (Pleth) (Pre-Bronch) Actual: 50 %
RV/TLC (Pleth) (Pre-Bronch) Pred: 37 %
RVPleth (Lower Limit of Normal): 1.92 L
RVPleth (Standard Deviation): 0.37 L
RVSB (Pre-Actual): 2.88 L
RVTLCPl (Lower Limit of Normal): 30 %
RVTLCPl (Standard Deviation): 4 %
RVTLCSB (Pre-Actual): 46 %
Raw (Lower Limit of Normal): 0.66 cmH2O/L/s
Raw (Pre-Bronch) %Pred: 26 %
Raw (Pre-Bronch) Actual: 0.38 cmH2O/L/s
Raw (Pre-Bronch) Pred: 1.45 cmH2O/L/s
Raw (Standard Deviation): 0.48 cmH2O/L/s
SVCSB (Pre-Actual): 3.43 L
TGV (Pre-Bronch) %Pred: 83 %
TGV (Pre-Bronch) Actual: 3.15 L
TGV (Pre-Bronch) Pred: 3.75 L
TGVPleth (Lower Limit of Normal): 2.57 L
TGVPleth (Standard Deviation): 0.72 L
TLC (Pleth) (Pre-Bronch) %Pred: 85 %
TLC (Pleth) (Pre-Bronch) Actual: 5.97 L
TLC (Pleth) (Pre-Bronch) Pred: 7 L
TLCPleth (Lower Limit of Normal): 5.69 L
TLCPleth (Standard Deviation): 0.79 L
VA (% Predicted-Pre): 97 %
VA (Lower Limit of Normal): 5.08 L
VA (Pre-Actual): 6.14 L
VA- Pred: 6.33 L
sGaw (Lower Limit of Normal): 0.08 1/cmH2O*s
sGaw (Pre-Bronch) %Pred: 100 %
sGaw (Pre-Bronch) Actual: 0.2 1/cmH2O*s
sGaw (Pre-Bronch) Pred: 0.2 1/cmH2O*s
sGaw (Standard Deviation): 0.07 1/cmH2O*s
sRaw (Pre-Bronch) %Pred: 103 %
sRaw (Pre-Bronch) Actual: 4.92 cmH2O*s
sRaw (Pre-Bronch) Pred: 4.76 cmH2O*s

## 2022-12-08 NOTE — Progress Notes (Addendum)
Patient Evaluation for Ventricular Assist Device/ Transplant    Name: [name] [name]  MRN:  [id number]  Date of Birth: 1946-07-05  Age: 77 y.o.  Gender: male      Evaluation for:  VAD:Yes     Transplant: No    Referring Physician: [facility]    Blood Group: O POS    Height, Weight, BMI: Estimated body mass index is 27.31 kg/m as calculated from the following:    Height as of 11/23/22: 1.829 m (6').    Weight as of 11/23/22: 91.4 kg (201 lb 6.4 oz).    HPI:   He is a pleasant 77 y.o. gentleman with chronic systolic HF with 2 recent hospital admissions, nonischemic cardiomyopathy, and LVEF 21 % by echo in November 2023.  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  He is a recently retired Secretary/administrator and high school history professor.  He has found it difficult to conduct regular daily activities without shortness of breath.       In November 2023, he was admitted from the cardiology clinic with worsening dyspnea, hypotension, and poor response to diuretics.  As the course of events developed, he reported not taking diuretics regularly along with dietary indiscretions.  His weight at admission was 213 pounds.  He left the hospital at 185 pounds and continues to weight 185 pounds on his home scale.     Despite not taking diuretics regularly, he was taking GDMT as prescribed (metoprolol succinate '25mg'$  daily, isordil '10mg'$  TID, hydralazine '25mg'$  TID, and jardiance '10mg'$  daily). Current diuretic torsemide '40mg'$  twice daily.     He has a Medtronic ICD in place.  Dr. Mervin Kung ([facility]) is his oncologist for management of CLL.    Past Medical History:   Patient Active Problem List   Diagnosis    Type 2 diabetes mellitus with complication, with long-term current use of insulin    Heart attack    CAD (coronary artery disease)    Non-ischemic cardiomyopathy    Acute exacerbation of CHF (congestive heart failure)    PUD (peptic ulcer disease)    Acute dyspnea    CHF (congestive heart failure)    Headaches due  to old head injury    Essential hypertension    Syncope and collapse    AICD (automatic cardioverter/defibrillator) present    Cardiomyopathy    SOB (shortness of breath)    GERD (gastroesophageal reflux disease)    Hypertensive heart disease without heart failure    Heart failure, unspecified    Nonischemic cardiomyopathy    Pure hypercholesterolemia    Chronic systolic congestive heart failure    Encounter for implantable defibrillator reprogramming or check    Episode of recurrent major depressive disorder, unspecified depression episode severity    Stage 3 chronic kidney disease, 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    Anxiety and depression    COPD (chronic obstructive pulmonary disease)    HLD (hyperlipidemia)    Sleep apnea, obstructive    Scrotal edema    Hydrocele, unspecified hydrocele type    Acute on chronic congestive heart failure, unspecified heart failure type    Acute on chronic right-sided heart failure    Ischemic cardiomyopathy    CLL (chronic lymphocytic leukemia)       Past Surgical History:   Past Surgical History:   Procedure Laterality Date    BIV  11/10/2016    ICD METRONIC  UPGRADE     CARDIAC CATHETERIZATION  03/2010    CORONARY STENT PLACEMENT X 2 PER PT; LM normal, LAD 70-80% distal lesion at apex, 20% lesion mid CFX    CARDIAC CATHETERIZATION  12/2012    CARDIAC DEFIBRILLATOR PLACEMENT  12/2012    [facility]    CARDIAC PACEMAKER PLACEMENT  2014    MEDTRONIC ICD/PACEMAKER PLACED [facility]    CIRCUMCISION  AGE 5    COLONOSCOPY  2009    CORRECTION HAMMER TOE      duodenal ulcer  1973    STRESS RELATED IN SCHOOL    ECHOCARDIOGRAM, TRANSTHORACIC  02/2016 11/2016 09/2017 12/2017    ECHOCARDIOGRAM, TRANSTHORACIC  02/2016,11/2016,12/20/2017    EGD N/A 08/15/2014    Procedure: EGD;  Surgeon: Loraine Maple, MD;  Location: [facility];  Service: Gastroenterology;  Laterality: N/A;  egd w/ bx    EGD  1975    EGD, COLONOSCOPY N/A 04/04/2018    Procedure: EGD with bxs, COLONOSCOPY with  polypectomy and clipping;  Surgeon: Omer Jack, MD;  Location: [facility];  Service: Gastroenterology;  Laterality: N/A;    FRACTURE SURGERY  AGE 10     RT  ANKLE- CAST APPLIED    HERNIA REPAIR  AGE 5    LEFT INGUINAL HERNIA    MPI nuclear study  06/2016    orthopedic surgery  AGE 77    RT foot corrective - [facility]    RIGHT HEART CATH Right 09/09/2022    Procedure: Right Heart Cath;  Surgeon: Wandra Arthurs, MD;  Location: [facility];  Service: Cardiovascular;  Laterality: Right;  tx from [place]       Past Social History:  he reports that he has never smoked. He has never used smokeless tobacco. He reports that he does not currently use alcohol. He reports that he does not use drugs.    Immunization History:   Immunization History   Administered Date(s) Administered    COVID-19 Ad26 vaccine 18 years and above (Janssen) 0.5 mL 12/30/2019    COVID-19 mRNA MONOVALENT vaccine PRIMARY SERIES 12 years and above (Moderna) 100 mcg/0.5 mL 10/09/2020    INFLUENZA HIGH DOSE 65 YRS+ 08/07/2015, 11/30/2016, 07/28/2017, 08/18/2018, 06/28/2020, 12/15/2021    INFLUENZA HIGH DOSE 65 YRS+ Quad 0.7 mL 08/04/2022    INFLUENZA QUADRIVALENT ADJUVANTED PF 65 YRS & OLDER (FLUAD) 07/08/2019    Pneumococcal 23 valent 12/29/2013    Pneumococcal Conjugate 13-Valent 01/14/2015    Zoster (SHINGRIX) Vaccine Recombinant 08/04/2022    Zoster (ZOSTAVAX) Vaccine 01/14/2015, 08/07/2015       Allergies: he is allergic to allopurinol.     TESTING:  1) Echocardiogram: 09/08/2022  Summary    * Markedly dilated left ventricle with severely reduced systolic function  (LVEDVi 109 mL/m2, ejection fraction 21%).    * Hypertrabeculated left ventricle with severe global hypokinesis.    * Mild aortic valve and moderate mitral valve regurgitation (details below).    * Wire in right-sided chambers with mild to moderate (2+) peri-device  tricuspid valve regurgitation.    * Dilated, poorly  responsive IVC.    * When visually compared with the prior study dated 05/29/2021, LV function  is less vigorous (previous EF 31%, now 21%; previous LVOT VTI 17 cm, now 11  cm); valvular regurgitation is less prominent.        Findings  Left Ventricle    The left ventricle is severely dilated. There is normal left ventricular  geometry.  Left ventricular systolic function is severely decreased with an  ejection fraction by Biplane Method of Discs of  21 %. Left ventricular  segmental wall motion is abnormal. Left ventricular diastolic filling  parameters are consistent with Grade II diastolic dysfunction (pseudonormal  pattern) with elevated left atrial pressure. Global hypokinesis of the left  ventricle. LVEDVi 109 mL/m2.  Right Ventricle    The right ventricular cavity size is normal in size. Normal right  ventricular systolic function.        Left Atrium    The left atrium is moderately dilated.     Right Atrium    The right atrium is normal in size.     Atrial Septum    No evidence of interatrial shunt by color Doppler.        Aortic Valve    The aortic valve is tricuspid. There is no aortic stenosis. There is mild  aortic regurgitation.     Pulmonary Valve    The pulmonic valve is structurally normal. There is trace to mild pulmonic  regurgitation.     Mitral Valve    The mitral valve is structurally normal. There is moderate mitral  regurgitation. PISAr 0.6 cm at 31.9 cm/s; VTI 141 cm with Vmax 4.5 m/s := ERO  0.16 cm2; RVol 23 mL/beat. E velocity 0.9 cm/s.     Tricuspid Valve    The tricuspid valve is structurally normal. Wire in right-sided chambers  with mild to moderate (2+) peri-device tricuspid valve regurgitation. No  significant pulmonary hypertension assessed today, with estimated right  ventricular systolic pressure of  32 mmHg.        Aorta    The aortic root is normal in size at 3.2 cm in diameter. The proximal  ascending aorta is not well visualized.     Inferior Vena Cava    The IVC is dilated with  < 50% respiratory variance consistent with  significantly elevated RA pressure of 15 mmHg.     Pericardium / Pleural Effusion    No pericardial effusion visualized.        Measurements  2D Measurements  ----------------------------------------------------------------------  Name                                 Value        Normal  ----------------------------------------------------------------------     Parasternal 2D  ----------------------------------------------------------------------   IVS Diastolic Thickness  (2D)                               0.80 cm     0.60-1.00   LVID Diastole (2D)                 7.00 cm     [id number]    LVIW Diastolic Thickness  (2D)                               0.60 cm     0.60-1.05   LVID Systole (2D)                  6.40 cm     2.50-4.00   LVOT Diameter                      2.20 cm  LA Dimension (2D)                  4.30 cm     3.00-4.10   Ao Root Diameter (2D)              3.20 cm     2.70-3.70      LV Ejection Fraction 2D  ----------------------------------------------------------------------  LV EF (BP MOD)                        21 %         52-72      Apical 2D Dimensions  ----------------------------------------------------------------------   RV Basal Diastolic  Dimension                          3.70 cm     2.50-4.10   LA Volume Index (BP A-L)        41.09 ml/m2   16.00-34.00   RA Area (4C)                     15.90 cm2       <=18.00  M-mode Measurements  ----------------------------------------------------------------------  Name                                 Value        Normal  ----------------------------------------------------------------------     M-Mode  ----------------------------------------------------------------------  TAPSE                              1.63 cm        >=1.60    2) Right Heart Catheterization: 09/10/2022     Normal PCWP.  CI 2.0 by Fick and 1.7 by Thermal.  Venous Lactate: 0.9.  Hemodynamics: RA 7. RV 39/4. PA 41/21/29. PCW  14/18/14. Sats: RA 59%, PA 62%, AO 95%, Room Air. Fick CO/CI 4.0/2.0, TD CO/CI 3.5/1.7. HR 80, BP (cuff) 115/72/89.  No complications.  Minimal EBL.  Pain-free, alert-oriented, hemodynamically-stable post-procedure.  Right brachial vein sheath removed and hemostasis achieved with manual compression.    PVR (MPAP - PCWP/CO): 3.75    CVP/PCWP Ratio (<0.63 is preferred): 0.5  PAPI (<1.0 is poor RV, 1.0 to 2.0 is intermediate RV function, >2.0 is good RV function): (PA systolic - PA diastolic) / CVP: [id number]  RVSWI (>300 is preferred):  1000 x (MPAP-CVP) x (CI/HR): 550    3) Left Heart Catheterization: N/A    4) VO2 Test: 10/14/2022  ECG:   Sinus rhythm, normal axis with rare PVC without changes of ischemia with exercise       HR % MPHR BP RQ VO2 % pred VO2   Baseline 88   113/77         AT 115       8.0 39%   Peak 121 83% 106/70 1.09 10.5 51%      Anaerobic threshold was achieved  VE/VCO2 slope: 42 is a steep slope (>35)  60 seconds HR recovery: 14 bpm is not concerning (< 6 bpm)     Good patient effort without evidence of ischemia.     FEV1  2.39 L (92% predicted)  FVC                                         3.27 L (96% predicted)  FEV1/FVC                               73%  MVV                                        101 (83% predicted.)  VE/MVV                                  0.46 (normal <0.8)  O2 sat at peak                         100%  Peak Instantaneous VE/VO2  51 (normal <40-50)  O2 Pulse Trajectory                From 7 to 8 mL O2/beat                Exercise test is consistent with cardiac limitations to exercise.      Exercise test is not consistent with pulmonary limitations to exercise.     Based on this cardiopulmonary exercise test, the cardiac prognosis is fair  due to:                 This is a maximal exercise test based on a maximal RQ of 1.09              Fair VO2 max 10.5 mL/kg/min (51% of predicted)              Weber functional class C. [Peak VO2: A >20, B  16-20, C 10-15, D <10]              Abnormal VE/VCO2 slope of 42              Decreased HR recovery    6) Chest CT or Chest X-ray: 09/08/2022  FINDINGS:  Please note that evaluation of the viscera and vasculature is limited in  the absence of IV contrast.     Chest:  The previously seen axillary nodes are significantly decreased. Evaluation  of the right axilla is slightly limited by streak artifact from the right  upper extremity. The left axillary nodes are upper normal measuring up to  1.2 x 0.8 cm. The largest right axillary node on the current examination  measures approximately 2.4 x 1.2 cm. Small nonenlarged mediastinal nodes  are noted. Coronary artery calcification is noted. A defibrillator is  present. Mild bilateral dependent opacities are noted likely atelectasis.  There is mild bronchiectasis in the lower lobes. Minimal patchy groundglass  opacities noted within the right lower lobe for example image. There is a 4  mm nodule in the right lower lobe,, image 97 series 5 A calcified granuloma  is noted within the right lung base.     Abdomen/Pelvis:  Bilateral renal cysts are noted. There is significant interval decrease in  size of the patient's abdominal and pelvic lymphadenopathy. A right  inguinal node measures 1.9 x  2.8 cm previously 2.9 x 3.8 cm. Other smaller  inguinal and iliac chain nodes are noted. Small nonenlarged retroperitoneal  nodes are noted. The spleen is not enlarged. The liver, adrenals, and  pancreas are unremarkable. The gallbladder is present and nondistended.  Degenerative changes of the spine are noted.     IMPRESSION:      1. Significant interval decrease in size of previously seen abdominal and  pelvic lymph nodes with small persistent mildly enlarged inguinal nodes and  small iliac chain nodes.  2. Bibasilar opacities in the dependent portions of the lungs, nonspecific.    7) PFTs: 11/22/2022    8) Abdominal Ultrasound: 09/10/2022  FINDINGS:     Gallbladder: Normal. No  gallstones. Negative sonographic Murphy's sign  reported.  Bile ducts: Nondilated. Common bile duct measures 0.4 cm.  Liver: Normal echotexture. No focal mass.  Spleen: Normal.  Pancreas: Visualized portions normal.  Aorta and IVC: Normal caliber.  Kidneys: Right kidney measures 15.4 cm. Left kidney measures 14.8 cm. There  is minimal fullness of the left renal collecting system. 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     IMPRESSION:      1. Multicystic kidneys bilaterally.  2. Minimal fullness of the left renal collecting system.  3. Otherwise unremarkable abdominal ultrasound.     9) Carotid Dopplers: 09/10/2022  FINDINGS:     RIGHT CAROTID:  Imaging demonstrates mild irregular heterogenous plaque in the carotid bulb  and bifurcation causing less than 50% stenosis.   Doppler is  normal.      LEFT CAROTID:  Imaging demonstrates mild irregular heterogenous plaque in the carotid bulb  and bifurcation causing less than 50% stenosis.    Doppler is normal.       PROXIMAL VERTEBRAL ARTERIES: Patent.  Antegrade flow.     MEASURED VELOCITIES (CM/S):     Right CCA:                 60  Right ICA (peak systolic):  39  Right ICA (end diastolic):   16  Right ECA:                 53     Left CCA:                 47  Left ICA (peak systolic):  38  Left ICA (end diastolic):   17  Left ECA:                 24        IMPRESSION:      1. Less than 50% stenosis right internal carotid artery.  2. Less than 50% stenosis left internal carotid artery.    10) Lower Extremity Dopplers: 09/10/2022  ECHNIQUE/FINDINGS: Noninvasive arterial studies of the lower extremities  were performed using segmental limb pressures, volume plethysmography, and  continuous wave Doppler  analysis.   Doppler segmental pressures from the  proximal thighs to the ankles are normal without any abnormal pressure  gradients.  Pulse volume recordings are normal from the  proximal thighs to  the metatarsal level.  Doppler waveform configurations are satisfactory at  the common femorals, popliteals, posterior tibials, and dorsalis pedis  bilaterally.   The resting ankle-brachial indices are 1.14 right and 1.13  left.     IMPRESSION:      Normal resting arterial study  of the lower extremities.    Labs:   Lab Results   Component Value Date    WBC 20.02 (H) 10/14/2022    HGB 12.5 10/14/2022    HCT 40.7 10/14/2022    PLT 139 (L) 10/14/2022    NA 138 10/14/2022    K 4.4 10/14/2022    CO2 25 10/14/2022    CL 100 10/14/2022    BUN 45.0 (H) 10/14/2022    CREAT 2.3 (H) 10/14/2022    EGFR 28.7 (A) 10/14/2022    GLU 246 (H) 10/14/2022    HGBA1C 5.9 (H) 08/06/2022    BNP 5,709 (H) 10/14/2022    TSH 0.78 01/08/2022    URICACID 12.6 (H) 03/19/2022    CHOL 145 08/06/2022    TRIG 71 08/06/2022    HDL 40 08/06/2022    LDL 91 08/06/2022    PROTEINUR 100= 2+ (A) 09/04/2022       Lab Results   Component Value Date    PROT 7.2 09/14/2022    ALB 3.4 (L) 09/14/2022    AST 14 09/14/2022    ALT 11 09/14/2022    ALKPHOS 80 09/14/2022    BILITOTAL 1.7 (H) 09/14/2022    BILIDIRECT 0.6 (H) 09/14/2022    BILIINDIRECT 1.1 (H) 09/14/2022    PREALB 21.7 09/10/2022    PT 16.9 (H) 09/10/2022    INR 1.5 (H) 09/10/2022    PTT 36 09/10/2022       Lab Results   Component Value Date    HCVAB Non-Reactive 09/10/2022    HEPBCOREAB Nonreactive 09/10/2022    HEPBSAB <3.31 09/10/2022    HEPBSAG Non-Reactive 09/10/2022    QUANTIFERON NEGATIVE 12/02/2016    CRP 4.5 (H) 09/10/2022    PSA 1.580 03/12/2022       If Indicated:  Dental Clearance: 11/24/2022      CONSULTATIONS:  1) Heart Failure Cardiologist: Dr. Doreen Beam    2) Cardiac Surgeon: Evelina Dun, MD   Today in clinic he could not recall prior LVAD education. Repeat education as well as contact with current LVAD patient being arranged. He appears to be an appropriate higher risk surgical candidate for DT LVAD. Worsening CKD is concerning and may limit his candidacy.     3)  Social Work: Diane Duke  Recommendation/Comments/Plans:   Patient appears to be a good transplant candidate from a psychosocial perspective.      SIPAT Score: 18 Recommend to list, with monitoring of identified risk factors     Strengths: Patient is knowledgeable concerning cardiac history. While initially hesitant concerning LVAD option, after processing, he expressed wanting to live and is grateful for the LVAD option. He is very close to his wife with whom he has been married for 31 years. Patient is a retired Pharmacist, hospital. He is adherent with dietary restrictions and management of medication. Patient and wife did not foresee any financial challenges related to LVAD.     Risk Factors: Patient's wife is very committed to supporting patient; however, she is the only identified caregiver as patient's family resides in other states and are unable to provide support. Although patient denies formal mental health history; he did disclose feeling depressed about declining cardiac health. He shared instances of past suicidal ideation and provided examples throughout his life in which feelings of frustration/hopelessness caused him to have these thoughts. He confirmed that while he has had thoughts of self harm, he did not have concrete plan or intent.  Of note, patient did score an 11  on the PHQ-9, reflecting moderate indicator for Depression.  He scored a 9 on the GAD-7, reflecting mild indicator of anxiety.  Patient open to talk therapy/counseling services. SW will provide resources.     Recommendations: Patient is interested in connecting with other VAD patients to discuss their experiences. Engage in therapy/counseling to address depression and anxiety.     The above recommendations have been shared with the patient and family.      4) Financial: Dulce Rodriguez-Brito  [employer] is [employer] POS and his spouse is the subscriber.     The patient is not employed and he receives social security disability benefits.      We discussed medical benefits for DT VAD.     The patient understood his benefits and had no questions at this time.     IMPRESSION:  From a financial point of view, the patient is an acceptable candidate for VAD implant.  Prior authorization is required turn around time is approximately 7 to 10 business days.    5) Nutrition: Rober Minion  ASPEN Malnutrition Guide: pt does not meet clinical criteria   Clinical Frailty Scale: 5 Mildly Frail [These people often have more evidence of slowing down and need help in high order IADLs (finances, transportation, heavy housework, medication management). Typically, mild frailty progressively impairs shopping and walking outside alone, meal preparation and housework.]  Fried Frailty Index: 3 Frail d/t exhaustion, decreased physical activity, and slow walking speed     Fried Frailty Index  Scores: 0= Non Frail, 1-2= Pre Frail, 3-5= Frail    Shrinking  0   Exhaustion  1   Physical Activity  1   Walking Speed  1 (7.23 seconds)   Grip Strength   0 (34%, R hand)   TOTAL SCORE  3 (Frail)     9) Palliative Care: Dr. Jorene Minors  Recommendations   1. Goals of Care:   2. LVAD/transplant evaluation:  - Pt has capacity for medical decision making and represents himself   - His wife [name] [name] is present for support  - Goals are to "get healthy" and prolong his life  - His LVAD education was over a month and he has a very limited understanding of the LVAD process, workup and potential complications while living with an LVAD  - He asked good questions, but demonstrated some lack of insight/understanding (whether the procedure would be outpatient, can be done today, etc)  - His wife is his only physical and psychosocial support system. Other family in [place] will not be able to move up to [place] per patient  - Had no prior AD, took a blank copy home        From a palliative perspective, a DT LVAD would align with the patient's goals of improved quality and longevity of life, but I am  unsure as to whether he has a good understanding of what getting and having an LVAD entails and whether he has adequate social support. For these reasons, he is a poor candidate for DT LVAD from palliative perspective.      He is very educated, thoughtful and intelligent. Would benefit from repeat LVAD education, social work followup. Should he still desire to proceed after repeat education and be able to identify adequate social support, I believe that he could be a good candidate.      11) VAD Education: Completed by Clarisse Gouge  AD Coordinator Concerns: None. 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), and CLL  VAD Implant Criteria:     The patient must meet the following 3 VAD implant Criteria:                                 1- Has NYHA Class IV chronic end-stage heart failure:  Yes   AND  2- Has a left ventricular ejection fraction less than or equal to 25%:  Yes  AND  3- IV inotrope dependent: No   If Not inotrope dependent then                                     MUST HAVE a Cardiac Index (CI) less than 2.2 L/min/m2, while not inotrope therapy Yes:    AND ALSO MEET ONE OF THE FOLLOWING:      Are on optimal medical management (OMM) based on current heart failure practice guidelines for at least 45 out of the last 60 days and are failing to respond  Yes. OR  Have advanced heart failure for at least 14 days and are dependent on an intra-aortic balloon pump (IABP) or similar temporary mechanical circulatory support for at least 7 days No.     The Patient does meet VAD implant criteria currently.        Probability of Recovery:          Oncology: Dr. Horald Chestnut  Rai stage IV chronic lymphocytic leukemia.  Was started on acalabrutinib in June 2023.  Restaging scans show improvement in lymph nodes.  White count is also decreased while maintaining normal platelets and hemoglobin.  Continue acalabrutinib unless there is a cardiac reason to stop it.  It would be okay from the hematology perspective to hold it if  needed.  The preference would be to continue as this is a lifelong medication.  Discussed with patient and his wife.     Congestive heart failure.  As per cardiology.  Plans for transfer to [facility] noted.     Risk of bleeding with BTK inhibitors has been discussed with her cardiology team.  The risk is higher with older agents like Imbruvica.

## 2022-12-15 ENCOUNTER — Telehealth: Payer: Self-pay

## 2022-12-15 NOTE — Telephone Encounter (Signed)
Pre VAD Nutrition Follow Up    Pt called today to discuss progress towards nutrition goals. Pt reports stable appetite since in person evaluation 11/23/22. Pt has been working on increasing his activity. He is now walking 10-15 minutes around his apartment building ~5x/week. This is a remarkable change from his initial report to RD where he estimated he could only walk 1/2 block before needing to stop and rest. We discussed the benefit of continued physical activity to improve frailty status prior to surgery.     Rober Minion RDN

## 2022-12-16 ENCOUNTER — Telehealth (INDEPENDENT_AMBULATORY_CARE_PROVIDER_SITE_OTHER): Payer: Self-pay

## 2022-12-16 ENCOUNTER — Telehealth: Payer: Self-pay

## 2022-12-16 NOTE — Telephone Encounter (Signed)
Patient called to report that he was advised by the transplant team to get a clearance by the Advanced Heart Failure team for the LVAD.  Date for this is pending.    He has an OV with Dr Judeth Horn but not until 01/08/23 which is too late.      Offered the first avail with Clotilde Dieter, NP on 12/30/22 at 10am in [place].

## 2022-12-16 NOTE — Telephone Encounter (Signed)
Advanced Heart Failure Social Work    SW called and spoke w/ pt to f/u re: psychosocial risk factors for

## 2022-12-16 NOTE — Committee Review (Signed)
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 Committee opted to defer patient for 1 month at this time for the following reasons:    Improvement in fraility  Repeat VAD education  Repeat social evaluation will primary and secondary caregiver to establish clear caregiver support plan  To have patient undergo a RHC and see if there is potential to have patient on milrinone

## 2022-12-16 NOTE — Telephone Encounter (Signed)
LVM for patient explaining that he has been deferred for 1 month for DT LVAD for the following reasons:    Committee opted to defer patient for 1 month at this time for the following reasons:     Improvement in fraility  Repeat VAD education  Repeat social evaluation will primary and secondary caregiver to establish clear caregiver support plan  To have patient undergo a RHC and see if there is potential to have patient on milrinone

## 2022-12-17 ENCOUNTER — Other Ambulatory Visit (INDEPENDENT_AMBULATORY_CARE_PROVIDER_SITE_OTHER): Payer: Self-pay | Admitting: Nurse Practitioner

## 2022-12-17 ENCOUNTER — Telehealth (INDEPENDENT_AMBULATORY_CARE_PROVIDER_SITE_OTHER): Payer: Self-pay

## 2022-12-17 DIAGNOSIS — I428 Other cardiomyopathies: Secondary | ICD-10-CM

## 2022-12-17 DIAGNOSIS — I5023 Acute on chronic systolic (congestive) heart failure: Secondary | ICD-10-CM

## 2022-12-17 DIAGNOSIS — I5022 Chronic systolic (congestive) heart failure: Secondary | ICD-10-CM

## 2022-12-17 NOTE — Telephone Encounter (Signed)
Attempted to call patient as follow up after transplant committee review yesterday, per Dr Judeth Horn would like for patient to have Ethel in next week or so to determine need for milrinone, and recommendation for cardiac rehab to improve frailty in preparation for advanced therapies/ potential surgery.  There was no answer at this time, left VM for patient to return call.

## 2022-12-17 NOTE — Telephone Encounter (Signed)
Pt called back after missing Kayla , RN's call.  I reviewed chart and TT Kayla, RN.  She asked that I transfer pt to her #.  Call transferred.

## 2022-12-17 NOTE — Telephone Encounter (Signed)
Patient returned my call, we reviewed deferral for LVAD for 1 month and items to complete in the meantime.  Referral placed for cardiac rehab at [facility], patient is agreeable to this program and willing to complete this here at [facility]. Provided rehab phone number for patient to call to set up consult.  Will arrange for RHC to evaluate for possible milrinone initiation, patient voiced understanding with this plan and is agreeable to proceed within approx the next week.  I relayed that [place] AHF team will be reaching out to discuss revisiting VAD education to ensure understanding, he v/u and will look out for another call form Adam, RN/ team.    He has no additional questions at this time.

## 2022-12-20 ENCOUNTER — Inpatient Hospital Stay
Admission: EM | Admit: 2022-12-20 | Discharge: 2022-12-21 | DRG: 308 | Disposition: A | Discharge status: Other Acute Inpatient Hospital | Payer: Commercial Managed Care - POS | Attending: Internal Medicine | Admitting: Internal Medicine

## 2022-12-20 ENCOUNTER — Emergency Department: Payer: Commercial Managed Care - POS

## 2022-12-20 DIAGNOSIS — E785 Hyperlipidemia, unspecified: Secondary | ICD-10-CM | POA: Diagnosis present

## 2022-12-20 DIAGNOSIS — I255 Ischemic cardiomyopathy: Secondary | ICD-10-CM | POA: Diagnosis present

## 2022-12-20 DIAGNOSIS — R0902 Hypoxemia: Secondary | ICD-10-CM | POA: Diagnosis present

## 2022-12-20 DIAGNOSIS — Z955 Presence of coronary angioplasty implant and graft: Secondary | ICD-10-CM

## 2022-12-20 DIAGNOSIS — Z8711 Personal history of peptic ulcer disease: Secondary | ICD-10-CM

## 2022-12-20 DIAGNOSIS — I081 Rheumatic disorders of both mitral and tricuspid valves: Secondary | ICD-10-CM | POA: Diagnosis present

## 2022-12-20 DIAGNOSIS — N179 Acute kidney failure, unspecified: Secondary | ICD-10-CM | POA: Diagnosis present

## 2022-12-20 DIAGNOSIS — R570 Cardiogenic shock: Secondary | ICD-10-CM | POA: Diagnosis present

## 2022-12-20 DIAGNOSIS — C9110 Chronic lymphocytic leukemia of B-cell type not having achieved remission: Secondary | ICD-10-CM | POA: Diagnosis present

## 2022-12-20 DIAGNOSIS — Z7982 Long term (current) use of aspirin: Secondary | ICD-10-CM

## 2022-12-20 DIAGNOSIS — Z8249 Family history of ischemic heart disease and other diseases of the circulatory system: Secondary | ICD-10-CM

## 2022-12-20 DIAGNOSIS — E872 Acidosis, unspecified: Secondary | ICD-10-CM | POA: Diagnosis not present

## 2022-12-20 DIAGNOSIS — Z79899 Other long term (current) drug therapy: Secondary | ICD-10-CM

## 2022-12-20 DIAGNOSIS — E875 Hyperkalemia: Secondary | ICD-10-CM | POA: Diagnosis not present

## 2022-12-20 DIAGNOSIS — I2510 Atherosclerotic heart disease of native coronary artery without angina pectoris: Secondary | ICD-10-CM | POA: Diagnosis present

## 2022-12-20 DIAGNOSIS — Z833 Family history of diabetes mellitus: Secondary | ICD-10-CM

## 2022-12-20 DIAGNOSIS — Z7901 Long term (current) use of anticoagulants: Secondary | ICD-10-CM

## 2022-12-20 DIAGNOSIS — N189 Chronic kidney disease, unspecified: Secondary | ICD-10-CM

## 2022-12-20 DIAGNOSIS — I471 Supraventricular tachycardia, unspecified: Principal | ICD-10-CM | POA: Diagnosis present

## 2022-12-20 DIAGNOSIS — Z86718 Personal history of other venous thrombosis and embolism: Secondary | ICD-10-CM

## 2022-12-20 DIAGNOSIS — E1122 Type 2 diabetes mellitus with diabetic chronic kidney disease: Secondary | ICD-10-CM | POA: Diagnosis present

## 2022-12-20 DIAGNOSIS — J449 Chronic obstructive pulmonary disease, unspecified: Secondary | ICD-10-CM | POA: Diagnosis present

## 2022-12-20 DIAGNOSIS — N184 Chronic kidney disease, stage 4 (severe): Secondary | ICD-10-CM | POA: Diagnosis present

## 2022-12-20 DIAGNOSIS — Z95810 Presence of automatic (implantable) cardiac defibrillator: Secondary | ICD-10-CM

## 2022-12-20 DIAGNOSIS — I5023 Acute on chronic systolic (congestive) heart failure: Secondary | ICD-10-CM | POA: Diagnosis present

## 2022-12-20 DIAGNOSIS — I252 Old myocardial infarction: Secondary | ICD-10-CM

## 2022-12-20 DIAGNOSIS — I130 Hypertensive heart and chronic kidney disease with heart failure and stage 1 through stage 4 chronic kidney disease, or unspecified chronic kidney disease: Secondary | ICD-10-CM | POA: Diagnosis present

## 2022-12-20 DIAGNOSIS — Z7984 Long term (current) use of oral hypoglycemic drugs: Secondary | ICD-10-CM

## 2022-12-20 LAB — CELL MORPHOLOGY
Cell Morphology: ABNORMAL — AB
Platelet Estimate: NORMAL

## 2022-12-20 LAB — CBC AND DIFFERENTIAL
Absolute NRBC: 0.08 10*3/uL — ABNORMAL HIGH (ref 0.00–0.00)
Basophils Absolute Automated: 0.02 10*3/uL (ref 0.00–0.08)
Basophils Automated: 0.2 %
Eosinophils Absolute Automated: 0.03 10*3/uL (ref 0.00–0.44)
Eosinophils Automated: 0.2 %
Hematocrit: 45.2 % (ref 37.6–49.6)
Hgb: 14.6 g/dL (ref 12.5–17.1)
Immature Granulocytes Absolute: 0.07 10*3/uL (ref 0.00–0.07)
Immature Granulocytes: 0.6 %
Instrument Absolute Neutrophil Count: 4.21 10*3/uL (ref 1.10–6.33)
Lymphocytes Absolute Automated: 8.09 10*3/uL — ABNORMAL HIGH (ref 0.42–3.22)
Lymphocytes Automated: 64.2 %
MCH: 33.1 pg (ref 25.1–33.5)
MCHC: 32.3 g/dL (ref 31.5–35.8)
MCV: 102.5 fL — ABNORMAL HIGH (ref 78.0–96.0)
MPV: 12.5 fL (ref 8.9–12.5)
Monocytes Absolute Automated: 0.18 10*3/uL — ABNORMAL LOW (ref 0.21–0.85)
Monocytes: 1.4 %
Neutrophils Absolute: 4.21 10*3/uL (ref 1.10–6.33)
Neutrophils: 33.4 %
Nucleated RBC: 0.6 /100 WBC — ABNORMAL HIGH (ref 0.0–0.0)
Platelets: 161 10*3/uL (ref 142–346)
RBC: 4.41 10*6/uL (ref 4.20–5.90)
RDW: 15 % (ref 11–15)
WBC: 12.6 10*3/uL — ABNORMAL HIGH (ref 3.10–9.50)

## 2022-12-20 LAB — COMPREHENSIVE METABOLIC PANEL
ALT: 40 U/L (ref 0–55)
AST (SGOT): 38 U/L (ref 5–41)
Albumin/Globulin Ratio: 1.2 (ref 0.9–2.2)
Albumin: 4.1 g/dL (ref 3.5–5.0)
Alkaline Phosphatase: 115 U/L (ref 37–117)
Anion Gap: 19 — ABNORMAL HIGH (ref 5.0–15.0)
BUN: 48 mg/dL — ABNORMAL HIGH (ref 9.0–28.0)
Bilirubin, Total: 3.2 mg/dL — ABNORMAL HIGH (ref 0.2–1.2)
CO2: 17 mEq/L (ref 17–29)
Calcium: 10.1 mg/dL (ref 7.9–10.2)
Chloride: 106 mEq/L (ref 99–111)
Creatinine: 3 mg/dL — ABNORMAL HIGH (ref 0.5–1.5)
Globulin: 3.3 g/dL (ref 2.0–3.6)
Glucose: 104 mg/dL — ABNORMAL HIGH (ref 70–100)
Potassium: 4.5 mEq/L (ref 3.5–5.3)
Protein, Total: 7.4 g/dL (ref 6.0–8.3)
Sodium: 142 mEq/L (ref 135–145)
eGFR: 20.9 mL/min/{1.73_m2} — AB (ref >=60)

## 2022-12-20 LAB — NT-PROBNP: NT-proBNP: 26524 pg/mL — ABNORMAL HIGH (ref 0–450)

## 2022-12-20 LAB — CHEM 8 POCT
BUN I-Stat: 45 mg/dL — ABNORMAL HIGH (ref 8–26)
Chloride I-Stat: 106 mEq/L (ref 98–109)
Creatinine I-Stat: 3.2 mg/dL — ABNORMAL HIGH (ref 0.6–1.3)
Glucose I-Stat: 104 mg/dL — ABNORMAL HIGH (ref 70–100)
Hematocrit I-Stat: 49 % (ref 42.0–52.0)
Hemoglobin I-Stat: 16.7 g/dL (ref 13.0–17.0)
Potassium I-Stat: 4.4 mEq/L (ref 3.5–4.9)
Sodium I-Stat: 140 mEq/L (ref 138–146)
eGFR: 19.1 mL/min/{1.73_m2} — AB (ref >=60)
i-STAT CO2: 21 mEq/L — ABNORMAL LOW (ref 24–29)
i-STAT Calcium Ionized: 2.3 mEq/L (ref 2.24–2.64)

## 2022-12-20 LAB — HIGH SENSITIVITY TROPONIN-I: hs Troponin-I: 192.4 ng/L — CR

## 2022-12-20 LAB — HIGH SENSITIVITY TROPONIN-I WITH DELTA
hs Troponin-I Delta: -9.4 ng/L
hs Troponin-I: 183 ng/L — CR

## 2022-12-20 LAB — MAGNESIUM: Magnesium: 2.9 mg/dL — ABNORMAL HIGH (ref 1.6–2.6)

## 2022-12-20 MED ORDER — MIDAZOLAM HCL 1 MG/ML IJ SOLN (WRAP)
INTRAMUSCULAR | Status: AC
Start: 2022-12-20 — End: 2022-12-20
  Administered 2022-12-20: 2 mg
  Filled 2022-12-20: qty 5

## 2022-12-20 MED ORDER — MAGNESIUM SULFATE IN D5W 1-5 GM/100ML-% IV SOLN
1.0000 g | Freq: Once | INTRAVENOUS | Status: AC
Start: 2022-12-20 — End: 2022-12-20
  Administered 2022-12-20: 1 g via INTRAVENOUS
  Filled 2022-12-20: qty 100

## 2022-12-20 MED ORDER — MIDAZOLAM HCL 1 MG/ML IJ SOLN (WRAP)
2.0000 mg | Freq: Once | INTRAMUSCULAR | Status: AC
Start: 2022-12-20 — End: 2022-12-20

## 2022-12-20 MED ORDER — ADENOSINE 6 MG/2ML IV SOLN
INTRAVENOUS | Status: AC
Start: 2022-12-20 — End: 2022-12-20
  Administered 2022-12-20: 6 mg
  Filled 2022-12-20: qty 6

## 2022-12-20 MED ORDER — ONDANSETRON HCL 4 MG/2ML IJ SOLN
4.0000 mg | Freq: Once | INTRAMUSCULAR | Status: AC
Start: 2022-12-20 — End: 2022-12-20
  Administered 2022-12-20: 4 mg via INTRAVENOUS
  Filled 2022-12-20: qty 2

## 2022-12-20 MED ORDER — ADENOSINE 6 MG/2ML IV SOLN
6.0000 mg | Freq: Once | INTRAVENOUS | Status: AC
Start: 2022-12-20 — End: 2022-12-20

## 2022-12-20 MED ORDER — ADENOSINE 6 MG/2ML IV SOLN
12.0000 mg | Freq: Once | INTRAVENOUS | Status: DC
Start: 2022-12-20 — End: 2022-12-21

## 2022-12-20 MED ORDER — MORPHINE SULFATE 4 MG/ML IJ/IV SOLN (WRAP)
4.0000 mg | Freq: Once | Status: AC
Start: 2022-12-20 — End: 2022-12-20
  Administered 2022-12-20: 4 mg via INTRAVENOUS
  Filled 2022-12-20: qty 1

## 2022-12-20 NOTE — ED to IP RN Note (Signed)
[facility] [place]  ED NURSING NOTE FOR THE RECEIVING INPATIENT NURSE   ED NURSE [place] [phone]   ED CHARGE RN Lattie Haw   ADMISSION INFORMATION   Bora Ursini is a 77 y.o. male admitted with an ED diagnosis of:    1. SVT (supraventricular tachycardia)    2. Acute renal failure superimposed on chronic kidney disease, unspecified acute renal failure type, unspecified CKD stage         Isolation: None   Allergies: Allopurinol   Holding Orders confirmed? No   Belongings Documented? No   Home medications sent to pharmacy confirmed? N/A   NURSING CARE   Patient Comes From:   Mental Status: Home Independent  alert and oriented   ADL: Needs assistance with ADLs   Ambulation: mild difficulty   Pertinent Information  and Safety Concerns:       please call RN     CT / NIH   CT Head ordered on this patient?  No   NIH/Dysphagia assessment done prior to admission? No   VITAL SIGNS (at the time of this note)      Vitals:    12/20/22 2305   BP: 104/84   Pulse: 97   Resp: 22   Temp: 97.5   SpO2: 93%     Pain Score: 0-No pain (12/20/22 2304)

## 2022-12-20 NOTE — ED Triage Notes (Signed)
C/o chest pain and sob starting worse today.  Has hx of CHF.  Has Defib and pacemaker.

## 2022-12-20 NOTE — ED Notes (Signed)
Pt becoming more unconscious, unable to gain IV access, MD Lee at bedside, decided to cardiovert at 120J. Patient momentarily converted and is now back in SVT.

## 2022-12-21 ENCOUNTER — Inpatient Hospital Stay
Admission: AD | Admit: 2022-12-21 | Discharge: 2023-01-04 | DRG: 286 | Disposition: A | Discharge status: Home Health | Payer: Commercial Managed Care - POS | Source: Other Acute Inpatient Hospital | Attending: Internal Medicine | Admitting: Internal Medicine

## 2022-12-21 ENCOUNTER — Encounter: Payer: Self-pay | Admitting: Residents

## 2022-12-21 ENCOUNTER — Encounter (HOSPITAL_COMMUNITY): Payer: Commercial Managed Care - POS

## 2022-12-21 ENCOUNTER — Inpatient Hospital Stay: Payer: Commercial Managed Care - POS

## 2022-12-21 DIAGNOSIS — J449 Chronic obstructive pulmonary disease, unspecified: Secondary | ICD-10-CM | POA: Diagnosis present

## 2022-12-21 DIAGNOSIS — I130 Hypertensive heart and chronic kidney disease with heart failure and stage 1 through stage 4 chronic kidney disease, or unspecified chronic kidney disease: Principal | ICD-10-CM | POA: Diagnosis present

## 2022-12-21 DIAGNOSIS — D508 Other iron deficiency anemias: Secondary | ICD-10-CM | POA: Diagnosis present

## 2022-12-21 DIAGNOSIS — Z7982 Long term (current) use of aspirin: Secondary | ICD-10-CM

## 2022-12-21 DIAGNOSIS — J9601 Acute respiratory failure with hypoxia: Secondary | ICD-10-CM | POA: Diagnosis present

## 2022-12-21 DIAGNOSIS — I509 Heart failure, unspecified: Principal | ICD-10-CM

## 2022-12-21 DIAGNOSIS — Z95810 Presence of automatic (implantable) cardiac defibrillator: Secondary | ICD-10-CM

## 2022-12-21 DIAGNOSIS — R000 Tachycardia, unspecified: Secondary | ICD-10-CM

## 2022-12-21 DIAGNOSIS — F32A Depression, unspecified: Secondary | ICD-10-CM | POA: Diagnosis present

## 2022-12-21 DIAGNOSIS — R0600 Dyspnea, unspecified: Secondary | ICD-10-CM

## 2022-12-21 DIAGNOSIS — Z79899 Other long term (current) drug therapy: Secondary | ICD-10-CM

## 2022-12-21 DIAGNOSIS — D6959 Other secondary thrombocytopenia: Secondary | ICD-10-CM | POA: Diagnosis present

## 2022-12-21 DIAGNOSIS — D631 Anemia in chronic kidney disease: Secondary | ICD-10-CM | POA: Diagnosis present

## 2022-12-21 DIAGNOSIS — Z6828 Body mass index (BMI) 28.0-28.9, adult: Secondary | ICD-10-CM

## 2022-12-21 DIAGNOSIS — I50813 Acute on chronic right heart failure: Secondary | ICD-10-CM | POA: Insufficient documentation

## 2022-12-21 DIAGNOSIS — I5023 Acute on chronic systolic (congestive) heart failure: Secondary | ICD-10-CM | POA: Diagnosis present

## 2022-12-21 DIAGNOSIS — I5082 Biventricular heart failure: Secondary | ICD-10-CM | POA: Diagnosis present

## 2022-12-21 DIAGNOSIS — Z7984 Long term (current) use of oral hypoglycemic drugs: Secondary | ICD-10-CM

## 2022-12-21 DIAGNOSIS — N184 Chronic kidney disease, stage 4 (severe): Secondary | ICD-10-CM | POA: Diagnosis present

## 2022-12-21 DIAGNOSIS — Z608 Other problems related to social environment: Secondary | ICD-10-CM | POA: Diagnosis present

## 2022-12-21 DIAGNOSIS — Z452 Encounter for adjustment and management of vascular access device: Secondary | ICD-10-CM

## 2022-12-21 DIAGNOSIS — Z62810 Personal history of physical and sexual abuse in childhood: Secondary | ICD-10-CM

## 2022-12-21 DIAGNOSIS — R570 Cardiogenic shock: Secondary | ICD-10-CM | POA: Diagnosis present

## 2022-12-21 DIAGNOSIS — E663 Overweight: Secondary | ICD-10-CM | POA: Diagnosis present

## 2022-12-21 DIAGNOSIS — Z794 Long term (current) use of insulin: Secondary | ICD-10-CM

## 2022-12-21 DIAGNOSIS — E1122 Type 2 diabetes mellitus with diabetic chronic kidney disease: Secondary | ICD-10-CM | POA: Diagnosis present

## 2022-12-21 DIAGNOSIS — Z86718 Personal history of other venous thrombosis and embolism: Secondary | ICD-10-CM

## 2022-12-21 DIAGNOSIS — I255 Ischemic cardiomyopathy: Secondary | ICD-10-CM

## 2022-12-21 DIAGNOSIS — Z7901 Long term (current) use of anticoagulants: Secondary | ICD-10-CM

## 2022-12-21 DIAGNOSIS — I428 Other cardiomyopathies: Secondary | ICD-10-CM | POA: Diagnosis present

## 2022-12-21 DIAGNOSIS — N179 Acute kidney failure, unspecified: Secondary | ICD-10-CM | POA: Diagnosis present

## 2022-12-21 DIAGNOSIS — E872 Acidosis, unspecified: Secondary | ICD-10-CM | POA: Diagnosis present

## 2022-12-21 DIAGNOSIS — I471 Supraventricular tachycardia, unspecified: Secondary | ICD-10-CM

## 2022-12-21 DIAGNOSIS — K7200 Acute and subacute hepatic failure without coma: Secondary | ICD-10-CM | POA: Diagnosis present

## 2022-12-21 DIAGNOSIS — I5084 End stage heart failure: Secondary | ICD-10-CM | POA: Diagnosis present

## 2022-12-21 DIAGNOSIS — E7800 Pure hypercholesterolemia, unspecified: Secondary | ICD-10-CM | POA: Diagnosis present

## 2022-12-21 DIAGNOSIS — I5022 Chronic systolic (congestive) heart failure: Secondary | ICD-10-CM

## 2022-12-21 DIAGNOSIS — E871 Hypo-osmolality and hyponatremia: Secondary | ICD-10-CM | POA: Diagnosis not present

## 2022-12-21 DIAGNOSIS — K761 Chronic passive congestion of liver: Secondary | ICD-10-CM | POA: Diagnosis present

## 2022-12-21 DIAGNOSIS — I2510 Atherosclerotic heart disease of native coronary artery without angina pectoris: Secondary | ICD-10-CM | POA: Diagnosis present

## 2022-12-21 DIAGNOSIS — Z955 Presence of coronary angioplasty implant and graft: Secondary | ICD-10-CM

## 2022-12-21 DIAGNOSIS — C9110 Chronic lymphocytic leukemia of B-cell type not having achieved remission: Secondary | ICD-10-CM | POA: Diagnosis present

## 2022-12-21 DIAGNOSIS — E875 Hyperkalemia: Secondary | ICD-10-CM | POA: Diagnosis present

## 2022-12-21 DIAGNOSIS — D539 Nutritional anemia, unspecified: Secondary | ICD-10-CM | POA: Diagnosis present

## 2022-12-21 LAB — MAN DIFF ONLY
Atypical Lymphocytes %: 1 %
Atypical Lymphocytes Absolute: 0.13 10*3/uL — ABNORMAL HIGH (ref 0.00–0.00)
Band Neutrophils Absolute: 0.27 10*3/uL (ref 0.00–1.00)
Band Neutrophils: 2 %
Basophils Absolute Manual: 0 10*3/uL (ref 0.00–0.08)
Basophils Manual: 0 %
Eosinophils Absolute Manual: 0 10*3/uL (ref 0.00–0.44)
Eosinophils Manual: 0 %
Lymphocytes Absolute Manual: 7.42 10*3/uL — ABNORMAL HIGH (ref 0.42–3.22)
Lymphocytes Manual: 55 %
Monocytes Absolute: 0.81 10*3/uL (ref 0.21–0.85)
Monocytes Manual: 6 %
Neutrophils Absolute Manual: 4.86 10*3/uL (ref 1.10–6.33)
Segmented Neutrophils: 36 %

## 2022-12-21 LAB — COMPREHENSIVE METABOLIC PANEL
ALT: 73 U/L — ABNORMAL HIGH (ref 0–55)
AST (SGOT): 73 U/L — ABNORMAL HIGH (ref 5–41)
Albumin/Globulin Ratio: 1.2 (ref 0.9–2.2)
Albumin: 3.7 g/dL (ref 3.5–5.0)
Alkaline Phosphatase: 103 U/L (ref 37–117)
Anion Gap: 16 — ABNORMAL HIGH (ref 5.0–15.0)
BUN: 51 mg/dL — ABNORMAL HIGH (ref 9.0–28.0)
Bilirubin, Total: 3.6 mg/dL — ABNORMAL HIGH (ref 0.2–1.2)
CO2: 17 mEq/L (ref 17–29)
Calcium: 9.6 mg/dL (ref 7.9–10.2)
Chloride: 108 mEq/L (ref 99–111)
Creatinine: 3.3 mg/dL — ABNORMAL HIGH (ref 0.5–1.5)
Globulin: 3 g/dL (ref 2.0–3.6)
Glucose: 75 mg/dL (ref 70–100)
Potassium: 5.8 mEq/L — ABNORMAL HIGH (ref 3.5–5.3)
Protein, Total: 6.7 g/dL (ref 6.0–8.3)
Sodium: 141 mEq/L (ref 135–145)
eGFR: 18.6 mL/min/{1.73_m2} — AB (ref >=60)

## 2022-12-21 LAB — CBC AND DIFFERENTIAL
Absolute NRBC: 0.1 10*3/uL — ABNORMAL HIGH (ref 0.00–0.00)
Hematocrit: 44.2 % (ref 37.6–49.6)
Hgb: 13.9 g/dL (ref 12.5–17.1)
Instrument Absolute Neutrophil Count: 4.53 10*3/uL (ref 1.10–6.33)
MCH: 32.2 pg (ref 25.1–33.5)
MCHC: 31.4 g/dL — ABNORMAL LOW (ref 31.5–35.8)
MCV: 102.3 fL — ABNORMAL HIGH (ref 78.0–96.0)
MPV: 12.6 fL — ABNORMAL HIGH (ref 8.9–12.5)
Nucleated RBC: 0.7 /100 WBC — ABNORMAL HIGH (ref 0.0–0.0)
Platelets: 159 10*3/uL (ref 142–346)
RBC: 4.32 10*6/uL (ref 4.20–5.90)
RDW: 16 % — ABNORMAL HIGH (ref 11–15)
WBC: 13.49 10*3/uL — ABNORMAL HIGH (ref 3.10–9.50)

## 2022-12-21 LAB — CBC
Absolute NRBC: 0.07 10*3/uL — ABNORMAL HIGH (ref 0.00–0.00)
Hematocrit: 38.2 % (ref 37.6–49.6)
Hgb: 12.1 g/dL — ABNORMAL LOW (ref 12.5–17.1)
MCH: 33.1 pg (ref 25.1–33.5)
MCHC: 31.7 g/dL (ref 31.5–35.8)
MCV: 104.4 fL — ABNORMAL HIGH (ref 78.0–96.0)
MPV: 12.9 fL — ABNORMAL HIGH (ref 8.9–12.5)
Nucleated RBC: 0.5 /100 WBC — ABNORMAL HIGH (ref 0.0–0.0)
Platelets: 110 10*3/uL — ABNORMAL LOW (ref 142–346)
RBC: 3.66 10*6/uL — ABNORMAL LOW (ref 4.20–5.90)
RDW: 15 % (ref 11–15)
WBC: 13.69 10*3/uL — ABNORMAL HIGH (ref 3.10–9.50)

## 2022-12-21 LAB — BASIC METABOLIC PANEL
Anion Gap: 14 (ref 5.0–15.0)
Anion Gap: 13 (ref 5.0–15.0)
Anion Gap: 17 — ABNORMAL HIGH (ref 5.0–15.0)
BUN: 53 mg/dL — ABNORMAL HIGH (ref 9.0–28.0)
BUN: 60 mg/dL — ABNORMAL HIGH (ref 9.0–28.0)
BUN: 61 mg/dL — ABNORMAL HIGH (ref 9.0–28.0)
CO2: 18 mEq/L (ref 17–29)
CO2: 19 mEq/L (ref 17–29)
CO2: 17 mEq/L (ref 17–29)
Calcium: 9.4 mg/dL (ref 7.9–10.2)
Calcium: 8.9 mg/dL (ref 7.9–10.2)
Calcium: 8.7 mg/dL (ref 7.9–10.2)
Chloride: 105 mEq/L (ref 99–111)
Chloride: 103 mEq/L (ref 99–111)
Chloride: 101 mEq/L (ref 99–111)
Creatinine: 3.9 mg/dL — ABNORMAL HIGH (ref 0.5–1.5)
Creatinine: 4.1 mg/dL — ABNORMAL HIGH (ref 0.5–1.5)
Creatinine: 4.2 mg/dL — ABNORMAL HIGH (ref 0.5–1.5)
Glucose: 97 mg/dL (ref 70–100)
Glucose: 167 mg/dL — ABNORMAL HIGH (ref 70–100)
Glucose: 199 mg/dL — ABNORMAL HIGH (ref 70–100)
Potassium: 5.6 mEq/L — ABNORMAL HIGH (ref 3.5–5.3)
Potassium: 4.8 mEq/L (ref 3.5–5.3)
Potassium: 4.9 mEq/L (ref 3.5–5.3)
Sodium: 137 mEq/L (ref 135–145)
Sodium: 135 mEq/L (ref 135–145)
Sodium: 135 mEq/L (ref 135–145)
eGFR: 15.2 mL/min/{1.73_m2} — AB (ref >=60)
eGFR: 14.3 mL/min/{1.73_m2} — AB (ref >=60)
eGFR: 13.9 mL/min/{1.73_m2} — AB (ref >=60)

## 2022-12-21 LAB — ECHO ADULT TTE LIMITED
BP Mod LV Ejection Fraction: 10.5
IVS Diastolic Thickness (2D): 0.7
LA Dimension (2D): 4.8
LVID diastole (2D): 6.8
LVID systole (2D): 6.4
LVID systole (2D): 6.6
RV Function: DECREASED
RV Systolic Pressure: 42
RV Systolic Pressure: 44.24
Summary: DECREASED
TAPSE: 1.52
Tricuspid Valve Findings: NORMAL

## 2022-12-21 LAB — HEPATIC FUNCTION PANEL (LFT)
ALT: 425 U/L — ABNORMAL HIGH (ref 0–55)
AST (SGOT): 426 U/L — ABNORMAL HIGH (ref 5–41)
Albumin/Globulin Ratio: 1 (ref 0.9–2.2)
Albumin: 3.1 g/dL — ABNORMAL LOW (ref 3.5–5.0)
Alkaline Phosphatase: 81 U/L (ref 37–117)
Bilirubin Direct: 0.9 mg/dL — ABNORMAL HIGH (ref 0.0–0.5)
Bilirubin Indirect: 0.9 mg/dL (ref 0.2–1.0)
Bilirubin, Total: 1.8 mg/dL — ABNORMAL HIGH (ref 0.2–1.2)
Globulin: 3 g/dL (ref 2.0–3.6)
Protein, Total: 6.1 g/dL (ref 6.0–8.3)

## 2022-12-21 LAB — PHOSPHORUS
Phosphorus: 7.4 mg/dL — ABNORMAL HIGH (ref 2.3–4.7)
Phosphorus: 7.2 mg/dL — ABNORMAL HIGH (ref 2.3–4.7)

## 2022-12-21 LAB — LACTIC ACID
Lactic Acid: 2.9 mmol/L — ABNORMAL HIGH (ref 0.2–2.0)
Lactic Acid: 1.8 mmol/L (ref 0.2–2.0)
Lactic Acid: 2.2 mmol/L — ABNORMAL HIGH (ref 0.2–2.0)

## 2022-12-21 LAB — COVID-19 (SARS-COV-2) & INFLUENZA  A/B, NAA (ROCHE LIAT)
Influenza A: NOT DETECTED
Influenza B: NOT DETECTED
SARS-CoV-2 Overall Result: NOT DETECTED

## 2022-12-21 LAB — MAGNESIUM
Magnesium: 3.2 mg/dL — ABNORMAL HIGH (ref 1.6–2.6)
Magnesium: 2.8 mg/dL — ABNORMAL HIGH (ref 1.6–2.6)
Magnesium: 2.9 mg/dL — ABNORMAL HIGH (ref 1.6–2.6)

## 2022-12-21 LAB — CELL MORPHOLOGY
Cell Morphology: ABNORMAL — AB
Platelet Estimate: NORMAL

## 2022-12-21 LAB — WHOLE BLOOD GLUCOSE POCT
Whole Blood Glucose POCT: 101 mg/dL — ABNORMAL HIGH (ref 70–100)
Whole Blood Glucose POCT: 181 mg/dL — ABNORMAL HIGH (ref 70–100)

## 2022-12-21 LAB — TSH: TSH: 2.18 u[IU]/mL (ref 0.35–4.94)

## 2022-12-21 MED ORDER — APIXABAN 5 MG PO TABS
5.0000 mg | ORAL_TABLET | Freq: Two times a day (BID) | ORAL | Status: DC
Start: 2022-12-22 — End: 2022-12-21

## 2022-12-21 MED ORDER — SODIUM BICARBONATE 8.4 % IV SOLN
60.0000 mL/h | INTRAVENOUS | Status: DC
Start: 2022-12-21 — End: 2022-12-21
  Administered 2022-12-21: 60 mL/h via INTRAVENOUS
  Filled 2022-12-21: qty 1000

## 2022-12-21 MED ORDER — ASPIRIN 81 MG PO TBEC
81.0000 mg | DELAYED_RELEASE_TABLET | Freq: Every day | ORAL | Status: DC
Start: 2022-12-22 — End: 2023-01-04
  Administered 2022-12-22 – 2023-01-04 (×14): 81 mg via ORAL
  Filled 2022-12-21 (×14): qty 1

## 2022-12-21 MED ORDER — FUROSEMIDE 10 MG/ML IJ SOLN
80.0000 mg | Freq: Once | INTRAMUSCULAR | Status: AC
Start: 2022-12-21 — End: 2022-12-21
  Administered 2022-12-21: 80 mg via INTRAVENOUS
  Filled 2022-12-21: qty 8

## 2022-12-21 MED ORDER — TORSEMIDE 20 MG PO TABS
40.0000 mg | ORAL_TABLET | Freq: Two times a day (BID) | ORAL | Status: DC
Start: 2022-12-21 — End: 2022-12-21
  Filled 2022-12-21 (×4): qty 2

## 2022-12-21 MED ORDER — ATORVASTATIN CALCIUM 20 MG PO TABS
40.0000 mg | ORAL_TABLET | Freq: Every evening | ORAL | Status: DC
Start: 2022-12-21 — End: 2022-12-21

## 2022-12-21 MED ORDER — ISOSORBIDE DINITRATE 10 MG PO TABS
10.0000 mg | ORAL_TABLET | Freq: Three times a day (TID) | ORAL | Status: DC
Start: 2022-12-22 — End: 2022-12-24
  Administered 2022-12-22 – 2022-12-24 (×6): 10 mg via ORAL
  Filled 2022-12-21 (×8): qty 1

## 2022-12-21 MED ORDER — ASPIRIN 81 MG PO TBEC
81.0000 mg | DELAYED_RELEASE_TABLET | Freq: Every day | ORAL | Status: DC
Start: 2022-12-21 — End: 2022-12-21
  Administered 2022-12-21: 81 mg via ORAL
  Filled 2022-12-21: qty 1

## 2022-12-21 MED ORDER — DOBUTAMINE-DEXTROSE 2-5 MG/ML-% IV SOLN
2.5000 ug/kg/min | INTRAVENOUS | Status: DC
Start: 2022-12-21 — End: 2022-12-21
  Administered 2022-12-21: 2.5 ug/kg/min via INTRAVENOUS
  Filled 2022-12-21: qty 250

## 2022-12-21 MED ORDER — EMPAGLIFLOZIN 10 MG PO TABS
10.0000 mg | ORAL_TABLET | Freq: Every day | ORAL | Status: DC
Start: 2022-12-21 — End: 2022-12-21

## 2022-12-21 MED ORDER — SENNOSIDES-DOCUSATE SODIUM 8.6-50 MG PO TABS
1.0000 | ORAL_TABLET | Freq: Two times a day (BID) | ORAL | Status: DC
Start: 2022-12-22 — End: 2022-12-27
  Administered 2022-12-22 – 2022-12-27 (×6): 1 via ORAL
  Filled 2022-12-21 (×8): qty 1

## 2022-12-21 MED ORDER — DOBUTAMINE-DEXTROSE 2-5 MG/ML-% IV SOLN
2.5000 ug/kg/min | INTRAVENOUS | Status: DC
Start: 2022-12-21 — End: 2022-12-22
  Administered 2022-12-21: 2.5 ug/kg/min via INTRAVENOUS

## 2022-12-21 MED ORDER — INSULIN LISPRO 100 UNIT/ML SOLN (WRAP)
1.0000 [IU] | Freq: Every evening | Status: DC
Start: 2022-12-21 — End: 2022-12-22

## 2022-12-21 MED ORDER — METOPROLOL SUCCINATE ER 25 MG PO TB24
12.5000 mg | ORAL_TABLET | Freq: Every day | ORAL | Status: DC
Start: 2022-12-21 — End: 2022-12-21
  Administered 2022-12-21: 12.5 mg via ORAL
  Filled 2022-12-21: qty 1

## 2022-12-21 MED ORDER — INSULIN LISPRO 100 UNIT/ML SOLN (WRAP)
1.0000 [IU] | Freq: Every evening | Status: DC
Start: 2022-12-21 — End: 2022-12-21

## 2022-12-21 MED ORDER — DEXTROSE 10 % IV BOLUS
12.5000 g | INTRAVENOUS | Status: DC | PRN
Start: 2022-12-21 — End: 2022-12-22

## 2022-12-21 MED ORDER — GLUCOSE 40 % PO GEL (WRAP)
15.0000 g | ORAL | Status: DC | PRN
Start: 2022-12-21 — End: 2022-12-22

## 2022-12-21 MED ORDER — HEPARIN SODIUM (PORCINE) 5000 UNIT/ML IJ SOLN
80.0000 [IU]/kg | INTRAMUSCULAR | Status: DC | PRN
Start: 2022-12-21 — End: 2022-12-23

## 2022-12-21 MED ORDER — DEXTROSE 50 % IV SOLN
12.5000 g | INTRAVENOUS | Status: DC | PRN
Start: 2022-12-21 — End: 2022-12-22

## 2022-12-21 MED ORDER — GLUCAGON 1 MG IJ SOLR (WRAP)
1.0000 mg | INTRAMUSCULAR | Status: DC | PRN
Start: 2022-12-21 — End: 2022-12-21

## 2022-12-21 MED ORDER — HYDRALAZINE HCL 25 MG PO TABS
25.0000 mg | ORAL_TABLET | Freq: Three times a day (TID) | ORAL | Status: DC
Start: 2022-12-21 — End: 2022-12-21
  Administered 2022-12-21: 25 mg via ORAL
  Filled 2022-12-21: qty 1

## 2022-12-21 MED ORDER — NIACIN ER (ANTIHYPERLIPIDEMIC) 500 MG PO TBCR
500.0000 mg | EXTENDED_RELEASE_TABLET | Freq: Two times a day (BID) | ORAL | Status: DC
Start: 2022-12-21 — End: 2022-12-21
  Administered 2022-12-21: 500 mg via ORAL
  Filled 2022-12-21 (×4): qty 1

## 2022-12-21 MED ORDER — HEPARIN SODIUM (PORCINE) 5000 UNIT/ML IJ SOLN
40.0000 [IU]/kg | INTRAMUSCULAR | Status: DC | PRN
Start: 2022-12-21 — End: 2022-12-23

## 2022-12-21 MED ORDER — DEXTROSE 10 % IV BOLUS
12.5000 g | INTRAVENOUS | Status: DC | PRN
Start: 2022-12-21 — End: 2022-12-21

## 2022-12-21 MED ORDER — GLUCOSE 40 % PO GEL (WRAP)
15.0000 g | ORAL | Status: DC | PRN
Start: 2022-12-21 — End: 2022-12-21

## 2022-12-21 MED ORDER — GLUCAGON 1 MG IJ SOLR (WRAP)
1.0000 mg | INTRAMUSCULAR | Status: DC | PRN
Start: 2022-12-21 — End: 2022-12-22

## 2022-12-21 MED ORDER — DEXTROSE 50 % IV SOLN
12.5000 g | INTRAVENOUS | Status: DC | PRN
Start: 2022-12-21 — End: 2022-12-21

## 2022-12-21 MED ORDER — NIACIN ER (ANTIHYPERLIPIDEMIC) 500 MG PO TBCR
500.0000 mg | EXTENDED_RELEASE_TABLET | Freq: Two times a day (BID) | ORAL | Status: DC
Start: 2022-12-22 — End: 2023-01-04
  Administered 2022-12-22 – 2023-01-04 (×27): 500 mg via ORAL
  Filled 2022-12-21 (×31): qty 1

## 2022-12-21 MED ORDER — APIXABAN 5 MG PO TABS
5.0000 mg | ORAL_TABLET | Freq: Two times a day (BID) | ORAL | Status: DC
Start: 2022-12-21 — End: 2022-12-21
  Administered 2022-12-21 (×2): 5 mg via ORAL
  Filled 2022-12-21 (×2): qty 1

## 2022-12-21 MED ORDER — HEPARIN (PORCINE) IN D5W 50-5 UNIT/ML-% IV SOLN (UNITS/KG/HR ONLY)
18.0000 [IU]/kg/h | INTRAVENOUS | Status: DC
Start: 2022-12-22 — End: 2022-12-23
  Administered 2022-12-22: 18 [IU]/kg/h via INTRAVENOUS
  Administered 2022-12-23: 15 [IU]/kg/h via INTRAVENOUS
  Filled 2022-12-21 (×3): qty 500

## 2022-12-21 MED ORDER — SENNOSIDES-DOCUSATE SODIUM 8.6-50 MG PO TABS
1.0000 | ORAL_TABLET | Freq: Two times a day (BID) | ORAL | Status: DC
Start: 2022-12-21 — End: 2022-12-21
  Administered 2022-12-21 (×2): 1 via ORAL
  Filled 2022-12-21 (×2): qty 1

## 2022-12-21 MED ORDER — ATORVASTATIN CALCIUM 40 MG PO TABS
40.0000 mg | ORAL_TABLET | Freq: Every evening | ORAL | Status: DC
Start: 2022-12-21 — End: 2023-01-04
  Administered 2022-12-21 – 2023-01-03 (×14): 40 mg via ORAL
  Filled 2022-12-21 (×14): qty 1

## 2022-12-21 MED ORDER — INSULIN LISPRO 100 UNIT/ML SOLN (WRAP)
1.0000 [IU] | Freq: Three times a day (TID) | Status: DC
Start: 2022-12-22 — End: 2022-12-21

## 2022-12-21 MED ORDER — ISOSORBIDE DINITRATE 10 MG PO TABS
10.0000 mg | ORAL_TABLET | Freq: Three times a day (TID) | ORAL | Status: DC
Start: 2022-12-21 — End: 2022-12-21
  Administered 2022-12-21: 10 mg via ORAL
  Filled 2022-12-21 (×6): qty 1

## 2022-12-21 MED ORDER — INSULIN LISPRO 100 UNIT/ML SOLN (WRAP)
1.0000 [IU] | Freq: Three times a day (TID) | Status: DC
Start: 2022-12-22 — End: 2022-12-22

## 2022-12-21 MED ORDER — APIXABAN 2.5 MG PO TABS
2.5000 mg | ORAL_TABLET | Freq: Two times a day (BID) | ORAL | Status: DC
Start: 2022-12-21 — End: 2022-12-21

## 2022-12-21 MED ORDER — HYDRALAZINE HCL 25 MG PO TABS
25.0000 mg | ORAL_TABLET | Freq: Three times a day (TID) | ORAL | Status: DC
Start: 2022-12-22 — End: 2022-12-23
  Administered 2022-12-22 – 2022-12-23 (×5): 25 mg via ORAL
  Filled 2022-12-21 (×5): qty 1

## 2022-12-21 NOTE — UM Notes (Signed)
Initial IP review, 3/3:    Admit to Inpatient (Order [id number]) on 12/20/22       PATIENT NAME: [name] [name], [name] [name]   DOB: 05/09/1946     Reason for Admission   77 Year old male presents to [facility] ER on 3/3 with tachycardia and dyspnea.  He states he had Nausea and congestion.  No fever.  No sputum production.  He denies increasing weight which is under 95 pounds.  He was found to have a supraventricular tachycardia and had an attempted cardioversion 120 J with no change.  Subsequently was given 6 mg of adenosine when the rhythm slowed and subsequently is normal sinus less than 100.  The patient denies acute complaints.  He is admitted to intensive care unit for further care.  He is followed by [employer] at [place].  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  Has been treated with BTK inhibitors, venetoclax.  History of right lower extremity DVT, on Eliquis.  Previously had hyperkalemia and spironolactone was discontinued.       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    Coronary artery disease 2011    CORONARY STENT PLACEMENT FOLLOWED BY [facility]    Depression     echocardiogram 02/2016, 11/2016, 09/2017, 12/20/2017    GERD (gastroesophageal reflux disease)     Heart attack 2011    and 2014    Hyperlipemia     Hypertension     ICD (implantable 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     PLACED 2014  [facility]  LAST INTERROGATION April 01 2018 REPORT REQUESTED    Ischemic cardiomyopathy     EF 15% ON ECHO 09-20-2017 DR GARG IN EPIC    Nonischemic cardiomyopathy     NSTEMI (non-ST elevated  myocardial infarction)     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  He has been on metoprolol 25 mg daily, Isordil 10 mg 3 times daily, hydralazine 25 mg 3 times daily, and Jardiance 10 mg daily.  He is on anticoagulation with Eliquis.  Previously on Coreg and Entresto.  Coronary artery disease, MI 2011      ROS:  Respiratory ROS: Mild upper airway congestion  Cardiovascular ROS:  Palpitations.      Vital Signs:      12/21/22 0030   BP: 106/82   Pulse: 93   Resp: (!) 10   Temp:     SpO2: 96%       Labs:   Latest Reference Range & Units 12/20/22 20:34   WBC 3.10 - 9.50 x10 3/uL 12.60 (H)   Hemoglobin 12.5 - 17.1 g/dL 14.6   Hematocrit 37.6 - 49.6 % 45.2   Platelet Count 142 - 346 x10 3/uL 161   RBC 4.20 - 5.90 x10 6/uL 4.41   MCV 78.0 - 96.0 fL 102.5 (H)   MCH 25.1 - 33.5 pg 33.1   MCHC 31.5 - 35.8 g/dL 32.3   RDW 11 - 15 % 15   MPV 8.9 - 12.5 fL 12.5   Instrument Absolute Neutrophil Count 1.10 - 6.33 x10 3/uL 4.21   Neutrophils None % 33.4   Lymphocytes Automated None % 64.2   Lymphocytes Absolute Automated 0.42 - 3.22 x10 3/uL 8.09 (H)   Monocytes None % 1.4   Monocytes Absolute Automated 0.21 - 0.85 x10 3/uL 0.18 (L)   Eosinophils Automated None % 0.2   Eosinophils Absolute Automated 0.00 - 0.44 x10 3/uL 0.03   Basophils Automated None % 0.2   Basophils Absolute Automated 0.00 - 0.08 x10 3/uL 0.02   Immature Granulocytes None % 0.6   Immature Granulocytes Absolute 0.00 - 0.07 x10 3/uL 0.07   Nucleated RBC 0.0 - 0.0 /100 WBC 0.6  (H)   Neutrophils Absolute 1.10 - 6.33 x10 3/uL 4.21   Nucleated RBC Absolute 0.00 - 0.00 x10 3/uL 0.08 (H)   Cell Morphology  Abnormal !   Polychromasia  Present !   Macrocytic  2+ !   Platelet Estimate  Normal   Glucose 70 - 100 mg/dL 104 (H)   BUN 9.0 - 28.0 mg/dL 48.0 (H)   Creatinine 0.5 - 1.5 mg/dL 3.0 (H)   Sodium 135 - 145 mEq/L 142   Potassium 3.5 - 5.3 mEq/L 4.5   Chloride 99 - 111 mEq/L 106   CO2 17 - 29 mEq/L 17   Calcium 7.9 - 10.2 mg/dL 10.1   Anion Gap 5.0 - 15.0  19.0 (H)   eGFR >=60 mL/min/1.73 m2 20.9 !   Magnesium 1.6 - 2.6 mg/dL 2.9 (H)   AST 5 - 41 U/L 38   ALT 0 - 55 U/L 40   Alkaline Phosphatase 37 - 117 U/L 115   Albumin 3.5 - 5.0 g/dL 4.1   Protein Total 6.0 - 8.3 g/dL 7.4   Globulin 2.0 - 3.6 g/dL 3.3   Albumin/Globulin Ratio 0.9 - 2.2  1.2   Bilirubin Total 0.2 - 1.2 mg/dL 3.2 (H)   hs Troponin-I SEE BELOW ng/L 192.4 !!   NT-proBNP 0 - 450 pg/mL 26,524 (H)   !!: Data is critical  (H): Data is abnormally high  (L): Data is abnormally low  !: Data is abnormal      Consults:  NEPHROLOGY-    Assessment:   Oliguric AKI 2 to  cardiogenic shock   Hyperkalemia   Metabolic acidosis   Cardiogenic shock   CHF   HFrEF   Hypertension   Hyperlipidemia  Plan:   To be evaluated by Advanced hreart failure cardiology   Suggest CRRT - D/W pt and wife   May need to be transferred to [facility] for [place]:   Monitor on telemetry  to ensure no further episodes of SVT-   Would hold further IV fluids as patient has significant cardiomyopathy and may be getting volume overloaded   Continue current GDMT.  Unable to uptitrate beta-blockers given his significant cardiomyopathy   Sent message to advanced heart failure team Dr. Judeth Horn   Appreciate nephrology recs     Addendum  Labs show worsening perfusion with Cr 3.3 -> 3.9, lactate elevated to 2.9.  Patient oliguric.  Would recommend initiation of inotropes with dobutamine.  Messaged advanced heart failure, Dr. Judeth Horn,  recommends initiating transfer to [facility] for continued inpatient advanced heart failure therapies evaluation    Plan of Care  *SIGNIFICANT EVENT*  The patient has not had any further episodes of supraventricular tachycardia.  He is oliguric.  Potassium was 5.8, creatinine increased to 3.3.  No respiratory distress.  Blood pressure adequate 101/82, pulse 88     Will give bicarb infusion carefully monitor electrolytes.  Hold diuresis today.  Cardiology and nephrology consults.      Medications:  Scheduled Meds:  Current Facility-Administered Medications   Medication Dose Route Frequency    apixaban  5 mg Oral Q12H    aspirin EC  81 mg Oral Daily    atorvastatin  40 mg Oral QHS    hydrALAZINE  25 mg Oral Q8H [place]    isosorbide dinitrate  10 mg Oral TID - Nitrate Free Interval    metoprolol succinate XL  12.5 mg Oral Daily    niacin  500 mg Oral BID    senna-docusate  1 tablet Oral Q12H [place]     Continuous Infusions:   dextrose 5 % 1,000 mL with sodium bicarbonate 100 mEq infusion 60 mL/hr (12/21/22 0646)    DOBUTamine 2.5 mcg/kg/min (12/21/22 1419)     PRN Meds:.      PRN:    UTILIZATION REVIEW CONTACT: Marianna Payment RN, BSN  Utilization Review   [address]  [place], [place] [address]  Phone: [phone] (Voice Mail Only)  Email: [email or web address]   NPI:   [id number]  Tax ID:  [id number]         NOTES TO REVIEWER:    This clinical review is based on/compiled from documentation provided by the treatment team within the patient's medical record.

## 2022-12-21 NOTE — Progress Notes (Signed)
Patient pending transfer to [facility]. Okay to hold hydralazine and isordil. 1419 start dobutamine gtt. 1530 echo at bedside. Okay to d/c respiratory pathogen panel - rapid covid and flu negative.

## 2022-12-21 NOTE — Transfer Summary (Signed)
[facility]- Critical Care Note    Critical Care Transfer Note      Date Time: 12/21/22 8:11 PM  Patient Name: [name] [name]  Attending Physician: [name] Salmons, MD  Room: IC08/IC08-A   Admit Date: 12/20/2022  LOS: 1 day      CC/HPI:   77 year old male with history of hypertension, hyperlipidemia, previous NSTEMI but nonischemic cardiomyopathy EF of 21% followed by advanced heart failure being evaluated for LVAD, CKD stage III, CLL on therapy, and previous DVT on Eliquis presented with worsening shortness of breath and palpitations noted to be in SVT status post adenosine.    Subjective/ Last 24 hour Events:   Patient was seen and examined on a few occasions today.  This morning he was noted to be cool to touch and hypothermic on his temperature monitoring with blood pressures that are maintained with maps well in the 70s however he had not made much urine overnight and morning labs had demonstrated worsening renal function.    A point-of-care ultrasound was performed at bedside that demonstrated a severely reduced systolic function closer to 10% with a dilated IVC that was measuring 2.6 cm with less than 50% respiratory variation.  Given the hypothermia, worsening renal function, lack of urine output, and new findings of worsening heart failure this is consistent with heart failure/cardiogenic shock.    Cardiology was following and heart failure was contacted as a stat echo was ordered.  Given the concern for cardiogenic shock dobutamine was initiated and heart failure was made aware of the patient.  Heart failure team recommending transfer to [facility] as patient was being evaluated for an LVAD as an outpatient.  Patient's blood pressure has been consistent and reliable with maps that have been maintained in the high 60s to low 70s despite the dobutamine.  He has had some ectopy with few beats of nonsustained V. tach however has otherwise been tolerating  the dobutamine fine.    [facility] transfer center and the cardiac shock team were consulted and patient was accepted to [facility] cardiac ICU.    This evening his repeat labs demonstrate improvement in his lactate however he has made minimal urine.  A dose of diuretics was administered now that he has some inotropic support in place.  Bicarb drip stopped one for the volume purposes and two the overall bicarbonate has improved.  Patient remains alert and oriented and appears to be warmer coming off the [facility].  He is made about 100 cc of urine so far.      Assessment:     Patient Active Problem List   Diagnosis    Type 2 diabetes mellitus with complication, with long-term current use of insulin    Heart attack    CAD (coronary artery disease)    Non-ischemic cardiomyopathy    Acute exacerbation of CHF (congestive heart failure)    PUD (peptic ulcer disease)    Acute dyspnea    CHF (congestive heart failure)    Headaches due to old head injury    Essential hypertension    Syncope and collapse    AICD (automatic cardioverter/defibrillator) present    Cardiomyopathy    SOB (shortness of breath)    GERD (gastroesophageal reflux disease)    Hypertensive heart disease without heart failure    Heart failure, unspecified    Nonischemic cardiomyopathy    Pure hypercholesterolemia    Chronic systolic congestive heart failure    Encounter  for implantable defibrillator reprogramming or check    Episode of recurrent major depressive disorder, unspecified depression episode severity    Stage 3 chronic kidney disease, 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    Anxiety and depression    COPD (chronic obstructive pulmonary disease)    HLD (hyperlipidemia)    Sleep apnea, obstructive    Scrotal edema    Hydrocele, unspecified hydrocele type    Acute on chronic congestive heart failure, unspecified heart failure type    Acute on chronic right-sided heart failure    Ischemic cardiomyopathy    CLL (chronic lymphocytic leukemia)    SVT  (supraventricular tachycardia)       Plan:     #Neuro: Alert and oriented at this time  - Delirium precautions    #Cardio: Cardiogenic shock in a patient with baseline nonischemic cardiomyopathy previous EF of 21% followed by heart failure team now presenting with SVT status post conversion back to ventricular paced rhythm noted to have worsening systolic failure and cardiogenic shock  - Patient has received afterload reduction with hydralazine and Imdur earlier today.  Will hold hydralazine but she has not maps have become softer with the dobutamine and reassess Imdur given that the blood pressure at least remained stable for now.  Holding metoprolol  - Dobutamine at 2.5 mics per kilo per minute  - Patient will need advanced heart failure assessment likely right heart cath and titration of the dobutamine.  Currently he is tolerating the dobutamine well with minimal ectopy.  He is clearly volume overloaded so do not think an emergent Luiz Blare is necessary at the moment.  - Blood pressure readings have been consistent and the lactate has trended down.  1 dose of Lasix 80 mg ordered for this evening as urine output is being monitored.  - Patient will need fluid removal which may require the use of hemodialysis versus CRRT as patient has baseline renal dysfunction and now worsening cardiac function  - Continue with Eliquis and aspirin with home statin  - Holding home Jardiance    #Resp: Mild hypoxemia in the setting of fluid overload and pulmonary edema  - Oxygen for mentation to maintain saturation 90 to 94%  - Gentle diuresis     #GI:    #Infectious Disease (ID): No signs of infection at this time  - Monitor clinically  - Follow-up urinalysis    #Hem/Onc: CLL on [place]  - Hold medications for now  - Oncology follow-up    #Renal/Fluid, Electrolytes: AKI on CKD in setting of cardiogenic shock  - Bicarb drip discontinued  - Diuresis  - On direct support  - Renal following  - May need renal replacement therapy    #Endo:  History of diabetes on insulin therapy at home  - Blood sugars have been trending on the lower side so we will hold off on long-acting insulin  - On sliding scale as patient was n.p.o. for majority of the day    #Nutrition: Clear liquid diet anticipating any procedure; advance as tolerated    GENERAL CRITICAL CARE ASSESSMENT/ PLANS- IF APPLICABLE    Vascular Access: PIV  GI Prophylaxis: Not indicated  VTE Prophylaxis: apixaban (ELIQUIS) tablet 5 mg   Nutrition: Clear liquid  Sedation: None  Foley Catheter: No  PT/OT: Current PT Order: No  Current OT Order: No  Code Status:   Full Code    Plan of care was discussed with the patient and wife at bedside in extensive detail including findings  on point-of-care ultrasound and echocardiogram with acute cardiogenic shock as a transfer to [facility] for advanced cardiac intervention.  Patient and wife are in agreement.  Their questions answered to their satisfaction.      Medications:   Scheduled Meds:  Current Facility-Administered Medications   Medication Dose Route Frequency    apixaban  5 mg Oral Q12H    aspirin EC  81 mg Oral Daily    atorvastatin  40 mg Oral QHS    furosemide  80 mg Intravenous Once    isosorbide dinitrate  10 mg Oral TID - Nitrate Free Interval    niacin  500 mg Oral BID    senna-docusate  1 tablet Oral Q12H [place]       Continuous Infusions:   DOBUTamine 2.5 mcg/kg/min (12/21/22 1419)        Physical Exam:     Vitals:    12/21/22 1900   BP: 113/82   Pulse: 88   Resp: 22   Temp:    SpO2: 94%         Intake/Output Summary (Last 24 hours) at 12/21/2022 2011  Last data filed at 12/21/2022 1834  Gross per 24 hour   Intake 1720.1 ml   Output 150 ml   Net 1570.1 ml       Physical Exam  General Appearance: Alert and interactive, falls asleep easily  Neuro: Moving all extremities without any focal deficits  Neck:supple  Lungs: Mild bibasilar crackles but otherwise clear to auscultation, no wheezes or tachypnea  Cardiac: normal rate, regular rhythm, left chest pacemaker  in place  Abdomen:  soft, nontender, nondistended, BS+  Extremities: Mild lower extremity edema, extremities cool to touch  Skin:  no jaundice    Labs:     Labs (last 72 hours):  Recent Labs     12/21/22  0416 12/20/22  2034   WBC 13.49* 12.60*   Hgb 13.9 14.6   Hematocrit 44.2 45.2     No results for input(s): "PT", "INR", "PTT" in the last 72 hours. Recent Labs     12/21/22  1837 12/21/22  1050 12/21/22  0416 12/20/22  2038 12/20/22  2034   Sodium 135 137 141  --  142   Potassium 4.8 5.6* 5.8*  --  4.5   Chloride 103 105 108  --  106   CO2 '19 18 17  '$ --  17   BUN 60.0* 53.0* 51.0*  --  48.0*   Creatinine 4.1* 3.9* 3.3*  --  3.0*   Creatinine I-Stat  --   --   --    < >  --    Glucose 167* 97 75  --  104*   Calcium 8.9 9.4 9.6  --  10.1   Magnesium 2.8*  --  3.2*  --  2.9*   Phosphorus 7.4*  --   --   --   --     < > = values in this interval not displayed.               Rads:   Radiological Imaging personally reviewed, including:   Chest AP Portable    Result Date: 12/20/2022  No acute intrathoracic process. Montine Circle, MD 12/20/2022 9:12 PM       Critical care provider statement:     Critical care time (minutes):  87    I have personally reviewed the patient's history and 24 hour interval events, along with vitals, labs, radiology images and nursing.  Critical care time was exclusive of separately billable procedures and treating other patients and teaching time.   Critical care was time spent personally by me on one or more of the following activities: discussions with consultants, development of treatment plan with patient or surrogate, evaluation of patient's response to treatment, examination of patient, obtaining history from patient or surrogate, ordering and performing treatments and interventions, ordering and review of laboratory studies, ordering and review of radiographic studies, pulse oximetry, re-evaluation of patient's condition and review of old charts.   At least one organ system is acutely  impaired.   There is a high probability of imminent, life-threatening deterioration.   I intervened to try to prevent further deterioration of the patient's condition.        Signed by:  [name] Salmons, MD                       [place]              12/21/22 8:11 PM             Medical Intensive Care Unit           Ext. [phone]          Intermediate Care Unit                       MD Ext: [id number]          NP Ext. [id number]           8p-8a provider Coverage Ex [phone]

## 2022-12-21 NOTE — Progress Notes (Signed)
Initial Case Management Assessment and Discharge Triumph   Patient Name: [name] [name], [name] [name]   Date of Birth 04/28/1946   Attending Physician: Linus Salmons, MD   Primary Care Physician: Clearance Coots, MD   Length of Stay 1   Reason for Consult / Chief Complaint 77 yo M admitted w/ SVT, CHF.        Situation   Admission DX:   1. SVT (supraventricular tachycardia)    2. Acute renal failure superimposed on chronic kidney disease, unspecified acute renal failure type, unspecified CKD stage        A/O Status: X 3    Patient admitted from: ER  Admission Status: inpatient    Health Care Agent: Self     Background     Advanced directive:   Not Received    Code Status:   Full Code     Residence: One story home/apartment    Patient Contact:   [phone] (home)     [phone] (mobile)     Emergency contact: Extended Emergency Contact Information  Primary Emergency Contact: [facility]  Address: [address] [place], [place] [address] Johnnette Litter of [employer] Phone: [phone]  Relation: Spouse     PCP: Clearance Coots, MD  Pharmacy:   [facility]  [place] [address]  Phone: [phone] Fax: [facility] [address] [facility]  [place] [address]  Phone: [phone] Fax: [phone]    Prescription Coverage: Yes    ADL/IADL's: Independent  Previous Level of function: 7 Independent     DME: None    Home Health: The patient is not currently receiving home health services.    Previous SNF/AR: [facility]      Transport for discharge? Mode of transportation: Sports coach - Family/Friend to drive patient   Assessment   Spoke with pts wife, [name] [name], via telephone for CM routine screening, assessment and care coordination for discharge planning, demographics & pharmacy verified. Pt lives with with his wife in a one  level home. PTA pt was independent with ADLs and IADLs. Pts PCP is Clearance Coots, MD, they were last seen by this office January 2023. Pts wife will provide transportation at time of [place]. All questions answered prior to ending call w/ Octavia. The above information has been passed onto pts on-site CM.   BARRIERS TO DISCHARGE: none at this time   Recommendation   [place] home vs rehab.. CM will continue to follow for hospital discharge needs.      Amedeo Kinsman, BSN RN  Case Management  [facility]  [address], [place]  [place], [place] [address]  [place]

## 2022-12-21 NOTE — Consults (Cosign Needed)
Patient's Name: [name] [name]   Admit Date: 12/21/2022  Medical Record Number: [id number]   Code Status: Full code  Room: FI102/FI102-01  Date/Time: 12/21/22 11:32 PM  Cardiologist of record: [facility]/Dr. Welch's patient    IMPRESSIONS:   Heart failure-cardiogenic shock, SCAI stage C  Acute on chronic decompensated heart failure, LVEF less than 15%  TTE 3/4: Severely reduced LV function with EF 11%, LVIDD 6.8 cm, global hypokinesis, mildly dilated RV with moderately decreased RV systolic function, severe functional MR, severe tricuspid regurgitation with RVSP 51 mmHg  Ischemic cardiomyopathy, NYHA class IV, ACC/AHA stage D, currently undergoing DT LVAD evaluation  RHC 09/09/22: RA 7, RV 39/4, PA 41/21/29, PCWP 14, PA sat 62%, RA sat 59%, AO sat 95%, Fick CO/CI 4.0/2.0, TD CO/CI 3.5/1.7, MAP 89, PAPi 3, PVR 3.75-4.3 WU  Narrow complex supraventricular tachycardia, s/p unsuccessful cardioversion 3/4  Coronary artery disease s/p PCI/DES in 2011 with 3 stents and then 2 stents when he was in [place]  S/p Med[employer] BiV ICD  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  If he decompensates, would consider IABP as a bridge to DT LVAD  Inotropy:  Continue dobutamine at 2.5 mg/kg/min  N.p.o. at midnight for right heart cath with leave in [place] tomorrow  Preload:  Takes torsemide 40 mg twice daily at home, will give him Bumex 2 mg now and repeat dosing based on right heart cath numbers  Goal CVP less than 10  Afterload:  Continue home  hydralazine 25 mg 3 times daily and Isordil 10 mg 3 times daily for afterload reduction  Advance therapies evaluation:  Currently being evaluated for DT LVAD    GDMT for Heart failure  Beta blocker: Hold in setting of cardiogenic shock  ACE/ARB/ARNI: Hold in setting of worsening renal function  MRA: Hold in setting of worsening renal function.  Previously has had hyperkalemia issues  SGLT2i: Hold for n.p.o., procedures, and reduced renal function  Switch Eliquis to heparin drip  Rest of care per [facility] discussed with team, nursing, and patient/family    Signed: Bascom Levels, MD, Cardiology Fellow    -------------------------------------------------------------------------------------------------------    Reason for Consult: Heart failure-cardiogenic shock    Reason for Admission to CICU (select one)  '[x]'$  Primary Cardiac Problem warranting ICU care  '[]'$  Post-procedural observation (non-surgical cardiac procedure)  '[]'$  General medical problem in patient with cardiac disease     '[]'$  Acute cardiovascular complication of a non-CV disease  '[]'$  Post-cardiac surgical   '[]'$  No cardiac issues ("MSICU overflow")    Presenting Primary Cardiac Problem (select one)  '[]'$  No acute cardiac problem - if selected, why was patient triaged to the CICU?    '[]'$  Primary non-cardiac problem, SPECIFY:   '[]'$  Chronic (non-acute) cardiac problem, SPECIFY:   '[]'$  Suspected cardiac problem (e.g. ACS) that was later excluded   '[]'$  Other, clarify:   '[]'$  Acute aortic syndrome  '[]'$  Acute coronary syndrome  '[]'$  Type 2 (demand-related) MI  '[]'$  Arrhythmia: Atrial tachyarrhythmia  '[]'$  Arrhythmia: Ventricular tachycardia/VF  '[]'$   Arrhythmia: Unstable conduction disorder  '[]'$  Cardiac arrest (unknown etiology)  '[x]'$  Cardiogenic shock (etiology not otherwise listed)  '[]'$  Complex congenital  '[]'$  Heart failure  '[]'$  Hypertensive emergency/urgency  '[]'$  Pericardial effusion (tamponade or pre-tamponade)  '[]'$  Pulmonary embolism  '[]'$  Pulmonary hypertension (severe)  '[]'$  Valvular heart  disease (including endocarditis)  '[]'$  Procedural complication  '[]'$  Other - please specify in free text    Presenting Secondary Cardiac Problem (select one)  '[]'$  No acute cardiac problem - if selected, why was patient triaged to the CICU?    '[]'$  Primary non-cardiac problem, SPECIFY:   '[]'$  Chronic (non-acute) cardiac problem, SPECIFY:   '[]'$  Suspected cardiac problem (e.g. ACS) that was later excluded   '[]'$  Other, clarify:   '[]'$  Acute aortic syndrome  '[]'$  Acute coronary syndrome  '[]'$  Type 2 (demand-related) MI  '[]'$  Arrhythmia: Atrial tachyarrhythmia  '[]'$  Arrhythmia: Ventricular tachycardia/VF  '[]'$  Arrhythmia: Unstable conduction disorder  '[]'$  Cardiac arrest (unknown etiology)  '[]'$  Cardiogenic shock (etiology not otherwise listed)  '[]'$  Complex congenital  '[x]'$  Heart failure  '[]'$  Hypertensive emergency/urgency  '[]'$  Pericardial effusion (tamponade or pre-tamponade)  '[]'$  Pulmonary embolism  '[]'$  Pulmonary hypertension (severe)  '[]'$  Valvular heart disease (including endocarditis)  '[]'$  Procedural complication  '[]'$  No secondary cardiac problem  '[]'$  Other - please specify in free text     Indications (select one)  '[]'$  Cardiac arrest  '[]'$  Respiratory insufficiency  '[]'$  Hypotension without shock  '[x]'$  Cardiogenic shock  '[]'$  Other shock (includes distributive and mixed etiology)  '[]'$  Unstable arrhythmia  '[]'$  Neurological emergency  '[]'$  Need for acute renal replacement therapy [omit for chronic routine dialysis]   '[]'$  Need for IV vasoactive therapy in absence of shock or hypotension  '[]'$  Need for ICU protocol, medication or device (e.g. EKOS, inhaled NO, or allergy  desensitization), SPECIFY:  '[]'$  Post-procedural monitoring (e.g. TAVR, absent a primary reason otherwise specified  above)  '[]'$  Need for frequent labs or monitoring (not specified above)  '[]'$  Other, SPECIFY:     Location of first CARDIAC ARREST (select one) [if applicable]   '[]'$  In the field/at home   '[]'$  In-hospital  '[]'$  At OSH    Initial Pulseless Rhythm (select one)  '[]'$  VF or AED-Shockable  '[]'$  Ventricular  tachycardia  '[]'$  Asystole  '[]'$  PEA  '[]'$  Unknown    History:  [name] [name] is a 77 y.o. 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  He was given a dose of adenosine and had an unsuccessful cardioversion with 120 J.  Adenosine eventually converted to sinus rhythm and telemetry showed a sensed V paced rhythms.  Admitted to ICU at [facility] for further management. However, then transferred to [facility] on new Dobutamine 2.5 mcg/kg/min for LVAD consideration.      Of note follows with [facility]. Has been followed by [facility] for advanced therapies, including LVAD where he has been seen by Dr. Alain Marion. Home medications include Eliquis 2.5 mg BID, ASA 81 mg QD, Lipitor 40 mg QHS, Jardiance 10 mg QD, Hydralazine 25 mg Q8H, Isosorbide Dinitrate 10 mg TID, metoprolol succinate XL 12. 5 mg QD, torsemide 40 mg BID, Lantus 50U QD. Was previously on Aldactone but discontinued given renal function. Dry weight is around 185  lb per 10/2022, weight today around 195-199 lb.     Follows with Dr. Mervin Kung of [facility] for CLL. Dx'd in 06/2020. Thought to be well controlled on acalabrutinib.     Follows with [facility] for CKD. Cr increased from 1.5 in 03/2022 to 2.3 in 09/2022. Previously on HD/UF in 2018.     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  He lives with his wife in [place], previously was living in [place] but due to high cost of living and exorbitant housing rates, he moved to [place].  He has 3 sons and 1 daughter, and 1 grandchild.  They all live in different parts of the [place] and no one is close by.  His primary  caregiver is his wife    PMH:    Past Medical History:   Diagnosis Date    Acute CHF     NOV  & DEC 2018, - DIALYSIS CATH PLACED Aug 24 2017 TO REMOVE FLUID     Acute systolic (congestive) heart failure 08/2017    Anxiety     CAD (coronary artery disease)     Cardiomyopathy     nonischemic    CHF (congestive heart failure) 08/12/2014, 2013    Chronic obstructive pulmonary disease     POSSIBLE PER PT HE USES  SYNBICORT BID ABD  BREO INHALER PRN    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    Coronary artery disease 2011    CORONARY STENT PLACEMENT FOLLOWED BY [facility]    Depression     echocardiogram 02/2016, 11/2016, 09/2017, 12/20/2017    GERD (gastroesophageal reflux disease)     Heart attack 2011    and 2014    Hyperlipemia     Hypertension     ICD (implantable 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     PLACED 2014  [facility]  LAST INTERROGATION April 01 2018 REPORT REQUESTED    Ischemic cardiomyopathy     EF 15% ON ECHO 09-20-2017 DR GARG IN EPIC    Nonischemic cardiomyopathy     NSTEMI (non-ST elevated myocardial infarction)     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    Pacemaker 2014    MEDTRONIC ICD/PACEMAKER COMBO LAST INTERROGATION  12-12 2018    Pneumonia 09/2016    Primary cardiomyopathy     Ischemic cardiomyopathy EF 15% ON ECHO 09-20-2017 DR GARG IN EPIC    Sepsis 08/2017    Syncope and collapse     PRIOR TO ICD/PACEMAKER PLACED 2014    Type 2 diabetes mellitus, controlled DX 1998    BS  AVG 100  A1C 7.4 Dec 27 2017    [place]     PT SEE HIS PMD April 01 2018 RE THIS-TOLD TO SEE PMD BY [facility] JUNE 12 WHEN THEY SAW HIM FOR A CL       PSH:    Past Surgical History:   Procedure Laterality Date    BIV  11/10/2016    ICD METRONIC  UPGRADE     CARDIAC CATHETERIZATION  03/2010    CORONARY STENT PLACEMENT X 2 PER PT; LM normal, LAD 70-80% distal lesion at apex, 20% lesion mid CFX    CARDIAC CATHETERIZATION  12/2012    CARDIAC DEFIBRILLATOR PLACEMENT  12/2012    [facility]    CARDIAC PACEMAKER PLACEMENT  2014    MEDTRONIC ICD/PACEMAKER  PLACED [facility]  CIRCUMCISION  AGE 79    COLONOSCOPY  2009    CORRECTION HAMMER TOE      duodenal ulcer  1973    STRESS RELATED IN SCHOOL    ECHOCARDIOGRAM, TRANSTHORACIC  02/2016 11/2016 09/2017 12/2017    ECHOCARDIOGRAM, TRANSTHORACIC  02/2016,11/2016,12/20/2017    EGD N/A 08/15/2014    Procedure: EGD;  Surgeon: Loraine Maple, MD;  Location: [facility];  Service: Gastroenterology;  Laterality: N/A;  egd w/ bx    EGD  1975    EGD, COLONOSCOPY N/A 04/04/2018    Procedure: EGD with bxs, COLONOSCOPY with polypectomy and clipping;  Surgeon: Omer Jack, MD;  Location: [facility];  Service: Gastroenterology;  Laterality: N/A;    FRACTURE SURGERY  AGE 16     RT  ANKLE- CAST APPLIED    HERNIA REPAIR  AGE 79    LEFT INGUINAL HERNIA    MPI nuclear study  06/2016    orthopedic surgery  AGE 52    RT foot corrective - HAMMERTOE    RIGHT HEART CATH Right 09/09/2022    Procedure: Right Heart Cath;  Surgeon: Wandra Arthurs, MD;  Location: [facility];  Service: Cardiovascular;  Laterality: Right;  tx from [place]       FH:  Family History   Problem Relation Age of Onset    Breast cancer Mother     Heart attack Mother 81    Hypertension Mother     Diabetes Mother     Coronary artery disease Mother     Diabetes Brother     Heart attack Father     Hypertension Father        Social History:  Social History     Socioeconomic History    Marital status: Married     Spouse name: Architect    Number of children: 0    Years of education: Not on file    Highest education level: Not on file   Occupational History    Occupation: Pharmacist, 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[employer], history    Tobacco Use    Smoking status: Never    Smokeless tobacco: Never   Vaping Use    Vaping Use: Never used   Substance and Sexual Activity    Alcohol use: Not Currently    Drug use: No    Sexual activity: Yes     Partners: Female   Other Topics Concern    Not on file   Social History Narrative    Not on file     Social  Determinants of Health     Financial Resource Strain: Not on file   Food Insecurity: No Food Insecurity (09/12/2022)    Hunger Vital Sign     Worried About Running Out of Food in the Last Year: Never true     Ran Out of Food in the Last Year: Never true   Transportation Needs: No Transportation Needs (09/12/2022)    PRAPARE - Armed forces logistics/support/administrative officer (Medical): No     Lack of Transportation (Non-Medical): No   Physical Activity: Not on file   Stress: Not on file   Social Connections: Not on file   Intimate Partner Violence: Not At Risk (09/12/2022)    Humiliation, Afraid, Rape, and Kick questionnaire     Fear of Current or Ex-Partner: No     Emotionally Abused: No     Physically Abused: No  Sexually Abused: No   Housing Stability: Low Risk  (09/12/2022)    Housing Stability Vital Sign     Unable to Pay for Housing in the Last Year: No     Number of Places Lived in the Last Year: 1     Unstable Housing in the Last Year: No       Home medications:   Prior to Admission medications    Medication Sig Start Date End Date Taking? Authorizing Provider   apixaban (ELIQUIS) 5 MG Take 1 tablet (5 mg) by mouth every 12 (twelve) hours For 1 week followed by 1 tablet twice daily 09/14/22   Irma Newness, MD   aspirin EC 81 MG EC tablet Take 1 tablet (81 mg) by mouth daily    [provider]   atorvastatin (LIPITOR) 40 MG tablet Take 1 tablet (40 mg total) by mouth nightly 07/16/21   Carlis Abbott, MD   Calquence 100 MG Tab  04/07/22   [provider]   hydrALAZINE (APRESOLINE) 25 MG tablet Take 1 tablet (25 mg) by mouth every 8 (eight) hours 09/14/22 12/13/22  Irma Newness, MD   Insulin Glargine-yfgn (Semglee, yfgn,) 100 UNIT/ML Solution Pen-injector 50 Units by Subdermal route daily 10/26/22   Clearance Coots, MD   isosorbide dinitrate (ISORDIL) 10 MG tablet Take 1 tablet (10 mg) by mouth 3 (three) times daily 09/14/22 12/13/22  Irma Newness, MD   Jardiance 10 MG tablet TAKE 1 TABLET(10 MG)  BY MOUTH DAILY 06/05/22   Clotilde Dieter, NP   loratadine (CLARITIN) 10 MG tablet Take 1 tablet (10 mg) by mouth daily 04/18/22   Regis Bill, MD   metoprolol succinate XL (TOPROL-XL) 25 MG 24 hr tablet Take 0.5 tablets (12.5 mg) by mouth daily 05/22/22   Clotilde Dieter, NP   niacin (NIASPAN) 500 MG CR tablet TAKE 1 TABLET BY MOUTH TWICE DAILY  Patient taking differently: 250 mg Taking half a tablet 02/23/22   Clearance Coots, MD   torsemide (DEMADEX) 20 MG tablet Take 2 tablets (40 mg) by mouth 2 (two) times daily 09/14/22   Irma Newness, MD        Inpatient/ER medications:  Medications Prior to Admission   Medication Sig    apixaban (ELIQUIS) 5 MG Take 1 tablet (5 mg) by mouth every 12 (twelve) hours For 1 week followed by 1 tablet twice daily    aspirin EC 81 MG EC tablet Take 1 tablet (81 mg) by mouth daily    atorvastatin (LIPITOR) 40 MG tablet Take 1 tablet (40 mg total) by mouth nightly    Calquence 100 MG Tab     hydrALAZINE (APRESOLINE) 25 MG tablet Take 1 tablet (25 mg) by mouth every 8 (eight) hours    Insulin Glargine-yfgn (Semglee, yfgn,) 100 UNIT/ML Solution Pen-injector 50 Units by Subdermal route daily    isosorbide dinitrate (ISORDIL) 10 MG tablet Take 1 tablet (10 mg) by mouth 3 (three) times daily    Jardiance 10 MG tablet TAKE 1 TABLET(10 MG) BY MOUTH DAILY    loratadine (CLARITIN) 10 MG tablet Take 1 tablet (10 mg) by mouth daily    metoprolol succinate XL (TOPROL-XL) 25 MG 24 hr tablet Take 0.5 tablets (12.5 mg) by mouth daily    niacin (NIASPAN) 500 MG CR tablet TAKE 1 TABLET BY MOUTH TWICE DAILY (Patient taking differently: 250 mg Taking half a tablet)    torsemide (DEMADEX) 20 MG tablet Take 2 tablets (40 mg) by mouth 2 (two) times  daily       Current Inpatient :  Current Facility-Administered Medications   Medication Dose Route Frequency    [START ON 12/22/2022] aspirin EC  81 mg Oral Daily    atorvastatin  40 mg Oral QHS    insulin lispro  1-3 Units Subcutaneous QHS    [START ON 12/22/2022] insulin  lispro  1-5 Units Subcutaneous TID AC    [START ON 12/22/2022] isosorbide dinitrate  10 mg Oral TID - Nitrate Free Interval    [START ON 12/22/2022] niacin  500 mg Oral BID    [START ON 12/22/2022] senna-docusate  1 tablet Oral Q12H [place]      DOBUTamine         ROS:  No constitutional complaints, no respiratory complaints, no musculoskeletal complaints, or other pertinent review of symptoms except as noted in the history of present illness.    Physical Exam:  No intake/output data recorded.  Weight:   Wt Readings from Last 4 Encounters:   12/21/22 93.1 kg (205 lb 4 oz)   12/21/22 90.4 kg (199 lb 4.7 oz)   11/23/22 91.4 kg (201 lb 6.4 oz)   10/26/22 92.4 kg (203 lb 12.8 oz)     Vitals:    12/21/22 2159   BP: 97/67   Pulse: 87   Resp: 20   Temp: 97.5 F (36.4 C)   SpO2: 98%      General: in no distress.  Alert and oriented x 3  Eyes: No xanthelasma.  Oropharynx: No mucosal pallor.  Neck: Trachea midline, JVD not appreciated  Pulmonary: Normal respiratory effort, clear lung sounds bilaterally.  Cardiovascular: PMI nondisplaced with normal rate and rhythm, normal S1, normal S2, no murmur, rub, or gallop appreciated.  Abdomen: Soft, nontender, mildly distended  Extremities: trace edema, no clubbing, no cyanosis.  Skin: No tendinous xanthomas, warm and well perfused  Psych:  Mood was normal    Laboratory/Imaging/Procedures:    Recent Labs     12/21/22  1837 12/21/22  1050 12/21/22  0416 12/20/22  2034   Sodium 135 137 141 142   Potassium 4.8 5.6* 5.8* 4.5   Chloride 103 105 108 106   CO2 '19 18 17 17   '$ BUN 60.0* 53.0* 51.0* 48.0*   Calcium 8.9 9.4 9.6 10.1   Magnesium 2.8*  --  3.2* 2.9*   Phosphorus 7.4*  --   --   --      '@LABCNT'$ (LLBILI3)@  Recent Labs     12/21/22  0416 12/20/22  2034   WBC 13.49* 12.60*   Hgb 13.9 14.6   Hematocrit 44.2 45.2   Platelets 159 161     No results for input(s): "PT", "INR", "PTT" in the last 72 hours.  Invalid input(s): "LAC"  Invalid input(s): "LLCARDENZ3"

## 2022-12-21 NOTE — ED Provider Notes (Signed)
EMERGENCY DEPARTMENT NOTE    None        HISTORY OF PRESENT ILLNESS   Translator Used: No    Chief Complaint: Shortness of Breath and Chest Pain       77 y.o. male with history of CAD, cardiomyopathy, CHF, COPD, CLL, status post pacemaker/defibrillator, presents with complaints of shortness of breath, heart pounding, associated with nausea.  Report has been feeling bad for the past 4 days, feeling congested in his chest, today felt the worse.  Denies any fever.  Denies leg swelling.  Reports his weight was 195 pounds which is within the range that his cardiologist wants him to be in.  Reports he was scheduled to have an LVAD but it was pushed back due to his renal function.        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    Coronary artery disease 2011    CORONARY STENT PLACEMENT FOLLOWED BY [facility]    Depression     echocardiogram 02/2016, 11/2016, 09/2017, 12/20/2017    GERD (gastroesophageal reflux disease)     Heart attack 2011    and 2014    Hyperlipemia     Hypertension     ICD (implantable 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     PLACED 2014  [place]  LAST INTERROGATION April 01 2018 REPORT REQUESTED    Ischemic cardiomyopathy     EF 15% ON ECHO 09-20-2017 DR GARG IN EPIC    Nonischemic cardiomyopathy     NSTEMI (non-ST elevated myocardial infarction)     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    Pacemaker 2014    MED[employer] ICD/PACEMAKER COMBO LAST INTERROGATION  12-12 2018    Pneumonia 09/2016    Primary cardiomyopathy     Ischemic cardiomyopathy EF 15% ON ECHO 09-20-2017  DR GARG IN EPIC    Sepsis 08/2017    Syncope and collapse     PRIOR TO ICD/PACEMAKER PLACED 2014    Type 2 diabetes mellitus, controlled DX 1998    BS  AVG 100  A1C 7.4 Dec 27 2017    [place]     PT SEE HIS PMD April 01 2018 RE THIS-TOLD TO SEE PMD BY [facility] JUNE 12 WHEN THEY SAW HIM FOR A CL       Past Surgical History:  Past Surgical History:   Procedure Laterality Date    BIV  11/10/2016    ICD METRONIC  UPGRADE     CARDIAC CATHETERIZATION  03/2010    CORONARY STENT PLACEMENT X 2 PER PT; LM normal, LAD 70-80% distal lesion at apex, 20% lesion mid CFX    CARDIAC CATHETERIZATION  12/2012    CARDIAC DEFIBRILLATOR PLACEMENT  12/2012    [facility]    CARDIAC PACEMAKER PLACEMENT  2014    MEDTR[employer]IC ICD/PACEMAKER PLACED  HEART    CIRCUMCISION  AGE 30  COLONOSCOPY  2009    CORRECTION HAMMER TOE      duodenal ulcer  1973    STRESS RELATED IN SCHOOL    ECHOCARDIOGRAM, TRANSTHORACIC  02/2016 11/2016 09/2017 12/2017    ECHOCARDIOGRAM, TRANSTHORACIC  02/2016,11/2016,12/20/2017    EGD N/A 08/15/2014    Procedure: EGD;  Surgeon: Loraine Maple, MD;  Location: [facility];  Service: Gastroenterology;  Laterality: N/A;  egd w/ bx    EGD  1975    EGD, COLONOSCOPY N/A 04/04/2018    Procedure: EGD with bxs, COLONOSCOPY with polypectomy and clipping;  Surgeon: Omer Jack, MD;  Location: [facility];  Service: Gastroenterology;  Laterality: N/A;    FRACTURE SURGERY  AGE 13     RT  ANKLE- CAST APPLIED    HERNIA REPAIR  AGE 78    LEFT INGUINAL HERNIA    MPI nuclear study  06/2016    orthopedic surgery  AGE 44    RT foot corrective - [facility]OE    RIGHT HEART CATH Right 09/09/2022    Procedure: Right Heart Cath;  Surgeon: Wandra Arthurs, MD;  Location: [facility];  Service: Cardiovascular;  Laterality: Right;  tx from [place]       Social History:  Social History     Socioeconomic History    Marital status: Married     Spouse name: Architect    Number of children: 0    Occupational History    Occupation: Therapist, [employer] [place], [place]    Tobacco Use    Smoking status: Never    Smokeless tobacco: Never   Vaping Use    Vaping Use: Never used   Substance and Sexual Activity    Alcohol use: Not Currently    Drug use: No    Sexual activity: Yes     Partners: Female     Social Determinants of Health     Food Insecurity: No Food Insecurity (09/12/2022)    Hunger Vital Sign     Worried About Running Out of Food in the Last Year: Never true     Ran Out of Food in the Last Year: Never true   Transportation Needs: No Transportation Needs (09/12/2022)    PRAPARE - Armed forces logistics/support/administrative officer (Medical): No     Lack of Transportation (Non-Medical): No   Intimate Partner Violence: Not At Risk (09/12/2022)    Humiliation, Afraid, Rape, and Kick questionnaire     Fear of Current or Ex-Partner: No     Emotionally Abused: No     Physically Abused: No     Sexually Abused: No   Housing Stability: Low Risk  (09/12/2022)    Housing Stability Vital Sign     Unable to Pay for Housing in the Last Year: No     Number of Places Lived in the Last Year: 1     Unstable Housing in the Last Year: No       Family History:  Family History   Problem Relation Age of Onset    Breast cancer Mother     Heart attack Mother 80    Hypertension Mother     Diabetes Mother     Coronary artery disease Mother     Diabetes Brother     Heart attack Father     Hypertension Father          PHYSICAL EXAM     ED Triage Vitals  Enc Vitals Group      BP 12/20/22 2030 103/73      Heart Rate 12/20/22 2012 (!) 160      Resp Rate 12/20/22 2030 15      Temp 12/20/22 2222 98.3 F (36.8 C)      Temp Source 12/20/22 2222 Temporal      SpO2 12/20/22 2012 98 %      Weight 12/20/22 2032 93.5 kg      Height 12/21/22 0030 1.905 m      Head Circumference --       Peak Flow --       Pain Score 12/20/22 2059 0      Pain Loc --       Pain Edu? --       Excl. in [place]? --          Nursing note and vitals reviewed.  Constitutional: Ill  appearing.  Head:  Atraumatic. Normocephalic.    Eyes:  Normal Sclera. EOMI.   ENT:  Patent airway.  Mucous membrane dry.  Neck:  Supple. Normal ROM.   Cardiovascular: Tachycardic.  Regular rhythm. No murmurs, rubs, or gallops.  Pulmonary/Chest:  No evidence of respiratory distress. Clear to auscultation bilaterally.  No wheezing, rales or rhonchi.   Abdominal:  Soft and non-distended. There is no tenderness. No rebound, guarding, or rigidity.   Back:   No CVAT. Nontender.   Extremities:  No calf tenderness. No edema. Normal range of motion.   Skin:  Skin is warm and dry.  No diaphoresis.    Neurological:  Alert, awake, and appropriate. Normal speech. Motor normal.  Psychiatric:  Good eye contact. Normal interaction, affect, and behavior.      [place]     EMERGENCY DEPT. MEDICATIONS      ED Medication Orders (From admission, onward)      Start Ordered     Status Ordering Provider    12/20/22 2229 12/20/22 2228  morphine injection 4 mg  Once        Route: Intravenous  Ordered Dose: 4 mg       Last MAR action: Given Alexyss Balzarini MEI    12/20/22 2229 12/20/22 2228  ondansetron (ZOFRAN) injection 4 mg  Once        Route: Intravenous  Ordered Dose: 4 mg       Last MAR action: Given Toshiko Kemler MEI    12/20/22 2051 12/20/22 2050  magnesium sulfate 1g in dextrose 5% 176m IVPB (premix)  Once        Route: Intravenous  Ordered Dose: 1 g       Last MAR action: Stopped Kamran Coker MEI    12/20/22 2042 12/20/22 2041  adenosine (ADENOCARD) injection 12 mg  Once        Route: Intravenous  Ordered Dose: 12 mg       Last MAR action: Not Given MENDIGUREN, IGNACIO I    12/20/22 2040 12/20/22 2042  adenosine (ADENOCARD) injection 6 mg  Once        Route: Intravenous  Ordered Dose: 6 mg       Last MAR action: Given Xaviera Flaten MEI    12/20/22 2030 12/20/22 2041  midazolam (VERSED) injection 2 mg  Once        Route: Intravenous  Ordered Dose: 2 mg       Last MAR action: Given Delainie Chavana MEI            LABORATORY RESULTS  Ordered and independently interpreted AVAILABLE laboratory tests.   Results       Procedure Component Value Units Date/Time    Respiratory Pathogen Panel w/COVID-19, PCR [id number] Collected: 12/21/22 0106    Specimen: Nasopharyngeal Updated: 12/21/22 0113    Narrative:      For NP, Call laboratory for VTM NP Swab collection device.  Bronchoalveolar Lavage is submitted in a sterile container.  Indication:->Current outbreak of severe viral illness due to  pathogen only identifiable by RVP (not COVID, Influenza or  RSV)  Diagnostic -PUI  Viral Transport Media    Glucose Whole Blood - POCT [id number]  (Abnormal) Collected: 12/21/22 0039     Updated: 12/21/22 0048     Whole Blood Glucose POCT 101 mg/dL     Culture Blood Aerobic and Anaerobic [id number] Collected: 12/21/22 0021    Specimen: Blood, Venipuncture Updated: 12/21/22 0021    Narrative:      Indications:->Other  Other->infection  1 BLUE+1 PURPLE    Culture Blood Aerobic and Anaerobic [id number] Collected: 12/21/22 0021    Specimen: Blood, Venipuncture Updated: 12/21/22 0021    Narrative:      Indications:->Other  Other->infection  1 BLUE+1 PURPLE    High Sensitivity Troponin-I at 2 hrs with calculated Delta [id number]  (Abnormal) Collected: 12/20/22 2222    Specimen: Blood Updated: 12/20/22 2257     hs Troponin-I 183.0 ng/L      hs Troponin-I Delta -9.4 ng/L     Comprehensive metabolic panel [id number]  (Abnormal) Collected: 12/20/22 2034    Specimen: Blood Updated: 12/20/22 2152     Glucose 104 mg/dL      BUN 48.0 mg/dL      Creatinine 3.0 mg/dL      Sodium 142 mEq/L      Potassium 4.5 mEq/L      Chloride 106 mEq/L      CO2 17 mEq/L      Calcium 10.1 mg/dL      Protein, Total 7.4 g/dL      Albumin 4.1 g/dL      AST (SGOT) 38 U/L      ALT 40 U/L      Alkaline Phosphatase 115 U/L      Bilirubin, Total 3.2 mg/dL      Globulin 3.3 g/dL      Albumin/Globulin Ratio 1.2     Anion Gap 19.0     eGFR 20.9 mL/min/1.73 m2     Magnesium [id number]  (Abnormal)  Collected: 12/20/22 2034     Updated: 12/20/22 2152     Magnesium 2.9 mg/dL     NT-proBNP [[id number]]  (Abnormal) Collected: 12/20/22 2034     Updated: 12/20/22 2133     NT-proBNP 26,524 pg/mL     Cell MorpHology [id number]  (Abnormal) Collected: 12/20/22 2034     Updated: 12/20/22 2130     Cell Morphology Abnormal     Platelet Estimate Normal     Macrocytic 2+     Polychromasia Present    CBC and differential [id number]  (Abnormal) Collected: 12/20/22 2034    Specimen: Blood Updated: 12/20/22 2130     WBC 12.60 x10 3/uL      Hgb 14.6 g/dL      Hematocrit 45.2 %      Platelets 161 x10 3/uL      RBC 4.41 x10 6/uL      MCV 102.5 fL      MCH 33.1 pg      MCHC 32.3 g/dL  RDW 15 %      MPV 12.5 fL      Instrument Absolute Neutrophil Count 4.21 x10 3/uL      Neutrophils 33.4 %      Lymphocytes Automated 64.2 %      Monocytes 1.4 %      Eosinophils Automated 0.2 %      Basophils Automated 0.2 %      Immature Granulocytes 0.6 %      Nucleated RBC 0.6 /100 WBC      Neutrophils Absolute 4.21 x10 3/uL      Lymphocytes Absolute Automated 8.09 x10 3/uL      Monocytes Absolute Automated 0.18 x10 3/uL      Eosinophils Absolute Automated 0.03 x10 3/uL      Basophils Absolute Automated 0.02 x10 3/uL      Immature Granulocytes Absolute 0.07 x10 3/uL      Absolute NRBC 0.08 x10 3/uL     High Sensitivity Troponin-I at 0 hrs [id number]  (Abnormal) Collected: 12/20/22 2034    Specimen: Blood Updated: 12/20/22 2121     hs Troponin-I 192.4 ng/L     i-Stat Chem 8 CartrIDge [id number]  (Abnormal) Collected: 12/20/22 2038     Updated: 12/20/22 2057     Glucose I-Stat 104 mg/dL      BUN I-Stat 45 mg/dL      Creatinine I-Stat 3.2 mg/dL      Sodium I-Stat 140 mEq/L      Potassium I-Stat 4.4 mEq/L      Chloride I-Stat 106 mEq/L      i-STAT CO2 21 mEq/L      Hematocrit I-Stat 49.0 %      Hemoglobin I-Stat 16.7 g/dL      i-STAT Calcium Ionized 2.30 mEq/L      eGFR 19.1 mL/min/1.73 m2             RADIOLOGY IMAGING STUDIES    Ordered and  independently interpreted AVAILABLE imaging studies.   Chest AP Portable   Final Result      No acute intrathoracic process.      Montine Circle, MD   12/20/2022 9:12 PM                PRIMARY PROBLEM LIST         Acute illness/injury with risk to life or bodily function (based on differential diagnosis or evaluation)    Differential Diagnosis includes but not limited to:  Arrhythmia: A-fib with RVR, V-tach, ventricular fibrillation, Torsades de Pointes, Sinus tach, A flutter, SVT, electrolyte abnormalities    Chronic Illness Impacting Care of the above problem: Coronary Artery Disease and Congestive Heart Failure   Increases complexity of evaluation, Increases the risk of severe disease, and Increase the risk of disease progression        Independent interpretation of  EKG:   Signed and interpreted by the EP.    Time Interpreted: 2014   Rate:  158   Rhythm: Supraventricular tachycardia   Axis: Left axis deviation   Interval: normal   Blocks: none   ST segment:.  Inferior infarct age undetermined   Interpretation: SVT    EKG Interpretation:    Signed and interpreted by the EP.    Time Interpreted: 2045   Rate:  101   Rhythm: ventricular paced rhythm with PVC   Axis: normal   Interval: normal   Blocks: none   ST segment: no acute ischemic changes   Interpretation: ventricular paced rhythm  Independent visualized and interpretation of radiological study by me:   Chest Xray Interpreted by me (ED provider) SIDE : N/A  RESULT: No infiltrate. No pneumothorax. No large hemothorax. No CHF.  IMPRESSION: No acute abnormality    Was management discussed with a consultant?: Yes (explain)  Cardiologist Dr. Melina Copa            ED Course as of 12/21/22 0348   Sun Dec 20, 2022   2014 Pt in SVT, during attempts to place IVs, attempted vagal maneuvers without change. Pt suddenly c/o feeling worse, feeling faint, BP decreasing, and pt deteriorating decision made to synchronize cardiovert.   Given one shock at 120J with no change in  rhythm. AT this point an IV was establish, decision made give '6mg'$  adenosine with no change in rhythm. While preparing to given 2nd dose of adenosine, pt's rhythm suddenly changed for a ventricular paced rhythm with improvement in his BP.  [SL]   2101 Discussed with cardiologist Dr. Melina Copa, recommend admission, will consult.  [SL]   2321 Discussed with [facility] critical care NP and ICU Dr. Bonnye Fava, accepted patient for admission. [SL]      ED Course User Index  [SL] Burtis Junes, MD         ______________________________________________________________________    Risk of Complications and/or Morbidity or Mortality of Patient Management  (select one if applicable)  Risk: High Risk and Decision regarding hospitalization or escalation of hospital level of care        Additional Notes                                        DIAGNOSIS      Diagnosis:  Final diagnoses:   SVT (supraventricular tachycardia)   Acute renal failure superimposed on chronic kidney disease, unspecified acute renal failure type, unspecified CKD stage       Disposition:  ED Disposition       ED Disposition   Admit    Condition   --    Date/Time   Sun Dec 20, 2022 11:38 PM    Comment   Admitting Physician: Dolores Lory I Rosella.Millin   Service:: Medicine [106]   Estimated Length of Stay: 5 - 7 Days   Tentative Discharge Plan?: Home or Self Care [1]                 Prescriptions:  Current Discharge Medication List        CONTINUE these medications which have NOT CHANGED    Details   apixaban (ELIQUIS) 5 MG Take 1 tablet (5 mg) by mouth every 12 (twelve) hours For 1 week followed by 1 tablet twice daily  Qty: 90 tablet, Refills: 2      aspirin EC 81 MG EC tablet Take 1 tablet (81 mg) by mouth daily      atorvastatin (LIPITOR) 40 MG tablet Take 1 tablet (40 mg total) by mouth nightly  Qty: 90 tablet, Refills: 0    Comments: PLEASE ADVISE PATIENT TO SCHEDULE OVERDUE FOLLOW-UP WITH GENERAL CARDIOLOGY (DR. Camille Bal) FOR CONTINUED  MANAGEMENT/REFILLS.  Associated Diagnoses: Mixed hyperlipidemia      Calquence 100 MG Tab       hydrALAZINE (APRESOLINE) 25 MG tablet Take 1 tablet (25 mg) by mouth every 8 (eight) hours  Qty: 90 tablet, Refills: 2      Insulin Glargine-yfgn (Semglee, yfgn,) 100 UNIT/ML Solution Pen-injector  50 Units by Subdermal route daily  Qty: 45 mL, Refills: 3    Associated Diagnoses: Type 2 diabetes mellitus with complication, with long-term current use of insulin      isosorbide dinitrate (ISORDIL) 10 MG tablet Take 1 tablet (10 mg) by mouth 3 (three) times daily  Qty: 90 tablet, Refills: 2      Jardiance 10 MG tablet TAKE 1 TABLET(10 MG) BY MOUTH DAILY  Qty: 90 tablet, Refills: 3    Associated Diagnoses: Non-ischemic cardiomyopathy; Chronic systolic congestive heart failure      loratadine (CLARITIN) 10 MG tablet Take 1 tablet (10 mg) by mouth daily  Qty: 5 tablet, Refills: 0      metoprolol succinate XL (TOPROL-XL) 25 MG 24 hr tablet Take 0.5 tablets (12.5 mg) by mouth daily  Qty: 45 tablet, Refills: 3      niacin (NIASPAN) 500 MG CR tablet TAKE 1 TABLET BY MOUTH TWICE DAILY  Qty: 180 tablet, Refills: 3      torsemide (DEMADEX) 20 MG tablet Take 2 tablets (40 mg) by mouth 2 (two) times daily  Qty: 540 tablet, Refills: 3                 Critical Care    Performed by: Burtis Junes, MD  Authorized by: Burtis Junes, MD    Critical care provider statement:     Critical care time (minutes):  60    Critical care was necessary to treat or prevent imminent or life-threatening deterioration of the following conditions:  Cardiac failure and circulatory failure    Critical care was time spent personally by me on the following activities:  Ordering and performing treatments and interventions, ordering and review of laboratory studies, ordering and review of radiographic studies, re-evaluation of patient's condition, review of old charts, examination of patient, evaluation of patient's response to treatment, discussions with  consultants, development of treatment plan with patient or surrogate and obtaining history from patient or surrogate    Care discussed with: admitting provider    Cardioversion    Date/Time: 12/20/2022 8:20 PM    Performed by: Burtis Junes, MD  Authorized by: Burtis Junes, MD    Consent:     Consent obtained:  Emergent situation and verbal    Consent given by:  Patient  Pre-procedure details:     Cardioversion basis:  Emergent    Rhythm:  Supraventricular tachycardia    Electrode placement:  Anterior-lateral  Attempt one:     Cardioversion mode:  Synchronous    Shock (Joules):  120    Shock outcome:  No change in rhythm  Post-procedure details:     Patient status:  Awake    Procedure completion:  Tolerated well, no immediate complications        Documentation:      Parts of this note were generated by the Epic EMR system/ Dragon speech recognition and may contain inherent errors or omissions not intended by the user. Grammatical errors, random word insertions, deletions, pronoun errors and incomplete sentences are occasional consequences of this technology due to software limitations. Not all errors are caught or corrected.     My documentation is often completed after the patient is no longer under my clinical care. In some cases, the Epic EMR may pull updated results into the above documentation which may not reflect all results or information that was available to me at the time of my medical decision making.  Burtis Junes, MD  12/21/22 [phone]

## 2022-12-21 NOTE — Progress Notes (Signed)
1814: 6 beat run of vtach on monitor. Dr. Rolland Bimler notified.

## 2022-12-21 NOTE — Consults (Signed)
[facility]  Consultation    Date Time: 12/21/22 11:47 AM  Patient Name: [name] [name], [name] [name]  Medical Record Number: [id number]   Primary Care Physician: Clearance Coots, MD  Requesting Physician: Linus Salmons, MD    Reason for Consultation:   AKI  Assessment:   Oliguric AKI 2 to  cardiogenic shock  Hyperkalemia  Metabolic acidosis  Cardiogenic shock  CHF  HFrEF  Hypertension  Hyperlipidemia  Plan:   To be evaluated by [facility]  Suggest CRRT - D/W pt and wife  May need to be transferred to [facility] for LVAD    We will follow this patient closely with you. Thank you for allowing [place] to participate in the care of this patient.    Queen Slough, MD  [employer] Chat  Office - [phone]  History:   [name] [name] is a 77 y.o. 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    Coronary artery disease 2011    CORONARY STENT PLACEMENT FOLLOWED BY [facility]    Depression     echocardiogram 02/2016, 11/2016, 09/2017, 12/20/2017    GERD (gastroesophageal reflux disease)     Heart attack 2011    and 2014    Hyperlipemia     Hypertension     ICD (implantable 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     PLACED 2014  [employer] INTERROGATION April 01 2018 REPORT REQUESTED    Ischemic cardiomyopathy     EF 15% ON ECHO 09-20-2017 DR GARG IN EPIC    Nonischemic cardiomyopathy     NSTEMI (non-ST elevated myocardial infarction)     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    Pacemaker 2014     MEDTR[employer]IC ICD/PACEMAKER COMBO LAST INTERROGATION  12-12 2018    Pneumonia 09/2016    Primary cardiomyopathy     Ischemic cardiomyopathy EF 15% ON ECHO 09-20-2017 DR GARG IN EPIC    Sepsis 08/2017    Syncope and collapse     PRIOR TO ICD/PACEMAKER PLACED 2014    Type 2 diabetes mellitus, controlled DX 1998    BS  AVG 100  A1C 7.4 Dec 27 2017    [place]     PT SEE HIS PMD April 01 2018 RE THIS-TOLD TO SEE PMD BY [facility] JUNE 12 WHEN THEY SAW HIM FOR A CL     Past Surgical History:   Procedure Laterality Date    BIV  11/10/2016    ICD METRONIC  UPGRADE     CARDIAC CATHETERIZATION  03/2010    CORONARY STENT PLACEMENT X 2 PER PT; LM normal, LAD 70-80% distal lesion at apex, 20% lesion mid CFX    CARDIAC CATHETERIZATION  12/2012    CARDIAC DEFIBRILLATOR PLACEMENT  12/2012    [facility]    CARDIAC PACEMAKER  PLACEMENT  2014    MEDTRONIC ICD/PACEMAKER PLACED [facility]    CIRCUMCISION  AGE 30    COLONOSCOPY  2009    CORRECTION HAMMER TOE      duodenal ulcer  1973    STRESS RELATED IN SCHOOL    ECHOCARDIOGRAM, TRANSTHORACIC  02/2016 11/2016 09/2017 12/2017    ECHOCARDIOGRAM, TRANSTHORACIC  02/2016,11/2016,12/20/2017    EGD N/A 08/15/2014    Procedure: EGD;  Surgeon: Loraine Maple, MD;  Location: [facility];  Service: Gastroenterology;  Laterality: N/A;  egd w/ bx    EGD  1975    EGD, COLONOSCOPY N/A 04/04/2018    Procedure: EGD with bxs, COLONOSCOPY with polypectomy and clipping;  Surgeon: Omer Jack, MD;  Location: [facility];  Service: Gastroenterology;  Laterality: N/A;    FRACTURE SURGERY  AGE 21     RT  ANKLE- CAST APPLIED    HERNIA REPAIR  AGE 30    LEFT INGUINAL HERNIA    MPI nuclear study  06/2016    orthopedic surgery  AGE 21    RT foot corrective - HAMMERTOE    RIGHT HEART CATH Right 09/09/2022    Procedure: Right Heart Cath;  Surgeon: Wandra Arthurs, MD;  Location: [facility];  Service: Cardiovascular;  Laterality: Right;  tx from [place]      Family History:     Family History   Problem Relation Age of Onset    Breast cancer Mother     Heart attack Mother 45    Hypertension Mother     Diabetes Mother     Coronary artery disease Mother     Diabetes Brother     Heart attack Father     Hypertension Father      Social History:     Social History     Socioeconomic History    Marital status: Married     Spouse name: Architect    Number of children: 0   Occupational History    Occupation: Therapist, [place], [place]    Tobacco Use    Smoking status: Never    Smokeless tobacco: Never   Vaping Use    Vaping Use: Never used   Substance and Sexual Activity    Alcohol use: Not Currently    Drug use: No    Sexual activity: Yes     Partners: Female     Social Determinants of Health     Food Insecurity: No Food Insecurity (09/12/2022)    Hunger Vital Sign     Worried About Running Out of Food in the Last Year: Never true     [place] in the Last Year: Never true   Transportation Needs: No Transportation Needs (09/12/2022)    PRAPARE - Armed forces logistics/support/administrative officer (Medical): No     Lack of Transportation (Non-Medical): No   Intimate Partner Violence: Not At Risk (09/12/2022)    Humiliation, Afraid, Rape, and Kick questionnaire     Fear of Current or Ex-Partner: No     Emotionally Abused: No     Physically Abused: No     Sexually Abused: No   Housing Stability: Low Risk  (09/12/2022)    Housing Stability Vital Sign     Unable to Pay for Housing in the Last Year: No     Number of Places Lived in the Last Year: 1     Unstable  Housing in the Last Year: No     Allergies:     Allergies   Allergen Reactions    Allopurinol      Medications:     Current Facility-Administered Medications   Medication Dose Route Frequency    apixaban  5 mg Oral Q12H    aspirin EC  81 mg Oral Daily    atorvastatin  40 mg Oral QHS    hydrALAZINE  25 mg Oral Q8H [place]    isosorbide dinitrate  10 mg Oral TID - Nitrate Free Interval    metoprolol succinate XL  12.5 mg Oral Daily     niacin  500 mg Oral BID    senna-docusate  1 tablet Oral Q12H [facility]     Physical Exam:   Temp:  [96.1 F (35.6 C)-98.8 F (37.1 C)] 98.8 F (37.1 C)  Heart Rate:  [85-160] 89  Resp Rate:  [4-36] 4  BP: (89-138)/(70-89) 102/80  Intake and Output Summary (Last 24 hours) at Date Time    Intake/Output Summary (Last 24 hours) at 12/21/2022 1147  Last data filed at 12/21/2022 1100  Gross per 24 hour   Intake 720 ml   Output 50 ml   Net 670 ml       Labs:     Recent Labs   Lab 12/21/22  1050 12/21/22  0416 12/20/22  2038 12/20/22  2034   Glucose 97 75  --  104*   BUN 53.0* 51.0*  --  48.0*   Creatinine 3.9* 3.3*  --  3.0*   Creatinine I-Stat  --   --  3.2*  --    Calcium 9.4 9.6  --  10.1   Sodium 137 141  --  142   Potassium 5.6* 5.8*  --  4.5   Chloride 105 108  --  106   CO2 18 17  --  17   Albumin  --  3.7  --  4.1   Magnesium  --  3.2*  --  2.9*     Recent Labs   Lab 12/21/22  0416 12/20/22  2034   WBC 13.49* 12.60*   RBC 4.32 4.41   Hgb 13.9 14.6   Hematocrit 44.2 45.2   MCV 102.3* 102.5*   MCH 32.2 33.1   MCHC 31.4* 32.3   RDW 16* 15   MPV 12.6* 12.5   Platelets 159 161     Radiology:   Radiological Procedure personally reviewed.   EKG:   Reviewed  Prior Records:   I reviewed the old records.

## 2022-12-21 NOTE — Student Other (Incomplete)
Assessment/Plan:  Neuro:Generalized fatigue  -No focal deficits    Resp: Hx of COPD  -Recent respiratory symptoms  -CXR negative for congestion, pleural effusion or pneumothorax  -Viral panel pending    Cardio: Hx of NICM/ICM  -Continue GDMT: Metoprolol, jardiance, torsemide, Entresto  on hold due to poor renal function  -Pending rehab/optimization for LVAD implantation  Afterload reduction:  -Continue Hydralazine and Isosorbide  Hx of Dyslipidemia  - Continue Atorvastatin    GI: No concerns  LBM 3/3    Nutrition:  Clear liquid diet    Renal/fluid/lytes:Hyperkalemia, ARF  -Cont Bicarb infusion  -Appreciate nephrology recommendations  ID:  Hem/Onc:  Endo:  Prophylaxis:  GI: Pericolace  DVT: Apixaban        Principal Problem:    SVT (supraventricular tachycardia)       LOS: 1 day     Subjective:  Feeling fatigued  Interval History: .     Objective:  Vital signs for last 24 hours:  Temp:  [96.1 F (35.6 C)-98.3 F (36.8 C)] 96.2 F (35.7 C)  Heart Rate:  [85-160] 88  Resp Rate:  [10-36] 13  BP: (89-138)/(70-89) 104/84    Scheduled Meds:  Current Facility-Administered Medications   Medication Dose Route Frequency    apixaban  5 mg Oral Q12H    aspirin EC  81 mg Oral Daily    atorvastatin  40 mg Oral QHS    hydrALAZINE  25 mg Oral Q8H [place]    isosorbide dinitrate  10 mg Oral TID - Nitrate Free Interval    metoprolol succinate XL  12.5 mg Oral Daily    niacin  500 mg Oral BID    senna-docusate  1 tablet Oral Q12H [place]     Continuous Infusions:   dextrose 5 % 1,000 mL with sodium bicarbonate 100 mEq infusion 60 mL/hr (12/21/22 0646)     PRN Meds:    Intake/Output last 3 shifts:  I/O last 3 completed shifts:  In: -   Out: 50 [Urine:50]  Intake/Output this shift:  I/O this shift:  In: 480 [P.O.:480]  Out: -     Vent settings for last 24 hours:       Hemodynamic parameters for last 24 hours:       {Exam; Complete Normal And System Select:17964}      Additional comments:{:30782}    Critical Care Time greater than: {time  intervals (quarter hr to 2h):31374}  Total time spent with patient greater than: {time intervals (quarter hr to BQ:[id number]

## 2022-12-21 NOTE — Significant Event (Signed)
The patient has not had any further episodes of supraventricular tachycardia.  He is oliguric.  Potassium was 5.8, creatinine increased to 3.3.  No respiratory distress.  Blood pressure adequate 101/82, pulse 88    Will give bicarb infusion carefully monitor electrolytes.  Hold diuresis today.  Cardiology and nephrology consults.      Signed: Dolores Lory, MD      Critical care time: 25 minutes

## 2022-12-21 NOTE — Progress Notes (Signed)
Patient being transferred to [facility] [place] room 108. Report given to MMT, dobutamine infusing at ordered rate. Vital signs stable. All belongings sent home with wife.

## 2022-12-21 NOTE — Nursing Progress Note (Signed)
4 eyes in 4 hours pressure injury assessment note:      Completed with: Miguel Aschoff  Unit & Time admitted:  MICU   3/4 0030           Bony Prominences: Check appropriate box; if wound is present enter wound assessment in LDA     Occiput:                 '[x]'$ WNL  '[]'$  Wound present  Face:                     '[x]'$ WNL  '[]'$  Wound present  Ears:                      '[x]'$ WNL  '[]'$  Wound present  Spine:                    '[x]'$ WNL  '[]'$  Wound present  Shoulders:             '[x]'$ WNL  '[]'$  Wound present  Elbows:                  '[x]'$ WNL  '[]'$  Wound present  Sacrum/coccyx:     '[x]'$ WNL  '[]'$  Wound present  Ischial Tuberosity:  '[x]'$ WNL  '[]'$  Wound present  Trochanter/Hip:      '[x]'$ WNL  '[]'$  Wound present  Knees:                   '[x]'$ WNL  '[]'$  Wound present  Ankles:                   '[x]'$ WNL  '[]'$  Wound present  Heels:                    '[x]'$ WNL  '[]'$  Wound present  Other pressure areas:  '[]'$  Wound location       Device related: '[]'$  Device name:         LDA completed if wound present: yes/no  Consult WOCN if necessary    Other skin related issues, ie tears, rash, etc, document in Integumentary flowsheet

## 2022-12-21 NOTE — Student Other (Addendum)
HPI:  77 year old male with PM Hx of nonischemic and ischemic cardiomyopathy with EF of 21% in November 2023, COPD, DM, CLL and renal failure currently being optimized on GDMT for possible LVAD at [facility]. Presented after several days in SVT and worsening fatigue. Had one failed shock, then Adenosine 6 mg was administered with conversion to NSR.    Assessment and Plan:  Neuro:   Generalized fatigue  -No focal deficit  -Monitor for ICU delirium    Resp: Hx of COPD  CXR  -No focal consolidation, pleural effusion or pneumothorax.     Cardio:   Hx of SVT  AICD in situ  -Currently V-Paced  - Monitor for SVT    Hx of HF/NICM/ICM  Not a candidate for transplant due to advanced age and other co-morbidities  -Currently on optimization/evaluation for LVAD implantation at [facility]  -Echocardiogram    ACC/AHA Stage C HF/ NYHA Class IV/HFrEF  -Continue Metoprolol  Cardiogenic shock  -Start Dobutamine 2.5 mcg/kg/min  -Appreciate AHF recommendation for possible transfer to [facility]  Not a candidate for transplant  -Currently on optimization for LVAD implantation at [facility]  Hypotension  -Hold GDMT  -Hold hydralazine and Isosorbide  Hypervolemia  -Hold fluids  Hx of Hyperlipidemia  -Continue Atorvastatin  Hx of CAD  -Continue ASA  Hx of DVT  -Continue Apixaban  -Monitor for bleeding    [id number]:  -Clear liquid diet    Renal/Fluids/Lytes-Hypervolemia  -Fluid restriction 1500 ml/24 hours  ARF with hyperkalemia  -Continue D5 Sodium bicarbonate   Oliguria  -Monitor intake and output  Endo:  Hx of Diabetes  - AC/HS BG with medium dose SSI  -Continue Lantus?    Prophylaxis:  GI: Continue Pericolace  VTE: Continue Eliquis         LOS: 1 day   Subjective:  Interval History: .     Objective:  Vital signs for last 24 hours:  Temp:  [96.1 F (35.6 C)-98.8 F (37.1 C)] 98.8 F (37.1 C)  Heart Rate:  [85-160] 85  Resp Rate:  [4-36] 12  BP: (89-138)/(70-89) 111/83    Scheduled Meds:  Current Facility-Administered Medications   Medication  Dose Route Frequency    apixaban  5 mg Oral Q12H    aspirin EC  81 mg Oral Daily    atorvastatin  40 mg Oral QHS    hydrALAZINE  25 mg Oral Q8H [place]    isosorbide dinitrate  10 mg Oral TID - Nitrate Free Interval    metoprolol succinate XL  12.5 mg Oral Daily    niacin  500 mg Oral BID    senna-docusate  1 tablet Oral Q12H [place]     Continuous Infusions:   dextrose 5 % 1,000 mL with sodium bicarbonate 100 mEq infusion 60 mL/hr (12/21/22 0646)    DOBUTamine 2.5 mcg/kg/min (12/21/22 1419)     PRN Meds:    Intake/Output last 3 shifts:  I/O last 3 completed shifts:  In: -   Out: 50 [Urine:50]  Intake/Output this shift:  I/O this shift:  In: 7 [P.O.:720]  Out: -     A comprehensive review of systems was: ENT ROS: negative for - epistaxis, headaches, nasal congestion, nasal polyps, oral lesions, sore throat, vertigo, visual changes or vocal changes  Hematological and Lymphatic ROS: negative for - bleeding problems, blood clots, jaundice, swollen lymph nodes or weight loss  Respiratory ROS: Mild upper airway congestion  Cardiovascular ROS: Palpitations.  Resolved.  No chest pain gastrointestinal  ROS: negative for - abdominal pain, appetite loss, blood in stools, change in bowel habits, diarrhea, nausea/vomiting or swallowing difficulty/pain  Genito-Urinary ROS: negative for - dysuria, hematuria or urinary frequency/urgency.  No edema  Musculoskeletal ROS: negative for - joint pain, joint swelling or muscular weakness  Neurological ROS: negative for - confusion, gait disturbance, seizures or weakness  Dermatological ROS: negative for rash and skin lesion changes     General appearance: alert, appears stated age, cooperative, and fatigued  Lungs: clear to auscultation bilaterally and normal percussion bilaterally  Chest wall: no tenderness  Heart: regular rate and rhythm, S1, S2 normal, no murmur, click, rub or gallop, regular rate and rhythm, no click, and no rub  Abdomen: soft, non-tender; bowel sounds normal; no masses,   no organomegaly  Extremities: 1+ edema to BLE  Pulses: 2+ and symmetric on upper extremities  Doppler BLE  Skin: Skin color, texture, turgor normal. No rashes or lesions  Neurologic: Alert and oriented X 3, normal strength and tone. Normal symmetric reflexes. Normal coordination and gait

## 2022-12-21 NOTE — H&P (Cosign Needed)
Cardiac Intensive Care Unit (CICU)  History and Physical        Patient Name: [name] [name]  MRN: [id number]  Room: FI102/FI102-01    Chief Complaint    Cardiogenic shock SCAI stage C/D    History of Present Illness   [name] [name] is a 77 y.o. male with PMHx NICM/ICM s/p Medtronik ICD (placed 2013, replaced 2018), CAD s/p PCI with DES 2011, HLD, T2DM, COPD, stage IV CLL, CKD, RLE DVT on Eliquis who presents to [facility] ED on night of 03/03 with dyspnea, burning chest pain.  Found to be in SVT with HR 158 BPM, received unsuccessful cardioversion at 120 J.  Subsequently given adenosine 6 mg IV x 1, rhythm slowed and was found to be in atrial sensed ventricular paced rhythm with rate less than 100 BPM. Admitted to ICU at [facility] for further management. However, then transferred to [place] on new Dobutamine 2.5 mcg/kg/min for LVAD consideration.     Of note follows with [place] [facility]. Has been followed by [facility] for advanced therapies, including LVAD where he has been seen by Dr. Alain Marion. Home medications include Eliquis 2.5 mg BID, ASA 81 mg QD, Lipitor 40 mg QHS, Jardiance 10 mg QD, Hydralazine 25 mg Q8H, Isosorbide Dinitrate 10 mg TID, metoprolol succinate XL 12. 5 mg QD, torsemide 40 mg BID, Lantus 50U QD. Was previously on Aldactone but discontinued given renal function. Dry weight is around 185 lb per 10/2022, weight today around 195-199 lb.    Follows with Dr. Mervin Kung of [facility] for CLL. Dx'd in 06/2020. Thought to be well controlled on acalabrutinib.    Follows with [facility] for CKD. Cr increased from 1.5 in 03/2022 to 2.3 in 09/2022. Previously on HD/UF in 2018.     Assessment & Plan   Assessment and Plan   Summary: [name] [name] a 77 y.o. male with PMHx NICM/ICM s/p Medtronik ICD (placed 2013, replaced 2018), CAD s/p PCI with DES 2011, T2DM, COPD, stage IV CLL, CKD, RLE DVT on Eliquis initially presented to [facility] ED with tachycardia, chest pain,  found to be in SVT s/p unsuccessful cardioversion with return to NSR s/p adenosine. Since, started on dobutamine and transferred to [place] for LVAD workup/evaluation.    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  Severe TR  Moderate pHTN  SVT s/p unsuccessful cardioversion 3/4     Ejection Fraction          09/08/2022    11:19   Ejection Fraction   Ejection Fraction 20.614     Preload: at home takes torsemide 40 mg BID, he was given lasix bolus prior to discharge from [facility]  Afterload: continue home hydralazine 25 mg Q8H, and isordil 10 mg TID, hold BB  MCS: None  Inotropy: dobutamine 2.5 mcg/kg/min  Device: Medtronik ICD  Latest TTE: 3/4 with EF 11%, severe global hypokinesis with regional variation, LV moderately dilated with LVIDd 6.8 cm, RV mildly dilated and moderately decreased function  Advanced therapies: was undergoing evaluation for LVAD  Continue ASA 81 mg QD and Lipitor 40 mg QHS  Hold eliquis and start heparin infusion, VTE protocol   Blood Pressure Goal: MAP > 65  Lipids in AM    PULMONARY:   Hx of COPD    Last vent settings if on vent:     Ventilator Time (if on vent):     CXR with mild pulmonary edema  Supplemental oxygen as needed to keep sat > 90%  Diuresis     RENAL:   Oliguric AKI on CKD likely from cardiorenal    No intake or output data in the 24 hours ending 12/21/22 1627 Follows with [facility], consulted and appreciate recommendations  Cr increased from 1.5 in 03/2022 to 2.3 in 09/2022, on on admission 3.9  Follow BMP and mag q8hr while diuresing     GASTROINTESTINAL:  Transaminitis in setting of cardioversion    Last Bowel Movement:   Last BM Date:  (PTA) LFT's daily  Nutrition: NPO for now   Bowel Regimen: senna/docusate     INFECTIOUS DISEASE:   Leukocytosis    Temp (24hrs), Avg:97.1 F (36.2 C), Min:96.1 F (35.6 C), Max:98.8 F (37.1 C)     * No surgery found *  Lab Results   Component Value Date    WBC 13.49 (H) 12/21/2022      CAUTI Prevention (n/a if blank):  Foley Day:         Blood Steam Infection Prevention (n/a if blank):    COVID/flu negative  Last culture dates: Blood cultures 3/4, MRSA 3/4  Likely reactive, hold off on antibiotics for now.           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  Hx of CLL, on acalabrutinib     Hold Eliquis and transition to heparin gtt  SCDs  Iron profile, type and screen, and anti Xa    ENDO/RHEUM:  T2DM  Sliding scale  HgbA1C  Goal glucose range 100 - 180       Current Infusions (n/a if blank): dobutamine       Next of Kin Updated: Open ACP Navigator   Primary Emergency Contact: Marlett,Octavia  Yes    Code Status: Full Code            Subjective   History     Past Medical History:     Past Medical History:   Diagnosis Date    Acute CHF     NOV  & DEC 2018, - DIALYSIS CATH PLACED Aug 24 2017 TO REMOVE FLUID     Acute systolic (congestive) heart failure 08/2017    Anxiety     CAD (coronary artery disease)     Cardiomyopathy     nonischemic    CHF (congestive heart failure) 08/12/2014, 2013    Chronic obstructive pulmonary disease     POSSIBLE PER PT HE USES  SYNBICORT BID ABD  BREO INHALER PRN    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    Coronary artery disease 2011    CORONARY STENT PLACEMENT FOLLOWED BY [facility]    Depression     echocardiogram 02/2016, 11/2016, 09/2017, 12/20/2017    GERD (gastroesophageal reflux disease)     Heart attack 2011    and 2014    Hyperlipemia     Hypertension     ICD (implantable 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     PLACED 2014  [facility]  LAST  INTERROGATION April 01 2018 REPORT REQUESTED    Ischemic cardiomyopathy     EF 15% ON ECHO 09-20-2017 DR GARG IN EPIC    Nonischemic cardiomyopathy     NSTEMI (non-ST elevated myocardial infarction)     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    Pacemaker 2014    MEDTRONIC ICD/PACEMAKER COMBO LAST INTERROGATION  12-12 2018    Pneumonia 09/2016    Primary cardiomyopathy     Ischemic cardiomyopathy EF 15% ON ECHO 09-20-2017 DR GARG IN EPIC    Sepsis 08/2017    Syncope and collapse     PRIOR TO ICD/PACEMAKER PLACED 2014    Type 2 diabetes mellitus, controlled DX 1998    BS  AVG 100  A1C 7.4 Dec 27 2017    [place]     PT SEE HIS PMD April 01 2018 RE THIS-TOLD TO SEE PMD BY [facility] JUNE 12 WHEN THEY SAW HIM FOR A CL       Past Surgical History:     Past Surgical History:   Procedure Laterality Date    BIV  11/10/2016    ICD METRONIC  UPGRADE     CARDIAC CATHETERIZATION  03/2010    CORONARY STENT PLACEMENT X 2 PER PT; LM normal, LAD 70-80% distal lesion at apex, 20% lesion mid CFX    CARDIAC CATHETERIZATION  12/2012    CARDIAC DEFIBRILLATOR PLACEMENT  12/2012    [facility]    CARDIAC PACEMAKER PLACEMENT  2014    MEDTRONIC ICD/PACEMAKER PLACED [facility]    CIRCUMCISION  AGE 10    COLONOSCOPY  2009    CORRECTION HAMMER TOE      duodenal ulcer  1973    STRESS RELATED IN SCHOOL    ECHOCARDIOGRAM, TRANSTHORACIC  02/2016 11/2016 09/2017 12/2017    ECHOCARDIOGRAM, TRANSTHORACIC  02/2016,11/2016,12/20/2017    EGD N/A 08/15/2014    Procedure: EGD;  Surgeon: Loraine Maple, MD;  Location: [facility];  Service: Gastroenterology;  Laterality: N/A;  egd w/ bx    EGD  1975    EGD, COLONOSCOPY N/A 04/04/2018    Procedure: EGD with bxs, COLONOSCOPY with polypectomy and clipping;  Surgeon: Omer Jack, MD;  Location: [facility];  Service: Gastroenterology;  Laterality: N/A;    FRACTURE SURGERY  AGE 66     RT  ANKLE- CAST APPLIED    HERNIA REPAIR  AGE 10    LEFT INGUINAL HERNIA    MPI nuclear study  06/2016    orthopedic surgery  AGE 69    RT  foot corrective - [facility]    RIGHT HEART CATH Right 09/09/2022    Procedure: Right Heart Cath;  Surgeon: Wandra Arthurs, MD;  Location: [facility];  Service: Cardiovascular;  Laterality: Right;  tx from [place]       Social History:     Social History     Socioeconomic History    Marital status: Married     Spouse name: Octavia    Number of children: 0    Years of education: Not on file    Highest education level: Not on file   Occupational History    Occupation: Pharmacist, 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[employer], history    Tobacco Use    Smoking status: Never    Smokeless tobacco: Never   Vaping Use    Vaping Use: Never used   Substance and Sexual Activity    Alcohol  use: Not Currently    Drug use: No    Sexual activity: Yes     Partners: Female   Other Topics Concern    Not on file   Social History Narrative    Not on file     Social Determinants of Health     Financial Resource Strain: Not on file   Food Insecurity: No Food Insecurity (09/12/2022)    Hunger Vital Sign     Worried About Running Out of Food in the Last Year: Never true     Ran Out of Food in the Last Year: Never true   Transportation Needs: No Transportation Needs (09/12/2022)    PRAPARE - Armed forces logistics/support/administrative officer (Medical): No     Lack of Transportation (Non-Medical): No   Physical Activity: Not on file   Stress: Not on file   Social Connections: Not on file   Intimate Partner Violence: Not At Risk (09/12/2022)    Humiliation, Afraid, Rape, and Kick questionnaire     Fear of Current or Ex-Partner: No     Emotionally Abused: No     Physically Abused: No     Sexually Abused: No   Housing Stability: Low Risk  (09/12/2022)    Housing Stability Vital Sign     Unable to Pay for Housing in the Last Year: No     Number of Places Lived in the Last Year: 1     Unstable Housing in the Last Year: No       Family History:     Family History   Problem Relation Age of Onset    Breast cancer Mother     Heart attack Mother 36     Hypertension Mother     Diabetes Mother     Coronary artery disease Mother     Diabetes Brother     Heart attack Father     Hypertension Father        Allergies:     Allergies   Allergen Reactions    Allopurinol       Home Medications:     Home Medications               apixaban (ELIQUIS) 5 MG     Take 1 tablet (5 mg) by mouth every 12 (twelve) hours For 1 week followed by 1 tablet twice daily     aspirin EC 81 MG EC tablet     Take 1 tablet (81 mg) by mouth daily     atorvastatin (LIPITOR) 40 MG tablet     Take 1 tablet (40 mg total) by mouth nightly     Notes:  PLEASE ADVISE PATIENT TO SCHEDULE OVERDUE FOLLOW-UP WITH GENERAL CARDIOLOGY (DR. Camille Bal) FOR CONTINUED MANAGEMENT/REFILLS.     Calquence 100 MG Tab          hydrALAZINE (APRESOLINE) 25 MG tablet (Expired)     Take 1 tablet (25 mg) by mouth every 8 (eight) hours     Insulin Glargine-yfgn (Semglee, yfgn,) 100 UNIT/ML Solution Pen-injector     50 Units by Subdermal route daily     isosorbide dinitrate (ISORDIL) 10 MG tablet (Expired)     Take 1 tablet (10 mg) by mouth 3 (three) times daily     Jardiance 10 MG tablet     TAKE 1 TABLET(10 MG) BY MOUTH DAILY     loratadine (CLARITIN) 10 MG tablet     Take 1 tablet (10 mg) by mouth  daily     metoprolol succinate XL (TOPROL-XL) 25 MG 24 hr tablet     Take 0.5 tablets (12.5 mg) by mouth daily     niacin (NIASPAN) 500 MG CR tablet     TAKE 1 TABLET BY MOUTH TWICE DAILY     Patient taking differently: 250 mg Taking half a tablet     torsemide (DEMADEX) 20 MG tablet     Take 2 tablets (40 mg) by mouth 2 (two) times daily                         Examination:   Vitals Temp:  [96.1 F (35.6 C)-98.8 F (37.1 C)]   Heart Rate:  [84-160]   Resp Rate:  [4-36]   BP: (89-138)/(68-89)   SpO2:  [91 %-98 %]   Height:  [190.5 cm ('6\' 3"'$ )]   Weight:  [90.4 kg (199 lb 4.7 oz)-93.5 kg (206 lb 2.1 oz)]   BMI (calculated):  [24.9]  // Temperature with 24 range    Neuro:    Non-focal neuro exam alert & oriented x 3     Lungs:   clear  to auscultation, no wheezes, rales or rhonchi, symmetric air entry     Cardiac:   normal rate, regular rhythm, normal S1, S2, no murmurs, rubs, clicks or gallops     Abdomen:    soft, nontender, nondistended, no masses or organomegaly     Extremities:   peripheral pulses normal    Skin:     Warm, dry and intact Fick/Cardiac Hemodynamics                                 Most Recent Labs  13.49* \ 13.9 / 159    / 44.2 \    03/04 0416    137 105 53.0* / 97   5.6* 18 3.9* \    03/04 [place] ACNP-BC  Nurse Practitioner, [place] or [phone]    Internal Medicine

## 2022-12-21 NOTE — Plan of Care (Signed)
Problem: Pain interferes with ability to perform ADL  Goal: Pain at adequate level as identified by patient  Outcome: Progressing  Flowsheets (Taken 12/21/2022 0306)  Pain at adequate level as identified by patient:   Identify patient comfort function goal   Assess for risk of opioid induced respiratory depression, including snoring/sleep apnea. Alert healthcare team of risk factors identified.   Assess pain on admission, during daily assessment and/or before any "as needed" intervention(s)   Reassess pain within 30-60 minutes of any procedure/intervention, per Pain Assessment, Intervention, Reassessment (AIR) Cycle   Evaluate if patient comfort function goal is met   Evaluate patient's satisfaction with pain management progress   Offer non-pharmacological pain management interventions   Consult/collaborate with Pain Service   Consult/collaborate with Physical Therapy, Occupational Therapy, and/or Speech Therapy   Include patient/patient care companion in decisions related to pain management as needed     Problem: Hemodynamic Status: Cardiac  Goal: Stable vital signs and fluid balance  Outcome: Progressing  Flowsheets (Taken 12/21/2022 0306)  Stable vital signs and fluid balance:   Assess signs and symptoms associated with cardiac rhythm changes   Monitor lab values     Problem: Inadequate Tissue Perfusion  Goal: Adequate tissue perfusion will be maintained  Outcome: Progressing  Flowsheets (Taken 12/21/2022 0306)  Adequate tissue perfusion will be maintained:   Monitor/assess lab values and report abnormal values   Monitor/assess neurovascular status (pulses, capillary refill, pain, paresthesia, paralysis, presence of edema)   Monitor/assess for signs of VTE (edema of calf/thigh redness, pain)   Monitor for signs and symptoms of a pulmonary embolism (dyspnea, tachypnea, tachycardia, confusion)   Encourage/assist patient as needed to turn, cough, and perform deep breathing every 2 hours     Problem: Ineffective Gas  Exchange  Goal: Effective breathing pattern  Outcome: Progressing

## 2022-12-21 NOTE — H&P (Signed)
CRITICAL CARE  [facility]     ADMISSION HISTORY AND PHYSICAL EXAM    Date Time: 12/21/22 12:47 AM  Patient Name: [name] [name]  Attending Physician: Nino Parsley, MD    Assessment:   77 year old male with nonischemic and ischemic cardiomyopathy presenting with worsening renal function and episode of supraventricular tachycardia.  Controlled with adenosine.  Will admit to intensive care unit to continue optimize medical therapy and follow renal function.  Check TSH was dilated cardiomyopathy.  Enlarged heart.  No pulmonary edema.  No pleural effusions.    Plan:   Cardiology and nephrology consultations.  Hold diuretics consider holding diuretics given worsening renal function, and clear chest x-ray, reassess LV function.    History of Present Illness:   [name] [name] is a 78 y.o. male who has nonischemic and ischemic cardiomyopathy and has developed being evaluated for LVAD by the [facility] transplant team.  Other diagnoses are diabetes and COPD.  CLL.  The patient recently has had renal dysfunction.  He presented to the emergency department with tachycardia and dyspnea.  He states he had Nausea and congestion.  No fever.  No sputum production.  He denies increasing weight which is under 95 pounds.  He was found to have a supraventricular tachycardia and had an attempted cardioversion 120 J with no change.  Subsequently was given 6 mg of adenosine when the rhythm slowed and subsequently is normal sinus less than 100.  The patient denies acute complaints.  He is admitted to intensive care unit for further care.  He is followed by [employer] at [place].  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  Has been treated with BTK inhibitors, venetoclax.  History of right lower extremity DVT, on Eliquis.  Previously had hyperkalemia and spironolactone was discontinued.  Past Surgical History:     Past Surgical History:   Procedure Laterality Date    BIV  11/10/2016    ICD  Dale  UPGRADE     CARDIAC CATHETERIZATION  03/2010    CORONARY STENT PLACEMENT X 2 PER PT; LM normal, LAD 70-80% distal lesion at apex, 20% lesion mid CFX    CARDIAC CATHETERIZATION  12/2012    CARDIAC DEFIBRILLATOR PLACEMENT  12/2012    [facility]    CARDIAC PACEMAKER PLACEMENT  2014    MEDTRONIC ICD/PACEMAKER PLACED [place] [facility]    CIRCUMCISION  AGE 80    COLONOSCOPY  2009    CORRECTION HAMMER TOE      duodenal ulcer  1973    STRESS RELATED IN SCHOOL    ECHOCARDIOGRAM, TRANSTHORACIC  02/2016 11/2016 09/2017 12/2017    ECHOCARDIOGRAM, TRANSTHORACIC  02/2016,11/2016,12/20/2017    EGD N/A 08/15/2014    Procedure: EGD;  Surgeon: Loraine Maple, MD;  Location: [facility];  Service: Gastroenterology;  Laterality: N/A;  egd w/ bx    EGD  1975    EGD, COLONOSCOPY N/A 04/04/2018    Procedure: EGD with bxs, COLONOSCOPY with polypectomy and clipping;  Surgeon: Omer Jack, MD;  Location: [facility];  Service: Gastroenterology;  Laterality: N/A;    FRACTURE SURGERY  AGE 49     RT  ANKLE- CAST APPLIED    HERNIA REPAIR  AGE 80    LEFT INGUINAL HERNIA    MPI nuclear study  06/2016    orthopedic surgery  AGE 65    RT foot corrective - HAMMERTOE    RIGHT HEART CATH Right 09/09/2022    Procedure:  Right Heart Cath;  Surgeon: Wandra Arthurs, MD;  Location: [facility];  Service: Cardiovascular;  Laterality: Right;  tx from [place]       Family History:     Family History   Problem Relation Age of Onset    Breast cancer Mother     Heart attack Mother 32    Hypertension Mother     Diabetes Mother     Coronary artery disease Mother     Diabetes Brother     Heart attack Father     Hypertension Father    Chronic systolic heart failure nonischemic cardiomyopathy ventricular ejection fraction 25 to 30% by echo August 2022 21% by echo November 2023.  Medtronic ICD in place.  He is under consideration for LVAD.  He has been on metoprolol 25 mg daily, Isordil 10 mg 3 times  daily, hydralazine 25 mg 3 times daily, and Jardiance 10 mg daily.  He is on anticoagulation with Eliquis.  Previously on Coreg and Entresto.  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[place], history    Tobacco Use    Smoking status: Never    Smokeless tobacco: Never   Vaping Use    Vaping Use: Never used   Substance and Sexual Activity    Alcohol use: Not Currently    Drug use: No    Sexual activity: Yes     Partners: Female   Other Topics Concern    Not on file   Social History Narrative    Not on file     Social Determinants of Health     Financial Resource Strain: Not on file   Food Insecurity: No Food Insecurity (09/12/2022)    Hunger Vital Sign     Worried About Running Out of Food in the Last Year: Never true     Ran Out of Food in the Last Year: Never true   Transportation Needs: No Transportation Needs (09/12/2022)    PRAPARE - Armed forces logistics/support/administrative officer (Medical): No     Lack of Transportation (Non-Medical): No   Physical Activity: Not on file   Stress: Not on file   Social Connections: Not on file   Intimate Partner Violence: Not At Risk (09/12/2022)    Humiliation, Afraid, Rape, and Kick questionnaire     Fear of Current or Ex-Partner: No     Emotionally Abused: No     Physically Abused: No     Sexually Abused: No   Housing Stability: Low Risk  (09/12/2022)    Housing Stability Vital Sign     Unable to Pay for Housing in the Last Year: No     Number of [place] in the Last Year: 1     Unstable Housing in the Last Year: No       Allergies:     Allergies   Allergen Reactions    Allopurinol        Medications:     Medications Prior to Admission   Medication Sig    apixaban (ELIQUIS) 5 MG Take 1 tablet (5  mg) by mouth every 12 (twelve)  hours For 1 week followed by 1 tablet twice daily    aspirin EC 81 MG EC tablet Take 1 tablet (81 mg) by mouth daily    atorvastatin (LIPITOR) 40 MG tablet Take 1 tablet (40 mg total) by mouth nightly    Calquence 100 MG Tab     hydrALAZINE (APRESOLINE) 25 MG tablet Take 1 tablet (25 mg) by mouth every 8 (eight) hours    Insulin Glargine-yfgn (Semglee, yfgn,) 100 UNIT/ML Solution Pen-injector 50 Units by Subdermal route daily    isosorbide dinitrate (ISORDIL) 10 MG tablet Take 1 tablet (10 mg) by mouth 3 (three) times daily    Jardiance 10 MG tablet TAKE 1 TABLET(10 MG) BY MOUTH DAILY    loratadine (CLARITIN) 10 MG tablet Take 1 tablet (10 mg) by mouth daily    metoprolol succinate XL (TOPROL-XL) 25 MG 24 hr tablet Take 0.5 tablets (12.5 mg) by mouth daily    niacin (NIASPAN) 500 MG CR tablet TAKE 1 TABLET BY MOUTH TWICE DAILY (Patient taking differently: 250 mg Taking half a tablet)    torsemide (DEMADEX) 20 MG tablet Take 2 tablets (40 mg) by mouth 2 (two) times daily     Scheduled Meds:  Current Facility-Administered Medications   Medication Dose Route Frequency    adenosine  12 mg Intravenous Once     Continuous Infusions:  PRN Meds:.    Review of Systems:   A comprehensive review of systems was: ENT ROS: negative for - epistaxis, headaches, nasal congestion, nasal polyps, oral lesions, sore throat, vertigo, visual changes or vocal changes  Hematological and Lymphatic ROS: negative for - bleeding problems, blood clots, jaundice, swollen lymph nodes or weight loss  Respiratory ROS: Mild upper airway congestion  Cardiovascular ROS: Palpitations.  Resolved.  No chest pain gastrointestinal ROS: negative for - abdominal pain, appetite loss, blood in stools, change in bowel habits, diarrhea, nausea/vomiting or swallowing difficulty/pain  Genito-Urinary ROS: negative for - dysuria, hematuria or urinary frequency/urgency.  No edema  Musculoskeletal ROS: negative for - joint pain, joint swelling or muscular  weakness  Neurological ROS: negative for - confusion, gait disturbance, seizures or weakness  Dermatological ROS: negative for rash and skin lesion changes    Physical Exam:     Vitals:    12/21/22 0030   BP: 106/82   Pulse: 93   Resp: (!) 10   Temp:    SpO2: 96%       Intake and Output Summary (Last 24 hours) at Date Time  No intake or output data in the 24 hours ending 12/21/22 0047    General appearance thin,- alert,  and in no acute distress and oriented to person, place, and time  Mental status - alert, oriented to person, place, and time  Eyes - pupils equal and reactive, extraocular eye movements intact, sclera anicteric  Ears - bilateral TM's and external ear canals normal  Nose - normal and patent, no erythema, discharge or polyps  Mouth - mucous membranes moist, pharynx normal without lesions  Neck - supple, no significant adenopathy, no  Jugular venous distension  Lymphatics - no palpable lymphadenopathy, no hepatosplenomegaly  Chest - clear to auscultation, no wheezes, rales or rhonchi, symmetric air entry  Heart - normal rate, regular rhythm, normal S1, S2, no murmurs, rubs, clicks or gallops  Abdomen - soft, nontender, nondistended, no masses or organomegaly  Rectal - deferred, not clinically indicated  Back exam - full range of motion,  no tenderness, palpable spasm or pain on motion  Neurological - alert, oriented, normal speech, no focal findings or movement disorder noted  Musculoskeletal - no joint tenderness, deformity or swelling  Extremities - peripheral pulses normal, no pedal edema, no clubbing or cyanosis  Skin - normal coloration and turgor, no rashes, no suspicious skin lesions noted    Labs:   Recent CBC   Recent Labs   Lab 12/20/22  2034   WBC 12.60*   RBC 4.41   Hgb 14.6   Hematocrit 45.2   MCV 102.5*   Platelets 161       Recent Labs   Lab 12/20/22  2222 12/20/22  2038 12/20/22  2034   Sodium  --   --  142   Potassium  --   --  4.5   Chloride  --   --  106   CO2  --   --  17   Glucose   --   --  104*   BUN  --   --  48.0*   Creatinine  --   --  3.0*   Creatinine I-Stat  --  3.2*  --    Magnesium  --   --  2.9*   AST (SGOT)  --   --  38   ALT  --   --  40   Alkaline Phosphatase  --   --  115   NT-proBNP  --   --  26,524*   hs Troponin-I 183.0*  --  192.4*   hs Troponin-I Delta -9.4  --   --    EKG ventricular paced rhythm rate of 100    Rads:     Radiology Results (24 Hour)       Procedure Component Value Units Date/Time    Chest AP Portable [id number] Collected: 12/20/22 2111    Order Status: Completed Updated: 12/20/22 2115    Narrative:      HISTORY: Chest pain     COMPARISON: 09/04/2022    FINDINGS:   Cardiomegaly with left-sided pacer device in place. No definite pulmonary  edema. No focal consolidation, pleural effusion or pneumothorax.      Impression:        No acute intrathoracic process.    Montine Circle, MD  12/20/2022 9:12 PM            I have personally reviewed the patient's history and 24 hour interval events, along with vitals, labs, radiology images and  02 settings and additional findings found in detail within ICU team notes, with their care plans developed with and reviewed by me.    Time spent in patient evaluation and treatment in critical care excluding procedures, and not overlapping any other providers: 55   minutes.    Signed by: Nino Parsley, MD, MD

## 2022-12-21 NOTE — Consults (Addendum)
[place]  [place]  [place] [id number]  Loc: [id number]      Date Time: 12/21/22 8:22 AM  Patient Name: [name] [name]  Requesting Physician: Nino Parsley, MD    Reason for Consultation:   SVT, CHF    History of Present Illness:   [name] [name] is a 77 y.o. male admitted on 12/20/2022.  We have been asked by Nino Parsley, MD to provide cardiac consultation regarding SVT and CHF.    Patient has a history of cardiomyopathy and is being evaluated by advanced heart failure for advanced therapies including LVAD.  He presented to the ER with shortness of breath and tachycardia.  He reported feeling a burning sensation in his chest associated with some shortness of breath. He was found to be in SVT and had an unsuccessful cardioversion with 120 J.  He was given adenosine and eventually converted to sinus rhythm.  Telemetry currently shows atrial sensed ventricular paced rhythm.    Chest x-ray with no acute transthoracic process.  Labs with troponin 192, 183, proBNP 3 26,000, creatinine 3.3, potassium 5.8, WBC 13.5.    Initial EKG shows regular narrow complex tachycardia, SVT, rate 158, short RP tachycardia.  Follow-up EKG shows atrial sensed ventricular paced rhythm.    Cardiology relevant medical records in Epic chart reviewed.    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    Patient  has a past surgical history that includes duodenal ulcer (1973); orthopedic surgery (AGE 34); EGD (N/A, 08/15/2014); Circumcision (AGE 32); Fracture surgery (AGE 10); Hernia repair (AGE 32); Colonoscopy (2009); Tonsillectomy and adenoidectomy (1954); Cardiac defibrillator placement (12/2012); Cardiac pacemaker placement (2014); EGD (1975); EGD, COLONOSCOPY (N/A, 04/04/2018); ECHOCARDIOGRAM, TRANSTHORACIC (02/2016 11/2016 09/2017 12/2017); MPI nuclear study (06/2016); Correction hammer toe; BIV (11/10/2016); ECHOCARDIOGRAM, TRANSTHORACIC (02/2016,11/2016,12/20/2017); Cardiac catheterization (03/2010); Cardiac catheterization (12/2012); and Right Heart Cath (Right, 09/09/2022).    Family History:   Patient's family history includes Breast cancer in his mother; Coronary artery disease in his mother; Diabetes in his brother and mother; Heart attack in his father; Heart attack (age of onset: 5) in his mother; Hypertension in his father and mother.    Social History:   Patient  reports that he has never smoked. He has never used smokeless tobacco. He reports that he does not currently use alcohol. He reports that he does not use drugs.    Allergies:     Allergies   Allergen Reactions    Allopurinol        Medications:     Current Outpatient Medications   Medication Instructions    apixaban (ELIQUIS) 5 mg, Oral, Every 12 hours, For 1 week followed by 1 tablet twice daily    aspirin EC 81 mg, Oral, Daily    atorvastatin (LIPITOR) 40 mg, Oral, At bedtime    Calquence 100 MG Tab No dose, route, or frequency recorded.  hydrALAZINE (APRESOLINE) 25 mg, Oral, Every 8 hours scheduled    Insulin Glargine-yfgn (SEMGLEE (YFGN)) 50 Units,  Subdermal, Daily    isosorbide dinitrate (ISORDIL) 10 mg, Oral, 3 times daily with nitrate free interval    Jardiance 10 mg, Oral, Daily    loratadine (CLARITIN) 10 mg, Oral, Daily    metoprolol succinate XL (TOPROL-XL) 12.5 mg, Oral, Daily    niacin (NIASPAN) 500 MG CR tablet TAKE 1 TABLET BY MOUTH TWICE DAILY    torsemide (DEMADEX) 40 mg, Oral, 2 times daily        Scheduled Meds: PRN Meds:    adenosine, 12 mg, Intravenous, Once  apixaban, 2.5 mg, Oral, Q12H  aspirin EC, 81 mg, Oral, Daily  atorvastatin, 40 mg, Oral, QHS  empagliflozin, 10 mg, Oral, Daily  hydrALAZINE, 25 mg, Oral, Q8H SCH  isosorbide dinitrate, 10 mg, Oral, TID - Nitrate Free Interval  metoprolol succinate XL, 12.5 mg, Oral, Daily  niacin, 500 mg, Oral, BID  senna-docusate, 1 tablet, Oral, Q12H SCH        Continuous Infusions:   dextrose 5 % 1,000 mL with sodium bicarbonate 100 mEq infusion 60 mL/hr (12/21/22 0646)             Physical Exam:     Vitals:    12/21/22 0400 12/21/22 0500 12/21/22 0600 12/21/22 0820   BP: 100/83 101/80 101/82 113/86   Pulse: 85 87 88    Resp:  (!) 11     Temp: (!) 96.6 F (35.9 C)  (!) 96.7 F (35.9 C)    TempSrc: Temporal  Temporal    SpO2: 96% 97% 95%    Weight:       Height:           Constitutional: Cooperative, alert, no acute distress.  Neck: No carotid bruits, JVP normal.  Cardiac: Regular rate and rhythm, normal S1 and S2; no S3 or S4, no murmurs, no rubs, no gallops.  Pulmonary: Diminished breath sounds bilaterally  Extremities: 1+ lower extremity edema.  Vascular: +2 pulses in radial artery bilaterally, 2+ pedal pulses bilaterally.    Lab/Test Findings:     Recent Labs   Lab 12/20/22  2222 12/20/22  2034   hs Troponin-I 183.0* 192.4*   hs Troponin-I Delta -9.4  --      Recent Labs   Lab 12/20/22  2034   NT-proBNP 26,524*         Recent Labs   Lab 12/21/22  0416 12/20/22  2038 12/20/22  2034   Sodium 141  --  142   Potassium 5.8*  --  4.5   Chloride 108  --  106   CO2 17  --  17   BUN 51.0*  --  48.0*    Creatinine 3.3*  --  3.0*   Creatinine I-Stat  --  3.2*  --    eGFR 18.6* 19.1* 20.9*   Glucose 75  --  104*   Calcium 9.6  --  10.1     Recent Labs   Lab 12/21/22  0416 12/20/22  2034   WBC 13.49* 12.60*   Hgb 13.9 14.6   Hematocrit 44.2 45.2   Platelets 159 161             Recent Labs   Lab 12/21/22  0416   Bilirubin, Total 3.6*   Protein, Total 6.7   Albumin 3.7   ALT 73*   AST (SGOT) 73*     Recent Labs  Lab 12/21/22  0416   Magnesium 3.2*         Recent Labs   Lab 12/21/22  0416   TSH 2.18       Lab Results   Component Value Date    BNP 26,524 (H) 12/20/2022      Estimated Creatinine Clearance: 23.1 mL/min (A) (based on SCr of 3.3 mg/dL (H)).        09/14/2022     4:20 AM 09/18/2022    10:19 AM 10/22/2022     9:48 AM 10/26/2022     1:40 PM 11/23/2022     1:22 PM 12/20/2022     8:32 PM 12/21/2022    12:30 AM   Weight Monitoring   Height  182.9 cm 182.9 cm  182.9 cm  190.5 cm   Height Method       Stated   Weight 84.369 kg 86.183 kg 83.915 kg 92.443 kg 91.354 kg 93.5 kg 90.4 kg   Weight Method       Bed Scale   BMI (calculated)  25.8 kg/m2 25.1 kg/m2  27.4 kg/m2  24.9 kg/m2         ASSESSMENT:   Patient is a 77 y.o. 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  Unable to uptitrate beta-blockers given his significant cardiomyopathy  Sent message to advanced heart failure team Dr. Judeth Horn  Appreciate nephrology recs    Addendum  Labs show worsening perfusion with Cr 3.3 -> 3.9, lactate elevated to 2.9.  Patient oliguric.  Would recommend initiation of inotropes with dobutamine.  Messaged advanced heart failure, Dr. Judeth Horn, recommends initiating transfer to [facility] for continued inpatient advanced heart failure therapies evaluation.    Recommendations discussed with primary medical team.  -------------------------------------------------------------------------------------  Signed by:         Gaspar Bidding, MD         [place] (Group):   [facility]    APP [place]:  [phone]    MD [place] :  [phone]  [phone]    After hours, non urgent consult line:  [phone]    After hours, physician on-call:  [place] (Group):   [facility]    APP [place]:  [phone]    MD [place] :  [phone]      After hours, non urgent consult line:  [phone]    After hours, physician on-call:  [phone] [place] (Group):   [facility]    APP [place]:  [phone]    MD [place] :  [phone]      After hours, non urgent consult line:  [phone]    After hours, physician on-call:  [place] (Group):   [facility]    APP [place]:  [phone]    MD [place] :  [phone]      After hours, non urgent consult line:  [phone]    After hours, physician on-call:  [phone]       This note was generated by the Dragon speech recognition and may contain errors or omissions not intended by the user. Grammatical  errors, random word insertions, deletions, pronoun errors, and incomplete sentences are occasional consequences of this technology due to software limitations. Not all errors are caught or corrected. If there are questions or concerns about the content of this note or information contained within the body of this dictation, they should be addressed directly with the author for clarification.

## 2022-12-21 NOTE — Plan of Care (Signed)
Problem: Ineffective Gas Exchange  Goal: Effective breathing pattern  Outcome: Progressing  Flowsheets (Taken 12/21/2022 1008)  Effective breathing pattern:   Monitor end tidal CO2 level per LIP order   Teach/reinforce use of ordered respiratory interventions (ie. CPAP, BiPAP, Incentive Spirometer, Acapella)     Problem: Moderate/High Fall Risk Score >5  Goal: Patient will remain free of falls  Outcome: Progressing  Flowsheets (Taken 12/21/2022 1008)  VH High Risk (Greater than 13):   Use assistive devices   Request PT/OT therapy consult order from physician for patients with gait/mobility impairment   Keep door open for better visibility   Use chair-pad alarm device     Problem: Compromised Activity/Mobility  Goal: Activity/Mobility Interventions  Outcome: Progressing  Flowsheets (Taken 12/21/2022 1008)  Activity/Mobility Interventions: Pad bony prominences, TAP Seated positioning system when OOB, Promote PMP, Reposition q 2 hrs / turn clock, Offload heels

## 2022-12-21 NOTE — Significant Event (Signed)
Note completed in paper chart.  See previous note.

## 2022-12-22 ENCOUNTER — Ambulatory Visit: Admission: RE | Admit: 2022-12-22 | Payer: Self-pay | Source: Ambulatory Visit | Admitting: Cardiovascular Disease

## 2022-12-22 ENCOUNTER — Encounter
Admission: AD | Disposition: A | Discharge status: Home Health | Payer: Self-pay | Source: Other Acute Inpatient Hospital | Attending: Critical Care Medicine

## 2022-12-22 ENCOUNTER — Inpatient Hospital Stay: Payer: Commercial Managed Care - POS

## 2022-12-22 DIAGNOSIS — I255 Ischemic cardiomyopathy: Secondary | ICD-10-CM

## 2022-12-22 DIAGNOSIS — I509 Heart failure, unspecified: Principal | ICD-10-CM | POA: Diagnosis present

## 2022-12-22 HISTORY — PX: RIGHT HEART CATH ONLY INCL. CO AND SATS: CATH20

## 2022-12-22 LAB — COMPREHENSIVE METABOLIC PANEL
ALT: 409 U/L — ABNORMAL HIGH (ref 0–55)
ALT: 374 U/L — ABNORMAL HIGH (ref 0–55)
ALT: 342 U/L — ABNORMAL HIGH (ref 0–55)
AST (SGOT): 322 U/L — ABNORMAL HIGH (ref 5–41)
AST (SGOT): 205 U/L — ABNORMAL HIGH (ref 5–41)
AST (SGOT): 165 U/L — ABNORMAL HIGH (ref 5–41)
Albumin/Globulin Ratio: 1.1 (ref 0.9–2.2)
Albumin/Globulin Ratio: 1.1 (ref 0.9–2.2)
Albumin/Globulin Ratio: 1 (ref 0.9–2.2)
Albumin: 3.1 g/dL — ABNORMAL LOW (ref 3.5–5.0)
Albumin: 3.1 g/dL — ABNORMAL LOW (ref 3.5–5.0)
Albumin: 2.9 g/dL — ABNORMAL LOW (ref 3.5–5.0)
Alkaline Phosphatase: 83 U/L (ref 37–117)
Alkaline Phosphatase: 82 U/L (ref 37–117)
Alkaline Phosphatase: 81 U/L (ref 37–117)
Anion Gap: 12 (ref 5.0–15.0)
Anion Gap: 13 (ref 5.0–15.0)
Anion Gap: 10 (ref 5.0–15.0)
BUN: 62 mg/dL — ABNORMAL HIGH (ref 9.0–28.0)
BUN: 59 mg/dL — ABNORMAL HIGH (ref 9.0–28.0)
BUN: 61 mg/dL — ABNORMAL HIGH (ref 9.0–28.0)
Bilirubin, Total: 1.5 mg/dL — ABNORMAL HIGH (ref 0.2–1.2)
Bilirubin, Total: 1.8 mg/dL — ABNORMAL HIGH (ref 0.2–1.2)
Bilirubin, Total: 1.5 mg/dL — ABNORMAL HIGH (ref 0.2–1.2)
CO2: 19 mEq/L (ref 17–29)
CO2: 23 mEq/L (ref 17–29)
CO2: 25 mEq/L (ref 17–29)
Calcium: 8.4 mg/dL (ref 7.9–10.2)
Calcium: 8.8 mg/dL (ref 7.9–10.2)
Calcium: 8.2 mg/dL (ref 7.9–10.2)
Chloride: 101 mEq/L (ref 99–111)
Chloride: 101 mEq/L (ref 99–111)
Chloride: 99 mEq/L (ref 99–111)
Creatinine: 4 mg/dL — ABNORMAL HIGH (ref 0.5–1.5)
Creatinine: 3.4 mg/dL — ABNORMAL HIGH (ref 0.5–1.5)
Creatinine: 3.3 mg/dL — ABNORMAL HIGH (ref 0.5–1.5)
Globulin: 2.9 g/dL (ref 2.0–3.6)
Globulin: 2.9 g/dL (ref 2.0–3.6)
Globulin: 2.9 g/dL (ref 2.0–3.6)
Glucose: 276 mg/dL — ABNORMAL HIGH (ref 70–100)
Glucose: 168 mg/dL — ABNORMAL HIGH (ref 70–100)
Glucose: 270 mg/dL — ABNORMAL HIGH (ref 70–100)
Potassium: 4.9 mEq/L (ref 3.5–5.3)
Potassium: 3.8 mEq/L (ref 3.5–5.3)
Potassium: 4.1 mEq/L (ref 3.5–5.3)
Protein, Total: 6 g/dL (ref 6.0–8.3)
Protein, Total: 6 g/dL (ref 6.0–8.3)
Protein, Total: 5.8 g/dL — ABNORMAL LOW (ref 6.0–8.3)
Sodium: 132 mEq/L — ABNORMAL LOW (ref 135–145)
Sodium: 137 mEq/L (ref 135–145)
Sodium: 134 mEq/L — ABNORMAL LOW (ref 135–145)
eGFR: 14.8 mL/min/{1.73_m2} — AB (ref >=60)
eGFR: 18 mL/min/{1.73_m2} — AB (ref >=60)
eGFR: 18.6 mL/min/{1.73_m2} — AB (ref >=60)

## 2022-12-22 LAB — PROCALCITONIN: Procalcitonin: 1.13 ng/ml — ABNORMAL HIGH (ref 0.00–0.10)

## 2022-12-22 LAB — PT AND APTT
PT INR: 2.5 — ABNORMAL HIGH (ref 0.9–1.1)
PT: 29 s — ABNORMAL HIGH (ref 10.1–12.9)
PTT: 32 s (ref 27–39)

## 2022-12-22 LAB — APTT
PTT: 33 s (ref 27–39)
PTT: 125 s — ABNORMAL HIGH (ref 27–39)
PTT: 70 s — ABNORMAL HIGH (ref 27–39)

## 2022-12-22 LAB — COOXIMETRY PROFILE
Carboxyhemoglobin: 0.8 % (ref 0.0–3.0)
Carboxyhemoglobin: 1.4 % (ref 0.0–3.0)
Hematocrit Total Calculated: 37.4 % — ABNORMAL LOW (ref 40.0–54.0)
Hematocrit Total Calculated: 35.7 % — ABNORMAL LOW (ref 40.0–54.0)
Hemoglobin Total: 12.2 g/dL — ABNORMAL LOW (ref 13.0–17.0)
Hemoglobin Total: 11.6 g/dL — ABNORMAL LOW (ref 13.0–17.0)
Methemoglobin: 0.8 % (ref 0.0–3.0)
Methemoglobin: 1.1 % (ref 0.0–3.0)
O2 Content: 12 Vol %
O2 Content: 8.5 Vol %
Oxygenated Hemoglobin: 70.4 % — ABNORMAL LOW (ref 85.0–98.0)
Oxygenated Hemoglobin: 52 % — ABNORMAL LOW (ref 85.0–98.0)

## 2022-12-22 LAB — TYPE AND SCREEN
AB Screen Gel: NEGATIVE
ABO Rh: O POS

## 2022-12-22 LAB — ANTI-XA,UFH: Anti-Xa, UFH: 1.21 IU/mL

## 2022-12-22 LAB — WHOLE BLOOD GLUCOSE POCT
Whole Blood Glucose POCT: 260 mg/dL — ABNORMAL HIGH (ref 70–100)
Whole Blood Glucose POCT: 205 mg/dL — ABNORMAL HIGH (ref 70–100)
Whole Blood Glucose POCT: 176 mg/dL — ABNORMAL HIGH (ref 70–100)
Whole Blood Glucose POCT: 153 mg/dL — ABNORMAL HIGH (ref 70–100)
Whole Blood Glucose POCT: 167 mg/dL — ABNORMAL HIGH (ref 70–100)
Whole Blood Glucose POCT: 234 mg/dL — ABNORMAL HIGH (ref 70–100)

## 2022-12-22 LAB — ECG 12-LEAD
Atrial Rate: 87 {beats}/min
P Axis: 54 degrees
P-R Interval: 148 ms
Q-T Interval: 322 ms
Q-T Interval: 414 ms
QRS Duration: 102 ms
QRS Duration: 98 ms
QTC Calculation (Bezet): 522 ms
QTC Calculation (Bezet): 498 ms
R Axis: -32 degrees
R Axis: 33 degrees
T Axis: 126 degrees
T Axis: 220 degrees
Ventricular Rate: 158 {beats}/min
Ventricular Rate: 87 {beats}/min

## 2022-12-22 LAB — HEMOLYSIS INDEX(SOFT): Hemolysis Index: 24 Index (ref 0–24)

## 2022-12-22 LAB — MAGNESIUM
Magnesium: 2.8 mg/dL — ABNORMAL HIGH (ref 1.6–2.6)
Magnesium: 2.6 mg/dL (ref 1.6–2.6)

## 2022-12-22 LAB — HIGH SENSITIVITY TROPONIN-I
hs Troponin-I: 192.3 ng/L — CR
hs Troponin-I: 154.6 ng/L — CR

## 2022-12-22 LAB — NT-PROBNP: NT-proBNP: 15327 pg/mL — ABNORMAL HIGH (ref 0–450)

## 2022-12-22 LAB — LIPID PANEL
Cholesterol / HDL Ratio: 3.8 Index
Cholesterol: 106 mg/dL (ref 0–199)
HDL: 28 mg/dL — ABNORMAL LOW (ref 40–9999)
LDL Calculated: 63 mg/dL (ref 0–99)
Triglycerides: 76 mg/dL (ref 34–149)
VLDL Calculated: 15 mg/dL (ref 10–40)

## 2022-12-22 LAB — IRON PROFILE
Iron Saturation: 10 % — ABNORMAL LOW (ref 15–50)
Iron: 32 ug/dL — ABNORMAL LOW (ref 41–168)
TIBC: 307 ug/dL (ref 261–462)
UIBC: 275 ug/dL (ref 126–382)

## 2022-12-22 LAB — LACTIC ACID
Lactic Acid: 2.1 mmol/L — ABNORMAL HIGH (ref 0.2–2.0)
Lactic Acid: 1 mmol/L (ref 0.2–2.0)
Lactic Acid: 1.3 mmol/L (ref 0.2–2.0)

## 2022-12-22 LAB — FERRITIN: Ferritin: 57 ng/mL (ref 21.80–274.70)

## 2022-12-22 LAB — HEMOGLOBIN A1C
Average Estimated Glucose: 174.3 mg/dL
Hemoglobin A1C: 7.7 % — ABNORMAL HIGH (ref 4.6–5.6)

## 2022-12-22 SURGERY — RIGHT HEART CATH ONLY INCL. CO AND SATS
Anesthesia: Conscious Sedation | Laterality: Right

## 2022-12-22 MED ORDER — LIDOCAINE HCL 1 % IJ SOLN
INTRAMUSCULAR | Status: AC
Start: 2022-12-22 — End: ?
  Filled 2022-12-22: qty 10

## 2022-12-22 MED ORDER — DOBUTAMINE-DEXTROSE 2-5 MG/ML-% IV SOLN
2.5000 ug/kg/min | INTRAVENOUS | Status: DC
Start: 2022-12-22 — End: 2022-12-30
  Administered 2022-12-22 – 2022-12-25 (×6): 5 ug/kg/min via INTRAVENOUS
  Administered 2022-12-26: 7.5 ug/kg/min via INTRAVENOUS
  Administered 2022-12-26: 5 ug/kg/min via INTRAVENOUS
  Administered 2022-12-27: 10 ug/kg/min via INTRAVENOUS
  Administered 2022-12-27: 7.5 ug/kg/min via INTRAVENOUS
  Administered 2022-12-28: 5 ug/kg/min via INTRAVENOUS
  Administered 2022-12-28: 7.5 ug/kg/min via INTRAVENOUS
  Administered 2022-12-29: 2.5 ug/kg/min via INTRAVENOUS
  Filled 2022-12-22 (×14): qty 250

## 2022-12-22 MED ORDER — LIDOCAINE HCL 1 % IJ SOLN
INTRAMUSCULAR | Status: AC | PRN
Start: 2022-12-22 — End: 2022-12-22
  Administered 2022-12-22: 2 mL via SUBCUTANEOUS

## 2022-12-22 MED ORDER — MIDAZOLAM HCL 1 MG/ML IJ SOLN (WRAP)
INTRAMUSCULAR | Status: AC
Start: 2022-12-22 — End: ?
  Filled 2022-12-22: qty 2

## 2022-12-22 MED ORDER — AMIODARONE HCL IN DEXTROSE 360-4.14 MG/200ML-% IV SOLN
1.0000 mg/min | INTRAVENOUS | Status: DC
Start: 2022-12-22 — End: 2022-12-23
  Administered 2022-12-22 – 2022-12-23 (×2): 1 mg/min via INTRAVENOUS
  Filled 2022-12-22: qty 200

## 2022-12-22 MED ORDER — ASPIRIN 81 MG PO TBEC
81.0000 mg | DELAYED_RELEASE_TABLET | Freq: Every day | ORAL | Status: DC
Start: 2022-12-22 — End: 2022-12-22

## 2022-12-22 MED ORDER — HEPARIN (PORCINE) IN NACL 2-0.9 UNIT/ML-% IJ SOLN (WRAP)
INTRAVENOUS | Status: AC
Start: 2022-12-22 — End: ?
  Filled 2022-12-22: qty 500

## 2022-12-22 MED ORDER — AMIODARONE HCL IN DEXTROSE 150-4.21 MG/100ML-% IV SOLN
150.0000 mg | Freq: Once | INTRAVENOUS | Status: AC
Start: 2022-12-22 — End: 2022-12-22
  Administered 2022-12-22: 150 mg via INTRAVENOUS
  Filled 2022-12-22: qty 100

## 2022-12-22 MED ORDER — FUROSEMIDE 10 MG/ML IJ SOLN
160.0000 mg | Freq: Once | INTRAMUSCULAR | Status: AC
Start: 2022-12-22 — End: 2022-12-22
  Administered 2022-12-22: 160 mg via INTRAVENOUS
  Filled 2022-12-22: qty 16

## 2022-12-22 MED ORDER — SODIUM CHLORIDE 0.9 % IV MBP
4.5000 g | Freq: Three times a day (TID) | INTRAVENOUS | Status: DC
Start: 2022-12-22 — End: 2022-12-24
  Administered 2022-12-22 – 2022-12-24 (×5): 4.5 g via INTRAVENOUS
  Filled 2022-12-22 (×5): qty 20

## 2022-12-22 MED ORDER — DEXTROSE 10 % IV BOLUS
12.5000 g | INTRAVENOUS | Status: DC | PRN
Start: 2022-12-22 — End: 2022-12-24

## 2022-12-22 MED ORDER — AMIODARONE HCL IN DEXTROSE 360-4.14 MG/200ML-% IV SOLN
0.5000 mg/min | INTRAVENOUS | Status: DC
Start: 2022-12-23 — End: 2022-12-24
  Administered 2022-12-23 – 2022-12-24 (×4): 0.5 mg/min via INTRAVENOUS
  Filled 2022-12-22 (×4): qty 200

## 2022-12-22 MED ORDER — POTASSIUM CHLORIDE CRYS ER 20 MEQ PO TBCR
20.0000 meq | EXTENDED_RELEASE_TABLET | Freq: Once | ORAL | Status: AC
Start: 2022-12-22 — End: 2022-12-22
  Administered 2022-12-22: 20 meq via ORAL
  Filled 2022-12-22: qty 1

## 2022-12-22 MED ORDER — INSULIN GLARGINE 100 UNIT/ML SC SOLN
5.0000 [IU] | Freq: Every morning | SUBCUTANEOUS | Status: DC
Start: 2022-12-22 — End: 2022-12-24
  Administered 2022-12-23: 5 [IU] via SUBCUTANEOUS
  Filled 2022-12-22: qty 5

## 2022-12-22 MED ORDER — VANCOMYCIN PHARMACY TO DOSE PLACEHOLDER
INTRAVENOUS | Status: DC
Start: 2022-12-22 — End: 2022-12-24

## 2022-12-22 MED ORDER — GLUCOSE 40 % PO GEL (WRAP)
15.0000 g | ORAL | Status: DC | PRN
Start: 2022-12-22 — End: 2022-12-24

## 2022-12-22 MED ORDER — BUMETANIDE 0.25 MG/ML IJ SOLN
2.0000 mg | Freq: Once | INTRAMUSCULAR | Status: AC
Start: 2022-12-22 — End: 2022-12-22
  Administered 2022-12-22: 2 mg via INTRAVENOUS
  Filled 2022-12-22: qty 8

## 2022-12-22 MED ORDER — FENTANYL CITRATE (PF) 50 MCG/ML IJ SOLN (WRAP)
INTRAMUSCULAR | Status: AC
Start: 2022-12-22 — End: ?
  Filled 2022-12-22: qty 2

## 2022-12-22 MED ORDER — HEPARIN (PORCINE) IN NACL 1000-0.9 UT/500ML-% IV SOLN - TABLE FLUSH (SEDATION NARRATOR)
INTRAVENOUS | Status: AC | PRN
Start: 2022-12-22 — End: 2022-12-22
  Administered 2022-12-22: 1000 [IU]

## 2022-12-22 MED ORDER — VANCOMYCIN HCL IN NACL 1.75-0.9 GM/500ML-% IV SOLN
20.0000 mg/kg | Freq: Once | INTRAVENOUS | Status: AC
Start: 2022-12-22 — End: 2022-12-23
  Administered 2022-12-23: 1750 mg via INTRAVENOUS
  Filled 2022-12-22: qty 500

## 2022-12-22 MED ORDER — ACETAMINOPHEN 325 MG PO TABS
650.0000 mg | ORAL_TABLET | Freq: Four times a day (QID) | ORAL | Status: DC | PRN
Start: 2022-12-22 — End: 2023-01-04
  Administered 2022-12-27 – 2023-01-03 (×4): 650 mg via ORAL
  Filled 2022-12-22 (×5): qty 2

## 2022-12-22 MED ORDER — DEXTROSE 50 % IV SOLN
12.5000 g | INTRAVENOUS | Status: DC | PRN
Start: 2022-12-22 — End: 2022-12-24

## 2022-12-22 MED ORDER — FENTANYL CITRATE (PF) 50 MCG/ML IJ SOLN (WRAP)
INTRAMUSCULAR | Status: AC | PRN
Start: 2022-12-22 — End: 2022-12-22
  Administered 2022-12-22: 25 ug via INTRAVENOUS

## 2022-12-22 MED ORDER — MIDAZOLAM HCL 1 MG/ML IJ SOLN (WRAP)
INTRAMUSCULAR | Status: AC | PRN
Start: 2022-12-22 — End: 2022-12-22
  Administered 2022-12-22: 1 mg via INTRAVENOUS

## 2022-12-22 MED ORDER — GLUCAGON 1 MG IJ SOLR (WRAP)
1.0000 mg | INTRAMUSCULAR | Status: DC | PRN
Start: 2022-12-22 — End: 2022-12-24

## 2022-12-22 MED ORDER — INSULIN LISPRO 100 UNIT/ML SOLN (WRAP)
1.0000 [IU] | Status: DC
Start: 2022-12-22 — End: 2022-12-23
  Administered 2022-12-22: 1 [IU] via SUBCUTANEOUS
  Administered 2022-12-22: 5 [IU] via SUBCUTANEOUS
  Administered 2022-12-22 – 2022-12-23 (×4): 3 [IU] via SUBCUTANEOUS
  Administered 2022-12-23: 1 [IU] via SUBCUTANEOUS
  Administered 2022-12-23: 5 [IU] via SUBCUTANEOUS
  Filled 2022-12-22: qty 9
  Filled 2022-12-22: qty 6
  Filled 2022-12-22: qty 3
  Filled 2022-12-22: qty 9
  Filled 2022-12-22 (×2): qty 15
  Filled 2022-12-22: qty 3
  Filled 2022-12-22: qty 9

## 2022-12-22 MED ORDER — PHENOL 1.4 % MT LIQD
1.0000 | OROMUCOSAL | Status: DC | PRN
Start: 2022-12-22 — End: 2023-01-04
  Filled 2022-12-22: qty 20

## 2022-12-22 MED ORDER — INSULIN GLARGINE 100 UNIT/ML SC SOLN
10.0000 [IU] | Freq: Every morning | SUBCUTANEOUS | Status: DC
Start: 2022-12-22 — End: 2022-12-24
  Administered 2022-12-22 – 2022-12-24 (×2): 10 [IU] via SUBCUTANEOUS
  Filled 2022-12-22 (×2): qty 10

## 2022-12-22 SURGICAL SUPPLY — 9 items
CATHETER PA .032IN STD SG VIP 7.5FR 110 (Catheter) ×1
CATHETER PULMONARY ARTERY L110 CM STANDARD 5 LUMEN THERMODILUTION (Catheter) ×1 IMPLANT
GUIDEWIRE VASCULAR OD.025 IN L260 CM (Guidwire) ×1
GUIDEWIRE VASCULAR OD.025 IN L260 CM FIELDER FC 3 MM J (Guidwire) ×1 IMPLANT
INTRODUCER SHEATH OD8 FR L12 CM DILATOR (Introducer) ×1 IMPLANT
KIT MICROINTRODUCER L7 CM MAX COAXIAL (Introducer) ×1
KIT MICROINTRODUCER L7 CM MAX COAXIAL STIFFEN GUIDEWIRE ECHOGENIC (Introducer) ×1 IMPLANT
SHEATH INTRODUCER .035 IN L10 CM L2.5 CM ID6 FR SNAP ON DILATOR LOCK (Sheaths) ×1 IMPLANT
SHEATH INTRODUCER L10 CM .035 IN SNAP ON (Sheaths) ×1

## 2022-12-22 NOTE — Progress Notes (Signed)
12/22/22 1450   Patient Type   Within 30 Days of Previous Admission? No   Healthcare Decisions   Interviewed: Family   Name of interviewee if other than the pt: Alto, Mesmer (Spouse)  [phone] (Mobile)   Interviewee Contact Information: Shimon, Kanitz (Spouse)  [phone] (Mobile)   Orientation/Decision Making Abilities of Patient Alert and Oriented x3, able to make decisions   Advance Directive Patient does not have advance directive   Prior to admission   Prior level of function Independent with ADLs;Ambulates independently   Type of Residence Private residence   [place] to live on main level with bedroom/bathroom   Have running water, electricity, heat, etc? Yes   Living Arrangements Spouse/significant other   How do you get to your MD appointments? self/spouse   How do you get your groceries? self/spouse   Who fixes your meals? self/spouse   Who does your laundry? self/spouse   Who picks up your prescriptions? self/spouse   Dressing Independent   Grooming Independent   Feeding Independent   Bathing Independent   Toileting Independent   DME Currently at [facility] None   Prior SNF admission? (Detail) none   Prior Rehab admission? (Detail) none   Discharge Planning   Support Systems Spouse/significant other   Patient expects to be discharged to: home   Anticipated [place] plan discussed with: Same as interviewed   Mode of transportation: Private car (family member)   Does the patient have perscription coverage? Yes   Financial Resource Strain   How hard is it for you to pay for the very basics like food, housing, medical care, and heating? Not hard   Housing Stability   In the last 12 months, was there a time when you were not able to pay the mortgage or rent on time? N   In the last 12 months, how many places have you lived? 1   In the last 12 months, was there a time when you did not have a steady place to sleep or slept in a shelter (including now)? N    Transportation Needs   In the past 12 months, has lack of transportation kept you from medical appointments or from getting medications? no   In the past 12 months, has lack of transportation kept you from meetings, work, or from getting things needed for daily living? No   Consults/Providers   PT Evaluation Needed 2   OT Evalulation Needed 2   SLP Evaluation Needed 2   Correct PCP listed in Epic? Yes   Family and PCP   PCP on file was verified as the current PCP? Yes   In case you are admitted, transferred or discharged, would like family notified? Yes   Name of family member to be notified Kindall, Kennel (Spouse)  [phone] [facility])   In case you are admitted, transferred or discharged, would like your PCP notified? Yes       Completed inital assessment over phone with: patient's spouse  Verified insurance and PCP.     Patient is independent at baseline. Ambulates independently.  Lives with: spouse  DME as listed above.     No recent [facility] [place], SNF.     Family able to provide transportation home.     Heath Gold BSN, RN  [facility] Management  RN Case Manager I  [address]  [place], [place] [address]  Phone: [phone]

## 2022-12-22 NOTE — Nursing Progress Note (Signed)
Admit to CICU on 3/4 at 2155  AO X 4, on dobutamine gtt, afebrile, no pain, no belongings at the bedside, skin is intact, all safety checks in place, comfort maintained.

## 2022-12-22 NOTE — H&P (Deleted)
Cardiac Intensive Care Unit (CICU)  History and Physical        Patient Name: [name] [name]  MRN: [id number]  Room: [facility]    Chief Complaint    Cardiogenic shock SCAI stage C/D    History of Present Illness       Assessment & Plan   Assessment and Plan   Summary: [name] [name] a 77 y.o. male with PMHx NICM/ICM s/p Medtronik ICD (placed 2013, replaced 2018), CAD s/p PCI with DES 2011, T2DM, COPD, stage IV CLL, CKD, RLE DVT on Eliquis initially presented to [facility] ED with tachycardia, chest pain, found to be in SVT s/p unsuccessful cardioversion with return to NSR s/p adenosine. Since, started on dobutamine and transferred to [place] for LVAD workup/evaluation, now status post right heart cath on3/4.    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  Afterload: continue home hydralazine 25 mg Q8H, and isordil 10 mg TID, hold BB  MCS: None  Inotropy: dobutamine 2.5 mcg/kg/min, may consider increasing given cardiac index low  Device: Medtronik ICD  Latest TTE: 3/4 with EF 11%, severe global hypokinesis   Advanced therapies: was undergoing evaluation for LVAD, delayed by renal function worsening  Continue ASA 81 mg QD and Lipitor 40 mg QHS  Hold eliquis and start heparin infusion, VTE protocol   Blood Pressure Goal: MAP > 65    Initial Reading from RHC  RA  15/15/11  RV 48/1/14  PA 48/21/28  PCWP 17/18/17  FickCO 4.2 L/min  FickCI 2.0 L/min/m2  ThermCO 2.8 L/min  ThermCI 1.3 L/min/m2  PVR 2.1 wu (Fick) 3.1 wu (Therm)   PULMONARY:   Hx of COPD    Last vent settings if on vent:     Ventilator Time (if on vent):     CXR with mild pulmonary edema  Supplemental oxygen as needed to keep sat > 90%  Diuresis as above   RENAL:   AKI on CKD likely from chronic cardiorenal progression      Intake/Output Summary (Last 24 hours) at 12/22/2022 1348  Last data filed at 12/22/2022 1200  Gross per 24 hour   Intake 649.71 ml   Output 2200 ml   Net -1550.29 ml    Follows with [facility], consulted and appreciate recommendations  baseline serum creatinine about 2.5 mg/dL, Serum creatinine at 4 mg/dL   Follow BMP and mag q12hr while diuresing   Avoid nephrotoxin   GASTROINTESTINAL:  Transaminitis in setting of cardioversion    Last Bowel Movement:   Last BM Date: 12/19/22 LFT's daily  Nutrition: Cardiac diet, renal diet  Bowel Regimen: senna/docusate     INFECTIOUS DISEASE:   Leukocytosis  Temp (24hrs), Avg:97.4 F (36.3 C), Min:96.3 F (35.7 C), Max:98 F (36.7 C)     * No values recorded between 12/22/2022 12:25 PM and 12/22/2022  1:48 PM *  Lab Results   Component Value Date    WBC 13.69 (H) 12/21/2022      CAUTI Prevention (n/a if blank):  Foley Day:         Blood Steam Infection Prevention (n/a if blank):   Pulmonary Artery Catheter 12/22/22 Internal jugular Right COVID/flu negative  Last culture dates: Blood cultures 3/4, MRSA 3/4  Likely reactive, hold off on antibiotics for now.                  HEME/ONC:   Hx of RLE DVT on Eliquis  Hx of CLL, on acalabrutinib    heparin (porcine) injection 3,700 Units   heparin (porcine) injection 7,450 Units   heparin 25,000 units in dextrose 5% 500 mL infusion (premix)  heparin gtt  SCDs  Iron profile, type and screen, and anti Xa   Ferritin   ENDO/RHEUM:  T2DM  Sliding scale  HgbA1C  Goal glucose range 100 - 180  Glargine 10  units daily       Current Infusions (n/a if blank): dobutamine       Next of Kin Updated: Open ACP Navigator   Primary Emergency Contact: Beg,Octavia  Yes    Code Status: Full Code  DVT prophylaxis: Heparin GGT  Tubes lines and drains: Pulmonary artery catheter, peripheral IV  Diet: Heart healthy, renal  GI prophylaxis per [place]  Ulcer prophylaxis: None                Subjective   History     Past Medical History:     Past Medical History:   Diagnosis Date    Acute CHF     NOV  & DEC 2018, - DIALYSIS CATH PLACED Aug 24 2017 TO REMOVE FLUID     Acute systolic (congestive) heart failure 08/2017    Anxiety     CAD (coronary artery disease)     Cardiomyopathy     nonischemic    CHF (congestive heart failure) 08/12/2014, 2013    Chronic obstructive pulmonary disease     POSSIBLE PER PT HE USES  SYNBICORT BID ABD  BREO INHALER PRN    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    Coronary artery disease 2011    CORONARY STENT PLACEMENT FOLLOWED BY [facility]    Depression     echocardiogram 02/2016, 11/2016, 09/2017, 12/20/2017    GERD (gastroesophageal reflux disease)     Heart attack 2011    and 2014    Hyperlipemia     Hypertension     ICD (implantable 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     PLACED 2014  [facility]  LAST INTERROGATION April 01 2018 REPORT REQUESTED    Ischemic cardiomyopathy     EF 15% ON ECHO 09-20-2017 DR GARG IN EPIC    Nonischemic cardiomyopathy     NSTEMI (non-ST elevated myocardial infarction)     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    Pacemaker 2014    MEDTRONIC ICD/PACEMAKER COMBO LAST INTERROGATION  12-12 2018    Pneumonia 09/2016    Primary cardiomyopathy     Ischemic cardiomyopathy EF 15% ON ECHO 09-20-2017 DR GARG IN EPIC    Sepsis 08/2017    Syncope and collapse     PRIOR TO ICD/PACEMAKER PLACED 2014    Type 2 diabetes mellitus, controlled DX 1998  BS  AVG 100  A1C 7.4 Dec 27 2017    [place]     PT SEE HIS PMD April 01 2018 RE THIS-TOLD TO SEE PMD BY [facility] JUNE 12 WHEN THEY SAW HIM FOR A CL       Past  Surgical History:     Past Surgical History:   Procedure Laterality Date    BIV  11/10/2016    ICD [place]  UPGRADE     CARDIAC CATHETERIZATION  03/2010    CORONARY STENT PLACEMENT X 2 PER PT; LM normal, LAD 70-80% distal lesion at apex, 20% lesion mid CFX    CARDIAC CATHETERIZATION  12/2012    CARDIAC DEFIBRILLATOR PLACEMENT  12/2012    [facility]    CARDIAC PACEMAKER PLACEMENT  2014    MEDTRONIC ICD/PACEMAKER PLACED [facility]    CIRCUMCISION  AGE 49    COLONOSCOPY  2009    CORRECTION HAMMER TOE      duodenal ulcer  1973    STRESS RELATED IN SCHOOL    ECHOCARDIOGRAM, TRANSTHORACIC  02/2016 11/2016 09/2017 12/2017    ECHOCARDIOGRAM, TRANSTHORACIC  02/2016,11/2016,12/20/2017    EGD N/A 08/15/2014    Procedure: EGD;  Surgeon: Loraine Maple, MD;  Location: [facility];  Service: Gastroenterology;  Laterality: N/A;  egd w/ bx    EGD  1975    EGD, COLONOSCOPY N/A 04/04/2018    Procedure: EGD with bxs, COLONOSCOPY with polypectomy and clipping;  Surgeon: Omer Jack, MD;  Location: [facility];  Service: Gastroenterology;  Laterality: N/A;    FRACTURE SURGERY  AGE 78     RT  ANKLE- CAST APPLIED    HERNIA REPAIR  AGE 49    LEFT INGUINAL HERNIA    MPI nuclear study  06/2016    orthopedic surgery  AGE 78    RT foot corrective - HAMMERTOE    RIGHT HEART CATH Right 09/09/2022    Procedure: Right Heart Cath;  Surgeon: Wandra Arthurs, MD;  Location: [facility];  Service: Cardiovascular;  Laterality: Right;  tx from [place]       Social History:     Social History     Socioeconomic History    Marital status: Married     Spouse name: Architect    Number of children: 0    Years of education: Not on file    Highest education level: Not on file   Occupational History    Occupation: Pharmacist, [employer], history    Tobacco Use    Smoking status: Never    Smokeless tobacco: Never   Vaping Use    Vaping Use: Never used   Substance and Sexual Activity    Alcohol use: Not  Currently    Drug use: No    Sexual activity: Yes     Partners: Female   Other Topics Concern    Not on file   Social History Narrative    Not on file     Social Determinants of Health     Financial Resource Strain: Not on file   Food Insecurity: No Food Insecurity (09/12/2022)    Hunger Vital Sign     Worried About Running Out of Food in the Last Year: Never true     Ran Out of Food in the Last Year: Never true   Transportation Needs: No Transportation Needs (09/12/2022)    [place] - Transportation  Lack of Transportation (Medical): No     Lack of Transportation (Non-Medical): No   Physical Activity: Not on file   Stress: Not on file   Social Connections: Not on file   Intimate Partner Violence: Not At Risk (09/12/2022)    Humiliation, Afraid, Rape, and Kick questionnaire     Fear of Current or Ex-Partner: No     Emotionally Abused: No     Physically Abused: No     Sexually Abused: No   Housing Stability: Low Risk  (09/12/2022)    Housing Stability Vital Sign     Unable to Pay for Housing in the Last Year: No     Number of Places Lived in the Last Year: 1     Unstable Housing in the Last Year: No       Family History:     Family History   Problem Relation Age of Onset    Breast cancer Mother     Heart attack Mother 67    Hypertension Mother     Diabetes Mother     Coronary artery disease Mother     Diabetes Brother     Heart attack Father     Hypertension Father        Allergies:     Allergies   Allergen Reactions    Allopurinol       Home Medications:     Home Medications               apixaban (ELIQUIS) 5 MG     Take 1 tablet (5 mg) by mouth every 12 (twelve) hours For 1 week followed by 1 tablet twice daily     aspirin EC 81 MG EC tablet     Take 1 tablet (81 mg) by mouth daily     atorvastatin (LIPITOR) 40 MG tablet     Take 1 tablet (40 mg total) by mouth nightly     Notes:  PLEASE ADVISE PATIENT TO SCHEDULE OVERDUE FOLLOW-UP WITH GENERAL CARDIOLOGY (DR. Camille Bal) FOR CONTINUED MANAGEMENT/REFILLS.      Calquence 100 MG Tab          hydrALAZINE (APRESOLINE) 25 MG tablet (Expired)     Take 1 tablet (25 mg) by mouth every 8 (eight) hours     Insulin Glargine-yfgn (Semglee, yfgn,) 100 UNIT/ML Solution Pen-injector     50 Units by Subdermal route daily     isosorbide dinitrate (ISORDIL) 10 MG tablet (Expired)     Take 1 tablet (10 mg) by mouth 3 (three) times daily     Jardiance 10 MG tablet     TAKE 1 TABLET(10 MG) BY MOUTH DAILY     loratadine (CLARITIN) 10 MG tablet     Take 1 tablet (10 mg) by mouth daily     metoprolol succinate XL (TOPROL-XL) 25 MG 24 hr tablet     Take 0.5 tablets (12.5 mg) by mouth daily     niacin (NIASPAN) 500 MG CR tablet     TAKE 1 TABLET BY MOUTH TWICE DAILY     Patient taking differently: 250 mg Taking half a tablet     torsemide (DEMADEX) 20 MG tablet     Take 2 tablets (40 mg) by mouth 2 (two) times daily                         Examination:   Vitals Temp:  [96.3 F (35.7 C)-98 F (36.7 C)]  Heart Rate:  [76-94]   Resp Rate:  [12-38]   BP: (92-113)/(54-83)   SpO2:  [88 %-98 %]   Height:  [182.9 cm (6')]   Weight:  [93.1 kg (205 lb 4 oz)]   BMI (calculated):  [27.8]  // Temperature with 24 range    Neuro:    Non-focal neuro exam alert & oriented x 3     Lungs:   clear to auscultation, no wheezes, rales or rhonchi, symmetric air entry     Cardiac:   normal rate, regular rhythm, normal S1, S2, no murmurs, rubs, clicks or gallops     Abdomen:    soft, nontender, nondistended, no masses or organomegaly     Extremities:   peripheral pulses normal    Skin:     Warm, dry and intact Fick/Cardiac Hemodynamics   VO2 Determination  Height: 182.9 cm (6')                   SVR Determination  MAP (mmHg): 74         Most Recent Labs  13.69* \ 12.1* / 110*    / 38.2 \    03/04 2247    132* 101 62.0* / 276*   4.9 19 4.0* \    03/05 0424                      Thelma Comp, DO  12/22/2022 1:48 PM     {

## 2022-12-22 NOTE — Procedures (Signed)
H&P UPDATE WITH ASA/MALLAMPATTI    Date Time: 12/22/22 12:07 PM    PROCEDURE:    Right heart cath  INDICATIONS:    cardiomyopathy  H&P:    The history and physical including past medical, family, and social history were reviewed and there are no significant interval changes from what is currently available in the chart from prior evaluation.  Ischemic cardiomyopathy.  CHF.  SCAI stage C cardiogenic shock.  Being evaluated for LVAD.  "Leave in" RHC requested to help guide therapy/clinical decision making.  Will plan for R IJ (he had prior RHC from arm but will use R IJ in order to provide a stable position for the RHC that will not compel bedrest).  He has no complaints.  He was seen and examined by me prior to the procedure.   ALLERGIES:    Allopurinol   LABS:      Lab Results   Component Value Date    WBC 13.69 (H) 12/21/2022    HGB 12.1 (L) 12/21/2022    HCT 38.2 12/21/2022    PLT 110 (L) 12/21/2022    NA 132 (L) 12/22/2022    K 4.9 12/22/2022    CL 101 12/22/2022    CO2 19 12/22/2022    MG 2.8 (H) 12/22/2022    BUN 62.0 (H) 12/22/2022    CREAT 4.0 (H) 12/22/2022    EGFR 14.8 (A) 12/22/2022    GLU 276 (H) 12/22/2022       ASA PHYSICAL STATUS    Class 3 - Severe systemic disease, limits normal activity but not incapacitating  MALLAMPATTI AIRWAY CLASSIFICATION    Class III: Soft and hard palate and base of the uvula are visible  PLANNED SEDATION:    ( ) NO SEDATION   (x) MODERATE SEDATION   ( ) DEEP SEDATION WITH ANESTHESIA   CONCLUSION:    The risks, benefits and alternatives of the procedure have been discussed in detail and he has indicated that he understands the procedure, indications, and risks inherent to the procedure and is amenable to proceeding.  All questions were answered. Informed consent was signed and verified.      Signed by: Lillette Boxer Jayln Madeira, MD

## 2022-12-22 NOTE — Procedures (Signed)
RIGHT HEART CATH NOTE  [facility]    Date Time: 12/22/22 1:03 PM    Patient Name:   [name] [name]    Date of Cardiac Cath:   12/22/2022    Providers Performing:   Surgeon(s):  Wilbur Oakland, Lillette Boxer, MD      Operative Procedure:   "Leave in" RHC    Preoperative Diagnosis:   Ischemic cardiomyopathy, stage C SCAI, LVAD evaluation    Postoperative Diagnosis:   Elevated RA, RA, PCWP  Low CO, CI    Clinical:   [name] is a 77 y.o. M with h/o CAD, ischemic cardiomyopathy.  Now SCAI stage C cardiogenic shock.  "Leave in" RHC requested to help guide therapy.  LVAD evaluation in progress.    Anesthesia:   Versed, fentanyl    Stents   * No implants in log *      Findings:   Informed consent was obtained up in the room.  The patient was then transported to the Cath Lab.  Neck veins were assessed prior to draping with ultrasound.  The right IJ appears appropriate for use.  He was then prepped and draped in usual fashion.  Conscious sedation was provided with Versed and fentanyl.  Using ultrasound and a micropuncture kit we obtained venous access.  We exchanged that sheath after skin neck and a 6 [place] dilator [place] dilator 48 French sheath.  We then advanced the balloon to catheter guided by pressures and fluoroscopy.  Pressures recorded on the way in.  Fick and thermodilution outputs then measured.  Catheter left with the balloon down in the pulmonary artery.  The wedge pressure and pulmonary artery diastolic pressures to track such that we inflations of the balloon or likely not necessary (PA diastolic a good surrogate for PCWP) but if the balloon is reinflated the catheter does wedge current position.  57 cm.      RA 15/15/11  RV 48/1/14  PA 48/21/28  PCWP 17/18/17  RA sat 58.9 (pulsox 99%  FickCO 4.2 L/min  FickCI 2.0 L/min/m2  ThermCO 2.8 L/min  ThermCI 1.3 L/min/m2  PVR 2.1 wu (Fick) 3.1 wu (Therm)    RHC/sleeve.  Positioned at 58 cm.    IMPRESSIONS:  Elevated RA, PA, PCWP.  Reduced CO, CI.    Complications:    none    Plan:   Return to CCU.  Further plans per primary teams.    [facility] available at [phone] if any questions or concerns for [place] while in CCU.          Signed by: Lillette Boxer Emryn Flanery, MD, MD                                                                              [place]  NP [place] (8am-5pm)  MD Spectralink [phone] or [phone] (8am-5pm)  Arrhythmia Spectralink [phone] (8am-4:30pm)  After hours, non urgent consult line [phone]  After Hours, urgent consults [phone]

## 2022-12-22 NOTE — Progress Notes (Signed)
Cardiac Intensive Care Unit (CICU)  History and Physical        Patient Name: [name] [name]  MRN: [id number]  Room: FI102/FI102-01    Chief Complaint    Cardiogenic shock SCAI stage C/D    History of Present Illness       Assessment & Plan   Assessment and Plan   Summary: Saburo Teaff a 77 y.o. male with PMHx NICM/ICM s/p Medtronik ICD (placed 2013, replaced 2018), CAD s/p PCI with DES 2011, T2DM, COPD, stage IV CLL, CKD, RLE DVT on Eliquis initially presented to [facility] ED with tachycardia, chest pain, found to be in SVT s/p unsuccessful cardioversion with return to NSR s/p adenosine. Since, started on dobutamine and transferred to [place] for LVAD workup/evaluation, now status post right heart cath on3/4.    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 good response ~2 L out   Afterload: continue home hydralazine 25 mg Q8H, and isordil 10 mg TID, hold BB  MCS: None  Inotropy: dobutamine 2.5 mcg/kg/min, may consider increasing given cardiac index low  Device: Medtronik ICD  Latest TTE: 3/4 with EF 11%, severe global hypokinesis   Advanced therapies: was undergoing evaluation for LVAD, delayed by renal function worsening  Continue ASA 81 mg QD and Lipitor 40 mg QHS  Hold eliquis and start heparin infusion, VTE protocol   Blood Pressure Goal: MAP > 65    Initial  Reading from RHC  RA 15/15/11  RV 48/1/14  PA 48/21/28  PCWP 17/18/17  FickCO 4.2 L/min  FickCI 2.0 L/min/m2  ThermCO 2.8 L/min  ThermCI 1.3 L/min/m2  PVR 2.1 wu (Fick) 3.1 wu (Therm)   PULMONARY:   Hx of COPD    Last vent settings if on vent:     Ventilator Time (if on vent):     CXR with mild pulmonary edema  Supplemental oxygen as needed to keep sat > 90%  Diuresis as above   RENAL:   AKI on CKD likely from chronic cardiorenal progression      Intake/Output Summary (Last 24 hours) at 12/22/2022 1515  Last data filed at 12/22/2022 1429  Gross per 24 hour   Intake 747.63 ml   Output 3350 ml   Net -2602.37 ml      Follows with [facility], consulted and appreciate recommendations  baseline serum creatinine about 2.5 mg/dL, Serum creatinine at 4 mg/dL   Follow BMP and mag q12hr while diuresing   Avoid nephrotoxin   GASTROINTESTINAL:  Transaminitis in setting of cardioversion    Last Bowel Movement:   Last BM Date: 12/19/22 LFT's daily  Nutrition: Cardiac diet, renal diet  Bowel Regimen: senna/docusate  INFECTIOUS DISEASE:   Leukocytosis    Temp (24hrs), Avg:97.8 F (36.6 C), Min:97.5 F (36.4 C), Max:98 F (36.7 C)     * No values recorded between 12/22/2022 12:25 PM and 12/22/2022  3:15 PM *  Lab Results   Component Value Date    WBC 13.69 (H) 12/21/2022      CAUTI Prevention (n/a if blank):  Foley Day:         Blood Steam Infection Prevention (n/a if blank):   Pulmonary Artery Catheter 12/22/22 Internal jugular Right COVID/flu negative  Last culture dates: Blood cultures 3/4, MRSA 3/4  Likely reactive, hold off on antibiotics for now.                  HEME/ONC:   Hx of RLE DVT on Eliquis  Hx of CLL, on acalabrutinib    heparin (porcine) injection 3,700 Units   heparin (porcine) injection 7,450 Units   heparin 25,000 units in dextrose 5% 500 mL infusion (premix)  heparin gtt  SCDs  Iron profile, type and screen, and anti Xa   Ferritin   ENDO/RHEUM:  T2DM  Sliding scale  HgbA1C  Goal glucose range 100  - 180  Glargine 10 units daily       Current Infusions (n/a if blank): dobutamine       Next of Kin Updated: Open ACP Navigator   Primary Emergency Contact: Ickes,Octavia  Yes    Code Status: Full Code  DVT prophylaxis: Heparin GGT  Tubes lines and drains: Pulmonary artery catheter, peripheral IV  Diet: Heart healthy, renal  GI prophylaxis per [place]  Ulcer prophylaxis: None                Subjective   History     Past Medical History:     Past Medical History:   Diagnosis Date    Acute CHF     NOV  & DEC 2018, - DIALYSIS CATH PLACED Aug 24 2017 TO REMOVE FLUID     Acute systolic (congestive) heart failure 08/2017    Anxiety     CAD (coronary artery disease)     Cardiomyopathy     nonischemic    CHF (congestive heart failure) 08/12/2014, 2013    Chronic obstructive pulmonary disease     POSSIBLE PER PT HE USES  SYNBICORT BID ABD  BREO INHALER PRN    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    Coronary artery disease 2011    CORONARY STENT PLACEMENT FOLLOWED BY [facility]    Depression     echocardiogram 02/2016, 11/2016, 09/2017, 12/20/2017    GERD (gastroesophageal reflux disease)     Heart attack 2011    and 2014    Hyperlipemia     Hypertension     ICD (implantable 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     PLACED 2014   HEART  LAST INTERROGATION April 01 2018 REPORT REQUESTED    Ischemic cardiomyopathy     EF 15% ON ECHO 09-20-2017 DR GARG IN EPIC    Nonischemic cardiomyopathy     NSTEMI (non-ST elevated myocardial infarction)     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    Pacemaker 2014    MEDTRONIC ICD/PACEMAKER COMBO LAST INTERROGATION  12-12 2018    Pneumonia 09/2016    Primary cardiomyopathy     Ischemic cardiomyopathy EF 15% ON ECHO 09-20-2017 DR GARG IN EPIC    Sepsis 08/2017    Syncope and collapse     PRIOR TO ICD/PACEMAKER PLACED 2014  Type 2 diabetes mellitus, controlled DX 1998    BS  AVG 100  A1C 7.4 Dec 27 2017    Wheeze     PT SEE HIS PMD April 01 2018 RE THIS-TOLD TO SEE PMD BY [facility] JUNE 12 WHEN THEY SAW HIM  FOR A CL       Past Surgical History:     Past Surgical History:   Procedure Laterality Date    BIV  11/10/2016    ICD [place]  UPGRADE     CARDIAC CATHETERIZATION  03/2010    CORONARY STENT PLACEMENT X 2 PER PT; LM normal, LAD 70-80% distal lesion at apex, 20% lesion mid CFX    CARDIAC CATHETERIZATION  12/2012    CARDIAC DEFIBRILLATOR PLACEMENT  12/2012    [facility]    CARDIAC PACEMAKER PLACEMENT  2014    MEDTRONIC ICD/PACEMAKER PLACED [facility]    CIRCUMCISION  AGE 43    COLONOSCOPY  2009    CORRECTION HAMMER TOE      duodenal ulcer  1973    STRESS RELATED IN SCHOOL    ECHOCARDIOGRAM, TRANSTHORACIC  02/2016 11/2016 09/2017 12/2017    ECHOCARDIOGRAM, TRANSTHORACIC  02/2016,11/2016,12/20/2017    EGD N/A 08/15/2014    Procedure: EGD;  Surgeon: Loraine Maple, MD;  Location: [facility];  Service: Gastroenterology;  Laterality: N/A;  egd w/ bx    EGD  1975    EGD, COLONOSCOPY N/A 04/04/2018    Procedure: EGD with bxs, COLONOSCOPY with polypectomy and clipping;  Surgeon: Omer Jack, MD;  Location: [facility];  Service: Gastroenterology;  Laterality: N/A;    FRACTURE SURGERY  AGE 27     RT  ANKLE- CAST APPLIED    HERNIA REPAIR  AGE 43    LEFT INGUINAL HERNIA    MPI nuclear study  06/2016    orthopedic surgery  AGE 67    RT foot corrective - HAMMERTOE    RIGHT HEART CATH Right 09/09/2022    Procedure: Right Heart Cath;  Surgeon: Wandra Arthurs, MD;  Location: [facility];  Service: Cardiovascular;  Laterality: Right;  tx from [place]       Social History:     Social History     Socioeconomic History    Marital status: Married     Spouse name: Architect    Number of children: 0    Years of education: Not on file    Highest education level: Not on file   Occupational History    Occupation: Pharmacist, 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[employer], history    Tobacco Use    Smoking status: Never    Smokeless tobacco: Never   Vaping Use    Vaping Use: Never used   Substance and Sexual Activity     Alcohol use: Not Currently    Drug use: No    Sexual activity: Yes     Partners: Female   Other Topics Concern    Not on file   Social History Narrative    Not on file     Social Determinants of Health     Financial Resource Strain: Low Risk  (12/22/2022)    Overall Financial Resource Strain (CARDIA)     Difficulty of Paying Living Expenses: Not hard at all   Food Insecurity: No Food Insecurity (09/12/2022)    Hunger Vital Sign     Worried About Running Out of Food in the Last Year: Never true  Ran Out of Food in the Last Year: Never true   Transportation Needs: No Transportation Needs (12/22/2022)    PRAPARE - Armed forces logistics/support/administrative officer (Medical): No     Lack of Transportation (Non-Medical): No   Physical Activity: Not on file   Stress: Not on file   Social Connections: Not on file   Intimate Partner Violence: Not At Risk (09/12/2022)    Humiliation, Afraid, Rape, and Kick questionnaire     Fear of Current or Ex-Partner: No     Emotionally Abused: No     Physically Abused: No     Sexually Abused: No   Housing Stability: Low Risk  (12/22/2022)    Housing Stability Vital Sign     Unable to Pay for Housing in the Last Year: No     Number of Places Lived in the Last Year: 1     Unstable Housing in the Last Year: No       Family History:     Family History   Problem Relation Age of Onset    Breast cancer Mother     Heart attack Mother 85    Hypertension Mother     Diabetes Mother     Coronary artery disease Mother     Diabetes Brother     Heart attack Father     Hypertension Father        Allergies:     Allergies   Allergen Reactions    Allopurinol       Home Medications:     Home Medications               apixaban (ELIQUIS) 5 MG     Take 1 tablet (5 mg) by mouth every 12 (twelve) hours For 1 week followed by 1 tablet twice daily     aspirin EC 81 MG EC tablet     Take 1 tablet (81 mg) by mouth daily     atorvastatin (LIPITOR) 40 MG tablet     Take 1 tablet (40 mg total) by mouth nightly     Notes:  PLEASE  ADVISE PATIENT TO SCHEDULE OVERDUE FOLLOW-UP WITH GENERAL CARDIOLOGY (DR. Camille Bal) FOR CONTINUED MANAGEMENT/REFILLS.     Calquence 100 MG Tab          hydrALAZINE (APRESOLINE) 25 MG tablet (Expired)     Take 1 tablet (25 mg) by mouth every 8 (eight) hours     Insulin Glargine-yfgn (Semglee, yfgn,) 100 UNIT/ML Solution Pen-injector     50 Units by Subdermal route daily     isosorbide dinitrate (ISORDIL) 10 MG tablet (Expired)     Take 1 tablet (10 mg) by mouth 3 (three) times daily     Jardiance 10 MG tablet     TAKE 1 TABLET(10 MG) BY MOUTH DAILY     loratadine (CLARITIN) 10 MG tablet     Take 1 tablet (10 mg) by mouth daily     metoprolol succinate XL (TOPROL-XL) 25 MG 24 hr tablet     Take 0.5 tablets (12.5 mg) by mouth daily     niacin (NIASPAN) 500 MG CR tablet     TAKE 1 TABLET BY MOUTH TWICE DAILY     Patient taking differently: 250 mg Taking half a tablet     torsemide (DEMADEX) 20 MG tablet     Take 2 tablets (40 mg) by mouth 2 (two) times daily  Examination:   Vitals Temp:  [97.5 F (36.4 C)-98 F (36.7 C)]   Heart Rate:  [76-94]   Resp Rate:  [12-38]   BP: (92-113)/(54-82)   SpO2:  [88 %-99 %]   Height:  [182.9 cm (6')]   Weight:  [93.1 kg (205 lb 4 oz)]   BMI (calculated):  [27.8]  // Temperature with 24 range    Neuro:    Non-focal neuro exam alert & oriented x 3     Lungs:   clear to auscultation, no wheezes, rales or rhonchi, symmetric air entry     Cardiac:   normal rate, regular rhythm, normal S1, S2, no murmurs, rubs, clicks or gallops     Abdomen:    soft, nontender, nondistended, no masses or organomegaly     Extremities:   peripheral pulses normal    Skin:     Warm, dry and intact Fick/Cardiac Hemodynamics   VO2 Determination  Height: 182.9 cm (6')              PVR Determination  PAP: 48/14  Pulmonary Artery Pulsatility Index (PAPI): 6.8    SVR Determination  MAP (mmHg): 82  CVP (mmHg): 5 mmHg  MAP - CVP: 77         Most Recent Labs  13.69* \ 12.1* / 110*    / 38.2 \     03/04 2247    132* 101 62.0* / 276*   4.9 19 4.0* \    03/05 0424                      Thelma Comp, DO  12/22/2022 3:15 PM     {

## 2022-12-22 NOTE — UM Notes (Addendum)
Patient Name: [name] [name], [name] [name]  DOB: [birth year]  Unit: [facility] CICU  Level of Care:  ICU    Admission Order:  Admit to Inpatient (Order [id number]) on 12/22/22     ADMISSION REVIEW:  Presents 12/21/2022 at 2155. 77 y.o. male with PMHx NICM/ICM s/p Medtronik ICD (placed 2013, replaced 2018), CAD s/p PCI with DES 2011, T2DM, COPD, stage IV CLL, CKD, RLE DVT on Eliquis initially presented to [facility] ED with tachycardia, chest pain, found to be in SVT s/p unsuccessful cardioversion with return to NSR s/p adenosine. Since, started on dobutamine and transferred to [place] for LVAD workup/evaluation.     Arrival VS: Vitals BP: 97/67, Temp: 97.5 F (36.4 C), Temp Source: Oral, Heart Rate: 87, Resp Rate: 20, SpO2: 98 %, Height: 182.9 cm (6'), Weight: 93.1 kg (205 lb 4 oz)    PMH:  has a past medical history of Acute CHF, Acute systolic (congestive) heart failure (08/2017), Anxiety, CAD (coronary artery disease), Cardiomyopathy, CHF (congestive heart failure) (08/12/2014, 2013), Chronic obstructive pulmonary disease, CLL (chronic lymphocytic leukemia) (10/26/2022), Coronary artery disease (2011), Depression, echocardiogram (02/2016, 11/2016, 09/2017, 12/20/2017), GERD (gastroesophageal reflux disease), Heart attack (2011), Hyperlipemia, Hypertension, ICD (implantable 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, Ischemic cardiomyopathy, Nonischemic cardiomyopathy, NSTEMI (non-ST elevated myocardial infarction), 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  PSH:  has a past surgical history that includes duodenal ulcer (1973); orthopedic surgery (AGE 59); EGD (N/A, 08/15/2014); Circumcision (AGE 58); Fracture surgery (AGE 36); Hernia repair (AGE 58); Colonoscopy (2009); Tonsillectomy and adenoidectomy (1954); Cardiac defibrillator placement (12/2012); Cardiac pacemaker  placement (2014); EGD (1975); EGD, COLONOSCOPY (N/A, 04/04/2018); ECHOCARDIOGRAM, TRANSTHORACIC (02/2016 11/2016 09/2017 12/2017); MPI nuclear study (06/2016); Correction hammer toe; BIV (11/10/2016); ECHOCARDIOGRAM, TRANSTHORACIC (02/2016,11/2016,12/20/2017); Cardiac catheterization (03/2010); Cardiac catheterization (12/2012); and Right Heart Cath (Right, 09/09/2022).    Abnormal Labs:   12/21/22 22:47   WBC 13.69 (H)   Hemoglobin 12.1 (L)   Hematocrit 38.2   Platelet Count 110 (L)   RBC 3.66 (L)      12/21/22 22:47   Glucose 199 (H)   BUN 61.0 (H)   Creatinine 4.2 (H)   Sodium 135   Anion Gap 17.0 (H)   eGFR 13.9 !   Magnesium 2.9 (H)   Phosphorus 7.2 (H)   Lactic Acid 2.2 (H)   AST 426 (H)   ALT 425 (H)   Albumin 3.1 (L)   Bilirubin Total 1.8 (H)      12/21/22 22:47   hs Troponin-I 192.3 !!   NT-proBNP 15,327 (H)   Iron 32 (L)      12/21/22 22:47   Procalcitonin 1.13 (H)     Medications:  Lipitor 40 mg PO x 1    Continuous:  Dobutamine Gtt    MD Assessment/Plan:  Cardiac Critical Care:  IMPRESSIONS:   Heart failure-cardiogenic shock, SCAI stage C  Acute on chronic decompensated heart failure, LVEF less than 15%  TTE 3/4: Severely reduced LV function with EF 11%, LVIDD 6.8 cm, global hypokinesis, mildly dilated RV with moderately decreased RV systolic function, severe functional MR, severe tricuspid regurgitation with RVSP 51 mmHg  Ischemic cardiomyopathy, NYHA class IV, ACC/AHA stage D, currently undergoing DT LVAD evaluation  RHC 09/09/22: RA 7, RV 39/4, PA 41/21/29, PCWP 14, PA sat 62%, RA sat 59%, AO sat 95%, Fick CO/CI 4.0/2.0, TD CO/CI 3.5/1.7, MAP 89, PAPi 3, PVR 3.75-4.3 WU  Narrow complex  supraventricular tachycardia, s/p unsuccessful cardioversion 3/4  Coronary artery disease s/p PCI/DES in 2011 with 3 stents and then 2 stents when he was in [place]  S/p Medtronic BiV ICD  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  Severe tricuspid regurgitation  Hyperlipidemia  Hypertension  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  If he decompensates, would consider IABP as a bridge to DT LVAD  Inotropy:  Continue dobutamine at 2.5 mg/kg/min  N.p.o. at midnight for right heart cath with leave in [place] tomorrow  Preload:  Takes torsemide 40 mg twice daily at home, will give him Bumex 2 mg now and repeat dosing based on right heart cath numbers  Goal CVP less than 10  Afterload:  Continue home hydralazine 25 mg 3 times daily and Isordil 10 mg 3 times daily for afterload reduction  Advance therapies evaluation:  Currently being evaluated for DT LVAD  GDMT for Heart failure  Beta blocker: Hold in setting of cardiogenic shock  ACE/ARB/ARNI: Hold in setting of worsening renal function  MRA: Hold in setting of worsening renal function.  Previously has had hyperkalemia issues  SGLT2i: Hold for n.p.o., procedures, and reduced renal function  Switch Eliquis to heparin drip and follow aPTT protocol  Rest of care per [place] discussed with team, nursing, and patient/family      DAY 2 PROGRESS NOTE:  CSR Date: 12/22/2022 [facility] CICU    IV Bumex. Heparin Gtt. Dobutamine gtt. Right heart cath with leave swan in today. Shows elevated pressures with reduced CO/CI.    O2:  3 L NC Diet:  NPO    Vitals:  Temp:  [96.2 F (35.7 C)-98.8 F (37.1 C)]   Heart Rate:  [80-94]   Resp Rate:  [4-38]   BP: (91-113)/(62-88)   SpO2:  [91 %-98 %]   Height:  [182.9 cm (6')]   Weight:  [93.1 kg (205 lb 4 oz)]   BMI (calculated):  [27.8]     I&O:  I/O last 3 completed shifts:  In: 454.4 [P.O.:400; I.V.:54.4]  Out: 1300 [Urine:1300]    Abnormal Labs:    12/22/22 04:24   BUN 62.0 (H)   Creatinine 4.0 (H)   Sodium 132 (L)   eGFR 14.8 !   Magnesium 2.8 (H)   Lactic Acid 2.1 (H)   AST 322 (H)   ALT 409 (H)   Albumin 3.1 (L)   Bilirubin Total 1.5 (H)   hs Troponin-I 154.6 !!     Post-Op Note:  Date of Cardiac Cath:   12/22/2022  Operative Procedure:   "Leave in" RHC  Preoperative Diagnosis:   Ischemic cardiomyopathy, stage C SCAI, LVAD evaluation  Postoperative Diagnosis:   Elevated RA, RA, PCWP  Low CO, CI  Findings:  RA 15/15/11  RV 48/1/14  PA 48/21/28  PCWP 17/18/17  RA sat 58.9 (pulsox 99%  FickCO 4.2 L/min  FickCI 2.0 L/min/m2  ThermCO 2.8 L/min  ThermCI 1.3 L/min/m2  PVR 2.1 wu (Fick) 3.1 wu (Therm)  RHC/sleeve.  Positioned at 58 cm.  IMPRESSIONS:  Elevated RA, PA, PCWP.  Reduced CO, CI.  Medications:  Scheduled Meds:  Current Facility-Administered Medications   Medication Dose Route Frequency    aspirin EC  81 mg Oral Daily    atorvastatin  40 mg Oral QHS    hydrALAZINE  25 mg Oral Q8H [place]    insulin lispro  1-8 Units Subcutaneous Q4H [place]    isosorbide dinitrate  10 mg Oral TID - Nitrate Free Interval    niacin  500 mg Oral BID    senna-docusate  1 tablet Oral Q12H [place]     Continuous Infusions:   DOBUTamine 2.5 mcg/kg/min (12/22/22 0800)    heparin infusion 25,000 units/500 mL (VTE/Moderate Intensity) 18 Units/kg/hr (12/22/22 0817)     IV One-Time & PRN Medications:  Bumex 2 mg IV x 1    Cardiac Critical Care:  Heart failure-cardiogenic shock, SCAI stage C  Acute on chronic decompensated heart failure, LVEF less than 15%  Preload: Lasix '160mg'$  IV for FG of -2 L  Inotrope: C/ dobutamine 2.5 mcg/kg/min   Afterload: hydralazine 25 mg 3 times daily and Isordil 10 mg 3 times daily for afterload reduction   MCS: If he decompensates, would consider IABP as a bridge to DT LVAD   Advanced therapies candidacy:Currently being evaluated for DT LVAD. Concern for kidney function but we will plan for Swan guided optimization, baseline creatinine in 03/2022 was 1.5   In case of  deterioration: IABP as above  GDMT for Heart failure  Beta blocker: Hold in setting of cardiogenic shock  ACE/ARB/ARNI: Hold in setting of worsening renal function  MRA: Hold in setting of worsening renal function.  Previously has had hyperkalemia issues  SGLT2i: Hold for n.p.o., procedures, and reduced renal function  Right lower extremity DVT  Likely provoked in the setting of cancer  C/ heparin infusion   Rest of care per [facility]       Primary Payor: [employer] / Plan: CIGNA POS / Product Type: COMMERCIAL /   UTILIZATION REVIEW CONTACT: Robyne Askew RN, BSN  Utilization Review   [place]  [phone]  [phone]  Email: [email or web address]  Tax [id number]      NOTES TO REVIEWER:     This clinical review is based on/compiled from documentation provided by the treatment team within the patient's medical record.

## 2022-12-22 NOTE — Progress Notes (Addendum)
Contact patient seen and examined. Agree with assessment and plan as per ICU team note with the following additions/caveats:    77 year old man history of nonischemic and ischemic cardiomyopathy, prior [employer] ICD, coronary disease status post prior PCI with drug-eluting stent 2011.  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  Initially presented to [facility] with tachycardia, chest discomfort found in SVT.  Patient underwent cardioversion x 1, adenosine with conversion to normal sinus rhythm following.  Patient was initiated on dobutamine therapy for acute decompensated heart failure, transferred to [facility] for LVAD workup and evaluation previously underway.      Fick/Cardiac Hemodynamics   VO2 Determination  Height: 182.9 cm (6' 0.01")  Weight FICK (only): 93.1 kg (205 lb 4 oz)  BSA (Fick only) (Calculated - sq m): 2.17 sq meters  VO2: 271.25    CO/CI Determination  SaO2 : 0.99  SvO2 : 0.7  SaO2 - SvO2: 0.29  HGB Result: 12.1  x 1.36 x 10: 47.72  Cardiac Output (FICK): 5.68  Cardiac Index (FICK): 2.62  Cardiac Power Output: 1.03    PVR Determination  PAP: 46/18  Pulmonary Artery Pulsatility Index (PAPI): 2.8    SVR Determination  MAP (mmHg): 82  CVP (mmHg): 10 mmHg  MAP - CVP: 72  SVR (dynes/second/cm3): 1084.51 dynes/second/cm3          Overnight Events: SVT, cardioversion and adenosine administered  Transferred from [facility], on dobutamine infusion for consideration for LVAD    Assessment:  Cardiogenic shock, SCAI stage C  Acute on chronic systolic heart failure, EF <15%, ischemic cardiomyopathy, 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 Trend Fick and thermodilution  Continue dobutamine, dose increased to 5 mcg/kg/min  Amiodarone bolus and infusion for ectopy  Interrogation of ICD today  Aggressive diuresis, lasix 160 IV x 1, reassess based on filling pressures  Continue ASA, lipitor, hydralazine, isordil  Nephrology following, with baseline CKD  LVAD workup underway  Insulin glargine, sliding scale insulin as needed to maintain glucose less than 180    Lines/tubes/foley:  Right IJ venous sheath, PA catheter 3/5  PIV    CODE STATUS: Full code    DVT prophylaxis: Heparin infusion, history of DVT  GI prophylaxis: not Indicated    Nutrition started within 24 hours: Yes    Discussed in detail with bedside nurse, ICU team, CICU cardiologist Dr. Merrilyn Puma, discussed with patient and his wife at bedside.     This patient has a high probability of sudden clinically significant deterioration which requires the highest level of physician preparedness to intervene urgently. I managed/supervised life or organ supporting interventions that required frequent physician assessments.   I devoted my full attention to the direct care of this patient for this period of time.      Organ systems which are failing and require intensive, critical care support are:   Invasive hemodynamic monitoring with PA catheter  IV inotropic support     Any critical care time was performed today and is  exclusive of teaching, billable procedures, and not overlapping with any other providers.     Critical care time:40 minutes

## 2022-12-22 NOTE — Progress Notes (Signed)
Patient's Name: [name] [name]   Admit Date: 12/21/2022  Medical Record Number: [id number]   Code Status: full code  Room: FI102/FI102-01  Date/Time: 12/22/22 9:43 AM  Cardiologist of record: [facility] (Dr. Judeth Horn)    INTERVAL EVENTS:  Admitted to the CICU   Vitals: Afebrile, HR high 70s to low 80s, RR 15-20, BP 100s/60s, 96% on 3L NC   Telemetry reviewed: 2 episodes of NSVT, measuring < 6 beats  I/O:  -900cc  Labs: WBC 13.69 (13.49), Hgb 12.1 (13.9), platelets 110 (159), BUN 62 (61), Cr 4.0 (4.2, 3.9, 3.3), Sodium 132 (135), Lactic acid 2.1 (2.2, 2.9), AST 322 (426), ALT 409 (425)  Imaging/Diagnostics: CXR with pulmonary edema   Microbiology: None overnight  Current Infusions: dobutamine 2.5 mcg/kg/min, heparin    Subjectively, feels well, denies any shortness of breath, orthopnea, PND, nausea, emesis.     IMPRESSIONS   Heart failure-cardiogenic shock, SCAI stage C  Acute on chronic decompensated heart failure, LVEF less than 15%  TTE 3/4: Severely reduced LV function with EF 11%, LVIDD 6.8 cm, global hypokinesis, mildly dilated RV with moderately decreased RV systolic function, severe functional MR, severe tricuspid regurgitation with RVSP 51 mmHg  Ischemic cardiomyopathy, NYHA class IV, ACC/AHA stage D, currently undergoing DT LVAD evaluation  RHC 09/09/22: RA 7, RV 39/4, PA 41/21/29, PCWP 14, PA sat 62%, RA sat 59%, AO sat 95%, Fick CO/CI 4.0/2.0, TD CO/CI 3.5/1.7, MAP 89, PAPi 3, PVR 3.75-4.3 WU  Supraventricular tachycardia, converted with adenosine 3/4  Coronary artery disease s/p PCI/DES in 2011 with 3 stents and then 2 stents when he was in [place]  S/p Medtronic BiV ICD  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  Severe tricuspid regurgitation  Hyperlipidemia  Hypertension  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  Stage IV CLL, diagnosed in September 2021, 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  Right lower extremity DVT 5/23  Type 2 diabetes with an A1c 5.9%  Stable COPD  Multiple hospital admissions for acute decompensated  heart failure in 2023    RECOMMENDATIONS  Heart failure-cardiogenic shock, SCAI stage C  Acute on chronic decompensated heart failure, LVEF less than 15%  Preload: Lasix '160mg'$  IV for FG of -2 L  Inotrope: C/ dobutamine 2.5 mcg/kg/min   Afterload: hydralazine 25 mg 3 times daily and Isordil 10 mg 3 times daily for afterload reduction   MCS: If he decompensates, would consider IABP as a bridge to DT LVAD   Advanced therapies candidacy:Currently being evaluated for DT LVAD. Concern for kidney function but we will plan for Swan guided optimization, baseline creatinine in 03/2022 was 1.5   In case of deterioration: IABP as above  GDMT for Heart failure  Beta blocker: Hold in setting of cardiogenic shock  ACE/ARB/ARNI: Hold in setting of worsening renal function  MRA: Hold in setting of worsening renal function.  Previously has had hyperkalemia issues  SGLT2i: Hold for n.p.o., procedures, and reduced renal function    Right lower extremity DVT  Likely provoked in the setting of cancer  C/ heparin infusion   Rest of care per [facility]     Toya Smothers, MD  Cardiology Fellow, PGY4   12/22/22 9:43 AM          Reason for Consult: AHF-CS    History:  77 y.o. 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  He was given a dose of adenosine and had an unsuccessful cardioversion with 120 J.  Adenosine eventually converted to sinus rhythm and telemetry showed a sensed V paced rhythms.    Admitted to ICU at [facility] for further management. However, then transferred to [place] on new Dobutamine 2.5 mcg/kg/min for LVAD consideration.      Of note follows with [facility]. Has been followed by [facility] for advanced therapies,  including LVAD where he has been seen by Dr. Alain Marion. Home medications include Eliquis 2.5 mg BID, ASA 81 mg QD, Lipitor 40 mg QHS, Jardiance 10 mg QD, Hydralazine 25 mg Q8H, Isosorbide Dinitrate 10 mg TID, metoprolol succinate XL 12. 5 mg QD, torsemide 40 mg BID, Lantus 50U QD. Was previously on Aldactone but discontinued given renal function. Dry weight is around 185 lb per 10/2022, weight today around 195-199 lb.     Follows with Dr. Mervin Kung of [facility] for CLL. Dx'd in 06/2020. Thought to be well controlled on acalabrutinib.     Follows with [facility] for CKD. Cr increased from 1.5 in 03/2022 to 2.3 in 09/2022. Previously on HD/UF in 2018.     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  He lives with his wife in [place], previously was living in [place] but due to high cost of living and exorbitant housing rates, he moved to [place].  He has 3 sons and 1 daughter, and 1 grandchild.  They all live in different parts of the [place] and no one is close by.  His primary caregiver is his wife    Current Meds:  aspirin EC, 81 mg, Oral, Daily  atorvastatin, 40 mg, Oral, QHS  furosemide, 160 mg, Intravenous, Once  hydrALAZINE, 25 mg, Oral, Q8H [facility]  insulin glargine, 10 Units, Subcutaneous, QAM   Or  insulin glargine, 5 Units, Subcutaneous, QAM  insulin lispro, 1-8 Units, Subcutaneous, Q4H SCH  isosorbide dinitrate, 10 mg, Oral, TID - Nitrate Free Interval  niacin, 500 mg, Oral, BID  senna-docusate, 1 tablet, Oral, Q12H [place]         Drips:   DOBUTamine 2.5 mcg/kg/min (12/22/22 0900)    heparin infusion 25,000 units/500 mL (VTE/Moderate Intensity) 18 Units/kg/hr (12/22/22 0900)       PMH:    Past Medical History:   Diagnosis Date    Acute CHF     NOV  & DEC 2018, - DIALYSIS CATH PLACED Aug 24 2017 TO REMOVE FLUID     Acute systolic (congestive) heart failure 08/2017    Anxiety     CAD (coronary artery disease)     Cardiomyopathy      nonischemic    CHF (congestive heart failure) 08/12/2014, 2013    Chronic obstructive pulmonary disease     POSSIBLE PER PT HE USES  SYNBICORT BID ABD  BREO INHALER PRN    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    Coronary artery disease 2011    CORONARY STENT PLACEMENT FOLLOWED BY [facility]    Depression     echocardiogram 02/2016, 11/2016, 09/2017, 12/20/2017    GERD (gastroesophageal reflux disease)     Heart attack 2011    and 2014    Hyperlipemia     Hypertension     ICD (implantable cardioverter-defibrillator) in  place     PLACED 2014  [facility]  LAST INTERROGATION April 01 2018 REPORT REQUESTED    Ischemic cardiomyopathy     EF 15% ON ECHO 09-20-2017 DR GARG IN EPIC    Nonischemic cardiomyopathy     NSTEMI (non-ST elevated myocardial infarction)     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    Pacemaker 2014    MEDTRONIC ICD/PACEMAKER COMBO LAST INTERROGATION  12-12 2018    Pneumonia 09/2016    Primary cardiomyopathy     Ischemic cardiomyopathy EF 15% ON ECHO 09-20-2017 DR GARG IN EPIC    Sepsis 08/2017    Syncope and collapse     PRIOR TO ICD/PACEMAKER PLACED 2014    Type 2 diabetes mellitus, controlled DX 1998    BS  AVG 100  A1C 7.4 Dec 27 2017    [place]     PT SEE HIS PMD April 01 2018 RE THIS-TOLD TO SEE PMD BY [facility] JUNE 12 WHEN THEY SAW HIM FOR A CL       PSH:    Past Surgical History:   Procedure Laterality Date    BIV  11/10/2016    ICD [place]  03/2010    CORONARY STENT PLACEMENT X 2 PER PT; LM normal, LAD 70-80% distal lesion at apex, 20% lesion mid CFX    CARDIAC CATHETERIZATION  12/2012    CARDIAC DEFIBRILLATOR PLACEMENT  12/2012    [facility]    CARDIAC PACEMAKER PLACEMENT  2014    MEDTRONIC ICD/PACEMAKER PLACED [facility]    CIRCUMCISION  AGE 30    COLONOSCOPY  2009    CORRECTION HAMMER TOE      duodenal ulcer  1973    STRESS RELATED IN SCHOOL    ECHOCARDIOGRAM, TRANSTHORACIC  02/2016 11/2016 09/2017 12/2017    ECHOCARDIOGRAM, TRANSTHORACIC  02/2016,11/2016,12/20/2017    EGD N/A  08/15/2014    Procedure: EGD;  Surgeon: Loraine Maple, MD;  Location: [facility];  Service: Gastroenterology;  Laterality: N/A;  egd w/ bx    EGD  1975    EGD, COLONOSCOPY N/A 04/04/2018    Procedure: EGD with bxs, COLONOSCOPY with polypectomy and clipping;  Surgeon: Omer Jack, MD;  Location: [facility];  Service: Gastroenterology;  Laterality: N/A;    FRACTURE SURGERY  AGE 77     RT  ANKLE- CAST APPLIED    HERNIA REPAIR  AGE 30    LEFT INGUINAL HERNIA    MPI nuclear study  06/2016    orthopedic surgery  AGE 3    RT foot corrective - [facility]    RIGHT HEART CATH Right 09/09/2022    Procedure: Right Heart Cath;  Surgeon: Wandra Arthurs, MD;  Location: [facility];  Service: Cardiovascular;  Laterality: Right;  tx from [place]       FH:  Family History   Problem Relation Age of Onset    Breast cancer Mother     Heart attack Mother 77    Hypertension Mother     Diabetes Mother     Coronary artery disease Mother     Diabetes Brother     Heart attack Father     Hypertension Father        Social History:  Social History     Socioeconomic History    Marital status: Married     Spouse name: Harle Battiest    Number  of children: 0    Years of education: Not on file    Highest education level: Not on file   Occupational History    Occupation: teacher [employer], history    Tobacco Use    Smoking status: Never    Smokeless tobacco: Never   Vaping Use    Vaping Use: Never used   Substance and Sexual Activity    Alcohol use: Not Currently    Drug use: No    Sexual activity: Yes     Partners: Female   Other Topics Concern    Not on file   Social History Narrative    Not on file     Social Determinants of Health     Financial Resource Strain: Not on file   Food Insecurity: No Food Insecurity (09/12/2022)    Hunger Vital Sign     Worried About Running Out of Food in the Last Year: Never true     Ran Out of Food in the Last Year: Never true   Transportation  Needs: No Transportation Needs (09/12/2022)    PRAPARE - Armed forces logistics/support/administrative officer (Medical): No     Lack of Transportation (Non-Medical): No   Physical Activity: Not on file   Stress: Not on file   Social Connections: Not on file   Intimate Partner Violence: Not At Risk (09/12/2022)    Humiliation, Afraid, Rape, and Kick questionnaire     Fear of Current or Ex-Partner: No     Emotionally Abused: No     Physically Abused: No     Sexually Abused: No   Housing Stability: Low Risk  (09/12/2022)    Housing Stability Vital Sign     Unable to Pay for Housing in the Last Year: No     Number of [place] in the Last Year: 1     Unstable Housing in the Last Year: No       Home medications:   Prior to Admission medications    Medication Sig Start Date End Date Taking? Authorizing Provider   apixaban (ELIQUIS) 5 MG Take 1 tablet (5 mg) by mouth every 12 (twelve) hours For 1 week followed by 1 tablet twice daily 09/14/22   Irma Newness, MD   aspirin EC 81 MG EC tablet Take 1 tablet (81 mg) by mouth daily    [provider]   atorvastatin (LIPITOR) 40 MG tablet Take 1 tablet (40 mg total) by mouth nightly 07/16/21   Carlis Abbott, MD   Calquence 100 MG Tab  04/07/22   [provider]   hydrALAZINE (APRESOLINE) 25 MG tablet Take 1 tablet (25 mg) by mouth every 8 (eight) hours 09/14/22 12/13/22  Irma Newness, MD   Insulin Glargine-yfgn (Semglee, yfgn,) 100 UNIT/ML Solution Pen-injector 50 Units by Subdermal route daily 10/26/22   Clearance Coots, MD   isosorbide dinitrate (ISORDIL) 10 MG tablet Take 1 tablet (10 mg) by mouth 3 (three) times daily 09/14/22 12/13/22  Irma Newness, MD   Jardiance 10 MG tablet TAKE 1 TABLET(10 MG) BY MOUTH DAILY 06/05/22   Clotilde Dieter, NP   loratadine (CLARITIN) 10 MG tablet Take 1 tablet (10 mg) by mouth daily 04/18/22   Regis Bill, MD   metoprolol succinate XL (TOPROL-XL) 25 MG 24 hr tablet Take 0.5 tablets (12.5 mg) by mouth daily 05/22/22   Clotilde Dieter, NP    niacin (NIASPAN) 500 MG CR tablet TAKE 1 TABLET BY MOUTH TWICE DAILY  Patient taking differently: 250 mg Taking half a tablet 02/23/22   Clearance Coots, MD   torsemide (DEMADEX) 20 MG tablet Take 2 tablets (40 mg) by mouth 2 (two) times daily 09/14/22   Irma Newness, MD        Inpatient/ER medications:  Medications Prior to Admission   Medication Sig    apixaban (ELIQUIS) 5 MG Take 1 tablet (5 mg) by mouth every 12 (twelve) hours For 1 week followed by 1 tablet twice daily    aspirin EC 81 MG EC tablet Take 1 tablet (81 mg) by mouth daily    atorvastatin (LIPITOR) 40 MG tablet Take 1 tablet (40 mg total) by mouth nightly    Calquence 100 MG Tab     hydrALAZINE (APRESOLINE) 25 MG tablet Take 1 tablet (25 mg) by mouth every 8 (eight) hours    Insulin Glargine-yfgn (Semglee, yfgn,) 100 UNIT/ML Solution Pen-injector 50 Units by Subdermal route daily    isosorbide dinitrate (ISORDIL) 10 MG tablet Take 1 tablet (10 mg) by mouth 3 (three) times daily    Jardiance 10 MG tablet TAKE 1 TABLET(10 MG) BY MOUTH DAILY    loratadine (CLARITIN) 10 MG tablet Take 1 tablet (10 mg) by mouth daily    metoprolol succinate XL (TOPROL-XL) 25 MG 24 hr tablet Take 0.5 tablets (12.5 mg) by mouth daily    niacin (NIASPAN) 500 MG CR tablet TAKE 1 TABLET BY MOUTH TWICE DAILY (Patient taking differently: 250 mg Taking half a tablet)    torsemide (DEMADEX) 20 MG tablet Take 2 tablets (40 mg) by mouth 2 (two) times daily       Current Inpatient :  Current Facility-Administered Medications   Medication Dose Route Frequency    aspirin EC  81 mg Oral Daily    atorvastatin  40 mg Oral QHS    furosemide  160 mg Intravenous Once    hydrALAZINE  25 mg Oral Q8H [place]    insulin glargine  10 Units Subcutaneous QAM    Or    insulin glargine  5 Units Subcutaneous QAM    insulin lispro  1-8 Units Subcutaneous Q4H [place]    isosorbide dinitrate  10 mg Oral TID - Nitrate Free Interval    niacin  500 mg Oral BID    senna-docusate  1 tablet Oral Q12H [place]       DOBUTamine 2.5 mcg/kg/min (12/22/22 0900)    heparin infusion 25,000 units/500 mL (VTE/Moderate Intensity) 18 Units/kg/hr (12/22/22 0900)       ROS:  No constitutional complaints, no respiratory complaints, no musculoskeletal complaints, or other pertinent review of symptoms except as noted in the history of present illness.    Physical Exam:  I/O last 3 completed shifts:  In: 454.4 [P.O.:400; I.V.:54.4]  Out: 1300 [Urine:1300]  Weight:   Wt Readings from Last 4 Encounters:   12/21/22 93.1 kg (205 lb 4 oz)   12/21/22 90.4 kg (199 lb 4.7 oz)   11/23/22 91.4 kg (201 lb 6.4 oz)   10/26/22 92.4 kg (203 lb 12.8 oz)     Vitals:    12/22/22 0900   BP: 92/54   Pulse: 79   Resp: 17   Temp:    SpO2: (!) 88%      General: in no distress.  Alert and oriented x 3  Eyes: No xanthelasma.  Oropharynx: No mucosal pallor.  Neck: Trachea midline, JVD elevated to 8cm H2O   Pulmonary: Crackles bilaterally.  Cardiovascular: PMI nondisplaced with normal rate  and rhythm, normal S1, normal S2, no murmur, rub, or gallop appreciated.  Abdomen: Soft, nontender, mildly distended  Extremities: 1+ edema, no clubbing, no cyanosis.  Skin: Cool to touch   Psych:  Mood was pleasant     Laboratory:      Labs:  CBC        12/21/22  2247 12/21/22  0416 12/20/22  2034   WBC 13.69* 13.49* 12.60*   Hgb 12.1* 13.9 14.6   Hematocrit 38.2 44.2 45.2   Platelets 110* 159 161     BMP        12/22/22  0424 12/21/22  2247 12/21/22  1837 12/20/22  2038 12/20/22  2034   Sodium 132* 135 135   < > 142   Potassium 4.9 4.9 4.8   < > 4.5   Chloride 101 101 103   < > 106   CO2 '19 17 19   '$ < > 17   BUN 62.0* 61.0* 60.0*   < > 48.0*   Creatinine 4.0* 4.2* 4.1*   < > 3.0*   Creatinine I-Stat  --   --   --    < >  --    eGFR 14.8* 13.9* 14.3*   < > 20.9*   Calcium 8.4 8.7 8.9   < > 10.1   Magnesium 2.8* 2.9* 2.8*   < > 2.9*   Phosphorus  --  7.2* 7.4*  --   --    Lactic Acid 2.1* 2.2* 1.8   < >  --    NT-proBNP  --  [id number]*  --   --  [id number]*    < > = values in this interval  not displayed.       LFTs        12/22/22  0424 12/21/22  2247 12/21/22  0416   AST (SGOT) 322* 426* 73*   ALT 409* 425* 73*   Alkaline Phosphatase 83 81 103   Bilirubin, Total 1.5* 1.8* 3.6*   Bilirubin Direct  --  0.9*  --    Bilirubin Indirect  --  0.9  --        Coags        12/22/22  0424 12/21/22  2247   PT  --  29.0*   PT INR  --  2.5*   PTT 33 32   Anti-Xa, UFH  --  1.21*       ABGs        Microbiology  Microbiology Results (last 15 days)       Procedure Component Value Units Date/Time    Respiratory Pathogen Panel w/COVID-19, PCR [id number]     Order Status: Canceled     COVID-19 (SARS-CoV-2) and Influenza A/B, NAA (Liat Rapid)- Admission [id number] Collected: 12/21/22 1050    Order Status: Completed Specimen: Culturette from Nasopharyngeal Updated: 12/21/22 1144     Purpose of COVID testing Diagnostic -[facility]I     SARS-CoV-2 Specimen Source Nasal Swab     SARS CoV 2 Overall Result Not Detected     Comment: __________________________________________________  -A result of "Detected" indicates POSITIVE for the    presence of SARS CoV-2 RNA  -A result of "Not Detected" indicates NEGATIVE for the    presence of SARS CoV-2 RNA  __________________________________________________________  Test performed using the Roche cobas Liat SARS-CoV-2 assay. This assay is  only for use under the Food and Drug Administration's Emergency Use  Authorization. This is a real-time RT-PCR assay for the qualitative  detection of SARS-CoV-2 RNA. Viral nucleic acids may persist in vivo,  independent of viability. Detection of viral nucleic acid does not imply the  presence of infectious virus, or that virus nucleic acid is the cause of  clinical symptoms. Negative results do not preclude SARS-CoV-2 infection and  should not be used as the sole basis for diagnosis, treatment or other  patient management decisions. Negative results must be combined with  clinical observations, patient history, and/or epidemiological information.  Invalid  results may be due to inhibiting substances in the specimen and  recollection should occur. Please see Fact Sheets for patients and providers  located:  https://www.benson-chung.com/          Influenza A Not Detected     Influenza B Not Detected     Comment: Test performed using the Roche cobas Liat SARS-CoV-2 & Influenza A/B assay.  This assay is only for use under the Food and Drug Administration's  Emergency Use Authorization. This is a multiplex real-time RT-PCR assay  intended for the simultaneous in vitro qualitative detection and  differentiation of SARS-CoV-2, influenza A, and influenza B virus RNA. Viral  nucleic acids may persist in vivo, independent of viability. Detection of  viral nucleic acid does not imply the presence of infectious virus, or that  virus nucleic acid is the cause of clinical symptoms. Negative results do  not preclude SARS-CoV-2, influenza A, and/or influenza B infection and  should not be used as the sole basis for diagnosis, treatment or other  patient management decisions. Negative results must be combined with  clinical observations, patient history, and/or epidemiological information.  Invalid results may be due to inhibiting substances in the specimen and  recollection should occur. Please see Fact Sheets for patients and providers  located: http://olson-hall.info/.         Narrative:      o Collect and clearly label specimen type:  o PREFERRED-Upper respiratory specimen: One Nasal Swab in  Transport Media.  o Hand deliver to laboratory ASAP  Diagnostic -[facility]I    Respiratory Pathogen Panel w/COVID-19, PCR [id number] Collected: 12/21/22 0106    Order Status: Canceled Specimen: Nasopharyngeal     Culture Blood Aerobic and Anaerobic [id number] Collected: 12/21/22 0021    Order Status: Completed Specimen: Blood, Venipuncture Updated: 12/22/22 0425    Narrative:      Indications:->Other  Other->infection  ORDER#: [id number]                                     ORDERED BY: LEE, SIEW MEI  SOURCE: Blood, Venipuncture RAC                      COLLECTED:  12/21/22 00:21  ANTIBIOTICS AT COLL.:                                RECEIVED :  12/21/22 04:06  Culture Blood Aerobic and Anaerobic        PRELIM      12/22/22 04:25  12/22/22   No Growth after 1 day/s of incubation.      Culture Blood Aerobic and Anaerobic [id number] Collected: 12/21/22 0021    Order Status: Completed Specimen: Blood, Venipuncture Updated: 12/22/22 0725    Narrative:      Indications:->Other  Other->infection  ORDER#: [id number]  ORDERED BY: LEE, SIEW MEI  SOURCE: Blood, Venipuncture LAC                      COLLECTED:  12/21/22 00:21  ANTIBIOTICS AT COLL.:                                RECEIVED :  12/21/22 04:06  Culture Blood Aerobic and Anaerobic        PRELIM      12/22/22 07:25   +  12/22/22   No Growth after 1 day/s of incubation.             Aerobic Blood Culture Positive in 24 to 48 hours             Gram Stain Shows: Gram positive cocci in clusters             Identification to follow      MRSA culture [id number] Collected: 12/21/22 0000    Order Status: Canceled Specimen: Culturette from Nasal Swab     MRSA culture [id number] Collected: 12/21/22 0000    Order Status: Canceled Specimen: Culturette from Throat

## 2022-12-22 NOTE — Plan of Care (Signed)
Problem: Compromised Moisture  Goal: Moisture level Interventions  Outcome: Progressing     Problem: Compromised Activity/Mobility  Goal: Activity/Mobility Interventions  Outcome: Progressing     Problem: Compromised Nutrition  Goal: Nutrition Interventions  Outcome: Progressing     Problem: Compromised Friction/Shear  Goal: Friction and Shear Interventions  Outcome: Progressing

## 2022-12-22 NOTE — Progress Notes (Addendum)
CATH LAB PROCEDURE HANDOFF REPORT    Date Time: 12/22/22 12:44 PM    INDICATIONS:    cardiomyopathy  POST PROCEDURE DEBRIEF:    Right heart cath, leave-in swan    Instrument/sponge/needle counts, if applicable:  N/A  Specimen labeling, read labels aloud, including name:  N/A  Any applicable equipment problems to be addressed:  N/A    Per MD:  Key concerns for recovery/management: N/A  ALLERGIES:    Allopurinol   ACC BLEEDING RISK SCORE       MEDICAL HISTORY:      Past Medical History:   Diagnosis Date    Acute CHF     NOV  & DEC 2018, - DIALYSIS CATH PLACED Aug 24 2017 TO REMOVE FLUID     Acute systolic (congestive) heart failure 08/2017    Anxiety     CAD (coronary artery disease)     Cardiomyopathy     nonischemic    CHF (congestive heart failure) 08/12/2014, 2013    Chronic obstructive pulmonary disease     POSSIBLE PER PT HE USES  SYNBICORT BID ABD  BREO INHALER PRN    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    Coronary artery disease 2011    CORONARY STENT PLACEMENT FOLLOWED BY [facility]    Depression     echocardiogram 02/2016, 11/2016, 09/2017, 12/20/2017    GERD (gastroesophageal reflux disease)     Heart attack 2011    and 2014    Hyperlipemia     Hypertension     ICD (implantable 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     PLACED 2014  [facility]  LAST INTERROGATION April 01 2018 REPORT REQUESTED    Ischemic cardiomyopathy     EF 15% ON ECHO 09-20-2017 DR GARG IN EPIC    Nonischemic cardiomyopathy     NSTEMI (non-ST elevated myocardial infarction)     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    Pacemaker 2014    [employer] ICD/PACEMAKER COMBO LAST INTERROGATION  12-12 2018    Pneumonia 09/2016    Primary cardiomyopathy     Ischemic cardiomyopathy EF 15% ON ECHO 09-20-2017 DR GARG IN EPIC    Sepsis 08/2017    Syncope and collapse     PRIOR TO ICD/PACEMAKER PLACED 2014    Type 2 diabetes mellitus, controlled DX 1998    BS  AVG 100  A1C 7.4 Dec 27 2017    [facility]     PT SEE HIS PMD April 01 2018 RE THIS-TOLD TO SEE PMD BY [facility] JUNE 12  WHEN THEY SAW HIM FOR A CL      ACCESS:    76F sheath in right internal jugular vein  Swan left in at 58cm  CHG dressing and sutured into place  MEDICATIONS:    Lidocaine subQ  Versed: 1 mg IV  Fentanyl: 25 mcg IV  VITALS:    HR: 80           Rhythm: sinus rhythm       BP: 107/64   O2 SAT: 99%      on 3L O2  PROCEDURE DETAILS:    Outcomes: See physician note                                Last ACT: No results found for: "ISTATACTKAOL"       Final Chest Pain Assessment::0/10    Report given to:  CICU RN    See  Physicians Op note/ Report for details    SAFETY CHECKLIST   I, Margarita Sermons, along with Charna Elizabeth, have validated the following safety critical items:    Patient ID band present  IV is patent  pump meds and rates correct and current as documented in the [facility]  Patient transported on oxygen   with adequate capacity within the cylinder, and  regulator and valve in the ON position, and   non-rebreather mask reservoir full, if applicable  Sites dressed, sheath(s) capped with occlusive caps  Patient transported on monitor  EKG attached  Pulse oximetry attached  Swan-Ganz catheter present  PA waveform monitored  Balloon deflated  Syringe locked empty  Last vital signs stable and documented

## 2022-12-22 NOTE — Progress Notes (Signed)
Pharmacy Vancomycin Dosing Consult  Patient is on vancomycin day 1 for PNA    Current Vancomycin Dose:  Target Guidance:  15-20 mg/mL  Level Due: 3/6 at 2200    Other anti-infectives:   Patient Metrics & Relevant Labs:  Estimated Creatinine Clearance: 20.3 mL/min (A) (based on SCr of 3.4 mg/dL (H)).  Recent Labs     12/22/22  1505 12/22/22  0424 12/21/22  2247 12/21/22  1050 12/21/22  0416 12/20/22  2038 12/20/22  2034   WBC  --   --  13.69*  --  13.49*  --  12.60*   BUN 59.0* 62.0* 61.0*   < > 51.0*  --  48.0*   Creatinine 3.4* 4.0* 4.2*   < > 3.3*  --  3.0*   Creatinine I-Stat  --   --   --   --   --    < >  --     < > = values in this interval not displayed.     Wt Readings from Last 1 Encounters:   12/21/22 93.1 kg (205 lb 4 oz)       Relevant Microbiology:  Blood cx collected    Levels:   No results for input(s): "VANCO" in the last 72 hours.    Recommendations:  Scr elevated, baseline serum creatinine about 2.5 mg/dL. Will dose per levels given AKI on CKD.   Recommend vancomycin 1750 mg ('20mg'$ /kg) loading dose once.  24hr vancomycin level 3/6 at 2200    Thank you for consulting pharmacy to dose Vancomycin for this patient.  We will continue to monitor, obtain levels, and adjust dosing as indicated for the duration of this consult.    Marcella Dubs, PharmD, BCPS  December 22, 2022

## 2022-12-22 NOTE — Progress Notes (Signed)
Patient's Name: [name] [name]   Admit Date: 12/21/2022  Medical Record Number: [id number]   Code Status: full code  Room: FI102/FI102-01  Date/Time: 12/23/22 7:30 AM  Cardiologist of record: [facility] (Dr. Judeth Horn)    INTERVAL EVENTS:  RHC (12/22/22): RA 15, RV 48/1. PA 48/21 (mean of 28), PCWP 17, FickCO 4.2 L/min FickCI 2.0 L/min/m2, ThermCO 2.8 L/min  ThermCI 1.3 L/min/m2, PVR 2.1 wu (Fick) 3.1 wu (Therm)  Aspirated overnight while somnolent post catheterization   Vitals: Afebrile, HR high 70s to low 80s, RR 16-21, BP 100s/60s, 99% on HFNC  Telemetry reviewed: 2 runs of NSVT (4-5 and  beats)  I/O: -1.9L     PA Catheter: RA 20, PA 51/24 (33), PCWP 24, CI/CO Fick 2.24/4.88, PVR 1.88, SVR, PAPI 1.35, CPO 0.85   Labs: Hgb 10.8 (12.1), Platelets 81 (110, 159, 161), BUN 59 (61, 62), Creatinine 3.1 (3.3, 4.0), Sodium 133, Lactic acid 1.4 (1.3), AST 129 (165), ALT 304 (342)  Imaging/Diagnostics: CXR with Airspace disease in the medial right lower lobe.  Microbiology: None overnight   Current Infusions: dobutamine 5 mcg/kg/min, amiodarone 0.5 mcg/kg/min   Subjectively, had a restless night due to multiple disturbances. Denies shortness of breath, nausea, emesis, chest pain, tightness, or cough.     IMPRESSIONS   Heart failure-cardiogenic shock, SCAI stage C  Acute on chronic decompensated heart failure, LVEF less than 15%  Ischemic cardiomyopathy, NYHA class IV, ACC/AHA stage D, currently undergoing DT LVAD evaluation  RHC 09/09/22: RA 7, RV 39/4, PA 41/21/29, PCWP 14, PA sat 62%, RA sat 59%, AO sat 95%, Fick CO/CI 4.0/2.0, TD CO/CI 3.5/1.7, MAP 89, PAPi 3, PVR 3.75-4.3 WU  TTE 12/21/22: Severely reduced LV function with EF 11%, LVIDD 6.8 cm, global hypokinesis, mildly dilated RV with moderately decreased RV systolic function, severe functional MR, severe tricuspid regurgitation with RVSP 51 mmHg  RHC (12/22/22): RA 15, RV 48/1. 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  Severe tricuspid regurgitation  Hyperlipidemia  Hypertension  AKI on CKD stage IV with baseline creatinine 1.6 as of 03/2022  Stage IV CLL, diagnosed in September 2021, 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  Right lower extremity DVT (5/23)   Type 2 diabetes with an A1c 5.9%  Stable COPD  Multiple hospital admissions for acute decompensated heart failure in 2023  DM2  Thrombocytopenia  HIT panel pending  Iron deficit anemia   Iron studies (3/6): Iron sat 10, Ferritin 57  Blood cultures positive in 1 of 2 bottles for CNS     RECOMMENDATIONS  Heart failure-cardiogenic shock, SCAI stage C  Acute on chronic decompensated heart failure, LVEF less than 15%  Preload: Lasix '160mg'$  IV BID for FG of -2 L  Inotrope: C/ dobutamine 5 mcg/kg/min   Afterload: hydralazine 25 mg 3 times daily and Isordil 10 mg 3 times daily for afterload reduction   MCS: If he decompensates, would consider IABP as a bridge to DT LVAD   Advanced therapies candidacy:Currently being evaluated for DT LVAD. Concern for kidney function but it is improving with diuresis and closer to his baseline creatinine in 03/2022 was 1.5   In case of deterioration:  IABP as above  GDMT for Heart failure  Beta blocker: Hold in setting of cardiogenic shock  ACE/ARB/ARNI: Hold in setting of worsening renal function  MRA: Hold in setting of worsening renal function.  Previously has had hyperkalemia issues  SGLT2i: Hold for n.p.o., procedures, and reduced renal function    Iron deficit anemia   Iron studies (3/6): Iron sat 10, Ferritin 57  IV iron repletion (12/23/22-12/28/22)    Right lower extremity DVT  Likely provoked in the setting of cancer  C/ heparin infusion pending HIT panel    Rest of care per [facility]      Toya Smothers, MD  Cardiology Fellow, PGY4   12/23/22 7:30 AM          Reason for Consult: AHF-CS    History:  77 y.o. 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  He was given a dose of adenosine and had an unsuccessful cardioversion with 120 J.  Adenosine eventually converted to sinus rhythm and telemetry showed a sensed V paced rhythms.    Admitted to ICU at [facility] for further management. However, then transferred to [place] on new Dobutamine 2.5 mcg/kg/min for LVAD consideration.      Of note follows with  heart cardiology. Has been followed by Advanced Heart Failure for advanced therapies, including LVAD where he has been seen by Dr. Alain Marion. Home medications include Eliquis 2.5 mg BID, ASA 81 mg QD, Lipitor 40 mg QHS, Jardiance 10 mg QD, Hydralazine 25 mg Q8H, Isosorbide Dinitrate 10 mg TID, metoprolol succinate XL 12. 5 mg QD, torsemide 40 mg BID, Lantus 50U QD. Was previously on Aldactone but discontinued given renal function. Dry weight is around 185 lb per 10/2022, weight today around 195-199 lb.     Follows with Dr. Mervin Kung of [facility] for CLL. Dx'd in 06/2020. Thought to be well controlled on acalabrutinib.     Follows with [facility] for CKD. Cr increased from 1.5 in 03/2022 to 2.3 in 09/2022. Previously on HD/UF in 2018.     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  He lives with his wife in [place], previously was living in [place] but due to high cost of living and exorbitant housing rates, he moved to [place].  He has 3 sons and 1 daughter, and 1 grandchild.  They all live in different parts of the [place]  and no one is close by.  His primary caregiver is his wife    Current Meds:  aspirin EC, 81 mg, Oral, Daily  atorvastatin, 40 mg, Oral, QHS  hydrALAZINE, 25 mg, Oral, Q8H [facility]  insulin glargine, 10 Units, Subcutaneous, QAM   Or  insulin glargine, 5 Units, Subcutaneous, QAM  insulin lispro, 1-8 Units, Subcutaneous, Q4H [facility]  isosorbide dinitrate, 10 mg, Oral, TID - Nitrate Free Interval  niacin, 500 mg, Oral, BID  piperacillin-tazobactam, 4.5 g, Intravenous, Q8H  senna-docusate, 1 tablet, Oral, Q12H [facility]  vancomycin, , Intravenous, See Admin Instructions         Drips:   amiodarone 0.5 mg/min (12/23/22 0700)    DOBUTamine 5 mcg/kg/min (12/23/22 0700)    heparin infusion 25,000  units/500 mL (VTE/Moderate Intensity) 15 Units/kg/hr (12/23/22 0700)       PMH:    Past Medical History:   Diagnosis Date    Acute CHF     NOV  & DEC 2018, - DIALYSIS CATH PLACED Aug 24 2017 TO REMOVE FLUID     Acute systolic (congestive) heart failure 08/2017    Anxiety     CAD (coronary artery disease)     Cardiomyopathy     nonischemic    CHF (congestive heart failure) 08/12/2014, 2013    Chronic obstructive pulmonary disease     POSSIBLE PER PT HE USES  SYNBICORT BID ABD  BREO INHALER PRN    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    Coronary artery disease 2011    CORONARY STENT PLACEMENT FOLLOWED BY [facility]    Depression     echocardiogram 02/2016, 11/2016, 09/2017, 12/20/2017    GERD (gastroesophageal reflux disease)     Heart attack 2011    and 2014    Hyperlipemia     Hypertension     ICD (implantable 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     PLACED 2014  [facility]  LAST INTERROGATION April 01 2018 REPORT REQUESTED    Ischemic cardiomyopathy     EF 15% ON ECHO 09-20-2017 DR GARG IN EPIC    Nonischemic cardiomyopathy     NSTEMI (non-ST elevated myocardial infarction)     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    Pacemaker 2014    MEDTRONIC ICD/PACEMAKER COMBO LAST INTERROGATION  12-12 2018    Pneumonia 09/2016    Primary cardiomyopathy     Ischemic  cardiomyopathy EF 15% ON ECHO 09-20-2017 DR GARG IN EPIC    Sepsis 08/2017    Syncope and collapse     PRIOR TO ICD/PACEMAKER PLACED 2014    Type 2 diabetes mellitus, controlled DX 1998    BS  AVG 100  A1C 7.4 Dec 27 2017    [place]     PT SEE HIS PMD April 01 2018 RE THIS-TOLD TO SEE PMD BY [facility] JUNE 12 WHEN THEY SAW HIM FOR A CL       PSH:    Past Surgical History:   Procedure Laterality Date    BIV  11/10/2016    ICD METRONIC  UPGRADE     CARDIAC CATHETERIZATION  03/2010    CORONARY STENT PLACEMENT X 2 PER PT; LM normal, LAD 70-80% distal lesion at apex, 20% lesion mid CFX    CARDIAC CATHETERIZATION  12/2012    CARDIAC DEFIBRILLATOR PLACEMENT  12/2012    [facility]    CARDIAC PACEMAKER PLACEMENT  2014    MEDTRONIC ICD/PACEMAKER PLACED [facility]    CIRCUMCISION  AGE 32    COLONOSCOPY  2009    CORRECTION HAMMER TOE      duodenal ulcer  1973    STRESS RELATED IN SCHOOL    ECHOCARDIOGRAM, TRANSTHORACIC  02/2016 11/2016 09/2017 12/2017    ECHOCARDIOGRAM, TRANSTHORACIC  02/2016,11/2016,12/20/2017    EGD N/A 08/15/2014    Procedure: EGD;  Surgeon: Loraine Maple, MD;  Location: [facility];  Service: Gastroenterology;  Laterality: N/A;  egd w/ bx    EGD  1975    EGD, COLONOSCOPY N/A 04/04/2018    Procedure: EGD with bxs, COLONOSCOPY with polypectomy and clipping;  Surgeon: Omer Jack, MD;  Location: [facility];  Service: Gastroenterology;  Laterality: N/A;    FRACTURE SURGERY  AGE 41     RT  ANKLE-  CAST APPLIED    HERNIA REPAIR  AGE 13    LEFT INGUINAL HERNIA    MPI nuclear study  06/2016    orthopedic surgery  AGE 71    RT foot corrective - HAMMERTOE    RIGHT HEART CATH Right 09/09/2022    Procedure: Right Heart Cath;  Surgeon: Wandra Arthurs, MD;  Location: [facility];  Service: Cardiovascular;  Laterality: Right;  tx from [place]       FH:  Family History   Problem Relation Age of Onset    Breast cancer Mother     Heart attack Mother 23     Hypertension Mother     Diabetes Mother     Coronary artery disease Mother     Diabetes Brother     Heart attack Father     Hypertension Father        Social History:  Social History     Socioeconomic History    Marital status: Married     Spouse name: Architect    Number of children: 0    Years of education: Not on file    Highest education level: Not on file   Occupational History    Occupation: Pharmacist, 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[employer], history    Tobacco Use    Smoking status: Never    Smokeless tobacco: Never   Vaping Use    Vaping Use: Never used   Substance and Sexual Activity    Alcohol use: Not Currently    Drug use: No    Sexual activity: Yes     Partners: Female   Other Topics Concern    Not on file   Social History Narrative    Not on file     Social Determinants of Health     Financial Resource Strain: Low Risk  (12/22/2022)    Overall Financial Resource Strain (CARDIA)     Difficulty of Paying Living Expenses: Not hard at all   Food Insecurity: No Food Insecurity (09/12/2022)    Hunger Vital Sign     Worried About Running Out of Food in the Last Year: Never true     [place] in the Last Year: Never true   Transportation Needs: No Transportation Needs (12/22/2022)    PRAPARE - Armed forces logistics/support/administrative officer (Medical): No     Lack of Transportation (Non-Medical): No   Physical Activity: Not on file   Stress: Not on file   Social Connections: Not on file   Intimate Partner Violence: Not At Risk (09/12/2022)    Humiliation, Afraid, Rape, and Kick questionnaire     Fear of Current or Ex-Partner: No     Emotionally Abused: No     Physically Abused: No     Sexually Abused: No   Housing Stability: Low Risk  (12/22/2022)    Housing Stability Vital Sign     Unable to Pay for Housing in the Last Year: No     Number of [place] in the Last Year: 1     Unstable Housing in the Last Year: No       Home medications:   Prior to Admission medications    Medication Sig Start Date End Date Taking? Authorizing Provider   apixaban  (ELIQUIS) 5 MG Take 1 tablet (5 mg) by mouth every 12 (twelve) hours For 1 week followed by 1 tablet twice daily 09/14/22   Irma Newness, MD  aspirin EC 81 MG EC tablet Take 1 tablet (81 mg) by mouth daily    [provider]   atorvastatin (LIPITOR) 40 MG tablet Take 1 tablet (40 mg total) by mouth nightly 07/16/21   Carlis Abbott, MD   Calquence 100 MG Tab  04/07/22   [provider]   hydrALAZINE (APRESOLINE) 25 MG tablet Take 1 tablet (25 mg) by mouth every 8 (eight) hours 09/14/22 12/13/22  Irma Newness, MD   Insulin Glargine-yfgn (Semglee, yfgn,) 100 UNIT/ML Solution Pen-injector 50 Units by Subdermal route daily 10/26/22   Clearance Coots, MD   isosorbide dinitrate (ISORDIL) 10 MG tablet Take 1 tablet (10 mg) by mouth 3 (three) times daily 09/14/22 12/13/22  Irma Newness, MD   Jardiance 10 MG tablet TAKE 1 TABLET(10 MG) BY MOUTH DAILY 06/05/22   Clotilde Dieter, NP   loratadine (CLARITIN) 10 MG tablet Take 1 tablet (10 mg) by mouth daily 04/18/22   Regis Bill, MD   metoprolol succinate XL (TOPROL-XL) 25 MG 24 hr tablet Take 0.5 tablets (12.5 mg) by mouth daily 05/22/22   Clotilde Dieter, NP   niacin (NIASPAN) 500 MG CR tablet TAKE 1 TABLET BY MOUTH TWICE DAILY  Patient taking differently: 250 mg Taking half a tablet 02/23/22   Clearance Coots, MD   torsemide (DEMADEX) 20 MG tablet Take 2 tablets (40 mg) by mouth 2 (two) times daily 09/14/22   Irma Newness, MD        Inpatient/ER medications:  Medications Prior to Admission   Medication Sig    apixaban (ELIQUIS) 5 MG Take 1 tablet (5 mg) by mouth every 12 (twelve) hours For 1 week followed by 1 tablet twice daily    aspirin EC 81 MG EC tablet Take 1 tablet (81 mg) by mouth daily    atorvastatin (LIPITOR) 40 MG tablet Take 1 tablet (40 mg total) by mouth nightly    Calquence 100 MG Tab     hydrALAZINE (APRESOLINE) 25 MG tablet Take 1 tablet (25 mg) by mouth every 8 (eight) hours    Insulin Glargine-yfgn (Semglee, yfgn,) 100 UNIT/ML Solution  Pen-injector 50 Units by Subdermal route daily    isosorbide dinitrate (ISORDIL) 10 MG tablet Take 1 tablet (10 mg) by mouth 3 (three) times daily    Jardiance 10 MG tablet TAKE 1 TABLET(10 MG) BY MOUTH DAILY    loratadine (CLARITIN) 10 MG tablet Take 1 tablet (10 mg) by mouth daily    metoprolol succinate XL (TOPROL-XL) 25 MG 24 hr tablet Take 0.5 tablets (12.5 mg) by mouth daily    niacin (NIASPAN) 500 MG CR tablet TAKE 1 TABLET BY MOUTH TWICE DAILY (Patient taking differently: 250 mg Taking half a tablet)    torsemide (DEMADEX) 20 MG tablet Take 2 tablets (40 mg) by mouth 2 (two) times daily       Current Inpatient :  Current Facility-Administered Medications   Medication Dose Route Frequency    aspirin EC  81 mg Oral Daily    atorvastatin  40 mg Oral QHS    hydrALAZINE  25 mg Oral Q8H [place]    insulin glargine  10 Units Subcutaneous QAM    Or    insulin glargine  5 Units Subcutaneous QAM    insulin lispro  1-8 Units Subcutaneous Q4H [place]    isosorbide dinitrate  10 mg Oral TID - Nitrate Free Interval    niacin  500 mg Oral BID    piperacillin-tazobactam  4.5 g Intravenous Q8H  senna-docusate  1 tablet Oral Q12H [place]    vancomycin   Intravenous See Admin Instructions      amiodarone 0.5 mg/min (12/23/22 0700)    DOBUTamine 5 mcg/kg/min (12/23/22 0700)    heparin infusion 25,000 units/500 mL (VTE/Moderate Intensity) 15 Units/kg/hr (12/23/22 0700)       ROS:  No constitutional complaints, no respiratory complaints, no musculoskeletal complaints, or other pertinent review of symptoms except as noted in the history of present illness.    Physical Exam:  I/O last 3 completed shifts:  In: 3020.41 [P.O.:1030; I.V.:1312.91; IV Piggyback:677.5]  Out: 5750 [Urine:5750]  Weight:   Wt Readings from Last 4 Encounters:   12/21/22 93.1 kg (205 lb 4 oz)   12/21/22 90.4 kg (199 lb 4.7 oz)   11/23/22 91.4 kg (201 lb 6.4 oz)   10/26/22 92.4 kg (203 lb 12.8 oz)     Vitals:    12/23/22 0700   BP: 108/66   Pulse: 80   Resp: 21   Temp:     SpO2: 99%      General: in no distress.  Alert and oriented x 3  Eyes: No xanthelasma.  Oropharynx: No mucosal pallor.  Neck: Trachea midline, JVD elevated to 8cm H2O. R IJ PAC    Pulmonary: Crackles bilaterally.  Cardiovascular: PMI nondisplaced with normal rate and rhythm, normal S1, normal S2, no murmur, rub, or gallop appreciated.  Abdomen: Soft, nontender, mildly distended  Extremities: 1+ edema, no clubbing, no cyanosis.  Skin: Cool to touch   Psych:  Mood was pleasant     Laboratory:      Labs:  CBC        12/23/22  0334 12/21/22  2247 12/21/22  0416   WBC 7.18 13.69* 13.49*   Hgb 10.8* 12.1* 13.9   Hematocrit 33.5* 38.2 44.2   Platelets 81* 110* 159     BMP        12/23/22  0334 12/22/22  2133 12/22/22  1505 12/22/22  1504 12/22/22  0424 12/21/22  2247 12/21/22  1837 12/20/22  2038 12/20/22  2034   Sodium 133* 134* 137  --  132* 135 135   < > 142   Potassium 3.9 4.1 3.8  --  4.9 4.9 4.8   < > 4.5   Chloride 100 99 101  --  101 101 103   < > 106   CO2 '24 25 23  '$ --  '19 17 19   '$ < > 17   BUN 59.0* 61.0* 59.0*  --  62.0* 61.0* 60.0*   < > 48.0*   Creatinine 3.1* 3.3* 3.4*  --  4.0* 4.2* 4.1*   < > 3.0*   Creatinine I-Stat  --   --   --   --   --   --   --    < >  --    eGFR 20.1* 18.6* 18.0*  --  14.8* 13.9* 14.3*   < > 20.9*   Calcium 8.0 8.2 8.8  --  8.4 8.7 8.9   < > 10.1   Magnesium 2.4  --  2.6  --  2.8* 2.9* 2.8*   < > 2.9*   Phosphorus  --   --   --   --   --  7.2* 7.4*  --   --    Lactic Acid 1.4 1.3  --  1.0 2.1* 2.2* 1.8   < >  --    NT-proBNP  --   --   --   --   --  [id number]*  --   --  [id number]*    < > = values in this interval not displayed.       LFTs        12/23/22  0334 12/22/22  2133 12/22/22  1505 12/22/22  0424 12/21/22  2247   AST (SGOT) 129* 165* 205*   < > 426*   ALT 304* 342* 374*   < > 425*   Alkaline Phosphatase 87 81 82   < > 81   Bilirubin, Total 1.2 1.5* 1.8*   < > 1.8*   Bilirubin Direct  --   --   --   --  0.9*   Bilirubin Indirect  --   --   --   --  0.9    < > = values in this  interval not displayed.       Coags        12/23/22  0334 12/22/22  2133 12/22/22  1430 12/22/22  0424 12/21/22  2247   PT  --   --   --   --  29.0*   PT INR  --   --   --   --  2.5*   PTT 68* 70* 125*   < > 32   Anti-Xa, UFH  --   --   --   --  1.21*    < > = values in this interval not displayed.       ABGs        Microbiology  Microbiology Results (last 15 days)       Procedure Component Value Units Date/Time    Culture Blood Aerobic and Anaerobic [id number] Collected: 12/22/22 2239    Order Status: Sent Specimen: Blood, Venipuncture Updated: 12/23/22 0200    Narrative:      The order will result in two separate 8-3m bottles  Please do NOT order repeat blood cultures if one has been  drawn within the last 48 hours  UNLESS concerned for  endocarditis  AVOID BLOOD CULTURE DRAWS FROM CENTRAL LINE IF POSSIBLE  Indications:->Pneumonia  1 BLUE+1 PURPLE    Culture Blood Aerobic and Anaerobic [[id number]: 12/22/22 2239    Order Status: Sent Specimen: Blood, Venipuncture Updated: 12/23/22 0159    Narrative:      The order will result in two separate 8-116mbottles  Please do NOT order repeat blood cultures if one has been  drawn within the last 48 hours  UNLESS concerned for  endocarditis  AVOID BLOOD CULTURE DRAWS FROM CENTRAL LINE IF POSSIBLE  Indications:->Pneumonia  1 BLUE+1 PURPLE    Respiratory Pathogen Panel w/COVID-19, PCR [[id number]   Order Status: Canceled     COVID-19 (SARS-CoV-2) and Influenza A/B, NAA (Liat Rapid)- Admission [[id number]: 12/21/22 1050    Order Status: Completed Specimen: Culturette from Nasopharyngeal Updated: 12/21/22 1144     Purpose of COVID testing Diagnostic -PUI     SARS-CoV-2 Specimen Source Nasal Swab     SARS CoV 2 Overall Result Not Detected     Comment: __________________________________________________  -A result of "Detected" indicates POSITIVE for the    presence of SARS CoV-2 RNA  -A result of "Not Detected" indicates NEGATIVE for the    presence of  SARS CoV-2 RNA  __________________________________________________________  Test performed using the Roche cobas Liat SARS-CoV-2 assay. This assay is  only for use under the Food and Drug Administration's Emergency Use  Authorization. This is a real-time RT-PCR assay for the qualitative  detection of SARS-CoV-2 RNA. Viral nucleic acids may persist in vivo,  independent of viability. Detection of viral nucleic acid does not imply the  presence of infectious virus, or that virus nucleic acid is the cause of  clinical symptoms. Negative results do not preclude SARS-CoV-2 infection and  should not be used as the sole basis for diagnosis, treatment or other  patient management decisions. Negative results must be combined with  clinical observations, patient history, and/or epidemiological information.  Invalid results may be due to inhibiting substances in the specimen and  recollection should occur. Please see Fact Sheets for patients and providers  located:  https://www.benson-chung.com/          Influenza A Not Detected     Influenza B Not Detected     Comment: Test performed using the Roche cobas Liat SARS-CoV-2 & Influenza A/B assay.  This assay is only for use under the Food and Drug Administration's  Emergency Use Authorization. This is a multiplex real-time RT-PCR assay  intended for the simultaneous in vitro qualitative detection and  differentiation of SARS-CoV-2, influenza A, and influenza B virus RNA. Viral  nucleic acids may persist in vivo, independent of viability. Detection of  viral nucleic acid does not imply the presence of infectious virus, or that  virus nucleic acid is the cause of clinical symptoms. Negative results do  not preclude SARS-CoV-2, influenza A, and/or influenza B infection and  should not be used as the sole basis for diagnosis, treatment or other  patient management decisions. Negative results must be combined with  clinical observations, patient history, and/or  epidemiological information.  Invalid results may be due to inhibiting substances in the specimen and  recollection should occur. Please see Fact Sheets for patients and providers  located: http://olson-hall.info/.         Narrative:      o Collect and clearly label specimen type:  o PREFERRED-Upper respiratory specimen: One Nasal Swab in  Transport Media.  o Hand deliver to laboratory ASAP  Diagnostic -[facility]I    Respiratory Pathogen Panel w/COVID-19, PCR [id number] Collected: 12/21/22 0106    Order Status: Canceled Specimen: Nasopharyngeal     Culture Blood Aerobic and Anaerobic [id number] Collected: 12/21/22 0021    Order Status: Completed Specimen: Blood, Venipuncture Updated: 12/23/22 0426    Narrative:      Indications:->Other  Other->infection  ORDER#: [id number]                                    ORDERED BY: LEE, SIEW MEI  SOURCE: Blood, Venipuncture RAC                      COLLECTED:  12/21/22 00:21  ANTIBIOTICS AT COLL.:                                RECEIVED :  12/21/22 04:06  Culture Blood Aerobic and Anaerobic        PRELIM      12/23/22 04:26  12/22/22   No Growth after 1 day/s of incubation.  12/23/22   No Growth after 2 day/s of incubation.      Culture Blood Aerobic and Anaerobic [id number] Collected: 12/21/22 0021    Order Status: Completed Specimen: Blood, Venipuncture Updated: 12/22/22 0725    Narrative:      Indications:->Other  Other->infection  ORDER#: [id number]                                    ORDERED BY: LEE, SIEW MEI  SOURCE: Blood, Venipuncture LAC                      COLLECTED:  12/21/22 00:21  ANTIBIOTICS AT COLL.:                                RECEIVED :  12/21/22 04:06  Culture Blood Aerobic and Anaerobic        PRELIM      12/22/22 07:25   +  12/22/22   No Growth after 1 day/s of incubation.             Aerobic Blood Culture Positive in 24 to 48 hours             Gram Stain Shows: Gram positive cocci in clusters             Identification to follow      MRSA culture  [id number] Collected: 12/21/22 0000    Order Status: Canceled Specimen: Culturette from Nasal Swab     MRSA culture [id number] Collected: 12/21/22 0000    Order Status: Canceled Specimen: Culturette from Throat

## 2022-12-22 NOTE — Nursing Progress Note (Signed)
4 eyes in 4 hours pressure injury assessment note:      Completed with: Lauren RN    Unit & Time admitted: CICU @ 2200             Bony Prominences: Check appropriate box; if wound is present enter wound assessment in LDA     Occiput:                 '[x]'$ WNL  '[]'$  Wound present  Face:                     '[x]'$ WNL  '[]'$  Wound present  Ears:                      '[x]'$ WNL  '[]'$  Wound present  Spine:                    '[x]'$ WNL  '[]'$  Wound present  Shoulders:             '[x]'$ WNL  '[]'$  Wound present  Elbows:                  '[x]'$ WNL  '[]'$  Wound present  Sacrum/coccyx:     '[x]'$ WNL  '[]'$  Wound present  Ischial Tuberosity:  '[x]'$ WNL  '[]'$  Wound present  Trochanter/Hip:      '[x]'$ WNL  '[]'$  Wound present  Knees:                   '[x]'$ WNL  '[]'$  Wound present  Ankles:                   '[x]'$ WNL  '[]'$  Wound present  Heels:                    '[x]'$ WNL  '[]'$  Wound present  Other pressure areas:  '[]'$  Wound location       Device related: '[]'$  Device name:         LDA completed if wound present: yes/no  Consult WOCN if necessary    Other skin related issues, ie tears, rash, etc, document in Integumentary flowsheet

## 2022-12-22 NOTE — Progress Notes (Signed)
[facility].  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  -CAD with past PCI   -Mild hyponatremia due to hypervolemia   -Non gap acidosis due to CKD     Plan:    -No need for RRT at this time   -Recommend diuresis but deter to heart failure team   -supportive care   -please try to avoid contrast studies and nephrotoxins   -daily labs     [name], MD  Epic Secure Chat: direct or group "[facility]  [place]  Office - [phone]  ++++++++++++++++++++++++++++++++++++++++++++++++++++++++++++++  Chief Complaint:    Interval History:    Medications:  Scheduled Meds:  Current Facility-Administered Medications   Medication Dose Route Frequency    aspirin EC  81 mg Oral Daily    atorvastatin  40 mg Oral QHS    furosemide  160 mg Intravenous Once    hydrALAZINE  25 mg Oral Q8H [place]    insulin glargine  10 Units Subcutaneous QAM    Or    insulin glargine  5 Units Subcutaneous QAM    insulin lispro  1-8 Units Subcutaneous Q4H [place]    isosorbide dinitrate  10 mg Oral TID - Nitrate Free Interval    niacin  500 mg Oral BID    senna-docusate  1 tablet Oral Q12H [place]     Continuous Infusions:   DOBUTamine 2.5 mcg/kg/min (12/22/22 0900)    heparin infusion 25,000 units/500 mL (VTE/Moderate Intensity) 18 Units/kg/hr (12/22/22 0900)     PRN Meds:acetaminophen, dextrose **OR** dextrose **OR** dextrose **OR** glucagon (rDNA), heparin (porcine), heparin (porcine)    Objective:  Vital signs in last 24 hours:  Temp:  [96.2 F (35.7 C)-98.8 F (37.1 C)] 98 F (36.7 C)  Heart Rate:  [79-94] 79  Resp Rate:  [4-38] 17  BP: (92-113)/(54-88) 92/54  Intake/Output last 24  hours:    Intake/Output Summary (Last 24 hours) at 12/22/2022 0947  Last data filed at 12/22/2022 0900  Gross per 24 hour   Intake 528.81 ml   Output 1800 ml   Net -1271.19 ml     Intake/Output this shift:  I/O this shift:  In: 74.41 [P.O.:30; I.V.:44.41]  Out: 500 [Urine:500]    Physical Exam:   Gen: Well developed, no acute distress   CV: S1 S2 N RRR, no edema   Chest: Good effort, CTAB    Labs:  Recent Labs   Lab 12/22/22  0424 12/21/22  2247 12/21/22  1837 12/21/22  1050 12/21/22  0416   Glucose 276* 199* 167*  More results in Results Review 75   BUN 62.0* 61.0* 60.0*  More results in Results Review 51.0*   Creatinine 4.0* 4.2* 4.1*  More results in Results Review 3.3*   Calcium 8.4 8.7 8.9  More results in Results Review 9.6   Sodium 132* 135 135  More results in Results Review 141   Potassium 4.9 4.9 4.8  More results in Results Review 5.8*   Chloride 101 101 103  More results in Results Review 108  CO2 '19 17 19  '$ More results in Results Review 17   Albumin 3.1* 3.1*  --   --  3.7   Phosphorus  --  7.2* 7.4*  --   --    Magnesium 2.8* 2.9* 2.8*  --  3.2*   More results in Results Review = values in this interval not displayed.     Recent Labs   Lab 12/21/22  2247 12/21/22  0416 12/20/22  2034   WBC 13.69* 13.49* 12.60*   Hgb 12.1* 13.9 14.6   Hematocrit 38.2 44.2 45.2   MCV 104.4* 102.3* 102.5*   MCH 33.1 32.2 33.1   MCHC 31.7 31.4* 32.3   RDW 15 16* 15   MPV 12.9* 12.6* 12.5   Platelets 110* 159 161

## 2022-12-23 ENCOUNTER — Inpatient Hospital Stay: Payer: Commercial Managed Care - POS

## 2022-12-23 DIAGNOSIS — N189 Chronic kidney disease, unspecified: Secondary | ICD-10-CM

## 2022-12-23 DIAGNOSIS — N179 Acute kidney failure, unspecified: Secondary | ICD-10-CM

## 2022-12-23 DIAGNOSIS — I5023 Acute on chronic systolic (congestive) heart failure: Secondary | ICD-10-CM

## 2022-12-23 DIAGNOSIS — C9110 Chronic lymphocytic leukemia of B-cell type not having achieved remission: Secondary | ICD-10-CM

## 2022-12-23 DIAGNOSIS — I471 Supraventricular tachycardia, unspecified: Secondary | ICD-10-CM

## 2022-12-23 DIAGNOSIS — D508 Other iron deficiency anemias: Secondary | ICD-10-CM | POA: Insufficient documentation

## 2022-12-23 DIAGNOSIS — I428 Other cardiomyopathies: Secondary | ICD-10-CM

## 2022-12-23 DIAGNOSIS — I82411 Acute embolism and thrombosis of right femoral vein: Secondary | ICD-10-CM

## 2022-12-23 LAB — COMPREHENSIVE METABOLIC PANEL
ALT: 304 U/L — ABNORMAL HIGH (ref 0–55)
ALT: 290 U/L — ABNORMAL HIGH (ref 0–55)
ALT: 280 U/L — ABNORMAL HIGH (ref 0–55)
ALT: 273 U/L — ABNORMAL HIGH (ref 0–55)
AST (SGOT): 129 U/L — ABNORMAL HIGH (ref 5–41)
AST (SGOT): 108 U/L — ABNORMAL HIGH (ref 5–41)
AST (SGOT): 91 U/L — ABNORMAL HIGH (ref 5–41)
AST (SGOT): 88 U/L — ABNORMAL HIGH (ref 5–41)
Albumin/Globulin Ratio: 1 (ref 0.9–2.2)
Albumin/Globulin Ratio: 1 (ref 0.9–2.2)
Albumin/Globulin Ratio: 1 (ref 0.9–2.2)
Albumin/Globulin Ratio: 0.9 (ref 0.9–2.2)
Albumin: 2.8 g/dL — ABNORMAL LOW (ref 3.5–5.0)
Albumin: 2.9 g/dL — ABNORMAL LOW (ref 3.5–5.0)
Albumin: 2.9 g/dL — ABNORMAL LOW (ref 3.5–5.0)
Albumin: 2.9 g/dL — ABNORMAL LOW (ref 3.5–5.0)
Alkaline Phosphatase: 87 U/L (ref 37–117)
Alkaline Phosphatase: 81 U/L (ref 37–117)
Alkaline Phosphatase: 74 U/L (ref 37–117)
Alkaline Phosphatase: 75 U/L (ref 37–117)
Anion Gap: 9 (ref 5.0–15.0)
Anion Gap: 13 (ref 5.0–15.0)
Anion Gap: 9 (ref 5.0–15.0)
Anion Gap: 12 (ref 5.0–15.0)
BUN: 59 mg/dL — ABNORMAL HIGH (ref 9.0–28.0)
BUN: 49 mg/dL — ABNORMAL HIGH (ref 9.0–28.0)
BUN: 50 mg/dL — ABNORMAL HIGH (ref 9.0–28.0)
BUN: 49 mg/dL — ABNORMAL HIGH (ref 9.0–28.0)
Bilirubin, Total: 1.2 mg/dL (ref 0.2–1.2)
Bilirubin, Total: 1.2 mg/dL (ref 0.2–1.2)
Bilirubin, Total: 1.1 mg/dL (ref 0.2–1.2)
Bilirubin, Total: 1.2 mg/dL (ref 0.2–1.2)
CO2: 24 mEq/L (ref 17–29)
CO2: 21 mEq/L (ref 17–29)
CO2: 24 mEq/L (ref 17–29)
CO2: 22 mEq/L (ref 17–29)
Calcium: 8 mg/dL (ref 7.9–10.2)
Calcium: 8.3 mg/dL (ref 7.9–10.2)
Calcium: 8.3 mg/dL (ref 7.9–10.2)
Calcium: 8.3 mg/dL (ref 7.9–10.2)
Chloride: 100 mEq/L (ref 99–111)
Chloride: 101 mEq/L (ref 99–111)
Chloride: 100 mEq/L (ref 99–111)
Chloride: 99 mEq/L (ref 99–111)
Creatinine: 3.1 mg/dL — ABNORMAL HIGH (ref 0.5–1.5)
Creatinine: 2.8 mg/dL — ABNORMAL HIGH (ref 0.5–1.5)
Creatinine: 2.8 mg/dL — ABNORMAL HIGH (ref 0.5–1.5)
Creatinine: 2.8 mg/dL — ABNORMAL HIGH (ref 0.5–1.5)
Globulin: 2.7 g/dL (ref 2.0–3.6)
Globulin: 2.9 g/dL (ref 2.0–3.6)
Globulin: 2.9 g/dL (ref 2.0–3.6)
Globulin: 3.2 g/dL (ref 2.0–3.6)
Glucose: 248 mg/dL — ABNORMAL HIGH (ref 70–100)
Glucose: 222 mg/dL — ABNORMAL HIGH (ref 70–100)
Glucose: 266 mg/dL — ABNORMAL HIGH (ref 70–100)
Glucose: 297 mg/dL — ABNORMAL HIGH (ref 70–100)
Potassium: 3.9 mEq/L (ref 3.5–5.3)
Potassium: 3.8 mEq/L (ref 3.5–5.3)
Potassium: 4.1 mEq/L (ref 3.5–5.3)
Potassium: 3.9 mEq/L (ref 3.5–5.3)
Protein, Total: 5.5 g/dL — ABNORMAL LOW (ref 6.0–8.3)
Protein, Total: 5.8 g/dL — ABNORMAL LOW (ref 6.0–8.3)
Protein, Total: 5.8 g/dL — ABNORMAL LOW (ref 6.0–8.3)
Protein, Total: 6.1 g/dL (ref 6.0–8.3)
Sodium: 133 mEq/L — ABNORMAL LOW (ref 135–145)
Sodium: 135 mEq/L (ref 135–145)
Sodium: 133 mEq/L — ABNORMAL LOW (ref 135–145)
Sodium: 133 mEq/L — ABNORMAL LOW (ref 135–145)
eGFR: 20.1 mL/min/{1.73_m2} — AB (ref >=60)
eGFR: 22.7 mL/min/{1.73_m2} — AB (ref >=60)
eGFR: 22.7 mL/min/{1.73_m2} — AB (ref >=60)
eGFR: 22.7 mL/min/{1.73_m2} — AB (ref >=60)

## 2022-12-23 LAB — COOXIMETRY PROFILE
Carboxyhemoglobin: 1.7 % (ref 0.0–3.0)
Carboxyhemoglobin: 0.7 % (ref 0.0–3.0)
Carboxyhemoglobin: 1.6 % (ref 0.0–3.0)
Carboxyhemoglobin: 1.5 % (ref 0.0–3.0)
Hematocrit Total Calculated: 32.9 % — ABNORMAL LOW (ref 40.0–54.0)
Hematocrit Total Calculated: 34 % — ABNORMAL LOW (ref 40.0–54.0)
Hematocrit Total Calculated: 34.2 % — ABNORMAL LOW (ref 40.0–54.0)
Hematocrit Total Calculated: 36.1 % — ABNORMAL LOW (ref 40.0–54.0)
Hemoglobin Total: 10.6 g/dL — ABNORMAL LOW (ref 13.0–17.0)
Hemoglobin Total: 11 g/dL — ABNORMAL LOW (ref 13.0–17.0)
Hemoglobin Total: 11.1 g/dL — ABNORMAL LOW (ref 13.0–17.0)
Hemoglobin Total: 11.7 g/dL — ABNORMAL LOW (ref 13.0–17.0)
Methemoglobin: 0.6 % (ref 0.0–3.0)
Methemoglobin: 0.9 % (ref 0.0–3.0)
Methemoglobin: 0.8 % (ref 0.0–3.0)
Methemoglobin: 0.6 % (ref 0.0–3.0)
O2 Content: 8.9 Vol %
O2 Content: 10.6 Vol %
O2 Content: 8.5 Vol %
O2 Content: 8.9 Vol %
Oxygenated Hemoglobin: 59.2 % — ABNORMAL LOW (ref 85.0–98.0)
Oxygenated Hemoglobin: 68.2 % — ABNORMAL LOW (ref 85.0–98.0)
Oxygenated Hemoglobin: 54.4 % — ABNORMAL LOW (ref 85.0–98.0)
Oxygenated Hemoglobin: 54.1 % — ABNORMAL LOW (ref 85.0–98.0)

## 2022-12-23 LAB — CBC AND DIFFERENTIAL
Absolute NRBC: 0.02 10*3/uL — ABNORMAL HIGH (ref 0.00–0.00)
Basophils Absolute Automated: 0 10*3/uL (ref 0.00–0.08)
Basophils Automated: 0 %
Eosinophils Absolute Automated: 0.05 10*3/uL (ref 0.00–0.44)
Eosinophils Automated: 0.7 %
Hematocrit: 33.5 % — ABNORMAL LOW (ref 37.6–49.6)
Hgb: 10.8 g/dL — ABNORMAL LOW (ref 12.5–17.1)
Immature Granulocytes Absolute: 0.07 10*3/uL (ref 0.00–0.07)
Immature Granulocytes: 1 %
Instrument Absolute Neutrophil Count: 3.94 10*3/uL (ref 1.10–6.33)
Lymphocytes Absolute Automated: 2.99 10*3/uL (ref 0.42–3.22)
Lymphocytes Automated: 41.6 %
MCH: 33.4 pg (ref 25.1–33.5)
MCHC: 32.2 g/dL (ref 31.5–35.8)
MCV: 103.7 fL — ABNORMAL HIGH (ref 78.0–96.0)
MPV: 12.7 fL — ABNORMAL HIGH (ref 8.9–12.5)
Monocytes Absolute Automated: 0.13 10*3/uL — ABNORMAL LOW (ref 0.21–0.85)
Monocytes: 1.8 %
Neutrophils Absolute: 3.94 10*3/uL (ref 1.10–6.33)
Neutrophils: 54.9 %
Nucleated RBC: 0.3 /100 WBC — ABNORMAL HIGH (ref 0.0–0.0)
Platelets: 81 10*3/uL — ABNORMAL LOW (ref 142–346)
RBC: 3.23 10*6/uL — ABNORMAL LOW (ref 4.20–5.90)
RDW: 15 % (ref 11–15)
WBC: 7.18 10*3/uL (ref 3.10–9.50)

## 2022-12-23 LAB — PT/INR
PT INR: 1.5 — ABNORMAL HIGH (ref 0.9–1.1)
PT: 17.1 s — ABNORMAL HIGH (ref 10.1–12.9)

## 2022-12-23 LAB — WHOLE BLOOD GLUCOSE POCT
Whole Blood Glucose POCT: 197 mg/dL — ABNORMAL HIGH (ref 70–100)
Whole Blood Glucose POCT: 225 mg/dL — ABNORMAL HIGH (ref 70–100)
Whole Blood Glucose POCT: 227 mg/dL — ABNORMAL HIGH (ref 70–100)
Whole Blood Glucose POCT: 240 mg/dL — ABNORMAL HIGH (ref 70–100)
Whole Blood Glucose POCT: 262 mg/dL — ABNORMAL HIGH (ref 70–100)
Whole Blood Glucose POCT: 269 mg/dL — ABNORMAL HIGH (ref 70–100)

## 2022-12-23 LAB — VANCOMYCIN, RANDOM: Vancomycin Random: 11.9 ug/mL

## 2022-12-23 LAB — MAGNESIUM
Magnesium: 2.4 mg/dL (ref 1.6–2.6)
Magnesium: 2.3 mg/dL (ref 1.6–2.6)
Magnesium: 2.2 mg/dL (ref 1.6–2.6)

## 2022-12-23 LAB — HEPARIN-INDUCED PLATELET ANTIBODIES WITH REFLEX TO SRA, UFH: Heparin Induced Plt AB w/Reflex SRA, UFH: NEGATIVE

## 2022-12-23 LAB — LACTIC ACID
Lactic Acid: 1.4 mmol/L (ref 0.2–2.0)
Lactic Acid: 1.1 mmol/L (ref 0.2–2.0)
Lactic Acid: 2.2 mmol/L — ABNORMAL HIGH (ref 0.2–2.0)
Lactic Acid: 1.6 mmol/L (ref 0.2–2.0)

## 2022-12-23 LAB — APTT
PTT: 68 s — ABNORMAL HIGH (ref 27–39)
PTT: 63 s — ABNORMAL HIGH (ref 27–39)
PTT: 33 s (ref 27–39)

## 2022-12-23 MED ORDER — INSULIN LISPRO 100 UNIT/ML SOLN (WRAP)
1.0000 [IU] | Freq: Three times a day (TID) | Status: DC
Start: 2022-12-24 — End: 2022-12-24
  Administered 2022-12-24 (×3): 7 [IU] via SUBCUTANEOUS
  Filled 2022-12-23 (×3): qty 21

## 2022-12-23 MED ORDER — POTASSIUM CHLORIDE 20 MEQ/50ML IV SOLN
20.0000 meq | Freq: Once | INTRAVENOUS | Status: AC
Start: 2022-12-23 — End: 2022-12-23
  Administered 2022-12-23: 20 meq via INTRAVENOUS
  Filled 2022-12-23: qty 50

## 2022-12-23 MED ORDER — INSULIN LISPRO 100 UNIT/ML SOLN (WRAP)
1.0000 [IU] | Freq: Three times a day (TID) | Status: DC
Start: 2022-12-23 — End: 2022-12-23

## 2022-12-23 MED ORDER — HYDRALAZINE HCL 25 MG PO TABS
50.0000 mg | ORAL_TABLET | Freq: Three times a day (TID) | ORAL | Status: DC
Start: 2022-12-23 — End: 2022-12-25
  Administered 2022-12-23 – 2022-12-25 (×5): 50 mg via ORAL
  Filled 2022-12-23 (×5): qty 2

## 2022-12-23 MED ORDER — POTASSIUM CHLORIDE CRYS ER 20 MEQ PO TBCR
40.0000 meq | EXTENDED_RELEASE_TABLET | Freq: Once | ORAL | Status: AC
Start: 2022-12-23 — End: 2022-12-23
  Administered 2022-12-23: 40 meq via ORAL
  Filled 2022-12-23: qty 2

## 2022-12-23 MED ORDER — SODIUM CHLORIDE 0.9 % IV SOLN
0.0500 mg/kg/h | INTRAVENOUS | Status: DC
Start: 2022-12-23 — End: 2022-12-23
  Filled 2022-12-23: qty 250

## 2022-12-23 MED ORDER — HEPARIN (PORCINE) IN D5W 50-5 UNIT/ML-% IV SOLN (UNITS/KG/HR ONLY)
18.0000 [IU]/kg/h | INTRAVENOUS | Status: DC
Start: 2022-12-23 — End: 2022-12-27
  Administered 2022-12-23 – 2022-12-25 (×4): 18 [IU]/kg/h via INTRAVENOUS
  Filled 2022-12-23 (×4): qty 500

## 2022-12-23 MED ORDER — INSULIN LISPRO 100 UNIT/ML SOLN (WRAP)
1.0000 [IU] | Freq: Every evening | Status: DC
Start: 2022-12-23 — End: 2022-12-24
  Administered 2022-12-23: 3 [IU] via SUBCUTANEOUS
  Filled 2022-12-23: qty 9

## 2022-12-23 MED ORDER — HEPARIN (PORCINE) IN D5W 50-5 UNIT/ML-% IV SOLN (UNITS/KG/HR ONLY)
10.7000 [IU]/kg/h | INTRAVENOUS | Status: DC
Start: 2022-12-23 — End: 2022-12-23

## 2022-12-23 MED ORDER — HYDRALAZINE HCL 25 MG PO TABS
25.0000 mg | ORAL_TABLET | Freq: Once | ORAL | Status: AC
Start: 2022-12-23 — End: 2022-12-23
  Administered 2022-12-23: 25 mg via ORAL
  Filled 2022-12-23: qty 1

## 2022-12-23 MED ORDER — FUROSEMIDE 10 MG/ML IJ SOLN
160.0000 mg | Freq: Once | INTRAMUSCULAR | Status: AC
Start: 2022-12-23 — End: 2022-12-23
  Administered 2022-12-23: 160 mg via INTRAVENOUS
  Filled 2022-12-23: qty 16

## 2022-12-23 MED ORDER — BISACODYL 10 MG RE SUPP
10.0000 mg | Freq: Once | RECTAL | Status: AC
Start: 2022-12-23 — End: 2022-12-23
  Administered 2022-12-23: 10 mg via RECTAL
  Filled 2022-12-23: qty 1

## 2022-12-23 MED ORDER — IRON SUCROSE 20 MG/ML IV SOLN
200.0000 mg | INTRAVENOUS | Status: AC
Start: 2022-12-23 — End: 2022-12-27
  Administered 2022-12-23 – 2022-12-27 (×5): 200 mg via INTRAVENOUS
  Filled 2022-12-23 (×5): qty 10

## 2022-12-23 MED ORDER — POLYETHYLENE GLYCOL 3350 17 G PO PACK
17.0000 g | PACK | Freq: Every day | ORAL | Status: DC
Start: 2022-12-23 — End: 2023-01-04
  Administered 2022-12-23 – 2023-01-04 (×6): 17 g via ORAL
  Filled 2022-12-23 (×12): qty 1

## 2022-12-23 NOTE — Plan of Care (Signed)
Problem: Compromised Sensory Perception  Goal: Sensory Perception Interventions  Outcome: Progressing     Problem: Compromised Moisture  Goal: Moisture level Interventions  Outcome: Progressing     Problem: Compromised Activity/Mobility  Goal: Activity/Mobility Interventions  Outcome: Progressing     Problem: Compromised Nutrition  Goal: Nutrition Interventions  Outcome: Progressing     Problem: Compromised Friction/Shear  Goal: Friction and Shear Interventions  Outcome: Progressing     Problem: Moderate/High Fall Risk Score >5  Goal: Patient will remain free of falls  Outcome: Progressing

## 2022-12-23 NOTE — UM Notes (Signed)
Patient Name: [name] [name], [name] [name]  DOB: 1946-10-07  Unit: [facility] CICU  Level of Care:  ICU    Continued Stay Review  Identified [place] barriers: Medical Readiness    Brief Synopsis: 77 y.o. 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 Since, started on dobutamine and transferred to [place] for LVAD workup/evaluation, now status post right heart cath on 3/4, diuresing and optimizing cardiac index, aspiration event.     CSR Date: 12/23/22    3/6: Started vanc and zosyn. Blood culture positive, repeat sent. RML possible infiltrate on CXR. CI: 1.7 overnight, lactate cleared. Wife to bring in CLL medication.      Weaning O2. HFNC to 6 L via NC. IV lasix. IV Zosyn and Vancomycin. Amiodarone gtt. Dobutamine gtt. Heparin gtt.     Vitals:  Temp:  [97.4 F (36.3 C)]   Heart Rate:  [77-88]   Resp Rate:  [13-29]   BP: (92-123)/(58-72)   SpO2:  [95 %-100 %]   Height:  [182.9 cm (6' 0.01")]     Intake & Output:  I/O last 3 completed shifts:  In: 3020.41 [P.O.:1030; I.V.:1312.91; IV Piggyback:677.5]  Out: 5750 [Urine:5750]  I/O this shift:  In: 404.67 [I.V.:293; IV Piggyback:111.67]  Out: 1725 [Urine:1725]    Abnormal Labs:   12/23/22 03:34 12/23/22 10:15   Hemoglobin 10.8 (L)    Hemoglobin Total 10.6 (L) 11.0 (L)   Hematocrit 33.5 (L)    Hematocrit Total Calculated 32.9 (L) 34.0 (L)   Platelet Count 81 (L)    RBC 3.23 (L)       12/23/22 03:34 12/23/22 10:15   Glucose 248 (H) 222 (H)   BUN 59.0 (H) 49.0 (H)   Creatinine 3.1 (H) 2.8 (H)   Sodium 133 (L) 135      12/23/22 03:34 12/23/22 10:15   AST 129 (H) 108 (H)   ALT 304 (H) 290 (H)     Diagnostics:  X-ray chest AP portable  Result Date: 12/23/2022  Improved inspiratory effort with mild dependent atelectasis  [name], MD 12/23/2022 8:16 AM  XR Chest AP Portable  Result Date: 12/23/2022   Airspace disease in the medial right lower lobe. Blanchard Mane 12/23/2022 12:46 AM    Medications:  Scheduled Meds:  Current Facility-Administered Medications   Medication Dose Route Frequency    aspirin EC  81 mg Oral Daily    atorvastatin  40 mg Oral QHS    hydrALAZINE  25 mg Oral Q8H [place]    insulin glargine  10 Units Subcutaneous QAM    Or    insulin glargine  5 Units Subcutaneous QAM    insulin lispro  1-8 Units Subcutaneous Q4H SCH    iron sucrose  200 mg Intravenous Q24H SCH    isosorbide dinitrate  10 mg Oral TID - Nitrate Free Interval    niacin  500 mg Oral BID    piperacillin-tazobactam  4.5 g Intravenous Q8H    polyethylene glycol  17 g Oral Daily    senna-docusate  1 tablet Oral Q12H [place]    vancomycin   Intravenous See Admin Instructions     Continuous Infusions:   amiodarone 0.5 mg/min (12/23/22 1200)    DOBUTamine 5 mcg/kg/min (12/23/22 1200)    [  Held by provider] heparin infusion 25,000 units/500 mL (VTE/Moderate Intensity) 15 Units/kg/hr (12/23/22 1200)     IV PRN & One-Time Medications over last 24 hours:  Lasix 160 mg IV x 2  Potassium Chloride 20 mEq IV x 2  Vancomycin 1,750 mg IV x 1    Assessment/Plan:  System (Problem List) Plan   NEUROLOGICAL:  NAI     Current PT Order: yes  Current OT Order: yes    Delirium precautions including maintenance of day/night wake cycles, frequent reorientation as needed  Physical therapy/Occupational Therapy/Out of bed to chair, as indicated/tolerated  Address pain as needed  Promote movement, order PT OT      CARDIOVASCULAR:   Cardiogenic shock SCAI stage C  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  only with 600 mL out, will reassess and redose if needed at 2 PM.  Following  filling pressures, sodium, physical exam to guide diuresis  Afterload: continue home hydralazine 25 mg Q8H, and isordil 10 mg TID, holding BB  MCS: None  Inotropy: dobutamine 5 mcg/kg/min, may consider increasing given cardiac index low  Device: Medtronik ICD  Latest TTE: 3/4 with EF 11%, severe global hypokinesis   Advanced therapies: was undergoing evaluation for LVAD, delayed by renal function worsening  Continue ASA 81 mg QD and Lipitor 40 mg QHS  heparin infusion, VTE protocol   Blood Pressure Goal: MAP > 65     CVP: 20, 16,  PAP 5/25,  SVR:987   CI: 2.24 (Fick) 1.6 (Therm)  SVO2:  0.59   PULMONARY:   Hx of COPD     Last vent settings if on vent:  FiO2:  [40 %-45 %] 45 %  Ventilator Time (if on vent):     Aspiration event last night with evening meal, concern for aspiration, speech eval pending vanc/Zosyn started  March 3rd CXR with mild pulmonary edema, improved aeration on most recent CXR  Now on supplemental oxygen as needed to keep sat > 90%  Diuresis as above  Add incentive spirometer  Attempt to wean from high flow to nasal cannula, respiratory toilet as needed   RENAL:   AKI on CKD likely from chronic cardiorenal progression        Intake/Output Summary (Last 24 hours) at 12/23/2022 0700  Last data filed at 12/23/2022 0600      Gross per 24 hour   Intake 2469.91 ml   Output 4450 ml   Net -1980.09 ml        Follows with [facility], consulted and appreciate recommendations  baseline serum creatinine about 2.5 mg/dL, Serum creatinine at 4 mg/dL, now down to 2.8 indicating better organ perfusion  Follow BMP and mag q6hr while diuresing   Avoid nephrotoxin  Promote bedside urinal      GASTROINTESTINAL:  Transaminitis in setting of cardioversion     Last Bowel Movement:   Last BM Date: 12/19/22 LFT's daily, improving  Nutrition: N.p.o. until SLP eval, otherwise cardiac diet, renal diet  Bowel Regimen: senna/docusate  INFECTIOUS DISEASE:   C/f aspiration  Positive BC grams stain  Leukocytosis, resolved     Temp (24hrs), Avg:97.4 F (36.3 C), Min:97.4 F (36.3 C), [name] [name]:97.4 F (36.3 C)     * No values recorded between 12/22/2022 12:25 PM and 12/23/2022  7:00 AM *        Lab Results   Component Value Date     WBC 7.18 12/23/2022      CAUTI Prevention (n/a if blank):  Foley Day:   Blood Steam Infection Prevention (n/a if blank):   Temporary Catheter with Pigtail 12/22/22 Internal Jugular Right  Pulmonary Artery Catheter 12/22/22 Internal jugular Right COVID/flu negative  Last culture dates: Blood cultures 3/4, 1 bottle positive for Gram stain of Gram-positive cocci in clusters, coag negative staph  No leukocytosis, afebrile, but with new oxygen requirement, overall not giving infectious picture  vanc/Zosyn started  MRSA nares, may de-escalate Vanco if negative  Possible that positive Gram stain is contaminant, however will cover as above  Checks x-ray with possible right lower lobe infiltrate, increased airspace haziness/vascular congestion compared to x-ray from last night could represent pulmonary edema versus developing pneumonia.    Formal speech eval, n.p.o. until then                        HEME/ONC:   Hx of RLE DVT on Eliquis  Hx of CLL, on acalabrutinib  Acutely worsening thrombocytopenia     heparin (porcine) injection 3,700 Units   heparin (porcine) injection 7,450 Units   heparin 25,000 units in dextrose 5% 500 mL infusion (premix)  heparin gtt holding  HIT assay, will start bivalirudin if indicated in afternoon, however if negative will reinitiate heparin  SCDs  Iron profile, type and screen, and anti Xa   Ferritin: 57, iron saturation low at 10, iron low at 32,   calculated total iron deficit 1200 using target hemoglobin of 14.    Initiate IV iron 200 over 5 days clinically appropriate  If proven to be overtly bacteremic will reconsider, however do not favor this scenario currently   ENDO/RHEUM:  T2DM  Sliding  scale  HgbA1C within acceptable range given age  Blood glucose in the mid 200s above acceptable  Goal glucose range 100 - 180  Glargine 10 units daily, 5 while n.p.o.       Primary Payor: [employer] / Plan: CIG[employer] POS / Product Type: COMMERCIAL /   UTILIZATION REVIEW CONTACT: Robyne Askew RN, BSN  Utilization Review   [place]  [phone]  [phone]  Email: [email or web address]  Tax IDKU:[id number]      NOTES TO REVIEWER:     This clinical review is based on/compiled from documentation provided by the treatment team within the patient's medical record.

## 2022-12-23 NOTE — Progress Notes (Signed)
Patient's Name: [name] [name]   Admit Date: 12/21/2022  Medical Record Number: [id number]   Code Status: full code  Room: FI102/FI102-01  Date/Time: 12/24/22 8:00 AM  Cardiologist of record: [facility] (Dr. Judeth Horn)    INTERVAL EVENTS:  Passed SLP eval and restarted on a diet   Vitals: Afebrile, HR high 70s to low 80s, RR high teens to low 20s, BP 110s/60s, 97% on room air  Telemetry reviewed: 1 run of NSVT (9 beats)    I/O:  -1.250 cc  PA Catheter: RA 17-20, PA 61/24 (mean of 87), PCWP 28 (v to 35), CI/CO Fick 2.07/4.5, SVR 1208.89, PAPI 2.06, CPO 0.93   Labs: Hemoglobin 10.8 [10.8], platelets 69 [81, 110, 159, 161], BUN 46 [49, 49, 59, 61], creatinine 2.6 [2.8, 2.8, 3.1, 3.3, 4.0], sodium 132 [133, 135, 133], lactic acid 1.1, AST 80 [88], ALT 257 [273, 290, 304, 342], albumin 3.0  Imaging/Diagnostics: None overnight  Microbiology: 12/24/22   No Growth after 1 day/s of incubation.   Current Infusions: Dobutamine 5 mcg/kg/min, amiodarone 0.5 Mg per minute, heparin  Subjectively, denies any medical complaints.  No shortness of breath, orthopnea, PND.  Feels less tired than when he first presented to the hospital and with improved respiratory symptoms.  Been 7 hours in the chair yesterday and was looking forward to getting out of bed.  Has good appetite.    IMPRESSIONS   Heart failure-cardiogenic shock, SCAI stage C  Acute on chronic decompensated heart failure, LVEF less than 15%  Ischemic cardiomyopathy, NYHA class IV, ACC/AHA stage D, currently undergoing DT LVAD evaluation  RHC 09/09/22: RA 7, RV 39/4, PA 41/21/29, PCWP 14, PA sat 62%, RA sat 59%, AO sat 95%, Fick CO/CI 4.0/2.0, TD CO/CI 3.5/1.7, MAP 89, PAPi 3, PVR 3.75-4.3 WU  TTE 12/21/22: Severely reduced LV function with EF 11%, LVIDD 6.8 cm, global hypokinesis, mildly dilated RV with moderately decreased RV systolic function, severe functional MR, severe tricuspid regurgitation with RVSP 51 mmHg  RHC (12/22/22): RA 15, RV 48/1. 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  Severe tricuspid regurgitation  Hyperlipidemia  Hypertension  AKI on CKD stage IV with baseline creatinine 1.6 as of 03/2022  Stage IV CLL, diagnosed in September 2021, 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  Right lower extremity DVT (5/23)   Type 2 diabetes with an A1c 5.9%  Stable COPD  Multiple hospital admissions for acute decompensated heart failure in 2023  DM2  Thrombocytopenia  Suspect abx induced vs critical illness   HIT panel pending  Iron deficit anemia   Iron studies (3/6): Iron sat 10, Ferritin 57  Aspiration event  Aspirated on 12/22/22, passed swallow eval thereafter     RECOMMENDATIONS  Heart failure-cardiogenic shock, SCAI stage C  Acute on chronic decompensated heart failure, LVEF less than 15%  Preload: Lasix '200mg'$  IV BID with Metolazone '10mg'$  PO daily for FG of -2 L  Inotrope: C/ dobutamine 5 mcg/kg/min   Afterload: hydralazine 50 mg 3 times daily and Isordil 10 mg 3 times daily for afterload reduction   MCS: If he decompensates, would consider IABP as a bridge to DT LVAD   Advanced  therapies candidacy:Currently being evaluated for DT LVAD. Concern for kidney function but it is improving with diuresis and closer to his baseline creatinine in 03/2022 was 1.5   In case of deterioration: IABP as above  GDMT for Heart failure  Beta blocker: Hold in setting of cardiogenic shock  ACE/ARB/ARNI: Hold in setting of worsening renal function  MRA: Hold in setting of worsening renal function.  Previously has had hyperkalemia issues  SGLT2i: Hold for possible procedures, and reduced renal function    Ectopy   Convert IV amiodarone to PO amiodarone     Iron deficit anemia   Iron  studies (3/6): Iron sat 10, Ferritin 57  IV iron repletion (12/23/22-12/28/22)    Thrombocytopenia   Consult to hematology regarding thrombocytopenia and hx of CLL given likelihood of Impella/VAD and need for future anticoagulation       Right lower extremity DVT  Likely provoked in the setting of cancer  C/ heparin infusion pending HIT panel    Rest of care per [facility]     Toya Smothers, MD  Cardiology Fellow, PGY4   12/24/22 8:00 AM          Reason for Consult: AHF-CS    History:  77 y.o. 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  He was given a dose of adenosine and had an unsuccessful cardioversion with 120 J.  Adenosine eventually converted to sinus rhythm and telemetry showed a sensed V paced rhythms.    Admitted to ICU at [facility] for further management. However, then transferred to [place] on new Dobutamine 2.5 mcg/kg/min for LVAD consideration.      Of note follows with [facility]. Has been followed by Advanced Heart Failure for advanced therapies, including LVAD where he has been seen by Dr. Alain Marion. Home medications include Eliquis 2.5 mg BID, ASA 81 mg QD, Lipitor 40 mg QHS, Jardiance 10 mg QD, Hydralazine 25 mg Q8H, Isosorbide Dinitrate 10 mg TID, metoprolol succinate XL 12. 5 mg QD, torsemide 40 mg BID, Lantus 50U QD. Was previously on Aldactone but discontinued given renal function. Dry weight is around 185 lb per 10/2022, weight today around 195-199 lb.     Follows with Dr. Mervin Kung of [facility] for CLL. Dx'd in 06/2020. Thought to be well controlled on acalabrutinib.     Follows with [facility] for CKD. Cr increased from 1.5 in 03/2022 to 2.3 in 09/2022. Previously on HD/UF in 2018.      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  He lives with his wife in [place], previously was living in [place] but due to high cost of living and exorbitant housing rates, he moved to [place].  He has 3 sons and 1 daughter, and 1 grandchild.  They all live in different parts of the [place] and no one is close by.  His primary caregiver is his wife    Current Meds:  aspirin EC, 81 mg, Oral, Daily  atorvastatin, 40 mg, Oral, QHS  hydrALAZINE, 50 mg, Oral, Q8H SCH  insulin glargine, 10 Units, Subcutaneous, QAM   Or  insulin glargine, 5 Units, Subcutaneous, QAM  insulin  lispro, 1-4 Units, Subcutaneous, QHS  insulin lispro, 1-8 Units, Subcutaneous, TID AC  iron sucrose, 200 mg, Intravenous, Q24H SCH  isosorbide dinitrate, 10 mg, Oral, TID - Nitrate Free Interval  niacin, 500 mg, Oral, BID  piperacillin-tazobactam, 4.5 g, Intravenous, Q8H  polyethylene glycol, 17 g, Oral, Daily  senna-docusate, 1 tablet, Oral, Q12H SCH  vancomycin, 1,500 mg, Intravenous, Once  vancomycin, , Intravenous, See Admin Instructions         Drips:   amiodarone 0.5 mg/min (12/24/22 0700)    DOBUTamine 5 mcg/kg/min (12/24/22 0700)    heparin infusion 25,000 units/500 mL (VTE/Moderate Intensity) 18 Units/kg/hr (12/24/22 0700)       PMH:    Past Medical History:   Diagnosis Date    Acute CHF     NOV  & DEC 2018, - DIALYSIS CATH PLACED Aug 24 2017 TO REMOVE FLUID     Acute systolic (congestive) heart failure 08/2017    Anxiety     CAD (coronary artery disease)     Cardiomyopathy     nonischemic    CHF (congestive heart failure) 08/12/2014, 2013    Chronic obstructive pulmonary disease     POSSIBLE PER PT HE USES  SYNBICORT BID ABD  BREO INHALER PRN    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    Coronary artery disease 2011    CORONARY STENT PLACEMENT FOLLOWED BY [facility]    Depression     echocardiogram 02/2016, 11/2016, 09/2017, 12/20/2017    GERD (gastroesophageal  reflux disease)     Heart attack 2011    and 2014    Hyperlipemia     Hypertension     ICD (implantable 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     PLACED 2014  [facility]  LAST INTERROGATION April 01 2018 REPORT REQUESTED    Ischemic cardiomyopathy     EF 15% ON ECHO 09-20-2017 DR GARG IN EPIC    Nonischemic cardiomyopathy     NSTEMI (non-ST elevated myocardial infarction)     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    Pacemaker 2014    MEDTRONIC ICD/PACEMAKER COMBO LAST INTERROGATION  12-12 2018    Pneumonia 09/2016    Primary cardiomyopathy     Ischemic cardiomyopathy EF 15% ON ECHO 09-20-2017 DR GARG IN EPIC    Sepsis 08/2017    Syncope and collapse     PRIOR TO ICD/PACEMAKER PLACED 2014    Type 2 diabetes mellitus, controlled DX 1998    BS  AVG 100  A1C 7.4 Dec 27 2017    [place]     PT SEE HIS PMD April 01 2018 RE THIS-TOLD TO SEE PMD BY [facility] JUNE 12 WHEN THEY SAW HIM FOR A CL       PSH:    Past Surgical History:   Procedure Laterality Date    BIV  11/10/2016    ICD [place]  UPGRADE     CARDIAC CATHETERIZATION  03/2010    CORONARY STENT PLACEMENT X 2 PER PT; LM normal, LAD 70-80% distal lesion at apex, 20% lesion mid CFX    CARDIAC CATHETERIZATION  12/2012    CARDIAC DEFIBRILLATOR PLACEMENT  12/2012    [facility]    CARDIAC PACEMAKER PLACEMENT  2014    MEDTRONIC ICD/PACEMAKER PLACED [facility]    CIRCUMCISION  AGE 73    COLONOSCOPY  2009    CORRECTION HAMMER TOE      duodenal ulcer  1973    STRESS RELATED IN SCHOOL    ECHOCARDIOGRAM, TRANSTHORACIC  02/2016 11/2016 09/2017 12/2017    ECHOCARDIOGRAM, TRANSTHORACIC  02/2016,11/2016,12/20/2017    EGD N/A 08/15/2014    Procedure: EGD;  Surgeon: Loraine Maple, MD;  Location: [facility];  Service: Gastroenterology;  Laterality: N/A;  egd w/ bx    EGD  1975    EGD, COLONOSCOPY N/A 04/04/2018    Procedure: EGD with bxs, COLONOSCOPY with polypectomy and clipping;  Surgeon: Omer Jack, MD;  Location: [facility];  Service: Gastroenterology;  Laterality: N/A;    FRACTURE  SURGERY  AGE 89     RT  ANKLE- CAST APPLIED    HERNIA REPAIR  AGE 89    LEFT INGUINAL HERNIA    MPI nuclear study  06/2016    orthopedic surgery  AGE 44    RT foot corrective - [facility]    RIGHT HEART CATH Right 09/09/2022    Procedure: Right Heart Cath;  Surgeon: Wandra Arthurs, MD;  Location: [facility];  Service: Cardiovascular;  Laterality: Right;  tx from [place]       FH:  Family History   Problem Relation Age of Onset    Breast cancer Mother     Heart attack Mother 38    Hypertension Mother     Diabetes Mother     Coronary artery disease Mother     Diabetes Brother     Heart attack Father     Hypertension Father        Social History:  Social History     Socioeconomic History    Marital status: Married     Spouse name: Architect    Number of children: 0    Years of education: Not on file    Highest education level: Not on file   Occupational History    Occupation: Pharmacist, 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[employer], history    Tobacco Use    Smoking status: Never    Smokeless tobacco: Never   Vaping Use    Vaping Use: Never used   Substance and Sexual Activity    Alcohol use: Not Currently    Drug use: No    Sexual activity: Yes     Partners: Female   Other Topics Concern    Not on file   Social History Narrative    Not on file     Social Determinants of Health     Financial Resource Strain: Low Risk  (12/22/2022)    Overall Financial Resource Strain (CARDIA)     Difficulty of Paying Living Expenses: Not hard at all   Food Insecurity: No Food Insecurity (09/12/2022)    Hunger Vital Sign     Worried About Running Out of Food in the Last Year: Never true     [place] in the Last Year: Never true   Transportation Needs: No Transportation Needs (12/22/2022)    PRAPARE - Armed forces logistics/support/administrative officer (Medical): No     Lack of Transportation (Non-Medical): No   Physical Activity: Not on file   Stress: Not on file   Social Connections: Not on file   Intimate Partner Violence: Not At Risk  (09/12/2022)    Humiliation, Afraid, Rape, and Kick questionnaire     Fear of Current or Ex-Partner: No     Emotionally Abused: No     Physically Abused: No     Sexually Abused: No   Housing Stability: Low Risk  (12/22/2022)  Housing Stability Vital Sign     Unable to Pay for Housing in the Last Year: No     Number of Places Lived in the Last Year: 1     Unstable Housing in the Last Year: No       Home medications:   Prior to Admission medications    Medication Sig Start Date End Date Taking? Authorizing Provider   apixaban (ELIQUIS) 5 MG Take 1 tablet (5 mg) by mouth every 12 (twelve) hours For 1 week followed by 1 tablet twice daily 09/14/22   Irma Newness, MD   aspirin EC 81 MG EC tablet Take 1 tablet (81 mg) by mouth daily    [provider]   atorvastatin (LIPITOR) 40 MG tablet Take 1 tablet (40 mg total) by mouth nightly 07/16/21   Carlis Abbott, MD   Calquence 100 MG Tab  04/07/22   [provider]   hydrALAZINE (APRESOLINE) 25 MG tablet Take 1 tablet (25 mg) by mouth every 8 (eight) hours 09/14/22 12/13/22  Irma Newness, MD   Insulin Glargine-yfgn (Semglee, yfgn,) 100 UNIT/ML Solution Pen-injector 50 Units by Subdermal route daily 10/26/22   Clearance Coots, MD   isosorbide dinitrate (ISORDIL) 10 MG tablet Take 1 tablet (10 mg) by mouth 3 (three) times daily 09/14/22 12/13/22  Irma Newness, MD   Jardiance 10 MG tablet TAKE 1 TABLET(10 MG) BY MOUTH DAILY 06/05/22   Clotilde Dieter, NP   loratadine (CLARITIN) 10 MG tablet Take 1 tablet (10 mg) by mouth daily 04/18/22   Regis Bill, MD   metoprolol succinate XL (TOPROL-XL) 25 MG 24 hr tablet Take 0.5 tablets (12.5 mg) by mouth daily 05/22/22   Clotilde Dieter, NP   niacin (NIASPAN) 500 MG CR tablet TAKE 1 TABLET BY MOUTH TWICE DAILY  Patient taking differently: 250 mg Taking half a tablet 02/23/22   Clearance Coots, MD   torsemide (DEMADEX) 20 MG tablet Take 2 tablets (40 mg) by mouth 2 (two) times daily 09/14/22   Irma Newness, MD        Inpatient/ER  medications:  Medications Prior to Admission   Medication Sig    apixaban (ELIQUIS) 5 MG Take 1 tablet (5 mg) by mouth every 12 (twelve) hours For 1 week followed by 1 tablet twice daily    aspirin EC 81 MG EC tablet Take 1 tablet (81 mg) by mouth daily    atorvastatin (LIPITOR) 40 MG tablet Take 1 tablet (40 mg total) by mouth nightly    Calquence 100 MG Tab     hydrALAZINE (APRESOLINE) 25 MG tablet Take 1 tablet (25 mg) by mouth every 8 (eight) hours    Insulin Glargine-yfgn (Semglee, yfgn,) 100 UNIT/ML Solution Pen-injector 50 Units by Subdermal route daily    isosorbide dinitrate (ISORDIL) 10 MG tablet Take 1 tablet (10 mg) by mouth 3 (three) times daily    Jardiance 10 MG tablet TAKE 1 TABLET(10 MG) BY MOUTH DAILY    loratadine (CLARITIN) 10 MG tablet Take 1 tablet (10 mg) by mouth daily    metoprolol succinate XL (TOPROL-XL) 25 MG 24 hr tablet Take 0.5 tablets (12.5 mg) by mouth daily    niacin (NIASPAN) 500 MG CR tablet TAKE 1 TABLET BY MOUTH TWICE DAILY (Patient taking differently: 250 mg Taking half a tablet)    torsemide (DEMADEX) 20 MG tablet Take 2 tablets (40 mg) by mouth 2 (two) times daily       Current Inpatient :  Current Facility-Administered Medications  Medication Dose Route Frequency    aspirin EC  81 mg Oral Daily    atorvastatin  40 mg Oral QHS    hydrALAZINE  50 mg Oral Q8H [place]    insulin glargine  10 Units Subcutaneous QAM    Or    insulin glargine  5 Units Subcutaneous QAM    insulin lispro  1-4 Units Subcutaneous QHS    insulin lispro  1-8 Units Subcutaneous TID AC    iron sucrose  200 mg Intravenous Q24H SCH    isosorbide dinitrate  10 mg Oral TID - Nitrate Free Interval    niacin  500 mg Oral BID    piperacillin-tazobactam  4.5 g Intravenous Q8H    polyethylene glycol  17 g Oral Daily    senna-docusate  1 tablet Oral Q12H [place]    vancomycin  1,500 mg Intravenous Once    vancomycin   Intravenous See Admin Instructions      amiodarone 0.5 mg/min (12/24/22 0700)    DOBUTamine 5 mcg/kg/min  (12/24/22 0700)    heparin infusion 25,000 units/500 mL (VTE/Moderate Intensity) 18 Units/kg/hr (12/24/22 0700)       ROS:  No constitutional complaints, no respiratory complaints, no musculoskeletal complaints, or other pertinent review of symptoms except as noted in the history of present illness.    Physical Exam:  I/O last 3 completed shifts:  In: 4440.49 [P.O.:1370; I.V.:2121.32; IV Piggyback:949.17]  Out: 4750 [Urine:4750]  Weight:   Wt Readings from Last 4 Encounters:   12/21/22 93.1 kg (205 lb 4 oz)   12/21/22 90.4 kg (199 lb 4.7 oz)   11/23/22 91.4 kg (201 lb 6.4 oz)   10/26/22 92.4 kg (203 lb 12.8 oz)     Vitals:    12/24/22 0700   BP: 111/66   Pulse: 79   Resp: 22   Temp:    SpO2: 98%      General: in no distress.  Alert and oriented x 3  Eyes: No xanthelasma.  Oropharynx: No mucosal pallor.  Neck: Trachea midline, JVD elevated to 8cm H2O. R IJ PAC    Pulmonary: Crackles bilaterally.  Cardiovascular: PMI nondisplaced with normal rate and rhythm, normal S1, normal S2, no murmur, rub, or gallop appreciated.  Abdomen: Soft, nontender, mildly distended  Extremities: 1+ edema, no clubbing, no cyanosis.  Skin: Cool to touch   Psych: Mood was pleasant     Laboratory:      Labs:  CBC        12/24/22  0301 12/23/22  0334 12/21/22  2247   WBC 6.13 7.18 13.69*   Hgb 10.8* 10.8* 12.1*   Hematocrit 33.6* 33.5* 38.2   Platelets 69* 81* 110*     BMP        12/24/22  0301 12/23/22  2109 12/23/22  1602 12/22/22  0424 12/21/22  2247 12/21/22  1837 12/20/22  2038 12/20/22  2034   Sodium 132* 133* 133*   < > 135 135   < > 142   Potassium 4.1 3.9 4.1   < > 4.9 4.8   < > 4.5   Chloride 99 99 100   < > 101 103   < > 106   CO2 '21 22 24   '$ < > 17 19   < > 17   BUN 46.0* 49.0* 50.0*   < > 61.0* 60.0*   < > 48.0*   Creatinine 2.6* 2.8* 2.8*   < > 4.2* 4.1*   < > 3.0*  Creatinine I-Stat  --   --   --   --   --   --    < >  --    eGFR 24.8* 22.7* 22.7*   < > 13.9* 14.3*   < > 20.9*   Calcium 8.5 8.3 8.3   < > 8.7 8.9   < > 10.1    Magnesium 2.2 2.2 2.3   < > 2.9* 2.8*   < > 2.9*   Phosphorus  --   --   --   --  7.2* 7.4*  --   --    Lactic Acid 1.1 1.6 2.2*   < > 2.2* 1.8   < >  --    NT-proBNP  --   --   --   --  [id number]*  --   --  [id number]*    < > = values in this interval not displayed.       LFTs        12/24/22  0301 12/23/22  2109 12/23/22  1602 12/22/22  0424 12/21/22  2247   AST (SGOT) 80* 88* 91*   < > 426*   ALT 257* 273* 280*   < > 425*   Alkaline Phosphatase 81 75 74   < > 81   Bilirubin, Total 1.2 1.2 1.1   < > 1.8*   Bilirubin Direct  --   --   --   --  0.9*   Bilirubin Indirect  --   --   --   --  0.9    < > = values in this interval not displayed.       Coags        12/24/22  0301 12/23/22  1713 12/23/22  1200 12/23/22  0334 12/22/22  0424 12/21/22  2247   PT  --  17.1*  --   --   --  29.0*   PT INR  --  1.5*  --   --   --  2.5*   PTT  --  33 63* 68*   < > 32   Anti-Xa, UFH 0.52  --   --   --   --  1.21*    < > = values in this interval not displayed.       ABGs        Microbiology  Microbiology Results (last 15 days)       Procedure Component Value Units Date/Time    MRSA culture [id number] Collected: 12/23/22 1200    Order Status: Sent Specimen: Culturette from Nasal Swab Updated: 12/23/22 1539    MRSA culture [id number] Collected: 12/23/22 1200    Order Status: Sent Specimen: Culturette from Throat Updated: 12/23/22 1539    Culture Blood Aerobic and Anaerobic [id number] Collected: 12/22/22 2239    Order Status: Completed Specimen: Blood, Venipuncture Updated: 12/24/22 [id number]    Narrative:      The order will result in two separate 8-46m bottles  Please do NOT order repeat blood cultures if one has been  drawn within the last 48 hours  UNLESS concerned for  endocarditis  AVOID BLOOD CULTURE DRAWS FROM CENTRAL LINE IF POSSIBLE  Indications:->Pneumonia  ORDER#: [id number]                                   ORDERED BY: GVear Clock SOURCE: Blood, Venipuncture  COLLECTED:  12/22/22 22:39  ANTIBIOTICS  AT COLL.:                                RECEIVED :  12/23/22 02:00  Culture Blood Aerobic and Anaerobic        PRELIM      12/24/22 02:27  12/24/22   No Growth after 1 day/s of incubation.      Culture Blood Aerobic and Anaerobic [id number] Collected: 12/22/22 2239    Order Status: Completed Specimen: Blood, Venipuncture Updated: 12/24/22 [id number]    Narrative:      The order will result in two separate 8-19m bottles  Please do NOT order repeat blood cultures if one has been  drawn within the last 48 hours  UNLESS concerned for  endocarditis  AVOID BLOOD CULTURE DRAWS FROM CENTRAL LINE IF POSSIBLE  Indications:->Pneumonia  ORDER#: [id number]                                   ORDERED BY: GVear Clock SOURCE: Blood, Venipuncture                          COLLECTED:  12/22/22 22:39  ANTIBIOTICS AT COLL.:                                RECEIVED :  12/23/22 01:59  Culture Blood Aerobic and Anaerobic        PRELIM      12/24/22 02:27  12/24/22   No Growth after 1 day/s of incubation.      Respiratory Pathogen Panel w/COVID-19, PCR [[id number]    Order Status: Canceled     COVID-19 (SARS-CoV-2) and Influenza A/B, NAA (Liat Rapid)- Admission [[id number]: 12/21/22 1050    Order Status: Completed Specimen: Culturette from Nasopharyngeal Updated: 12/21/22 1144     Purpose of COVID testing Diagnostic -[facility]I     SARS-CoV-2 Specimen Source Nasal Swab     SARS CoV 2 Overall Result Not Detected     Comment: __________________________________________________  -A result of "Detected" indicates POSITIVE for the    presence of SARS CoV-2 RNA  -A result of "Not Detected" indicates NEGATIVE for the    presence of SARS CoV-2 RNA  __________________________________________________________  Test performed using the Roche cobas Liat SARS-CoV-2 assay. This assay is  only for use under the Food and Drug Administration's Emergency Use  Authorization. This is a real-time RT-PCR assay for the qualitative  detection of SARS-CoV-2 RNA.  Viral nucleic acids may persist in vivo,  independent of viability. Detection of viral nucleic acid does not imply the  presence of infectious virus, or that virus nucleic acid is the cause of  clinical symptoms. Negative results do not preclude SARS-CoV-2 infection and  should not be used as the sole basis for diagnosis, treatment or other  patient management decisions. Negative results must be combined with  clinical observations, patient history, and/or epidemiological information.  Invalid results may be due to inhibiting substances in the specimen and  recollection should occur. Please see Fact Sheets for patients and providers  located:  hhttps://www.benson-chung.com/         Influenza A Not Detected     Influenza B Not Detected     Comment:  Test performed using the Roche cobas Liat SARS-CoV-2 & Influenza A/B assay.  This assay is only for use under the Food and Drug Administration's  Emergency Use Authorization. This is a multiplex real-time RT-PCR assay  intended for the simultaneous in vitro qualitative detection and  differentiation of SARS-CoV-2, influenza A, and influenza B virus RNA. Viral  nucleic acids may persist in vivo, independent of viability. Detection of  viral nucleic acid does not imply the presence of infectious virus, or that  virus nucleic acid is the cause of clinical symptoms. Negative results do  not preclude SARS-CoV-2, influenza A, and/or influenza B infection and  should not be used as the sole basis for diagnosis, treatment or other  patient management decisions. Negative results must be combined with  clinical observations, patient history, and/or epidemiological information.  Invalid results may be due to inhibiting substances in the specimen and  recollection should occur. Please see Fact Sheets for patients and providers  located: http://olson-hall.info/.         Narrative:      o Collect and clearly label specimen type:  o PREFERRED-Upper  respiratory specimen: One Nasal Swab in  Transport Media.  o Hand deliver to laboratory ASAP  Diagnostic -[facility]I    Respiratory Pathogen Panel w/COVID-19, PCR [id number] Collected: 12/21/22 0106    Order Status: Canceled Specimen: Nasopharyngeal     Culture Blood Aerobic and Anaerobic [id number] Collected: 12/21/22 0021    Order Status: Completed Specimen: Blood, Venipuncture Updated: 12/24/22 0426    Narrative:      Indications:->Other  Other->infection  ORDER#: [id number]                                    ORDERED BY: LEE, SIEW MEI  SOURCE: Blood, Venipuncture RAC                      COLLECTED:  12/21/22 00:21  ANTIBIOTICS AT COLL.:                                RECEIVED :  12/21/22 04:06  Culture Blood Aerobic and Anaerobic        PRELIM      12/24/22 04:25  12/22/22   No Growth after 1 day/s of incubation.  12/23/22   No Growth after 2 day/s of incubation.  12/24/22   No Growth after 3 day/s of incubation.      Culture Blood Aerobic and Anaerobic [id number] Collected: 12/21/22 0021    Order Status: Completed Specimen: Blood, Venipuncture Updated: 12/23/22 1055    Narrative:      [id number] called Micro Results of Pos Bld. Results read back by:[id number], by [id number]  on 12/22/2022 at 07:29  Indications:->Other  Other->infection  ORDER#: [id number]                                    ORDERED BY: LEE, SIEW MEI  SOURCE: Blood, Venipuncture LAC                      COLLECTED:  12/21/22 00:21  ANTIBIOTICS AT COLL.:  RECEIVED :  12/21/22 04:06  [id number] called Micro Results of Pos Bld. Results read back by:[id number], by [id number] on 12/22/2022 at 07:29  Culture Blood Aerobic and Anaerobic        PRELIM      12/23/22 10:55   +  12/22/22   No Growth after 1 day/s of incubation.             Aerobic Blood Culture Positive in 24 to 48 hours             Gram Stain Shows: Gram positive cocci in clusters             Identification to follow  12/23/22   Anaerobic culture no growth to date, final report to  follow  12/23/22   Growth of Staphylococcus (coagulase negative)               Possible skin contaminant, susceptibility testing not             performed without request unless additional blood cultures,             collected within 48 hours of this culture, become positive.             Contact the laboratory for further information if necessary.        MRSA culture [id number] Collected: 12/21/22 0000    Order Status: Canceled Specimen: Culturette from Nasal Swab     MRSA culture [id number] Collected: 12/21/22 0000    Order Status: Canceled Specimen: Culturette from Throat

## 2022-12-23 NOTE — Plan of Care (Signed)
CICU RN Progress Note:     Shift Events:  -PA cath   -Dobutamine, amiodarone gtt's continued  -Weaned from HFNC to room air   -Up in chair  -Wife updated at bedside    Care Plan:   Problem: Compromised Activity/Mobility  Goal: Activity/Mobility Interventions  Outcome: Progressing  Flowsheets (Taken 12/23/2022 1548)  Activity/Mobility Interventions: Pad bony prominences, TAP Seated positioning system when OOB, Promote PMP, Reposition q 2 hrs / turn clock, Offload heels     Problem: Compromised Nutrition  Goal: Nutrition Interventions  Outcome: Progressing  Flowsheets (Taken 12/23/2022 1548)  Nutrition Interventions: Discuss nutrition at Rounds, I&Os, Document % meal eaten, Daily weights     Problem: Moderate/High Fall Risk Score >5  Goal: Patient will remain free of falls  Outcome: Progressing  Flowsheets (Taken 12/23/2022 1548)  VH High Risk (Greater than 13):   ALL REQUIRED LOW INTERVENTIONS   ALL REQUIRED MODERATE INTERVENTIONS   RED "HIGH FALL RISK" SIGNAGE     Problem: Hemodynamic Status: Cardiac  Goal: Stable vital signs and fluid balance  Outcome: Progressing  Flowsheets (Taken 12/23/2022 1548)  Stable vital signs and fluid balance:   Assess signs and symptoms associated with cardiac rhythm changes   Monitor lab values     Please see flowsheets and MAR for more detailed information.

## 2022-12-23 NOTE — Progress Notes (Signed)
[facility].  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  -CAD with past PCI   -Mild hyponatremia due to hypervolemia   -Non gap acidosis due to CKD                Plan:     -No need for RRT at this time   -recommend low rate lasix drip for a more predictable and stable diuresis   -supportive care   -please try to avoid contrast studies and nephrotoxins   -daily labs         [name], MD  Epic Secure Chat: direct or group "[facility]  [place]  Office - [phone]  ++++++++++++++++++++++++++++++++++++++++++++++++++++++++++++++  Chief Complaint:    Interval History:    Medications:  Scheduled Meds:  Current Facility-Administered Medications   Medication Dose Route Frequency    aspirin EC  81 mg Oral Daily    atorvastatin  40 mg Oral QHS    hydrALAZINE  25 mg Oral Q8H [place]    insulin glargine  10 Units Subcutaneous QAM    Or    insulin glargine  5 Units Subcutaneous QAM    insulin lispro  1-8 Units Subcutaneous Q4H [place]ley    isosorbide dinitrate  10 mg Oral TID - Nitrate Free Interval    niacin  500 mg Oral BID    piperacillin-tazobactam  4.5 g Intravenous Q8H    senna-docusate  1 tablet Oral Q12H [place]    vancomycin   Intravenous See Admin Instructions     Continuous Infusions:   amiodarone 0.5 mg/min (12/23/22 0800)    DOBUTamine 5 mcg/kg/min (12/23/22 0800)    heparin infusion 25,000 units/500 mL (VTE/Moderate Intensity) 15 Units/kg/hr (12/23/22 0800)     PRN Meds:acetaminophen, dextrose **OR** dextrose **OR** dextrose **OR** glucagon (rDNA), heparin (porcine), heparin (porcine),  phenol    Objective:  Vital signs in last 24 hours:  Temp:  [97.4 F (36.3 C)] 97.4 F (36.3 C)  Heart Rate:  [76-88] 80  Resp Rate:  [13-29] 21  BP: (92-123)/(58-71) 112/68  FiO2:  [40 %-45 %] 45 %  Intake/Output last 24 hours:    Intake/Output Summary (Last 24 hours) at 12/23/2022 0943  Last data filed at 12/23/2022 0800  Gross per 24 hour   Intake 2550.2 ml   Output 3950 ml   Net -1399.8 ml     Intake/Output this shift:  I/O this shift:  In: 58.6 [I.V.:58.6]  Out: -     Physical Exam:   Gen: Well developed, no acute distress   CV: S1 S2 N RRR, no edema   Chest: Good effort, CTAB    Labs:  Recent Labs   Lab 12/23/22  0334 12/22/22  2133 12/22/22  1505 12/22/22  0424 12/21/22  2247 12/21/22  1837   Glucose 248* 270* 168* 276* 199* 167*   BUN 59.0* 61.0* 59.0* 62.0* 61.0* 60.0*   Creatinine 3.1* 3.3* 3.4* 4.0* 4.2* 4.1*   Calcium 8.0 8.2 8.8 8.4 8.7 8.9   Sodium 133* 134* 137 132* 135 135   Potassium 3.9 4.1 3.8 4.9 4.9 4.8   Chloride 100 99 101 101 101 103   CO2 '24 25 23 19 17 19   '$ Albumin 2.8* 2.9* 3.1* 3.1* 3.1*  --    Phosphorus  --   --   --   --  7.2* 7.4*   Magnesium 2.4  --  2.6 2.8* 2.9* 2.8*     Recent Labs   Lab 12/23/22  0334 12/21/22  2247 12/21/22  0416   WBC 7.18 13.69* 13.49*   Hgb 10.8* 12.1* 13.9   Hematocrit 33.5* 38.2 44.2   MCV 103.7* 104.4* 102.3*   MCH 33.4 33.1 32.2   MCHC 32.2 31.7 31.4*   RDW 15 15 16*   MPV 12.7* 12.9* 12.6*   Platelets 81* 110* 159

## 2022-12-23 NOTE — Progress Notes (Signed)
Pt is taken off HFNC and placed 6 NC sating in high 90's/ breathing comfortably in mid 20's.

## 2022-12-23 NOTE — Progress Notes (Signed)
Cardiac Intensive Care Unit (CICU)  History and Physical        Patient Name: [name] [name]  MRN: [id number]  Room: FI102/FI102-01    Chief Complaint    Cardiogenic shock SCAI stage C/D    History of Present Illness       3/4: RHC placed.   3/5: Diuresis, good UOP.   3/6: Started vanc and zosyn. Blood culture positive, repeat sent. RML possible infiltrate on CXR. CI: 1.7 overnight, lactate cleared. Wife to bring in CLL medication.     Assessment & Plan   Assessment and Plan   Summary: [name] [name] a 77 y.o. male with PMHx NICM/ICM s/p Medtronik ICD (placed 2013, replaced 2018), CAD s/p PCI with DES 2011, T2DM, COPD, stage IV CLL, CKD, RLE DVT on Eliquis initially presented to [facility] ED with tachycardia, chest pain, found to be in SVT s/p unsuccessful cardioversion with return to NSR s/p adenosine. Since, started on dobutamine and transferred to [facility] for LVAD workup/evaluation, now status post right heart cath on 3/4, diuresing and optimizing cardiac index, aspiration event.    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 only with 600 mL out, will reassess and redose if needed at 2 PM.  Following  filling pressures, sodium, physical exam to guide diuresis  Afterload: continue home hydralazine 25 mg Q8H, and isordil 10 mg TID,  holding BB  MCS: None  Inotropy: dobutamine 5 mcg/kg/min, may consider increasing given cardiac index low  Device: Medtronik ICD  Latest TTE: 3/4 with EF 11%, severe global hypokinesis   Advanced therapies: was undergoing evaluation for LVAD, delayed by renal function worsening  Continue ASA 81 mg QD and Lipitor 40 mg QHS  heparin infusion, VTE protocol   Blood Pressure Goal: MAP > 65    CVP: 20, 16,  PAP 5/25,  SVR:987   CI: 2.24 (Fick) 1.6 (Therm)  SVO2:  0.59   PULMONARY:   Hx of COPD    Last vent settings if on vent:  FiO2:  [40 %-45 %] 45 %  Ventilator Time (if on vent):     Aspiration event last night with evening meal, concern for aspiration, speech eval pending vanc/Zosyn started  March 3rd CXR with mild pulmonary edema, improved aeration on most recent CXR  Now on supplemental oxygen as needed to keep sat > 90%  Diuresis as above  Add incentive spirometer  Attempt to wean from high flow to nasal cannula, respiratory toilet as needed   RENAL:   AKI on CKD likely from chronic cardiorenal progression      Intake/Output Summary (Last 24 hours) at 12/23/2022 0700  Last data filed at  12/23/2022 0600  Gross per 24 hour   Intake 2469.91 ml   Output 4450 ml   Net -1980.09 ml      Follow[facility] with [facility], consulted and appreciate recommendations  baseline serum creatinine about 2.5 mg/dL, Serum creatinine at 4 mg/dL, now down to 2.8 indicating better organ perfusion  Follow BMP and mag q6hr while diuresing   Avoid nephrotoxin  Promote bedside urinal     GASTROINTESTINAL:  Transaminitis in setting of cardioversion    Last Bowel Movement:   Last BM Date: 12/19/22 LFT's daily, improving  Nutrition: N.p.o. until SLP eval, otherwise cardiac diet, renal diet  Bowel Regimen: senna/docusate     INFECTIOUS DISEASE:   C/f aspiration  Positive BC grams stain  Leukocytosis, resolved    Temp (24hrs), Avg:97.4 F (36.3 C), Min:97.4 F (36.3 C), Max:97.4 F (36.3 C)     * No values recorded between 12/22/2022 12:25  PM and 12/23/2022  7:00 AM *  Lab Results   Component Value Date    WBC 7.18 12/23/2022      CAUTI Prevention (n/a if blank):  Foley Day:         Blood Steam Infection Prevention (n/a if blank):   Temporary Catheter with Pigtail 12/22/22 Internal Jugular Right  Pulmonary Artery Catheter 12/22/22 Internal jugular Right COVID/flu negative  Last culture dates: Blood cultures 3/4, 1 bottle positive for Gram stain of Gram-positive cocci in clusters, coag negative staph  No leukocytosis, afebrile, but with new oxygen requirement, overall not giving infectious picture  vanc/Zosyn started  MRSA nares, may de-escalate Vanco if negative  Possible that positive Gram stain is contaminant, however will cover as above  Checks x-ray with possible right lower lobe infiltrate, increased airspace haziness/vascular congestion compared to x-ray from last night could represent pulmonary edema versus developing pneumonia.    Formal speech eval, n.p.o. until then                 HEME/ONC:   Hx of RLE DVT on Eliquis  Hx of CLL, on acalabrutinib  Acutely worsening thrombocytopenia    heparin (porcine) injection 3,700 Units   heparin (porcine) injection 7,450 Units   heparin 25,000 units in dextrose 5% 500 mL infusion (premix)  heparin gtt holding  HIT assay, will start bivalirudin if indicated in afternoon, however if negative will reinitiate heparin  SCDs  Iron profile, type and screen, and anti Xa   Ferritin: 57, iron saturation low at 10, iron low at 32,   calculated total iron deficit 1200 using target hemoglobin of 14.    Initiate IV iron 200 over 5 days clinically appropriate  If proven to be overtly bacteremic will reconsider, however do not favor this scenario currently   ENDO/RHEUM:  T2DM  Sliding scale  HgbA1C within acceptable range given age  Blood glucose in the mid 200s above acceptable  Goal glucose range 100 - 180  Glargine 10 units daily, 5 while n.p.o.       Current Infusions (n/a if blank): dobutamine       Next of Kin  Updated: Open ACP Navigator   Primary Emergency Contact: Hailu,Octavia  Yes    Code Status: Full Code  DVT prophylaxis: Heparin GGT  Tubes lines and drains: Pulmonary artery catheter, peripheral IV  Diet: N.p.o. 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    Coronary artery disease 2011    CORONARY STENT PLACEMENT FOLLOWED BY [facility]    Depression     echocardiogram 02/2016, 11/2016, 09/2017, 12/20/2017    GERD (gastroesophageal reflux disease)     Heart attack 2011    and 2014    Hyperlipemia     Hypertension     ICD (implantable 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     PLACED 2014  [facility]  LAST INTERROGATION April 01 2018 REPORT REQUESTED    Ischemic cardiomyopathy     EF 15% ON ECHO 09-20-2017 DR GARG IN EPIC    Nonischemic cardiomyopathy     NSTEMI (non-ST elevated myocardial infarction)     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    Pacemaker 2014    MEDTRONIC ICD/PACEMAKER COMBO LAST INTERROGATION  12-12 2018    Pneumonia 09/2016    Primary cardiomyopathy     Ischemic cardiomyopathy EF 15% ON ECHO 09-20-2017 DR GARG IN EPIC    Sepsis 08/2017    Syncope and collapse     PRIOR TO ICD/PACEMAKER PLACED 2014    Type 2 diabetes mellitus, controlled DX 1998    BS  AVG 100  A1C 7.4 Dec 27 2017    [facility]     PT SEE HIS PMD April 01 2018 RE THIS-TOLD TO SEE PMD BY [facility] JUNE 12 WHEN THEY SAW HIM FOR A CL       Past Surgical History:     Past Surgical History:   Procedure Laterality  Date    BIV  11/10/2016    ICD [facility]  UPGRADE     CARDIAC CATHETERIZATION  03/2010    CORONARY STENT PLACEMENT X 2 PER PT; LM normal, LAD 70-80% distal lesion at apex, 20% lesion mid CFX    CARDIAC CATHETERIZATION  12/2012    CARDIAC DEFIBRILLATOR PLACEMENT  12/2012    [facility]    CARDIAC PACEMAKER PLACEMENT  2014    MEDTRONIC ICD/PACEMAKER PLACED [facility]    CIRCUMCISION  AGE 15    COLONOSCOPY  2009    CORRECTION HAMMER TOE      duodenal ulcer  1973    STRESS RELATED IN SCHOOL    ECHOCARDIOGRAM, TRANSTHORACIC  02/2016 11/2016 09/2017 12/2017    ECHOCARDIOGRAM, TRANSTHORACIC  02/2016,11/2016,12/20/2017    EGD N/A 08/15/2014    Procedure: EGD;  Surgeon: Loraine Maple, MD;  Location: [facility];  Service: Gastroenterology;  Laterality: N/A;  egd w/ bx    EGD  1975    EGD, COLONOSCOPY N/A 04/04/2018    Procedure: EGD with bxs, COLONOSCOPY with polypectomy and clipping;  Surgeon: Omer Jack, MD;  Location: [facility];  Service: Gastroenterology;  Laterality: N/A;    FRACTURE SURGERY  AGE 38     RT  ANKLE- CAST APPLIED    HERNIA REPAIR  AGE 89    LEFT INGUINAL HERNIA    MPI nuclear study  06/2016    orthopedic surgery  AGE 69    RT foot corrective - HAMMERTOE    RIGHT HEART CATH Right 09/09/2022    Procedure: Right Heart Cath;  Surgeon: Wandra Arthurs, MD;  Location: [facility];  Service: Cardiovascular;  Laterality: Right;  tx from [place]       Social History:     Social History     Socioeconomic History    Marital status: Married     Spouse name: Architect    Number of children: 0    Years of education: Not on file    Highest education level: Not on file   Occupational History    Occupation: Pharmacist, 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[employer], history    Tobacco Use    Smoking status: Never    Smokeless tobacco: Never   Vaping Use    Vaping Use: Never used   Substance and Sexual Activity    Alcohol use: Not Currently    Drug use: No    Sexual activity: Yes     Partners: Female    Other Topics Concern    Not on file   Social History Narrative    Not on file     Social Determinants of Health     Financial Resource Strain: Low Risk  (12/22/2022)    Overall Financial Resource Strain (CARDIA)     Difficulty of Paying Living Expenses: Not hard at all   Food Insecurity: No Food Insecurity (09/12/2022)    Hunger Vital Sign     Worried About Running Out of Food in the Last Year: Never true     Advance in the Last Year: Never true   Transportation Needs: No Transportation Needs (12/22/2022)    PRAPARE - Armed forces logistics/support/administrative officer (Medical): No     Lack of Transportation (Non-Medical): No   Physical Activity: Not on file   Stress: Not on file   Social Connections: Not on file   Intimate Partner Violence: Not At Risk (09/12/2022)    Humiliation, Afraid, Rape, and Kick questionnaire     Fear of Current or Ex-Partner: No     Emotionally Abused: No     Physically Abused: No     Sexually Abused: No   Housing Stability: Low Risk  (12/22/2022)    Housing Stability Vital Sign     Unable to Pay for Housing in the Last Year: No     Number of Places Lived in the Last Year: 1     Unstable Housing in the Last Year: No       Family History:     Family History   Problem Relation Age of Onset    Breast cancer Mother     Heart attack Mother 74    Hypertension Mother     Diabetes Mother     Coronary artery disease Mother     Diabetes Brother     Heart attack Father     Hypertension Father        Allergies:     Allergies   Allergen Reactions    Allopurinol       Home Medications:     Home Medications               apixaban (ELIQUIS) 5 MG     Take 1 tablet (5  mg) by mouth every 12 (twelve) hours For 1 week followed by 1 tablet twice daily     aspirin EC 81 MG EC tablet     Take 1 tablet (81 mg) by mouth daily     atorvastatin (LIPITOR) 40 MG tablet     Take 1 tablet (40 mg total) by mouth nightly     Notes:  PLEASE ADVISE PATIENT TO SCHEDULE OVERDUE FOLLOW-UP WITH GENERAL CARDIOLOGY (DR. Camille Bal) FOR  CONTINUED MANAGEMENT/REFILLS.     Calquence 100 MG Tab          hydrALAZINE (APRESOLINE) 25 MG tablet (Expired)     Take 1 tablet (25 mg) by mouth every 8 (eight) hours     Insulin Glargine-yfgn (Semglee, yfgn,) 100 UNIT/ML Solution Pen-injector     50 Units by Subdermal route daily     isosorbide dinitrate (ISORDIL) 10 MG tablet (Expired)     Take 1 tablet (10 mg) by mouth 3 (three) times daily     Jardiance 10 MG tablet     TAKE 1 TABLET(10 MG) BY MOUTH DAILY     loratadine (CLARITIN) 10 MG tablet     Take 1 tablet (10 mg) by mouth daily     metoprolol succinate XL (TOPROL-XL) 25 MG 24 hr tablet     Take 0.5 tablets (12.5 mg) by mouth daily     niacin (NIASPAN) 500 MG CR tablet     TAKE 1 TABLET BY MOUTH TWICE DAILY     Patient taking differently: 250 mg Taking half a tablet     torsemide (DEMADEX) 20 MG tablet     Take 2 tablets (40 mg) by mouth 2 (two) times daily                         Examination:   Vitals Temp:  [97.4 F (36.3 C)]   Heart Rate:  [76-88]   Resp Rate:  [12-29]   BP: (92-123)/(54-71)   SpO2:  [88 %-99 %]   Height:  [182.9 cm (6' 0.01")]  // Temperature with 24 range    Neuro:    Non-focal neuro exam alert & oriented x 3     Lungs:   clear to auscultation, no wheezes, rales or rhonchi, symmetric air entry     Cardiac:   normal rate, regular rhythm, normal S1, S2, no murmurs, rubs, clicks or gallops     Abdomen:    soft, nontender, nondistended, no masses or organomegaly     Extremities:   peripheral pulses normal    Skin:     Warm, dry and intact Fick/Cardiac Hemodynamics   VO2 Determination  Height: 182.9 cm (6' 0.01")  Weight FICK (only): 93.1 kg (205 lb 4 oz)  BSA (Fick only) (Calculated - sq m): 2.17 sq meters  VO2: 271.25    CI (L/min/m2): 1.6 L/min/m2    CO/CI Determination  SaO2 : 0.97  SvO2 : 0.59  SaO2 - SvO2: 0.38  HGB Result: 10.8  x 1.36 x 10: 55.81  Cardiac Output (FICK): 4.86  Cardiac Index (FICK): 2.24  Cardiac Power Output: 0.85    PVR Determination  PAP: 53/24  PAP (Mean): 33  mmHg  Pulmonary Artery Pulsatility Index (PAPI): 1.81    SVR Determination  MAP (mmHg): 82  CVP (mmHg): 16 mmHg  MAP - CVP: 66  SVR (dynes/second/cm3): 987.65 dynes/second/cm3         Most Recent Labs  7.18 \ 10.8* / 81*    /  33.5* \    03/06 0334    133* 100 59.0* / 248*   3.9 24 3.1* \    03/06 [place], DO  12/23/2022 7:00 AM     {

## 2022-12-23 NOTE — SLP Eval Note (Signed)
[facility]   Speech and Language Therapy Bedside Swallow Evaluation     Patient: [name] [name]    MRN#: [id number]     Consult received for [name] [name] for SLP Bedside Swallow Evaluation and Treatment.    Plan/Recommendations:   Diet Solids Recommendation: regular  Liquid Recommendations:  thin consistency  Recommended Form of Meds: PO, whole, with liquid, with puree  Suggestions for Feeding: distant supervision (monitor for adequate alertness / ensure use of aspiration precautions)  Precautions/Compensations: Awake/alert;Upright 90 degrees for all oral intake;Small bites/sips;Eat/feed slowly  Recommendation Discussed With: : Patient;Physician;Nurse;Family/Caregiver        Plan: begin/continue oral diet, dysphagia treatment, patient/family education  Follow up treatments: diet monitoring     SLP Frequency Recommended: follow-up visit only    Discharge recommendations: Defer to PT/OT recommendation    Assessment:   Patient seen for bedside swallow evaluation s/p choking event on regular solid when patient became drowsy during meal s/p procedure (per RN). At this time, patient awake/alert with good participation in oral trials. OME reveals functional oral motor skills; clear vocal quality and strong volitional throat clear noted prior to oral intake. Adequate oral phase of swallow noted across all consistencies trialed. Consistent hyolaryngeal rise noted via manual palpation. No overt s/s of aspiration noted during administration of trials across consistencies.  Patient with 1x instance of dry cough ~3-5 minutes following final trial of solid. Cannot completely r/u as delayed s/s of aspiration, though suspect swallow function is WNL. Recommend regular solids / thin liquid diet only when awake/alert with good oral care and strict aspiration precautions. SLP to follow for diet tolerance.       Therapy Diagnosis: Dysphagia    History of Present Illness:   Current Hospitalization:  Referring  Physician: Gwenyth Bouillon, NP   Date of Referral: 12/23/22     History of Present Illness: [name] [name] is a 77 y.o. male admitted on 12/21/2022. 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  Found to be in SVT with HR 158 BPM, received unsuccessful cardioversion at 120 J.  Subsequently given adenosine 6 mg IV x 1, rhythm slowed and was found to be in atrial sensed ventricular paced rhythm with rate less than 100 BPM. Admitted to ICU at [facility] for further management. However, then transferred to [place] on new Dobutamine 2.5 mcg/kg/min for LVAD consideration.   Of note follows with [place] [facility]. Has been followed by [facility] for advanced therapies, including LVAD where he has been seen by Dr. Alain Marion. Home medications include Eliquis 2.5 mg BID, ASA 81 mg QD, Lipitor 40 mg QHS, Jardiance 10 mg QD, Hydralazine 25 mg Q8H, Isosorbide Dinitrate 10 mg TID, metoprolol succinate XL 12. 5 mg QD, torsemide 40 mg BID, Lantus 50U QD. Was previously on Aldactone but discontinued given renal function. Dry weight is around 185 lb per 10/2022, weight today around 195-199 lb.  Follows with Dr. Mervin Kung of [facility] for CLL. Dx'd in 06/2020. Thought to be well controlled on acalabrutinib.  Follows with [facility] for CKD. Cr increased from 1.5 in 03/2022 to 2.3 in 09/2022. Previously on HD/UF in 2018. "    Imaging:   X-ray chest AP portable  Result Date: 12/23/2022  Improved  inspiratory effort with mild dependent atelectasis [name], MD 12/23/2022 8:16 AM    XR Chest AP Portable  Result Date: 12/23/2022   Airspace disease in the medial right lower lobe. Blanchard Mane 12/23/2022 12:46 AM    XR Chest AP Portable  Result Date: 12/22/2022  1. Right Swan-Ganz catheter tip in the proximal right lower  pulmonary artery. No pneumothorax. 2. Mild vascular congestion, small right pleural effusion and right basilar atelectasis. Ardelia Mems, MD 12/22/2022 4:59 PM    Chest AP Portable  Result Date: 12/20/2022  No acute intrathoracic process. 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    Coronary artery disease 2011    CORONARY STENT PLACEMENT FOLLOWED BY [facility]    Depression     echocardiogram 02/2016, 11/2016, 09/2017, 12/20/2017    GERD (gastroesophageal reflux disease)     Heart attack 2011    and 2014    Hyperlipemia     Hypertension     ICD (implantable 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     PLACED 2014  [place]  LAST INTERROGATION April 01 2018 REPORT REQUESTED    Ischemic cardiomyopathy     EF 15% ON ECHO 09-20-2017 DR GARG IN EPIC    Nonischemic cardiomyopathy     NSTEMI (non-ST elevated myocardial infarction)     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    Pacemaker 2014    MEDTRONIC ICD/PACEMAKER COMBO LAST INTERROGATION  12-12 2018    Pneumonia 09/2016    Primary cardiomyopathy     Ischemic cardiomyopathy EF 15% ON ECHO 09-20-2017 DR GARG IN EPIC    Sepsis 08/2017    Syncope and collapse     PRIOR TO ICD/PACEMAKER PLACED 2014    Type 2 diabetes mellitus, controlled DX 1998    BS  AVG 100  A1C 7.4 Dec 27 2017    [place]     PT SEE HIS PMD April 01 2018 RE THIS-TOLD TO SEE PMD BY [facility] JUNE 12 WHEN THEY SAW HIM FOR A CL      Past Surgical History:   Procedure Laterality Date     BIV  11/10/2016    ICD [place]  UPGRADE     CARDIAC CATHETERIZATION  03/2010    CORONARY STENT PLACEMENT X 2 PER PT; LM normal, LAD 70-80% distal lesion at apex, 20% lesion mid CFX    CARDIAC CATHETERIZATION  12/2012    CARDIAC DEFIBRILLATOR PLACEMENT  12/2012    [facility]    CARDIAC PACEMAKER PLACEMENT  2014    MEDTRONIC ICD/PACEMAKER PLACED [facility]    CIRCUMCISION  AGE 40    COLONOSCOPY  2009    CORRECTION HAMMER TOE      duodenal ulcer  1973    STRESS RELATED IN SCHOOL    ECHOCARDIOGRAM,  TRANSTHORACIC  02/2016 11/2016 09/2017 12/2017    ECHOCARDIOGRAM, TRANSTHORACIC  02/2016,11/2016,12/20/2017    EGD N/A 08/15/2014    Procedure: EGD;  Surgeon: Loraine Maple, MD;  Location: [facility];  Service: Gastroenterology;  Laterality: N/A;  egd w/ bx    EGD  1975    EGD, COLONOSCOPY N/A 04/04/2018    Procedure: EGD with bxs, COLONOSCOPY with polypectomy and clipping;  Surgeon: Omer Jack, MD;  Location: [facility];  Service: Gastroenterology;  Laterality: N/A;    FRACTURE SURGERY  AGE 89     RT  ANKLE- CAST APPLIED    HERNIA REPAIR  AGE 41    LEFT INGUINAL HERNIA    MPI nuclear study  06/2016    orthopedic surgery  AGE 13    RT foot corrective - [facility]    RIGHT HEART CATH Right 09/09/2022    Procedure: Right Heart Cath;  Surgeon: Wandra Arthurs, MD;  Location: [facility];  Service: Cardiovascular;  Laterality: Right;  tx from [place]         History/Current Status:  Respiratory Status: O2 via nasal cannula (6L)  Behavior/Mental Status: Awake/alert;Able to follow directions;Cooperative  Nutrition: NPO    Speech Therapy History unremarkable       Subjective:   Patient is agreeable to participation in the therapy session. Family and/or guardian are agreeable to patient's participation in the therapy session. Nursing clears patient for therapy. Patient's medical condition is appropriate for Speech therapy intervention at this time.    Objective:    Observation of Patient/Vital Signs:  Patient is in bed with NC, dressings, and telemetry in place.  Interpreter services required: N/A  Precautions:  bleeding     Oral Assessment:  Oral Assessment  Mucous Membranes: Pink and moist with firm gums  Lips/Corners of Mouth: Smooth/pink/moist  Tongue: Pink and moist  Saliva/Dry Mouth: WNL  Dentition: Adequate     Oral Motor Skills:  Oral Motor Skills  Oral Motor Skills: within functional limits (slight lingual deviation R when holding at midline)    Deglutition Skills:  Deglutition Skills  Position: upright 90 degrees  Food(s) Tested: thin liquid;ice chips;via spoon;via cup;via straw;puree;solid  Oral Stage: adequate       Oral Stage Residuals: Lingual  Lingual Residuals: Cleared;Liquid bolus/wash assist  Pharyngeal Stage: adequate (Of note: Dry cough x 1 instance noted ~3-5 mnutes after final trial (reg solid). Cannot completely r/o as s/s of aspiration, though suspect swallow function WFL)  Esophageal Stage: appears WFL    Patient left with call bell within reach, all needs met, and all questions answered. RN notified of session outcome and patient response.     Educated the patient to role of speech therapy, plan of care, goals of therapy.    Goals:   Patient will demonstrate effective oral phase skills without s/s of aspiration on a diet of regular / thin for 24-48 hours. NEW    PPE worn by provider: procedural mask and gloves  Lavonia Drafts, M.S.Ed., CCC-SLP  Vanetta Shawl, M.S., CCC-SLP    Time of Treatment:  SLP Received On: 12/23/22  Start Time: 1215  Stop Time: 1230  Time Calculation (min): 15 min

## 2022-12-23 NOTE — Progress Notes (Signed)
Contact patient seen and examined. Agree with assessment and plan as per ICU team note with the following additions/caveats:    77 year old man history of nonischemic and ischemic cardiomyopathy, prior Medtronic ICD, coronary disease status post prior PCI with drug-eluting stent 2011.  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  Initially presented to [facility] with tachycardia, chest discomfort found in SVT.  Patient underwent cardioversion x 1, adenosine with conversion to normal sinus rhythm following.  Patient was initiated on dobutamine therapy for acute decompensated heart failure, transferred to [facility] for LVAD workup and evaluation previously underway.      Fick/Cardiac Hemodynamics   VO2 Determination  Height: 182.9 cm (6' 0.01")  Weight FICK (only): 93.1 kg (205 lb 4 oz)  BSA (Fick only) (Calculated - sq m): 2.17 sq meters  VO2: 271.25    CO/CI Determination  SaO2 : 0.99  SvO2 : 0.68  SaO2 - SvO2: 0.31  HGB Result: 10.6  x 1.36 x 10: 44.69  Cardiac Output (FICK): 6.07  Cardiac Index (FICK): 2.8  Cardiac Power Output: 1.09    PVR Determination  PAP: 48/21  PAP (Mean): 36 mmHg  Pulmonary Artery Pulsatility Index (PAPI): 1.35    SVR Determination  MAP (mmHg): 81  CVP (mmHg): 20 mmHg  MAP - CVP: 68  SVR (dynes/second/cm3): 883.03 dynes/second/cm3          Overnight Events:   Right heart catheterization, elevated filling pressures  Vomiting, concern for aspiration, started on HFNC  1/2 blood culture with GPC, coag negative staph. 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 Trend Fick and thermodilution  Continue dobutamine, dose increased to 5 mcg/kg/min  Amiodarone bolus and infusion for ectopy  Interrogation of ICD completed  Aggressive diuresis, lasix 160 IV again, consider infusion for ongoing diuresis and negative fluid balance, reassess based on filling pressures  Continue ASA, lipitor, hydralazine, isordil  Nephrology following, with baseline CKD  Check HIT/SRA, transition heparin infusion to bivalirudin pending assay  LVAD workup underway  Insulin glargine, sliding scale insulin as needed to maintain glucose less than 180  Hold CLL medication [place] at this time with thrombocytopenia concern.   SLP evaluation, PT/OT and activity as tolerated    Lines/tubes/foley:  Right IJ venous sheath, PA catheter 3/5  PIV    CODE STATUS: Full code    DVT prophylaxis: Heparin infusion transition to bivalirudin, history of DVT  GI prophylaxis: not Indicated    Nutrition started within 24 hours: Yes    Discussed in detail with bedside nurse, ICU team, CICU cardiologist Dr. Merrilyn Puma, discussed with patient and his wife at bedside.     This patient has a high probability of sudden clinically significant deterioration which requires the highest level of physician preparedness to intervene urgently. I managed/supervised life or  organ supporting interventions that required frequent physician assessments.   I devoted my full attention to the direct care of this patient for this period of time.      Organ systems which are failing and require intensive, critical care support are:   Invasive hemodynamic monitoring with PA catheter  IV inotropic support     Any  critical care time was performed today and is exclusive of teaching, billable procedures, and not overlapping with any other providers.     Critical care time:35 minutes

## 2022-12-23 NOTE — Plan of Care (Incomplete)
Events - -      Neuro: aox4; afebrile; denies pain; moves all extremities and follows commands  Cardiac:  NSR on monitor w/ frequent PVCs; continued on dobutamine and amio gtt; PA 50s-60s/20s w PA means in 30s; CVP in 15-20s; MAPs above 65  Respiratory: on room air  GI/GU: refused senna this evening; uses urinal  Endo:   Skin: scattered bruising and scars  Fall Score: high; x1 - 2 assist when ambulating     Lines/Drains:  - PAC w venous sheat in RIJ  - multiple PIVs     Comments:   - started on heparin gtt at beginning of shift     Plan:   - continued on dobutamine, amio, and heparin gtt  - monitor I/Os  - encourage use of IS    Problem: Moderate/High Fall Risk Score >5  Goal: Patient will remain free of falls  Outcome: Progressing  Flowsheets (Taken 12/23/2022 2028)  High (Greater than 13):   HIGH-Bed alarm on at all times while patient in bed   HIGH-Visual cue at entrance to patient's room   HIGH-Apply yellow "Fall Risk" arm band     Problem: Hemodynamic Status: Cardiac  Goal: Stable vital signs and fluid balance  Outcome: Progressing  Flowsheets (Taken 12/23/2022 2028)  Stable vital signs and fluid balance:   Assess signs and symptoms associated with cardiac rhythm changes   Monitor lab values     Problem: Inadequate Gas Exchange  Goal: Adequate oxygenation and improved ventilation  Outcome: Progressing  Flowsheets (Taken 12/23/2022 2028)  Adequate oxygenation and improved ventilation:   Assess lung sounds   Monitor SpO2 and treat as needed   Plan activities to conserve energy: plan rest periods   Increase activity as tolerated/progressive mobility

## 2022-12-24 ENCOUNTER — Inpatient Hospital Stay: Payer: Commercial Managed Care - POS

## 2022-12-24 DIAGNOSIS — I428 Other cardiomyopathies: Secondary | ICD-10-CM | POA: Insufficient documentation

## 2022-12-24 LAB — CBC WITH MANUAL DIFFERENTIAL
Absolute NRBC: 0.03 10*3/uL — ABNORMAL HIGH (ref 0.00–0.00)
Absolute NRBC: 0.04 10*3/uL — ABNORMAL HIGH (ref 0.00–0.00)
Band Neutrophils Absolute: 0 10*3/uL (ref 0.00–1.00)
Band Neutrophils Absolute: 0.16 10*3/uL (ref 0.00–1.00)
Band Neutrophils: 0 %
Band Neutrophils: 3 %
Basophils Absolute Manual: 0 10*3/uL (ref 0.00–0.08)
Basophils Absolute Manual: 0 10*3/uL (ref 0.00–0.08)
Basophils Manual: 0 %
Basophils Manual: 0 %
Cell Morphology: ABNORMAL — AB
Cell Morphology: ABNORMAL — AB
Eosinophils Absolute Manual: 0.11 10*3/uL (ref 0.00–0.44)
Eosinophils Absolute Manual: 0.05 10*3/uL (ref 0.00–0.44)
Eosinophils Manual: 2 %
Eosinophils Manual: 1 %
Hematocrit: 33.2 % — ABNORMAL LOW (ref 37.6–49.6)
Hematocrit: 33.7 % — ABNORMAL LOW (ref 37.6–49.6)
Hgb: 10.8 g/dL — ABNORMAL LOW (ref 12.5–17.1)
Hgb: 11.1 g/dL — ABNORMAL LOW (ref 12.5–17.1)
Lymphocytes Absolute Manual: 1.48 10*3/uL (ref 0.42–3.22)
Lymphocytes Absolute Manual: 0.97 10*3/uL (ref 0.42–3.22)
Lymphocytes Manual: 27 %
Lymphocytes Manual: 18 %
MCH: 32.9 pg (ref 25.1–33.5)
MCH: 33.6 pg — ABNORMAL HIGH (ref 25.1–33.5)
MCHC: 32.5 g/dL (ref 31.5–35.8)
MCHC: 32.9 g/dL (ref 31.5–35.8)
MCV: 101.2 fL — ABNORMAL HIGH (ref 78.0–96.0)
MCV: 102.1 fL — ABNORMAL HIGH (ref 78.0–96.0)
MPV: 12.1 fL (ref 8.9–12.5)
MPV: 12.9 fL — ABNORMAL HIGH (ref 8.9–12.5)
Monocytes Absolute: 0.11 10*3/uL — ABNORMAL LOW (ref 0.21–0.85)
Monocytes Absolute: 0.05 10*3/uL — ABNORMAL LOW (ref 0.21–0.85)
Monocytes Manual: 2 %
Monocytes Manual: 1 %
Neutrophils Absolute Manual: 3.79 10*3/uL (ref 1.10–6.33)
Neutrophils Absolute Manual: 4.13 10*3/uL (ref 1.10–6.33)
Nucleated RBC: 0.5 /100 WBC — ABNORMAL HIGH (ref 0.0–0.0)
Nucleated RBC: 0.7 /100 WBC — ABNORMAL HIGH (ref 0.0–0.0)
Platelet Estimate: DECREASED — AB
Platelet Estimate: DECREASED — AB
Platelets: 64 10*3/uL — ABNORMAL LOW (ref 142–346)
Platelets: 76 10*3/uL — ABNORMAL LOW (ref 142–346)
RBC: 3.28 10*6/uL — ABNORMAL LOW (ref 4.20–5.90)
RBC: 3.3 10*6/uL — ABNORMAL LOW (ref 4.20–5.90)
RDW: 16 % — ABNORMAL HIGH (ref 11–15)
RDW: 16 % — ABNORMAL HIGH (ref 11–15)
Segmented Neutrophils: 69 %
Segmented Neutrophils: 77 %
WBC: 5.49 10*3/uL (ref 3.10–9.50)
WBC: 5.37 10*3/uL (ref 3.10–9.50)

## 2022-12-24 LAB — ANTI-XA,UFH
Anti-Xa, UFH: 0.52 IU/mL
Anti-Xa, UFH: 0.59 IU/mL

## 2022-12-24 LAB — COOXIMETRY PROFILE
Carboxyhemoglobin: 1.5 % (ref 0.0–3.0)
Carboxyhemoglobin: 1.7 % (ref 0.0–3.0)
Carboxyhemoglobin: 1.7 % (ref 0.0–3.0)
Carboxyhemoglobin: 1.8 % (ref 0.0–3.0)
Hematocrit Total Calculated: 32.9 % — ABNORMAL LOW (ref 40.0–54.0)
Hematocrit Total Calculated: 33.1 % — ABNORMAL LOW (ref 40.0–54.0)
Hematocrit Total Calculated: 32.5 % — ABNORMAL LOW (ref 40.0–54.0)
Hematocrit Total Calculated: 32.8 % — ABNORMAL LOW (ref 40.0–54.0)
Hemoglobin Total: 10.7 g/dL — ABNORMAL LOW (ref 13.0–17.0)
Hemoglobin Total: 10.7 g/dL — ABNORMAL LOW (ref 13.0–17.0)
Hemoglobin Total: 10.5 g/dL — ABNORMAL LOW (ref 13.0–17.0)
Hemoglobin Total: 10.6 g/dL — ABNORMAL LOW (ref 13.0–17.0)
Methemoglobin: 0.5 % (ref 0.0–3.0)
Methemoglobin: 0.6 % (ref 0.0–3.0)
Methemoglobin: 0.5 % (ref 0.0–3.0)
Methemoglobin: 1 % (ref 0.0–3.0)
O2 Content: 8.4 Vol %
O2 Content: 8.8 Vol %
O2 Content: 8.7 Vol %
O2 Content: 9.5 Vol %
Oxygenated Hemoglobin: 56.1 % — ABNORMAL LOW (ref 85.0–98.0)
Oxygenated Hemoglobin: 58.7 % — ABNORMAL LOW (ref 85.0–98.0)
Oxygenated Hemoglobin: 58.7 % — ABNORMAL LOW (ref 85.0–98.0)
Oxygenated Hemoglobin: 63.7 % — ABNORMAL LOW (ref 85.0–98.0)

## 2022-12-24 LAB — CBC AND DIFFERENTIAL
Absolute NRBC: 0.04 10*3/uL — ABNORMAL HIGH (ref 0.00–0.00)
Basophils Absolute Automated: 0 10*3/uL (ref 0.00–0.08)
Basophils Automated: 0 %
Eosinophils Absolute Automated: 0.04 10*3/uL (ref 0.00–0.44)
Eosinophils Automated: 0.7 %
Hematocrit: 33.6 % — ABNORMAL LOW (ref 37.6–49.6)
Hgb: 10.8 g/dL — ABNORMAL LOW (ref 12.5–17.1)
Immature Granulocytes Absolute: 0.07 10*3/uL (ref 0.00–0.07)
Immature Granulocytes: 1.1 %
Instrument Absolute Neutrophil Count: 4.12 10*3/uL (ref 1.10–6.33)
Lymphocytes Absolute Automated: 1.78 10*3/uL (ref 0.42–3.22)
Lymphocytes Automated: 29 %
MCH: 32.3 pg (ref 25.1–33.5)
MCHC: 32.1 g/dL (ref 31.5–35.8)
MCV: 100.6 fL — ABNORMAL HIGH (ref 78.0–96.0)
MPV: 13 fL — ABNORMAL HIGH (ref 8.9–12.5)
Monocytes Absolute Automated: 0.12 10*3/uL — ABNORMAL LOW (ref 0.21–0.85)
Monocytes: 2 %
Neutrophils Absolute: 4.12 10*3/uL (ref 1.10–6.33)
Neutrophils: 67.2 %
Nucleated RBC: 0.7 /100 WBC — ABNORMAL HIGH (ref 0.0–0.0)
Platelets: 69 10*3/uL — ABNORMAL LOW (ref 142–346)
RBC: 3.34 10*6/uL — ABNORMAL LOW (ref 4.20–5.90)
RDW: 15 % (ref 11–15)
WBC: 6.13 10*3/uL (ref 3.10–9.50)

## 2022-12-24 LAB — COMPREHENSIVE METABOLIC PANEL
ALT: 257 U/L — ABNORMAL HIGH (ref 0–55)
ALT: 234 U/L — ABNORMAL HIGH (ref 0–55)
AST (SGOT): 80 U/L — ABNORMAL HIGH (ref 5–41)
AST (SGOT): 74 U/L — ABNORMAL HIGH (ref 5–41)
Albumin/Globulin Ratio: 1 (ref 0.9–2.2)
Albumin/Globulin Ratio: 0.9 (ref 0.9–2.2)
Albumin: 3 g/dL — ABNORMAL LOW (ref 3.5–5.0)
Albumin: 3 g/dL — ABNORMAL LOW (ref 3.5–5.0)
Alkaline Phosphatase: 81 U/L (ref 37–117)
Alkaline Phosphatase: 94 U/L (ref 37–117)
Anion Gap: 12 (ref 5.0–15.0)
Anion Gap: 13 (ref 5.0–15.0)
BUN: 46 mg/dL — ABNORMAL HIGH (ref 9.0–28.0)
BUN: 44 mg/dL — ABNORMAL HIGH (ref 9.0–28.0)
Bilirubin, Total: 1.2 mg/dL (ref 0.2–1.2)
Bilirubin, Total: 1.2 mg/dL (ref 0.2–1.2)
CO2: 21 mEq/L (ref 17–29)
CO2: 22 mEq/L (ref 17–29)
Calcium: 8.5 mg/dL (ref 7.9–10.2)
Calcium: 8.7 mg/dL (ref 7.9–10.2)
Chloride: 99 mEq/L (ref 99–111)
Chloride: 100 mEq/L (ref 99–111)
Creatinine: 2.6 mg/dL — ABNORMAL HIGH (ref 0.5–1.5)
Creatinine: 2.7 mg/dL — ABNORMAL HIGH (ref 0.5–1.5)
Globulin: 3.1 g/dL (ref 2.0–3.6)
Globulin: 3.2 g/dL (ref 2.0–3.6)
Glucose: 276 mg/dL — ABNORMAL HIGH (ref 70–100)
Glucose: 340 mg/dL — ABNORMAL HIGH (ref 70–100)
Potassium: 4.1 mEq/L (ref 3.5–5.3)
Potassium: 4.2 mEq/L (ref 3.5–5.3)
Protein, Total: 6.1 g/dL (ref 6.0–8.3)
Protein, Total: 6.2 g/dL (ref 6.0–8.3)
Sodium: 132 mEq/L — ABNORMAL LOW (ref 135–145)
Sodium: 135 mEq/L (ref 135–145)
eGFR: 24.8 mL/min/{1.73_m2} — AB (ref >=60)
eGFR: 23.7 mL/min/{1.73_m2} — AB (ref >=60)

## 2022-12-24 LAB — HEPATIC FUNCTION PANEL (LFT)
ALT: 241 U/L — ABNORMAL HIGH (ref 0–55)
AST (SGOT): 72 U/L — ABNORMAL HIGH (ref 5–41)
Albumin/Globulin Ratio: 1 (ref 0.9–2.2)
Albumin: 3 g/dL — ABNORMAL LOW (ref 3.5–5.0)
Alkaline Phosphatase: 71 U/L (ref 37–117)
Bilirubin Direct: 0.6 mg/dL — ABNORMAL HIGH (ref 0.0–0.5)
Bilirubin Indirect: 0.6 mg/dL (ref 0.2–1.0)
Bilirubin, Total: 1.2 mg/dL (ref 0.2–1.2)
Globulin: 3.1 g/dL (ref 2.0–3.6)
Protein, Total: 6.1 g/dL (ref 6.0–8.3)

## 2022-12-24 LAB — PT AND APTT
PT INR: 1.4 — ABNORMAL HIGH (ref 0.9–1.1)
PT: 16.3 s — ABNORMAL HIGH (ref 10.1–12.9)
PTT: 102 s — ABNORMAL HIGH (ref 27–39)

## 2022-12-24 LAB — HEMOLYSIS INDEX(SOFT): Hemolysis Index: 61 Index — ABNORMAL HIGH (ref 0–24)

## 2022-12-24 LAB — CULTURE, METHICILLIN RESISTANT STAPHYLOCOCCUS AUREUS (MRSA)
Culture MRSA Surveillance: NEGATIVE
Culture MRSA Surveillance: NEGATIVE

## 2022-12-24 LAB — VITAMIN B12: Vitamin B-12: >2000 pg/mL — ABNORMAL HIGH (ref 211–911)

## 2022-12-24 LAB — FOLATE: Folate: 12.6 ng/mL

## 2022-12-24 LAB — LACTIC ACID
Lactic Acid: 1.1 mmol/L (ref 0.2–2.0)
Lactic Acid: 1.6 mmol/L (ref 0.2–2.0)
Lactic Acid: 2.5 mmol/L — ABNORMAL HIGH (ref 0.2–2.0)
Lactic Acid: 1.4 mmol/L (ref 0.2–2.0)

## 2022-12-24 LAB — MAGNESIUM: Magnesium: 2.2 mg/dL (ref 1.6–2.6)

## 2022-12-24 LAB — HAPTOGLOBIN: Haptoglobin: 165 mg/dL (ref 14–258)

## 2022-12-24 LAB — WHOLE BLOOD GLUCOSE POCT
Whole Blood Glucose POCT: 323 mg/dL — ABNORMAL HIGH (ref 70–100)
Whole Blood Glucose POCT: 318 mg/dL — ABNORMAL HIGH (ref 70–100)
Whole Blood Glucose POCT: 311 mg/dL — ABNORMAL HIGH (ref 70–100)
Whole Blood Glucose POCT: 338 mg/dL — ABNORMAL HIGH (ref 70–100)

## 2022-12-24 LAB — FIBRINOGEN: Fibrinogen: 400 mg/dL (ref 181–413)

## 2022-12-24 LAB — LACTATE DEHYDROGENASE: LDH: 507 U/L — ABNORMAL HIGH (ref 120–331)

## 2022-12-24 MED ORDER — ISOSORBIDE DINITRATE 10 MG PO TABS
10.0000 mg | ORAL_TABLET | Freq: Once | ORAL | Status: AC
Start: 2022-12-24 — End: 2022-12-24
  Administered 2022-12-24: 10 mg via ORAL
  Filled 2022-12-24: qty 1

## 2022-12-24 MED ORDER — METOLAZONE 5 MG PO TABS
10.0000 mg | ORAL_TABLET | Freq: Once | ORAL | Status: AC
Start: 2022-12-24 — End: 2022-12-24
  Administered 2022-12-24: 10 mg via ORAL
  Filled 2022-12-24: qty 2

## 2022-12-24 MED ORDER — DEXTROSE 50 % IV SOLN
12.5000 g | INTRAVENOUS | Status: DC | PRN
Start: 2022-12-24 — End: 2023-01-04

## 2022-12-24 MED ORDER — FUROSEMIDE 10 MG/ML IJ SOLN
200.0000 mg | Freq: Once | INTRAMUSCULAR | Status: AC
Start: 2022-12-24 — End: 2022-12-24
  Administered 2022-12-24: 200 mg via INTRAVENOUS
  Filled 2022-12-24: qty 20

## 2022-12-24 MED ORDER — VANCOMYCIN HCL IN NACL 1.5-0.9 GM/500ML-% IV SOLN
1500.0000 mg | Freq: Once | INTRAVENOUS | Status: AC
Start: 2022-12-24 — End: 2022-12-24
  Administered 2022-12-24: 1500 mg via INTRAVENOUS
  Filled 2022-12-24: qty 1500

## 2022-12-24 MED ORDER — GLUCAGON 1 MG IJ SOLR (WRAP)
1.0000 mg | INTRAMUSCULAR | Status: DC | PRN
Start: 2022-12-24 — End: 2023-01-04

## 2022-12-24 MED ORDER — DEXTROSE 10 % IV BOLUS
12.5000 g | INTRAVENOUS | Status: DC | PRN
Start: 2022-12-24 — End: 2023-01-04

## 2022-12-24 MED ORDER — INSULIN GLARGINE 100 UNIT/ML SC SOLN
8.0000 [IU] | Freq: Two times a day (BID) | SUBCUTANEOUS | Status: DC
Start: 2022-12-24 — End: 2022-12-25

## 2022-12-24 MED ORDER — GLUCOSE 40 % PO GEL (WRAP)
15.0000 g | ORAL | Status: DC | PRN
Start: 2022-12-24 — End: 2023-01-04

## 2022-12-24 MED ORDER — AMIODARONE HCL 200 MG PO TABS
400.0000 mg | ORAL_TABLET | Freq: Three times a day (TID) | ORAL | Status: AC
Start: 2022-12-24 — End: 2022-12-29
  Administered 2022-12-24 – 2022-12-29 (×16): 400 mg via ORAL
  Filled 2022-12-24 (×16): qty 2

## 2022-12-24 MED ORDER — FUROSEMIDE INFUSION 2 MG/ML (SIMPLE)
20.0000 mg/h | Status: DC
Start: 2022-12-24 — End: 2022-12-26
  Administered 2022-12-24 – 2022-12-26 (×6): 20 mg/h via INTRAVENOUS
  Filled 2022-12-24 (×6): qty 100

## 2022-12-24 MED ORDER — INSULIN GLARGINE 100 UNIT/ML SC SOLN
15.0000 [IU] | Freq: Two times a day (BID) | SUBCUTANEOUS | Status: DC
Start: 2022-12-24 — End: 2022-12-25
  Administered 2022-12-24 – 2022-12-25 (×2): 15 [IU] via SUBCUTANEOUS
  Filled 2022-12-24 (×2): qty 15

## 2022-12-24 MED ORDER — INSULIN LISPRO 100 UNIT/ML SOLN (WRAP)
1.0000 [IU] | Freq: Every evening | Status: DC
Start: 2022-12-24 — End: 2023-01-04
  Administered 2022-12-24: 5 [IU] via SUBCUTANEOUS
  Administered 2022-12-25: 2 [IU] via SUBCUTANEOUS
  Administered 2022-12-26: 5 [IU] via SUBCUTANEOUS
  Administered 2022-12-29: 1 [IU] via SUBCUTANEOUS
  Administered 2022-12-31 – 2023-01-03 (×3): 2 [IU] via SUBCUTANEOUS
  Filled 2022-12-24: qty 6
  Filled 2022-12-24 (×2): qty 15
  Filled 2022-12-24: qty 3
  Filled 2022-12-24: qty 6
  Filled 2022-12-24: qty 3
  Filled 2022-12-24: qty 6

## 2022-12-24 MED ORDER — INSULIN LISPRO 100 UNIT/ML SOLN (WRAP)
2.0000 [IU] | Freq: Three times a day (TID) | Status: DC
Start: 2022-12-25 — End: 2023-01-04
  Administered 2022-12-25 (×3): 4 [IU] via SUBCUTANEOUS
  Administered 2022-12-26: 6 [IU] via SUBCUTANEOUS
  Administered 2022-12-26: 4 [IU] via SUBCUTANEOUS
  Administered 2022-12-26: 6 [IU] via SUBCUTANEOUS
  Administered 2022-12-27: 2 [IU] via SUBCUTANEOUS
  Administered 2022-12-27: 4 [IU] via SUBCUTANEOUS
  Administered 2022-12-27: 2 [IU] via SUBCUTANEOUS
  Administered 2022-12-28: 6 [IU] via SUBCUTANEOUS
  Administered 2022-12-29 (×2): 4 [IU] via SUBCUTANEOUS
  Administered 2022-12-31 – 2023-01-01 (×2): 6 [IU] via SUBCUTANEOUS
  Administered 2023-01-01: 2 [IU] via SUBCUTANEOUS
  Administered 2023-01-02: 4 [IU] via SUBCUTANEOUS
  Administered 2023-01-03 (×2): 2 [IU] via SUBCUTANEOUS
  Administered 2023-01-04: 6 [IU] via SUBCUTANEOUS
  Filled 2022-12-24 (×2): qty 12
  Filled 2022-12-24 (×2): qty 6
  Filled 2022-12-24: qty 3
  Filled 2022-12-24 (×2): qty 12

## 2022-12-24 MED ORDER — ISOSORBIDE DINITRATE 20 MG PO TABS
20.0000 mg | ORAL_TABLET | Freq: Three times a day (TID) | ORAL | Status: DC
Start: 2022-12-24 — End: 2022-12-31
  Administered 2022-12-24 – 2022-12-31 (×18): 20 mg via ORAL
  Filled 2022-12-24: qty 2
  Filled 2022-12-24 (×7): qty 1
  Filled 2022-12-24: qty 2
  Filled 2022-12-24: qty 1
  Filled 2022-12-24: qty 2
  Filled 2022-12-24 (×2): qty 1
  Filled 2022-12-24: qty 2
  Filled 2022-12-24 (×2): qty 1
  Filled 2022-12-24 (×3): qty 2
  Filled 2022-12-24: qty 1

## 2022-12-24 NOTE — Progress Notes (Signed)
Patient's Name: [name] [name]   Admit Date: 12/21/2022  Medical Record Number: [id number]   Code Status: full code  Room: FI102/FI102-01  Date/Time: 12/25/22 8:45 AM  Cardiologist of record: [facility] (Dr. Judeth Horn)    INTERVAL EVENTS:  Seen by oncology (12/24/2022): Hold the Calquence as it does have an anticoagulant effect and may contribute to arrhythmia. At this moment from a CLL standpoint, he is quite stable.  Seen by oncology (12/24/22): Thrombocytopenia, worsening thrombocytopenia, HIT workup has been negative. We will certainly  monitor, hold off on intervention at this point, may require steroids.  We  will do a manual smear as well.  Vitals: Afebrile, HR high 70s, RR low 20s, BP 110s/60s, 97% on room air, MAPs 75+  Telemetry reviewed: 2 runs of NSVT (6-9 beats)  I/O:  -2.2L  PA Catheter: RA 17, PA 55/25, PCWP 23, CI/CO Fick 2.43/5.28, SVR 984, PAPI, CPO   Labs: WBC 5.75, Hgb 10.2 (11.1, 10.8), platelets 71 (76, 64, 69), BUN 42 (44, 46), Creatinine 2.7 (2.7, 2.6, 2.8), Sodium 134 ( 135, 132), Lactic acid 1.3 (1.4, 1.6), AST 57 (74, 72, 80), ALT 207 (234, 241), Tbili 1.5 (1.2), albumin 3.0     Imaging/Diagnostics: No change mild edema   Microbiology:12/25/22   No Growth after 2 day/s of incubation.   Current Infusions: Dobutamine 5 mcg/kg/min, lasix 20 mg/hr, heparin    Subjectively, denies any medical complaints. No chest pain, nausea, emesis, lower extremity swelling.     IMPRESSIONS   Heart failure-cardiogenic shock, SCAI stage C  Acute on chronic decompensated heart failure, LVEF less than 15%  Ischemic cardiomyopathy, NYHA class IV, ACC/AHA stage D, currently undergoing DT LVAD evaluation  RHC 09/09/22: RA 7, RV 39/4, PA 41/21/29, PCWP 14, PA sat 62%, RA sat 59%, AO sat 95%, Fick CO/CI 4.0/2.0, TD CO/CI 3.5/1.7, MAP 89, PAPi 3, PVR 3.75-4.3 WU  TTE 12/21/22: Severely reduced LV function with EF 11%, LVIDD 6.8 cm, global hypokinesis, mildly dilated RV with moderately decreased RV systolic function, severe  functional MR, severe tricuspid regurgitation with RVSP 51 mmHg  RHC (12/22/22): RA 15, RV 48/1. 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 At this moment from a CLL standpoint, he is quite stable.  Right lower extremity DVT (5/23)   Type 2 diabetes with an A1c 5.9%  Stable COPD  Multiple hospital admissions for acute decompensated heart failure in 2023  DM2  Thrombocytopenia  Iron deficit anemia   Iron studies (3/6): Iron sat 10, Ferritin 57  Depression   Consult to psych   Aspiration event  Aspirated on 12/22/22, passed swallow eval thereafter     RECOMMENDATIONS  Heart failure-cardiogenic shock, SCAI stage C  Acute on chronic decompensated heart failure, LVEF less than 15%  Preload: Lasix '200mg'$  IV and maintain drip at 20 mg/hr, FG of -2 L  Inotrope: C/ dobutamine 5 mcg/kg/min   Afterload: Increase hydralazine to 75 mg 3 times daily and Isordil 10 mg 3 times daily for afterload reduction   MCS: If he decompensates, would consider IABP as a bridge to DT LVAD   Advanced therapies candidacy:Currently being evaluated for DT LVAD. Concern for kidney function but it is improving with diuresis and closer to his baseline creatinine in 03/2022 was 1.5   In case of deterioration: IABP as above  GDMT for Heart failure  Beta blocker: Hold in setting of cardiogenic  shock  ACE/ARB/ARNI: Hold in setting of worsening renal function  MRA: Hold in setting of worsening renal function.  Previously has had hyperkalemia issues  SGLT2i: Hold for possible procedures, and reduced renal function  Consult to PICC team    Ectopy   Convert IV amiodarone to PO amiodarone     Iron deficit anemia   Iron studies (3/6): Iron sat 10, Ferritin 57  IV iron repletion (12/23/22-12/28/22)    Thrombocytopenia   Suspect abx induced vs critical illness   Seen by oncology (12/24/22): Thrombocytopenia, worsening thrombocytopenia, HIT workup has been negative. We will certainly  monitor, hold off on intervention at this point, may require steroids.  We  will do a manual smear as well.    Right lower extremity DVT  Likely provoked in the setting of cancer  C/ heparin infusion pending HIT panel    Rest of care per [facility]     Toya Smothers, MD  Cardiology Fellow, PGY4   12/25/22 8:45 AM      Reason for Consult: AHF-CS    History:  77 y.o. 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  He was given a dose of adenosine and had an unsuccessful cardioversion with 120 J.  Adenosine eventually converted to sinus rhythm and telemetry showed a sensed V paced rhythms.    Admitted to ICU at [facility] for further management. However, then transferred to [facility] on new Dobutamine 2.5 mcg/kg/min for LVAD consideration.      Of note follows with [facility]. Has been followed by [facility] for advanced therapies, including LVAD where he has been seen by Dr. Judeth Horn. Home medications include Eliquis 2.5 mg BID, ASA 81 mg QD, Lipitor 40 mg QHS, Jardiance 10 mg QD, Hydralazine 25 mg Q8H, Isosorbide Dinitrate 10 mg TID, metoprolol  succinate XL 12. 5 mg QD, torsemide 40 mg BID, Lantus 50U QD. Was previously on Aldactone but discontinued given renal function. Dry weight is around 185 lb per 10/2022, weight today around 195-199 lb.     Follows with Dr. Mervin Kung of [facility] for CLL. Dx'd in 06/2020. Thought to be well controlled on acalabrutinib.     Follows with [facility] for CKD. Cr increased from 1.5 in 03/2022 to 2.3 in 09/2022. Previously on HD/UF in 2018.     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  He lives with his wife in [place], previously was living in [place] but due to high cost of living and exorbitant housing rates, he moved to [place].  He has 3 sons and 1 daughter, and  1 grandchild.  They all live in different parts of the [place] and no one is close by.  His primary caregiver is his wife    Current Meds:  albumin human, , ,   amiodarone, 400 mg, Oral, TID  aspirin EC, 81 mg, Oral, Daily  atorvastatin, 40 mg, Oral, QHS  hydrALAZINE, 50 mg, Oral, Q8H SCH  insulin glargine, 15 Units, Subcutaneous, Q12H   Or  insulin glargine, 8 Units, Subcutaneous, Q12H  insulin lispro, 1-6 Units, Subcutaneous, QHS  insulin lispro, 2-10 Units, Subcutaneous, TID AC  iron sucrose, 200 mg, Intravenous, Q24H SCH  isosorbide dinitrate, 20 mg, Oral, TID - Nitrate Free Interval  niacin, 500 mg, Oral, BID  polyethylene glycol, 17 g, Oral, Daily  senna-docusate, 1 tablet, Oral, Q12H [place]         Drips:   DOBUTamine 5 mcg/kg/min (12/25/22 0700)    furosemide (LASIX) infusion 2 mg/mL 20 mg/hr (12/25/22 0700)    heparin infusion 25,000 units/500 mL (VTE/Moderate Intensity) 18 Units/kg/hr (12/25/22 0700)       PMH:    Past Medical History:   Diagnosis Date    Acute CHF     NOV  & DEC 2018, - DIALYSIS CATH PLACED Aug 24 2017 TO REMOVE FLUID     Acute systolic (congestive) heart failure 08/2017    Anxiety     CAD (coronary artery disease)     Cardiomyopathy      nonischemic    CHF (congestive heart failure) 08/12/2014, 2013    Chronic obstructive pulmonary disease     POSSIBLE PER PT HE USES  SYNBICORT BID ABD  BREO INHALER PRN    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    Coronary artery disease 2011    CORONARY STENT PLACEMENT FOLLOWED BY [facility]    Depression     echocardiogram 02/2016, 11/2016, 09/2017, 12/20/2017    GERD (gastroesophageal reflux disease)     Heart attack 2011    and 2014    Hyperlipemia     Hypertension     ICD (implantable 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     PLACED 2014  [facility]  LAST INTERROGATION April 01 2018 REPORT REQUESTED    Ischemic cardiomyopathy     EF 15% ON ECHO 09-20-2017 DR GARG IN EPIC    Nonischemic cardiomyopathy     NSTEMI (non-ST elevated myocardial infarction)     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    Pacemaker 2014    MEDTRONIC ICD/PACEMAKER COMBO LAST INTERROGATION  12-12 2018    Pneumonia 09/2016    Primary cardiomyopathy     Ischemic cardiomyopathy EF 15% ON ECHO 09-20-2017 DR GARG IN EPIC    Sepsis 08/2017    Syncope and collapse     PRIOR TO ICD/PACEMAKER PLACED 2014    Type 2 diabetes mellitus, controlled DX 1998    BS  AVG 100  A1C 7.4 Dec 27 2017    [place]     PT SEE HIS PMD April 01 2018 RE THIS-TOLD TO SEE PMD BY [facility] JUNE 12 WHEN THEY SAW HIM FOR A CL       PSH:    Past Surgical History:   Procedure Laterality Date    BIV  11/10/2016    ICD METRONIC  UPGRADE     CARDIAC CATHETERIZATION  03/2010    CORONARY STENT PLACEMENT X 2 PER PT; LM normal, LAD 70-80% distal lesion at apex, 20% lesion mid CFX    CARDIAC CATHETERIZATION  12/2012  CARDIAC DEFIBRILLATOR PLACEMENT  12/2012    [facility]    CARDIAC PACEMAKER PLACEMENT  2014    MEDTRONIC ICD/PACEMAKER PLACED [facility]    CIRCUMCISION  AGE 38    COLONOSCOPY  2009    CORRECTION HAMMER TOE      duodenal ulcer  1973    STRESS RELATED IN SCHOOL    ECHOCARDIOGRAM, TRANSTHORACIC  02/2016 11/2016 09/2017 12/2017    ECHOCARDIOGRAM, TRANSTHORACIC  02/2016,11/2016,12/20/2017    EGD N/A  08/15/2014    Procedure: EGD;  Surgeon: Loraine Maple, MD;  Location: [facility];  Service: Gastroenterology;  Laterality: N/A;  egd w/ bx    EGD  1975    EGD, COLONOSCOPY N/A 04/04/2018    Procedure: EGD with bxs, COLONOSCOPY with polypectomy and clipping;  Surgeon: Omer Jack, MD;  Location: [facility];  Service: Gastroenterology;  Laterality: N/A;    FRACTURE SURGERY  AGE 46     RT  ANKLE- CAST APPLIED    HERNIA REPAIR  AGE 38    LEFT INGUINAL HERNIA    MPI nuclear study  06/2016    orthopedic surgery  AGE 60    RT foot corrective - HAMMERTOE    RIGHT HEART CATH Right 09/09/2022    Procedure: Right Heart Cath;  Surgeon: Wandra Arthurs, MD;  Location: [facility];  Service: Cardiovascular;  Laterality: Right;  tx from [place]       FH:  Family History   Problem Relation Age of Onset    Breast cancer Mother     Heart attack Mother 2    Hypertension Mother     Diabetes Mother     Coronary artery disease Mother     Diabetes Brother     Heart attack Father     Hypertension Father        Social History:  Social History     Socioeconomic History    Marital status: Married     Spouse name: Architect    Number of children: 0    Years of education: Not on file    Highest education level: Not on file   Occupational History    Occupation: Pharmacist, 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[employer], history    Tobacco Use    Smoking status: Never    Smokeless tobacco: Never   Vaping Use    Vaping Use: Never used   Substance and Sexual Activity    Alcohol use: Not Currently    Drug use: No    Sexual activity: Yes     Partners: Female   Other Topics Concern    Not on file   Social History Narrative    Not on file     Social Determinants of Health     Financial Resource Strain: Low Risk  (12/22/2022)    Overall Financial Resource Strain (CARDIA)     Difficulty of Paying Living Expenses: Not hard at all   Food Insecurity: No Food Insecurity (09/12/2022)    Hunger Vital Sign     Worried About Running  Out of Food in the Last Year: Never true     [place] in the Last Year: Never true   Transportation Needs: No Transportation Needs (12/22/2022)    PRAPARE - Armed forces logistics/support/administrative officer (Medical): No     Lack of Transportation (Non-Medical): No   Physical Activity: Not on file   Stress: Not on file  Social Connections: Not on file   Intimate Partner Violence: Not At Risk (09/12/2022)    Humiliation, Afraid, Rape, and Kick questionnaire     Fear of Current or Ex-Partner: No     Emotionally Abused: No     Physically Abused: No     Sexually Abused: No   Housing Stability: Low Risk  (12/22/2022)    Housing Stability Vital Sign     Unable to Pay for Housing in the Last Year: No     Number of Places Lived in the Last Year: 1     Unstable Housing in the Last Year: No       Home medications:   Prior to Admission medications    Medication Sig Start Date End Date Taking? Authorizing Provider   apixaban (ELIQUIS) 5 MG Take 1 tablet (5 mg) by mouth every 12 (twelve) hours For 1 week followed by 1 tablet twice daily 09/14/22   Irma Newness, MD   aspirin EC 81 MG EC tablet Take 1 tablet (81 mg) by mouth daily    [provider]   atorvastatin (LIPITOR) 40 MG tablet Take 1 tablet (40 mg total) by mouth nightly 07/16/21   Carlis Abbott, MD   Calquence 100 MG Tab  04/07/22   [provider]   hydrALAZINE (APRESOLINE) 25 MG tablet Take 1 tablet (25 mg) by mouth every 8 (eight) hours 09/14/22 12/13/22  Irma Newness, MD   Insulin Glargine-yfgn (Semglee, yfgn,) 100 UNIT/ML Solution Pen-injector 50 Units by Subdermal route daily 10/26/22   Clearance Coots, MD   isosorbide dinitrate (ISORDIL) 10 MG tablet Take 1 tablet (10 mg) by mouth 3 (three) times daily 09/14/22 12/13/22  Irma Newness, MD   Jardiance 10 MG tablet TAKE 1 TABLET(10 MG) BY MOUTH DAILY 06/05/22   Clotilde Dieter, NP   loratadine (CLARITIN) 10 MG tablet Take 1 tablet (10 mg) by mouth daily 04/18/22   Regis Bill, MD   metoprolol succinate  XL (TOPROL-XL) 25 MG 24 hr tablet Take 0.5 tablets (12.5 mg) by mouth daily 05/22/22   Clotilde Dieter, NP   niacin (NIASPAN) 500 MG CR tablet TAKE 1 TABLET BY MOUTH TWICE DAILY  Patient taking differently: 250 mg Taking half a tablet 02/23/22   Clearance Coots, MD   torsemide (DEMADEX) 20 MG tablet Take 2 tablets (40 mg) by mouth 2 (two) times daily 09/14/22   Irma Newness, MD        Inpatient/ER medications:  Medications Prior to Admission   Medication Sig    apixaban (ELIQUIS) 5 MG Take 1 tablet (5 mg) by mouth every 12 (twelve) hours For 1 week followed by 1 tablet twice daily    aspirin EC 81 MG EC tablet Take 1 tablet (81 mg) by mouth daily    atorvastatin (LIPITOR) 40 MG tablet Take 1 tablet (40 mg total) by mouth nightly    Calquence 100 MG Tab     hydrALAZINE (APRESOLINE) 25 MG tablet Take 1 tablet (25 mg) by mouth every 8 (eight) hours    Insulin Glargine-yfgn (Semglee, yfgn,) 100 UNIT/ML Solution Pen-injector 50 Units by Subdermal route daily    isosorbide dinitrate (ISORDIL) 10 MG tablet Take 1 tablet (10 mg) by mouth 3 (three) times daily    Jardiance 10 MG tablet TAKE 1 TABLET(10 MG) BY MOUTH DAILY    loratadine (CLARITIN) 10 MG tablet Take 1 tablet (10 mg) by mouth daily    metoprolol succinate XL (TOPROL-XL) 25 MG 24 hr tablet  Take 0.5 tablets (12.5 mg) by mouth daily    niacin (NIASPAN) 500 MG CR tablet TAKE 1 TABLET BY MOUTH TWICE DAILY (Patient taking differently: 250 mg Taking half a tablet)    torsemide (DEMADEX) 20 MG tablet Take 2 tablets (40 mg) by mouth 2 (two) times daily       Current Inpatient :  Current Facility-Administered Medications   Medication Dose Route Frequency    albumin human        amiodarone  400 mg Oral TID    aspirin EC  81 mg Oral Daily    atorvastatin  40 mg Oral QHS    hydrALAZINE  50 mg Oral Q8H [place]    insulin glargine  15 Units Subcutaneous Q12H    Or    insulin glargine  8 Units Subcutaneous Q12H    insulin lispro  1-6 Units Subcutaneous QHS    insulin lispro  2-10 Units  Subcutaneous TID AC    iron sucrose  200 mg Intravenous Q24H SCH    isosorbide dinitrate  20 mg Oral TID - Nitrate Free Interval    niacin  500 mg Oral BID    polyethylene glycol  17 g Oral Daily    senna-docusate  1 tablet Oral Q12H [place]      DOBUTamine 5 mcg/kg/min (12/25/22 0700)    furosemide (LASIX) infusion 2 mg/mL 20 mg/hr (12/25/22 0700)    heparin infusion 25,000 units/500 mL (VTE/Moderate Intensity) 18 Units/kg/hr (12/25/22 0700)       ROS:  No constitutional complaints, no respiratory complaints, no musculoskeletal complaints, or other pertinent review of symptoms except as noted in the history of present illness.    Physical Exam:  I/O last 3 completed shifts:  In: 3884.26 [P.O.:1050; I.V.:2134.3; IV Piggyback:699.96]  Out: [id number] [Urine:5775]  Weight:   Wt Readings from Last 4 Encounters:   12/21/22 93.1 kg (205 lb 4 oz)   12/21/22 90.4 kg (199 lb 4.7 oz)   11/23/22 91.4 kg (201 lb 6.4 oz)   10/26/22 92.4 kg (203 lb 12.8 oz)     Vitals:    12/25/22 0700   BP: 113/63   Pulse: 79   Resp: 21   Temp:    SpO2: 97%      General: in no distress.  Alert and oriented x 3  Eyes: No xanthelasma.  Oropharynx: No mucosal pallor.  Neck: Trachea midline, JVD elevated to 12cm H2O. R IJ PAC    Pulmonary: Crackles bilaterally.  Cardiovascular: PMI nondisplaced with normal rate and rhythm, normal S1, normal S2, no murmur, rub, or gallop appreciated.  Abdomen: Soft, nontender, mildly distended  Extremities: 1+ edema, no clubbing, no cyanosis.  Skin: Cool to touch   Psych: Mood was pleasant     Laboratory:      Labs:  CBC        12/25/22  0328 12/24/22  2113 12/24/22  0912   WBC 5.75 5.37 5.49   Hgb 10.2* 11.1* 10.8*   Hematocrit 31.3* 33.7* 33.2*   Platelets 71* 76* 64*     BMP        12/25/22  0328 12/24/22  2113 12/24/22  1513 12/24/22  0912 12/24/22  0301 12/23/22  2109 12/23/22  1602 12/22/22  0424 12/21/22  2247 12/21/22  1837 12/20/22  2038 12/20/22  2034   Sodium 134*  --  135  --  132* 133* 133*   < > 135 135   < >  142   Potassium  3.6  --  4.2  --  4.1 3.9 4.1   < > 4.9 4.8   < > 4.5   Chloride 98*  --  100  --  99 99 100   < > 101 103   < > 106   CO2 24  --  22  --  '21 22 24   '$ < > 17 19   < > 17   BUN 42.0*  --  44.0*  --  46.0* 49.0* 50.0*   < > 61.0* 60.0*   < > 48.0*   Creatinine 2.7*  --  2.7*  --  2.6* 2.8* 2.8*   < > 4.2* 4.1*   < > 3.0*   Creatinine I-Stat  --   --   --   --   --   --   --   --   --   --    < >  --    eGFR 23.7*  --  23.7*  --  24.8* 22.7* 22.7*   < > 13.9* 14.3*   < > 20.9*   Calcium 9.0  --  8.7  --  8.5 8.3 8.3   < > 8.7 8.9   < > 10.1   Magnesium  --   --   --   --  2.2 2.2 2.3   < > 2.9* 2.8*   < > 2.9*   Phosphorus 2.9  --   --   --   --   --   --   --  7.2* 7.4*  --   --    Lactic Acid 1.3 1.4 2.5* 1.6 1.1 1.6 2.2*   < > 2.2* 1.8   < >  --    NT-proBNP  --   --   --   --   --   --   --   --  [id number]*  --   --  [id number]*   LDH  --   --   --  507*  --   --   --   --   --   --   --   --     < > = values in this interval not displayed.       LFTs        12/25/22  0328 12/24/22  1513 12/24/22  0912 12/22/22  0424 12/21/22  2247   AST (SGOT) 57* 74* 72*   < > 426*   ALT 207* 234* 241*   < > 425*   Alkaline Phosphatase 79 94 71   < > 81   Bilirubin, Total 1.5* 1.2 1.2   < > 1.8*   Bilirubin Direct  --   --  0.6*  --  0.9*   Bilirubin Indirect  --   --  0.6  --  0.9    < > = values in this interval not displayed.       Coags        12/25/22  0328 12/24/22  1112 12/24/22  0301 12/23/22  1713 12/23/22  1200 12/22/22  0424 12/21/22  2247   PT  --  16.3*  --  17.1*  --   --  29.0*   PT INR  --  1.4*  --  1.5*  --   --  2.5*   PTT  --  102*  --  33 63*   < > 32   Anti-Xa, UFH 0.51 0.59 0.52  --   --   --  1.21*    < > = values in this interval not displayed.       ABGs        Microbiology  Microbiology Results (last 15 days)       Procedure Component Value Units Date/Time    MRSA culture [id number] Collected: 12/23/22 1200    Order Status: Completed Specimen: Culturette from Nasal Swab Updated: 12/24/22 1311      Culture MRSA Surveillance Negative for Methicillin Resistant Staph aureus    MRSA culture [id number] Collected: 12/23/22 1200    Order Status: Completed Specimen: Culturette from Throat Updated: 12/24/22 1311     Culture MRSA Surveillance Negative for Methicillin Resistant Staph aureus    Culture Blood Aerobic and Anaerobic [id number] Collected: 12/22/22 2239    Order Status: Completed Specimen: Blood, Venipuncture Updated: 12/25/22 [id number]    Narrative:      The order will result in two separate 8-90m bottles  Please do NOT order repeat blood cultures if one has been  drawn within the last 48 hours  UNLESS concerned for  endocarditis  AVOID BLOOD CULTURE DRAWS FROM CENTRAL LINE IF POSSIBLE  Indications:->Pneumonia  ORDER#: [id number]                                   ORDERED BY: GVear Clock SOURCE: Blood, Venipuncture                          COLLECTED:  12/22/22 22:39  ANTIBIOTICS AT COLL.:                                RECEIVED :  12/23/22 02:00  Culture Blood Aerobic and Anaerobic        PRELIM      12/25/22 02:27  12/24/22   No Growth after 1 day/s of incubation.  12/25/22   No Growth after 2 day/s of incubation.      Culture Blood Aerobic and Anaerobic [[id number]: 12/22/22 2239    Order Status: Completed Specimen: Blood, Venipuncture Updated: 12/25/22 [id number]   Narrative:      The order will result in two separate 8-1101mbottles  Please do NOT order repeat blood cultures if one has been  drawn within the last 48 hours  UNLESS concerned for  endocarditis  AVOID BLOOD CULTURE DRAWS FROM CENTRAL LINE IF POSSIBLE  Indications:->Pneumonia  ORDER#: [id number]                                  ORDERED BY: GAVear ClockSOURCE: Blood, Venipuncture                          COLLECTED:  12/22/22 22:39  ANTIBIOTICS AT COLL.:                                RECEIVED :  12/23/22 01:59  Culture Blood Aerobic and Anaerobic        PRELIM      12/25/22 02:27  12/24/22   No Growth after 1 day/s of  incubation.  12/25/22   No Growth after 2 day/s of incubation.      Respiratory  Pathogen Panel w/COVID-19, PCR [id number]     Order Status: Canceled     COVID-19 (SARS-CoV-2) and Influenza A/B, NAA (Liat Rapid)- Admission [id number] Collected: 12/21/22 1050    Order Status: Completed Specimen: Culturette from Nasopharyngeal Updated: 12/21/22 1144     Purpose of COVID testing Diagnostic -PUI     SARS-CoV-2 Specimen Source Nasal Swab     SARS CoV 2 Overall Result Not Detected     Comment: __________________________________________________  -A result of "Detected" indicates POSITIVE for the    presence of SARS CoV-2 RNA  -A result of "Not Detected" indicates NEGATIVE for the    presence of SARS CoV-2 RNA  __________________________________________________________  Test performed using the Roche cobas Liat SARS-CoV-2 assay. This assay is  only for use under the Food and Drug Administration's Emergency Use  Authorization. This is a real-time RT-PCR assay for the qualitative  detection of SARS-CoV-2 RNA. Viral nucleic acids may persist in vivo,  independent of viability. Detection of viral nucleic acid does not imply the  presence of infectious virus, or that virus nucleic acid is the cause of  clinical symptoms. Negative results do not preclude SARS-CoV-2 infection and  should not be used as the sole basis for diagnosis, treatment or other  patient management decisions. Negative results must be combined with  clinical observations, patient history, and/or epidemiological information.  Invalid results may be due to inhibiting substances in the specimen and  recollection should occur. Please see Fact Sheets for patients and providers  located:  https://www.benson-chung.com/          Influenza A Not Detected     Influenza B Not Detected     Comment: Test performed using the Roche cobas Liat SARS-CoV-2 & Influenza A/B assay.  This assay is only for use under the Food and Drug Administration's  Emergency  Use Authorization. This is a multiplex real-time RT-PCR assay  intended for the simultaneous in vitro qualitative detection and  differentiation of SARS-CoV-2, influenza A, and influenza B virus RNA. Viral  nucleic acids may persist in vivo, independent of viability. Detection of  viral nucleic acid does not imply the presence of infectious virus, or that  virus nucleic acid is the cause of clinical symptoms. Negative results do  not preclude SARS-CoV-2, influenza A, and/or influenza B infection and  should not be used as the sole basis for diagnosis, treatment or other  patient management decisions. Negative results must be combined with  clinical observations, patient history, and/or epidemiological information.  Invalid results may be due to inhibiting substances in the specimen and  recollection should occur. Please see Fact Sheets for patients and providers  located: http://[facility].info/.         Narrative:      o Collect and clearly label specimen type:  o PREFERRED-Upper respiratory specimen: One Nasal Swab in  Transport Media.  o Hand deliver to laboratory ASAP  Diagnostic -[facility]    Respiratory Pathogen Panel w/COVID-19, PCR [id number] Collected: 12/21/22 0106    Order Status: Canceled Specimen: Nasopharyngeal     Culture Blood Aerobic and Anaerobic [id number] Collected: 12/21/22 0021    Order Status: Completed Specimen: Blood, Venipuncture Updated: 12/25/22 0425    Narrative:      Indications:->Other  Other->infection  ORDER#: [id number]                                    ORDERED BY: LEE, SIEW MEI  SOURCE: Blood, Venipuncture RAC                      COLLECTED:  12/21/22 00:21  ANTIBIOTICS AT COLL.:                                RECEIVED :  12/21/22 04:06  Culture Blood Aerobic and Anaerobic        PRELIM      12/25/22 04:25  12/22/22   No Growth after 1 day/s of incubation.  12/23/22   No Growth after 2 day/s of incubation.  12/24/22   No Growth after 3 day/s of incubation.  12/25/22   No  Growth after 4 day/s of incubation.      Culture Blood Aerobic and Anaerobic [id number] Collected: 12/21/22 0021    Order Status: Completed Specimen: Blood, Venipuncture Updated: 12/23/22 1055    Narrative:      [id number] called Micro Results of Pos Bld. Results read back [id number], by [id number]  on 12/22/2022 at 07:29  Indications:->Other  Other->infection  ORDER#: [id number]                                    ORDERED BY: LEE, SIEW MEI  SOURCE: Blood, Venipuncture LAC                      COLLECTED:  12/21/22 00:21  ANTIBIOTICS AT COLL.:                                RECEIVED :  12/21/22 04:06  [id number] called Micro Results of Pos Bld. Results read back [id number], by [id number] on 12/22/2022 at 07:29  Culture Blood Aerobic and Anaerobic        PRELIM      12/23/22 10:55   +  12/22/22   No Growth after 1 day/s of incubation.             Aerobic Blood Culture Positive in 24 to 48 hours             Gram Stain Shows: Gram positive cocci in clusters             Identification to follow  12/23/22   Anaerobic culture no growth to date, final report to follow  12/23/22   Growth of Staphylococcus (coagulase negative)               Possible skin contaminant, susceptibility testing not             performed without request unless additional blood cultures,             collected within 48 hours of this culture, become positive.             Contact the laboratory for further information if necessary.        MRSA culture [id number] Collected: 12/21/22 0000    Order Status: Canceled Specimen: Culturette from Nasal Swab     MRSA culture [id number] Collected: 12/21/22 0000    Order Status: Canceled Specimen: Culturette from Throat

## 2022-12-24 NOTE — Progress Notes (Signed)
Cardiac Intensive Care Unit (CICU)  History and Physical        Patient Name: [name] [name]  MRN: [id number]  Room: FI102/FI102-01    Chief Complaint    Cardiogenic shock SCAI stage C/D    History of Present Illness       3/4: RHC placed.   3/5: Diuresis, good UOP.   3/6: Started vanc and zosyn. Blood culture positive, repeat sent. RML possible infiltrate on CXR. CI: 1.7 overnight, lactate cleared. Wife to bring in CLL medication.   3/7: Improving CI overnight. Occasional 8 beat runs once or twice per shift.      Assessment & Plan   Assessment and Plan   Summary: [name] [name] a 77 y.o. male with PMHx NICM/ICM s/p Medtronik ICD (placed 2013, replaced 2018), CAD s/p PCI with DES 2011, T2DM, COPD, stage IV CLL, CKD, RLE DVT on Eliquis presented to [facility] ED in SVT s/p unsuccessful cardioversion with return to NSR s/p adenosine. Since, started on dobutamine and transferred to [place] for LVAD workup/evaluation, now status post right heart cath on 3/4, diuresing and optimizing cardiac index, aspiration event on 3/5, cleared by SLP, discontinued antibiotics.    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 if not adequate response  Following  filling pressures, sodium, physical exam  to guide diuresis  Afterload: continue home hydralazine 50 mg Q8H, and isordil 10 mg TID, holding BB  MCS: None  Inotropy: dobutamine 5 mcg/kg/min  Device: Medtronik ICD  Latest TTE: 3/4 with EF 11%, severe global hypokinesis, decent RV function   Advanced therapies: was undergoing evaluation for LVAD, delayed by renal function worsening  Continue ASA 81 mg QD and Lipitor 40 mg QHS  heparin infusion, VTE protocol   Blood Pressure Goal: MAP > 65    CVP: 19  PAP 62/27,  SVR:1208  CI: 2 (Fick) 2.2 (Therm)  SVO2:  0.56  Lactic acid cleared   PULMONARY:   Hx of COPD    Last vent settings if on vent:  FiO2:  [40 %-45 %] 40 %  Ventilator Time (if on vent):     Aspiration event 3/5 with evening meal, cleared by SLP  Off supplemental oxygen as needed to keep sat > 90%  Diuresis as above  incentive spirometer, rib mobilization techniques     RENAL:   AKI on CKD likely from chronic cardiorenal progression      Intake/Output Summary (Last 24 hours) at 12/24/2022 0703  Last data filed at 12/24/2022 0700  Gross per 24 hour   Intake 2407.13 ml   Output 3650 ml   Net -  1242.87 ml      Follows with [facility], consulted and appreciate recommendations  baseline serum creatinine about 2.5 mg/dL, Serum creatinine at 4 mg/dL initially, now down to 2.6 indicating improving organ perfusion  Follow BMP and mag q6hr while diuresing   Avoid nephrotoxin  Promote bedside urinal     GASTROINTESTINAL:  Transaminitis in setting of cardioversion    Last Bowel Movement:   Last BM Date: 12/23/22 LFT's daily, improving  Nutrition: N.p.o. until SLP eval, otherwise cardiac diet, renal diet  Bowel Regimen: senna/docusate, BM? Escalate with miralax, suppository if indicated     INFECTIOUS DISEASE:   C/f aspiration  Positive BC grams stain  Leukocytosis, resolved    No data recorded.     * No values recorded between 12/22/2022 12:25 PM and 12/24/2022  7:03 AM *  Lab Results   Component Value Date    WBC 6.13 12/24/2022      CAUTI Prevention (n/a  if blank):  Foley Day:         Blood Steam Infection Prevention (n/a if blank):   Pulmonary Artery Catheter 12/22/22 Internal jugular Right COVID/flu negative  Last culture dates: No growth to date on blood cultures from 3/5 , blood cultures 3/4, 1 bottle positive for Gram stain of Gram-positive cocci in clusters, coag negative staph, favoring contaminant  No leukocytosis, afebrile, but with new oxygen requirement, overall not giving infectious picture  vanc/Zosyn discontinued, will restart if indicated by follow-up cultures or development of infectious symptoms.  also may be contributing to thrombocytopenia  MRSA nares, pending                   HEME/ONC:   Hx of RLE DVT on Eliquis  Hx of CLL, on acalabrutinib  Acutely worsening thrombocytopenia    heparin 25,000 units in dextrose 5% 500 mL infusion (premix)  heparin gtt  HIT assay,negative, may consider serotonin release assay  Thrombocytopenia 2/2 shock, stress, production decrease, less likely destruction, will order smear, consider progression of CLL. Pan cell line decrease. Consider ITP, ADE of zosyn/vanc. Less favoring hemodilution.   Engaged heme-onc, recommended holding Calquence, appreciate recommendations  SCDs  Iron profile, C/w ID  IV iron 200 over 5 days clinically appropriate  If proven to be overtly bacteremic will reconsider, however do not favor this scenario currently   ENDO/RHEUM:  T2DM  Sliding scale  HgbA1C within acceptable range given age  Blood glucose in the mid 200s above acceptable  Goal glucose range 100 - 180  Glargine 10 units daily, may consider twice daily dosing with improving renal function and high 200s BS, home 50 units       Current Infusions (n/a if blank): dobutamine       Next of Kin Updated: Open ACP Navigator   Primary Emergency Contact: Stormer,Octavia  Yes    Code Status: Full Code  DVT prophylaxis: Heparin GGT  Tubes lines and drains: Pulmonary artery catheter, peripheral IV  Diet: N.p.o. currently, given  aspiration,  GI prophylaxis per Colace  Ulcer prophylaxis: None              Examination:   Vitals Heart Rate:  [75-86]   Resp Rate:  [14-26]   BP: (99-127)/(55-74)   SpO2:  [95 %-100 %]   Height:  [182.9 cm (6' 0.01")]  // Temperature with 24 range    Neuro:    Non-focal neuro exam alert & oriented x 3     Lungs:   clear  to auscultation, no wheezes, rales or rhonchi, symmetric air entry     Cardiac:   normal rate, regular rhythm, normal S1, S2, no murmurs, rubs, clicks or gallops     Abdomen:    soft, nontender, nondistended, no masses or organomegaly     Extremities:   peripheral pulses normal    Skin:     Warm, dry and intact Fick/Cardiac Hemodynamics   VO2 Determination  Height: 182.9 cm (6' 0.01")  Weight FICK (only): 93.1 kg (205 lb 4 oz)  BSA (Fick only) (Calculated - sq m): 2.17 sq meters  VO2: 271.25    CI (L/min/m2): 2.2 L/min/m2    CO/CI Determination  SaO2 : 0.97  SvO2 : 0.56  SaO2 - SvO2: 0.41  HGB Result: 10.8  x 1.36 x 10: 60.22  Cardiac Output (FICK): 4.5  Cardiac Index (FICK): 2.07  Cardiac Power Output: 0.93    PVR Determination  PAP: 60/27  PAP (Mean): 32 mmHg  Pulmonary Artery Pulsatility Index (PAPI): 1.74    SVR Determination  MAP (mmHg): 82  CVP (mmHg): 19 mmHg  MAP - CVP: 63  SVR (dynes/second/cm3): 1208.89 dynes/second/cm3       LA: 1.1,   Most Recent Labs  6.13 \ 10.8* / 69*    / 33.6* \    03/07 0301    132* 99 46.0* / 276*   4.1 21 2.6* \    03/07 0301                      Thelma Comp, DO  12/24/2022 7:03 AM     {

## 2022-12-24 NOTE — OT Eval Note (Signed)
Occupational Therapy Eval [name] [name]        Post Acute Care Therapy Recommendations:     Discharge Recommendations:  Home with supervision  D/C Milestones: increased mobility and ADLs Anticipate achievement in 2-3 sessions    DME needs IF patient is discharging home: Shower chair    Therapy discharge recommendations may change with patient status.  Please refer to most recent note for up-to-date recommendations.    Assessment:   Significant Findings: none    [name] [name] is a 77 y.o. male admitted 12/21/2022.  Patient presents with decreased ADL performance, functional mobility and activity tolerance.  Pt received in bed and agreeable to OT evaluation.  Pt required CGA to sit EOB and Max A to don socks.  CGA/Min A for sit<>stand and during household mobility with no AD.  Pt with 1 instance of LOB, Min A to recover.  Pt benefits from cues for taking breaks and educated on ECT - pt verbalized understanding.  Positioned for comfort in chair and needs met at end of session.  Continued OT services to address ADL and mobility goals would benefit pt.      Therapy Diagnosis: Decreased ADLs, functional mobility/transfers, activity tolerance     Rehabilitation Potential: good    Treatment Activities: Initial OT eval, ADLs, functional mobility/transfers, pt training     Educated the patient to role of occupational therapy, plan of care, goals of therapy and safety with mobility and ADLs, energy conservation techniques.    Plan:   OT Frequency Recommended: 3-4x/wk     Treatment Interventions: ADL retraining;Functional transfer training;UE strengthening/ROM;Endurance training;Patient/Family training;Equipment eval/education     Risks/benefits/POC discussed: yes      Unit: [facility]  Bed: FI102/FI102-01         Precautions and Contraindications:   Precautions  Other Precautions: falls risk      Consult received for [name] [name] for OT Evaluation and Treatment.  Patient's  medical condition is appropriate for Occupational Therapy intervention at this time.    Admitting Diagnosis: Cardiogenic shock [R57.0]  Heart failure [I50.9]      History of Present Illness:    [name] [name] is a 77 y.o. male admitted on 12/21/2022 with PMHx NICM/ICM s/p Medtronik ICD (placed 2013, replaced 2018), CAD s/p PCI with DES 2011, HLD, T2DM, COPD, stage IV CLL, CKD, RLE DVT on Eliquis who presents to [facility] ED on night of 03/03 with dyspnea, burning chest pain.  Found to be in SVT with HR 158 BPM, received unsuccessful cardioversion at 120 J.  Subsequently given adenosine 6 mg IV x 1, rhythm slowed and was found to be in atrial sensed ventricular paced rhythm with rate less than 100 BPM. Admitted to ICU at [facility] for further management. However, then transferred to [place] on new Dobutamine 2.5 mcg/kg/min for LVAD consideration.      Of note follows with [place] [facility]. Has been followed by [facility] for advanced therapies, including LVAD where he has been seen by Dr. Alain Marion. Home medications include Eliquis 2.5 mg BID, ASA 81 mg QD, Lipitor 40 mg QHS, Jardiance 10 mg QD, Hydralazine 25 mg Q8H, Isosorbide Dinitrate 10 mg TID, metoprolol succinate XL 12. 5 mg QD, torsemide 40 mg BID, Lantus 50U QD. Was previously on Aldactone but discontinued given renal function. 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    Coronary artery disease 2011    CORONARY STENT PLACEMENT FOLLOWED  BY [facility]    Depression     echocardiogram 02/2016, 11/2016, 09/2017, 12/20/2017    GERD (gastroesophageal reflux disease)     Heart attack 2011    and 2014    Hyperlipemia     Hypertension     ICD (implantable 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     PLACED 2014  [facility]  LAST INTERROGATION April 01 2018 REPORT REQUESTED    Ischemic cardiomyopathy     EF 15% ON ECHO 09-20-2017 DR GARG IN EPIC    Nonischemic cardiomyopathy     NSTEMI (non-ST elevated myocardial infarction)     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 No new acute intrathoracic process. Montine Circle, MD 12/24/2022 11:44 AM    X-ray chest AP portable    Result Date: 12/23/2022  Improved inspiratory effort with mild dependent atelectasis [name], MD 12/23/2022 8:16 AM    XR Chest AP Portable    Result Date: 12/23/2022   Airspace disease in the medial right lower lobe. Blanchard Mane 12/23/2022 12:46 AM    XR Chest AP Portable    Result Date: 12/22/2022  1. Right Swan-Ganz catheter tip in the proximal right lower pulmonary artery. No pneumothorax. 2. Mild vascular congestion, small right pleural effusion and right basilar atelectasis. Ardelia Mems, MD 12/22/2022 4:59 PM    Chest AP Portable    Result Date: 12/20/2022  No acute intrathoracic process. Montine Circle, MD 12/20/2022 9:12 PM      Social History:   Prior Level of Function:  Prior level of function: Independent with ADLs, Ambulates independently  Baseline Activity  Level: Community ambulation  Driving: independent  DME Currently at Home: Other (Comment) (none)    Home Living Arrangements:  Living Arrangements: Spouse/significant other  Type of Home: [place]: One level (1 STE)  Bathroom Shower/Tub: Walk-in shower  DME Currently at Home: Other (Comment) (none)      Subjective: "I feel okay."   Patient is agreeable to participation in the therapy session. Nursing clears patient for therapy.  Patient Goal: none stated    Pain Assessment  Pain Assessment: No/denies pain    Objective:   Observation of Patient/Vital Signs:  Patient is in bed with telemetry, RIJ PA catheter, peripheral IV in place.  Pt wore mask during therapy session:No      Cognitive Status and Neuro Exam:  Cognition/Neuro Status  Arousal/Alertness: Appropriate responses to stimuli  Attention Span: Appears intact  Orientation Level: Oriented X4  Memory: Appears intact  Following Commands: independent  Safety Awareness: minimal verbal instruction  Insights: Educated in safety awareness  Problem Solving: Assistance required to identify errors made  Behavior: attentive;calm;cooperative    Neuro Status  Behavior: attentive;calm;cooperative         Musculoskeletal Examination  Gross ROM  Right Upper Extremity ROM: within functional limits  Left Upper Extremity ROM: within functional limits    Gross Strength  Right Upper Extremity Strength: within functional limits  Left Upper Extremity Strength: within functional limits              Sensory/Oculomotor Examination  Sensory  Auditory: intact  Tactile - Light Touch: intact  Visual Acuity: intact         Activities of Daily Living  Self-care and Home Management  Eating: Independent  Grooming: Contact Guard Assist  Bathing: Minimal Assist  UB Dressing: Contact Guard Assist  LB Dressing: Maximal Assist  Toileting: Minimal Assist    Functional Mobility:  Mobility and Transfers  Supine to Sit: Contact Guard Assist  Sit to Stand: Contact Guard Assist;Minimal  Assist  Functional Mobility/Ambulation: Contact Guard Assist;Minimal Assist (no AD)     PMP Activity: Step 6 - Walks in Room     Balance  Balance  Static Sitting Balance: Stand by Assist  Dyanamic Sitting Balance: Doctor, hospital Standing Balance: Contact Guard Assist;Minimal Assist  Dynamic Standing Balance: Contact Guard Assist;Minimal Assist (no AD)    Participation and Activity Tolerance  Participation and Endurance  Participation Effort: good  Endurance:  (fair+)    Patient left with call bell within reach, all needs met, SCDs off as found, fall mat in place, chair alarm in place and all questions answered. RN notified of session outcome and patient response.       Goals:  Time For Goal Achievement: 5 visits  ADL Goals  Patient will groom self: Supervision, 5 visits  Patient will dress upper body: Supervision, 5 visits  Patient will dress lower body: Supervision, 5 visits  Patient will toilet: Supervision, 5 visits  Mobility and Transfer Goals  Pt will perform functional transfers: Supervision, 5 visits                           PPE worn during session: procedural mask and gloves    Tech present: n/a  PPE worn by tech: N/A    [name], OTR/L  Pager [phone]        Time of Treatment:   OT Received On: 12/24/22  Start Time: 1020  Stop Time: 1045  Time Calculation (min): 25 min

## 2022-12-24 NOTE — UM Notes (Signed)
Patient Name: [name] [name], [name] [name]  DOB: 11/28/1945  Unit: [facility] CICU  Level of Care:  ICU    Continued Stay Review  Identified [place] barriers: Medical Readiness  PT/OT Discharge Recommendation: Home with supervision    Brief Synopsis: 77 year old man history of nonischemic and ischemic cardiomyopathy, prior Medtronic ICD, coronary disease status post prior PCI with drug-eluting stent 2011. 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 Initially presented to [facility] with tachycardia, chest discomfort found in SVT. Patient underwent cardioversion x 1, adenosine with conversion to normal sinus rhythm following. Patient was initiated on dobutamine therapy for acute decompensated heart failure, transferred to [facility] for LVAD workup and evaluation previously underway.     CSR Date: 12/24/22    IV Lasix and received PO Zaroxolyn. IV Zosyn and Vancomycin. Dobutamine gtt. Heparin gtt. Amiodarone gtt stopped this morning, convert to PO.    Vitals:  Heart Rate:  [75-86]   Resp Rate:  [14-26]   BP: (99-127)/(55-74)   SpO2:  [95 %-100 %]   Height:  [182.9 cm (6' 0.01")]     Intake & Output:  I/O last 3 completed shifts:  In: 4440.49 [P.O.:1370; I.V.:2121.32; IV Piggyback:949.17]  Out: 4750 [Urine:4750]  I/O this shift:  In: 1350.03 [P.O.:480; I.V.:270.07; IV Piggyback:599.96]  Out: 600 [Urine:600]    Abnormal Labs:   12/24/22 03:01 12/24/22 09:12   Hemoglobin 10.8 (L) 10.8 (L)   Hemoglobin Total 10.7 (L) 10.7 (L)   Hematocrit 33.6 (L) 33.2 (L)   Hematocrit Total Calculated 32.9 (L) 33.1 (L)   Platelet Count 69 (L) 64 (L)   RBC 3.34 (L) 3.28 (L)      12/24/22 03:01   BUN 46.0 (H)   Creatinine 2.6 (H)   Sodium 132 (L)      12/24/22 03:01 12/24/22 09:12   AST 80 (H) 72 (H)   ALT 257 (H) 241 (H)     Diagnostics:  X-ray chest AP portable  Result Date: 12/24/2022  Stable exam. No new  acute intrathoracic process. Montine Circle, MD 12/24/2022 11:44 AM     Medications:  Scheduled Meds:  Current Facility-Administered Medications   Medication Dose Route Frequency    amiodarone  400 mg Oral TID    aspirin EC  81 mg Oral Daily    atorvastatin  40 mg Oral QHS    hydrALAZINE  50 mg Oral Q8H [place]    insulin glargine  10 Units Subcutaneous QAM    Or    insulin glargine  5 Units Subcutaneous QAM    insulin lispro  1-4 Units Subcutaneous QHS    insulin lispro  1-8 Units Subcutaneous TID AC    iron sucrose  200 mg Intravenous Q24H [place]    isosorbide dinitrate  10 mg Oral TID - Nitrate Free Interval    niacin  500 mg Oral BID    polyethylene glycol  17 g Oral Daily    senna-docusate  1 tablet Oral Q12H SCH   Zosyn 4.5 g IV x 1  Vancomycin 1,500 mg IV x 1    Continuous Infusions:   DOBUTamine 5 mcg/kg/min (12/24/22 1200)    heparin infusion 25,000 units/500 mL (VTE/Moderate Intensity) 18 Units/kg/hr (12/24/22 1200)     IV PRN & One-Time Medications over last 24 hours:  Lasix 200 mg IV x 1    Assessment/Plan:  Cardiac Critical Care:  Heart failure-cardiogenic  shock, SCAI stage C  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 Concern for kidney function but it is improving with diuresis and closer to his baseline creatinine in 03/2022 was 1.5   In case of deterioration: IABP as above  GDMT for Heart failure  Beta blocker: Hold in setting of cardiogenic shock  ACE/ARB/ARNI: Hold in setting of worsening renal function  MRA: Hold in setting of worsening renal function.  Previously has had hyperkalemia issues  SGLT2i: Hold for possible procedures, and  reduced renal function  Ectopy   Convert IV amiodarone to PO amiodarone    Iron deficit anemia   Iron studies (3/6): Iron sat 10, Ferritin 57  IV iron repletion (12/23/22-12/28/22)   Thrombocytopenia   Consult to hematology regarding thrombocytopenia and hx of CLL given likelihood of Impella/VAD and need for future anticoagulation      Right lower extremity DVT  Likely provoked in the setting of cancer  C/ heparin infusion pending HIT panel   Rest of care per [facility]     Oncology/Hematology:  1.  Hold the Calquence as it does have an anticoagulant effect and may  contribute to arrhythmia. At this moment from a CLL standpoint, he is quite stable.  2.  Thrombocytopenia, worsening thrombocytopenia, HIT workup has been  negative. Certainly could add serotonin release assay to increase although  false negative is incredibly low.  Other etiologies may be consumptive in   this acute setting and certainly possibly autoimmune.  We will certainly  monitor, hold off on intervention at this point, may require steroids.  We  will do a manual smear as well.    Primary Payor: C[employer] / Plan: CIGNA POS / Product Type: COMMERCIAL /   UTILIZATION REVIEW CONTACT: Robyne Askew RN, BSN  Utilization Review   [place]  [phone]  [phone]  Email: [email or web address]  Tax IDKU:[id number]      NOTES TO REVIEWER:     This clinical review is based on/compiled from documentation provided by the treatment team within the patient's medical record.

## 2022-12-24 NOTE — Progress Notes (Signed)
Patient Type: I    PHYSICIAN/PROVIDER: Bernetha Anschutz Patel-Donnelly, MD    December 23, 1448    A 77 year old gentleman followed by Dr. Mervin Kung of [facility]   with CLL on acalabrutinib has been well controlled, platelet count was in   the 200 range, with a white count in the 15 range in December 2023, and now  has worsening cardiomyopathy. Has an ICD in person as well as well as a   stent and has escalating symptoms now, was admitted to [facility] with  tachycardia found to be in SVT, status post cardioversion x1 and is here   for evaluation for possible LVAD.  He is currently on dobutamine, platelets  have gone down from baseline of nearly 200 down to 69 yesterday and 64   today.  Of note, platelet count on admission was 161.    PHYSICAL EXAMINATION:  VITAL SIGNS:  He is afebrile, vitals stable.  LUNGS:   Crackles in the bases and occasional wheeze.  CARDIAC:  Regular.  ABDOMEN:  Soft.  EXTREMITIES:  No clubbing, cyanosis and trace edema.    LABORATORY DATA:  WBC is 6.13, hemoglobin 10.8, platelet count of 69, BUN   and creatinine are 46/2.6.    ASSESSMENT AND PLAN:  A 77 year old gentleman with CLL on a BTK inhibitor with worsening   thrombocytopenia in the setting of  cardiomyopathy with SVT, awaiting LVAD   evaluation.    ONCOLOGY:  1.  Hold the Calquence as it does have an anticoagulant effect and may   contribute to arrhythmia. At this moment from a CLL standpoint, he is quite  stable.  2.  Thrombocytopenia, worsening thrombocytopenia, HIT workup has been   negative. Certainly could add serotonin release assay to increase although   false negative is incredibly low.  Other etiologies may be consumptive in   this acute setting and certainly possibly autoimmune.  We will certainly   monitor, hold off on intervention at this point, may require steroids.  We   will do a manual smear as well.      D: 12/24/2022 11:02 AM by Precious Reel, MD ([id number])  T: 12/24/2022 11:23 AM by ga [id number] (Conf:  [id number]) (Doc ID:   [id number])

## 2022-12-24 NOTE — PT Eval Note (Signed)
Physical Therapy Evaluation  [name] [name]      Post Acute Care Therapy Recommendations:     Discharge Recommendations:  Home with supervision  D/C Milestones: progress mobility as tolerated Anticipate achievement in 2 to 3 sessions    DME needs IF patient is discharging home: Front wheel walker    Therapy discharge recommendations may change with patient status.  Please refer to most recent note for up-to-date recommendations.    Assessment:   Significant Findings: none    [name] [name] is a 77 y.o. male admitted 12/21/2022 with worsening shortness of breath and palpitations, subsequently found to be in SVT.  Patient presents with decreased strength, decreased activity tolerance, impaired balance and gait deficits. Pt is able to ambulate 75 ft, 2 times, with IV pole support, min A, pt had 1 loss of balance. Pt would benefit from continued acute skilled physical therapy services to address these deficits and maximize independence with functional mobility. Recommend D/C to home with supervision.      Impairments: Assessment: Decreased LE strength;Decreased UE strength;Decreased safety/judgement during functional mobility;Decreased endurance/activity tolerance;Impaired coordination;Decreased functional mobility;Decreased balance;Gait impairment.     Therapy Diagnosis: Impaired functional mobility     Rehabilitation Potential: Prognosis: Good;With continued PT status post acute discharge    Treatment Activities: Physical therapy evaluation, transfer training, gait training, pt education and discharge planning     Educated the patient to role of physical therapy, plan of care, goals of therapy and HEP, safety with mobility and ADLs, energy conservation techniques, discharge instructions, home safety.    Plan:   Treatment/Interventions: Exercise, Gait training, Neuromuscular re-education, Functional transfer training, LE strengthening/ROM, Endurance training, Patient/family training, Equipment  eval/education, Bed mobility     PT Frequency: 2-3x/wk   Risks/Benefits/POC Discussed with Pt/Family: With patient       Unit: [place] CICU  Bed: FI102/FI102-01     Precautions and Contraindications:   Other Precautions: falls risk    Consult received for [name] [name] for PT Evaluation and Treatment.  Patient's medical condition is appropriate for Physical therapy intervention at this time.    Medical Diagnosis: Cardiogenic shock [R57.0]  Heart failure [I50.9]      History of Present Illness:   [name] [name] is a 77 y.o. male admitted on 12/21/2022 with "PMHx NICM/ICM s/p Med[employer]ik ICD (placed 2013, replaced 2018), CAD s/p PCI with DES 2011, HLD, T2DM, COPD, stage IV CLL, CKD, RLE DVT on Eliquis who presents to [facility] ED on night of 03/03 with dyspnea, burning chest pain.  Found to be in SVT with HR 158 BPM, received unsuccessful cardioversion at 120 J.  Subsequently given adenosine 6 mg IV x 1, rhythm slowed and was found to be in atrial sensed ventricular paced rhythm with rate less than 100 BPM. Admitted to ICU at [facility] for further management. However, then transferred to [place] on new Dobutamine 2.5 mcg/kg/min for LVAD consideration.      Of note follows with [place] [facility]. Has been followed by [facility] for advanced therapies, including LVAD where he has been seen by Dr. Alain Marion. Home medications include Eliquis 2.5 mg BID, ASA 81 mg QD, Lipitor 40 mg QHS, Jardiance 10 mg QD, Hydralazine 25 mg Q8H, Isosorbide Dinitrate 10 mg TID, metoprolol succinate XL 12. 5 mg QD, torsemide 40 mg BID, Lantus 50U QD. Was previously on Aldactone but discontinued given renal function. Dry weight is around 185 lb per 10/2022, weight today around 195-199 lb,"  per chart review.    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    Coronary artery disease 2011    CORONARY STENT PLACEMENT FOLLOWED BY [facility]    Depression     echocardiogram 02/2016, 11/2016, 09/2017, 12/20/2017    GERD (gastroesophageal reflux disease)     Heart attack 2011    and 2014    Hyperlipemia     Hypertension     ICD (implantable 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     PLACED 2014  [facility]  LAST INTERROGATION April 01 2018 REPORT REQUESTED    Ischemic cardiomyopathy     EF 15% ON ECHO 09-20-2017 DR GARG IN EPIC    Nonischemic cardiomyopathy     NSTEMI (non-ST elevated myocardial infarction)     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    Pacemaker 2014    MEDTRONIC ICD/PACEMAKER COMBO LAST INTERROGATION  12-12 2018    Pneumonia 09/2016    Primary cardiomyopathy     Ischemic cardiomyopathy EF 15% ON ECHO 09-20-2017 DR GARG IN EPIC    Sepsis 08/2017    Syncope and collapse     PRIOR TO ICD/PACEMAKER PLACED 2014    Type 2 diabetes mellitus, controlled DX 1998    BS  AVG 100  A1C 7.4 Dec 27 2017    [place]     PT SEE HIS PMD April 01 2018 RE THIS-TOLD TO SEE PMD BY [facility] JUNE 12 WHEN THEY SAW HIM FOR A CL        X-Rays/Tests/Labs:  X-ray chest AP portable    Result Date: 12/24/2022  Stable exam. No new acute intrathoracic process. Montine Circle, MD 12/24/2022 11:44 AM    X-ray chest AP portable    Result Date: 12/23/2022  Improved inspiratory effort with mild dependent atelectasis [name], MD 12/23/2022 8:16 AM    XR Chest AP Portable    Result Date: 12/23/2022   Airspace disease in the medial right lower lobe. Blanchard Mane 12/23/2022 12:46 AM    XR Chest AP Portable    Result Date: 12/22/2022  1. Right Swan-Ganz catheter tip in the proximal right lower pulmonary artery. No pneumothorax. 2. Mild vascular  congestion, small right pleural effusion and right basilar atelectasis. Ardelia Mems, MD 12/22/2022 4:59 PM    Chest AP Portable    Result Date: 12/20/2022  No acute intrathoracic process. Montine Circle, MD 12/20/2022 9:12 PM    Social History:   Prior Level of Function:  Prior level of function: Independent with ADLs, Ambulates independently  Baseline Activity Level: Community ambulation  Driving: independent  DME Currently at Home: Glucometer, Scale    Home Living Arrangements:  Living Arrangements: Spouse/significant other  Type of Home: House  Home Layout: One level (1 STE)  Bathroom Shower/Tub: Walk-in shower  DME Currently at Home: Glucometer, Scale    Subjective: Pt states, "I can go."   Patient is agreeable to participation  in the therapy session. Nursing clears patient for therapy.     Patient Goal: None stated    Pain Assessment  Pain Assessment: No/denies pain     Objective:   Observation of Patient/Vital Signs:  Patient is seated in a bedside chair with telemetry, R IJ and peripheral IV in place.  Pt wore mask during therapy session:No      Observation of Patient/Vital signs:       Cognition/Neuro Status  Arousal/Alertness: Appropriate responses to stimuli  Attention Span: Appears intact  Orientation Level: Oriented X4  Memory: Appears intact  Following Commands: Follows all commands and directions without difficulty  Safety Awareness: independent  Insights: Fully aware of deficits  Problem Solving: Able to problem solve independently  Motor Planning: intact  Coordination: intact    Musculoskeletal Examination:  Gross ROM  Right Upper Extremity ROM: within functional limits  Left Upper Extremity ROM: within functional limits  Right Lower Extremity ROM: within functional limits  Left Lower Extremity ROM: within functional limits    Gross Strength  Right Upper Extremity Strength: within functional limits  Left Upper Extremity Strength: within functional limits  Right Lower Extremity Strength: 4-/5  Left Lower  Extremity Strength: 4-/5         Functional Mobility:  Sit to Stand: Minimal Assist  Stand to Sit: Minimal Assist         Ambulation:  PMP - Progressive Mobility Protocol   PMP Activity: Step 7 - Walks out of Room  Distance Walked (ft) (Step 6,7): 150 Feet (75 ft, 2 times, seated rest breaks)     Ambulation: Minimal Assist (1 loss of balance, therapist assist to corret)  Pattern: decreased cadence;decreased step length     Balance:  Sitting - Static: Good  Sitting - Dynamic: Good  Standing - Static: Fair  Standing - Dynamic: Fair         Participation and Activity Tolerance:  Participation Effort: good  Endurance: Tolerates < 10 min exercise, no significant change in vital signs      Patient left with call bell within reach, all needs met, SCDs not in place, fall mat in place, bed alarm n/a, chair alarm on and all questions answered. RN notified of session outcome and patient response.       Goals:   Goals  Goal Formulation: With patient  Time for Goal Acheivement: 5 visits  Goals: Select goal  Pt Will Go Supine To Sit: with supervision, to maximize functional mobility and independence  Pt Will Perform Sit To Supine: with supervision, to maximize functional mobility and independence  Pt Will Perform Sit to Stand: with supervision, to maximize functional mobility and independence  Pt Will Ambulate: > 200 feet, with rolling walker, with supervision, to maximize functional mobility and independence (w/ LRAD)  Pt Will Go Up / Down Stairs: 1-2 stairs, with supervision, With rail, to maximize functional mobility and independence       PPE worn during session: gloves  Tech present: No  PPE worn by tech: N/A    Time of treatment:   PT Received On: 12/24/22  Start Time: [id number]  Stop Time: 1115  Time Calculation (min): 30 min     Hal Hope, DPT, pager [phone]

## 2022-12-24 NOTE — Progress Notes (Signed)
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 Choose heart healthy foods and activity as tolerated, recommend the D.A.S.H. diet.   Refer to outpatient nutrition: yes - to help maximize nutrition once discharge d/t report of low appetite    L-VAD/Transplant Nutrition Recommendations: Mr Dahmen is a moderate nutrition risk. he scored as Frail in the [employer] Frailty Assessment d/t exhaustion, daily activity and previous walking pace, and is categorized as Mildly Frail per Clinical Frailty Scale d/t low energy, current medical condition. He does not meet criteria for malnutrition but is a higher risk d/t lower appetite, restrictive diet order and current medical condition. Additional nutrition/functional related items: reported needing to stop and rest when walking up a flight of stairs starting a few years ago, has since moved to a home with main floor living vs the 3 story town home.    ASPEN Malnutrition Guide: Does not meet criteria (at risk: medical condition, lower appetite and current restrictive diet)  Clinical Frailty Scale: 5 Mildly Frail [These people often have more evidence of slowing down and need help in high order IADLs (finances, transportation, heavy housework, medication management). Typically, mild frailty progressively impairs shopping and walking outside alone, meal preparation and housework.]  Personal Health Rating: Fair  Fried Frailty Index: Frail (exhaustion, daily activity, previous walking pace)                                    Frailty Assessment:  Fried Frailty Index  Scores: 0= Non Frail, 1-2= Pre Frail, 3-5= Frail    Shrinking  0   Exhaustion  1   Physical Activity   1   Walking Speed   1 (7.3 sec at initial eval in Feb, currently not  mobile)   Grip Strength   0 (34%-R hand)   TOTAL SCORE   3 Frail     Physical Assessment:   Head: temple region: slight depression with decrease in muscle tone/resistance (mild muscle loss - temporalis) and orbital region: slightly dark circles, somewhat hollow look, some decrease in bounce back of fat pads (mild fat loss)  Upper Body: no overt s/s subcutaneous muscle or fat loss  Lower Body: no s/s subcutaneous fat or muscle loss  Edema: being actively diuresed  GI function: Report having some nausea without emesis for the past couple of week and stated it limit his PO intake but no report of diarrhea or constipation        Anthropometrics:  Height: 182.9 cm (6' 0.01")  Weight: 93.1 kg (205 lb 4 oz)  Body mass index is 27.83 kg/m.    UBW: 220#     IBW range: 62-83kg    Assessment/Visit:  This Probation officer had the pleasure meeting with Ramandeep Fiqueroa is a(n) 77 y.o. y/o male who was evaluated from a nutrition perspective for an advanced cardiac surgical therapy evaluation while admitted to [facility]. Per frailty protocol patient's frailty was assessed as medical feasible. Frailty results are listed above. Mr Nearhoof was educated on nutrition guidelines and expectations post LVAD including Vitamin K-Coumadin drug-nutrient interaction at his initial evaluation as an outpatient.     Intake/Nutrition Hx:  Patient stated prior to the hospitalization he had a fair but lower appetite. He stated he eats 2 meals and a snack daily. He reported no unintentional weight loss and stated his UBW was around 220# a few years ago. Patient stated he does follow any type of eating pattern and eats out 2-5 times per week. See below for detailed patient average daily PO intake prior to hospitalization.     Estimated Needs: (based on IBW of 83kg)  Calories: 1610-9604 (25-30 kcal/kg) ; Protein: 100-125gm (1.2-1.5 gm/kg)     Eating Pattern:  Bkfst: 2 eggs (scrambled or boiled) 2 slices of bacon, toast and jam and a glass of  orange juice (will eat out 2-3 x/week)  Lunch: a banana or mango  Dinner: Beef, Fish, Lamb with potatoes, greens, green beans, broccoli, corn  Snacks: none reported  Beverages: water, tea, orange or apple juice    Pt stated his appetite while admitted to [facility] has been fair and stated he has been admitted since Sunday but has only had 2 meals.  He stated he ate 100% of his dinner but only about 50% of today's breakfast.      Current Nutrition Rx: Cardiac/Renal   Current Fluid Restriction: 2000 mL    Diabetes: Yes, follow with PCP takes 50 units of Lantus QHS, checks his BG QD and average range is 100-120mg /dL ; [id number] 1.9%  Kidney Disease: Yes - was referred to nephrology by PCP but has not had appointment with the neprhologist  Past Medical History:   Diagnosis Date    Acute CHF     NOV  & DEC 2018, - DIALYSIS CATH PLACED Aug 24 2017 TO REMOVE FLUID     Acute systolic (congestive) heart failure 08/2017    Anxiety     CAD (coronary artery disease)     Cardiomyopathy     nonischemic    CHF (congestive heart failure) 08/12/2014, 2013    Chronic obstructive pulmonary disease     POSSIBLE PER PT HE USES  SYNBICORT BID ABD  BREO INHALER PRN    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    Coronary artery disease 2011    CORONARY STENT PLACEMENT FOLLOWED BY [facility]    Depression     echocardiogram 02/2016, 11/2016, 09/2017, 12/20/2017    GERD (gastroesophageal reflux disease)     Heart attack 2011    and 2014    Hyperlipemia     Hypertension     ICD (implantable 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     PLACED 2014  [facility]  LAST INTERROGATION April 01 2018 REPORT REQUESTED    Ischemic cardiomyopathy     EF 15% ON ECHO 09-20-2017 DR GARG IN EPIC    Nonischemic cardiomyopathy     NSTEMI (non-ST elevated myocardial infarction)     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    Pacemaker 2014    MEDTR[employer]IC ICD/PACEMAKER COMBO LAST INTERROGATION  12-12 2018    Pneumonia 09/2016    Primary cardiomyopathy     Ischemic cardiomyopathy EF  15% ON ECHO 09-20-2017 DR GARG IN EPIC    Sepsis 08/2017    Syncope and collapse     PRIOR TO ICD/PACEMAKER PLACED 2014    Type 2 diabetes mellitus, controlled DX 1998    BS  AVG 100  A1C 7.4 Dec 27 2017    Wheeze     PT SEE HIS PMD April 01 2018 RE THIS-TOLD TO SEE PMD BY [facility] JUNE 12 WHEN THEY SAW  HIM FOR A CL       Nutrition Surgery/Eating Disorder Hx:  none reported    Activity  Patient reported an energy level of 6-7 out of 10.  Patient reported being able to walk 1/2 block before needing to stop and rest. Patient stated he is not able to walk up a flight of stairs without resting in the middle and reported having no longer having stairs in his home. Patient reported  SOB with exertion and stated he needs to sleep on 2 pillows at times d/t SOB.  Physical Activity:  none    Detention/Allergies:  Pt reported having all of their natural teeth (potentially missing Wisdom teeth) and statedno pain or difficulty with biting, chewing or swallowing. Pt stated he is not allergic to any known foods.    Relevant Labs:   Recent Labs   Lab 12/24/22  0301 12/23/22  2109 12/23/22  1602 12/23/22  1015 12/23/22  0334 12/22/22  2133 12/22/22  1505 12/22/22  0424 12/21/22  2247 12/21/22  1837   Sodium 132* 133* 133* 135 133*  More results in Results Review 137  More results in Results Review 135 135   Potassium 4.1 3.9 4.1 3.8 3.9  More results in Results Review 3.8  More results in Results Review 4.9 4.8   Chloride 99 99 100 101 100  More results in Results Review 101  More results in Results Review 101 103   CO2 21 22 24 21 24   More results in Results Review 23  More results in Results Review 17 19   BUN 46.0* 49.0* 50.0* 49.0* 59.0*  More results in Results Review 59.0*  More results in Results Review 61.0* 60.0*   Creatinine 2.6* 2.8* 2.8* 2.8* 3.1*  More results in Results Review 3.4*  More results in Results Review 4.2* 4.1*   Glucose 276* 297* 266* 222* 248*  More results in Results Review 168*  More results in Results  Review 199* 167*   Calcium 8.5 8.3 8.3 8.3 8.0  More results in Results Review 8.8  More results in Results Review 8.7 8.9   Magnesium 2.2 2.2 2.3  --  2.4  --  2.6  More results in Results Review 2.9* 2.8*   Phosphorus  --   --   --   --   --   --   --   --  7.2* 7.4*   eGFR 24.8* 22.7* 22.7* 22.7* 20.1*  More results in Results Review 18.0*  More results in Results Review 13.9* 14.3*   More results in Results Review = values in this interval not displayed.     Recent Labs   Lab 12/24/22  0912 12/24/22  0301 12/23/22  2109 12/23/22  1602 12/23/22  1015   Albumin 3.0* 3.0* 2.9* 2.9* 2.9*     HgbA1c: 7.7 (12/21/22)  Prealbumin 21.7 (09/10/22)  Lipid Panel: Chol  106, HDL 28(L) , LDL 63 , TG 76       Intake/Output Summary (Last 24 hours) at 12/24/2022 1204  Last data filed at 12/24/2022 1100  Gross per 24 hour   Intake 3034.99 ml   Output 2625 ml   Net 409.99 ml     LBM: today (3*7/24)  Admission total: -4L    Pertinent Medications/Herbal Supplements/ Vitamins:   None reported    Nutrition Dx: Food and nutrition related knowledge deficit r/t post L-VAD/heart transplant a/e/b no prior knowledge of L-VAD/transplant diet guidelines.    Understanding and Motivation  to Follow Recommendations: yes  Nutrition Recommendations:  Choose Lean protein, whole grains, vegetables, fruits and water as choice of beverage   Follow low salt eating pattern   Maintain a healthy weight, goal BMI >20 and <30  Engage in daily physical activity as as medically appropriate and approved by your AHF doctor.    Intervention:  Nutrition Rx: DASH/Heart Healthy: low sodium    Provided Vitamin K consistent eating pattern education - previously provided  Provided Heart Healthy Nutrition education (verbal and written) - previously provided    M/E:  Provide Advanced Heart Failure Team nutrition recommendations above for listing  If listed, will follow patient as an Advanced Heart Failure team nutrition consult     Answered all pt questions and pt verbalized  understanding and was provided with this writer's contact information.    Thank you for the opportunity to be involved with Mr Weare' care,  Hamilton Capri, MS, RD, CCTD  Thoracic Transplant Dietitian  Spectra/Office: [phone]  Via Epic Secure Chat: Direct or Group: [facility] Dietit[facility]

## 2022-12-24 NOTE — Consults (Signed)
Vancomycin Day #3  Indication: Pneumonia    Other Antibiotics: Zosyn  Recent Labs     12/24/22  0301 12/23/22  2109 12/23/22  1602 12/23/22  1015 12/23/22  0334 12/22/22  0424 12/21/22  2247   WBC 6.13  --   --   --  7.18  --  13.69*   Creatinine 2.6* 2.8* 2.8*   < > 3.1*   < > 4.2*   BUN 46.0* 49.0* 50.0*   < > 59.0*   < > 61.0*    < > = values in this interval not displayed.     CrCl: 26.5 ml/min (using actual SCr)  Cultures: The blood culture taken on 12/21/2022 has growth of gram positive cocci. MRSA culture taken on 12/23/2022 is currently in process. Covid test taken on 12/21/2022 came back negative  Goal Trough:  Weight: 93.1 kg    The vancomycin level was taken at 2109 on 12/23/2022. The level came back at 11.9. A one time order of vancomycin '1500mg'$  has been ordered. I will order for a vancomycin level to be taken with the morning labs on 12/25/2022. Please follow up.       Thank you for consulting with the pharmacy. We will continue to monitor the patient's renal function, vancomycin dose, and vancomycin levels.    Tobi Bastos, RPh  [phone]

## 2022-12-24 NOTE — Progress Notes (Signed)
[facility].  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  -CAD with past PCI   -Mild hyponatremia due to hypervolemia   -Non gap acidosis due to CKD                Plan:     -No need for RRT at this time   -recommend low rate lasix drip for a more predictable and stable diuresis   -supportive care   -please try to avoid contrast studies and nephrotoxins   -daily labs       [name], MD  Epic Secure Chat: direct or group "[facility]  [place] [phone]  Office - [phone]  ++++++++++++++++++++++++++++++++++++++++++++++++++++++++++++++  Chief Complaint:    Interval History:    Medications:  Scheduled Meds:  Current Facility-Administered Medications   Medication Dose Route Frequency    amiodarone  400 mg Oral TID    aspirin EC  81 mg Oral Daily    atorvastatin  40 mg Oral QHS    hydrALAZINE  50 mg Oral Q8H [place]    insulin glargine  10 Units Subcutaneous QAM    Or    insulin glargine  5 Units Subcutaneous QAM    insulin lispro  1-4 Units Subcutaneous QHS    insulin lispro  1-8 Units Subcutaneous TID AC    iron sucrose  200 mg Intravenous Q24H [place]    isosorbide dinitrate  10 mg Oral TID - Nitrate Free Interval    niacin  500 mg Oral BID    polyethylene glycol  17 g Oral Daily    senna-docusate  1 tablet Oral Q12H [place]     Continuous Infusions:   DOBUTamine 5 mcg/kg/min (12/24/22 1300)    heparin infusion 25,000 units/500 mL (VTE/Moderate Intensity) 18 Units/kg/hr (12/24/22 1300)     PRN Meds:acetaminophen, dextrose **OR** dextrose **OR** dextrose **OR** glucagon (rDNA),  phenol    Objective:  Vital signs in last 24 hours:  Heart Rate:  [75-86] 80  Resp Rate:  [14-26] 19  BP: (100-127)/(55-74) 112/69  Intake/Output last 24 hours:    Intake/Output Summary (Last 24 hours) at 12/24/2022 1343  Last data filed at 12/24/2022 1300  Gross per 24 hour   Intake 3139.29 ml   Output 2745 ml   Net 394.29 ml     Intake/Output this shift:  I/O this shift:  In: 1397.53 [P.O.:480; I.V.:317.57; IV Piggyback:599.96]  Out: 45 [Urine:820]    Physical Exam:   Gen: Well developed, no acute distress   CV: S1 S2 N RRR, no edema   Chest: Good effort, CTAB    Labs:  Recent Labs   Lab  12/24/22  0912 12/24/22  0301 12/23/22  2109 12/23/22  1602 12/22/22  0424 12/21/22  2247 12/21/22  1837   Glucose  --  276* 297* 266*  More results in Results Review 199* 167*   BUN  --  46.0* 49.0* 50.0*  More results in Results Review 61.0* 60.0*   Creatinine  --  2.6* 2.8* 2.8*  More results in Results Review 4.2* 4.1*   Calcium  --  8.5 8.3 8.3  More results in Results Review 8.7 8.9   Sodium  --  132* 133* 133*  More results in Results Review 135 135   Potassium  --  4.1 3.9 4.1  More results in Results Review 4.9 4.8   Chloride  --  99 99 100  More results in Results Review 101 103   CO2  --  '21 22 24  '$ More results in Results Review 17 19   Albumin 3.0* 3.0* 2.9* 2.9*  More results in Results Review 3.1*  --    Phosphorus  --   --   --   --   --  7.2* 7.4*   Magnesium  --  2.2 2.2 2.3  More results in Results Review 2.9* 2.8*   More results in Results Review = values in this interval not displayed.     Recent Labs   Lab 12/24/22  0912 12/24/22  0301 12/23/22  0334   WBC 5.49 6.13 7.18   Hgb 10.8* 10.8* 10.8*   Hematocrit 33.2* 33.6* 33.5*   MCV 101.2* 100.6* 103.7*   MCH 32.9 32.3 33.4   MCHC 32.5 32.1 32.2   RDW 16* 15 15   MPV 12.1 13.0* 12.7*   Platelets 64* 69* 81*

## 2022-12-24 NOTE — Plan of Care (Signed)
Problem: Compromised Sensory Perception  Goal: Sensory Perception Interventions  Outcome: Progressing     Problem: Compromised Moisture  Goal: Moisture level Interventions  Outcome: Progressing     Problem: Compromised Activity/Mobility  Goal: Activity/Mobility Interventions  Outcome: Progressing     Problem: Compromised Nutrition  Goal: Nutrition Interventions  Outcome: Progressing     Problem: Compromised Friction/Shear  Goal: Friction and Shear Interventions  Outcome: Progressing     Problem: Moderate/High Fall Risk Score >5  Goal: Patient will remain free of falls  Outcome: Progressing     Problem: Hemodynamic Status: Cardiac  Goal: Stable vital signs and fluid balance  Outcome: Progressing     Problem: Inadequate Gas Exchange  Goal: Adequate oxygenation and improved ventilation  Outcome: Progressing

## 2022-12-24 NOTE — SLP Progress Note (Signed)
[facility]   SLP Treatment Note  Patient: [name] [name]    MRN#: [id number]  Room: FI102/FI102-01    Treatment Type: dysphagia management    Recommendations/Plan:   - Diet Recommendations: reg/thin  Recommended Form of Meds: PO, whole, with liquid, with puree  - Aspiration Precautions: PO when awake/alert/seated upright, small bites/sips, slow rate of intake    SLP Frequency Recommended: (SLP to d/c)    Discharge recommendations: Defer to PT/OT recommendation    Assessment:   Patient seen for dysphagia management. Patient alert/awake resting comfortably in bedside chair on room air. Good participation in oral trials. Oral phase of swallow effective across consistencies trialed. Consistent hyolaryngeal movement palpated. No s.s of aspiration noted at this time. Recommend continue reg/thin diet with oral meds as tolerated. No further skilled SLP intervention indicated. SLP to complete orders.     Subjective:   Pain: denies  RN cleared patient for session; reports patient tolerating current diet well. Patient is alert and agreeable to session. No family at bedside.     Objective:   Observation of Patient/Vital Signs:  Patient is seated in a bedside chair with dressings and telemetry in place.  Current Diet: reg/thin  Respiratory Status: room air  Precautions: bleeding  Interpreter services: N/A  Patient was wearing a mask: No      Oral care/assesment: adequate for oral care  PO trials provided: thin via cup/straw x1 oz, solids x1oz  Oral phase: adequate lip seal, good oral control, effective/slow mastication, mild oral residue; clears with liquid wash  Pharyngeal phase (on palpation): consistent swallow initiation, with hyolaryngeal elevation, 1-2 swallows per bolus  S/s aspiration: none  Vitals: WFL     Patient Education: recommendations, POC    Patient left with call bell within reach, all needs met, fall mat in place and bed alarm activated and all questions answered. RN notified of session outcome  and patient response.    Goals:   Patient will demonstrate effective oral phase skills without s/s of aspiration on a diet of regular / thin for 24-48 hours. MET    Lavonia Drafts, M.S.Ed., CCC-SLP  Raj Janus, M.Ed., CCC-SLP   PPE Worn by Provider: gloves and face mask     Time of Treatment:  SLP Received On: 12/24/22  Start Time: [id number]  Stop Time: 1400  Time Calculation (min): 10 min

## 2022-12-25 ENCOUNTER — Inpatient Hospital Stay: Payer: Commercial Managed Care - POS

## 2022-12-25 ENCOUNTER — Encounter
Admission: AD | Disposition: A | Discharge status: Home Health | Payer: Self-pay | Source: Other Acute Inpatient Hospital | Attending: Critical Care Medicine

## 2022-12-25 ENCOUNTER — Encounter: Payer: Self-pay | Admitting: Cardiovascular Disease

## 2022-12-25 HISTORY — PX: TUNNELED CATH PLACEMENT (PERMCATH): IMG2664

## 2022-12-25 LAB — CBC AND DIFFERENTIAL
Absolute NRBC: 0.06 10*3/uL — ABNORMAL HIGH (ref 0.00–0.00)
Absolute NRBC: 0.03 10*3/uL — ABNORMAL HIGH (ref 0.00–0.00)
Basophils Absolute Automated: 0 10*3/uL (ref 0.00–0.08)
Basophils Absolute Automated: 0.01 10*3/uL (ref 0.00–0.08)
Basophils Automated: 0 %
Basophils Automated: 0.2 %
Eosinophils Absolute Automated: 0.1 10*3/uL (ref 0.00–0.44)
Eosinophils Absolute Automated: 0.1 10*3/uL (ref 0.00–0.44)
Eosinophils Automated: 1.7 %
Eosinophils Automated: 1.6 %
Hematocrit: 31.3 % — ABNORMAL LOW (ref 37.6–49.6)
Hematocrit: 30.1 % — ABNORMAL LOW (ref 37.6–49.6)
Hgb: 10.2 g/dL — ABNORMAL LOW (ref 12.5–17.1)
Hgb: 9.9 g/dL — ABNORMAL LOW (ref 12.5–17.1)
Immature Granulocytes Absolute: 0.05 10*3/uL (ref 0.00–0.07)
Immature Granulocytes Absolute: 0.05 10*3/uL (ref 0.00–0.07)
Immature Granulocytes: 0.9 %
Immature Granulocytes: 0.8 %
Instrument Absolute Neutrophil Count: 3.99 10*3/uL (ref 1.10–6.33)
Instrument Absolute Neutrophil Count: 4.22 10*3/uL (ref 1.10–6.33)
Lymphocytes Absolute Automated: 1.43 10*3/uL (ref 0.42–3.22)
Lymphocytes Absolute Automated: 1.85 10*3/uL (ref 0.42–3.22)
Lymphocytes Automated: 24.9 %
Lymphocytes Automated: 28.9 %
MCH: 32.9 pg (ref 25.1–33.5)
MCH: 32.6 pg (ref 25.1–33.5)
MCHC: 32.6 g/dL (ref 31.5–35.8)
MCHC: 32.9 g/dL (ref 31.5–35.8)
MCV: 101 fL — ABNORMAL HIGH (ref 78.0–96.0)
MCV: 99 fL — ABNORMAL HIGH (ref 78.0–96.0)
MPV: 12.6 fL — ABNORMAL HIGH (ref 8.9–12.5)
MPV: 12.2 fL (ref 8.9–12.5)
Monocytes Absolute Automated: 0.18 10*3/uL — ABNORMAL LOW (ref 0.21–0.85)
Monocytes Absolute Automated: 0.17 10*3/uL — ABNORMAL LOW (ref 0.21–0.85)
Monocytes: 3.1 %
Monocytes: 2.7 %
Neutrophils Absolute: 3.99 10*3/uL (ref 1.10–6.33)
Neutrophils Absolute: 4.22 10*3/uL (ref 1.10–6.33)
Neutrophils: 69.4 %
Neutrophils: 65.8 %
Nucleated RBC: 1 /100 WBC — ABNORMAL HIGH (ref 0.0–0.0)
Nucleated RBC: 0.5 /100 WBC — ABNORMAL HIGH (ref 0.0–0.0)
Platelets: 71 10*3/uL — ABNORMAL LOW (ref 142–346)
Platelets: 69 10*3/uL — ABNORMAL LOW (ref 142–346)
RBC: 3.1 10*6/uL — ABNORMAL LOW (ref 4.20–5.90)
RBC: 3.04 10*6/uL — ABNORMAL LOW (ref 4.20–5.90)
RDW: 16 % — ABNORMAL HIGH (ref 11–15)
RDW: 16 % — ABNORMAL HIGH (ref 11–15)
WBC: 5.75 10*3/uL (ref 3.10–9.50)
WBC: 6.4 10*3/uL (ref 3.10–9.50)

## 2022-12-25 LAB — COMPREHENSIVE METABOLIC PANEL
ALT: 207 U/L — ABNORMAL HIGH (ref 0–55)
ALT: 191 U/L — ABNORMAL HIGH (ref 0–55)
AST (SGOT): 57 U/L — ABNORMAL HIGH (ref 5–41)
AST (SGOT): 55 U/L — ABNORMAL HIGH (ref 5–41)
Albumin/Globulin Ratio: 1 (ref 0.9–2.2)
Albumin/Globulin Ratio: 1 (ref 0.9–2.2)
Albumin: 3 g/dL — ABNORMAL LOW (ref 3.5–5.0)
Albumin: 3.1 g/dL — ABNORMAL LOW (ref 3.5–5.0)
Alkaline Phosphatase: 79 U/L (ref 37–117)
Alkaline Phosphatase: 72 U/L (ref 37–117)
Anion Gap: 12 (ref 5.0–15.0)
Anion Gap: 12 (ref 5.0–15.0)
BUN: 42 mg/dL — ABNORMAL HIGH (ref 9.0–28.0)
BUN: 40 mg/dL — ABNORMAL HIGH (ref 9.0–28.0)
Bilirubin, Total: 1.5 mg/dL — ABNORMAL HIGH (ref 0.2–1.2)
Bilirubin, Total: 1.6 mg/dL — ABNORMAL HIGH (ref 0.2–1.2)
CO2: 24 mEq/L (ref 17–29)
CO2: 25 mEq/L (ref 17–29)
Calcium: 9 mg/dL (ref 7.9–10.2)
Calcium: 9.3 mg/dL (ref 7.9–10.2)
Chloride: 98 mEq/L — ABNORMAL LOW (ref 99–111)
Chloride: 97 mEq/L — ABNORMAL LOW (ref 99–111)
Creatinine: 2.7 mg/dL — ABNORMAL HIGH (ref 0.5–1.5)
Creatinine: 2.6 mg/dL — ABNORMAL HIGH (ref 0.5–1.5)
Globulin: 3 g/dL (ref 2.0–3.6)
Globulin: 3.1 g/dL (ref 2.0–3.6)
Glucose: 283 mg/dL — ABNORMAL HIGH (ref 70–100)
Glucose: 245 mg/dL — ABNORMAL HIGH (ref 70–100)
Potassium: 3.6 mEq/L (ref 3.5–5.3)
Potassium: 3.8 mEq/L (ref 3.5–5.3)
Protein, Total: 6 g/dL (ref 6.0–8.3)
Protein, Total: 6.2 g/dL (ref 6.0–8.3)
Sodium: 134 mEq/L — ABNORMAL LOW (ref 135–145)
Sodium: 134 mEq/L — ABNORMAL LOW (ref 135–145)
eGFR: 23.7 mL/min/{1.73_m2} — AB (ref >=60)
eGFR: 24.8 mL/min/{1.73_m2} — AB (ref >=60)

## 2022-12-25 LAB — LACTIC ACID: Lactic Acid: 1.3 mmol/L (ref 0.2–2.0)

## 2022-12-25 LAB — WHOLE BLOOD GLUCOSE POCT
Whole Blood Glucose POCT: 219 mg/dL — ABNORMAL HIGH (ref 70–100)
Whole Blood Glucose POCT: 227 mg/dL — ABNORMAL HIGH (ref 70–100)
Whole Blood Glucose POCT: 239 mg/dL — ABNORMAL HIGH (ref 70–100)
Whole Blood Glucose POCT: 227 mg/dL — ABNORMAL HIGH (ref 70–100)

## 2022-12-25 LAB — PHOSPHORUS: Phosphorus: 2.9 mg/dL (ref 2.3–4.7)

## 2022-12-25 LAB — COOXIMETRY PROFILE
Carboxyhemoglobin: 1.6 % (ref 0.0–3.0)
Hematocrit Total Calculated: 30.4 % — ABNORMAL LOW (ref 40.0–54.0)
Hemoglobin Total: 9.8 g/dL — ABNORMAL LOW (ref 13.0–17.0)
Methemoglobin: 1 % (ref 0.0–3.0)
O2 Content: 8.7 Vol %
Oxygenated Hemoglobin: 63.2 % — ABNORMAL LOW (ref 85.0–98.0)

## 2022-12-25 LAB — TYPE AND SCREEN
AB Screen Gel: NEGATIVE
ABO Rh: O POS

## 2022-12-25 LAB — ANTI-XA,UFH
Anti-Xa, UFH: 0.51 IU/mL
Anti-Xa, UFH: 0.52 IU/mL

## 2022-12-25 SURGERY — TUNNELED CATH PLACEMENT
Anesthesia: Anesthesia Choice

## 2022-12-25 MED ORDER — FENTANYL CITRATE (PF) 50 MCG/ML IJ SOLN (WRAP)
INTRAMUSCULAR | Status: AC
Start: 2022-12-25 — End: ?
  Filled 2022-12-25: qty 2

## 2022-12-25 MED ORDER — FENTANYL CITRATE (PF) 50 MCG/ML IJ SOLN (WRAP)
INTRAMUSCULAR | Status: AC | PRN
Start: 2022-12-25 — End: 2022-12-25
  Administered 2022-12-25: 25 ug via INTRAVENOUS

## 2022-12-25 MED ORDER — LIDOCAINE HCL 1 % IJ SOLN
INTRAMUSCULAR | Status: AC
Start: 2022-12-25 — End: ?
  Filled 2022-12-25: qty 10

## 2022-12-25 MED ORDER — ALBUMIN HUMAN/BIOSIMILIAR 5% IV SOLN (WRAP)
INTRAVENOUS | Status: AC
Start: 2022-12-25 — End: 2022-12-25
  Filled 2022-12-25: qty 250

## 2022-12-25 MED ORDER — HYDRALAZINE HCL 25 MG PO TABS
25.0000 mg | ORAL_TABLET | Freq: Once | ORAL | Status: AC
Start: 2022-12-25 — End: 2022-12-25
  Administered 2022-12-25: 25 mg via ORAL
  Filled 2022-12-25: qty 1

## 2022-12-25 MED ORDER — LIDOCAINE-EPINEPHRINE 1 %-1:100000 IJ SOLN
INTRAMUSCULAR | Status: AC
Start: 2022-12-25 — End: ?
  Filled 2022-12-25: qty 20

## 2022-12-25 MED ORDER — FUROSEMIDE 10 MG/ML IJ SOLN
200.0000 mg | Freq: Once | INTRAMUSCULAR | Status: AC
Start: 2022-12-25 — End: 2022-12-25
  Administered 2022-12-25: 200 mg via INTRAVENOUS
  Filled 2022-12-25: qty 20

## 2022-12-25 MED ORDER — LIDOCAINE-EPINEPHRINE 1 %-1:100000 IJ SOLN
INTRAMUSCULAR | Status: AC | PRN
Start: 2022-12-25 — End: 2022-12-25
  Administered 2022-12-25: 5 mL

## 2022-12-25 MED ORDER — POTASSIUM CHLORIDE CRYS ER 20 MEQ PO TBCR
40.0000 meq | EXTENDED_RELEASE_TABLET | Freq: Once | ORAL | Status: AC
Start: 2022-12-25 — End: 2022-12-25
  Administered 2022-12-25: 40 meq via ORAL
  Filled 2022-12-25: qty 2

## 2022-12-25 MED ORDER — INSULIN GLARGINE 100 UNIT/ML SC SOLN
10.0000 [IU] | Freq: Two times a day (BID) | SUBCUTANEOUS | Status: DC
Start: 2022-12-25 — End: 2022-12-26
  Filled 2022-12-25: qty 10

## 2022-12-25 MED ORDER — LIDOCAINE HCL 1 % IJ SOLN
INTRAMUSCULAR | Status: AC | PRN
Start: 2022-12-25 — End: 2022-12-25
  Administered 2022-12-25: 10 mL

## 2022-12-25 MED ORDER — HYDRALAZINE HCL 50 MG PO TABS
75.0000 mg | ORAL_TABLET | Freq: Three times a day (TID) | ORAL | Status: DC
Start: 2022-12-25 — End: 2022-12-26
  Administered 2022-12-25 – 2022-12-26 (×3): 75 mg via ORAL
  Filled 2022-12-25: qty 1
  Filled 2022-12-25: qty 3
  Filled 2022-12-25: qty 1

## 2022-12-25 MED ORDER — INSULIN GLARGINE 100 UNIT/ML SC SOLN
20.0000 [IU] | Freq: Two times a day (BID) | SUBCUTANEOUS | Status: DC
Start: 2022-12-25 — End: 2022-12-26
  Administered 2022-12-25 – 2022-12-26 (×2): 20 [IU] via SUBCUTANEOUS
  Filled 2022-12-25: qty 20

## 2022-12-25 SURGICAL SUPPLY — 2 items
KIT CATHETER L60 CM OD6 FR PRO-LINE 1 (Catheter) ×1 IMPLANT
KIT CATHETER L60 CM OD6 FR PRO-LINE 1 LUMEN BASIC (Catheter) ×1 IMPLANT

## 2022-12-25 NOTE — Sedation Documentation (Signed)
Pt had hohn catheter placed by Dr. Dennis Bast. VSS. No complaints of pain. Site is intact. Bedside report given to [facility] ([facility]), they stated no questions. Pt transported back to floor on monitor by floor RN and transporter.

## 2022-12-25 NOTE — Progress Notes (Signed)
Unable to complete VAD re-education today due to patient being in IR and receiving sedation immediately prior to scheduled time. Spoke with patient's spouse and rescheduled VAD education for Tuesday, 12/29/2022 at [place]. Left educational pamphlets with spouse to review prior to education session.    Clarisse Gouge, BSN, RN    [employer] [facility]  [place] Pager [phone] ID [phone]

## 2022-12-25 NOTE — Plan of Care (Signed)
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 Attempted to see patient today and patient was not amenable to interview today, but is amenable to interview on 3/11. Reached out to primary team and spoke with Dr. Jefferey Pica and RN Waymon Budge who noted there were no acute safety concerns and the patient could be seen on 3/11.    --Will reattempt consult on 3/11    Signed:    Eda Paschal, DO  Psychiatric Consult Liaison Resident

## 2022-12-25 NOTE — Progress Notes (Signed)
[facility].  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  -CAD with past PCI   -Mild hyponatremia due to hypervolemia   -Non gap acidosis due to CKD                Plan:     -No need for RRT at this time   -Continue with Lasix drip at 20 mg/hr    UOP 4.7L, negative 2.7L, CVP 18  -supportive care   -please try to avoid contrast studies and nephrotoxins   -daily labs   -Please avoid PICC lines and or midlines    Can get a Hohn catheter       [name], MD  Epic Secure Chat: direct or group "[facility]  [place] [phone]  Office - [phone]  ++++++++++++++++++++++++++++++++++++++++++++++++++++++++++++++  Chief Complaint:    Interval History:    Medications:  Scheduled Meds:  Current Facility-Administered Medications   Medication Dose Route Frequency    albumin human        amiodarone  400 mg Oral TID    aspirin EC  81 mg Oral Daily    atorvastatin  40 mg Oral QHS    hydrALAZINE  75 mg Oral Q8H [place]    insulin glargine  20 Units Subcutaneous Q12H    Or    insulin glargine  10 Units Subcutaneous Q12H    insulin lispro  1-6 Units Subcutaneous QHS    insulin lispro  2-10 Units Subcutaneous TID AC    iron sucrose  200 mg Intravenous Q24H [place]    isosorbide dinitrate  20 mg Oral TID - Nitrate Free Interval    niacin  500 mg Oral BID    polyethylene glycol  17 g Oral Daily    senna-docusate  1 tablet Oral Q12H [place]     Continuous Infusions:   DOBUTamine 5 mcg/kg/min (12/25/22 0900)    furosemide (LASIX) infusion 2 mg/mL 20 mg/hr (12/25/22 0935)    heparin infusion 25,000 units/500  mL (VTE/Moderate Intensity) 18 Units/kg/hr (12/25/22 0900)     PRN Meds:acetaminophen, albumin human, dextrose **OR** dextrose **OR** dextrose **OR** glucagon (rDNA), phenol    Objective:  Vital signs in last 24 hours:  Heart Rate:  [73-88] 80  Resp Rate:  [13-36] 21  BP: (102-127)/(57-73) 114/61  Intake/Output last 24 hours:    Intake/Output Summary (Last 24 hours) at 12/25/2022 1001  Last data filed at 12/25/2022 0935  Gross per 24 hour   Intake 1656.67 ml   Output 5075 ml   Net -3418.33 ml     Intake/Output this shift:  I/O this shift:  In: 115 [I.V.:115]  Out:  600 [Urine:600]    Physical Exam:   Gen: Well developed, no acute distress   CV: S1 S2 N RRR, no edema   Chest: Good effort, CTAB    Labs:  Recent Labs   Lab 12/25/22  0328 12/24/22  1513 12/24/22  0912 12/24/22  0301 12/23/22  2109 12/23/22  1602 12/22/22  0424 12/21/22  2247 12/21/22  1837   Glucose 283* 340*  --  276* 297* 266*  More results in Results Review 199* 167*   BUN 42.0* 44.0*  --  46.0* 49.0* 50.0*  More results in Results Review 61.0* 60.0*   Creatinine 2.7* 2.7*  --  2.6* 2.8* 2.8*  More results in Results Review 4.2* 4.1*   Calcium 9.0 8.7  --  8.5 8.3 8.3  More results in Results Review 8.7 8.9   Sodium 134* 135  --  132* 133* 133*  More results in Results Review 135 135   Potassium 3.6 4.2  --  4.1 3.9 4.1  More results in Results Review 4.9 4.8   Chloride 98* 100  --  99 99 100  More results in Results Review 101 103   CO2 24 22  --  '21 22 24  '$ More results in Results Review 17 19   Albumin 3.0* 3.0* 3.0* 3.0* 2.9* 2.9*  More results in Results Review 3.1*  --    Phosphorus 2.9  --   --   --   --   --   --  7.2* 7.4*   Magnesium  --   --   --  2.2 2.2 2.3  More results in Results Review 2.9* 2.8*   More results in Results Review = values in this interval not displayed.     Recent Labs   Lab 12/25/22  0328 12/24/22  2113 12/24/22  0912   WBC 5.75 5.37 5.49   Hgb 10.2* 11.1* 10.8*   Hematocrit 31.3* 33.7* 33.2*   MCV 101.0* 102.1* 101.2*   MCH  32.9 33.6* 32.9   MCHC 32.6 32.9 32.5   RDW 16* 16* 16*   MPV 12.6* 12.9* 12.1   Platelets 71* 76* 64*

## 2022-12-25 NOTE — Nursing Progress Note (Addendum)
Bleeding from RIJ venous sheath insertion site noted at 1655 (line removed in IR at 1500). Manual pressure applied until 1730. Quick clot and compression dressing applied. Rebleeding and reapplication of dressing at 1800. Pt in reverse trendelenburg     MD at bedside - no suturing intervention indicated. CBC ordered & heparin gtt paused. Hgb delta 0.3

## 2022-12-25 NOTE — Plan of Care (Signed)
Events - -      Neuro: aox4, moves all extremities, follow commands, denies pain  Cardiac:  v-paced w/ occasional PVCs on monitor, PAC in place, MAPs above 65 overnight, dampened PAP waveform - APP at bedside towards end of shift to troubleshoot and waveform now appears appropriate  Respiratory: on room air with sats in high 90s  GI/GU: external catheter placed for frequency in urination, on lasix gtt, '200mg'$  lasix IVP x1 overnight, x1 bowel movement overnight  Endo: BG in 300s at bedtime check - given lispro coverage and lantus  Skin: scattered bruising and scars, patient able to turn self/shift weight in bed, heels elevated  Fall Score: high     Lines/Drains:  - RIJ PAC w introducer  - multiple PIVs  - external catheter     Comments:   - see flowsheets for assessments  - APP notified regarding difficulty obtaining thermo numbers and APP came to the bedside to assess CVP line, and this RN instructed to go off FICK calculations  - APP notified of dampened PAP waveform, sluggish blood return from PA lumen, and resistance to hard flushing  - APP notified of patient's VT runs with otherwise stable vital signs and no new symptoms     Plan:   - continue patient on dobutamine, heparin, and lasix gtt  - monitor I/Os    Problem: Moderate/High Fall Risk Score >5  Goal: Patient will remain free of falls  Outcome: Progressing  Flowsheets (Taken 12/25/2022 0106)  High (Greater than 13):   HIGH-Bed alarm on at all times while patient in bed   HIGH-Visual cue at entrance to patient's room   HIGH-Apply yellow "Fall Risk" arm band     Problem: Hemodynamic Status: Cardiac  Goal: Stable vital signs and fluid balance  Outcome: Progressing  Flowsheets (Taken 12/25/2022 0106)  Stable vital signs and fluid balance:   Assess signs and symptoms associated with cardiac rhythm changes   Monitor lab values     Problem: Inadequate Gas Exchange  Goal: Adequate oxygenation and improved ventilation  Outcome: Progressing  Flowsheets (Taken 12/25/2022  0106)  Adequate oxygenation and improved ventilation:   Assess lung sounds   Monitor SpO2 and treat as needed   Plan activities to conserve energy: plan rest periods   Increase activity as tolerated/progressive mobility

## 2022-12-25 NOTE — OT Progress Note (Signed)
Occupational Therapy Treatment  [name] [name]        Post Acute Care Therapy Recommendations:     Discharge Recommendations:  Home with supervision  D/C Milestones: increased mobility and ADLs Anticipate achievement in 1-2 sessions    DME needs IF patient is discharging home: Shower chair    Therapy discharge recommendations may change with patient status.  Please refer to most recent note for up-to-date recommendations.    Assessment:   Significant Findings: none    Patient agreeable to OT and tolerated session well.  Pt received in chair and is progressing towards OT goals.  Pt required Min A to don/doff socks and SBA for sit<>stand.  SBA with no AD during household mobility and when washing hands at sink.  Pt with increased WOB during in-room amb, educated on ECT and PLB with pt verbalizing understanding and demonstrating good carryover, VSS t/o session.  Positioned for comfort in chair and needs met at end of session.  Continued OT services to address ADL and mobility goals would benefit pt.      Treatment Activities: ADLs, functional mobility/transfers, pt training     Educated the patient to role of occupational therapy, plan of care, goals of therapy and safety with mobility and ADLs, energy conservation techniques, pursed lip breathing.    Plan:   OT Frequency Recommended: 3-4x/wk     Goal Formulation: Patient  OT Plan  Risks/Benefits/POC Discussed with Pt/Family: With patient  Treatment Interventions: ADL retraining, Functional transfer training, UE strengthening/ROM, Endurance training, Patient/Family training, Equipment eval/education  Discharge Recommendation: Home with supervision  DME Recommended for Discharge: Shower chair  OT Frequency Recommended: 3-4x/wk  OT - Next Visit Recommendation: 12/28/22    Continue plan of care.      Unit: [facility]  Bed: FI102/FI102-01       Precautions and Contraindications:   Precautions  Other Precautions: falls risk    Updated  Medical Status/Imaging/Labs:  X-ray chest AP portable    Result Date: 12/25/2022  No change mild edema [name] Bernheim, MD 12/25/2022 8:36 AM      Subjective: "I feel fine."   Patient's medical condition is appropriate for Occupational Therapy intervention at this time.  Patient is agreeable to participation in the therapy session. Nursing clears patient for therapy.    Pain Assessment  Pain Assessment: No/denies pain    Objective:   Observation of Patient/Vital Signs:  Patient is seated in a bedside chair with telemetry, ICU lines/leads, radial A-line, external catheter in place.  Pt wore mask during therapy session:No      Cognition/Neuro Status  Arousal/Alertness: Appropriate responses to stimuli  Attention Span: Appears intact  Orientation Level: Oriented X4  Memory: Appears intact  Following Commands: Follows all commands and directions without difficulty  Safety Awareness: independent  Insights: Fully aware of deficits  Problem Solving: Able to problem solve independently  Behavior: attentive;calm;cooperative    Functional Mobility  Sit to Stand Transfers: Stand by Assist  Stand to Sit Transfers: Stand by Assist  Functional Mobility/Ambulation: Stand by Assist (no AD)    Self-care and Home Management  Grooming: Stand by Assist;standing at sink;wash/dry hands  LB Dressing: Minimal Assist;sitting;Don/doff R sock;Don/doff L sock    Balance  Static Sitting: SBA  Dynamic Sitting: CGA  Static Standing: SBA for sit<>stand  Dynamic Standing: SBA no AD     PMP Activity: Step 6 - Walks in Room  Participation: good  Endurance: good-      Patient left with call bell within reach, all needs met, SCDs off as found, fall mat in place, chair alarm in place and all questions answered. RN notified of session outcome and patient response.       Goals:    Time For Goal Achievement: 5 visits  ADL Goals  Patient will groom self: Supervision, 5 visits  Patient will dress upper body: Supervision, 5 visits  Patient will dress  lower body: Supervision, 5 visits  Patient will toilet: Supervision, 5 visits  Mobility and Transfer Goals  Pt will perform functional transfers: Supervision, 5 visits                           PPE worn during session: procedural mask and gloves    Tech present: n/a  PPE worn by tech: N/A      [name], OTR/L  Pager [phone]     Time of Treatment:   OT Received On: 12/25/22  Start Time: [id number]  Stop Time: 1200  Time Calculation (min): 25 min    Treatment # 1 of 5 visits

## 2022-12-25 NOTE — Sedation Documentation (Signed)
Patient arrived to cath lab 5 from CICU. Threshold pause performed. Moved from bed to table via slide sheet with 2 person assist. Patient placed on monitor and oxygen for moderate sedation. VSS. Padded armboards inplace. Bair hugger turned on. Body in proper alignment. Patient being prepped in sterile fashion by technologist Marlowe Kays.

## 2022-12-25 NOTE — Progress Notes (Signed)
HEMATOLOGY ONCOLOGY PROGRESS NOTE  [facility] - [place]   [phone]    Date: 12/25/22    [name] [name] is a 77 y.o. male who follows with Dr. 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 Currently on low-dose dobutamine and intermittent diuresis. Pending LVAD evaluation. Management per cardiology and nephrology.    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 Currently holding in the setting of thrombocytopenia with PLT ~70K from normal baseline. Unclear etiology, nutritional studies and hemolysis labs normal, HIT Ab negative although SRA in process. Suspect likely due to acute illness with cardiogenic shock and AKI necessitating inotropic support as above, less likely immune thrombocytopenia in the setting of CLL although unlikely given overall good control of disease. He platelet count is stable and safe >50K in the absence of bleeding at this time, however should he necessitate a procedure requiring higher PLT goal would consider trial of steroids for possible ITP.    Thank you for allowing [place] to participate in the care of this patient.     Vincent Gros, MD  Hematology/Oncology  [facility]  [phone]    Subjective:   No acute events. Feels well with no complaints. Denies HA, F/C, cough, SOB, CP, N/V/D/C, abnormal bleeding.    Physical Exam     Vitals:    12/25/22 1445   BP: 122/70   Pulse: 84   Resp: (!) 26   Temp:    SpO2: 100%     Chronically ill appearing, no distress  AAOx3, MMM, EOMI  R neck CVL in place  Breathing comfortably on room air    Medications     Current Facility-Administered Medications   Medication Dose Route Frequency    albumin human        amiodarone  400 mg Oral TID    aspirin EC  81 mg Oral Daily     atorvastatin  40 mg Oral QHS    hydrALAZINE  75 mg Oral Q8H [place]    insulin glargine  20 Units Subcutaneous Q12H    Or    insulin glargine  10 Units Subcutaneous Q12H    insulin lispro  1-6 Units Subcutaneous QHS    insulin lispro  2-10 Units Subcutaneous TID AC    iron sucrose  200 mg Intravenous Q24H [place]    isosorbide dinitrate  20 mg Oral TID - Nitrate Free Interval    niacin  500 mg Oral BID    polyethylene glycol  17 g Oral Daily    senna-docusate  1 tablet Oral Q12H [facility]         Labs:     Results       Procedure Component Value Units Date/Time    Glucose Whole Blood - POCT [id number]  (Abnormal) Collected: 12/25/22 1132     Updated: 12/25/22 1135     Whole Blood Glucose POCT 227 mg/dL     Glucose Whole Blood - POCT [id number]  (Abnormal) Collected: 12/25/22 0724     Updated: 12/25/22 0726     Whole Blood Glucose POCT 219 mg/dL     Phosphorus [id number] Collected: 12/25/22 0328    Specimen: Blood Updated: 12/25/22 0456     Phosphorus 2.9 mg/dL  Comprehensive metabolic panel [id number]  (Abnormal) Collected: 12/25/22 0328    Specimen: Blood Updated: 12/25/22 0456     Glucose 283 mg/dL      BUN 42.0 mg/dL      Creatinine 2.7 mg/dL      Sodium 134 mEq/L      Potassium 3.6 mEq/L      Chloride 98 mEq/L      CO2 24 mEq/L      Calcium 9.0 mg/dL      Protein, Total 6.0 g/dL      Albumin 3.0 g/dL      AST (SGOT) 57 U/L      ALT 207 U/L      Alkaline Phosphatase 79 U/L      Bilirubin, Total 1.5 mg/dL      Globulin 3.0 g/dL      Albumin/Globulin Ratio 1.0     Anion Gap 12.0     eGFR 23.7 mL/min/1.73 m2     Anti-Xa, UFH [id number] Collected: 12/25/22 0328     Updated: 12/25/22 0452     Anti-Xa, UFH 0.51 IU/mL     Narrative:      Every 8 hours after initiation and every rate change. After  two subsequent values within goal range, change frequency to  IHS daily at 0400 until heparin is discontinued.  If heparin infusion is on MAR hold do not draw this lab.    CBC and differential [id number]  (Abnormal) Collected:  12/25/22 0328    Specimen: Blood Updated: 12/25/22 0437     WBC 5.75 x10 3/uL      Hgb 10.2 g/dL      Hematocrit 31.3 %      Platelets 71 x10 3/uL      RBC 3.10 x10 6/uL      MCV 101.0 fL      MCH 32.9 pg      MCHC 32.6 g/dL      RDW 16 %      MPV 12.6 fL      Instrument Absolute Neutrophil Count 3.99 x10 3/uL      Neutrophils 69.4 %      Lymphocytes Automated 24.9 %      Monocytes 3.1 %      Eosinophils Automated 1.7 %      Basophils Automated 0.0 %      Immature Granulocytes 0.9 %      Nucleated RBC 1.0 /100 WBC      Neutrophils Absolute 3.99 x10 3/uL      Lymphocytes Absolute Automated 1.43 x10 3/uL      Monocytes Absolute Automated 0.18 x10 3/uL      Eosinophils Absolute Automated 0.10 x10 3/uL      Basophils Absolute Automated 0.00 x10 3/uL      Immature Granulocytes Absolute 0.05 x10 3/uL      Absolute NRBC 0.06 x10 3/uL     Lactic Acid [id number] Collected: 12/25/22 0328    Specimen: Blood Updated: 12/25/22 0410     Lactic Acid 1.3 mmol/L     Narrative:      To be drawn with each measure of co-oximetry.    Co-Oximetry Profile [id number]  (Abnormal) Collected: 12/25/22 0328     Updated: 12/25/22 0409     Hemoglobin Total 9.8 g/dL      Carboxyhemoglobin 1.6 %      Oxygenated Hemoglobin 63.2 %      Methemoglobin 1.0 %      O2 Content 8.7 Vol %  Hematocrit Total Calculated 30.4 %     Narrative:      To be drawn with each measure of co-oximetry.    Culture Blood Aerobic and Anaerobic [id number] Collected: 12/22/22 2239    Specimen: Blood, Venipuncture Updated: 12/25/22 [id number]    Narrative:      The order will result in two separate 8-73m bottles  Please do NOT order repeat blood cultures if one has been  drawn within the last 48 hours  UNLESS concerned for  endocarditis  AVOID BLOOD CULTURE DRAWS FROM CENTRAL LINE IF POSSIBLE  Indications:->Pneumonia  ORDER#: [id number]                                   ORDERED BY: [employer]ear Clock SOURCE: Blood, Venipuncture                          COLLECTED:  12/22/22  22:39  ANTIBIOTICS AT COLL.:                                RECEIVED :  12/23/22 01:59  Culture Blood Aerobic and Anaerobic        PRELIM      12/25/22 02:27  12/24/22   No Growth after 1 day/s of incubation.  12/25/22   No Growth after 2 day/s of incubation.      Culture Blood Aerobic and Anaerobic [[id number]: 12/22/22 2239    Specimen: Blood, Venipuncture Updated: 12/25/22 [id number]   Narrative:      The order will result in two separate 8-132mbottles  Please do NOT order repeat blood cultures if one has been  drawn within the last 48 hours  UNLESS concerned for  endocarditis  AVOID BLOOD CULTURE DRAWS FROM CENTRAL LINE IF POSSIBLE  Indications:->Pneumonia  ORDER#: [id number]                                  ORDERED BY: GAVear ClockSOURCE: Blood, Venipuncture                          COLLECTED:  12/22/22 22:39  ANTIBIOTICS AT COLL.:                                RECEIVED :  12/23/22 02:00  Culture Blood Aerobic and Anaerobic        PRELIM      12/25/22 02:27  12/24/22   No Growth after 1 day/s of incubation.  12/25/22   No Growth after 2 day/s of incubation.      Folate [[id number]: 12/24/22 1513    Specimen: Blood Updated: 12/24/22 2345     Folate 12.6 ng/mL     Vitamin B12 [[id number](Abnormal) Collected: 12/24/22 1513    Specimen: Blood Updated: 12/24/22 2338     Vitamin B-12 >2,000 pg/mL     Hemolysis index [[id number](Abnormal) Collected: 12/24/22 1513     Updated: 12/24/22 2312     Hemolysis Index 61 Index     CBC WITH MANUAL DIFFERENTIAL [[id number](Abnormal) Collected: 12/24/22 2113     Updated: 12/24/22 2234     WBC  5.37 x10 3/uL      Hgb 11.1 g/dL      Hematocrit 33.7 %      Platelets 76 x10 3/uL      RBC 3.30 x10 6/uL      MCV 102.1 fL      MCH 33.6 pg      MCHC 32.9 g/dL      RDW 16 %      MPV 12.9 fL      Nucleated RBC 0.7 /100 WBC      Absolute NRBC 0.04 x10 3/uL      Segmented Neutrophils 77 %      Band Neutrophils 3 %      Lymphocytes Manual 18 %      Monocytes Manual  1 %      Eosinophils Manual 1 %      Basophils Manual 0 %      Neutrophils Absolute Manual 4.13 x10 3/uL      Band Neutrophils Absolute 0.16 x10 3/uL      Lymphocytes Absolute Manual 0.97 x10 3/uL      Monocytes Absolute 0.05 x10 3/uL      Eosinophils Absolute Manual 0.05 x10 3/uL      Basophils Absolute Manual 0.00 x10 3/uL      Cell Morphology Abnormal     Platelet Estimate Decreased     Polychromasia Present     Giant Platelets Present    Serotonin Release Assay (SRA), UFH [id number] Collected: 12/24/22 2113     Updated: 12/24/22 2153    Narrative:      FREEZE SERUM    Lactic Acid [id number] Collected: 12/24/22 2113    Specimen: Blood Updated: 12/24/22 2135     Lactic Acid 1.4 mmol/L     Narrative:      To be drawn with each measure of co-oximetry.    Co-Oximetry Profile [id number]  (Abnormal) Collected: 12/24/22 2113     Updated: 12/24/22 2134     Hemoglobin Total 10.6 g/dL      Carboxyhemoglobin 1.8 %      Oxygenated Hemoglobin 63.7 %      Methemoglobin 1.0 %      O2 Content 9.5 Vol %      Hematocrit Total Calculated 32.8 %     Narrative:      To be drawn with each measure of co-oximetry.    Glucose Whole Blood - POCT [id number]  (Abnormal) Collected: 12/24/22 2131     Updated: 12/24/22 2134     Whole Blood Glucose POCT 338 mg/dL     Comprehensive metabolic panel [id number]  (Abnormal) Collected: 12/24/22 1513    Specimen: Blood Updated: 12/24/22 1602     Glucose 340 mg/dL      BUN 44.0 mg/dL      Creatinine 2.7 mg/dL      Sodium 135 mEq/L      Potassium 4.2 mEq/L      Chloride 100 mEq/L      CO2 22 mEq/L      Calcium 8.7 mg/dL      Protein, Total 6.2 g/dL      Albumin 3.0 g/dL      AST (SGOT) 74 U/L      ALT 234 U/L      Alkaline Phosphatase 94 U/L      Bilirubin, Total 1.2 mg/dL      Globulin 3.2 g/dL      Albumin/Globulin Ratio 0.9     Anion Gap 13.0  eGFR 23.7 mL/min/1.73 m2     Lactic Acid [id number]  (Abnormal) Collected: 12/24/22 1513    Specimen: Blood Updated: 12/24/22 1530     Lactic Acid 2.5  mmol/L     Narrative:      To be drawn with each measure of co-oximetry.    Co-Oximetry Profile [id number]  (Abnormal) Collected: 12/24/22 1513     Updated: 12/24/22 1529     Hemoglobin Total 10.5 g/dL      Carboxyhemoglobin 1.7 %      Oxygenated Hemoglobin 58.7 %      Methemoglobin 0.5 %      O2 Content 8.7 Vol %      Hematocrit Total Calculated 32.5 %     Narrative:      To be drawn with each measure of co-oximetry.    Glucose Whole Blood - POCT [id number]  (Abnormal) Collected: 12/24/22 1520     Updated: 12/24/22 1522     Whole Blood Glucose POCT 311 mg/dL               Imaging   X-ray chest AP portable    Result Date: 12/25/2022  No change mild edema [name], MD 12/25/2022 8:36 AM    X-ray chest AP portable    Result Date: 12/24/2022  Stable exam. No new acute intrathoracic process. Montine Circle, MD 12/24/2022 11:44 AM    X-ray chest AP portable    Result Date: 12/23/2022  Improved inspiratory effort with mild dependent atelectasis [name], MD 12/23/2022 8:16 AM    XR Chest AP Portable    Result Date: 12/23/2022   Airspace disease in the medial right lower lobe. Blanchard Mane 12/23/2022 12:46 AM    XR Chest AP Portable    Result Date: 12/22/2022  1. Right Swan-Ganz catheter tip in the proximal right lower pulmonary artery. No pneumothorax. 2. Mild vascular congestion, small right pleural effusion and right basilar atelectasis. Ardelia Mems, MD 12/22/2022 4:59 PM    Chest AP Portable    Result Date: 12/20/2022  No acute intrathoracic process. Montine Circle, MD 12/20/2022 9:12 PM

## 2022-12-25 NOTE — Progress Notes (Signed)
Cardiac Intensive Care Unit (CICU)  History and Physical        Patient Name: [name] [name]  MRN: [id number]  Room: FI102/FI102-01    Chief Complaint    Cardiogenic shock SCAI stage C/D    History of Present Illness       3/4: RHC placed.   3/5: Diuresis, good UOP.   3/6: Started vanc and zosyn. Blood culture positive, repeat sent. RML possible infiltrate on CXR. CI: 1.7 overnight, lactate cleared. Wife to bring in CLL medication.   3/7: Improving CI overnight. Occasional 8 beat runs once or twice per shift. Antibiotics discontinued.   3/8: Lasix 120 at 9pm last night. Reports of Swan issues, some higher PAP due to line issues.  Cardiac index stabile, lactic acid clear      Assessment & Plan   Assessment and Plan   Summary: [name] [name] a 77 y.o. male with PMHx NICM/ICM s/p Medtronik ICD (placed 2013, replaced 2018), CAD s/p PCI with DES 2011, T2DM, COPD, stage IV CLL, CKD, RLE DVT on Eliquis presented to [facility] ED in SVT s/p unsuccessful cardioversion with return to NSR s/p adenosine. Since, started on dobutamine and transferred to [facility] for LVAD workup/evaluation, now status post right heart cath on 3/4, diuresing and optimizing cardiac index, aspiration event on 3/5, cleared by SLP, discontinued antibiotics, now planning to discontinue pulmonary artery cath given that cardiac index is stable on current regimen, patient will need peripheral line placement (by IR) discontinuation of pulmonary artery catheter prior to stepdown.     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 '200mg'$   Following  filling pressures, sodium, physical exam to guide diuresis  Afterload: Increase hydralazine 75 mg Q8H, and isordil 20 mg TID, holding BB  Inotropy: dobutamine 5 mcg/kg/min  Device: Medtronik ICD  Latest TTE: 3/4 with EF 11%, severe global hypokinesis, decent RV function   Advanced therapies: was undergoing evaluation for LVAD, delayed by renal function worsening  Continue ASA 81 mg QD and Lipitor 40 mg QHS  heparin infusion, VTE protocol   Blood Pressure Goal: MAP > 65    From cardiac function standpoint, patient is stable for stepdown from CIC unit. Prior to this, patient will need pulmonary artery catheter removed, alternate home line insertion for dobutamine infusion.  Contact IR for tunneled catheter placement, nephrology does not want PICC line placement, to preserve site for possible fistula in the event patient needs hemodialysis down the road given that he is CKD stage IV.  Oblige.  CVP: 21  PAP 62/28,  SVR:984  CI: 2.43 (Fick)   SVO2:  0.56  Lactic acid: 1.3    PULMONARY:   Hx of COPD    Last vent settings if on vent:     Ventilator Time (if on vent):     Aspiration event 3/5 with evening meal, cleared by SLP  Off supplemental oxygen as needed to keep sat > 90%  Diuresis as above  incentive spirometer, rib mobilization techniques       RENAL:   AKI on CKD, approaching baseline, resolving      Intake/Output Summary (Last 24 hours) at 12/25/2022 0658  Last data filed at 12/25/2022 0600  Gross per 24 hour   Intake 2448.4 ml   Output 4825 ml   Net -2376.6 ml      Follows with Nephrology  [facility], consulted and appreciate recommendations  baseline serum creatinine about 2.5 mg/dL, initial Serum creatinine at 4 mg/dL initially, now down to 2.7 indicating improving organ perfusion  Follow BMP and mag q6hr while diuresing   Avoid nephrotoxin  Promote bedside urinal     GASTROINTESTINAL:  Transaminitis in setting of cardioversion    Last Bowel Movement:   Last BM Date: 12/25/22 LFT's daily, improving  Nutrition: Regular diet  Bowel Regimen: senna/docusate, patient having bowel movements   INFECTIOUS DISEASE:   C/f aspiration  Positive BC grams stain  Leukocytosis, resolved    No data recorded.     * No values recorded between 12/22/2022 12:25 PM and 12/25/2022  6:58 AM *  Lab Results   Component Value Date    WBC 5.75 12/25/2022      CAUTI Prevention (n/a if blank):  Foley Day:         Blood Steam Infection Prevention (n/a if blank):   Pulmonary Artery Catheter 12/22/22 Internal jugular Right COVID/flu negative  Last culture dates: No growth to date on blood cultures from 3/5 , blood cultures 3/4, favoring contaminant  No leukocytosis, afebrile, but with new oxygen requirement, overall not giving infectious picture  vanc/Zosyn discontinued, will restart if indicated by follow-up cultures or development of infectious symptoms.    MRSA nares, negative                   HEME/ONC:   Hx of RLE DVT on Eliquis  Hx of CLL, on acalabrutinib  thrombocytopenia    heparin 25,000 units in dextrose 5% 500 mL infusion (premix)  heparin gtt  HIT assay,negative, Thrombocytopenia possibly 2/2 shock, stress, production decrease, less likely destruction, will order smear, consider progression of CLL. Pan cell line decrease. Consider ITP, ADE of zosyn/vanc. Less favoring hemodilution.   Following by heme-onc, recommended holding Calquence, appreciate recommendations  Iron profile, C/w ID  IV iron 200 over 5 days clinically appropriate   ENDO/RHEUM:  T2DM  Sliding scale  HgbA1C within acceptable range given age  Blood  glucose in the mid 200s above acceptable  Goal glucose range 100 - 180  Glargine 15 units twice daily     Current Infusions (n/a if blank): dobutamine       Next of Kin Updated: Open ACP Navigator   Primary Emergency Contact: Berlinger,Octavia  Yes    Code Status: Full Code  DVT prophylaxis: Heparin GGT  Tubes lines and drains: Pulmonary artery catheter, peripheral IV  Diet: Regular diet  GI prophylaxis per Colace  Ulcer prophylaxis: None              Examination:  Vitals Heart Rate:  [75-88]   Resp Rate:  [17-36]   BP: (102-127)/(57-73)   SpO2:  [97 %-100 %]   Height:  [182.9 cm (6' 0.01")]  // Temperature with 24 range    Neuro:    Non-focal neuro exam alert & oriented x 3     Lungs:   clear to auscultation, no wheezes, rales or rhonchi, symmetric air entry     Cardiac:   normal rate, regular rhythm, normal S1, S2, no murmurs, rubs, clicks or gallops     Abdomen:    soft, nontender, nondistended, no masses or organomegaly     Extremities:   peripheral pulses normal, +1 pitting edema in extremities bilaterally.    Skin:     Warm, dry and intact Fick/Cardiac Hemodynamics   VO2 Determination  Height: 182.9 cm (6' 0.01")  Weight FICK (only): 93.1 kg (205 lb 4 oz)  BSA (Fick only) (Calculated - sq m): 2.17 sq meters  VO2: 271.25    CI (L/min/m2): 2.2 L/min/m2    CO/CI Determination  SaO2 : 0.97  SvO2 : 0.63  SaO2 - SvO2: 0.34  HGB Result: 11.1  x 1.36 x 10: 51.33  Cardiac Output (FICK): 5.28  Cardiac Index (FICK): 2.43  Cardiac Power Output: 0.94    PVR Determination  PAP: 78/73  PAP (Mean): 32 mmHg  Pulmonary Artery Pulsatility Index (PAPI): 0.33    SVR Determination  MAP (mmHg): 75  CVP (mmHg): 17 mmHg  MAP - CVP: 58  SVR (dynes/second/cm3): 984.85 dynes/second/cm3       CVP: 21  PAP 62/28,  SVR:984  CI: 2.43 (Fick)   SVO2:  0.56  Lactic acid: 1.3       AST: 57  ALT: 207  Most Recent Labs  5.75 \ 10.2* / 71*    / 31.3* \    03/08 0328    134* 98* 42.0* / 283*   3.6 24 2.7* \    03/08 0328                      Thelma Comp, DO  12/25/2022 6:58 AM     {

## 2022-12-25 NOTE — Progress Notes (Signed)
Patient seen and examined. Agree with assessment and plan as per ICU team note with the following additions/caveats:    Overnight Events: No significant overnight events      Fick/Cardiac Hemodynamics   VO2 Determination  Height: 182.9 cm (6' 0.01")  Weight FICK (only): 93.1 kg (205 lb 4 oz)  BSA (Fick only) (Calculated - sq m): 2.17 sq meters  VO2: 271.25    CO/CI Determination  SaO2 : 0.97  SvO2 : 0.63  SaO2 - SvO2: 0.34  HGB Result: 11.1  x 1.36 x 10: 51.33  Cardiac Output (FICK): 5.28  Cardiac Index (FICK): 2.43  Cardiac Power Output: 0.94    PVR Determination  PAP: 61/27  PAP (Mean): 32 mmHg  Pulmonary Artery Pulsatility Index (PAPI): 1.62    SVR Determination  MAP (mmHg): 81  CVP (mmHg): 21 mmHg  MAP - CVP: 60  SVR (dynes/second/cm3): 984.85 dynes/second/cm3          Assessment:  Cardiogenic shock, SCAI stage C  Acute on chronic systolic heart failure, EF <15%, ischemic cardiomyopathy, 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 to continue loading dose  Continue dobutamine, 5 mcg/kg/min, will plan for Hohn catheter today and D/C PA catheter  Renal function continues to improve, continue diuretics as needed. Baseline creatinine 2.5  Platelet count stable, HIT antibody negative.  Appreciate hematology consultation.  Continue to hold Calquence for CLL at this time  Nephrology consultation appreciated  Continue aspirin, atorvastatin, hydralazine, isosorbide. Increase hydralazine to 75 mg BID  PT/OT, activity as tolerated, nutrition consultation, SLP evaluation.   Delirium  precautions.  Optimize functional status as ongoing LVAD evaluation underway  Increase Lantus, 20 units SQ twice daily, sliding scale insulin, target glucose less than 180  Following Hohn catheter placement and PA catheter removal, OK from [facility] standpoint for transfer to CTU    Lines/tubes/foley:  Right IJ venous sheath, PA catheter 3/5 , may remove once Hohn catheter placed    CODE STATUS: FULL    DVT prophylaxis: Heparin infusion for history of DVT, right common femoral vein 5/23, eventual transition back to apixaban  GI prophylaxis: Not indicated     Nutrition started within 24 hours: Yes    Discussed in detail with bedside nurse, ICU team, CICU cardiologist Dr. Merrilyn Puma. Discussed with patient.  This patient has a high probability of sudden clinically significant deterioration which requires the highest level of physician preparedness to intervene urgently. I managed/supervised life or organ supporting interventions that required frequent physician assessments.   I devoted my full attention to the direct care of this patient for this period of time.      Organ systems which are failing and require intensive, critical care support are:   IV inotropic support     Any critical care time was performed today and is exclusive of teaching, billable procedures, and not overlapping with any other providers.     Critical care time:35 minutes

## 2022-12-25 NOTE — Transfer Summary (Signed)
CICU1 Transfer Summary Note     CICU admission dates:    12/21/2022 - 12/25/2022    CICU Intensivists:   Anders Simmonds, MD   Lucrezia Europe, MD     CICU Cardiologists:   Newman Nip MD     Reason/Summary of CICU stay:   Patient with a history of nonischemic and ischemic cardiomyopathy under evaluation for LVAD by transplant team outpatient.  He initially presented in supraventricular tachycardia, failed cardioversion electrically, return to normal sinus  rhythm following adenosine, and was subsequently admitted to the CICU.  Patient was found to have an ejection fraction of 11%, and severe global hypokinesis, previous ejection fraction 20% from November 2023.  During his time in the CICU patient was diuresed, afterload agents were increased to include hydralazine and Isordil.  Received titration of inotrope therapy to dobutamine.  Preload, inotropes, afterload were titrated tailoring to cardiac values obtained with pulmonary artery catheterization.  His endorgan function improved, to include renal approaching baseline, liver function improved.  Cardiac index stable and appropriate for stepdown, overall patient deemed stable to stepdown in order to have more aggressive reconditioning for improving LVAD device candidacy.  Prior to stepdown patient's pulmonary catheter was discontinued, and a peripheral line tunneled cath was placed by IR to facilitate inotrope infusion looking forward. Psychiatry consult pending, will see Monday has patient has been dealing with depression throughout the process.    Date of LHC & summary of findings:   N/a    Date of RHC & summary of findings:   (12/22/2022)  RA 15/15/11  RV 48/1/14  PA 48/21/28  PCWP 17/18/17  RA sat 58.9 (pulsox 99%  FickCO 4.2 L/min  FickCI 2.0 L/min/m2  ThermCO 2.8 L/min  ThermCI 1.3 L/min/m2  PVR 2.1 wu (Fick) 3.1 wu (Therm)    Last echo & findings:   (12/21/2022)   * Limited echo to evaluate biventricular function.    * The left ventricle is moderately dilated  with LVIDd 6.8 cm.    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    * Severe global hypokinesis with regional variation.    * The right ventricular cavity size is mildly dilated.    * Moderately decreased right ventricular systolic function.    * There is severe mitral regurgitation which is functional secondary to  dilated left ventricle.    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    * Compared to the prior study dated 09/08/22, the EF remains severely    Dates/details of other pertinent cardiac procedures including interventionalists:   Tunneled catheter placement performed 3/8 by Dr. Dennis Bast    Pertinent Cardiac Meds: (list medication, dose, reason, end time if applicable):    - ASA 81 mg QD, lipitor 40 mg QD  - Hydralazine 75 mg TID, isordil 20 mg TID  - Amiodarone 400 mg TID, to be on Amiodarone 200 mg QD starting 3/13  - Venover 200 mg IV x5 days  - Dobutamien 5 mcg/kg/min (Advanced Heart Failure will follow)  - Lasix gtt at 20 mg/hr (have been bolusing with Lasix 200 mg IV)  - Moderate intensity heparin gtt for RLL DVT    Signed out to hospitalist: Dr. Glenford Bayley     Signed out to [facility] cardiology group      [facility],  MD  PGY-3  Internal Medicine    ADDENDUM 12/25/22 2021:  Pt's heparin gtt off since 1715 due to concern for bleeding at hohn and r IJ site. Upon inspection no signs hematoma. No active bleeding. Keep heparin gtt off overnight and resume in am if no change.     Governor Specking, PA-C  [facility]

## 2022-12-25 NOTE — Nursing Progress Note (Signed)
Verbal report given to Roni Bread, Therapist, sports via telephone.  No further questions at this time.  Patient attached to montior and transferred to room 461 without issue.  Gtts were handed off,  please see MAR, R IJ site and Hohn assessed with Roni Bread, Therapist, sports.  Heparin gtt to be restarted in the AM.

## 2022-12-25 NOTE — H&P (Signed)
BRIEF IR HISTORY AND PHYSICAL    Date Time: 12/25/22 2:09 PM    INDICATIONS:   Procedure(s):  SL HOHN      PROCEDURALIST COMMENTS BELOW:   2M with need for tunneled CVC for home dobutamine, has CKD. Here for SL 61F hohn cath placement.    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    Coronary artery disease 2011    CORONARY STENT PLACEMENT FOLLOWED BY [facility]    Depression     echocardiogram 02/2016, 11/2016, 09/2017, 12/20/2017    GERD (gastroesophageal reflux disease)     Heart attack 2011    and 2014    Hyperlipemia     Hypertension     ICD (implantable 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     PLACED 2014  [facility]  LAST INTERROGATION April 01 2018 REPORT REQUESTED    Ischemic cardiomyopathy     EF 15% ON ECHO 09-20-2017 DR GARG IN EPIC    Nonischemic cardiomyopathy     NSTEMI (non-ST elevated myocardial infarction)     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    Pacemaker 2014    MEDTRONIC ICD/PACEMAKER COMBO LAST INTERROGATION  12-12 2018    Pneumonia 09/2016    Primary cardiomyopathy     Ischemic cardiomyopathy EF 15% ON ECHO 09-20-2017 DR GARG IN EPIC    Sepsis 08/2017    Syncope and collapse     PRIOR TO ICD/PACEMAKER PLACED 2014    Type 2 diabetes mellitus, controlled DX 1998    BS  AVG 100  A1C 7.4 Dec 27 2017    [place]     PT SEE HIS PMD April 01 2018 RE THIS-TOLD TO SEE PMD BY [facility] JUNE 12 WHEN THEY SAW HIM FOR A CL       PAST SURGICAL HISTORY     Past Surgical History:   Procedure Laterality Date    BIV  11/10/2016    ICD METRONIC  UPGRADE     CARDIAC CATHETERIZATION  03/2010    CORONARY  STENT PLACEMENT X 2 PER PT; LM normal, LAD 70-80% distal lesion at apex, 20% lesion mid CFX    CARDIAC CATHETERIZATION  12/2012    CARDIAC DEFIBRILLATOR PLACEMENT  12/2012    [facility]    CARDIAC PACEMAKER PLACEMENT  2014    MEDTRONIC ICD/PACEMAKER PLACED [facility]    CIRCUMCISION  AGE 77    COLONOSCOPY  2009    CORRECTION HAMMER TOE      duodenal ulcer  1973    STRESS RELATED IN SCHOOL    ECHOCARDIOGRAM, TRANSTHORACIC  02/2016 11/2016 09/2017 12/2017    ECHOCARDIOGRAM, TRANSTHORACIC  02/2016,11/2016,12/20/2017    EGD N/A 08/15/2014    Procedure: EGD;  Surgeon: Loraine Maple, MD;  Location: [facility];  Service: Gastroenterology;  Laterality: N/A;  egd w/ bx    EGD  1975    EGD, COLONOSCOPY  N/A 04/04/2018    Procedure: EGD with bxs, COLONOSCOPY with polypectomy and clipping;  Surgeon: Omer Jack, MD;  Location: [facility];  Service: Gastroenterology;  Laterality: N/A;    FRACTURE SURGERY  AGE 74     RT  ANKLE- CAST APPLIED    HERNIA REPAIR  AGE 28    LEFT INGUINAL HERNIA    MPI nuclear study  06/2016    orthopedic surgery  AGE 14    RT foot corrective - HAMMERTOE    RIGHT HEART CATH Right 09/09/2022    Procedure: Right Heart Cath;  Surgeon: Wandra Arthurs, MD;  Location: [facility];  Service: Cardiovascular;  Laterality: Right;  tx from [place] INCL. CO AND SATS Right 12/22/2022    Procedure: Right Heart Cath ONLY incl. CO and Sats;  Surgeon: Day, Lillette Boxer, MD;  Location: [facility];  Service: Cardiovascular;  Laterality: Right;  Right Heart Cath with leave in PA cath for milrinone initiation    [place]       Family History:     Family History   Problem Relation Age of Onset    Breast cancer Mother     Heart attack Mother 53    Hypertension Mother     Diabetes Mother     Coronary artery disease Mother     Diabetes Brother     Heart attack Father     Hypertension Father        Social History:     Social History     Socioeconomic  History    Marital status: Married     Spouse name: Architect    Number of children: 0    Years of education: Not on file    Highest education level: Not on file   Occupational History    Occupation: Pharmacist, 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[employer], history    Tobacco Use    Smoking status: Never    Smokeless tobacco: Never   Vaping Use    Vaping Use: Never used   Substance and Sexual Activity    Alcohol use: Not Currently    Drug use: No    Sexual activity: Yes     Partners: Female   Other Topics Concern    Not on file   Social History Narrative    Not on file     Social Determinants of Health     Financial Resource Strain: Low Risk  (12/22/2022)    Overall Financial Resource Strain (CARDIA)     Difficulty of Paying Living Expenses: Not hard at all   Food Insecurity: No Food Insecurity (09/12/2022)    Hunger Vital Sign     Worried About Running Out of Food in the Last Year: Never true     [place] in the Last Year: Never true   Transportation Needs: No Transportation Needs (12/22/2022)    PRAPARE - Armed forces logistics/support/administrative officer (Medical): No     Lack of Transportation (Non-Medical): No   Physical Activity: Not on file   Stress: Not on file   Social Connections: Not on file   Intimate Partner Violence: Not At Risk (09/12/2022)    Humiliation, Afraid, Rape, and Kick questionnaire     Fear of Current or Ex-Partner: No     Emotionally Abused: No     Physically Abused: No     Sexually Abused: No   Housing Stability: Low  Risk  (12/22/2022)    Housing Stability Vital Sign     Unable to Pay for Housing in the Last Year: No     Number of Places Lived in the Last Year: 1     Unstable Housing in the Last Year: No         REVIEW OF SYSTEMS REVIEWED:   YES  ( x )        HOME MEDICATIONS     Prior to Admission medications    Medication Sig Start Date End Date Taking? Authorizing Provider   apixaban (ELIQUIS) 5 MG Take 1 tablet (5 mg) by mouth every 12 (twelve) hours For 1 week followed by 1 tablet twice daily 09/14/22   Irma Newness, MD    aspirin EC 81 MG EC tablet Take 1 tablet (81 mg) by mouth daily    [provider]   atorvastatin (LIPITOR) 40 MG tablet Take 1 tablet (40 mg total) by mouth nightly 07/16/21   Carlis Abbott, MD   Calquence 100 MG Tab  04/07/22   [provider]   hydrALAZINE (APRESOLINE) 25 MG tablet Take 1 tablet (25 mg) by mouth every 8 (eight) hours 09/14/22 12/13/22  Irma Newness, MD   Insulin Glargine-yfgn (Semglee, yfgn,) 100 UNIT/ML Solution Pen-injector 50 Units by Subdermal route daily 10/26/22   Clearance Coots, MD   isosorbide dinitrate (ISORDIL) 10 MG tablet Take 1 tablet (10 mg) by mouth 3 (three) times daily 09/14/22 12/13/22  Irma Newness, MD   Jardiance 10 MG tablet TAKE 1 TABLET(10 MG) BY MOUTH DAILY 06/05/22   Clotilde Dieter, NP   loratadine (CLARITIN) 10 MG tablet Take 1 tablet (10 mg) by mouth daily 04/18/22   Regis Bill, MD   metoprolol succinate XL (TOPROL-XL) 25 MG 24 hr tablet Take 0.5 tablets (12.5 mg) by mouth daily 05/22/22   Clotilde Dieter, NP   niacin (NIASPAN) 500 MG CR tablet TAKE 1 TABLET BY MOUTH TWICE DAILY  Patient taking differently: 250 mg Taking half a tablet 02/23/22   Clearance Coots, MD   torsemide (DEMADEX) 20 MG tablet Take 2 tablets (40 mg) by mouth 2 (two) times daily 09/14/22   Irma Newness, MD         INPATIENT MEDICATIONS      Current Facility-Administered Medications   Medication Dose Route Frequency    albumin human        amiodarone  400 mg Oral TID    aspirin EC  81 mg Oral Daily    atorvastatin  40 mg Oral QHS    hydrALAZINE  75 mg Oral Q8H [place]    insulin glargine  20 Units Subcutaneous Q12H    Or    insulin glargine  10 Units Subcutaneous Q12H    insulin lispro  1-6 Units Subcutaneous QHS    insulin lispro  2-10 Units Subcutaneous TID AC    iron sucrose  200 mg Intravenous Q24H [place]    isosorbide dinitrate  20 mg Oral TID - Nitrate Free Interval    niacin  500 mg Oral BID    polyethylene glycol  17 g Oral Daily    senna-docusate  1 tablet Oral Q12H [place]      DOBUTamine  5 mcg/kg/min (12/25/22 1300)    furosemide (LASIX) infusion 2 mg/mL 20 mg/hr (12/25/22 1300)    heparin infusion 25,000 units/500 mL (VTE/Moderate Intensity) 18 Units/kg/hr (12/25/22 1300)     acetaminophen, albumin human, dextrose **OR** dextrose **OR** dextrose **OR** glucagon (rDNA),  phenol      ALLERGIES:     Allergies   Allergen Reactions    Allopurinol          PREVIOUS REACTION TO SEDATION MEDICATIONS     NO ( x )   YES (  )      PHYSICAL EXAM     AIRWAY CLASSIFICATION:    CLASS I   (  )   CLASS II  ( x )    CLASS III  (  )     CLASS IV  (  )    INTUBATED (  )    CARDIAC :   RRR      LUNGS:   NLR      LABS:     Lab Results   Component Value Date/Time    WBC 5.75 12/25/2022 03:28 AM    HCT 31.3 (L) 12/25/2022 03:28 AM    PLT 71 (L) 12/25/2022 03:28 AM    PLT 174 01/15/2016 10:33 AM    INR 1.4 (H) 12/24/2022 11:12 AM    PT 16.3 (H) 12/24/2022 11:12 AM    PTT 102 (H) 12/24/2022 11:12 AM    BUN 42.0 (H) 12/25/2022 03:28 AM    CREAT 2.7 (H) 12/25/2022 03:28 AM    CREAT 3.2 (H) 12/20/2022 08:38 PM    CREAT 1.06 01/15/2016 10:33 AM    GLU 283 (H) 12/25/2022 03:28 AM    K 3.6 12/25/2022 03:28 AM           ASA PHYSICAL STATUS   (  )  ASA 1   HEALTHY PATIENT  (  )  ASA 2   MILD SYSTEMIC ILLNESS  ( x )  ASA 3   SYSTEMIC DISEASE, NOT INCAPACITATING  (  )  ASA 4   SEVERE SYSTEMIC DISEASE, DISEASE IS CONSTANT THREAT TO LIFE  (  )  ASA 5   MORIBUND CONDITION, NOT EXPECTED TO LIVE >24 HOURS            IRRESPECTIVE OF PROCEDURE  (  )  E           EMERGENCY PROCEDURE       PLANNED SEDATION:   (  ) NO SEDATION  ( x ) MODERATE SEDATION  (  ) DEEP SEDATION WITH ANESTHESIA      CONCLUSION:   PATIENT HAS BEEN REASSESSED IMMEDIATELY PRIOR TO THE PROCEDURE   AND IS AN APPROPRIATE CANDIDATE FOR THE PLANNED SEDATION AND   PROCEDURE.  RISKS, BENEFITS AND ALTERNATIVES TO THE PLANNED   PROCEDURE AND SEDATION HAVE BEEN EXPLAINED TO THE PATIENT   OR GUARDIAN.    ( x )  YES  (  )  EMERGENCY CONSENT       Signed by: Estelle Grumbles, MD  [facility]-Section of Vascular & Interventional Radiology  Contact Numbers:  Regular business hours (8A-5P M-F):  [facility]:  [phone] (option 3-outpatient scheduling, option 4-consults, option 5-inpatient procedures)  [facility]:  [facility]: [phone]  After hours/Answering service:  [phone]

## 2022-12-26 DIAGNOSIS — I5082 Biventricular heart failure: Secondary | ICD-10-CM

## 2022-12-26 LAB — COMPREHENSIVE METABOLIC PANEL
ALT: 174 U/L — ABNORMAL HIGH (ref 0–55)
AST (SGOT): 49 U/L — ABNORMAL HIGH (ref 5–41)
Albumin/Globulin Ratio: 1 (ref 0.9–2.2)
Albumin: 3 g/dL — ABNORMAL LOW (ref 3.5–5.0)
Alkaline Phosphatase: 72 U/L (ref 37–117)
Anion Gap: 13 (ref 5.0–15.0)
BUN: 41 mg/dL — ABNORMAL HIGH (ref 9.0–28.0)
Bilirubin, Total: 1.7 mg/dL — ABNORMAL HIGH (ref 0.2–1.2)
CO2: 25 mEq/L (ref 17–29)
Calcium: 9.1 mg/dL (ref 7.9–10.2)
Chloride: 97 mEq/L — ABNORMAL LOW (ref 99–111)
Creatinine: 2.7 mg/dL — ABNORMAL HIGH (ref 0.5–1.5)
Globulin: 3.1 g/dL (ref 2.0–3.6)
Glucose: 284 mg/dL — ABNORMAL HIGH (ref 70–100)
Potassium: 3.8 mEq/L (ref 3.5–5.3)
Protein, Total: 6.1 g/dL (ref 6.0–8.3)
Sodium: 135 mEq/L (ref 135–145)
eGFR: 23.7 mL/min/{1.73_m2} — AB (ref >=60)

## 2022-12-26 LAB — NT-PROBNP: NT-proBNP: 9403 pg/mL — ABNORMAL HIGH (ref 0–450)

## 2022-12-26 LAB — CBC AND DIFFERENTIAL
Absolute NRBC: 0.04 10*3/uL — ABNORMAL HIGH (ref 0.00–0.00)
Basophils Absolute Automated: 0.01 10*3/uL (ref 0.00–0.08)
Basophils Automated: 0.2 %
Eosinophils Absolute Automated: 0.09 10*3/uL (ref 0.00–0.44)
Eosinophils Automated: 1.6 %
Hematocrit: 30.3 % — ABNORMAL LOW (ref 37.6–49.6)
Hgb: 10 g/dL — ABNORMAL LOW (ref 12.5–17.1)
Immature Granulocytes Absolute: 0.04 10*3/uL (ref 0.00–0.07)
Immature Granulocytes: 0.7 %
Instrument Absolute Neutrophil Count: 3.75 10*3/uL (ref 1.10–6.33)
Lymphocytes Absolute Automated: 1.67 10*3/uL (ref 0.42–3.22)
Lymphocytes Automated: 28.9 %
MCH: 32.9 pg (ref 25.1–33.5)
MCHC: 33 g/dL (ref 31.5–35.8)
MCV: 99.7 fL — ABNORMAL HIGH (ref 78.0–96.0)
MPV: 12.1 fL (ref 8.9–12.5)
Monocytes Absolute Automated: 0.21 10*3/uL (ref 0.21–0.85)
Monocytes: 3.6 %
Neutrophils Absolute: 3.75 10*3/uL (ref 1.10–6.33)
Neutrophils: 65 %
Nucleated RBC: 0.7 /100 WBC — ABNORMAL HIGH (ref 0.0–0.0)
Platelets: 65 10*3/uL — ABNORMAL LOW (ref 142–346)
RBC: 3.04 10*6/uL — ABNORMAL LOW (ref 4.20–5.90)
RDW: 16 % — ABNORMAL HIGH (ref 11–15)
WBC: 5.77 10*3/uL (ref 3.10–9.50)

## 2022-12-26 LAB — WHOLE BLOOD GLUCOSE POCT
Whole Blood Glucose POCT: 286 mg/dL — ABNORMAL HIGH (ref 70–100)
Whole Blood Glucose POCT: 212 mg/dL — ABNORMAL HIGH (ref 70–100)
Whole Blood Glucose POCT: 282 mg/dL — ABNORMAL HIGH (ref 70–100)
Whole Blood Glucose POCT: 320 mg/dL — ABNORMAL HIGH (ref 70–100)

## 2022-12-26 LAB — BASIC METABOLIC PANEL
Anion Gap: 13 (ref 5.0–15.0)
BUN: 44 mg/dL — ABNORMAL HIGH (ref 9.0–28.0)
CO2: 26 mEq/L (ref 17–29)
Calcium: 9.1 mg/dL (ref 7.9–10.2)
Chloride: 95 mEq/L — ABNORMAL LOW (ref 99–111)
Creatinine: 3.1 mg/dL — ABNORMAL HIGH (ref 0.5–1.5)
Glucose: 272 mg/dL — ABNORMAL HIGH (ref 70–100)
Potassium: 4 mEq/L (ref 3.5–5.3)
Sodium: 134 mEq/L — ABNORMAL LOW (ref 135–145)
eGFR: 20.1 mL/min/{1.73_m2} — AB (ref >=60)

## 2022-12-26 LAB — SEROTONIN RELEASE ASSAY (SRA), UNFRACTIONATED HEPARIN
UFH High Dose, 100 IU/mL: 0 % Release
UFH Low Dose, 0.1 IU/mL: 0 % Release
UFH Low Dose, 0.5 IU/mL: 0 % Release
UFH Serotonin Result: NEGATIVE

## 2022-12-26 LAB — ANTI-XA,UFH
Anti-Xa, UFH: <0.04 IU/mL
Anti-Xa, UFH: 0.46 IU/mL

## 2022-12-26 LAB — LACTIC ACID
Lactic Acid: 2.7 mmol/L — ABNORMAL HIGH (ref 0.2–2.0)
Lactic Acid: 2.3 mmol/L — ABNORMAL HIGH (ref 0.2–2.0)
Lactic Acid: 2.7 mmol/L — ABNORMAL HIGH (ref 0.2–2.0)

## 2022-12-26 LAB — HEPATIC FUNCTION PANEL (LFT)
Bilirubin Direct: 0.8 mg/dL — ABNORMAL HIGH (ref 0.0–0.5)
Bilirubin Indirect: 0.9 mg/dL (ref 0.2–1.0)

## 2022-12-26 LAB — MAGNESIUM
Magnesium: 2 mg/dL (ref 1.6–2.6)
Magnesium: 2.2 mg/dL (ref 1.6–2.6)

## 2022-12-26 MED ORDER — HYDRALAZINE HCL 50 MG PO TABS
100.0000 mg | ORAL_TABLET | Freq: Three times a day (TID) | ORAL | Status: DC
Start: 2022-12-26 — End: 2023-01-04
  Administered 2022-12-26 – 2023-01-04 (×26): 100 mg via ORAL
  Filled 2022-12-26 (×3): qty 4
  Filled 2022-12-26: qty 2
  Filled 2022-12-26: qty 4
  Filled 2022-12-26: qty 2
  Filled 2022-12-26 (×3): qty 4
  Filled 2022-12-26 (×4): qty 2
  Filled 2022-12-26: qty 4
  Filled 2022-12-26: qty 2
  Filled 2022-12-26: qty 4
  Filled 2022-12-26: qty 2
  Filled 2022-12-26: qty 4
  Filled 2022-12-26 (×3): qty 2
  Filled 2022-12-26: qty 4
  Filled 2022-12-26 (×3): qty 2
  Filled 2022-12-26: qty 4
  Filled 2022-12-26 (×2): qty 2

## 2022-12-26 MED ORDER — INSULIN GLARGINE 100 UNIT/ML SC SOLN
12.0000 [IU] | Freq: Two times a day (BID) | SUBCUTANEOUS | Status: DC
Start: 2022-12-26 — End: 2022-12-27

## 2022-12-26 MED ORDER — INSULIN LISPRO 100 UNIT/ML SOLN (WRAP)
5.0000 [IU] | Freq: Three times a day (TID) | Status: DC
Start: 2022-12-26 — End: 2022-12-27
  Administered 2022-12-26 – 2022-12-27 (×4): 5 [IU] via SUBCUTANEOUS
  Filled 2022-12-26 (×2): qty 15

## 2022-12-26 MED ORDER — HYDRALAZINE HCL 25 MG PO TABS
25.0000 mg | ORAL_TABLET | Freq: Once | ORAL | Status: AC
Start: 2022-12-26 — End: 2022-12-26
  Administered 2022-12-26: 25 mg via ORAL
  Filled 2022-12-26: qty 1

## 2022-12-26 MED ORDER — INSULIN GLARGINE 100 UNIT/ML SC SOLN
25.0000 [IU] | Freq: Two times a day (BID) | SUBCUTANEOUS | Status: DC
Start: 2022-12-26 — End: 2022-12-27
  Administered 2022-12-26: 25 [IU] via SUBCUTANEOUS
  Filled 2022-12-26: qty 25

## 2022-12-26 NOTE — Progress Notes (Signed)
4 eyes in 4 hours pressure injury assessment note:      Completed with: Enma RN  Unit & Time admitted: [id number]             Bony Prominences: Check appropriate box; if wound is present enter wound assessment in LDA     Occiput:                 '[x]'$ WNL  '[]'$  Wound present  Face:                     '[x]'$ WNL  '[]'$  Wound present  Ears:                      '[x]'$ WNL  '[]'$  Wound present  Spine:                    '[x]'$ WNL  '[]'$  Wound present  Shoulders:             '[x]'$ WNL  '[]'$  Wound present  Elbows:                  '[x]'$ WNL  '[]'$  Wound present  Sacrum/coccyx:     '[x]'$ WNL  '[]'$  Wound present  Ischial Tuberosity:  '[x]'$ WNL  '[]'$  Wound present  Trochanter/Hip:      '[x]'$ WNL  '[]'$  Wound present  Knees:                   '[x]'$ WNL  '[]'$  Wound present  Ankles:                   '[x]'$ WNL  '[]'$  Wound present  Heels:                    '[x]'$ WNL  '[]'$  Wound present  Other pressure areas:  '[]'$  Wound location       Device related: '[]'$  Device name:         LDA completed if wound present: yes  Consult WOCN if necessary    Other skin related issues, ie tears, rash, etc, document in Integumentary flowsheet     Scattered scars/bruising

## 2022-12-26 NOTE — Progress Notes (Signed)
Advanced Heart Failure and Transplant Progress Note    [employer] (NEW NAME): '[facility] Advanced Heart Failure Inpatient Team'      Advanced Heart Failure Cardiologist: Dr. Judeth Horn    Listed for transplant:  No due to active CLL    Assessment:   Heart failure-cardiogenic shock, SCAI stage C  Acute on chronic decompensated heart failure, LVEF less than 15%  Ischemic cardiomyopathy, NYHA class IV, ACC/AHA stage D, currently undergoing DT LVAD evaluation  RHC 09/09/22: RA 7, RV 39/4, PA 41/21/29, PCWP 14, PA sat 62%, RA sat 59%, AO sat 95%, Fick CO/CI 4.0/2.0, TD CO/CI 3.5/1.7, MAP 89, PAPi 3, PVR 3.75-4.3 WU  TTE 12/21/22: Severely reduced LV function with EF 11%, LVIDD 6.8 cm, global hypokinesis, mildly dilated RV with moderately decreased RV systolic function, severe functional MR, severe tricuspid regurgitation with RVSP 51 mmHg  RHC (12/22/22): RA 15, RV 48/1. 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 At this moment from a CLL standpoint, he is quite stable.  Right lower extremity DVT (5/23)   Type 2 diabetes with an A1c 5.9%  Stable COPD  Multiple hospital admissions for acute decompensated heart failure in 2023  DM2  Thrombocytopenia  Iron deficit anemia   Iron studies (3/6): Iron sat 10, Ferritin 57  Depression   Consult to psych   Aspiration event  Aspirated on  12/22/22, passed swallow eval thereafter   Recommendations   Heart failure-cardiogenic shock, SCAI stage C  Acute on chronic decompensated heart failure, LVEF less than 15%  Heart Failure Medical Therapy  BB:  Hold in setting of cardiogenic shock   ACE/ARB/ARNI: Hold in setting of worsening renal function   SGLT2i:  Hold for possible procedures, and reduced renal function    MRA: Hold in setting of worsening renal function.  Previously has had issues with hyperkalemia  Diuresis: : Lasix gtt at 20 mg/hr, goal net negative 2L  Inotrope: Dobutamine increased to 7.5 mcg/kg/min in the setting of lactic acidosis concerning for low cardiac output   Afterload: Hydralazine increased to 100 mg TID, Isordil 10 mg TID           Iron Deficiency Anemia   Ferritin 57, Iron Sat 10   Continue IV Venofer 200 mg x 5 days (last day 12/28/22)    Thrombocytopenia   HIT Ab negative, SRA pending   Seen by oncology (12/26/22): Marland Kitchen Suspect likely due to acute illness with cardiogenic shock and AKI necessitating inotropic support as above, less likely immune thrombocytopenia in the setting of CLL although unlikely given overall good control of disease. Platelet count is stable and safe >50K in the absence of bleeding at this time, however should he necessitate a procedure requiring higher PLT goal would consider trial of steroids for possible ITP.    Right lower extremity DVT  Diagnoseed 02/2022, likely  provoked in the setting of cancer  Was on apixaban at home, heparin gtt while hospitalized     Advanced Therapies Evaluation  Plan for LVAD education on Tuesday 3/12 at [facility]; will present at committee meeting on [place] 3/13  Psych consulted for depression  Social work assist ment of caregiver support   Awaiting improvement in renal function     I personally saw and examined the patient and spent 27 minutes in counseling, coordination and care. The patient was also seen by Dr. Eliezer Bottom. The assessment and plan were discussed and formulated together.      Jamesmichael Shadd, PA-C  Advanced Heart Failure/ VAD/ Cardiac Transplant Team  [phone]     --------------------------------------    I performed the substantive portion of this visit by providing more than 50% of the total time. I personally saw and examined the patient and spent (30) minutes. The patient was also seen by the APP and I reviewed the note and updated the documented findings and plan of care accordingly.    Antonieta Iba, MD   Advanced Heart Failure and Transplant Cardiology  Cardiac Intensive Care Unit  [facility]           Subjective/24 hour events:   - Bleeding from Hohn catheter overnight, heparin gtt briefly paused  - Lactic acid 2.7, 2.3 on repeat. Dobutamine increased to 7.5  - Net -2.2 L UO 4.1 on lasix gtt   - Patient denies chest pain, shortness of breath, lightheadedness, dizziness, palpitations. States he feels well today.      Objective:   Vital Signs in last 24 hours:  Temp:  [97.3 F (36.3 C)-98.7 F (37.1 C)] 98.7 F (37.1 C)  Heart Rate:  [78-88] 88  Resp Rate:  [14-31] 18  BP: (106-127)/(53-79) 116/58  SpO2: 98 %  O2 Device: None (Room air)    Vent Settings  FiO2: (S) 40 %    Telemetry: A-paced 85     Hemodynamics:  PAP: 55/18  PAP (Mean): 31 mmHg  CVP (mmHg): 11 mmHg     CO (L/min): 4.8 L/min  CI (L/min/m2): 2.2 L/min/m2  Cardiac Output (FICK): 5.28  Cardiac Index (FICK): 2.43  SVR (dyne*sec)/cm5: 1124 (dyne*sec)/cm5  SV (mL): 62 mL      Intake / Output:    Intake/Output Summary (Last 24 hours) at 12/26/2022 1414  Last data filed at 12/26/2022 1220  Gross per 24 hour   Intake 1474.38 ml   Output 3550 ml   Net -2075.62 ml          Weight:      12/24/2022     9:13 PM 12/25/2022     3:28 AM 12/26/2022     4:01 AM   Weight Monitoring   Height 182.9 cm 182.9 cm    Weight   89.54 kg   Weight Method   Bed Scale   BMI (calculated)   26.8 kg/m2        Weight change:        Physical Exam:   General: well-developed, alert, NAD  HEENT: conjunctiva pale, buccal mucosa moist,  anicteric sclera, no erythema, JVP not elevated   Lungs: Diminished bibasilar breath sounds, no rhonchi, wheezes or crackles with normal rate and effort  Cardiovascular: S1, S2, normal rate, regular rhythm, no m/r/g   Abdominal: soft, NT/ND, +B.S., no HSM  Extremities: warm to touch, no cyanosis, no lower extremity edema, peripheral pulses palpable bilaterally  Neuromuscular exam: alert and oriented x 3,  speech fluent   Skin: Pressure dressing over right chest, no rashes visualized on exposed skin     LDAs:  Patient Lines/Drains/Airways Status       Active Lines, Drains and Airways       Name Placement date Placement time Site Days    Implanted Catheter 12/25/22 Hohn Tunneled Right Internal jugular 12/25/22  1500  Internal jugular  less than 1    Peripheral IV 12/21/22 18 G Left Antecubital 12/21/22  2200  Antecubital  4    Peripheral IV 12/26/22 22 G Anterior;Left Forearm 12/26/22  1143  Forearm  less than 1    External Urinary Catheter 12/24/22  2000  --  1                      Current Meds:  amiodarone, 400 mg, Oral, TID  aspirin EC, 81 mg, Oral, Daily  atorvastatin, 40 mg, Oral, QHS  hydrALAZINE, 100 mg, Oral, Q8H [place]  insulin glargine, 20 Units, Subcutaneous, Q12H   Or  insulin glargine, 10 Units, Subcutaneous, Q12H  insulin lispro, 1-6 Units, Subcutaneous, QHS  insulin lispro, 2-10 Units, Subcutaneous, TID AC  insulin lispro, 5 Units, Subcutaneous, TID AC  iron sucrose, 200 mg, Intravenous, Q24H SCH  isosorbide dinitrate, 20 mg, Oral, TID - Nitrate Free Interval  niacin, 500 mg, Oral, BID  polyethylene glycol, 17 g, Oral, Daily  senna-docusate, 1 tablet, Oral, Q12H [facility]         Drips:   DOBUTamine 7.5 mcg/kg/min (12/26/22 1300)    heparin infusion 25,000 units/500 mL (VTE/Moderate Intensity) 18 Units/kg/hr (12/26/22 1317)         Labs:  CBC        12/26/22  0409 12/25/22  1714 12/25/22  0328   WBC 5.77 6.40 5.75   Hgb 10.0* 9.9* 10.2*   Hematocrit 30.3* 30.1* 31.3*   Platelets 65* 69* 71*       BMP         12/26/22  1141 12/26/22  0409 12/25/22  1714 12/25/22  0328 12/24/22  1513 12/24/22  0912 12/24/22  0301 12/23/22  2109 12/22/22  0424 12/21/22  2247 12/21/22  1837 12/20/22  2038 12/20/22  2034   BUN  --  41.0* 40.0* 42.0*   < >  --  46.0* 49.0*   < > 61.0* 60.0*   < > 48.0*   Creatinine  --  2.7* 2.6* 2.7*   < >  --  2.6* 2.8*   < > 4.2* 4.1*   < > 3.0*   Creatinine I-Stat  --   --   --   --   --   --   --   --   --   --   --    < >  --    eGFR  --  23.7* 24.8* 23.7*   < >  --  24.8* 22.7*   < > 13.9* 14.3*   < > 20.9*   Sodium  --  135 134* 134*   < >  --  132* 133*   < > 135 135   < > 142   Potassium  --  3.8 3.8 3.6   < >  --  4.1 3.9   < > 4.9 4.8   < > 4.5   Chloride  --  97* 97* 98*   < >  --  99 99   < > 101 103   < > 106  CO2  --  '25 25 24   '$ < >  --  21 22   < > 17 19   < > 17   Calcium  --  9.1 9.3 9.0   < >  --  8.5 8.3   < > 8.7 8.9   < > 10.1   Magnesium  --  2.0  --   --   --   --  2.2 2.2   < > 2.9* 2.8*   < > 2.9*   Phosphorus  --   --   --  2.9  --   --   --   --   --  7.2* 7.4*  --   --    Lactic Acid 2.3* 2.7*  --  1.3   < > 1.6 1.1 1.6   < > 2.2* 1.8   < >  --    NT-proBNP 9,403*  --   --   --   --   --   --   --   --  [id number]*  --   --  26,524*   LDH  --   --   --   --   --  507*  --   --   --   --   --   --   --     < > = values in this interval not displayed.       LFTs        12/26/22  0409 12/25/22  1714 12/25/22  0328 12/24/22  1513 12/24/22  0912 12/22/22  0424 12/21/22  2247   AST (SGOT) 49* 55* 57*   < > 72*   < > 426*   ALT 174* 191* 207*   < > 241*   < > 425*   Alkaline Phosphatase 72 72 79   < > 71   < > 81   Bilirubin, Total 1.7* 1.6* 1.5*   < > 1.2   < > 1.8*   Bilirubin Direct 0.8*  --   --   --  0.6*  --  0.9*   Bilirubin Indirect 0.9  --   --   --  0.6  --  0.9    < > = values in this interval not displayed.       Coags        12/26/22  0409 12/25/22  1714 12/25/22  0328 12/24/22  1112 12/24/22  0301 12/23/22  1713 12/23/22  1200 12/22/22  0424 12/21/22  2247   PT  --   --   --   16.3*  --  17.1*  --   --  29.0*   PT INR  --   --   --  1.4*  --  1.5*  --   --  2.5*   PTT  --   --   --  102*  --  33 63*   < > 32   Anti-Xa, UFH <0.04 0.52 0.51 0.59   < >  --   --   --  1.21*    < > = values in this interval not displayed.       ABGs          Microbiology  Microbiology Results (last 15 days)       Procedure Component Value Units Date/Time    MRSA culture [id number] Collected: 12/23/22 1200    Order Status: Completed Specimen: Culturette from Nasal Swab Updated: 12/24/22 1311  Culture MRSA Surveillance Negative for Methicillin Resistant Staph aureus    MRSA culture [id number] Collected: 12/23/22 1200    Order Status: Completed Specimen: Culturette from Throat Updated: 12/24/22 1311     Culture MRSA Surveillance Negative for Methicillin Resistant Staph aureus    Culture Blood Aerobic and Anaerobic [id number] Collected: 12/22/22 2239    Order Status: Completed Specimen: Blood, Venipuncture Updated: 12/26/22 [id number]    Narrative:      The order will result in two separate 8-62m bottles  Please do NOT order repeat blood cultures if one has been  drawn within the last 48 hours  UNLESS concerned for  endocarditis  AVOID BLOOD CULTURE DRAWS FROM CENTRAL LINE IF POSSIBLE  Indications:->Pneumonia  ORDER#: [id number]                                   ORDERED BY: GVear Clock SOURCE: Blood, Venipuncture                          COLLECTED:  12/22/22 22:39  ANTIBIOTICS AT COLL.:                                RECEIVED :  12/23/22 02:00  Culture Blood Aerobic and Anaerobic        PRELIM      12/26/22 02:29  12/24/22   No Growth after 1 day/s of incubation.  12/25/22   No Growth after 2 day/s of incubation.  12/26/22   No Growth after 3 day/s of incubation.      Culture Blood Aerobic and Anaerobic [[id number]: 12/22/22 2239    Order Status: Completed Specimen: Blood, Venipuncture Updated: 12/26/22 0229    Narrative:      The order will result in two separate 8-17mbottles  Please do NOT order  repeat blood cultures if one has been  drawn within the last 48 hours  UNLESS concerned for  endocarditis  AVOID BLOOD CULTURE DRAWS FROM CENTRAL LINE IF POSSIBLE  Indications:->Pneumonia  ORDER#: [id number]                                  ORDERED BY: GAVear ClockSOURCE: Blood, Venipuncture                          COLLECTED:  12/22/22 22:39  ANTIBIOTICS AT COLL.:                                RECEIVED :  12/23/22 01:59  Culture Blood Aerobic and Anaerobic        PRELIM      12/26/22 02:29  12/24/22   No Growth after 1 day/s of incubation.  12/25/22   No Growth after 2 day/s of incubation.  12/26/22   No Growth after 3 day/s of incubation.      Respiratory Pathogen Panel w/COVID-19, PCR [[id number]   Order Status: Canceled     COVID-19 (SARS-CoV-2) and Influenza A/B, NAA (Liat Rapid)- Admission [[id number]: 12/21/22 1050    Order Status: Completed Specimen: Culturette from Nasopharyngeal Updated: 12/21/22 1144     Purpose of COVID testing Diagnostic -  PUI     SARS-CoV-2 Specimen Source Nasal Swab     SARS CoV 2 Overall Result Not Detected     Comment: __________________________________________________  -A result of "Detected" indicates POSITIVE for the    presence of SARS CoV-2 RNA  -A result of "Not Detected" indicates NEGATIVE for the    presence of SARS CoV-2 RNA  __________________________________________________________  Test performed using the Roche cobas Liat SARS-CoV-2 assay. This assay is  only for use under the Food and Drug Administration's Emergency Use  Authorization. This is a real-time RT-PCR assay for the qualitative  detection of SARS-CoV-2 RNA. Viral nucleic acids may persist in vivo,  independent of viability. Detection of viral nucleic acid does not imply the  presence of infectious virus, or that virus nucleic acid is the cause of  clinical symptoms. Negative results do not preclude SARS-CoV-2 infection and  should not be used as the sole basis for diagnosis, treatment or  other  patient management decisions. Negative results must be combined with  clinical observations, patient history, and/or epidemiological information.  Invalid results may be due to inhibiting substances in the specimen and  recollection should occur. Please see Fact Sheets for patients and providers  located:  https://www.benson-chung.com/          Influenza A Not Detected     Influenza B Not Detected     Comment: Test performed using the Roche cobas Liat SARS-CoV-2 & Influenza A/B assay.  This assay is only for use under the Food and Drug Administration's  Emergency Use Authorization. This is a multiplex real-time RT-PCR assay  intended for the simultaneous in vitro qualitative detection and  differentiation of SARS-CoV-2, influenza A, and influenza B virus RNA. Viral  nucleic acids may persist in vivo, independent of viability. Detection of  viral nucleic acid does not imply the presence of infectious virus, or that  virus nucleic acid is the cause of clinical symptoms. Negative results do  not preclude SARS-CoV-2, influenza A, and/or influenza B infection and  should not be used as the sole basis for diagnosis, treatment or other  patient management decisions. Negative results must be combined with  clinical observations, patient history, and/or epidemiological information.  Invalid results may be due to inhibiting substances in the specimen and  recollection should occur. Please see Fact Sheets for patients and providers  located: http://olson-hall.info/.         Narrative:      o Collect and clearly label specimen type:  o PREFERRED-Upper respiratory specimen: One Nasal Swab in  Transport Media.  o Hand deliver to laboratory ASAP  Diagnostic -PUI    Respiratory Pathogen Panel w/COVID-19, PCR [id number] Collected: 12/21/22 0106    Order Status: Canceled Specimen: Nasopharyngeal     Culture Blood Aerobic and Anaerobic [id number] Collected: 12/21/22 0021    Order Status:  Completed Specimen: Blood, Venipuncture Updated: 12/26/22 [id number]    Narrative:      Indications:->Other  Other->infection  ORDER#: [id number]                                    ORDERED BY: LEE, SIEW MEI  SOURCE: Blood, Venipuncture RAC                      COLLECTED:  12/21/22 00:21  ANTIBIOTICS AT COLL.:  RECEIVED :  12/21/22 04:06  Culture Blood Aerobic and Anaerobic        FINAL       12/26/22 06:37  12/26/22   No growth after 5 days of incubation.      Culture Blood Aerobic and Anaerobic [id number] Collected: 12/21/22 0021    Order Status: Completed Specimen: Blood, Venipuncture Updated: 12/26/22 1147    Narrative:      [id number] called Micro Results of Pos Bld. Results read back by:[id number], by [id number]  on 12/22/2022 at 07:29  Indications:->Other  Other->infection  ORDER#: [id number]                                    ORDERED BY: LEE, SIEW MEI  SOURCE: Blood, Venipuncture LAC                      COLLECTED:  12/21/22 00:21  ANTIBIOTICS AT COLL.:                                RECEIVED :  12/21/22 04:06  [id number] called Micro Results of Pos Bld. Results read back by:[id number], by [phone] on 12/22/2022 at 07:29  Culture Blood Aerobic and Anaerobic        FINAL       12/26/22 11:47   +  12/22/22   Aerobic Blood Culture Positive in 24 to 48 hours             Gram Stain Shows: Gram positive cocci in clusters  12/26/22   Anaerobic Blood Culture No Growth  12/23/22   Growth of Staphylococcus (coagulase negative)               Possible skin contaminant, susceptibility testing not             performed without request unless additional blood cultures,             collected within 48 hours of this culture, become positive.             Contact the laboratory for further information if necessary.        MRSA culture [id number] Collected: 12/21/22 0000    Order Status: Canceled Specimen: Culturette from Nasal Swab     MRSA culture [id number] Collected: 12/21/22 0000    Order Status: Canceled Specimen:  Culturette from Throat               Cardiac Studies:  Echocardiogram: 12/21/22   * The left ventricle is moderately dilated with LVIDd 6.8 cm.    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    * Severe global hypokinesis with regional variation.    * The right ventricular cavity size is mildly dilated.    * Moderately decreased right ventricular systolic function.    * There is severe mitral regurgitation which is functional secondary to  dilated left ventricle.    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    * Compared to the prior study dated 09/08/22, the EF remains severely  reduced and there is more significant mitral and tricuspid regurgitation.      Right Heart Catheterization: 12/22/22  Imaging:  Radiology Results (24 Hour)       Procedure Component Value Units Date/Time    Tunneled Cath Placement (Permcath) [id number] Collected: 12/25/22 1521    Order Status: Completed Updated: 12/25/22 1524    Narrative:      PROCEDURE: Tunneled central venous catheter placement  CLINICAL HISTORY: IV access for home dobutamine    OPERATOR(S):  Attending VIR Physician(s): Erik Obey, MD  Advanced Practice Provider: None    CONTRAST:  Contrast agent: None  Contrast volume (mL): 0    RADIATION PARAMETERS:  Fluoroscopy time (minutes): 2.    Reference air kerma (mGy): 13.4     TECHNIQUE/FINDINGS: Informed consent for the procedure including risks,  benefits and alternatives was obtained. A safety timeout was performed and  documented with all members of the procedure team present, including  verification of patient identification, correct procedure, procedure site,  and laterality. The patient was positioned supine.     Preparation: The site was prepared and draped using all elements of maximal  sterile barrier technique including sterile gloves,  sterile gown, cap,  mask, large sterile sheet, sterile ultrasound probe cover, sterile  ultrasound gel, hand hygiene and cutaneous antisepsis with 2%  chlorhexidine.  Medical reason for site preparation exception: Not applicable.    Sedation: The patient was placed under no additional IV sedation which was  monitored by the performing provider     Sonographic interrogation of the right internal jugular vein demonstrated  patency. Local anesthesia was administered at the access site. A small  dermatotomy with an #11 scalpel was made and venous access was achieved  with a 21 gauge single wall needle. Sonographic images were obtained pre-  and post- puncture for documentation.  A 0.018 inch guidewire was advanced  into the IVC. The needle was exchanged for a 5 French peel-away sheath.  Using fluoroscopic guidance, the 0.018 inch guidewire was removed and  marked at the appropriate length for the intravascular portion of the  catheter and a 0.035 inch wire was then advanced into the inferior vena  cava and an image was saved for documentation.     A catheter exit site was chosen caudal to the clavicle and lateral to the  venotomy site.  This was anesthetized with 1% lidocaine with epinephrine.   The catheter was then tunneled from the exit site to the venotomy site.  It  was fed through a peel-away sheath.  The catheter was introduced into a  vessel over a guidewire.  The catheter tip was positioned under  fluoroscopic guidance.  Both lumina aspirated easily and were flushed.  The  venotomy site was closed with tissue adhesive.  A sterile dressing was  applied.  A fluoroscopic image was obtained to document Hohntip position at  the superior cavoatrial junction.    All indicated images were saved to PACS.      Impression:        Right internal jugular vein approach 6 French single lumen Hohn catheter  tip in the superior cavoatrial junction.    PLAN:   Catheter is ready for use.    Erik Obey, MD  12/25/2022 3:22 PM

## 2022-12-26 NOTE — Progress Notes (Signed)
HEMATOLOGY ONCOLOGY PROGRESS NOTE  [facility] [place]   [phone]    Date: 12/26/22    [name] [name] is a 77 y.o. male who follows with Dr. 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 Currently on low-dose dobutamine and intermittent diuresis. Pending LVAD evaluation. Management per cardiology and nephrology.    #History of DVT  Initially diagnosed 02/2022, was on apixaban prior to admission and continued on heparin gtt on admission. Last LE [place] 04/2022 with resolution. In the setting of recent bleeding/thrombocytopenia, agree with holing gtt and once stable/feasible would recommend resuming low-dose heparin SC for DVT ppx in the short term.    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 Currently holding in the setting of thrombocytopenia with PLT ~70K from normal baseline. Unclear etiology, nutritional studies and hemolysis labs normal, HIT Ab negative although SRA in process. Suspect likely due to acute illness with cardiogenic shock and AKI necessitating inotropic support as above, less likely immune thrombocytopenia in the setting of CLL although unlikely given overall good control of disease. He platelet count is stable and safe >50K in the absence of bleeding at this time, however should he necessitate a procedure requiring higher PLT goal would consider trial of steroids for possible ITP.    Thank you for allowing [place] to participate in the care of this patient.     Vincent Gros, MD  Hematology/Oncology  [facility]  [phone]    Subjective:   Overnight significantly bleeding from RIJ venous sheath insertion site s/p removal, pressure dressing in place. Heparin paused. H/H and PLT remain stable, no other s/s of  bleeding.     Physical Exam     Vitals:    12/26/22 0856   BP: 112/59   Pulse:    Resp:    Temp:    SpO2:      Chronically ill appearing, no distress  AAOx3, MMM, EOMI  R neck pressure dressing in place without evidence of bleeding  Breathing comfortably on room air    Medications     Current Facility-Administered Medications   Medication Dose Route Frequency    amiodarone  400 mg Oral TID    aspirin EC  81 mg Oral Daily    atorvastatin  40 mg Oral QHS    hydrALAZINE  75 mg Oral Q8H [place]    insulin glargine  20 Units Subcutaneous Q12H    Or    insulin glargine  10 Units Subcutaneous Q12H    insulin lispro  1-6 Units Subcutaneous QHS    insulin lispro  2-10 Units Subcutaneous TID AC    insulin lispro  5 Units Subcutaneous TID AC    iron sucrose  200 mg Intravenous Q24H [facility]    isosorbide dinitrate  20 mg Oral TID - Nitrate Free Interval    niacin  500 mg Oral BID    polyethylene glycol  17 g Oral Daily    senna-docusate  1 tablet Oral Q12H [facility]         Labs:     Results       Procedure Component Value Units Date/Time    Glucose Whole Blood - POCT [id number]  (Abnormal) Collected: 12/26/22 [id number]  Updated: 12/26/22 0841     Whole Blood Glucose POCT 286 mg/dL     Magnesium [id number] Collected: 12/26/22 0409    Specimen: Blood Updated: 12/26/22 0544     Magnesium 2.0 mg/dL     Hepatic function panel (LFT) [id number]  (Abnormal) Collected: 12/26/22 0409    Specimen: Blood Updated: 12/26/22 0544     Bilirubin Direct 0.8 mg/dL      Bilirubin Indirect 0.9 mg/dL     Comprehensive metabolic panel [id number]  (Abnormal) Collected: 12/26/22 0409    Specimen: Blood Updated: 12/26/22 0544     Glucose 284 mg/dL      BUN 41.0 mg/dL      Creatinine 2.7 mg/dL      Sodium 135 mEq/L      Potassium 3.8 mEq/L      Chloride 97 mEq/L      CO2 25 mEq/L      Calcium 9.1 mg/dL      Protein, Total 6.1 g/dL      Albumin 3.0 g/dL      AST (SGOT) 49 U/L      ALT 174 U/L      Alkaline Phosphatase 72 U/L      Bilirubin, Total 1.7 mg/dL       Globulin 3.1 g/dL      Albumin/Globulin Ratio 1.0     Anion Gap 13.0     eGFR 23.7 mL/min/1.73 m2     CBC and differential [id number]  (Abnormal) Collected: 12/26/22 0409    Specimen: Blood Updated: 12/26/22 0450     WBC 5.77 x10 3/uL      Hgb 10.0 g/dL      Hematocrit 30.3 %      Platelets 65 x10 3/uL      RBC 3.04 x10 6/uL      MCV 99.7 fL      MCH 32.9 pg      MCHC 33.0 g/dL      RDW 16 %      MPV 12.1 fL      Instrument Absolute Neutrophil Count 3.75 x10 3/uL      Neutrophils 65.0 %      Lymphocytes Automated 28.9 %      Monocytes 3.6 %      Eosinophils Automated 1.6 %      Basophils Automated 0.2 %      Immature Granulocytes 0.7 %      Nucleated RBC 0.7 /100 WBC      Neutrophils Absolute 3.75 x10 3/uL      Lymphocytes Absolute Automated 1.67 x10 3/uL      Monocytes Absolute Automated 0.21 x10 3/uL      Eosinophils Absolute Automated 0.09 x10 3/uL      Basophils Absolute Automated 0.01 x10 3/uL      Immature Granulocytes Absolute 0.04 x10 3/uL      Absolute NRBC 0.04 x10 3/uL     Anti-Xa, UFH [id number] Collected: 12/26/22 0409     Updated: 12/26/22 0449     Anti-Xa, UFH <0.04 IU/mL     Narrative:      Every 8 hours after initiation and every rate change. After  two subsequent values within goal range, change frequency to  IHS daily at 0400 until heparin is discontinued.  If heparin infusion is on MAR hold do not draw this lab.    Lactic Acid [id number]  (Abnormal) Collected: 12/26/22 0409    Specimen: Blood Updated: 12/26/22 0417     Lactic Acid 2.7 mmol/L  Narrative:      To be drawn with each measure of co-oximetry.    Culture Blood Aerobic and Anaerobic [id number] Collected: 12/22/22 2239    Specimen: Blood, Venipuncture Updated: 12/26/22 0229    Narrative:      The order will result in two separate 8-31m bottles  Please do NOT order repeat blood cultures if one has been  drawn within the last 48 hours  UNLESS concerned for  endocarditis  AVOID BLOOD CULTURE DRAWS FROM CENTRAL LINE IF  POSSIBLE  Indications:->Pneumonia  ORDER#: [id number]                                   ORDERED BY: GVear Clock SOURCE: Blood, Venipuncture                          COLLECTED:  12/22/22 22:39  ANTIBIOTICS AT COLL.:                                RECEIVED :  12/23/22 02:00  Culture Blood Aerobic and Anaerobic        PRELIM      12/26/22 02:29  12/24/22   No Growth after 1 day/s of incubation.  12/25/22   No Growth after 2 day/s of incubation.  12/26/22   No Growth after 3 day/s of incubation.      Culture Blood Aerobic and Anaerobic [[id number]: 12/22/22 2239    Specimen: Blood, Venipuncture Updated: 12/26/22 0229    Narrative:      The order will result in two separate 8-1107mbottles  Please do NOT order repeat blood cultures if one has been  drawn within the last 48 hours  UNLESS concerned for  endocarditis  AVOID BLOOD CULTURE DRAWS FROM CENTRAL LINE IF POSSIBLE  Indications:->Pneumonia  ORDER#: [id number]                                  ORDERED BY: GAVear ClockSOURCE: Blood, Venipuncture                          COLLECTED:  12/22/22 22:39  ANTIBIOTICS AT COLL.:                                RECEIVED :  12/23/22 01:59  Culture Blood Aerobic and Anaerobic        PRELIM      12/26/22 02:29  12/24/22   No Growth after 1 day/s of incubation.  12/25/22   No Growth after 2 day/s of incubation.  12/26/22   No Growth after 3 day/s of incubation.      Glucose Whole Blood - POCT [[id number](Abnormal) Collected: 12/25/22 2203     Updated: 12/25/22 2215     Whole Blood Glucose POCT 227 mg/dL     Type and Screen [[id number]: 12/25/22 1714    Specimen: Blood Updated: 12/25/22 1827     ABO Rh O POS     AB Screen Gel NEG    Narrative:      Pre-Surgical/Pre-Procedure->No  For transfusion?->Yes    Comprehensive metabolic panel [[id number](Abnormal) Collected: 12/25/22 1714  Specimen: Blood Updated: 12/25/22 1746     Glucose 245 mg/dL      BUN 40.0 mg/dL      Creatinine 2.6 mg/dL      Sodium 134  mEq/L      Potassium 3.8 mEq/L      Chloride 97 mEq/L      CO2 25 mEq/L      Calcium 9.3 mg/dL      Protein, Total 6.2 g/dL      Albumin 3.1 g/dL      AST (SGOT) 55 U/L      ALT 191 U/L      Alkaline Phosphatase 72 U/L      Bilirubin, Total 1.6 mg/dL      Globulin 3.1 g/dL      Albumin/Globulin Ratio 1.0     Anion Gap 12.0     eGFR 24.8 mL/min/1.73 m2     Anti-Xa, UFH [id number] Collected: 12/25/22 1714     Updated: 12/25/22 1736     Anti-Xa, UFH 0.52 IU/mL     Narrative:      Every 8 hours after initiation and every rate change. After  two subsequent values within goal range, change frequency to  IHS daily at 0400 until heparin is discontinued.  If heparin infusion is on MAR hold do not draw this lab.    CBC and differential [id number]  (Abnormal) Collected: 12/25/22 1714    Specimen: Blood Updated: 12/25/22 1735     WBC 6.40 x10 3/uL      Hgb 9.9 g/dL      Hematocrit 30.1 %      Platelets 69 x10 3/uL      RBC 3.04 x10 6/uL      MCV 99.0 fL      MCH 32.6 pg      MCHC 32.9 g/dL      RDW 16 %      MPV 12.2 fL      Instrument Absolute Neutrophil Count 4.22 x10 3/uL      Neutrophils 65.8 %      Lymphocytes Automated 28.9 %      Monocytes 2.7 %      Eosinophils Automated 1.6 %      Basophils Automated 0.2 %      Immature Granulocytes 0.8 %      Nucleated RBC 0.5 /100 WBC      Neutrophils Absolute 4.22 x10 3/uL      Lymphocytes Absolute Automated 1.85 x10 3/uL      Monocytes Absolute Automated 0.17 x10 3/uL      Eosinophils Absolute Automated 0.10 x10 3/uL      Basophils Absolute Automated 0.01 x10 3/uL      Immature Granulocytes Absolute 0.05 x10 3/uL      Absolute NRBC 0.03 x10 3/uL     Glucose Whole Blood - POCT [id number]  (Abnormal) Collected: 12/25/22 1614     Updated: 12/25/22 1616     Whole Blood Glucose POCT 239 mg/dL     Glucose Whole Blood - POCT [id number]  (Abnormal) Collected: 12/25/22 1132     Updated: 12/25/22 1135     Whole Blood Glucose POCT 227 mg/dL               Imaging   Tunneled Cath Placement  (Permcath)    Result Date: 12/25/2022  Right internal jugular vein approach 6 French single lumen Hohn catheter tip in the superior cavoatrial junction. PLAN: Catheter is ready for use. Erik Obey, MD 12/25/2022  3:22 PM    X-ray chest AP portable    Result Date: 12/25/2022  No change mild edema [name], MD 12/25/2022 8:36 AM    X-ray chest AP portable    Result Date: 12/24/2022  Stable exam. No new acute intrathoracic process. Montine Circle, MD 12/24/2022 11:44 AM    X-ray chest AP portable    Result Date: 12/23/2022  Improved inspiratory effort with mild dependent atelectasis [name], MD 12/23/2022 8:16 AM    XR Chest AP Portable    Result Date: 12/23/2022   Airspace disease in the medial right lower lobe. Blanchard Mane 12/23/2022 12:46 AM    XR Chest AP Portable    Result Date: 12/22/2022  1. Right Swan-Ganz catheter tip in the proximal right lower pulmonary artery. No pneumothorax. 2. Mild vascular congestion, small right pleural effusion and right basilar atelectasis. Ardelia Mems, MD 12/22/2022 4:59 PM    Chest AP Portable    Result Date: 12/20/2022  No acute intrathoracic process. Montine Circle, MD 12/20/2022 9:12 PM

## 2022-12-26 NOTE — Plan of Care (Signed)
A&Ox4. RA. External catheter. Last BM 3/8. X1-2 assist. Dobutamine gtt and lasix gtt infusing. Pt admitted from CICU. Hep gtt paused d/t bleeding.    Problem: Compromised Sensory Perception  Goal: Sensory Perception Interventions  Outcome: Progressing  Flowsheets (Taken 12/26/2022 0034)  Sensory Perception Interventions: Offload heels, Pad bony prominences, Reposition q 2hrs/turn Clock, Q2 hour skin assessment under devices if present     Problem: Compromised Moisture  Goal: Moisture level Interventions  Outcome: Progressing  Flowsheets (Taken 12/26/2022 0034)  Moisture level Interventions: Moisture wicking products, Moisture barrier cream     Problem: Compromised Activity/Mobility  Goal: Activity/Mobility Interventions  Outcome: Progressing  Flowsheets (Taken 12/26/2022 0034)  Activity/Mobility Interventions: Pad bony prominences, TAP Seated positioning system when OOB, Promote PMP, Reposition q 2 hrs / turn clock, Offload heels     Problem: Compromised Nutrition  Goal: Nutrition Interventions  Outcome: Progressing  Flowsheets (Taken 12/26/2022 0034)  Nutrition Interventions: Discuss nutrition at Rounds, I&Os, Document % meal eaten, Daily weights     Problem: Compromised Friction/Shear  Goal: Friction and Shear Interventions  Outcome: Progressing  Flowsheets (Taken 12/26/2022 0034)  Friction and Shear Interventions: Pad bony prominences, Off load heels, HOB 30 degrees or less unless contraindicated, Consider: TAP seated positioning, Heel foams     Problem: Moderate/High Fall Risk Score >5  Goal: Patient will remain free of falls  Outcome: Progressing  Flowsheets (Taken 12/26/2022 0041)  High (Greater than 13):   HIGH-Visual cue at entrance to patient's room   HIGH-Bed alarm on at all times while patient in bed   HIGH-Utilize chair pad alarm for patient while in the chair   HIGH-Apply yellow "Fall Risk" arm band   HIGH-Initiate use of floor mats as appropriate   HIGH-Consider use of low bed     Problem: Hemodynamic Status:  Cardiac  Goal: Stable vital signs and fluid balance  Outcome: Progressing  Flowsheets (Taken 12/25/2022 0106 by Oris Drone, RN)  Stable vital signs and fluid balance:   Assess signs and symptoms associated with cardiac rhythm changes   Monitor lab values     Problem: Inadequate Gas Exchange  Goal: Adequate oxygenation and improved ventilation  Outcome: Progressing  Flowsheets (Taken 12/26/2022 0559)  Adequate oxygenation and improved ventilation:   Assess lung sounds   Monitor SpO2 and treat as needed   Monitor and treat ETCO2   Provide mechanical and oxygen support to facilitate gas exchange   Teach/reinforce use of incentive spirometer 10 times per hour while awake, cough and deep breath as needed   Position for maximum ventilatory efficiency   Plan activities to conserve energy: plan rest periods   Increase activity as tolerated/progressive mobility   Consult/collaborate with Respiratory Therapy

## 2022-12-26 NOTE — Progress Notes (Signed)
[facility].  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  -CAD with past PCI   -Mild hyponatremia due to hypervolemia   -Non gap acidosis due to CKD                Plan:     -No need for RRT at this time   -Continue with Lasix drip at 20 mg/hr               UOP 4.1L, negative 2.2L,   -supportive care   -please try to avoid contrast studies and nephrotoxins   -daily labs       [name], MD  Epic Secure Chat: direct or group "[facility]  [place]  Office - [phone]  ++++++++++++++++++++++++++++++++++++++++++++++++++++++++++++++  Chief Complaint:    Interval History:    Medications:  Scheduled Meds:  Current Facility-Administered Medications   Medication Dose Route Frequency    amiodarone  400 mg Oral TID    aspirin EC  81 mg Oral Daily    atorvastatin  40 mg Oral QHS    hydrALAZINE  75 mg Oral Q8H [place]    insulin glargine  20 Units Subcutaneous Q12H    Or    insulin glargine  10 Units Subcutaneous Q12H    insulin lispro  1-6 Units Subcutaneous QHS    insulin lispro  2-10 Units Subcutaneous TID AC    insulin lispro  5 Units Subcutaneous TID AC    iron sucrose  200 mg Intravenous Q24H [place]    isosorbide dinitrate  20 mg Oral TID - Nitrate Free Interval    niacin  500 mg Oral BID    polyethylene glycol  17 g Oral Daily    senna-docusate  1 tablet Oral Q12H [place]     Continuous Infusions:   DOBUTamine 5 mcg/kg/min (12/25/22 1800)    furosemide (LASIX) infusion 2 mg/mL 20 mg/hr (12/26/22 0253)    [Held by provider] heparin infusion 25,000 units/500 mL (VTE/Moderate  Intensity) Stopped (12/25/22 1715)     PRN Meds:acetaminophen, dextrose **OR** dextrose **OR** dextrose **OR** glucagon (rDNA), phenol    Objective:  Vital signs in last 24 hours:  Temp:  [97.3 F (36.3 C)-98 F (36.7 C)] 97.8 F (36.6 C)  Heart Rate:  [76-86] 81  Resp Rate:  [14-31] 19  BP: (106-127)/(53-79) 112/59  Intake/Output last 24 hours:    Intake/Output Summary (Last 24 hours) at 12/26/2022 0842  Last data filed at 12/26/2022 0635  Gross per 24 hour   Intake 1661.38 ml   Output 3950 ml   Net -2288.62 ml     Intake/Output this shift:  No intake/output data recorded.    Physical Exam:   Gen: Well developed, no acute distress   CV: S1 S2 N RRR, no edema   Chest: Good effort, CTAB    Labs:  Recent Labs   Lab 12/26/22  0409 12/25/22  1714 12/25/22  0328 12/24/22  0912 12/24/22  0301 12/23/22  2109 12/22/22  0424 12/21/22  2247 12/21/22  1837   Glucose 284* 245* 283*  More results in Results Review 276* 297*  More results in Results Review 199* 167*   BUN 41.0* 40.0* 42.0*  More results in Results Review 46.0* 49.0*  More results in Results Review 61.0* 60.0*   Creatinine 2.7* 2.6* 2.7*  More results in Results Review 2.6* 2.8*  More results in Results Review 4.2* 4.1*   Calcium 9.1 9.3 9.0  More results in Results Review 8.5 8.3  More results in Results Review 8.7 8.9   Sodium 135 134* 134*  More results in Results Review 132* 133*  More results in Results Review 135 135   Potassium 3.8 3.8 3.6  More results in Results Review 4.1 3.9  More results in Results Review 4.9 4.8   Chloride 97* 97* 98*  More results in Results Review 99 99  More results in Results Review 101 103   CO2 '25 25 24  '$ More results in Results Review 21 22  More results in Results Review 17 19   Albumin 3.0* 3.1* 3.0*  More results in Results Review 3.0* 2.9*  More results in Results Review 3.1*  --    Phosphorus  --   --  2.9  --   --   --   --  7.2* 7.4*   Magnesium 2.0  --   --   --  2.2 2.2  More results in Results Review 2.9* 2.8*   More  results in Results Review = values in this interval not displayed.     Recent Labs   Lab 12/26/22  0409 12/25/22  1714 12/25/22  0328   WBC 5.77 6.40 5.75   Hgb 10.0* 9.9* 10.2*   Hematocrit 30.3* 30.1* 31.3*   MCV 99.7* 99.0* 101.0*   MCH 32.9 32.6 32.9   MCHC 33.0 32.9 32.6   RDW 16* 16* 16*   MPV 12.1 12.2 12.6*   Platelets 65* 69* 71*

## 2022-12-26 NOTE — Progress Notes (Signed)
Shift comment  Patient  A&O,RA . He is on dobutamine gtt, also heparin gtt was restarted. furosemide gtt was [place]/d.     Plan  Monitor labs and treat when necessary,  Continue dobutamine gtt @ 7.5 mcg/kg/min  Continue heparin gtt @ 18 units/kg/hr  Monitor R IJ site for bleeding  Daily weight and Q12H BMP.  Maintain patient safety

## 2022-12-26 NOTE — Progress Notes (Signed)
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  He was admitted at the ICU there for further management of heart failure and SVT then transferred to [place] for cardiogenic shock and LVAD consideration.  He was started on dobutamine and diuresed with Lasix drip.  He had congestive hepatopathy and cardiorenal AKI which are all improving.  Plan for further diuresis and inpatient LVAD evaluation.    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  - Continue afterload reduction: Hydralazine, isosorbide dinitrate  - Inotrope therapy: Dobutamine drip via Hohn catheter-currently being evaluated for LVAD  - GDMT: On hold while actively diuresing and renal function improved    #Cardiorenal AKI on CKD stage 4-baseline creatinine around 2.5  #Non-anion gap metabolic acidosis secondary to CKD  - Continue Lasix drip  - BMP, mg twice daily  - [facility] following    #Insulin-dependent type 2 diabetes-A1c of 7.7  - increase Lantus 25 units twice daily, high-dose sliding scale insulin, add lispro 5 units 3 times daily    #History of right common femoral vein DVT  - Holding home Eliquis  - Continue heparin drip, had issues with home catheter site bleeding on the night of 3/8, restart today and monitor    #SVT  - P.o. amiodarone at loading dose, once this is done should convert to maintenance dose    #Iron deficiency anemia  - IV  Venofer    #Situational anxiety versus depression  - Psychiatry to see patient on 3/11    #Acute hypoxic respiratory failure secondary to HFrEF exacerbation-resolved    #Moderate thrombocytopenia-suspect secondary to acute illness, consumptive process  - Platelets around 60-80  - VCS following    #CAD  - Continue statin, aspirin    #History of CLL  - Hold acalabrutinib per hematology recommendations      Dvt ppx: Heparin drip    Case discussed with: Patient, family, bedside RN      Additional Diagnoses:               Lines:     Patient Lines/Drains/Airways Status       Active PICC Line / CVC Line / PIV Line / Drain / Airway / Intraosseous Line / Epidural Line / ART Line / Line / Wound / Pressure Ulcer / NG/OG Tube       Name Placement date Placement time Site Days    Implanted Catheter 12/25/22 Hohn Tunneled Right Internal jugular 12/25/22  1500  Internal jugular  less than 1    Peripheral IV 12/21/22 18 G Left Antecubital 12/21/22  2200  Antecubital  4    Peripheral IV 12/26/22 22 G Anterior;Left Forearm 12/26/22  1143  Forearm  less than 1    External Urinary Catheter 12/24/22  2000  --  1                       Subjective  CC: Heart failure    HPI/Subjective: No shortness of breath, no pain at home catheter site    Review of Systems:     As per HPI    Physical Exam:     VITAL SIGNS PHYSICAL EXAM   Temp:  [97.3 F (36.3 C)-98.7 F (37.1 C)] 98.7 F (37.1 C)  Heart Rate:  [78-88] 88  Resp Rate:  [14-31] 18  BP: (106-127)/(53-79) 116/58  Blood Glucose:        Intake/Output Summary (Last 24 hours) at 12/26/2022 1411  Last data filed at 12/26/2022 1220  Gross per 24 hour   Intake 1474.38 ml   Output 3550 ml   Net -2075.62 ml    Physical Exam  General: awake, alert X O x3  Cardiovascular: regular rate and rhythm, no murmurs, rubs or gallops  Lungs: clear to auscultation bilaterally, without wheezing, rhonchi, or rales  Abdomen: soft, non-tender, non-distended;   Extremities: no edema         Meds:     Medications were  reviewed:  Current Facility-Administered Medications   Medication Dose Route Frequency    amiodarone  400 mg Oral TID    aspirin EC  81 mg Oral Daily    atorvastatin  40 mg Oral QHS    hydrALAZINE  100 mg Oral Q8H [facility]    insulin glargine  20 Units Subcutaneous Q12H    Or    insulin glargine  10 Units Subcutaneous Q12H    insulin lispro  1-6 Units Subcutaneous QHS    insulin lispro  2-10 Units Subcutaneous TID AC    insulin lispro  5 Units Subcutaneous TID AC    iron sucrose  200 mg Intravenous Q24H [facility]    isosorbide dinitrate  20 mg Oral TID - Nitrate Free Interval    niacin  500 mg Oral BID    polyethylene glycol  17 g Oral Daily    senna-docusate  1 tablet Oral Q12H [facility]     Current Facility-Administered Medications   Medication Dose Route Frequency Last Rate    DOBUTamine  7.5 mcg/kg/min Intravenous Continuous 7.5 mcg/kg/min (12/26/22 1300)    heparin infusion 25,000 units/500 mL (VTE/Moderate Intensity)  18 Units/kg/hr Intravenous Continuous 18 Units/kg/hr (12/26/22 1317)     Current Facility-Administered Medications   Medication Dose Route    acetaminophen  650 mg Oral    dextrose  15 g of glucose Oral    Or    dextrose  12.5 g Intravenous    Or    dextrose  12.5 g Intravenous    Or    glucagon (rDNA)  1 mg Intramuscular    phenol  1 spray Oral         Labs:     Labs (last 72 hours):    Recent Labs   Lab 12/26/22  0409 12/25/22  1714   WBC 5.77 6.40   Hgb 10.0* 9.9*   Hematocrit 30.3* 30.1*   Platelets 65* 69*       Recent Labs   Lab 12/24/22  1112 12/23/22  1713   PT 16.3* 17.1*   PT INR 1.4* 1.5*   PTT 102* 33    Recent Labs   Lab 12/26/22  0409 12/25/22  1714   Sodium 135 134*   Potassium 3.8 3.8   Chloride 97* 97*   CO2 25 25   BUN 41.0* 40.0*   Creatinine 2.7* 2.6*   Calcium 9.1 9.3   Albumin 3.0* 3.1*  Protein, Total 6.1 6.2   Bilirubin, Total 1.7* 1.6*   Alkaline Phosphatase 72 72   ALT 174* 191*   AST (SGOT) 49* 55*   Glucose 284* 245*                   Microbiology, reviewed and are significant  for:  Microbiology Results (last 15 days)       Procedure Component Value Units Date/Time    MRSA culture [id number] Collected: 12/23/22 1200    Order Status: Completed Specimen: Culturette from Nasal Swab Updated: 12/24/22 1311     Culture MRSA Surveillance Negative for Methicillin Resistant Staph aureus    MRSA culture [id number] Collected: 12/23/22 1200    Order Status: Completed Specimen: Culturette from Throat Updated: 12/24/22 1311     Culture MRSA Surveillance Negative for Methicillin Resistant Staph aureus    Culture Blood Aerobic and Anaerobic [id number] Collected: 12/22/22 2239    Order Status: Completed Specimen: Blood, Venipuncture Updated: 12/26/22 [id number]    Narrative:      The order will result in two separate 8-76m bottles  Please do NOT order repeat blood cultures if one has been  drawn within the last 48 hours  UNLESS concerned for  endocarditis  AVOID BLOOD CULTURE DRAWS FROM CENTRAL LINE IF POSSIBLE  Indications:->Pneumonia  ORDER#: [id number]                                   ORDERED BY: GVear Clock SOURCE: Blood, Venipuncture                          COLLECTED:  12/22/22 22:39  ANTIBIOTICS AT COLL.:                                RECEIVED :  12/23/22 02:00  Culture Blood Aerobic and Anaerobic        PRELIM      12/26/22 02:29  12/24/22   No Growth after 1 day/s of incubation.  12/25/22   No Growth after 2 day/s of incubation.  12/26/22   No Growth after 3 day/s of incubation.      Culture Blood Aerobic and Anaerobic [[id number]: 12/22/22 2239    Order Status: Completed Specimen: Blood, Venipuncture Updated: 12/26/22 0229    Narrative:      The order will result in two separate 8-128mbottles  Please do NOT order repeat blood cultures if one has been  drawn within the last 48 hours  UNLESS concerned for  endocarditis  AVOID BLOOD CULTURE DRAWS FROM CENTRAL LINE IF POSSIBLE  Indications:->Pneumonia  ORDER#: [id number]                                  ORDERED BY: GAVear ClockSOURCE: Blood, Venipuncture                          COLLECTED:  12/22/22 22:39  ANTIBIOTICS AT COLL.:                                RECEIVED :  12/23/22 01:59  Culture Blood Aerobic and Anaerobic  PRELIM      12/26/22 02:29  12/24/22   No Growth after 1 day/s of incubation.  12/25/22   No Growth after 2 day/s of incubation.  12/26/22   No Growth after 3 day/s of incubation.      Respiratory Pathogen Panel w/COVID-19, PCR [id number]     Order Status: Canceled     COVID-19 (SARS-CoV-2) and Influenza A/B, NAA (Liat Rapid)- Admission [id number] Collected: 12/21/22 1050    Order Status: Completed Specimen: Culturette from Nasopharyngeal Updated: 12/21/22 1144     Purpose of COVID testing Diagnostic -PUI     SARS-CoV-2 Specimen Source Nasal Swab     SARS CoV 2 Overall Result Not Detected     Comment: __________________________________________________  -A result of "Detected" indicates POSITIVE for the    presence of SARS CoV-2 RNA  -A result of "Not Detected" indicates NEGATIVE for the    presence of SARS CoV-2 RNA  __________________________________________________________  Test performed using the Roche cobas Liat SARS-CoV-2 assay. This assay is  only for use under the Food and Drug Administration's Emergency Use  Authorization. This is a real-time RT-PCR assay for the qualitative  detection of SARS-CoV-2 RNA. Viral nucleic acids may persist in vivo,  independent of viability. Detection of viral nucleic acid does not imply the  presence of infectious virus, or that virus nucleic acid is the cause of  clinical symptoms. Negative results do not preclude SARS-CoV-2 infection and  should not be used as the sole basis for diagnosis, treatment or other  patient management decisions. Negative results must be combined with  clinical observations, patient history, and/or epidemiological information.  Invalid results may be due to inhibiting substances in the specimen and  recollection should occur. Please see  Fact Sheets for patients and providers  located:  https://www.benson-chung.com/          Influenza A Not Detected     Influenza B Not Detected     Comment: Test performed using the Roche cobas Liat SARS-CoV-2 & Influenza A/B assay.  This assay is only for use under the Food and Drug Administration's  Emergency Use Authorization. This is a multiplex real-time RT-PCR assay  intended for the simultaneous in vitro qualitative detection and  differentiation of SARS-CoV-2, influenza A, and influenza B virus RNA. Viral  nucleic acids may persist in vivo, independent of viability. Detection of  viral nucleic acid does not imply the presence of infectious virus, or that  virus nucleic acid is the cause of clinical symptoms. Negative results do  not preclude SARS-CoV-2, influenza A, and/or influenza B infection and  should not be used as the sole basis for diagnosis, treatment or other  patient management decisions. Negative results must be combined with  clinical observations, patient history, and/or epidemiological information.  Invalid results may be due to inhibiting substances in the specimen and  recollection should occur. Please see Fact Sheets for patients and providers  located: http://olson-hall.info/.         Narrative:      o Collect and clearly label specimen type:  o PREFERRED-Upper respiratory specimen: One Nasal Swab in  Transport Media.  o Hand deliver to laboratory ASAP  Diagnostic -PUI    Respiratory Pathogen Panel w/COVID-19, PCR [id number] Collected: 12/21/22 0106    Order Status: Canceled Specimen: Nasopharyngeal     Culture Blood Aerobic and Anaerobic [id number] Collected: 12/21/22 0021    Order Status: Completed Specimen: Blood, Venipuncture Updated: 12/26/22 [id number]    Narrative:  Indications:->Other  Other->infection  ORDER#: [id number]                                    ORDERED BY: LEE, SIEW MEI  SOURCE: Blood, Venipuncture RAC                      COLLECTED:   12/21/22 00:21  ANTIBIOTICS AT COLL.:                                RECEIVED :  12/21/22 04:06  Culture Blood Aerobic and Anaerobic        FINAL       12/26/22 06:37  12/26/22   No growth after 5 days of incubation.      Culture Blood Aerobic and Anaerobic [id number] Collected: 12/21/22 0021    Order Status: Completed Specimen: Blood, Venipuncture Updated: 12/26/22 1147    Narrative:      [id number] called Micro Results of Pos Bld. Results read back by:[id number], by [id number]  on 12/22/2022 at 07:29  Indications:->Other  Other->infection  ORDER#: [id number]                                    ORDERED BY: LEE, SIEW MEI  SOURCE: Blood, Venipuncture LAC                      COLLECTED:  12/21/22 00:21  ANTIBIOTICS AT COLL.:                                RECEIVED :  12/21/22 04:06  [id number] called Micro Results of Pos Bld. Results read back by:[id number], by [phone] on 12/22/2022 at 07:29  Culture Blood Aerobic and Anaerobic        FINAL       12/26/22 11:47   +  12/22/22   Aerobic Blood Culture Positive in 24 to 48 hours             Gram Stain Shows: Gram positive cocci in clusters  12/26/22   Anaerobic Blood Culture No Growth  12/23/22   Growth of Staphylococcus (coagulase negative)               Possible skin contaminant, susceptibility testing not             performed without request unless additional blood cultures,             collected within 48 hours of this culture, become positive.             Contact the laboratory for further information if necessary.        MRSA culture [id number] Collected: 12/21/22 0000    Order Status: Canceled Specimen: Culturette from Nasal Swab     MRSA culture [id number] Collected: 12/21/22 0000    Order Status: Canceled Specimen: Culturette from Throat                 Signed by: Dois Davenport, DO

## 2022-12-27 ENCOUNTER — Inpatient Hospital Stay: Payer: Commercial Managed Care - POS

## 2022-12-27 DIAGNOSIS — I081 Rheumatic disorders of both mitral and tricuspid valves: Secondary | ICD-10-CM

## 2022-12-27 DIAGNOSIS — D696 Thrombocytopenia, unspecified: Secondary | ICD-10-CM

## 2022-12-27 DIAGNOSIS — I10 Essential (primary) hypertension: Secondary | ICD-10-CM

## 2022-12-27 DIAGNOSIS — J449 Chronic obstructive pulmonary disease, unspecified: Secondary | ICD-10-CM

## 2022-12-27 DIAGNOSIS — E8770 Fluid overload, unspecified: Secondary | ICD-10-CM

## 2022-12-27 DIAGNOSIS — N184 Chronic kidney disease, stage 4 (severe): Secondary | ICD-10-CM

## 2022-12-27 DIAGNOSIS — E785 Hyperlipidemia, unspecified: Secondary | ICD-10-CM

## 2022-12-27 LAB — CBC AND DIFFERENTIAL
Absolute NRBC: 0.04 10*3/uL — ABNORMAL HIGH (ref 0.00–0.00)
Basophils Absolute Automated: 0.01 10*3/uL (ref 0.00–0.08)
Basophils Automated: 0.2 %
Eosinophils Absolute Automated: 0.07 10*3/uL (ref 0.00–0.44)
Eosinophils Automated: 1.1 %
Hematocrit: 29.4 % — ABNORMAL LOW (ref 37.6–49.6)
Hgb: 9.6 g/dL — ABNORMAL LOW (ref 12.5–17.1)
Immature Granulocytes Absolute: 0.08 10*3/uL — ABNORMAL HIGH (ref 0.00–0.07)
Immature Granulocytes: 1.3 %
Instrument Absolute Neutrophil Count: 3.89 10*3/uL (ref 1.10–6.33)
Lymphocytes Absolute Automated: 2.1 10*3/uL (ref 0.42–3.22)
Lymphocytes Automated: 32.9 %
MCH: 33.1 pg (ref 25.1–33.5)
MCHC: 32.7 g/dL (ref 31.5–35.8)
MCV: 101.4 fL — ABNORMAL HIGH (ref 78.0–96.0)
MPV: 12.3 fL (ref 8.9–12.5)
Monocytes Absolute Automated: 0.24 10*3/uL (ref 0.21–0.85)
Monocytes: 3.8 %
Neutrophils Absolute: 3.89 10*3/uL (ref 1.10–6.33)
Neutrophils: 60.7 %
Nucleated RBC: 0.6 /100 WBC — ABNORMAL HIGH (ref 0.0–0.0)
Platelets: 67 10*3/uL — ABNORMAL LOW (ref 142–346)
RBC: 2.9 10*6/uL — ABNORMAL LOW (ref 4.20–5.90)
RDW: 16 % — ABNORMAL HIGH (ref 11–15)
WBC: 6.39 10*3/uL (ref 3.10–9.50)

## 2022-12-27 LAB — BASIC METABOLIC PANEL
Anion Gap: 12 (ref 5.0–15.0)
Anion Gap: 13 (ref 5.0–15.0)
BUN: 51 mg/dL — ABNORMAL HIGH (ref 9.0–28.0)
BUN: 52 mg/dL — ABNORMAL HIGH (ref 9.0–28.0)
CO2: 25 mEq/L (ref 17–29)
CO2: 24 mEq/L (ref 17–29)
Calcium: 9 mg/dL (ref 7.9–10.2)
Calcium: 8.9 mg/dL (ref 7.9–10.2)
Chloride: 94 mEq/L — ABNORMAL LOW (ref 99–111)
Chloride: 95 mEq/L — ABNORMAL LOW (ref 99–111)
Creatinine: 3.5 mg/dL — ABNORMAL HIGH (ref 0.5–1.5)
Creatinine: 4.1 mg/dL — ABNORMAL HIGH (ref 0.5–1.5)
Glucose: 263 mg/dL — ABNORMAL HIGH (ref 70–100)
Glucose: 168 mg/dL — ABNORMAL HIGH (ref 70–100)
Potassium: 4.2 mEq/L (ref 3.5–5.3)
Potassium: 3.9 mEq/L (ref 3.5–5.3)
Sodium: 131 mEq/L — ABNORMAL LOW (ref 135–145)
Sodium: 132 mEq/L — ABNORMAL LOW (ref 135–145)
eGFR: 17.3 mL/min/{1.73_m2} — AB (ref >=60)
eGFR: 14.3 mL/min/{1.73_m2} — AB (ref >=60)

## 2022-12-27 LAB — HEPATIC FUNCTION PANEL (LFT)
ALT: 145 U/L — ABNORMAL HIGH (ref 0–55)
AST (SGOT): 43 U/L — ABNORMAL HIGH (ref 5–41)
Albumin/Globulin Ratio: 1 (ref 0.9–2.2)
Albumin: 3.1 g/dL — ABNORMAL LOW (ref 3.5–5.0)
Alkaline Phosphatase: 75 U/L (ref 37–117)
Bilirubin Direct: 0.8 mg/dL — ABNORMAL HIGH (ref 0.0–0.5)
Bilirubin Indirect: 0.9 mg/dL (ref 0.2–1.0)
Bilirubin, Total: 1.7 mg/dL — ABNORMAL HIGH (ref 0.2–1.2)
Globulin: 3.1 g/dL (ref 2.0–3.6)
Protein, Total: 6.2 g/dL (ref 6.0–8.3)

## 2022-12-27 LAB — MIXED VENOUS BLOOD GASES
Base Excess, Ven: 3.1 mEq/L (ref <=5.3)
HCO3, Ven: 26.8 mEq/L (ref 23.8–32.4)
O2 Sat, Venous: 63 % (ref 18.6–94.8)
Temperature: 37
Venous Total CO2: 28 mEq/L (ref 25.8–34.1)
pCO2, Venous: 39.2 mmhg (ref 39.0–63.0)
pH, Ven: 7.45 — ABNORMAL HIGH (ref 7.310–7.410)
pO2, Venous: 33.9 mmhg (ref 17.0–59.0)

## 2022-12-27 LAB — COOXIMETRY PROFILE
Carboxyhemoglobin: 2.2 % (ref 0.0–3.0)
Hematocrit Total Calculated: 28.9 % — ABNORMAL LOW (ref 40.0–54.0)
Hemoglobin Total: 9.3 g/dL — ABNORMAL LOW (ref 13.0–17.0)
Methemoglobin: 1.2 % (ref 0.0–3.0)
O2 Content: 8.8 Vol %
Oxygenated Hemoglobin: 66.9 % — ABNORMAL LOW (ref 85.0–98.0)

## 2022-12-27 LAB — LACTIC ACID
Lactic Acid: 1.5 mmol/L (ref 0.2–2.0)
Lactic Acid: 2.9 mmol/L — ABNORMAL HIGH (ref 0.2–2.0)
Lactic Acid: 1.5 mmol/L (ref 0.2–2.0)

## 2022-12-27 LAB — MAGNESIUM
Magnesium: 2.1 mg/dL (ref 1.6–2.6)
Magnesium: 2.2 mg/dL (ref 1.6–2.6)

## 2022-12-27 LAB — ANTI-XA,UFH: Anti-Xa, UFH: 0.09 IU/mL

## 2022-12-27 LAB — WHOLE BLOOD GLUCOSE POCT
Whole Blood Glucose POCT: 219 mg/dL — ABNORMAL HIGH (ref 70–100)
Whole Blood Glucose POCT: 200 mg/dL — ABNORMAL HIGH (ref 70–100)
Whole Blood Glucose POCT: 186 mg/dL — ABNORMAL HIGH (ref 70–100)
Whole Blood Glucose POCT: 133 mg/dL — ABNORMAL HIGH (ref 70–100)

## 2022-12-27 MED ORDER — INSULIN LISPRO 100 UNIT/ML SOLN (WRAP)
10.0000 [IU] | Freq: Three times a day (TID) | Status: DC
Start: 2022-12-27 — End: 2023-01-04
  Administered 2022-12-27 – 2023-01-04 (×23): 10 [IU] via SUBCUTANEOUS
  Filled 2022-12-27 (×23): qty 30

## 2022-12-27 MED ORDER — SENNOSIDES-DOCUSATE SODIUM 8.6-50 MG PO TABS
1.0000 | ORAL_TABLET | Freq: Two times a day (BID) | ORAL | Status: DC
Start: 2022-12-27 — End: 2023-01-04
  Administered 2022-12-29 – 2023-01-04 (×10): 1 via ORAL
  Filled 2022-12-27 (×12): qty 1

## 2022-12-27 MED ORDER — INSULIN GLARGINE 100 UNIT/ML SC SOLN
33.0000 [IU] | Freq: Two times a day (BID) | SUBCUTANEOUS | Status: DC
Start: 2022-12-27 — End: 2023-01-03
  Administered 2022-12-27 – 2023-01-03 (×14): 33 [IU] via SUBCUTANEOUS
  Filled 2022-12-27 (×13): qty 33

## 2022-12-27 MED ORDER — CHLOROTHIAZIDE SODIUM 500 MG IV SOLR
500.0000 mg | Freq: Once | INTRAVENOUS | Status: AC
Start: 2022-12-28 — End: 2022-12-27
  Administered 2022-12-27: 500 mg via INTRAVENOUS
  Filled 2022-12-27: qty 500

## 2022-12-27 MED ORDER — INSULIN GLARGINE 100 UNIT/ML SC SOLN
15.0000 [IU] | Freq: Two times a day (BID) | SUBCUTANEOUS | Status: DC
Start: 2022-12-27 — End: 2023-01-03
  Administered 2022-12-27: 15 [IU] via SUBCUTANEOUS
  Filled 2022-12-27 (×2): qty 15

## 2022-12-27 MED ORDER — FUROSEMIDE INFUSION 2 MG/ML (SIMPLE)
40.0000 mg/h | Status: DC
Start: 2022-12-27 — End: 2022-12-28
  Administered 2022-12-27: 20 mg/h via INTRAVENOUS
  Administered 2022-12-28: 40 mg/h via INTRAVENOUS
  Filled 2022-12-27 (×2): qty 100

## 2022-12-27 MED ORDER — HEPARIN SODIUM (PORCINE) 5000 UNIT/ML IJ SOLN
5000.0000 [IU] | Freq: Three times a day (TID) | INTRAMUSCULAR | Status: DC
Start: 2022-12-27 — End: 2023-01-04
  Administered 2022-12-27 – 2023-01-04 (×24): 5000 [IU] via SUBCUTANEOUS
  Filled 2022-12-27 (×25): qty 1

## 2022-12-27 MED ORDER — FUROSEMIDE 10 MG/ML IJ SOLN
120.0000 mg | Freq: Once | INTRAMUSCULAR | Status: AC
Start: 2022-12-27 — End: 2022-12-27
  Administered 2022-12-27: 120 mg via INTRAVENOUS
  Filled 2022-12-27: qty 12

## 2022-12-27 NOTE — Progress Notes (Signed)
Cardiac Intensive Care Unit (CICU)  Progress Note      Patient Name: [name] [name]  MRN: [id number]  Room: FI103/FI103-01    ICU Day: 1h    Attending Attestation:   {Tip  This section/box is for the attending addendum to a resident or APP note. Do NOT delete unless attending is the only author on this note.:[id number]::"***"}    As the physician signing this note, I have reviewed the history and exam and pertinent imaging results. My addendum revises, addends or confirms the recommendations or plans in this note. I discussed with the plan of care with the APP/Resident and I agree with the findings noted unless revised or updated in my notations. I formulated all of the medical decision making noted in the assessment & plan.    {Attending Billing/Split Share Statements:63183}     Interval Events   24 hour events: Increasing dobutamine requirements with worsening renal function and elevated LA. Upgraded to CICU, PAC placed. Lasix drip started.     Subjective   Hospital Course:  3/04: Presented to [facility] with SVT, failed cardioversion, NSR after adenosine. Stared on dobutamine. RHC done.   3/05: Diuresing with good UOP. Aspiration event.  3/06: Started on Zosyn and Vanco, blood cultures positive. RML infiltrate. CI 1.7, LA cleared.  3/07: CI continues to improve. Antibiotics discontinued.   3/08: Diuresed overnight. PAC removed. Hohn catheter placed for inotrope.   3/09: Dobutamine increased to 7.5 mcg/kg/min with LA 2.7. Hydralazine increased to 100 mg TID.   3/10: Upgraded to CICU with worsening renal function, elevated LA 2.9 concerning for cardiogenic shock. Dobutamine to 10 mcg/kg/min, lasix drip started at 20 mg/hour. PAC placed at the bedside.      Assessment & Plan   Assessment and Plan   Summary: [name] a 77 y.o. 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    Current PT Order: Yes  Current OT Order: Yes ***  {cicuNeuro:63182}   CARDIOVASCULAR:   Cardiogenic shock, EF 10%  Acute on chronic systolic heart failure, EF 20% s/p Medtronic BiV ICD  Narrow complex SVT, failed cardioversion 3/4  Hx ICM  Hx CAD s/p PCI/DES (2011)   Hx HTN  Hx HLD    Ejection Fraction          09/08/2022    11:19 12/21/2022    16:01   Ejection Fraction   Ejection Fraction 20.614  10.5     ***  Blood Pressure Goal: ***   PULMONARY:   Hx COPD    Last vent settings if on vent:     Ventilator Time (if on vent):     ***  Supplemental oxygen as needed to keep sat > ***%   RENAL:   Acute kidney injury on CKD stage IV  Hyponatremia       Intake/Output Summary (Last 24 hours) at 12/27/2022 2351  Last data filed at 12/27/2022 2100  Gross per 24 hour   Intake 1157.85 ml   Output 525 ml   Net 632.85 ml    ***   GASTROINTESTINAL:  ***    Last Bowel Movement:   Last BM Date: 12/27/22 ***  Nutrition: ***  Bowel Regimen: ***     INFECTIOUS DISEASE:   ***    Temp (24hrs), Avg:97.9  F (36.6 C), Min:97.7 F (36.5 C), Max:98.3 F (36.8 C)     * No values recorded between 12/25/2022  2:07 PM and 12/27/2022 11:51 PM *  Lab Results   Component Value Date    WBC 6.39 12/27/2022      CAUTI Prevention (n/a if blank):  Foley Day:         Blood Steam Infection Prevention (n/a if blank):   Implanted Catheter 12/25/22 Hohn Tunneled Right Internal jugular ***  Last culture dates: ***          HAI Prevention Plan  {HAI Prevention KN:[id number]       HEME/ONC:   Stage IV CLL, dx 06/2020  Thrombocytopenia likely from acute illness/cardiogenic shock   Hx RLE 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     HIT negative   SCDs  DVT chemoprophylaxis: heparin (porcine) injection 5,000 Units    ENDO/RHEUM:  DM, type 2 ***  Goal glucose range 100 - 180       Current Infusions  (n/a if blank):   Marland Kitchen DOBUTamine 10 mcg/kg/min (12/27/22 2218)   . furosemide (LASIX) infusion 2 mg/mL 20 mg/hr (12/27/22 2055)            Advance Care Planning: Open ACP Navigator  Code Status: Full Code  Next of Kin:  Primary Emergency Contact: Butikofer,Octavia  {Vanishing Tip This patient does not have an Advance Care Planning Note. If you have completed a goals of care discussion or advanced care planning meeting please use the ACPNOTE SmartBlock to document. [id number]         ADVANCE CARE PLAN             {  Vanishing tip  If showing above, you can right-click on "ADVANCE CARE PLAN" and click on 'edit smartblock' (or click on ACPNOTE box on top) to document an advance care planning discussion if it was completed. If not, this text will disappear. ACP time should be added to your critical care time:65284}        Objective      Examination:   Temp:  [97.7 F (36.5 C)-98.3 F (36.8 C)] 97.9 F (36.6 C)  Heart Rate:  [78-91] 88  Resp Rate:  [17-50] 50  BP: (101-114)/(50-61) 114/61  Arterial Line BP: (-21)/(-21) -21/-21  Neuro:    {CICUNeuro:63180}    Lungs:   {:315033}     Cardiac:   {:315510}     Abdomen:    {:315920}     Extremities:   {CICUExtremities:63181}    Skin:     {CICUSkin:63192}   Fick/Cardiac Hemodynamics   VO2 Determination  Height: 182.9 cm (6')  Weight FICK (only): 93.1 kg (205 lb 4 oz)  BSA (Fick only) (Calculated - sq m): 2.17 sq meters  VO2: 271.25    CI (L/min/m2): 2.2 L/min/m2    CO/CI Determination  SaO2 : 0.97  SvO2 : 0.63  SaO2 - SvO2: 0.34  HGB Result: 11.1  x 1.36 x 10: 51.33  Cardiac Output (FICK): 5.28  Cardiac Index (FICK): 2.43  Cardiac Power Output: 0.94    PVR Determination  PAP: -17/-17  PAP (Mean): 32 mmHg  PAWP (mmHg): 24 mmHg  Pulmonary Artery Pulsatility Index (PAPI): 0    SVR Determination  MAP (mmHg): 81  CVP (mmHg): 148 mmHg  MAP - CVP: -67  SVR (dynes/second/cm3): 984.85 dynes/second/cm3  Most Recent Labs  6.39 \ 9.6* / 67*    / 29.4* \    03/10 0410    132* 95*  52.0* / 168*   3.9 24 4.1* \    03/10 1706              Additional Diagnoses:   {Vanishing Tip  Patient meets BMI criteria for a weight-based diagnosis. 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 [id number]  Patient has BMI=Body mass index is 28.7 kg/m.  Diagnosis: {BMI High:57938} based on BMI criteria    {Vanishing Tip  Platelet value is low  100k-149k = Mild Thrombocytopenia  50k-99k = Moderate Thrombocytopenia  <50k = Severe Thrombocytopenia :QF:508355  Recent Labs     12/27/22  0410 12/26/22  0409 12/25/22  1714 12/25/22  0328   Platelets 67* 65* 69* 71*     {Diagnosis:30448747}        {Vanishing Tip  Sodium value is low  130-134 mEq/L = Mild Hyponatremia  120-129 mEq/L = Moderate Hyponatremia  <120 mEq/L = Severe Hyponatremia :QF:508355  Recent Labs     12/27/22  1706 12/27/22  0410 12/26/22  1705 12/26/22  0409 12/25/22  1714 12/25/22  0328   Sodium 132* 131* 134* 135 134* 134*     {Diagnosis:30448748}         {Vanishing Tip Patient has a Hgb level below the normal lab reference range at [facility].  [facility] Reference Values    Male Hgb <11.3 is abnormal   Male      Hgb < 12.5 is abnormal. :QF:508355  Recent Labs   Lab 12/27/22  0410 12/26/22  0409 12/25/22  1714   Hgb 9.6* 10.0* 9.9*   Hematocrit 29.4* 30.3* 30.1*   MCV 101.4* 99.7* 99.0*   WBC 6.39 5.77 6.40   Platelets 67* 65* 69*       Recent Labs   Lab 12/24/22  1513 12/24/22  0912 12/22/22  1430 12/21/22  2247   Ferritin  --   --  57.00  --    Iron  --   --   --  32*   TIBC  --   --   --  307   Iron Saturation  --   --   --  10*   Folate 12.6  --   --   --    Haptoglobin  --  165  --   --      {Anemia Diagnosis:30464406}                 {Billing Statements (First Note):63132:::0}

## 2022-12-27 NOTE — Plan of Care (Signed)
Problem: Hemodynamic Status: Cardiac  Goal: Stable vital signs and fluid balance  Outcome: Progressing  Flowsheets (Taken 12/27/2022 1836)  Stable vital signs and fluid balance:   Assess signs and symptoms associated with cardiac rhythm changes   Monitor lab values     Problem: Inadequate Gas Exchange  Goal: Adequate oxygenation and improved ventilation  Outcome: Progressing  Flowsheets (Taken 12/27/2022 1836)  Adequate oxygenation and improved ventilation:   Assess lung sounds   Monitor SpO2 and treat as needed   Provide mechanical and oxygen support to facilitate gas exchange   Position for maximum ventilatory efficiency   Teach/reinforce use of incentive spirometer 10 times per hour while awake, cough and deep breath as needed   Plan activities to conserve energy: plan rest periods   Increase activity as tolerated/progressive mobility     Problem: Pain interferes with ability to perform ADL  Goal: Pain at adequate level as identified by patient  Outcome: Progressing  Flowsheets (Taken 12/27/2022 1836)  Pain at adequate level as identified by patient:   Identify patient comfort function goal   Assess for risk of opioid induced respiratory depression, including snoring/sleep apnea. Alert healthcare team of risk factors identified.   Assess pain on admission, during daily assessment and/or before any "as needed" intervention(s)   Evaluate if patient comfort function goal is met   Reassess pain within 30-60 minutes of any procedure/intervention, per Pain Assessment, Intervention, Reassessment (AIR) Cycle

## 2022-12-27 NOTE — Progress Notes (Signed)
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  He was admitted at the ICU there for further management of heart failure and SVT then transferred to [place] for cardiogenic shock and LVAD consideration.  He was started on dobutamine and diuresed with Lasix drip.  His renal function initially improved, however today has worsening AKI despite adequate diuresis and dobutamine.  Plan for right heart cath tomorrow.    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  - Continue afterload reduction: Hydralazine, isosorbide dinitrate  - Inotrope therapy: Dobutamine drip via Hohn catheter-currently being evaluated for LVAD  - GDMT: On hold while actively diuresing and renal function improved  - Given worsening AKI, n.p.o. at midnight for right heart cath tomorrow    #Cardiorenal AKI on CKD stage 4-baseline creatinine around 2.5 -significant worsening of creatinine today  #Non-anion gap metabolic acidosis secondary to CKD  - Hold Lasix drip  - BMP, mg twice daily  - [facility] following    #Insulin-dependent type 2 diabetes-A1c of 7.7  - increase Lantus 33 units twice daily, high-dose sliding scale insulin, increase lispro 10units 3 times daily with meals    #History of right common femoral vein DVT-had a repeat Doppler in 04/2022 that shows resolution of DVT.  No need for further anticoagulation per hematology.  - Holding home  Eliquis, does not need this on discharge.    #SVT  - P.o. amiodarone at loading dose, once this is done should convert to maintenance dose    #Iron deficiency anemia  - IV Venofer    #Situational anxiety versus depression  - Psychiatry to see patient on 3/11    #Acute hypoxic respiratory failure secondary to HFrEF exacerbation-resolved    #Moderate thrombocytopenia-suspect secondary to acute illness, consumptive process  - Platelets around 60-80  - VCS following    #CAD  - Continue statin, aspirin    #History of CLL  - Hold acalabrutinib per hematology recommendations      Dvt ppx: Subcu heparin on hold for right heart cath    Case discussed with: Patient, bedside RN      Additional Diagnoses:               Lines:     Patient Lines/Drains/Airways Status       Active PICC Line / CVC Line / PIV Line / Drain / Airway / Intraosseous Line / Epidural Line / ART Line / Line / Wound / Pressure Ulcer / NG/OG Tube       Name Placement date Placement time Site Days    Implanted Catheter 12/25/22 Hohn Tunneled Right Internal jugular 12/25/22  1500  Internal jugular  less than 1    Peripheral IV 12/21/22 18 G Left Antecubital 12/21/22  2200  Antecubital  4    Peripheral IV 12/26/22 22 G Anterior;Left Forearm 12/26/22  1143  Forearm  less than 1    External Urinary Catheter 12/24/22  2000  --  1                       Subjective     CC: Heart failure    HPI/Subjective: No shortness of breath, no pain at home catheter site, some bleeding overnight     Review of Systems:     As per HPI    Physical Exam:     VITAL SIGNS PHYSICAL EXAM   Temp:  [97.7 F (36.5 C)-98.3 F (36.8 C)] 97.7 F (36.5 C)  Heart Rate:  [83-92] 85  Resp Rate:  [17-19] 18  BP: (101-120)/(53-66) 101/56  Blood Glucose:        Intake/Output Summary (Last 24 hours) at 12/27/2022 1513  Last data filed at 12/27/2022 1200  Gross per 24 hour   Intake 1192.6 ml   Output 575 ml   Net 617.6 ml    Physical Exam  General: awake, alert X O x3  Cardiovascular: regular rate and  rhythm, no murmurs, rubs or gallops  Lungs: clear to auscultation bilaterally, without wheezing, rhonchi, or rales  Abdomen: soft, non-tender, non-distended;   Extremities: no edema         Meds:     Medications were reviewed:  Current Facility-Administered Medications   Medication Dose Route Frequency    amiodarone  400 mg Oral TID    aspirin EC  81 mg Oral Daily    atorvastatin  40 mg Oral QHS    heparin (porcine)  5,000 Units Subcutaneous Q8H [place]    hydrALAZINE  100 mg Oral Q8H [place]    insulin glargine  33 Units Subcutaneous Q12H    Or    insulin glargine  15 Units Subcutaneous Q12H    insulin lispro  1-6 Units Subcutaneous QHS    insulin lispro  10 Units Subcutaneous TID AC    insulin lispro  2-10 Units Subcutaneous TID AC    isosorbide dinitrate  20 mg Oral TID - Nitrate Free Interval    niacin  500 mg Oral BID    polyethylene glycol  17 g Oral Daily    senna-docusate  1 tablet Oral Q12H [facility]     Current Facility-Administered Medications   Medication Dose Route Frequency Last Rate    DOBUTamine  7.5 mcg/kg/min Intravenous Continuous 7.5 mcg/kg/min (12/27/22 1200)     Current Facility-Administered Medications   Medication Dose Route    acetaminophen  650 mg Oral    dextrose  15 g of glucose Oral    Or    dextrose  12.5 g Intravenous    Or    dextrose  12.5 g Intravenous    Or    glucagon (rDNA)  1 mg Intramuscular    phenol  1 spray Oral         Labs:     Labs (last 72 hours):    Recent Labs   Lab 12/27/22  0410 12/26/22  0409   WBC 6.39 5.77   Hgb 9.6* 10.0*   Hematocrit 29.4* 30.3*   Platelets 67* 65*       Recent Labs   Lab 12/24/22  1112 12/23/22  1713   PT 16.3* 17.1*   PT INR 1.4* 1.5*   PTT 102* 33    Recent Labs   Lab 12/27/22  0410 12/26/22  1705 12/26/22  0409   Sodium 131* 134* 135   Potassium 4.2 4.0 3.8   Chloride 94* 95* 97*   CO2 25  26 25   BUN 51.0* 44.0* 41.0*   Creatinine 3.5* 3.1* 2.7*   Calcium 9.0 9.1 9.1   Albumin 3.1*  --  3.0*   Protein, Total 6.2  --  6.1   Bilirubin, Total 1.7*  --  1.7*    Alkaline Phosphatase 75  --  72   ALT 145*  --  174*   AST (SGOT) 43*  --  49*   Glucose 263* 272* 284*                   Microbiology, reviewed and are significant for:  Microbiology Results (last 15 days)       Procedure Component Value Units Date/Time    MRSA culture [id number] Collected: 12/23/22 1200    Order Status: Completed Specimen: Culturette from Nasal Swab Updated: 12/24/22 1311     Culture MRSA Surveillance Negative for Methicillin Resistant Staph aureus    MRSA culture [id number] Collected: 12/23/22 1200    Order Status: Completed Specimen: Culturette from Throat Updated: 12/24/22 1311     Culture MRSA Surveillance Negative for Methicillin Resistant Staph aureus    Culture Blood Aerobic and Anaerobic [id number] Collected: 12/22/22 2239    Order Status: Completed Specimen: Blood, Venipuncture Updated: 12/27/22 0331    Narrative:      The order will result in two separate 8-27m bottles  Please do NOT order repeat blood cultures if one has been  drawn within the last 48 hours  UNLESS concerned for  endocarditis  AVOID BLOOD CULTURE DRAWS FROM CENTRAL LINE IF POSSIBLE  Indications:->Pneumonia  ORDER#: [id number]                                   ORDERED BY: GVear Clock SOURCE: Blood, Venipuncture                          COLLECTED:  12/22/22 22:39  ANTIBIOTICS AT COLL.:                                RECEIVED :  12/23/22 02:00  Culture Blood Aerobic and Anaerobic        PRELIM      12/27/22 03:30  12/24/22   No Growth after 1 day/s of incubation.  12/25/22   No Growth after 2 day/s of incubation.  12/26/22   No Growth after 3 day/s of incubation.  12/27/22   No Growth after 4 day/s of incubation.      Culture Blood Aerobic and Anaerobic [[id number]: 12/22/22 2239    Order Status: Completed Specimen: Blood, Venipuncture Updated: 12/27/22 0331    Narrative:      The order will result in two separate 8-182mbottles  Please do NOT order repeat blood cultures if one has been  drawn within  the last 48 hours  UNLESS concerned for  endocarditis  AVOID BLOOD CULTURE DRAWS FROM CENTRAL LINE IF POSSIBLE  Indications:->Pneumonia  ORDER#: [id number]                                  ORDERED BY: GAVear ClockSOURCE: Blood, Venipuncture  COLLECTED:  12/22/22 22:39  ANTIBIOTICS AT COLL.:                                RECEIVED :  12/23/22 01:59  Culture Blood Aerobic and Anaerobic        PRELIM      12/27/22 03:30  12/24/22   No Growth after 1 day/s of incubation.  12/25/22   No Growth after 2 day/s of incubation.  12/26/22   No Growth after 3 day/s of incubation.  12/27/22   No Growth after 4 day/s of incubation.      Respiratory Pathogen Panel w/COVID-19, PCR [id number]     Order Status: Canceled     COVID-19 (SARS-CoV-2) and Influenza A/B, NAA (Liat Rapid)- Admission [id number] Collected: 12/21/22 1050    Order Status: Completed Specimen: Culturette from Nasopharyngeal Updated: 12/21/22 1144     Purpose of COVID testing Diagnostic -PUI     SARS-CoV-2 Specimen Source Nasal Swab     SARS CoV 2 Overall Result Not Detected     Comment: __________________________________________________  -A result of "Detected" indicates POSITIVE for the    presence of SARS CoV-2 RNA  -A result of "Not Detected" indicates NEGATIVE for the    presence of SARS CoV-2 RNA  __________________________________________________________  Test performed using the Roche cobas Liat SARS-CoV-2 assay. This assay is  only for use under the Food and Drug Administration's Emergency Use  Authorization. This is a real-time RT-PCR assay for the qualitative  detection of SARS-CoV-2 RNA. Viral nucleic acids may persist in vivo,  independent of viability. Detection of viral nucleic acid does not imply the  presence of infectious virus, or that virus nucleic acid is the cause of  clinical symptoms. Negative results do not preclude SARS-CoV-2 infection and  should not be used as the sole basis for diagnosis, treatment or  other  patient management decisions. Negative results must be combined with  clinical observations, patient history, and/or epidemiological information.  Invalid results may be due to inhibiting substances in the specimen and  recollection should occur. Please see Fact Sheets for patients and providers  located:  https://www.benson-chung.com/          Influenza A Not Detected     Influenza B Not Detected     Comment: Test performed using the Roche cobas Liat SARS-CoV-2 & Influenza A/B assay.  This assay is only for use under the Food and Drug Administration's  Emergency Use Authorization. This is a multiplex real-time RT-PCR assay  intended for the simultaneous in vitro qualitative detection and  differentiation of SARS-CoV-2, influenza A, and influenza B virus RNA. Viral  nucleic acids may persist in vivo, independent of viability. Detection of  viral nucleic acid does not imply the presence of infectious virus, or that  virus nucleic acid is the cause of clinical symptoms. Negative results do  not preclude SARS-CoV-2, influenza A, and/or influenza B infection and  should not be used as the sole basis for diagnosis, treatment or other  patient management decisions. Negative results must be combined with  clinical observations, patient history, and/or epidemiological information.  Invalid results may be due to inhibiting substances in the specimen and  recollection should occur. Please see Fact Sheets for patients and providers  located: http://olson-hall.info/.         Narrative:      o Collect and clearly label specimen type:  o PREFERRED-Upper respiratory specimen: One Nasal  Swab in  Transport Media.  o Hand deliver to laboratory ASAP  Diagnostic -PUI    Respiratory Pathogen Panel w/COVID-19, PCR [id number] Collected: 12/21/22 0106    Order Status: Canceled Specimen: Nasopharyngeal     Culture Blood Aerobic and Anaerobic [id number] Collected: 12/21/22 0021    Order Status:  Completed Specimen: Blood, Venipuncture Updated: 12/26/22 [id number]    Narrative:      Indications:->Other  Other->infection  ORDER#: [id number]                                    ORDERED BY: LEE, SIEW MEI  SOURCE: Blood, Venipuncture RAC                      COLLECTED:  12/21/22 00:21  ANTIBIOTICS AT COLL.:                                RECEIVED :  12/21/22 04:06  Culture Blood Aerobic and Anaerobic        FINAL       12/26/22 06:37  12/26/22   No growth after 5 days of incubation.      Culture Blood Aerobic and Anaerobic [id number] Collected: 12/21/22 0021    Order Status: Completed Specimen: Blood, Venipuncture Updated: 12/26/22 1147    Narrative:      [id number] called Micro Results of Pos Bld. Results read back [id number], by [id number]  on 12/22/2022 at 07:29  Indications:->Other  Other->infection  ORDER#: [id number]                                    ORDERED BY: LEE, SIEW MEI  SOURCE: Blood, Venipuncture LAC                      COLLECTED:  12/21/22 00:21  ANTIBIOTICS AT COLL.:                                RECEIVED :  12/21/22 04:06  [id number] called Micro Results of Pos Bld. Results read back by:[id number], by [phone] on 12/22/2022 at 07:29  Culture Blood Aerobic and Anaerobic        FINAL       12/26/22 11:47   +  12/22/22   Aerobic Blood Culture Positive in 24 to 48 hours             Gram Stain Shows: Gram positive cocci in clusters  12/26/22   Anaerobic Blood Culture No Growth  12/23/22   Growth of Staphylococcus (coagulase negative)               Possible skin contaminant, susceptibility testing not             performed without request unless additional blood cultures,             collected within 48 hours of this culture, become positive.             Contact the laboratory for further information if necessary.        MRSA culture [id number] Collected: 12/21/22 0000    Order Status: Canceled Specimen: Culturette from Nasal Swab     MRSA culture [id number] Collected: 12/21/22 0000  Order Status: Canceled Specimen:  Culturette from Throat                 Signed by: Dois Davenport, DO

## 2022-12-27 NOTE — Progress Notes (Signed)
Advanced Heart Failure and Transplant Progress Note    [employer] (NEW NAME): '[facility] Advanced Heart Failure Inpatient Team'      Advanced Heart Failure Cardiologist: Dr. Judeth Horn    Listed for transplant:  No due to active CLL    Assessment:   Heart failure-cardiogenic shock, SCAI stage C  Acute on chronic decompensated heart failure, LVEF less than 15%  Ischemic cardiomyopathy, NYHA class IV, ACC/AHA stage D, currently undergoing DT LVAD evaluation  RHC 09/09/22: RA 7, RV 39/4, PA 41/21/29, PCWP 14, PA sat 62%, RA sat 59%, AO sat 95%, Fick CO/CI 4.0/2.0, TD CO/CI 3.5/1.7, MAP 89, PAPi 3, PVR 3.75-4.3 WU  TTE 12/21/22: Severely reduced LV function with EF 11%, LVIDD 6.8 cm, global hypokinesis, mildly dilated RV with moderately decreased RV systolic function, severe functional MR, severe tricuspid regurgitation with RVSP 51 mmHg  RHC (12/22/22): RA 15, RV 48/1. 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 At this moment from a CLL standpoint, he is quite stable.  Right lower extremity DVT (5/23)   Type 2 diabetes with an A1c 5.9%  Stable COPD  Multiple hospital admissions for acute decompensated heart failure in 2023  DM2  Thrombocytopenia  Iron deficit anemia   Iron studies (3/6): Iron sat 10, Ferritin 57  Depression   Consult to psych   Aspiration event  Aspirated on  12/22/22, passed swallow eval thereafter   Recommendations   Heart failure-cardiogenic shock, SCAI stage C  Acute on chronic decompensated heart failure, LVEF less than 15%  Heart Failure Medical Therapy  BB:  Hold in setting of cardiogenic shock   ACE/ARB/ARNI: Hold in setting of worsening renal function   SGLT2i:  Hold for possible procedures, and reduced renal function    MRA: Hold in setting of worsening renal function.  Previously has had issues with hyperkalemia  Diuresis: : Holding due to worsening renal function   Inotrope: Dobutamine 7.5 mcg/kg/min  Afterload: Hydralazine 100 mg TID, Isordil 20 mg TID         Worsening AKI despite uptitration of dobutamine, plan for repeat RHC Monday 3/11, NPO at midnight & hold heparin the day of procedure     Iron Deficiency Anemia   Ferritin 57, Iron Sat 10   Continue IV Venofer 200 mg x 5 days (last day 12/28/22)    Thrombocytopenia   HIT Ab negative, SRA negative   Seen by oncology (12/26/22): Marland Kitchen Suspect likely due to acute illness with cardiogenic shock and AKI necessitating inotropic support as above, less likely immune thrombocytopenia in the setting of CLL although unlikely given overall good control of disease. Platelet count is stable and safe >50K in the absence of bleeding at this time, however should he necessitate a procedure requiring higher PLT goal would consider trial of steroids for possible ITP.    Right lower  extremity DVT  Diagnoseed 02/2022, likely provoked in the setting of cancer  Was on apixaban at home, heparin gtt while hospitalized; currently on hold due to bleed from line    Advanced Therapies Evaluation  Plan for LVAD education on Tuesday 3/12 at [facility]; will present at committee meeting on [place] 3/13  Psych consulted for depression  Social work assist ment of caregiver support   Awaiting improvement in renal function     I personally saw and examined the patient and spent 25 minutes in counseling, coordination and care. The patient was also seen by  Dr. Eliezer Bottom. The assessment and plan were discussed and formulated together.     Amika McBurnie, PA-C  Advanced Heart Failure/ VAD/ Cardiac Transplant Team  [phone]     --------------------------------------    I performed the substantive portion of this visit by providing more than 50% of the total time. I personally saw and examined the patient and spent (30) minutes. The patient was also seen by the APP and I reviewed the note and updated the documented findings and plan of care accordingly.    Repeat lactate 2.7 despite increasing dobutamine to 10 and maximizing hydralazine. Cr uptrending as well to 4.1. Restarted diuretics/lasix infusion. Discussed with CICU team. Transfer to CICU for nipride +/- IABP. Planned for SW assistance with support, repeat VAD education Tuesday, and hope for kidneys to improve with adequate support. Will need to decide if he is an [place] 5.5 bridge to renal function improvement bridge to DT-LVAD, vs hospice, this admission.     Antonieta Iba, MD   Advanced Heart Failure and Transplant Cardiology  Cardiac Intensive Care Unit  [facility]       Subjective/24 hour events:   - Patient with continued bleeding from line, ear and nose due to thrombocytopenia; heparin gtt d/ced  - Lactic acid normalized 2.7--->1.3  - Net even UO 1.1L, holding lasix gtt due to AKI  - Patient denies chest pain, shortness of breath, lightheadedness, dizziness, palpitations.   Objective:   Vital Signs in last 24 hours:  Temp:  [97.9 F (36.6 C)-98.7 F (37.1 C)] 98 F (36.7 C)  Heart Rate:  [83-92] 91  Resp Rate:  [17-19] 18  BP: (103-120)/(55-66) 109/58  SpO2: 97 %  O2 Device: None (Room air)    Vent Settings  FiO2: (S) 40 %    Telemetry: A-paced 85     Hemodynamics:  PAP: 55/18  PAP (Mean): 31 mmHg  CVP (mmHg): 11 mmHg     CO (L/min): 4.8 L/min  CI (L/min/m2): 2.2 L/min/m2  Cardiac Output (FICK): 5.28  Cardiac Index (FICK): 2.43  SVR (dyne*sec)/cm5: 1124 (dyne*sec)/cm5  SV (mL): 62  mL      Intake / Output:    Intake/Output Summary (Last 24 hours) at 12/27/2022 0900  Last data filed at 12/27/2022 0500  Gross per 24 hour   Intake 867 ml   Output 1125 ml   Net -258 ml          Weight:      12/26/2022     4:01 AM 12/27/2022     4:11 AM 12/27/2022     6:00 AM   Weight Monitoring   Weight 89.54 kg 92.4 kg 92.534 kg   Weight Method Bed Scale Bed Scale    BMI (calculated) 26.8 kg/m2 27.6 kg/m2 27.7 kg/m2        Weight change: 2.86 kg (6 lb 4.9 oz)  Physical Exam:   General: well-developed, alert, NAD  HEENT: conjunctiva pale, buccal mucosa moist, anicteric sclera, no erythema, JVP not elevated   Lungs: Diminished bibasilar breath sounds, no rhonchi, wheezes or crackles with normal rate and effort  Cardiovascular: S1, S2, normal rate, regular rhythm, no m/r/g   Abdominal: soft, NT/ND, +B.S., no HSM  Extremities: warm to touch, no cyanosis, no lower extremity edema, peripheral pulses palpable bilaterally  Neuromuscular exam: alert and oriented x 3, speech fluent   Skin: Pressure dressing over right chest, no rashes visualized on exposed skin     LDAs:  Patient Lines/Drains/Airways Status       Active Lines, Drains and Airways       Name Placement date Placement time Site Days    Implanted Catheter 12/25/22 [place]ohn Tunneled Right Internal jugular 12/25/22  1500  Internal jugular  1    Peripheral IV 12/21/22 18 G Left Antecubital 12/21/22  2200  Antecubital  5    Peripheral IV 12/26/22 22 G Anterior;Left Forearm 12/26/22  1143  Forearm  less than 1    External Urinary Catheter 12/24/22  2000  --  2                      Current Meds:  amiodarone, 400 mg, Oral, TID  aspirin EC, 81 mg, Oral, Daily  atorvastatin, 40 mg, Oral, QHS  heparin (porcine), 5,000 Units, Subcutaneous, Q8H SCH  hydrALAZINE, 100 mg, Oral, Q8H [place]  insulin glargine, 33 Units, Subcutaneous, Q12H   Or  insulin glargine, 15 Units, Subcutaneous, Q12H  insulin lispro, 1-6 Units, Subcutaneous, QHS  insulin lispro, 10 Units, Subcutaneous, TID  AC  insulin lispro, 2-10 Units, Subcutaneous, TID AC  iron sucrose, 200 mg, Intravenous, Q24H SCH  isosorbide dinitrate, 20 mg, Oral, TID - Nitrate Free Interval  niacin, 500 mg, Oral, BID  polyethylene glycol, 17 g, Oral, Daily  senna-docusate, 1 tablet, Oral, Q12H [facility]         Drips:   DOBUTamine 7.5 mcg/kg/min (12/26/22 2235)         Labs:  CBC        12/27/22  0410 12/26/22  0409 12/25/22  1714   WBC 6.39 5.77 6.40   Hgb 9.6* 10.0* 9.9*   Hematocrit 29.4* 30.3* 30.1*   Platelets 67* 65* 69*       BMP        12/27/22  0410 12/26/22  1802 12/26/22  1705 12/26/22  1141 12/26/22  0409 12/25/22  1714 12/25/22  0328 12/24/22  1513 12/24/22  0912 12/22/22  0424 12/21/22  2247 12/21/22  1837   BUN 51.0*  --  44.0*  --  41.0*   < > 42.0*   < >  --    < > 61.0* 60.0*   Creatinine 3.5*  --  3.1*  --  2.7*   < > 2.7*   < >  --    < > 4.2* 4.1*   eGFR 17.3*  --  20.1*  --  23.7*   < > 23.7*   < >  --    < > 13.9* 14.3*   Sodium 131*  --  134*  --  135   < > 134*   < >  --    < > 135 135   Potassium 4.2  --  4.0  --  3.8   < > 3.6   < >  --    < > 4.9  4.8   Chloride 94*  --  95*  --  97*   < > 98*   < >  --    < > 101 103   CO2 25  --  26  --  25   < > 24   < >  --    < > 17 19   Calcium 9.0  --  9.1  --  9.1   < > 9.0   < >  --    < > 8.7 8.9   Magnesium 2.1  --  2.2  --  2.0  --   --   --   --    < > 2.9* 2.8*   Phosphorus  --   --   --   --   --   --  2.9  --   --   --  7.2* 7.4*   Lactic Acid 1.5 2.7*  --  2.3* 2.7*  --  1.3   < > 1.6   < > 2.2* 1.8   NT-proBNP  --   --   --  9,403*  --   --   --   --   --   --  [id number]*  --    LDH  --   --   --   --   --   --   --   --  507*  --   --   --     < > = values in this interval not displayed.       LFTs        12/27/22  0410 12/26/22  0409 12/25/22  1714 12/24/22  1513 12/24/22  0912   AST (SGOT) 43* 49* 55*   < > 72*   ALT 145* 174* 191*   < > 241*   Alkaline Phosphatase 75 72 72   < > 71   Bilirubin, Total 1.7* 1.7* 1.6*   < > 1.2   Bilirubin Direct 0.8* 0.8*  --   --  0.6*    Bilirubin Indirect 0.9 0.9  --   --  0.6    < > = values in this interval not displayed.       Coags        12/27/22  0410 12/26/22  2024 12/26/22  0409 12/25/22  0328 12/24/22  1112 12/24/22  0301 12/23/22  1713 12/23/22  1200 12/22/22  0424 12/21/22  2247   PT  --   --   --   --  16.3*  --  17.1*  --   --  29.0*   PT INR  --   --   --   --  1.4*  --  1.5*  --   --  2.5*   PTT  --   --   --   --  102*  --  33 63*   < > 32   Anti-Xa, UFH 0.09 0.46 <0.04   < > 0.59   < >  --   --   --  1.21*    < > = values in this interval not displayed.       ABGs          Microbiology  Microbiology Results (last 15 days)       Procedure Component Value Units Date/Time    MRSA culture [id number] Collected: 12/23/22 1200    Order Status: Completed Specimen: Culturette from Nasal Swab Updated: 12/24/22 1311  Culture MRSA Surveillance Negative for Methicillin Resistant Staph aureus    MRSA culture [id number] Collected: 12/23/22 1200    Order Status: Completed Specimen: Culturette from Throat Updated: 12/24/22 1311     Culture MRSA Surveillance Negative for Methicillin Resistant Staph aureus    Culture Blood Aerobic and Anaerobic [id number] Collected: 12/22/22 2239    Order Status: Completed Specimen: Blood, Venipuncture Updated: 12/27/22 0331    Narrative:      The order will result in two separate 8-40m bottles  Please do NOT order repeat blood cultures if one has been  drawn within the last 48 hours  UNLESS concerned for  endocarditis  AVOID BLOOD CULTURE DRAWS FROM CENTRAL LINE IF POSSIBLE  Indications:->Pneumonia  ORDER#: [id number]                                   ORDERED BY: GVear Clock SOURCE: Blood, Venipuncture                          COLLECTED:  12/22/22 22:39  ANTIBIOTICS AT COLL.:                                RECEIVED :  12/23/22 02:00  Culture Blood Aerobic and Anaerobic        PRELIM      12/27/22 03:30  12/24/22   No Growth after 1 day/s of incubation.  12/25/22   No Growth after 2 day/s of  incubation.  12/26/22   No Growth after 3 day/s of incubation.  12/27/22   No Growth after 4 day/s of incubation.      Culture Blood Aerobic and Anaerobic [[id number]: 12/22/22 2239    Order Status: Completed Specimen: Blood, Venipuncture Updated: 12/27/22 0331    Narrative:      The order will result in two separate 8-134mbottles  Please do NOT order repeat blood cultures if one has been  drawn within the last 48 hours  UNLESS concerned for  endocarditis  AVOID BLOOD CULTURE DRAWS FROM CENTRAL LINE IF POSSIBLE  Indications:->Pneumonia  ORDER#: [id number]                                  ORDERED BY: GAVear ClockSOURCE: Blood, Venipuncture                          COLLECTED:  12/22/22 22:39  ANTIBIOTICS AT COLL.:                                RECEIVED :  12/23/22 01:59  Culture Blood Aerobic and Anaerobic        PRELIM      12/27/22 03:30  12/24/22   No Growth after 1 day/s of incubation.  12/25/22   No Growth after 2 day/s of incubation.  12/26/22   No Growth after 3 day/s of incubation.  12/27/22   No Growth after 4 day/s of incubation.      Respiratory Pathogen Panel w/COVID-19, PCR [[id number]   Order Status: Canceled     COVID-19 (SARS-CoV-2) and Influenza A/B, NAA (Liat Rapid)- Admission [[id number]: 12/21/22 1050  Order Status: Completed Specimen: Culturette from Nasopharyngeal Updated: 12/21/22 1144     Purpose of COVID testing Diagnostic -PUI     SARS-CoV-2 Specimen Source Nasal Swab     SARS CoV 2 Overall Result Not Detected     Comment: __________________________________________________  -A result of "Detected" indicates POSITIVE for the    presence of SARS CoV-2 RNA  -A result of "Not Detected" indicates NEGATIVE for the    presence of SARS CoV-2 RNA  __________________________________________________________  Test performed using the Roche cobas Liat SARS-CoV-2 assay. This assay is  only for use under the Food and Drug Administration's Emergency Use  Authorization. This is  a real-time RT-PCR assay for the qualitative  detection of SARS-CoV-2 RNA. Viral nucleic acids may persist in vivo,  independent of viability. Detection of viral nucleic acid does not imply the  presence of infectious virus, or that virus nucleic acid is the cause of  clinical symptoms. Negative results do not preclude SARS-CoV-2 infection and  should not be used as the sole basis for diagnosis, treatment or other  patient management decisions. Negative results must be combined with  clinical observations, patient history, and/or epidemiological information.  Invalid results may be due to inhibiting substances in the specimen and  recollection should occur. Please see Fact Sheets for patients and providers  located:  https://www.benson-chung.com/          Influenza A Not Detected     Influenza B Not Detected     Comment: Test performed using the Roche cobas Liat SARS-CoV-2 & Influenza A/B assay.  This assay is only for use under the Food and Drug Administration's  Emergency Use Authorization. This is a multiplex real-time RT-PCR assay  intended for the simultaneous in vitro qualitative detection and  differentiation of SARS-CoV-2, influenza A, and influenza B virus RNA. Viral  nucleic acids may persist in vivo, independent of viability. Detection of  viral nucleic acid does not imply the presence of infectious virus, or that  virus nucleic acid is the cause of clinical symptoms. Negative results do  not preclude SARS-CoV-2, influenza A, and/or influenza B infection and  should not be used as the sole basis for diagnosis, treatment or other  patient management decisions. Negative results must be combined with  clinical observations, patient history, and/or epidemiological information.  Invalid results may be due to inhibiting substances in the specimen and  recollection should occur. Please see Fact Sheets for patients and providers  located: http://olson-hall.info/.         Narrative:       o Collect and clearly label specimen type:  o PREFERRED-Upper respiratory specimen: One Nasal Swab in  Transport Media.  o Hand deliver to laboratory ASAP  Diagnostic -PUI    Respiratory Pathogen Panel w/COVID-19, PCR [id number] Collected: 12/21/22 0106    Order Status: Canceled Specimen: Nasopharyngeal     Culture Blood Aerobic and Anaerobic [id number] Collected: 12/21/22 0021    Order Status: Completed Specimen: Blood, Venipuncture Updated: 12/26/22 [id number]    Narrative:      Indications:->Other  Other->infection  ORDER#: [id number]                                    ORDERED BY: LEE, SIEW MEI  SOURCE: Blood, Venipuncture RAC                      COLLECTED:  12/21/22 00:21  ANTIBIOTICS AT COLL.:                                RECEIVED :  12/21/22 04:06  Culture Blood Aerobic and Anaerobic        FINAL       12/26/22 06:37  12/26/22   No growth after 5 days of incubation.      Culture Blood Aerobic and Anaerobic [id number] Collected: 12/21/22 0021    Order Status: Completed Specimen: Blood, Venipuncture Updated: 12/26/22 1147    Narrative:      [id number] called Micro Results of Pos Bld. Results read back [id number], by [id number]  on 12/22/2022 at 07:29  Indications:->Other  Other->infection  ORDER#: [id number]                                    ORDERED BY: LEE, SIEW MEI  SOURCE: Blood, Venipuncture LAC                      COLLECTED:  12/21/22 00:21  ANTIBIOTICS AT COLL.:                                RECEIVED :  12/21/22 04:06  [id number] called Micro Results of Pos Bld. Results read back [id number], by [phone] on 12/22/2022 at 07:29  Culture Blood Aerobic and Anaerobic        FINAL       12/26/22 11:47   +  12/22/22   Aerobic Blood Culture Positive in 24 to 48 hours             Gram Stain Shows: Gram positive cocci in clusters  12/26/22   Anaerobic Blood Culture No Growth  12/23/22   Growth of Staphylococcus (coagulase negative)               Possible skin contaminant, susceptibility testing not             performed without  request unless additional blood cultures,             collected within 48 hours of this culture, become positive.             Contact the laboratory for further information if necessary.        MRSA culture [id number] Collected: 12/21/22 0000    Order Status: Canceled Specimen: Culturette from Nasal Swab     MRSA culture [id number] Collected: 12/21/22 0000    Order Status: Canceled Specimen: Culturette from Throat               Cardiac Studies:  Echocardiogram: 12/21/22   * The left ventricle is moderately dilated with LVIDd 6.8 cm.    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    * Severe global hypokinesis with regional variation.    * The right ventricular cavity size is mildly dilated.    * Moderately decreased right ventricular systolic function.    * There is severe mitral regurgitation which is functional secondary to  dilated left ventricle.    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    * Compared to  the prior study dated 09/08/22, the EF remains severely  reduced and there is more significant mitral and tricuspid regurgitation.      Right Heart Catheterization: 12/22/22      Imaging:  Radiology Results (24 Hour)       ** No results found for the last 24 hours. **

## 2022-12-27 NOTE — Progress Notes (Signed)
Pt is alert and oriented x4, follows commands. On RA. No c/o pain and discomfort. On Dobutamine gtt. On Heparin gtt, pt pick his ear is bleeding, apply pressure dressing still soaked gauze, started bleeding from central line IJ, apply quick clot and compression dressing applied. Night covering NP Dinesh made aware, stop heparin. Fall and safety precaution maintained. Will continue monitor for bleeding. Made comfortable.

## 2022-12-27 NOTE — Procedures (Signed)
[name] [name] is a 77 y.o. male patient.  Principal Problem:    Heart failure  Active Problems:    Non-ischemic cardiomyopathy    Nonischemic cardiomyopathy    Chronic systolic congestive heart failure    Acute on chronic right-sided heart failure    Ischemic cardiomyopathy    Iron deficiency anemia secondary to inadequate dietary iron intake    Heart failure, unspecified HF chronicity, unspecified heart failure type    Past Medical History:   Diagnosis Date    Acute CHF     NOV  & DEC 2018, - DIALYSIS CATH PLACED Aug 24 2017 TO REMOVE FLUID     Acute systolic (congestive) heart failure 08/2017    Anxiety     CAD (coronary artery disease)     Cardiomyopathy     nonischemic    CHF (congestive heart failure) 08/12/2014, 2013    Chronic obstructive pulmonary disease     POSSIBLE PER PT HE USES  SYNBICORT BID ABD  BREO INHALER PRN    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    Coronary artery disease 2011    CORONARY STENT PLACEMENT FOLLOWED BY [facility]    Depression     echocardiogram 02/2016, 11/2016, 09/2017, 12/20/2017    GERD (gastroesophageal reflux disease)     Heart attack 2011    and 2014    Hyperlipemia     Hypertension     ICD (implantable 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     PLACED 2014  [facility]  LAST INTERROGATION April 01 2018 REPORT REQUESTED    Ischemic cardiomyopathy     EF 15% ON ECHO 09-20-2017 DR GARG IN EPIC    Nonischemic cardiomyopathy     NSTEMI (non-ST elevated myocardial infarction)     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 rate 19, height 1.829 m (6' 0.01"), weight 92.5 kg (204 lb), SpO2 99 %.    Gordy Councilman    Date/Time: 12/27/2022 11:22 PM    Performed by: Delories Heinz, NP  Authorized by: Delories Heinz, NP  Consent: Written consent obtained.  Risks and benefits: risks, benefits and alternatives were discussed  Consent given by: patient  Patient understanding: patient states understanding of the procedure being performed  Patient consent: the patient's understanding of the procedure matches consent given  Procedure consent: procedure consent matches procedure scheduled  Required items: required blood products, implants, devices, and special equipment available  Patient identity confirmed: arm band, hospital-assigned identification number and verbally with patient  Time out: Immediately prior to procedure a "time out" was called to verify the correct patient, procedure, equipment, support staff and site/side marked as required.    Indications:     Indications:  Right heart cath  Anesthesia: local infiltration  Local Anesthetic: lidocaine 1% without epinephrine  Anesthetic total: 3 mL  Procedure details:   Catheter prepped, flushed and balloon checked: Yes    Preparation:  Skin prepped with 2% chlorhexidine  Skin prep agent dried: Skin prep agent completely dried prior to procedure    Sterile barriers: All five maximal sterile barriers used - gloves, gown, cap, mask and large sterile sheet    Hand hygiene: Hand hygiene performed prior to central venous catheter insertion    Location details: right internal jugular  Site selection rationale:  Ompressible site  Patient position: flat  Catheter type:  MAC  Introducer size: 9 Fr    Catheter size:  7.5 Fr  Pre-procedure: Landmarks identified    Ultrasound guidance: Yes    Sterile ultrasound techniques: Sterile gel and sterile probe covers were used    Number of attempts: 1  Successful  placement: Yes    Balloon inflated once in the SVC/Right Atrium: Yes    PAC advanced using: pressure waveform    Superior vena cava: 20.  RV pressure (mmHg): 55/18.  PA pressure (mmHg): 56/20(34)  Pulmonary artery wedge pressure (mmhg):  24  Catheter distance (cm):  62 (60 without sterile covering)  Estimated blood loss (mL):  2    Post-procedure:   Balloon deflated: Yes    Post-procedure:  Line sutured  Assessment:  Blood return through all ports and no pneumothorax on x-ray  Complications: none  Patient tolerance: patient tolerated the procedure well with no immediate complications  Comments: CXR obtained - PAC pulled back 2cm to 60cm with sterile cover in place.         Delories Heinz, NP  12/27/2022

## 2022-12-27 NOTE — Plan of Care (Signed)
Problem: Compromised Sensory Perception  Goal: Sensory Perception Interventions  Outcome: Progressing  Flowsheets (Taken 12/27/2022 0422)  Sensory Perception Interventions: Offload heels, Pad bony prominences, Reposition q 2hrs/turn Clock, Q2 hour skin assessment under devices if present     Problem: Compromised Moisture  Goal: Moisture level Interventions  Outcome: Progressing  Flowsheets (Taken 12/27/2022 0422)  Moisture level Interventions: Moisture wicking products, Moisture barrier cream     Problem: Compromised Nutrition  Goal: Nutrition Interventions  Outcome: Progressing  Flowsheets (Taken 12/27/2022 0422)  Nutrition Interventions: Discuss nutrition at Rounds, I&Os, Document % meal eaten, Daily weights     Problem: Hemodynamic Status: Cardiac  Goal: Stable vital signs and fluid balance  Outcome: Progressing  Flowsheets (Taken 12/27/2022 0422)  Stable vital signs and fluid balance: Assess signs and symptoms associated with cardiac rhythm changes

## 2022-12-27 NOTE — Progress Notes (Signed)
[facility].  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  -CAD with past PCI   -Mild hyponatremia due to hypervolemia   -Non gap acidosis due to CKD                Plan:     -serum creatinine rising to 3.5 mg/dL   -stopping diuretics for today   -No need for RRT at this time      -supportive care   -please try to avoid contrast studies and nephrotoxins   -daily labs     [name], MD  Epic Secure Chat: direct or group "[facility]  [place] [phone]  Office - [phone]  ++++++++++++++++++++++++++++++++++++++++++++++++++++++++++++++  Chief Complaint:    Interval History:    Medications:  Scheduled Meds:  Current Facility-Administered Medications   Medication Dose Route Frequency    amiodarone  400 mg Oral TID    aspirin EC  81 mg Oral Daily    atorvastatin  40 mg Oral QHS    heparin (porcine)  5,000 Units Subcutaneous Q8H [place]    hydrALAZINE  100 mg Oral Q8H [place]    insulin glargine  33 Units Subcutaneous Q12H    Or    insulin glargine  15 Units Subcutaneous Q12H    insulin lispro  1-6 Units Subcutaneous QHS    insulin lispro  10 Units Subcutaneous TID AC    insulin lispro  2-10 Units Subcutaneous TID AC    iron sucrose  200 mg Intravenous Q24H SCH    isosorbide dinitrate  20 mg Oral TID - Nitrate Free Interval    niacin  500 mg Oral BID    polyethylene glycol  17 g Oral Daily    senna-docusate  1 tablet Oral Q12H [place]     Continuous Infusions:   DOBUTamine 7.5 mcg/kg/min (12/26/22 2235)     PRN Meds:acetaminophen, dextrose **OR** dextrose **OR** dextrose  **OR** glucagon (rDNA), phenol    Objective:  Vital signs in last 24 hours:  Temp:  [97.9 F (36.6 C)-98.7 F (37.1 C)] 98 F (36.7 C)  Heart Rate:  [83-92] 91  Resp Rate:  [17-19] 18  BP: (103-120)/(55-66) 109/58  Intake/Output last 24 hours:    Intake/Output Summary (Last 24 hours) at 12/27/2022 0842  Last data filed at 12/27/2022 0500  Gross per 24 hour   Intake 1103 ml   Output 1125 ml   Net -22 ml     Intake/Output this shift:  No intake/output data recorded.    Physical Exam:   Gen: Well developed, no acute distress   CV: S1 S2 N RRR, no edema  Chest: Good effort, CTAB    Labs:  Recent Labs   Lab 12/27/22  0410 12/26/22  1705 12/26/22  0409 12/25/22  1714 12/25/22  0328 12/22/22  0424 12/21/22  2247 12/21/22  1837   Glucose 263* 272* 284* 245* 283*  More results in Results Review 199* 167*   BUN 51.0* 44.0* 41.0* 40.0* 42.0*  More results in Results Review 61.0* 60.0*   Creatinine 3.5* 3.1* 2.7* 2.6* 2.7*  More results in Results Review 4.2* 4.1*   Calcium 9.0 9.1 9.1 9.3 9.0  More results in Results Review 8.7 8.9   Sodium 131* 134* 135 134* 134*  More results in Results Review 135 135   Potassium 4.2 4.0 3.8 3.8 3.6  More results in Results Review 4.9 4.8   Chloride 94* 95* 97* 97* 98*  More results in Results Review 101 103   CO2 '25 26 25 25 24  '$ More results in Results Review 17 19   Albumin 3.1*  --  3.0* 3.1* 3.0*  More results in Results Review 3.1*  --    Phosphorus  --   --   --   --  2.9  --  7.2* 7.4*   Magnesium 2.1 2.2 2.0  --   --   More results in Results Review 2.9* 2.8*   More results in Results Review = values in this interval not displayed.     Recent Labs   Lab 12/27/22  0410 12/26/22  0409 12/25/22  1714   WBC 6.39 5.77 6.40   Hgb 9.6* 10.0* 9.9*   Hematocrit 29.4* 30.3* 30.1*   MCV 101.4* 99.7* 99.0*   MCH 33.1 32.9 32.6   MCHC 32.7 33.0 32.9   RDW 16* 16* 16*   MPV 12.3 12.1 12.2   Platelets 67* 65* 69*

## 2022-12-27 NOTE — Progress Notes (Signed)
HEMATOLOGY ONCOLOGY PROGRESS NOTE  [facility] [place]   [phone]    Date: 12/27/22    [name] [name] is a 77 y.o. male who follows with Dr. Mervin Kung at [facility] for management of CLL who is admitted for heart failure.      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 Currently on low-dose dobutamine and intermittent diuresis. Pending LVAD evaluation. Management per cardiology and nephrology.    #History of DVT  Initially diagnosed 02/2022, was on apixaban prior to admission and continued on heparin gtt on admission. Last LE [place] 04/2022 with resolution. In the setting of recent line bleeding & thrombocytopenia, agree with holing gtt and once stable/feasible would recommend resuming low-dose heparin SC for DVT ppx in the short term.    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 Currently holding in the setting of thrombocytopenia with PLT ~70K from normal baseline. Unclear etiology, nutritional studies and hemolysis labs normal, HIT Ab & SRA negative. Suspect likely due to acute illness with cardiogenic shock and AKI necessitating inotropic support as above, less likely immune thrombocytopenia in the setting of CLL although unlikely given overall good control of disease. He platelet count is stable and safe >50K in the absence of bleeding at this time, however should he necessitate a procedure requiring higher PLT goal would consider trial of steroids for possible ITP.    Thank you for allowing [place] to participate in the care of this patient.     Vincent Gros, MD  Hematology/Oncology  [facility]  [phone]    Subjective:   Overnight with some bleeding from L central line on heparin gtt, held. Also with some minor epistaxis. H/H and PLT count stable. Otherwise no significant complaints.     Physical Exam      Vitals:    12/27/22 0451   BP: 109/58   Pulse:    Resp: 18   Temp: 98 F (36.7 C)   SpO2: 97%     Chronically ill appearing, no distress  AAOx3, MMM, EOMI  R neck pressure dressing in place without evidence of bleeding  Breathing comfortably on room air  No LE edema    Medications     Current Facility-Administered Medications   Medication Dose Route Frequency    amiodarone  400 mg Oral TID    aspirin EC  81 mg Oral Daily    atorvastatin  40 mg Oral QHS    heparin (porcine)  5,000 Units Subcutaneous Q8H [place]    hydrALAZINE  100 mg Oral Q8H [place]    insulin glargine  33 Units Subcutaneous Q12H    Or    insulin glargine  15 Units Subcutaneous Q12H    insulin lispro  1-6 Units Subcutaneous QHS    insulin lispro  10 Units Subcutaneous TID AC    insulin lispro  2-10 Units Subcutaneous TID AC    iron sucrose  200 mg Intravenous Q24H SCH    isosorbide dinitrate  20 mg Oral TID - Nitrate Free Interval    niacin  500 mg Oral BID    polyethylene glycol  17 g Oral Daily    senna-docusate  1 tablet Oral Q12H [facility]         Labs:     Results       Procedure Component Value Units Date/Time  Glucose Whole Blood - POCT [id number]  (Abnormal) Collected: 12/27/22 0739     Updated: 12/27/22 0820     Whole Blood Glucose POCT 219 mg/dL     Magnesium [id number] Collected: 12/27/22 0410    Specimen: Blood Updated: 12/27/22 0545     Magnesium 2.1 mg/dL     Hepatic function panel (LFT) [id number]  (Abnormal) Collected: 12/27/22 0410    Specimen: Blood Updated: 12/27/22 0545     Bilirubin, Total 1.7 mg/dL      Bilirubin Direct 0.8 mg/dL      Bilirubin Indirect 0.9 mg/dL      AST (SGOT) 43 U/L      ALT 145 U/L      Alkaline Phosphatase 75 U/L      Protein, Total 6.2 g/dL      Albumin 3.1 g/dL      Globulin 3.1 g/dL      Albumin/Globulin Ratio 1.0    Basic Metabolic Panel [id number]  (Abnormal) Collected: 12/27/22 0410    Specimen: Blood Updated: 12/27/22 0545     Glucose 263 mg/dL      BUN 51.0 mg/dL      Creatinine 3.5 mg/dL      Calcium  9.0 mg/dL      Sodium 131 mEq/L      Potassium 4.2 mEq/L      Chloride 94 mEq/L      CO2 25 mEq/L      Anion Gap 12.0     eGFR 17.3 mL/min/1.73 m2     CBC and differential [id number]  (Abnormal) Collected: 12/27/22 0410    Specimen: Blood Updated: 12/27/22 0531     WBC 6.39 x10 3/uL      Hgb 9.6 g/dL      Hematocrit 29.4 %      Platelets 67 x10 3/uL      RBC 2.90 x10 6/uL      MCV 101.4 fL      MCH 33.1 pg      MCHC 32.7 g/dL      RDW 16 %      MPV 12.3 fL      Instrument Absolute Neutrophil Count 3.89 x10 3/uL      Neutrophils 60.7 %      Lymphocytes Automated 32.9 %      Monocytes 3.8 %      Eosinophils Automated 1.1 %      Basophils Automated 0.2 %      Immature Granulocytes 1.3 %      Nucleated RBC 0.6 /100 WBC      Neutrophils Absolute 3.89 x10 3/uL      Lymphocytes Absolute Automated 2.10 x10 3/uL      Monocytes Absolute Automated 0.24 x10 3/uL      Eosinophils Absolute Automated 0.07 x10 3/uL      Basophils Absolute Automated 0.01 x10 3/uL      Immature Granulocytes Absolute 0.08 x10 3/uL      Absolute NRBC 0.04 x10 3/uL     Anti-Xa, UFH [id number] Collected: 12/27/22 0410     Updated: 12/27/22 0528     Anti-Xa, UFH 0.09 IU/mL     Narrative:      Every 8 hours after initiation and every rate change. After  two subsequent values within goal range, change frequency to  IHS daily at 0400 until heparin is discontinued.  If heparin infusion is on MAR hold do not draw this lab.    Lactic Acid [id number] Collected: 12/27/22 0410  Specimen: Blood Updated: 12/27/22 0425     Lactic Acid 1.5 mmol/L     Narrative:      To be drawn with each measure of co-oximetry.    Culture Blood Aerobic and Anaerobic [id number] Collected: 12/22/22 2239    Specimen: Blood, Venipuncture Updated: 12/27/22 0331    Narrative:      The order will result in two separate 8-78m bottles  Please do NOT order repeat blood cultures if one has been  drawn within the last 48 hours  UNLESS concerned for  endocarditis  AVOID BLOOD CULTURE DRAWS  FROM CENTRAL LINE IF POSSIBLE  Indications:->Pneumonia  ORDER#: [id number]                                   ORDERED BY: GVear Clock SOURCE: Blood, Venipuncture                          COLLECTED:  12/22/22 22:39  ANTIBIOTICS AT COLL.:                                RECEIVED :  12/23/22 02:00  Culture Blood Aerobic and Anaerobic        PRELIM      12/27/22 03:30  12/24/22   No Growth after 1 day/s of incubation.  12/25/22   No Growth after 2 day/s of incubation.  12/26/22   No Growth after 3 day/s of incubation.  12/27/22   No Growth after 4 day/s of incubation.      Culture Blood Aerobic and Anaerobic [[id number]: 12/22/22 2239    Specimen: Blood, Venipuncture Updated: 12/27/22 0331    Narrative:      The order will result in two separate 8-112mbottles  Please do NOT order repeat blood cultures if one has been  drawn within the last 48 hours  UNLESS concerned for  endocarditis  AVOID BLOOD CULTURE DRAWS FROM CENTRAL LINE IF POSSIBLE  Indications:->Pneumonia  ORDER#: [id number]                                  ORDERED BY: GAVear ClockSOURCE: Blood, Venipuncture                          COLLECTED:  12/22/22 22:39  ANTIBIOTICS AT COLL.:                                RECEIVED :  12/23/22 01:59  Culture Blood Aerobic and Anaerobic        PRELIM      12/27/22 03:30  12/24/22   No Growth after 1 day/s of incubation.  12/25/22   No Growth after 2 day/s of incubation.  12/26/22   No Growth after 3 day/s of incubation.  12/27/22   No Growth after 4 day/s of incubation.      Glucose Whole Blood - POCT [[id number](Abnormal) Collected: 12/26/22 2059     Updated: 12/26/22 2110     Whole Blood Glucose POCT 320 mg/dL     Anti-Xa, UFH [[id number]: 12/26/22 2024     Updated: 12/26/22 2103  Anti-Xa, UFH 0.46 IU/mL     Narrative:      Every 8 hours after initiation and every rate change. After  two subsequent values within goal range, change frequency to  IHS daily at 0400 until heparin is  discontinued.  If heparin infusion is on MAR hold do not draw this lab.    Lactic Acid [id number]  (Abnormal) Collected: 12/26/22 1802    Specimen: Blood Updated: 12/26/22 1811     Lactic Acid 2.7 mmol/L     Basic Metabolic Panel [id number]  (Abnormal) Collected: 12/26/22 1705    Specimen: Blood Updated: 12/26/22 1747     Glucose 272 mg/dL      BUN 44.0 mg/dL      Creatinine 3.1 mg/dL      Calcium 9.1 mg/dL      Sodium 134 mEq/L      Potassium 4.0 mEq/L      Chloride 95 mEq/L      CO2 26 mEq/L      Anion Gap 13.0     eGFR 20.1 mL/min/1.73 m2     Magnesium [id number] Collected: 12/26/22 1705    Specimen: Blood Updated: 12/26/22 1747     Magnesium 2.2 mg/dL     Glucose Whole Blood - POCT [id number]  (Abnormal) Collected: 12/26/22 1657     Updated: 12/26/22 1710     Whole Blood Glucose POCT 282 mg/dL     Serotonin Release Assay (SRA), UFH [id number] Collected: 12/24/22 2113     Updated: 12/26/22 1504     UFH Serotonin Result: Negative     UFH Low Dose, 0.1 IU/mL 0 % Release      UFH Low Dose, 0.5 IU/mL 0 % Release      UFH High Dose, 100 IU/mL 0 % Release     NT-proBNP [id number]  (Abnormal) Collected: 12/26/22 1141     Updated: 12/26/22 1244     NT-proBNP 9,403 pg/mL     Glucose Whole Blood - POCT [id number]  (Abnormal) Collected: 12/26/22 1220     Updated: 12/26/22 1228     Whole Blood Glucose POCT 212 mg/dL     Lactic Acid [id number]  (Abnormal) Collected: 12/26/22 1141    Specimen: Blood Updated: 12/26/22 1152     Lactic Acid 2.3 mmol/L               Imaging   Tunneled Cath Placement (Permcath)    Result Date: 12/25/2022  Right internal jugular vein approach 6 French single lumen Hohn catheter tip in the superior cavoatrial junction. PLAN: Catheter is ready for use. Erik Obey, MD 12/25/2022 3:22 PM    X-ray chest AP portable    Result Date: 12/25/2022  No change mild edema [name], MD 12/25/2022 8:36 AM    X-ray chest AP portable    Result Date: 12/24/2022  Stable exam. No new acute intrathoracic process. Montine Circle, MD 12/24/2022 11:44 AM    X-ray chest AP portable    Result Date: 12/23/2022  Improved inspiratory effort with mild dependent atelectasis [name], MD 12/23/2022 8:16 AM    XR Chest AP Portable    Result Date: 12/23/2022   Airspace disease in the medial right lower lobe. Blanchard Mane 12/23/2022 12:46 AM    XR Chest AP Portable    Result Date: 12/22/2022  1. Right Swan-Ganz catheter tip in the proximal right lower pulmonary artery. No pneumothorax. 2. Mild vascular congestion, small right pleural effusion and right basilar atelectasis. Ardelia Mems, MD 12/22/2022 4:59  PM    Chest AP Portable    Result Date: 12/20/2022  No acute intrathoracic process. Montine Circle, MD 12/20/2022 9:12 PM

## 2022-12-27 NOTE — Progress Notes (Signed)
Pt still bleeding from IJ site , applied pressure dressing. Pt have mild nose bleeding too. NP Dinesh made aware, will address morning team to follow up.   Labs Anti xa therapeutic x2. Will monitor CBC and bleeding. CHG bath given. Pt Ambulate hallway. Fall and safety precaution maintained.

## 2022-12-27 NOTE — Consults (Signed)
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%, NYHA Class 4, ACC/AHA Stage D, LVIDd 6.8 cm  Acute decompensated HF  Narrow complex supraventricular tachycardia, s/p unsuccessful cardioversion 3/4  Coronary artery disease s/p PCI/DES in 2011 with 3 stents and then 2 stents when he was in [place]  S/p Medtronic BiV ICD  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 At this moment from a CLL standpoint, he is quite stable.  Right lower extremity DVT (5/23)   Type 2 diabetes with an A1c 5.9%  Stable COPD  Multiple hospital admissions for acute decompensated heart failure in 2023  DM2  Thrombocytopenia  Iron deficit anemia   Iron studies (3/6): Iron sat 10, Ferritin 57  S/p Iron repletion (iron sucrose 200 mg x5 from 3/6 to 3/10)  Depression   Consult to psych   Aspiration event  Aspirated on 12/22/22, passed swallow eval thereafter       Recommendations:     Cardiogenic shock  Worsening perfusion while on the floor despite increased in dobutamine to 10 mcg/kg/min with worsening lactic acidosis and kidney function   Preload: agree with lasix gtt at 20 mg/hr. Can add diuril 1000 mg to further increase diuresis.   Inotropes: c/w dobutamine at 10 mcg/kg/min for now (we will wait to obtain some UOP before downtitrating the inotropes, currently CI by both Fick and TD > 2.5 and no arrhythmias at  telemetry)  Afterload: would target a MAP of 65-70 with nipride drip  Mcs: currently not needed  Advanced therapies candidacy is undergoing for potential LVAD. Potential barriers are represented by the CKD and functional status.   In case of deterioration a shock call would be initiated to consider potential escalation to [facility]  Ischemic Cardiomyopathy, EF 11%  Diuresis as above  GDMT:  Beta Blockers: hold in the setting of shock  ACEi/ARB/ARNI: hold in the setting of worsening kidney function  MRA: hold in the setting of worsening kidney function  SGLT2i: hold in the setting of worsening kidney function  CAD  S/p multiple stents   Right lower extremity DVT in 02/2022  Thrombocytopenia  ASA 81 mg daily  At home on eliquis (transitioned to heparin gtt in the hospital), on hold due to bleeding from the lines.   Narrow complex tachycardia with failed ablation  NSVT in the last few days  Continue amiodarone loading (12/24/22 to 12/31/22) to complete 10 grams followed by amiodarone 200 mg daily     Rest as per [facility]    Plan discussed during rounds with primary team and RN    Signed by: Pilar Grammes, MD, MD [place]         CICU Cardiology Fellow, PGY 5  Date/Time: 12/27/22 10:05 PM          Reason for consult:     Reason for Admission to CICU (  select one)  Primary Cardiac Problem warranting ICU care    Presenting Primary Cardiac Problem (select one)  Cardiogenic shock (etiology not otherwise listed)    Presenting Secondary Cardiac Problem (select one)  Heart failure     Indications (select one)  Cardiogenic shock      History:     This is a ***    PMH:    Past Medical History:   Diagnosis Date    Acute CHF     NOV  & DEC 2018, - DIALYSIS CATH PLACED Aug 24 2017 TO REMOVE FLUID     Acute systolic (congestive) heart failure 08/2017    Anxiety     CAD (coronary artery disease)     Cardiomyopathy     nonischemic    CHF (congestive heart failure) 08/12/2014, 2013    Chronic obstructive pulmonary disease     POSSIBLE PER PT HE USES   SYNBICORT BID ABD  BREO INHALER PRN    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    Coronary artery disease 2011    CORONARY STENT PLACEMENT FOLLOWED BY [facility]    Depression     echocardiogram 02/2016, 11/2016, 09/2017, 12/20/2017    GERD (gastroesophageal reflux disease)     Heart attack 2011    and 2014    Hyperlipemia     Hypertension     ICD (implantable 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     PLACED 2014  [facility]  LAST INTERROGATION April 01 2018 REPORT REQUESTED    Ischemic cardiomyopathy     EF 15% ON ECHO 09-20-2017 DR GARG IN EPIC    Nonischemic cardiomyopathy     NSTEMI (non-ST elevated myocardial infarction)     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    Pacemaker 2014    MEDTRONIC ICD/PACEMAKER COMBO LAST INTERROGATION  12-12 2018    Pneumonia 09/2016    Primary cardiomyopathy     Ischemic cardiomyopathy EF 15% ON ECHO 09-20-2017 DR GARG IN EPIC    Sepsis 08/2017    Syncope and collapse     PRIOR TO ICD/PACEMAKER PLACED 2014    Type 2 diabetes mellitus, controlled DX 1998    BS  AVG 100  A1C 7.4 Dec 27 2017    [place]     PT SEE HIS PMD April 01 2018 RE THIS-TOLD TO SEE PMD BY [facility] JUNE 12 WHEN THEY SAW HIM FOR A CL       PSH:    Past Surgical History:   Procedure Laterality Date    BIV  11/10/2016    ICD METRONIC  UPGRADE     CARDIAC CATHETERIZATION  03/2010    CORONARY STENT PLACEMENT X 2 PER PT; LM normal, LAD 70-80% distal lesion at apex, 20% lesion mid CFX    CARDIAC CATHETERIZATION  12/2012    CARDIAC DEFIBRILLATOR PLACEMENT  12/2012    [facility]    CARDIAC PACEMAKER PLACEMENT  2014    MEDTRONIC ICD/PACEMAKER PLACED [facility]    CIRCUMCISION  AGE 6    COLONOSCOPY  2009    CORRECTION HAMMER TOE      duodenal ulcer  1973    STRESS RELATED IN SCHOOL    ECHOCARDIOGRAM, TRANSTHORACIC  02/2016 11/2016 09/2017 12/2017    ECHOCARDIOGRAM, TRANSTHORACIC  02/2016,11/2016,12/20/2017    EGD N/A 08/15/2014    Procedure: EGD;  Surgeon: Loraine Maple, MD;  Location: [facility] OR;  Service: Gastroenterology;  Laterality:  N/A;  egd w/ bx    EGD  1975  EGD, COLONOSCOPY N/A 04/04/2018    Procedure: EGD with bxs, COLONOSCOPY with polypectomy and clipping;  Surgeon: Omer Jack, MD;  Location: [facility] OR;  Service: Gastroenterology;  Laterality: N/A;    FRACTURE SURGERY  AGE 63     RT  ANKLE- CAST APPLIED    HERNIA REPAIR  AGE 79    LEFT INGUINAL HERNIA    MPI nuclear study  06/2016    orthopedic surgery  AGE 77    RT foot corrective - HAMMERTOE    RIGHT HEART CATH Right 09/09/2022    Procedure: Right Heart Cath;  Surgeon: Wandra Arthurs, MD;  Location: [facility];  Service: Cardiovascular;  Laterality: Right;  tx from [place] INCL. CO AND SATS Right 12/22/2022    Procedure: Right Heart Cath ONLY incl. CO and Sats;  Surgeon: Day, Lillette Boxer, MD;  Location: [facility] C[facility];  Service: Cardiovascular;  Laterality: Right;  Right Heart Cath with leave in [place] cath for milrinone initiation    Roy       FH:  Family History   Problem Relation Age of Onset    Breast cancer Mother     Heart attack Mother 67    Hypertension Mother     Diabetes Mother     Coronary artery disease Mother     Diabetes Brother     Heart attack Father     Hypertension Father        Social History:  Social History     Socioeconomic History    Marital status: Married     Spouse name: Architect    Number of children: 0    Years of education: Not on file    Highest education level: Not on file   Occupational History    Occupation: Pharmacist, 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[employer], history    Tobacco Use    Smoking status: Never    Smokeless tobacco: Never   Vaping Use    Vaping Use: Never used   Substance and Sexual Activity    Alcohol use: Not Currently    Drug use: No    Sexual activity: Yes     Partners: Female   Other Topics Concern    Not on file   Social History Narrative    Not on file     Social Determinants of Health     Financial Resource Strain: Low Risk  (12/22/2022)    Overall Financial Resource Strain (CARDIA)      Difficulty of Paying Living Expenses: Not hard at all   Food Insecurity: No Food Insecurity (09/12/2022)    Hunger Vital Sign     Worried About Running Out of Food in the Last Year: Never true     [place] in the Last Year: Never true   Transportation Needs: No Transportation Needs (12/22/2022)    PRAPARE - Armed forces logistics/support/administrative officer (Medical): No     Lack of Transportation (Non-Medical): No   Physical Activity: Not on file   Stress: Not on file   Social Connections: Not on file   Intimate Partner Violence: Not At Risk (09/12/2022)    Humiliation, Afraid, Rape, and Kick questionnaire     Fear of Current or Ex-Partner: No     Emotionally Abused: No     Physically Abused: No     Sexually Abused: No   Housing Stability: Low Risk  (12/22/2022)  Housing Stability Vital Sign     Unable to Pay for Housing in the Last Year: No     Number of Places Lived in the Last Year: 1     Unstable Housing in the Last Year: No       Allergies:  Allergies   Allergen Reactions    Allopurinol        Home medications:   Prior to Admission medications    Medication Sig Start Date End Date Taking? Authorizing Provider   apixaban (ELIQUIS) 5 MG Take 1 tablet (5 mg) by mouth every 12 (twelve) hours For 1 week followed by 1 tablet twice daily 09/14/22   Irma Newness, MD   aspirin EC 81 MG EC tablet Take 1 tablet (81 mg) by mouth daily    [provider]   atorvastatin (LIPITOR) 40 MG tablet Take 1 tablet (40 mg total) by mouth nightly 07/16/21   Carlis Abbott, MD   Calquence 100 MG Tab  04/07/22   [provider]   hydrALAZINE (APRESOLINE) 25 MG tablet Take 1 tablet (25 mg) by mouth every 8 (eight) hours 09/14/22 12/13/22  Irma Newness, MD   Insulin Glargine-yfgn (Semglee, yfgn,) 100 UNIT/ML Solution Pen-injector 50 Units by Subdermal route daily 10/26/22   Clearance Coots, MD   isosorbide dinitrate (ISORDIL) 10 MG tablet Take 1 tablet (10 mg) by mouth 3 (three) times daily 09/14/22 12/13/22  Irma Newness,  MD   Jardiance 10 MG tablet TAKE 1 TABLET(10 MG) BY MOUTH DAILY 06/05/22   Clotilde Dieter, NP   loratadine (CLARITIN) 10 MG tablet Take 1 tablet (10 mg) by mouth daily 04/18/22   Regis Bill, MD   metoprolol succinate XL (TOPROL-XL) 25 MG 24 hr tablet Take 0.5 tablets (12.5 mg) by mouth daily 05/22/22   Clotilde Dieter, NP   niacin (NIASPAN) 500 MG CR tablet TAKE 1 TABLET BY MOUTH TWICE DAILY  Patient taking differently: 250 mg Taking half a tablet 02/23/22   Clearance Coots, MD   torsemide (DEMADEX) 20 MG tablet Take 2 tablets (40 mg) by mouth 2 (two) times daily 09/14/22   Irma Newness, MD        Inpatient/ER medications:  Medications Prior to Admission   Medication Sig    apixaban (ELIQUIS) 5 MG Take 1 tablet (5 mg) by mouth every 12 (twelve) hours For 1 week followed by 1 tablet twice daily    aspirin EC 81 MG EC tablet Take 1 tablet (81 mg) by mouth daily    atorvastatin (LIPITOR) 40 MG tablet Take 1 tablet (40 mg total) by mouth nightly    Calquence 100 MG Tab     hydrALAZINE (APRESOLINE) 25 MG tablet Take 1 tablet (25 mg) by mouth every 8 (eight) hours    Insulin Glargine-yfgn (Semglee, yfgn,) 100 UNIT/ML Solution Pen-injector 50 Units by Subdermal route daily    isosorbide dinitrate (ISORDIL) 10 MG tablet Take 1 tablet (10 mg) by mouth 3 (three) times daily    Jardiance 10 MG tablet TAKE 1 TABLET(10 MG) BY MOUTH DAILY    loratadine (CLARITIN) 10 MG tablet Take 1 tablet (10 mg) by mouth daily    metoprolol succinate XL (TOPROL-XL) 25 MG 24 hr tablet Take 0.5 tablets (12.5 mg) by mouth daily    niacin (NIASPAN) 500 MG CR tablet TAKE 1 TABLET BY MOUTH TWICE DAILY (Patient taking differently: 250 mg Taking half a tablet)    torsemide (DEMADEX) 20 MG tablet Take 2 tablets (40 mg) by  mouth 2 (two) times daily       Current Inpatient :  Current Facility-Administered Medications   Medication Dose Route Frequency    amiodarone  400 mg Oral TID    aspirin EC  81 mg Oral Daily    atorvastatin  40 mg Oral QHS    [Held by  provider] heparin (porcine)  5,000 Units Subcutaneous Q8H [place]    hydrALAZINE  100 mg Oral Q8H [place]    insulin glargine  33 Units Subcutaneous Q12H    Or    insulin glargine  15 Units Subcutaneous Q12H    insulin lispro  1-6 Units Subcutaneous QHS    insulin lispro  10 Units Subcutaneous TID AC    insulin lispro  2-10 Units Subcutaneous TID AC    isosorbide dinitrate  20 mg Oral TID - Nitrate Free Interval    niacin  500 mg Oral BID    polyethylene glycol  17 g Oral Daily    senna-docusate  1 tablet Oral Q12H [place]      DOBUTamine 10 mcg/kg/min (12/27/22 1723)    furosemide (LASIX) infusion 2 mg/mL 20 mg/hr (12/27/22 2055)         Objectives:       ROS:  No constitutional complaints, no respiratory complaints, no musculoskeletal complaints, or other pertinent review of symptoms except as noted in the history of present illness.    Vitals:  Vitals:    12/27/22 2035   BP: 108/56   Pulse: 84   Resp:    Temp:    SpO2:         Intake / Output:    Intake/Output Summary (Last 24 hours) at 12/27/2022 2205  Last data filed at 12/27/2022 2100  Gross per 24 hour   Intake 1207.85 ml   Output 525 ml   Net 682.85 ml        Weight:      12/26/2022     4:01 AM 12/27/2022     4:11 AM 12/27/2022     6:00 AM   Weight Monitoring   Weight 89.54 kg 92.4 kg 92.534 kg   Weight Method Bed Scale Bed Scale    BMI (calculated) 26.8 kg/m2 27.6 kg/m2 27.7 kg/m2        Weight change: 2.86 kg (6 lb 4.9 oz)     Physical Exam:  General: Healthy-appearing, in no distress.  Alert and oriented x 3  Eyes: No xanthelasma.  Oropharynx: No mucosal pallor.  Neck: Trachea midline, JVD up to the ear  Pulmonary: crackles at the bases  Cardiovascular: PMI nondisplaced with normal rate and rhythm, normal S1, normal S2, holosystolic murmur   Abdomen: Soft, nontender.  Extremities: no edema, pulses present bilaterally  Skin: multiple excoriations with visible venous small oozing of blood.    Psych:  Mood was normal    POCUS:  LV: severely dilated with reduced function  (visually estimated EF 10% range) and LVIDd 7.0 cm  RV: severe dilated with reduced annular escursion (TAPSE: 1.2 cm RV S' 6 cm/s)  Atria: biatrial enlargement  Aortic valve: no significant AI visualized, valve appears to open well  Pulmonary Valve: not well visualized  Mitral Valve: likely ventricular functional mitral regurgitation at least moderate at color doppler  Tricuspid valve: likely severe TR in the presence of pacemaker crossing the TV  IVC: dilated to 2.5 cm non compressible  Pericardium: trace pericardial effusion    Pacemaker interrogation:  PPM Interrogation    Device type CRT-D  device   Manufacturer Medtronic   Model Claria MRI Quad   Implant date 11/10/2016 (Dr. Efrain Sella)   Indication  Resynchronization and primary prevention     Presenting rhythm:  A-sensed, V-paced  Underlying rhythm:  sinus rhythm with LBBB    Leads  RA 1.50 V @ 0.40 ms 0.30 mV 342 ohms   RV 2.00 V @ 0.40 ms 0.30 mV 285 ohms   LV 2.25 V @ 0.40 ms  513 ohms     Diagnostic Data  A-paced 1.2 %   V-paced 93.6 % (91.9% effective)     High rate episodes  A-episodes None recently   V-episodes Few short NSVT, longer of which was 15 seconds long on December 25 2022     Battery status:  satisfactory (remains 19 months)    Final Parameters:  Mode  DDDR    Lower-Upper Rate 60 - 171 bpm    VT1 <171 bpm Observe only   VT 171 -200 bpm OFF   VF >200bpm shock     Notes / Changes made:  No changes were made           EKG:  A sensed V paced rhythm with normal QRS    Labs:  CBC        12/27/22  0410 12/26/22  0409 12/25/22  1714   WBC 6.39 5.77 6.40   Hgb 9.6* 10.0* 9.9*   Hematocrit 29.4* 30.3* 30.1*   Platelets 67* 65* 69*       BMP        12/27/22  2023 12/27/22  1706 12/27/22  0410 12/26/22  1802 12/26/22  1705 12/26/22  1141 12/25/22  1714 12/25/22  0328 12/24/22  1513 12/24/22  0912 12/22/22  0424 12/21/22  2247   BUN  --  52.0* 51.0*  --  44.0*  --    < > 42.0*   < >  --    < > 61.0*   Creatinine  --  4.1* 3.5*  --  3.1*  --    < > 2.7*   < >  --     < > 4.2*   eGFR  --  14.3* 17.3*  --  20.1*  --    < > 23.7*   < >  --    < > 13.9*   Sodium  --  132* 131*  --  134*  --    < > 134*   < >  --    < > 135   Potassium  --  3.9 4.2  --  4.0  --    < > 3.6   < >  --    < > 4.9   Chloride  --  95* 94*  --  95*  --    < > 98*   < >  --    < > 101   CO2  --  24 25  --  26  --    < > 24   < >  --    < > 17   Calcium  --  8.9 9.0  --  9.1  --    < > 9.0   < >  --    < > 8.7   Magnesium  --  2.2 2.1  --  2.2  --    < >  --   --   --    < > 2.9*   Phosphorus  --   --   --   --   --   --   --  2.9  --   --   --  7.2*   Lactic Acid 2.9*  --  1.5 2.7*  --  2.3*   < > 1.3   < > 1.6   < > 2.2*   NT-proBNP  --   --   --   --   --  9,403*  --   --   --   --   --  [id number]*   LDH  --   --   --   --   --   --   --   --   --  507*  --   --     < > = values in this interval not displayed.       LFTs        12/27/22  0410 12/26/22  0409 12/25/22  1714 12/24/22  1513 12/24/22  0912   AST (SGOT) 43* 49* 55*   < > 72*   ALT 145* 174* 191*   < > 241*   Alkaline Phosphatase 75 72 72   < > 71   Bilirubin, Total 1.7* 1.7* 1.6*   < > 1.2   Bilirubin Direct 0.8* 0.8*  --   --  0.6*   Bilirubin Indirect 0.9 0.9  --   --  0.6    < > = values in this interval not displayed.       Coags        12/27/22  0410 12/26/22  2024 12/26/22  0409 12/25/22  0328 12/24/22  1112 12/24/22  0301 12/23/22  1713 12/23/22  1200 12/22/22  0424 12/21/22  2247   PT  --   --   --   --  16.3*  --  17.1*  --   --  29.0*   PT INR  --   --   --   --  1.4*  --  1.5*  --   --  2.5*   PTT  --   --   --   --  102*  --  33 63*   < > 32   Anti-Xa, UFH 0.09 0.46 <0.04   < > 0.59   < >  --   --   --  1.21*    < > = values in this interval not displayed.     Microbiology  Microbiology Results (last 15 days)       Procedure Component Value Units Date/Time    MRSA culture [id number] Collected: 12/23/22 1200    Order Status: Completed Specimen: Culturette from Nasal Swab Updated: 12/24/22 1311     Culture MRSA Surveillance Negative for  Methicillin Resistant Staph aureus    MRSA culture [id number] Collected: 12/23/22 1200    Order Status: Completed Specimen: Culturette from Throat Updated: 12/24/22 1311     Culture MRSA Surveillance Negative for Methicillin Resistant Staph aureus    Culture Blood Aerobic and Anaerobic [id number] Collected: 12/22/22 2239    Order Status: Completed Specimen: Blood, Venipuncture Updated: 12/27/22 0331    Narrative:      The order will result in two separate 8-35m bottles  Please do NOT order repeat blood cultures if one has been  drawn within the last 48 hours  UNLESS concerned for  endocarditis  AVOID BLOOD CULTURE DRAWS FROM CENTRAL LINE IF POSSIBLE  Indications:->Pneumonia  ORDER#: [id number]  ORDERED BY: Vear Clock  SOURCE: Blood, Venipuncture                          COLLECTED:  12/22/22 22:39  ANTIBIOTICS AT COLL.:                                RECEIVED :  12/23/22 02:00  Culture Blood Aerobic and Anaerobic        PRELIM      12/27/22 03:30  12/24/22   No Growth after 1 day/s of incubation.  12/25/22   No Growth after 2 day/s of incubation.  12/26/22   No Growth after 3 day/s of incubation.  12/27/22   No Growth after 4 day/s of incubation.      Culture Blood Aerobic and Anaerobic [id number] Collected: 12/22/22 2239    Order Status: Completed Specimen: Blood, Venipuncture Updated: 12/27/22 0331    Narrative:      The order will result in two separate 8-43m bottles  Please do NOT order repeat blood cultures if one has been  drawn within the last 48 hours  UNLESS concerned for  endocarditis  AVOID BLOOD CULTURE DRAWS FROM CENTRAL LINE IF POSSIBLE  Indications:->Pneumonia  ORDER#: [id number]                                   ORDERED BY: GVear Clock SOURCE: Blood, Venipuncture                          COLLECTED:  12/22/22 22:39  ANTIBIOTICS AT COLL.:                                RECEIVED :  12/23/22 01:59  Culture Blood Aerobic and Anaerobic        PRELIM       12/27/22 03:30  12/24/22   No Growth after 1 day/s of incubation.  12/25/22   No Growth after 2 day/s of incubation.  12/26/22   No Growth after 3 day/s of incubation.  12/27/22   No Growth after 4 day/s of incubation.      Respiratory Pathogen Panel w/COVID-19, PCR [[id number]    Order Status: Canceled     COVID-19 (SARS-CoV-2) and Influenza A/B, NAA (Liat Rapid)- Admission [[id number]: 12/21/22 1050    Order Status: Completed Specimen: Culturette from Nasopharyngeal Updated: 12/21/22 1144     Purpose of COVID testing Diagnostic -PUI     SARS-CoV-2 Specimen Source Nasal Swab     SARS CoV 2 Overall Result Not Detected     Comment: __________________________________________________  -A result of "Detected" indicates POSITIVE for the    presence of SARS CoV-2 RNA  -A result of "Not Detected" indicates NEGATIVE for the    presence of SARS CoV-2 RNA  __________________________________________________________  Test performed using the Roche cobas Liat SARS-CoV-2 assay. This assay is  only for use under the Food and Drug Administration's Emergency Use  Authorization. This is a real-time RT-PCR assay for the qualitative  detection of SARS-CoV-2 RNA. Viral nucleic acids may persist in vivo,  independent of viability. Detection of viral nucleic acid does not imply the  presence of infectious virus, or that virus nucleic acid is the cause of  clinical symptoms. Negative results do not preclude SARS-CoV-2 infection and  should not be used as the sole basis for diagnosis, treatment or other  patient management decisions. Negative results must be combined with  clinical observations, patient history, and/or epidemiological information.  Invalid results may be due to inhibiting substances in the specimen and  recollection should occur. Please see Fact Sheets for patients and providers  located:  https://www.benson-chung.com/          Influenza A Not Detected     Influenza B Not Detected     Comment:  Test performed using the Roche cobas Liat SARS-CoV-2 & Influenza A/B assay.  This assay is only for use under the Food and Drug Administration's  Emergency Use Authorization. This is a multiplex real-time RT-PCR assay  intended for the simultaneous in vitro qualitative detection and  differentiation of SARS-CoV-2, influenza A, and influenza B virus RNA. Viral  nucleic acids may persist in vivo, independent of viability. Detection of  viral nucleic acid does not imply the presence of infectious virus, or that  virus nucleic acid is the cause of clinical symptoms. Negative results do  not preclude SARS-CoV-2, influenza A, and/or influenza B infection and  should not be used as the sole basis for diagnosis, treatment or other  patient management decisions. Negative results must be combined with  clinical observations, patient history, and/or epidemiological information.  Invalid results may be due to inhibiting substances in the specimen and  recollection should occur. Please see Fact Sheets for patients and providers  located: http://olson-hall.info/.         Narrative:      o Collect and clearly label specimen type:  o PREFERRED-Upper respiratory specimen: One Nasal Swab in  Transport Media.  o Hand deliver to laboratory ASAP  Diagnostic -PUI    Respiratory Pathogen Panel w/COVID-19, PCR [id number] Collected: 12/21/22 0106    Order Status: Canceled Specimen: Nasopharyngeal     Culture Blood Aerobic and Anaerobic [id number] Collected: 12/21/22 0021    Order Status: Completed Specimen: Blood, Venipuncture Updated: 12/26/22 [id number]    Narrative:      Indications:->Other  Other->infection  ORDER#: [id number]                                    ORDERED BY: LEE, SIEW MEI  SOURCE: Blood, Venipuncture RAC                      COLLECTED:  12/21/22 00:21  ANTIBIOTICS AT COLL.:                                RECEIVED :  12/21/22 04:06  Culture Blood Aerobic and Anaerobic        FINAL       12/26/22 06:37  12/26/22    No growth after 5 days of incubation.      Culture Blood Aerobic and Anaerobic [id number] Collected: 12/21/22 0021    Order Status: Completed Specimen: Blood, Venipuncture Updated: 12/26/22 1147    Narrative:      [id number] called Micro Results of Pos Bld. Results read back by:[id number], by [id number]  on 12/22/2022 at 07:29  Indications:->Other  Other->infection  ORDER#: [id number]  ORDERED BY: LEE, SIEW MEI  SOURCE: Blood, Venipuncture LAC                      COLLECTED:  12/21/22 00:21  ANTIBIOTICS AT COLL.:                                RECEIVED :  12/21/22 04:06  [id number] called Micro Results of Pos Bld. Results read back by:[id number], by [phone] on 12/22/2022 at 07:29  Culture Blood Aerobic and Anaerobic        FINAL       12/26/22 11:47   +  12/22/22   Aerobic Blood Culture Positive in 24 to 48 hours             Gram Stain Shows: Gram positive cocci in clusters  12/26/22   Anaerobic Blood Culture No Growth  12/23/22   Growth of Staphylococcus (coagulase negative)               Possible skin contaminant, susceptibility testing not             performed without request unless additional blood cultures,             collected within 48 hours of this culture, become positive.             Contact the laboratory for further information if necessary.        MRSA culture [id number] Collected: 12/21/22 0000    Order Status: Canceled Specimen: Culturette from Nasal Swab     MRSA culture [id number] Collected: 12/21/22 0000    Order Status: Canceled Specimen: Culturette from Throat               Cardiac Studies:  Echocardiogram: 12/21/22    * Limited echo to evaluate biventricular function.    * The left ventricle is moderately dilated with LVIDd 6.8 cm.    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    * Severe global hypokinesis with regional variation.    * The right ventricular cavity size is mildly dilated.    * Moderately  decreased right ventricular systolic function.    * There is severe mitral regurgitation which is functional secondary to  dilated left ventricle.    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    * Compared to the prior study dated 09/08/22, the EF remains severely  reduced and there is more significant mitral and tricuspid regurgitation.      Right Heart Catheterization:   12/27/22 at bedside  CVP  20 mmHg  RV  55/18  PA  56/20 (34)  PCWP  24 with V 28  TC CO/CI 7.0/3.1  SvO2  66.9%        Imaging:  Radiology Results (24 Hour)       ** No results found for the last 24 hours. **

## 2022-12-27 NOTE — Plan of Care (Signed)
Received message from primary RN regarding patient having bleeding from home central line site and scratch on the ear..    - holding heparin drip for now,     - repeat CBC, anti-xa in the AM, if appropriate will resume heparin drip

## 2022-12-27 NOTE — Procedures (Signed)
[name] [name] is a 77 y.o. male patient.  Principal Problem:    Heart failure  Active Problems:    Non-ischemic cardiomyopathy    Nonischemic cardiomyopathy    Chronic systolic congestive heart failure    Acute on chronic right-sided heart failure    Ischemic cardiomyopathy    Iron deficiency anemia secondary to inadequate dietary iron intake    Heart failure, unspecified HF chronicity, unspecified heart failure type    Past Medical History:   Diagnosis Date    Acute CHF     NOV  & DEC 2018, - DIALYSIS CATH PLACED Aug 24 2017 TO REMOVE FLUID     Acute systolic (congestive) heart failure 08/2017    Anxiety     CAD (coronary artery disease)     Cardiomyopathy     nonischemic    CHF (congestive heart failure) 08/12/2014, 2013    Chronic obstructive pulmonary disease     POSSIBLE PER PT HE USES  SYNBICORT BID ABD  BREO INHALER PRN    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    Coronary artery disease 2011    CORONARY STENT PLACEMENT FOLLOWED BY [facility]    Depression     echocardiogram 02/2016, 11/2016, 09/2017, 12/20/2017    GERD (gastroesophageal reflux disease)     Heart attack 2011    and 2014    Hyperlipemia     Hypertension     ICD (implantable 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     PLACED 2014  [facility]  LAST INTERROGATION April 01 2018 REPORT REQUESTED    Ischemic cardiomyopathy     EF 15% ON ECHO 09-20-2017 DR GARG IN EPIC    Nonischemic cardiomyopathy     NSTEMI (non-ST elevated myocardial infarction)     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 rate 19, height 1.829 m (6' 0.01"), weight 92.5 kg (204 lb), SpO2 99 %.    Insert Arterial Line    Date/Time: 12/27/2022 11:30 PM    Performed by: Lucrezia Europe, MD  Authorized by: Lucrezia Europe, MD  Consent: Verbal consent obtained. Written consent obtained.  Risks and benefits: risks, benefits and alternatives were discussed  Consent given by: patient  Patient understanding: patient states understanding of the procedure being performed  Patient consent: the patient's understanding of the procedure matches consent given  Procedure consent: procedure consent matches procedure scheduled  Relevant documents: relevant documents present and verified  Test results: test results available and properly labeled  Site marked: the operative site was marked  Imaging studies: imaging studies available  Required items: required blood products, implants, devices, and special equipment available  Patient identity confirmed: verbally with patient and arm band  Time out: Immediately prior to procedure a "  time out" was called to verify the correct patient, procedure, equipment, support staff and site/side marked as required.  Preparation: Patient was prepped and draped in the usual sterile fashion.  Indications: hemodynamic monitoring  Location: right radial  Anesthesia: local infiltration    Anesthesia:  Local Anesthetic: lidocaine 1% without epinephrine  Anesthetic total: 4 mL    Sedation:  Patient sedated: no    Allen's test normal: yes  Needle gauge: 20  Seldinger technique: Seldinger technique used  Number of attempts: 1  Post-procedure: line sutured and dressing applied  Post-procedure CMS: normal  Patient tolerance: patient tolerated the procedure well with no immediate complications          Lucrezia Europe, MD  12/27/2022

## 2022-12-28 ENCOUNTER — Encounter
Admission: AD | Disposition: A | Discharge status: Home Health | Payer: Self-pay | Source: Other Acute Inpatient Hospital | Attending: Critical Care Medicine

## 2022-12-28 LAB — BASIC METABOLIC PANEL
Anion Gap: 14 (ref 5.0–15.0)
Anion Gap: 13 (ref 5.0–15.0)
Anion Gap: 11 (ref 5.0–15.0)
Anion Gap: 13 (ref 5.0–15.0)
BUN: 56 mg/dL — ABNORMAL HIGH (ref 9.0–28.0)
BUN: 57 mg/dL — ABNORMAL HIGH (ref 9.0–28.0)
BUN: 62 mg/dL — ABNORMAL HIGH (ref 9.0–28.0)
BUN: 66 mg/dL — ABNORMAL HIGH (ref 9.0–28.0)
CO2: 23 mEq/L (ref 17–29)
CO2: 24 mEq/L (ref 17–29)
CO2: 25 mEq/L (ref 17–29)
CO2: 23 mEq/L (ref 17–29)
Calcium: 8.9 mg/dL (ref 7.9–10.2)
Calcium: 8.9 mg/dL (ref 7.9–10.2)
Calcium: 8.6 mg/dL (ref 7.9–10.2)
Calcium: 8.7 mg/dL (ref 7.9–10.2)
Chloride: 94 mEq/L — ABNORMAL LOW (ref 99–111)
Chloride: 93 mEq/L — ABNORMAL LOW (ref 99–111)
Chloride: 93 mEq/L — ABNORMAL LOW (ref 99–111)
Chloride: 94 mEq/L — ABNORMAL LOW (ref 99–111)
Creatinine: 4.6 mg/dL — ABNORMAL HIGH (ref 0.5–1.5)
Creatinine: 4.6 mg/dL — ABNORMAL HIGH (ref 0.5–1.5)
Creatinine: 4.9 mg/dL — ABNORMAL HIGH (ref 0.5–1.5)
Creatinine: 4.8 mg/dL — ABNORMAL HIGH (ref 0.5–1.5)
Glucose: 91 mg/dL (ref 70–100)
Glucose: 168 mg/dL — ABNORMAL HIGH (ref 70–100)
Glucose: 246 mg/dL — ABNORMAL HIGH (ref 70–100)
Glucose: 95 mg/dL (ref 70–100)
Potassium: 3.7 mEq/L (ref 3.5–5.3)
Potassium: 3.9 mEq/L (ref 3.5–5.3)
Potassium: 3.8 mEq/L (ref 3.5–5.3)
Potassium: 4 mEq/L (ref 3.5–5.3)
Sodium: 131 mEq/L — ABNORMAL LOW (ref 135–145)
Sodium: 130 mEq/L — ABNORMAL LOW (ref 135–145)
Sodium: 129 mEq/L — ABNORMAL LOW (ref 135–145)
Sodium: 130 mEq/L — ABNORMAL LOW (ref 135–145)
eGFR: 12.5 mL/min/{1.73_m2} — AB (ref >=60)
eGFR: 12.5 mL/min/{1.73_m2} — AB (ref >=60)
eGFR: 11.6 mL/min/{1.73_m2} — AB (ref >=60)
eGFR: 11.9 mL/min/{1.73_m2} — AB (ref >=60)

## 2022-12-28 LAB — CBC AND DIFFERENTIAL
Absolute NRBC: 0 10*3/uL (ref 0.00–0.00)
Basophils Absolute Automated: 0.01 10*3/uL (ref 0.00–0.08)
Basophils Automated: 0.1 %
Eosinophils Absolute Automated: 0.08 10*3/uL (ref 0.00–0.44)
Eosinophils Automated: 1.2 %
Hematocrit: 26.8 % — ABNORMAL LOW (ref 37.6–49.6)
Hgb: 8.9 g/dL — ABNORMAL LOW (ref 12.5–17.1)
Immature Granulocytes Absolute: 0.07 10*3/uL (ref 0.00–0.07)
Immature Granulocytes: 1 %
Instrument Absolute Neutrophil Count: 3.89 10*3/uL (ref 1.10–6.33)
Lymphocytes Absolute Automated: 2.37 10*3/uL (ref 0.42–3.22)
Lymphocytes Automated: 34.4 %
MCH: 32.6 pg (ref 25.1–33.5)
MCHC: 33.2 g/dL (ref 31.5–35.8)
MCV: 98.2 fL — ABNORMAL HIGH (ref 78.0–96.0)
MPV: 11.4 fL (ref 8.9–12.5)
Monocytes Absolute Automated: 0.47 10*3/uL (ref 0.21–0.85)
Monocytes: 6.8 %
Neutrophils Absolute: 3.89 10*3/uL (ref 1.10–6.33)
Neutrophils: 56.5 %
Nucleated RBC: 0 /100 WBC (ref 0.0–0.0)
Platelets: 69 10*3/uL — ABNORMAL LOW (ref 142–346)
RBC: 2.73 10*6/uL — ABNORMAL LOW (ref 4.20–5.90)
RDW: 17 % — ABNORMAL HIGH (ref 11–15)
WBC: 6.89 10*3/uL (ref 3.10–9.50)

## 2022-12-28 LAB — HEPATIC FUNCTION PANEL (LFT)
ALT: 108 U/L — ABNORMAL HIGH (ref 0–55)
AST (SGOT): 35 U/L (ref 5–41)
Albumin/Globulin Ratio: 1 (ref 0.9–2.2)
Albumin: 3 g/dL — ABNORMAL LOW (ref 3.5–5.0)
Alkaline Phosphatase: 64 U/L (ref 37–117)
Bilirubin Direct: 0.8 mg/dL — ABNORMAL HIGH (ref 0.0–0.5)
Bilirubin Indirect: 1.1 mg/dL — ABNORMAL HIGH (ref 0.2–1.0)
Bilirubin, Total: 1.9 mg/dL — ABNORMAL HIGH (ref 0.2–1.2)
Globulin: 3 g/dL (ref 2.0–3.6)
Protein, Total: 6 g/dL (ref 6.0–8.3)

## 2022-12-28 LAB — COOXIMETRY PROFILE
Carboxyhemoglobin: 2.1 % (ref 0.0–3.0)
Carboxyhemoglobin: 2.4 % (ref 0.0–3.0)
Carboxyhemoglobin: 2.1 % (ref 0.0–3.0)
Carboxyhemoglobin: 2.5 % (ref 0.0–3.0)
Hematocrit Total Calculated: 26.9 % — ABNORMAL LOW (ref 40.0–54.0)
Hematocrit Total Calculated: 28.9 % — ABNORMAL LOW (ref 40.0–54.0)
Hematocrit Total Calculated: 29.5 % — ABNORMAL LOW (ref 40.0–54.0)
Hematocrit Total Calculated: 28.7 % — ABNORMAL LOW (ref 40.0–54.0)
Hemoglobin Total: 8.7 g/dL — ABNORMAL LOW (ref 13.0–17.0)
Hemoglobin Total: 9.3 g/dL — ABNORMAL LOW (ref 13.0–17.0)
Hemoglobin Total: 9.5 g/dL — ABNORMAL LOW (ref 13.0–17.0)
Hemoglobin Total: 9.3 g/dL — ABNORMAL LOW (ref 13.0–17.0)
Methemoglobin: 1.1 % (ref 0.0–3.0)
Methemoglobin: 0.6 % (ref 0.0–3.0)
Methemoglobin: 1.2 % (ref 0.0–3.0)
Methemoglobin: 0.6 % (ref 0.0–3.0)
O2 Content: 8.6 Vol %
O2 Content: 8.1 Vol %
O2 Content: 8.2 Vol %
O2 Content: 8.5 Vol %
Oxygenated Hemoglobin: 70.6 % — ABNORMAL LOW (ref 85.0–98.0)
Oxygenated Hemoglobin: 61.9 % — ABNORMAL LOW (ref 85.0–98.0)
Oxygenated Hemoglobin: 61.5 % — ABNORMAL LOW (ref 85.0–98.0)
Oxygenated Hemoglobin: 65 % — ABNORMAL LOW (ref 85.0–98.0)

## 2022-12-28 LAB — URINE PROTEIN/CREATININE RATIO
Urine Creatinine, Random: 66.4 mg/dL
Urine Protein Random: 8 mg/dL (ref 1.0–14.0)
Urine Protein/Creatinine Ratio: 0.1

## 2022-12-28 LAB — LACTIC ACID
Lactic Acid: 0.9 mmol/L (ref 0.2–2.0)
Lactic Acid: 1.2 mmol/L (ref 0.2–2.0)
Lactic Acid: 1.1 mmol/L (ref 0.2–2.0)
Lactic Acid: 0.9 mmol/L (ref 0.2–2.0)

## 2022-12-28 LAB — MAGNESIUM
Magnesium: 2.1 mg/dL (ref 1.6–2.6)
Magnesium: 2.1 mg/dL (ref 1.6–2.6)
Magnesium: 2.3 mg/dL (ref 1.6–2.6)
Magnesium: 2.3 mg/dL (ref 1.6–2.6)

## 2022-12-28 LAB — WHOLE BLOOD GLUCOSE POCT
Whole Blood Glucose POCT: 93 mg/dL (ref 70–100)
Whole Blood Glucose POCT: 186 mg/dL — ABNORMAL HIGH (ref 70–100)
Whole Blood Glucose POCT: 157 mg/dL — ABNORMAL HIGH (ref 70–100)
Whole Blood Glucose POCT: 262 mg/dL — ABNORMAL HIGH (ref 70–100)
Whole Blood Glucose POCT: 110 mg/dL — ABNORMAL HIGH (ref 70–100)

## 2022-12-28 LAB — TYPE AND SCREEN
AB Screen Gel: NEGATIVE
ABO Rh: O POS

## 2022-12-28 SURGERY — RIGHT HEART CATH
Anesthesia: Local | Laterality: Right

## 2022-12-28 MED ORDER — BUMETANIDE 0.25 MG/ML IJ SOLN
4.0000 mg | Freq: Once | INTRAMUSCULAR | Status: AC
Start: 2022-12-28 — End: 2022-12-28
  Administered 2022-12-28: 4 mg via INTRAVENOUS
  Filled 2022-12-28: qty 16

## 2022-12-28 MED ORDER — MELATONIN 3 MG PO TABS
6.0000 mg | ORAL_TABLET | Freq: Every evening | ORAL | Status: DC | PRN
Start: 2022-12-28 — End: 2023-01-04
  Administered 2022-12-29 – 2023-01-02 (×3): 6 mg via ORAL
  Filled 2022-12-28 (×3): qty 2

## 2022-12-28 MED ORDER — AMIODARONE HCL 200 MG PO TABS
200.0000 mg | ORAL_TABLET | Freq: Every day | ORAL | Status: DC
Start: 2022-12-30 — End: 2023-01-04
  Administered 2022-12-30 – 2023-01-04 (×6): 200 mg via ORAL
  Filled 2022-12-28 (×6): qty 1

## 2022-12-28 MED ORDER — BUMETANIDE INFUSION 0.25 MG/ML
3.0000 mg/h | INTRAVENOUS | Status: DC
Start: 2022-12-28 — End: 2022-12-31
  Administered 2022-12-28 – 2022-12-29 (×4): 2 mg/h via INTRAVENOUS
  Administered 2022-12-30: 3 mg/h via INTRAVENOUS
  Administered 2022-12-30: 2 mg/h via INTRAVENOUS
  Administered 2022-12-30 – 2022-12-31 (×3): 3 mg/h via INTRAVENOUS
  Filled 2022-12-28 (×10): qty 100

## 2022-12-28 NOTE — UM Notes (Signed)
Patient Name: [name] [name], [name] [name]  DOB: 06-03-1946  Unit: [facility] CICU  Level of Care:  ICU    Continued Stay Review  Identified [place] barriers: Medical Readiness  PT/OT Discharge Recommendation: Home with supervision    CSR Date: 12/27/22    77 y/o man with history of ischemic cardiomyopathy transferred from [facility] 3/4 where he had been admitted with SVT s/p cardioversion attempts, return to NSR after adenosine dose. Patient was initiate don dobutamine therapy, transferred to [facility] with cardiogenic shock. Patient underwent right heart catheterization, after which he received aggressive diuresis and medical optimization with improvement of creatinine and clearance of lactate. Patient transferred to CTU 3/8 where diuresis was continued, however with uptrending creatinine and lactic acidosis, patient was transferred back to CICU and PA catheter replaced 3/10.     Vitals:  Temp:  [97.7 F (36.5 C)-98.3 F (36.8 C)] 97.9 F (36.6 C)  Heart Rate:  [78-91] 88  Resp Rate:  [17-50] 50  BP: (101-114)/(50-61) 114/61    Intake & Output:  Intake/Output Summary (Last 24 hours) at 12/27/2022 2205  Last data filed at 12/27/2022 2100      Gross per 24 hour   Intake 1207.85 ml   Output 525 ml   Net 682.85 ml     Abnormal Labs:   12/27/22 04:10 12/27/22 23:40   Hemoglobin 9.6 (L)    Hemoglobin Total  9.3 (L)   Hematocrit 29.4 (L)    Hematocrit Total Calculated  28.9 (L)   Platelet Count 67 (L)    RBC 2.90 (L)       12/27/22 04:10 12/27/22 17:06 12/27/22 20:23   BUN 51.0 (H) 52.0 (H)    Creatinine 3.5 (H) 4.1 (H)    Sodium 131 (L) 132 (L)    Chloride 94 (L) 95 (L)    eGFR 17.3 ! 14.3 !    Lactic Acid 1.5  2.9 (H)   AST 43 (H)     ALT 145 (H)     Bilirubin Total 1.7 (H)     Bilirubin Direct 0.8 (H)       Diagnostics:  XR Chest AP Portable  Result Date: 12/27/2022   Stable positioning of right IJ PA catheter projecting over the right lower lobar pulmonary artery. Blanchard Mane 12/27/2022 11:45 PM      Medications:  Current Facility-Administered Medications   Medication Dose Route Frequency    amiodarone  400 mg Oral TID    aspirin EC  81 mg Oral Daily    atorvastatin  40 mg Oral QHS    [Held by provider] heparin (porcine)  5,000 Units Subcutaneous Q8H [place]    hydrALAZINE  100 mg Oral Q8H [place]    insulin glargine  33 Units Subcutaneous Q12H     Or    insulin glargine  15 Units Subcutaneous Q12H    insulin lispro  1-6 Units Subcutaneous QHS    insulin lispro  10 Units Subcutaneous TID AC    insulin lispro  2-10 Units Subcutaneous TID AC    isosorbide dinitrate  20 mg Oral TID - Nitrate Free Interval    niacin  500 mg Oral BID    polyethylene glycol  17 g Oral Daily    senna-docusate  1 tablet Oral Q12H [place]       DOBUTamine 10 mcg/kg/min (12/27/22 1723)    furosemide (LASIX) infusion 2 mg/mL 20 mg/hr (12/27/22 2055)     IV PRN & One-Time Medications over  last 24 hours:  Diuril 500 mg IV x 1  Lasix 120 mg IV x 1    Assessment/Plan:  Critical Care Medicine:  PA catheter placed, trend Fick CO/CI  Continue dobutamine at 10 mcg/kg/min  Aggressive diuresis, lasix bolus and infusion, diuril based on PA catheter filling pressures  Afterload reduction with hydralazine, isordil TID. Holding additional GDMT due to declining renal function.   ASA, amiodarone, atorvastatin continues  Trend serum chemistry, monitor for improvement in renal function  Heparin infusion held due to bleeding from line sites  Psychiatry team consulted for depression  Resume diet, SQ heparin  Hematology consultation appreciated, thrombocytopenia stable  Insulin glargine 33 U SQ q12, lispro with meals  Bowel regimen miralax, pericolace. Last BM 3/10.   LVAD evaluation underway, to be presented at committee meeting 3/13    Hematology/Oncology:  #Cardiogenic shock  #SVT  #AKI  EF 10-15%. Currently on low-dose dobutamine and intermittent diuresis. Pending LVAD evaluation. Management per cardiology and nephrology.  #History of DVT  Initially diagnosed  02/2022, was on apixaban prior to admission and continued on heparin gtt on admission. Last LE [place] 04/2022 with resolution. In the setting of recent line bleeding & thrombocytopenia, agree with holing gtt and once stable/feasible would recommend resuming low-dose heparin SC for DVT ppx in the short term.  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 Currently holding in the setting of thrombocytopenia with PLT ~70K from normal baseline. Unclear etiology, nutritional studies and hemolysis labs normal, HIT Ab & SRA negative. Suspect likely due to acute illness with cardiogenic shock and AKI necessitating inotropic support as above, less likely immune thrombocytopenia in the setting of CLL although unlikely given overall good control of disease. He platelet count is stable and safe >50K in the absence of bleeding at this time, however should he necessitate a procedure requiring higher PLT goal would consider trial of steroids for possible ITP.    Cardiology:  Cardiogenic shock  Worsening perfusion while on the floor despite increased in dobutamine to 10 mcg/kg/min with worsening lactic acidosis and kidney function   Preload: agree with lasix gtt at 20 mg/hr. Can add diuril 1000 mg to further increase diuresis.   Inotropes: c/w dobutamine at 10 mcg/kg/min for now (we will wait to obtain some UOP before downtitrating the inotropes, currently CI by both Fick and TD > 2.5 and no arrhythmias at telemetry)  Afterload: would target a MAP of 65-70 with nipride drip  Mcs: currently not needed  Advanced therapies candidacy is undergoing for potential LVAD. Potential barriers are represented by the CKD and functional status.   In case of deterioration a shock call would be initiated to consider potential escalation to [facility]  Ischemic Cardiomyopathy, EF 11%  Diuresis as above  GDMT:  Beta Blockers: hold in the  setting of shock  ACEi/ARB/ARNI: hold in the setting of worsening kidney function  MRA: hold in the setting of worsening kidney function  SGLT2i: hold in the setting of worsening kidney function  CAD  S/p multiple stents   Right lower extremity DVT in 02/2022  Thrombocytopenia  ASA 81 mg daily  At home on eliquis (transitioned to heparin gtt in the hospital), on hold due to bleeding from the lines.   Narrow complex tachycardia with failed ablation  NSVT in the last few days  Continue amiodarone loading (12/24/22 to 12/31/22) to complete 10 grams followed by amiodarone 200  mg daily     CSR Date: 12/28/22    Lasix gtt stopped this morning. IVP Bumex and started Bumex gtt. Remains on Dobutamine gtt. PA Catheter in place.     Vitals:  Temp:  [97.7 F (36.5 C)-98.4 F (36.9 C)]   Heart Rate:  [78-93]   Resp Rate:  [17-84]   BP: (101-114)/(50-66)   Arterial Line BP: (106-133)/(41-53)   SpO2:  [94 %-99 %]   Height:  [182.9 cm (6')-182.9 cm (6' 0.01")]   Weight:  [96 kg (211 lb 10.3 oz)]   BMI (calculated):  [28.7]     Intake & Output:  I/O last 3 completed shifts:  In: 2232.32 [P.O.:1160; I.V.:1072.32]  Out: 1335 [id number]  I/O this shift:  In: 328.9 [P.O.:300; I.V.:28.9]  Out: 225 [Urine:225]        12/26/2022     4:01 AM 12/27/2022     4:11 AM 12/27/2022     6:00 AM 12/27/2022    11:00 PM   Weight Monitoring   Weight 89.54 kg 92.4 kg 92.534 kg 96 kg     Abnormal Labs:   12/28/22 03:51   Hemoglobin 8.9 (L)   Hemoglobin Total 8.7 (L)   Hematocrit 26.8 (L)   Hematocrit Total Calculated 26.9 (L)   Platelet Count 69 (L)   RBC 2.73 (L)      12/28/22 03:51   BUN 56.0 (H)   Creatinine 4.6 (H)   Sodium 131 (L)   Chloride 94 (L)      12/28/22 03:51   ALT 108 (H)   Bilirubin Total 1.9 (H)   Bilirubin Direct 0.8 (H)   Bilirubin Indirect 1.1 (H)     Diagnostics:  XR Chest AP Portable  Result Date: 12/28/2022  Support hardware as above. No new acute findings. Montine Circle, MD 12/28/2022 7:36 AM    Medications:  Scheduled Meds:  Current  Facility-Administered Medications   Medication Dose Route Frequency    amiodarone  400 mg Oral TID    aspirin EC  81 mg Oral Daily    atorvastatin  40 mg Oral QHS    heparin (porcine)  5,000 Units Subcutaneous Q8H [place]    hydrALAZINE  100 mg Oral Q8H [place]    insulin glargine  33 Units Subcutaneous Q12H    Or    insulin glargine  15 Units Subcutaneous Q12H    insulin lispro  1-6 Units Subcutaneous QHS    insulin lispro  10 Units Subcutaneous TID AC    insulin lispro  2-10 Units Subcutaneous TID AC    isosorbide dinitrate  20 mg Oral TID - Nitrate Free Interval    niacin  500 mg Oral BID    polyethylene glycol  17 g Oral Daily    senna-docusate  1 tablet Oral Q12H [place]     Continuous Infusions:   bumetanide 2 mg/hr (12/28/22 0700)    DOBUTamine 7.5 mcg/kg/min (12/28/22 0700)     IV PRN & One-Time Medications over last 24 hours:  Bumex 4 mg IV x 1    Primary Payor: [employer] / Plan: CIGNA POS / Product Type: COMMERCIAL /   UTILIZATION REVIEW CONTACT: Robyne Askew RN, BSN  Utilization Review   [place]  [phone]  [phone]  Email: [email or web address]  Tax [id number]      NOTES TO REVIEWER:     This clinical review is based on/compiled from documentation provided by the treatment team within the patient's medical record.

## 2022-12-28 NOTE — Plan of Care (Signed)
Ad from [place]. Pt A&O x4, afebrile and following commands. VSS. RIJ PAC and R radial A-line placed at bedside. Lasix gtt increased d/t minimal UOP. Bladder scan performed. (See I&Os). Transitioned to Bumex gtt (see MAR). All safety precautions in place and pt free of any s/s of injury.    Problem: Compromised Sensory Perception  Goal: Sensory Perception Interventions  Outcome: Progressing     Problem: Compromised Moisture  Goal: Moisture level Interventions  Outcome: Progressing     Problem: Compromised Activity/Mobility  Goal: Activity/Mobility Interventions  Outcome: Progressing     Problem: Compromised Nutrition  Goal: Nutrition Interventions  Outcome: Progressing     Problem: Compromised Friction/Shear  Goal: Friction and Shear Interventions  Outcome: Progressing     Problem: Moderate/High Fall Risk Score >5  Goal: Patient will remain free of falls  Outcome: Progressing     Problem: Hemodynamic Status: Cardiac  Goal: Stable vital signs and fluid balance  Outcome: Progressing     Problem: Side Effects from Pain Analgesia  Goal: Patient will experience minimal side effects of analgesic therapy  Outcome: Progressing

## 2022-12-28 NOTE — Progress Notes (Signed)
Pt transferred to CICU rm 103 without incident. RN & MD at bedside.

## 2022-12-28 NOTE — Consults (Signed)
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%, NYHA Class 4, ACC/AHA Stage D, LVIDd 6.8 cm  Acute decompensated HF  Narrow complex supraventricular tachycardia, s/p unsuccessful cardioversion 3/4  Coronary artery disease s/p PCI/DES in 2011 with 3 stents and then 2 stents when he was in [place]  S/p Medtronic BiV ICD  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 At this moment from a CLL standpoint, he is quite stable.  Right lower extremity DVT (5/23)   Type 2 diabetes with an A1c 5.9%  Stable COPD  Multiple hospital admissions for acute decompensated heart failure in 2023  DM2  Thrombocytopenia  Iron deficit anemia   Iron studies (3/6): Iron sat 10, Ferritin 57  S/p Iron repletion (iron sucrose 200 mg x5 from 3/6 to 3/10)  Depression   Consult to psych   Aspiration event  Aspirated on 12/22/22, passed swallow eval thereafter       Recommendations:     Cardiogenic shock  Preload: Continue with Bumex gtt at '2mg'$ /hr (failed lasix drip of 20 and 40 mg/hr)  Inotropes: Decrease dobutamine to 5 mcg/kg/min  Afterload: would target a systolic of 123XX123 mmHg and hold meds if below. Continue hydralazine 100 mg TID and isordil 20 TID  MCS: currently not  needed  Advanced therapies candidacy is undergoing for potential LVAD. Potential barriers are represented by the CKD and functional status.   In case of deterioration a shock call would be initiated to consider potential escalation to [facility]  Ischemic Cardiomyopathy, EF 11%  Diuresis as above  GDMT:  Beta Blockers: hold in the setting of shock  ACEi/ARB/ARNI: hold in the setting of worsening kidney function  MRA: hold in the setting of worsening kidney function  SGLT2i: hold in the setting of worsening kidney function  CAD  S/p multiple stents   Right lower extremity DVT in 02/2022  Thrombocytopenia  ASA 81 mg daily  At home on eliquis (transitioned to heparin gtt in the hospital), on hold due to bleeding from the lines and ears  Narrow complex tachycardia with failed ablation  NSVT in the last few days  Continue amiodarone loading (12/24/22 to 12/31/22) to complete 10 grams followed by amiodarone 200 mg daily starting 3/15  Overall appears to be poor prognosis given worsening renal function and advanced age  Rest as per [place] discussed during rounds with primary team and RN    Signed by: Bascom Levels, MD, Cardiology Fellow  Date/Time: 12/28/22 6:24 AM          Reason for consult:     Reason for Admission to CICU (select one)  Primary Cardiac Problem warranting ICU care  Presenting Primary Cardiac Problem (select one)  Cardiogenic shock (etiology not otherwise listed)    Presenting Secondary Cardiac Problem (select one)  Heart failure     Indications (select one)  Cardiogenic shock      History:     77 y.o. 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  He was given a dose of adenosine and had an unsuccessful cardioversion with  120 J.  Adenosine eventually converted to sinus rhythm and telemetry showed a sensed V paced rhythms.     Admitted to ICU at [facility] for further management. However, then transferred to [place] on new Dobutamine 2.5 mcg/kg/min for LVAD consideration. He was diuresed appropriately and then transferred to the medicine floor on dobutamine at 5. Due to increased lactic acidosis, his dobutamine was increased on the floor to 10. Today his creatinine trended up and his urine output decreased, therefore he was readmitted to the CICU for treatment of cardiogenic shock.      Of note follows with [place] heart cardiology. Has been followed by Advanced Heart Failure for advanced therapies, including LVAD where he has been seen by Dr. Judeth Horn. Home medications include Eliquis 2.5 mg BID, ASA 81 mg QD, Lipitor 40 mg QHS, Jardiance 10 mg QD, Hydralazine 25 mg Q8H, Isosorbide Dinitrate 10 mg TID, metoprolol succinate XL 12. 5 mg QD, torsemide 40 mg BID, Lantus 50U QD. Was previously on Aldactone but discontinued given renal function. Dry weight is around 185 lb per 10/2022, weight today around 195-199 lb.     Follows with Dr. Mervin Kung of [facility] for CLL. Dx'd in 06/2020. Thought to be well controlled on acalabrutinib.     Follows with [facility] for CKD. Cr increased from 1.5 in 03/2022 to 2.3 in 09/2022. Previously on HD/UF in 2018.      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  He lives with his wife in [place], previously was living in [name] but due to high cost of living and exorbitant housing rates, he moved to [place].  He has 3 sons and 1 daughter, and 1 grandchild.  They all live in different parts of the [place] and no one is close by.  His primary caregiver is his wife       PMH:    Past Medical History:   Diagnosis Date    Acute CHF     NOV  & DEC 2018, - DIALYSIS CATH PLACED Aug 24 2017 TO REMOVE FLUID     Acute systolic  (congestive) heart failure 08/2017    Anxiety     CAD (coronary artery disease)     Cardiomyopathy     nonischemic    CHF (congestive heart failure) 08/12/2014, 2013    Chronic obstructive pulmonary disease     POSSIBLE PER PT HE USES  SYNBICORT BID ABD  BREO INHALER PRN    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    Coronary artery disease 2011    CORONARY STENT PLACEMENT FOLLOWED BY [facility]    Depression     echocardiogram 02/2016, 11/2016, 09/2017, 12/20/2017    GERD (gastroesophageal reflux disease)     Heart  attack 2011    and 2014    Hyperlipemia     Hypertension     ICD (implantable 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     PLACED 2014  [facility]  LAST INTERROGATION April 01 2018 REPORT REQUESTED    Ischemic cardiomyopathy     EF 15% ON ECHO 09-20-2017 DR GARG IN EPIC    Nonischemic cardiomyopathy     NSTEMI (non-ST elevated myocardial infarction)     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    Pacemaker 2014    MEDTRONIC ICD/PACEMAKER COMBO LAST INTERROGATION  12-12 2018    Pneumonia 09/2016    Primary cardiomyopathy     Ischemic cardiomyopathy EF 15% ON ECHO 09-20-2017 DR GARG IN EPIC    Sepsis 08/2017    Syncope and collapse     PRIOR TO ICD/PACEMAKER PLACED 2014    Type 2 diabetes mellitus, controlled DX 1998    BS  AVG 100  A1C 7.4 Dec 27 2017    [place]     PT SEE HIS PMD April 01 2018 RE THIS-TOLD TO SEE PMD BY [facility] JUNE 12 WHEN THEY SAW HIM FOR A CL       PSH:    Past Surgical History:   Procedure Laterality Date    BIV  11/10/2016    ICD [place]  03/2010    CORONARY STENT PLACEMENT X 2 PER PT; LM normal, LAD 70-80% distal lesion at apex, 20% lesion mid CFX    CARDIAC CATHETERIZATION  12/2012    CARDIAC DEFIBRILLATOR PLACEMENT  12/2012    [facility]    CARDIAC PACEMAKER PLACEMENT  2014    MEDTRONIC ICD/PACEMAKER PLACED [facility]    CIRCUMCISION  AGE 89    COLONOSCOPY  2009    CORRECTION HAMMER TOE      duodenal ulcer  1973    STRESS RELATED IN SCHOOL    ECHOCARDIOGRAM, TRANSTHORACIC   02/2016 11/2016 09/2017 12/2017    ECHOCARDIOGRAM, TRANSTHORACIC  02/2016,11/2016,12/20/2017    EGD N/A 08/15/2014    Procedure: EGD;  Surgeon: Loraine Maple, MD;  Location: [facility];  Service: Gastroenterology;  Laterality: N/A;  egd w/ bx    EGD  1975    EGD, COLONOSCOPY N/A 04/04/2018    Procedure: EGD with bxs, COLONOSCOPY with polypectomy and clipping;  Surgeon: Omer Jack, MD;  Location: [facility];  Service: Gastroenterology;  Laterality: N/A;    FRACTURE SURGERY  AGE 77     RT  ANKLE- CAST APPLIED    HERNIA REPAIR  AGE 89    LEFT INGUINAL HERNIA    MPI nuclear study  06/2016    orthopedic surgery  AGE 56    RT foot corrective - HAMMERTOE    RIGHT HEART CATH Right 09/09/2022    Procedure: Right Heart Cath;  Surgeon: Wandra Arthurs, MD;  Location: [facility];  Service: Cardiovascular;  Laterality: Right;  tx from [place] INCL. CO AND SATS Right 12/22/2022    Procedure: Right Heart Cath ONLY incl. CO and Sats;  Surgeon: Day, Lillette Boxer, MD;  Location: [facility];  Service: Cardiovascular;  Laterality: Right;  Right Heart Cath with leave in [place] cath for milrinone initiation    [place]       FH:  Family History   Problem Relation Age of Onset    Breast cancer Mother  Heart attack Mother 9    Hypertension Mother     Diabetes Mother     Coronary artery disease Mother     Diabetes Brother     Heart attack Father     Hypertension Father        Social History:  Social History     Socioeconomic History    Marital status: Married     Spouse name: Architect    Number of children: 0    Years of education: Not on file    Highest education level: Not on file   Occupational History    Occupation: Pharmacist, 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[employer], history    Tobacco Use    Smoking status: Never    Smokeless tobacco: Never   Vaping Use    Vaping Use: Never used   Substance and Sexual Activity    Alcohol use: Not Currently    Drug use: No    Sexual activity: Yes      Partners: Female   Other Topics Concern    Not on file   Social History Narrative    Not on file     Social Determinants of Health     Financial Resource Strain: Low Risk  (12/22/2022)    Overall Financial Resource Strain (CARDIA)     Difficulty of Paying Living Expenses: Not hard at all   Food Insecurity: No Food Insecurity (09/12/2022)    Hunger Vital Sign     Worried About Running Out of Food in the Last Year: Never true     [place] in the Last Year: Never true   Transportation Needs: No Transportation Needs (12/22/2022)    PRAPARE - Armed forces logistics/support/administrative officer (Medical): No     Lack of Transportation (Non-Medical): No   Physical Activity: Not on file   Stress: Not on file   Social Connections: Not on file   Intimate Partner Violence: Not At Risk (09/12/2022)    Humiliation, Afraid, Rape, and Kick questionnaire     Fear of Current or Ex-Partner: No     Emotionally Abused: No     Physically Abused: No     Sexually Abused: No   Housing Stability: Low Risk  (12/22/2022)    Housing Stability Vital Sign     Unable to Pay for Housing in the Last Year: No     Number of Places Lived in the Last Year: 1     Unstable Housing in the Last Year: No       Allergies:  Allergies   Allergen Reactions    Allopurinol        Home medications:   Prior to Admission medications    Medication Sig Start Date End Date Taking? Authorizing Provider   apixaban (ELIQUIS) 5 MG Take 1 tablet (5 mg) by mouth every 12 (twelve) hours For 1 week followed by 1 tablet twice daily 09/14/22   Irma Newness, MD   aspirin EC 81 MG EC tablet Take 1 tablet (81 mg) by mouth daily    [provider]   atorvastatin (LIPITOR) 40 MG tablet Take 1 tablet (40 mg total) by mouth nightly 07/16/21   Carlis Abbott, MD   Calquence 100 MG Tab  04/07/22   [provider]   hydrALAZINE (APRESOLINE) 25 MG tablet Take 1 tablet (25 mg) by mouth every 8 (eight) hours 09/14/22 12/13/22  Irma Newness, MD   Insulin Glargine-yfgn (Semglee,  yfgn,) 100 UNIT/ML Solution Pen-injector 50  Units by Subdermal route daily 10/26/22   Clearance Coots, MD   isosorbide dinitrate (ISORDIL) 10 MG tablet Take 1 tablet (10 mg) by mouth 3 (three) times daily 09/14/22 12/13/22  Irma Newness, MD   Jardiance 10 MG tablet TAKE 1 TABLET(10 MG) BY MOUTH DAILY 06/05/22   Clotilde Dieter, NP   loratadine (CLARITIN) 10 MG tablet Take 1 tablet (10 mg) by mouth daily 04/18/22   Regis Bill, MD   metoprolol succinate XL (TOPROL-XL) 25 MG 24 hr tablet Take 0.5 tablets (12.5 mg) by mouth daily 05/22/22   Clotilde Dieter, NP   niacin (NIASPAN) 500 MG CR tablet TAKE 1 TABLET BY MOUTH TWICE DAILY  Patient taking differently: 250 mg Taking half a tablet 02/23/22   Clearance Coots, MD   torsemide (DEMADEX) 20 MG tablet Take 2 tablets (40 mg) by mouth 2 (two) times daily 09/14/22   Irma Newness, MD        Inpatient/ER medications:  Medications Prior to Admission   Medication Sig    apixaban (ELIQUIS) 5 MG Take 1 tablet (5 mg) by mouth every 12 (twelve) hours For 1 week followed by 1 tablet twice daily    aspirin EC 81 MG EC tablet Take 1 tablet (81 mg) by mouth daily    atorvastatin (LIPITOR) 40 MG tablet Take 1 tablet (40 mg total) by mouth nightly    Calquence 100 MG Tab     hydrALAZINE (APRESOLINE) 25 MG tablet Take 1 tablet (25 mg) by mouth every 8 (eight) hours    Insulin Glargine-yfgn (Semglee, yfgn,) 100 UNIT/ML Solution Pen-injector 50 Units by Subdermal route daily    isosorbide dinitrate (ISORDIL) 10 MG tablet Take 1 tablet (10 mg) by mouth 3 (three) times daily    Jardiance 10 MG tablet TAKE 1 TABLET(10 MG) BY MOUTH DAILY    loratadine (CLARITIN) 10 MG tablet Take 1 tablet (10 mg) by mouth daily    metoprolol succinate XL (TOPROL-XL) 25 MG 24 hr tablet Take 0.5 tablets (12.5 mg) by mouth daily    niacin (NIASPAN) 500 MG CR tablet TAKE 1 TABLET BY MOUTH TWICE DAILY (Patient taking differently: 250 mg Taking half a tablet)    torsemide (DEMADEX) 20 MG tablet Take 2 tablets (40 mg) by mouth 2  (two) times daily       Current Inpatient :  Current Facility-Administered Medications   Medication Dose Route Frequency    amiodarone  400 mg Oral TID    aspirin EC  81 mg Oral Daily    atorvastatin  40 mg Oral QHS    heparin (porcine)  5,000 Units Subcutaneous Q8H [place]    hydrALAZINE  100 mg Oral Q8H [place]    insulin glargine  33 Units Subcutaneous Q12H    Or    insulin glargine  15 Units Subcutaneous Q12H    insulin lispro  1-6 Units Subcutaneous QHS    insulin lispro  10 Units Subcutaneous TID AC    insulin lispro  2-10 Units Subcutaneous TID AC    isosorbide dinitrate  20 mg Oral TID - Nitrate Free Interval    niacin  500 mg Oral BID    polyethylene glycol  17 g Oral Daily    senna-docusate  1 tablet Oral Q12H [facility]      bumetanide 2 mg/hr (12/28/22 [id number])    DOBUTamine 7.5 mcg/kg/min (12/28/22 0506)         Objectives:       ROS:  No constitutional complaints, no  respiratory complaints, no musculoskeletal complaints, or other pertinent review of symptoms except as noted in the history of present illness.    Vitals:  Vitals:    12/28/22 0500   BP:    Pulse: 84   Resp: (!) 84   Temp:    SpO2: 94%        Intake / Output:    Intake/Output Summary (Last 24 hours) at 12/28/2022 0624  Last data filed at 12/28/2022 0400  Gross per 24 hour   Intake 1734.19 ml   Output 675 ml   Net 1059.19 ml          Weight:      12/27/2022    11:00 PM 12/27/2022    11:40 PM 12/28/2022     4:15 AM   Weight Monitoring   Height 182.9 cm 182.9 cm 182.9 cm   Height Method Stated     Weight 96 kg     Weight Method Bed Scale     BMI (calculated) 28.7 kg/m2          Weight change: 3.6 kg (7 lb 15 oz)     Physical Exam:  General: Healthy-appearing, in no distress.  Alert and oriented x 3  Eyes: No xanthelasma.  Oropharynx: No mucosal pallor.  Neck: Trachea midline, JVD up to the ear  Pulmonary: crackles at the bases  Cardiovascular: PMI nondisplaced with normal rate and rhythm, normal S1, normal S2, holosystolic murmur   Abdomen: Soft,  nontender.  Extremities: no edema, pulses present bilaterally  Skin: multiple excoriations with visible venous small oozing of blood.    Psych:  Mood was normal    POCUS:  LV: severely dilated with reduced function (visually estimated EF 10% range) and LVIDd 7.0 cm  RV: severe dilated with reduced annular escursion (TAPSE: 1.2 cm RV S' 6 cm/s)  Atria: biatrial enlargement  Aortic valve: no significant AI visualized, valve appears to open well  Pulmonary Valve: not well visualized  Mitral Valve: likely ventricular functional mitral regurgitation at least moderate at color doppler  Tricuspid valve: likely severe TR in the presence of pacemaker crossing the TV  IVC: dilated to 2.5 cm non compressible  Pericardium: trace pericardial effusion    Pacemaker interrogation:  PPM Interrogation    Device type CRT-D device   Manufacturer Medtronic   Model Claria MRI Quad   Implant date 11/10/2016 (Dr. Efrain Sella)   Indication  Resynchronization and primary prevention     Presenting rhythm:  A-sensed, V-paced  Underlying rhythm:  sinus rhythm with LBBB    Leads  RA 1.50 V @ 0.40 ms 0.30 mV 342 ohms   RV 2.00 V @ 0.40 ms 0.30 mV 285 ohms   LV 2.25 V @ 0.40 ms  513 ohms     Diagnostic Data  A-paced 1.2 %   V-paced 93.6 % (91.9% effective)     High rate episodes  A-episodes None recently   V-episodes Few short NSVT, longer of which was 15 seconds long on December 25 2022     Battery status:  satisfactory (remains 19 months)    Final Parameters:  Mode  DDDR    Lower-Upper Rate 60 - 171 bpm    VT1 <171 bpm Observe only   VT 171 -200 bpm OFF   VF >200bpm shock     Notes / Changes made:  No changes were made           EKG:  A sensed V paced rhythm with normal  QRS    Labs:  CBC        12/28/22  0351 12/27/22  0410 12/26/22  0409   WBC 6.89 6.39 5.77   Hgb 8.9* 9.6* 10.0*   Hematocrit 26.8* 29.4* 30.3*   Platelets 69* 67* 65*         BMP        12/28/22  0351 12/27/22  2215 12/27/22  2023 12/27/22  1706 12/27/22  0410 12/26/22  1705  12/26/22  1141 12/25/22  1714 12/25/22  0328 12/24/22  1513 12/24/22  0912   BUN 56.0*  --   --  52.0* 51.0*   < >  --    < > 42.0*   < >  --    Creatinine 4.6*  --   --  4.1* 3.5*   < >  --    < > 2.7*   < >  --    eGFR 12.5*  --   --  14.3* 17.3*   < >  --    < > 23.7*   < >  --    Sodium 131*  --   --  132* 131*   < >  --    < > 134*   < >  --    Potassium 3.7  --   --  3.9 4.2   < >  --    < > 3.6   < >  --    Chloride 94*  --   --  95* 94*   < >  --    < > 98*   < >  --    CO2 23  --   --  24 25   < >  --    < > 24   < >  --    Calcium 8.9  --   --  8.9 9.0   < >  --    < > 9.0   < >  --    Magnesium 2.1  --   --  2.2 2.1   < >  --    < >  --   --   --    Phosphorus  --   --   --   --   --   --   --   --  2.9  --   --    Lactic Acid 0.9 1.5 2.9*  --  1.5   < > 2.3*   < > 1.3   < > 1.6   NT-proBNP  --   --   --   --   --   --  9,403*  --   --   --   --    LDH  --   --   --   --   --   --   --   --   --   --  507*    < > = values in this interval not displayed.         LFTs        12/28/22  0351 12/27/22  0410 12/26/22  0409   AST (SGOT) 35 43* 49*   ALT 108* 145* 174*   Alkaline Phosphatase 64 75 72   Bilirubin, Total 1.9* 1.7* 1.7*   Bilirubin Direct 0.8* 0.8* 0.8*   Bilirubin Indirect 1.1* 0.9 0.9         Coags        12/27/22  0410 12/26/22  2024 12/26/22  0409 12/25/22  0328 12/24/22  1112 12/24/22  0301 12/23/22  1713 12/23/22  1200   PT  --   --   --   --  16.3*  --  17.1*  --    PT INR  --   --   --   --  1.4*  --  1.5*  --    PTT  --   --   --   --  102*  --  33 63*   Anti-Xa, UFH 0.09 0.46 <0.04   < > 0.59   < >  --   --     < > = values in this interval not displayed.       Microbiology  Microbiology Results (last 15 days)       Procedure Component Value Units Date/Time    MRSA culture [id number] Collected: 12/23/22 1200    Order Status: Completed Specimen: Culturette from Nasal Swab Updated: 12/24/22 1311     Culture MRSA Surveillance Negative for Methicillin Resistant Staph aureus    MRSA culture  [id number] Collected: 12/23/22 1200    Order Status: Completed Specimen: Culturette from Throat Updated: 12/24/22 1311     Culture MRSA Surveillance Negative for Methicillin Resistant Staph aureus    Culture Blood Aerobic and Anaerobic [id number] Collected: 12/22/22 2239    Order Status: Completed Specimen: Blood, Venipuncture Updated: 12/28/22 0425    Narrative:      The order will result in two separate 8-58m bottles  Please do NOT order repeat blood cultures if one has been  drawn within the last 48 hours  UNLESS concerned for  endocarditis  AVOID BLOOD CULTURE DRAWS FROM CENTRAL LINE IF POSSIBLE  Indications:->Pneumonia  ORDER#: [id number]                                   ORDERED BY: GVear Clock SOURCE: Blood, Venipuncture                          COLLECTED:  12/22/22 22:39  ANTIBIOTICS AT COLL.:                                RECEIVED :  12/23/22 02:00  Culture Blood Aerobic and Anaerobic        FINAL       12/28/22 04:25  12/28/22   No growth after 5 days of incubation.      Culture Blood Aerobic and Anaerobic [[id number]: 12/22/22 2239    Order Status: Completed Specimen: Blood, Venipuncture Updated: 12/28/22 0425    Narrative:      The order will result in two separate 8-116mbottles  Please do NOT order repeat blood cultures if one has been  drawn within the last 48 hours  UNLESS concerned for  endocarditis  AVOID BLOOD CULTURE DRAWS FROM CENTRAL LINE IF POSSIBLE  Indications:->Pneumonia  ORDER#: [id number]                                  ORDERED BY: GAVear ClockSOURCE: Blood, Venipuncture                          COLLECTED:  12/22/22 22:39  ANTIBIOTICS AT COLL.:                                RECEIVED :  12/23/22 01:59  Culture Blood Aerobic and Anaerobic        FINAL       12/28/22 04:25  12/28/22   No growth after 5 days of incubation.      Respiratory Pathogen Panel w/COVID-19, PCR [id number]     Order Status: Canceled     COVID-19 (SARS-CoV-2) and Influenza A/B, NAA ([place]  Rapid)- Admission [id number] Collected: 12/21/22 1050    Order Status: Completed Specimen: Culturette from Nasopharyngeal Updated: 12/21/22 1144     Purpose of COVID testing Diagnostic -PUI     SARS-CoV-2 Specimen Source Nasal Swab     SARS CoV 2 Overall Result Not Detected     Comment: __________________________________________________  -A result of "Detected" indicates POSITIVE for the    presence of SARS CoV-2 RNA  -A result of "Not Detected" indicates NEGATIVE for the    presence of SARS CoV-2 RNA  __________________________________________________________  Test performed using the Roche cobas [place] SARS-CoV-2 assay. This assay is  only for use under the Food and Drug Administration's Emergency Use  Authorization. This is a real-time RT-PCR assay for the qualitative  detection of SARS-CoV-2 RNA. Viral nucleic acids may persist in vivo,  independent of viability. Detection of viral nucleic acid does not imply the  presence of infectious virus, or that virus nucleic acid is the cause of  clinical symptoms. Negative results do not preclude SARS-CoV-2 infection and  should not be used as the sole basis for diagnosis, treatment or other  patient management decisions. Negative results must be combined with  clinical observations, patient history, and/or epidemiological information.  Invalid results may be due to inhibiting substances in the specimen and  recollection should occur. Please see Fact Sheets for patients and providers  located:  https://www.benson-chung.com/          Influenza A Not Detected     Influenza B Not Detected     Comment: Test performed using the Roche cobas Liat SARS-CoV-2 & Influenza A/B assay.  This assay is only for use under the Food and Drug Administration's  Emergency Use Authorization. This is a multiplex real-time RT-PCR assay  intended for the simultaneous in vitro qualitative detection and  differentiation of SARS-CoV-2, influenza A, and influenza B virus RNA.  Viral  nucleic acids may persist in vivo, independent of viability. Detection of  viral nucleic acid does not imply the presence of infectious virus, or that  virus nucleic acid is the cause of clinical symptoms. Negative results do  not preclude SARS-CoV-2, influenza A, and/or influenza B infection and  should not be used as the sole basis for diagnosis, treatment or other  patient management decisions. Negative results must be combined with  clinical observations, patient history, and/or epidemiological information.  Invalid results may be due to inhibiting substances in the specimen and  recollection should occur. Please see Fact Sheets for patients and providers  located: [email or web address].[email or web address]         Narrative:      o Collect and clearly label specimen type:  o PREFERRED-Upper respiratory specimen: One Nasal Swab in  Transport Media.  o Hand deliver to laboratory ASAP  Diagnostic -PUI    Respiratory Pathogen Panel w/COVID-19, PCR [id number] Collected: 12/21/22 0106    Order Status: Canceled  Specimen: Nasopharyngeal     Culture Blood Aerobic and Anaerobic [id number] Collected: 12/21/22 0021    Order Status: Completed Specimen: Blood, Venipuncture Updated: 12/26/22 [id number]    Narrative:      Indications:->Other  Other->infection  ORDER#: [id number]                                    ORDERED BY: LEE, SIEW MEI  SOURCE: Blood, Venipuncture RAC                      COLLECTED:  12/21/22 00:21  ANTIBIOTICS AT COLL.:                                RECEIVED :  12/21/22 04:06  Culture Blood Aerobic and Anaerobic        FINAL       12/26/22 06:37  12/26/22   No growth after 5 days of incubation.      Culture Blood Aerobic and Anaerobic [id number] Collected: 12/21/22 0021    Order Status: Completed Specimen: Blood, Venipuncture Updated: 12/26/22 1147    Narrative:      [id number] called Micro Results of Pos Bld. Results read back [id number], by [id number]  on 12/22/2022 at  07:29  Indications:->Other  Other->infection  ORDER#: [id number]                                    ORDERED BY: LEE, SIEW MEI  SOURCE: Blood, Venipuncture LAC                      COLLECTED:  12/21/22 00:21  ANTIBIOTICS AT COLL.:                                RECEIVED :  12/21/22 04:06  [id number] called Micro Results of Pos Bld. Results read back by:[id number], by [phone] on 12/22/2022 at 07:29  Culture Blood Aerobic and Anaerobic        FINAL       12/26/22 11:47   +  12/22/22   Aerobic Blood Culture Positive in 24 to 48 hours             Gram Stain Shows: Gram positive cocci in clusters  12/26/22   Anaerobic Blood Culture No Growth  12/23/22   Growth of Staphylococcus (coagulase negative)               Possible skin contaminant, susceptibility testing not             performed without request unless additional blood cultures,             collected within 48 hours of this culture, become positive.             Contact the laboratory for further information if necessary.        MRSA culture [id number] Collected: 12/21/22 0000    Order Status: Canceled Specimen: Culturette from Nasal Swab     MRSA culture [id number] Collected: 12/21/22 0000    Order Status: Canceled Specimen: Culturette from Throat               Cardiac Studies:  Echocardiogram: 12/21/22    * Limited echo to  evaluate biventricular function.    * The left ventricle is moderately dilated with LVIDd 6.8 cm.    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    * Severe global hypokinesis with regional variation.    * The right ventricular cavity size is mildly dilated.    * Moderately decreased right ventricular systolic function.    * There is severe mitral regurgitation which is functional secondary to  dilated left ventricle.    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    * Compared to  the prior study dated 09/08/22, the EF remains severely  reduced and there is more significant mitral and tricuspid regurgitation.      Right Heart Catheterization:   12/27/22 at bedside  CVP  20 mmHg  RV  55/18  PA  56/20 (34)  PCWP  24 with V 28  TC CO/CI 7.0/3.1  SvO2  66.9%  Fick CO/CI 7.3/3.3  PVR  <2      Imaging:  Radiology Results (24 Hour)       Procedure Component Value Units Date/Time    XR Chest AP Portable [id number] Collected: 12/27/22 2344    Order Status: Completed Updated: 12/27/22 2347    Narrative:      HISTORY: PICC position     COMPARISON: 12/24/2020    FINDINGS:     Indwelling tubes and lines: Right IJ PA catheter tip is relatively stable  in positioning projecting over the right lower lobar pulmonary artery.    Left chest wall ICD.    Lungs: Hypoinflated lungs leading to vascular parenchymal crowding. No  focal opacity.    Pleura: No significant pleural effusion. No appreciable pneumothorax.    Mediastinum: Stable cardiomegaly.     Bones: No acute or aggressive osseous abnormality.      Impression:           Stable positioning of right IJ PA catheter projecting over the right lower  lobar pulmonary artery.    Blanchard Mane  12/27/2022 11:45 PM    XR Chest AP Portable [id number] Resulted: 12/27/22 2330    Order Status: Sent Updated: 12/27/22 2332

## 2022-12-28 NOTE — Progress Notes (Signed)
Case Management Progress Note     Date: 03/11/20242    Patient: [facility] Problems    Diagnosis    Iron deficiency anemia secondary to inadequate dietary iron intake    Heart failure    Heart failure, unspecified HF chronicity, unspecified heart failure type    Ischemic cardiomyopathy    Acute on chronic right-sided heart failure    Chronic systolic congestive heart failure    Nonischemic cardiomyopathy    Non-ischemic cardiomyopathy       Length of stay: Hospital Day 7        Discharge plan:  Home with family, family to transport.    Anticipated date of discharge: TBD.    Barriers to discharge: Continuous Bumex gtt, continuous Dobutamine, heparin.    CM continuing to peripherally follow for discharge needs.     Lake Bells BSN, RN  [facility] Management  RN Case Manager I  [address]  [place], [place] [address]

## 2022-12-28 NOTE — Progress Notes (Incomplete)
AFTERNOON ROUNDS:     FICK:     I/O:  -61.22 (776 urine made)  -got 4 bumex, '2mg'$ /hr bumx, 500 diurel      bumetanide 2 mg/hr (12/28/22 1300)    DOBUTamine 5 mcg/kg/min (12/28/22 1300)

## 2022-12-28 NOTE — Progress Notes (Signed)
[facility].  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  Comfortable respiratory status.  Told him he may need RRT over coming days if renal indices were to worsen   - continues on bumex drip 2/hr  - daily BMP, strict ins and outs  - dobutamine per cardiology  -     Hassie Amr, MD  Epic Chat Direct (preferred) or  Epic Group: [facility]  [facility] Spectra: [phone],   Office: [phone],     ++++++++++++++++++++++++++++++++++++++++++++++++++++++++++++++    Subjective:  Remains in CICU  UOP 1L with additional voids  - on dobutamine and bumex drips    Medications:  Scheduled Meds:  Current Facility-Administered Medications   Medication Dose Route Frequency    amiodarone  400 mg Oral TID    aspirin EC  81 mg Oral Daily    atorvastatin  40 mg Oral QHS    heparin (porcine)  5,000 Units Subcutaneous Q8H [place]    hydrALAZINE  100 mg Oral Q8H [place]    insulin glargine  33 Units Subcutaneous Q12H    Or    insulin glargine  15 Units Subcutaneous Q12H    insulin lispro  1-6 Units Subcutaneous QHS    insulin lispro  10 Units Subcutaneous TID AC    insulin lispro  2-10 Units Subcutaneous TID AC    isosorbide dinitrate  20 mg Oral TID - Nitrate Free Interval    niacin  500 mg Oral BID    polyethylene glycol  17 g Oral Daily    senna-docusate  1 tablet Oral Q12H [place]     Continuous Infusions:   bumetanide 2 mg/hr (12/28/22 0800)    DOBUTamine 7.5 mcg/kg/min (12/28/22 0800)     PRN Meds:acetaminophen, dextrose **OR** dextrose **OR** dextrose **OR** glucagon (rDNA), phenol    Objective:  Vital signs in last 24  hours:  Temp:  [97.7 F (36.5 C)-98.4 F (36.9 C)] 98.4 F (36.9 C)  Heart Rate:  [78-93] 93  Resp Rate:  [17-84] 58  BP: (101-114)/(50-66) 108/56  Arterial Line BP: (106-133)/(41-53) 112/53  Intake/Output last 24 hours:    Intake/Output Summary (Last 24 hours) at 12/28/2022 [id number]  Last data filed at 12/28/2022 0800  Gross per 24 hour   Intake 2111.22 ml   Output 1235 ml   Net 876.22 ml     Intake/Output this shift:  I/O this shift:  In: 328.9 [P.O.:300; I.V.:28.9]  Out: 225 [Urine:225]    Physical Exam:   Gen: No acute distress   HEENT: normocephalic   CV: S1 S2 No rub   Chest: clear anteriorly, decrease toward bases bilaterally   Ab: Nondistended,  soft    Ext: + leg edema    Labs:  Recent Labs   Lab 12/28/22  0351 12/27/22  1706 12/27/22  0410 12/26/22  1705 12/26/22  0409 12/25/22  1714 12/25/22  0328 12/22/22  0424 12/21/22  2247 12/21/22  1837   Glucose 91 168* 263*  More results in Results Review 284*  More results in Results Review 283*  More results in Results Review 199* 167*   BUN 56.0* 52.0* 51.0*  More results in Results Review 41.0*  More results in Results Review 42.0*  More results in Results Review 61.0* 60.0*   Creatinine 4.6* 4.1* 3.5*  More results in Results Review 2.7*  More results in Results Review 2.7*  More results in Results Review 4.2* 4.1*   Calcium 8.9 8.9 9.0  More results in Results Review 9.1  More results in Results Review 9.0  More results in Results Review 8.7 8.9   Sodium 131* 132* 131*  More results in Results Review 135  More results in Results Review 134*  More results in Results Review 135 135   Potassium 3.7 3.9 4.2  More results in Results Review 3.8  More results in Results Review 3.6  More results in Results Review 4.9 4.8   Chloride 94* 95* 94*  More results in Results Review 97*  More results in Results Review 98*  More results in Results Review 101 103   CO2 '23 24 25  '$ More results in Results Review 25  More results in Results Review 24  More results in Results Review  17 19   Albumin 3.0*  --  3.1*  --  3.0*  More results in Results Review 3.0*  More results in Results Review 3.1*  --    Phosphorus  --   --   --   --   --   --  2.9  --  7.2* 7.4*   Magnesium 2.1 2.2 2.1  More results in Results Review 2.0  --   --   More results in Results Review 2.9* 2.8*   More results in Results Review = values in this interval not displayed.     Recent Labs   Lab 12/28/22  0351 12/27/22  0410 12/26/22  0409   WBC 6.89 6.39 5.77   Hgb 8.9* 9.6* 10.0*   Hematocrit 26.8* 29.4* 30.3*   MCV 98.2* 101.4* 99.7*   MCH 32.6 33.1 32.9   MCHC 33.2 32.7 33.0   RDW 17* 16* 16*   MPV 11.4 12.3 12.1   Platelets 69* 67* 65*

## 2022-12-28 NOTE — Progress Notes (Addendum)
Cardiac Intensive Care Unit (CICU)  Progress Note      Patient Name: [name] [name]  MRN: [id number]  Room: FI103/FI103-01    ICU Day: 13h    I was present with the medical student while the E/M service was performed. I have performed (or re-performed) the physical exam and medical decision making documented by the student. I agree with and have verified that the student documentation is accurate, or have edited the note.      Vanessa Ralphs., DO  [facility], Internal Medicine, PGY-2      Interval Events     24 hour events:   -Readmitted to CICU       Subjective     [name] [name] states he is feeling well this morning. He denies any pain except at his PA cath insertion site, which is relieved by tylenol. He is peeing well and denies any pain with peeing or blood in his urine. He did not sleep well last night and would like some melatonin. He denies CP, palpitations, SOB. He is eating about 60% of his meals and having regular BMs.     Hospital Course:    [name] [name] 77 y.o. 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     Patient was then transferred to [facility] on new dobutamine 2.5 mcg/kg/min for LVAD consideration. ECHO on 3/4 found to have EF of 11% with severe global hypokinesis. He was diuresed appropriately and then transferred to the medicine floor on dobutamine at 5. Due to increased lactic acidosis, his dobutamine was increased on the floor to 10. His lactate began to worsen with decreasing urine output and rising creatinine. He was subsequently readmitted to the CICU for treatment of cardiogenic shock.     3/4:  RHC placed.   3/5: Diuresis, good UOP.   3/6: Started vanc and zosyn. Blood culture positive, repeat sent. RML possible infiltrate on CXR. CI: 1.7 overnight, lactate cleared. Wife to bring in CLL medication.   3/7: Improving CI overnight. Occasional 8 beat runs once or twice per shift. Antibiotics discontinued.   3/8: Lasix 120 at 9pm last night. Reports of Swan issues, some higher PAP due to line issues.  Cardiac index stabile, lactic acid clear  3/10: Lactate up to 2.7 with decreasing UOP and Cr up to 4.6. Readmitted to the CICU.   3/11: Continued to diurese. Dobutamine decreased to 5.     Home medications include Eliquis 2.5 mg BID, ASA 81 mg QD, Lipitor 40 mg QHS, Jardiance 10 mg QD, Hydralazine 25 mg Q8H, Isosorbide Dinitrate 10 mg TID, metoprolol succinate XL 12. 5 mg QD, torsemide 40 mg BID, Lantus 50U QD. Was previously on Aldactone but discontinued given renal function. Dry weight is around 185 lb per 10/2022       Assessment & Plan   Assessment and Plan   Summary: [name] [name] a 77 y.o. male with PMHx NICM/ICM s/p Medtronik ICD, CAD s/p PCI with DES, T2DM, stage IV CLL, CKD, RLE DVT on home Eliquis admitted to [facility] for SVT and transferred to [facility] for LVAD evaluation where diuresis and cardiac  medications were optimized, and pt was downgraded to the medicine floor on 3/8, with subsequent readmission to CICU on 3/10 for worsening renal function and rising lactate.     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 Hold parameters changed to systolic [id number].  -MCS: Medtronik ICD  -Advanced Therapies: undergoing eval for LVAD, delayed by worsening renal function      # Heart Failure   Diurese as above  GDMT:  Beta Blockers: hold in the setting of shock  ACEi/ARB/ARNI: hold in the setting of worsening kidney function  MRA: hold in the setting of worsening kidney function  SGLT2i: hold in the setting of worsening kidney function  # CAD s/p DES 2011  Continue aspirin '81mg'$  daily   Continue atorvastatin '40mg'$     # NSVT s/p unsuccessful cardioversion 3/4   Converted to NSR s/p adenosine.   Amiodarone '400mg'$  TID PO (last dose tomorrow then transition to '200mg'$  QD dosing)    # RLE DVT on home Eliquis  Therapeutic heparin for DVT held in the setting of thrombocytopenia  and bleeding from insertion sites    MAP goal 65-75     PULMONARY:   No acute concerns    Last vent settings if on vent:     Ventilator Time (if on vent):       Supplemental oxygen as needed to keep sat > 92%   RENAL:   Acute on chronic renal failure      Intake/Output Summary (Last 24 hours) at 12/28/2022 1151  Last data filed at 12/28/2022 1000  Gross per 24 hour   Intake 2169.02 ml   Output 1385 ml   Net 784.02 ml      # Acute on Chronic Kidney Disease   CKD 3B; baseline serum creatinine around 2.5 mg/dL  Nephrology following:             -No indication for RRT at this time   GASTROINTESTINAL:  No acute concerns    Last Bowel Movement:   Last BM Date: 12/28/22   Nutrition: Heart Healthy diet changed to regular diet to promote caloric intake  Bowel Regimen: miralax and senna,  regular BMs   ALT 108 3/11, CTM     INFECTIOUS DISEASE:   No acute concerns     Temp (24hrs), Avg:98.2 F (36.8 C), Min:97.9 F (36.6 C), Max:98.4 F (36.9 C)     * No values recorded  between 12/25/2022  2:07 PM and 12/28/2022 11:51 AM *  Lab Results   Component Value Date    WBC 6.89 12/28/2022      CAUTI Prevention (n/a if blank):  Foley Day:         Blood Steam Infection Prevention (n/a if blank):   Implanted Catheter 12/25/22 Hohn Tunneled Right Internal jugular  Peripheral Arterial Line 12/27/22 Right Radial  Pulmonary Artery Catheter 12/28/22 Internal jugular   Last culture dates: blood cultures 12/23/22 TGTD x5 days           HAI Prevention Plan  Initiate/Maintain Central Line (CVC/PICC) for titration of cardiac medications       HEME/ONC:   Chronic Lymphocytic Leukemia  Thrombocytopenia     # Thrombocytopenia   -Unclear etiology.  -Nutritional studies and hemolysis labs normal, HIT Ab negative, SRA in process.   -Per Oncology team: "Suspect likely due to acute illness with cardiogenic shock and AKI necessitating inotropic support, less likely immune thrombocytopenia in the setting of CLL given overall good control of disease."  Heme-Onc following  CTM  Plt count >50k in the absence of bleeding   Therapeutic heparin for DVT held in the setting of thrombocytopenia     # Chronic Lymphocytic Leukemia  Initiated on acalabrutinib 04/2022 for worsening leukocytosis and adenopathy.  Acalabrutinib held in the setting of thrombocytopenia  Heme-Onc following         SCDs  DVT chemoprophylaxis: heparin (porcine) injection 5,000 Units      ENDO/RHEUM:  Type II Diabetes   Goal glucose range 100 - 180 WNL 3/11       Current Infusions (n/a if blank):    bumetanide 2 mg/hr (12/28/22 1000)    DOBUTamine 5 mcg/kg/min (12/28/22 1145)          Advance Care Planning: Open ACP Navigator  Code Status: Full Code  Next of Kin:  Primary Emergency Contact: Siddique,Octavia           ADVANCE CARE PLAN                     Objective       Examination:   Temp:  [97.9 F (36.6 C)-98.4 F (36.9 C)] 98.4 F (36.9 C)  Heart Rate:  [78-93] 87  Resp Rate:  [17-84] 23  BP: (101-114)/(50-66) 108/56  Arterial Line BP: (106-136)/(41-58) 136/58  SpO2: 94-99% on RA  Maps: 70-80    Tele:   -1 episode NSVT at 7:30am (5 beats)    Neuro:    alert & oriented x 3 , appropriate mood and affect    Lungs:   clear to auscultation, no wheezes, rales or rhonchi, symmetric air entry     Cardiac:   normal rate, regular rhythm, normal S1, S2, no murmurs, rubs, clicks or gallops, JVD 15cmH2O    Abdomen:    soft, nontender, nondistended, no masses or organomegaly     Extremities:   peripheral pulses normal no pedal edema    Skin:     Warm, dry and intact. Incisions clean and dry with no pus or drainage.    Fick/Cardiac Hemodynamics   VO2 Determination  Height: 182.9 cm (6' 0.01")  Weight FICK (only): 95 kg (209 lb 7 oz)  BSA (Fick only) (Calculated - sq m): 2.2 sq meters  VO2: 275    CI (L/min/m2): 3.2 L/min/m2    CO/CI Determination  SaO2 : 0.98  SvO2 : 0.62  SaO2 - SvO2: 0.36  HGB Result: 8.9  x 1.36 x 10: 43.57  Cardiac Output (FICK): 6.31  Cardiac Index (FICK): 2.87  Cardiac Power Output: 1.06    PVR Determination  PAP: 69/23  PAP (Mean): 39 mmHg  PAWP (mmHg): 18 mmHg  Pulmonary Artery Pulsatility Index (PAPI): 2.3    SVR Determination  MAP (mmHg): 76  CVP (mmHg): 20 mmHg  MAP - CVP: 55  SVR (dynes/second/cm3): 697.31 dynes/second/cm3       Most Recent Labs  6.89 \ 8.9* / 69*    / 26.8* \    03/11 0351    130* 93* 57.0* / 168*   3.9 24 4.6* \    03/11 1007        -Lactate 0.9  -AST 35, ALT 108, bilib total 1.9   -VBG 2200 3/11: pH 7.45, pCO2 39.2, pO2 33.9        Additional Diagnoses:     Patient has BMI=Body mass index is 28.7 kg/m.  Diagnosis: Overweight based on BMI criteria          Recent Labs     12/28/22  0351 12/27/22  0410 12/26/22  0409 12/25/22  1714   Platelets 69* 67* 65* 69*     Diagnosis: Moderate Thrombocytopenia             Recent Labs      12/28/22  1007 12/28/22  0351 12/27/22  1706 12/27/22  0410 12/26/22  1705 12/26/22  0409 12/25/22  1714   Sodium 130* 131* 132* 131* 134* 135 134*     Diagnosis: No additional diagnosis              Recent Labs   Lab 12/28/22  0351 12/27/22  0410 12/26/22  0409   Hgb 8.9* 9.6* 10.0*   Hematocrit 26.8* 29.4* 30.3*   MCV 98.2* 101.4* 99.7*   WBC 6.89 6.39 5.77   Platelets 69* 67* 65*     Recent Labs   Lab 12/24/22  1513 12/24/22  0912 12/22/22  1430 12/21/22  2247   Ferritin  --   --  57.00  --    Iron  --   --   --  32*   TIBC  --   --   --  307   Iron Saturation  --   --   --  10*   Folate 12.6  --   --   --    Haptoglobin  --  165  --   --      Anemia Diagnosis: No new diagnosis

## 2022-12-29 ENCOUNTER — Encounter: Payer: Self-pay | Admitting: Diagnostic Radiology

## 2022-12-29 DIAGNOSIS — F3289 Other specified depressive episodes: Secondary | ICD-10-CM

## 2022-12-29 LAB — MAGNESIUM
Magnesium: 2.2 mg/dL (ref 1.6–2.6)
Magnesium: 2.3 mg/dL (ref 1.6–2.6)
Magnesium: 2.4 mg/dL (ref 1.6–2.6)
Magnesium: 2.4 mg/dL (ref 1.6–2.6)

## 2022-12-29 LAB — LACTIC ACID
Lactic Acid: 0.8 mmol/L (ref 0.2–2.0)
Lactic Acid: 1.3 mmol/L (ref 0.2–2.0)
Lactic Acid: 1.1 mmol/L (ref 0.2–2.0)
Lactic Acid: 1.1 mmol/L (ref 0.2–2.0)

## 2022-12-29 LAB — CBC AND DIFFERENTIAL
Absolute NRBC: 0.02 10*3/uL — ABNORMAL HIGH (ref 0.00–0.00)
Hematocrit: 27.3 % — ABNORMAL LOW (ref 37.6–49.6)
Hgb: 9 g/dL — ABNORMAL LOW (ref 12.5–17.1)
Instrument Absolute Neutrophil Count: 3.57 10*3/uL (ref 1.10–6.33)
MCH: 33.1 pg (ref 25.1–33.5)
MCHC: 33 g/dL (ref 31.5–35.8)
MCV: 100.4 fL — ABNORMAL HIGH (ref 78.0–96.0)
MPV: 11.6 fL (ref 8.9–12.5)
Nucleated RBC: 0.3 /100 WBC — ABNORMAL HIGH (ref 0.0–0.0)
Platelets: 78 10*3/uL — ABNORMAL LOW (ref 142–346)
RBC: 2.72 10*6/uL — ABNORMAL LOW (ref 4.20–5.90)
RDW: 17 % — ABNORMAL HIGH (ref 11–15)
WBC: 7.51 10*3/uL (ref 3.10–9.50)

## 2022-12-29 LAB — COOXIMETRY PROFILE
Carboxyhemoglobin: 1.9 % (ref 0.0–3.0)
Carboxyhemoglobin: 2.2 % (ref 0.0–3.0)
Carboxyhemoglobin: 2.6 % (ref 0.0–3.0)
Carboxyhemoglobin: 1.7 % (ref 0.0–3.0)
Hematocrit Total Calculated: 28.5 % — ABNORMAL LOW (ref 40.0–54.0)
Hematocrit Total Calculated: 29.4 % — ABNORMAL LOW (ref 40.0–54.0)
Hematocrit Total Calculated: 28.9 % — ABNORMAL LOW (ref 40.0–54.0)
Hematocrit Total Calculated: 28.3 % — ABNORMAL LOW (ref 40.0–54.0)
Hemoglobin Total: 9.2 g/dL — ABNORMAL LOW (ref 13.0–17.0)
Hemoglobin Total: 9.5 g/dL — ABNORMAL LOW (ref 13.0–17.0)
Hemoglobin Total: 9.3 g/dL — ABNORMAL LOW (ref 13.0–17.0)
Hemoglobin Total: 9.1 g/dL — ABNORMAL LOW (ref 13.0–17.0)
Methemoglobin: 1.4 % (ref 0.0–3.0)
Methemoglobin: 1.2 % (ref 0.0–3.0)
Methemoglobin: 0.9 % (ref 0.0–3.0)
Methemoglobin: 1.4 % (ref 0.0–3.0)
O2 Content: 8.3 Vol %
O2 Content: 7.7 Vol %
O2 Content: 8.2 Vol %
O2 Content: 9.1 Vol %
Oxygenated Hemoglobin: 63.8 % — ABNORMAL LOW (ref 85.0–98.0)
Oxygenated Hemoglobin: 58.1 % — ABNORMAL LOW (ref 85.0–98.0)
Oxygenated Hemoglobin: 62.7 % — ABNORMAL LOW (ref 85.0–98.0)
Oxygenated Hemoglobin: 70.4 % — ABNORMAL LOW (ref 85.0–98.0)

## 2022-12-29 LAB — HEPATIC FUNCTION PANEL (LFT)
ALT: 82 U/L — ABNORMAL HIGH (ref 0–55)
AST (SGOT): 28 U/L (ref 5–41)
Albumin/Globulin Ratio: 1 (ref 0.9–2.2)
Albumin: 3.1 g/dL — ABNORMAL LOW (ref 3.5–5.0)
Alkaline Phosphatase: 62 U/L (ref 37–117)
Bilirubin Direct: 0.7 mg/dL — ABNORMAL HIGH (ref 0.0–0.5)
Bilirubin Indirect: 1.1 mg/dL — ABNORMAL HIGH (ref 0.2–1.0)
Bilirubin, Total: 1.8 mg/dL — ABNORMAL HIGH (ref 0.2–1.2)
Globulin: 3 g/dL (ref 2.0–3.6)
Protein, Total: 6.1 g/dL (ref 6.0–8.3)

## 2022-12-29 LAB — WHOLE BLOOD GLUCOSE POCT
Whole Blood Glucose POCT: 130 mg/dL — ABNORMAL HIGH (ref 70–100)
Whole Blood Glucose POCT: 219 mg/dL — ABNORMAL HIGH (ref 70–100)
Whole Blood Glucose POCT: 246 mg/dL — ABNORMAL HIGH (ref 70–100)
Whole Blood Glucose POCT: 191 mg/dL — ABNORMAL HIGH (ref 70–100)
Whole Blood Glucose POCT: 189 mg/dL — ABNORMAL HIGH (ref 70–100)

## 2022-12-29 LAB — CELL MORPHOLOGY
Cell Morphology: NORMAL
Platelet Estimate: DECREASED — AB

## 2022-12-29 LAB — BASIC METABOLIC PANEL
Anion Gap: 13 (ref 5.0–15.0)
Anion Gap: 13 (ref 5.0–15.0)
Anion Gap: 16 — ABNORMAL HIGH (ref 5.0–15.0)
Anion Gap: 16 — ABNORMAL HIGH (ref 5.0–15.0)
BUN: 65 mg/dL — ABNORMAL HIGH (ref 9.0–28.0)
BUN: 66 mg/dL — ABNORMAL HIGH (ref 9.0–28.0)
BUN: 69 mg/dL — ABNORMAL HIGH (ref 9.0–28.0)
BUN: 75 mg/dL — ABNORMAL HIGH (ref 9.0–28.0)
CO2: 23 mEq/L (ref 17–29)
CO2: 24 mEq/L (ref 17–29)
CO2: 22 mEq/L (ref 17–29)
CO2: 22 mEq/L (ref 17–29)
Calcium: 8.6 mg/dL (ref 7.9–10.2)
Calcium: 8.6 mg/dL (ref 7.9–10.2)
Calcium: 8.7 mg/dL (ref 7.9–10.2)
Calcium: 8.8 mg/dL (ref 7.9–10.2)
Chloride: 94 mEq/L — ABNORMAL LOW (ref 99–111)
Chloride: 93 mEq/L — ABNORMAL LOW (ref 99–111)
Chloride: 93 mEq/L — ABNORMAL LOW (ref 99–111)
Chloride: 94 mEq/L — ABNORMAL LOW (ref 99–111)
Creatinine: 4.7 mg/dL — ABNORMAL HIGH (ref 0.5–1.5)
Creatinine: 4.7 mg/dL — ABNORMAL HIGH (ref 0.5–1.5)
Creatinine: 4.8 mg/dL — ABNORMAL HIGH (ref 0.5–1.5)
Creatinine: 4.8 mg/dL — ABNORMAL HIGH (ref 0.5–1.5)
Glucose: 84 mg/dL (ref 70–100)
Glucose: 204 mg/dL — ABNORMAL HIGH (ref 70–100)
Glucose: 242 mg/dL — ABNORMAL HIGH (ref 70–100)
Glucose: 193 mg/dL — ABNORMAL HIGH (ref 70–100)
Potassium: 3.9 mEq/L (ref 3.5–5.3)
Potassium: 4.2 mEq/L (ref 3.5–5.3)
Potassium: 4.3 mEq/L (ref 3.5–5.3)
Potassium: 4.2 mEq/L (ref 3.5–5.3)
Sodium: 130 mEq/L — ABNORMAL LOW (ref 135–145)
Sodium: 130 mEq/L — ABNORMAL LOW (ref 135–145)
Sodium: 131 mEq/L — ABNORMAL LOW (ref 135–145)
Sodium: 132 mEq/L — ABNORMAL LOW (ref 135–145)
eGFR: 12.2 mL/min/{1.73_m2} — AB (ref >=60)
eGFR: 12.2 mL/min/{1.73_m2} — AB (ref >=60)
eGFR: 11.9 mL/min/{1.73_m2} — AB (ref >=60)
eGFR: 11.9 mL/min/{1.73_m2} — AB (ref >=60)

## 2022-12-29 LAB — MAN DIFF ONLY
Atypical Lymphocytes %: 2 %
Atypical Lymphocytes Absolute: 0.15 10*3/uL — ABNORMAL HIGH (ref 0.00–0.00)
Band Neutrophils Absolute: 0 10*3/uL (ref 0.00–1.00)
Band Neutrophils: 0 %
Basophils Absolute Manual: 0 10*3/uL (ref 0.00–0.08)
Basophils Manual: 0 %
Eosinophils Absolute Manual: 0.15 10*3/uL (ref 0.00–0.44)
Eosinophils Manual: 2 %
Lymphocytes Absolute Manual: 3.3 10*3/uL — ABNORMAL HIGH (ref 0.42–3.22)
Lymphocytes Manual: 44 %
Monocytes Absolute: 0.23 10*3/uL (ref 0.21–0.85)
Monocytes Manual: 3 %
Neutrophils Absolute Manual: 3.68 10*3/uL (ref 1.10–6.33)
Segmented Neutrophils: 49 %

## 2022-12-29 LAB — URINE IMMUNOFIXATION ELECTROPHORESIS WITH REVIEW

## 2022-12-29 MED ORDER — BUMETANIDE 0.25 MG/ML IJ SOLN
4.0000 mg | Freq: Once | INTRAMUSCULAR | Status: AC
Start: 2022-12-29 — End: 2022-12-29
  Administered 2022-12-29: 4 mg via INTRAVENOUS
  Filled 2022-12-29: qty 16

## 2022-12-29 MED ORDER — CHLOROTHIAZIDE SODIUM 500 MG IV SOLR
1000.0000 mg | Freq: Every day | INTRAVENOUS | Status: DC
Start: 2022-12-29 — End: 2022-12-29

## 2022-12-29 MED ORDER — METOLAZONE 5 MG PO TABS
10.0000 mg | ORAL_TABLET | Freq: Once | ORAL | Status: AC
Start: 2022-12-29 — End: 2022-12-29
  Administered 2022-12-29: 10 mg via ORAL
  Filled 2022-12-29: qty 2

## 2022-12-29 MED ORDER — SIMETHICONE 80 MG PO CHEW
80.0000 mg | CHEWABLE_TABLET | Freq: Four times a day (QID) | ORAL | Status: DC | PRN
Start: 2022-12-29 — End: 2023-01-04
  Administered 2022-12-29: 80 mg via ORAL
  Filled 2022-12-29: qty 1

## 2022-12-29 MED ORDER — POTASSIUM CHLORIDE 20 MEQ/50ML IV SOLN
20.0000 meq | Freq: Once | INTRAVENOUS | Status: AC
Start: 2022-12-29 — End: 2022-12-29
  Administered 2022-12-29: 20 meq via INTRAVENOUS
  Filled 2022-12-29: qty 50

## 2022-12-29 MED ORDER — CHLOROTHIAZIDE SODIUM 500 MG IV SOLR
1000.0000 mg | Freq: Once | INTRAVENOUS | Status: AC
Start: 2022-12-29 — End: 2022-12-29
  Administered 2022-12-29: 1000 mg via INTRAVENOUS
  Filled 2022-12-29: qty 1000

## 2022-12-29 NOTE — OT Progress Note (Signed)
Occupational Therapy Treatment  [name] [name]        Post Acute Care Therapy Recommendations:     Discharge Recommendations:  Home with supervision    If Home with supervision  recommended discharge disposition is not available, patient will need physical assist for ADLs and functional mobility/transfers and HHOT.     DME needs IF patient is discharging home: Shower chair    Therapy discharge recommendations may change with patient status.  Please refer to most recent note for up-to-date recommendations.    Assessment:   Significant Findings: none    Patient agreeable to OT and tolerated session well.  Pt received in chair and is progressing towards OT goals.  Pt required Mod A to don socks this day, pt reporting soreness in b/l LE making it more difficult to dress LB this day.  CGA/Min A for sit<>stand with VC for safe hand placement and CGA during household mobility with RW and when washing hands at sink.  Pt required 3+ seated rest breaks during OOB activity, cued for taking breaks.  Positioned for comfort in chair and needs met.  Continued OT services to address ADL and mobility goals would benefit pt.      Treatment Activities: ADLs, functional mobility/transfers, pt training     Educated the patient to role of occupational therapy, plan of care, goals of therapy and safety with mobility and ADLs, energy conservation techniques.    Plan:   OT Frequency Recommended: 3-4x/wk     Goal Formulation: Patient  OT Plan  Risks/Benefits/POC Discussed with Pt/Family: With patient  Treatment Interventions: ADL retraining, Functional transfer training, UE strengthening/ROM, Endurance training, Patient/Family training, Equipment eval/education  Discharge Recommendation: Home with supervision  DME Recommended for Discharge: Shower chair  OT Frequency Recommended: 3-4x/wk  OT - Next Visit Recommendation: 12/30/22    Continue plan of care.      Unit: [facility]  Bed: FI103/FI103-01        Precautions and Contraindications:   Precautions  Other Precautions: falls risk    Updated Medical Status/Imaging/Labs:  No results found.     Subjective: "I feel alright."   Patient's medical condition is appropriate for Occupational Therapy intervention at this time.  Patient is agreeable to participation in the therapy session. Nursing clears patient for therapy.    Pain Assessment  Pain Assessment: Numeric Scale (0-10)  Pain Score: 4-moderate pain  Pain Location: Leg  Pain Orientation: Right;Left  Pain Descriptors: Sore  Pain Intervention(s): Medication (See eMAR);Repositioned;Rest (mobility)    Objective:   Observation of Patient/Vital Signs:  Patient is seated in a bedside chair with telemetry, SWAN, ICU lines/leads, external catheter, peripheral IV in place.  Pt wore mask during therapy session:No      Cognition/Neuro Status  Arousal/Alertness: Appropriate responses to stimuli  Attention Span: Appears intact  Orientation Level: Oriented X4  Memory: Appears intact  Following Commands: Follows all commands and directions without difficulty  Safety Awareness: independent  Insights: Fully aware of deficits  Problem Solving: Able to problem solve independently  Behavior: attentive;calm;cooperative    Functional Mobility  Sit to Stand Transfers: Contact Guard Assist;Minimal Assist  Stand to Sit Transfers: Contact Guard Assist;Minimal Assist  Functional Mobility/Ambulation: Nurse, learning disability (with RW)    Self-care and Home Management  Grooming: Contact Guard Assist;standing at sink;wash/dry hands  LB Dressing: Moderate Assist;sitting;Don/doff R sock;Don/doff L sock    Balance  Static Sitting: CGA  Dynamic Sitting: CGA  Static Standing: CGA/Min A  for sit<>stand  Dynamic Standing: CGA with RW     PMP Activity: Step 6 - Walks in Room               Participation: good  Endurance: fair      Patient left with call bell within reach, all needs met, SCDs off as found, fall mat in place, chair alarm in place and all  questions answered. RN notified of session outcome and patient response.       Goals:    Time For Goal Achievement: 5 visits  ADL Goals  Patient will groom self: Supervision, 5 visits  Patient will dress upper body: Supervision, 5 visits  Patient will dress lower body: Supervision, 5 visits  Patient will toilet: Supervision, 5 visits  Mobility and Transfer Goals  Pt will perform functional transfers: Supervision, 5 visits                           PPE worn during session: procedural mask and gloves    Tech present: n/a  PPE worn by tech: N/A      [name], OTR/L  Pager [phone]     Time of Treatment:   OT Received On: 12/29/22  Start Time: 0900  Stop Time: [id number]  Time Calculation (min): 25 min    Treatment # 2 of 5 visits

## 2022-12-29 NOTE — Progress Notes (Signed)
Cardiac Intensive Care Unit (CICU)  Progress Note      Patient Name: [name] [name]  MRN: [id number]  Room: FI103/FI103-01    ICU Day: 1d 16h    I was present with the medical student while the E/M service was performed. I have performed (or re-performed) the physical exam and medical decision making documented by the student. I agree with and have verified that the student documentation is accurate, or have edited the note.      [name] [name]., DO  [facility], Internal Medicine, PGY-2      Interval Events     24 hour events:   -No acute events     Subjective     [name] [name] is doing well this morning. He states he has some bilateral calf pain from laying on the bed all day. He was only able to sleep 5 hours. He is eating and having regular BMs.    Hospital Course:  3/4: RHC placed.   3/5: Diuresis, good UOP.   3/6: Started vanc and zosyn. Blood culture positive, repeat sent. RML possible infiltrate on CXR. CI: 1.7 overnight, lactate cleared. Wife to bring in CLL medication.   3/7: Improving CI overnight. Occasional 8 beat runs once or twice per shift. Antibiotics discontinued.   3/8: Lasix 120 at 9pm last night. Reports of Swan issues, some higher PAP due to line issues.  Cardiac index stabile, lactic acid clear  3/10: Lactate up to 2.7 with decreasing UOP and Cr up to 4.6. Readmitted to the CICU.   3/11: Continued to diurese. Dobutamine decreased to 2.5.     Home medications include Eliquis 2.5 mg BID, ASA 81 mg QD, Lipitor 40 mg QHS, Jardiance 10 mg QD, Hydralazine 25 mg Q8H, Isosorbide Dinitrate 10 mg TID, metoprolol succinate XL 12. 5 mg QD, torsemide 40 mg BID, Lantus 50U QD. Was previously on Aldactone but discontinued given renal function. Dry weight is around 185 lb per 10/2022     Assessment & Plan   Assessment and Plan   Summary: [name] [name] a 77 y.o. 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  -  Inotropy: dobutamine decreased to 2.5 mg/hr -Afterload: hydralazine '100mg'$  TID, isordil '20mg'$  TID.  -MCS: [employer]tronik ICD  -Advanced Therapies: undergoing eval for LVAD, delayed by worsening renal function      # Heart Failure   Diurese as above  GDMT:  Beta Blockers: hold in the setting of shock  ACEi/ARB/ARNI: hold in the setting of worsening kidney function  MRA: hold in the setting of worsening kidney function  SGLT2i: hold in the setting of worsening kidney function    # CAD s/p DES 2011  Continue aspirin '81mg'$  daily   Continue atorvastatin '40mg'$     # NSVT s/p unsuccessful cardioversion 3/4   Converted to NSR s/p adenosine.   Amiodarone '400mg'$  TID PO (last dose 3/12 then transition to '200mg'$  QD dosing)    # RLE DVT on home Eliquis  Therapeutic heparin for DVT held in the setting of thrombocytopenia  and bleeding from insertion  sites    MAP goal 65-75     PULMONARY:   No acute concerns    Last vent settings if on vent:     Ventilator Time (if on vent):       Supplemental oxygen as needed to keep sat > 92%   RENAL:   Acute on chronic renal failure      Intake/Output Summary (Last 24 hours) at 12/29/2022 1412  Last data filed at 12/29/2022 1200  Gross per 24 hour   Intake 1460.5 ml   Output 1825 ml   Net -364.5 ml      # Acute on Chronic Kidney Disease   CKD 3B; 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     * No values recorded between 12/25/2022  2:07 PM and 12/29/2022  2:12 PM *  Lab Results   Component Value Date    WBC 7.51 12/29/2022      CAUTI Prevention (n/a if blank):  Foley Day:         Blood Steam Infection Prevention (n/a if blank):   Implanted Catheter 12/25/22 Hohn Tunneled Right Internal jugular  Peripheral Arterial Line 12/27/22 Right Radial  Pulmonary Artery Catheter 12/28/22 Internal jugular  Double Lumen Introducer 12/28/22 Internal jugular Right   Last culture dates: blood cultures 12/23/22 TGTD x5 days           HAI Prevention Plan  Initiate/Maintain Central Line (CVC/PICC) for titration of cardiac medications       HEME/ONC:   Chronic Lymphocytic Leukemia  Thrombocytopenia     # Thrombocytopenia   -Unclear etiology.  -Nutritional studies and hemolysis labs normal, HIT Ab negative, SRA in process.   -Per Oncology team: "Suspect likely due to acute illness with cardiogenic shock and AKI necessitating inotropic support, less likely immune thrombocytopenia in the setting of CLL given overall good control of disease."  Heme-Onc following  CTM  Plt count >50k in the  absence of bleeding   Therapeutic heparin for DVT held in the setting of thrombocytopenia     # Chronic Lymphocytic Leukemia  Initiated on  acalabrutinib 04/2022 for worsening leukocytosis and adenopathy.  Acalabrutinib held in the setting of thrombocytopenia  Heme-Onc following         SCDs  DVT chemoprophylaxis: heparin (porcine) injection 5,000 Units      ENDO/RHEUM:  Type II Diabetes   Goal glucose range 100 - 180 WNL 3/12       Current Infusions (n/a if blank):    bumetanide 2 mg/hr (12/29/22 1308)    DOBUTamine 2.5 mcg/kg/min (12/29/22 1200)          Advance Care Planning: Open ACP Navigator  Code Status: Full Code  Next of Kin:  Primary Emergency Contact: Sizer,Octavia           ADVANCE CARE PLAN                     Objective      Examination:   Heart Rate:  [77-91] 82  Resp Rate:  [20-67] 30  BP: (107-128)/(55-65) 128/61  Arterial Line BP: (111-138)/(48-62) 119/54  SpO2: 94-98% on RA  Maps: 75-85    Neuro:    alert & oriented x 3 , appropriate mood and affect    Lungs:   clear to auscultation, no wheezes, rales or rhonchi, symmetric air entry     Cardiac:   normal rate, regular rhythm, normal S1, S2, no murmurs, rubs, clicks or gallops, JVD 15cmH2O    Abdomen:    soft, nontender, nondistended, no masses or organomegaly     Extremities:   peripheral pulses normal no pedal edema, pain with palpation of calf bilaterally. No edema or redness noted.     Skin:     Warm, dry and intact. Incisions clean and dry with no pus or drainage.    Fick/Cardiac Hemodynamics   VO2 Determination  Height: 182.9 cm (6' 0.01")  Weight FICK (only): 96 kg (211 lb 10.3 oz)  BSA (Fick only) (Calculated - sq m): 2.21 sq meters  VO2: 276.25    CI (L/min/m2): 3.2 L/min/m2    CO/CI Determination  SaO2 : 0.95  SvO2 : 0.58  SaO2 - SvO2: 0.37  HGB Result: 9  x 1.36 x 10: 45.29  Cardiac Output (FICK): 6.1  Cardiac Index (FICK): 2.76  Cardiac Power Output: 0.99    PVR Determination  PAP: 66/27  PAP (Mean): 39 mmHg  PAWP (mmHg): 18  mmHg  Pulmonary Artery Pulsatility Index (PAPI): 1.77    SVR Determination  MAP (mmHg): 73  CVP (mmHg): 22 mmHg  MAP - CVP: 56  SVR (dynes/second/cm3): 734.43 dynes/second/cm3       Most Recent Labs  7.51 \ 9.0* / 78*    / 27.3* \    03/12 0345    130* 93* 66.0* / 204*   4.2 24 4.7* \    03/12 1022            Additional Diagnoses:     Patient has BMI=Body mass index is 27.91 kg/m.  Diagnosis: Overweight based on BMI criteria            Recent Labs     12/29/22  0345 12/28/22  0351 12/27/22  0410   Platelets 78* 69* 67*     Diagnosis: Moderate Thrombocytopenia               Recent Labs     12/29/22  1022 12/29/22  0345 12/28/22  2214 12/28/22  1638 12/28/22  1007 12/28/22  0351 12/27/22  1706 12/27/22  0410 12/26/22  1705  Sodium 130* 130* 130* 129* 130* 131* 132* 131* 134*     Diagnosis: No additional diagnosis                Recent Labs   Lab 12/29/22  0345 12/28/22  0351 12/27/22  0410   Hgb 9.0* 8.9* 9.6*   Hematocrit 27.3* 26.8* 29.4*   MCV 100.4* 98.2* 101.4*   WBC 7.51 6.89 6.39   Platelets 78* 69* 67*     Recent Labs   Lab 12/24/22  1513 12/24/22  0912 12/22/22  1430 12/21/22  2247   Ferritin  --   --  57.00  --    Iron  --   --   --  32*   TIBC  --   --   --  307   Iron Saturation  --   --   --  10*   Folate 12.6  --   --   --    Haptoglobin  --  165  --   --      Anemia Diagnosis: No new diagnosis

## 2022-12-29 NOTE — Progress Notes (Signed)
[facility].  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  -CAD with past PCI   -Mild hyponatremia due to hypervolemia   -Non gap acidosis due to CKD                Plan:     -serum creatinine rising to 4.7 mg/dL   -Currently on bumex drip at 2 mg/hr    UOP 2.3L  -No need for RRT at this time but may need it soon      -supportive care   -please try to avoid contrast studies and nephrotoxins   -daily labs         [name], MD  Epic Secure Chat: direct or group "[facility]  [place]  Office - [phone]  ++++++++++++++++++++++++++++++++++++++++++++++++++++++++++++++  Chief Complaint:    Interval History:    Medications:  Scheduled Meds:  Current Facility-Administered Medications   Medication Dose Route Frequency    [START ON 12/30/2022] amiodarone  200 mg Oral Daily    aspirin EC  81 mg Oral Daily    atorvastatin  40 mg Oral QHS    heparin (porcine)  5,000 Units Subcutaneous Q8H [facility]    hydrALAZINE  100 mg Oral Q8H [facility]    insulin glargine  33 Units Subcutaneous Q12H    Or    insulin glargine  15 Units Subcutaneous Q12H    insulin lispro  1-6 Units Subcutaneous QHS    insulin lispro  10 Units Subcutaneous TID AC    insulin lispro  2-10 Units Subcutaneous TID AC    isosorbide dinitrate  20 mg Oral TID - Nitrate Free Interval    niacin  500 mg Oral BID    polyethylene glycol  17 g Oral Daily    senna-docusate  1 tablet Oral Q12H [place]     Continuous Infusions:   bumetanide 2 mg/hr (12/29/22 0800)    DOBUTamine 2.5 mcg/kg/min (12/29/22 0957)     PRN  Meds:acetaminophen, dextrose **OR** dextrose **OR** dextrose **OR** glucagon (rDNA), melatonin, phenol    Objective:  Vital signs in last 24 hours:  Heart Rate:  [77-91] 80  Resp Rate:  [20-28] 21  BP: (108-126)/(54-65) 108/55  Arterial Line BP: (111-138)/(48-62) 120/55  Intake/Output last 24 hours:    Intake/Output Summary (Last 24 hours) at 12/29/2022 1002  Last data filed at 12/29/2022 0800  Gross per 24 hour   Intake 1488.58 ml   Output 1951 ml   Net -462.42 ml     Intake/Output this shift:  I/O this shift:  In: 135 [P.O.:120; I.V.:15]  Out: -  Physical Exam:   Gen: Well developed, no acute distress   CV: S1 S2 N RRR, no edema   Chest: Good effort, CTAB    Labs:  Recent Labs   Lab 12/29/22  0345 12/28/22  2214 12/28/22  1638 12/28/22  1007 12/28/22  0351 12/27/22  1706 12/27/22  0410 12/25/22  1714 12/25/22  0328   Glucose 84 95 246*  More results in Results Review 91  More results in Results Review 263*  More results in Results Review 283*   BUN 65.0* 66.0* 62.0*  More results in Results Review 56.0*  More results in Results Review 51.0*  More results in Results Review 42.0*   Creatinine 4.7* 4.8* 4.9*  More results in Results Review 4.6*  More results in Results Review 3.5*  More results in Results Review 2.7*   Calcium 8.6 8.7 8.6  More results in Results Review 8.9  More results in Results Review 9.0  More results in Results Review 9.0   Sodium 130* 130* 129*  More results in Results Review 131*  More results in Results Review 131*  More results in Results Review 134*   Potassium 3.9 4.0 3.8  More results in Results Review 3.7  More results in Results Review 4.2  More results in Results Review 3.6   Chloride 94* 94* 93*  More results in Results Review 94*  More results in Results Review 94*  More results in Results Review 98*   CO2 '23 23 25  '$ More results in Results Review 23  More results in Results Review 25  More results in Results Review 24   Albumin 3.1*  --   --   --  3.0*  --  3.1*  More results in  Results Review 3.0*   Phosphorus  --   --   --   --   --   --   --   --  2.9   Magnesium 2.2 2.3 2.3  More results in Results Review 2.1  More results in Results Review 2.1  More results in Results Review  --    More results in Results Review = values in this interval not displayed.     Recent Labs   Lab 12/29/22  0345 12/28/22  0351 12/27/22  0410   WBC 7.51 6.89 6.39   Hgb 9.0* 8.9* 9.6*   Hematocrit 27.3* 26.8* 29.4*   MCV 100.4* 98.2* 101.4*   MCH 33.1 32.6 33.1   MCHC 33.0 33.2 32.7   RDW 17* 17* 16*   MPV 11.6 11.4 12.3   Platelets 78* 69* 67*

## 2022-12-29 NOTE — Progress Notes (Signed)
LVAD Social Work Note:     SW visited pt at bedside. Spouse was present at the time. Spouse notified the team that the identified secondary caregiver was out of town and could not participate. VAD education and caregiver support plan was to be confirmed with pt and his caregivers today, which still remains pending at this time.     Pt and spouse met with one of the LVAD peer advocate, Mr. Nyoka Cowden and his caregiver,  today at bedside. Pt reports he had a good visit with them. He stated that he was glad to see Mr. Nyoka Cowden living a quality life independently.     SW followed up with Spouse and pt regarding the urgency of having the secondary caregiver in place for his LVAD candidacy from psychosocial perspective. SW discussed the caregiver responsibilities and also shared some LVAD pt's real scenarios needing requiring both caregivers to be able to provide 24/7 supervision,safety, transportation, nutrition, education and emotional support to the pt.     Spouse to contact the identified caregiver to reschedule the VAD education and SW assessment with caregivers.     Charlotte Sanes, LCSW  Advance Heart Failure/LVAD/Cardiac Transplant Social Worker  [place]  [address]  [place], [place] [address]  Brexley [email or web address]: [phone]

## 2022-12-29 NOTE — PT Progress Note (Signed)
Physical Therapy Treatment  Fernanda Drum  Post Acute Care Therapy Recommendations:     Discharge Recommendations:  Home with supervision    DME needs IF patient is discharging home: Front wheel walker    Therapy discharge recommendations may change with patient status.  Please refer to most recent note for up-to-date recommendations.      Assessment:   Significant Findings: none    Pt presents with decreased strength, decreased activity tolerance, impaired balance and gait deficits. Pt is cga for sit to stand transfer. Pt is able to ambulate multiple short distances with rolling walker, cga, verbal cues to maintain rolling walker close to body. Pt would benefit from continued acute skilled physical therapy services to address these deficits and maximize independence with functional mobility. Recommend D/C to home with supervision.      Assessment: Decreased UE strength, Decreased LE strength, Decreased safety/judgement during functional mobility, Decreased endurance/activity tolerance, Impaired coordination, Decreased functional mobility, Decreased balance, Gait impairment  Progress: Progressing toward goals  Prognosis: Good, With continued PT status post acute discharge  Risks/Benefits/POC Discussed with Pt/Family: With patient  Patient left without needs and call bell within reach. RN notified of session outcome.     Treatment Activities: bed mobility, transfer training, gait training, therapeutic exercise, pt education and discharge planning     Educated the patient to role of physical therapy, plan of care, goals of therapy and HEP, safety with mobility and ADLs, energy conservation techniques, discharge instructions.    Plan:   Treatment/Interventions: Exercise, Gait training, Neuromuscular re-education, Functional transfer training, LE strengthening/ROM, Endurance training, Patient/family training, Equipment eval/education, Bed mobility        PT Frequency: 2-3x/wk     Continue plan of care.    Unit:  [facility]  Bed: FI103/FI103-01     Precautions and Contraindications:   Precautions  Other Precautions: falls risk    Updated Medical Status/Imaging/Labs: Reviewed Chart     Subjective: Pt states, "I walked a lap yesterday."   Patient Goal: None stated    Pain Assessment  Pain Assessment: No/denies pain     Patient's medical condition is appropriate for Physical Therapy intervention at this time.  Patient is agreeable to participation in the therapy session. Nursing clears patient for therapy.    Objective:   Observation of Patient/Vital Signs:  Patient is seated in a bedside chair with telemetry, radial arterial line, peripheral IV, and male external catheter in place.    Cognition/Neuro Status  Arousal/Alertness: Appropriate responses to stimuli  Attention Span: Appears intact  Orientation Level: Oriented X4  Memory: Appears intact  Following Commands: Follows all commands and directions without difficulty  Safety Awareness: independent  Insights: Fully aware of deficits  Problem Solving: Able to problem solve independently  Behavior: attentive;calm;cooperative  Motor Planning: intact  Coordination: intact         Functional Mobility:  Sit to Supine: Stand by Assist  Sit to Stand: Contact Guard Assist  Stand to Sit: Contact Guard Assist       Ambulation:  PMP - Progressive Mobility Protocol   PMP Activity: Step 6 - Walks in Room  Distance Walked (ft) (Step 6,7): 300 Feet (75 ft, 4 times, seated rest breaks between attempts)     Ambulation: Contact Guard Assist;with front-wheeled walker  Pattern: decreased cadence;decreased step length      Patient Participation: Good  Patient Endurance: Fair     Patient left with call bell within reach, all needs met,  SCDs not in place, fall mat in place, bed alarm on, chair alarm n/a and all questions answered. RN notified of session outcome and patient response.     Goals:  Goals  Goal Formulation: With patient  Time for Goal Acheivement: 5  visits  Goals: Select goal  Pt Will Go Supine To Sit: with supervision, to maximize functional mobility and independence  Pt Will Perform Sit To Supine: with supervision, to maximize functional mobility and independence  Pt Will Perform Sit to Stand: with supervision, to maximize functional mobility and independence  Pt Will Ambulate: > 200 feet, with rolling walker, with supervision, to maximize functional mobility and independence (w/ LRAD)  Pt Will Go Up / Down Stairs: 1-2 stairs, with supervision, With rail, to maximize functional mobility and independence    PPE worn during session: gloves  Pt wore mask during therapy session:No    Tech present: No  PPE worn by tech: N/A    Time of Treatment  PT Received On: 12/29/22  Start Time: [id number]  Stop Time: 0948  Time Calculation (min): 23 min    Treatment # 1 out of 5 visits    Hal Hope, DPT, pager [phone]

## 2022-12-29 NOTE — Consults (Signed)
Advanced Heart Failure and Transplant Consult Note    [facility] [employer] (NEW NAME): '[facility] Advanced Heart Failure Inpatient Team'    Primary Advanced HF Cardiologist: Dr. 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 At this moment from a CLL standpoint, he is quite stable.  Right lower extremity DVT (5/23)   Type 2 diabetes with an A1c 5.9%  Stable COPD  Multiple hospital admissions for acute decompensated heart failure in 2023  DM2  Thrombocytopenia  Iron deficit anemia   Iron studies (3/6): Iron sat 10, Ferritin 57  S/p Iron repletion (iron sucrose 200 mg x5 from 3/6 to 3/10)  Depression   Consult to psych   Aspiration event  Aspirated on 12/22/22, passed swallow eval thereafter     Recommendations:       Cardiogenic shock  Preload: Continue with Bumex gtt at '2mg'$ /hr and metolazone   Inotropes: inotropes titration as per primary team to maintain CI> 2.2  Afterload: continue hydralazine 100 mg TID and isordil 20 TID  MCS: currently not needed  Advanced therapies candidacy is undergoing for potential LVAD. Potential barriers are represented by the BiV dysfunction, persistently elevated filling pressures despite adequate CI, worsening AKI on stage 4 CKD, limited functional status, thrombocytopenia with oozing from lines despite  holding heparin gtt.   Ischemic Cardiomyopathy, EF 11%  Diuresis as above  GDMT:  Beta Blockers: hold in the setting of shock  ACEi/ARB/ARNI: hold in the setting of worsening kidney function  MRA: hold in the setting of worsening kidney function  SGLT2i: hold in the setting of worsening kidney function    Signed by: Pilar Grammes, MD     I have examined this patient and have reviewed the notes, assessments and/or procedures performed by Dr. Maurine Minister. I concur with his documentation of this patient--with these modifications made directly to the note above.    This patient has a high probability of sudden clinically significant deterioration which requires the highest level of physician preparedness to intervene urgently. To summarize, the patient is critically ill due to cardiovascular system failure and we are providing the following life or organ supporting interventions and critical care services: cardiogenic shock that I managed/supervised and that required frequent physician assessments. I devoted my full attention in the ICU to the direct care of this patient for this period of time. I spent 32 minutes providing the critical care services noted and this time is exclusive of teaching, billable procedures, and not overlapping with any other providers.    Jakson Delpilar S. Opal Sidles, MD, MSc, [facility], [place]    Patient has BMI=Body mass index is 27.91 kg/m.  Diagnosis: No additional diagnosis based on BMI criteria     Patient Active Problem List   Diagnosis    Type 2 diabetes mellitus with  complication, with long-term current use of insulin    Heart attack    CAD (coronary artery disease)    Non-ischemic cardiomyopathy    Acute exacerbation of CHF (congestive heart failure)    PUD (peptic ulcer disease)    Acute dyspnea    CHF (congestive heart failure)    Headaches due to old head injury    Essential hypertension    Syncope and collapse    AICD (automatic cardioverter/defibrillator) present    Cardiomyopathy    SOB (shortness of  breath)    GERD (gastroesophageal reflux disease)    Hypertensive heart disease without heart failure    Heart failure, unspecified    Nonischemic cardiomyopathy    Pure hypercholesterolemia    Chronic systolic congestive heart failure    Encounter for implantable defibrillator reprogramming or check    Episode of recurrent major depressive disorder, unspecified depression episode severity    Stage 3 chronic kidney disease, 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    Anxiety and depression    COPD (chronic obstructive pulmonary disease)    HLD (hyperlipidemia)    Sleep apnea, obstructive    Scrotal edema    Hydrocele, unspecified hydrocele type    Acute on chronic congestive heart failure, unspecified heart failure type    Acute on chronic right-sided heart failure    Ischemic cardiomyopathy    CLL (chronic lymphocytic leukemia)    SVT (supraventricular tachycardia)    Heart failure    Iron deficiency anemia secondary to inadequate dietary iron intake    Heart failure, unspecified HF chronicity, unspecified heart failure type       Past Medical History:  Past Medical History:   Diagnosis Date    Acute CHF     NOV  & DEC 2018, - DIALYSIS CATH PLACED Aug 24 2017 TO REMOVE FLUID     Acute systolic (congestive) heart failure 08/2017    Anxiety     CAD (coronary artery disease)     Cardiomyopathy     nonischemic    CHF (congestive heart failure) 08/12/2014, 2013    Chronic obstructive pulmonary disease     POSSIBLE PER PT HE USES  SYNBICORT BID ABD  BREO INHALER PRN    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    Coronary artery disease 2011    CORONARY STENT PLACEMENT FOLLOWED BY [facility]    Depression     echocardiogram 02/2016, 11/2016, 09/2017, 12/20/2017    GERD (gastroesophageal reflux disease)     Heart attack 2011    and 2014    Hyperlipemia     Hypertension     ICD (implantable 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     PLACED 2014  [facility]  LAST INTERROGATION April 01 2018 REPORT REQUESTED    Ischemic  cardiomyopathy     EF 15% ON ECHO 09-20-2017 DR GARG IN EPIC    Nonischemic cardiomyopathy     NSTEMI (non-ST elevated myocardial infarction)     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    Pacemaker 2014    MEDTRONIC ICD/PACEMAKER COMBO LAST INTERROGATION  12-12 2018    Pneumonia 09/2016    Primary cardiomyopathy     Ischemic cardiomyopathy EF 15% ON ECHO 09-20-2017 DR GARG IN EPIC    Sepsis 08/2017    Syncope and collapse     PRIOR TO ICD/PACEMAKER PLACED 2014    Type 2 diabetes mellitus, controlled DX 1998    BS  AVG 100  A1C 7.4 Dec 27 2017  [place]     PT SEE HIS PMD April 01 2018 RE THIS-TOLD TO SEE PMD BY [facility] JUNE 12 WHEN THEY SAW HIM FOR A CL        Past Surgical History:  Past Surgical History:   Procedure Laterality Date    BIV  11/10/2016    ICD [place]  UPGRADE     CARDIAC CATHETERIZATION  03/2010    CORONARY STENT PLACEMENT X 2 PER PT; LM normal, LAD 70-80% distal lesion at apex, 20% lesion mid CFX    CARDIAC CATHETERIZATION  12/2012    CARDIAC DEFIBRILLATOR PLACEMENT  12/2012    [facility]    CARDIAC PACEMAKER PLACEMENT  2014    MEDTRONIC ICD/PACEMAKER PLACED [facility]    CIRCUMCISION  AGE 61    COLONOSCOPY  2009    CORRECTION HAMMER TOE      duodenal ulcer  1973    STRESS RELATED IN SCHOOL    ECHOCARDIOGRAM, TRANSTHORACIC  02/2016 11/2016 09/2017 12/2017    ECHOCARDIOGRAM, TRANSTHORACIC  02/2016,11/2016,12/20/2017    EGD N/A 08/15/2014    Procedure: EGD;  Surgeon: Loraine Maple, MD;  Location: [facility];  Service: Gastroenterology;  Laterality: N/A;  egd w/ bx    EGD  1975    EGD, COLONOSCOPY N/A 04/04/2018    Procedure: EGD with bxs, COLONOSCOPY with polypectomy and clipping;  Surgeon: Omer Jack, MD;  Location: [facility];  Service: Gastroenterology;  Laterality: N/A;    FRACTURE SURGERY  AGE 61     RT  ANKLE- CAST APPLIED    HERNIA REPAIR  AGE 61    LEFT INGUINAL HERNIA    MPI nuclear study  06/2016    orthopedic surgery  AGE 42    RT foot corrective - HAMMERTOE    RIGHT HEART CATH Right  09/09/2022    Procedure: Right Heart Cath;  Surgeon: Wandra Arthurs, MD;  Location: [facility];  Service: Cardiovascular;  Laterality: Right;  tx from [place] INCL. CO AND SATS Right 12/22/2022    Procedure: Right Heart Cath ONLY incl. CO and Sats;  Surgeon: Day, Lillette Boxer, MD;  Location: [facility];  Service: Cardiovascular;  Laterality: Right;  Right Heart Cath with leave in PA cath for milrinone initiation    [place]          Current Meds:  [START ON 12/30/2022] amiodarone, 200 mg, Oral, Daily  aspirin EC, 81 mg, Oral, Daily  atorvastatin, 40 mg, Oral, QHS  heparin (porcine), 5,000 Units, Subcutaneous, Q8H SCH  hydrALAZINE, 100 mg, Oral, Q8H SCH  insulin glargine, 33 Units, Subcutaneous, Q12H   Or  insulin glargine, 15 Units, Subcutaneous, Q12H  insulin lispro, 1-6 Units, Subcutaneous, QHS  insulin lispro, 10 Units, Subcutaneous, TID AC  insulin lispro, 2-10 Units, Subcutaneous, TID AC  isosorbide dinitrate, 20 mg, Oral, TID - Nitrate Free Interval  niacin, 500 mg, Oral, BID  polyethylene glycol, 17 g, Oral, Daily  senna-docusate, 1 tablet, Oral, Q12H SCH        Drips:   bumetanide 2 mg/hr (12/29/22 1200)    DOBUTamine 2.5 mcg/kg/min (12/29/22 1200)       Home Meds:  Home Medications               apixaban (ELIQUIS) 5 MG     Take 1 tablet (5 mg) by mouth every 12 (twelve) hours For 1 week followed by 1 tablet  twice daily     aspirin EC 81 MG EC tablet     Take 1 tablet (81 mg) by mouth daily     atorvastatin (LIPITOR) 40 MG tablet     Take 1 tablet (40 mg total) by mouth nightly     Notes:  PLEASE ADVISE PATIENT TO SCHEDULE OVERDUE FOLLOW-UP WITH GENERAL CARDIOLOGY (DR. Camille Bal) FOR CONTINUED MANAGEMENT/REFILLS.     Calquence 100 MG Tab          hydrALAZINE (APRESOLINE) 25 MG tablet (Expired)     Take 1 tablet (25 mg) by mouth every 8 (eight) hours     Insulin Glargine-yfgn (Semglee, yfgn,) 100 UNIT/ML Solution Pen-injector     50 Units by Subdermal  route daily     isosorbide dinitrate (ISORDIL) 10 MG tablet (Expired)     Take 1 tablet (10 mg) by mouth 3 (three) times daily     Jardiance 10 MG tablet     TAKE 1 TABLET(10 MG) BY MOUTH DAILY     loratadine (CLARITIN) 10 MG tablet     Take 1 tablet (10 mg) by mouth daily     metoprolol succinate XL (TOPROL-XL) 25 MG 24 hr tablet     Take 0.5 tablets (12.5 mg) by mouth daily     niacin (NIASPAN) 500 MG CR tablet     TAKE 1 TABLET BY MOUTH TWICE DAILY     Patient taking differently: 250 mg Taking half a tablet     torsemide (DEMADEX) 20 MG tablet     Take 2 tablets (40 mg) by mouth 2 (two) times daily               Allergies:  Allergies   Allergen Reactions    Allopurinol        Family History:  Family History   Problem Relation Age of Onset    Breast cancer Mother     Heart attack Mother 18    Hypertension Mother     Diabetes Mother     Coronary artery disease Mother     Diabetes Brother     Heart attack Father     Hypertension Father        Social History:  Social History     Socioeconomic History    Marital status: Married     Spouse name: Architect    Number of children: 0   Occupational History    Occupation: Therapist, [employer] [place], [place]    Tobacco Use    Smoking status: Never    Smokeless tobacco: Never   Vaping Use    Vaping Use: Never used   Substance and Sexual Activity    Alcohol use: Not Currently    Drug use: No    Sexual activity: Yes     Partners: Female     Social Determinants of Health     Financial Resource Strain: Low Risk  (12/22/2022)    Overall Financial Resource Strain (CARDIA)     Difficulty of Paying Living Expenses: Not hard at all   Food Insecurity: No Food Insecurity (09/12/2022)    Hunger Vital Sign     Worried About Running Out of Food in the Last Year: Never true     Ran Out of Food in the Last Year: Never true   Transportation Needs: No Transportation Needs (12/22/2022)    PRAPARE - Armed forces logistics/support/administrative officer (Medical): No     Lack of Transportation (Non-Medical): No  Intimate  Partner Violence: Not At Risk (09/12/2022)    Humiliation, Afraid, Rape, and Kick questionnaire     Fear of Current or Ex-Partner: No     Emotionally Abused: No     Physically Abused: No     Sexually Abused: No   Housing Stability: Low Risk  (12/22/2022)    Housing Stability Vital Sign     Unable to Pay for Housing in the Last Year: No     Number of Places Lived in the Last Year: 1     Unstable Housing in the Last Year: No          Objective:   Vital Signs in last 24 hours:  Heart Rate:  [77-91] 87  Resp Rate:  [20-67] 33  BP: (107-126)/(54-65) 107/60  Arterial Line BP: (111-138)/(48-62) 113/52  SpO2: 96 %  O2 Device: None (Room air)    Vent Settings  FiO2: (S) 40 %    Telemetry: NSVTx3 in the last 24 hours    Hemodynamics:  PAP: 62/23  PAP (Mean): 37 mmHg  CVP (mmHg): 19 mmHg     CO (L/min): 7 L/min  CI (L/min/m2): 3.2 L/min/m2  Cardiac Output (FICK): 6.1  Cardiac Index (FICK): 2.76  SVR (dyne*sec)/cm5: 619 (dyne*sec)/cm5  SV (mL): 84.3 mL      Intake / Output:    Intake/Output Summary (Last 24 hours) at 12/29/2022 1258  Last data filed at 12/29/2022 1200  Gross per 24 hour   Intake 1538.58 ml   Output 2225 ml   Net -686.42 ml          Weight:      12/28/2022    10:00 PM 12/29/2022     4:00 AM 12/29/2022    10:20 AM   Weight Monitoring   Height 182.9 cm 182.9 cm 182.9 cm   Weight   93.35 kg   Weight Method   Standing Scale   BMI (calculated)   27.9 kg/m2        Weight change:        Physical Exam:   General: well-developed, alert, NAD  HEENT: conjunctiva pale, buccal mucosa moist, anicteric sclera, no erythema, JVP ~ 12 cm H2O, no carotid bruits, carotid pulses are present bilaterally  Lungs: CTA B/L, no rhonchi, wheezes or crackles with normal rate and effort  Cardiovascular: S1, S2, normal rate, regular rhythm, holosystolic murmur  Abdominal: soft, NT/ND, +B.S., no HSM  Extremities: warm to touch, no cyanosis, no edema, peripheral pulses present bilaterally  Neuromuscular exam: alert and oriented x 4, speech fluent      LDAs:  Patient Lines/Drains/Airways Status       Active Lines, Drains and Airways       Name Placement date Placement time Site Days    Implanted Catheter 12/25/22 Hohn Tunneled Right Internal jugular 12/25/22  1500  Internal jugular  3    Double Lumen Introducer 12/28/22 Internal jugular Right 12/28/22  0000  Internal jugular  1    Pulmonary Artery Catheter 12/28/22 Internal jugular 12/28/22  0000  -- 1    Peripheral IV 12/21/22 18 G Left Antecubital 12/21/22  2200  Antecubital  7    Peripheral IV 12/26/22 22 G Anterior;Left Forearm 12/26/22  1143  Forearm  3    External Urinary Catheter 12/24/22  2000  --  4    Peripheral Arterial Line 12/27/22 Right Radial 12/27/22  2300  Radial  1  Labs:  CBC        12/29/22  0345 12/28/22  0351 12/27/22  0410   WBC 7.51 6.89 6.39   Hgb 9.0* 8.9* 9.6*   Hematocrit 27.3* 26.8* 29.4*   Platelets 78* 69* 67*       BMP        12/29/22  1022 12/29/22  0345 12/28/22  2214 12/26/22  1705 12/26/22  1141 12/25/22  1714 12/25/22  0328 12/24/22  1513 12/24/22  0912   BUN 66.0* 65.0* 66.0*   < >  --    < > 42.0*   < >  --    Creatinine 4.7* 4.7* 4.8*   < >  --    < > 2.7*   < >  --    eGFR 12.2* 12.2* 11.9*   < >  --    < > 23.7*   < >  --    Sodium 130* 130* 130*   < >  --    < > 134*   < >  --    Potassium 4.2 3.9 4.0   < >  --    < > 3.6   < >  --    Chloride 93* 94* 94*   < >  --    < > 98*   < >  --    CO2 '24 23 23   '$ < >  --    < > 24   < >  --    Calcium 8.6 8.6 8.7   < >  --    < > 9.0   < >  --    Magnesium 2.3 2.2 2.3   < >  --    < >  --   --   --    Phosphorus  --   --   --   --   --   --  2.9  --   --    Lactic Acid 1.3 0.8 0.9   < > 2.3*   < > 1.3   < > 1.6   NT-proBNP  --   --   --   --  9,403*  --   --   --   --    LDH  --   --   --   --   --   --   --   --  507*    < > = values in this interval not displayed.       LFTs        12/29/22  0345 12/28/22  0351 12/27/22  0410   AST (SGOT) 28 35 43*   ALT 82* 108* 145*   Alkaline Phosphatase 62 64 75    Bilirubin, Total 1.8* 1.9* 1.7*   Bilirubin Direct 0.7* 0.8* 0.8*   Bilirubin Indirect 1.1* 1.1* 0.9       Coags        12/27/22  0410 12/26/22  2024 12/26/22  0409 12/25/22  0328 12/24/22  1112 12/24/22  0301 12/23/22  1713 12/23/22  1200   PT  --   --   --   --  16.3*  --  17.1*  --    PT INR  --   --   --   --  1.4*  --  1.5*  --    PTT  --   --   --   --  102*  --  33 63*  Anti-Xa, UFH 0.09 0.46 <0.04   < > 0.59   < >  --   --     < > = values in this interval not displayed.       Microbiology  Microbiology Results (last 15 days)       Procedure Component Value Units Date/Time    MRSA culture [id number] Collected: 12/23/22 1200    Order Status: Completed Specimen: Culturette from Nasal Swab Updated: 12/24/22 1311     Culture MRSA Surveillance Negative for Methicillin Resistant Staph aureus    MRSA culture [id number] Collected: 12/23/22 1200    Order Status: Completed Specimen: Culturette from Throat Updated: 12/24/22 1311     Culture MRSA Surveillance Negative for Methicillin Resistant Staph aureus    Culture Blood Aerobic and Anaerobic [id number] Collected: 12/22/22 2239    Order Status: Completed Specimen: Blood, Venipuncture Updated: 12/28/22 0425    Narrative:      The order will result in two separate 8-1m bottles  Please do NOT order repeat blood cultures if one has been  drawn within the last 48 hours  UNLESS concerned for  endocarditis  AVOID BLOOD CULTURE DRAWS FROM CENTRAL LINE IF POSSIBLE  Indications:->Pneumonia  ORDER#: [id number]                                   ORDERED BY: GVear Clock SOURCE: Blood, Venipuncture                          COLLECTED:  12/22/22 22:39  ANTIBIOTICS AT COLL.:                                RECEIVED :  12/23/22 02:00  Culture Blood Aerobic and Anaerobic        FINAL       12/28/22 04:25  12/28/22   No growth after 5 days of incubation.      Culture Blood Aerobic and Anaerobic [[id number]: 12/22/22 2239    Order Status: Completed Specimen: Blood,  Venipuncture Updated: 12/28/22 0425    Narrative:      The order will result in two separate 8-150mbottles  Please do NOT order repeat blood cultures if one has been  drawn within the last 48 hours  UNLESS concerned for  endocarditis  AVOID BLOOD CULTURE DRAWS FROM CENTRAL LINE IF POSSIBLE  Indications:->Pneumonia  ORDER#: [id number]                                  ORDERED BY: GAVear ClockSOURCE: Blood, Venipuncture                          COLLECTED:  12/22/22 22:39  ANTIBIOTICS AT COLL.:                                RECEIVED :  12/23/22 01:59  Culture Blood Aerobic and Anaerobic        FINAL       12/28/22 04:25  12/28/22   No growth after 5 days of incubation.      Respiratory Pathogen Panel w/COVID-19, PCR [[id number]   Order  Status: Canceled     COVID-19 (SARS-CoV-2) and Influenza A/B, NAA (Liat Rapid)- Admission [id number] Collected: 12/21/22 1050    Order Status: Completed Specimen: Culturette from Nasopharyngeal Updated: 12/21/22 1144     Purpose of COVID testing Diagnostic -[facility]     SARS-CoV-2 Specimen Source Nasal Swab     SARS CoV 2 Overall Result Not Detected     Comment: __________________________________________________  -A result of "Detected" indicates POSITIVE for the    presence of SARS CoV-2 RNA  -A result of "Not Detected" indicates NEGATIVE for the    presence of SARS CoV-2 RNA  __________________________________________________________  Test performed using the Roche cobas Liat SARS-CoV-2 assay. This assay is  only for use under the Food and Drug Administration's Emergency Use  Authorization. This is a real-time RT-PCR assay for the qualitative  detection of SARS-CoV-2 RNA. Viral nucleic acids may persist in vivo,  independent of viability. Detection of viral nucleic acid does not imply the  presence of infectious virus, or that virus nucleic acid is the cause of  clinical symptoms. Negative results do not preclude SARS-CoV-2 infection and  should not be used as the sole basis for  diagnosis, treatment or other  patient management decisions. Negative results must be combined with  clinical observations, patient history, and/or epidemiological information.  Invalid results may be due to inhibiting substances in the specimen and  recollection should occur. Please see Fact Sheets for patients and providers  located:  https://www.benson-chung.com/          Influenza A Not Detected     Influenza B Not Detected     Comment: Test performed using the Roche cobas Liat SARS-CoV-2 & Influenza A/B assay.  This assay is only for use under the Food and Drug Administration's  Emergency Use Authorization. This is a multiplex real-time RT-PCR assay  intended for the simultaneous in vitro qualitative detection and  differentiation of SARS-CoV-2, influenza A, and influenza B virus RNA. Viral  nucleic acids may persist in vivo, independent of viability. Detection of  viral nucleic acid does not imply the presence of infectious virus, or that  virus nucleic acid is the cause of clinical symptoms. Negative results do  not preclude SARS-CoV-2, influenza A, and/or influenza B infection and  should not be used as the sole basis for diagnosis, treatment or other  patient management decisions. Negative results must be combined with  clinical observations, patient history, and/or epidemiological information.  Invalid results may be due to inhibiting substances in the specimen and  recollection should occur. Please see Fact Sheets for patients and providers  located: http://olson-hall.info/.         Narrative:      o Collect and clearly label specimen type:  o PREFERRED-Upper respiratory specimen: One Nasal Swab in  Transport Media.  o Hand deliver to laboratory ASAP  Diagnostic -[facility]    Respiratory Pathogen Panel w/COVID-19, PCR [id number] Collected: 12/21/22 0106    Order Status: Canceled Specimen: Nasopharyngeal     Culture Blood Aerobic and Anaerobic [id number] Collected: 12/21/22 0021     Order Status: Completed Specimen: Blood, Venipuncture Updated: 12/26/22 [id number]    Narrative:      Indications:->Other  Other->infection  ORDER#: [id number]                                    ORDERED BY: LEE, SIEW MEI  SOURCE: Blood, Venipuncture RAC  COLLECTED:  12/21/22 00:21  ANTIBIOTICS AT COLL.:                                RECEIVED :  12/21/22 04:06  Culture Blood Aerobic and Anaerobic        FINAL       12/26/22 06:37  12/26/22   No growth after 5 days of incubation.      Culture Blood Aerobic and Anaerobic [id number] Collected: 12/21/22 0021    Order Status: Completed Specimen: Blood, Venipuncture Updated: 12/26/22 1147    Narrative:      [id number] called Micro Results of Pos Bld. Results read back by:[id number], by [id number]  on 12/22/2022 at 07:29  Indications:->Other  Other->infection  ORDER#: [id number]                                    ORDERED BY: LEE, SIEW MEI  SOURCE: Blood, Venipuncture LAC                      COLLECTED:  12/21/22 00:21  ANTIBIOTICS AT COLL.:                                RECEIVED :  12/21/22 04:06  [id number] called Micro Results of Pos Bld. Results read back by:[id number], by [phone] on 12/22/2022 at 07:29  Culture Blood Aerobic and Anaerobic        FINAL       12/26/22 11:47   +  12/22/22   Aerobic Blood Culture Positive in 24 to 48 hours             Gram Stain Shows: Gram positive cocci in clusters  12/26/22   Anaerobic Blood Culture No Growth  12/23/22   Growth of Staphylococcus (coagulase negative)               Possible skin contaminant, susceptibility testing not             performed without request unless additional blood cultures,             collected within 48 hours of this culture, become positive.             Contact the laboratory for further information if necessary.        MRSA culture [id number] Collected: 12/21/22 0000    Order Status: Canceled Specimen: Culturette from Nasal Swab     MRSA culture [id number] Collected: 12/21/22 0000    Order Status: Canceled  Specimen: Culturette from Throat               Cardiac Studies:  Echocardiogram: 12/21/22  Summary    * Limited echo to evaluate biventricular function.    * The left ventricle is moderately dilated with LVIDd 6.8 cm.    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    * Severe global hypokinesis with regional variation.    * The right ventricular cavity size is mildly dilated.    * Moderately decreased right ventricular systolic function.    * There is severe mitral regurgitation which is functional secondary to  dilated left ventricle.    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    * Moderate  pulmonary hypertension with estimated right ventricular systolic  pressure of 51 mmHg.    * Compared to the prior study dated 09/08/22, the EF remains severely  reduced and there is more significant mitral and tricuspid regurgitation.    Right Heart Catheterization: 12/22/22  RA 15/15/11  RV 48/1/14  PA 48/21/28  PCWP 17/18/17  RA sat 58.9 (pulsox 99%  FickCO 4.2 L/min  FickCI 2.0 L/min/m2  ThermCO 2.8 L/min  ThermCI 1.3 L/min/m2  PVR 2.1 wu (Fick) 3.1 wu (Therm)    Imaging:  Radiology Results (24 Hour)       ** No results found for the last 24 hours. **

## 2022-12-29 NOTE — Plan of Care (Signed)
Problem: Hemodynamic Status: Cardiac  Goal: Stable vital signs and fluid balance  Outcome: Not Progressing     Problem: Compromised Sensory Perception  Goal: Sensory Perception Interventions  Outcome: Progressing     Problem: Compromised Moisture  Goal: Moisture level Interventions  Outcome: Progressing     Problem: Compromised Activity/Mobility  Goal: Activity/Mobility Interventions  Outcome: Progressing     Problem: Compromised Nutrition  Goal: Nutrition Interventions  Outcome: Progressing     Problem: Compromised Friction/Shear  Goal: Friction and Shear Interventions  Outcome: Progressing     Problem: Moderate/High Fall Risk Score >5  Goal: Patient will remain free of falls  Outcome: Progressing     Problem: Inadequate Gas Exchange  Goal: Adequate oxygenation and improved ventilation  Outcome: Progressing     Problem: Pain interferes with ability to perform ADL  Goal: Pain at adequate level as identified by patient  Outcome: Progressing     Problem: Side Effects from Pain Analgesia  Goal: Patient will experience minimal side effects of analgesic therapy  Outcome: Progressing
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
## 2022-12-29 NOTE — Progress Notes (Signed)
[name] [name] is a 77 y.o. male who follows with Dr. Mervin Kung at [facility] for management of CLL who is admitted for heart failure.      CLL  no role for treatment at this time-has been on outpt acalabrutinib  Thrombocytopenia  Plt count improved to 78K.  No evidence of HIT.  Doubt immune cause fro CLL.  No need for intervention at this time  DVT.  Agree with low dose SQ heparin given plts greater than 50 and no active bleeding  Cardiogenic shock with EF 10%  As per cardiology

## 2022-12-29 NOTE — Progress Notes (Signed)
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%, NYHA Class 4, ACC/AHA Stage D, LVIDd 6.8 cm  Acute decompensated HF  Narrow complex supraventricular tachycardia, s/p unsuccessful cardioversion 3/4  Coronary artery disease s/p PCI/DES in 2011 with 3 stents and then 2 stents when he was in [place]  S/p Medtronic BiV ICD  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 At this moment from a CLL standpoint, he is quite stable.  Right lower extremity DVT (5/23)   Type 2 diabetes with an A1c 5.9%  Stable COPD  Multiple hospital admissions for acute decompensated heart failure in 2023  DM2  Thrombocytopenia  Iron deficit anemia   Iron studies (3/6): Iron sat 10, Ferritin 57  S/p Iron repletion (iron sucrose 200 mg x5 from 3/6 to 3/10)  Depression   Consult to psych   Aspiration event  Aspirated on 12/22/22, passed swallow eval thereafter       Recommendations:     Cardiogenic shock  Preload: Continue with Bumex gtt at '2mg'$ /hr (failed lasix drip of 20 and 40 mg/hr). Will give Bumex 4 mg and metolazone   Inotropes: Decrease dobutamine to 2.5 mcg/kg/min  Afterload: would target a systolic of 123XX123 mmHg and hold meds if below. Continue hydralazine 100 mg TID and isordil 20 TID  MCS:  currently not needed  Advanced therapies candidacy is undergoing for potential LVAD. Potential barriers are represented by the BiV dysfunction, persistently elevated filling pressures despite adequate CI, CKD, and functional status. With continued worsening in renal function, may not be a candidate for LVAD, awaiting MDT discussion. He is also not able to tolerate anticoagulation due to bleeding issues which is a major barrier for durable MCS.   In case of deterioration a shock call to document not a candidacy  Ischemic Cardiomyopathy, EF 11%  Diuresis as above  GDMT:  Beta Blockers: hold in the setting of shock  ACEi/ARB/ARNI: hold in the setting of worsening kidney function  MRA: hold in the setting of worsening kidney function  SGLT2i: hold in the setting of worsening kidney function  CAD  S/p multiple stents   Right lower extremity DVT in 02/2022  Thrombocytopenia  ASA 81 mg daily, atorvastatin 40 mg daily  At home on eliquis (transitioned to heparin gtt in the hospital), on hold due to bleeding from the lines and ears  Narrow complex tachycardia with failed ablation  NSVT in the last few days  Continue amiodarone 200 mg daily  Rest as per [place] discussed during rounds with primary team and RN    Signed by: Bascom Levels, MD, Cardiology Fellow  Date/Time: 12/29/22 11:15 AM    CICU Cardiology Attending addendum:  Patient seen/examined with cardiology fellow Dr. Murvin Natal.   Exam notable for RRR, normal S1-S2, clear lungs.   24 hour events: PA catheter measurements 4am notable for cardiac index 3.3, SVO2 0.64, CVP 13, PA pressure 62/17  My assessment/plan as noted above with the following additions:    Impressions:  77 year old M with nonischemic cardiomyopathy, EF 20%, CKD, CLL on chemotherapy, prior DVT, CAD s/p multivessel DES to the RCA and LAD in the past, severe functional MR, severe TR, type II DM, initially admitted to [facility] for SVT, then transferred for cardiogenic shock.  CICU course notable for  inotropic therapy with dobutamine, subsequent rising lactate 3/10 on the floor necessitating transfer back to the CICU and replacement of PA catheter for closer hemodynamic monitoring.    Recommendations:  Continue PA catheter guided hemodynamic management as noted. Filling pressures remain high, needs more aggressive diuresis.   Discussion with LVAD selection committee Wednesday  Discussed with advanced HF, likely suboptimal candidate in setting of bleeding issues, thrombocytopenia  Wean dobutamine for goal cardiac index 2-2.5  Holding BB in setting of shock, and ARNI/MRA/SGLT2i in setting of worsening renal failure.    Patient independently examined by me.  Pertinent medical records, studies, labs, imaging studies independently reviewed by me. Treatment of an organ system (cardiac) with high probability of life-threatening deterioration and need for life-saving interventions. Critical care time spent excluding procedures/teaching 30 minutes from 1030AM to 11AM.     Dr. Darrell Jewel. Vena Rua, MD [facility] Attending  [place] [phone]        Reason for consult:     Reason for Admission to CICU (select one)  Primary Cardiac Problem warranting ICU care    Presenting Primary Cardiac Problem (select one)  Cardiogenic shock (etiology not otherwise listed)    Presenting Secondary Cardiac Problem (select one)  Heart failure     Indications (select one)  Cardiogenic shock      History:     77 y.o. 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  He was given a dose of adenosine and had an unsuccessful cardioversion with 120 J.  Adenosine eventually converted to sinus rhythm and telemetry showed a sensed V paced rhythms.      Admitted to ICU at [facility] for further management. However, then transferred to [place] on new Dobutamine 2.5 mcg/kg/min for LVAD consideration. He was diuresed appropriately and then transferred to the medicine floor on dobutamine at 5. Due to increased lactic acidosis, his dobutamine was increased on the floor to 10. Today his creatinine trended up and his urine output decreased, therefore he was readmitted to the CICU for treatment of cardiogenic shock.      Of note follows with [place] [facility] cardiology. Has been followed by [facility] for advanced therapies, including LVAD where he has been seen by Dr. Judeth Horn. Home medications include Eliquis 2.5 mg BID, ASA 81 mg QD, Lipitor 40 mg QHS, Jardiance 10 mg QD, Hydralazine 25 mg Q8H, Isosorbide Dinitrate 10 mg TID, metoprolol succinate XL 12. 5 mg QD, torsemide 40 mg BID, Lantus 50U QD. Was previously on Aldactone but discontinued given renal function. Dry weight is around 185 lb per 10/2022, weight today around 195-199 lb.     Follows with  Dr. Mervin Kung of [facility] for CLL. Dx'd in 06/2020. Thought to be well controlled on acalabrutinib.     Follows with [facility] for CKD. Cr increased from 1.5 in 03/2022 to 2.3 in 09/2022. Previously on HD/UF in 2018.      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  He lives with his wife in [place], previously was living in [place] but due to high cost of living and exorbitant housing rates, he moved to [place].  He has 3 sons and 1 daughter, and 1 grandchild.  They all live in different parts of the [place] and no one is close by.  His primary caregiver is his wife       PMH:    Past Medical History:   Diagnosis Date    Acute CHF     NOV  & DEC 2018, - DIALYSIS CATH PLACED Aug 24 2017 TO REMOVE FLUID     Acute systolic (congestive) heart failure 08/2017    Anxiety     CAD (coronary artery disease)     Cardiomyopathy      nonischemic    CHF (congestive heart failure) 08/12/2014, 2013    Chronic obstructive pulmonary disease     POSSIBLE PER PT HE USES  SYNBICORT BID ABD  BREO INHALER PRN    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    Coronary artery disease 2011    CORONARY STENT PLACEMENT FOLLOWED BY [facility]    Depression     echocardiogram 02/2016, 11/2016, 09/2017, 12/20/2017    GERD (gastroesophageal reflux disease)     Heart attack 2011    and 2014    Hyperlipemia     Hypertension     ICD (implantable 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     PLACED 2014  [facility]  LAST INTERROGATION April 01 2018 REPORT REQUESTED    Ischemic cardiomyopathy     EF 15% ON ECHO 09-20-2017 DR GARG IN EPIC    Nonischemic cardiomyopathy     NSTEMI (non-ST elevated myocardial infarction)     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    Pacemaker 2014    MEDTRONIC ICD/PACEMAKER COMBO LAST INTERROGATION  12-12 2018    Pneumonia 09/2016    Primary cardiomyopathy     Ischemic cardiomyopathy EF 15% ON ECHO 09-20-2017 DR GARG IN EPIC    Sepsis 08/2017    Syncope and collapse     PRIOR TO ICD/PACEMAKER PLACED 2014    Type 2 diabetes mellitus, controlled DX 1998    BS  AVG 100  A1C 7.4 Dec 27 2017    [place]     PT SEE HIS PMD April 01 2018 RE THIS-TOLD TO SEE PMD BY [facility] JUNE 12 WHEN THEY SAW HIM FOR A CL       PSH:    Past Surgical History:   Procedure Laterality Date    BIV  11/10/2016    ICD METRONIC  UPGRADE     CARDIAC CATHETERIZATION  03/2010    CORONARY STENT PLACEMENT X 2 PER PT; LM normal, LAD 70-80% distal lesion at apex, 20% lesion mid CFX    CARDIAC CATHETERIZATION  12/2012    CARDIAC DEFIBRILLATOR PLACEMENT  12/2012    [facility]    CARDIAC PACEMAKER PLACEMENT  2014    MEDTRONIC ICD/PACEMAKER PLACED [facility]    CIRCUMCISION  AGE 89    COLONOSCOPY  2009  CORRECTION HAMMER TOE      duodenal ulcer  1973    STRESS RELATED IN SCHOOL    ECHOCARDIOGRAM, TRANSTHORACIC  02/2016 11/2016 09/2017 12/2017    ECHOCARDIOGRAM, TRANSTHORACIC  02/2016,11/2016,12/20/2017    EGD N/A  08/15/2014    Procedure: EGD;  Surgeon: Loraine Maple, MD;  Location: [facility];  Service: Gastroenterology;  Laterality: N/A;  egd w/ bx    EGD  1975    EGD, COLONOSCOPY N/A 04/04/2018    Procedure: EGD with bxs, COLONOSCOPY with polypectomy and clipping;  Surgeon: Omer Jack, MD;  Location: [facility];  Service: Gastroenterology;  Laterality: N/A;    FRACTURE SURGERY  AGE 40     RT  ANKLE- CAST APPLIED    HERNIA REPAIR  AGE 68    LEFT INGUINAL HERNIA    MPI nuclear study  06/2016    orthopedic surgery  AGE 75    RT foot corrective - HAMMERTOE    RIGHT HEART CATH Right 09/09/2022    Procedure: Right Heart Cath;  Surgeon: Wandra Arthurs, MD;  Location: [facility];  Service: Cardiovascular;  Laterality: Right;  tx from [place] INCL. CO AND SATS Right 12/22/2022    Procedure: Right Heart Cath ONLY incl. CO and Sats;  Surgeon: Day, Lillette Boxer, MD;  Location: [facility] C[facility];  Service: Cardiovascular;  Laterality: Right;  Right Heart Cath with leave in PA cath for milrinone initiation    [place]       FH:  Family History   Problem Relation Age of Onset    Breast cancer Mother     Heart attack Mother 22    Hypertension Mother     Diabetes Mother     Coronary artery disease Mother     Diabetes Brother     Heart attack Father     Hypertension Father        Social History:  Social History     Socioeconomic History    Marital status: Married     Spouse name: Architect    Number of children: 0    Years of education: Not on file    Highest education level: Not on file   Occupational History    Occupation: Pharmacist, 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[employer], history    Tobacco Use    Smoking status: Never    Smokeless tobacco: Never   Vaping Use    Vaping Use: Never used   Substance and Sexual Activity    Alcohol use: Not Currently    Drug use: No    Sexual activity: Yes     Partners: Female   Other Topics Concern    Not on file   Social History Narrative    Not on file      Social Determinants of Health     Financial Resource Strain: Low Risk  (12/22/2022)    Overall Financial Resource Strain (CARDIA)     Difficulty of Paying Living Expenses: Not hard at all   Food Insecurity: No Food Insecurity (09/12/2022)    Hunger Vital Sign     Worried About Running Out of Food in the Last Year: Never true     [place] in the Last Year: Never true   Transportation Needs: No Transportation Needs (12/22/2022)    PRAPARE - Armed forces logistics/support/administrative officer (Medical): No     Lack of Transportation (Non-Medical): No  Physical Activity: Not on file   Stress: Not on file   Social Connections: Not on file   Intimate Partner Violence: Not At Risk (09/12/2022)    Humiliation, Afraid, Rape, and Kick questionnaire     Fear of Current or Ex-Partner: No     Emotionally Abused: No     Physically Abused: No     Sexually Abused: No   Housing Stability: Low Risk  (12/22/2022)    Housing Stability Vital Sign     Unable to Pay for Housing in the Last Year: No     Number of Places Lived in the Last Year: 1     Unstable Housing in the Last Year: No       Allergies:  Allergies   Allergen Reactions    Allopurinol        Home medications:   Prior to Admission medications    Medication Sig Start Date End Date Taking? Authorizing Provider   apixaban (ELIQUIS) 5 MG Take 1 tablet (5 mg) by mouth every 12 (twelve) hours For 1 week followed by 1 tablet twice daily 09/14/22   Irma Newness, MD   aspirin EC 81 MG EC tablet Take 1 tablet (81 mg) by mouth daily    [provider]   atorvastatin (LIPITOR) 40 MG tablet Take 1 tablet (40 mg total) by mouth nightly 07/16/21   Carlis Abbott, MD   Calquence 100 MG Tab  04/07/22   [provider]   hydrALAZINE (APRESOLINE) 25 MG tablet Take 1 tablet (25 mg) by mouth every 8 (eight) hours 09/14/22 12/13/22  Irma Newness, MD   Insulin Glargine-yfgn (Semglee, yfgn,) 100 UNIT/ML Solution Pen-injector 50 Units by Subdermal route daily 10/26/22   Clearance Coots,  MD   isosorbide dinitrate (ISORDIL) 10 MG tablet Take 1 tablet (10 mg) by mouth 3 (three) times daily 09/14/22 12/13/22  Irma Newness, MD   Jardiance 10 MG tablet TAKE 1 TABLET(10 MG) BY MOUTH DAILY 06/05/22   Clotilde Dieter, NP   loratadine (CLARITIN) 10 MG tablet Take 1 tablet (10 mg) by mouth daily 04/18/22   Regis Bill, MD   metoprolol succinate XL (TOPROL-XL) 25 MG 24 hr tablet Take 0.5 tablets (12.5 mg) by mouth daily 05/22/22   Clotilde Dieter, NP   niacin (NIASPAN) 500 MG CR tablet TAKE 1 TABLET BY MOUTH TWICE DAILY  Patient taking differently: 250 mg Taking half a tablet 02/23/22   Clearance Coots, MD   torsemide (DEMADEX) 20 MG tablet Take 2 tablets (40 mg) by mouth 2 (two) times daily 09/14/22   Irma Newness, MD        Inpatient/ER medications:  Medications Prior to Admission   Medication Sig    apixaban (ELIQUIS) 5 MG Take 1 tablet (5 mg) by mouth every 12 (twelve) hours For 1 week followed by 1 tablet twice daily    aspirin EC 81 MG EC tablet Take 1 tablet (81 mg) by mouth daily    atorvastatin (LIPITOR) 40 MG tablet Take 1 tablet (40 mg total) by mouth nightly    Calquence 100 MG Tab     hydrALAZINE (APRESOLINE) 25 MG tablet Take 1 tablet (25 mg) by mouth every 8 (eight) hours    Insulin Glargine-yfgn (Semglee, yfgn,) 100 UNIT/ML Solution Pen-injector 50 Units by Subdermal route daily    isosorbide dinitrate (ISORDIL) 10 MG tablet Take 1 tablet (10 mg) by mouth 3 (three) times daily    Jardiance 10 MG tablet TAKE 1 TABLET(10 MG)  BY MOUTH DAILY    loratadine (CLARITIN) 10 MG tablet Take 1 tablet (10 mg) by mouth daily    metoprolol succinate XL (TOPROL-XL) 25 MG 24 hr tablet Take 0.5 tablets (12.5 mg) by mouth daily    niacin (NIASPAN) 500 MG CR tablet TAKE 1 TABLET BY MOUTH TWICE DAILY (Patient taking differently: 250 mg Taking half a tablet)    torsemide (DEMADEX) 20 MG tablet Take 2 tablets (40 mg) by mouth 2 (two) times daily       Current Inpatient :  Current Facility-Administered Medications   Medication  Dose Route Frequency    [START ON 12/30/2022] amiodarone  200 mg Oral Daily    aspirin EC  81 mg Oral Daily    atorvastatin  40 mg Oral QHS    heparin (porcine)  5,000 Units Subcutaneous Q8H [place]    hydrALAZINE  100 mg Oral Q8H [place]    insulin glargine  33 Units Subcutaneous Q12H    Or    insulin glargine  15 Units Subcutaneous Q12H    insulin lispro  1-6 Units Subcutaneous QHS    insulin lispro  10 Units Subcutaneous TID AC    insulin lispro  2-10 Units Subcutaneous TID AC    isosorbide dinitrate  20 mg Oral TID - Nitrate Free Interval    niacin  500 mg Oral BID    polyethylene glycol  17 g Oral Daily    senna-docusate  1 tablet Oral Q12H SCH      bumetanide 2 mg/hr (12/29/22 0800)    DOBUTamine 2.5 mcg/kg/min (12/29/22 0957)         Objectives:       ROS:  No constitutional complaints, no respiratory complaints, no musculoskeletal complaints, or other pertinent review of symptoms except as noted in the history of present illness.    Vitals:  Vitals:    12/29/22 1020   BP:    Pulse: 83   Resp: (!) 67   Temp:    SpO2: 96%        Intake / Output:    Intake/Output Summary (Last 24 hours) at 12/29/2022 1115  Last data filed at 12/29/2022 1000  Gross per 24 hour   Intake 1488.58 ml   Output 2101 ml   Net -612.42 ml          Weight:      12/28/2022    10:00 PM 12/29/2022     4:00 AM 12/29/2022    10:20 AM   Weight Monitoring   Height 182.9 cm 182.9 cm 182.9 cm   Weight   93.35 kg   Weight Method   Standing Scale   BMI (calculated)   27.9 kg/m2        Weight change:      Physical Exam:  General: Healthy-appearing, in no distress.  Alert and oriented x 3  Eyes: No xanthelasma.  Oropharynx: No mucosal pallor.  Neck: Trachea midline, JVD up to the ear  Pulmonary: crackles at the bases  Cardiovascular: PMI nondisplaced with normal rate and rhythm, normal S1, normal S2, holosystolic murmur   Abdomen: Soft, nontender.  Extremities: no edema, pulses present bilaterally  Skin: multiple excoriations with visible venous small oozing of  blood.    Psych:  Mood was normal    POCUS:  LV: severely dilated with reduced function (visually estimated EF 10% range) and LVIDd 7.0 cm  RV: severe dilated with reduced annular escursion (TAPSE: 1.2 cm RV S' 6 cm/s)  Atria: biatrial  enlargement  Aortic valve: no significant AI visualized, valve appears to open well  Pulmonary Valve: not well visualized  Mitral Valve: likely ventricular functional mitral regurgitation at least moderate at color doppler  Tricuspid valve: likely severe TR in the presence of pacemaker crossing the TV  IVC: dilated to 2.5 cm non compressible  Pericardium: trace pericardial effusion    Pacemaker interrogation:  PPM Interrogation    Device type CRT-D device   Manufacturer Medtronic   Model Claria MRI Quad   Implant date 11/10/2016 (Dr. Efrain Sella)   Indication  Resynchronization and primary prevention     Presenting rhythm:  A-sensed, V-paced  Underlying rhythm:  sinus rhythm with LBBB    Leads  RA 1.50 V @ 0.40 ms 0.30 mV 342 ohms   RV 2.00 V @ 0.40 ms 0.30 mV 285 ohms   LV 2.25 V @ 0.40 ms  513 ohms     Diagnostic Data  A-paced 1.2 %   V-paced 93.6 % (91.9% effective)     High rate episodes  A-episodes None recently   V-episodes Few short NSVT, longer of which was 15 seconds long on December 25 2022     Battery status:  satisfactory (remains 19 months)    Final Parameters:  Mode  DDDR    Lower-Upper Rate 60 - 171 bpm    VT1 <171 bpm Observe only   VT 171 -200 bpm OFF   VF >200bpm shock     Notes / Changes made:  No changes were made           EKG:  A sensed V paced rhythm with normal QRS    Labs:  CBC        12/29/22  0345 12/28/22  0351 12/27/22  0410   WBC 7.51 6.89 6.39   Hgb 9.0* 8.9* 9.6*   Hematocrit 27.3* 26.8* 29.4*   Platelets 78* 69* 67*         BMP        12/29/22  1022 12/29/22  0345 12/28/22  2214 12/26/22  1705 12/26/22  1141 12/25/22  1714 12/25/22  0328 12/24/22  1513 12/24/22  0912   BUN 66.0* 65.0* 66.0*   < >  --    < > 42.0*   < >  --    Creatinine 4.7* 4.7* 4.8*   < >  --     < > 2.7*   < >  --    eGFR 12.2* 12.2* 11.9*   < >  --    < > 23.7*   < >  --    Sodium 130* 130* 130*   < >  --    < > 134*   < >  --    Potassium 4.2 3.9 4.0   < >  --    < > 3.6   < >  --    Chloride 93* 94* 94*   < >  --    < > 98*   < >  --    CO2 '24 23 23   '$ < >  --    < > 24   < >  --    Calcium 8.6 8.6 8.7   < >  --    < > 9.0   < >  --    Magnesium 2.3 2.2 2.3   < >  --    < >  --   --   --  Phosphorus  --   --   --   --   --   --  2.9  --   --    Lactic Acid 1.3 0.8 0.9   < > 2.3*   < > 1.3   < > 1.6   NT-proBNP  --   --   --   --  9,403*  --   --   --   --    LDH  --   --   --   --   --   --   --   --  507*    < > = values in this interval not displayed.         LFTs        12/29/22  0345 12/28/22  0351 12/27/22  0410   AST (SGOT) 28 35 43*   ALT 82* 108* 145*   Alkaline Phosphatase 62 64 75   Bilirubin, Total 1.8* 1.9* 1.7*   Bilirubin Direct 0.7* 0.8* 0.8*   Bilirubin Indirect 1.1* 1.1* 0.9         Coags        12/27/22  0410 12/26/22  2024 12/26/22  0409 12/25/22  0328 12/24/22  1112 12/24/22  0301 12/23/22  1713 12/23/22  1200   PT  --   --   --   --  16.3*  --  17.1*  --    PT INR  --   --   --   --  1.4*  --  1.5*  --    PTT  --   --   --   --  102*  --  33 63*   Anti-Xa, UFH 0.09 0.46 <0.04   < > 0.59   < >  --   --     < > = values in this interval not displayed.       Microbiology  Microbiology Results (last 15 days)       Procedure Component Value Units Date/Time    MRSA culture [id number] Collected: 12/23/22 1200    Order Status: Completed Specimen: Culturette from Nasal Swab Updated: 12/24/22 1311     Culture MRSA Surveillance Negative for Methicillin Resistant Staph aureus    MRSA culture [id number] Collected: 12/23/22 1200    Order Status: Completed Specimen: Culturette from Throat Updated: 12/24/22 1311     Culture MRSA Surveillance Negative for Methicillin Resistant Staph aureus    Culture Blood Aerobic and Anaerobic [id number] Collected: 12/22/22 2239    Order Status: Completed  Specimen: Blood, Venipuncture Updated: 12/28/22 0425    Narrative:      The order will result in two separate 8-27m bottles  Please do NOT order repeat blood cultures if one has been  drawn within the last 48 hours  UNLESS concerned for  endocarditis  AVOID BLOOD CULTURE DRAWS FROM CENTRAL LINE IF POSSIBLE  Indications:->Pneumonia  ORDER#: [id number]                                   ORDERED BY: GVear Clock SOURCE: Blood, Venipuncture                          COLLECTED:  12/22/22 22:39  ANTIBIOTICS AT COLL.:  RECEIVED :  12/23/22 02:00  Culture Blood Aerobic and Anaerobic        FINAL       12/28/22 04:25  12/28/22   No growth after 5 days of incubation.      Culture Blood Aerobic and Anaerobic [id number] Collected: 12/22/22 2239    Order Status: Completed Specimen: Blood, Venipuncture Updated: 12/28/22 0425    Narrative:      The order will result in two separate 8-58m bottles  Please do NOT order repeat blood cultures if one has been  drawn within the last 48 hours  UNLESS concerned for  endocarditis  AVOID BLOOD CULTURE DRAWS FROM CENTRAL LINE IF POSSIBLE  Indications:->Pneumonia  ORDER#: [id number]                                   ORDERED BY: GV[facility] Clock SOURCE: Blood, Venipuncture                          COLLECTED:  12/22/22 22:39  ANTIBIOTICS AT COLL.:                                RECEIVED :  12/23/22 01:59  Culture Blood Aerobic and Anaerobic        FINAL       12/28/22 04:25  12/28/22   No growth after 5 days of incubation.      Respiratory Pathogen Panel w/COVID-19, PCR [[id number]    Order Status: Canceled     COVID-19 (SARS-CoV-2) and Influenza A/B, NAA (Liat Rapid)- Admission [[id number]: 12/21/22 1050    Order Status: Completed Specimen: Culturette from Nasopharyngeal Updated: 12/21/22 1144     Purpose of COVID testing Diagnostic -PUI     SARS-CoV-2 Specimen Source Nasal Swab     SARS CoV 2 Overall Result Not Detected     Comment:  __________________________________________________  -A result of "Detected" indicates POSITIVE for the    presence of SARS CoV-2 RNA  -A result of "Not Detected" indicates NEGATIVE for the    presence of SARS CoV-2 RNA  __________________________________________________________  Test performed using the Roche cobas Liat SARS-CoV-2 assay. This assay is  only for use under the Food and Drug Administration's Emergency Use  Authorization. This is a real-time RT-PCR assay for the qualitative  detection of SARS-CoV-2 RNA. Viral nucleic acids may persist in vivo,  independent of viability. Detection of viral nucleic acid does not imply the  presence of infectious virus, or that virus nucleic acid is the cause of  clinical symptoms. Negative results do not preclude SARS-CoV-2 infection and  should not be used as the sole basis for diagnosis, treatment or other  patient management decisions. Negative results must be combined with  clinical observations, patient history, and/or epidemiological information.  Invalid results may be due to inhibiting substances in the specimen and  recollection should occur. Please see Fact Sheets for patients and providers  located:  hhttps://www.benson-chung.com/         Influenza A Not Detected     Influenza B Not Detected     Comment: Test performed using the Roche cobas Liat SARS-CoV-2 & Influenza A/B assay.  This assay is only for use under the Food and Drug Administration's  Emergency Use Authorization. This is a multiplex real-time RT-PCR assay  intended  for the simultaneous in vitro qualitative detection and  differentiation of SARS-CoV-2, influenza A, and influenza B virus RNA. Viral  nucleic acids may persist in vivo, independent of viability. Detection of  viral nucleic acid does not imply the presence of infectious virus, or that  virus nucleic acid is the cause of clinical symptoms. Negative results do  not preclude SARS-CoV-2, influenza A, and/or influenza B  infection and  should not be used as the sole basis for diagnosis, treatment or other  patient management decisions. Negative results must be combined with  clinical observations, patient history, and/or epidemiological information.  Invalid results may be due to inhibiting substances in the specimen and  recollection should occur. Please see Fact Sheets for patients and providers  located: http://[facility].info/.         Narrative:      o Collect and clearly label specimen type:  o PREFERRED-Upper respiratory specimen: One Nasal Swab in  Transport Media.  o Hand deliver to laboratory ASAP  Diagnostic -[facility]    Respiratory Pathogen Panel w/COVID-19, PCR [id number] Collected: 12/21/22 0106    Order Status: Canceled Specimen: Nasopharyngeal     Culture Blood Aerobic and Anaerobic [id number] Collected: 12/21/22 0021    Order Status: Completed Specimen: Blood, Venipuncture Updated: 12/26/22 [id number]    Narrative:      Indications:->Other  Other->infection  ORDER#: [id number]                                    ORDERED BY: LEE, SIEW MEI  SOURCE: Blood, Venipuncture RAC                      COLLECTED:  12/21/22 00:21  ANTIBIOTICS AT COLL.:                                RECEIVED :  12/21/22 04:06  Culture Blood Aerobic and Anaerobic        FINAL       12/26/22 06:37  12/26/22   No growth after 5 days of incubation.      Culture Blood Aerobic and Anaerobic [id number] Collected: 12/21/22 0021    Order Status: Completed Specimen: Blood, Venipuncture Updated: 12/26/22 1147    Narrative:      [id number] called Micro Results of Pos Bld. Results read back [id number], by [id number]  on 12/22/2022 at 07:29  Indications:->Other  Other->infection  ORDER#: [id number]                                    ORDERED BY: LEE, SIEW MEI  SOURCE: Blood, Venipuncture LAC                      COLLECTED:  12/21/22 00:21  ANTIBIOTICS AT COLL.:                                RECEIVED :  12/21/22 04:06  [id number] called Micro Results of Pos Bld. Results  read back [id number], by [id number] on 12/22/2022 at 07:29  Culture Blood Aerobic and Anaerobic        FINAL       12/26/22 11:47   +  12/22/22   Aerobic Blood Culture Positive in  24 to 48 hours             Gram Stain Shows: Gram positive cocci in clusters  12/26/22   Anaerobic Blood Culture No Growth  12/23/22   Growth of Staphylococcus (coagulase negative)               Possible skin contaminant, susceptibility testing not             performed without request unless additional blood cultures,             collected within 48 hours of this culture, become positive.             Contact the laboratory for further information if necessary.        MRSA culture [id number] Collected: 12/21/22 0000    Order Status: Canceled Specimen: Culturette from Nasal Swab     MRSA culture [id number] Collected: 12/21/22 0000    Order Status: Canceled Specimen: Culturette from Throat               Cardiac Studies:  Echocardiogram: 12/21/22    * Limited echo to evaluate biventricular function.    * The left ventricle is moderately dilated with LVIDd 6.8 cm.    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    * Severe global hypokinesis with regional variation.    * The right ventricular cavity size is mildly dilated.    * Moderately decreased right ventricular systolic function.    * There is severe mitral regurgitation which is functional secondary to  dilated left ventricle.    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    * Compared to the prior study dated 09/08/22, the EF remains severely  reduced and there is more significant mitral and tricuspid regurgitation.      Right Heart Catheterization:   12/27/22 at bedside  CVP  20 mmHg  RV  55/18  PA  56/20 (34)  PCWP  24 with V 28  TC CO/CI 7.0/3.1  SvO2  66.9%  Fick CO/CI 7.3/3.3  PVR  <2      Imaging:  Radiology  Results (24 Hour)       ** No results found for the last 24 hours. **

## 2022-12-29 NOTE — Plan of Care (Signed)
Problem: Compromised Sensory Perception  Goal: Sensory Perception Interventions  Outcome: Progressing     Problem: Compromised Moisture  Goal: Moisture level Interventions  Outcome: Progressing     Problem: Compromised Activity/Mobility  Goal: Activity/Mobility Interventions  Outcome: Progressing     Problem: Compromised Nutrition  Goal: Nutrition Interventions  Outcome: Progressing     Problem: Compromised Friction/Shear  Goal: Friction and Shear Interventions  Outcome: Progressing     Problem: Moderate/High Fall Risk Score >5  Goal: Patient will remain free of falls  Outcome: Progressing     Problem: Hemodynamic Status: Cardiac  Goal: Stable vital signs and fluid balance  Outcome: Progressing     Problem: Inadequate Gas Exchange  Goal: Adequate oxygenation and improved ventilation  Outcome: Progressing     Problem: Pain interferes with ability to perform ADL  Goal: Pain at adequate level as identified by patient  Outcome: Progressing     Problem: Side Effects from Pain Analgesia  Goal: Patient will experience minimal side effects of analgesic therapy  Outcome: Progressing

## 2022-12-29 NOTE — Progress Notes (Signed)
Situation: Chaplain met with the patient today to offer him a spiritual care visit, per the patient's request. During this visit, the chaplain offered the patient emotional and spiritual support, empathic listening, encouragement/affirmation, and a supportive presence.     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 Additionally, the patient identified his faith, family, and the spiritual practice of prayer as some of his sources of strength.     Assessment: At the beginning of this visit, the patient was open to expressing his emotions and spirituality. At the end of this visit, the patient expressed his appreciation for this visit.     Recommendation: Chaplain will follow up as needed. Spiritual care services are available upon request.     Lottie Rater, M.DIV.  Staff [place] Department   Ext: [phone]  Pager: [phone]

## 2022-12-30 ENCOUNTER — Encounter (INDEPENDENT_AMBULATORY_CARE_PROVIDER_SITE_OTHER): Payer: Commercial Managed Care - POS | Admitting: Nurse Practitioner

## 2022-12-30 ENCOUNTER — Inpatient Hospital Stay: Payer: Commercial Managed Care - POS

## 2022-12-30 LAB — BASIC METABOLIC PANEL
Anion Gap: 13 (ref 5.0–15.0)
Anion Gap: 14 (ref 5.0–15.0)
Anion Gap: 13 (ref 5.0–15.0)
Anion Gap: 14 (ref 5.0–15.0)
BUN: 79 mg/dL — ABNORMAL HIGH (ref 9.0–28.0)
BUN: 80 mg/dL — ABNORMAL HIGH (ref 9.0–28.0)
BUN: 79 mg/dL — ABNORMAL HIGH (ref 9.0–28.0)
BUN: 79 mg/dL — ABNORMAL HIGH (ref 9.0–28.0)
CO2: 24 mEq/L (ref 17–29)
CO2: 24 mEq/L (ref 17–29)
CO2: 25 mEq/L (ref 17–29)
CO2: 25 mEq/L (ref 17–29)
Calcium: 8.8 mg/dL (ref 7.9–10.2)
Calcium: 9.1 mg/dL (ref 7.9–10.2)
Calcium: 9.1 mg/dL (ref 7.9–10.2)
Calcium: 9.3 mg/dL (ref 7.9–10.2)
Chloride: 97 mEq/L — ABNORMAL LOW (ref 99–111)
Chloride: 97 mEq/L — ABNORMAL LOW (ref 99–111)
Chloride: 95 mEq/L — ABNORMAL LOW (ref 99–111)
Chloride: 95 mEq/L — ABNORMAL LOW (ref 99–111)
Creatinine: 4.7 mg/dL — ABNORMAL HIGH (ref 0.5–1.5)
Creatinine: 4.6 mg/dL — ABNORMAL HIGH (ref 0.5–1.5)
Creatinine: 4.5 mg/dL — ABNORMAL HIGH (ref 0.5–1.5)
Creatinine: 4.3 mg/dL — ABNORMAL HIGH (ref 0.5–1.5)
Glucose: 132 mg/dL — ABNORMAL HIGH (ref 70–100)
Glucose: 129 mg/dL — ABNORMAL HIGH (ref 70–100)
Glucose: 179 mg/dL — ABNORMAL HIGH (ref 70–100)
Glucose: 128 mg/dL — ABNORMAL HIGH (ref 70–100)
Potassium: 4 mEq/L (ref 3.5–5.3)
Potassium: 4 mEq/L (ref 3.5–5.3)
Potassium: 4.1 mEq/L (ref 3.5–5.3)
Potassium: 3.9 mEq/L (ref 3.5–5.3)
Sodium: 134 mEq/L — ABNORMAL LOW (ref 135–145)
Sodium: 135 mEq/L (ref 135–145)
Sodium: 133 mEq/L — ABNORMAL LOW (ref 135–145)
Sodium: 134 mEq/L — ABNORMAL LOW (ref 135–145)
eGFR: 12.2 mL/min/{1.73_m2} — AB (ref >=60)
eGFR: 12.5 mL/min/{1.73_m2} — AB (ref >=60)
eGFR: 12.8 mL/min/{1.73_m2} — AB (ref >=60)
eGFR: 13.5 mL/min/{1.73_m2} — AB (ref >=60)

## 2022-12-30 LAB — CELL MORPHOLOGY
Cell Morphology: NORMAL
Platelet Estimate: DECREASED — AB

## 2022-12-30 LAB — COOXIMETRY PROFILE
Carboxyhemoglobin: 2.3 % (ref 0.0–3.0)
Carboxyhemoglobin: 2.6 % (ref 0.0–3.0)
Carboxyhemoglobin: 1.5 % (ref 0.0–3.0)
Hematocrit Total Calculated: 30.1 % — ABNORMAL LOW (ref 40.0–54.0)
Hematocrit Total Calculated: 28.4 % — ABNORMAL LOW (ref 40.0–54.0)
Hematocrit Total Calculated: 31.3 % — ABNORMAL LOW (ref 40.0–54.0)
Hemoglobin Total: 9.7 g/dL — ABNORMAL LOW (ref 13.0–17.0)
Hemoglobin Total: 9.2 g/dL — ABNORMAL LOW (ref 13.0–17.0)
Hemoglobin Total: 10.1 g/dL — ABNORMAL LOW (ref 13.0–17.0)
Methemoglobin: 1.2 % (ref 0.0–3.0)
Methemoglobin: 0.6 % (ref 0.0–3.0)
Methemoglobin: 1 % (ref 0.0–3.0)
O2 Content: 8.4 Vol %
O2 Content: 8.9 Vol %
O2 Content: 10.6 Vol %
Oxygenated Hemoglobin: 61.5 % — ABNORMAL LOW (ref 85.0–98.0)
Oxygenated Hemoglobin: 68.6 % — ABNORMAL LOW (ref 85.0–98.0)
Oxygenated Hemoglobin: 74.7 % — ABNORMAL LOW (ref 85.0–98.0)

## 2022-12-30 LAB — CBC AND DIFFERENTIAL
Absolute NRBC: 0.02 10*3/uL — ABNORMAL HIGH (ref 0.00–0.00)
Hematocrit: 26.5 % — ABNORMAL LOW (ref 37.6–49.6)
Hgb: 8.7 g/dL — ABNORMAL LOW (ref 12.5–17.1)
Instrument Absolute Neutrophil Count: 4.03 10*3/uL (ref 1.10–6.33)
MCH: 33.1 pg (ref 25.1–33.5)
MCHC: 32.8 g/dL (ref 31.5–35.8)
MCV: 100.8 fL — ABNORMAL HIGH (ref 78.0–96.0)
MPV: 11.7 fL (ref 8.9–12.5)
Nucleated RBC: 0.2 /100 WBC — ABNORMAL HIGH (ref 0.0–0.0)
Platelets: 85 10*3/uL — ABNORMAL LOW (ref 142–346)
RBC: 2.63 10*6/uL — ABNORMAL LOW (ref 4.20–5.90)
RDW: 17 % — ABNORMAL HIGH (ref 11–15)
WBC: 8.46 10*3/uL (ref 3.10–9.50)

## 2022-12-30 LAB — MAN DIFF ONLY
Atypical Lymphocytes %: 1 %
Atypical Lymphocytes Absolute: 0.08 10*3/uL — ABNORMAL HIGH (ref 0.00–0.00)
Band Neutrophils Absolute: 0 10*3/uL (ref 0.00–1.00)
Band Neutrophils: 0 %
Basophils Absolute Manual: 0 10*3/uL (ref 0.00–0.08)
Basophils Manual: 0 %
Eosinophils Absolute Manual: 0 10*3/uL (ref 0.00–0.44)
Eosinophils Manual: 0 %
Lymphocytes Absolute Manual: 3.64 10*3/uL — ABNORMAL HIGH (ref 0.42–3.22)
Lymphocytes Manual: 43 %
Monocytes Absolute: 0.17 10*3/uL — ABNORMAL LOW (ref 0.21–0.85)
Monocytes Manual: 2 %
Neutrophils Absolute Manual: 4.57 10*3/uL (ref 1.10–6.33)
Segmented Neutrophils: 54 %

## 2022-12-30 LAB — MAGNESIUM
Magnesium: 2.4 mg/dL (ref 1.6–2.6)
Magnesium: 2.2 mg/dL (ref 1.6–2.6)
Magnesium: 2.3 mg/dL (ref 1.6–2.6)
Magnesium: 2.3 mg/dL (ref 1.6–2.6)

## 2022-12-30 LAB — HEPATIC FUNCTION PANEL (LFT)
ALT: 62 U/L — ABNORMAL HIGH (ref 0–55)
AST (SGOT): 26 U/L (ref 5–41)
Albumin/Globulin Ratio: 1 (ref 0.9–2.2)
Albumin: 3.1 g/dL — ABNORMAL LOW (ref 3.5–5.0)
Alkaline Phosphatase: 64 U/L (ref 37–117)
Bilirubin Direct: 0.8 mg/dL — ABNORMAL HIGH (ref 0.0–0.5)
Bilirubin Indirect: 0.7 mg/dL (ref 0.2–1.0)
Bilirubin, Total: 1.5 mg/dL — ABNORMAL HIGH (ref 0.2–1.2)
Globulin: 3 g/dL (ref 2.0–3.6)
Protein, Total: 6.1 g/dL (ref 6.0–8.3)

## 2022-12-30 LAB — LACTIC ACID
Lactic Acid: 0.7 mmol/L (ref 0.2–2.0)
Lactic Acid: 1.1 mmol/L (ref 0.2–2.0)
Lactic Acid: 0.9 mmol/L (ref 0.2–2.0)

## 2022-12-30 LAB — WHOLE BLOOD GLUCOSE POCT
Whole Blood Glucose POCT: 114 mg/dL — ABNORMAL HIGH (ref 70–100)
Whole Blood Glucose POCT: 135 mg/dL — ABNORMAL HIGH (ref 70–100)
Whole Blood Glucose POCT: 166 mg/dL — ABNORMAL HIGH (ref 70–100)
Whole Blood Glucose POCT: 131 mg/dL — ABNORMAL HIGH (ref 70–100)

## 2022-12-30 LAB — URINE IFE REVIEW (SOFT)

## 2022-12-30 MED ORDER — POTASSIUM CHLORIDE CRYS ER 20 MEQ PO TBCR
20.0000 meq | EXTENDED_RELEASE_TABLET | Freq: Once | ORAL | Status: AC
Start: 2022-12-30 — End: 2022-12-30
  Administered 2022-12-30: 20 meq via ORAL
  Filled 2022-12-30: qty 1

## 2022-12-30 MED ORDER — BUMETANIDE 0.25 MG/ML IJ SOLN
4.0000 mg | Freq: Once | INTRAMUSCULAR | Status: AC
Start: 2022-12-30 — End: 2022-12-30
  Administered 2022-12-30: 4 mg via INTRAVENOUS
  Filled 2022-12-30: qty 16

## 2022-12-30 MED ORDER — CHLOROTHIAZIDE SODIUM 500 MG IV SOLR
1000.0000 mg | Freq: Once | INTRAVENOUS | Status: AC
Start: 2022-12-30 — End: 2022-12-30
  Administered 2022-12-30: 1000 mg via INTRAVENOUS
  Filled 2022-12-30: qty 1000

## 2022-12-30 NOTE — Progress Notes (Signed)
[facility].  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  -CAD with past PCI   -Mild hyponatremia due to hypervolemia   -Non gap acidosis due to CKD                Plan:     -serum creatinine rising to 4.7 mg/dL   -Currently on bumex drip at 2 mg/hr               UOP 3.4L, CVP 20  -No need for RRT at this time but may need it soon      -supportive care   -please try to avoid contrast studies and nephrotoxins   -daily labs     [name], MD  Epic Secure Chat: direct or group "[facility]  [place]  Office - [phone]  ++++++++++++++++++++++++++++++++++++++++++++++++++++++++++++++  Chief Complaint:    Interval History:    Medications:  Scheduled Meds:  Current Facility-Administered Medications   Medication Dose Route Frequency    amiodarone  200 mg Oral Daily    aspirin EC  81 mg Oral Daily    atorvastatin  40 mg Oral QHS    bumetanide  4 mg Intravenous Once    chlorothiazide  1,000 mg Intravenous Once    heparin (porcine)  5,000 Units Subcutaneous Q8H Merom    hydrALAZINE  100 mg Oral Q8H Ione    insulin glargine  33 Units Subcutaneous Q12H    Or    insulin glargine  15 Units Subcutaneous Q12H    insulin lispro  1-6 Units Subcutaneous QHS    insulin lispro  10 Units Subcutaneous TID AC    insulin lispro  2-10 Units Subcutaneous TID AC    isosorbide dinitrate  20 mg Oral TID - Nitrate Free Interval    niacin  500 mg Oral BID    polyethylene glycol  17 g Oral Daily    senna-docusate  1 tablet Oral Q12H [place]     Continuous Infusions:   bumetanide 2  mg/hr (12/30/22 0900)     PRN Meds:acetaminophen, dextrose **OR** dextrose **OR** dextrose **OR** glucagon (rDNA), melatonin, phenol, simethicone    Objective:  Vital signs in last 24 hours:  Heart Rate:  [74-87] 81  Resp Rate:  [28-81] 54  BP: (124-128)/(55-61) 125/55  Arterial Line BP: (107-133)/(46-60) 114/46  Intake/Output last 24 hours:    Intake/Output Summary (Last 24 hours) at 12/30/2022 0928  Last data filed at 12/30/2022 0900  Gross per 24 hour   Intake 1126.5 ml   Output 3935 ml   Net -2808.5 ml     Intake/Output this  shift:  I/O this shift:  In: 266.5 [P.O.:240; I.V.:26.5]  Out: 500 [Urine:500]    Physical Exam:   Gen: Well developed, no acute distress   CV: S1 S2 N RRR, no edema   Chest: Good effort, CTAB    Labs:  Recent Labs   Lab 12/30/22  0408 12/29/22  2135 12/29/22  1643 12/29/22  1022 12/29/22  0345 12/28/22  1007 12/28/22  0351 12/25/22  1714 12/25/22  0328   Glucose 132* 193* 242*  More results in Results Review 84  More results in Results Review 91  More results in Results Review 283*   BUN 79.0* 75.0* 69.0*  More results in Results Review 65.0*  More results in Results Review 56.0*  More results in Results Review 42.0*   Creatinine 4.7* 4.8* 4.8*  More results in Results Review 4.7*  More results in Results Review 4.6*  More results in Results Review 2.7*   Calcium 8.8 8.8 8.7  More results in Results Review 8.6  More results in Results Review 8.9  More results in Results Review 9.0   Sodium 134* 132* 131*  More results in Results Review 130*  More results in Results Review 131*  More results in Results Review 134*   Potassium 4.0 4.2 4.3  More results in Results Review 3.9  More results in Results Review 3.7  More results in Results Review 3.6   Chloride 97* 94* 93*  More results in Results Review 94*  More results in Results Review 94*  More results in Results Review 98*   CO2 '24 22 22  '$ More results in Results Review 23  More results in Results Review 23  More results in Results Review 24    Albumin 3.1*  --   --   --  3.1*  --  3.0*  More results in Results Review 3.0*   Phosphorus  --   --   --   --   --   --   --   --  2.9   Magnesium 2.4 2.4 2.4  More results in Results Review 2.2  More results in Results Review 2.1  More results in Results Review  --    More results in Results Review = values in this interval not displayed.     Recent Labs   Lab 12/30/22  0408 12/29/22  0345 12/28/22  0351   WBC 8.46 7.51 6.89   Hgb 8.7* 9.0* 8.9*   Hematocrit 26.5* 27.3* 26.8*   MCV 100.8* 100.4* 98.2*   MCH 33.1 33.1 32.6   MCHC 32.8 33.0 33.2   RDW 17* 17* 17*   MPV 11.7 11.6 11.4   Platelets 85* 78* 69*

## 2022-12-30 NOTE — Progress Notes (Signed)
Situation: Chaplain met with the patient today to offer him spiritual care, per the patient's request. During this visit, the chaplain offered the patient emotional and spiritual support, empathic listening, a supportive presence, affirmation/encouragement, and prayer at the patient's request.     Background: Please see spiritual care service note from  12/29/22 for additional information.     Assessment: At the beginning of this visit, the patient was open to expressing his emotions and spirituality. At the end of this visit, the patient expressed his appreciation for this visit.     Recommendation: Chaplain will follow up as needed. Spiritual care services are available upon request.     Lottie Rater, M.DIV.  Staff [place] Department   Ext: [phone]  Pager: [phone]

## 2022-12-30 NOTE — Consults (Signed)
Advanced Heart Failure and Transplant Consult Note    [employer] (NEW NAME): '[facility] Advanced Heart Failure Inpatient Team'    Primary Advanced HF Cardiologist: Dr. 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 At this moment from a CLL standpoint, he is quite stable.  Right lower extremity DVT (5/23)   Type 2 diabetes with an A1c 5.9%  Stable COPD  Multiple hospital admissions for acute decompensated heart failure in 2023  DM2  Thrombocytopenia  Iron deficit anemia   Iron studies (3/6): Iron sat 10, Ferritin 57  S/p Iron repletion (iron sucrose 200 mg x5 from 3/6 to 3/10)  Depression   Consult to psych   Aspiration event  Aspirated on 12/22/22, passed swallow eval thereafter     Recommendations:       Cardiogenic shock  Preload: Continue with Bumex gtt at 3 mg/hr and metolazone, defer to ICU team regarding transition to bolus intermittent IV regimen   Inotropes: off dobutamine, doing fairly well.   Afterload: continue hydralazine 100 mg TID and isordil 20 TID  MCS: currently not needed  Advanced therapies candidacy is undergoing for potential LVAD. He was declined by the committee on 3/13 due to multiple barriers including: biventricular dysfunction, worsening AKI on stage 4 CKD, limited functional status, and frailty.    Discussed this decision with the patient and wife in detail and patient is amenable to palliative care consultation. Please place formal consult.   Ischemic Cardiomyopathy, EF 11%  Diuresis as above  GDMT:  Beta Blockers: hold in the setting of shock  ACEi/ARB/ARNI: hold in the setting of worsening kidney function  MRA: hold in the setting of worsening kidney function  SGLT2i: hold in the setting of worsening kidney function    Signed by: Pilar Grammes, MD     I have examined this patient and have reviewed the notes, assessments and/or procedures performed by Dr. Maurine Minister. I concur with his documentation of this patient--with these modifications made directly to the note above.     This patient has a high probability of sudden clinically significant deterioration which requires the highest level of physician preparedness to intervene urgently. To summarize, the patient is critically ill due to cardiovascular system failure and we are providing the following life or organ supporting interventions and critical care services: cardiogenic shock that I managed/supervised and that required frequent physician assessments. I devoted my full attention in the ICU to the direct care of this patient for this period of time. I spent 45 minutes providing the critical care services noted and this time is exclusive of teaching, billable procedures, and not overlapping with any other providers.     Cambree Hendrix S. Opal Sidles, MD, MSc, [facility] [place] [place]    Patient has BMI=Body mass index is 27.91 kg/m.  Diagnosis: No additional diagnosis based on BMI criteria     Patient Active Problem List   Diagnosis    Type 2 diabetes mellitus with complication, with long-term current use of insulin    Heart attack    CAD (coronary artery disease)    Non-ischemic cardiomyopathy    Acute exacerbation of CHF (congestive heart failure)    PUD (peptic ulcer disease)    Acute dyspnea    CHF (congestive heart failure)    Headaches due to old head injury    Essential  hypertension    Syncope and collapse    AICD (automatic cardioverter/defibrillator) present    Cardiomyopathy    SOB (shortness of breath)    GERD (gastroesophageal reflux disease)    Hypertensive heart disease without heart failure    Heart failure, unspecified    Nonischemic cardiomyopathy    Pure hypercholesterolemia    Chronic systolic congestive heart failure    Encounter for implantable defibrillator reprogramming or check    Episode of recurrent major depressive disorder, unspecified depression episode severity    Stage 3 chronic kidney disease, 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    Anxiety and depression    COPD (chronic obstructive pulmonary disease)    HLD (hyperlipidemia)    Sleep apnea, obstructive    Scrotal edema    Hydrocele, unspecified hydrocele type    Acute on chronic congestive heart failure, unspecified heart failure type    Acute on chronic right-sided heart failure    Ischemic cardiomyopathy    CLL (chronic lymphocytic leukemia)    SVT (supraventricular tachycardia)    Heart failure    Iron deficiency anemia secondary to inadequate dietary iron intake    Heart failure, unspecified HF chronicity, unspecified heart failure type       Past Medical History:  Past Medical History:   Diagnosis Date    Acute CHF     NOV  & DEC 2018, - DIALYSIS CATH PLACED Aug 24 2017 TO REMOVE FLUID     Acute systolic (congestive) heart failure 08/2017    Anxiety     CAD (coronary artery disease)     Cardiomyopathy     nonischemic    CHF (congestive heart failure) 08/12/2014, 2013    Chronic obstructive pulmonary disease     POSSIBLE PER PT HE USES  SYNBICORT BID ABD  BREO INHALER PRN    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    Coronary artery disease 2011    CORONARY STENT PLACEMENT FOLLOWED BY [facility]    Depression     echocardiogram 02/2016, 11/2016, 09/2017, 12/20/2017    GERD (gastroesophageal reflux disease)     Heart attack 2011    and 2014    Hyperlipemia     Hypertension     ICD (implantable  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     PLACED 2014  [facility]  LAST INTERROGATION April 01 2018 REPORT REQUESTED    Ischemic cardiomyopathy     EF 15% ON ECHO 09-20-2017 DR GARG IN EPIC    Nonischemic cardiomyopathy     NSTEMI (non-ST elevated myocardial infarction)     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    Pacemaker 2014    MEDTRONIC ICD/PACEMAKER COMBO LAST INTERROGATION  12-12 2018    Pneumonia 09/2016    Primary cardiomyopathy     Ischemic cardiomyopathy EF 15% ON ECHO 09-20-2017 DR GARG IN EPIC    Sepsis 08/2017    Syncope and collapse     PRIOR TO ICD/PACEMAKER  PLACED 2014    Type 2 diabetes mellitus, controlled DX 1998    BS  AVG 100  A1C 7.4 Dec 27 2017    [place]     PT SEE HIS PMD April 01 2018 RE THIS-TOLD TO SEE PMD BY [facility] JUNE 12 WHEN THEY SAW HIM FOR A CL        Past Surgical History:  Past Surgical History:   Procedure Laterality Date    BIV  11/10/2016    ICD [place]  UPGRADE     CARDIAC CATHETERIZATION  03/2010    CORONARY STENT PLACEMENT X 2 PER PT; LM normal, LAD 70-80% distal lesion at apex, 20% lesion mid CFX    CARDIAC CATHETERIZATION  12/2012    CARDIAC DEFIBRILLATOR PLACEMENT  12/2012    [facility]    CARDIAC PACEMAKER PLACEMENT  2014    MEDTRONIC ICD/PACEMAKER PLACED  HEART    CIRCUMCISION  AGE 63    COLONOSCOPY  2009    CORRECTION HAMMER TOE      duodenal ulcer  1973    STRESS RELATED IN SCHOOL    ECHOCARDIOGRAM, TRANSTHORACIC  02/2016 11/2016 09/2017 12/2017    ECHOCARDIOGRAM, TRANSTHORACIC  02/2016,11/2016,12/20/2017    EGD N/A 08/15/2014    Procedure: EGD;  Surgeon: Loraine Maple, MD;  Location: [facility];  Service: Gastroenterology;  Laterality: N/A;  egd w/ bx    EGD  1975    EGD, COLONOSCOPY N/A 04/04/2018    Procedure: EGD with bxs, COLONOSCOPY with polypectomy and clipping;  Surgeon: Omer Jack, MD;  Location: [facility];  Service: Gastroenterology;  Laterality: N/A;    FRACTURE SURGERY  AGE 71     RT  ANKLE- CAST APPLIED    HERNIA REPAIR  AGE 63    LEFT INGUINAL HERNIA     MPI nuclear study  06/2016    orthopedic surgery  AGE 54    RT foot corrective - HAMMERTOE    RIGHT HEART CATH Right 09/09/2022    Procedure: Right Heart Cath;  Surgeon: Wandra Arthurs, MD;  [facility];  Service: Cardiovascular;  Laterality: Right;  tx from [place] INCL. CO AND SATS Right 12/22/2022    Procedure: Right Heart Cath ONLY incl. CO and Sats;  Surgeon: Day, Lillette Boxer, MD;  Location: [facility];  Service: Cardiovascular;  Laterality: Right;  Right Heart Cath with leave in PA cath for milrinone initiation    [place]    TUNNELED CATH PLACEMENT (PERMCATH) N/A 12/25/2022    Procedure: [facility];  Surgeon: Estelle Grumbles, MD;  Location: [facility];  Service: Interventional Radiology;  Laterality: N/A;          Current Meds:  amiodarone, 200 mg, Oral, Daily  aspirin EC, 81 mg, Oral, Daily  atorvastatin, 40 mg, Oral, QHS  heparin (porcine), 5,000 Units, Subcutaneous, Q8H SCH  hydrALAZINE, 100 mg, Oral, Q8H SCH  insulin glargine, 33 Units, Subcutaneous, Q12H   Or  insulin glargine, 15 Units, Subcutaneous, Q12H  insulin lispro, 1-6 Units, Subcutaneous, QHS  insulin lispro, 10 Units, Subcutaneous, TID AC  insulin lispro, 2-10 Units, Subcutaneous, TID AC  isosorbide dinitrate, 20 mg, Oral, TID - Nitrate Free Interval  niacin, 500 mg, Oral, BID  polyethylene glycol, 17 g, Oral, Daily  senna-docusate, 1 tablet, Oral, Q12H SCH        Drips:   bumetanide 3 mg/hr (12/30/22 1200)  Home Meds:  Home Medications               apixaban (ELIQUIS) 5 MG     Take 1 tablet (5 mg) by mouth every 12 (twelve) hours For 1 week followed by 1 tablet twice daily     aspirin EC 81 MG EC tablet     Take 1 tablet (81 mg) by mouth daily     atorvastatin (LIPITOR) 40 MG tablet     Take 1 tablet (40 mg total) by mouth nightly     Notes:  PLEASE ADVISE PATIENT TO SCHEDULE OVERDUE FOLLOW-UP WITH GENERAL CARDIOLOGY (DR. Camille Bal) FOR CONTINUED MANAGEMENT/REFILLS.      Calquence 100 MG Tab          hydrALAZINE (APRESOLINE) 25 MG tablet (Expired)     Take 1 tablet (25 mg) by mouth every 8 (eight) hours     Insulin Glargine-yfgn (Semglee, yfgn,) 100 UNIT/ML Solution Pen-injector     50 Units by Subdermal route daily     isosorbide dinitrate (ISORDIL) 10 MG tablet (Expired)     Take 1 tablet (10 mg) by mouth 3 (three) times daily     Jardiance 10 MG tablet     TAKE 1 TABLET(10 MG) BY MOUTH DAILY     loratadine (CLARITIN) 10 MG tablet     Take 1 tablet (10 mg) by mouth daily     metoprolol succinate XL (TOPROL-XL) 25 MG 24 hr tablet     Take 0.5 tablets (12.5 mg) by mouth daily     niacin (NIASPAN) 500 MG CR tablet     TAKE 1 TABLET BY MOUTH TWICE DAILY     Patient taking differently: 250 mg Taking half a tablet     torsemide (DEMADEX) 20 MG tablet     Take 2 tablets (40 mg) by mouth 2 (two) times daily               Allergies:  Allergies   Allergen Reactions    Allopurinol        Family History:  Family History   Problem Relation Age of Onset    Breast cancer Mother     Heart attack Mother 66    Hypertension Mother     Diabetes Mother     Coronary artery disease Mother     Diabetes Brother     Heart attack Father     Hypertension Father        Social History:  Social History     Socioeconomic History    Marital status: Married     Spouse name: Architect    Number of children: 0   Occupational History    Occupation: Therapist, [place], [place]    Tobacco Use    Smoking status: Never    Smokeless tobacco: Never   Vaping Use    Vaping Use: Never used   Substance and Sexual Activity    Alcohol use: Not Currently    Drug use: No    Sexual activity: Yes     Partners: Female     Social Determinants of Health     Financial Resource Strain: Low Risk  (12/22/2022)    Overall Financial Resource Strain (CARDIA)     Difficulty of Paying Living Expenses: Not hard at all   Food Insecurity: No Food Insecurity (09/12/2022)    Hunger Vital Sign     Worried About Running Out of Food in the Last Year: Never  true  Ran Out of Food in the Last Year: Never true   Transportation Needs: No Transportation Needs (12/22/2022)    PRAPARE - Armed forces logistics/support/administrative officer (Medical): No     Lack of Transportation (Non-Medical): No   Intimate Partner Violence: Not At Risk (09/12/2022)    Humiliation, Afraid, Rape, and Kick questionnaire     Fear of Current or Ex-Partner: No     Emotionally Abused: No     Physically Abused: No     Sexually Abused: No   Housing Stability: Low Risk  (12/22/2022)    Housing Stability Vital Sign     Unable to Pay for Housing in the Last Year: No     Number of [place] in the Last Year: 1     Unstable Housing in the Last Year: No          Objective:   Vital Signs in last 24 hours:  Heart Rate:  [74-89] 89  Resp Rate:  [28-81] 30  BP: (101-128)/(52-61) 114/60  Arterial Line BP: (101-133)/(40-60) 129/53  SpO2: 96 %  O2 Device: None (Room air)    Vent Settings  FiO2: (S) 40 %    Telemetry: frequent PVCs and couplets.     Hemodynamics:  PAP: 67/19  PAP (Mean): 37 mmHg  CVP (mmHg): 17 mmHg     CO (L/min): 7 L/min  CI (L/min/m2): 3.2 L/min/m2  Cardiac Output (FICK): 7.89  Cardiac Index (FICK): 3.57  SVR (dyne*sec)/cm5: 619 (dyne*sec)/cm5  SV (mL): 84.3 mL      Intake / Output:    Intake/Output Summary (Last 24 hours) at 12/30/2022 1359  Last data filed at 12/30/2022 1200  Gross per 24 hour   Intake 860.03 ml   Output 4460 ml   Net -3599.97 ml            Weight:      12/29/2022     9:35 PM 12/30/2022     4:08 AM 12/30/2022    10:53 AM   Weight Monitoring   Height 182.9 cm 182.9 cm 182.9 cm        Weight change:        Physical Exam:   General: well-developed, alert, NAD  HEENT: conjunctiva pale, buccal mucosa moist, anicteric sclera, no erythema, JVP ~ 12 cm H2O, no carotid bruits, carotid pulses are present bilaterally  Lungs: CTA B/L, no rhonchi, wheezes or crackles with normal rate and effort  Cardiovascular: S1, S2, normal rate, regular rhythm, holosystolic murmur  Abdominal: soft, NT/ND, +B.S., no  HSM  Extremities: warm to touch, no cyanosis, no edema, peripheral pulses present bilaterally  Neuromuscular exam: alert and oriented x 4, speech fluent     LDAs:  Patient Lines/Drains/Airways Status       Active Lines, Drains and Airways       Name Placement date Placement time Site Days    Implanted Catheter 12/25/22 [place] Tunneled Right Internal jugular 12/25/22  1500  Internal jugular  4    Double Lumen Introducer 12/28/22 Internal jugular Right 12/28/22  0000  Internal jugular  2    Pulmonary Artery Catheter 12/28/22 Internal jugular 12/28/22  0000  -- 2    Peripheral IV 12/21/22 18 G Left Antecubital 12/21/22  2200  Antecubital  8    Peripheral IV 12/26/22 22 G Anterior;Left Forearm 12/26/22  1143  Forearm  4    External Urinary Catheter 12/24/22  2000  --  5    Peripheral Arterial Line  12/27/22 Right Radial 12/27/22  2300  Radial  2                      Labs:  CBC        12/30/22  0408 12/29/22  0345 12/28/22  0351   WBC 8.46 7.51 6.89   Hgb 8.7* 9.0* 8.9*   Hematocrit 26.5* 27.3* 26.8*   Platelets 85* 78* 69*         BMP        12/30/22  1131 12/30/22  1053 12/30/22  0408 12/29/22  2135 12/26/22  1705 12/26/22  1141 12/25/22  1714 12/25/22  0328   BUN 80.0*  --  79.0* 75.0*   < >  --    < > 42.0*   Creatinine 4.6*  --  4.7* 4.8*   < >  --    < > 2.7*   eGFR 12.5*  --  12.2* 11.9*   < >  --    < > 23.7*   Sodium 135  --  134* 132*   < >  --    < > 134*   Potassium 4.0  --  4.0 4.2   < >  --    < > 3.6   Chloride 97*  --  97* 94*   < >  --    < > 98*   CO2 24  --  24 22   < >  --    < > 24   Calcium 9.1  --  8.8 8.8   < >  --    < > 9.0   Magnesium 2.2  --  2.4 2.4   < >  --    < >  --    Phosphorus  --   --   --   --   --   --   --  2.9   Lactic Acid  --  1.1 0.7 1.1   < > 2.3*   < > 1.3   NT-proBNP  --   --   --   --   --  9,403*  --   --     < > = values in this interval not displayed.         LFTs        12/30/22  0408 12/29/22  0345 12/28/22  0351   AST (SGOT) 26 28 35   ALT 62* 82* 108*   Alkaline  Phosphatase 64 62 64   Bilirubin, Total 1.5* 1.8* 1.9*   Bilirubin Direct 0.8* 0.7* 0.8*   Bilirubin Indirect 0.7 1.1* 1.1*         Coags        12/27/22  0410 12/26/22  2024 12/26/22  0409   Anti-Xa, UFH 0.09 0.46 <0.04         Microbiology  Microbiology Results (last 15 days)       Procedure Component Value Units Date/Time    MRSA culture [id number] Collected: 12/23/22 1200    Order Status: Completed Specimen: Culturette from Nasal Swab Updated: 12/24/22 1311     Culture MRSA Surveillance Negative for Methicillin Resistant Staph aureus    MRSA culture [id number] Collected: 12/23/22 1200    Order Status: Completed Specimen: Culturette from Throat Updated: 12/24/22 1311     Culture MRSA Surveillance Negative for Methicillin Resistant Staph aureus    Culture Blood Aerobic and Anaerobic [id number] Collected: 12/22/22 2239    Order Status:  Completed Specimen: Blood, Venipuncture Updated: 12/28/22 0425    Narrative:      The order will result in two separate 8-41m bottles  Please do NOT order repeat blood cultures if one has been  drawn within the last 48 hours  UNLESS concerned for  endocarditis  AVOID BLOOD CULTURE DRAWS FROM CENTRAL LINE IF POSSIBLE  Indications:->Pneumonia  ORDER#: [id number]                                   ORDERED BY: GVear Clock SOURCE: Blood, Venipuncture                          COLLECTED:  12/22/22 22:39  ANTIBIOTICS AT COLL.:                                RECEIVED :  12/23/22 02:00  Culture Blood Aerobic and Anaerobic        FINAL       12/28/22 04:25  12/28/22   No growth after 5 days of incubation.      Culture Blood Aerobic and Anaerobic [[id number]: 12/22/22 2239    Order Status: Completed Specimen: Blood, Venipuncture Updated: 12/28/22 0425    Narrative:      The order will result in two separate 8-180mbottles  Please do NOT order repeat blood cultures if one has been  drawn within the last 48 hours  UNLESS concerned for  endocarditis  AVOID BLOOD CULTURE DRAWS FROM  CENTRAL LINE IF POSSIBLE  Indications:->Pneumonia  ORDER#: [id number]                                  ORDERED BY: GAVear ClockSOURCE: Blood, Venipuncture                          COLLECTED:  12/22/22 22:39  ANTIBIOTICS AT COLL.:                                RECEIVED :  12/23/22 01:59  Culture Blood Aerobic and Anaerobic        FINAL       12/28/22 04:25  12/28/22   No growth after 5 days of incubation.      Respiratory Pathogen Panel w/COVID-19, PCR [[id number]   Order Status: Canceled     COVID-19 (SARS-CoV-2) and Influenza A/B, NAA (Liat Rapid)- Admission [[id number]: 12/21/22 1050    Order Status: Completed Specimen: Culturette from Nasopharyngeal Updated: 12/21/22 1144     Purpose of COVID testing Diagnostic -[facility]     SARS-CoV-2 Specimen Source Nasal Swab     SARS CoV 2 Overall Result Not Detected     Comment: __________________________________________________  -A result of "Detected" indicates POSITIVE for the    presence of SARS CoV-2 RNA  -A result of "Not Detected" indicates NEGATIVE for the    presence of SARS CoV-2 RNA  __________________________________________________________  Test performed using the Roche cobas Liat SARS-CoV-2 assay. This assay is  only for use under the Food and Drug Administration's Emergency Use  Authorization. This is a real-time RT-PCR assay for the qualitative  detection of  SARS-CoV-2 RNA. Viral nucleic acids may persist in vivo,  independent of viability. Detection of viral nucleic acid does not imply the  presence of infectious virus, or that virus nucleic acid is the cause of  clinical symptoms. Negative results do not preclude SARS-CoV-2 infection and  should not be used as the sole basis for diagnosis, treatment or other  patient management decisions. Negative results must be combined with  clinical observations, patient history, and/or epidemiological information.  Invalid results may be due to inhibiting substances in the specimen and  recollection  should occur. Please see Fact Sheets for patients and providers  located:  https://www.benson-chung.com/          Influenza A Not Detected     Influenza B Not Detected     Comment: Test performed using the Roche cobas Liat SARS-CoV-2 & Influenza A/B assay.  This assay is only for use under the Food and Drug Administration's  Emergency Use Authorization. This is a multiplex real-time RT-PCR assay  intended for the simultaneous in vitro qualitative detection and  differentiation of SARS-CoV-2, influenza A, and influenza B virus RNA. Viral  nucleic acids may persist in vivo, independent of viability. Detection of  viral nucleic acid does not imply the presence of infectious virus, or that  virus nucleic acid is the cause of clinical symptoms. Negative results do  not preclude SARS-CoV-2, influenza A, and/or influenza B infection and  should not be used as the sole basis for diagnosis, treatment or other  patient management decisions. Negative results must be combined with  clinical observations, patient history, and/or epidemiological information.  Invalid results may be due to inhibiting substances in the specimen and  recollection should occur. Please see Fact Sheets for patients and providers  located: http://olson-hall.info/.         Narrative:      o Collect and clearly label specimen type:  o PREFERRED-Upper respiratory specimen: One Nasal Swab in  Transport Media.  o Hand deliver to laboratory ASAP  Diagnostic -[facility]    Respiratory Pathogen Panel w/COVID-19, PCR [id number] Collected: 12/21/22 0106    Order Status: Canceled Specimen: Nasopharyngeal     Culture Blood Aerobic and Anaerobic [id number] Collected: 12/21/22 0021    Order Status: Completed Specimen: Blood, Venipuncture Updated: 12/26/22 [id number]    Narrative:      Indications:->Other  Other->infection  ORDER#: [id number]                                    ORDERED BY: LEE, SIEW MEI  SOURCE: Blood, Venipuncture RAC                       COLLECTED:  12/21/22 00:21  ANTIBIOTICS AT COLL.:                                RECEIVED :  12/21/22 04:06  Culture Blood Aerobic and Anaerobic        FINAL       12/26/22 06:37  12/26/22   No growth after 5 days of incubation.      Culture Blood Aerobic and Anaerobic [id number] Collected: 12/21/22 0021    Order Status: Completed Specimen: Blood, Venipuncture Updated: 12/26/22 1147    Narrative:      [id number] called Micro Results of Pos Bld. Results read back by:[id number], by [phone]  on  12/22/2022 at 07:29  Indications:->Other  Other->infection  ORDER#: [id number]                                    ORDERED BY: LEE, SIEW MEI  SOURCE: Blood, Venipuncture LAC                      COLLECTED:  12/21/22 00:21  ANTIBIOTICS AT COLL.:                                RECEIVED :  12/21/22 04:06  [id number] called Micro Results of Pos Bld. Results read back by:[id number], by [phone] on 12/22/2022 at 07:29  Culture Blood Aerobic and Anaerobic        FINAL       12/26/22 11:47   +  12/22/22   Aerobic Blood Culture Positive in 24 to 48 hours             Gram Stain Shows: Gram positive cocci in clusters  12/26/22   Anaerobic Blood Culture No Growth  12/23/22   Growth of Staphylococcus (coagulase negative)               Possible skin contaminant, susceptibility testing not             performed without request unless additional blood cultures,             collected within 48 hours of this culture, become positive.             Contact the laboratory for further information if necessary.        MRSA culture [id number] Collected: 12/21/22 0000    Order Status: Canceled Specimen: Culturette from Nasal Swab     MRSA culture [id number] Collected: 12/21/22 0000    Order Status: Canceled Specimen: Culturette from Throat               Cardiac Studies:  Echocardiogram: 12/21/22  Summary    * Limited echo to evaluate biventricular function.    * The left ventricle is moderately dilated with LVIDd 6.8 cm.    * Left ventricular systolic function is  severely decreased with an ejection  fraction by Biplane Method of Discs of  11 %.    * Severe global hypokinesis with regional variation.    * The right ventricular cavity size is mildly dilated.    * Moderately decreased right ventricular systolic function.    * There is severe mitral regurgitation which is functional secondary to  dilated left ventricle.    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    * Compared to the prior study dated 09/08/22, the EF remains severely  reduced and there is more significant mitral and tricuspid regurgitation.    Right Heart Catheterization: 12/22/22  RA 15/15/11  RV 48/1/14  PA 48/21/28  PCWP 17/18/17  RA sat 58.9 (pulsox 99%  FickCO 4.2 L/min  FickCI 2.0 L/min/m2  ThermCO 2.8 L/min  ThermCI 1.3 L/min/m2  PVR 2.1 wu (Fick) 3.1 wu (Therm)    Imaging:  Radiology Results (24 Hour)       Procedure Component Value Units Date/Time    XR Chest AP Portable [id number] Resulted: 12/30/22 0959    Order Status: Sent Updated: 12/30/22 ID:[id number]  Tunneled Cath Placement (Permcath) [id number] Collected: 12/25/22 1521    Order Status: Completed Updated: 12/29/22 1948    Narrative:      PROCEDURE: Tunneled central venous catheter placement  CLINICAL HISTORY: IV access for home dobutamine    OPERATOR(S):  Attending VIR Physician(s): Erik Obey, MD  Advanced Practice Provider: None    CONTRAST:  Contrast agent: None  Contrast volume (mL): 0    RADIATION PARAMETERS:  Fluoroscopy time (minutes): 2.    Reference air kerma (mGy): 13.4     TECHNIQUE/FINDINGS: Informed consent for the procedure including risks,  benefits and alternatives was obtained. A safety timeout was performed and  documented with all members of the procedure team present, including  verification of patient identification, correct procedure, procedure site,  and laterality. The patient was positioned supine.     Preparation: The site  was prepared and draped using all elements of maximal  sterile barrier technique including sterile gloves, sterile gown, cap,  mask, large sterile sheet, sterile ultrasound probe cover, sterile  ultrasound gel, hand hygiene and cutaneous antisepsis with 2%  chlorhexidine.  Medical reason for site preparation exception: Not applicable.    Sedation: The patient was placed under no additional IV sedation which was  monitored by the performing provider     Sonographic interrogation of the right internal jugular vein demonstrated  patency. Local anesthesia was administered at the access site. A small  dermatotomy with an #11 scalpel was made and venous access was achieved  with a 21 gauge single wall needle. Sonographic images were obtained pre-  and post- puncture for documentation.  A 0.018 inch guidewire was advanced  into the IVC. The needle was exchanged for a 5 French peel-away sheath.  Using fluoroscopic guidance, the 0.018 inch guidewire was removed and  marked at the appropriate length for the intravascular portion of the  catheter and a 0.035 inch wire was then advanced into the inferior vena  cava and an image was saved for documentation.     A catheter exit site was chosen caudal to the clavicle and lateral to the  venotomy site.  This was anesthetized with 1% lidocaine with epinephrine.   The catheter was then tunneled from the exit site to the venotomy site.  It  was fed through a peel-away sheath.  The catheter was introduced into a  vessel over a guidewire.  The catheter tip was positioned under  fluoroscopic guidance.  Both lumina aspirated easily and were flushed.  The  venotomy site was closed with tissue adhesive.  A sterile dressing was  applied.  A fluoroscopic image was obtained to document Hohntip position at  the superior cavoatrial junction.    All indicated images were saved to PACS.      Impression:        Right internal jugular vein approach 6 French single lumen Hohn catheter  tip in the  superior cavoatrial junction.    PLAN:   Catheter is ready for use.    Erik Obey, MD  12/25/2022 3:22 PM

## 2022-12-30 NOTE — Plan of Care (Signed)
Dobutamine gtt D/C'd at 0830. Bumex continues at '3mg'$ /hr. Episode of hypotension (MAP's 60-65) this afternoon, MD notified. Pt remained asymptomatic & has since resolved back to WNL. FICK's continued with 1600 results being CO: 9.58, CI 4.3, SVR 484. Awaiting decision for LVAD placement at this time.       Problem: Hemodynamic Status: Cardiac  Goal: Stable vital signs and fluid balance  Outcome: Not Progressing     Problem: Compromised Sensory Perception  Goal: Sensory Perception Interventions  Outcome: Progressing     Problem: Compromised Moisture  Goal: Moisture level Interventions  Outcome: Progressing     Problem: Compromised Activity/Mobility  Goal: Activity/Mobility Interventions  Outcome: Progressing     Problem: Compromised Nutrition  Goal: Nutrition Interventions  Outcome: Progressing     Problem: Compromised Friction/Shear  Goal: Friction and Shear Interventions  Outcome: Progressing     Problem: Moderate/High Fall Risk Score >5  Goal: Patient will remain free of falls  Outcome: Progressing     Problem: Inadequate Gas Exchange  Goal: Adequate oxygenation and improved ventilation  Outcome: Progressing     Problem: Pain interferes with ability to perform ADL  Goal: Pain at adequate level as identified by patient  Outcome: Progressing     Problem: Side Effects from Pain Analgesia  Goal: Patient will experience minimal side effects of analgesic therapy  Outcome: Progressing

## 2022-12-30 NOTE — Progress Notes (Signed)
CARDIOLOGY CRITICAL CARE CONSULT NOTE      Patient's Name: [name] [name]   Admit Date: 12/21/2022  Medical Record Number: [id number]   Code Status: full code  Room: FI103/FI103-01  Date/Time: 12/30/22 6:41 AM  Cardiologist of record: Dr Judeth Horn    Subjective:  Net 2.4 L negative  Overnight, dobutamine was continued but weaned off in AM  CXR reviewed: pending  Labs with creatinine 4.7 and lactate 0.7.    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 At this moment from a CLL standpoint, he is quite stable.  Right lower extremity DVT (5/23)   Type 2 diabetes with an A1c 5.9%  Stable COPD  Multiple hospital admissions for acute decompensated heart failure in 2023  DM2  Thrombocytopenia  Iron deficit anemia   Iron studies (3/6): Iron sat 10, Ferritin 57  S/p Iron repletion (iron sucrose 200 mg x5 from 3/6 to 3/10)  Depression   Consult to psych   Aspiration event  Aspirated on 12/22/22, passed swallow eval thereafter     Recommendations:     Cardiogenic shock  Preload: Increase Bumex gtt to 3 mg/hr (failed lasix drip of 20 and 40 mg/hr). Received a dose of diuril and Bumex 4 mg bolus last night. So will give him another dose of Bumex 4 mg and Diuril 1g, may repeat one more time.  Inotropes: Discontinue dobutamine 2.5 mcg/kg/min and repeat  Fick CO/CI  Afterload: Continue hydralazine 100 mg TID and isordil 20 TID. would target a systolic of 123XX123 mmHg and hold meds if below.   MCS: currently not needed, and not a candidate either for univentricular support  Advanced therapies candidacy is undergoing for potential LVAD. Potential barriers are represented by the BiV dysfunction, persistently elevated filling pressures despite adequate CI, CKD, and functional status. With continued worsening in renal function, may not be a candidate for LVAD, awaiting MDT discussion. He is also not able to tolerate anticoagulation due to bleeding issues which is a major barrier for durable MCS.   Pending MDT meeting today  In case of deterioration a shock call to document not a candidacy  Ischemic Cardiomyopathy, EF 11%  Diuresis as above  GDMT:  Beta Blockers: hold in the setting of shock  ACEi/ARB/ARNI: hold in the setting of worsening kidney function  MRA: hold in the setting of worsening kidney function  SGLT2i: hold in the setting of worsening kidney function  CAD  S/p multiple stents   Right lower extremity DVT in 02/2022  Thrombocytopenia  ASA 81 mg daily, atorvastatin 40 mg daily  At home on eliquis (transitioned to heparin gtt in the hospital), on hold due to bleeding from the lines and ears  Narrow complex tachycardia with failed ablation  NSVT in the last few days  Continue amiodarone 200 mg daily  Rest as per [facility]  Plan discussed during rounds with primary team and RN    Signed by: Bascom Levels, MD, Cardiology Fellow  Date/Time: 12/30/22 6:41 AM    CICU Cardiology Attending addendum:     Patient seen/examined with cardiology fellow Dr. Murvin Natal.   Exam notable for RRR, normal S1-S2, clear lungs, 1+ bilateral LE edema.   24 hour events: PA catheter measurements from 4 AM notable for cardiac index 3.2, PA pressure 60/17, CVP 15, mixed venous saturation 0.62.  Creatinine 4.7, diuresed 2.2 L net negative  My assessment/plan as noted above with the following  additions:     Impressions:  77 year old M with nonischemic cardiomyopathy, EF 20%, CKD, CLL on chemotherapy, prior DVT, CAD s/p multivessel DES to the RCA and LAD in the past, severe functional MR, severe TR, type II DM, initially admitted to [facility] for SVT, then transferred for cardiogenic shock.  CICU course notable for inotropic therapy with dobutamine, subsequent rising lactate 3/10 on the floor necessitating transfer back to the CICU and replacement of PA catheter for closer hemodynamic monitoring.     Recommendations:  Continue PA catheter guided hemodynamic management as noted. Filling pressures remain high, needs more aggressive diuresis as noted.   Discussion with LVAD selection committee Wednesday  Discussed with advanced HF, potential barriers include biventricular dysfunction, persistently elevated filling pressures despite adequate cardiac indices, advanced CKD, limited functional status, and thrombocytopenia/bleeding.  Wean dobutamine off given CI of 3.2 this morning  Holding BB in setting of shock, and ARNI/MRA/SGLT2i in setting of worsening renal failure.     Patient independently examined by me.  Pertinent medical records, studies, labs, imaging studies independently reviewed by me. Treatment of an organ system (cardiac) with high probability of life-threatening deterioration and need for life-saving interventions. Critical care time spent excluding procedures/teaching 30 minutes from 630AM to 7AM.      Dr. Darrell Jewel. Vena Rua, MD [facility] Attending  [place] [phone]  Reason for consult:     Reason for Admission to CICU (select one)  Primary Cardiac Problem warranting ICU care    Presenting Primary Cardiac Problem (select one)  Cardiogenic shock (etiology not otherwise listed)    Presenting Secondary Cardiac Problem (select one)  Heart failure     Indications (select one)  Cardiogenic shock      History:     77 y.o. 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  He was given a dose of adenosine and had an unsuccessful cardioversion with 120 J.  Adenosine eventually converted to sinus rhythm and telemetry showed a sensed V paced rhythms.     Admitted to ICU at [facility] for further management. However, then transferred to [place] on new Dobutamine 2.5 mcg/kg/min for LVAD consideration. He was diuresed appropriately and then transferred to the medicine floor on dobutamine at 5. Due to increased lactic acidosis, his dobutamine was increased on the floor to 10. Today his creatinine trended up and his urine output decreased, therefore he was readmitted to the CICU for treatment of cardiogenic shock.      Of note follows with [place] [facility]. Has been followed by [facility] Heart Failure for advanced therapies, including LVAD where he has been seen by Dr. Judeth Horn. Home  medications include Eliquis 2.5 mg BID, ASA 81 mg QD, Lipitor 40 mg QHS, Jardiance 10 mg QD, Hydralazine 25 mg Q8H, Isosorbide Dinitrate 10 mg TID, metoprolol succinate XL 12. 5 mg QD, torsemide 40 mg BID, Lantus 50U QD. Was previously on Aldactone but discontinued given renal function. Dry weight is around 185 lb per 10/2022, weight today around 195-199 lb.     Follows with Dr. Mervin Kung of [facility] for CLL. Dx'd in 06/2020. Thought to be well controlled on acalabrutinib.     Follows with [facility] for CKD. Cr increased from 1.5 in 03/2022 to 2.3 in 09/2022. Previously on HD/UF in 2018.      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  He lives with his wife in [place], previously was living in [place] but due to high cost of living and exorbitant  housing rates, he moved to [place].  He has 3 sons and 1 daughter, and 1 grandchild.  They all live in different parts of the [place] and no one is close by.  His primary caregiver is his wife       PMH:    Past Medical History:   Diagnosis Date    Acute CHF     NOV  & DEC 2018, - DIALYSIS CATH PLACED Aug 24 2017 TO REMOVE FLUID     Acute systolic (congestive) heart failure 08/2017    Anxiety     CAD (coronary artery disease)     Cardiomyopathy     nonischemic    CHF (congestive heart failure) 08/12/2014, 2013    Chronic obstructive pulmonary disease     POSSIBLE PER PT HE USES  SYNBICORT BID ABD  BREO INHALER PRN    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    Coronary artery disease 2011    CORONARY STENT PLACEMENT FOLLOWED BY [facility]    Depression     echocardiogram 02/2016, 11/2016, 09/2017, 12/20/2017    GERD (gastroesophageal reflux disease)     Heart attack 2011    and 2014    Hyperlipemia     Hypertension     ICD (implantable 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     PLACED 2014  [facility]  LAST INTERROGATION April 01 2018 REPORT REQUESTED    Ischemic cardiomyopathy     EF 15% ON ECHO 09-20-2017 DR GARG IN EPIC    Nonischemic cardiomyopathy     NSTEMI (non-ST elevated myocardial infarction)     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    Pacemaker 2014    MEDTRONIC ICD/PACEMAKER COMBO LAST INTERROGATION  12-12 2018    Pneumonia 09/2016    Primary cardiomyopathy     Ischemic cardiomyopathy EF 15% ON ECHO 09-20-2017 DR GARG IN EPIC    Sepsis 08/2017    Syncope and collapse     PRIOR TO ICD/PACEMAKER PLACED 2014    Type 2 diabetes mellitus, controlled DX 1998    BS  AVG 100  A1C 7.4 Dec 27 2017    [place]     PT SEE HIS PMD April 01 2018 RE THIS-TOLD TO SEE PMD BY [facility] JUNE 12 WHEN THEY SAW HIM FOR A CL       PSH:    Past Surgical History:   Procedure Laterality Date    BIV  11/10/2016    ICD METRONIC  UPGRADE     CARDIAC CATHETERIZATION  03/2010  CORONARY STENT PLACEMENT X 2 PER PT; LM normal, LAD 70-80% distal lesion at apex, 20% lesion  mid CFX    CARDIAC CATHETERIZATION  12/2012    CARDIAC DEFIBRILLATOR PLACEMENT  12/2012    [facility]    CARDIAC PACEMAKER PLACEMENT  2014    MEDTRONIC ICD/PACEMAKER PLACED [facility]    CIRCUMCISION  AGE 62    COLONOSCOPY  2009    CORRECTION HAMMER TOE      duodenal ulcer  1973    STRESS RELATED IN SCHOOL    ECHOCARDIOGRAM, TRANSTHORACIC  02/2016 11/2016 09/2017 12/2017    ECHOCARDIOGRAM, TRANSTHORACIC  02/2016,11/2016,12/20/2017    EGD N/A 08/15/2014    Procedure: EGD;  Surgeon: Loraine Maple, MD;  Location: [facility];  Service: Gastroenterology;  Laterality: N/A;  egd w/ bx    EGD  1975    EGD, COLONOSCOPY N/A 04/04/2018    Procedure: EGD with bxs, COLONOSCOPY with polypectomy and clipping;  Surgeon: Omer Jack, MD;  Location: [facility];  Service: Gastroenterology;  Laterality: N/A;    FRACTURE SURGERY  AGE 33     RT  ANKLE- CAST APPLIED    HERNIA REPAIR  AGE 62    LEFT INGUINAL HERNIA    MPI nuclear study  06/2016    orthopedic surgery  AGE 81    RT foot corrective - HAMMERTOE    RIGHT HEART CATH Right 09/09/2022    Procedure: Right Heart Cath;  Surgeon: Wandra Arthurs, MD;  [facility];  Service: Cardiovascular;  Laterality: Right;  tx from [place] INCL. CO AND SATS Right 12/22/2022    Procedure: Right Heart Cath ONLY incl. CO and Sats;  Surgeon: Day, Lillette Boxer, MD;  Location: [facility];  Service: Cardiovascular;  Laterality: Right;  Right Heart Cath with leave in PA cath for milrinone initiation    [place]    TUNNELED CATH PLACEMENT (PERMCATH) N/A 12/25/2022    Procedure: [facility];  Surgeon: Estelle Grumbles, MD;  Location: [facility];  Service: Interventional Radiology;  Laterality: N/A;       FH:  Family History   Problem Relation Age of Onset    Breast cancer Mother     Heart attack Mother 53    Hypertension Mother     Diabetes Mother     Coronary artery disease Mother     Diabetes Brother     Heart attack Father      Hypertension Father        Social History:  Social History     Socioeconomic History    Marital status: Married     Spouse name: Architect    Number of children: 0    Years of education: Not on file    Highest education level: Not on file   Occupational History    Occupation: Pharmacist, 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[employer], history    Tobacco Use    Smoking status: Never    Smokeless tobacco: Never   Vaping Use    Vaping Use: Never used   Substance and Sexual Activity    Alcohol use: Not Currently    Drug use: No    Sexual activity: Yes     Partners: Female   Other Topics Concern    Not on file   Social History Narrative    Not on file     Social Determinants of Health     Financial  Resource Strain: Low Risk  (12/22/2022)    Overall Financial Resource Strain (CARDIA)     Difficulty of Paying Living Expenses: Not hard at all   Food Insecurity: No Food Insecurity (09/12/2022)    Hunger Vital Sign     Worried About Running Out of Food in the Last Year: Never true     Ran Out of Food in the Last Year: Never true   Transportation Needs: No Transportation Needs (12/22/2022)    PRAPARE - Armed forces logistics/support/administrative officer (Medical): No     Lack of Transportation (Non-Medical): No   Physical Activity: Not on file   Stress: Not on file   Social Connections: Not on file   Intimate Partner Violence: Not At Risk (09/12/2022)    Humiliation, Afraid, Rape, and Kick questionnaire     Fear of Current or Ex-Partner: No     Emotionally Abused: No     Physically Abused: No     Sexually Abused: No   Housing Stability: Low Risk  (12/22/2022)    Housing Stability Vital Sign     Unable to Pay for Housing in the Last Year: No     Number of Places Lived in the Last Year: 1     Unstable Housing in the Last Year: No       Allergies:  Allergies   Allergen Reactions    Allopurinol        Home medications:   Prior to Admission medications    Medication Sig Start Date End Date Taking? Authorizing Provider   apixaban (ELIQUIS) 5 MG Take 1 tablet (5 mg) by mouth every 12  (twelve) hours For 1 week followed by 1 tablet twice daily 09/14/22   Irma Newness, MD   aspirin EC 81 MG EC tablet Take 1 tablet (81 mg) by mouth daily    [provider]   atorvastatin (LIPITOR) 40 MG tablet Take 1 tablet (40 mg total) by mouth nightly 07/16/21   Carlis Abbott, MD   Calquence 100 MG Tab  04/07/22   [provider]   hydrALAZINE (APRESOLINE) 25 MG tablet Take 1 tablet (25 mg) by mouth every 8 (eight) hours 09/14/22 12/13/22  Irma Newness, MD   Insulin Glargine-yfgn (Semglee, yfgn,) 100 UNIT/ML Solution Pen-injector 50 Units by Subdermal route daily 10/26/22   Clearance Coots, MD   isosorbide dinitrate (ISORDIL) 10 MG tablet Take 1 tablet (10 mg) by mouth 3 (three) times daily 09/14/22 12/13/22  Irma Newness, MD   Jardiance 10 MG tablet TAKE 1 TABLET(10 MG) BY MOUTH DAILY 06/05/22   Clotilde Dieter, NP   loratadine (CLARITIN) 10 MG tablet Take 1 tablet (10 mg) by mouth daily 04/18/22   Regis Bill, MD   metoprolol succinate XL (TOPROL-XL) 25 MG 24 hr tablet Take 0.5 tablets (12.5 mg) by mouth daily 05/22/22   Clotilde Dieter, NP   niacin (NIASPAN) 500 MG CR tablet TAKE 1 TABLET BY MOUTH TWICE DAILY  Patient taking differently: 250 mg Taking half a tablet 02/23/22   Clearance Coots, MD   torsemide (DEMADEX) 20 MG tablet Take 2 tablets (40 mg) by mouth 2 (two) times daily 09/14/22   Irma Newness, MD        Inpatient/ER medications:  Medications Prior to Admission   Medication Sig    apixaban (ELIQUIS) 5 MG Take 1 tablet (5 mg) by mouth every 12 (twelve) hours For 1 week followed by 1 tablet twice daily    aspirin  EC 81 MG EC tablet Take 1 tablet (81 mg) by mouth daily    atorvastatin (LIPITOR) 40 MG tablet Take 1 tablet (40 mg total) by mouth nightly    Calquence 100 MG Tab     hydrALAZINE (APRESOLINE) 25 MG tablet Take 1 tablet (25 mg) by mouth every 8 (eight) hours    Insulin Glargine-yfgn (Semglee, yfgn,) 100 UNIT/ML Solution Pen-injector 50 Units by Subdermal route daily    isosorbide  dinitrate (ISORDIL) 10 MG tablet Take 1 tablet (10 mg) by mouth 3 (three) times daily    Jardiance 10 MG tablet TAKE 1 TABLET(10 MG) BY MOUTH DAILY    loratadine (CLARITIN) 10 MG tablet Take 1 tablet (10 mg) by mouth daily    metoprolol succinate XL (TOPROL-XL) 25 MG 24 hr tablet Take 0.5 tablets (12.5 mg) by mouth daily    niacin (NIASPAN) 500 MG CR tablet TAKE 1 TABLET BY MOUTH TWICE DAILY (Patient taking differently: 250 mg Taking half a tablet)    torsemide (DEMADEX) 20 MG tablet Take 2 tablets (40 mg) by mouth 2 (two) times daily       Current Inpatient :  Current Facility-Administered Medications   Medication Dose Route Frequency    amiodarone  200 mg Oral Daily    aspirin EC  81 mg Oral Daily    atorvastatin  40 mg Oral QHS    heparin (porcine)  5,000 Units Subcutaneous Q8H [place]    hydrALAZINE  100 mg Oral Q8H [place]    insulin glargine  33 Units Subcutaneous Q12H    Or    insulin glargine  15 Units Subcutaneous Q12H    insulin lispro  1-6 Units Subcutaneous QHS    insulin lispro  10 Units Subcutaneous TID AC    insulin lispro  2-10 Units Subcutaneous TID AC    isosorbide dinitrate  20 mg Oral TID - Nitrate Free Interval    niacin  500 mg Oral BID    polyethylene glycol  17 g Oral Daily    senna-docusate  1 tablet Oral Q12H [facility]      bumetanide 2 mg/hr (12/30/22 0600)    DOBUTamine 2.5 mcg/kg/min (12/30/22 0600)         Objectives:       ROS:  No constitutional complaints, no respiratory complaints, no musculoskeletal complaints, or other pertinent review of symptoms except as noted in the history of present illness.    Vitals:  Vitals:    12/30/22 0600   BP:    Pulse: 74   Resp: (!) 74   Temp:    SpO2: 96%        Intake / Output:    Intake/Output Summary (Last 24 hours) at 12/30/2022 0641  Last data filed at 12/30/2022 0600  Gross per 24 hour   Intake 1072.33 ml   Output 3260 ml   Net -2187.67 ml          Weight:      12/29/2022     4:40 PM 12/29/2022     9:35 PM 12/30/2022     4:08 AM   Weight Monitoring   Height  182.9 cm 182.9 cm 182.9 cm        Weight change:      Physical Exam:  General: Healthy-appearing, in no distress.  Alert and oriented x 3  Eyes: No xanthelasma.  Oropharynx: No mucosal pallor.  Neck: Trachea midline, JVD up to the ear  Pulmonary: crackles at the bases  Cardiovascular: PMI  nondisplaced with normal rate and rhythm, normal S1, normal S2, holosystolic murmur   Abdomen: Soft, nontender.  Extremities: no edema, pulses present bilaterally  Skin: multiple excoriations with visible venous small oozing of blood.    Psych:  Mood was normal    POCUS:  LV: severely dilated with reduced function (visually estimated EF 10% range) and LVIDd 7.0 cm  RV: severe dilated with reduced annular escursion (TAPSE: 1.2 cm RV S' 6 cm/s)  Atria: biatrial enlargement  Aortic valve: no significant AI visualized, valve appears to open well  Pulmonary Valve: not well visualized  Mitral Valve: likely ventricular functional mitral regurgitation at least moderate at color doppler  Tricuspid valve: likely severe TR in the presence of pacemaker crossing the TV  IVC: dilated to 2.5 cm non compressible  Pericardium: trace pericardial effusion    Pacemaker interrogation:  PPM Interrogation    Device type CRT-D device   Manufacturer Medtronic   Model Claria MRI Quad   Implant date 11/10/2016 (Dr. Efrain Sella)   Indication  Resynchronization and primary prevention     Presenting rhythm:  A-sensed, V-paced  Underlying rhythm:  sinus rhythm with LBBB    Leads  RA 1.50 V @ 0.40 ms 0.30 mV 342 ohms   RV 2.00 V @ 0.40 ms 0.30 mV 285 ohms   LV 2.25 V @ 0.40 ms  513 ohms     Diagnostic Data  A-paced 1.2 %   V-paced 93.6 % (91.9% effective)     High rate episodes  A-episodes None recently   V-episodes Few short NSVT, longer of which was 15 seconds long on December 25 2022     Battery status:  satisfactory (remains 19 months)    Final Parameters:  Mode  DDDR    Lower-Upper Rate 60 - 171 bpm    VT1 <171 bpm Observe only   VT 171 -200 bpm OFF   VF >200bpm shock      Notes / Changes made:  No changes were made           EKG:  A sensed V paced rhythm with normal QRS    Labs:  CBC        12/30/22  0408 12/29/22  0345 12/28/22  0351   WBC 8.46 7.51 6.89   Hgb 8.7* 9.0* 8.9*   Hematocrit 26.5* 27.3* 26.8*   Platelets 85* 78* 69*         BMP        12/30/22  0408 12/29/22  2135 12/29/22  1643 12/26/22  1705 12/26/22  1141 12/25/22  1714 12/25/22  0328 12/24/22  1513 12/24/22  0912   BUN 79.0* 75.0* 69.0*   < >  --    < > 42.0*   < >  --    Creatinine 4.7* 4.8* 4.8*   < >  --    < > 2.7*   < >  --    eGFR 12.2* 11.9* 11.9*   < >  --    < > 23.7*   < >  --    Sodium 134* 132* 131*   < >  --    < > 134*   < >  --    Potassium 4.0 4.2 4.3   < >  --    < > 3.6   < >  --    Chloride 97* 94* 93*   < >  --    < > 98*   < >  --  CO2 '24 22 22   '$ < >  --    < > 24   < >  --    Calcium 8.8 8.8 8.7   < >  --    < > 9.0   < >  --    Magnesium 2.4 2.4 2.4   < >  --    < >  --   --   --    Phosphorus  --   --   --   --   --   --  2.9  --   --    Lactic Acid 0.7 1.1 1.1   < > 2.3*   < > 1.3   < > 1.6   NT-proBNP  --   --   --   --  9,403*  --   --   --   --    LDH  --   --   --   --   --   --   --   --  507*    < > = values in this interval not displayed.         LFTs        12/30/22  0408 12/29/22  0345 12/28/22  0351   AST (SGOT) 26 28 35   ALT 62* 82* 108*   Alkaline Phosphatase 64 62 64   Bilirubin, Total 1.5* 1.8* 1.9*   Bilirubin Direct 0.8* 0.7* 0.8*   Bilirubin Indirect 0.7 1.1* 1.1*         Coags        12/27/22  0410 12/26/22  2024 12/26/22  0409 12/25/22  0328 12/24/22  1112   PT  --   --   --   --  16.3*   PT INR  --   --   --   --  1.4*   PTT  --   --   --   --  102*   Anti-Xa, UFH 0.09 0.46 <0.04   < > 0.59    < > = values in this interval not displayed.       Microbiology  Microbiology Results (last 15 days)       Procedure Component Value Units Date/Time    MRSA culture [id number] Collected: 12/23/22 1200    Order Status: Completed Specimen: Culturette from Nasal Swab Updated:  12/24/22 1311     Culture MRSA Surveillance Negative for Methicillin Resistant Staph aureus    MRSA culture [id number] Collected: 12/23/22 1200    Order Status: Completed Specimen: Culturette from Throat Updated: 12/24/22 1311     Culture MRSA Surveillance Negative for Methicillin Resistant Staph aureus    Culture Blood Aerobic and Anaerobic [id number] Collected: 12/22/22 2239    Order Status: Completed Specimen: Blood, Venipuncture Updated: 12/28/22 0425    Narrative:      The order will result in two separate 8-43m bottles  Please do NOT order repeat blood cultures if one has been  drawn within the last 48 hours  UNLESS concerned for  endocarditis  AVOID BLOOD CULTURE DRAWS FROM CENTRAL LINE IF POSSIBLE  Indications:->Pneumonia  ORDER#: [id number]                                   ORDERED BY: [facility] Clock SOURCE: Blood, Venipuncture  COLLECTED:  12/22/22 22:39  ANTIBIOTICS AT COLL.:                                RECEIVED :  12/23/22 02:00  Culture Blood Aerobic and Anaerobic        FINAL       12/28/22 04:25  12/28/22   No growth after 5 days of incubation.      Culture Blood Aerobic and Anaerobic [id number] Collected: 12/22/22 2239    Order Status: Completed Specimen: Blood, Venipuncture Updated: 12/28/22 0425    Narrative:      The order will result in two separate 8-27m bottles  Please do NOT order repeat blood cultures if one has been  drawn within the last 48 hours  UNLESS concerned for  endocarditis  AVOID BLOOD CULTURE DRAWS FROM CENTRAL LINE IF POSSIBLE  Indications:->Pneumonia  ORDER#: [id number]                                   ORDERED BY: GV[facility] Clock SOURCE: Blood, Venipuncture                          COLLECTED:  12/22/22 22:39  ANTIBIOTICS AT COLL.:                                RECEIVED :  12/23/22 01:59  Culture Blood Aerobic and Anaerobic        FINAL       12/28/22 04:25  12/28/22   No growth after 5 days of incubation.      Respiratory Pathogen Panel  w/COVID-19, PCR [[id number]    Order Status: Canceled     COVID-19 (SARS-CoV-2) and Influenza A/B, NAA ([place] [facility])- Admission [[id number]: 12/21/22 1050    Order Status: Completed Specimen: Culturette from Nasopharyngeal Updated: 12/21/22 1144     Purpose of COVID testing Diagnostic -PUI     SARS-CoV-2 Specimen Source Nasal Swab     SARS CoV 2 Overall Result Not Detected     Comment: __________________________________________________  -A result of "Detected" indicates POSITIVE for the    presence of SARS CoV-2 RNA  -A result of "Not Detected" indicates NEGATIVE for the    presence of SARS CoV-2 RNA  __________________________________________________________  Test performed using the Roche cobas Liat SARS-CoV-2 assay. This assay is  only for use under the Food and Drug Administration's Emergency Use  Authorization. This is a real-time RT-PCR assay for the qualitative  detection of SARS-CoV-2 RNA. Viral nucleic acids may persist in vivo,  independent of viability. Detection of viral nucleic acid does not imply the  presence of infectious virus, or that virus nucleic acid is the cause of  clinical symptoms. Negative results do not preclude SARS-CoV-2 infection and  should not be used as the sole basis for diagnosis, treatment or other  patient management decisions. Negative results must be combined with  clinical observations, patient history, and/or epidemiological information.  Invalid results may be due to inhibiting substances in the specimen and  recollection should occur. Please see Fact Sheets for patients and providers  located:  hhttps://www.benson-chung.com/         Influenza A Not Detected     Influenza B Not Detected  Comment: Test performed using the Roche cobas Liat SARS-CoV-2 & Influenza A/B assay.  This assay is only for use under the Food and Drug Administration's  Emergency Use Authorization. This is a multiplex real-time RT-PCR assay  intended for the simultaneous in  vitro qualitative detection and  differentiation of SARS-CoV-2, influenza A, and influenza B virus RNA. Viral  nucleic acids may persist in vivo, independent of viability. Detection of  viral nucleic acid does not imply the presence of infectious virus, or that  virus nucleic acid is the cause of clinical symptoms. Negative results do  not preclude SARS-CoV-2, influenza A, and/or influenza B infection and  should not be used as the sole basis for diagnosis, treatment or other  patient management decisions. Negative results must be combined with  clinical observations, patient history, and/or epidemiological information.  Invalid results may be due to inhibiting substances in the specimen and  recollection should occur. Please see Fact Sheets for patients and providers  located: http://[facility].info/.         Narrative:      o Collect and clearly label specimen type:  o PREFERRED-Upper respiratory specimen: One Nasal Swab in  Transport Media.  o Hand deliver to laboratory ASAP  Diagnostic -[facility]    Respiratory Pathogen Panel w/COVID-19, PCR [id number] Collected: 12/21/22 0106    Order Status: Canceled Specimen: Nasopharyngeal     Culture Blood Aerobic and Anaerobic [id number] Collected: 12/21/22 0021    Order Status: Completed Specimen: Blood, Venipuncture Updated: 12/26/22 [id number]    Narrative:      Indications:->Other  Other->infection  ORDER#: [id number]                                    ORDERED BY: LEE, SIEW MEI  SOURCE: Blood, Venipuncture RAC                      COLLECTED:  12/21/22 00:21  ANTIBIOTICS AT COLL.:                                RECEIVED :  12/21/22 04:06  Culture Blood Aerobic and Anaerobic        FINAL       12/26/22 06:37  12/26/22   No growth after 5 days of incubation.      Culture Blood Aerobic and Anaerobic [id number] Collected: 12/21/22 0021    Order Status: Completed Specimen: Blood, Venipuncture Updated: 12/26/22 1147    Narrative:      [id number] called Micro Results of Pos  Bld. Results read back by:[id number], by [id number]  on 12/22/2022 at 07:29  Indications:->Other  Other->infection  ORDER#: [id number]                                    ORDERED BY: LEE, SIEW MEI  SOURCE: Blood, Venipuncture LAC                      COLLECTED:  12/21/22 00:21  ANTIBIOTICS AT COLL.:                                RECEIVED :  12/21/22 04:06  [id number] called Micro Results of Pos Bld. Results read back by:[id number], by  [id number] on 12/22/2022 at 07:29  Culture Blood Aerobic and Anaerobic        FINAL       12/26/22 11:47   +  12/22/22   Aerobic Blood Culture Positive in 24 to 48 hours             Gram Stain Shows: Gram positive cocci in clusters  12/26/22   Anaerobic Blood Culture No Growth  12/23/22   Growth of Staphylococcus (coagulase negative)               Possible skin contaminant, susceptibility testing not             performed without request unless additional blood cultures,             collected within 48 hours of this culture, become positive.             Contact the laboratory for further information if necessary.        MRSA culture [id number] Collected: 12/21/22 0000    Order Status: Canceled Specimen: Culturette from Nasal Swab     MRSA culture [id number] Collected: 12/21/22 0000    Order Status: Canceled Specimen: Culturette from Throat               Cardiac Studies:  Echocardiogram: 12/21/22    * Limited echo to evaluate biventricular function.    * The left ventricle is moderately dilated with LVIDd 6.8 cm.    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    * Severe global hypokinesis with regional variation.    * The right ventricular cavity size is mildly dilated.    * Moderately decreased right ventricular systolic function.    * There is severe mitral regurgitation which is functional secondary to  dilated left ventricle.    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    * Moderate pulmonary hypertension with estimated right  ventricular systolic  pressure of 51 mmHg.    * Compared to the prior study dated 09/08/22, the EF remains severely  reduced and there is more significant mitral and tricuspid regurgitation.      Right Heart Catheterization:   12/27/22 at bedside  CVP  20 mmHg  RV  55/18  PA  56/20 (34)  PCWP  24 with V 28  TC CO/CI 7.0/3.1  SvO2  66.9%  Fick CO/CI 7.3/3.3  PVR  <2      Imaging:  Radiology Results (24 Hour)       Procedure Component Value Units Date/Time    Tunneled Cath Placement (Permcath) [id number] Collected: 12/25/22 1521    Order Status: Completed Updated: 12/29/22 1948    Narrative:      PROCEDURE: Tunneled central venous catheter placement  CLINICAL HISTORY: IV access for home dobutamine    OPERATOR(S):  Attending VIR Physician(s): Erik Obey, MD  Advanced Practice Provider: None    CONTRAST:  Contrast agent: None  Contrast volume (mL): 0    RADIATION PARAMETERS:  Fluoroscopy time (minutes): 2.    Reference air kerma (mGy): 13.4     TECHNIQUE/FINDINGS: Informed consent for the procedure including risks,  benefits and alternatives was obtained. A safety timeout was performed and  documented with all members of the procedure team present, including  verification of patient identification, correct procedure, procedure site,  and laterality. The patient was positioned supine.     Preparation: The site was prepared and  draped using all elements of maximal  sterile barrier technique including sterile gloves, sterile gown, cap,  mask, large sterile sheet, sterile ultrasound probe cover, sterile  ultrasound gel, hand hygiene and cutaneous antisepsis with 2%  chlorhexidine.  Medical reason for site preparation exception: Not applicable.    Sedation: The patient was placed under no additional IV sedation which was  monitored by the performing provider     Sonographic interrogation of the right internal jugular vein demonstrated  patency. Local anesthesia was administered at the access site. A small  dermatotomy with an  #11 scalpel was made and venous access was achieved  with a 21 gauge single wall needle. Sonographic images were obtained pre-  and post- puncture for documentation.  A 0.018 inch guidewire was advanced  into the IVC. The needle was exchanged for a 5 French peel-away sheath.  Using fluoroscopic guidance, the 0.018 inch guidewire was removed and  marked at the appropriate length for the intravascular portion of the  catheter and a 0.035 inch wire was then advanced into the inferior vena  cava and an image was saved for documentation.     A catheter exit site was chosen caudal to the clavicle and lateral to the  venotomy site.  This was anesthetized with 1% lidocaine with epinephrine.   The catheter was then tunneled from the exit site to the venotomy site.  It  was fed through a peel-away sheath.  The catheter was introduced into a  vessel over a guidewire.  The catheter tip was positioned under  fluoroscopic guidance.  Both lumina aspirated easily and were flushed.  The  venotomy site was closed with tissue adhesive.  A sterile dressing was  applied.  A fluoroscopic image was obtained to document Hohntip position at  the superior cavoatrial junction.    All indicated images were saved to PACS.      Impression:        Right internal jugular vein approach 6 French single lumen Hohn catheter  tip in the superior cavoatrial junction.    PLAN:   Catheter is ready for use.    Erik Obey, MD  12/25/2022 3:22 PM

## 2022-12-30 NOTE — Plan of Care (Signed)
Problem: Compromised Sensory Perception  Goal: Sensory Perception Interventions  Outcome: Progressing  Flowsheets (Taken 12/30/2022 2000)  Sensory Perception Interventions: Offload heels, Pad bony prominences, Reposition q 2hrs/turn Clock, Q2 hour skin assessment under devices if present     Problem: Compromised Moisture  Goal: Moisture level Interventions  Outcome: Progressing  Flowsheets (Taken 12/30/2022 2000)  Moisture level Interventions: Moisture wicking products, Moisture barrier cream     Problem: Compromised Activity/Mobility  Goal: Activity/Mobility Interventions  Outcome: Progressing  Flowsheets (Taken 12/30/2022 2000)  Activity/Mobility Interventions: Pad bony prominences, TAP Seated positioning system when OOB, Promote PMP, Reposition q 2 hrs / turn clock, Offload heels     Problem: Moderate/High Fall Risk Score >5  Goal: Patient will remain free of falls  Outcome: Progressing  Flowsheets  Taken 12/30/2022 2000 by Marcial Pacas, RN  High (Greater than 13):   HIGH-Visual cue at entrance to patient's room   HIGH-Bed alarm on at all times while patient in bed   HIGH-Utilize chair pad alarm for patient while in the chair   HIGH-Apply yellow "Fall Risk" arm band  Taken 12/29/2022 2000 by Alm Bustard, RN  Moderate Risk (6-13):   MOD-include family in multidisciplinary POC discussions   MOD-Use gait belt when appropriate   MOD-Request PT/OT consult order for patients with gait/mobility impairment   MOD-Perform dangle, stand, walk (DSW) prior to mobilization   LOW-Fall Interventions Appropriate for Low Fall Risk   LOW-Anticoagulation education for injury risk   MOD-Apply bed exit alarm if patient is confused  Taken 12/23/2022 1548 by Helene Kelp, RN  VH High Risk (Greater than 13):   ALL REQUIRED LOW INTERVENTIONS   ALL REQUIRED MODERATE INTERVENTIONS   RED "HIGH FALL RISK" SIGNAGE     Problem: Hemodynamic Status: Cardiac  Goal: Stable vital signs and fluid balance  Outcome: Progressing  Flowsheets  (Taken 12/27/2022 1836 by Derrel Nip, RN)  Stable vital signs and fluid balance:   Assess signs and symptoms associated with cardiac rhythm changes   Monitor lab values

## 2022-12-30 NOTE — Progress Notes (Incomplete)
Cardiac Intensive Care Unit (CICU)  Progress Note      Patient Name: [name] [name]  MRN: [id number]  Room: FI103/FI103-01    ICU Day: 2d 9h    Interval Events     24 hour events:   -No acute events     Subjective     [name] [name] is doing well this morning. He states he has some bilateral calf pain from laying on the bed all day. He was only able to sleep 5 hours. He is eating and having regular BMs.    Hospital Course:  3/4: RHC placed.   3/5: Diuresis, good UOP.   3/6: Started vanc and zosyn. Blood culture positive, repeat sent. RML possible infiltrate on CXR. CI: 1.7 overnight, lactate cleared. Wife to bring in CLL medication.   3/7: Improving CI overnight. Occasional 8 beat runs once or twice per shift. Antibiotics discontinued.   3/8: Lasix 120 at 9pm last night. Reports of Swan issues, some higher PAP due to line issues.  Cardiac index stabile, lactic acid clear  3/10: Lactate up to 2.7 with decreasing UOP and Cr up to 4.6. Readmitted to the CICU.   3/11: Continued to diurese. Dobutamine decreased to 2.5.     Home medications include Eliquis 2.5 mg BID, ASA 81 mg QD, Lipitor 40 mg QHS, Jardiance 10 mg QD, Hydralazine 25 mg Q8H, Isosorbide Dinitrate 10 mg TID, metoprolol succinate XL 12. 5 mg QD, torsemide 40 mg BID, Lantus 50U QD. Was previously on Aldactone but discontinued given renal function. Dry weight is around 185 lb per 10/2022     . bumetanide 2 mg/hr (12/30/22 0700)   . DOBUTamine 2.5 mcg/kg/min (12/30/22 0700)        Assessment & Plan   Assessment and Plan   Summary: [name] [name] a 77 y.o. 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  -Inotropy: dobutamine decreased to 2.5 mg/hr -Afterload: hydralazine '100mg'$  TID, isordil '20mg'$  TID.  -MCS: [employer] ICD  -Advanced Therapies: undergoing eval for LVAD, delayed by worsening renal function      # Heart Failure   Diurese as above  GDMT:  Beta Blockers: hold in the setting of shock  ACEi/ARB/ARNI: hold in the setting of worsening kidney function  MRA: hold in the setting of worsening kidney function  SGLT2i: hold in the setting of worsening kidney function    # CAD s/p DES 2011  Continue aspirin '81mg'$  daily   Continue atorvastatin '40mg'$     # NSVT s/p unsuccessful cardioversion 3/4   Converted to NSR s/p adenosine.   Amiodarone '400mg'$  TID PO (last dose 3/12 then transition to '200mg'$  QD dosing)    # RLE DVT on home Eliquis  Therapeutic heparin for DVT held in the setting of thrombocytopenia  and bleeding from insertion sites    MAP goal 65-75     PULMONARY:   No acute concerns    Last vent settings if on vent:     Ventilator Time (if on vent):       Supplemental oxygen as needed to keep sat > 92%   RENAL:   Acute on chronic renal failure      Intake/Output Summary (Last 24 hours) at  12/30/2022 0755  Last data filed at 12/30/2022 0700  Gross per 24 hour   Intake 1010 ml   Output 3485 ml   Net -2475 ml      # Acute on Chronic Kidney Disease   CKD 3B; 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     * No values recorded between 12/25/2022  2:07 PM and 12/30/2022  7:55 AM *  Lab Results   Component Value Date    WBC 8.46 12/30/2022      CAUTI Prevention (n/a if blank):  Foley Day:         Blood Steam Infection Prevention (n/a if blank):   Implanted Catheter 12/25/22 Hohn Tunneled Right Internal jugular  Peripheral Arterial Line 12/27/22 Right Radial  Pulmonary Artery Catheter 12/28/22 Internal jugular  Double Lumen Introducer 12/28/22 Internal jugular Right   Last culture dates: blood cultures 12/23/22 TGTD x5 days           HAI Prevention Plan  Initiate/Maintain Central Line (CVC/PICC) for titration of cardiac medications       HEME/ONC:   Chronic Lymphocytic Leukemia  Thrombocytopenia     # Thrombocytopenia   -Unclear etiology.  -Nutritional studies and hemolysis labs normal, HIT Ab negative, SRA in process.   -Per Oncology team: "Suspect likely due to acute illness with cardiogenic shock and AKI necessitating inotropic support, less likely immune thrombocytopenia in the setting of CLL given overall good control of disease."  Heme-Onc following  CTM  Plt count >50k in the absence of bleeding   Therapeutic heparin for DVT held in the setting of thrombocytopenia     # Chronic Lymphocytic Leukemia  Initiated on acalabrutinib 04/2022 for worsening leukocytosis and adenopathy.  Acalabrutinib held in the setting of thrombocytopenia  Heme-Onc  following         SCDs  DVT chemoprophylaxis: heparin (porcine) injection 5,000 Units      ENDO/RHEUM:  Type II Diabetes  Goal glucose range 100 - 180 WNL 3/12       Current Infusions (n/a if blank):   . bumetanide 2 mg/hr (12/30/22 0700)   . DOBUTamine 2.5 mcg/kg/min (12/30/22 0700)          Advance Care Planning: Open ACP Navigator  Code Status: Full Code  Next of Kin:  Primary Emergency Contact: Raffield,Octavia  {Vanishing Tip This patient does not have an Advance Care Planning Note. If you have completed a goals of care discussion or advanced care planning meeting please use the ACPNOTE SmartBlock to document. [id number]         ADVANCE CARE PLAN                     Objective      Examination:   Heart Rate:  [74-87] 78  Resp Rate:  [22-81] 54  BP: (107-128)/(55-61) 125/55  Arterial Line BP: (107-133)/(47-60) 126/55  SpO2: 94-98% on RA    FICK:   -CVP: 15  -PAP: 64/18  -CI: 3.2   -SVR 644  -SvO2: 0.62  On dobutamine     I/Os:   3.5 L urine     Neuro:    alert & oriented x 3 , appropriate mood and affect    Lungs:   clear to auscultation, no wheezes, rales or rhonchi, symmetric air entry     Cardiac:   normal rate, regular rhythm, normal S1, S2, no murmurs, rubs, clicks or gallops, JVD 15cmH2O    Abdomen:    soft, nontender, nondistended, no masses or organomegaly     Extremities:   peripheral pulses normal no pedal edema, pain with palpation of calf bilaterally. No edema or redness noted.     Skin:     Warm, dry and intact. Incisions clean and dry with no pus or drainage.    Fick/Cardiac Hemodynamics   VO2 Determination  Height: 182.9 cm (6' 0.01")  Weight FICK (only): 96 kg (211 lb 10.3 oz)  BSA (Fick only) (Calculated - sq m): 2.21 sq meters  VO2: 276.25    CI (L/min/m2): 3.2 L/min/m2    CO/CI Determination  SaO2 : 0.95  SvO2 : 0.62  SaO2 - SvO2: 0.33  HGB Result: 8.7  x 1.36 x 10: 39.05  Cardiac Output (FICK): 7.07  Cardiac Index (FICK): 3.2  Cardiac Power Output: 1.13    PVR Determination  PAP: 66/20  PAP  (Mean): 32 mmHg  PAWP (mmHg): 18 mmHg  Pulmonary Artery Pulsatility Index (PAPI): 2.56    SVR Determination  MAP (mmHg): 72  CVP (mmHg): 18 mmHg  MAP - CVP: 57  SVR (dynes/second/cm3): 644.98 dynes/second/cm3       Most Recent Labs  8.46 \ 8.7* / 85*    / 26.5* \    03/13 0408    134* 97* 79.0* / 132*   4.0 24 4.7* \    03/13 0408            Additional Diagnoses:   {Vanishing Tip  Patient meets BMI criteria for a weight-based diagnosis. 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 [id number]  Patient has BMI=Body mass index is 27.91 kg/m.  Diagnosis: Overweight based on BMI criteria      {Vanishing Tip  Platelet value is  low  100k-149k = Mild Thrombocytopenia  50k-99k = Moderate Thrombocytopenia  <50k = Severe Thrombocytopenia :[id number]        Recent Labs     12/30/22  0408 12/29/22  0345 12/28/22  0351   Platelets 85* 78* 69*     Diagnosis: Moderate Thrombocytopenia         {Vanishing Tip  Sodium value is low  130-134 mEq/L = Mild Hyponatremia  120-129 mEq/L = Moderate Hyponatremia  <120 mEq/L = Severe Hyponatremia :[id number]        Recent Labs     12/30/22  0408 12/29/22  2135 12/29/22  1643 12/29/22  1022 12/29/22  0345 12/28/22  2214 12/28/22  1638 12/28/22  1007 12/28/22  0351 12/27/22  1706   Sodium 134* 132* 131* 130* 130* 130* 129* 130* 131* 132*     Diagnosis: No additional diagnosis          {Vanishing Tip Patient has a Hgb level below the normal lab reference range at [facility].  [facility] Reference Values    Male Hgb <11.3 is abnormal   Male      Hgb < 12.5 is abnormal. :[id number]        Recent Labs   Lab 12/30/22  0408 12/29/22  0345 12/28/22  0351   Hgb 8.7* 9.0* 8.9*   Hematocrit 26.5* 27.3* 26.8*   MCV 100.8* 100.4* 98.2*   WBC 8.46 7.51 6.89   Platelets 85* 78* 69*     Recent Labs   Lab 12/24/22  1513 12/24/22  0912 12/22/22  1430  12/21/22  2247   Ferritin  --   --  57.00  --    Iron  --   --   --  32*   TIBC  --   --   --  307   Iron Saturation  --   --   --  10*   Folate 12.6  --   --   --    Haptoglobin  --  165  --   --      Anemia Diagnosis: No new diagnosis

## 2022-12-30 NOTE — Progress Notes (Signed)
I have reviewed the provider's history, physical exam, assessment and management plans as per the ICU resident/APP's note. I have seen and examined this patient.  To summarize, the patient is critically ill due to cardiogenic shock and we are providing the following critical care services: invasive hemodynamic monitoring, inotropic support.     77 yo with mixed ICM/NICM, CLL and CKD presentes to the hospital with cardiogenic shock and started on inotropic support    3/13- dobutamine stopped this morning    This patient has a high probability of sudden clinically significant deterioration which requires the highest level of physician preparedness to intervene urgently. I managed/supervised life or organ supporting interventions that required frequent physician assessments. I devoted my full attention in the ICU to the direct care of this patient for this period of time. Organ systems that require intensive critical care support include: CV     Assessment  Cardiogenic shock  Acute on chronic systolic heart failure  AKI on CKD likely ATN due to cardiogenic shock  CLL  Thrombocytopenia likely due to above    Neuro: frailty  -PTOT  -ICU delirium precautions    CV: cardiogenic shock  -repeat fick numbers off dobutamine  -AHF following for LVAD candidacy evaluation  -cont diuresis  -cont amio, ASA, statin, hydralazine and isosorbide dinitrate (no ACE/ARB/ARNI due to acute renal failure)    Pulm: goal sat 92%    ID: no active problems    Renal/Fluid, Electrolytes: AKI on CKD with no improvement with inotropic support, nonoliguric  -poor candidate for RRT if there is no advanced options for his heart failure as it is endstage heart failure  -monitor UOP  -nephrology following  -replete lytes PRN  -cont to diurese    Heme: chronic anemia    GI/Nurition: cardiac diet    Endo: maintain euglycemia    Lines/Tubes:   RIJ introducer with PAC  PIVs  External urinary catheter      PPx:   -DVT: heparin TID   -GI: none needed  Code  status: full code      Any critical care time was performed today and is exclusive of teaching, billable procedures, and not overlapping with any other providers.   Critical care time: 41 minutes.

## 2022-12-30 NOTE — Progress Notes (Signed)
Case Management Progress Note     Date: 12/30/2022    Patient: [facility] Problems    Diagnosis    Iron deficiency anemia secondary to inadequate dietary iron intake    Heart failure    Heart failure, unspecified HF chronicity, unspecified heart failure type    Ischemic cardiomyopathy    Acute on chronic right-sided heart failure    Chronic systolic congestive heart failure    Nonischemic cardiomyopathy    Non-ischemic cardiomyopathy       Length of stay: Hospital Day 9        Discharge plan: Home with family / supervision, family to transport.     Anticipated date of discharge: tbd    Barriers to discharge: Continuous Bumex gtt. Heparin.     CM continuing to peripherally follow for discharge needs.     Lake Bells BSN, RN  [facility] Management  RN Case Manager I  [address]  [place], [place] [address]

## 2022-12-30 NOTE — Progress Notes (Signed)
Cardiac Intensive Care Unit (CICU)  Progress Note      Patient Name: [name] [name]  MRN: [id number]  Room: FI103/FI103-01    ICU Day: 2d 9h      Interval Events     24 hour events:   -No acute events     Subjective     [name] [name] is doing well this morning. No acute issues.    Hospital Course:  3/4: RHC placed.   3/5: Diuresis, good UOP.   3/6: Started vanc and zosyn. Blood culture positive, repeat sent. RML possible infiltrate on CXR. CI: 1.7 overnight, lactate cleared. Wife to bring in CLL medication.   3/7: Improving CI overnight. Occasional 8 beat runs once or twice per shift. Antibiotics discontinued.   3/8: Lasix 120 at 9pm last night. Reports of Swan issues, some higher PAP due to line issues.  Cardiac index stabile, lactic acid clear  3/10: Lactate up to 2.7 with decreasing UOP and Cr up to 4.6. Readmitted to the CICU.   3/11: Continued to diurese. Dobutamine decreased to 2.5.   3/12: continued diuresis and dobutamine stopped    Home medications include Eliquis 2.5 mg BID, ASA 81 mg QD, Lipitor 40 mg QHS, Jardiance 10 mg QD, Hydralazine 25 mg Q8H, Isosorbide Dinitrate 10 mg TID, metoprolol succinate XL 12. 5 mg QD, torsemide 40 mg BID, Lantus 50U QD. Was previously on Aldactone but discontinued given renal function. Dry weight is around 185 lb per 10/2022     Assessment & Plan   Assessment and Plan   Summary: [name] [name] a 77 y.o. 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 Currently now off dobutamine.     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  -Inotropy: dobutamine stopped   -Afterload: hydralazine '100mg'$  TID, isordil '20mg'$  TID.  -MCS: [employer]tron[employer] ICD  -Advanced Therapies: undergoing eval for LVAD, delayed by worsening renal function      # Heart Failure   Diurese as above  GDMT:  Beta Blockers: hold in the setting of shock  ACEi/ARB/ARNI: hold in the setting of worsening kidney function  MRA: hold in the setting of worsening kidney function  SGLT2i: hold in the setting of worsening kidney function    #  CAD s/p DES 2011  Continue aspirin '81mg'$  daily   Continue atorvastatin '40mg'$     # NSVT s/p unsuccessful cardioversion 3/4   Converted to NSR s/p adenosine.   Amiodarone '200mg'$  QD    # RLE DVT on home Eliquis  Therapeutic heparin for DVT held in the setting of thrombocytopenia  and bleeding from insertion sites    MAP goal 65-75     PULMONARY:   No acute concerns    Last vent settings if on vent:     Ventilator Time (if on vent):       Supplemental oxygen as needed to keep sat > 92%   RENAL:   Acute on chronic renal failure      Intake/Output Summary (Last 24 hours) at 12/30/2022 [id number]  Last data filed at 12/30/2022 0700  Gross per 24 hour   Intake 875 ml   Output 3435 ml   Net -2560 ml        # Acute on Chronic Kidney Disease   CKD 3B; baseline serum creatinine around 2.5 mg/dL  Nephrology following:             -No  indication for RRT at this time   GASTROINTESTINAL:  No acute concerns    Last Bowel Movement:   Last BM Date: 12/29/22   Nutrition: Heart Healthy diet changed to regular diet to promote caloric intake  Bowel Regimen: miralax and senna, regular BMs   LFTs improving     INFECTIOUS DISEASE:   No acute concerns     No data recorded.     * No values recorded between 12/25/2022  2:07 PM and 12/30/2022  8:08 AM *  Lab Results   Component Value Date    WBC 8.46 12/30/2022      CAUTI Prevention (n/a if blank):  Foley Day:         Blood Steam Infection Prevention (n/a if blank):   Implanted Catheter 12/25/22 Hohn Tunneled Right Internal jugular  Peripheral Arterial Line 12/27/22 Right Radial  Pulmonary Artery Catheter 12/28/22 Internal jugular  Double Lumen Introducer 12/28/22 Internal jugular Right   Last culture dates: blood cultures 12/23/22 TGTD x5 days           HAI Prevention Plan  Initiate/Maintain Central Line (CVC/PICC) for titration of cardiac medications       HEME/ONC:   Chronic Lymphocytic Leukemia  Thrombocytopenia     # Thrombocytopenia   -Unclear etiology.  -Nutritional studies and hemolysis labs normal, HIT Ab negative, SRA in process.   -Per Oncology team: "Suspect likely due to acute illness with cardiogenic shock and AKI necessitating inotropic support, less likely immune thrombocytopenia in the setting of CLL given overall good control of disease."  Heme-Onc following  CTM  Plt count >50k in the absence of bleeding   Therapeutic heparin for DVT held in the setting of thrombocytopenia     # Chronic Lymphocytic Leukemia  Initiated on acalabrutinib 04/2022 for worsening leukocytosis and adenopathy.  Acalabrutinib held in the setting of thrombocytopenia  Heme-Onc following         SCDs  DVT chemoprophylaxis: heparin (porcine) injection 5,000 Units      ENDO/RHEUM:  Type II Diabetes   Goal glucose range 100 - 180 WNL        Current Infusions (n/a if blank):    bumetanide 3 mg/hr (12/30/22 1126)           Objective       Examination:   Heart Rate:  [  74-86] 80  Resp Rate:  [28-81] 30  BP: (101-128)/(52-61) 101/52  Arterial Line BP: (101-133)/(40-60) 101/40  SpO2: 94-98% on RA  Maps: 75-85    Neuro:    alert & oriented x 3 , appropriate mood and affect    Lungs:   clear to auscultation, no wheezes, rales or rhonchi, symmetric air entry     Cardiac:   normal rate, regular rhythm, normal S1, S2, no murmurs, rubs, clicks or gallops, JVD 15cmH2O    Abdomen:    soft, nontender, nondistended, no masses or organomegaly     Extremities:   peripheral pulses normal no pedal edema    Skin:     Warm, dry and intact. Incisions clean and dry with no pus or drainage.    Fick/Cardiac Hemodynamics   VO2 Determination  Height: 182.9 cm (6' 0.01")  Weight FICK (only): 96 kg (211 lb 10.3 oz)  BSA (Fick only) (Calculated - sq m): 2.21 sq meters  VO2: 276.25    CI (L/min/m2): 3.2 L/min/m2    CO/CI Determination  SaO2 : 0.97  SvO2 : 0.69  SaO2 - SvO2: 0.28  HGB Result: 9.2  x 1.36 x 10: 35.03  Cardiac Output (FICK): 7.89  Cardiac Index (FICK): 3.57  Cardiac Power Output: 1.14    PVR Determination  PAP: 297/297  PAP (Mean): 32 mmHg  PAWP (mmHg): 18 mmHg  Pulmonary Artery Pulsatility Index (PAPI): 0    SVR Determination  MAP (mmHg): 72  CVP (mmHg): 18 mmHg  MAP - CVP: 57  SVR (dynes/second/cm3): 644.98 dynes/second/cm3       Most Recent Labs  8.46 \ 8.7* / 85*    / 26.5* \    03/13 0408    134* 97* 79.0* / 132*   4.0 24 4.7* \    03/13 0408            Additional Diagnoses:     Patient has BMI=Body mass index is 27.91 kg/m.  Diagnosis: Overweight based on BMI criteria              Recent Labs     12/30/22  0408 12/29/22  0345 12/28/22  0351   Platelets 85* 78* 69*       Diagnosis: Moderate Thrombocytopenia                 Recent Labs     12/30/22  0408 12/29/22  2135 12/29/22  1643 12/29/22  1022 12/29/22  0345 12/28/22  2214 12/28/22  1638 12/28/22  1007 12/28/22  0351 12/27/22  1706   Sodium 134* 132* 131* 130* 130* 130* 129* 130* 131* 132*        Diagnosis: No additional diagnosis                  Recent Labs   Lab 12/30/22  0408 12/29/22  0345 12/28/22  0351   Hgb 8.7* 9.0* 8.9*   Hematocrit 26.5* 27.3* 26.8*   MCV 100.8* 100.4* 98.2*   WBC 8.46 7.51 6.89   Platelets 85* 78* 69*       Recent Labs   Lab 12/24/22  1513 12/24/22  0912 12/22/22  1430 12/21/22  2247   Ferritin  --   --  57.00  --    Iron  --   --   --  32*   TIBC  --   --   --  307   Iron Saturation  --   --   --  10*   Folate 12.6  --   --   --    Haptoglobin  --  165  --   --        Anemia Diagnosis: No new diagnosis              Vanessa Ralphs., DO  [facility] Internal Medicine, PGY-2

## 2022-12-30 NOTE — OT Progress Note (Signed)
Occupational Therapy Treatment  [name] [name]        Post Acute Care Therapy Recommendations:     Discharge Recommendations:  Home with supervision    DME needs IF patient is discharging home: Shower chair    Therapy discharge recommendations may change with patient status.  Please refer to most recent note for up-to-date recommendations.    Assessment:   Significant Findings: none    Patient agreeable to OT and tolerated session well.  Pt received in chair and demonstrated increased activity tolerance this day.  Pt able to don/doff socks with SBA.  CGA for sit<>stand and during household mobility with RW, pt required less rest breaks this day and reports his legs feel better.  Cued for safe hand placement during sit<>stand and needs met at end of session.  Continued OT services to address ADL and mobility goals would benefit pt.      Treatment Activities: ADLs, functional mobility/transfers, pt training     Educated the patient to role of occupational therapy, plan of care, goals of therapy and safety with mobility and ADLs, energy conservation techniques, pursed lip breathing.    Plan:   OT Frequency Recommended: 3-4x/wk     Goal Formulation: Patient  OT Plan  Risks/Benefits/POC Discussed with Pt/Family: With patient  Treatment Interventions: ADL retraining, Functional transfer training, UE strengthening/ROM, Endurance training, Patient/Family training, Equipment eval/education  Discharge Recommendation: Home with supervision  DME Recommended for Discharge: Shower chair  OT Frequency Recommended: 3-4x/wk  OT - Next Visit Recommendation: 12/31/22    Continue plan of care.      Unit: [place] [facility]  Bed: FI103/FI103-01       Precautions and Contraindications:   Precautions  Other Precautions: falls risk    Updated Medical Status/Imaging/Labs:  No results found.     Subjective: "I like basketball."   Patient's medical condition is appropriate for Occupational Therapy intervention at  this time.  Patient is agreeable to participation in the therapy session. Nursing clears patient for therapy.    Pain Assessment  Pain Assessment: No/denies pain    Objective:   Observation of Patient/Vital Signs:  Patient is seated in a bedside chair with telemetry, ICU lines/leads, radial A-line, peripheral IV, external catheter in place.  Pt wore mask during therapy session:No      Cognition/Neuro Status  Arousal/Alertness: Appropriate responses to stimuli  Attention Span: Appears intact  Orientation Level: Oriented X4  Memory: Appears intact  Following Commands: Follows all commands and directions without difficulty  Safety Awareness: independent  Insights: Fully aware of deficits  Problem Solving: Able to problem solve independently  Behavior: attentive;calm;cooperative  Motor Planning: intact  Coordination: intact    Functional Mobility  Sit to Stand Transfers: Contact Guard Assist  Stand to Sit Transfers: Contact Guard Assist  Functional Mobility/Ambulation: Nurse, learning disability (with RW)    Self-care and Home Management  UB Dressing: Minimal Assist;sitting (donning/doffing gown)  LB Dressing: Stand by Assist;sitting;Don/doff R sock;Don/doff L sock    Balance  Static Sitting: SBA  Dynamic Sitting: SBA  Static Standing: CGA for sit<>stand  Dynamic Standing: CGA with RW     PMP Activity: Step 6 - Walks in Room               Participation: good  Endurance: fair      Patient left with call bell within reach, all needs met, SCDs off as found, fall mat in place, bed alarm in place and all questions  answered. RN notified of session outcome and patient response.       Goals:    Time For Goal Achievement: 5 visits  ADL Goals  Patient will groom self: Supervision, 5 visits  Patient will dress upper body: Supervision, 5 visits  Patient will dress lower body: Supervision, 5 visits  Patient will toilet: Supervision, 5 visits  Mobility and Transfer Goals  Pt will perform functional transfers: Supervision, 5 visits                            PPE worn during session: procedural mask and gloves    Tech present: n/a  PPE worn by tech: N/A      [name], OTR/L  Pager [phone]     Time of Treatment:   OT Received On: 12/30/22  Start Time: [id number]  Stop Time: 0840  Time Calculation (min): 25 min    Treatment # 3 of 5 visits

## 2022-12-30 NOTE — PT Progress Note (Signed)
Physical Therapy Treatment  Fernanda Drum  Post Acute Care Therapy Recommendations:     Discharge Recommendations:  Home with supervision    DME needs IF patient is discharging home: Front wheel walker    Therapy discharge recommendations may change with patient status.  Please refer to most recent note for up-to-date recommendations.      Assessment:   Significant Findings: none    Pt presents with decreased strength, decreased activity tolerance, impaired balance and gait deficits. Pt is cga for sit to stand transfer. Pt is able to ambulate multiple short distances with rolling walker, cga, verbal cues to maintain rolling walker close to body. Pt would benefit from continued acute skilled physical therapy services to address these deficits and maximize independence with functional mobility. Recommend D/C to home with supervision.      Assessment: Decreased UE strength, Decreased LE strength, Decreased safety/judgement during functional mobility, Decreased endurance/activity tolerance, Impaired coordination, Decreased functional mobility, Decreased balance, Gait impairment  Progress: Progressing toward goals  Prognosis: Good, With continued PT status post acute discharge  Risks/Benefits/POC Discussed with Pt/Family: With patient  Patient left without needs and call bell within reach. RN notified of session outcome.     Treatment Activities: bed mobility, transfer training, gait training, therapeutic exercise, pt education and discharge planning     Educated the patient to role of physical therapy, plan of care, goals of therapy and HEP, safety with mobility and ADLs, energy conservation techniques, discharge instructions.    Plan:   Treatment/Interventions: Exercise, Gait training, Neuromuscular re-education, Functional transfer training, LE strengthening/ROM, Endurance training, Patient/family training, Equipment eval/education, Bed mobility        PT Frequency: 2-3x/wk     Continue plan of care.    Unit:  [place] CICU  Bed: FI103/FI103-01     Precautions and Contraindications:   Precautions  Other Precautions: falls risk    Updated Medical Status/Imaging/Labs: Reviewed Chart     Subjective: Pt states, "Mr. Green visited me."   Patient Goal: None stated    Pain Assessment  Pain Assessment: No/denies pain     Patient's medical condition is appropriate for Physical Therapy intervention at this time.  Patient is agreeable to participation in the therapy session. Nursing clears patient for therapy.    Objective:   Observation of Patient/Vital Signs:  Patient is seated in a bedside chair with telemetry, radial arterial line, peripheral IV, and male external catheter in place.    Cognition/Neuro Status  Arousal/Alertness: Appropriate responses to stimuli  Attention Span: Appears intact  Orientation Level: Oriented X4  Memory: Appears intact  Following Commands: Follows all commands and directions without difficulty  Safety Awareness: independent  Insights: Fully aware of deficits  Motor Planning: intact  Coordination: intact         Functional Mobility:  Sit to Stand: Contact Guard Assist  Stand to Sit: Contact Guard Assist       Ambulation:  PMP - Progressive Mobility Protocol   PMP Activity: Step 7 - Walks out of Room  Distance Walked (ft) (Step 6,7): 300 Feet (100 ft, 3 times, seated rest breaks between attempts)     Ambulation: Contact Guard Assist;with front-wheeled walker  Pattern: decreased cadence;decreased step length      Patient Participation: Good  Patient Endurance: Fair     Patient left with call bell within reach, all needs met, SCDs not in place, fall mat in place, bed alarm on, chair alarm n/a and all questions answered.  RN notified of session outcome and patient response.     Goals:  Goals  Goal Formulation: With patient  Time for Goal Acheivement: 5 visits  Goals: Select goal  Pt Will Go Supine To Sit: with supervision, to maximize functional mobility and independence  Pt Will  Perform Sit To Supine: with supervision, to maximize functional mobility and independence  Pt Will Perform Sit to Stand: with supervision, to maximize functional mobility and independence  Pt Will Ambulate: > 200 feet, with rolling walker, with supervision, to maximize functional mobility and independence (w/ LRAD)  Pt Will Go Up / Down Stairs: 1-2 stairs, with supervision, With rail, to maximize functional mobility and independence    PPE worn during session: gloves  Pt wore mask during therapy session:No    Tech present: No  PPE worn by tech: N/A    Time of Treatment  PT Received On: 12/30/22  Start Time: 0845  Stop Time: 0910  Time Calculation (min): 25 min    Treatment # 2 out of 5 visits    Hal Hope, DPT, pager [phone]

## 2022-12-30 NOTE — Progress Notes (Signed)
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 Disease had been controlled on treatment. Peak ALC before starting treatment was 40.     #Thrombocytopenia. Chronic; baseline in the low 100s; currently 85k. Okay to monitor.     #heart failure; cardiogenic shock. As per primary team and cardiology.    Leighton Ruff, MD, MD  [facility]  [phone]    Subjective:   The patient is a 77 yo male who follows with Dr. Mervin Kung of [facility] for CLL who was started on acalabrutinib in July 2023 given worsening adenopathy and WBC.    Admitted now due to heart failure.    Physical Exam     Vitals:    12/30/22 1400   BP: 116/58   Pulse: 84   Resp:    Temp:    SpO2: 95%       General: Alert and oriented, no acute distress  Head and neck: Oropharynx clear, eyes normal, no sinus congestion  Respiratory: Lungs clear to auscultation, normal work of breathing, no coughs  Cardiovascular: Regular rate and rhythm, no murmurs or gallops  Abdomen: Soft, nontender, nondistended, normal bowel sounds  Extremities: Well perfused, no edema  Neuro: No focal deficits, strength equal bilaterally, no confusion  Psych: Appropriate mood and affect    Medications     Current Facility-Administered Medications   Medication Dose Route Frequency    amiodarone  200 mg Oral Daily    aspirin EC  81 mg Oral Daily    atorvastatin  40 mg Oral QHS    heparin (porcine)  5,000 Units Subcutaneous Q8H [place]    hydrALAZINE  100 mg Oral Q8H [place]    insulin glargine  33 Units Subcutaneous Q12H    Or    insulin glargine  15 Units Subcutaneous Q12H    insulin lispro  1-6 Units Subcutaneous QHS    insulin lispro  10 Units Subcutaneous TID AC    insulin lispro  2-10 Units Subcutaneous TID AC    isosorbide dinitrate  20 mg Oral TID - Nitrate Free Interval    niacin  500 mg Oral BID    polyethylene glycol  17 g Oral Daily    senna-docusate  1  tablet Oral Q12H [facility]         Labs:     Results       Procedure Component Value Units Date/Time    Magnesium [id number] Collected: 12/30/22 1131    Specimen: Blood Updated: 12/30/22 1322     Magnesium 2.2 mg/dL     Basic Metabolic Panel [id number]  (Abnormal) Collected: 12/30/22 1131    Specimen: Blood Updated: 12/30/22 1201     Glucose 129 mg/dL      BUN 80.0 mg/dL      Creatinine 4.6 mg/dL      Calcium 9.1 mg/dL      Sodium 135 mEq/L      Potassium 4.0 mEq/L      Chloride 97 mEq/L      CO2 24 mEq/L      Anion Gap 14.0     eGFR 12.5 mL/min/1.73 m2     Glucose Whole Blood - POCT [id number]  (Abnormal) Collected: 12/30/22 1118     Updated: 12/30/22 1126     Whole Blood Glucose POCT 135 mg/dL     Lactic Acid [id number] Collected:  12/30/22 1053    Specimen: Blood Updated: 12/30/22 1120     Lactic Acid 1.1 mmol/L     Narrative:      To be drawn with each measure of co-oximetry.  [place] [id number] at 12/30/2022 10:17 Reason: Patient unavailable    Co-Oximetry Profile [id number]  (Abnormal) Collected: 12/30/22 1053     Updated: 12/30/22 1119     Hemoglobin Total 9.2 g/dL      Carboxyhemoglobin 2.6 %      Oxygenated Hemoglobin 68.6 %      Methemoglobin 0.6 %      O2 Content 8.9 Vol %      Hematocrit Total Calculated 28.4 %     Narrative:      To be drawn with each measure of co-oximetry.  Rescheduled by [id number] at 12/30/2022 10:17 Reason: Patient unavailable    Glucose Whole Blood - POCT [id number]  (Abnormal) Collected: 12/30/22 0721     Updated: 12/30/22 0725     Whole Blood Glucose POCT 114 mg/dL     Manual Differential [id number]  (Abnormal) Collected: 12/30/22 0408     Updated: 12/30/22 0704     Segmented Neutrophils 54 %      Band Neutrophils 0 %      Lymphocytes Manual 43 %      Monocytes Manual 2 %      Eosinophils Manual 0 %      Basophils Manual 0 %      Atypical Lymphocytes % 1 %      Neutrophils Absolute Manual 4.57 x10 3/uL      Band Neutrophils Absolute 0.00 x10 3/uL      Lymphocytes Absolute Manual  3.64 x10 3/uL      Monocytes Absolute 0.17 x10 3/uL      Eosinophils Absolute Manual 0.00 x10 3/uL      Basophils Absolute Manual 0.00 x10 3/uL      Atypical Lymphocytes Absolute 0.08 x10 3/uL     Cell MorpHology [id number]  (Abnormal) Collected: 12/30/22 0408     Updated: 12/30/22 0704     Cell Morphology Normal     Platelet Estimate Decreased     Giant Platelets Present    CBC and differential [id number]  (Abnormal) Collected: 12/30/22 0408    Specimen: Blood Updated: 12/30/22 0704     WBC 8.46 x10 3/uL      Hgb 8.7 g/dL      Hematocrit 26.5 %      Platelets 85 x10 3/uL      RBC 2.63 x10 6/uL      MCV 100.8 fL      MCH 33.1 pg      MCHC 32.8 g/dL      RDW 17 %      MPV 11.7 fL      Instrument Absolute Neutrophil Count 4.03 x10 3/uL      Nucleated RBC 0.2 /100 WBC      Absolute NRBC 0.02 x10 3/uL     Basic Metabolic Panel [id number]  (Abnormal) Collected: 12/30/22 0408    Specimen: Blood Updated: 12/30/22 0529     Glucose 132 mg/dL      BUN 79.0 mg/dL      Creatinine 4.7 mg/dL      Calcium 8.8 mg/dL      Sodium 134 mEq/L      Potassium 4.0 mEq/L      Chloride 97 mEq/L      CO2 24 mEq/L      Anion Gap  13.0     eGFR 12.2 mL/min/1.73 m2     Magnesium [id number] Collected: 12/30/22 0408    Specimen: Blood Updated: 12/30/22 0529     Magnesium 2.4 mg/dL     Hepatic function panel (LFT) [id number]  (Abnormal) Collected: 12/30/22 0408    Specimen: Blood Updated: 12/30/22 0529     Bilirubin, Total 1.5 mg/dL      Bilirubin Direct 0.8 mg/dL      Bilirubin Indirect 0.7 mg/dL      AST (SGOT) 26 U/L      ALT 62 U/L      Alkaline Phosphatase 64 U/L      Protein, Total 6.1 g/dL      Albumin 3.1 g/dL      Globulin 3.0 g/dL      Albumin/Globulin Ratio 1.0    Lactic Acid [id number] Collected: 12/30/22 0408    Specimen: Blood Updated: 12/30/22 0432     Lactic Acid 0.7 mmol/L     Narrative:      To be drawn with each measure of co-oximetry.    Co-Oximetry Profile [id number]  (Abnormal) Collected: 12/30/22 0408     Updated:  12/30/22 0431     Hemoglobin Total 9.7 g/dL      Carboxyhemoglobin 2.3 %      Oxygenated Hemoglobin 61.5 %      Methemoglobin 1.2 %      O2 Content 8.4 Vol %      Hematocrit Total Calculated 30.1 %     Narrative:      To be drawn with each measure of co-oximetry.    Basic Metabolic Panel [id number]  (Abnormal) Collected: 12/29/22 2135    Specimen: Blood Updated: 12/29/22 2225     Glucose 193 mg/dL      BUN 75.0 mg/dL      Creatinine 4.8 mg/dL      Calcium 8.8 mg/dL      Sodium 132 mEq/L      Potassium 4.2 mEq/L      Chloride 94 mEq/L      CO2 22 mEq/L      Anion Gap 16.0     eGFR 11.9 mL/min/1.73 m2     Narrative:      Rescheduled by [id number] at 12/29/2022 21:32 Reason: Printed by   mistake/Printing Issues.  Rescheduled by [id number] at 12/29/2022 21:33 Reason: Printed by   mistake/Printing Issues.    Magnesium [id number] Collected: 12/29/22 2135    Specimen: Blood Updated: 12/29/22 2225     Magnesium 2.4 mg/dL     Narrative:      Rescheduled by [id number] at 12/29/2022 21:32 Reason: Printed by   mistake/Printing Issues.  Rescheduled by [id number] at 12/29/2022 21:33 Reason: Printed by   mistake/Printing Issues.    Lactic Acid [id number] Collected: 12/29/22 2135    Specimen: Blood Updated: 12/29/22 2153     Lactic Acid 1.1 mmol/L     Narrative:      To be drawn with each measure of co-oximetry.  Rescheduled by [id number] at 12/29/2022 21:32 Reason: Printed by   mistake/Printing Issues.  Rescheduled by [id number] at 12/29/2022 21:33 Reason: Printed by   mistake/Printing Issues.    Co-Oximetry Profile [id number]  (Abnormal) Collected: 12/29/22 2135     Updated: 12/29/22 2153     Hemoglobin Total 9.1 g/dL      Carboxyhemoglobin 1.7 %      Oxygenated Hemoglobin 70.4 %      Methemoglobin 1.4 %      O2 Content 9.1 Vol %  Hematocrit Total Calculated 28.3 %     Narrative:      To be drawn with each measure of co-oximetry.  Rescheduled by [id number] at 12/29/2022 21:32 Reason: Printed by   mistake/Printing Issues.  Rescheduled by [id number] at 12/29/2022  21:33 Reason: Printed by   mistake/Printing Issues.    Glucose Whole Blood - POCT [id number]  (Abnormal) Collected: 12/29/22 2134     Updated: 12/29/22 2136     Whole Blood Glucose POCT 189 mg/dL     Magnesium [id number] Collected: 12/29/22 1643    Specimen: Blood Updated: 12/29/22 1956     Magnesium 2.4 mg/dL     Basic Metabolic Panel [id number]  (Abnormal) Collected: 12/29/22 1643    Specimen: Blood Updated: 12/29/22 1956     Glucose 242 mg/dL      BUN 69.0 mg/dL      Creatinine 4.8 mg/dL      Calcium 8.7 mg/dL      Sodium 131 mEq/L      Potassium 4.3 mEq/L      Chloride 93 mEq/L      CO2 22 mEq/L      Anion Gap 16.0     eGFR 11.9 mL/min/1.73 m2     Glucose Whole Blood - POCT [id number]  (Abnormal) Collected: 12/29/22 1943     Updated: 12/29/22 1946     Whole Blood Glucose POCT 191 mg/dL     Lactic Acid [id number] Collected: 12/29/22 1643    Specimen: Blood Updated: 12/29/22 1703     Lactic Acid 1.1 mmol/L     Narrative:      To be drawn with each measure of co-oximetry.    Co-Oximetry Profile [id number]  (Abnormal) Collected: 12/29/22 1643     Updated: 12/29/22 1702     Hemoglobin Total 9.3 g/dL      Carboxyhemoglobin 2.6 %      Oxygenated Hemoglobin 62.7 %      Methemoglobin 0.9 %      O2 Content 8.2 Vol %      Hematocrit Total Calculated 28.9 %     Narrative:      To be drawn with each measure of co-oximetry.    Glucose Whole Blood - POCT [id number]  (Abnormal) Collected: 12/29/22 1656     Updated: 12/29/22 1658     Whole Blood Glucose POCT 246 mg/dL     URINE IMMUNOFIXATION [id number] Collected: 12/28/22 1007     Updated: 12/29/22 1541     IFE Urine Interp See IFE Review              Imaging   XR Chest AP Portable    Result Date: 12/28/2022  Support hardware as above. No new acute findings. Montine Circle, MD 12/28/2022 7:36 AM    XR Chest AP Portable    Result Date: 12/27/2022   Stable positioning of right IJ PA catheter projecting over the right lower lobar pulmonary artery. Blanchard Mane 12/27/2022 11:45  PM    Tunneled Cath Placement (Permcath)    Result Date: 12/25/2022  Right internal jugular vein approach 6 French single lumen Hohn catheter tip in the superior cavoatrial junction. PLAN: Catheter is ready for use. Erik Obey, MD 12/25/2022 3:22 PM    X-ray chest AP portable    Result Date: 12/25/2022  No change mild edema [name], MD 12/25/2022 8:36 AM    X-ray chest AP portable    Result Date: 12/24/2022  Stable exam. No new acute intrathoracic process. Montine Circle, MD 12/24/2022 11:44  AM    X-ray chest AP portable    Result Date: 12/23/2022  Improved inspiratory effort with mild dependent atelectasis [name], MD 12/23/2022 8:16 AM    XR Chest AP Portable    Result Date: 12/23/2022   Airspace disease in the medial right lower lobe. Blanchard Mane 12/23/2022 12:46 AM    XR Chest AP Portable    Result Date: 12/22/2022  1. Right Swan-Ganz catheter tip in the proximal right lower pulmonary artery. No pneumothorax. 2. Mild vascular congestion, small right pleural effusion and right basilar atelectasis. Ardelia Mems, MD 12/22/2022 4:59 PM    Chest AP Portable    Result Date: 12/20/2022  No acute intrathoracic process. Montine Circle, MD 12/20/2022 9:12 PM

## 2022-12-31 ENCOUNTER — Inpatient Hospital Stay: Payer: Commercial Managed Care - POS

## 2022-12-31 DIAGNOSIS — Z7189 Other specified counseling: Secondary | ICD-10-CM

## 2022-12-31 LAB — WHOLE BLOOD GLUCOSE POCT
Whole Blood Glucose POCT: 110 mg/dL — ABNORMAL HIGH (ref 70–100)
Whole Blood Glucose POCT: 155 mg/dL — ABNORMAL HIGH (ref 70–100)
Whole Blood Glucose POCT: 291 mg/dL — ABNORMAL HIGH (ref 70–100)
Whole Blood Glucose POCT: 226 mg/dL — ABNORMAL HIGH (ref 70–100)

## 2022-12-31 LAB — CBC AND DIFFERENTIAL
Absolute NRBC: 0.02 10*3/uL — ABNORMAL HIGH (ref 0.00–0.00)
Basophils Absolute Automated: 0.01 10*3/uL (ref 0.00–0.08)
Basophils Automated: 0.1 %
Eosinophils Absolute Automated: 0.06 10*3/uL (ref 0.00–0.44)
Eosinophils Automated: 0.6 %
Hematocrit: 27.6 % — ABNORMAL LOW (ref 37.6–49.6)
Hgb: 9.3 g/dL — ABNORMAL LOW (ref 12.5–17.1)
Immature Granulocytes Absolute: 0.05 10*3/uL (ref 0.00–0.07)
Immature Granulocytes: 0.5 %
Instrument Absolute Neutrophil Count: 5.38 10*3/uL (ref 1.10–6.33)
Lymphocytes Absolute Automated: 2.86 10*3/uL (ref 0.42–3.22)
Lymphocytes Automated: 29.9 %
MCH: 33.1 pg (ref 25.1–33.5)
MCHC: 33.7 g/dL (ref 31.5–35.8)
MCV: 98.2 fL — ABNORMAL HIGH (ref 78.0–96.0)
MPV: 11.6 fL (ref 8.9–12.5)
Monocytes Absolute Automated: 1.19 10*3/uL — ABNORMAL HIGH (ref 0.21–0.85)
Monocytes: 12.5 %
Neutrophils Absolute: 5.38 10*3/uL (ref 1.10–6.33)
Neutrophils: 56.4 %
Nucleated RBC: 0.2 /100 WBC — ABNORMAL HIGH (ref 0.0–0.0)
Platelets: 113 10*3/uL — ABNORMAL LOW (ref 142–346)
RBC: 2.81 10*6/uL — ABNORMAL LOW (ref 4.20–5.90)
RDW: 17 % — ABNORMAL HIGH (ref 11–15)
WBC: 9.55 10*3/uL — ABNORMAL HIGH (ref 3.10–9.50)

## 2022-12-31 LAB — BASIC METABOLIC PANEL
Anion Gap: 17 — ABNORMAL HIGH (ref 5.0–15.0)
Anion Gap: 14 (ref 5.0–15.0)
Anion Gap: 15 (ref 5.0–15.0)
BUN: 84 mg/dL — ABNORMAL HIGH (ref 9.0–28.0)
BUN: 80 mg/dL — ABNORMAL HIGH (ref 9.0–28.0)
BUN: 82 mg/dL — ABNORMAL HIGH (ref 9.0–28.0)
CO2: 22 mEq/L (ref 17–29)
CO2: 25 mEq/L (ref 17–29)
CO2: 24 mEq/L (ref 17–29)
Calcium: 9.6 mg/dL (ref 7.9–10.2)
Calcium: 9.7 mg/dL (ref 7.9–10.2)
Calcium: 9.9 mg/dL (ref 7.9–10.2)
Chloride: 95 mEq/L — ABNORMAL LOW (ref 99–111)
Chloride: 94 mEq/L — ABNORMAL LOW (ref 99–111)
Chloride: 93 mEq/L — ABNORMAL LOW (ref 99–111)
Creatinine: 4.2 mg/dL — ABNORMAL HIGH (ref 0.5–1.5)
Creatinine: 4.1 mg/dL — ABNORMAL HIGH (ref 0.5–1.5)
Creatinine: 4.3 mg/dL — ABNORMAL HIGH (ref 0.5–1.5)
Glucose: 105 mg/dL — ABNORMAL HIGH (ref 70–100)
Glucose: 160 mg/dL — ABNORMAL HIGH (ref 70–100)
Glucose: 333 mg/dL — ABNORMAL HIGH (ref 70–100)
Potassium: 3.8 mEq/L (ref 3.5–5.3)
Potassium: 4.3 mEq/L (ref 3.5–5.3)
Potassium: 4.5 mEq/L (ref 3.5–5.3)
Sodium: 134 mEq/L — ABNORMAL LOW (ref 135–145)
Sodium: 133 mEq/L — ABNORMAL LOW (ref 135–145)
Sodium: 132 mEq/L — ABNORMAL LOW (ref 135–145)
eGFR: 13.9 mL/min/{1.73_m2} — AB (ref >=60)
eGFR: 14.3 mL/min/{1.73_m2} — AB (ref >=60)
eGFR: 13.5 mL/min/{1.73_m2} — AB (ref >=60)

## 2022-12-31 LAB — HEPATIC FUNCTION PANEL (LFT)
ALT: 51 U/L (ref 0–55)
AST (SGOT): 23 U/L (ref 5–41)
Albumin/Globulin Ratio: 1.1 (ref 0.9–2.2)
Albumin: 3.4 g/dL — ABNORMAL LOW (ref 3.5–5.0)
Alkaline Phosphatase: 65 U/L (ref 37–117)
Bilirubin Direct: 0.8 mg/dL — ABNORMAL HIGH (ref 0.0–0.5)
Bilirubin Indirect: 0.9 mg/dL (ref 0.2–1.0)
Bilirubin, Total: 1.7 mg/dL — ABNORMAL HIGH (ref 0.2–1.2)
Globulin: 3.2 g/dL (ref 2.0–3.6)
Protein, Total: 6.6 g/dL (ref 6.0–8.3)

## 2022-12-31 LAB — MAGNESIUM
Magnesium: 2.2 mg/dL (ref 1.6–2.6)
Magnesium: 2.2 mg/dL (ref 1.6–2.6)
Magnesium: 2.3 mg/dL (ref 1.6–2.6)

## 2022-12-31 LAB — TYPE AND SCREEN
AB Screen Gel: NEGATIVE
ABO Rh: O POS

## 2022-12-31 MED ORDER — CHLOROTHIAZIDE SODIUM 500 MG IV SOLR
1000.0000 mg | Freq: Two times a day (BID) | INTRAVENOUS | Status: DC
Start: 2022-12-31 — End: 2023-01-02
  Administered 2022-12-31 – 2023-01-02 (×5): 1000 mg via INTRAVENOUS
  Filled 2022-12-31 (×7): qty 1000
  Filled 2022-12-31: qty 500
  Filled 2022-12-31: qty 1000

## 2022-12-31 MED ORDER — ISOSORBIDE DINITRATE 20 MG PO TABS
40.0000 mg | ORAL_TABLET | Freq: Three times a day (TID) | ORAL | Status: DC
Start: 2022-12-31 — End: 2023-01-04
  Administered 2022-12-31 – 2023-01-04 (×12): 40 mg via ORAL
  Filled 2022-12-31 (×13): qty 2

## 2022-12-31 MED ORDER — POTASSIUM CHLORIDE CRYS ER 20 MEQ PO TBCR
20.0000 meq | EXTENDED_RELEASE_TABLET | Freq: Once | ORAL | Status: AC
Start: 2022-12-31 — End: 2022-12-31
  Administered 2022-12-31: 20 meq via ORAL
  Filled 2022-12-31: qty 1

## 2022-12-31 NOTE — Consults (Signed)
[place] Contacts (Mon-Fri 8a-4:30p): [facility]: [phone],  [facility] Pager: [phone]      Palliative Care Consult   Date Time: 12/31/22 3:37 PM   Patient Name: [name] [name]   Location: [id number]   Attending Physician: Sullivan Lone, MD   Primary Care Physician: Clearance Coots, MD   Consulting Provider: Jorene Minors, MD   Consulting Service: Palliative Medicine and Comprehensive Care  Consulted request from Sullivan Lone, MD to see patient regarding:   Reason for Referral: Clarify goals of care; End of life; Advance care planning; Psychosocial or spiritual support; Counsel regarding hospice     Palliative Diagnosis Category: LVAD/Transplant  Patient Type: Return     Assessment & Plan   Impression   [name] [name] 77 y.o. 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 Pallaitive consulted for goals of care in this context. Per my discussing with AHF, prognosis is 6-12 months        Estimated Prognosis: Weeks to months         Recommendations   1. Goals of Care:   Patient has capacity for medical decision making  No AD, default next of kin is his spouse Kaushik Feeney  Patient has a somewhat limited understanding of his medical condition   - Goal is "to have 2-3 more years with my wife"   - Understands that LVAD is not an option and is optimistic about medical management  - His main priority is focus on his spirituality feeling that "I can beat this, with God all things are possible" citing biblical references of healing and raising the dead  - He is focused on how his brother survived on dialysis for 10 years, feeling that he can do the same; without being able to separate his kidney disease (which does not currently require dialysis) from his end stage heart disease  - He is not interested in  home based hospice or palliative care and is interested primarily on cure through prayer. He understands that these things can happen in parallel    - Palliative chaplain to see given significant spiritual component to his processing        Code Status:       - Full Code      Advance Care Planning:  ACP Validation: Advance care planning discussion initialized  Medical Decision Maker: Next of Kin status:   Wells Moring (spouse) [phone]  ACP Document: None      3. Psychosocial: Lives with spouse in [place], otherwise has limited social support    4. Spiritual: [place] available for support    5. PC Team follow-up plans: tomorrow      Discharge Disposition: TBD      Outpatient Follow Up Recommended: Yes    Outcomes: Clarified goals of care, Counseled regarding hospice, Provided psychosocial or spiritual support, and ACP counseling assistance  80 minutes.  Total time today in care of [name] [name]  Discussed with Patient with recommendations above.       Start Time:  1310                 Stop Time:   [place], MD  Palliative Medicine & Comprehensive Care  Service Contacts (Mon-Fri 8a-4:30p): [facility]: [phone],  [place] Pager: [phone]  History of Presenting Illness   [name] [name] is a 77 y.o. male admitted to hospital on 12/21/2022 with Cardiogenic shock [R57.0]  Heart failure [I50.9]        Background:  Marital Status: married to his wife for 54 years  Family: 4 children, living in [place]  Home/Living situation: Lives at home with his wife, functions independently  Occupation/Hobbies: Worked as a highschool/college history Pharmacist, hospital, enjoys watching TV, going to the bookstore and reading  Source of hope/strength: christian faith  How do you and your family make medical decisions? Through prayer    Discussed with AHF, met with patient at bedside. He understands that he was declined for LVAD and is focused on "my brother survived 10 years on dialysis" though he admits that  nobody has told him that he needs dialysis. He acknolwedges that he has end stage heart disease and that there are limited life prolonging options and focused on optimism and makes referenes to the bible in that "through God all things are possible" and that "in the bible people are raised from the dead" and he is hoping to "beat this" and plans to pray 2-3 times daily.     He denies any dyspnea, pain or other symptoms and hopes to get home soon.          Goals of Care   CURRENT CPR Status: Full Code       Advanced Care Planning:      Decisional Capacity: yes      Advance Directives have been completed in the past: no      Advance Directives are available in chart: no      Discussed this admission: yes      ACP note completed: no       Date:      GOC Discussion: Life prologation      Outcome of Discussion: treatment/curative pathway        Palliative Functional and Symptom Assessment      ADLs prior to admission:    Independent Needs assistance Dependent   Ambulation '[x]'$  '[]'$  '[]'$    Transferring '[x]'$  '[]'$  '[]'$    Dressing '[x]'$  '[]'$  '[]'$    Bathing '[x]'$  '[]'$  '[]'$    Toileting '[x]'$  '[]'$  '[]'$    Feeding '[x]'$  '[]'$  '[]'$      Palliative Performance Scale: 70% - Reduced ambulation, unable to do normal work, some evidence of disease, full self-care, normal or reduced intake, full LOC    FAST Score (Dementia Patients): N/A    ECOG (Cancer Patients): N/A    [place] Symptom Assessment Scale (ESAS): Completed  All items 0 = best, 10 = worst  Anxiety: 0  Depression: 0  Drowsiness: 0  Lack of Appetite: 0  Nausea: 0  Pain: 0  Shortness of Breath: 0  Tiredness: 0  Well-being: 3     Review of Systems     '[x]'$  As per HPI, above ESAS and physical exam, otherwise all systems negative    Pain: none  Pain Location and Description: denies          Past Medical, Surgical and Family History   Past Medical History:   Diagnosis Date    Acute CHF     NOV  & DEC 2018, - DIALYSIS CATH PLACED Aug 24 2017 TO REMOVE FLUID     Acute systolic (congestive) heart failure 08/2017     Anxiety     CAD (coronary artery disease)     Cardiomyopathy     nonischemic  CHF (congestive heart failure) 08/12/2014, 2013    Chronic obstructive pulmonary disease     POSSIBLE PER PT HE USES  SYNBICORT BID ABD  BREO INHALER PRN    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    Coronary artery disease 2011    CORONARY STENT PLACEMENT FOLLOWED BY [facility]    Depression     echocardiogram 02/2016, 11/2016, 09/2017, 12/20/2017    GERD (gastroesophageal reflux disease)     Heart attack 2011    and 2014    Hyperlipemia     Hypertension     ICD (implantable 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     PLACED 2014  [facility]  LAST INTERROGATION April 01 2018 REPORT REQUESTED    Ischemic cardiomyopathy     EF 15% ON ECHO 09-20-2017 DR GARG IN EPIC    Nonischemic cardiomyopathy     NSTEMI (non-ST elevated myocardial infarction)     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    Pacemaker 2014    MEDTRONIC ICD/PACEMAKER COMBO LAST INTERROGATION  12-12 2018    Pneumonia 09/2016    Primary cardiomyopathy     Ischemic cardiomyopathy EF 15% ON ECHO 09-20-2017 DR GARG IN EPIC    Sepsis 08/2017    Syncope and collapse     PRIOR TO ICD/PACEMAKER PLACED 2014    Type 2 diabetes mellitus, controlled DX 1998    BS  AVG 100  A1C 7.4 Dec 27 2017    [place]     PT SEE HIS PMD April 01 2018 RE THIS-TOLD TO SEE PMD BY [facility] JUNE 12 WHEN THEY SAW HIM FOR A CL      Past Surgical History:   Procedure Laterality Date    BIV  11/10/2016    ICD METRONIC  UPGRADE     CARDIAC CATHETERIZATION  03/2010    CORONARY STENT PLACEMENT X 2 PER PT; LM normal, LAD 70-80% distal lesion at apex, 20% lesion mid CFX    CARDIAC CATHETERIZATION  12/2012    CARDIAC DEFIBRILLATOR PLACEMENT  12/2012    [facility]    CARDIAC PACEMAKER PLACEMENT  2014    MEDTRONIC ICD/PACEMAKER PLACED [facility]    CIRCUMCISION  AGE 34    COLONOSCOPY  2009    CORRECTION HAMMER TOE      duodenal ulcer  1973    STRESS RELATED IN SCHOOL    ECHOCARDIOGRAM, TRANSTHORACIC  02/2016 11/2016 09/2017 12/2017    ECHOCARDIOGRAM,  TRANSTHORACIC  02/2016,11/2016,12/20/2017    EGD N/A 08/15/2014    Procedure: EGD;  Surgeon: Loraine Maple, MD;  Location: [facility];  Service: Gastroenterology;  Laterality: N/A;  egd w/ bx    EGD  1975    EGD, COLONOSCOPY N/A 04/04/2018    Procedure: EGD with bxs, COLONOSCOPY with polypectomy and clipping;  Surgeon: Omer Jack, MD;  Location: [facility];  Service: Gastroenterology;  Laterality: N/A;    FRACTURE SURGERY  AGE 31     RT  ANKLE- CAST APPLIED    HERNIA REPAIR  AGE 34    LEFT INGUINAL HERNIA    MPI nuclear study  06/2016    orthopedic surgery  AGE 59    RT foot corrective - [facility]AMMERTOE    RIGHT HEART CATH Right 09/09/2022    Procedure: Right Heart Cath;  Surgeon: Wandra Arthurs, MD;  [facility];  Service: Cardiovascular;  Laterality: Right;  tx from [place] INCL. CO AND SATS Right 12/22/2022  Procedure: Right Heart Cath ONLY incl. CO and Sats;  Surgeon: Day, Lillette Boxer, MD;  Location: [facility];  Service: Cardiovascular;  Laterality: Right;  Right Heart Cath with leave in PA cath for milrinone initiation    [place]    TUNNELED CATH PLACEMENT (PERMCATH) N/A 12/25/2022    Procedure: [facility];  Surgeon: Estelle Grumbles, MD;  Location: [facility];  Service: Interventional Radiology;  Laterality: N/A;      Family History   Problem Relation Age of Onset    Breast cancer Mother     Heart attack Mother 19    Hypertension Mother     Diabetes Mother     Coronary artery disease Mother     Diabetes Brother     Heart attack Father     Hypertension Father        Social History   Substance Use:    reports that he has never smoked. He has never used smokeless tobacco.    reports that he does not currently use alcohol.    reports no history of drug use.     Cultural Concerns:    Translator needed: '[x]'$  NO   '[]'$  YES                                         Spirituality and Importance: Baptist      Medications   Scheduled  Meds  Current Facility-Administered Medications   Medication Dose Route Frequency    amiodarone  200 mg Oral Daily    aspirin EC  81 mg Oral Daily    atorvastatin  40 mg Oral QHS    chlorothiazide  1,000 mg Intravenous BID    heparin (porcine)  5,000 Units Subcutaneous Q8H [place]    hydrALAZINE  100 mg Oral Q8H [place]    insulin glargine  33 Units Subcutaneous Q12H    Or    insulin glargine  15 Units Subcutaneous Q12H    insulin lispro  1-6 Units Subcutaneous QHS    insulin lispro  10 Units Subcutaneous TID AC    insulin lispro  2-10 Units Subcutaneous TID AC    isosorbide dinitrate  40 mg Oral TID - Nitrate Free Interval    niacin  500 mg Oral BID    polyethylene glycol  17 g Oral Daily    senna-docusate  1 tablet Oral Q12H [facility]      DRIPS   bumetanide 3 mg/hr (12/31/22 1132)      PRN MEDS  Current Facility-Administered Medications   Medication Dose    acetaminophen  650 mg    dextrose  15 g of glucose    Or    dextrose  12.5 g    Or    dextrose  12.5 g    Or    glucagon (rDNA)  1 mg    melatonin  6 mg    phenol  1 spray    simethicone  80 mg       Allergies   Allergies   Allergen Reactions    Allopurinol        Physical Exam   BP 105/59   Pulse 77   Temp 97.8 F (36.6 C) (Oral)   Resp (!) 77   Ht 1.829 m (6' 0.01")   Wt 86.2 kg (190 lb 0.6 oz)   SpO2 97%  BMI 25.77 kg/m    Physical Exam:  General: well developed man sitting up in chair in NAD   HEENT:  EOMI, anicteric, MMM  Neck: supple   CV: RRR, no murmur appreciated  Lungs:unlabored, CTAB, no wheeze/crackles   Abd: soft, NT, ND, NABS, no rebound or guarding  Ext: no edema  Neuro: awake, alert, oriented x 3, no focal deficits  Psych:  appropriate insight and judgement, mood and affect congruent with current condition  Skin: no rashes or lesions noted     Labs / Radiology   Lab and diagnostics: reviewed in Epic  Recent Labs   Lab 12/31/22  0357   WBC 9.55*   Hgb 9.3*   Hematocrit 27.6*   Platelets 113*              Recent Labs   Lab 12/31/22  1023   Sodium  133*   Potassium 4.3   Chloride 94*   CO2 25   BUN 80.0*   Creatinine 4.1*   eGFR 14.3*   Glucose 160*   Calcium 9.7     Recent Labs   Lab 12/31/22  0357   Bilirubin, Total 1.7*   Bilirubin Direct 0.8*   Protein, Total 6.6   Albumin 3.4*   ALT 51   AST (SGOT) 23          XR Chest AP Portable    Result Date: 12/31/2022  Cardiomegaly. No pulmonary arterial catheter identified. Montine Circle, MD 12/31/2022 9:47 AM    XR Chest AP Portable    Result Date: 12/30/2022  Pulmonary arterial catheter with tip projecting over the right hilum. Montine Circle, MD 12/30/2022 6:38 PM    XR Chest AP Portable    Result Date: 12/28/2022  Support hardware as above. No new acute findings. Montine Circle, MD 12/28/2022 7:36 AM    XR Chest AP Portable    Result Date: 12/27/2022   Stable positioning of right IJ PA catheter projecting over the right lower lobar pulmonary artery. Blanchard Mane 12/27/2022 11:45 PM    Tunneled Cath Placement (Permcath)    Result Date: 12/25/2022  Right internal jugular vein approach 6 French single lumen Hohn catheter tip in the superior cavoatrial junction. PLAN: Catheter is ready for use. Erik Obey, MD 12/25/2022 3:22 PM    X-ray chest AP portable    Result Date: 12/25/2022  No change mild edema [name], MD 12/25/2022 8:36 AM

## 2022-12-31 NOTE — Committee Review (Signed)
Patient Discussion Note      Being evaluated for: DT LVAD    Transplant Coordinator: No matching coordinators    Additional Discussion Notes and Findings: Patient's candidacy for DT LVAD discussed at committee's meeting on 12/30/2022. 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     Above information has been discussed with patient/patient's family member while in-house.

## 2022-12-31 NOTE — Progress Notes (Signed)
[facility].  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, known CLL    - cardiogenic shock, acute CHF EF 15, severe TR, improving    - Dm2  - anemia macrocytic and thrombocytopenia  - hyponatremia, hypervolemic    Recommendations/Plan:  Cr stable in low 4s amidst large diuresis  - No acute need for HD/RRT   - continues on bumex drip 3/hr with adjustments by cardiology based on hemodynamic parameters  - trend BMP, strict ins and outs  - continues on hydralazine and isordil          Hassie Marbin, MD  Epic Chat Direct (preferred) or  Epic Group: [facility]  [facility]: [phone],   Office: [phone],     ++++++++++++++++++++++++++++++++++++++++++++++++++++++++++++++    Subjective:  Remains in CICU  UOP 5.9 L    On bumex drip     Medications:  Scheduled Meds:  Current Facility-Administered Medications   Medication Dose Route Frequency    amiodarone  200 mg Oral Daily    aspirin EC  81 mg Oral Daily    atorvastatin  40 mg Oral QHS    heparin (porcine)  5,000 Units Subcutaneous Q8H [place]    hydrALAZINE  100 mg Oral Q8H [place]    insulin glargine  33 Units Subcutaneous Q12H    Or    insulin glargine  15 Units Subcutaneous Q12H    insulin lispro  1-6 Units Subcutaneous QHS    insulin lispro  10 Units Subcutaneous TID AC    insulin lispro  2-10 Units Subcutaneous TID AC    isosorbide dinitrate  20 mg Oral TID - Nitrate Free Interval    niacin  500 mg Oral BID    polyethylene glycol  17 g Oral Daily    senna-docusate  1 tablet Oral Q12H [place]     Continuous Infusions:   bumetanide 3 mg/hr (12/31/22 0700)     PRN Meds:acetaminophen, dextrose **OR** dextrose **OR** dextrose **OR** glucagon (rDNA), melatonin, phenol, simethicone    Objective:  Vital signs in last 24 hours:  Temp:  [97.7 F (36.5 C)-98.3 F (36.8 C)] 97.7 F (36.5 C)  Heart  Rate:  [77-90] 82  Resp Rate:  [22-81] 63  BP: (101-142)/(52-63) 133/63  Arterial Line BP: (101-139)/(40-60) 129/56  Intake/Output last 24 hours:    Intake/Output Summary (Last 24 hours) at 12/31/2022 0809  Last data filed at 12/31/2022 0700  Gross per 24 hour   Intake 753.03 ml   Output 5675 ml   Net -4921.97 ml     Intake/Output this shift:  No intake/output data recorded.    Physical Exam:   Gen: No acute distress   HEENT: normocephalic   CV: S1 S2 No rub   Chest: clear anteriorly, decrease toward bases bilaterally   Ab: Nondistended,  soft    Ext: + leg edema    Labs:  Recent Labs   Lab 12/31/22  0357 12/30/22  2148 12/30/22  1621 12/30/22  1131 12/30/22  0408 12/29/22  1022 12/29/22  0345 12/25/22  1714 12/25/22  0328   Glucose 105* 128* 179*  More results in Results Review 132*  More results in Results Review 84  More results in Results Review 283*   BUN 84.0* 79.0*  79.0*  More results in Results Review 79.0*  More results in Results Review 65.0*  More results in Results Review 42.0*   Creatinine 4.2* 4.3* 4.5*  More results in Results Review 4.7*  More results in Results Review 4.7*  More results in Results Review 2.7*   Calcium 9.6 9.3 9.1  More results in Results Review 8.8  More results in Results Review 8.6  More results in Results Review 9.0   Sodium 134* 134* 133*  More results in Results Review 134*  More results in Results Review 130*  More results in Results Review 134*   Potassium 3.8 3.9 4.1  More results in Results Review 4.0  More results in Results Review 3.9  More results in Results Review 3.6   Chloride 95* 95* 95*  More results in Results Review 97*  More results in Results Review 94*  More results in Results Review 98*   CO2 '22 25 25  '$ More results in Results Review 24  More results in Results Review 23  More results in Results Review 24   Albumin 3.4*  --   --   --  3.1*  --  3.1*  More results in Results Review 3.0*   Phosphorus  --   --   --   --   --   --   --   --  2.9   Magnesium 2.2 2.3  2.3  More results in Results Review 2.4  More results in Results Review 2.2  More results in Results Review  --    More results in Results Review = values in this interval not displayed.     Recent Labs   Lab 12/31/22  0357 12/30/22  0408 12/29/22  0345   WBC 9.55* 8.46 7.51   Hgb 9.3* 8.7* 9.0*   Hematocrit 27.6* 26.5* 27.3*   MCV 98.2* 100.8* 100.4*   MCH 33.1 33.1 33.1   MCHC 33.7 32.8 33.0   RDW 17* 17* 17*   MPV 11.6 11.7 11.6   Platelets 113* 85* 78*

## 2022-12-31 NOTE — PT Progress Note (Signed)
Physical Therapy Treatment  [name] [name]  Post Acute Care Therapy Recommendations:     Discharge Recommendations:  Home with supervision    DME needs IF patient is discharging home: Front wheel walker    Therapy discharge recommendations may change with patient status.  Please refer to most recent note for up-to-date recommendations.      Assessment:   Significant Findings: none    Pt presents with decreased strength, decreased activity tolerance, impaired balance and gait deficits. Pt is cga for sit to stand transfer. Pt is able to ambulate multiple short distances with rolling walker, cga, verbal cues to maintain rolling walker close to body. Pt would benefit from continued acute skilled physical therapy services to address these deficits and maximize independence with functional mobility. Recommend D/C to home with supervision.      Assessment: Decreased UE strength, Decreased LE strength, Decreased safety/judgement during functional mobility, Decreased endurance/activity tolerance, Impaired coordination, Decreased functional mobility, Decreased balance, Gait impairment  Progress: Improving as expected  Prognosis: Good, With continued PT status post acute discharge  Risks/Benefits/POC Discussed with Pt/Family: With patient  Patient left without needs and call bell within reach. RN notified of session outcome.     Treatment Activities: bed mobility, transfer training, gait training, therapeutic exercise, pt education and discharge planning     Educated the patient to role of physical therapy, plan of care, goals of therapy and HEP, safety with mobility and ADLs, energy conservation techniques, discharge instructions.    Plan:   Treatment/Interventions: Exercise, Gait training, Neuromuscular re-education, Functional transfer training, LE strengthening/ROM, Endurance training, Patient/family training, Equipment eval/education, Bed mobility        PT Frequency: 2-3x/wk     Continue plan of care.    Unit:  [place] [facility]  Bed: FI103/FI103-01     Precautions and Contraindications:   Precautions  Other Precautions: falls risk    Updated Medical Status/Imaging/Labs: Reviewed Chart     Subjective: Pt states, "I want to pray."   Patient Goal: None stated    Pain Assessment  Pain Assessment: No/denies pain     Patient's medical condition is appropriate for Physical Therapy intervention at this time.  Patient is agreeable to participation in the therapy session. Nursing clears patient for therapy.    Objective:   Observation of Patient/Vital Signs:  Patient is seated in a bedside chair with telemetry, radial arterial line, peripheral IV, and male external catheter in place.    Cognition/Neuro Status  Arousal/Alertness: Appropriate responses to stimuli  Attention Span: Appears intact  Orientation Level: Oriented X4  Memory: Appears intact  Following Commands: Follows all commands and directions without difficulty  Safety Awareness: independent  Insights: Fully aware of deficits  Problem Solving: Able to problem solve independently  Motor Planning: intact  Coordination: intact         Functional Mobility:  Sit to Stand: Stand by Assist  Stand to Sit: Stand by Assist       Ambulation:  PMP - Progressive Mobility Protocol   PMP Activity: Step 7 - Walks out of Room  Distance Walked (ft) (Step 6,7): 300 Feet (150 ft, 2 times, seated rest breaks between attempts)     Ambulation: Stand by Assist;with front-wheeled walker  Pattern: decreased cadence;Step to      Patient Participation: Good  Patient Endurance: Fair     Patient left with call bell within reach, all needs met, SCDs not in place, fall mat in place, bed alarm on,  chair alarm n/a and all questions answered. RN notified of session outcome and patient response.     Goals:  Goals  Goal Formulation: With patient  Time for Goal Acheivement: 5 visits  Goals: Select goal  Pt Will Go Supine To Sit: with supervision, to maximize functional mobility and  independence  Pt Will Perform Sit To Supine: with supervision, to maximize functional mobility and independence  Pt Will Perform Sit to Stand: with supervision, to maximize functional mobility and independence  Pt Will Ambulate: > 200 feet, with rolling walker, with supervision, to maximize functional mobility and independence (w/ LRAD)  Pt Will Go Up / Down Stairs: 1-2 stairs, with supervision, With rail, to maximize functional mobility and independence    PPE worn during session: gloves  Pt wore mask during therapy session:No    Tech present: No  PPE worn by tech: N/A    Time of Treatment  PT Received On: 12/31/22  Start Time: [id number]  Stop Time: 1007  Time Calculation (min): 23 min    Treatment # 3 out of 5 visits    Hal Hope, DPT, pager [phone]

## 2022-12-31 NOTE — Progress Notes (Signed)
I have reviewed the provider's history, physical exam, assessment and management plans as per the ICU resident/APP's note. I have seen and examined this patient.     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 This was discussed with the patient by Dr. Opal Sidles    Assessment  Cardiogenic shock  Acute on chronic systolic heart failure  AKI on CKD likely ATN due to cardiogenic shock  CLL  Thrombocytopenia likely due to above    Neuro: frailty  -PTOT  -ICU delirium precautions    CV: cardiogenic shock  -AHF declined for advanced therapies, consult palliative care, prognosis is very poor   -cont diuresis  -cont amio, ASA, statin, hydralazine and isosorbide dinitrate (no ACE/ARB/ARNI due to acute renal failure)      Pulm: goal sat 92%    ID: no active problems    Renal/Fluid, Electrolytes: AKI on CKD with no improvement with inotropic support, nonoliguric  -he is an extremely poor candidate for RRT as there is no advanced options for his heart failure as it is endstage heart failure, his prognosis is poor and it's unclear whether dialysis will offer any benefit  -monitor UOP  -nephrology following  -replete lytes PRN  -cont to diurese    Heme: chronic anemia    GI/Nurition: cardiac diet    Endo: maintain euglycemia    Lines/Tubes:   RIJ introducer with PAC  PIVs  External urinary catheter      PPx:   -DVT: heparin TID   -GI: none needed  Code status: full code    I have spent 39 minutes in the care of this patient with cardiogenic shock, acute on chronic systolic heart failure, acute on chronic renal failure, frailty. >50% of that time was spent in counseling and coordination of care. Any care time performed today is exclusive of teaching, billable procedures, and not overlapping with any other providers.

## 2022-12-31 NOTE — Plan of Care (Signed)
Admission Assessment: CTUS     Admitted from: CICU    Orientation: AOx4  VSS/other: Vitals are stable, RA, afebrile  Rhythm on tele: V-paced  Ambulation: assist x1 w/ walker  Lines/Drips: 18g LAC, 22g LFA; w/ RU chest permacath  GI/GU:  Cont x2; w/ EMC  Code Status:Full  Fall Score: High     Verified patient ID/arm bands. Ensured appropriate safety precautions in place including: assigned fall score interventions, review of known allergies, special needs, personal items within reach, and call bell within reach.      Critical Labs/Images/Procedures:  3/4 Tx to CICU for dobutamine.   3/4 echo EF 11%, mod pHTN, mod dialated LV, severe mitral & tricuspid regurg.   3/5 RHC for swan   3/5 Permacath placed.   3/13 dobutamine d/c'd, bumex gtt.   3/14 CXR: cardiomegaly.   3/14 not a candidate for LVAD, palliative consulted. Pt has poor understanding of prognosis, strong faith the he will be healed. Does not want hospice/palliative at this time.    3/14 Downgraded to CTUS    Comments:  *See doc flow for complete assessment and vitals*     Plan:   - Vitals Q4H  - Monitor HR/Rhythm   - Pain management  - IOs, DW  - HF, Palliative  following  - Not a candidate for transplant/LVAD  - PO amiodarone  - Monitor renal fxn  - BMP q12h  -Nephro, Palliative following  - PT/OT rec Home w/ sup     Skin Assessment  Cosign Note:  RN Thayer Headings     Skin WNL EXCEPT for:    Neck: puncture injury from previous line; OTA  Chest: RT permacath   LUE: Scattered bruising and abrasions  RUE: Scattered bruising and abrasions  RUE: Radial puncture site from RHC w/ pressure dressing  BACK: Skin discoloration extending to RT shoulder  SACRUM: skin discoloration  BUTTOCKS/INGUINAL/PERINEAL:  LLE:  Scattered abrasion; edema + 1; blanchable redness heels  RLE:  Scattered bruising ; edmea + 1; blanchable redness heels  OTHER:       ADMIT 3/4 to ICU with cardiogenic shock & AKI on CKD. SVT w/failed CV, successful adenosine. Tx from [facility] for LVAD eval.     PMHx:  HLD. HTN. DM2. 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

## 2022-12-31 NOTE — Progress Notes (Addendum)
Cardiac Intensive Care Unit (CICU)  Progress Note      Patient Name: [name] [name]  MRN: [id number]  Room: FI103/FI103-01    ICU Day: 3d 10h      Interval Events     24 hour events:   -No acute events     Subjective     [name] [name] is doing well this morning. No acute issues. Discussed next steps including transfer to medical floor.     Hospital Course:  3/4: RHC placed.   3/5: Diuresis, good UOP.   3/6: Started vanc and zosyn. Blood culture positive, repeat sent. RML possible infiltrate on CXR. CI: 1.7 overnight, lactate cleared. Wife to bring in CLL medication.   3/7: Improving CI overnight. Occasional 8 beat runs once or twice per shift. Antibiotics discontinued.   3/8: Lasix 120 at 9pm last night. Reports of Swan issues, some higher PAP due to line issues.  Cardiac index stabile, lactic acid clear  3/10: Lactate up to 2.7 with decreasing UOP and Cr up to 4.6. Readmitted to the CICU.   3/11: Continued to diurese. Dobutamine decreased to 2.5.   3/12: continued diuresis and dobutamine stopped  3/13: Pt not candidate for LVAD  3/14: Transfer order placed    Home medications include Eliquis 2.5 mg BID, ASA 81 mg QD, Lipitor 40 mg QHS, Jardiance 10 mg QD, Hydralazine 25 mg Q8H, Isosorbide Dinitrate 10 mg TID, metoprolol succinate XL 12. 5 mg QD, torsemide 40 mg BID, Lantus 50U QD. Was previously on Aldactone but discontinued given renal function. Dry weight is around 185 lb per 10/2022     Assessment & Plan   Assessment and Plan   Summary: [name] [name] a 77 y.o. male with PMHx NICM/ICM s/p [employer] ICD, CAD s/p PCI with DES, T2DM, stage IV CLL, CKD3b, RLE DVT on home Eliquis admitted to [facility] for SVT and transferred to [place] for LVAD evaluation where diuresis and cardiac medications were optimized, and pt was downgraded to the medicine floor on 3/8, with subsequent readmission to CICU on 3/10 for worsening renal function. Currently now off dobutamine.     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  -MCS: [employer] ICD  -Advanced Therapies: not a candidate      # Heart Failure   Diurese as above  GDMT:  Beta Blockers: hold in the setting of shock  ACEi/ARB/ARNI: hold in the setting of worsening kidney function  MRA: hold in the setting of worsening kidney function  SGLT2i:  hold in the setting of worsening kidney function    # CAD s/p DES 2011  Continue aspirin '81mg'$  daily   Continue atorvastatin '40mg'$     # NSVT s/p unsuccessful cardioversion 3/4   Converted to NSR s/p adenosine.   Amiodarone '200mg'$  QD    # RLE DVT on home Eliquis  Therapeutic heparin for DVT held in the setting of thrombocytopenia  and bleeding from insertion sites    MAP goal 65-75     PULMONARY:   No acute concerns    Last vent settings if on vent:     Ventilator Time (if on vent):       Supplemental oxygen as needed to keep sat > 92%   RENAL:   Acute on chronic renal failure      Intake/Output Summary (Last 24 hours) at 12/31/2022 0819  Last data filed at 12/31/2022 0700  Gross per 24 hour   Intake 753.03 ml   Output 5675 ml   Net -4921.97 ml        # Acute on Chronic Kidney Disease   CKD 3B; baseline serum creatinine around  2.5 mg/dL  Nephrology following:             -No indication for RRT at this time            -Possible outpatient RRT   GASTROINTESTINAL:  No acute concerns    Last Bowel Movement:   Last BM Date: 12/30/22   Nutrition: Heart Healthy diet changed to regular diet to promote caloric intake  Bowel Regimen: miralax and senna, regular BMs   LFTs improving     INFECTIOUS DISEASE:   No acute concerns     Temp (24hrs), Avg:98 F (36.7 C), Min:97.7 F (36.5 C), Max:98.3 F (36.8 C)     * No values recorded between 12/25/2022  2:07 PM and 12/31/2022  8:19 AM *  Lab Results   Component Value Date    WBC 9.55 (H) 12/31/2022      CAUTI Prevention (n/a if blank):  Foley Day:         Blood Steam Infection Prevention (n/a if blank):   Implanted Catheter 12/25/22 Hohn Tunneled Right Internal jugular  Peripheral Arterial Line 12/27/22 Right Radial   Last culture dates: blood cultures 12/23/22 TGTD x5 days           HAI Prevention Plan  Initiate/Maintain Central Line (CVC/PICC) for titration of cardiac medications       HEME/ONC:   Chronic Lymphocytic Leukemia  Thrombocytopenia     # Thrombocytopenia   -Unclear etiology.  -Nutritional studies and hemolysis labs normal, HIT Ab negative, SRA in process.   -Per Oncology team: "Suspect likely due to acute illness with cardiogenic shock and AKI necessitating inotropic support, less likely immune thrombocytopenia in the setting of CLL given overall good control of disease."  Heme-Onc following  CTM  Plt count >50k in the absence of bleeding   Therapeutic heparin for DVT held in the setting of thrombocytopenia     # Chronic Lymphocytic Leukemia  Initiated on acalabrutinib 04/2022 for worsening leukocytosis and adenopathy.  Acalabrutinib held in the setting of thrombocytopenia  Heme-Onc following         SCDs  DVT chemoprophylaxis: heparin (porcine) injection 5,000 Units      ENDO/RHEUM:  Type II Diabetes   Goal glucose range 100 - 180 WNL        Current Infusions (n/a if blank):    bumetanide 3  mg/hr (  12/31/22 0700)           Objective      Examination:   Temp:  [97.7 F (36.5 C)-98.3 F (36.8 C)] 97.7 F (36.5 C)  Heart Rate:  [77-90] 82  Resp Rate:  [22-81] 63  BP: (101-142)/(52-63) 133/63  Arterial Line BP: (101-139)/(40-60) 129/56  SpO2: 94-98% on RA  Maps: 75-85    Neuro:    alert & oriented x 3 , appropriate mood and affect    Lungs:   clear to auscultation, no wheezes, rales or rhonchi, symmetric air entry     Cardiac:   normal rate, regular rhythm, normal S1, S2, no murmurs, rubs, clicks or gallops, JVD 12cmH2O    Abdomen:    soft, nontender, nondistended, no masses or organomegaly     Extremities:   peripheral pulses normal no pedal edema    Skin:     Warm, dry and intact.    Fick/Cardiac Hemodynamics   VO2 Determination  Height: 182.9 cm (6' 0.01")  Weight FICK (only): 96 kg (211 lb 10.3 oz)  BSA (Fick only) (Calculated - sq m): 2.21 sq meters  VO2: 276.25    CI (L/min/m2): 3.2 L/min/m2    CO/CI Determination  SaO2 : 0.96  SvO2 : 0.75  SaO2 - SvO2: 0.21  HGB Result: 10.1  x 1.36 x 10: 28.85  Cardiac Output (FICK): 9.58  Cardiac Index (FICK): 4.33  Cardiac Power Output: 1.44    PVR Determination  PAP: 72/28  PAP (Mean): 32 mmHg  PAWP (mmHg): 18 mmHg  Pulmonary Artery Pulsatility Index (PAPI): 2.44    SVR Determination  MAP (mmHg): 82  CVP (mmHg): 18 mmHg  MAP - CVP: 58  SVR (dynes/second/cm3): 484.34 dynes/second/cm3       Most Recent Labs  9.55* \ 9.3* / 113*    / 27.6* \    03/14 0357    134* 95* 84.0* / 105*   3.8 22 4.2* \    03/14 0357            Additional Diagnoses:     Patient has BMI=Body mass index is 25.77 kg/m.  Diagnosis: Overweight based on BMI criteria                Recent Labs     12/31/22  0357 12/30/22  0408 12/29/22  0345   Platelets 113* 85* 78*       Diagnosis: Moderate Thrombocytopenia                   Recent Labs     12/31/22  0357 12/30/22  2148 12/30/22  1621 12/30/22  1131 12/30/22  0408 12/29/22  2135 12/29/22  1643 12/29/22  1022 12/29/22  0345 12/28/22  2214  12/28/22  1638 12/28/22  1007   Sodium 134* 134* 133* 135 134* 132* 131* 130* 130* 130* 129* 130*       Diagnosis: No additional diagnosis                    Recent Labs   Lab 12/31/22  0357 12/30/22  0408 12/29/22  0345   Hgb 9.3* 8.7* 9.0*   Hematocrit 27.6* 26.5* 27.3*   MCV 98.2* 100.8* 100.4*   WBC 9.55* 8.46 7.51   Platelets 113* 85* 78*       Recent Labs   Lab 12/24/22  1513 12/24/22  0912 12/22/22  1430 12/21/22  2247   Ferritin  --   --  57.00  --    Iron  --   --   --  32*   TIBC  --   --   --  307   Iron Saturation  --   --   --  10*   Folate 12.6  --   --   --    Haptoglobin  --  165  --   --        Anemia Diagnosis: No new diagnosis              Vanessa Ralphs., DO  [facility] Internal Medicine, PGY-2

## 2022-12-31 NOTE — Progress Notes (Signed)
CARDIOLOGY CRITICAL CARE CONSULT NOTE      Patient's Name: [name] [name]   Admit Date: 12/21/2022  Medical Record Number: [id number]   Code Status: full code  Room: FI103/FI103-01  Date/Time: 12/31/22 9:30 AM  Cardiologist of record: Dr [name] [name]    Subjective:  Net 4.9 L negative  Overnight, dobutamine was weaned off, PA catheter was removed  CXR reviewed: appears stable  Labs with creatinine 4.7 and lactate 0.7.    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 At this moment from a CLL standpoint, he is quite stable.  Right lower extremity DVT (5/23)   Type 2 diabetes with an A1c 5.9%  Stable COPD  Multiple hospital admissions for acute decompensated heart failure in 2023  DM2  Thrombocytopenia  Iron deficit anemia   Iron studies (3/6): Iron sat 10, Ferritin 57  S/p Iron repletion (iron sucrose 200 mg x5 from 3/6 to 3/10)  Depression   Consult to psych   Aspiration event  Aspirated on 12/22/22, passed swallow eval thereafter     Recommendations:     Cardiogenic shock  Preload: Continue bumex gtt at 3 mg/hr, give 1 g diuril BID  Inotropes: Remains off of inotropes  Afterload: Continue hydralazine 100 mg TID and increase isordil to 40 TID. would target a systolic of 123XX123 mmHg and hold meds if below.   MCS: none indicated, not a  candidate  Advanced therapies candidacy is undergoing for potential LVAD. He was declined by the committee on 3/13 due to multiple barriers including: biventricular dysfunction, worsening AKI on stage 4 CKD, limited functional status, and frailty.   HF/Txp team discussed this decision with the patient and wife in detail and patient is amenable to palliative care consultation. Please place formal consult.   Ischemic Cardiomyopathy, EF 11%  Diuresis as above  GDMT:  Beta Blockers: hold while wet, consider starting when he closer to euvolemia  ACEi/ARB/ARNI: hold in the setting of worsening kidney function  MRA: hold in the setting of worsening kidney function  SGLT2i: hold in the setting of worsening kidney function  CAD  S/p multiple stents   Right lower extremity DVT in 02/2022  Thrombocytopenia  ASA 81 mg daily, atorvastatin 40 mg daily  At home on eliquis (transitioned to heparin gtt in the hospital), on hold due to bleeding from the lines and ears  Narrow complex tachycardia with failed ablation  NSVT in the last few days  Continue amiodarone 200 mg daily  Rest as per [name] discussed during rounds with primary team and RN  Goals of care discussion and palliative care was consulted  Can be transferred out of CICU    Signed by: [name] Levels, MD, Cardiology Fellow  Date/Time: 12/31/22 9:30 AM    CICU Cardiology Attending addendum:  Patient seen/examined with cardiology fellow Dr. Murvin [name].   Exam notable for RRR, normal S1-S2, clear lungs, 1+ bilateral LE edema.   24 hour events: PA diastolic pressures [id number] overnight with CVP of 14-18, -5 L with IV diuresis overnight.  PA catheter removed overnight.  Declined for LVAD by heart failure committee 3/13 due to multiple barriers including biventricular dysfunction, worsening renal insufficiency on a backdrop of stage IV CKD, limited functional status, and frailty.  My assessment/plan as noted above with the following additions:     Impressions:  77 year old M with  nonischemic cardiomyopathy, EF 20%, CKD, CLL on chemotherapy, prior DVT, CAD s/p multivessel DES to the RCA and LAD in the past, severe functional MR, severe TR, type II DM, initially admitted to [facility] for SVT, then transferred for cardiogenic shock.  CICU course notable for inotropic therapy with dobutamine, subsequent rising lactate 3/10 on the floor necessitating transfer back to the CICU and replacement of PA catheter for closer hemodynamic monitoring.     Recommendations:  Continue IV diuresis with bumex drip, can likely transition to oral regimen in 1-2 days  Declined for LVAD by heart failure committee 3/13 due to multiple barriers including biventricular dysfunction, worsening renal insufficiency on a backdrop of stage IV CKD, limited functional status, and frailty.  Palliative care consultation given lack of options in terms of transplant/LVAD.  Holding BB in setting of shock, and ARNI/MRA/SGLT2i in setting of worsening renal failure.  Can downgrade out of the CICU when bed is available     Patient independently examined by me.  Pertinent medical records, studies, labs, imaging studies independently reviewed by me. Treatment of an organ system (cardiac) with high probability of life-threatening deterioration and need for life-saving interventions. Critical care time spent excluding procedures/teaching 30 minutes from 730AM to 8AM.      Dr. Darrell [name]. [name] Rua, MD [facility] Attending  [place] [phone]  Reason for consult:     Reason for Admission to CICU (select one)  Primary Cardiac Problem warranting ICU care    Presenting Primary Cardiac Problem (select one)  Cardiogenic shock (etiology not otherwise listed)    Presenting Secondary Cardiac Problem (select one)  Heart failure     Indications (select one)  Cardiogenic shock      History:     77 y.o. 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  He was given a dose of adenosine and had an unsuccessful cardioversion with 120 J.  Adenosine eventually converted to sinus rhythm and telemetry showed a sensed V paced rhythms.     Admitted to ICU at [facility] for further management. However, then transferred to [facility] on new Dobutamine 2.5 mcg/kg/min for LVAD consideration. He was diuresed appropriately and then transferred to the medicine floor on dobutamine at 5. Due to increased lactic acidosis, his dobutamine was increased on the floor to 10. Today his creatinine trended up and his urine output decreased, therefore he was readmitted to the CICU for treatment of cardiogenic shock.      Of note follows with [place] heart cardiology. Has been followed by Advanced Heart Failure for advanced therapies, including LVAD where he has been seen by Dr. Judeth [name]. Home medications include Eliquis 2.5 mg BID, ASA 81 mg  QD, Lipitor 40 mg QHS, Jardiance 10 mg QD, Hydralazine 25 mg Q8H, Isosorbide Dinitrate 10 mg TID, metoprolol succinate XL 12. 5 mg QD, torsemide 40 mg BID, Lantus 50U QD. Was previously on Aldactone but discontinued given renal function. Dry weight is around 185 lb per 10/2022, weight today around 195-199 lb.     Follows with Dr. Mervin Kung of [facility] for CLL. Dx'd in 06/2020. Thought to be well controlled on acalabrutinib.     Follows with [facility] for CKD. Cr increased from 1.5 in 03/2022 to 2.3 in 09/2022. Previously on HD/UF in 2018.      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  He lives with his wife in [place], previously was living in [place] but due to high cost of living and exorbitant housing rates, he moved to [place].  He has 3 sons and 1 daughter, and 1 grandchild.   They all live in different parts of the [place] and no one is close by.  His primary caregiver is his wife       PMH:    Past Medical History:   Diagnosis Date    Acute CHF     NOV  & DEC 2018, - DIALYSIS CATH PLACED Aug 24 2017 TO REMOVE FLUID     Acute systolic (congestive) heart failure 08/2017    Anxiety     CAD (coronary artery disease)     Cardiomyopathy     nonischemic    CHF (congestive heart failure) 08/12/2014, 2013    Chronic obstructive pulmonary disease     POSSIBLE PER PT HE USES  SYNBICORT BID ABD  BREO INHALER PRN    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    Coronary artery disease 2011    CORONARY STENT PLACEMENT FOLLOWED BY [facility]    Depression     echocardiogram 02/2016, 11/2016, 09/2017, 12/20/2017    GERD (gastroesophageal reflux disease)     Heart attack 2011    and 2014    Hyperlipemia     Hypertension     ICD (implantable 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     PLACED 2014  [facility]  LAST INTERROGATION April 01 2018 REPORT REQUESTED    Ischemic cardiomyopathy     EF 15% ON ECHO 09-20-2017 DR GARG IN EPIC    Nonischemic cardiomyopathy     NSTEMI (non-ST elevated myocardial infarction)     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    Pacemaker 2014    MEDTRONIC ICD/PACEMAKER COMBO LAST INTERROGATION  12-12 2018    Pneumonia 09/2016    Primary cardiomyopathy     Ischemic cardiomyopathy EF 15% ON ECHO 09-20-2017 DR GARG IN EPIC    Sepsis 08/2017    Syncope and collapse     PRIOR TO ICD/PACEMAKER PLACED 2014    Type 2 diabetes mellitus, controlled DX 1998    BS  AVG 100  A1C 7.4 Dec 27 2017    [place]     PT SEE HIS PMD April 01 2018 RE THIS-TOLD TO SEE PMD BY [facility] JUNE 12 WHEN THEY SAW HIM FOR A CL       PSH:    Past Surgical History:   Procedure Laterality Date    BIV  11/10/2016    ICD METRONIC  UPGRADE     CARDIAC CATHETERIZATION  03/2010    CORONARY STENT PLACEMENT X 2 PER PT; LM normal,  LAD 70-80% distal lesion at apex, 20% lesion mid CFX    CARDIAC CATHETERIZATION  12/2012    CARDIAC DEFIBRILLATOR PLACEMENT   12/2012    [facility]    CARDIAC PACEMAKER PLACEMENT  2014    MEDTRONIC ICD/PACEMAKER PLACED  HEART    CIRCUMCISION  AGE 63    COLONOSCOPY  2009    CORRECTION HAMMER TOE      duodenal ulcer  1973    STRESS RELATED IN SCHOOL    ECHOCARDIOGRAM, TRANSTHORACIC  02/2016 11/2016 09/2017 12/2017    ECHOCARDIOGRAM, TRANSTHORACIC  02/2016,11/2016,12/20/2017    EGD N/A 08/15/2014    Procedure: EGD;  Surgeon: Loraine Maple, MD;  Location: [facility];  Service: Gastroenterology;  Laterality: N/A;  egd w/ bx    EGD  1975    EGD, COLONOSCOPY N/A 04/04/2018    Procedure: EGD with bxs, COLONOSCOPY with polypectomy and clipping;  Surgeon: Omer Jack, MD;  Location: [facility];  Service: Gastroenterology;  Laterality: N/A;    FRACTURE SURGERY  AGE 66     RT  ANKLE- CAST APPLIED    HERNIA REPAIR  AGE 63    LEFT INGUINAL HERNIA    MPI nuclear study  06/2016    orthopedic surgery  AGE 78    RT foot corrective - [facility]    RIGHT HEART CATH Right 09/09/2022    Procedure: Right Heart Cath;  Surgeon: Wandra Arthurs, MD;  [facility];  Service: Cardiovascular;  Laterality: Right;  tx from [place] INCL. CO AND SATS Right 12/22/2022    Procedure: Right Heart Cath ONLY incl. CO and Sats;  Surgeon: Day, Lillette Boxer, MD;  Location: [facility];  Service: Cardiovascular;  Laterality: Right;  Right Heart Cath with leave in PA cath for milrinone initiation    [place]    TUNNELED CATH PLACEMENT (PERMCATH) N/A 12/25/2022    Procedure: [facility];  Surgeon: Estelle Grumbles, MD;  Location: [facility];  Service: Interventional Radiology;  Laterality: N/A;       FH:  Family History   Problem Relation Age of Onset    Breast cancer Mother     Heart attack Mother 58    Hypertension Mother     Diabetes Mother     Coronary artery disease Mother     Diabetes Brother     Heart attack Father     Hypertension Father        Social History:  Social History     Socioeconomic  History    Marital status: Married     Spouse name: Architect    Number of children: 0    Years of education: Not on file    Highest education level: Not on file   Occupational History    Occupation: Pharmacist, 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[place], history    Tobacco Use    Smoking status: Never    Smokeless tobacco: Never   Vaping Use    Vaping Use: Never used   Substance and Sexual Activity    Alcohol use: Not Currently    Drug use: No    Sexual activity: Yes     Partners: Female   Other Topics Concern    Not on file   Social History Narrative    Not on file     Social Determinants of Health     Financial Resource Strain: Low Risk  (12/22/2022)  Overall Financial Resource Strain (CARDIA)     Difficulty of Paying Living Expenses: Not hard at all   Food Insecurity: No Food Insecurity (09/12/2022)    Hunger Vital Sign     Worried About Running Out of Food in the Last Year: Never true     Ran Out of Food in the Last Year: Never true   Transportation Needs: No Transportation Needs (12/22/2022)    PRAPARE - Armed forces logistics/support/administrative officer (Medical): No     Lack of Transportation (Non-Medical): No   Physical Activity: Not on file   Stress: Not on file   Social Connections: Not on file   Intimate Partner Violence: Not At Risk (09/12/2022)    Humiliation, Afraid, Rape, and Kick questionnaire     Fear of Current or Ex-Partner: No     Emotionally Abused: No     Physically Abused: No     Sexually Abused: No   Housing Stability: Low Risk  (12/22/2022)    Housing Stability Vital Sign     Unable to Pay for Housing in the Last Year: No     Number of Places Lived in the Last Year: 1     Unstable Housing in the Last Year: No       Allergies:  Allergies   Allergen Reactions    Allopurinol        Home medications:   Prior to Admission medications    Medication Sig Start Date End Date Taking? Authorizing Provider   apixaban (ELIQUIS) 5 MG Take 1 tablet (5 mg) by mouth every 12 (twelve) hours For 1 week followed by 1 tablet twice daily 09/14/22   Irma Newness, MD   aspirin EC 81 MG EC tablet Take 1 tablet (81 mg) by mouth daily    [provider]   atorvastatin (LIPITOR) 40 MG tablet Take 1 tablet (40 mg total) by mouth nightly 07/16/21   Carlis Abbott, MD   Calquence 100 MG Tab  04/07/22   [provider]   hydrALAZINE (APRESOLINE) 25 MG tablet Take 1 tablet (25 mg) by mouth every 8 (eight) hours 09/14/22 12/13/22  Irma Newness, MD   Insulin Glargine-yfgn (Semglee, yfgn,) 100 UNIT/ML Solution Pen-injector 50 Units by Subdermal route daily 10/26/22   Clearance Coots, MD   isosorbide dinitrate (ISORDIL) 10 MG tablet Take 1 tablet (10 mg) by mouth 3 (three) times daily 09/14/22 12/13/22  Irma Newness, MD   Jardiance 10 MG tablet TAKE 1 TABLET(10 MG) BY MOUTH DAILY 06/05/22   Clotilde Dieter, NP   loratadine (CLARITIN) 10 MG tablet Take 1 tablet (10 mg) by mouth daily 04/18/22   Regis Bill, MD   metoprolol succinate XL (TOPROL-XL) 25 MG 24 hr tablet Take 0.5 tablets (12.5 mg) by mouth daily 05/22/22   Clotilde Dieter, NP   niacin (NIASPAN) 500 MG CR tablet TAKE 1 TABLET BY MOUTH TWICE DAILY  Patient taking differently: 250 mg Taking half a tablet 02/23/22   Clearance Coots, MD   torsemide (DEMADEX) 20 MG tablet Take 2 tablets (40 mg) by mouth 2 (two) times daily 09/14/22   Irma Newness, MD        Inpatient/ER medications:  Medications Prior to Admission   Medication Sig    apixaban (ELIQUIS) 5 MG Take 1 tablet (5 mg) by mouth every 12 (twelve) hours For 1 week followed by 1 tablet twice daily    aspirin EC 81 MG EC tablet Take 1 tablet (81  mg) by mouth daily    atorvastatin (LIPITOR) 40 MG tablet Take 1 tablet (40 mg total) by mouth nightly    Calquence 100 MG Tab     hydrALAZINE (APRESOLINE) 25 MG tablet Take 1 tablet (25 mg) by mouth every 8 (eight) hours    Insulin Glargine-yfgn (Semglee, yfgn,) 100 UNIT/ML Solution Pen-injector 50 Units by Subdermal route daily    isosorbide dinitrate (ISORDIL) 10 MG tablet Take 1 tablet (10 mg) by mouth 3 (three) times  daily    Jardiance 10 MG tablet TAKE 1 TABLET(10 MG) BY MOUTH DAILY    loratadine (CLARITIN) 10 MG tablet Take 1 tablet (10 mg) by mouth daily    metoprolol succinate XL (TOPROL-XL) 25 MG 24 hr tablet Take 0.5 tablets (12.5 mg) by mouth daily    niacin (NIASPAN) 500 MG CR tablet TAKE 1 TABLET BY MOUTH TWICE DAILY (Patient taking differently: 250 mg Taking half a tablet)    torsemide (DEMADEX) 20 MG tablet Take 2 tablets (40 mg) by mouth 2 (two) times daily       Current Inpatient :  Current Facility-Administered Medications   Medication Dose Route Frequency    amiodarone  200 mg Oral Daily    aspirin EC  81 mg Oral Daily    atorvastatin  40 mg Oral QHS    heparin (porcine)  5,000 Units Subcutaneous Q8H [place]    hydrALAZINE  100 mg Oral Q8H [place]    insulin glargine  33 Units Subcutaneous Q12H    Or    insulin glargine  15 Units Subcutaneous Q12H    insulin lispro  1-6 Units Subcutaneous QHS    insulin lispro  10 Units Subcutaneous TID AC    insulin lispro  2-10 Units Subcutaneous TID AC    isosorbide dinitrate  20 mg Oral TID - Nitrate Free Interval    niacin  500 mg Oral BID    polyethylene glycol  17 g Oral Daily    senna-docusate  1 tablet Oral Q12H [facility]      bumetanide 3 mg/hr (12/31/22 0700)         Objectives:       ROS:  No constitutional complaints, no respiratory complaints, no musculoskeletal complaints, or other pertinent review of symptoms except as noted in the history of present illness.    Vitals:  Vitals:    12/31/22 0700   BP:    Pulse: 82   Resp: (!) 63   Temp:    SpO2: 95%        Intake / Output:    Intake/Output Summary (Last 24 hours) at 12/31/2022 0930  Last data filed at 12/31/2022 0900  Gross per 24 hour   Intake 1015.53 ml   Output 5675 ml   Net -4659.47 ml          Weight:      12/30/2022    10:53 AM 12/30/2022     4:21 PM 12/31/2022     5:56 AM   Weight Monitoring   Height 182.9 cm 182.9 cm    Weight   86.2 kg   BMI (calculated)   25.8 kg/m2        Weight change: -7.15 kg (-15 lb 12.2 oz)      Physical Exam:  General: Healthy-appearing, in no distress.  Alert and oriented x 3  Eyes: No xanthelasma.  Oropharynx: No mucosal pallor.  Neck: Trachea midline, JVD up to the ear  Pulmonary: crackles at the bases  Cardiovascular:  PMI nondisplaced with normal rate and rhythm, normal S1, normal S2, holosystolic murmur   Abdomen: Soft, nontender.  Extremities: no edema, pulses present bilaterally  Skin: multiple excoriations with visible venous small oozing of blood.    Psych:  Mood was normal    POCUS:  LV: severely dilated with reduced function (visually estimated EF 10% range) and LVIDd 7.0 cm  RV: severe dilated with reduced annular escursion (TAPSE: 1.2 cm RV S' 6 cm/s)  Atria: biatrial enlargement  Aortic valve: no significant AI visualized, valve appears to open well  Pulmonary Valve: not well visualized  Mitral Valve: likely ventricular functional mitral regurgitation at least moderate at color doppler  Tricuspid valve: likely severe TR in the presence of pacemaker crossing the TV  IVC: dilated to 2.5 cm non compressible  Pericardium: trace pericardial effusion    Pacemaker interrogation:  PPM Interrogation    Device type CRT-D device   Manufacturer Medtronic   Model Claria MRI Quad   Implant date 11/10/2016 (Dr. Efrain Sella)   Indication  Resynchronization and primary prevention     Presenting rhythm:  A-sensed, V-paced  Underlying rhythm:  sinus rhythm with LBBB    Leads  RA 1.50 V @ 0.40 ms 0.30 mV 342 ohms   RV 2.00 V @ 0.40 ms 0.30 mV 285 ohms   LV 2.25 V @ 0.40 ms  513 ohms     Diagnostic Data  A-paced 1.2 %   V-paced 93.6 % (91.9% effective)     High rate episodes  A-episodes None recently   V-episodes Few short NSVT, longer of which was 15 seconds long on December 25 2022     Battery status:  satisfactory (remains 19 months)    Final Parameters:  Mode  DDDR    Lower-Upper Rate 60 - 171 bpm    VT1 <171 bpm Observe only   VT 171 -200 bpm OFF   VF >200bpm shock     Notes / Changes made:  No changes were  made           EKG:  A sensed V paced rhythm with normal QRS    Labs:  CBC        12/31/22  0357 12/30/22  0408 12/29/22  0345   WBC 9.55* 8.46 7.51   Hgb 9.3* 8.7* 9.0*   Hematocrit 27.6* 26.5* 27.3*   Platelets 113* 85* 78*         BMP        12/31/22  0357 12/30/22  2148 12/30/22  1621 12/30/22  1131 12/30/22  1053 12/30/22  0408 12/26/22  1705 12/26/22  1141   BUN 84.0* 79.0* 79.0*   < >  --  79.0*   < >  --    Creatinine 4.2* 4.3* 4.5*   < >  --  4.7*   < >  --    eGFR 13.9* 13.5* 12.8*   < >  --  12.2*   < >  --    Sodium 134* 134* 133*   < >  --  134*   < >  --    Potassium 3.8 3.9 4.1   < >  --  4.0   < >  --    Chloride 95* 95* 95*   < >  --  97*   < >  --    CO2 '22 25 25   '$ < >  --  24   < >  --  Calcium 9.6 9.3 9.1   < >  --  8.8   < >  --    Magnesium 2.2 2.3 2.3   < >  --  2.4   < >  --    Lactic Acid  --   --  0.9  --  1.1 0.7   < > 2.3*   NT-proBNP  --   --   --   --   --   --   --  9,403*    < > = values in this interval not displayed.         LFTs        12/31/22  0357 12/30/22  0408 12/29/22  0345   AST (SGOT) '23 26 28   '$ ALT 51 62* 82*   Alkaline Phosphatase 65 64 62   Bilirubin, Total 1.7* 1.5* 1.8*   Bilirubin Direct 0.8* 0.8* 0.7*   Bilirubin Indirect 0.9 0.7 1.1*         Coags        12/27/22  0410 12/26/22  2024 12/26/22  0409   Anti-Xa, UFH 0.09 0.46 <0.04       Microbiology  Microbiology Results (last 15 days)       Procedure Component Value Units Date/Time    MRSA culture [id number] Collected: 12/23/22 1200    Order Status: Completed Specimen: Culturette from Nasal Swab Updated: 12/24/22 1311     Culture MRSA Surveillance Negative for Methicillin Resistant Staph aureus    MRSA culture [id number] Collected: 12/23/22 1200    Order Status: Completed Specimen: Culturette from Throat Updated: 12/24/22 1311     Culture MRSA Surveillance Negative for Methicillin Resistant Staph aureus    Culture Blood Aerobic and Anaerobic [id number] Collected: 12/22/22 2239    Order Status: Completed  Specimen: Blood, Venipuncture Updated: 12/28/22 0425    Narrative:      The order will result in two separate 8-65m bottles  Please do NOT order repeat blood cultures if one has been  drawn within the last 48 hours  UNLESS concerned for  endocarditis  AVOID BLOOD CULTURE DRAWS FROM CENTRAL LINE IF POSSIBLE  Indications:->Pneumonia  ORDER#: [id number]                                   ORDERED BY: [employer]ear Clock SOURCE: Blood, Venipuncture                          COLLECTED:  12/22/22 22:39  ANTIBIOTICS AT COLL.:                                RECEIVED :  12/23/22 02:00  Culture Blood Aerobic and Anaerobic        FINAL       12/28/22 04:25  12/28/22   No growth after 5 days of incubation.      Culture Blood Aerobic and Anaerobic [[id number]: 12/22/22 2239    Order Status: Completed Specimen: Blood, Venipuncture Updated: 12/28/22 0425    Narrative:      The order will result in two separate 8-142mbottles  Please do NOT order repeat blood cultures if one has been  drawn within the last 48 hours  UNLESS concerned for  endocarditis  AVOID BLOOD CULTURE DRAWS FROM CENTRAL LINE IF POSSIBLE  Indications:->Pneumonia  ORDER#: [id number]                                    ORDERED BY: [facility]  SOURCE: Blood, Venipuncture                          COLLECTED:  12/22/22 22:39  ANTIBIOTICS AT COLL.:                                RECEIVED :  12/23/22 01:59  Culture Blood Aerobic and Anaerobic        FINAL       12/28/22 04:25  12/28/22   No growth after 5 days of incubation.      Respiratory Pathogen Panel w/COVID-19, PCR [id number]     Order Status: Canceled     COVID-19 (SARS-CoV-2) and Influenza A/B, NAA (Liat Rapid)- Admission [id number] Collected: 12/21/22 1050    Order Status: Completed Specimen: Culturette from Nasopharyngeal Updated: 12/21/22 1144     Purpose of COVID testing Diagnostic -PUI     SARS-CoV-2 Specimen Source Nasal Swab     SARS CoV 2 Overall Result Not Detected     Comment:  __________________________________________________  -A result of "Detected" indicates POSITIVE for the    presence of SARS CoV-2 RNA  -A result of "Not Detected" indicates NEGATIVE for the    presence of SARS CoV-2 RNA  __________________________________________________________  Test performed using the Roche cobas Liat SARS-CoV-2 assay. This assay is  only for use under the Food and Drug Administration's Emergency Use  Authorization. This is a real-time RT-PCR assay for the qualitative  detection of SARS-CoV-2 RNA. Viral nucleic acids may persist in vivo,  independent of viability. Detection of viral nucleic acid does not imply the  presence of infectious virus, or that virus nucleic acid is the cause of  clinical symptoms. Negative results do not preclude SARS-CoV-2 infection and  should not be used as the sole basis for diagnosis, treatment or other  patient management decisions. Negative results must be combined with  clinical observations, patient history, and/or epidemiological information.  Invalid results may be due to inhibiting substances in the specimen and  recollection should occur. Please see Fact Sheets for patients and providers  located:  https://www.[employer].com/          Influenza A Not Detected     Influenza B Not Detected     Comment: Test performed using the Roche cobas Liat SARS-CoV-2 & Influenza A/B assay.  This assay is only for use under the Food and Drug Administration's  Emergency Use Authorization. This is a multiplex real-time RT-PCR assay  intended for the simultaneous in vitro qualitative detection and  differentiation of SARS-CoV-2, influenza A, and influenza B virus RNA. Viral  nucleic acids may persist in vivo, independent of viability. Detection of  viral nucleic acid does not imply the presence of infectious virus, or that  virus nucleic acid is the cause of clinical symptoms. Negative results do  not preclude SARS-CoV-2, influenza A, and/or influenza B  infection and  should not be used as the sole basis for diagnosis, treatment or other  patient management decisions. Negative results must be combined with  clinical observations, patient history, and/or epidemiological information.  Invalid results may be due to inhibiting substances in the specimen and  recollection should occur.  Please see Fact Sheets for patients and providers  located: http://[facility].info/.         Narrative:      o Collect and clearly label specimen type:  o PREFERRED-Upper respiratory specimen: One Nasal Swab in  Transport Media.  o Hand deliver to laboratory ASAP  Diagnostic -[facility]    Respiratory Pathogen Panel w/COVID-19, PCR [id number] Collected: 12/21/22 0106    Order Status: Canceled Specimen: Nasopharyngeal     Culture Blood Aerobic and Anaerobic [id number] Collected: 12/21/22 0021    Order Status: Completed Specimen: Blood, Venipuncture Updated: 12/26/22 [id number]    Narrative:      Indications:->Other  Other->infection  ORDER#: [id number]                                    ORDERED BY: LEE, SIEW MEI  SOURCE: Blood, Venipuncture RAC                      COLLECTED:  12/21/22 00:21  ANTIBIOTICS AT COLL.:                                RECEIVED :  12/21/22 04:06  Culture Blood Aerobic and Anaerobic        FINAL       12/26/22 06:37  12/26/22   No growth after 5 days of incubation.      Culture Blood Aerobic and Anaerobic [id number] Collected: 12/21/22 0021    Order Status: Completed Specimen: Blood, Venipuncture Updated: 12/26/22 1147    Narrative:      [id number] called Micro Results of Pos Bld. Results read back [id number], by [id number]  on 12/22/2022 at 07:29  Indications:->Other  Other->infection  ORDER#: [id number]                                    ORDERED BY: LEE, SIEW MEI  SOURCE: Blood, Venipuncture LAC                      COLLECTED:  12/21/22 00:21  ANTIBIOTICS AT COLL.:                                RECEIVED :  12/21/22 04:06  [id number] called Micro Results of Pos Bld. Results  read back [id number], by [phone] on 12/22/2022 at 07:29  Culture Blood Aerobic and Anaerobic        FINAL       12/26/22 11:47   +  12/22/22   Aerobic Blood Culture Positive in 24 to 48 hours             Gram Stain Shows: Gram positive cocci in clusters  12/26/22   Anaerobic Blood Culture No Growth  12/23/22   Growth of Staphylococcus (coagulase negative)               Possible skin contaminant, susceptibility testing not             performed without request unless additional blood cultures,             collected within 48 hours of this culture, become positive.             Contact the laboratory for further information  if necessary.        MRSA culture [id number] Collected: 12/21/22 0000    Order Status: Canceled Specimen: Culturette from Nasal Swab     MRSA culture [id number] Collected: 12/21/22 0000    Order Status: Canceled Specimen: Culturette from Throat               Cardiac Studies:  Echocardiogram: 12/21/22    * Limited echo to evaluate biventricular function.    * The left ventricle is moderately dilated with LVIDd 6.8 cm.    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    * Severe global hypokinesis with regional variation.    * The right ventricular cavity size is mildly dilated.    * Moderately decreased right ventricular systolic function.    * There is severe mitral regurgitation which is functional secondary to  dilated left ventricle.    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    * Compared to the prior study dated 09/08/22, the EF remains severely  reduced and there is more significant mitral and tricuspid regurgitation.      Right Heart Catheterization:   12/27/22 at bedside  CVP  20 mmHg  RV  55/18  PA  56/20 (34)  PCWP  24 with V 28  TC CO/CI 7.0/3.1  SvO2  66.9%  Fick CO/CI 7.3/3.3  PVR  <2      Imaging:  Radiology  Results (24 Hour)       Procedure Component Value Units Date/Time    XR Chest AP Portable [id number] Resulted: 12/31/22 0612    Order Status: Sent Updated: 12/31/22 0702    XR Chest AP Portable [id number] Collected: 12/30/22 1837    Order Status: Completed Updated: 12/30/22 1840    Narrative:      HISTORY: Pulmonary arterial catheter placement     COMPARISON: 12/27/2022    FINDINGS:   Left-sided pacer device in place. Right sided pulmonary arterial catheter  with tip projecting over the right hilum, unchanged. Stable cardiac  silhouette. No pulmonary edema. No pleural effusion or pneumothorax.      Impression:        Pulmonary arterial catheter with tip projecting over the right hilum.    Montine Circle, MD  12/30/2022 6:38 PM

## 2022-12-31 NOTE — Consults (Signed)
Advanced Heart Failure and Transplant Consult Note    [facility] [employer] (NEW NAME): '[facility] Advanced Heart Failure Inpatient Team'    Primary Advanced HF Cardiologist: Dr. 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 At this moment from a CLL standpoint, he is quite stable.  Right lower extremity DVT (5/23)   Type 2 diabetes with an A1c 5.9%  Stable COPD  Multiple hospital admissions for acute decompensated heart failure in 2023  DM2  Thrombocytopenia  Iron deficit anemia   Iron studies (3/6): Iron sat 10, Ferritin 57  S/p Iron repletion (iron sucrose 200 mg x5 from 3/6 to 3/10)  Depression   Consult to psych   Aspiration event  Aspirated on 12/22/22, passed swallow eval thereafter     Recommendations:       Cardiogenic shock  Preload: Continue with Bumex gtt at 3 mg/hr, defer to ICU team regarding transition to bolus intermittent IV regimen   Inotropes: off dobutamine, not needed for now  Afterload: continue hydralazine 100 mg TID and isordil 20 TID  MCS: currently not needed  Advanced therapies candidacy: he is not a candidate to OHTx due to age and LVAD due to multiple medical comorbidities including biventricular dysfunction, AKI on Stage 4 CKD, frailty, and debility.   Palliative care consult placed. Patient will likely  benefit from a hospice referral.   In case of deterioration the patient is not a candidate for MCS given lack of exit strategies.  Ischemic Cardiomyopathy, EF 11%  Diuresis as above, will need to transition to appropriate oral regimen, defer to CICU team  GDMT:  Beta Blockers: hold in the setting of shock  ACEi/ARB/ARNI: hold in the setting of CKD  MRA: hold in the setting of CKD  SGLT2i: hold in the setting of CKD    Advanced heart failure consult will sign off at this time. [facility] cardiology team to follow once downgraded from CICU.     I have examined this patient and have reviewed the notes, assessments and/or procedures performed by Dr. Maurine Minister. I concur with his documentation of this patient--with these modifications made directly to the note above.     This patient has a high probability of sudden clinically significant deterioration which requires the highest level of physician preparedness to intervene urgently. To summarize, the patient is critically ill due to cardiovascular system failure and we are providing the following life or organ supporting interventions and critical care services: cardiogenic shock that I managed/supervised and that required frequent physician assessments. I devoted my full attention in the ICU to the direct care of this patient for this period of time. I spent 32 minutes providing the critical care services noted and this time is exclusive of teaching, billable procedures, and not overlapping with any other  providers.     Timothey Dahlstrom S. Opal Sidles, MD, MSc, [facility], FAHA    Signed by: Pilar Grammes, MD     Patient has BMI=Body mass index is 25.77 kg/m.  Diagnosis: No additional diagnosis based on BMI criteria     Patient Active Problem List   Diagnosis    Type 2 diabetes mellitus with complication, with long-term current use of insulin    Heart attack    CAD (coronary artery disease)    Non-ischemic cardiomyopathy    Acute exacerbation of CHF (congestive heart failure)    PUD (peptic  ulcer disease)    Acute dyspnea    CHF (congestive heart failure)    Headaches due to old head injury    Essential hypertension    Syncope and collapse    AICD (automatic cardioverter/defibrillator) present    Cardiomyopathy    SOB (shortness of breath)    GERD (gastroesophageal reflux disease)    Hypertensive heart disease without heart failure    Heart failure, unspecified    Nonischemic cardiomyopathy    Pure hypercholesterolemia    Chronic systolic congestive heart failure    Encounter for implantable defibrillator reprogramming or check    Episode of recurrent major depressive disorder, unspecified depression episode severity    Stage 3 chronic kidney disease, 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    Anxiety and depression    COPD (chronic obstructive pulmonary disease)    HLD (hyperlipidemia)    Sleep apnea, obstructive    Scrotal edema    Hydrocele, unspecified hydrocele type    Acute on chronic congestive heart failure, unspecified heart failure type    Acute on chronic right-sided heart failure    Ischemic cardiomyopathy    CLL (chronic lymphocytic leukemia)    SVT (supraventricular tachycardia)    Heart failure    Iron deficiency anemia secondary to inadequate dietary iron intake    Heart failure, unspecified HF chronicity, unspecified heart failure type       Past Medical History:  Past Medical History:   Diagnosis Date    Acute CHF     NOV  & DEC 2018, - DIALYSIS CATH PLACED Aug 24 2017 TO REMOVE FLUID     Acute systolic (congestive) heart failure 08/2017    Anxiety     CAD (coronary artery disease)     Cardiomyopathy     nonischemic    CHF (congestive heart failure) 08/12/2014, 2013    Chronic obstructive pulmonary disease     POSSIBLE PER PT HE USES  SYNBICORT BID ABD  BREO INHALER PRN    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    Coronary artery disease 2011    CORONARY STENT PLACEMENT FOLLOWED BY [facility]    Depression     echocardiogram 02/2016, 11/2016, 09/2017, 12/20/2017    GERD  (gastroesophageal reflux disease)     Heart attack 2011    and 2014    Hyperlipemia     Hypertension     ICD (implantable 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     PLACED 2014  [facility]  LAST INTERROGATION April 01 2018 REPORT REQUESTED    Ischemic cardiomyopathy     EF 15% ON ECHO 09-20-2017 DR GARG IN EPIC    Nonischemic cardiomyopathy     NSTEMI (non-ST elevated myocardial infarction)     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    Pacemaker 2014    MEDTRONIC ICD/PACEMAKER COMBO LAST INTERROGATION  12-12 2018    Pneumonia 09/2016    Primary cardiomyopathy  Ischemic cardiomyopathy EF 15% ON ECHO 09-20-2017 DR GARG IN EPIC    Sepsis 08/2017    Syncope and collapse     PRIOR TO ICD/PACEMAKER PLACED 2014    Type 2 diabetes mellitus, controlled DX 1998    BS  AVG 100  A1C 7.4 Dec 27 2017    [place]     PT SEE HIS PMD April 01 2018 RE THIS-TOLD TO SEE PMD BY [facility] JUNE 12 WHEN THEY SAW HIM FOR A CL        Past Surgical History:  Past Surgical History:   Procedure Laterality Date    BIV  11/10/2016    ICD METRONIC  UPGRADE     CARDIAC CATHETERIZATION  03/2010    CORONARY STENT PLACEMENT X 2 PER PT; LM normal, LAD 70-80% distal lesion at apex, 20% lesion mid CFX    CARDIAC CATHETERIZATION  12/2012    CARDIAC DEFIBRILLATOR PLACEMENT  12/2012    [facility]    CARDIAC PACEMAKER PLACEMENT  2014    MEDTRONIC ICD/PACEMAKER PLACED  HEART    CIRCUMCISION  AGE 49    COLONOSCOPY  2009    CORRECTION HAMMER TOE      duodenal ulcer  1973    STRESS RELATED IN SCHOOL    ECHOCARDIOGRAM, TRANSTHORACIC  02/2016 11/2016 09/2017 12/2017    ECHOCARDIOGRAM, TRANSTHORACIC  02/2016,11/2016,12/20/2017    EGD N/A 08/15/2014    Procedure: EGD;  Surgeon: Loraine Maple, MD;  Location: [facility];  Service: Gastroenterology;  Laterality: N/A;  egd w/ bx    EGD  1975    EGD, COLONOSCOPY N/A 04/04/2018    Procedure: EGD with bxs, COLONOSCOPY with polypectomy and clipping;  Surgeon: Omer Jack, MD;  Location: [facility];  Service: Gastroenterology;   Laterality: N/A;    FRACTURE SURGERY  AGE 89     RT  ANKLE- CAST APPLIED    HERNIA REPAIR  AGE 49    LEFT INGUINAL HERNIA    MPI nuclear study  06/2016    orthopedic surgery  AGE 70    RT foot corrective - HAMMERTOE    RIGHT HEART CATH Right 09/09/2022    Procedure: Right Heart Cath;  Surgeon: Wandra Arthurs, MD;  Location: [facility];  Service: Cardiovascular;  Laterality: Right;  tx from [place] INCL. CO AND SATS Right 12/22/2022    Procedure: Right Heart Cath ONLY incl. CO and Sats;  Surgeon: Day, Lillette Boxer, MD;  Location: [facility];  Service: Cardiovascular;  Laterality: Right;  Right Heart Cath with leave in PA cath for milrinone initiation    [place]    TUNNELED CATH PLACEMENT (PERMCATH) N/A 12/25/2022    Procedure: [facility];  Surgeon: Estelle Grumbles, MD;  Location: [facility];  Service: Interventional Radiology;  Laterality: N/A;          Current Meds:  amiodarone, 200 mg, Oral, Daily  aspirin EC, 81 mg, Oral, Daily  atorvastatin, 40 mg, Oral, QHS  chlorothiazide, 1,000 mg, Intravenous, BID  heparin (porcine), 5,000 Units, Subcutaneous, Q8H SCH  hydrALAZINE, 100 mg, Oral, Q8H SCH  insulin glargine, 33 Units, Subcutaneous, Q12H   Or  insulin glargine, 15 Units, Subcutaneous, Q12H  insulin lispro, 1-6 Units, Subcutaneous, QHS  insulin lispro, 10 Units, Subcutaneous, TID AC  insulin lispro, 2-10 Units, Subcutaneous, TID AC  isosorbide dinitrate, 40 mg, Oral, TID - Nitrate Free Interval  niacin, 500 mg, Oral, BID  polyethylene glycol, 17 g, Oral, Daily  senna-docusate, 1 tablet, Oral, Q12H [name]        Drips:   bumetanide 3 mg/hr (12/31/22 1132)       Home Meds:  Home Medications               apixaban (ELIQUIS) 5 MG     Take 1 tablet (5 mg) by mouth every 12 (twelve) hours For 1 week followed by 1 tablet twice daily     aspirin EC 81 MG EC tablet     Take 1 tablet (81 mg) by mouth daily     atorvastatin (LIPITOR) 40 MG tablet     Take 1 tablet (40 mg  total) by mouth nightly     Notes:  PLEASE ADVISE PATIENT TO SCHEDULE OVERDUE FOLLOW-UP WITH GENERAL CARDIOLOGY (DR. Camille Bal) FOR CONTINUED MANAGEMENT/REFILLS.     Calquence 100 MG Tab          hydrALAZINE (APRESOLINE) 25 MG tablet (Expired)     Take 1 tablet (25 mg) by mouth every 8 (eight) hours     Insulin Glargine-yfgn (Semglee, yfgn,) 100 UNIT/ML Solution Pen-injector     50 Units by Subdermal route daily     isosorbide dinitrate (ISORDIL) 10 MG tablet (Expired)     Take 1 tablet (10 mg) by mouth 3 (three) times daily     Jardiance 10 MG tablet     TAKE 1 TABLET(10 MG) BY MOUTH DAILY     loratadine (CLARITIN) 10 MG tablet     Take 1 tablet (10 mg) by mouth daily     metoprolol succinate XL (TOPROL-XL) 25 MG 24 hr tablet     Take 0.5 tablets (12.5 mg) by mouth daily     niacin (NIASPAN) 500 MG CR tablet     TAKE 1 TABLET BY MOUTH TWICE DAILY     Patient taking differently: 250 mg Taking half a tablet     torsemide (DEMADEX) 20 MG tablet     Take 2 tablets (40 mg) by mouth 2 (two) times daily               Allergies:  Allergies   Allergen Reactions    Allopurinol        Family History:  Family History   Problem Relation Age of Onset    Breast cancer Mother     Heart attack Mother 49    Hypertension Mother     Diabetes Mother     Coronary artery disease Mother     Diabetes Brother     Heart attack Father     Hypertension Father        Social History:  Social History     Socioeconomic History    Marital status: Married     Spouse name: Architect    Number of children: 0   Occupational History    Occupation: Therapist, [place], [place]    Tobacco Use    Smoking status: Never    Smokeless tobacco: Never   Vaping Use    Vaping Use: Never used   Substance and Sexual Activity    Alcohol use: Not Currently    Drug use: No    Sexual activity: Yes     Partners: Female     Social Determinants of Health     Financial Resource Strain: Low Risk  (12/22/2022)    Overall Financial Resource Strain (CARDIA)     Difficulty of  Paying  Living Expenses: Not hard at all   Food Insecurity: No Food Insecurity (09/12/2022)    Hunger Vital Sign     Worried About Running Out of Food in the Last Year: Never true     [place] in the Last Year: Never true   Transportation Needs: No Transportation Needs (12/22/2022)    PRAPARE - Armed forces logistics/support/administrative officer (Medical): No     Lack of Transportation (Non-Medical): No   Intimate Partner Violence: Not At Risk (09/12/2022)    Humiliation, Afraid, Rape, and Kick questionnaire     Fear of Current or Ex-Partner: No     Emotionally Abused: No     Physically Abused: No     Sexually Abused: No   Housing Stability: Low Risk  (12/22/2022)    Housing Stability Vital Sign     Unable to Pay for Housing in the Last Year: No     Number of Places Lived in the Last Year: 1     Unstable Housing in the Last Year: No          Objective:   Vital Signs in last 24 hours:  Temp:  [97.6 F (36.4 C)-98.3 F (36.8 C)] 97.8 F (36.6 C)  Heart Rate:  [77-90] 77  Resp Rate:  [22-81] 77  BP: (105-142)/(58-63) 105/59  Arterial Line BP: (109-139)/(47-60) 118/51  SpO2: 97 %  O2 Device: None (Room air)    Vent Settings  FiO2: (S) 40 %    Telemetry: frequent PVCs and couplets.     Hemodynamics:  PAP: 72/28  PAP (Mean): 44 mmHg  CVP (mmHg): 18 mmHg     CO (L/min): 7 L/min  CI (L/min/m2): 3.2 L/min/m2  Cardiac Output (FICK): 9.58  Cardiac Index (FICK): 4.33  SVR (dyne*sec)/cm5: 619 (dyne*sec)/cm5  SV (mL): 84.3 mL      Intake / Output:    Intake/Output Summary (Last 24 hours) at 12/31/2022 1403  Last data filed at 12/31/2022 1300  Gross per 24 hour   Intake 1006 ml   Output 6075 ml   Net -5069 ml            Weight:      12/30/2022    10:53 AM 12/30/2022     4:21 PM 12/31/2022     5:56 AM   Weight Monitoring   Height 182.9 cm 182.9 cm    Weight   86.2 kg   BMI (calculated)   25.8 kg/m2        Weight change: -7.15 kg (-15 lb 12.2 oz)       Physical Exam:   General: well-developed, alert, NAD  HEENT: conjunctiva pale, buccal mucosa  moist, anicteric sclera, no erythema, JVP ~ 12 cm H2O, no carotid bruits, carotid pulses are present bilaterally  Lungs: CTA B/L, no rhonchi, wheezes or crackles with normal rate and effort  Cardiovascular: S1, S2, normal rate, regular rhythm, holosystolic murmur  Abdominal: soft, NT/ND, +B.S., no HSM  Extremities: warm to touch, no cyanosis, no edema, peripheral pulses present bilaterally  Neuromuscular exam: alert and oriented x 4, speech fluent     LDAs:  Patient Lines/Drains/Airways Status       Active Lines, Drains and Airways       Name Placement date Placement time Site Days    Implanted Catheter 12/25/22 [place] Tunneled Right Internal jugular 12/25/22  1500  Internal jugular  5    Peripheral IV 12/21/22 18 G Left Antecubital 12/21/22  2200  Antecubital  9    Peripheral IV 12/26/22 22 G Anterior;Left Forearm 12/26/22  1143  Forearm  5    External Urinary Catheter 12/24/22  2000  --  6                      Labs:  CBC        12/31/22  0357 12/30/22  0408 12/29/22  0345   WBC 9.55* 8.46 7.51   Hgb 9.3* 8.7* 9.0*   Hematocrit 27.6* 26.5* 27.3*   Platelets 113* 85* 78*         BMP        12/31/22  1023 12/31/22  0357 12/30/22  2148 12/30/22  1621 12/30/22  1131 12/30/22  1053 12/30/22  0408 12/26/22  1705 12/26/22  1141   BUN 80.0* 84.0* 79.0* 79.0*   < >  --  79.0*   < >  --    Creatinine 4.1* 4.2* 4.3* 4.5*   < >  --  4.7*   < >  --    eGFR 14.3* 13.9* 13.5* 12.8*   < >  --  12.2*   < >  --    Sodium 133* 134* 134* 133*   < >  --  134*   < >  --    Potassium 4.3 3.8 3.9 4.1   < >  --  4.0   < >  --    Chloride 94* 95* 95* 95*   < >  --  97*   < >  --    CO2 '25 22 25 25   '$ < >  --  24   < >  --    Calcium 9.7 9.6 9.3 9.1   < >  --  8.8   < >  --    Magnesium 2.2 2.2 2.3 2.3   < >  --  2.4   < >  --    Lactic Acid  --   --   --  0.9  --  1.1 0.7   < > 2.3*   NT-proBNP  --   --   --   --   --   --   --   --  9,403*    < > = values in this interval not displayed.         LFTs        12/31/22  0357 12/30/22  0408  12/29/22  0345   AST (SGOT) '23 26 28   '$ ALT 51 62* 82*   Alkaline Phosphatase 65 64 62   Bilirubin, Total 1.7* 1.5* 1.8*   Bilirubin Direct 0.8* 0.8* 0.7*   Bilirubin Indirect 0.9 0.7 1.1*         Coags        12/27/22  0410 12/26/22  2024 12/26/22  0409   Anti-Xa, UFH 0.09 0.46 <0.04         Microbiology  Microbiology Results (last 15 days)       Procedure Component Value Units Date/Time    MRSA culture [id number] Collected: 12/23/22 1200    Order Status: Completed Specimen: Culturette from Nasal Swab Updated: 12/24/22 1311     Culture MRSA Surveillance Negative for Methicillin Resistant Staph aureus    MRSA culture [id number] Collected: 12/23/22 1200    Order Status: Completed Specimen: Culturette from Throat Updated: 12/24/22 1311     Culture MRSA Surveillance Negative for Methicillin Resistant Staph aureus  Culture Blood Aerobic and Anaerobic [id number] Collected: 12/22/22 2239    Order Status: Completed Specimen: Blood, Venipuncture Updated: 12/28/22 0425    Narrative:      The order will result in two separate 8-15m bottles  Please do NOT order repeat blood cultures if one has been  drawn within the last 48 hours  UNLESS concerned for  endocarditis  AVOID BLOOD CULTURE DRAWS FROM CENTRAL LINE IF POSSIBLE  Indications:->Pneumonia  ORDER#: [id number]                                   ORDERED BY: GVear Clock SOURCE: Blood, Venipuncture                          COLLECTED:  12/22/22 22:39  ANTIBIOTICS AT COLL.:                                RECEIVED :  12/23/22 02:00  Culture Blood Aerobic and Anaerobic        FINAL       12/28/22 04:25  12/28/22   No growth after 5 days of incubation.      Culture Blood Aerobic and Anaerobic [[id number]: 12/22/22 2239    Order Status: Completed Specimen: Blood, Venipuncture Updated: 12/28/22 0425    Narrative:      The order will result in two separate 8-175mbottles  Please do NOT order repeat blood cultures if one has been  drawn within the last 48 hours   UNLESS concerned for  endocarditis  AVOID BLOOD CULTURE DRAWS FROM CENTRAL LINE IF POSSIBLE  Indications:->Pneumonia  ORDER#: [id number]                                  ORDERED BY: GAVear ClockSOURCE: Blood, Venipuncture                          COLLECTED:  12/22/22 22:39  ANTIBIOTICS AT COLL.:                                RECEIVED :  12/23/22 01:59  Culture Blood Aerobic and Anaerobic        FINAL       12/28/22 04:25  12/28/22   No growth after 5 days of incubation.      Respiratory Pathogen Panel w/COVID-19, PCR [[id number]   Order Status: Canceled     COVID-19 (SARS-CoV-2) and Influenza A/B, NAA (Liat Rapid)- Admission [[id number]: 12/21/22 1050    Order Status: Completed Specimen: Culturette from Nasopharyngeal Updated: 12/21/22 1144     Purpose of COVID testing Diagnostic -[facility]I     SARS-CoV-2 Specimen Source Nasal Swab     SARS CoV 2 Overall Result Not Detected     Comment: __________________________________________________  -A result of "Detected" indicates POSITIVE for the    presence of SARS CoV-2 RNA  -A result of "Not Detected" indicates NEGATIVE for the    presence of SARS CoV-2 RNA  __________________________________________________________  Test performed using the Roche cobas Liat SARS-CoV-2 assay. This assay is  only for use under the Food and Drug Administration's Emergency Use  Authorization. This is a real-time RT-PCR assay for the qualitative  detection of SARS-CoV-2 RNA. Viral nucleic acids may persist in vivo,  independent of viability. Detection of viral nucleic acid does not imply the  presence of infectious virus, or that virus nucleic acid is the cause of  clinical symptoms. Negative results do not preclude SARS-CoV-2 infection and  should not be used as the sole basis for diagnosis, treatment or other  patient management decisions. Negative results must be combined with  clinical observations, patient history, and/or epidemiological information.  Invalid results may be  due to inhibiting substances in the specimen and  recollection should occur. Please see Fact Sheets for patients and providers  located:  https://www.benson-chung.com/          Influenza A Not Detected     Influenza B Not Detected     Comment: Test performed using the Roche cobas Liat SARS-CoV-2 & Influenza A/B assay.  This assay is only for use under the Food and Drug Administration's  Emergency Use Authorization. This is a multiplex real-time RT-PCR assay  intended for the simultaneous in vitro qualitative detection and  differentiation of SARS-CoV-2, influenza A, and influenza B virus RNA. Viral  nucleic acids may persist in vivo, independent of viability. Detection of  viral nucleic acid does not imply the presence of infectious virus, or that  virus nucleic acid is the cause of clinical symptoms. Negative results do  not preclude SARS-CoV-2, influenza A, and/or influenza B infection and  should not be used as the sole basis for diagnosis, treatment or other  patient management decisions. Negative results must be combined with  clinical observations, patient history, and/or epidemiological information.  Invalid results may be due to inhibiting substances in the specimen and  recollection should occur. Please see Fact Sheets for patients and providers  located: http://olson-hall.info/.         Narrative:      o Collect and clearly label specimen type:  o PREFERRED-Upper respiratory specimen: One Nasal Swab in  Transport Media.  o Hand deliver to laboratory ASAP  Diagnostic -[facility]    Respiratory Pathogen Panel w/COVID-19, PCR [id number] Collected: 12/21/22 0106    Order Status: Canceled Specimen: Nasopharyngeal     Culture Blood Aerobic and Anaerobic [id number] Collected: 12/21/22 0021    Order Status: Completed Specimen: Blood, Venipuncture Updated: 12/26/22 [id number]    Narrative:      Indications:->Other  Other->infection  ORDER#: [id number]                                    ORDERED  BY: LEE, SIEW MEI  SOURCE: Blood, Venipuncture RAC                      COLLECTED:  12/21/22 00:21  ANTIBIOTICS AT COLL.:                                RECEIVED :  12/21/22 04:06  Culture Blood Aerobic and Anaerobic        FINAL       12/26/22 06:37  12/26/22   No growth after 5 days of incubation.      Culture Blood Aerobic and Anaerobic [id number] Collected: 12/21/22 0021    Order Status: Completed Specimen: Blood, Venipuncture Updated: 12/26/22 1147    Narrative:      [id number] called  Micro Results of Pos Bld. Results read back by:[id number], by [id number]  on 12/22/2022 at 07:29  Indications:->Other  Other->infection  ORDER#: [id number]                                    ORDERED BY: LEE, SIEW MEI  SOURCE: Blood, Venipuncture LAC                      COLLECTED:  12/21/22 00:21  ANTIBIOTICS AT COLL.:                                RECEIVED :  12/21/22 04:06  [id number] called Micro Results of Pos Bld. Results read back by:[id number], by [phone] on 12/22/2022 at 07:29  Culture Blood Aerobic and Anaerobic        FINAL       12/26/22 11:47   +  12/22/22   Aerobic Blood Culture Positive in 24 to 48 hours             Gram Stain Shows: Gram positive cocci in clusters  12/26/22   Anaerobic Blood Culture No Growth  12/23/22   Growth of Staphylococcus (coagulase negative)               Possible skin contaminant, susceptibility testing not             performed without request unless additional blood cultures,             collected within 48 hours of this culture, become positive.             Contact the laboratory for further information if necessary.        MRSA culture [id number] Collected: 12/21/22 0000    Order Status: Canceled Specimen: Culturette from Nasal Swab     MRSA culture [id number] Collected: 12/21/22 0000    Order Status: Canceled Specimen: Culturette from Throat               Cardiac Studies:  Echocardiogram: 12/21/22  Summary    * Limited echo to evaluate biventricular function.    * The left ventricle is moderately dilated with  LVIDd 6.8 cm.    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    * Severe global hypokinesis with regional variation.    * The right ventricular cavity size is mildly dilated.    * Moderately decreased right ventricular systolic function.    * There is severe mitral regurgitation which is functional secondary to  dilated left ventricle.    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    * Compared to the prior study dated 09/08/22, the EF remains severely  reduced and there is more significant mitral and tricuspid regurgitation.    Right Heart Catheterization: 12/22/22  RA 15/15/11  RV 48/1/14  PA 48/21/28  PCWP 17/18/17  RA sat 58.9 (pulsox 99%  FickCO 4.2 L/min  FickCI 2.0 L/min/m2  ThermCO 2.8 L/min  ThermCI 1.3 L/min/m2  PVR 2.1 wu (Fick) 3.1 wu (Therm)    Imaging:  Radiology Results (24 Hour)       Procedure Component Value Units Date/Time    XR Chest AP Portable [  [id number] Collected: 12/31/22 0945    Order Status: Completed Updated: 12/31/22 0949    Narrative:      HISTORY: Pulmonary arterial catheter placement     COMPARISON: 12/30/2022    FINDINGS:   Right sided central venous catheter with tip in the SVC. Left-sided pacer  device in place. Cardiomegaly. No pulmonary edema. No pleural effusion or  pneumothorax. No pulmonary arterial catheter identified.      Impression:        Cardiomegaly. No pulmonary arterial catheter identified.    Montine Circle, MD  12/31/2022 9:47 AM    XR Chest AP Portable [id number] Collected: 12/30/22 1837    Order Status: Completed Updated: 12/30/22 1840    Narrative:      HISTORY: Pulmonary arterial catheter placement     COMPARISON: 12/27/2022    FINDINGS:   Left-sided pacer device in place. Right sided pulmonary arterial catheter  with tip projecting over the right hilum, unchanged. Stable  cardiac  silhouette. No pulmonary edema. No pleural effusion or pneumothorax.      Impression:        Pulmonary arterial catheter with tip projecting over the right hilum.    Montine Circle, MD  12/30/2022 6:38 PM

## 2022-12-31 NOTE — Progress Notes (Signed)
@  2155 pt. Transferred to bed 370 via wheelchair in stable condition vss, belongings sent with the patient, report given to [facility],

## 2022-12-31 NOTE — Transfer Summary (Signed)
ICU to Ward Transfer Summary [name] [name]     I   ICU Admission Reason & Brief ICU Course:   [name] [name] a 77 y.o. male with PMHx NICM/ICM s/p Medtronik ICD, CAD s/p PCI with DES, T2DM, stage IV CLL, CKD3b, RLE DVT on home Eliquis admitted to [facility] for SVT and transferred to [place] for LVAD evaluation where diuresis and cardiac medications were optimized, and pt was downgraded to the medicine floor on 3/8. Subsequent readmission to CICU on 3/10 for worsening renal function and volume overload. Now off dobutamine as of 3/13 with swan removed and on stable dose of Bumex gtt and Diuril. Evaluated for LVAD and pt not a candidate given poor renal function.          C  Code Status: Full Code  Emergency Contact:  Primary Emergency Contact: Denny,Octavia  ACP Note: Open ACP Navigator     U Unprescribing & Pertinent High-Risk Medications:    Current Anticoagulants (blank if none): heparin (porcine) injection 5,000 Units   Current Antibiotics (blank if none):  Antibiotics (From admission, onward)      None            Home Medications Current Medications   Home Medications               apixaban (ELIQUIS) 5 MG     Take 1 tablet (5 mg) by mouth every 12 (twelve) hours For 1 week followed by 1 tablet twice daily     aspirin EC 81 MG EC tablet     Take 1 tablet (81 mg) by mouth daily     atorvastatin (LIPITOR) 40 MG tablet     Take 1 tablet (40 mg total) by mouth nightly     Notes:  PLEASE ADVISE PATIENT TO SCHEDULE OVERDUE FOLLOW-UP WITH GENERAL CARDIOLOGY (DR. Camille Bal) FOR CONTINUED MANAGEMENT/REFILLS.     Calquence 100 MG Tab          hydrALAZINE (APRESOLINE) 25 MG tablet (Expired)     Take 1 tablet (25 mg) by mouth every 8 (eight) hours     Insulin Glargine-yfgn (Semglee, yfgn,) 100 UNIT/ML Solution Pen-injector     50 Units by Subdermal route daily     isosorbide dinitrate (ISORDIL) 10 MG tablet (Expired)     Take 1 tablet (10 mg) by mouth 3 (three) times daily     Jardiance 10 MG tablet     TAKE 1  TABLET(10 MG) BY MOUTH DAILY     loratadine (CLARITIN) 10 MG tablet     Take 1 tablet (10 mg) by mouth daily     metoprolol succinate XL (TOPROL-XL) 25 MG 24 hr tablet     Take 0.5 tablets (12.5 mg) by mouth daily     niacin (NIASPAN) 500 MG CR tablet     TAKE 1 TABLET BY MOUTH TWICE DAILY     Patient taking differently: 250 mg Taking half a tablet     torsemide (DEMADEX) 20 MG tablet     Take 2 tablets (40 mg) by mouth 2 (two) times daily         Current Facility-Administered Medications   Medication Dose Route Frequency    amiodarone  200 mg Oral Daily    aspirin EC  81 mg Oral Daily    atorvastatin  40 mg Oral QHS    chlorothiazide  1,000 mg Intravenous BID    heparin (porcine)  5,000 Units Subcutaneous Q8H [place]    hydrALAZINE  100 mg Oral Q8H [place]    insulin glargine  33 Units Subcutaneous Q12H    Or    insulin glargine  15 Units Subcutaneous Q12H    insulin lispro  1-6 Units Subcutaneous QHS    insulin lispro  10 Units Subcutaneous TID AC    insulin lispro  2-10 Units Subcutaneous TID AC    isosorbide dinitrate  40 mg Oral TID - Nitrate Free Interval    niacin  500 mg Oral BID    polyethylene glycol  17 g Oral Daily    senna-docusate  1 tablet Oral Q12H [place]             P Pending Tests at the Time of Transfer:  Unresulted Labs       Procedure . . . Date/Time    Basic Metabolic Panel [id number] Collected: 12/22/22 1504    Specimen: Blood Updated: 12/22/22 1505    Magnesium [id number] Collected: 12/22/22 1504    Specimen: Blood Updated: 12/22/22 1505             A Active consultants, including Rehab:  Subspecialty Consultants: Cardiology  Current PT Consultation Order: Yes  Current OT Consultation Order: Yes      U Uncertainty Measure/Diagnostic Pause:   Working diagnosis at the time of transfer Heart failure  Differential Diagnosis includes Stage D NICM/ICM Heart failure with reduced EF   Clinical Certainty of Diagnosis: High degree of certainty about the clinical diagnosis.      S Summary of Major Problems and  To-Dos:  #Heart failure  #AKI on CKD3b  #Shock liver, improving  #Thrombocytopenia  Principal Problem:    Heart failure  Active Problems:    Non-ischemic cardiomyopathy    Nonischemic cardiomyopathy    Chronic systolic congestive heart failure    Acute on chronic right-sided heart failure    Ischemic cardiomyopathy    Iron deficiency anemia secondary to inadequate dietary iron intake    Heart failure, unspecified HF chronicity, unspecified heart failure type    Items for follow-up // To-Do:  F/u nephro recommendations if planning to do outpatient dialysis   If plan to do outpatient dialysis, will need IR contacted for tunneled line     E Exam at the time of transfer, including Lines/Drains/Airways & Data Review:    General Appearance:  alert, well appearing, and in no distress and oriented to person, place, and time  Mental status:  alert, oriented to person, place, and time  Neuro:  alert, oriented, normal speech, no focal findings or movement disorder noted  Lungs: clear to auscultation, no wheezes, rales or rhonchi, symmetric air entry  Cardiac: normal rate and regular rhythm, S1 and S2 normal  Abdomen:  soft, nontender, nondistended, no masses or organomegaly  Extremities: peripheral pulses normal, no pedal edema, no clubbing or cyanosis   Skin:   normal coloration and turgor, no rashes, no suspicious skin lesions noted  Other:    Difficult airway? N/A    Hospital Acquired Infection Reduction:  Current Central Catheters (n/a if blank):   Implanted Catheter 12/25/22 Hohn Tunneled Right Internal jugular    Current Urinary Catheters (n/a if blank):  Foley Day:              Vanessa Ralphs., DO  12/31/2022 2:47 PM

## 2023-01-01 DIAGNOSIS — I255 Ischemic cardiomyopathy: Secondary | ICD-10-CM

## 2023-01-01 LAB — CBC AND DIFFERENTIAL
Absolute NRBC: 0 10*3/uL (ref 0.00–0.00)
Basophils Absolute Automated: 0.01 10*3/uL (ref 0.00–0.08)
Basophils Automated: 0.1 %
Eosinophils Absolute Automated: 0.13 10*3/uL (ref 0.00–0.44)
Eosinophils Automated: 1.3 %
Hematocrit: 30.2 % — ABNORMAL LOW (ref 37.6–49.6)
Hgb: 9.9 g/dL — ABNORMAL LOW (ref 12.5–17.1)
Immature Granulocytes Absolute: 0.04 10*3/uL (ref 0.00–0.07)
Immature Granulocytes: 0.4 %
Instrument Absolute Neutrophil Count: 5.43 10*3/uL (ref 1.10–6.33)
Lymphocytes Absolute Automated: 3.38 10*3/uL — ABNORMAL HIGH (ref 0.42–3.22)
Lymphocytes Automated: 32.9 %
MCH: 32.8 pg (ref 25.1–33.5)
MCHC: 32.8 g/dL (ref 31.5–35.8)
MCV: 100 fL — ABNORMAL HIGH (ref 78.0–96.0)
MPV: 11.6 fL (ref 8.9–12.5)
Monocytes Absolute Automated: 1.27 10*3/uL — ABNORMAL HIGH (ref 0.21–0.85)
Monocytes: 12.4 %
Neutrophils Absolute: 5.43 10*3/uL (ref 1.10–6.33)
Neutrophils: 52.9 %
Nucleated RBC: 0 /100 WBC (ref 0.0–0.0)
Platelets: 125 10*3/uL — ABNORMAL LOW (ref 142–346)
RBC: 3.02 10*6/uL — ABNORMAL LOW (ref 4.20–5.90)
RDW: 17 % — ABNORMAL HIGH (ref 11–15)
WBC: 10.26 10*3/uL — ABNORMAL HIGH (ref 3.10–9.50)

## 2023-01-01 LAB — HEPATIC FUNCTION PANEL (LFT)
ALT: 43 U/L (ref 0–55)
AST (SGOT): 26 U/L (ref 5–41)
Albumin/Globulin Ratio: 0.9 (ref 0.9–2.2)
Albumin: 3.4 g/dL — ABNORMAL LOW (ref 3.5–5.0)
Alkaline Phosphatase: 72 U/L (ref 37–117)
Bilirubin Direct: 0.6 mg/dL — ABNORMAL HIGH (ref 0.0–0.5)
Bilirubin Indirect: 1 mg/dL (ref 0.2–1.0)
Bilirubin, Total: 1.6 mg/dL — ABNORMAL HIGH (ref 0.2–1.2)
Globulin: 3.6 g/dL (ref 2.0–3.6)
Protein, Total: 7 g/dL (ref 6.0–8.3)

## 2023-01-01 LAB — MAGNESIUM
Magnesium: 2.3 mg/dL (ref 1.6–2.6)
Magnesium: 2.3 mg/dL (ref 1.6–2.6)

## 2023-01-01 LAB — BASIC METABOLIC PANEL
Anion Gap: 15 (ref 5.0–15.0)
Anion Gap: 16 — ABNORMAL HIGH (ref 5.0–15.0)
BUN: 83 mg/dL — ABNORMAL HIGH (ref 9.0–28.0)
BUN: 99 mg/dL — ABNORMAL HIGH (ref 9.0–28.0)
CO2: 24 mEq/L (ref 17–29)
CO2: 26 mEq/L (ref 17–29)
Calcium: 9.5 mg/dL (ref 7.9–10.2)
Calcium: 9.8 mg/dL (ref 7.9–10.2)
Chloride: 94 mEq/L — ABNORMAL LOW (ref 99–111)
Chloride: 91 mEq/L — ABNORMAL LOW (ref 99–111)
Creatinine: 4.2 mg/dL — ABNORMAL HIGH (ref 0.5–1.5)
Creatinine: 4.1 mg/dL — ABNORMAL HIGH (ref 0.5–1.5)
Glucose: 159 mg/dL — ABNORMAL HIGH (ref 70–100)
Glucose: 229 mg/dL — ABNORMAL HIGH (ref 70–100)
Potassium: 4.3 mEq/L (ref 3.5–5.3)
Potassium: 4.6 mEq/L (ref 3.5–5.3)
Sodium: 133 mEq/L — ABNORMAL LOW (ref 135–145)
Sodium: 133 mEq/L — ABNORMAL LOW (ref 135–145)
eGFR: 13.9 mL/min/{1.73_m2} — AB (ref >=60)
eGFR: 14.3 mL/min/{1.73_m2} — AB (ref >=60)

## 2023-01-01 LAB — WHOLE BLOOD GLUCOSE POCT
Whole Blood Glucose POCT: 295 mg/dL — ABNORMAL HIGH (ref 70–100)
Whole Blood Glucose POCT: 196 mg/dL — ABNORMAL HIGH (ref 70–100)
Whole Blood Glucose POCT: 122 mg/dL — ABNORMAL HIGH (ref 70–100)
Whole Blood Glucose POCT: 241 mg/dL — ABNORMAL HIGH (ref 70–100)

## 2023-01-01 MED ORDER — TORSEMIDE 20 MG PO TABS
80.0000 mg | ORAL_TABLET | Freq: Two times a day (BID) | ORAL | Status: DC
Start: 2023-01-01 — End: 2023-01-04
  Administered 2023-01-01 – 2023-01-04 (×7): 80 mg via ORAL
  Filled 2023-01-01 (×7): qty 4

## 2023-01-01 NOTE — Progress Notes (Addendum)
MEDICINE PROGRESS NOTE    Date Time: 01/01/23 7:37 AM  Patient Name: [name] [name]  Attending Physician: Gillis Santa, MD    Assessment:   77 y.o. male with PMHx NICM/ICM s/p Medtronik ICD, CAD s/p PCI with DES, T2DM, stage IV CLL, CKD3b, RLE DVT on home Eliquis admitted to [facility] for SVT and transferred to [facility] CICU for LVAD evaluation where diuresis and cardiac medications were optimized, and pt was downgraded to the medicine floor on 3/8. Subsequent readmission to CICU on 3/10 for worsening renal function and volume overload. Now off dobutamine as of 3/13 with swan removed and on stable dose of Bumex gtt and Diuril. Evaluated for LVAD and pt not a candidate given poor renal function. Pt now on the hospitalist team.    Plan:   # acute decompensated systolic CHF with Cardiogenic shock /Ischemic cardiomyopathy  EF 11%, NYHA Class 4, S/p Medtronic BiV ICD, h/o MR, TR  - Bumex gtt held. I discussed with nephrology today- Dr. Markus Daft. He recommended restarting his torsemide 80mg  po BID.  - follow up on BMP in am. May not need HD/RRT  - not on oxygen supplementation  - hemodynamically stable now- off dobutamine gtt  - not a candidate for LVAD with his poor renal function    # Narrow complex supraventricular tachycardia, s/p unsuccessful cardioversion 3/4  - amiodarone  - cardiac monitoring    #  Coronary artery disease s/p PCI/DES in 2011 with 3 stents and then 2 stents when he was in [place]    # Hyperlipidemia  - statin, niacin    # Hypertension  -improved.  - Chlorthiazide, hydralazine    #  AKI on CKD stage IV with baseline creatinine 1.6 as of 03/2022  - follow up with nephrology, see above  - I discussed with nephrology today- Dr. Markus Daft. He recommended restarting his torsemide 80mg  po BID.  - follow up on BMP    #  Stage IV CLL, diagnosed in September 2021  - 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  -Seen by oncology (12/24/2022): Hold the Calquence as it does have an anticoagulant effect and may contribute to arrhythmia.    - stable, needs outpatient follow up.    # Right lower extremity DVT (5/23)   - on home eliquis    #  Type 2 diabetes with an A1c 5.9%  - continue with lantus, lispro    #  Stable COPD    #  Thrombocytopenia  - improved to 125K today    #  Iron deficit anemia   - Iron studies (3/6): Iron sat 10, Ferritin 57  - S/p Iron repletion (iron sucrose 200 mg x5 from 3/6 to 3/10)    # Depression   -Consult to psych     # Aspiration event  -Aspirated on 12/22/22, passed swallow eval thereafter     # [place]  -Multiple hospital admissions for acute decompensated heart failure in 2023  - palliative care consulted as pt seems to be hospice appropriate, but pt is declining hospice.       Anticipate his [place] home with [facility] in next 24-48hrs if he remains stable and renal function is stable.       Additional Diagnoses:             Safety Checklist:     DVT prophylaxis:  CHEST guideline (See page [id number]) Chemical   Foley:  [place] Rn Foley protocol Not present   IVs:  Peripheral IV   PT/OT: Ordered  Daily CBC & or Chem ordered:  SHM/ABIM guidelines (see #5) Yes, due to clinical and lab instability   Reference for approximate charges of common labs: CBC auto diff - $76  BMP - $99  Mg - $79    Lines:     Patient Lines/Drains/Airways Status       Active PICC Line / CVC Line / PIV Line / Drain / Airway / Intraosseous Line / Epidural Line / ART Line / Line / Wound / Pressure Ulcer / NG/OG Tube       Name Placement date Placement time Site Days    Implanted Catheter 12/25/22 Hohn Tunneled Right Internal jugular 12/25/22  1500  Internal jugular  6    Peripheral IV 12/21/22 18 G Left Antecubital 12/21/22  2200  Antecubital  10    Peripheral IV 12/26/22 22 G Anterior;Left Forearm 12/26/22  1143  Forearm  5    External Urinary Catheter 12/24/22  2000  --  7                     Disposition: (Please see PAF column for Expected D/C Date)   Today's date: 01/01/2023  Admit Date: 12/21/2022  9:55 PM  LOS: 11  Clinical Milestones: stability of labs    Anticipated discharge needs: HH      Subjective     CC: Heart failure    Interval History/24 hour events: doing much better. SOB is better. Gets upset when I talk to him about his poor cardiac function. Keeps on saying that I am doing quite well.    No chest pains/palpitations. Not on oxygen supplementation.         Review of Systems:     As per HPI    Physical Exam:     VITAL SIGNS PHYSICAL EXAM   Temp:  [97.3 F (36.3 C)-98.1 F (36.7 C)] 97.3 F (36.3 C)  Heart Rate:  [72-88] 72  Resp Rate:  [17-80] 17  BP: (104-123)/(58-69) 120/69  Arterial Line BP: (118-126)/(51-53) 118/51  Blood Glucose:    Telemetry:  V paced      Intake/Output Summary (Last 24 hours) at 01/01/2023 0737  Last data filed at 01/01/2023 0500  Gross per 24 hour   Intake 1428 ml   Output 4950 ml   Net -3522 ml    Physical Exam  General: awake, alert X 4  Cardiovascular: regular rate and rhythm, holosystolic murmurs, rubs or gallops  Lungs: clear to auscultation bilaterally, without wheezing, rhonchi, or rales  Abdomen: soft, non-tender, non-distended; no palpable masses,  normoactive bowel sounds  Extremities: edema 1 +       Meds:     Medications were reviewed:  Current Facility-Administered Medications   Medication Dose Route Frequency    amiodarone  200 mg Oral Daily    aspirin EC  81 mg Oral Daily    atorvastatin  40 mg Oral QHS    chlorothiazide  1,000 mg Intravenous BID    heparin (porcine)  5,000 Units Subcutaneous Q8H [place]    hydrALAZINE  100 mg Oral Q8H [place]    insulin glargine  33 Units Subcutaneous Q12H    Or    insulin glargine  15 Units Subcutaneous Q12H    insulin lispro  1-6 Units Subcutaneous QHS    insulin lispro  10 Units Subcutaneous TID AC    insulin lispro  2-10 Units Subcutaneous TID AC    isosorbide dinitrate  40 mg Oral TID - Nitrate  Free Interval    niacin  500 mg Oral BID    polyethylene glycol  17 g Oral Daily    senna-docusate  1 tablet Oral Q12H [place]     Current Facility-Administered Medications   Medication Dose Route  Frequency Last Rate     Current Facility-Administered Medications   Medication Dose Route    acetaminophen  650 mg Oral    dextrose  15 g of glucose Oral    Or    dextrose  12.5 g Intravenous    Or    dextrose  12.5 g Intravenous    Or    glucagon (rDNA)  1 mg Intramuscular    melatonin  6 mg Oral    phenol  1 spray Oral    simethicone  80 mg Oral         Labs:     Labs (last 72 hours):    Recent Labs   Lab 01/01/23  0421 12/31/22  0357   WBC 10.26* 9.55*   Hgb 9.9* 9.3*   Hematocrit 30.2* 27.6*   Platelets 125* 113*          Recent Labs   Lab 01/01/23  0421 12/31/22  1734 12/31/22  1023 12/31/22  0357   Sodium 133* 132*  More results in Results Review 134*   Potassium 4.3 4.5  More results in Results Review 3.8   Chloride 94* 93*  More results in Results Review 95*   CO2 24 24  More results in Results Review 22   BUN 83.0* 82.0*  More results in Results Review 84.0*   Creatinine 4.2* 4.3*  More results in Results Review 4.2*   Calcium 9.5 9.9  More results in Results Review 9.6   Albumin 3.4*  --   --  3.4*   Protein, Total 7.0  --   --  6.6   Bilirubin, Total 1.6*  --   --  1.7*   Alkaline Phosphatase 72  --   --  65   ALT 43  --   --  51   AST (SGOT) 26  --   --  23   Glucose 159* 333*  More results in Results Review 105*   More results in Results Review = values in this interval not displayed.                   Microbiology, reviewed and are significant for:  Microbiology Results (last 15 days)       Procedure Component Value Units Date/Time    MRSA culture [id number] Collected: 12/23/22 1200    Order Status: Completed Specimen: Culturette from Nasal Swab Updated: 12/24/22 1311     Culture MRSA Surveillance Negative for Methicillin Resistant Staph aureus    MRSA culture [id number] Collected: 12/23/22 1200    Order Status: Completed Specimen: Culturette from Throat Updated: 12/24/22 1311     Culture MRSA Surveillance Negative for Methicillin Resistant Staph aureus    Culture Blood Aerobic and Anaerobic  [id number] Collected: 12/22/22 2239    Order Status: Completed Specimen: Blood, Venipuncture Updated: 12/28/22 0425    Narrative:      The order will result in two separate 8-45ml bottles  Please do NOT order repeat blood cultures if one has been  drawn within the last 48 hours  UNLESS concerned for  endocarditis  AVOID BLOOD CULTURE DRAWS FROM CENTRAL LINE IF POSSIBLE  Indications:->Pneumonia  ORDER#: [id number]  ORDERED BY: Vear Clock  SOURCE: Blood, Venipuncture                          COLLECTED:  12/22/22 22:39  ANTIBIOTICS AT COLL.:                                RECEIVED :  12/23/22 02:00  Culture Blood Aerobic and Anaerobic        FINAL       12/28/22 04:25  12/28/22   No growth after 5 days of incubation.      Culture Blood Aerobic and Anaerobic [id number] Collected: 12/22/22 2239    Order Status: Completed Specimen: Blood, Venipuncture Updated: 12/28/22 0425    Narrative:      The order will result in two separate 8-23ml bottles  Please do NOT order repeat blood cultures if one has been  drawn within the last 48 hours  UNLESS concerned for  endocarditis  AVOID BLOOD CULTURE DRAWS FROM CENTRAL LINE IF POSSIBLE  Indications:->Pneumonia  ORDER#: [id number]                                    ORDERED BY: Vear Clock  SOURCE: Blood, Venipuncture                          COLLECTED:  12/22/22 22:39  ANTIBIOTICS AT COLL.:                                RECEIVED :  12/23/22 01:59  Culture Blood Aerobic and Anaerobic        FINAL       12/28/22 04:25  12/28/22   No growth after 5 days of incubation.      Respiratory Pathogen Panel w/COVID-19, PCR [id number]     Order Status: Canceled     COVID-19 (SARS-CoV-2) and Influenza A/B, NAA (Liat Rapid)- Admission [id number] Collected: 12/21/22 1050    Order Status: Completed Specimen: Culturette from Nasopharyngeal Updated: 12/21/22 1144     Purpose of COVID testing Diagnostic -PUI     SARS-CoV-2 Specimen Source Nasal Swab     SARS  CoV 2 Overall Result Not Detected     Comment: __________________________________________________  -A result of "Detected" indicates POSITIVE for the    presence of SARS CoV-2 RNA  -A result of "Not Detected" indicates NEGATIVE for the    presence of SARS CoV-2 RNA  __________________________________________________________  Test performed using the Roche cobas Liat SARS-CoV-2 assay. This assay is  only for use under the Food and Drug Administration's Emergency Use  Authorization. This is a real-time RT-PCR assay for the qualitative  detection of SARS-CoV-2 RNA. Viral nucleic acids may persist in vivo,  independent of viability. Detection of viral nucleic acid does not imply the  presence of infectious virus, or that virus nucleic acid is the cause of  clinical symptoms. Negative results do not preclude SARS-CoV-2 infection and  should not be used as the sole basis for diagnosis, treatment or other  patient management decisions. Negative results must be combined with  clinical observations, patient history, and/or epidemiological information.  Invalid results may be due to inhibiting substances in the specimen and  recollection should occur. Please see Fact Sheets  for patients and providers  located:  https://www.benson-chung.com/          Influenza A Not Detected     Influenza B Not Detected     Comment: Test performed using the Roche cobas Liat SARS-CoV-2 & Influenza A/B assay.  This assay is only for use under the Food and Drug Administration's  Emergency Use Authorization. This is a multiplex real-time RT-PCR assay  intended for the simultaneous in vitro qualitative detection and  differentiation of SARS-CoV-2, influenza A, and influenza B virus RNA. Viral  nucleic acids may persist in vivo, independent of viability. Detection of  viral nucleic acid does not imply the presence of infectious virus, or that  virus nucleic acid is the cause of clinical symptoms. Negative results do  not preclude  SARS-CoV-2, influenza A, and/or influenza B infection and  should not be used as the sole basis for diagnosis, treatment or other  patient management decisions. Negative results must be combined with  clinical observations, patient history, and/or epidemiological information.  Invalid results may be due to inhibiting substances in the specimen and  recollection should occur. Please see Fact Sheets for patients and providers  located: http://olson-hall.info/.         Narrative:      o Collect and clearly label specimen type:  o PREFERRED-Upper respiratory specimen: One Nasal Swab in  Transport Media.  o Hand deliver to laboratory ASAP  Diagnostic -PUI    Respiratory Pathogen Panel w/COVID-19, PCR [id number] Collected: 12/21/22 0106    Order Status: Canceled Specimen: Nasopharyngeal     Culture Blood Aerobic and Anaerobic [id number] Collected: 12/21/22 0021    Order Status: Completed Specimen: Blood, Venipuncture Updated: 12/26/22 [id number]    Narrative:      Indications:->Other  Other->infection  ORDER#: [id number]                                    ORDERED BY: LEE, SIEW MEI  SOURCE: Blood, Venipuncture RAC                      COLLECTED:  12/21/22 00:21  ANTIBIOTICS AT COLL.:                                RECEIVED :  12/21/22 04:06  Culture Blood Aerobic and Anaerobic        FINAL       12/26/22 06:37  12/26/22   No growth after 5 days of incubation.      Culture Blood Aerobic and Anaerobic [id number] Collected: 12/21/22 0021    Order Status: Completed Specimen: Blood, Venipuncture Updated: 12/26/22 1147    Narrative:      [id number] called Micro Results of Pos Bld. Results read back [id number], by [id number]  on 12/22/2022 at 07:29  Indications:->Other  Other->infection  ORDER#: [id number]                                    ORDERED BY: LEE, SIEW MEI  SOURCE: Blood, Venipuncture LAC                      COLLECTED:  12/21/22 00:21  ANTIBIOTICS AT COLL.:  RECEIVED :  12/21/22  04:06  [id number] called Micro Results of Pos Bld. Results read back [id number], by [id number] on 12/22/2022 at 07:29  Culture Blood Aerobic and Anaerobic        FINAL       12/26/22 11:47   +  12/22/22   Aerobic Blood Culture Positive in 24 to 48 hours             Gram Stain Shows: Gram positive cocci in clusters  12/26/22   Anaerobic Blood Culture No Growth  12/23/22   Growth of Staphylococcus (coagulase negative)               Possible skin contaminant, susceptibility testing not             performed without request unless additional blood cultures,             collected within 48 hours of this culture, become positive.             Contact the laboratory for further information if necessary.        MRSA culture [id number] Collected: 12/21/22 0000    Order Status: Canceled Specimen: Culturette from Nasal Swab     MRSA culture [id number] Collected: 12/21/22 0000    Order Status: Canceled Specimen: Culturette from Throat             Imaging, reviewed and are significant for:  XR Chest AP Portable    Result Date: 12/31/2022  Cardiomegaly. No pulmonary arterial catheter identified. Montine Circle, MD 12/31/2022 9:47 AM    XR Chest AP Portable    Result Date: 12/30/2022  Pulmonary arterial catheter with tip projecting over the right hilum. Montine Circle, MD 12/30/2022 6:38 PM    XR Chest AP Portable    Result Date: 12/28/2022  Support hardware as above. No new acute findings. Montine Circle, MD 12/28/2022 7:36 AM    XR Chest AP Portable    Result Date: 12/27/2022   Stable positioning of right IJ PA catheter projecting over the right lower lobar pulmonary artery. Blanchard Mane 12/27/2022 11:45 PM    Tunneled Cath Placement (Permcath)    Result Date: 12/25/2022  Right internal jugular vein approach 6 French single lumen Hohn catheter tip in the superior cavoatrial junction. PLAN: Catheter is ready for use. Erik Obey, MD 12/25/2022 3:22 PM        Signed by: Gillis Santa, MD

## 2023-01-01 NOTE — Progress Notes (Signed)
Palliative Medicine & Comprehensive Care  Service Phone Number: FX: [phone], Mon-Fri 9a-4p / [employer] Pager: [phone] (24/7)    Pastoral Care - Consult/Progress Note     Proposed Spiritual Plan of Care: Offer emotional and spiritual support.     Narrative:   Palliative care chaplain met with patient for spiritual assessment/support. Patient shared that he has created a Bible study regiment to deepen his spirituality. Chaplain spent the remainder of the visit facilitating a life review with patient. Patient shared several powerful stories regarding his life. Chaplain encouraged patient to record some his stories as a keepsake to share with his children.     Thank you for this referral.     Assessment:    Spiritual Assessment: Positive outlook, Finding strength in faith, Coping with illness/hospitalization, and Patient needs spiritual/emotional support    Spiritual Strengths: Faith in God/higher power, Supportive family/friends, and Connected to spiritual community    Spiritual Distress: Loss of faith in God and None evident    Background:    Visit Type: Initial    Source: Physician referral    Present at Visit: Patient    Spiritual Care Provided to: Patient only    Length of Visit: Greater than one hour    Summary:     Reason for Request: Emotional support, Spiritual support, and Spiritual assessment    Spiritual Care Interventions: Provided emotional support, Provided reflective and compassionate listening, Provided silent and supportive presence, Provided comfort, encouragement, affirmation, and Made a positive connection with patient    Spiritual Care Outcomes: Patient expressed appreciation of visit and Consulted with physician    Follow up: Follow-up important

## 2023-01-01 NOTE — Progress Notes (Signed)
Date: 01/01/2023     Patient: [name] [name] Problems     Diagnosis    Iron deficiency anemia secondary to inadequate dietary iron intake    Heart failure    Heart failure, unspecified HF chronicity, unspecified heart failure type    Ischemic cardiomyopathy    Acute on chronic right-sided heart failure    Chronic systolic congestive heart failure    Nonischemic cardiomyopathy    Non-ischemic cardiomyopathy         Length of stay: [name] Day 11           Discharge plan: Home with family / supervision, family to transport.      Anticipated date of discharge: tbd     Barriers to discharge: Medical readiness, stable renal function.     CM will continue to follow for discharge needs and will change plans if needed.    Nicanor Alcon, RN, BSN  Case Manager  [phone]

## 2023-01-01 NOTE — Plan of Care (Signed)
Shift Note: 3/15     Orientation: AOx4   Rhythm on tele: Vpaced  Oxygen: RA  Ambulation: SBA w/ walker   Pain: denied  Lines/Drips: 18G LAC; 22G LFA; R chest Hohn cath  GI/GU: cont x2. [place]. LBM 3/14  Fall Score: HIGH    Critical Labs/Imaging/Procedures:   3/4 Tx to CICU for dobutamine.   3/4 echo EF 11%, mod pHTN, mod dialated LV, severe mitral & tricuspid regurg.   3/5 RHC for swan   3/5 Permacath placed.   3/13 dobutamine d/c'd, bumex gtt.   3/14 CXR: cardiomegaly.   3/14 not a candidate for LVAD, palliative consulted.   3/14 downgraded to CTUS from CICU    Comments:   - see doc flow for complete assessment and vitals.     Significant Shift Events and Questions for Attending:    Plan:   - Vitals Q4H   - DW. I/Os  - PO Amio  - Monitor renal function. BMP Q12H  - IV Diuril; PO Torsemide  - PT/OT recs Home w/ supervision.  - Nephro, HF, Onco, Palliative following    (Braden Score: 20)

## 2023-01-01 NOTE — OT Progress Note (Signed)
Occupational Therapy Treatment  [name] [name]        Post Acute Care Therapy Recommendations:     Discharge Recommendations:  Home with supervision  D/C Milestones: increased mobility and ADLs Anticipate achievement in 1-2 sessions    DME needs IF patient is discharging home: Shower chair    Therapy discharge recommendations may change with patient status.  Please refer to most recent note for up-to-date recommendations.    Assessment:   Significant Findings: none    Patient agreeable to OT and tolerated session well.  Pt received in bed and continues to progress towards OT goals.  Pt supervised for supine>sit, when donning socks and when donning gown.  SBA for sit<>stand and during household mobility with RW.  Educated pt in ECT to maximize safety and independence in ADL engagement upon d/c with pt verbalizing understanding.  Continued OT services to address ADL and mobility goals would benefit pt.      Treatment Activities: ADLs, functional mobility/transfers, pt training     Educated the patient to role of occupational therapy, plan of care, goals of therapy and safety with mobility and ADLs, energy conservation techniques, pursed lip breathing.    Plan:   OT Frequency Recommended: 3-4x/wk     Goal Formulation: Patient  OT Plan  Risks/Benefits/POC Discussed with Pt/Family: With patient  Treatment Interventions: ADL retraining, Functional transfer training, UE strengthening/ROM, Endurance training, Patient/Family training, Equipment eval/education  Discharge Recommendation: Home with supervision  DME Recommended for Discharge: Shower chair  OT Frequency Recommended: 3-4x/wk  OT - Next Visit Recommendation: 01/04/23    Continue plan of care.      [facility]  Bed: FI370/FI370-01       Precautions and Contraindications:   Precautions  Other Precautions: falls risk    Updated Medical Status/Imaging/Labs:  No results found.     Subjective: "[place], the [facility]?"   Patient's  medical condition is appropriate for Occupational Therapy intervention at this time.  Patient is agreeable to participation in the therapy session. Nursing clears patient for therapy.    Pain Assessment  Pain Assessment: No/denies pain    Objective:   Observation of Patient/Vital Signs:  Patient is in bed with telemetry and external catheter in place.  Pt wore mask during therapy session:No      Cognition/Neuro Status  Arousal/Alertness: Appropriate responses to stimuli  Attention Span: Appears intact  Orientation Level: Oriented X4  Memory: Appears intact  Following Commands: Follows all commands and directions without difficulty  Safety Awareness: independent  Insights: Fully aware of deficits  Problem Solving: Able to problem solve independently  Behavior: attentive;calm;cooperative    Functional Mobility  Supine to Sit Transfers: Supervision  Sit to Stand Transfers: Stand by Assist  Stand to Sit Transfers: Stand by Assist  Functional Mobility/Ambulation: Stand by Assist (with RW)    Self-care and Home Management  UB Dressing: Supervision (donning/doffing gown)  LB Dressing: Supervision;sitting;Don/doff R sock;Don/doff L sock    Balance  Static Sitting: Supervision  Dynamic Sitting: Supervision  Static Standing: SBA for sit<>stand  Dynamic Standing: SBA with RW     PMP Activity: Step 6 - Walks in Room               Participation: good  Endurance: good      Patient left with call bell within reach, all needs met, SCDs off as found, fall mat in place, chair alarm in place and all questions answered. RN notified of session outcome  and patient response.       Goals:    Time For Goal Achievement: 5 visits  ADL Goals  Patient will groom self: Supervision, 5 visits  Patient will dress upper body: Supervision, 5 visits, Goal met  Patient will dress lower body: Supervision, 5 visits, Goal met  Patient will toilet: Supervision, 5 visits  Mobility and Transfer Goals  Pt will perform functional transfers: Supervision, 5  visits                           PPE worn during session: procedural mask and gloves    Tech present: n/a  PPE worn by tech: N/A      [name], OTR/L  Pager [phone]     Time of Treatment:   OT Received On: 01/01/23  Start Time: [id number]  Stop Time: 0915  Time Calculation (min): 25 min    Treatment # 4 of 5 visits

## 2023-01-01 NOTE — Progress Notes (Signed)
[facility].  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  He will be high risk for further AKI given her advanced heart disease.  Will need dialysis modality planning on [place]       Hassie Shoaib, MD  Epic [employer] Direct (preferred) or  [employer]: [facility]  [facility]: [phone],   Office: [phone],     ++++++++++++++++++++++++++++++++++++++++++++++++++++++++++++++    Subjective:  Out of ICU, off O2 this am  UOP 4.9 L         Medications:  Scheduled Meds:  Current Facility-Administered Medications   Medication Dose Route Frequency    amiodarone  200 mg Oral Daily    aspirin EC  81 mg Oral Daily    atorvastatin  40 mg Oral QHS    chlorothiazide  1,000 mg Intravenous BID    heparin (porcine)  5,000 Units Subcutaneous Q8H [place]    hydrALAZINE  100 mg Oral Q8H [place]    insulin glargine  33 Units Subcutaneous Q12H    Or    insulin glargine  15 Units Subcutaneous Q12H    insulin lispro  1-6 Units Subcutaneous QHS    insulin lispro  10 Units Subcutaneous TID AC    insulin lispro  2-10 Units Subcutaneous TID AC    isosorbide dinitrate  40 mg Oral TID - Nitrate Free Interval    niacin  500 mg Oral BID    polyethylene glycol  17 g Oral Daily    senna-docusate  1 tablet Oral Q12H [place]     Continuous Infusions:      PRN Meds:acetaminophen, dextrose **OR** dextrose **OR**  dextrose **OR** glucagon (rDNA), melatonin, phenol, simethicone    Objective:  Vital signs in last 24 hours:  Temp:  [97.3 F (36.3 C)-98.1 F (36.7 C)] 97.3 F (36.3 C)  Heart Rate:  [72-88] 80  Resp Rate:  [17-80] 17  BP: (104-123)/(58-71) 113/71  Arterial Line BP: (118-126)/(51) 118/51  Intake/Output last 24 hours:    Intake/Output Summary (Last 24 hours) at 01/01/2023 0856  Last data filed at 01/01/2023 0500  Gross per 24 hour   Intake 1416 ml   Output 4750 ml   Net -3334 ml     Intake/Output this shift:  No intake/output data recorded.    Physical Exam:   Gen: No acute distress   HEENT: normocephalic   CV: S1 S2 No rub   Chest: improving bilaterally   Ab: Nondistended,     Ext: + leg edema improving    Labs:  Recent Labs   Lab 01/01/23  0421 12/31/22  1734 12/31/22  1023 12/31/22  0357 12/30/22  1131 12/30/22  0408   Glucose  159* 333* 160* 105*  More results in Results Review 132*   BUN 83.0* 82.0* 80.0* 84.0*  More results in Results Review 79.0*   Creatinine 4.2* 4.3* 4.1* 4.2*  More results in Results Review 4.7*   Calcium 9.5 9.9 9.7 9.6  More results in Results Review 8.8   Sodium 133* 132* 133* 134*  More results in Results Review 134*   Potassium 4.3 4.5 4.3 3.8  More results in Results Review 4.0   Chloride 94* 93* 94* 95*  More results in Results Review 97*   CO2 24 24 25 22   More results in Results Review 24   Albumin 3.4*  --   --  3.4*  --  3.1*   Magnesium 2.3 2.3 2.2 2.2  More results in Results Review 2.4   More results in Results Review = values in this interval not displayed.     Recent Labs   Lab 01/01/23  0421 12/31/22  0357 12/30/22  0408   WBC 10.26* 9.55* 8.46   Hgb 9.9* 9.3* 8.7*   Hematocrit 30.2* 27.6* 26.5*   MCV 100.0* 98.2* 100.8*   MCH 32.8 33.1 33.1   MCHC 32.8 33.7 32.8   RDW 17* 17* 17*   MPV 11.6 11.6 11.7   Platelets 125* 113* 85*

## 2023-01-01 NOTE — UM Notes (Signed)
PATIENT NAME: [name] [name], [name] [name]   DOB: 1946/08/04     Continued Stay Review:   3/15  77 y.o. 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 Subsequent readmission to CICU on 3/10 for worsening renal function and volume overload. Now off dobutamine as of 3/13 with swan removed and on stable dose of Bumex gtt and Diuril. Evaluated for LVAD and pt not a candidate given poor renal function.           Vital Signs  BP 115/66   Pulse 82   Temp 97.7 F (36.5 C) (Oral)   Resp 17   Ht 1.829 m (6' 0.01")   Wt 84.1 kg (185 lb 8 oz)   SpO2 99%   BMI 25.15 kg/m       Medications  Scheduled Meds:  Current Facility-Administered Medications   Medication Dose Route Frequency    amiodarone  200 mg Oral Daily    aspirin EC  81 mg Oral Daily    atorvastatin  40 mg Oral QHS    chlorothiazide  1,000 mg Intravenous BID    heparin (porcine)  5,000 Units Subcutaneous Q8H [place]    hydrALAZINE  100 mg Oral Q8H [place]    insulin glargine  33 Units Subcutaneous Q12H    Or    insulin glargine  15 Units Subcutaneous Q12H    insulin lispro  1-6 Units Subcutaneous QHS    insulin lispro  10 Units Subcutaneous TID AC    insulin lispro  2-10 Units Subcutaneous TID AC    isosorbide dinitrate  40 mg Oral TID - Nitrate Free Interval    niacin  500 mg Oral BID    polyethylene glycol  17 g Oral Daily    senna-docusate  1 tablet Oral Q12H [place]    torsemide  80 mg Oral BID       Abnormal Labs   01/01/23 04:21 01/01/23 07:16 01/01/23 11:11   WBC 10.26 (H)     Hemoglobin 9.9 (L)     Hematocrit 30.2 (L)     Platelet Count 125 (L)     RBC 3.02 (L)     MCV 100.0 (H)     RDW 17 (H)     Lymphocytes Absolute Automated 3.38 (H)     Monocytes Absolute Automated 1.27 (H)     Glucose 159 (H)     Whole Blood Glucose POCT   295 (H) 196 (H)   BUN 83.0 (H)     Creatinine 4.2 (H)     Sodium 133 (L)     Chloride 94 (L)     eGFR 13.9 !     Albumin 3.4 (L)     Bilirubin Total 1.6 (H)     Bilirubin Direct 0.6 (H)       Plan of Care  Nephrology  Recommendations/Plan:  Cr stable in low 4s amidst large diuresis  - off bumex drip with weight 185 lbs today, starting torsemide 80 bid   - No acute need for HD/RRT   - trend BMP, strict ins and outs  - continues on hydralazine and isordil  - daily standing scale weights  - he plans to follow up with Dr Kalman Shan     Heart Failure;  Cardiogenic shock  Preload: Continue with Bumex gtt at 3 mg/hr, defer to ICU team regarding transition to bolus intermittent IV regimen   Inotropes: off dobutamine, not needed for now  Afterload: continue hydralazine 100 mg TID and isordil 20 TID  MCS: currently not needed  Advanced therapies candidacy: he is not a candidate to OHTx due to age and LVAD due to multiple medical comorbidities including biventricular dysfunction, AKI on Stage 4 CKD, frailty, and debility.   Palliative care consult placed. Patient will likely benefit from a hospice referral.   In case of deterioration the patient is not a candidate for MCS given lack of exit strategies.  Ischemic Cardiomyopathy, EF 11%  Diuresis as above, will need to transition to appropriate oral regimen, defer to CICU team  GDMT:  Beta Blockers: hold in the setting of shock  ACEi/ARB/ARNI: hold in the setting of CKD  MRA: hold in the setting of CKD  SGLT2i: hold in the setting of CKD    UTILIZATION REVIEW CONTACT: [place]  [phone]  [phone]  Email: [email or web address]  Tax IDVM:[id number]         NOTES TO REVIEWER:    This clinical review is based on/compiled from documentation provided by the treatment team within the patient's medical record.

## 2023-01-01 NOTE — Progress Notes (Signed)
HEMATOLOGY ONCOLOGY PROGRESS NOTE  [facility] [place]   [phone]) [phone]    Date: 01/01/23    Assessment:   #CLL. Okay to hold acalabrutinib for now until discharge. Disease had been controlled on treatment. Peak ALC before starting treatment was 40. Labs are adequate.  Hgb is stable and platelets as well.  Can hold calquence till he sees Dr. Rolanda Lundborg    Will sign off   Please call w/ questions    #Thrombocytopenia. Chronic; baseline in the low 100s; currently 85k. Okay to monitor.     #heart failure; cardiogenic shock. As per primary team and cardiology.    Christene Slates, MD, MD  [facility]  [phone]    Subjective:   The patient is a 77 yo male who follows with Dr. Mervin Kung of [place] for CLL who was started on acalabrutinib in July 2023 given worsening adenopathy and WBC.    Admitted now due to heart failure.    Physical Exam     Vitals:    01/01/23 1114   BP: 115/66   Pulse: 82   Resp: 17   Temp: 97.7 F (36.5 C)   SpO2: 99%       General: Alert and oriented, no acute distress  Head and neck: Oropharynx clear, eyes normal, no sinus congestion  Respiratory: Lungs clear to auscultation, normal work of breathing, no coughs  Cardiovascular: Regular rate and rhythm, no murmurs or gallops  Abdomen: Soft, nontender, nondistended, normal bowel sounds  Extremities: Well perfused, no edema  Neuro: No focal deficits, strength equal bilaterally, no confusion  Psych: Appropriate mood and affect    Medications     Current Facility-Administered Medications   Medication Dose Route Frequency    amiodarone  200 mg Oral Daily    aspirin EC  81 mg Oral Daily    atorvastatin  40 mg Oral QHS    chlorothiazide  1,000 mg Intravenous BID    heparin (porcine)  5,000 Units Subcutaneous Q8H [place]    hydrALAZINE  100 mg Oral Q8H [place]    insulin glargine  33 Units Subcutaneous Q12H    Or    insulin glargine  15 Units Subcutaneous Q12H    insulin lispro  1-6 Units Subcutaneous QHS    insulin lispro  10 Units  Subcutaneous TID AC    insulin lispro  2-10 Units Subcutaneous TID AC    isosorbide dinitrate  40 mg Oral TID - Nitrate Free Interval    niacin  500 mg Oral BID    polyethylene glycol  17 g Oral Daily    senna-docusate  1 tablet Oral Q12H [place]    torsemide  80 mg Oral BID         Labs:     Results       Procedure Component Value Units Date/Time    Glucose Whole Blood - POCT [id number]  (Abnormal) Collected: 01/01/23 1111     Updated: 01/01/23 1146     Whole Blood Glucose POCT 196 mg/dL     Glucose Whole Blood - POCT [id number]  (Abnormal) Collected: 01/01/23 0716     Updated: 01/01/23 0743     Whole Blood Glucose POCT 295 mg/dL     Hepatic function panel (LFT) [id number]  (Abnormal) Collected: 01/01/23 0421    Specimen: Blood Updated: 01/01/23 0529     Bilirubin, Total 1.6 mg/dL      Bilirubin Direct 0.6 mg/dL      Bilirubin Indirect 1.0  mg/dL      AST (SGOT) 26 U/L      ALT 43 U/L      Alkaline Phosphatase 72 U/L      Protein, Total 7.0 g/dL      Albumin 3.4 g/dL      Globulin 3.6 g/dL      Albumin/Globulin Ratio 0.9    Basic Metabolic Panel [id number]  (Abnormal) Collected: 01/01/23 0421    Specimen: Blood Updated: 01/01/23 0529     Glucose 159 mg/dL      BUN 83.0 mg/dL      Creatinine 4.2 mg/dL      Calcium 9.5 mg/dL      Sodium 133 mEq/L      Potassium 4.3 mEq/L      Chloride 94 mEq/L      CO2 24 mEq/L      Anion Gap 15.0     eGFR 13.9 mL/min/1.73 m2     Magnesium [id number] Collected: 01/01/23 0421    Specimen: Blood Updated: 01/01/23 0529     Magnesium 2.3 mg/dL     CBC and differential [id number]  (Abnormal) Collected: 01/01/23 0421    Specimen: Blood Updated: 01/01/23 0518     WBC 10.26 x10 3/uL      Hgb 9.9 g/dL      Hematocrit 30.2 %      Platelets 125 x10 3/uL      RBC 3.02 x10 6/uL      MCV 100.0 fL      MCH 32.8 pg      MCHC 32.8 g/dL      RDW 17 %      MPV 11.6 fL      Instrument Absolute Neutrophil Count 5.43 x10 3/uL      Neutrophils 52.9 %      Lymphocytes Automated 32.9 %      Monocytes 12.4 %       Eosinophils Automated 1.3 %      Basophils Automated 0.1 %      Immature Granulocytes 0.4 %      Nucleated RBC 0.0 /100 WBC      Neutrophils Absolute 5.43 x10 3/uL      Lymphocytes Absolute Automated 3.38 x10 3/uL      Monocytes Absolute Automated 1.27 x10 3/uL      Eosinophils Absolute Automated 0.13 x10 3/uL      Basophils Absolute Automated 0.01 x10 3/uL      Immature Granulocytes Absolute 0.04 x10 3/uL      Absolute NRBC 0.00 x10 3/uL     Glucose Whole Blood - POCT [id number]  (Abnormal) Collected: 12/31/22 2114     Updated: 12/31/22 2117     Whole Blood Glucose POCT 226 mg/dL     Type and Screen [id number] Collected: 12/31/22 1734    Specimen: Blood Updated: 12/31/22 1851     ABO Rh O POS     AB Screen Gel NEG    Narrative:      Pre-Surgical/Pre-Procedure->No  For transfusion?->Yes    Basic Metabolic Panel [id number]  (Abnormal) Collected: 12/31/22 1734    Specimen: Blood Updated: 12/31/22 1809     Glucose 333 mg/dL      BUN 82.0 mg/dL      Creatinine 4.3 mg/dL      Calcium 9.9 mg/dL      Sodium 132 mEq/L      Potassium 4.5 mEq/L      Chloride 93 mEq/L      CO2 24 mEq/L  Anion Gap 15.0     eGFR 13.5 mL/min/1.73 m2     Magnesium [id number] Collected: 12/31/22 1734    Specimen: Blood Updated: 12/31/22 1809     Magnesium 2.3 mg/dL     Glucose Whole Blood - POCT [id number]  (Abnormal) Collected: 12/31/22 1632     Updated: 12/31/22 1635     Whole Blood Glucose POCT 291 mg/dL               Imaging   XR Chest AP Portable    Result Date: 12/31/2022  Cardiomegaly. No pulmonary arterial catheter identified. Montine Circle, MD 12/31/2022 9:47 AM    XR Chest AP Portable    Result Date: 12/30/2022  Pulmonary arterial catheter with tip projecting over the right hilum. Montine Circle, MD 12/30/2022 6:38 PM    XR Chest AP Portable    Result Date: 12/28/2022  Support hardware as above. No new acute findings. Montine Circle, MD 12/28/2022 7:36 AM    XR Chest AP Portable    Result Date: 12/27/2022   Stable positioning of  right IJ PA catheter projecting over the right lower lobar pulmonary artery. Blanchard Mane 12/27/2022 11:45 PM    Tunneled Cath Placement (Permcath)    Result Date: 12/25/2022  Right internal jugular vein approach 6 French single lumen Hohn catheter tip in the superior cavoatrial junction. PLAN: Catheter is ready for use. Erik Obey, MD 12/25/2022 3:22 PM    X-ray chest AP portable    Result Date: 12/25/2022  No change mild edema [name], MD 12/25/2022 8:36 AM    X-ray chest AP portable    Result Date: 12/24/2022  Stable exam. No new acute intrathoracic process. Montine Circle, MD 12/24/2022 11:44 AM    X-ray chest AP portable    Result Date: 12/23/2022  Improved inspiratory effort with mild dependent atelectasis [name], MD 12/23/2022 8:16 AM    XR Chest AP Portable    Result Date: 12/23/2022   Airspace disease in the medial right lower lobe. Blanchard Mane 12/23/2022 12:46 AM    XR Chest AP Portable    Result Date: 12/22/2022  1. Right Swan-Ganz catheter tip in the proximal right lower pulmonary artery. No pneumothorax. 2. Mild vascular congestion, small right pleural effusion and right basilar atelectasis. Ardelia Mems, MD 12/22/2022 4:59 PM    Chest AP Portable    Result Date: 12/20/2022  No acute intrathoracic process. Montine Circle, MD 12/20/2022 9:12 PM

## 2023-01-01 NOTE — Plan of Care (Incomplete)
Shift Note: CTUS-PM     Orientation: AOx4   Rhythm on tele: Vpaced  Oxygen: RA  Ambulation: SBA w/ walker   Pain: denied  Lines/Drips: 18G LAC; 22G LFA; R chest Hohn cath  GI/GU: cont x2. [place]. LBM 3/14  Fall Score: HIGH     Critical Labs/Imaging/Procedures:   3/4 Tx to CICU for dobutamine.   3/4 echo EF 11%, mod pHTN, mod dialated LV, severe mitral & tricuspid regurg.   3/5 RHC for swan   3/5 Permacath placed.   3/13 dobutamine d/c'd, bumex gtt.   3/14 CXR: cardiomegaly.   3/14 not a candidate for LVAD, palliative consulted.   3/14 downgraded to CTUS from CICU     Comments:   - see doc flow for complete assessment and vitals.      Significant Shift Events and Questions for Attending:     Plan:   - Vitals Q4H   - DW. I/Os  - PO Amio  - Monitor renal function. BMP Q12H  - IV Diuril; PO Torsemide  - not a candidate for LVAD   - PT/OT recs Home w/ supervision.  - Nephro, HF, Onco, Palliative following     (Braden Score: 20)    ADMIT 3/4 to ICU with cardiogenic shock & AKI on CKD. SVT w/failed CV, successful adenosine. Tx from [facility] for LVAD eval.    PMHx: HLD. HTN. DM2. 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

## 2023-01-02 LAB — CBC AND DIFFERENTIAL
Absolute NRBC: 0 10*3/uL (ref 0.00–0.00)
Absolute NRBC: 0.02 10*3/uL — ABNORMAL HIGH (ref 0.00–0.00)
Basophils Absolute Automated: 0.01 10*3/uL (ref 0.00–0.08)
Basophils Automated: 0.1 %
Eosinophils Absolute Automated: 0.11 10*3/uL (ref 0.00–0.44)
Eosinophils Automated: 1 %
Hematocrit: 31.5 % — ABNORMAL LOW (ref 37.6–49.6)
Hematocrit: 32.3 % — ABNORMAL LOW (ref 37.6–49.6)
Hgb: 10.3 g/dL — ABNORMAL LOW (ref 12.5–17.1)
Hgb: 10.8 g/dL — ABNORMAL LOW (ref 12.5–17.1)
Immature Granulocytes Absolute: 0.05 10*3/uL (ref 0.00–0.07)
Immature Granulocytes: 0.4 %
Instrument Absolute Neutrophil Count: 6.15 10*3/uL (ref 1.10–6.33)
Instrument Absolute Neutrophil Count: 6.33 10*3/uL (ref 1.10–6.33)
Lymphocytes Absolute Automated: 4.34 10*3/uL — ABNORMAL HIGH (ref 0.42–3.22)
Lymphocytes Automated: 38.4 %
MCH: 33.2 pg (ref 25.1–33.5)
MCH: 33.3 pg (ref 25.1–33.5)
MCHC: 32.7 g/dL (ref 31.5–35.8)
MCHC: 33.4 g/dL (ref 31.5–35.8)
MCV: 101.6 fL — ABNORMAL HIGH (ref 78.0–96.0)
MCV: 99.7 fL — ABNORMAL HIGH (ref 78.0–96.0)
MPV: 11.4 fL (ref 8.9–12.5)
MPV: 11.6 fL (ref 8.9–12.5)
Monocytes Absolute Automated: 0.45 10*3/uL (ref 0.21–0.85)
Monocytes: 4 %
Neutrophils Absolute: 6.33 10*3/uL (ref 1.10–6.33)
Neutrophils: 56.1 %
Nucleated RBC: 0 /100 WBC (ref 0.0–0.0)
Nucleated RBC: 0.2 /100 WBC — ABNORMAL HIGH (ref 0.0–0.0)
Platelets: 156 10*3/uL (ref 142–346)
Platelets: 157 10*3/uL (ref 142–346)
RBC: 3.1 10*6/uL — ABNORMAL LOW (ref 4.20–5.90)
RBC: 3.24 10*6/uL — ABNORMAL LOW (ref 4.20–5.90)
RDW: 17 % — ABNORMAL HIGH (ref 11–15)
RDW: 18 % — ABNORMAL HIGH (ref 11–15)
WBC: 12.95 10*3/uL — ABNORMAL HIGH (ref 3.10–9.50)
WBC: 11.29 10*3/uL — ABNORMAL HIGH (ref 3.10–9.50)

## 2023-01-02 LAB — BASIC METABOLIC PANEL
Anion Gap: 13 (ref 5.0–15.0)
Anion Gap: 16 — ABNORMAL HIGH (ref 5.0–15.0)
BUN: 96 mg/dL — ABNORMAL HIGH (ref 9.0–28.0)
BUN: 97 mg/dL — ABNORMAL HIGH (ref 9.0–28.0)
CO2: 27 mEq/L (ref 17–29)
CO2: 23 mEq/L (ref 17–29)
Calcium: 9.7 mg/dL (ref 7.9–10.2)
Calcium: 9.7 mg/dL (ref 7.9–10.2)
Chloride: 93 mEq/L — ABNORMAL LOW (ref 99–111)
Chloride: 90 mEq/L — ABNORMAL LOW (ref 99–111)
Creatinine: 3.8 mg/dL — ABNORMAL HIGH (ref 0.5–1.5)
Creatinine: 3.8 mg/dL — ABNORMAL HIGH (ref 0.5–1.5)
Glucose: 57 mg/dL — ABNORMAL LOW (ref 70–100)
Glucose: 217 mg/dL — ABNORMAL HIGH (ref 70–100)
Potassium: 4.2 mEq/L (ref 3.5–5.3)
Potassium: 4.7 mEq/L (ref 3.5–5.3)
Sodium: 133 mEq/L — ABNORMAL LOW (ref 135–145)
Sodium: 129 mEq/L — ABNORMAL LOW (ref 135–145)
eGFR: 15.7 mL/min/{1.73_m2} — AB (ref >=60)
eGFR: 15.7 mL/min/{1.73_m2} — AB (ref >=60)

## 2023-01-02 LAB — MAGNESIUM
Magnesium: 2.2 mg/dL (ref 1.6–2.6)
Magnesium: 2.1 mg/dL (ref 1.6–2.6)

## 2023-01-02 LAB — HEPATIC FUNCTION PANEL (LFT)
ALT: 36 U/L (ref 0–55)
AST (SGOT): 20 U/L (ref 5–41)
Albumin/Globulin Ratio: 1 (ref 0.9–2.2)
Albumin: 3.5 g/dL (ref 3.5–5.0)
Alkaline Phosphatase: 86 U/L (ref 37–117)
Bilirubin Direct: 0.7 mg/dL — ABNORMAL HIGH (ref 0.0–0.5)
Bilirubin Indirect: 0.8 mg/dL (ref 0.2–1.0)
Bilirubin, Total: 1.5 mg/dL — ABNORMAL HIGH (ref 0.2–1.2)
Globulin: 3.5 g/dL (ref 2.0–3.6)
Protein, Total: 7 g/dL (ref 6.0–8.3)

## 2023-01-02 LAB — MAN DIFF ONLY
Atypical Lymphocytes %: 3 %
Atypical Lymphocytes Absolute: 0.39 10*3/uL — ABNORMAL HIGH (ref 0.00–0.00)
Band Neutrophils Absolute: 0 10*3/uL (ref 0.00–1.00)
Band Neutrophils: 0 %
Basophils Absolute Manual: 0 10*3/uL (ref 0.00–0.08)
Basophils Manual: 0 %
Eosinophils Absolute Manual: 0.26 10*3/uL (ref 0.00–0.44)
Eosinophils Manual: 2 %
Lymphocytes Absolute Manual: 4.4 10*3/uL — ABNORMAL HIGH (ref 0.42–3.22)
Lymphocytes Manual: 34 %
Monocytes Absolute: 0.26 10*3/uL (ref 0.21–0.85)
Monocytes Manual: 2 %
Neutrophils Absolute Manual: 7.64 10*3/uL — ABNORMAL HIGH (ref 1.10–6.33)
Segmented Neutrophils: 59 %

## 2023-01-02 LAB — URINALYSIS WITH REFLEX TO MICROSCOPIC EXAM IF INDICATED
Bilirubin, UA: NEGATIVE
Blood, UA: NEGATIVE
Glucose, UA: NEGATIVE
Ketones UA: NEGATIVE
Leukocyte Esterase, UA: NEGATIVE
Nitrite, UA: NEGATIVE
Protein, UR: NEGATIVE
Specific Gravity UA: 1.015 (ref 1.001–1.035)
Urine pH: 7 (ref 5.0–8.0)
Urobilinogen, UA: NORMAL mg/dL

## 2023-01-02 LAB — WHOLE BLOOD GLUCOSE POCT
Whole Blood Glucose POCT: 72 mg/dL (ref 70–100)
Whole Blood Glucose POCT: 153 mg/dL — ABNORMAL HIGH (ref 70–100)
Whole Blood Glucose POCT: 217 mg/dL — ABNORMAL HIGH (ref 70–100)
Whole Blood Glucose POCT: 129 mg/dL — ABNORMAL HIGH (ref 70–100)

## 2023-01-02 LAB — CELL MORPHOLOGY
Cell Morphology: ABNORMAL — AB
Platelet Estimate: NORMAL

## 2023-01-02 NOTE — Plan of Care (Signed)
Shift Note: CTUS-PM     Orientation: AOx4   Rhythm on tele: Vpaced  Oxygen: RA  Ambulation: SBA w/ walker   Pain: denied  Lines/Drips: 18G LAC; 22G LFA; R chest Hohn cath  GI/GU: cont x2. [place]. LBM 3/14  Fall Score: HIGH     Critical Labs/Imaging/Procedures:   3/4 Tx to CICU for dobutamine.   3/4 echo EF 11%, mod pHTN, mod dialated LV, severe mitral & tricuspid regurg.   3/5 RHC for swan   3/5 Permacath placed.   3/13 dobutamine d/c'd, bumex gtt.   3/14 CXR: cardiomegaly.   3/14 not a candidate for LVAD, palliative consulted.   3/14 downgraded to CTUS from CICU     Comments:   - see doc flow for complete assessment and vitals.      Significant Shift Events and Questions for Attending:     Plan:   - Vitals Q4H   - DW. I/Os  - PO Amio  - Monitor renal function. BMP Q12H-next at 0400hrs  - PO Torsemide  - not a candidate for LVAD   - PT/OT recs Home w/ supervision.  - [place] tomorrow pending weight, WBC count, I/Os  - RIJ can be removed at [place] by IR.  - Nephro, HF, Onco, Palliative following     Braden Score: 20      3/4 to ICU with cardiogenic shock & AKI on CKD. SVT w/failed CV, successful adenosine. Tx from [facility] for LVAD eval.    PMHx: HLD. HTN. DM2. 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

## 2023-01-02 NOTE — Consults (Addendum)
hift Note: CTUS AM     Orientation: AOx4   Rhythm on tele: Vpaced  Oxygen: RA  Ambulation: SBA w/ walker   Pain: denies  Lines/Drips: 18G LAC; 22G LFA; R chest Hohn cath  GI/GU: cont x2. [place]. LBM 3/14  Fall Score: moderate     Critical Labs/Imaging/Procedures:   3/4 Tx to CICU for dobutamine.   3/4 echo EF 11%, mod pHTN, mod dialated LV, severe mitral & tricuspid regurg.   3/5 RHC for swan   3/5 Permacath placed.   3/13 dobutamine d/c'd, bumex gtt.   3/14 CXR: cardiomegaly.   3/14 not a candidate for LVAD, palliative consulted.   3/14 downgraded to CTUS from CICU     Comments:   - see doc flow for complete assessment and vitals.      Significant Shift Events and Questions for Attending:     Plan:   - Vitals Q4H   - DW. I/Os  - PO Amio  - Monitor renal function. BMP Q12H- next at 0400  - IV Diuril; PO Torsemide  - not a candidate for LVAD   - PT/OT recs Home w/ supervision.  - Nephro, HF, Onco, Palliative following  -[place] tomorrow pending weight, WBC count, I/Os  -RIJ is not an HD line, was initially placed for home dobutamine therapy,  can be removed at . Is tunneled, per 3/08 IR note. Will need to be removed by IR.

## 2023-01-02 NOTE — Progress Notes (Signed)
MEDICINE PROGRESS NOTE    Date Time: 01/02/23 4:26 PM  Patient Name: [name] [name]  Attending Physician: Gillis Santa, MD    Assessment:   77 y.o. male with PMHx NICM/ICM s/p [employer] ICD, CAD s/p PCI with DES, T2DM, stage IV CLL, CKD3b, RLE DVT on home Eliquis admitted to [facility] for SVT and transferred to [facility] CICU for LVAD evaluation where diuresis and cardiac medications were optimized, and pt was downgraded to the medicine floor on 3/8. Subsequent readmission to CICU on 3/10 for worsening renal function and volume overload. Now off dobutamine as of 3/13 with swan removed and on stable dose of Bumex gtt and Diuril. Evaluated for LVAD and pt not a candidate given poor renal function. Pt now on the hospitalist team.    Plan:   # acute decompensated systolic CHF with Cardiogenic shock /Ischemic cardiomyopathy  EF 11%, NYHA Class 4, S/p Medtronic BiV ICD, h/o MR, TR  - no longer on Bumex gtt. D/w nephrology - Dr. Markus Daft. Pt is on torsemide 80mg  po BID.  - creatinine improved to 3.8 today. No acute need for HD/RRT  - not on oxygen supplementation  - hemodynamically stable now- off dobutamine gtt  - not a candidate for LVAD with his poor renal function  - continue with hydralazine and Isordil.  - I requested [place] [facility] to follow up on the patient.   - chlorthiazide 1000mg  IV BID ordered by nephrology.  - follow up on BMP in am.     # Narrow complex supraventricular tachycardia, s/p unsuccessful cardioversion 3/4  - amiodarone  - cardiac monitoring    #  Coronary artery disease s/p PCI/DES in 2011 with 3 stents and then 2 stents when he was in [place]    # Hyperlipidemia  - statin, niacin    # Hypertension  -improved.  - Chlorthiazide, hydralazine    #  AKI on CKD stage IV with baseline creatinine 1.6 as of 03/2022  - follow up with nephrology, see above  - I discussed with nephrology today- Dr. Markus Daft. He recommended restarting his torsemide 80mg  po BID.  - follow up on BMP    #  Stage IV CLL, diagnosed in  September 2021  - 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  -Seen by oncology. Hold the [place] as it does have an anticoagulant effect and may contribute to arrhythmia.   - stable, needs outpatient follow up.    # Right lower extremity DVT (5/23)   - on home eliquis    #  Type 2 diabetes with an A1c 5.9%  - continue with lantus, lispro    #  Stable COPD    #  Thrombocytopenia  - improved to 157K today    #  Iron deficit anemia   - Iron studies (3/6): Iron sat 10, Ferritin 57  - S/p Iron repletion (iron sucrose 200 mg x5 from 3/6 to 3/10)    # Depression   -Consult to psych     # Aspiration event  -Aspirated on 12/22/22, passed swallow eval thereafter     # leukocytosis  - likely reactive   - UA neg. Repeat CBC showing improvement in WBC count.  - no signs of active infection at this time.    # [place]  -Multiple hospital admissions for acute decompensated heart failure in 2023  - palliative care consulted as pt seems to be hospice appropriate, but pt is declining hospice.     Follow up with cardiology and nephrology regarding  discharge. D/w nephrology- recommended keeping him here for continued monitoring of his renal function.       Additional Diagnoses:             Safety Checklist:     DVT prophylaxis:  CHEST guideline (See page [id number]) Chemical   Foley:  [place] Rn Foley protocol Not present   IVs:  Peripheral IV   PT/OT: Ordered   Daily CBC & or Chem ordered:  SHM/ABIM guidelines (see #5) Yes, due to clinical and lab instability   Reference for approximate charges of common labs: CBC auto diff - $76  BMP - $99  Mg - $79    Lines:     Patient Lines/Drains/Airways Status       Active PICC Line / CVC Line / PIV Line / Drain / Airway / Intraosseous Line / Epidural Line / ART Line / Line / Wound / Pressure Ulcer / NG/OG Tube       Name Placement date Placement time Site Days    Implanted Catheter 12/25/22 Hohn Tunneled Right Internal jugular 12/25/22  1500  Internal jugular  6    Peripheral IV 12/21/22 18 G Left Antecubital  12/21/22  2200  Antecubital  10    Peripheral IV 12/26/22 22 G Anterior;Left Forearm 12/26/22  1143  Forearm  5    External Urinary Catheter 12/24/22  2000  --  7                     Disposition: (Please see PAF column for Expected D/C Date)   Today's date: 01/02/2023  Admit Date: 12/21/2022  9:55 PM  LOS: 12  Clinical Milestones: stability of labs   Anticipated discharge needs: HH      Subjective     CC: Heart failure    Interval History/24 hour events: doing better. No new complaints. On RA. Denies any short of breath. Poor historian.    No chest pains/palpitations. Not on oxygen supplementation.         Review of Systems:     As per HPI    Physical Exam:     VITAL SIGNS PHYSICAL EXAM   Temp:  [97.5 F (36.4 C)-98.1 F (36.7 C)] 97.5 F (36.4 C)  Heart Rate:  [81-84] 81  Resp Rate:  [17-20] 17  BP: (105-120)/(62-73) 120/73  Blood Glucose:    Telemetry:  V paced      Intake/Output Summary (Last 24 hours) at 01/02/2023 1626  Last data filed at 01/02/2023 1500  Gross per 24 hour   Intake 603 ml   Output 2790 ml   Net -2187 ml    Physical Exam  General: awake, alert X 4  Cardiovascular: regular rate and rhythm, holosystolic murmurs, rubs or gallops  Lungs: clear to auscultation bilaterally, without wheezing, rhonchi, or rales  Abdomen: soft, non-tender, non-distended; no palpable masses,  normoactive bowel sounds  Extremities: edema 1 +       Meds:     Medications were reviewed:  Current Facility-Administered Medications   Medication Dose Route Frequency    amiodarone  200 mg Oral Daily    aspirin EC  81 mg Oral Daily    atorvastatin  40 mg Oral QHS    chlorothiazide  1,000 mg Intravenous BID    heparin (porcine)  5,000 Units Subcutaneous Q8H [place]    hydrALAZINE  100 mg Oral Q8H [place]    insulin glargine  33 Units Subcutaneous Q12H    Or  insulin glargine  15 Units Subcutaneous Q12H    insulin lispro  1-6 Units Subcutaneous QHS    insulin lispro  10 Units Subcutaneous TID AC    insulin lispro  2-10 Units Subcutaneous  TID AC    isosorbide dinitrate  40 mg Oral TID - Nitrate Free Interval    niacin  500 mg Oral BID    polyethylene glycol  17 g Oral Daily    senna-docusate  1 tablet Oral Q12H SCH    torsemide  80 mg Oral BID     Current Facility-Administered Medications   Medication Dose Route Frequency Last Rate     Current Facility-Administered Medications   Medication Dose Route    acetaminophen  650 mg Oral    dextrose  15 g of glucose Oral    Or    dextrose  12.5 g Intravenous    Or    dextrose  12.5 g Intravenous    Or    glucagon (rDNA)  1 mg Intramuscular    melatonin  6 mg Oral    phenol  1 spray Oral    simethicone  80 mg Oral         Labs:     Labs (last 72 hours):    Recent Labs   Lab 01/02/23  1345 01/02/23  0518   WBC 11.29* 12.95*   Hgb 10.8* 10.3*   Hematocrit 32.3* 31.5*   Platelets 157 156          Recent Labs   Lab 01/02/23  1345 01/02/23  0518 01/01/23  2006 01/01/23  0421   Sodium 129* 133*  More results in Results Review 133*   Potassium 4.7 4.2  More results in Results Review 4.3   Chloride 90* 93*  More results in Results Review 94*   CO2 23 27  More results in Results Review 24   BUN 97.0* 96.0*  More results in Results Review 83.0*   Creatinine 3.8* 3.8*  More results in Results Review 4.2*   Calcium 9.7 9.7  More results in Results Review 9.5   Albumin  --  3.5  --  3.4*   Protein, Total  --  7.0  --  7.0   Bilirubin, Total  --  1.5*  --  1.6*   Alkaline Phosphatase  --  86  --  72   ALT  --  36  --  43   AST (SGOT)  --  20  --  26   Glucose 217* 57*  More results in Results Review 159*   More results in Results Review = values in this interval not displayed.                   Microbiology, reviewed and are significant for:  Microbiology Results (last 15 days)       Procedure Component Value Units Date/Time    MRSA culture [id number] Collected: 12/23/22 1200    Order Status: Completed Specimen: Culturette from Nasal Swab Updated: 12/24/22 1311     Culture MRSA Surveillance Negative for Methicillin  Resistant Staph aureus    MRSA culture [id number] Collected: 12/23/22 1200    Order Status: Completed Specimen: Culturette from Throat Updated: 12/24/22 1311     Culture MRSA Surveillance Negative for Methicillin Resistant Staph aureus    Culture Blood Aerobic and Anaerobic [id number] Collected: 12/22/22 2239    Order Status: Completed Specimen: Blood, Venipuncture Updated: 12/28/22 0425    Narrative:  The order will result in two separate 8-66ml bottles  Please do NOT order repeat blood cultures if one has been  drawn within the last 48 hours  UNLESS concerned for  endocarditis  AVOID BLOOD CULTURE DRAWS FROM CENTRAL LINE IF POSSIBLE  Indications:->Pneumonia  ORDER#: [id number]                                    ORDERED BY: Vear Clock  SOURCE: Blood, Venipuncture                          COLLECTED:  12/22/22 22:39  ANTIBIOTICS AT COLL.:                                RECEIVED :  12/23/22 02:00  Culture Blood Aerobic and Anaerobic        FINAL       12/28/22 04:25  12/28/22   No growth after 5 days of incubation.      Culture Blood Aerobic and Anaerobic [id number] Collected: 12/22/22 2239    Order Status: Completed Specimen: Blood, Venipuncture Updated: 12/28/22 0425    Narrative:      The order will result in two separate 8-15ml bottles  Please do NOT order repeat blood cultures if one has been  drawn within the last 48 hours  UNLESS concerned for  endocarditis  AVOID BLOOD CULTURE DRAWS FROM CENTRAL LINE IF POSSIBLE  Indications:->Pneumonia  ORDER#: [id number]                                    ORDERED BY: Vear Clock  SOURCE: Blood, Venipuncture                          COLLECTED:  12/22/22 22:39  ANTIBIOTICS AT COLL.:                                RECEIVED :  12/23/22 01:59  Culture Blood Aerobic and Anaerobic        FINAL       12/28/22 04:25  12/28/22   No growth after 5 days of incubation.      Respiratory Pathogen Panel w/COVID-19, PCR [id number]     Order Status: Canceled     COVID-19  (SARS-CoV-2) and Influenza A/B, NAA (Liat Rapid)- Admission [id number] Collected: 12/21/22 1050    Order Status: Completed Specimen: Culturette from Nasopharyngeal Updated: 12/21/22 1144     Purpose of COVID testing Diagnostic -PUI     SARS-CoV-2 Specimen Source Nasal Swab     SARS CoV 2 Overall Result Not Detected     Comment: __________________________________________________  -A result of "Detected" indicates POSITIVE for the    presence of SARS CoV-2 RNA  -A result of "Not Detected" indicates NEGATIVE for the    presence of SARS CoV-2 RNA  __________________________________________________________  Test performed using the Roche cobas Liat SARS-CoV-2 assay. This assay is  only for use under the Food and Drug Administration's Emergency Use  Authorization. This is a real-time RT-PCR assay for the qualitative  detection of SARS-CoV-2 RNA. Viral nucleic acids may persist in vivo,  independent of viability. Detection of viral  nucleic acid does not imply the  presence of infectious virus, or that virus nucleic acid is the cause of  clinical symptoms. Negative results do not preclude SARS-CoV-2 infection and  should not be used as the sole basis for diagnosis, treatment or other  patient management decisions. Negative results must be combined with  clinical observations, patient history, and/or epidemiological information.  Invalid results may be due to inhibiting substances in the specimen and  recollection should occur. Please see Fact Sheets for patients and providers  located:  https://www.benson-chung.com/          Influenza A Not Detected     Influenza B Not Detected     Comment: Test performed using the Roche cobas Liat SARS-CoV-2 & Influenza A/B assay.  This assay is only for use under the Food and Drug Administration's  Emergency Use Authorization. This is a multiplex real-time RT-PCR assay  intended for the simultaneous in vitro qualitative detection and  differentiation of SARS-CoV-2,  influenza A, and influenza B virus RNA. Viral  nucleic acids may persist in vivo, independent of viability. Detection of  viral nucleic acid does not imply the presence of infectious virus, or that  virus nucleic acid is the cause of clinical symptoms. Negative results do  not preclude SARS-CoV-2, influenza A, and/or influenza B infection and  should not be used as the sole basis for diagnosis, treatment or other  patient management decisions. Negative results must be combined with  clinical observations, patient history, and/or epidemiological information.  Invalid results may be due to inhibiting substances in the specimen and  recollection should occur. Please see Fact Sheets for patients and providers  located: http://olson-hall.info/.         Narrative:      o Collect and clearly label specimen type:  o PREFERRED-Upper respiratory specimen: One Nasal Swab in  Transport Media.  o Hand deliver to laboratory ASAP  Diagnostic -PUI    Respiratory Pathogen Panel w/COVID-19, PCR [id number] Collected: 12/21/22 0106    Order Status: Canceled Specimen: Nasopharyngeal     Culture Blood Aerobic and Anaerobic [id number] Collected: 12/21/22 0021    Order Status: Completed Specimen: Blood, Venipuncture Updated: 12/26/22 [id number]    Narrative:      Indications:->Other  Other->infection  ORDER#: [id number]                                    ORDERED BY: LEE, SIEW MEI  SOURCE: Blood, Venipuncture RAC                      COLLECTED:  12/21/22 00:21  ANTIBIOTICS AT COLL.:                                RECEIVED :  12/21/22 04:06  Culture Blood Aerobic and Anaerobic        FINAL       12/26/22 06:37  12/26/22   No growth after 5 days of incubation.      Culture Blood Aerobic and Anaerobic [id number] Collected: 12/21/22 0021    Order Status: Completed Specimen: Blood, Venipuncture Updated: 12/26/22 1147    Narrative:      [id number] called Micro Results of Pos Bld. Results read back [id number], by [id number]  on 12/22/2022 at  07:29  Indications:->Other  Other->infection  ORDER#: [id number]  ORDERED BY: LEE, SIEW MEI  SOURCE: Blood, Venipuncture LAC                      COLLECTED:  12/21/22 00:21  ANTIBIOTICS AT COLL.:                                RECEIVED :  12/21/22 04:06  [id number] called Micro Results of Pos Bld. Results read back by:[id number], by [phone] on 12/22/2022 at 07:29  Culture Blood Aerobic and Anaerobic        FINAL       12/26/22 11:47   +  12/22/22   Aerobic Blood Culture Positive in 24 to 48 hours             Gram Stain Shows: Gram positive cocci in clusters  12/26/22   Anaerobic Blood Culture No Growth  12/23/22   Growth of Staphylococcus (coagulase negative)               Possible skin contaminant, susceptibility testing not             performed without request unless additional blood cultures,             collected within 48 hours of this culture, become positive.             Contact the laboratory for further information if necessary.        MRSA culture [id number] Collected: 12/21/22 0000    Order Status: Canceled Specimen: Culturette from Nasal Swab     MRSA culture [id number] Collected: 12/21/22 0000    Order Status: Canceled Specimen: Culturette from Throat             Imaging, reviewed and are significant for:  XR Chest AP Portable    Result Date: 12/31/2022  Cardiomegaly. No pulmonary arterial catheter identified. Montine Circle, MD 12/31/2022 9:47 AM    XR Chest AP Portable    Result Date: 12/30/2022  Pulmonary arterial catheter with tip projecting over the right hilum. Montine Circle, MD 12/30/2022 6:38 PM    XR Chest AP Portable    Result Date: 12/28/2022  Support hardware as above. No new acute findings. Montine Circle, MD 12/28/2022 7:36 AM    XR Chest AP Portable    Result Date: 12/27/2022   Stable positioning of right IJ PA catheter projecting over the right lower lobar pulmonary artery. Blanchard Mane 12/27/2022 11:45 PM        Signed by: Gillis Santa, MD

## 2023-01-02 NOTE — Progress Notes (Signed)
[facility].  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, known CLL    - cardiogenic shock, acute CHF EF 15, severe TR, improving    - Dm2  - anemia macrocytic and thrombocytopenia  - hyponatremia, hypervolemic    Recommendations/Plan:  Cr slightly improved to 3.8 amidst large diuresis  - weight 185 --> 186 today, continues on torsemide 80 bid   - No acute need for HD/RRT   - trend BMP, strict ins and outs  - continues on hydralazine and isordil  - daily standing scale weights  - he plans to follow up with Dr Kalman Shan on .  He will be high risk for further AKI given her advanced heart disease.          Hassie Esten, MD  Epic Chat Direct (preferred) or  Epic Group: [facility]  [facility]: [phone],   Office: [phone],     ++++++++++++++++++++++++++++++++++++++++++++++++++++++++++++++    Subjective:    off O2 this am, walking around with PT  UOP 2 L         Medications:  Scheduled Meds:  Current Facility-Administered Medications   Medication Dose Route Frequency    amiodarone  200 mg Oral Daily    aspirin EC  81 mg Oral Daily    atorvastatin  40 mg Oral QHS    chlorothiazide  1,000 mg Intravenous BID    heparin (porcine)  5,000 Units Subcutaneous Q8H [place]    hydrALAZINE  100 mg Oral Q8H [place]    insulin glargine  33 Units Subcutaneous Q12H    Or    insulin glargine  15 Units Subcutaneous Q12H    insulin lispro  1-6 Units Subcutaneous QHS    insulin lispro  10 Units Subcutaneous TID AC    insulin lispro  2-10 Units Subcutaneous TID AC    isosorbide dinitrate  40 mg Oral TID - Nitrate Free Interval    niacin  500 mg Oral BID    polyethylene glycol  17 g Oral Daily    senna-docusate  1 tablet Oral Q12H SCH    torsemide  80 mg Oral BID     Continuous Infusions:      PRN Meds:acetaminophen, dextrose **OR** dextrose **OR** dextrose  **OR** glucagon (rDNA), melatonin, phenol, simethicone    Objective:  Vital signs in last 24 hours:  Temp:  [97.3 F (36.3 C)-98.1 F (36.7 C)] 98.1 F (36.7 C)  Heart Rate:  [74-84] 84  Resp Rate:  [17-20] 18  BP: (107-116)/(62-71) 116/71  Intake/Output last 24 hours:    Intake/Output Summary (Last 24 hours) at 01/02/2023 0759  Last data filed at 01/02/2023 0320  Gross per 24 hour   Intake 450 ml   Output 2050 ml   Net -1600 ml     Intake/Output this shift:  No intake/output data recorded.    Physical Exam:   Gen: No acute distress   HEENT: normocephalic   CV: S1 S2 No rub   Chest: improving bilaterally   Ab: Nondistended,     Ext: + leg edema improving    Labs:  Recent Labs   Lab 01/02/23  0518 01/01/23  2006 01/01/23  0421 12/31/22  1023 12/31/22  0357   Glucose 57* 229* 159*  More results in Results Review 105*   BUN 96.0* 99.0* 83.0*  More results in Results Review 84.0*   Creatinine 3.8* 4.1* 4.2*  More results in Results Review 4.2*   Calcium 9.7 9.8 9.5  More results in Results Review 9.6   Sodium 133* 133* 133*  More results in Results Review 134*   Potassium 4.2 4.6 4.3  More results in Results Review 3.8   Chloride 93* 91* 94*  More results in Results Review 95*   CO2 27 26 24   More results in Results Review 22   Albumin 3.5  --  3.4*  --  3.4*   Magnesium 2.2 2.3 2.3  More results in Results Review 2.2   More results in Results Review = values in this interval not displayed.     Recent Labs   Lab 01/02/23  0518 01/01/23  0421 12/31/22  0357   WBC 12.95* 10.26* 9.55*   Hgb 10.3* 9.9* 9.3*   Hematocrit 31.5* 30.2* 27.6*   MCV 101.6* 100.0* 98.2*   MCH 33.2 32.8 33.1   MCHC 32.7 32.8 33.7   RDW 17* 17* 17*   MPV 11.4 11.6 11.6   Platelets 156 125* 113*

## 2023-01-03 LAB — BASIC METABOLIC PANEL
Anion Gap: 15 (ref 5.0–15.0)
BUN: 102 mg/dL — ABNORMAL HIGH (ref 9.0–28.0)
CO2: 24 mEq/L (ref 17–29)
Calcium: 9.8 mg/dL (ref 7.9–10.2)
Chloride: 92 mEq/L — ABNORMAL LOW (ref 99–111)
Creatinine: 3.9 mg/dL — ABNORMAL HIGH (ref 0.5–1.5)
Glucose: 114 mg/dL — ABNORMAL HIGH (ref 70–100)
Potassium: 4.3 mEq/L (ref 3.5–5.3)
Sodium: 131 mEq/L — ABNORMAL LOW (ref 135–145)
eGFR: 15.2 mL/min/{1.73_m2} — AB (ref >=60)

## 2023-01-03 LAB — CBC AND DIFFERENTIAL
Absolute NRBC: 0 10*3/uL (ref 0.00–0.00)
Basophils Absolute Automated: 0.02 10*3/uL (ref 0.00–0.08)
Basophils Automated: 0.2 %
Eosinophils Absolute Automated: 0.14 10*3/uL (ref 0.00–0.44)
Eosinophils Automated: 1.3 %
Hematocrit: 32.9 % — ABNORMAL LOW (ref 37.6–49.6)
Hgb: 11.1 g/dL — ABNORMAL LOW (ref 12.5–17.1)
Immature Granulocytes Absolute: 0.04 10*3/uL (ref 0.00–0.07)
Immature Granulocytes: 0.4 %
Instrument Absolute Neutrophil Count: 5.05 10*3/uL (ref 1.10–6.33)
Lymphocytes Absolute Automated: 4.26 10*3/uL — ABNORMAL HIGH (ref 0.42–3.22)
Lymphocytes Automated: 38.7 %
MCH: 33.9 pg — ABNORMAL HIGH (ref 25.1–33.5)
MCHC: 33.7 g/dL (ref 31.5–35.8)
MCV: 100.6 fL — ABNORMAL HIGH (ref 78.0–96.0)
MPV: 11.4 fL (ref 8.9–12.5)
Monocytes Absolute Automated: 1.5 10*3/uL — ABNORMAL HIGH (ref 0.21–0.85)
Monocytes: 13.6 %
Neutrophils Absolute: 5.05 10*3/uL (ref 1.10–6.33)
Neutrophils: 45.8 %
Nucleated RBC: 0 /100 WBC (ref 0.0–0.0)
Platelets: 163 10*3/uL (ref 142–346)
RBC: 3.27 10*6/uL — ABNORMAL LOW (ref 4.20–5.90)
RDW: 18 % — ABNORMAL HIGH (ref 11–15)
WBC: 11.01 10*3/uL — ABNORMAL HIGH (ref 3.10–9.50)

## 2023-01-03 LAB — HEPATIC FUNCTION PANEL (LFT)
ALT: 31 U/L (ref 0–55)
AST (SGOT): 23 U/L (ref 5–41)
Albumin/Globulin Ratio: 1.1 (ref 0.9–2.2)
Albumin: 3.7 g/dL (ref 3.5–5.0)
Alkaline Phosphatase: 94 U/L (ref 37–117)
Bilirubin Direct: 0.7 mg/dL — ABNORMAL HIGH (ref 0.0–0.5)
Bilirubin Indirect: 0.8 mg/dL (ref 0.2–1.0)
Bilirubin, Total: 1.5 mg/dL — ABNORMAL HIGH (ref 0.2–1.2)
Globulin: 3.5 g/dL (ref 2.0–3.6)
Protein, Total: 7.2 g/dL (ref 6.0–8.3)

## 2023-01-03 LAB — WHOLE BLOOD GLUCOSE POCT
Whole Blood Glucose POCT: 181 mg/dL — ABNORMAL HIGH (ref 70–100)
Whole Blood Glucose POCT: 198 mg/dL — ABNORMAL HIGH (ref 70–100)
Whole Blood Glucose POCT: 177 mg/dL — ABNORMAL HIGH (ref 70–100)
Whole Blood Glucose POCT: 213 mg/dL — ABNORMAL HIGH (ref 70–100)

## 2023-01-03 LAB — MAGNESIUM: Magnesium: 2.1 mg/dL (ref 1.6–2.6)

## 2023-01-03 NOTE — Plan of Care (Addendum)
Shift Note: 01/03/23     Orientation: AOx 4  Rhythm on tele: V-paced  Oxygen: RA  Ambulation: SBA w/ walker  Pain: Tylenol given for HA with good affect  Lines/Drips: 18 LAC, 22 LFA, R IJ tunneled Hohn  GI/GU: Cont x 2, EMC, LBM 3/17  Fall Score: Mod    Critical Labs/Imaging/Procedures:   3/4 Tx to CICU for dobutamine.   3/4 echo EF 11%, mod pHTN, mod dialated LV, severe mitral & tricuspid regurg.   3/5 RHC for swan   3/5 Permacath placed.   3/13 dobutamine d/c'd, bumex gtt.   3/14 CXR: cardiomegaly.   3/14 not a candidate for LVAD, palliative consulted.   3/14 downgraded to CTUS from CICU    Comments:    - See doc flow for complete assessment and vitals.     Significant Shift Events and Questions for Attending:    Plan:   - Vitals Q4H   - Torsemide; I/O, DW  - Mon WBC and Cr  - Not a candidate for LVAD  - D/C home pending Hohn removal with IR tomorrow       Braden Score: 20

## 2023-01-03 NOTE — Progress Notes (Addendum)
[place] NOTE    [facility]  [place] [address]  Dept Fax: [phone]  Loc: [phone]    Date Time: 01/03/23 9:11 AM  Patient Name: [name] [name]: 14    Subjective/Cardiac Relevant Events:   Denies dyspnea or chest pain  Transferred out of the CICU  Not a candidate for advanced therapies as per advanced heart failure inpatient  Had been on Dobutrex drip, stopped few days ago  Systolic blood pressures 0000000  Creatinine 3.9 today, up from 3.8 yesterday; was 4.3 earlier in admission    Intake/Output Summary (Last 24 hours) at 01/03/2023 [id number]  Last data filed at 01/03/2023 0900  Gross per 24 hour   Intake 1547 ml   Output 3990 ml   Net -2443 ml     Weight 185 pounds on standing scale today, down 1 pound since yesterday    ASSESSMENT:   Patient is a 77 y.o. 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 Kalman Shan as outpatient  Stage IV CLL, diagnosed in September 2021, 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 At this moment from a CLL standpoint, he is quite stable.  Right lower extremity DVT (5/23)   Type 2 diabetes with an A1c 5.9%  Stable COPD  Multiple hospital admissions for acute decompensated heart failure in 2023  DM2  Thrombocytopenia  Iron deficit  anemia   Iron studies (3/6): Iron sat 10, Ferritin 57  S/p Iron repletion (iron sucrose 200 mg x5 from 3/6 to 3/10)  Depression   Consult to psych   Aspiration event  Aspirated on 12/22/22, passed swallow eval thereafter          RECOMMENDATIONS:     Advanced heart failure and patient has signed off as he is not a candidate for advanced therapies or LVAD.  He can follow-up with Dr. Judeth Horn as an outpatient  Appears euvolemic. Continue torsemide dosing as per nephrology  Continue rest of CV meds with aspirin, statin, hydralazine/Isordil, amiodarone 200 mg daily (failed DCCV in CICU; nsvt last few days), and torsemide  Goals of care discussion and palliative care consulted  Long discussion re: HF education with salt and fluid restriction. He would benefit from further discussion with HF RN navigator tomorrow if here.  Discharge planning for possibly today v tomorrow.  I will order close follow-up with Dr. Judeth Horn and his NP, Clotilde Dieter  See below  Rexanne Mano, [place]:     Idelle Jo, MD, have seen and examined this patient. I have reviewed and edited the details outlined by my colleague Rexanne Mano, PA in this note as needed. I would add the following key elements of the patient's history, physical exam and plan after my personal  evaluation:       I have seen the patient and performed the substantive portion of the visit with my medical decision making.  I have reviewed and updated the documented findings and plan of care accordingly.    High risk patient for poor outcome, and not a candidate for advanced therapies.  But currently not dyspneic, no chest pain, no abd distension, and no edema.      On exam, vitals reviewed, NAD, OP MMM, anicteric sclera, neck supple, lungs coarse, heart distant, abd nt, min peripheral edema, alert and oriented, normal mood and affect    Darletta Moll, MD, [facility]          Telemetry/Labs Reviewed/Intake-Output:     Telemetry reviewed: VP 80s                 Recent Labs   Lab 01/03/23  0408   Bilirubin, Total 1.5*   Bilirubin Direct 0.7*   Protein, Total 7.2   Albumin 3.7   ALT 31   AST (SGOT) 23     Recent Labs   Lab 01/03/23  0408   Magnesium 2.1         Recent Labs   Lab 01/03/23  0408 01/02/23  1345 01/02/23  0518   WBC 11.01* 11.29* 12.95*   Hgb 11.1* 10.8* 10.3*   Hematocrit 32.9* 32.3* 31.5*   Platelets 163 157 156     Recent Labs   Lab 01/03/23  0408 01/02/23  1345 01/02/23  0518   Sodium 131* 129* 133*   Potassium 4.3 4.7 4.2   Chloride 92* 90* 93*   CO2 24 23 27    BUN 102.0* 97.0* 96.0*   Creatinine 3.9* 3.8* 3.8*   eGFR 15.2* 15.7* 15.7*   Glucose 114* 217* 57*   Calcium 9.8 9.7 9.7           Invalid input(s): "FREET4"    .  Lab Results   Component Value Date    BNP 9,403 (H) 12/26/2022          Intake/Output Summary (Last 24 hours) at 01/03/2023 0911  Last data filed at 01/03/2023 0620  Gross per 24 hour   Intake 1307 ml   Output 3990 ml   Net -2683 ml       Medications:      Scheduled Meds: PRN Meds:    amiodarone, 200 mg, Oral, Daily  aspirin EC, 81 mg, Oral, Daily  atorvastatin, 40 mg, Oral, QHS  heparin (porcine), 5,000 Units, Subcutaneous, Q8H SCH  hydrALAZINE, 100 mg, Oral, Q8H SCH  insulin glargine, 33 Units, Subcutaneous, Q12H   Or  insulin glargine, 15 Units, Subcutaneous, Q12H  insulin lispro, 1-6 Units, Subcutaneous, QHS  insulin lispro, 10 Units, Subcutaneous, TID AC  insulin lispro, 2-10 Units, Subcutaneous, TID AC  isosorbide dinitrate, 40 mg, Oral, TID - Nitrate Free Interval  niacin, 500 mg, Oral, BID  polyethylene glycol, 17 g, Oral, Daily  senna-docusate, 1 tablet, Oral, Q12H SCH  torsemide, 80 mg, Oral, BID        Continuous Infusions:   acetaminophen, 650 mg, Q6H PRN  dextrose, 15 g of glucose, PRN   Or  dextrose, 12.5 g, PRN   Or  dextrose, 12.5 g, PRN   Or  glucagon (rDNA), 1 mg, PRN  melatonin, 6 mg, QHS PRN  phenol, 1 spray, Q2H PRN  simethicone, 80 mg, Q6H PRN          Physical Exam:  Vitals:    01/03/23 0420 01/03/23 0620  01/03/23 0646 01/03/23 0816   BP: 114/72  114/72 106/54   Pulse: 80      Resp: 17   16   Temp: 97.8 F (36.6 C)   97.8 F (36.6 C)   TempSrc: Oral   Oral   SpO2: 98%   96%   Weight:  84 kg (185 lb 3.2 oz)     Height:             12/30/2022     4:08 AM 12/30/2022    10:53 AM 12/30/2022     4:21 PM 12/31/2022     5:56 AM 01/01/2023     7:16 AM 01/02/2023     8:25 AM 01/03/2023     6:20 AM   Weight Monitoring   Height 182.9 cm 182.9 cm 182.9 cm       Weight    86.2 kg 84.142 kg 84.369 kg 84.006 kg   Weight Method     Standing Scale Standing Scale Standing Scale   BMI (calculated)    25.8 kg/m2 25.2 kg/m2 25.2 kg/m2 25.1 kg/m2       Constitutional: Cooperative, alert, no acute distress.  Neck: No carotid bruits, JVP normal.  Cardiac: Regular rate and rhythm, normal S1 and S2; no S3 or S4, no murmurs, no rubs, no gallops.  Pulmonary: Clear to auscultation bilaterally, no wheezing, no rhonchi, no rales.  Extremities: no edema. warm  Vascular: +2 pulses in radial artery bilaterally, 2+ pedal pulses bilaterally.    -------------------------------------------------------------------------------------  Signed by:         Jeri Modena, [facility]  Secure [place] (Group):   [facility]    APP [place]:  [phone]    MD [place] :  [id number]    After hours, non urgent consult line:  [phone]    After hours, physician on-call:  [place] (Group):   [facility]    APP [place]:  [phone]    MD [place] :  [phone]      After hours, non urgent consult line:  [phone]    After hours, physician on-call:  [place] (Group):   [facility]    APP [place]:  [phone]    MD [place] :  [phone]      After hours, non urgent consult line:  [phone]    After hours, physician on-call:  [place] (Group):   [facility]    APP [place]:  [phone]    MD [place] :  [phone]      After hours, non urgent consult line:  [phone]    After hours, physician on-call:  [phone]       This note was generated by the Dragon speech recognition and may contain errors or omissions not intended by the user. Grammatical errors, random word insertions, deletions, pronoun errors, and incomplete sentences are occasional consequences of this technology due to software limitations. Not all errors are caught or corrected. If there are questions or concerns about the content of this note or information contained within the body of this dictation, they should be addressed directly with the author for clarification.

## 2023-01-03 NOTE — Progress Notes (Addendum)
MEDICINE PROGRESS NOTE    Date Time: 01/03/23 12:32 PM  Patient Name: [name] [name]  Attending Physician: Lin Landsman, MD    Assessment:   77 y.o. 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 Subsequent readmission to CICU on 3/10 for worsening renal function and volume overload. Now off dobutamine as of 3/13 with swan removed. Status post bumex gtt transitioned to torsemide 80mg  po bid on 3/15, and IV diuril d/c'd 3/16. Evaluated for LVAD and pt not a candidate given poor renal function.     Plan:   #Acute decompensated systolic CHF with Cardiogenic shock /Ischemic cardiomyopathy  EF 11%, NYHA Class 4, S/p Medtronic BiV ICD, h/o MR, TR  - s/p dobutamine gtt (d/c'd 3/13)  - s/p bumex gtt and IV diuril  - continue on torsemide 80mg  po bid, Cr 3.9 today, no acute need for HD/RRT, okay for d/c home on torsemide 80mg  po bid and repeat BMP later this week  -strict I/Os, daily weights  -fluid restriction, low salt diet  -monitor electrolytes keep K>4 and Mg>2  -GDMT limited by renal dysfunction, no beta blocker 2/2 cardiogenic shock; continue hydralazine and isordil  -not on oxygen supplementation  -hemodynamically stable, SBP 90s-100s  -cardiology on board, recommend HF education by HF RN navigator (will be available tomorrow), recommend outpatient follow up with Dr. Judeth Horn outpatient  -Hohn catheter no longer indicated, plan for removal by IR tomorrow    #Narrow complex supraventricular tachycardia, s/p unsuccessful cardioversion 12/21/22  - continue amiodarone 200mg  po daily    # Hyperlipidemia  -continue lipitor 40mg  po qhs  -continue niacin  500mg  po bid    #Hypertension  -continue hydralazine 100mg  po q8h  -continue isordil 40mg  po tid    #AKI on CKD stage IV  - Cr 3.9 today, weight 185lbs, nephrology recommends continuing torsemide 80mg  bid, okay for d/c with BMP later this week and close cardiology f/u on d/c  -outpatient follow up with Dr. Kalman Shan  -patient needs to call cardiology and renal if weight goes over 188lbs    #Stage IV CLL, diagnosed in September 2021  -started acalabrutinib (Calquence) in July 2023  -Seen by oncology ([facility]). Hold the Calquence as it does have an anticoagulant effect and may contribute to arrhythmia.   -Continue to hold upon discharge until follows up with Dr. Rolanda Lundborg    #Right lower extremity DVT (02/2022)   -resume home Eliquis upon discharge    #Type 2 diabetes with an A1c 5.9%  - continue lantus 33 units q12h  - continue lispro 10 units tidac  - continue HDISS ACHS  - glucose goal 140-180     #COPD, no acute exacerbation    #Thrombocytopenia, resolved    #Iron deficit anemia   - Iron studies (3/6): Iron sat 10, Ferritin 57  - S/p Iron repletion (iron sucrose 200 mg x5 from 3/6 to 3/10)    # Depression   -Consult to psych     #GOC  -Multiple hospital admissions for acute decompensated heart failure in 2023  - palliative care consulted as pt seems to be  hospice appropriate, but pt is declining hospice    #DVT Ppx  -heparin sq    Dispo - Hohn catheter removal in am by IR, okay for d/c home as per cardiology and nephrology, will need close outpatient f/u    Additional Diagnoses:             Safety Checklist:     DVT prophylaxis:  CHEST guideline (See page [id number]) Chemical   Foley:  [place] Rn Foley protocol Not present   IVs:  Peripheral IV   PT/OT: Ordered   Daily CBC & or Chem ordered:  SHM/ABIM guidelines (see #5) No   Reference for approximate charges of common labs: CBC auto diff - $76  BMP - $99  Mg - $79    Lines:     Patient Lines/Drains/Airways Status       Active PICC Line / CVC Line / PIV Line / Drain / Airway /  Intraosseous Line / Epidural Line / ART Line / Line / Wound / Pressure Ulcer / NG/OG Tube       Name Placement date Placement time Site Days    Implanted Catheter 12/25/22 Hohn Tunneled Right Internal jugular 12/25/22  1500  Internal jugular  6    Peripheral IV 12/21/22 18 G Left Antecubital 12/21/22  2200  Antecubital  10    Peripheral IV 12/26/22 22 G Anterior;Left Forearm 12/26/22  1143  Forearm  5    External Urinary Catheter 12/24/22  2000  --  7                     Disposition: (Please see PAF column for Expected D/C Date)   Today's date: 01/03/2023  Admit Date: 12/21/2022  9:55 PM  LOS: 13  Clinical Milestones: Hohn catheter removal  Anticipated discharge needs: none    Subjective     CC: Heart failure    Interval History/24 hour events: No acute events overnight. Patient resting comfortably in bed, denies chest pain or shortness of breath. On RA.     Review of Systems:     As per HPI    Physical Exam:     VITAL SIGNS PHYSICAL EXAM   Temp:  [97.3 F (36.3 C)-97.9 F (36.6 C)] 97.8 F (36.6 C)  Heart Rate:  [78-84] 84  Resp Rate:  [16-17] 16  BP: (96-120)/(53-73) 102/66    Telemetry:  V paced      Intake/Output Summary (Last 24 hours) at 01/03/2023 1232  Last data filed at 01/03/2023 1156  Gross per 24 hour   Intake 1315 ml   Output 3400 ml   Net -2085 ml    Physical Exam  General: awake, alert X 4  Cardiovascular: regular rate and rhythm, holosystolic murmurs, rubs or gallops  Lungs: clear to auscultation bilaterally, without wheezing, rhonchi, or rales  Abdomen: soft, non-tender, non-distended; no palpable masses,  normoactive bowel sounds  Extremities: no edema       Meds:     Medications were reviewed:  Current Facility-Administered Medications   Medication Dose Route Frequency    amiodarone  200 mg Oral Daily    aspirin EC  81 mg Oral Daily    atorvastatin  40 mg Oral QHS    heparin (porcine)  5,000 Units Subcutaneous Q8H [place]    hydrALAZINE  100 mg Oral Q8H [place]    insulin glargine  33 Units Subcutaneous  Q12H    Or    insulin glargine  15 Units Subcutaneous  Q12H    insulin lispro  1-6 Units Subcutaneous QHS    insulin lispro  10 Units Subcutaneous TID AC    insulin lispro  2-10 Units Subcutaneous TID AC    isosorbide dinitrate  40 mg Oral TID - Nitrate Free Interval    niacin  500 mg Oral BID    polyethylene glycol  17 g Oral Daily    senna-docusate  1 tablet Oral Q12H SCH    torsemide  80 mg Oral BID     Current Facility-Administered Medications   Medication Dose Route Frequency Last Rate     Current Facility-Administered Medications   Medication Dose Route    acetaminophen  650 mg Oral    dextrose  15 g of glucose Oral    Or    dextrose  12.5 g Intravenous    Or    dextrose  12.5 g Intravenous    Or    glucagon (rDNA)  1 mg Intramuscular    melatonin  6 mg Oral    phenol  1 spray Oral    simethicone  80 mg Oral         Labs:     Labs (last 72 hours):    Recent Labs   Lab 01/03/23  0408 01/02/23  1345   WBC 11.01* 11.29*   Hgb 11.1* 10.8*   Hematocrit 32.9* 32.3*   Platelets 163 157          Recent Labs   Lab 01/03/23  0408 01/02/23  1345 01/02/23  0518   Sodium 131* 129* 133*   Potassium 4.3 4.7 4.2   Chloride 92* 90* 93*   CO2 24 23 27    BUN 102.0* 97.0* 96.0*   Creatinine 3.9* 3.8* 3.8*   Calcium 9.8 9.7 9.7   Albumin 3.7  --  3.5   Protein, Total 7.2  --  7.0   Bilirubin, Total 1.5*  --  1.5*   Alkaline Phosphatase 94  --  86   ALT 31  --  36   AST (SGOT) 23  --  20   Glucose 114* 217* 57*                   Microbiology, reviewed and are significant for:  Microbiology Results (last 15 days)       Procedure Component Value Units Date/Time    MRSA culture [id number] Collected: 12/23/22 1200    Order Status: Completed Specimen: Culturette from Nasal Swab Updated: 12/24/22 1311     Culture MRSA Surveillance Negative for Methicillin Resistant Staph aureus    MRSA culture [id number] Collected: 12/23/22 1200    Order Status: Completed Specimen: Culturette from Throat Updated: 12/24/22 1311     Culture MRSA  Surveillance Negative for Methicillin Resistant Staph aureus    Culture Blood Aerobic and Anaerobic [id number] Collected: 12/22/22 2239    Order Status: Completed Specimen: Blood, Venipuncture Updated: 12/28/22 0425    Narrative:      The order will result in two separate 8-79ml bottles  Please do NOT order repeat blood cultures if one has been  drawn within the last 48 hours  UNLESS concerned for  endocarditis  AVOID BLOOD CULTURE DRAWS FROM CENTRAL LINE IF POSSIBLE  Indications:->Pneumonia  ORDER#: [id number]                                    ORDERED BY: Vear Clock  SOURCE: Blood, Venipuncture                          COLLECTED:  12/22/22 22:39  ANTIBIOTICS AT COLL.:                                RECEIVED :  12/23/22 02:00  Culture Blood Aerobic and Anaerobic        FINAL       12/28/22 04:25  12/28/22   No growth after 5 days of incubation.      Culture Blood Aerobic and Anaerobic [id number] Collected: 12/22/22 2239    Order Status: Completed Specimen: Blood, Venipuncture Updated: 12/28/22 0425    Narrative:      The order will result in two separate 8-68ml bottles  Please do NOT order repeat blood cultures if one has been  drawn within the last 48 hours  UNLESS concerned for  endocarditis  AVOID BLOOD CULTURE DRAWS FROM CENTRAL LINE IF POSSIBLE  Indications:->Pneumonia  ORDER#: [id number]                                    ORDERED BY: Vear Clock  SOURCE: Blood, Venipuncture                          COLLECTED:  12/22/22 22:39  ANTIBIOTICS AT COLL.:                                RECEIVED :  12/23/22 01:59  Culture Blood Aerobic and Anaerobic        FINAL       12/28/22 04:25  12/28/22   No growth after 5 days of incubation.      Respiratory Pathogen Panel w/COVID-19, PCR [id number]     Order Status: Canceled     COVID-19 (SARS-CoV-2) and Influenza A/B, NAA (Liat Rapid)- Admission [id number] Collected: 12/21/22 1050    Order Status: Completed Specimen: Culturette from Nasopharyngeal Updated: 12/21/22  1144     Purpose of COVID testing Diagnostic -PUI     SARS-CoV-2 Specimen Source Nasal Swab     SARS CoV 2 Overall Result Not Detected     Comment: __________________________________________________  -A result of "Detected" indicates POSITIVE for the    presence of SARS CoV-2 RNA  -A result of "Not Detected" indicates NEGATIVE for the    presence of SARS CoV-2 RNA  __________________________________________________________  Test performed using the Roche cobas Liat SARS-CoV-2 assay. This assay is  only for use under the Food and Drug Administration's Emergency Use  Authorization. This is a real-time RT-PCR assay for the qualitative  detection of SARS-CoV-2 RNA. Viral nucleic acids may persist in vivo,  independent of viability. Detection of viral nucleic acid does not imply the  presence of infectious virus, or that virus nucleic acid is the cause of  clinical symptoms. Negative results do not preclude SARS-CoV-2 infection and  should not be used as the sole basis for diagnosis, treatment or other  patient management decisions. Negative results must be combined with  clinical observations, patient history, and/or epidemiological information.  Invalid results may be due to inhibiting substances in the specimen and  recollection should occur. Please see Fact Sheets for patients and providers  located:  https://www.benson-chung.com/          Influenza A Not Detected     Influenza B Not Detected     Comment: Test performed using the Roche cobas Liat SARS-CoV-2 & Influenza A/B assay.  This assay is only for use under the Food and Drug Administration's  Emergency Use Authorization. This is a multiplex real-time RT-PCR assay  intended for the simultaneous in vitro qualitative detection and  differentiation of SARS-CoV-2, influenza A, and influenza B virus RNA. Viral  nucleic acids may persist in vivo, independent of viability. Detection of  viral nucleic acid does not imply the presence of infectious  virus, or that  virus nucleic acid is the cause of clinical symptoms. Negative results do  not preclude SARS-CoV-2, influenza A, and/or influenza B infection and  should not be used as the sole basis for diagnosis, treatment or other  patient management decisions. Negative results must be combined with  clinical observations, patient history, and/or epidemiological information.  Invalid results may be due to inhibiting substances in the specimen and  recollection should occur. Please see Fact Sheets for patients and providers  located: http://olson-hall.info/.         Narrative:      o Collect and clearly label specimen type:  o PREFERRED-Upper respiratory specimen: One Nasal Swab in  Transport Media.  o Hand deliver to laboratory ASAP  Diagnostic -PUI    Respiratory Pathogen Panel w/COVID-19, PCR [id number] Collected: 12/21/22 0106    Order Status: Canceled Specimen: Nasopharyngeal     Culture Blood Aerobic and Anaerobic [id number] Collected: 12/21/22 0021    Order Status: Completed Specimen: Blood, Venipuncture Updated: 12/26/22 [id number]    Narrative:      Indications:->Other  Other->infection  ORDER#: [id number]                                    ORDERED BY: LEE, SIEW MEI  SOURCE: Blood, Venipuncture RAC                      COLLECTED:  12/21/22 00:21  ANTIBIOTICS AT COLL.:                                RECEIVED :  12/21/22 04:06  Culture Blood Aerobic and Anaerobic        FINAL       12/26/22 06:37  12/26/22   No growth after 5 days of incubation.      Culture Blood Aerobic and Anaerobic [id number] Collected: 12/21/22 0021    Order Status: Completed Specimen: Blood, Venipuncture Updated: 12/26/22 1147    Narrative:      [id number] called Micro Results of Pos Bld. Results read back [id number], by [id number]  on 12/22/2022 at 07:29  Indications:->Other  Other->infection  ORDER#: [id number]                                    ORDERED BY: LEE, SIEW MEI  SOURCE: Blood, Venipuncture LAC                       COLLECTED:  12/21/22 00:21  ANTIBIOTICS AT COLL.:  RECEIVED :  12/21/22 04:06  [id number] called Micro Results of Pos Bld. Results read back [id number], by [phone] on 12/22/2022 at 07:29  Culture Blood Aerobic and Anaerobic        FINAL       12/26/22 11:47   +  12/22/22   Aerobic Blood Culture Positive in 24 to 48 hours             Gram Stain Shows: Gram positive cocci in clusters  12/26/22   Anaerobic Blood Culture No Growth  12/23/22   Growth of Staphylococcus (coagulase negative)               Possible skin contaminant, susceptibility testing not             performed without request unless additional blood cultures,             collected within 48 hours of this culture, become positive.             Contact the laboratory for further information if necessary.        MRSA culture [id number] Collected: 12/21/22 0000    Order Status: Canceled Specimen: Culturette from Nasal Swab     MRSA culture [id number] Collected: 12/21/22 0000    Order Status: Canceled Specimen: Culturette from Throat             Imaging, reviewed and are significant for:  XR Chest AP Portable    Result Date: 12/31/2022  Cardiomegaly. No pulmonary arterial catheter identified. Montine Circle, MD 12/31/2022 9:47 AM    XR Chest AP Portable    Result Date: 12/30/2022  Pulmonary arterial catheter with tip projecting over the right hilum. Montine Circle, MD 12/30/2022 6:38 PM    XR Chest AP Portable    Result Date: 12/28/2022  Support hardware as above. No new acute findings. Montine Circle, MD 12/28/2022 7:36 AM    XR Chest AP Portable    Result Date: 12/27/2022   Stable positioning of right IJ PA catheter projecting over the right lower lobar pulmonary artery. Blanchard Mane 12/27/2022 11:45 PM        Signed by: Lin Landsman, MD

## 2023-01-03 NOTE — Progress Notes (Signed)
[facility].  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 9 amidst large diuresis  - weight 185   today, continues on torsemide 80 bid off diuril  - No acute need for HD/RRT   - trend BMP, strict ins and outs  - continues on hydralazine and isordil  - daily standing scale weights  - he plans to follow up with Dr Kalman Shan on [place].  He will be high risk for further AKI given her advanced heart disease.   Needs to call cardiology and renal if weight goes over 188 lbs       Hassie Deunta, MD  Epic Chat Direct (preferred) or  Epic Group: [facility]  [facility] Spectra: [phone],   Office: [phone],     ++++++++++++++++++++++++++++++++++++++++++++++++++++++++++++++    Subjective:  Uop 3.9   Breathing stable, off o2       Medications:  Scheduled Meds:  Current Facility-Administered Medications   Medication Dose Route Frequency    amiodarone  200 mg Oral Daily    aspirin EC  81 mg Oral Daily    atorvastatin  40 mg Oral QHS    heparin (porcine)  5,000 Units Subcutaneous Q8H [place]    hydrALAZINE  100 mg Oral Q8H [place]    insulin glargine  33 Units Subcutaneous Q12H    Or    insulin glargine  15 Units Subcutaneous Q12H    insulin lispro  1-6 Units Subcutaneous QHS    insulin lispro  10 Units Subcutaneous TID AC    insulin lispro  2-10 Units Subcutaneous TID AC    isosorbide dinitrate  40 mg Oral TID - Nitrate Free Interval    niacin  500 mg Oral BID    polyethylene glycol  17 g Oral Daily    senna-docusate  1 tablet Oral Q12H SCH    torsemide  80 mg Oral BID     Continuous Infusions:      PRN Meds:acetaminophen, dextrose **OR** dextrose **OR**  dextrose **OR** glucagon (rDNA), melatonin, phenol, simethicone    Objective:  Vital signs in last 24 hours:  Temp:  [97.3 F (36.3 C)-97.9 F (36.6 C)] 97.8 F (36.6 C)  Heart Rate:  [78-81] 80  Resp Rate:  [16-19] 16  BP: (96-120)/(53-73) 106/54  Intake/Output last 24 hours:    Intake/Output Summary (Last 24 hours) at 01/03/2023 0846  Last data filed at 01/03/2023 0620  Gross per 24 hour   Intake 1543 ml   Output 3990 ml   Net -2447 ml     Intake/Output this shift:  No intake/output data recorded.    Physical Exam:   Gen: No acute distress   HEENT: normocephalic   CV: S1 S2 No rub   Chest: improving bilaterally   Ab: Nondistended,     Ext: + leg edema improving    Labs:  Recent Labs   Lab 01/03/23  0408 01/02/23  1345 01/02/23  0518 01/01/23  2006 01/01/23  0421   Glucose 114* 217* 57*  More results in Results  Review 159*   BUN 102.0* 97.0* 96.0*  More results in Results Review 83.0*   Creatinine 3.9* 3.8* 3.8*  More results in Results Review 4.2*   Calcium 9.8 9.7 9.7  More results in Results Review 9.5   Sodium 131* 129* 133*  More results in Results Review 133*   Potassium 4.3 4.7 4.2  More results in Results Review 4.3   Chloride 92* 90* 93*  More results in Results Review 94*   CO2 24 23 27   More results in Results Review 24   Albumin 3.7  --  3.5  --  3.4*   Magnesium 2.1 2.1 2.2  More results in Results Review 2.3   More results in Results Review = values in this interval not displayed.     Recent Labs   Lab 01/03/23  0408 01/02/23  1345 01/02/23  0518   WBC 11.01* 11.29* 12.95*   Hgb 11.1* 10.8* 10.3*   Hematocrit 32.9* 32.3* 31.5*   MCV 100.6* 99.7* 101.6*   MCH 33.9* 33.3 33.2   MCHC 33.7 33.4 32.7   RDW 18* 18* 17*   MPV 11.4 11.6 11.4   Platelets 163 157 156

## 2023-01-04 ENCOUNTER — Encounter
Admission: AD | Disposition: A | Discharge status: Home Health | Payer: Self-pay | Source: Other Acute Inpatient Hospital | Attending: Critical Care Medicine

## 2023-01-04 HISTORY — PX: TUNNELED CATH REMOVAL (PERMCATH): IMG2665

## 2023-01-04 LAB — BASIC METABOLIC PANEL
Anion Gap: 14 (ref 5.0–15.0)
BUN: 102 mg/dL — ABNORMAL HIGH (ref 9.0–28.0)
CO2: 24 mEq/L (ref 17–29)
Calcium: 9.6 mg/dL (ref 7.9–10.2)
Chloride: 94 mEq/L — ABNORMAL LOW (ref 99–111)
Creatinine: 4.1 mg/dL — ABNORMAL HIGH (ref 0.5–1.5)
Glucose: 198 mg/dL — ABNORMAL HIGH (ref 70–100)
Potassium: 4.5 mEq/L (ref 3.5–5.3)
Sodium: 132 mEq/L — ABNORMAL LOW (ref 135–145)
eGFR: 14.3 mL/min/{1.73_m2} — AB (ref >=60)

## 2023-01-04 LAB — MAGNESIUM: Magnesium: 2 mg/dL (ref 1.6–2.6)

## 2023-01-04 LAB — TYPE AND SCREEN
AB Screen Gel: NEGATIVE
ABO Rh: O POS

## 2023-01-04 LAB — CBC AND DIFFERENTIAL
Absolute NRBC: 0 10*3/uL (ref 0.00–0.00)
Basophils Absolute Automated: 0.02 10*3/uL (ref 0.00–0.08)
Basophils Automated: 0.2 %
Eosinophils Absolute Automated: 0.13 10*3/uL (ref 0.00–0.44)
Eosinophils Automated: 1.2 %
Hematocrit: 31.9 % — ABNORMAL LOW (ref 37.6–49.6)
Hgb: 10.5 g/dL — ABNORMAL LOW (ref 12.5–17.1)
Immature Granulocytes Absolute: 0.04 10*3/uL (ref 0.00–0.07)
Immature Granulocytes: 0.4 %
Instrument Absolute Neutrophil Count: 4.68 10*3/uL (ref 1.10–6.33)
Lymphocytes Absolute Automated: 4.63 10*3/uL — ABNORMAL HIGH (ref 0.42–3.22)
Lymphocytes Automated: 42.3 %
MCH: 33.8 pg — ABNORMAL HIGH (ref 25.1–33.5)
MCHC: 32.9 g/dL (ref 31.5–35.8)
MCV: 102.6 fL — ABNORMAL HIGH (ref 78.0–96.0)
MPV: 11 fL (ref 8.9–12.5)
Monocytes Absolute Automated: 1.45 10*3/uL — ABNORMAL HIGH (ref 0.21–0.85)
Monocytes: 13.2 %
Neutrophils Absolute: 4.68 10*3/uL (ref 1.10–6.33)
Neutrophils: 42.7 %
Nucleated RBC: 0 /100 WBC (ref 0.0–0.0)
Platelets: 182 10*3/uL (ref 142–346)
RBC: 3.11 10*6/uL — ABNORMAL LOW (ref 4.20–5.90)
RDW: 18 % — ABNORMAL HIGH (ref 11–15)
WBC: 10.95 10*3/uL — ABNORMAL HIGH (ref 3.10–9.50)

## 2023-01-04 LAB — WHOLE BLOOD GLUCOSE POCT
Whole Blood Glucose POCT: 150 mg/dL — ABNORMAL HIGH (ref 70–100)
Whole Blood Glucose POCT: 274 mg/dL — ABNORMAL HIGH (ref 70–100)

## 2023-01-04 SURGERY — TUNNELED CATH REMOVAL
Anesthesia: Anesthesia Choice | Laterality: Right

## 2023-01-04 MED ORDER — INSULIN LISPRO (1 UNIT DIAL) 100 UNIT/ML SC SOPN
10.0000 [IU] | PEN_INJECTOR | Freq: Three times a day (TID) | SUBCUTANEOUS | 0 refills | Status: DC
Start: 2023-01-04 — End: 2023-02-12

## 2023-01-04 MED ORDER — HYDRALAZINE HCL 100 MG PO TABS
100.0000 mg | ORAL_TABLET | Freq: Three times a day (TID) | ORAL | 0 refills | Status: DC
Start: 2023-01-04 — End: 2023-02-12

## 2023-01-04 MED ORDER — AMIODARONE HCL 200 MG PO TABS
200.0000 mg | ORAL_TABLET | Freq: Every day | ORAL | 0 refills | Status: AC
Start: 2023-01-04 — End: 2023-02-03

## 2023-01-04 MED ORDER — TORSEMIDE 40 MG PO TABS
80.0000 mg | ORAL_TABLET | Freq: Two times a day (BID) | ORAL | 0 refills | Status: DC
Start: 2023-01-04 — End: 2023-02-12

## 2023-01-04 MED ORDER — ISOSORBIDE DINITRATE 40 MG PO TABS
40.0000 mg | ORAL_TABLET | Freq: Three times a day (TID) | ORAL | 0 refills | Status: DC
Start: 2023-01-04 — End: 2023-01-21

## 2023-01-04 MED ORDER — LIDOCAINE HCL 1 % IJ SOLN
INTRAMUSCULAR | Status: AC
Start: 2023-01-04 — End: ?
  Filled 2023-01-04: qty 10

## 2023-01-04 MED ORDER — INSULIN GLARGINE (1 UNIT DIAL) 300 UNIT/ML SC SOPN
33.0000 [IU] | PEN_INJECTOR | Freq: Two times a day (BID) | SUBCUTANEOUS | 0 refills | Status: DC
Start: 2023-01-04 — End: 2023-01-08

## 2023-01-04 MED ORDER — INSULIN GLARGINE 100 UNIT/ML SC SOLN
33.0000 [IU] | Freq: Two times a day (BID) | SUBCUTANEOUS | Status: DC
Start: 2023-01-04 — End: 2023-01-04
  Administered 2023-01-04: 33 [IU] via SUBCUTANEOUS
  Filled 2023-01-04: qty 33

## 2023-01-04 NOTE — Plan of Care (Addendum)
Discharge Note:     Discharge location: home       Discharge instructions, follow up, and medications reviewed and explained to patient with wife at bedside - questions answered stated understanding.     Prescriptions: sent to pt's pharmacy in [place]    IV and tele removed. All belongings with patient. VSS.   Pt transported off of unit via 3 at PM.

## 2023-01-04 NOTE — OT Progress Note (Signed)
Occupational Therapy Treatment  [name] [name]        Post Acute Care Therapy Recommendations:     Discharge Recommendations:  Home with supervision    DME needs IF patient is discharging home: Shower chair    Therapy discharge recommendations may change with patient status.  Please refer to most recent note for up-to-date recommendations.    Assessment:   Significant Findings: none    Patient agreeable to OT and tolerated session well.  Pt received in bed and met OT goals this session.  Pt supervised for sitting EOB and when donning socks.  Supervised for sit<>stand, during toileting, and when washing hands at sink.  SBA during household mobility with RW, pt demonstrates good carryover of ECT and DME use education.  No ADL questions/concerns at end of session.  Positioned for comfort in chair and needs met.  Pt appears to be close to functional baseline at this time.  Will d/c OT services, please re-consult if pt's status changes.       Treatment Activities: ADLs, functional mobility/transfers, pt training     Educated the patient to role of occupational therapy, plan of care, goals of therapy and safety with mobility and ADLs, energy conservation techniques.    Plan:   OT Frequency Recommended: therapy discontinued     Goal Formulation: Patient  OT Plan  Risks/Benefits/POC Discussed with Pt/Family: With patient  Treatment Interventions: ADL retraining, Functional transfer training, UE strengthening/ROM, Endurance training, Patient/Family training, Equipment eval/education  Discharge Recommendation: Home with supervision  DME Recommended for Discharge: Shower chair  OT Frequency Recommended: therapy discontinued  OT - Next Visit Recommendation: 01/04/23    Discharge from [place].      [facility]  Bed: FI370/FI370-01       Precautions and Contraindications:   Precautions  Other Precautions: falls risk    Updated Medical Status/Imaging/Labs:  No results found.      Subjective: "I am going to be going to [place] soon."   Patient's medical condition is appropriate for Occupational Therapy intervention at this time.  Patient is agreeable to participation in the therapy session. Nursing clears patient for therapy.    Pain Assessment  Pain Assessment: No/denies pain    Objective:   Observation of Patient/Vital Signs:  Patient is in bed with telemetry and external catheter in place.  Pt wore mask during therapy session:No      Cognition/Neuro Status  Arousal/Alertness: Appropriate responses to stimuli  Attention Span: Appears intact  Orientation Level: Oriented X4  Memory: Appears intact  Following Commands: Follows all commands and directions without difficulty  Safety Awareness: independent  Insights: Fully aware of deficits  Problem Solving: Able to problem solve independently  Behavior: attentive;calm;cooperative    Functional Mobility  Supine to Sit Transfers: Supervision  Sit to Stand Transfers: Supervision  Stand to Sit Transfers: Supervision  Functional Mobility/Ambulation: Stand by Assist (with RW)    Self-care and Home Management  Grooming: Supervision;standing at sink;wash/dry hands  Toileting: Supervision    Balance  Static Sitting: Supervision  Dynamic Sitting: Supervision  Static Standing: Supervision for sit<>stand  Dynamic Standing: SBA with RW     PMP Activity: Step 6 - Walks in Room               Participation: good  Endurance: good      Patient left with call bell within reach, all needs met, SCDs off as found, fall mat in place, chair  alarm in place and all questions answered. RN notified of session outcome and patient response.       Goals:    Time For Goal Achievement: 5 visits  ADL Goals  Patient will groom self: Supervision, 5 visits, Goal met  Patient will dress upper body: Supervision, 5 visits, Goal met  Patient will dress lower body: Supervision, 5 visits, Goal met  Patient will toilet: Supervision, 5 visits, Goal met  Mobility and Transfer  Goals  Pt will perform functional transfers: Supervision, 5 visits, Goal met                           PPE worn during session: procedural mask and gloves    Tech present: n/a  PPE worn by tech: N/A      [name], OTR/L  Pager [phone]     Time of Treatment:   OT Received On: 01/04/23  Start Time: [id number]  Stop Time: 0840  Time Calculation (min): 25 min    Treatment # 5 of 5 visits

## 2023-01-04 NOTE — Plan of Care (Addendum)
Shift Note: 01/03/2023 CTUS PM   ?  Orientation: AOx4  Rhythm on tele: V-paced ?  Oxygen: RA  Ambulation: SBA w/ walker   Pain: Denied   Lines/Drips: 18 LAC, 22 LFA, R IJ tunneled Hohn   GI/GU: Cont. x2?LBM: 3/18  Fall Score: MOD   ?  Critical Labs/Imaging/Procedures:  3/4 Tx to CICU for dobutamine.   3/4 echo EF 11%, mod pHTN, mod dialated LV, severe mitral & tricuspid regurg.   3/5 RHC for swan   3/5 Permacath placed.   3/13 dobutamine d/c'd, bumex gtt.   3/14 CXR: cardiomegaly.   3/14 not a candidate for LVAD, palliative consulted.   3/14 downgraded to CTUS from CICU  ?  Comments:?  - See doc flow for complete assessment and vitals.   - MD placed NPO orders after bedtime insulin was given, notified overnight was told to give pt snack. Gave cereal and a salad.   ?  Plan:   - Vitals Q4H?  - Torsemide; I/O, DW  - Mon WBC and Cr  - Not a candidate for LVAD  - D/C home pending Hohn removal with IR tomorrow 3/18  ??  (Braden Score: 20)

## 2023-01-04 NOTE — Progress Notes (Signed)
[facility].  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  He will be high risk for further AKI given her advanced heart disease.   Needs to call cardiology and renal if weight goes over 188 lbs.  I reminded him to call DR Adlers office today to facilitate quick renal follow up       Hassie Audel, MD  Epic Chat Direct (preferred) or  Epic Group: [facility]  [facility] Spectra: [phone],   Office: [phone],     ++++++++++++++++++++++++++++++++++++++++++++++++++++++++++++++    Subjective:  Uop 2.4   Breathing stable, off o2       Medications:  Scheduled Meds:  Current Facility-Administered Medications   Medication Dose Route Frequency    amiodarone  200 mg Oral Daily    aspirin EC  81 mg Oral Daily    atorvastatin  40 mg Oral QHS    heparin (porcine)  5,000 Units Subcutaneous Q8H [place]    hydrALAZINE  100 mg Oral Q8H [place]    insulin glargine  33 Units Subcutaneous Q12H [place]    insulin lispro  1-6 Units Subcutaneous QHS    insulin lispro  10 Units Subcutaneous TID AC    insulin lispro  2-10 Units Subcutaneous TID AC    isosorbide dinitrate  40 mg Oral TID - Nitrate Free Interval    niacin  500 mg Oral BID    polyethylene glycol  17 g Oral Daily    senna-docusate  1 tablet Oral Q12H SCH    torsemide  80 mg Oral BID     Continuous Infusions:      PRN Meds:acetaminophen, dextrose **OR** dextrose **OR** dextrose **OR**  glucagon (rDNA), melatonin, phenol, simethicone    Objective:  Vital signs in last 24 hours:  Temp:  [97.3 F (36.3 C)-97.5 F (36.4 C)] 97.5 F (36.4 C)  Heart Rate:  [82-89] 89  Resp Rate:  [18-19] 18  BP: (104-123)/(62-78) 118/78  Intake/Output last 24 hours:    Intake/Output Summary (Last 24 hours) at 01/04/2023 1153  Last data filed at 01/04/2023 0549  Gross per 24 hour   Intake 802 ml   Output 2450 ml   Net -1648 ml     Intake/Output this shift:  No intake/output data recorded.    Physical Exam:   Gen: No acute distress   HEENT: normocephalic   CV: S1 S2 No rub   Chest: improving bilaterally   Ab: Nondistended,     Ext: + leg edema improving    Labs:  Recent Labs   Lab 01/04/23  0350 01/03/23  0408 01/02/23  1345 01/02/23  0518 01/01/23  2006 01/01/23  0421   Glucose 198* 114* 217* 57*  More results in Results Review 159*  BUN 102.0* 102.0* 97.0* 96.0*  More results in Results Review 83.0*   Creatinine 4.1* 3.9* 3.8* 3.8*  More results in Results Review 4.2*   Calcium 9.6 9.8 9.7 9.7  More results in Results Review 9.5   Sodium 132* 131* 129* 133*  More results in Results Review 133*   Potassium 4.5 4.3 4.7 4.2  More results in Results Review 4.3   Chloride 94* 92* 90* 93*  More results in Results Review 94*   CO2 24 24 23 27   More results in Results Review 24   Albumin  --  3.7  --  3.5  --  3.4*   Magnesium 2.0 2.1 2.1 2.2  More results in Results Review 2.3   More results in Results Review = values in this interval not displayed.     Recent Labs   Lab 01/04/23  0350 01/03/23  0408 01/02/23  1345   WBC 10.95* 11.01* 11.29*   Hgb 10.5* 11.1* 10.8*   Hematocrit 31.9* 32.9* 32.3*   MCV 102.6* 100.6* 99.7*   MCH 33.8* 33.9* 33.3   MCHC 32.9 33.7 33.4   RDW 18* 18* 18*   MPV 11.0 11.4 11.6   Platelets 182 163 157

## 2023-01-04 NOTE — PT Progress Note (Signed)
Physical Therapy Treatment  [name] [name]  Post Acute Care Therapy Recommendations:     Discharge Recommendations:  Home with home health PT, Home with supervision  D/C Milestones: Increase mobility Anticipate achievement in 1-2 sessions      DME needs IF patient is discharging home: Front wheel walker, Shower chair    Therapy discharge recommendations may change with patient status.  Please refer to most recent note for up-to-date recommendations.      Assessment:   Significant Findings: none     Patient received in bed agreeable to participate in therapy. Patient presents with decreased strength,endurance, balance all affecting functional mobility. Patient was SBA/CGA with RW for mobility-needed cues for hand placement with transfers and correct posture. Patient would benefit from continued therapy to maximize functional independence with mobility and safety.       Treatment Activities: Gait training, therex, safety, endurance training    Educated the patient to role of physical therapy, plan of care, goals of therapy and HEP, safety with mobility and ADLs, discharge instructions, home safety.    Plan:   PT Frequency: 2-3x/wk    Continue plan of care.    [facility]  Bed: FI370/FI370-01     Precautions and Contraindications:   Falls      Updated Medical Status/Imaging/Labs:Reviewed     Subjective:    "It was good to be up and walking"  Patient's medical condition is appropriate for Physical Therapy intervention at this time.  Patient is agreeable to participation in the therapy session.    Pain:   Denied      Objective:   Patient is in bed with IV access,tele in place.  Pt wore mask during therapy session:No      Cognition  Alert and Oriented x 4. Able to follow commands.    Functional Mobility  Rolling: NT  Supine to Sit: SBA  Scooting: SBA  Sit to Stand: SBA/CGA with RW-cues for hand placement  Stand to Sit: SBA/CGA  Transfers: SBA/CGA with RW    Ambulation  PMP - Progressive  Mobility Protocol   PMP Activity: Step 7 - Walks out of Room  Distance Walked (ft) (Step 6,7): 300 Feet (Chair to follow-2x seated rest breaks)     Level of Assistance required: SBA/CGA  Pattern: small steps, decrease cadence, flexed posture corrects with cues, cues for pacing  Device Used: RW      Balance  Static Sitting: Good  Dynamic Sitting: God  Static Standing: Good with RW  Dynamic Standing: Fair+w/RW    Therapeutic Exercises  AP's<LAq's<hip flexion: reviewed    Patient Participation: Good  Patient Endurance: Fair+    Patient left with call bell within reach, all needs met, SCDs off as found, fall mat in place, bed alarm on, and all questions answered. RN notified of session outcome and patient response.     Goals:  Goals  Goal Formulation: With patient  Time for Goal Acheivement: 5 visits  Goals: Select goal  Pt Will Go Supine To Sit: with supervision, to maximize functional mobility and independence  Pt Will Perform Sit To Supine: with supervision, to maximize functional mobility and independence  Pt Will Perform Sit to Stand: with supervision, to maximize functional mobility and independence  Pt Will Ambulate: > 200 feet, with rolling walker, with supervision, to maximize functional mobility and independence (w/ LRAD)  Pt Will Go Up / Down Stairs: 1-2 stairs, with supervision, With rail, to maximize functional mobility and independence  PPE worn during session: Mask,gloves    Tech present: No    Gasper Sells  PT  Pager [place]     Time of Treatment:  PT Received On: 01/04/23  Start Time: [id number]  Stop Time: 1310  Time Calculation (min): 40 min  Treatment # 4 out of 5 visits

## 2023-01-04 NOTE — Discharge Instr - AVS First Page (Addendum)
Reason for your Hospital Admission:  Cardiogenic shock with decompensated congestive heart failure  Arrhythmia with unsuccessful cardioversion now medically managed with amiodarone  Acute kidney injury on chronic kidney disease   Stage IV CLL, Calquence discontinued during this admission    Instructions for after your discharge:  Continue torsemide 80mg  by mouth twice a day  Check your standing weight daily  If your weight goes over 188lbs call your cardiologist and nephrologist  Get repeat labs (renal panel) at the end of this week with your PCP  Follow a low salt diet (2gm per day) and fluid restriction of 1593ml per 24 hours  Continue aspirin, lipitor, hydralazine and isordil as prescribed  Stop taking metoprolol and jardiance  Stop taking Calquence until you follow up with Dr. Rolanda Lundborg  Continue Eliquis for your right lower extremity DVT  Continue to follow up with palliative care outpatient           Home Health Discharge Information     Your doctor has ordered Skilled Nursing and Physical Therapy in-home service(s) for you while you recuperate at home, to assist you in the transition from hospital to home.     The agency that you or your representative chose to provide the service:  Name of [place] Placement: Other (comment box) ([facility])]  Phone: [phone]         The above services were set up by:  Elijah Birk, RN (Home Health Liaison)   Phone: [phone]     IF YOU HAVE NOT HEARD [place] 24-48 HOURS AFTER DISCHARGE PLEASE CALL YOUR AGENCY TO ARRANGE A TIME FOR YOUR FIRST VISIT. FOR ANY SCHEDULING CONCERNS OR QUESTIONS RELATED TO HOME HEALTH, SUCH AS TIME OR DATE PLEASE CONTACT YOUR HOME HEALTH AGENCY AT THE NUMBER LISTED ABOVE.

## 2023-01-04 NOTE — Progress Notes (Signed)
Pt has discharge order. CM spoke with pt at pt's bedside. Pt declines FWW. He is agreeable to Home Health SN and PT at discharge. Referral placed. Awaiting results of referral.      CM will continue to monitor pt's discharge needs and change plans if needed.     Nicanor Alcon, RN, BSN  Case Manager

## 2023-01-04 NOTE — Progress Notes (Signed)
01/04/23 1037   Discharge Disposition   Patient preference/choice provided? Yes   Physical Discharge Disposition Home, Home Health   Name of [place]   Patient/Family/POA notified of transfer plan Yes   Patient agreeable to discharge plan/expected d/c date? Yes   Family/POA agreeable to discharge plan/expected d/c date? Yes   Bedside nurse notified of transport plan? Yes   [facility];Home PT/OT/ST   CM Interventions   Follow up appointment scheduled? No   Reason no follow up scheduled? Family to schedule   Referral made for home health RN visit? Yes   Multidisciplinary rounds/family meeting before d/c? Yes   Medicare Checklist   Is this a Medicare patient? Yes   3 midnight inpatient qualifying stay (SNF only) Yes   If LOS 3 days or greater, did patient received 2nd IMM Letter? Yes   Date of 2nd IMM Letter 01/04/23   Time of 2nd IMM letter 1035     CM spoke with pt at pt's bedside. Pt is agreeable to his discharge home. Pt declined FWW. Pt is agreeable to home with home health SN and PT. Home health SN and PT arranged with [facility] [phone]. Wife will transport pt home at discharge.    CM will continue to follow for discharge needs and will change plans if needed.    Nicanor Alcon, RN, BSN  Case Manager

## 2023-01-04 NOTE — Brief Op Note (Signed)
INTERVENTIONAL RADIOLOGY BRIEF OP NOTE    NAME: [name] [name]   MRN: [id number]    Date/Time: 01/04/23 10:18 AM    Date of Operation:   12/25/2022    Providers Performing:   Elisabeth Cara PA-C    Operative Procedure:   Procedure(s):  SL [facility]    Who Consented:   patient    What type of catheter:   SL hohn     Reason for removal:   No longer needed    Preoperative Diagnosis:   Pre-Op Diagnosis Codes:     * Heart failure, unspecified HF chronicity, unspecified heart failure type [I50.9]    Postoperative Diagnosis:   need for home dobutamine, nephro won't clear for picc/mid    Anesthesia:   1% Lidocaine without Epi    Estimated Blood Loss:   None     Findings:   Successful removal of SL hohn from right chest with traction only, no immediate complications. Hemostasis achieved with pressure. Patient in trendelenburg for procedure. RN present at bedside for removal. Patient to be monitored for 30 minutes post removal. Patient tolerated well.       Complications:   None immediately post procedure       Signed by: Elisabeth Cara PA-C  [facility]  PA/NP [place]: [phone]  After hours [phone]  Fax [phone]    '

## 2023-01-04 NOTE — Discharge Summary (Signed)
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    Discharge Dx:   Acute decompensated systolic CHF with Cardiogenic shock /Ischemic cardiomyopathy  EF 11%, NYHA Class 4, S/p Medtronic BiV ICD, h/o MR, TR   Narrow complex supraventricular tachycardia, s/p unsuccessful cardioversion 12/21/22   Hyperlipidemia  Hypertension  AKI on CKD stage IV  Stage IV CLL, diagnosed 06/2020  Right lower extremity DVT (02/2022)  Type 2 diabetes HbA1c 5.9%  COPD  Iron deficiency anemia  Thrombocytopenia  Depression  Treatment Team:   Treatment Team:   Attending Provider: Lin Landsman, MD  Consulting Physician: Precious Reel, MD  Consulting Physician: Elizbeth Squires, MD     Procedures performed:   Radiology: all results from this admission  Tunneled Cath Removal (Permcath)    Result Date: 01/04/2023   Successful removal of  right sided hohn catheter without immediate complications. Kathaleen Grinder, MD 01/04/2023 2:00 PM    XR Chest AP Portable    Result Date: 12/31/2022  Cardiomegaly. No pulmonary arterial catheter identified. Montine Circle, MD 12/31/2022 9:47 AM    XR Chest AP Portable    Result Date: 12/30/2022  Pulmonary arterial catheter with tip projecting over the right hilum. Montine Circle, MD 12/30/2022 6:38 PM    XR Chest AP Portable    Result Date: 12/28/2022  Support hardware as above. No new acute findings. Montine Circle, MD 12/28/2022 7:36 AM    XR Chest AP Portable    Result Date: 12/27/2022   Stable positioning of right IJ PA catheter projecting over the right lower lobar pulmonary artery. Blanchard Mane 12/27/2022 11:45 PM    Tunneled Cath Placement (Permcath)    Result Date: 12/25/2022  Right internal jugular vein approach 6 French single lumen Hohn catheter tip in the superior cavoatrial junction. PLAN: Catheter  is ready for use. Erik Obey, MD 12/25/2022 3:22 PM    X-ray chest AP portable    Result Date: 12/25/2022  No change mild edema [name], MD 12/25/2022 8:36 AM    X-ray chest AP portable    Result Date: 12/24/2022  Stable exam. No new acute intrathoracic process. Montine Circle, MD 12/24/2022 11:44 AM    X-ray chest AP portable    Result Date: 12/23/2022  Improved inspiratory effort with mild dependent atelectasis [name], MD 12/23/2022 8:16 AM    XR Chest AP Portable    Result Date: 12/23/2022   Airspace disease in the medial right lower lobe. Blanchard Mane 12/23/2022 12:46 AM    XR Chest AP Portable    Result Date: 12/22/2022  1. Right Swan-Ganz catheter tip in the proximal right lower pulmonary artery. No pneumothorax. 2. Mild vascular congestion, small right pleural effusion and right basilar atelectasis. Ardelia Mems, MD 12/22/2022 4:59 PM    Chest AP Portable    Result Date: 12/20/2022  No acute intrathoracic process. Montine Circle, MD 12/20/2022 9:12 PM     Reason for Admission:   Acute decompensated systolic heart failure exacerbation with cardiogenic shock  Hospital Course:   77 y.o. 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 Subsequent readmission to CICU on 3/10 for worsening renal function and volume overload. Now off dobutamine as of 3/13 with swan removed. Status post bumex gtt transitioned to torsemide 80mg  po bid on 3/15, and IV diuril d/c'd 3/16. Evaluated for LVAD and pt not a candidate given poor renal function.     #Acute decompensated systolic CHF with Cardiogenic shock /Ischemic cardiomyopathy  EF 11%, NYHA Class 4, S/p Medtronic BiV ICD, h/o MR,  TR  - s/p dobutamine gtt (d/c'd 3/13)  - s/p bumex gtt and IV diuril  - Cr 4.1 on discharge, no acute need for HD/RRT, okay for d/c home on torsemide 80mg  po bid and repeat BMP later this week as per nephrology; needs close nephrology and cardiology follow up  -fluid restriction, low salt diet  -GDMT limited by renal dysfunction (no ACEI/ARB/MRA/SGLT2i), no beta blocker 2/2 cardiogenic shock; continue hydralazine and isordil  -not on oxygen supplementation  -hemodynamically stable, SBP 90s-100s  -cardiology on board, recommend outpatient follow up with Dr. Judeth Horn outpatient  -Hohn catheter no longer indicated, removed by IR on date of discharge with no complications     #Narrow complex supraventricular tachycardia, s/p unsuccessful cardioversion 12/21/22  - continue amiodarone 200mg  po daily     # Hyperlipidemia  -continue lipitor 40mg  po qhs  -continue niacin 500mg  po bid     #Hypertension  -continue hydralazine 100mg  po q8h  -continue isordil 40mg  po tid     #AKI on CKD stage IV  - Cr 4.1 on discharge, nephrology recommends continuing torsemide 80mg  bid, okay for d/c with BMP later this week and close cardiology f/u on d/c  -outpatient follow up with Dr. Kalman Shan  -patient needs to call cardiology and renal if weight goes over 188lbs     #Stage IV CLL, diagnosed in September 2021  -started acalabrutinib (Calquence) in July 2023  -Seen by oncology ([facility]). Hold the Calquence as it does have an anticoagulant effect and may contribute to arrhythmia.   -Continue to hold upon discharge until follows up with Dr. Rolanda Lundborg     #Right lower extremity DVT (02/2022)   -resumed home Eliquis upon discharge     #Type 2 diabetes with an A1c 5.9%  - continue lantus 33 units q12h  - continue lispro 10 units tidac     #COPD, no acute exacerbation     #Thrombocytopenia, resolved     #Iron deficit anemia   - Iron studies (3/6): Iron sat 10, Ferritin 57  - S/p Iron repletion (iron sucrose 200 mg x5 from 3/6 to 3/10)     # Depression    -outpatient follow up     #GOC  -Multiple hospital admissions for acute decompensated heart failure in 2023  - palliative care consulted as pt seems to be hospice appropriate, but pt is declining hospice, outpatient follow up with hospice    The patient is stable for discharge home. The plan was explained to the patient, he expressed understanding and was in agreement.     Condition at Discharge:   Stable  Today:     BP 117/68   Pulse 89   Temp 97.5 F (36.4 C) (Oral)   Resp 18   Ht 1.829 m (6' 0.01")   Wt 84.7 kg (186  lb 12.8 oz)   SpO2 98%   BMI 25.33 kg/m   Ranges for the last 24 hours:  Temp:  [97.3 F (36.3 C)-97.5 F (36.4 C)] 97.5 F (36.4 C)  Heart Rate:  [82-89] 89  Resp Rate:  [18-19] 18  BP: (104-123)/(62-78) 117/68    Last set of labs   Recent Labs   Lab 01/04/23  0350   WBC 10.95*   Hgb 10.5*   Hematocrit 31.9*   Platelets 182     Recent Labs   Lab 01/04/23  0350   Sodium 132*   Potassium 4.5   Chloride 94*   CO2 24   BUN 102.0*   Creatinine 4.1*   eGFR 14.3*   Glucose 198*   Calcium 9.6     Recent Labs   Lab 01/03/23  0408   Bilirubin, Total 1.5*   Bilirubin Direct 0.7*   Protein, Total 7.2   Albumin 3.7   ALT 31   AST (SGOT) 23               Invalid input(s): "FREET4"          Microbiology Results (last 15 days)       ** No results found for the last 360 hours. **            Micro / Labs / Path pending:     Unresulted Labs       Procedure . . . Date/Time    Basic Metabolic Panel 0000000 Collected: 12/22/22 1504    Specimen: Blood Updated: 12/22/22 1505    Magnesium [id number] Collected: 12/22/22 1504    Specimen: Blood Updated: 12/22/22 1505          Heart Failure:  Reason(s) for not prescribing an angiotension converting enzyme inhibitor (ACEI) at discharge: Worsening Renal Function       Discharge Instructions:      Follow-up Information       Margaretmary Lombard, MD Follow up.    Specialties: Advanced Heart Failure and Transplant Cardiology, Cardiology  Contact information:  [address]  [place] [address]  [phone]               Clearance Coots, MD. Schedule an appointment as soon as possible for a visit.    Specialty: Family Medicine  Why: As soon as possible  Contact information:  [address]  [place] [address]  [phone]               [facility]. Schedule an appointment as soon as possible for a visit in 1 week(s).    Contact information:  [address]  [place]             Gwenyth Ober, MD. Schedule an appointment as soon as possible for a visit in 1 week(s).    Specialties: Medical Oncology, Hematology, Internal Medicine  Contact information:  [address]  [place] [id number]  [facility] Follow up.    Contact information:  Phone: [phone]                           Discharge Diet: Cardiac Consistent Carbohydrates Renal      External Urinary  Catheter (Active)   Collection Container Suction canister (external cath only) 01/04/23 0200   Securement Method Securement device 01/04/23 0200   Site Assessment WDL 01/04/23 0200   Device Assessment Clean;Intact;Connected to Suction;No Air Leak 01/04/23 0200   Interventions Changed 01/02/23 2200   Urine Output (mL) 750 mL 01/04/23 0549   Number of days: 14     Immunization History   Administered Date(s) Administered    COVID-19 Ad26 vaccine 18 years and above ([employer]) 0.5 mL 12/30/2019    COVID-19 mRNA MONOVALENT vaccine PRIMARY SERIES 12 years and above (Moderna) 100 mcg/0.5 mL 10/09/2020    INFLUENZA HIGH DOSE 65 YRS+ 08/07/2015, 11/30/2016, 07/28/2017, 08/18/2018, 06/28/2020, 12/15/2021    Influenza vaccine quadrivalent adujvanted 65 years and older (FLUAD) single dose 0.5 mL (PF) 07/08/2019    Influenza vaccine quadrivalent high-dose 65 years and older ([place]) single dose 0.7 mL (PF) 08/04/2022    Pneumococcal  Conjugate 13-Valent 01/14/2015    Pneumococcal polysaccharide vaccine, 23 valent 12/29/2013    Zoster ([place]) vaccine, recombinant 08/04/2022    Zoster (ZOSTAVAX) vaccine, live 01/14/2015, 08/07/2015     Extended Emergency Contact Information  Primary Emergency Contact: Loralyn Freshwater  Address: [place], [place] [address] [place]  Mobile Phone: [phone]  Relation: Spouse  Prior  Discharge References/Attachments    None       Disposition:  [facility]     Discharge Medication List        Taking      amiodarone 200 MG tablet  Dose: 200 mg  Commonly known as: PACERONE  For: MAINTENANCE DOSING  Take 1 tablet (200 mg) by mouth daily This is a maintenance dose.     apixaban 5 MG  Dose: 5 mg  Commonly known as: ELIQUIS  Take 1 tablet (5 mg) by mouth every 12 (twelve) hours For 1 week followed by 1 tablet twice daily     aspirin EC 81 MG EC tablet  Dose: 81 mg  Take 1 tablet (81 mg) by mouth daily     atorvastatin 40 MG tablet  Dose: 40 mg  Commonly known as: LIPITOR  Take 1 tablet (40 mg total) by mouth nightly     hydrALAZINE 100 MG tablet  Dose: 100 mg  What changed:   medication strength  how much to take  Commonly known as: APRESOLINE  Take 1 tablet (100 mg) by mouth every 8 (eight) hours     Insulin Glargine (1 Unit Dial) 300 UNIT/ML injection pen  Dose: 33 Units  Commonly known as: TOUJEO  Inject 33 Units into the skin every 12 (twelve) hours     insulin lispro (1 Unit Dial) 100 UNIT/ML pen-injector  Dose: 10 Units  Commonly known as: HumaLOG KwikPen  Inject 10 Units into the skin 3 (three) times daily before meals     isosorbide dinitrate 40 MG tablet  Dose: 40 mg  What changed:   medication strength  how much to take  Commonly known as: ISORDIL  Take 1 tablet (40 mg) by mouth 3 (three) times daily     loratadine 10 MG tablet  Dose: 10 mg  Commonly known as: CLARITIN  Take 1 tablet (10 mg) by mouth daily     niacin 500 MG CR tablet  What changed:   how  much to take  how to take this  when to take this  additional instructions  Commonly  known as: NIASPAN  TAKE 1 TABLET BY MOUTH TWICE DAILY     Torsemide 40 MG Tabs  Dose: 80 mg  What changed:   medication strength  how much to take  Take 2 tablets (80 mg) by mouth 2 (two) times daily            STOP taking these medications      Insulin Glargine-yfgn 100 UNIT/ML Sopn  Commonly known as: Semglee (yfgn)     Jardiance 10 MG tablet  Generic drug: empagliflozin     metoprolol succinate XL 25 MG 24 hr tablet  Commonly known as: TOPROL-XL     tablet Calquence 100 MG Tabs  Generic drug: acalabrutinib Maleate            Minutes spent coordinating discharge and reviewing discharge plan: 43 minutes      Signed by: Lin Landsman, MD

## 2023-01-04 NOTE — Progress Notes (Signed)
[facility] Note  Home Health Referral    Referral from [place]. (Case Manager) for home health care upon discharge.    By [employer], the patient has the right to freely choose a home care provider.    A company of the patients choosing. We have supplied the patient with a listing of providers in your area who asked to be included and participate in Medicare.   [facility], formerly [facility], a home care agency that provides adult home care services and participates in Medicare   The preferred provider of your insurance company. Choosing a home care provider other than your insurance company's preferred provider may affect your insurance coverage.      Home Health Discharge Information    Your doctor has ordered Skilled Nursing and Physical Therapy in-home service(s) for you while you recuperate at home, to assist you in the transition from hospital to home.    The agency that you or your representative chose to provide the service:  Name of [place] Placement: Other (comment box) ([facility])]  Phone: [phone]       The above services were set up by:  Elijah Birk, RN (Home Health Liaison)   Phone: [phone]    IF YOU HAVE NOT HEARD [place] 24-48 HOURS AFTER DISCHARGE PLEASE CALL YOUR AGENCY TO ARRANGE A TIME FOR YOUR FIRST VISIT. FOR ANY SCHEDULING CONCERNS OR QUESTIONS RELATED TO HOME HEALTH, SUCH AS TIME OR DATE PLEASE CONTACT YOUR HOME HEALTH AGENCY AT THE NUMBER LISTED ABOVE.    Additional comments:        START PATIENT REGISTRATION INFORMATION     Order Information  Order Signing Physician: Lin Landsman, MD    Service Ordered RN ?: Yes  Service Ordered PT ?: Yes  Service Ordered OT ?: No    Service Ordered ST ?: No    Service Ordered MSW?: No    Service Ordered HHA?: No    Following Physician: Clearance Coots, MD  Following Physician Phone: [phone]  Overseeing Physician:  N/A  (Required for Residents only)   Agreeable to Follow?: Yes  Spoke with: N/A  Date/Time of Call: 01/04/23 10:26 AM      Care Coordination   Same Day [facility]?: no  Primary Care Physician:Holger Barth Kirks, MD  Primary Care Physician [phone]  Primary Care Physician Address: [phone] / [place] [address]  PCP NPI: [id number]  Visit Instructions: N/A  Service Discharge Location Type: [place] Name: N/A  Service Floor Facility: N/A  Service Room No: N/A    Demographics  Patient Last Name: [name]   Patient First Name: [name] [name]  Language/Communication Barrier: No  Service Address: [address]  [place] [address]   Service Home Phone: [phone] (home)   Other phone numbers:    Telephone Information:   Mobile [phone]     Emergency Contact: Extended Emergency Contact Information  Primary Emergency Contact: [facility]  Address: [place], [place] [address] Johnnette Litter of [place] Phone: [phone]  Relation: Spouse    Admission Information  Admit Date: 12/21/2022  Patient Status at discharge: Inpatient  Admitting Diagnosis: Cardiogenic shock [R57.0]  Heart failure [I50.9]     Caregiver Information  Caregiver First Name: N/A  Caregiver Last Name: N/A  Caregiver Relationship to Patient:  N/A  Caregiver Phone Number: N/A  Caregiver Notes: N/A            Facilities manager Information  Primary Subscriber:   Primary Subscriber Relation To Guarantor:   Primary Payor:   Primary Plan:   Primary Group #:    Primary Subscriber ID:    Primary Subscriber DOB:   Secondary Insurance Information  Secondary Subscriber:   Secondary Subscriber Relation To Guarantor:   Secondary Payor:   Secondary Plan:   Secondary Group #:   Secondary Subscriber ID:   Secondary Subscriber DOB:   HITECH  NO    END PATIENT REGISTRATION INFORMATION       Diagnosis:  Cardiogenic shock [R57.0]  Heart failure [I50.9]    [facility] [place]        Patient Name: [name] [name], [name] [name]     MRN: [id number]      CSN: [id number]         [place] #   [id number] Patient Class   Inpatient Service  Cardiac Cath Accommodation Code  Telemetry      Admission Information     Admitting Physician:  Attending Physician: Lucrezia Europe, MD  Lin Landsman, MD Unit  [facility] CTUS CARDIAC* L&D Status      Admitting Diagnosis: Cardiogenic shock; Heart failure Room / Bed  FI370/FI370-01 L&D - Last Menstrual Cycle      Chief Complaint:       Admit Type:  Admit Date/Time:  Discharge Date/Time: Elective  12/21/2022 / 2155   /  Length of Stay: 14 Days    L&D EDD   Estimated Date of Delivery: None noted.      Patient Information              Home Address: [address]  [place] [address] Employer:  Employer Address:       ,     Main Phone: [phone] Employer Phone:     SSN: [id number]       DOB: [birth year] (45 yrs)       Sex: Male Primary Care Physician: Clearance Coots, MD   Marital Status: Married Referring Physician:       Linus Salmons, MD   Race: Black or African American       Ethnicity: Not of Hispanic/Latino/S*       Emergency Contacts  Name Home Phone Work Phone Mobile Phone Relationship [name] [name], [name] [name]     [phone] Spouse           Guarantor Information     Guarantor Name: [name] [name], [name] [name] Guarantor ID: [id number]   Guarantor Relationship to Pt: Self Guarantor Type: Personal/Family   Guarantor DOB:    1946/03/23 Billing Indicator:        Guarantor Address: [address] [place] [address]          Guarantor Home Phone: [phone] Guarantor Employer:        Guarantor Work Phone:   Special educational needs teacher Emp Phone:                     Lexicographer Name: [place] Name: [employer] Address:    [address]  [place], [place] Subscriber DOB: 08/04/1954      Subscriber ID:  [id number]   Insurance Phone:   Pt Relationship to Sub:    Spouse   Insurance ID:         Group Name:   Preauthorization #: [id number]   Group #: [id number] Preauthorization Days:        Secondary Insurance     Insurance Name: - Subscriber Name:     Insurance Address:       ,   Veterinary surgeon DOB:        Subscriber ID:     Veterinary surgeon:   Pt Relationship to Sub:       Insurance ID:         Group Name:   Preauthorization #:     Group #:   Preauthorization Days:        [employer] Name: - Veterinary surgeon Name:     Insurance underwriter Address:       ,   Veterinary surgeon DOB:        Subscriber ID:     Veterinary surgeon:   Pt Relationship to Sub:       Insurance ID:         Group Name:   Preauthorization #:     Group #:   Preauthorization Days:           01/04/2023 10:30 AM              Home Health face-to-face (FTF) Encounter (Order [id number])  Consult  Date: 01/04/2023 [facility] Ordering/Authorizing: Lin Landsman, MD     Order Information    Order Date/Time Release Date/Time Start Date/Time End Date/Time   01/04/23 10:08 AM None 01/04/23 10:07 AM 01/04/23 10:07 AM     Order Details    Frequency Duration Priority Order Class   Once 1  occurrence Routine Hospital Performed     Standing Order Information    Remaining Occurrences Interval Last Released     0/1 Once 01/04/2023              Provider Information    Ordering User Ordering Provider Authorizing Provider   Elijah Birk, RN Lin Landsman, MD Lin Landsman, MD   Attending Provider(s) Admitting Provider PCP   Lanette Hampshire, MD; Lucrezia Europe, MD; Andria Meuse, MD; Dois Davenport, DO; Kathrin Ruddy, MD; Sullivan Lone, MD; Dewain Penning, MD; Loma Messing, MD; Jackquline Berlin, MD; Gillis Santa, MD; Lin Landsman, MD Lucrezia Europe, MD Clearance Coots, MD     Verbal Order Info    Action Created on Order Mode Entered by Responsible Provider Signed by Signed on   Ordering 01/04/23 1008 Telephone with Kennon Rounds, RN Lin Landsman, MD              Comments    Provider to follow: Clearance Coots, MD -- [phone]    Home nursing required for skilled assessment including cardiopulmonary assessment and dietary education for disease management, and medication instruction. Home PT required for gait and balance training, strengthening, mobility, fall prevention, and ADL training.    Cardiogenic shock [R57.0]  Heart failure [I50.9]                Home Health face-to-face (FTF) Encounter: Patient Communication     Not Released  Not seen         Order Questions    Question Answer   Date I saw the patient face-to-face: 01/04/2023   Evidence this patient is homebound because: B.  Profound weakness, poor balance/unsteady gait d/t illness/treatment/procedure    C.  Decreased endurance, strength, ROM, cadence, safety/judgment during mobility    I.  Restricted to home to decrease risk of infection   Medical conditions that necessitate Home Health care: B.  Functional impairment due to recent hospitalization/procedure/treatment    C.  Risk for complication/infection/pain requiring follow up and monitoring    D.  Chronic illness & risk for re-hospitalization due to unstable disease status    E.  Exacerbation of disease requiring follow up monitoring    G.  High risk/complex medications requiring instruction and management   Per clinical findings, following services are medically necessary: PT    Skilled Nursing   Clinical findings that support the need for Skilled Nursing. SN will: C. Monitor for signs and symptoms of exacerbation of disease and management    D. Review medication reconciliation, manage and educate on use and side effects    G. Educate on new diagnosis, treatment & management to prevent re-hospitalization    H. Assess cardiopulmonary status and monitor for signs &symptoms of exacerbation    I.  Educate dietary and or fluid restrictions and weight management   Clinical findings that support the need for Physical Therapy. PT will A.  Evaluate and  treat functional impairment and improve mobility    C.  Educate on weight bearing status, stair/gait training, balance & coordination    D.  Provide services to help restore function, mobility, and releive pain    E.  Educate on functional mobility; bed, chair, sit, stand and transfer activities    F.  Perform home safety assessment & develop safe in home exercise program    G.  Implement activities to improve stance time, cadence & step length    I.  Instruct on restorative activities to restore ability to perform ADL    H.  Educate on the safe use of assistive device/ durable medical equipment   Other (please specify) see comments for orders SN/PT                    Process Instructions    Please select Home Care Services medically necessary.    Based on the above findings, I certify that this patient is confined to the home and needs intermittent skilled nursing care, physical therapry and / or speech therapy or continues to need occupational therapy. The patient is under my care, and I have initiated the establishment of the plan of care. This patient will be followed by a physician who will periodically review the plan of care.     Collection Information            Consult Order Info    ID Description Priority Start Date Start Time   [id number] [place] face-to-face (FTF) Encounter Routine 01/04/2023 10:07 AM   Provider Specialty Referred to   ______________________________________ _____________________________________         Acknowledgement Info    For At Acknowledged By Acknowledged On   Placing Order 01/04/23 1008 Samantha Crimes, RN 01/04/23 1014                     Verbal Order Info    Action Created on Order Mode Entered by Responsible Provider Signed by Signed on   Ordering 01/04/23 1008 Telephone with Kennon Rounds, RN Lin Landsman, MD             Patient Information    Patient Name  [name],  [name] Market Legal Sex  Male DOB  10/20/1945       Reprint Order Requisition    Home Health  face-to-face (FTF) Encounter (Order [id number]) on 01/04/23       Additional Information    Associated Reports External References   Priority and Order Details [facility]                 [facility] Summary     Discharge Date:  01/04/23    Expected Start of Care: 01/06/23     Referral Source  Signed by: Elijah Birk, RN  Date Time: 01/04/23 10:26 AM      End [facility] Note

## 2023-01-04 NOTE — Progress Notes (Signed)
HEMATOLOGY ONCOLOGY PROGRESS NOTE  [facility] [place]   [phone]    Date: 01/04/23    Assessment:   #CLL. Okay to hold acalabrutinib for now until discharge. Disease had been controlled on treatment. Peak ALC before starting treatment was 40. Labs are adequate.  Hgb is stable and platelets as well.  Can hold calquence till he sees Dr. Rolanda Lundborg        #Thrombocytopenia. Chronic; baseline in the low 100s; currently 85k. Okay to monitor.     #heart failure; cardiogenic shock. As per primary team and cardiology.    Dolores Lory, MD, MD  [facility]  [phone]    Subjective:   The patient is a 77 yo male who follows with Dr. Mervin Kung of [place] for CLL who was started on acalabrutinib in July 2023 given worsening adenopathy and WBC.    Admitted now due to heart failure.    Physical Exam     Vitals:    01/04/23 0711   BP: 123/77   Pulse:    Resp:    Temp:    SpO2:        General: Alert and oriented, no acute distress  Head and neck: Oropharynx clear, eyes normal, no sinus congestion  Respiratory: Lungs clear to auscultation, normal work of breathing, no coughs  Cardiovascular: Regular rate and rhythm, no murmurs or gallops  Abdomen: Soft, nontender, nondistended, normal bowel sounds  Extremities: Well perfused, no edema  Neuro: No focal deficits, strength equal bilaterally, no confusion  Psych: Appropriate mood and affect    Medications     Current Facility-Administered Medications   Medication Dose Route Frequency    amiodarone  200 mg Oral Daily    aspirin EC  81 mg Oral Daily    atorvastatin  40 mg Oral QHS    heparin (porcine)  5,000 Units Subcutaneous Q8H [place]    hydrALAZINE  100 mg Oral Q8H [place]    insulin lispro  1-6 Units Subcutaneous QHS    insulin lispro  10 Units Subcutaneous TID AC    insulin lispro  2-10 Units Subcutaneous TID AC    isosorbide dinitrate  40 mg Oral TID - Nitrate Free Interval    niacin  500 mg Oral BID    polyethylene glycol  17 g Oral Daily     senna-docusate  1 tablet Oral Q12H [place]    torsemide  80 mg Oral BID         Labs:     Results       Procedure Component Value Units Date/Time    CBC and differential [id number]  (Abnormal) Collected: 01/04/23 0350    Specimen: Blood Updated: 01/04/23 0525     WBC 10.95 x10 3/uL      Hgb 10.5 g/dL      Hematocrit 31.9 %      Platelets 182 x10 3/uL      RBC 3.11 x10 6/uL      MCV 102.6 fL      MCH 33.8 pg      MCHC 32.9 g/dL      RDW 18 %      MPV 11.0 fL      Instrument Absolute Neutrophil Count 4.68 x10 3/uL      Neutrophils 42.7 %      Lymphocytes Automated 42.3 %      Monocytes 13.2 %      Eosinophils Automated 1.2 %      Basophils  Automated 0.2 %      Immature Granulocytes 0.4 %      Nucleated RBC 0.0 /100 WBC      Neutrophils Absolute 4.68 x10 3/uL      Lymphocytes Absolute Automated 4.63 x10 3/uL      Monocytes Absolute Automated 1.45 x10 3/uL      Eosinophils Absolute Automated 0.13 x10 3/uL      Basophils Absolute Automated 0.02 x10 3/uL      Immature Granulocytes Absolute 0.04 x10 3/uL      Absolute NRBC 0.00 x10 3/uL     Basic Metabolic Panel [id number]  (Abnormal) Collected: 01/04/23 0350    Specimen: Blood Updated: 01/04/23 0444     Glucose 198 mg/dL      BUN 102.0 mg/dL      Creatinine 4.1 mg/dL      Calcium 9.6 mg/dL      Sodium 132 mEq/L      Potassium 4.5 mEq/L      Chloride 94 mEq/L      CO2 24 mEq/L      Anion Gap 14.0     eGFR 14.3 mL/min/1.73 m2     Magnesium [id number] Collected: 01/04/23 0350    Specimen: Blood Updated: 01/04/23 0444     Magnesium 2.0 mg/dL     Type and Screen [id number] Collected: 01/04/23 0349    Specimen: Blood Updated: 01/04/23 0440     ABO Rh O POS     AB Screen Gel NEG    Narrative:      Pre-Surgical/Pre-Procedure->No  For transfusion?->Yes    Glucose Whole Blood - POCT [id number]  (Abnormal) Collected: 01/03/23 2107     Updated: 01/03/23 2118     Whole Blood Glucose POCT 213 mg/dL     Glucose Whole Blood - POCT [id number]  (Abnormal) Collected: 01/03/23 1620      Updated: 01/03/23 1629     Whole Blood Glucose POCT 177 mg/dL     Glucose Whole Blood - POCT [id number]  (Abnormal) Collected: 01/03/23 1121     Updated: 01/03/23 1147     Whole Blood Glucose POCT 198 mg/dL               Imaging   XR Chest AP Portable    Result Date: 12/31/2022  Cardiomegaly. No pulmonary arterial catheter identified. Montine Circle, MD 12/31/2022 9:47 AM    XR Chest AP Portable    Result Date: 12/30/2022  Pulmonary arterial catheter with tip projecting over the right hilum. Montine Circle, MD 12/30/2022 6:38 PM    XR Chest AP Portable    Result Date: 12/28/2022  Support hardware as above. No new acute findings. Montine Circle, MD 12/28/2022 7:36 AM    XR Chest AP Portable    Result Date: 12/27/2022   Stable positioning of right IJ PA catheter projecting over the right lower lobar pulmonary artery. Blanchard Mane 12/27/2022 11:45 PM    Tunneled Cath Placement (Permcath)    Result Date: 12/25/2022  Right internal jugular vein approach 6 French single lumen Hohn catheter tip in the superior cavoatrial junction. PLAN: Catheter is ready for use. Erik Obey, MD 12/25/2022 3:22 PM    X-ray chest AP portable    Result Date: 12/25/2022  No change mild edema [name], MD 12/25/2022 8:36 AM    X-ray chest AP portable    Result Date: 12/24/2022  Stable exam. No new acute intrathoracic process. Montine Circle, MD 12/24/2022 11:44 AM    X-ray chest AP portable  Result Date: 12/23/2022  Improved inspiratory effort with mild dependent atelectasis [name], MD 12/23/2022 8:16 AM    XR Chest AP Portable    Result Date: 12/23/2022   Airspace disease in the medial right lower lobe. Blanchard Mane 12/23/2022 12:46 AM    XR Chest AP Portable    Result Date: 12/22/2022  1. Right Swan-Ganz catheter tip in the proximal right lower pulmonary artery. No pneumothorax. 2. Mild vascular congestion, small right pleural effusion and right basilar atelectasis. Ardelia Mems, MD 12/22/2022 4:59 PM    Chest AP Portable    Result Date: 12/20/2022  No acute  intrathoracic process. Montine Circle, MD 12/20/2022 9:12 PM

## 2023-01-05 ENCOUNTER — Telehealth (INDEPENDENT_AMBULATORY_CARE_PROVIDER_SITE_OTHER): Payer: Self-pay

## 2023-01-05 NOTE — Telephone Encounter (Signed)
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 CO and Sats     Follow Up Appt with PCP/Specialist: Adv HF clinic fuv 3/22, PCP Post Hosp FUV 3/22, and 3/29- physical. Also for Neph and Oncology fuv, and Palliative care fuv      Patient Interview:    "I heard you went to the hospital for sob,weakness, tell me what happened? Hx- admitted with malaise, SOB worsening, heart pounding/palpitaions, feeling chest congestion- transferred to [facility] heart, in HF  What type of teaching did you receive regarding your symptoms/diagnosis? Heart failure, and medications changed  What symptoms do you have now, if any? Pt states he is feeling good at home, has not had chest pain, not sob, legs not swollen, no c/o light headedness or dizziness. Has a scale for home weights, reports weight 184 today. Needs new [employer] supplies, advised on checking BG with glucometer in interim and having glucometer for backup, pt states he will obtain one today or tomorrow for finger stick Bg checks, as insulin doses were changed.  Reports he was out on errands today, resting at home now and feeling better.  Tell me about your current medications and any changes the hospital made: [place] metoprolol, jardiance, calquence. Change insulin type and dose, increase torsemide, hydralazine, imdur doses. Resume other routine meds per AVS  If new medication  prescribed: amiodarone qd, lispro insulin with meals  Tell me about the new medication and how you are to take it? Pt was familiar with med changes made and states has all new RX, new doses, was following med list carefully and had no questions on med changes  What difficulty did you have, if any, picking up your medication(s)? None   If the new medication is an injection: change to toujeo/glargine 300 units/ml with dose 33 unbid, q 12 hr. Increase lispro to 10 units tid meals- injection pens    What appointments have you made for follow-up? PCP, Adv HF  What transportation options do you have to make it to your appointments? Car, wife drives  What assistance, if any, are you in need of while continuing your care at home? Mostly indep with mobility and self care at home, lives at home  with wife  [facility], for SN, PT      "Thank you for taking my call today! If there is anything that you need, please give your office a call at [facility], [phone]."

## 2023-01-05 NOTE — UM Notes (Signed)
Requesting final auth to be faxed to [phone] or call [phone]    Auth Number :  [id number]    Discharge Date -  01/04/2023  3:28 PM    ER ADMIT DATE AND TIME: 12/21/2022  9:55 PM  OBS ADMIT DATE: 12/21/2022  9:55 PM  IP ADMIT DATE: 12/21/2022  9:55 PM                NAME: [name]             MR#: [id number]      Patient Address:  [address]  [place] [address]    Patient phone: [phone] (home)     PATIENT NAME: [name] [name], [name] [name]  DOB: [birth year]   PMH:  has a past medical history of Acute CHF, Acute systolic (congestive) heart failure (08/2017), Anxiety, CAD (coronary artery disease), Cardiomyopathy, CHF (congestive heart failure) (08/12/2014, 2013), Chronic obstructive pulmonary disease, CLL (chronic lymphocytic leukemia) (10/26/2022), Coronary artery disease (2011), Depression, echocardiogram (02/2016, 11/2016, 09/2017, 12/20/2017), GERD (gastroesophageal reflux disease), Heart attack (2011), Hyperlipemia, Hypertension, ICD (implantable 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, Ischemic cardiomyopathy, Nonischemic cardiomyopathy, NSTEMI (non-ST elevated myocardial infarction), 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  PSH:  has a past surgical history that includes duodenal ulcer (1973); orthopedic surgery (AGE 10); EGD (N/A, 08/15/2014); Circumcision (AGE 54); Fracture surgery (AGE 17); Hernia repair (AGE 54); Colonoscopy (2009); Tonsillectomy and adenoidectomy (1954); Cardiac defibrillator placement (12/2012); Cardiac pacemaker placement (2014); EGD (1975); EGD, COLONOSCOPY (N/A, 04/04/2018); ECHOCARDIOGRAM, TRANSTHORACIC (02/2016 11/2016 09/2017 12/2017); MPI nuclear study (06/2016); Correction hammer toe; BIV (11/10/2016); ECHOCARDIOGRAM, TRANSTHORACIC (02/2016,11/2016,12/20/2017); Cardiac catheterization (03/2010); Cardiac catheterization (12/2012);  Right Heart Cath (Right, 09/09/2022); Right Heart Cath ONLY incl. CO and Sats (Right, 12/22/2022); and Tunneled Cath Placement (Permcath) (N/A, 12/25/2022).      DIAGNOSIS:     ICD-10-CM    1. Nonischemic cardiomyopathy  I42.8 Cardiac Cath Case Request     Cardiac Cath Case Request     Minna Antis     Advanced Heart Failure APP Visit ([facility])      2. Heart failure  I50.9       3. Non-ischemic cardiomyopathy  I42.8 Right Heart Cath ONLY incl. CO and Sats     Right Heart Cath ONLY incl. CO and Sats     CANCELED: Right Heart Cath     CANCELED: Right Heart Cath      4. Heart failure, unspecified HF chronicity, unspecified heart failure type  I50.9 Tunneled Cath Placement (Permcath)     Tunneled Cath Placement (Permcath)      5. Chronic systolic congestive heart failure  I50.22 Cardiac Cath Case Request     Cardiac Cath Case Request     Advanced Heart Failure APP Visit ([facility])      6. Acute on chronic right-sided heart failure  I50.813 Cardiac Cath Case Request     Cardiac Cath Case Request      7. Ischemic cardiomyopathy  I25.5 Cardiac Cath Case Request     Cardiac Cath Case Request      8. Cardiogenic shock  R57.0 Insert arterial line     Insert Arterial Line      9. Chronic systolic heart failure  [id number] Internal Referral to Cardiac Rehabilitation      10. Encounter for removal of tunneled central venous catheter (CVC) with port  Z45.2 Tunneled Cath  Removal (Permcath)     Tunneled Cath Removal (Permcath)

## 2023-01-06 ENCOUNTER — Other Ambulatory Visit: Payer: Self-pay | Admitting: Family Medicine

## 2023-01-06 NOTE — Progress Notes (Signed)
[facility] patient admitted to:      Hospital Admission/Discharge Dates:   12/21/22 to 01/04/23     Name: [name] [name]    ### Patient Details  Date of Birth: November 02, 1945  MRN: [id number]    ### Encounter Details  Arrival Date: 12/21/2022 09:55 PM EST  Discharge Date: 01/04/2023 03:28 PM EDT  Encounter ID: [id number] TCM3/18/2024   3:28:00PM    ### Related interaction  [facility]charge [facility] (Post Discharge TCM Outreach 1) ([email or web address])    ### Required Interventions and Feedback     Call Status         Call Status:     No Attempt (edited by MF on 01/06/2023 08:51 AM EDT)    Comments::     Patient received an automated post-discharge outreach call.  Person accepting call hung up  No further action needed at this time.   (edited by MF on 01/06/2023 08:51 AM EDT)    Jetty Duhamel BSN, RN, [facility]  [address] [place], [place] [address]  Ph: [phone]

## 2023-01-07 ENCOUNTER — Encounter: Payer: Self-pay | Admitting: Diagnostic Radiology

## 2023-01-08 ENCOUNTER — Ambulatory Visit (INDEPENDENT_AMBULATORY_CARE_PROVIDER_SITE_OTHER): Payer: Commercial Managed Care - POS | Admitting: Cardiovascular Disease

## 2023-01-08 ENCOUNTER — Encounter (INDEPENDENT_AMBULATORY_CARE_PROVIDER_SITE_OTHER): Payer: Self-pay | Admitting: Family Medicine

## 2023-01-08 ENCOUNTER — Encounter (INDEPENDENT_AMBULATORY_CARE_PROVIDER_SITE_OTHER): Payer: Self-pay | Admitting: Cardiovascular Disease

## 2023-01-08 ENCOUNTER — Ambulatory Visit (INDEPENDENT_AMBULATORY_CARE_PROVIDER_SITE_OTHER): Payer: Commercial Managed Care - POS | Admitting: Family Medicine

## 2023-01-08 VITALS — BP 111/71 | HR 85 | Temp 98.3°F | Resp 16 | Wt 186.0 lb

## 2023-01-08 DIAGNOSIS — E118 Type 2 diabetes mellitus with unspecified complications: Secondary | ICD-10-CM

## 2023-01-08 DIAGNOSIS — Z794 Long term (current) use of insulin: Secondary | ICD-10-CM

## 2023-01-08 DIAGNOSIS — I428 Other cardiomyopathies: Secondary | ICD-10-CM

## 2023-01-08 DIAGNOSIS — I5023 Acute on chronic systolic (congestive) heart failure: Secondary | ICD-10-CM

## 2023-01-08 DIAGNOSIS — Z09 Encounter for follow-up examination after completed treatment for conditions other than malignant neoplasm: Secondary | ICD-10-CM

## 2023-01-08 MED ORDER — METOPROLOL SUCCINATE ER 25 MG PO TB24
25.0000 mg | ORAL_TABLET | Freq: Every day | ORAL | 3 refills | Status: DC
Start: 2023-01-08 — End: 2023-02-26

## 2023-01-08 MED ORDER — BD PEN NEEDLE MINI U/F 31G X 5 MM MISC
3 refills | Status: DC
Start: 2023-01-08 — End: 2023-07-23

## 2023-01-08 MED ORDER — FREESTYLE LIBRE 3 SENSOR MISC
1.0000 | 3 refills | Status: DC
Start: 2023-01-08 — End: 2023-07-23

## 2023-01-08 MED ORDER — INSULIN GLARGINE 100 UNIT/ML SC SOLN
33.0000 [IU] | Freq: Two times a day (BID) | SUBCUTANEOUS | 3 refills | Status: DC
Start: 2023-01-08 — End: 2023-05-05

## 2023-01-08 NOTE — Progress Notes (Signed)
Have you seen any specialists since your last visit with [place]?  Yes, hospital, cardiology and oncology       The patient was informed that the following HM items are still outstanding:   colonoscopy, eye exam, and Tdap vaccine, statin use and advance directive

## 2023-01-08 NOTE — Progress Notes (Signed)
[facility] OFF[facility] PROGRESS NOTE    [facility] OFFICE -CARDIOLOGY  [place] [address]  Dept: [phone]  Dept Fax: [phone]       Patient Name: [name] [name]    Date of Visit:  January 08, 2023  Date of Birth: 1946/07/04  AGE: 77 y.o.  Medical Record #: [id number]      CHIEF COMPLAINT:    No chief complaint on file.      HISTORY OF PRESENT ILLNESS:    I had the pleasure of seeing [name] today in follow up in the [facility] at [facility]. He is a pleasant 77 y.o. male who has chronic systolic HF and nonischemic cardiomyopathy with EF 25-30% by echo in August 2022 and EF 21 % by echo in November 2023 when he was admitted for symptoms related to HF.  His most recent echocardiogram demonstrates an LVEF of 11%.  He was admitted to [facility] in February and March 2024 due to worsening heart failure.  He spent time in the ICU on inotropes and unfortunately his renal function did not improve.   He has completed LVAD evaluation and unfortunately he has not a candidate due to worsening renal function.    He has a Medtronic ICD in place.    Dr. Mervin [name] is his oncologist for management of CLL.    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 Kalman [name] as outpatient  Stage IV CLL, diagnosed in September 2021, started  acalabrutinib in July 2023  Seen by oncology (12/24/2022): Hold the Calquence as it does have an anticoagulant effect and may contribute to arrhythmia. At this moment from a CLL standpoint, he is quite stable.  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  He can follow-up with Dr. Judeth [name] as an outpatient  Appears euvolemic. Continue torsemide dosing as per nephrology  Continue rest of CV meds with aspirin, statin, hydralazine/Isordil, amiodarone 200 mg daily (failed DCCV in CICU; nsvt last few days), and torsemide  Goals of care discussion and palliative care consulted  Long discussion re: HF education with salt and fluid restriction. He would benefit from further discussion with HF RN navigator tomorrow if here.  Discharge planning for possibly today v tomorrow.  I will order close follow-up with Dr. Judeth [name] and his NP, [name] [name]  See below    PAST MEDICAL HISTORY: He has a past medical history of Acute CHF, Acute systolic (congestive) heart failure (08/2017), Anxiety, CAD (coronary artery disease), Cardiomyopathy, CHF (congestive heart failure) (08/12/2014, 2013), Chronic obstructive pulmonary disease, CLL (chronic lymphocytic leukemia) (10/26/2022), Coronary artery disease (2011), Depression, echocardiogram (02/2016, 11/2016, 09/2017, 12/20/2017), GERD (gastroesophageal reflux disease), Heart attack (2011), Hyperlipemia, Hypertension, ICD (implantable 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, Ischemic cardiomyopathy, Nonischemic cardiomyopathy, NSTEMI  (non-ST elevated myocardial infarction), 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 He has a past surgical history that includes duodenal ulcer (1973); orthopedic surgery (AGE 33); EGD (N/A, 08/15/2014); Circumcision (AGE 75); Fracture surgery (AGE 53); Hernia repair (AGE 75); Colonoscopy (2009); Tonsillectomy and adenoidectomy (1954); Cardiac defibrillator placement (12/2012); Cardiac pacemaker placement (2014); EGD (1975); EGD, COLONOSCOPY (N/A, 04/04/2018); ECHOCARDIOGRAM, TRANSTHORACIC (02/2016 11/2016 09/2017 12/2017); MPI nuclear study (06/2016); Correction hammer toe; BIV (11/10/2016); ECHOCARDIOGRAM, TRANSTHORACIC (02/2016,11/2016,12/20/2017); Cardiac catheterization (03/2010); Cardiac catheterization (12/2012); Right Heart Cath (Right, 09/09/2022); Right Heart Cath ONLY incl. CO and Sats (Right, 12/22/2022); Tunneled Cath Placement (Permcath) (N/A, 12/25/2022); and Tunneled Cath Removal (Permcath) (Right, 01/04/2023).    ALLERGIES:   Allergies   Allergen Reactions    Allopurinol        MEDICATIONS:   Current Outpatient Medications:     amiodarone (PACERONE) 200 MG tablet, Take 1 tablet (200 mg) by mouth daily This is a maintenance dose., Disp: 30 tablet, Rfl: 0    apixaban (ELIQUIS) 5 MG, Take 1 tablet (5 mg) by mouth every 12 (twelve) hours For 1 week followed by 1 tablet twice daily, Disp: 90 tablet, Rfl: 2    aspirin EC 81 MG EC tablet, Take 1 tablet (81 mg) by mouth daily, Disp: , Rfl:     atorvastatin (LIPITOR) 40 MG tablet, Take 1 tablet (40 mg total) by mouth nightly, Disp: 90 tablet, Rfl: 0    hydrALAZINE (APRESOLINE) 100 MG tablet, Take 1 tablet (100 mg) by mouth every 8 (eight) hours, Disp: 90 tablet, Rfl: 0    Insulin Glargine, 1 Unit Dial, (TOUJEO) 300 UNIT/ML injection pen, Inject 33 Units into the skin every 12 (twelve) hours, Disp: 6.6 mL, Rfl: 0    insulin  lispro, 1 Unit Dial, 100 UNIT/ML pen-injector, Inject 10 Units into the skin 3 (three) times daily before meals, Disp: 9 mL, Rfl: 0    isosorbide dinitrate (ISORDIL) 40 MG tablet, Take 1 tablet (40 mg) by mouth 3 (three) times daily, Disp: 90 tablet, Rfl: 0    loratadine (CLARITIN) 10 MG tablet, Take 1 tablet (10 mg) by mouth daily, Disp: 5 tablet, Rfl: 0    niacin (NIASPAN) 500 MG CR tablet, TAKE 1 TABLET BY MOUTH TWICE DAILY (Patient taking differently: 250 mg Taking half a tablet), Disp: 180 tablet, Rfl: 3    torsemide 40 MG Tab, Take 2 tablets (80 mg) by mouth 2 (two) times daily, Disp: 120 tablet, Rfl: 0     FAMILY HISTORY: family history includes Breast cancer in his mother; Coronary artery disease in his mother; Diabetes in his brother and mother; Heart attack in his father; Heart attack (age of onset: 5) in his mother; Hypertension in his father and mother.    SOCIAL HISTORY: He reports that he has never smoked. He has never used smokeless tobacco. He reports that he does not currently use alcohol. He reports that he does not use drugs.      PHYSICAL EXAMINATION  There were no vitals taken for this visit.        Last 3 Weights:      Row Labels 01/02/2023     8:25 AM 01/03/2023     6:20 AM 01/04/2023     3:37 AM   Weight Monitoring   Section Header. No data exists in this row.      Weight   84.369 kg 84.006 kg 84.732 kg   Weight Method   Standing Scale Standing Scale Standing Scale   BMI (calculated)   25.2 kg/m2 25.1 kg/m2 25.3 kg/m2         General Appearance:  Cooperative and in no acute distress.    Skin: Warm and dry to touch  Head: Normocephalic  Eyes: Conjunctivae and lids unremarkable.    Neck: No JVD  Chest: Clear to auscultation bilaterally with good respiratory effort and no wheezes, rales, or rhonchi   Cardiovascular: Regular rhythm, S1 normal, S2 normal, 1/6 holosystolic murmur in the mid axillary   Abdomen: Soft, nontender with normoactive bowel sounds.   Extremities: Warm/dry without edema, +2  b/l radial pulses  Neuro: Alert and oriented x3. No gross motor or sensory deficits noted, affect appropriate.    TTE, 09/08/2022:    * Markedly dilated left ventricle with severely reduced systolic function  (LVEDVi 109 mL/m2, ejection fraction 21%).    * Hypertrabeculated left ventricle with severe global hypokinesis.    * Mild aortic valve and moderate mitral valve regurgitation (details below).    * Wire in right-sided chambers with mild to moderate (2+) peri-device  tricuspid valve regurgitation.    * Dilated, poorly responsive IVC.    * When visually compared with the prior study dated 05/29/2021, LV function  is less vigorous (previous EF 31%, now 21%; previous LVOT VTI 17 cm, now 11  cm); valvular regurgitation is less prominent.    IMPRESSION:   [name] [name] is a 77 y.o. male with the following:    Non-ischemic Cardiomyopathy, EF 21%, NYHA Class 4 at admission, ACC/AHA Stage D, LVIDd 6.9 cm, Medtronic BiV-IC   Multiple hospital admissions in 2023 including Nov 2023 and Feb 2024  [name] [name] spent considerable time in the cardiac ICU and inpatient service at [facility].  With renal function not improving unfortunately he is not a candidate for LVAD.  He was discharged with plans for hospice.  Will continue taking care of him with his regular.  Today I am adding metoprolol due to an elevated heart rate in room with his blood pressure.  His other medications upon leaving the hospital will stay the same.  Variable diuretic rx compliance, now compliant  Chronic systolic heart failure  Moderate mitral and tricuspid regurgitation  AKI on CKD4  Stage IV CLL, dx 06/2020, started acalabrutinib 04/2022  RLE DVT (02/2022)  T2DM, A1C 5.9%  Dyslipidemia  COPD, stable      RECOMMENDATIONS:    Follow up with Dr. Mervin [name] from [facility] regarding CLL.    It is okay to resume his chemotherapy plan for treating CLL  Continue to limit sodium rich foods, adding salt or eating fast food.  Continue torsemide 40mg  twice  daily  Restart metoprolol succinate 25 mg daily  Continue to monitor your weight each morning on your home scale.  For a weight gain of 2 pound in a day or 3 pounds in 2-3 days, take an extra dose of torsemide in the morning.  Follow up with [name] Dieter, NP in one month then  our clinic with me in 2 months  We are confirming that hospice services have seen him at home                                                 No orders of the defined types were placed in this encounter.      No orders of the defined types were placed in this encounter.        SIGNED:    Margaretmary Lombard, MD          This note was generated by the Dragon speech recognition and may contain errors or omissions not intended by the user. Grammatical errors, random word insertions, deletions, pronoun errors, and incomplete sentences are occasional consequences of this technology due to software limitations. Not all errors are caught or corrected. If there are questions or concerns about the content of this note or information contained within the body of this dictation, they should be addressed directly with the author for clarification.

## 2023-01-08 NOTE — Progress Notes (Signed)
Subjective:      Date: 01/08/2023 2:55 PM   Patient ID: [name] [name] is a 77 y.o. male.    Chief Complaint:  Chief Complaint   Patient presents with    Congestive Heart Failure     Pt was admitted for heart failure. This is a post hospital follow up     Diabetes     Pt would like his FreeStyle Libre 3 refilled        HPI:  HPI    HTN: Hypertension is well controlled at this point.  Patient is checking his blood pressure regularly at least 3-4 times a week.  Blood pressure at home is always in normal range.  Patient avoids adding salt and is doing regular cardiac exercise.  Average 120/80    DM : Patient is compliant with current medication.  Tries to exercise at least 3 times a week.  Watches type of diet and amount of carbohydrate intake.  Check lab work for diabetes regularly in our clinic.  Average 120    Discussed with patient that I would keep the insulin dose at the same level he is taking currently.  But to very closely monitor for his glucose levels because the more concerned about it going too low then to be be too high.  And to call me immediately if his levels go below 100.  Recommend to follow-up in a week for a video visit so that we can discuss the glucose levels again.  Problem List:  Patient Active Problem List   Diagnosis    Type 2 diabetes mellitus with complication, with long-term current use of insulin    Heart attack    CAD (coronary artery disease)    Non-ischemic cardiomyopathy    Acute exacerbation of CHF (congestive heart failure)    PUD (peptic ulcer disease)    Acute dyspnea    CHF (congestive heart failure)    Headaches due to old head injury    Essential hypertension    Syncope and collapse    AICD (automatic cardioverter/defibrillator) present    Cardiomyopathy    SOB (shortness of breath)    GERD (gastroesophageal reflux disease)    Hypertensive heart disease without heart failure    Heart failure, unspecified    Nonischemic cardiomyopathy    Pure hypercholesterolemia     Chronic systolic congestive heart failure    Encounter for implantable defibrillator reprogramming or check    Episode of recurrent major depressive disorder, unspecified depression episode severity    Stage 3 chronic kidney disease, 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    Anxiety and depression    COPD (chronic obstructive pulmonary disease)    HLD (hyperlipidemia)    Sleep apnea, obstructive    Scrotal edema    Hydrocele, unspecified hydrocele type    Acute on chronic congestive heart failure, unspecified heart failure type    Acute on chronic right-sided heart failure    Ischemic cardiomyopathy    CLL (chronic lymphocytic leukemia)    SVT (supraventricular tachycardia)    Heart failure    Iron deficiency anemia secondary to inadequate dietary iron intake    Heart failure, unspecified HF chronicity, unspecified heart failure type    Encounter for removal of tunneled central venous catheter (CVC) with port       Current Medications:  Outpatient Medications Marked as Taking for the 01/08/23 encounter (Office Visit) with Clearance Coots, MD   Medication Sig Dispense Refill    amiodarone (PACERONE)  200 MG tablet Take 1 tablet (200 mg) by mouth daily This is a maintenance dose. 30 tablet 0    apixaban (ELIQUIS) 5 MG Take 1 tablet (5 mg) by mouth every 12 (twelve) hours For 1 week followed by 1 tablet twice daily 90 tablet 2    aspirin EC 81 MG EC tablet Take 1 tablet (81 mg) by mouth daily      atorvastatin (LIPITOR) 40 MG tablet Take 1 tablet (40 mg total) by mouth nightly 90 tablet 0    hydrALAZINE (APRESOLINE) 100 MG tablet Take 1 tablet (100 mg) by mouth every 8 (eight) hours 90 tablet 0    insulin lispro, 1 Unit Dial, 100 UNIT/ML pen-injector Inject 10 Units into the skin 3 (three) times daily before meals 9 mL 0    isosorbide dinitrate (ISORDIL) 40 MG tablet Take 1 tablet (40 mg) by mouth 3 (three) times daily 90 tablet 0    loratadine (CLARITIN) 10 MG tablet Take 1 tablet (10 mg) by mouth daily 5 tablet 0     metoprolol succinate XL (TOPROL-XL) 25 MG 24 hr tablet Take 1 tablet (25 mg) by mouth daily 90 tablet 3    niacin (NIASPAN) 500 MG CR tablet TAKE 1 TABLET BY MOUTH TWICE DAILY 180 tablet 3    torsemide 40 MG Tab Take 2 tablets (80 mg) by mouth 2 (two) times daily 120 tablet 0    [DISCONTINUED] Insulin Glargine, 1 Unit Dial, (TOUJEO) 300 UNIT/ML injection pen Inject 33 Units into the skin every 12 (twelve) hours 6.6 mL 0       Allergies:  Allergies   Allergen Reactions    Allopurinol        Past Medical History:  Past Medical History:   Diagnosis Date    Acute CHF     NOV  & DEC 2018, - DIALYSIS CATH PLACED Aug 24 2017 TO REMOVE FLUID     Acute systolic (congestive) heart failure 08/2017    Anxiety     CAD (coronary artery disease)     Cardiomyopathy     nonischemic    CHF (congestive heart failure) 08/12/2014, 2013    Chronic obstructive pulmonary disease     POSSIBLE PER PT HE USES  SYNBICORT BID ABD  BREO INHALER PRN    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    Coronary artery disease 2011    CORONARY STENT PLACEMENT FOLLOWED BY [facility]    Depression     echocardiogram 02/2016, 11/2016, 09/2017, 12/20/2017    GERD (gastroesophageal reflux disease)     Heart attack 2011    and 2014    Hyperlipemia     Hypertension     ICD (implantable 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     PLACED 2014  [facility]  LAST INTERROGATION April 01 2018 REPORT REQUESTED    Ischemic cardiomyopathy     EF 15% ON ECHO 09-20-2017 DR GARG IN EP[facility]    Nonischemic cardiomyopathy     NSTEMI (non-ST elevated myocardial infarction)     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    Pacemaker 2014    MED[employer]IC ICD/PACEMAKER COMBO LAST INTERROGATION  12-12 2018    Pneumonia 09/2016    Primary cardiomyopathy     Ischemic cardiomyopathy EF 15% ON ECHO 09-20-2017 DR GARG IN EPIC    Sepsis 08/2017    Syncope and collapse     PRIOR TO ICD/PACEMAKER PLACED 2014    Type 2 diabetes mellitus, controlled DX 1998    BS  AVG 100  A1C 7.4 Dec 27 2017    [place]     PT SEE HIS PMD April 01 2018 RE THIS-TOLD TO SEE PMD BY [facility] JUNE 12 WHEN THEY SAW HIM FOR A CL       Past Surgical History:  Past Surgical History:   Procedure Laterality Date    BIV  11/10/2016    ICD [place]  UPGRADE     CARDIAC CATHETERIZATION  03/2010    CORONARY STENT PLACEMENT X 2 PER PT; LM normal, LAD 70-80% distal lesion at apex, 20% lesion mid CFX    CARDIAC CATHETERIZATION  12/2012    CARDIAC DEFIBRILLATOR PLACEMENT  12/2012    [facility]    CARDIAC PACEMAKER PLACEMENT  2014    MEDTRONIC ICD/PACEMAKER PLACED [facility]    CIRCUMCISION  AGE 89    COLONOSCOPY  2009    CORRECTION HAMMER TOE      duodenal ulcer  1973    STRESS RELATED IN SCHOOL    ECHOCARDIOGRAM, TRANSTHORACIC  02/2016 11/2016 09/2017 12/2017    ECHOCARDIOGRAM, TRANSTHORACIC  02/2016,11/2016,12/20/2017    EGD N/A 08/15/2014    Procedure: EGD;  Surgeon: Loraine Maple, MD;  Location: [facility];  Service: Gastroenterology;  Laterality: N/A;  egd w/ bx    EGD  1975    EGD, COLONOSCOPY N/A 04/04/2018    Procedure: EGD with bxs, COLONOSCOPY with polypectomy and clipping;  Surgeon: Omer Jack, MD;  Location: [facility];  Service: Gastroenterology;  Laterality: N/A;    FRACTURE SURGERY  AGE 61     RT  ANKLE- CAST APPLIED    HERNIA REPAIR  AGE 89    LEFT INGUINAL HERNIA    MPI nuclear study  06/2016    orthopedic surgery  AGE 81    RT foot corrective - [facility]OE    RIGHT HEART CATH Right 09/09/2022    Procedure: Right Heart Cath;  Surgeon: Wandra Arthurs, MD;  Location: [facility];  Service: Cardiovascular;  Laterality: Right;  tx from [place] INCL. CO AND SATS Right 12/22/2022    Procedure: Right Heart Cath ONLY incl. CO and Sats;  Surgeon: Day, Lillette Boxer, MD;  Location: [facility];  Service: Cardiovascular;  Laterality: Right;  Right Heart Cath with leave in PA cath for milrinone initiation    [place]    TUNNELED CATH PLACEMENT ([facility]) N/A 12/25/2022    Procedure: [facility];   Surgeon: Estelle Grumbles, MD;  Location: [facility];  Service: Interventional Radiology;  Laterality: N/A;    TUNNELED CATH REMOVAL ([facility]) Right 01/04/2023    Procedure: TUNNELED CATH REMOVAL;  Surgeon: Elizbeth Squires, MD;  Location: [facility];  Service: Interventional Radiology;  Laterality: Right;       Family History:  Family History   Problem Relation Age of Onset    Breast cancer Mother     Heart attack Mother 51    Hypertension Mother     Diabetes Mother     Coronary artery disease Mother     Diabetes Brother     Heart attack Father     Hypertension Father        Social History:  Social History     Socioeconomic History    Marital status: Married     Spouse name: Harle Battiest    Number of children: 0   Occupational History  Occupation: Therapist, [employer], [place]    Tobacco Use    Smoking status: Never    Smokeless tobacco: Never   Vaping Use    Vaping status: Never Used   Substance and Sexual Activity    Alcohol use: Not Currently    Drug use: No    Sexual activity: Not Currently     Partners: Female     Social Determinants of Health     Financial Resource Strain: Low Risk  (12/22/2022)    Overall Financial Resource Strain (CARDIA)     Difficulty of Paying Living Expenses: Not hard at all   Food Insecurity: No Food Insecurity (09/12/2022)    Hunger Vital Sign     Worried About Running Out of Food in the Last Year: Never true     [place] in the Last Year: Never true   Transportation Needs: No Transportation Needs (12/22/2022)    PRAPARE - Armed forces logistics/support/administrative officer (Medical): No     Lack of Transportation (Non-Medical): No   Intimate Partner Violence: Not At Risk (09/12/2022)    Humiliation, Afraid, Rape, and Kick questionnaire     Fear of Current or Ex-Partner: No     Emotionally Abused: No     Physically Abused: No     Sexually Abused: No   Housing Stability: Low Risk  (12/22/2022)    Housing Stability Vital Sign     Unable to Pay for Housing in the Last Year: No     Number of Places  Lived in the Last Year: 1     Unstable Housing in the Last Year: No        The following sections were reviewed this encounter by the provider:   Tobacco  Allergies  Meds  Problems  Med Hx  Surg Hx  Fam Hx         Vitals:  BP 111/71 (BP Site: Left arm, Patient Position: Sitting, Cuff Size: Medium)   Pulse 85   Temp 98.3 F (36.8 C) (Temporal)   Resp 16   Wt 84.4 kg (186 lb)   SpO2 98%   BMI 25.23 kg/m     ROS:  Constitutional: Negative for fever, chills and malaise/fatigue.   Respiratory: Negative for cough, shortness of breath and wheezing.    Cardiovascular: Negative for chest pain and palpitations.   Gastrointestinal: Negative for abdominal pain, diarrhea and constipation.   Musculoskeletal: Negative for joint pain.   Skin: Negative for rash.   Neurological: Negative for dizziness and headaches.   Psychiatric/Behavioral: The patient does not have insomnia.         Objective:     Physical Exam:  Constitutional: Patient is oriented to person, place, and time. Appears well-developed and well-nourished.   HENT:   Head: Normocephalic and atraumatic.   Eyes: Conjunctivae and EOM are normal. Pupils are equal, round, and reactive to light.   Cardiovascular: Normal rate, regular rhythm and normal heart sounds.  Exam reveals no gallop and no friction rub.    No murmur heard.  Pulmonary/Chest: Effort normal and breath sounds normal. Has no wheezes.   Musculoskeletal: Normal range of motion. Exhibits no edema or tenderness.   Neurological: Patient is alert and oriented to person, place, and time.   Psychiatric: Patient has a normal mood and affect.        Assessment/Plan:       1. Type 2 diabetes mellitus with complication, with long-term current use of insulin  -  Continuous Blood Gluc Sensor (FreeStyle Libre 3 Sensor) Misc; Use 1 Device every 14 (fourteen) days  Dispense: 6 each; Refill: 3  - Insulin Pen Needle (B-D UF III MINI PEN NEEDLES) 31G X 5 MM Misc; Use as directed daily  Dispense: 200 each; Refill:  3  - insulin glargine 100 UNIT/ML injection; Inject 33 Units into the skin every 12 (twelve) hours  Dispense: 20 mL; Refill: 3      2. Hospital discharge follow-up  Diagnostic Results:   Prognosis: improving  Risks/Benefits of Treatment Options: Risks and benefits of therapy discussed with the patient.  Instructions for Management:   Compliance:   Risk Factor Reduction:   Patient/Family Education:   TOTAL TIME SPENT WITH PATIENT: 35 minutes  TIME SPENT COUNSELING/COORDINATING CARE: more than half the time was spent discussing diagnosis and treatment with the patient  >50% of total time spent with patient was regarding counseling/coordinating of care.        Clearance Coots, MD

## 2023-01-11 ENCOUNTER — Encounter (INDEPENDENT_AMBULATORY_CARE_PROVIDER_SITE_OTHER): Payer: Self-pay | Admitting: Family Medicine

## 2023-01-11 ENCOUNTER — Encounter: Payer: Self-pay | Admitting: Advanced Heart Failure and Transplant Cardiology

## 2023-01-11 DIAGNOSIS — I429 Cardiomyopathy, unspecified: Secondary | ICD-10-CM

## 2023-01-11 DIAGNOSIS — Z95810 Presence of automatic (implantable) cardiac defibrillator: Secondary | ICD-10-CM

## 2023-01-11 LAB — REMOTE CARDIAC DEVICE MONITORING
AF Burden Percentage: 0
ICD Shock Recent Count: 0
RV Pacing Percentage: 9.03

## 2023-01-12 ENCOUNTER — Encounter (INDEPENDENT_AMBULATORY_CARE_PROVIDER_SITE_OTHER): Payer: Self-pay | Admitting: Family Medicine

## 2023-01-15 ENCOUNTER — Encounter (INDEPENDENT_AMBULATORY_CARE_PROVIDER_SITE_OTHER): Payer: Commercial Managed Care - POS | Admitting: Family Medicine

## 2023-01-18 ENCOUNTER — Encounter (INDEPENDENT_AMBULATORY_CARE_PROVIDER_SITE_OTHER): Payer: Self-pay | Admitting: Family Medicine

## 2023-01-18 ENCOUNTER — Telehealth (INDEPENDENT_AMBULATORY_CARE_PROVIDER_SITE_OTHER): Payer: Self-pay | Admitting: Family Medicine

## 2023-01-18 ENCOUNTER — Telehealth (INDEPENDENT_AMBULATORY_CARE_PROVIDER_SITE_OTHER): Payer: Commercial Managed Care - POS | Admitting: Family Medicine

## 2023-01-18 DIAGNOSIS — E118 Type 2 diabetes mellitus with unspecified complications: Secondary | ICD-10-CM

## 2023-01-18 DIAGNOSIS — I119 Hypertensive heart disease without heart failure: Secondary | ICD-10-CM

## 2023-01-18 DIAGNOSIS — N183 Chronic kidney disease, stage 3 unspecified: Secondary | ICD-10-CM

## 2023-01-18 DIAGNOSIS — Z794 Long term (current) use of insulin: Secondary | ICD-10-CM

## 2023-01-18 NOTE — Telephone Encounter (Signed)
Pt is requesting to schedule appointment for labs, however lab orders are not in.  Please place orders for labs in pt chart and call pt back at [phone] to schedule for lab appointment.  Pt is requesting for lab visit on Wednesday.

## 2023-01-18 NOTE — Progress Notes (Signed)
Subjective:      Date: 01/18/2023 9:19 AM   Patient ID: [name] [name] is a 77 y.o. male.    Chief Complaint:  Chief Complaint   Patient presents with    Glucose Monitoring     Pt is following up       HPI:  HPI    HTN: Hypertension is well controlled at this point.  Patient is checking his blood pressure regularly at least 3-4 times a week.  Blood pressure at home is always in normal range.  Patient avoids adding salt and is doing regular cardiac exercise.  Average 112/80    DM : Patient is compliant with current medication.  Tries to exercise at least 3 times a week.  Watches type of diet and amount of carbohydrate intake.  Check lab work for diabetes regularly in our clinic.  Average 100  33  Insulin Glargin  10 lispro  Considering the patient is on the lower and I recommend to reduce the insulin Glargine given to 30 units a day and if the glucose level ever gets below 100 to knock it down to 20 units a day.  Patient will make another video visit in a week and he will check his blood sugar and his blood pressure daily.  Problem List:  Patient Active Problem List   Diagnosis    Type 2 diabetes mellitus with complication, with long-term current use of insulin    Heart attack    CAD (coronary artery disease)    Non-ischemic cardiomyopathy    Acute exacerbation of CHF (congestive heart failure)    PUD (peptic ulcer disease)    Acute dyspnea    CHF (congestive heart failure)    Headaches due to old head injury    Essential hypertension    Syncope and collapse    AICD (automatic cardioverter/defibrillator) present    Cardiomyopathy    SOB (shortness of breath)    GERD (gastroesophageal reflux disease)    Hypertensive heart disease without heart failure    Heart failure, unspecified    Nonischemic cardiomyopathy    Pure hypercholesterolemia    Chronic systolic congestive heart failure    Encounter for implantable defibrillator reprogramming or check    Episode of recurrent major depressive disorder, unspecified  depression episode severity    Stage 3 chronic kidney disease, 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    Anxiety and depression    COPD (chronic obstructive pulmonary disease)    HLD (hyperlipidemia)    Sleep apnea, obstructive    Scrotal edema    Hydrocele, unspecified hydrocele type    Acute on chronic congestive heart failure, unspecified heart failure type    Acute on chronic right-sided heart failure    Ischemic cardiomyopathy    CLL (chronic lymphocytic leukemia)    SVT (supraventricular tachycardia)    Heart failure    Iron deficiency anemia secondary to inadequate dietary iron intake    Heart failure, unspecified HF chronicity, unspecified heart failure type    Encounter for removal of tunneled central venous catheter (CVC) with port       Current Medications:  Outpatient Medications Marked as Taking for the 01/18/23 encounter (Telemedicine Visit) with Clearance Coots, MD   Medication Sig Dispense Refill    amiodarone (PACERONE) 200 MG tablet Take 1 tablet (200 mg) by mouth daily This is a maintenance dose. 30 tablet 0    apixaban (ELIQUIS) 5 MG Take 1 tablet (5 mg) by mouth every 12 (twelve)  hours For 1 week followed by 1 tablet twice daily 90 tablet 2    aspirin EC 81 MG EC tablet Take 1 tablet (81 mg) by mouth daily      Continuous Blood Gluc Sensor (FreeStyle Libre 3 Sensor) Misc Use 1 Device every 14 (fourteen) days 6 each 3    hydrALAZINE (APRESOLINE) 100 MG tablet Take 1 tablet (100 mg) by mouth every 8 (eight) hours 90 tablet 0    insulin lispro, 1 Unit Dial, 100 UNIT/ML pen-injector Inject 10 Units into the skin 3 (three) times daily before meals 9 mL 0    Insulin Pen Needle (B-D UF III MINI PEN NEEDLES) 31G X 5 MM Misc Use as directed daily 200 each 3    loratadine (CLARITIN) 10 MG tablet Take 1 tablet (10 mg) by mouth daily 5 tablet 0    metoprolol succinate XL (TOPROL-XL) 25 MG 24 hr tablet Take 1 tablet (25 mg) by mouth daily 90 tablet 3    niacin (NIASPAN) 500 MG CR tablet TAKE 1 TABLET BY MOUTH  TWICE DAILY 180 tablet 3    torsemide 40 MG Tab Take 2 tablets (80 mg) by mouth 2 (two) times daily 120 tablet 0    Toujeo SoloStar 300 UNIT/ML injection pen ADMINISTER 33 UNITS UNDER THE SKIN EVERY 12 HOURS         Allergies:  Allergies   Allergen Reactions    Allopurinol        Past Medical History:  Past Medical History:   Diagnosis Date    Acute CHF     NOV  & DEC 2018, - DIALYSIS CATH PLACED Aug 24 2017 TO REMOVE FLUID     Acute systolic (congestive) heart failure 08/2017    Anxiety     CAD (coronary artery disease)     Cardiomyopathy     nonischemic    CHF (congestive heart failure) 08/12/2014, 2013    Chronic obstructive pulmonary disease     POSSIBLE PER PT HE USES  SYNBICORT BID ABD  BREO INHALER PRN    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    Coronary artery disease 2011    CORONARY STENT PLACEMENT FOLLOWED BY [facility]    Depression     echocardiogram 02/2016, 11/2016, 09/2017, 12/20/2017    GERD (gastroesophageal reflux disease)     Heart attack 2011    and 2014    Hyperlipemia     Hypertension     ICD (implantable 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     PLACED 2014  [facility]  LAST INTERROGATION April 01 2018 REPORT REQUESTED    Ischemic cardiomyopathy     EF 15% ON ECHO 09-20-2017 DR GARG IN EPIC    Nonischemic cardiomyopathy     NSTEMI (non-ST elevated myocardial infarction)     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    Pacemaker 2014    MEDTRONIC ICD/PACEMAKER COMBO LAST INTERROGATION  12-12 2018    Pneumonia 09/2016    Primary cardiomyopathy     Ischemic cardiomyopathy EF 15% ON ECHO 09-20-2017 DR GARG IN EPIC    Sepsis 08/2017    Syncope and collapse     PRIOR TO ICD/PACEMAKER PLACED 2014    Type 2 diabetes mellitus, controlled DX 1998    BS  AVG 100  A1C 7.4 Dec 27 2017    [place]     PT SEE HIS PMD April 01 2018 RE THIS-TOLD TO SEE PMD BY [facility] JUNE 12 WHEN THEY SAW HIM FOR A CL  Past Surgical History:  Past Surgical History:   Procedure Laterality Date    BIV  11/10/2016    ICD [place]  UPGRADE     CARDIAC  CATHETERIZATION  03/2010    CORONARY STENT PLACEMENT X 2 PER PT; LM normal, LAD 70-80% distal lesion at apex, 20% lesion mid CFX    CARDIAC CATHETERIZATION  12/2012    CARDIAC DEFIBRILLATOR PLACEMENT  12/2012    [facility]    CARDIAC PACEMAKER PLACEMENT  2014    MEDTRONIC ICD/PACEMAKER PLACED [facility]    CIRCUMCISION  AGE 46    COLONOSCOPY  2009    CORRECTION HAMMER TOE      duodenal ulcer  1973    STRESS RELATED IN SCHOOL    ECHOCARDIOGRAM, TRANSTHORACIC  02/2016 11/2016 09/2017 12/2017    ECHOCARDIOGRAM, TRANSTHORACIC  02/2016,11/2016,12/20/2017    EGD N/A 08/15/2014    Procedure: EGD;  Surgeon: Loraine Maple, MD;  Location: [facility];  Service: Gastroenterology;  Laterality: N/A;  egd w/ bx    EGD  1975    EGD, COLONOSCOPY N/A 04/04/2018    Procedure: EGD with bxs, COLONOSCOPY with polypectomy and clipping;  Surgeon: Omer Jack, MD;  Location: [facility];  Service: Gastroenterology;  Laterality: N/A;    FRACTURE SURGERY  AGE 38     RT  ANKLE- CAST APPLIED    HERNIA REPAIR  AGE 46    LEFT INGUINAL HERNIA    MPI nuclear study  06/2016    orthopedic surgery  AGE 54    RT foot corrective - HAMMERTOE    RIGHT HEART CATH Right 09/09/2022    Procedure: Right Heart Cath;  Surgeon: Wandra Arthurs, MD;  Location[facility];  Service: Cardiovascular;  Laterality: Right;  tx from [place] INCL. CO AND SATS Right 12/22/2022    Procedure: Right Heart Cath ONLY incl. CO and Sats;  Surgeon: Day, Lillette Boxer, MD;  Location: [facility];  Service: Cardiovascular;  Laterality: Right;  Right Heart Cath with leave in PA cath for milrinone initiation    [place]    TUNNELED CATH PLACEMENT ([facility]) N/A 12/25/2022    Procedure: [facility];  Surgeon: Estelle Grumbles, MD;  Location: [facility];  Service: Interventional Radiology;  Laterality: N/A;    TUNNELED CATH REMOVAL ([facility]) Right 01/04/2023    Procedure: TUNNELED CATH REMOVAL;  Surgeon: Elizbeth Squires,  MD;  Location: [facility];  Service: Interventional Radiology;  Laterality: Right;       Family History:  Family History   Problem Relation Age of Onset    Breast cancer Mother     Heart attack Mother 64    Hypertension Mother     Diabetes Mother     Coronary artery disease Mother     Diabetes Brother     Heart attack Father     Hypertension Father        Social History:  Social History     Socioeconomic History    Marital status: Married     Spouse name: Architect    Number of children: 0   Occupational History    Occupation: Therapist, [place], [place]    Tobacco Use    Smoking status: Never    Smokeless tobacco: Never   Vaping Use    Vaping status: Never Used   Substance and Sexual Activity    Alcohol use: Not Currently  Drug use: No    Sexual activity: Not Currently     Partners: Female     Social Determinants of Health     Financial Resource Strain: Low Risk  (01/17/2023)    Overall Financial Resource Strain (CARDIA)     Difficulty of Paying Living Expenses: Not hard at all   Food Insecurity: No Food Insecurity (01/17/2023)    Hunger Vital Sign     Worried About Running Out of Food in the Last Year: Never true     Ran Out of Food in the Last Year: Never true   Transportation Needs: No Transportation Needs (01/17/2023)    PRAPARE - Armed forces logistics/support/administrative officer (Medical): No     Lack of Transportation (Non-Medical): No   Physical Activity: Insufficiently Active (01/17/2023)    Exercise Vital Sign     Days of Exercise per Week: 1 day     Minutes of Exercise per Session: 10 min   Stress: Stress Concern Present (01/17/2023)    [place]     Feeling of Stress : To some extent   Social Connections: Moderately Integrated (01/17/2023)    Social Connection and Isolation Panel [NHANES]     Frequency of Communication with Friends and Family: More than three times a week     Frequency of Social Gatherings with Friends and Family: Once a week      Attends Religious Services: More than 4 times per year     Active Member of [employer] or Organizations: No     Attends Archivist Meetings: Never     Marital Status: Married   Human resources officer Violence: Not At Risk (01/17/2023)    Humiliation, Afraid, Rape, and Kick questionnaire     Fear of Current or Ex-Partner: No     Emotionally Abused: No     Physically Abused: No     Sexually Abused: No   Housing Stability: Low Risk  (01/17/2023)    Housing Stability Vital Sign     Unable to Pay for Housing in the Last Year: No     Number of [place] in the Last Year: 1     Unstable Housing in the Last Year: No        The following sections were reviewed this encounter by the provider:   Tobacco  Allergies  Meds  Problems  Med Hx  Surg Hx  Fam Hx         Vitals:  There were no vitals taken for this visit.    ROS:  Constitutional: Negative for fever, chills and malaise/fatigue.   Respiratory: Negative for cough, shortness of breath and wheezing.    Cardiovascular: Negative for chest pain and palpitations.   Gastrointestinal: Negative for abdominal pain, diarrhea and constipation.   Musculoskeletal: Negative for joint pain.   Skin: Negative for rash.   Neurological: Negative for dizziness and headaches.   Psychiatric/Behavioral: The patient does not have insomnia.         Objective:        Assessment/Plan:       1. Type 2 diabetes mellitus with complication, with long-term current use of insulin  For now reduce glargine to 30 units a day if it ever goes below 100 reduce it to 20 units a day.  Check blood sugar twice a day    2. Hypertensive heart disease without heart failure  Patient symptoms are well controlled under current treatment.  Recommend to continue medication as prescribed.  Patient had problems getting on the video visit in our [place] system.  I contacted him with [facility].  Visit lasted over 25 minutes.      Verbal consent has been obtained from the patient, to conduct a video visit encounter, to minimize  exposure in the office.    Clearance Coots, MD

## 2023-01-18 NOTE — Progress Notes (Signed)
Have you seen any specialists since your last visit with [place]?  Yes, cardiology       The patient was informed that the following HM items are still outstanding:   colonoscopy, eye exam, and Tdap vaccine, statin use and advance directive

## 2023-01-19 NOTE — Telephone Encounter (Signed)
Called Pt and informed him of the recent provider's note. Pt expressed understanding and stated he will do it tomorrow

## 2023-01-20 ENCOUNTER — Other Ambulatory Visit (FREE_STANDING_LABORATORY_FACILITY): Payer: Commercial Managed Care - POS

## 2023-01-20 DIAGNOSIS — N183 Chronic kidney disease, stage 3 unspecified: Secondary | ICD-10-CM

## 2023-01-20 LAB — COMPREHENSIVE METABOLIC PANEL
ALT: 43 U/L (ref 0–55)
AST (SGOT): 27 U/L (ref 5–41)
Albumin/Globulin Ratio: 1.4 (ref 0.9–2.2)
Albumin: 3.8 g/dL (ref 3.5–5.0)
Alkaline Phosphatase: 108 U/L (ref 37–117)
Anion Gap: 13 (ref 5.0–15.0)
BUN: 76 mg/dL — ABNORMAL HIGH (ref 9.0–28.0)
Bilirubin, Total: 0.9 mg/dL (ref 0.2–1.2)
CO2: 22 mEq/L (ref 17–29)
Calcium: 9.5 mg/dL (ref 7.9–10.2)
Chloride: 107 mEq/L (ref 99–111)
Creatinine: 2.8 mg/dL — ABNORMAL HIGH (ref 0.5–1.5)
Globulin: 2.8 g/dL (ref 2.0–3.6)
Glucose: 69 mg/dL — ABNORMAL LOW (ref 70–100)
Potassium: 4 mEq/L (ref 3.5–5.3)
Protein, Total: 6.6 g/dL (ref 6.0–8.3)
Sodium: 142 mEq/L (ref 135–145)
eGFR: 22.7 mL/min/{1.73_m2} — AB (ref >=60)

## 2023-01-20 LAB — URINE MICROALBUMIN, RANDOM
Urine Creatinine, Random: 27.5 mg/dL
Urine Microalbumin, Random: 39 ug/ml — ABNORMAL HIGH (ref 0.0–30.0)
Urine Microalbumin/Creatinine Ratio: 142 ug/mg — ABNORMAL HIGH (ref 0–30)

## 2023-01-20 LAB — HEMOLYSIS INDEX(SOFT): Hemolysis Index: 5 Index (ref 0–24)

## 2023-01-21 ENCOUNTER — Other Ambulatory Visit (INDEPENDENT_AMBULATORY_CARE_PROVIDER_SITE_OTHER): Payer: Self-pay | Admitting: Family Medicine

## 2023-01-21 MED ORDER — ISOSORBIDE DINITRATE 40 MG PO TABS
40.0000 mg | ORAL_TABLET | Freq: Three times a day (TID) | ORAL | 0 refills | Status: DC
Start: 2023-01-21 — End: 2023-02-26

## 2023-01-21 NOTE — Telephone Encounter (Signed)
Please advise if appropriate     Medication(s) Requested: Isosorbide Dinitrate 10 mg     Medication(s) last refilled: Jan 04 2023, Qty 90, Refills 0  Last visit: January 18 2023  Patient has an upcoming appointment on January 28 2023.

## 2023-01-21 NOTE — Telephone Encounter (Signed)
Pt came in requesting a refill on Isosorbide Dinitrate 10mg  oral tabs. Pt is completely out.     Pt would like medication sent to the [facility] on file in [place], [place].     Ph: [phone]

## 2023-01-28 ENCOUNTER — Encounter (INDEPENDENT_AMBULATORY_CARE_PROVIDER_SITE_OTHER): Payer: Self-pay | Admitting: Family Medicine

## 2023-01-28 ENCOUNTER — Telehealth (INDEPENDENT_AMBULATORY_CARE_PROVIDER_SITE_OTHER): Payer: Commercial Managed Care - POS | Admitting: Family Medicine

## 2023-01-28 DIAGNOSIS — E118 Type 2 diabetes mellitus with unspecified complications: Secondary | ICD-10-CM

## 2023-01-28 DIAGNOSIS — Z794 Long term (current) use of insulin: Secondary | ICD-10-CM

## 2023-01-28 DIAGNOSIS — I119 Hypertensive heart disease without heart failure: Secondary | ICD-10-CM

## 2023-01-28 NOTE — Progress Notes (Signed)
Subjective:      Date: 01/28/2023 12:25 PM   Patient ID: [name] [name] is a 77 y.o. male.    Chief Complaint:  Chief Complaint   Patient presents with    Medication Refill     Follow up.       HPI:  HPI  HTN: Hypertension is well controlled at this point.  Patient is checking his blood pressure regularly at least 3-4 times a week.  Blood pressure at home is always in normal range.  Patient avoids adding salt and is doing regular cardiac exercise.  Average 101/65    DM : Patient is compliant with current medication.  Tries to exercise at least 3 times a week.  Watches type of diet and amount of carbohydrate intake.  Check lab work for diabetes regularly in our clinic.  Average 115      Problem List:  Patient Active Problem List   Diagnosis    Type 2 diabetes mellitus with complication, with long-term current use of insulin    Heart attack    CAD (coronary artery disease)    Non-ischemic cardiomyopathy    Acute exacerbation of CHF (congestive heart failure)    PUD (peptic ulcer disease)    Acute dyspnea    CHF (congestive heart failure)    Headaches due to old head injury    Essential hypertension    Syncope and collapse    AICD (automatic cardioverter/defibrillator) present    Cardiomyopathy    SOB (shortness of breath)    GERD (gastroesophageal reflux disease)    Hypertensive heart disease without heart failure    Heart failure, unspecified    Nonischemic cardiomyopathy    Pure hypercholesterolemia    Chronic systolic congestive heart failure    Encounter for implantable defibrillator reprogramming or check    Episode of recurrent major depressive disorder, unspecified depression episode severity    Stage 3 chronic kidney disease, 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    Anxiety and depression    COPD (chronic obstructive pulmonary disease)    HLD (hyperlipidemia)    Sleep apnea, obstructive    Scrotal edema    Hydrocele, unspecified hydrocele type    Acute on chronic congestive heart failure, unspecified  heart failure type    Acute on chronic right-sided heart failure    Ischemic cardiomyopathy    CLL (chronic lymphocytic leukemia)    SVT (supraventricular tachycardia)    Heart failure    Iron deficiency anemia secondary to inadequate dietary iron intake    Heart failure, unspecified HF chronicity, unspecified heart failure type    Encounter for removal of tunneled central venous catheter (CVC) with port       Current Medications:  Outpatient Medications Marked as Taking for the 01/28/23 encounter (Telemedicine Visit) with Zorita Pang, MD   Medication Sig Dispense Refill    amiodarone (PACERONE) 200 MG tablet Take 1 tablet (200 mg) by mouth daily This is a maintenance dose. 30 tablet 0    apixaban (ELIQUIS) 5 MG Take 1 tablet (5 mg) by mouth every 12 (twelve) hours For 1 week followed by 1 tablet twice daily 90 tablet 2    aspirin EC 81 MG EC tablet Take 1 tablet (81 mg) by mouth daily      Continuous Blood Gluc Sensor (FreeStyle Libre 3 Sensor) Misc Use 1 Device every 14 (fourteen) days 6 each 3    hydrALAZINE (APRESOLINE) 100 MG tablet Take 1 tablet (100 mg) by mouth every  8 (eight) hours 90 tablet 0    insulin glargine 100 UNIT/ML injection Inject 33 Units into the skin every 12 (twelve) hours 20 mL 3    insulin lispro, 1 Unit Dial, 100 UNIT/ML pen-injector Inject 10 Units into the skin 3 (three) times daily before meals 9 mL 0    Insulin Pen Needle (B-D UF III MINI PEN NEEDLES) 31G X 5 MM Misc Use as directed daily 200 each 3    isosorbide dinitrate (ISORDIL) 40 MG tablet Take 1 tablet (40 mg) by mouth 3 (three) times daily 90 tablet 0    loratadine (CLARITIN) 10 MG tablet Take 1 tablet (10 mg) by mouth daily 5 tablet 0    metoprolol succinate XL (TOPROL-XL) 25 MG 24 hr tablet Take 1 tablet (25 mg) by mouth daily 90 tablet 3    niacin (NIASPAN) 500 MG CR tablet TAKE 1 TABLET BY MOUTH TWICE DAILY 180 tablet 3    torsemide 40 MG Tab Take 2 tablets (80 mg) by mouth 2 (two) times daily 120 tablet 0    Toujeo  SoloStar 300 UNIT/ML injection pen ADMINISTER 33 UNITS UNDER THE SKIN EVERY 12 HOURS         Allergies:  Allergies   Allergen Reactions    Allopurinol        Past Medical History:  Past Medical History:   Diagnosis Date    Acute CHF     NOV  & DEC 2018, - DIALYSIS CATH PLACED Aug 24 2017 TO REMOVE FLUID     Acute systolic (congestive) heart failure 08/2017    Anxiety     CAD (coronary artery disease)     Cardiomyopathy     nonischemic    CHF (congestive heart failure) 08/12/2014, 2013    Chronic obstructive pulmonary disease     POSSIBLE PER PT HE USES  SYNBICORT BID ABD  BREO INHALER PRN    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    Coronary artery disease 2011    CORONARY STENT PLACEMENT FOLLOWED BY [facility]    Depression     echocardiogram 02/2016, 11/2016, 09/2017, 12/20/2017    GERD (gastroesophageal reflux disease)     Heart attack 2011    and 2014    Hyperlipemia     Hypertension     ICD (implantable 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     PLACED 2014  [facility]  LAST INTERROGATION April 01 2018 REPORT REQUESTED    Ischemic cardiomyopathy     EF 15% ON ECHO 09-20-2017 DR GARG IN EP[facility]    Nonischemic cardiomyopathy     NSTEMI (non-ST elevated myocardial infarction)     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    Pacemaker 2014    MEDTRONIC ICD/PACEMAKER COMBO LAST INTERROGATION  12-12 2018    Pneumonia 09/2016    Primary cardiomyopathy     Ischemic cardiomyopathy EF 15% ON ECHO 09-20-2017 DR GARG IN EP[facility]    Sepsis 08/2017    Syncope and collapse     PRIOR TO ICD/PACEMAKER PLACED 2014    Type 2 diabetes mellitus, controlled DX 1998    BS  AVG 100  A1C 7.4 Dec 27 2017    [place]eze     PT SEE HIS PMD April 01 2018 RE THIS-TOLD TO SEE PMD BY [facility] JUNE 12 WHEN THEY SAW HIM FOR A CL       Past Surgical History:  Past Surgical History:   Procedure Laterality Date    BIV  11/10/2016  ICD METRONIC  UPGRADE     CARDIAC CATHETERIZATION  03/2010    CORONARY STENT PLACEMENT X 2 PER PT; LM normal, LAD 70-80% distal lesion at apex, 20% lesion  mid CFX    CARDIAC CATHETERIZATION  12/2012    CARDIAC DEFIBRILLATOR PLACEMENT  12/2012    [facility]    CARDIAC PACEMAKER PLACEMENT  2014    MEDTRONIC ICD/PACEMAKER PLACED [facility]    CIRCUMCISION  AGE 64    COLONOSCOPY  2009    CORRECTION HAMMER TOE      duodenal ulcer  1973    STRESS RELATED IN SCHOOL    ECHOCARDIOGRAM, TRANSTHORACIC  02/2016 11/2016 09/2017 12/2017    ECHOCARDIOGRAM, TRANSTHORACIC  02/2016,11/2016,12/20/2017    EGD N/A 08/15/2014    Procedure: EGD;  Surgeon: Colon Branch, MD;  Location: [facility];  Service: Gastroenterology;  Laterality: N/A;  egd w/ bx    EGD  1975    EGD, COLONOSCOPY N/A 04/04/2018    Procedure: EGD with bxs, COLONOSCOPY with polypectomy and clipping;  Surgeon: Doyne Keel, MD;  Location: [facility];  Service: Gastroenterology;  Laterality: N/A;    FRACTURE SURGERY  AGE 79     RT  ANKLE- CAST APPLIED    HERNIA REPAIR  AGE 64    LEFT INGUINAL HERNIA    MPI nuclear study  06/2016    orthopedic surgery  AGE 71    RT foot corrective - HAMMERTOE    RIGHT HEART CATH Right 09/09/2022    Procedure: Right Heart Cath;  Surgeon: Burna Forts, MD;  [facility];  Service: Cardiovascular;  Laterality: Right;  tx from FO    RIGHT HEART CATH ONLY INCL. CO AND SATS Right 12/22/2022    Procedure: Right Heart Cath ONLY incl. CO and Sats;  Surgeon: Day, Bertram Millard, MD;  Location: [facility];  Service: Cardiovascular;  Laterality: Right;  Right Heart Cath with leave in PA cath for milrinone initiation    TONSILLECTOMY AND ADENOIDECTOMY  1954    TUNNELED CATH PLACEMENT ([facility]) N/A 12/25/2022    Procedure: [facility];  Surgeon: Tamala Bari, MD;  Location: [facility];  Service: Interventional Radiology;  Laterality: N/A;    TUNNELED CATH REMOVAL ([facility]ATH) Right 01/04/2023    Procedure: TUNNELED CATH REMOVAL;  Surgeon: Rex Kras, MD;  Location: [facility];  Service: Interventional Radiology;  Laterality: Right;       Family History:  Family  History   Problem Relation Age of Onset    Breast cancer Mother     Heart attack Mother 27    Hypertension Mother     Diabetes Mother     Coronary artery disease Mother     Diabetes Brother     Heart attack Father     Hypertension Father        Social History:  Social History     Socioeconomic History    Marital status: Married     Spouse name: Teacher, music    Number of children: 0   Occupational History    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, history    Tobacco Use    Smoking status: Never    Smokeless tobacco: Never   Vaping Use    Vaping status: Never Used   Substance and Sexual Activity    Alcohol use: Not Currently    Drug use: No    Sexual activity: Not Currently     Partners: Female  Social Determinants of Health     Financial Resource Strain: Low Risk  (01/17/2023)    Overall Financial Resource Strain (CARDIA)     Difficulty of Paying Living Expenses: Not hard at all   Food Insecurity: No Food Insecurity (01/17/2023)    Hunger Vital Sign     Worried About Running Out of Food in the Last Year: Never true     Ran Out of Food in the Last Year: Never true   Transportation Needs: No Transportation Needs (01/17/2023)    PRAPARE - Therapist, art (Medical): No     Lack of Transportation (Non-Medical): No   Physical Activity: Insufficiently Active (01/17/2023)    Exercise Vital Sign     Days of Exercise per Week: 1 day     Minutes of Exercise per Session: 10 min   Stress: Stress Concern Present (01/17/2023)    [employer] of Occupational Health - Occupational Stress Questionnaire     Feeling of Stress : To some extent   Social Connections: Moderately Integrated (01/17/2023)    Social Connection and Isolation Panel [NHANES]     Frequency of Communication with Friends and Family: More than three times a week     Frequency of Social Gatherings with Friends and Family: Once a week     Attends Religious Services: More than 4 times per year     Active Member of [employer] or Organizations: No     Attends  Banker Meetings: Never     Marital Status: Married   Catering manager Violence: Not At Risk (01/17/2023)    Humiliation, Afraid, Rape, and Kick questionnaire     Fear of Current or Ex-Partner: No     Emotionally Abused: No     Physically Abused: No     Sexually Abused: No   Housing Stability: Low Risk  (01/17/2023)    Housing Stability Vital Sign     Unable to Pay for Housing in the Last Year: No     Number of Places Lived in the Last Year: 1     Unstable Housing in the Last Year: No        The following sections were reviewed this encounter by the provider:        Vitals:  There were no vitals taken for this visit.    ROS:  Constitutional: Negative for fever, chills and malaise/fatigue.   Respiratory: Negative for cough, shortness of breath and wheezing.    Cardiovascular: Negative for chest pain and palpitations.   Gastrointestinal: Negative for abdominal pain, diarrhea and constipation.   Musculoskeletal: Negative for joint pain.   Skin: Negative for rash.   Neurological: Negative for dizziness and headaches.   Psychiatric/Behavioral: The patient does not have insomnia.         Objective:     Physical/Virtual Exam/Observation    Constitutional: Patient is oriented to person, place, and time. Appears well-developed and well-nourished. :   Head: Normocephalic and atraumatic.   Eyes: Conjunctivae and EOM are normal. Pupils are equal, round, and reactive to light.   Pulmonary/Chest: Effort normal and breath sounds normal. Has no wheezes.   Musculoskeletal: Normal range of motion.   Neurological: Patient is alert and oriented to person, place, and time.   Psychiatric: Patient has a normal mood and affect.      Assessment/Plan:       1. Type 2 diabetes mellitus with complication, with long-term current use of insulin  Patient symptoms  are well controlled under current treatment.  Recommend to continue medication as prescribed.    2. Hypertensive heart disease without heart failure  Patient symptoms are well  controlled under current treatment.  Recommend to continue medication as prescribed.      Zorita Pang, MD

## 2023-01-28 NOTE — Progress Notes (Signed)
Have you seen any specialists since your last visit with [place]?  Yes. Oncologist, cardiologist      The patient was informed that the following HM items are still outstanding:   colonoscopy, eye exam, and Statin Use and Tdap.

## 2023-01-31 ENCOUNTER — Other Ambulatory Visit (INDEPENDENT_AMBULATORY_CARE_PROVIDER_SITE_OTHER): Payer: Commercial Managed Care - POS | Admitting: Cardiovascular Disease

## 2023-02-01 ENCOUNTER — Encounter (INDEPENDENT_AMBULATORY_CARE_PROVIDER_SITE_OTHER): Payer: Self-pay | Admitting: Family Medicine

## 2023-02-03 ENCOUNTER — Encounter (INDEPENDENT_AMBULATORY_CARE_PROVIDER_SITE_OTHER): Payer: Self-pay | Admitting: Family Medicine

## 2023-02-04 ENCOUNTER — Other Ambulatory Visit (INDEPENDENT_AMBULATORY_CARE_PROVIDER_SITE_OTHER): Payer: Self-pay | Admitting: Family Medicine

## 2023-02-04 DIAGNOSIS — Z794 Long term (current) use of insulin: Secondary | ICD-10-CM

## 2023-02-04 NOTE — Telephone Encounter (Signed)
Name, strength, directions of requested refill(s):  Toujeo SoloStar 300 UNIT/ML injection pen (Order [id number])  How much medication is remaining:     Pharmacy to send refill to or patient to pick up rx from office (mark requested pharmacy in BOLD):      [id number] - [place], [place] - [address] AT [facility] [address]  [address]  [place] [address]  Phone: [phone] Fax: [phone]    [facility]  [address]  [place] [address]  Phone: [phone] Fax: [phone]        Please mark "X" next to the preferred call back number:    Mobile: [phone] (mobile) x   Home: [phone] (home)    Work: @WORKPHONE @        Medication refill request, see above. Thank you   Patient has been informed that medication refill requests should be called in up to one week prior to running out of medication.    Additional Notes: Per pt he will be leaving town on 02/05/23 and is requesting for fills before then. Please advise. Thank you!   Last Visit: MM/DD/YYYY  Next Visit: MM/DD/YYYY

## 2023-02-04 NOTE — Telephone Encounter (Signed)
Medication(s) Requested:  Toujeo SoloStar 300 UNIT/ML injection pen   Medication(s) last refilled: Jan 09 2023, Qty  0 , Refills 0  Last visit: January 28 2023  Patient does not have an upcoming appointment

## 2023-02-05 MED ORDER — TOUJEO SOLOSTAR 300 UNIT/ML SC SOPN
PEN_INJECTOR | SUBCUTANEOUS | 3 refills | Status: DC
Start: 2023-02-05 — End: 2023-02-10

## 2023-02-10 ENCOUNTER — Inpatient Hospital Stay
Admission: EM | Admit: 2023-02-10 | Discharge: 2023-02-12 | DRG: 377 | Disposition: A | Discharge status: Home Health | Payer: Commercial Managed Care - POS | Attending: Student in an Organized Health Care Education/Training Program | Admitting: Student in an Organized Health Care Education/Training Program

## 2023-02-10 ENCOUNTER — Encounter (INDEPENDENT_AMBULATORY_CARE_PROVIDER_SITE_OTHER): Payer: Self-pay | Admitting: Nurse Practitioner

## 2023-02-10 ENCOUNTER — Ambulatory Visit (INDEPENDENT_AMBULATORY_CARE_PROVIDER_SITE_OTHER): Payer: Commercial Managed Care - POS | Admitting: Nurse Practitioner

## 2023-02-10 ENCOUNTER — Encounter (INDEPENDENT_AMBULATORY_CARE_PROVIDER_SITE_OTHER): Payer: Self-pay | Admitting: Family Medicine

## 2023-02-10 DIAGNOSIS — N189 Chronic kidney disease, unspecified: Secondary | ICD-10-CM

## 2023-02-10 DIAGNOSIS — I252 Old myocardial infarction: Secondary | ICD-10-CM

## 2023-02-10 DIAGNOSIS — I428 Other cardiomyopathies: Secondary | ICD-10-CM | POA: Diagnosis present

## 2023-02-10 DIAGNOSIS — Z7982 Long term (current) use of aspirin: Secondary | ICD-10-CM

## 2023-02-10 DIAGNOSIS — Z79899 Other long term (current) drug therapy: Secondary | ICD-10-CM

## 2023-02-10 DIAGNOSIS — Z794 Long term (current) use of insulin: Secondary | ICD-10-CM

## 2023-02-10 DIAGNOSIS — F419 Anxiety disorder, unspecified: Secondary | ICD-10-CM | POA: Diagnosis present

## 2023-02-10 DIAGNOSIS — Z8719 Personal history of other diseases of the digestive system: Secondary | ICD-10-CM

## 2023-02-10 DIAGNOSIS — F32A Depression, unspecified: Secondary | ICD-10-CM | POA: Diagnosis present

## 2023-02-10 DIAGNOSIS — D689 Coagulation defect, unspecified: Secondary | ICD-10-CM | POA: Diagnosis present

## 2023-02-10 DIAGNOSIS — Z515 Encounter for palliative care: Secondary | ICD-10-CM

## 2023-02-10 DIAGNOSIS — Z8679 Personal history of other diseases of the circulatory system: Secondary | ICD-10-CM

## 2023-02-10 DIAGNOSIS — Z6825 Body mass index (BMI) 25.0-25.9, adult: Secondary | ICD-10-CM

## 2023-02-10 DIAGNOSIS — Z86718 Personal history of other venous thrombosis and embolism: Secondary | ICD-10-CM

## 2023-02-10 DIAGNOSIS — Z8711 Personal history of peptic ulcer disease: Secondary | ICD-10-CM

## 2023-02-10 DIAGNOSIS — I272 Pulmonary hypertension, unspecified: Secondary | ICD-10-CM | POA: Diagnosis present

## 2023-02-10 DIAGNOSIS — Z856 Personal history of leukemia: Secondary | ICD-10-CM

## 2023-02-10 DIAGNOSIS — I130 Hypertensive heart and chronic kidney disease with heart failure and stage 1 through stage 4 chronic kidney disease, or unspecified chronic kidney disease: Secondary | ICD-10-CM | POA: Diagnosis present

## 2023-02-10 DIAGNOSIS — Z7901 Long term (current) use of anticoagulants: Secondary | ICD-10-CM

## 2023-02-10 DIAGNOSIS — E663 Overweight: Secondary | ICD-10-CM | POA: Diagnosis present

## 2023-02-10 DIAGNOSIS — E1122 Type 2 diabetes mellitus with diabetic chronic kidney disease: Secondary | ICD-10-CM | POA: Diagnosis present

## 2023-02-10 DIAGNOSIS — E785 Hyperlipidemia, unspecified: Secondary | ICD-10-CM | POA: Diagnosis present

## 2023-02-10 DIAGNOSIS — D696 Thrombocytopenia, unspecified: Secondary | ICD-10-CM | POA: Diagnosis present

## 2023-02-10 DIAGNOSIS — J449 Chronic obstructive pulmonary disease, unspecified: Secondary | ICD-10-CM | POA: Diagnosis present

## 2023-02-10 DIAGNOSIS — Z95810 Presence of automatic (implantable) cardiac defibrillator: Secondary | ICD-10-CM

## 2023-02-10 DIAGNOSIS — I081 Rheumatic disorders of both mitral and tricuspid valves: Secondary | ICD-10-CM | POA: Diagnosis present

## 2023-02-10 DIAGNOSIS — N184 Chronic kidney disease, stage 4 (severe): Secondary | ICD-10-CM | POA: Diagnosis present

## 2023-02-10 DIAGNOSIS — E118 Type 2 diabetes mellitus with unspecified complications: Secondary | ICD-10-CM

## 2023-02-10 DIAGNOSIS — I5084 End stage heart failure: Secondary | ICD-10-CM | POA: Diagnosis present

## 2023-02-10 DIAGNOSIS — C9110 Chronic lymphocytic leukemia of B-cell type not having achieved remission: Secondary | ICD-10-CM | POA: Diagnosis present

## 2023-02-10 DIAGNOSIS — I509 Heart failure, unspecified: Secondary | ICD-10-CM

## 2023-02-10 DIAGNOSIS — D630 Anemia in neoplastic disease: Secondary | ICD-10-CM | POA: Diagnosis present

## 2023-02-10 DIAGNOSIS — D508 Other iron deficiency anemias: Secondary | ICD-10-CM

## 2023-02-10 DIAGNOSIS — Z955 Presence of coronary angioplasty implant and graft: Secondary | ICD-10-CM

## 2023-02-10 DIAGNOSIS — I5023 Acute on chronic systolic (congestive) heart failure: Secondary | ICD-10-CM

## 2023-02-10 DIAGNOSIS — D62 Acute posthemorrhagic anemia: Secondary | ICD-10-CM

## 2023-02-10 DIAGNOSIS — D6489 Other specified anemias: Secondary | ICD-10-CM

## 2023-02-10 DIAGNOSIS — K922 Gastrointestinal hemorrhage, unspecified: Principal | ICD-10-CM

## 2023-02-10 DIAGNOSIS — I2510 Atherosclerotic heart disease of native coronary artery without angina pectoris: Secondary | ICD-10-CM | POA: Diagnosis present

## 2023-02-10 DIAGNOSIS — D649 Anemia, unspecified: Secondary | ICD-10-CM

## 2023-02-10 DIAGNOSIS — I119 Hypertensive heart disease without heart failure: Secondary | ICD-10-CM

## 2023-02-10 DIAGNOSIS — R7989 Other specified abnormal findings of blood chemistry: Secondary | ICD-10-CM

## 2023-02-10 DIAGNOSIS — K3182 Dieulafoy lesion (hemorrhagic) of stomach and duodenum: Principal | ICD-10-CM | POA: Diagnosis present

## 2023-02-10 DIAGNOSIS — I959 Hypotension, unspecified: Secondary | ICD-10-CM | POA: Diagnosis present

## 2023-02-10 DIAGNOSIS — I472 Ventricular tachycardia, unspecified: Secondary | ICD-10-CM | POA: Diagnosis present

## 2023-02-10 DIAGNOSIS — I10 Essential (primary) hypertension: Secondary | ICD-10-CM

## 2023-02-10 DIAGNOSIS — Z86010 Personal history of colonic polyps: Secondary | ICD-10-CM

## 2023-02-10 DIAGNOSIS — D509 Iron deficiency anemia, unspecified: Secondary | ICD-10-CM | POA: Insufficient documentation

## 2023-02-10 DIAGNOSIS — D631 Anemia in chronic kidney disease: Secondary | ICD-10-CM | POA: Diagnosis present

## 2023-02-10 DIAGNOSIS — I255 Ischemic cardiomyopathy: Secondary | ICD-10-CM | POA: Diagnosis present

## 2023-02-10 LAB — CBC AND DIFFERENTIAL
Absolute NRBC: 0.07 10*3/uL — ABNORMAL HIGH (ref 0.00–0.00)
Basophils Absolute Automated: 0.01 10*3/uL (ref 0.00–0.08)
Basophils Automated: 0.1 %
Eosinophils Absolute Automated: 0.06 10*3/uL (ref 0.00–0.44)
Eosinophils Automated: 0.7 %
Hematocrit: 20.7 % — ABNORMAL LOW (ref 37.6–49.6)
Hgb: 6.3 g/dL — ABNORMAL LOW (ref 12.5–17.1)
Immature Granulocytes Absolute: 0.03 10*3/uL (ref 0.00–0.07)
Immature Granulocytes: 0.3 %
Instrument Absolute Neutrophil Count: 4.41 10*3/uL (ref 1.10–6.33)
Lymphocytes Absolute Automated: 4.04 10*3/uL — ABNORMAL HIGH (ref 0.42–3.22)
Lymphocytes Automated: 45.9 %
MCH: 35 pg — ABNORMAL HIGH (ref 25.1–33.5)
MCHC: 30.4 g/dL — ABNORMAL LOW (ref 31.5–35.8)
MCV: 115 fL — ABNORMAL HIGH (ref 78.0–96.0)
MPV: 12.5 fL (ref 8.9–12.5)
Monocytes Absolute Automated: 0.25 10*3/uL (ref 0.21–0.85)
Monocytes: 2.8 %
Neutrophils Absolute: 4.41 10*3/uL (ref 1.10–6.33)
Neutrophils: 50.2 %
Nucleated RBC: 0.8 /100 WBC — ABNORMAL HIGH (ref 0.0–0.0)
Platelets: 130 10*3/uL — ABNORMAL LOW (ref 142–346)
RBC: 1.8 10*6/uL — ABNORMAL LOW (ref 4.20–5.90)
RDW: 17 % — ABNORMAL HIGH (ref 11–15)
WBC: 8.8 10*3/uL (ref 3.10–9.50)

## 2023-02-10 LAB — PT AND APTT
PT INR: 1.9 — ABNORMAL HIGH (ref 0.9–1.1)
PT: 22.1 s — ABNORMAL HIGH (ref 10.1–12.9)
PTT: 33 s (ref 27–39)

## 2023-02-10 LAB — CROSSMATCH PRBC, 1 UNIT
Expiration Date: 202405162359
Expiration Date: 202405192359
ISBT CODE: 5100
ISBT CODE: 5100
Product Status: TRANSFUSED
Product Status: TRANSFUSED
Unit Blood Type: O POS
Unit Blood Type: O POS

## 2023-02-10 LAB — COMPREHENSIVE METABOLIC PANEL
ALT: 26 U/L (ref 0–55)
AST (SGOT): 34 U/L (ref 5–41)
Albumin/Globulin Ratio: 1.2 (ref 0.9–2.2)
Albumin: 3.3 g/dL — ABNORMAL LOW (ref 3.5–5.0)
Alkaline Phosphatase: 86 U/L (ref 37–117)
Anion Gap: 12 (ref 5.0–15.0)
BUN: 62 mg/dL — ABNORMAL HIGH (ref 9.0–28.0)
Bilirubin, Total: 0.7 mg/dL (ref 0.2–1.2)
CO2: 22 mEq/L (ref 17–29)
Calcium: 8.9 mg/dL (ref 7.9–10.2)
Chloride: 102 mEq/L (ref 99–111)
Creatinine: 2.6 mg/dL — ABNORMAL HIGH (ref 0.5–1.5)
Globulin: 2.8 g/dL (ref 2.0–3.6)
Glucose: 360 mg/dL — ABNORMAL HIGH (ref 70–100)
Potassium: 4.2 mEq/L (ref 3.5–5.3)
Protein, Total: 6.1 g/dL (ref 6.0–8.3)
Sodium: 136 mEq/L (ref 135–145)
eGFR: 24.8 mL/min/{1.73_m2} — AB (ref >=60)

## 2023-02-10 LAB — URINALYSIS WITH REFLEX TO MICROSCOPIC EXAM - REFLEX TO CULTURE
Bilirubin, UA: NEGATIVE
Blood, UA: NEGATIVE
Ketones UA: NEGATIVE
Leukocyte Esterase, UA: NEGATIVE
Nitrite, UA: NEGATIVE
Protein, UR: NEGATIVE
Specific Gravity UA: 1.011 (ref 1.001–1.035)
Urine pH: 6 (ref 5.0–8.0)
Urobilinogen, UA: NORMAL mg/dL

## 2023-02-10 LAB — CELL MORPHOLOGY
Cell Morphology: ABNORMAL — AB
Platelet Estimate: DECREASED — AB

## 2023-02-10 LAB — NT-PROBNP
NT-proBNP: 5990 pg/mL — ABNORMAL HIGH (ref 0–450)
NT-proBNP: 5600 pg/mL — ABNORMAL HIGH (ref 0–450)

## 2023-02-10 LAB — HEMOGLOBIN AND HEMATOCRIT
Hematocrit: 22.6 % — ABNORMAL LOW (ref 37.6–49.6)
Hgb: 6.9 g/dL — ABNORMAL LOW (ref 12.5–17.1)

## 2023-02-10 LAB — HIGH SENSITIVITY TROPONIN-I WITH DELTA: hs Troponin-I: 39.1 ng/L — AB

## 2023-02-10 LAB — ANTIBODY SCREEN: AB Screen Gel: NEGATIVE

## 2023-02-10 LAB — WHOLE BLOOD GLUCOSE POCT
Whole Blood Glucose POCT: 249 mg/dL — ABNORMAL HIGH (ref 70–100)
Whole Blood Glucose POCT: 246 mg/dL — ABNORMAL HIGH (ref 70–100)

## 2023-02-10 LAB — ABO/RH: ABO Rh: O POS

## 2023-02-10 LAB — HIGH SENSITIVITY TROPONIN-I: hs Troponin-I: 43.5 ng/L — AB

## 2023-02-10 MED ORDER — PEG 3350-KCL-NABCB-NACL+/-NASULF PO SOLR (WRAP)
2000.0000 mL | Freq: Once | ORAL | Status: AC
Start: 2023-02-10 — End: 2023-02-10
  Administered 2023-02-10: 2000 mL via ORAL
  Filled 2023-02-10: qty 2000

## 2023-02-10 MED ORDER — GLUCAGON 1 MG IJ SOLR (WRAP)
1.0000 mg | INTRAMUSCULAR | Status: DC | PRN
Start: 2023-02-10 — End: 2023-02-12

## 2023-02-10 MED ORDER — SODIUM CHLORIDE 0.9 % IV SOLN
INTRAVENOUS | Status: DC | PRN
Start: 2023-02-10 — End: 2023-02-12

## 2023-02-10 MED ORDER — BENZONATATE 100 MG PO CAPS
100.0000 mg | ORAL_CAPSULE | Freq: Three times a day (TID) | ORAL | Status: DC | PRN
Start: 2023-02-10 — End: 2023-02-12
  Administered 2023-02-11: 100 mg via ORAL
  Filled 2023-02-10: qty 1

## 2023-02-10 MED ORDER — ONDANSETRON 4 MG PO TBDP
4.0000 mg | ORAL_TABLET | Freq: Four times a day (QID) | ORAL | Status: DC | PRN
Start: 2023-02-10 — End: 2023-02-12

## 2023-02-10 MED ORDER — INSULIN GLARGINE 100 UNIT/ML SC SOLN
15.0000 [IU] | Freq: Every morning | SUBCUTANEOUS | Status: DC
Start: 2023-02-11 — End: 2023-02-12
  Administered 2023-02-11 – 2023-02-12 (×2): 15 [IU] via SUBCUTANEOUS
  Filled 2023-02-10 (×2): qty 15

## 2023-02-10 MED ORDER — INSULIN LISPRO 100 UNIT/ML SOLN (WRAP)
1.0000 [IU] | Status: DC
Start: 2023-02-10 — End: 2023-02-12
  Administered 2023-02-10 (×2): 2 [IU] via SUBCUTANEOUS
  Administered 2023-02-11: 3 [IU] via SUBCUTANEOUS
  Administered 2023-02-11: 1 [IU] via SUBCUTANEOUS
  Administered 2023-02-11 – 2023-02-12 (×2): 2 [IU] via SUBCUTANEOUS
  Administered 2023-02-12: 1 [IU] via SUBCUTANEOUS
  Filled 2023-02-10: qty 3
  Filled 2023-02-10: qty 6
  Filled 2023-02-10: qty 3
  Filled 2023-02-10 (×3): qty 6
  Filled 2023-02-10: qty 9

## 2023-02-10 MED ORDER — ONDANSETRON HCL 4 MG/2ML IJ SOLN
4.0000 mg | Freq: Four times a day (QID) | INTRAMUSCULAR | Status: DC | PRN
Start: 2023-02-10 — End: 2023-02-12

## 2023-02-10 MED ORDER — ISOSORBIDE DINITRATE 20 MG PO TABS
40.0000 mg | ORAL_TABLET | Freq: Three times a day (TID) | ORAL | Status: DC
Start: 2023-02-10 — End: 2023-02-10
  Filled 2023-02-10 (×4): qty 2

## 2023-02-10 MED ORDER — NALOXONE HCL 0.4 MG/ML IJ SOLN (WRAP)
0.2000 mg | INTRAMUSCULAR | Status: DC | PRN
Start: 2023-02-10 — End: 2023-02-12

## 2023-02-10 MED ORDER — ISOSORBIDE DINITRATE 20 MG PO TABS
40.0000 mg | ORAL_TABLET | Freq: Three times a day (TID) | ORAL | Status: DC
Start: 2023-02-11 — End: 2023-02-12
  Administered 2023-02-12: 40 mg via ORAL
  Filled 2023-02-10 (×12): qty 2

## 2023-02-10 MED ORDER — HYDRALAZINE HCL 50 MG PO TABS
100.0000 mg | ORAL_TABLET | Freq: Three times a day (TID) | ORAL | Status: DC
Start: 2023-02-10 — End: 2023-02-10

## 2023-02-10 MED ORDER — PANTOPRAZOLE SODIUM 40 MG IV SOLR
40.0000 mg | Freq: Once | INTRAVENOUS | Status: AC
Start: 2023-02-10 — End: 2023-02-10
  Administered 2023-02-10: 40 mg via INTRAVENOUS
  Filled 2023-02-10: qty 40

## 2023-02-10 MED ORDER — NIACIN ER (ANTIHYPERLIPIDEMIC) 500 MG PO TBCR
500.0000 mg | EXTENDED_RELEASE_TABLET | Freq: Two times a day (BID) | ORAL | Status: DC
Start: 2023-02-10 — End: 2023-02-12
  Administered 2023-02-10 – 2023-02-12 (×5): 500 mg via ORAL
  Filled 2023-02-10 (×9): qty 1

## 2023-02-10 MED ORDER — PEG 3350-KCL-NABCB-NACL+/-NASULF PO SOLR (WRAP)
2000.0000 mL | Freq: Once | ORAL | Status: AC
Start: 2023-02-11 — End: 2023-02-11
  Administered 2023-02-11: 2000 mL via ORAL

## 2023-02-10 MED ORDER — ATORVASTATIN CALCIUM 20 MG PO TABS
40.0000 mg | ORAL_TABLET | Freq: Every evening | ORAL | Status: DC
Start: 2023-02-10 — End: 2023-02-12
  Administered 2023-02-10 – 2023-02-11 (×2): 40 mg via ORAL
  Filled 2023-02-10 (×2): qty 2

## 2023-02-10 MED ORDER — DEXTROSE 10 % IV BOLUS
12.5000 g | INTRAVENOUS | Status: DC | PRN
Start: 2023-02-10 — End: 2023-02-12

## 2023-02-10 MED ORDER — FUROSEMIDE 10 MG/ML IJ SOLN
80.0000 mg | Freq: Two times a day (BID) | INTRAMUSCULAR | Status: DC
Start: 2023-02-10 — End: 2023-02-12
  Administered 2023-02-10 – 2023-02-12 (×3): 80 mg via INTRAVENOUS
  Filled 2023-02-10 (×3): qty 8

## 2023-02-10 MED ORDER — INSULIN GLARGINE 100 UNIT/ML SC SOLN
15.0000 [IU] | Freq: Two times a day (BID) | SUBCUTANEOUS | Status: DC
Start: 2023-02-10 — End: 2023-02-10

## 2023-02-10 MED ORDER — HYDRALAZINE HCL 50 MG PO TABS
100.0000 mg | ORAL_TABLET | Freq: Three times a day (TID) | ORAL | Status: DC
Start: 2023-02-11 — End: 2023-02-12
  Administered 2023-02-12 (×2): 100 mg via ORAL
  Filled 2023-02-10 (×3): qty 2

## 2023-02-10 MED ORDER — PANTOPRAZOLE SODIUM 40 MG IV SOLR
40.0000 mg | Freq: Two times a day (BID) | INTRAVENOUS | Status: DC
Start: 2023-02-11 — End: 2023-02-12
  Administered 2023-02-11 – 2023-02-12 (×4): 40 mg via INTRAVENOUS
  Filled 2023-02-10 (×4): qty 40

## 2023-02-10 MED ORDER — BENZOCAINE-MENTHOL MT LOZG (WRAP)
1.0000 | LOZENGE | OROMUCOSAL | Status: DC | PRN
Start: 2023-02-10 — End: 2023-02-12

## 2023-02-10 MED ORDER — GLUCOSE 40 % PO GEL (WRAP)
15.0000 g | ORAL | Status: DC | PRN
Start: 2023-02-10 — End: 2023-02-12

## 2023-02-10 MED ORDER — ACETAMINOPHEN 325 MG PO TABS
650.0000 mg | ORAL_TABLET | Freq: Three times a day (TID) | ORAL | Status: DC | PRN
Start: 2023-02-10 — End: 2023-02-12

## 2023-02-10 MED ORDER — AMIODARONE HCL 200 MG PO TABS
200.0000 mg | ORAL_TABLET | Freq: Every day | ORAL | 3 refills | Status: DC
Start: 2023-02-10 — End: 2023-03-29

## 2023-02-10 MED ORDER — PANTOPRAZOLE SODIUM 40 MG IV SOLR
40.0000 mg | Freq: Every day | INTRAVENOUS | Status: DC
Start: 2023-02-11 — End: 2023-02-10

## 2023-02-10 MED ORDER — AMIODARONE HCL 200 MG PO TABS
200.0000 mg | ORAL_TABLET | Freq: Every day | ORAL | Status: DC
Start: 2023-02-11 — End: 2023-02-12
  Administered 2023-02-11 – 2023-02-12 (×2): 200 mg via ORAL
  Filled 2023-02-10 (×2): qty 1

## 2023-02-10 MED ORDER — DEXTROSE 50 % IV SOLN
12.5000 g | INTRAVENOUS | Status: DC | PRN
Start: 2023-02-10 — End: 2023-02-12

## 2023-02-10 MED ORDER — METOPROLOL SUCCINATE ER 25 MG PO TB24
25.0000 mg | ORAL_TABLET | Freq: Every day | ORAL | Status: DC
Start: 2023-02-11 — End: 2023-02-12
  Administered 2023-02-12: 25 mg via ORAL
  Filled 2023-02-10 (×2): qty 1

## 2023-02-10 MED ORDER — CARBOXYMETHYLCELLULOSE SODIUM 0.5 % OP SOLN
1.0000 [drp] | Freq: Three times a day (TID) | OPHTHALMIC | Status: DC | PRN
Start: 2023-02-10 — End: 2023-02-12

## 2023-02-10 MED ORDER — SALINE SPRAY 0.65 % NA SOLN
2.0000 | NASAL | Status: DC | PRN
Start: 2023-02-10 — End: 2023-02-12

## 2023-02-10 MED ORDER — FUROSEMIDE 10 MG/ML IJ SOLN
20.0000 mg | Freq: Once | INTRAMUSCULAR | Status: AC | PRN
Start: 2023-02-10 — End: 2023-02-11
  Administered 2023-02-11: 20 mg via INTRAVENOUS
  Filled 2023-02-10: qty 2

## 2023-02-10 NOTE — Plan of Care (Signed)
Problem: Moderate/High Fall Risk Score >5  Goal: Patient will remain free of falls  Outcome: Progressing  Flowsheets (Taken 02/10/2023 1600 by Penelope Galas, RN)  Moderate Risk (6-13):   MOD-Consider activation of bed alarm if appropriate   MOD-Apply bed exit alarm if patient is confused   MOD-Consider a move closer to Nurses Station   MOD-Floor mat at bedside (where available) if appropriate   MOD-Place bedside commode and assistive devices out of sight when not in use   MOD-Remain with patient during toileting   MOD-Re-orient confused patients   MOD-Perform dangle, stand, walk (DSW) prior to mobilization   MOD-Utilize diversion activities   MOD-Request PT/OT consult order for patients with gait/mobility impairment   MOD-Use gait belt when appropriate   MOD- Consider video monitoring     Problem: Compromised Sensory Perception  Goal: Sensory Perception Interventions  Outcome: Progressing  Flowsheets (Taken 02/10/2023 2020)  Sensory Perception Interventions: Offload heels, Pad bony prominences, Reposition q 2hrs/turn Clock, Q2 hour skin assessment under devices if present     Problem: Compromised Moisture  Goal: Moisture level Interventions  Outcome: Progressing  Flowsheets (Taken 02/10/2023 2020)  Moisture level Interventions: Moisture wicking products, Moisture barrier cream     Problem: Compromised Activity/Mobility  Goal: Activity/Mobility Interventions  Outcome: Progressing  Flowsheets (Taken 02/10/2023 2020)  Activity/Mobility Interventions: Pad bony prominences, TAP Seated positioning system when OOB, Promote PMP, Reposition q 2 hrs / turn clock, Offload heels     Problem: Compromised Friction/Shear  Goal: Friction and Shear Interventions  Outcome: Progressing  Flowsheets (Taken 02/10/2023 2020)  Friction and Shear Interventions: Pad bony prominences, Off load heels, HOB 30 degrees or less unless contraindicated, Consider: TAP seated positioning, Heel foams

## 2023-02-10 NOTE — ED Provider Notes (Signed)
Chief Complaint:     Chief Complaint   Patient presents with    Abnormal Lab       History of Present Illness:   [name] [name] is a 77 y.o. male h/o ischemic cardiomyopathy, CAD, COPD, CHF, CLL, GERD, ICD who presents to the ED c/o Hgb 6.1 requesting transfusion.    History obtained from patient.   History obtained from another historian: n/a    Pt reports went to oncologist office and had abnormal labs   I spoke directly to Dr. Laurine Blazer who stated Hgb 6.1 - see MDM - needs transfusion    On Eliquis for CAD s/p stent     No fever, syncope, CP, SOB, dizziness, abd pain, n/v/d, bloody stools, or any other complaints     Reviewed nursing records: yes    Past Medical History:     Past Medical History:   Diagnosis Date    Acute CHF     NOV  & DEC 2018, - DIALYSIS CATH PLACED Aug 24 2017 TO REMOVE FLUID     Acute systolic (congestive) heart failure 08/2017    Anxiety     CAD (coronary artery disease)     Cardiomyopathy     nonischemic    CHF (congestive heart failure) 08/12/2014, 2013    Chronic obstructive pulmonary disease     POSSIBLE PER PT HE USES  SYNBICORT BID ABD  BREO INHALER PRN    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    Coronary artery disease 2011    CORONARY STENT PLACEMENT FOLLOWED BY [facility]    Depression     echocardiogram 02/2016, 11/2016, 09/2017, 12/20/2017    GERD (gastroesophageal reflux disease)     Heart attack 2011    and 2014    Hyperlipemia     Hypertension     ICD (implantable 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     PLACED 2014  [facility]  LAST INTERROGATION April 01 2018 REPORT REQUESTED    Ischemic cardiomyopathy     EF 15% ON ECHO 09-20-2017 DR GARG IN EPIC    Nonischemic cardiomyopathy     NSTEMI (non-ST elevated myocardial infarction)     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    Pacemaker 2014    MEDTRONIC ICD/PACEMAKER COMBO LAST INTERROGATION  12-12 2018    Pneumonia 09/2016    Primary cardiomyopathy     Ischemic cardiomyopathy EF 15% ON ECHO 09-20-2017 DR GARG IN EPIC    Sepsis 08/2017    Syncope  and collapse     PRIOR TO ICD/PACEMAKER PLACED 2014    Type 2 diabetes mellitus, controlled DX 1998    BS  AVG 100  A1C 7.4 Dec 27 2017    [place]     PT SEE HIS PMD April 01 2018 RE THIS-TOLD TO SEE PMD BY [facility] JUNE 12 WHEN THEY SAW HIM FOR A CL       Surgical History:     Past Surgical History:   Procedure Laterality Date    BIV  11/10/2016    ICD METRONIC  UPGRADE     CARDIAC CATHETERIZATION  03/2010    CORONARY STENT PLACEMENT X 2 PER PT; LM normal, LAD 70-80% distal lesion at apex, 20% lesion mid CFX    CARDIAC CATHETERIZATION  12/2012    CARDIAC DEFIBRILLATOR PLACEMENT  12/2012    [facility]    CARDIAC PACEMAKER PLACEMENT  2014    MEDTRONIC ICD/PACEMAKER PLACED [facility]    CIRCUMCISION  AGE 50  COLONOSCOPY, DIAGNOSTIC (SCREENING)  2009    CORRECTION HAMMER TOE      duodenal ulcer  1973    STRESS RELATED IN SCHOOL    ECHOCARDIOGRAM, TRANSTHORACIC  02/2016 11/2016 09/2017 12/2017    ECHOCARDIOGRAM, TRANSTHORACIC  02/2016,11/2016,12/20/2017    EGD N/A 08/15/2014    Procedure: EGD;  Surgeon: Colon Branch, MD;  Location: [facility];  Service: Gastroenterology;  Laterality: N/A;  egd w/ bx    EGD  1975    EGD, COLONOSCOPY N/A 04/04/2018    Procedure: EGD with bxs, COLONOSCOPY with polypectomy and clipping;  Surgeon: Doyne Keel, MD;  Location: [facility];  Service: Gastroenterology;  Laterality: N/A;    FRACTURE SURGERY  AGE 77     RT  ANKLE- CAST APPLIED    HERNIA REPAIR  AGE 46    LEFT INGUINAL HERNIA    MPI nuclear study  06/2016    orthopedic surgery  AGE 26    RT foot corrective - HAMMERTOE    RIGHT HEART CATH Right 09/09/2022    Procedure: Right Heart Cath;  Surgeon: Burna Forts, MD;  Location: [facility];  Service: Cardiovascular;  Laterality: Right;  tx from FO    RIGHT HEART CATH ONLY INCL. CO AND SATS Right 12/22/2022    Procedure: Right Heart Cath ONLY incl. CO and Sats;  Surgeon: Day, Bertram Millard, MD;  Location: [facility];  Service: Cardiovascular;  Laterality:  Right;  Right Heart Cath with leave in PA cath for milrinone initiation    TONSILLECTOMY AND ADENOIDECTOMY  1954    TUNNELED CATH PLACEMENT ([facility]ATH) N/A 12/25/2022    Procedure: [facility];  Surgeon: Tamala Bari, MD;  Location: [facility];  Service: Interventional Radiology;  Laterality: N/A;    TUNNELED CATH REMOVAL ([facility]ATH) Right 01/04/2023    Procedure: TUNNELED CATH REMOVAL;  Surgeon: Rex Kras, MD;  Location: [facility];  Service: Interventional Radiology;  Laterality: Right;       Family History:     Family History   Problem Relation Age of Onset    Breast cancer Mother     Heart attack Mother 31    Hypertension Mother     Diabetes Mother     Coronary artery disease Mother     Diabetes Brother     Heart attack Father     Hypertension Father        Social History:     Social History     Tobacco Use    Smoking status: Never    Smokeless tobacco: Never   Vaping Use    Vaping status: Never Used   Substance Use Topics    Alcohol use: Not Currently    Drug use: No       Allergies:     Allergies   Allergen Reactions    Allopurinol        Outpatient Medications:     Current Outpatient Medications   Medication Instructions    acalabrutinib (Calquence) 100 MG capsule 100 capsules, Oral, 2 times daily    amiodarone (PACERONE) 200 mg, Oral, Daily    apixaban (ELIQUIS) 5 mg, Oral, Every 12 hours, For 1 week followed by 1 tablet twice daily    atorvastatin (LIPITOR) 40 mg, Oral, At bedtime    Continuous Blood Gluc Sensor (FreeStyle Libre 3 Sensor) Misc 1 Device, Does not apply, Every 14 days    hydrALAZINE (APRESOLINE) 100 mg, Oral, Every 8 hours scheduled  insulin glargine (LANTUS) 33 Units, Subcutaneous, Every 12 hours    insulin lispro (1 Unit Dial) (HUMALOG KWIKPEN) 10 Units, Subcutaneous, 3 times daily before meals    Insulin Pen Needle (B-D UF III MINI PEN NEEDLES) 31G X 5 MM Misc Use as directed daily    isosorbide dinitrate (ISORDIL) 40 mg, Oral, 3 times daily with nitrate free interval    metoprolol  succinate XL (TOPROL-XL) 25 mg, Oral, Daily    niacin (NIASPAN) 500 MG CR tablet TAKE 1 TABLET BY MOUTH TWICE DAILY    Torsemide 80 mg, Oral, 2 times daily        Review of Systems:    Pertinent positive and negative ROS noted as per HPI.     Physical Exam:     When I was within 6 feet of this patient I donned the following PPE:  Surgical Mask: YES, Gloves: YES, Gown: NO; Goggles: NO; Face Shield: NO, 49M 6000 Respirator: NO; N95: NO.  The patient was wearing a mask during my evaluation: YES.    BP: 94/52, Heart Rate: 78, Temp: 97 F (36.1 C), Resp Rate: 18, SpO2: 100 %, Weight: 84.4 kg)    Physical Exam  Vitals and nursing note reviewed.   Constitutional:       General: He is not in acute distress.     Appearance: Normal appearance.   HENT:      Head: Normocephalic and atraumatic.      Right Ear: External ear normal.      Left Ear: External ear normal.      Nose: Nose normal.      Mouth/Throat:      Mouth: Mucous membranes are moist.   Eyes:      Extraocular Movements: Extraocular movements intact.      Conjunctiva/sclera: Conjunctivae normal.      Pupils: Pupils are equal, round, and reactive to light.   Cardiovascular:      Rate and Rhythm: Normal rate and regular rhythm.   Pulmonary:      Effort: Pulmonary effort is normal. No respiratory distress.      Breath sounds: Normal breath sounds.   Abdominal:      Palpations: Abdomen is soft.      Tenderness: There is no abdominal tenderness. There is no guarding or rebound.   Musculoskeletal:         General: No tenderness. Normal range of motion.      Cervical back: Normal range of motion and neck supple.   Skin:     General: Skin is warm and dry.   Neurological:      General: No focal deficit present.      Mental Status: He is alert and oriented to person, place, and time.   Psychiatric:         Mood and Affect: Mood normal.         Behavior: Behavior normal.         Labs:     Results       Procedure Component Value Units Date/Time    Hemoglobin and hematocrit,  blood [[id number]]  (Abnormal) Collected: 02/10/23 1946    Specimen: Blood Updated: 02/10/23 2022     Hgb 6.9 g/dL      Hematocrit 95.6 %     Narrative:      Obtain post transfusion  Res[place]uled by [id number] at 02/10/2023 15:04 Reason: blood Transfusion going    NT-proBNP [[id number]] Collected: 02/10/23 1946     Updated: 02/10/23 2018  High Sensitivity Troponin-I at 2 hrs with calculated Delta [[id number]] Collected: 02/10/23 1946    Specimen: Blood Updated: 02/10/23 2012    Narrative:      Rescheduled by [id number] at 02/10/2023 15:04 Reason: blood Transfusion going  Rescheduled by [id number] at 02/10/2023 15:04 Reason: blood Transfusion going    Glucose Whole Blood - POCT [[id number]]  (Abnormal) Collected: 02/10/23 2000     Updated: 02/10/23 2004     Whole Blood Glucose POCT 246 mg/dL     Glucose Whole Blood - POCT [[id number]]  (Abnormal) Collected: 02/10/23 1726     Updated: 02/10/23 1729     Whole Blood Glucose POCT 249 mg/dL     NT-proBNP [[id number]]  (Abnormal) Collected: 02/10/23 1131     Updated: 02/10/23 1418     NT-proBNP 5,990 pg/mL     Prepare / Crossmatch Red Blood Cells:  One Unit, 1 Units [[id number]] Collected: 02/10/23 1215     Updated: 02/10/23 1350     RBC Leukoreduced RBC Leukoreduced     BLUNIT [id number]     Status issued     PRODUCT CODE (NON READABLE) E0336V00     Expiration Date [id number]     UTYPE O POS     ISBT CODE 5100    ABO/Rh [[id number]] Collected: 02/10/23 1215    Specimen: Blood Updated: 02/10/23 1333     ABO Rh O POS    Antibody Screen [[id number]] Collected: 02/10/23 1215     Updated: 02/10/23 1333     AB Screen Gel NEG    Urinalysis Reflex to Microscopic Exam- Reflex to Culture [[id number]]  (Abnormal) Collected: 02/10/23 1234    Specimen: Urine, Clean Catch Updated: 02/10/23 1255     Urine Type Urine, Clean Ca     Color, UA Colorless     Clarity, UA Clear     Specific Gravity UA 1.011     Urine pH 6.0     Leukocyte Esterase, UA Negative     Nitrite, UA Negative     Protein, UR Negative      Glucose, UA 300= 3+     Ketones UA Negative     Urobilinogen, UA Normal mg/dL      Bilirubin, UA Negative     Blood, UA Negative    High Sensitivity Troponin-I [[id number]]  (Abnormal) Collected: 02/10/23 1131    Specimen: Blood Updated: 02/10/23 1223     hs Troponin-I 43.5 ng/L     Comprehensive metabolic panel [[id number]]  (Abnormal) Collected: 02/10/23 1131    Specimen: Blood Updated: 02/10/23 1215     Glucose 360 mg/dL      BUN 76.1 mg/dL      Creatinine 2.6 mg/dL      Sodium 607 mEq/L      Potassium 4.2 mEq/L      Chloride 102 mEq/L      CO2 22 mEq/L      Calcium 8.9 mg/dL      Protein, Total 6.1 g/dL      Albumin 3.3 g/dL      AST (SGOT) 34 U/L      ALT 26 U/L      Alkaline Phosphatase 86 U/L      Bilirubin, Total 0.7 mg/dL      Globulin 2.8 g/dL      Albumin/Globulin Ratio 1.2     Anion Gap 12.0     eGFR 24.8 mL/min/1.73 m2     Cell MorpHology [[id number]]  (Abnormal) Collected: 02/10/23 1131  Updated: 02/10/23 1215     Cell Morphology Abnormal     Platelet Estimate Decreased     Macrocytic 2+     Polychromasia Present     Ovalocytes Present    CBC and differential [[id number]]  (Abnormal) Collected: 02/10/23 1131    Specimen: Blood Updated: 02/10/23 1214     WBC 8.80 x10 3/uL      Hgb 6.3 g/dL      Hematocrit 40.9 %      Platelets 130 x10 3/uL      RBC 1.80 x10 6/uL      MCV 115.0 fL      MCH 35.0 pg      MCHC 30.4 g/dL      RDW 17 %      MPV 12.5 fL      Instrument Absolute Neutrophil Count 4.41 x10 3/uL      Neutrophils 50.2 %      Lymphocytes Automated 45.9 %      Monocytes 2.8 %      Eosinophils Automated 0.7 %      Basophils Automated 0.1 %      Immature Granulocytes 0.3 %      Nucleated RBC 0.8 /100 WBC      Neutrophils Absolute 4.41 x10 3/uL      Lymphocytes Absolute Automated 4.04 x10 3/uL      Monocytes Absolute Automated 0.25 x10 3/uL      Eosinophils Absolute Automated 0.06 x10 3/uL      Basophils Absolute Automated 0.01 x10 3/uL      Immature Granulocytes Absolute 0.03 x10 3/uL      Absolute NRBC  0.07 x10 3/uL     PT/APTT [[id number]]  (Abnormal) Collected: 02/10/23 1131     Updated: 02/10/23 1212     PT 22.1 sec      PT INR 1.9     PTT 33 sec              Radiology:     No orders to display       Medications administered in the ED:     ED Medication Orders (From admission, onward)      Start Ordered     Status Ordering Provider    02/11/23 0900 02/10/23 1311    Daily        Route: Intravenous  Ordered Dose: 40 mg       Discontinued MISSIMER, EMILY L    02/10/23 1800 02/10/23 1422    Every 12 hours        Route: Subcutaneous  Ordered Dose: 15 Units       Discontinued MISSIMER, EMILY L    02/10/23 1600 02/10/23 1422  insulin lispro injection 1-5 Units  Every 4 hours scheduled        Route: Subcutaneous  Ordered Dose: 1-5 Units      Placed in "And" Linked Group    Last MAR action: Given MISSIMER, EMILY L    02/10/23 1447 02/10/23 1447  furosemide (LASIX) injection 80 mg  2 times daily at 0800 and 1400        Route: Intravenous  Ordered Dose: 80 mg       Last MAR action: Given MUNTZER, LAURA A    02/10/23 1420 02/10/23 1422  dextrose (GLUCOSE) 40 % oral gel 15 g of glucose  As needed        Route: Oral  Ordered Dose: 15 g of glucose      Placed in "  Or" Linked Group    Acknowledged MISSIMER, EMILY L    02/10/23 1420 02/10/23 1422  dextrose (D10W) 10% bolus 125 mL  As needed        Route: Intravenous  Ordered Dose: 12.5 g      Placed in "Or" Linked Group    Acknowledged MISSIMER, EMILY L    02/10/23 1420 02/10/23 1422  dextrose 50 % bolus 12.5 g  As needed        Route: Intravenous  Ordered Dose: 12.5 g      Placed in "Or" Linked Group    Acknowledged MISSIMER, EMILY L    02/10/23 1420 02/10/23 1422  glucagon (rDNA) (GLUCAGEN) injection 1 mg  As needed        Route: Intramuscular  Ordered Dose: 1 mg      Placed in "Or" Linked Group    Acknowledged MISSIMER, EMILY L    02/10/23 1309 02/10/23 1311  ondansetron (ZOFRAN-ODT) disintegrating tablet 4 mg  Every 6 hours PRN        Route: Oral  Ordered Dose: 4 mg       Placed in "Or" Linked Group    Acknowledged MISSIMER, EMILY L    02/10/23 1309 02/10/23 1311  ondansetron (ZOFRAN) injection 4 mg  Every 6 hours PRN        Route: Intravenous  Ordered Dose: 4 mg      Placed in "Or" Linked Group    Acknowledged MISSIMER, EMILY L    02/10/23 1309 02/10/23 1311  acetaminophen (TYLENOL) tablet 650 mg  3 times daily PRN        Route: Oral  Ordered Dose: 650 mg       Acknowledged MISSIMER, EMILY L    02/10/23 1306 02/10/23 1311  saline (OCEAN NASAL SPRAY) 0.65 % nasal solution 2 spray  Every 4 hours PRN        Route: Each Nare  Ordered Dose: 2 spray       Acknowledged MISSIMER, EMILY L    02/10/23 1306 02/10/23 1311  carboxymethylcellulose (REFRESH TEARS) 0.5 % ophthalmic solution 1 drop  3 times daily PRN        Route: Both Eyes  Ordered Dose: 1 drop       Acknowledged MISSIMER, EMILY L    02/10/23 1306 02/10/23 1311  benzocaine-menthol (CEPACOL/CHLORASEPTIC) lozenge 1 lozenge  Every 2 hours PRN        Route: Buccal  Ordered Dose: 1 lozenge       Acknowledged MISSIMER, EMILY L    02/10/23 1306 02/10/23 1311  benzonatate (TESSALON) capsule 100 mg  3 times daily PRN        Route: Oral  Ordered Dose: 100 mg       Acknowledged MISSIMER, EMILY L    02/10/23 1306 02/10/23 1311  naloxone (NARCAN) injection 0.2 mg  As needed        Route: Intravenous  Ordered Dose: 0.2 mg       Acknowledged MISSIMER, EMILY L    02/10/23 1210 02/10/23 1209  pantoprazole (PROTONIX) injection 40 mg  Once        Route: Intravenous  Ordered Dose: 40 mg       Last MAR action: Given Lenoria Farrier D            MDM and ED Course:   77 y.o. male seen in the ED for Hgb 6.1 in the setting of Eliquis use.  Upon arrival to the ED, VS were notable for  BP 94/52, o/w wnl. Physical exam was notable as above.     Broad DDX considered on arrival including life-threatening and non-life threatening etiologies, such as anemia 2/2 CLL, acute blood loss, GI bleed such as PUD, gastritis, esophagitis, MW syndrome, AVM, diverticulosis,  anorectal disease, colon cancer, colitis, IBD.     ED course:   I have independently visualized and interpreted the EKG which showed AV dual paced rhythm, rate of 70, normal axis, QTc 501, T wave inversion in the lateral precordial leads in leads I and aVL, does not meet STEMI criteria, this is similar to prior EKG from 12/21/2022.    I have independently visualized and interpreted the labs which were notable for H&H 6.3/20.7 compared to 10.5 from 1 month ago and similar to office values reported by oncology.  Creatinine 2.6 similar to baseline of 2.8 from 3 weeks ago, glucose elevated at 360 with no evidence of DKA, BNP elevated at 5990, high-sensitivity troponin 43 with repeat ordered and pending..     ED Course as of 02/10/23 2048   Wed Feb 10, 2023   1116 D/w Dr. Laurine Blazer oncology - had Hgb 6.1 in the office, WBC 8, Plt 119 - hx CHF, on Eliquis - recommended transfusion and admit 23 hour obs for workup  [TF]   1209 Rectal performed with RN chaperone present - Hemoccult positive black stool - will give protonix, will message GI  [TF]   1215 D/w GI - will see patient [TF]      ED Course User Index  [TF] Bonney Aid, MD     It was felt that this pt would benefit from admission to the hospital.   D/w hospitalist who accepted this pt for admission.   From this point forward I defer all further workup and management to the hospital team.   This patient remained stable throughout their ED stay while under my care.     CRITICAL CARE ATTESTATION:  I provided 45 minutes of non-continuous critical care time, excluding separately billable procedures, evaluating this patient's critical illness and devoting time to direct management, repeat assessments, review of test results, discussing their care with consultants, and documenting in the record. This patient's GI and hematologic and cardiac vital organ system was acutely impaired, there was a high probability of imminent and life-threatening deterioration of the patient's  condition, and I provided multiple interventions to prevent further deterioration of the patient's condition (acute GB bleed with blood loss anemia with Hgb <7 on blood thinners requiring transfusion with elevated troponin and BNP, requiring admission).     Diagnosis:     Final diagnoses:   GI bleed   Acute blood loss anemia   Anticoagulated by anticoagulation treatment   Personal history of CLL (chronic lymphocytic leukemia)   History of CHF (congestive heart failure)   Elevated troponin   Elevated brain natriuretic peptide (BNP) level   Chronic kidney disease, unspecified CKD stage       Disposition:     ED Disposition       ED Disposition   Admit    Condition   --    Date/Time   Wed Feb 10, 2023  1:11 PM    Comment   Admitting Physician: Abram Sander [[id number]]   Service:: Medicine [[id number]]   Estimated Length of Stay: > or = to 2 midnights   Tentative Discharge Plan?: Home or Self Care [1]  Aurelio Brash, MD      *This note was generated by the Epic EMR system/ Dragon speech recognition and may contain inherent errors or omissions not intended by the user.   Grammatical errors, random word insertions, deletions, pronoun errors and incomplete sentences are occasional consequences   of this technology due to software limitations. Not all errors are caught or corrected.      My documentation is often completed after the patient is no longer under my clinical care.   In some cases, the Epic EMR may pull updated results into the above documentation which may not reflect all results or   information that was available to me at the time of my medical decision making.      If there are questions or concerns about the content of this note or information contained within the body of this dictation   they should be addressed directly with the author for clarification.

## 2023-02-10 NOTE — ED to IP RN Note (Signed)
[facility] [place]  ED NURSING NOTE FOR THE RECEIVING INPATIENT NURSE   ED NURSE Elana Alm [phone]   ED CHARGE RN Lauren   ADMISSION INFORMATION   [name] [name] is a 77 y.o. male admitted with an ED diagnosis of:    1. GI bleed         Isolation: None   Allergies: Allopurinol   Holding Orders confirmed? Yes   Belongings Documented? N/A   Home medications sent to pharmacy confirmed? N/A   NURSING CARE   Patient Comes From:   Mental Status: Home Independent  alert and oriented   ADL: Needs assistance with ADLs   Ambulation: 1 person assist   Pertinent Information  and Safety Concerns:     Broset Violence Risk Level: Low Call RN with questions.      CT / NIH   CT Head ordered on this patient?  N/A   NIH/Dysphagia assessment done prior to admission? N/A   VITAL SIGNS (at the time of this note)      Vitals:    02/10/23 1243   BP: 102/66   Pulse: 71   Resp: (!) 24   Temp:    SpO2: 99%     Pain Score: 0-No pain (02/10/23 1113)

## 2023-02-10 NOTE — Progress Notes (Signed)
02/10/23 1515   Vitals   Level of Consciousness Alert   Temp 97.4 F (36.3 C)   Temp Source Temporal   Heart Rate 71   Pulse (SpO2) 70   Resp Rate 17   BP 109/70   BP Location Left arm   BP Method Automatic   MAP (mmHg) 81   Patient Position Lying   Cardiac Rhythm Ventricular paced   Ectopy Premature ventricular contractions - multiform   Ectopy Frequency Occasional   Oxygen Therapy   SpO2 99 %   O2 Device None (Room air)     Pt was transferred from [facility] to [facility] via hospital transfer services.  Pt vitals were taken as listed above.  Patient has 1 unit blood running at 75 ml/hr dual signed off with ED RN.   Pt oriented to unit & admission process. Pt verbalized understanding.   Pt left in bed at lowest position with callbell within reach.

## 2023-02-10 NOTE — H&P (Addendum)
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 Laurine Blazer, who was seen in Dr. Lynford Citizen clinic this morning and was recommended to come to the emergency room for acute anemia with hemoglobin in the low 6 range.     # Acute macrocytic anemia secondary to acute GI bleed  -- h/o IDA  - INR: 1.9, PT: 22 (02/10/23)  - hemoglobin: 6.3 (02/10/23) down from 10.5 on 01/04/2023  - platelets: 130 (02/10/23)  - mean corpuscular volume: 115 (02/10/23)  - vitamin B12: 2000, folate: 12.6 (12/24/22)  - est. baseline hemoglobin: 10.9 g/dL  - Occult stool positive; with dark black/green stool   Plan  - currently on: pantoprazole 40 mg iv daily  - holding home: aspirin and Eliquis  - type and screen and blood consent obtained in the emergency room  - patient recieving 1 unit PRBC in ER  - will need diuresis after given hisotry of CHF/cardiomyopathy: [facility]ology to make recommendations   - continue to trend H&H  - hematology consult (Dr. Laurine Blazer sent patient to the hospital)  - continue to transfuse to keep hemoglobin > 7  - Gastroentology consult for GI bleed -- Dr. Jens Som has agreed to consultation  - Keep NPO  - IV Protonix     # Troponin elevation   -Patient does not have any cardiac symptoms at this time  - ECG: "AV dual-paced rhythm ABNORMAL ECG WHEN COMPARED WITH ECG OF 21-Dec-2022 23:21, VENTRICULAR RATE HAS DECREASED by 17 bpm Narrative: AV dual-paced rhythm ABNORMAL ECG WHEN COMPARED WITH ECG OF 21-Dec-2022 23:21, VENTRICULAR RATE HAS DECREASED by 17 bpm" (02/10/23)  - troponin I high sensitivity: 44  (02/10/23 11:31)  Plan:  - continue to trend troponin  - monitor on telemetry  - cardiac meds as ordered  - [facility] consultation pending         Cardiac:  # Congestive heart failure: chronic, systolic and diastolic  Non-ischemic Cardiomyopathy, 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 baseline 84 kg  -- pro BNP 5990  - prior admission 01/04/23 required bumex gtt and IV diuril; also was on a dobutamine gtt  -GDMT limited by renal dysfunction (no ACEI/ARB/MRA/SGLT2i), no beta blocker 2/2 cardiogenic shock; continue hydralazine and isordil  - not on O2  Plan  - -Continue home: isosorbide and metoprolol  -Patient is on torsemide 80 mg p.o. twice daily, took a.m. dose; as above [place] [facility] to make recommendations regarding continued diuresis recommendations after blood products  - continue fluid restriction and low salt diet  -Strict I's and O's    # Chronic hypertension with noted hypotension in the emergency room  - 24hr BP range: (94-109) / (52-69) (02/10/23)  -Patient has taken his home medications including Imdur, hydralazine and metoprolol today  -Will hold afternoon dose of hydralazine and see how pressures are after he received his  blood transfusion  -Rest of his home medications are ordered with hold parameters  -[facility]ology to offer input regarding medication regimen     # Hyperlipidemia: with clinical ASCVD  - lipid panel: total cholesterol: 106 / calculated LDL: 63 / HDL: 28 / triglycerides: 76 / calculated VLDL: 15 (12/21/22)  - home: atorvastatin and niacin continued    # Coronary artery disease  -Aspirin on hold given GI bleed  -Continuing home: Imdur and beta-blocker     # history of Narrow complex supraventricular tachycardia, s/p  unsuccessful cardioversion 12/21/22  - continue amiodarone 200mg  po daily (taken this AM)  - tele monitoring     Hematology/oncology:  # Chronic lymphocytic leukemia, stage IV  diagnosed 06/2020  -started acalabrutinib (Calquence) in July 2023  -Seen by oncology ([facility]) last hospitalization and his Calquence has been on hold --we will continue to hold this medication as it does have an anticoagulant effect  -Follow-up recommendations per Dr. 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 Renaee Munda, who has not been consulted at this time.  Defer to medical attending to determine consultation depending on creatinine    Endo:  # Diabetes Mellitus, Type 2: uncontrolled  - glucose: 360 (02/10/23)  - hemoglobin A1c: 7.7 % (12/21/22) up from 5.9 % (08/06/22)  - home regimen: - insulin glargine 100 UNIT/ML injection; Inject 33 Units into the skin every 12 (twelve) hours and Lantus 10 units 3 times daily  -For now the patient is n.p.o., we will restart long-acting at 50% and low insulin immediate acting sliding scale every 4 hours and continue adjust based off of blood glucose trends     Psych: No active issues  # Depression and Anxiety: recurrent  - not on medications    Respiratory: No active issues  # COPD, no acute exacerbation; reports Breo PRN at home (no active home med for this)    --------------------------------------------------------------------------------------------------------------------    # Goals of care  -Multiple hospital admissions for acute decompensated heart failure in 2023  -Palliative care has been consulted, per prior documentation hospice appropriate  -Palliative consult wil  see the patient on 4/25/2024l        Reviewed Medications from Dispense report: [x] YES / [] NO   Confirmed Medications with patient/family:     [x] YES / [] NO     Patient has BMI=Body mass index is 25.24 kg/m.  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: Anemia of Chronic Disease, Anemia of Chronic Kidney Disease, Anemia of Neoplastic Disease, and Iron Deficiency Anemia  DVT Prophylaxis:Medication VTE Prophylaxis Orders: Reason for no VTE prophylaxis meds - Recent GI bleed (within last 3 months)  Mechanical VTE Prophylaxis Orders: Maintain sequential compression device   Central Line/Foley Catheter/PICC line status: None  Code Status: Full Code  Disposition: To be determined  Type of Admission:Inpatient   Estimated Length of Stay (including stay in the ER receiving treatment): Greater than 2 midnights  Milestones required for discharge: Stable hemoglobin, GI evaluation, hematology consultation, cardiac evaluation, this is a complex patient requiring multiple medical team inputs  Clinical Information and History:   Chief Complaint:  Chief Complaint   Patient presents with    Abnormal Lab           Past Medical History:  Past Medical History:   Diagnosis Date    Acute CHF     NOV  & DEC 2018, - DIALYSIS CATH PLACED Aug 24 2017 TO REMOVE FLUID     Acute systolic (congestive) heart failure 08/2017    Anxiety     CAD (coronary artery disease)     Cardiomyopathy     nonischemic    CHF  (congestive heart failure) 08/12/2014, 2013    Chronic obstructive pulmonary disease     POSSIBLE PER PT HE USES  SYNBICORT BID ABD  BREO INHALER PRN    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    Coronary artery disease 2011    CORONARY STENT PLACEMENT FOLLOWED BY [facility]    Depression     echocardiogram 02/2016, 11/2016, 09/2017, 12/20/2017    GERD (gastroesophageal reflux disease)     Heart attack 2011    and 2014    Hyperlipemia     Hypertension     ICD (implantable 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     PLACED 2014  [facility]  LAST INTERROGATION April 01 2018 REPORT REQUESTED    Ischemic cardiomyopathy     EF 15% ON ECHO 09-20-2017 DR GARG IN EPIC    Nonischemic cardiomyopathy     NSTEMI (non-ST elevated myocardial infarction)     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    Pacemaker 2014    MEDTRONIC ICD/PACEMAKER COMBO LAST INTERROGATION  12-12 2018    Pneumonia 09/2016    Primary cardiomyopathy     Ischemic cardiomyopathy EF 15% ON ECHO 09-20-2017 DR GARG IN EPIC    Sepsis 08/2017    Syncope and collapse     PRIOR TO ICD/PACEMAKER PLACED 2014    Type 2 diabetes mellitus, controlled DX 1998    BS  AVG 100  A1C 7.4 Dec 27 2017    [facility]     PT SEE HIS PMD April 01 2018 RE THIS-TOLD TO SEE PMD BY [facility] JUNE 12 WHEN THEY SAW HIM FOR A CL     Past Surgical History:  Past Surgical History:   Procedure Laterality Date    BIV  11/10/2016    ICD METRONIC  UPGRADE     CARDIAC CATHETERIZATION  03/2010    CORONARY STENT PLACEMENT X 2 PER PT; LM  normal, LAD 70-80% distal lesion at apex, 20% lesion mid CFX    CARDIAC CATHETERIZATION  12/2012    CARDIAC DEFIBRILLATOR PLACEMENT  12/2012    [facility]    CARDIAC PACEMAKER PLACEMENT  2014    MEDTRONIC ICD/PACEMAKER PLACED [facility]    CIRCUMCISION  AGE 21    COLONOSCOPY, DIAGNOSTIC (SCREENING)  2009    CORRECTION HAMMER TOE      duodenal ulcer  1973    STRESS RELATED IN SCHOOL    ECHOCARDIOGRAM, TRANSTHORACIC  02/2016 11/2016 09/2017 12/2017    ECHOCARDIOGRAM, TRANSTHORACIC   02/2016,11/2016,12/20/2017    EGD N/A 08/15/2014    Procedure: EGD;  Surgeon: Colon Branch, MD;  Location: [facility];  Service: Gastroenterology;  Laterality: N/A;  egd w/ bx    EGD  1975    EGD, COLONOSCOPY N/A 04/04/2018    Procedure: EGD with bxs, COLONOSCOPY with polypectomy and clipping;  Surgeon: Doyne Keel, MD;  Location: [facility];  Service: Gastroenterology;  Laterality: N/A;    FRACTURE SURGERY  AGE 78     RT  ANKLE- CAST APPLIED    HERNIA REPAIR  AGE 21    LEFT INGUINAL HERNIA    MPI nuclear study  06/2016    orthopedic surgery  AGE 81    RT foot corrective - HAMMERTOE    RIGHT HEART CATH Right 09/09/2022    Procedure: Right Heart Cath;  Surgeon: Burna Forts, MD;  [facility];  Service: Cardiovascular;  Laterality: Right;  tx from FO    RIGHT HEART CATH ONLY INCL. CO AND SATS Right 12/22/2022    Procedure: Right Heart Cath ONLY incl. CO and Sats;  Surgeon: Day, Bertram Millard, MD;  Location[facility];  Service: Cardiovascular;  Laterality: Right;  Right Heart Cath with leave in PA cath for milrinone initiation    TONSILLECTOMY AND ADENOIDECTOMY  1954    TUNNELED CATH PLACEMENT (PERMCATH) N/A 12/25/2022    Procedure: [facility];  Surgeon: Tamala Bari, MD;  [facility];  Service: Interventional Radiology;  Laterality: N/A;    TUNNELED CATH REMOVAL (PERMCATH) Right 01/04/2023    Procedure: TUNNELED CATH REMOVAL;  Surgeon: Rex Kras, MD;  [facility];  Service: Interventional Radiology;  Laterality: Right;     Family History:  Family History   Problem Relation Age of Onset    Breast cancer Mother     Heart attack Mother 42    Hypertension Mother     Diabetes Mother     Coronary artery disease Mother     Diabetes Brother     Heart attack Father     Hypertension Father     Reviewed  Social History:  Social History     Substance and Sexual Activity   Alcohol Use Not Currently     Social History     Substance and Sexual Activity   Drug  Use No     Social History     Tobacco Use   Smoking Status Never   Smokeless Tobacco Never     Social History     Socioeconomic History    Marital status: Married     Spouse name: Octavia    Number of children: 0    Years of education: None    Highest education level: None   Occupational History    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, history    Tobacco Use    Smoking status: Never  Smokeless tobacco: Never   Vaping Use    Vaping status: Never Used   Substance and Sexual Activity    Alcohol use: Not Currently    Drug use: No    Sexual activity: Not Currently     Partners: Female   Other Topics Concern    None   Social History Narrative    None     Social Determinants of Health     Financial Resource Strain: Low Risk  (01/17/2023)    Overall Financial Resource Strain (CARDIA)     Difficulty of Paying Living Expenses: Not hard at all   Food Insecurity: No Food Insecurity (02/10/2023)    Hunger Vital Sign     Worried About Running Out of Food in the Last Year: Never true     Ran Out of Food in the Last Year: Never true   Transportation Needs: No Transportation Needs (01/17/2023)    PRAPARE - Therapist, art (Medical): No     Lack of Transportation (Non-Medical): No   Physical Activity: Insufficiently Active (01/17/2023)    Exercise Vital Sign     Days of Exercise per Week: 1 day     Minutes of Exercise per Session: 10 min   Stress: Stress Concern Present (01/17/2023)    [employer] of Occupational Health - Occupational Stress Questionnaire     Feeling of Stress : To some extent   Social Connections: Moderately Integrated (01/17/2023)    Social Connection and Isolation Panel [NHANES]     Frequency of Communication with Friends and Family: More than three times a week     Frequency of Social Gatherings with Friends and Family: Once a week     Attends Religious Services: More than 4 times per year     Active Member of [employer] or Organizations: No     Attends Banker Meetings: Never      Marital Status: Married   Catering manager Violence: Not At Risk (02/10/2023)    Humiliation, Afraid, Rape, and Kick questionnaire     Fear of Current or Ex-Partner: No     Emotionally Abused: No     Physically Abused: No     Sexually Abused: No   Housing Stability: Low Risk  (01/17/2023)    Housing Stability Vital Sign     Unable to Pay for Housing in the Last Year: No     Number of Places Lived in the Last Year: 1     Unstable Housing in the Last Year: No     Allergies:  Allergies   Allergen Reactions    Allopurinol      Medications:(Not in a hospital admission)    Clinical Presentation: HPI:   Mance Vallejo is a 77 y.o. male who has history of   Past Surgical History:   Procedure Laterality Date    BIV  11/10/2016    ICD METRONIC  UPGRADE     CARDIAC CATHETERIZATION  03/2010    CORONARY STENT PLACEMENT X 2 PER PT; LM normal, LAD 70-80% distal lesion at apex, 20% lesion mid CFX    CARDIAC CATHETERIZATION  12/2012    CARDIAC DEFIBRILLATOR PLACEMENT  12/2012    [facility]    CARDIAC PACEMAKER PLACEMENT  2014    MEDTRONIC ICD/PACEMAKER PLACED [facility]    CIRCUMCISION  AGE 33    COLONOSCOPY, DIAGNOSTIC (SCREENING)  2009    CORRECTION HAMMER TOE      duodenal ulcer  1973    STRESS RELATED IN SCHOOL    ECHOCARDIOGRAM, TRANSTHORACIC  02/2016 11/2016 09/2017 12/2017    ECHOCARDIOGRAM, TRANSTHORACIC  02/2016,11/2016,12/20/2017    EGD N/A 08/15/2014    Procedure: EGD;  Surgeon: Colon Branch, MD;  [facility];  Service: Gastroenterology;  Laterality: N/A;  egd w/ bx    EGD  1975    EGD, COLONOSCOPY N/A 04/04/2018    Procedure: EGD with bxs, COLONOSCOPY with polypectomy and clipping;  Surgeon: Doyne Keel, MD;  Location: [facility];  Service: Gastroenterology;  Laterality: N/A;    FRACTURE SURGERY  AGE 89     RT  ANKLE- CAST APPLIED    HERNIA REPAIR  AGE 37    LEFT INGUINAL HERNIA    MPI nuclear study  06/2016    orthopedic surgery  AGE 26    RT foot corrective - HAMMERTOE    RIGHT HEART  CATH Right 09/09/2022    Procedure: Right Heart Cath;  Surgeon: Burna Forts, MD;  [facility];  Service: Cardiovascular;  Laterality: Right;  tx from FO    RIGHT HEART CATH ONLY INCL. CO AND SATS Right 12/22/2022    Procedure: Right Heart Cath ONLY incl. CO and Sats;  Surgeon: Day, Bertram Millard, MD;  [facility];  Service: Cardiovascular;  Laterality: Right;  Right Heart Cath with leave in PA cath for milrinone initiation    TONSILLECTOMY AND ADENOIDECTOMY  1954    TUNNELED CATH PLACEMENT ([facility]ATH) N/A 12/25/2022    Procedure: [facility];  Surgeon: Tamala Bari, MD;  [facility];  Service: Interventional Radiology;  Laterality: N/A;    TUNNELED CATH REMOVAL ([facility]ATH) Right 01/04/2023    Procedure: TUNNELED CATH REMOVAL;  Surgeon: Rex Kras, MD;  [facility];  Service: Interventional Radiology;  Laterality: Right;      Past Medical History:   Diagnosis Date    Acute CHF     NOV  & DEC 2018, - DIALYSIS CATH PLACED Aug 24 2017 TO REMOVE FLUID     Acute systolic (congestive) heart failure 08/2017    Anxiety     CAD (coronary artery disease)     Cardiomyopathy     nonischemic    CHF (congestive heart failure) 08/12/2014, 2013    Chronic obstructive pulmonary disease     POSSIBLE PER PT HE USES  SYNBICORT BID ABD  BREO INHALER PRN    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    Coronary artery disease 2011    CORONARY STENT PLACEMENT FOLLOWED BY [facility]    Depression     echocardiogram 02/2016, 11/2016, 09/2017, 12/20/2017    GERD (gastroesophageal reflux disease)     Heart attack 2011    and 2014    Hyperlipemia     Hypertension     ICD (implantable 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     PLACED 2014  [facility]  LAST INTERROGATION April 01 2018 REPORT REQUESTED    Ischemic cardiomyopathy     EF 15% ON ECHO 09-20-2017 DR GARG IN EPIC    Nonischemic cardiomyopathy     NSTEMI (non-ST elevated myocardial infarction)     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    Pacemaker 2014     MEDTRONIC ICD/PACEMAKER COMBO LAST INTERROGATION  12-12 2018    Pneumonia 09/2016    Primary cardiomyopathy     Ischemic cardiomyopathy EF 15% ON ECHO 09-20-2017 DR GARG IN EPIC    Sepsis 08/2017  Syncope and collapse     PRIOR TO ICD/PACEMAKER PLACED 2014    Type 2 diabetes mellitus, controlled DX 1998    BS  AVG 100  A1C 7.4 Dec 27 2017    [facility]     PT SEE HIS PMD April 01 2018 RE THIS-TOLD TO SEE PMD BY [facility] JUNE 12 WHEN THEY SAW HIM FOR A CL    came with   the chief complaint of Abnormal Lab    77 year old male with complex medical history including 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 Laurine Blazer, who was seen in Dr. Lynford Citizen clinic this morning and was recommended to come to the emergency room for acute anemia with hemoglobin in the low 6 range.    The patient states that he lives in [place] with his wife, who manages his his medications and helps with his medical care.  There are notes in the chart suggesting that patient may be entering into hospice, but he denies this and states that he is still with active medical care and has PT, OT and home health since his last hospitalization in March 2024.    He had routine follow-up today with his cardiologist and oncologist.  He did have blood drawn and was amenable to hospital admission for blood transfusion and workup. The patient denies any active chest pain, is at his baseline shortness of breath.  No headache, change in vision, difficulty with speech or focal weakness.  He states that he has been taking his medications as prescribed after last hospitalization.  He did take his Eliquis and aspirin this morning.  He has not restarted on his medication for CLL yet.  Review of Systems:   Chief Complaint:  Abnormal Lab    Review of Systems   Respiratory:  Positive for shortness of breath.      Physical  Exam:     Vitals:    02/10/23 1113 02/10/23 1203 02/10/23 1243   BP: 94/52 109/69 102/66   Pulse: 78 80 71   Resp: 18  (!) 24   Temp: 97 F (36.1 C)     TempSrc: Temporal     SpO2: 100% 100% 99%   Weight: 84.4 kg (186 lb 1.1 oz)     Height: 1.829 m (6')       Physical Exam:   Physical Exam  Vitals and nursing note reviewed.   Constitutional:       General: He is not in acute distress.     Appearance: Normal appearance. He is not toxic-appearing.   HENT:      Right Ear: External ear normal.      Left Ear: External ear normal.      Mouth/Throat:      Mouth: Mucous membranes are moist.   Eyes:      Extraocular Movements: Extraocular movements intact.   Cardiovascular:      Rate and Rhythm: Normal rate.      Pulses: Normal pulses.   Pulmonary:      Effort: Pulmonary effort is normal.      Breath sounds: Normal breath sounds.   Abdominal:      General: Bowel sounds are normal. There is no distension.      Palpations: Abdomen is soft.      Tenderness: There is no abdominal tenderness.   Musculoskeletal:      Comments:  Pitting ankle edema, at baseline per patient   Skin:     General: Skin is dry.      Capillary Refill: Capillary refill takes less than 2 seconds.   Neurological:      General: No focal deficit present.      Mental Status: He is alert and oriented to person, place, and time. Mental status is at baseline.   Psychiatric:         Mood and Affect: Mood normal.       Results of Labs/imaging:   Labs have been reviewed:   Coagulation Profile:   Recent Labs   Lab 02/10/23  1131   PT 22.1*   PT INR 1.9*   PTT 33       CBC review:   Recent Labs   Lab 02/10/23  1131   WBC 8.80   Hgb 6.3*   Hematocrit 20.7*   Platelets 130*   MCV 115.0*   RDW 17*   Neutrophils 50.2   Neutrophils Absolute 4.41   Lymphocytes Automated 45.9   Eosinophils Automated 0.7   Immature Granulocytes 0.3   Immature Granulocytes Absolute 0.03     Chem Review:  Recent Labs   Lab 02/10/23  1131   Sodium 136   Potassium 4.2   Chloride 102   CO2 22    BUN 62.0*   Creatinine 2.6*   Glucose 360*   Calcium 8.9   Bilirubin, Total 0.7   AST (SGOT) 34   ALT 26   Alkaline Phosphatase 86     Results       Procedure Component Value Units Date/Time    Prepare / Crossmatch Red Blood Cells:  One Unit, 1 Units [[id number]] Collected: 02/10/23 1215     Updated: 02/10/23 1337     RBC Leukoreduced RBC Leukoreduced     BLUNIT [id number]     Status selected     PRODUCT CODE (NON READABLE) [id number]     Expiration Date [id number]     UTYPE O POS     ISBT CODE 5100    ABO/Rh [[id number]] Collected: 02/10/23 1215    Specimen: Blood Updated: 02/10/23 1333     ABO Rh O POS    Antibody Screen [[id number]] Collected: 02/10/23 1215     Updated: 02/10/23 1333     AB Screen Gel NEG    Urinalysis Reflex to Microscopic Exam- Reflex to Culture [[id number]]  (Abnormal) Collected: 02/10/23 1234    Specimen: Urine, Clean Catch Updated: 02/10/23 1255     Urine Type Urine, Clean Ca     Color, UA Colorless     Clarity, UA Clear     Specific Gravity UA 1.011     Urine pH 6.0     Leukocyte Esterase, UA Negative     Nitrite, UA Negative     Protein, UR Negative     Glucose, UA 300= 3+     Ketones UA Negative     Urobilinogen, UA Normal mg/dL      Bilirubin, UA Negative     Blood, UA Negative    High Sensitivity Troponin-I [[id number]]  (Abnormal) Collected: 02/10/23 1131    Specimen: Blood Updated: 02/10/23 1223     hs Troponin-I 43.5 ng/L     Comprehensive metabolic panel [[id number]]  (Abnormal) Collected: 02/10/23 1131    Specimen: Blood Updated: 02/10/23 1215     Glucose 360 mg/dL      BUN 73.2 mg/dL      Creatinine 2.6 mg/dL  Sodium 136 mEq/L      Potassium 4.2 mEq/L      Chloride 102 mEq/L      CO2 22 mEq/L      Calcium 8.9 mg/dL      Protein, Total 6.1 g/dL      Albumin 3.3 g/dL      AST (SGOT) 34 U/L      ALT 26 U/L      Alkaline Phosphatase 86 U/L      Bilirubin, Total 0.7 mg/dL      Globulin 2.8 g/dL      Albumin/Globulin Ratio 1.2     Anion Gap 12.0     eGFR 24.8 mL/min/1.73 m2     Cell  MorpHology [[id number]]  (Abnormal) Collected: 02/10/23 1131     Updated: 02/10/23 1215     Cell Morphology Abnormal     Platelet Estimate Decreased     Macrocytic 2+     Polychromasia Present     Ovalocytes Present    CBC and differential [[id number]]  (Abnormal) Collected: 02/10/23 1131    Specimen: Blood Updated: 02/10/23 1214     WBC 8.80 x10 3/uL      Hgb 6.3 g/dL      Hematocrit 62.1 %      Platelets 130 x10 3/uL      RBC 1.80 x10 6/uL      MCV 115.0 fL      MCH 35.0 pg      MCHC 30.4 g/dL      RDW 17 %      MPV 12.5 fL      Instrument Absolute Neutrophil Count 4.41 x10 3/uL      Neutrophils 50.2 %      Lymphocytes Automated 45.9 %      Monocytes 2.8 %      Eosinophils Automated 0.7 %      Basophils Automated 0.1 %      Immature Granulocytes 0.3 %      Nucleated RBC 0.8 /100 WBC      Neutrophils Absolute 4.41 x10 3/uL      Lymphocytes Absolute Automated 4.04 x10 3/uL      Monocytes Absolute Automated 0.25 x10 3/uL      Eosinophils Absolute Automated 0.06 x10 3/uL      Basophils Absolute Automated 0.01 x10 3/uL      Immature Granulocytes Absolute 0.03 x10 3/uL      Absolute NRBC 0.07 x10 3/uL     PT/APTT [[id number]]  (Abnormal) Collected: 02/10/23 1131     Updated: 02/10/23 1212     PT 22.1 sec      PT INR 1.9     PTT 33 sec     NT-proBNP [[id number]] Collected: 02/10/23 1131     Updated: 02/10/23 1152          Radiology reports have been reviewed:  Radiology Results (24 Hour)       ** No results found for the last 24 hours. **          No results found.  Pathology:   Specimens (From admission, onward)      None          EKG: EKG reviewed   Last EKG Result       Procedure Component Value Units Date/Time    ECG 12 lead [[id number]] Collected: 02/10/23 1136     Updated: 02/10/23 1137     Ventricular Rate 70 BPM      Atrial Rate 70 BPM  P-R Interval 204 ms      QRS Duration 92 ms      Q-T Interval 464 ms      QTC Calculation (Bezet) 501 ms      P Axis -1 degrees      R Axis -14 degrees      T Axis 115 degrees       [facility] MUSE NARRATIVE AND IMPRESSION --     AV dual-paced rhythm  ABNORMAL ECG  WHEN COMPARED WITH ECG OF 21-Dec-2022 23:21,  VENTRICULAR RATE HAS DECREASED by  17 bpm      Narrative:      AV dual-paced rhythm  ABNORMAL ECG  WHEN COMPARED WITH ECG OF 21-Dec-2022 23:21,  VENTRICULAR RATE HAS DECREASED by  17 bpm            Hospitalist:   Signed by:   Arville Care Sylwia Cuervo, NP  02/10/2023 1:46 PM  Time spent in evaluating and managing the care of the patient including face-to-face and non-face-to-face: 75    *This note was generated by the Epic EMR system/ Dragon speech recognition and may contain inherent errors or omissions not intended by the user. Grammatical errors, random word insertions, deletions, pronoun errors and incomplete sentences are occasional consequences of this technology due to software limitations. Not all errors are caught or corrected. If there are questions or concerns about the content of this note or information contained within the body of this dictation they should be addressed directly with the author for clarification.

## 2023-02-10 NOTE — ED Triage Notes (Signed)
Patient came down from his oncologist office with the complaints of abnormal labs and needing a blood transfusion. Patient is unaware of what his labs are. Patients oncologist told him that he would like him to stay over night. No chest pain, weakness or sob.

## 2023-02-10 NOTE — Progress Notes (Signed)
RN Shift Note and Trio Rounds template    Date Time: 02/10/23 7:38 PM  Patient Name: [name] [name], [name] [name]  Room No: Z6109/U0454.A  Admit Date: 02/10/2023  Length of Stay: 0  Attending Physician: Abram Sander, DO   Code Status:Full Code  Admit Status: Inpatient    Shift Note:  Orientation:AOx4  Rhythm on Tele: V-Paced  Oxygen: RA  Ambulation: Independent at baseline;standby  Pain/VS: none  Lines/Drips/Foley: Blood Transfusion   GI: CLD  GU: External cath  Fall Score: Moderate  Psychosocial: WNL  Plan: Cardiology, oncology, GI, palliative care consult. Finished 1 RBC infusion, Bowel prep tonight, for tenative 1030 GI scope in the AM.       Trio Rounds Template: X what is applicable in right hand column.     02/10/23    Overnight Events/ Updates: (Call to Physician, RRT, new symptoms, Changes to exam per RN)         Assessment:     Vital Signs: (abnormal and pain is the 5th vital sign)    Weight: (Trending, I&O's, Net, diuresing)                                     Cardiac: (Rhythm events/ changes, Short-term tele) V-paced   Respiratory: (increase or decreased O2 demands, O2 needed at home)    GI/GU: (urine or bowel concerns, dietary concerns, fluid or food intake concerns, NPO)      Bowel prep for GI scope, NPO after AM meds at 0900         Patient has abnormal labs (K, Mg, hb, Cr, LFT's, Cultures resulted in last 24 hours)  H/H: 6.3/20.7      Patient had Imaging resulted in 24 hours   No results found.        Activity Safety:     PT/OT, sitters                Patient/family have questions on plan of care                                                      Concerns about Mental Status and ADL's                                                           Psychosocial:           Patient/family have questions on plan of care/ discharge plans     Medication history updated        Unit Protocols:     Patient has Foley Catheter                                          Patient has [address]  risk                                                                     Moderate   Patient has Pressure Ulcer or at Risk                         Glucose less than 100 and on insulin                           Readmission in last 30 days      Patient transferred last night from another unit                                                 Penelope Galas, RN   7:38 PM   02/10/23

## 2023-02-10 NOTE — EDIE (Signed)
[facility]?NOTIFICATION?02/10/2023 11:11?[name] [name], [name] [name]?MRN: [id number]    [name]'s patient encounter information:   HYQ:?[id number]  Account [id number]  Billing Account [id number]      Criteria Met      5 ED Visits in 12 Months    Security and Safety  No Security Events were found.  ED Care Guidelines  There are currently no ED Care Guidelines for this patient. Please check your facility's medical records system.        Prescription Monitoring Program  Narx Score not available at this time.    [name].D. Visit Count (12 mo.)  Facility Visits   [place] [facility] 5   Total 5   Note: Visits indicate total known visits.     Recent Emergency Department Visit Summary  Date Facility [facility] Type Diagnoses or Chief Complaint    Feb 10, 2023  [place]  [place]  Emergency      [facility]      Dec 20, 2022  [place].  [place]  Emergency      Acute kidney failure, unspecified      Supraventricular tachycardia, unspecified      Chronic kidney disease, unspecified      Shortness of Breath      Chest Pain      Shortness of Breath; Emesis; Fatigue      Sep 04, 2022  [place].  [place]  Emergency      Other specified abnormal findings of blood chemistry      Acute on chronic right heart failure      Shortness of Breath      SOB      Apr 18, 2022  [place] - [place].  [place]  Emergency      Angioneurotic edema, initial encounter      Allergy, unspecified, initial encounter      Allergic Reaction      allergic reaction, facial swelling, rash all over body      Mar 09, 2022  [place].  [place]  Emergency      Other specified disorders of the male genital organs      Shortness of breath      Other intra-abdominal and pelvic swelling, mass and lump      Heart failure, unspecified      Leg Swelling      Shortness of Breath      Groin Swelling      Hydrocele        Recent Inpatient Visit Summary  Date Facility [facility] Type Diagnoses or Chief  Complaint    Dec 21, 2022  [place] [facility] [place] [facility]  [place]  [place]  Inpatient      Encounter for adjustment and management of vascular access device      Acute on chronic right heart failure      Ischemic cardiomyopathy      Chronic systolic (congestive) heart failure      Other cardiomyopathies      Heart failure, unspecified      Cardiogenic shock      Dec 20, 2022  [place] [facility] [place].  [place]  Medical Surgical      Supraventricular tachycardia, unspecified      Acute kidney failure, unspecified      Chronic kidney disease, unspecified      Sep 09, 2022  [place] [facility]  [place] [facility]  [place]  [place]  Medical Surgical      Chronic combined systolic (congestive) and diastolic (congestive) heart failure      Ischemic cardiomyopathy      Sep 04, 2022  [place] [facility] [place]  [place]  Medical Surgical      Ischemic cardiomyopathy      Other specified abnormal findings of blood chemistry      Acute on chronic right heart failure      Mar 09, 2022  [place] [facility] [place].  [place]  Medical Surgical      Chronic systolic (congestive) heart failure      Other cardiomyopathies      Shortness of breath      Other specified disorders of the male genital organs      Heart failure, unspecified        Care Team  No Care Team was found.  [facility]  This patient has registered at the [facility] Emergency Department  For more information visit: [email or web address]     PLEASE NOTE:     1.   Any care recommendations and other clinical information are provided as guidelines or for historical purposes only, and providers should exercise their own clinical judgment when providing care.    2.   You may only use this information for purposes of treatment, payment or health care operations activities, and subject to the limitations of applicable [facility] Policies.    3.   You should consult directly with the organization that provided a care guideline or other clinical history  with any questions about additional information or accuracy or completeness of information provided.    ? 2024 [facility] - [email or web address]

## 2023-02-10 NOTE — Progress Notes (Signed)
4 eyes in 4 hours pressure injury assessment note:      Completed with: Dellia Beckwith, CT  Unit & Time admitted: [address]            Bony Prominences: Check appropriate box; if wound is present enter wound assessment in LDA     Occiput:                 [x] WNL  []  Wound present  Face:                     [x] WNL  []  Wound present  Ears:                      [x] WNL  []  Wound present  Spine:                    [x] WNL  []  Wound present  Shoulders:             [x] WNL  []  Wound present  Elbows:                  [x] WNL  []  Wound present  Sacrum/coccyx:     [x] WNL  []  Wound present  Ischial Tuberosity:  [x] WNL  []  Wound present  Trochanter/Hip:      [x] WNL  []  Wound present  Knees:                   [x] WNL  []  Wound present  Ankles:                   [x] WNL  []  Wound present  Heels:                    [x] WNL  []  Wound present  Other pressure areas:  []  Wound location       Device related: []  Device name:         LDA completed if wound present: NO  Consult WOCN if necessary    Other skin related issues, ie tears, rash, etc, document in Integumentary flowsheet

## 2023-02-10 NOTE — Consults (Signed)
ADMISSION HISTORY AND PHYSICAL EXAM    Date Time: 02/10/23 4:19 PM  Patient Name: [name] [name]  Attending Physician: Abram Sander, DO      History of Present Illness:   [name] [name] is a 77 y.o. male who presents with: Severe anemia and Hemoccult positive stool.  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    Coronary artery disease 2011    CORONARY STENT PLACEMENT FOLLOWED BY [facility]    Depression     echocardiogram 02/2016, 11/2016, 09/2017, 12/20/2017    GERD (gastroesophageal reflux disease)     Heart attack 2011    and 2014    Hyperlipemia     Hypertension     ICD (implantable 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     PLACED 2014  [facility]  LAST INTERROGATION April 01 2018 REPORT REQUESTED    Ischemic cardiomyopathy     EF 15% ON ECHO 09-20-2017 DR GARG IN EP[facility]    Nonischemic cardiomyopathy     NSTEMI (non-ST elevated myocardial infarction)     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    Pacemaker 2014    MEDTRONIC ICD/PACEMAKER COMBO LAST INTERROGATION  12-12 2018    Pneumonia 09/2016    Primary cardiomyopathy     Ischemic cardiomyopathy EF 15% ON ECHO 09-20-2017 DR GARG IN EPIC    Sepsis 08/2017    Syncope and collapse     PRIOR TO ICD/PACEMAKER PLACED 2014    Type 2 diabetes mellitus, controlled DX 1998    BS  AVG 100  A1C 7.4 Dec 27 2017    [facility]     PT SEE HIS PMD April 01 2018 RE THIS-TOLD TO SEE PMD BY [facility] JUNE 12 WHEN THEY SAW HIM FOR A CL       Past Surgical History:     Past Surgical History:   Procedure Laterality Date    BIV  11/10/2016     ICD METRONIC  UPGRADE     CARDIAC CATHETERIZATION  03/2010    CORONARY STENT PLACEMENT X 2 PER PT; LM normal, LAD 70-80% distal lesion at apex, 20% lesion mid CFX    CARDIAC CATHETERIZATION  12/2012    CARDIAC DEFIBRILLATOR PLACEMENT  12/2012    [facility]    CARDIAC PACEMAKER PLACEMENT  2014    MEDTRONIC ICD/PACEMAKER PLACED [facility]    CIRCUMCISION  AGE 74    COLONOSCOPY, DIAGNOSTIC (SCREENING)  2009    CORRECTION HAMMER TOE      duodenal ulcer  1973    STRESS RELATED IN SCHOOL    ECHOCARDIOGRAM, TRANSTHORACIC  02/2016 11/2016 09/2017 12/2017    ECHOCARDIOGRAM, TRANSTHORACIC  02/2016,11/2016,12/20/2017    EGD N/A 08/15/2014    Procedure: EGD;  Surgeon: Colon Branch, MD;  Location: [facility];  Service: Gastroenterology;  Laterality: N/A;  egd w/ bx  EGD  1975    EGD, COLONOSCOPY N/A 04/04/2018    Procedure: EGD with bxs, COLONOSCOPY with polypectomy and clipping;  Surgeon: Doyne Keel, MD;  Location: [facility];  Service: Gastroenterology;  Laterality: N/A;    FRACTURE SURGERY  AGE 89     RT  ANKLE- CAST APPLIED    HERNIA REPAIR  AGE 43    LEFT INGUINAL HERNIA    MPI nuclear study  06/2016    orthopedic surgery  AGE 73    RT foot corrective - HAMMERTOE    RIGHT HEART CATH Right 09/09/2022    Procedure: Right Heart Cath;  Surgeon: Burna Forts, MD;  Location: [facility];  Service: Cardiovascular;  Laterality: Right;  tx from FO    RIGHT HEART CATH ONLY INCL. CO AND SATS Right 12/22/2022    Procedure: Right Heart Cath ONLY incl. CO and Sats;  Surgeon: Day, Bertram Millard, MD;  Location: [facility];  Service: Cardiovascular;  Laterality: Right;  Right Heart Cath with leave in PA cath for milrinone initiation    TONSILLECTOMY AND ADENOIDECTOMY  1954    TUNNELED CATH PLACEMENT ([facility]ATH) N/A 12/25/2022    Procedure: [facility];  Surgeon: Tamala Bari, MD;  Location: [facility];  Service: Interventional Radiology;  Laterality: N/A;    TUNNELED CATH REMOVAL ([facility]ATH) Right  01/04/2023    Procedure: TUNNELED CATH REMOVAL;  Surgeon: Rex Kras, MD;  Location: [facility];  Service: Interventional Radiology;  Laterality: Right;       Family History:     Family History   Problem Relation Age of Onset    Breast cancer Mother     Heart attack Mother 27    Hypertension Mother     Diabetes Mother     Coronary artery disease Mother     Diabetes Brother     Heart attack Father     Hypertension Father        Social History:     Social History     Socioeconomic History    Marital status: Married     Spouse name: Teacher, music    Number of children: 0    Years of education: Not on file    Highest education level: Not on file   Occupational History    Occupation: Runner, 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, [place]    Tobacco Use    Smoking status: Never    Smokeless tobacco: Never   Vaping Use    Vaping status: Never Used   Substance and Sexual Activity    Alcohol use: Not Currently    Drug use: No    Sexual activity: Not Currently     Partners: Female   Other Topics Concern    Not on file   Social History Narrative    Not on file     Social Determinants of Health     Financial Resource Strain: Low Risk  (01/17/2023)    Overall Financial Resource Strain (CARDIA)     Difficulty of Paying Living Expenses: Not hard at all   Food Insecurity: No Food Insecurity (02/10/2023)    Hunger Vital Sign     Worried About Running Out of Food in the Last Year: Never true     Ran Out of Food in the Last Year: Never true   Transportation Needs: No Transportation Needs (01/17/2023)    PRAPARE - Therapist, art (Medical): No     Lack of Transportation (  Non-Medical): No   Physical Activity: Insufficiently Active (01/17/2023)    Exercise Vital Sign     Days of Exercise per Week: 1 day     Minutes of Exercise per Session: 10 min   Stress: Stress Concern Present (01/17/2023)    [employer] of Occupational Health - Occupational Stress Questionnaire     Feeling of Stress : To some extent   Social Connections: Moderately  Integrated (01/17/2023)    Social Connection and Isolation Panel [NHANES]     Frequency of Communication with Friends and Family: More than three times a week     Frequency of Social Gatherings with Friends and Family: Once a week     Attends Religious Services: More than 4 times per year     Active Member of [employer] or Organizations: No     Attends Banker Meetings: Never     Marital Status: Married   Catering manager Violence: Not At Risk (02/10/2023)    Humiliation, Afraid, Rape, and Kick questionnaire     Fear of Current or Ex-Partner: No     Emotionally Abused: No     Physically Abused: No     Sexually Abused: No   Housing Stability: Low Risk  (01/17/2023)    Housing Stability Vital Sign     Unable to Pay for Housing in the Last Year: No     Number of Places Lived in the Last Year: 1     Unstable Housing in the Last Year: No       Allergies:     Allergies   Allergen Reactions    Allopurinol        Medications:     Medications Prior to Admission   Medication Sig    amiodarone (PACERONE) 200 MG tablet Take 1 tablet (200 mg) by mouth daily    apixaban (ELIQUIS) 5 MG Take 1 tablet (5 mg) by mouth every 12 (twelve) hours For 1 week followed by 1 tablet twice daily    aspirin EC 81 MG EC tablet Take 1 tablet (81 mg) by mouth daily    atorvastatin (LIPITOR) 40 MG tablet Take 1 tablet (40 mg total) by mouth nightly    Continuous Blood Gluc Sensor (FreeStyle Libre 3 Sensor) Misc Use 1 Device every 14 (fourteen) days    hydrALAZINE (APRESOLINE) 100 MG tablet Take 1 tablet (100 mg) by mouth every 8 (eight) hours    insulin glargine 100 UNIT/ML injection Inject 33 Units into the skin every 12 (twelve) hours    insulin lispro, 1 Unit Dial, 100 UNIT/ML pen-injector Inject 10 Units into the skin 3 (three) times daily before meals    isosorbide dinitrate (ISORDIL) 40 MG tablet Take 1 tablet (40 mg) by mouth 3 (three) times daily    loratadine (CLARITIN) 10 MG tablet Take 1 tablet (10 mg) by mouth daily    metoprolol  succinate XL (TOPROL-XL) 25 MG 24 hr tablet Take 1 tablet (25 mg) by mouth daily    niacin (NIASPAN) 500 MG CR tablet TAKE 1 TABLET BY MOUTH TWICE DAILY    torsemide 40 MG Tab Take 2 tablets (80 mg) by mouth 2 (two) times daily    Insulin Pen Needle (B-D UF III MINI PEN NEEDLES) 31G X 5 MM Misc Use as directed daily       Review of Systems:   A comprehensive review of systems was: Negative except as above.    Physical Exam:     Vitals:  02/10/23 1515   BP: 109/70   Pulse: 71   Resp: 17   Temp: 97.4 F (36.3 C)   SpO2: 99%       Intake and Output Summary (Last 24 hours) at Date Time    Intake/Output Summary (Last 24 hours) at 02/10/2023 1619  Last data filed at 02/10/2023 1318  Gross per 24 hour   Intake --   Output 300 ml   Net -300 ml       Mental status - alert, oriented to person, place, and time.  Skin:  No rash or significant lesions.  Ent-No icterus or lymphadenopathy  Chest - clear to auscultation.  No wheezes, rales or rhonchi, symmetric air entry  Heart - normal rate, regular rhythm, normal S1, S2, no murmurs, rubs, clicks or gallops  Abdomen - soft, nontender, nondistended, no masses or organomegaly.  Back-no cpat.  Extremities - peripheral pulses normal, no pedal edema, no clubbing or cyanosis    Labs:     Results       Procedure Component Value Units Date/Time    NT-proBNP [[id number]]  (Abnormal) Collected: 02/10/23 1131     Updated: 02/10/23 1418     NT-proBNP 5,990 pg/mL     Prepare / Crossmatch Red Blood Cells:  One Unit, 1 Units [[id number]] Collected: 02/10/23 1215     Updated: 02/10/23 1350     RBC Leukoreduced RBC Leukoreduced     BLUNIT [id number]     Status issued     PRODUCT CODE (NON READABLE) E0336V00     Expiration Date [id number]     UTYPE O POS     ISBT CODE 5100    ABO/Rh [[id number]] Collected: 02/10/23 1215    Specimen: Blood Updated: 02/10/23 1333     ABO Rh O POS    Antibody Screen [[id number]] Collected: 02/10/23 1215     Updated: 02/10/23 1333     AB Screen Gel NEG    Urinalysis  Reflex to Microscopic Exam- Reflex to Culture [[id number]]  (Abnormal) Collected: 02/10/23 1234    Specimen: Urine, Clean Catch Updated: 02/10/23 1255     Urine Type Urine, Clean Ca     Color, UA Colorless     Clarity, UA Clear     Specific Gravity UA 1.011     Urine pH 6.0     Leukocyte Esterase, UA Negative     Nitrite, UA Negative     Protein, UR Negative     Glucose, UA 300= 3+     Ketones UA Negative     Urobilinogen, UA Normal mg/dL      Bilirubin, UA Negative     Blood, UA Negative    High Sensitivity Troponin-I [[id number]]  (Abnormal) Collected: 02/10/23 1131    Specimen: Blood Updated: 02/10/23 1223     hs Troponin-I 43.5 ng/L     Comprehensive metabolic panel [[id number]]  (Abnormal) Collected: 02/10/23 1131    Specimen: Blood Updated: 02/10/23 1215     Glucose 360 mg/dL      BUN 81.0 mg/dL      Creatinine 2.6 mg/dL      Sodium 175 mEq/L      Potassium 4.2 mEq/L      Chloride 102 mEq/L      CO2 22 mEq/L      Calcium 8.9 mg/dL      Protein, Total 6.1 g/dL      Albumin 3.3 g/dL      AST (SGOT) 34 U/L  ALT 26 U/L      Alkaline Phosphatase 86 U/L      Bilirubin, Total 0.7 mg/dL      Globulin 2.8 g/dL      Albumin/Globulin Ratio 1.2     Anion Gap 12.0     eGFR 24.8 mL/min/1.73 m2     Cell MorpHology [[id number]]  (Abnormal) Collected: 02/10/23 1131     Updated: 02/10/23 1215     Cell Morphology Abnormal     Platelet Estimate Decreased     Macrocytic 2+     Polychromasia Present     Ovalocytes Present    CBC and differential [[id number]]  (Abnormal) Collected: 02/10/23 1131    Specimen: Blood Updated: 02/10/23 1214     WBC 8.80 x10 3/uL      Hgb 6.3 g/dL      Hematocrit 63.8 %      Platelets 130 x10 3/uL      RBC 1.80 x10 6/uL      MCV 115.0 fL      MCH 35.0 pg      MCHC 30.4 g/dL      RDW 17 %      MPV 12.5 fL      Instrument Absolute Neutrophil Count 4.41 x10 3/uL      Neutrophils 50.2 %      Lymphocytes Automated 45.9 %      Monocytes 2.8 %      Eosinophils Automated 0.7 %      Basophils Automated 0.1 %       Immature Granulocytes 0.3 %      Nucleated RBC 0.8 /100 WBC      Neutrophils Absolute 4.41 x10 3/uL      Lymphocytes Absolute Automated 4.04 x10 3/uL      Monocytes Absolute Automated 0.25 x10 3/uL      Eosinophils Absolute Automated 0.06 x10 3/uL      Basophils Absolute Automated 0.01 x10 3/uL      Immature Granulocytes Absolute 0.03 x10 3/uL      Absolute NRBC 0.07 x10 3/uL     PT/APTT [[id number]]  (Abnormal) Collected: 02/10/23 1131     Updated: 02/10/23 1212     PT 22.1 sec      PT INR 1.9     PTT 33 sec             Rads:     Radiology Results (24 Hour)       ** No results found for the last 24 hours. **            Assessment:   Severe anemia and Hemoccult positive stool: Patient has a history of duodenal ulcer requiring surgical intervention in 1972.  Was diagnosed with gastritis and H. pylori infection at the time of EGD in 2019 for which she was given treatment.  Patient has been on aspirin which would increase his risk for gastritis, gastric ulcer, duodenitis and/or duodenal ulceration.    I am recommending an EGD to assess for the above gastric and duodenal disorders.  Rationale for the procedure as well as details and risks including reaction to sedation, bleeding and perforation requiring surgery were explained to the patient.  Patient verbalized an understanding and provided informed consent to proceed with EGD.    Patient has a history of multiple adenomatous colon polyps.  Had a 2.2 cm adenomatous polyp resected in 2019 for which the patient did not proceed with recommended follow-up.  Patient may have developed a metachronous colon  or rectal polyp or neoplasm that may be contributing to severe anemia and Hemoccult positive stool.  I am recommending a colonoscopy to assess for colon polyps, colon neoplasm, segmental inflammation and vascular malformation.  Rationale for the procedure as well as details and risks including reaction to sedation, bleeding and perforation requiring surgery were explained  to the patient.  Patient verbalized an understanding and provided informed consent to proceed with colonoscopy.    Agree with holding aspirin, Eliquis and any other anticoagulants.  In addition, blood transfusion.    History of myocardial infarction, ischemic cardiomyopathy status post pacemaker and defibrillator: I spoke with his cardiologist, Dr. Charm Barges, regarding his current cardiac status and optimal timing for EGD and colonoscopy.  Dr. Charm Barges approved for the patient to proceed with EGD and colonoscopy for tomorrow.    Duration of encounter, review of records and coordination of care: These exceeded 80 minutes.    PT/INR elevation: Due to Eliquis.  This is being held.  Will repeat PT/INR tomorrow morning.    Plan:   As outlined above.        Signed by: Doyne Keel, MD

## 2023-02-10 NOTE — Plan of Care (Signed)
Problem: Everyday - Heart Failure  Goal: Stable Vital Signs and Fluid Balance  Outcome: Progressing  Flowsheets (Taken 02/10/2023 1836 by Penelope Galas, RN)  Stable Vital Signs and Fluid Balance:   Daily Standing Weights in the morning using the same scale, after using the bathroom and before breadfast.  If unable to stand, zero the bed and use the bed scale   Monitor, assess vital signs and telemetry per policy   Fluid Restriction   Monitor labs and report abnormalities to physician   Assess for swelling/edema   Wean oxygen as needed if appropriate   Strict Intake/Output  Goal: Mobility/Activity is Maintained at Optimal Level for Patient  Outcome: Progressing  Flowsheets (Taken 02/10/2023 1836 by Penelope Galas, RN)  Mobility/Activity is Maintained at Optimal Level for Patient:   Increase mobility as tolerated/progressive mobility protocol   Maintain SCD's as Ordered   Perform active/passive ROM   Assess for changes in respiratory status, level of consciousness and/or development of fatigue   Reposition patient every 2 hours and as needed unless able to reposition self   Consult with Physical Therapy and/or Occupational Therapy  Goal: Nutritional Intake is Adequate  Outcome: Progressing  Flowsheets (Taken 02/10/2023 1836 by Penelope Galas, RN)  Nutritional Intake is Adequate:   Cardiac diet-2 gm Sodium   Fluid Restricction if needed   Consult/Collaborate with Nutritionist   Assess appetite,anorexia and amount of meal/food tolerated   Patient and family teaching on low sodium diet   Encourage/perform oral hygiene as appropriate  Goal: Teaching-Using CHF Warning Zones and Educational Videos  Outcome: Progressing  Flowsheets (Taken 02/10/2023 1836 by Penelope Galas, RN)  Teaching-Using CHF Warning Zones and Educational Videos:   Signs & Symptoms of CHF   Sodium Restriction   Fluid Restriction if appropriate   Daily Standing Weights & record   Document in the Education Tab in EPIC with Teach-back   CHF Warning Zones and when to call  for help   Medications

## 2023-02-10 NOTE — Consults (Signed)
[place] [facility] CARDI[facility] CONSULTATION REPORT    [facility]  [place]  [address]  [place] [address]  Dept: [phone]  Dept Fax: [phone]  Loc: [phone]      Date Time: 02/10/23 2:09 PM  Patient Name: [name] [name]  Requesting Physician: Abram Sander, DO    Reason for Consultation:   Acute on chronic systolic heart failure, acute GI bleed requiring multiple blood transfusions    History of Present Illness:   [name] [name] is a 77 y.o. male admitted on 02/10/2023.  We have been asked by Abram Sander, DO to provide cardiac consultation regarding heart failure in the setting of worsening anemia.  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  His last known ejection fraction in November 2023 was 21%.  He does have a Medtronic BiV ICD.  No coronary disease by cardiac catheterization in 2014.  He was just in our office this morning and was stable from a cardiac protective.  He was denying any symptoms of heart failure though did have some mild increase in lower extremity edema.  He then followed up with his oncologist who noted a hemoglobin of 6.3, his hemoglobin was 10.5 last month.  Secondary to that he requested him to go to the emergency room for blood transfusion and observation overnight for evaluation of his acute anemia.    Secondary to his history of severe systolic dysfunction we have been asked to assist with posttransfusion diuretics and monitoring for acute heart failure.      Hgb 6.3  Bun/creat 62/2.6  Trop 43    Cardiology relevant medical records in Epic chart reviewed.    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    Patient  has a past surgical history that includes duodenal ulcer (1973); orthopedic surgery (AGE 64); EGD (N/A, 08/15/2014); Circumcision (AGE 53); Fracture surgery (AGE 22); Hernia repair (AGE 53); COLONOSCOPY, DIAGNOSTIC (SCREENING) (2009); Tonsillectomy and adenoidectomy (1954); Cardiac defibrillator placement (12/2012); Cardiac pacemaker placement (2014); EGD (1975); EGD, COLONOSCOPY (N/A, 04/04/2018); ECHOCARDIOGRAM, TRANSTHORACIC (02/2016 11/2016 09/2017 12/2017); MPI nuclear study (06/2016); Correction hammer toe; BIV (11/10/2016); ECHOCARDIOGRAM, TRANSTHORACIC (02/2016,11/2016,12/20/2017); Cardiac catheterization (03/2010); Cardiac catheterization (12/2012); Right Heart Cath (Right, 09/09/2022); Right Heart Cath ONLY incl. CO and Sats (Right, 12/22/2022); Tunneled Cath Placement (Permcath) (N/A, 12/25/2022); and Tunneled Cath Removal (Permcath) (Right, 01/04/2023).    Family History:   Patient's family history includes Breast cancer in his mother; Coronary artery disease in his mother; Diabetes in his brother and mother; Heart attack in his father; Heart attack (age of onset: 46) in his mother; Hypertension in his father and mother.    Social History:   Patient  reports that he has never smoked. He has never used smokeless tobacco. He reports that he does not currently use  alcohol. He reports  that he does not use drugs.    Allergies:     Allergies   Allergen Reactions    Allopurinol        Medications:     Current Outpatient Medications   Medication Instructions    amiodarone (PACERONE) 200 mg, Oral, Daily    apixaban (ELIQUIS) 5 mg, Oral, Every 12 hours, For 1 week followed by 1 tablet twice daily    aspirin EC 81 mg, Oral, Daily    atorvastatin (LIPITOR) 40 mg, Oral, At bedtime    Continuous Blood Gluc Sensor (FreeStyle Libre 3 Sensor) Misc 1 Device, Does not apply, Every 14 days    hydrALAZINE (APRESOLINE) 100 mg, Oral, Every 8 hours scheduled    insulin glargine (LANTUS) 33 Units, Subcutaneous, Every 12 hours    insulin lispro (1 Unit Dial) (HUMALOG KWIKPEN) 10 Units, Subcutaneous, 3 times daily before meals    Insulin Pen Needle (B-D UF III MINI PEN NEEDLES) 31G X 5 MM Misc Use as directed daily    isosorbide dinitrate (ISORDIL) 40 mg, Oral, 3 times daily with nitrate free interval    loratadine (CLARITIN) 10 mg, Oral, Daily    metoprolol succinate XL (TOPROL-XL) 25 mg, Oral, Daily    niacin (NIASPAN) 500 MG CR tablet TAKE 1 TABLET BY MOUTH TWICE DAILY    Torsemide 80 mg, Oral, 2 times daily       Inpatient Scheduled Meds: PRN Meds:    [START ON 02/11/2023] pantoprazole, 40 mg, Intravenous, Daily        Continuous Infusions:   acetaminophen, 650 mg, TID PRN  benzocaine-menthol, 1 lozenge, Q2H PRN  benzonatate, 100 mg, TID PRN  carboxymethylcellulose sodium, 1 drop, TID PRN  naloxone, 0.2 mg, PRN  ondansetron, 4 mg, Q6H PRN   Or  ondansetron, 4 mg, Q6H PRN  saline, 2 spray, Q4H PRN          Physical Exam:     Vitals:    02/10/23 1243 02/10/23 1358 02/10/23 1400 02/10/23 1401   BP: 102/66 94/64 97/68     Pulse: 71 65 68 70   Resp: (!) 24 17 14 16    Temp:  97.6 F (36.4 C)     TempSrc:       SpO2: 99%   98%   Weight:       Height:           Constitutional: Cooperative, alert, no acute distress.  Neck: No carotid bruits, JVP is elevated   Cardiac: Regular rate and rhythm, normal S1  and S2; no S3 or S4, + systolic murmur, no rubs, no gallops.  Pulmonary: Clear to auscultation bilaterally, no wheezing, no rhonchi, no rales.  Extremities: 1-2+ pitting edema bilaterally   Vascular: +2 pulses in radial artery bilaterally, 2+ pedal pulses bilaterally.    Lab/Test Findings Reviewed:     ECG reviewed: AV paced rhythm      Recent Labs     02/10/23  1131   Sodium 136   Potassium 4.2   Chloride 102   CO2 22   BUN 62.0*   Creatinine 2.6*   Calcium 8.9   WBC 8.80   Hgb 6.3*   Hematocrit 20.7*   Platelets 130*   hs Troponin-I 43.5*   AST (SGOT) 34   ALT 26       ASSESSMENT:   Patient is a 77 y.o. 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  Change to lasix 80 mg IV BID for now (will get 80 now and currently receiving PRBC)  Any additional PRBC transfusion I would follow with lasix 40 mg IV x1  Mild edema and JVD so will use IV lasix (may need PRN metolazone) to optimize while here.  Daily standing weights please with strict I and O's    Recommendations discussed with primary medical team.  -------------------------------------------------------------------------    Signed by:         Amado Nash, PA     Attestation:  I have seen the patient and performed the substantive portion of the visit with my medical decision making and agree with the APP findings.  I have reviewed and updated the documented findings and plan of care accordingly  with any additions outlined below     Patient is a 77 year old man with a history of NICM with LVEF 21% s/p BiV ICD, RLE DVT on apixaban found to have acute Hb drop concerning for GIB.  - Patient is clinically hypervolemic and likely to continue receiving blood products.  Transition oral torsemide to furosemide 80 mg IV BID.  - Agree with holding apixaban and ASA.  Patient does not seem to have a concomitant indication for ASA from a cardiac perspective, and would not plan to resume.  - Agree with holding hydral/nitrates and metoprolol for now, pending recovery of BP.  - Patient is elevated, but acceptable, cardiac risk for EGD/colo if required.    General Appearance:  Well appearing, no acute distress  Skin: Warm and dry  Head: Normocephalic, atraumatic  Eyes: Conjunctivae and lids unremarkable.  Neck: No carotid bruit, JVD  Chest: Clear to auscultation bilaterally with good air movement and respiratory effort and no wheezes, rales, or rhonchi   Cardiovascular: Regular rhythm, S1 normal, S2 normal, No S3 or S4.  II/VI holosystolic murmur at LSB. No gallops or rubs detected   Extremities: Warm with 1+ bilateral LE edema to knee. 2+ radial pulses bilaterally.  Neuro: No gross motor or sensory deficits noted.    Overall Complexity: High with the following Diagnoses: Acute on chronic systolic heart failure (decompensated)    Overall  Risk: High due to Monitoring renal function and electrolytes closely in the setting of aggressive IV diuresis    Verner Mould, MD      [facility] Contact Information   [facility]  Secure Chat (Group):   [facility] [place] [facility]    APP Spectralink:  [phone]    MD Spectralink :  [phone]  [phone]    After hours, non urgent consult line:  [phone]    After hours, physician on-call:  [phone] [facility]  Secure Chat (Group):   [facility] [place] [facility]    AP (P Spectralink: last month)[phone]    MD Spectralink :  [phone]      After hours, non  urgent  consult line:  [phone]    After hours, physician on-call:  [phone] [facility]  Secure Chat (Group):   [facility] [place] [facility]    APP Spectralink:  [phone]    MD Spectralink :  [phone]      After hours, non urgent consult line:  [phone]    After hours, physician on-call:  [phone] [facility]  Secure Chat (Group):   [place] [facility]    APP Spectralink:  [phone]    MD Spectralink :  [phone]      After hours, non urgent consult line:  [phone]    After hours, physician on-call:  [phone]       This note was generated by the Dragon speech recognition and may contain errors or omissions not intended by the user. Grammatical errors, random word insertions, deletions, pronoun errors, and incomplete sentences are occasional consequences of this technology due to software limitations. Not all errors are caught or corrected. If there are questions or concerns about the content of this note or information contained within the body of this dictation, they should be addressed directly with the author for clarification.

## 2023-02-10 NOTE — Progress Notes (Signed)
[place] [facility] ADVANCED HEART FAILURE OFFICE PROGRESS NOTE    [facility] OFFICE -CARDIOLOGY  [address]  [place] [address]  Dept: [phone]  Dept Fax: [phone]       Patient Name: [name] [name]    Date of Visit:  February 10, 2023  Date of Birth: 09-23-1946  AGE: 77 y.o.  Medical Record #: [id number]      CHIEF COMPLAINT:    Chief Complaint   Patient presents with    Follow-up    Cardiomyopathy    Congestive Heart Failure       HISTORY OF PRESENT ILLNESS:    I had the pleasure of seeing [name] today in follow up in the Advanced Heart Failure Clinic at [facility]. He is a pleasant 77 y.o. male who has history of chronic systolic HF and nonischemic cardiomyopathy.  He has had worsening episodes of heart failure requiring admission in November 2023 and again in February 2024.  With his renal function not improving, he was not deemed a candidate for an LVAD.  Given his advanced age, he does not qualify for heart transplant. He was eventually discharged with plans for hospice.    At the last visit with Dr. Webb Silversmith in March, he was restarted on metoprolol succinate 25 mg daily. He is also on Hydralazine and Isordil for afterload reduction. Much of his GDMT has also been discontinued due to worsening renal function.     He connected with hospice since our last visit. He has a home health nurse and physical therapist who visit him. He recently saw Dr. Renaee Munda who was pleased with his improving kidney function. He remains on Torsemide 80mg  twice daily dosing. Denies any worsening shortness of breath, orthopnea, PND or edema. Weight remains stable.  He follows closely with Dr. Laurine Blazer at [facility] for CLL, and is scheduled to see him after our visit today.     Most recent BMP from 01/20/2023 showed BUN 76, Cr. 2.8, and potassium 4.0.     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 He has a past surgical history that includes duodenal ulcer (1973); orthopedic surgery (AGE 36); EGD (N/A, 08/15/2014); Circumcision (AGE 89); Fracture surgery (AGE 40); Hernia repair (AGE 89); COLONOSCOPY, DIAGNOSTIC (SCREENING) (2009); Tonsillectomy and adenoidectomy (1954); Cardiac defibrillator placement (12/2012); Cardiac pacemaker placement (2014); EGD (1975); EGD, COLONOSCOPY (N/A, 04/04/2018); ECHOCARDIOGRAM, TRANSTHORACIC (02/2016 11/2016 09/2017 12/2017); MPI nuclear study (06/2016); Correction hammer toe; BIV (11/10/2016); ECHOCARDIOGRAM, TRANSTHORACIC (02/2016,11/2016,12/20/2017); Cardiac catheterization (03/2010); Cardiac catheterization (12/2012); Right Heart Cath (Right, 09/09/2022); Right Heart Cath ONLY incl. CO and Sats (Right, 12/22/2022); Tunneled Cath Placement (Permcath) (N/A, 12/25/2022); and Tunneled Cath Removal (Permcath) (Right, 01/04/2023).    ALLERGIES:   Allergies   Allergen Reactions    Allopurinol        MEDICATIONS:   Current Outpatient Medications:     apixaban (ELIQUIS) 5 MG, Take 1 tablet (5 mg) by mouth every 12 (  twelve) hours For 1 week followed by 1 tablet twice daily, Disp: 90 tablet, Rfl: 2    aspirin EC 81 MG EC tablet, Take 1 tablet (81  mg) by mouth daily, Disp: , Rfl:     atorvastatin (LIPITOR) 40 MG tablet, Take 1 tablet (40 mg total) by mouth nightly, Disp: 90 tablet, Rfl: 0    Continuous Blood Gluc Sensor (FreeStyle Libre 3 Sensor) Misc, Use 1 Device every 14 (fourteen) days, Disp: 6 each, Rfl: 3    hydrALAZINE (APRESOLINE) 100 MG tablet, Take 1 tablet (100 mg) by mouth every 8 (eight) hours, Disp: 90 tablet, Rfl: 0    insulin glargine 100 UNIT/ML injection, Inject 33 Units into the skin every 12 (twelve) hours, Disp: 20 mL, Rfl: 3    insulin lispro, 1 Unit Dial, 100 UNIT/ML pen-injector, Inject 10 Units into the skin 3 (three) times daily before meals, Disp: 9 mL, Rfl: 0    Insulin Pen Needle (B-D UF III MINI PEN NEEDLES) 31G X 5 MM Misc, Use as directed daily, Disp: 200 each, Rfl: 3    isosorbide dinitrate (ISORDIL) 40 MG tablet, Take 1 tablet (40 mg) by mouth 3 (three) times daily, Disp: 90 tablet, Rfl: 0    loratadine (CLARITIN) 10 MG tablet, Take 1 tablet (10 mg) by mouth daily, Disp: 5 tablet, Rfl: 0    metoprolol succinate XL (TOPROL-XL) 25 MG 24 hr tablet, Take 1 tablet (25 mg) by mouth daily, Disp: 90 tablet, Rfl: 3    niacin (NIASPAN) 500 MG CR tablet, TAKE 1 TABLET BY MOUTH TWICE DAILY, Disp: 180 tablet, Rfl: 3    tablet Calquence 100 MG Tab, , Disp: , Rfl:     torsemide 40 MG Tab, Take 2 tablets (80 mg) by mouth 2 (two) times daily, Disp: 120 tablet, Rfl: 0    Toujeo SoloStar 300 UNIT/ML injection pen, ADMINISTER 33 UNITS UNDER THE SKIN EVERY 12 HOURS, Disp: 20 mL, Rfl: 3    amiodarone (PACERONE) 200 MG tablet, Take 1 tablet (200 mg) by mouth daily, Disp: 90 tablet, Rfl: 3     FAMILY HISTORY: family history includes Breast cancer in his mother; Coronary artery disease in his mother; Diabetes in his brother and mother; Heart attack in his father; Heart attack (age of onset: 89) in his mother; Hypertension in his father and mother.    SOCIAL HISTORY: He reports that he has never smoked. He has never used smokeless tobacco. He reports  that he does not currently use alcohol. He reports that he does not use drugs.      PHYSICAL EXAMINATION    Visit Vitals  BP 100/64 (BP Site: Left arm, Patient Position: Sitting, Cuff Size: Medium)   Pulse 75   Ht 1.829 m (6')   Wt 84.4 kg (186 lb)   BMI 25.23 kg/m               Last 3 Weights:       01/08/2023     9:57 AM 01/08/2023     1:52 PM 02/10/2023     9:52 AM   Weight Monitoring   Height 182.9 cm  182.9 cm   Weight 84.369 kg 84.369 kg 84.369 kg   BMI (calculated) 25.2 kg/m2 25.2 kg/m2 25.2 kg/m2         General Appearance:  Cooperative and in no acute distress.    Skin: Warm and dry to touch  Head: Normocephalic  Eyes: Conjunctivae and lids unremarkable.    Neck:  no JVD  Chest: Clear/diminished to auscultation bilaterall  Cardiovascular: Regular rhythm, S1 normal, S2 normal, 1/6 holosystolic murmur   Abdomen: Soft, nontender with normoactive bowel sounds.   Extremities: Warm/dry with non pitting BLE pretibial edema, +2 b/l radial pulses  Neuro: Alert and oriented x3. No gross motor or sensory deficits noted, affect appropriate.      IMPRESSION:   Mr. Woodrome is a 77 y.o. 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 Instructed to call for weight gain of 3lbs in one day or 5lbs in one week   Engage with hospice. He established care with them   Routine follow up with Dr. Laurine Blazer and Dr. Renaee Munda as planned  Keep scheduled appointment with Dr. Webb Silversmith next month, or sooner if needed                                                 No orders of the defined types were placed in this encounter.      Orders Placed This Encounter   Medications    amiodarone (PACERONE) 200 MG tablet     Sig: Take 1 tablet (200 mg) by mouth daily     Dispense:  90 tablet     Refill:  3         SIGNED:    Conception Oms, NP          This note was generated by the Dragon speech recognition and may contain errors or omissions not intended by the user. Grammatical errors, random word insertions, deletions, pronoun errors, and incomplete sentences are occasional consequences of this technology due to software limitations. Not all errors are caught or corrected. If there are questions or concerns about the content of this note or information contained within the body of this dictation, they should be addressed directly with the author for clarification.

## 2023-02-11 ENCOUNTER — Encounter: Payer: Self-pay | Admitting: Internal Medicine

## 2023-02-11 ENCOUNTER — Inpatient Hospital Stay: Payer: Commercial Managed Care - POS | Admitting: Residents

## 2023-02-11 ENCOUNTER — Encounter
Admission: EM | Disposition: A | Discharge status: Home Health | Payer: Self-pay | Source: Home / Self Care | Attending: Student in an Organized Health Care Education/Training Program

## 2023-02-11 DIAGNOSIS — I25810 Atherosclerosis of coronary artery bypass graft(s) without angina pectoris: Secondary | ICD-10-CM

## 2023-02-11 HISTORY — PX: EGD: SHX3789

## 2023-02-11 LAB — ECG 12-LEAD
Atrial Rate: 70 {beats}/min
P Axis: -1 degrees
P-R Interval: 204 ms
Q-T Interval: 464 ms
QRS Duration: 92 ms
QTC Calculation (Bezet): 501 ms
R Axis: -14 degrees
T Axis: 115 degrees
Ventricular Rate: 70 {beats}/min

## 2023-02-11 LAB — COMPREHENSIVE METABOLIC PANEL
ALT: 24 U/L (ref 0–55)
AST (SGOT): 22 U/L (ref 5–41)
Albumin/Globulin Ratio: 1.4 (ref 0.9–2.2)
Albumin: 3.2 g/dL — ABNORMAL LOW (ref 3.5–5.0)
Alkaline Phosphatase: 64 U/L (ref 37–117)
Anion Gap: 11 (ref 5.0–15.0)
BUN: 46 mg/dL — ABNORMAL HIGH (ref 9.0–28.0)
Bilirubin, Total: 1.9 mg/dL — ABNORMAL HIGH (ref 0.2–1.2)
CO2: 25 mEq/L (ref 17–29)
Calcium: 8.9 mg/dL (ref 7.9–10.2)
Chloride: 105 mEq/L (ref 99–111)
Creatinine: 1.9 mg/dL — ABNORMAL HIGH (ref 0.5–1.5)
Globulin: 2.3 g/dL (ref 2.0–3.6)
Glucose: 178 mg/dL — ABNORMAL HIGH (ref 70–100)
Potassium: 4 mEq/L (ref 3.5–5.3)
Protein, Total: 5.5 g/dL — ABNORMAL LOW (ref 6.0–8.3)
Sodium: 141 mEq/L (ref 135–145)
eGFR: 36.1 mL/min/{1.73_m2} — AB (ref >=60)

## 2023-02-11 LAB — CROSSMATCH PRBC, 1 UNIT
Expiration Date: 202405232359
ISBT CODE: 5100
Product Status: TRANSFUSED
Unit Blood Type: O POS

## 2023-02-11 LAB — CBC AND DIFFERENTIAL
Absolute NRBC: 0.08 10*3/uL — ABNORMAL HIGH (ref 0.00–0.00)
Basophils Absolute Automated: 0.01 10*3/uL (ref 0.00–0.08)
Basophils Automated: 0.1 %
Eosinophils Absolute Automated: 0.1 10*3/uL (ref 0.00–0.44)
Eosinophils Automated: 1.1 %
Hematocrit: 25.2 % — ABNORMAL LOW (ref 37.6–49.6)
Hgb: 7.7 g/dL — ABNORMAL LOW (ref 12.5–17.1)
Immature Granulocytes Absolute: 0.05 10*3/uL (ref 0.00–0.07)
Immature Granulocytes: 0.5 %
Instrument Absolute Neutrophil Count: 4.03 10*3/uL (ref 1.10–6.33)
Lymphocytes Absolute Automated: 5.15 10*3/uL — ABNORMAL HIGH (ref 0.42–3.22)
Lymphocytes Automated: 54.2 %
MCH: 32.4 pg (ref 25.1–33.5)
MCHC: 30.6 g/dL — ABNORMAL LOW (ref 31.5–35.8)
MCV: 105.9 fL — ABNORMAL HIGH (ref 78.0–96.0)
MPV: 12.1 fL (ref 8.9–12.5)
Monocytes Absolute Automated: 0.16 10*3/uL — ABNORMAL LOW (ref 0.21–0.85)
Monocytes: 1.7 %
Neutrophils Absolute: 4.03 10*3/uL (ref 1.10–6.33)
Neutrophils: 42.4 %
Nucleated RBC: 0.8 /100 WBC — ABNORMAL HIGH (ref 0.0–0.0)
Platelets: 107 10*3/uL — ABNORMAL LOW (ref 142–346)
RBC: 2.38 10*6/uL — ABNORMAL LOW (ref 4.20–5.90)
WBC: 9.5 10*3/uL (ref 3.10–9.50)

## 2023-02-11 LAB — WHOLE BLOOD GLUCOSE POCT
Whole Blood Glucose POCT: 157 mg/dL — ABNORMAL HIGH (ref 70–100)
Whole Blood Glucose POCT: 170 mg/dL — ABNORMAL HIGH (ref 70–100)
Whole Blood Glucose POCT: 171 mg/dL — ABNORMAL HIGH (ref 70–100)
Whole Blood Glucose POCT: 179 mg/dL — ABNORMAL HIGH (ref 70–100)
Whole Blood Glucose POCT: 189 mg/dL — ABNORMAL HIGH (ref 70–100)
Whole Blood Glucose POCT: 215 mg/dL — ABNORMAL HIGH (ref 70–100)
Whole Blood Glucose POCT: 230 mg/dL — ABNORMAL HIGH (ref 70–100)
Whole Blood Glucose POCT: 265 mg/dL — ABNORMAL HIGH (ref 70–100)

## 2023-02-11 LAB — PT/INR
PT INR: 1.3 — ABNORMAL HIGH (ref 0.9–1.1)
PT: 15.3 s — ABNORMAL HIGH (ref 10.1–12.9)

## 2023-02-11 LAB — IRON PROFILE
Iron Saturation: 22 % (ref 15–50)
Iron: 74 ug/dL (ref 41–168)
TIBC: 340 ug/dL (ref 261–462)
UIBC: 266 ug/dL (ref 126–382)

## 2023-02-11 LAB — CELL MORPHOLOGY
Cell Morphology: ABNORMAL — AB
Platelet Estimate: DECREASED — AB

## 2023-02-11 LAB — HEMOGLOBIN AND HEMATOCRIT
Hematocrit: 27.4 % — ABNORMAL LOW (ref 37.6–49.6)
Hgb: 8.5 g/dL — ABNORMAL LOW (ref 12.5–17.1)

## 2023-02-11 LAB — FERRITIN: Ferritin: 47.17 ng/mL (ref 21.80–274.70)

## 2023-02-11 LAB — HEMOLYSIS INDEX(SOFT): Hemolysis Index: 6 Index (ref 0–24)

## 2023-02-11 SURGERY — DONT USE, USE 1095-ESOPHAGOGASTRODUODENOSCOPY (EGD), DIAGNOSTIC
Anesthesia: Anesthesia General | Site: Esophagus | Wound class: Clean Contaminated

## 2023-02-11 MED ORDER — ETOMIDATE 2 MG/ML IV SOLN
INTRAVENOUS | Status: AC
Start: 2023-02-11 — End: ?
  Filled 2023-02-11: qty 20

## 2023-02-11 MED ORDER — METOCLOPRAMIDE HCL 5 MG/ML IJ SOLN
10.0000 mg | Freq: Once | INTRAMUSCULAR | Status: DC | PRN
Start: 2023-02-11 — End: 2023-02-11

## 2023-02-11 MED ORDER — ONDANSETRON HCL 4 MG/2ML IJ SOLN
4.0000 mg | Freq: Once | INTRAMUSCULAR | Status: DC | PRN
Start: 2023-02-11 — End: 2023-02-11

## 2023-02-11 MED ORDER — FENTANYL CITRATE (PF) 50 MCG/ML IJ SOLN (WRAP)
INTRAMUSCULAR | Status: AC
Start: 2023-02-11 — End: ?
  Filled 2023-02-11: qty 2

## 2023-02-11 MED ORDER — LIDOCAINE HCL (PF) 2 % IJ SOLN
INTRAMUSCULAR | Status: AC
Start: 2023-02-11 — End: ?
  Filled 2023-02-11: qty 5

## 2023-02-11 MED ORDER — PHENYLEPHRINE 100 MCG/ML IV BOLUS (ANESTHESIA)
PREFILLED_SYRINGE | INTRAVENOUS | Status: DC | PRN
Start: 2023-02-11 — End: 2023-02-11
  Administered 2023-02-11 (×2): 100 ug via INTRAVENOUS

## 2023-02-11 MED ORDER — FUROSEMIDE 10 MG/ML IJ SOLN
40.0000 mg | Freq: Once | INTRAMUSCULAR | Status: AC
Start: 2023-02-11 — End: 2023-02-11
  Administered 2023-02-11: 40 mg via INTRAVENOUS
  Filled 2023-02-11: qty 4

## 2023-02-11 MED ORDER — PROPOFOL INFUSION 10 MG/ML
INTRAVENOUS | Status: DC | PRN
Start: 2023-02-11 — End: 2023-02-11
  Administered 2023-02-11: 25 ug/kg/min via INTRAVENOUS

## 2023-02-11 MED ORDER — ROCURONIUM BROMIDE 50 MG/5ML IV SOLN
INTRAVENOUS | Status: AC
Start: 2023-02-11 — End: ?
  Filled 2023-02-11: qty 5

## 2023-02-11 MED ORDER — MIDAZOLAM HCL 1 MG/ML IJ SOLN (WRAP)
INTRAMUSCULAR | Status: AC
Start: 2023-02-11 — End: ?
  Filled 2023-02-11: qty 2

## 2023-02-11 MED ORDER — ETOMIDATE 2 MG/ML IV SOLN
INTRAVENOUS | Status: DC | PRN
Start: 2023-02-11 — End: 2023-02-11
  Administered 2023-02-11 (×2): 5 mg via INTRAVENOUS
  Administered 2023-02-11: 3 mg via INTRAVENOUS
  Administered 2023-02-11 (×3): 5 mg via INTRAVENOUS
  Administered 2023-02-11: 10 mg via INTRAVENOUS
  Administered 2023-02-11: 2 mg via INTRAVENOUS

## 2023-02-11 MED ORDER — DEXMEDETOMIDINE HCL 200 MCG/2ML IV SOLN
INTRAVENOUS | Status: AC
Start: 2023-02-11 — End: ?
  Filled 2023-02-11: qty 2

## 2023-02-11 MED ORDER — FLEET ENEMA 7-19 GM/118ML RE ENEM
1.0000 | ENEMA | Freq: Once | RECTAL | Status: AC
Start: 2023-02-11 — End: 2023-02-11
  Administered 2023-02-11: 1 via RECTAL

## 2023-02-11 MED ORDER — MIDAZOLAM HCL 1 MG/ML IJ SOLN (WRAP)
INTRAMUSCULAR | Status: DC | PRN
Start: 2023-02-11 — End: 2023-02-11
  Administered 2023-02-11: 2 mg via INTRAVENOUS

## 2023-02-11 MED ORDER — FLEET ENEMA 7-19 GM/118ML RE ENEM
1.0000 | ENEMA | Freq: Once | RECTAL | Status: DC
Start: 2023-02-11 — End: 2023-02-12

## 2023-02-11 MED ORDER — DEXMEDETOMIDINE BOLUS FROM BAG
Status: DC | PRN
Start: 2023-02-11 — End: 2023-02-11
  Administered 2023-02-11: 20 ug via INTRAVENOUS

## 2023-02-11 MED ORDER — SODIUM CHLORIDE 0.9 % IV SOLN
INTRAVENOUS | Status: DC | PRN
Start: 2023-02-11 — End: 2023-02-12

## 2023-02-11 MED ORDER — LACTATED RINGERS IV SOLN
INTRAVENOUS | Status: DC
Start: 2023-02-11 — End: 2023-02-11
  Administered 2023-02-11: 1000 mL via INTRAVENOUS

## 2023-02-11 MED ORDER — FENTANYL CITRATE (PF) 50 MCG/ML IJ SOLN (WRAP)
INTRAMUSCULAR | Status: DC | PRN
Start: 2023-02-11 — End: 2023-02-11
  Administered 2023-02-11: 100 ug via INTRAVENOUS

## 2023-02-11 MED ORDER — VASOPRESSIN 20 UNIT/ML IV SOLN (WRAP)
Status: AC
Start: 2023-02-11 — End: ?
  Filled 2023-02-11: qty 1

## 2023-02-11 SURGICAL SUPPLY — 71 items
BASIN EME PLS 700ML LF GRAD TRNLU PGMNT (Patient Supply)
BASIN EMESIS 700 ML GRADUATED TRANSLUCENT PIGMENT FREE PLASTIC (Patient Supply) IMPLANT
BLOCK BITE OD20 MM POLYETHYLENE ADULT (Patient Supply) ×1
BLOCK BITE OD20 MM POLYETHYLENE ADULT MOUTHPIECE STRAP RETENTION RIM (Patient Supply) ×1 IMPLANT
BRUSH ENDOSCOPIC CLEANING SLIM 2 BUNDLE (Endoscopic Supplies) ×1
BRUSH ENDOSCOPIC CLEANING SLIM 2 BUNDLE CATHETER ROBUST VALVE OD5 MM (Endoscopic Supplies) ×1 IMPLANT
CATHETER BALLOON DILATATION CRE 2.8 MM (Balloons)
CATHETER BALLOON DILATATION CRE PEBAX (Balloons)
CATHETER BALLOON DILATATION L5.5 CM L240 (Balloons)
CATHETER OD10-11-12 MM ODSEC6 FR L180 CM CREâ„¢ BALLOON DILATATION L8 CM (Balloons) IMPLANT
CATHETER OD18-19-20 MM ODSEC6 FR L180 CM CREâ„¢ BALLOON DILATATION L8 CM (Balloons) IMPLANT
CATHETER OD6 FR ODSEC12-13.5-15 MM L180 CM CREâ„¢ BALLOON DILATATION L8 (Balloons) IMPLANT
CATHETER OD6-7-8 MM ODSEC6 FR L180 CM CREâ„¢ BALLOON DILATATION L8 CM (Balloons) IMPLANT
CATHETER OD7 FR L300 CM BIPOLAR ROUND (Procedure Accessories)
CATHETER OD7 FR L300 CM BIPOLAR ROUND DISTAL TIP STANDARD CONNECTOR (Procedure Accessories) IMPLANT
CATHETER OD7 FR ODSEC25 GA ID.24 MM L210 (Catheter Micellaneous)
CATHETER OD7 FR ODSEC25 GA ID.24 MM L210 CM BIPOLAR ROUND DISTAL TIP (Catheter Miscellaneous) IMPLANT
CATHETER OD7.5 FR ODSEC12-15 MM L240 CM CREâ„¢ BALLOON DILATATION L5.5 (Balloons) IMPLANT
CATHETER OD7.5 FR ODSEC15-18 MM L240 CM CREâ„¢ BALLOON DILATATION L5.5 (Balloons) IMPLANT
CATHETER OD8-9-10 MM ODSEC6 FR L180 CM CREâ„¢ BALLOON DILATATION L8 CM (Balloons) IMPLANT
CATHETER OD8-9-10 MM ODSEC7.5 FR L240 CM CREâ„¢ BALLOON DILATATION L5.5 (Balloons) IMPLANT
CLIP HEMOSTATIC L235 CM OD2.8 MM BRAID (Clip) ×3 IMPLANT
CLIP HEMOSTATIC L235 CM OD2.8 MM BRAID CATHETER ROTATION CONTROL KNOB (Clip) ×3 IMPLANT
CONTAINER HISTOLOGY 60 ML 30 ML GRADUATE LEAK RESISTANT O RING PREFILL (Procedure Accessories) IMPLANT
DEVICE INFLATION CRE (Endoscopic Supplies)
DEVICE INFLATION CRE STERI-FLATE (Endoscopic Supplies) IMPLANT
DILATOR ENDOSCOPIC CRE 2.8 MM 3.2 MM (Balloons)
DILATOR ENDOSCOPIC CRE 2.8 MM 3.2 MM PEBAX ESOPHAGEAL PYLORIC COLONIC (Balloons) IMPLANT
DILATOR ENDOSCOPIC CRE 5.5C 240CM 18-19-20MM 7.5FR PEBAX ESOPHAGEAL (Balloons) IMPLANT
DILATOR ENDOSCOPIC CRE 5.5C 240CM 6-7-8MM 7.5FR PEBAX ESOPHAGEAL (Balloons) IMPLANT
DILATOR ENDOSCOPIC CRE PEBAX ESOPHAGEAL (Balloons)
ELECTRODE ADULT PATIENT RETURN L9 FT REM POLYHESIVE ACRYLIC FOAM (Procedure Accessories) IMPLANT
ELECTRODE PATIENT RETURN L9 FT VALLEYLAB (Procedure Accessories)
FORCEPS BIOPSY L240 CM +2.8 MM HOT OD2.2 (Endoscopic Supplies)
FORCEPS BIOPSY L240 CM +2.8 MM HOT OD2.2 MM RADIAL JAW (Endoscopic Supplies) IMPLANT
FORCEPS BIOPSY L240 CM JUMBO MICROMESH (Instrument)
FORCEPS BIOPSY L240 CM JUMBO MICROMESH TEETH STREAMLINE CATHETER (Instrument) IMPLANT
FORCEPS BIOPSY L240 CM LARGE CAPACITY (Instrument)
FORCEPS BIOPSY L240 CM MICROMESH TEETH STREAMLINE CATHETER NEEDLE (Instrument) ×1 IMPLANT
FORCEPS BIOPSY L240 CM STANDARD CAPACITY (Instrument) ×1
GOWN ISOLATION XL HOOK LOOP NECK KNIT (Patient Supply) ×2 IMPLANT
KIT ENDOSCOPIC COMPLIANCE ENDOKIT (Kits) ×1
KIT ENDOSCOPIC COMPLIANCE ENDOKIT ORCAPOD 4 1.1 OZ (Kits) ×1 IMPLANT
LIFTER BLUEBOOST 10ML SUBMUCOSAL SYRINGE (Endoscopic Supplies) IMPLANT
MANIFOLD SUCTION 2 STANDARD 4 PORT (Filter) ×1
MANIFOLD SUCTION 2 STANDARD 4 PORT NEPTUNE 2 WASTE MANAGEMENT SYSTEM (Filter) ×1 IMPLANT
MARKER ENDOSCOPIC PERMANENT INDICATION (Syringes, Needles)
MARKER ENDOSCOPIC PERMANENT INDICATION DARK SYRINGE SPOT EX 5 ML (Syringes, Needles) IMPLANT
NEEDLE SCLEROTHERAPY CARR-LOCKE OD25 GA ODSEC2.5 MM L230 CM INJECTION (Needles) IMPLANT
NEEDLE SCLEROTHERAPY OD25 GA ODSEC2.5 MM (Needles)
NET SPECIMEN RETRIEVAL L230 CM STANDARD (Urology Supply)
NET SPECIMEN RETRIEVAL L230 CM STANDARD SHEATH OD2.5 MM L6 CM X W3 CM (Urology Supply) IMPLANT
PAD TRANSPORT L42 IN X W17 IN (Procedure Accessories)
PAD TRANSPORT L42 IN X W17 IN TRANSLUCENT LEAK PROOF ABSORBENT (Procedure Accessories) IMPLANT
PROBE COAGULATION L7.2 FT (Endoscopic Supplies)
PROBE COAGULATION L7.2 FT CIRCUMFERENTIAL PLUG PLAY FUNCTIONALITY (Endoscopic Supplies) IMPLANT
PROBE ELECTROSURGICAL L220 CM FLEXIBLE (Procedure Accessories)
PROBE ELECTROSURGICAL L220 CM FLEXIBLE STRAIGHT FIRE OD2.3 MM FIAPC (Procedure Accessories) IMPLANT
SNARE 2.8 CM ROUND L240 CM OD10 MM (Endoscopic Supplies)
SNARE 2.8 CM ROUND L240 CM OD10 MM ODSEC2.4 MM CAPTIVATOR LOOP STIFF (Endoscopic Supplies) IMPLANT
SNARE 9 MM L230 CM OD2.4 MM COLD BRAID (Instrument)
SNARE 9 MM L230 CM OD2.4 MM EXACTO COLD BRAID WIRE CLEAN CUT (Instrument) IMPLANT
SNARE ESCP MIC CPTVTR 13MM 240IN STRL (GE Lab Supplies)
SNARE MD OVAL 240CM 2.4 MM CPTFLX LP FLXBL ENDOSCOPIC POLYPECTOMY 27MM (Endoscopic Supplies) IMPLANT
SNARE MEDIUM OVAL L240 CM OD2.4 MM (Endoscopic Supplies)
SNARE SMALL HEXAGON CAPTIVATOR STIFF ENDOSCOPIC POLYPECTOMY (GE Lab Supplies) IMPLANT
SOL FORMALIN 10% PREFILL 30ML (Procedure Accessories)
SYRINGE 50 ML GRADUATE NONPYROGENIC DEHP (Syringes, Needles) ×1
SYRINGE 50 ML GRADUATE NONPYROGENIC DEHP FREE PVC FREE BD MEDICAL (Syringes, Needles) ×1 IMPLANT
TUBING SUCTION ID3/16 IN L10 FT (Tubing) ×1
TUBING SUCTION ID3/16 IN L10 FT NONCONDUCTIVE STRAIGHT MALE FEMALE (Tubing) ×1 IMPLANT

## 2023-02-11 NOTE — PACU (Signed)
Blood administration rate changed to 200 mL/hr, verbal order by Dr Dudley Major

## 2023-02-11 NOTE — Progress Notes (Signed)
RN Shift Note and Trio Rounds template    Date Time: 02/11/23 12:19 AM  Patient Name: [name] [name], [name] [name]  Room No: Z6109/U0454.A  Admit Date: 02/10/2023  Length of Stay: 1  Attending Physician: Abram Sander, DO   Code Status:Full Code  Admit Status: Inpatient    Shift Note:  Orientation: AO X4  Rhythm on Tele: V paceed  Oxygen: Room air  Ambulation: 1 person with walker  Pain/VS: none  Lines/Drips/Foley: PIV x2  GI: golytely finished  GU: ext cath  Fall Score: Moderate  Psychosocial:   Plan:    Oncology consult, Bowel prep 1/2 finished, Blood sugar checked q4hrs,                1 unit PRBC given, NPO at 0900, for EGD and colonoscopy at 1030      Trio Rounds Template: X what is applicable in right hand column.     02/11/23    Overnight Events/ Updates: (Call to Physician, RRT, new symptoms, Changes to exam per RN)         Assessment:     Vital Signs: (abnormal and pain is the 5th vital sign)    Weight: (Trending, I&O's, Net, diuresing)                                     Cardiac: (Rhythm events/ changes, Short-term tele)    Respiratory: (increase or decreased O2 demands, O2 needed at home)    GI/GU: (urine or bowel concerns, dietary concerns, fluid or food intake concerns, NPO)               Patient has abnormal labs (K, Mg, hb, Cr, LFT's, Cultures resulted in last 24 hours)  Hgb- 6.9   Patient had Imaging resulted in 24 hours   No results found.        Activity Safety:     PT/OT, sitters                Patient/family have questions on plan of care                                                      Concerns about Mental Status and ADL's                                                           Psychosocial:           Patient/family have questions on plan of care/ discharge plans     Medication history updated        Unit Protocols:     Patient has Foley Catheter                                          Patient has [employer]  Fall risk                                                                         Patient has Pressure Ulcer or at Risk                         Glucose less than 100 and on insulin                        BS check q4hrs   Readmission in last 30 days      Patient transferred last night from another unit                                                 Jonetta Osgood, RN   12:19 AM   02/11/23

## 2023-02-11 NOTE — Plan of Care (Signed)
Problem: Moderate/High Fall Risk Score >5  Goal: Patient will remain free of falls  Outcome: Progressing  Flowsheets (Taken 02/10/2023 1600 by Penelope Galas, RN)  Moderate Risk (6-13):   MOD-Consider activation of bed alarm if appropriate   MOD-Apply bed exit alarm if patient is confused   MOD-Consider a move closer to Nurses Station   MOD-Floor mat at bedside (where available) if appropriate   MOD-Place bedside commode and assistive devices out of sight when not in use   MOD-Remain with patient during toileting   MOD-Re-orient confused patients   MOD-Perform dangle, stand, walk (DSW) prior to mobilization   MOD-Utilize diversion activities   MOD-Request PT/OT consult order for patients with gait/mobility impairment   MOD-Use gait belt when appropriate   MOD- Consider video monitoring     Problem: Compromised Sensory Perception  Goal: Sensory Perception Interventions  Outcome: Progressing  Flowsheets (Taken 02/11/2023 0820)  Sensory Perception Interventions: Offload heels, Pad bony prominences, Reposition q 2hrs/turn Clock, Q2 hour skin assessment under devices if present     Problem: Compromised Moisture  Goal: Moisture level Interventions  Outcome: Progressing  Flowsheets (Taken 02/11/2023 0820)  Moisture level Interventions: Moisture wicking products, Moisture barrier cream     Problem: Compromised Activity/Mobility  Goal: Activity/Mobility Interventions  Outcome: Progressing  Flowsheets (Taken 02/11/2023 0820)  Activity/Mobility Interventions: Pad bony prominences, TAP Seated positioning system when OOB, Promote PMP, Reposition q 2 hrs / turn clock, Offload heels     Problem: Compromised Nutrition  Goal: Nutrition Interventions  Outcome: Progressing  Flowsheets (Taken 02/11/2023 0820)  Nutrition Interventions: Discuss nutrition at Rounds, I&Os, Document % meal eaten, Daily weights     Problem: Compromised Friction/Shear  Goal: Friction and Shear Interventions  Outcome: Progressing  Flowsheets (Taken 02/11/2023  0820)  Friction and Shear Interventions: Pad bony prominences, Off load heels, HOB 30 degrees or less unless contraindicated, Consider: TAP seated positioning, Heel foams     Problem: Day of Admission - Heart Failure  Goal: Heart Failure Admission  Outcome: Progressing  Flowsheets (Taken 02/11/2023 1013)  Heart Failure Admission:   Standing Weight on admission, if unable to stand zero the bed and use the bed scale   Strict Intake/Output     Problem: Everyday - Heart Failure  Goal: Stable Vital Signs and Fluid Balance  Outcome: Progressing  Flowsheets (Taken 02/10/2023 1836 by Penelope Galas, RN)  Stable Vital Signs and Fluid Balance:   Daily Standing Weights in the morning using the same scale, after using the bathroom and before breadfast.  If unable to stand, zero the bed and use the bed scale   Monitor, assess vital signs and telemetry per policy   Fluid Restriction   Monitor labs and report abnormalities to physician   Assess for swelling/edema   Wean oxygen as needed if appropriate   Strict Intake/Output  Goal: Mobility/Activity is Maintained at Optimal Level for Patient  Outcome: Progressing  Goal: Nutritional Intake is Adequate  Outcome: Progressing  Flowsheets (Taken 02/10/2023 1836 by Penelope Galas, RN)  Nutritional Intake is Adequate:   Cardiac diet-2 gm Sodium   Fluid Restricction if needed   Consult/Collaborate with Nutritionist   Assess appetite,anorexia and amount of meal/food tolerated   Patient and family teaching on low sodium diet   Encourage/perform oral hygiene as appropriate  Goal: Teaching-Using CHF Warning Zones and Educational Videos  Outcome: Progressing

## 2023-02-11 NOTE — Anesthesia Postprocedure Evaluation (Addendum)
Anesthesia Post Evaluation    Patient: [name] [name]    Procedure(s):  EGD and clip placement    Anesthesia type: No value filed.    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  Strategies: awake extubation and verification of full NMB reversal    Anesthetic complications: No      PONV Status: none    Cardiovascular status: acceptable  Respiratory status: acceptable  Hydration status: acceptable  Comments: Pt is arousable but sleepy. BP systolics 85. HR in 80s. Sats 95. Receiving blood and stable.     Quality/Regulatory Reporting Exceptions  Medical reason(s) for not using multimodal pain management: no pain reported      Signed by: Rita Ohara, CRNA, 02/11/2023 12:18 PM

## 2023-02-11 NOTE — Nursing Progress Note (Signed)
1 unit PRBC administered & completed at 1447. No transfusion reaction, VSS.

## 2023-02-11 NOTE — Op Note (Signed)
Procedure Date: 02/11/2023    Patient Type: I    SURGEON: Tanji Storrs P. Jens Som, MD    PREOPERATIVE DIAGNOSIS:  Gastrointestinal hemorrhage.    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    TITLE OF PROCEDURE:  EGD with control of bleeding.    INDICATIONS FOR PROCEDURE:  GI hemorrhage.    MEDICATIONS:  TIVA.    DESCRIPTION OF PROCEDURE:  The patient was placed in left lateral decubitus position.  After adequate   sedation was achieved, an adult single channel therapeutic Olympus   gastroscope was introduced orally and advanced to the third portion of the   duodenum.    Upon withdrawal, there was adequate visualization of the third portion of   the duodenum, second portion of the duodenum, duodenal bulb, stomach and   esophagus.  Retroflex exam looking into the gastric fundus was also   performed.    FINDINGS:  There was active bleeding in the third portion of the duodenum.  Difficult   to isolate a source of bleeding given bright red blood pooling in the area of bleeding and significant   amount of duodenal peristalsis.      Due to the inability to visualize the   lesion, the gastroscope was removed from the patient.  Then, an Olympus   pediatric colonoscope was introduced with an EndoCuff and advanced to the   area.  Upon insertion, able to visualize the lesion that was bleeding.    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    Upon application of 3 EndoClips bleeding from this  vascular lesion stopped.  This area was visualized for a minimum of 5 minutes after application of endoclips without recurrent bleeding.    There were patches of erythema in the antrum with biopsies not taken.    Exams of the gastric body and fundus were unremarkable.    Exams of the proximal, mid and distal esophagus were unremarkable.    SC junction was  unremarkable.    POSTOPERATIVE DIAGNOSES:  1.  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  2.  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  3.  Instruments were changed from single channel therapeutic gastroscope to  pediatric colonoscope with EndoCuff as outlined above.    IMPRESSION AND PLAN:  1.  Gastrointestinal hemorrhage:  Due to the duodenal Dieulafoy lesion as   outlined above.  Status post 3 EndoClips with control of bleeding. Will   recommend for the patient to be started on a clear liquid diet.  If he   remains clinically stable overnight, will advance his diet as tolerated and  consider discharge for tomorrow.   2.  Antral gastropathy:  Biopsies were not taken as the patient was  experiencing active gastrointestinal bleeding.  Will consider repeat EGD as  an outpatient with biopsies.   3.  Personal history of colon polyps:  Plan was to perform colonoscopy   today.  The patient did not achieve adequate clean out with a split NuLytely   preparation.  We will arrange for colonoscopy as an outpatient and   recommend 2 day NuLytely preparation.      D: 02/11/2023 12:15 PM by Catheryn Bacon. Jens Som, MD [phone])  T: 02/11/2023 12:54 PM by  [id number] Everlean Cherry: [phone]) (Doc ID: [id number])

## 2023-02-11 NOTE — Transfer Summary (Addendum)
920: Pt transported to OR for EGD via pt bed. Pt on RA, VSS. Modified telemetry communication and notified telemetry monitor tech per protocol.     1438: Pt returned to room in via room bed, VSS, blood admin handoff performed with [facility] PACU RN. Pt drowsy but arousable.

## 2023-02-11 NOTE — Progress Notes (Signed)
[place] [facility] CARDIOLOG[facility] PROGRESS NOTE    [facility]  [facility] [address] PROGRESSIVE CARE  [address]  [place] [address]  Loc: [phone]    Date Time: 02/11/23 10:32 AM  Patient Name: [facility] Day: 1      Subjective/Cardiac Relevant Events:   Denies dyspnea.  Tired from busy day  No significant cardiac events    ASSESSMENT:   Patient is a 77 y.o. male with the following cardiology relevant diagnoses.       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  Class 4 on admission now 3.  GDMT limited by renal function    Echo 12/21/22:  LVEF 11%, severe MR, severe TR, PHTN 51  Acute anemia with Hgb 6.3 (Hgb last month 10.5) due to GIB  Nonspecific hsTrop abnormality NOT indicative of ACS.  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  Medical therapy limited by hypotension.  CHF nursing protocol with daily standing weights and strict monitoring of in/out fluid balance.  Low salt diet.  Daily monitoring of electrolyte levels with replacement of potassium to maintain levels 4-5 mEq/L and magnesium 2-3 mg/dL.  Fluid restriction 1.5-2L/24hrs  ACEIARB/ARNI: Consider if BP and renal functio nallows  BB: Toprol XL  MRA Antagonist: Not recommended due to poor renal function  SGLT2i: Plan to start  Diuretic: Lasix 80mg  IV BID.  May need more as he has been transfused PRBC  ACHF is not available today.  Not sure why patient is on aspirin or amiodarone since he has nonischemic cardiomyopathy and no AFIB.  This is not clear from quick chart review and CHF note 4/24.  Would continue amiodarone for now since rhythm is stable but  need to clarify indication.  Aspirin held for anemia but not sure why this is prescribed as he has no CAD by cath      Physical Exam:   Patient  height is 1.829 m (6') and weight is 88.5 kg (195 lb). His temporal temperature is 97.3 F (36.3 C). His blood pressure is 112/73 and his pulse is 74. His respiration is 20 and oxygen saturation is 100%.       01/01/2023 01/02/2023 01/03/2023 01/04/2023 01/08/2023 02/10/2023 02/11/2023   Weight Monitoring   Height     182.9 cm 182.9 cm    182.9 cm    182.9 cm 182.9 cm   Height Method      Stated Actual   Weight 84.142 kg 84.369 kg 84.006 kg 84.732 kg 84.369 kg    84.369 kg 84.4 kg    84.4 kg    84.369 kg 88.451 kg   Weight Method Standing Scale Standing Scale Standing Scale Standing Scale  Bed Scale Actual    Standing Scale   BMI (calculated) 25.2 kg/m2 25.2 kg/m2 25.1 kg/m2 25.3 kg/m2 25.2 kg/m2    25.2 kg/m2 25.2 kg/m2    25.2 kg/m2    25.2 kg/m2 26.4 kg/m2       Constitutional: Cooperative, alert, no acute distress.  Fatigued  Neck: JVP normal.  Cardiac: Regular rate and rhythm, systolic murmur  Pulmonary: Basilar crackles  Extremities: no edema.  Vascular: +2 pulses in radial artery bilaterally    Telemetry/Laboratory/Intake-Output Reviewed:     Telemetry reviewed: No concerning events/alarms.     Recent Labs     02/11/23  0839 02/10/23  1946   Sodium 141  --    Potassium 4.0  --    Chloride 105  --    CO2 25  --    BUN 46.0*  --    Creatinine 1.9*  --    Calcium 8.9  --    WBC 9.50  --    Hgb 7.7* 6.9*   Hematocrit 25.2* 22.6*   Platelets 107*  --    hs Troponin-I  --  39.1*   hs Troponin-I Delta  --  calc n/a   NT-proBNP  --  5,600*   AST (SGOT) 22  --    ALT 24  --          Intake/Output Summary (Last 24 hours) at 02/11/2023 1032  Last data filed at 02/11/2023 0900  Gross per 24 hour   Intake 5777.25 ml   Output 3110 ml   Net 2667.25 ml       Medications:      Scheduled Meds: PRN Meds:    amiodarone, 200 mg, Oral, Daily  atorvastatin, 40 mg, Oral, QHS  furosemide, 80 mg,  Intravenous, BID  hydrALAZINE, 100 mg, Oral, Q8H SCH  insulin glargine, 15 Units, Subcutaneous, QAM  insulin lispro, 1-5 Units, Subcutaneous, Q4H SCH  isosorbide dinitrate, 40 mg, Oral, TID - Nitrate Free Interval  metoprolol succinate XL, 25 mg, Oral, Daily  niacin, 500 mg, Oral, BID  pantoprazole, 40 mg, Intravenous, BID  sodium phosphate, 1 enema, Rectal, Once        Continuous Infusions:   lactated ringers 1,000 mL (02/11/23 0957)    sodium chloride, , PRN  sodium chloride, , PRN  acetaminophen, 650 mg, TID PRN  benzocaine-menthol, 1 lozenge, Q2H PRN  benzonatate, 100 mg, TID PRN  carboxymethylcellulose sodium, 1 drop, TID PRN  dextrose, 15 g of glucose, PRN   Or  dextrose, 12.5 g, PRN   Or  dextrose, 12.5 g, PRN   Or  glucagon (rDNA), 1 mg, PRN  naloxone, 0.2 mg, PRN  ondansetron, 4 mg, Q6H PRN   Or  ondansetron, 4 mg, Q6H PRN  saline, 2 spray, Q4H PRN            -------------------------------------------------------------------------------------      Signed by:     Sherlean Foot, MD  [facility]    [place] [facility] Contact Information   [facility]  Secure Chat (Group):   [facility] [place] [facility]    APP Spectralink:  [phone]    MD Spectralink :  [phone]  [phone]    After hours, non urgent consult line:  [phone]    After hours, physician on-call:  [phone] [facility]  Secure Chat (Group):   [facility] [place] [facility]    APP Spectralink:  [phone]    MD Spectralink :  [phone]      After hours, non urgent consult line:  [phone]    After hours, physician on-call:  [phone] [facility]  Secure Chat (Group):   [facility] [place] [facility]    APP Spectralink:  [phone]    MD Spectralink :  [phone]      After hours, non urgent consult line:  [phone]    After hours, physician on-call:  [phone] [facility]  Secure Chat (Group):   [facility] [place]  [facility]    APP Spectralink:  [phone]    MD Spectralink :  [phone]      After hours, non urgent consult line:  [phone]    After  hours, physician on-call:  [phone]       This note was generated by the Dragon speech recognition and may contain errors or omissions not intended by the user. Grammatical errors, random word insertions, deletions, pronoun errors, and incomplete sentences are occasional consequences of this technology due to software limitations. Not all errors are caught or corrected. If there are questions or concerns about the content of this note or information contained within the body of this dictation, they should be addressed directly with the author for clarification.

## 2023-02-11 NOTE — Plan of Care (Signed)
Problem: Everyday - Heart Failure  Goal: Stable Vital Signs and Fluid Balance  02/11/2023 2216 by Clotilde Dieter, RN  Outcome: Progressing  Flowsheets (Taken 02/11/2023 2216)  Stable Vital Signs and Fluid Balance:   Assess for swelling/edema   Fluid Restriction   Monitor, assess vital signs and telemetry per policy   Daily Standing Weights in the morning using the same scale, after using the bathroom and before breadfast.  If unable to stand, zero the bed and use the bed scale   Monitor labs and report abnormalities to physician   Wean oxygen as needed if appropriate   Strict Intake/Output  02/11/2023 2215 by Clotilde Dieter, RN  Outcome: Progressing  Goal: Mobility/Activity is Maintained at Optimal Level for Patient  02/11/2023 2216 by Clotilde Dieter, RN  Outcome: Progressing  Flowsheets (Taken 02/11/2023 2216)  Mobility/Activity is Maintained at Optimal Level for Patient:   Increase mobility as tolerated/progressive mobility protocol   Reposition patient every 2 hours and as needed unless able to reposition self   Assess for changes in respiratory status, level of consciousness and/or development of fatigue   Consult with Physical Therapy and/or Occupational Therapy   Perform active/passive ROM  02/11/2023 2215 by Clotilde Dieter, RN  Outcome: Progressing     Problem: Altered GI Function  Goal: Fluid and electrolyte balance are achieved/maintained  02/11/2023 2216 by Clotilde Dieter, RN  Outcome: Progressing  Flowsheets (Taken 02/11/2023 2216)  Fluid and electrolyte balance are achieved/maintained:   Monitor for muscle weakness   Monitor/assess lab values and report abnormal values   Assess and reassess fluid and electrolyte status   Observe for cardiac arrhythmias  02/11/2023 2215 by Clotilde Dieter, RN  Outcome: Progressing     Problem: Safety  Goal: Patient will be free from injury during hospitalization  Outcome: Progressing  Flowsheets (Taken 02/11/2023 2216)  Patient will be free from injury during hospitalization:    Provide alternative method of communication if needed (communication boards, writing)   Assess for patients risk for elopement and implement Elopement Risk Plan per policy   Include patient/ family/ care giver in decisions related to safety   Use appropriate transfer methods   Assess patient's risk for falls and implement fall prevention plan of care per policy   Ensure appropriate safety devices are available at the bedside   Hourly rounding   Provide and maintain safe environment

## 2023-02-11 NOTE — Nursing Progress Note (Signed)
RN Shift Note and Trio Rounds template    Date Time: 02/11/23 6:04 PM  Patient Name: [name] [name], [name] [name]  Room No: J4782/N5621.A  Admit Date: 02/10/2023  Length of Stay: 1  Attending Physician: Kavin Leech, MD   Code Status:Full Code  Admit Status: Inpatient    Shift Note:  Orientation:  Rhythm on Tele: Ventricular paced  Oxygen:  Ambulation: 1x assist w/walker  Pain/VS: Hypotension after EGD, VSS since transferring back to PCU. 1u PRBC administered during return to PCU, 3 total since admission  Lines/Drips/Foley: excath.  GI: Intermittent incontinence, otherwise pivot to [facility]  GU: ex cath, adequate UOP  Fall Score: moderate  Psychosocial:   Plan: Monitor H&H and Bms, Advance diet, D/c tomorrow most likely           Trio Rounds Template: X what is applicable in right hand column.     02/11/23    Overnight Events/ Updates: (Call to Physician, RRT, new symptoms, Changes to exam per RN)         Assessment:     Vital Signs: (abnormal and pain is the 5th vital sign)    Weight: (Trending, I&O's, Net, diuresing)                                     Cardiac: (Rhythm events/ changes, Short-term tele)    Respiratory: (increase or decreased O2 demands, O2 needed at home)    GI/GU: (urine or bowel concerns, dietary concerns, fluid or food intake concerns, NPO)        Clear liq diet atm       Patient has abnormal labs (K, Mg, hb, Cr, LFT's, Cultures resulted in last 24 hours)     Patient had Imaging resulted in 24 hours   No results found.        Activity Safety:     PT/OT, sitters                Patient/family have questions on plan of care                                                      Concerns about Mental Status and ADL's                                                           Psychosocial:           Patient/family have questions on plan of care/ discharge plans     Medication history updated        Unit Protocols:     Patient has Foley Catheter                                       Ex cath   Patient has [employer]  Fall risk                                                                        Patient has Pressure Ulcer or at Risk                         Glucose less than 100 and on insulin                           Readmission in last 30 days      Patient transferred last night from another unit                                                 Satira Mccallum, RN   6:04 PM   02/11/23

## 2023-02-11 NOTE — PACU (Signed)
Called Dr Dudley Major to notify of pt's consistent hypotension; coming to assess patient

## 2023-02-11 NOTE — Progress Notes (Signed)
RN Shift Note and Trio Rounds template    Date Time: 02/11/23 10:18 PM  Patient Name: KI, CORBO  Room No: Z6109/U0454.A  Admit Date: 02/10/2023  Length of Stay: 1  Attending Physician: Kavin Leech, MD   Code Status:Full Code  Admit Status: Inpatient    Shift Note:  Orientation: drowsy but alert and oriented x 4  Rhythm on Tele: Vpaced  Oxygen:  Ambulation: one person assist to bedside commode  Pain/VS:  Lines/Drips/Foley:  GI: dark brown loose BM  GU: external cath  Fall Score:  Psychosocial:  Plan:    clear liquid diet, monitor Hgb/Hct 8.5, 27.4, CHF pathway      Trio Rounds Template: X what is applicable in right hand column.     02/11/23    Overnight Events/ Updates: (Call to Physician, RRT, new symptoms, Changes to exam per RN)         Assessment:     Vital Signs: (abnormal and pain is the 5th vital sign)    Weight: (Trending, I&O's, Net, diuresing)                                     Cardiac: (Rhythm events/ changes, Short-term tele)    Respiratory: (increase or decreased O2 demands, O2 needed at home)    GI/GU: (urine or bowel concerns, dietary concerns, fluid or food intake concerns, NPO)               Patient has abnormal labs (K, Mg, hb, Cr, LFT's, Cultures resulted in last 24 hours)     Patient had Imaging resulted in 24 hours   No results found.        Activity Safety:     PT/OT, sitters                Patient/family have questions on plan of care                                                      Concerns about Mental Status and ADL's                                                           Psychosocial:           Patient/family have questions on plan of care/ discharge plans     Medication history updated        Unit Protocols:     Patient has Foley Catheter                                          Patient has [address]                                           Fall risk  Patient has Pressure Ulcer or at Risk                          Glucose less than 100 and on insulin                           Readmission in last 30 days      Patient transferred last night from another unit                                                 Clotilde Dieter, RN   10:18 PM   02/11/23

## 2023-02-11 NOTE — Progress Notes (Addendum)
[facility]PITAL[facility] Progress Note  Patient Info:   Date/Time: 02/11/2023 / 6:10 PM   Admit Date:02/10/2023  Patient Name:[name] [name]   KVQ:[id number]   PCP: Zorita Pang, MD  Attending Physician:Tanner Vigna, Thomasene Lot, MD  Assessment/Plan:   77 year old man with HTN, NICM, systolic heart failure, s/p ICD, CKD stage IV, DVT on Eliquis, CLL, presented to oncologist for routine visit and found to have severe anemia.  Hemoglobin 6 mg/dL.  Patient reported black stool.  Admitted for GI bleed.    Acute blood loss anemia secondary to duodenal bleed  S/p 2 units of PRBC.  Last hemoglobin 7.7 mg/dL per  EGD by Dr. 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  Avoid NSAIDs.  Hold Eliquis for now.  Timing of resumption of anticoagulation deferred to GI  Continue to monitor H&H and transfuse to keep hemoglobin above 7 mg/dL    Acute thrombocytopenia  Likely consumptive loss from duodenal bleed  PLT 109, down from 130  Continue to monitor daily CBC    Acute on chronic systolic heart failure  S/p ICD for nonischemic cardiomyopathy  Mild/trace edema and LLE  Clinical symptoms euvolemic  Caution with IV fluid  Continue Lasix 80 mg IV twice daily  Followed by [facility]    CKD stage IV  Creatinine stable 1.9.  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 Laurine Blazer    History of DVT  Eliquis on hold    Essential hypertension  Continue metoprolol, Isordil, hydralazine, amiodarone        DVT Prohylaxis:contraindicated  active bleeding  and SEDs   GI Prophylaxis: Protonix  Central Line/Foley Catheter/PICC line status: None  Code Status: Full Code  Disposition: Home  Type of Admission:Inpatient  Expected Date of Discharge: 24-48 hours  Milestones required for discharge: Clinical improvement; H&H remained stable; cleared by GI    Hospital Problems:     Active Hospital Problems     Diagnosis    GI bleed       Subjective:   Chief Complaint:  Abnormal Lab    02/11/23   Review of Systems   Constitutional: Negative.    HENT: Negative.     Eyes: Negative.    Respiratory: Negative.     Cardiovascular:  Positive for leg swelling.   Gastrointestinal: Negative.    Genitourinary: Negative.    Neurological: Negative.         Generalized weakness, much improved today   Psychiatric/Behavioral: Negative.       Objective:     Vitals:    02/11/23 1500 02/11/23 1520 02/11/23 1600 02/11/23 1715   BP: 95/70 97/66 97/70  104/69   Pulse: 69 65 74 73   Resp:    17   Temp:    97.2 F (36.2 C)   TempSrc:    Temporal   SpO2:  97% 99% 100%   Weight:       Height:         Physical Exam:   Physical Exam  HENT:      Head: Normocephalic and atraumatic.      Nose: Nose normal.      Mouth/Throat:      Mouth: Mucous membranes are moist.      Pharynx: Oropharynx is clear.   Eyes:      Extraocular Movements: Extraocular  movements intact.      Conjunctiva/sclera: Conjunctivae normal.      Pupils: Pupils are equal, round, and reactive to light.   Cardiovascular:      Pulses: Normal pulses.      Heart sounds: Murmur heard.      Comments: S1, S2 normal  Pulmonary:      Effort: Pulmonary effort is normal.   Abdominal:      General: Bowel sounds are normal.      Palpations: Abdomen is soft.   Musculoskeletal:      Cervical back: Neck supple.      Right lower leg: No edema.      Left lower leg: Edema present.   Skin:     General: Skin is dry.      Capillary Refill: Capillary refill takes less than 2 seconds.   Neurological:      General: No focal deficit present.      Mental Status: He is alert and oriented to person, place, and time.   Psychiatric:         Mood and Affect: Mood normal.         Behavior: Behavior normal.         Thought Content: Thought content normal.         Judgment: Judgment normal.       Current Medications      Scheduled Meds: PRN Meds:    amiodarone, 200 mg, Oral, Daily  atorvastatin, 40 mg, Oral,  QHS  furosemide, 80 mg, Intravenous, BID  hydrALAZINE, 100 mg, Oral, Q8H SCH  insulin glargine, 15 Units, Subcutaneous, QAM  insulin lispro, 1-5 Units, Subcutaneous, Q4H SCH  isosorbide dinitrate, 40 mg, Oral, TID - Nitrate Free Interval  metoprolol succinate XL, 25 mg, Oral, Daily  niacin, 500 mg, Oral, BID  pantoprazole, 40 mg, Intravenous, BID  sodium phosphate, 1 enema, Rectal, Once        Continuous Infusions:   sodium chloride, , PRN  sodium chloride, , PRN  acetaminophen, 650 mg, TID PRN  benzocaine-menthol, 1 lozenge, Q2H PRN  benzonatate, 100 mg, TID PRN  carboxymethylcellulose sodium, 1 drop, TID PRN  dextrose, 15 g of glucose, PRN   Or  dextrose, 12.5 g, PRN   Or  dextrose, 12.5 g, PRN   Or  glucagon (rDNA), 1 mg, PRN  naloxone, 0.2 mg, PRN  ondansetron, 4 mg, Q6H PRN   Or  ondansetron, 4 mg, Q6H PRN  saline, 2 spray, Q4H PRN          Results of Labs/imaging   Labs and radiology reports have been reviewed.    Hospitalist   Signed by:   Kavin Leech, MD  02/11/2023 6:10 PM

## 2023-02-11 NOTE — Plan of Care (Signed)
[facility]    Pt off floor     Hematology history  Pt is followed by Dr Laurine Blazer with a history of CLL  He is on acalabrutinib since July 2023    Pt was seen in our office 4/24 and sent to ED with Hgb 6.1  Prior Hgb 4/10 = 9.4, Dec 2023 =12.9    Pt undergoing work up for GIB  Iron studies pending    Hold acalabrutinib while undergoing work up    Rounding with Dr Hulen Skains  Please reach out w questions    Terrance Mass, FNP-BC  VCS NP

## 2023-02-11 NOTE — Nursing Progress Note (Signed)
Hand-off/verification of Blood completed with Loney Loh, PACU RN at bedside.

## 2023-02-11 NOTE — Consults (Signed)
Initial Case Management Assessment and Discharge Planning  [facility]   Patient Name: [name] [name], [name] [name]   Date of Birth [birth year]   Attending Physician: Kavin Leech, MD   Primary Care Physician: Zorita Pang, MD   Length of Stay 1   Reason for Consult / Chief Complaint 1. GI bleed    2. Acute blood loss anemia    3. Anticoagulated by anticoagulation treatment    4. Personal history of CLL (chronic lymphocytic leukemia)    5. History of CHF (congestive heart failure)    6. Elevated troponin    7. Elevated brain natriuretic peptide (BNP) level    8. Chronic kidney disease, unspecified CKD stage            PCP: Zorita Pang, MD    Preferred Pharmacy @ D/C:      [facility] [phone] - [place], [place] - [address] AT [facility] [address]  [address]  [place] [address]  Phone: [phone] Fax: [phone]    [facility] #[id number] [place], [place] - [phone] AT [facility] [address]  [phone]  [place] [address]  Phone: [phone] Fax: [phone]    CM spoke with patient's wife Lajoyce Corners via phone to complete initial discharge planning assessment. Demographics on face sheet were verified.    Healthcare Agent:   spouse Zach Tietje    Living Arrangements: Patient lives with his wife in a one level home, no steps to enter     Prior level of functioning:    - ADL's: independent    - Ambulation: independent    - Driving: independent short distances or wife transports as needed    DME: blood pressure monitor    Home or community services: Active with [facility]     Past/present rehab: Yes [facility]    Support systems: Wife is primary support     Social/financial barriers: none reported     Discharge Transportation: wife to transport     D/C Plan A: Home with family and Home with home health [facility])    D/C Plan B: TBD pending continued progress during hospital stay     BARRIERS TO  DISCHARGE:  [facility]     Kaytie Ratcliffe, [employer]  Social Work Case Manager  [facility]

## 2023-02-11 NOTE — Brief Op Note (Signed)
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  Antral gastritis with no biopsies taken.    Anesthesia:   No value filed.    Estimated Blood Loss:    * No values recorded between 02/11/2023 11:13 AM and 02/11/2023 12:00 PM *    Implants:     Implant Name Type Inv. Item Serial No. Model No. Manufacturer Lot No. LRB No. Used Action   CLIP HEMOSTATIC L235 CM OD2.8 MM BRAID - [id number] Clip CLIP HEMOSTATIC L235 CM OD2.8 MM BRAID [id number] [employer] [id number]  1 Implanted   CLIP HEMOSTATIC L235 CM OD2.8 MM BRAID - [id number] Clip CLIP HEMOSTATIC L235 CM OD2.8 MM BRAID [id number] [employer] [id number]  1 Implanted   CLIP HEMOSTATIC L235 CM OD2.8 MM BRAID - [id number] Clip CLIP HEMOSTATIC L235 CM OD2.8 MM BRAID [id number] [employer] [id number]  1 Implanted           Drains:   Drains: no            Specimens:     ID Type Source Tests Collected by Time Destination   A :  No Specimen Required No Specimen NO PATHOLOGY Doyne Keel, MD 02/11/2023 1158           Complications:   None     Signed by: Doyne Keel, MD                                                                           [facility]

## 2023-02-11 NOTE — UM Notes (Signed)
Patient Name: [name] [name]   Patient DOB: [birth year]    Reference Auth # SUB# [id number]  Review for DOS: Admission clinicals for 02/10/23-02/11/23    C/b to Joanmarie Tsang [phone]  [facility] Utilization Review   NPI #: [id number]   Tax ID#  [id number]  Department Phone#: [phone]  Please fax final authorization and requests for additional information to [phone]      02/10/23 1311  ADMIT TO INPATIENT  Once        Diagnosis: Gi Bleed  Level of Care: Step Down/Prog Care [facility], [facility], [facility] ONLY  Patient Class: Inpatient   References:    [facility] Bed Placement Criteria    [facility] Bed Placement Criteria    [facility] Bed Placement Criteria    [facility] Bed Placement Criteria    [facility] Bed Placement Criteria    New Service Definitions Feb 2023   Question Answer Comment   Admitting Physician MALIVUK, MATTHEW    Service: Medicine    Estimated Length of Stay > or = to 2 midnights    Tentative Discharge Plan? Home or Self Care                DIAGNOSIS    ICD-10-CM    1. GI bleed  K92.2       2. Acute blood loss anemia  D62       3. Anticoagulated by anticoagulation treatment  Z79.01       4. Personal history of CLL (chronic lymphocytic leukemia)  Z85.6       5. History of CHF (congestive heart failure)  Z86.79       6. Elevated troponin  R79.89       7. Elevated brain natriuretic peptide (BNP) level  R79.89       8. Chronic kidney disease, unspecified CKD stage  N18.9       9. Anemia, unspecified type  D64.9 No Pathology     No Pathology            PRESENTED TO ED: 02/10/2023 at 1138     Reason For Hospitalization:    Pt is a 77 y.o. male who arrived to the ER with C/OAbnormal Lab    On EXAM : Hemoccult positive black stool / JVP is elevated / 2+ bilar pitting edema LEs    PMH:  has a past medical history of Acute CHF, Acute systolic (congestive) heart failure (08/2017), Anxiety, CAD (coronary artery disease), Cardiomyopathy, CHF (congestive heart failure) (08/12/2014, 2013), Chronic obstructive pulmonary disease, CLL  (chronic lymphocytic leukemia) (10/26/2022), Coronary artery disease (2011), Depression, echocardiogram (02/2016, 11/2016, 09/2017, 12/20/2017), GERD (gastroesophageal reflux disease), Heart attack (2011), Hyperlipemia, Hypertension, ICD (implantable 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, Ischemic cardiomyopathy, Nonischemic cardiomyopathy, NSTEMI (non-ST elevated myocardial infarction), 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    Vitals:  97.9, HR 80, RR 24, BP 94/52-94/64-94/65, sats 95% RA    ED Abnormal Labs:  H&H 6.3/20.7 (from 10.5/31.9 1 mo ago), Platelets 130, RBC 1.80, Glucose 360, BUN 62, Cr 2.6, ALB 3.3, NT-proBNP 5990, hs Trop 43.5, PTINR 22.1/1.9    Radiologic Exams:   No results found.       EKG:  AV dual-paced rhythm   ABNORMAL ECG   WHEN COMPARED WITH ECG OF 21-Dec-2022 23:21,   VENTRICULAR RATE HAS DECREASED by  17 bpm     ED meds: 40mg  IV Protonix    Transfer to PCU  as Inpatient. IP admit order 02/10/23 1311.  ADMIT : Acute GB bleed with blood loss anemia with Hgb <7 - (H&H 6.3/20.7 from 10.5/31.9 1 mo ago) on blood thinners requiring transfusion with elevated troponin and BNP       MD H&P 02/10/23 :  # Acute macrocytic anemia secondary to acute GI bleed  -- h/o IDA  - INR: 1.9, PT: 22 (02/10/23)  - hemoglobin: 6.3 (02/10/23) down from 10.5 on 01/04/2023  - platelets: 130 (02/10/23)  - mean corpuscular volume: 115 (02/10/23)  - vitamin B12: 2000, folate: 12.6 (12/24/22)  - est. baseline hemoglobin: 10.9 g/dL  - Occult stool positive; with dark black/green stool   Plan  - currently on: pantoprazole 40 mg iv daily  - holding home: aspirin and Eliquis  - type and screen and blood consent obtained in the emergency room  - patient recieving 1 unit PRBC in ER  - will need diuresis after given hisotry of CHF/cardiomyopathy: [facility] cardiology to make recommendations    - continue to trend H&H  - hematology consult (Dr. Laurine Blazer sent patient to the hospital)  - continue to transfuse to keep hemoglobin > 7  - Gastroentology consult for GI bleed -- Dr. Jens Som has agreed to consultation  - Keep NPO  - IV Protonix     # Troponin elevation   -Patient does not have any cardiac symptoms at this time  - ECG: "AV dual-paced rhythm ABNORMAL ECG WHEN COMPARED WITH ECG OF 21-Dec-2022 23:21, VENTRICULAR RATE HAS DECREASED by 17 bpm Narrative: AV dual-paced rhythm ABNORMAL ECG WHEN COMPARED WITH ECG OF 21-Dec-2022 23:21, VENTRICULAR RATE HAS DECREASED by 17 bpm" (02/10/23)  - troponin I high sensitivity: 44 (02/10/23 11:31)  Plan:  - continue to trend troponin  - monitor on telemetry  - cardiac meds as ordered  - [facility] consultation pending    Cardiac:  # Congestive heart failure: chronic, systolic and diastolic  Non-ischemic Cardiomyopathy, 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 baseline 84 kg  -- pro BNP 5990  - prior admission 01/04/23 required bumex gtt and IV diuril; also was on a dobutamine gtt  -GDMT limited by renal dysfunction (no ACEI/ARB/MRA/SGLT2i), no beta blocker 2/2 cardiogenic shock; continue hydralazine and isordil  - not on O2  Plan  - -Continue home: isosorbide and metoprolol  -Patient is on torsemide 80 mg p.o. twice daily, took a.m. dose; as above [facility] cardiology to make recommendations regarding continued diuresis recommendations after blood products  - continue fluid restriction and low salt diet  -Strict I's and O's     # Chronic hypertension with noted hypotension in the emergency room  - 24hr BP range: (94-109) / (52-69) (02/10/23)  -Patient has taken his home medications including Imdur, hydralazine and metoprolol  today  -Will hold afternoon dose of hydralazine and see how pressures are after he received his blood transfusion  -Rest of his home medications are ordered with hold parameters  -[facility] cardiology to offer input regarding medication regimen     # Hyperlipidemia: with clinical ASCVD  - lipid panel: total cholesterol: 106 / calculated LDL: 63 / HDL: 28 / triglycerides: 76 / calculated VLDL: 15 (12/21/22)  - home: atorvastatin and niacin continued     #  Coronary artery disease  -Aspirin on hold given GI bleed  -Continuing home: Imdur and beta-blocker      # history of Narrow complex supraventricular tachycardia, s/p unsuccessful cardioversion 12/21/22  - continue amiodarone 200mg  po daily (taken this AM)  - tele monitoring     Hematology/oncology:  # Chronic lymphocytic leukemia, stage IV  diagnosed 06/2020  -started acalabrutinib (Calquence) in July 2023  -Seen by oncology ([facility]) last hospitalization and his Calquence has been on hold --we will continue to hold this medication as it does have an anticoagulant effect  -Follow-up recommendations per Dr. 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 Renaee Munda, who has not been consulted at this time.  Defer to medical attending to determine consultation depending on creatinine     Endo:  # Diabetes Mellitus, Type 2: uncontrolled  - glucose: 360 (02/10/23)  - hemoglobin A1c: 7.7 % (12/21/22) up from 5.9 % (08/06/22)  - home regimen: - insulin glargine 100 UNIT/ML injection; Inject 33 Units into the skin every 12 (twelve) hours and Lantus 10 units 3 times daily  -For now the patient is n.p.o., we will restart long-acting at 50% and low  insulin immediate acting sliding scale every 4 hours and continue adjust based off of blood glucose trends     Psych: No active issues  # Depression and Anxiety: recurrent  - not on medications     Respiratory: No active issues  # COPD, no acute exacerbation; reports Breo PRN at home (no active home med for this)       Consults:     Cardio consult 02/10/23 :  ASSESSMENT:   Patient is a 77 y.o. male with the following relevant diagnoses:     Acute anemia with Hgb 6.3 (Hgb last month 10.5)  Mild acute on chronic systolic heart failure with JVD and mild edema  nonischemic Cardiomyopathy, EF 21%, NYHA Class 4 at admission, ACC/AHA Stage D, LVIDd 6.9 cm, Medtronic BiV-IC   Negative cardiac catheterization for coronary disease 2014  HTN  Medtronic ICD in place  Diabetes  Acute right proximal DVT now on eliquis   Leukocytosis secondary to CLL   CKD stage 3, follows Dr. Renaee Munda  Creat 2.6 which is down from 4 in 12/2022  GDMT limited by renal function       RECOMMENDATIONS:      Pt took his torsemide 80 mg this am.  Change to lasix 80 mg IV BID for now (will get 80 now and currently receiving PRBC)  Any additional PRBC transfusion I would follow with lasix 40 mg IV x1  Mild edema and JVD so will use IV lasix (may need PRN metolazone) to optimize while here.  Daily standing weights please with strict I and O's      GI consult 02/10/23 :  Severe anemia and Hemoccult positive stool: Patient has a history of duodenal ulcer requiring surgical intervention in 1972.  Was diagnosed with gastritis and H. pylori infection at the time of EGD in 2019 for which she was given treatment.  Patient  has been on aspirin which would increase his risk for gastritis, gastric ulcer, duodenitis and/or duodenal ulceration.     I am recommending an EGD to assess for the above gastric and duodenal disorders.  Rationale for the procedure as well as details and risks including reaction to sedation, bleeding and perforation requiring surgery were explained  to the patient.  Patient verbalized an understanding and provided informed consent to proceed with EGD.     Patient has a history of multiple adenomatous colon polyps.  Had a 2.2 cm adenomatous polyp resected in 2019 for which the patient did not proceed with recommended follow-up.  Patient may have developed a metachronous colon or rectal polyp or neoplasm that may be contributing to severe anemia and Hemoccult positive stool.  I am recommending a colonoscopy to assess for colon polyps, colon neoplasm, segmental inflammation and vascular malformation.  Rationale for the procedure as well as details and risks including reaction to sedation, bleeding and perforation requiring surgery were explained to the patient.  Patient verbalized an understanding and provided informed consent to proceed with colonoscopy.     Agree with holding aspirin, Eliquis and any other anticoagulants.  In addition, blood transfusion.     History of myocardial infarction, ischemic cardiomyopathy status post pacemaker and defibrillator: I spoke with his cardiologist, Dr. Charm Barges, regarding his current cardiac status and optimal timing for EGD and colonoscopy.  Dr. Charm Barges approved for the patient to proceed with EGD and colonoscopy for tomorrow.     Duration of encounter, review of records and coordination of care: These exceeded 80 minutes.     PT/INR elevation: Due to Eliquis.  This is being held.  Will repeat PT/INR tomorrow morning.      Testing/Procedures:    UA + glucose 300    Treatment Plan:     PCU, tele monitor, NPO 02/11/23 0900, external urinary catheter, GI consult, ONC consult, Cardio consult, supp O2, VS q4, daily weights, I/O, supp O2, fall prec, hold Eliquis, bowel prep this pm - po GoLytely    TRANSFUSE 1unit PRBC xs 2    Current Facility-Administered Medications   Medication Dose Route Frequency    amiodarone  200 mg Oral Daily    atorvastatin  40 mg Oral QHS    furosemide  80 mg Intravenous BID    hydrALAZINE  100 mg  Oral Q8H SCH    insulin glargine  15 Units Subcutaneous QAM    insulin lispro  1-5 Units Subcutaneous Q4H SCH    isosorbide dinitrate  40 mg Oral TID - Nitrate Free Interval    metoprolol succinate XL  25 mg Oral Daily    niacin  500 mg Oral BID    pantoprazole  40 mg Intravenous BID    sodium phosphate  1 enema Rectal Once     Current Facility-Administered Medications   Medication Dose Route Frequency Last Rate    lactated ringers   Intravenous Continuous Stopped (02/11/23 1203)     Current Facility-Administered Medications   Medication Dose Route    sodium chloride   Intravenous    sodium chloride   Intravenous    acetaminophen  650 mg Oral    benzocaine-menthol  1 lozenge Buccal    benzonatate  100 mg Oral    carboxymethylcellulose sodium  1 drop Both Eyes    dextrose  15 g of glucose Oral    Or    dextrose  12.5 g Intravenous    Or  dextrose  12.5 g Intravenous    Or    glucagon (rDNA)  1 mg Intramuscular    naloxone  0.2 mg Intravenous    ondansetron  4 mg Oral    Or    ondansetron  4 mg Intravenous    saline  2 spray Each Nare        This clinical review is based on/compiled from documentation provided by the treatment team within the patient's medical record.        On PCU 02/11/23 :  NPO for EGD/Colonoscopy today  Cont trend H&H, transfuse to keep Hgb > 7    Transfuse (another) 1unit PRBC for H&H 6.9/22.6   20mg  IV Lasix xs 1    98.2, HR 78, RR 20, BP 96/65, sats 95% RA  Glucose 171-170-178, PTINR 15.3/1.3, BUN 46, Cr 1.9, ALB 3.2, Bili total 1.9, H&H 6.9/22.6 to 7.7/25.2, Platelets 107, RBC 2.38      Per ONC 02/11/23 :  Hematology history  Pt is followed by Dr Laurine Blazer with a history of CLL  He is on acalabrutinib since July 2023  Pt was seen in our office 4/24 and sent to ED with Hgb 6.1  Prior Hgb 4/10 = 9.4, Dec 2023 =12.9  Pt undergoing work up for GIB  Iron studies pending  Hold acalabrutinib while undergoing work up      Per Cardio 02/11/23 :  ASSESSMENT:   Patient is a 77 y.o. male with the following  cardiology relevant diagnoses.        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  Class 4 on admission now 3.  GDMT limited by renal function    Echo 12/21/22:  LVEF 11%, severe MR, severe TR, PHTN 51  Acute anemia with Hgb 6.3 (Hgb last month 10.5) due to GIB  Nonspecific hsTrop abnormality NOT indicative of ACS.  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 Renaee Munda  RECOMMENDATIONS:      Recommended medical therapy for congestive heart failure.  Medical therapy limited by hypotension.  CHF nursing protocol with daily standing weights and strict monitoring of in/out fluid balance.  Low salt diet.  Daily monitoring of electrolyte levels with replacement of potassium to maintain levels 4-5 mEq/L and magnesium 2-3 mg/dL.  Fluid restriction 1.5-2L/24hrs  ACEIARB/ARNI: Consider if BP and renal functio nallows  BB: Toprol XL  MRA Antagonist: Not recommended due to poor renal function  SGLT2i: Plan to start  Diuretic: Lasix 80mg  IV BID.  May need more as he has been transfused PRBC  ACHF is not available today.  Not sure why patient is on aspirin or amiodarone since he has nonischemic cardiomyopathy and no AFIB.  This is not clear from quick chart review and CHF note 4/24.  Would continue amiodarone for now since rhythm is stable but need to clarify indication.  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  Antral gastritis with no biopsies taken.     Anesthesia:   No value filed.     Estimated Blood Loss:    * No values recorded between 02/11/2023 11:13 AM and 02/11/2023 12:00 PM *     Implants:      Implant Name Type Inv. Item Serial No. Model No. Manufacturer Lot No. LRB No. Used Action   CLIP HEMOSTATIC L235 CM OD2.8 MM BRAID - [id number] Clip CLIP HEMOSTATIC L235 CM OD2.8 MM BRAID [id number] [employer] [id number]   1 Implanted   CLIP HEMOSTATIC L235 CM OD2.8 MM BRAID - [id number] Clip CLIP HEMOSTATIC L235 CM OD2.8 MM BRAID [id number] [employer] [id number]   1 Implanted   CLIP HEMOSTATIC L235 CM OD2.8 MM BRAID - [id number] Clip CLIP HEMOSTATIC L235 CM OD2.8 MM BRAID [id number] [employer] [id number]   1 Implanted        Drains:   Drains: no        Specimens:      ID Type Source Tests Collected by Time Destination   A :  No Specimen Required No Specimen NO PATHOLOGY Doyne Keel, MD 02/11/2023 1158              Complications:   None        MD assessment/plan 02/11/23 :  Assessment/Plan:   77 year old man with HTN, NICM, systolic heart failure, s/p ICD, CKD stage IV, DVT on Eliquis, CLL, presented to oncologist for routine visit and found to have severe anemia.  Hemoglobin 6 mg/dL.  Patient reported black stool.  Admitted for GI bleed.     Acute blood loss anemia secondary to duodenal bleed  S/p 2 units of PRBC.  Last hemoglobin 7.7 mg/dL per  EGD by Dr. 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  Avoid NSAIDs.  Hold Eliquis for now.  Timing of resumption of anticoagulation deferred to GI  Continue to monitor H&H and transfuse to  keep hemoglobin above 7 mg/dL     Acute thrombocytopenia  Likely consumptive loss from duodenal bleed  PLT 109, down from 130  Continue to monitor daily CBC     Chronic systolic heart failure  S/p ICD for nonischemic cardiomyopathy  Mild/trace edema and LLE  Clinical symptoms euvolemic  Caution with IV fluid  Continue Lasix 80 mg IV twice daily  Followed by [facility]     CKD stage IV  Creatinine stable 1.9.  Baseline 2-3 0.8  Followed by nephrology     CLL  No active issues  Hematology is Dr. Laurine Blazer     History of DVT  Eliquis on hold     Essential hypertension  Continue metoprolol, Isordil, hydralazine, amiodarone  DVT Prohylaxis:contraindicated  active bleeding  and SEDs   GI Prophylaxis: Protonix

## 2023-02-11 NOTE — Transfer of Care (Signed)
Anesthesia Transfer of Care Note    Patient: [name] [name]    Procedures performed: Procedure(s):  EGD and clip placement    Anesthesia type: MAC    Patient location:Phase I PACU    Last vitals:   Vitals:    02/11/23 1218   BP: 102/68   Pulse: 66   Resp: 18   Temp:    SpO2: 99%       Post pain: Patient not complaining of pain, continue current therapy      Mental Status:awake    Respiratory Function: tolerating room air    Cardiovascular: stable    Nausea/Vomiting: patient not complaining of nausea or vomiting    Hydration Status: adequate    Post assessment: no apparent anesthetic complications    Signed by: Rita Ohara, CRNA  02/11/23 12:18 PM

## 2023-02-11 NOTE — Anesthesia Preprocedure Evaluation (Signed)
Anesthesia Evaluation    AIRWAY    Mallampati: II    TM distance: >3 FB  Neck ROM: full  Mouth Opening:full   CARDIOVASCULAR    regular       DENTAL                   PULMONARY    clear to auscultation     OTHER FINDINGS                                      Relevant Problems   No relevant active problems               Anesthesia Plan    ASA 4     general               (Has received 1 unit blood. HGB 7.7 prior. Will give one more unit for EGD given his ongoing bleeding, colon prep and cardiomyopathy.  Will follow with lasix. )      intravenous induction   Detailed anesthesia plan: general IV            informed consent obtained    ECG reviewed  pertinent labs reviewed             Signed by: Blain Pais, MD 02/11/23 10:58 AM

## 2023-02-11 NOTE — Consults (Incomplete)
[facility]  [facility]  Service Phone Number: [phone]     Palliative Care Consult   Date Time: 02/11/23 11:51 AM   Patient Name: [name] [name], [name] [name]   Location: [id number]/B1478.A   Attending Physician: Kavin Leech, MD   Primary Care Physician: Zorita Pang, MD   Consulting Provider: Lannie Fields, NP   Consulting Service: Palliative Medicine and Comprehensive Care  Consult request from Kavin Leech, MD to see patient regarding:   Reason for Referral: Clarify goals of care     Palliative Diagnosis Category: Hematology, Cancer, and Cardiac  Patient Type: New       Assessment & Plan   Impression   [name] [name] 77 y.o. 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  [name] [name] presented to the ED from oncology appointment due to Hgb of 6.1.  He is being evaluated for GIB and is undergoing EGD this morning (4/25).      Estimated Prognosis: 6-12 months according to AHF team.         Recommendations   1. Goals of Care:           2. Physical Aspects of Care:   Pain: {Pain Assessment:52770}    Pain Location and Description:  *  *  *    Pain Medication Regimen:  *  *  *      - Current Oral Morphine Equivalent (OME)***    - Prescription Monitoring Program (PMP) reviewed - no discrepancies noted    - This medication requires intensive monitoring for drug toxicity and will be monitored during the next 24h    - OMEs calculated using updated opioid conversion table (McPherson ML. Demystifying Opioid Conversion Calculations, A Guide for Effective Dosing, 2nd Edition. ASHP; 2018.)      Bowel Habits  *Last bowel movement:  *Bowel Regimen:    Dyspnea    Appetite/Nutrition    Nausea/Vomiting        3. Psychological Aspects of Care  Mood  Anxiety  Depression  Sleep  Disturbances  Delirium        4. Social Aspects of Care:        5. Spiritual Aspects of Care: [employer]. ***        6. Palliative Care Team follow-up plans: {follow up:32580}      Discharge Disposition: {Disposition:46866}      Outpatient Follow Up Recommended: {Follow Up Care Needed?:46864}    Outcomes: {Outcomes:46870}    Total time of *** minutes spent today in care of [name] [name] including medical record review, clinical assessment and management, family counseling, and coordination of care with {Palliative Discussed GNFA:21308} regarding recommendations above.    Start Time:       11:51am              Stop Time:   12:02pm  Chart Review    Start Time:                     Stop Time:    Lannie Fields, NP, MD/NP  Palliative Medicine & Comprehensive Care  Phone Number: [phone] or secure chat, M-F, 9a-4p     History of Presenting Illness   [name] [name] is a 77 y.o. male admitted to hospital on 02/10/2023 with GI bleed [K92.2]      Advance  Care Planning   Goals of Care   ACP Validation: {Advance Care Planning Validation:46865}    ACP Document: {ACP Documents:52764}    Surrogate Decision Maker: {MEDICAL DECISION ZOXWR:60454}  Contact information: ***    CODE STATUS: Full Code     Advanced Care Planning:      Decisional Capacity: {UJW:11914}      Advance Directives have been completed in the past: {NWG:95621}      Advance Directives are available in chart: {HYQ:65784}      Discussed this admission: {YES:25569}      ACP note completed: {ONG:29528}       Date:     GOC Discussion:       From [name] [name]'s note 12/31/2022  Patient has capacity for medical decision making  No AD, default next of kin is his spouse [name] [name]  Patient has a somewhat limited understanding of his medical condition   - Goal is "to have 2-3 more years with my wife"   - Understands that LVAD is not an option and is optimistic about medical management  - His main priority is focus on his spirituality feeling that "I can beat  this, with God all things are possible" citing biblical references of healing and raising the dead  - He is focused on how his brother survived on dialysis for 10 years, feeling that he can do the same; without being able to separate his kidney disease (which does not currently require dialysis) from his end stage heart disease  - He is not interested in home based hospice or palliative care and is interested primarily on cure through prayer. He understands that these things can happen in parallel    Outcome of Discussion: {UXL2:440102}        Palliative Functional and Symptom Assessment      ADLs prior to admission:    Independent Needs assistance Dependent   Ambulation [x]  []  []    Transferring [x]  []  []    Dressing [x]  []  []    Bathing [x]  []  []    Toileting [x]  []  []    Feeding [x]  []  []      Palliative Performance Scale: {Palliative Performance Scale:52765}    FAST Score (Dementia Patients): {Functional Assessment Scale (FAST):52767}    ECOG (Cancer Patients): {ECOG Performance Status:52768}    [place] Symptom Assessment Scale (ESAS): {ESAS Status:52861}      {Anxiety:52771}  {Depression:52772}  {Drowsiness:52773}  {Lack of Appetite:52774}  {Nausea:52775}  {Pain:52776}  {Shortness of Breath:52777}  {Tiredness:52778}  {Well-being:52779}     Review of Systems   []  Unable to obtain secondary to {No ROS:52766}  []  As per HPI and physical exam, otherwise all systems negative    Review of Systems          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    Coronary artery  disease 2011    CORONARY STENT PLACEMENT FOLLOWED BY  HEART  Depression     echocardiogram 02/2016, 11/2016, 09/2017, 12/20/2017    GERD (gastroesophageal reflux disease)     Heart attack 2011    and 2014    Hyperlipemia     Hypertension     ICD (implantable 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     PLACED 2014  [facility]  LAST INTERROGATION April 01 2018 REPORT REQUESTED    Ischemic cardiomyopathy     EF 15% ON ECHO 09-20-2017 DR GARG IN EPIC    Nonischemic cardiomyopathy     NSTEMI (non-ST elevated myocardial infarction)     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    Pacemaker 2014    MEDTRONIC ICD/PACEMAKER COMBO LAST INTERROGATION  12-12 2018    Pneumonia 09/2016    Primary cardiomyopathy     Ischemic cardiomyopathy EF 15% ON ECHO 09-20-2017 DR GARG IN EPIC    Sepsis 08/2017    Syncope and collapse     PRIOR TO ICD/PACEMAKER PLACED 2014    Type 2 diabetes mellitus, controlled DX 1998    BS  AVG 100  A1C 7.4 Dec 27 2017    [facility]     PT SEE HIS PMD April 01 2018 RE THIS-TOLD TO SEE PMD BY [facility] JUNE 12 WHEN THEY SAW HIM FOR A CL      Past Surgical History:   Procedure Laterality Date    BIV  11/10/2016    ICD METRONIC  UPGRADE     CARDIAC CATHETERIZATION  03/2010    CORONARY STENT PLACEMENT X 2 PER PT; LM normal, LAD 70-80% distal lesion at apex, 20% lesion mid CFX    CARDIAC CATHETERIZATION  12/2012    CARDIAC DEFIBRILLATOR PLACEMENT  12/2012    [facility]    CARDIAC PACEMAKER PLACEMENT  2014    MEDTRONIC ICD/PACEMAKER PLACED [facility]    CIRCUMCISION  AGE 38    COLONOSCOPY, DIAGNOSTIC (SCREENING)  2009    CORRECTION HAMMER TOE      duodenal ulcer  1973    STRESS RELATED IN SCHOOL    ECHOCARDIOGRAM, TRANSTHORACIC  02/2016 11/2016 09/2017 12/2017    ECHOCARDIOGRAM, TRANSTHORACIC  02/2016,11/2016,12/20/2017    EGD N/A 08/15/2014    Procedure: EGD;  Surgeon: Colon Branch, MD;  Location: [facility];  Service: Gastroenterology;  Laterality: N/A;  egd w/ bx    EGD  1975    EGD, COLONOSCOPY N/A 04/04/2018    Procedure: EGD with bxs,  COLONOSCOPY with polypectomy and clipping;  Surgeon: Doyne Keel, MD;  Location: [facility];  Service: Gastroenterology;  Laterality: N/A;    FRACTURE SURGERY  AGE 43     RT  ANKLE- CAST APPLIED    HERNIA REPAIR  AGE 38    LEFT INGUINAL HERNIA    MPI nuclear study  06/2016    orthopedic surgery  AGE 3    RT foot corrective - HAMMERTOE    RIGHT HEART CATH Right 09/09/2022    Procedure: Right Heart Cath;  Surgeon: Burna Forts, MD;  Location: [facility];  Service: Cardiovascular;  Laterality: Right;  tx from FO    RIGHT HEART CATH ONLY INCL. CO AND SATS Right 12/22/2022    Procedure: Right Heart Cath ONLY incl. CO and Sats;  Surgeon: Day, Bertram Millard, MD;  Location: [facility];  Service: Cardiovascular;  Laterality: Right;  Right Heart Cath with leave in PA cath for milrinone initiation    TONSILLECTOMY AND ADENOIDECTOMY  1954    TUNNELED CATH PLACEMENT ([facility])  N/A 12/25/2022    Procedure: [facility];  Surgeon: Tamala Bari, MD;  Location: [facility];  Service: Interventional Radiology;  Laterality: N/A;    TUNNELED CATH REMOVAL ([facility]ATH) Right 01/04/2023    Procedure: TUNNELED CATH REMOVAL;  Surgeon: Rex Kras, MD;  Location: [facility];  Service: Interventional Radiology;  Laterality: Right;      Family History   Problem Relation Age of Onset    Breast cancer Mother     Heart attack Mother 6    Hypertension Mother     Diabetes Mother     Coronary artery disease Mother     Diabetes Brother     Heart attack Father     Hypertension Father        Social History   Substance Use:    reports that he has never smoked. He has never used smokeless tobacco.    reports that he does not currently use alcohol.    reports no history of drug use.     Marital Status: {Desc; marital status:62}    Home/Family: ***    Occupation/Hobbies: ***    Cultural Concerns:    Translator needed: []  NO   []  YES                                       Language: {Languages:200004}   ID#_________    Spirituality and Importance: Baptist      Medications   Scheduled Meds  Current Facility-Administered Medications   Medication Dose Route Frequency    amiodarone  200 mg Oral Daily    atorvastatin  40 mg Oral QHS    furosemide  80 mg Intravenous BID    hydrALAZINE  100 mg Oral Q8H SCH    insulin glargine  15 Units Subcutaneous QAM    insulin lispro  1-5 Units Subcutaneous Q4H SCH    isosorbide dinitrate  40 mg Oral TID - Nitrate Free Interval    metoprolol succinate XL  25 mg Oral Daily    niacin  500 mg Oral BID    pantoprazole  40 mg Intravenous BID    sodium phosphate  1 enema Rectal Once      DRIPS   lactated ringers 1,000 mL (02/11/23 0957)      PRN MEDS  Current Facility-Administered Medications   Medication Dose    sodium chloride      sodium chloride      acetaminophen  650 mg    benzocaine-menthol  1 lozenge    benzonatate  100 mg    carboxymethylcellulose sodium  1 drop    dextrose  15 g of glucose    Or    dextrose  12.5 g    Or    dextrose  12.5 g    Or    glucagon (rDNA)  1 mg    naloxone  0.2 mg    ondansetron  4 mg    Or    ondansetron  4 mg    saline  2 spray       Allergies   Allergies   Allergen Reactions    Allopurinol        Physical Exam   BP 106/70   Pulse 72   Temp 97 F (36.1 C)   Resp 20   Ht 1.829 m (6')   Wt 88.5 kg (195 lb)   SpO2 100%  BMI 26.45 kg/m      Physical Exam:  Physical Exam  ***     Labs / Radiology   Lab and diagnostics: reviewed in Epic  Recent Labs   Lab 02/11/23  0839   WBC 9.50   Hgb 7.7*   Hematocrit 25.2*   Platelets 107*       Recent Labs   Lab 02/11/23  0839 02/10/23  1131   PT 15.3* 22.1*   PT INR 1.3* 1.9*   PTT  --  33        Recent Labs   Lab 02/11/23  0839   Sodium 141   Potassium 4.0   Chloride 105   CO2 25   BUN 46.0*   Creatinine 1.9*   eGFR 36.1*   Glucose 178*   Calcium 8.9     Recent Labs   Lab 02/11/23  0839   Bilirubin, Total 1.9*   Protein, Total 5.5*   Albumin 3.2*   ALT 24   AST (SGOT) 22          No results found.

## 2023-02-12 DIAGNOSIS — C9110 Chronic lymphocytic leukemia of B-cell type not having achieved remission: Secondary | ICD-10-CM

## 2023-02-12 DIAGNOSIS — K260 Acute duodenal ulcer with hemorrhage: Secondary | ICD-10-CM

## 2023-02-12 DIAGNOSIS — I255 Ischemic cardiomyopathy: Secondary | ICD-10-CM

## 2023-02-12 DIAGNOSIS — D509 Iron deficiency anemia, unspecified: Secondary | ICD-10-CM | POA: Insufficient documentation

## 2023-02-12 DIAGNOSIS — Z7189 Other specified counseling: Secondary | ICD-10-CM

## 2023-02-12 DIAGNOSIS — Z515 Encounter for palliative care: Secondary | ICD-10-CM

## 2023-02-12 DIAGNOSIS — Z710 Person encountering health services to consult on behalf of another person: Secondary | ICD-10-CM

## 2023-02-12 LAB — CBC AND DIFFERENTIAL
Absolute NRBC: 0.05 10*3/uL — ABNORMAL HIGH (ref 0.00–0.00)
Basophils Absolute Automated: 0.01 10*3/uL (ref 0.00–0.08)
Basophils Automated: 0.1 %
Eosinophils Absolute Automated: 0.05 10*3/uL (ref 0.00–0.44)
Eosinophils Automated: 0.6 %
Hematocrit: 26 % — ABNORMAL LOW (ref 37.6–49.6)
Hgb: 8.3 g/dL — ABNORMAL LOW (ref 12.5–17.1)
Immature Granulocytes Absolute: 0.03 10*3/uL (ref 0.00–0.07)
Immature Granulocytes: 0.3 %
Instrument Absolute Neutrophil Count: 3.72 10*3/uL (ref 1.10–6.33)
Lymphocytes Absolute Automated: 4.87 10*3/uL — ABNORMAL HIGH (ref 0.42–3.22)
Lymphocytes Automated: 55 %
MCH: 31.8 pg (ref 25.1–33.5)
MCHC: 31.9 g/dL (ref 31.5–35.8)
MCV: 99.6 fL — ABNORMAL HIGH (ref 78.0–96.0)
MPV: 11.8 fL (ref 8.9–12.5)
Monocytes Absolute Automated: 0.17 10*3/uL — ABNORMAL LOW (ref 0.21–0.85)
Monocytes: 1.9 %
Neutrophils Absolute: 3.72 10*3/uL (ref 1.10–6.33)
Neutrophils: 42.1 %
Nucleated RBC: 0.6 /100 WBC — ABNORMAL HIGH (ref 0.0–0.0)
Platelets: 102 10*3/uL — ABNORMAL LOW (ref 142–346)
RBC: 2.61 10*6/uL — ABNORMAL LOW (ref 4.20–5.90)
RDW: 25 % — ABNORMAL HIGH (ref 11–15)
WBC: 8.85 10*3/uL (ref 3.10–9.50)

## 2023-02-12 LAB — COMPREHENSIVE METABOLIC PANEL
ALT: 20 U/L (ref 0–55)
AST (SGOT): 16 U/L (ref 5–41)
Albumin/Globulin Ratio: 1.5 (ref 0.9–2.2)
Albumin: 2.9 g/dL — ABNORMAL LOW (ref 3.5–5.0)
Alkaline Phosphatase: 60 U/L (ref 37–117)
Anion Gap: 9 (ref 5.0–15.0)
BUN: 33 mg/dL — ABNORMAL HIGH (ref 9.0–28.0)
Bilirubin, Total: 1.2 mg/dL (ref 0.2–1.2)
CO2: 25 mEq/L (ref 17–29)
Calcium: 8.4 mg/dL (ref 7.9–10.2)
Chloride: 106 mEq/L (ref 99–111)
Creatinine: 1.7 mg/dL — ABNORMAL HIGH (ref 0.5–1.5)
Globulin: 2 g/dL (ref 2.0–3.6)
Glucose: 154 mg/dL — ABNORMAL HIGH (ref 70–100)
Potassium: 3.6 mEq/L (ref 3.5–5.3)
Protein, Total: 4.9 g/dL — ABNORMAL LOW (ref 6.0–8.3)
Sodium: 140 mEq/L (ref 135–145)
eGFR: 41.2 mL/min/{1.73_m2} — AB (ref >=60)

## 2023-02-12 LAB — WHOLE BLOOD GLUCOSE POCT
Whole Blood Glucose POCT: 205 mg/dL — ABNORMAL HIGH (ref 70–100)
Whole Blood Glucose POCT: 145 mg/dL — ABNORMAL HIGH (ref 70–100)
Whole Blood Glucose POCT: 148 mg/dL — ABNORMAL HIGH (ref 70–100)
Whole Blood Glucose POCT: 197 mg/dL — ABNORMAL HIGH (ref 70–100)
Whole Blood Glucose POCT: 211 mg/dL — ABNORMAL HIGH (ref 70–100)

## 2023-02-12 MED ORDER — POTASSIUM & SODIUM PHOSPHATES 280-160-250 MG PO PACK
2.0000 | PACK | ORAL | Status: DC | PRN
Start: 2023-02-12 — End: 2023-02-12

## 2023-02-12 MED ORDER — INSULIN LISPRO (1 UNIT DIAL) 100 UNIT/ML SC SOPN
10.0000 [IU] | PEN_INJECTOR | Freq: Three times a day (TID) | SUBCUTANEOUS | 0 refills | Status: DC
Start: 2023-02-12 — End: 2023-04-27

## 2023-02-12 MED ORDER — TORSEMIDE 20 MG PO TABS
80.0000 mg | ORAL_TABLET | Freq: Every day | ORAL | Status: DC
Start: 2023-02-12 — End: 2023-02-12

## 2023-02-12 MED ORDER — POTASSIUM CHLORIDE 10 MEQ/100ML IV SOLN
10.0000 meq | INTRAVENOUS | Status: DC | PRN
Start: 2023-02-12 — End: 2023-02-12

## 2023-02-12 MED ORDER — MAGNESIUM SULFATE IN D5W 1-5 GM/100ML-% IV SOLN
1.0000 g | INTRAVENOUS | Status: DC | PRN
Start: 2023-02-12 — End: 2023-02-12

## 2023-02-12 MED ORDER — SPIRONOLACTONE 25 MG PO TABS
12.5000 mg | ORAL_TABLET | Freq: Every day | ORAL | Status: DC
Start: 2023-02-12 — End: 2023-02-12
  Administered 2023-02-12: 12.5 mg via ORAL
  Filled 2023-02-12: qty 1

## 2023-02-12 MED ORDER — IRON SUCROSE 20 MG/ML IV SOLN
200.0000 mg | INTRAVENOUS | Status: DC
Start: 2023-02-12 — End: 2023-02-12

## 2023-02-12 MED ORDER — SPIRONOLACTONE 25 MG PO TABS
12.5000 mg | ORAL_TABLET | Freq: Every day | ORAL | 0 refills | Status: DC
Start: 2023-02-13 — End: 2023-03-17

## 2023-02-12 MED ORDER — HYDRALAZINE HCL 100 MG PO TABS
100.0000 mg | ORAL_TABLET | Freq: Three times a day (TID) | ORAL | 0 refills | Status: DC
Start: 2023-02-12 — End: 2023-02-26

## 2023-02-12 MED ORDER — TORSEMIDE 40 MG PO TABS
80.0000 mg | ORAL_TABLET | Freq: Every day | ORAL | 0 refills | Status: DC
Start: 2023-02-13 — End: 2023-07-07

## 2023-02-12 MED ORDER — POTASSIUM CHLORIDE CRYS ER 20 MEQ PO TBCR
0.0000 meq | EXTENDED_RELEASE_TABLET | ORAL | Status: DC | PRN
Start: 2023-02-12 — End: 2023-02-12
  Administered 2023-02-12: 40 meq via ORAL
  Filled 2023-02-12: qty 2

## 2023-02-12 MED ORDER — EMPAGLIFLOZIN 10 MG PO TABS
10.0000 mg | ORAL_TABLET | Freq: Every day | ORAL | 0 refills | Status: DC
Start: 2023-02-13 — End: 2023-03-17

## 2023-02-12 MED ORDER — PANTOPRAZOLE SODIUM 40 MG PO TBEC
40.0000 mg | DELAYED_RELEASE_TABLET | Freq: Two times a day (BID) | ORAL | 0 refills | Status: DC
Start: 2023-02-12 — End: 2023-03-17

## 2023-02-12 MED ORDER — EMPAGLIFLOZIN 10 MG PO TABS
10.0000 mg | ORAL_TABLET | Freq: Every day | ORAL | Status: DC
Start: 2023-02-12 — End: 2023-02-12
  Administered 2023-02-12: 10 mg via ORAL
  Filled 2023-02-12: qty 1

## 2023-02-12 MED ORDER — TORSEMIDE 20 MG PO TABS
80.0000 mg | ORAL_TABLET | Freq: Every day | ORAL | Status: DC
Start: 2023-02-12 — End: 2023-02-12
  Administered 2023-02-12: 80 mg via ORAL
  Filled 2023-02-12: qty 4

## 2023-02-12 NOTE — Progress Notes (Signed)
[facility] H[facility] - [facility] [address] PROGRESSIVE CARE   Patient Name: [name] [name]   Attending Physician: Kavin Leech, MD   Today's date:   02/12/2023 LOS: 2 days   Expected Discharge Date 02/12/2023    Chief Complaint Abnormal Lab        Admission Diagnosis   1. GI bleed    2. Acute blood loss anemia    3. Anticoagulated by anticoagulation treatment    4. Personal history of CLL (chronic lymphocytic leukemia)    5. History of CHF (congestive heart failure)    6. Elevated troponin    7. Elevated brain natriuretic peptide (BNP) level    8. Chronic kidney disease, unspecified CKD stage    9. Anemia, unspecified type        Quick  Assessment:                                                              ReAdmit Risk Score: 41.63    Physical Discharge Disposition: Home, Home Health w/[facility] SN/PT/OT    Patient's PCP notified of follow-up needs.        Name of Home Health Agency Placement: [facility], [facility]                          Mode of Transportation: Car     Patient/Family/POA notified of transfer plan: Yes  Patient agreeable to discharge plan/expected d/c date?: Yes  Family/POA agreeable to discharge plan/expected d/c date?: Yes  Bedside nurse notified of transport plan?: Yes                       Physical Discharge Disposition: Home, Home Health     02/12/23 1804   Discharge Disposition   Patient preference/choice provided? Yes   Physical Discharge Disposition Home, Home Health   Name of Home Health Agency Placement [facility], [facility]   Mode of Transportation Car   Patient/Family/POA notified of transfer plan Yes   Patient agreeable to discharge plan/expected d/c date? Yes   Family/POA agreeable to discharge plan/expected d/c date? Yes   Bedside nurse notified of transport plan? Yes   Outpatient Services   Home Health Skilled Nursing;Home PT/OT/ST   CM Interventions   Follow up appointment scheduled? No   Reason no follow up scheduled? Family to schedule    Notified MD? Yes   Referral made for home health RN visit? Yes   Multidisciplinary rounds/family meeting before d/c? Yes   Medicare Checklist   Is this a Medicare patient? No          Provider Notifications:   Bedside RN and Attending MD notified; all are in agreement with discharge plan.       Neldon Labella. Cristin Szatkowski, BSN, RN, CCM  RN Case Manager II  [facility]    P: [phone]   F: [phone]  [email or web address]

## 2023-02-12 NOTE — Consults (Signed)
[facility]  [facility]  Service Phone Number: [phone]     Palliative Care Consult   Date Time: 02/12/23 11:06 AM   Patient Name: [name] [name], [name] [name]   Location: [id number]/D6387.A   Attending Physician: Kavin Leech, MD   Primary Care Physician: Zorita Pang, MD   Consulting Provider: Lannie Fields, NP   Consulting Service: Palliative Medicine and Comprehensive Care  Consult request from Kavin Leech, MD to see patient regarding:   Reason for Referral: Clarify goals of care     Palliative Diagnosis Category: Cardiac and GI  Patient Type: New       Assessment & Plan   Impression   [name] [name] 77 y.o. 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  [name] [name] presented to the ED from oncology appointment due to Hgb of 6.1.  Reported having a black stool.  Admitted for evaluation of GIB.  He has received 2 units of PRBCs.    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 Antral gastritis.  Possible discharge today, 4/26.    Estimated Prognosis: Unclear     Recommendations   1. Goals of Care:   [name] [name] wants to achieve optimal quality of life for as long as possible.  He is hoping to keep his heart condition stable, exercise, and keep up his strength.  He seems to be committed to continuing to follow his medical plan of care and the recommendations as prescribed by his medical team.  Quality of life is important and to extend life as long as it is meaningful.     He wants to maintain full code status and attempt of resuscitation if needed. However, he would not want his life to be artificially sustained/prolonged if he is not expected to recover.       2. Physical Aspects of Care:   Pain: none  Bowel Habits - no issues   Dyspnea - none at rest, no  supplemental oxygen requirements.  Appetite/Nutrition - no issues reported  Nausea/Vomiting - none    3. Psychological Aspects of Care  Mood - pleasant with bright spirits.  Expresses concerns about maintaining stability of his chronic illnesses.  He conveys that maintaining medical appointments and his medical plan of care is very important.      4. Social Aspects of Care: [name] [name] resides with his wife of 32 years, Lajoyce Corners.  He has 4 children from his first marriage with whom is trying to re-establish relationships. (3 sons and 1 daughter).      5. Spiritual Aspects of Care: [employer]. Did not discuss today.        6. Palliative Care Team follow-up plans: No further follow up indicated at this time.      Discharge Disposition: Home      Outpatient Follow Up Recommended: No    Outcomes: Clarified goals of care, Provided advance care planning, and Provided psychosocial or spiritual support    Total time of 79 minutes spent today in care of Kacy Vernell Leep including medical record review, clinical assessment and management, family counseling, and coordination of care with Patient, referring provider, Bedside Nurse, and Case manager regarding recommendations above.    Start Time:         11:15am  Stop Time:  12:34pm  Chart review, patient visit and care team collaboration, and documentation      Lannie Fields, NP, MD/NP  Palliative Medicine [facility] Comprehensive Care  Phone Number: [phone] or secure chat, M-F, 9a-4p     History of Presenting Illness   [name] [name] is a 77 y.o. male admitted to hospital on 02/10/2023 with GI bleed [K92.2]     Mr. Talarico found resting in bed, alert and oriented x4.  He is in no distress and appears comfortable with non-labored breathing.     Advance Care Planning   Goals of Care   ACP Validation: Advance care planning discussion initialized, copy of [place] Advance Directive provided for his consideration.    ACP Document: None    Surrogate Decision  Maker: Next of Kin status: wife: [name] [name] [phone]  Contact information: [phone]    CODE STATUS: Full Code     Advanced Care Planning:      Decisional Capacity: yes      Advance Directives have been completed in the past: no      Advance Directives are available in chart: no      Discussed this admission: yes      ACP note completed: no       Date:     GOC Discussion:   Met with [name] [name] at bedside.  Introduced palliative care and explained the concepts of support:  1-Expert pain/symptom management     2-Communication  -facilitation of discussions about what is most important/goals of care  -reviewing the medical treatments and making sure that they align with wishes for care  -advance directive review, and completion if necessary     3-Transitions of Care  -collaborate with case management as the plans for post hospital care are developed and to make sure that the goal of care is clear and plans align.    Goals of Care discussed in terms of comfort/quality of life vs function/independence vs longevity.    What matters most right now?  [name] [name] states that he is most worried about his heart and wanting to keep its condition stable with the conservative approach as prescribed by his cardiologist.  He is worried about his health; kidney disease, heart, CLL.    Are there specific goals you are hoping to achieve?   Mr. Carfagno wants to maintain his quality of life for as long as possible.      Outcome of Discussion: treatment to preserve optimal strength and function         Palliative Functional and Symptom Assessment      ADLs prior to admission:    Independent Needs assistance Dependent   Ambulation [x]  []  []    Transferring [x]  []  []    Dressing [x]  []  []    Bathing [x]  []  []    Toileting [x]  []  []    Feeding [x]  []  []      Palliative Performance Scale: 60% - Reduced ambulation, unable to do hobby or some housework, significant disease, occasional assist necessary with self-care, normal or reduced  intake, full LOC or confusion    FAST Score (Dementia Patients): N/A    ECOG (Cancer Patients): 2 - Ambulatory and capable of all self-care but unable to carry out any work activities, up and about more than 50% of waking hours    [place] Symptom Assessment Scale (ESAS): Unable to assess       Review of Systems       Review of Systems   Constitutional:  Positive for fatigue.   HENT:  Negative for hearing loss and trouble swallowing.    Respiratory:  Negative for shortness of breath.    Gastrointestinal:  Negative for constipation, diarrhea, nausea and vomiting.   Neurological: Negative.    Psychiatric/Behavioral: Negative.               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    Coronary artery disease 2011    CORONARY STENT PLACEMENT FOLLOWED BY [facility]    Depression     echocardiogram 02/2016, 11/2016, 09/2017, 12/20/2017    GERD (gastroesophageal reflux disease)     Heart attack 2011    and 2014    Hyperlipemia     Hypertension     ICD (implantable 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     PLACED 2014  [facility]  LAST INTERROGATION April 01 2018 REPORT REQUESTED    Ischemic cardiomyopathy     EF 15% ON ECHO 09-20-2017 DR GARG IN EPIC    Nonischemic cardiomyopathy     NSTEMI (non-ST elevated myocardial infarction)     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    Pacemaker 2014    MED[employer] ICD/PACEMAKER COMBO LAST INTERROGATION  12-12 2018    Pneumonia 09/2016    Primary cardiomyopathy     Ischemic cardiomyopathy EF 15% ON ECHO 09-20-2017 DR GARG IN EPIC    Sepsis 08/2017    Syncope  and collapse     PRIOR TO ICD/PACEMAKER PLACED 2014    Type 2 diabetes mellitus, controlled DX 1998    BS  AVG 100  A1C 7.4 Dec 27 2017    [place]eze     PT SEE HIS PMD April 01 2018 RE THIS-TOLD TO SEE PMD BY [facility] JUNE 12 WHEN THEY SAW HIM FOR A CL      Past Surgical History:   Procedure Laterality Date    BIV  11/10/2016    ICD METRONIC  UPGRADE     CARDIAC CATHETERIZATION  03/2010    CORONARY STENT PLACEMENT X 2 PER PT; LM normal, LAD 70-80% distal lesion at apex, 20% lesion mid CFX    CARDIAC CATHETERIZATION  12/2012    CARDIAC DEFIBRILLATOR PLACEMENT  12/2012    [facility]    CARDIAC PACEMAKER PLACEMENT  2014    MEDTR[employer]IC ICD/PACEMAKER PLACED  HEART    CIRCUMCISION  AGE 16    COLONOSCOPY, DIAGNOSTIC (SCREENING)  2009    CORRECTION HAMMER TOE      duodenal ulcer  1973    STRESS RELATED IN SCHOOL    ECHOCARDIOGRAM, TRANSTHORACIC  02/2016 11/2016 09/2017 12/2017    ECHOCARDIOGRAM, TRANSTHORACIC  02/2016,11/2016,12/20/2017    EGD N/A 08/15/2014    Procedure: EGD;  Surgeon: Colon Branch, MD;  Location: [facility];  Service: Gastroenterology;  Laterality: N/A;  egd w/ bx    EGD  1975    EGD  02/11/2023    Procedure: EGD and clip placement;  Surgeon: Doyne Keel, MD;  Location: [facility];  Service: Gastroenterology;;    EGD, COLONOSCOPY N/A 04/04/2018    Procedure: EGD with bxs, COLONOSCOPY with polypectomy and clipping;  Surgeon: Doyne Keel, MD;  Location: [facility];  Service: Gastroenterology;  Laterality: N/A;    FRACTURE SURGERY  AGE 34     RT  ANKLE- CAST APPLIED    HERNIA REPAIR  AGE 89    LEFT INGUINAL HERNIA    MPI nuclear study  06/2016    orthopedic surgery  AGE 80    RT foot corrective - HAMMERTOE    RIGHT HEART CATH Right 09/09/2022    Procedure: Right Heart Cath;  Surgeon: Burna Forts, MD;  Location[facility];  Service: Cardiovascular;  Laterality: Right;  tx from FO    RIGHT HEART CATH ONLY INCL. CO AND SATS Right 12/22/2022    Procedure: Right  Heart Cath ONLY incl. CO and Sats;  Surgeon: Day, Bertram Millard, MD;  Location: [facility];  Service: Cardiovascular;  Laterality: Right;  Right Heart Cath with leave in PA cath for milrinone initiation    TONSILLECTOMY AND ADENOIDECTOMY  1954    TUNNELED CATH PLACEMENT ([facility]) N/A 12/25/2022    Procedure: [facility];  Surgeon: Tamala Bari, MD;  Location: [facility];  Service: Interventional Radiology;  Laterality: N/A;    TUNNELED CATH REMOVAL ([facility]ATH) Right 01/04/2023    Procedure: TUNNELED CATH REMOVAL;  Surgeon: Rex Kras, MD;  Location[facility];  Service: Interventional Radiology;  Laterality: Right;      Family History   Problem Relation Age of Onset    Breast cancer Mother     Heart attack Mother 38    Hypertension Mother     Diabetes Mother     Coronary artery disease Mother     Diabetes Brother     Heart attack Father     Hypertension Father        Social History   Substance Use:    reports that he has never smoked. He has never used smokeless tobacco.    reports that he does not currently use alcohol.    reports no history of drug use.     Marital Status: married    Home/Family: Lives with his wife, Lajoyce Corners.    Occupation/Hobbies: Masters degree - social sciences.  He is very knowledgeable about history, and enjoys discussing history and movies. Worked for [employer], as Runner, 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and traveled a lot.  He use to run track when he was in HS and college.    Cultural Concerns:    Translator needed: [x]  NO   []  YES                                       Language: English      Spirituality and Importance: Baptist      Medications   Scheduled Meds  Current Facility-Administered Medications   Medication Dose Route Frequency    amiodarone  200 mg Oral Daily    atorvastatin  40 mg Oral QHS    empagliflozin  10 mg Oral Daily    hydrALAZINE  100 mg Oral Q8H SCH    insulin glargine  15 Units Subcutaneous QAM    insulin lispro  1-5 Units Subcutaneous Q4H [facility]  isosorbide dinitrate  40 mg Oral TID - Nitrate Free  Interval    metoprolol succinate XL  25 mg Oral Daily    niacin  500 mg Oral BID    pantoprazole  40 mg Intravenous BID    sodium phosphate  1 enema Rectal Once    spironolactone  12.5 mg Oral Daily    torsemide  80 mg Oral Daily      DRIPS     PRN MEDS  Current Facility-Administered Medications   Medication Dose    sodium chloride      sodium chloride      acetaminophen  650 mg    benzocaine-menthol  1 lozenge    benzonatate  100 mg    carboxymethylcellulose sodium  1 drop    dextrose  15 g of glucose    Or    dextrose  12.5 g    Or    dextrose  12.5 g    Or    glucagon (rDNA)  1 mg    magnesium sulfate  1 g    naloxone  0.2 mg    ondansetron  4 mg    Or    ondansetron  4 mg    potassium & sodium phosphates  2 packet    potassium chloride  0-40 mEq    And    potassium chloride  10 mEq    saline  2 spray       Allergies   Allergies   Allergen Reactions    Allopurinol        Physical Exam   BP 105/71   Pulse 87   Temp 97.3 F (36.3 C) (Temporal)   Resp 18   Ht 1.829 m (6')   Wt 89 kg (196 lb 3.2 oz)   SpO2 97%   BMI 26.61 kg/m        Physical Exam  Vitals and nursing note reviewed.   Constitutional:       General: He is not in acute distress.     Appearance: He is not ill-appearing.   HENT:      Head: Normocephalic and atraumatic.      Mouth/Throat:      Mouth: Mucous membranes are moist.   Eyes:      Pupils: Pupils are equal, round, and reactive to light.   Cardiovascular:      Rate and Rhythm: Normal rate.   Pulmonary:      Effort: Pulmonary effort is normal. No respiratory distress.   Abdominal:      General: There is no distension.   Genitourinary:     Comments: External urinary system  Musculoskeletal:         General: Normal range of motion.      Cervical back: Normal range of motion.      Comments: Mild swelling of lower extremities. Generalized weakness.   Skin:     General: Skin is warm and dry.   Neurological:      Mental Status: He is alert and oriented to person, place, and time.   Psychiatric:          Mood and Affect: Mood normal.         Behavior: Behavior normal.         Thought Content: Thought content normal.         Judgment: Judgment normal.        Labs / Radiology   Lab and diagnostics: reviewed in Epic  Recent Labs  Lab 02/12/23  0800   WBC 8.85   Hgb 8.3*   Hematocrit 26.0*   Platelets 102*       Recent Labs   Lab 02/11/23  0839 02/10/23  1131   PT 15.3* 22.1*   PT INR 1.3* 1.9*   PTT  --  33        Recent Labs   Lab 02/12/23  0800   Sodium 140   Potassium 3.6   Chloride 106   CO2 25   BUN 33.0*   Creatinine 1.7*   eGFR 41.2*   Glucose 154*   Calcium 8.4     Recent Labs   Lab 02/12/23  0800   Bilirubin, Total 1.2   Protein, Total 4.9*   Albumin 2.9*   ALT 20   AST (SGOT) 16          No results found.

## 2023-02-12 NOTE — Discharge Summary (Signed)
[facility] HOSPITALIST Discharge Summary  Patient Info:   Date/Time: 02/12/2023 / 4:49 PM   Admit Date:02/10/2023  Patient Name:[name] [name]   ZOX:[id number]   PCP: Zorita Pang, MD  Attending Physician:Mckay Tegtmeyer, Thomasene Lot, MD  Date of admission:   02/10/2023  Type of admission:   Inpatient   Date of discharge:   02/12/23  Discharge Diagnosis:   Primary Diagnosis: GI bleed    Consultations   Treatment Team:   Kavin Leech, MD  Marvell Fuller, MD  Doyne Keel, MD  Mechele Collin, RN  Earney Hamburg, MD  Basil Dess, RT  Satira Mccallum, RN  Haynes Hoehn, RN  Matilde Sprang, RN   History of presenting illness:Please refer to HPI in the Detailed H&P      77 year old man with HTN, NICM, systolic heart failure, s/p ICD, CKD stage IV, DVT on Eliquis, CLL, presented to oncologist for routine visit and found to have severe anemia.  Hemoglobin 6 mg/dL.  Patient reported black stool.  Admitted for GI bleed.     Hospital Course:     cute blood loss anemia secondary to duodenal bleed  S/p 2 units of PRBC.  Last hemoglobin 7.7 mg/dL per  EGD by Dr. 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  Avoid NSAIDs.  Hold Eliquis for now.  Timing of resumption of anticoagulation deferred to GI.  Patient will discuss this with Dr. Jens Som as outpatient  Hematology to decide about IV iron on outpatient basis     Acute thrombocytopenia  Likely consumptive loss from duodenal bleed  PLT stable 109-120  Follow-up with hematology, Dr. Laurine Blazer     Acute on chronic systolic heart failure  S/p ICD for nonischemic cardiomyopathy  Clinical symptoms euvolemic  Continue Torsemide 80 mg daily      CKD stage IV  Creatinine stable 1.7.  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 Laurine Blazer  Patient advised to hold  Acalabrutinib in the setting of GI bleed until cleared by hematologist     History of DVT  Eliquis on hold     Essential hypertension  Continue metoprolol, Isordil, hydralazine, amiodarone     At time of discharge patient was in no acute distress, afebrile and hemodynamically stable.  Discharge plans and follow-up appointment discussed with the patient at bedside.  Patient will hold home dose of acalabrutinib and Eliquis in the setting of acute duodenal bleed.  Patient was discussed with hematology and GI [Dr. Crenshaw] respectively to decide as to when these medication should be resumed. All questions answered    Procedures/Radiology performed   Radiology reports have been reviewed:  No results found.   No results found.  Pathology:   Specimens (From admission, onward)       Start     Ordered    02/11/23 1158  No Pathology  RELEASE UPON ORDERING        References:    Lab Test Reference   Question:  Release to patient  Answer:  Immediate    02/11/23 1158                    Labs:   Labs have been reviewed:  Coagulation Profile:   Recent Labs  Lab 02/11/23  0839 02/10/23  1131   PT 15.3* 22.1*   PT INR 1.3* 1.9*   PTT  --  33       CBC review:   Recent Labs   Lab 02/12/23  0800 02/11/23  1852 02/11/23  0839 02/10/23  1946 02/10/23  1131   WBC 8.85  --  9.50  --  8.80   Hgb 8.3* 8.5* 7.7* 6.9* 6.3*   Hematocrit 26.0* 27.4* 25.2* 22.6* 20.7*   Platelets 102*  --  107*  --  130*   MCV 99.6*  --  105.9*  --  115.0*   RDW 25*  --  Unmeasured  --  17*   Neutrophils 42.1  --  42.4  --  50.2   Neutrophils Absolute 3.72  --  4.03  --  4.41   Lymphocytes Automated 55.0  --  54.2  --  45.9   Eosinophils Automated 0.6  --  1.1  --  0.7   Immature Granulocytes 0.3  --  0.5  --  0.3   Immature Granulocytes Absolute 0.03  --  0.05  --  0.03     Chem Review:  Recent Labs   Lab 02/12/23  0800 02/11/23  0839 02/10/23  1131   Sodium 140 141 136   Potassium 3.6 4.0 4.2   Chloride 106 105 102   CO2 25 25 22    BUN 33.0* 46.0* 62.0*    Creatinine 1.7* 1.9* 2.6*   Glucose 154* 178* 360*   Calcium 8.4 8.9 8.9   Bilirubin, Total 1.2 1.9* 0.7   AST (SGOT) 16 22 34   ALT 20 24 26    Alkaline Phosphatase 60 64 86     Results       Procedure Component Value Units Date/Time    Glucose Whole Blood - POCT [[id number]]  (Abnormal) Collected: 02/12/23 1212     Updated: 02/12/23 1218     Whole Blood Glucose POCT 197 mg/dL     Comprehensive metabolic panel [[id number]]  (Abnormal) Collected: 02/12/23 0800    Specimen: Blood Updated: 02/12/23 0843     Glucose 154 mg/dL      BUN 78.2 mg/dL      Creatinine 1.7 mg/dL      Calcium 8.4 mg/dL      Sodium 956 mEq/L      Potassium 3.6 mEq/L      Chloride 106 mEq/L      CO2 25 mEq/L      Anion Gap 9.0     eGFR 41.2 mL/min/1.73 m2      Protein, Total 4.9 g/dL      Albumin 2.9 g/dL      AST (SGOT) 16 U/L      ALT 20 U/L      Alkaline Phosphatase 60 U/L      Bilirubin, Total 1.2 mg/dL      Globulin 2.0 g/dL      Albumin/Globulin Ratio 1.5    Glucose Whole Blood - POCT [[id number]]  (Abnormal) Collected: 02/11/23 1353     Updated: 02/12/23 0842     Whole Blood Glucose POCT 205 mg/dL     CBC and differential [[id number]]  (Abnormal) Collected: 02/12/23 0800    Specimen: Blood Updated: 02/12/23 0832     WBC 8.85 x10 3/uL      Hgb 8.3 g/dL      Hematocrit 41.3 %      Platelets 102 x10 3/uL      RBC  2.61 x10 6/uL      MCV 99.6 fL      MCH 31.8 pg      MCHC 31.9 g/dL      RDW 25 %      MPV 11.8 fL      Instrument Absolute Neutrophil Count 3.72 x10 3/uL      Neutrophils 42.1 %      Lymphocytes Automated 55.0 %      Monocytes 1.9 %      Eosinophils Automated 0.6 %      Basophils Automated 0.1 %      Immature Granulocytes 0.3 %      Nucleated RBC 0.6 /100 WBC      Neutrophils Absolute 3.72 x10 3/uL      Lymphocytes Absolute Automated 4.87 x10 3/uL      Monocytes Absolute Automated 0.17 x10 3/uL      Eosinophils Absolute Automated 0.05 x10 3/uL      Basophils Absolute Automated 0.01 x10 3/uL      Immature Granulocytes Absolute 0.03 x10  3/uL      Absolute NRBC 0.05 x10 3/uL     Glucose Whole Blood - POCT [[id number]]  (Abnormal) Collected: 02/12/23 0745     Updated: 02/12/23 0750     Whole Blood Glucose POCT 148 mg/dL     Glucose Whole Blood - POCT [[id number]]  (Abnormal) Collected: 02/12/23 0401     Updated: 02/12/23 0403     Whole Blood Glucose POCT 145 mg/dL     Prepare / Crossmatch Red Blood Cells:  One Unit, 1 Units [[id number]] Collected: 02/10/23 1215     Updated: 02/12/23 0017     RBC Leukoreduced RBC Leukoreduced     BLUNIT [id number]     Status transfused     PRODUCT CODE (NON READABLE) E0336V00     Expiration Date [id number]     UTYPE O POS     ISBT CODE 5100    Glucose Whole Blood - POCT [[id number]]  (Abnormal) Collected: 02/11/23 2348     Updated: 02/11/23 2351     Whole Blood Glucose POCT 230 mg/dL     Glucose Whole Blood - POCT [[id number]]  (Abnormal) Collected: 02/11/23 2004     Updated: 02/11/23 2007     Whole Blood Glucose POCT 265 mg/dL     Hemoglobin and hematocrit, blood [[id number]]  (Abnormal) Collected: 02/11/23 1852     Updated: 02/11/23 1923     Hgb 8.5 g/dL      Hematocrit 16.0 %     Narrative:      HARD STICK RN AWARE 02/11/2023  17:32    Glucose Whole Blood - POCT [[id number]]  (Abnormal) Collected: 02/11/23 1713     Updated: 02/11/23 1718     Whole Blood Glucose POCT 157 mg/dL           Discharge medications:        Discharge Medication List        Taking      amiodarone 200 MG tablet  Dose: 200 mg  Commonly known as: PACERONE  Take 1 tablet (200 mg) by mouth daily     apixaban 5 MG  Dose: 5 mg  Commonly known as: ELIQUIS  Take 1 tablet (5 mg) by mouth every 12 (twelve) hours For 1 week followed by 1 tablet twice daily     atorvastatin 40 MG tablet  Dose: 40 mg  Commonly known as: LIPITOR  Take 1 tablet (40 mg total) by mouth  nightly     B-D UF III MINI PEN NEEDLES 31G X 5 MM Misc  Generic drug: Insulin Pen Needle  Use as directed daily     Calquence 100 MG capsule  Dose: 100 capsule  Generic drug: acalabrutinib  Take  100 capsules (10,000 mg) by mouth 2 (two) times daily     empagliflozin 10 MG tablet  Dose: 10 mg  Commonly known as: JARDIANCE  Start taking on: February 13, 2023  Take 1 tablet (10 mg) by mouth daily     FreeStyle Libre 3 Sensor Misc  Dose: 1 Device  Use 1 Device every 14 (fourteen) days     hydrALAZINE 100 MG tablet  Dose: 100 mg  Commonly known as: APRESOLINE  Take 1 tablet (100 mg) by mouth every 8 (eight) hours     insulin glargine 100 UNIT/ML injection  Dose: 33 Units  Commonly known as: LANTUS  Inject 33 Units into the skin every 12 (twelve) hours     insulin lispro (1 Unit Dial) 100 UNIT/ML pen-injector  Dose: 10 Units  Commonly known as: HumaLOG KwikPen  Inject 10 Units into the skin 3 (three) times daily before meals     isosorbide dinitrate 40 MG tablet  Dose: 40 mg  Commonly known as: ISORDIL  Take 1 tablet (40 mg) by mouth 3 (three) times daily     metoprolol succinate XL 25 MG 24 hr tablet  Dose: 25 mg  Commonly known as: TOPROL-XL  Take 1 tablet (25 mg) by mouth daily     niacin 500 MG CR tablet  Commonly known as: NIASPAN  TAKE 1 TABLET BY MOUTH TWICE DAILY     pantoprazole 40 MG tablet  Dose: 40 mg  Commonly known as: PROTONIX  Take 1 tablet (40 mg) by mouth 2 (two) times daily     spironolactone 25 MG tablet  Dose: 12.5 mg  Commonly known as: ALDACTONE  Start taking on: February 13, 2023  Take 0.5 tablets (12.5 mg) by mouth daily     Torsemide 40 MG Tabs  Dose: 80 mg  What changed: when to take this  Start taking on: February 13, 2023  Take 2 tablets (80 mg) by mouth daily              Vitals:     Vitals:    02/12/23 1220 02/12/23 1350 02/12/23 1447 02/12/23 1505   BP: 99/67 110/66 103/77 102/66   Pulse: 79 80 82 78   Resp:       Temp:       TempSrc:       SpO2:   98%    Weight:       Height:           Physical exam:   Physical Exam  HENT:      Head: Normocephalic and atraumatic.      Nose: Nose normal.      Mouth/Throat:      Mouth: Mucous membranes are moist.      Pharynx: Oropharynx is clear.   Eyes:       Extraocular Movements: Extraocular movements intact.      Conjunctiva/sclera: Conjunctivae normal.      Pupils: Pupils are equal, round, and reactive to light.   Cardiovascular:      Pulses: Normal pulses.      Heart sounds: Murmur heard.      Comments: S1, S2 NL,   Pulmonary:      Effort: Pulmonary effort  is normal.      Breath sounds: Normal breath sounds.   Abdominal:      General: Bowel sounds are normal.      Palpations: Abdomen is soft.   Musculoskeletal:         General: Normal range of motion.      Cervical back: Neck supple.      Right lower leg: Edema present.      Left lower leg: Edema present.   Skin:     General: Skin is dry.   Neurological:      General: No focal deficit present.      Mental Status: He is alert and oriented to person, place, and time.   Psychiatric:         Mood and Affect: Mood normal.         Behavior: Behavior normal.         Thought Content: Thought content normal.         Judgment: Judgment normal.          Discharge ROS:   Review of Systems   Constitutional: Negative.    HENT: Negative.  Negative for hearing loss.    Respiratory: Negative.     Cardiovascular:  Positive for leg swelling.   Gastrointestinal: Negative.    Genitourinary: Negative.    Musculoskeletal: Negative.    Neurological: Negative.    Psychiatric/Behavioral: Negative.          Code status:   Full Code    Condition at discharge:   Stable    Prognosis:   Good    Readmission risk:   High risk: Patient has multiple chronic medical conditions that predisposes him readmission to the hospital.    Disposition:   Home with home health    Discharge instructions:     Hold off on Eliquis until you discuss with your gastroenterologist as to when you should resume this medication  It is recommended that you avoid NSAIDs/blood thinners at this point given your recent duodenal bleed  Continue Protonix 40 mg by mouth twice daily  Please hold off on your home dose of Acalabrutinib until cleared by hematologist to resume  Resume  rest of medications as directed on the discharge instructions    Follow ups:      Follow-up Information       Zorita Pang, MD. Schedule an appointment as soon as possible for a visit.    Specialty: Family Medicine  Contact information:  [phone]  [place] [address]  [phone]               Aundria Mems, MD. Schedule an appointment as soon as possible for a visit.    Specialty: Cardiology  Contact information:  [address]  [address]  [place] [address]  [phone]               Doyne Keel, MD. Schedule an appointment as soon as possible for a visit.    Specialty: Gastroenterology  Contact information:  [phone] [address]  [place] [address]  [phone]                              Minutes spent coordinating discharge and reviewing discharge plan: 55 minutes    Kavin Leech, MD  02/12/23    CC: Zorita Pang, MD

## 2023-02-12 NOTE — Discharge Summary -  Nursing (Signed)
Reviewed discharge instructions with patient and spouse at bedside. Pt & family verbalized understanding, especially regarding holding Eliquis and Calquence until meeting with their outpatient providers. Removed Tele and Ivs. All belongings sent with pt. Transported pt to main entrance via wheelchair. Pt transported home via family car.

## 2023-02-12 NOTE — Progress Notes (Signed)
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 male who presented to the hospital on 02/10/2023.    Principal Problem:    GI bleed    Pt is followed by Dr Laurine Blazer with a history of CLL  He is on acalabrutinib since July 2023     Pt was seen in our office 4/24 and sent to ED with Hgb 6.1  Prior Hgb 4/10 = 9.4, Dec 2023 =12.9     Pt is due for outpatient IV iron for iron deficiency anemia      Subjective   No new complaints.       Physical Examination     Vitals:    02/12/23 1350   BP: 110/66   Pulse: 80   Resp:    Temp:    SpO2:      GENERAL:  Alert.  No acute distress.  PSYCHIATRIC:   Mood good.  Affect normal.  NECK:  Supple.    CHEST:  unlabored at rest  HEART:  Regular rate and rhythm.    ABDOMEN:  non-distended.    EXTREMITIES: No edema.      Medications     Current Facility-Administered Medications   Medication Dose Route Frequency    amiodarone  200 mg Oral Daily    atorvastatin  40 mg Oral QHS    empagliflozin  10 mg Oral Daily    hydrALAZINE  100 mg Oral Q8H SCH    insulin glargine  15 Units Subcutaneous QAM    insulin lispro  1-5 Units Subcutaneous Q4H SCH    isosorbide dinitrate  40 mg Oral TID - Nitrate Free Interval    metoprolol succinate XL  25 mg Oral Daily    niacin  500 mg Oral BID    pantoprazole  40 mg Intravenous BID    sodium phosphate  1 enema Rectal Once    spironolactone  12.5 mg Oral Daily    torsemide  80 mg Oral Daily       Data     Recent Labs    Lab 02/12/23  0800 02/11/23  1852 02/11/23  0839 02/10/23  1946 02/10/23  1131   WBC 8.85  --  9.50  --  8.80   Hgb 8.3* 8.5* 7.7* 6.9* 6.3*   Hematocrit 26.0* 27.4* 25.2* 22.6* 20.7*   Platelets 102*  --  107*  --  130*   MCV 99.6*  --  105.9*  --  115.0*   RDW 25*  --  Unmeasured  --  17*   Neutrophils 42.1  --  42.4  --  50.2   Neutrophils Absolute 3.72  --  4.03  --  4.41   Lymphocytes Automated 55.0  --  54.2  --  45.9   Eosinophils Automated 0.6  --  1.1  --  0.7   Immature  Granulocytes 0.3  --  0.5  --  0.3   Immature Granulocytes Absolute 0.03  --  0.05  --  0.03       Recent Labs   Lab 02/12/23  0800 02/11/23  0839 02/10/23  1131   Sodium 140 141 136   Potassium 3.6 4.0 4.2   Chloride 106 105 102   CO2 25 25 22    BUN 33.0* 46.0* 62.0*   Creatinine 1.7* 1.9* 2.6*   Glucose 154* 178* 360*   Calcium 8.4 8.9 8.9   Bilirubin, Total 1.2 1.9* 0.7   AST (SGOT) 16 22 34   ALT 20 24 26    Alkaline Phosphatase 60 64 86       Recent Labs   Lab 02/11/23  0839 02/10/23  1131   PTT  --  33   PT INR 1.3* 1.9*           [facility]   [address]  [place], [place] [address]  [phone]      [email or web address]

## 2023-02-12 NOTE — Progress Notes (Signed)
HEART CARDIOLOGY PROGRESS NOTE    [facility]  [facility] [address] PROGRESSIVE CARE  [address]  [place] [address]  Loc: [phone]    Date Time: 02/12/23 8:52 AM  Patient Name: [facility] Day: 2      Subjective/Cardiac Relevant Events:   Denies dyspnea.    No significant cardiac events    ASSESSMENT:   Patient is a 77 y.o. male with the following cardiology relevant diagnoses.       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  Class 4 on admission now 3.  GDMT limited by renal function    Echo 12/21/22:  LVEF 11%, severe MR, severe TR, PHTN 51  Acute anemia with Hgb 6.3 (Hgb last month 10.5) due to GIB  H/o cad with prior stents 2011. Nonspecific hsTrop abnormality NOT indicative of ACS.  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  Medical therapy limited by hypotension.  CHF nursing protocol with daily standing weights and strict monitoring of in/out fluid balance.  Low salt diet.  Daily monitoring of electrolyte levels with replacement of potassium to maintain levels 4-5 mEq/L and magnesium 2-3 mg/dL.  Fluid restriction 1.5-2L/24hrs  ACEIARB/ARNI: Consider if BP and renal function allows  BB: Toprol XL  MRA Antagonist: start aldactone 12.5mg  daily   SGLT2i: start jardiance 10mg  daily  Diuretic: Trial oral torsemide 80mg  bid. Need to ensure good diuresis before d/c      Physical Exam:   Patient  height is 1.829 m (6') and weight is 89 kg (196 lb 3.2 oz). His temporal temperature is 97.3 F (36.3 C). His blood pressure is  108/69 and his pulse is 77. His respiration is 18 and oxygen saturation is 100%.       01/02/2023 01/03/2023 01/04/2023 01/08/2023 02/10/2023 02/11/2023 02/12/2023   Weight Monitoring   Height    182.9 cm 182.9 cm    182.9 cm    182.9 cm 182.9 cm    Height Method     Stated Actual    Weight 84.369 kg 84.006 kg 84.732 kg 84.369 kg    84.369 kg 84.4 kg    84.4 kg    84.369 kg 88.451 kg 88.996 kg   Weight Method Standing Scale Standing Scale Standing Scale  Bed Scale Actual    Standing Scale Standing Scale   BMI (calculated) 25.2 kg/m2 25.1 kg/m2 25.3 kg/m2 25.2 kg/m2    25.2 kg/m2 25.2 kg/m2    25.2 kg/m2    25.2 kg/m2 26.4 kg/m2 26.6 kg/m2       Constitutional: Cooperative, alert, no acute distress.  Fatigued  Neck: JVP present  Cardiac: Regular rate and rhythm, systolic murmur  Pulmonary: clear, normal effort  Extremities: 1+ pretibial edema.  Vascular: +2 pulses in radial artery bilaterally    Telemetry/Laboratory/Intake-Output Reviewed:     Telemetry reviewed: vpaced, pvcs    Recent Labs     02/12/23  0800 02/11/23  0839 02/10/23  1946   Sodium 140   < >  --  Potassium 3.6   < >  --    Chloride 106   < >  --    CO2 25   < >  --    BUN 33.0*   < >  --    Creatinine 1.7*   < >  --    Calcium 8.4   < >  --    WBC 8.85   < >  --    Hgb 8.3*   < > 6.9*   Hematocrit 26.0*   < > 22.6*   Platelets 102*   < >  --    hs Troponin-I  --   --  39.1*   hs Troponin-I Delta  --   --  calc n/a   NT-proBNP  --   --  5,600*   AST (SGOT) 16   < >  --    ALT 20   < >  --     < > = values in this interval not displayed.         Intake/Output Summary (Last 24 hours) at 02/12/2023 0852  Last data filed at 02/12/2023 0000  Gross per 24 hour   Intake 1995.5 ml   Output 1300 ml   Net 695.5 ml       Medications:      Scheduled Meds: PRN Meds:    amiodarone, 200 mg, Oral, Daily  atorvastatin, 40 mg, Oral, QHS  furosemide, 80 mg, Intravenous, BID  hydrALAZINE, 100 mg, Oral, Q8H SCH  insulin glargine, 15 Units, Subcutaneous, QAM  insulin lispro, 1-5  Units, Subcutaneous, Q4H SCH  isosorbide dinitrate, 40 mg, Oral, TID - Nitrate Free Interval  metoprolol succinate XL, 25 mg, Oral, Daily  niacin, 500 mg, Oral, BID  pantoprazole, 40 mg, Intravenous, BID  sodium phosphate, 1 enema, Rectal, Once        Continuous Infusions:     sodium chloride, , PRN  sodium chloride, , PRN  acetaminophen, 650 mg, TID PRN  benzocaine-menthol, 1 lozenge, Q2H PRN  benzonatate, 100 mg, TID PRN  carboxymethylcellulose sodium, 1 drop, TID PRN  dextrose, 15 g of glucose, PRN   Or  dextrose, 12.5 g, PRN   Or  dextrose, 12.5 g, PRN   Or  glucagon (rDNA), 1 mg, PRN  naloxone, 0.2 mg, PRN  ondansetron, 4 mg, Q6H PRN   Or  ondansetron, 4 mg, Q6H PRN  saline, 2 spray, Q4H PRN            -------------------------------------------------------------------------------------      Signed by:     Aundria Mems, MD  [facility]    [facility] Contact Information   [facility]  Secure Chat (Group):   [facility] [place] [facility]    APP Spectralink:  [phone]    MD Spectralink :  [phone]  [phone]    After hours, non urgent consult line:  [phone]    After hours, physician on-call:  [phone] [facility]  Secure Chat (Group):   [facility] [place] [facility]    APP Spectralink:  [phone]    MD Spectralink :  [phone]      After hours, non urgent consult line:  [phone]    After hours, physician on-call:  [phone] [facility]  Secure Chat (Group):   [facility] [place] [facility]    APP Spectralink:  [phone]    MD Spectralink :  [phone]      After hours, non urgent consult line:  [phone]    After hours, physician on-call:  [phone] [facility]  Secure Chat (Group):   [place] [facility]    APP Spectralink:  [phone]    MD Spectralink :  [phone]      After hours, non urgent consult line:  [phone]    After hours, physician on-call:  [phone]       This note was generated by the Dragon speech recognition and may contain errors or omissions not intended by the user.  Grammatical errors, random word insertions, deletions, pronoun errors, and incomplete sentences are occasional consequences of this technology due to software limitations. Not all errors are caught or corrected. If there are questions or concerns about the content of this note or information contained within the body of this dictation, they should be addressed directly with the author for clarification.

## 2023-02-12 NOTE — Plan of Care (Signed)
Problem: Moderate/High Fall Risk Score >5  Goal: Patient will remain free of falls  Outcome: Progressing  Flowsheets  Taken 02/12/2023 0948  VH High Risk (Greater than 13): Use chair-pad alarm device  Taken 02/12/2023 0728  Moderate Risk (6-13):   MOD-Apply bed exit alarm if patient is confused   MOD-Floor mat at bedside (where available) if appropriate     Problem: Compromised Sensory Perception  Goal: Sensory Perception Interventions  Outcome: Progressing  Flowsheets (Taken 02/12/2023 0746)  Sensory Perception Interventions: Offload heels, Pad bony prominences, Reposition q 2hrs/turn Clock, Q2 hour skin assessment under devices if present     Problem: Compromised Moisture  Goal: Moisture level Interventions  Outcome: Progressing  Flowsheets (Taken 02/12/2023 0746)  Moisture level Interventions: Moisture wicking products, Moisture barrier cream     Problem: Compromised Activity/Mobility  Goal: Activity/Mobility Interventions  Outcome: Progressing  Flowsheets (Taken 02/12/2023 0746)  Activity/Mobility Interventions: Pad bony prominences, TAP Seated positioning system when OOB, Promote PMP, Reposition q 2 hrs / turn clock, Offload heels     Problem: Compromised Nutrition  Goal: Nutrition Interventions  Outcome: Progressing     Problem: Compromised Friction/Shear  Goal: Friction and Shear Interventions  Outcome: Progressing     Problem: Day of Admission - Heart Failure  Goal: Heart Failure Admission  Outcome: Progressing     Problem: Everyday - Heart Failure  Goal: Stable Vital Signs and Fluid Balance  Outcome: Progressing  Flowsheets (Taken 02/11/2023 2216 by Clotilde Dieter, RN)  Stable Vital Signs and Fluid Balance:   Assess for swelling/edema   Fluid Restriction   Monitor, assess vital signs and telemetry per policy   Daily Standing Weights in the morning using the same scale, after using the bathroom and before breadfast.  If unable to stand, zero the bed and use the bed scale   Monitor labs and report abnormalities  to physician   Wean oxygen as needed if appropriate   Strict Intake/Output  Goal: Mobility/Activity is Maintained at Optimal Level for Patient  Outcome: Progressing  Flowsheets (Taken 02/11/2023 2216 by Clotilde Dieter, RN)  Mobility/Activity is Maintained at Optimal Level for Patient:   Increase mobility as tolerated/progressive mobility protocol   Reposition patient every 2 hours and as needed unless able to reposition self   Assess for changes in respiratory status, level of consciousness and/or development of fatigue   Consult with Physical Therapy and/or Occupational Therapy   Perform active/passive ROM  Goal: Nutritional Intake is Adequate  Outcome: Progressing  Flowsheets (Taken 02/10/2023 1836 by Penelope Galas, RN)  Nutritional Intake is Adequate:   Cardiac diet-2 gm Sodium   Fluid Restricction if needed   Consult/Collaborate with Nutritionist   Assess appetite,anorexia and amount of meal/food tolerated   Patient and family teaching on low sodium diet   Encourage/perform oral hygiene as appropriate  Goal: Teaching-Using CHF Warning Zones and Educational Videos  Outcome: Progressing  Flowsheets (Taken 02/10/2023 1836 by Penelope Galas, RN)  Teaching-Using CHF Warning Zones and Educational Videos:   Signs & Symptoms of CHF   Sodium Restriction   Fluid Restriction if appropriate   Daily Standing Weights & record   Document in the Education Tab in EPIC with Teach-back   CHF Warning Zones and when to call for help   Medications     Problem: Altered GI Function  Goal: Fluid and electrolyte balance are achieved/maintained  Outcome: Progressing  Goal: Elimination patterns are normal or improving  Outcome: Progressing     Problem: Safety  Goal: Patient  will be free from injury during hospitalization  Outcome: Progressing  Goal: Patient will be free from infection during hospitalization  Outcome: Progressing

## 2023-02-12 NOTE — Discharge Instr - AVS First Page (Addendum)
Reason for your Hospital Admission:    Acute blood loss anemia due to duodenal bleeding  Acute on chronic systolic heart failure    Instructions for after your discharge:    Hold off on Eliquis until you discuss with your gastroenterologist as to when you should resume this medication  It is recommended that you avoid NSAIDs/blood thinners at this point given your recent duodenal bleed  Continue Protonix 40 mg by mouth twice daily  Please hold off on your home dose of Acalabrutinib until cleared by hematologist to resume  Resume rest of medications as directed on the discharge instructions

## 2023-02-13 NOTE — Progress Notes (Addendum)
The referral w/ FTF, H&P, and Grenelefe summary faxed to the agency.      Home Health Referral          Referral from Mechele Collin (Case Manager) for home health care upon discharge.    By [employer], the patient has the right to freely choose a home care provider.  Arrangements have been made with:    A company of the patients choosing. We have supplied the patient with a listing of providers in your area who asked to be included and participate in Medicare, or:  The preferred provider of your insurance company. Choosing a home care provider other than your insurance company's preferred provider may affect your insurance coverage.      Home Health Discharge Information    Your doctor has ordered Skilled Nursing, Physical Therapy, and Occupational Therapy in-home services for you while you recuperate at home, to assist you in the transition from hospital to home.      The agency that you or your representative chose to provide the service: [facility]( [facility])      Resumption of Care    For any scheduling concerns or questions related to home health, or if you have not heard from your home health agency within 24-48 hours after discharge, please contact your home health agency at the number listed above.       Home health services were set up by:  Tamela Gammon, RN  Weekend Post Acute Care Coordinator  [facility]  [place] [phone]/ [phone] (M-[phone] (weekend only)       Home Health face-to-face (FTF) Encounter (Order [id number])  Consult  Date: 02/13/2023 Department: [facility] [address] [facility] Ordering/Authorizing: Kavin Leech, MD     Order Information    Order Date/Time Release Date/Time Start Date/Time End Date/Time   02/13/23 11:36 AM None 02/13/23 11:33 AM 02/13/23 11:33 AM     Order Details    Frequency Duration Priority Order Class   Once 1  occurrence Routine Hospital Performed     Standing Order Information    Remaining  Occurrences Interval      1/1 Once               Provider Information    Ordering User Ordering Provider Authorizing Provider   Tamela Gammon, RN Kavin Leech, MD Kavin Leech, MD   Attending Provider(s) Admitting Provider PCP   Bonney Aid, MD; Raiford Noble, MD Felecia Jan, MD     Verbal Order Info    Action Created on Order Mode Entered by Responsible Provider Signed by Signed on   Ordering 02/13/23 1136 Telephone with Merlene Laughter, RN Kavin Leech, MD             Comments    Diagnosis:  Acute macrocytic anemia secondary to acute GI bleed  Chronic systolic heart failure  S/p ICD for nonischemic cardiomyopathy  Clinical symptoms euvolemic  CKD stage IV  History of DVT  Essential hypertension              Home nursing required for skilled assessment including cardiopulmonary assessment and dietary education for disease management, and medication instruction. Home PT/OT required for gait and balance training, strengthening, mobility, fall prevention, and ADL training.                Home Health face-to-face (FTF) Encounter: Patient Communication     Not  Released  Not seen         Order Questions    Question Answer   Date I saw the patient face-to-face: 02/13/2023   Evidence this patient is homebound because: C.  Decreased endurance, strength, ROM, cadence, safety/judgment during mobility    G.  Fall risk due to impaired coordination, gait and decreased balance   Medical conditions that necessitate Home Health care: B.  Functional impairment due to recent hospitalization/procedure/treatment    C.  Risk for complication/infection/pain requiring follow up and monitoring    E.  Exacerbation of disease requiring follow up monitoring   Per clinical findings, following services are medically necessary: Skilled Nursing    PT    OT   Clinical findings that support the need for Skilled Nursing. SN will: C. Monitor for signs and symptoms of exacerbation of  disease and management    D. Review medication reconciliation, manage and educate on use and side effects    H. Assess cardiopulmonary status and monitor for signs &symptoms of exacerbation    I.  Educate dietary and or fluid restrictions and weight management   Clinical findings that support the need for Physical Therapy. PT will A.  Evaluate and treat functional impairment and improve mobility    C.  Educate on weight bearing status, stair/gait training, balance & coordination    D.  Provide services to help restore function, mobility, and releive pain    E.  Educate on functional mobility; bed, chair, sit, stand and transfer activities    F.  Perform home safety assessment & develop safe in home exercise program    G.  Implement activities to improve stance time, cadence & step length    I.  Instruct on restorative activities to restore ability to perform ADL   OT will provide assistance with: Home program to improve ability to perform ADLs    Basic motor function and reasoning abilities    Strategies to compensate for loss of function   Other (please specify) See comments for further orders.                    Process Instructions    Please select Home Care Services medically necessary.    Based on the above findings, I certify that this patient is confined to the home and needs intermittent skilled nursing care, physical therapry and / or speech therapy or continues to need occupational therapy. The patient is under my care, and I have initiated the establishment of the plan of care. This patient will be followed by a physician who will periodically review the plan of care.     Collection Information            Consult Order Info    ID Description Priority Start Date Start Time   [id number] Home Health face-to-face (FTF) Encounter Routine 02/13/2023 11:33 AM   Provider Specialty Referred to   ______________________________________ _____________________________________                         Verbal Order  Info    Action Created on Order Mode Entered by Responsible Provider Signed by Signed on   Ordering 02/13/23 1136 Telephone with Merlene Laughter, RN Kavin Leech, MD             Patient Information    Patient Name  [name] [name], [name] [name] Legal Sex  Male DOB  04/13/1946       Reprint Order  Requisition    Home Health face-to-face (FTF) Encounter (Order #[id number]) on 02/13/23       Additional Information    Associated Reports External References   Priority and Order Details [employer]          [facility]        Patient Name: [name] [name], [name] [name]     MRN: [id number]      CSN: [id number]         Account Information     Hosp Acct #   [id number] Patient Class   Inpatient Service  Medicine Accommodation Code  Telemetry      Admission Information     Admitting Physician:  Attending Physician: Abram Sander, DO    Unit  LO [address] L&D Status      Admitting Diagnosis: GI bleed Room / Bed  N5210/N5210.A L&D - Last Menstrual Cycle      Chief Complaint: Abnormal Lab     Admit Type:  Admit Date/Time:  Discharge Date/Time: Emergency  02/10/2023 / 1138  02/12/2023 / 1800 Length of Stay: 2 Days    L&D EDD   Estimated Date of Delivery: None noted.      Patient Information              Home Address: [address]  [place] [address] Employer:  Employer Address:       ,     Main Phone: [phone] Employer Phone:     SSN: [id number]       DOB: 1946/09/16 (77 yrs)       Sex: Male Primary Care Physician: Zorita Pang, MD   Marital Status: Married Referring Physician:       No ref. provider found   Race: Black or African American       Ethnicity: Not of Hispanic/Latino/S*       Emergency Contacts  Name Home Phone Work Phone Mobile Phone Relationship MICHAELJOHN, BISS     [phone] Spouse           Guarantor Information     Guarantor Name: DORIEN, MAYOTTE Guarantor ID: [id number]   Guarantor Relationship to Pt: Self Guarantor Type: Personal/Family   Guarantor DOB:    05-Oct-1946 Billing Indicator:  Nym Unable to Code      Guarantor Address: [address]   [place], [place] [address]          Guarantor Home Phone: [phone] Guarantor Employer:        Guarantor Work Phone:   Pensions consultant Emp Phone:                     [employer] Name: [employer] POS Subscriber Name: [employer] Address:    [address]  [place], [place] [address] Subscriber DOB: 08/04/1954      Subscriber ID: [id number]   Insurance Phone:   Pt Relationship to Sub:   Spouse   Insurance ID:         Group Name:   Preauthorization #:     Group #: [id number] Preauthorization Days:        Tourist information centre manager Name: [employer] PART A ONLY Subscriber Name: [facility] [facility]   Insurance Address:    [address]  [place], [place] [address] Subscriber DOB: Jul 18, 1946      Subscriber ID: [id number]   Insurance Phone:   Pt Relationship to Sub:   Self   Insurance ID:  Group Name:   Preauthorization #:     Group #: PART A ONLY Preauthorization Days:        [employer] Name: - Subscriber Name:     Community education officer Address:       ,   Statistician DOB:        Subscriber ID:     Press photographer:   Pt Relationship to Sub:       Insurance ID:         Group Name:   Preauthorization #:     Group #:   Preauthorization Days:

## 2023-02-16 NOTE — Progress Notes (Signed)
AMBULATORY CARE MANAGEMENT Outreach Call    Hospital patient admitted to: [facility] Admission/Discharge Dates:   02/10/23 to 02/12/23     Reason for admission to hospital: GI bleed    Name: [name] [name]    ### Patient Details  Date of Birth: 23-Sep-1946  MRN: [id number]    ### Encounter Details  Arrival Date: 02/10/2023 11:38 AM EDT  Discharge Date: 02/12/2023 06:00 PM EDT  Encounter ID: [id number] TCM4/26/2024   6:00:00PM    ### Related interaction  [place] TCM Post Discharge Outreach (Post Discharge TCM Outreach 1) ([email or web address])    ### Required Interventions and Feedback     Call Status         Call Status:     No Attempt (edited by MF on 02/16/2023 08:20 AM EDT)    Comments::     Patient received an automated post-discharge outreach call. Voicemail left requesting return call. No further action needed at this time. (edited by MF on 02/16/2023 08:20 AM EDT)    Collene Mares BSN, RN, CCM  RN Case Manager II  Ambulatory Care Management  [address] [place], [place] [address]  Ph: [phone]

## 2023-02-23 ENCOUNTER — Encounter (INDEPENDENT_AMBULATORY_CARE_PROVIDER_SITE_OTHER): Payer: Self-pay | Admitting: Family Medicine

## 2023-02-24 ENCOUNTER — Encounter (INDEPENDENT_AMBULATORY_CARE_PROVIDER_SITE_OTHER): Payer: Self-pay | Admitting: Family Medicine

## 2023-02-26 ENCOUNTER — Encounter (INDEPENDENT_AMBULATORY_CARE_PROVIDER_SITE_OTHER): Payer: Self-pay

## 2023-02-26 ENCOUNTER — Ambulatory Visit (INDEPENDENT_AMBULATORY_CARE_PROVIDER_SITE_OTHER): Payer: Commercial Managed Care - POS

## 2023-02-26 VITALS — BP 122/60 | HR 69 | Ht 72.0 in | Wt 191.0 lb

## 2023-02-26 DIAGNOSIS — E782 Mixed hyperlipidemia: Secondary | ICD-10-CM

## 2023-02-26 DIAGNOSIS — I255 Ischemic cardiomyopathy: Secondary | ICD-10-CM

## 2023-02-26 DIAGNOSIS — I25810 Atherosclerosis of coronary artery bypass graft(s) without angina pectoris: Secondary | ICD-10-CM

## 2023-02-26 DIAGNOSIS — I5023 Acute on chronic systolic (congestive) heart failure: Secondary | ICD-10-CM

## 2023-02-26 DIAGNOSIS — I10 Essential (primary) hypertension: Secondary | ICD-10-CM

## 2023-02-26 MED ORDER — ATORVASTATIN CALCIUM 80 MG PO TABS
80.0000 mg | ORAL_TABLET | Freq: Every day | ORAL | 3 refills | Status: DC
Start: 2023-02-26 — End: 2023-04-19

## 2023-02-26 MED ORDER — HYDRALAZINE HCL 50 MG PO TABS
50.0000 mg | ORAL_TABLET | Freq: Three times a day (TID) | ORAL | Status: DC
Start: 2023-02-26 — End: 2023-04-19

## 2023-02-26 MED ORDER — ISOSORBIDE DINITRATE 40 MG PO TABS
40.0000 mg | ORAL_TABLET | Freq: Two times a day (BID) | ORAL | Status: DC
Start: 2023-02-26 — End: 2023-03-08

## 2023-02-26 MED ORDER — METOPROLOL SUCCINATE ER 25 MG PO TB24
25.0000 mg | ORAL_TABLET | Freq: Two times a day (BID) | ORAL | Status: DC
Start: 2023-02-26 — End: 2023-07-07

## 2023-02-26 NOTE — Patient Instructions (Addendum)
Increase Torsemide 80 mg to TWICE daily for next 3 days    Then decrease to Torsemide 80 mg in MORNING and 40 mg in AFTERNOON    BMP (lab) in 1 week          MANAGING HEART FAILURE USING "ZONES"  CHECK YOUR ZONE EVERY DAY  GREEN ZONE > ALL CLEAR  YELLOW ZONE > CAUTION  RED ZONE > EMERGENCY    GREEN ZONE  = ALL CLEAR  DESCRIPTION   No new or worsening shortness of breath.   No new or worsening swelling of your hands, abdomen, legs or ankles.   No weight gain.   No chest pain or tightness.   No decrease in your ability to maintain your activity level.    ACTION   Continue taking your medications as ordered.   Continue your daily weights.   Follow a low salt diet.   Keep all physician appointments and follow-ups.    YELLOW ZONE = CAUTION  DESCRIPTION   Weight gain of two pounds in a day.   Increased swelling of your hands, abdomen, legs, or ankles.   Increased in shortness of breath with activity.   Increase in the number of pillows needed to sleep at night.   New or more frequent chest pain or tightness.   New onset of dizziness or lightheadedness after standing up.    ACTION  Call your provider's office. Do not wait until you are in the red zone to call. Call even when only experiencing one symptom.       RED ZONE = EMERGENCY  DESCRIPTION   Unrelieved shortness of breath or shortness of breath at rest.   Unrelieved chest pain.   Wheezing or chest tightness at rest.   Need to sit in a chair to sleep.   Weight gain of more than three pounds in a day or five pounds in a week.   Confusion or fall related to dizziness or lightheadedness.      ACTION  CALL YOUR PROVIDER'S OFFICE RIGHT AWAY!  CALL 911 IF YOU:   Faint or pass out.   Become extremely short of breath or are unable to talk due to breathlessness.   Have severe chest pain.

## 2023-02-26 NOTE — Progress Notes (Signed)
[name] OFF[facility] PROGRESS NOTE    HRT [facility] OFFICE -CARDIOLOGY  [address]  [place] [address]  Dept: [phone]  Dept Fax: [phone]       Patient Name: [name] [name]    Date of Visit:  Feb 26, 2023  Date of Birth: 08-Aug-1946  AGE: 77 y.o.  Medical Record #: [id number]  Requesting Physician: Zorita Pang, MD      CHIEF COMPLAINT: Follow-up and Congestive Heart Failure      HISTORY OF PRESENT ILLNESS:    He is a pleasant 77 y.o. male who presents today for hospital follow-up.  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    Patient saw hematologist 4/24 and noted with drop in Hgb from 10.5 month prior to 6.3 and was sent to ED for transfusion.  Cardiology consulted for post-transfusion diuretics. D/c on Torsemide 80 mg QD.    Today, patient reports he has noted weight gain from 185 lbs gradually up to 194 lbs, similar to in office today. As well, notes worsening LE swelling. Denies any DOE or chest discomfort. He remains on Torsemide 80 mg QD w/ good UOP. Soft SBP at home in the 100s. Denies any lightheadedness or dizziness. No other cardiac concerns/complaints at this time. Recently f/u'd with heme/onc and started on supplemental Fe.    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 He has a past surgical history that includes duodenal ulcer (1973); orthopedic surgery (AGE 4); EGD (N/A, 08/15/2014); Circumcision (AGE 21); Fracture surgery (AGE 43); Hernia repair (AGE 21); COLONOSCOPY, DIAGNOSTIC (SCREENING) (2009); Tonsillectomy and adenoidectomy (1954); Cardiac defibrillator placement (12/2012); Cardiac pacemaker placement (2014); EGD (1975); EGD, COLONOSCOPY (N/A, 04/04/2018); ECHOCARDIOGRAM, TRANSTHORACIC (02/2016 11/2016 09/2017 12/2017); MPI nuclear study (06/2016); Correction hammer toe; BIV (11/10/2016); ECHOCARDIOGRAM, TRANSTHORACIC (02/2016,11/2016,12/20/2017); Cardiac catheterization (03/2010); Cardiac catheterization (12/2012); Right Heart Cath (Right, 09/09/2022); Right Heart Cath ONLY incl. CO and Sats (Right, 12/22/2022); Tunneled Cath Placement (Permcath) (N/A, 12/25/2022); Tunneled Cath Removal (Permcath) (Right, 01/04/2023); and EGD (02/11/2023).    ALLERGIES:   Allergies   Allergen Reactions    Allopurinol        MEDICATIONS:   Patient's current medications were reviewed. ONLY Cardiac medications were updated unless others were addressed in assessment and plan.    Current Outpatient Medications:     amiodarone (PACERONE) 200 MG tablet, Take 1 tablet (200 mg) by mouth daily, Disp: 90 tablet, Rfl: 3    Continuous Blood Gluc Sensor (FreeStyle Libre 3 Sensor) Misc, Use 1 Device every 14 (fourteen) days, Disp: 6 each, Rfl: 3    empagliflozin (JARDIANCE) 10 MG tablet, Take 1 tablet (10 mg) by mouth daily, Disp: 30 tablet, Rfl: 0    FeroSul 325 (65 Fe) MG tablet, Take 1 tablet (325 mg) by mouth daily, Disp: , Rfl:     insulin glargine 100 UNIT/ML injection, Inject 33 Units into the skin every 12 (twelve) hours (Patient  taking differently: Inject 33 Units into the skin nightly), Disp: 20 mL, Rfl: 3    insulin lispro, 1 Unit Dial, 100  UNIT/ML pen-injector, Inject 10 Units into the skin 3 (three) times daily before meals, Disp: 9 mL, Rfl: 0    Insulin Pen Needle (B-D UF III MINI PEN NEEDLES) 31G X 5 MM Misc, Use as directed daily, Disp: 200 each, Rfl: 3    metoprolol succinate XL (TOPROL-XL) 25 MG 24 hr tablet, Take 1 tablet (25 mg) by mouth daily (Patient taking differently: Take 1 tablet (25 mg) by mouth 2 (two) times daily), Disp: 90 tablet, Rfl: 3    niacin (NIASPAN) 500 MG CR tablet, TAKE 1 TABLET BY MOUTH TWICE DAILY, Disp: 180 tablet, Rfl: 3    pantoprazole (PROTONIX) 40 MG tablet, Take 1 tablet (40 mg) by mouth 2 (two) times daily, Disp: 60 tablet, Rfl: 0    spironolactone (ALDACTONE) 25 MG tablet, Take 0.5 tablets (12.5 mg) by mouth daily, Disp: 15 tablet, Rfl: 0    torsemide 40 MG Tab, Take 2 tablets (80 mg) by mouth daily, Disp: 60 tablet, Rfl: 0    acalabrutinib (Calquence) 100 MG capsule, Take 100 capsules (10,000 mg) by mouth 2 (two) times daily (Patient not taking: Reported on 02/26/2023), Disp: , Rfl:     atorvastatin (LIPITOR) 80 MG tablet, Take 1 tablet (80 mg) by mouth daily, Disp: 90 tablet, Rfl: 3    hydrALAZINE (APRESOLINE) 50 MG tablet, Take 1 tablet (50 mg) by mouth every 8 (eight) hours, Disp: , Rfl:     isosorbide dinitrate (ISORDIL) 40 MG tablet, Take 1 tablet (40 mg) by mouth 3 (three) times daily (Patient taking differently: Take 1 tablet (40 mg) by mouth 2 (two) times daily), Disp: 90 tablet, Rfl: 0     FAMILY HISTORY: family history includes Breast cancer in his mother; Coronary artery disease in his mother; Diabetes in his brother and mother; Heart attack in his father; Heart attack (age of onset: 71) in his mother; Hypertension in his father and mother.    SOCIAL HISTORY: He reports that he has never smoked. He has never used smokeless tobacco. He reports that he does not currently use alcohol. He reports that he does not use drugs.    PHYSICAL EXAMINATION    Visit Vitals  BP 122/60 (BP Site: Right arm, Patient  Position: Sitting, Cuff Size: Small)   Pulse 69   Ht 1.829 m (6')   Wt 86.6 kg (191 lb)   SpO2 97%   BMI 25.90 kg/m       Constitutional: Cooperative, alert, no acute distress.  Neck: No carotid bruits, ++JVD sitting  Cardiac: RRR, 2/6 SM  Pulmonary: CTAB  Abdomen: soft, non-distended, non-TTP  Extremities: 1-2+ BLE edema  Vascular: +2 pulses in radial artery bilaterally, 2+ pedal pulses bilaterally.    LABS REVIEWED:   Lab Results   Component Value Date    WBC 8.85 02/12/2023    HGB 8.3 (L) 02/12/2023    HCT 26.0 (L) 02/12/2023    PLT 102 (L) 02/12/2023     Lab Results   Component Value Date    GLU 154 (H) 02/12/2023    BUN 33.0 (H) 02/12/2023    CREAT 1.7 (H) 02/12/2023    NA 140 02/12/2023    K 3.6 02/12/2023    CL 106 02/12/2023    CO2 25 02/12/2023    AST 16 02/12/2023    ALT 20 02/12/2023     Lab  Results   Component Value Date    MG 2.0 01/04/2023    TSH 2.18 12/21/2022    HGBA1C 7.7 (H) 12/21/2022    BNP 5,600 (H) 02/10/2023     Lab Results   Component Value Date    CHOL 106 12/21/2022    TRIG 76 12/21/2022    HDL 28 (L) 12/21/2022    LDL 63 12/21/2022     IMPRESSION:   Admitted 01/2023 for acute anemia with Hgb 6.3 (10.5 month prior) d/t UGIB s/p endoclips  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  Class 2-3.    Echo 12/21/22:  LVEF 11%, severe MR, severe TR, PHTN 51  Wt gain from 185 lbs -> 194 lbs and worse LE edema  H/o cad with prior stents 2011. 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 Renaee Munda      RECOMMENDATIONS:  Acute on chronic HFrEF:  ACEIARB/ARNI: TBD, low BP, renal function  BB: Toprol XL 25 mg BID  MRA: Aldactone 12.5 mg QD  SGLT2i: Jardiance 10mg   daily  Diuretic: Torsemide 80 mg QD -> increase to 80 mg BID for next 3 days -> then 80 mg qAM / 40 mg qPM.  Isosorbide 40 mg BID + Hydralazine 100 mg -> hydralazine 50 mg TID to make room for diuresis  Supplemental Fe: Eventual IV Fe per heme/onc.  BMP and NTproBNP in 1 week.  F/u as planned with GI and PCP next week.  F/u as planned with Dr. Webb Silversmith on 5/24.                                               Orders Placed This Encounter   Procedures    Basic Metabolic Panel    NT-proBNP       Orders Placed This Encounter   Medications    atorvastatin (LIPITOR) 80 MG tablet     Sig: Take 1 tablet (80 mg) by mouth daily     Dispense:  90 tablet     Refill:  3    hydrALAZINE (APRESOLINE) 50 MG tablet     Sig: Take 1 tablet (50 mg) by mouth every 8 (eight) hours       SIGNED:    Adlee Paar Robyn Haber, PA          This note was generated by the Dragon speech recognition and may contain errors or omissions not intended by the user. Grammatical errors, random word insertions, deletions, pronoun errors, and incomplete sentences are occasional consequences of this technology due to software limitations. Not all errors are caught or corrected. If there are questions or concerns about the content of this note or information contained within the body of this dictation, they should be addressed directly with the author for clarification.

## 2023-03-02 ENCOUNTER — Ambulatory Visit (INDEPENDENT_AMBULATORY_CARE_PROVIDER_SITE_OTHER): Payer: Commercial Managed Care - POS | Admitting: Family Medicine

## 2023-03-02 ENCOUNTER — Other Ambulatory Visit (FREE_STANDING_LABORATORY_FACILITY): Payer: Commercial Managed Care - POS

## 2023-03-02 ENCOUNTER — Encounter (INDEPENDENT_AMBULATORY_CARE_PROVIDER_SITE_OTHER): Payer: Self-pay | Admitting: Family Medicine

## 2023-03-02 VITALS — BP 97/58 | HR 80 | Temp 98.2°F | Wt 196.0 lb

## 2023-03-02 DIAGNOSIS — K922 Gastrointestinal hemorrhage, unspecified: Secondary | ICD-10-CM

## 2023-03-02 DIAGNOSIS — Z794 Long term (current) use of insulin: Secondary | ICD-10-CM

## 2023-03-02 DIAGNOSIS — I5023 Acute on chronic systolic (congestive) heart failure: Secondary | ICD-10-CM

## 2023-03-02 DIAGNOSIS — I4729 Other ventricular tachycardia: Secondary | ICD-10-CM

## 2023-03-02 DIAGNOSIS — E118 Type 2 diabetes mellitus with unspecified complications: Secondary | ICD-10-CM

## 2023-03-02 DIAGNOSIS — I10 Essential (primary) hypertension: Secondary | ICD-10-CM

## 2023-03-02 DIAGNOSIS — I255 Ischemic cardiomyopathy: Secondary | ICD-10-CM

## 2023-03-02 LAB — BASIC METABOLIC PANEL
Anion Gap: 13 (ref 5.0–15.0)
BUN: 55 mg/dL — ABNORMAL HIGH (ref 9.0–28.0)
CO2: 24 mEq/L (ref 17–29)
Calcium: 9.3 mg/dL (ref 7.9–10.2)
Chloride: 104 mEq/L (ref 99–111)
Creatinine: 2.6 mg/dL — ABNORMAL HIGH (ref 0.5–1.5)
Glucose: 138 mg/dL — ABNORMAL HIGH (ref 70–100)
Potassium: 4.2 mEq/L (ref 3.5–5.3)
Sodium: 141 mEq/L (ref 135–145)
eGFR: 24.8 mL/min/{1.73_m2} — AB (ref >=60)

## 2023-03-02 LAB — HEMOLYSIS INDEX(SOFT): Hemolysis Index: 5 Index (ref 0–24)

## 2023-03-02 NOTE — Progress Notes (Signed)
Have you seen any specialists since your last visit with [place]?  Yes, Hospital. Upcoming nephrology and GI       The patient was informed that the following HM items are still outstanding:   colonoscopy and Tdap vaccine and advance directive

## 2023-03-02 NOTE — Progress Notes (Signed)
Subjective:      Date: 03/02/2023 2:43 PM   Patient ID: [name] [name] is a 77 y.o. male.    Chief Complaint:  Chief Complaint   Patient presents with    GI Bleeding     Pt was diagnosed for a GI bleed. Pt is feeling a little better but still has SOB. This is a post hospital visit        HPI:  HPI    HTN: Hypertension is well controlled at this point.  Patient is checking his blood pressure regularly at least 3-4 times a week.  Blood pressure at home is always in normal range.  Patient avoids adding salt and is doing regular cardiac exercise.  Average 105/86    DM : Patient is compliant with current medication.  Tries to exercise at least 3 times a week.  Watches type of diet and amount of carbohydrate intake.  Check lab work for diabetes regularly in our clinic.  Average  130    Patient is also following up on GI bleed.  I we will check his CBC to make sure that his hemoglobin hematocrit is within range.    Patient still complaining about shortness of breath but looking at his pulse ox is that he is in the perfect range.  His vital signs are all good and patient feels overall pretty well besides the shortness of breath that he has currently no other complaints.  He is still under treatment for CLL by Dr. Laurine Blazer and will do blood work for him tomorrow.  I hope that they can combine my blood work with his blood work    Problem List:  Patient Active Problem List   Diagnosis    Type 2 diabetes mellitus with complication, with long-term current use of insulin    Heart attack    CAD (coronary artery disease)    Non-ischemic cardiomyopathy    Acute exacerbation of CHF (congestive heart failure)    PUD (peptic ulcer disease)    Acute dyspnea    CHF (congestive heart failure)    Headaches due to old head injury    Essential hypertension    Syncope and collapse    AICD (automatic cardioverter/defibrillator) present    Cardiomyopathy    SOB (shortness of breath)    GERD (gastroesophageal reflux disease)    Hypertensive  heart disease without heart failure    Heart failure, unspecified    Nonischemic cardiomyopathy    Pure hypercholesterolemia    Chronic systolic congestive heart failure    Encounter for implantable defibrillator reprogramming or check    Episode of recurrent major depressive disorder, unspecified depression episode severity    Stage 3 chronic kidney disease, 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    Anxiety and depression    COPD (chronic obstructive pulmonary disease)    HLD (hyperlipidemia)    Sleep apnea, obstructive    Scrotal edema    Hydrocele, unspecified hydrocele type    Acute on chronic congestive heart failure, unspecified heart failure type    Acute on chronic right-sided heart failure    Ischemic cardiomyopathy    CLL (chronic lymphocytic leukemia)    SVT (supraventricular tachycardia)    Heart failure    Iron deficiency anemia secondary to inadequate dietary iron intake    Heart failure, unspecified HF chronicity, unspecified heart failure type    Encounter for removal of tunneled central venous catheter (CVC) with port    GI bleed  Iron deficiency anemia, unspecified iron deficiency anemia type       Current Medications:  Outpatient Medications Marked as Taking for the 03/02/23 encounter (Office Visit) with Zorita Pang, MD   Medication Sig Dispense Refill    amiodarone (PACERONE) 200 MG tablet Take 1 tablet (200 mg) by mouth daily 90 tablet 3    atorvastatin (LIPITOR) 80 MG tablet Take 1 tablet (80 mg) by mouth daily 90 tablet 3    Continuous Blood Gluc Sensor (FreeStyle Libre 3 Sensor) Misc Use 1 Device every 14 (fourteen) days 6 each 3    empagliflozin (JARDIANCE) 10 MG tablet Take 1 tablet (10 mg) by mouth daily 30 tablet 0    FeroSul 325 (65 Fe) MG tablet Take 1 tablet (325 mg) by mouth daily      hydrALAZINE (APRESOLINE) 50 MG tablet Take 1 tablet (50 mg) by mouth every 8 (eight) hours      insulin glargine 100 UNIT/ML injection Inject 33 Units into the skin every 12 (twelve) hours (Patient  taking differently: Inject 33 Units into the skin nightly) 20 mL 3    insulin lispro, 1 Unit Dial, 100 UNIT/ML pen-injector Inject 10 Units into the skin 3 (three) times daily before meals 9 mL 0    Insulin Pen Needle (B-D UF III MINI PEN NEEDLES) 31G X 5 MM Misc Use as directed daily 200 each 3    isosorbide dinitrate (ISORDIL) 40 MG tablet Take 1 tablet (40 mg) by mouth 2 (two) times daily      metoprolol succinate XL (TOPROL-XL) 25 MG 24 hr tablet Take 1 tablet (25 mg) by mouth 2 (two) times daily      niacin (NIASPAN) 500 MG CR tablet TAKE 1 TABLET BY MOUTH TWICE DAILY 180 tablet 3    pantoprazole (PROTONIX) 40 MG tablet Take 1 tablet (40 mg) by mouth 2 (two) times daily 60 tablet 0    spironolactone (ALDACTONE) 25 MG tablet Take 0.5 tablets (12.5 mg) by mouth daily 15 tablet 0    torsemide 40 MG Tab Take 2 tablets (80 mg) by mouth daily 60 tablet 0       Allergies:  Allergies   Allergen Reactions    Allopurinol        Past Medical History:  Past Medical History:   Diagnosis Date    Acute CHF     NOV  & DEC 2018, - DIALYSIS CATH PLACED Aug 24 2017 TO REMOVE FLUID     Acute systolic (congestive) heart failure 08/2017    Anxiety     CAD (coronary artery disease)     Cardiomyopathy     nonischemic    CHF (congestive heart failure) 08/12/2014, 2013    Chronic obstructive pulmonary disease     POSSIBLE PER PT HE USES  SYNBICORT BID ABD  BREO INHALER PRN    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    Coronary artery disease 2011    CORONARY STENT PLACEMENT FOLLOWED BY [facility]    Depression     echocardiogram 02/2016, 11/2016, 09/2017, 12/20/2017    GERD (gastroesophageal reflux disease)     Heart attack 2011    and 2014    Hyperlipemia     Hypertension     ICD (implantable 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     PLACED 2014  [facility]  LAST INTERROGATION April 01 2018 REPORT REQUESTED    Ischemic cardiomyopathy     EF 15% ON ECHO 09-20-2017 DR Eben Burow IN EPIC  Nonischemic cardiomyopathy     NSTEMI (non-ST elevated  myocardial infarction)     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    Pacemaker 2014    MEDTRONIC ICD/PACEMAKER COMBO LAST INTERROGATION  12-12 2018    Pneumonia 09/2016    Primary cardiomyopathy     Ischemic cardiomyopathy EF 15% ON ECHO 09-20-2017 DR GARG IN EPIC    Sepsis 08/2017    Syncope and collapse     PRIOR TO ICD/PACEMAKER PLACED 2014    Type 2 diabetes mellitus, controlled DX 1998    BS  AVG 100  A1C 7.4 Dec 27 2017    Wheeze     PT SEE HIS PMD April 01 2018 RE THIS-TOLD TO SEE PMD BY [facility] JUNE 12 WHEN THEY SAW HIM FOR A CL       Past Surgical History:  Past Surgical History:   Procedure Laterality Date    BIV  11/10/2016    ICD METRONIC  UPGRADE     CARDIAC CATHETERIZATION  03/2010    CORONARY STENT PLACEMENT X 2 PER PT; LM normal, LAD 70-80% distal lesion at apex, 20% lesion mid CFX    CARDIAC CATHETERIZATION  12/2012    CARDIAC DEFIBRILLATOR PLACEMENT  12/2012    [facility]    CARDIAC PACEMAKER PLACEMENT  2014    MEDTRONIC ICD/PACEMAKER PLACED [facility]    CIRCUMCISION  AGE 61    COLONOSCOPY, DIAGNOSTIC (SCREENING)  2009    CORRECTION HAMMER TOE      duodenal ulcer  1973    STRESS RELATED IN SCHOOL    ECHOCARDIOGRAM, TRANSTHORACIC  02/2016 11/2016 09/2017 12/2017    ECHOCARDIOGRAM, TRANSTHORACIC  02/2016,11/2016,12/20/2017    EGD N/A 08/15/2014    Procedure: EGD;  Surgeon: Colon Branch, MD;  Location: [facility];  Service: Gastroenterology;  Laterality: N/A;  egd w/ bx    EGD  1975    EGD  02/11/2023    Procedure: EGD and clip placement;  Surgeon: Doyne Keel, MD;  Location: [facility];  Service: Gastroenterology;;    EGD, COLONOSCOPY N/A 04/04/2018    Procedure: EGD with bxs, COLONOSCOPY with polypectomy and clipping;  Surgeon: Doyne Keel, MD;  Location: [facility];  Service: Gastroenterology;  Laterality: N/A;    FRACTURE SURGERY  AGE 40     RT  ANKLE- CAST APPLIED    HERNIA REPAIR  AGE 61    LEFT INGUINAL HERNIA    MPI nuclear study  06/2016    orthopedic surgery  AGE 8    RT  foot corrective - [facility]MERTOE    RIGHT HEART CATH Right 09/09/2022    Procedure: Right Heart Cath;  Surgeon: Burna Forts, MD;  Location: [facility];  Service: Cardiovascular;  Laterality: Right;  tx from FO    RIGHT HEART CATH ONLY INCL. CO AND SATS Right 12/22/2022    Procedure: Right Heart Cath ONLY incl. CO and Sats;  Surgeon: Day, Bertram Millard, MD;  Location: [facility];  Service: Cardiovascular;  Laterality: Right;  Right Heart Cath with leave in PA cath for milrinone initiation    TONSILLECTOMY AND ADENOIDECTOMY  1954    TUNNELED CATH PLACEMENT (PERMCATH) N/A 12/25/2022    Procedure: [facility];  Surgeon: Tamala Bari, MD;  Location: [facility];  Service: Interventional Radiology;  Laterality: N/A;    TUNNELED CATH REMOVAL (PERMCATH) Right 01/04/2023    Procedure: TUNNELED CATH REMOVAL;  Surgeon: Rex Kras, MD;  [facility];  Service: Interventional Radiology;  Laterality: Right;       Family History:  Family History   Problem Relation Age of Onset    Breast cancer Mother     Heart attack Mother 57    Hypertension Mother     Diabetes Mother     Coronary artery disease Mother     Diabetes Brother     Heart attack Father     Hypertension Father        Social History:  Social History     Socioeconomic History    Marital status: Married     Spouse name: Teacher, music    Number of children: 0   Occupational History    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, history    Tobacco Use    Smoking status: Never    Smokeless tobacco: Never   Vaping Use    Vaping status: Never Used   Substance and Sexual Activity    Alcohol use: Not Currently    Drug use: No    Sexual activity: Not Currently     Partners: Female     Social Determinants of Health     Financial Resource Strain: Low Risk  (01/17/2023)    Overall Financial Resource Strain (CARDIA)     Difficulty of Paying Living Expenses: Not hard at all   Food Insecurity: No Food Insecurity (02/10/2023)    Hunger Vital Sign     Worried About Running Out of Food in  the Last Year: Never true     Ran Out of Food in the Last Year: Never true   Transportation Needs: No Transportation Needs (02/10/2023)    PRAPARE - Therapist, art (Medical): No     Lack of Transportation (Non-Medical): No   Physical Activity: Insufficiently Active (01/17/2023)    Exercise Vital Sign     Days of Exercise per Week: 1 day     Minutes of Exercise per Session: 10 min   Stress: Stress Concern Present (01/17/2023)    [employer] of Occupational Health - Occupational Stress Questionnaire     Feeling of Stress : To some extent   Social Connections: Moderately Integrated (01/17/2023)    Social Connection and Isolation Panel [NHANES]     Frequency of Communication with Friends and Family: More than three times a week     Frequency of Social Gatherings with Friends and Family: Once a week     Attends Religious Services: More than 4 times per year     Active Member of [employer] or Organizations: No     Attends Banker Meetings: Never     Marital Status: Married   Catering manager Violence: Not At Risk (02/10/2023)    Humiliation, Afraid, Rape, and Kick questionnaire     Fear of Current or Ex-Partner: No     Emotionally Abused: No     Physically Abused: No     Sexually Abused: No   Housing Stability: Low Risk  (02/10/2023)    Housing Stability Vital Sign     Unable to Pay for Housing in the Last Year: No     Number of Places Lived in the Last Year: 2     Unstable Housing in the Last Year: No        The following sections were reviewed this encounter by the provider:   Tobacco  Allergies  Meds  Problems  Med Hx  Surg Hx  Fam Hx  Vitals:  BP 97/58 (BP Site: Right arm, Patient Position: Sitting, Cuff Size: Medium)   Pulse 80   Temp 98.2 F (36.8 C) (Temporal)   Wt 88.9 kg (196 lb)   SpO2 98%   BMI 26.58 kg/m     ROS:  Constitutional: Negative for fever, chills and malaise/fatigue.   Respiratory: Negative for cough, shortness of breath and wheezing.     Cardiovascular: Negative for chest pain and palpitations.   Gastrointestinal: Negative for abdominal pain, diarrhea and constipation.   Musculoskeletal: Negative for joint pain.   Skin: Negative for rash.   Neurological: Negative for dizziness and headaches.   Psychiatric/Behavioral: The patient does not have insomnia.         Objective:     Physical Exam:  Constitutional: Patient is oriented to person, place, and time. Appears well-developed and well-nourished.   HENT:   Head: Normocephalic and atraumatic.   Eyes: Conjunctivae and EOM are normal. Pupils are equal, round, and reactive to light.   Cardiovascular: Normal rate, regular rhythm and normal heart sounds.  Exam reveals no gallop and no friction rub.    No murmur heard.  Pulmonary/Chest: Effort normal and breath sounds normal. Has no wheezes.   Musculoskeletal: Normal range of motion. Exhibits no edema or tenderness.   Neurological: Patient is alert and oriented to person, place, and time.   Psychiatric: Patient has a normal mood and affect.        Assessment/Plan:         1. Ischemic cardiomyopathy    2. Essential hypertension  - CBC and differential; Future  - Comprehensive metabolic panel; Future  Patient symptoms are well controlled under current treatment.  Recommend to continue medication as prescribed.    3. Type 2 diabetes mellitus with complication, with long-term current use of insulin  - Lipid panel; Future  - Hemoglobin A1C; Future  - Urine Microalbumin Random; Future  Patient symptoms are well controlled under current treatment.  Recommend to continue medication as prescribed.    4. Paroxysmal ventricular tachycardia  Currently well-controlled    5. Gastric bleed  Patient is following up after hospitalization for this problem.  Patient tells me that he has not done a CBC since months.  I will recheck the CBC considering that vital signs are all stable I do not expect that is there is still some bleeding going on.  Considering that the  hematologist sees patient for his CLL this week I would mainly check on the hypertension and the diabetes.        Zorita Pang, MD

## 2023-03-03 ENCOUNTER — Encounter (INDEPENDENT_AMBULATORY_CARE_PROVIDER_SITE_OTHER): Payer: Self-pay | Admitting: Family Medicine

## 2023-03-03 LAB — NT-PROBNP: NT-proBNP: 6423 pg/mL — ABNORMAL HIGH (ref 0–450)

## 2023-03-05 ENCOUNTER — Encounter (INDEPENDENT_AMBULATORY_CARE_PROVIDER_SITE_OTHER): Payer: Self-pay | Admitting: Family Medicine

## 2023-03-05 ENCOUNTER — Other Ambulatory Visit: Payer: Self-pay | Admitting: Registered Nurse

## 2023-03-05 DIAGNOSIS — C9110 Chronic lymphocytic leukemia of B-cell type not having achieved remission: Secondary | ICD-10-CM

## 2023-03-08 ENCOUNTER — Encounter (INDEPENDENT_AMBULATORY_CARE_PROVIDER_SITE_OTHER): Payer: Self-pay | Admitting: Family Medicine

## 2023-03-08 ENCOUNTER — Other Ambulatory Visit (INDEPENDENT_AMBULATORY_CARE_PROVIDER_SITE_OTHER): Payer: Self-pay

## 2023-03-08 DIAGNOSIS — I5023 Acute on chronic systolic (congestive) heart failure: Secondary | ICD-10-CM

## 2023-03-09 ENCOUNTER — Telehealth (INDEPENDENT_AMBULATORY_CARE_PROVIDER_SITE_OTHER): Payer: Self-pay | Admitting: Family Medicine

## 2023-03-09 ENCOUNTER — Encounter (INDEPENDENT_AMBULATORY_CARE_PROVIDER_SITE_OTHER): Payer: Self-pay | Admitting: Family Medicine

## 2023-03-09 ENCOUNTER — Telehealth (INDEPENDENT_AMBULATORY_CARE_PROVIDER_SITE_OTHER): Payer: Self-pay

## 2023-03-09 DIAGNOSIS — I5023 Acute on chronic systolic (congestive) heart failure: Secondary | ICD-10-CM

## 2023-03-09 MED ORDER — ISOSORBIDE DINITRATE 40 MG PO TABS
40.0000 mg | ORAL_TABLET | Freq: Two times a day (BID) | ORAL | 3 refills | Status: DC
Start: 2023-03-09 — End: 2023-07-07

## 2023-03-09 NOTE — Telephone Encounter (Addendum)
-----   Message from Tre Robyn Haber, PA sent at 03/03/2023  4:17 PM EDT -----  Noted any weight loss with Torsemide?    Given AKI, reduce Torsemide to 80 mg QD and ensure water intake of 48-67 oz /day please.    Repeat BMP in 1 week prior to seeing Dr. Webb Silversmith if able as well.      Spoke with [name] [name], he advised the fluid is gone and his weight is down to 186lbs. Advised, per Tre Loletha Grayer PA, to reduce Torsemide to 80mg  QD and repeat BMP before OV with Dr Webb Silversmith on Friday 5/24. Pt v/u and will follow up as scheduled.

## 2023-03-09 NOTE — Telephone Encounter (Signed)
[facility] sent a letter for the provider to sign. Please fax letter back once signed.   Their call back number is [phone].   Thank you

## 2023-03-10 ENCOUNTER — Other Ambulatory Visit (FREE_STANDING_LABORATORY_FACILITY): Payer: Commercial Managed Care - POS

## 2023-03-10 DIAGNOSIS — I5023 Acute on chronic systolic (congestive) heart failure: Secondary | ICD-10-CM

## 2023-03-10 LAB — BASIC METABOLIC PANEL
Anion Gap: 12 (ref 5.0–15.0)
BUN: 66 mg/dL — ABNORMAL HIGH (ref 9.0–28.0)
CO2: 21 mEq/L (ref 17–29)
Calcium: 9 mg/dL (ref 7.9–10.2)
Chloride: 105 mEq/L (ref 99–111)
Creatinine: 3 mg/dL — ABNORMAL HIGH (ref 0.5–1.5)
Glucose: 402 mg/dL — ABNORMAL HIGH (ref 70–100)
Potassium: 4.6 mEq/L (ref 3.5–5.3)
Sodium: 138 mEq/L (ref 135–145)
eGFR: 20.8 mL/min/{1.73_m2} — AB (ref >=60)

## 2023-03-10 LAB — HEMOLYSIS INDEX(SOFT): Hemolysis Index: 3 Index (ref 0–24)

## 2023-03-10 NOTE — Telephone Encounter (Signed)
Called [facility] and left voicemail with callback number. Wanted more information on which letter need to be signed and sent since we have any papers sent on behalf of this Pt. Wanted to see if it needs to be resent to [facility] since Dr. Altamease Oiler already signed or if [facility] needs to fax the letter to [place] again

## 2023-03-12 ENCOUNTER — Telehealth (INDEPENDENT_AMBULATORY_CARE_PROVIDER_SITE_OTHER): Payer: Self-pay

## 2023-03-12 ENCOUNTER — Ambulatory Visit (INDEPENDENT_AMBULATORY_CARE_PROVIDER_SITE_OTHER): Payer: Commercial Managed Care - POS | Admitting: Cardiovascular Disease

## 2023-03-12 ENCOUNTER — Encounter (INDEPENDENT_AMBULATORY_CARE_PROVIDER_SITE_OTHER): Payer: Self-pay | Admitting: Cardiovascular Disease

## 2023-03-12 VITALS — BP 122/88 | HR 84 | Ht 72.0 in | Wt 192.0 lb

## 2023-03-12 DIAGNOSIS — I428 Other cardiomyopathies: Secondary | ICD-10-CM

## 2023-03-12 DIAGNOSIS — I5023 Acute on chronic systolic (congestive) heart failure: Secondary | ICD-10-CM

## 2023-03-12 DIAGNOSIS — I340 Nonrheumatic mitral (valve) insufficiency: Secondary | ICD-10-CM

## 2023-03-12 NOTE — Telephone Encounter (Signed)
Tried to reach pt and spouse (HIPAA) to check in after pt missed appt this morning with Dr Webb Silversmith and to reschedule. Lvm on both numbers to call back.

## 2023-03-12 NOTE — Progress Notes (Addendum)
[facility] ADVANCED HEART FAILURE OFFICE PROGRESS NOTE    HRT [facility] OFFICE -CARDIOLOGY  [address]  [place] [address]  Dept: [phone]  Dept Fax: [phone]       Patient Name: [name] [name]    Date of Visit:  Mar 12, 2023  Date of Birth: [birth year]  AGE: 77 y.o.  Medical Record #: [id number]      CHIEF COMPLAINT:    Chief Complaint   Patient presents with    Follow-up    Non-ischemic cardiomyopathy       HISTORY OF PRESENT ILLNESS:    I had the pleasure of seeing [name] today in follow up in the Advanced Heart Failure Clinic at [facility]. He is a pleasant 77 y.o. male who has chronic systolic HF and nonischemic cardiomyopathy with EF 25-30% by echo in August 2022 and EF 21 % by echo in November 2023 when he was admitted for symptoms related to HF.  His most recent echocardiogram demonstrates an LVEF of 11%.  He was admitted to [facility] in February and March 2024 due to worsening heart failure.  He spent time in the ICU on inotropes and unfortunately his renal function did not improve.   He has completed LVAD evaluation and unfortunately he has not a candidate due to worsening renal function.    He has a Medtronic ICD in place.    Dr. Laurine Blazer is his oncologist for management of CLL.    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 At this moment from a CLL standpoint, he is quite stable.  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  He can follow-up with Dr. Webb Silversmith as an outpatient  Appears euvolemic. Continue torsemide dosing as per nephrology  Continue rest of CV meds with aspirin, statin, hydralazine/Isordil, amiodarone 200 mg daily (failed DCCV in CICU; nsvt last few days), and torsemide  Goals of care discussion and palliative care consulted  Long discussion re: HF education with salt and fluid restriction. He would benefit from further discussion with HF RN navigator tomorrow if here.  Discharge planning for possibly today v tomorrow.  I will  order close follow-up with Dr. Webb Silversmith and his NP, Conception Oms  See below    PAST MEDICAL HISTORY: He has a past medical history of Acute CHF, Acute systolic (congestive) heart failure (08/2017), Anxiety, CAD (coronary artery disease), Cardiomyopathy, CHF (congestive heart failure) (08/12/2014, 2013), Chronic obstructive pulmonary disease, CLL (chronic lymphocytic leukemia) (10/26/2022), Coronary artery disease (2011), Depression, echocardiogram (02/2016, 11/2016, 09/2017, 12/20/2017), GERD (gastroesophageal reflux disease), Heart attack (2011), Hyperlipemia, Hypertension, ICD (implantable 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,  Ischemic cardiomyopathy, Nonischemic cardiomyopathy, NSTEMI (non-ST elevated myocardial infarction), 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 He has a past surgical history that includes duodenal ulcer (1973); orthopedic surgery (AGE 41); EGD (N/A, 08/15/2014); Circumcision (AGE 2); Fracture surgery (AGE 48); Hernia repair (AGE 2); COLONOSCOPY, DIAGNOSTIC (SCREENING) (2009); Tonsillectomy and adenoidectomy (1954); Cardiac defibrillator placement (12/2012); Cardiac pacemaker placement (2014); EGD (1975); EGD, COLONOSCOPY (N/A, 04/04/2018); ECHOCARDIOGRAM, TRANSTHORACIC (02/2016 11/2016 09/2017 12/2017); MPI nuclear study (06/2016); Correction hammer toe; BIV (11/10/2016); ECHOCARDIOGRAM, TRANSTHORACIC (02/2016,11/2016,12/20/2017); Cardiac catheterization (03/2010); Cardiac catheterization (12/2012); Right Heart Cath (Right, 09/09/2022); Right Heart Cath ONLY incl. CO and Sats (Right, 12/22/2022); Tunneled Cath Placement (Permcath) (N/A, 12/25/2022); Tunneled Cath Removal (Permcath) (Right, 01/04/2023); and EGD (02/11/2023).    ALLERGIES:   Allergies   Allergen Reactions    Allopurinol        MEDICATIONS:   Current Outpatient Medications:     amiodarone (PACERONE) 200 MG tablet, Take 1 tablet (200 mg) by mouth daily, Disp: 90 tablet, Rfl: 3    atorvastatin (LIPITOR) 80 MG tablet, Take 1 tablet (80 mg) by mouth daily, Disp: 90 tablet, Rfl: 3    Continuous Blood Gluc Sensor (FreeStyle Libre 3 Sensor) Misc, Use 1 Device every 14 (fourteen) days, Disp: 6 each, Rfl: 3    empagliflozin (JARDIANCE) 10 MG tablet, Take 1 tablet (10 mg) by mouth daily, Disp: 30 tablet, Rfl: 0    FeroSul 325 (65 Fe) MG tablet, Take 1 tablet (325 mg) by mouth daily, Disp: , Rfl:     hydrALAZINE (APRESOLINE) 50 MG tablet, Take 1 tablet (50 mg) by mouth every 8 (eight) hours, Disp: , Rfl:     insulin glargine 100  UNIT/ML injection, Inject 33 Units into the skin every 12 (twelve) hours (Patient taking differently: Inject 33 Units into the skin nightly), Disp: 20 mL, Rfl: 3    insulin lispro, 1 Unit Dial, 100 UNIT/ML pen-injector, Inject 10 Units into the skin 3 (three) times daily before meals, Disp: 9 mL, Rfl: 0    Insulin Pen Needle (B-D UF III MINI PEN NEEDLES) 31G X 5 MM Misc, Use as directed daily, Disp: 200 each, Rfl: 3    isosorbide dinitrate (ISORDIL) 40 MG tablet, Take 1 tablet (40 mg) by mouth 2 (two) times daily, Disp: 180 tablet, Rfl: 3    metoprolol succinate XL (TOPROL-XL) 25 MG 24 hr tablet, Take 1 tablet (25 mg) by mouth 2 (two) times daily, Disp: , Rfl:     niacin (NIASPAN) 500 MG CR tablet, TAKE 1 TABLET BY MOUTH TWICE DAILY, Disp: 180 tablet, Rfl: 3    pantoprazole (PROTONIX) 40 MG tablet, Take 1 tablet (40 mg) by mouth 2 (two) times daily, Disp: 60 tablet, Rfl: 0    spironolactone (ALDACTONE) 25 MG tablet, Take 0.5 tablets (12.5 mg) by mouth daily, Disp: 15 tablet, Rfl: 0    torsemide 40 MG Tab, Take 2 tablets (80 mg) by mouth daily, Disp:  60 tablet, Rfl: 0     FAMILY HISTORY: family history includes Breast cancer in his mother; Coronary artery disease in his mother; Diabetes in his brother and mother; Heart attack in his father; Heart attack (age of onset: 77) in his mother; Hypertension in his father and mother.    SOCIAL HISTORY: He reports that he has never smoked. He has never used smokeless tobacco. He reports that he does not currently use alcohol. He reports that he does not use drugs.      PHYSICAL EXAMINATION    Visit Vitals  BP 122/88 (BP Site: Left arm, Patient Position: Sitting, Cuff Size: Medium)   Pulse 84   Ht 1.829 m (6')   Wt 87.1 kg (192 lb)   SpO2 97%   BMI 26.04 kg/m           Last 3 Weights:       02/26/2023     2:33 PM 03/02/2023     1:58 PM 03/12/2023     2:09 PM   Weight Monitoring   Height 182.9 cm  182.9 cm   Weight 86.637 kg 88.905 kg 87.091 kg   BMI (calculated) 25.9 kg/m2  26  kg/m2         General Appearance:  Cooperative and in no acute distress.    Skin: Warm and dry to touch  Head: Normocephalic  Eyes: Conjunctivae and lids unremarkable.    Neck: No JVD  Chest: Clear to auscultation bilaterally with good respiratory effort and no wheezes, rales, or rhonchi   Cardiovascular: Regular rhythm, S1 normal, S2 normal, 1/6 holosystolic murmur in the mid axillary   Abdomen: Soft, nontender with normoactive bowel sounds.   Extremities: Warm/dry without edema, +2 b/l radial pulses  Neuro: Alert and oriented x3. No gross motor or sensory deficits noted, affect appropriate.    TTE, 09/08/2022:    * Markedly dilated left ventricle with severely reduced systolic function  (LVEDVi 109 mL/m2, ejection fraction 21%).    * Hypertrabeculated left ventricle with severe global hypokinesis.    * Mild aortic valve and moderate mitral valve regurgitation (details below).    * Wire in right-sided chambers with mild to moderate (2+) peri-device  tricuspid valve regurgitation.    * Dilated, poorly responsive IVC.    * When visually compared with the prior study dated 05/29/2021, LV function  is less vigorous (previous EF 31%, now 21%; previous LVOT VTI 17 cm, now 11  cm); valvular regurgitation is less prominent.    TTE, Mar 2024:    * The left ventricle is moderately dilated with LVIDd 6.8 cm.    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    * Severe global hypokinesis with regional variation.    * The right ventricular cavity size is mildly dilated.    * Moderately decreased right ventricular systolic function.    * There is severe mitral regurgitation which is functional secondary to  dilated left ventricle.    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    * Compared to the prior study dated 09/08/22, the EF remains  severely  reduced and there is more significant mitral and tricuspid regurgitation.    IMPRESSION:   [name] is a 77 y.o. male with the following:    Non-ischemic Cardiomyopathy, EF 21%, NYHA  Class 4 at admission, ACC/AHA Stage D, LVIDd 6.9 cm, Medtronic BiV-IC   Multiple hospital admissions in 2023 including Nov 2023 and Feb 2024  [name] spent considerable time in the cardiac ICU and inpatient service at [facility].  With renal function not improving unfortunately he is not a candidate for LVAD.  He was discharged with plans for hospice.  He has been doing well since leaving the hospital and is not currently enrolled in hospice.  Will continue taking care of him with his regular.    He has been doing well since discharge.  Variable diuretic rx compliance, now compliant  Chronic systolic heart failure  Severe mitral and tricuspid regurgitation  Now that he has been doing well leaving the hospital, I will ask our structural heart team to evaluate for possible mitral valve intervention.  AKI on CKD4  Stage IV CLL, dx 06/2020, started acalabrutinib 04/2022  RLE DVT (02/2022)  T2DM, A1C 5.9%  Dyslipidemia  COPD, stable      RECOMMENDATIONS:    Follow up with Dr. Laurine Blazer from [facility] regarding CLL.    It is okay to resume his chemotherapy plan for treating CLL  Continue to limit sodium rich foods, adding salt or eating fast food.  Continue torsemide 80 mg daily  Continue metoprolol succinate 25 mg daily  Continue to monitor your weight each morning on your home scale.  For a weight gain of 2 pound in a day or 3 pounds in 2-3 days, take an extra dose of torsemide in the morning.  Follow up with our clinic in 2 months    Addendum 03/25/2023: He will be completing a colonoscopy in the near future.  If this study is not necessary at this time, I recommend deferring until some other time due t ohis cardiac risk.  I recommend he continue taking his current cardiac medications, including torsemide, through  time of preporation.  I recommend he completed the colonoscopy at [facility] where cardiac anesthesia can assist with the procedure given his moderate-high risk cardiac status.                                                  Orders Placed This Encounter   Procedures    ADVANCED HEART FAILURE OFFICE VISIT (HRT [place])    Structural Heart Consult (HRT [place])       No orders of the defined types were placed in this encounter.        SIGNED:    Marianna Fuss, MD          This note was generated by the Dragon speech recognition and may contain errors or omissions not intended by the user. Grammatical errors, random word insertions, deletions, pronoun errors, and incomplete sentences are occasional consequences of this technology due to software limitations. Not all errors are caught or corrected. If there are questions or concerns about the content of this note or information contained within the body of this dictation, they should be addressed directly with the author for clarification.

## 2023-03-16 ENCOUNTER — Ambulatory Visit
Admission: RE | Admit: 2023-03-16 | Discharge: 2023-03-16 | Disposition: A | Discharge status: Home / Self Care | Payer: Commercial Managed Care - POS | Source: Ambulatory Visit | Attending: Registered Nurse | Admitting: Registered Nurse

## 2023-03-16 ENCOUNTER — Other Ambulatory Visit (INDEPENDENT_AMBULATORY_CARE_PROVIDER_SITE_OTHER): Payer: Self-pay | Admitting: Family Medicine

## 2023-03-16 ENCOUNTER — Encounter (INDEPENDENT_AMBULATORY_CARE_PROVIDER_SITE_OTHER): Payer: Self-pay | Admitting: Family Medicine

## 2023-03-16 ENCOUNTER — Other Ambulatory Visit (INDEPENDENT_AMBULATORY_CARE_PROVIDER_SITE_OTHER): Payer: Self-pay

## 2023-03-16 DIAGNOSIS — I5023 Acute on chronic systolic (congestive) heart failure: Secondary | ICD-10-CM

## 2023-03-16 DIAGNOSIS — C9110 Chronic lymphocytic leukemia of B-cell type not having achieved remission: Secondary | ICD-10-CM | POA: Insufficient documentation

## 2023-03-16 NOTE — Telephone Encounter (Signed)
LOV 03/02/2023, upcoming appointment 06/17/23

## 2023-03-17 ENCOUNTER — Encounter (INDEPENDENT_AMBULATORY_CARE_PROVIDER_SITE_OTHER): Payer: Self-pay | Admitting: Family Medicine

## 2023-03-17 ENCOUNTER — Telehealth (INDEPENDENT_AMBULATORY_CARE_PROVIDER_SITE_OTHER): Payer: Self-pay | Admitting: Family Medicine

## 2023-03-17 MED ORDER — NIACIN ER (ANTIHYPERLIPIDEMIC) 500 MG PO TBCR
500.0000 mg | EXTENDED_RELEASE_TABLET | Freq: Two times a day (BID) | ORAL | 3 refills | Status: DC
Start: 2023-03-17 — End: 2023-07-07

## 2023-03-17 MED ORDER — PANTOPRAZOLE SODIUM 40 MG PO TBEC
40.0000 mg | DELAYED_RELEASE_TABLET | Freq: Two times a day (BID) | ORAL | 0 refills | Status: DC
Start: 2023-03-17 — End: 2023-04-13

## 2023-03-17 MED ORDER — SPIRONOLACTONE 25 MG PO TABS
12.5000 mg | ORAL_TABLET | Freq: Every day | ORAL | 0 refills | Status: DC
Start: 2023-03-17 — End: 2023-04-12

## 2023-03-17 MED ORDER — EMPAGLIFLOZIN 10 MG PO TABS
10.0000 mg | ORAL_TABLET | Freq: Every day | ORAL | 0 refills | Status: DC
Start: 2023-03-17 — End: 2023-04-12

## 2023-03-17 NOTE — Telephone Encounter (Signed)
Called [facility] and left voicemail with callback number. Wanted to know the exact visit date of the paperwork that needed to be resigned and faxed since we get weekly [facility] paperwork for this Pt and most of these evaluations do not ask for a physician signature from the PCP

## 2023-03-17 NOTE — Progress Notes (Signed)
Please advise if appropriate    Medication(s) Requested: spironolactone (ALDACTONE) 25 MG tablet     Medication(s) last refilled: February 13 2023, Qty  15 , Refills 0  Last visit: Mar 02 2023  Patient has an upcoming appointment on June 17 2023.     Medication(s) Requested: empagliflozin (JARDIANCE) 10 MG tablet    Medication(s) last refilled: February 13 2023, Qty 30, Refills 0  Last visit: Mar 02 2023  Patient has an upcoming appointment on June 17 2023.     Medication(s) Requested: pantoprazole (PROTONIX) 40 MG tablet   Medication(s) last refilled: February 12 2023, Qty 60, Refills 0  Last visit: Mar 02 2023  Patient has an upcoming appointment on June 17 2023.

## 2023-03-17 NOTE — Telephone Encounter (Addendum)
[facility] called and clarified it was from 02/17/23. This is a post hospital order from 02/16/23. It was sent on 5/18, 5/21 and 5/26 to [place] but only the first page. Order form was suppose to be 4-5 pages. They will refax all pages for provider to sign

## 2023-03-17 NOTE — Telephone Encounter (Signed)
Received call from [facility]  stating that they have sent multiple requests for post hospital paperwork to be signed and faxed back to office. Representative is requesting paperwork be faxed back as soon as possible. Please advise.

## 2023-03-19 ENCOUNTER — Encounter (INDEPENDENT_AMBULATORY_CARE_PROVIDER_SITE_OTHER): Payer: Self-pay | Admitting: Family Medicine

## 2023-03-22 ENCOUNTER — Telehealth (INDEPENDENT_AMBULATORY_CARE_PROVIDER_SITE_OTHER): Payer: Self-pay

## 2023-03-22 ENCOUNTER — Encounter (INDEPENDENT_AMBULATORY_CARE_PROVIDER_SITE_OTHER): Payer: Self-pay | Admitting: Family Medicine

## 2023-03-22 NOTE — Telephone Encounter (Signed)
Rcv'd call form Dorene Grebe with [facility] requesting pre-or clearance for colonoscopy scheduled for 05/31/23 at 1030 in [facility] with Dr Flossie Buffy. Natalie requesting addendum to LOV to be faxed to [phone]. I will reach out to Dr Webb Silversmith LOV.

## 2023-03-23 ENCOUNTER — Encounter (INDEPENDENT_AMBULATORY_CARE_PROVIDER_SITE_OTHER): Payer: Self-pay | Admitting: Family Medicine

## 2023-03-24 ENCOUNTER — Other Ambulatory Visit (FREE_STANDING_LABORATORY_FACILITY): Payer: Commercial Managed Care - POS

## 2023-03-24 DIAGNOSIS — R809 Proteinuria, unspecified: Secondary | ICD-10-CM

## 2023-03-24 DIAGNOSIS — I509 Heart failure, unspecified: Secondary | ICD-10-CM

## 2023-03-24 DIAGNOSIS — N1832 Chronic kidney disease, stage 3b: Secondary | ICD-10-CM

## 2023-03-24 DIAGNOSIS — Z856 Personal history of leukemia: Secondary | ICD-10-CM

## 2023-03-24 DIAGNOSIS — R609 Edema, unspecified: Secondary | ICD-10-CM

## 2023-03-24 DIAGNOSIS — I2510 Atherosclerotic heart disease of native coronary artery without angina pectoris: Secondary | ICD-10-CM

## 2023-03-24 DIAGNOSIS — E119 Type 2 diabetes mellitus without complications: Secondary | ICD-10-CM

## 2023-03-24 LAB — CELL MORPHOLOGY
Cell Morphology: ABNORMAL — AB
Platelet Estimate: NORMAL

## 2023-03-24 LAB — URINE PROTEIN/CREATININE RATIO
Urine Creatinine, Random: 69.4 mg/dL
Urine Protein Random: 38 mg/dL — ABNORMAL HIGH (ref 1.0–14.0)
Urine Protein/Creatinine Ratio: 0.5

## 2023-03-24 LAB — BASIC METABOLIC PANEL
Anion Gap: 11 (ref 5.0–15.0)
BUN: 46 mg/dL — ABNORMAL HIGH (ref 9.0–28.0)
CO2: 23 mEq/L (ref 17–29)
Calcium: 8.9 mg/dL (ref 7.9–10.2)
Chloride: 104 mEq/L (ref 99–111)
Creatinine: 2.7 mg/dL — ABNORMAL HIGH (ref 0.5–1.5)
Glucose: 276 mg/dL — ABNORMAL HIGH (ref 70–100)
Potassium: 5 mEq/L (ref 3.5–5.3)
Sodium: 138 mEq/L (ref 135–145)
eGFR: 23.6 mL/min/{1.73_m2} — AB (ref >=60)

## 2023-03-24 LAB — CBC AND DIFFERENTIAL
Absolute NRBC: 0.02 10*3/uL — ABNORMAL HIGH (ref 0.00–0.00)
Hematocrit: 35.8 % — ABNORMAL LOW (ref 37.6–49.6)
Hgb: 10.6 g/dL — ABNORMAL LOW (ref 12.5–17.1)
Instrument Absolute Neutrophil Count: 5.18 10*3/uL (ref 1.10–6.33)
MCH: 28.4 pg (ref 25.1–33.5)
MCHC: 29.6 g/dL — ABNORMAL LOW (ref 31.5–35.8)
MCV: 96 fL (ref 78.0–96.0)
MPV: 12.7 fL — ABNORMAL HIGH (ref 8.9–12.5)
Nucleated RBC: 0.2 /100 WBC — ABNORMAL HIGH (ref 0.0–0.0)
Platelets: 205 10*3/uL (ref 142–346)
RBC: 3.73 10*6/uL — ABNORMAL LOW (ref 4.20–5.90)
RDW: 19 % — ABNORMAL HIGH (ref 11–15)
WBC: 9.79 10*3/uL — ABNORMAL HIGH (ref 3.10–9.50)

## 2023-03-24 LAB — MAN DIFF ONLY
Atypical Lymphocytes %: 2 %
Atypical Lymphocytes Absolute: 0.2 10*3/uL — ABNORMAL HIGH (ref 0.00–0.00)
Band Neutrophils Absolute: 0 10*3/uL (ref 0.00–1.00)
Band Neutrophils: 0 %
Basophils Absolute Manual: 0 10*3/uL (ref 0.00–0.08)
Basophils Manual: 0 %
Eosinophils Absolute Manual: 0 10*3/uL (ref 0.00–0.44)
Eosinophils Manual: 0 %
Lymphocytes Absolute Manual: 2.35 10*3/uL (ref 0.42–3.22)
Lymphocytes Manual: 24 %
Monocytes Absolute: 0.2 10*3/uL — ABNORMAL LOW (ref 0.21–0.85)
Monocytes Manual: 2 %
Neutrophils Absolute Manual: 7.05 10*3/uL — ABNORMAL HIGH (ref 1.10–6.33)
Segmented Neutrophils: 72 %

## 2023-03-24 LAB — URINALYSIS WITH REFLEX TO MICROSCOPIC EXAM IF INDICATED
Bilirubin, UA: NEGATIVE
Blood, UA: NEGATIVE
Glucose, UA: >=1000 — AB
Ketones UA: NEGATIVE
Leukocyte Esterase, UA: NEGATIVE
Nitrite, UA: NEGATIVE
Specific Gravity UA: 1.021 (ref 1.001–1.035)
Urine pH: 6 (ref 5.0–8.0)
Urobilinogen, UA: NORMAL mg/dL

## 2023-03-24 LAB — HEMOLYSIS INDEX(SOFT): Hemolysis Index: 3 Index (ref 0–24)

## 2023-03-24 LAB — PTH, INTACT: PTH Intact: 410 pg/mL — ABNORMAL HIGH (ref 18.0–89.0)

## 2023-03-24 LAB — NT-PROBNP: NT-proBNP: 10479 pg/mL — ABNORMAL HIGH (ref 0–450)

## 2023-03-25 NOTE — Progress Notes (Signed)
[facility] OFFICE VISIT    [facility] OFFICE -CARDIOLOGY  [address] [place] [address]  [place] [address]  Dept: [phone]  Dept Fax: [phone]       Date Time: March 29, 2023  11:43 AM  Patient Name: [name] [name]  Requesting Physician: Zorita Pang, MD     Primary Cardiologist: Luella Cook MD, [facility]   Reason for Consultation:   We had the opportunity of evaluating this patient in the [facility]; referred for further evaluation of mitral regurgitation and consideration of percutaneous valve therapy.  History of Present Illness:     [name] [name] is a 77 y.o. 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 He was admitted to [facility] in February and March 2024 due to worsening heart failure.  He spent time in the ICU on inotropes and unfortunately his renal function did not improve.   He has completed LVAD evaluation and unfortunately he has not a candidate due to worsening renal function.  He is referred for evaluation of his severe functional mitral regurgitation.      [name] [name] presents independently today.  Symptomatically, he has shortness of breath with exertion and lower extremity swelling.  He has been on a walking program and is actually significantly improved his stamina.  Denies any chest pain, presyncope or syncope.  Notes he still has lower extremity edema but is hopeful this will improve with medications and ambulation.    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  He lives in [place] in [place] with his wife, who is also a Runner, 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video.    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    Coronary artery disease 2011    CORONARY STENT PLACEMENT FOLLOWED BY [facility]    Depression     echocardiogram 02/2016, 11/2016, 09/2017, 12/20/2017    GERD (gastroesophageal reflux disease)     Heart attack 2011    and 2014    Hyperlipemia     Hypertension     ICD (implantable 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     PLACED 2014  [facility]  LAST INTERROGATION April 01 2018 REPORT REQUESTED    Ischemic cardiomyopathy     EF 15% ON ECHO 09-20-2017 DR GARG IN EPIC    Nonischemic cardiomyopathy     NSTEMI (non-ST elevated myocardial infarction)     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    Pacemaker 2014    MEDTRONIC ICD/PACEMAKER COMBO LAST INTERROGATION  12-12 2018    Pneumonia 09/2016    Primary cardiomyopathy     Ischemic cardiomyopathy EF 15% ON ECHO 09-20-2017 DR GARG IN EPIC    Sepsis 08/2017    Syncope and collapse  PRIOR TO ICD/PACEMAKER PLACED 2014    Type 2 diabetes mellitus, controlled DX 1998    BS  AVG 100  A1C 7.4 Dec 27 2017    [place]eze     PT SEE HIS PMD April 01 2018 RE THIS-TOLD TO SEE PMD BY [facility] JUNE 12 WHEN THEY SAW HIM FOR A CL     Past Surgical History:     Past Surgical History:   Procedure Laterality Date    BIV  11/10/2016    ICD METRONIC  UPGRADE     CARDIAC CATHETERIZATION  03/2010    CORONARY STENT PLACEMENT X 2 PER PT; LM normal, LAD 70-80% distal lesion at apex, 20% lesion mid CFX    CARDIAC CATHETERIZATION  12/2012    CARDIAC DEFIBRILLATOR PLACEMENT  12/2012    [facility]    CARDIAC PACEMAKER PLACEMENT  2014    MEDTRONIC ICD/PACEMAKER PLACED [facility]    CIRCUMCISION  AGE 37    COLONOSCOPY, DIAGNOSTIC (SCREENING)  2009    CORRECTION HAMMER TOE      duodenal ulcer  1973    STRESS RELATED IN SCHOOL     ECHOCARDIOGRAM, TRANSTHORACIC  02/2016 11/2016 09/2017 12/2017    ECHOCARDIOGRAM, TRANSTHORACIC  02/2016,11/2016,12/20/2017    EGD N/A 08/15/2014    Procedure: EGD;  Surgeon: Colon Branch, MD;  Location[facility];  Service: Gastroenterology;  Laterality: N/A;  egd w/ bx    EGD  1975    EGD  02/11/2023    Procedure: EGD and clip placement;  Surgeon: Doyne Keel, MD;  Location: [facility];  Service: Gastroenterology;;    EGD, COLONOSCOPY N/A 04/04/2018    Procedure: EGD with bxs, COLONOSCOPY with polypectomy and clipping;  Surgeon: Doyne Keel, MD;  Location: [facility];  Service: Gastroenterology;  Laterality: N/A;    FRACTURE SURGERY  AGE 15     RT  ANKLE- CAST APPLIED    HERNIA REPAIR  AGE 37    LEFT INGUINAL HERNIA    MPI nuclear study  06/2016    orthopedic surgery  AGE 74    RT foot corrective - HAMMERTOE    RIGHT HEART CATH Right 09/09/2022    Procedure: Right Heart Cath;  Surgeon: Burna Forts, MD;  [facility];  Service: Cardiovascular;  Laterality: Right;  tx from FO    RIGHT HEART CATH ONLY INCL. CO AND SATS Right 12/22/2022    Procedure: Right Heart Cath ONLY incl. CO and Sats;  Surgeon: Day, Bertram Millard, MD;  Location: [facility];  Service: Cardiovascular;  Laterality: Right;  Right Heart Cath with leave in PA cath for milrinone initiation    TONSILLECTOMY AND ADENOIDECTOMY  1954    TUNNELED CATH PLACEMENT (PERMCATH) N/A 12/25/2022    Procedure: [facility];  Surgeon: Tamala Bari, MD;  Location: [facility];  Service: Interventional Radiology;  Laterality: N/A;    TUNNELED CATH REMOVAL (PERMCATH) Right 01/04/2023    Procedure: TUNNELED CATH REMOVAL;  Surgeon: Rex Kras, MD;  Location: [facility];  Service: Interventional Radiology;  Laterality: Right;     Family History:   family history includes Breast cancer in his mother; Coronary artery disease in his mother; Diabetes in his brother and mother; Heart attack in his father;  Heart attack (age of onset: 68) in his mother; Hypertension in his father and mother.    Social History:   He reports that he has never smoked. He has  never used smokeless tobacco. He reports that he does not currently use alcohol. He reports that he does not use drugs.    Medications:   (only cardiac meds were reviewed/managed):   Current Outpatient Medications:     atorvastatin (LIPITOR) 80 MG tablet, Take 1 tablet (80 mg) by mouth daily, Disp: 90 tablet, Rfl: 3    Continuous Blood Gluc Sensor (FreeStyle Libre 3 Sensor) Misc, Use 1 Device every 14 (fourteen) days, Disp: 6 each, Rfl: 3    empagliflozin (JARDIANCE) 10 MG tablet, Take 1 tablet (10 mg) by mouth daily, Disp: 30 tablet, Rfl: 0    FeroSul 325 (65 Fe) MG tablet, Take 1 tablet (325 mg) by mouth daily, Disp: , Rfl:     hydrALAZINE (APRESOLINE) 50 MG tablet, Take 1 tablet (50 mg) by mouth every 8 (eight) hours, Disp: , Rfl:     insulin glargine 100 UNIT/ML injection, Inject 33 Units into the skin every 12 (twelve) hours (Patient taking differently: Inject 33 Units into the skin nightly), Disp: 20 mL, Rfl: 3    insulin lispro, 1 Unit Dial, 100 UNIT/ML pen-injector, Inject 10 Units into the skin 3 (three) times daily before meals, Disp: 9 mL, Rfl: 0    Insulin Pen Needle (B-D UF III MINI PEN NEEDLES) 31G X 5 MM Misc, Use as directed daily, Disp: 200 each, Rfl: 3    isosorbide dinitrate (ISORDIL) 40 MG tablet, Take 1 tablet (40 mg) by mouth 2 (two) times daily, Disp: 180 tablet, Rfl: 3    metoprolol succinate XL (TOPROL-XL) 25 MG 24 hr tablet, Take 1 tablet (25 mg) by mouth 2 (two) times daily, Disp: , Rfl:     niacin (NIASPAN) 500 MG CR tablet, Take 1 tablet (500 mg) by mouth 2 (two) times daily, Disp: 180 tablet, Rfl: 3    pantoprazole (PROTONIX) 40 MG tablet, Take 1 tablet (40 mg) by mouth 2 (two) times daily, Disp: 60 tablet, Rfl: 0    spironolactone (ALDACTONE) 25 MG tablet, Take 0.5 tablets (12.5 mg) by mouth daily, Disp: 15 tablet, Rfl: 0    torsemide 40  MG Tab, Take 2 tablets (80 mg) by mouth daily, Disp: 60 tablet, Rfl: 0    amiodarone (PACERONE) 200 MG tablet, Take 1 tablet (200 mg) by mouth daily (Patient not taking: Reported on 03/29/2023), Disp: 90 tablet, Rfl: 3   Allergies:     Allergies   Allergen Reactions    Allopurinol      Physical Exam:     Vitals:    03/29/23 1119   BP: 122/82   Pulse: 68      General Appearance:  Well-appearing in no acute distress. Well groomed, well nourished  Skin: Warm and dry to touch, no apparent skin lesions, or masses noted.  Head: Normocephalic, hair pattern, no masses or tenderness   Eyes: EOMS Intact, PERRL, conjunctivae and lids unremarkable.  ENT: masked   Neck: JVP normal, no carotid bruit, thyroid not enlarged   Chest: Clear to auscultation bilaterally with good air movement and respiratory effort and no wheezes, rales, or rhonchi   Cardiovascular: Regular rhythm, S1 normal, 3/6 holosystolic ejection murmur heard at the left lower sternal border. Apical impulse not displaced. No gallops or rubs detected  Abdomen: Soft, nontender, nondistended, with normoactive bowel sounds. No organomegaly.  No pulsatile masses, or bruits.   Extremities: Warm.  2+ lower extremity edema bilaterally.  No clubbing, or cyanosis. All peripheral pulses are full and equal.   Neuro: Alert  and oriented x3. No gross motor or sensory deficits noted, affect appropriate.    Imaging studies:     I personally reviewed the following studies:    RHC 12/22/22  RA 15/15/11  RV 48/1/14  PA 48/21/28  PCWP 17/18/17  RA sat 58.9 (pulsox 99%  FickCO 4.2 L/min  FickCI 2.0 L/min/m2  ThermCO 2.8 L/min  ThermCI 1.3 L/min/m2  PVR 2.1 wu (Fick) 3.1 wu (Therm)    TTE 12/21/22  Summary    * Limited echo to evaluate biventricular function.    * The left ventricle is moderately dilated with LVIDd 6.8 cm.    * Left ventricular systolic function is severely decreased with an ejection fraction by Biplane Method of Discs of  11 %.    * Severe global hypokinesis with regional  variation.    * The right ventricular cavity size is mildly dilated.    * Moderately decreased right ventricular systolic function.    * There is severe mitral regurgitation which is functional secondary to  dilated left ventricle.    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    * Moderate pulmonary hypertension with estimated right ventricular systolic pressure of 51 mmHg.    * Compared to the prior study dated 09/08/22, the EF remains severely reduced and there is more significant mitral and tricuspid regurgitation.     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 We discussed that if left untreated this would likely be progressive.  We spoke on the rationale of transcatheter edge-to-edge repair with Mitraclip. We reviewed associated risks, including, but not limited to: bleeding, vessel damage, pericardial effusion/tamponade, stroke and death. Patient is interested in pursuing evaluation.    We also discussed the findings of his most recent echocardiogram, and how I am concerned that he may not be able to tolerate a transcatheter repair for his mitral valve as his LV may be too dilated and the loss of "pop off" may result in further failure.  With that being said, we discussed that further imaging would be helpful to be able to determine this.  Towards that end, we will plan to obtain a TEE and follow-up with the patient afterwards.     Next Steps:  Transesophageal echocardiogram, ideally with our structural heart imaging specialist Dr. Raynelle Bring  Continue atorvastatin, Jardiance, hydralazine, isosorbide dinitrate,  metoprolol succinate, spironolactone, torsemide  Structural heart follow-up in 1 month to discuss the TEE results and determine whether we can move forward with transcatheter repair of the mitral valve.    Thank you and please contact me with any questions    Signed by: Tawanna Sat, MD  Structural Heart    This note was generated by the Dragon speech recognition and may contain errors or omissions not intended by the user. Grammatical errors, random word insertions, deletions, pronoun errors, and incomplete sentences are occasional consequences of this technology due to software limitations. Not all errors are caught or corrected. If there are questions or concerns about the content of this note or information contained within the body of this dictation, they should be  addressed directly with the author for clarification.

## 2023-03-25 NOTE — Progress Notes (Signed)
Subjective:    Patient ID:   HPI  [name] [name] is a 77 y.o. male who arrives today with    Chief Complaint   Patient presents with   . Laceration     Laceration to right foot on Monday. Is a diabetic     Patient reports hitting his right middle toe off a chair leg on Monday. He reports some increased swelling and pain to that area. Unsure of when his last tetanus was. Denies any fever, body aches, chills, or numbness/tingling.       The following portions of the patient's history were reviewed and updated as appropriate: allergies, current medications, past medical history, past surgical history and problem list.    Allergies   Allergen Reactions   . Allopurinol        Past Medical History:   Diagnosis Date   . Diabetes mellitus    . Hyperlipidemia    . Hypertension    . Myocardial infarction        History reviewed. No pertinent surgical history.    History reviewed. No pertinent family history.    Review of Systems   Constitutional:  Negative for fever.   HENT:  Negative for congestion and sore throat.    Eyes:  Negative for redness.   Respiratory:  Negative for cough and wheezing.    Cardiovascular:  Negative for chest pain.   Gastrointestinal:  Negative for abdominal pain.   Musculoskeletal:  Negative for arthralgias.   Skin:  Positive for wound. Negative for rash.   Neurological:  Negative for dizziness and headaches.   Hematological:  Does not bruise/bleed easily.   Psychiatric/Behavioral:  Negative for behavioral problems.        Objective:     Vitals:    03/25/23 0835   BP: 113/62   Pulse: 73   Resp: 16   Temp: 97.4 F (36.3 C)       Physical Exam  Vitals reviewed.   Constitutional:       General: He is not in acute distress.     Appearance: Normal appearance. He is well-developed.   HENT:      Right Ear: External ear normal.      Left Ear: External ear normal.   Eyes:      Conjunctiva/sclera: Conjunctivae normal.   Cardiovascular:      Rate and Rhythm: Normal rate.   Pulmonary:      Effort:  Pulmonary effort is normal.   Skin:     General: Skin is warm.      Capillary Refill: Capillary refill takes less than 2 seconds.      Findings: Wound present.      Comments: 0.5cm skin tear noted to the dorsal right middle toe   Neurological:      Mental Status: He is alert and oriented to person, place, and time.   Psychiatric:         Mood and Affect: Mood normal.         Behavior: Behavior normal.       Assessment and Plan:   [name] was seen today for laceration.    Diagnoses and all orders for this visit:    Laceration of third toe of right foot, initial encounter  -     TD Vaccine 7 YR and Older, 5 LF Tetanus Toxoid, Preservative Free, Adsorbed    Other orders  -     cephalexin (KEFLEX) 500 MG capsule; Take 1 capsule (500 mg)  by mouth 3 (three) times daily for 5 days      Due to amount of swelling and increased pain will go ahead and treat for a suspected developing cellulitis. No fever noted. Patient is not worried that it could be broken. He is able to ambulate with no difficulty. Encouraged to return in 48 hours for a wound recheck. Plan was discussed with patient/parent and they verbalized understanding. If symptoms are not improving within the next couple of days, advised patient/parent to followup with primary care or return to the Urgent Care for further evaluation. Go to Emergency Department immediately for further work up if worsening symptoms or other medical concerns.    Oletta Lamas, NP  [facility]  03/25/2023 10:04 AM

## 2023-03-26 NOTE — Telephone Encounter (Signed)
Addendum to last visit note per Dr Webb Silversmith completed 6/6, faxed to [facility] at [phone].

## 2023-03-29 ENCOUNTER — Ambulatory Visit (INDEPENDENT_AMBULATORY_CARE_PROVIDER_SITE_OTHER): Payer: Commercial Managed Care - POS | Admitting: Interventional Cardiology

## 2023-03-29 ENCOUNTER — Encounter (INDEPENDENT_AMBULATORY_CARE_PROVIDER_SITE_OTHER): Payer: Self-pay | Admitting: Interventional Cardiology

## 2023-03-29 ENCOUNTER — Encounter (INDEPENDENT_AMBULATORY_CARE_PROVIDER_SITE_OTHER): Payer: Self-pay

## 2023-03-29 VITALS — BP 122/82 | HR 68 | Ht 72.0 in | Wt 195.0 lb

## 2023-03-29 DIAGNOSIS — I340 Nonrheumatic mitral (valve) insufficiency: Secondary | ICD-10-CM

## 2023-03-29 DIAGNOSIS — I10 Essential (primary) hypertension: Secondary | ICD-10-CM

## 2023-03-29 DIAGNOSIS — E782 Mixed hyperlipidemia: Secondary | ICD-10-CM

## 2023-03-29 DIAGNOSIS — I25810 Atherosclerosis of coronary artery bypass graft(s) without angina pectoris: Secondary | ICD-10-CM

## 2023-03-29 DIAGNOSIS — I428 Other cardiomyopathies: Secondary | ICD-10-CM

## 2023-03-29 NOTE — Progress Notes (Signed)
[facility]   GENERAL CARDIOLOGY PROCEDURE SCHEDULING SHEET    PATIENT NAME: [name] [name]  MEDICAL RECORD NUMBER: [id number]  DOB: Sep 05, 1946 (77 y.o.)      Procedure Ordered: Transesophageal Echocardiogram   Ordering Diagnosis: Non-ischemic cardiomyopathy [I42.8 (ICD-10-CM)]; Nonrheumatic mitral valve regurgitation     Preferred Procedure Date: 1-2 weeks  Preferred Doctor: Dr. Raynelle Bring  Preferred Hospital:  [] [facility] - [x] [facility] - []  [place]  [] [facility] - [] [facility] - [] [facility]  Does the patient have an ICD or PPM [x] Yes / [] No     ALLERGIES:   Allergies   Allergen Reactions    Allopurinol        IODINE ALLERGY: [] YES / [x] NO                 DIABETIC: [x] YES / [] NO     Type 1 diabetics and those on long acting insulin or have an insulin pump should check with their prescribing provider for hold instructions.     Is the patient on a SGLT2 Inhibitor   [x] YES / [] NO     If Yes, Medication Name: Jardiance                         Date of Last Dose:  SGLT2 inhibitor should be held for 72 hours prior to the procedure.     Is the patient on a GLP-1 Agonist   [] YES / [x] NO       CREATININE LEVEL > 1.5: [x] YES / [] NO       Last Cr on file:   Lab Results   Component Value Date    CREAT 2.7 (H) 03/24/2023          IS PATIENT ON ANTICOAGULATION: [] YES / [x] NO   (instructions to continue or hold their anticoagulant must be addressed)    IS PATIENT TAKING DIURETICS: [x] YES / [] NO     If Yes, patient should hold their spironolactone and torsemide the morning of the procedure.     ADDITIONAL MEDICATIONS NEEDED:      PRE OP BLOODWORK ORDERED: [x] YES / [] NO    Is this a TEE ordered to be done at [facility]: [x] YES / [] NO    If Yes, does the patient have severe RV Dysfunction mentioned on their most recent echo: [] Yes / [x] No)  Does this patient have severe Pulmonary Hypertension (RVSP >40mmHg) mentioned on their most recent echo: [] YES / [x] NO  Current BMI:  26.45  Most Recent EF: 11%       STAT Procedure - []  Yes / [x]  No     If STAT:  Hold patient in office until the Hospital Schedulers have been contacted.   If the patient is on a SGLT2 Inhibitor or GLP-1 Agonist instruct patient to hold any future dose until contacted by the hospital schedulers.   Confirm best phone number to contact patient: #  If number belongs to someone who is not the patient, confirm the individual is on HIPAA  Send STAT notification to the [facility] Schedulers with completed scheduling sheet.   Enter STAT labs, per policy, for patients preferred lab and ensure patient has lab slip before leaving.                   AREA BELOW DOTTED LINE TO BE COMPLETED BY HOSPITAL SCHEDULERS ONLY  -------------------------------------------------------------------------------------------    INSURANCE PHONE#:________________________ ID#:____________________ GROUP#:_________________    DATE PRE-CERTED:_________________________   CONTACT NAME:____________________________________  PACER REP CALLED:________________________ AUTH#:___________________________________________    PT CALLED:_________ LAB:_________ ADMITTING:_________ ADMIT DATE:_________ ORDER/APPT______    INSTRUCTION SHEET MAILED:________________   Ardeen Fillers PERFORMING:_______________________________    REQUEST INFO FROM DR. OFFICE: ________________________________________________________________    PT CHECK IN TIME:________________________   DATE & TIME OF PROCEDURE:________________________    TX FR: ___________________________________          INPT   or   OUTPT   or   ADMIT

## 2023-03-29 NOTE — Progress Notes (Signed)
TEE Pre-Screening Questionnaire    [name] [name]    07/11/1946 (77 y.o.)     Please answer the below questions to the best of your ability and return this form to a clinical staff member once complete.    Do you have difficulty swallowing?   YES NO   Does food get stuck in your throat?   YES NO   Do you have a history of acid reflux/heartburn?   YES NO   Have you ever had an upper endoscopy?   YES NO   Do you have a history of esophageal cancer?   YES NO   Do you have a history of esophageal dilation?   YES NO   Have you ever had radiation to the chest?   YES NO       If you answered YES to any of the above questions, are you currently seeing a gastroenterologist?  YES / NO     If YES, please provide their name Dr. Flossie Buffy    For Office Use Only:    If the patient answered YES to any of the questions above is the ordering provider aware and ok with proceeding?     YES / NO    If Yes, provider who authorized proceeding with the TEE: __________________________________________    If No, next steps prior to scheduling the TEE: ___________________________________________

## 2023-03-30 ENCOUNTER — Encounter (INDEPENDENT_AMBULATORY_CARE_PROVIDER_SITE_OTHER): Payer: Self-pay

## 2023-04-02 ENCOUNTER — Telehealth (INDEPENDENT_AMBULATORY_CARE_PROVIDER_SITE_OTHER): Payer: Commercial Managed Care - POS

## 2023-04-09 ENCOUNTER — Ambulatory Visit (INDEPENDENT_AMBULATORY_CARE_PROVIDER_SITE_OTHER): Payer: Commercial Managed Care - POS

## 2023-04-09 ENCOUNTER — Encounter (INDEPENDENT_AMBULATORY_CARE_PROVIDER_SITE_OTHER): Payer: Self-pay

## 2023-04-09 NOTE — PSS Phone Screening (Addendum)
Pre-Anesthesia Evaluation    Pre-op phone visit requested by:   Reason for pre-op phone visit: Patient anticipating COLONOSCOPY, WITH BIOPSY procedure.         No orders of the defined types were placed in this encounter.  Labs  04/12/23 at [facility] on [address] in [place]    EF 11% on TTE 12/21/22 Navigator notified via email.                 TEE Questions:(note when, where, result, GI MD, etc)    Upper endoscopy - yes 02/11/23 for GI bleed had endo clips placed for GI bleed duodenum leision no current issues 04/09/23  Esophageal cancer - no  Esophageal dilatation -no  Difficulty swallowing - no  Food stuck in throat -no  Radiation to chest - no    History of Present Illness/Summary:        Problem List:  Medical Problems       Hospital Problem List  Date Reviewed: 03/29/2023   None        Non-Hospital Problem List  Date Reviewed: 03/29/2023            ICD-10-CM Priority Class Noted    Type 2 diabetes mellitus with complication, with long-term current use of insulin (Chronic) E11.8, Z79.4   Unknown    Heart attack I21.9   Unknown    Overview Signed 12/30/2013 11:02 AM by Silvano Rusk, DNP NP     and 2014         CAD (coronary artery disease) (Chronic) I25.10   Unknown    Overview Signed 01/23/2019  2:28 PM by Unk Pinto, RN     [phone] cardiology specialist. 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, S09.90XS   08/07/2015     Essential hypertension (Chronic) I10   08/07/2015    Syncope and collapse R55   02/20/2016    AICD (automatic cardioverter/defibrillator) present (Chronic) Z95.810   02/20/2016    Cardiomyopathy I42.9   11/10/2016    SOB (shortness of breath) R06.02   09/16/2017    GERD (gastroesophageal reflux disease) (Chronic) K21.9   04/04/2018    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   04/11/2010    Heart failure, unspecified I50.9   08/31/2017    Nonischemic cardiomyopathy I42.8   04/06/2016    Pure hypercholesterolemia E78.00   04/11/2010    Chronic systolic congestive heart failure I50.22   04/11/2020    Encounter for implantable defibrillator reprogramming or check Z45.02   04/11/2020    Episode of recurrent major depressive disorder, 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   01/13/2021    Stage 3 chronic kidney disease, 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   01/13/2021    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, F32.A   02/17/2021    COPD (  chronic obstructive pulmonary disease) (Chronic) J44.9   02/17/2021    HLD (hyperlipidemia) (Chronic) E78.5   02/17/2021    Sleep apnea, 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   10/07/2021    Scrotal edema N50.89   03/09/2022    Hydrocele, unspecified hydrocele type N43.3   03/09/2022    Acute on chronic congestive heart failure, unspecified heart failure type I50.9   03/09/2022    Acute on chronic right-sided heart failure I50.813   09/04/2022    Ischemic cardiomyopathy I25.5   09/09/2022    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   10/26/2022    SVT (supraventricular tachycardia) I47.10   12/20/2022    Heart failure I50.9   12/22/2022    Iron deficiency anemia secondary to inadequate dietary iron intake D50.8   12/23/2022    Heart failure, unspecified HF chronicity, unspecified heart failure type I50.9   12/21/2022    Encounter for removal of tunneled central venous catheter (CVC) with port Z45.2   12/21/2022    GI bleed K92.2   02/10/2023    Iron deficiency anemia, unspecified iron deficiency anemia  type D50.9   02/12/2023        Medical History   Diagnosis Date    Acute CHF     NOV  & DEC 2018, - DIALYSIS CATH PLACED Aug 24 2017 TO REMOVE FLUID     Acute systolic (congestive) heart failure 08/2017    CAD (coronary artery disease)     Cardiomyopathy     nonischemic    CHF (congestive heart failure) 08/12/2014, 2013    Chronic obstructive pulmonary disease     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    Coronary artery disease 2011    CORONARY STENT PLACEMENT FOLLOWED BY [facility]    echocardiogram 02/2016, 11/2016, 09/2017, 12/20/2017    GERD (gastroesophageal reflux disease)     Heart attack 2011    and 2014    Hyperlipemia     Hypertension     116/80    ICD (implantable 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     PLACED 2014  [facility]  LAST INTERROGATION April 01 2018 REPORT REQUESTED    Ischemic cardiomyopathy     EF 15% ON ECHO 09-20-2017 DR GARG IN [facility]    Nonischemic cardiomyopathy     NSTEMI (non-ST elevated myocardial infarction)     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    Pacemaker 2014    MEDTRONIC ICD/PACEMAKER COMBO LAST INTERROGATION  12-12 2018    Pneumonia 09/2016    Primary cardiomyopathy     Ischemic cardiomyopathy EF 15% ON ECHO 09-20-2017 DR GARG IN [facility]    Renal insufficiency     Sepsis 08/2017    Syncope and collapse     PRIOR TO ICD/PACEMAKER PLACED 2014    Type 2 diabetes mellitus, controlled DX 1998    A1c ~6.0 (02/2023) FBS~110    Wheeze     PT SEE HIS PMD April 01 2018 RE THIS-TOLD TO SEE PMD BY [facility] JUNE 12 WHEN THEY SAW HIM FOR A CL     Past Surgical History:   Procedure Laterality Date    BIV  11/10/2016    ICD METRONIC  UPGRADE     CARDIAC CATHETERIZATION  03/2010    CORONARY STENT PLACEMENT X 2 PER PT; LM normal, LAD 70-80% distal lesion at apex, 20% lesion mid CFX    CARDIAC CATHETERIZATION  12/2012    CARDIAC DEFIBRILLATOR PLACEMENT  12/2012     HEART    CARDIAC PACEMAKER  PLACEMENT  2014    MEDTRONIC ICD/PACEMAKER PLACED [facility]    CIRCUMCISION  AGE 89    COLONOSCOPY, DIAGNOSTIC (SCREENING)   2009    CORRECTION HAMMER TOE      duodenal ulcer  1973    STRESS RELATED IN SCHOOL    ECHOCARDIOGRAM, TRANSTHORACIC  02/2016 11/2016 09/2017 12/2017    ECHOCARDIOGRAM, TRANSTHORACIC  02/2016,11/2016,12/20/2017    EGD N/A 08/15/2014    Procedure: EGD;  Surgeon: Colon Branch, MD;  Location: [facility];  Service: Gastroenterology;  Laterality: N/A;  egd w/ bx    EGD  1975    EGD  02/11/2023    Procedure: EGD and clip placement;  Surgeon: Doyne Keel, MD;  Location: [facility];  Service: Gastroenterology;;    EGD, COLONOSCOPY N/A 04/04/2018    Procedure: EGD with bxs, COLONOSCOPY with polypectomy and clipping;  Surgeon: Doyne Keel, MD;  Location: [facility];  Service: Gastroenterology;  Laterality: N/A;    FRACTURE SURGERY  AGE 3     RT  ANKLE- CAST APPLIED    HERNIA REPAIR  AGE 89    LEFT INGUINAL HERNIA    MPI nuclear study  06/2016    orthopedic surgery  AGE 66    RT foot corrective - HAMMERTOE    RIGHT HEART CATH Right 09/09/2022    Procedure: Right Heart Cath;  Surgeon: Burna Forts, MD;  Location: [facility];  Service: Cardiovascular;  Laterality: Right;  tx from FO    RIGHT HEART CATH ONLY INCL. CO AND SATS Right 12/22/2022    Procedure: Right Heart Cath ONLY incl. CO and Sats;  Surgeon: Day, Bertram Millard, MD;  Location: [facility];  Service: Cardiovascular;  Laterality: Right;  Right Heart Cath with leave in PA cath for milrinone initiation    TONSILLECTOMY AND ADENOIDECTOMY  1954    TUNNELED CATH PLACEMENT ([facility]ATH) N/A 12/25/2022    Procedure: [facility];  Surgeon: Tamala Bari, MD;  Location: [facility];  Service: Interventional Radiology;  Laterality: N/A;    TUNNELED CATH REMOVAL ([facility]ATH) Right 01/04/2023    Procedure: TUNNELED CATH REMOVAL;  Surgeon: Rex Kras, MD;  Location: [facility];  Service: Interventional Radiology;  Laterality: Right;        Medication List            Accurate as of April 09, 2023  2:15 PM. Always use your  most recent med list.                aspirin EC 81 MG EC tablet  Take 1 tablet (81 mg) by mouth daily  Generic drug: aspirin EC  Medication Adjustments for Surgery: Take morning of surgery  Notes to patient: Per MD instructions     atorvastatin 80 MG tablet  Take 1 tablet (80 mg) by mouth daily  Commonly known as: LIPITOR  Medication Adjustments for Surgery: Take morning of surgery     B-D UF III MINI PEN NEEDLES 31G X 5 MM Misc  Use as directed daily  Generic drug: Insulin Pen Needle  Medication Adjustments for Surgery: Take as prescribed     empagliflozin 10 MG tablet  Take 1 tablet (10 mg) by mouth daily  Commonly known as: JARDIANCE  Medication Adjustments for Surgery: Stop 3 days before surgery  Notes to patient: Per MD instructions Last dose 04/17/23     FeroSul 325 (65 Fe) MG tablet  Take 1 tablet (325 mg) by  mouth daily  Generic drug: ferrous sulfate  Medication Adjustments for Surgery: Hold day of surgery     FreeStyle Libre 3 Sensor Misc  Use 1 Device every 14 (fourteen) days  Medication Adjustments for Surgery: Take as prescribed     hydrALAZINE 50 MG tablet  Take 1 tablet (50 mg) by mouth every 8 (eight) hours  Commonly known as: APRESOLINE  Medication Adjustments for Surgery: Take morning of surgery  Notes to patient: Per MD instructions     insulin glargine 100 UNIT/ML injection  Inject 33 Units into the skin every 12 (twelve) hours  Commonly known as: LANTUS  Medication Adjustments for Surgery: Take as prescribed  Notes to patient: Takes at night     insulin lispro (1 Unit Dial) 100 UNIT/ML pen-injector  Inject 10 Units into the skin 3 (three) times daily before meals  Commonly known as: HumaLOG KwikPen  Medication Adjustments for Surgery: Hold day of surgery  Notes to patient: Duplicate     isosorbide dinitrate 40 MG tablet  Take 1 tablet (40 mg) by mouth 2 (two) times daily  Commonly known as: ISORDIL  Medication Adjustments for Surgery: Take morning of surgery  Notes to patient: Per MD  instructions     metoprolol succinate XL 25 MG 24 hr tablet  Take 1 tablet (25 mg) by mouth 2 (two) times daily  Commonly known as: TOPROL-XL  Medication Adjustments for Surgery: Take morning of surgery  Notes to patient: Per MD instructions     niacin 500 MG CR tablet  Take 1 tablet (500 mg) by mouth 2 (two) times daily  Commonly known as: NIASPAN  Medication Adjustments for Surgery: Hold day of surgery     pantoprazole 40 MG tablet  Take 1 tablet (40 mg) by mouth 2 (two) times daily  Commonly known as: PROTONIX  Medication Adjustments for Surgery: Take morning of surgery     spironolactone 25 MG tablet  Take 0.5 tablets (12.5 mg) by mouth daily  Commonly known as: ALDACTONE  Medication Adjustments for Surgery: Take morning of surgery  Notes to patient: Per anesthesia guidelines     Torsemide 40 MG Tabs  Take 2 tablets (80 mg) by mouth daily  Medication Adjustments for Surgery: Hold day of surgery  Notes to patient: Per MD instructions     UNABLE TO FIND  Inject 10 Units into the skin 3 (three) times daily before meals Insulin Lispro  Medication Adjustments for Surgery: Hold day of surgery  Notes to patient: Per anesthesia guidelines            Allergies   Allergen Reactions    Allopurinol Edema     Family History   Problem Relation Age of Onset    Breast cancer Mother     Heart attack Mother 47    Hypertension Mother     Diabetes Mother     Coronary artery disease Mother     Diabetes Brother     Heart attack Father     Hypertension Father      Social History     Occupational History    Occupation: Runner, 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, history    Tobacco Use    Smoking status: Never    Smokeless tobacco: Never   Vaping Use    Vaping status: Never Used   Substance and Sexual Activity    Alcohol use: Not Currently    Drug use: No    Sexual activity: Not Currently     Partners: Female  Exam Scores:   SDB score  OSA Risk Category: No Risk        STBUR score       PONV score  Nausea Risk: LOW RISK    MST score  MST Score: 0     PEN-FAST score       Frailty score  CFS Score: 3    CHADsVasc            Visit Vitals  Ht 1.829 m (6')   Wt 85.3 kg (188 lb)   BMI 25.50 kg/m       Recent Labs   CBC (last 180 days) 02/12/23  0800 03/24/23  0955   WBC 8.85 9.79*   RBC 2.61* 3.73*   Hgb 8.3* 10.6*   Hematocrit 26.0* 35.8*   MCV 99.6* 96.0   MCH 31.8 28.4   MCHC 31.9 29.6*   RDW 25* 19*   Platelets 102* 205   MPV 11.8 12.7*   Nucleated RBC 0.6* 0.2*   Absolute NRBC 0.05* 0.02*     Recent Labs   BMP (last 180 days) 03/10/23  0847 03/24/23  0955   Glucose 402* 276*   BUN 66.0* 46.0*   Creatinine 3.0* 2.7*   Sodium 138 138   Potassium 4.6 5.0   Chloride 105 104   CO2 21 23   Calcium 9.0 8.9   Anion Gap 12.0 11.0   eGFR 20.8* 23.6*     Recent Labs   Coag Panel (last 180 days) 12/24/22  1112 02/10/23  1131 02/11/23  0839   PTT 102* 33  --    PT 16.3* 22.1* 15.3*   PT INR 1.4* 1.9* 1.3*     Recent Labs   Other (last 180 days) 12/21/22  0416 12/21/22  1050 12/21/22  2247 12/22/22  0424 12/22/22  1430 12/22/22  1505 12/23/22  1200 12/23/22  1602 12/24/22  1513 12/25/22  0328 02/11/23  0839 02/12/23  0800 03/02/23  1222 03/24/23  0955   TSH 2.18  --   --   --   --   --   --   --   --   --   --   --   --   --    Bilirubin, Total 3.6*  --  1.8*   < >  --    < >  --    < > 1.2   < > 1.9* 1.2  --   --    ALT 73*  --  425*   < >  --    < >  --    < > 234*   < > 24 20  --   --    AST (SGOT) 73*  --  426*   < >  --    < >  --    < > 74*   < > 22 16  --   --    Protein, Total 6.7  --  6.1   < >  --    < >  --    < > 6.2   < > 5.5* 4.9*  --   --    Hemoglobin A1C  --   --  7.7*  --   --   --   --   --   --   --   --   --   --   --    NT-proBNP  --   --  16,109*  --   --   --   --   --   --    < >  --   --  1,610* 10,479*   Iron  --   --  32*  --   --   --   --   --   --   --  74  --   --   --    Ferritin  --   --   --   --  57.00  --   --   --   --   --  47.17  --   --   --    Iron Saturation  --   --  10*  --   --   --   --   --   --   --  22  --   --   --    Vitamin  B-12  --   --   --   --   --   --   --   --  >2,000*  --   --   --   --   --    Culture MRSA Surveillance  --   --   --   --   --   --  Negative for Methicillin Resistant Staph aureus  Negative for Methicillin Resistant Staph aureus  --   --   --   --   --   --   --    SARS-CoV-2 Specimen Source  --  Nasal Swab  --   --   --   --   --   --   --   --   --   --   --   --    SARS-CoV-2 Overall Result  --  Not Detected  --   --   --   --   --   --   --   --   --   --   --   --     < > = values in this interval not displayed.

## 2023-04-09 NOTE — Pre-Procedure Instructions (Signed)
Important Instructions Before Your Procedure   Procedure/Date/Time: TEE 04/21/23 0930        Arrival Time: 0830    The date and/or time of your surgery may change.  Your surgeon's office will notify you, up until the business day before surgery, if there is any change to your surgery date or time.  Please don't hesitate to call your surgeon's office directly with any questions.    Your procedure will take place at:   [facility]   [address] [place] [address]  Cardiac Diagnostic Lab, First Floor, [phone]    Directions:    Park in the [address] garage (located behind the [facility])      - Psychologist, sport and exercise the [facility]      - Walk to the main lobby, take the elevator to the first floor.      - Turn left off the elevator       - Turn left down the hallway directly across from the [facility]      - Proceed to Cardiac Diagnostics (on the corner of the hallway)     Valet Parking:   Valet parking is at the circular driveway marked "[place]  [facility]"Leave your car with the Valet and continue across the atrium to the elevators.  Mon - Fri 5:30 am - 10 pm. After service hours [phone] - [phone].  Wheelchairs are available upon request.     Day of procedure contact:    Call [phone] if you are running late, run into traffic or any other delay the day of your surgery/procedure    Eating and Drinking Instructions:   Follow your surgeon's instruction regarding your eating and drinking prior procedure.  If you have not been given instructions, Follow  below instructions.  Solids Clear Liquids/Ice Chips   No solid food after 11 pm the night before surgery. You may have "clear" liquids or ice chips up to 4 hours prior to your procedure time  DO NOT DRINK ANYTHING AT OR AFTER 0530  Examples of clear liquids include water, apple juice, sports drinks such as Gatorade, and coffee or tea without  cream or milk. Sugar or sweetener may be added.         Oral Anticoagulants for patients having a Cardioversion:  Please continue to take your anticoagulation as prescribed every day including the day of the your procedure.      Other Instructions:  You must have a responsible adult family or friend to take you home safely; we cannot send you home in an [employer], or taxi-like service without a responsible adult with you.  You may have no more than 2 adults with you the day of surgery  Bring a list of your medications (Do Not bring any medications from home)  Bring photo ID, insurance card, and method of payment for copay  Bring a case for glasses, hearing aids, dentures, marked with your name and date of birth.   Do not use any lotions, perfumes/colognes, cosmetics, creams, powders, or deodorant   Avoid shaving the surgical site                           Wear loose comfortable clothing, easy to change in and out of  Wear no jewelry or metal; if jewelry is not removable, notify pre-op nurse when you arrive  If you become sick or your condition changes, immediately call your surgeon's office                         Avoid alcohol/tobacco for at least 24 hours           QUESTIONS:  If you have any questions about the information from this interview, please call the Pre-Procedure Nurse at: [phone].       IMPORTANT You must visit the Preparing for Your Procedure guide link below for additional instructions including fasting guidelines and directions before your procedure. If you received fasting or skin preparation instructions from your surgeon or pre-procedural provider, please follow those specific instructions. The instructions here are general instructions that do not pertain to all patients.  [email or web address]    If the link doesn't open, please copy and paste in your browser

## 2023-04-11 ENCOUNTER — Other Ambulatory Visit (INDEPENDENT_AMBULATORY_CARE_PROVIDER_SITE_OTHER): Payer: Self-pay | Admitting: Family Medicine

## 2023-04-12 ENCOUNTER — Other Ambulatory Visit (FREE_STANDING_LABORATORY_FACILITY): Payer: Commercial Managed Care - POS

## 2023-04-12 DIAGNOSIS — I428 Other cardiomyopathies: Secondary | ICD-10-CM

## 2023-04-12 DIAGNOSIS — Z95810 Presence of automatic (implantable) cardiac defibrillator: Secondary | ICD-10-CM

## 2023-04-12 DIAGNOSIS — I340 Nonrheumatic mitral (valve) insufficiency: Secondary | ICD-10-CM

## 2023-04-12 DIAGNOSIS — I429 Cardiomyopathy, unspecified: Secondary | ICD-10-CM

## 2023-04-12 LAB — BASIC METABOLIC PANEL
Anion Gap: 13 (ref 5.0–15.0)
BUN: 43 mg/dL — ABNORMAL HIGH (ref 9–28)
CO2: 21 mEq/L (ref 17–29)
Calcium: 9.7 mg/dL (ref 7.9–10.2)
Chloride: 102 mEq/L (ref 99–111)
Creatinine: 2.5 mg/dL — ABNORMAL HIGH (ref 0.5–1.5)
GFR: 26 mL/min/{1.73_m2} — ABNORMAL LOW (ref >=60.0)
Glucose: 272 mg/dL — ABNORMAL HIGH (ref 70–100)
Hemolysis Index: 13 Index
Potassium: 4.8 mEq/L (ref 3.5–5.3)
Sodium: 136 mEq/L (ref 135–145)

## 2023-04-12 LAB — LAB USE ONLY - MANUAL DIFFERENTIAL
Absolute Bands: 0 10*3/uL (ref 0.00–1.00)
Absolute Basophils: 0.09 10*3/uL — ABNORMAL HIGH (ref 0.00–0.08)
Absolute Eosinophils: 0 10*3/uL (ref 0.00–0.44)
Absolute Lymphocytes: 5.22 10*3/uL — ABNORMAL HIGH (ref 0.42–3.22)
Absolute Monocytes: 0.37 10*3/uL (ref 0.21–0.85)
Absolute Neutrophils: 3.48 10*3/uL (ref 1.10–6.33)
Bands %: 0 %
Basophils %: 1 %
Eosinophils %: 0 %
Lymphocytes %: 57 %
Monocytes %: 4 %
Neutrophils %: 38 %
Platelet Estimate: DECREASED — AB
RBC Morphology: NORMAL

## 2023-04-12 LAB — LAB USE ONLY - CBC WITH DIFFERENTIAL
Absolute nRBC: 0 10*3/uL (ref <=0.00)
Hematocrit: 39.2 % (ref 37.6–49.6)
Hemoglobin: 11.9 g/dL — ABNORMAL LOW (ref 12.5–17.1)
MCH: 28.1 pg (ref 25.1–33.5)
MCHC: 30.4 g/dL — ABNORMAL LOW (ref 31.5–35.8)
MCV: 92.5 fL (ref 78.0–96.0)
Platelet Count: 111 10*3/uL — ABNORMAL LOW (ref 142–346)
Preliminary Absolute Neutrophil Count: 3.56 10*3/uL (ref 1.10–6.33)
RBC: 4.24 10*6/uL (ref 4.20–5.90)
RDW: 19 % — ABNORMAL HIGH (ref 11–15)
WBC: 9.16 10*3/uL (ref 3.10–9.50)
nRBC %: 0 /100 WBC (ref <=0.0)

## 2023-04-12 LAB — REMOTE CARDIAC DEVICE MONITORING
AF Burden Percentage: 0
ICD Shock Recent Count: 0
RV Pacing Percentage: 4.25

## 2023-04-13 ENCOUNTER — Other Ambulatory Visit (INDEPENDENT_AMBULATORY_CARE_PROVIDER_SITE_OTHER): Payer: Self-pay | Admitting: Family Medicine

## 2023-04-13 NOTE — Telephone Encounter (Signed)
LV- 03/02/23    Rx refilled.

## 2023-04-19 ENCOUNTER — Other Ambulatory Visit (INDEPENDENT_AMBULATORY_CARE_PROVIDER_SITE_OTHER): Payer: Self-pay

## 2023-04-19 DIAGNOSIS — I5023 Acute on chronic systolic (congestive) heart failure: Secondary | ICD-10-CM

## 2023-04-19 MED ORDER — HYDRALAZINE HCL 50 MG PO TABS
50.0000 mg | ORAL_TABLET | Freq: Three times a day (TID) | ORAL | 3 refills | Status: DC
Start: 2023-04-19 — End: 2023-07-07

## 2023-04-19 MED ORDER — ATORVASTATIN CALCIUM 80 MG PO TABS
80.0000 mg | ORAL_TABLET | Freq: Every day | ORAL | 3 refills | Status: DC
Start: 2023-04-19 — End: 2023-07-23

## 2023-04-21 ENCOUNTER — Ambulatory Visit
Admission: RE | Admit: 2023-04-21 | Discharge: 2023-04-21 | Disposition: A | Discharge status: Home / Self Care | Payer: Commercial Managed Care - POS | Source: Ambulatory Visit | Attending: Cardiovascular Disease | Admitting: Cardiovascular Disease

## 2023-04-21 ENCOUNTER — Ambulatory Visit: Payer: Commercial Managed Care - POS | Admitting: Student in an Organized Health Care Education/Training Program

## 2023-04-21 ENCOUNTER — Encounter
Admission: RE | Disposition: A | Discharge status: Home / Self Care | Payer: Self-pay | Source: Ambulatory Visit | Attending: Cardiovascular Disease

## 2023-04-21 ENCOUNTER — Ambulatory Visit (HOSPITAL_BASED_OUTPATIENT_CLINIC_OR_DEPARTMENT_OTHER): Payer: Commercial Managed Care - POS

## 2023-04-21 ENCOUNTER — Ambulatory Visit: Admission: RE | Admit: 2023-04-21 | Payer: Commercial Managed Care - POS | Source: Ambulatory Visit

## 2023-04-21 ENCOUNTER — Encounter: Payer: Self-pay | Admitting: Cardiovascular Disease

## 2023-04-21 DIAGNOSIS — K279 Peptic ulcer, site unspecified, unspecified as acute or chronic, without hemorrhage or perforation: Secondary | ICD-10-CM | POA: Insufficient documentation

## 2023-04-21 DIAGNOSIS — I252 Old myocardial infarction: Secondary | ICD-10-CM | POA: Insufficient documentation

## 2023-04-21 DIAGNOSIS — X58XXXS Exposure to other specified factors, sequela: Secondary | ICD-10-CM | POA: Insufficient documentation

## 2023-04-21 DIAGNOSIS — S0990XS Unspecified injury of head, sequela: Secondary | ICD-10-CM | POA: Insufficient documentation

## 2023-04-21 DIAGNOSIS — I340 Nonrheumatic mitral (valve) insufficiency: Secondary | ICD-10-CM

## 2023-04-21 DIAGNOSIS — I5023 Acute on chronic systolic (congestive) heart failure: Secondary | ICD-10-CM | POA: Insufficient documentation

## 2023-04-21 DIAGNOSIS — I428 Other cardiomyopathies: Secondary | ICD-10-CM | POA: Insufficient documentation

## 2023-04-21 DIAGNOSIS — I471 Supraventricular tachycardia, unspecified: Secondary | ICD-10-CM | POA: Insufficient documentation

## 2023-04-21 DIAGNOSIS — I130 Hypertensive heart and chronic kidney disease with heart failure and stage 1 through stage 4 chronic kidney disease, or unspecified chronic kidney disease: Secondary | ICD-10-CM | POA: Insufficient documentation

## 2023-04-21 DIAGNOSIS — I2510 Atherosclerotic heart disease of native coronary artery without angina pectoris: Secondary | ICD-10-CM | POA: Insufficient documentation

## 2023-04-21 DIAGNOSIS — K219 Gastro-esophageal reflux disease without esophagitis: Secondary | ICD-10-CM | POA: Insufficient documentation

## 2023-04-21 DIAGNOSIS — I081 Rheumatic disorders of both mitral and tricuspid valves: Secondary | ICD-10-CM | POA: Insufficient documentation

## 2023-04-21 DIAGNOSIS — E1122 Type 2 diabetes mellitus with diabetic chronic kidney disease: Secondary | ICD-10-CM | POA: Insufficient documentation

## 2023-04-21 DIAGNOSIS — G4733 Obstructive sleep apnea (adult) (pediatric): Secondary | ICD-10-CM | POA: Insufficient documentation

## 2023-04-21 DIAGNOSIS — J449 Chronic obstructive pulmonary disease, unspecified: Secondary | ICD-10-CM | POA: Insufficient documentation

## 2023-04-21 DIAGNOSIS — G44309 Post-traumatic headache, unspecified, not intractable: Secondary | ICD-10-CM | POA: Insufficient documentation

## 2023-04-21 DIAGNOSIS — C9110 Chronic lymphocytic leukemia of B-cell type not having achieved remission: Secondary | ICD-10-CM | POA: Insufficient documentation

## 2023-04-21 DIAGNOSIS — N183 Chronic kidney disease, stage 3 unspecified: Secondary | ICD-10-CM | POA: Insufficient documentation

## 2023-04-21 HISTORY — PX: ECHOCARDIOGRAM, TRANSESOPHAGEAL: SHX3783

## 2023-04-21 LAB — ECHO ADULT TEE
MV Mean Gradient: 1.743
MV Mean Gradient: 2
Pulmonary Valve Findings: NORMAL
RV Function: DECREASED
Visually Estimated Ejection Fraction Range: <20

## 2023-04-21 LAB — WHOLE BLOOD GLUCOSE POCT
Whole Blood Glucose POCT: 239 mg/dL — ABNORMAL HIGH (ref 70–100)
Whole Blood Glucose POCT: 226 mg/dL — ABNORMAL HIGH (ref 70–100)
Whole Blood Glucose POCT: 239 mg/dL — ABNORMAL HIGH (ref 70–100)
Whole Blood Glucose POCT: 192 mg/dL — ABNORMAL HIGH (ref 70–100)

## 2023-04-21 SURGERY — ECHOCARDIOGRAM, TRANSESOPHAGEAL
Anesthesia: Anesthesia MAC / Sedation | Site: Esophagus | Wound class: Clean Contaminated

## 2023-04-21 MED ORDER — GLYCOPYRROLATE 0.2 MG/ML IJ SOLN (WRAP)
INTRAMUSCULAR | Status: DC | PRN
Start: 2023-04-21 — End: 2023-04-21
  Administered 2023-04-21: .2 mg via INTRAVENOUS

## 2023-04-21 MED ORDER — MIDAZOLAM HCL 1 MG/ML IJ SOLN (WRAP)
INTRAMUSCULAR | Status: AC
Start: 2023-04-21 — End: ?
  Filled 2023-04-21: qty 2

## 2023-04-21 MED ORDER — LACTATED RINGERS IV SOLN
INTRAVENOUS | Status: DC | PRN
Start: 2023-04-21 — End: 2023-04-21

## 2023-04-21 MED ORDER — ONDANSETRON HCL 4 MG/2ML IJ SOLN
4.0000 mg | Freq: Once | INTRAMUSCULAR | Status: DC | PRN
Start: 2023-04-21 — End: 2023-04-21

## 2023-04-21 MED ORDER — INSULIN NPH (HUMAN) (ISOPHANE) 100 UNIT/ML SC SUSP
5.0000 [IU] | Freq: Once | SUBCUTANEOUS | Status: AC
Start: 2023-04-21 — End: 2023-04-21
  Administered 2023-04-21: 5 [IU] via SUBCUTANEOUS

## 2023-04-21 MED ORDER — LACTATED RINGERS IV SOLN
INTRAVENOUS | Status: DC
Start: 2023-04-21 — End: 2023-04-21

## 2023-04-21 MED ORDER — LIDOCAINE HCL 2 % IJ SOLN
INTRAMUSCULAR | Status: DC | PRN
Start: 2023-04-21 — End: 2023-04-21
  Administered 2023-04-21: 60 mg

## 2023-04-21 MED ORDER — MIDAZOLAM HCL 1 MG/ML IJ SOLN (WRAP)
INTRAMUSCULAR | Status: DC | PRN
Start: 2023-04-21 — End: 2023-04-21
  Administered 2023-04-21 (×2): 1 mg via INTRAVENOUS

## 2023-04-21 MED ORDER — INSULIN LISPRO 100 UNIT/ML SOLN (WRAP)
Status: DC | PRN
Start: 2023-04-21 — End: 2023-04-21
  Administered 2023-04-21: 3 [IU] via SUBCUTANEOUS

## 2023-04-21 MED ORDER — FENTANYL CITRATE (PF) 50 MCG/ML IJ SOLN (WRAP)
INTRAMUSCULAR | Status: AC
Start: 2023-04-21 — End: ?
  Filled 2023-04-21: qty 2

## 2023-04-21 MED ORDER — ACETAMINOPHEN 325 MG PO TABS
650.0000 mg | ORAL_TABLET | Freq: Once | ORAL | Status: DC | PRN
Start: 2023-04-21 — End: 2023-04-21

## 2023-04-21 MED ORDER — BENZOCAINE 20% MT SOLN (WRAP)
OROMUCOSAL | Status: DC | PRN
Start: 2023-04-21 — End: 2023-04-21
  Administered 2023-04-21: 2 via OROMUCOSAL

## 2023-04-21 MED ORDER — LIDOCAINE HCL (PF) 2 % IJ SOLN
INTRAMUSCULAR | Status: AC
Start: 2023-04-21 — End: ?
  Filled 2023-04-21: qty 5

## 2023-04-21 MED ORDER — FENTANYL CITRATE (PF) 50 MCG/ML IJ SOLN (WRAP)
INTRAMUSCULAR | Status: DC | PRN
Start: 2023-04-21 — End: 2023-04-21
  Administered 2023-04-21: 50 ug via INTRAVENOUS

## 2023-04-21 SURGICAL SUPPLY — 2 items
ELECTRODE DEFIBRILLATOR ADULT CONDUCTIVE (Procedure Accessories) ×1
ELECTRODE DEFIBRILLATOR ADULT CONDUCTIVE GEL RTS (Procedure Accessories) IMPLANT

## 2023-04-21 NOTE — Anesthesia Postprocedure Evaluation (Signed)
Anesthesia Post Evaluation    Patient: [name] [name]    Procedure(s) with comments:  ECHOCARDIOGRAM, TRANSESOPHAGEAL - Probe #[id number]    Anesthesia type: MAC    Last Vitals:   Vitals Value Taken Time   BP 104/67 04/21/23 1150   Temp 36.2 C (97.2 F) 04/21/23 1150   Pulse 65 04/21/23 1150   Resp 10 04/21/23 1150   SpO2 96 % 04/21/23 1150                 Anesthesia Post Evaluation:     Patient Evaluated: PACU    Level of Consciousness: awake and alert  Pain Score: 0  Pain Management: adequate  Non-opioid multimodal analgesia not used between 6 hours prior to anesthesia start and PACU discharge    Airway Patency: patent        Anesthetic complications: No      PONV Status: none    Cardiovascular status: acceptable  Respiratory status: acceptable  Hydration status: acceptable    Quality/Regulatory Reporting Exceptions  Medical reason(s) for not using multimodal pain management: no pain reported    Signed by: Collene Schlichter, MD, 04/21/2023 12:47 PM

## 2023-04-21 NOTE — Transfer of Care (Signed)
Anesthesia Transfer of Care Note    Patient: [name] [name]    Procedures performed: Procedure(s) with comments:  ECHOCARDIOGRAM, TRANSESOPHAGEAL - Probe #[id number]    Anesthesia type: MAC    Patient location:Phase I PACU    Last vitals:   115/71, t 97.4, 110%, RR 21, HR 72    Post pain: Patient not complaining of pain, continue current therapy      Mental Status:awake    Respiratory Function: tolerating face mask    Cardiovascular: stable    Nausea/Vomiting: patient not complaining of nausea or vomiting    Hydration Status: adequate    Post assessment: no apparent anesthetic complications    Signed by: Collene Schlichter, MD  04/21/23 10:55 AM

## 2023-04-21 NOTE — Anesthesia Preprocedure Evaluation (Addendum)
Anesthesia Evaluation    AIRWAY    Mallampati: II    TM distance: >3 FB  Neck ROM: full  Mouth Opening:full   CARDIOVASCULAR    regular       DENTAL                     PULMONARY    pulmonary exam normal     OTHER FINDINGS    Structural heart note 6/24:  [name] [name] is a 77 y.o. 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 He was admitted to [facility] in February and March 2024 due to worsening heart failure.  He spent time in the ICU on inotropes and unfortunately his renal function did not improve.   He has completed LVAD evaluation and unfortunately he has not a candidate due to worsening renal function.  He is referred for evaluation of his severe functional mitral regurgitation.       [name] [name] presents independently today.  Symptomatically, he has shortness of breath with exertion and lower extremity swelling.  He has been on a walking program and is actually significantly improved his stamina.  Denies any chest pain, presyncope or syncope.  Notes he still has lower extremity edema but is hopeful this will improve with medications and ambulation.             Echo 12/2022:  ummary    * Limited echo to evaluate biventricular function.    * The left ventricle is moderately dilated with LVIDd 6.8 cm.    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    * Severe global hypokinesis with regional variation.    * The right ventricular cavity size is mildly dilated.    * Moderately decreased right ventricular systolic function.    * There is severe mitral regurgitation which is functional secondary to  dilated left ventricle.    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    * Compared to the prior study dated 09/08/22, the EF remains severely  reduced  and there is more significant mitral and tricuspid regurgitation.                                     Relevant Problems   ANESTHESIA   (+) Sleep apnea, obstructive      PULMONARY   (+) Acute dyspnea   (+) COPD (chronic obstructive pulmonary disease)   (+) SOB (shortness of breath)   (+) Sleep apnea, obstructive      NEURO/PSYCH   (+) Headaches due to old head injury      CARDIO   (+) Acute exacerbation of CHF (congestive heart failure)   (+) Acute on chronic congestive heart failure, unspecified heart failure type   (+) CAD (coronary artery disease)   (+) CHF (congestive heart failure)   (+) Chronic systolic congestive heart failure   (+) Essential hypertension   (+) Heart attack   (+) SVT (supraventricular tachycardia)      GI   (+) GERD (gastroesophageal reflux disease)   (+) PUD (peptic ulcer disease)      GU/RENAL   (+) Stage 3 chronic kidney disease, unspecified whether  stage 3a or 3b CKD      ENDO   (+) Type 2 diabetes mellitus with complication, with long-term current use of insulin      OTHER   (+) CLL (chronic lymphocytic leukemia)               Anesthesia Plan    ASA 4     MAC               (All pertinent medical records reviewed.    NPO guidelines met, unless otherwise specified or case is emergent.     Risks of anesthesia explained to patient/family; including the most common side effects from anesthesia--nausea, vomiting, and sore throat. Uncommon side effects including but not limited to injury to eyes, lips, teeth, gums, vocal cords, allergic reactions, and nerve injuries also explained. Informed consent obtained and the discussed possibility of anesthesia complications including but not limited to: aspiration, medication reactions; need for intervention including use of pressors, line placement or invasive procedures. Discussed possibility of dental damage, laryngospasm, cardiopulmonary issues, positional injuries and anesthesia options. If MAC sedation is selected then increased possibility of  recall was discussed as well as need for possible jaw thrust maneuver and residual sore jaw. Additional risks associated with patients known health conditions and issues also addressed. Patient/family given the opportunity to ask questions and concerns were resolved. Patient understands and wishes to proceed.    Patient/family voiced understanding and wished to proceed with procedure.     It should also be noted that due to the nature of the electronic medical record, some data may not be completely accurate due to artifact, inaccurate data entry by other staff, etc.       )      intravenous induction   Detailed anesthesia plan: MAC        Post op pain management: per surgeon and PO analgesics    informed consent obtained    ECG reviewed  pertinent labs reviewed           Signed by: Collene Schlichter, MD 04/21/23 8:41 AM

## 2023-04-21 NOTE — Procedures (Signed)
[facility] HEART TRANSESOPHAGEAL ECHOCARDIOGRAM PROCEDURE NOTE    [facility]  HEART AND VASCULAR OPERATING ROOM  [address]  [place] [address]  Dept: [phone]  Loc: [phone]      Indications:   Severe Mitral regurgitation    Findings:     Severe biventricular dysfunction   Severe (functional) MR with MVA 6.0cm2 (MG ) and posterior leaflet 1.2cm in 'grasping zone'  Severe (atriogenic and pacemaker-induced) TR with predominant jet central and postero-septal    Recommendations:     Follow-up with structural heart and advanced heart failure with consideration of TEER (anatomically appropriate but will need to address severe biventricular dysfunction).      George Hugh, MD   [facility]

## 2023-04-21 NOTE — H&P (Signed)
H&P UPDATE     Date Time: 04/21/23 9:58 AM    PROCEDURE:    Transesophageal echocardiogram  INDICATIONS:    Mitral regurgitation    H&P:    The history and physical including past medical, family, and social history were reviewed and there are no significant interval changes from what is currently available in the chart  from prior evaluation. He has no complaints.  He was seen and examined by me prior to the procedure.   ALLERGIES:    Allopurinol   LABS:      Lab Results   Component Value Date    WBC 9.16 04/12/2023    HGB 11.9 (L) 04/12/2023    HCT 39.2 04/12/2023    PLT 111 (L) 04/12/2023    NA 136 04/12/2023    K 4.8 04/12/2023    CL 102 04/12/2023    CO2 21 04/12/2023    MG 2.0 01/04/2023    BUN 43 (H) 04/12/2023    CREAT 2.5 (H) 04/12/2023    EGFR 26.0 (L) 04/12/2023    GLU 272 (H) 04/12/2023       The risks, benefits and alternatives of the procedure have been discussed in detail and   he has indicated that he understands the procedure, indications, and risks inherent to the procedure and is amenable to proceeding.  All questions were answered. Informed consent was signed and verified.      George Hugh, MD, [facility], FASE  [facility]

## 2023-04-21 NOTE — PACU (Signed)
Patient received from [facility] at 1053 s/p TEE. Phase 1 criteria met at 1153. Patient hyperglycemic, BG 220-230s. Given 3 cc insulin IV and then 5 cc insulin IV per Bank MD. BG 192. Patient progressed to phase 2 at 1156. Dr. Raynelle Bring came to bedside for post-op visit. [place] home criteria met at 1220.  Patient A&Ox4 , awake, vitals stable, ready for d/c home. D/C instructions reviewed with patient and wife. Pt escorted via wheelchair home at 1245.

## 2023-04-23 ENCOUNTER — Encounter: Payer: Self-pay | Admitting: Cardiovascular Disease

## 2023-04-26 ENCOUNTER — Encounter (INDEPENDENT_AMBULATORY_CARE_PROVIDER_SITE_OTHER): Payer: Self-pay | Admitting: Family Medicine

## 2023-04-27 MED ORDER — INSULIN LISPRO (1 UNIT DIAL) 100 UNIT/ML SC SOPN
10.0000 [IU] | PEN_INJECTOR | Freq: Three times a day (TID) | SUBCUTANEOUS | 0 refills | Status: DC
Start: 2023-04-27 — End: 2023-05-05

## 2023-04-27 NOTE — Progress Notes (Signed)
Can you please refill for a temporary refill   LOV 03/02/23 and upcoming 06/17/23

## 2023-04-29 NOTE — Progress Notes (Signed)
[name] STRUCTURAL HEART OFFICE VISIT    [facility] OFFICE -CARDIOLOGY  [address]  [place] [address]  Dept: [phone]  Dept Fax: [phone]       Date Time: April 30, 2023  3:17 PM  Patient Name: [name] [name]  Requesting Physician: Zorita Pang, MD     Primary Cardiologist: Luella Cook MD, [facility]   Reason for Consultation:   We had the opportunity of evaluating this patient in the [facility]; referred for further evaluation of mitral regurgitation and consideration of percutaneous valve therapy.  History of Present Illness:     [name] [name] is a 77 y.o. 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 He was admitted to [facility] in February and March 2024 due to worsening heart failure.  He spent time in the ICU on inotropes and unfortunately his renal function did not improve.   He has completed LVAD evaluation and unfortunately he has not a candidate due to worsening renal function.  He is referred for evaluation of his severe functional mitral regurgitation.      [name] [name] presents with his wife, Lajoyce Corners, today.  Symptomatically, he continues to have shortness of breath with exertion and lower extremity swelling.  He continues his walking program which has helped with his stamina.  He has been very compliant with his medications.  Denies any chest pain, presyncope or syncope.  Notes he still has lower extremity edema but is hopeful this will improve with medications and ambulation.  His hope is to have some improved stamina and energy if we intervene on the valve.    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  He lives in [place] in [place] with his wife, who is also a Runner, 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video.    Past Medical History:     Past Medical History:    Diagnosis Date    Acute CHF     NOV  & DEC 2018, - DIALYSIS CATH PLACED Aug 24 2017 TO REMOVE FLUID     Acute systolic (congestive) heart failure 08/2017    CAD (coronary artery disease)     Cardiomyopathy     nonischemic    CHF (congestive heart failure) 08/12/2014, 2013    Chronic obstructive pulmonary disease     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    Coronary artery disease 2011    CORONARY STENT PLACEMENT FOLLOWED BY [facility]    echocardiogram 02/2016, 11/2016, 09/2017, 12/20/2017    GERD (gastroesophageal reflux disease)     Heart attack 2011    and 2014    Hyperlipemia     Hypertension     116/80    ICD (implantable 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     PLACED 2014  [facility]  LAST INTERROGATION April 01 2018 REPORT REQUESTED    Ischemic cardiomyopathy     EF 15% ON ECHO 09-20-2017 DR GARG IN EPIC    Nonischemic cardiomyopathy     NSTEMI (non-ST elevated myocardial infarction)     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    Pacemaker 2014    MEDTRONIC ICD/PACEMAKER COMBO LAST INTERROGATION  12-12 2018    Pneumonia 09/2016    Primary cardiomyopathy     Ischemic cardiomyopathy EF 15% ON ECHO 09-20-2017 DR GARG IN EPIC    Renal insufficiency  Sepsis 08/2017    Syncope and collapse     PRIOR TO ICD/PACEMAKER PLACED 2014    Type 2 diabetes mellitus, controlled DX 1998    A1c ~6.0 (02/2023) FBS~110    Wheeze     PT SEE HIS PMD April 01 2018 RE THIS-TOLD TO SEE PMD BY [facility] JUNE 12 WHEN THEY SAW HIM FOR A CL     Past Surgical History:     Past Surgical History:   Procedure Laterality Date    BIV  11/10/2016    ICD METRONIC  UPGRADE     CARDIAC CATHETERIZATION  03/2010    CORONARY STENT PLACEMENT X 2 PER PT; LM normal, LAD 70-80% distal lesion at apex, 20% lesion mid CFX    CARDIAC CATHETERIZATION  12/2012    CARDIAC DEFIBRILLATOR PLACEMENT  12/2012    [facility]    CARDIAC PACEMAKER PLACEMENT  2014    MEDTRONIC ICD/PACEMAKER PLACED [facility]    CIRCUMCISION  AGE 76    COLONOSCOPY, DIAGNOSTIC (SCREENING)  2009    CORRECTION  HAMMER TOE      duodenal ulcer  1973    STRESS RELATED IN SCHOOL    ECHOCARDIOGRAM, TRANSESOPHAGEAL N/A 04/21/2023    Procedure: ECHOCARDIOGRAM, TRANSESOPHAGEAL;  Surgeon: George Hugh, MD;  [facility]ART [facility];  Service: Cardiology;  Laterality: N/A;  Probe #[id number]    ECHOCARDIOGRAM, TRANSTHORACIC  02/2016 11/2016 09/2017 12/2017    ECHOCARDIOGRAM, TRANSTHORACIC  02/2016,11/2016,12/20/2017    EGD N/A 08/15/2014    Procedure: EGD;  Surgeon: Colon Branch, MD;  Location: [facility];  Service: Gastroenterology;  Laterality: N/A;  egd w/ bx    EGD  1975    EGD  02/11/2023    Procedure: EGD and clip placement;  Surgeon: Doyne Keel, MD;  Location: [facility] OR;  Service: Gastroenterology;;    EGD, COLONOSCOPY N/A 04/04/2018    Procedure: EGD with bxs, COLONOSCOPY with polypectomy and clipping;  Surgeon: Doyne Keel, MD;  Location: [facility];  Service: Gastroenterology;  Laterality: N/A;    FRACTURE SURGERY  AGE 67     RT  ANKLE- CAST APPLIED    HERNIA REPAIR  AGE 76    LEFT INGUINAL HERNIA    MPI nuclear study  06/2016    orthopedic surgery  AGE 32    RT foot corrective - HAMMERTOE    RIGHT HEART CATH Right 09/09/2022    Procedure: Right Heart Cath;  Surgeon: Burna Forts, MD;  Location: [facility];  Service: Cardiovascular;  Laterality: Right;  tx from FO    RIGHT HEART CATH ONLY INCL. CO AND SATS Right 12/22/2022    Procedure: Right Heart Cath ONLY incl. CO and Sats;  Surgeon: Day, Bertram Millard, MD;  Location: [facility] C[facility];  Service: Cardiovascular;  Laterality: Right;  Right Heart Cath with leave in PA cath for milrinone initiation    TONSILLECTOMY AND ADENOIDECTOMY  1954    TUNNELED CATH PLACEMENT (PERMCATH) N/A 12/25/2022    Procedure: [facility];  Surgeon: Tamala Bari, MD;  Location: [facility];  Service: Interventional Radiology;  Laterality: N/A;    TUNNELED CATH REMOVAL (PERMCATH) Right 01/04/2023    Procedure: TUNNELED CATH REMOVAL;  Surgeon:  Rex Kras, MD;  Location: [facility];  Service: Interventional Radiology;  Laterality: Right;     Family History:   family history includes Breast cancer in his mother; Coronary artery disease in his mother; Diabetes in his  brother and mother; Heart attack in his father; Heart attack (age of onset: 74) in his mother; Hypertension in his father and mother.    Social History:   He reports that he has never smoked. He has never used smokeless tobacco. He reports that he does not currently use alcohol. He reports that he does not use drugs.    Medications:   (only cardiac meds were reviewed/managed):   Current Outpatient Medications:     aspirin EC 81 MG EC tablet, Take 1 tablet (81 mg) by mouth daily, Disp: , Rfl:     atorvastatin (LIPITOR) 80 MG tablet, Take 1 tablet (80 mg) by mouth daily, Disp: 90 tablet, Rfl: 3    Continuous Blood Gluc Sensor (FreeStyle Libre 3 Sensor) Misc, Use 1 Device every 14 (fourteen) days, Disp: 6 each, Rfl: 3    hydrALAZINE (APRESOLINE) 50 MG tablet, Take 1 tablet (50 mg) by mouth every 8 (eight) hours, Disp: 270 tablet, Rfl: 3    insulin glargine 100 UNIT/ML injection, Inject 33 Units into the skin every 12 (twelve) hours (Patient taking differently: Inject 33 Units into the skin nightly), Disp: 20 mL, Rfl: 3    insulin lispro, 1 Unit Dial, 100 UNIT/ML pen-injector, Inject 10 Units into the skin 3 (three) times daily before meals, Disp: 9 mL, Rfl: 0    Insulin Pen Needle (B-D UF III MINI PEN NEEDLES) 31G X 5 MM Misc, Use as directed daily, Disp: 200 each, Rfl: 3    isosorbide dinitrate (ISORDIL) 40 MG tablet, Take 1 tablet (40 mg) by mouth 2 (two) times daily, Disp: 180 tablet, Rfl: 3    Jardiance 10 MG tablet, TAKE 1 TABLET(10 MG) BY MOUTH DAILY, Disp: 30 tablet, Rfl: 0    metoprolol succinate XL (TOPROL-XL) 25 MG 24 hr tablet, Take 1 tablet (25 mg) by mouth 2 (two) times daily, Disp: , Rfl:     niacin (NIASPAN) 500 MG CR tablet, Take 1 tablet (500 mg) by mouth 2 (two) times daily,  Disp: 180 tablet, Rfl: 3    pantoprazole (PROTONIX) 40 MG tablet, TAKE 1 TABLET(40 MG) BY MOUTH TWICE DAILY, Disp: 60 tablet, Rfl: 0    spironolactone (ALDACTONE) 25 MG tablet, TAKE 1/2 TABLET(12.5 MG) BY MOUTH DAILY, Disp: 15 tablet, Rfl: 0    torsemide 40 MG Tab, Take 2 tablets (80 mg) by mouth daily, Disp: 60 tablet, Rfl: 0    UNABLE TO FIND, Inject 10 Units into the skin 3 (three) times daily before meals Insulin Lispro, Disp: , Rfl:     FeroSul 325 (65 Fe) MG tablet, Take 1 tablet (325 mg) by mouth daily, Disp: , Rfl:    Allergies:     Allergies   Allergen Reactions    Allopurinol Edema     Physical Exam:     Vitals:    04/30/23 1448   BP: 118/82   Pulse: 80        General Appearance:  Well-appearing in no acute distress. Well groomed, well nourished  Skin: Warm and dry to touch, no apparent skin lesions, or masses noted.  Head: Normocephalic, hair pattern, no masses or tenderness   Eyes: EOMS Intact, PERRL, conjunctivae and lids unremarkable.  ENT: masked   Neck: JVP normal, no carotid bruit, thyroid not enlarged   Chest: Clear to auscultation bilaterally with good air movement and respiratory effort and no wheezes, rales, or rhonchi   Cardiovascular: Regular rhythm, S1 normal, 3/6 holosystolic ejection murmur heard at the left lower  sternal border. Apical impulse not displaced. No gallops or rubs detected  Abdomen: Soft, nontender, nondistended, with normoactive bowel sounds. No organomegaly.  No pulsatile masses, or bruits.   Extremities: Warm.  2+ lower extremity edema bilaterally.  No clubbing, or cyanosis. All peripheral pulses are full and equal.   Neuro: Alert and oriented x3. No gross motor or sensory deficits noted, affect appropriate.    Imaging studies:     I personally reviewed the following studies:    RHC 12/22/22  RA 15/15/11  RV 48/1/14  PA 48/21/28  PCWP 17/18/17  RA sat 58.9 (pulsox 99%  FickCO 4.2 L/min  FickCI 2.0 L/min/m2  ThermCO 2.8 L/min  ThermCI 1.3 L/min/m2  PVR 2.1 wu (Fick) 3.1 wu  (Therm)    TTE 12/21/22  Summary    * Limited echo to evaluate biventricular function.    * The left ventricle is moderately dilated with LVIDd 6.8 cm.    * Left ventricular systolic function is severely decreased with an ejection fraction by Biplane Method of Discs of  11 %.    * Severe global hypokinesis with regional variation.    * The right ventricular cavity size is mildly dilated.    * Moderately decreased right ventricular systolic function.    * There is severe mitral regurgitation which is functional secondary to  dilated left ventricle.    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    * Moderate pulmonary hypertension with estimated right ventricular systolic pressure of 51 mmHg.    * Compared to the prior study dated 09/08/22, the EF remains severely reduced and there is more significant mitral and tricuspid regurgitation.    TEE 04/21/23  Summary    * Focused TEE due to severe cardiomyopathy (gastric imaging deferred).    * Conclusions: Severe MR (functional) primarily at A2-P2; 3D MVA 6.0cm2 (MG  at 81bpm); Posterior leaflet 1.3cm; Severe TR (atriogenic and  pacemaker-induced).    * ---------------------.    * The left ventricle is moderately dilated.    * Left ventricular ejection fraction is severely decreased with an estimated  ejection fraction of <20%.    * The right ventricular cavity size is mildly dilated.    * Moderately decreased right ventricular systolic function.    * There is annular dilatation secondary to atrial and ventricular  enlargement with bileaflet tethering with resultant severe mitral  regurgitation at A2/P2.  The posterior leaflet in the 'grasping zone' is  1.3cm.    * The 3D MVA is 6.0cm2 (MG at 81bpm).    * The 3D EROA of the regurgitant jet is 0.59cm2.    * There is severe tricuspid regurgitation (atriogenic and pacemaker-induced)  emanating central to the pacemaker located in the postero-septal commissure.    * The 3D EROA of the regurgitant jet is 0.78cm2.    * No  prior TEE is available for comparison.     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 We discussed that if left untreated this would likely be progressive.  We spoke on the rationale of transcatheter  edge-to-edge repair with Mitraclip. We reviewed associated risks, including, but not limited to: bleeding, vessel damage, pericardial effusion/tamponade, stroke and death. Patient is interested in pursuing evaluation.     Next Steps:  Continue atorvastatin, Jardiance, hydralazine, isosorbide dinitrate, metoprolol succinate, spironolactone, torsemide  Discussed with heart failure, we think it would be reasonable to pursue MitraClip in the setting and can invasively test the hemodynamics with placement of the clip prior to release.  Additionally, we will plan to bring the patient in 1 to 2 days at a time for medical optimization and initiation of inotropes to help him get through the procedure.  Referral to valve clinic to consult with CT surgery as part of the HEART team evaluation.    Thank you and please contact me with any questions    Signed by: Tawanna Sat, MD  Structural Heart    This note was generated by the Dragon speech recognition and may contain errors or omissions not intended by the user. Grammatical errors, random word insertions, deletions, pronoun errors, and incomplete sentences are occasional consequences of this technology due to software limitations. Not all errors are caught or corrected. If there are questions or concerns about the content of this note or information contained within the body of  this dictation, they should be addressed directly with the author for clarification.

## 2023-04-30 ENCOUNTER — Ambulatory Visit (INDEPENDENT_AMBULATORY_CARE_PROVIDER_SITE_OTHER): Payer: Commercial Managed Care - POS | Admitting: Interventional Cardiology

## 2023-04-30 ENCOUNTER — Encounter (INDEPENDENT_AMBULATORY_CARE_PROVIDER_SITE_OTHER): Payer: Self-pay | Admitting: Interventional Cardiology

## 2023-04-30 VITALS — BP 118/82 | HR 80 | Ht 72.0 in | Wt 187.0 lb

## 2023-04-30 DIAGNOSIS — I10 Essential (primary) hypertension: Secondary | ICD-10-CM

## 2023-04-30 DIAGNOSIS — I340 Nonrheumatic mitral (valve) insufficiency: Secondary | ICD-10-CM

## 2023-04-30 DIAGNOSIS — I428 Other cardiomyopathies: Secondary | ICD-10-CM

## 2023-05-03 ENCOUNTER — Other Ambulatory Visit (INDEPENDENT_AMBULATORY_CARE_PROVIDER_SITE_OTHER): Payer: Commercial Managed Care - POS | Admitting: Cardiovascular Disease

## 2023-05-03 ENCOUNTER — Telehealth (INDEPENDENT_AMBULATORY_CARE_PROVIDER_SITE_OTHER): Payer: Self-pay

## 2023-05-03 ENCOUNTER — Telehealth (INDEPENDENT_AMBULATORY_CARE_PROVIDER_SITE_OTHER): Payer: Self-pay | Admitting: Family Medicine

## 2023-05-03 NOTE — Telephone Encounter (Signed)
Patient called inquiring about lab results if they were sent to Dr Toula Moos. I was not able to see that so I sent them today

## 2023-05-03 NOTE — Telephone Encounter (Signed)
[name] [name]    Called pt. Provided my name & # and explained I will be in touch by 7/18 to discuss next steps. Pt understood and verbally acknowledged the information.

## 2023-05-04 ENCOUNTER — Telehealth (INDEPENDENT_AMBULATORY_CARE_PROVIDER_SITE_OTHER): Payer: Self-pay

## 2023-05-04 NOTE — Telephone Encounter (Signed)
[facility]    Called pt. Explained that I will f/u w/him after his appt w/Dr Webb Silversmith on 7/26. Pt aware that [facility] surgical consult will depend on results from the 7/26 and whether he is optimized. Advised pt that I will f/u w/him on 7/29 either way. Pt understood and verbally acknowledged the information.

## 2023-05-05 ENCOUNTER — Encounter (INDEPENDENT_AMBULATORY_CARE_PROVIDER_SITE_OTHER): Payer: Self-pay | Admitting: Family Medicine

## 2023-05-05 ENCOUNTER — Other Ambulatory Visit (INDEPENDENT_AMBULATORY_CARE_PROVIDER_SITE_OTHER): Payer: Self-pay | Admitting: Family Medicine

## 2023-05-05 ENCOUNTER — Ambulatory Visit (INDEPENDENT_AMBULATORY_CARE_PROVIDER_SITE_OTHER): Payer: Commercial Managed Care - POS | Admitting: Family Medicine

## 2023-05-05 VITALS — BP 101/64 | HR 72 | Temp 98.1°F | Wt 186.0 lb

## 2023-05-05 DIAGNOSIS — Z09 Encounter for follow-up examination after completed treatment for conditions other than malignant neoplasm: Secondary | ICD-10-CM

## 2023-05-05 DIAGNOSIS — K922 Gastrointestinal hemorrhage, unspecified: Secondary | ICD-10-CM

## 2023-05-05 DIAGNOSIS — I10 Essential (primary) hypertension: Secondary | ICD-10-CM

## 2023-05-05 DIAGNOSIS — N183 Chronic kidney disease, stage 3 unspecified: Secondary | ICD-10-CM

## 2023-05-05 DIAGNOSIS — Z794 Long term (current) use of insulin: Secondary | ICD-10-CM

## 2023-05-05 DIAGNOSIS — E118 Type 2 diabetes mellitus with unspecified complications: Secondary | ICD-10-CM

## 2023-05-05 MED ORDER — PANTOPRAZOLE SODIUM 40 MG PO TBEC
40.0000 mg | DELAYED_RELEASE_TABLET | Freq: Two times a day (BID) | ORAL | 3 refills | Status: DC
Start: 2023-05-05 — End: 2023-07-07

## 2023-05-05 MED ORDER — INSULIN GLARGINE 100 UNIT/ML SC SOLN
33.0000 [IU] | Freq: Every evening | SUBCUTANEOUS | 3 refills | Status: DC
Start: 2023-05-05 — End: 2023-07-07

## 2023-05-05 MED ORDER — EMPAGLIFLOZIN 10 MG PO TABS
10.0000 mg | ORAL_TABLET | Freq: Every day | ORAL | 3 refills | Status: DC
Start: 2023-05-05 — End: 2023-07-23

## 2023-05-05 MED ORDER — SPIRONOLACTONE 25 MG PO TABS
12.5000 mg | ORAL_TABLET | Freq: Every day | ORAL | 3 refills | Status: DC
Start: 2023-05-05 — End: 2023-05-10

## 2023-05-05 MED ORDER — INSULIN LISPRO (1 UNIT DIAL) 100 UNIT/ML SC SOPN
10.0000 [IU] | PEN_INJECTOR | Freq: Three times a day (TID) | SUBCUTANEOUS | 3 refills | Status: DC
Start: 2023-05-05 — End: 2023-07-23

## 2023-05-05 NOTE — Progress Notes (Signed)
Subjective:      Date: 05/05/2023 11:46 AM   Patient ID: [name] [name] is a 77 y.o. male.    Chief Complaint:  Chief Complaint   Patient presents with    Diabetes     Pt is following up. Pt needs 90 days. Blood glucose was 318 without jardiance for the past week but usually it is 120        HPI:  HPI    GERD patient has longstanding history of acid reflux.  In the past he had stomach bleeding.  He was started on antiacid medication 40 mg twice a day.  I will refill this medication for a year.    DM : Patient ran out of his diabetes medication because it was written for 30-day supply and his insurance would just pay for 90-day supply.  I changed it to a 90-day supply and send it to his pharmacy.  Tries to exercise at least 3 times a week.  Watches type of diet and amount of carbohydrate intake.  Check lab work for diabetes regularly in our clinic.  Patient has a freestyle libre and as long as he is taking his medication it is in the 140 range.    HTN: Hypertension is well controlled at this point.  Patient is checking his blood pressure regularly at least 3-4 times a week.  Blood pressure at home is always in normal range.  Patient avoids adding salt and is doing regular cardiac exercise.  Average 115/80        Problem List:  Patient Active Problem List   Diagnosis    Type 2 diabetes mellitus with complication, with long-term current use of insulin    Heart attack    CAD (coronary artery disease)    Non-ischemic cardiomyopathy    Acute exacerbation of CHF (congestive heart failure)    PUD (peptic ulcer disease)    Acute dyspnea    CHF (congestive heart failure)    Headaches due to old head injury    Essential hypertension    Syncope and collapse    AICD (automatic cardioverter/defibrillator) present    Cardiomyopathy    SOB (shortness of breath)    GERD (gastroesophageal reflux disease)    Hypertensive heart disease without heart failure    Heart failure, unspecified    Nonischemic cardiomyopathy    Pure  hypercholesterolemia    Chronic systolic congestive heart failure    Encounter for implantable defibrillator reprogramming or check    Episode of recurrent major depressive disorder, unspecified depression episode severity    Stage 3 chronic kidney disease, 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    Anxiety and depression    COPD (chronic obstructive pulmonary disease)    HLD (hyperlipidemia)    Sleep apnea, obstructive    Scrotal edema    Hydrocele, unspecified hydrocele type    Acute on chronic congestive heart failure, unspecified heart failure type    Acute on chronic right-sided heart failure    Ischemic cardiomyopathy    CLL (chronic lymphocytic leukemia)    SVT (supraventricular tachycardia)    Heart failure    Iron deficiency anemia secondary to inadequate dietary iron intake    Heart failure, unspecified HF chronicity, unspecified heart failure type    Encounter for removal of tunneled central venous catheter (CVC) with port    GI bleed    Iron deficiency anemia, unspecified iron deficiency anemia type       Current Medications:  Outpatient Medications  Marked as Taking for the 05/05/23 encounter (Office Visit) with Zorita Pang, MD   Medication Sig Dispense Refill    aspirin EC 81 MG EC tablet Take 1 tablet (81 mg) by mouth daily      atorvastatin (LIPITOR) 80 MG tablet Take 1 tablet (80 mg) by mouth daily 90 tablet 3    Continuous Blood Gluc Sensor (FreeStyle Libre 3 Sensor) Misc Use 1 Device every 14 (fourteen) days 6 each 3    hydrALAZINE (APRESOLINE) 50 MG tablet Take 1 tablet (50 mg) by mouth every 8 (eight) hours 270 tablet 3    Insulin Pen Needle (B-D UF III MINI PEN NEEDLES) 31G X 5 MM Misc Use as directed daily 200 each 3    isosorbide dinitrate (ISORDIL) 40 MG tablet Take 1 tablet (40 mg) by mouth 2 (two) times daily 180 tablet 3    metoprolol succinate XL (TOPROL-XL) 25 MG 24 hr tablet Take 1 tablet (25 mg) by mouth 2 (two) times daily      niacin (NIASPAN) 500 MG CR tablet Take 1 tablet (500 mg)  by mouth 2 (two) times daily 180 tablet 3    torsemide 40 MG Tab Take 2 tablets (80 mg) by mouth daily 60 tablet 0    [DISCONTINUED] insulin glargine 100 UNIT/ML injection Inject 33 Units into the skin every 12 (twelve) hours (Patient taking differently: Inject 33 Units into the skin nightly) 20 mL 3    [DISCONTINUED] insulin lispro, 1 Unit Dial, 100 UNIT/ML pen-injector Inject 10 Units into the skin 3 (three) times daily before meals 9 mL 0    [DISCONTINUED] Jardiance 10 MG tablet TAKE 1 TABLET(10 MG) BY MOUTH DAILY 30 tablet 0    [DISCONTINUED] pantoprazole (PROTONIX) 40 MG tablet TAKE 1 TABLET(40 MG) BY MOUTH TWICE DAILY 60 tablet 0    [DISCONTINUED] spironolactone (ALDACTONE) 25 MG tablet TAKE 1/2 TABLET(12.5 MG) BY MOUTH DAILY 15 tablet 0       Allergies:  Allergies   Allergen Reactions    Allopurinol Edema       Past Medical History:  Past Medical History:   Diagnosis Date    Acute CHF     NOV  & DEC 2018, - DIALYSIS CATH PLACED Aug 24 2017 TO REMOVE FLUID     Acute systolic (congestive) heart failure 08/2017    CAD (coronary artery disease)     Cardiomyopathy     nonischemic    CHF (congestive heart failure) 08/12/2014, 2013    Chronic obstructive pulmonary disease     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    Coronary artery disease 2011    CORONARY STENT PLACEMENT FOLLOWED BY [facility]    echocardiogram 02/2016, 11/2016, 09/2017, 12/20/2017    GERD (gastroesophageal reflux disease)     Heart attack 2011    and 2014    Hyperlipemia     Hypertension     116/80    ICD (implantable 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     PLACED 2014  [facility]  LAST INTERROGATION April 01 2018 REPORT REQUESTED    Ischemic cardiomyopathy     EF 15% ON ECHO 09-20-2017 DR GARG IN [facility]    Nonischemic cardiomyopathy     NSTEMI (non-ST elevated myocardial infarction)     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    Pacemaker 2014    MEDTRONIC ICD/PACEMAKER COMBO LAST INTERROGATION  12-12 2018    Pneumonia 09/2016    Primary cardiomyopathy     Ischemic  cardiomyopathy EF 15% ON  ECHO 09-20-2017 DR GARG IN EP[facility]    Renal insufficiency     Sepsis 08/2017    Syncope and collapse     PRIOR TO ICD/PACEMAKER PLACED 2014    Type 2 diabetes mellitus, controlled DX 1998    A1c ~6.0 (02/2023) FBS~110    Wheeze     PT SEE HIS PMD April 01 2018 RE THIS-TOLD TO SEE PMD BY [facility] JUNE 12 WHEN THEY SAW HIM FOR A CL       Past Surgical History:  Past Surgical History:   Procedure Laterality Date    BIV  11/10/2016    ICD METRONIC  UPGRADE     CARDIAC CATHETERIZATION  03/2010    CORONARY STENT PLACEMENT X 2 PER PT; LM normal, LAD 70-80% distal lesion at apex, 20% lesion mid CFX    CARDIAC CATHETERIZATION  12/2012    CARDIAC DEFIBRILLATOR PLACEMENT  12/2012    [facility]    CARDIAC PACEMAKER PLACEMENT  2014    MEDTRONIC ICD/PACEMAKER PLACED [facility]    CIRCUMCISION  AGE 89    COLONOSCOPY, DIAGNOSTIC (SCREENING)  2009    CORRECTION HAMMER TOE      duodenal ulcer  1973    STRESS RELATED IN SCHOOL    ECHOCARDIOGRAM, TRANSESOPHAGEAL N/A 04/21/2023    Procedure: ECHOCARDIOGRAM, TRANSESOPHAGEAL;  Surgeon: George Hugh, MD;  Location: [facility];  Service: Cardiology;  Laterality: N/A;  Probe #[id number]    ECHOCARDIOGRAM, TRANSTHORACIC  02/2016 11/2016 09/2017 12/2017    ECHOCARDIOGRAM, TRANSTHORACIC  02/2016,11/2016,12/20/2017    EGD N/A 08/15/2014    Procedure: EGD;  Surgeon: Colon Branch, MD;  Location: [facility];  Service: Gastroenterology;  Laterality: N/A;  egd w/ bx    EGD  1975    EGD  02/11/2023    Procedure: EGD and clip placement;  Surgeon: Doyne Keel, MD;  Location: [facility] OR;  Service: Gastroenterology;;    EGD, COLONOSCOPY N/A 04/04/2018    Procedure: EGD with bxs, COLONOSCOPY with polypectomy and clipping;  Surgeon: Doyne Keel, MD;  Location: [facility];  Service: Gastroenterology;  Laterality: N/A;    FRACTURE SURGERY  AGE 44     RT  ANKLE- CAST APPLIED    HERNIA REPAIR  AGE 89    LEFT INGUINAL HERNIA    MPI nuclear study   06/2016    orthopedic surgery  AGE 7    RT foot corrective - HAMMERTOE    RIGHT HEART CATH Right 09/09/2022    Procedure: Right Heart Cath;  Surgeon: Burna Forts, MD;  Location: [facility];  Service: Cardiovascular;  Laterality: Right;  tx from FO    RIGHT HEART CATH ONLY INCL. CO AND SATS Right 12/22/2022    Procedure: Right Heart Cath ONLY incl. CO and Sats;  Surgeon: Day, Bertram Millard, MD;  Location: [facility];  Service: Cardiovascular;  Laterality: Right;  Right Heart Cath with leave in PA cath for milrinone initiation    TONSILLECTOMY AND ADENOIDECTOMY  1954    TUNNELED CATH PLACEMENT ([facility]) N/A 12/25/2022    Procedure: [facility];  Surgeon: Tamala Bari, MD;  Location: [facility];  Service: Interventional Radiology;  Laterality: N/A;    TUNNELED CATH REMOVAL ([facility]) Right 01/04/2023    Procedure: TUNNELED CATH REMOVAL;  Surgeon: Rex Kras, MD;  Location: [facility];  Service: Interventional Radiology;  Laterality: Right;       Family History:  Family History  Problem Relation Age of Onset    Breast cancer Mother     Heart attack Mother 72    Hypertension Mother     Diabetes Mother     Coronary artery disease Mother     Diabetes Brother     Heart attack Father     Hypertension Father        Social History:  Social History     Socioeconomic History    Marital status: Married     Spouse name: Teacher, music    Number of children: 0   Occupational History    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, history    Tobacco Use    Smoking status: Never    Smokeless tobacco: Never   Vaping Use    Vaping status: Never Used   Substance and Sexual Activity    Alcohol use: Not Currently    Drug use: No    Sexual activity: Not Currently     Partners: Female     Social Determinants of Health     Financial Resource Strain: Low Risk  (01/17/2023)    Overall Financial Resource Strain (CARDIA)     Difficulty of Paying Living Expenses: Not hard at all   Food Insecurity: No Food Insecurity (02/10/2023)    Hunger  Vital Sign     Worried About Running Out of Food in the Last Year: Never true     Ran Out of Food in the Last Year: Never true   Transportation Needs: No Transportation Needs (02/10/2023)    PRAPARE - Therapist, art (Medical): No     Lack of Transportation (Non-Medical): No   Physical Activity: Insufficiently Active (01/17/2023)    Exercise Vital Sign     Days of Exercise per Week: 1 day     Minutes of Exercise per Session: 10 min   Stress: Stress Concern Present (01/17/2023)    [employer] of Occupational Health - Occupational Stress Questionnaire     Feeling of Stress : To some extent   Social Connections: Moderately Integrated (01/17/2023)    Social Connection and Isolation Panel [NHANES]     Frequency of Communication with Friends and Family: More than three times a week     Frequency of Social Gatherings with Friends and Family: Once a week     Attends Religious Services: More than 4 times per year     Active Member of [employer] or Organizations: No     Attends Banker Meetings: Never     Marital Status: Married   Catering manager Violence: Not At Risk (02/10/2023)    Humiliation, Afraid, Rape, and Kick questionnaire     Fear of Current or Ex-Partner: No     Emotionally Abused: No     Physically Abused: No     Sexually Abused: No   Housing Stability: Low Risk  (02/10/2023)    Housing Stability Vital Sign     Unable to Pay for Housing in the Last Year: No     Number of Places Lived in the Last Year: 2     Unstable Housing in the Last Year: No        The following sections were reviewed this encounter by the provider:   Tobacco  Allergies  Meds  Problems  Med Hx  Surg Hx  Fam Hx         Vitals:  BP 101/64 (BP Site: Left arm, Patient Position: Sitting, Cuff Size: Medium)   Pulse  72   Temp 98.1 F (36.7 C) (Temporal)   Wt 84.4 kg (186 lb)   SpO2 96%   BMI 25.23 kg/m     ROS:  Constitutional: Negative for fever, chills and malaise/fatigue.   Respiratory: Negative for  cough, shortness of breath and wheezing.    Cardiovascular: Negative for chest pain and palpitations.   Gastrointestinal: Negative for abdominal pain, diarrhea and constipation.   Musculoskeletal: Negative for joint pain.   Skin: Negative for rash.   Neurological: Negative for dizziness and headaches.   Psychiatric/Behavioral: The patient does not have insomnia.         Objective:     Physical Exam:  Constitutional: Patient is oriented to person, place, and time. Appears well-developed and well-nourished.   HENT:   Head: Normocephalic and atraumatic.   Eyes: Conjunctivae and EOM are normal. Pupils are equal, round, and reactive to light.   Cardiovascular: Normal rate, regular rhythm and normal heart sounds.  Exam reveals no gallop and no friction rub.    No murmur heard.  Pulmonary/Chest: Effort normal and breath sounds normal. Has no wheezes.   Musculoskeletal: Normal range of motion. Exhibits no edema or tenderness.   Neurological: Patient is alert and oriented to person, place, and time.   Psychiatric: Patient has a normal mood and affect.        Assessment/Plan:       1. Type 2 diabetes mellitus with complication, with long-term current use of insulin  - empagliflozin (Jardiance) 10 MG tablet; Take 1 tablet (10 mg) by mouth daily  Dispense: 90 tablet; Refill: 3  - insulin glargine 100 UNIT/ML injection; Inject 33 Units into the skin nightly  Dispense: 29.7 mL; Refill: 3  - insulin lispro, 1 Unit Dial, 100 UNIT/ML pen-injector; Inject 10 Units into the skin 3 (three) times daily before meals  Dispense: 27 mL; Refill: 3  Patient symptoms are well controlled under current treatment.  Recommend to continue medication as prescribed.    2. Gastrointestinal hemorrhage, unspecified gastrointestinal hemorrhage type  - pantoprazole (PROTONIX) 40 MG tablet; Take 1 tablet (40 mg) by mouth 2 (two) times daily  Dispense: 180 tablet; Refill: 3  Patient symptoms are well controlled under current treatment.  Recommend to  continue medication as prescribed.    3. Essential hypertension  Patient symptoms are well controlled under current treatment.  Recommend to continue medication as prescribed.    4. 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 Renaee Munda for his kidney problems.    Diagnostic Results:   Prognosis: improving  Risks/Benefits of Treatment Options: Risks and benefits of therapy discussed with the patient.  Instructions for Management:   Compliance:   Risk Factor Reduction:   Patient/Family Education:   TOTAL TIME SPENT WITH PATIENT: 25 minutes  TIME SPENT COUNSELING/COORDINATING CARE: more than half the time was spent discussing diagnosis and treatment with the patient  >50% of total time spent with patient was regarding counseling/coordinating of care.      Zorita Pang, MD

## 2023-05-05 NOTE — Progress Notes (Signed)
Have you seen any specialists since your last visit with [place]?  Yes, nephrology and cardiology       The patient was informed that the following HM items are still outstanding:   colonoscopy, eye exam, and advance directive

## 2023-05-05 NOTE — Telephone Encounter (Signed)
[facility] sent medication refill request on the behalf of Dr. Altamease Oiler. Please advise if appropriate     Medication(s) Requested: jardiance 10 mg    Medication(s) last refilled: Apr 12 2023, Qty 30, Refills 0  Last visit: Mar 02 2023  Patient has an upcoming appointment on June 17 2023.

## 2023-05-09 ENCOUNTER — Other Ambulatory Visit (INDEPENDENT_AMBULATORY_CARE_PROVIDER_SITE_OTHER): Payer: Self-pay | Admitting: Family Medicine

## 2023-05-12 NOTE — Telephone Encounter (Signed)
Called Pt and was unable to leave voicemail with callback number. Wanted to inform him on the provider's note. Wanted to know the reason Pt takes medication and if he needed 1 year supply

## 2023-05-13 ENCOUNTER — Encounter (INDEPENDENT_AMBULATORY_CARE_PROVIDER_SITE_OTHER): Payer: Self-pay | Admitting: Family Medicine

## 2023-05-14 ENCOUNTER — Ambulatory Visit (INDEPENDENT_AMBULATORY_CARE_PROVIDER_SITE_OTHER): Payer: Commercial Managed Care - POS | Admitting: Cardiovascular Disease

## 2023-05-14 ENCOUNTER — Encounter (INDEPENDENT_AMBULATORY_CARE_PROVIDER_SITE_OTHER): Payer: Self-pay | Admitting: Cardiovascular Disease

## 2023-05-14 DIAGNOSIS — I5023 Acute on chronic systolic (congestive) heart failure: Secondary | ICD-10-CM

## 2023-05-14 DIAGNOSIS — I428 Other cardiomyopathies: Secondary | ICD-10-CM

## 2023-05-14 LAB — ECG 12-LEAD
Atrial Rate: 79 {beats}/min
P Axis: 70 degrees
P-R Interval: 162 ms
Q-T Interval: 402 ms
QRS Duration: 104 ms
QTC Calculation (Bezet): 460 ms
R Axis: -26 degrees
T Axis: 109 degrees
Ventricular Rate: 79 {beats}/min

## 2023-05-14 NOTE — Progress Notes (Signed)
[place] [facility] FAILURE OFFICE PROGRESS NOTE    HRT [facility] OFFICE -CARDIOLOGY  [address]  [place] [address]  Dept: [phone]  Dept Fax: [phone]       Patient Name: [name] [name]    Date of Visit:  May 14, 2023  Date of Birth: Jul 25, 1946  AGE: 77 y.o.  Medical Record #: [id number]      CHIEF COMPLAINT:    No chief complaint on file.      HISTORY OF PRESENT ILLNESS:    I had the pleasure of seeing [name] today in follow up in the Advanced Heart Failure Clinic at [facility]. He is a pleasant 77 y.o. male who has chronic systolic HF and nonischemic cardiomyopathy with EF 25-30% by echo in August 2022 and EF 21 % by echo in November 2023 when he was admitted for symptoms related to HF.  He was admitted to [facility] in February and March 2024 due to worsening heart failure.  He spent time in the ICU on inotropes and unfortunately his renal function did not improve.   He has completed LVAD evaluation and unfortunately he has not a candidate due to worsening renal function.    He has a Medtronic ICD in place.    Dr. Laurine Blazer is his oncologist for management of CLL.    Since leaving the hospital he has been very good about watching his diet, reducing sodium intake (previously eating fast food frequently), and taking GDMT along with his diuretic.    He was referred for mitral valve clip.  He has completed the transesophageal echocardiogram.  Will be scheduled for mitral valve clip in the near future.    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 Renaee Munda as outpatient  Stage IV CLL, diagnosed in September 2021, 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 At this moment from a CLL standpoint, he is quite stable.  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  He can follow-up with Dr. Webb Silversmith as an outpatient  Appears euvolemic. Continue torsemide dosing as per nephrology  Continue rest of CV meds with aspirin, statin, hydralazine/Isordil, amiodarone 200 mg daily (failed DCCV in CICU; nsvt last few days), and torsemide  Goals of care discussion and  palliative care consulted  Long discussion re: HF education with salt and fluid restriction. He would benefit from further discussion with HF RN navigator tomorrow if here.  Discharge planning for possibly today v tomorrow.  I will order close follow-up with Dr. Webb Silversmith and his NP, Conception Oms  See below    PAST MEDICAL HISTORY: He has a past medical history of Acute CHF, Acute systolic (congestive) heart failure (08/2017), CAD (coronary artery disease), Cardiomyopathy, CHF (congestive heart failure) (08/12/2014, 2013), Chronic obstructive pulmonary disease, CLL (chronic lymphocytic leukemia) (10/26/2022), Coronary artery disease (2011),  echocardiogram (02/2016, 11/2016, 09/2017, 12/20/2017), GERD (gastroesophageal reflux disease), Heart attack (2011), Hyperlipemia, Hypertension, ICD (implantable 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, Ischemic cardiomyopathy, Nonischemic cardiomyopathy, NSTEMI (non-ST elevated myocardial infarction), Nuclear MPI (06/2016), Pacemaker (2014), Pneumonia (09/2016), Primary cardiomyopathy, Renal insufficiency, Sepsis (08/2017), Syncope and collapse, Type 2 diabetes mellitus, controlled (DX 1998), and Wheeze. He has a past surgical history that includes duodenal ulcer (1973); orthopedic surgery (AGE 27); EGD (N/A, 08/15/2014); Circumcision (AGE 66); Fracture surgery (AGE 89); Hernia repair (AGE 66); COLONOSCOPY, DIAGNOSTIC (SCREENING) (2009); Tonsillectomy and adenoidectomy (1954); Cardiac defibrillator placement (12/2012); Cardiac pacemaker placement (2014); EGD (1975); EGD, COLONOSCOPY (N/A, 04/04/2018); ECHOCARDIOGRAM, TRANSTHORACIC (02/2016 11/2016 09/2017 12/2017); MPI nuclear study (06/2016); Correction hammer toe; BIV (11/10/2016); ECHOCARDIOGRAM, TRANSTHORACIC (02/2016,11/2016,12/20/2017); Cardiac catheterization (03/2010); Cardiac catheterization (12/2012); Right Heart Cath (Right, 09/09/2022); Right Heart Cath ONLY incl. CO and Sats (Right, 12/22/2022); Tunneled Cath Placement (Permcath) (N/A, 12/25/2022); Tunneled Cath Removal (Permcath) (Right, 01/04/2023); EGD (02/11/2023); and ECHOCARDIOGRAM, TRANSESOPHAGEAL (N/A, 04/21/2023).    ALLERGIES:   Allergies   Allergen Reactions    Allopurinol Edema       MEDICATIONS:   Current Outpatient Medications:     aspirin EC 81 MG EC tablet, Take 1 tablet (81 mg) by mouth daily, Disp: , Rfl:     atorvastatin (LIPITOR) 80 MG tablet, Take 1 tablet (80 mg) by mouth daily, Disp: 90 tablet, Rfl: 3    Continuous Blood Gluc Sensor (FreeStyle Libre 3 Sensor) Misc, Use 1 Device every 14 (fourteen) days, Disp: 6 each, Rfl: 3    empagliflozin (Jardiance) 10 MG tablet, Take 1 tablet (10 mg)  by mouth daily, Disp: 90 tablet, Rfl: 3    insulin glargine 100 UNIT/ML injection, Inject 33 Units into the skin nightly, Disp: 29.7 mL, Rfl: 3    insulin lispro, 1 Unit Dial, 100 UNIT/ML pen-injector, Inject 10 Units into the skin 3 (three) times daily before meals, Disp: 27 mL, Rfl: 3    Insulin Pen Needle (B-D UF III MINI PEN NEEDLES) 31G X 5 MM Misc, Use as directed daily, Disp: 200 each, Rfl: 3    isosorbide dinitrate (ISORDIL) 40 MG tablet, Take 1 tablet (40 mg) by mouth 2 (two) times daily, Disp: 180 tablet, Rfl: 3    metoprolol succinate XL (TOPROL-XL) 25 MG 24 hr tablet, Take 1 tablet (25 mg) by mouth 2 (two) times daily, Disp: , Rfl:     niacin (NIASPAN) 500 MG CR tablet, Take 1 tablet (500 mg) by mouth 2 (two) times daily, Disp: 180 tablet, Rfl: 3    pantoprazole (PROTONIX) 40 MG tablet, Take 1 tablet (40 mg) by mouth 2 (two) times daily, Disp: 180 tablet, Rfl: 3    spironolactone (ALDACTONE) 25 MG tablet, TAKE 1/2 TABLET(12.5 MG) BY MOUTH DAILY, Disp: 15 tablet, Rfl: 0    torsemide 40 MG Tab, Take 2 tablets (80 mg) by mouth daily, Disp: 60 tablet, Rfl: 0    Toujeo SoloStar 300 UNIT/ML injection  pen, , Disp: , Rfl:     hydrALAZINE (APRESOLINE) 50 MG tablet, Take 1 tablet (50 mg) by mouth every 8 (eight) hours (Patient not taking: Reported on 05/14/2023), Disp: 270 tablet, Rfl: 3     FAMILY HISTORY: family history includes Breast cancer in his mother; Coronary artery disease in his mother; Diabetes in his brother and mother; Heart attack in his father; Heart attack (age of onset: 52) in his mother; Hypertension in his father and mother.    SOCIAL HISTORY: He reports that he has never smoked. He has never used smokeless tobacco. He reports that he does not currently use alcohol. He reports that he does not use drugs.      PHYSICAL EXAMINATION    Visit Vitals  BP 122/86 (BP Site: Right arm, Patient Position: Sitting, Cuff Size: Medium)   Pulse 86   Ht 1.829 m (6')   Wt 81.6 kg (180 lb)   SpO2 98%   BMI 24.41  kg/m           Last 3 Weights:       04/30/2023     2:48 PM 05/05/2023    11:10 AM 05/14/2023    10:08 AM   Weight Monitoring   Height 182.9 cm  182.9 cm   Weight 84.823 kg 84.369 kg 81.647 kg   BMI (calculated) 25.4 kg/m2  24.4 kg/m2         General Appearance:  Cooperative and in no acute distress.    Skin: Warm and dry to touch  Head: Normocephalic  Eyes: Conjunctivae and lids unremarkable.    Neck: No JVD  Chest: Clear to auscultation bilaterally with good respiratory effort and no wheezes, rales, or rhonchi   Cardiovascular: Regular rhythm, S1 normal, S2 normal, 1/6 holosystolic murmur in the mid axillary   Abdomen: Soft, nontender with normoactive bowel sounds.   Extremities: Warm/dry without edema, +2 b/l radial pulses  Neuro: Alert and oriented x3. No gross motor or sensory deficits noted, affect appropriate.    TTE, 09/08/2022:    * Markedly dilated left ventricle with severely reduced systolic function  (LVEDVi 109 mL/m2, ejection fraction 21%).    * Hypertrabeculated left ventricle with severe global hypokinesis.    * Mild aortic valve and moderate mitral valve regurgitation (details below).    * Wire in right-sided chambers with mild to moderate (2+) peri-device  tricuspid valve regurgitation.    * Dilated, poorly responsive IVC.    * When visually compared with the prior study dated 05/29/2021, LV function  is less vigorous (previous EF 31%, now 21%; previous LVOT VTI 17 cm, now 11  cm); valvular regurgitation is less prominent.    TTE, Mar 2024:    * The left ventricle is moderately dilated with LVIDd 6.8 cm.    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    * Severe global hypokinesis with regional variation.    * The right ventricular cavity size is mildly dilated.    * Moderately decreased right ventricular systolic function.    * There is severe mitral regurgitation which is functional secondary to  dilated left  ventricle.    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    * Compared to the prior study dated 09/08/22, the EF remains severely  reduced and there is  more significant mitral and tricuspid regurgitation.    IMPRESSION:   [name] is a 77 y.o. male with the following:    Non-ischemic Cardiomyopathy, EF 21%, NYHA Class 4 at admission, ACC/AHA Stage D, LVIDd 6.9 cm, Medtronic BiV-IC   Multiple hospital admissions in 2023 including Nov 2023 and Feb 2024  [name] spent considerable time in the cardiac ICU and inpatient service at [facility].  With renal function not improving unfortunately he is not a candidate for LVAD.  He was discharged with plans for hospice.  He has been doing quite well since leaving the hospital.  Will continue taking care of him with his regular.    Variable diuretic rx compliance, now compliant along with GDMT  Chronic systolic heart failure  Severe mitral and tricuspid regurgitation  MV clip is being planned in th near future  CKD4  Stage IV CLL, dx 06/2020, started acalabrutinib 04/2022  RLE DVT (02/2022)  T2DM, A1C 5.9%  Dyslipidemia  COPD, stable  Colonoscopy is due and being scheduled      RECOMMENDATIONS:    Follow up with Dr. Laurine Blazer from [facility] regarding CLL.    It is okay to continue his chemotherapy plan for treating CLL  Continue to limit sodium rich foods, adding salt or eating fast food.  Continue torsemide 80 mg daily  Continue metoprolol succinate, Isordil, hydralazine, and spironolactone  Continue to monitor your weight each morning on your home scale.  For a weight gain of 2 pound in a day or 3 pounds in 2-3 days, take an extra dose of torsemide in the morning.  Follow up with our clinic in 3 months  He will be completing a colonoscopy in the near future.  I recommend he continue taking his current cardiac medications, including torsemide, through time of  preporation.  He is a moderate-high risk patient.  Fortunately he recently had an EGD and TEE with what will likely be a similar sedation protocol and he tolerated both very well.    After the MV clip he will be on Plavix therefore it will be better to conduct the colonoscopy prior to the MV clip                                                 Orders Placed This Encounter   Procedures    ECG 12 lead (Normal)       No orders of the defined types were placed in this encounter.        SIGNED:    Marianna Fuss, MD          This note was generated by the Dragon speech recognition and may contain errors or omissions not intended by the user. Grammatical errors, random word insertions, deletions, pronoun errors, and incomplete sentences are occasional consequences of this technology due to software limitations. Not all errors are caught or corrected. If there are questions or concerns about the content of this note or information contained within the body of this dictation, they should be addressed directly with the author for clarification.

## 2023-05-18 ENCOUNTER — Encounter (INDEPENDENT_AMBULATORY_CARE_PROVIDER_SITE_OTHER): Payer: Self-pay | Admitting: Family Medicine

## 2023-05-18 ENCOUNTER — Telehealth (INDEPENDENT_AMBULATORY_CARE_PROVIDER_SITE_OTHER): Payer: Self-pay

## 2023-05-18 NOTE — Telephone Encounter (Signed)
[facility]    Called pt. Pt agreeable to [facility] surgical appt on 8/26 at 1130 and is aware this will be one of two appts - the 2nd is a preop visit. Provided my name & #'s. Pt understood and verbally acknowledged the information.

## 2023-05-31 ENCOUNTER — Ambulatory Visit
Admission: RE | Admit: 2023-05-31 | Payer: Commercial Managed Care - POS | Source: Ambulatory Visit | Admitting: Gastroenterology

## 2023-05-31 ENCOUNTER — Encounter: Admission: RE | Payer: Self-pay | Source: Ambulatory Visit

## 2023-05-31 SURGERY — COLONOSCOPY, WITH BIOPSY
Anesthesia: Monitor Anesthesia Care | Site: Anus

## 2023-06-08 ENCOUNTER — Encounter (INDEPENDENT_AMBULATORY_CARE_PROVIDER_SITE_OTHER): Payer: Self-pay | Admitting: Thoracic Surgery (Cardiothoracic Vascular Surgery)

## 2023-06-11 ENCOUNTER — Telehealth (INDEPENDENT_AMBULATORY_CARE_PROVIDER_SITE_OTHER): Payer: Self-pay

## 2023-06-11 NOTE — Progress Notes (Unsigned)
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 eval.    He was previously evaluated by ADHF team, and determined not be a LVAD candidate. 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 He has seen HF specialist Dr.Welch who has optimized his HF medication regimen as maximally tolerated. He has been evaluated by Dr.El-Haddad on 04/30/23. Dr.El-Haddad felt it would be reasonable to pursue [facility] in the setting that hemodynamics could be monitored invasively before releasing clip intraoperatively. He suggested to bring the pt in 1-2 days at a time for medical optimization and initation of inotropes to help him get through the procedure.       NYHA:   Angina (CCA angina class):    TEE 04/21/2023  Severe MR (functional) primarily at A2-P2; 3D MVA 6.0cm2 (MG  at 81bpm); Posterior leaflet 1.3cm; Severe TR (atriogenic and pacemaker-induced). LVEF<20%, LV moderate dilated, RV mild dilated, There is annular dilatation secondary to atrial and ventricular enlargement with bileaflet tethering with resultant severe mitral regurgitation at A2/P2.  The 3D EROA of the regurgitant jet is 0.59cm2.     Echo 12/2022  LVEF 11%, RV mildly dilated, moderately decreased RV systolic function, severe MR, functional r/t dilated LV. 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    Cardiac Cath:       STS Risk Score:   Risk of Mortality: %   Mortality or Morbidity: %    Past Medical History:    Medical History[1]    Past Surgical History:   Procedure Laterality Date    BIV  11/10/2016    ICD METRONIC  UPGRADE     CARDIAC CATHETERIZATION  03/2010    CORONARY STENT PLACEMENT X 2 PER PT; LM normal, LAD 70-80% distal lesion at apex, 20% lesion mid CFX    CARDIAC CATHETERIZATION  12/2012    CARDIAC DEFIBRILLATOR PLACEMENT  12/2012    [facility]    CARDIAC PACEMAKER PLACEMENT  2014    MEDTRONIC ICD/PACEMAKER PLACED [facility]    CIRCUMCISION  AGE 72    COLONOSCOPY, DIAGNOSTIC (SCREENING)  2009    CORRECTION HAMMER TOE      duodenal ulcer  1973    STRESS RELATED IN SCHOOL    ECHOCARDIOGRAM, TRANSESOPHAGEAL N/A 04/21/2023    Procedure: ECHOCARDIOGRAM, TRANSESOPHAGEAL;  Surgeon: George Hugh, MD;  Location: [facility] OR;  Service: Cardiology;  Laterality: N/A;  Probe #[id number]    ECHOCARDIOGRAM, TRANSTHORACIC  02/2016 11/2016 09/2017 12/2017    ECHOCARDIOGRAM, TRANSTHORACIC  02/2016,11/2016,12/20/2017    EGD N/A 08/15/2014    Procedure: EGD;  Surgeon: Colon Branch, MD;  Location: [facility];  Service: Gastroenterology;  Laterality: N/A;  egd w/ bx    EGD  1975    EGD  02/11/2023    Procedure: EGD and clip placement;  Surgeon: Doyne Keel, MD;  Location:  ENDOSCOPY OR;  Service: Gastroenterology;;    EGD, COLONOSCOPY N/A 04/04/2018  Procedure: EGD with bxs, COLONOSCOPY with polypectomy and clipping;  Surgeon: Doyne Keel, MD;  Location: [facility];  Service: Gastroenterology;  Laterality: N/A;    FRACTURE SURGERY  AGE 20     RT  ANKLE- CAST APPLIED    HERNIA REPAIR  AGE 85    LEFT INGUINAL HERNIA    MPI nuclear study  06/2016    orthopedic surgery  AGE 39    RT foot corrective - HAMMERTOE    REPAIR, HAMMERTOE      RIGHT HEART CATH Right 09/09/2022    Procedure: Right Heart Cath;  Surgeon: Burna Forts, MD;  Location: [facility];  Service: Cardiovascular;  Laterality: Right;  tx from FO    RIGHT HEART CATH ONLY INCL. CO AND SATS Right 12/22/2022     Procedure: Right Heart Cath ONLY incl. CO and Sats;  Surgeon: Day, Bertram Millard, MD;  Location: [facility] C[facility];  Service: Cardiovascular;  Laterality: Right;  Right Heart Cath with leave in PA cath for milrinone initiation    TONSILLECTOMY AND ADENOIDECTOMY  1954    TUNNELED CATH PLACEMENT (PERMCATH) N/A 12/25/2022    Procedure: [facility];  Surgeon: Tamala Bari, MD;  Location: [facility];  Service: Interventional Radiology;  Laterality: N/A;    TUNNELED CATH REMOVAL ([facility]MCATH) Right 01/04/2023    Procedure: TUNNELED CATH REMOVAL;  Surgeon: Rex Kras, MD;  Location: [facility];  Service: Interventional Radiology;  Laterality: Right;       Family History:     Family History   Problem Relation Age of Onset    Breast cancer Mother     Heart attack Mother 73    Hypertension Mother     Diabetes Mother     Coronary artery disease Mother     Diabetes Brother     Heart attack Father     Hypertension Father        Social History:     Social History     Tobacco Use    Smoking status: Never    Smokeless tobacco: Never   Substance Use Topics    Alcohol use: Not Currently       Allergies:   Allergies[2]    Medications:   Home Meds:  Current/Home Medications    ASPIRIN EC 81 MG EC TABLET    Take 1 tablet (81 mg) by mouth daily    ATORVASTATIN (LIPITOR) 80 MG TABLET    Take 1 tablet (80 mg) by mouth daily    CONTINUOUS BLOOD GLUC SENSOR (FREESTYLE LIBRE 3 SENSOR) MISC    Use 1 Device every 14 (fourteen) days    EMPAGLIFLOZIN (JARDIANCE) 10 MG TABLET    Take 1 tablet (10 mg) by mouth daily    HYDRALAZINE (APRESOLINE) 50 MG TABLET    Take 1 tablet (50 mg) by mouth every 8 (eight) hours    INSULIN GLARGINE 100 UNIT/ML INJECTION    Inject 33 Units into the skin nightly    INSULIN LISPRO, 1 UNIT DIAL, 100 UNIT/ML PEN-INJECTOR    Inject 10 Units into the skin 3 (three) times daily before meals    INSULIN PEN NEEDLE (B-D UF III MINI PEN NEEDLES) 31G X 5 MM MISC    Use as directed daily    ISOSORBIDE DINITRATE (ISORDIL) 40 MG  TABLET    Take 1 tablet (40 mg) by mouth 2 (two) times daily    METOPROLOL SUCCINATE XL (TOPROL-XL) 25 MG 24 HR TABLET  Take 1 tablet (25 mg) by mouth 2 (two) times daily    NIACIN (NIASPAN) 500 MG CR TABLET    Take 1 tablet (500 mg) by mouth 2 (two) times daily    PANTOPRAZOLE (PROTONIX) 40 MG TABLET    Take 1 tablet (40 mg) by mouth 2 (two) times daily    SPIRONOLACTONE (ALDACTONE) 25 MG TABLET    TAKE 1/2 TABLET(12.5 MG) BY MOUTH DAILY    TORSEMIDE 40 MG TAB    Take 2 tablets (80 mg) by mouth daily    TOUJEO SOLOSTAR 300 UNIT/ML INJECTION PEN           Review of Systems:   General:  No Weight Gain/Loss.  HEENT:  No Hearing Loss, No Glaucoma  No loose tooth, decayed tooth or recent tooth infection, last dentist checkup ____  Cardiovascular:  No prior MI,No cardiogenic shock w/in 24hrs, No cardiac arrest w/in 24hrs, No cardiac procedure w/in 30 days  Yes heart failure w/in 2 weeks, ({NYHACLASS:55900}), No Edema  No Porcelain aorta, No Abdominal Aortic Aneurysm, No Thoracic Aorta Aneurysm,No Claudication  No carotid Disease(if Yes, Left or Right or both, 50-79% or 80-99% or 100%)  No Hostile Chest  No Rheumatic Fever  Yes heart Murmur  No Atrial Fibrillation/Flutter ({AFib Type:55901})  No conduction defect ({ConductionDisease:55902})  No Pacemaker/ICD(If Yes, Type, Brand and Implantation Date____),  No Syncope, No Presyncope.  Respiratory:  Current/Recent smoker(<1 yrs)____No_____Yes  No asthma,No Chronic Lung Disease ({Lungdiseaseseverity:55903}); No Home Oxygen(if yes,___L/min,PRN or Continuous),  No Dyspnea on Exertion,No orthopnea, No Paroxysmal Nocturnal Dyspnea  No Sleep Apnea ({CPAPBIPAP:55904})  Gastrointestinal:  No Hiatal hernia, No GERD,  No Melena, No Liver Disease(if Yes, Cirrhosis or Hepatitis), No Peptic Ulcer Disease.  Genitourinary: No Renal Insufficiency ({CKDHD:55905})  No Prostate Disease  Hematologic:  No Anemia, No Bleeding Disorders, No Low Platelets  No History of DVT/Pulmonary Embolism  (If Yes, date and site {DVTLocation:55906})  Neurological:  No Seizures, No Anxiety, No Depression, No peripheral Neuropathy,  No Prior Stroke (If Yes, most recent stroke date_________), No Transient Ischemic Attack  Endocrine:  No Diabetes mellitus ({Diabetestherapy:55907})  Other: No Immunosuppressive Rx (30days)  No Joint Pain({JointPain:55908})  No HIV, No Cancer(if Yes, ______type or in Last 5 yrs), No mediastinal Radiation  No Sedentary Lifestyle.  All other ROS negative or Positives as below:    Physical Exam:     There were no vitals filed for this visit.     GENERAL/PSYCH: Cooperative, alert and oriented or other as described:___________.  EYES: Pupils WNL, EOM grossly WNL.  MOUTH: Dentition: WNL, NO Dentures(if yes, Full or Partial at:_______).  NECK: Trachea at Midline/WNL, Thyroid WNL, JVP WNL(if No, JVP at:______________).  CV: rhythm RR, S1/S2: WN, Gallops : None, Murmurs: None or Systolic/Diastolic, grade:______with Radiation to________, No Rubs.  Peripheral Pulses: 0: absent, ->4 bounding, B=Bruit,N=No Bruits.                   Carotids                    Radial                        Fem.                P.T            D.P.  R                    2  N                       2                               2N                    2                  2  L                     2 N                       2                              2N                    2                  2  LUNGS: WNL.  GI: Soft, supple, non-distended. No tenderness to palpation. Bowel sounds are present and normal. No masses or organomegaly noted.  GU: WNL.  SKIN: WNL. SACRUM: WNL or Not Assessed.  EXTREMITIES: WNL/no Edema.  NEURO: CNIII-XI: WNL        Motor&Sensory: WNL, Ambulation:____WNL, or ___with device or __Wheelchair.    Cardiographics:   ECG      Labs Reviewed:                               Rads:   No results found.    Assessment:   1. Preoperative cardiovascular examination  - XR Chest 2 Views; Future      Plan:         Daleen Snook,  NP  [place]  [facility]  [phone]    The patient was educated on procedure, risks and benefits and options for treatment. The patient was given written educational information and shared decision making was utilized.              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    Coronary artery disease 2011    CORONARY STENT PLACEMENT FOLLOWED BY [facility]    echocardiogram 02/2016, 11/2016, 09/2017, 12/20/2017    GERD (gastroesophageal reflux disease)     Heart attack 2011    and 2014    Hyperlipemia     Hypertension     116/80    ICD (implantable 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     PLACED 2014  [facility]  LAST INTERROGATION April 01 2018 REPORT REQUESTED    Ischemic cardiomyopathy     EF 15% ON ECHO 09-20-2017 DR GARG IN EPIC    Nonischemic cardiomyopathy     NSTEMI (non-ST elevated myocardial  infarction) 2012    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

## 2023-06-11 NOTE — Telephone Encounter (Signed)
Structural Heart  Patient confirms updated H&P scheduled on 06/14/2023 @ 11:30AM. Patient understood and verbally acknowledged information given. Patient advised to arrive 15 mins early for appointment, bring photo id, insurance card and a copy of current medication list.

## 2023-06-14 ENCOUNTER — Telehealth (INDEPENDENT_AMBULATORY_CARE_PROVIDER_SITE_OTHER): Payer: Self-pay

## 2023-06-14 ENCOUNTER — Encounter (INDEPENDENT_AMBULATORY_CARE_PROVIDER_SITE_OTHER): Payer: Commercial Managed Care - POS | Admitting: Thoracic Surgery (Cardiothoracic Vascular Surgery)

## 2023-06-14 DIAGNOSIS — Z01810 Encounter for preprocedural cardiovascular examination: Secondary | ICD-10-CM

## 2023-06-14 NOTE — Telephone Encounter (Signed)
[facility]    Pt called and requested to cancel today's appt because he has diarrhea. Pt agreeable to r/s to 9/16 at 1030. Pt aware that his surgery date will be within 30 days of the appt and that he will have preop testing at this appt. Pt understood and verbally acknowledged the information.

## 2023-06-17 ENCOUNTER — Ambulatory Visit (INDEPENDENT_AMBULATORY_CARE_PROVIDER_SITE_OTHER): Payer: Commercial Managed Care - POS | Admitting: Family Medicine

## 2023-06-26 ENCOUNTER — Emergency Department: Payer: Commercial Managed Care - POS

## 2023-06-26 ENCOUNTER — Emergency Department
Admission: EM | Admit: 2023-06-26 | Discharge: 2023-06-26 | Disposition: A | Discharge status: Home / Self Care | Payer: Commercial Managed Care - POS | Attending: Emergency Medicine | Admitting: Emergency Medicine

## 2023-06-26 DIAGNOSIS — G4700 Insomnia, unspecified: Secondary | ICD-10-CM | POA: Insufficient documentation

## 2023-06-26 DIAGNOSIS — D696 Thrombocytopenia, unspecified: Secondary | ICD-10-CM | POA: Insufficient documentation

## 2023-06-26 DIAGNOSIS — R0989 Other specified symptoms and signs involving the circulatory and respiratory systems: Secondary | ICD-10-CM | POA: Insufficient documentation

## 2023-06-26 DIAGNOSIS — I5021 Acute systolic (congestive) heart failure: Secondary | ICD-10-CM | POA: Insufficient documentation

## 2023-06-26 DIAGNOSIS — D649 Anemia, unspecified: Secondary | ICD-10-CM | POA: Insufficient documentation

## 2023-06-26 DIAGNOSIS — R0602 Shortness of breath: Secondary | ICD-10-CM | POA: Insufficient documentation

## 2023-06-26 DIAGNOSIS — N189 Chronic kidney disease, unspecified: Secondary | ICD-10-CM | POA: Insufficient documentation

## 2023-06-26 DIAGNOSIS — N289 Disorder of kidney and ureter, unspecified: Secondary | ICD-10-CM

## 2023-06-26 DIAGNOSIS — R079 Chest pain, unspecified: Secondary | ICD-10-CM

## 2023-06-26 DIAGNOSIS — I130 Hypertensive heart and chronic kidney disease with heart failure and stage 1 through stage 4 chronic kidney disease, or unspecified chronic kidney disease: Secondary | ICD-10-CM | POA: Insufficient documentation

## 2023-06-26 LAB — ECG 12-LEAD
Atrial Rate: 79 {beats}/min
P Axis: 68 degrees
P-R Interval: 150 ms
Q-T Interval: 440 ms
QRS Duration: 106 ms
QTC Calculation (Bezet): 504 ms
R Axis: -8 degrees
T Axis: 75 degrees
Ventricular Rate: 79 {beats}/min

## 2023-06-26 LAB — LAB USE ONLY - MANUAL DIFFERENTIAL
Absolute Bands: 0.23 10*3/uL (ref 0.00–1.00)
Absolute Basophils: 0 10*3/uL (ref 0.00–0.08)
Absolute Eosinophils: 0 10*3/uL (ref 0.00–0.44)
Absolute Lymphocytes: 8.45 10*3/uL — ABNORMAL HIGH (ref 0.42–3.22)
Absolute Monocytes: 0.81 10*3/uL (ref 0.21–0.85)
Absolute Neutrophils: 2.08 10*3/uL (ref 1.10–6.33)
Bands %: 2 %
Basophils %: 0 %
Eosinophils %: 0 %
Lymphocytes %: 73 %
Monocytes %: 7 %
Neutrophils %: 18 %
Platelet Estimate: DECREASED — AB
RBC Morphology: ABNORMAL — AB

## 2023-06-26 LAB — LAB USE ONLY - CBC WITH DIFFERENTIAL
Absolute nRBC: 0.07 10*3/uL — ABNORMAL HIGH (ref <=0.00)
Hematocrit: 33.8 % — ABNORMAL LOW (ref 37.6–49.6)
Hemoglobin: 10.5 g/dL — ABNORMAL LOW (ref 12.5–17.1)
MCH: 28.1 pg (ref 25.1–33.5)
MCHC: 31.1 g/dL — ABNORMAL LOW (ref 31.5–35.8)
MCV: 90.4 fL (ref 78.0–96.0)
Platelet Count: 110 10*3/uL — ABNORMAL LOW (ref 142–346)
Preliminary Absolute Neutrophil Count: 3.02 10*3/uL (ref 1.10–6.33)
RBC: 3.74 10*6/uL — ABNORMAL LOW (ref 4.20–5.90)
RDW: 19 % — ABNORMAL HIGH (ref 11–15)
WBC: 11.57 10*3/uL — ABNORMAL HIGH (ref 3.10–9.50)
nRBC %: 0.6 /100{WBCs} — ABNORMAL HIGH (ref <=0.0)

## 2023-06-26 LAB — COMPREHENSIVE METABOLIC PANEL
ALT: 28 U/L (ref 0–55)
AST (SGOT): 29 U/L (ref 5–41)
Albumin/Globulin Ratio: 1.1 (ref 0.9–2.2)
Albumin: 3.4 g/dL — ABNORMAL LOW (ref 3.5–5.0)
Alkaline Phosphatase: 171 U/L — ABNORMAL HIGH (ref 37–117)
Anion Gap: 11 (ref 5.0–15.0)
BUN: 59 mg/dL — ABNORMAL HIGH (ref 9–28)
Bilirubin, Total: 1.5 mg/dL — ABNORMAL HIGH (ref 0.2–1.2)
CO2: 21 meq/L (ref 17–29)
Calcium: 9.2 mg/dL (ref 7.9–10.2)
Chloride: 106 meq/L (ref 99–111)
Creatinine: 3.8 mg/dL — ABNORMAL HIGH (ref 0.5–1.5)
GFR: 15.7 mL/min/{1.73_m2} — ABNORMAL LOW (ref >=60.0)
Globulin: 3.1 g/dL (ref 2.0–3.6)
Glucose: 260 mg/dL — ABNORMAL HIGH (ref 70–100)
Potassium: 4.4 meq/L (ref 3.5–5.3)
Protein, Total: 6.5 g/dL (ref 6.0–8.3)
Sodium: 138 meq/L (ref 135–145)

## 2023-06-26 LAB — WHOLE BLOOD GLUCOSE POCT: Whole Blood Glucose POCT: 159 mg/dL — ABNORMAL HIGH (ref 70–100)

## 2023-06-26 LAB — MAGNESIUM: Magnesium: 2.4 mg/dL (ref 1.6–2.6)

## 2023-06-26 LAB — LAB USE ONLY - GOLD SST HOLD TUBE

## 2023-06-26 LAB — LAB USE ONLY - LIGHT BLUE - CITRATE HOLD TUBE

## 2023-06-26 LAB — COVID-19 (SARS-COV-2) AND INFLUENZA A/B AND RSV
Influenza A RNA: NEGATIVE
Influenza B RNA: NEGATIVE
Respiratory Syncytial Virus RNA: NEGATIVE
SARS-CoV-2 (COVID-19) RNA: NEGATIVE

## 2023-06-26 LAB — NT-PROBNP: NT-ProBNP: 14384 pg/mL — ABNORMAL HIGH (ref <450)

## 2023-06-26 LAB — HIGH SENSITIVITY TROPONIN-I: hs Troponin: 72.6 ng/L (ref <=35.0)

## 2023-06-26 LAB — HIGH SENSITIVITY TROPONIN-I WITH DELTA
hs Troponin-I Delta: 3
hs Troponin: 69.4 ng/L (ref <=35.0)

## 2023-06-26 NOTE — ED Provider Notes (Signed)
History     Chief Complaint   Patient presents with    Insomnia    Chest Pain     HPI     Medical History[1]    Past Surgical History:   Procedure Laterality Date    BIV  11/10/2016    ICD METRONIC  UPGRADE     CARDIAC CATHETERIZATION  03/2010    CORONARY STENT PLACEMENT X 2 PER PT; LM normal, LAD 70-80% distal lesion at apex, 20% lesion mid CFX    CARDIAC CATHETERIZATION  12/2012    CARDIAC DEFIBRILLATOR PLACEMENT  12/2012    [facility]    CARDIAC PACEMAKER PLACEMENT  2014    MEDTR[employer]IC ICD/PACEMAKER PLACED [facility]    CIRCUMCISION  AGE 77    COLONOSCOPY, DIAGNOSTIC (SCREENING)  2009    CORRECTION HAMMER TOE      duodenal ulcer  1973    STRESS RELATED IN SCHOOL    ECHOCARDIOGRAM, TRANSESOPHAGEAL N/A 04/21/2023    Procedure: ECHOCARDIOGRAM, TRANSESOPHAGEAL;  Surgeon: George Hugh, MD;  Location: [facility] HEART OR;  Service: Cardiology;  Laterality: N/A;  Probe #[id number]    ECHOCARDIOGRAM, TRANSTHORACIC  02/2016 11/2016 09/2017 12/2017    ECHOCARDIOGRAM, TRANSTHORACIC  02/2016,11/2016,12/20/2017    EGD N/A 08/15/2014    Procedure: EGD;  Surgeon: Colon Branch, MD;  Location: [facility];  Service: Gastroenterology;  Laterality: N/A;  egd w/ bx    EGD  1975    EGD  02/11/2023    Procedure: EGD and clip placement;  Surgeon: Doyne Keel, MD;  Location: [facility] OR;  Service: Gastroenterology;;    EGD, COLONOSCOPY N/A 04/04/2018    Procedure: EGD with bxs, COLONOSCOPY with polypectomy and clipping;  Surgeon: Doyne Keel, MD;  Location: [facility];  Service: Gastroenterology;  Laterality: N/A;    FRACTURE SURGERY  AGE 30     RT  ANKLE- CAST APPLIED    HERNIA REPAIR  AGE 77    LEFT INGUINAL HERNIA    MPI nuclear study  06/2016    orthopedic surgery  AGE 74    RT foot corrective - HAMMERTOE    REPAIR, HAMMERTOE      RIGHT HEART CATH Right 09/09/2022    Procedure: Right Heart Cath;  Surgeon: Burna Forts, MD;  Location: [facility];  Service: Cardiovascular;   Laterality: Right;  tx from FO    RIGHT HEART CATH ONLY INCL. CO AND SATS Right 12/22/2022    Procedure: Right Heart Cath ONLY incl. CO and Sats;  Surgeon: Day, Bertram Millard, MD;  Location: [facility];  Service: Cardiovascular;  Laterality: Right;  Right Heart Cath with leave in PA cath for milrinone initiation    TONSILLECTOMY AND ADENOIDECTOMY  1954    TUNNELED CATH PLACEMENT ([facility]) N/A 12/25/2022    Procedure: [facility];  Surgeon: Tamala Bari, MD;  Location: [facility];  Service: Interventional Radiology;  Laterality: N/A;    TUNNELED CATH REMOVAL ([facility]) Right 01/04/2023    Procedure: TUNNELED CATH REMOVAL;  Surgeon: Rex Kras, MD;  Location: [facility];  Service: Interventional Radiology;  Laterality: Right;       Family History   Problem Relation Age of Onset    Breast cancer Mother     Heart attack Mother 24    Hypertension Mother     Diabetes Mother     Coronary artery disease Mother     Diabetes Brother     Heart attack Father  Hypertension Father        Social  Social History[2]    .     Allergies[3]    Home Medications               aspirin EC 81 MG EC tablet     Take 1 tablet (81 mg) by mouth daily     atorvastatin (LIPITOR) 80 MG tablet     Take 1 tablet (80 mg) by mouth daily     Continuous Blood Gluc Sensor (FreeStyle Libre 3 Sensor) Misc     Use 1 Device every 14 (fourteen) days     empagliflozin (Jardiance) 10 MG tablet     Take 1 tablet (10 mg) by mouth daily     hydrALAZINE (APRESOLINE) 50 MG tablet     Take 1 tablet (50 mg) by mouth every 8 (eight) hours     Patient not taking: Reported on 05/14/2023     insulin glargine 100 UNIT/ML injection     Inject 33 Units into the skin nightly     insulin lispro, 1 Unit Dial, 100 UNIT/ML pen-injector     Inject 10 Units into the skin 3 (three) times daily before meals     Insulin Pen Needle (B-D UF III MINI PEN NEEDLES) 31G X 5 MM Misc     Use as directed daily     isosorbide dinitrate (ISORDIL) 40 MG tablet     Take 1 tablet (40 mg)  by mouth 2 (two) times daily     metoprolol succinate XL (TOPROL-XL) 25 MG 24 hr tablet     Take 1 tablet (25 mg) by mouth 2 (two) times daily     niacin (NIASPAN) 500 MG CR tablet     Take 1 tablet (500 mg) by mouth 2 (two) times daily     pantoprazole (PROTONIX) 40 MG tablet     Take 1 tablet (40 mg) by mouth 2 (two) times daily     spironolactone (ALDACTONE) 25 MG tablet     TAKE 1/2 TABLET(12.5 MG) BY MOUTH DAILY     torsemide 40 MG Tab     Take 2 tablets (80 mg) by mouth daily     Toujeo SoloStar 300 UNIT/ML injection pen                  Review of Systems  Pertinent positives:    Insomnia, chest congestion    Constitutional:  Denies fever or chills   Eyes:  Denies change in visual acuity or eye pain  HENT:  Denies sore throat, trouble swallowing   Respiratory:  Denies cough or shortness of breath or wheeze   Cardiovascular:     GI:  Denies abdominal pain, nausea, vomiting or diarrhea  GU:  Denies dysuria, hematuria, frequency or trouble urinating   Musculoskeletal:  Denies other joint pain  Integument:  Denies rash  or wound  Neurologic:  Denies headache, focal weakness, numbness, tingling, speech, vision or gait changes  Psychiatric:  Denies suicidal ideation.    Physical Exam    BP: 115/74, Heart Rate: 86, Temp: 97.4 F (36.3 C), Resp Rate: 16, SpO2: 98 %, Weight: 93.8 kg    Physical Exam    Pertinent Positives:     2+ pitting edema up to his knees bilaterally but he only has a few focal rhonchi rales in his right lower lobe    Constitutional:  Well developed, well nourished, no acute distress, not ill appearing   Eyes:  no discharge or redness  HENT:  Atraumatic, external ears normal, external nose/nares normal, oropharynx moist, no pharyngeal exudates. Neck- normal range of motion  Respiratory:  No respiratory distress, no wheezing, Cardiovascular:  Normal rate, normal rhythm, no murmurs, no gallops, no rubs, normal radial pulses and dpp   GI:  Soft, nondistended, nontender, no rebound, no guarding     Musculoskeletal:   no tenderness, no deformities.   Back- no tenderness  Integument:  Well hydrated, no rash   Neurologic:  Alert & oriented x 3, normal motor function, no focal deficits noted, coordination normal   Psychiatric:  Speech and behavior appropriate       Diagnosis management comments: I, Ferdinand Cava, MD, have been the primary provider for this patient during this Emergency Dept visit.    Oxygen saturation by pulse oximetry is greater than 94%, Normal, none needed.    When I was within 6 feet of this patient I donned the following PPE:  surgical mask y, Gloves Yes,     Chest xray visualized and interpreted independently by me as cardiomegaly    Rhythm:  a sensed and v paced  Ectopy:  None  Rate:  Normal   79  Conduction:  No blocks  ST Segments:  Normal ST segments  T Waves:  No acute T Wave changes  Axis:  Normal  Q Waves:  None seen  Clinical Impression: atrial sensed ventricular paced EKG  Interpreted by physician    Shared decision making process utilized for patient orders including imaging, labs and medications.      Patient Progress  Patient progress: stable      MDM and ED Course     ED Medication Orders (From admission, onward)      None               Medical Decision Making  Patient with a history of "leaky valve that supposed to get surgery in a week or so", over the last week or 2 send increasing episodes and severity of when he tries to sleep he just wakes up feeling like his upper chest and throat is congested and dizzy and that he has to keep rolling over to the side to feel better but really just cannot sleep and it has been worse over the last few days.      Ddx: Most likely CHF versus less likely bronchitis or pneumonia versus doubt mass versus renal failure versus other    He does have 2+ pitting edema up to his knees bilaterally but he only has a few focal rhonchi rales in his right lower lobe otherwise his lungs sound pretty clear pacemaker site looks good he is in no  acute distress    Amount and/or Complexity of Data Reviewed  Labs: ordered. Decision-making details documented in ED Course.  Radiology: ordered and independent interpretation performed. Decision-making details documented in ED Course.  ECG/medicine tests: ordered and independent interpretation performed. Decision-making details documented in ED Course.          ED Course as of 06/29/23 0953   Sat Jun 26, 2023   1319 Pt ambulated well and lied flat well and did not desat.   [NH]   1400 Asked Secretary to page who is on call for Dr. Renaee Munda, nephrology [NH]   [phone] Dr.Adler saw pt in ED and recommended pt take is torsemide twice daily instead of once daily.  [NH]      ED Course User Index  [NH]  Liliane Channel, MD             Procedures  Results       Procedure Component Value Units Date/Time    Whole Blood Glucose POCT [[id number]]  (Abnormal) Collected: 06/26/23 1346    Specimen: Blood, Capillary Updated: 06/26/23 1349     Whole Blood Glucose POCT 159 mg/dL     High Sensitivity Troponin-I at 2 hrs with calculated Delta [[id number]]  (Abnormal) Collected: 06/26/23 1110    Specimen: Blood, Venous Updated: 06/26/23 1206     hs Troponin 69.4 ng/L      hs Troponin-I Delta 3    CBC with Differential (Component) [[id number]]  (Abnormal) Collected: 06/26/23 0839    Specimen: Blood, Venous Updated: 06/26/23 1026     WBC 11.57 x10 3/uL      Hemoglobin 10.5 g/dL      Hematocrit 55.7 %      Platelet Count 110 x10 3/uL      MPV --     RBC 3.74 x10 6/uL      MCV 90.4 fL      MCH 28.1 pg      MCHC 31.1 g/dL      RDW 19 %      nRBC % 0.6 /100 WBC      Absolute nRBC 0.07 x10 3/uL      Preliminary Absolute Neutrophil Count 3.02 x10 3/uL     Manual Differential (Component) [[id number]]  (Abnormal) Collected: 06/26/23 0839    Specimen: Blood, Venous Updated: 06/26/23 1026     Neutrophils % 18 %      Lymphocytes % 73 %      Monocytes % 7 %      Eosinophils % 0 %      Basophils % 0 %      Bands % 2 %      Absolute Neutrophils 2.08 x10 3/uL       Absolute Lymphocytes 8.45 x10 3/uL      Absolute Monocytes 0.81 x10 3/uL      Absolute Eosinophils 0.00 x10 3/uL      Absolute Basophils 0.00 x10 3/uL      Absolute Bands 0.23 x10 3/uL      RBC Morphology Abnormal     Polychromasia Present     Dimorphic RBC Population Present     Smudge Cells Present     Platelet Estimate Decreased     Platelet Clumps Present    COVID-19, Influenza A/B and RSV (Cepheid) (pediatric symptomatic) [[id number]]  (Normal) Collected: 06/26/23 0924    Specimen: Swab from Anterior Nares Updated: 06/26/23 1021     SARS-CoV-2 (COVID-19) RNA Negative     Influenza A RNA Negative     Influenza B RNA Negative     Respiratory Syncytial Virus RNA Negative    Narrative:      Test performed using the Cepheid assay.  Please see Fact Sheets for patients and providers located at: http://www.rice.biz/    Test performed using the Cepheid Xpert Xpress SARS-CoV-2/Flu/RSV assay. This assay is only for use under the Food and Drug Administration's Emergency Use Authorization.  This is a multiplexed real-time RT-PCR test intended for the simultaneous qualitative detection and differentiation of SARS-CoV-2, influenza A, influenza B, and respiratory syncytial virus (RSV) viral RNA.  Viral nucleic acids may persist in vivo, independent of viability. Detection of viral nucleic acid does not imply the presence of infectious virus, or that virus nucleic acid is the cause of clinical symptoms.  If only one viral target is positive but coinfection with multiple targets is suspected, the sample should be re-tested with another FDA cleared, approved, or authorized test, if coinfection would change clinical management.     Negative results do not preclude SARS-CoV-2, influenza A virus, influenza B virus and/or RSV infection and should not be used as the sole basis for treatment or other patient management decisions.     Invalid results may be due to inhibiting substances in the specimen and  recollection should occur.     NT-ProBNP [[id number]]  (Abnormal) Collected: 06/26/23 0839    Specimen: Blood, Venous Updated: 06/26/23 0957     NT-ProBNP 14,384 pg/mL     Magnesium [[id number]]  (Normal) Collected: 06/26/23 0839    Specimen: Blood, Venous Updated: 06/26/23 0949     Magnesium 2.4 mg/dL     High Sensitivity Troponin-I at 0 hrs [[id number]]  (Abnormal) Collected: 06/26/23 0839    Specimen: Blood, Venous Updated: 06/26/23 0921     hs Troponin 72.6 ng/L     Comprehensive Metabolic Panel [[id number]]  (Abnormal) Collected: 06/26/23 0839    Specimen: Blood, Venous Updated: 06/26/23 0916     Glucose 260 mg/dL      BUN 59 mg/dL      Creatinine 3.8 mg/dL      Sodium 629 mEq/L      Potassium 4.4 mEq/L      Chloride 106 mEq/L      CO2 21 mEq/L      Calcium 9.2 mg/dL      Anion Gap 52.8     GFR 15.7 mL/min/1.73 m2      AST (SGOT) 29 U/L      ALT 28 U/L      Alkaline Phosphatase 171 U/L      Albumin 3.4 g/dL      Protein, Total 6.5 g/dL      Globulin 3.1 g/dL      Albumin/Globulin Ratio 1.1     Bilirubin, Total 1.5 mg/dL           Radiology Results (24 Hour)       Procedure Component Value Units Date/Time    US Renal Kidney [[id number]] Collected: 06/26/23 1059    Order Status: Completed Updated: 06/26/23 1111    Narrative:      HISTORY: Renal failure.      COMPARISON: CT of the abdomen and pelvis performed on 03/08/2023.     FINDINGS:  Right kidney: 13.2  cm. Elevated cortical echogenicity. No hydronephrosis.  No obvious solid renal mass. No renal calculi. Multiple scattered cysts  throughout the kidney; for instance a dominant 8.3 x 5.1 x 6.4 cm simple  appearing cyst.    Left kidney: 12.6  cm. Elevated cortical echogenicity. No hydronephrosis.  No renal calculi. Multiple scattered simple appearing cysts; for instance a  dominant 14.6 x 7.7 x 13.4 cm simple appearing cyst. Complex 6.3 x 4.1 x  7.0 cm nonvascular cystic mass of the lower pole.    Bladder: The bladder is imaged at a volume of 246  ml. The wall  thickness  is normal for the degree of distention. No mass, or diverticulum is  detected. Post-void residual 78  ml. Bilaterally ureteral jets.      Prostate: 2.6  x 2.2  x 3.1  cm, volume 9.3  cc.      Impression:         1. Polycystic kidneys.  2. Complex cystic mass of the lower pole the left  kidney without internal  vascularity likely representing a hemorrhagic cyst. 3 month follow-up is  recommended.     Fonnie Mu, DO  06/26/2023 11:09 AM    Chest AP Portable [[id number]] Collected: 06/26/23 0845    Order Status: Completed Updated: 06/26/23 0848    Narrative:      HISTORY: Chest pain     COMPARISON: 12/31/2022    FINDINGS:   Moderately enlarged cardiac silhouette is stable.  Cardiac ICD device is unchanged.  No large pleural effusion or pneumothorax  No focal lung consolidation.  No acute osseous deformities.      Impression:        Stable cardiomegaly.    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   Shortness of breath        ED Disposition       ED Disposition   Discharge from ED Extended Visit    Condition   --    Date/Time   Sat Jun 26, 2023  3:12 PM    Comment   --                Discharge Medication List as of 06/26/2023  3:15 PM                    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    Coronary artery disease 2011    CORONARY STENT PLACEMENT FOLLOWED BY [facility]    echocardiogram 02/2016, 11/2016, 09/2017, 12/20/2017    GERD (gastroesophageal  reflux disease)     Heart attack 2011    and 2014    Hyperlipemia     Hypertension     116/80    ICD (implantable 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     PLACED 2014  [facility]  LAST INTERROGATION April 01 2018 REPORT REQUESTED    Ischemic cardiomyopathy     EF 15% ON ECHO 09-20-2017 DR GARG IN EPIC    Nonischemic cardiomyopathy     NSTEMI (non-ST elevated myocardial infarction) 2012    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    Pacemaker 2014    [employer] ICD/PACEMAKER COMBO LAST INTERROGATION  12-12 2018    Pneumonia 09/2016    Primary cardiomyopathy     Ischemic cardiomyopathy EF 15% ON ECHO 09-20-2017 DR GARG IN EPIC    Renal insufficiency     Sepsis 08/2017    Syncope and collapse     PRIOR TO ICD/PACEMAKER PLACED 2014    Type 2 diabetes mellitus, controlled DX 1998    A1c ~6.0 (02/2023) FBS~110    [place]eze     PT SEE HIS PMD April 01 2018 RE THIS-TOLD TO SEE PMD BY  HEART JUNE 12 WHEN THEY SAW HIM FOR A CL   [2]  Social History  Tobacco Use    Smoking status: Never    Smokeless tobacco: Never   Vaping Use    Vaping status: Never Used   Substance Use Topics    Alcohol use: Not Currently    Drug use: No   [3]   Allergies  Allergen Reactions    Allopurinol Edema        Liliane Channel, MD  06/29/23 [phone]

## 2023-06-26 NOTE — ED Triage Notes (Signed)
Haven't been able to sleep for 2 weeks.  Having hemorrhoid pain starting this morning.    Pt not specific about symptoms.  Pt states his Leukemia doctor told him it was due to low iron.

## 2023-06-26 NOTE — Discharge Instructions (Addendum)
YOU MAY SEE THE DOCTOR LISTED IF YOUR DOCTOR IS NOT AVAILABLE FOR FOLLOW UP IN THE TIME FRAME PROVIDED.  RETURN TO THE EMERGENCY DEPARTMENT IMMEDIATELY FOR ANY WORSENING OR CONCERNS.      AS recommended by Dr. Renaee Munda, Go back to taking 40 milligrams of Torsemide TWICE DAILY.

## 2023-06-28 ENCOUNTER — Encounter (INDEPENDENT_AMBULATORY_CARE_PROVIDER_SITE_OTHER): Payer: Self-pay | Admitting: Family Medicine

## 2023-06-29 ENCOUNTER — Inpatient Hospital Stay
Admission: EM | Admit: 2023-06-29 | Discharge: 2023-07-07 | DRG: 286 | Disposition: A | Discharge status: Hospice | Payer: Commercial Managed Care - POS | Attending: Hospitalist | Admitting: Hospitalist

## 2023-06-29 ENCOUNTER — Ambulatory Visit (INDEPENDENT_AMBULATORY_CARE_PROVIDER_SITE_OTHER): Payer: Commercial Managed Care - POS | Admitting: Family Medicine

## 2023-06-29 ENCOUNTER — Emergency Department: Payer: Commercial Managed Care - POS

## 2023-06-29 DIAGNOSIS — E1122 Type 2 diabetes mellitus with diabetic chronic kidney disease: Secondary | ICD-10-CM | POA: Diagnosis present

## 2023-06-29 DIAGNOSIS — D508 Other iron deficiency anemias: Secondary | ICD-10-CM

## 2023-06-29 DIAGNOSIS — I255 Ischemic cardiomyopathy: Secondary | ICD-10-CM | POA: Diagnosis present

## 2023-06-29 DIAGNOSIS — R0602 Shortness of breath: Secondary | ICD-10-CM

## 2023-06-29 DIAGNOSIS — E663 Overweight: Secondary | ICD-10-CM | POA: Diagnosis present

## 2023-06-29 DIAGNOSIS — E785 Hyperlipidemia, unspecified: Secondary | ICD-10-CM | POA: Diagnosis present

## 2023-06-29 DIAGNOSIS — Z7982 Long term (current) use of aspirin: Secondary | ICD-10-CM

## 2023-06-29 DIAGNOSIS — E872 Acidosis, unspecified: Secondary | ICD-10-CM | POA: Diagnosis present

## 2023-06-29 DIAGNOSIS — E871 Hypo-osmolality and hyponatremia: Secondary | ICD-10-CM | POA: Diagnosis not present

## 2023-06-29 DIAGNOSIS — I428 Other cardiomyopathies: Secondary | ICD-10-CM | POA: Diagnosis present

## 2023-06-29 DIAGNOSIS — N179 Acute kidney failure, unspecified: Secondary | ICD-10-CM | POA: Diagnosis present

## 2023-06-29 DIAGNOSIS — I272 Pulmonary hypertension, unspecified: Secondary | ICD-10-CM | POA: Diagnosis present

## 2023-06-29 DIAGNOSIS — K219 Gastro-esophageal reflux disease without esophagitis: Secondary | ICD-10-CM | POA: Diagnosis present

## 2023-06-29 DIAGNOSIS — I081 Rheumatic disorders of both mitral and tricuspid valves: Secondary | ICD-10-CM | POA: Diagnosis present

## 2023-06-29 DIAGNOSIS — I5084 End stage heart failure: Secondary | ICD-10-CM | POA: Diagnosis present

## 2023-06-29 DIAGNOSIS — Z66 Do not resuscitate: Secondary | ICD-10-CM | POA: Diagnosis present

## 2023-06-29 DIAGNOSIS — I472 Ventricular tachycardia, unspecified: Secondary | ICD-10-CM | POA: Diagnosis not present

## 2023-06-29 DIAGNOSIS — D631 Anemia in chronic kidney disease: Secondary | ICD-10-CM | POA: Diagnosis present

## 2023-06-29 DIAGNOSIS — K922 Gastrointestinal hemorrhage, unspecified: Secondary | ICD-10-CM

## 2023-06-29 DIAGNOSIS — Z7984 Long term (current) use of oral hypoglycemic drugs: Secondary | ICD-10-CM

## 2023-06-29 DIAGNOSIS — E118 Type 2 diabetes mellitus with unspecified complications: Secondary | ICD-10-CM

## 2023-06-29 DIAGNOSIS — I2510 Atherosclerotic heart disease of native coronary artery without angina pectoris: Secondary | ICD-10-CM | POA: Diagnosis present

## 2023-06-29 DIAGNOSIS — Z86718 Personal history of other venous thrombosis and embolism: Secondary | ICD-10-CM

## 2023-06-29 DIAGNOSIS — F32A Depression, unspecified: Secondary | ICD-10-CM

## 2023-06-29 DIAGNOSIS — I471 Supraventricular tachycardia, unspecified: Secondary | ICD-10-CM

## 2023-06-29 DIAGNOSIS — R570 Cardiogenic shock: Secondary | ICD-10-CM | POA: Diagnosis present

## 2023-06-29 DIAGNOSIS — E1165 Type 2 diabetes mellitus with hyperglycemia: Secondary | ICD-10-CM | POA: Diagnosis not present

## 2023-06-29 DIAGNOSIS — D696 Thrombocytopenia, unspecified: Secondary | ICD-10-CM | POA: Diagnosis present

## 2023-06-29 DIAGNOSIS — N189 Chronic kidney disease, unspecified: Secondary | ICD-10-CM

## 2023-06-29 DIAGNOSIS — E876 Hypokalemia: Secondary | ICD-10-CM | POA: Diagnosis not present

## 2023-06-29 DIAGNOSIS — Z794 Long term (current) use of insulin: Secondary | ICD-10-CM

## 2023-06-29 DIAGNOSIS — F419 Anxiety disorder, unspecified: Secondary | ICD-10-CM

## 2023-06-29 DIAGNOSIS — J449 Chronic obstructive pulmonary disease, unspecified: Secondary | ICD-10-CM | POA: Diagnosis present

## 2023-06-29 DIAGNOSIS — Z856 Personal history of leukemia: Secondary | ICD-10-CM

## 2023-06-29 DIAGNOSIS — R030 Elevated blood-pressure reading, without diagnosis of hypertension: Secondary | ICD-10-CM

## 2023-06-29 DIAGNOSIS — I509 Heart failure, unspecified: Secondary | ICD-10-CM | POA: Diagnosis present

## 2023-06-29 DIAGNOSIS — Z515 Encounter for palliative care: Secondary | ICD-10-CM

## 2023-06-29 DIAGNOSIS — N184 Chronic kidney disease, stage 4 (severe): Secondary | ICD-10-CM | POA: Diagnosis present

## 2023-06-29 DIAGNOSIS — I493 Ventricular premature depolarization: Secondary | ICD-10-CM | POA: Diagnosis not present

## 2023-06-29 DIAGNOSIS — I130 Hypertensive heart and chronic kidney disease with heart failure and stage 1 through stage 4 chronic kidney disease, or unspecified chronic kidney disease: Principal | ICD-10-CM | POA: Diagnosis present

## 2023-06-29 DIAGNOSIS — I252 Old myocardial infarction: Secondary | ICD-10-CM

## 2023-06-29 DIAGNOSIS — Z95810 Presence of automatic (implantable) cardiac defibrillator: Secondary | ICD-10-CM

## 2023-06-29 DIAGNOSIS — Z955 Presence of coronary angioplasty implant and graft: Secondary | ICD-10-CM

## 2023-06-29 DIAGNOSIS — N183 Chronic kidney disease, stage 3 unspecified: Secondary | ICD-10-CM

## 2023-06-29 DIAGNOSIS — I5023 Acute on chronic systolic (congestive) heart failure: Secondary | ICD-10-CM | POA: Diagnosis present

## 2023-06-29 DIAGNOSIS — Z8711 Personal history of peptic ulcer disease: Secondary | ICD-10-CM

## 2023-06-29 LAB — LAB USE ONLY - CBC WITH DIFFERENTIAL
Absolute nRBC: 0.17 10*3/uL — ABNORMAL HIGH (ref <=0.00)
Hematocrit: 36.9 % — ABNORMAL LOW (ref 37.6–49.6)
Hemoglobin: 11.4 g/dL — ABNORMAL LOW (ref 12.5–17.1)
MCH: 27.9 pg (ref 25.1–33.5)
MCHC: 30.9 g/dL — ABNORMAL LOW (ref 31.5–35.8)
MCV: 90.2 fL (ref 78.0–96.0)
Platelet Count: 94 10*3/uL — ABNORMAL LOW (ref 142–346)
Preliminary Absolute Neutrophil Count: 3.18 10*3/uL (ref 1.10–6.33)
RBC: 4.09 10*6/uL — ABNORMAL LOW (ref 4.20–5.90)
RDW: 19 % — ABNORMAL HIGH (ref 11–15)
WBC: 10.44 10*3/uL — ABNORMAL HIGH (ref 3.10–9.50)
nRBC %: 1.6 /100{WBCs} — ABNORMAL HIGH (ref <=0.0)

## 2023-06-29 LAB — HIGH SENSITIVITY TROPONIN-I WITH DELTA
hs Troponin-I Delta: 10
hs Troponin: 67.9 ng/L (ref <=35.0)

## 2023-06-29 LAB — LAB USE ONLY - MANUAL DIFFERENTIAL
Absolute Bands: 0 10*3/uL (ref 0.00–1.00)
Absolute Basophils: 0 10*3/uL (ref 0.00–0.08)
Absolute Eosinophils: 0 10*3/uL (ref 0.00–0.44)
Absolute Lymphocytes: 6.58 10*3/uL — ABNORMAL HIGH (ref 0.42–3.22)
Absolute Monocytes: 0.31 10*3/uL (ref 0.21–0.85)
Absolute Neutrophils: 3.55 10*3/uL (ref 1.10–6.33)
Bands %: 0 %
Basophils %: 0 %
Eosinophils %: 0 %
Lymphocytes %: 63 %
Monocytes %: 3 %
Neutrophils %: 34 %
Platelet Estimate: DECREASED — AB
RBC Morphology: ABNORMAL — AB

## 2023-06-29 LAB — COMPREHENSIVE METABOLIC PANEL
ALT: 53 U/L (ref 0–55)
AST (SGOT): 62 U/L — ABNORMAL HIGH (ref 5–41)
Albumin/Globulin Ratio: 1 (ref 0.9–2.2)
Albumin: 3.6 g/dL (ref 3.5–5.0)
Alkaline Phosphatase: 159 U/L — ABNORMAL HIGH (ref 37–117)
Anion Gap: 13 (ref 5.0–15.0)
BUN: 59 mg/dL — ABNORMAL HIGH (ref 9–28)
Bilirubin, Total: 1.7 mg/dL — ABNORMAL HIGH (ref 0.2–1.2)
CO2: 17 meq/L (ref 17–29)
Calcium: 9.6 mg/dL (ref 7.9–10.2)
Chloride: 107 meq/L (ref 99–111)
Creatinine: 3.8 mg/dL — ABNORMAL HIGH (ref 0.5–1.5)
GFR: 15.7 mL/min/{1.73_m2} — ABNORMAL LOW (ref >=60.0)
Globulin: 3.6 g/dL (ref 2.0–3.6)
Glucose: 248 mg/dL — ABNORMAL HIGH (ref 70–100)
Potassium: 5.3 meq/L (ref 3.5–5.3)
Protein, Total: 7.2 g/dL (ref 6.0–8.3)
Sodium: 137 meq/L (ref 135–145)

## 2023-06-29 LAB — NT-PROBNP: NT-ProBNP: 14396 pg/mL — ABNORMAL HIGH (ref <450)

## 2023-06-29 LAB — WHOLE BLOOD GLUCOSE POCT
Whole Blood Glucose POCT: 210 mg/dL — ABNORMAL HIGH (ref 70–100)
Whole Blood Glucose POCT: 248 mg/dL — ABNORMAL HIGH (ref 70–100)

## 2023-06-29 LAB — COVID-19 (SARS-COV-2) & INFLUENZA  A/B, NAA (ROCHE LIAT)
Influenza A RNA: NOT DETECTED
Influenza B RNA: NOT DETECTED
SARS-CoV-2 (COVID-19) RNA: NOT DETECTED

## 2023-06-29 LAB — HIGH SENSITIVITY TROPONIN-I: hs Troponin: 58.2 ng/L — ABNORMAL HIGH (ref <=35.0)

## 2023-06-29 LAB — D-DIMER: D-Dimer: 0.94 ug{FEU}/mL — ABNORMAL HIGH (ref <0.50)

## 2023-06-29 LAB — APTT: PTT: 27 s (ref 27–39)

## 2023-06-29 MED ORDER — MELATONIN 3 MG PO TABS
3.0000 mg | ORAL_TABLET | Freq: Every evening | ORAL | Status: DC | PRN
Start: 2023-06-29 — End: 2023-07-07
  Administered 2023-06-30 – 2023-07-06 (×5): 3 mg via ORAL
  Filled 2023-06-29 (×5): qty 1

## 2023-06-29 MED ORDER — FUROSEMIDE 10 MG/ML IJ SOLN
40.0000 mg | Freq: Once | INTRAMUSCULAR | Status: AC
Start: 2023-06-29 — End: 2023-06-29
  Administered 2023-06-29: 40 mg via INTRAVENOUS
  Filled 2023-06-29: qty 4

## 2023-06-29 MED ORDER — SENNOSIDES-DOCUSATE SODIUM 8.6-50 MG PO TABS
2.0000 | ORAL_TABLET | Freq: Every evening | ORAL | Status: DC | PRN
Start: 2023-06-29 — End: 2023-07-05
  Administered 2023-07-02 – 2023-07-04 (×3): 2 via ORAL
  Filled 2023-06-29 (×3): qty 2

## 2023-06-29 MED ORDER — HEPARIN (PORCINE) IN D5W 50-5 UNIT/ML-% IV SOLN (UNITS/KG/HR ONLY)
11.4000 [IU]/kg/h | INTRAVENOUS | Status: DC
Start: 2023-06-29 — End: 2023-06-29
  Filled 2023-06-29: qty 500

## 2023-06-29 MED ORDER — ALUM & MAG HYDROXIDE-SIMETH 200-200-20 MG/5ML PO SUSP
15.0000 mL | ORAL | Status: DC | PRN
Start: 2023-06-29 — End: 2023-07-02

## 2023-06-29 MED ORDER — ONDANSETRON HCL 4 MG/2ML IJ SOLN
4.0000 mg | Freq: Four times a day (QID) | INTRAMUSCULAR | Status: DC | PRN
Start: 2023-06-29 — End: 2023-06-30

## 2023-06-29 MED ORDER — HEPARIN SODIUM (PORCINE) 5000 UNIT/ML IJ SOLN
5000.0000 [IU] | Freq: Two times a day (BID) | INTRAMUSCULAR | Status: DC
Start: 2023-06-29 — End: 2023-07-01
  Administered 2023-06-29 – 2023-06-30 (×3): 5000 [IU] via SUBCUTANEOUS
  Filled 2023-06-29 (×4): qty 1

## 2023-06-29 MED ORDER — HEPARIN SODIUM (PORCINE) 5000 UNIT/ML IJ SOLN
3000.0000 [IU] | INTRAMUSCULAR | Status: DC | PRN
Start: 2023-06-29 — End: 2023-06-29

## 2023-06-29 MED ORDER — NALOXONE HCL 0.4 MG/ML IJ SOLN (WRAP)
0.2000 mg | INTRAMUSCULAR | Status: DC | PRN
Start: 2023-06-29 — End: 2023-07-07

## 2023-06-29 MED ORDER — ACETAMINOPHEN 325 MG PO TABS
650.0000 mg | ORAL_TABLET | ORAL | Status: DC | PRN
Start: 2023-06-29 — End: 2023-07-07
  Administered 2023-06-30 – 2023-07-06 (×3): 650 mg via ORAL
  Filled 2023-06-29 (×3): qty 2

## 2023-06-29 MED ORDER — ASPIRIN 81 MG PO CHEW
324.0000 mg | CHEWABLE_TABLET | Freq: Once | ORAL | Status: AC
Start: 2023-06-29 — End: 2023-06-29
  Administered 2023-06-29: 324 mg via ORAL
  Filled 2023-06-29: qty 4

## 2023-06-29 MED ORDER — ONDANSETRON 4 MG PO TBDP
4.0000 mg | ORAL_TABLET | Freq: Four times a day (QID) | ORAL | Status: DC | PRN
Start: 2023-06-29 — End: 2023-07-07
  Administered 2023-07-06: 4 mg via ORAL
  Filled 2023-06-29: qty 1

## 2023-06-29 NOTE — H&P (Signed)
[facility] H&P Note  Patient Info:   Date/Time: 06/29/2023 / 8:58 PM   Admit Date:06/29/2023  Patient Name:[name] [name]   ZOX:[id number]   PCP: Zorita Pang, MD  Attending Physician:Kailei Cowens, Gerlene Burdock, MD    Assessment/Plan:     # Shortness of breath/orthopnea -suspect symptoms are multifactorial.  Do not think this is secondary to PE but likely progression of disease.  Patient with significant nonischemic cardiomyopathy with low EF along with severe MR and TR.  Ordered IV Lasix for diuresis.  Strict ins and outs and daily weights.  Also ordered antitussives for cough.  VQ scan ordered in the ED.  Can follow-up on study later today.  Also sent secure chat consult request to [facility] cardiology.    D-dimer was 0.94 which is not too far off of her will be normal given age and kidney disease.  Patient was not requiring supplemental oxygen and symptoms more likely consistent with acute on chronic CHF.  VQ scan ordered by the ED and should be done later today.  Follow-up on results.    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  Ordered heart healthy and consistent carbohydrate diet    # COPD -DuoNebs as needed    # History of CLL with thrombocytopenia- sees Dr. 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  Diagnosis: Overweight based on BMI criteria      Recent Labs     06/29/23  1703   Platelet Count 94*     Diagnosis: Moderate Thrombocytopenia     Recent Labs   Lab 06/29/23  1703 06/26/23  0839   Hemoglobin 11.4* 10.5*   Hematocrit 36.9* 33.8*   MCV 90.2 90.4   WBC 10.44* 11.57*   Platelet Count 94* 110*     Recent Labs   Lab 02/11/23  0839 12/24/22  1513 12/24/22  0912 12/22/22  1430 12/21/22  2247   Ferritin 47.17  --   --  57.00  --    Iron 74  --   --   --  32*   TIBC 340  --   --   --  307   Iron Saturation 22  --   --   --  10*   Folate  --  12.6  --   --   --    Haptoglobin  --   --  165  --   --  Anemia Diagnosis: Chronic: Anemia of Chronic Kidney Disease  DVT Prophylaxis:Medication VTE Prophylaxis Orders: heparin (porcine) injection 5,000 Units  Mechanical VTE Prophylaxis Orders: Maintain sequential compression device   Central Line/Foley Catheter/PICC line status: None  Code Status: Full Code  Disposition:home  Type of Admission:Observation   Estimated Length of Stay (including stay in the ER receiving treatment): 1 to 2 days  Milestones required for discharge: Improvement/resolution of symptoms and/or clinical stability.    Clinical Information and History:   Chief Complaint:  Chief Complaint   Patient presents with    Chest Pain    Shortness of Breath    Cough           Past Medical History:Medical History[1]  Past Surgical History:  Past Surgical History:   Procedure Laterality Date    BIV  11/10/2016    ICD METRONIC  UPGRADE     CARDIAC CATHETERIZATION  03/2010    CORONARY STENT PLACEMENT X 2 PER PT; LM normal, LAD 70-80% distal lesion at apex, 20% lesion mid CFX    CARDIAC CATHETERIZATION  12/2012    CARDIAC DEFIBRILLATOR PLACEMENT  12/2012    [place] [facility]    CARDIAC PACEMAKER PLACEMENT   2014    MEDTRONIC ICD/PACEMAKER PLACED [place] [facility]    CIRCUMCISION  AGE 37    COLONOSCOPY, DIAGNOSTIC (SCREENING)  2009    CORRECTION HAMMER TOE      duodenal ulcer  1973    STRESS RELATED IN SCHOOL    ECHOCARDIOGRAM, TRANSESOPHAGEAL N/A 04/21/2023    Procedure: ECHOCARDIOGRAM, TRANSESOPHAGEAL;  Surgeon: George Hugh, MD;  Location: [facility] HEART OR;  Service: Cardiology;  Laterality: N/A;  Probe #[id number]    ECHOCARDIOGRAM, TRANSTHORACIC  02/2016 11/2016 09/2017 12/2017    ECHOCARDIOGRAM, TRANSTHORACIC  02/2016,11/2016,12/20/2017    EGD N/A 08/15/2014    Procedure: EGD;  Surgeon: Colon Branch, MD;  Location: [facility];  Service: Gastroenterology;  Laterality: N/A;  egd w/ bx    EGD  1975    EGD  02/11/2023    Procedure: EGD and clip placement;  Surgeon: Doyne Keel, MD;  Location: [facility] OR;  Service: Gastroenterology;;    EGD, COLONOSCOPY N/A 04/04/2018    Procedure: EGD with bxs, COLONOSCOPY with polypectomy and clipping;  Surgeon: Doyne Keel, MD;  Location: [facility];  Service: Gastroenterology;  Laterality: N/A;    FRACTURE SURGERY  AGE 21     RT  ANKLE- CAST APPLIED    HERNIA REPAIR  AGE 37    LEFT INGUINAL HERNIA    MPI nuclear study  06/2016    orthopedic surgery  AGE 68    RT foot corrective - HAMMERTOE    REPAIR, HAMMERTOE      RIGHT HEART CATH Right 09/09/2022    Procedure: Right Heart Cath;  Surgeon: Burna Forts, MD;  [facility];  Service: Cardiovascular;  Laterality: Right;  tx from FO    RIGHT HEART CATH ONLY INCL. CO AND SATS Right 12/22/2022    Procedure: Right Heart Cath ONLY incl. CO and Sats;  Surgeon: Day, Bertram Millard, MD;  Location: [facility];  Service: Cardiovascular;  Laterality: Right;  Right Heart Cath with leave in PA cath for milrinone initiation    TONSILLECTOMY AND ADENOIDECTOMY  1954    TUNNELED CATH PLACEMENT (PERMCATH) N/A 12/25/2022    Procedure: [facility];  Surgeon: Tamala Bari, MD;  [facility]: [facility];   Service: Interventional Radiology;  Laterality: N/A;  TUNNELED CATH REMOVAL (PERMCATH) Right 01/04/2023    Procedure: TUNNELED CATH REMOVAL;  Surgeon: Rex Kras, MD;  [facility];  Service: Interventional Radiology;  Laterality: Right;     Family History:  Family History   Problem Relation Age of Onset    Breast cancer Mother     Heart attack Mother 40    Hypertension Mother     Diabetes Mother     Coronary artery disease Mother     Diabetes Brother     Heart attack Father     Hypertension Father     Reviewed  Social History:  Social History     Substance and Sexual Activity   Alcohol Use Not Currently     Social History     Substance and Sexual Activity   Drug Use No   Tobacco Use History[2]  Social History     Socioeconomic History    Marital status: Married     Spouse name: Teacher, music    Number of children: 0    Years of education: None    Highest education level: None   Occupational History    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, history    Tobacco Use    Smoking status: Never    Smokeless tobacco: Never   Vaping Use    Vaping status: Never Used   Substance and Sexual Activity    Alcohol use: Not Currently    Drug use: No    Sexual activity: Not Currently     Partners: Female   Other Topics Concern    None   Social History Narrative    None     Social Determinants of Health     Financial Resource Strain: Low Risk  (01/17/2023)    Overall Financial Resource Strain (CARDIA)     Difficulty of Paying Living Expenses: Not hard at all   Food Insecurity: No Food Insecurity (06/26/2023)    Hunger Vital Sign     Worried About Running Out of Food in the Last Year: Never true     Ran Out of Food in the Last Year: Never true   Transportation Needs: No Transportation Needs (06/26/2023)    PRAPARE - Therapist, art (Medical): No     Lack of Transportation (Non-Medical): No   Physical Activity: Insufficiently Active (01/17/2023)    Exercise Vital Sign     Days of Exercise per Week: 1 day      Minutes of Exercise per Session: 10 min   Stress: Stress Concern Present (01/17/2023)    [employer] of Occupational Health - Occupational Stress Questionnaire     Feeling of Stress : To some extent   Social Connections: Moderately Integrated (01/17/2023)    Social Connection and Isolation Panel [NHANES]     Frequency of Communication with Friends and Family: More than three times a week     Frequency of Social Gatherings with Friends and Family: Once a week     Attends Religious Services: More than 4 times per year     Active Member of [employer] or Organizations: No     Attends Banker Meetings: Never     Marital Status: Married   Catering manager Violence: Not At Risk (06/26/2023)    Humiliation, Afraid, Rape, and Kick questionnaire     Fear of Current or Ex-Partner: No     Emotionally Abused: No     Physically Abused: No     Sexually Abused:  No   Housing Stability: Low Risk  (06/26/2023)    Housing Stability Vital Sign     Unable to Pay for Housing in the Last Year: No     Number of Times Moved in the Last Year: 1     Homeless in the Last Year: No     Allergies:Allergies[3]  Medications:Prescriptions Prior to Admission[4]  Clinical Presentation: HPI:     This is a 77 year old male with history of nonischemic cardiomyopathy EF of 10 to 15%, CAD status post NSTEMI and PCI/stent, valvular heart disease, hypertension, hyperlipidemia, type 2 diabetes mellitus-insulin requiring, COPD, history of CLL, CKD stage IV and anemia of chronic disease presents to the ED with complaint of shortness of breath and orthopnea along with fatigue.  Patient was seen in the ED yesterday and was discharged to home.  Patient reports worsening shortness of breath and inability to sleep because he gets orthopneic when he lays flat.  Given symptoms patient came into the ED again for further evaluation.    Upon arrival to the ED patient was hemodynamically stable and afebrile.  Pulse ox was 93 thusly patient was started on oxygen for  support.  Laboratory evaluation notable for BUN/creatinine 59 3.8, initial troponin was 58 and second troponin was 67.  H&H was 11.4 36.9 platelet count 94 proBNP was elevated greater than 14,000.  D-dimer was elevated at 0.94.  Rapid COVID and influenza test were negative.    Chest x-ray showed no acute findings.  EKG showed atrial sensed ventricular paced rhythm rate of 89.  Patient was given 1 dose of IV Lasix 40 mg and 1 full dose aspirin.    Past Surgical History:   Procedure Laterality Date    BIV  11/10/2016    ICD METRONIC  UPGRADE     CARDIAC CATHETERIZATION  03/2010    CORONARY STENT PLACEMENT X 2 PER PT; LM normal, LAD 70-80% distal lesion at apex, 20% lesion mid CFX    CARDIAC CATHETERIZATION  12/2012    CARDIAC DEFIBRILLATOR PLACEMENT  12/2012    [place] [facility]    CARDIAC PACEMAKER PLACEMENT  2014    MEDTRONIC ICD/PACEMAKER PLACED  HEART    CIRCUMCISION  AGE 35    COLONOSCOPY, DIAGNOSTIC (SCREENING)  2009    CORRECTION HAMMER TOE      duodenal ulcer  1973    STRESS RELATED IN SCHOOL    ECHOCARDIOGRAM, TRANSESOPHAGEAL N/A 04/21/2023    Procedure: ECHOCARDIOGRAM, TRANSESOPHAGEAL;  Surgeon: George Hugh, MD;  Location: [facility] OR;  Service: Cardiology;  Laterality: N/A;  Probe #[id number]    ECHOCARDIOGRAM, TRANSTHORACIC  02/2016 11/2016 09/2017 12/2017    ECHOCARDIOGRAM, TRANSTHORACIC  02/2016,11/2016,12/20/2017    EGD N/A 08/15/2014    Procedure: EGD;  Surgeon: Colon Branch, MD;  Location: [facility];  Service: Gastroenterology;  Laterality: N/A;  egd w/ bx    EGD  1975    EGD  02/11/2023    Procedure: EGD and clip placement;  Surgeon: Doyne Keel, MD;  Location: [place] [facility] OR;  Service: Gastroenterology;;    EGD, COLONOSCOPY N/A 04/04/2018    Procedure: EGD with bxs, COLONOSCOPY with polypectomy and clipping;  Surgeon: Doyne Keel, MD;  Location: [facility];  Service: Gastroenterology;  Laterality: N/A;    FRACTURE SURGERY  AGE 89     RT  ANKLE- CAST APPLIED     HERNIA REPAIR  AGE 35    LEFT INGUINAL HERNIA    MPI nuclear study  06/2016    orthopedic surgery  AGE 52    RT foot corrective - HAMMERTOE    REPAIR, HAMMERTOE      RIGHT HEART CATH Right 09/09/2022    Procedure: Right Heart Cath;  Surgeon: Burna Forts, MD;  [facility];  Service: Cardiovascular;  Laterality: Right;  tx from FO    RIGHT HEART CATH ONLY INCL. CO AND SATS Right 12/22/2022    Procedure: Right Heart Cath ONLY incl. CO and Sats;  Surgeon: Day, Bertram Millard, MD;  [facility];  Service: Cardiovascular;  Laterality: Right;  Right Heart Cath with leave in PA cath for milrinone initiation    TONSILLECTOMY AND ADENOIDECTOMY  1954    TUNNELED CATH PLACEMENT ([facility]ATH) N/A 12/25/2022    Procedure: [facility];  Surgeon: Tamala Bari, MD;  Location[facility];  Service: Interventional Radiology;  Laterality: N/A;    TUNNELED CATH REMOVAL ([facility]ATH) Right 01/04/2023    Procedure: TUNNELED CATH REMOVAL;  Surgeon: Rex Kras, MD;  [facility];  Service: Interventional Radiology;  Laterality: Right;    Medical History[5] came with   the chief complaint of Chest Pain, Shortness of Breath, and Cough    Review of Systems:   Chief Complaint:  Chest Pain, Shortness of Breath, and Cough    Review of Systems   Constitutional:  Positive for malaise/fatigue. Negative for chills, diaphoresis and fever.   Respiratory:  Positive for shortness of breath. Negative for cough.         +DOE and orthopnea   Cardiovascular:  Negative for chest pain, palpitations and leg swelling.   Gastrointestinal:  Negative for abdominal pain, blood in stool, diarrhea, nausea and vomiting.   Genitourinary:  Negative for dysuria and frequency.   Musculoskeletal:  Negative for back pain and falls.   Neurological:  Negative for dizziness, focal weakness, loss of consciousness and headaches.     Physical Exam:     Vitals:    06/29/23 1652 06/29/23 2000   BP: (!) 137/94 117/88   Pulse: 91 87   Resp:  (!) 25 15   Temp: 97.9 F (36.6 C)    TempSrc: Axillary    SpO2: 93% 100%   Weight: 87.4 kg (192 lb 10.9 oz)    Height: 1.829 m (6')      Physical Exam:   Physical Exam  Vitals reviewed.   Constitutional:       General: He is not in acute distress.     Appearance: Normal appearance. He is not ill-appearing.   HENT:      Nose: Nose normal.      Comments: +nasal cannula in place  Cardiovascular:      Rate and Rhythm: Normal rate and regular rhythm.   Pulmonary:      Effort: Pulmonary effort is normal.      Breath sounds: Normal breath sounds. No wheezing, rhonchi or rales.   Abdominal:      General: Abdomen is flat.   Musculoskeletal:      Right lower leg: No edema.      Left lower leg: No edema.   Skin:     General: Skin is warm.   Neurological:      General: No focal deficit present.      Mental Status: He is alert. Mental status is at baseline.       Results of Labs/imaging:   Labs have been reviewed:   Coagulation Profile:   Recent  Labs   Lab 06/29/23  1914   PTT 27       CBC review:   Recent Labs   Lab 06/29/23  1703 06/26/23  0839   WBC 10.44* 11.57*   Hemoglobin 11.4* 10.5*   Hematocrit 36.9* 33.8*   Platelet Count 94* 110*   MCV 90.2 90.4   RDW 19* 19*   Neutrophils % 34 18     Chem Review:  Recent Labs   Lab 06/29/23  1703 06/26/23  0839   Sodium 137 138   Potassium 5.3 4.4   Chloride 107 106   CO2 17 21   BUN 59* 59*   Creatinine 3.8* 3.8*   Glucose 248* 260*   Calcium 9.6 9.2   Magnesium  --  2.4   Bilirubin, Total 1.7* 1.5*   AST (SGOT) 62* 29   ALT 53 28   Alkaline Phosphatase 159* 171*     Results       Procedure Component Value Units Date/Time    APTT [[id number]]  (Normal) Collected: 06/29/23 1914    Specimen: Blood, Venous Updated: 06/29/23 2044     PTT 27 sec     High Sensitivity Troponin-I at 2 hrs with calculated Delta [[id number]]  (Abnormal) Collected: 06/29/23 1914    Specimen: Blood, Venous Updated: 06/29/23 2019     hs Troponin 67.9 ng/L      hs Troponin-I Delta 10    D-Dimer [[id number]]   (Abnormal) Collected: 06/29/23 1914    Specimen: Blood, Venous Updated: 06/29/23 1935     D-Dimer 0.94 ug/mL FEU     COVID-19 and Influenza ([place]) (symptomatic) [[id number]]  (Normal) Collected: 06/29/23 1805    Specimen: Swab from Anterior Nares Updated: 06/29/23 1837     SARS-CoV-2 (COVID-19) RNA Not Detected     Influenza A RNA Not Detected     Influenza B RNA Not Detected    Narrative:      A result of "Detected" indicates POSITIVE for the presence of viral RNA  A result of "Not Detected" indicates NEGATIVE for the presence of viral RNA    Test performed using the Roche cobas [place] SARS-CoV-2 & Influenza A/B assay. This is a multiplex real-time RT-PCR assay for the detection of SARS-CoV-2, influenza A, and influenza B virus RNA. Viral nucleic acids may persist in vivo, independent of viability. Detection of viral nucleic acid does not imply the presence of infectious virus, or that virus nucleic acid is the cause of clinical symptoms. Negative results do not preclude SARS-CoV-2, influenza A, and/or influenza B infection and should not be used as the sole basis for diagnosis, treatment or other patient management decisions. Invalid results may be due to inhibiting substances in the specimen and recollection should occur.     NT-ProBNP [[id number]]  (Abnormal) Collected: 06/29/23 1703    Specimen: Blood, Venous Updated: 06/29/23 1829     NT-ProBNP 14,396 pg/mL     CBC with Differential (Order) [[id number]]  (Abnormal) Collected: 06/29/23 1703    Specimen: Blood, Venous Updated: 06/29/23 1808    Narrative:      The following orders were created for panel order CBC with Differential (Order).  Procedure                               Abnormality         Status                     ---------                               -----------         ------  CBC with Differential (C.Marland KitchenMarland Kitchen[[id number]]  Abnormal            Final result               Manual Differential (Com.Marland KitchenMarland Kitchen[[id number]]  Abnormal            Final result                CBC Pathologist Review[[id number]]                           In process                   Please view results for these tests on the individual orders.    CBC with Differential (Component) [[id number]]  (Abnormal) Collected: 06/29/23 1703    Specimen: Blood, Venous Updated: 06/29/23 1808     WBC 10.44 x10 3/uL      Hemoglobin 11.4 g/dL      Hematocrit 08.6 %      Platelet Count 94 x10 3/uL      MPV --     RBC 4.09 x10 6/uL      MCV 90.2 fL      MCH 27.9 pg      MCHC 30.9 g/dL      RDW 19 %      nRBC % 1.6 /100 WBC      Absolute nRBC 0.17 x10 3/uL      Preliminary Absolute Neutrophil Count 3.18 x10 3/uL     Manual Differential (Component) [[id number]]  (Abnormal) Collected: 06/29/23 1703    Specimen: Blood, Venous Updated: 06/29/23 1808     Neutrophils % 34 %      Lymphocytes % 63 %      Monocytes % 3 %      Eosinophils % 0 %      Basophils % 0 %      Bands % 0 %      Absolute Neutrophils 3.55 x10 3/uL      Absolute Lymphocytes 6.58 x10 3/uL      Absolute Monocytes 0.31 x10 3/uL      Absolute Eosinophils 0.00 x10 3/uL      Absolute Basophils 0.00 x10 3/uL      Absolute Bands 0.00 x10 3/uL      RBC Morphology Abnormal     Ovalocytes Present     Polychromasia Present     Dimorphic RBC Population Present     Platelet Estimate Decreased    CBC Pathologist Review [[id number]] Collected: 06/29/23 1703    Specimen: Blood, Venous Updated: 06/29/23 1807    Whole Blood Glucose POCT [[id number]]  (Abnormal) Collected: 06/29/23 1742    Specimen: Blood, Capillary Updated: 06/29/23 1744     Whole Blood Glucose POCT 210 mg/dL     High Sensitivity Troponin-I at 0 hrs [[id number]]  (Abnormal) Collected: 06/29/23 1703    Specimen: Blood, Venous Updated: 06/29/23 1736     hs Troponin 58.2 ng/L     Comprehensive Metabolic Panel [[id number]]  (Abnormal) Collected: 06/29/23 1703    Specimen: Blood, Venous Updated: 06/29/23 1734     Glucose 248 mg/dL      BUN 59 mg/dL      Creatinine 3.8 mg/dL      Sodium 433 mEq/L      Potassium  5.3 mEq/L      Chloride 107 mEq/L      CO2 17 mEq/L      Calcium 9.6 mg/dL  Anion Gap 13.0     GFR 15.7 mL/min/1.73 m2      AST (SGOT) 62 U/L      ALT 53 U/L      Alkaline Phosphatase 159 U/L      Albumin 3.6 g/dL      Protein, Total 7.2 g/dL      Globulin 3.6 g/dL      Albumin/Globulin Ratio 1.0     Bilirubin, Total 1.7 mg/dL           Radiology reports have been reviewed:  Radiology Results (24 Hour)       Procedure Component Value Units Date/Time    Chest AP Portable [[id number]] Collected: 06/29/23 1729    Order Status: Completed Updated: 06/29/23 1733    Narrative:      HISTORY: Chest pain     COMPARISON: September 7    FINDINGS: Left-sided AICD with right atrial, right ventricular, and  coronary sinus leads. Moderate cardiomegaly. Unchanged mediastinal contour.  No pneumothorax, consolidation, or pleural effusion. No evidence of  pulmonary edema.      Impression:       No acute findings.    Wynema Birch, MD  06/29/2023 5:31 PM          Chest AP Portable    Result Date: 06/29/2023  HISTORY: Chest pain COMPARISON: September 7 FINDINGS: Left-sided AICD with right atrial, right ventricular, and coronary sinus leads. Moderate cardiomegaly. Unchanged mediastinal contour. No pneumothorax, consolidation, or pleural effusion. No evidence of pulmonary edema.      No acute findings. Wynema Birch, MD 06/29/2023 5:31 PM    US Renal Kidney    Result Date: 06/26/2023  HISTORY: Renal failure.  COMPARISON: CT of the abdomen and pelvis performed on 03/08/2023. FINDINGS: Right kidney: 13.2  cm. Elevated cortical echogenicity. No hydronephrosis. No obvious solid renal mass. No renal calculi. Multiple scattered cysts throughout the kidney; for instance a dominant 8.3 x 5.1 x 6.4 cm simple appearing cyst. Left kidney: 12.6  cm. Elevated cortical echogenicity. No hydronephrosis. No renal calculi. Multiple scattered simple appearing cysts; for instance a dominant 14.6 x 7.7 x 13.4 cm simple appearing cyst. Complex 6.3 x 4.1 x 7.0  cm nonvascular cystic mass of the lower pole. Bladder: The bladder is imaged at a volume of 246  ml. The wall thickness is normal for the degree of distention. No mass, or diverticulum is detected. Post-void residual 78  ml. Bilaterally ureteral jets.  Prostate: 2.6  x 2.2  x 3.1  cm, volume 9.3  cc.      1. Polycystic kidneys. 2. Complex cystic mass of the lower pole the left kidney without internal vascularity likely representing a hemorrhagic cyst. 3 month follow-up is recommended. Fonnie Mu, DO 06/26/2023 11:09 AM    Chest AP Portable    Result Date: 06/26/2023  HISTORY: Chest pain COMPARISON: 12/31/2022 FINDINGS: Moderately enlarged cardiac silhouette is stable. Cardiac ICD device is unchanged. No large pleural effusion or pneumothorax No focal lung consolidation. No acute osseous deformities.     Stable cardiomegaly. Pankaj Dominica, MD 06/26/2023 8:46 AM    Pathology:   Specimens (From admission, onward)      None          EKG: EKG reviewed   Last EKG Result       Procedure Component Value Units Date/Time    ECG 12 lead [[id number]] Collected: 06/29/23 1655     Updated: 06/29/23 1655     Ventricular Rate 89 BPM  Atrial Rate 89 BPM      P-R Interval 160 ms      QRS Duration 100 ms      Q-T Interval 410 ms      QTC Calculation (Bezet) 498 ms      P Axis 71 degrees      R Axis -26 degrees      T Axis 92 degrees      [facility] MUSE NARRATIVE AND IMPRESSION --     Atrial-sensed ventricular-paced rhythm  ABNORMAL ECG  WHEN COMPARED WITH ECG OF 26-Jun-2023 08:29,  VENTRICULAR RATE HAS INCREASED by  10 bpm      Narrative:      Atrial-sensed ventricular-paced rhythm  ABNORMAL ECG  WHEN COMPARED WITH ECG OF 26-Jun-2023 08:29,  VENTRICULAR RATE HAS INCREASED by  10 bpm          Link to Dispense report for last 100 days  Link to Reconcile Home meds  Hospitalist:   Signed by:   Rayburn Ma, MD  06/29/2023 8:58 PM  Time spent in evaluating and managing the care of the patient including face-to-face and non-face-to-face: 75  minutes    *This note was generated by the Epic EMR system/ Dragon speech recognition and may contain inherent errors or omissions not intended by the user. Grammatical errors, random word insertions, deletions, pronoun errors and incomplete sentences are occasional consequences of this technology due to software limitations. Not all errors are caught or corrected. If there are questions or concerns about the content of this note or information contained within the body of this dictation they should be addressed directly with the author for clarification.           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    Coronary artery disease 2011    CORONARY STENT PLACEMENT FOLLOWED BY [facility]    echocardiogram 02/2016, 11/2016, 09/2017, 12/20/2017    GERD (gastroesophageal reflux disease)     Heart attack 2011    and 2014    Hyperlipemia     Hypertension     116/80    ICD (implantable 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     PLACED 2014  [facility]  LAST INTERROGATION April 01 2018 REPORT REQUESTED    Ischemic cardiomyopathy     EF 15% ON ECHO 09-20-2017 DR GARG IN EPIC    Nonischemic cardiomyopathy     NSTEMI (non-ST elevated myocardial infarction) 2012    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    Pacemaker 2014    [employer] ICD/PACEMAKER COMBO LAST INTERROGATION  12-12 2018    Pneumonia 09/2016    Primary cardiomyopathy     Ischemic cardiomyopathy EF 15% ON ECHO 09-20-2017 DR GARG IN EPIC    Renal insufficiency     Sepsis 08/2017    Syncope and collapse     PRIOR TO ICD/PACEMAKER PLACED 2014    Type 2 diabetes mellitus, controlled DX 1998    A1c ~6.0 (02/2023) FBS~110    [facility]     PT SEE HIS PMD April 01 2018 RE THIS-TOLD TO SEE PMD BY [facility] JUNE 12 WHEN THEY SAW HIM FOR A CL   [2]   Social History  Tobacco Use   Smoking Status Never   Smokeless Tobacco Never   [3]   Allergies  Allergen Reactions    Allopurinol Edema   [4] (Not in a hospital admission)  [5]   Past Medical History:  Diagnosis Date    Acute CHF     NOV  & DEC 2018, - DIALYSIS CATH PLACED Aug 24 2017 TO REMOVE FLUID     Acute systolic (congestive) heart failure 08/2017    CAD (coronary artery disease)     Cardiomyopathy     nonischemic    CHF (congestive heart failure) 08/12/2014, 2013    Chronic obstructive pulmonary disease     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    Coronary artery disease 2011    CORONARY STENT PLACEMENT FOLLOWED BY [facility]    echocardiogram 02/2016, 11/2016, 09/2017, 12/20/2017    GERD (gastroesophageal reflux disease)     Heart attack 2011    and 2014    Hyperlipemia     Hypertension     116/80    ICD (implantable 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     PLACED 2014  [facility]  LAST INTERROGATION April 01 2018 REPORT REQUESTED    Ischemic cardiomyopathy     EF 15% ON ECHO 09-20-2017 DR GARG IN EPIC    Nonischemic cardiomyopathy     NSTEMI (non-ST elevated myocardial infarction) 2012    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

## 2023-06-29 NOTE — ED to IP RN Note (Signed)
[facility]  ED NURSING NOTE FOR THE RECEIVING INPATIENT NURSE   ED NURSE Suszanne Finch [id number]   ED CHARGE RN Leta Jungling   ADMISSION INFORMATION   [name] [name] is a 77 y.o. male admitted with an ED diagnosis of:    1. Acute on chronic congestive heart failure, unspecified heart failure type    2. Chronic kidney disease, unspecified CKD stage         Isolation: None   Allergies: Allopurinol   Holding Orders confirmed? Yes   Belongings Documented? No   Home medications sent to pharmacy confirmed? N/A   NURSING CARE   Patient Comes From:   Mental Status: Home/Family Care  alert and oriented   ADL: Independent with ADLS   Ambulation: no difficulty   Pertinent Information  and Safety Concerns:     [place] Violence Risk Level: Low please call RN     CT / NIH   CT Head ordered on this patient?  No   NIH/Dysphagia assessment done prior to admission? N/A   VITAL SIGNS (at the time of this note)      Vitals:    06/29/23 2000   BP: 117/88   Pulse: 87   Resp: 15   Temp:    SpO2: 100%     Pain Score: 6-moderate pain (06/29/23 1652)

## 2023-06-29 NOTE — EDIE (Signed)
[facility] NOTIFICATION 06/29/2023 16:50 Dexton, Athey DOB: [birth year] MRN: [id number]    [name]'s patient encounter information:   HQI:[id number]  Account [id number]  Billing Account [id number]      Criteria Met      5 ED Visits in 12 Months    Security and Safety  No Security Events were found.  ED Care Guidelines  There are currently no ED Care Guidelines for this patient. Please check your facility's medical records system.        Prescription Monitoring Program  Narx Score not available at this time.    E.D. Visit Count (12 mo.)  Facility Visits   [facility] 5   Total 5   Note: Visits indicate total known visits.     Recent Emergency Department Visit Summary  Date Facility [facility] Type Diagnoses or Chief Complaint    Jun 29, 2023  [place] [facility] [place]  [place]  Emergency      chest pain, sob      Jun 26, 2023  [place] - [place].  [place]  Emergency      Shortness of breath      Chronic kidney disease, unspecified      Disorder of kidney and ureter, unspecified      Other specified symptoms and signs involving the circulatory and respiratory systems      Insomnia, unspecified      Anemia, unspecified      Thrombocytopenia, unspecified      Chest Pain      Insomnia      NO SLEEP,BREATHING PROBLEMS      Feb 10, 2023  [place] [facility] [place]  [place]  Emergency      Gastrointestinal hemorrhage, unspecified      Abnormal Lab      [facility]      Dec 20, 2022  [place].  [place]  Emergency      Acute kidney failure, unspecified      Supraventricular tachycardia, unspecified      Chronic kidney disease, unspecified      Shortness of Breath      Chest Pain      Shortness of Breath; Emesis; Fatigue      Sep 04, 2022  [place] - [place].  [place]  Emergency      Other specified abnormal findings of blood chemistry      Acute on chronic right heart failure      Shortness of Breath      SOB        Recent Inpatient Visit Summary  Date Facility [facility] Type Diagnoses or Chief Complaint    Feb 10, 2023  [place] [facility] [place]  [place]  Medical Surgical      Acute on chronic systolic (congestive) heart failure      Anemia, unspecified      Chronic kidney disease, unspecified      Other specified abnormal findings of blood chemistry      Personal history of other diseases of the circulatory system      Personal history of leukemia      Long term (current) use of anticoagulants      Acute posthemorrhagic anemia      Gastrointestinal hemorrhage, unspecified      Dec 21, 2022  [place] [facility] [place]  [place]  [place]  Inpatient  Encounter for adjustment and management of vascular access device      Acute on chronic right heart failure      Ischemic cardiomyopathy      Chronic systolic (congestive) heart failure      Other cardiomyopathies      Heart failure, unspecified      Cardiogenic shock      Dec 20, 2022  [place] [facility] [place].  [place]  Medical Surgical      Supraventricular tachycardia, unspecified      Acute kidney failure, unspecified      Chronic kidney disease, unspecified      Sep 09, 2022  [place] [facility] [place]  [place]  [place]  Medical Surgical      Chronic combined systolic (congestive) and diastolic (congestive) heart failure      Ischemic cardiomyopathy      Sep 04, 2022  [place] [facility] [place]  [place]  Medical Surgical      Ischemic cardiomyopathy      Other specified abnormal findings of blood chemistry      Acute on chronic right heart failure        Care Team  No Care Team was found.  [facility]  This patient has registered at the [facility] Emergency Department  For more information visit: [email or web address]     PLEASE NOTE:     1.   Any care recommendations and other clinical information are provided as guidelines or for historical purposes only, and providers should exercise their own clinical judgment when providing care.    2.   You may only use this information for purposes  of treatment, payment or health care operations activities, and subject to the limitations of applicable [facility] Policies.    3.   You should consult directly with the organization that provided a care guideline or other clinical history with any questions about additional information or accuracy or completeness of information provided.    ? 2024 [facility] - [email or web address]

## 2023-06-29 NOTE — ED Provider Notes (Signed)
Chief Complaint   Patient presents with    Chest Pain    Shortness of Breath    Cough         History Provided by: patient     Additional History: [name] [name] is a 77 y.o. 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, presenting to the ED with the complaint of ongoing shortness of breath, cough and difficulty sleeping secondary to the shortness of breath and cough.  Patient states he was seen for the same this past Saturday and despite increasing his medications, symptoms have persisted prompting him to come back to the ER for assessment.  Patient did have some chest pain on the prior visit but denies that currently.  Admits to some chronic mild bilateral lower extremity edema but otherwise denies additional associated complaint including no fever, chills, coughing up blood, lightheadedness, passing out or nausea/vomiting.      PCP: Zorita Pang, MD    Specialists:           REVIEW OF SYMPTOMS     Review of Systems   Constitutional:  Negative for chills and fever.   Respiratory:  Positive for cough and shortness of breath.    Cardiovascular:  Positive for leg swelling. Negative for chest pain.   Gastrointestinal:   Negative for abdominal pain, nausea and vomiting.   Neurological:  Negative for loss of consciousness.   Psychiatric/Behavioral:  The patient has insomnia.              PAST HISTORY     Medical/Surgical History:   Medical History[1]  Past Surgical History:   Procedure Laterality Date    BIV  11/10/2016    ICD METRONIC  UPGRADE     CARDIAC CATHETERIZATION  03/2010    CORONARY STENT PLACEMENT X 2 PER PT; LM normal, LAD 70-80% distal lesion at apex, 20% lesion mid CFX    CARDIAC CATHETERIZATION  12/2012    CARDIAC DEFIBRILLATOR PLACEMENT  12/2012    [facility]    CARDIAC PACEMAKER PLACEMENT  2014    MEDTRONIC ICD/PACEMAKER PLACED [facility]    CIRCUMCISION  AGE 32    COLONOSCOPY, DIAGNOSTIC (SCREENING)  2009    CORRECTION HAMMER TOE      duodenal ulcer  1973    STRESS RELATED IN SCHOOL    ECHOCARDIOGRAM, TRANSESOPHAGEAL N/A 04/21/2023    Procedure: ECHOCARDIOGRAM, TRANSESOPHAGEAL;  Surgeon: George Hugh, MD;  Location: [facility]ART OR;  Service: Cardiology;  Laterality: N/A;  Probe #[id number]    ECHOCARDIOGRAM, TRANSTHORACIC  02/2016 11/2016 09/2017 12/2017    ECHOCARDIOGRAM, TRANSTHORACIC  02/2016,11/2016,12/20/2017    EGD N/A 08/15/2014    Procedure: EGD;  Surgeon: Colon Branch, MD;  Location: [facility];  Service: Gastroenterology;  Laterality: N/A;  egd w/ bx    EGD  1975    EGD  02/11/2023    Procedure: EGD and clip placement;  Surgeon: Doyne Keel, MD;  Location: [facility];  Service: Gastroenterology;;    EGD, COLONOSCOPY N/A 04/04/2018    Procedure: EGD with bxs, COLONOSCOPY with polypectomy and clipping;  Surgeon: Doyne Keel, MD;  Location: [facility];  Service: Gastroenterology;  Laterality: N/A;    FRACTURE SURGERY  AGE 89     RT  ANKLE- CAST APPLIED    HERNIA REPAIR  AGE 89    LEFT INGUINAL HERNIA    MPI nuclear study  06/2016    orthopedic surgery  AGE 65    RT foot corrective - HAMMERTOE    REPAIR, HAMMERTOE      RIGHT HEART CATH Right 09/09/2022    Procedure:  Right Heart Cath;  Surgeon: Burna Forts, MD;  Location: [facility];  Service: Cardiovascular;  Laterality: Right;  tx from FO    RIGHT HEART CATH ONLY INCL. CO AND SATS Right 12/22/2022    Procedure: Right Heart Cath ONLY incl. CO and Sats;  Surgeon: Day, Bertram Millard, MD;  Location: [facility];  Service: Cardiovascular;  Laterality: Right;  Right Heart Cath with leave in PA cath for milrinone initiation    TONSILLECTOMY AND ADENOIDECTOMY  1954    TUNNELED CATH PLACEMENT ([facility]) N/A 12/25/2022    Procedure: [facility];  Surgeon: Tamala Bari, MD;  Location: [facility];  Service: Interventional Radiology;  Laterality: N/A;    TUNNELED CATH REMOVAL ([facility]) Right 01/04/2023    Procedure: TUNNELED CATH REMOVAL;  Surgeon: Rex Kras, MD;  Location: [facility];  Service: Interventional Radiology;  Laterality: Right;       Social History:   Social History[2]     Family History:   Family History   Problem Relation Age of Onset    Breast cancer Mother     Heart attack Mother 68    Hypertension Mother     Diabetes Mother     Coronary artery disease Mother     Diabetes Brother     Heart attack Father     Hypertension Father        Medications:   The patient's home medications have been reviewed.    Allergies:   Allopurinol             PHYSICAL EXAM     Temp: 97.9 F (36.6 C)  Heart Rate: 91  Resp Rate: (!) 25  BP: (!) 137/94  SpO2: 93 %  Pain Score: 6-moderate pain    Physical Exam  Vitals and nursing note reviewed.   Constitutional:       General: He is not in acute distress.     Appearance: Normal appearance. He is not ill-appearing, toxic-appearing or diaphoretic.   HENT:      Head: Normocephalic and atraumatic.      Mouth/Throat:      Mouth: Mucous membranes are moist.      Pharynx: Oropharynx is clear.   Eyes:      Conjunctiva/sclera: Conjunctivae normal.   Neck:      Comments: Trachea midline  Cardiovascular:      Rate and Rhythm: Normal rate.      Pulses: Normal pulses.      Heart sounds:  Murmur heard.      No friction rub. No gallop.   Pulmonary:      Effort: Pulmonary effort is normal. No respiratory distress.      Breath sounds: No stridor. No wheezing, rhonchi or rales.      Comments: Somewhat diminished bibasilar but no clear adventitious breath  sounds.  Able to speak in full sentences.  No respiratory distress.  Abdominal:      General: Abdomen is flat. There is no distension.      Tenderness: There is no abdominal tenderness.   Musculoskeletal:      Right lower leg: Edema present.      Left lower leg: Edema present.   Skin:     General: Skin is warm and dry.      Capillary Refill: Capillary refill takes less than 2 seconds.   Neurological:      General: No focal deficit present.      Mental Status: He is alert and oriented to person, place, and time.                 MEDICAL DECISION MAKING   Laboratory Results:  Results       Procedure Component Value Units Date/Time    Whole Blood Glucose POCT [[id number]]  (Abnormal) Collected: 06/29/23 2312    Specimen: Blood, Capillary Updated: 06/29/23 2318     Whole Blood Glucose POCT 248 mg/dL     APTT [[id number]]  (Normal) Collected: 06/29/23 1914    Specimen: Blood, Venous Updated: 06/29/23 2044     PTT 27 sec     High Sensitivity Troponin-I at 2 hrs with calculated Delta [[id number]]  (Abnormal) Collected: 06/29/23 1914    Specimen: Blood, Venous Updated: 06/29/23 2019     hs Troponin 67.9 ng/L      hs Troponin-I Delta 10    D-Dimer [[id number]]  (Abnormal) Collected: 06/29/23 1914    Specimen: Blood, Venous Updated: 06/29/23 1935     D-Dimer 0.94 ug/mL FEU     COVID-19 and Influenza (Liat) (symptomatic) [[id number]]  (Normal) Collected: 06/29/23 1805    Specimen: Swab from Anterior Nares Updated: 06/29/23 1837     SARS-CoV-2 (COVID-19) RNA Not Detected     Influenza A RNA Not Detected     Influenza B RNA Not Detected    Narrative:      A result of "Detected" indicates POSITIVE for the presence of viral RNA  A result of "Not Detected" indicates NEGATIVE  for the presence of viral RNA    Test performed using the Roche cobas Liat SARS-CoV-2 & Influenza A/B assay. This is a multiplex real-time RT-PCR assay for the detection of SARS-CoV-2, influenza A, and influenza B virus RNA. Viral nucleic acids may persist in vivo, independent of viability. Detection of viral nucleic acid does not imply the presence of infectious virus, or that virus nucleic acid is the cause of clinical symptoms. Negative results do not preclude SARS-CoV-2, influenza A, and/or influenza B infection and should not be used as the sole basis for diagnosis, treatment or other patient management decisions. Invalid results may be due to inhibiting substances in the specimen and recollection should occur.     NT-ProBNP [[id number]]  (Abnormal) Collected: 06/29/23 1703    Specimen: Blood, Venous Updated: 06/29/23 1829     NT-ProBNP 14,396 pg/mL     CBC with Differential (Order) [[id number]]  (Abnormal) Collected: 06/29/23 1703    Specimen: Blood, Venous Updated: 06/29/23 1808    Narrative:      The following orders were created for panel order CBC with Differential (Order).  Procedure                               Abnormality         Status                     ---------                               -----------         ------  CBC with Differential (C.Marland KitchenMarland Kitchen[[id number]]  Abnormal            Final result               Manual Differential (Com.Marland KitchenMarland Kitchen[[id number]]  Abnormal            Final result               CBC Pathologist Review[[id number]]                           In process                   Please view results for these tests on the individual orders.    CBC with Differential (Component) [[id number]]  (Abnormal) Collected: 06/29/23 1703    Specimen: Blood, Venous Updated: 06/29/23 1808     WBC 10.44 x10 3/uL      Hemoglobin 11.4 g/dL      Hematocrit 27.2 %      Platelet Count 94 x10 3/uL      MPV --     RBC 4.09 x10 6/uL      MCV 90.2 fL      MCH 27.9 pg      MCHC 30.9 g/dL      RDW 19 %       nRBC % 1.6 /100 WBC      Absolute nRBC 0.17 x10 3/uL      Preliminary Absolute Neutrophil Count 3.18 x10 3/uL     Manual Differential (Component) [[id number]]  (Abnormal) Collected: 06/29/23 1703    Specimen: Blood, Venous Updated: 06/29/23 1808     Neutrophils % 34 %      Lymphocytes % 63 %      Monocytes % 3 %      Eosinophils % 0 %      Basophils % 0 %      Bands % 0 %      Absolute Neutrophils 3.55 x10 3/uL      Absolute Lymphocytes 6.58 x10 3/uL      Absolute Monocytes 0.31 x10 3/uL      Absolute Eosinophils 0.00 x10 3/uL      Absolute Basophils 0.00 x10 3/uL      Absolute Bands 0.00 x10 3/uL      RBC Morphology Abnormal     Ovalocytes Present     Polychromasia Present     Dimorphic RBC Population Present     Platelet Estimate Decreased    CBC Pathologist Review [[id number]] Collected: 06/29/23 1703    Specimen: Blood, Venous Updated: 06/29/23 1807    Whole Blood Glucose POCT [[id number]]  (Abnormal) Collected: 06/29/23 1742    Specimen: Blood, Capillary Updated: 06/29/23 1744     Whole Blood Glucose POCT 210 mg/dL     High Sensitivity Troponin-I at 0 hrs [[id number]]  (Abnormal) Collected: 06/29/23 1703    Specimen: Blood, Venous Updated: 06/29/23 1736     hs Troponin 58.2 ng/L     Comprehensive Metabolic Panel [[id number]]  (Abnormal) Collected: 06/29/23 1703    Specimen: Blood, Venous Updated: 06/29/23 1734     Glucose 248 mg/dL      BUN 59 mg/dL      Creatinine 3.8 mg/dL      Sodium 235 mEq/L      Potassium 5.3 mEq/L      Chloride 107 mEq/L      CO2 17 mEq/L      Calcium 9.6 mg/dL  Anion Gap 13.0     GFR 15.7 mL/min/1.73 m2      AST (SGOT) 62 U/L      ALT 53 U/L      Alkaline Phosphatase 159 U/L      Albumin 3.6 g/dL      Protein, Total 7.2 g/dL      Globulin 3.6 g/dL      Albumin/Globulin Ratio 1.0     Bilirubin, Total 1.7 mg/dL              Radiology Results:  Chest AP Portable    Result Date: 06/29/2023   No acute findings. Wynema Birch, MD 06/29/2023 5:31 PM     Pulse Oximetry Interpretation: 93 % on  RA      EKG Interpretation:  Signed and interpreted by the EP.    Time Interpreted:    Rate: 89   Interpretation: Atrial sensed, V paced rhythm.  No STEMI.  Abnormal EKG.  EKG interpreted by me.       Nursing Notes: Reviewed available nursing notes.     Medical Records personally reviewed and found to have relevant information from: Old medical records.  Previous electrocardiograms.  Nursing notes.  Previous radiology studies.      MDM: 77 y.o. male presents with ongoing shortness of breath, cough and difficulty sleeping secondary to the difficulty breathing.  Patient was seen for same (except chest pain has since resolved) on Saturday.  However despite increasing his diuretic, patient's have persisted prompting him to return to the ER.  No new complaints.  On examination patient well-appearing, nontoxic in no acute distress.  His blood pressure is elevated.  Respiratory rate elevated but patient is able to speak in full sentences and is not in respiratory distress.  Remainder vitals within normal limits.  His lungs are clear to auscultation bilateral but he is somewhat diminished bibasilar.  Some mild bilateral lower extreme edema.  Remainder examination as document above and is unremarkable.  Patient has severe CHF with EF less than 20% on most recent echo.  Patient also has severe mitral regurgitation and states he is pending a surgery for valve repair.  Given his complaints and past medical history, workup initiated to further assess.  Will add D-dimer to workup.  Patient not on blood thinner.  His physical examination although somewhat suggestive of heart failure exacerbation is not clear/definitive.  Will assess for possible alternative etiology.  Patient's creatinine will not allow for CTA.  If D-dimer elevated, will need to proceed with VQ scan.  Chest x-ray does not show pulm edema and given this coupled with lung exam, will defer emergent dose of diuretic here in the ED pending further recommended  inpatient team including cardiology.  Continue to monitor as workup proceeds.    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      Number and Complexity of Problems Addressed (select at least one)    Complexity: High: 1 acute or chronic illness or injury that poses a threat to life or bodily function        ED Course:      ED Course as of 06/29/23 2326   Tue Jun 29, 2023   1750 hs Troponin-I(!): 58.2  69.4, 3 days ago. Working with nursing to get pt roomed. ED experiencing high volumes [ST]   1830 NT-proBNP(!): 14,396 [ST]   1903 Tech to send d-dimer and repeat trop [ST]   1935 D-Dimer(!): 0.94  Will need V/Q. Cardiology paged for consult [ST]   1948 Dr.  Satira Sark   2026 hs Troponin-I(!!): 67.9  No CP. Will give ASA and discuss/update hospitalist [ST]   2029 After discussion with hospitalist, Dr. Mercie Eon, given increasing troponin and  patient pending VQ scan, will initiate anticoagulation (heparin drip)  [ST]   2042 Further chart review reveals history of duodenal bleed with anticoagulation held in April.  Hospitalist advises that they will look further into chart but we will hold anticoagulation for now   [ST]      ED Course User Index  [ST] Garey Ham, DO     Critical Care Time:     The services I provided to this patient were to treat and/or prevent clinically significant deterioration that could result in the failure of one or more body systems and/or organs.    Services included the following: chart data review, reviewing nursing notes and/or old charts, documentation time, consultant collaboration regarding findings and treatment options, medication orders and management, direct patient care, re-evaluations, vital sign assessments and ordering, interpreting and reviewing diagnostic studies/lab tests.    Aggregate critical care time was 35 minutes, which includes only time during which I was engaged in work directly related to the patient's care, as described above, whether at the bedside or elsewhere in the Emergency Department.  It did not include time spent performing other reported procedures or the services of residents, students, nurses or physician assistants    ______________________________________________________________________  Amount/complexity of Data Reviewed   {Tip - Level 4 = (1/3 of the following categories); Level 5 = (2/3 categories).   This message will disappear upon signing. :  L\  EKG Interpretation as above, if indicated    Independent visualized and interpretation of radiological study by me:     Type of xray performed : CXR  Independent Interpretation by me: no consolidation    Diagnostic tests appropriately considered even if not ultimately performed: labs, XR, CT, CTA, V/Q, echo    Discussion of management with other providers:   Consulted with  Dr. Ev[facility]. Patient condition and all pertinent labs  and/or radiology studies discussed with physician.     ______________________________________________________________________    Risk of Complications and/or Morbidity or Mortality of Patient Management  (select one if applicable)    Risk: High Risk, Drug therapy requiring intensive monitoring for toxicity  , and Decision regarding hospitalization or escalation of hospital level of care      IMPRESSION AND DISPOSITION     Clinical Impression   1. Acute on chronic congestive heart failure, unspecified heart failure type    2. Chronic kidney disease, unspecified CKD stage    3. Thrombocytopenia    4. Elevated blood pressure reading          Chart Ownership:  Paul Half, DO, am the primary clinician of record.       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    Coronary artery disease 2011    CORONARY STENT PLACEMENT FOLLOWED BY [facility]    echocardiogram 02/2016, 11/2016, 09/2017, 12/20/2017    GERD (gastroesophageal reflux disease)     Heart attack 2011    and 2014    Hyperlipemia     Hypertension     116/80    ICD (implantable 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     PLACED 2014  [facility]  LAST INTERROGATION April 01 2018 REPORT REQUESTED    Ischemic cardiomyopathy     EF 15% ON ECHO 09-20-2017 DR GARG IN EPIC    Nonischemic cardiomyopathy     NSTEMI (non-ST elevated myocardial infarction) 2012    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    Pacemaker 2014    MEDTRONIC ICD/PACEMAKER COMBO LAST INTERROGATION  12-12 2018    Pneumonia 09/2016    Primary cardiomyopathy     Ischemic cardiomyopathy EF 15% ON ECHO 09-20-2017 DR GARG IN EPIC    Renal insufficiency     Sepsis 08/2017    Syncope and  collapse     PRIOR TO ICD/PACEMAKER PLACED 2014    Type 2 diabetes mellitus, controlled DX 1998    A1c ~6.0 (02/2023) FBS~110    Wheeze     PT SEE HIS PMD April 01 2018 RE THIS-TOLD TO SEE PMD BY [facility] JUNE 12 WHEN THEY SAW HIM FOR A CL   [2]   Social History  Tobacco Use    Smoking status: Never    Smokeless tobacco: Never   Vaping Use    Vaping status: Never Used   Substance Use Topics    Alcohol use: Not Currently    Drug use: No        Garey Ham, DO  06/29/23 2326

## 2023-06-30 ENCOUNTER — Inpatient Hospital Stay: Payer: Commercial Managed Care - POS

## 2023-06-30 ENCOUNTER — Observation Stay: Payer: Commercial Managed Care - POS

## 2023-06-30 ENCOUNTER — Encounter
Admission: EM | Disposition: A | Discharge status: Hospice | Payer: Self-pay | Source: Home / Self Care | Attending: Internal Medicine

## 2023-06-30 ENCOUNTER — Other Ambulatory Visit: Payer: Self-pay

## 2023-06-30 ENCOUNTER — Observation Stay (HOSPITAL_COMMUNITY): Payer: Commercial Managed Care - POS

## 2023-06-30 DIAGNOSIS — I10 Essential (primary) hypertension: Secondary | ICD-10-CM

## 2023-06-30 DIAGNOSIS — N189 Chronic kidney disease, unspecified: Secondary | ICD-10-CM

## 2023-06-30 DIAGNOSIS — I509 Heart failure, unspecified: Secondary | ICD-10-CM

## 2023-06-30 DIAGNOSIS — I25810 Atherosclerosis of coronary artery bypass graft(s) without angina pectoris: Secondary | ICD-10-CM

## 2023-06-30 DIAGNOSIS — R570 Cardiogenic shock: Secondary | ICD-10-CM

## 2023-06-30 DIAGNOSIS — R0600 Dyspnea, unspecified: Secondary | ICD-10-CM

## 2023-06-30 DIAGNOSIS — C9110 Chronic lymphocytic leukemia of B-cell type not having achieved remission: Secondary | ICD-10-CM

## 2023-06-30 DIAGNOSIS — I340 Nonrheumatic mitral (valve) insufficiency: Secondary | ICD-10-CM

## 2023-06-30 HISTORY — PX: RIGHT HEART CATH: CATH22

## 2023-06-30 LAB — ECHO ADULT TTE LIMITED
Ao Root Diameter (2D): 3.7
BP Mod LV Ejection Fraction: 13
IVS Diastolic Thickness (2D): 1
LA Dimension (2D): 5.1
LVID diastole (2D): 6.8
LVID systole (2D): 6.4

## 2023-06-30 LAB — MAGNESIUM
Magnesium: 2.4 mg/dL (ref 1.6–2.6)
Magnesium: 2.4 mg/dL (ref 1.6–2.6)
Magnesium: 2.3 mg/dL (ref 1.6–2.6)
Magnesium: 2.7 mg/dL — ABNORMAL HIGH (ref 1.6–2.6)

## 2023-06-30 LAB — LAB USE ONLY - MANUAL DIFFERENTIAL
Absolute Bands: 0.11 10*3/uL (ref 0.00–1.00)
Absolute Basophils: 0 10*3/uL (ref 0.00–0.08)
Absolute Eosinophils: 0.11 10*3/uL (ref 0.00–0.44)
Absolute Lymphocytes: 5.81 10*3/uL — ABNORMAL HIGH (ref 0.42–3.22)
Absolute Monocytes: 0.11 10*3/uL — ABNORMAL LOW (ref 0.21–0.85)
Absolute Neutrophils: 4.83 10*3/uL (ref 1.10–6.33)
Bands %: 1 %
Basophils %: 0 %
Eosinophils %: 1 %
Lymphocytes %: 53 %
Monocytes %: 1 %
Neutrophils %: 44 %
Platelet Estimate: DECREASED — AB
RBC Morphology: ABNORMAL — AB

## 2023-06-30 LAB — BASIC METABOLIC PANEL
Anion Gap: 14 (ref 5.0–15.0)
Anion Gap: 12 (ref 5.0–15.0)
Anion Gap: 13 (ref 5.0–15.0)
BUN: 62 mg/dL — ABNORMAL HIGH (ref 9–28)
BUN: 62 mg/dL — ABNORMAL HIGH (ref 9–28)
BUN: 59 mg/dL — ABNORMAL HIGH (ref 9–28)
CO2: 17 meq/L (ref 17–29)
CO2: 19 meq/L (ref 17–29)
CO2: 22 meq/L (ref 17–29)
Calcium: 9.3 mg/dL (ref 7.9–10.2)
Calcium: 9.1 mg/dL (ref 7.9–10.2)
Calcium: 9.2 mg/dL (ref 7.9–10.2)
Chloride: 104 meq/L (ref 99–111)
Chloride: 107 meq/L (ref 99–111)
Chloride: 106 meq/L (ref 99–111)
Creatinine: 4.1 mg/dL — ABNORMAL HIGH (ref 0.5–1.5)
Creatinine: 3.7 mg/dL — ABNORMAL HIGH (ref 0.5–1.5)
Creatinine: 3.5 mg/dL — ABNORMAL HIGH (ref 0.5–1.5)
GFR: 14.3 mL/min/{1.73_m2} — ABNORMAL LOW (ref >=60.0)
GFR: 16.2 mL/min/{1.73_m2} — ABNORMAL LOW (ref >=60.0)
GFR: 17.3 mL/min/{1.73_m2} — ABNORMAL LOW (ref >=60.0)
Glucose: 308 mg/dL — ABNORMAL HIGH (ref 70–100)
Glucose: 244 mg/dL — ABNORMAL HIGH (ref 70–100)
Glucose: 210 mg/dL — ABNORMAL HIGH (ref 70–100)
Potassium: 4.2 meq/L (ref 3.5–5.3)
Potassium: 3.8 meq/L (ref 3.5–5.3)
Potassium: 3.5 meq/L (ref 3.5–5.3)
Sodium: 135 meq/L (ref 135–145)
Sodium: 138 meq/L (ref 135–145)
Sodium: 141 meq/L (ref 135–145)

## 2023-06-30 LAB — LAB USE ONLY - CBC WITH DIFFERENTIAL
Absolute nRBC: 0.24 10*3/uL — ABNORMAL HIGH (ref <=0.00)
Hematocrit: 37.1 % — ABNORMAL LOW (ref 37.6–49.6)
Hemoglobin: 11.4 g/dL — ABNORMAL LOW (ref 12.5–17.1)
MCH: 27.7 pg (ref 25.1–33.5)
MCHC: 30.7 g/dL — ABNORMAL LOW (ref 31.5–35.8)
MCV: 90.3 fL (ref 78.0–96.0)
Platelet Count: 108 10*3/uL — ABNORMAL LOW (ref 142–346)
Preliminary Absolute Neutrophil Count: 3.34 10*3/uL (ref 1.10–6.33)
RBC: 4.11 10*6/uL — ABNORMAL LOW (ref 4.20–5.90)
RDW: 19 % — ABNORMAL HIGH (ref 11–15)
WBC: 10.97 10*3/uL — ABNORMAL HIGH (ref 3.10–9.50)
nRBC %: 2.2 /100{WBCs} — ABNORMAL HIGH (ref <=0.0)

## 2023-06-30 LAB — ECG 12-LEAD
Atrial Rate: 89 {beats}/min
P Axis: 71 degrees
P-R Interval: 160 ms
Q-T Interval: 410 ms
QRS Duration: 100 ms
QTC Calculation (Bezet): 498 ms
R Axis: -26 degrees
T Axis: 92 degrees
Ventricular Rate: 89 {beats}/min

## 2023-06-30 LAB — HEMOGLOBIN AND HEMATOCRIT
Hematocrit: 33.7 % — ABNORMAL LOW (ref 37.6–49.6)
Hemoglobin: 10.6 g/dL — ABNORMAL LOW (ref 12.5–17.1)

## 2023-06-30 LAB — LACTIC ACID
Whole Blood Lactic Acid: 1.1 mmol/L (ref 0.2–2.0)
Whole Blood Lactic Acid: 1.1 mmol/L (ref 0.2–2.0)
Whole Blood Lactic Acid: 1.5 mmol/L (ref 0.2–2.0)
Whole Blood Lactic Acid: 3.7 mmol/L — ABNORMAL HIGH (ref 0.2–2.0)

## 2023-06-30 LAB — WHOLE BLOOD GLUCOSE POCT
Whole Blood Glucose POCT: 251 mg/dL — ABNORMAL HIGH (ref 70–100)
Whole Blood Glucose POCT: 225 mg/dL — ABNORMAL HIGH (ref 70–100)
Whole Blood Glucose POCT: 205 mg/dL — ABNORMAL HIGH (ref 70–100)
Whole Blood Glucose POCT: 195 mg/dL — ABNORMAL HIGH (ref 70–100)

## 2023-06-30 LAB — CALCIUM, IONIZED: Calcium, Ionized: 2.29 meq/L (ref 2.23–2.56)

## 2023-06-30 SURGERY — RIGHT HEART CATH
Anesthesia: IV Sedation | Site: Chest | Laterality: Right

## 2023-06-30 MED ORDER — NITROPRUSSIDE SODIUM 25 MG/ML IV SOLN
0.0000 ug/kg/min | INTRAVENOUS | Status: DC
Start: 2023-06-30 — End: 2023-07-04
  Administered 2023-06-30: 0.1 ug/kg/min via INTRAVENOUS
  Administered 2023-07-01 – 2023-07-02 (×3): 1 ug/kg/min via INTRAVENOUS
  Administered 2023-07-02: 0.75 ug/kg/min via INTRAVENOUS
  Filled 2023-06-30 (×4): qty 2

## 2023-06-30 MED ORDER — ATORVASTATIN CALCIUM 20 MG PO TABS
80.0000 mg | ORAL_TABLET | Freq: Every day | ORAL | Status: DC
Start: 2023-06-30 — End: 2023-07-07
  Administered 2023-06-30 – 2023-07-07 (×8): 80 mg via ORAL
  Filled 2023-06-30 (×8): qty 4

## 2023-06-30 MED ORDER — HYDRALAZINE HCL 25 MG PO TABS
50.0000 mg | ORAL_TABLET | Freq: Three times a day (TID) | ORAL | Status: DC
Start: 2023-06-30 — End: 2023-06-30
  Administered 2023-06-30 (×2): 50 mg via ORAL
  Filled 2023-06-30 (×2): qty 1

## 2023-06-30 MED ORDER — POTASSIUM CHLORIDE CRYS ER 10 MEQ PO TBCR
10.0000 meq | EXTENDED_RELEASE_TABLET | Freq: Once | ORAL | Status: AC
Start: 2023-06-30 — End: 2023-06-30
  Administered 2023-06-30: 10 meq via ORAL
  Filled 2023-06-30: qty 1

## 2023-06-30 MED ORDER — DOBUTAMINE-DEXTROSE 2-5 MG/ML-% IV SOLN
2.5000 ug/kg/min | INTRAVENOUS | Status: DC
Start: 2023-06-30 — End: 2023-06-30
  Administered 2023-06-30: 2.5 ug/kg/min via INTRAVENOUS
  Filled 2023-06-30: qty 250

## 2023-06-30 MED ORDER — AMIODARONE HCL IN DEXTROSE 360-4.14 MG/200ML-% IV SOLN
1.0000 mg/min | INTRAVENOUS | Status: AC
Start: 2023-06-30 — End: 2023-07-01
  Administered 2023-06-30 – 2023-07-01 (×2): 1 mg/min via INTRAVENOUS
  Filled 2023-06-30 (×2): qty 200

## 2023-06-30 MED ORDER — EMPAGLIFLOZIN 10 MG PO TABS
10.0000 mg | ORAL_TABLET | Freq: Every day | ORAL | Status: DC
Start: 2023-06-30 — End: 2023-06-30

## 2023-06-30 MED ORDER — GLUCOSE 40 % PO GEL (WRAP)
15.0000 g | ORAL | Status: DC | PRN
Start: 2023-06-30 — End: 2023-07-02

## 2023-06-30 MED ORDER — POTASSIUM CHLORIDE CRYS ER 20 MEQ PO TBCR
20.0000 meq | EXTENDED_RELEASE_TABLET | Freq: Once | ORAL | Status: AC
Start: 2023-06-30 — End: 2023-06-30
  Administered 2023-06-30: 20 meq via ORAL
  Filled 2023-06-30: qty 1

## 2023-06-30 MED ORDER — GLUCAGON 1 MG IJ SOLR (WRAP)
1.0000 mg | INTRAMUSCULAR | Status: DC | PRN
Start: 2023-06-30 — End: 2023-07-02

## 2023-06-30 MED ORDER — BUMETANIDE INFUSION 0.25 MG/ML
0.2500 mg/h | INTRAVENOUS | Status: DC
Start: 2023-06-30 — End: 2023-07-05
  Administered 2023-06-30 – 2023-07-04 (×5): 1 mg/h via INTRAVENOUS
  Filled 2023-06-30 (×6): qty 100

## 2023-06-30 MED ORDER — MAGNESIUM SULFATE IN D5W 1-5 GM/100ML-% IV SOLN
1.0000 g | Freq: Once | INTRAVENOUS | Status: AC
Start: 2023-06-30 — End: 2023-06-30
  Administered 2023-06-30: 1 g via INTRAVENOUS
  Filled 2023-06-30: qty 100

## 2023-06-30 MED ORDER — INSULIN GLARGINE 100 UNIT/ML SC SOLN
20.0000 [IU] | Freq: Every evening | SUBCUTANEOUS | Status: DC
Start: 2023-06-30 — End: 2023-07-01
  Administered 2023-06-30: 20 [IU] via SUBCUTANEOUS
  Filled 2023-06-30: qty 1
  Filled 2023-06-30: qty 20

## 2023-06-30 MED ORDER — ISOSORBIDE DINITRATE 20 MG PO TABS
40.0000 mg | ORAL_TABLET | Freq: Two times a day (BID) | ORAL | Status: DC
Start: 2023-06-30 — End: 2023-06-30
  Administered 2023-06-30: 40 mg via ORAL
  Filled 2023-06-30 (×4): qty 2

## 2023-06-30 MED ORDER — INSULIN GLARGINE 100 UNIT/ML SC SOLN
33.0000 [IU] | Freq: Every evening | SUBCUTANEOUS | Status: DC
Start: 2023-06-30 — End: 2023-06-30

## 2023-06-30 MED ORDER — INSULIN GLARGINE 100 UNIT/ML SC SOLN
15.0000 [IU] | Freq: Every evening | SUBCUTANEOUS | Status: DC
Start: 2023-06-30 — End: 2023-06-30

## 2023-06-30 MED ORDER — SULFUR HEXAFLUORIDE MICROSPH 60.7-25 MG IJ SUSR
5.0000 mL | Freq: Once | INTRAMUSCULAR | Status: AC
Start: 2023-06-30 — End: 2023-06-30
  Administered 2023-06-30: 5 mL via INTRAVENOUS

## 2023-06-30 MED ORDER — AMIODARONE HCL IN DEXTROSE 360-4.14 MG/200ML-% IV SOLN
0.5000 mg/min | INTRAVENOUS | Status: DC
Start: 2023-07-01 — End: 2023-06-30

## 2023-06-30 MED ORDER — INSULIN LISPRO 100 UNIT/ML SOLN (WRAP)
1.0000 [IU] | Freq: Three times a day (TID) | Status: DC
Start: 2023-06-30 — End: 2023-07-01
  Administered 2023-06-30: 3 [IU] via SUBCUTANEOUS
  Administered 2023-06-30 (×2): 2 [IU] via SUBCUTANEOUS
  Administered 2023-07-01: 4 [IU] via SUBCUTANEOUS
  Filled 2023-06-30: qty 6
  Filled 2023-06-30: qty 9
  Filled 2023-06-30: qty 12
  Filled 2023-06-30: qty 6

## 2023-06-30 MED ORDER — PANTOPRAZOLE SODIUM 40 MG PO TBEC
40.0000 mg | DELAYED_RELEASE_TABLET | Freq: Every morning | ORAL | Status: DC
Start: 2023-06-30 — End: 2023-07-07
  Administered 2023-06-30 – 2023-07-07 (×8): 40 mg via ORAL
  Filled 2023-06-30 (×8): qty 1

## 2023-06-30 MED ORDER — TRAZODONE HCL 50 MG PO TABS
50.0000 mg | ORAL_TABLET | Freq: Every evening | ORAL | Status: DC | PRN
Start: 2023-06-30 — End: 2023-07-07
  Administered 2023-06-30 – 2023-07-06 (×6): 50 mg via ORAL
  Filled 2023-06-30 (×6): qty 1

## 2023-06-30 MED ORDER — AMIODARONE HCL IN DEXTROSE 360-4.14 MG/200ML-% IV SOLN
0.5000 mg/min | INTRAVENOUS | Status: DC
Start: 2023-07-01 — End: 2023-07-05
  Administered 2023-07-01: 1 mg/min via INTRAVENOUS
  Administered 2023-07-01: 0.5 mg/min via INTRAVENOUS
  Administered 2023-07-02 (×3): 1 mg/min via INTRAVENOUS
  Administered 2023-07-03 – 2023-07-04 (×5): 0.5 mg/min via INTRAVENOUS
  Filled 2023-06-30 (×10): qty 200

## 2023-06-30 MED ORDER — AMIODARONE HCL IN DEXTROSE 150-4.21 MG/100ML-% IV SOLN
150.0000 mg | Freq: Once | INTRAVENOUS | Status: AC
Start: 2023-06-30 — End: 2023-06-30
  Administered 2023-06-30: 150 mg via INTRAVENOUS
  Filled 2023-06-30: qty 100

## 2023-06-30 MED ORDER — DEXTROSE 50 % IV SOLN
12.5000 g | INTRAVENOUS | Status: DC | PRN
Start: 2023-06-30 — End: 2023-07-02

## 2023-06-30 MED ORDER — ASPIRIN 81 MG PO TBEC
81.0000 mg | DELAYED_RELEASE_TABLET | Freq: Every day | ORAL | Status: DC
Start: 2023-06-30 — End: 2023-07-01
  Administered 2023-06-30: 81 mg via ORAL
  Filled 2023-06-30 (×2): qty 1

## 2023-06-30 MED ORDER — NIACIN ER (ANTIHYPERLIPIDEMIC) 500 MG PO TBCR
500.0000 mg | EXTENDED_RELEASE_TABLET | Freq: Two times a day (BID) | ORAL | Status: DC
Start: 2023-06-30 — End: 2023-06-30
  Filled 2023-06-30 (×4): qty 1

## 2023-06-30 MED ORDER — AMIODARONE HCL IN DEXTROSE 360-4.14 MG/200ML-% IV SOLN
1.0000 mg/min | INTRAVENOUS | Status: DC
Start: 2023-06-30 — End: 2023-06-30

## 2023-06-30 MED ORDER — DEXTROSE 10 % IV BOLUS
12.5000 g | INTRAVENOUS | Status: DC | PRN
Start: 2023-06-30 — End: 2023-07-02

## 2023-06-30 MED ORDER — INSULIN LISPRO 100 UNIT/ML SOLN (WRAP)
1.0000 [IU] | Freq: Every evening | Status: DC
Start: 2023-06-30 — End: 2023-07-01
  Administered 2023-06-30: 1 [IU] via SUBCUTANEOUS
  Filled 2023-06-30: qty 3

## 2023-06-30 MED ORDER — HYDRALAZINE HCL 25 MG PO TABS
25.0000 mg | ORAL_TABLET | Freq: Three times a day (TID) | ORAL | Status: DC
Start: 2023-06-30 — End: 2023-06-30
  Administered 2023-06-30: 25 mg via ORAL
  Filled 2023-06-30: qty 1

## 2023-06-30 MED ORDER — BUMETANIDE 0.25 MG/ML IJ SOLN
4.0000 mg | Freq: Once | INTRAMUSCULAR | Status: AC
Start: 2023-06-30 — End: 2023-06-30
  Administered 2023-06-30: 4 mg via INTRAVENOUS
  Filled 2023-06-30: qty 20

## 2023-06-30 MED ORDER — SPIRONOLACTONE 25 MG PO TABS
12.5000 mg | ORAL_TABLET | Freq: Every day | ORAL | Status: DC
Start: 2023-06-30 — End: 2023-06-30

## 2023-06-30 MED ORDER — METOPROLOL SUCCINATE ER 25 MG PO TB24
25.0000 mg | ORAL_TABLET | Freq: Two times a day (BID) | ORAL | Status: DC
Start: 2023-06-30 — End: 2023-06-30
  Administered 2023-06-30: 25 mg via ORAL
  Filled 2023-06-30: qty 1

## 2023-06-30 MED ORDER — BENZONATATE 100 MG PO CAPS
200.0000 mg | ORAL_CAPSULE | Freq: Three times a day (TID) | ORAL | Status: DC | PRN
Start: 2023-06-30 — End: 2023-07-07
  Administered 2023-06-30 – 2023-07-06 (×3): 200 mg via ORAL
  Filled 2023-06-30 (×3): qty 2

## 2023-06-30 MED ORDER — HEPARIN (PORCINE) IN NACL 2-0.9 UNIT/ML-% IJ SOLN (WRAP)
INTRAVENOUS | Status: AC
Start: 2023-06-30 — End: ?
  Filled 2023-06-30: qty 500

## 2023-06-30 MED ORDER — GUAIFENESIN-DM 100-10 MG/5ML PO SYRP
10.0000 mL | ORAL_SOLUTION | ORAL | Status: DC | PRN
Start: 2023-06-30 — End: 2023-07-07
  Administered 2023-06-30 – 2023-07-06 (×3): 10 mL via ORAL
  Filled 2023-06-30 (×3): qty 10

## 2023-06-30 MED ORDER — DOBUTAMINE-DEXTROSE 2-5 MG/ML-% IV SOLN
2.5000 ug/kg/min | INTRAVENOUS | Status: DC
Start: 2023-06-30 — End: 2023-07-05
  Administered 2023-06-30: 2 ug/kg/min via INTRAVENOUS
  Administered 2023-07-01 – 2023-07-03 (×4): 5 ug/kg/min via INTRAVENOUS
  Administered 2023-07-04: 2.5 ug/kg/min via INTRAVENOUS
  Filled 2023-06-30 (×5): qty 250

## 2023-06-30 SURGICAL SUPPLY — 8 items
CATHETER PA .032IN STD SG VIP 7.5FR 110 (Catheter) ×1 IMPLANT
CATHETER PULMONARY ARTERY L110 CM STANDARD 5 LUMEN THERMODILUTION (Catheter) IMPLANT
INTRODUCER SHEATH OD8.5 FR L12 CM (Sheaths) ×1 IMPLANT
INTRODUCER SHEATH OD8.5 FR L12 CM DILATOR LUER LOCK HUB REPOSITION (Sheaths) IMPLANT
KIT INTRODUCER L10 CM .018 IN STIFFENED (Introducer) ×1 IMPLANT
KIT INTRODUCER L10 CM .018 IN STIFFENED DILATOR NITINOL GUIDEWIRE (Introducer) IMPLANT
SHEATH INTRODUCER L10 CM L2.5 CM ID6 FR SNAP ON DILATOR LOCK KINK (Sheaths) IMPLANT
SHEATH INTRODUCER L10 CM SNAP ON DILATOR (Sheaths) ×1 IMPLANT

## 2023-06-30 NOTE — Transition of Care (Signed)
4 eyes in 4 hours pressure injury assessment note:      Completed with: Simonne Come RN  Unit & Time admitted: 29M/2300             Bony Prominences: Check appropriate box; if wound is present enter wound assessment in LDA     Occiput:                 [x] WNL  []  Wound present  Face:                     [x] WNL  []  Wound present  Ears:                      [x] WNL  []  Wound present  Spine:                    [x] WNL  []  Wound present  Shoulders:             [x] WNL  []  Wound present  Elbows:                  [x] WNL  []  Wound present  Sacrum/coccyx:     [x] WNL  []  Wound present  Ischial Tuberosity:  [x] WNL  []  Wound present  Trochanter/Hip:      [x] WNL  []  Wound present  Knees:                   [x] WNL  []  Wound present  Ankles:                   [x] WNL  []  Wound present  Heels:                    [x] WNL  []  Wound present  Other pressure areas:  []  Wound location       Device related: []  Device name:         LDA completed if wound present: yes/no  Consult WOCN if necessary    Other skin related issues, ie tears, rash, etc, document in Integumentary flowsheet

## 2023-06-30 NOTE — Significant Event (Signed)
Vital  signs reviewed:   Temp: (!) 96.7 F (35.9 C), Heart Rate: 80, Resp Rate: 20, BP: 113/77    Cardiac exam: Regular rate ectopy    Pulmonary exam:  Rales bases    Peripheral pulses:  Normal    Capillary refill:  Normal    Skin exam:  pink warm    Mental status alert, responsive.  Oriented.    Patient has been started on dobutamine 2.5 mics per kilo per minute, Bumex 1 mg/h.  Diuresed 2 L.  Has had intermittent leg cramps.  Had a 25 beat run of ventricular tachycardia, self-limited.  Labs show potassium 3.8, magnesium 2.3.    Impression, ischemic cardiomyopathy.  Echocardiogram shows severely depressed LV function,  EF 13%.  Global hypokinesis, severe.  Severe mitral regurgitation.  IVC dilated, less than 50% respiratory variance.    Plan: Potassium 10 mEq p.o., magnesium 1 g IV, IV amiodarone given 150 mg IV every 1 hour.  Continue infusion as needed.  Continue diuresis.  Right heart catheterization later this afternoon.  Continue optimizing cardiac therapy.    Case discussed with Dr. Celine Mans and Dr. Webb Silversmith from cardiology.      Exam done by Durward Fortes, MD on 06/30/23 at 4:18 PM  Critical care treatment time: 45 minutes

## 2023-06-30 NOTE — Progress Notes (Signed)
Patient alert and oriented x3, arrived to room 223. Pt short of breath with exertion. Assisted to bed , oriented to room and use of call bell. Plan of care discussed, tele box applied. Call bell within reach.

## 2023-06-30 NOTE — Plan of Care (Signed)
Problem: Everyday - Heart Failure  Goal: Stable Vital Signs and Fluid Balance  Outcome: Progressing  Flowsheets (Taken 06/30/2023 2313)  Stable Vital Signs and Fluid Balance:   Monitor, assess vital signs and telemetry per policy   Monitor labs and report abnormalities to physician   Strict Intake/Output   Assess for swelling/edema   Wean oxygen as needed if appropriate   Daily Standing Weights in the morning using the same scale, after using the bathroom and before breadfast.  If unable to stand, zero the bed and use the bed scale  Goal: Mobility/Activity is Maintained at Optimal Level for Patient  Outcome: Progressing  Flowsheets (Taken 06/30/2023 2313)  Mobility/Activity is Maintained at Optimal Level for Patient:   Increase mobility as tolerated/progressive mobility protocol   Maintain SCD's as Ordered   Assess for changes in respiratory status, level of consciousness and/or development of fatigue     Problem: Hemodynamic Status: Cardiac  Goal: Stable vital signs and fluid balance  Outcome: Progressing  Flowsheets (Taken 06/30/2023 2313)  Stable vital signs and fluid balance:   Assess signs and symptoms associated with cardiac rhythm changes   Monitor lab values     Problem: Inadequate Tissue Perfusion  Goal: Adequate tissue perfusion will be maintained  Outcome: Progressing  Flowsheets (Taken 06/30/2023 2313)  Adequate tissue perfusion will be maintained:   Monitor/assess lab values and report abnormal values   Monitor/assess neurovascular status (pulses, capillary refill, pain, paresthesia, paralysis, presence of edema)   Monitor/assess for signs of VTE (edema of calf/thigh redness, pain)   Monitor for signs and symptoms of a pulmonary embolism (dyspnea, tachypnea, tachycardia, confusion)   Encourage/assist patient as needed to turn, cough, and perform deep breathing every 2 hours

## 2023-06-30 NOTE — Progress Notes (Addendum)
[name] [name]  Progress Note  Patient Info:   Date/Time: 06/30/2023 / 8:19 AM   Admit Date:06/29/2023  Patient Name:[name] [name]   ZOX:[id number]   PCP: Zorita Pang, MD  Attending Physician:Aidenn Skellenger, Olen Pel, MD     Subjective:   06/30/23  Chief Complaint:  Chest Pain, Shortness of Breath, and Cough  Update of Review of Systems:  ROS  Patient informs he is breathing better than yesterday but still has shortness of breath  Unable to lie flat  Shortness of breath worse with exertion  Denies any chest pain or palpitations  Assessment and Plan:          [name] [name] is a 77 y.o. male admitted on 06/29/2023 with SOB.  The patient has a complicated history of cardiomyopathy including an admission for cardiogenic shock at [facility] in February to March of this year.  He was deemed not to be an LVAD candidate for multiple comorbidities.  He additionally was found to have severe functional tricuspid regurgitation.  There has been some consideration for pursuing MitraClip and he has completed TEE.  He has seen Dr. Webb Silversmith in clinic.  He also came to the emergency room on September 7 noting that he is having increasing difficulty sleeping and feels congested in his throat.  He was found to have AKI at that time.  He was unfortunately discharged from the ED at that time with a creatinine at 3.8 when baseline is around 2.5 for him.  He comes back in noticing more shortness of breath.  Some worsening lower extremity edema.  Nausea.  And creatinine is now 4.1. He is short of breath at rest, Unable to lay flat.      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 Celine Mans -will transfer to a ICU and start dobutamine drip-2.5 mcg/kg/min and will likely titrate up to 5 pending level of ectopy  -Possible low-dose nitroprusside  -Right heart cath with plan for leave in [place]  to Guide therapy to salvage renal function and to remain a candidate for potential MItraClip (was teetering as a candidate given EF and LV size)  -Ordered for 4 mg IV Bumex followed by Bumex drip  -Lactate is 3.7  -Case was already discussed with Dr. Webb Silversmith  -Discussed the case with Dr. 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 Laurine Blazer    # HTN  -Blood pressure stable    # HLD  -Continue statins    # Diabetes type 2  -Continue Lantus and sliding scale insulin  -Adjust insulin dose as needed    # History of RLE DVT  -Was treated with Eliquis in the past            Recent Labs     06/30/23  0535 06/29/23  1703   Platelet Count 108* 94*     Diagnosis: Mild Thrombocytopenia             Recent Labs   Lab 06/30/23  0535 06/29/23  1703 06/26/23  0839    Hemoglobin 11.4* 11.4* 10.5*   Hematocrit 37.1* 36.9* 33.8*   MCV 90.3 90.2 90.4   WBC 10.97* 10.44* 11.57*   Platelet Count 108* 94* 110*     Recent Labs   Lab 02/11/23  0839 12/24/22  1513 12/24/22  0912 12/22/22  1430 12/21/22  2247   Ferritin 47.17  --   --  57.00  --    Iron 74  --   --   --  32*   TIBC 340  --   --   --  307   Iron Saturation 22  --   --   --  10*   Folate  --  12.6  --   --   --    Haptoglobin  --   --  165  --   --      Anemia Diagnosis: Chronic: Anemia of Chronic Kidney Disease  DVT Prophylaxis:Medication VTE Prophylaxis Orders: heparin (porcine) injection 5,000 Units  Mechanical VTE Prophylaxis Orders: Maintain sequential compression device   Central Line/Foley Catheter/PICC line status: None  Code Status: Full Code  Disposition:TCC  Type of Admission:Observation   Estimated Length of Stay (including stay in the ER receiving treatment): 3-4 days   Milestones required for discharge: Clinical improvement  Objective:     Vitals:    06/30/23 0039 06/30/23 0251 06/30/23 0439 06/30/23 0740   BP: 115/85 112/65 119/81 113/80   Pulse: 92 91 88 68   Resp: 20  18 19    Temp: 97.3 F (36.3 C)  97.5 F (36.4 C) 98.4 F (36.9 C)   TempSrc: Oral  Oral Oral   SpO2: 98%  98% 100%   Weight:   77.9 kg (171 lb 11.8 oz)    Height:           Physical Exam  Heart - S1, S2 heard.  Holosystolic murmur  Lungs -tachypneic at rest, mild respiratory distress at rest, bilateral air entry present, no wheeze or rales  Abdomen - soft, bowel sounds normal, distended, no tenderness  Central nervous system - no focal deficits  Musculoskeletal - bilateral upper and lower extremity edema 2+    Results of Labs/imaging   Labs and radiology reports have been reviewed.  Link to Dispense report for last 100 days  Link to Reconcile Home meds  [name]   Signed by:   [name] Ping, MD  06/30/2023 8:19 AM      *This note was generated by the Epic EMR system/ Dragon speech recognition and may contain inherent errors or  omissions not intended  by the user. Grammatical errors, random word insertions, deletions, pronoun errors and incomplete sentences are occasional consequences of this technology due to software limitations. Not all errors are caught or corrected. If there are questions or concerns about the content of this note or information contained within the body of this dictation they should be addressed directly with the author for clarification

## 2023-06-30 NOTE — Procedures (Signed)
CRITICAL CARE  [facility]      Arterial Line Insertion Procedure Note     Procedure: Insertion of arterial line, Left radial     Indications: hemodynamic monitoring      Procedure Details A time-out was completed verifying correct patient, procedure, site, and positioning.      Informed consent obtained from patient. Sterile technique was utilized including hand hygeine, mask and sterile gloves.  A time out was performed before the procedure to review the correct, patient, procedure, indication and side.     The patient was placed in supine position. The patient's Left lower arm and hand were prepped and draped in a sterile fashion. Under real time US guidance, an arterial catheter was introduced into the the Left radial artery using a modified Seldinger technique.  The catheter was threaded smoothly over the guide wire and appropriate blood return was obtained. The catheter was then sutured in place to the skin and a sterile dressing applied. Perfusion to the extremity distal to the point of catheter insertion was checked and found to be adequate.     Estimated Blood Loss: < 5cc     No apparent complications. Waveform confirmed on monitor. Pt tolerated procedure well.      Signed by:  Harless Litten, FNP                       [facility]              06/30/23 11:16 PM             Medical Intensive Care Unit           Ext. [phone]          Intermediate Care Unit                       MD Ext: [phone]          NP Ext. [phone]           8p-8a provider Coverage Ex [phone]    Cc: Kirstie Mirza, MD

## 2023-06-30 NOTE — Nursing Progress Note (Signed)
Per tele, patient had 8 beats of Vtach. Patient denies chaest pain, n/v, or dizziness. Complaints of SOB. Attending notified and aware.

## 2023-06-30 NOTE — Consults (Addendum)
[facility] CARDI[facility] CONSULTATION REPORT    [facility]  [facility] [address] MAIN OBSERVATION  [address]  [place] [address]  Dept: [phone]  Loc: [phone]      Date Time: 06/30/23 7:29 AM  Patient Name: [name] [name]  Requesting Physician: Deloris Ping, MD    Reason for Consultation:   Concern for Cardiogenic Shock    History of Present Illness:   [name] [name] is a 77 y.o. male admitted on 06/29/2023.  We have been asked by Deloris Ping, MD to provide cardiac consultation regarding consult for Cardiogenic Shock    The patient has a complicated history of cardiomyopathy including an admission for cardiogenic shock at [facility] in February to March of this year.  He was deemed not to be an LVAD candidate for multiple comorbidities.  He additionally was found to have severe functional tricuspid regurgitation.  There has been some consideration for pursuing MitraClip and he is completed TEE.  He has seen Dr. Webb Silversmith in clinic.  He is also came to the emergency room on September 7 noting that he is having increasing difficulty sleeping and feels congested in his throat.  He was found to have AKI at that time.  He was discharged from the ED at that time with a creatinine at 3.8 when baseline is around 2.5 for him.  He comes back in noticing more shortness of breath.  Some worsening lower extremity edema.  Nausea.  And creatinine is now 4.1. He is short of breath rest when I see him. Unable to lay flat.     Cardiology relevant medical records in Epic chart reviewed.    Past Medical/Surgical History:   Patient  has a past medical history of Acute CHF, Acute systolic (congestive) heart failure (08/2017), CAD (coronary artery disease), Cardiomyopathy, CHF (congestive heart failure) (08/12/2014, 2013), Chronic obstructive pulmonary disease, CLL (chronic lymphocytic leukemia) (10/26/2022), Coronary artery disease (2011), echocardiogram (02/2016, 11/2016,  09/2017, 12/20/2017), GERD (gastroesophageal reflux disease), Heart attack (2011), Hyperlipemia, Hypertension, ICD (implantable 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, Ischemic cardiomyopathy, Nonischemic cardiomyopathy, NSTEMI (non-ST elevated myocardial infarction) (2012), Nuclear MPI (06/2016), Pacemaker (2014), Pneumonia (09/2016), Primary cardiomyopathy, Renal insufficiency, Sepsis (08/2017), Syncope and collapse, Type 2 diabetes mellitus, controlled (DX 1998), and Wheeze.    Patient  has a past surgical history that includes duodenal ulcer (1973); orthopedic surgery (AGE 61); EGD (N/A, 08/15/2014); Circumcision (AGE 69); Fracture surgery (AGE 45); Hernia repair (AGE 69); COLONOSCOPY, DIAGNOSTIC (SCREENING) (2009); Tonsillectomy and adenoidectomy (1954); Cardiac defibrillator placement (12/2012); Cardiac pacemaker placement (2014); EGD (1975); EGD, COLONOSCOPY (N/A, 04/04/2018); ECHOCARDIOGRAM, TRANSTHORACIC (02/2016 11/2016 09/2017 12/2017); MPI nuclear study (06/2016); Correction hammer toe; BIV (11/10/2016); ECHOCARDIOGRAM, TRANSTHORACIC (02/2016,11/2016,12/20/2017); Cardiac catheterization (03/2010); Cardiac catheterization (12/2012); Right Heart Cath (Right, 09/09/2022); Right Heart Cath ONLY incl. CO and Sats (Right, 12/22/2022); Tunneled Cath Placement (Permcath) (N/A, 12/25/2022); Tunneled Cath Removal (Permcath) (Right, 01/04/2023); EGD (02/11/2023); ECHOCARDIOGRAM, TRANSESOPHAGEAL (N/A, 04/21/2023); and REPAIR, HAMMERTOE.    Family History:   Patient's family history includes Breast cancer in his mother; Coronary artery disease in his mother; Diabetes in his brother and mother; Heart attack in his father; Heart attack (age of onset: 49) in his mother; Hypertension in his father and mother.    Social History:   Patient  reports that he has never smoked. He has never used smokeless tobacco. He reports that he does not currently use alcohol. He reports that he does not use drugs.    Allergies:    Allergies[1]  Medications:     Current Outpatient Medications   Medication Instructions    aspirin EC (ASPIRIN EC) 81 mg, Oral, Daily    atorvastatin (LIPITOR) 80 mg, Oral, Daily    Continuous Blood Gluc Sensor (FreeStyle Libre 3 Sensor) Misc 1 Device, Does not apply, Every 14 days    empagliflozin (JARDIANCE) 10 mg, Oral, Daily    hydrALAZINE (APRESOLINE) 50 mg, Oral, Every 8 hours scheduled    insulin glargine (LANTUS) 33 Units, Subcutaneous, At bedtime    insulin lispro (1 Unit Dial) (HUMALOG KWIKPEN) 10 Units, Subcutaneous, 3 times daily before meals    Insulin Pen Needle (B-D UF III MINI PEN NEEDLES) 31G X 5 MM Misc Use as directed daily    isosorbide dinitrate (ISORDIL) 40 mg, Oral, 2 times daily    metoprolol succinate XL (TOPROL-XL) 25 mg, Oral, 2 times daily    niacin (NIASPAN) 500 mg, Oral, 2 times daily    pantoprazole (PROTONIX) 40 mg, Oral, 2 times daily    spironolactone (ALDACTONE) 25 MG tablet TAKE 1/2 TABLET(12.5 MG) BY MOUTH DAILY    Torsemide 80 mg, Oral, Daily    Toujeo SoloStar 300 UNIT/ML injection pen        Inpatient Scheduled Meds: PRN Meds:    aspirin EC, 81 mg, Oral, Daily  atorvastatin, 80 mg, Oral, Daily  heparin (porcine), 5,000 Units, Subcutaneous, Q12H SCH  hydrALAZINE, 50 mg, Oral, Q8H SCH  insulin glargine, 20 Units, Subcutaneous, QHS  insulin lispro, 1-3 Units, Subcutaneous, QHS  insulin lispro, 1-5 Units, Subcutaneous, TID AC  isosorbide dinitrate, 40 mg, Oral, BID  metoprolol succinate XL, 25 mg, Oral, BID  niacin, 500 mg, Oral, BID  pantoprazole, 40 mg, Oral, QAM AC  spironolactone, 12.5 mg, Oral, Daily        Continuous Infusions:   acetaminophen, 650 mg, Q4H PRN  alum & mag hydroxide-simethicone, 15 mL, Q4H PRN  benzonatate, 200 mg, TID PRN  dextrose, 15 g of glucose, PRN   Or  dextrose, 12.5 g, PRN   Or  dextrose, 12.5 g, PRN   Or  glucagon (rDNA), 1 mg, PRN  guaiFENesin-dextromethorphan, 10 mL, Q4H PRN  melatonin, 3 mg, QHS PRN  naloxone, 0.2 mg, PRN  ondansetron, 4 mg,  Q6H PRN   Or  ondansetron, 4 mg, Q6H PRN  senna-docusate, 2 tablet, QHS PRN  traZODone, 50 mg, QHS PRN          Physical Exam:     Vitals:    06/29/23 2302 06/30/23 0039 06/30/23 0251 06/30/23 0439   BP:  115/85 112/65 119/81   Pulse:  92 91 88   Resp:  20  18   Temp: 98.4 F (36.9 C) 97.3 F (36.3 C)  97.5 F (36.4 C)   TempSrc: Axillary Oral  Oral   SpO2:  98%  98%   Weight:    77.9 kg (171 lb 11.8 oz)   Height:         Constitutional: Cooperative, alert - tachypneic at rest.  Neck: No carotid bruits, JVP 12 cm  Cardiac: Regualr3 rate and regular rhythm, normal S1 and S2; +s3. Murmur: out towards the apex which is displaced holosystolic murmur  Pulmonary: Good effort. No Rales. No wheezing. No Rhonchi.  Extremities: 2+ edema. arm  Vascular: +2 pulses in radial artery bilaterally, 2+ pedal pulses bilaterally.    Lab/Test Findings Reviewed:     ECG reviewed:  intermittent a-pacing, v-pacing    Recent Labs     06/30/23  1610 06/29/23  1914 06/29/23  1703   Sodium 135  --  137   Potassium 4.2  --  5.3   Chloride 104  --  107   CO2 17  --  17   BUN 62*  --  59*   Creatinine 4.1*  --  3.8*   Calcium 9.3  --  9.6   Magnesium 2.4  --   --    WBC 10.97*  --  10.44*   Hemoglobin 11.4*  --  11.4*   Hematocrit 37.1*  --  36.9*   Platelet Count 108*  --  94*   hs Troponin  --  67.9* 58.2*   AST (SGOT)  --   --  62*   ALT  --   --  53       ASSESSMENT:   Patient is a 77 y.o. 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 He has a concerning picture with AKI, tachypnea, progressive orthopnea  Obtain Lactate Stat   Transfer to unit that can do Inotropes and start Dobutamine 2.5 mcg/kg/min and will likely titrate up to 5 pending level of ectopy  Despite his renal function, pending RHC - I suspect nitroprusside at low dose will be tolerated okay for him if MAPs remain in this range  Give Bumex 4 mg IV and then start 1 mg/h drip  Needs RHC with Leave-In [place] to Guide therapy in my opinion to salvage renal function; possible Clip candidate  Other GDMT: Stop BB, pending about Nipride  Reviewed briefly with Dr. Webb Silversmith who will see later today and aim for leave-in [place] here at [facility].     Addendum: Robust response to Diuretics and Dobutamine which normalized Lactate (initially 3.2). Having ectopy. Agree with electrolyte repletion. Will decrease Dobutamine slightly. Pending RHC    Addendum: RHC completed, RA 19, RV 44/25, PA 43/23 (mean 33), PCWP 27, Fick CO/CI 3.4/1.6 and TD 2/0.9. SVR 2640. CPO 0.3, PAPI at 1.05. API 1. Significant biventricular dysfunction, de-coupled LV/RV. Reviewed with Dr. Webb Silversmith. If not temporary MCS candidate as without clear exit strategy, will try meds:  - Dobutamine 5 mcg/kg/min  - Bumex 1 mg/hr  - Start Nitroprusside and use max dose 1 mcg/kg/min for now given renal dysfunction; will need a-line; Thiocyanate level unable to measured here and thus will  check cyanide level and goal  MA 65-75 and SBP > 90  - Will hold Hydral/ISDN as likely not being absorbed    Recommendations discussed with primary medical team.  -------------------------------------------------------------------------------------  Signed by:     Alda Ponder, MD   [facility]    This patient has a high probability of sudden clinically significant deterioration which requires the highest level of physician preparedness to intervene urgently. To summarize, the patient is critically ill due to cardiovascular system failure and we are providing the following life or organ supporting interventions and critical care services: Cardiogenic Shock and Renal Failure that I managed/supervised and that required frequent physician assessments. I devoted my full attention in the ICU to the direct care of this patient for this period of time. I spent 48 minutes providing the critical care services noted and this time is exclusive of teaching, billable procedures, and not overlapping with any other providers.     Contact Information:     [facility] Information   [facility]  Secure Chat (Group):   [facility] [place] [facility]    APP Spectralink:  [phone]    MD Spectralink :  [phone]  [phone]    After hours, non urgent consult line:  [phone]    After hours, physician on-call:  [phone] [facility]  Secure Chat (Group):   [facility] [place] [facility]    APP Spectralink:  [phone]    MD Spectralink :  [phone]      After hours, non urgent consult line:  [phone]    After hours, physician on-call:  [phone] [facility]  Secure Chat (Group):   [facility]    APP Spectralink:  [phone]    MD Spectralink :  [phone]      After hours, non urgent consult line:  [phone]    After hours, physician on-call:  [phone] [facility]  Secure Chat (Group):   [facility]    APP Spectralink:  [phone]    MD Spectralink :  [phone]      After hours, non urgent consult  line:  [phone]    After hours, physician on-call:  [phone]     This note was generated by the Dragon speech recognition and may contain errors or omissions not intended by the user. Grammatical errors, random word insertions, deletions, pronoun errors, and incomplete sentences are occasional consequences of this technology due to software limitations. Not all errors are caught or corrected. If there are questions or concerns about the content of this note or information contained within the body of this dictation, they should be addressed directly with the author for clarification.         [1]   Allergies  Allergen Reactions    Allopurinol Edema

## 2023-06-30 NOTE — Progress Notes (Addendum)
CATH LAB PROCEDURE HANDOFF REPORT    Date Time: 06/30/23 4:56 PM    INDICATIONS:    congestive heart failure  POST PROCEDURE DEBRIEF:    Right heart cath, leave in swan    Instrument/sponge/needle counts, if applicable:  N/A  Specimen labeling, read labels aloud, including name:  N/A  Any applicable equipment problems to be addressed:  N/A    Per MD:  Key concerns for recovery/management: bleeding/hematoma and hemodynamics  ALLERGIES:    Allopurinol   ACC BLEEDING RISK SCORE       MEDICAL HISTORY:    Medical History[1]   ACCESS:    8.5 sheath in right internal jugular vein  Hemostasis: sutured to R neck  SWAN noted at 49 cm  Visual appearance: clean/dry/intact with good distal pulses   MEDICATIONS:    Lidocaine subQ  VITALS:    HR:59           Rhythm: sinus rhythm and paced rhythm       BP: 104/73   O2 SAT: 100% on 3 L NC        PROCEDURE DETAILS:    Outcomes: See physician note                                Last ACT: No results found for: "ISTATACTKAOL"       Final Chest Pain Assessment::0/10    Report given to: [facility]na  ICU  RN    See Physicians Op note/ Report for details    SAFETY CHECKLIST   I, Kirk Ruths, RN, along with Tobi Bastos, have validated the following safety critical items:    Patient ID band present  IV is patent  pump meds and rates correct and current as documented in the [facility]  Patient transported on oxygen   with adequate capacity within the cylinder, and  regulator and valve in the ON position, and   Pulses checked and documented in Epic for femoral access and peripheral vascular cases  Sites dressed, sheath(s) capped with occlusive caps  Patient transported on monitor  EKG attached  Pulse oximetry attached  Swan-Ganz catheter present  PA waveform monitored  Balloon deflated  Syringe locked empty  Last vital signs stable and documented         [1]   Past Medical History:  Diagnosis Date    Acute CHF     NOV  & DEC 2018, - DIALYSIS CATH PLACED Aug 24 2017 TO REMOVE FLUID     Acute systolic  (congestive) heart failure 08/2017    CAD (coronary artery disease)     Cardiomyopathy     nonischemic    CHF (congestive heart failure) 08/12/2014, 2013    Chronic obstructive pulmonary disease     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    Coronary artery disease 2011    CORONARY STENT PLACEMENT FOLLOWED BY [facility]    echocardiogram 02/2016, 11/2016, 09/2017, 12/20/2017    GERD (gastroesophageal reflux disease)     Heart attack 2011    and 2014    Hyperlipemia     Hypertension     116/80    ICD (implantable 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     PLACED 2014  [facility]  LAST INTERROGATION April 01 2018 REPORT REQUESTED    Ischemic cardiomyopathy     EF 15% ON ECHO 09-20-2017 DR GARG IN EPIC    Nonischemic cardiomyopathy     NSTEMI (non-ST elevated  myocardial infarction) 2012    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

## 2023-06-30 NOTE — Plan of Care (Signed)
Problem: Pain interferes with ability to perform ADL  Goal: Pain at adequate level as identified by patient  06/30/2023 0354 by Ernie Hew, RN  Outcome: Progressing  Flowsheets (Taken 06/30/2023 0354)  Pain at adequate level as identified by patient:   Identify patient comfort function goal   Assess for risk of opioid induced respiratory depression, including snoring/sleep apnea. Alert healthcare team of risk factors identified.   Assess pain on admission, during daily assessment and/or before any "as needed" intervention(s)   Reassess pain within 30-60 minutes of any procedure/intervention, per Pain Assessment, Intervention, Reassessment (AIR) Cycle  06/30/2023 0354 by Ernie Hew, RN  Outcome: Progressing     Problem: Side Effects from Pain Analgesia  Goal: Patient will experience minimal side effects of analgesic therapy  06/30/2023 0354 by Ernie Hew, RN  Outcome: Progressing  06/30/2023 0354 by Ernie Hew, RN  Outcome: Progressing     Problem: Compromised Moisture  Goal: Moisture level Interventions  06/30/2023 0354 by Ernie Hew, RN  Outcome: Progressing  06/30/2023 0354 by Ernie Hew, RN  Outcome: Progressing     Problem: Compromised Activity/Mobility  Goal: Activity/Mobility Interventions  06/30/2023 0354 by Ernie Hew, RN  Outcome: Progressing  Flowsheets (Taken 06/30/2023 0354)  Activity/Mobility Interventions: Pad bony prominences, TAP Seated positioning system when OOB, Promote PMP, Reposition q 2 hrs / turn clock, Offload heels  06/30/2023 0354 by Ernie Hew, RN  Outcome: Progressing     Problem: Moderate/High Fall Risk Score >5  Goal: Patient will remain free of falls  06/30/2023 0354 by Ernie Hew, RN  Outcome: Progressing  06/30/2023 0354 by Ernie Hew, RN  Outcome: Progressing     Problem: Day of Admission - Heart Failure  Goal: Heart Failure Admission  Outcome: Progressing  Flowsheets (Taken 06/30/2023 0354)  Heart Failure Admission:   Standing Weight on admission, if unable to  stand zero the bed and use the bed scale   Vital signs and telemetry per policy   Strict Intake/Output   Oxygen as needed   Assess for swelling/edema and document   Initiate education with patient and caregiver using CHF Warning Zones and Educational Videos ([employer] or [employer])     Problem: Everyday - Heart Failure  Goal: Stable Vital Signs and Fluid Balance  Outcome: Progressing  Flowsheets (Taken 06/30/2023 0354)  Stable Vital Signs and Fluid Balance:   Daily Standing Weights in the morning using the same scale, after using the bathroom and before breadfast.  If unable to stand, zero the bed and use the bed scale   Monitor, assess vital signs and telemetry per policy   Monitor labs and report abnormalities to physician   Assess for swelling/edema   Strict Intake/Output  Goal: Mobility/Activity is Maintained at Optimal Level for Patient  Outcome: Progressing  Flowsheets (Taken 06/30/2023 0354)  Mobility/Activity is Maintained at Optimal Level for Patient:   Maintain SCD's as Ordered   Perform active/passive ROM  Goal: Nutritional Intake is Adequate  Outcome: Progressing  Goal: Teaching-Using CHF Warning Zones and Educational Videos  Outcome: Progressing  Flowsheets (Taken 06/30/2023 0354)  Teaching-Using CHF Warning Zones and Educational Videos:   Signs & Symptoms of CHF   CHF Warning Zones and when to call for help   Daily Standing Weights & record     Problem: Day of Discharge - Heart Failure  Goal: Discharge Education  Outcome: Progressing

## 2023-06-30 NOTE — Nursing Progress Note (Signed)
Patient transferred to ICU Rm 10. Report given to [place].

## 2023-06-30 NOTE — Consults (Addendum)
1610: CM attempted to contact patient and his wife to complete initial discharge planning assessment. No answer at this time. CM will attempt again at a later time.    1400: CM made second attempt to reach wife. No answer. CM left VM requesting call back.    1535: CM made third attempt to reach wife. No answer. Still no call back after VM left earlier.    Dolores Frame, LCSW  Social Work Case Manager  [facility]

## 2023-06-30 NOTE — Consults (Signed)
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 Thank you for allowing [place] to participate in the care of this patient.    Simonne Maffucci, MD  Epic Secure Chat  Office - [phone]  History:   [name] [name] is a 77 y.o. 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    Coronary artery disease 2011    CORONARY STENT PLACEMENT FOLLOWED BY [facility]    echocardiogram 02/2016, 11/2016, 09/2017, 12/20/2017    GERD (gastroesophageal reflux disease)     Heart attack 2011    and 2014    Hyperlipemia     Hypertension     116/80    ICD (implantable 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     PLACED 2014  [place]  LAST INTERROGATION April 01 2018 REPORT REQUESTED    Ischemic cardiomyopathy     EF 15% ON ECHO 09-20-2017 DR  GARG IN EPIC    Nonischemic cardiomyopathy     NSTEMI (non-ST elevated myocardial infarction) 2012    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    Pacemaker 2014    MEDTRONIC ICD/PACEMAKER COMBO LAST INTERROGATION  12-12 2018    Pneumonia 09/2016    Primary cardiomyopathy     Ischemic cardiomyopathy EF 15% ON ECHO 09-20-2017 DR GARG IN EPIC    Renal insufficiency     Sepsis 08/2017    Syncope and collapse     PRIOR TO ICD/PACEMAKER PLACED 2014    Type 2 diabetes mellitus, controlled DX 1998    A1c ~6.0 (02/2023) FBS~110    Wheeze     PT SEE HIS PMD April 01 2018 RE THIS-TOLD TO SEE PMD BY [facility] JUNE 12 WHEN THEY SAW HIM FOR A CL     Past Surgical History:   Procedure Laterality Date    BIV  11/10/2016    ICD METRONIC  UPGRADE     CARDIAC CATHETERIZATION  03/2010    CORONARY STENT PLACEMENT X 2 PER PT; LM normal, LAD 70-80% distal lesion at apex, 20% lesion  mid CFX    CARDIAC CATHETERIZATION  12/2012    CARDIAC DEFIBRILLATOR PLACEMENT  12/2012    [facility]    CARDIAC PACEMAKER PLACEMENT  2014    MEDTRONIC ICD/PACEMAKER PLACED [facility]    CIRCUMCISION  AGE 18    COLONOSCOPY, DIAGNOSTIC (SCREENING)  2009    CORRECTION HAMMER TOE      duodenal ulcer  1973    STRESS RELATED IN SCHOOL    ECHOCARDIOGRAM, TRANSESOPHAGEAL N/A 04/21/2023    Procedure: ECHOCARDIOGRAM, TRANSESOPHAGEAL;  Surgeon: George Hugh, MD;  Location: [facility];  Service: Cardiology;  Laterality: N/A;  Probe #[id number]    ECHOCARDIOGRAM, TRANSTHORACIC  02/2016 11/2016 09/2017 12/2017    ECHOCARDIOGRAM, TRANSTHORACIC  02/2016,11/2016,12/20/2017    EGD N/A 08/15/2014    Procedure: EGD;  Surgeon: Colon Branch, MD;  Location: [facility];  Service: Gastroenterology;  Laterality: N/A;  egd w/ bx    EGD  1975    EGD  02/11/2023    Procedure: EGD and clip placement;  Surgeon: Doyne Keel, MD;  Location: [facility];  Service: Gastroenterology;;    EGD, COLONOSCOPY N/A 04/04/2018    Procedure: EGD with bxs, COLONOSCOPY with polypectomy and clipping;   Surgeon: Doyne Keel, MD;  Location: [facility];  Service: Gastroenterology;  Laterality: N/A;    FRACTURE SURGERY  AGE 61     RT  ANKLE- CAST APPLIED    HERNIA REPAIR  AGE 18    LEFT INGUINAL HERNIA    MPI nuclear study  06/2016    orthopedic surgery  AGE 69    RT foot corrective - HAMMERTOE    REPAIR, HAMMERTOE      RIGHT HEART CATH Right 09/09/2022    Procedure: Right Heart Cath;  Surgeon: Burna Forts, MD;  Location: [facility];  Service: Cardiovascular;  Laterality: Right;  tx from FO    RIGHT HEART CATH ONLY INCL. CO AND SATS Right 12/22/2022    Procedure: Right Heart Cath ONLY incl. CO and Sats;  Surgeon: Day, Bertram Millard, MD;  Location: [facility];  Service: Cardiovascular;  Laterality: Right;  Right Heart Cath with leave in PA cath for milrinone initiation    TONSILLECTOMY AND ADENOIDECTOMY  1954    TUNNELED CATH PLACEMENT ([facility]) N/A 12/25/2022    Procedure: [facility];  Surgeon: Tamala Bari, MD;  Location: [facility];  Service: Interventional Radiology;  Laterality: N/A;    TUNNELED CATH REMOVAL ([facility]ATH) Right 01/04/2023    Procedure: TUNNELED CATH REMOVAL;  Surgeon: Rex Kras, MD;  Location: [facility];  Service: Interventional Radiology;  Laterality: Right;     Family History:     Family History   Problem Relation Age of Onset    Breast cancer Mother     Heart attack Mother 40    Hypertension Mother     Diabetes Mother     Coronary artery disease Mother     Diabetes Brother     Heart attack Father     Hypertension Father      Social History:   Social History[1]  Allergies:   Allergies[2]  Medications:     Current Facility-Administered Medications   Medication Dose Route Frequency    aspirin EC  81 mg Oral Daily    atorvastatin  80 mg Oral Daily    heparin (porcine)  5,000 Units Subcutaneous Q12H SCH    hydrALAZINE  50 mg Oral [facility]  insulin glargine  20 Units Subcutaneous QHS    insulin lispro  1-3 Units Subcutaneous QHS    insulin lispro  1-5 Units  Subcutaneous TID AC    isosorbide dinitrate  40 mg Oral BID    pantoprazole  40 mg Oral QAM AC     Physical Exam:   Temp:  [96.9 F (36.1 C)-98.4 F (36.9 C)] 97.5 F (36.4 C)  Heart Rate:  [68-96] 81  Resp Rate:  [15-30] 21  BP: (103-137)/(65-94) 106/76  Intake and Output Summary (Last 24 hours) at Date Time    Intake/Output Summary (Last 24 hours) at 06/30/2023 1131  Last data filed at 06/30/2023 1117  Gross per 24 hour   Intake 1700 ml   Output 1650 ml   Net 50 ml       Gen: Well developed, no acute distress  CV: S1 S2 N RRR no M/R/G/C, 1+ edema  Chest: Good effort, decreased BS at bases  Labs:     Recent Labs   Lab 06/30/23  0804 06/30/23  0535 06/29/23  1703 06/26/23  0839   Glucose  --  308* 248* 260*   BUN  --  62* 59* 59*   Creatinine  --  4.1* 3.8* 3.8*   Calcium  --  9.3 9.6 9.2   Sodium  --  135 137 138   Potassium  --  4.2 5.3 4.4   Chloride  --  104 107 106   CO2  --  17 17 21    Albumin  --   --  3.6 3.4*   Magnesium 2.4 2.4  --  2.4     Recent Labs   Lab 06/30/23  0535 06/29/23  1703 06/26/23  0839   WBC 10.97* 10.44* 11.57*   RBC 4.11* 4.09* 3.74*   Hemoglobin 11.4* 11.4* 10.5*   Hematocrit 37.1* 36.9* 33.8*   MCV 90.3 90.2 90.4   MCH 27.7 27.9 28.1   MCHC 30.7* 30.9* 31.1*   RDW 19* 19* 19*   Platelet Count 108* 94* 110*     Radiology:   Radiological Procedure personally reviewed.   EKG:   Reviewed  Prior Records:   I reviewed the old records.               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, history    Tobacco Use    Smoking status: Never    Smokeless tobacco: Never   Vaping Use    Vaping status: Never Used   Substance and Sexual Activity    Alcohol use: Not Currently    Drug use: No    Sexual activity: Not Currently     Partners: Female     Social Determinants of Health     Financial Resource Strain: Low Risk  (01/17/2023)    Overall Financial Resource  Strain (CARDIA)     Difficulty of Paying Living Expenses: Not hard at all   Food Insecurity: No Food Insecurity (06/29/2023)    Hunger Vital Sign     Worried About Running Out of Food in the Last Year: Never true     Ran Out of Food in the Last Year: Never true   Transportation Needs: No Transportation Needs (06/29/2023)    PRAPARE - Transportation     Lack of Transportation (  Medical): No     Lack of Transportation (Non-Medical): No   Physical Activity: Insufficiently Active (01/17/2023)    Exercise Vital Sign     Days of Exercise per Week: 1 day     Minutes of Exercise per Session: 10 min   Stress: Stress Concern Present (01/17/2023)    [employer] of Occupational Health - Occupational Stress Questionnaire     Feeling of Stress : To some extent   Social Connections: Moderately Integrated (01/17/2023)    Social Connection and Isolation Panel [NHANES]     Frequency of Communication with Friends and Family: More than three times a week     Frequency of Social Gatherings with Friends and Family: Once a week     Attends Religious Services: More than 4 times per year     Active Member of [employer] or Organizations: No     Attends Banker Meetings: Never     Marital Status: Married   Catering manager Violence: Not At Risk (06/29/2023)    Humiliation, Afraid, Rape, and Kick questionnaire     Fear of Current or Ex-Partner: No     Emotionally Abused: No     Physically Abused: No     Sexually Abused: No   Housing Stability: Low Risk  (06/29/2023)    Housing Stability Vital Sign     Unable to Pay for Housing in the Last Year: No     Number of Times Moved in the Last Year: 1     Homeless in the Last Year: No   [2]   Allergies  Allergen Reactions    Allopurinol Edema

## 2023-06-30 NOTE — Procedures (Signed)
CARDIAC CATH LAB REPORT    PROCEDURES PERFORMED  Right heart catheterization    LOCATION  [facility]    INDICATION  Decompensated heart failure / cardiogenic shock    PRE-PROCEDURE DIAGNOSIS  SCAI B/C Cardiogenic shock  NICM, EF 15%  AKI on CKD  Severe MR  Severe TR  HTN, HLD, CLL, DM     POST-PROCEDURE DIAGNOSIS  Same       DESCRIPTION OF PROCEDURE    After informed consent was obtained, electrocardiographic and hemodynamic monitoring was established.  The patient was prepped and draped in the usual sterile fashion.  Subcutaneous lidocaine was administered over the access site.    RIGHT HEART CATHETERIZATION  Access:  Right IJ vein. Ultrasound-guided, Seldinger technique. 84F Terumo sheath.  Through the sheath, the right heart catheterization was performed using a 7.45F balloon-tipped cathter to obtain RA, RV, PA, PCW pressures and PA saturations.  Cardiac output performed/calculated via Fick and thermodilution methods.  Patient tolerated the procedure well with  no complications    Hemostasis: sutured in place  Contrast (mL): 0 (zero)  Sedation: Fentanyl and Versed  Estimated blood loss (mL): <30    FINDINGS    Right Heart Catheterization  RA: 19 mmHg  RV: 44/25 mmHg   PA: 42/23 mmHg and mean 33 mmHg  PCWP: 27 mmHg  PA sat: 46.5%    Fick: CO 3.37 L/min and CI 1.59 L/min/m2     TD: CO 1.9 L/min and CI 0.9 L/min/m2  SVR: 2640 dynes/seconds/cm-5       SUMMARY    Cardiogenic shock with reduced Co/CI, elevated filling pressures, and elevated SVR  Complications: None    RECOMMENDATIONS    Disposition: Return to ICU for ongoing care  Medications: Per advanced heart failure team    The findings were discussed with the patient and/or family. All questions were answered      Belinda Fisher, MD, [facility], FSCAI  [facility], Interventional Cardiology

## 2023-06-30 NOTE — Significant Event (Deleted)
.  sepsis

## 2023-06-30 NOTE — Progress Notes (Signed)
CRITICAL CARE  [facility]     [facility]- Critical Care Note     ICU Daily Progress Note        Date Time: 06/30/23 11:11 PM  Patient Name: [name] [name]  Attending Physician: Lawernce Ion, MD  Room: IC10/IC10-A   Admit Date: 06/29/2023  LOS: 0 days      Assessment:   Patient with nonischemic cardiomyopathy, admitted with cardiogenic shock left ventricular ejection fraction on this admission echocardiogram 13 % underwent right heart catheterization.  Pulmonary capillary wedge pressure 27 mmHg Fick cardiac output 3.37 L/min, cardiac index 1.59 L/min/m SVR 2640 dynes/s/cm  Hypokalemia repleted.  Medical therapy as outlined below.  Diabetes on insulin sliding scale plus long-acting.  Acute on chronic kidney disease, creatinine up to 4.1, monitoring labs and urine output.  Urine output today 4.1 L.  Plan:   IV dobutamine, which was decreased from 5 mcg/kg/min to 2.5 mcg/kg/min due to ventricular ectopy.    IV nitro started after arterial line placement.  Continue IV bumetanide infusion.  IV amiodarone infusion due to ventricular ectopy.  Controlled.  Case discussed with Dr. Webb Silversmith from advanced heart failure cardiology and Dr. Celine Mans from [facility]  OTHER:  Glycemic Control. Glucose stabilizer per ICU protocol when on insulin drip. Maintain blood glucose 140-180.   Replace electrolytes per ICU electrolyte replacement protocol    Aggressive pulmonary toileting. Incentive spirometry when appropriate. Aspiration precautions.      Quality Care: Stress ulcer prophylaxis, DVT prophylaxis, HOB elevated, Infection control all reviewed and addressed.  Events and notes from last 24 hours reviewed. Care plan discussed with nursing.     CC TIME: >50 min     Subjective:   Feels better no acute distress.  Had leg cramps earlier today which resolved  Medications:       Scheduled Meds: PRN Meds:    aspirin EC, 81 mg, Oral, Daily  atorvastatin, 80 mg, Oral, Daily  heparin (porcine), 5,000 Units,  Subcutaneous, Q12H SCH  insulin glargine, 20 Units, Subcutaneous, QHS  insulin lispro, 1-3 Units, Subcutaneous, QHS  insulin lispro, 1-5 Units, Subcutaneous, TID AC  pantoprazole, 40 mg, Oral, QAM AC        Continuous Infusions:   amiodarone 1 mg/min (06/30/23 1827)    Followed by    Melene Muller ON 07/01/2023] amiodarone      bumetanide 1 mg/hr (06/30/23 1004)    DOBUTamine 2.5 mcg/kg/min (06/30/23 2146)    nitroprusside      acetaminophen, 650 mg, Q4H PRN  alum & mag hydroxide-simethicone, 15 mL, Q4H PRN  benzonatate, 200 mg, TID PRN  dextrose, 15 g of glucose, PRN   Or  dextrose, 12.5 g, PRN   Or  dextrose, 12.5 g, PRN   Or  glucagon (rDNA), 1 mg, PRN  guaiFENesin-dextromethorphan, 10 mL, Q4H PRN  melatonin, 3 mg, QHS PRN  naloxone, 0.2 mg, PRN  ondansetron, 4 mg, Q6H PRN  senna-docusate, 2 tablet, QHS PRN  traZODone, 50 mg, QHS PRN          Physical Exam:     Vitals:    06/30/23 2130 06/30/23 2146 06/30/23 2200 06/30/23 2230   BP: 125/81 113/81 114/80 103/65   Pulse: 81  80 69   Resp: 18  22 (!) 24   Temp:       TempSrc:       SpO2: 100%  100% 99%   Weight:       Height:  Temp (24hrs), Avg:97.4 F (36.3 C), Min:96.7 F (35.9 C), Max:98.4 F (36.9 C)       09/10 0701 - 09/11 0700  In: 1600 [P.O.:1600]  Out: 600 [Urine:600]     General Appearance: No acute distress  Mental status: Alert and oriented  Neuro: Intact speech and sensorimotor exam  H & N:   Lungs: Basilar crackles  Cardiac: Regular normal S1-S2, positive S3.  Systolic murmur  Abdomen: Soft nontender  Extremities: Plus edema  Skin: Warm no rash +2 pulses peripherally    Data:      Labs:     Recent CBC   Recent Labs   Lab 06/30/23  0535 06/29/23  1703 06/26/23  0839   WBC 10.97* 10.44* 11.57*   RBC 4.11* 4.09* 3.74*   Hemoglobin 11.4* 11.4* 10.5*   Hematocrit 37.1* 36.9* 33.8*   MCV 90.3 90.2 90.4   Platelet Count 108* 94* 110*       Recent Labs   Lab 06/30/23  1839 06/30/23  1300 06/30/23  0804 06/30/23  0535 06/29/23  1914 06/29/23  1703  06/26/23  1110 06/26/23  0839   Sodium 141 138  --  135  --  137  --  138   Potassium 3.5 3.8  --  4.2  --  5.3  --  4.4   Chloride 106 107  --  104  --  107  --  106   CO2 22 19  --  17  --  17  --  21   Glucose 210* 244*  --  308*  --  248*  --  260*   BUN 59* 62*  --  62*  --  59*  --  59*   Creatinine 3.5* 3.7*  --  4.1*  --  3.8*  --  3.8*   Magnesium 2.7* 2.3 2.4 2.4  --   --   --  2.4   AST (SGOT)  --   --   --   --   --  62*  --  29   ALT  --   --   --   --   --  53  --  28   Alkaline Phosphatase  --   --   --   --   --  159*  --  171*   Bilirubin, Total  --   --   --   --   --  1.7*  --  1.5*   PTT  --   --   --   --  27  --   --   --    D-Dimer  --   --   --   --  0.94*  --   --   --    hs Troponin  --   --   --   --  67.9* 58.2* 69.4* 72.6*       Rads:     Radiology Results (24 Hour)       Procedure Component Value Units Date/Time    XR Chest AP Portable [[id number]] Collected: 06/30/23 1841    Order Status: Completed Updated: 06/30/23 1845    Narrative:      HISTORY: Arrhythmia. Pulmonary artery catheter placement.     COMPARISON: CXR 10 September, 2024    FINDINGS:   Cardiac silhouette enlargement appears increased, and mild edema has  developed. The lungs are hypoinflated. The pulmonary artery catheter tip  overlies the proximal right pulmonary artery. The  defibrillator remains. No  pleural effusion or pneumothorax is evident. The left apex is excluded.      Impression:          1. Pulmonary artery catheter tip at the proximal right pulmonary artery.  2. CHF.    Nelta Numbers, MD  06/30/2023 6:43 PM            Radiological Imaging personally reviewed.    I have personally reviewed the patient's history and 24 hour interval events, along with vitals, labs, radiology images and  02settings and additional findings found in detail within ICU team notes, with their care plans developed with and reviewed by me.     Time spent in patient evaluation and treatment in critical care excluding procedures, and not  overlapping any other providers: 50    minutes.    Signed by: Durward Fortes, MD, MD  Date/Time: 06/30/23 11:11 PM

## 2023-06-30 NOTE — Progress Notes (Signed)
Pt having coughing spells and unable to sleep, very restless. Attending MD notified and gave order for cough syrup, PRN melatonin given.

## 2023-06-30 NOTE — Plan of Care (Signed)
Problem: Pain interferes with ability to perform ADL  Goal: Pain at adequate level as identified by patient  Outcome: Progressing  Flowsheets (Taken 06/30/2023 1023)  Pain at adequate level as identified by patient: Identify patient comfort function goal     Problem: Compromised Moisture  Goal: Moisture level Interventions  Outcome: Progressing  Flowsheets (Taken 06/30/2023 1023)  Moisture level Interventions: Moisture wicking products, Moisture barrier cream     Problem: Compromised Activity/Mobility  Goal: Activity/Mobility Interventions  Outcome: Progressing  Flowsheets (Taken 06/30/2023 1023)  Activity/Mobility Interventions: Pad bony prominences, TAP Seated positioning system when OOB, Promote PMP, Reposition q 2 hrs / turn clock, Offload heels     Problem: Moderate/High Fall Risk Score >5  Goal: Patient will remain free of falls  Outcome: Progressing  Flowsheets (Taken 06/30/2023 1023)  Moderate Risk (6-13):   MOD-Perform dangle, stand, walk (DSW) prior to mobilization   MOD-Remain with patient during toileting     Problem: Everyday - Heart Failure  Goal: Stable Vital Signs and Fluid Balance  Outcome: Progressing  Flowsheets (Taken 06/30/2023 1023)  Stable Vital Signs and Fluid Balance:   Monitor, assess vital signs and telemetry per policy   Monitor labs and report abnormalities to physician   Strict Intake/Output   Fluid Restriction   Assess for swelling/edema   Wean oxygen as needed if appropriate  Goal: Mobility/Activity is Maintained at Optimal Level for Patient  Outcome: Progressing  Flowsheets (Taken 06/30/2023 1023)  Mobility/Activity is Maintained at Optimal Level for Patient:   Increase mobility as tolerated/progressive mobility protocol   Maintain SCD's as Ordered   Perform active/passive ROM  Goal: Teaching-Using CHF Warning Zones and Educational Videos  Outcome: Progressing  Flowsheets (Taken 06/30/2023 1023)  Teaching-Using CHF Warning Zones and Educational Videos:   Signs & Symptoms of CHF   Daily  Standing Weights & record   CHF Warning Zones and when to call for help   Medications   Fluid Restriction if appropriate   Document in the Education Tab in EPIC with Teach-back

## 2023-06-30 NOTE — Consults (Incomplete)
Initial Case Management Assessment and Discharge Planning  [facility]   Patient Name: [name] [name], [name] [name]   Date of Birth 08-22-1946   Attending Physician: Deloris Ping, MD   Primary Care Physician: Zorita Pang, MD   Length of Stay 0   Reason for Consult / Chief Complaint 1. Acute on chronic congestive heart failure, unspecified heart failure type    2. Chronic kidney disease, unspecified CKD stage    3. Thrombocytopenia    4. Elevated blood pressure reading            PCP: Zorita Pang, MD    Preferred Pharmacy @ D/C:      [id number] [place], [place] - [address] AT [facility] OF [address]  [address]  [place] [address]  Phone: [phone] Fax: [phone]    [facility] #[id number] - [id number], [place] - [address] AT [facility] [address]  [address]  [place] [address]  Phone: [phone] Fax: [phone]    [facility] #[id number] - [place], [place] - [address] AT [facility] [address]  [address]  [place] [address]  Phone: [phone] Fax: [phone]    CM spoke with * via phone to complete initial discharge planning assessment. Demographics on face sheet were verified.    Healthcare Agent:   wife Brenton Hoaglin     Living Arrangements: Patient lives with his wife in a one level home, no steps to enter    Prior level of functioning:    - ADL's: independent    - Ambulation: independent    - Driving: wife transports as needed    DME: blood pressure monitor     Home or community services: Hx of using [facility]     Past/present rehab: [facility]     Support systems: Wife is primary support     Social/financial barriers: none reported     Discharge Transportation:     D/C Plan A: {DISPOSITION:30674}    D/C Plan B: {DISPOSITION:30674}    BARRIERS TO DISCHARGE:  Medical readiness    Dolores Frame, LCSW  Social Work Case Manager  [facility]

## 2023-06-30 NOTE — Consults (Addendum)
Addendum, 510pm:  RHC  RA: 19 mmHg  RV: 44/25 mmHg   PA: 42/23 mmHg and mean 33 mmHg  PCWP: 27 mmHg  PA sat: 46.5%    Fick: CO 3.37 L/min and CI 1.59 L/min/m2     TD: CO 1.9 L/min and CI 0.9 L/min/m2  SVR: 2640 dynes/seconds/cm-5    Recommendations:  - q6 hours, lactate, Fick CO, CI, and SVR calculation  - Portable CXR each morning to evaluate PA cath location    -We need to reduce SVR to the range [phone] dynes/s/cm   -Recommend:  arterial line for BP monitoring      Nitroprusside infusion with goal SBP > 90 and MAP >65    -The cardiac output and input are low despite Dobutamine 2.35mcg/kg/min   -Recommend:  increase dobutamine to 21mcg/kg/min      Continue amiodarone for NSVT occurring earlier today    -The filling pressures remain elevated with RA 19 and PW 27   -Recommend:  continue diuresis with bumex 1mg /hr    -If hypotension (SBP<90, MAP<60) develops:   -Recommend: we will not be able to continue nitroprusside      If hypotension persists, levophed is preferred.  Avoid using phenylepherine.    [name] Soho, MD  Adv HF  [facility]  [place]  -----------------------------------------------------------------------------------------------------------------------------    Advanced Heart Failure and Transplant Cardiology Note    [name] [name] is a very pleasant 77 y.o. 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  He has been more active at home for the last 6 months.  MV clip has been considered.      He was admitted yesterday with worsening HF symptoms to include weight gain, LE edema, and DOE.  Dr. Alda Ponder consulted this morning.  He was found to be in SCAI B-C shock and transferred to the ICU.  He was given bumex IV then infusion.   Dobutamine 2.8mcg/kg/min was started.  PA cath pending.    Scheduled Meds:  Current Facility-Administered Medications   Medication Dose Route Frequency    aspirin EC  81 mg Oral Daily    atorvastatin  80 mg Oral Daily    heparin (porcine)  5,000 Units Subcutaneous Q12H SCH    hydrALAZINE  50 mg Oral Q8H SCH    insulin glargine  20 Units Subcutaneous QHS    insulin lispro  1-3 Units Subcutaneous QHS    insulin lispro  1-5 Units Subcutaneous TID AC    isosorbide dinitrate  40 mg Oral BID    pantoprazole  40 mg Oral QAM AC     Continuous Infusions:   bumetanide 1 mg/hr (06/30/23 1004)    DOBUTamine 2.5 mcg/kg/min (06/30/23 1004)     PRN Meds:.acetaminophen, alum & mag hydroxide-simethicone, benzonatate, dextrose **OR** dextrose **OR** dextrose **OR** glucagon (rDNA), guaiFENesin-dextromethorphan, melatonin, naloxone, ondansetron **OR** ondansetron, senna-docusate, traZODone    PE:  BP 106/76   Pulse 81   Temp 97.5 F (36.4 C) (Temporal)   Resp 21   Ht 1.829 m (6')   Wt 90.4 kg (199 lb 4.7 oz)   SpO2 98%   BMI 27.03 kg/m     Constitutional: Cooperative, alert - tachypneic at  rest, dyspnea with talking  Neck: JVD to the mandible at 30 degrees   Cardiac: Regualr rate and regular rhythm, holosystolic murmur at LLSB  Pulmonary: dyspnea with talking  Extremities: 2+ edema at his feet.  Feet are cool.      Lab Results   Component Value Date    WBC 10.97 (H) 06/30/2023    HGB 11.4 (L) 06/30/2023    HCT 37.1 (L) 06/30/2023    MCV 90.3 06/30/2023    PLT 108 (L) 06/30/2023     Lab Results   Component Value Date    CREAT 4.1 (H) 06/30/2023    BUN 62 (H) 06/30/2023    NA 135 06/30/2023    K 4.2 06/30/2023    CL 104 06/30/2023    CO2 17 06/30/2023     Lab Results   Component Value Date    ALT 53 06/29/2023    AST 62 (H) 06/29/2023    ALKPHOS 159 (H) 06/29/2023    BILITOTAL 1.7 (H) 06/29/2023     Lab Results   Component Value Date    INR 1.3 (H) 02/11/2023    INR 1.9 (H) 02/10/2023    INR 1.4 (H) 12/24/2022    PT 15.3 (H)  02/11/2023    PT 22.1 (H) 02/10/2023    PT 16.3 (H) 12/24/2022     Impression/Recommendations:  [name] [name] is a 77 y.o. 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  LVAD was not possible due to continued CKD with GFR <30.      Agree with Dr. Alda Ponder and ICU plan as of this morning a 1110a.  Dobutamine and bumex at current dose while we await PA cath.    Following the PA cath:  - q6 hours, lactate, Fick CO, CI, and SVR calculation  - Portable CXR each morning to evaluate PA cath location    For hypotension, levophed is preferred.  Avoid using phenylepherine.    Regarding advanced HF therapies, he will benefit from continued adjustment of dobutamine infusion based on PA cath CO and CI.  He is not a heart transplant candidate due to age.  He is not a LVAD candidate due to renal function.  If temporary mechanical support is considered, IABP is okay; Impella and ECMO is not recommended.    Please send updates by secure chat or call during the day.  Overnight, please call or text me as needed.    [name] Cook, MD  Advanced Heart Failure and Transplant  [facility]  [place]  Epic Secure Chat:  direct or group "[place] Advanced Heart Failure"

## 2023-06-30 NOTE — UM Notes (Signed)
PATIENT NAME: PRAVEEN, TRZCINSKI   DOB: September 18, 1946     [facility] utilization review   Please call Joselle Deeds at [phone] (confidential voicemail only), main UR # [phone] or e-mail at [email or web address] with any questions.  Fax final authorization and request for additional information to [phone]    OBS placement 06/29/23. Changed to INPT 06/30/23    DIAGNOSIS    ICD-10-CM    1. Acute on chronic congestive heart failure, unspecified heart failure type  I50.9       2. Chronic kidney disease, unspecified CKD stage  N18.9       3. Thrombocytopenia  D69.6       4. Elevated blood pressure reading  R03.0       5. Acute on chronic systolic congestive heart failure  I50.23 Cardiac Cath Case Request     Cardiac Cath Case Request     Right Heart Cath     Right Heart Cath        PRESENTED TO ED: with the complaint of ongoing shortness of breath, cough and difficulty sleeping secondary to the shortness of breath and cough.  Patient states he was seen for the same this past Saturday and despite increasing his medications, symptoms have persisted     Vitals:  Temp 97.9, HR 91, RR 25, Bp 137/94    ED Abnormal Labs:    WBC 10.44  H/H 11.4 & 36.9  Platelets 94  Glucose 248  BUN 59  Creat 3.8  AST 62  Alk Phos 159  Hs trop 67.9  proBNP 14396    Radiologic Exams:   Chest AP Portable    Result Date: 06/29/2023   No acute findings. Wynema Birch, MD 06/29/2023 5:31 PM    US Renal Kidney    Result Date: 06/26/2023   1. Polycystic kidneys. 2. Complex cystic mass of the lower pole the left kidney without internal vascularity likely representing a hemorrhagic cyst. 3 month follow-up is recommended. Fonnie Mu, DO 06/26/2023 11:09 AM    Chest AP Portable    Result Date: 06/26/2023  Stable cardiomegaly. Pankaj Dominica, MD 06/26/2023 8:46 AM        EKG: Atrial-sensed ventricular-paced rhythm   ABNORMAL ECG     ED meds:   Lasix 40 mg IV  Aspirin po    =======================Placed in OBS    Orders  NPO  AM  labs  Daily weights  Tele monitoring  VS Q4    Scheduled Meds:  Current Facility-Administered Medications   Medication Dose Route Frequency    aspirin EC  81 mg Oral Daily    atorvastatin  80 mg Oral Daily    heparin (porcine)  5,000 Units Subcutaneous Q12H SCH    hydrALAZINE  25 mg Oral Q8H SCH    insulin glargine  20 Units Subcutaneous QHS    insulin lispro  1-3 Units Subcutaneous QHS    insulin lispro  1-5 Units Subcutaneous TID AC    isosorbide dinitrate  40 mg Oral BID    pantoprazole  40 mg Oral QAM AC     Continuous Infusions:   bumetanide 1 mg/hr (06/30/23 1004)    DOBUTamine 2.5 mcg/kg/min (06/30/23 1004)     Treatment Plan per H/P  # Shortness of breath/orthopnea -suspect symptoms are multifactorial.  Do not think this is secondary to PE but likely progression of disease.  Patient with significant nonischemic cardiomyopathy with low EF along with severe MR and TR.  Ordered IV Lasix for diuresis.  Strict ins and outs and daily weights.  Also ordered antitussives for cough.  VQ scan ordered in the ED.  Can follow-up on study later today.  Also sent secure chat consult request to [facility] cardiology.     D-dimer was 0.94 which is not too far off of her will be normal given age and kidney disease.  Patient was not requiring supplemental oxygen and symptoms more likely consistent with acute on chronic CHF.  VQ scan ordered by the ED and should be done later today.  Follow-up on results.     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  Ordered heart healthy and consistent carbohydrate diet    # COPD -DuoNebs as needed    # History of CLL with thrombocytopenia- sees Dr. 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   Started on Bumex and Dobutamine IV gtts  Plan for RHC    Temp:  [96.7 F (35.9 C)-98.4 F (36.9 C)]   Heart Rate:  [68-98]   Resp Rate:  [15-30]   BP: (100-137)/(65-98)   SpO2:  [93 %-100 %]   Height:  [182.9 cm (6')]   Weight:  [77.9 kg (171 lb 11.8 oz)-90.4 kg (199 lb 4.7 oz)]   BMI (calculated):  [23.3-27]      Abn labs  WBC 10.97  H/H 11.4 & 37.1  Platelets 108  Glucose 244  BUN 62  Creat 3.7    Scheduled Meds:  Current Facility-Administered Medications   Medication Dose Route Frequency    aspirin EC  81 mg Oral Daily    atorvastatin  80 mg Oral Daily    heparin (porcine)  5,000 Units Subcutaneous Q12H SCH    insulin glargine  20 Units Subcutaneous QHS    insulin lispro  1-3 Units Subcutaneous QHS    insulin lispro  1-5 Units Subcutaneous TID AC    pantoprazole  40 mg Oral QAM AC     Continuous Infusions:   amiodarone 1 mg/min (06/30/23 1827)    Followed by    Melene Muller ON 07/01/2023] amiodarone      bumetanide 1 mg/hr (06/30/23 1004)    DOBUTamine 5 mcg/kg/min (06/30/23 1742)    nitroprusside       PRN Meds:.acetaminophen, alum & mag hydroxide-simethicone, benzonatate, dextrose **OR** dextrose **OR** dextrose **OR** glucagon (rDNA), guaiFENesin-dextromethorphan, melatonin, naloxone, ondansetron **OR** [DISCONTINUED] ondansetron, senna-docusate, traZODone    Cardiology consult  Patient is a 77 y.o. male with the following relevant pertinent problem list     SCAI B-C Cardiogenic Shock (NT proBNP at 14k)  Known Nonischemic Cardiomyopathy with last known EF 15% as of July 2024 TEE and severely dilated (6.8 cm based  on TTE versus TEE) s/p BiV ICD  Admission in Feb-March 2024 for SCAI C Cardiogenic Shock and deemed not a LVAD candidate due to worsening renal function  RHC 12/2022 while still relatively decompensated and required long course of inotropes with RA of 15, RV 48/1(14), PA 48/21 (28), Wedge of 18 (without largeV-wave), TD 2.8 L/min/m2 and CI 1.3 L/min/m2  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 He has a concerning picture with AKI, tachypnea, progressive orthopnea  Obtain Lactate Stat   Transfer to unit that can do Inotropes and start Dobutamine 2.5 mcg/kg/min and will likely titrate up to 5 pending level of ectopy  Despite his renal function, pending RHC - I suspect nitroprusside at low dose will be tolerated okay for him  Give Bumex 4 mg IV and then start 1 mg/h drip  Needs RHC with Leave-In Swan to Guide therapy in my opinion to salvage renal function and to remain a candidate for potentially MItraClip (was teetering as a candidate given EF and LV size)  Other GDMT: Stop BB, continue ISDN/Hydral for now but as above regarding possibly Nipride    Nephrology consult  Assessment:   AKI on CKD IV  Cardiorenal  CKD IV Baseline Cr 2.5-2.7  Cardiogenic shock  Severe MR  Severe functional TR  Anemia s/p duodenal endo clios 4/24  Stage IV CLL  HTn with CKD  DMII  H/O RLE DVT  Plan:   C/w bumex drip and dobutamine  Strict I/O  Will monitor electrolytes  Dose medications with eGFR <15 ml/min  Daily labs    Advanced  Heart Failure and Transplant Cardiology Note   Mr. Rabb is a 77 y.o. 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  LVAD was not possible due to continued CKD with GFR <30.       Agree with Dr. Alda Ponder and ICU plan as of this morning a 1110a.  Dobutamine and bumex at current dose while we await PA cath.     Following the PA cath:  - q6 hours, lactate, Fick CO, CI, and SVR calcination  - Portable CXR each morning to evaluate PA cath location     For hypotension, levophed is preferred.  Avoid using phenylepherine.     Regarding advanced HF therapies, he will benefit from continued adjustment of dobutamine infusion based on PA cath CO and CI.  He is not a heart transplant candidate due to age.  He is not a LVAD candidate due to renal function.  If temporary mechanical support is considered, IABP is okay; Impella and ECMO is not recommended.    Testing/procedure  RIGHT HEART CATHETERIZATION   SUMMARY  Cardiogenic shock with reduced Co/CI, elevated filling pressures, and elevated SVR  Complications: None     RECOMMENDATIONS  Disposition: Return to ICU for ongoing care  Medications: Per advanced heart failure team    Continued plan of care  - q6 hours, lactate, Fick CO,  CI, and SVR calculation  - Portable CXR each morning to evaluate PA cath location     -We need to reduce SVR to the range [phone] dynes/s/cm              -Recommend:  arterial line for BP monitoring                                       Nitroprusside infusion with goal SBP > 90 and MAP >65     -The cardiac output and input are low despite Dobutamine 2.79mcg/kg/min              -Recommend:  increase dobutamine to 87mcg/kg/min                                       Continue amiodarone for NSVT occurring earlier today     -The filling pressures remain elevated with RA 19 and PW 27              -Recommend:  continue diuresis with bumex 1mg /hr     -If hypotension (SBP<90, MAP<60) develops:               -Recommend: we will not be able to continue nitroprusside                                       If hypotension persists, levophed is preferred.  Avoid using phenylepherine.       This clinical review is based on/compiled from documentation provided by the treatment team within the patient's medical record.  ________________________________________

## 2023-07-01 ENCOUNTER — Inpatient Hospital Stay: Payer: Commercial Managed Care - POS

## 2023-07-01 ENCOUNTER — Encounter: Payer: Self-pay | Admitting: Cardiovascular Disease

## 2023-07-01 LAB — COOXIMETRY PROFILE
Carboxyhemoglobin: 0.9 % (ref 0.4–2.7)
Carboxyhemoglobin: 1 % (ref 0.4–2.7)
Carboxyhemoglobin: 1.1 % (ref 0.4–2.7)
Carboxyhemoglobin: 1.1 % (ref 0.4–2.7)
Carboxyhemoglobin: 1.2 % (ref 0.4–2.7)
Hematocrit Total Calculated: 30.5 % — ABNORMAL LOW (ref 40.0–54.0)
Hematocrit Total Calculated: 31.4 % — ABNORMAL LOW (ref 40.0–54.0)
Hematocrit Total Calculated: 31.5 % — ABNORMAL LOW (ref 40.0–54.0)
Hematocrit Total Calculated: 32.7 % — ABNORMAL LOW (ref 40.0–54.0)
Hematocrit Total Calculated: 34 % — ABNORMAL LOW (ref 40.0–54.0)
Hemoglobin Total: 10 g/dL — ABNORMAL LOW (ref 13.0–17.0)
Hemoglobin Total: 10.2 g/dL — ABNORMAL LOW (ref 13.0–17.0)
Hemoglobin Total: 10.3 g/dL — ABNORMAL LOW (ref 13.0–17.0)
Hemoglobin Total: 10.7 g/dL — ABNORMAL LOW (ref 13.0–17.0)
Hemoglobin Total: 11.1 g/dL — ABNORMAL LOW (ref 13.0–17.0)
Methemoglobin: 0.5 % (ref 0.0–1.5)
Methemoglobin: 0.6 % (ref 0.0–1.5)
Methemoglobin: 0.6 % (ref 0.0–1.5)
Methemoglobin: 0.7 % (ref 0.0–1.5)
Methemoglobin: 0.7 % (ref 0.0–1.5)
O2 Content: 13.8 %{vol} — ABNORMAL LOW (ref 18.8–22.2)
O2 Content: 9.1 %{vol} — ABNORMAL LOW (ref 18.8–22.2)
O2 Content: 8.5 %{vol} — ABNORMAL LOW (ref 18.8–22.2)
Oxygenated Hemoglobin: 41.3 % — ABNORMAL LOW (ref 94.0–98.0)
Oxygenated Hemoglobin: 45.5 % — ABNORMAL LOW (ref 94.0–98.0)
Oxygenated Hemoglobin: 58.7 % — ABNORMAL LOW (ref 94.0–98.0)
Oxygenated Hemoglobin: 63.4 % — ABNORMAL LOW (ref 94.0–98.0)
Oxygenated Hemoglobin: 97.5 % (ref 94.0–98.0)

## 2023-07-01 LAB — WHOLE BLOOD GLUCOSE POCT
Whole Blood Glucose POCT: 339 mg/dL — ABNORMAL HIGH (ref 70–100)
Whole Blood Glucose POCT: 379 mg/dL — ABNORMAL HIGH (ref 70–100)
Whole Blood Glucose POCT: 364 mg/dL — ABNORMAL HIGH (ref 70–100)
Whole Blood Glucose POCT: 302 mg/dL — ABNORMAL HIGH (ref 70–100)
Whole Blood Glucose POCT: 328 mg/dL — ABNORMAL HIGH (ref 70–100)
Whole Blood Glucose POCT: 281 mg/dL — ABNORMAL HIGH (ref 70–100)
Whole Blood Glucose POCT: 248 mg/dL — ABNORMAL HIGH (ref 70–100)
Whole Blood Glucose POCT: 199 mg/dL — ABNORMAL HIGH (ref 70–100)
Whole Blood Glucose POCT: 170 mg/dL — ABNORMAL HIGH (ref 70–100)
Whole Blood Glucose POCT: 125 mg/dL — ABNORMAL HIGH (ref 70–100)
Whole Blood Glucose POCT: 117 mg/dL — ABNORMAL HIGH (ref 70–100)
Whole Blood Glucose POCT: 98 mg/dL (ref 70–100)

## 2023-07-01 LAB — BASIC METABOLIC PANEL
Anion Gap: 12 (ref 5.0–15.0)
Anion Gap: 12 (ref 5.0–15.0)
Anion Gap: 12 (ref 5.0–15.0)
Anion Gap: 12 (ref 5.0–15.0)
BUN: 51 mg/dL — ABNORMAL HIGH (ref 9–28)
BUN: 52 mg/dL — ABNORMAL HIGH (ref 9–28)
BUN: 54 mg/dL — ABNORMAL HIGH (ref 9–28)
BUN: 57 mg/dL — ABNORMAL HIGH (ref 9–28)
CO2: 18 meq/L (ref 17–29)
CO2: 19 meq/L (ref 17–29)
CO2: 20 meq/L (ref 17–29)
CO2: 21 meq/L (ref 17–29)
Calcium: 8.3 mg/dL (ref 7.9–10.2)
Calcium: 8.6 mg/dL (ref 7.9–10.2)
Calcium: 8.7 mg/dL (ref 7.9–10.2)
Calcium: 9.2 mg/dL (ref 7.9–10.2)
Chloride: 103 meq/L (ref 99–111)
Chloride: 106 meq/L (ref 99–111)
Chloride: 107 meq/L (ref 99–111)
Chloride: 98 meq/L — ABNORMAL LOW (ref 99–111)
Creatinine: 3.4 mg/dL — ABNORMAL HIGH (ref 0.5–1.5)
Creatinine: 3.5 mg/dL — ABNORMAL HIGH (ref 0.5–1.5)
Creatinine: 3.5 mg/dL — ABNORMAL HIGH (ref 0.5–1.5)
Creatinine: 3.6 mg/dL — ABNORMAL HIGH (ref 0.5–1.5)
GFR: 16.8 mL/min/{1.73_m2} — ABNORMAL LOW (ref >=60.0)
GFR: 17.3 mL/min/{1.73_m2} — ABNORMAL LOW (ref >=60.0)
GFR: 17.3 mL/min/{1.73_m2} — ABNORMAL LOW (ref >=60.0)
GFR: 18 mL/min/{1.73_m2} — ABNORMAL LOW (ref >=60.0)
Glucose: 266 mg/dL — ABNORMAL HIGH (ref 70–100)
Glucose: 316 mg/dL — ABNORMAL HIGH (ref 70–100)
Glucose: 406 mg/dL — ABNORMAL HIGH (ref 70–100)
Glucose: 582 mg/dL (ref 70–100)
Potassium: 3.6 meq/L (ref 3.5–5.3)
Potassium: 3.7 meq/L (ref 3.5–5.3)
Potassium: 3.8 meq/L (ref 3.5–5.3)
Potassium: 3.8 meq/L (ref 3.5–5.3)
Sodium: 131 meq/L — ABNORMAL LOW (ref 135–145)
Sodium: 133 meq/L — ABNORMAL LOW (ref 135–145)
Sodium: 138 meq/L (ref 135–145)
Sodium: 138 meq/L (ref 135–145)

## 2023-07-01 LAB — CBC
Absolute nRBC: 0.05 10*3/uL — ABNORMAL HIGH (ref <=0.00)
Hematocrit: 32.2 % — ABNORMAL LOW (ref 37.6–49.6)
Hemoglobin: 10.1 g/dL — ABNORMAL LOW (ref 12.5–17.1)
MCH: 27.4 pg (ref 25.1–33.5)
MCHC: 31.4 g/dL — ABNORMAL LOW (ref 31.5–35.8)
MCV: 87.5 fL (ref 78.0–96.0)
Platelet Count: 82 10*3/uL — ABNORMAL LOW (ref 142–346)
RBC: 3.68 10*6/uL — ABNORMAL LOW (ref 4.20–5.90)
RDW: 19 % — ABNORMAL HIGH (ref 11–15)
WBC: 7.83 10*3/uL (ref 3.10–9.50)
nRBC %: 0.6 /100{WBCs} — ABNORMAL HIGH (ref <=0.0)

## 2023-07-01 LAB — LACTIC ACID
Whole Blood Lactic Acid: 1.5 mmol/L (ref 0.2–2.0)
Whole Blood Lactic Acid: 1.4 mmol/L (ref 0.2–2.0)
Whole Blood Lactic Acid: 2.9 mmol/L — ABNORMAL HIGH (ref 0.2–2.0)

## 2023-07-01 LAB — VENOUS BLOOD GAS
O2 Liter Flow: 3 L/min
Temperature: 37 C
Venous Base Excess: -1.7 meq/L (ref <=5.3)
Venous HCO3: 23.3 meq/L — ABNORMAL LOW (ref 23.8–32.4)
Venous O2 Saturation: 60.1 % (ref 18.6–94.8)
Venous PCO2: 39.6 mmHg (ref 39.0–63.0)
Venous PO2: 35.1 mmHg (ref 17.0–59.0)
Venous Total CO2: 24.6 meq/L — ABNORMAL LOW (ref 25.8–34.1)
Venous pH: 7.379 (ref 7.310–7.410)

## 2023-07-01 LAB — MAGNESIUM
Magnesium: 2.4 mg/dL (ref 1.6–2.6)
Magnesium: 2.4 mg/dL (ref 1.6–2.6)
Magnesium: 2.5 mg/dL (ref 1.6–2.6)
Magnesium: 2.7 mg/dL — ABNORMAL HIGH (ref 1.6–2.6)

## 2023-07-01 LAB — HEMOGLOBIN AND HEMATOCRIT
Hematocrit: 30.7 % — ABNORMAL LOW (ref 37.6–49.6)
Hematocrit: 31.2 % — ABNORMAL LOW (ref 37.6–49.6)
Hemoglobin: 9.5 g/dL — ABNORMAL LOW (ref 12.5–17.1)
Hemoglobin: 9.9 g/dL — ABNORMAL LOW (ref 12.5–17.1)

## 2023-07-01 LAB — PHOSPHORUS: Phosphorus: 4 mg/dL (ref 2.3–4.7)

## 2023-07-01 MED ORDER — MAGNESIUM SULFATE IN D5W 1-5 GM/100ML-% IV SOLN
1.0000 g | Freq: Once | INTRAVENOUS | Status: AC
Start: 2023-07-01 — End: 2023-07-01
  Administered 2023-07-01: 1 g via INTRAVENOUS
  Filled 2023-07-01: qty 100

## 2023-07-01 MED ORDER — INSULIN LISPRO 100 UNIT/ML SOLN (WRAP)
2.0000 [IU] | Freq: Three times a day (TID) | Status: DC
Start: 2023-07-01 — End: 2023-07-01
  Administered 2023-07-01: 10 [IU] via SUBCUTANEOUS
  Filled 2023-07-01: qty 18

## 2023-07-01 MED ORDER — POTASSIUM CHLORIDE CRYS ER 20 MEQ PO TBCR
20.0000 meq | EXTENDED_RELEASE_TABLET | Freq: Once | ORAL | Status: AC
Start: 2023-07-01 — End: 2023-07-01
  Administered 2023-07-01: 20 meq via ORAL
  Filled 2023-07-01: qty 1

## 2023-07-01 MED ORDER — DEXTROSE 50 % IV SOLN
12.5000 g | INTRAVENOUS | Status: DC | PRN
Start: 2023-07-01 — End: 2023-07-02

## 2023-07-01 MED ORDER — INSULIN GLARGINE 100 UNIT/ML SC SOLN
20.0000 [IU] | Freq: Every morning | SUBCUTANEOUS | Status: DC
Start: 2023-07-01 — End: 2023-07-01
  Administered 2023-07-01: 20 [IU] via SUBCUTANEOUS
  Filled 2023-07-01: qty 20

## 2023-07-01 MED ORDER — INSULIN REGULAR 100 UNITS IN 100 ML NS (PREMIX)
0.0000 [IU]/h | INTRAVENOUS | Status: DC
Start: 2023-07-01 — End: 2023-07-02
  Administered 2023-07-01: 5 [IU]/h via INTRAVENOUS
  Filled 2023-07-01: qty 100

## 2023-07-01 MED ORDER — INSULIN LISPRO 100 UNIT/ML SOLN (WRAP)
1.0000 [IU] | Freq: Every evening | Status: DC
Start: 2023-07-01 — End: 2023-07-01

## 2023-07-01 MED ORDER — DEXTROSE 10 % IV BOLUS
12.5000 g | INTRAVENOUS | Status: DC | PRN
Start: 2023-07-01 — End: 2023-07-02

## 2023-07-01 NOTE — Plan of Care (Signed)
Problem: Everyday - Heart Failure  Goal: Stable Vital Signs and Fluid Balance  Outcome: Progressing  Flowsheets (Taken 07/01/2023 2045)  Stable Vital Signs and Fluid Balance:   Daily Standing Weights in the morning using the same scale, after using the bathroom and before breadfast.  If unable to stand, zero the bed and use the bed scale   Monitor, assess vital signs and telemetry per policy   Monitor labs and report abnormalities to physician   Strict Intake/Output   Fluid Restriction   Assess for swelling/edema   Wean oxygen as needed if appropriate  Goal: Mobility/Activity is Maintained at Optimal Level for Patient  Outcome: Progressing  Flowsheets (Taken 07/01/2023 2045)  Mobility/Activity is Maintained at Optimal Level for Patient:   Increase mobility as tolerated/progressive mobility protocol   Maintain SCD's as Ordered   Reposition patient every 2 hours and as needed unless able to reposition self   Assess for changes in respiratory status, level of consciousness and/or development of fatigue  Goal: Nutritional Intake is Adequate  Outcome: Progressing  Flowsheets (Taken 07/01/2023 2045)  Nutritional Intake is Adequate:   Cardiac diet-2 gm Sodium   Fluid Restricction if needed   Consult/Collaborate with Nutritionist   Patient and family teaching on low sodium diet   Assess appetite,anorexia and amount of meal/food tolerated   Encourage/perform oral hygiene as appropriate     Problem: Hemodynamic Status: Cardiac  Goal: Stable vital signs and fluid balance  Outcome: Progressing  Flowsheets (Taken 07/01/2023 2045)  Stable vital signs and fluid balance:   Assess signs and symptoms associated with cardiac rhythm changes   Monitor lab values     Problem: Inadequate Tissue Perfusion  Goal: Adequate tissue perfusion will be maintained  Outcome: Progressing  Flowsheets (Taken 07/01/2023 2045)  Adequate tissue perfusion will be maintained:   Monitor/assess lab values and report abnormal values   Monitor/assess  neurovascular status (pulses, capillary refill, pain, paresthesia, paralysis, presence of edema)   Monitor/assess for signs of VTE (edema of calf/thigh redness, pain)   Monitor for signs and symptoms of a pulmonary embolism (dyspnea, tachypnea, tachycardia, confusion)   Encourage/assist patient as needed to turn, cough, and perform deep breathing every 2 hours

## 2023-07-01 NOTE — Progress Note - Problem Oriented Charting Notewrit (Signed)
PROGRESS NOTE      Date Time: 07/01/23 12:45 PM  Patient Name: [name] [name], [name] [name]    Chief Complaint:   ARF. CKD 3b.    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  Appeared to be in decompensated CHF and started on dobutamine drips, nitroprusside drips.  Responded well.  Will follow the patient in the office therefore we have been asked to follow the patient in the hospital.    Still short of breath.  Denies chest pain.  Denies abdominal pain.  No fluid retention lower extremities.    Medications:   Scheduled Meds:  Current Facility-Administered Medications   Medication Dose Route Frequency    atorvastatin  80 mg Oral Daily    magnesium sulfate  1 g Intravenous Once    pantoprazole  40 mg Oral QAM AC     Continuous Infusions:   amiodarone 0.5 mg/min (07/01/23 1107)    bumetanide 1 mg/hr (07/01/23 0433)    DOBUTamine 5 mcg/kg/min (07/01/23 1232)    insulin regular      nitroprusside 1 mcg/kg/min (07/01/23 1233)     PRN Meds:.acetaminophen, alum & mag hydroxide-simethicone, benzonatate, dextrose **OR** dextrose **OR** dextrose **OR** glucagon (rDNA), dextrose **OR** dextrose, guaiFENesin-dextromethorphan, melatonin, naloxone, ondansetron **OR** [DISCONTINUED] ondansetron, senna-docusate, traZODone    Review of Systems:   Review of systems was: Negative except -shortness of breath.    Physical Exam:   Blood pressure 118/84, pulse 90, temperature 97.1 F (36.2 C), temperature source Temporal, resp. rate 18, height 1.829 m (6' 0.01"), weight 90.4 kg (199 lb 4.7 oz), SpO2 98%.     Intake and Output Summary (Last 24 hours) at Date Time:   Intake/Output Summary (Last 24 hours) at 07/01/2023 1245  Last data filed at 07/01/2023 1208  Gross per 24 hour   Intake 2180.1 ml   Output 5910 ml   Net -3729.9 ml     Intake and Output Summary for  yesterday: 09/11 0701 - 09/12 0700  In: 1764.06 [P.O.:1200; I.V.:564.06]  Out: 6110 [Urine:6100]     Mental status - alert, oriented to person, place, and time  Eyes - pupils equal and reactive, extraocular eye movements intact  Ears -  external ear canals normal  Nose - normal and patent, no erythema, discharge or polyps  Mouth - mucous membranes moist, pharynx normal without lesions  Neck - supple, no significant adenopathy, no JVD  Lymphatics - no palpable lymphadenopathy, no hepatosplenomegaly  Chest - clear to auscultation, no wheezes, rales or rhonchi, symmetric air entry  Heart - normal rate, regular rhythm, normal S1, S2, no murmurs, rubs, clicks or gallops  Abdomen - soft, nontender, nondistended, no masses or organomegaly, bowel sounds present. NO bruits over the renal arteries.  Neurological - awake, alert, oriented x3, normal speech, no focal findings or movement disorder noted  Musculoskeletal - no joint tenderness, deformity or swelling  Extremities - peripheral pulses normal, no pedal edema, no clubbing or cyanosis.  Skin - normal coloration and turgor, no rashes, no suspicious skin lesions noted    Labs:     Results       Procedure Component Value Units Date/Time    Basic Metabolic Panel [[id number]]  (Abnormal) Collected: 07/01/23 1151    Specimen: Blood, Venous Updated: 07/01/23 1227     Glucose 582 mg/dL      BUN 51  mg/dL      Creatinine 3.5 mg/dL      Calcium 8.3 mg/dL      Sodium 540 mEq/L      Potassium 3.7 mEq/L      Chloride 98 mEq/L      CO2 21 mEq/L      Anion Gap 12.0     GFR 17.3 mL/min/1.73 m2     Magnesium [[id number]]  (Normal) Collected: 07/01/23 1151    Specimen: Blood, Venous Updated: 07/01/23 1222     Magnesium 2.4 mg/dL     Venous Blood Gas [[id number]]  (Abnormal) Collected: 07/01/23 1205    Specimen: Venous Blood from Blood, Venous Updated: 07/01/23 1205     Venous pH 7.379     Venous PCO2 39.6 mmHg      Venous PO2 35.1 mmHg      Venous HCO3 23.3 mEq/L      Venous Total CO2 24.6  mEq/L      Venous Base Excess -1.7 mEq/L      Venous O2 Saturation 60.1 %      Temperature 37.0 C      Collection Site Venous     O2 Delivery Nasal Cannula     O2 Liter Flow 3.0 L/min      Status Oxygen    Lactic Acid [[id number]]  (Normal) Collected: 07/01/23 1151    Specimen: Blood, Venous Updated: 07/01/23 1200     Whole Blood Lactic Acid 1.4 mmol/L     Hemoglobin and Hematocrit [[id number]]  (Abnormal) Collected: 07/01/23 1151    Specimen: Blood, Venous Updated: 07/01/23 1159     Hemoglobin 9.5 g/dL      Hematocrit 40.1 %     Whole Blood Glucose POCT [[id number]]  (Abnormal) Collected: 07/01/23 1142    Specimen: Blood, Capillary Updated: 07/01/23 1147     Whole Blood Glucose POCT 379 mg/dL     Whole Blood Glucose POCT [[id number]]  (Abnormal) Collected: 07/01/23 0724    Specimen: Blood, Capillary Updated: 07/01/23 0726     Whole Blood Glucose POCT 339 mg/dL     Magnesium [[id number]]  (Normal) Collected: 07/01/23 0532    Specimen: Blood, Venous Updated: 07/01/23 0615     Magnesium 2.4 mg/dL     Phosphorus [[id number]]  (Normal) Collected: 07/01/23 0532    Specimen: Blood, Venous Updated: 07/01/23 0615     Phosphorus 4.0 mg/dL     Basic Metabolic Panel [[id number]]  (Abnormal) Collected: 07/01/23 0532    Specimen: Blood, Venous Updated: 07/01/23 0615     Glucose 406 mg/dL      BUN 54 mg/dL      Creatinine 3.5 mg/dL      Calcium 8.7 mg/dL      Sodium 109 mEq/L      Potassium 3.8 mEq/L      Chloride 103 mEq/L      CO2 18 mEq/L      Anion Gap 12.0     GFR 17.3 mL/min/1.73 m2     Co-Oximetry Profile [[id number]]  (Abnormal) Collected: 07/01/23 0603    Specimen: Blood, Venous Updated: 07/01/23 0605     Hematocrit Total Calculated 32.7 %      Hemoglobin Total 10.7 g/dL      Carboxyhemoglobin 1.0 %      Oxygenated Hemoglobin 41.3 %      Methemoglobin 0.7 %      Collection Site Not Specif    CBC without Differential [[id number]]  (Abnormal) Collected: 07/01/23 0538    Specimen: Blood, Venous  Updated: 07/01/23 0557     WBC 7.83  x10 3/uL      Hemoglobin 10.1 g/dL      Hematocrit 38.7 %      Platelet Count 82 x10 3/uL      MPV --     RBC 3.68 x10 6/uL      MCV 87.5 fL      MCH 27.4 pg      MCHC 31.4 g/dL      RDW 19 %      nRBC % 0.6 /100 WBC      Absolute nRBC 0.05 x10 3/uL     Lactic Acid [[id number]]  (Normal) Collected: 07/01/23 0532    Specimen: Blood, Venous Updated: 07/01/23 0543     Whole Blood Lactic Acid 1.5 mmol/L     Cyanide Level [[id number]] Collected: 07/01/23 0532    Specimen: Blood, Venous Updated: 07/01/23 0538    Co-Oximetry Profile [[id number]]  (Abnormal) Collected: 07/01/23 0004    Specimen: Blood, Venous Updated: 07/01/23 0007     Hematocrit Total Calculated 34.0 %      Hemoglobin Total 11.1 g/dL      Carboxyhemoglobin 1.1 %      Oxygenated Hemoglobin 45.5 %      Methemoglobin 0.6 %      Collection Site Other    Basic Metabolic Panel [[id number]]  (Abnormal) Collected: 06/30/23 2335    Specimen: Blood, Venous Updated: 07/01/23 0006     Glucose 316 mg/dL      BUN 57 mg/dL      Creatinine 3.6 mg/dL      Calcium 9.2 mg/dL      Sodium 542 mEq/L      Potassium 3.8 mEq/L      Chloride 106 mEq/L      CO2 20 mEq/L      Anion Gap 12.0     GFR 16.8 mL/min/1.73 m2     Magnesium [[id number]]  (Normal) Collected: 06/30/23 2335    Specimen: Blood, Venous Updated: 07/01/23 0006     Magnesium 2.5 mg/dL     Hemoglobin and Hematocrit [[id number]]  (Abnormal) Collected: 06/30/23 2335    Specimen: Blood, Venous Updated: 06/30/23 2350     Hemoglobin 10.6 g/dL      Hematocrit 73.7 %     Lactic Acid [[id number]]  (Normal) Collected: 06/30/23 2335    Specimen: Blood, Venous Updated: 06/30/23 2349     Whole Blood Lactic Acid 1.1 mmol/L     Whole Blood Glucose POCT [[id number]]  (Abnormal) Collected: 06/30/23 2049    Specimen: Blood, Capillary Updated: 06/30/23 2051     Whole Blood Glucose POCT 195 mg/dL     Magnesium [[id number]]  (Abnormal) Collected: 06/30/23 1839    Specimen: Blood, Venous Updated: 06/30/23 1912     Magnesium 2.7 mg/dL     Basic  Metabolic Panel [[id number]]  (Abnormal) Collected: 06/30/23 1839    Specimen: Blood, Venous Updated: 06/30/23 1912     Glucose 210 mg/dL      BUN 59 mg/dL      Creatinine 3.5 mg/dL      Calcium 9.2 mg/dL      Sodium 810 mEq/L      Potassium 3.5 mEq/L      Chloride 106 mEq/L      CO2 22 mEq/L      Anion Gap 13.0     GFR 17.3 mL/min/1.73 m2     Lactic Acid [[id number]]  (Normal) Collected: 06/30/23 1839    Specimen: Blood,  Venous Updated: 06/30/23 1853     Whole Blood Lactic Acid 1.1 mmol/L     Whole Blood Glucose POCT [[id number]]  (Abnormal) Collected: 06/30/23 1545    Specimen: Blood, Capillary Updated: 06/30/23 1550     Whole Blood Glucose POCT 205 mg/dL     Magnesium [[id number]]  (Normal) Collected: 06/30/23 1300    Specimen: Blood, Venous Updated: 06/30/23 1338     Magnesium 2.3 mg/dL     Basic Metabolic Panel [[id number]]  (Abnormal) Collected: 06/30/23 1300    Specimen: Blood, Venous Updated: 06/30/23 1338     Glucose 244 mg/dL      BUN 62 mg/dL      Creatinine 3.7 mg/dL      Calcium 9.1 mg/dL      Sodium 846 mEq/L      Potassium 3.8 mEq/L      Chloride 107 mEq/L      CO2 19 mEq/L      Anion Gap 12.0     GFR 16.2 mL/min/1.73 m2     Lactic Acid [[id number]]  (Normal) Collected: 06/30/23 1258    Specimen: Blood, Venous Updated: 06/30/23 1313     Whole Blood Lactic Acid 1.5 mmol/L     Calcium, Ionized [[id number]]  (Normal) Collected: 06/30/23 1258    Specimen: Blood, Venous Updated: 06/30/23 1312     Calcium, Ionized 2.29 mEq/L             Labs (last 72 hours):  Recent Labs     07/01/23  1151 07/01/23  0538 06/30/23  2335 06/30/23  0535 06/29/23  1703   WBC  --  7.83  --  10.97* 10.44*   Hemoglobin 9.5* 10.1* 10.6* 11.4* 11.4*   Hematocrit 30.7* 32.2* 33.7* 37.1* 36.9*     Recent Labs     06/29/23  1914   PTT 27    Recent Labs     07/01/23  1151 07/01/23  0532 06/30/23  2335   Sodium 131* 133* 138   Potassium 3.7 3.8 3.8   Chloride 98* 103 106   CO2 21 18 20    BUN 51* 54* 57*   Creatinine 3.5* 3.5* 3.6*   Glucose  582* 406* 316*   Calcium 8.3 8.7 9.2   Magnesium 2.4 2.4 2.5   Phosphorus  --  4.0  --                    Rads:     Radiology Results (24 Hour)       Procedure Component Value Units Date/Time    X-ray chest AP portable [[id number]] Collected: 07/01/23 0746    Order Status: Completed Updated: 07/01/23 0749    Narrative:      HISTORY: PA catheter placement.      COMPARISON: Chest radiograph 06/30/2023.    FINDINGS:   A right IJ Swan-Ganz catheter tip overlies the proximal right pulmonary  artery, unchanged. Left-sided AICD is seen. Mild bibasilar atelectasis is  noted. There is no pneumothorax or pleural effusion. Cardiomediastinal  silhouette is unchanged.      Impression:        Right IJ Swan-Ganz catheter tip overlies the right pulmonary artery.    Nonda Lou, MD  07/01/2023 7:47 AM    XR Chest AP Portable [[id number]] Collected: 06/30/23 1841    Order Status: Completed Updated: 06/30/23 1845    Narrative:      HISTORY: Arrhythmia. Pulmonary artery catheter placement.     COMPARISON: CXR 10 September, 2024  FINDINGS:   Cardiac silhouette enlargement appears increased, and mild edema has  developed. The lungs are hypoinflated. The pulmonary artery catheter tip  overlies the proximal right pulmonary artery. The defibrillator remains. No  pleural effusion or pneumothorax is evident. The left apex is excluded.      Impression:          1. Pulmonary artery catheter tip at the proximal right pulmonary artery.  2. CHF.    Nelta Numbers, MD  06/30/2023 6:43 PM              Assessment:   ARF - due to cardiorenal syndrome.  In spite of significant diuresis Weg goes up.  The kidney function remains stable having current diuresis of 250 cc/h.  Continue Bumex drips at 1 mg/h.  CKD 3B - the baseline creatinine level is 1.7 in April of this year.  Not sure if the kidney function can return back to the baseline.  CHF - the cardiac function is declining.  The medical management is optimized already.  It seems that prognosis is  guarded.  Hyponatremia - due to volume overload.  Continue current diuresis.  Diabetes type 2 - on sliding scale insulin.    Plan:   Bumex drip at 1 mg/h  Amiodarone drips  Dobutamine drips  Nitroprusside drips  CBC, BMP daily.  Prognosis is guarded.    Time spent for evaluation, management and coordination of care:  40 minutes      Signed by: Toula Moos, MD, MD  [phone], office  [phone], pager

## 2023-07-01 NOTE — Progress Notes (Signed)
07/01/23 0600   VO2 Determination   Height 1.829 m (6' 0.01")   Weight FICK (only) 90.4 kg (199 lb 4.7 oz)   BSA (Fick only) (Calculated - sq m) 2.14 sq meters   VO2 267.5   CO/CI Determination   SaO2  1   SvO2  0.41   SaO2 - SvO2 0.59   HGB Result 10.1   x 1.36 x 10 81.04   Cardiac Output (FICK) 3.3   Cardiac Index (FICK) 1.54   Cardiac Power Output 0.65   PVR Determination   PAP 60/29   PAP (Mean) 40 mmHg   Pulmonary Artery Pulsatility Index (PAPI) 1.19   SVR Determination   MAP (mmHg) 89   CVP (mmHg) 26 mmHg   MAP - CVP 63   SVR (dynes/second/cm3) 1527.27 dynes/second/cm3     Hemodynamics values relayed to Dr. Audley Hose, Nipride increase to 2 mcg/kg/min.

## 2023-07-01 NOTE — Progress Notes (Addendum)
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 Webb Silversmith as well as [facility] general cardiologist evaluation recommendation.  Increase dobutamine drip to 5 mcg/kg/min.  Not a candidate for advanced heart failure therapy with LVAD given comorbidities.  Ventricular ectopy, continue of amiodarone infusion  Continue with nipride infusion to decrease afterload    Coronary artery disease  History of hypertension, currently not tolerating goal-directed medical therapy in the setting of acute decompensated heart failure.    Nonischemic cardiomyopathy, ejection fraction 15%, biventricular AICD  Severe functional tricuspid regurgitation, severe mitral regurgitation, when patient is compensated he may reconsider mitral clip as an outpatient    Acute on chronic systolic congestive heart failure, continue with Bumex infusion  Swan-Ganz catheter guided therapy    D/c aspirin for low platelet    #Resp: Dyspnea related to congestive heart failure.  Improved with diuresis.    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  Appreciate [facility] evaluation recommendation.  Lactic acidosis, improved with dobutamine infusion    #Infectious Disease (ID): No indication of infection at this time, will closely monitor.  Afebrile, no leukocytosis           #Hem/Onc: Anemia of chronic disease  Remote history of CLL    #Endocrine: Diabetes mellitus,  additional Lantus and increase adding scale insulin to high-dose    Addendum:  difficult to control despite additional Lantus and sliding scale insulin.  Order placed for insulin drip      #Prophylaxis:   VTE Prophylaxis:hold for low platelet      #Code Status: full code        Subjective:     Alert, interactive, no chest pain, he has improvement in shortness of breath    Medications:   Scheduled Meds:  Current Facility-Administered Medications   Medication Dose Route Frequency    aspirin EC  81 mg Oral Daily    atorvastatin  80 mg Oral Daily    heparin (porcine)  5,000 Units Subcutaneous Q12H SCH    insulin glargine  20 Units Subcutaneous QHS    insulin glargine  20 Units Subcutaneous QAM    insulin lispro  1-6 Units Subcutaneous QHS    insulin lispro  2-10 Units Subcutaneous TID AC    pantoprazole  40 mg Oral QAM AC         Continuous Infusions:   amiodarone 0.5 mg/min (07/01/23 1107)    bumetanide 1 mg/hr (07/01/23 0433)    DOBUTamine 5 mcg/kg/min (07/01/23 0843)    nitroprusside 0.7 mcg/kg/min (07/01/23 1013)        Physical Exam:     Vitals:    07/01/23 1000   BP:    Pulse: 90   Resp: 18   Temp: 97.1 F (36.2 C)   SpO2: 98%  Intake/Output Summary (Last 24 hours) at 07/01/2023 1111  Last data filed at 07/01/2023 1013  Gross per 24 hour   Intake 2109.1 ml   Output 5860 ml   Net -3750.9 ml       General Appearance: Younger than stated age, very pleasant, alert and interactive and not confused, Swan-Ganz catheter in the right neck is intact and functioning  CV: ns1, ns2, 2/6 systolic ejection murmur in the right sternal border, no rubs no gallop  Pulm: Mild bibasilar crackles  Abd: Nontender, nondistended, positive bowel sounds  Extremities: Warm lower extremities, +2 dorsalis pedis pulses, +2 pitting edema    Data:   Invasive ICU Hemodynamics:    Invasive Hemodynamic Monitoring  Hemodynamic Monitoring Additional Assessment: Yes  CVP (mmHg): 15 mmHg  PAP: 59/25  PAP (Mean): 37 mmHg  Cardiac Power Output:  0.59  Pulmonary Artery Pulsatility Index (PAPI): 2.27    Arterial Line  Arterial Line BP: 118/61  Arterial Line MAP (mmHg): 80 mmHg  Arterial Line Location: Left radial  Arterial Line Wave Form: Appropriate wave forms    Labs:   Labs (last 72 hours):  Recent Labs     07/01/23  0538 06/30/23  2335 06/30/23  0535 06/29/23  1703   WBC 7.83  --  10.97* 10.44*   Hemoglobin 10.1* 10.6* 11.4* 11.4*   Hematocrit 32.2* 33.7* 37.1* 36.9*     Recent Labs     06/29/23  1914   PTT 27    Recent Labs     07/01/23  0532 06/30/23  2335 06/30/23  1839   Sodium 133* 138 141   Potassium 3.8 3.8 3.5   Chloride 103 106 106   CO2 18 20 22    BUN 54* 57* 59*   Creatinine 3.5* 3.6* 3.5*   Glucose 406* 316* 210*   Calcium 8.7 9.2 9.2   Magnesium 2.4 2.5 2.7*   Phosphorus 4.0  --   --                Rads:   Radiological Imaging personally reviewed,and agree with radiology report including:     X-ray chest AP portable    Result Date: 07/01/2023  Right IJ Swan-Ganz catheter tip overlies the right pulmonary artery. Nonda Lou, MD 07/01/2023 7:47 AM    XR Chest AP Portable    Result Date: 06/30/2023  1. Pulmonary artery catheter tip at the proximal right pulmonary artery. 2. CHF. Nelta Numbers, MD 06/30/2023 6:43 PM    Chest AP Portable    Result Date: 06/29/2023   No acute findings. Wynema Birch, MD 06/29/2023 5:31 PM    US Renal Kidney    Result Date: 06/26/2023   1. Polycystic kidneys. 2. Complex cystic mass of the lower pole the left kidney without internal vascularity likely representing a hemorrhagic cyst. 3 month follow-up is recommended. Fonnie Mu, DO 06/26/2023 11:09 AM    Chest AP Portable    Result Date: 06/26/2023  Stable cardiomegaly. Pankaj Dominica, MD 06/26/2023 8:46 AM      Critical care provider statement:   Critical care time (minutes):  75   Critical care was necessary to treat or prevent imminent or life-threatening deterioration of the following conditions: Acute on chronic decompensated systolic congestive heart failure,  cardiogenic shock, nonischemic cardiomyopathy, acute kidney injury, lactic acidosis     Critical care was time spent personally by me on the following activities:  Development of treatment plan with patient or surrogate, discussions with consultants, examination of  patient, re-evaluation of patient's condition, pulse oximetry, ordering and review of radiographic studies, ordering and review of laboratory studies and ordering and performing treatments and interventions       Signed by: Lawernce Ion, MD, MD  Date/Time: 07/01/23 11:11 AM           *This note was generated by the Epic EMR system/ Dragon speech recognition and may contain inherent errors or omissions not intended by the user. Grammatical errors, random word insertions, deletions, pronoun errors and incomplete sentences are occasional consequences of this technology due to software limitations. Not all errors are caught or corrected. If there are questions or concerns about the content of this note or information contained within the body of this dictation they should be addressed directly with the author for clarification

## 2023-07-01 NOTE — Consults (Addendum)
Advanced Heart Failure and Transplant Cardiology Note    [name] is a very pleasant 77 y.o. 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  He has been more active at home for the last 6 months.  MV clip has been considered.      He was admitted 06/30/2023 with worsening HF symptoms to include weight gain, LE edema, and DOE.  He was found to be in SCAI B-C shock and transferred to the ICU.  He was given bumex IV then infusion.  Dobutamine 2.57mcg/kg/min was started.    PA cath demonstrated RA 19, PA 42/23 (33), PW 27, Fick CI 1.59, TD CI 0.9.    Overnight:  Dobutamine 2.5-->5 then reduced to 2.5 due to worsening ventricular ectopy  I/O in last 24 hours net -4.3L  Cr 4.1-->3.5  Lactic acid 1.5  At midnight:  CO 3.1, CI 1.47, SVR 1401 on dobutamine 2.5 and nitroprusside  At 6am:  CO 3.3, CI 1.54, SVR 1527 on dobutamine 2.5 and nitroprusside    Scheduled Meds:  Current Facility-Administered Medications   Medication Dose Route Frequency    aspirin EC  81 mg Oral Daily    atorvastatin  80 mg Oral Daily    heparin (porcine)  5,000 Units Subcutaneous Q12H SCH    insulin glargine  20 Units Subcutaneous QHS    insulin lispro  1-3 Units Subcutaneous QHS    insulin lispro  1-5 Units Subcutaneous TID AC    pantoprazole  40 mg Oral QAM AC     Continuous Infusions:   amiodarone 0.5 mg/min (07/01/23 0032)    bumetanide 1 mg/hr (07/01/23 0433)    DOBUTamine 2.5 mcg/kg/min (06/30/23 2146)    nitroprusside 0.2 mcg/kg/min (07/01/23 0615)     PRN Meds:.acetaminophen, alum & mag hydroxide-simethicone, benzonatate, dextrose **OR** dextrose **OR** dextrose **OR** glucagon (rDNA), guaiFENesin-dextromethorphan, melatonin, naloxone, ondansetron **OR** [DISCONTINUED] ondansetron, senna-docusate, traZODone    PE:  BP 120/83   Pulse 83   Temp 97.8  F (36.6 C) (Temporal)   Resp (!) 23   Ht 1.829 m (6' 0.01")   Wt 90.4 kg (199 lb 4.7 oz)   SpO2 99%   BMI 27.02 kg/m     Constitutional: Cooperative, alert - tachypneic at rest has resolved.  Some dyspnea with talking.  Neck: JVD mildly elevated on the left, IJ cath in right  Cardiac: Regualr rate and regular rhythm, holosystolic murmur at LLSB  Pulmonary: dyspnea with talking, lungs with mild crackles in the lateral fields  Extremities: 2+ edema at his feet.  Feet are cool.    Lab Results   Component Value Date    WBC 7.83 07/01/2023    HGB 10.1 (L) 07/01/2023    HCT 32.2 (L) 07/01/2023    MCV 87.5 07/01/2023    PLT 82 (L) 07/01/2023     Lab Results   Component Value Date    CREAT 3.5 (H) 07/01/2023    BUN 54 (H) 07/01/2023    NA 133 (L) 07/01/2023    K 3.8 07/01/2023    CL 103 07/01/2023    CO2 18 07/01/2023     Lab Results   Component Value Date    ALT 53 06/29/2023    AST 62 (H) 06/29/2023    ALKPHOS 159 (  H) 06/29/2023    BILITOTAL 1.7 (H) 06/29/2023     Lab Results   Component Value Date    INR 1.3 (H) 02/11/2023    INR 1.9 (H) 02/10/2023    INR 1.4 (H) 12/24/2022    PT 15.3 (H) 02/11/2023    PT 22.1 (H) 02/10/2023    PT 16.3 (H) 12/24/2022     CXR (this morning on left, yesterday on right) with improvement of pulmonary edema        Impression:  [name] [name] is a 77 y.o. 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  LVAD was not possible due to continued CKD with GFR <30.      He has had modest improvement since interventions on 9/11 and overnight.  He appears better and is not tachypneic at rest.  His chest x-ray demonstrates improvement in pulmonary edema.    During exam today his wedge pressure was 17, down from 27 yesterday.  His CVP remains elevated at 15, down from 19 yesterday.    Last night dobutamine was increased to 5 but was later reduced back to 2.5 due to ongoing ventricular arrhythmias (NSVT and frequent PVCs).      Today we will continue  to optimize his hemodynamics.  Will increase dobutamine back to 5 now that he has less ectopy and has had more time with amiodarone.  Diuresis will continue.  Regarding temporary mechanical support, I am not sure there is an option for additional therapy after this hospitalization.  Specifically, and mitral valve clip has been considered.  With severe LV dysfunction a mitral valve clip will overload the pressure in the left ventricle and make it fail further.  Therefore placing a intra-aortic balloon pump will only be temporizing very critical cardiac status that has a low risk of survival.  I explained to [name] [name] there are goal here is to optimize everything is much as possible with hopes that he may leave the hospital.  I also discussed the critical nature of his cardiac status and that he may not make it out of the hospital at this time.    Recommendations:  - q6 hours, lactate, Fick CO, CI, and SVR calculation  - Portable CXR each morning to evaluate PA cath location    -We need to reduce SVR to the range [phone] dynes/s/cm   -Recommend:  continue arterial line for BP monitoring      Nitroprusside infusion with goal SBP > 90 and MAP 65-79mmHg    -The cardiac output and input are low despite Dobutamine 2.65mcg/kg/min   -Recommend:  increase dobutamine to 46mcg/kg/min      Continue amiodarone     -The filling pressures remain elevated with RA 17 and PW 17 (PW has normalized but RA is still elevated)   -Recommend:  continue diuresis with bumex 1mg /hr    -If hypotension (SBP<90, MAP<60) develops:   -Recommend: we will not be able to continue nitroprusside      If hypotension persists, levophed is preferred.  Avoid using phenylepherine.    -Regarding advanced HF therapies, he will benefit from continued adjustment of dobutamine infusion based on PA cath CO and CI.  He is not a heart transplant candidate due to age.  He is not a LVAD candidate due to renal function.  If temporary mechanical support is considered,  IABP may be okay (see above); Impella and ECMO is not recommended.    Please send updates  by secure chat or call during the day.  Overnight, please call or text me as needed.    Luella Cook, MD  Advanced Heart Failure and Transplant  [facility]  [place]  Epic Secure Chat:  direct or group "[place] Advanced Heart Failure"

## 2023-07-01 NOTE — Progress Notes (Signed)
07/01/23 0000   CO/CI Determination   SaO2  1   SvO2  0.45   SaO2 - SvO2 0.55   HGB Result 11.4   x 1.36 x 10 85.27   Cardiac Output (FICK) 3.14   Cardiac Index (FICK) 1.47   PVR Determination   PAP 55/34   PAP (Mean) 41 mmHg   Pulmonary Artery Pulsatility Index (PAPI) 0.68   SVR Determination   MAP (mmHg) 86   CVP (mmHg) 31 mmHg   MAP - CVP 55   SVR (dynes/second/cm3) 1401.27 dynes/second/cm3       Started Nitroprusside half an hour prior to FICK.

## 2023-07-01 NOTE — Significant Event (Signed)
Vital  signs reviewed:   Temp: 97.1 F (36.2 C), Heart Rate: 88, Resp Rate: 20, BP: 118/78    Cardiac exam: Regular rate    Pulmonary exam:  no accessory muscle usage    Peripheral pulses:  Normal    Capillary refill:  Delayed    Skin exam:  pink, warm extremities    Mental status:  Normal  [name] [name] states that he is feeling better. No shortness of breath. No chest pain. Mild palpitations when he has ectopy.    Exam done by Lawernce Ion, MD on 07/01/23 at 3:52 PM     EKG and review of telemetry indicate increase in ventricular ectopy.     Assessment: ventricular ectopy from increase dobutamine infusion in setting of severe cardiomyopathy  Acute on chronic systolic congestive heart failure  Hyperglycemia    Plan: increase amiodarone infusion to 1 mg/min  Improved on insulin drip that was ordered    Updated patient and his wife at the bedside regarding medical condition and therapy.  Reviewed cardiology including advanced heart failure plan.  Answered their questions.    Critical care time: 31 minutes of bedside evaluation, chart review, eKG review, placement of orders

## 2023-07-01 NOTE — Plan of Care (Signed)
Problem: Day of Admission - Heart Failure  Goal: Heart Failure Admission  Outcome: Progressing  Flowsheets (Taken 07/01/2023 1059)  Heart Failure Admission:   Fluid restriction   Oxygen as needed   Assess for swelling/edema and document   Initiate education with patient and caregiver using CHF Warning Zones and Educational Videos (Tigr or [employer])     Problem: Everyday - Heart Failure  Goal: Stable Vital Signs and Fluid Balance  Outcome: Progressing  Flowsheets (Taken 06/30/2023 2313 by Vladimir Faster, Corazon, RN)  Stable Vital Signs and Fluid Balance:   Monitor, assess vital signs and telemetry per policy   Monitor labs and report abnormalities to physician   Strict Intake/Output   Assess for swelling/edema   Wean oxygen as needed if appropriate   Daily Standing Weights in the morning using the same scale, after using the bathroom and before breadfast.  If unable to stand, zero the bed and use the bed scale  Goal: Nutritional Intake is Adequate  Outcome: Not Progressing  Flowsheets (Taken 07/01/2023 1059)  Nutritional Intake is Adequate: Fluid Restricction if needed     Problem: Everyday - Heart Failure  Goal: Nutritional Intake is Adequate  Outcome: Not Progressing  Flowsheets (Taken 07/01/2023 1059)  Nutritional Intake is Adequate: Fluid Restricction if needed

## 2023-07-01 NOTE — Consults (Signed)
Initial Case Management Assessment and Discharge Planning [facility]   Patient Name: [name] [name], [name] [name]   Date of Birth [birth year]   Attending Physician: Lawernce Ion, MD   Primary Care Physician: Zorita Pang, MD   Length of Stay 1   Reason for Consult / Chief Complaint Acute on chronic congestive heart failure        Situation   Admission DX:   1. Acute on chronic congestive heart failure, unspecified heart failure type    2. Chronic kidney disease, unspecified CKD stage    3. Thrombocytopenia    4. Elevated blood pressure reading    5. Acute on chronic systolic congestive heart failure        A/O Status: X 3    Patient admitted from: ER  Admission Status: inpatient    Health Care Agent: Spouse  Name: Srihari Hedges  Phone number: [phone]       Background     Advanced directive:   Patient does not have an advanced directive. Wishes to speak to spouse prior to making decisions.     Code Status:   Full Code     Residence: One story home/apartment    PCP: Zorita Pang, MD    Emergency contact:   Extended Emergency Contact Information  Primary Emergency Contact: [facility]  Address: [address]           [place], [place] [address] [place] Phone: [phone]  Relation: Spouse      ADL/IADL's: Independent  Previous Level of function: 7 Independent     DME: None    Pharmacy:     [facility] [phone] [place], [place] - [address] AT [facility] [address]  [address]  [place] [address]  Phone: [phone] Fax: [phone]    Home Health: The patient is not currently receiving home health services.  Patient received HH services after last admission with [facility].     Previous SNF/AR: [facility]    UAI on file?: No     Assessment   CM met with patient at bedside for initial case management assessment for discharge planning assist. Demographics, Pharmacy and PCP verified.   Patient lives with spouse in a one level home. No stairs.   He  uses no DME. No home oxygen and no CPAP.   Per patient he is independent with his ADL/IADL's.   He confirms that his spouse will transport at discharge.   Patient and spouse have 4 grown children. All live in various states on the [facility].     CM will continue to follow for assist.     BARRIERS TO DISCHARGE: medical readiness     Recommendation   D/C Plan A: Home with family    D/C Plan B: Home with home health    Transport for discharge? Mode of transportation: Private Vehicle - Family/Friend to drive patientSpouse to transport at discharge.        Majour Frei Maggie Schwalbe BSN, RN  Case Manager  [facility]  [address]  [place], [place] [address]    T: [phone]   F: [phone]

## 2023-07-02 ENCOUNTER — Telehealth (INDEPENDENT_AMBULATORY_CARE_PROVIDER_SITE_OTHER): Payer: Self-pay

## 2023-07-02 ENCOUNTER — Inpatient Hospital Stay: Payer: Commercial Managed Care - POS

## 2023-07-02 LAB — BASIC METABOLIC PANEL
Anion Gap: 11 (ref 5.0–15.0)
Anion Gap: 12 (ref 5.0–15.0)
Anion Gap: 12 (ref 5.0–15.0)
Anion Gap: 13 (ref 5.0–15.0)
BUN: 46 mg/dL — ABNORMAL HIGH (ref 9–28)
BUN: 47 mg/dL — ABNORMAL HIGH (ref 9–28)
BUN: 49 mg/dL — ABNORMAL HIGH (ref 9–28)
BUN: 49 mg/dL — ABNORMAL HIGH (ref 9–28)
CO2: 19 meq/L (ref 17–29)
CO2: 21 meq/L (ref 17–29)
CO2: 21 meq/L (ref 17–29)
CO2: 21 meq/L (ref 17–29)
Calcium: 8.5 mg/dL (ref 7.9–10.2)
Calcium: 8.6 mg/dL (ref 7.9–10.2)
Calcium: 8.7 mg/dL (ref 7.9–10.2)
Calcium: 8.9 mg/dL (ref 7.9–10.2)
Chloride: 105 meq/L (ref 99–111)
Chloride: 107 meq/L (ref 99–111)
Chloride: 108 meq/L (ref 99–111)
Chloride: 108 meq/L (ref 99–111)
Creatinine: 3.1 mg/dL — ABNORMAL HIGH (ref 0.5–1.5)
Creatinine: 3.1 mg/dL — ABNORMAL HIGH (ref 0.5–1.5)
Creatinine: 3.2 mg/dL — ABNORMAL HIGH (ref 0.5–1.5)
Creatinine: 3.3 mg/dL — ABNORMAL HIGH (ref 0.5–1.5)
GFR: 18.6 mL/min/{1.73_m2} — ABNORMAL LOW (ref >=60.0)
GFR: 19.3 mL/min/{1.73_m2} — ABNORMAL LOW (ref >=60.0)
GFR: 20.1 mL/min/{1.73_m2} — ABNORMAL LOW (ref >=60.0)
GFR: 20.1 mL/min/{1.73_m2} — ABNORMAL LOW (ref >=60.0)
Glucose: 136 mg/dL — ABNORMAL HIGH (ref 70–100)
Glucose: 214 mg/dL — ABNORMAL HIGH (ref 70–100)
Glucose: 218 mg/dL — ABNORMAL HIGH (ref 70–100)
Glucose: 369 mg/dL — ABNORMAL HIGH (ref 70–100)
Potassium: 3.7 meq/L (ref 3.5–5.3)
Potassium: 3.9 meq/L (ref 3.5–5.3)
Potassium: 4.4 meq/L (ref 3.5–5.3)
Potassium: 4.4 meq/L (ref 3.5–5.3)
Sodium: 138 meq/L (ref 135–145)
Sodium: 140 meq/L (ref 135–145)
Sodium: 140 meq/L (ref 135–145)
Sodium: 140 meq/L (ref 135–145)

## 2023-07-02 LAB — CBC
Absolute nRBC: 0.03 10*3/uL — ABNORMAL HIGH (ref <=0.00)
Hematocrit: 31.3 % — ABNORMAL LOW (ref 37.6–49.6)
Hemoglobin: 9.9 g/dL — ABNORMAL LOW (ref 12.5–17.1)
MCH: 27.5 pg (ref 25.1–33.5)
MCHC: 31.6 g/dL (ref 31.5–35.8)
MCV: 86.9 fL (ref 78.0–96.0)
Platelet Count: 79 10*3/uL — ABNORMAL LOW (ref 142–346)
RBC: 3.6 10*6/uL — ABNORMAL LOW (ref 4.20–5.90)
RDW: 19 % — ABNORMAL HIGH (ref 11–15)
WBC: 6.7 10*3/uL (ref 3.10–9.50)
nRBC %: 0.4 /100{WBCs} — ABNORMAL HIGH (ref <=0.0)

## 2023-07-02 LAB — COOXIMETRY PROFILE
Carboxyhemoglobin: 1.1 % (ref 0.4–2.7)
Carboxyhemoglobin: 1.2 % (ref 0.4–2.7)
Carboxyhemoglobin: 1.2 % (ref 0.4–2.7)
Carboxyhemoglobin: 1.2 % (ref 0.4–2.7)
Hematocrit Total Calculated: 30.6 % — ABNORMAL LOW (ref 40.0–54.0)
Hematocrit Total Calculated: 31.3 % — ABNORMAL LOW (ref 40.0–54.0)
Hematocrit Total Calculated: 31.6 % — ABNORMAL LOW (ref 40.0–54.0)
Hematocrit Total Calculated: 32.8 % — ABNORMAL LOW (ref 40.0–54.0)
Hemoglobin Total: 10 g/dL — ABNORMAL LOW (ref 13.0–17.0)
Hemoglobin Total: 10.2 g/dL — ABNORMAL LOW (ref 13.0–17.0)
Hemoglobin Total: 10.3 g/dL — ABNORMAL LOW (ref 13.0–17.0)
Hemoglobin Total: 10.7 g/dL — ABNORMAL LOW (ref 13.0–17.0)
Methemoglobin: 0.5 % (ref 0.0–1.5)
Methemoglobin: 0.6 % (ref 0.0–1.5)
Methemoglobin: 0.7 % (ref 0.0–1.5)
Methemoglobin: 0.7 % (ref 0.0–1.5)
Oxygenated Hemoglobin: 43.4 % — ABNORMAL LOW (ref 94.0–98.0)
Oxygenated Hemoglobin: 44 % — ABNORMAL LOW (ref 94.0–98.0)
Oxygenated Hemoglobin: 51.5 % — ABNORMAL LOW (ref 94.0–98.0)
Oxygenated Hemoglobin: 55.9 % — ABNORMAL LOW (ref 94.0–98.0)

## 2023-07-02 LAB — WHOLE BLOOD GLUCOSE POCT
Whole Blood Glucose POCT: 131 mg/dL — ABNORMAL HIGH (ref 70–100)
Whole Blood Glucose POCT: 138 mg/dL — ABNORMAL HIGH (ref 70–100)
Whole Blood Glucose POCT: 145 mg/dL — ABNORMAL HIGH (ref 70–100)
Whole Blood Glucose POCT: 162 mg/dL — ABNORMAL HIGH (ref 70–100)
Whole Blood Glucose POCT: 174 mg/dL — ABNORMAL HIGH (ref 70–100)
Whole Blood Glucose POCT: 178 mg/dL — ABNORMAL HIGH (ref 70–100)
Whole Blood Glucose POCT: 187 mg/dL — ABNORMAL HIGH (ref 70–100)
Whole Blood Glucose POCT: 188 mg/dL — ABNORMAL HIGH (ref 70–100)
Whole Blood Glucose POCT: 191 mg/dL — ABNORMAL HIGH (ref 70–100)
Whole Blood Glucose POCT: 192 mg/dL — ABNORMAL HIGH (ref 70–100)
Whole Blood Glucose POCT: 205 mg/dL — ABNORMAL HIGH (ref 70–100)
Whole Blood Glucose POCT: 206 mg/dL — ABNORMAL HIGH (ref 70–100)
Whole Blood Glucose POCT: 217 mg/dL — ABNORMAL HIGH (ref 70–100)
Whole Blood Glucose POCT: 217 mg/dL — ABNORMAL HIGH (ref 70–100)
Whole Blood Glucose POCT: 225 mg/dL — ABNORMAL HIGH (ref 70–100)
Whole Blood Glucose POCT: 317 mg/dL — ABNORMAL HIGH (ref 70–100)
Whole Blood Glucose POCT: 337 mg/dL — ABNORMAL HIGH (ref 70–100)

## 2023-07-02 LAB — LACTIC ACID
Whole Blood Lactic Acid: 1.1 mmol/L (ref 0.2–2.0)
Whole Blood Lactic Acid: 1.1 mmol/L (ref 0.2–2.0)
Whole Blood Lactic Acid: 1.6 mmol/L (ref 0.2–2.0)
Whole Blood Lactic Acid: 2 mmol/L (ref 0.2–2.0)

## 2023-07-02 LAB — MAGNESIUM
Magnesium: 2.5 mg/dL (ref 1.6–2.6)
Magnesium: 2.6 mg/dL (ref 1.6–2.6)
Magnesium: 2.6 mg/dL (ref 1.6–2.6)
Magnesium: 2.6 mg/dL (ref 1.6–2.6)

## 2023-07-02 LAB — HEMOGLOBIN AND HEMATOCRIT
Hematocrit: 31.1 % — ABNORMAL LOW (ref 37.6–49.6)
Hemoglobin: 10 g/dL — ABNORMAL LOW (ref 12.5–17.1)

## 2023-07-02 MED ORDER — DEXTROSE 50 % IV SOLN
12.5000 g | INTRAVENOUS | Status: DC | PRN
Start: 2023-07-02 — End: 2023-07-02

## 2023-07-02 MED ORDER — POTASSIUM CHLORIDE CRYS ER 20 MEQ PO TBCR
20.0000 meq | EXTENDED_RELEASE_TABLET | Freq: Once | ORAL | Status: AC
Start: 2023-07-02 — End: 2023-07-02
  Administered 2023-07-02: 20 meq via ORAL
  Filled 2023-07-02: qty 1

## 2023-07-02 MED ORDER — INSULIN GLARGINE 100 UNIT/ML SC SOLN
15.0000 [IU] | Freq: Once | SUBCUTANEOUS | Status: AC
Start: 2023-07-02 — End: 2023-07-02
  Administered 2023-07-02: 15 [IU] via SUBCUTANEOUS
  Filled 2023-07-02: qty 15

## 2023-07-02 MED ORDER — ALUM & MAG HYDROXIDE-SIMETH 200-200-20 MG/5ML PO SUSP
30.0000 mL | ORAL | Status: DC | PRN
Start: 2023-07-02 — End: 2023-07-07
  Administered 2023-07-02 – 2023-07-07 (×5): 30 mL via ORAL
  Filled 2023-07-02 (×5): qty 30

## 2023-07-02 MED ORDER — INSULIN LISPRO 100 UNIT/ML SOLN (WRAP)
1.0000 [IU] | Freq: Every evening | Status: DC
Start: 2023-07-02 — End: 2023-07-02

## 2023-07-02 MED ORDER — GLUCOSE 40 % PO GEL (WRAP)
15.0000 g | ORAL | Status: DC | PRN
Start: 2023-07-02 — End: 2023-07-02

## 2023-07-02 MED ORDER — INSULIN LISPRO 100 UNIT/ML SOLN (WRAP)
2.0000 [IU] | Freq: Three times a day (TID) | Status: DC
Start: 2023-07-02 — End: 2023-07-02
  Administered 2023-07-02: 8 [IU] via SUBCUTANEOUS
  Administered 2023-07-02: 4 [IU] via SUBCUTANEOUS
  Filled 2023-07-02: qty 24
  Filled 2023-07-02: qty 12

## 2023-07-02 MED ORDER — DEXTROSE 10 % IV BOLUS
12.5000 g | INTRAVENOUS | Status: DC | PRN
Start: 2023-07-02 — End: 2023-07-02

## 2023-07-02 MED ORDER — HYDRALAZINE HCL 25 MG PO TABS
50.0000 mg | ORAL_TABLET | Freq: Three times a day (TID) | ORAL | Status: DC
Start: 2023-07-02 — End: 2023-07-03
  Administered 2023-07-02 – 2023-07-03 (×2): 50 mg via ORAL
  Filled 2023-07-02 (×2): qty 2

## 2023-07-02 MED ORDER — INSULIN GLARGINE 100 UNIT/ML SC SOLN
15.0000 [IU] | Freq: Every morning | SUBCUTANEOUS | Status: DC
Start: 2023-07-03 — End: 2023-07-06
  Administered 2023-07-03 – 2023-07-05 (×3): 15 [IU] via SUBCUTANEOUS
  Filled 2023-07-02 (×3): qty 15

## 2023-07-02 MED ORDER — HYDRALAZINE HCL 25 MG PO TABS
25.0000 mg | ORAL_TABLET | Freq: Once | ORAL | Status: AC
Start: 2023-07-02 — End: 2023-07-02
  Administered 2023-07-02: 25 mg via ORAL
  Filled 2023-07-02: qty 1

## 2023-07-02 MED ORDER — DEXTROSE 10 % IV BOLUS
12.5000 g | INTRAVENOUS | Status: DC | PRN
Start: 2023-07-02 — End: 2023-07-04

## 2023-07-02 MED ORDER — INSULIN LISPRO 100 UNIT/ML SOLN (WRAP)
1.0000 [IU] | Freq: Every evening | Status: DC
Start: 2023-07-02 — End: 2023-07-07
  Administered 2023-07-02: 5 [IU] via SUBCUTANEOUS
  Administered 2023-07-03: 4 [IU] via SUBCUTANEOUS
  Administered 2023-07-04 – 2023-07-05 (×2): 2 [IU] via SUBCUTANEOUS
  Filled 2023-07-02: qty 12
  Filled 2023-07-02: qty 6
  Filled 2023-07-02: qty 15
  Filled 2023-07-02: qty 6

## 2023-07-02 MED ORDER — INSULIN GLARGINE 100 UNIT/ML SC SOLN
15.0000 [IU] | Freq: Every evening | SUBCUTANEOUS | Status: DC
Start: 2023-07-02 — End: 2023-07-06
  Administered 2023-07-02 – 2023-07-05 (×4): 15 [IU] via SUBCUTANEOUS
  Filled 2023-07-02 (×4): qty 15

## 2023-07-02 MED ORDER — GLUCAGON 1 MG IJ SOLR (WRAP)
1.0000 mg | INTRAMUSCULAR | Status: DC | PRN
Start: 2023-07-02 — End: 2023-07-02

## 2023-07-02 MED ORDER — INSULIN LISPRO 100 UNIT/ML SOLN (WRAP)
8.0000 [IU] | Freq: Once | Status: AC
Start: 2023-07-02 — End: 2023-07-02
  Administered 2023-07-02: 8 [IU] via SUBCUTANEOUS

## 2023-07-02 MED ORDER — INSULIN LISPRO 100 UNIT/ML SOLN (WRAP)
2.0000 [IU] | Freq: Three times a day (TID) | Status: DC
Start: 2023-07-03 — End: 2023-07-07
  Administered 2023-07-03 (×2): 4 [IU] via SUBCUTANEOUS
  Administered 2023-07-04: 6 [IU] via SUBCUTANEOUS
  Administered 2023-07-04: 4 [IU] via SUBCUTANEOUS
  Administered 2023-07-04: 6 [IU] via SUBCUTANEOUS
  Filled 2023-07-02 (×2): qty 18
  Filled 2023-07-02 (×3): qty 12

## 2023-07-02 MED ORDER — HYDRALAZINE HCL 25 MG PO TABS
25.0000 mg | ORAL_TABLET | Freq: Three times a day (TID) | ORAL | Status: DC
Start: 2023-07-02 — End: 2023-07-02
  Administered 2023-07-02: 25 mg via ORAL
  Filled 2023-07-02: qty 1

## 2023-07-02 MED ORDER — GLUCOSE 40 % PO GEL (WRAP)
15.0000 g | ORAL | Status: DC | PRN
Start: 2023-07-02 — End: 2023-07-04

## 2023-07-02 MED ORDER — DEXTROSE 50 % IV SOLN
12.5000 g | INTRAVENOUS | Status: DC | PRN
Start: 2023-07-02 — End: 2023-07-04

## 2023-07-02 NOTE — Progress Notes (Signed)
07/02/23 1155   VO2 Determination   Height 1.829 m (6' 0.01")   Weight FICK (only) 90.4 kg (199 lb 4.7 oz)   BSA (Fick only) (Calculated - sq m) 2.14 sq meters   VO2 267.5   CO/CI Determination   SaO2  0.98   SvO2  0.43   SaO2 - SvO2 0.55   HGB Result 9.9   x 1.36 x 10 74.05   Cardiac Output (FICK) 3.61   Cardiac Index (FICK) 1.69   Cardiac Power Output 0.58   PVR Determination   PAP 51/24   PAP (Mean) 10 mmHg   Pulmonary Artery Pulsatility Index (PAPI) 1.93   SVR Determination   MAP (mmHg) 73   CVP (mmHg) 14 mmHg   MAP - CVP 59   SVR (dynes/second/cm3) 1307.48 dynes/second/cm3     This RN notified MD Webb Silversmith and MD Mendiguren of above results via secure message @ [phone].     Values listed above calculated while the patient was on the following gtts:   Bumex @1   Amiodarone @ 1  Dobutamine @ 5  Nitroprusside @ 0.75

## 2023-07-02 NOTE — Plan of Care (Signed)
Problem: Compromised Moisture  Goal: Moisture level Interventions  Outcome: Progressing  Flowsheets (Taken 07/02/2023 0800)  Moisture level Interventions: Moisture wicking products, Moisture barrier cream     Problem: Compromised Activity/Mobility  Goal: Activity/Mobility Interventions  Outcome: Progressing  Flowsheets (Taken 07/02/2023 0800)  Activity/Mobility Interventions: Pad bony prominences, TAP Seated positioning system when OOB, Promote PMP, Reposition q 2 hrs / turn clock, Offload heels     Problem: Compromised Sensory Perception  Goal: Sensory Perception Interventions  Outcome: Progressing  Flowsheets (Taken 07/02/2023 0800)  Sensory Perception Interventions: Offload heels, Pad bony prominences, Reposition q 2hrs/turn Clock, Q2 hour skin assessment under devices if present     Problem: Compromised Nutrition  Goal: Nutrition Interventions  Outcome: Progressing  Flowsheets (Taken 07/02/2023 0800)  Nutrition Interventions: Discuss nutrition at Rounds, I&Os, Document % meal eaten, Daily weights     Problem: Compromised Friction/Shear  Goal: Friction and Shear Interventions  Outcome: Progressing  Flowsheets (Taken 07/02/2023 0800)  Friction and Shear Interventions: Pad bony prominences, Off load heels, HOB 30 degrees or less unless contraindicated, Consider: TAP seated positioning, Heel foams     Problem: Moderate/High Fall Risk Score >5  Goal: Patient will remain free of falls  Outcome: Progressing  Flowsheets (Taken 07/02/2023 0800)  High (Greater than 13):   LOW-Fall Interventions Appropriate for Low Fall Risk   MOD-Include family in multidisciplinary POC discussions   MOD-Place Fall Risk level on whiteboard in room   HIGH-Bed alarm on at all times while patient in bed   HIGH-Visual cue at entrance to patient's room     Problem: Day of Admission - Heart Failure  Goal: Heart Failure Admission  Outcome: Progressing  Flowsheets (Taken 07/02/2023 0917)  Heart Failure Admission:   Standing Weight on admission, if  unable to stand zero the bed and use the bed scale   Vital signs and telemetry per policy   Strict Intake/Output   Fluid restriction   Oxygen as needed   Assess for swelling/edema and document   Initiate education with patient and caregiver using CHF Warning Zones and Educational Videos (Tigr or [employer])     Problem: Everyday - Heart Failure  Goal: Stable Vital Signs and Fluid Balance  Outcome: Progressing  Flowsheets (Taken 07/02/2023 0917)  Stable Vital Signs and Fluid Balance:   Daily Standing Weights in the morning using the same scale, after using the bathroom and before breadfast.  If unable to stand, zero the bed and use the bed scale   Monitor, assess vital signs and telemetry per policy   Monitor labs and report abnormalities to physician   Assess for swelling/edema   Fluid Restriction   Wean oxygen as needed if appropriate   Strict Intake/Output  Goal: Mobility/Activity is Maintained at Optimal Level for Patient  Outcome: Progressing  Flowsheets (Taken 07/02/2023 0917)  Mobility/Activity is Maintained at Optimal Level for Patient:   Increase mobility as tolerated/progressive mobility protocol   Maintain SCD's as Ordered   Perform active/passive ROM   Reposition patient every 2 hours and as needed unless able to reposition self   Assess for changes in respiratory status, level of consciousness and/or development of fatigue  Goal: Nutritional Intake is Adequate  Outcome: Progressing  Flowsheets (Taken 07/02/2023 0917)  Nutritional Intake is Adequate:   Cardiac diet-2 gm Sodium   Fluid Restricction if needed   Consult/Collaborate with Nutritionist   Patient and family teaching on low sodium diet   Assess appetite,anorexia and amount of meal/food tolerated   Encourage/perform oral  hygiene as appropriate  Goal: Teaching-Using CHF Warning Zones and Educational Videos  Outcome: Progressing  Flowsheets (Taken 07/02/2023 0917)  Teaching-Using CHF Warning Zones and Educational Videos:   Daily Standing Weights &  record   Signs & Symptoms of CHF   CHF Warning Zones and when to call for help   Sodium Restriction   Medications   Fluid Restriction if appropriate   Document in the Education Tab in EPIC with Teach-back     Problem: Hemodynamic Status: Cardiac  Goal: Stable vital signs and fluid balance  Outcome: Progressing  Flowsheets (Taken 07/02/2023 0917)  Stable vital signs and fluid balance: Assess signs and symptoms associated with cardiac rhythm changes     Problem: Inadequate Tissue Perfusion  Goal: Adequate tissue perfusion will be maintained  Outcome: Progressing  Flowsheets (Taken 07/02/2023 0917)  Adequate tissue perfusion will be maintained:   Monitor/assess neurovascular status (pulses, capillary refill, pain, paresthesia, paralysis, presence of edema)   Monitor/assess lab values and report abnormal values   Monitor/assess for signs of VTE (edema of calf/thigh redness, pain)   Monitor for signs and symptoms of a pulmonary embolism (dyspnea, tachypnea, tachycardia, confusion)   Encourage/assist patient as needed to turn, cough, and perform deep breathing every 2 hours     Problem: Ineffective Gas Exchange  Goal: Effective breathing pattern  Outcome: Progressing  Flowsheets (Taken 07/02/2023 0917)  Effective breathing pattern:   Maintain CO2 level per LIP order   Monitor end tidal CO2 level per LIP order   Teach/reinforce use of ordered respiratory interventions (ie. CPAP, BiPAP, Incentive Spirometer, Acapella)

## 2023-07-02 NOTE — Consults (Addendum)
Addendum, 430p:  I met with [name] [name] and [name] [name] this afternoon.  Updates provided.      He received hydralazine 25mg  PO x1 around 2pm.  Nitroprusside 0.75 down to 0.25 and MAP still 79.    Hydralazine 25mg  PO x1 now.  Hydralazine increased to 50mg  PO q8 hours, next dose at 10pm.      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  He has been more active at home for the last 6 months.  MV clip has been considered.      He was admitted 06/30/2023 with worsening HF symptoms to include weight gain, LE edema, and DOE.  He was found to be in SCAI B-C shock and transferred to the ICU.  He was given bumex IV then infusion.  Dobutamine 2.29mcg/kg/min was started.    PA cath demonstrated RA 19, PA 42/23 (33), PW 27, Fick CI 1.59, TD CI 0.9.  He has had an improvement in filling pressures and CI.    Scheduled Meds:  Current Facility-Administered Medications   Medication Dose Route Frequency    atorvastatin  80 mg Oral Daily    pantoprazole  40 mg Oral QAM AC     Continuous Infusions:   amiodarone 1 mg/min (07/02/23 0821)    bumetanide 1 mg/hr (07/02/23 0403)    DOBUTamine 5 mcg/kg/min (07/02/23 0626)    insulin regular 3 Units/hr (07/02/23 0812)    nitroprusside 1 mcg/kg/min (07/02/23 0306)     PRN Meds:.acetaminophen, alum & mag hydroxide-simethicone, benzonatate, dextrose **OR** dextrose **OR** dextrose **OR** glucagon (rDNA), dextrose **OR** dextrose, guaiFENesin-dextromethorphan, melatonin, naloxone, ondansetron **OR** [DISCONTINUED] ondansetron, senna-docusate, traZODone    PE:  BP 136/69   Pulse 91   Temp 97.2 F (36.2 C) (Temporal)   Resp 21   Ht 1.829 m (6' 0.01")   Wt 90.4 kg (199 lb 4.7 oz)   SpO2 95%   BMI 27.02 kg/m      Constitutional: Cooperative, alert - tachypneic at rest has resolved.  Some dyspnea with talking.  Neck: JVD mildly elevated on the left, IJ cath in right  Cardiac: Regualr rate and regular rhythm, holosystolic murmur at LLSB  Pulmonary: dyspnea with talking, lungs with mild crackles in the lateral fields  Extremities: 2+ edema at his feet.  Feet are cool.    Lab Results   Component Value Date    WBC 6.70 07/02/2023    HGB 9.9 (L) 07/02/2023    HCT 31.3 (L) 07/02/2023    MCV 86.9 07/02/2023    PLT 79 (L) 07/02/2023     Lab Results   Component Value Date    CREAT 3.1 (H) 07/02/2023    BUN 46 (H) 07/02/2023    NA 140 07/02/2023    K 3.9 07/02/2023    CL 108 07/02/2023    CO2 19 07/02/2023     Lab Results   Component Value Date    ALT 53 06/29/2023    AST 62 (H) 06/29/2023    ALKPHOS 159 (H) 06/29/2023    BILITOTAL 1.7 (H) 06/29/2023     Lab Results   Component Value Date    INR 1.3 (H) 02/11/2023  INR 1.9 (H) 02/10/2023    INR 1.4 (H) 12/24/2022    PT 15.3 (H) 02/11/2023    PT 22.1 (H) 02/10/2023    PT 16.3 (H) 12/24/2022     CXR (this morning on left, yesterday on right) with improvement of pulmonary edema      PA cath on dobutamine 5 and nitroprusside:  6am:  CO 5, CI 2.36, SVR 920, mixed venous 56%  12am:  CO 4.5, CI 2.12, SVR 1183, mixed venous 51%  6pm:  CO 4.64, CI 2.17, SVR 948, mixed venous 58%    At 930a:  RA 10, PA 45/19 (30), PW 17    Cr:  4.1, 3.7, 3.5 (x3), 3.4, now 3.1  Lactic acid 1.1, 1.5, 1.4, 2.9, now 1.1    I/O:    9/11:  -4.1L  9/12:  -1.45L    Current infusions:  Amiodarone 1/min  Bumex 1/hr  Dobutamine 5  Nitroprusside 1    Impression:  [name] [name]. [name] [name] is a 77 y.o. 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  LVAD was not possible due to continued CKD with GFR <30.      He has had modest improvement since interventions on 9/11 and overnight.  He appears better and is not tachypneic at rest.  His chest x-ray demonstrates improvement in  pulmonary edema.    During exam today his wedge pressure was 17.  His CVP remains elevated at 10, down from 19 on 9/11.    Today we will continue to optimize his hemodynamics.  Will keep dobutamine at 5.    Diuresis will continue with bumx 1mg /hr.      Regarding temporary mechanical support, I am not sure there is an option for additional therapy after this hospitalization.  Specifically, and mitral valve clip has been considered.  With severe LV dysfunction a mitral valve clip will overload the pressure in the left ventricle and make it fail further.  Therefore placing a intra-aortic balloon pump will only be temporizing very critical cardiac status that has a low risk of survival.  I explained to [name] [name] there are goal here is to optimize everything is much as possible with hopes that he may leave the hospital.  I also discussed the critical nature of his cardiac status and that he may not make it out of the hospital at this time.    Recommendations:  - q6 hours, lactate, Fick CO, CI, and SVR calculation  - Portable CXR each morning to evaluate PA cath location    -We need to reduce SVR to the range [id number] dynes/s/cm   -Recommend:  continue arterial line for BP monitoring      Nitroprusside infusion with goal SBP > 90 and MAP 65-63mmHg      Please start hydralazine 25mg  po q8hours.  Titrate off nitroprusside as hydralazine helps reduce BP.  Use the same parameters of SBP>90 and MAP 65-70.  If needed, increase to 50mg .    -The cardiac output and input were low despite Dobutamine 2.67mcg/kg/min   -Recommend:  Continue dobutamine to 78mcg/kg/min      Continue amiodarone     -The filling pressures remain elevated with RA 10-15 and PW 17 (PW has normalized but RA is still elevated)   -Recommend:  continue diuresis with bumex 1mg /hr    -If hypotension (SBP<90, MAP<60) develops:   -Recommend: we will not be able to continue nitroprusside      If hypotension  persists, levophed is preferred.  Avoid using  phenylepherine.    -Regarding advanced HF therapies, he will benefit from continued adjustment of dobutamine infusion based on PA cath CO and CI.  He is not a heart transplant candidate due to age.  He is not a LVAD candidate due to renal function.  If temporary mechanical support is considered, IABP may be okay (see above); Impella and ECMO is not recommended.    Please send updates by secure chat or call during the day.  Overnight, please call or text me as needed.    Luella Cook, MD  Advanced Heart Failure and Transplant  [facility]  [place]  Epic Secure Chat:  direct or group "[facility] Advanced Heart Failure"

## 2023-07-02 NOTE — Progress Notes (Signed)
Dr Lesle Reek notified that patient's POC glucose is 337. Insulin gtt stopped today at 1200 and received one time dose of 15 units of lantus at 1307. Per sliding scale, patient to receive 5 units of SQ lispro. Lantus added and additional 8 units of SQ lispro ordered now. Will continue to monitor.

## 2023-07-02 NOTE — Telephone Encounter (Signed)
[facility]    Called and spoke w/pt's wife. Advised that since he is currently admitted at [facility], his 9/16 appts will be r/s'd to 9/30 for now. Wife understood and verbally acknowledged the information.

## 2023-07-02 NOTE — Consults (Signed)
[facility] Palliative Care Medicine & Comprehensive Care  Service Phone Number: [phone]  Mon-Fri 9a-4p     Palliative Care Brief Note   Date Time: 07/02/23 4:23 PM   Patient Name: [name] [name], [name] [name]   Location: IC10/IC10-A      [name]. 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   He was admitted 9/11 with worsening heart failure - weight gain/edema/DOE.  He was found in cardiogenic shock and admitted to ICU.    Palliative care consulted to assist with clarifying goals of care and facilitate advance care planning.  Mr. Vandevoort is known to this clinician from his admission in April when the [place] Advance Directive was introduced as well as discussion regarding resuscitation was conducted.  At that time his goal was to maintain an optimal quality of life for as long as possible.  He also wanted to maintain Full Code status with attempt of resuscitation if necessary.    Will review goals of care, and advance care planning with him and his wife on Monday, September 16th at 1:30pm.    Gillie Manners. Alyria Krack NP-C, [facility]

## 2023-07-02 NOTE — Progress Notes (Signed)
CRITICAL CARE  [facility]     [facility]- Critical Care Note     ICU Daily Progress Note        Date Time: 07/02/23 9:20 PM  Patient Name: [name] [name]  Attending Physician: Lawernce Ion, MD  Room: IC10/IC10-A   Admit Date: 06/29/2023  LOS: 2 days      Assessment:   Cardiogenic shock, improving physiologically with IV dobutamine, nitroprusside, diuresing with Bumex 1 mg IV per hour, and amiodarone for ventricular arrhythmias which have been controlled.  Hyperglycemia, switch from IV insulin to Lantus and subcutaneous coverage.  No acute distress.  Monitoring oral intake.  Denies shortness of breath or chest pain.  Out of bed to chair today  Diuresis length of stay 4.8 L    Lactic acid normal 1.6.  SVR 1152, cardiac output 3.61, cardiac index 1.69  Plan:   Weaning down nitroprusside.  Continue diuresis and inotropic support.  Case discussed with Dr. Webb Silversmith.    OTHER:  Glycemic Control. Glucose stabilizer per ICU protocol when on insulin drip. Maintain blood glucose 140-180.   Replace electrolytes per ICU electrolyte replacement protocol    Aggressive pulmonary toileting. Incentive spirometry when appropriate. Aspiration precautions.      Quality Care: Stress ulcer prophylaxis, DVT prophylaxis, HOB elevated, Infection control all reviewed and addressed.  Events and notes from last 24 hours reviewed. Care plan discussed with nursing.     CC TIME: >50 min     Subjective:   No acute distress  Medications:       Scheduled Meds: PRN Meds:    atorvastatin, 80 mg, Oral, Daily  hydrALAZINE, 50 mg, Oral, Q8H SCH  insulin glargine, 15 Units, Subcutaneous, QHS  [START ON 07/03/2023] insulin glargine, 15 Units, Subcutaneous, QAM  insulin lispro, 1-6 Units, Subcutaneous, QHS  [START ON 07/03/2023] insulin lispro, 2-10 Units, Subcutaneous, TID AC  insulin lispro, 8 Units, Subcutaneous, Once  pantoprazole, 40 mg, Oral, QAM AC        Continuous Infusions:   amiodarone 0.5 mg/min (07/02/23 1503)     bumetanide 1 mg/hr (07/02/23 0403)    DOBUTamine 5 mcg/kg/min (07/02/23 0626)    nitroprusside 0.25 mcg/kg/min (07/02/23 1504)    acetaminophen, 650 mg, Q4H PRN  alum & mag hydroxide-simethicone, 30 mL, Q4H PRN  benzonatate, 200 mg, TID PRN  dextrose, 15 g of glucose, PRN   Or  dextrose, 12.5 g, PRN   Or  dextrose, 12.5 g, PRN  dextrose, 15 g of glucose, PRN   Or  dextrose, 12.5 g, PRN   Or  dextrose, 12.5 g, PRN   Or  glucagon (rDNA), 1 mg, PRN  guaiFENesin-dextromethorphan, 10 mL, Q4H PRN  melatonin, 3 mg, QHS PRN  naloxone, 0.2 mg, PRN  ondansetron, 4 mg, Q6H PRN  senna-docusate, 2 tablet, QHS PRN  traZODone, 50 mg, QHS PRN          Physical Exam:     Vitals:    07/02/23 1830 07/02/23 1845 07/02/23 2000 07/02/23 2100   BP:   109/78    Pulse: 79 85 82 90   Resp: (!) 11 (!) 10 20 (!) 23   Temp:   98.2 F (36.8 C)    TempSrc:   Temporal    SpO2: 100% 100% 98% 98%   Weight:       Height:         Temp (24hrs), Avg:97.5 F (36.4 C), Min:97.2 F (36.2 C), Max:98.2 F (36.8 C)  09/12 0701 - 09/13 0700  In: 1890.47 [P.O.:550; I.V.:1340.47]  Out: 3350 [Urine:3350]     General Appearance: Comfortable  Mental status: Alert responsive  Neuro: Intact  H & N: No JVD sitting upright  Lungs: Basilar crackles clear anteriorly  Cardiac: Regular normal sounds  Abdomen: Soft nontender  Extremities: No edema  Skin: Warm    Data:     Vent Settings:       Labs:     Recent CBC   Recent Labs   Lab 07/02/23  0520 07/01/23  2353 07/01/23  1801 07/01/23  1151 07/01/23  0538 06/30/23  2335 06/30/23  0535   WBC 6.70  --   --   --  7.83  --  10.97*   RBC 3.60*  --   --   --  3.68*  --  4.11*   Hemoglobin 9.9* 10.0* 9.9*  More results in Results Review 10.1*  More results in Results Review 11.4*   Hematocrit 31.3* 31.1* 31.2*  More results in Results Review 32.2*  More results in Results Review 37.1*   MCV 86.9  --   --   --  87.5  --  90.3   Platelet Count 79*  --   --   --  82*  --  108*   More results in Results Review = values in  this interval not displayed.       Recent Labs   Lab 07/02/23  1811 07/02/23  1200 07/02/23  0520 07/01/23  1151 07/01/23  0532 06/30/23  0535 06/29/23  1914 06/29/23  1703 06/26/23  1110 06/26/23  0839   Sodium 138 140 140  More results in Results Review 133*  More results in Results Review  --  137  --  138   Potassium 4.4 4.4 3.9  More results in Results Review 3.8  More results in Results Review  --  5.3  --  4.4   Chloride 105 107 108  More results in Results Review 103  More results in Results Review  --  107  --  106   CO2 21 21 19   More results in Results Review 18  More results in Results Review  --  17  --  21   Glucose 369* 214* 218*  More results in Results Review 406*  More results in Results Review  --  248*  --  260*   BUN 49* 47* 46*  More results in Results Review 54*  More results in Results Review  --  59*  --  59*   Creatinine 3.3* 3.1* 3.1*  More results in Results Review 3.5*  More results in Results Review  --  3.8*  --  3.8*   Magnesium 2.6 2.6 2.5  More results in Results Review 2.4  More results in Results Review  --   --   --  2.4   Phosphorus  --   --   --   --  4.0  --   --   --   --   --    AST (SGOT)  --   --   --   --   --   --   --  62*  --  29   ALT  --   --   --   --   --   --   --  53  --  28   Alkaline Phosphatase  --   --   --   --   --   --   --  159*  --  171*   Bilirubin, Total  --   --   --   --   --   --   --  1.7*  --  1.5*   PTT  --   --   --   --   --   --  27  --   --   --    D-Dimer  --   --   --   --   --   --  0.94*  --   --   --    hs Troponin  --   --   --   --   --   --  67.9* 58.2* 69.4* 72.6*   More results in Results Review = values in this interval not displayed.       Rads:     Radiology Results (24 Hour)       Procedure Component Value Units Date/Time    XR Chest AP Portable [[id number]] Collected: 07/02/23 1146    Order Status: Completed Updated: 07/02/23 1150    Narrative:      HISTORY: evlauate PA cath location.      COMPARISON: 07/01/2023    FINDINGS:    Support lines/tubes: The PA catheter tip terminates over the pulmonary  outflow tract in good position. Implanted pacing device on the left with 4  leads overlapping the heart.    Lungs / pleural space: Mild peribronchial wall thickening centrally may  indicate interstitial inflammation infection or congestion. No overt  pulmonary edema. Densities overlapping the lung bases likely represent  granulomas. There is minimal basilar atelectasis without change. No large  pleural effusion or pneumothorax.     Mediastinum: Borderline cardiac enlargement without change.     Bones and other soft tissues: No acute osseous abnormality.        Impression:         PA catheter tip in good position. Mild peribronchial wall thickening  centrally may indicate interstitial inflammation infection or congestion.  No overt pulmonary edema.    Charlott Rakes, MD  07/02/2023 11:48 AM            Radiological Imaging personally reviewed.    I have personally reviewed the patient's history and 24 hour interval events, along with vitals, labs, radiology images and  ventilator settings and additional findings found in detail within ICU team notes, with their care plans developed with and reviewed by me.     Time spent in patient evaluation and treatment in critical care excluding procedures, and not overlapping any other providers:     minutes.    Signed by: Durward Fortes, MD, MD  Date/Time: 07/02/23 9:20 PM

## 2023-07-02 NOTE — Progress Notes (Signed)
07/02/23 1759   VO2 Determination   Height 1.829 m (6' 0.01")   Weight FICK (only) 90.4 kg (199 lb 4.7 oz)   BSA (Fick only) (Calculated - sq m) 2.14 sq meters   VO2 267.5   CO/CI Determination   SaO2  0.99   SvO2  0.44   SaO2 - SvO2 0.55   HGB Result 9.9   x 1.36 x 10 74.05   Cardiac Output (FICK) 3.61   Cardiac Index (FICK) 1.69   Cardiac Power Output 0.54   PVR Determination   PAP 43/16   PAP (Mean) 27 mmHg   Pulmonary Artery Pulsatility Index (PAPI) 1.69   SVR Determination   MAP (mmHg) 68   CVP (mmHg) 16 mmHg   MAP - CVP 52   SVR (dynes/second/cm3) 1152.35 dynes/second/cm3       1832:  This RN informed MD Mendiguren and MD Webb Silversmith of the above results via secure message.     The above results were calculated while the patient was on the following gtts.     On 0.25 nitroprisside  On bumex @ 1   On dobutamine @ 5  On Amio @ 0.5

## 2023-07-02 NOTE — Progress Notes (Signed)
07/02/23 0600   VO2 Determination   Height 1.829 m (6' 0.01")   Weight FICK (only) 90.4 kg (199 lb 4.7 oz)   BSA (Fick only) (Calculated - sq m) 2.14 sq meters   VO2 267.5   CO/CI Determination   SaO2  0.95   SvO2  0.56   SaO2 - SvO2 0.39   HGB Result 10   x 1.36 x 10 53.04   Cardiac Output (FICK) 5.04   Cardiac Index (FICK) 2.36   Cardiac Power Output 0.86   PVR Determination   PAP 55/24   PAP (Mean) 36 mmHg   Pulmonary Artery Pulsatility Index (PAPI) 1.63   SVR Determination   MAP (mmHg) 77   CVP (mmHg) 19 mmHg   MAP - CVP 58   SVR (dynes/second/cm3) 920.63 dynes/second/cm3     Hemodynamic values relayed to Dr. Nedra Hai, continue same treatment.    K 3.9,Crea 3.1-  Potassium 20 meqs tablet given per MD order.

## 2023-07-02 NOTE — Progress Note - Problem Oriented Charting Notewrit (Signed)
PROGRESS NOTE      Date Time: 07/02/23 10:25 AM  Patient Name: [name] [name], [name] [name]    Chief Complaint:   ARF. CKD 3b.    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  Appeared to be in decompensated CHF and started on dobutamine drips, nitroprusside drips.  Responded well.  Will follow the patient in the office therefore we have been asked to follow the patient in the hospital.    Less short of breath.  Denies chest pain.  Denies abdominal pain.  No fluid retention lower extremities.    Medications:   Scheduled Meds:  Current Facility-Administered Medications   Medication Dose Route Frequency    atorvastatin  80 mg Oral Daily    pantoprazole  40 mg Oral QAM AC     Continuous Infusions:   amiodarone 1 mg/min (07/02/23 0821)    bumetanide 1 mg/hr (07/02/23 0403)    DOBUTamine 5 mcg/kg/min (07/02/23 0626)    insulin regular 6 Units/hr (07/02/23 1023)    nitroprusside 1 mcg/kg/min (07/02/23 0306)     PRN Meds:.acetaminophen, alum & mag hydroxide-simethicone, benzonatate, dextrose **OR** dextrose **OR** dextrose **OR** glucagon (rDNA), dextrose **OR** dextrose, guaiFENesin-dextromethorphan, melatonin, naloxone, ondansetron **OR** [DISCONTINUED] ondansetron, senna-docusate, traZODone    Review of Systems:   Review of systems was: Negative except -shortness of breath.    Physical Exam:   Blood pressure 136/69, pulse 88, temperature 97.2 F (36.2 C), temperature source Temporal, resp. rate 22, height 1.829 m (6' 0.01"), weight 90.4 kg (199 lb 4.7 oz), SpO2 95%.     Intake and Output Summary (Last 24 hours) at Date Time:   Intake/Output Summary (Last 24 hours) at 07/02/2023 1025  Last data filed at 07/02/2023 0946  Gross per 24 hour   Intake 1667.31 ml   Output 2975 ml   Net -1307.69 ml     Intake and Output Summary for yesterday: 09/12 0701 -  09/13 0700  In: 1890.47 [P.O.:550; I.V.:1340.47]  Out: 3350 [Urine:3350]     Mental status - alert, oriented to person, place, and time  Eyes - pupils equal and reactive, extraocular eye movements intact  Ears -  external ear canals normal  Nose - normal and patent, no erythema, discharge or polyps  Mouth - mucous membranes moist, pharynx normal without lesions  Neck - supple, no significant adenopathy, no JVD  Lymphatics - no palpable lymphadenopathy, no hepatosplenomegaly  Chest - diminished to auscultation, no wheezes, rales or rhonchi, symmetric air entry  Heart - normal rate, regular rhythm, normal S1, S2, no murmurs, rubs, clicks or gallops  Abdomen - soft, nontender, nondistended, no masses or organomegaly, bowel sounds present. NO bruits over the renal arteries.  Neurological - awake, alert, oriented x3, normal speech, no focal findings or movement disorder noted  Musculoskeletal - no joint tenderness, deformity or swelling  Extremities - peripheral pulses normal, no pedal edema, no clubbing or cyanosis.  Skin - normal coloration and turgor, no rashes, no suspicious skin lesions noted    Labs:     Results       Procedure Component Value Units Date/Time    Whole Blood Glucose POCT [[id number]]  (Abnormal) Collected: 07/02/23 0942    Specimen: Blood, Capillary Updated: 07/02/23 0948     Whole Blood Glucose POCT 217 mg/dL     Whole Blood Glucose POCT [[id number]]  (Abnormal)  Collected: 07/02/23 0758    Specimen: Blood, Capillary Updated: 07/02/23 0805     Whole Blood Glucose POCT 205 mg/dL     Whole Blood Glucose POCT [[id number]]  (Abnormal) Collected: 07/02/23 0657    Specimen: Blood, Capillary Updated: 07/02/23 0700     Whole Blood Glucose POCT 192 mg/dL     Whole Blood Glucose POCT [[id number]]  (Abnormal) Collected: 07/02/23 0603    Specimen: Blood, Capillary Updated: 07/02/23 0619     Whole Blood Glucose POCT 145 mg/dL     Whole Blood Glucose POCT [[id number]]  (Abnormal) Collected: 07/02/23 0607    Specimen:  Blood, Capillary Updated: 07/02/23 0619     Whole Blood Glucose POCT 174 mg/dL     Co-Oximetry Profile [[id number]]  (Abnormal) Collected: 07/02/23 0603    Specimen: Blood, Venous Updated: 07/02/23 0605     Hematocrit Total Calculated 30.6 %      Hemoglobin Total 10.0 g/dL      Carboxyhemoglobin 1.2 %      Oxygenated Hemoglobin 55.9 %      Methemoglobin 0.5 %      Collection Site Not Specif    Basic Metabolic Panel [[id number]]  (Abnormal) Collected: 07/02/23 0520    Specimen: Blood, Venous Updated: 07/02/23 0545     Glucose 218 mg/dL      BUN 46 mg/dL      Creatinine 3.1 mg/dL      Calcium 8.7 mg/dL      Sodium 244 mEq/L      Potassium 3.9 mEq/L      Chloride 108 mEq/L      CO2 19 mEq/L      Anion Gap 13.0     GFR 20.1 mL/min/1.73 m2     Magnesium [[id number]]  (Normal) Collected: 07/02/23 0520    Specimen: Blood, Venous Updated: 07/02/23 0545     Magnesium 2.5 mg/dL     CBC without Differential [[id number]]  (Abnormal) Collected: 07/02/23 0520    Specimen: Blood, Venous Updated: 07/02/23 0538     WBC 6.70 x10 3/uL      Hemoglobin 9.9 g/dL      Hematocrit 59.5 %      Platelet Count 79 x10 3/uL      MPV --     RBC 3.60 x10 6/uL      MCV 86.9 fL      MCH 27.5 pg      MCHC 31.6 g/dL      RDW 19 %      nRBC % 0.4 /100 WBC      Absolute nRBC 0.03 x10 3/uL     Lactic Acid [[id number]]  (Normal) Collected: 07/02/23 0520    Specimen: Blood, Venous Updated: 07/02/23 0529     Whole Blood Lactic Acid 1.1 mmol/L     Cyanide Level [[id number]] Collected: 07/02/23 0520    Specimen: Blood, Venous Updated: 07/02/23 0526    Whole Blood Glucose POCT [[id number]]  (Abnormal) Collected: 07/02/23 0500    Specimen: Blood, Capillary Updated: 07/02/23 0509     Whole Blood Glucose POCT 191 mg/dL     Whole Blood Glucose POCT [[id number]]  (Abnormal) Collected: 07/02/23 0502    Specimen: Blood, Capillary Updated: 07/02/23 0509     Whole Blood Glucose POCT 188 mg/dL     Whole Blood Glucose POCT [[id number]]  (Abnormal) Collected: 07/02/23 0400     Specimen: Blood, Capillary Updated: 07/02/23 0432     Whole Blood Glucose POCT 162 mg/dL     Whole Blood  Glucose POCT [[id number]]  (Abnormal) Collected: 07/02/23 0300    Specimen: Blood, Capillary Updated: 07/02/23 0302     Whole Blood Glucose POCT 187 mg/dL     Whole Blood Glucose POCT [[id number]]  (Abnormal) Collected: 07/02/23 0155    Specimen: Blood, Capillary Updated: 07/02/23 0157     Whole Blood Glucose POCT 178 mg/dL     Whole Blood Glucose POCT [[id number]]  (Abnormal) Collected: 07/02/23 0041    Specimen: Blood, Capillary Updated: 07/02/23 0043     Whole Blood Glucose POCT 138 mg/dL     Hemoglobin and Hematocrit [[id number]]  (Abnormal) Collected: 07/01/23 2353    Specimen: Blood, Venous Updated: 07/02/23 0022     Hemoglobin 10.0 g/dL      Hematocrit 44.0 %     Basic Metabolic Panel [[id number]]  (Abnormal) Collected: 07/01/23 2353    Specimen: Blood, Venous Updated: 07/02/23 0017     Glucose 136 mg/dL      BUN 49 mg/dL      Creatinine 3.2 mg/dL      Calcium 8.9 mg/dL      Sodium 440 mEq/L      Potassium 3.7 mEq/L      Chloride 108 mEq/L      CO2 21 mEq/L      Anion Gap 11.0     GFR 19.3 mL/min/1.73 m2     Magnesium [[id number]]  (Normal) Collected: 07/01/23 2353    Specimen: Blood, Venous Updated: 07/02/23 0017     Magnesium 2.6 mg/dL     Co-Oximetry Profile [[id number]]  (Abnormal) Collected: 07/02/23 0004    Specimen: Blood, Venous Updated: 07/02/23 0006     Hematocrit Total Calculated 31.3 %      Hemoglobin Total 10.2 g/dL      Carboxyhemoglobin 1.1 %      Oxygenated Hemoglobin 51.5 %      Methemoglobin 0.6 %      Collection Site Not Specif    Lactic Acid [[id number]]  (Normal) Collected: 07/01/23 2353    Specimen: Blood, Venous Updated: 07/02/23 0002     Whole Blood Lactic Acid 1.1 mmol/L     Whole Blood Glucose POCT [[id number]]  (Abnormal) Collected: 07/02/23 0000    Specimen: Blood, Capillary Updated: 07/02/23 0002     Whole Blood Glucose POCT 131 mg/dL     Whole Blood Glucose POCT [[id number]]  (Normal)  Collected: 07/01/23 2308    Specimen: Blood, Capillary Updated: 07/01/23 2318     Whole Blood Glucose POCT 98 mg/dL     Whole Blood Glucose POCT [[id number]]  (Abnormal) Collected: 07/01/23 2157    Specimen: Blood, Capillary Updated: 07/01/23 2201     Whole Blood Glucose POCT 117 mg/dL     Whole Blood Glucose POCT [[id number]]  (Abnormal) Collected: 07/01/23 2112    Specimen: Blood, Capillary Updated: 07/01/23 2116     Whole Blood Glucose POCT 125 mg/dL     Whole Blood Glucose POCT [[id number]]  (Abnormal) Collected: 07/01/23 2010    Specimen: Blood, Capillary Updated: 07/01/23 2020     Whole Blood Glucose POCT 170 mg/dL     Whole Blood Glucose POCT [[id number]]  (Abnormal) Collected: 07/01/23 1904    Specimen: Blood, Capillary Updated: 07/01/23 1909     Whole Blood Glucose POCT 199 mg/dL     Whole Blood Glucose POCT [[id number]]  (Abnormal) Collected: 07/01/23 1715    Specimen: Blood, Capillary Updated: 07/01/23 1909     Whole Blood Glucose POCT 281 mg/dL     Whole Blood Glucose  POCT [[id number]]  (Abnormal) Collected: 07/01/23 1759    Specimen: Blood, Capillary Updated: 07/01/23 1909     Whole Blood Glucose POCT 248 mg/dL     Basic Metabolic Panel [[id number]]  (Abnormal) Collected: 07/01/23 1801    Specimen: Blood, Venous Updated: 07/01/23 1836     Glucose 266 mg/dL      BUN 52 mg/dL      Creatinine 3.4 mg/dL      Calcium 8.6 mg/dL      Sodium 782 mEq/L      Potassium 3.6 mEq/L      Chloride 107 mEq/L      CO2 19 mEq/L      Anion Gap 12.0     GFR 18.0 mL/min/1.73 m2     Magnesium [[id number]]  (Abnormal) Collected: 07/01/23 1801    Specimen: Blood, Venous Updated: 07/01/23 1836     Magnesium 2.7 mg/dL     Lactic Acid [[id number]]  (Abnormal) Collected: 07/01/23 1801    Specimen: Blood, Venous Updated: 07/01/23 1821     Whole Blood Lactic Acid 2.9 mmol/L     Hemoglobin and Hematocrit [[id number]]  (Abnormal) Collected: 07/01/23 1801    Specimen: Blood, Venous Updated: 07/01/23 1820     Hemoglobin 9.9 g/dL      Hematocrit  01.0 %     Co-Oximetry Profile [[id number]]  (Abnormal) Collected: 07/01/23 1811    Specimen: Blood, Venous Updated: 07/01/23 1812     Hematocrit Total Calculated 31.5 %      Hemoglobin Total 10.3 g/dL      Carboxyhemoglobin 1.1 %      Oxygenated Hemoglobin 58.7 %      Methemoglobin 0.7 %      O2 Content 8.5 Vol %      Collection Site Art Line    Whole Blood Glucose POCT [[id number]]  (Abnormal) Collected: 07/01/23 1428    Specimen: Blood, Capillary Updated: 07/01/23 1606     Whole Blood Glucose POCT 364 mg/dL     Whole Blood Glucose POCT [[id number]]  (Abnormal) Collected: 07/01/23 1500    Specimen: Blood, Capillary Updated: 07/01/23 1606     Whole Blood Glucose POCT 302 mg/dL     Whole Blood Glucose POCT [[id number]]  (Abnormal) Collected: 07/01/23 1603    Specimen: Blood, Capillary Updated: 07/01/23 1606     Whole Blood Glucose POCT 328 mg/dL     Co-Oximetry Profile [[id number]]  (Abnormal) Collected: 07/01/23 1314    Specimen: Blood, Venous Updated: 07/01/23 1314     Hematocrit Total Calculated 31.4 %      Hemoglobin Total 10.2 g/dL      Carboxyhemoglobin 1.2 %      Oxygenated Hemoglobin 63.4 %      Methemoglobin 0.5 %      O2 Content 9.1 Vol %      Collection Site Art Line    Co-Oximetry Profile [[id number]]  (Abnormal) Collected: 07/01/23 1253    Specimen: Blood, Venous Updated: 07/01/23 1253     Hematocrit Total Calculated 30.5 %      Hemoglobin Total 10.0 g/dL      Carboxyhemoglobin 0.9 %      Oxygenated Hemoglobin 97.5 %      Methemoglobin 0.6 %      O2 Content 13.8 Vol %      Collection Site Art Line    Basic Metabolic Panel [[id number]]  (Abnormal) Collected: 07/01/23 1151    Specimen: Blood, Venous Updated: 07/01/23 1227     Glucose 582 mg/dL  BUN 51 mg/dL      Creatinine 3.5 mg/dL      Calcium 8.3 mg/dL      Sodium 956 mEq/L      Potassium 3.7 mEq/L      Chloride 98 mEq/L      CO2 21 mEq/L      Anion Gap 12.0     GFR 17.3 mL/min/1.73 m2     Magnesium [[id number]]  (Normal) Collected: 07/01/23 1151     Specimen: Blood, Venous Updated: 07/01/23 1222     Magnesium 2.4 mg/dL     Venous Blood Gas [[id number]]  (Abnormal) Collected: 07/01/23 1205    Specimen: Venous Blood from Blood, Venous Updated: 07/01/23 1205     Venous pH 7.379     Venous PCO2 39.6 mmHg      Venous PO2 35.1 mmHg      Venous HCO3 23.3 mEq/L      Venous Total CO2 24.6 mEq/L      Venous Base Excess -1.7 mEq/L      Venous O2 Saturation 60.1 %      Temperature 37.0 C      Collection Site Venous     O2 Delivery Nasal Cannula     O2 Liter Flow 3.0 L/min      Status Oxygen    Lactic Acid [[id number]]  (Normal) Collected: 07/01/23 1151    Specimen: Blood, Venous Updated: 07/01/23 1200     Whole Blood Lactic Acid 1.4 mmol/L     Hemoglobin and Hematocrit [[id number]]  (Abnormal) Collected: 07/01/23 1151    Specimen: Blood, Venous Updated: 07/01/23 1159     Hemoglobin 9.5 g/dL      Hematocrit 20.2 %     Whole Blood Glucose POCT [[id number]]  (Abnormal) Collected: 07/01/23 1142    Specimen: Blood, Capillary Updated: 07/01/23 1147     Whole Blood Glucose POCT 379 mg/dL             Labs (last 72 hours):  Recent Labs     07/02/23  0520 07/01/23  2353 07/01/23  1801 07/01/23  1151 07/01/23  0538 06/30/23  2335 06/30/23  0535   WBC 6.70  --   --   --  7.83  --  10.97*   Hemoglobin 9.9* 10.0* 9.9*   < > 10.1*   < > 11.4*   Hematocrit 31.3* 31.1* 31.2*   < > 32.2*   < > 37.1*    < > = values in this interval not displayed.     Recent Labs     06/29/23  1914   PTT 27    Recent Labs     07/02/23  0520 07/01/23  2353 07/01/23  1801 07/01/23  1151 07/01/23  0532   Sodium 140 140 138   < > 133*   Potassium 3.9 3.7 3.6   < > 3.8   Chloride 108 108 107   < > 103   CO2 19 21 19    < > 18   BUN 46* 49* 52*   < > 54*   Creatinine 3.1* 3.2* 3.4*   < > 3.5*   Glucose 218* 136* 266*   < > 406*   Calcium 8.7 8.9 8.6   < > 8.7   Magnesium 2.5 2.6 2.7*   < > 2.4   Phosphorus  --   --   --   --  4.0    < > = values in this interval not displayed.  Rads:     Radiology  Results (24 Hour)       Procedure Component Value Units Date/Time    XR Chest AP Portable [[id number]] Resulted: 07/02/23 1004    Order Status: Sent Updated: 07/02/23 1004              Assessment:   ARF - due to cardiorenal syndrome.  In spite of significant diuresis Weg goes up.  The kidney function remains stable having current diuresis of 250>123 cc/h.  Continue Bumex drips at 1 mg/h as long as kidney function continues to improve.  When creatinine stops improving will change Bumex to shots around-the-clock.  CKD 3B - the baseline creatinine level is 1.7 in April of this year.  Not sure if the kidney function can return back to the baseline.  CHF - the cardiac function is better (weg 17>15).  The medical management is optimized.    Hyponatremia - due to volume overload. Resolved. Continue current diuresis.  Diabetes type 2 - on sliding scale insulin.    Plan:   Bumex drip at 1 mg/h  Amiodarone drips  Dobutamine drips  Nitroprusside drips  CBC, BMP daily.    Spoke to Dr. Webb Silversmith regarding the medical management.  We will look remotely at the patient tomorrow and if necessary we will discuss the changes of management.    Time spent for evaluation, management and coordination of care:  40 minutes      Signed by: Toula Moos, MD, MD  [phone], office  [phone], pager

## 2023-07-02 NOTE — Plan of Care (Signed)
Problem: Pain interferes with ability to perform ADL  Goal: Pain at adequate level as identified by patient  Outcome: Progressing     Problem: Compromised Moisture  Goal: Moisture level Interventions  Outcome: Progressing     Problem: Compromised Activity/Mobility  Goal: Activity/Mobility Interventions  Outcome: Progressing     Problem: Compromised Sensory Perception  Goal: Sensory Perception Interventions  Outcome: Progressing     Problem: Compromised Nutrition  Goal: Nutrition Interventions  Outcome: Progressing     Problem: Compromised Friction/Shear  Goal: Friction and Shear Interventions  Outcome: Progressing     Problem: Moderate/High Fall Risk Score >5  Goal: Patient will remain free of falls  Outcome: Progressing     Problem: Day of Admission - Heart Failure  Goal: Heart Failure Admission  Outcome: Progressing     Problem: Everyday - Heart Failure  Goal: Stable Vital Signs and Fluid Balance  Outcome: Progressing  Goal: Mobility/Activity is Maintained at Optimal Level for Patient  Outcome: Progressing  Goal: Nutritional Intake is Adequate  Outcome: Progressing     Problem: Day of Discharge - Heart Failure  Goal: Discharge Education  Outcome: Progressing     Problem: Hemodynamic Status: Cardiac  Goal: Stable vital signs and fluid balance  Outcome: Progressing     Problem: Inadequate Tissue Perfusion  Goal: Adequate tissue perfusion will be maintained  Outcome: Progressing

## 2023-07-03 ENCOUNTER — Inpatient Hospital Stay: Payer: Commercial Managed Care - POS

## 2023-07-03 LAB — MAGNESIUM
Magnesium: 2.4 mg/dL (ref 1.6–2.6)
Magnesium: 2.4 mg/dL (ref 1.6–2.6)
Magnesium: 2.5 mg/dL (ref 1.6–2.6)
Magnesium: 2.5 mg/dL (ref 1.6–2.6)

## 2023-07-03 LAB — BASIC METABOLIC PANEL
Anion Gap: 10 (ref 5.0–15.0)
Anion Gap: 11 (ref 5.0–15.0)
Anion Gap: 11 (ref 5.0–15.0)
Anion Gap: 12 (ref 5.0–15.0)
BUN: 48 mg/dL — ABNORMAL HIGH (ref 9–28)
BUN: 48 mg/dL — ABNORMAL HIGH (ref 9–28)
BUN: 49 mg/dL — ABNORMAL HIGH (ref 9–28)
BUN: 51 mg/dL — ABNORMAL HIGH (ref 9–28)
CO2: 19 meq/L (ref 17–29)
CO2: 20 meq/L (ref 17–29)
CO2: 20 meq/L (ref 17–29)
CO2: 21 meq/L (ref 17–29)
Calcium: 8.4 mg/dL (ref 7.9–10.2)
Calcium: 8.4 mg/dL (ref 7.9–10.2)
Calcium: 8.7 mg/dL (ref 7.9–10.2)
Calcium: 8.9 mg/dL (ref 7.9–10.2)
Chloride: 105 meq/L (ref 99–111)
Chloride: 105 meq/L (ref 99–111)
Chloride: 106 meq/L (ref 99–111)
Chloride: 107 meq/L (ref 99–111)
Creatinine: 3.2 mg/dL — ABNORMAL HIGH (ref 0.5–1.5)
Creatinine: 3.2 mg/dL — ABNORMAL HIGH (ref 0.5–1.5)
Creatinine: 3.4 mg/dL — ABNORMAL HIGH (ref 0.5–1.5)
Creatinine: 3.4 mg/dL — ABNORMAL HIGH (ref 0.5–1.5)
GFR: 18 mL/min/{1.73_m2} — ABNORMAL LOW (ref >=60.0)
GFR: 18 mL/min/{1.73_m2} — ABNORMAL LOW (ref >=60.0)
GFR: 19.3 mL/min/{1.73_m2} — ABNORMAL LOW (ref >=60.0)
GFR: 19.3 mL/min/{1.73_m2} — ABNORMAL LOW (ref >=60.0)
Glucose: 173 mg/dL — ABNORMAL HIGH (ref 70–100)
Glucose: 268 mg/dL — ABNORMAL HIGH (ref 70–100)
Glucose: 273 mg/dL — ABNORMAL HIGH (ref 70–100)
Glucose: 274 mg/dL — ABNORMAL HIGH (ref 70–100)
Potassium: 3.9 meq/L (ref 3.5–5.3)
Potassium: 4.1 meq/L (ref 3.5–5.3)
Potassium: 4.2 meq/L (ref 3.5–5.3)
Potassium: 4.3 meq/L (ref 3.5–5.3)
Sodium: 135 meq/L (ref 135–145)
Sodium: 136 meq/L (ref 135–145)
Sodium: 136 meq/L (ref 135–145)
Sodium: 140 meq/L (ref 135–145)

## 2023-07-03 LAB — HEPATIC FUNCTION PANEL (LFT)
ALT: 35 U/L (ref 0–55)
AST (SGOT): 22 U/L (ref 5–41)
Albumin/Globulin Ratio: 1.1 (ref 0.9–2.2)
Albumin: 3.3 g/dL — ABNORMAL LOW (ref 3.5–5.0)
Alkaline Phosphatase: 133 U/L — ABNORMAL HIGH (ref 37–117)
Bilirubin Direct: 0.8 mg/dL — ABNORMAL HIGH (ref 0.0–0.5)
Bilirubin Indirect: 0.5 mg/dL (ref 0.2–1.0)
Bilirubin, Total: 1.3 mg/dL — ABNORMAL HIGH (ref 0.2–1.2)
Globulin: 3.1 g/dL (ref 2.0–3.6)
Protein, Total: 6.4 g/dL (ref 6.0–8.3)

## 2023-07-03 LAB — WHOLE BLOOD GLUCOSE POCT
Whole Blood Glucose POCT: 172 mg/dL — ABNORMAL HIGH (ref 70–100)
Whole Blood Glucose POCT: 223 mg/dL — ABNORMAL HIGH (ref 70–100)
Whole Blood Glucose POCT: 250 mg/dL — ABNORMAL HIGH (ref 70–100)
Whole Blood Glucose POCT: 295 mg/dL — ABNORMAL HIGH (ref 70–100)

## 2023-07-03 LAB — CBC
Absolute nRBC: 0.06 10*3/uL — ABNORMAL HIGH (ref <=0.00)
Hematocrit: 30.5 % — ABNORMAL LOW (ref 37.6–49.6)
Hemoglobin: 9.8 g/dL — ABNORMAL LOW (ref 12.5–17.1)
MCH: 28 pg (ref 25.1–33.5)
MCHC: 32.1 g/dL (ref 31.5–35.8)
MCV: 87.1 fL (ref 78.0–96.0)
Platelet Count: 70 10*3/uL — ABNORMAL LOW (ref 142–346)
RBC: 3.5 10*6/uL — ABNORMAL LOW (ref 4.20–5.90)
RDW: 19 % — ABNORMAL HIGH (ref 11–15)
WBC: 6.82 10*3/uL (ref 3.10–9.50)
nRBC %: 0.9 /100{WBCs} — ABNORMAL HIGH (ref <=0.0)

## 2023-07-03 LAB — COOXIMETRY PROFILE
Carboxyhemoglobin: 1.1 % (ref 0.4–2.7)
Carboxyhemoglobin: 1.1 % (ref 0.4–2.7)
Carboxyhemoglobin: 1.2 % (ref 0.4–2.7)
Carboxyhemoglobin: 1.2 % (ref 0.4–2.7)
Hematocrit Total Calculated: 30.4 % — ABNORMAL LOW (ref 40.0–54.0)
Hematocrit Total Calculated: 31.1 % — ABNORMAL LOW (ref 40.0–54.0)
Hematocrit Total Calculated: 31.5 % — ABNORMAL LOW (ref 40.0–54.0)
Hematocrit Total Calculated: 31.6 % — ABNORMAL LOW (ref 40.0–54.0)
Hemoglobin Total: 10.1 g/dL — ABNORMAL LOW (ref 13.0–17.0)
Hemoglobin Total: 10.3 g/dL — ABNORMAL LOW (ref 13.0–17.0)
Hemoglobin Total: 10.3 g/dL — ABNORMAL LOW (ref 13.0–17.0)
Hemoglobin Total: 9.9 g/dL — ABNORMAL LOW (ref 13.0–17.0)
Methemoglobin: 0.6 % (ref 0.0–1.5)
Methemoglobin: 0.7 % (ref 0.0–1.5)
Methemoglobin: 0.7 % (ref 0.0–1.5)
Methemoglobin: 0.7 % (ref 0.0–1.5)
Oxygenated Hemoglobin: 40.3 % — ABNORMAL LOW (ref 94.0–98.0)
Oxygenated Hemoglobin: 43.4 % — ABNORMAL LOW (ref 94.0–98.0)
Oxygenated Hemoglobin: 46 % — ABNORMAL LOW (ref 94.0–98.0)
Oxygenated Hemoglobin: 52.4 % — ABNORMAL LOW (ref 94.0–98.0)

## 2023-07-03 LAB — HEMOGLOBIN AND HEMATOCRIT
Hematocrit: 30.5 % — ABNORMAL LOW (ref 37.6–49.6)
Hematocrit: 30.9 % — ABNORMAL LOW (ref 37.6–49.6)
Hematocrit: 32 % — ABNORMAL LOW (ref 37.6–49.6)
Hemoglobin: 10 g/dL — ABNORMAL LOW (ref 12.5–17.1)
Hemoglobin: 10 g/dL — ABNORMAL LOW (ref 12.5–17.1)
Hemoglobin: 9.7 g/dL — ABNORMAL LOW (ref 12.5–17.1)

## 2023-07-03 LAB — LACTIC ACID
Whole Blood Lactic Acid: 1.2 mmol/L (ref 0.2–2.0)
Whole Blood Lactic Acid: 1.2 mmol/L (ref 0.2–2.0)
Whole Blood Lactic Acid: 1.3 mmol/L (ref 0.2–2.0)
Whole Blood Lactic Acid: 1.5 mmol/L (ref 0.2–2.0)

## 2023-07-03 MED ORDER — METOLAZONE 5 MG PO TABS
2.5000 mg | ORAL_TABLET | Freq: Once | ORAL | Status: AC
Start: 2023-07-03 — End: 2023-07-03
  Administered 2023-07-03: 2.5 mg via ORAL
  Filled 2023-07-03: qty 1

## 2023-07-03 MED ORDER — HYDRALAZINE HCL 25 MG PO TABS
75.0000 mg | ORAL_TABLET | Freq: Three times a day (TID) | ORAL | Status: DC
Start: 2023-07-03 — End: 2023-07-07
  Administered 2023-07-03 – 2023-07-07 (×13): 75 mg via ORAL
  Filled 2023-07-03 (×13): qty 3

## 2023-07-03 NOTE — Progress Notes (Signed)
CRITICAL CARE  [facility]   [facility]- Critical Care Note     ICU Daily Progress Note        Date Time: 07/03/23 11:38 AM  Patient Name: [name] [name]  Attending Physician: Lawernce Ion, MD  Room: IC10/IC10-A   Admit Date: 06/29/2023  LOS: 3 days      Assessment/Plan:   #Neuro: Alert, interactive, no signs of confusion    # Palliative care: Palliative care consulted for advanced care planning.     #Cardio: Acute on chronic cardiogenic shock  Appreciate advanced heart failure service Dr. Webb Silversmith as well as [facility] general cardiologist evaluation recommendation. Cont dobutamine drip to 5 mcg/kg/min.  Not a candidate for advanced heart failure therapy with LVAD given comorbidities.  Ventricular ectopy, tolerating lower dose of amiodarone infusion@0 .5  off nipride infusion. Now on hydralazine for afterload reduction.    Coronary artery disease  History of hypertension, currently not tolerating goal-directed medical therapy in the setting of acute decompensated heart failure.    Nonischemic cardiomyopathy, ejection fraction 15%, biventricular AICD  Severe functional tricuspid regurgitation, severe mitral regurgitation, when patient is compensated he may reconsider mitral clip as an outpatient    Acute on chronic systolic congestive heart failure, continue with Bumex infusion  Swan-Ganz catheter guided therapy    D/c aspirin for low platelet    #Resp: Dyspnea related to congestive heart failure.  Improved with diuresis.    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  Appreciate [facility] evaluation recommendation.  Resolved Lactic acidosis, improved with dobutamine infusion  Continue Bumex infusion to decrease CVP less than 10    #Infectious Disease (ID): No indication of infection at  this time, will closely monitor.  Afebrile, no leukocytosis           #Hem/Onc: Anemia of chronic disease  Remote history of CLL    #Endocrine: Diabetes mellitus, continue Lantus and high-dose sliding scale insulin     #Prophylaxis:   VTE Prophylaxis:hold for low platelet      #Code Status: full code        Subjective:     Alert, interactive, no chest pain, he appears to be in good spirits today.  He states that he has no shortness of breath.  He states he feels "pretty good".    Medications:   Scheduled Meds:  Current Facility-Administered Medications   Medication Dose Route Frequency    atorvastatin  80 mg Oral Daily    hydrALAZINE  50 mg Oral Q8H SCH    insulin glargine  15 Units Subcutaneous QHS    insulin glargine  15 Units Subcutaneous QAM    insulin lispro  1-6 Units Subcutaneous QHS    insulin lispro  2-10 Units Subcutaneous TID AC    pantoprazole  40 mg Oral QAM AC         Continuous Infusions:   amiodarone 0.5 mg/min (07/03/23 0700)    bumetanide 1 mg/hr (07/03/23 0700)    DOBUTamine 5 mcg/kg/min (07/03/23 0700)    nitroprusside Stopped (07/03/23 0051)        Physical Exam:     Vitals:    07/03/23 1030   BP:    Pulse: 78   Resp: 19   Temp:  SpO2: 97%         Intake/Output Summary (Last 24 hours) at 07/03/2023 1138  Last data filed at 07/03/2023 0854  Gross per 24 hour   Intake 2342.2 ml   Output 1100 ml   Net 1242.2 ml       General Appearance: Younger than stated age, very pleasant, alert and interactive and not confused, Swan-Ganz catheter in the right neck is intact and functioning  CV: ns1, ns2, 2/6 systolic ejection murmur in the right sternal border, no rubs no gallop  Pulm: Mild bibasilar crackles  Abd: Nontender, nondistended, positive bowel sounds  Extremities: Warm lower extremities, +2 dorsalis pedis pulses, +2 pitting edema    Data:   Invasive ICU Hemodynamics:    Invasive Hemodynamic Monitoring  Hemodynamic Monitoring Additional Assessment: Yes  CVP (mmHg): 21 mmHg  PAP: 45/19  PAP (Mean): 31  mmHg  Cardiac Power Output: 0.68  Pulmonary Artery Pulsatility Index (PAPI): 1.24    Arterial Line  Arterial Line BP: 108/68  Arterial Line MAP (mmHg): 82 mmHg  Arterial Line Location: Left radial  Arterial Line Wave Form: Appropriate wave forms, Whip, Rezeroed    Labs:   Labs (last 72 hours):  Recent Labs     07/03/23  0549 07/02/23  2354 07/02/23  0520 07/01/23  1151 07/01/23  0538   WBC 6.82  --  6.70  --  7.83   Hemoglobin 9.8* 10.0* 9.9*   < > 10.1*   Hematocrit 30.5* 30.9* 31.3*   < > 32.2*    < > = values in this interval not displayed.     No results for input(s): "PT", "INR", "PTT" in the last 72 hours.   Recent Labs     07/03/23  0549 07/02/23  2354 07/02/23  1811 07/01/23  1151 07/01/23  0532   Sodium 140 136 138   < > 133*   Potassium 3.9 4.1 4.4   < > 3.8   Chloride 107 106 105   < > 103   CO2 21 20 21    < > 18   BUN 48* 49* 49*   < > 54*   Creatinine 3.2* 3.4* 3.3*   < > 3.5*   Glucose 173* 274* 369*   < > 406*   Calcium 8.7 8.9 8.6   < > 8.7   Magnesium 2.5 2.5 2.6   < > 2.4   Phosphorus  --   --   --   --  4.0    < > = values in this interval not displayed.               Rads:   Radiological Imaging personally reviewed,and agree with radiology report including:     XR Chest AP Portable    Result Date: 07/02/2023   PA catheter tip in good position. Mild peribronchial wall thickening centrally may indicate interstitial inflammation infection or congestion. No overt pulmonary edema. Charlott Rakes, MD 07/02/2023 11:48 AM    X-ray chest AP portable    Result Date: 07/01/2023  Right IJ Swan-Ganz catheter tip overlies the right pulmonary artery. Nonda Lou, MD 07/01/2023 7:47 AM    XR Chest AP Portable    Result Date: 06/30/2023  1. Pulmonary artery catheter tip at the proximal right pulmonary artery. 2. CHF. Nelta Numbers, MD 06/30/2023 6:43 PM    Chest AP Portable    Result Date: 06/29/2023   No acute findings. Wynema Birch, MD 06/29/2023 5:31 PM  Critical care provider statement:   Critical care time  (minutes):  76   Critical care was necessary to treat or prevent imminent or life-threatening deterioration of the following conditions: Acute on chronic decompensated systolic congestive heart failure, cardiogenic shock, nonischemic cardiomyopathy, acute kidney injury      Critical care was time spent personally by me on the following activities:  Development of treatment plan with patient or surrogate, discussions with consultants, examination of patient, re-evaluation of patient's condition, pulse oximetry, ordering and review of radiographic studies, ordering and review of laboratory studies and ordering and performing treatments and interventions       Signed by: Lawernce Ion, MD, MD  Date/Time: 07/03/23 11:38 AM           *This note was generated by the [employer] EMR system/ Dragon speech recognition and may contain inherent errors or omissions not intended by the user. Grammatical errors, random word insertions, deletions, pronoun errors and incomplete sentences are occasional consequences of this technology due to software limitations. Not all errors are caught or corrected. If there are questions or concerns about the content of this note or information contained within the body of this dictation they should be addressed directly with the author for clarification

## 2023-07-03 NOTE — Progress Notes (Signed)
07/03/23 0600   VO2 Determination   Height 1.829 m (6' 0.01")   Weight FICK (only) 90.4 kg (199 lb 4.7 oz)   BSA (Fick only) (Calculated - sq m) 2.14 sq meters   VO2 267.5   CO/CI Determination   SaO2  0.98   SvO2  0.46   SaO2 - SvO2 0.52   HGB Result 9.8   x 1.36 x 10 69.31   Cardiac Output (FICK) 3.86   Cardiac Index (FICK) 1.8   Cardiac Power Output 0.68   PVR Determination   PAP 52/23   Pulmonary Artery Pulsatility Index (PAPI) 1.26   SVR Determination   MAP (mmHg) 79   CVP (mmHg) 23 mmHg   MAP - CVP 56   SVR (dynes/second/cm3) 1160.62 dynes/second/cm3     Gtts currently infusing: Dobutamine @ 5, Bumex @ 1, Amio @ 0.5. Provider made aware.

## 2023-07-03 NOTE — Progress Note - Problem Oriented Charting Notewrit (Signed)
PROGRESS NOTE      Date Time: 07/03/23 11:57 AM  Patient Name: [name] [name], [name] [name]    Chief Complaint:   ARF. CKD 3b.    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  Appeared to be in decompensated CHF and started on dobutamine drips, nitroprusside drips.  Responded well.  Will follow the patient in the office therefore we have been asked to follow the patient in the hospital.    Less short of breath.  Denies chest pain.  Denies abdominal pain.  No fluid retention lower extremities.    Medications:   Scheduled Meds:  Current Facility-Administered Medications   Medication Dose Route Frequency    atorvastatin  80 mg Oral Daily    hydrALAZINE  50 mg Oral Q8H SCH    insulin glargine  15 Units Subcutaneous QHS    insulin glargine  15 Units Subcutaneous QAM    insulin lispro  1-6 Units Subcutaneous QHS    insulin lispro  2-10 Units Subcutaneous TID AC    pantoprazole  40 mg Oral QAM AC     Continuous Infusions:   amiodarone 0.5 mg/min (07/03/23 0700)    bumetanide 1 mg/hr (07/03/23 0700)    DOBUTamine 5 mcg/kg/min (07/03/23 0700)    nitroprusside Stopped (07/03/23 0051)     PRN Meds:.acetaminophen, alum & mag hydroxide-simethicone, benzonatate, dextrose **OR** dextrose **OR** dextrose **OR** [DISCONTINUED] glucagon (rDNA), guaiFENesin-dextromethorphan, melatonin, naloxone, ondansetron **OR** [DISCONTINUED] ondansetron, senna-docusate, traZODone    Review of Systems:   Review of systems was: Negative except -shortness of breath.    Physical Exam:   Blood pressure 107/71, pulse 78, temperature 97 F (36.1 C), temperature source Temporal, resp. rate 19, height 1.829 m (6' 0.01"), weight 85.7 kg (188 lb 15 oz), SpO2 97%.     Intake and Output Summary (Last 24 hours) at Date Time:   Intake/Output Summary (Last 24 hours) at 07/03/2023 1157  Last  data filed at 07/03/2023 0854  Gross per 24 hour   Intake 2342.2 ml   Output 1100 ml   Net 1242.2 ml     Intake and Output Summary for yesterday: 09/13 0701 - 09/14 0700  In: 2495.73 [P.O.:1363; I.V.:1132.73]  Out: 1250 [Urine:1250]     Mental status - alert, oriented to person, place, and time  Eyes - pupils equal and reactive, extraocular eye movements intact  Ears -  external ear canals normal  Nose - normal and patent, no erythema, discharge or polyps  Mouth - mucous membranes moist, pharynx normal without lesions  Neck - supple, no significant adenopathy, no JVD  Lymphatics - no palpable lymphadenopathy, no hepatosplenomegaly  Chest - diminished to auscultation, no wheezes, rales or rhonchi, symmetric air entry  Heart - normal rate, regular rhythm, normal S1, S2, no murmurs, rubs, clicks or gallops  Abdomen - soft, nontender, nondistended, no masses or organomegaly, bowel sounds present. NO bruits over the renal arteries.  Neurological - awake, alert, oriented x3, normal speech, no focal findings or movement disorder noted  Musculoskeletal - no joint tenderness, deformity or swelling  Extremities - peripheral pulses normal, no pedal edema, no clubbing or cyanosis.  Skin - normal coloration and turgor, no rashes, no suspicious skin lesions noted    Labs:     Results       Procedure Component Value Units Date/Time    Whole  Blood Glucose POCT [[id number]]  (Abnormal) Collected: 07/03/23 1149    Specimen: Blood, Capillary Updated: 07/03/23 1153     Whole Blood Glucose POCT 223 mg/dL     Whole Blood Glucose POCT [[id number]]  (Abnormal) Collected: 07/03/23 0757    Specimen: Blood, Capillary Updated: 07/03/23 0802     Whole Blood Glucose POCT 172 mg/dL     Basic Metabolic Panel [[id number]]  (Abnormal) Collected: 07/03/23 0549    Specimen: Blood, Venous Updated: 07/03/23 0614     Glucose 173 mg/dL      BUN 48 mg/dL      Creatinine 3.2 mg/dL      Calcium 8.7 mg/dL      Sodium 725 mEq/L      Potassium 3.9 mEq/L       Chloride 107 mEq/L      CO2 21 mEq/L      Anion Gap 12.0     GFR 19.3 mL/min/1.73 m2     Magnesium [[id number]]  (Normal) Collected: 07/03/23 0549    Specimen: Blood, Venous Updated: 07/03/23 0614     Magnesium 2.5 mg/dL     CBC without Differential [[id number]]  (Abnormal) Collected: 07/03/23 0549    Specimen: Blood, Venous Updated: 07/03/23 0609     WBC 6.82 x10 3/uL      Hemoglobin 9.8 g/dL      Hematocrit 56.4 %      Platelet Count 70 x10 3/uL      MPV --     RBC 3.50 x10 6/uL      MCV 87.1 fL      MCH 28.0 pg      MCHC 32.1 g/dL      RDW 19 %      nRBC % 0.9 /100 WBC      Absolute nRBC 0.06 x10 3/uL     Lactic Acid [[id number]]  (Normal) Collected: 07/03/23 0549    Specimen: Blood, Venous Updated: 07/03/23 0600     Whole Blood Lactic Acid 1.2 mmol/L     Co-Oximetry Profile [[id number]]  (Abnormal) Collected: 07/03/23 0555    Specimen: Blood, Venous Updated: 07/03/23 0556     Hematocrit Total Calculated 31.1 %      Hemoglobin Total 10.1 g/dL      Carboxyhemoglobin 1.1 %      Oxygenated Hemoglobin 46.0 %      Methemoglobin 0.7 %      Collection Site Not Specif    Cyanide Level [[id number]] Collected: 07/03/23 0549    Specimen: Blood, Venous Updated: 07/03/23 0555    Basic Metabolic Panel [[id number]]  (Abnormal) Collected: 07/02/23 2354    Specimen: Blood, Venous Updated: 07/03/23 0027     Glucose 274 mg/dL      BUN 49 mg/dL      Creatinine 3.4 mg/dL      Calcium 8.9 mg/dL      Sodium 706 mEq/L      Potassium 4.1 mEq/L      Chloride 106 mEq/L      CO2 20 mEq/L      Anion Gap 10.0     GFR 18.0 mL/min/1.73 m2     Magnesium [[id number]]  (Normal) Collected: 07/02/23 2354    Specimen: Blood, Venous Updated: 07/03/23 0027     Magnesium 2.5 mg/dL     Hemoglobin and Hematocrit [[id number]]  (Abnormal) Collected: 07/02/23 2354    Specimen: Blood, Venous Updated: 07/03/23 0020     Hemoglobin 10.0 g/dL      Hematocrit 10.6 %  Lactic Acid [[id number]]  (Normal) Collected: 07/02/23 2354    Specimen: Blood, Venous Updated: 07/03/23  0007     Whole Blood Lactic Acid 1.5 mmol/L     Co-Oximetry Profile [[id number]]  (Abnormal) Collected: 07/03/23 0005    Specimen: Blood, Venous Updated: 07/03/23 0006     Hematocrit Total Calculated 30.4 %      Hemoglobin Total 9.9 g/dL      Carboxyhemoglobin 1.2 %      Oxygenated Hemoglobin 52.4 %      Methemoglobin 0.7 %      Collection Site Not Specif    Whole Blood Glucose POCT [[id number]]  (Abnormal) Collected: 07/02/23 2107    Specimen: Blood, Capillary Updated: 07/02/23 2111     Whole Blood Glucose POCT 337 mg/dL     Basic Metabolic Panel [[id number]]  (Abnormal) Collected: 07/02/23 1811    Specimen: Blood, Venous Updated: 07/02/23 1841     Glucose 369 mg/dL      BUN 49 mg/dL      Creatinine 3.3 mg/dL      Calcium 8.6 mg/dL      Sodium 638 mEq/L      Potassium 4.4 mEq/L      Chloride 105 mEq/L      CO2 21 mEq/L      Anion Gap 12.0     GFR 18.6 mL/min/1.73 m2     Magnesium [[id number]]  (Normal) Collected: 07/02/23 1811    Specimen: Blood, Venous Updated: 07/02/23 1841     Magnesium 2.6 mg/dL     Lactic Acid [[id number]]  (Normal) Collected: 07/02/23 1811    Specimen: Blood, Venous Updated: 07/02/23 1823     Whole Blood Lactic Acid 1.6 mmol/L     Co-Oximetry Profile [[id number]]  (Abnormal) Collected: 07/02/23 1810    Specimen: Blood, Venous Updated: 07/02/23 1811     Hematocrit Total Calculated 32.8 %      Hemoglobin Total 10.7 g/dL      Carboxyhemoglobin 1.2 %      Oxygenated Hemoglobin 44.0 %      Methemoglobin 0.7 %      Collection Site Mixed    Whole Blood Glucose POCT [[id number]]  (Abnormal) Collected: 07/02/23 1609    Specimen: Blood, Capillary Updated: 07/02/23 1612     Whole Blood Glucose POCT 317 mg/dL     Basic Metabolic Panel [[id number]]  (Abnormal) Collected: 07/02/23 1200    Specimen: Blood, Venous Updated: 07/02/23 1241     Glucose 214 mg/dL      BUN 47 mg/dL      Creatinine 3.1 mg/dL      Calcium 8.5 mg/dL      Sodium 762 mEq/L      Potassium 4.4 mEq/L      Chloride 107 mEq/L      CO2 21 mEq/L       Anion Gap 12.0     GFR 20.1 mL/min/1.73 m2     Magnesium [[id number]]  (Normal) Collected: 07/02/23 1200    Specimen: Blood, Venous Updated: 07/02/23 1241     Magnesium 2.6 mg/dL     Lactic Acid [[id number]]  (Normal) Collected: 07/02/23 1200    Specimen: Blood, Venous Updated: 07/02/23 1212     Whole Blood Lactic Acid 2.0 mmol/L     Whole Blood Glucose POCT [[id number]]  (Abnormal) Collected: 07/02/23 1204    Specimen: Blood, Capillary Updated: 07/02/23 1210     Whole Blood Glucose POCT 217 mg/dL  Labs (last 72 hours):  Recent Labs     07/03/23  0549 07/02/23  2354 07/02/23  0520 07/01/23  1151 07/01/23  0538   WBC 6.82  --  6.70  --  7.83   Hemoglobin 9.8* 10.0* 9.9*   < > 10.1*   Hematocrit 30.5* 30.9* 31.3*   < > 32.2*    < > = values in this interval not displayed.     No results for input(s): "PT", "INR", "PTT" in the last 72 hours.   Recent Labs     07/03/23  0549 07/02/23  2354 07/02/23  1811 07/01/23  1151 07/01/23  0532   Sodium 140 136 138   < > 133*   Potassium 3.9 4.1 4.4   < > 3.8   Chloride 107 106 105   < > 103   CO2 21 20 21    < > 18   BUN 48* 49* 49*   < > 54*   Creatinine 3.2* 3.4* 3.3*   < > 3.5*   Glucose 173* 274* 369*   < > 406*   Calcium 8.7 8.9 8.6   < > 8.7   Magnesium 2.5 2.5 2.6   < > 2.4   Phosphorus  --   --   --   --  4.0    < > = values in this interval not displayed.                   Rads:     Radiology Results (24 Hour)       Procedure Component Value Units Date/Time    XR Chest AP Portable [[id number]] Resulted: 07/03/23 0946    Order Status: Sent Updated: 07/03/23 1047              Assessment:   ARF - due to cardiorenal syndrome.  In spite of significant diuresis Weg goes up.  The kidney function remains stable having current diuresis of 250>123 cc/h.  Continue Bumex drips at 1 mg/h as long as kidney function continues to improve.  When creatinine stops improving will change Bumex to shots around-the-clock.  CKD 3B - the baseline creatinine level is 1.7 in April of this  year.  Not sure if the kidney function can return back to the baseline.  CHF - the cardiac function is better (weg 17>15).  The medical management is optimized.    Hyponatremia - due to volume overload. Resolved. Continue current diuresis.  Diabetes type 2 - on sliding scale insulin.    Plan:   Bumex drip at 1 mg/h  Amiodarone drips  Dobutamine drips  Nitroprusside drips  CBC, BMP daily.  Cr 3.3 stable     Time spent for evaluation, management and coordination of care:  40 minutes      Signed by: Irma Newness, MD, MD  [phone], office  [phone], pager

## 2023-07-03 NOTE — Progress Notes (Signed)
07/03/23 1226   VO2 Determination   Height 1.829 m (6')   Weight FICK (only) 90.4 kg (199 lb 4.7 oz)   BSA (Fick only) (Calculated - sq m) 2.14 sq meters   VO2 267.5   CO/CI Determination   SaO2  0.98   SvO2  0.43   SaO2 - SvO2 0.55   HGB Result 9.9   x 1.36 x 10 74.05   Cardiac Output (FICK) 3.61   Cardiac Index (FICK) 1.69   Cardiac Power Output 0.57   PVR Determination   PAP 49/20   PAP (Mean) 32 mmHg   Pulmonary Artery Pulsatility Index (PAPI) 1.45   SVR Determination   MAP (mmHg) 71   CVP (mmHg) 20 mmHg   MAP - CVP 51   SVR (dynes/second/cm3) 1130.19 dynes/second/cm3     MD notified of results

## 2023-07-03 NOTE — Plan of Care (Signed)
Problem: Everyday - Heart Failure  Goal: Stable Vital Signs and Fluid Balance  Outcome: Progressing  Flowsheets (Taken 07/02/2023 0917 by Lawerance Cruel, RN)  Stable Vital Signs and Fluid Balance:   Daily Standing Weights in the morning using the same scale, after using the bathroom and before breadfast.  If unable to stand, zero the bed and use the bed scale   Monitor, assess vital signs and telemetry per policy   Monitor labs and report abnormalities to physician   Assess for swelling/edema   Fluid Restriction   Wean oxygen as needed if appropriate   Strict Intake/Output  Goal: Mobility/Activity is Maintained at Optimal Level for Patient  Outcome: Progressing  Flowsheets (Taken 07/02/2023 0917 by Lawerance Cruel, RN)  Mobility/Activity is Maintained at Optimal Level for Patient:   Increase mobility as tolerated/progressive mobility protocol   Maintain SCD's as Ordered   Perform active/passive ROM   Reposition patient every 2 hours and as needed unless able to reposition self   Assess for changes in respiratory status, level of consciousness and/or development of fatigue  Goal: Nutritional Intake is Adequate  Outcome: Progressing  Flowsheets (Taken 07/02/2023 0917 by Lawerance Cruel, RN)  Nutritional Intake is Adequate:   Cardiac diet-2 gm Sodium   Fluid Restricction if needed   Consult/Collaborate with Nutritionist   Patient and family teaching on low sodium diet   Assess appetite,anorexia and amount of meal/food tolerated   Encourage/perform oral hygiene as appropriate  Goal: Teaching-Using CHF Warning Zones and Educational Videos  Outcome: Progressing  Flowsheets (Taken 07/02/2023 0917 by Lawerance Cruel, RN)  Teaching-Using CHF Warning Zones and Educational Videos:   Daily Standing Weights & record   Signs & Symptoms of CHF   CHF Warning Zones and when to call for help   Sodium Restriction   Medications   Fluid Restriction if appropriate   Document in the Education Tab in EPIC with Teach-back

## 2023-07-03 NOTE — Progress Notes (Signed)
07/03/23 1820   VO2 Determination   Height 1.829 m (6')   Weight FICK (only) 90.4 kg (199 lb 4.7 oz)   BSA (Fick only) (Calculated - sq m) 2.14 sq meters   VO2 267.5   CO/CI Determination   SaO2  0.98   SvO2  0.4   SaO2 - SvO2 0.58   HGB Result 9.9   x 1.36 x 10 78.09   Cardiac Output (FICK) 3.43   Cardiac Index (FICK) 1.6   Cardiac Power Output 0.55   PVR Determination   PAP 48/19   PAP (Mean) 34 mmHg   Pulmonary Artery Pulsatility Index (PAPI) 1.45   SVR Determination   MAP (mmHg) 75   CVP (mmHg) 20 mmHg   MAP - CVP 55   SVR (dynes/second/cm3) 1282.8 dynes/second/cm3

## 2023-07-03 NOTE — Progress Notes (Addendum)
07/03/23 0000   VO2 Determination   Height 1.829 m (6' 0.01")   Weight FICK (only) 90.4 kg (199 lb 4.7 oz)   BSA (Fick only) (Calculated - sq m) 2.14 sq meters   VO2 267.5   CO/CI Determination   SaO2  0.98   SvO2  0.52   SaO2 - SvO2 0.46   HGB Result 9.9   x 1.36 x 10 61.93   Cardiac Output (FICK) 4.32   Cardiac Index (FICK) 2.02   Cardiac Power Output 0.72   PVR Determination   PAP 58/31   Pulmonary Artery Pulsatility Index (PAPI) 1.04   SVR Determination   MAP (mmHg) 75   CVP (mmHg) 26 mmHg   MAP - CVP 49   SVR (dynes/second/cm3) 907.41 dynes/second/cm3     Gtts currently infusing at: Dobutamine @ 5, Nipride @ 0.5, Bumex @ 1, Amio @ 0.5. Dr. Audley Hose notified about FICK results. Pt's SBP 110-120, MAP 80s, titrating Nipride to goal SBP goal >90 , MAP 65-75. Per Dr. Audley Hose decrease Nipride to 0.25. New Nipride parameter goal SBP <130. Will continue to do FICK Q6h.

## 2023-07-03 NOTE — Significant Event (Signed)
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 Garnette Scheuermann evaluation and recommendations.  Increase hydralazine  Add metolazone  Continue with Swan-Ganz catheter to allow titration of medications.    Critical care time: 31 minutes for chart review, placement of orders, coordination of therapy and discussion with specialist.

## 2023-07-03 NOTE — Progress Notes (Signed)
[name] C[facility] PROGRESS NOTE    [name]  [facility] [place]  [address]  [place] [address]  Dept: [phone]  Loc: [phone]    Date Time: 07/03/23 10:34 AM  Patient Name: [name] [name] Day: 3    Subjective/Cardiac Relevant Events:   Sitting in chair doing well.  PA diastolics somewhat oscillating though generally slightly higher today versus yesterday.  Renal function stable, symptomatically improved, maintained on stable dose of dobutamine but net positive fluid status.  Lactate improved    ASSESSMENT:   Patient is a 77 y.o. 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    Tentative plan for MitraClip  Severe functional tricuspid regurgitation  CAD with reported PCI in 2011  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  Stage IV CLL, diagnosed in September 2021, 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   HTN  HLD  Diabetes  History of RLE DVT       RECOMMENDATIONS:     Add metolazone 2.5 mg x 1   Hydralazine was increased today to 75 mg q8hr  if PAD increasing much more than 15-17, add metolazone if stable then likely euvolemic and therefore would run net even today as renal function is deteriorated potentially over diuresed at this juncture    Telemetry/Labs/Intake-Output:     Telemetry reviewed: V paced    Recent Labs     07/03/23  0549   Sodium 140   Potassium 3.9   Chloride 107   CO2 21   BUN 48*   Creatinine 3.2*   Calcium 8.7    Magnesium 2.5   WBC 6.82   Hemoglobin 9.8*   Hematocrit 30.5*   Platelet Count 70*         Intake/Output Summary (Last 24 hours) at 07/03/2023 1034  Last data filed at 07/03/2023 0854  Gross per 24 hour   Intake 2638.83 ml   Output 1125 ml   Net 1513.83 ml       Medications:      Scheduled Meds: PRN Meds:    atorvastatin, 80 mg, Oral, Daily  hydrALAZINE, 50 mg, Oral, Q8H SCH  insulin glargine, 15 Units, Subcutaneous, QHS  insulin glargine, 15 Units, Subcutaneous, QAM  insulin lispro, 1-6 Units, Subcutaneous, QHS  insulin lispro, 2-10 Units, Subcutaneous, TID AC  pantoprazole, 40 mg, Oral, QAM AC        Continuous Infusions:   amiodarone 0.5 mg/min (07/03/23 0700)    bumetanide 1 mg/hr (07/03/23 0700)    DOBUTamine 5 mcg/kg/min (07/03/23 0700)    nitroprusside Stopped (07/03/23 0051)    acetaminophen, 650 mg, Q4H PRN  alum & mag hydroxide-simethicone, 30 mL, Q4H PRN  benzonatate, 200 mg, TID PRN  dextrose, 15 g of glucose, PRN   Or  dextrose, 12.5 g, PRN   Or  dextrose, 12.5 g, PRN  guaiFENesin-dextromethorphan, 10 mL, Q4H PRN  melatonin, 3 mg, QHS PRN  naloxone, 0.2 mg, PRN  ondansetron, 4 mg,  Q6H PRN  senna-docusate, 2 tablet, QHS PRN  traZODone, 50 mg, QHS PRN          Physical Exam:     Vitals:    07/03/23 0700 07/03/23 0800 07/03/23 0900 07/03/23 0959   BP:  107/71     Pulse: 90 80 76    Resp: 16 18 16     Temp:    97 F (36.1 C)   TempSrc:    Temporal   SpO2: 97% 97% 97%    Weight:       Height:             05/14/2023 06/26/2023 06/29/2023 06/30/2023 07/01/2023 07/02/2023 07/03/2023   Weight Monitoring   Height 182.9 cm 182.9 cm 182.9 cm  182.9 cm    182.9 cm    182.9 cm    182.9 cm    182.9 cm 182.9 cm    182.9 cm    182.9 cm    182.9 cm 182.9 cm    182.9 cm   Height Method  Stated Stated       Weight 81.647 kg 93.8 kg    --  90 kg    87.4 kg 90.4 kg    77.9 kg  88.4 kg 91 kg   Weight Method  Bed Scale Standing Scale    Bed Scale Standing Scale  Bed Scale    BMI (calculated) 24.4 kg/m2 28 kg/m2 26.9 kg/m2    26.1  kg/m2 27 kg/m2    23.3 kg/m2  26.4 kg/m2 27.2 kg/m2       Significant value    Multiple values from one day are sorted in reverse-chronological order       Constitutional: Cooperative, alert, no acute distress.  Neck: No carotid bruits, JVP normal.  Cardiac: Regular rate and rhythm, normal S1 and S2; no S3 or S4, no rubs, no gallops, no murmurs  Pulmonary: Diminished breath sound  Extremities: 1+ peripheral edema  Vascular: +2 pulses in radial artery bilaterally, 2+ pedal pulses bilaterally.        -------------------------------------------------------------------------------------  Signed by:         Clarice Pole, MD         [facility]    [name] Contact Information   [address] [facility]  Secure Chat (Group):   [facility] [place] [facility]    APP Spectralink:  [phone]    MD Spectralink :  [phone]  [phone]    After hours, non urgent consult line:  [phone]    After hours, physician on-call:  [phone] [facility]  Secure Chat (Group):   [facility] [place] [facility]    APP Spectralink:  [phone]    MD Spectralink :  [phone]      After hours, non urgent consult line:  [phone]    After hours, physician on-call:  [phone] [facility]  Secure Chat (Group):   [facility]    APP Spectralink:  [phone]    MD Spectralink :  [phone]      After hours, non urgent consult line:  [phone]    After hours, physician on-call:  [phone] [name]  Secure Chat (Group):   [facility]    APP Spectralink:  [phone]    MD Spectralink :  [phone]      After hours, non urgent consult line:  [phone]    After hours, physician on-call:  [phone]       This note was generated by the Dragon speech recognition and may contain  errors or omissions not intended by the user. Grammatical errors, random word insertions, deletions, pronoun errors, and incomplete sentences are occasional consequences of this technology due to software limitations. Not all errors are caught or  corrected. If there are questions or concerns about the content of this note or information contained within the body of this dictation, they should be addressed directly with the author for clarification.

## 2023-07-04 ENCOUNTER — Inpatient Hospital Stay: Payer: Commercial Managed Care - POS

## 2023-07-04 LAB — BASIC METABOLIC PANEL
Anion Gap: 12 (ref 5.0–15.0)
Anion Gap: 12 (ref 5.0–15.0)
Anion Gap: 12 (ref 5.0–15.0)
Anion Gap: 12 (ref 5.0–15.0)
BUN: 54 mg/dL — ABNORMAL HIGH (ref 9–28)
BUN: 56 mg/dL — ABNORMAL HIGH (ref 9–28)
BUN: 62 mg/dL — ABNORMAL HIGH (ref 9–28)
BUN: 64 mg/dL — ABNORMAL HIGH (ref 9–28)
CO2: 20 meq/L (ref 17–29)
CO2: 20 meq/L (ref 17–29)
CO2: 21 meq/L (ref 17–29)
CO2: 21 meq/L (ref 17–29)
Calcium: 8.7 mg/dL (ref 7.9–10.2)
Calcium: 8.7 mg/dL (ref 7.9–10.2)
Calcium: 9 mg/dL (ref 7.9–10.2)
Calcium: 9 mg/dL (ref 7.9–10.2)
Chloride: 101 meq/L (ref 99–111)
Chloride: 102 meq/L (ref 99–111)
Chloride: 102 meq/L (ref 99–111)
Chloride: 105 meq/L (ref 99–111)
Creatinine: 3.5 mg/dL — ABNORMAL HIGH (ref 0.5–1.5)
Creatinine: 3.5 mg/dL — ABNORMAL HIGH (ref 0.5–1.5)
Creatinine: 3.7 mg/dL — ABNORMAL HIGH (ref 0.5–1.5)
Creatinine: 3.7 mg/dL — ABNORMAL HIGH (ref 0.5–1.5)
GFR: 16.2 mL/min/{1.73_m2} — ABNORMAL LOW (ref >=60.0)
GFR: 16.2 mL/min/{1.73_m2} — ABNORMAL LOW (ref >=60.0)
GFR: 17.3 mL/min/{1.73_m2} — ABNORMAL LOW (ref >=60.0)
GFR: 17.3 mL/min/{1.73_m2} — ABNORMAL LOW (ref >=60.0)
Glucose: 216 mg/dL — ABNORMAL HIGH (ref 70–100)
Glucose: 263 mg/dL — ABNORMAL HIGH (ref 70–100)
Glucose: 293 mg/dL — ABNORMAL HIGH (ref 70–100)
Glucose: 294 mg/dL — ABNORMAL HIGH (ref 70–100)
Potassium: 4 meq/L (ref 3.5–5.3)
Potassium: 4 meq/L (ref 3.5–5.3)
Potassium: 4.2 meq/L (ref 3.5–5.3)
Potassium: 4.5 meq/L (ref 3.5–5.3)
Sodium: 134 meq/L — ABNORMAL LOW (ref 135–145)
Sodium: 134 meq/L — ABNORMAL LOW (ref 135–145)
Sodium: 135 meq/L (ref 135–145)
Sodium: 137 meq/L (ref 135–145)

## 2023-07-04 LAB — COOXIMETRY PROFILE
Carboxyhemoglobin: 1.1 % (ref 0.4–2.7)
Carboxyhemoglobin: 1.1 % (ref 0.4–2.7)
Carboxyhemoglobin: 1.2 % (ref 0.4–2.7)
Carboxyhemoglobin: 1.2 % (ref 0.4–2.7)
Carboxyhemoglobin: 1.3 % (ref 0.4–2.7)
Hematocrit Total Calculated: 31 % — ABNORMAL LOW (ref 40.0–54.0)
Hematocrit Total Calculated: 31.2 % — ABNORMAL LOW (ref 40.0–54.0)
Hematocrit Total Calculated: 31.5 % — ABNORMAL LOW (ref 40.0–54.0)
Hematocrit Total Calculated: 31.5 % — ABNORMAL LOW (ref 40.0–54.0)
Hematocrit Total Calculated: 31.7 % — ABNORMAL LOW (ref 40.0–54.0)
Hemoglobin Total: 10.1 g/dL — ABNORMAL LOW (ref 13.0–17.0)
Hemoglobin Total: 10.2 g/dL — ABNORMAL LOW (ref 13.0–17.0)
Hemoglobin Total: 10.3 g/dL — ABNORMAL LOW (ref 13.0–17.0)
Hemoglobin Total: 10.3 g/dL — ABNORMAL LOW (ref 13.0–17.0)
Hemoglobin Total: 10.3 g/dL — ABNORMAL LOW (ref 13.0–17.0)
Methemoglobin: 0.5 % (ref 0.0–1.5)
Methemoglobin: 0.6 % (ref 0.0–1.5)
Methemoglobin: 0.6 % (ref 0.0–1.5)
Methemoglobin: 0.7 % (ref 0.0–1.5)
Methemoglobin: 0.7 % (ref 0.0–1.5)
Oxygenated Hemoglobin: 42.9 % — ABNORMAL LOW (ref 94.0–98.0)
Oxygenated Hemoglobin: 44.8 % — ABNORMAL LOW (ref 94.0–98.0)
Oxygenated Hemoglobin: 45.5 % — ABNORMAL LOW (ref 94.0–98.0)
Oxygenated Hemoglobin: 49.5 % — ABNORMAL LOW (ref 94.0–98.0)
Oxygenated Hemoglobin: 51.6 % — ABNORMAL LOW (ref 94.0–98.0)

## 2023-07-04 LAB — CBC
Absolute nRBC: 0.06 10*3/uL — ABNORMAL HIGH (ref <=0.00)
Hematocrit: 31 % — ABNORMAL LOW (ref 37.6–49.6)
Hemoglobin: 10.1 g/dL — ABNORMAL LOW (ref 12.5–17.1)
MCH: 28.3 pg (ref 25.1–33.5)
MCHC: 32.6 g/dL (ref 31.5–35.8)
MCV: 86.8 fL (ref 78.0–96.0)
Platelet Count: 68 10*3/uL — ABNORMAL LOW (ref 142–346)
RBC: 3.57 10*6/uL — ABNORMAL LOW (ref 4.20–5.90)
RDW: 19 % — ABNORMAL HIGH (ref 11–15)
WBC: 7.19 10*3/uL (ref 3.10–9.50)
nRBC %: 0.8 /100{WBCs} — ABNORMAL HIGH (ref <=0.0)

## 2023-07-04 LAB — HEMOGLOBIN AND HEMATOCRIT
Hematocrit: 30.1 % — ABNORMAL LOW (ref 37.6–49.6)
Hematocrit: 30.1 % — ABNORMAL LOW (ref 37.6–49.6)
Hematocrit: 30.3 % — ABNORMAL LOW (ref 37.6–49.6)
Hematocrit: 30.5 % — ABNORMAL LOW (ref 37.6–49.6)
Hematocrit: 31.3 % — ABNORMAL LOW (ref 37.6–49.6)
Hemoglobin: 10 g/dL — ABNORMAL LOW (ref 12.5–17.1)
Hemoglobin: 9.8 g/dL — ABNORMAL LOW (ref 12.5–17.1)
Hemoglobin: 9.8 g/dL — ABNORMAL LOW (ref 12.5–17.1)
Hemoglobin: 9.9 g/dL — ABNORMAL LOW (ref 12.5–17.1)
Hemoglobin: 9.9 g/dL — ABNORMAL LOW (ref 12.5–17.1)

## 2023-07-04 LAB — LACTIC ACID
Whole Blood Lactic Acid: 1 mmol/L (ref 0.2–2.0)
Whole Blood Lactic Acid: 1.2 mmol/L (ref 0.2–2.0)
Whole Blood Lactic Acid: 1.2 mmol/L (ref 0.2–2.0)
Whole Blood Lactic Acid: 1.2 mmol/L (ref 0.2–2.0)
Whole Blood Lactic Acid: 1.4 mmol/L (ref 0.2–2.0)

## 2023-07-04 LAB — PHOSPHORUS: Phosphorus: 4.4 mg/dL (ref 2.3–4.7)

## 2023-07-04 LAB — WHOLE BLOOD GLUCOSE POCT
Whole Blood Glucose POCT: 245 mg/dL — ABNORMAL HIGH (ref 70–100)
Whole Blood Glucose POCT: 260 mg/dL — ABNORMAL HIGH (ref 70–100)
Whole Blood Glucose POCT: 217 mg/dL — ABNORMAL HIGH (ref 70–100)

## 2023-07-04 LAB — MAGNESIUM
Magnesium: 2.5 mg/dL (ref 1.6–2.6)
Magnesium: 2.5 mg/dL (ref 1.6–2.6)

## 2023-07-04 NOTE — Progress Notes (Signed)
07/04/23 1200   VO2 Determination   Height 1.829 m (6')   Weight FICK (only) 90.4 kg (199 lb 4.7 oz)   BSA (Fick only) (Calculated - sq m) 2.14 sq meters   VO2 267.5   CO/CI Determination   SaO2  0.98   SvO2  0.46   SaO2 - SvO2 0.52   HGB Result 10.1   x 1.36 x 10 71.43   Cardiac Output (FICK) 3.74   Cardiac Index (FICK) 1.75   Cardiac Power Output 0.6   PVR Determination   PAP 45/18   PAP (Mean) 30 mmHg   Pulmonary Artery Pulsatility Index (PAPI) 1.35   SVR Determination   MAP (mmHg) 81   CVP (mmHg) 20 mmHg   MAP - CVP 61   SVR (dynes/second/cm3) 1304.81 dynes/second/cm3

## 2023-07-04 NOTE — Progress Notes (Signed)
07/04/23 1800   VO2 Determination   Height 1.829 m (6')   Weight FICK (only) 90.4 kg (199 lb 4.7 oz)   BSA (Fick only) (Calculated - sq m) 2.14 sq meters   VO2 267.5   CO/CI Determination   SaO2  0.98   SvO2  0.45   SaO2 - SvO2 0.53   HGB Result 10.1   x 1.36 x 10 72.8   Cardiac Output (FICK) 3.67   Cardiac Index (FICK) 1.71   Cardiac Power Output 0.63   PVR Determination   PAP 48/22   PAP (Mean) 33 mmHg   Pulmonary Artery Pulsatility Index (PAPI) 1.13   SVR Determination   MAP (mmHg) 78   CVP (mmHg) 23 mmHg   MAP - CVP 55   SVR (dynes/second/cm3) 1198.91 dynes/second/cm3

## 2023-07-04 NOTE — Progress Note - Problem Oriented Charting Notewrit (Signed)
PROGRESS NOTE      Date Time: 07/04/23 10:49 AM  Patient Name: [name] [name], [name] [name]    Chief Complaint:   ARF. CKD 3b.    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  Appeared to be in decompensated CHF and started on dobutamine drips, nitroprusside drips.  Responded well.  Will follow the patient in the office therefore we have been asked to follow the patient in the hospital.    Less short of breath.  Denies chest pain.  Denies abdominal pain.  No fluid retention lower extremities.  Cardiac monitor with Theone Murdoch     Medications:   Scheduled Meds:  Current Facility-Administered Medications   Medication Dose Route Frequency    atorvastatin  80 mg Oral Daily    hydrALAZINE  75 mg Oral Q8H SCH    insulin glargine  15 Units Subcutaneous QHS    insulin glargine  15 Units Subcutaneous QAM    insulin lispro  1-6 Units Subcutaneous QHS    insulin lispro  2-10 Units Subcutaneous TID AC    pantoprazole  40 mg Oral QAM AC     Continuous Infusions:   amiodarone 0.5 mg/min (07/04/23 0107)    bumetanide 1 mg/hr (07/04/23 0253)    DOBUTamine 5 mcg/kg/min (07/03/23 1831)    nitroprusside Stopped (07/03/23 0051)     PRN Meds:.acetaminophen, alum & mag hydroxide-simethicone, benzonatate, guaiFENesin-dextromethorphan, melatonin, naloxone, ondansetron **OR** [DISCONTINUED] ondansetron, senna-docusate, traZODone    Review of Systems:   Review of systems was: Negative except -shortness of breath.    Physical Exam:   Blood pressure 118/81, pulse 80, temperature 97 F (36.1 C), temperature source Temporal, resp. rate 17, height 1.829 m (6'), weight 85.7 kg (188 lb 15 oz), SpO2 99%.     Intake and Output Summary (Last 24 hours) at Date Time:   Intake/Output Summary (Last 24 hours) at 07/04/2023 1049  Last data filed at 07/04/2023 1000  Gross per 24 hour    Intake 1838.91 ml   Output 2300 ml   Net -461.09 ml     Intake and Output Summary for yesterday: 09/14 0701 - 09/15 0700  In: 1953.73 [P.O.:1140; I.V.:813.73]  Out: 1800 [Urine:1800]     Mental status - alert, oriented to person, place, and time  Eyes - pupils equal and reactive, extraocular eye movements intact  Ears -  external ear canals normal  Nose - normal and patent, no erythema, discharge or polyps  Mouth - mucous membranes moist, pharynx normal without lesions  Neck - supple, no significant adenopathy, no JVD  Lymphatics - no palpable lymphadenopathy, no hepatosplenomegaly  Chest - diminished to auscultation, no wheezes, rales or rhonchi, symmetric air entry  Heart - normal rate, regular rhythm, normal S1, S2, no murmurs, rubs, clicks or gallops  Abdomen - soft, nontender, nondistended, no masses or organomegaly, bowel sounds present. NO bruits over the renal arteries.  Neurological - awake, alert, oriented x3, normal speech, no focal findings or movement disorder noted  Musculoskeletal - no joint tenderness, deformity or swelling  Extremities - peripheral pulses normal, no pedal edema, no clubbing or cyanosis.  Skin - normal coloration and turgor, no rashes, no suspicious skin lesions noted    Labs:     Results       Procedure Component Value Units Date/Time    Whole Blood Glucose POCT [  [id number]]  (Abnormal) Collected: 07/04/23 0808    Specimen: Blood, Capillary Updated: 07/04/23 0813     Whole Blood Glucose POCT 245 mg/dL     Phosphorus [[id number]]  (Normal) Collected: 07/04/23 0558    Specimen: Blood, Venous Updated: 07/04/23 0810     Phosphorus 4.4 mg/dL     Basic Metabolic Panel [[id number]]  (Abnormal) Collected: 07/04/23 0558    Specimen: Blood, Venous Updated: 07/04/23 4696     Glucose 216 mg/dL      BUN 56 mg/dL      Creatinine 3.5 mg/dL      Calcium 9.0 mg/dL      Sodium 295 mEq/L      Potassium 4.0 mEq/L      Chloride 105 mEq/L      CO2 20 mEq/L      Anion Gap 12.0     GFR 17.3 mL/min/1.73 m2      Hemoglobin and Hematocrit [[id number]]  (Abnormal) Collected: 07/04/23 0558    Specimen: Blood, Arterial Updated: 07/04/23 0618     Hemoglobin 9.9 g/dL      Hematocrit 10.2 %     Lactic Acid [[id number]]  (Normal) Collected: 07/04/23 0558    Specimen: Blood, Arterial Updated: 07/04/23 0614     Whole Blood Lactic Acid 1.0 mmol/L     Co-Oximetry Profile [[id number]]  (Abnormal) Collected: 07/04/23 0553    Specimen: Venous Blood from Blood, Venous Updated: 07/04/23 0553     Hematocrit Total Calculated 31.0 %      Hemoglobin Total 10.1 g/dL      Carboxyhemoglobin 1.2 %      Oxygenated Hemoglobin 49.5 %      Methemoglobin 0.6 %      Collection Site Not Specif    CBC without Differential [[id number]]  (Abnormal) Collected: 07/04/23 0303    Specimen: Blood, Arterial Updated: 07/04/23 0335     WBC 7.19 x10 3/uL      Hemoglobin 10.1 g/dL      Hematocrit 95.1 %      Platelet Count 68 x10 3/uL      MPV --     RBC 3.57 x10 6/uL      MCV 86.8 fL      MCH 28.3 pg      MCHC 32.6 g/dL      RDW 19 %      nRBC % 0.8 /100 WBC      Absolute nRBC 0.06 x10 3/uL     Basic Metabolic Panel [[id number]]  (Abnormal) Collected: 07/04/23 0007    Specimen: Blood, Venous Updated: 07/04/23 0033     Glucose 263 mg/dL      BUN 54 mg/dL      Creatinine 3.5 mg/dL      Calcium 9.0 mg/dL      Sodium 016 mEq/L      Potassium 4.0 mEq/L      Chloride 102 mEq/L      CO2 20 mEq/L      Anion Gap 12.0     GFR 17.3 mL/min/1.73 m2     Hemoglobin and Hematocrit [[id number]]  (Abnormal) Collected: 07/04/23 0007    Specimen: Blood, Arterial Updated: 07/04/23 0023     Hemoglobin 10.0 g/dL      Hematocrit 73.2 %     Lactic Acid [[id number]]  (Normal) Collected: 07/04/23 0007    Specimen: Blood, Arterial Updated: 07/04/23 0019     Whole Blood Lactic Acid 1.2 mmol/L     Co-Oximetry Profile [[id number]]  (Abnormal) Collected: 07/03/23  2359    Specimen: Venous Blood from Blood, Venous Updated: 07/04/23 0000     Hematocrit Total Calculated 31.5 %      Hemoglobin Total 10.3  g/dL      Carboxyhemoglobin 1.3 %      Oxygenated Hemoglobin 51.6 %      Methemoglobin 0.5 %      Collection Site Not Specif    Whole Blood Glucose POCT [[id number]]  (Abnormal) Collected: 07/03/23 2149    Specimen: Blood, Capillary Updated: 07/03/23 2151     Whole Blood Glucose POCT 295 mg/dL     Basic Metabolic Panel [[id number]]  (Abnormal) Collected: 07/03/23 1826    Specimen: Blood, Venous Updated: 07/03/23 1930     Glucose 273 mg/dL      BUN 51 mg/dL      Creatinine 3.4 mg/dL      Calcium 8.4 mg/dL      Sodium 440 mEq/L      Potassium 4.2 mEq/L      Chloride 105 mEq/L      CO2 19 mEq/L      Anion Gap 11.0     GFR 18.0 mL/min/1.73 m2     Magnesium [[id number]]  (Normal) Collected: 07/03/23 1826    Specimen: Blood, Venous Updated: 07/03/23 1930     Magnesium 2.4 mg/dL     Lactic Acid [[id number]]  (Normal) Collected: 07/03/23 1826    Specimen: Blood, Venous Updated: 07/03/23 1844     Whole Blood Lactic Acid 1.2 mmol/L     Hemoglobin and Hematocrit [[id number]]  (Abnormal) Collected: 07/03/23 1826    Specimen: Blood, Venous Updated: 07/03/23 1839     Hemoglobin 10.0 g/dL      Hematocrit 33.2 %     Co-Oximetry Profile [[id number]]  (Abnormal) Collected: 07/03/23 1824    Specimen: Blood, Venous Updated: 07/03/23 1824     Hematocrit Total Calculated 31.6 %      Hemoglobin Total 10.3 g/dL      Carboxyhemoglobin 1.1 %      Oxygenated Hemoglobin 40.3 %      Methemoglobin 0.6 %      Collection Site Not Specif    Whole Blood Glucose POCT [[id number]]  (Abnormal) Collected: 07/03/23 1551    Specimen: Blood, Capillary Updated: 07/03/23 1556     Whole Blood Glucose POCT 250 mg/dL     Hepatic Function Panel (LFT) [[id number]]  (Abnormal) Collected: 07/03/23 1232    Specimen: Blood, Venous Updated: 07/03/23 1322     Bilirubin, Total 1.3 mg/dL      Bilirubin Direct 0.8 mg/dL      Bilirubin Indirect 0.5 mg/dL      AST (SGOT) 22 U/L      ALT 35 U/L      Alkaline Phosphatase 133 U/L      Albumin 3.3 g/dL      Globulin 3.1 g/dL       Albumin/Globulin Ratio 1.1     Protein, Total 6.4 g/dL     Basic Metabolic Panel [[id number]]  (Abnormal) Collected: 07/03/23 1232    Specimen: Blood, Venous Updated: 07/03/23 1303     Glucose 268 mg/dL      BUN 48 mg/dL      Creatinine 3.2 mg/dL      Calcium 8.4 mg/dL      Sodium 237 mEq/L      Potassium 4.3 mEq/L      Chloride 105 mEq/L      CO2 20 mEq/L      Anion Gap 11.0  GFR 19.3 mL/min/1.73 m2     Magnesium [[id number]]  (Normal) Collected: 07/03/23 1232    Specimen: Blood, Venous Updated: 07/03/23 1303     Magnesium 2.4 mg/dL     Hemoglobin and Hematocrit [[id number]]  (Abnormal) Collected: 07/03/23 1232    Specimen: Blood, Venous Updated: 07/03/23 1240     Hemoglobin 9.7 g/dL      Hematocrit 78.2 %     Lactic Acid [[id number]]  (Normal) Collected: 07/03/23 1232    Specimen: Blood, Venous Updated: 07/03/23 1239     Whole Blood Lactic Acid 1.3 mmol/L     Co-Oximetry Profile [[id number]]  (Abnormal) Collected: 07/03/23 1226    Specimen: Blood, Venous Updated: 07/03/23 1226     Hematocrit Total Calculated 31.5 %      Hemoglobin Total 10.3 g/dL      Carboxyhemoglobin 1.2 %      Oxygenated Hemoglobin 43.4 %      Methemoglobin 0.7 %      Collection Site Mixed    Whole Blood Glucose POCT [[id number]]  (Abnormal) Collected: 07/03/23 1149    Specimen: Blood, Capillary Updated: 07/03/23 1153     Whole Blood Glucose POCT 223 mg/dL             Labs (last 72 hours):  Recent Labs     07/04/23  0558 07/04/23  0303 07/04/23  0007 07/03/23  1232 07/03/23  0549 07/02/23  2354 07/02/23  0520   WBC  --  7.19  --   --  6.82  --  6.70   Hemoglobin 9.9* 10.1* 10.0*   < > 9.8*   < > 9.9*   Hematocrit 30.1* 31.0* 31.3*   < > 30.5*   < > 31.3*    < > = values in this interval not displayed.     No results for input(s): "PT", "INR", "PTT" in the last 72 hours.   Recent Labs     07/04/23  0558 07/04/23  0007 07/03/23  1826 07/03/23  1232 07/03/23  0549   Sodium 137 134* 135 136 140   Potassium 4.0 4.0 4.2 4.3 3.9   Chloride 105 102 105  105 107   CO2 20 20 19 20 21    BUN 56* 54* 51* 48* 48*   Creatinine 3.5* 3.5* 3.4* 3.2* 3.2*   Glucose 216* 263* 273* 268* 173*   Calcium 9.0 9.0 8.4 8.4 8.7   Magnesium  --   --  2.4 2.4 2.5   Phosphorus 4.4  --   --   --   --                    Rads:     Radiology Results (24 Hour)       Procedure Component Value Units Date/Time    XR Chest AP Portable [[id number]] Resulted: 07/04/23 0750    Order Status: Sent Updated: 07/04/23 0828    XR Chest AP Portable [[id number]] Collected: 07/03/23 2002    Order Status: Completed Updated: 07/03/23 2005    Narrative:      HISTORY: Reevaluate pulmonary artery catheter     COMPARISON: 07/03/2023    FINDINGS:   Moderately enlarged cardiac silhouette is stable.  Right IJ approach pulmonary artery catheter with its tip overlying mean  pulmonary outflow, unchanged  ICD device is a stable.  No pleural effusion or pneumothorax.  Mild interstitial edema.  No acute osseous deformities.      Impression:  Stable lines and tubes.  Stable mild interstitial edema.    Pankaj Dominica, MD  07/03/2023 8:03 PM    XR Chest AP Portable [[id number]] Collected: 07/03/23 1537    Order Status: Completed Updated: 07/03/23 1545    Narrative:      HISTORY: Abnormal chest radiograph, follow-up     COMPARISON: 07/02/2023    FINDINGS:   LINES AND TUBES: Stable position of right jugular Swan-Ganz catheter tip.  Stable position of left pacemaker with stable leads.    CARDIAC SILHOUETTE: Stable in size.    LUNGS/PLEURA: Stable mild pulmonary vascular congestion. No consolidation,  pleural effusion or pneumothorax.      Impression:        1.Stable position of right jugular Swan-Ganz catheter tip.  2.Stable mild pulmonary vascular congestion.    Legrand Pitts, MD  07/03/2023 3:43 PM              Assessment:   ARF - due to cardiorenal syndrome.  In spite of significant diuresis Weg goes up.  The kidney function remains stable having current diuresis .  Continue Bumex drips at 1 mg/h as long as kidney function  continues to improve.  When creatinine stops improving will change Bumex to shots around-the-clock.  CKD 3B - the baseline creatinine level is 1.7 in April of this year.  Not sure if the kidney function can return back to the baseline. Cr remained 3.4   CHF - the cardiac function is better (weg 17>15).  The medical management is optimized.    Hyponatremia - due to volume overload. Resolved. Continue current diuresis.  Diabetes type 2 - on sliding scale insulin.    Plan:   Bumex drip at 1 mg/h  Amiodarone drips  Dobutamine drips  Nitroprusside drips  Pressors drips   CBC, BMP daily.  Cr 3.3 stable     Time spent for evaluation, management and coordination of care:  40 minutes      Signed by: Irma Newness, MD, MD  [phone], office  [phone], pager

## 2023-07-04 NOTE — Progress Notes (Addendum)
CRITICAL CARE  [facility]     [facility]- Critical Care Note     ICU Daily Progress Note        Date Time: 07/04/23 9:53 PM  Patient Name: [name] [name]  Attending Physician: Durward Fortes, MD  Room: IC10/IC10-A   Admit Date: 06/29/2023  LOS: 4 days      Assessment:   Cardiogenic shock on treatment with inotropic agents vasodilators and diuretics.  Dobutamine has been decreased to 2.5 mcg/kg/min.  Nitroprusside off in 24 hours.  Diuretics decreased to 0.5 mg/h Bumex due to increasing BUN and creatinine.  Net I's and O's output -3.4 L length of stay  Cardiac output, SVR stable.  Lactic acid normal 1.4  Diabetes with fair control with insulin.  Hyponatremia related to CHF, renal dysfunction.  Plan:   Weaning inotropic agents and diuretics.  Monitor electrolytes  Reassess need for [facility] tomorrow and present medical therapy.  Continue diuresis.  Follow-up chest x-ray.    OTHER:  Glycemic Control. Glucose stabilizer per ICU protocol when on insulin drip. Maintain blood glucose 140-180.   Replace electrolytes per ICU electrolyte replacement protocol  Aggressive pulmonary toileting. Incentive spirometry when appropriate. Aspiration precautions.      Quality Care: Stress ulcer prophylaxis, DVT prophylaxis, HOB elevated, Infection control all reviewed and addressed.  Events and notes from last 24 hours reviewed. Care plan discussed with nursing.     CC TIME: >50 min     Subjective:   No acute distress no chest pain  Medications:       Scheduled Meds: PRN Meds:    atorvastatin, 80 mg, Oral, Daily  hydrALAZINE, 75 mg, Oral, Q8H SCH  insulin glargine, 15 Units, Subcutaneous, QHS  insulin glargine, 15 Units, Subcutaneous, QAM  insulin lispro, 1-6 Units, Subcutaneous, QHS  insulin lispro, 2-10 Units, Subcutaneous, TID AC  pantoprazole, 40 mg, Oral, QAM AC        Continuous Infusions:   amiodarone 0.5 mg/min (07/04/23 1323)    bumetanide 0.25 mg/hr (07/04/23 1930)    DOBUTamine 2.5  mcg/kg/min (07/04/23 1335)    acetaminophen, 650 mg, Q4H PRN  alum & mag hydroxide-simethicone, 30 mL, Q4H PRN  benzonatate, 200 mg, TID PRN  guaiFENesin-dextromethorphan, 10 mL, Q4H PRN  melatonin, 3 mg, QHS PRN  naloxone, 0.2 mg, PRN  ondansetron, 4 mg, Q6H PRN  senna-docusate, 2 tablet, QHS PRN  traZODone, 50 mg, QHS PRN          Physical Exam:     Vitals:    07/04/23 1900 07/04/23 1915 07/04/23 1930 07/04/23 2000   BP:    112/77   Pulse:    72   Resp:    21   Temp:    98.2 F (36.8 C)   TempSrc:    Temporal   SpO2: 100% 98% 98% 100%   Weight:       Height:         Temp (24hrs), Avg:98 F (36.7 C), Min:97.7 F (36.5 C), Max:98.2 F (36.8 C)       09/14 0701 - 09/15 0700  In: 1953.73 [P.O.:1140; I.V.:813.73]  Out: 1800 [Urine:1800]     General Appearance: Adult male no acute distress  Mental status: Alert  Neuro: Nonfocal  H & N: No JVD  Lungs: Clear breath sounds  Cardiac: Regular normal sounds  Abdomen: Soft benign  Extremities: Trace edema  Skin: Warm no rash    Data:      Labs:  Recent CBC   Recent Labs   Lab 07/04/23  1809 07/04/23  1215 07/04/23  0558 07/04/23  0303 07/03/23  1232 07/03/23  0549 07/02/23  2354 07/02/23  0520   WBC  --   --   --  7.19  --  6.82  --  6.70   RBC  --   --   --  3.57*  --  3.50*  --  3.60*   Hemoglobin 9.9* 9.8* 9.9* 10.1*  More results in Results Review 9.8*  More results in Results Review 9.9*   Hematocrit 30.5* 30.3* 30.1* 31.0*  More results in Results Review 30.5*  More results in Results Review 31.3*   MCV  --   --   --  86.8  --  87.1  --  86.9   Platelet Count  --   --   --  68*  --  70*  --  79*   More results in Results Review = values in this interval not displayed.       Recent Labs   Lab 07/04/23  1809 07/04/23  1215 07/04/23  0558 07/04/23  0007 07/03/23  1826 07/03/23  1232 07/01/23  1151 07/01/23  0532 06/30/23  0535 06/29/23  1914 06/29/23  1703   Sodium 134* 135 137  More results in Results Review 135 136  More results in Results Review 133*  More results  in Results Review  --  137   Potassium 4.2 4.5 4.0  More results in Results Review 4.2 4.3  More results in Results Review 3.8  More results in Results Review  --  5.3   Chloride 101 102 105  More results in Results Review 105 105  More results in Results Review 103  More results in Results Review  --  107   CO2 21 21 20   More results in Results Review 19 20  More results in Results Review 18  More results in Results Review  --  17   Glucose 293* 294* 216*  More results in Results Review 273* 268*  More results in Results Review 406*  More results in Results Review  --  248*   BUN 64* 62* 56*  More results in Results Review 51* 48*  More results in Results Review 54*  More results in Results Review  --  59*   Creatinine 3.7* 3.7* 3.5*  More results in Results Review 3.4* 3.2*  More results in Results Review 3.5*  More results in Results Review  --  3.8*   Magnesium 2.5 2.5  --   --  2.4 2.4  More results in Results Review 2.4  More results in Results Review  --   --    Phosphorus  --   --  4.4  --   --   --   --  4.0  --   --   --    AST (SGOT)  --   --   --   --   --  22  --   --   --   --  62*   ALT  --   --   --   --   --  35  --   --   --   --  53   Alkaline Phosphatase  --   --   --   --   --  133*  --   --   --   --  159*  Bilirubin, Total  --   --   --   --   --  1.3*  --   --   --   --  1.7*   Bilirubin Direct  --   --   --   --   --  0.8*  --   --   --   --   --    PTT  --   --   --   --   --   --   --   --   --  27  --    D-Dimer  --   --   --   --   --   --   --   --   --  0.94*  --    hs Troponin  --   --   --   --   --   --   --   --   --  67.9* 58.2*   More results in Results Review = values in this interval not displayed.       Rads:     Radiology Results (24 Hour)       Procedure Component Value Units Date/Time    XR Chest AP Portable [[id number]] Collected: 07/04/23 1452    Order Status: Completed Updated: 07/04/23 1457    Narrative:      HISTORY: Swan-Ganz catheter placement, abnormal chest  radiograph     COMPARISON: 07/03/2023    FINDINGS:   LINES AND TUBES: Stable position of right jugular Swan-Ganz catheter tip.  Stable position of left pacemaker with stable leads.    CARDIAC SILHOUETTE: Stable in size.    LUNGS/PLEURA: Improving pulmonary vascular congestion. No consolidation,  pleural effusion or pneumothorax.      Impression:        1.Improving pulmonary vascular congestion, suggestive of improving edema.    Legrand Pitts, MD  07/04/2023 2:54 PM            Radiological Imaging personally reviewed.    I have personally reviewed the patient's history and 24 hour interval events, along with vitals, labs, radiology images and  ventilator settings and additional findings found in detail within ICU team notes, with their care plans developed with and reviewed by me.     Time spent in patient evaluation and treatment in critical care excluding procedures, and not overlapping any other providers:     minutes.    Signed by: Durward Fortes, MD, MD  Date/Time: 07/04/23 9:53 PM

## 2023-07-04 NOTE — Plan of Care (Signed)
Problem: Pain interferes with ability to perform ADL  Goal: Pain at adequate level as identified by patient  Outcome: Progressing  Flowsheets (Taken 07/04/2023 2032)  Pain at adequate level as identified by patient:   Identify patient comfort function goal   Assess pain on admission, during daily assessment and/or before any "as needed" intervention(s)   Reassess pain within 30-60 minutes of any procedure/intervention, per Pain Assessment, Intervention, Reassessment (AIR) Cycle   Evaluate if patient comfort function goal is met   Evaluate patient's satisfaction with pain management progress   Offer non-pharmacological pain management interventions     Problem: Compromised Moisture  Goal: Moisture level Interventions  Outcome: Progressing  Flowsheets (Taken 07/04/2023 2000)  Moisture level Interventions: Moisture wicking products, Moisture barrier cream     Problem: Compromised Activity/Mobility  Goal: Activity/Mobility Interventions  Outcome: Progressing  Flowsheets (Taken 07/04/2023 2000)  Activity/Mobility Interventions: Pad bony prominences, TAP Seated positioning system when OOB, Promote PMP, Reposition q 2 hrs / turn clock, Offload heels     Problem: Moderate/High Fall Risk Score >5  Goal: Patient will remain free of falls  Outcome: Progressing  Flowsheets (Taken 07/04/2023 2000)  High (Greater than 13):   HIGH-Consider use of low bed   HIGH-Bed alarm on at all times while patient in bed   HIGH-Utilize chair pad alarm for patient while in the chair   HIGH-Apply yellow "Fall Risk" arm band     Problem: Everyday - Heart Failure  Goal: Stable Vital Signs and Fluid Balance  Outcome: Progressing  Flowsheets (Taken 07/04/2023 2032)  Stable Vital Signs and Fluid Balance:   Daily Standing Weights in the morning using the same scale, after using the bathroom and before breadfast.  If unable to stand, zero the bed and use the bed scale   Monitor, assess vital signs and telemetry per policy   Monitor labs and report  abnormalities to physician   Strict Intake/Output   Fluid Restriction   Assess for swelling/edema   Wean oxygen as needed if appropriate     Problem: Compromised Sensory Perception  Goal: Sensory Perception Interventions  Outcome: Progressing  Flowsheets (Taken 07/04/2023 2000)  Sensory Perception Interventions: Offload heels, Pad bony prominences, Reposition q 2hrs/turn Clock, Q2 hour skin assessment under devices if present     Problem: Compromised Nutrition  Goal: Nutrition Interventions  Outcome: Progressing  Flowsheets (Taken 07/04/2023 2000)  Nutrition Interventions: Discuss nutrition at Rounds, I&Os, Document % meal eaten, Daily weights     Problem: Compromised Friction/Shear  Goal: Friction and Shear Interventions  Outcome: Progressing  Flowsheets (Taken 07/04/2023 2000)  Friction and Shear Interventions: Pad bony prominences, Off load heels, HOB 30 degrees or less unless contraindicated, Consider: TAP seated positioning, Heel foams     Problem: Hemodynamic Status: Cardiac  Goal: Stable vital signs and fluid balance  Outcome: Progressing  Flowsheets (Taken 07/02/2023 0917 by Lawerance Cruel, RN)  Stable vital signs and fluid balance: Assess signs and symptoms associated with cardiac rhythm changes     Problem: Inadequate Tissue Perfusion  Goal: Adequate tissue perfusion will be maintained  Outcome: Progressing  Flowsheets (Taken 07/02/2023 0917 by Lawerance Cruel, RN)  Adequate tissue perfusion will be maintained:   Monitor/assess neurovascular status (pulses, capillary refill, pain, paresthesia, paralysis, presence of edema)   Monitor/assess lab values and report abnormal values   Monitor/assess for signs of VTE (edema of calf/thigh redness, pain)   Monitor for signs and symptoms of a pulmonary embolism (dyspnea, tachypnea, tachycardia, confusion)  Encourage/assist patient as needed to turn, cough, and perform deep breathing every 2 hours     Problem: Ineffective Gas Exchange  Goal: Effective breathing  pattern  Outcome: Progressing  Flowsheets (Taken 07/04/2023 2032)  Effective breathing pattern: Teach/reinforce use of ordered respiratory interventions (ie. CPAP, BiPAP, Incentive Spirometer, Acapella)

## 2023-07-04 NOTE — Plan of Care (Signed)
Problem: Pain interferes with ability to perform ADL  Goal: Pain at adequate level as identified by patient  Outcome: Progressing     Problem: Compromised Moisture  Goal: Moisture level Interventions  Outcome: Progressing     Problem: Compromised Activity/Mobility  Goal: Activity/Mobility Interventions  Outcome: Progressing     Problem: Compromised Sensory Perception  Goal: Sensory Perception Interventions  Outcome: Progressing     Problem: Compromised Nutrition  Goal: Nutrition Interventions  Outcome: Progressing     Problem: Compromised Friction/Shear  Goal: Friction and Shear Interventions  Outcome: Progressing     Problem: Moderate/High Fall Risk Score >5  Goal: Patient will remain free of falls  Outcome: Progressing     Problem: Everyday - Heart Failure  Goal: Stable Vital Signs and Fluid Balance  Outcome: Progressing  Goal: Mobility/Activity is Maintained at Optimal Level for Patient  Outcome: Progressing  Goal: Nutritional Intake is Adequate  Outcome: Progressing     Problem: Hemodynamic Status: Cardiac  Goal: Stable vital signs and fluid balance  Outcome: Progressing     Problem: Inadequate Tissue Perfusion  Goal: Adequate tissue perfusion will be maintained  Outcome: Progressing     Problem: Ineffective Gas Exchange  Goal: Effective breathing pattern  Outcome: Progressing

## 2023-07-04 NOTE — Progress Notes (Signed)
[facility] HEART CARDIOLOGY PROGRESS NOTE    [facility]  INTENSIVE CARE [place]  [address]  [place] [address]  Dept: [phone]  Loc: [phone]    Date Time: 07/04/23 11:51 AM  Patient Name: [facility] Day: 4    Subjective/Cardiac Relevant Events:   Laying flat do dyspnea.  PA diastolics somewhat oscillating again though now improved versus yesterday.  Diuresed better with metolazone x 1 yesterday (first time use)    ASSESSMENT:   Patient is a 77 y.o. 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    Tentative plan for MitraClip  Severe functional tricuspid regurgitation  CAD with reported PCI in 2011  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  Stage IV CLL, diagnosed in September 2021, 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   HTN  HLD  Diabetes  History of RLE DVT       RECOMMENDATIONS:     Dose metolazone 2.5 mg x 1 again today  Hydralazine was increased today to 75 mg q8hr  Okay to reduce Bumex drip to 0.5 mg/h and dobutamine drip to 2.5 mg  Continue trending lactate/cardiac output, likely [place] PA catheter tomorrow  Advanced CHF team to see again tomorrow    Telemetry/Labs/Intake-Output:     Telemetry reviewed: V paced    Recent Labs     07/04/23  0558 07/04/23  0303 07/04/23  0007 07/03/23  1826 07/03/23  1232   Sodium 137  --    < > 135 136   Potassium 4.0  --    < > 4.2 4.3   Chloride 105  --    < > 105 105   CO2 20  --    < > 19 20   BUN 56*  --     < > 51* 48*   Creatinine 3.5*  --    < > 3.4* 3.2*   Calcium 9.0  --    < > 8.4 8.4   Magnesium  --   --   --  2.4 2.4   WBC  --  7.19  --   --   --    Hemoglobin 9.9* 10.1*   < > 10.0* 9.7*   Hematocrit 30.1* 31.0*   < > 32.0* 30.5*   Platelet Count  --  68*  --   --   --    AST (SGOT)  --   --   --   --  22   ALT  --   --   --   --  35    < > = values in this interval not displayed.         Intake/Output Summary (Last 24 hours) at 07/04/2023 1151  Last data filed at 07/04/2023 1000  Gross per 24 hour   Intake 1838.91 ml   Output 2700 ml   Net -861.09 ml       Medications:      Scheduled Meds: PRN  Meds:    atorvastatin, 80 mg, Oral, Daily  hydrALAZINE, 75 mg, Oral, Q8H SCH  insulin glargine, 15 Units, Subcutaneous, QHS  insulin glargine, 15 Units, Subcutaneous, QAM  insulin lispro, 1-6 Units, Subcutaneous, QHS  insulin lispro, 2-10 Units, Subcutaneous, TID AC  pantoprazole, 40 mg, Oral, QAM AC        Continuous Infusions:   amiodarone 0.5 mg/min (07/04/23 0107)    bumetanide 1 mg/hr (07/04/23 0253)    DOBUTamine 5 mcg/kg/min (07/03/23 1831)    nitroprusside Stopped (07/03/23 0051)    acetaminophen, 650 mg, Q4H PRN  alum & mag hydroxide-simethicone, 30 mL, Q4H PRN  benzonatate, 200 mg, TID PRN  guaiFENesin-dextromethorphan, 10 mL, Q4H PRN  melatonin, 3 mg, QHS PRN  naloxone, 0.2 mg, PRN  ondansetron, 4 mg, Q6H PRN  senna-docusate, 2 tablet, QHS PRN  traZODone, 50 mg, QHS PRN          Physical Exam:     Vitals:    07/04/23 0900 07/04/23 0930 07/04/23 1000 07/04/23 1100   BP:  107/73 118/81 104/74   Pulse: 78 80 80 74   Resp: 13 13 17 22    Temp:       TempSrc:       SpO2: 96% 97% 99% 99%   Weight:       Height:             06/26/2023 06/29/2023 06/30/2023 07/01/2023 07/02/2023 07/03/2023 07/04/2023   Weight Monitoring   Height 182.9 cm 182.9 cm  182.9 cm    182.9 cm    182.9 cm    182.9 cm    182.9 cm 182.9 cm    182.9 cm    182.9 cm    182.9 cm 182.9 cm    182.9 cm    182.9 cm    182.9 cm 182.9 cm    182.9 cm   Height  Method Stated Stated        Weight 93.8 kg    --  90 kg    87.4 kg 90.4 kg    77.9 kg  88.4 kg 85.7 kg    91 kg    Weight Method Bed Scale Standing Scale    Bed Scale Standing Scale  Bed Scale Bed Scale    BMI (calculated) 28 kg/m2 26.9 kg/m2    26.1 kg/m2 27 kg/m2    23.3 kg/m2  26.4 kg/m2 25.6 kg/m2    27.2 kg/m2        Significant value    Multiple values from one day are sorted in reverse-chronological order       Constitutional: Cooperative, alert, no acute distress.  Neck: No carotid bruits, JVP normal.  Cardiac: Regular rate and rhythm, normal S1 and S2; no S3 or S4, no rubs, no gallops, no murmurs  Pulmonary: Diminished breath sound  Extremities: 1+ peripheral edema  Vascular: +2 pulses in radial artery bilaterally, 2+ pedal pulses bilaterally.        -------------------------------------------------------------------------------------  Signed by:         Clarice Pole, MD         [facility]    [facility] Heart Contact Information   [facility]  Secure Chat (Group):   [facility] [place] [facility]    APP Spectralink:  [phone]    MD Spectralink :  [phone]  [phone]    After hours, non urgent consult line:  [phone]    After hours, physician on-call:  [phone] [facility]  Secure Chat (Group):  [place] [facility]    APP Spectralink:  [phone]    MD Spectralink :  [phone]      After hours, non urgent consult line:  [phone]    After hours, physician on-call:  [phone] [facility]  Secure Chat (Group):   [facility] [place] [facility]    APP Spectralink:  [phone]    MD Spectralink :  [phone]      After hours, non urgent consult line:  [phone]    After hours, physician on-call:  [phone] [facility]  Secure Chat (Group):   [place]  Heart    APP Spectralink:  [phone]    MD Spectralink :  [phone]      After hours, non urgent consult line:  [phone]    After hours, physician on-call:  [phone]       This note was generated by the Dragon speech recognition  and may contain errors or omissions not intended by the user. Grammatical errors, random word insertions, deletions, pronoun errors, and incomplete sentences are occasional consequences of this technology due to software limitations. Not all errors are caught or corrected. If there are questions or concerns about the content of this note or information contained within the body of this dictation, they should be addressed directly with the author for clarification.

## 2023-07-05 ENCOUNTER — Inpatient Hospital Stay: Payer: Commercial Managed Care - POS

## 2023-07-05 ENCOUNTER — Ambulatory Visit (INDEPENDENT_AMBULATORY_CARE_PROVIDER_SITE_OTHER): Payer: Commercial Managed Care - POS

## 2023-07-05 ENCOUNTER — Other Ambulatory Visit (INDEPENDENT_AMBULATORY_CARE_PROVIDER_SITE_OTHER): Payer: Commercial Managed Care - POS

## 2023-07-05 ENCOUNTER — Institutional Professional Consult (permissible substitution) (INDEPENDENT_AMBULATORY_CARE_PROVIDER_SITE_OTHER): Payer: Commercial Managed Care - POS | Admitting: Thoracic Surgery (Cardiothoracic Vascular Surgery)

## 2023-07-05 DIAGNOSIS — Z710 Person encountering health services to consult on behalf of another person: Secondary | ICD-10-CM

## 2023-07-05 DIAGNOSIS — Z7189 Other specified counseling: Secondary | ICD-10-CM

## 2023-07-05 DIAGNOSIS — I340 Nonrheumatic mitral (valve) insufficiency: Secondary | ICD-10-CM

## 2023-07-05 LAB — BASIC METABOLIC PANEL
Anion Gap: 12 (ref 5.0–15.0)
Anion Gap: 13 (ref 5.0–15.0)
Anion Gap: 14 (ref 5.0–15.0)
BUN: 66 mg/dL — ABNORMAL HIGH (ref 9–28)
BUN: 67 mg/dL — ABNORMAL HIGH (ref 9–28)
BUN: 71 mg/dL — ABNORMAL HIGH (ref 9–28)
CO2: 18 meq/L (ref 17–29)
CO2: 19 meq/L (ref 17–29)
CO2: 20 meq/L (ref 17–29)
Calcium: 8.7 mg/dL (ref 7.9–10.2)
Calcium: 8.7 mg/dL (ref 7.9–10.2)
Calcium: 8.7 mg/dL (ref 7.9–10.2)
Chloride: 102 meq/L (ref 99–111)
Chloride: 103 meq/L (ref 99–111)
Chloride: 103 meq/L (ref 99–111)
Creatinine: 3.8 mg/dL — ABNORMAL HIGH (ref 0.5–1.5)
Creatinine: 3.8 mg/dL — ABNORMAL HIGH (ref 0.5–1.5)
Creatinine: 4 mg/dL — ABNORMAL HIGH (ref 0.5–1.5)
GFR: 14.8 mL/min/{1.73_m2} — ABNORMAL LOW (ref >=60.0)
GFR: 15.7 mL/min/{1.73_m2} — ABNORMAL LOW (ref >=60.0)
GFR: 15.7 mL/min/{1.73_m2} — ABNORMAL LOW (ref >=60.0)
Glucose: 135 mg/dL — ABNORMAL HIGH (ref 70–100)
Glucose: 199 mg/dL — ABNORMAL HIGH (ref 70–100)
Glucose: 230 mg/dL — ABNORMAL HIGH (ref 70–100)
Potassium: 3.7 meq/L (ref 3.5–5.3)
Potassium: 4.1 meq/L (ref 3.5–5.3)
Potassium: 4.3 meq/L (ref 3.5–5.3)
Sodium: 134 meq/L — ABNORMAL LOW (ref 135–145)
Sodium: 134 meq/L — ABNORMAL LOW (ref 135–145)
Sodium: 136 meq/L (ref 135–145)

## 2023-07-05 LAB — COOXIMETRY PROFILE
Carboxyhemoglobin: 1.2 % (ref 0.4–2.7)
Carboxyhemoglobin: 1.2 % (ref 0.4–2.7)
Hematocrit Total Calculated: 31.1 % — ABNORMAL LOW (ref 40.0–54.0)
Hematocrit Total Calculated: 31.1 % — ABNORMAL LOW (ref 40.0–54.0)
Hemoglobin Total: 10.1 g/dL — ABNORMAL LOW (ref 13.0–17.0)
Hemoglobin Total: 10.2 g/dL — ABNORMAL LOW (ref 13.0–17.0)
Methemoglobin: 0.7 % (ref 0.0–1.5)
Methemoglobin: 0.7 % (ref 0.0–1.5)
Oxygenated Hemoglobin: 42.7 % — ABNORMAL LOW (ref 94.0–98.0)
Oxygenated Hemoglobin: 46.3 % — ABNORMAL LOW (ref 94.0–98.0)

## 2023-07-05 LAB — MAGNESIUM
Magnesium: 2.5 mg/dL (ref 1.6–2.6)
Magnesium: 2.5 mg/dL (ref 1.6–2.6)
Magnesium: 2.6 mg/dL (ref 1.6–2.6)

## 2023-07-05 LAB — CBC
Absolute nRBC: 0.03 10*3/uL — ABNORMAL HIGH (ref <=0.00)
Hematocrit: 29.9 % — ABNORMAL LOW (ref 37.6–49.6)
Hemoglobin: 9.6 g/dL — ABNORMAL LOW (ref 12.5–17.1)
MCH: 27.6 pg (ref 25.1–33.5)
MCHC: 32.1 g/dL (ref 31.5–35.8)
MCV: 85.9 fL (ref 78.0–96.0)
Platelet Count: 67 10*3/uL — ABNORMAL LOW (ref 142–346)
RBC: 3.48 10*6/uL — ABNORMAL LOW (ref 4.20–5.90)
RDW: 18 % — ABNORMAL HIGH (ref 11–15)
WBC: 7.47 10*3/uL (ref 3.10–9.50)
nRBC %: 0.4 /100{WBCs} — ABNORMAL HIGH (ref <=0.0)

## 2023-07-05 LAB — WHOLE BLOOD GLUCOSE POCT
Whole Blood Glucose POCT: 258 mg/dL — ABNORMAL HIGH (ref 70–100)
Whole Blood Glucose POCT: 121 mg/dL — ABNORMAL HIGH (ref 70–100)
Whole Blood Glucose POCT: 161 mg/dL — ABNORMAL HIGH (ref 70–100)
Whole Blood Glucose POCT: 177 mg/dL — ABNORMAL HIGH (ref 70–100)
Whole Blood Glucose POCT: 227 mg/dL — ABNORMAL HIGH (ref 70–100)

## 2023-07-05 LAB — HEMOGLOBIN AND HEMATOCRIT
Hematocrit: 30.9 % — ABNORMAL LOW (ref 37.6–49.6)
Hemoglobin: 9.8 g/dL — ABNORMAL LOW (ref 12.5–17.1)

## 2023-07-05 LAB — LACTIC ACID
Whole Blood Lactic Acid: 0.7 mmol/L (ref 0.2–2.0)
Whole Blood Lactic Acid: 1 mmol/L (ref 0.2–2.0)

## 2023-07-05 MED ORDER — AMIODARONE HCL 200 MG PO TABS
400.0000 mg | ORAL_TABLET | Freq: Two times a day (BID) | ORAL | Status: DC
Start: 2023-07-05 — End: 2023-07-07
  Administered 2023-07-05 – 2023-07-07 (×5): 400 mg via ORAL
  Filled 2023-07-05 (×5): qty 2

## 2023-07-05 MED ORDER — ISOSORBIDE DINITRATE 10 MG PO TABS
10.0000 mg | ORAL_TABLET | Freq: Three times a day (TID) | ORAL | Status: DC
Start: 2023-07-05 — End: 2023-07-07
  Administered 2023-07-05 – 2023-07-07 (×6): 10 mg via ORAL
  Filled 2023-07-05 (×12): qty 1

## 2023-07-05 MED ORDER — HYDROCORTISONE (PERIANAL) 2.5 % EX CREA
TOPICAL_CREAM | Freq: Two times a day (BID) | CUTANEOUS | Status: DC
Start: 2023-07-05 — End: 2023-07-07
  Filled 2023-07-05: qty 30

## 2023-07-05 MED ORDER — LORAZEPAM 1 MG PO TABS
1.0000 mg | ORAL_TABLET | Freq: Every evening | ORAL | Status: DC
Start: 2023-07-05 — End: 2023-07-05
  Administered 2023-07-05: 1 mg via ORAL
  Filled 2023-07-05: qty 1

## 2023-07-05 MED ORDER — BUMETANIDE 0.25 MG/ML IJ SOLN
2.0000 mg | Freq: Two times a day (BID) | INTRAMUSCULAR | Status: DC
Start: 2023-07-05 — End: 2023-07-07
  Administered 2023-07-05 – 2023-07-07 (×6): 2 mg via INTRAVENOUS
  Filled 2023-07-05 (×5): qty 10

## 2023-07-05 MED ORDER — SENNOSIDES-DOCUSATE SODIUM 8.6-50 MG PO TABS
1.0000 | ORAL_TABLET | Freq: Two times a day (BID) | ORAL | Status: DC
Start: 2023-07-05 — End: 2023-07-07
  Administered 2023-07-05 – 2023-07-07 (×4): 1 via ORAL
  Filled 2023-07-05 (×4): qty 1

## 2023-07-05 MED ORDER — POLYETHYLENE GLYCOL 3350 17 G PO PACK
17.0000 g | PACK | Freq: Every day | ORAL | Status: DC | PRN
Start: 2023-07-06 — End: 2023-07-07

## 2023-07-05 MED ORDER — ZOLPIDEM TARTRATE 5 MG PO TABS
2.5000 mg | ORAL_TABLET | Freq: Once | ORAL | Status: AC
Start: 2023-07-05 — End: 2023-07-05
  Administered 2023-07-05: 2.5 mg via ORAL
  Filled 2023-07-05: qty 1

## 2023-07-05 MED ORDER — POLYETHYLENE GLYCOL 3350 17 G PO PACK
17.0000 g | PACK | Freq: Once | ORAL | Status: AC
Start: 2023-07-05 — End: 2023-07-05
  Administered 2023-07-05: 17 g via ORAL
  Filled 2023-07-05: qty 1

## 2023-07-05 MED ORDER — AMIODARONE HCL 200 MG PO TABS
400.0000 mg | ORAL_TABLET | Freq: Every day | ORAL | Status: DC
Start: 2023-07-10 — End: 2023-07-07

## 2023-07-05 MED ORDER — BUMETANIDE 0.25 MG/ML IJ SOLN
2.0000 mg | Freq: Once | INTRAMUSCULAR | Status: AC
Start: 2023-07-05 — End: 2023-07-05
  Administered 2023-07-05: 2 mg via INTRAVENOUS
  Filled 2023-07-05: qty 10

## 2023-07-05 NOTE — Progress Notes (Signed)
CM Progress note:    ECIN referral placed to [facility] per request of Willette Cluster LCSW Palliative Team.   CM will follow for assist.     Marcelene Weidemann [facility] RN  [facility]  Case Manager  [phone]

## 2023-07-05 NOTE — Nursing Progress Note (Signed)
Latest Reference Range & Units 07/05/23 05:46   Creatinine 0.5 - 1.5 mg/dL 3.8 (H)   (H): Data is abnormally high   Latest Reference Range & Units 07/05/23 05:46   Potassium 3.5 - 5.3 mEq/L 3.7     Dr. Lesle Reek notified

## 2023-07-05 NOTE — Progress Notes (Signed)
[place] HEART CARDIOLOGY PROGRESS NOTE    [facility]  INTENSIVE CARE [place]  [address]  [place] [address]  Dept: [phone]  Loc: [phone]    Date Time: 07/05/23 6:53 AM  Patient Name: [name] [name] Day: 5    Subjective/Cardiac Relevant Events:   Latest RHC #s from 6 AM: MAP 65, RA 13, 48/14 (30), Wedge 14-17 based on prior correlations, CO/CI 3.8/18 by Fick, PA Sat 43%, CPO 0.6, PAPI 2.6, API at 2.5, SVR 1100  BUN/Cr at 67/3.8 from nadir on 9/13 48/3.1 but peak of 62/4.1 on admit    ASSESSMENT:   Patient is a 77 y.o. 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 SVR 2640. CPO 0.3, PAPI at 1.05. API 1.   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   HTN  HLD  Diabetes  History of RLE DVT    RECOMMENDATIONS:     Cardiogenic Shock SCAI Stage C, Stage D Dilated Cardiomyopathy with NYHA Class IIIb/IV symptoms as an OP  Next set of RHC #s please let me know when obtaining and will assess with you  Likely swan out today and will review with AHFT - good signs include improving CPO, PAPI and API but want to verify #s  Continue Dobutamine 2.5 mcg/kg/min (significant NSVT at higher  doses)  Continue Hydral 75 mg TID and will add ISDN 10 mg TID today  Switch to Amio load to finish 10 grams - has gotten about 5 grams so far  Will aim for even fluid balance today and likely will need Bumex 2 mg IV BID  Overall seems to be headed towards needing palliative inotropes - will review with AHFT  Severe Functional Mitral Regurgitation  There was some preliminary discussion/review for MitraClip  Will revisit discussion with Heart Team - this seems like it will be poorly tolerated in terms of afterload on the LV    Telemetry/Labs Reviewed/Intake-Output:     Telemetry reviewed: Intermittent PVCs    Recent Labs   Lab 06/29/23  1914 06/29/23  1703   hs Troponin 67.9* 58.2*             Recent Labs   Lab 07/03/23  1232   Bilirubin, Total 1.3*   Bilirubin Direct 0.8*   Protein, Total 6.4   Albumin 3.3*   ALT 35   AST (SGOT) 22     Recent Labs   Lab 07/05/23  0546   Magnesium 2.6     Recent Labs   Lab 06/29/23  1914   PTT 27     Recent Labs   Lab 07/05/23  0500 07/04/23  2340 07/04/23  1809 07/04/23  0558 07/04/23  0303 07/03/23  1232 07/03/23  0549   WBC 7.47  --   --   --  7.19  --  6.82   Hemoglobin 9.6* 9.8* 9.9*  More results in Results Review 10.1*  More results in Results Review 9.8*   Hematocrit 29.9* 30.1* 30.5*  More results in Results Review 31.0*  More results in Results Review 30.5*   Platelet Count 67*  --   --   --  68*  --  70*   More results in Results Review = values in this interval not displayed.     Recent Labs   Lab 07/05/23  0546 07/04/23  2340 07/04/23  1809   Sodium 136 134* 134*   Potassium 3.7 4.1 4.2   Chloride 103 103 101   CO2 20 19 21    BUN 67* 66* 64*   Creatinine 3.8* 3.8* 3.7*   GFR 15.7* 15.7* 16.2*   Glucose 135* 230* 293*   Calcium 8.7 8.7 8.7           Invalid input(s): "FREET4"    .  Lab Results   Component Value Date    BNP 10,479 (H) 03/24/2023          Intake/Output Summary (Last 24 hours) at 07/05/2023 0653  Last data filed at 07/05/2023 0625  Gross per 24 hour   Intake  1264.57 ml   Output 1400 ml   Net -135.43 ml       Medications:      Scheduled Meds: PRN Meds:    atorvastatin, 80 mg, Oral, Daily  hydrALAZINE, 75 mg, Oral, Q8H SCH  insulin glargine, 15 Units, Subcutaneous, QHS  insulin glargine, 15 Units, Subcutaneous, QAM  insulin lispro, 1-6 Units, Subcutaneous, QHS  insulin lispro, 2-10 Units, Subcutaneous, TID AC  pantoprazole, 40 mg, Oral, QAM AC        Continuous Infusions:   amiodarone 0.5 mg/min (07/04/23 2323)    DOBUTamine 2.5 mcg/kg/min (07/04/23 1335)    acetaminophen, 650 mg, Q4H PRN  alum & mag hydroxide-simethicone, 30 mL, Q4H PRN  benzonatate, 200 mg, TID PRN  guaiFENesin-dextromethorphan, 10 mL, Q4H PRN  melatonin, 3 mg, QHS PRN  naloxone, 0.2 mg, PRN  ondansetron, 4 mg, Q6H PRN  senna-docusate, 2 tablet, QHS PRN  traZODone, 50 mg, QHS PRN          Physical Exam:     Vitals:    07/05/23 0606 07/05/23 0607 07/05/23 0608 07/05/23 0609   BP:       Pulse: 73 73 80 80   Resp: (!) 23 (!) 23 18 19    Temp:       TempSrc:       SpO2: 97% 98% 97% 97%   Weight:       Height:             06/29/2023 06/30/2023 07/01/2023 07/02/2023 07/03/2023 07/04/2023 07/05/2023   Weight Monitoring   Height 182.9 cm  182.9 cm    182.9 cm    182.9 cm    182.9 cm    182.9 cm 182.9 cm    182.9 cm    182.9 cm    182.9 cm 182.9 cm    182.9 cm    182.9 cm    182.9 cm 182.9 cm    182.9 cm    182.9 cm    182.9 cm 182.9 cm    182.9 cm   Height Method Stated  Weight 90 kg    87.4 kg 90.4 kg    77.9 kg  88.4 kg 85.7 kg    91 kg     Weight Method Standing Scale    Bed Scale Standing Scale  Bed Scale Bed Scale     BMI (calculated) 26.9 kg/m2    26.1 kg/m2 27 kg/m2    23.3 kg/m2  26.4 kg/m2 25.6 kg/m2    27.2 kg/m2         Multiple values from one day are sorted in reverse-chronological order       Constitutional: Cooperative, alert, no acute distress.  Neck: No carotid bruits, JVP not well seen.   Cardiac: Regular rate and regular rhythm, normal S1 and S2; +S3. Murmur: holosystolic at apex  Pulmonary:  Normal Effort. No Rales. No wheezing. No Rhonchi.  Extremities: Trace edema. Warm  Vascular: +2 pulses in radial artery bilaterally - with a-line in place, 2+ pedal pulses bilaterally.    -------------------------------------------------------------------------------------  Signed by:     Alda Ponder, MD   [facility]    [facility] Heart Contact Information   [facility]  Secure Chat (Group):   [facility] [place] [facility]    APP Spectralink:  [phone]    MD Spectralink :  [phone]  [phone]    After hours, non urgent consult line:  [phone]    After hours, physician on-call:  [phone] [facility]  Secure Chat (Group):   [facility] [place] [facility]    APP Spectralink:  [phone]    MD Spectralink :  [phone]      After hours, non urgent consult line:  [phone]    After hours, physician on-call:  [phone] [facility]  Secure Chat (Group):   [facility] [place] [facility]    APP Spectralink:  [phone]    MD Spectralink :  [phone]      After hours, non urgent consult line:  [phone]    After hours, physician on-call:  [phone] [facility]  Secure Chat (Group):   [facility] [place] [facility]    APP Spectralink:  [phone]    MD Spectralink :  [phone]      After hours, non urgent consult line:  [phone]    After hours, physician on-call:  [phone]       This note was generated by the Dragon speech recognition and may contain errors or omissions not intended by the user. Grammatical errors, random word insertions, deletions, pronoun errors, and incomplete sentences are occasional consequences of this technology due to software limitations. Not all errors are caught or corrected. If there are questions or concerns about the content of this note or information contained within the body of this dictation, they should be addressed directly with the author for clarification.

## 2023-07-05 NOTE — Progress Notes (Signed)
Palliative Medicine & Comprehensive Care  Service Phone Number: [place]: [phone], Mon-Fri 9a-4p    Clinical Therapist - Initial Assessment     Date of Admission: 06/29/2023  Room/Bed: IC10/IC10-A    Situation: Patient with his spouse [name] [name] at his bedside, joint visit with Palliative Care Nurse Practitioner [name] [name].      Background: [name] [name] is a 77 y.o. male admitted with Acute on chronic congestive heart failure, unspecified heart failure type.     Assessment: Provided introduction of Palliative Clinical Therapist and explanation of role. Patient discusses frustration with hearing his numbers are positive by some, not doing well from others. Patient discusses his resilience, shares that his mother lived for many years with heart issues.  Described the differences between palliative care and hospice care.      Patient's Heart Failure physician arrived, Dr. Joneen [name], discussed patient's condition with patient and spouse.  After discussion of his status, patient discusses interest in hospice care at home, communicates he is eager to get home.  Patient shares of concern for his wife [name] [name], discussing that it is just them locally.  Patient shares that his family lives on the [place] and that they also have family in [place].      Discussion that [facility] serves his area in [place].  Patient discusses that he looks forward to going home as soon as he is able with hospice services.  Patient shares he wasn't expected to receive the news he got today regarding his heart. He discusses that prior to his hospitalization he could not sleep and that he has difficulty here in the hospital.        Care Network:  Primary caregiver: spouse [name] [name]  Support system: spouse [name] [name]  Living situation: private home in [place], one level, has adjustable bed  Community resources: has used home health previously    Psychosocial Factors:  Grief/bereavement: patient shares of  not expecting to news he learned regarding his heart  Depression/demoralization: patient shares of preferring positive outlook  Anxiety: patient shares of concern for his wife as he declines, it's just them locally.      Spiritual/Socio-Cultural Factors:  Spirituality/religion: Baptist  Is this a resource?: yes-patient shares of reading scripture, enjoyed conversation with a chaplain previously regarding resilience.    Cultural/value systems: patient shares of wanting to be at home with his wife, shares of his faith and belief in resilience, discusses he does not watch television and prefers to read    Advance Care Planning:  Code status: NO CPR  -  ALLOW NATURAL DEATH   Advance Directive: Patient does not have advance directive  Health care decision maker: Per Code of [place] 54.10-2984 for order of priority in absence of an advance directive, decisions defer to spouse [name] [name] in situations in which the pt is unable to make decisions.     Interventions:  Evidence-Based Clinical Interventions provided during this admission? Yes - Established patient/family goals of care, Brief solution-focused therapy, Psychological education, End-of-life counseling / Hospice Education, Non-pharmacologic symptom management, Care coordination, Biopsychosocial Assessment, and Provided Psychosocial Support      Recommendations/Plan:  Patient interested in returning home with hospice care as soon as he is able.    Palliative Clinical Therapist plans to continue to see patient for ongoing assessment and provision of evidenced based therapeutic interventions as appropriate and desired throughout pt's hospitalization.       [name] Dickerman A. Gerhard [name], ACHP-SW  Behavioral Health Therapist  Palliative and Comprehensive  Care  *please contact via Epic chat.

## 2023-07-05 NOTE — Consults (Signed)
[facility] Palliative Medicine & Comprehensive Care  [facility]  Service Phone Number: [phone]     Palliative Care Consult   Date Time: 07/05/23 1:02 PM   Patient Name: [name] [name]   Location: IC10/IC10-A   Attending Physician: Allegra Grana, MD   Primary Care Physician: Zorita Pang, MD   Consulting Provider: Lannie Fields, NP   Consulting Service: Palliative Medicine and Comprehensive Care  Consult request from Allegra Grana, MD to see patient regarding:   Reason for Referral: Advance care planning; Clarify goals of care     Palliative Diagnosis Category: Cardiac  Patient Type: Return       Assessment & Plan   Impression   [name] [name] 77 y.o. 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  Cardiology documents that he is not a candidate for transplant nor temporary mechanical circulatory support nor home based IV inotropes-Milrinone or dobutamine. Cardiology recommends hospice services and palliative care to assist with ACP and GoC discussions.    Estimated Prognosis: Unclear     Recommendations   1. Goals of Care:   [name] [name] has decided to focus his care on comfort and quality of life.  He is interested in home hospice support.  Referral made to [facility] which serves his area in [place], [place].  He is hoping to go home tomorrow, 9/17.  Code Status changed to No CPR Allow Natural Death.  See details of the visit below.    2. Physical Aspects of Care:   Pain: none  Bowel Habits: Last bowel movement: 9/16  *Bowel Regimen: senna/docusate 8.6-50mg , 1 tablet po BID  Dyspnea - none, no supplemental oxygen  Appetite/Nutrition - regular diet, heart health/consistent carbohydrate  Nausea/Vomiting - none    3. Psychological Aspects of Care  Mood - pleasant  Anxiety - confirms feeling concerned about his wife and his unknown future.  Depression -  none reported today  Sleep Disturbances - reports inability to sleep at night.  He described not being able to go to sleep until early morning hours.  Started lorazepam 1mg  po QHS to assist with sleep.  Delirium - none      4. Social Aspects of Care:  [name] [name] resides with his wife, Lajoyce Corners, in [place], [place].  They have no family nearby.  They have neighbors who are kind but have a limited support.    See additional details in notes by EHatch-LCSW.      5. Spiritual Aspects of Care: Baptist. [name] having a strong faith in God and that is how he gains a great amount of strength.  He agrees to a hospital chaplain visit - request placed.      6. Palliative Care Team follow-up plans: tomorrow      Discharge Disposition: Hospice - home      Outpatient Follow Up Recommended: No    Outcomes: Family meetings, Clarified goals of care, Counseled regarding hospice, Provided psychosocial or spiritual support, and Changed CPR status    Total time of 109 minutes spent today in care of [name] [name] including medical record review, clinical assessment and management, family counseling, and coordination of care with Patient, Family, Consultants, referring provider, Bedside Nurse, Charge Nurse, and Case manager regarding recommendations above.    Start Time:       1:23pm  Stop Time:   2:42pm  Chart Review, patient/wife visit, care team collaboration    Start Time:        5:00pm             Stop Time:   5:30pm  Documentation    Lannie Fields, NP, MD/NP  Palliative Medicine & Comprehensive Care  Phone Number: [phone] or secure chat, M-F, 9a-4p     History of Presenting Illness   [name] [name] is a 77 y.o. male admitted to hospital on 06/29/2023 with Chronic kidney disease, unspecified CKD stage [N18.9]  Acute on chronic congestive heart failure, unspecified heart failure type [I50.9]     [name] [name] found sitting up in bed finishing lunch with his wife at bedside.  He  appears comfortable and in no distress.     Advance Care Planning   Goals of Care   ACP Validation: Not addressed    ACP Document: None    Surrogate Decision Maker: Next of Kin status: wife: Eddie Ilacqua  Contact information: [phone]    CODE STATUS: Full Code   Discussed the details of CPR/intubation.  [name] [name] verbalized not wanting to go through the vigorous process of resuscitation.  He agrees to changing his code status to NO CPR Allow Natural Death.  He also agrees to turning off of the implantable defibrillator-order requested.    Advanced Care Planning:      Decisional Capacity: yes      Advance Directives have been completed in the past: no      Advance Directives are available in chart: no      Discussed this admission:  not during this visit      ACP note completed: no       Date:     GOC Discussion:   Met Mr. And Mrs. [name] at bedside with EHatch-LCSW.    Introduced palliative care and explained the concepts of support:  1-Expert pain/symptom management     2-Communication  -facilitation of discussions about what is most important/goals of care  -reviewing the medical treatments and making sure that they align with wishes for care  -advance directive review, and completion if necessary     3-Transitions of Care  -collaborate with case management as the plans for post hospital care are developed and to make sure that the goal of care is clear and plans align.    [name] [name] states, "my heart is bad".  He then explained his frustration about hearing inconsistent reports and information about his condition from different providers.  Reviewed the basic facts about the end stage heart disease and his not being a candidate for advanced heart procedures or infusions.  Dr. Ninfa Linden joined the end of the visit and provided an overview of [name] [name]' heart disease.    Goals of Care discussed in terms of comfort/quality of life vs function/independence vs longevity.      Provided details about the  plan of care for each:   1-continuing of a more aggressive/longevity goal with palliative care support.    2-comfort focused plan of care alignment with hospice services.  Provided details about hospice care.    What matters most right now?  Mr. Mascioli was clear that he wants to go home as soon as possible.  He verbalized that hospice care sounds good to him.  He wants to pursue comfort and quality of life is most important.    Are there specific goals you are hoping to achieve? Mr. Ratley  states that he has been praying for two more years (of life), "I think I'm going to make it, I have faith".      Outcome of Discussion: hospice and comfort care        Palliative Functional and Symptom Assessment      ADLs prior to admission:    Independent Needs assistance Dependent   Ambulation [x]  []  []    Transferring [x]  []  []    Dressing [x]  []  []    Bathing [x]  []  []    Toileting [x]  []  []    Feeding [x]  []  []      Palliative Performance Scale: 50% - Mainly sit/lie, unable to do any work, extensive disease, considerable assistance required with self-care, normal or reduced intake, full LOC or confusion    FAST Score (Dementia Patients): N/A    ECOG (Cancer Patients): N/A    [place] Symptom Assessment Scale (ESAS): Unable to assess         Review of Systems       Review of Systems   Constitutional:  Positive for fatigue.   HENT:  Negative for hearing loss and trouble swallowing.    Respiratory:  Positive for cough. Negative for shortness of breath.    Cardiovascular:  Negative for leg swelling.   Gastrointestinal: Negative.    Genitourinary: Negative.    Musculoskeletal: Negative.         Reduced mobility r/t low energy and fatigue.   Neurological: Negative.    Psychiatric/Behavioral:  Positive for sleep disturbance. The patient is nervous/anxious.              Past Medical, Surgical and Family History   Medical History[1]   Past Surgical History:   Procedure Laterality Date    BIV  11/10/2016    ICD METRONIC  UPGRADE      CARDIAC CATHETERIZATION  03/2010    CORONARY STENT PLACEMENT X 2 PER PT; LM normal, LAD 70-80% distal lesion at apex, 20% lesion mid CFX    CARDIAC CATHETERIZATION  12/2012    CARDIAC DEFIBRILLATOR PLACEMENT  12/2012    [facility]    CARDIAC PACEMAKER PLACEMENT  2014    MEDTRONIC ICD/PACEMAKER PLACED [facility]    CIRCUMCISION  AGE 71    COLONOSCOPY, DIAGNOSTIC (SCREENING)  2009    CORRECTION HAMMER TOE      duodenal ulcer  1973    STRESS RELATED IN SCHOOL    ECHOCARDIOGRAM, TRANSESOPHAGEAL N/A 04/21/2023    Procedure: ECHOCARDIOGRAM, TRANSESOPHAGEAL;  Surgeon: George Hugh, MD;  [facility];  Service: Cardiology;  Laterality: N/A;  Probe #[id number]    ECHOCARDIOGRAM, TRANSTHORACIC  02/2016 11/2016 09/2017 12/2017    ECHOCARDIOGRAM, TRANSTHORACIC  02/2016,11/2016,12/20/2017    EGD N/A 08/15/2014    Procedure: EGD;  Surgeon: Colon Branch, MD;  Location: [facility];  Service: Gastroenterology;  Laterality: N/A;  egd w/ bx    EGD  1975    EGD  02/11/2023    Procedure: EGD and clip placement;  Surgeon: Doyne Keel, MD;  Location: [facility] OR;  Service: Gastroenterology;;    EGD, COLONOSCOPY N/A 04/04/2018    Procedure: EGD with bxs, COLONOSCOPY with polypectomy and clipping;  Surgeon: Doyne Keel, MD;  Location: [facility];  Service: Gastroenterology;  Laterality: N/A;    FRACTURE SURGERY  AGE 49     RT  ANKLE- CAST APPLIED    HERNIA REPAIR  AGE 71    LEFT INGUINAL HERNIA    MPI nuclear study  06/2016    orthopedic surgery  AGE 34    RT foot corrective - HAMMERTOE    REPAIR, HAMMERTOE      RIGHT HEART CATH Right 09/09/2022    Procedure: Right Heart Cath;  Surgeon: Burna Forts, MD;  Location: [facility];  Service: Cardiovascular;  Laterality: Right;  tx from FO    RIGHT HEART CATH Right 06/30/2023    Procedure: Right Heart Cath;  Surgeon: Belinda Fisher, MD;  Location: [facility];  Service: Cardiovascular;  Laterality: Right;    RIGHT HEART CATH  ONLY INCL. CO AND SATS Right 12/22/2022    Procedure: Right Heart Cath ONLY incl. CO and Sats;  Surgeon: Day, Bertram Millard, MD;  Location: [facility];  Service: Cardiovascular;  Laterality: Right;  Right Heart Cath with leave in PA cath for milrinone initiation    TONSILLECTOMY AND ADENOIDECTOMY  1954    TUNNELED CATH PLACEMENT ([facility]ATH) N/A 12/25/2022    Procedure: [facility];  Surgeon: Tamala Bari, MD;  Location: [facility];  Service: Interventional Radiology;  Laterality: N/A;    TUNNELED CATH REMOVAL ([facility]ATH) Right 01/04/2023    Procedure: TUNNELED CATH REMOVAL;  Surgeon: Rex Kras, MD;  Location: [facility];  Service: Interventional Radiology;  Laterality: Right;      Family History   Problem Relation Age of Onset    Breast cancer Mother     Heart attack Mother 69    Hypertension Mother     Diabetes Mother     Coronary artery disease Mother     Diabetes Brother     Heart attack Father     Hypertension Father        Social History   Substance Use:    reports that he has never smoked. He has never used smokeless tobacco.    reports that he does not currently use alcohol.    reports no history of drug use.     Marital Status: married    Home/Family: Resides with his wife, Lajoyce Corners, of 3 years in [place], [place].      Occupation/Hobbies: Retired Hydrographic surveyor.    Cultural Concerns:    Translator needed: [x]  NO   []  YES                                       Language: English     Spirituality and Importance: Baptist      Medications   Scheduled Meds  Current Facility-Administered Medications   Medication Dose Route Frequency    amiodarone  400 mg Oral BID    Followed by    Melene Muller ON 07/10/2023] amiodarone  400 mg Oral Daily    atorvastatin  80 mg Oral Daily    bumetanide  2 mg Intravenous BID    hydrALAZINE  75 mg Oral Q8H SCH    insulin glargine  15 Units Subcutaneous QHS    insulin glargine  15 Units Subcutaneous QAM    insulin lispro  1-6 Units Subcutaneous QHS     insulin lispro  2-10 Units Subcutaneous TID AC    isosorbide dinitrate  10 mg Oral TID - Nitrate Free Interval    pantoprazole  40 mg Oral QAM AC    polyethylene glycol  17 g Oral Once    senna-docusate  1 tablet Oral BID      DRIPS  DOBUTamine 2.5 mcg/kg/min (07/05/23 1200)      PRN MEDS  PRN Medications[2]    Allergies   Allergies[3]    Physical Exam   BP 103/68   Pulse 76   Temp 97 F (36.1 C) (Temporal)   Resp 17   Ht 1.829 m (6')   Wt 90.4 kg (199 lb 4.7 oz)   SpO2 98%   BMI 27.03 kg/m      Physical Exam:  Physical Exam  Vitals and nursing note reviewed. Exam conducted with a chaperone present (wife).   Constitutional:       General: He is not in acute distress.     Appearance: Normal appearance.   HENT:      Head: Normocephalic and atraumatic.      Mouth/Throat:      Mouth: Mucous membranes are moist.   Eyes:      Pupils: Pupils are equal, round, and reactive to light.   Cardiovascular:      Rate and Rhythm: Normal rate. Rhythm irregular.      Comments: Frequent ectopy noted.  Pulmonary:      Effort: No respiratory distress.   Abdominal:      General: There is no distension.   Musculoskeletal:         General: Normal range of motion.      Cervical back: Normal range of motion.      Comments: Generalized weakness.   Skin:     General: Skin is warm and dry.   Neurological:      Mental Status: He is alert and oriented to person, place, and time.   Psychiatric:         Mood and Affect: Mood normal.         Behavior: Behavior normal.         Thought Content: Thought content normal.         Judgment: Judgment normal.        Labs / Radiology   Lab and diagnostics: reviewed in Epic  Recent Labs   Lab 07/05/23  1036 07/05/23  0500   WBC  --  7.47   Hemoglobin 9.8* 9.6*   Hematocrit 30.9* 29.9*   Platelet Count  --  67*       Recent Labs   Lab 06/29/23  1914   PTT 27        Recent Labs   Lab 07/05/23  1036   Sodium 134*   Potassium 4.3   Chloride 102   CO2 18   BUN 71*   Creatinine 4.0*   GFR 14.8*   Glucose  199*   Calcium 8.7     Recent Labs   Lab 07/03/23  1232   Bilirubin, Total 1.3*   Bilirubin Direct 0.8*   Protein, Total 6.4   Albumin 3.3*   ALT 35   AST (SGOT) 22          XR Chest AP Portable    Result Date: 07/05/2023  Tip of the pulmonary arterial catheter is likely in the right main pulmonary artery. Shelly Flatten, MD 07/05/2023 7:58 AM    XR Chest AP Portable    Result Date: 07/04/2023  1.Improving pulmonary vascular congestion, suggestive of improving edema. Legrand Pitts, MD 07/04/2023 2:54 PM    XR Chest AP Portable    Result Date: 07/03/2023  Stable lines and tubes. Stable mild interstitial edema. Pankaj Dominica, MD 07/03/2023 8:03 PM    XR Chest AP Portable  Result Date: 07/03/2023  1.Stable position of right jugular Swan-Ganz catheter tip. 2.Stable mild pulmonary vascular congestion. Legrand Pitts, MD 07/03/2023 3:43 PM    XR Chest AP Portable    Result Date: 07/02/2023   PA catheter tip in good position. Mild peribronchial wall thickening centrally may indicate interstitial inflammation infection or congestion. No overt pulmonary edema. Charlott Rakes, MD 07/02/2023 11:48 AM    X-ray chest AP portable    Result Date: 07/01/2023  Right IJ Swan-Ganz catheter tip overlies the right pulmonary artery. Nonda Lou, MD 07/01/2023 7:47 AM    XR Chest AP Portable    Result Date: 06/30/2023  1. Pulmonary artery catheter tip at the proximal right pulmonary artery. 2. CHF. Nelta Numbers, MD 06/30/2023 6:43 PM    Chest AP Portable    Result Date: 06/29/2023   No acute findings. 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    Coronary  artery disease 2011    CORONARY STENT PLACEMENT FOLLOWED BY [facility]    echocardiogram 02/2016, 11/2016, 09/2017, 12/20/2017    GERD (gastroesophageal reflux disease)     Heart attack 2011    and 2014    Hyperlipemia     Hypertension     116/80    ICD (implantable 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     PLACED 2014  [facility]  LAST INTERROGATION April 01 2018 REPORT REQUESTED    Ischemic cardiomyopathy     EF 15% ON ECHO 09-20-2017 DR GARG IN EPIC    Nonischemic cardiomyopathy     NSTEMI (non-ST elevated myocardial infarction) 2012    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    Pacemaker 2014    MEDTR[employer]IC ICD/PACEMAKER COMBO LAST INTERROGATION  12-12 2018    Pneumonia 09/2016    Primary cardiomyopathy     Ischemic cardiomyopathy EF 15% ON ECHO 09-20-2017 DR GARG IN EPIC    Renal insufficiency     Sepsis 08/2017    Syncope and collapse     PRIOR TO ICD/PACEMAKER PLACED 2014    Type 2 diabetes mellitus, controlled DX 1998    A1c ~6.0 (02/2023) FBS~110    [place]eze     PT SEE HIS PMD April 01 2018 RE THIS-TOLD TO SEE PMD BY [facility] JUNE 12 WHEN THEY SAW HIM FOR A CL   [2]   Current Facility-Administered Medications   Medication Dose    acetaminophen  650 mg    alum & mag hydroxide-simethicone  30 mL    benzonatate  200 mg    guaiFENesin-dextromethorphan  10 mL    melatonin  3 mg    naloxone  0.2 mg    ondansetron  4 mg    [START ON 07/06/2023] polyethylene glycol  17 g    traZODone  50 mg   [3]   Allergies  Allergen Reactions    Allopurinol Edema

## 2023-07-05 NOTE — Progress Note - Problem Oriented Charting Notewrit (Deleted)
PROGRESS NOTE      Date Time: 07/05/23 10:15 AM  Patient Name: [name] [name], [name] [name]    Chief Complaint:   ARF. CKD 3b.    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  Appeared to be in decompensated CHF and started on dobutamine drips, nitroprusside drips.  Responded well.  Will follow the patient in the office therefore we have been asked to follow the patient in the hospital.    His SOB has improved. He not requiring supplemental oxygen , he is afebrile     Medications:   Scheduled Meds:  Current Facility-Administered Medications   Medication Dose Route Frequency    amiodarone  400 mg Oral BID    Followed by    Melene Muller ON 07/10/2023] amiodarone  400 mg Oral Daily    atorvastatin  80 mg Oral Daily    bumetanide  2 mg Intravenous BID    hydrALAZINE  75 mg Oral Q8H SCH    insulin glargine  15 Units Subcutaneous QHS    insulin glargine  15 Units Subcutaneous QAM    insulin lispro  1-6 Units Subcutaneous QHS    insulin lispro  2-10 Units Subcutaneous TID AC    isosorbide dinitrate  10 mg Oral TID - Nitrate Free Interval    pantoprazole  40 mg Oral QAM AC     Continuous Infusions:   DOBUTamine 2.5 mcg/kg/min (07/04/23 1335)     PRN Meds:.acetaminophen, alum & mag hydroxide-simethicone, benzonatate, guaiFENesin-dextromethorphan, melatonin, naloxone, ondansetron **OR** [DISCONTINUED] ondansetron, senna-docusate, traZODone    Review of Systems:   Review of systems was: Negative except -shortness of breath.    Physical Exam:   Blood pressure 96/74, pulse 72, temperature 97.2 F (36.2 C), temperature source Temporal, resp. rate 13, height 1.829 m (6' 0.01"), weight 90.4 kg (199 lb 4.7 oz), SpO2 99%.     Intake and Output Summary (Last 24 hours) at Date Time:   Intake/Output Summary (Last 24 hours) at 07/05/2023 1015  Last data filed at 07/05/2023  1008  Gross per 24 hour   Intake 1671.53 ml   Output 1000 ml   Net 671.53 ml     Intake and Output Summary for yesterday: 09/15 0701 - 09/16 0700  In: 1664.57 [P.O.:1220; I.V.:444.57]  Out: 2000 [Urine:2000]     Mental status - alert, oriented to person, place, and time  Eyes - pupils equal and reactive, extraocular eye movements intact  Ears -  external ear canals normal  Nose - normal and patent, no erythema, discharge or polyps  Mouth - mucous membranes moist, pharynx normal without lesions  Neck - supple, no significant adenopathy, no JVD  Lymphatics - no palpable lymphadenopathy, no hepatosplenomegaly  Chest - diminished to auscultation, no wheezes, rales or rhonchi, symmetric air entry  Heart - normal rate, regular rhythm, normal S1, S2, no murmurs, rubs, clicks or gallops  Abdomen - soft, nontender, nondistended, no masses or organomegaly, bowel sounds present. NO bruits over the renal arteries.  Neurological - awake, alert, oriented x3, normal speech, no focal findings or movement disorder noted  Musculoskeletal - no joint tenderness, deformity or swelling  Extremities - peripheral pulses normal, no pedal edema, no clubbing or cyanosis.  Skin - normal coloration and turgor, no rashes, no suspicious skin lesions noted    Labs:     Results  Procedure Component Value Units Date/Time    Whole Blood Glucose POCT [[id number]]  (Abnormal) Collected: 07/05/23 0754    Specimen: Blood, Capillary Updated: 07/05/23 0805     Whole Blood Glucose POCT 121 mg/dL     Basic Metabolic Panel [[id number]]  (Abnormal) Collected: 07/05/23 0546    Specimen: Blood, Venous Updated: 07/05/23 0649     Glucose 135 mg/dL      BUN 67 mg/dL      Creatinine 3.8 mg/dL      Calcium 8.7 mg/dL      Sodium 782 mEq/L      Potassium 3.7 mEq/L      Chloride 103 mEq/L      CO2 20 mEq/L      Anion Gap 13.0     GFR 15.7 mL/min/1.73 m2     Magnesium [[id number]]  (Normal) Collected: 07/05/23 0546    Specimen: Blood, Venous Updated: 07/05/23 0649      Magnesium 2.6 mg/dL     Co-Oximetry Profile [[id number]]  (Abnormal) Collected: 07/05/23 0557    Specimen: Blood, Venous Updated: 07/05/23 0557     Hematocrit Total Calculated 31.1 %      Hemoglobin Total 10.1 g/dL      Carboxyhemoglobin 1.2 %      Oxygenated Hemoglobin 42.7 %      Methemoglobin 0.7 %      Collection Site Not Specif    Lactic Acid [[id number]]  (Normal) Collected: 07/05/23 0546    Specimen: Blood, Venous Updated: 07/05/23 0552     Whole Blood Lactic Acid 0.7 mmol/L     CBC without Differential [[id number]]  (Abnormal) Collected: 07/05/23 0500    Specimen: Blood, Arterial Updated: 07/05/23 0523     WBC 7.47 x10 3/uL      Hemoglobin 9.6 g/dL      Hematocrit 64.4 %      Platelet Count 67 x10 3/uL      MPV --     RBC 3.48 x10 6/uL      MCV 85.9 fL      MCH 27.6 pg      MCHC 32.1 g/dL      RDW 18 %      nRBC % 0.4 /100 WBC      Absolute nRBC 0.03 x10 3/uL     Whole Blood Glucose POCT [[id number]]  (Abnormal) Collected: 07/04/23 1613    Specimen: Blood, Capillary Updated: 07/05/23 0032     Whole Blood Glucose POCT 258 mg/dL     Basic Metabolic Panel [[id number]]  (Abnormal) Collected: 07/04/23 2340    Specimen: Blood, Venous Updated: 07/05/23 0005     Glucose 230 mg/dL      BUN 66 mg/dL      Creatinine 3.8 mg/dL      Calcium 8.7 mg/dL      Sodium 295 mEq/L      Potassium 4.1 mEq/L      Chloride 103 mEq/L      CO2 19 mEq/L      Anion Gap 12.0     GFR 15.7 mL/min/1.73 m2     Magnesium [[id number]]  (Normal) Collected: 07/04/23 2340    Specimen: Blood, Venous Updated: 07/05/23 0005     Magnesium 2.5 mg/dL     Co-Oximetry Profile [[id number]]  (Abnormal) Collected: 07/04/23 2356    Specimen: Blood, Venous Updated: 07/04/23 2356     Hematocrit Total Calculated 31.5 %      Hemoglobin Total 10.3 g/dL      Carboxyhemoglobin 1.1 %  Oxygenated Hemoglobin 42.9 %      Methemoglobin 0.6 %      Collection Site Not Specif    Lactic Acid [[id number]]  (Normal) Collected: 07/04/23 2340    Specimen: Blood, Venous Updated:  07/04/23 2356     Whole Blood Lactic Acid 1.2 mmol/L     Hemoglobin and Hematocrit [[id number]]  (Abnormal) Collected: 07/04/23 2340    Specimen: Blood, Venous Updated: 07/04/23 2347     Hemoglobin 9.8 g/dL      Hematocrit 78.2 %     Whole Blood Glucose POCT [[id number]]  (Abnormal) Collected: 07/04/23 2204    Specimen: Blood, Capillary Updated: 07/04/23 2207     Whole Blood Glucose POCT 217 mg/dL     Magnesium [[id number]]  (Normal) Collected: 07/04/23 1809    Specimen: Blood, Venous Updated: 07/04/23 1840     Magnesium 2.5 mg/dL     Basic Metabolic Panel [[id number]]  (Abnormal) Collected: 07/04/23 1809    Specimen: Blood, Venous Updated: 07/04/23 1840     Glucose 293 mg/dL      BUN 64 mg/dL      Creatinine 3.7 mg/dL      Calcium 8.7 mg/dL      Sodium 010 mEq/L      Potassium 4.2 mEq/L      Chloride 101 mEq/L      CO2 21 mEq/L      Anion Gap 12.0     GFR 16.2 mL/min/1.73 m2     Lactic Acid [[id number]]  (Normal) Collected: 07/04/23 1809    Specimen: Blood, Venous Updated: 07/04/23 1820     Whole Blood Lactic Acid 1.4 mmol/L     Hemoglobin and Hematocrit [[id number]]  (Abnormal) Collected: 07/04/23 1809    Specimen: Blood, Venous Updated: 07/04/23 1819     Hemoglobin 9.9 g/dL      Hematocrit 63.8 %     Co-Oximetry Profile [[id number]]  (Abnormal) Collected: 07/04/23 1808    Specimen: Blood, Venous Updated: 07/04/23 1809     Hematocrit Total Calculated 31.7 %      Hemoglobin Total 10.3 g/dL      Carboxyhemoglobin 1.2 %      Oxygenated Hemoglobin 44.8 %      Methemoglobin 0.7 %      Collection Site Not Specif    Basic Metabolic Panel [[id number]]  (Abnormal) Collected: 07/04/23 1215    Specimen: Blood, Venous Updated: 07/04/23 1244     Glucose 294 mg/dL      BUN 62 mg/dL      Creatinine 3.7 mg/dL      Calcium 8.7 mg/dL      Sodium 301 mEq/L      Potassium 4.5 mEq/L      Chloride 102 mEq/L      CO2 21 mEq/L      Anion Gap 12.0     GFR 16.2 mL/min/1.73 m2     Magnesium [[id number]]  (Normal) Collected: 07/04/23 1215     Specimen: Blood, Venous Updated: 07/04/23 1244     Magnesium 2.5 mg/dL     Hemoglobin and Hematocrit [[id number]]  (Abnormal) Collected: 07/04/23 1215    Specimen: Blood, Venous Updated: 07/04/23 1229     Hemoglobin 9.8 g/dL      Hematocrit 27.0 %     Lactic Acid [[id number]]  (Normal) Collected: 07/04/23 1215    Specimen: Blood, Venous Updated: 07/04/23 1229     Whole Blood Lactic Acid 1.2 mmol/L     Co-Oximetry Profile [[id number]]  (Abnormal) Collected: 07/04/23 1207  Specimen: Blood, Venous Updated: 07/04/23 1208     Hematocrit Total Calculated 31.2 %      Hemoglobin Total 10.2 g/dL      Carboxyhemoglobin 1.1 %      Oxygenated Hemoglobin 45.5 %      Methemoglobin 0.7 %      Collection Site Not Specif    Whole Blood Glucose POCT [[id number]]  (Abnormal) Collected: 07/04/23 1153    Specimen: Blood, Capillary Updated: 07/04/23 1157     Whole Blood Glucose POCT 260 mg/dL             Labs (last 72 hours):  Recent Labs     07/05/23  0500 07/04/23  2340 07/04/23  1809 07/04/23  0558 07/04/23  0303 07/03/23  1232 07/03/23  0549   WBC 7.47  --   --   --  7.19  --  6.82   Hemoglobin 9.6* 9.8* 9.9*   < > 10.1*   < > 9.8*   Hematocrit 29.9* 30.1* 30.5*   < > 31.0*   < > 30.5*    < > = values in this interval not displayed.     No results for input(s): "PT", "INR", "PTT" in the last 72 hours.   Recent Labs     07/05/23  0546 07/04/23  2340 07/04/23  1809 07/04/23  1215 07/04/23  0558   Sodium 136 134* 134*   < > 137   Potassium 3.7 4.1 4.2   < > 4.0   Chloride 103 103 101   < > 105   CO2 20 19 21    < > 20   BUN 67* 66* 64*   < > 56*   Creatinine 3.8* 3.8* 3.7*   < > 3.5*   Glucose 135* 230* 293*   < > 216*   Calcium 8.7 8.7 8.7   < > 9.0   Magnesium 2.6 2.5 2.5   < >  --    Phosphorus  --   --   --   --  4.4    < > = values in this interval not displayed.                   Rads:     Radiology Results (24 Hour)       Procedure Component Value Units Date/Time    XR Chest AP Portable [[id number]] Collected: 07/05/23 0756    Order  Status: Completed Updated: 07/05/23 0800    Narrative:      HISTORY: Reevaluate Swan-Ganz catheter     COMPARISON: 07/04/2023    FINDINGS:   Right IJ pulmonary arterial catheter with tip likely in the right main  pulmonary artery. Left-sided pacer device is present. No pulmonary edema.  No focal consolidation, pleural effusion or pneumothorax.      Impression:        Tip of the pulmonary arterial catheter is likely in the right main  pulmonary artery.    Shelly Flatten, MD  07/05/2023 7:58 AM    XR Chest AP Portable [[id number]] Collected: 07/04/23 1452    Order Status: Completed Updated: 07/04/23 1457    Narrative:      HISTORY: Swan-Ganz catheter placement, abnormal chest radiograph     COMPARISON: 07/03/2023    FINDINGS:   LINES AND TUBES: Stable position of right jugular Swan-Ganz catheter tip.  Stable position of left pacemaker with stable leads.    CARDIAC SILHOUETTE: Stable in size.    LUNGS/PLEURA: Improving pulmonary vascular  congestion. No consolidation,  pleural effusion or pneumothorax.      Impression:        1.Improving pulmonary vascular congestion, suggestive of improving edema.    Legrand Pitts, MD  07/04/2023 2:54 PM              Assessment:   ARF - due to cardiorenal syndrome.  In spite of significant diuresis .  The kidney function remains stable having current diuresis .  Continue Bumex 2 mg IV BID   CKD 3B - the baseline creatinine level is 1.7 in April of this year.  Not sure if the kidney function can return back to the baseline. 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 Resolved. Continue current diuresis.  Diabetes type 2 - on sliding scale insulin.  Cardiogenic shock : resolving   Plan:   Bumex 2 mg bid   Amiodarone tab\  Nitroprusside drips  Remains on dobutamine gtt   CBC, BMP daily.  Cr 3.3 stable     Time spent for evaluation, management and coordination of care:  40 minutes      Signed by: Laney Pastor, MD, MD  [phone], office  [phone], pager

## 2023-07-05 NOTE — Progress Notes (Signed)
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 not a candidate for advanced heart failure interventions nor valvular intervention  - Goals of care done with the patient with cardiology as patient transitions to hospice  - Dobutamine drip weaned off  - Optimize goal-directed medical therapy with diuretics and afterload reduction with hydralazine and isosorbide dinitrate    #Resp: Dyspnea related to congestive heart failure (improved)  - Saturating well on room air    #GI:  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  - Nephrology following  - Optimize diuresis  - Optimize electrolytes    #Infectious Disease (ID): No indication of infection at this time, will closely monitor.  Afebrile, no leukocytosis           #Hem/Onc: Anemia of chronic disease  Remote history of CLL    #Endocrine: Diabetes mellitus, continue Lantus and high-dose sliding scale insulin     DVT prophylaxis SCDs  GI prophylaxis on PPI  DNR/DNI allow natural death    Plan of care was discussed in extensive detail with the patient initially and then patient and wife at bedside.  Their questions answered to his satisfaction.  Patient medically optimized for transfer to medical floors for continuation of care.      Subjective:     Patient seen and examined at bedside on  multiple occasions.  This morning patient was overwhelmed about having to be in the hospital on multiple occasions.  He felt well breathing well on room air.  He expressed frustration about his heart and the hospitalizations requiring his medications.  He has shortness of breath that comes and goes even at home.  Otherwise tolerating oral intake and perfusing well.    Medications:   Scheduled Meds:  Current Facility-Administered Medications   Medication Dose Route Frequency    amiodarone  400 mg Oral BID    Followed by    Melene Muller ON 07/10/2023] amiodarone  400 mg Oral Daily    atorvastatin  80 mg Oral Daily    bumetanide  2 mg Intravenous BID    hydrALAZINE  75 mg Oral Q8H SCH    hydrocortisone   Rectal BID    insulin glargine  15 Units Subcutaneous QHS    insulin glargine  15 Units Subcutaneous QAM    insulin lispro  1-6 Units Subcutaneous QHS    insulin lispro  2-10 Units Subcutaneous TID AC    isosorbide dinitrate  10 mg Oral TID - Nitrate Free Interval    LORazepam  1 mg Oral QHS    pantoprazole  40 mg Oral QAM AC    senna-docusate  1 tablet Oral BID         Continuous Infusions:  DOBUTamine Stopped (07/05/23 1500)        Physical Exam:     Vitals:    07/05/23 1430   BP: 117/65   Pulse:    Resp:    Temp:    SpO2:          Intake/Output Summary (Last 24 hours) at 07/05/2023 1737  Last data filed at 07/05/2023 1700  Gross per 24 hour   Intake 1605.11 ml   Output 1200 ml   Net 405.11 ml       General Appearance: Resting comfortably in bed, alert and oriented  CV: Normal rate and rhythm with systolic murmur  Pulm: Faint basilar crackles but good air entry, no tachypnea  Abd: Soft, nontender, nondistended, positive bowel sounds  Extremities: Mild lower extreme edema    Critical care provider statement:   Critical care time (minutes): 50   Critical care was necessary to treat or prevent imminent or life-threatening deterioration of the following conditions: 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

## 2023-07-05 NOTE — UM Notes (Addendum)
CSR, 9/14:    PATIENT NAME: [name] [name], [name] [name]   DOB: 05/04/1946     Reason for Continued Stay:  77 Year old male admitted with Acute on chronic cardiogenic shock    Vital Signs:      07/03/23 1030   BP:     Pulse: 78   Resp: 19   Temp:     SpO2: 97%           Invasive Hemodynamic Monitoring  Hemodynamic Monitoring Additional Assessment: Yes  CVP (mmHg): 21 mmHg  PAP: 45/19  PAP (Mean): 31 mmHg  Cardiac Power Output: 0.68  Pulmonary Artery Pulsatility Index (PAPI): 1.24     Arterial Line  Arterial Line BP: 108/68  Arterial Line MAP (mmHg): 82 mmHg  Arterial Line Location: Left radial  Arterial Line Wave Form: Appropriate wave forms, Whip, Rezeroed        Intake/Output Summary (Last 24 hours) at 07/03/2023 1138  Last data filed at 07/03/2023 0854      Gross per 24 hour   Intake 2342.2 ml   Output 1100 ml   Net 1242.2 ml     Labs:   Latest Reference Range & Units 07/03/23 00:05   Hemoglobin Total 13.0 - 17.0 g/dL 9.9 (L)   Hematocrit Total Calculated 40.0 - 54.0 % 30.4 (L)   ABG CollectionSite  Not Specif   Carboxyhemoglobin 0.4 - 2.7 % 1.2   Methemoglobin 0.0 - 1.5 % 0.7   Oxygenated Hemoglobin 94.0 - 98.0 % 52.4 (L)   (L): Data is abnormally low     Latest Reference Range & Units 07/03/23 05:49   WBC 3.10 - 9.50 x10 3/uL 6.82   Hemoglobin 12.5 - 17.1 g/dL 9.8 (L)   Hematocrit 16.1 - 49.6 % 30.5 (L)   Platelet Count 142 - 346 x10 3/uL 70 (L)   RBC 4.20 - 5.90 x10 6/uL 3.50 (L)   MCV 78.0 - 96.0 fL 87.1   MCH 25.1 - 33.5 pg 28.0   MCHC 31.5 - 35.8 g/dL 09.6   RDW 11 - 15 % 19 (H)   MPV  See Comment   Nucleated RBC <=0.0 /100 WBC 0.9 (H)   Nucleated RBC Absolute <=0.00 x10 3/uL 0.06 (H)   Glucose 70 - 100 mg/dL 045 (H)   BUN 9 - 28 mg/dL 48 (H)   Creatinine 0.5 - 1.5 mg/dL 3.2 (H)   Sodium 409 - 145 mEq/L 140   Potassium 3.5 - 5.3 mEq/L 3.9   Chloride 99 - 111 mEq/L 107   CO2 17 - 29 mEq/L 21   Calcium 7.9 - 10.2 mg/dL 8.7   Anion Gap 5.0 - 81.1  12.0   EGFR >=60.0 mL/min/1.73 m2 19.3 (L)   Magnesium 1.6 - 2.6 mg/dL 2.5    Whole Blood Lactate 0.2 - 2.0 mmol/L 1.2   (L): Data is abnormally low  (H): Data is abnormally high     Latest Reference Range & Units 07/03/23 05:55   Hemoglobin Total 13.0 - 17.0 g/dL 91.4 (L)   Hematocrit Total Calculated 40.0 - 54.0 % 31.1 (L)   ABG CollectionSite  Not Specif   Carboxyhemoglobin 0.4 - 2.7 % 1.1   Methemoglobin 0.0 - 1.5 % 0.7   Oxygenated Hemoglobin 94.0 - 98.0 % 46.0 (L)   (L): Data is abnormally low     Latest Reference Range & Units 07/03/23 12:26   Hemoglobin Total 13.0 - 17.0 g/dL 78.2 (L)   Hematocrit Total Calculated 40.0 - 54.0 %  31.5 (L)   ABG CollectionSite  Mixed   Carboxyhemoglobin 0.4 - 2.7 % 1.2   Methemoglobin 0.0 - 1.5 % 0.7   Oxygenated Hemoglobin 94.0 - 98.0 % 43.4 (L)   (L): Data is abnormally low     Latest Reference Range & Units 07/03/23 12:32   Hemoglobin 12.5 - 17.1 g/dL 9.7 (L)   Hematocrit 13.0 - 49.6 % 30.5 (L)   Glucose 70 - 100 mg/dL 865 (H)   BUN 9 - 28 mg/dL 48 (H)   Creatinine 0.5 - 1.5 mg/dL 3.2 (H)   Sodium 784 - 145 mEq/L 136   Potassium 3.5 - 5.3 mEq/L 4.3   Chloride 99 - 111 mEq/L 105   CO2 17 - 29 mEq/L 20   Calcium 7.9 - 10.2 mg/dL 8.4   Anion Gap 5.0 - 69.6  11.0   EGFR >=60.0 mL/min/1.73 m2 19.3 (L)   Magnesium 1.6 - 2.6 mg/dL 2.4   Whole Blood Lactate 0.2 - 2.0 mmol/L 1.3   AST 5 - 41 U/L 22   ALT 0 - 55 U/L 35   Alkaline Phosphatase 37 - 117 U/L 133 (H)   Albumin 3.5 - 5.0 g/dL 3.3 (L)   Protein Total 6.0 - 8.3 g/dL 6.4   Globulin 2.0 - 3.6 g/dL 3.1   Albumin/Globulin Ratio 0.9 - 2.2  1.1   Bilirubin Total 0.2 - 1.2 mg/dL 1.3 (H)   Bilirubin Direct 0.0 - 0.5 mg/dL 0.8 (H)   Bilirubin Indirect 0.2 - 1.0 mg/dL 0.5   (L): Data is abnormally low  (H): Data is abnormally high     Latest Reference Range & Units 07/03/23 18:24   Hemoglobin Total 13.0 - 17.0 g/dL 29.5 (L)   Hematocrit Total Calculated 40.0 - 54.0 % 31.6 (L)   ABG CollectionSite  Not Specif   Carboxyhemoglobin 0.4 - 2.7 % 1.1   Methemoglobin 0.0 - 1.5 % 0.6   Oxygenated Hemoglobin 94.0 - 98.0  % 40.3 (L)   (L): Data is abnormally low     Latest Reference Range & Units 07/03/23 18:26   Hemoglobin 12.5 - 17.1 g/dL 28.4 (L)   Hematocrit 13.2 - 49.6 % 32.0 (L)   Glucose 70 - 100 mg/dL 440 (H)   BUN 9 - 28 mg/dL 51 (H)   Creatinine 0.5 - 1.5 mg/dL 3.4 (H)   Sodium 102 - 145 mEq/L 135   Potassium 3.5 - 5.3 mEq/L 4.2   Chloride 99 - 111 mEq/L 105   CO2 17 - 29 mEq/L 19   Calcium 7.9 - 10.2 mg/dL 8.4   Anion Gap 5.0 - 72.5  11.0   EGFR >=60.0 mL/min/1.73 m2 18.0 (L)   Magnesium 1.6 - 2.6 mg/dL 2.4   Whole Blood Lactate 0.2 - 2.0 mmol/L 1.2   (L): Data is abnormally low  (H): Data is abnormally high    Consults:  NEPHROLOGY-  Plan:  Bumex drip at 1 mg/h  Amiodarone drips  Dobutamine drips  Nitroprusside drips  CBC, BMP daily.  Cr 3.3 stable     CARDIOLOGY-  RECOMMENDATIONS:     ? Add metolazone 2.5 mg x 1   ? Hydralazine was increased today to 75 mg q8hr  ? if PAD increasing much more than 15-17, add metolazone if stable then likely euvolemic and therefore would run net even today as renal function is deteriorated potentially over diuresed at this juncture    Plan of Care:  #Neuro: Alert, interactive, no signs of confusion     #  Palliative care: Palliative care consulted for advanced care planning.      #Cardio: Acute on chronic cardiogenic shock  Appreciate advanced heart failure service Dr. Webb Silversmith as well as [facility] general cardiologist evaluation recommendation. Cont dobutamine drip to 5 mcg/kg/min.  Not a candidate for advanced heart failure therapy with LVAD given comorbidities.  Ventricular ectopy, tolerating lower dose of amiodarone infusion@0 .5  off nipride infusion. Now on hydralazine for afterload reduction.     Coronary artery disease  History of hypertension, currently not tolerating goal-directed medical therapy in the setting of acute decompensated heart failure.     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  Improved with diuresis.    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  Appreciate [facility] evaluation recommendation.  Resolved Lactic acidosis, improved with dobutamine infusion  Continue Bumex infusion to decrease CVP less than 10     #Infectious Disease (ID): No indication of infection at this time, will closely monitor.  Afebrile, no leukocytosis           #Hem/Onc: Anemia of chronic disease  Remote history of CLL     #Endocrine: Diabetes mellitus, continue Lantus and high-dose sliding scale insulin     #Prophylaxis:   VTE Prophylaxis:hold for low platelet        #Code Status: full code     Medications:           Current Facility-Administered Medications   Medication Dose Route Frequency    atorvastatin  80 mg Oral Daily    hydrALAZINE  50 mg Oral Q8H SCH    insulin glargine  15 Units Subcutaneous QHS    insulin glargine  15 Units Subcutaneous QAM    insulin lispro  1-6 Units Subcutaneous QHS    insulin lispro  2-10 Units Subcutaneous TID AC    pantoprazole  40 mg Oral QAM AC            Continuous Infusions:   amiodarone 0.5 mg/min (07/03/23 0700)    bumetanide 1 mg/hr (07/03/23 0700)    DOBUTamine 5 mcg/kg/min (07/03/23 0700)    nitroprusside Stopped (07/03/23 0051)        PRN:    UTILIZATION REVIEW CONTACT: Gae Gallop RN, BSN  Utilization Review   [facility]  [address]  [address], [address]  [place], [place] [address]  Phone: [phone] (Voice Mail Only)  Email:  [email or web address]   NPI:   [id number]  Tax ID:  [id number]         NOTES TO REVIEWER:    This clinical review is based on/compiled from documentation provided by the treatment team within the patient's medical record.

## 2023-07-05 NOTE — Consults (Addendum)
Advanced Heart Failure Consult Note  [facility]      ASSESSMENT:      [name] is a 77 year old male with end-stage non-ischemic cardiomyopathy admitted to the intensive care unit for management of cardiogenic shock.      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 Primary oncologist: Dr. Laurine Blazer.   Right lower extremity DVT (May 2023)  Type 2 diabetes, A1c 7.7%  Dyslipidemia, LDL 68     RECOMMENDATIONS:      Cardiogenic shock, SCAI stage C    Preload:   Diurese for CVP < 10 and PAD < 18   Afterload:  Target MAP 65-75 mmHg or SVR [phone] dynes  Inotropy:  Titrate dobutamine to target CI > 2.2   Beta blockade:  Hold in the setting of shock  MRA and SGLT2i:    Hold in the setting of advanced CKD    Advanced heart failure therapies and goals of care    Mr. Allsup it not a transplant candidate due to advanced age. He was previously evaluated and declined for a DT LVAD due to concomitant renal disease.   Mr. Tomlins is not a candidate for temporary mechanical circulatory support as it would be a bridge to nowhere.   I believe that Mr. Radon is a poor candidate for discharge home with palliative inotropes. Milrinone would be dangerous in the context of his impaired renal function. Even on dobutamine, Mr. Kozar' hemodynamics are marginal. I do not believe that discharging Mr. Heckle on dobutamine will improve his symptoms or hemodynamic profile.   Mr. Vegter has end-stage non-ischemic cardiomyopathy and his short-term mortality is high (> 50% in 3 months). I recommend the engagement of hospice and palliative care services to assist with advanced care planning and goals of care conversations.     We will continue to follow.      ---------------------------------------------------------------------------------------------------------    Consult Requested by: Dr. Celine Mans ([facility])    Chief Complaint/Reason for consultation:      Subjective:    - Remains in the ICU   - PA catheter still in place, hemodynamics marginal on dobutamine     Current medications:  amiodarone, 400 mg, Oral, BID   Followed by  Melene Muller ON 07/10/2023] amiodarone, 400 mg, Oral, Daily  atorvastatin, 80 mg, Oral, Daily  bumetanide, 2 mg, Intravenous, BID  hydrALAZINE, 75 mg, Oral, Q8H SCH  insulin glargine, 15 Units, Subcutaneous, QHS  insulin glargine, 15 Units, Subcutaneous, QAM  insulin lispro, 1-6 Units, Subcutaneous, QHS  insulin lispro, 2-10 Units, Subcutaneous, TID AC  isosorbide dinitrate, 10 mg, Oral, TID - Nitrate Free Interval  pantoprazole, 40 mg, Oral, QAM AC      Current infusions:   DOBUTamine 2.5 mcg/kg/min (07/05/23 1200)       Physical exam:     Vital signs in last 24 hours:   Temp:  [97 F (36.1 C)-98.2 F (36.8 C)] 97 F (36.1 C)  Heart Rate:  [69-81] 76  Resp Rate:  [13-37] 17  BP: (96-118)/(59-84) 103/68  Arterial Line BP: (91-126)/(44-76) 103/55  SpO2: 98 %O2 Device: None (Room air)  Height: 182.9 cm (6')  Weight: 90.4 kg (199 lb 4.7 oz); Weight change:   Body mass index is 27.03 kg/m..    I/O last  3 completed shifts:  In: 2475.57 [P.O.:1610; I.V.:865.57]  Out: 3050 [Urine:3050]    General. The patient is is well-developed, well-nourished, comfortable, and of normal body habitus.   Heart. Tachycardic and regular rhythm. S1 and S2 are normal. No murmurs, gallops, or rubs.   Lungs. Non-labored breathing. Lungs are clear to auscultation bilaterally.   Abdomen. Bowel sounds are present. The abdomen is soft and non-tender.   Extremities. Warm and well perfused. There is no edema.  Neuro. The patient is alert, fully oriented, and cooperative. There are no gross motor or sensory deficits.     Labs:  Lab Results   Component Value Date    WBC 7.47  07/05/2023    HGB 9.8 (L) 07/05/2023    PLT 67 (L) 07/05/2023    NA 134 (L) 07/05/2023    K 4.3 07/05/2023    CREAT 4.0 (H) 07/05/2023    EGFR 14.8 (L) 07/05/2023    MG 2.5 07/05/2023    AST 22 07/03/2023    ALB 3.3 (L) 07/03/2023    LDL 63 12/21/2022     Cardiac studies:    Echocardiogram (06/30/23): Dilated LV (LVIDd 6.8) with EF 13%. Severe MR.     Right heart catheterization (06/30/23): RA 19, PA 42/23 (33), PCWP 27, Fick 3.3//1.6, TD 1.9/0.9, SVR 2640     ----------------------    Vania Rea, MD  Advanced Heart Failure/Transplant Cardiology and Cardiac Critical Care  [facility]      This patient is critically ill with life-threatening condition(s) and a high probability of sudden clinically significant deterioration due to the condition(s) noted in the assessment and plan, which requires the highest level of physician/advance practice provider preparedness to intervene urgently. Full attention to the direct care of this patient was provided for the period of time noted below. Any critical care time performed today is exclusive of teaching and billable procedures and not overlapping with any other physicians or advance practice providers.    I have personally assessed the patient and based my assessment and medical decision-making on a review of the patient's history and 24-hour interval events along with medical records, physical examination, vital signs, analysis of recent laboratory results, evaluation of radiology images, monitoring data for potential decompensation, and additional findings found in detail within ICU team notes. The findings and plan of care was discussed with the care team.    Total critical care time: 45 minutes during this encounter.    Greggory Brandy, MD   07/05/2023 12:51 PM

## 2023-07-05 NOTE — Nursing Progress Note (Signed)
NURSING PROGRESS NOTE  [facility] - INTENSIVE CARE [facility]   Patient: [name] [name], [name] [name], 77 y.o. male   Admitting dx: Chronic kidney disease, unspecified CKD stage [N18.9]  Acute on chronic congestive heart failure, unspecified heart failure type [I50.9]   Attending Physician: Allegra Grana, MD   Today's date:   07/05/2023 LOS: 5 days   Shift Summary:    Amiodarone drip switched to PO.  Palliative consult. Code status switched to no CPR - AND.  Dopamine drip Turned off.  PA cath, venous sheath, and arterial line removed.   Implanted defibrillator to be turned off, order placed.  Hospice and CM to arrange discharge.      Mobility:   PMP Activity: Step 3 - Bed Mobility (07/05/2023  4:00 PM)   Falls Risk - Risk Category: High     Vitals: I/O Last 24 hrs   Patient Vitals for the past 4 hrs:   BP Temp Pulse Resp   07/05/23 1900 -- -- 75 (!) 23   07/05/23 1800 106/81 97.1 F (36.2 C) 73 17   07/05/23 1700 -- -- 75 21   07/05/23 1600 97/65 -- 72 19     Temp (12hrs), Avg:97.1 F (36.2 C), Min:97 F (36.1 C), Max:97.3 F (36.3 C)    Oxygen Needs:  SpO2: 98 % (07/05/2023  7:00 PM)  O2 Device: None (Room air) (07/05/2023  8:00 AM)  O2 Flow Rate (L/min): 3 L/min (07/02/2023  4:00 AM)  Oximetry Probe Site Changed: No (07/05/2023  4:00 AM)        Intake/Output Summary (Last 24 hours) at 07/05/2023 1922  Last data filed at 07/05/2023 1700  Gross per 24 hour   Intake 1855.11 ml   Output 1200 ml   Net 655.11 ml      Last BM Date: 07/05/23     Last 3 Weights for the past 72 hrs (Last 3 readings):   Weight   07/05/23 0800 90.4 kg (199 lb 4.7 oz)   07/03/23 1114 85.7 kg (188 lb 15 oz)   07/03/23 0600 91 kg (200 lb 9.9 oz)      Foley/Drain LDA(s):    Name Placement date Placement time Site Days   Peripheral IV 06/30/23 20 G Anterior;Distal;Right Upper Arm 06/30/23  1030  Upper Arm  5   External Urinary Catheter 06/30/23  1847  --  5      Continuous Medications:   None

## 2023-07-05 NOTE — Progress Notes (Signed)
07/05/23 1020   VO2 Determination   Height 1.829 m (6')   Weight FICK (only) 90.4 kg (199 lb 4.7 oz)   BSA (Fick only) (Calculated - sq m) 2.14 sq meters   VO2 267.5   CO/CI Determination   SaO2  0.98   SvO2  0.46   SaO2 - SvO2 0.52   HGB Result 9.6   x 1.36 x 10 67.89   Cardiac Output (FICK) 3.94   Cardiac Index (FICK) 1.84   Cardiac Power Output 0.66   PVR Determination   PAP 51/20   PAP (Mean) 33 mmHg   Pulmonary Artery Pulsatility Index (PAPI) 1.41   SVR Determination   MAP (mmHg) 75   CVP (mmHg) 22 mmHg   MAP - CVP 53   SVR (dynes/second/cm3) 1076.14 dynes/second/cm3

## 2023-07-05 NOTE — Progress Notes (Deleted)
07/05/23 0000   VO2 Determination   Height 1.829 m (6' 0.01")   Weight FICK (only) 90.4 kg (199 lb 4.7 oz)   BSA (Fick only) (Calculated - sq m) 2.14 sq meters   VO2 267.5   CO/CI Determination   SaO2  0.99   SvO2  0.43   SaO2 - SvO2 0.56   HGB Result 10.1   x 1.36 x 10 76.92   Cardiac Output (FICK) 3.48   Cardiac Index (FICK) 1.63   Cardiac Power Output 0.59   PVR Determination   PAP 46/19   PAP (Mean) 31 mmHg   Pulmonary Artery Pulsatility Index (PAPI) 1.35   SVR Determination   MAP (mmHg) 91   CVP (mmHg) 20 mmHg   MAP - CVP 71   SVR (dynes/second/cm3) 1632.18 dynes/second/cm3       Dr. Lesle Reek notified

## 2023-07-05 NOTE — Progress Notes (Addendum)
07/05/23 0600   VO2 Determination   Height 1.829 m (6' 0.01")   Weight FICK (only) 90.4 kg (199 lb 4.7 oz)   BSA (Fick only) (Calculated - sq m) 2.14 sq meters   VO2 267.5   CO/CI Determination   SaO2  0.97   SvO2  0.43   SaO2 - SvO2 0.54   HGB Result 9.6   x 1.36 x 10 70.5   Cardiac Output (FICK) 3.79   Cardiac Index (FICK) 1.77   Cardiac Power Output 0.61   PVR Determination   PAP 48/14   PAP (Mean) 30 mmHg   Pulmonary Artery Pulsatility Index (PAPI) 2.62   SVR Determination   MAP (mmHg) 65   CVP (mmHg) 13 mmHg   MAP - CVP 52   SVR (dynes/second/cm3) 1097.63 dynes/second/cm3     Dr. Lesle Reek and Dr. Garnette Scheuermann notified.    5784: Bumex gtt d/c'd by Dr. Lesle Reek

## 2023-07-05 NOTE — Progress Notes (Signed)
Chaplain visited patient and wife at bedside at Palliative Team request. Patient expressed the desire to remain in good health/live longer. We explored faith and how that informs his time in the hospital, how faith offers strength. Patient and wife welcome going home with hospice and care that offers. Patient recited Lord's prayer and chaplain prayed for ease of mind and heart, forgiveness, and grace. Patient and spouse were grateful. I'll visit in the am. Page if family/patient request prior: [phone]

## 2023-07-05 NOTE — Progress Notes (Signed)
07/05/23 0000   VO2 Determination   Height 1.829 m (6' 0.01")   Weight FICK (only) 90.4 kg (199 lb 4.7 oz)   BSA (Fick only) (Calculated - sq m) 2.14 sq meters   VO2 267.5   CO/CI Determination   SaO2  0.99   SvO2  0.43   SaO2 - SvO2 0.56   HGB Result 10.1   x 1.36 x 10 76.92   Cardiac Output (FICK) 3.48   Cardiac Index (FICK) 1.63   Cardiac Power Output 0.59   PVR Determination   PAP 46/19   PAP (Mean) 31 mmHg   Pulmonary Artery Pulsatility Index (PAPI) 1.35   SVR Determination   MAP (mmHg) 76   CVP (mmHg) 20 mmHg   MAP - CVP 56   SVR (dynes/second/cm3) 1287.36 dynes/second/cm3

## 2023-07-06 ENCOUNTER — Inpatient Hospital Stay: Payer: Commercial Managed Care - POS

## 2023-07-06 LAB — CYANIDE LEVEL
Cyanide, Whole Blood: <0.1 mg/L (ref <0.1)
Cyanide, Whole Blood: <0.1 mg/L (ref <0.1)
Cyanide, Whole Blood: <0.1 mg/L (ref <0.1)

## 2023-07-06 LAB — WHOLE BLOOD GLUCOSE POCT
Whole Blood Glucose POCT: 79 mg/dL (ref 70–100)
Whole Blood Glucose POCT: 113 mg/dL — ABNORMAL HIGH (ref 70–100)
Whole Blood Glucose POCT: 99 mg/dL (ref 70–100)
Whole Blood Glucose POCT: 123 mg/dL — ABNORMAL HIGH (ref 70–100)

## 2023-07-06 NOTE — Progress Notes (Signed)
CM Progress note:    D/C Disposition: Home with [facility]   Anticipated D/C Date: Tomorrow    CM was able to facilitate a phone call with Harlow Asa [phone] ([phone] of [facility] and with patient and spouse.   Patient and spouse are agreeable to hospice and have been accepted by this agency.     Start of Care has been established for tomorrow with an RN arriving at their home between 6:30 and 7:00 PM.   FWW and oxygen have ordered by hospice to be delivered after patient arrives home tomorrow. Consents will be signed tomorrow evening during Start of Care with hospice RN.   Patient currently has no DME. He does have an adjustable bed and declines hospital bed at this time.     Spouse confirms that she will arrive to hospital tomorrow after work around 1:30 PM. She works locally in [place]. They moved to [place], [place] to secure a one story home after unable to find anything in this area. Spouse wants to transport patient to home in private vehicle.     ICU charge and bedside RN to ambulate patient this evening as he has been mostly in bed during hospital stay. CM can obtain FWW for patient tomorrow from [facility] if needed to assist in discharge to home.     CM will evaluate in the AM to see if medical transport may be needed to get patient to home.     Halea Lieb [facility] RN  [facility]  Case Manager  [phone]

## 2023-07-06 NOTE — UM Notes (Signed)
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 Noted plans for d/c to home with Hospice care  ? EPIC order placed by Lauretta Grill, NP for ICD therapies to be turned off  ? Pt changed AND, DNR/DNI  ? EP has reached out to [employer] to disable therapies    Plan of Care:  nd-stage cardiomyopathy, cardiogenic shock, treated with inotropic agent, IV diuretic infusion and vasodilator nitrate.   Overall no significant improvement and with worsening renal function.  Not a candidate for further treatment.  Referred for hospice per cardiology team.      Discharge planning with hospice.  Likely for tomorrow.         Impression and plan:   #Neuro: Alert and oriented     # Palliative care: Palliative was consulted patient currently DNR/DNI undergoing hospice evaluation and discharge planning for tomorrow.  DNR.     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  - Nephrology following  - Optimize diuresis  - Optimize electrolytes     #Infectious Disease (ID): No indication of infection at this time, will closely monitor.  Afebrile, no leukocytosis           #Hem/Onc: Anemia of chronic disease  Remote history of CLL     #Endocrine: Diabetes mellitus, continue Lantus and high-dose sliding scale insulin     DVT prophylaxis SCDs  GI prophylaxis on PPI  DNR/DNI allow natural death     Plan of care was discussed in extensive detail with the patient initially and then patient and wife at bedside.  Their questions answered to his satisfaction.  Patient medically optimized for transfer to medical floors for continuation of care.    Medications:  Scheduled Meds:  Current Facility-Administered Medications   Medication Dose Route Frequency    amiodarone  400 mg Oral BID  Followed by    Melene Muller ON 07/10/2023] amiodarone  400 mg Oral Daily    atorvastatin  80 mg Oral Daily    bumetanide  2 mg Intravenous BID    hydrALAZINE  75 mg Oral Q8H SCH    hydrocortisone   Rectal BID    insulin lispro  1-6 Units Subcutaneous QHS    insulin lispro  2-10 Units Subcutaneous TID AC    isosorbide dinitrate  10 mg Oral TID - Nitrate Free Interval    pantoprazole  40 mg Oral QAM AC    senna-docusate  1 tablet Oral BID     Continuous Infusions:  PRN Meds:.acetaminophen, alum & mag hydroxide-simethicone, benzonatate, guaiFENesin-dextromethorphan, melatonin, naloxone, ondansetron **OR** [DISCONTINUED] ondansetron, polyethylene glycol, traZODone      PRN:    UTILIZATION REVIEW CONTACT: Gae Gallop RN, BSN  Utilization Review   [facility]  [address]  [address]  [place], [place] [address]  Phone: [phone] (Voice Mail Only)  Email:  [email or web address]   NPI:   [id number]  Tax ID:  [id number]         NOTES TO REVIEWER:    This clinical review is based on/compiled from documentation provided by the treatment team within the patient's medical record.

## 2023-07-06 NOTE — Consults (Signed)
CM Progress note:    Consult received and Careport referral placed yesterday afternoon at 4:46 PM.    CM has spoken this AM with Phillips Grout with [facility] [phone]). Referral has been received and will be sent to hospice RN for evaluation. CM to receive a call back this AM.     CM will continue to follow to assist with discharge.     Kymari Nuon [facility] RN  [facility]  Case Manager  [phone]

## 2023-07-06 NOTE — Plan of Care (Signed)
Problem: Pain interferes with ability to perform ADL  Goal: Pain at adequate level as identified by patient  Outcome: Progressing  Flowsheets (Taken 07/06/2023 1044)  Pain at adequate level as identified by patient:   Identify patient comfort function goal   Assess for risk of opioid induced respiratory depression, including snoring/sleep apnea. Alert healthcare team of risk factors identified.   Assess pain on admission, during daily assessment and/or before any "as needed" intervention(s)   Reassess pain within 30-60 minutes of any procedure/intervention, per Pain Assessment, Intervention, Reassessment (AIR) Cycle   Evaluate if patient comfort function goal is met   Offer non-pharmacological pain management interventions   Consult/collaborate with Physical Therapy, Occupational Therapy, and/or Speech Therapy   Include patient/patient care companion in decisions related to pain management as needed   Evaluate patient's satisfaction with pain management progress     Problem: Day of Discharge - Heart Failure  Goal: Discharge Education  Outcome: Progressing  Flowsheets (Taken 07/06/2023 1044)  Day of Discharge:   Follow-up appointments   Fluid Restriction   Daily Standing Weights     Problem: Hemodynamic Status: Cardiac  Goal: Stable vital signs and fluid balance  Outcome: Progressing  Flowsheets (Taken 07/02/2023 0917 by Lawerance Cruel, RN)  Stable vital signs and fluid balance: Assess signs and symptoms associated with cardiac rhythm changes   Pt is stable on hospice Care. Will continue to monitor pt.

## 2023-07-06 NOTE — Plan of Care (Signed)
[facility] PLAN OF CARE NOTE        RECOMMENDATIONS:   Noted plans for d/c to home with Hospice care  EPIC order placed by Lauretta Grill, NP for ICD therapies to be turned off  Pt changed AND, DNR/DNI  EP has reached out to [employer] to disable therapies        Recommendations discussed with primary medical team.  -----------------------------------------------------------------------  Signed by:     Amado Nash, PA   [facility]    Contact Information:     [employer] Information   [facility]  Secure Chat (Group):   [facility] [place] [facility]    APP Spectralink:  [phone]    MD Spectralink :  [phone]  [phone]    After hours, non urgent consult line:  [phone]    After hours, physician on-call:  [phone] [facility]  Secure Chat (Group):   [facility] [place] [facility]    APP Spectralink:  [phone]    MD Spectralink :  [phone]      After hours, non urgent consult line:  [phone]    After hours, physician on-call:  [phone] [facility]  Secure Chat (Group):   [facility]    APP Spectralink:  [phone]    MD Spectralink :  [phone]      After hours, non urgent consult line:  [phone]    After hours, physician on-call:  [phone] [facility]  Secure Chat (Group):   [place] [facility]    APP Spectralink:  [phone]    MD Spectralink :  [phone]      After hours, non urgent consult line:  [phone]    After hours, physician on-call:  [phone]     This note was generated by the Dragon speech recognition and may contain errors or omissions not intended by the user. Grammatical errors, random word insertions, deletions, pronoun errors, and incomplete sentences are occasional consequences of this technology due to software limitations. Not all errors are caught or corrected. If there are questions or concerns about the content of this note or information contained within the body of this dictation, they should be addressed directly with the  author for clarification.

## 2023-07-06 NOTE — Progress Notes (Signed)
16:35 Per Amy Pitha. [employer] personnel, [place] came and turn off the ICD. Pacemaker is still working on V paced.

## 2023-07-06 NOTE — Progress Notes (Signed)
CRITICAL CARE  [facility]- Critical Care Note     ICU Transfer Note        Date Time: 07/06/23 9:14 PM  Patient Name: [name] [name]  Attending Physician: Allegra Grana, MD  Room: IC10/IC10-A   Admit Date: 06/29/2023  LOS: 6 days      Assessment/Plan:   End-stage cardiomyopathy, cardiogenic shock, treated with inotropic agent, IV diuretic infusion and vasodilator nitrate.   Overall no significant improvement and with worsening renal function.  Not a candidate for further treatment.  Referred for hospice per cardiology team.     Discharge planning with hospice.  Likely for tomorrow.       Impression and plan:   #Neuro: Alert and oriented    # Palliative care: Palliative was consulted patient currently DNR/DNI undergoing hospice evaluation and discharge planning for tomorrow.  DNR.    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  - Nephrology following  - Optimize diuresis  - Optimize electrolytes    #Infectious Disease (ID): No indication of infection at this time, will closely monitor.  Afebrile, no leukocytosis           #Hem/Onc: Anemia of chronic disease  Remote history of CLL    #Endocrine: Diabetes mellitus, continue Lantus and high-dose sliding scale insulin      DVT prophylaxis SCDs  GI prophylaxis on PPI  DNR/DNI allow natural death    Plan of care was discussed in extensive detail with the patient initially and then patient and wife at bedside.  Their questions answered to his satisfaction.  Patient medically optimized for transfer to medical floors for continuation of care.      Subjective:   No acute distress alert responsive out of bed to chair  Medications:   Scheduled Meds:  Current Facility-Administered Medications   Medication Dose Route Frequency    amiodarone  400 mg Oral BID    Followed by    Melene Muller ON 07/10/2023] amiodarone  400 mg Oral Daily    atorvastatin  80 mg Oral Daily    bumetanide  2 mg Intravenous BID    hydrALAZINE  75 mg Oral Q8H SCH    hydrocortisone   Rectal BID    insulin lispro  1-6 Units Subcutaneous QHS    insulin lispro  2-10 Units Subcutaneous TID AC    isosorbide dinitrate  10 mg Oral TID - Nitrate Free Interval    pantoprazole  40 mg Oral QAM AC    senna-docusate  1 tablet Oral BID         Continuous Infusions:         Physical Exam:     Vitals:    07/06/23 2000   BP: 114/78   Pulse:  69   Resp: 17   Temp:    SpO2: 96%   Temperature 97      Intake/Output Summary (Last 24 hours) at 07/06/2023 2114  Last data filed at 07/06/2023 2032  Gross per 24 hour   Intake 820 ml   Output 201 ml   Net 619 ml       General Appearance: Not acute distress alert  CV: Normal rate and rhythm with systolic murmur  Pulm: Few basilar crackles no wheezes.  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

## 2023-07-06 NOTE — Hospital Course (Signed)
Missed p.o. potassium

## 2023-07-07 ENCOUNTER — Inpatient Hospital Stay: Payer: Commercial Managed Care - POS

## 2023-07-07 LAB — WHOLE BLOOD GLUCOSE POCT
Whole Blood Glucose POCT: 90 mg/dL (ref 70–100)
Whole Blood Glucose POCT: 126 mg/dL — ABNORMAL HIGH (ref 70–100)

## 2023-07-07 MED ORDER — PANTOPRAZOLE SODIUM 40 MG PO TBEC
40.0000 mg | DELAYED_RELEASE_TABLET | Freq: Every day | ORAL | 3 refills | Status: DC
Start: 2023-07-07 — End: 2023-07-23

## 2023-07-07 MED ORDER — ISOSORBIDE DINITRATE 10 MG PO TABS
10.0000 mg | ORAL_TABLET | Freq: Three times a day (TID) | ORAL | 2 refills | Status: DC
Start: 2023-07-07 — End: 2023-07-23

## 2023-07-07 MED ORDER — BUMETANIDE 1 MG PO TABS
2.0000 mg | ORAL_TABLET | Freq: Two times a day (BID) | ORAL | 2 refills | Status: DC
Start: 2023-07-07 — End: 2023-07-23

## 2023-07-07 MED ORDER — HYDRALAZINE HCL 25 MG PO TABS
75.0000 mg | ORAL_TABLET | Freq: Three times a day (TID) | ORAL | 2 refills | Status: DC
Start: 2023-07-07 — End: 2023-07-23

## 2023-07-07 MED ORDER — AMIODARONE HCL 200 MG PO TABS
ORAL_TABLET | ORAL | 0 refills | Status: DC
Start: 2023-07-07 — End: 2023-07-23

## 2023-07-07 MED ORDER — LORAZEPAM 0.5 MG PO TABS
0.5000 mg | ORAL_TABLET | Freq: Once | ORAL | Status: AC
Start: 2023-07-07 — End: 2023-07-07
  Administered 2023-07-07: 0.5 mg via ORAL
  Filled 2023-07-07: qty 1

## 2023-07-07 NOTE — Progress Notes (Signed)
Edit AVS    AVS - DISCHARGE INSTRUCTIONS  [name] [name] MRN: [id number]     Acute on chronic congestive heart failure, unspecified heart failure type   06/29/2023 - 07/07/2023   Intensive Care [facility]   [phone]   AFTER VISIT SUMMARY (AVS)  Things To Do   Do      Pick up these medications from [facility]  amiodarone  bumetanide  hydrALAZINE  isosorbide dinitrate  pantoprazole   Read      Read these attachments  Assessing Your Heart, Heart Failure (English)  Heart Failure, Coping with (English)  Most Important    About Your Stay  Reason for your Hospital Admission:  Acute decompensated systolic congested heart failure        Instructions for after your discharge:  Please follow with advance heart failure service and [facility] hospice.                Your medications have changed   START taking:  amiodarone (PACERONE)   Start taking on: July 07, 2023  bumetanide [facility])    CHANGE how you take:  hydrALAZINE (APRESOLINE) -- medication strength, how much to take  isosorbide dinitrate (ISORDIL) -- medication strength, how much to take, when to take this  pantoprazole (PROTONIX) -- when to take this   STOP taking:  aspirin EC 81 MG EC tablet   insulin glargine 100 UNIT/ML injection (LANTUS)   metoprolol succinate XL 25 MG 24 hr tablet (TOPROL-XL)   niacin 500 MG CR tablet (NIASPAN)   spironolactone 25 MG tablet (ALDACTONE)   Torsemide 40 MG Tabs   Toujeo SoloStar 300 UNIT/ML injection pen (Insulin Glargine (1 Unit Dial))   Review your updated medication list below.  Doctor in charge of your hospital stay  Allegra Grana, MDPhone: [phone]  What's Next  What's Next         Follow up with [facility] [address]  [place] [address]  [phone]          Follow up with Marianna Fuss [facility]  [address]  [address]  [place] [address]  [phone]          Follow up with Zorita Pang in 1 week [facility]  [phone]  [address]  [place] [address]  [phone]     Immunizations Administered for This Admission   No immunizations on file.     You are allergic to the following  You are allergic to the following  Allergen Reactions   Allopurinol Edema     Discharge Medication List     suggestion  Medication Lists help reduce medication errors and help prevent harmful drug interactions. Please maintain and update your medication list and share it with your health care providers at every visit.     START taking these medications  START taking these medications   Instructions AM Noon PM Bed As Needed   amiodarone 200 MG tablet  Common Name: PACERONE  Start Date: July 07, 2023  For: Nonsustained Rapid Ventricular Heartbeat, LOADING DOSE TAPER  Signed by: Lawernce Ion    Take 2 tablets (400 mg) by mouth 2 (two) times daily for 2 days, THEN 2 tablets (400 mg) daily. Loading to maintenance dose taper.  Last given: July 07, 2023  8:51 AM        bumetanide 1 MG tablet  Common  Name: BUMEX  Signed by: Lawernce Ion    Take 2 tablets (2 mg) by mouth 2 (two) times daily  Last given: Ask your nurse or doctor          CHANGE how you take these medications  CHANGE how you take these medications   Instructions AM Noon PM Bed As Needed   hydrALAZINE 25 MG tablet  Common Name: APRESOLINE  Signed by: Lawernce Ion  What changed:  medication strength  how much to take    Take 3 tablets (75 mg) by mouth every 8 (eight) hours  Last given: July 07, 2023  2:53 PM        isosorbide dinitrate 10 MG tablet  Common Name: ISORDIL  Signed by: Lawernce Ion  What changed:  medication strength  how much to take  when to take this    Take 1 tablet (10 mg) by mouth 3 (three) times daily  Last given: July 07, 2023  2:53 PM        pantoprazole 40 MG tablet  Common Name: PROTONIX  For diagnoses: Gastrointestinal hemorrhage, unspecified gastrointestinal hemorrhage type  Signed by: Lawernce Ion  What changed: when  to take this    Take 1 tablet (40 mg) by mouth daily  Last given: July 07, 2023  8:45 AM          CONTINUE taking these medications  CONTINUE taking these medications   Instructions AM Noon PM Bed As Needed   atorvastatin 80 MG tablet  Common Name: LIPITOR  For diagnoses: Acute on chronic systolic congestive heart failure  Signed by: Nilda Riggs El-Haddad    Take 1 tablet (80 mg) by mouth daily  Last given: July 07, 2023  8:51 AM        B-D UF III MINI PEN NEEDLES 31G X 5 MM Misc  Generic drug: Insulin Pen Needle  For diagnoses: Type 2 diabetes mellitus with complication, with long-term current use of insulin  Signed by: Zorita Pang    Use as directed daily        empagliflozin 10 MG tablet  Common Name: Jardiance  For diagnoses: Type 2 diabetes mellitus with complication, with long-term current use of insulin  Signed by: Zorita Pang    Take 1 tablet (10 mg) by mouth daily        FreeStyle Libre 3 Sensor Misc  For diagnoses: Type 2 diabetes mellitus with complication, with long-term current use of insulin  Signed by: Zorita Pang    Use 1 Device every 14 (fourteen) days        insulin lispro (1 Unit Dial) 100 UNIT/ML pen-injector  Common Name: HumaLOG KwikPen  For diagnoses: Type 2 diabetes mellitus with complication, with long-term current use of insulin  Signed by: Zorita Pang    Inject 10 Units into the skin 3 (three) times daily before meals  Last given: Ask your nurse or doctor          STOP taking these medications  STOP taking these medications  aspirin EC 81 MG EC tablet      insulin glargine 100 UNIT/ML injection  Common Name: LANTUS      metoprolol succinate XL 25 MG 24 hr tablet  Common Name: TOPROL-XL      niacin 500 MG CR tablet  Common Name: NIASPAN      spironolactone 25 MG tablet  Common Name: ALDACTONE      Torsemide 40 MG Tabs  Toujeo SoloStar 300 UNIT/ML injection pen  Generic drug: Insulin Glargine (1 Unit Dial)               Where to pick up your medications    Pick up these  medications at [facility]  amiodarone  bumetanide  hydrALAZINE  isosorbide dinitrate  pantoprazole  Address: [address], [place] [address]   Hours: Monday - Friday 9AM to 7PM, Saturday - Sunday 11AM to 5PM   Phone: [phone]     Your Latest Vitals    Blood Pressure 111/75       BMI 27.03       Weight 199 lb 4.7 oz       Height 6'       Temperature (Temporal) 97.2 F       Pulse 67       Respiration 23       Oxygen Saturation 99%       BSA 2.14 m  Medical Records Request  [facility] is committed to ensuring that you have full and timely access to all of your medical records. Today's after visit summary (AVS) contains results of the studies that were performed and completed during your visit.       To receive a copy of your medical record, please visit our "Medical Records Request " site at: [email or web address].      To request a digital copy of your radiology imaging studies or upload your images from a CD, please visit our radiology specific site at:  [email or web address].  Opt In to Text Alerts  You can now receive text alerts related to your healthcare, including updates about your visits, billing notifications, care management, and more. Text START to [phone] to opt-in to receive text alerts from [facility]. Message and data rates may apply. Please review terms and conditions. [email or web address]     MyChart Sign Up      Status : Active      You currently have an active MyChart account which allows you to send secure messages to your doctor, view your test results, request prescription refills, request or   schedule appointments, and more.     If you have not logged in to MyChart for a while and have forgotten your user name or password please use the "Forgot MyChart Username" or "Forgot MyChart Password" links located next to the logon  boxes at  MyChart     If you have any questions regarding your results please contact your Physician (Doctor's Office).     *Remember, MyChart is NOT to be used for urgent needs. For medical emergencies, dial 911.  For same-day concerns, please contact your primary health care provider.   Attached Information  Assessing Your Heart, Heart Failure (English)    Heart Failure: Assessing Your Heart   You have a condition called heart failure. To assess your condition, your doctor will examine you, ask questions, and do some tests. Along with looking for signs of heart failure, the doctor looks for any other health problems that may have led to heart failure. The results of your evaluation will help your doctor form a treatment plan.   Health history and physical exam   Your visit will start with a review of your health history. Tell the doctor about any symptoms you have and about all medicines you take. Then you'll have a physical exam. Your doctor will listen to your heartbeat and your breathing. You'll also be checked for swelling (edema) in your  legs and neck. When you have fluid buildup or fluid in the lungs, it can be from congestive heart failure.   Diagnosing heart failure     These tests may be done to diagnose heart failure:  X-rays. These imaging tests show the size and shape of your heart. The pictures can also show fluid in your lungs.  An electrocardiogram (ECG). This shows the pattern of your heartbeat. Small pads (electrodes) are placed on your chest, arms, and legs. Wires connect the pads to the ECG machine. The ECG records your heart's electrical signals. This can give the doctor information about heart function and electrical activity in the heart.  An echocardiogram. This uses ultrasound waves to show the structure and movement of your heart muscle. This shows how well the heart pumps. It also shows the thickness of the heart walls, and if the heart is enlarged. It's one of the most useful,  noninvasive tests as it gives information about the heart's general function. This helps your provider make treatment decisions.  Lab tests. These check small amounts of blood or urine for signs of problems. A BNP (B-type natriuretic peptide) lab test can help diagnose and evaluate heart failure. The ventricles secrete more BNP when heart failure worsens. Lab tests can also give information about other heart problems or other illnesses such as diabetes, anemia, or kidney disease.  Your treatment plan   Based on the results of your assessment and tests, your doctor will make a treatment plan. This plan aims to ease some of your symptoms and help make you more comfortable. Your treatment plan may include:   Medicine to help your heart work better and improve your quality of life  Changes in what you eat and drink to help prevent fluid backup in your body  Weighing yourself daily and watching your symptoms to see how well your treatment is working  Exercise to help you stay healthy  Help to stop smoking  Support to help adjust to the changes  Referrals to other specialists  StayWell last reviewed this educational content on 07/19/2020   2000-2023 [employer], [place]. All rights reserved. This information is not intended as a substitute for professional medical care. Always follow your healthcare professional's instructions.           Attached Information  Heart Failure, Coping with (English)    Coping with Heart Failure  It's normal to feel sad or down at times when you're living with heart failure. Some medicines can also affect your mood. Following your treatment plan may seem difficult at times. If you feel overwhelmed, just focus on one day at a time. Don't be afraid to ask others for help when you need it.     Ways to feel better   Try not to withdraw from family and friends, even if you're finding it hard to talk to them. They can still be a good source of support. To feel better, you can also:   Spend time  doing things you enjoy. This may include taking part in a favorite hobby, meditating, praying, or spending time with people you care about. Find activities that make you happy. And make those a priority.  Share what you learn about heart failure with the people in your life.  Invite family members along when you visit your healthcare provider. This will help you feel supported. And it will help you discuss the care plan you've agreed upon with your provider.  Think about joining a support  group for people with heart failure. It may be easier to talk to people who know firsthand what you're going through. They can offer advice and share stories. You may want to ask loved ones to join you for a meeting.  Asking for help   Having heart failure doesn't mean that you have to feel bad all the time. Think about talking to your healthcare provider or a therapist if:   You feel worthless or helpless, or are thinking about suicide. These are warning signs of depression. Treatment can help you feel better. When depression is under control, your overall health may also improve.  You feel anxious about what will happen to your loved ones if your health gets worse.  Taking care of legal arrangements, such as a living will and durable power of attorney, can help you feel more secure about the future.  You feel stressed or alone. Social support helps reduce stress and helps you stick with your healthy lifestyle changes. Without social support, you may end up back in the hospital.  StayWell last reviewed this educational content on 07/19/2020   2000-2023 [employer], [place]. All rights reserved. This information is not intended as a substitute for professional medical care. Always follow your healthcare professional's instructions.                Heart Failure Discharge Instructions      Follow Up:   Heart failure is serious. It may take some time to make sure your medications are just right for you.  Please see your heart  doctor (cardiologist) within 3-5 days after leaving the hospital.  Then, be sure to see your heart doctor again within one month of leaving the hospital.   If we have not already made a follow-up appointment with your heart doctor or Primary Care Physician (primary care doctor or PCP), call to make an appointment with each of them as soon as you are home. Your PCP will help you manage your medicines and other illnesses to help prevent future health problems.   If you do not have a PCP, please call [phone] for assistance 24 hours a day, 7 days a week.     Cardiac Rehab  Your cardiologist wants you to go to cardiac rehab to help you recover. Cardiac rehab is a program that will help you get stronger and healthier. It also teaches you to make healthy lifestyle changes such as exercising and eating a heart-healthy diet.     Activity:       Unless otherwise instructed, gradually increase your activity.  Let your symptoms guide you.      Diet:    Follow a heart healthy diet low in saturated fat and cholesterol.  Limit salt (sodium) in your diet to less than 2000 mg daily (1 teaspoon salt = 2400 mg sodium).   Talk to your doctor before using a salt substitute, which may be high in potassium.     Fluid restriction:  Limit your daily fluid intake to 1.5 liters or 48 ounces.     Risk Factors:         If you smoke or use any tobacco products, you should quit.  If you use tobacco products and want help in quitting, call the QuitLine:  1-800-QUIT-NOW (3125059084).  Know your Zones!  Refer to the Heart Failure Zone information below to manage symptoms.     Weight Monitoring:    Rapid changes in weight, usually by 3 pounds within 24  hours, may mean you are retaining fluid, which will worsen your heart failure symptoms.  Weigh yourself each morning after going to the bathroom and before breakfast.    Place your scale on a section of bare floor without rug or carpeting to make sure you are getting your correct weight.    Record date, time, and weight in your notebook and bring it with you to appointments to review with your doctor.    Review the Heart Failure Zone instructions for when to call your doctor.                                                             Balance Billing Protection for Out-of-Network Services                Our patients are the reason for all we do: we want to improve and you can help!  You may receive a survey asking about your visit - this will come from [place] through postal mail or via email from our Product manager.  Please take the time to complete it; your valuable input will be used to recognize exceptional members of the care team and improve the quality of our service.  Thank you!

## 2023-07-07 NOTE — Plan of Care (Signed)
Problem: Everyday - Heart Failure  Goal: Stable Vital Signs and Fluid Balance  Outcome: Progressing  Flowsheets (Taken 07/07/2023 0229)  Stable Vital Signs and Fluid Balance:   Fluid Restriction   Assess for swelling/edema   Monitor, assess vital signs and telemetry per policy   Strict Intake/Output     Problem: Moderate/High Fall Risk Score >5  Goal: Patient will remain free of falls  Outcome: Progressing  Flowsheets (Taken 07/06/2023 2000)  High (Greater than 13):   HIGH-Bed alarm on at all times while patient in bed   HIGH-Utilize chair pad alarm for patient while in the chair   HIGH-Visual cue at entrance to patient's room   MOD-Place Fall Risk level on whiteboard in room   MOD-Include family in multidisciplinary POC discussions   MOD-Consider a move closer to Nurses Station     Problem: Compromised Friction/Shear  Goal: Friction and Shear Interventions  Outcome: Progressing  Flowsheets (Taken 07/06/2023 2000)  Friction and Shear Interventions: Pad bony prominences, Off load heels, HOB 30 degrees or less unless contraindicated, Consider: TAP seated positioning, Heel foams

## 2023-07-07 NOTE — Progress Notes (Signed)
Patient cleared for discharge to home today with hospice.   Patient has been accepted by [facility]. CM has coordinated discharge with Lyla Son, Hospice RN and [facility] Liaison [phone]. Patient to have the following DME delivered to the home today by 3 PM: wheelchair, FWW, BSC, urinal, over the bed table, home oxygen. This CM has also asked for a list of private hire caregivers to be given to wife that are commonly used in their area.     Discharge Summary and AVS have been faxed to [facility] @ [phone].   [facility] start of care time is scheduled between 6:30-7:00 PM this evening.     CM has spoken to spouse, [name] [name], [phone], several times today to coordinate. She will be at the home awaiting arrival of patient. Per spouse, she will be able to take off work to care for patient.     Caryn Bee, Director of CM, has approved payment for H&M stretcher Zenaida Niece with oxygen to transport patient to home. H&M has been scheduled for 1600 pick up and confirmed with dispatch.   H&M phone: [phone].     Discharge medications being filled by [facility]. There is a $0 co-pay. Medications will be delivered to bedside prior to discharge.     CM has met with patient, [name] [name], at bedside to inform him of discharge plan for today. He is agreeable.     No further CM needs identified at this time.    07/07/23 1527   Discharge Disposition   Patient preference/choice provided? Yes   Physical Discharge Disposition Home   Name of Hospice Agency [facility]   Mode of Transportation   (H&M stretcher [place]. 1600 pick up time)   Patient/Family/POA notified of transfer plan Yes   Patient agreeable to discharge plan/expected d/c date? Yes   Family/POA agreeable to discharge plan/expected d/c date? Yes   Bedside nurse notified of transport plan? Yes   Special requirements for patient during transport: Oxygen  (2 L)   Outpatient Services    Services Home hospice   CM Interventions   Follow up appointment scheduled? No   Reason no follow up scheduled? Family to schedule   Notified MD? Yes   Multidisciplinary rounds/family meeting before d/c? Yes   Medicare Checklist   Is this a Medicare patient? No

## 2023-07-07 NOTE — Progress Notes (Signed)
Patient left via stretcher with transport company at 1655. Belongings with patient are: underwear, socks, shoes, medications from pharmacy, and discharge instructions. RN educated patient regarding discharge instructions and expressed understanding. RN called patient's spouse Lajoyce Corners and updated her with discharge instructions and pick up time, she understood. Patient left with stable VS and on 2L oxygen.     Manuela Schwartz RN

## 2023-07-07 NOTE — Consults (Addendum)
Chaplain met patient in response to RN request/consult request as patient appears anxious this morning and unable to eat breakfast. Chaplain explored feelings of stress/anxiety- patient expressed worry about diagnosis, going home on hospice, and the inability to reconcile with his children. We explored how the patient might remain hopeful and find peace with his body's changes. Holding both. The patient thought daily prayers and allowing himself to let go of regrets would bring him ease. We agreed going home today is news to be grateful for. Patient requested chaplain return prior to discharge.     @3 :50pm: Chaplain met with patient to say goodbye and offer a blessing.

## 2023-07-07 NOTE — Plan of Care (Signed)
Problem: Pain interferes with ability to perform ADL  Goal: Pain at adequate level as identified by patient  Outcome: Progressing     Problem: Side Effects from Pain Analgesia  Goal: Patient will experience minimal side effects of analgesic therapy  Outcome: Progressing     Problem: Compromised Moisture  Goal: Moisture level Interventions  Outcome: Progressing     Problem: Compromised Activity/Mobility  Goal: Activity/Mobility Interventions  Outcome: Progressing     Problem: Compromised Sensory Perception  Goal: Sensory Perception Interventions  Outcome: Progressing     Problem: Compromised Nutrition  Goal: Nutrition Interventions  Outcome: Progressing     Problem: Compromised Friction/Shear  Goal: Friction and Shear Interventions  Outcome: Progressing     Problem: Moderate/High Fall Risk Score >5  Goal: Patient will remain free of falls  Outcome: Progressing     Problem: Everyday - Heart Failure  Goal: Stable Vital Signs and Fluid Balance  Outcome: Progressing  Goal: Mobility/Activity is Maintained at Optimal Level for Patient  Outcome: Progressing  Goal: Nutritional Intake is Adequate  Outcome: Progressing  Goal: Teaching-Using CHF Warning Zones and Educational Videos  Outcome: Progressing     Problem: Day of Discharge - Heart Failure  Goal: Discharge Education  Outcome: Progressing     Problem: Hemodynamic Status: Cardiac  Goal: Stable vital signs and fluid balance  Outcome: Progressing     Problem: Inadequate Tissue Perfusion  Goal: Adequate tissue perfusion will be maintained  Outcome: Progressing     Problem: Ineffective Gas Exchange  Goal: Effective breathing pattern  Outcome: Progressing

## 2023-07-07 NOTE — Discharge Instr - AVS First Page (Addendum)
Reason for your Hospital Admission:  Acute decompensated systolic congested heart failure      Instructions for after your discharge:  Please follow with advance heart failure service and [facility] hospice.

## 2023-07-07 NOTE — Discharge Summary (Signed)
CRITICAL CARE  [facility]   [facility]- Critical Care Note      Discharge Summary      Date Time: 07/07/23 11:57 AM  Patient Name: [name] [name]  Attending Physician: Allegra Grana, MD  Primary Care Physician: Zorita Pang, MD  Location/Room: IC10/IC10-A       Chief Complaint / Primary Reason for ICU evaluation :      Chief Complaint   Patient presents with    Chest Pain    Shortness of Breath    Cough          History of Presenting Illness:   Per Dr. 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  Patient was seen in the ED yesterday and was discharged to home.  Patient reports worsening shortness of breath and inability to sleep because he gets orthopneic when he lays flat.  Given symptoms patient came into the ED again for further evaluation.     Upon arrival to the ED patient was hemodynamically stable and afebrile.  Pulse ox was 93 thusly patient was started on oxygen for support.  Laboratory evaluation notable for BUN/creatinine 59 3.8, initial troponin was 58 and second troponin was 67.  H&H was 11.4 36.9 platelet count 94 proBNP was elevated greater than 14,000.  D-dimer was elevated at 0.94.  Rapid COVID and influenza test were negative.     Chest x-ray showed no acute findings.  EKG showed atrial sensed ventricular paced rhythm rate of 89.  Patient was given 1 dose of IV Lasix 40 mg and 1 full dose aspirin."    Hospital Course:   9/11 acute on chronic cardiogenic shock, transferred to ICU from PCU. Cath lab for swan placement. Dobutamine, amio and bumex gtt  PM: frequent PVCs, decrease Dobutamine  per Cardio, A-line inserted , Nipride started  9/12: went back up on dobutamine, and amiodarone, and nipride  started insulin gtt  9/13: insulin gtt off.  Weaning nitroprusside, OOB to chair  9/14: Nipride parameter SBP <130. Nipride gtt off. Increased insulin sliding scale, lantus added. CO/CI worsening, increased hydralazine from 50mg  to 75mg   PM: FICK improved overnight > 2.1 & 1.9  9/15: Bumex gtt d/c'd. Cr level increasing.  9/16 Palliative consult. DNR/AND. Swan and a-line removed. Drips off.       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    Coronary artery disease 2011    CORONARY STENT PLACEMENT FOLLOWED BY [facility]    echocardiogram 02/2016, 11/2016, 09/2017, 12/20/2017    GERD (gastroesophageal reflux disease)     Heart attack 2011    and 2014    Hyperlipemia     Hypertension  116/80    ICD (implantable 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     PLACED 2014  [facility]  LAST INTERROGATION April 01 2018 REPORT REQUESTED    Ischemic cardiomyopathy     EF 15% ON ECHO 09-20-2017 DR GARG IN EPIC    Nonischemic cardiomyopathy     NSTEMI (non-ST elevated myocardial infarction) 2012    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    Pacemaker 2014    MEDTRONIC ICD/PACEMAKER COMBO LAST INTERROGATION  12-12 2018    Pneumonia 09/2016    Primary cardiomyopathy     Ischemic cardiomyopathy EF 15% ON ECHO 09-20-2017 DR GARG IN EPIC    Renal insufficiency     Sepsis 08/2017    Syncope and collapse     PRIOR TO ICD/PACEMAKER PLACED 2014    Type 2 diabetes mellitus, controlled DX 1998    A1c ~6.0 (02/2023) FBS~110    Wheeze     PT SEE HIS PMD April 01 2018 RE THIS-TOLD TO SEE PMD BY [place]  [facility] JUNE 12 WHEN THEY SAW HIM FOR A CL         Past Surgical History:   Past Surgical History[1]    Family History:   Family History[2]    Social History:     Social History     Socioeconomic History    Marital status: Married     Spouse name: Teacher, music    Number of children: 0    Years of education: Not on file    Highest education level: Not on file   Occupational History    Occupation: Runner, 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[place], [place]    Tobacco Use    Smoking status: Never    Smokeless tobacco: Never   Vaping Use    Vaping status: Never Used   Substance and Sexual Activity    Alcohol use: Not Currently    Drug use: No    Sexual activity: Not Currently     Partners: Female   Other Topics Concern    Not on file   Social History Narrative    Not on file     Social Determinants of Health     Financial Resource Strain: Low Risk  (01/17/2023)    Overall Financial Resource Strain (CARDIA)     Difficulty of Paying Living Expenses: Not hard at all   Food Insecurity: No Food Insecurity (06/29/2023)    Hunger Vital Sign     Worried About Running Out of Food in the Last Year: Never true     Ran Out of Food in the Last Year: Never true   Transportation Needs: No Transportation Needs (06/29/2023)    PRAPARE - Therapist, art (Medical): No     Lack of Transportation (Non-Medical): No   Physical Activity: Insufficiently Active (01/17/2023)    Exercise Vital Sign     Days of Exercise per Week: 1 day     Minutes of Exercise per Session: 10 min   Stress: Stress Concern Present (01/17/2023)    [employer] of Occupational Health - Occupational Stress Questionnaire     Feeling of Stress : To some extent   Social Connections: Moderately Integrated (01/17/2023)    Social Connection and Isolation Panel [NHANES]     Frequency of Communication with Friends and Family: More than three times a week     Frequency of Social Gatherings with Friends and Family: Once a week     Attends Religious Services: More than 4 times per year  Active Member of Clubs or Organizations: No     Attends Banker Meetings: Never     Marital Status: Married   Catering manager Violence: Not At Risk (06/29/2023)    Humiliation, Afraid, Rape, and Kick questionnaire     Fear of Current or Ex-Partner: No     Emotionally Abused: No     Physically Abused: No     Sexually Abused: No   Housing Stability: Low Risk  (06/29/2023)    Housing Stability Vital Sign     Unable to Pay for Housing in the Last Year: No     Number of Times Moved in the Last Year: 1     Homeless in the Last Year: No       Allergies:   Allergies[3]        Medications:     Medications Prior to Admission   Medication Sig    atorvastatin (LIPITOR) 80 MG tablet Take 1 tablet (80 mg) by mouth daily    Continuous Blood Gluc Sensor (FreeStyle Libre 3 Sensor) Misc Use 1 Device every 14 (fourteen) days    empagliflozin (Jardiance) 10 MG tablet Take 1 tablet (10 mg) by mouth daily    insulin lispro, 1 Unit Dial, 100 UNIT/ML pen-injector Inject 10 Units into the skin 3 (three) times daily before meals    Insulin Pen Needle (B-D UF III MINI PEN NEEDLES) 31G X 5 MM Misc Use as directed daily    [DISCONTINUED] aspirin EC 81 MG EC tablet Take 1 tablet (81 mg) by mouth daily    [DISCONTINUED] hydrALAZINE (APRESOLINE) 50 MG tablet Take 1 tablet (50 mg) by mouth every 8 (eight) hours    [DISCONTINUED] insulin glargine 100 UNIT/ML injection Inject 33 Units into the skin nightly    [DISCONTINUED] isosorbide dinitrate (ISORDIL) 40 MG tablet Take 1 tablet (40 mg) by mouth 2 (two) times daily    [DISCONTINUED] metoprolol succinate XL (TOPROL-XL) 25 MG 24 hr tablet Take 1 tablet (25 mg) by mouth 2 (two) times daily    [DISCONTINUED] niacin (NIASPAN) 500 MG CR tablet Take 1 tablet (500 mg) by mouth 2 (two) times daily    [DISCONTINUED] pantoprazole (PROTONIX) 40 MG tablet Take 1 tablet (40 mg) by mouth 2 (two) times daily    [DISCONTINUED] spironolactone (ALDACTONE) 25 MG tablet TAKE 1/2 TABLET(12.5 MG) BY MOUTH  DAILY    [DISCONTINUED] torsemide 40 MG Tab Take 2 tablets (80 mg) by mouth daily    [DISCONTINUED] Toujeo SoloStar 300 UNIT/ML injection pen        Current Inpatient :  Current Facility-Administered Medications   Medication Dose Route Frequency    amiodarone  400 mg Oral BID    Followed by    Melene Muller ON 07/10/2023] amiodarone  400 mg Oral Daily    atorvastatin  80 mg Oral Daily    bumetanide  2 mg Intravenous BID    hydrALAZINE  75 mg Oral Q8H SCH    isosorbide dinitrate  10 mg Oral TID - Nitrate Free Interval    pantoprazole  40 mg Oral QAM AC       Home Medications :     Prior to Admission medications    Medication Sig Start Date End Date Taking? Authorizing Provider   atorvastatin (LIPITOR) 80 MG tablet Take 1 tablet (80 mg) by mouth daily 04/19/23  Yes El-Haddad, Hazim M, MD   Continuous Blood Gluc Sensor (FreeStyle Libre 3 Sensor) Misc Use 1 Device every 14 (fourteen) days  01/08/23  Yes Zorita Pang, MD   empagliflozin (Jardiance) 10 MG tablet Take 1 tablet (10 mg) by mouth daily 05/05/23  Yes Zorita Pang, MD   insulin lispro, 1 Unit Dial, 100 UNIT/ML pen-injector Inject 10 Units into the skin 3 (three) times daily before meals 05/05/23 04/29/24 Yes Zorita Pang, MD   Insulin Pen Needle (B-D UF III MINI PEN NEEDLES) 31G X 5 MM Misc Use as directed daily 01/08/23  Yes Zorita Pang, MD   aspirin EC 81 MG EC tablet Take 1 tablet (81 mg) by mouth daily  07/07/23 Yes [provider]   hydrALAZINE (APRESOLINE) 50 MG tablet Take 1 tablet (50 mg) by mouth every 8 (eight) hours 04/19/23 07/07/23 Yes El-Haddad, Hazim M, MD   insulin glargine 100 UNIT/ML injection Inject 33 Units into the skin nightly 05/05/23 07/07/23 Yes Zorita Pang, MD   isosorbide dinitrate (ISORDIL) 40 MG tablet Take 1 tablet (40 mg) by mouth 2 (two) times daily 03/09/23 07/07/23 Yes Landry, Tre J, PA   metoprolol succinate XL (TOPROL-XL) 25 MG 24 hr tablet Take 1 tablet (25 mg) by mouth 2 (two) times daily 02/26/23 07/07/23 Yes Landry, Tre J, PA    niacin (NIASPAN) 500 MG CR tablet Take 1 tablet (500 mg) by mouth 2 (two) times daily 03/17/23 07/07/23 Yes Zorita Pang, MD   pantoprazole (PROTONIX) 40 MG tablet Take 1 tablet (40 mg) by mouth 2 (two) times daily 05/05/23 07/07/23 Yes Zorita Pang, MD   spironolactone (ALDACTONE) 25 MG tablet TAKE 1/2 TABLET(12.5 MG) BY MOUTH DAILY 05/10/23 07/07/23 Yes Zorita Pang, MD   torsemide 40 MG Tab Take 2 tablets (80 mg) by mouth daily 02/13/23 07/07/23 Yes Asante, Thomasene Lot, MD   Nathen May SoloStar 300 UNIT/ML injection pen  04/07/23 07/07/23 Yes [provider]   amiodarone (PACERONE) 200 MG tablet Take 2 tablets (400 mg) by mouth 2 (two) times daily for 2 days, THEN 2 tablets (400 mg) daily. Loading to maintenance dose taper. 07/07/23 08/08/23  Lawernce Ion, MD   bumetanide (BUMEX) 1 MG tablet Take 2 tablets (2 mg) by mouth 2 (two) times daily 07/07/23   Lawernce Ion, MD   hydrALAZINE (APRESOLINE) 25 MG tablet Take 3 tablets (75 mg) by mouth every 8 (eight) hours 07/07/23   Lawernce Ion, MD   isosorbide dinitrate (ISORDIL) 10 MG tablet Take 1 tablet (10 mg) by mouth 3 (three) times daily 07/07/23   Lawernce Ion, MD   pantoprazole (PROTONIX) 40 MG tablet Take 1 tablet (40 mg) by mouth daily 07/07/23   Lawernce Ion, MD          Physical Exam:     Vitals:    07/07/23 1100   BP: 102/73   Pulse: 68   Resp: 20   Temp:    SpO2: 100%       Intake and Output Summary (Last 24 hours) at Date Time    Intake/Output Summary (Last 24 hours) at 07/07/2023 1157  Last data filed at 07/07/2023 1000  Gross per 24 hour   Intake 660 ml   Output 491 ml   Net 169 ml     General Appearance: Younger than stated age, very pleasant, alert and interactive and not confused   CV: ns1, ns2, 2/6 systolic ejection murmur in the right sternal border, no rubs no gallop  Pulm: Mild bibasilar crackles  Abd: Nontender, nondistended, positive bowel sounds  Extremities: cool lower extremities, +2 dorsalis pedis pulses, +2 pitting edema  Labs:       Labs (last  72 hours):  Recent Labs     07/05/23  1036 07/05/23  0500 07/04/23  2340   WBC  --  7.47  --    Hemoglobin 9.8* 9.6* 9.8*   Hematocrit 30.9* 29.9* 30.1*     No results for input(s): "PT", "INR", "PTT" in the last 72 hours. Recent Labs     07/05/23  1036 07/05/23  0546 07/04/23  2340   Sodium 134* 136 134*   Potassium 4.3 3.7 4.1   Chloride 102 103 103   CO2 18 20 19    BUN 71* 67* 66*   Creatinine 4.0* 3.8* 3.8*   Glucose 199* 135* 230*   Calcium 8.7 8.7 8.7   Magnesium 2.5 2.6 2.5                   Radiology / Imaging:       Imaging personally reviewed by me  including:     XR Chest AP Portable    Result Date: 07/07/2023   No evidence of acute cardiopulmonary disease. Darra Lis, MD 07/07/2023 8:01 AM    XR Chest AP Portable    Result Date: 07/06/2023  . Interval removal of Swan-Ganz catheter. Darra Lis, MD 07/06/2023 7:56 AM    XR Chest AP Portable    Result Date: 07/05/2023  Tip of the pulmonary arterial catheter is likely in the right main pulmonary artery. Shelly Flatten, MD 07/05/2023 7:58 AM    XR Chest AP Portable    Result Date: 07/04/2023  1.Improving pulmonary vascular congestion, suggestive of improving edema. Legrand Pitts, MD 07/04/2023 2:54 PM    XR Chest AP Portable    Result Date: 07/03/2023  Stable lines and tubes. Stable mild interstitial edema. Pankaj Dominica, MD 07/03/2023 8:03 PM    XR Chest AP Portable    Result Date: 07/03/2023  1.Stable position of right jugular Swan-Ganz catheter tip. 2.Stable mild pulmonary vascular congestion. Legrand Pitts, MD 07/03/2023 3:43 PM    XR Chest AP Portable    Result Date: 07/02/2023   PA catheter tip in good position. Mild peribronchial wall thickening centrally may indicate interstitial inflammation infection or congestion. No overt pulmonary edema. Charlott Rakes, MD 07/02/2023 11:48 AM    X-ray chest AP portable    Result Date: 07/01/2023  Right IJ Swan-Ganz catheter tip overlies the right pulmonary artery. Nonda Lou, MD 07/01/2023 7:47 AM    XR Chest AP  Portable    Result Date: 06/30/2023  1. Pulmonary artery catheter tip at the proximal right pulmonary artery. 2. CHF. Nelta Numbers, MD 06/30/2023 6:43 PM      Other ICU Data        Line, Drains,Airway :       [REMOVED] Peripheral Arterial Line 06/30/23 Left Radial (Removed)   Site Assessment Clean;Dry;Intact 07/05/23 1800   Line necessity reviewed? BP Monitoring 07/05/23 1800   Line Status Pulsatile blood flow 07/05/23 1800   Art Line Waveform Appropriate;Square wave test performed 07/05/23 1800   Art Line Interventions Zeroed and calibrated;Leveled;Connections checked and tightened;Flushed per protocol 07/05/23 1400   Color/Movement/Sensation Warm fingers/toes;Pink fingers/toes;Capillary refill less than 3 sec 07/05/23 1800   Dressing Type Transparent;Gauze 07/05/23 1800   Dressing Status Clean;Dry;Intact 07/05/23 1800   Dressing/Line Intervention Dressing changed 07/05/23 0800   Dressing Change Due 07/12/23 07/05/23 0800   Number of days: 5       [REMOVED] Venous Sheath Other (Comment) Right  Other (Comment) (Removed)   Site Assessment Clean;Dry;Intact 07/05/23 1800   Line Status Intact and in place;Flushed 07/05/23 1800   Dressing Occlusive 07/05/23 1800   Dressing Status Clean;Dry;Intact 07/05/23 1800   Dressing Change Due 07/07/23 07/04/23 2000   Color/Movement/Sensation Warm fingers/toes;Pink fingers/toes;Capillary refill less than 3 sec 07/05/23 1800   Number of days: 5       [REMOVED] Pulmonary Artery Catheter 06/30/23 Internal jugular Right (Removed)   Line necessity reviewed? Vasopressor administration/hemodynamics 07/05/23 1800   Site Assessment Clean;Dry;Intact 07/05/23 1800   Distal (PA) Port Flush Bag 07/05/23 1800   Right Ventricular Port Flushed;Capped 07/05/23 1800   Exposed catheter length/mark (cm) 49 cm 07/05/23 0800   Dressing Type Transparent;Antimicrobial impregnated 07/05/23 1800   Dressing Status Clean;Dry;Intact 07/05/23 1800   Dressing/Line Intervention Caps changed 06/30/23 1800   Biopatch  Used? ([facility] Only) No 07/05/23 1800   Curos Caps Used? ([facility] Only) Yes 07/05/23 1800   End Caps Free From Blood Yes 07/05/23 1800   Line Used For Blood Draw No 07/05/23 1800   Dressing Change Due 07/04/23 07/05/23 0800   Cap Change Due 07/07/23 07/05/23 0800   Number of days: 5                     Assessment/Plan:     ##Neuro: Alert and oriented  - Delirium precautions     # Palliative care: Palliative was consulted during icu stay. patient currently DNR/DNI pending hospice evaluation     #Cardio: Acute on chronic cardiogenic shock with history of EF of 15% status post biventricular ICD with severe TR and MR not a candidate for advanced heart failure interventions nor valvular intervention  - Goals of care done with the patient with cardiology as patient transitions to hospice   - Optimize goal-directed medical therapy with diuretics and afterload reduction with hydralazine and isosorbide dinitrate  Appreciate [facility] and advanced heart failure consult for recommendation of therapy.  Continue amiodarone, Bumex, hydralazine and Isordil    Addendum: 2:55 pm  Dr. Jerald Kief, advanced heart failure, updated the patient and answered his questions at the bedside.  He will continue to be followed as an outpatient.    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  - Nephrology following  - Optimize diuresis  - Optimize electrolytes     #Infectious Disease (ID): No indication of infection at this time, will closely monitor.  Afebrile, no leukocytosis           #Hem/Onc: Anemia of chronic disease  Remote history of CLL     #Endocrine: Diabetes mellitus,  decrease oral intake.  Advised to hold Lantus as an outpatient to avoid  hypoglycemia.     DVT prophylaxis SCDs  GI prophylaxis on PPI  DNR/DNI allow natural death      Code Status: do not resuscitate AND    Patient meets criteria for hospice and is discharged to [facility] in [place].     I have personally reviewed the patient's history and 24 hour interval events, along with vitals, labs, radiology images and nursing. So far today I have spent _41__  minutes providing discharge coordination and care for this patient excluding teaching and billable procedures, and not overlapping with any other providers.     *This note was generated by the Epic EMR system/ Dragon speech recognition and may contain inherent errors or omissions not intended by the user. Grammatical errors, random word insertions, deletions, pronoun errors and incomplete sentences are occasional consequences of this technology due to software limitations. Not all errors are caught or corrected. If there are questions or concerns about the content of this note or information contained within the body of this dictation they should be addressed directly with the author for clarification    Discharge Medications:   Amiodarone  Lipitor   Bumex  Hydralazine  Isosorbide  Protonix        Signed by: Lawernce Ion, MD  [id number], Bonnetta Barry, MD              [1]   Past Surgical History:  Procedure Laterality Date    BIV  11/10/2016    ICD METRONIC  UPGRADE     CARDIAC CATHETERIZATION  03/2010    CORONARY STENT PLACEMENT X 2 PER PT; LM normal, LAD 70-80% distal lesion at apex, 20% lesion mid CFX    CARDIAC CATHETERIZATION  12/2012    CARDIAC DEFIBRILLATOR PLACEMENT  12/2012    [facility]    CARDIAC PACEMAKER PLACEMENT  2014    MEDTRONIC ICD/PACEMAKER PLACED [facility]    CIRCUMCISION  AGE 72    COLONOSCOPY, DIAGNOSTIC (SCREENING)  2009    CORRECTION HAMMER TOE      duodenal ulcer  1973    STRESS RELATED IN SCHOOL    ECHOCARDIOGRAM, TRANSESOPHAGEAL N/A 04/21/2023    Procedure: ECHOCARDIOGRAM, TRANSESOPHAGEAL;  Surgeon: George Hugh, MD;  Location: [facility];  Service: Cardiology;  Laterality: N/A;  Probe #[id number]    ECHOCARDIOGRAM, TRANSTHORACIC  02/2016 11/2016 09/2017 12/2017    ECHOCARDIOGRAM, TRANSTHORACIC  02/2016,11/2016,12/20/2017    EGD N/A 08/15/2014    Procedure: EGD;  Surgeon: Colon Branch, MD;  Location[facility];  Service: Gastroenterology;  Laterality: N/A;  egd w/ bx    EGD  1975    EGD  02/11/2023    Procedure: EGD and clip placement;  Surgeon: Doyne Keel, MD;  Location: [facility];  Service: Gastroenterology;;    EGD, COLONOSCOPY N/A 04/04/2018    Procedure: EGD with bxs, COLONOSCOPY with polypectomy and clipping;  Surgeon: Doyne Keel, MD;  Location: [facility];  Service: Gastroenterology;  Laterality: N/A;    FRACTURE SURGERY  AGE 89     RT  ANKLE- CAST APPLIED    HERNIA REPAIR  AGE 72    LEFT INGUINAL HERNIA    MPI nuclear study  06/2016    orthopedic surgery  AGE 46    RT foot corrective - HAMMERTOE    REPAIR, HAMMERTOE      RIGHT HEART CATH Right 09/09/2022    Procedure: Right Heart Cath;  Surgeon: Burna Forts, MD;  Location: [facility];  Service: Cardiovascular;  Laterality: Right;  tx from FO    RIGHT HEART CATH Right 06/30/2023    Procedure: Right Heart Cath;  Surgeon: Belinda Fisher, MD;  [facility]: [facility];  Service: Cardiovascular;  Laterality: Right;    RIGHT HEART CATH ONLY INCL. CO AND SATS Right 12/22/2022    Procedure: Right Heart Cath ONLY incl. CO and Sats;  Surgeon: Day, Bertram Millard, MD;  Location: [facility];  Service: Cardiovascular;  Laterality: Right;  Right Heart Cath with leave in PA cath for milrinone initiation    TONSILLECTOMY AND ADENOIDECTOMY  1954    TUNNELED CATH PLACEMENT ([facility]) N/A 12/25/2022    Procedure: [facility];  Surgeon: Tamala Bari, MD;  Location: [facility];  Service: Interventional Radiology;  Laterality: N/A;    TUNNELED CATH REMOVAL ([facility]) Right 01/04/2023    Procedure: TUNNELED CATH  REMOVAL;  Surgeon: Rex Kras, MD;  Location: [facility];  Service: Interventional Radiology;  Laterality: Right;   [2]   Family History  Problem Relation Name Age of Onset    Breast cancer Mother Wilber Oliphant     Heart attack Mother Wilber Oliphant 61    Hypertension Mother Wilber Oliphant     Diabetes Mother Wilber Oliphant     Coronary artery disease Mother Wilber Oliphant     Diabetes Brother Denver Faster     Heart attack Father Keneth Etchells     Hypertension Father Younes Brocato    [3]   Allergies  Allergen Reactions    Allopurinol Edema

## 2023-07-08 NOTE — [facility] Notes (Signed)
Requesting final auth to be faxed to [phone] or call [phone]        Auth Number :  [id number]    Discharge Date -  07/07/2023  4:55 PM    ER ADMIT DATE AND TIME: 06/29/2023  5:52 PM  OBS ADMIT DATE: 06/29/2023  5:52 PM  IP ADMIT DATE: 06/29/2023  5:52 PM                NAME: Dragan Jaurequi             MR#: [id number]      Patient Address:  [address]  [place] [address]    Patient phone: [phone] (home)     PATIENT NAME: [name] [name], [name] [name]  DOB: [birth year]   PMH:  has a past medical history of Acute CHF, Acute systolic (congestive) heart failure (08/2017), CAD (coronary artery disease), Cardiomyopathy, CHF (congestive heart failure) (08/12/2014, 2013), Chronic obstructive pulmonary disease, 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  PSH:  has a past surgical history that includes duodenal ulcer (1973); orthopedic surgery (AGE 45); EGD (N/A, 08/15/2014); Circumcision (AGE 62); Fracture surgery (AGE 57); Hernia repair (AGE 62); COLONOSCOPY, DIAGNOSTIC (SCREENING) (2009); Tonsillectomy and adenoidectomy (1954); Cardiac defibrillator placement (12/2012); Cardiac pacemaker placement (2014); EGD (1975); EGD, COLONOSCOPY (N/A, 04/04/2018); ECHOCARDIOGRAM, TRANSTHORACIC (02/2016 11/2016 09/2017 12/2017); MPI nuclear study (06/2016); Correction hammer toe; BIV (11/10/2016); ECHOCARDIOGRAM, TRANSTHORACIC (02/2016,11/2016,12/20/2017); Cardiac catheterization  (03/2010); Cardiac catheterization (12/2012); Right Heart Cath (Right, 09/09/2022); Right Heart Cath ONLY incl. CO and Sats (Right, 12/22/2022); Tunneled Cath Placement (Permcath) (N/A, 12/25/2022); Tunneled Cath Removal (Permcath) (Right, 01/04/2023); EGD (02/11/2023); ECHOCARDIOGRAM, TRANSESOPHAGEAL (N/A, 04/21/2023); REPAIR, HAMMERTOE; and Right Heart Cath (Right, 06/30/2023).      DIAGNOSIS:     ICD-10-CM    1. Acute on chronic congestive heart failure, unspecified heart failure type  I50.9       2. Chronic kidney disease, unspecified CKD stage  N18.9       3. Thrombocytopenia  D69.6       4. Elevated blood pressure reading  R03.0       5. Acute on chronic systolic congestive heart failure  I50.23 Cardiac Cath Case Request     Cardiac Cath Case Request     Right Heart Cath     Right Heart Cath      6. Gastrointestinal hemorrhage, unspecified gastrointestinal hemorrhage type  K92.2 pantoprazole (PROTONIX) 40 MG tablet          Information sent by a [facility] Case Management Assistant

## 2023-07-12 DIAGNOSIS — I429 Cardiomyopathy, unspecified: Secondary | ICD-10-CM

## 2023-07-12 DIAGNOSIS — Z95810 Presence of automatic (implantable) cardiac defibrillator: Secondary | ICD-10-CM

## 2023-07-12 LAB — REMOTE CARDIAC DEVICE MONITORING
AF Burden Percentage: 0
ICD Shock Recent Count: 0
RV Pacing Percentage: 14.05

## 2023-07-13 ENCOUNTER — Encounter (INDEPENDENT_AMBULATORY_CARE_PROVIDER_SITE_OTHER): Payer: Self-pay | Admitting: Family Medicine

## 2023-07-14 ENCOUNTER — Encounter (INDEPENDENT_AMBULATORY_CARE_PROVIDER_SITE_OTHER): Payer: Self-pay | Admitting: Family Medicine

## 2023-07-16 ENCOUNTER — Other Ambulatory Visit (INDEPENDENT_AMBULATORY_CARE_PROVIDER_SITE_OTHER): Payer: Self-pay | Admitting: Family Medicine

## 2023-07-19 ENCOUNTER — Ambulatory Visit (INDEPENDENT_AMBULATORY_CARE_PROVIDER_SITE_OTHER): Payer: Commercial Managed Care - POS

## 2023-07-19 ENCOUNTER — Institutional Professional Consult (permissible substitution) (INDEPENDENT_AMBULATORY_CARE_PROVIDER_SITE_OTHER): Payer: Commercial Managed Care - POS | Admitting: Thoracic Surgery (Cardiothoracic Vascular Surgery)

## 2023-07-19 ENCOUNTER — Other Ambulatory Visit (INDEPENDENT_AMBULATORY_CARE_PROVIDER_SITE_OTHER): Payer: Commercial Managed Care - POS

## 2023-07-20 DEATH — deceased

## 2023-07-21 ENCOUNTER — Encounter (INDEPENDENT_AMBULATORY_CARE_PROVIDER_SITE_OTHER): Payer: Self-pay | Admitting: Family Medicine

## 2023-07-22 ENCOUNTER — Encounter (INDEPENDENT_AMBULATORY_CARE_PROVIDER_SITE_OTHER): Payer: Self-pay | Admitting: Cardiovascular Disease

## 2023-07-22 ENCOUNTER — Encounter (INDEPENDENT_AMBULATORY_CARE_PROVIDER_SITE_OTHER): Payer: Self-pay | Admitting: Interventional Cardiology

## 2023-07-22 ENCOUNTER — Encounter (INDEPENDENT_AMBULATORY_CARE_PROVIDER_SITE_OTHER): Payer: Self-pay | Admitting: Family Medicine

## 2023-07-30 ENCOUNTER — Telehealth (INDEPENDENT_AMBULATORY_CARE_PROVIDER_SITE_OTHER): Payer: Commercial Managed Care - POS

## 2023-08-11 ENCOUNTER — Encounter (INDEPENDENT_AMBULATORY_CARE_PROVIDER_SITE_OTHER): Payer: Self-pay | Admitting: Family Medicine

## 2023-08-25 ENCOUNTER — Ambulatory Visit (INDEPENDENT_AMBULATORY_CARE_PROVIDER_SITE_OTHER): Payer: Commercial Managed Care - POS | Admitting: Cardiovascular Disease

## 2023-08-25 ENCOUNTER — Encounter (INDEPENDENT_AMBULATORY_CARE_PROVIDER_SITE_OTHER): Payer: Commercial Managed Care - POS

## 2023-09-15 ENCOUNTER — Encounter (INDEPENDENT_AMBULATORY_CARE_PROVIDER_SITE_OTHER): Payer: Self-pay | Admitting: Family Medicine

## 2023-10-18 ENCOUNTER — Encounter (INDEPENDENT_AMBULATORY_CARE_PROVIDER_SITE_OTHER): Payer: Self-pay | Admitting: Family Medicine

## 2023-10-19 ENCOUNTER — Encounter (INDEPENDENT_AMBULATORY_CARE_PROVIDER_SITE_OTHER): Payer: Self-pay | Admitting: Family Medicine
